# Patient Record
Sex: Male | Born: 1965 | Race: White | Hispanic: No | Marital: Married | Smoking: Former smoker
Health system: Southern US, Community
[De-identification: ages and names within clinical notes are randomized; demographics above are authoritative.]

## PROBLEM LIST (undated history)

## (undated) DIAGNOSIS — F329 Major depressive disorder, single episode, unspecified: Secondary | ICD-10-CM

## (undated) DIAGNOSIS — E119 Type 2 diabetes mellitus without complications: Secondary | ICD-10-CM

## (undated) DIAGNOSIS — I10 Essential (primary) hypertension: Secondary | ICD-10-CM

## (undated) DIAGNOSIS — M4800 Spinal stenosis, site unspecified: Secondary | ICD-10-CM

## (undated) DIAGNOSIS — F32A Depression, unspecified: Secondary | ICD-10-CM

## (undated) HISTORY — PX: CARPAL TUNNEL RELEASE: SHX101

## (undated) HISTORY — PX: TESTICLE SURGERY: SHX794

## (undated) HISTORY — PX: MENISCUS REPAIR: SHX5179

## (undated) HISTORY — PX: VASECTOMY: SHX75

---

## 2000-12-22 ENCOUNTER — Encounter: Payer: Self-pay | Admitting: *Deleted

## 2000-12-22 ENCOUNTER — Emergency Department (HOSPITAL_COMMUNITY): Admission: EM | Admit: 2000-12-22 | Discharge: 2000-12-22 | Payer: Self-pay | Admitting: *Deleted

## 2004-12-03 ENCOUNTER — Inpatient Hospital Stay (HOSPITAL_COMMUNITY): Admission: EM | Admit: 2004-12-03 | Discharge: 2004-12-05 | Payer: Self-pay | Admitting: *Deleted

## 2004-12-11 ENCOUNTER — Ambulatory Visit (HOSPITAL_COMMUNITY): Admission: RE | Admit: 2004-12-11 | Discharge: 2004-12-11 | Payer: Self-pay | Admitting: *Deleted

## 2004-12-24 ENCOUNTER — Ambulatory Visit (HOSPITAL_COMMUNITY): Admission: RE | Admit: 2004-12-24 | Discharge: 2004-12-24 | Payer: Self-pay | Admitting: *Deleted

## 2014-08-14 ENCOUNTER — Encounter (HOSPITAL_COMMUNITY): Payer: Self-pay | Admitting: *Deleted

## 2014-08-14 ENCOUNTER — Emergency Department (HOSPITAL_COMMUNITY)
Admission: EM | Admit: 2014-08-14 | Discharge: 2014-08-14 | Disposition: A | Discharge status: Home / Self Care | Payer: BC Managed Care – PPO | Attending: Emergency Medicine | Admitting: Emergency Medicine

## 2014-08-14 DIAGNOSIS — Z8739 Personal history of other diseases of the musculoskeletal system and connective tissue: Secondary | ICD-10-CM | POA: Diagnosis not present

## 2014-08-14 DIAGNOSIS — Z23 Encounter for immunization: Secondary | ICD-10-CM | POA: Insufficient documentation

## 2014-08-14 DIAGNOSIS — Y998 Other external cause status: Secondary | ICD-10-CM | POA: Insufficient documentation

## 2014-08-14 DIAGNOSIS — Z7982 Long term (current) use of aspirin: Secondary | ICD-10-CM | POA: Insufficient documentation

## 2014-08-14 DIAGNOSIS — Z87891 Personal history of nicotine dependence: Secondary | ICD-10-CM | POA: Diagnosis not present

## 2014-08-14 DIAGNOSIS — Z79899 Other long term (current) drug therapy: Secondary | ICD-10-CM | POA: Insufficient documentation

## 2014-08-14 DIAGNOSIS — E119 Type 2 diabetes mellitus without complications: Secondary | ICD-10-CM | POA: Diagnosis not present

## 2014-08-14 DIAGNOSIS — W260XXA Contact with knife, initial encounter: Secondary | ICD-10-CM | POA: Diagnosis not present

## 2014-08-14 DIAGNOSIS — F10129 Alcohol abuse with intoxication, unspecified: Secondary | ICD-10-CM | POA: Insufficient documentation

## 2014-08-14 DIAGNOSIS — S61412A Laceration without foreign body of left hand, initial encounter: Secondary | ICD-10-CM | POA: Diagnosis present

## 2014-08-14 DIAGNOSIS — Y93G1 Activity, food preparation and clean up: Secondary | ICD-10-CM | POA: Diagnosis not present

## 2014-08-14 DIAGNOSIS — Y9289 Other specified places as the place of occurrence of the external cause: Secondary | ICD-10-CM | POA: Insufficient documentation

## 2014-08-14 HISTORY — DX: Type 2 diabetes mellitus without complications: E11.9

## 2014-08-14 HISTORY — DX: Spinal stenosis, site unspecified: M48.00

## 2014-08-14 MED ORDER — LIDOCAINE HCL (PF) 2 % IJ SOLN
INTRAMUSCULAR | Status: AC
  Filled 2014-08-14: qty 10

## 2014-08-14 MED ORDER — TETANUS-DIPHTH-ACELL PERTUSSIS 5-2.5-18.5 LF-MCG/0.5 IM SUSP
0.5000 mL | Freq: Once | INTRAMUSCULAR | Status: AC
  Administered 2014-08-14: 0.5 mL via INTRAMUSCULAR
  Filled 2014-08-14: qty 0.5

## 2014-08-14 MED ORDER — CEPHALEXIN 500 MG PO CAPS: 500.0000 mg | ORAL_CAPSULE | Freq: Three times a day (TID) | ORAL | Status: DC

## 2014-08-14 NOTE — ED Provider Notes (Signed)
CSN: [id number]     Arrival date & time 08/14/14  1937 History   First MD Initiated Contact with Patient 08/14/14 2015     Chief Complaint  Patient presents with  . Laceration     (Consider location/radiation/quality/duration/timing/severity/associated sxs/prior Treatment) Patient is a 48 y.o. male presenting with skin laceration. The history is provided by the patient.  Laceration Location:  Hand Hand laceration location:  L hand Depth:  Through underlying tissue Quality: jagged   Time since incident:  1 hour Laceration mechanism:  Knife Pain details:    Quality:  Aching   Severity:  Moderate   Timing:  Constant   Progression:  Unchanged Foreign body present:  Unable to specify Relieved by:  None tried Worsened by:  Nothing tried Tetanus status:  Out of date  John Ware is a 48 y.o. male who present to the ED with with his wife for a 4 cm laceration to the left hand. He was cleaning a deer and the knife slipped and went into the space between the left thumb and index finger. Patient has been drinking alcohol tonight.   Past Medical History  Diagnosis Date  . Diabetes mellitus without complication   . Spinal stenosis    History reviewed. No pertinent past surgical history. History reviewed. No pertinent family history. History  Substance Use Topics  . Smoking status: Former Games developermoker  . Smokeless tobacco: Not on file  . Alcohol Use: Yes    Review of Systems Negative except as stated in HPI   Allergies  Review of patient's allergies indicates no known allergies.  Home Medications   Prior to Admission medications   Medication Sig Start Date End Date Taking? Authorizing Provider  amLODipine (NORVASC) 5 MG tablet Take 5 mg by mouth daily. 07/20/14  Yes Historical Provider, MD  aspirin 325 MG tablet Take 325 mg by mouth as needed for mild pain.   Yes Historical Provider, MD  buPROPion (WELLBUTRIN XL) 150 MG 24 hr tablet Take 150 mg by mouth 2 (two) times daily.  06/17/14  Yes Historical Provider, MD  citalopram (CELEXA) 20 MG tablet Take 20 mg by mouth daily. 08/01/14  Yes Historical Provider, MD  HYDROcodone-acetaminophen (NORCO) 10-325 MG per tablet Take 1 tablet by mouth 3 (three) times daily as needed for moderate pain.  07/16/14  Yes Historical Provider, MD  metFORMIN (GLUCOPHAGE) 500 MG tablet Take 500 mg by mouth 3 (three) times daily. 07/01/14  Yes Historical Provider, MD  naproxen sodium (ALEVE) 220 MG tablet Take 440 mg by mouth 2 (two) times daily as needed (Pain).   Yes Historical Provider, MD  ramipril (ALTACE) 10 MG capsule Take 10 mg by mouth daily. 07/20/14  Yes Historical Provider, MD  cephALEXin (KEFLEX) 500 MG capsule Take 1 capsule (500 mg total) by mouth 3 (three) times daily. 08/14/14   Yasmin Bronaugh Orlene OchM Kain Milosevic, NP   BP 114/81 mmHg  Pulse 87  Temp(Src) 98.2 F (36.8 C) (Oral)  Resp 20  Ht 5' 10.5" (1.791 m)  Wt 215 lb (97.523 kg)  BMI 30.40 kg/m2  SpO2 98% Physical Exam  Constitutional: He is oriented to person, place, and time. He appears well-developed and well-nourished. No distress.  HENT:  Head: Normocephalic and atraumatic.  Eyes: EOM are normal.  Neck: Neck supple.  Cardiovascular: Normal rate.   Pulmonary/Chest: Effort normal.  Abdominal: Soft. There is no tenderness.  Musculoskeletal: Normal range of motion.       Left hand: He exhibits tenderness and laceration.  He exhibits normal range of motion and normal capillary refill. Normal sensation noted. Decreased strength noted.       Hands: Deep laceration between thumb and index finger. Good strength and touch sensation, no neuro deficits.   Neurological: He is alert and oriented to person, place, and time. No cranial nerve deficit.  Skin: Skin is warm and dry.  Psychiatric:  intoxicated  Nursing note and vitals reviewed.   ED Course  Procedures  LACERATION REPAIR Performed by: Sylvain Hasten Authorized by: Heron Pitcock Consent: Verbal consent obtained. Risks and benefits:  risks, benefits and alternatives were discussed Consent given by: patient Patient identity confirmed: provided demographic data Prepped and Draped in normal sterile fashion  Soaked in NSS and Betadine  Wound explored  Laceration Location: left hand  Laceration Length: 4 cm  No Foreign Bodies seen or palpated  Anesthesia: local infiltration  Local anesthetic: lidocaine 2% without epinephrine  Anesthetic total: 3 ml  Irrigation method: syringe Amount of cleaning: standard  Skin closure: 4-0 prolene  Number of sutures: 5  Technique: interrupted  Patient tolerance: Patient tolerated the procedure well with no immediate complications.  MDM  48 y.o. male with laceration to the left hand s/p injury. Since the wound is deep will start antibiotics and he will take ibuprofen as needed for pain. Stable for discharge without neurovascular deficits.  Discussed with the patient and all questioned fully answered. He will follow up in 10 days for suture removal or sooner if any problems arise.    Medication List    TAKE these medications        cephALEXin 500 MG capsule  Commonly known as:  KEFLEX  Take 1 capsule (500 mg total) by mouth 3 (three) times daily.      ASK your doctor about these medications        ALEVE 220 MG tablet  Generic drug:  naproxen sodium  Take 440 mg by mouth 2 (two) times daily as needed (Pain).     amLODipine 5 MG tablet  Commonly known as:  NORVASC  Take 5 mg by mouth daily.     aspirin 325 MG tablet  Take 325 mg by mouth as needed for mild pain.     buPROPion 150 MG 24 hr tablet  Commonly known as:  WELLBUTRIN XL  Take 150 mg by mouth 2 (two) times daily.     citalopram 20 MG tablet  Commonly known as:  CELEXA  Take 20 mg by mouth daily.     HYDROcodone-acetaminophen 10-325 MG per tablet  Commonly known as:  NORCO  Take 1 tablet by mouth 3 (three) times daily as needed for moderate pain.     metFORMIN 500 MG tablet  Commonly known as:   GLUCOPHAGE  Take 500 mg by mouth 3 (three) times daily.     ramipril 10 MG capsule  Commonly known as:  ALTACE  Take 10 mg by mouth daily.         Final diagnoses:  Laceration of hand, left, initial encounter      [facility] Vernice Bowker, NP 08/14/14 2148  Layla MawKristen N Ward, DO 08/15/14 0022

## 2014-08-14 NOTE — ED Notes (Addendum)
Pt has a laceration to the left hand between his thumb and index finger that actually looks like he poked a whole in hand. Pt was cutting a deer. Pt staggered into triage admits to drinking tonight.

## 2014-11-24 ENCOUNTER — Other Ambulatory Visit: Payer: Self-pay | Admitting: Neurological Surgery

## 2014-12-06 NOTE — Progress Notes (Signed)
LEFT MESSAGE ON VOICEMAIL FOR JESSICA RE: ORDERS NEEDING SIGNED.

## 2014-12-06 NOTE — Pre-Procedure Instructions (Signed)
John Ware  12/06/2014   Your procedure is scheduled on:   Tuesday  12/13/14  [facility].  Call this number if you have problems the morning of surgery: [phone]   Remember:   Do not eat food or drink liquids after midnight.   Take these medicines the morning of surgery with A SIP OF WATER:   AMLODIPINE(NORVASC), BUPROPION (WELLBUTRIN), CITALOPRAM(CELEXA), OXYCONTIN IF NEEDED      (STOP NAPROXEN/ALEVE )   Do not wear jewelry, make-up or nail polish.  Do not wear lotions, powders, or perfumes. You may wear deodorant.  Do not shave 48 hours prior to surgery. Men may shave face and neck.  Do not bring valuables to the hospital.  [facility] is not responsible                  for any belongings or valuables.               Contacts, dentures or bridgework may not be worn into surgery.  Leave suitcase in the car. After surgery it may be brought to your room.  For patients admitted to the hospital, discharge time is determined by your                treatment team.               Patients discharged the day of surgery will not be allowed to drive  home.  Name and phone number of your driver:   Special Instructions: [place] - Preparing for Surgery  Before surgery, you can play an important role.  Because skin is not sterile, your skin needs to be as free of germs as possible.  You can reduce the number of germs on you skin by washing with CHG (chlorahexidine gluconate) soap before surgery.  CHG is an antiseptic cleaner which kills germs and bonds with the skin to continue killing germs even after washing.  Please DO NOT use if you have an allergy to CHG or antibacterial soaps.  If your skin becomes reddened/irritated stop using the CHG and inform your nurse when you arrive at Short Stay.  Do not shave (including legs and underarms) for at least 48 hours prior to the first CHG shower.  You may shave your face.  Please follow these  instructions carefully:   1.  Shower with CHG Soap the night before surgery and the                                morning of Surgery.  2.  If you choose to wash your hair, wash your hair first as usual with your       normal shampoo.  3.  After you shampoo, rinse your hair and body thoroughly to remove the                      Shampoo.  4.  Use CHG as you would any other liquid soap.  You can apply chg directly       to the skin and wash gently with scrungie or a clean washcloth.  5.  Apply the CHG Soap to your body ONLY FROM THE NECK DOWN.        Do not use on open wounds or open sores.  Avoid contact with your eyes,       ears, mouth  and genitals (private parts).  Wash genitals (private parts)       with your normal soap.  6.  Wash thoroughly, paying special attention to the area where your surgery        will be performed.  7.  Thoroughly rinse your body with warm water from the neck down.  8.  DO NOT shower/wash with your normal soap after using and rinsing off       the CHG Soap.  9.  Pat yourself dry with a clean towel.            10.  Wear clean pajamas.            11.  Place clean sheets on your bed the night of your first shower and do not        sleep with pets.  Day of Surgery  Do not apply any lotions/deoderants the morning of surgery.  Please wear clean clothes to the hospital/surgery center.     Please read over the following fact sheets that you were given: Pain Booklet, Coughing and Deep Breathing, MRSA Information and Surgical Site Infection Prevention

## 2014-12-07 ENCOUNTER — Encounter (HOSPITAL_COMMUNITY): Payer: Self-pay

## 2014-12-07 ENCOUNTER — Encounter (HOSPITAL_COMMUNITY)
Admission: RE | Admit: 2014-12-07 | Discharge: 2014-12-07 | Disposition: A | Discharge status: Home / Self Care | Payer: BLUE CROSS/BLUE SHIELD | Source: Ambulatory Visit | Attending: Neurological Surgery | Admitting: Neurological Surgery

## 2014-12-07 ENCOUNTER — Other Ambulatory Visit: Payer: Self-pay

## 2014-12-07 DIAGNOSIS — Z01812 Encounter for preprocedural laboratory examination: Secondary | ICD-10-CM | POA: Diagnosis not present

## 2014-12-07 DIAGNOSIS — Z01810 Encounter for preprocedural cardiovascular examination: Secondary | ICD-10-CM | POA: Diagnosis not present

## 2014-12-07 HISTORY — DX: Depression, unspecified: F32.A

## 2014-12-07 HISTORY — DX: Major depressive disorder, single episode, unspecified: F32.9

## 2014-12-07 HISTORY — DX: Essential (primary) hypertension: I10

## 2014-12-07 LAB — CBC
HCT: 42.9 % (ref 39.0–52.0)
Hemoglobin: 14.7 g/dL (ref 13.0–17.0)
MCH: 30.9 pg (ref 26.0–34.0)
MCHC: 34.3 g/dL (ref 30.0–36.0)
MCV: 90.3 fL (ref 78.0–100.0)
Platelets: 235 10*3/uL (ref 150–400)
RBC: 4.75 MIL/uL (ref 4.22–5.81)
RDW: 12.3 % (ref 11.5–15.5)
WBC: 6.9 10*3/uL (ref 4.0–10.5)

## 2014-12-07 LAB — SURGICAL PCR SCREEN
MRSA, PCR: NEGATIVE
Staphylococcus aureus: POSITIVE — AB

## 2014-12-07 LAB — BASIC METABOLIC PANEL
Anion gap: 10 (ref 5–15)
BUN: 15 mg/dL (ref 6–23)
CO2: 29 mmol/L (ref 19–32)
Calcium: 9.4 mg/dL (ref 8.4–10.5)
Chloride: 98 mmol/L (ref 96–112)
Creatinine, Ser: 0.93 mg/dL (ref 0.50–1.35)
GFR calc Af Amer: >90 mL/min (ref >90)
GFR calc non Af Amer: >90 mL/min (ref >90)
Glucose, Bld: 139 mg/dL — ABNORMAL HIGH (ref 70–99)
Potassium: 4.4 mmol/L (ref 3.5–5.1)
Sodium: 137 mmol/L (ref 135–145)

## 2014-12-07 NOTE — Progress Notes (Signed)
LEFT MESSAGE ON ANSWERING MACHINE FOR PATIENT TO CALL BACK.  CALLED [facility]. INTO [facility].  [phone]

## 2014-12-08 NOTE — Progress Notes (Signed)
Anesthesia Chart Review:  Patient is a 49 year old male scheduled for L3-4, L4-5, L5-S1 laminectomy on 12/13/14 by Dr. Danielle DessElsner.  History includes former smoker, DM2, HTN, depression, spinal stenosis. BMI is consistent with obesity.  PCP is listed as Prudy FeelerAngel Jones, PA-C.  12/07/14 EKG: NSR, poor r wave progression, possible anterior infarct (age undetermined). There are no comparison EKGs. (Patient denied prior EKG.) No CV symptoms documented from his PAT visit.  Preoperative labs noted.   If no acute changes then I would anticipate that he could proceed as planned.  Velna Ochsllison Angela Vazguez, PA-C [facility]iology Phone [phone] 12/08/2014 10:08 AM

## 2014-12-12 MED ORDER — CEFAZOLIN SODIUM-DEXTROSE 2-3 GM-% IV SOLR
2.0000 g | INTRAVENOUS | Status: AC
  Administered 2014-12-13: 2 g via INTRAVENOUS
  Filled 2014-12-12: qty 50

## 2014-12-13 ENCOUNTER — Encounter (HOSPITAL_COMMUNITY): Payer: Self-pay | Admitting: *Deleted

## 2014-12-13 ENCOUNTER — Encounter (HOSPITAL_COMMUNITY)
Admission: RE | Disposition: A | Discharge status: Home / Self Care | Payer: Self-pay | Source: Ambulatory Visit | Attending: Neurological Surgery

## 2014-12-13 ENCOUNTER — Inpatient Hospital Stay (HOSPITAL_COMMUNITY): Payer: BLUE CROSS/BLUE SHIELD | Admitting: Vascular Surgery

## 2014-12-13 ENCOUNTER — Observation Stay (HOSPITAL_COMMUNITY)
Admission: RE | Admit: 2014-12-13 | Discharge: 2014-12-14 | Disposition: A | Discharge status: Home / Self Care | Payer: BLUE CROSS/BLUE SHIELD | Source: Ambulatory Visit | Attending: Neurological Surgery | Admitting: Neurological Surgery

## 2014-12-13 ENCOUNTER — Inpatient Hospital Stay (HOSPITAL_COMMUNITY): Payer: BLUE CROSS/BLUE SHIELD | Admitting: Anesthesiology

## 2014-12-13 ENCOUNTER — Inpatient Hospital Stay (HOSPITAL_COMMUNITY): Payer: BLUE CROSS/BLUE SHIELD

## 2014-12-13 DIAGNOSIS — F329 Major depressive disorder, single episode, unspecified: Secondary | ICD-10-CM | POA: Insufficient documentation

## 2014-12-13 DIAGNOSIS — M48061 Spinal stenosis, lumbar region without neurogenic claudication: Secondary | ICD-10-CM | POA: Diagnosis present

## 2014-12-13 DIAGNOSIS — Z87891 Personal history of nicotine dependence: Secondary | ICD-10-CM | POA: Diagnosis not present

## 2014-12-13 DIAGNOSIS — Z6833 Body mass index (BMI) 33.0-33.9, adult: Secondary | ICD-10-CM | POA: Diagnosis not present

## 2014-12-13 DIAGNOSIS — E119 Type 2 diabetes mellitus without complications: Secondary | ICD-10-CM | POA: Diagnosis not present

## 2014-12-13 DIAGNOSIS — I10 Essential (primary) hypertension: Secondary | ICD-10-CM | POA: Insufficient documentation

## 2014-12-13 DIAGNOSIS — M5126 Other intervertebral disc displacement, lumbar region: Secondary | ICD-10-CM

## 2014-12-13 DIAGNOSIS — M4726 Other spondylosis with radiculopathy, lumbar region: Principal | ICD-10-CM | POA: Insufficient documentation

## 2014-12-13 HISTORY — PX: LUMBAR LAMINECTOMY/DECOMPRESSION MICRODISCECTOMY: SHX5026

## 2014-12-13 LAB — GLUCOSE, CAPILLARY
Glucose-Capillary: 133 mg/dL — ABNORMAL HIGH (ref 70–99)
Glucose-Capillary: 122 mg/dL — ABNORMAL HIGH (ref 70–99)
Glucose-Capillary: 142 mg/dL — ABNORMAL HIGH (ref 70–99)
Glucose-Capillary: 151 mg/dL — ABNORMAL HIGH (ref 70–99)

## 2014-12-13 SURGERY — LUMBAR LAMINECTOMY/DECOMPRESSION MICRODISCECTOMY 3 LEVELS
Anesthesia: General | Site: Spine Lumbar

## 2014-12-13 MED ORDER — KETOROLAC TROMETHAMINE 30 MG/ML IJ SOLN
INTRAMUSCULAR | Status: AC
  Filled 2014-12-13: qty 1

## 2014-12-13 MED ORDER — GLYCOPYRROLATE 0.2 MG/ML IJ SOLN
INTRAMUSCULAR | Status: DC | PRN
  Administered 2014-12-13: 0.4 mg via INTRAVENOUS
  Administered 2014-12-13: 0.2 mg via INTRAVENOUS

## 2014-12-13 MED ORDER — CEFAZOLIN SODIUM 1-5 GM-% IV SOLN
1.0000 g | Freq: Three times a day (TID) | INTRAVENOUS | Status: AC
  Administered 2014-12-13 – 2014-12-14 (×2): 1 g via INTRAVENOUS
  Filled 2014-12-13 (×2): qty 50

## 2014-12-13 MED ORDER — OXYCODONE HCL 5 MG PO TABS
ORAL_TABLET | ORAL | Status: AC
  Filled 2014-12-13: qty 1

## 2014-12-13 MED ORDER — LACTATED RINGERS IV SOLN
INTRAVENOUS | Status: DC
  Administered 2014-12-13: 10:00:00 via INTRAVENOUS

## 2014-12-13 MED ORDER — FLEET ENEMA 7-19 GM/118ML RE ENEM
1.0000 | ENEMA | Freq: Once | RECTAL | Status: AC | PRN
  Filled 2014-12-13: qty 1

## 2014-12-13 MED ORDER — MENTHOL 3 MG MT LOZG: 1.0000 | LOZENGE | OROMUCOSAL | Status: DC | PRN

## 2014-12-13 MED ORDER — KETOROLAC TROMETHAMINE 15 MG/ML IJ SOLN
15.0000 mg | Freq: Four times a day (QID) | INTRAMUSCULAR | Status: DC
  Administered 2014-12-13 – 2014-12-14 (×3): 15 mg via INTRAVENOUS
  Filled 2014-12-13 (×4): qty 1

## 2014-12-13 MED ORDER — SODIUM CHLORIDE 0.9 % IR SOLN
Status: DC | PRN
  Administered 2014-12-13: 13:00:00

## 2014-12-13 MED ORDER — CITALOPRAM HYDROBROMIDE 20 MG PO TABS
20.0000 mg | ORAL_TABLET | Freq: Every day | ORAL | Status: DC
  Filled 2014-12-13: qty 1

## 2014-12-13 MED ORDER — GLYCOPYRROLATE 0.2 MG/ML IJ SOLN
INTRAMUSCULAR | Status: AC
  Filled 2014-12-13: qty 1

## 2014-12-13 MED ORDER — POLYETHYLENE GLYCOL 3350 17 G PO PACK
17.0000 g | PACK | Freq: Every day | ORAL | Status: DC | PRN
  Filled 2014-12-13: qty 1

## 2014-12-13 MED ORDER — LIDOCAINE HCL (CARDIAC) 20 MG/ML IV SOLN
INTRAVENOUS | Status: DC | PRN
  Administered 2014-12-13: 120 mg via INTRAVENOUS

## 2014-12-13 MED ORDER — PHENOL 1.4 % MT LIQD: 1.0000 | OROMUCOSAL | Status: DC | PRN

## 2014-12-13 MED ORDER — OXYCODONE-ACETAMINOPHEN 5-325 MG PO TABS
1.0000 | ORAL_TABLET | ORAL | Status: DC | PRN
  Administered 2014-12-13 – 2014-12-14 (×4): 2 via ORAL
  Filled 2014-12-13 (×4): qty 2

## 2014-12-13 MED ORDER — ONDANSETRON HCL 4 MG/2ML IJ SOLN
INTRAMUSCULAR | Status: DC | PRN
  Administered 2014-12-13: 4 mg via INTRAVENOUS

## 2014-12-13 MED ORDER — THROMBIN 5000 UNITS EX SOLR
CUTANEOUS | Status: DC | PRN
  Administered 2014-12-13 (×2): 5000 [IU] via TOPICAL

## 2014-12-13 MED ORDER — ROCURONIUM BROMIDE 100 MG/10ML IV SOLN
INTRAVENOUS | Status: DC | PRN
  Administered 2014-12-13: 50 mg via INTRAVENOUS

## 2014-12-13 MED ORDER — METFORMIN HCL 500 MG PO TABS
500.0000 mg | ORAL_TABLET | Freq: Three times a day (TID) | ORAL | Status: DC
  Administered 2014-12-13 – 2014-12-14 (×2): 500 mg via ORAL
  Filled 2014-12-13 (×5): qty 1

## 2014-12-13 MED ORDER — OXYCODONE HCL 5 MG/5ML PO SOLN: 5.0000 mg | Freq: Once | ORAL | Status: AC | PRN

## 2014-12-13 MED ORDER — ROCURONIUM BROMIDE 50 MG/5ML IV SOLN
INTRAVENOUS | Status: AC
  Filled 2014-12-13: qty 1

## 2014-12-13 MED ORDER — SODIUM CHLORIDE 0.9 % IJ SOLN
3.0000 mL | Freq: Two times a day (BID) | INTRAMUSCULAR | Status: DC
  Administered 2014-12-13: 3 mL via INTRAVENOUS

## 2014-12-13 MED ORDER — FENTANYL CITRATE (PF) 100 MCG/2ML IJ SOLN
INTRAMUSCULAR | Status: DC | PRN
  Administered 2014-12-13: 50 ug via INTRAVENOUS
  Administered 2014-12-13: 75 ug via INTRAVENOUS
  Administered 2014-12-13 (×2): 50 ug via INTRAVENOUS
  Administered 2014-12-13: 25 ug via INTRAVENOUS

## 2014-12-13 MED ORDER — HYDROMORPHONE HCL 1 MG/ML IJ SOLN
INTRAMUSCULAR | Status: AC
  Filled 2014-12-13: qty 1

## 2014-12-13 MED ORDER — OXYCODONE HCL 5 MG PO TABS: 10.0000 mg | ORAL_TABLET | Freq: Four times a day (QID) | ORAL | Status: DC | PRN

## 2014-12-13 MED ORDER — MIDAZOLAM HCL 5 MG/5ML IJ SOLN
INTRAMUSCULAR | Status: DC | PRN
  Administered 2014-12-13: 2 mg via INTRAVENOUS

## 2014-12-13 MED ORDER — ALUM & MAG HYDROXIDE-SIMETH 200-200-20 MG/5ML PO SUSP: 30.0000 mL | Freq: Four times a day (QID) | ORAL | Status: DC | PRN

## 2014-12-13 MED ORDER — SODIUM CHLORIDE 0.9 % IJ SOLN
INTRAMUSCULAR | Status: AC
  Filled 2014-12-13: qty 10

## 2014-12-13 MED ORDER — PROPOFOL 10 MG/ML IV BOLUS
INTRAVENOUS | Status: AC
  Filled 2014-12-13: qty 20

## 2014-12-13 MED ORDER — PROMETHAZINE HCL 25 MG/ML IJ SOLN: 6.2500 mg | INTRAMUSCULAR | Status: DC | PRN

## 2014-12-13 MED ORDER — PROPOFOL 10 MG/ML IV BOLUS
INTRAVENOUS | Status: DC | PRN
  Administered 2014-12-13: 200 mg via INTRAVENOUS

## 2014-12-13 MED ORDER — 0.9 % SODIUM CHLORIDE (POUR BTL) OPTIME
TOPICAL | Status: DC | PRN
  Administered 2014-12-13: 1000 mL

## 2014-12-13 MED ORDER — AMLODIPINE BESYLATE 5 MG PO TABS
5.0000 mg | ORAL_TABLET | Freq: Every day | ORAL | Status: DC
  Administered 2014-12-13: 5 mg via ORAL
  Filled 2014-12-13 (×2): qty 1

## 2014-12-13 MED ORDER — SENNA 8.6 MG PO TABS
1.0000 | ORAL_TABLET | Freq: Two times a day (BID) | ORAL | Status: DC
  Administered 2014-12-13: 8.6 mg via ORAL
  Filled 2014-12-13 (×3): qty 1

## 2014-12-13 MED ORDER — RAMIPRIL 10 MG PO CAPS
10.0000 mg | ORAL_CAPSULE | Freq: Every day | ORAL | Status: DC
  Administered 2014-12-13: 10 mg via ORAL
  Filled 2014-12-13 (×2): qty 1

## 2014-12-13 MED ORDER — LACTATED RINGERS IV SOLN
INTRAVENOUS | Status: DC | PRN
  Administered 2014-12-13 (×2): via INTRAVENOUS

## 2014-12-13 MED ORDER — ONDANSETRON HCL 4 MG/2ML IJ SOLN: 4.0000 mg | INTRAMUSCULAR | Status: DC | PRN

## 2014-12-13 MED ORDER — ACETAMINOPHEN 325 MG PO TABS: 650.0000 mg | ORAL_TABLET | ORAL | Status: DC | PRN

## 2014-12-13 MED ORDER — THROMBIN 5000 UNITS EX SOLR
OROMUCOSAL | Status: DC | PRN
  Administered 2014-12-13: 14:00:00 via TOPICAL

## 2014-12-13 MED ORDER — BUPROPION HCL ER (XL) 150 MG PO TB24
150.0000 mg | ORAL_TABLET | Freq: Two times a day (BID) | ORAL | Status: DC
  Administered 2014-12-13: 150 mg via ORAL
  Filled 2014-12-13 (×3): qty 1

## 2014-12-13 MED ORDER — CYCLOBENZAPRINE HCL 10 MG PO TABS
10.0000 mg | ORAL_TABLET | Freq: Three times a day (TID) | ORAL | Status: DC | PRN
  Administered 2014-12-13: 10 mg via ORAL
  Filled 2014-12-13: qty 1

## 2014-12-13 MED ORDER — LIDOCAINE-EPINEPHRINE 1 %-1:100000 IJ SOLN
INTRAMUSCULAR | Status: DC | PRN
  Administered 2014-12-13: 5 mL

## 2014-12-13 MED ORDER — FENTANYL CITRATE (PF) 250 MCG/5ML IJ SOLN
INTRAMUSCULAR | Status: AC
  Filled 2014-12-13: qty 5

## 2014-12-13 MED ORDER — KETOROLAC TROMETHAMINE 30 MG/ML IJ SOLN
INTRAMUSCULAR | Status: DC | PRN
  Administered 2014-12-13: 30 mg via INTRAVENOUS

## 2014-12-13 MED ORDER — EPHEDRINE SULFATE 50 MG/ML IJ SOLN
INTRAMUSCULAR | Status: DC | PRN
  Administered 2014-12-13: 10 mg via INTRAVENOUS

## 2014-12-13 MED ORDER — ONDANSETRON HCL 4 MG/2ML IJ SOLN
INTRAMUSCULAR | Status: AC
  Filled 2014-12-13: qty 2

## 2014-12-13 MED ORDER — SODIUM CHLORIDE 0.9 % IJ SOLN: 3.0000 mL | INTRAMUSCULAR | Status: DC | PRN

## 2014-12-13 MED ORDER — OXYCODONE HCL ER 10 MG PO T12A: 10.0000 mg | EXTENDED_RELEASE_TABLET | Freq: Four times a day (QID) | ORAL | Status: DC | PRN

## 2014-12-13 MED ORDER — SODIUM CHLORIDE 0.9 % IV SOLN: 250.0000 mL | INTRAVENOUS | Status: DC

## 2014-12-13 MED ORDER — MIDAZOLAM HCL 2 MG/2ML IJ SOLN
INTRAMUSCULAR | Status: AC
Start: 2014-12-13 — End: 2014-12-13
  Filled 2014-12-13: qty 2

## 2014-12-13 MED ORDER — PIOGLITAZONE HCL 30 MG PO TABS
30.0000 mg | ORAL_TABLET | Freq: Every day | ORAL | Status: DC
  Administered 2014-12-14: 30 mg via ORAL
  Filled 2014-12-13 (×2): qty 1

## 2014-12-13 MED ORDER — ACETAMINOPHEN 650 MG RE SUPP: 650.0000 mg | RECTAL | Status: DC | PRN

## 2014-12-13 MED ORDER — NEOSTIGMINE METHYLSULFATE 10 MG/10ML IV SOLN
INTRAVENOUS | Status: DC | PRN
  Administered 2014-12-13: 3 mg via INTRAVENOUS

## 2014-12-13 MED ORDER — HYDROMORPHONE HCL 1 MG/ML IJ SOLN
0.5000 mg | INTRAMUSCULAR | Status: DC | PRN
  Administered 2014-12-13: 1 mg via INTRAVENOUS
  Filled 2014-12-13: qty 1

## 2014-12-13 MED ORDER — HEMOSTATIC AGENTS (NO CHARGE) OPTIME
TOPICAL | Status: DC | PRN
  Administered 2014-12-13: 1 via TOPICAL

## 2014-12-13 MED ORDER — BUPIVACAINE HCL (PF) 0.5 % IJ SOLN
INTRAMUSCULAR | Status: DC | PRN
  Administered 2014-12-13: 5 mL

## 2014-12-13 MED ORDER — LIDOCAINE HCL (CARDIAC) 20 MG/ML IV SOLN
INTRAVENOUS | Status: AC
  Filled 2014-12-13: qty 5

## 2014-12-13 MED ORDER — EPHEDRINE SULFATE 50 MG/ML IJ SOLN
INTRAMUSCULAR | Status: AC
  Filled 2014-12-13: qty 1

## 2014-12-13 MED ORDER — DOCUSATE SODIUM 100 MG PO CAPS
100.0000 mg | ORAL_CAPSULE | Freq: Two times a day (BID) | ORAL | Status: DC
  Administered 2014-12-13: 100 mg via ORAL
  Filled 2014-12-13: qty 1

## 2014-12-13 MED ORDER — DIAZEPAM 5 MG PO TABS
5.0000 mg | ORAL_TABLET | Freq: Four times a day (QID) | ORAL | Status: DC | PRN
  Administered 2014-12-13 – 2014-12-14 (×2): 5 mg via ORAL
  Filled 2014-12-13: qty 1

## 2014-12-13 MED ORDER — DIAZEPAM 5 MG PO TABS
ORAL_TABLET | ORAL | Status: AC
  Filled 2014-12-13: qty 1

## 2014-12-13 MED ORDER — BISACODYL 10 MG RE SUPP: 10.0000 mg | Freq: Every day | RECTAL | Status: DC | PRN

## 2014-12-13 MED ORDER — PHENYLEPHRINE HCL 10 MG/ML IJ SOLN
INTRAMUSCULAR | Status: DC | PRN
  Administered 2014-12-13: 40 ug via INTRAVENOUS

## 2014-12-13 MED ORDER — HYDROMORPHONE HCL 1 MG/ML IJ SOLN
0.2500 mg | INTRAMUSCULAR | Status: DC | PRN
  Administered 2014-12-13 (×4): 0.5 mg via INTRAVENOUS

## 2014-12-13 MED ORDER — OXYCODONE HCL 5 MG PO TABS
5.0000 mg | ORAL_TABLET | Freq: Once | ORAL | Status: AC | PRN
  Administered 2014-12-13: 5 mg via ORAL

## 2014-12-13 SURGICAL SUPPLY — 54 items
ADH SKN CLS APL DERMABOND .7 (GAUZE/BANDAGES/DRESSINGS) ×1
BAG DECANTER FOR FLEXI CONT (MISCELLANEOUS) ×2 IMPLANT
BLADE CLIPPER SURG (BLADE) ×2 IMPLANT
BUR ACORN 6.0 (BURR) IMPLANT
BUR MATCHSTICK NEURO 3.0 LAGG (BURR) ×2 IMPLANT
CANISTER SUCT 3000ML PPV (MISCELLANEOUS) ×2 IMPLANT
CONT SPEC 4OZ CLIKSEAL STRL BL (MISCELLANEOUS) ×2 IMPLANT
DECANTER SPIKE VIAL GLASS SM (MISCELLANEOUS) ×1 IMPLANT
DERMABOND ADVANCED (GAUZE/BANDAGES/DRESSINGS) ×1
DERMABOND ADVANCED .7 DNX12 (GAUZE/BANDAGES/DRESSINGS) IMPLANT
DRAPE LAPAROTOMY 100X72X124 (DRAPES) ×2 IMPLANT
DRAPE MICROSCOPE LEICA (MISCELLANEOUS) IMPLANT
DRAPE POUCH INSTRU U-SHP 10X18 (DRAPES) ×2 IMPLANT
DRAPE PROXIMA HALF (DRAPES) ×1 IMPLANT
DRSG OPSITE POSTOP 4X6 (GAUZE/BANDAGES/DRESSINGS) ×1 IMPLANT
DURAPREP 26ML APPLICATOR (WOUND CARE) ×2 IMPLANT
ELECT BLADE 4.0 EZ CLEAN MEGAD (MISCELLANEOUS) ×2
ELECT REM PT RETURN 9FT ADLT (ELECTROSURGICAL) ×2
ELECTRODE BLDE 4.0 EZ CLN MEGD (MISCELLANEOUS) ×1 IMPLANT
ELECTRODE REM PT RTRN 9FT ADLT (ELECTROSURGICAL) ×1 IMPLANT
GAUZE SPONGE 4X4 12PLY STRL (GAUZE/BANDAGES/DRESSINGS) ×1 IMPLANT
GAUZE SPONGE 4X4 16PLY XRAY LF (GAUZE/BANDAGES/DRESSINGS) IMPLANT
GLOVE BIOGEL PI IND STRL 8.5 (GLOVE) ×1 IMPLANT
GLOVE BIOGEL PI INDICATOR 8.5 (GLOVE) ×2
GLOVE ECLIPSE 7.0 STRL STRAW (GLOVE) ×2 IMPLANT
GLOVE ECLIPSE 7.5 STRL STRAW (GLOVE) ×2 IMPLANT
GLOVE ECLIPSE 8.5 STRL (GLOVE) ×3 IMPLANT
GLOVE ECLIPSE 9.0 STRL (GLOVE) ×4 IMPLANT
GOWN STRL REUS W/ TWL LRG LVL3 (GOWN DISPOSABLE) IMPLANT
GOWN STRL REUS W/ TWL XL LVL3 (GOWN DISPOSABLE) IMPLANT
GOWN STRL REUS W/TWL 2XL LVL3 (GOWN DISPOSABLE) ×2 IMPLANT
GOWN STRL REUS W/TWL LRG LVL3 (GOWN DISPOSABLE) ×2
GOWN STRL REUS W/TWL XL LVL3 (GOWN DISPOSABLE) ×6
HEMOSTAT POWDER KIT SURGIFOAM (HEMOSTASIS) ×1 IMPLANT
KIT BASIN OR (CUSTOM PROCEDURE TRAY) ×2 IMPLANT
KIT ROOM TURNOVER OR (KITS) ×2 IMPLANT
LIQUID BAND (GAUZE/BANDAGES/DRESSINGS) ×1 IMPLANT
NDL SPNL 20GX3.5 QUINCKE YW (NEEDLE) IMPLANT
NEEDLE HYPO 22GX1.5 SAFETY (NEEDLE) ×2 IMPLANT
NEEDLE SPNL 20GX3.5 QUINCKE YW (NEEDLE) ×2 IMPLANT
NS IRRIG 1000ML POUR BTL (IV SOLUTION) ×2 IMPLANT
PACK LAMINECTOMY NEURO (CUSTOM PROCEDURE TRAY) ×2 IMPLANT
PAD ARMBOARD 7.5X6 YLW CONV (MISCELLANEOUS) ×6 IMPLANT
PATTIES SURGICAL .5 X1 (DISPOSABLE) ×2 IMPLANT
RUBBERBAND STERILE (MISCELLANEOUS) IMPLANT
SPONGE SURGIFOAM ABS GEL SZ50 (HEMOSTASIS) ×2 IMPLANT
SUT VIC AB 1 CT1 18XBRD ANBCTR (SUTURE) ×1 IMPLANT
SUT VIC AB 1 CT1 8-18 (SUTURE) ×2
SUT VIC AB 2-0 CP2 18 (SUTURE) ×2 IMPLANT
SUT VIC AB 3-0 SH 8-18 (SUTURE) ×2 IMPLANT
SYR 20ML ECCENTRIC (SYRINGE) ×2 IMPLANT
TOWEL OR 17X24 6PK STRL BLUE (TOWEL DISPOSABLE) ×2 IMPLANT
TOWEL OR 17X26 10 PK STRL BLUE (TOWEL DISPOSABLE) ×2 IMPLANT
WATER STERILE IRR 1000ML POUR (IV SOLUTION) ×2 IMPLANT

## 2014-12-13 NOTE — Anesthesia Postprocedure Evaluation (Signed)
  Anesthesia Post-op Note  Patient: John Ware  Procedure(s) Performed: Procedure(s) with comments: LUMBAR THREE-FOUR, LUMBAR FOUR-FIVE, LUMBAR FIVE-SACRAL ONE LAMINECTOMY (N/A) - L3-4 L4-5 L5-S1 Laminectomy  Patient Location: PACU  Anesthesia Type:General  Level of Consciousness: awake and alert   Airway and Oxygen Therapy: Patient Spontanous Breathing  Post-op Pain: mild  Post-op Assessment: Post-op Vital signs reviewed, Patient's Cardiovascular Status Stable and Respiratory Function Stable  Post-op Vital Signs: Reviewed  Filed Vitals:   12/13/14 1630  BP:   Pulse: 70  Temp:   Resp: 20    Complications: No apparent anesthesia complications

## 2014-12-13 NOTE — Progress Notes (Signed)
Patient ID: [name], male   DOB: 12/01/1965, 49 y.o.   MRN: [id number] Vital signs are stable Motor function is intact Dressing dry

## 2014-12-13 NOTE — H&P (Signed)
CHIEF COMPLAINT:                                          Spinal stenosis.    HISTORY OF PRESENT ILLNESS:                     John Ware is a 49 year old left-handed individual whom I had seen a number of years ago.  At that time, he had some modest spondylosis and I suggested that he be treated conservatively.  He notes that for the last 15 years or so he has been having some degree of back pain.  He more recently, in the last seven or eight months, has developed some weakness into both his legs from the region below the knees and to the feet.  He finds that his stamina on his feet has decreased substantially and it has increased problems with increased pain, increased discomfort in both his back and his lower extremities.  He has been treated conservatively for this process by Dr. Samuel Jesterynthia Butler and he notes that his PA is Prudy FeelerAngel Jones.  He has been managed with narcotic analgesic in the form of hydrocodone 10/325 taking about three per day for a number of years.  More recently, it has become somewhat refractory and he notes that his stamina on his feet has just gotten to the point where he cannot stand or walk for more than 10 or 15 minutes at a time.    DATA:                                                              An MRI was ordered and he brings the study with him.  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  He also has areas of severe stenosis  at L3-4 and L4-5, in addition to a moderately severe stenosis  at L5-S1.  L2-3 has mild stenosis centrally and some slight lateral recess stenosis.  He has moderately severe right and left lateral recess stenosis  at L3-4 and L4-5.    REVIEW OF SYSTEMS:                                    His bowel and bladder control has remained intact throughout all this process.  Systems review is notable for wearing of glasses, nasal  congestion and drainage, high blood pressure, leg pain while walking, leg weakness, back pain, arthritis, some depression, diabetes and difficulty with starting a urinary stream is noted on a 14-point review sheet.      PAST MEDICAL HISTORY:                                His past medical history reveals that his general health has been good.  He does have some diabetes and high blood pressure.    . Medications and Allergies:  His current medications include the hydrocodone as noted previously, ramipril, amlodipine, besylate, metformin,  citalopram, and bupropion.  He notes no allergies to any medications.    PERSONAL HISTORY:                                      He does not smoke.  He drinks some alcohol on a social basis.  Height and weight have been stable at 5 feet 11 inches, 220 pounds.    PHYSICAL EXAMINATION:                                On examination, I note that he stands straight and erect, although with his normal gait he tends to favor a 10-degree forward stoop.  His motor function appears intact in the iliopsoas, quads, tibialis anterior and the gastrocs, albeit his reflexes are absent in the Achilles bilaterally.  His patellar reflexes are tract.  Straight leg raising noted is positive at 15 degrees bilaterally.  [name]'s maneuver is noted to be negative bilaterally.  Sensation appears grossly intact into the distal lower extremities.     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  He has been experiencing increasingly worsening symptoms of neurogenic claudication.  This was predicted some 10 to 15 years ago, as he notes my previous intervention with him I noted that he would be back and ultimately require some surgical intervention for his back.  It seems that this process has now come to pass.  To further his workup today in the  office, I obtained an AP and lateral, flexion/extension radiograph of the lumbar spine.  This reveals the presence of diffuse idiopathic skeletal hyperostosis in addition to a 10-degree scoliosis measured between L1 and L3.    I noted the issues for [facility] with his back.  I do believe that he will require surgery in the near future for the degree and severity of the stenosis  noted at L3-4, L4-5, but I also believe that L5-S1 should be included in surgery to decompress the spine using bilateral laminotomies and foraminotomies to open up the interspinous space at the three levels involved, 3-4, 4-5 and 5-1. Marland Kitchen  The surgery itself was discussed with John Ware and his wife.  I noted that we will proceed with planning decompression of the spinal stenosis, as this process is only likely to continue to worsen  with some time.  I also noted that we would have to keep an eye on his scoliosis .  If this should worsen, he may ultimately need fusion surgery for that process, but given the nature of the process in his back at this time, I believe that a simple decompression  would be his best option.  He is admitted for surgery today.Marland Kitchen

## 2014-12-13 NOTE — Transfer of Care (Signed)
Immediate Anesthesia Transfer of Care Note  Patient: John Ware  Procedure(s) Performed: Procedure(s) with comments: LUMBAR THREE-FOUR, LUMBAR FOUR-FIVE, LUMBAR FIVE-SACRAL ONE LAMINECTOMY (N/A) - L3-4 L4-5 L5-S1 Laminectomy  Patient Location: PACU  Anesthesia Type:General  Level of Consciousness: awake, alert  and oriented  Airway & Oxygen Therapy: Patient Spontanous Breathing and Patient connected to nasal cannula oxygen  Post-op Assessment: Report given to RN and Post -op Vital signs reviewed and stable  Post vital signs: Reviewed and stable  Last Vitals:  Filed Vitals:   12/13/14 0850  BP: 125/95  Pulse: 66  Temp: 36.7 C  Resp: 20    Complications: No apparent anesthesia complications

## 2014-12-13 NOTE — Anesthesia Procedure Notes (Addendum)
Procedure Name: Intubation Date/Time: 12/13/2014 12:17 PM Performed by: Minerva EndsMIRARCHI, ANGELA M Pre-anesthesia Checklist: Patient identified, Timeout performed, Emergency Drugs available, Suction available and Patient being monitored Patient Re-evaluated:Patient Re-evaluated prior to inductionOxygen Delivery Method: Circle system utilized Preoxygenation: Pre-oxygenation with 100% oxygen Intubation Type: IV induction Ventilation: Mask ventilation without difficulty Laryngoscope Size: Miller and 2 Grade View: Grade I Tube type: Oral Tube size: 7.5 mm Number of attempts: 1 Airway Equipment and Method: Stylet Placement Confirmation: breath sounds checked- equal and bilateral,  ETT inserted through vocal cords under direct vision and positive ETCO2 Secured at: 22 cm Tube secured with: Tape Dental Injury: Teeth and Oropharynx as per pre-operative assessment  Comments: Iv induction Fitgerald- intubation AM CRNA- atraumatic- teeth and mouth as preop- Bilat BS Sampson GoonFitzgerald

## 2014-12-13 NOTE — Op Note (Signed)
Date of surgery: 12/13/2014 Preoperative diagnosis: Lumbar spondylosis and lumbar stenosis L3-4 L4-5 L5-S1, neurogenic claudication, lumbar radiculopathy Postoperative diagnosis: Lumbar spondylosis and lumbar stenosis L3-4 L4-5 L5-S1, neurogenic claudication, lumbar radiculopathy Procedure: Bilateral laminotomies and foraminotomies L3-4 L4-5 and L5-S1 decompression of L3 L4 L5 and S1 nerve roots. Surgeon: Barnett AbuHenry Johnpaul Gillentine Asst.: Julio SicksHenry Pool M.D. Anesthesia: Gen. endotracheal Indications: John Ware is a 49 year old individual who's had significant back and bilateral lower extremity pain is evidence of significant spondylitic stenosis at L3-4 L4-5 and L5-S1. L3-4 has severe central and lateral recess stenosis as does L4-5 but L5-S1 has primarily lateral recess stenosis. Is now taken to the operating room to undergo surgical decompression.  Procedure: The patient was brought to the operating room supine on a stretcher. After the smooth induction of general endotracheal anesthesia, he was turned prone. The back was prepped with alcohol DuraPrep and draped in a sterile fashion. A midline incision was created and carried down to the lumbar dorsal fascia. Fascia was opened on either side of the midline to expose the spinous processes of L4-L5 and L3 superiorly. The dissection was then carried out to expose the interlaminar spaces at L3-4 L4-5 and L5-S1. Radiographic confirmation of the appropriate spaces was obtained. Then laminotomies were carried out removing the inferior marginal lamina of L4 first and doing a partial facetectomy at L4-5. Superiorly the path of the L4 nerve root was found and decompressed with 2 and 3 mm straight and curved Kerrison punch. Inferiorly the path the L5 nerve root was decompressed with a foraminotomies at that level. This procedure was done bilaterally at L4-5 next the procedure was completed at L3-4 with similar series of tools used to decompress the L3 nerve root superiorly and the  L4 nerve roots inferiorly area finally L5-S1 was performed in a similar fashion with wide lateral recesses that were decompressed. Once this was accomplished hemostasis was verified at all levels in each of the individual nerve roots were sounded and found to be free and clear. No dural incursions were noted. With the decompression being completed the wound was inspected carefully and the lumbar dorsal fascia was closed with #1 Vicryl in interrupted fashion, 2-0 Vicryl subcutaneous tissue, 3-0 Vicryl subcuticularly. Dermabond was used on the skin. Blood loss is estimated at 150 mL for this procedure

## 2014-12-13 NOTE — Anesthesia Preprocedure Evaluation (Addendum)
Anesthesia Evaluation  Patient identified by MRN, date of birth, ID band Patient awake    Reviewed: Allergy & Precautions, NPO status , Patient's Chart, lab work & pertinent test results  Airway Mallampati: II  TM Distance: >3 FB Neck ROM: Full    Dental  (+) Teeth Intact, Dental Advisory Given   Pulmonary former smoker,  breath sounds clear to auscultation        Cardiovascular Exercise Tolerance: Good METS: > 9 Mets hypertension, Pt. on medications Rhythm:Regular Rate:Normal     Neuro/Psych Depression negative neurological ROS     GI/Hepatic negative GI ROS, Neg liver ROS,   Endo/Other  diabetes, Type 2, Oral Hypoglycemic AgentsMorbid obesity  Renal/GU negative Renal ROS     Musculoskeletal   Abdominal   Peds  Hematology negative hematology ROS (+)   Anesthesia Other Findings   Reproductive/Obstetrics                            Anesthesia Physical Anesthesia Plan  ASA: II  Anesthesia Plan: General   Post-op Pain Management:    Induction: Intravenous  Airway Management Planned: Oral ETT  Additional Equipment:   Intra-op Plan:   Post-operative Plan: Extubation in OR  Informed Consent: I have reviewed the patients History and Physical, chart, labs and discussed the procedure including the risks, benefits and alternatives for the proposed anesthesia with the patient or authorized representative who has indicated his/her understanding and acceptance.   Dental advisory given  Plan Discussed with: CRNA  Anesthesia Plan Comments:         Anesthesia Quick Evaluation

## 2014-12-14 ENCOUNTER — Encounter (HOSPITAL_COMMUNITY): Payer: Self-pay | Admitting: Neurological Surgery

## 2014-12-14 DIAGNOSIS — M4726 Other spondylosis with radiculopathy, lumbar region: Secondary | ICD-10-CM | POA: Diagnosis not present

## 2014-12-14 LAB — GLUCOSE, CAPILLARY: Glucose-Capillary: 130 mg/dL — ABNORMAL HIGH (ref 70–99)

## 2014-12-14 MED ORDER — OXYCODONE-ACETAMINOPHEN 5-325 MG PO TABS: 1.0000 | ORAL_TABLET | ORAL | Status: DC | PRN

## 2014-12-14 MED ORDER — DIAZEPAM 5 MG PO TABS: 5.0000 mg | ORAL_TABLET | Freq: Four times a day (QID) | ORAL | Status: DC | PRN

## 2014-12-14 NOTE — Evaluation (Signed)
Occupational Therapy Evaluation Patient Details Name: John Ware MRN: [id number] DOB: 08/18/1966 Today's Date: 12/14/2014    History of Present Illness Bilateral laminotomies and foraminotomies L3-4 L4-5 and L5-S1 decompression of L3 L4 L5 and S1 nerve roots   Clinical Impression   Pt admitted with the above diagnoses. PTA pt was independent with ADLs. Pt currently at mod I to supervision level for LB ADLs and transfers. ADL education completed and ADLs performed as detailed below. NO further OT needs indicated at this time.   Follow Up Recommendations  Supervision - Intermittent;No OT follow up    Equipment Recommendations  None recommended by OT    Recommendations for Other Services       Precautions / Restrictions Precautions Precautions: Back Precaution Booklet Issued: Yes (comment) Precaution Comments: reviewed Restrictions Weight Bearing Restrictions: No      Mobility Bed Mobility Overal bed mobility: Modified Independent             General bed mobility comments: very good log roll technique  Transfers Overall transfer level: Modified independent Equipment used: None             General transfer comment: practiced from EOB at lowest height setting to simulate toilet at hime    Balance Overall balance assessment: No apparent balance deficits (not formally assessed)                                          ADL Overall ADL's : Needs assistance/impaired Eating/Feeding: Set up;Sitting   Grooming: Set up;Sitting   Upper Body Bathing: Set up;Sitting   Lower Body Bathing: Supervison/ safety;Sit to/from stand;With adaptive equipment   Upper Body Dressing : Set up;Sitting   Lower Body Dressing: Supervision/safety;Sit to/from stand;With adaptive equipment   Toilet Transfer: Supervision/safety;Ambulation;Comfort height toilet;Grab bars   Toileting- Clothing Manipulation and Hygiene: Supervision/safety;With adaptive  equipment;Sit to/from stand   Tub/ Shower Transfer: Supervision/safety;Ambulation;Shower seat   Functional mobility during ADLs: Supervision/safety General ADL Comments: Education on technique and AE for completion of ADLs with back precautions. Pt completed household distance ambulation, toilet transfer, and log rolling technique at supervision level. Discussed home setup.     Vision     Perception     Praxis      Pertinent Vitals/Pain Pain Assessment: 0-10 Pain Score: 5  Pain Location: back Pain Descriptors / Indicators: Aching;Operative site guarding Pain Intervention(s): Monitored during session;Repositioned     Hand Dominance     Extremity/Trunk Assessment Upper Extremity Assessment Upper Extremity Assessment: Overall WFL for tasks assessed   Lower Extremity Assessment Lower Extremity Assessment: Defer to PT evaluation   Cervical / Trunk Assessment Cervical / Trunk Assessment:  (Good observation of precautions)   Communication Communication Communication: No difficulties   Cognition Arousal/Alertness: Awake/alert Behavior During Therapy: WFL for tasks assessed/performed Overall Cognitive Status: Within Functional Limits for tasks assessed                     General Comments       Exercises       Shoulder Instructions      Home Living Family/patient expects to be discharged to:: Private residence Living Arrangements: Spouse/significant other Available Help at Discharge: Family;Available PRN/intermittently Type of Home: House Home Access: Stairs to enter Entergy CorporationEntrance Stairs-Number of Steps: 2 Entrance Stairs-Rails: Left Home Layout: One level     Bathroom Shower/Tub: Walk-in shower  Bathroom Toilet: Standard     Home Equipment: None   Additional Comments: pt plans to borrow shower seat from family      Prior Functioning/Environment Level of Independence: Independent             OT Diagnosis: Acute pain   OT Problem List:      OT Treatment/Interventions:      OT Goals(Current goals can be found in the care plan section) Acute Rehab OT Goals Patient Stated Goal: less back pain  OT Frequency:     Barriers to D/C:            Co-evaluation              End of Session    Activity Tolerance: Patient tolerated treatment well Patient left: in bed;with call bell/phone within reach   Time: 0903-0915 OT Time Calculation (min): 12 min Charges:  OT General Charges $OT Visit: 1 Procedure OT Evaluation $Initial OT Evaluation Tier I: 1 Procedure G-Codes: OT G-codes **NOT FOR INPATIENT CLASS** Functional Assessment Tool Used: clinical judgement Functional Limitation: Self care Self Care Current Status (N8295): At least 1 percent but less than 20 percent impaired, limited or restricted  John Ware 12/14/2014, 10:07 AM

## 2014-12-14 NOTE — Discharge Instructions (Signed)
Laminectomy - Laminotomy - Discectomy  Your surgeon has decided that a laminectomy (entire lamina removal) or laminotomy (partial lamina removal) is the best treatment for your back problem. These procedures involve removal of bone to relieve pressure on nerve roots. It allows the surgeon access to parts of the spine where other problems are located. This could be an injured disc (the cartilage-like structures located between the bones of the back). In this surgery your surgeon removes a part of the boney arch that surrounds your spinal canal. This may be compressing nerve roots. In some cases, the surgeon will remove the disc and fuse (stick together) vertebral bodies (the bones of your back) to make the spine more stable. The type of procedure you will need is usually decided prior to surgery, however modifications may be necessary. The time in surgery depends on the findings in surgery and the procedure necessary to correct the problems.  DISCECTOMY  For people with disc problems, the surgeon removes the portion of the disc that is causing the pressure on the nerve root. Some surgeons perform a micro (small) discectomy, which may require removal of only a small portion of the lamina. A disc nucleus (center) may also be removed either through a needle (percutaneous discectomy) or by injecting an enzyme called chymopapain into the disc. Chymopapain is an enzyme that dissolves the disc. For people with back instability, the surgeon fuses vertebrae that are next to each other with tiny pieces of bone. These are used as bone grafts on the facets, or between the vertebrae. When this heals, the bones will no longer be able to move. These bone chips are often taken from the pelvic bones. Bones and bone grafts grow into one unit, stabilizing the segments of the spinal column.  LET YOUR CAREGIVER KNOW ABOUT:  · Allergies.  · Medicines taken including herbs, eyedrops, over-the-counter medicines, and creams.  · Use of  steroids (by mouth or creams).  · Previous problems with anesthetics or numbing medicine.  · Possibility of pregnancy, if this applies.  · History of blood clots (thrombophlebitis).  · History of bleeding or blood problems.  · Previous surgery.  · Other health problems.  RISKS AND COMPLICATIONS  Your caregiver will discuss possible risks and complications with you before surgery. In addition to the usual risks of anesthesia, other common risks and complications include:  · Blood loss and replacement.  · Temporary increase in pain due to surgery.  · Uncorrected back pain.  · Infection.  · New nerve damage (tingling, numbness, and pain).  BEFORE THE PROCEDURE  · Stop smoking at least 1 week prior to surgery. This lowers risk during surgery.  · Your caregiver may advise that you stop taking certain medicines that may affect the outcome of the surgery and your ability to heal. For example, you may need to stop taking anti-inflammatories, such as aspirin, because of possible bleeding problems. Other medicines may have interactions with anesthesia.  · Tell your caregiver if you have been on steroids for long periods of time. Often, additional steroids are administered intravenously before and during the procedure to prevent complications.  · You should be present 60 minutes prior to your procedure or as directed.  AFTER THE PROCEDURE  After surgery, you will be taken to the recovery area where a nurse will watch and check your progress. Generally, you will be allowed to go home within 1 week barring other problems.  HOME CARE INSTRUCTIONS   · Check the surgical cut (  incision) twice a day for signs of infection. Some signs may include a bad smelling, greenish or yellowish discharge from the wound; increased pain or increased redness over the incision site; an opening of the incision; flu-like symptoms; or a temperature above 101.5° F (38.6° C).  · Change your bandages in about 24 to 36 hours following surgery or as  directed.  · You may shower once the bandage is removed or as directed. Avoid bathtubs, swimming pools, and hot tubs for 3 weeks or until your incision has healed completely. If you have stitches (sutures) or staples they may be removed 2 to 3 weeks after surgery, or as directed by your caregiver.  · Follow your caregiver's instructions for activities, exercises, and physical therapy.  · Weight reduction may be beneficial if you are overweight.  · Walking is permitted. You may use a treadmill without an incline. Cut down on activities if you have discomfort. You may also go up and down stairs as tolerated.  · Do not lift anything heavier than 10 to 15 pounds. Avoid bending or twisting at the waist. Always bend your knees.  · Maintain strength and range of motion as instructed.  · No driving is permitted for 2 to 3 weeks, or as directed by your caregiver. You may be a passenger for 20 to 30 minute trips. Lying back in the passenger seat may be more comfortable for you.  · Limit your sitting to 20 to 30 minute intervals. You should lie down or walk in between sitting periods. There are no limitations for sitting in a recliner chair.  · Only take over-the-counter or prescription medicines for pain, discomfort, or fever as directed by your caregiver.  SEEK MEDICAL CARE IF:   · There is increased bleeding (more than a small spot) from the wound.  · You notice redness, swelling, or increasing pain in the wound.  · Pus is coming from the wound.  · An unexplained oral temperature above 102° F (38.9° C) develops.  · You notice a bad smell coming from the wound or dressing.  SEEK IMMEDIATE MEDICAL CARE IF:   · You develop a rash.  · You have difficulty breathing.  · You have any allergic problems.  Document Released: 08/09/2000 Document Revised: 12/27/2013 Document Reviewed: 06/07/2008  Exit[employer]® Patient Information ©2015 [employer]. This information is not intended to replace advice given to you by your health care  provider. Make sure you discuss any questions you have with your health care provider.

## 2014-12-14 NOTE — Progress Notes (Addendum)
Pt doing well. Pt and wife given D/C instructions with Rx's, verbal understanding was provided. Pt's incision is clean and dry with no sign of infection. Pt's IV was removed prior to D/C. Pt D/C'd home via walking @ 1020 per MD order. Pt is stable @ D/C and has no other needs at this time. Destin Vinsant, RN  

## 2014-12-14 NOTE — Evaluation (Signed)
Physical Therapy Evaluation Patient Details Name: John Ware MRN: [id number] DOB: 06-11-1966 Today's Date: 12/14/2014   History of Present Illness  Bilateral laminotomies and foraminotomies L3-4 L4-5 and L5-S1 decompression of L3 L4 L5 and S1 nerve roots  Clinical Impression  Patient evaluated by Physical Therapy with no further acute PT needs identified. All education has been completed and the patient has no further questions.  See below for any follow-up Physical Therapy or equipment needs. PT is signing off. Thank you for this referral.     Follow Up Recommendations Outpatient PT  Possible benefit of Outpatient PT can be addressed at follow-up visits with Dr. Ellene Route    Equipment Recommendations  None recommended by PT    Recommendations for Other Services       Precautions / Restrictions Precautions Precautions: Back (for comfort) Precaution Booklet Issued: Yes (comment) Restrictions Weight Bearing Restrictions: No      Mobility  Bed Mobility Overal bed mobility: Modified Independent             General bed mobility comments: very good log roll technique  Transfers Overall transfer level: Modified independent Equipment used: None             General transfer comment: NIce, straight back  Ambulation/Gait Ambulation/Gait assistance: Modified independent (Device/Increase time) Ambulation Distance (Feet): 200 Feet Assistive device: None Gait Pattern/deviations: Step-through pattern     General Gait Details: Holding trunk stiff, to be expected postop; slow, but steady gait  Stairs Stairs: Yes Stairs assistance: Modified independent (Device/Increase time) Stair Management: One rail Left;Step to pattern;Forwards Number of Stairs: 2 General stair comments: Steady; showed good balance turning to come down on the one step  Wheelchair Mobility    Modified Rankin (Stroke Patients Only)       Balance Overall balance assessment: No apparent  balance deficits (not formally assessed)                                           Pertinent Vitals/Pain Pain Assessment: 0-10 Pain Score: 4  Pain Location: Back; much imporved from preop Pain Descriptors / Indicators: Aching;Operative site guarding Pain Intervention(s): Monitored during session    Home Living Family/patient expects to be discharged to:: Private residence Living Arrangements: Spouse/significant other Available Help at Discharge: Family;Available PRN/intermittently Type of Home: House Home Access: Stairs to enter Entrance Stairs-Rails: Left Entrance Stairs-Number of Steps: 2 Home Layout: One level Home Equipment: None      Prior Function Level of Independence: Independent               Hand Dominance        Extremity/Trunk Assessment   Upper Extremity Assessment: Overall WFL for tasks assessed           Lower Extremity Assessment: Overall WFL for tasks assessed      Cervical / Trunk Assessment:  (Good observation of precautions)  Communication   Communication: No difficulties  Cognition Arousal/Alertness: Awake/alert Behavior During Therapy: WFL for tasks assessed/performed Overall Cognitive Status: Within Functional Limits for tasks assessed                      General Comments      Exercises        Assessment/Plan    PT Assessment All further PT needs can be met in the next venue of care  PT Diagnosis Acute  pain   PT Problem List Decreased strength;Decreased range of motion;Decreased activity tolerance;Decreased knowledge of precautions  PT Treatment Interventions     PT Goals (Current goals can be found in the Care Plan section) Acute Rehab PT Goals Patient Stated Goal: less back pain PT Goal Formulation: All assessment and education complete, DC therapy    Frequency     Barriers to discharge        Co-evaluation               End of Session   Activity Tolerance: Patient tolerated  treatment well Patient left: in bed;with call bell/phone within reach (sitting EOB for breakfast) Nurse Communication: Mobility status    Functional Assessment Tool Used: Clinical Judgement Functional Limitation: Mobility: Walking and moving around Mobility: Walking and Moving Around Current Status (E2683): 0 percent impaired, limited or restricted Mobility: Walking and Moving Around Goal Status [phone]): 0 percent impaired, limited or restricted Mobility: Walking and Moving Around Discharge Status [phone]): 0 percent impaired, limited or restricted    Time: 8921-1941 PT Time Calculation (min) (ACUTE ONLY): 8 min   Charges:   PT Evaluation $Initial PT Evaluation Tier I: 1 Procedure     PT G Codes:   PT G-Codes **NOT FOR INPATIENT CLASS** Functional Assessment Tool Used: Clinical Judgement Functional Limitation: Mobility: Walking and moving around Mobility: Walking and Moving Around Current Status (D4081): 0 percent impaired, limited or restricted Mobility: Walking and Moving Around Goal Status (K4818): 0 percent impaired, limited or restricted Mobility: Walking and Moving Around Discharge Status (H6314): 0 percent impaired, limited or restricted    Roney Marion [facility] 12/14/2014, 8:49 AM  Roney Marion, [place] Pager [phone] Office [phone]

## 2014-12-14 NOTE — Discharge Summary (Signed)
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 Diabetes mellitus Discharge and final diagnosis: Lumbar spondylosis and stenosis L3-4 L4-5 L5-S1. Lumbar radiculopathy. Neurogenic claudication. Diabetes mellitus. Condition on discharge: Improved Medications on discharge: Percocet 5 mg #60 no refills, Valium 5 mg #60 no refills, Hospital course: Patient was admitted for surgery which he tolerated well. His neurologic exam remains intact. His motor function is good in lower extremities. Station and gait is intact. His incision is clean and dry. Follow-up with outpatient office visit in 3 weeks' time

## 2015-01-16 ENCOUNTER — Ambulatory Visit: Payer: BLUE CROSS/BLUE SHIELD | Attending: Neurological Surgery | Admitting: Physical Therapy

## 2015-01-16 DIAGNOSIS — M545 Low back pain, unspecified: Secondary | ICD-10-CM

## 2015-01-16 NOTE — Therapy (Signed)
[facility] [address] [place], [place], [address] Phone: [phone]   Fax:  [phone]  Physical Therapy Evaluation  Patient Details  Name: John Ware MRN: [id number] Date of Birth: 31-Dec-1965 Referring Provider:  [name], MD  Encounter Date: 01/16/2015      PT End of Session - 01/16/15 1020    Visit Number 1   Number of Visits 12   Date for PT Re-Evaluation 02/27/15   PT Start Time 0958   PT Stop Time 1038   PT Time Calculation (min) 40 min   Activity Tolerance Patient tolerated treatment well   Behavior During Therapy [facility] for tasks assessed/performed      Past Medical History  Diagnosis Date  . Diabetes mellitus without complication   . Spinal stenosis   . Hypertension   . Depression     Past Surgical History  Procedure Laterality Date  . 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 Vasectomy    . Lumbar laminectomy/decompression microdiscectomy N/A 12/13/2014    Procedure: LUMBAR THREE-FOUR, LUMBAR FOUR-FIVE, LUMBAR FIVE-SACRAL ONE LAMINECTOMY;  Surgeon: [name], MD;  Location: [facility];  Service: Neurosurgery;  Laterality: N/A;  L3-4 L4-5 L5-S1 Laminectomy    There were no vitals filed for this visit.  Visit Diagnosis:  Bilateral low back pain without sciatica - Plan: PT plan of care cert/re-cert      Subjective Assessment - 01/16/15 1017    Subjective The patient is very pleased with his surgical outcome.  Surgeon told me not to bend.   Limitations House hold activities   Patient Stated Goals "Recover from surgery."   Currently in Pain? Yes   Pain Score 4    Pain Location Back   Pain Orientation Mid   Pain Descriptors / Indicators Aching   Pain Type Surgical pain   Pain Onset More than a month ago   Aggravating Factors  Certain movement.   Pain Relieving Factors Medication.   Effect of Pain on Daily Activities Limited ability to perform ADL's.            [facility] PT  Assessment - 01/16/15 0001    Assessment   Medical Diagnosis Lumbar spondylosis   Onset Date/Surgical Date --  12/13/14.   Precautions   Precaution Comments Spinal movement contraindications.   Restrictions   Weight Bearing Restrictions No   Balance Screen   Has the patient fallen in the past 6 months Yes   How many times? 3   Has the patient had a decrease in activity level because of a fear of falling?  No   Is the patient reluctant to leave their home because of a fear of falling?  No   Home Environment   Living Environment Private residence   Prior Function   Level of Independence Independent   Posture/Postural Control   Posture Comments Decreased lumbar lordosis.   ROM / Strength   AROM / PROM / Strength AROM;Strength   AROM   Overall AROM Comments Left SKTC= 105 degrees and right= 112 degrees.   Strength   Overall Strength Comments Normal bilateral knee and ankle strength.   Palpation   Palpation comment Tender to palpation on either side of the patient's lumbar incisonal site and especially at the distal aspect of the incisional site.   Ambulation/Gait   Ambulation/Gait --  Normal gait cycle.  [facility]/Exercise - 01/16/15 0001    Modalities   Modalities Electrical Stimulation   Electrical Stimulation   Electrical Stimulation Location Low back.   Electrical Stimulation Action 80-150HZ  constant Pre-Mod E'Stim x 15 minutes.                     PT Long Term Goals - 01/16/15 1027    PT LONG TERM GOAL #1   Title Ind with HEP.   Time 6   Period Weeks   Status New   PT LONG TERM GOAL #2   Title Perform ADL's with pain not > 2-3/10.   PT LONG TERM GOAL #3   Title Walk a community distance with pain not > 2-3/10.               Plan - 01/16/15 1021    Clinical Impression Statement The patient underwent a lumbar surgery on 12/13/14.  He is very pleased with his outcome thus far as he states it has  essentially eliminated his left LE pain.  He has been walking at home and this helps. He staes his surgeon has told him to avoid bending, twisting and lifting anything over 5 pounds at this time.  His current pain-level is rate at a 3-4/10.   Pt will benefit from skilled therapeutic intervention in order to improve on the following deficits Pain;Decreased activity tolerance   Rehab Potential Excellent   PT Frequency 3x / week   PT Duration 4 weeks   PT Treatment/Interventions ADLs/Self Care Home Management;Electrical Stimulation;Moist Heat;Ultrasound;Therapeutic activities;Therapeutic exercise   PT Next Visit Plan Draw-ins; SKTC; Nustep and modalities to low back region.   Consulted and Agree with Plan of Care Patient         Problem List Patient Active Problem List   Diagnosis Date Noted  . Lumbar stenosis 12/13/2014    Tijuan Dantes, [place] MPT 01/16/2015, 10:38 AM  [facility] [address] [place], [place], [address] Phone: [phone]   Fax:  [phone]

## 2015-01-18 ENCOUNTER — Encounter: Payer: Self-pay | Admitting: Physical Therapy

## 2015-01-18 ENCOUNTER — Ambulatory Visit: Payer: BLUE CROSS/BLUE SHIELD | Admitting: Physical Therapy

## 2015-01-18 DIAGNOSIS — M545 Low back pain, unspecified: Secondary | ICD-10-CM

## 2015-01-18 NOTE — Therapy (Signed)
[facility] [address] [place], [place], [address] Phone: [phone]   Fax:  [phone]  Physical Therapy Treatment  Patient Details  Name: John Ware MRN: [id number] Date of Birth: 12/13/1965 Referring Provider:  Remus LofflerJones, Angel S, PA-C  Encounter Date: 01/18/2015      PT End of Session - 01/18/15 1207    Visit Number 2   Number of Visits 12   Date for PT Re-Evaluation 02/27/15   PT Start Time 1128   PT Stop Time 1223   PT Time Calculation (min) 55 min   Activity Tolerance Patient tolerated treatment well   Behavior During Therapy [facility] for tasks assessed/performed      Past Medical History  Diagnosis Date  . Diabetes mellitus without complication   . Spinal stenosis   . Hypertension   . Depression     Past Surgical History  Procedure Laterality Date  . 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 Vasectomy    . Lumbar laminectomy/decompression microdiscectomy N/A 12/13/2014    Procedure: LUMBAR THREE-FOUR, LUMBAR FOUR-FIVE, LUMBAR FIVE-SACRAL ONE LAMINECTOMY;  Surgeon: Barnett AbuHenry Elsner, MD;  Location: [facility];  Service: Neurosurgery;  Laterality: N/A;  L3-4 L4-5 L5-S1 Laminectomy    There were no vitals filed for this visit.  Visit Diagnosis:  Bilateral low back pain without sciatica      Subjective Assessment - 01/18/15 1132    Subjective some minor pain in back today   Limitations House hold activities   Patient Stated Goals "Recover from surgery."   Currently in Pain? Yes   Pain Score 2    Pain Location Back   Pain Orientation Lower   Pain Descriptors / Indicators Aching   Pain Type Surgical pain   Pain Onset More than a month ago   Pain Frequency Intermittent   Aggravating Factors  walk to much   Pain Relieving Factors rest                         [facility] Adult PT Treatment/Exercise - 01/18/15 0001    Exercises   Exercises Lumbar   Lumbar Exercises: Aerobic   Stationary Bike Nustep L4 x 10min with posture/core activation   Lumbar Exercises: Supine   Ab Set 20 reps;3 seconds   Bent Knee Raise 20 reps;3 seconds   Bridge 3 seconds;20 reps   Straight Leg Raise 20 reps;3 seconds   Electrical Stimulation   Electrical Stimulation Location Low back.   Electrical Stimulation Action premod   Electrical Stimulation Parameters 1-10hz    Electrical Stimulation Goals Pain                PT Education - 01/18/15 1145    Education provided Yes   Education Details HEP draw ins/core activation, posture techniques   Person(s) Educated Patient   Methods Explanation;Demonstration   Comprehension Verbalized understanding;Returned demonstration             PT Long Term Goals - 01/18/15 1216    PT LONG TERM GOAL #1   Title Ind with HEP.   Time 6   Period Weeks   Status On-going   PT LONG TERM GOAL #2   Title Perform ADL's with pain not > 2-3/10.   Time 6   Period Weeks   Status On-going   PT LONG TERM GOAL #3   Title Walk a community distance with pain not > 2-3/10.   Time 6  Period Weeks   Status On-going               Plan - 01/18/15 1209    Clinical Impression Statement patient tolerated treatment well today. patient has good understanding of posture awareness techniques and core strengthening activities. HEP given today. goals ongoing.   Pt will benefit from skilled therapeutic intervention in order to improve on the following deficits Pain;Decreased activity tolerance   Rehab Potential Excellent   PT Frequency 3x / week   PT Duration 4 weeks   PT Treatment/Interventions ADLs/Self Care Home Management;Electrical Stimulation;Moist Heat;Ultrasound;Therapeutic activities;Therapeutic exercise   PT Next Visit Plan Draw-ins; SKTC; Nustep and modalities to low back region.        Problem List Patient Active Problem List   Diagnosis Date Noted  . Lumbar stenosis 12/13/2014    Hermelinda Dellen, PTA 01/18/2015, 12:28  PM  [facility] [address] [place], [place], [address] Phone: [phone]   Fax:  [phone]

## 2015-01-18 NOTE — Patient Instructions (Signed)
Pelvic Tilt: Posterior - Legs Bent (Supine)   Tighten stomach and flatten back by rolling pelvis down. Hold _10___ seconds. Relax. Repeat _10-30___ times per set. Do __2__ sets per session. Do _2___ sessions per day.  http://orth.exer.us/202   Copyright  VHI. All rights reserved.  Bridging   Slowly raise buttocks from floor, keeping stomach tight. Repeat _10___ times per set. Do __2__ sets per session. Do __2__ sessions per day.  http://orth.exer.us/1096   Copyright  VHI. All rights reserved.  Straight Leg Raise   Tighten stomach and slowly raise locked right leg __4__ inches from floor. Repeat __10-30__ times per set. Do __2__ sets per session. Do __2__ sessions per day.    

## 2015-01-25 ENCOUNTER — Ambulatory Visit: Payer: BLUE CROSS/BLUE SHIELD | Attending: Neurological Surgery | Admitting: Physical Therapy

## 2015-01-25 ENCOUNTER — Encounter: Payer: Self-pay | Admitting: Physical Therapy

## 2015-01-25 DIAGNOSIS — M545 Low back pain, unspecified: Secondary | ICD-10-CM

## 2015-01-25 NOTE — Therapy (Signed)
[facility] [address] [place], [place], [address] Phone: [phone]   Fax:  [phone]  Physical Therapy Treatment  Patient Details  Name: John OharaMartie L Littman MRN: [id number] Date of Birth: 03/14/1966 Referring Provider:  Remus LofflerJones, Angel S, PA-C  Encounter Date: 01/25/2015      PT End of Session - 01/25/15 1059    Visit Number 3   Number of Visits 12   Date for PT Re-Evaluation 02/27/15   PT Start Time 1029   PT Stop Time 1115   PT Time Calculation (min) 46 min   Activity Tolerance Patient tolerated treatment well   Behavior During Therapy [facility] for tasks assessed/performed      Past Medical History  Diagnosis Date  . Diabetes mellitus without complication   . Spinal stenosis   . Hypertension   . Depression     Past Surgical History  Procedure Laterality Date  . 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 Vasectomy    . Lumbar laminectomy/decompression microdiscectomy N/A 12/13/2014    Procedure: LUMBAR THREE-FOUR, LUMBAR FOUR-FIVE, LUMBAR FIVE-SACRAL ONE LAMINECTOMY;  Surgeon: Barnett AbuHenry Elsner, MD;  Location: [facility];  Service: Neurosurgery;  Laterality: N/A;  L3-4 L4-5 L5-S1 Laminectomy    There were no vitals filed for this visit.  Visit Diagnosis:  Bilateral low back pain without sciatica      Subjective Assessment - 01/25/15 1034    Subjective sore after falling over a piece of wood outside, fell directly on back and tailbone   Limitations House hold activities   Patient Stated Goals "Recover from surgery."   Currently in Pain? Yes   Pain Score 4    Pain Location Back   Pain Orientation Lower   Pain Descriptors / Indicators Aching   Pain Type Surgical pain   Pain Onset More than a month ago   Pain Frequency Intermittent   Aggravating Factors  increased activity   Pain Relieving Factors rest                         [facility] Adult PT Treatment/Exercise - 01/25/15 0001    Lumbar  Exercises: Aerobic   Stationary Bike Nustep L4 x 15min with posture/core activation   Lumbar Exercises: Supine   Ab Set 20 reps;3 seconds   Bent Knee Raise 20 reps;3 seconds   Bridge 3 seconds;20 reps   Straight Leg Raise 20 reps;3 seconds   Electrical Stimulation   Electrical Stimulation Location Low back.   Electrical Stimulation Action premod   Electrical Stimulation Parameters 1-10HZ    Electrical Stimulation Goals Pain                     PT Long Term Goals - 01/18/15 1216    PT LONG TERM GOAL #1   Title Ind with HEP.   Time 6   Period Weeks   Status On-going   PT LONG TERM GOAL #2   Title Perform ADL's with pain not > 2-3/10.   Time 6   Period Weeks   Status On-going   PT LONG TERM GOAL #3   Title Walk a community distance with pain not > 2-3/10.   Time 6   Period Weeks   Status On-going               Plan - 01/25/15 1100    Clinical Impression Statement patient tolerated treatment well today and had no pain  increase with exercises. patient had a fall at home that effected lower back and not surgical site. patient understands importance of core activation and is doing HEP.goals ongoing due to limitations with pain level /activity   Pt will benefit from skilled therapeutic intervention in order to improve on the following deficits Pain;Decreased activity tolerance   Rehab Potential Excellent   PT Frequency 3x / week   PT Duration 4 weeks   PT Treatment/Interventions ADLs/Self Care Home Management;Electrical Stimulation;Moist Heat;Ultrasound;Therapeutic activities;Therapeutic exercise   PT Next Visit Plan Draw-ins; SKTC; Nustep and add standing core activation according to pain level/ may consider Ultrasound (MD 02/01/15 [name])   Consulted and Agree with Plan of Care Patient        Problem List Patient Active Problem List   Diagnosis Date Noted  . Lumbar stenosis 12/13/2014    Fenna Semel P, PTA 01/25/2015, 11:22 AM  [facility] [address] [place], [place], [address] Phone: [phone]   Fax:  [phone]

## 2015-01-27 ENCOUNTER — Encounter: Payer: Self-pay | Admitting: Physical Therapy

## 2015-01-27 ENCOUNTER — Ambulatory Visit: Payer: BLUE CROSS/BLUE SHIELD | Admitting: Physical Therapy

## 2015-01-27 DIAGNOSIS — M545 Low back pain, unspecified: Secondary | ICD-10-CM

## 2015-01-27 NOTE — Therapy (Signed)
[facility] [place], [place], [address] Phone: [phone]   Fax:  [phone]  Physical Therapy Treatment  Patient Details  Name: CAIDON FOTI MRN: [id number] Date of Birth: 01/28/1966 Referring Provider:  Terald Sleeper, PA-C  Encounter Date: 01/27/2015      PT End of Session - 01/27/15 1109    Visit Number 4   Number of Visits 12   Date for PT Re-Evaluation 02/27/15   PT Start Time 8250   PT Stop Time 1127   PT Time Calculation (min) 52 min   Activity Tolerance Patient tolerated treatment well   Behavior During Therapy [facility] for tasks assessed/performed      Past Medical History  Diagnosis Date  . Diabetes mellitus without complication   . Spinal stenosis   . Hypertension   . Depression     Past Surgical History  Procedure Laterality Date  . 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 Vasectomy    . Lumbar laminectomy/decompression microdiscectomy N/A 12/13/2014    Procedure: LUMBAR THREE-FOUR, LUMBAR FOUR-FIVE, LUMBAR FIVE-SACRAL ONE LAMINECTOMY;  Surgeon: Kristeen Miss, MD;  Location: [place] [facility];  Service: Neurosurgery;  Laterality: N/A;  L3-4 L4-5 L5-S1 Laminectomy    There were no vitals filed for this visit.  Visit Diagnosis:  Bilateral low back pain without sciatica      Subjective Assessment - 01/27/15 1040    Subjective feeling much better today with minimal pain   Limitations House hold activities   Patient Stated Goals "Recover from surgery."   Currently in Pain? Yes   Pain Score 1    Pain Location Back   Pain Orientation Right;Left;Mid   Pain Descriptors / Indicators Aching   Pain Type Surgical pain   Pain Onset More than a month ago   Pain Frequency Intermittent   Aggravating Factors  increased activity   Pain Relieving Factors rest                         [facility] Adult PT Treatment/Exercise - 01/27/15 0001    Lumbar Exercises: Aerobic   Stationary  Bike Nustep L4 x 66mn with posture/core activation   Lumbar Exercises: Standing   Other Standing Lumbar Exercises Pink XTS for scap retractins and ext 2x10 each   Other Standing Lumbar Exercises 2# step out and D1/D2 with core activated 2x10 each   Lumbar Exercises: Supine   Bridge 3 seconds;20 reps   Straight Leg Raise 20 reps;3 seconds   Other Supine Lumbar Exercises hip bridge with straight leg on blue swiss ball for core stabilization   Electrical Stimulation   Electrical Stimulation Location Low back.   Electrical Stimulation Action premod   Electrical Stimulation Parameters 1-_0    Electrical Stimulation Goals Pain                     PT Long Term Goals - 01/27/15 1110    PT LONG TERM GOAL #1   Title Ind with HEP.   Time 6   Period Weeks   Status On-going   PT LONG TERM GOAL #2   Title Perform ADL's with pain not > 2-3/10.   Time 6   Period Weeks   Status On-going   PT LONG TERM GOAL #3   Title Walk a community distance with pain not > 2-3/10.   Time 6   Period Weeks   Status Achieved  Plan - 01/27/15 1111    Clinical Impression Statement patient continues to progress with all activities and is reported less pain overall. patient able to walk a community distance with 1/10 pain. patient has no antalgic gait pattern. Met LTG # 3 today.   Pt will benefit from skilled therapeutic intervention in order to improve on the following deficits Pain;Decreased activity tolerance   Rehab Potential Excellent   PT Frequency 3x / week   PT Duration 4 weeks   PT Treatment/Interventions ADLs/Self Care Home Management;Electrical Stimulation;Moist Heat;Ultrasound;Therapeutic activities;Therapeutic exercise   PT Next Visit Plan Cont with POC ( MD note next visit Elsner 02/01/15)   PT Home Exercise Plan issue HEP for retractions with t-band   Consulted and Agree with Plan of Care Patient        Problem List Patient Active Problem List   Diagnosis  Date Noted  . Lumbar stenosis 12/13/2014    Shaneece Stockburger P, PTA 01/27/2015, 11:30 AM  [facility] [address] [place], [place], [address] Phone: [phone]   Fax:  [phone]

## 2015-01-30 ENCOUNTER — Ambulatory Visit: Payer: BLUE CROSS/BLUE SHIELD | Admitting: Physical Therapy

## 2015-01-30 ENCOUNTER — Encounter: Payer: Self-pay | Admitting: Physical Therapy

## 2015-01-30 DIAGNOSIS — M545 Low back pain, unspecified: Secondary | ICD-10-CM

## 2015-01-30 NOTE — Therapy (Signed)
[facility] [place], [place], [address] Phone: [phone]   Fax:  [phone]  Physical Therapy Treatment  Patient Details  Name: John Ware MRN: [id number] Date of Birth: Jan 11, 1966 Referring Provider:  Terald Sleeper, PA-C  Encounter Date: 01/30/2015      PT End of Session - 01/30/15 0852    Visit Number 5   Number of Visits 12   Date for PT Re-Evaluation 02/27/15   PT Start Time 0815   PT Stop Time 2094   PT Time Calculation (min) 40 min      Past Medical History  Diagnosis Date  . Diabetes mellitus without complication   . Spinal stenosis   . Hypertension   . Depression     Past Surgical History  Procedure Laterality Date  . 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 Vasectomy    . Lumbar laminectomy/decompression microdiscectomy N/A 12/13/2014    Procedure: LUMBAR THREE-FOUR, LUMBAR FOUR-FIVE, LUMBAR FIVE-SACRAL ONE LAMINECTOMY;  Surgeon: Kristeen Miss, MD;  Location: [place] [facility];  Service: Neurosurgery;  Laterality: N/A;  L3-4 L4-5 L5-S1 Laminectomy    There were no vitals filed for this visit.  Visit Diagnosis:  Bilateral low back pain without sciatica      Subjective Assessment - 01/30/15 0833    Subjective no pain symptoms upon arrival , only soreness is lower back with result of fall when he is going up down stairs   Limitations House hold activities   Patient Stated Goals "Recover from surgery."   Currently in Pain? No/denies                         [facility] Adult PT Treatment/Exercise - 01/30/15 0001    Lumbar Exercises: Aerobic   Stationary Bike Nustep L4 x 30mn with posture/core activation   Lumbar Exercises: Standing   Other Standing Lumbar Exercises Pink XTS for scap retractins and ext 2x10 each   Other Standing Lumbar Exercises 4# step out and D1/D2 with core activated 2x10 each   Lumbar Exercises: Supine   Bridge 3 seconds;20 reps   Straight Leg  Raise 20 reps;3 seconds   Other Supine Lumbar Exercises hip bridge with straight leg alt 2x10   Other Supine Lumbar Exercises bil LE lowering 2x10                PT Education - 01/30/15 0839    Education provided Yes   Education Details advanced HEP   Person(s) Educated Patient   Methods Explanation;Demonstration;Handout   Comprehension Verbalized understanding;Returned demonstration             PT Long Term Goals - 01/30/15 0853    PT LONG TERM GOAL #1   Title Ind with HEP.   Time 6   Period Weeks   Status Achieved   PT LONG TERM GOAL #2   Title Perform ADL's with pain not > 2-3/10.   Time 6   Period Weeks   Status Achieved   PT LONG TERM GOAL #3   Title Walk a community distance with pain not > 2-3/10.   Time 6   Period Weeks   Status Achieved               Plan - 01/30/15 07096   Clinical Impression Statement Patient continues to improve with all activities. Had no pain reported today. Patient has met all current goals. Patient independent with  HEP, understands importance of posture and is pain free with all ADL's and activity.   Pt will benefit from skilled therapeutic intervention in order to improve on the following deficits Pain;Decreased activity tolerance   Rehab Potential Excellent   PT Frequency 3x / week   PT Duration 4 weeks   PT Treatment/Interventions ADLs/Self Care Home Management;Electrical Stimulation;Moist Heat;Ultrasound;Therapeutic activities;Therapeutic exercise   PT Next Visit Plan patient is going to MD Elsner and will DC or continue per MD. If continue then MPT will need to update goals.   Consulted and Agree with Plan of Care Patient        Problem List Patient Active Problem List   Diagnosis Date Noted  . Lumbar stenosis 12/13/2014   Ladean Raya, PTA 01/30/2015 8:57 AM  Rachelann Enloe, Venetia Maxon 01/30/2015, 8:57 AM  [facility] [address] [place], [place],  [address] Phone: [phone]   Fax:  [phone]

## 2015-01-30 NOTE — Patient Instructions (Signed)
Bridging: with Straight Leg Raise   With legs bent, lift buttocks ____ inches from floor. Then slowly extend right knee, keeping stomach tight. Repeat _10-30___ times per set. Do _1-2___ sets per session. Do __1-2__ sessions per day.  http://orth.exer.us/1104   Copyright  VHI. All rights reserved.  Double Knee Lift   With knees bent, slowly bring both knees toward chest, keeping stomach tight. Then extend legs without touching feet to floor. Keep trunk rigid. Repeat _10-30___ times per set. Do __1-2__ sets per session. Do _1-2___ sessions per day.  http://orth.exer.us/1106   Copyright  VHI. All rights reserved.  Scapular Retraction: Bilateral   Facing anchor, pull arms back, bringing shoulder blades together. Repeat _30___ times per set. Do __1-2__ sets per session. Do _1-2___ sessions per day.  http://orth.exer.us/176   Copyright  VHI. All rights reserved.   

## 2015-02-02 ENCOUNTER — Encounter: Payer: BLUE CROSS/BLUE SHIELD | Admitting: Physical Therapy

## 2015-02-02 NOTE — Therapy (Signed)
[facility] [place], [place], [address] Phone: [phone]   Fax:  [phone]  Physical Therapy Treatment  Patient Details  Name: John Ware MRN: [id number] Date of Birth: 1966/05/13 Referring Provider:  Terald Sleeper, PA-C  Encounter Date: 01/30/2015    Past Medical History  Diagnosis Date  . Diabetes mellitus without complication   . Spinal stenosis   . Hypertension   . Depression     Past Surgical History  Procedure Laterality Date  . 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 Vasectomy    . Lumbar laminectomy/decompression microdiscectomy N/A 12/13/2014    Procedure: LUMBAR THREE-FOUR, LUMBAR FOUR-FIVE, LUMBAR FIVE-SACRAL ONE LAMINECTOMY;  Surgeon: Kristeen Miss, MD;  Location: [place] [facility];  Service: Neurosurgery;  Laterality: N/A;  L3-4 L4-5 L5-S1 Laminectomy    There were no vitals filed for this visit.  Visit Diagnosis:  Bilateral low back pain without sciatica                                    PT Long Term Goals - 01/30/15 0853    PT LONG TERM GOAL #1   Title Ind with HEP.   Time 6   Period Weeks   Status Achieved   PT LONG TERM GOAL #2   Title Perform ADL's with pain not > 2-3/10.   Time 6   Period Weeks   Status Achieved   PT LONG TERM GOAL #3   Title Walk a community distance with pain not > 2-3/10.   Time 6   Period Weeks   Status Achieved      PHYSICAL THERAPY DISCHARGE SUMMARY  Visits from Start of Care:   Current functional level related to goals / functional outcomes: Please see above.   Remaining deficits: None reported.   Education / Equipment: HEP. Plan: Patient agrees to discharge.  Patient goals were met. Patient is being discharged due to meeting the stated rehab goals.  ?????               Problem List Patient Active Problem List   Diagnosis Date Noted  . Lumbar stenosis 12/13/2014    Dozier Berkovich, [place]  MPT 02/02/2015, 2:32 PM  [facility] [address] [place], [place], [address] Phone: [phone]   Fax:  [phone]

## 2015-10-05 NOTE — Therapy (Signed)
[facility] [place], [place], [address] Phone: [phone]   Fax:  [phone]  Physical Therapy Treatment  Patient Details  Name: John Ware MRN: [id number] Date of Birth: Sep 09, 1965 No Data Recorded  Encounter Date: 01/30/2015    Past Medical History  Diagnosis Date  . Diabetes mellitus without complication   . Spinal stenosis   . Hypertension   . Depression     Past Surgical History  Procedure Laterality Date  . 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 Vasectomy    . Lumbar laminectomy/decompression microdiscectomy N/A 12/13/2014    Procedure: LUMBAR THREE-FOUR, LUMBAR FOUR-FIVE, LUMBAR FIVE-SACRAL ONE LAMINECTOMY;  Surgeon: Kristeen Miss, MD;  Location: [place] [facility];  Service: Neurosurgery;  Laterality: N/A;  L3-4 L4-5 L5-S1 Laminectomy    There were no vitals filed for this visit.  Visit Diagnosis:  Bilateral low back pain without sciatica                                    PT Long Term Goals - 01/30/15 0853    PT LONG TERM GOAL #1   Title Ind with HEP.   Time 6   Period Weeks   Status Achieved   PT LONG TERM GOAL #2   Title Perform ADL's with pain not > 2-3/10.   Time 6   Period Weeks   Status Achieved   PT LONG TERM GOAL #3   Title Walk a community distance with pain not > 2-3/10.   Time 6   Period Weeks   Status Achieved               Problem List Patient Active Problem List   Diagnosis Date Noted  . Lumbar stenosis 12/13/2014  PHYSICAL THERAPY DISCHARGE SUMMARY  Visits from Start of Care: 5  Current functional level related to goals / functional outcomes: Please see above.   Remaining deficits: All goals met.   Education / Equipment: HEP.  Plan: Patient agrees to discharge.  Patient goals were met. Patient is being discharged due to meeting the stated rehab goals.  ?????       Briante Loveall, [place] MPT 10/05/2015, 2:10  PM  [facility] [address] [place], [place], [address] Phone: [phone]   Fax:  [phone]  Name: John Ware MRN: [id number] Date of Birth: 1966-08-13

## 2016-01-17 DIAGNOSIS — M4712 Other spondylosis with myelopathy, cervical region: Secondary | ICD-10-CM | POA: Insufficient documentation

## 2016-01-17 DIAGNOSIS — R200 Anesthesia of skin: Secondary | ICD-10-CM | POA: Insufficient documentation

## 2016-01-24 ENCOUNTER — Other Ambulatory Visit: Payer: Self-pay | Admitting: Neurological Surgery

## 2016-01-24 DIAGNOSIS — M4712 Other spondylosis with myelopathy, cervical region: Secondary | ICD-10-CM

## 2016-01-24 DIAGNOSIS — M4722 Other spondylosis with radiculopathy, cervical region: Principal | ICD-10-CM

## 2016-01-31 ENCOUNTER — Ambulatory Visit
Admission: RE | Admit: 2016-01-31 | Discharge: 2016-01-31 | Disposition: A | Discharge status: Home / Self Care | Payer: BLUE CROSS/BLUE SHIELD | Source: Ambulatory Visit | Attending: Neurological Surgery | Admitting: Neurological Surgery

## 2016-01-31 ENCOUNTER — Other Ambulatory Visit: Payer: Self-pay | Admitting: Neurological Surgery

## 2016-01-31 DIAGNOSIS — H0553 Retained (old) foreign body following penetrating wound of bilateral orbits: Secondary | ICD-10-CM

## 2016-01-31 DIAGNOSIS — Z189 Retained foreign body fragments, unspecified material: Secondary | ICD-10-CM

## 2016-01-31 DIAGNOSIS — M4722 Other spondylosis with radiculopathy, cervical region: Principal | ICD-10-CM

## 2016-01-31 DIAGNOSIS — M4712 Other spondylosis with myelopathy, cervical region: Secondary | ICD-10-CM

## 2016-02-08 DIAGNOSIS — G5600 Carpal tunnel syndrome, unspecified upper limb: Secondary | ICD-10-CM | POA: Insufficient documentation

## 2016-02-29 DIAGNOSIS — M5020 Other cervical disc displacement, unspecified cervical region: Secondary | ICD-10-CM | POA: Insufficient documentation

## 2016-04-16 ENCOUNTER — Other Ambulatory Visit: Payer: Self-pay | Admitting: *Deleted

## 2016-04-16 MED ORDER — BUPROPION HCL ER (XL) 150 MG PO TB24
150.0000 mg | ORAL_TABLET | Freq: Two times a day (BID) | ORAL | 0 refills | Status: DC
Start: 2016-04-16 — End: 2017-02-22

## 2016-05-21 ENCOUNTER — Ambulatory Visit (INDEPENDENT_AMBULATORY_CARE_PROVIDER_SITE_OTHER): Payer: BLUE CROSS/BLUE SHIELD | Admitting: Physician Assistant

## 2016-05-21 ENCOUNTER — Ambulatory Visit (INDEPENDENT_AMBULATORY_CARE_PROVIDER_SITE_OTHER): Payer: BLUE CROSS/BLUE SHIELD

## 2016-05-21 ENCOUNTER — Encounter: Payer: Self-pay | Admitting: Physician Assistant

## 2016-05-21 VITALS — BP 115/80 | HR 72 | Temp 97.4°F | Ht 70.0 in | Wt 227.4 lb

## 2016-05-21 DIAGNOSIS — M4806 Spinal stenosis, lumbar region: Secondary | ICD-10-CM | POA: Diagnosis not present

## 2016-05-21 DIAGNOSIS — M51379 Other intervertebral disc degeneration, lumbosacral region without mention of lumbar back pain or lower extremity pain: Secondary | ICD-10-CM | POA: Insufficient documentation

## 2016-05-21 DIAGNOSIS — W01198A Fall on same level from slipping, tripping and stumbling with subsequent striking against other object, initial encounter: Secondary | ICD-10-CM

## 2016-05-21 DIAGNOSIS — M25561 Pain in right knee: Secondary | ICD-10-CM

## 2016-05-21 DIAGNOSIS — E663 Overweight: Secondary | ICD-10-CM | POA: Insufficient documentation

## 2016-05-21 DIAGNOSIS — M48061 Spinal stenosis, lumbar region without neurogenic claudication: Secondary | ICD-10-CM

## 2016-05-21 DIAGNOSIS — M5136 Other intervertebral disc degeneration, lumbar region: Secondary | ICD-10-CM

## 2016-05-21 DIAGNOSIS — M5137 Other intervertebral disc degeneration, lumbosacral region: Secondary | ICD-10-CM | POA: Insufficient documentation

## 2016-05-21 DIAGNOSIS — Z6832 Body mass index (BMI) 32.0-32.9, adult: Secondary | ICD-10-CM | POA: Diagnosis not present

## 2016-05-21 DIAGNOSIS — W19XXXA Unspecified fall, initial encounter: Secondary | ICD-10-CM | POA: Insufficient documentation

## 2016-05-21 MED ORDER — NAPROXEN 500 MG PO TABS: 500.0000 mg | ORAL_TABLET | Freq: Two times a day (BID) | ORAL | 1 refills | Status: DC

## 2016-05-21 MED ORDER — OXYCODONE-ACETAMINOPHEN 10-325 MG PO TABS: 1.0000 | ORAL_TABLET | Freq: Four times a day (QID) | ORAL | 0 refills | Status: AC | PRN

## 2016-05-21 MED ORDER — OXYCODONE-ACETAMINOPHEN 10-325 MG PO TABS: 1.0000 | ORAL_TABLET | Freq: Four times a day (QID) | ORAL | 0 refills | Status: DC | PRN

## 2016-05-21 NOTE — Patient Instructions (Signed)
  Meniscus Injury, Arthroscopy Arthroscopy is a surgical procedure that involves the use of a small scope that has a camera and surgical instruments on the end (arthroscope). An arthroscope can be used to repair your meniscus injury.  LET YOUR HEALTH CARE PROVIDER KNOW ABOUT:  Any allergies you have.  All medicines you are taking, including vitamins, herbs, eyedrops, creams, and over-the-counter medicines.  Any recent colds or infections you have had or currently have.  Previous problems you or members of your family have had with the use of anesthetics.  Any blood disorders or blood clotting problems you have.  Previous surgeries you have had.  Medical conditions you have. RISKS AND COMPLICATIONS Generally, this is a safe procedure. However, as with any procedure, problems can occur. Possible problems include:  Damage to nerves or blood vessels.  Excess bleeding.  Blood clots.  Infection. BEFORE THE PROCEDURE  Do not eat or drink for 6-8 hours before the procedure.  Take medicines as directed by your surgeon. Ask your surgeon about changing or stopping your regular medicines.  You may have lab tests the morning of surgery. PROCEDURE  You will be given one of the following:   A medicine that numbs the area (local anesthesia).  A medicine that makes you go to sleep (general anesthesia).  A medicine injected into your spine that numbs your body below the waist (spinal anesthesia). Most often, several small cuts (incisions) are made in the knee. The arthroscope and instruments go into the incisions to repair the damage. The torn portion of the meniscus is removed.  During this time, your surgeon may find a partial or complete tear in a cruciate ligament, such as the anterior cruciate ligament (ACL). A completely torn cruciate ligament is reconstructed by taking tissue from another part of the body (grafting) and placing it into the injured area. This requires several larger  incisions to complete the repair. Sometimes, open surgery is needed for collateral ligament injuries. If a collateral ligament is found to be injured, your surgeon may staple or suture the tear through a slightly larger incision on the side of the knee. AFTER THE PROCEDURE You will be taken to the recovery area where your progress will be monitored. When you are awake, stable, and taking fluids without complications, you will be allowed to go home. This is usually the same day. However, more extensive repairs of a ligament may require an overnight stay.  The recovery time after repairing your meniscus or ligament depends on the amount of damage to these structures. It also depends on whether or not reconstructive knee surgery was needed.   A torn or stretched ligament (ligament sprain) may take 6-8 weeks to heal. It takes about the same amount of time if your surgeon removed a torn meniscus.  A repaired meniscus may require 6-12 weeks of recovery time.  A torn ligament needing reconstructive surgery may take 6-12 months to heal fully.   This information is not intended to replace advice given to you by your health care provider. Make sure you discuss any questions you have with your health care provider.   Document Released: 08/09/2000 Document Revised: 08/17/2013 Document Reviewed: 01/08/2013 Elsevier Interactive Patient Education 2016 Elsevier Inc.  

## 2016-05-21 NOTE — Progress Notes (Addendum)
BP 115/80   Pulse 72   Temp 97.4 F (36.3 C) (Oral)   Ht 5\' 10"  (1.778 m)   Wt 227 lb 6.4 oz (103.1 kg)   BMI 32.63 kg/m    Subjective:    Patient ID: John Ware, male    DOB: 07/07/1966, 50 y.o.   MRN: [id number]  John Ware is a 50 y.o. male presenting on 05/21/2016 for Knee Pain (Right knee pain. Patient tripped and right knee went sideways )  HPI Patient here to be [facility]  This patient [facility]. Early in the month he fell from a ladder, twisting the right knee and landing on the side of the of the leg.  Pain is concentrated around the medial joint space and medical collateral ligament. He has daily pain, very uncomfortable at night. Despite resting and ice the knee is still quite painful. Next  Patient will also be seen in a couple months for continuation in his pain treatment. He is following up from the pain center. He is taking oxycodone 10/325 mg, one 4 times a day if needed for severe pain. All of his other conditions and medications are reviewed today.  Relevant past medical, surgical, family and social history reviewed and updated as indicated. Interim medical history since our last visit reviewed. Allergies and medications reviewed and updated.   Data reviewed from any sources in EPIC.  Review of Systems  Constitutional: Negative.  Negative for appetite change and fatigue.  HENT: Negative.   Eyes: Negative.  Negative for pain and visual disturbance.  Respiratory: Negative.  Negative for cough, chest tightness, shortness of breath and wheezing.   Cardiovascular: Negative.  Negative for chest pain, palpitations and leg swelling.  Gastrointestinal: Negative.  Negative for abdominal pain, diarrhea, nausea and vomiting.  Endocrine: Negative.   Genitourinary: Negative.   Musculoskeletal: Positive for arthralgias, back pain, gait problem and myalgias.  Skin: Negative.   Negative for color change and rash.  Neurological: Negative for weakness, numbness and headaches.  Psychiatric/Behavioral: Negative.     Per HPI unless specifically indicated above  Social History   Social History  . Marital status: Married    Spouse name: N/A  . Number of children: N/A  . Years of education: N/A   Occupational History  . Not on file.   Social History Main Topics  . Smoking status: Former Games developermoker  . Smokeless tobacco: Never Used  . Alcohol use Yes  . Drug use: No  . Sexual activity: Not on file   Other Topics Concern  . Not on file   Social History Narrative  . No narrative on file    Past Surgical History:  Procedure Laterality Date  . LUMBAR LAMINECTOMY/DECOMPRESSION MICRODISCECTOMY N/A 12/13/2014   Procedure: LUMBAR THREE-FOUR, LUMBAR FOUR-FIVE, LUMBAR FIVE-SACRAL ONE LAMINECTOMY;  Surgeon: Barnett AbuHenry Elsner, MD;  Location: [facility];  Service: Neurosurgery;  Laterality: N/A;  L3-4 L4-5 L5-S1 Laminectomy  . 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 VASECTOMY      History reviewed. No pertinent family history.    Medication List       Accurate as of 05/21/16  1:40 PM. Always use your most recent med list.          amLODipine 5 MG tablet Commonly known as:  NORVASC Take 5 mg by mouth daily.   baclofen 10 MG tablet Commonly known  as:  LIORESAL Take 10 mg by mouth 2 (two) times daily as needed.   buPROPion 150 MG 24 hr tablet Commonly known as:  WELLBUTRIN XL Take 1 tablet (150 mg total) by mouth 2 (two) times daily.   citalopram 20 MG tablet Commonly known as:  CELEXA Take 20 mg by mouth daily.   gabapentin 300 MG capsule Commonly known as:  NEURONTIN Take 300 mg by mouth 4 (four) times daily.   metFORMIN 500 MG tablet Commonly known as:  GLUCOPHAGE Take 500 mg by mouth 3 (three) times daily.   naproxen 500 MG tablet Commonly known as:  NAPROSYN Take 1 tablet (500 mg total) by mouth 2 (two) times daily with a  meal.   oxyCODONE-acetaminophen 10-325 MG tablet Commonly known as:  PERCOCET Take 1 tablet by mouth every 6 (six) hours as needed.   oxyCODONE-acetaminophen 10-325 MG tablet Commonly known as:  PERCOCET Take 1 tablet by mouth every 6 (six) hours as needed for pain.   pioglitazone 30 MG tablet Commonly known as:  ACTOS Take 30 mg by mouth daily.   ramipril 10 MG capsule Commonly known as:  ALTACE Take 10 mg by mouth daily.          Objective:    BP 115/80   Pulse 72   Temp 97.4 F (36.3 C) (Oral)   Ht 5\' 10"  (1.778 m)   Wt 227 lb 6.4 oz (103.1 kg)   BMI 32.63 kg/m   No Known Allergies Wt Readings from Last 3 Encounters:  05/21/16 227 lb 6.4 oz (103.1 kg)  01/31/16 235 lb (106.6 kg)  12/13/14 233 lb (105.7 kg)    Physical Exam  Constitutional: He appears well-developed and well-nourished. No distress.  HENT:  Head: Normocephalic and atraumatic.  Eyes: Conjunctivae and EOM are normal. Pupils are equal, round, and reactive to light.  Cardiovascular: Normal rate, regular rhythm and normal heart sounds.   Pulmonary/Chest: Effort normal and breath sounds normal. No respiratory distress.  Musculoskeletal: Normal range of motion. He exhibits tenderness. He exhibits no edema.       Right knee: He exhibits no LCL laxity, normal patellar mobility and no MCL laxity. Tenderness found. Medial joint line and MCL tenderness noted.  Skin: Skin is warm and dry.  Psychiatric: He has a normal mood and affect. His behavior is normal.  Nursing note and vitals reviewed.       Assessment & Plan:   1. Fall, initial encounter - Ambulatory referral to Orthopedic Surgery  2. Right knee pain - DG Knee 1-2 Views Right; Future Preliminarly read by Remus Loffler, PA-C - naproxen (NAPROSYN) 500 MG tablet; Take 1 tablet (500 mg total) by mouth 2 (two) times daily with a meal.  Dispense: 60 tablet; Refill: 1 - Ambulatory referral to Orthopedic Surgery  3. DDD (degenerative disc  disease), lumbar - oxyCODONE-acetaminophen (PERCOCET) 10-325 MG tablet; Take 1 tablet by mouth every 6 (six) hours as needed.  Dispense: 120 tablet; Refill: 0 - oxyCODONE-acetaminophen (PERCOCET) 10-325 MG tablet; Take 1 tablet by mouth every 6 (six) hours as needed for pain.  Dispense: 120 tablet; Refill: 0  4. Lumbar stenosis - oxyCODONE-acetaminophen (PERCOCET) 10-325 MG tablet; Take 1 tablet by mouth every 6 (six) hours as needed.  Dispense: 120 tablet; Refill: 0 - oxyCODONE-acetaminophen (PERCOCET) 10-325 MG tablet; Take 1 tablet by mouth every 6 (six) hours as needed for pain.  Dispense: 120 tablet; Refill: 0  5. Body mass index 32.0-32.9, adult   Continue  all other maintenance medications as listed above. Educational handout given for meniscal tear  Follow up plan: Return in about 2 months (around 07/21/2016) for pain appointment.  Remus Loffler PA-C [facility] [address]  [place], [place] [address] [phone]   05/21/2016, 1:40 PM

## 2016-05-27 ENCOUNTER — Other Ambulatory Visit (HOSPITAL_COMMUNITY): Payer: Self-pay | Admitting: Orthopedic Surgery

## 2016-05-27 DIAGNOSIS — M25561 Pain in right knee: Secondary | ICD-10-CM

## 2016-05-30 ENCOUNTER — Ambulatory Visit (HOSPITAL_COMMUNITY)
Admission: RE | Admit: 2016-05-30 | Discharge: 2016-05-30 | Disposition: A | Discharge status: Home / Self Care | Payer: BLUE CROSS/BLUE SHIELD | Source: Ambulatory Visit | Attending: Orthopedic Surgery | Admitting: Orthopedic Surgery

## 2016-05-30 DIAGNOSIS — X58XXXA Exposure to other specified factors, initial encounter: Secondary | ICD-10-CM | POA: Insufficient documentation

## 2016-05-30 DIAGNOSIS — S83241A Other tear of medial meniscus, current injury, right knee, initial encounter: Secondary | ICD-10-CM | POA: Diagnosis not present

## 2016-05-30 DIAGNOSIS — M25461 Effusion, right knee: Secondary | ICD-10-CM | POA: Insufficient documentation

## 2016-05-30 DIAGNOSIS — M25561 Pain in right knee: Secondary | ICD-10-CM | POA: Diagnosis not present

## 2016-05-30 DIAGNOSIS — M659 Synovitis and tenosynovitis, unspecified: Secondary | ICD-10-CM | POA: Insufficient documentation

## 2016-05-30 DIAGNOSIS — M7121 Synovial cyst of popliteal space [Baker], right knee: Secondary | ICD-10-CM | POA: Diagnosis not present

## 2016-05-30 DIAGNOSIS — M2241 Chondromalacia patellae, right knee: Secondary | ICD-10-CM | POA: Insufficient documentation

## 2016-05-30 DIAGNOSIS — M23300 Other meniscus derangements, unspecified lateral meniscus, right knee: Secondary | ICD-10-CM | POA: Diagnosis not present

## 2016-05-30 DIAGNOSIS — M84361A Stress fracture, right tibia, initial encounter for fracture: Secondary | ICD-10-CM | POA: Diagnosis not present

## 2016-05-30 DIAGNOSIS — M67461 Ganglion, right knee: Secondary | ICD-10-CM | POA: Diagnosis not present

## 2016-06-06 ENCOUNTER — Other Ambulatory Visit: Payer: Self-pay | Admitting: Physician Assistant

## 2016-06-06 NOTE — Telephone Encounter (Signed)
May refill 

## 2016-06-06 NOTE — Telephone Encounter (Signed)
Please advise on gabapentin script.

## 2016-06-07 MED ORDER — GABAPENTIN 300 MG PO CAPS: 300.0000 mg | ORAL_CAPSULE | Freq: Four times a day (QID) | ORAL | 6 refills | Status: DC

## 2016-06-11 ENCOUNTER — Ambulatory Visit: Payer: BLUE CROSS/BLUE SHIELD | Attending: Orthopedic Surgery | Admitting: Physical Therapy

## 2016-06-11 DIAGNOSIS — M6281 Muscle weakness (generalized): Secondary | ICD-10-CM

## 2016-06-11 DIAGNOSIS — M25561 Pain in right knee: Secondary | ICD-10-CM

## 2016-06-11 NOTE — Telephone Encounter (Signed)
Aware, script sent in. 

## 2016-06-11 NOTE — Patient Instructions (Signed)
  Quad Set   With other leg bent, foot flat, slowly tighten muscles on thigh of straight leg while counting out loud to _10___. Repeat with other leg. Repeat _10-20__ times. Do _4-5___ sessions per day.  Bracing With Heel Slides (Supine)   With neutral spine, tighten pelvic floor and abdominals and hold. Alternating legs, slide heel to bottom. Repeat 10___ times. Do __4-5_ times a day.   Hip Flexion / Knee Extension: Straight-Leg Raise (Eccentric)   Lie on back. Lift leg with knee straight. Slowly lower leg for 3-5 seconds. _10__ reps per set, _1-3__  4-5 x.dat Lower like elevator, stopping at each floor.   THEN:   turn foot outward and repeat SLR 3x10 reps.   Knee Extension: Quadriceps Double Leg Minisquat (Eccentric)    Standing, slowly bend knees 15-45 degrees and hold for 3-5 seconds. Be sure you are weightbearing equally on both legs. _10__ reps per set, _1-3__ sets per day, __5_ days per week.  http://ecce.exer.us/128   Copyright  VHI. All rights reserved.    Hamstring Stretch, Reclined (Strap, Doorframe)   Lengthen bottom leg on floor. Extend top leg along edge of doorframe or press foot up into yoga strap. Hold for 30 seconds. Repeat 3_ times each leg. 2 x per day.   John Ware, PT 06/11/16 8:50 AM [facility] [address] [place], [place], [address] Phone: [phone]   Fax:  [phone]

## 2016-06-11 NOTE — Therapy (Signed)
[facility] [place], [place], [address] Phone: [phone]   Fax:  [phone]  Physical Therapy Evaluation  Patient Details  Name: John Ware MRN: [id number] Date of Birth: [birth year] Referring Provider: Flossie Dibble  Encounter Date: 06/11/2016      PT End of Session - 06/11/16 0823    Visit Number 1   Number of Visits 1   Date for PT Re-Evaluation 06/11/16   PT Start Time 0824   PT Stop Time 0857   PT Time Calculation (min) 33 min   Activity Tolerance Patient tolerated treatment well   Behavior During Therapy [facility] for tasks assessed/performed      Past Medical History:  Diagnosis Date  . Depression   . Diabetes mellitus without complication ([place])   . Hypertension   . Spinal stenosis     Past Surgical History:  Procedure Laterality Date  . LUMBAR LAMINECTOMY/DECOMPRESSION MICRODISCECTOMY N/A 12/13/2014   Procedure: LUMBAR THREE-FOUR, LUMBAR FOUR-FIVE, LUMBAR FIVE-SACRAL ONE LAMINECTOMY;  Surgeon: Kristeen Miss, MD;  Location: [facility];  Service: Neurosurgery;  Laterality: N/A;  L3-4 L4-5 L5-S1 Laminectomy  . 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 VASECTOMY      There were no vitals filed for this visit.       Subjective Assessment - 06/11/16 0825    Subjective Patient had R med menisectomy 06/05/16. He reports intermittent stabbing pain, but "its not that  bad" and only lasts a minute. 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   Pertinent History DM, HTN, lumbar laminectomy, B CTS, spinal stenosis   Patient Stated Goals to get back to work   Currently in Pain? No/denies  5/10 with stabbing pain intermittent            [facility] PT Assessment - 06/11/16 0001      Assessment   Medical Diagnosis s/p R knee scope med meniscectomy   Referring Provider Flossie Dibble   Onset Date/Surgical Date 06/05/16   Next MD Visit 06/17/16     Precautions   Precautions Knee   Precaution Comments med menisectomy protocol     Restrictions   Weight Bearing Restrictions No     Balance Screen   Has the patient fallen in the past 6 months Yes  fell over ladder at work   How many times? 1   Has the patient had a decrease in activity level because of a fear of falling?  No   Is the patient reluctant to leave their home because of a fear of falling?  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   Time 1   Period Days   Status Achieved               Plan - 06/11/16 0855    Clinical Impression Statement Patient presents s/p R med menisectomy. He has full ROM and only mild strength deficits and minimal pain and swelling. Patient should do fine with a HEP which was issued.    Rehab Potential Excellent   PT Frequency One time visit   PT Treatment/Interventions Therapeutic exercise   PT Next Visit Plan Patient to see MD 06/13/16. D/C if MD agrees.   PT Home Exercise Plan quad sets, SLR, SLR with ER, heel slides, minisquats and HS stretch.   Consulted and Agree with Plan of Care Patient      Patient will benefit from skilled therapeutic intervention in order to improve the following deficits and impairments:  Decreased  strength, Pain  Visit Diagnosis: Muscle weakness (generalized) - Plan: PT plan of care cert/re-cert  Acute pain of right knee - Plan: PT plan of care cert/re-cert     Problem List Patient Active Problem List   Diagnosis Date Noted  . Fall 05/21/2016  . Right knee pain 05/21/2016  . DDD (degenerative disc disease), lumbar 05/21/2016  . Body mass index 32.0-32.9, adult 05/21/2016  . Lumbar stenosis 12/13/2014    Madelyn Flavors PT 06/11/2016, 9:33 PM  [facility] [place], [place], [address] Phone: [phone]   Fax:  [phone]  Name: John Ware MRN: [id number] Date of Birth: 1965-11-07   PHYSICAL THERAPY DISCHARGE SUMMARY  Visits from Start of Care: 1  Current functional level related to goals / functional outcomes: See Above   Remaining deficits: See above   Education / Equipment: HEP Plan: Patient agrees to discharge.  Patient goals were met. Patient is being discharged due to meeting the stated rehab goals.  ?????         Madelyn Flavors, PT 06/11/16 9:34 PM [facility] [place], [place], [address] Phone: [phone]   Fax:  [phone]

## 2016-07-09 ENCOUNTER — Ambulatory Visit (INDEPENDENT_AMBULATORY_CARE_PROVIDER_SITE_OTHER): Payer: BLUE CROSS/BLUE SHIELD

## 2016-07-09 DIAGNOSIS — Z23 Encounter for immunization: Secondary | ICD-10-CM | POA: Diagnosis not present

## 2016-07-23 ENCOUNTER — Ambulatory Visit (INDEPENDENT_AMBULATORY_CARE_PROVIDER_SITE_OTHER): Payer: BLUE CROSS/BLUE SHIELD | Admitting: Physician Assistant

## 2016-07-23 ENCOUNTER — Encounter: Payer: Self-pay | Admitting: Physician Assistant

## 2016-07-23 VITALS — BP 141/95 | HR 74 | Temp 98.6°F | Ht 70.0 in | Wt 230.0 lb

## 2016-07-23 DIAGNOSIS — Z0283 Encounter for blood-alcohol and blood-drug test: Secondary | ICD-10-CM

## 2016-07-23 DIAGNOSIS — M48061 Spinal stenosis, lumbar region without neurogenic claudication: Secondary | ICD-10-CM | POA: Diagnosis not present

## 2016-07-23 DIAGNOSIS — M5136 Other intervertebral disc degeneration, lumbar region: Secondary | ICD-10-CM

## 2016-07-23 MED ORDER — OXYCODONE-ACETAMINOPHEN 10-325 MG PO TABS: 1.0000 | ORAL_TABLET | Freq: Four times a day (QID) | ORAL | 0 refills | Status: DC | PRN

## 2016-07-23 MED ORDER — BACLOFEN 10 MG PO TABS: 10.0000 mg | ORAL_TABLET | Freq: Two times a day (BID) | ORAL | 5 refills | Status: DC

## 2016-07-23 NOTE — Addendum Note (Signed)
Addended by: Tamera PuntWRAY, Elwyn Lowden S on: 07/23/2016 09:48 AM   Modules accepted: Orders

## 2016-07-23 NOTE — Progress Notes (Signed)
BP (!) 141/95   Pulse 74   Temp 98.6 F (37 C) (Oral)   Ht 5\' 10"  (1.778 m)   Wt 230 lb (104.3 kg)   BMI 33.00 kg/m    Subjective:    Patient ID: John Ware, male    DOB: 11-09-1965, 50 y.o.   MRN: [id number]  HPI: John Ware is a 50 y.o. male presenting on 07/23/2016 for Follow-up (Pain management ) Initial pain information included in note.  [place] Controlled Substance Abuse Database reviewed- Yes If yes- were their any concerning findings : no  Depression screen [facility] 2/9 07/23/2016 05/21/2016  Decreased Interest - 0  Down, Depressed, Hopeless 0 0  PHQ - 2 Score 0 0    No flowsheet data found.   [place] drug screen performed- Yes  SOAPP 0= never  1= seldom  2=sometimes  3= often  4= very often  1. How often do you have mood swings? 0 2. How often do you smoke a cigarette within an hour after waling up? 0 3. How often have you taken medication other than the way that it was prescribed?0 4. How often have you used illegal drugs in the past 5 years? 0 5. How often, in your lifetime, have you had legal problems or been arrested? 0  Score 0   Alcohol Audit - How often during the last year have found that you: 0-Never   1- Less than monthly   2- Monthly     3-Weekly     4-daily or almost daily  1. found that you were not able to stop drinking once you started- 0 2. failed to do what was normally expected of you because of drinking- 0 3. needed a first drink in the morning- 0 4. had a feeling of guilt or remorse after drinking- 0 5. are/were unable to remember what happened the night before because of your drinking- 0  0- NO   2- yes but not in last year  4- yes during last year 6. Have you or someone else been injured because of your drinking- 0 7. Has anyone been concerned about your drinking or suggested you cut down- 0        TOTAL- 0   ( 0-7- alcohol education, 8-15- simple advice, 16-19 simple advice plus counseling, 20-40 referral for  evaluation and treatment 0   [facility]  Pain assessment: Cause of pain- DDD lumbar spine, spinal stenosis, cervical spine, herniated cervical disc Pain location- cervical, lumbar, neuropathy legs Pain on scale of 1-10- 8 Frequency- daily What increases pain-standing, lifting What makes pain Better-sitting, medications Effects on ADL - mild-mod  Prior treatments tried and failed- surgeries, [name] Current treatments- medications, exercise Morphine mg equivalent-60 mg  Pain management agreement reviewed and signed- Yes    Past Medical History:  Diagnosis Date  . Depression   . Diabetes mellitus without complication (HCC)   . Hypertension   . Spinal stenosis    Relevant past medical, surgical, family and social history reviewed and updated as indicated. Interim medical history since our last visit reviewed. Allergies and medications reviewed and updated. DATA REVIEWED: CHART IN EPIC  Social History   Social History  . Marital status: Married    Spouse name: N/A  . Number of children: N/A  . Years of education: N/A   Occupational History  . Not on file.   Social History Main Topics  . Smoking status: Former Games developer  . Smokeless  tobacco: Never Used  . Alcohol use Yes  . Drug use: No  . Sexual activity: Not on file   Other Topics Concern  . Not on file   Social History Narrative  . No narrative on file    Past Surgical History:  Procedure Laterality Date  . LUMBAR LAMINECTOMY/DECOMPRESSION MICRODISCECTOMY N/A 12/13/2014   Procedure: LUMBAR THREE-FOUR, LUMBAR FOUR-FIVE, LUMBAR FIVE-SACRAL ONE LAMINECTOMY;  Surgeon: Barnett AbuHenry Elsner, MD;  Location: [facility];  Service: Neurosurgery;  Laterality: N/A;  L3-4 L4-5 L5-S1 Laminectomy  . 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 VASECTOMY      History reviewed. No pertinent family history.  Review of Systems  Constitutional: Negative.  Negative for appetite change and  fatigue.  HENT: Negative.   Eyes: Negative.  Negative for pain and visual disturbance.  Respiratory: Negative.  Negative for cough, chest tightness, shortness of breath and wheezing.   Cardiovascular: Negative.  Negative for chest pain, palpitations and leg swelling.  Gastrointestinal: Negative.  Negative for abdominal pain, diarrhea, nausea and vomiting.  Endocrine: Negative.   Genitourinary: Negative.   Musculoskeletal: Positive for arthralgias, back pain, myalgias, neck pain and neck stiffness.  Skin: Negative.  Negative for color change and rash.  Neurological: Negative.  Negative for weakness, numbness and headaches.  Psychiatric/Behavioral: Negative.       Medication List       Accurate as of 07/23/16  8:55 AM. Always use your most recent med list.          amLODipine 5 MG tablet Commonly known as:  NORVASC Take 5 mg by mouth daily.   baclofen 10 MG tablet Commonly known as:  LIORESAL Take 1 tablet (10 mg total) by mouth 2 (two) times daily.   buPROPion 150 MG 24 hr tablet Commonly known as:  WELLBUTRIN XL Take 1 tablet (150 mg total) by mouth 2 (two) times daily.   citalopram 20 MG tablet Commonly known as:  CELEXA Take 20 mg by mouth daily.   gabapentin 300 MG capsule Commonly known as:  NEURONTIN Take 1 capsule (300 mg total) by mouth 4 (four) times daily.   metFORMIN 500 MG tablet Commonly known as:  GLUCOPHAGE Take 500 mg by mouth 3 (three) times daily.   oxyCODONE-acetaminophen 10-325 MG tablet Commonly known as:  PERCOCET Take 1 tablet by mouth every 6 (six) hours as needed for pain.   oxyCODONE-acetaminophen 10-325 MG tablet Commonly known as:  PERCOCET Take 1 tablet by mouth every 6 (six) hours as needed for pain.   oxyCODONE-acetaminophen 10-325 MG tablet Commonly known as:  PERCOCET Take 1 tablet by mouth every 6 (six) hours as needed for pain.   pioglitazone 30 MG tablet Commonly known as:  ACTOS Take 30 mg by mouth daily.   ramipril 10  MG capsule Commonly known as:  ALTACE Take 10 mg by mouth daily.          Objective:    BP (!) 141/95   Pulse 74   Temp 98.6 F (37 C) (Oral)   Ht 5\' 10"  (1.778 m)   Wt 230 lb (104.3 kg)   BMI 33.00 kg/m   No Known Allergies  Wt Readings from Last 3 Encounters:  07/23/16 230 lb (104.3 kg)  05/21/16 227 lb 6.4 oz (103.1 kg)  01/31/16 235 lb (106.6 kg)    Physical Exam  Constitutional: He appears well-developed and well-nourished. No distress.  HENT:  Head: Normocephalic and atraumatic.  Eyes: Conjunctivae  and EOM are normal. Pupils are equal, round, and reactive to light.  Cardiovascular: Normal rate, regular rhythm and normal heart sounds.   Pulmonary/Chest: Effort normal and breath sounds normal. No respiratory distress.  Musculoskeletal:       Lumbar back: He exhibits decreased range of motion, tenderness, pain and spasm.  Neurological:  Reflex Scores:      Patellar reflexes are 1+ on the right side and 3+ on the left side. Skin: Skin is warm and dry.  Psychiatric: He has a normal mood and affect. His behavior is normal.  Nursing note and vitals reviewed.       Assessment & Plan:   1. DDD (degenerative disc disease), lumbar - oxyCODONE-acetaminophen (PERCOCET) 10-325 MG tablet; Take 1 tablet by mouth every 6 (six) hours as needed for pain.  Dispense: 120 tablet; Refill: 0 - baclofen (LIORESAL) 10 MG tablet; Take 1 tablet (10 mg total) by mouth 2 (two) times daily.  Dispense: 60 each; Refill: 5 - oxyCODONE-acetaminophen (PERCOCET) 10-325 MG tablet; Take 1 tablet by mouth every 6 (six) hours as needed for pain.  Dispense: 120 tablet; Refill: 0 - oxyCODONE-acetaminophen (PERCOCET) 10-325 MG tablet; Take 1 tablet by mouth every 6 (six) hours as needed for pain.  Dispense: 120 tablet; Refill: 0  2. Spinal stenosis of lumbar region, unspecified whether neurogenic claudication present - oxyCODONE-acetaminophen (PERCOCET) 10-325 MG tablet; Take 1 tablet by mouth every 6  (six) hours as needed for pain.  Dispense: 120 tablet; Refill: 0 - baclofen (LIORESAL) 10 MG tablet; Take 1 tablet (10 mg total) by mouth 2 (two) times daily.  Dispense: 60 each; Refill: 5 - oxyCODONE-acetaminophen (PERCOCET) 10-325 MG tablet; Take 1 tablet by mouth every 6 (six) hours as needed for pain.  Dispense: 120 tablet; Refill: 0 - oxyCODONE-acetaminophen (PERCOCET) 10-325 MG tablet; Take 1 tablet by mouth every 6 (six) hours as needed for pain.  Dispense: 120 tablet; Refill: 0   Continue all other maintenance medications as listed above.  Follow up plan: Return in about 3 months (around 10/23/2016) for recheck pain meds.  Educational handout given for spinal stenosis  Remus LofflerAngel S. Joline Encalada PA-C [facility] [address]  [place], [place] [id number] [phone]   07/23/2016, 8:55 AM

## 2016-07-23 NOTE — Patient Instructions (Signed)
Spinal Stenosis Spinal stenosis is an abnormal narrowing of the canals of your spine (vertebrae). CAUSES  Spinal stenosis is caused by areas of bone pushing into the central canals of your vertebrae. This condition can be present at birth (congenital). It also may be caused by arthritic deterioration of your vertebrae (spinal degeneration).  SYMPTOMS   Pain that is generally worse with activities, particularly standing and walking.  Numbness, tingling, hot or cold sensations, weakness, or weariness in your legs.  Frequent episodes of falling.  A foot-slapping gait that leads to muscle weakness. DIAGNOSIS  Spinal stenosis is diagnosed with the use of magnetic resonance imaging (MRI) or computed tomography (CT). TREATMENT  Initial therapy for spinal stenosis focuses on the management of the pain and other symptoms associated with the condition. These therapies include:  Practicing postural changes to lessen pressure on your nerves.  Exercises to strengthen the core of your body.  Loss of excess body weight.  The use of nonsteroidal anti-inflammatory medicines to reduce swelling and inflammation in your nerves. When therapies to manage pain are not successful, surgery to treat spinal stenosis may be recommended. This surgery involves removing excess bone, which puts pressure on your nerve roots. During this surgery (laminectomy), the posterior boney arch (lamina) and excess bone around the facet joints are removed. This information is not intended to replace advice given to you by your health care provider. Make sure you discuss any questions you have with your health care provider. Document Released: 11/02/2003 Document Revised: 09/02/2014 Document Reviewed: 11/20/2012 Elsevier Interactive Patient Education  2017 Elsevier Inc.  

## 2016-07-26 LAB — TOXASSURE SELECT 13 (MW), URINE

## 2016-10-24 ENCOUNTER — Ambulatory Visit (INDEPENDENT_AMBULATORY_CARE_PROVIDER_SITE_OTHER): Payer: BLUE CROSS/BLUE SHIELD | Admitting: Physician Assistant

## 2016-10-24 ENCOUNTER — Encounter: Payer: Self-pay | Admitting: Physician Assistant

## 2016-10-24 VITALS — BP 133/83 | HR 82 | Temp 97.5°F | Ht 70.0 in | Wt 229.0 lb

## 2016-10-24 DIAGNOSIS — M5136 Other intervertebral disc degeneration, lumbar region: Secondary | ICD-10-CM

## 2016-10-24 DIAGNOSIS — E785 Hyperlipidemia, unspecified: Secondary | ICD-10-CM

## 2016-10-24 DIAGNOSIS — M48061 Spinal stenosis, lumbar region without neurogenic claudication: Secondary | ICD-10-CM

## 2016-10-24 DIAGNOSIS — E1029 Type 1 diabetes mellitus with other diabetic kidney complication: Secondary | ICD-10-CM | POA: Diagnosis not present

## 2016-10-24 DIAGNOSIS — E1169 Type 2 diabetes mellitus with other specified complication: Secondary | ICD-10-CM | POA: Diagnosis not present

## 2016-10-24 DIAGNOSIS — R809 Proteinuria, unspecified: Secondary | ICD-10-CM

## 2016-10-24 DIAGNOSIS — F339 Major depressive disorder, recurrent, unspecified: Secondary | ICD-10-CM | POA: Diagnosis not present

## 2016-10-24 LAB — BAYER DCA HB A1C WAIVED: HB A1C (BAYER DCA - WAIVED): 7.1 % — ABNORMAL HIGH (ref <7.0)

## 2016-10-24 MED ORDER — GABAPENTIN 600 MG PO TABS: 600.0000 mg | ORAL_TABLET | Freq: Three times a day (TID) | ORAL | 6 refills | Status: DC

## 2016-10-24 MED ORDER — OXYCODONE-ACETAMINOPHEN 10-325 MG PO TABS: 1.0000 | ORAL_TABLET | Freq: Four times a day (QID) | ORAL | 0 refills | Status: DC | PRN

## 2016-10-24 MED ORDER — CITALOPRAM HYDROBROMIDE 40 MG PO TABS: 40.0000 mg | ORAL_TABLET | Freq: Every day | ORAL | 2 refills | Status: DC

## 2016-10-24 MED ORDER — BACLOFEN 10 MG PO TABS: 10.0000 mg | ORAL_TABLET | Freq: Two times a day (BID) | ORAL | 5 refills | Status: DC

## 2016-10-24 NOTE — Patient Instructions (Addendum)

## 2016-10-24 NOTE — Progress Notes (Signed)
BP 133/83   Pulse 82   Temp 97.5 F (36.4 C) (Oral)   Ht '5\' 10"'  (1.778 m)   Wt 229 lb (103.9 kg)   BMI 32.86 kg/m    Subjective:    Patient ID: John Ware, male    DOB: Sep 27, 1965, 51 y.o.   MRN: [id number]  HPI: DARIUSH MCNELLIS is a 51 y.o. male presenting on 10/24/2016 for Follow-up; Medication Refill; and Diabetes  This patient comes in for periodic recheck on medications and conditions including Degenerative disc disease with chronic back pain, spinal stenosis, diabetes with hyperlipidemia and microalbuminuria, recurrent depression.  His PHQ 9 is at an 34 today so he knows that he is feeling more depressed. We'll look at his medicines and make adjustments from there and plan to recheck him in a few weeks. He is feeling well in all other areas and has no new complaints..   Depression screen [facility] 2/9 10/24/2016 07/23/2016 05/21/2016  Decreased Interest 2 - 0  Down, Depressed, Hopeless 1 0 0  PHQ - 2 Score 3 0 0  Altered sleeping 1 - -  Tired, decreased energy 3 - -  Change in appetite 1 - -  Feeling bad or failure about yourself  0 - -  Trouble concentrating 3 - -  Moving slowly or fidgety/restless 0 - -  Suicidal thoughts 0 - -  PHQ-9 Score 11 - -     All medications are reviewed today. There are no reports of any problems with the medications. All of the medical conditions are reviewed and updated.  Lab work is reviewed and will be ordered as medically necessary. There are no new problems reported with today's visit.  Relevant past medical, surgical, family and social history reviewed and updated as indicated. Allergies and medications reviewed and updated.  Past Medical History:  Diagnosis Date  . Depression   . Diabetes mellitus without complication ([place])   . Hypertension   . Spinal stenosis     Past Surgical History:  Procedure Laterality Date  . LUMBAR LAMINECTOMY/DECOMPRESSION MICRODISCECTOMY N/A 12/13/2014   Procedure: LUMBAR THREE-FOUR, LUMBAR FOUR-FIVE,  LUMBAR FIVE-SACRAL ONE LAMINECTOMY;  Surgeon: Kristeen Miss, MD;  Location: [place] [facility];  Service: Neurosurgery;  Laterality: N/A;  L3-4 L4-5 L5-S1 Laminectomy  . 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 VASECTOMY      Review of Systems  Constitutional: Negative.  Negative for appetite change and fatigue.  HENT: Negative.   Eyes: Negative.  Negative for pain and visual disturbance.  Respiratory: Negative.  Negative for cough, chest tightness, shortness of breath and wheezing.   Cardiovascular: Negative.  Negative for chest pain, palpitations and leg swelling.  Gastrointestinal: Negative.  Negative for abdominal pain, diarrhea, nausea and vomiting.  Endocrine: Negative.   Genitourinary: Negative.   Musculoskeletal: Negative.   Skin: Negative.  Negative for color change and rash.  Neurological: Negative.  Negative for weakness, numbness and headaches.  Psychiatric/Behavioral: Positive for decreased concentration and dysphoric mood. Negative for sleep disturbance and suicidal ideas.    Allergies as of 10/24/2016   No Known Allergies     Medication List       Accurate as of 10/24/16 11:25 AM. Always use your most recent med list.          amLODipine 5 MG tablet Commonly known as:  NORVASC Take 5 mg by mouth daily.   baclofen 10 MG tablet Commonly known as:  LIORESAL Take 1 tablet (  10 mg total) by mouth 2 (two) times daily.   buPROPion 150 MG 24 hr tablet Commonly known as:  WELLBUTRIN XL Take 1 tablet (150 mg total) by mouth 2 (two) times daily.   citalopram 40 MG tablet Commonly known as:  CELEXA Take 1 tablet (40 mg total) by mouth daily.   gabapentin 600 MG tablet Commonly known as:  NEURONTIN Take 1 tablet (600 mg total) by mouth 3 (three) times daily.   metFORMIN 500 MG tablet Commonly known as:  GLUCOPHAGE Take 500 mg by mouth 3 (three) times daily.   oxyCODONE-acetaminophen 10-325 MG tablet Commonly known as:  PERCOCET Take 1 tablet by mouth  every 6 (six) hours as needed for pain.   oxyCODONE-acetaminophen 10-325 MG tablet Commonly known as:  PERCOCET Take 1 tablet by mouth every 6 (six) hours as needed for pain.   oxyCODONE-acetaminophen 10-325 MG tablet Commonly known as:  PERCOCET Take 1 tablet by mouth every 6 (six) hours as needed for pain.   pioglitazone 30 MG tablet Commonly known as:  ACTOS Take 30 mg by mouth daily.   ramipril 10 MG capsule Commonly known as:  ALTACE Take 10 mg by mouth daily.          Objective:    BP 133/83   Pulse 82   Temp 97.5 F (36.4 C) (Oral)   Ht '5\' 10"'  (1.778 m)   Wt 229 lb (103.9 kg)   BMI 32.86 kg/m   No Known Allergies  Physical Exam  Constitutional: He appears well-developed and well-nourished.  HENT:  Head: Normocephalic and atraumatic.  Eyes: Conjunctivae and EOM are normal. Pupils are equal, round, and reactive to light.  Neck: Normal range of motion. Neck supple.  Cardiovascular: Normal rate, regular rhythm and normal heart sounds.   Pulmonary/Chest: Effort normal and breath sounds normal.  Abdominal: Soft. Bowel sounds are normal.  Musculoskeletal: Normal range of motion.  Skin: Skin is warm and dry.  Nursing note and vitals reviewed.   Results for orders placed or performed in visit on 10/24/16  Bayer DCA Hb A1c Waived  Result Value Ref Range   Bayer DCA Hb A1c Waived 7.1 (H) <7.0 %      Assessment & Plan:   1. DDD (degenerative disc disease), lumbar - oxyCODONE-acetaminophen (PERCOCET) 10-325 MG tablet; Take 1 tablet by mouth every 6 (six) hours as needed for pain.  Dispense: 120 tablet; Refill: 0 - oxyCODONE-acetaminophen (PERCOCET) 10-325 MG tablet; Take 1 tablet by mouth every 6 (six) hours as needed for pain.  Dispense: 120 tablet; Refill: 0 - oxyCODONE-acetaminophen (PERCOCET) 10-325 MG tablet; Take 1 tablet by mouth every 6 (six) hours as needed for pain.  Dispense: 120 tablet; Refill: 0 - baclofen (LIORESAL) 10 MG tablet; Take 1 tablet (10  mg total) by mouth 2 (two) times daily.  Dispense: 60 each; Refill: 5  2. Spinal stenosis of lumbar region, unspecified whether neurogenic claudication present - oxyCODONE-acetaminophen (PERCOCET) 10-325 MG tablet; Take 1 tablet by mouth every 6 (six) hours as needed for pain.  Dispense: 120 tablet; Refill: 0 - oxyCODONE-acetaminophen (PERCOCET) 10-325 MG tablet; Take 1 tablet by mouth every 6 (six) hours as needed for pain.  Dispense: 120 tablet; Refill: 0 - oxyCODONE-acetaminophen (PERCOCET) 10-325 MG tablet; Take 1 tablet by mouth every 6 (six) hours as needed for pain.  Dispense: 120 tablet; Refill: 0 - baclofen (LIORESAL) 10 MG tablet; Take 1 tablet (10 mg total) by mouth 2 (two) times daily.  Dispense: 60 each;  Refill: 5  3. 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 Hyperlipidemia associated with type 2 diabetes mellitus ([place]) - Lipid panel  5. Depression, recurrent ([place]) - citalopram (CELEXA) 40 MG tablet; Take 1 tablet (40 mg total) by mouth daily.  Dispense: 30 tablet; Refill: 2   Continue all other maintenance medications as listed above.  Follow up plan: Return in about 3 months (around 01/24/2017) for recheck meds.  Educational handout given for depression  Terald Sleeper PA-C [place] [address]  [place], [place] [address] [phone]   10/24/2016, 11:25 AM

## 2016-10-25 ENCOUNTER — Ambulatory Visit: Payer: BLUE CROSS/BLUE SHIELD | Admitting: Physician Assistant

## 2016-10-25 LAB — CMP14+EGFR
ALT: 29 IU/L (ref 0–44)
AST: 16 IU/L (ref 0–40)
Albumin/Globulin Ratio: 1.9 (ref 1.2–2.2)
Albumin: 4.8 g/dL (ref 3.5–5.5)
Alkaline Phosphatase: 73 IU/L (ref 39–117)
BUN/Creatinine Ratio: 16 (ref 9–20)
BUN: 16 mg/dL (ref 6–24)
Bilirubin Total: 0.6 mg/dL (ref 0.0–1.2)
CO2: 25 mmol/L (ref 18–29)
Calcium: 9.8 mg/dL (ref 8.7–10.2)
Chloride: 100 mmol/L (ref 96–106)
Creatinine, Ser: 1.02 mg/dL (ref 0.76–1.27)
GFR calc Af Amer: 99 mL/min/{1.73_m2} (ref >59)
GFR calc non Af Amer: 86 mL/min/{1.73_m2} (ref >59)
Globulin, Total: 2.5 g/dL (ref 1.5–4.5)
Glucose: 163 mg/dL — ABNORMAL HIGH (ref 65–99)
Potassium: 5.3 mmol/L — ABNORMAL HIGH (ref 3.5–5.2)
Sodium: 143 mmol/L (ref 134–144)
Total Protein: 7.3 g/dL (ref 6.0–8.5)

## 2016-10-25 LAB — LIPID PANEL
Chol/HDL Ratio: 3.5 ratio units (ref 0.0–5.0)
Cholesterol, Total: 179 mg/dL (ref 100–199)
HDL: 51 mg/dL (ref >39)
LDL Calculated: 102 mg/dL — ABNORMAL HIGH (ref 0–99)
Triglycerides: 132 mg/dL (ref 0–149)
VLDL Cholesterol Cal: 26 mg/dL (ref 5–40)

## 2016-10-25 LAB — MICROALBUMIN / CREATININE URINE RATIO
Creatinine, Urine: 230 mg/dL
Microalb/Creat Ratio: 51 mg/g creat — ABNORMAL HIGH (ref 0.0–30.0)
Microalbumin, Urine: 117.3 ug/mL

## 2016-12-23 ENCOUNTER — Encounter: Payer: Self-pay | Admitting: Physician Assistant

## 2016-12-23 MED ORDER — DULOXETINE HCL 30 MG PO CPEP: 30.0000 mg | ORAL_CAPSULE | Freq: Every day | ORAL | 1 refills | Status: DC

## 2016-12-27 ENCOUNTER — Other Ambulatory Visit: Payer: Self-pay | Admitting: Physician Assistant

## 2017-01-24 ENCOUNTER — Encounter: Payer: Self-pay | Admitting: Physician Assistant

## 2017-01-24 ENCOUNTER — Ambulatory Visit (INDEPENDENT_AMBULATORY_CARE_PROVIDER_SITE_OTHER): Payer: BLUE CROSS/BLUE SHIELD | Admitting: Physician Assistant

## 2017-01-24 ENCOUNTER — Telehealth: Payer: Self-pay | Admitting: Physician Assistant

## 2017-01-24 VITALS — BP 159/86 | HR 109 | Temp 98.3°F | Ht 70.0 in | Wt 227.0 lb

## 2017-01-24 DIAGNOSIS — M48061 Spinal stenosis, lumbar region without neurogenic claudication: Secondary | ICD-10-CM | POA: Diagnosis not present

## 2017-01-24 DIAGNOSIS — E1029 Type 1 diabetes mellitus with other diabetic kidney complication: Secondary | ICD-10-CM

## 2017-01-24 DIAGNOSIS — R809 Proteinuria, unspecified: Secondary | ICD-10-CM

## 2017-01-24 DIAGNOSIS — M5136 Other intervertebral disc degeneration, lumbar region: Secondary | ICD-10-CM

## 2017-01-24 LAB — BAYER DCA HB A1C WAIVED: HB A1C (BAYER DCA - WAIVED): 7.3 % — ABNORMAL HIGH (ref <7.0)

## 2017-01-24 MED ORDER — RAMIPRIL 10 MG PO CAPS: 10.0000 mg | ORAL_CAPSULE | Freq: Every day | ORAL | 11 refills | Status: DC

## 2017-01-24 MED ORDER — OXYCODONE-ACETAMINOPHEN 10-325 MG PO TABS: 1.0000 | ORAL_TABLET | Freq: Four times a day (QID) | ORAL | 0 refills | Status: DC | PRN

## 2017-01-24 MED ORDER — GABAPENTIN 800 MG PO TABS
800.0000 mg | ORAL_TABLET | Freq: Three times a day (TID) | ORAL | 5 refills | Status: DC
Start: 2017-01-24 — End: 2017-07-28

## 2017-01-24 NOTE — Telephone Encounter (Signed)
Is the dentist on call? Until Monday can double up on the pain med we give.  Then follow up with them.

## 2017-01-24 NOTE — Telephone Encounter (Signed)
Pt was seen this AM Had root canal on Tuesday, now in extreme pain Called dentist but office is closed Please advise

## 2017-01-24 NOTE — Patient Instructions (Signed)
In a few days you may receive a survey in the mail or online from [employer] regarding your visit with us today. Please take a moment to fill this out. Your feedback is very important to our whole office. It can help us better understand your needs as well as improve your experience and satisfaction. Thank you for taking your time to complete it. We care about you.  Simra Fiebig, PA-C  

## 2017-01-25 ENCOUNTER — Other Ambulatory Visit: Payer: Self-pay | Admitting: Physician Assistant

## 2017-01-26 NOTE — Progress Notes (Signed)
BP (!) 159/86   Pulse (!) 109   Temp 98.3 F (36.8 C) (Oral)   Ht 5\' 10"  (1.778 m)   Wt 227 lb (103 kg)   BMI 32.57 kg/m    Subjective:    Patient ID: John Ware, male    DOB: July 06, 1966, 51 y.o.   MRN: [id number]  HPI: John Ware is a 51 y.o. male presenting on 01/24/2017 for Follow-up (3 month ) This patient comes in for periodic recheck on medications and conditions including DDD, spinal stenosis, DM, depression.  He reports doing well overall. Stable with activity and pain level.  Sugars have ran well, due labs today.   All medications are reviewed today. There are no reports of any problems with the medications. All of the medical conditions are reviewed and updated.  Lab work is reviewed and will be ordered as medically necessary. There are no new problems reported with today's visit.    Relevant past medical, surgical, family and social history reviewed and updated as indicated. Allergies and medications reviewed and updated.  Past Medical History:  Diagnosis Date  . Depression   . Diabetes mellitus without complication (HCC)   . Hypertension   . Spinal stenosis     Past Surgical History:  Procedure Laterality Date  . LUMBAR LAMINECTOMY/DECOMPRESSION MICRODISCECTOMY N/A 12/13/2014   Procedure: LUMBAR THREE-FOUR, LUMBAR FOUR-FIVE, LUMBAR FIVE-SACRAL ONE LAMINECTOMY;  Surgeon: [name], MD;  Location: [facility];  Service: Neurosurgery;  Laterality: N/A;  L3-4 L4-5 L5-S1 Laminectomy  . 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 VASECTOMY      Review of Systems  Constitutional: Negative.  Negative for appetite change and fatigue.  HENT: Negative.   Eyes: Negative.  Negative for pain and visual disturbance.  Respiratory: Negative.  Negative for cough, chest tightness, shortness of breath and wheezing.   Cardiovascular: Negative.  Negative for chest pain, palpitations and leg swelling.  Gastrointestinal: Negative.  Negative for abdominal  pain, diarrhea, nausea and vomiting.  Endocrine: Negative.   Genitourinary: Negative.   Musculoskeletal: Negative.   Skin: Negative.  Negative for color change and rash.  Neurological: Negative.  Negative for weakness, numbness and headaches.  Psychiatric/Behavioral: Negative.     Allergies as of 01/24/2017   No Known Allergies     Medication List       Accurate as of 01/24/17 11:59 PM. Always use your most recent med list.          amLODipine 5 MG tablet Commonly known as:  NORVASC TAKE 1 TABLET DAILY   baclofen 10 MG tablet Commonly known as:  LIORESAL Take 1 tablet (10 mg total) by mouth 2 (two) times daily.   buPROPion 150 MG 24 hr tablet Commonly known as:  WELLBUTRIN XL Take 1 tablet (150 mg total) by mouth 2 (two) times daily.   gabapentin 800 MG tablet Commonly known as:  NEURONTIN Take 1 tablet (800 mg total) by mouth 3 (three) times daily.   metFORMIN 500 MG tablet Commonly known as:  GLUCOPHAGE TAKE  (1)  TABLET  THREE TIMES DAILY WITH MEALS.   oxyCODONE-acetaminophen 10-325 MG tablet Commonly known as:  PERCOCET Take 1 tablet by mouth every 6 (six) hours as needed for pain.   oxyCODONE-acetaminophen 10-325 MG tablet Commonly known as:  PERCOCET Take 1 tablet by mouth every 6 (six) hours as needed for pain.   oxyCODONE-acetaminophen 10-325 MG tablet Commonly known as:  PERCOCET Take 1 tablet  by mouth every 6 (six) hours as needed for pain.   ramipril 10 MG capsule Commonly known as:  ALTACE Take 1 capsule (10 mg total) by mouth daily.          Objective:    BP (!) 159/86   Pulse (!) 109   Temp 98.3 F (36.8 C) (Oral)   Ht 5\' 10"  (1.778 m)   Wt 227 lb (103 kg)   BMI 32.57 kg/m   No Known Allergies  Physical Exam  Constitutional: He appears well-developed and well-nourished.  HENT:  Head: Normocephalic and atraumatic.  Eyes: Conjunctivae and EOM are normal. Pupils are equal, round, and reactive to light.  Neck: Normal range of motion.  Neck supple.  Cardiovascular: Normal rate, regular rhythm and normal heart sounds.   Pulmonary/Chest: Effort normal and breath sounds normal.  Abdominal: Soft. Bowel sounds are normal.  Musculoskeletal: Normal range of motion.  Skin: Skin is warm and dry.  Nursing note and vitals reviewed.   Results for orders placed or performed in visit on 01/24/17  Bayer DCA Hb A1c Waived  Result Value Ref Range   Bayer DCA Hb A1c Waived 7.3 (H) <7.0 %      Assessment & Plan:   1. DDD (degenerative disc disease), lumbar - oxyCODONE-acetaminophen (PERCOCET) 10-325 MG tablet; Take 1 tablet by mouth every 6 (six) hours as needed for pain.  Dispense: 120 tablet; Refill: 0 - oxyCODONE-acetaminophen (PERCOCET) 10-325 MG tablet; Take 1 tablet by mouth every 6 (six) hours as needed for pain.  Dispense: 120 tablet; Refill: 0 - oxyCODONE-acetaminophen (PERCOCET) 10-325 MG tablet; Take 1 tablet by mouth every 6 (six) hours as needed for pain.  Dispense: 120 tablet; Refill: 0  2. Spinal stenosis of lumbar region, unspecified whether neurogenic claudication present - oxyCODONE-acetaminophen (PERCOCET) 10-325 MG tablet; Take 1 tablet by mouth every 6 (six) hours as needed for pain.  Dispense: 120 tablet; Refill: 0 - oxyCODONE-acetaminophen (PERCOCET) 10-325 MG tablet; Take 1 tablet by mouth every 6 (six) hours as needed for pain.  Dispense: 120 tablet; Refill: 0 - oxyCODONE-acetaminophen (PERCOCET) 10-325 MG tablet; Take 1 tablet by mouth every 6 (six) hours as needed for pain.  Dispense: 120 tablet; Refill: 0  3. 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 Current Outpatient Prescriptions:  .  amLODipine (NORVASC) 5 MG tablet, TAKE 1 TABLET DAILY, Disp: 30 tablet, Rfl: 0 .  baclofen (LIORESAL) 10 MG tablet, Take 1 tablet (10 mg total) by mouth 2 (two) times daily., Disp: 60 each, Rfl: 5 .  buPROPion (WELLBUTRIN XL) 150 MG 24 hr tablet, Take 1 tablet (150 mg total) by  mouth 2 (two) times daily., Disp: 60 tablet, Rfl: 0 .  gabapentin (NEURONTIN) 800 MG tablet, Take 1 tablet (800 mg total) by mouth 3 (three) times daily., Disp: 90 tablet, Rfl: 5 .  metFORMIN (GLUCOPHAGE) 500 MG tablet, TAKE  (1)  TABLET  THREE TIMES DAILY WITH MEALS., Disp: 90 tablet, Rfl: 0 .  oxyCODONE-acetaminophen (PERCOCET) 10-325 MG tablet, Take 1 tablet by mouth every 6 (six) hours as needed for pain., Disp: 120 tablet, Rfl: 0 .  oxyCODONE-acetaminophen (PERCOCET) 10-325 MG tablet, Take 1 tablet by mouth every 6 (six) hours as needed for pain., Disp: 120 tablet, Rfl: 0 .  oxyCODONE-acetaminophen (PERCOCET) 10-325 MG tablet, Take 1 tablet by mouth every 6 (six) hours as needed for pain., Disp: 120 tablet, Rfl: 0 .  ramipril (ALTACE) 10 MG capsule, Take  1 capsule (10 mg total) by mouth daily., Disp: 30 capsule, Rfl: 11  Continue all other maintenance medications as listed above.  Follow up plan: Return in about 3 months (around 04/26/2017).  Educational handout given for survey  Remus Loffler PA-C [facility] [address]  [place], [place] [address] [phone]   01/26/2017, 8:52 PM

## 2017-01-30 ENCOUNTER — Other Ambulatory Visit: Payer: Self-pay | Admitting: Physician Assistant

## 2017-01-31 NOTE — Telephone Encounter (Signed)
Encounter one week old, no need to try to contact again.

## 2017-02-05 ENCOUNTER — Encounter: Payer: Self-pay | Admitting: Physician Assistant

## 2017-02-05 ENCOUNTER — Ambulatory Visit (INDEPENDENT_AMBULATORY_CARE_PROVIDER_SITE_OTHER): Payer: BLUE CROSS/BLUE SHIELD | Admitting: Physician Assistant

## 2017-02-05 VITALS — BP 132/83 | HR 88 | Temp 97.8°F | Ht 70.0 in | Wt 231.0 lb

## 2017-02-05 DIAGNOSIS — M48062 Spinal stenosis, lumbar region with neurogenic claudication: Secondary | ICD-10-CM | POA: Diagnosis not present

## 2017-02-05 DIAGNOSIS — M5137 Other intervertebral disc degeneration, lumbosacral region: Secondary | ICD-10-CM

## 2017-02-05 DIAGNOSIS — R29898 Other symptoms and signs involving the musculoskeletal system: Secondary | ICD-10-CM | POA: Diagnosis not present

## 2017-02-05 MED ORDER — KETOROLAC TROMETHAMINE 60 MG/2ML IM SOLN: 60.0000 mg | Freq: Once | INTRAMUSCULAR | Status: DC

## 2017-02-05 MED ORDER — METHYLPREDNISOLONE ACETATE 80 MG/ML IJ SUSP
80.0000 mg | Freq: Once | INTRAMUSCULAR | Status: AC
  Administered 2017-02-05: 80 mg via INTRAMUSCULAR

## 2017-02-05 MED ORDER — PREDNISONE 10 MG (48) PO TBPK: ORAL_TABLET | ORAL | 0 refills | Status: DC

## 2017-02-05 MED ORDER — TIZANIDINE HCL 4 MG PO CAPS: 4.0000 mg | ORAL_CAPSULE | Freq: Three times a day (TID) | ORAL | 2 refills | Status: DC

## 2017-02-05 MED ORDER — CLINDAMYCIN HCL 300 MG PO CAPS: 300.0000 mg | ORAL_CAPSULE | Freq: Three times a day (TID) | ORAL | 0 refills | Status: DC

## 2017-02-05 NOTE — Patient Instructions (Addendum)
Sciatica Sciatica is pain, numbness, weakness, or tingling along your sciatic nerve. The sciatic nerve starts in the lower back and goes down the back of each leg. Sciatica happens when this nerve is pinched or has pressure put on it. Sciatica usually goes away on its own or with treatment. Sometimes, sciatica may keep coming back (recur). Follow these instructions at home: Medicines  Take over-the-counter and prescription medicines only as told by your doctor.  Do not drive or use heavy machinery while taking prescription pain medicine. Managing pain  If directed, put ice on the affected area. ? Put ice in a plastic bag. ? Place a towel between your skin and the bag. ? Leave the ice on for 20 minutes, 2-3 times a day.  After icing, apply heat to the affected area before you exercise or as often as told by your doctor. Use the heat source that your doctor tells you to use, such as a moist heat pack or a heating pad. ? Place a towel between your skin and the heat source. ? Leave the heat on for 20-30 minutes. ? Remove the heat if your skin turns bright red. This is especially important if you are unable to feel pain, heat, or cold. You may have a greater risk of getting burned. Activity  Return to your normal activities as told by your doctor. Ask your doctor what activities are safe for you. ? Avoid activities that make your sciatica worse.  Take short rests during the day. Rest in a lying or standing position. This is usually better than sitting to rest. ? When you rest for a long time, do some physical activity or stretching between periods of rest. ? Avoid sitting for a long time without moving. Get up and move around at least one time each hour.  Exercise and stretch regularly, as told by your doctor.  Do not lift anything that is heavier than 10 lb (4.5 kg) while you have symptoms of sciatica. ? Avoid lifting heavy things even when you do not have symptoms. ? Avoid lifting heavy  things over and over.  When you lift objects, always lift in a way that is safe for your body. To do this, you should: ? Bend your knees. ? Keep the object close to your body. ? Avoid twisting. General instructions  Use good posture. ? Avoid leaning forward when you are sitting. ? Avoid hunching over when you are standing.  Stay at a healthy weight.  Wear comfortable shoes that support your feet. Avoid wearing high heels.  Avoid sleeping on a mattress that is too soft or too hard. You might have less pain if you sleep on a mattress that is firm enough to support your back.  Keep all follow-up visits as told by your doctor. This is important. Contact a doctor if:  You have pain that: ? Wakes you up when you are sleeping. ? Gets worse when you lie down. ? Is worse than the pain you have had in the past. ? Lasts longer than 4 weeks.  You lose weight for without trying. Get help right away if:  You cannot control when you pee (urinate) or poop (have a bowel movement).  You have weakness in any of these areas and it gets worse. ? Lower back. ? Lower belly (pelvis). ? Butt (buttocks). ? Legs.  You have redness or swelling of your back.  You have a burning feeling when you pee. This information is not intended to replace   advice given to you by your health care provider. Make sure you discuss any questions you have with your health care provider. Document Released: 05/21/2008 Document Revised: 01/18/2016 Document Reviewed: 04/21/2015 Elsevier Interactive Patient Education  2018 [employer] sciatica

## 2017-02-05 NOTE — Progress Notes (Signed)
BP 132/83   Pulse 88   Temp 97.8 F (36.6 C) (Oral)   Ht 5\' 10"  (1.778 m)   Wt 231 lb (104.8 kg)   BMI 33.15 kg/m    Subjective:    Patient ID: John Ware, male    DOB: Aug 03, 1966, 51 y.o.   MRN: [id number]  HPI: John Ware is a 51 y.o. male presenting on 02/05/2017 for left hip pain  This patient comes in for periodic recheck on medications and conditions including Degenerative associated spinal stenosis. Patient had surgery performed by [name] in the past on his low back. He has had a sudden change in the amount of pain that he said. He has not had any injury. He tested extremely careful about exercising well in keeping his weight under control. He also does not have a lot of lifting at work. He has work mates that lift for him. He is in a lot of pain today from 8 to a 10 out of a 10 scale. He has not had a recent MRI.Marland Kitchen   All medications are reviewed today. There are no reports of any problems with the medications. All of the medical conditions are reviewed and updated.  Lab work is reviewed and will be ordered as medically necessary. There are no new problems reported with today's visit.  Relevant past medical, surgical, family and social history reviewed and updated as indicated. Allergies and medications reviewed and updated.  Past Medical History:  Diagnosis Date  . Depression   . Diabetes mellitus without complication (HCC)   . Hypertension   . Spinal stenosis     Past Surgical History:  Procedure Laterality Date  . LUMBAR LAMINECTOMY/DECOMPRESSION MICRODISCECTOMY N/A 12/13/2014   Procedure: LUMBAR THREE-FOUR, LUMBAR FOUR-FIVE, LUMBAR FIVE-SACRAL ONE LAMINECTOMY;  Surgeon: [name], MD;  Location: [facility];  Service: Neurosurgery;  Laterality: N/A;  L3-4 L4-5 L5-S1 Laminectomy  . 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 VASECTOMY      Review of Systems  Constitutional: Negative for appetite change and fatigue.  HENT: Negative.     Eyes: Negative.  Negative for pain and visual disturbance.  Respiratory: Negative.  Negative for cough, chest tightness, shortness of breath and wheezing.   Cardiovascular: Negative.  Negative for chest pain, palpitations and leg swelling.  Gastrointestinal: Negative.  Negative for abdominal pain, diarrhea, nausea and vomiting.  Endocrine: Negative.   Genitourinary: Negative.   Musculoskeletal: Positive for arthralgias, back pain, gait problem, joint swelling and myalgias.  Skin: Negative.  Negative for color change and rash.  Neurological: Positive for weakness. Negative for numbness and headaches.  Psychiatric/Behavioral: Negative.     Allergies as of 02/05/2017   No Known Allergies     Medication List       Accurate as of 02/05/17  5:40 PM. Always use your most recent med list.          amLODipine 5 MG tablet Commonly known as:  NORVASC TAKE 1 TABLET DAILY   buPROPion 150 MG 24 hr tablet Commonly known as:  WELLBUTRIN XL Take 1 tablet (150 mg total) by mouth 2 (two) times daily.   clindamycin 300 MG capsule Commonly known as:  CLEOCIN Take 1 capsule (300 mg total) by mouth 3 (three) times daily.   gabapentin 800 MG tablet Commonly known as:  NEURONTIN Take 1 tablet (800 mg total) by mouth 3 (three) times daily.   metFORMIN 500 MG tablet Commonly known as:  GLUCOPHAGE TAKE  (1)  TABLET  THREE TIMES DAILY WITH MEALS.   oxyCODONE-acetaminophen 10-325 MG tablet Commonly known as:  PERCOCET Take 1 tablet by mouth every 6 (six) hours as needed for pain.   oxyCODONE-acetaminophen 10-325 MG tablet Commonly known as:  PERCOCET Take 1 tablet by mouth every 6 (six) hours as needed for pain.   oxyCODONE-acetaminophen 10-325 MG tablet Commonly known as:  PERCOCET Take 1 tablet by mouth every 6 (six) hours as needed for pain.   predniSONE 10 MG (48) Tbpk tablet Commonly known as:  STERAPRED UNI-PAK 48 TAB Take as directed for 12 days   ramipril 10 MG capsule Commonly  known as:  ALTACE Take 1 capsule (10 mg total) by mouth daily.   tiZANidine 4 MG capsule Commonly known as:  ZANAFLEX Take 1 capsule (4 mg total) by mouth 3 (three) times daily.          Objective:    BP 132/83   Pulse 88   Temp 97.8 F (36.6 C) (Oral)   Ht 5\' 10"  (1.778 m)   Wt 231 lb (104.8 kg)   BMI 33.15 kg/m   No Known Allergies  Physical Exam  Constitutional: He appears well-developed and well-nourished.  HENT:  Head: Normocephalic and atraumatic.  Eyes: Conjunctivae and EOM are normal. Pupils are equal, round, and reactive to light.  Neck: Normal range of motion. Neck supple.  Cardiovascular: Normal rate, regular rhythm and normal heart sounds.   Pulmonary/Chest: Effort normal and breath sounds normal.  Abdominal: Soft. Bowel sounds are normal.  Musculoskeletal: Normal range of motion.  Neurological: No sensory deficit. He exhibits abnormal muscle tone. He displays a negative Romberg sign.  Reflex Scores:      Patellar reflexes are 0 on the right side and 0 on the left side. Left leg quadriceps with 4/5 strength, right 5/5  Skin: Skin is warm and dry.  Nursing note and vitals reviewed.   Results for orders placed or performed in visit on 01/24/17  Bayer DCA Hb A1c Waived  Result Value Ref Range   Bayer DCA Hb A1c Waived 7.3 (H) <7.0 %      Assessment & Plan:   1. Spinal stenosis of lumbar region with neurogenic claudication - MR Lumbar Spine Wo Contrast; Future  2. DDD (degenerative disc disease), lumbosacral - MR Lumbar Spine Wo Contrast; Future - methylPREDNISolone acetate (DEPO-MEDROL) injection 80 mg; Inject 1 mL (80 mg total) into the muscle once.  3. Left leg weakness - MR Lumbar Spine Wo Contrast; Future PLAN APPOINTMENT with [facility] after MRI  Continue all other maintenance medications as listed above.  Follow up plan: Follow-up as needed or worsening of symptoms. Call office for any issues.  Educational handout given for  sciatica  Remus LofflerAngel S. Nahuel Wilbert PA-C [facility] [address]  [place], [place] [id number] [phone]   02/05/2017, 5:40 PM

## 2017-02-10 ENCOUNTER — Other Ambulatory Visit: Payer: Self-pay | Admitting: Physician Assistant

## 2017-02-12 ENCOUNTER — Ambulatory Visit
Admission: RE | Admit: 2017-02-12 | Discharge: 2017-02-12 | Disposition: A | Discharge status: Home / Self Care | Payer: BLUE CROSS/BLUE SHIELD | Source: Ambulatory Visit | Attending: Physician Assistant | Admitting: Physician Assistant

## 2017-02-12 DIAGNOSIS — M48062 Spinal stenosis, lumbar region with neurogenic claudication: Secondary | ICD-10-CM

## 2017-02-12 DIAGNOSIS — M5137 Other intervertebral disc degeneration, lumbosacral region: Secondary | ICD-10-CM

## 2017-02-12 DIAGNOSIS — R29898 Other symptoms and signs involving the musculoskeletal system: Secondary | ICD-10-CM

## 2017-02-13 ENCOUNTER — Encounter: Payer: Self-pay | Admitting: Physician Assistant

## 2017-02-14 ENCOUNTER — Other Ambulatory Visit: Payer: Self-pay | Admitting: *Deleted

## 2017-02-14 DIAGNOSIS — M5136 Other intervertebral disc degeneration, lumbar region: Secondary | ICD-10-CM

## 2017-02-14 DIAGNOSIS — M5126 Other intervertebral disc displacement, lumbar region: Secondary | ICD-10-CM

## 2017-02-14 MED ORDER — MAGIC MOUTHWASH: 5.0000 mL | Freq: Four times a day (QID) | ORAL | 0 refills | Status: DC

## 2017-02-17 ENCOUNTER — Ambulatory Visit (INDEPENDENT_AMBULATORY_CARE_PROVIDER_SITE_OTHER): Payer: BLUE CROSS/BLUE SHIELD | Admitting: Physician Assistant

## 2017-02-17 ENCOUNTER — Encounter: Payer: Self-pay | Admitting: Physician Assistant

## 2017-02-17 VITALS — BP 152/74 | HR 124 | Temp 97.1°F | Ht 70.0 in | Wt 211.0 lb

## 2017-02-17 DIAGNOSIS — L03113 Cellulitis of right upper limb: Secondary | ICD-10-CM | POA: Diagnosis not present

## 2017-02-17 DIAGNOSIS — M48061 Spinal stenosis, lumbar region without neurogenic claudication: Secondary | ICD-10-CM | POA: Diagnosis not present

## 2017-02-17 DIAGNOSIS — M51369 Other intervertebral disc degeneration, lumbar region without mention of lumbar back pain or lower extremity pain: Secondary | ICD-10-CM | POA: Insufficient documentation

## 2017-02-17 DIAGNOSIS — M5136 Other intervertebral disc degeneration, lumbar region: Secondary | ICD-10-CM

## 2017-02-17 MED ORDER — METHYLPREDNISOLONE ACETATE 80 MG/ML IJ SUSP
80.0000 mg | Freq: Once | INTRAMUSCULAR | Status: AC
  Administered 2017-02-17: 80 mg via INTRAMUSCULAR

## 2017-02-17 MED ORDER — SULFAMETHOXAZOLE-TRIMETHOPRIM 800-160 MG PO TABS: 1.0000 | ORAL_TABLET | Freq: Two times a day (BID) | ORAL | 0 refills | Status: DC

## 2017-02-17 MED ORDER — OXYCODONE-ACETAMINOPHEN 10-325 MG PO TABS: 1.0000 | ORAL_TABLET | Freq: Four times a day (QID) | ORAL | 0 refills | Status: DC | PRN

## 2017-02-17 NOTE — Progress Notes (Signed)
BP (!) 152/74   Pulse (!) 124   Temp 97.1 F (36.2 C) (Oral)   Ht 5\' 10"  (1.778 m)   Wt 211 lb (95.7 kg)   BMI 30.28 kg/m    Subjective:    Patient ID: John Ware, male    DOB: [birth year], 51 y.o.   MRN: [id number]  HPI: John Ware is a 51 y.o. male presenting on 02/17/2017 for discuss pain medication  Patient comes in for recheck on his back. He was found to have multiple levels of abnormality on this MRI. This is being sent to his neurosurgeon, [name]. Patient has had significant worsening and weakening in his lower leg. He has severe pain that is not able to have him rest at night. He has been taking Percocet 10/325 one 4 times a day. We are limited increase this to 1-2 tablets 4 times a day. The prescriptions have been written today. We will try to get him in as soon as possible to the neurosurgeon.  He has had a lesion on his right arm that drained and dried up fairly well with the clindamycin. Over the medicine did bother him and gave him some thrush. We will use a different medication for treatment this time.  Relevant past medical, surgical, family and social history reviewed and updated as indicated. Allergies and medications reviewed and updated.  Past Medical History:  Diagnosis Date  . Depression   . Diabetes mellitus without complication (HCC)   . Hypertension   . Spinal stenosis     Past Surgical History:  Procedure Laterality Date  . LUMBAR LAMINECTOMY/DECOMPRESSION MICRODISCECTOMY N/A 12/13/2014   Procedure: LUMBAR THREE-FOUR, LUMBAR FOUR-FIVE, LUMBAR FIVE-SACRAL ONE LAMINECTOMY;  Surgeon: [name], MD;  Location: [facility];  Service: Neurosurgery;  Laterality: N/A;  L3-4 L4-5 L5-S1 Laminectomy  . 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 VASECTOMY      Review of Systems  Constitutional: Negative.  Negative for appetite change and fatigue.  Eyes: Negative for pain and visual disturbance.  Respiratory: Negative.  Negative for  cough, chest tightness, shortness of breath and wheezing.   Cardiovascular: Negative.  Negative for chest pain, palpitations and leg swelling.  Gastrointestinal: Negative.  Negative for abdominal pain, diarrhea, nausea and vomiting.  Genitourinary: Negative.   Musculoskeletal: Positive for arthralgias, back pain, gait problem and myalgias.  Skin: Negative.  Negative for color change and rash.  Neurological: Negative for weakness, numbness and headaches.  Psychiatric/Behavioral: Negative.     Allergies as of 02/17/2017   No Known Allergies     Medication List       Accurate as of 02/17/17  1:11 PM. Always use your most recent med list.          amLODipine 5 MG tablet Commonly known as:  NORVASC TAKE 1 TABLET DAILY   buPROPion 150 MG 24 hr tablet Commonly known as:  WELLBUTRIN XL Take 1 tablet (150 mg total) by mouth 2 (two) times daily.   gabapentin 800 MG tablet Commonly known as:  NEURONTIN Take 1 tablet (800 mg total) by mouth 3 (three) times daily.   magic mouthwash Soln Take 5 mLs by mouth 4 (four) times daily.   metFORMIN 500 MG tablet Commonly known as:  GLUCOPHAGE TAKE  (1)  TABLET  THREE TIMES DAILY WITH MEALS.   oxyCODONE-acetaminophen 10-325 MG tablet Commonly known as:  PERCOCET Take 1-2 tablets by mouth every 6 (six) hours as needed for  pain.   oxyCODONE-acetaminophen 10-325 MG tablet Commonly known as:  PERCOCET Take 1-2 tablets by mouth every 6 (six) hours as needed for pain.   oxyCODONE-acetaminophen 10-325 MG tablet Commonly known as:  PERCOCET Take 1-2 tablets by mouth every 6 (six) hours as needed for pain.   ramipril 10 MG capsule Commonly known as:  ALTACE Take 1 capsule (10 mg total) by mouth daily.   sulfamethoxazole-trimethoprim 800-160 MG tablet Commonly known as:  BACTRIM DS Take 1 tablet by mouth 2 (two) times daily.   tiZANidine 4 MG capsule Commonly known as:  ZANAFLEX Take 1 capsule (4 mg total) by mouth 3 (three) times  daily.          Objective:    BP (!) 152/74   Pulse (!) 124   Temp 97.1 F (36.2 C) (Oral)   Ht 5\' 10"  (1.778 m)   Wt 211 lb (95.7 kg)   BMI 30.28 kg/m   No Known Allergies  Physical Exam  Constitutional: He is oriented to person, place, and time. He appears well-developed and well-nourished. No distress.  HENT:  Head: Normocephalic and atraumatic.  Eyes: Conjunctivae and EOM are normal. Pupils are equal, round, and reactive to light.  Cardiovascular: Normal rate, regular rhythm and normal heart sounds.   Pulmonary/Chest: Effort normal and breath sounds normal. No respiratory distress.  Musculoskeletal:       Lumbar back: He exhibits decreased range of motion, tenderness, pain and spasm.  Neurological: He is oriented to person, place, and time.  Reflex Scores:      Patellar reflexes are 1+ on the right side and 0 on the left side. Skin: Skin is warm and dry.  Psychiatric: He has a normal mood and affect. His behavior is normal.  Nursing note and vitals reviewed.   Results for orders placed or performed in visit on 01/24/17  Bayer DCA Hb A1c Waived  Result Value Ref Range   Bayer DCA Hb A1c Waived 7.3 (H) <7.0 %      Assessment & Plan:   1. DDD (degenerative disc disease), lumbar - oxyCODONE-acetaminophen (PERCOCET) 10-325 MG tablet; Take 1-2 tablets by mouth every 6 (six) hours as needed for pain.  Dispense: 240 tablet; Refill: 0 - oxyCODONE-acetaminophen (PERCOCET) 10-325 MG tablet; Take 1-2 tablets by mouth every 6 (six) hours as needed for pain.  Dispense: 240 tablet; Refill: 0 - oxyCODONE-acetaminophen (PERCOCET) 10-325 MG tablet; Take 1-2 tablets by mouth every 6 (six) hours as needed for pain.  Dispense: 240 tablet; Refill: 0 - methylPREDNISolone acetate (DEPO-MEDROL) injection 80 mg; Inject 1 mL (80 mg total) into the muscle once. Referral to Dr. Danielle DessElsner changed urgent. Our office will contact them as soon as possible  2. Spinal stenosis of lumbar region,  unspecified whether neurogenic claudication present - oxyCODONE-acetaminophen (PERCOCET) 10-325 MG tablet; Take 1-2 tablets by mouth every 6 (six) hours as needed for pain.  Dispense: 240 tablet; Refill: 0 - oxyCODONE-acetaminophen (PERCOCET) 10-325 MG tablet; Take 1-2 tablets by mouth every 6 (six) hours as needed for pain.  Dispense: 240 tablet; Refill: 0 - oxyCODONE-acetaminophen (PERCOCET) 10-325 MG tablet; Take 1-2 tablets by mouth every 6 (six) hours as needed for pain.  Dispense: 240 tablet; Refill: 0 - methylPREDNISolone acetate (DEPO-MEDROL) injection 80 mg; Inject 1 mL (80 mg total) into the muscle once.  3. Cellulitis of right upper extremity - sulfamethoxazole-trimethoprim (BACTRIM DS) 800-160 MG tablet; Take 1 tablet by mouth 2 (two) times daily.  Dispense: 20 tablet; Refill: 0  Current Outpatient Prescriptions:  .  amLODipine (NORVASC) 5 MG tablet, TAKE 1 TABLET DAILY, Disp: 30 tablet, Rfl: 5 .  buPROPion (WELLBUTRIN XL) 150 MG 24 hr tablet, Take 1 tablet (150 mg total) by mouth 2 (two) times daily., Disp: 60 tablet, Rfl: 0 .  gabapentin (NEURONTIN) 800 MG tablet, Take 1 tablet (800 mg total) by mouth 3 (three) times daily., Disp: 90 tablet, Rfl: 5 .  magic mouthwash SOLN, Take 5 mLs by mouth 4 (four) times daily., Disp: 240 mL, Rfl: 0 .  metFORMIN (GLUCOPHAGE) 500 MG tablet, TAKE  (1)  TABLET  THREE TIMES DAILY WITH MEALS., Disp: 90 tablet, Rfl: 0 .  oxyCODONE-acetaminophen (PERCOCET) 10-325 MG tablet, Take 1-2 tablets by mouth every 6 (six) hours as needed for pain., Disp: 240 tablet, Rfl: 0 .  oxyCODONE-acetaminophen (PERCOCET) 10-325 MG tablet, Take 1-2 tablets by mouth every 6 (six) hours as needed for pain., Disp: 240 tablet, Rfl: 0 .  oxyCODONE-acetaminophen (PERCOCET) 10-325 MG tablet, Take 1-2 tablets by mouth every 6 (six) hours as needed for pain., Disp: 240 tablet, Rfl: 0 .  ramipril (ALTACE) 10 MG capsule, Take 1 capsule (10 mg total) by mouth daily., Disp: 30 capsule,  Rfl: 11 .  tiZANidine (ZANAFLEX) 4 MG capsule, Take 1 capsule (4 mg total) by mouth 3 (three) times daily., Disp: 60 capsule, Rfl: 2 .  sulfamethoxazole-trimethoprim (BACTRIM DS) 800-160 MG tablet, Take 1 tablet by mouth 2 (two) times daily., Disp: 20 tablet, Rfl: 0  Continue all other maintenance medications as listed above.  Follow up plan: 3 months  Educational handout given for DDD  Remus Loffler PA-C [facility] [address]  [place], [place] [address] [phone]   02/17/2017, 1:11 PM

## 2017-02-17 NOTE — Patient Instructions (Signed)
In a few days you may receive a survey in the mail or online from [employer] regarding your visit with us today. Please take a moment to fill this out. Your feedback is very important to our whole office. It can help us better understand your needs as well as improve your experience and satisfaction. Thank you for taking your time to complete it. We care about you.  Jahmad Petrich, PA-C  

## 2017-02-18 DIAGNOSIS — Z029 Encounter for administrative examinations, unspecified: Secondary | ICD-10-CM

## 2017-02-22 ENCOUNTER — Other Ambulatory Visit: Payer: Self-pay | Admitting: Physician Assistant

## 2017-04-10 ENCOUNTER — Other Ambulatory Visit: Payer: Self-pay | Admitting: Neurological Surgery

## 2017-04-16 ENCOUNTER — Other Ambulatory Visit: Payer: Self-pay | Admitting: Physician Assistant

## 2017-04-30 ENCOUNTER — Ambulatory Visit (INDEPENDENT_AMBULATORY_CARE_PROVIDER_SITE_OTHER): Payer: Commercial Managed Care - PPO | Admitting: Physician Assistant

## 2017-04-30 ENCOUNTER — Encounter: Payer: Self-pay | Admitting: Physician Assistant

## 2017-04-30 VITALS — BP 126/78 | HR 93 | Temp 98.1°F | Ht 70.0 in | Wt 219.0 lb

## 2017-04-30 DIAGNOSIS — E785 Hyperlipidemia, unspecified: Secondary | ICD-10-CM | POA: Diagnosis not present

## 2017-04-30 DIAGNOSIS — M5136 Other intervertebral disc degeneration, lumbar region: Secondary | ICD-10-CM | POA: Diagnosis not present

## 2017-04-30 DIAGNOSIS — M48061 Spinal stenosis, lumbar region without neurogenic claudication: Secondary | ICD-10-CM | POA: Diagnosis not present

## 2017-04-30 DIAGNOSIS — E1169 Type 2 diabetes mellitus with other specified complication: Secondary | ICD-10-CM

## 2017-04-30 DIAGNOSIS — M5137 Other intervertebral disc degeneration, lumbosacral region: Secondary | ICD-10-CM

## 2017-04-30 LAB — BAYER DCA HB A1C WAIVED: HB A1C (BAYER DCA - WAIVED): 7.7 % — ABNORMAL HIGH (ref <7.0)

## 2017-04-30 MED ORDER — OXYCODONE-ACETAMINOPHEN 10-325 MG PO TABS: 1.0000 | ORAL_TABLET | Freq: Four times a day (QID) | ORAL | 0 refills | Status: DC | PRN

## 2017-04-30 MED ORDER — DULOXETINE HCL 30 MG PO CPEP: 30.0000 mg | ORAL_CAPSULE | Freq: Every day | ORAL | 3 refills | Status: DC

## 2017-04-30 MED ORDER — METFORMIN HCL 500 MG PO TABS: ORAL_TABLET | ORAL | 3 refills | Status: DC

## 2017-04-30 MED ORDER — BACLOFEN 10 MG PO TABS: 10.0000 mg | ORAL_TABLET | Freq: Two times a day (BID) | ORAL | 2 refills | Status: DC

## 2017-04-30 MED ORDER — BUPROPION HCL ER (XL) 150 MG PO TB24: ORAL_TABLET | ORAL | 11 refills | Status: DC

## 2017-04-30 NOTE — Patient Instructions (Signed)
In a few days you may receive a survey in the mail or online from [employer] regarding your visit with us today. Please take a moment to fill this out. Your feedback is very important to our whole office. It can help us better understand your needs as well as improve your experience and satisfaction. Thank you for taking your time to complete it. We care about you.  Yareli Carthen, PA-C  

## 2017-04-30 NOTE — Progress Notes (Signed)
BP 126/78   Pulse 93   Temp 98.1 F (36.7 C) (Oral)   Ht _0  (1.778 m)   Wt 219 lb (99.3 kg)   BMI 31.42 kg/m    Subjective:    Patient ID: John Ware, male    DOB: 1966-02-06, 51 y.o.   MRN: [id number]  HPI: John Ware is a 51 y.o. male presenting on 04/30/2017 for Follow-up (3 month )  This patient comes in for periodic recheck on medications and conditions including Spinal stenosis and degenerative disc disease. He'll be having surgery 08/11/2017. Dr. Ellene Route will be performing. He would like to start the Cymbalta that we have discussed in the past. We are hoping this will help depressions symptoms but also reduce pain. He is also due for labs today. Overall sugar control is been quite good. He is still working  Chronic pain is under control, patient has no new complaints. Medications are keeping things stable. Needs refills for the next three months.  [place] Controlled Substance website checked and normal. Drug screen normal this year.   All medications are reviewed today. There are no reports of any problems with the medications. All of the medical conditions are reviewed and updated.  Lab work is reviewed and will be ordered as medically necessary. There are no new problems reported with today's visit.  Relevant past medical, surgical, family and social history reviewed and updated as indicated. Allergies and medications reviewed and updated.  Past Medical History:  Diagnosis Date  . Depression   . Diabetes mellitus without complication ([place])   . Hypertension   . Spinal stenosis     Past Surgical History:  Procedure Laterality Date  . LUMBAR LAMINECTOMY/DECOMPRESSION MICRODISCECTOMY N/A 12/13/2014   Procedure: LUMBAR THREE-FOUR, LUMBAR FOUR-FIVE, LUMBAR FIVE-SACRAL ONE LAMINECTOMY;  Surgeon: Kristeen Miss, MD;  Location: [facility];  Service: Neurosurgery;  Laterality: N/A;  L3-4 L4-5 L5-S1 Laminectomy  . 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  VASECTOMY      Review of Systems  Constitutional: Negative.  Negative for appetite change and fatigue.  HENT: Negative.   Eyes: Negative.  Negative for pain and visual disturbance.  Respiratory: Negative.  Negative for cough, chest tightness, shortness of breath and wheezing.   Cardiovascular: Negative.  Negative for chest pain, palpitations and leg swelling.  Gastrointestinal: Negative.  Negative for abdominal pain, diarrhea, nausea and vomiting.  Endocrine: Negative.   Genitourinary: Negative.   Musculoskeletal: Positive for arthralgias, back pain and myalgias.  Skin: Negative.  Negative for color change and rash.  Neurological: Negative.  Negative for weakness, numbness and headaches.  Psychiatric/Behavioral: Negative.     Allergies as of 04/30/2017   No Known Allergies     Medication List       Accurate as of 04/30/17  9:49 AM. Always use your most recent med list.          amLODipine 5 MG tablet Commonly known as:  NORVASC TAKE 1 TABLET DAILY   baclofen 10 MG tablet Commonly known as:  LIORESAL Take 1 tablet (10 mg total) by mouth 2 (two) times daily.   buPROPion 150 MG 24 hr tablet Commonly known as:  WELLBUTRIN XL TAKE  (1)  TABLET TWICE A DAY.   DULoxetine 30 MG capsule Commonly known as:  CYMBALTA Take 1-2 capsules (30-60 mg total) by mouth daily.   gabapentin 800 MG tablet Commonly known as:  NEURONTIN Take 1 tablet (800 mg total)  by mouth 3 (three) times daily.   metFORMIN 500 MG tablet Commonly known as:  GLUCOPHAGE TAKE  (1)  TABLET  THREE TIMES DAILY WITH MEALS.   oxyCODONE-acetaminophen 10-325 MG tablet Commonly known as:  PERCOCET Take 1-2 tablets by mouth every 6 (six) hours as needed for pain.   oxyCODONE-acetaminophen 10-325 MG tablet Commonly known as:  PERCOCET Take 1-2 tablets by mouth every 6 (six) hours as needed for pain.   oxyCODONE-acetaminophen 10-325 MG tablet Commonly known as:  PERCOCET Take 1-2 tablets by mouth every 6 (six)  hours as needed for pain.   ramipril 10 MG capsule Commonly known as:  ALTACE Take 1 capsule (10 mg total) by mouth daily.   tiZANidine 4 MG capsule Commonly known as:  ZANAFLEX Take 1 capsule (4 mg total) by mouth 3 (three) times daily.            Discharge Care Instructions        Start     Ordered   04/30/17 0000  oxyCODONE-acetaminophen (PERCOCET) 10-325 MG tablet  Every 6 hours PRN    Comments:  Fill 30 days from original script date  Question:  Supervising Provider  Answer:  Timmothy Euler   04/30/17 0853   04/30/17 0000  oxyCODONE-acetaminophen (PERCOCET) 10-325 MG tablet  Every 6 hours PRN    Question:  Supervising Provider  Answer:  Timmothy Euler   04/30/17 0853   04/30/17 0000  oxyCODONE-acetaminophen (PERCOCET) 10-325 MG tablet  Every 6 hours PRN    Comments:  Fill 60 days from original script date  Question:  Supervising Provider  Answer:  Timmothy Euler   04/30/17 0853   04/30/17 0000  DULoxetine (CYMBALTA) 30 MG capsule  Daily    Question:  Supervising Provider  Answer:  Timmothy Euler   04/30/17 0853   04/30/17 0000  baclofen (LIORESAL) 10 MG tablet  2 times daily    Question:  Supervising Provider  Answer:  Timmothy Euler   04/30/17 0853   04/30/17 0000  buPROPion (WELLBUTRIN XL) 150 MG 24 hr tablet    Question:  Supervising Provider  Answer:  Timmothy Euler   04/30/17 0853   04/30/17 0000  metFORMIN (GLUCOPHAGE) 500 MG tablet    Question:  Supervising Provider  Answer:  Timmothy Euler   04/30/17 0853   04/30/17 0000  CBC with Differential/Platelet     04/30/17 0855   04/30/17 0000  CMP14+EGFR     04/30/17 0855   04/30/17 0000  Bayer DCA Hb A1c Waived     04/30/17 0855   04/30/17 0000  Microalbumin / creatinine urine ratio     04/30/17 0855         Objective:    BP 126/78   Pulse 93   Temp 98.1 F (36.7 C) (Oral)   Ht _0  (1.778 m)   Wt 219 lb (99.3 kg)   BMI 31.42 kg/m   No Known Allergies  Physical  Exam  Constitutional: He appears well-developed and well-nourished. No distress.  HENT:  Head: Normocephalic and atraumatic.  Eyes: Pupils are equal, round, and reactive to light. Conjunctivae and EOM are normal.  Cardiovascular: Normal rate, regular rhythm and normal heart sounds.   Pulmonary/Chest: Effort normal and breath sounds normal. No respiratory distress.  Skin: Skin is warm and dry.  Psychiatric: He has a normal mood and affect. His behavior is normal.  Nursing note and vitals reviewed.   Results for orders  placed or performed in visit on 01/24/17  Bayer DCA Hb A1c Waived  Result Value Ref Range   Bayer DCA Hb A1c Waived 7.3 (H) <7.0 %      Assessment & Plan:   1. DDD (degenerative disc disease), lumbar - oxyCODONE-acetaminophen (PERCOCET) 10-325 MG tablet; Take 1-2 tablets by mouth every 6 (six) hours as needed for pain.  Dispense: 240 tablet; Refill: 0 - oxyCODONE-acetaminophen (PERCOCET) 10-325 MG tablet; Take 1-2 tablets by mouth every 6 (six) hours as needed for pain.  Dispense: 240 tablet; Refill: 0 - oxyCODONE-acetaminophen (PERCOCET) 10-325 MG tablet; Take 1-2 tablets by mouth every 6 (six) hours as needed for pain.  Dispense: 240 tablet; Refill: 0  2. Spinal stenosis of lumbar region, unspecified whether neurogenic claudication present - oxyCODONE-acetaminophen (PERCOCET) 10-325 MG tablet; Take 1-2 tablets by mouth every 6 (six) hours as needed for pain.  Dispense: 240 tablet; Refill: 0 - oxyCODONE-acetaminophen (PERCOCET) 10-325 MG tablet; Take 1-2 tablets by mouth every 6 (six) hours as needed for pain.  Dispense: 240 tablet; Refill: 0 - oxyCODONE-acetaminophen (PERCOCET) 10-325 MG tablet; Take 1-2 tablets by mouth every 6 (six) hours as needed for pain.  Dispense: 240 tablet; Refill: 0  3. Hyperlipidemia associated with type 2 diabetes mellitus ([place]) - CBC with Differential/Platelet - CMP14+EGFR - Bayer DCA Hb A1c Waived - Microalbumin / creatinine urine  ratio  4. DDD (degenerative disc disease), lumbosacral    Current Outpatient Prescriptions:  .  amLODipine (NORVASC) 5 MG tablet, TAKE 1 TABLET DAILY, Disp: 30 tablet, Rfl: 5 .  baclofen (LIORESAL) 10 MG tablet, Take 1 tablet (10 mg total) by mouth 2 (two) times daily., Disp: 60 each, Rfl: 2 .  buPROPion (WELLBUTRIN XL) 150 MG 24 hr tablet, TAKE  (1)  TABLET TWICE A DAY., Disp: 60 tablet, Rfl: 11 .  gabapentin (NEURONTIN) 800 MG tablet, Take 1 tablet (800 mg total) by mouth 3 (three) times daily., Disp: 90 tablet, Rfl: 5 .  metFORMIN (GLUCOPHAGE) 500 MG tablet, TAKE  (1)  TABLET  THREE TIMES DAILY WITH MEALS., Disp: 90 tablet, Rfl: 3 .  oxyCODONE-acetaminophen (PERCOCET) 10-325 MG tablet, Take 1-2 tablets by mouth every 6 (six) hours as needed for pain., Disp: 240 tablet, Rfl: 0 .  oxyCODONE-acetaminophen (PERCOCET) 10-325 MG tablet, Take 1-2 tablets by mouth every 6 (six) hours as needed for pain., Disp: 240 tablet, Rfl: 0 .  oxyCODONE-acetaminophen (PERCOCET) 10-325 MG tablet, Take 1-2 tablets by mouth every 6 (six) hours as needed for pain., Disp: 240 tablet, Rfl: 0 .  ramipril (ALTACE) 10 MG capsule, Take 1 capsule (10 mg total) by mouth daily., Disp: 30 capsule, Rfl: 11 .  tiZANidine (ZANAFLEX) 4 MG capsule, Take 1 capsule (4 mg total) by mouth 3 (three) times daily., Disp: 60 capsule, Rfl: 2 .  DULoxetine (CYMBALTA) 30 MG capsule, Take 1-2 capsules (30-60 mg total) by mouth daily., Disp: 60 capsule, Rfl: 3 Continue all other maintenance medications as listed above.  Follow up plan: Return in about 3 months (around 07/30/2017) for recheck.  Educational handout given for [place] PA-C [facility] [address]  [place], [place] [address] [phone]   04/30/2017, 9:49 AM

## 2017-05-01 LAB — CMP14+EGFR
ALT: 25 IU/L (ref 0–44)
AST: 18 IU/L (ref 0–40)
Albumin/Globulin Ratio: 2.1 (ref 1.2–2.2)
Albumin: 4.6 g/dL (ref 3.5–5.5)
Alkaline Phosphatase: 80 IU/L (ref 39–117)
BUN/Creatinine Ratio: 16 (ref 9–20)
BUN: 13 mg/dL (ref 6–24)
Bilirubin Total: 0.4 mg/dL (ref 0.0–1.2)
CO2: 23 mmol/L (ref 20–29)
Calcium: 9.4 mg/dL (ref 8.7–10.2)
Chloride: 102 mmol/L (ref 96–106)
Creatinine, Ser: 0.79 mg/dL (ref 0.76–1.27)
GFR calc Af Amer: 122 mL/min/{1.73_m2} (ref >59)
GFR calc non Af Amer: 105 mL/min/{1.73_m2} (ref >59)
Globulin, Total: 2.2 g/dL (ref 1.5–4.5)
Glucose: 165 mg/dL — ABNORMAL HIGH (ref 65–99)
Potassium: 4.8 mmol/L (ref 3.5–5.2)
Sodium: 142 mmol/L (ref 134–144)
Total Protein: 6.8 g/dL (ref 6.0–8.5)

## 2017-05-01 LAB — CBC WITH DIFFERENTIAL/PLATELET
Basophils Absolute: 0 10*3/uL (ref 0.0–0.2)
Basos: 0 %
EOS (ABSOLUTE): 0.1 10*3/uL (ref 0.0–0.4)
Eos: 1 %
Hematocrit: 40.6 % (ref 37.5–51.0)
Hemoglobin: 14.3 g/dL (ref 13.0–17.7)
Immature Grans (Abs): 0 10*3/uL (ref 0.0–0.1)
Immature Granulocytes: 0 %
Lymphocytes Absolute: 2.8 10*3/uL (ref 0.7–3.1)
Lymphs: 26 %
MCH: 31.3 pg (ref 26.6–33.0)
MCHC: 35.2 g/dL (ref 31.5–35.7)
MCV: 89 fL (ref 79–97)
Monocytes Absolute: 0.8 10*3/uL (ref 0.1–0.9)
Monocytes: 7 %
Neutrophils Absolute: 7 10*3/uL (ref 1.4–7.0)
Neutrophils: 66 %
Platelets: 308 10*3/uL (ref 150–379)
RBC: 4.57 x10E6/uL (ref 4.14–5.80)
RDW: 13.5 % (ref 12.3–15.4)
WBC: 10.8 10*3/uL (ref 3.4–10.8)

## 2017-05-01 LAB — MICROALBUMIN / CREATININE URINE RATIO
Creatinine, Urine: 37.8 mg/dL
Microalb/Creat Ratio: 87.6 mg/g creat — ABNORMAL HIGH (ref 0.0–30.0)
Microalbumin, Urine: 33.1 ug/mL

## 2017-05-02 ENCOUNTER — Other Ambulatory Visit: Payer: Self-pay | Admitting: Physician Assistant

## 2017-05-15 ENCOUNTER — Encounter: Payer: Self-pay | Admitting: Physician Assistant

## 2017-05-16 MED ORDER — CLINDAMYCIN HCL 300 MG PO CAPS: 300.0000 mg | ORAL_CAPSULE | Freq: Three times a day (TID) | ORAL | 1 refills | Status: DC

## 2017-05-29 ENCOUNTER — Encounter: Payer: Self-pay | Admitting: Physician Assistant

## 2017-06-02 MED ORDER — DULOXETINE HCL 30 MG PO CPEP
90.0000 mg | ORAL_CAPSULE | Freq: Every day | ORAL | 5 refills | Status: DC
Start: 2017-06-02 — End: 2018-02-09

## 2017-07-28 ENCOUNTER — Other Ambulatory Visit: Payer: Self-pay | Admitting: Physician Assistant

## 2017-07-29 ENCOUNTER — Other Ambulatory Visit: Payer: Self-pay | Admitting: Physician Assistant

## 2017-08-04 ENCOUNTER — Ambulatory Visit: Payer: Commercial Managed Care - PPO | Admitting: Physician Assistant

## 2017-08-05 ENCOUNTER — Ambulatory Visit: Payer: Commercial Managed Care - PPO | Admitting: Physician Assistant

## 2017-08-05 NOTE — Pre-Procedure Instructions (Addendum)
John Ware  08/05/2017      [facility] [place] - [address] [place] [address] Phone: [phone] Fax: [phone]    Your procedure is scheduled on Monday, August 11, 2017  Report [facility] Ad[facility] "A" at 5:30AM   Call this number if you have problems the morning of surgery:  [phone]   Remember:  Do not eat food or drink liquids after midnight.  Take these medicines the morning of surgery with A SIP OF WATER: AmLODipine (NORVASC),  BuPROPion (WELLBUTRIN XL), Gabapentin (NEURONTIN), and  TiZANidine (ZANAFLEX). If needed OxyCODONE-acetaminophen (PERCOCET) for pain.  As of today, stop taking all Aspirins, Vitamins, Fish oils, and Herbal medications. Also stop all NSAIDS i.e. Advil, Ibuprofen, Motrin, Aleve, Anaprox, Naproxen, BC and Goody Powders.  How to Manage Your Diabetes Before and After Surgery  Why is it important to control my blood sugar before and after surgery? . Improving blood sugar levels before and after surgery helps healing and can limit problems. . A way of improving blood sugar control is eating a healthy diet by: o  Eating less sugar and carbohydrates o  Increasing activity/exercise o  Talking with your doctor about reaching your blood sugar goals . High blood sugars (greater than 180 mg/dL) can raise your risk of infections and slow your recovery, so you will need to focus on controlling your diabetes during the weeks before surgery. . Make sure that the doctor who takes care of your diabetes knows about your planned surgery including the date and location.  How do I manage my blood sugar before surgery? . Check your blood sugar at least 4 times a day, starting 2 days before surgery, to make sure that the level is not too high or low. o Check your blood sugar the morning of your surgery when you wake up and every 2 hours until you get to the Short Stay unit. . If your  blood sugar is less than 70 mg/dL, you will need to treat for low blood sugar: o Do not take insulin. o Treat a low blood sugar (less than 70 mg/dL) with  cup of clear juice (cranberry or apple), 4 glucose tablets, OR glucose gel. Recheck blood sugar in 15 minutes after treatment (to make sure it is greater than 70 mg/dL). If your blood sugar is not greater than 70 mg/dL on recheck, call [phone] o  for further instructions. . Report your blood sugar to the short stay nurse when you get to Short Stay.  . If you are admitted to the hospital after surgery: o Your blood sugar will be checked by the staff and you will probably be given insulin after surgery (instead of oral diabetes medicines) to make sure you have good blood sugar levels. o The goal for blood sugar control after surgery is 80-180 mg/dL.  WHAT DO I DO ABOUT MY DIABETES MEDICATION?  Marland Kitchen. Do not take MetFORMIN (GLUCOPHAGE) the morning of surgery.  . If your CBG is greater than 220 mg/dL, call us at [phone]   Do not wear jewelry.  Do not wear lotions, powders, colognes, or deodorant.  Do not shave 48 hours prior to surgery.  Men may shave face and neck.  Do not bring valuables to the hospital.  [facility] is not responsible for any belongings or valuables.  Contacts, dentures or bridgework may not be worn into surgery.  Leave your suitcase in the car.  After surgery it may be brought to your room.  For patients admitted to the hospital, discharge time will be determined by your treatment team.  Patients discharged the day of surgery will not be allowed to drive home.   Special instructions:   [place]- Preparing For Surgery  Before surgery, you can play an important role. Because skin is not sterile, your skin needs to be as free of germs as possible. You can reduce the number of germs on your skin by washing with CHG (chlorahexidine gluconate) Soap before surgery.  CHG is an antiseptic cleaner which kills germs  and bonds with the skin to continue killing germs even after washing.  Please do not use if you have an allergy to CHG or antibacterial soaps. If your skin becomes reddened/irritated stop using the CHG.  Do not shave (including legs and underarms) for at least 48 hours prior to first CHG shower. It is OK to shave your face.  Please follow these instructions carefully.   1. Shower the NIGHT BEFORE SURGERY and the MORNING OF SURGERY with CHG.   2. If you chose to wash your hair, wash your hair first as usual with your normal shampoo.  3. After you shampoo, rinse your hair and body thoroughly to remove the shampoo.  4. Use CHG as you would any other liquid soap. You can apply CHG directly to the skin and wash gently with a scrungie or a clean washcloth.   5. Apply the CHG Soap to your body ONLY FROM THE NECK DOWN.  Do not use on open wounds or open sores. Avoid contact with your eyes, ears, mouth and genitals (private parts). Wash Face and genitals (private parts)  with your normal soap.  6. Wash thoroughly, paying special attention to the area where your surgery will be performed.  7. Thoroughly rinse your body with warm water from the neck down.  8. DO NOT shower/wash with your normal soap after using and rinsing off the CHG Soap.  9. Pat yourself dry with a CLEAN TOWEL.  10. Wear CLEAN PAJAMAS to bed the night before surgery, wear comfortable clothes the morning of surgery  11. Place CLEAN SHEETS on your bed the night of your first shower and DO NOT SLEEP WITH PETS.  Day of Surgery: Do not apply any deodorants/lotions. Please wear clean clothes to the hospital/surgery center.    Please read over the following fact sheets that you were given. Pain Booklet, Coughing and Deep Breathing, Blood Transfusion Information, MRSA Information and Surgical Site Infection Prevention

## 2017-08-06 ENCOUNTER — Other Ambulatory Visit: Payer: Self-pay

## 2017-08-06 ENCOUNTER — Encounter (HOSPITAL_COMMUNITY): Payer: Self-pay

## 2017-08-06 ENCOUNTER — Encounter (HOSPITAL_COMMUNITY)
Admission: RE | Admit: 2017-08-06 | Discharge: 2017-08-06 | Disposition: A | Discharge status: Home / Self Care | Payer: Commercial Managed Care - PPO | Source: Ambulatory Visit | Attending: Neurological Surgery | Admitting: Neurological Surgery

## 2017-08-06 DIAGNOSIS — Z79899 Other long term (current) drug therapy: Secondary | ICD-10-CM | POA: Insufficient documentation

## 2017-08-06 DIAGNOSIS — Z7984 Long term (current) use of oral hypoglycemic drugs: Secondary | ICD-10-CM | POA: Diagnosis not present

## 2017-08-06 DIAGNOSIS — Z01810 Encounter for preprocedural cardiovascular examination: Secondary | ICD-10-CM | POA: Diagnosis present

## 2017-08-06 DIAGNOSIS — M48061 Spinal stenosis, lumbar region without neurogenic claudication: Secondary | ICD-10-CM | POA: Insufficient documentation

## 2017-08-06 DIAGNOSIS — M5136 Other intervertebral disc degeneration, lumbar region: Secondary | ICD-10-CM | POA: Insufficient documentation

## 2017-08-06 DIAGNOSIS — E785 Hyperlipidemia, unspecified: Secondary | ICD-10-CM | POA: Diagnosis not present

## 2017-08-06 DIAGNOSIS — F329 Major depressive disorder, single episode, unspecified: Secondary | ICD-10-CM | POA: Insufficient documentation

## 2017-08-06 DIAGNOSIS — R9431 Abnormal electrocardiogram [ECG] [EKG]: Secondary | ICD-10-CM | POA: Diagnosis not present

## 2017-08-06 LAB — BASIC METABOLIC PANEL
Anion gap: 10 (ref 5–15)
BUN: 7 mg/dL (ref 6–20)
CO2: 27 mmol/L (ref 22–32)
Calcium: 9.6 mg/dL (ref 8.9–10.3)
Chloride: 99 mmol/L — ABNORMAL LOW (ref 101–111)
Creatinine, Ser: 0.87 mg/dL (ref 0.61–1.24)
GFR calc Af Amer: >60 mL/min (ref >60)
GFR calc non Af Amer: >60 mL/min (ref >60)
Glucose, Bld: 138 mg/dL — ABNORMAL HIGH (ref 65–99)
Potassium: 4.8 mmol/L (ref 3.5–5.1)
Sodium: 136 mmol/L (ref 135–145)

## 2017-08-06 LAB — CBC
HCT: 43.1 % (ref 39.0–52.0)
Hemoglobin: 15 g/dL (ref 13.0–17.0)
MCH: 31.8 pg (ref 26.0–34.0)
MCHC: 34.8 g/dL (ref 30.0–36.0)
MCV: 91.5 fL (ref 78.0–100.0)
Platelets: 306 10*3/uL (ref 150–400)
RBC: 4.71 MIL/uL (ref 4.22–5.81)
RDW: 12.3 % (ref 11.5–15.5)
WBC: 9.7 10*3/uL (ref 4.0–10.5)

## 2017-08-06 LAB — SURGICAL PCR SCREEN
MRSA, PCR: NEGATIVE
Staphylococcus aureus: NEGATIVE

## 2017-08-06 LAB — HEMOGLOBIN A1C
Hgb A1c MFr Bld: 7.4 % — ABNORMAL HIGH (ref 4.8–5.6)
Mean Plasma Glucose: 165.68 mg/dL

## 2017-08-06 LAB — TYPE AND SCREEN
ABO/RH(D): B POS
Antibody Screen: NEGATIVE

## 2017-08-06 LAB — ABO/RH: ABO/RH(D): B POS

## 2017-08-06 LAB — GLUCOSE, CAPILLARY: Glucose-Capillary: 147 mg/dL — ABNORMAL HIGH (ref 65–99)

## 2017-08-06 NOTE — Progress Notes (Signed)
PCP - Dr. Prudy FeelerAngel Ware  Cardiologist - Denies  Chest x-ray - Denies  EKG - 08/06/17  Stress Test - Denies  ECHO - Denies  Cardiac Cath - Denies  Sleep Study - No CPAP - None  LABS- 08/06/17: CBC, BMP, HA1C, T/S  HA1C- 08/06/17 Fasting Blood Sugar - 130, Today 147 Checks Blood Sugar __1___ times a day  Anesthesia-No  Pt denies having chest pain, sob, or fever at this time. All instructions explained to the pt, with a verbal understanding of the material. Pt agrees to go over the instructions while at home for a better understanding. The opportunity to ask questions was provided.

## 2017-08-08 ENCOUNTER — Encounter: Payer: Self-pay | Admitting: Physician Assistant

## 2017-08-08 ENCOUNTER — Ambulatory Visit (INDEPENDENT_AMBULATORY_CARE_PROVIDER_SITE_OTHER): Payer: Commercial Managed Care - PPO | Admitting: Physician Assistant

## 2017-08-08 DIAGNOSIS — M5136 Other intervertebral disc degeneration, lumbar region: Secondary | ICD-10-CM

## 2017-08-08 DIAGNOSIS — M48061 Spinal stenosis, lumbar region without neurogenic claudication: Secondary | ICD-10-CM | POA: Diagnosis not present

## 2017-08-08 MED ORDER — AMLODIPINE BESYLATE 5 MG PO TABS: 5.0000 mg | ORAL_TABLET | Freq: Every day | ORAL | 3 refills | Status: DC

## 2017-08-08 MED ORDER — OXYCODONE-ACETAMINOPHEN 10-325 MG PO TABS: 1.0000 | ORAL_TABLET | Freq: Four times a day (QID) | ORAL | 0 refills | Status: DC | PRN

## 2017-08-08 MED ORDER — GABAPENTIN 800 MG PO TABS: ORAL_TABLET | ORAL | 11 refills | Status: DC

## 2017-08-08 NOTE — Patient Instructions (Signed)
In a few days you may receive a survey in the mail or online from [employer] regarding your visit with us today. Please take a moment to fill this out. Your feedback is very important to our whole office. It can help us better understand your needs as well as improve your experience and satisfaction. Thank you for taking your time to complete it. We care about you.  Johnston Maddocks, PA-C  

## 2017-08-08 NOTE — Progress Notes (Signed)
BP 129/86   Pulse (!) 101   Temp 98.6 F (37 C) (Oral)   Ht 5' 10.5" (1.791 m)   Wt 219 lb 3.2 oz (99.4 kg)   BMI 31.01 kg/m    Subjective:    Patient ID: John Ware, male    DOB: December 16, 1965, 51 y.o.   MRN: [id number]  HPI: John Ware is a 51 y.o. male presenting on 08/08/2017 for Follow-up ( 3 month- having surgery on Monday )  This patient comes in for periodic recheck on medications and conditions including DDD that is worsening and will have surgery next week.  Danielle Dess is doing it.  Medications will be filled and we will be here if needed in coming weeks.   All medications are reviewed today. There are no reports of any problems with the medications. All of the medical conditions are reviewed and updated.  Lab work is reviewed and will be ordered as medically necessary. There are no new problems reported with today's visit.   Relevant past medical, surgical, family and social history reviewed and updated as indicated. Allergies and medications reviewed and updated.  Past Medical History:  Diagnosis Date  . Depression   . Diabetes mellitus without complication (HCC)   . Hypertension   . Spinal stenosis    With Neurogenic Claudication    Past Surgical History:  Procedure Laterality Date  . LUMBAR LAMINECTOMY/DECOMPRESSION MICRODISCECTOMY N/A 12/13/2014   Procedure: LUMBAR THREE-FOUR, LUMBAR FOUR-FIVE, LUMBAR FIVE-SACRAL ONE LAMINECTOMY;  Surgeon: [name], MD;  Location: [facility];  Service: Neurosurgery;  Laterality: N/A;  L3-4 L4-5 L5-S1 Laminectomy  . MENISCUS REPAIR     Right knee  . 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 VASECTOMY      Review of Systems  Constitutional: Negative.  Negative for appetite change and fatigue.  HENT: Negative.   Eyes: Negative.  Negative for pain and visual disturbance.  Respiratory: Negative.  Negative for cough, chest tightness, shortness of breath and wheezing.   Cardiovascular: Negative.   Negative for chest pain, palpitations and leg swelling.  Gastrointestinal: Negative.  Negative for abdominal pain, diarrhea, nausea and vomiting.  Endocrine: Negative.   Genitourinary: Negative.   Musculoskeletal: Positive for arthralgias, back pain and gait problem.  Skin: Negative.  Negative for color change and rash.  Neurological: Negative for weakness, numbness and headaches.  Psychiatric/Behavioral: Negative.     Allergies as of 08/08/2017   No Known Allergies     Medication List        Accurate as of 08/08/17  3:47 PM. Always use your most recent med list.          amLODipine 5 MG tablet Commonly known as:  NORVASC Take 1 tablet (5 mg total) by mouth daily.   baclofen 10 MG tablet Commonly known as:  LIORESAL Take 1 tablet (10 mg total) by mouth 2 (two) times daily.   buPROPion 150 MG 24 hr tablet Commonly known as:  WELLBUTRIN XL TAKE  (1)  TABLET TWICE A DAY.   DULoxetine 30 MG capsule Commonly known as:  CYMBALTA Take 3 capsules (90 mg total) by mouth daily.   gabapentin 800 MG tablet Commonly known as:  NEURONTIN TAKE  (1)  TABLET  THREE TIMES DAILY.   metFORMIN 500 MG tablet Commonly known as:  GLUCOPHAGE TAKE  (1)  TABLET  THREE TIMES DAILY WITH MEALS.   oxyCODONE-acetaminophen 10-325 MG tablet Commonly known as:  PERCOCET Take  1-2 tablets by mouth every 6 (six) hours as needed for pain.   oxyCODONE-acetaminophen 10-325 MG tablet Commonly known as:  PERCOCET Take 1-2 tablets by mouth every 6 (six) hours as needed for pain.   oxyCODONE-acetaminophen 10-325 MG tablet Commonly known as:  PERCOCET Take 1-2 tablets by mouth every 6 (six) hours as needed for pain.   ramipril 10 MG capsule Commonly known as:  ALTACE Take 1 capsule (10 mg total) by mouth daily.   tiZANidine 4 MG tablet Commonly known as:  ZANAFLEX Take 1 capsule (4 mg total) by mouth 3 (three) times daily.          Objective:    BP 129/86   Pulse (!) 101   Temp 98.6 F  (37 C) (Oral)   Ht 5' 10.5" (1.791 m)   Wt 219 lb 3.2 oz (99.4 kg)   BMI 31.01 kg/m   No Known Allergies  Physical Exam  Constitutional: He appears well-developed and well-nourished. No distress.  HENT:  Head: Normocephalic and atraumatic.  Eyes: Conjunctivae and EOM are normal. Pupils are equal, round, and reactive to light.  Cardiovascular: Normal rate, regular rhythm and normal heart sounds.  Pulmonary/Chest: Effort normal and breath sounds normal. No respiratory distress.  Skin: Skin is warm and dry.  Psychiatric: He has a normal mood and affect. His behavior is normal.  Nursing note and vitals reviewed.   Results for orders placed or performed during the hospital encounter of 08/06/17  Surgical pcr screen  Result Value Ref Range   MRSA, PCR NEGATIVE NEGATIVE   Staphylococcus aureus NEGATIVE NEGATIVE  Glucose, capillary  Result Value Ref Range   Glucose-Capillary 147 (H) 65 - 99 mg/dL  Hemoglobin [id number]  Result Value Ref Range   Hgb A1c MFr Bld 7.4 (H) 4.8 - 5.6 %   Mean Plasma Glucose 165.68 mg/dL  Basic metabolic panel  Result Value Ref Range   Sodium 136 135 - 145 mmol/L   Potassium 4.8 3.5 - 5.1 mmol/L   Chloride 99 (L) 101 - 111 mmol/L   CO2 27 22 - 32 mmol/L   Glucose, Bld 138 (H) 65 - 99 mg/dL   BUN 7 6 - 20 mg/dL   Creatinine, Ser 0.960.87 0.61 - 1.24 mg/dL   Calcium 9.6 8.9 - 04.510.3 mg/dL   GFR calc non Af Amer >60 >60 mL/min   GFR calc Af Amer >60 >60 mL/min   Anion gap 10 5 - 15  CBC  Result Value Ref Range   WBC 9.7 4.0 - 10.5 K/uL   RBC 4.71 4.22 - 5.81 MIL/uL   Hemoglobin 15.0 13.0 - 17.0 g/dL   HCT 40.943.1 81.139.0 - 91.452.0 %   MCV 91.5 78.0 - 100.0 fL   MCH 31.8 26.0 - 34.0 pg   MCHC 34.8 30.0 - 36.0 g/dL   RDW 78.212.3 95.611.5 - 21.315.5 %   Platelets 306 150 - 400 K/uL  Type and screen  Result Value Ref Range   ABO/RH(D) B POS    Antibody Screen NEG    Sample Expiration 08/20/2017    Extend sample reason NO TRANSFUSIONS OR PREGNANCY IN THE PAST 3 MONTHS   ABO/Rh   Result Value Ref Range   ABO/RH(D) B POS       Assessment & Plan:   1. DDD (degenerative disc disease), lumbar - oxyCODONE-acetaminophen (PERCOCET) 10-325 MG tablet; Take 1-2 tablets by mouth every 6 (six) hours as needed for pain.  Dispense: 240 tablet; Refill: 0 - oxyCODONE-acetaminophen (  PERCOCET) 10-325 MG tablet; Take 1-2 tablets by mouth every 6 (six) hours as needed for pain.  Dispense: 240 tablet; Refill: 0 - oxyCODONE-acetaminophen (PERCOCET) 10-325 MG tablet; Take 1-2 tablets by mouth every 6 (six) hours as needed for pain.  Dispense: 240 tablet; Refill: 0  2. Spinal stenosis of lumbar region, unspecified whether neurogenic claudication present - oxyCODONE-acetaminophen (PERCOCET) 10-325 MG tablet; Take 1-2 tablets by mouth every 6 (six) hours as needed for pain.  Dispense: 240 tablet; Refill: 0 - oxyCODONE-acetaminophen (PERCOCET) 10-325 MG tablet; Take 1-2 tablets by mouth every 6 (six) hours as needed for pain.  Dispense: 240 tablet; Refill: 0 - oxyCODONE-acetaminophen (PERCOCET) 10-325 MG tablet; Take 1-2 tablets by mouth every 6 (six) hours as needed for pain.  Dispense: 240 tablet; Refill: 0 - gabapentin (NEURONTIN) 800 MG tablet; TAKE  (1)  TABLET  THREE TIMES DAILY.  Dispense: 90 tablet; Refill: 11    Current Outpatient Medications:  .  amLODipine (NORVASC) 5 MG tablet, Take 1 tablet (5 mg total) by mouth daily., Disp: 90 tablet, Rfl: 3 .  baclofen (LIORESAL) 10 MG tablet, Take 1 tablet (10 mg total) by mouth 2 (two) times daily. (Patient taking differently: Take 10 mg by mouth 2 (two) times daily as needed for muscle spasms. ), Disp: 60 each, Rfl: 2 .  buPROPion (WELLBUTRIN XL) 150 MG 24 hr tablet, TAKE  (1)  TABLET TWICE A DAY., Disp: 60 tablet, Rfl: 11 .  DULoxetine (CYMBALTA) 30 MG capsule, Take 3 capsules (90 mg total) by mouth daily., Disp: 90 capsule, Rfl: 5 .  gabapentin (NEURONTIN) 800 MG tablet, TAKE  (1)  TABLET  THREE TIMES DAILY., Disp: 90 tablet, Rfl: 11 .   metFORMIN (GLUCOPHAGE) 500 MG tablet, TAKE  (1)  TABLET  THREE TIMES DAILY WITH MEALS., Disp: 90 tablet, Rfl: 3 .  oxyCODONE-acetaminophen (PERCOCET) 10-325 MG tablet, Take 1-2 tablets by mouth every 6 (six) hours as needed for pain., Disp: 240 tablet, Rfl: 0 .  oxyCODONE-acetaminophen (PERCOCET) 10-325 MG tablet, Take 1-2 tablets by mouth every 6 (six) hours as needed for pain., Disp: 240 tablet, Rfl: 0 .  oxyCODONE-acetaminophen (PERCOCET) 10-325 MG tablet, Take 1-2 tablets by mouth every 6 (six) hours as needed for pain., Disp: 240 tablet, Rfl: 0 .  ramipril (ALTACE) 10 MG capsule, Take 1 capsule (10 mg total) by mouth daily., Disp: 30 capsule, Rfl: 11 .  tiZANidine (ZANAFLEX) 4 MG tablet, Take 1 capsule (4 mg total) by mouth 3 (three) times daily., Disp: 60 tablet, Rfl: 5 Continue all other maintenance medications as listed above.  Follow up plan: Recheck 3 months  Educational handout given for survey  Remus LofflerAngel S. Robynne Roat PA-C [facility] [address]  [place], [place] [address] [phone]   08/08/2017, 3:47 PM

## 2017-08-10 ENCOUNTER — Encounter (HOSPITAL_COMMUNITY): Payer: Self-pay | Admitting: Certified Registered Nurse Anesthetist

## 2017-08-10 NOTE — Anesthesia Preprocedure Evaluation (Deleted)
Anesthesia Evaluation    Airway        Dental   Pulmonary former smoker,           Cardiovascular hypertension, Pt. on medications      Neuro/Psych negative neurological ROS     GI/Hepatic negative GI ROS, Neg liver ROS,   Endo/Other  diabetes, Type 2, Oral Hypoglycemic Agents  Renal/GU negative Renal ROS     Musculoskeletal  (+) Arthritis ,   Abdominal   Peds  Hematology negative hematology ROS (+)   Anesthesia Other Findings   Reproductive/Obstetrics                             Lab Results  Component Value Date   WBC 9.7 08/06/2017   HGB 15.0 08/06/2017   HCT 43.1 08/06/2017   MCV 91.5 08/06/2017   PLT 306 08/06/2017   Lab Results  Component Value Date   CREATININE 0.87 08/06/2017   BUN 7 08/06/2017   NA 136 08/06/2017   K 4.8 08/06/2017   CL 99 (L) 08/06/2017   CO2 27 08/06/2017    Anesthesia Physical Anesthesia Plan  ASA: II  Anesthesia Plan: General   Post-op Pain Management:    Induction: Intravenous  PONV Risk Score and Plan:   Airway Management Planned: Oral ETT  Additional Equipment:   Intra-op Plan:   Post-operative Plan: Extubation in OR  Informed Consent:   Plan Discussed with:   Anesthesia Plan Comments:         Anesthesia Quick Evaluation

## 2017-08-11 ENCOUNTER — Ambulatory Visit (HOSPITAL_COMMUNITY)
Admission: RE | Admit: 2017-08-11 | Discharge: 2017-08-11 | Disposition: A | Discharge status: Home / Self Care | Payer: Commercial Managed Care - PPO | Source: Ambulatory Visit | Attending: Neurological Surgery | Admitting: Neurological Surgery

## 2017-08-11 ENCOUNTER — Encounter (HOSPITAL_COMMUNITY)
Admission: RE | Disposition: A | Discharge status: Home / Self Care | Payer: Self-pay | Source: Ambulatory Visit | Attending: Neurological Surgery

## 2017-08-11 ENCOUNTER — Encounter (HOSPITAL_COMMUNITY): Payer: Self-pay | Admitting: Certified Registered Nurse Anesthetist

## 2017-08-11 DIAGNOSIS — R509 Fever, unspecified: Secondary | ICD-10-CM | POA: Insufficient documentation

## 2017-08-11 DIAGNOSIS — K0889 Other specified disorders of teeth and supporting structures: Secondary | ICD-10-CM | POA: Diagnosis not present

## 2017-08-11 DIAGNOSIS — Z01812 Encounter for preprocedural laboratory examination: Secondary | ICD-10-CM | POA: Diagnosis not present

## 2017-08-11 DIAGNOSIS — M48062 Spinal stenosis, lumbar region with neurogenic claudication: Secondary | ICD-10-CM | POA: Diagnosis not present

## 2017-08-11 LAB — CBC
HCT: 42.8 % (ref 39.0–52.0)
Hemoglobin: 15 g/dL (ref 13.0–17.0)
MCH: 31.9 pg (ref 26.0–34.0)
MCHC: 35 g/dL (ref 30.0–36.0)
MCV: 91.1 fL (ref 78.0–100.0)
Platelets: 283 10*3/uL (ref 150–400)
RBC: 4.7 MIL/uL (ref 4.22–5.81)
RDW: 12.3 % (ref 11.5–15.5)
WBC: 11.7 10*3/uL — ABNORMAL HIGH (ref 4.0–10.5)

## 2017-08-11 SURGERY — POSTERIOR LUMBAR FUSION 3 LEVEL
Anesthesia: General | Site: Back

## 2017-08-11 MED ORDER — CHLORHEXIDINE GLUCONATE CLOTH 2 % EX PADS: 6.0000 | MEDICATED_PAD | Freq: Once | CUTANEOUS | Status: DC

## 2017-08-11 MED ORDER — SUCCINYLCHOLINE CHLORIDE 200 MG/10ML IV SOSY
PREFILLED_SYRINGE | INTRAVENOUS | Status: AC
  Filled 2017-08-11: qty 10

## 2017-08-11 MED ORDER — FENTANYL CITRATE (PF) 250 MCG/5ML IJ SOLN
INTRAMUSCULAR | Status: AC
  Filled 2017-08-11: qty 5

## 2017-08-11 MED ORDER — PHENYLEPHRINE 40 MCG/ML (10ML) SYRINGE FOR IV PUSH (FOR BLOOD PRESSURE SUPPORT)
PREFILLED_SYRINGE | INTRAVENOUS | Status: AC
  Filled 2017-08-11: qty 10

## 2017-08-11 MED ORDER — LIDOCAINE 2% (20 MG/ML) 5 ML SYRINGE
INTRAMUSCULAR | Status: AC
  Filled 2017-08-11: qty 5

## 2017-08-11 MED ORDER — DEXAMETHASONE SODIUM PHOSPHATE 10 MG/ML IJ SOLN
INTRAMUSCULAR | Status: AC
  Filled 2017-08-11: qty 1

## 2017-08-11 MED ORDER — PROPOFOL 10 MG/ML IV BOLUS
INTRAVENOUS | Status: AC
  Filled 2017-08-11: qty 20

## 2017-08-11 MED ORDER — MIDAZOLAM HCL 2 MG/2ML IJ SOLN
INTRAMUSCULAR | Status: AC
  Filled 2017-08-11: qty 2

## 2017-08-11 MED ORDER — THROMBIN (RECOMBINANT) 5000 UNITS EX SOLR
CUTANEOUS | Status: AC
  Filled 2017-08-11: qty 5000

## 2017-08-11 MED ORDER — BUPIVACAINE HCL (PF) 0.5 % IJ SOLN
INTRAMUSCULAR | Status: AC
  Filled 2017-08-11: qty 30

## 2017-08-11 MED ORDER — EPHEDRINE 5 MG/ML INJ
INTRAVENOUS | Status: AC
  Filled 2017-08-11: qty 10

## 2017-08-11 MED ORDER — ROCURONIUM BROMIDE 10 MG/ML (PF) SYRINGE
PREFILLED_SYRINGE | INTRAVENOUS | Status: AC
  Filled 2017-08-11: qty 5

## 2017-08-11 MED ORDER — CEFAZOLIN SODIUM-DEXTROSE 2-4 GM/100ML-% IV SOLN: 2.0000 g | INTRAVENOUS | Status: DC

## 2017-08-11 MED ORDER — SUGAMMADEX SODIUM 200 MG/2ML IV SOLN
INTRAVENOUS | Status: AC
  Filled 2017-08-11: qty 2

## 2017-08-11 MED ORDER — ONDANSETRON HCL 4 MG/2ML IJ SOLN
INTRAMUSCULAR | Status: AC
  Filled 2017-08-11: qty 2

## 2017-08-11 MED ORDER — CEFAZOLIN SODIUM-DEXTROSE 2-4 GM/100ML-% IV SOLN
INTRAVENOUS | Status: AC
  Filled 2017-08-11: qty 100

## 2017-08-11 MED ORDER — LIDOCAINE-EPINEPHRINE 1 %-1:100000 IJ SOLN
INTRAMUSCULAR | Status: AC
  Filled 2017-08-11: qty 1

## 2017-08-11 MED ORDER — THROMBIN (RECOMBINANT) 20000 UNITS EX SOLR
CUTANEOUS | Status: AC
  Filled 2017-08-11: qty 20000

## 2017-08-11 NOTE — Progress Notes (Signed)
Patient states he may have abscessed tooth. Reports that a filling fell out of the lower left tooth yesterday and now has pain in that area. No drainage reported. No fever overnight. Will report this to anesthesia and surgeon.

## 2017-08-11 NOTE — Progress Notes (Signed)
Surgery cancelled per Dr. Danielle DessElsner due to patient complaint of tooth pain, possible abscess, elevated temp and elevated WBC. IV d/c'd catheter intact. Pt dressed and escorted to main entrance.

## 2017-08-11 NOTE — H&P (Signed)
[name] is to be admittedto undergo surgical decompression from L3 to the sacrum.On presentation this morning he notes studies had severe tooth pain after he lost a filling several days ago. He has a temperature of 99.4.He rates the pain level of 7 out of 10. His white c Prior to surgery today a repeat CBC was performed. His white count is now 11.7. In light of the fact that he may have infected dental carry I suggested that we should have him undergo his dental procedures and once resolved we can proceed with a three-level decompression and fusion. His surgery is therefore canceled.

## 2017-09-02 ENCOUNTER — Other Ambulatory Visit: Payer: Self-pay | Admitting: Neurological Surgery

## 2017-09-23 NOTE — Pre-Procedure Instructions (Signed)
John Ware  09/23/2017      [facility] [facility] - [place] - [address] [place] [address] Phone: [phone] Fax: [phone]    Your procedure is scheduled on 09/29/2017.  Report to [facility] Admitting at 0530 A.M.  Call this number if you have problems the morning of surgery:  [phone]   Remember:   Continue all medications as directed by your physician except follow these medication instructions before surgery below   Do not eat food or drink liquids after midnight.   Take these medicines the morning of surgery with A SIP OF WATER: Amlodipine (Norvasc) Baclofen (Lioresal) - if needed Bupropion (Wellbutrin XL) Duloxetine (Cymbalta) Gabapentin (Neurontin) Guaifenesin (Mucinex) Oxycodone-acetaminophen (Percocet) - if needed  7 days prior to surgery STOP taking any Aspirin(unless otherwise instructed by your surgeon), Aleve, Naproxen, Ibuprofen, Motrin, Advil, Goody's, BC's, all herbal medications, fish oil, and all vitamins   WHAT DO I DO ABOUT MY DIABETES MEDICATION?  Marland Kitchen Do not take oral diabetes medicines (pills) the morning of surgery. - DO NOT TAKE YOUR METFORMIN THE MORNING OF SURGERY.   How to Manage Your Diabetes Before and After Surgery  Why is it important to control my blood sugar before and after surgery? . Improving blood sugar levels before and after surgery helps healing and can limit problems. . A way of improving blood sugar control is eating a healthy diet by: o  Eating less sugar and carbohydrates o  Increasing activity/exercise o  Talking with your doctor about reaching your blood sugar goals . High blood sugars (greater than 180 mg/dL) can raise your risk of infections and slow your recovery, so you will need to focus on controlling your diabetes during the weeks before surgery. . Make sure that the doctor who takes care of your diabetes knows about your planned surgery including the date  and location.  How do I manage my blood sugar before surgery? . Check your blood sugar at least 4 times a day, starting 2 days before surgery, to make sure that the level is not too high or low. o Check your blood sugar the morning of your surgery when you wake up and every 2 hours until you get to the Short Stay unit. . If your blood sugar is less than 70 mg/dL, you will need to treat for low blood sugar: o Do not take insulin. o Treat a low blood sugar (less than 70 mg/dL) with  cup of clear juice (cranberry or apple), 4 glucose tablets, OR glucose gel. Recheck blood sugar in 15 minutes after treatment (to make sure it is greater than 70 mg/dL). If your blood sugar is not greater than 70 mg/dL on recheck, call [phone] o  for further instructions. . Report your blood sugar to the short stay nurse when you get to Short Stay.  . If you are admitted to the hospital after surgery: o Your blood sugar will be checked by the staff and you will probably be given insulin after surgery (instead of oral diabetes medicines) to make sure you have good blood sugar levels. o The goal for blood sugar control after surgery is 80-180 mg/dL.     Do not wear jewelry, make-up or nail polish.  Do not wear lotions, powders, or colognes, or deodorant.  Men may shave face and neck.  Do not bring valuables to the hospital.  [facility] is not responsible for any belongings or valuables.  Hearing aids, eyeglasses, contacts, dentures or bridgework may not be worn into surgery.  Leave your suitcase in the car.  After surgery it may be brought to your room.  For patients admitted to the hospital, discharge time will be determined by your treatment team.  Patients discharged the day of surgery will not be allowed to drive home.   Name and phone number of your driver:    Special instructions:   [place]- Preparing For Surgery  Before surgery, you can play an important role. Because skin is not sterile,  your skin needs to be as free of germs as possible. You can reduce the number of germs on your skin by washing with CHG (chlorahexidine gluconate) Soap before surgery.  CHG is an antiseptic cleaner which kills germs and bonds with the skin to continue killing germs even after washing.  Please do not use if you have an allergy to CHG or antibacterial soaps. If your skin becomes reddened/irritated stop using the CHG.  Do not shave (including legs and underarms) for at least 48 hours prior to first CHG shower. It is OK to shave your face.  Please follow these instructions carefully.   1. Shower the NIGHT BEFORE SURGERY and the MORNING OF SURGERY with CHG.   2. If you chose to wash your hair, wash your hair first as usual with your normal shampoo.  3. After you shampoo, rinse your hair and body thoroughly to remove the shampoo.  4. Use CHG as you would any other liquid soap. You can apply CHG directly to the skin and wash gently with a scrungie or a clean washcloth.   5. Apply the CHG Soap to your body ONLY FROM THE NECK DOWN.  Do not use on open wounds or open sores. Avoid contact with your eyes, ears, mouth and genitals (private parts). Wash Face and genitals (private parts)  with your normal soap.  6. Wash thoroughly, paying special attention to the area where your surgery will be performed.  7. Thoroughly rinse your body with warm water from the neck down.  8. DO NOT shower/wash with your normal soap after using and rinsing off the CHG Soap.  9. Pat yourself dry with a CLEAN TOWEL.  10. Wear CLEAN PAJAMAS to bed the night before surgery, wear comfortable clothes the morning of surgery  11. Place CLEAN SHEETS on your bed the night of your first shower and DO NOT SLEEP WITH PETS.    Day of Surgery: Shower as stated above. Do not apply any deodorants/lotions. Please wear clean clothes to the hospital/surgery center.      Please read over the following fact sheets that you were  given.

## 2017-09-24 ENCOUNTER — Encounter (HOSPITAL_COMMUNITY)
Admission: RE | Admit: 2017-09-24 | Discharge: 2017-09-24 | Disposition: A | Discharge status: Home / Self Care | Payer: Commercial Managed Care - PPO | Source: Ambulatory Visit | Attending: Neurological Surgery | Admitting: Neurological Surgery

## 2017-09-24 ENCOUNTER — Encounter (HOSPITAL_COMMUNITY): Payer: Self-pay

## 2017-09-24 ENCOUNTER — Other Ambulatory Visit: Payer: Self-pay

## 2017-09-24 DIAGNOSIS — Z01812 Encounter for preprocedural laboratory examination: Secondary | ICD-10-CM | POA: Insufficient documentation

## 2017-09-24 DIAGNOSIS — M5137 Other intervertebral disc degeneration, lumbosacral region: Secondary | ICD-10-CM | POA: Diagnosis not present

## 2017-09-24 DIAGNOSIS — M5136 Other intervertebral disc degeneration, lumbar region: Secondary | ICD-10-CM | POA: Diagnosis not present

## 2017-09-24 DIAGNOSIS — E1029 Type 1 diabetes mellitus with other diabetic kidney complication: Secondary | ICD-10-CM | POA: Diagnosis not present

## 2017-09-24 DIAGNOSIS — M25561 Pain in right knee: Secondary | ICD-10-CM | POA: Diagnosis not present

## 2017-09-24 DIAGNOSIS — R296 Repeated falls: Secondary | ICD-10-CM | POA: Insufficient documentation

## 2017-09-24 DIAGNOSIS — E785 Hyperlipidemia, unspecified: Secondary | ICD-10-CM | POA: Insufficient documentation

## 2017-09-24 DIAGNOSIS — F339 Major depressive disorder, recurrent, unspecified: Secondary | ICD-10-CM | POA: Insufficient documentation

## 2017-09-24 DIAGNOSIS — E1069 Type 1 diabetes mellitus with other specified complication: Secondary | ICD-10-CM | POA: Diagnosis not present

## 2017-09-24 LAB — BASIC METABOLIC PANEL
Anion gap: 11 (ref 5–15)
BUN: 14 mg/dL (ref 6–20)
CO2: 22 mmol/L (ref 22–32)
Calcium: 9.1 mg/dL (ref 8.9–10.3)
Chloride: 102 mmol/L (ref 101–111)
Creatinine, Ser: 0.85 mg/dL (ref 0.61–1.24)
GFR calc Af Amer: >60 mL/min (ref >60)
GFR calc non Af Amer: >60 mL/min (ref >60)
Glucose, Bld: 143 mg/dL — ABNORMAL HIGH (ref 65–99)
Potassium: 4.2 mmol/L (ref 3.5–5.1)
Sodium: 135 mmol/L (ref 135–145)

## 2017-09-24 LAB — TYPE AND SCREEN
ABO/RH(D): B POS
Antibody Screen: NEGATIVE

## 2017-09-24 LAB — CBC
HCT: 43.3 % (ref 39.0–52.0)
Hemoglobin: 15.3 g/dL (ref 13.0–17.0)
MCH: 32.8 pg (ref 26.0–34.0)
MCHC: 35.3 g/dL (ref 30.0–36.0)
MCV: 92.7 fL (ref 78.0–100.0)
Platelets: 335 10*3/uL (ref 150–400)
RBC: 4.67 MIL/uL (ref 4.22–5.81)
RDW: 12.7 % (ref 11.5–15.5)
WBC: 8.2 10*3/uL (ref 4.0–10.5)

## 2017-09-24 LAB — GLUCOSE, CAPILLARY: Glucose-Capillary: 147 mg/dL — ABNORMAL HIGH (ref 65–99)

## 2017-09-24 LAB — SURGICAL PCR SCREEN
MRSA, PCR: NEGATIVE
Staphylococcus aureus: NEGATIVE

## 2017-09-24 NOTE — Progress Notes (Signed)
PCP - Dr. Prudy FeelerAngel Jones Cardiologist - patient denies  Chest x-ray - n/a EKG - 08/06/17 Stress Test - patient denies ECHO - patient denies Cardiac Cath - patient denies  Sleep Study - patient denies  Fasting Blood Sugar - 140-170s Checks Blood Sugar 1 time a day (lately more often because he is working on getting his blood sugars lower)  Anesthesia review: n/a  Patient denies shortness of breath, fever, cough and chest pain at PAT appointment   Patient verbalized understanding of instructions that were given to them at the PAT appointment. Patient was also instructed that they will need to review over the PAT instructions again at home before surgery.

## 2017-10-13 ENCOUNTER — Telehealth: Payer: Self-pay | Admitting: Physician Assistant

## 2017-10-13 ENCOUNTER — Other Ambulatory Visit: Payer: Self-pay

## 2017-10-13 ENCOUNTER — Encounter (HOSPITAL_COMMUNITY): Payer: Self-pay | Admitting: *Deleted

## 2017-10-13 NOTE — Progress Notes (Signed)
Spoke with pt for pre-op call. Pt had a PAT appt on 09/24/17 and he states medications, medical and surgical history has not changed since then. Pt states his fasting blood sugar has been in the 140's. Denies cardiac history, chest pain or sob. Instructed pt not to take his Metformin in the AM and to check his blood sugar when he gets up in the morning. If blood sugar is 70 or below, treat with 1/2 cup of clear juice (apple or cranberry) and recheck blood sugar 15 minutes after drinking juice.

## 2017-10-13 NOTE — Anesthesia Preprocedure Evaluation (Addendum)
Anesthesia Evaluation  Patient identified by MRN, date of birth, ID band Patient awake    Reviewed: Allergy & Precautions, H&P , NPO status , Patient's Chart, lab work & pertinent test results  Airway Mallampati: I  TM Distance: >3 FB Neck ROM: Full    Dental no notable dental hx. (+) Teeth Intact, Dental Advisory Given   Pulmonary neg pulmonary ROS, former smoker,    Pulmonary exam normal breath sounds clear to auscultation       Cardiovascular Exercise Tolerance: Good hypertension, Pt. on medications  Rhythm:Regular Rate:Normal     Neuro/Psych Depression negative neurological ROS  negative psych ROS   GI/Hepatic negative GI ROS, Neg liver ROS,   Endo/Other  negative endocrine ROSdiabetes, Type 2, Oral Hypoglycemic Agents  Renal/GU   negative genitourinary   Musculoskeletal  (+) Arthritis , Osteoarthritis,    Abdominal   Peds  Hematology negative hematology ROS (+)   Anesthesia Other Findings   Reproductive/Obstetrics negative OB ROS                            Anesthesia Physical Anesthesia Plan  ASA: II  Anesthesia Plan: General   Post-op Pain Management:    Induction: Intravenous  PONV Risk Score and Plan: 3 and Ondansetron, Dexamethasone and Midazolam  Airway Management Planned: Oral ETT  Additional Equipment: Arterial line  Intra-op Plan:   Post-operative Plan: Extubation in OR  Informed Consent: I have reviewed the patients History and Physical, chart, labs and discussed the procedure including the risks, benefits and alternatives for the proposed anesthesia with the patient or authorized representative who has indicated his/her understanding and acceptance.   Dental advisory given  Plan Discussed with: CRNA  Anesthesia Plan Comments:         Anesthesia Quick Evaluation

## 2017-10-13 NOTE — Telephone Encounter (Signed)
If it needs prior Berkley Harveyauth has not gotten from [facility] yet    Prior [place] Duraauths will be worked on Education officer, museumtomorrow  Will check on it

## 2017-10-14 ENCOUNTER — Inpatient Hospital Stay (HOSPITAL_COMMUNITY): Payer: Commercial Managed Care - PPO

## 2017-10-14 ENCOUNTER — Inpatient Hospital Stay (HOSPITAL_COMMUNITY)
Admission: RE | Admit: 2017-10-14 | Discharge: 2017-10-15 | DRG: 454 | Disposition: A | Discharge status: Home / Self Care | Payer: Commercial Managed Care - PPO | Source: Ambulatory Visit | Attending: Neurological Surgery | Admitting: Neurological Surgery

## 2017-10-14 ENCOUNTER — Inpatient Hospital Stay (HOSPITAL_COMMUNITY): Payer: Commercial Managed Care - PPO | Admitting: Anesthesiology

## 2017-10-14 ENCOUNTER — Encounter (HOSPITAL_COMMUNITY)
Admission: RE | Disposition: A | Discharge status: Home / Self Care | Payer: Self-pay | Source: Ambulatory Visit | Attending: Neurological Surgery

## 2017-10-14 ENCOUNTER — Other Ambulatory Visit: Payer: Self-pay | Admitting: Physician Assistant

## 2017-10-14 ENCOUNTER — Other Ambulatory Visit: Payer: Self-pay

## 2017-10-14 DIAGNOSIS — Z7984 Long term (current) use of oral hypoglycemic drugs: Secondary | ICD-10-CM

## 2017-10-14 DIAGNOSIS — M48061 Spinal stenosis, lumbar region without neurogenic claudication: Secondary | ICD-10-CM

## 2017-10-14 DIAGNOSIS — Z419 Encounter for procedure for purposes other than remedying health state, unspecified: Secondary | ICD-10-CM

## 2017-10-14 DIAGNOSIS — Y758 Miscellaneous neurological devices associated with adverse incidents, not elsewhere classified: Secondary | ICD-10-CM | POA: Diagnosis not present

## 2017-10-14 DIAGNOSIS — Y658 Other specified misadventures during surgical and medical care: Secondary | ICD-10-CM | POA: Diagnosis not present

## 2017-10-14 DIAGNOSIS — Z87891 Personal history of nicotine dependence: Secondary | ICD-10-CM | POA: Diagnosis not present

## 2017-10-14 DIAGNOSIS — I10 Essential (primary) hypertension: Secondary | ICD-10-CM | POA: Diagnosis present

## 2017-10-14 DIAGNOSIS — E119 Type 2 diabetes mellitus without complications: Secondary | ICD-10-CM | POA: Diagnosis present

## 2017-10-14 DIAGNOSIS — M48062 Spinal stenosis, lumbar region with neurogenic claudication: Secondary | ICD-10-CM | POA: Diagnosis present

## 2017-10-14 DIAGNOSIS — G9741 Accidental puncture or laceration of dura during a procedure: Secondary | ICD-10-CM | POA: Diagnosis not present

## 2017-10-14 DIAGNOSIS — M4726 Other spondylosis with radiculopathy, lumbar region: Secondary | ICD-10-CM | POA: Diagnosis present

## 2017-10-14 DIAGNOSIS — M5136 Other intervertebral disc degeneration, lumbar region: Secondary | ICD-10-CM

## 2017-10-14 DIAGNOSIS — M545 Low back pain: Secondary | ICD-10-CM | POA: Diagnosis present

## 2017-10-14 LAB — BASIC METABOLIC PANEL
Anion gap: 14 (ref 5–15)
BUN: 18 mg/dL (ref 6–20)
CO2: 24 mmol/L (ref 22–32)
Calcium: 9.5 mg/dL (ref 8.9–10.3)
Chloride: 101 mmol/L (ref 101–111)
Creatinine, Ser: 0.98 mg/dL (ref 0.61–1.24)
GFR calc Af Amer: >60 mL/min (ref >60)
GFR calc non Af Amer: >60 mL/min (ref >60)
Glucose, Bld: 159 mg/dL — ABNORMAL HIGH (ref 65–99)
Potassium: 4.7 mmol/L (ref 3.5–5.1)
Sodium: 139 mmol/L (ref 135–145)

## 2017-10-14 LAB — POCT I-STAT 4, (NA,K, GLUC, HGB,HCT)
Glucose, Bld: 231 mg/dL — ABNORMAL HIGH (ref 65–99)
Glucose, Bld: 227 mg/dL — ABNORMAL HIGH (ref 65–99)
HCT: 40 % (ref 39.0–52.0)
HCT: 38 % — ABNORMAL LOW (ref 39.0–52.0)
Hemoglobin: 13.6 g/dL (ref 13.0–17.0)
Hemoglobin: 12.9 g/dL — ABNORMAL LOW (ref 13.0–17.0)
Potassium: 5.4 mmol/L — ABNORMAL HIGH (ref 3.5–5.1)
Potassium: 5.3 mmol/L — ABNORMAL HIGH (ref 3.5–5.1)
Sodium: 136 mmol/L (ref 135–145)
Sodium: 137 mmol/L (ref 135–145)

## 2017-10-14 LAB — CBC
HCT: 45 % (ref 39.0–52.0)
Hemoglobin: 15.7 g/dL (ref 13.0–17.0)
MCH: 32.6 pg (ref 26.0–34.0)
MCHC: 34.9 g/dL (ref 30.0–36.0)
MCV: 93.4 fL (ref 78.0–100.0)
Platelets: 274 10*3/uL (ref 150–400)
RBC: 4.82 MIL/uL (ref 4.22–5.81)
RDW: 12.4 % (ref 11.5–15.5)
WBC: 8.8 10*3/uL (ref 4.0–10.5)

## 2017-10-14 LAB — TYPE AND SCREEN
ABO/RH(D): B POS
Antibody Screen: NEGATIVE

## 2017-10-14 LAB — GLUCOSE, CAPILLARY
Glucose-Capillary: 174 mg/dL — ABNORMAL HIGH (ref 65–99)
Glucose-Capillary: 259 mg/dL — ABNORMAL HIGH (ref 65–99)

## 2017-10-14 SURGERY — POSTERIOR LUMBAR FUSION 3 LEVEL
Anesthesia: General | Site: Back

## 2017-10-14 MED ORDER — SENNA 8.6 MG PO TABS
1.0000 | ORAL_TABLET | Freq: Two times a day (BID) | ORAL | Status: DC
  Administered 2017-10-14: 8.6 mg via ORAL
  Filled 2017-10-14 (×2): qty 1

## 2017-10-14 MED ORDER — SODIUM CHLORIDE 0.9 % IV SOLN: 250.0000 mL | INTRAVENOUS | Status: DC

## 2017-10-14 MED ORDER — MENTHOL 3 MG MT LOZG: 1.0000 | LOZENGE | OROMUCOSAL | Status: DC | PRN

## 2017-10-14 MED ORDER — ARTIFICIAL TEARS OPHTHALMIC OINT
TOPICAL_OINTMENT | OPHTHALMIC | Status: AC
  Filled 2017-10-14: qty 3.5

## 2017-10-14 MED ORDER — LACTATED RINGERS IV SOLN
INTRAVENOUS | Status: DC | PRN
  Administered 2017-10-14 (×2): via INTRAVENOUS

## 2017-10-14 MED ORDER — CHLORHEXIDINE GLUCONATE CLOTH 2 % EX PADS: 6.0000 | MEDICATED_PAD | Freq: Once | CUTANEOUS | Status: DC

## 2017-10-14 MED ORDER — ONDANSETRON HCL 4 MG/2ML IJ SOLN
INTRAMUSCULAR | Status: DC | PRN
  Administered 2017-10-14: 4 mg via INTRAVENOUS

## 2017-10-14 MED ORDER — THROMBIN 5000 UNITS EX SOLR
CUTANEOUS | Status: AC
  Filled 2017-10-14: qty 5000

## 2017-10-14 MED ORDER — RAMIPRIL 5 MG PO CAPS
10.0000 mg | ORAL_CAPSULE | Freq: Every day | ORAL | Status: DC
  Administered 2017-10-15: 10 mg via ORAL
  Filled 2017-10-14: qty 2

## 2017-10-14 MED ORDER — OXYCODONE HCL 5 MG PO TABS
5.0000 mg | ORAL_TABLET | Freq: Four times a day (QID) | ORAL | Status: DC | PRN
  Administered 2017-10-14 – 2017-10-15 (×3): 5 mg via ORAL
  Filled 2017-10-14: qty 1
  Filled 2017-10-14: qty 2
  Filled 2017-10-14: qty 1

## 2017-10-14 MED ORDER — ROCURONIUM BROMIDE 10 MG/ML (PF) SYRINGE
PREFILLED_SYRINGE | INTRAVENOUS | Status: DC | PRN
  Administered 2017-10-14 (×3): 20 mg via INTRAVENOUS
  Administered 2017-10-14: 10 mg via INTRAVENOUS
  Administered 2017-10-14: 20 mg via INTRAVENOUS
  Administered 2017-10-14 (×2): 30 mg via INTRAVENOUS
  Administered 2017-10-14: 60 mg via INTRAVENOUS
  Administered 2017-10-14 (×2): 20 mg via INTRAVENOUS
  Administered 2017-10-14: 30 mg via INTRAVENOUS

## 2017-10-14 MED ORDER — DEXAMETHASONE SODIUM PHOSPHATE 10 MG/ML IJ SOLN
INTRAMUSCULAR | Status: AC
  Filled 2017-10-14: qty 1

## 2017-10-14 MED ORDER — ACETAMINOPHEN 10 MG/ML IV SOLN
INTRAVENOUS | Status: AC
  Filled 2017-10-14: qty 100

## 2017-10-14 MED ORDER — SODIUM CHLORIDE 0.9% FLUSH
3.0000 mL | Freq: Two times a day (BID) | INTRAVENOUS | Status: DC
  Administered 2017-10-15: 3 mL via INTRAVENOUS

## 2017-10-14 MED ORDER — ROCURONIUM BROMIDE 10 MG/ML (PF) SYRINGE
PREFILLED_SYRINGE | INTRAVENOUS | Status: AC
  Filled 2017-10-14: qty 5

## 2017-10-14 MED ORDER — SUGAMMADEX SODIUM 200 MG/2ML IV SOLN
INTRAVENOUS | Status: AC
  Filled 2017-10-14: qty 2

## 2017-10-14 MED ORDER — METHOCARBAMOL 500 MG PO TABS
500.0000 mg | ORAL_TABLET | Freq: Four times a day (QID) | ORAL | Status: DC | PRN
  Administered 2017-10-14 – 2017-10-15 (×3): 500 mg via ORAL
  Filled 2017-10-14 (×2): qty 1

## 2017-10-14 MED ORDER — PHENYLEPHRINE HCL 10 MG/ML IJ SOLN
INTRAVENOUS | Status: DC | PRN
  Administered 2017-10-14: 50 ug/min via INTRAVENOUS

## 2017-10-14 MED ORDER — BUPIVACAINE HCL (PF) 0.5 % IJ SOLN
INTRAMUSCULAR | Status: DC | PRN
  Administered 2017-10-14: 20 mL
  Administered 2017-10-14: 10 mL

## 2017-10-14 MED ORDER — METHOCARBAMOL 500 MG PO TABS
ORAL_TABLET | ORAL | Status: AC
Start: 2017-10-14 — End: 2017-10-15
  Filled 2017-10-14: qty 1

## 2017-10-14 MED ORDER — CEFAZOLIN SODIUM 1 G IJ SOLR
INTRAMUSCULAR | Status: AC
  Filled 2017-10-14: qty 40

## 2017-10-14 MED ORDER — HYDROCODONE-ACETAMINOPHEN 5-325 MG PO TABS
2.0000 | ORAL_TABLET | ORAL | Status: DC | PRN
  Administered 2017-10-14 – 2017-10-15 (×2): 2 via ORAL
  Filled 2017-10-14: qty 2

## 2017-10-14 MED ORDER — DOCUSATE SODIUM 100 MG PO CAPS
100.0000 mg | ORAL_CAPSULE | Freq: Two times a day (BID) | ORAL | Status: DC
Start: 2017-10-14 — End: 2017-10-15
  Administered 2017-10-14: 100 mg via ORAL
  Filled 2017-10-14 (×2): qty 1

## 2017-10-14 MED ORDER — HYDROCODONE-ACETAMINOPHEN 5-325 MG PO TABS: 1.0000 | ORAL_TABLET | ORAL | Status: DC | PRN

## 2017-10-14 MED ORDER — BUPIVACAINE HCL (PF) 0.5 % IJ SOLN
INTRAMUSCULAR | Status: AC
  Filled 2017-10-14: qty 30

## 2017-10-14 MED ORDER — HYDROCODONE-ACETAMINOPHEN 5-325 MG PO TABS
ORAL_TABLET | ORAL | Status: AC
  Filled 2017-10-14: qty 2

## 2017-10-14 MED ORDER — GELATIN ABSORBABLE MT POWD
OROMUCOSAL | Status: DC | PRN
  Administered 2017-10-14 (×2): via TOPICAL

## 2017-10-14 MED ORDER — ONDANSETRON HCL 4 MG/2ML IJ SOLN
INTRAMUSCULAR | Status: AC
  Filled 2017-10-14: qty 2

## 2017-10-14 MED ORDER — LIDOCAINE-EPINEPHRINE 1 %-1:100000 IJ SOLN
INTRAMUSCULAR | Status: AC
  Filled 2017-10-14: qty 1

## 2017-10-14 MED ORDER — KETOROLAC TROMETHAMINE 30 MG/ML IJ SOLN
INTRAMUSCULAR | Status: AC
  Filled 2017-10-14: qty 1

## 2017-10-14 MED ORDER — MIDAZOLAM HCL 2 MG/2ML IJ SOLN
INTRAMUSCULAR | Status: DC | PRN
  Administered 2017-10-14: 2 mg via INTRAVENOUS

## 2017-10-14 MED ORDER — SODIUM CHLORIDE 0.9% FLUSH: 3.0000 mL | INTRAVENOUS | Status: DC | PRN

## 2017-10-14 MED ORDER — ACETAMINOPHEN 10 MG/ML IV SOLN
INTRAVENOUS | Status: DC | PRN
  Administered 2017-10-14: 1000 mg via INTRAVENOUS

## 2017-10-14 MED ORDER — HYDROMORPHONE HCL 1 MG/ML IJ SOLN
INTRAMUSCULAR | Status: AC
  Filled 2017-10-14: qty 1

## 2017-10-14 MED ORDER — BACLOFEN 10 MG PO TABS: 10.0000 mg | ORAL_TABLET | Freq: Two times a day (BID) | ORAL | Status: DC | PRN

## 2017-10-14 MED ORDER — PHENOL 1.4 % MT LIQD: 1.0000 | OROMUCOSAL | Status: DC | PRN

## 2017-10-14 MED ORDER — DEXAMETHASONE SODIUM PHOSPHATE 10 MG/ML IJ SOLN
INTRAMUSCULAR | Status: DC | PRN
  Administered 2017-10-14: 5 mg via INTRAVENOUS

## 2017-10-14 MED ORDER — DULOXETINE HCL 60 MG PO CPEP
90.0000 mg | ORAL_CAPSULE | Freq: Every day | ORAL | Status: DC
  Administered 2017-10-15: 90 mg via ORAL
  Filled 2017-10-14: qty 1

## 2017-10-14 MED ORDER — ALUM & MAG HYDROXIDE-SIMETH 200-200-20 MG/5ML PO SUSP: 30.0000 mL | Freq: Four times a day (QID) | ORAL | Status: DC | PRN

## 2017-10-14 MED ORDER — LIDOCAINE 2% (20 MG/ML) 5 ML SYRINGE
INTRAMUSCULAR | Status: AC
  Filled 2017-10-14: qty 5

## 2017-10-14 MED ORDER — LIDOCAINE-EPINEPHRINE 1 %-1:100000 IJ SOLN
INTRAMUSCULAR | Status: DC | PRN
  Administered 2017-10-14: 10 mL

## 2017-10-14 MED ORDER — BISACODYL 10 MG RE SUPP: 10.0000 mg | Freq: Every day | RECTAL | Status: DC | PRN

## 2017-10-14 MED ORDER — GABAPENTIN 400 MG PO CAPS
800.0000 mg | ORAL_CAPSULE | Freq: Three times a day (TID) | ORAL | Status: DC
  Administered 2017-10-14 – 2017-10-15 (×2): 800 mg via ORAL
  Filled 2017-10-14 (×2): qty 2

## 2017-10-14 MED ORDER — FENTANYL CITRATE (PF) 250 MCG/5ML IJ SOLN
INTRAMUSCULAR | Status: AC
  Filled 2017-10-14: qty 5

## 2017-10-14 MED ORDER — POLYETHYLENE GLYCOL 3350 17 G PO PACK: 17.0000 g | PACK | Freq: Every day | ORAL | Status: DC | PRN

## 2017-10-14 MED ORDER — FLEET ENEMA 7-19 GM/118ML RE ENEM: 1.0000 | ENEMA | Freq: Once | RECTAL | Status: DC | PRN

## 2017-10-14 MED ORDER — METFORMIN HCL 500 MG PO TABS
500.0000 mg | ORAL_TABLET | Freq: Two times a day (BID) | ORAL | Status: DC
  Administered 2017-10-15: 500 mg via ORAL
  Filled 2017-10-14: qty 1

## 2017-10-14 MED ORDER — PHENYLEPHRINE 40 MCG/ML (10ML) SYRINGE FOR IV PUSH (FOR BLOOD PRESSURE SUPPORT)
PREFILLED_SYRINGE | INTRAVENOUS | Status: AC
  Filled 2017-10-14: qty 20

## 2017-10-14 MED ORDER — 0.9 % SODIUM CHLORIDE (POUR BTL) OPTIME
TOPICAL | Status: DC | PRN
  Administered 2017-10-14: 1000 mL

## 2017-10-14 MED ORDER — AMLODIPINE BESYLATE 5 MG PO TABS
5.0000 mg | ORAL_TABLET | Freq: Every day | ORAL | Status: DC
  Administered 2017-10-15: 5 mg via ORAL
  Filled 2017-10-14: qty 1

## 2017-10-14 MED ORDER — THROMBIN 20000 UNITS EX SOLR
CUTANEOUS | Status: AC
  Filled 2017-10-14: qty 20000

## 2017-10-14 MED ORDER — METHOCARBAMOL 1000 MG/10ML IJ SOLN
500.0000 mg | Freq: Four times a day (QID) | INTRAVENOUS | Status: DC | PRN
  Filled 2017-10-14: qty 5

## 2017-10-14 MED ORDER — SODIUM CHLORIDE 0.9 % IR SOLN
Status: DC | PRN
  Administered 2017-10-14: 07:00:00

## 2017-10-14 MED ORDER — ARTIFICIAL TEARS OPHTHALMIC OINT
TOPICAL_OINTMENT | OPHTHALMIC | Status: DC | PRN
  Administered 2017-10-14: 1 via OPHTHALMIC

## 2017-10-14 MED ORDER — SODIUM CHLORIDE 0.9 % IV SOLN
INTRAVENOUS | Status: DC | PRN
  Administered 2017-10-14: 13:00:00 via INTRAVENOUS

## 2017-10-14 MED ORDER — PHENYLEPHRINE HCL 10 MG/ML IJ SOLN
INTRAMUSCULAR | Status: DC | PRN
  Administered 2017-10-14 (×3): 80 ug via INTRAVENOUS

## 2017-10-14 MED ORDER — ONDANSETRON HCL 4 MG PO TABS
4.0000 mg | ORAL_TABLET | Freq: Four times a day (QID) | ORAL | Status: DC | PRN
Start: 2017-10-14 — End: 2017-10-15

## 2017-10-14 MED ORDER — PROPOFOL 10 MG/ML IV BOLUS
INTRAVENOUS | Status: AC
  Filled 2017-10-14: qty 40

## 2017-10-14 MED ORDER — OXYCODONE-ACETAMINOPHEN 10-325 MG PO TABS: 1.0000 | ORAL_TABLET | Freq: Four times a day (QID) | ORAL | Status: DC | PRN

## 2017-10-14 MED ORDER — ALBUMIN HUMAN 5 % IV SOLN
INTRAVENOUS | Status: DC | PRN
  Administered 2017-10-14: 14:00:00 via INTRAVENOUS

## 2017-10-14 MED ORDER — ONDANSETRON HCL 4 MG/2ML IJ SOLN: 4.0000 mg | Freq: Four times a day (QID) | INTRAMUSCULAR | Status: DC | PRN

## 2017-10-14 MED ORDER — OXYCODONE-ACETAMINOPHEN 5-325 MG PO TABS
1.0000 | ORAL_TABLET | Freq: Four times a day (QID) | ORAL | Status: DC | PRN
  Administered 2017-10-14 – 2017-10-15 (×3): 1 via ORAL
  Filled 2017-10-14: qty 2
  Filled 2017-10-14 (×2): qty 1

## 2017-10-14 MED ORDER — THROMBIN (RECOMBINANT) 20000 UNITS EX SOLR
CUTANEOUS | Status: DC | PRN
  Administered 2017-10-14: 07:00:00 via TOPICAL

## 2017-10-14 MED ORDER — ACETAMINOPHEN 325 MG PO TABS: 650.0000 mg | ORAL_TABLET | ORAL | Status: DC | PRN

## 2017-10-14 MED ORDER — EPHEDRINE SULFATE 50 MG/ML IJ SOLN
INTRAMUSCULAR | Status: DC | PRN
  Administered 2017-10-14: 5 mg via INTRAVENOUS

## 2017-10-14 MED ORDER — GABAPENTIN 800 MG PO TABS
800.0000 mg | ORAL_TABLET | Freq: Three times a day (TID) | ORAL | Status: DC
  Filled 2017-10-14: qty 1

## 2017-10-14 MED ORDER — MIDAZOLAM HCL 2 MG/2ML IJ SOLN
INTRAMUSCULAR | Status: AC
  Filled 2017-10-14: qty 2

## 2017-10-14 MED ORDER — CEFAZOLIN SODIUM-DEXTROSE 2-4 GM/100ML-% IV SOLN
2.0000 g | INTRAVENOUS | Status: AC
  Administered 2017-10-14 (×3): 2 g via INTRAVENOUS
  Filled 2017-10-14: qty 100

## 2017-10-14 MED ORDER — BUPROPION HCL ER (XL) 150 MG PO TB24
150.0000 mg | ORAL_TABLET | Freq: Two times a day (BID) | ORAL | Status: DC
  Administered 2017-10-14 – 2017-10-15 (×2): 150 mg via ORAL
  Filled 2017-10-14 (×2): qty 1

## 2017-10-14 MED ORDER — MORPHINE SULFATE (PF) 4 MG/ML IV SOLN
4.0000 mg | INTRAVENOUS | Status: DC | PRN
  Administered 2017-10-14 – 2017-10-15 (×5): 4 mg via INTRAVENOUS
  Filled 2017-10-14 (×5): qty 1

## 2017-10-14 MED ORDER — CEFAZOLIN SODIUM-DEXTROSE 2-4 GM/100ML-% IV SOLN
2.0000 g | Freq: Three times a day (TID) | INTRAVENOUS | Status: AC
  Administered 2017-10-15 (×2): 2 g via INTRAVENOUS
  Filled 2017-10-14 (×2): qty 100

## 2017-10-14 MED ORDER — FENTANYL CITRATE (PF) 250 MCG/5ML IJ SOLN
INTRAMUSCULAR | Status: DC | PRN
  Administered 2017-10-14 (×3): 50 ug via INTRAVENOUS
  Administered 2017-10-14: 150 ug via INTRAVENOUS
  Administered 2017-10-14 (×4): 50 ug via INTRAVENOUS

## 2017-10-14 MED ORDER — SUGAMMADEX SODIUM 200 MG/2ML IV SOLN
INTRAVENOUS | Status: DC | PRN
  Administered 2017-10-14: 200 mg via INTRAVENOUS

## 2017-10-14 MED ORDER — LIDOCAINE 2% (20 MG/ML) 5 ML SYRINGE
INTRAMUSCULAR | Status: DC | PRN
  Administered 2017-10-14: 80 mg via INTRAVENOUS

## 2017-10-14 MED ORDER — HYDROMORPHONE HCL 1 MG/ML IJ SOLN
0.2500 mg | INTRAMUSCULAR | Status: DC | PRN
  Administered 2017-10-14 (×4): 0.5 mg via INTRAVENOUS

## 2017-10-14 MED ORDER — ACETAMINOPHEN 650 MG RE SUPP: 650.0000 mg | RECTAL | Status: DC | PRN

## 2017-10-14 MED ORDER — PROPOFOL 10 MG/ML IV BOLUS
INTRAVENOUS | Status: DC | PRN
  Administered 2017-10-14: 200 mg via INTRAVENOUS

## 2017-10-14 MED ORDER — OXYCODONE-ACETAMINOPHEN 10-325 MG PO TABS: 1.0000 | ORAL_TABLET | Freq: Four times a day (QID) | ORAL | 0 refills | Status: DC | PRN

## 2017-10-14 MED ORDER — EPHEDRINE 5 MG/ML INJ
INTRAVENOUS | Status: AC
  Filled 2017-10-14: qty 10

## 2017-10-14 MED ORDER — LACTATED RINGERS IV SOLN: INTRAVENOUS | Status: DC

## 2017-10-14 MED ORDER — SODIUM CHLORIDE 0.9 % IJ SOLN
INTRAMUSCULAR | Status: AC
  Filled 2017-10-14: qty 10

## 2017-10-14 MED ORDER — GUAIFENESIN ER 600 MG PO TB12: 600.0000 mg | ORAL_TABLET | Freq: Two times a day (BID) | ORAL | Status: DC | PRN

## 2017-10-14 SURGICAL SUPPLY — 67 items
ADH SKN CLS APL DERMABOND .7 (GAUZE/BANDAGES/DRESSINGS) ×2
APL SRG 60D 8 XTD TIP BNDBL (TIP)
BAG DECANTER FOR FLEXI CONT (MISCELLANEOUS) ×2 IMPLANT
BASKET BONE COLLECTION (BASKET) ×2 IMPLANT
BLADE CLIPPER SURG (BLADE) IMPLANT
BUR MATCHSTICK NEURO 3.0 LAGG (BURR) ×2 IMPLANT
CANISTER SUCT 3000ML PPV (MISCELLANEOUS) ×2 IMPLANT
CONT SPEC 4OZ CLIKSEAL STRL BL (MISCELLANEOUS) ×2 IMPLANT
COVER BACK TABLE 60X90IN (DRAPES) ×2 IMPLANT
DERMABOND ADVANCED (GAUZE/BANDAGES/DRESSINGS) ×2
DERMABOND ADVANCED .7 DNX12 (GAUZE/BANDAGES/DRESSINGS) ×1 IMPLANT
DEVICE DISSECT PLASMABLAD 3.0S (MISCELLANEOUS) ×1 IMPLANT
DRAPE C-ARM 42X72 X-RAY (DRAPES) ×4 IMPLANT
DRAPE HALF SHEET 40X57 (DRAPES) IMPLANT
DRAPE LAPAROTOMY 100X72X124 (DRAPES) ×2 IMPLANT
DRAPE POUCH INSTRU U-SHP 10X18 (DRAPES) ×2 IMPLANT
DURAPREP 26ML APPLICATOR (WOUND CARE) ×2 IMPLANT
DURASEAL APPLICATOR TIP (TIP) IMPLANT
DURASEAL SPINE SEALANT 3ML (MISCELLANEOUS) IMPLANT
ELECT REM PT RETURN 9FT ADLT (ELECTROSURGICAL) ×2
ELECTRODE REM PT RTRN 9FT ADLT (ELECTROSURGICAL) ×1 IMPLANT
GAUZE SPONGE 4X4 12PLY STRL (GAUZE/BANDAGES/DRESSINGS) ×2 IMPLANT
GAUZE SPONGE 4X4 16PLY XRAY LF (GAUZE/BANDAGES/DRESSINGS) ×2 IMPLANT
GLOVE BIOGEL PI IND STRL 6.5 (GLOVE) IMPLANT
GLOVE BIOGEL PI IND STRL 7.5 (GLOVE) IMPLANT
GLOVE BIOGEL PI IND STRL 8.5 (GLOVE) ×2 IMPLANT
GLOVE BIOGEL PI INDICATOR 6.5 (GLOVE) ×1
GLOVE BIOGEL PI INDICATOR 7.5 (GLOVE) ×5
GLOVE BIOGEL PI INDICATOR 8.5 (GLOVE) ×2
GLOVE ECLIPSE 7.0 STRL STRAW (GLOVE) ×1 IMPLANT
GLOVE ECLIPSE 8.5 STRL (GLOVE) ×4 IMPLANT
GLOVE SURG SS PI 6.0 STRL IVOR (GLOVE) ×3 IMPLANT
GOWN STRL REUS W/ TWL LRG LVL3 (GOWN DISPOSABLE) ×3 IMPLANT
GOWN STRL REUS W/ TWL XL LVL3 (GOWN DISPOSABLE) IMPLANT
GOWN STRL REUS W/TWL 2XL LVL3 (GOWN DISPOSABLE) ×4 IMPLANT
GOWN STRL REUS W/TWL LRG LVL3 (GOWN DISPOSABLE) ×6
GOWN STRL REUS W/TWL XL LVL3 (GOWN DISPOSABLE) ×4
HEMOSTAT POWDER KIT SURGIFOAM (HEMOSTASIS) ×2 IMPLANT
KIT BASIN OR (CUSTOM PROCEDURE TRAY) ×2 IMPLANT
KIT ROOM TURNOVER OR (KITS) ×2 IMPLANT
MILL MEDIUM DISP (BLADE) ×2 IMPLANT
NEEDLE HYPO 22GX1.5 SAFETY (NEEDLE) ×2 IMPLANT
NS IRRIG 1000ML POUR BTL (IV SOLUTION) ×2 IMPLANT
PACK LAMINECTOMY NEURO (CUSTOM PROCEDURE TRAY) ×2 IMPLANT
PAD ARMBOARD 7.5X6 YLW CONV (MISCELLANEOUS) ×6 IMPLANT
PATTIES SURGICAL .5 X1 (DISPOSABLE) ×2 IMPLANT
PATTIES SURGICAL 1X1 (DISPOSABLE) ×1 IMPLANT
PLASMABLADE 3.0S (MISCELLANEOUS) ×2
ROD TI CVD VIT 5.5X85 (Rod) ×1 IMPLANT
ROD VITALITY CRVD 5.5X75MM (Rod) ×2 IMPLANT
SCREW 6.5X55 PA VITALITY (Spacer) ×4 IMPLANT
SCREW VITALITY PA 6.5X45MM (Screw) ×4 IMPLANT
SCREW VITALITY PA 6.5X50MM (Screw) ×2 IMPLANT
SPACER ZYSTON STR 10X25X10X8 (Spacer) ×12 IMPLANT
SPONGE LAP 4X18 X RAY DECT (DISPOSABLE) ×1 IMPLANT
SPONGE SURGIFOAM ABS GEL 100 (HEMOSTASIS) ×2 IMPLANT
SUT PROLENE 6 0 BV (SUTURE) ×4 IMPLANT
SUT VIC AB 1 CT1 18XBRD ANBCTR (SUTURE) ×1 IMPLANT
SUT VIC AB 1 CT1 8-18 (SUTURE) ×2
SUT VIC AB 2-0 CP2 18 (SUTURE) ×2 IMPLANT
SUT VIC AB 3-0 SH 8-18 (SUTURE) ×2 IMPLANT
SYR 3ML LL SCALE MARK (SYRINGE) ×8 IMPLANT
TOP CLOSURE TORQ LIMIT (Neuro Prosthesis/Implant) ×16 IMPLANT
TOWEL GREEN STERILE (TOWEL DISPOSABLE) ×2 IMPLANT
TOWEL GREEN STERILE FF (TOWEL DISPOSABLE) ×2 IMPLANT
TRAY FOLEY W/METER SILVER 16FR (SET/KITS/TRAYS/PACK) ×2 IMPLANT
WATER STERILE IRR 1000ML POUR (IV SOLUTION) ×2 IMPLANT

## 2017-10-14 NOTE — Transfer of Care (Signed)
Immediate Anesthesia Transfer of Care Note  Patient: Elizar L Vitelli  Procedure(s) Performed: LUMBAR THREE-FOUR LUMBAR FOUR-FIVE LUMBAR FIVE-SACRAL ONE POSTERIOR LUMBAR INTERBODY FUSION (N/A Back)  Patient Location: PACU  Anesthesia Type:General  Level of Consciousness: awake, alert , oriented and patient cooperative  Airway & Oxygen Therapy: Patient Spontanous Breathing and Patient connected to face mask oxygen  Post-op Assessment: Report given to RN, Post -op Vital signs reviewed and stable and Patient moving all extremities X 4  Post vital signs: Reviewed and stable  Last Vitals:  Vitals:   10/14/17 0624 10/14/17 1621  BP: (!) 131/95 (!) 142/120  Pulse: 67 94  Resp: 18 13  Temp: 36.7 C 36.7 C  SpO2: 97% 97%    Last Pain:  Vitals:   10/14/17 1621  TempSrc:   PainSc: (P) 8          Complications: No apparent anesthesia complications

## 2017-10-14 NOTE — Anesthesia Procedure Notes (Signed)
Procedure Name: Intubation Date/Time: 10/14/2017 7:42 AM Performed by: Clearnce Sorrel, CRNA Pre-anesthesia Checklist: Patient identified, Suction available, Emergency Drugs available, Patient being monitored and Timeout performed Patient Re-evaluated:Patient Re-evaluated prior to induction Oxygen Delivery Method: Circle system utilized Preoxygenation: Pre-oxygenation with 100% oxygen Induction Type: IV induction Ventilation: Mask ventilation without difficulty and Oral airway inserted - appropriate to patient size Laryngoscope Size: Mac and 4 Grade View: Grade I Number of attempts: 1 Placement Confirmation: ETT inserted through vocal cords under direct vision,  positive ETCO2 and breath sounds checked- equal and bilateral Secured at: 23 cm Tube secured with: Tape Dental Injury: Teeth and Oropharynx as per pre-operative assessment

## 2017-10-14 NOTE — Telephone Encounter (Signed)
Oxycodone not on med list

## 2017-10-14 NOTE — Telephone Encounter (Signed)
He was on percocet. I have refilled one time to [facility]. Had back surgery today.

## 2017-10-14 NOTE — H&P (Signed)
John Ware is an 52 y.o. male.   Chief Complaint: Back and bilateral leg pain and weakness, HPI: John Ware is a 53 yo individual that I have treated for a number of years with conservative effort. He has known cervical spondylosis which has been decompressed. 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 An Mri demonstrates that he is developing worsening stenosis at L3-4 ,4-5 and L5S1. After extensive efforts at conservative treatment I advised consideration of surgery to decompress and stabilize L3 to the sacrum. 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  Past Medical History:  Diagnosis Date  . Depression   . Diabetes mellitus without complication (HCC)   . Hypertension   . Spinal stenosis    With Neurogenic Claudication    Past Surgical History:  Procedure Laterality Date  . CARPAL TUNNEL RELEASE Bilateral   . LUMBAR LAMINECTOMY/DECOMPRESSION MICRODISCECTOMY N/A 12/13/2014   Procedure: LUMBAR THREE-FOUR, LUMBAR FOUR-FIVE, LUMBAR FIVE-SACRAL ONE LAMINECTOMY;  Surgeon: [name], MD;  Location: [facility];  Service: Neurosurgery;  Laterality: N/A;  L3-4 L4-5 L5-S1 Laminectomy  . MENISCUS REPAIR     Right knee  . 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 VASECTOMY      History reviewed. No pertinent family history. Social History:  reports that he has quit smoking. he has never used smokeless tobacco. He reports that he drinks alcohol. He reports that he does not use drugs.  Allergies:  Allergies  Allergen Reactions  . Tizanidine     Leg cramps    Medications Prior to Admission  Medication Sig Dispense Refill  . amLODipine (NORVASC) 5 MG tablet Take 1 tablet (5 mg total) by mouth daily. 90 tablet 3  . baclofen (LIORESAL) 10 MG tablet Take 1 tablet (10 mg total) by mouth 2 (two) times daily.  (Patient taking differently: Take 10 mg by mouth 2 (two) times daily as needed for muscle spasms. ) 60 each 2  . buPROPion (WELLBUTRIN XL) 150 MG 24 hr tablet TAKE  (1)  TABLET TWICE A DAY. 60 tablet 11  . DULoxetine (CYMBALTA) 30 MG capsule Take 3 capsules (90 mg total) by mouth daily. 90 capsule 5  . gabapentin (NEURONTIN) 800 MG tablet TAKE  (1)  TABLET  THREE TIMES DAILY. 90 tablet 11  . guaiFENesin (MUCINEX) 600 MG 12 hr tablet Take 600 mg by mouth 2 (two) times daily as needed (congestion).    . metFORMIN (GLUCOPHAGE) 500 MG tablet TAKE  (1)  TABLET  THREE TIMES DAILY WITH MEALS. 90 tablet 3  . naproxen sodium (ALEVE) 220 MG tablet Take 220 mg by mouth daily as needed (pain / headaches).    . oxyCODONE-acetaminophen (PERCOCET) 10-325 MG tablet Take 1-2 tablets by mouth every 6 (six) hours as needed for pain. 240 tablet 0  . ramipril (ALTACE) 10 MG capsule Take 1 capsule (10 mg total) by mouth daily. 30 capsule 11  . tiZANidine (ZANAFLEX) 4 MG tablet Take 1 capsule (4 mg total) by mouth 3 (three) times daily. (Patient not taking: Reported on 09/19/2017) 60 tablet 5    No results found for this or any previous visit (from the past 48 hour(s)). No results found.  Review of Systems  HENT: Negative.   Eyes: Negative.   Respiratory: Negative.   Cardiovascular: Negative.   Gastrointestinal: Negative.  Genitourinary: Negative.   Musculoskeletal: Positive for back pain and neck pain.  Skin: Negative.   Neurological: Positive for tingling, focal weakness and weakness.       Lack of stamina on feet  Endo/Heme/Allergies: Negative.   Psychiatric/Behavioral: Negative.     There were no vitals taken for this visit. Physical Exam  Constitutional: He is oriented to person, place, and time. He appears well-developed and well-nourished.  HENT:  Head: Normocephalic and atraumatic.  Eyes: Conjunctivae and EOM are normal. Pupils are equal, round, and reactive to light.  Neck: Normal range of  motion. Neck supple.  Cardiovascular: Normal rate and regular rhythm.  Respiratory: Effort normal and breath sounds normal.  GI: Soft. Bowel sounds are normal.  Musculoskeletal: He exhibits tenderness.  Positive SLR bilaterally  Neurological: He is alert and oriented to person, place, and time. He displays abnormal reflex. No cranial nerve deficit. He exhibits normal muscle tone. Coordination normal.  Absent DTRs upper and lower. Mild decrease in stregnth in tibialis anterior bilaterally  Skin: Skin is warm and dry.  Psychiatric: He has a normal mood and affect. His behavior is normal. Judgment and thought content normal.     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 Plan: Decompression and fusion L3 to Sacrum  Stefani DamaELSNER,Gaddiel Cullens J, MD 10/14/2017, 6:04 AM

## 2017-10-14 NOTE — Op Note (Signed)
Date of surgery: 10/14/2017 Preoperative diagnosis: Lumbar spondylosis with radiculopathy and stenosis L3-4 L4-5 L5-S1 Postoperative diagnosis: Lumbar spondylosis with radiculopathy and stenosis L3-4 L4-5 L5-S1 Procedure: Lumbar decompression via laminectomy L3-S1 with decompression of  L3 L4 and L5 and S1 nerve roots, posterior lumbar interbody arthrodesis using peek spacers autograft  at L3-4 L4-5 L5-S1, pedicle screw fixation L3 to sacrum, posterior lateral arthrodesis with autograft  L3 to sacrum  Surgeon: Barnett AbuHenry Kallan Merrick M.D. Assistant: Dr. Freda JacksonNeelesh Nunudkumar Anesthesia: Gen. endotracheal Indications: Patient is a 52 year old individual who's had significant back pain bilateral lower extremity pain was severe spondylitic stenosis. He's been advised regarding the need for surgery  Procedure: He was brought to the operating room. After the smooth induction of general endotracheal anesthesia the patient was carefully turned to the prone position padding the bony prominences appropriately. The back was prepped with alcohol and DuraPrep and draped in a sterile fashion. A midline incision was created and carried down to the lumbar dorsal fascia. Fascia was opened on either side of the midline to expose the spinous processes of the lower lumbar spine. These were identified as L3 and L4 on the radiograph. The dissection was then completed to expose L3 to the sacrum.  Laminectomy was then created removing the entirety of the lamina of L3-L4 and L5. There was significant scar tissue from previous surgery. There was a dural tear on the right side at the L5 nerve root extending for 2 cm. This was closed primarily with 6-0 Prolene suture. No CSF leakage was noted subsequently. The individual nerve roots were carefully decompressed at L3-L4 and L5 and the S1 nerve roots. The disc spaces were isolated. Then discectomy was performed at L5-S1 L4-L5 and L3-L4. Endplates are rongeured smooth. Ultimately 10 mm tall  spacers with a degrees of lordosis 25 mm in length were packed with autograft and placed into the interspace along with 12 mL of bone graft at the L5-S1 space 12 mL of bone graft at L4-L5 and 90 mL of bone graft at L3-L4. Lateral gutters were decorticated and packed in the intertransverse spaces from L3 L4 L5 and the sacrum. Pedicle entry sites were chosen at the L3 L4-L5 and S1 areas and 6.5 x 55 mm screws were placed in L3 6.5 x 45 mm screws were placed in L4 and L5 and 6.5 x 50 mm screws were placed in the sacrum.   75 mm precontoured rod was placed on the right and an 85 mm precontoured rod appropriately  placed into the left lateral gutter connecting the screws together. The lateral gutters were packed with a mixture of autograft and allograft. The system was torqued into final locking position. Vital radiographs confirm good position of the hardware and stable alignment of the spine. Wound was irrigated copiously with antibiotic irrigating solution and then closed with #1 Vicryl in interrupted fashion 20 cc of half percent Marcaine was injected into the paraspinous fascia at the time of closure. Subcutaneous tissues was clipped were closed with 2-0 Vicryl and 3-0 Vicryl is used to close subcuticular skin. Blood loss was estimated at 900 mL, 300 mL of Cell Saver blood was returned to the patient.

## 2017-10-15 LAB — CBC
HCT: 32.6 % — ABNORMAL LOW (ref 39.0–52.0)
Hemoglobin: 11.1 g/dL — ABNORMAL LOW (ref 13.0–17.0)
MCH: 32.6 pg (ref 26.0–34.0)
MCHC: 34 g/dL (ref 30.0–36.0)
MCV: 95.9 fL (ref 78.0–100.0)
Platelets: 235 10*3/uL (ref 150–400)
RBC: 3.4 MIL/uL — ABNORMAL LOW (ref 4.22–5.81)
RDW: 12.8 % (ref 11.5–15.5)
WBC: 15.2 10*3/uL — ABNORMAL HIGH (ref 4.0–10.5)

## 2017-10-15 MED ORDER — METHOCARBAMOL 500 MG PO TABS: 500.0000 mg | ORAL_TABLET | Freq: Four times a day (QID) | ORAL | 3 refills | Status: DC | PRN

## 2017-10-15 MED ORDER — OXYCODONE-ACETAMINOPHEN 10-325 MG PO TABS: 1.0000 | ORAL_TABLET | ORAL | 0 refills | Status: DC | PRN

## 2017-10-15 MED ORDER — DIAZEPAM 5 MG PO TABS: 5.0000 mg | ORAL_TABLET | Freq: Four times a day (QID) | ORAL | 0 refills | Status: DC | PRN

## 2017-10-15 MED FILL — Thrombin For Soln 5000 Unit: CUTANEOUS | Qty: 5000 | Status: AC

## 2017-10-15 MED FILL — Gelatin Absorbable MT Powder: OROMUCOSAL | Qty: 1 | Status: AC

## 2017-10-15 MED FILL — Thrombin For Soln 20000 Unit: CUTANEOUS | Qty: 1 | Status: AC

## 2017-10-15 NOTE — Progress Notes (Signed)
Day RN and Night RN walked into patients room to introduce shift change and ask patient if he needed anything. At this time, patient started to curse at both nurses stating that he had not seen anyone for the past 30 mins. Both RN's explained to patient that he was placed in the bed by multiple staff members just one hour prior and all needs was addressed at that time. Patient stated that this was not true and continued to yell at both nurses. He then stated that his brace was suppose to arrive before he came on the floor but he had not received it. The day RN tried to explain to patient that she called for the brace but it was stated that it would not arrive until morning because an outside vendor was used. He then again stated that the day RN was lying and the he would be calling the police. During his call to the police, staff called security. Security tried to calm the patient down but he continued to yell at staff stating that we all did not know what we were doing and that he wanted a "real" nurse. Patient even begun yelling at security and stated that he did not care anymore. The ED [place] police arrived and tried to calm the situation but patient stated that he would just like another nurse. The charge nurse was standing outside the door gathering information about the patient when she was called in the room to take over his care. Patient continued to curse at the day RN and this RN stating we did not know how to take proper care of him. Charge RN has taken over at this time.

## 2017-10-15 NOTE — Progress Notes (Signed)
Orthopedic Tech Progress Note Patient Details:  John Ware 08/15/1966 [id number]  Patient ID: John OharaMartie L Ware, male   DOB: 02/12/1966, 52 y.o.   MRN: [id number]   John Ware, John Ware 10/15/2017, 9:04 AM Called in bio-tech brace order; spoke with John Ware

## 2017-10-15 NOTE — Progress Notes (Signed)
During report this am, patient began screaming and cursing at staff. Attempted to calm patient with no success. Security and [employer] called and at bedside. Neurosurgery PA also rounding at this time and attempted to assist patient. Again, no success. PRNs given to patient and educated patient about being violent with staff. Little relief from PRNs per patient. Awaiting Aspen brace and plan to discharge patient when attending rounds. Plan discussed with patient and patient agrees to be more cooperative until discharge. Attending notified. Will continue to monitor pt closely. Jillyn HiddenStone,Carman Auxier R, RN

## 2017-10-15 NOTE — Discharge Summary (Signed)
Physician Discharge Summary  Patient ID: SLAYTER MOORHOUSE MRN: [id number] DOB/AGE: 30-Mar-1966 52 y.o.  Admit date: 10/14/2017 Discharge date: 10/15/2017  Admission Diagnoses: Lumbar spondylosis and stenosis L3-4 L4-5 L5-S1, neurogenic claudication, lumbar radiculopathy. Diabetes mellitus.  Discharge Diagnoses: Lumbar spondylosis and stenosis L3-4 L4-5 L5-S1, neurogenic claudication, lumbar radiculopathy. Diabetes mellitus. Skin breakdown over chin measuring 2 x 4 cm  Active Problems:   Lumbar stenosis with neurogenic claudication   Discharged Condition: good  Hospital Course: Patient was admitted to undergo surgical decompression arthrodesis L3 to sacrum. He tolerated surgery well. Postoperatively the patient was noted to become rather agitated regarding complaints of pain poorly fitting Foley catheter. He summoned 911 and required some significant efforts at restraint and calming. He recalls the events fully from yesterday and apparently this morning had another similar episode of being very irritable and combative and demanding regarding pain medication and poor pain control. My conversation with the patient he noted that he did not feel he is being cared for adequately and felt he could do as well at home. On examination I note that he is ambulatory demonstrated being able to place himself in and out of the brace got from a lying down to the standing position without difficulty and demonstrated good overall mobility. His incision is clean and dry. He'll be given a prescription for pain medication and I discussed his postoperative care with him advising good back mechanics effort to induce bowel movements and and careful use of the pain medication. He comprehended all these things and was grateful to be being discharged.  Consults: None  Significant Diagnostic Studies: None  Treatments: surgery: Lumbar laminectomy L3 L4 L5 decompression of the L3 L4 L5 and S1 nerve roots. Posterior lateral  arthrodesis with autograft posterior interbody arthrodesis with peek spacers and autograft L3-4 L4-5 L5-S1, segmental fixation L3 to sacrum with pedicle screws.  Discharge Exam: Blood pressure 139/84, pulse (!) 110, temperature 99.2 F (37.3 C), temperature source Oral, resp. rate 18, height 5\' 10"  (1.778 m), weight 98.9 kg (218 lb), SpO2 99 %. Incision is clean and dry. Motor function is intact station and gait are intact. Patient's mobility is good. Area of breakdown over the tip of the chin with some swelling and redness measuring 2 x 4 cm.  Disposition: 01-Home or Self Care  Discharge Instructions    Call MD for:  redness, tenderness, or signs of infection (pain, swelling, redness, odor or green/yellow discharge around incision site)   Complete by:  As directed    Call MD for:  severe uncontrolled pain   Complete by:  As directed    Call MD for:  temperature >100.4   Complete by:  As directed    Diet - low sodium heart healthy   Complete by:  As directed    Discharge instructions   Complete by:  As directed    Okay to shower. Do not apply salves or appointments to incision. No heavy lifting with the upper extremities greater than 15 pounds. May resume driving when not requiring pain medication and patient feels comfortable with doing so.   Incentive spirometry RT   Complete by:  As directed    Increase activity slowly   Complete by:  As directed      Allergies as of 10/15/2017      Reactions   Tizanidine    Leg cramps      Medication List    TAKE these medications   amLODipine 5 MG tablet Commonly known as:  NORVASC Take 1 tablet (5 mg total) by mouth daily.   baclofen 10 MG tablet Commonly known as:  LIORESAL Take 1 tablet (10 mg total) by mouth 2 (two) times daily. What changed:    when to take this  reasons to take this   buPROPion 150 MG 24 hr tablet Commonly known as:  WELLBUTRIN XL TAKE  (1)  TABLET TWICE A DAY.   DULoxetine 30 MG capsule Commonly known  as:  CYMBALTA Take 3 capsules (90 mg total) by mouth daily.   gabapentin 800 MG tablet Commonly known as:  NEURONTIN TAKE  (1)  TABLET  THREE TIMES DAILY.   guaiFENesin 600 MG 12 hr tablet Commonly known as:  MUCINEX Take 600 mg by mouth 2 (two) times daily as needed (congestion).   metFORMIN 500 MG tablet Commonly known as:  GLUCOPHAGE TAKE  (1)  TABLET  THREE TIMES DAILY WITH MEALS.   methocarbamol 500 MG tablet Commonly known as:  ROBAXIN Take 1 tablet (500 mg total) by mouth every 6 (six) hours as needed for muscle spasms.   naproxen sodium 220 MG tablet Commonly known as:  ALEVE Take 220 mg by mouth daily as needed (pain / headaches).   oxyCODONE-acetaminophen 10-325 MG tablet Commonly known as:  PERCOCET Take 1-2 tablets by mouth every 6 (six) hours as needed for pain. What changed:  Another medication with the same name was added. Make sure you understand how and when to take each.   oxyCODONE-acetaminophen 10-325 MG tablet Commonly known as:  PERCOCET Take 1 tablet by mouth every 3 (three) hours as needed for pain. What changed:  You were already taking a medication with the same name, and this prescription was added. Make sure you understand how and when to take each.   ramipril 10 MG capsule Commonly known as:  ALTACE Take 1 capsule (10 mg total) by mouth daily.   tiZANidine 4 MG tablet Commonly known as:  ZANAFLEX Take 1 capsule (4 mg total) by mouth 3 (three) times daily.        SignedStefani Dama: Lincoln Ginley J 10/15/2017, 12:00 PM

## 2017-10-15 NOTE — Progress Notes (Signed)
Pt being discharged home via wheelchair with family. Pt alert and oriented x4. VSS. Pt c/o no pain at this time. No signs of respiratory distress. Education complete and care plans resolved. IV removed with catheter intact and pt tolerated well. Incision CDI with no complications. No further issues at this time. Pt to follow up with PCP. Jillyn HiddenStone,Tyliah Schlereth R, RN

## 2017-10-15 NOTE — Evaluation (Signed)
Occupational Therapy Evaluation/Discharge Patient Details Name: John Ware MRN: [id number] DOB: 03/16/1966 Today's Date: 10/15/2017    History of Present Illness Pt is a 52 y.o. male L3-4, L4-5, L5-S1 PLIF. PMH significant for depression, diabetes mellitus without complication, hypertension, spinal stenosis.   Clinical Impression   PTA, pt was independent with ADL and functional mobility. He currently requires min assist for LB ADL and supervision for toilet and shower transfers. Back handout provided and reviewed adls in detail. Pt educated on: clothing between brace, never sleep in brace, avoid sitting for long periods of time, correct bed positioning for sleeping, correct sequence for bed mobility, avoiding lifting more than 5 pounds and never wash directly over incision. Additionally educated pt concerning compensatory ADL strategies. All education is complete and patient indicates understanding. No further OT needs identified. Will sign off.        Follow Up Recommendations  No OT follow up;Supervision/Assistance - 24 hour    Equipment Recommendations  None recommended by OT    Recommendations for Other Services       Precautions / Restrictions Precautions Precautions: Back Precaution Booklet Issued: Yes (comment) Precaution Comments: provided handout Required Braces or Orthoses: Spinal Brace Spinal Brace: Lumbar corset Restrictions Weight Bearing Restrictions: No      Mobility Bed Mobility Overal bed mobility: Modified Independent             General bed mobility comments: no physical assist required, able to perform without cues  Transfers Overall transfer level: Modified independent Equipment used: None             General transfer comment: increased time to perform no physical assist required    Balance Overall balance assessment: Mild deficits observed, not formally tested                                         ADL either  performed or assessed with clinical judgement   ADL Overall ADL's : Needs assistance/impaired Eating/Feeding: Modified independent   Grooming: Modified independent;Standing   Upper Body Bathing: Modified independent;Sitting   Lower Body Bathing: Minimal assistance;Sit to/from stand   Upper Body Dressing : Modified independent;Standing   Lower Body Dressing: Minimal assistance;Sit to/from stand   Toilet Transfer: Supervision/safety;Ambulation   Toileting- Clothing Manipulation and Hygiene: Supervision/safety;Sit to/from stand   Tub/ Shower Transfer: Supervision/safety;Ambulation;Tub transfer   Functional mobility during ADLs: Supervision/safety General ADL Comments: Educated pt concerning compensatory ADL strategies and he reports preference for his wife to assist with LB ADL. Additionally educated concerning safe tub transfers.      Vision Baseline Vision/History: Wears glasses Wears Glasses: Reading only Patient Visual Report: No change from baseline Vision Assessment?: No apparent visual deficits     Perception     Praxis      Pertinent Vitals/Pain Pain Assessment: Faces Pain Score: 10-Worst pain ever Faces Pain Scale: Hurts a little bit Pain Location: back Pain Descriptors / Indicators: Operative site guarding Pain Intervention(s): Monitored during session     Hand Dominance Left   Extremity/Trunk Assessment Upper Extremity Assessment Upper Extremity Assessment: Overall WFL for tasks assessed   Lower Extremity Assessment Lower Extremity Assessment: Defer to PT evaluation   Cervical / Trunk Assessment Cervical / Trunk Assessment: (s/p spinal surgery)   Communication Communication Communication: No difficulties   Cognition Arousal/Alertness: Awake/alert Behavior During Therapy: WFL for tasks assessed/performed Overall Cognitive Status: Within Functional Limits for  tasks assessed                                 General Comments: Note  agitated behavior prior to session but pt calm and pleasant throughout.    General Comments       Exercises     Shoulder Instructions      Home Living Family/patient expects to be discharged to:: Private residence Living Arrangements: Spouse/significant other Available Help at Discharge: Family Type of Home: House Home Access: Stairs to enter [employer] of Steps: 3 Entrance Stairs-Rails: Right Home Layout: One level     Bathroom Shower/Tub: Tub/shower unit;Walk-in shower   Bathroom Toilet: Handicapped height     Home Equipment: Shower seat   Additional Comments: using his mother's shower seat      Prior Functioning/Environment Level of Independence: Independent                 OT Problem List: Impaired balance (sitting and/or standing);Pain      OT Treatment/Interventions:      OT Goals(Current goals can be found in the care plan section) Acute Rehab OT Goals Patient Stated Goal: go home OT Goal Formulation: With patient Time For Goal Achievement: 10/29/17 Potential to Achieve Goals: Good  OT Frequency:     Barriers to D/C:            Co-evaluation              AM-PAC PT "6 Clicks" Daily Activity     Outcome Measure Help from another person eating meals?: None Help from another person taking care of personal grooming?: None Help from another person toileting, which includes using toliet, bedpan, or urinal?: A Little Help from another person bathing (including washing, rinsing, drying)?: A Little Help from another person to put on and taking off regular upper body clothing?: None Help from another person to put on and taking off regular lower body clothing?: A Little 6 Click Score: 21   End of Session Equipment Utilized During Treatment: Back brace Nurse Communication: Mobility status  Activity Tolerance: Patient tolerated treatment well Patient left: in bed;with call bell/phone within reach(seated at EOB)  OT Visit Diagnosis:  Other abnormalities of gait and mobility (R26.89);Pain Pain - part of body: (back)                Time: 1044-1101 OT Time Calculation (min): 17 min Charges:  OT General Charges $OT Visit: 1 Visit OT Evaluation $OT Eval Moderate Complexity: 1 Mod G-Codes:     Doristine Section, MS OTR/L  Pager: [phone]   John Ware 10/15/2017, 12:27 PM

## 2017-10-15 NOTE — Evaluation (Signed)
Physical Therapy Evaluation Patient Details Name: John Ware MRN: [id number] DOB: May 29, 1966 Today's Date: 10/15/2017   History of Present Illness  patient is a 52 yo male LUMBAR THREE-FOUR LUMBAR FOUR-FIVE LUMBAR FIVE-SACRAL ONE POSTERIOR LUMBAR INTERBODY FUSION   Clinical Impression  Patient seen for mobility assessment s/p spinal surgery. Mobilizing well. Educated patient on precautions, mobility expectations, safety and car transfers. No further acute PT needs. Will sign off.     Follow Up Recommendations No PT follow up    Equipment Recommendations  None recommended by PT    Recommendations for Other Services       Precautions / Restrictions Precautions Precautions: Back Precaution Booklet Issued: Yes (comment) Precaution Comments: provided handout Required Braces or Orthoses: Spinal Brace Spinal Brace: Lumbar corset Restrictions Weight Bearing Restrictions: No      Mobility  Bed Mobility Overal bed mobility: Modified Independent             General bed mobility comments: no physical assist required, able to perform without cues  Transfers Overall transfer level: Modified independent Equipment used: None             General transfer comment: increased time to perform no physical assist required  Ambulation/Gait Ambulation/Gait assistance: Supervision;Modified independent (Device/Increase time) Ambulation Distance (Feet): 310 Feet Assistive device: None Gait Pattern/deviations: Step-through pattern;Decreased stride length     General Gait Details: steady with ambulation, educated on increased cadence for stability  Stairs Stairs: Yes Stairs assistance: Modified independent (Device/Increase time) Stair Management: Step to pattern Number of Stairs: 3 General stair comments: no physical assist required  Wheelchair Mobility    Modified Rankin (Stroke Patients Only)       Balance Overall balance assessment: Mild deficits observed, not  formally tested                                           Pertinent Vitals/Pain Pain Assessment: Faces Pain Score: 10-Worst pain ever Pain Location: back Pain Descriptors / Indicators: Operative site guarding Pain Intervention(s): Monitored during session    Home Living Family/patient expects to be discharged to:: Private residence Living Arrangements: Spouse/significant other Available Help at Discharge: Family Type of Home: House Home Access: Stairs to enter Entrance Stairs-Rails: Right Entrance Stairs-Number of Steps: 3 Home Layout: One level Home Equipment: None      Prior Function Level of Independence: Independent               Hand Dominance   Dominant Hand: Left    Extremity/Trunk Assessment   Upper Extremity Assessment Upper Extremity Assessment: Overall WFL for tasks assessed    Lower Extremity Assessment Lower Extremity Assessment: Overall WFL for tasks assessed    Cervical / Trunk Assessment Cervical / Trunk Assessment: (s/p spinal surgery)  Communication   Communication: No difficulties  Cognition Arousal/Alertness: Awake/alert Behavior During Therapy: WFL for tasks assessed/performed                                          General Comments      Exercises     Assessment/Plan    PT Assessment Patent does not need any further PT services  PT Problem List         PT Treatment Interventions      PT Goals (Current  goals can be found in the Care Plan section)  Acute Rehab PT Goals PT Goal Formulation: All assessment and education complete, DC therapy    Frequency     Barriers to discharge        Co-evaluation               AM-PAC PT "6 Clicks" Daily Activity  Outcome Measure Difficulty turning over in bed (including adjusting bedclothes, sheets and blankets)?: A Little Difficulty moving from lying on back to sitting on the side of the bed? : A Little Difficulty sitting down on and  standing up from a chair with arms (e.g., wheelchair, bedside commode, etc,.)?: A Little Help needed moving to and from a bed to chair (including a wheelchair)?: A Little Help needed walking in hospital room?: A Little Help needed climbing 3-5 steps with a railing? : A Little 6 Click Score: 18    End of Session Equipment Utilized During Treatment: Back brace Activity Tolerance: No increased pain;Patient tolerated treatment well Patient left: in bed;with call bell/phone within reach Nurse Communication: Mobility status PT Visit Diagnosis: Difficulty in walking, not elsewhere classified (R26.2)    Time: [id number] PT Time Calculation (min) (ACUTE ONLY): 19 min   Charges:   PT Evaluation $PT Eval Moderate Complexity: 1 Mod     PT G Codes:        Charlotte Crumbevon Galaxy Borden, PT DPT  Board Certified Neurologic Specialist [phone]   Fabio AsaDevon J Natanya Holecek 10/15/2017, 11:36 AM

## 2017-10-15 NOTE — Progress Notes (Signed)
Patient ID: John Ware, male   DOB: 09/21/1965, 52 y.o.   MRN: [id number] Vital signs are stable Patient complained of severe pain yesterday so much that he called 911 Nurse's note very demanding behavior regarding medications, Foley catheter, muscle relaxers, This morning that behavior was repeated around 7 AM again demanding pain medication and summoning police. As I visit now several hours past that it seems that he is very pleasant with me and he read count did both last night's episode and today's episode noting that nursing care was poor and he wanted to be transferred to a different unit furthermore he notes that nothing is being done form the hospital and he feels he can be taken care of well enough at home.  He demonstrates that he can ambulate get up put on the brace with about. On examination his motor function appears completely intact in both lower extremities and I note that his incision is dry clean dressing intact I discussed with him his postoperative plan suggesting that he sit straight walk straight stand straight maintain his posture and used a brace at all time I noted that he can take a shower I will write for pain medications in the form of oxycodone No. 60 with Tylenol in addition to Robaxin. We'll see how he does at home with this early discharge.

## 2017-10-15 NOTE — Anesthesia Postprocedure Evaluation (Signed)
Anesthesia Post Note  Patient: John Ware  Procedure(s) Performed: LUMBAR THREE-FOUR LUMBAR FOUR-FIVE LUMBAR FIVE-SACRAL ONE POSTERIOR LUMBAR INTERBODY FUSION (N/A Back)     Patient location during evaluation: PACU Anesthesia Type: General Level of consciousness: awake and alert Pain management: pain level controlled Vital Signs Assessment: post-procedure vital signs reviewed and stable Respiratory status: spontaneous breathing, nonlabored ventilation and respiratory function stable Cardiovascular status: blood pressure returned to baseline and stable Postop Assessment: no apparent nausea or vomiting Anesthetic complications: no    Last Vitals:  Vitals:   10/15/17 0003 10/15/17 0318  BP: (!) 131/92 (!) 102/58  Pulse: 94 95  Resp: 18 18  Temp: 36.9 C 37.2 C  SpO2: 97% 96%    Last Pain:  Vitals:   10/15/17 0442  TempSrc:   PainSc: 10-Worst pain ever                 Amyiah Gaba,W. EDMOND

## 2017-10-17 MED FILL — Heparin Sodium (Porcine) Inj 1000 Unit/ML: INTRAMUSCULAR | Qty: 60 | Status: AC

## 2017-10-17 MED FILL — Sodium Chloride IV Soln 0.9%: INTRAVENOUS | Qty: 2000 | Status: AC

## 2017-10-24 ENCOUNTER — Ambulatory Visit (INDEPENDENT_AMBULATORY_CARE_PROVIDER_SITE_OTHER): Payer: Commercial Managed Care - PPO | Admitting: Physician Assistant

## 2017-10-24 ENCOUNTER — Encounter: Payer: Self-pay | Admitting: Physician Assistant

## 2017-10-24 VITALS — BP 159/98 | HR 107 | Temp 99.2°F | Ht 70.0 in | Wt 218.0 lb

## 2017-10-24 DIAGNOSIS — L03211 Cellulitis of face: Secondary | ICD-10-CM | POA: Diagnosis not present

## 2017-10-24 DIAGNOSIS — M5136 Other intervertebral disc degeneration, lumbar region: Secondary | ICD-10-CM | POA: Diagnosis not present

## 2017-10-24 DIAGNOSIS — M5137 Other intervertebral disc degeneration, lumbosacral region: Secondary | ICD-10-CM

## 2017-10-24 MED ORDER — HYDROMORPHONE HCL 4 MG PO TABS: 4.0000 mg | ORAL_TABLET | ORAL | 0 refills | Status: DC | PRN

## 2017-10-24 MED ORDER — CLINDAMYCIN HCL 150 MG PO CAPS: 150.0000 mg | ORAL_CAPSULE | Freq: Three times a day (TID) | ORAL | 2 refills | Status: DC

## 2017-10-24 NOTE — Patient Instructions (Signed)
In a few days you may receive a survey in the mail or online from [employer] regarding your visit with us today. Please take a moment to fill this out. Your feedback is very important to our whole office. It can help us better understand your needs as well as improve your experience and satisfaction. Thank you for taking your time to complete it. We care about you.  Sheryn Aldaz, PA-C  

## 2017-10-27 ENCOUNTER — Other Ambulatory Visit: Payer: Self-pay | Admitting: Physician Assistant

## 2017-10-27 ENCOUNTER — Encounter: Payer: Self-pay | Admitting: Physician Assistant

## 2017-10-27 MED ORDER — MORPHINE SULFATE 30 MG PO TABS: 30.0000 mg | ORAL_TABLET | ORAL | 0 refills | Status: DC | PRN

## 2017-10-27 NOTE — Progress Notes (Signed)
BP (!) 159/98   Pulse (!) 107   Temp 99.2 F (37.3 C) (Oral)   Ht 5\' 10"  (1.778 m)   Wt 218 lb (98.9 kg)   BMI 31.28 kg/m    Subjective:    Patient ID: John Ware, male    DOB: [birth year], 52 y.o.   MRN: [id number]  HPI: John Ware is a 52 y.o. male presenting on 10/24/2017 for Medication Refill  Patient reports there have been some complications with his last back surgery.  His neurosurgeon send him the same medication that we had previously had him on.  Therefore the insurance was not paying for it.  He has been very limited on pain medication and in a great deal of pain because he was not given anything stronger in the postop time.  There were some difficulties while he was at the hospital.  There was some poor aftercare when he was on the floor.  A lot of his medications were not given.  He states that the pain that he had originally that required the surgery is improved.  The pain is now involved in the surgical area.  The intubation tube laid against his face and on his left shin.  There is been a red firmness in this area for about a week.  He has had a history of cellulitis involving MRSA.  Past Medical History:  Diagnosis Date  . Depression   . Diabetes mellitus without complication (HCC)   . Hypertension   . Spinal stenosis    With Neurogenic Claudication   Relevant past medical, surgical, family and social history reviewed and updated as indicated. Interim medical history since our last visit reviewed. Allergies and medications reviewed and updated. DATA REVIEWED: CHART IN EPIC  Family History reviewed for pertinent findings.  Review of Systems  Constitutional: Negative.  Negative for appetite change and fatigue.  HENT: Negative.   Eyes: Negative.  Negative for pain and visual disturbance.  Respiratory: Negative.  Negative for cough, chest tightness, shortness of breath and wheezing.   Cardiovascular: Negative.  Negative for chest pain, palpitations and  leg swelling.  Gastrointestinal: Negative.  Negative for abdominal pain, diarrhea, nausea and vomiting.  Endocrine: Negative.   Genitourinary: Negative.   Musculoskeletal: Positive for arthralgias, back pain, gait problem and myalgias.  Skin: Positive for color change and wound. Negative for rash.  Neurological: Negative for weakness, numbness and headaches.  Psychiatric/Behavioral: Negative.     Allergies as of 10/24/2017      Reactions   Tizanidine    Leg cramps      Medication List        Accurate as of 10/24/17 11:59 PM. Always use your most recent med list.          amLODipine 5 MG tablet Commonly known as:  NORVASC Take 1 tablet (5 mg total) by mouth daily.   baclofen 10 MG tablet Commonly known as:  LIORESAL Take 1 tablet (10 mg total) by mouth 2 (two) times daily.   buPROPion 150 MG 24 hr tablet Commonly known as:  WELLBUTRIN XL TAKE  (1)  TABLET TWICE A DAY.   clindamycin 150 MG capsule Commonly known as:  CLEOCIN Take 1 capsule (150 mg total) by mouth 3 (three) times daily.   diazepam 5 MG tablet Commonly known as:  VALIUM Take 1 tablet (5 mg total) by mouth every 6 (six) hours as needed for anxiety.   DULoxetine 30 MG capsule Commonly known as:  CYMBALTA  Take 3 capsules (90 mg total) by mouth daily.   gabapentin 800 MG tablet Commonly known as:  NEURONTIN TAKE  (1)  TABLET  THREE TIMES DAILY.   guaiFENesin 600 MG 12 hr tablet Commonly known as:  MUCINEX Take 600 mg by mouth 2 (two) times daily as needed (congestion).   HYDROmorphone 4 MG tablet Commonly known as:  DILAUDID Take 1 tablet (4 mg total) by mouth every 4 (four) hours as needed for severe pain.   metFORMIN 500 MG tablet Commonly known as:  GLUCOPHAGE TAKE  (1)  TABLET  THREE TIMES DAILY WITH MEALS.   methocarbamol 500 MG tablet Commonly known as:  ROBAXIN Take 1 tablet (500 mg total) by mouth every 6 (six) hours as needed for muscle spasms.   naproxen sodium 220 MG tablet Commonly  known as:  ALEVE Take 220 mg by mouth daily as needed (pain / headaches).   ramipril 10 MG capsule Commonly known as:  ALTACE Take 1 capsule (10 mg total) by mouth daily.          Objective:    BP (!) 159/98   Pulse (!) 107   Temp 99.2 F (37.3 C) (Oral)   Ht 5\' 10"  (1.778 m)   Wt 218 lb (98.9 kg)   BMI 31.28 kg/m   Allergies  Allergen Reactions  . Tizanidine     Leg cramps    Wt Readings from Last 3 Encounters:  10/24/17 218 lb (98.9 kg)  10/14/17 218 lb (98.9 kg)  09/24/17 218 lb 1.6 oz (98.9 kg)    Physical Exam  Constitutional: He appears well-developed and well-nourished. No distress.  HENT:  Head: Normocephalic and atraumatic.  Eyes: Conjunctivae and EOM are normal. Pupils are equal, round, and reactive to light.  Cardiovascular: Normal rate, regular rhythm and normal heart sounds.  Pulmonary/Chest: Effort normal and breath sounds normal. No respiratory distress.  Musculoskeletal:       Lumbar back: He exhibits decreased range of motion, deformity, pain and spasm.       Back:  Skin: Skin is warm and dry.  Psychiatric: He has a normal mood and affect. His behavior is normal.  Nursing note and vitals reviewed.       Assessment & Plan:   1. DDD (degenerative disc disease), lumbar - HYDROmorphone (DILAUDID) 4 MG tablet; Take 1 tablet (4 mg total) by mouth every 4 (four) hours as needed for severe pain.  Dispense: 84 tablet; Refill: 0  2. DDD (degenerative disc disease), lumbosacral  3. Cellulitis of face - clindamycin (CLEOCIN) 150 MG capsule; Take 1 capsule (150 mg total) by mouth 3 (three) times daily.  Dispense: 30 capsule; Refill: 2   Continue all other maintenance medications as listed above.  Follow up plan: Return in about 2 weeks (around 11/07/2017) for recheck.  Educational handout given for survey   Remus LofflerAngel S. Oluwasemilore Pascuzzi PA-C [facility] [address]  [place], [place] [address] [phone]   10/27/2017, 9:22 AM

## 2017-10-30 ENCOUNTER — Encounter: Payer: Self-pay | Admitting: Physician Assistant

## 2017-10-31 ENCOUNTER — Other Ambulatory Visit: Payer: Self-pay | Admitting: Physician Assistant

## 2017-10-31 MED ORDER — MORPHINE SULFATE 30 MG PO TABS: 30.0000 mg | ORAL_TABLET | ORAL | 0 refills | Status: DC | PRN

## 2017-11-03 ENCOUNTER — Other Ambulatory Visit: Payer: Self-pay | Admitting: Physician Assistant

## 2017-11-03 MED ORDER — OXYCODONE HCL ER 40 MG PO T12A: 40.0000 mg | EXTENDED_RELEASE_TABLET | Freq: Two times a day (BID) | ORAL | 0 refills | Status: DC

## 2017-11-06 ENCOUNTER — Encounter: Payer: Self-pay | Admitting: Physician Assistant

## 2017-11-07 ENCOUNTER — Ambulatory Visit (INDEPENDENT_AMBULATORY_CARE_PROVIDER_SITE_OTHER): Payer: Commercial Managed Care - PPO | Admitting: Physician Assistant

## 2017-11-07 ENCOUNTER — Encounter: Payer: Self-pay | Admitting: Physician Assistant

## 2017-11-07 DIAGNOSIS — M5136 Other intervertebral disc degeneration, lumbar region: Secondary | ICD-10-CM | POA: Diagnosis not present

## 2017-11-07 DIAGNOSIS — M48061 Spinal stenosis, lumbar region without neurogenic claudication: Secondary | ICD-10-CM | POA: Diagnosis not present

## 2017-11-07 MED ORDER — METHOCARBAMOL 500 MG PO TABS: 500.0000 mg | ORAL_TABLET | Freq: Four times a day (QID) | ORAL | 3 refills | Status: DC | PRN

## 2017-11-07 MED ORDER — OXYCODONE-ACETAMINOPHEN 10-325 MG PO TABS: 1.0000 | ORAL_TABLET | Freq: Four times a day (QID) | ORAL | 0 refills | Status: DC | PRN

## 2017-11-07 MED ORDER — OXYCODONE HCL ER 80 MG PO T12A: 80.0000 mg | EXTENDED_RELEASE_TABLET | Freq: Two times a day (BID) | ORAL | 0 refills | Status: DC

## 2017-11-07 NOTE — Progress Notes (Signed)
BP 126/74   Pulse 84   Temp 97.9 F (36.6 C) (Oral)   Ht 5\' 10"  (1.778 m)   Wt 216 lb 6.4 oz (98.2 kg)   BMI 31.05 kg/m    Subjective:    Patient ID: John Ware, male    DOB: 03-16-1966, 52 y.o.   MRN: [id number]  HPI: John Ware is a 52 y.o. male presenting on 11/07/2017 for Follow-up (3 motnth )  Chronic pain is under control, patient has no new complaints. Medications are keeping things stable. Needs refills for the next three months.  [place] Controlled Substance website checked and normal. Drug screen normal this year. He had a major spinal surgery approximately 3 weeks ago.  He has seen his surgeon once.  He will go back in a few more weeks. Things seem to be slightly improving at this point. Past Medical History:  Diagnosis Date  . Depression   . Diabetes mellitus without complication (HCC)   . Hypertension   . Spinal stenosis    With Neurogenic Claudication   Relevant past medical, surgical, family and social history reviewed and updated as indicated. Interim medical history since our last visit reviewed. Allergies and medications reviewed and updated. DATA REVIEWED: CHART IN EPIC  Family History reviewed for pertinent findings.  Review of Systems  Constitutional: Negative.  Negative for appetite change and fatigue.  Eyes: Negative for pain and visual disturbance.  Respiratory: Negative.  Negative for cough, chest tightness, shortness of breath and wheezing.   Cardiovascular: Negative.  Negative for chest pain, palpitations and leg swelling.  Gastrointestinal: Negative.  Negative for abdominal pain, diarrhea, nausea and vomiting.  Genitourinary: Negative.   Skin: Negative.  Negative for color change and rash.  Neurological: Negative.  Negative for weakness, numbness and headaches.  Psychiatric/Behavioral: Negative.     Allergies as of 11/07/2017      Reactions   Tizanidine    Leg cramps      Medication List        Accurate as of 11/07/17 11:59  PM. Always use your most recent med list.          amLODipine 5 MG tablet Commonly known as:  NORVASC Take 1 tablet (5 mg total) by mouth daily.   buPROPion 150 MG 24 hr tablet Commonly known as:  WELLBUTRIN XL TAKE  (1)  TABLET TWICE A DAY.   clindamycin 150 MG capsule Commonly known as:  CLEOCIN Take 1 capsule (150 mg total) by mouth 3 (three) times daily.   diazepam 5 MG tablet Commonly known as:  VALIUM Take 1 tablet (5 mg total) by mouth every 6 (six) hours as needed for anxiety.   DULoxetine 30 MG capsule Commonly known as:  CYMBALTA Take 3 capsules (90 mg total) by mouth daily.   gabapentin 800 MG tablet Commonly known as:  NEURONTIN TAKE  (1)  TABLET  THREE TIMES DAILY.   guaiFENesin 600 MG 12 hr tablet Commonly known as:  MUCINEX Take 600 mg by mouth 2 (two) times daily as needed (congestion).   metFORMIN 500 MG tablet Commonly known as:  GLUCOPHAGE TAKE  (1)  TABLET  THREE TIMES DAILY WITH MEALS.   methocarbamol 500 MG tablet Commonly known as:  ROBAXIN Take 1 tablet (500 mg total) by mouth every 6 (six) hours as needed for muscle spasms.   naproxen sodium 220 MG tablet Commonly known as:  ALEVE Take 220 mg by mouth daily as needed (pain / headaches).  oxyCODONE 80 mg 12 hr tablet Commonly known as:  OXYCONTIN Take 1 tablet (80 mg total) by mouth every 12 (twelve) hours.   oxyCODONE-acetaminophen 10-325 MG tablet Commonly known as:  PERCOCET Take 1-2 tablets by mouth every 6 (six) hours as needed for pain.   oxyCODONE-acetaminophen 10-325 MG tablet Commonly known as:  PERCOCET Take 1-2 tablets by mouth every 6 (six) hours as needed for pain.   oxyCODONE-acetaminophen 10-325 MG tablet Commonly known as:  PERCOCET Take 1-2 tablets by mouth every 6 (six) hours as needed for pain.   ramipril 10 MG capsule Commonly known as:  ALTACE Take 1 capsule (10 mg total) by mouth daily.          Objective:    BP 126/74   Pulse 84   Temp 97.9 F  (36.6 C) (Oral)   Ht 5\' 10"  (1.778 m)   Wt 216 lb 6.4 oz (98.2 kg)   BMI 31.05 kg/m   Allergies  Allergen Reactions  . Tizanidine     Leg cramps    Wt Readings from Last 3 Encounters:  11/07/17 216 lb 6.4 oz (98.2 kg)  10/24/17 218 lb (98.9 kg)  10/14/17 218 lb (98.9 kg)    Physical Exam  Constitutional: He appears well-developed and well-nourished. No distress.  HENT:  Head: Normocephalic and atraumatic.  Eyes: Conjunctivae and EOM are normal. Pupils are equal, round, and reactive to light.  Cardiovascular: Normal rate, regular rhythm and normal heart sounds.  Pulmonary/Chest: Effort normal and breath sounds normal. No respiratory distress.  Skin: Skin is warm and dry.  Psychiatric: He has a normal mood and affect. His behavior is normal.  Nursing note and vitals reviewed.       Assessment & Plan:   1. DDD (degenerative disc disease), lumbar - oxyCODONE-acetaminophen (PERCOCET) 10-325 MG tablet; Take 1-2 tablets by mouth every 6 (six) hours as needed for pain.  Dispense: 240 tablet; Refill: 0 - oxyCODONE-acetaminophen (PERCOCET) 10-325 MG tablet; Take 1-2 tablets by mouth every 6 (six) hours as needed for pain.  Dispense: 240 tablet; Refill: 0 - oxyCODONE-acetaminophen (PERCOCET) 10-325 MG tablet; Take 1-2 tablets by mouth every 6 (six) hours as needed for pain.  Dispense: 240 tablet; Refill: 0 - oxyCODONE (OXYCONTIN) 80 mg 12 hr tablet; Take 1 tablet (80 mg total) by mouth every 12 (twelve) hours.  Dispense: 14 tablet; Refill: 0  2. Spinal stenosis of lumbar region, unspecified whether neurogenic claudication present - oxyCODONE-acetaminophen (PERCOCET) 10-325 MG tablet; Take 1-2 tablets by mouth every 6 (six) hours as needed for pain.  Dispense: 240 tablet; Refill: 0 - oxyCODONE-acetaminophen (PERCOCET) 10-325 MG tablet; Take 1-2 tablets by mouth every 6 (six) hours as needed for pain.  Dispense: 240 tablet; Refill: 0 - oxyCODONE-acetaminophen (PERCOCET) 10-325 MG tablet;  Take 1-2 tablets by mouth every 6 (six) hours as needed for pain.  Dispense: 240 tablet; Refill: 0 - methocarbamol (ROBAXIN) 500 MG tablet; Take 1 tablet (500 mg total) by mouth every 6 (six) hours as needed for muscle spasms.  Dispense: 90 tablet; Refill: 3   Continue all other maintenance medications as listed above.  Follow up plan: Return in about 3 months (around 02/07/2018) for recheck.  Educational handout given for survey  Remus LofflerAngel S. Hamad Whyte PA-C [facility] [address]  [place], [place] [id number] [phone]   11/09/2017, 9:08 PM

## 2017-11-07 NOTE — Patient Instructions (Signed)
In a few days you may receive a survey in the mail or online from [employer] regarding your visit with us today. Please take a moment to fill this out. Your feedback is very important to our whole office. It can help us better understand your needs as well as improve your experience and satisfaction. Thank you for taking your time to complete it. We care about you.  Raymundo Rout, PA-C  

## 2017-11-11 ENCOUNTER — Telehealth: Payer: Self-pay

## 2017-11-11 ENCOUNTER — Other Ambulatory Visit: Payer: Self-pay | Admitting: Physician Assistant

## 2017-11-11 NOTE — Telephone Encounter (Signed)
Insurance denied Oxycodone 80 mg

## 2017-11-24 ENCOUNTER — Encounter: Payer: Self-pay | Admitting: Physician Assistant

## 2017-11-24 ENCOUNTER — Ambulatory Visit (INDEPENDENT_AMBULATORY_CARE_PROVIDER_SITE_OTHER): Payer: Commercial Managed Care - PPO | Admitting: Physician Assistant

## 2017-11-24 VITALS — BP 135/90 | HR 96 | Temp 97.5°F | Ht 70.0 in | Wt 205.0 lb

## 2017-11-24 DIAGNOSIS — M48061 Spinal stenosis, lumbar region without neurogenic claudication: Secondary | ICD-10-CM | POA: Diagnosis not present

## 2017-11-24 DIAGNOSIS — M5137 Other intervertebral disc degeneration, lumbosacral region: Secondary | ICD-10-CM

## 2017-11-24 MED ORDER — METHYLPREDNISOLONE ACETATE 80 MG/ML IJ SUSP
80.0000 mg | Freq: Once | INTRAMUSCULAR | Status: AC
  Administered 2017-11-24: 80 mg via INTRAMUSCULAR

## 2017-11-24 MED ORDER — METHOCARBAMOL 500 MG PO TABS: 500.0000 mg | ORAL_TABLET | Freq: Four times a day (QID) | ORAL | 3 refills | Status: DC | PRN

## 2017-11-24 NOTE — Progress Notes (Signed)
BP 135/90   Pulse 96   Temp (!) 97.5 F (36.4 C) (Oral)   Ht 5\' 10"  (1.778 m)   Wt 205 lb (93 kg)   BMI 29.41 kg/m    Subjective:    Patient ID: John Ware, male    DOB: 04-25-1966, 52 y.o.   MRN: [id number]  HPI: John Ware is a 52 y.o. male presenting on 11/24/2017 for Back Pain (back surg 6 weeks ago - now having right side buttock pain x 4-5 days- right side worse)  Patient comes in for recheck on his back.  He had surgery performed 6 weeks ago with [name].  Several days ago he was having to take something out of his home though he did not plan to have to do it he did very slowly and carefully but has been very tight in his right low back ever since.  He had been doing better and only having to take the Robaxin once or twice a day.  We have discussed that he can increase this back to 4 times a day and will try some anti-inflammatory at this time.  He already has pain medication.  He goes back April 24 to see [name].  It is time for a new authorization for his oxycodone 10 325 mg tablets.  His optimum [facility] had approved it through October 30, 2017.  We will check with pharmacy to see if authorization is needed this year.  Past Medical History:  Diagnosis Date  . Depression   . Diabetes mellitus without complication (HCC)   . Hypertension   . Spinal stenosis    With Neurogenic Claudication   Relevant past medical, surgical, family and social history reviewed and updated as indicated. Interim medical history since our last visit reviewed. Allergies and medications reviewed and updated. DATA REVIEWED: CHART IN EPIC  Family History reviewed for pertinent findings.  Review of Systems  Constitutional: Negative.  Negative for appetite change and fatigue.  Eyes: Negative for pain and visual disturbance.  Respiratory: Negative.  Negative for cough, chest tightness, shortness of breath and wheezing.   Cardiovascular: Negative.  Negative for chest pain, palpitations  and leg swelling.  Gastrointestinal: Negative.  Negative for abdominal pain, diarrhea, nausea and vomiting.  Genitourinary: Negative.   Musculoskeletal: Positive for arthralgias, back pain, gait problem and myalgias.  Skin: Negative.  Negative for color change and rash.  Neurological: Negative for weakness, numbness and headaches.  Psychiatric/Behavioral: Negative.     Allergies as of 11/24/2017      Reactions   Tizanidine    Leg cramps      Medication List        Accurate as of 11/24/17  2:07 PM. Always use your most recent med list.          amLODipine 5 MG tablet Commonly known as:  NORVASC Take 1 tablet (5 mg total) by mouth daily.   buPROPion 150 MG 24 hr tablet Commonly known as:  WELLBUTRIN XL TAKE  (1)  TABLET TWICE A DAY.   diazepam 5 MG tablet Commonly known as:  VALIUM Take 1 tablet (5 mg total) by mouth every 6 (six) hours as needed for anxiety.   DULoxetine 30 MG capsule Commonly known as:  CYMBALTA Take 3 capsules (90 mg total) by mouth daily.   gabapentin 800 MG tablet Commonly known as:  NEURONTIN TAKE  (1)  TABLET  THREE TIMES DAILY.   guaiFENesin 600 MG 12 hr tablet  Commonly known as:  MUCINEX Take 600 mg by mouth 2 (two) times daily as needed (congestion).   metFORMIN 500 MG tablet Commonly known as:  GLUCOPHAGE TAKE (1) TABLET THREE TIMES DAILY WITH MEALS.   methocarbamol 500 MG tablet Commonly known as:  ROBAXIN Take 1 tablet (500 mg total) by mouth every 6 (six) hours as needed for muscle spasms.   naproxen sodium 220 MG tablet Commonly known as:  ALEVE Take 220 mg by mouth daily as needed (pain / headaches).   oxyCODONE-acetaminophen 10-325 MG tablet Commonly known as:  PERCOCET Take 1-2 tablets by mouth every 6 (six) hours as needed for pain.   oxyCODONE-acetaminophen 10-325 MG tablet Commonly known as:  PERCOCET Take 1-2 tablets by mouth every 6 (six) hours as needed for pain.   ramipril 10 MG capsule Commonly known as:   ALTACE Take 1 capsule (10 mg total) by mouth daily.          Objective:    BP 135/90   Pulse 96   Temp (!) 97.5 F (36.4 C) (Oral)   Ht 5\' 10"  (1.778 m)   Wt 205 lb (93 kg)   BMI 29.41 kg/m   Allergies  Allergen Reactions  . Tizanidine     Leg cramps    Wt Readings from Last 3 Encounters:  11/24/17 205 lb (93 kg)  11/07/17 216 lb 6.4 oz (98.2 kg)  10/24/17 218 lb (98.9 kg)    Physical Exam  Constitutional: He appears well-developed and well-nourished. No distress.  HENT:  Head: Normocephalic and atraumatic.  Eyes: Pupils are equal, round, and reactive to light. Conjunctivae and EOM are normal.  Cardiovascular: Normal rate, regular rhythm and normal heart sounds.  Pulmonary/Chest: Effort normal and breath sounds normal. No respiratory distress.  Musculoskeletal:       Lumbar back: He exhibits decreased range of motion, swelling, pain and spasm.       Back:  Negative straight leg raise, equal strength and reflexes in lower legs  Skin: Skin is warm and dry.  Psychiatric: He has a normal mood and affect. His behavior is normal.  Nursing note and vitals reviewed.       Assessment & Plan:   1. DDD (degenerative disc disease), lumbosacral - methylPREDNISolone acetate (DEPO-MEDROL) injection 80 mg  2. Spinal stenosis of lumbar region, unspecified whether neurogenic claudication present - methocarbamol (ROBAXIN) 500 MG tablet; Take 1 tablet (500 mg total) by mouth every 6 (six) hours as needed for muscle spasms.  Dispense: 120 tablet; Refill: 3   Continue all other maintenance medications as listed above.  Follow up plan: No follow-ups on file.  Educational handout given for note   Remus LofflerAngel S. Desman Polak PA-C [facility] [address]  [place], [place] [id number] [phone]   11/24/2017, 2:07 PM

## 2017-12-04 ENCOUNTER — Other Ambulatory Visit: Payer: Self-pay | Admitting: Family Medicine

## 2017-12-04 NOTE — Telephone Encounter (Signed)
Last seen 11/24/17  John Ware

## 2018-02-04 ENCOUNTER — Other Ambulatory Visit: Payer: Self-pay | Admitting: Physician Assistant

## 2018-02-09 ENCOUNTER — Other Ambulatory Visit: Payer: Self-pay | Admitting: Physician Assistant

## 2018-02-10 ENCOUNTER — Encounter: Payer: Self-pay | Admitting: Physician Assistant

## 2018-02-10 ENCOUNTER — Ambulatory Visit (INDEPENDENT_AMBULATORY_CARE_PROVIDER_SITE_OTHER): Payer: Commercial Managed Care - PPO | Admitting: Physician Assistant

## 2018-02-10 VITALS — BP 129/78 | HR 83 | Temp 98.3°F | Ht 70.0 in | Wt 217.8 lb

## 2018-02-10 DIAGNOSIS — E1169 Type 2 diabetes mellitus with other specified complication: Secondary | ICD-10-CM | POA: Diagnosis not present

## 2018-02-10 DIAGNOSIS — M48061 Spinal stenosis, lumbar region without neurogenic claudication: Secondary | ICD-10-CM | POA: Diagnosis not present

## 2018-02-10 DIAGNOSIS — R109 Unspecified abdominal pain: Secondary | ICD-10-CM

## 2018-02-10 DIAGNOSIS — R809 Proteinuria, unspecified: Secondary | ICD-10-CM | POA: Diagnosis not present

## 2018-02-10 DIAGNOSIS — M5136 Other intervertebral disc degeneration, lumbar region: Secondary | ICD-10-CM

## 2018-02-10 DIAGNOSIS — Z125 Encounter for screening for malignant neoplasm of prostate: Secondary | ICD-10-CM

## 2018-02-10 DIAGNOSIS — E785 Hyperlipidemia, unspecified: Secondary | ICD-10-CM

## 2018-02-10 DIAGNOSIS — E1029 Type 1 diabetes mellitus with other diabetic kidney complication: Secondary | ICD-10-CM | POA: Diagnosis not present

## 2018-02-10 DIAGNOSIS — R197 Diarrhea, unspecified: Secondary | ICD-10-CM

## 2018-02-10 LAB — BAYER DCA HB A1C WAIVED: HB A1C (BAYER DCA - WAIVED): 7.6 % — ABNORMAL HIGH (ref <7.0)

## 2018-02-10 MED ORDER — OXYCODONE-ACETAMINOPHEN 10-325 MG PO TABS: 1.0000 | ORAL_TABLET | Freq: Four times a day (QID) | ORAL | 0 refills | Status: DC | PRN

## 2018-02-10 NOTE — Progress Notes (Signed)
BP 129/78   Pulse 83   Temp 98.3 F (36.8 C) (Oral)   Ht '5\' 10"'  (1.778 m)   Wt 217 lb 12.8 oz (98.8 kg)   BMI 31.25 kg/m    Subjective:    Patient ID: John Ware, male    DOB: October 07, 1965, 52 y.o.   MRN: [id number]  HPI: John Ware is a 52 y.o. male presenting on 02/10/2018 for Diabetes and Hyperlipidemia  This patient comes in for periodic recheck on medications and conditions including back pain and DDD. He has returned to work, one month sooner than the neurosurgeon desired. 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  He does report doing much better after having had his back surgery.  And the neurosurgeon is impressed with his improvement.  Recommend decrease his pain medication at this time and plan to recheck him back in a couple of months and further reduce  Having occasional abdominal pain after eating beef. And pork. He has know history of tick bite. Denies swelling or allergic symptoms..   All medications are reviewed today. There are no reports of any problems with the medications. All of the medical conditions are reviewed and updated.  Lab work is reviewed and will be ordered as medically necessary. There are no new problems reported with today's visit.   Past Medical History:  Diagnosis Date  . Depression   . Diabetes mellitus without complication ([place])   . Hypertension   . Spinal stenosis    With Neurogenic Claudication   Relevant past medical, surgical, family and social history reviewed and updated as indicated. Interim medical history since our last visit reviewed. Allergies and medications reviewed and updated. DATA REVIEWED: CHART IN EPIC  Family History reviewed for pertinent findings.  Review of Systems  Constitutional: Negative.  Negative for appetite change and fatigue.  Eyes: Negative for pain and visual disturbance.  Respiratory: Negative.  Negative for cough, chest tightness, shortness of breath and wheezing.   Cardiovascular: Negative.   Negative for chest pain, palpitations and leg swelling.  Gastrointestinal: Positive for diarrhea. Negative for abdominal pain, nausea and vomiting.  Genitourinary: Negative.   Musculoskeletal: Positive for arthralgias, back pain, joint swelling and myalgias.  Skin: Negative.  Negative for color change and rash.  Neurological: Negative.  Negative for weakness, numbness and headaches.  Psychiatric/Behavioral: Negative.     Allergies as of 02/10/2018      Reactions   Tizanidine    Leg cramps      Medication List        Accurate as of 02/10/18  1:29 PM. Always use your most recent med list.          amLODipine 5 MG tablet Commonly known as:  NORVASC Take 1 tablet (5 mg total) by mouth daily.   baclofen 10 MG tablet Commonly known as:  LIORESAL TAKE (1) TABLET TWICE A DAY.   buPROPion 150 MG 24 hr tablet Commonly known as:  WELLBUTRIN XL TAKE  (1)  TABLET TWICE A DAY.   diazepam 5 MG tablet Commonly known as:  VALIUM Take 1 tablet (5 mg total) by mouth every 6 (six) hours as needed for anxiety.   gabapentin 800 MG tablet Commonly known as:  NEURONTIN TAKE  (1)  TABLET  THREE TIMES DAILY.   guaiFENesin 600 MG 12 hr tablet Commonly known as:  MUCINEX Take 600 mg by mouth 2 (two) times daily as needed (congestion).   metFORMIN 500 MG tablet Commonly known as:  GLUCOPHAGE TAKE (1) TABLET THREE TIMES DAILY WITH MEALS.   methocarbamol 500 MG tablet Commonly known as:  ROBAXIN Take 1 tablet (500 mg total) by mouth every 6 (six) hours as needed for muscle spasms.   naproxen sodium 220 MG tablet Commonly known as:  ALEVE Take 220 mg by mouth daily as needed (pain / headaches).   oxyCODONE-acetaminophen 10-325 MG tablet Commonly known as:  PERCOCET Take 1-2 tablets by mouth every 6 (six) hours as needed for pain.   oxyCODONE-acetaminophen 10-325 MG tablet Commonly known as:  PERCOCET Take 1-2 tablets by mouth every 6 (six) hours as needed for pain.     oxyCODONE-acetaminophen 10-325 MG tablet Commonly known as:  PERCOCET Take 1-2 tablets by mouth every 6 (six) hours as needed for pain.   ramipril 10 MG capsule Commonly known as:  ALTACE TAKE (1) CAPSULE DAILY          Objective:    BP 129/78   Pulse 83   Temp 98.3 F (36.8 C) (Oral)   Ht '5\' 10"'  (1.778 m)   Wt 217 lb 12.8 oz (98.8 kg)   BMI 31.25 kg/m   Allergies  Allergen Reactions  . Tizanidine     Leg cramps    Wt Readings from Last 3 Encounters:  02/10/18 217 lb 12.8 oz (98.8 kg)  11/24/17 205 lb (93 kg)  11/07/17 216 lb 6.4 oz (98.2 kg)    Physical Exam  Constitutional: He appears well-developed and well-nourished.  HENT:  Head: Normocephalic and atraumatic.  Eyes: Pupils are equal, round, and reactive to light. Conjunctivae and EOM are normal.  Neck: Normal range of motion. Neck supple.  Cardiovascular: Normal rate, regular rhythm and normal heart sounds.  Pulmonary/Chest: Effort normal and breath sounds normal.  Abdominal: Soft. Bowel sounds are normal.  Musculoskeletal: Normal range of motion.  Skin: Skin is warm and dry.        Assessment & Plan:   1. Hyperlipidemia associated with type 2 diabetes mellitus (HCC) - CMP14+EGFR - Lipid panel  2. 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 Screening for prostate cancer - PSA  4. Abdominal pain, unspecified abdominal location - Alpha-Gal Panel  5. Diarrhea, unspecified type - Alpha-Gal Panel  6. DDD (degenerative disc disease), lumbar - oxyCODONE-acetaminophen (PERCOCET) 10-325 MG tablet; Take 1-2 tablets by mouth every 6 (six) hours as needed for pain.  Dispense: 180 tablet; Refill: 0 - oxyCODONE-acetaminophen (PERCOCET) 10-325 MG tablet; Take 1-2 tablets by mouth every 6 (six) hours as needed for pain.  Dispense: 180 tablet; Refill: 0 - oxyCODONE-acetaminophen (PERCOCET) 10-325 MG tablet; Take 1-2 tablets by mouth every 6 (six) hours as needed for  pain.  Dispense: 180 tablet; Refill: 0  7. Spinal stenosis of lumbar region, unspecified whether neurogenic claudication present - oxyCODONE-acetaminophen (PERCOCET) 10-325 MG tablet; Take 1-2 tablets by mouth every 6 (six) hours as needed for pain.  Dispense: 180 tablet; Refill: 0 - oxyCODONE-acetaminophen (PERCOCET) 10-325 MG tablet; Take 1-2 tablets by mouth every 6 (six) hours as needed for pain.  Dispense: 180 tablet; Refill: 0 - oxyCODONE-acetaminophen (PERCOCET) 10-325 MG tablet; Take 1-2 tablets by mouth every 6 (six) hours as needed for pain.  Dispense: 180 tablet; Refill: 0   Continue all other maintenance medications as listed above.  Follow up plan: Return in about 3 months (around 05/13/2018) for recheck.  Educational handout given for [place] PA-C [place]  [place], [place] [address] [phone]   02/10/2018, 1:29 PM

## 2018-02-13 LAB — CMP14+EGFR
ALT: 28 IU/L (ref 0–44)
AST: 25 IU/L (ref 0–40)
Albumin/Globulin Ratio: 1.8 (ref 1.2–2.2)
Albumin: 4.8 g/dL (ref 3.5–5.5)
Alkaline Phosphatase: 130 IU/L — ABNORMAL HIGH (ref 39–117)
BUN/Creatinine Ratio: 14 (ref 9–20)
BUN: 11 mg/dL (ref 6–24)
Bilirubin Total: 0.5 mg/dL (ref 0.0–1.2)
CO2: 25 mmol/L (ref 20–29)
Calcium: 10.5 mg/dL — ABNORMAL HIGH (ref 8.7–10.2)
Chloride: 96 mmol/L (ref 96–106)
Creatinine, Ser: 0.78 mg/dL (ref 0.76–1.27)
GFR calc Af Amer: 122 mL/min/{1.73_m2} (ref >59)
GFR calc non Af Amer: 105 mL/min/{1.73_m2} (ref >59)
Globulin, Total: 2.6 g/dL (ref 1.5–4.5)
Glucose: 131 mg/dL — ABNORMAL HIGH (ref 65–99)
Potassium: 5 mmol/L (ref 3.5–5.2)
Sodium: 139 mmol/L (ref 134–144)
Total Protein: 7.4 g/dL (ref 6.0–8.5)

## 2018-02-13 LAB — ALPHA-GAL PANEL
Alpha Gal IgE*: 0.93 kU/L — ABNORMAL HIGH (ref <0.10)
Beef (Bos spp) IgE: 0.3 kU/L (ref <0.35)
Class Interpretation: 0
Lamb/Mutton (Ovis spp) IgE: <0.1 kU/L (ref <0.35)
Pork (Sus spp) IgE: 0.14 kU/L (ref <0.35)

## 2018-02-13 LAB — LIPID PANEL
Chol/HDL Ratio: 2.3 ratio (ref 0.0–5.0)
Cholesterol, Total: 182 mg/dL (ref 100–199)
HDL: 78 mg/dL (ref >39)
LDL Calculated: 64 mg/dL (ref 0–99)
Triglycerides: 202 mg/dL — ABNORMAL HIGH (ref 0–149)
VLDL Cholesterol Cal: 40 mg/dL (ref 5–40)

## 2018-02-13 LAB — PSA: Prostate Specific Ag, Serum: 0.2 ng/mL (ref 0.0–4.0)

## 2018-03-10 ENCOUNTER — Other Ambulatory Visit: Payer: Self-pay | Admitting: Physician Assistant

## 2018-04-09 ENCOUNTER — Other Ambulatory Visit: Payer: Self-pay | Admitting: Physician Assistant

## 2018-05-11 ENCOUNTER — Other Ambulatory Visit: Payer: Self-pay | Admitting: Physician Assistant

## 2018-05-12 NOTE — Telephone Encounter (Signed)
Last seen 02/10/18  Lawanna KobusAngel

## 2018-05-15 ENCOUNTER — Ambulatory Visit (INDEPENDENT_AMBULATORY_CARE_PROVIDER_SITE_OTHER): Payer: Commercial Managed Care - PPO | Admitting: Physician Assistant

## 2018-05-15 ENCOUNTER — Encounter: Payer: Self-pay | Admitting: Physician Assistant

## 2018-05-15 DIAGNOSIS — M48061 Spinal stenosis, lumbar region without neurogenic claudication: Secondary | ICD-10-CM | POA: Diagnosis not present

## 2018-05-15 DIAGNOSIS — M5136 Other intervertebral disc degeneration, lumbar region: Secondary | ICD-10-CM

## 2018-05-15 MED ORDER — OXYCODONE-ACETAMINOPHEN 10-325 MG PO TABS: 1.0000 | ORAL_TABLET | Freq: Four times a day (QID) | ORAL | 0 refills | Status: DC | PRN

## 2018-05-15 MED ORDER — RISPERIDONE 0.5 MG PO TABS: 0.5000 mg | ORAL_TABLET | Freq: Every day | ORAL | 5 refills | Status: DC

## 2018-05-15 MED ORDER — METFORMIN HCL 500 MG PO TABS: 500.0000 mg | ORAL_TABLET | Freq: Three times a day (TID) | ORAL | 11 refills | Status: DC

## 2018-05-15 MED ORDER — RAMIPRIL 10 MG PO CAPS: ORAL_CAPSULE | ORAL | 3 refills | Status: DC

## 2018-05-15 MED ORDER — DULOXETINE HCL 30 MG PO CPEP: 90.0000 mg | ORAL_CAPSULE | Freq: Every day | ORAL | 3 refills | Status: DC

## 2018-05-18 NOTE — Progress Notes (Signed)
BP 139/89   Pulse 87   Temp 98 F (36.7 C) (Oral)   Ht 5\' 10"  (1.778 m)   Wt 217 lb 6.4 oz (98.6 kg)   BMI 31.19 kg/m    Subjective:    Patient ID: John Ware, male    DOB: September 14, 1965, 52 y.o.   MRN: [id number]  HPI: John Ware is a 52 y.o. male presenting on 05/15/2018 for Hyperlipidemia (3 month ); Diabetes; and Pain  This patient comes in for 110-month recheck on his chronic medical conditions.  We currently have him on pain medication related to his severe degenerative disc disease.  He has had multiple surgeries on his back.  Most recently was this year.  He has continued to use medication and hopes to be able to reduce some of it.  He was able to retire from his job about a week ago.  He does plan on going back to work but with a lesser physically demanding job.  He reports that his sugars seem to be doing well and he has no other complaints at this time.  Pain assessment: Cause of pain- DDD Pain on scale of 1-10- 7 Frequency-daily What increases pain-standing, lifting What makes pain Better-rest Effects on ADL - mild Any change in general medical condition-mild  Current medications- percocet 1-2 QID for pain Effectiveness of current meds-good Adverse reactions form pain meds-none  Pill count performed-No Urine drug screen- No Was the [facility] reviewed- yes  If yes were their any concerning findings? - no   Past Medical History:  Diagnosis Date  . Depression   . Diabetes mellitus without complication (HCC)   . Hypertension   . Spinal stenosis    With Neurogenic Claudication   Relevant past medical, surgical, family and social history reviewed and updated as indicated. Interim medical history since our last visit reviewed. Allergies and medications reviewed and updated. DATA REVIEWED: CHART IN EPIC  Family History reviewed for pertinent findings.  Review of Systems  Constitutional: Negative.  Negative for appetite change and fatigue.  Eyes: Negative  for pain and visual disturbance.  Respiratory: Negative.  Negative for cough, chest tightness, shortness of breath and wheezing.   Cardiovascular: Negative.  Negative for chest pain, palpitations and leg swelling.  Gastrointestinal: Negative.  Negative for abdominal pain, diarrhea, nausea and vomiting.  Genitourinary: Negative.   Musculoskeletal: Positive for arthralgias, back pain and myalgias.  Skin: Negative.  Negative for color change and rash.  Neurological: Negative.  Negative for weakness, numbness and headaches.  Psychiatric/Behavioral: Negative.     Allergies as of 05/15/2018      Reactions   Tizanidine    Leg cramps      Medication List        Accurate as of 05/15/18 11:59 PM. Always use your most recent med list.          amLODipine 5 MG tablet Commonly known as:  NORVASC Take 1 tablet (5 mg total) by mouth daily.   baclofen 10 MG tablet Commonly known as:  LIORESAL TAKE (1) TABLET TWICE A DAY.   buPROPion 150 MG 24 hr tablet Commonly known as:  WELLBUTRIN XL TAKE (1) TABLET TWICE A DAY.   diazepam 5 MG tablet Commonly known as:  VALIUM Take 1 tablet (5 mg total) by mouth every 6 (six) hours as needed for anxiety.   DULoxetine 30 MG capsule Commonly known as:  CYMBALTA Take 3 capsules (90 mg total) by mouth daily.   gabapentin  800 MG tablet Commonly known as:  NEURONTIN TAKE  (1)  TABLET  THREE TIMES DAILY.   guaiFENesin 600 MG 12 hr tablet Commonly known as:  MUCINEX Take 600 mg by mouth 2 (two) times daily as needed (congestion).   metFORMIN 500 MG tablet Commonly known as:  GLUCOPHAGE Take 1 tablet (500 mg total) by mouth 3 (three) times daily.   methocarbamol 500 MG tablet Commonly known as:  ROBAXIN Take 1 tablet (500 mg total) by mouth every 6 (six) hours as needed for muscle spasms.   naproxen sodium 220 MG tablet Commonly known as:  ALEVE Take 220 mg by mouth daily as needed (pain / headaches).   oxyCODONE-acetaminophen 10-325 MG  tablet Commonly known as:  PERCOCET Take 1-2 tablets by mouth every 6 (six) hours as needed for pain.   oxyCODONE-acetaminophen 10-325 MG tablet Commonly known as:  PERCOCET Take 1-2 tablets by mouth every 6 (six) hours as needed for pain.   oxyCODONE-acetaminophen 10-325 MG tablet Commonly known as:  PERCOCET Take 1-2 tablets by mouth every 6 (six) hours as needed for pain.   ramipril 10 MG capsule Commonly known as:  ALTACE TAKE (1) CAPSULE DAILY   risperiDONE 0.5 MG tablet Commonly known as:  RISPERDAL Take 1 tablet (0.5 mg total) by mouth at bedtime.          Objective:    BP 139/89   Pulse 87   Temp 98 F (36.7 C) (Oral)   Ht 5\' 10"  (1.778 m)   Wt 217 lb 6.4 oz (98.6 kg)   BMI 31.19 kg/m   Allergies  Allergen Reactions  . Tizanidine     Leg cramps    Wt Readings from Last 3 Encounters:  05/15/18 217 lb 6.4 oz (98.6 kg)  02/10/18 217 lb 12.8 oz (98.8 kg)  11/24/17 205 lb (93 kg)    Physical Exam  Constitutional: He appears well-developed and well-nourished. No distress.  HENT:  Head: Normocephalic and atraumatic.  Eyes: Pupils are equal, round, and reactive to light. Conjunctivae and EOM are normal.  Cardiovascular: Normal rate, regular rhythm and normal heart sounds.  Pulmonary/Chest: Effort normal and breath sounds normal. No respiratory distress.  Musculoskeletal:       Lumbar back: He exhibits decreased range of motion, tenderness, pain and spasm.  Neurological:  Reflex Scores:      Patellar reflexes are 1+ on the right side and 1+ on the left side. Skin: Skin is warm and dry.  Psychiatric: He has a normal mood and affect. His behavior is normal.  Nursing note and vitals reviewed.       Assessment & Plan:   1. DDD (degenerative disc disease), lumbar - oxyCODONE-acetaminophen (PERCOCET) 10-325 MG tablet; Take 1-2 tablets by mouth every 6 (six) hours as needed for pain.  Dispense: 180 tablet; Refill: 0 - oxyCODONE-acetaminophen (PERCOCET)  10-325 MG tablet; Take 1-2 tablets by mouth every 6 (six) hours as needed for pain.  Dispense: 180 tablet; Refill: 0 - oxyCODONE-acetaminophen (PERCOCET) 10-325 MG tablet; Take 1-2 tablets by mouth every 6 (six) hours as needed for pain.  Dispense: 180 tablet; Refill: 0  2. Spinal stenosis of lumbar region, unspecified whether neurogenic claudication present - oxyCODONE-acetaminophen (PERCOCET) 10-325 MG tablet; Take 1-2 tablets by mouth every 6 (six) hours as needed for pain.  Dispense: 180 tablet; Refill: 0 - oxyCODONE-acetaminophen (PERCOCET) 10-325 MG tablet; Take 1-2 tablets by mouth every 6 (six) hours as needed for pain.  Dispense: 180 tablet; Refill: 0 -  oxyCODONE-acetaminophen (PERCOCET) 10-325 MG tablet; Take 1-2 tablets by mouth every 6 (six) hours as needed for pain.  Dispense: 180 tablet; Refill: 0   Continue all other maintenance medications as listed above.  Follow up plan: Return in about 4 weeks (around 06/12/2018) for recheck.  Educational handout given for survey  Remus LofflerAngel S. Jamiya Nims PA-C [facility] [address]  [place], [place] [id number] [phone]   05/18/2018, 3:04 PM

## 2018-06-15 ENCOUNTER — Ambulatory Visit (INDEPENDENT_AMBULATORY_CARE_PROVIDER_SITE_OTHER): Payer: Commercial Managed Care - PPO | Admitting: Physician Assistant

## 2018-06-15 ENCOUNTER — Encounter: Payer: Self-pay | Admitting: Physician Assistant

## 2018-06-15 VITALS — BP 130/82 | HR 80 | Temp 97.6°F | Ht 70.0 in | Wt 224.0 lb

## 2018-06-15 DIAGNOSIS — Z23 Encounter for immunization: Secondary | ICD-10-CM

## 2018-06-15 DIAGNOSIS — R454 Irritability and anger: Secondary | ICD-10-CM

## 2018-06-15 MED ORDER — RISPERIDONE 2 MG PO TABS: 2.0000 mg | ORAL_TABLET | Freq: Every day | ORAL | 5 refills | Status: DC

## 2018-06-15 NOTE — Progress Notes (Signed)
BP 130/82   Pulse 80   Temp 97.6 F (36.4 C) (Oral)   Ht 5\' 10"  (1.778 m)   Wt 224 lb (101.6 kg)   BMI 32.14 kg/m    Subjective:    Patient ID: John Ware, male    DOB: [birth year], 52 y.o.   MRN: [id number]  HPI: John Ware is a 52 y.o. male presenting on 06/15/2018 for Medical Management of Chronic Issues (4 week follow up on risperdone )  This patient comes back in for one-month follow-up on the start of risperidone.  He started with 0.5 mg 1 daily for week then increase to 1 mg and the third week increase to 1.5 mg.  Says he cannot tell a difference with overall mood and agitation.  He has been fortunate enough to recently retire from a stressful job.  He is doing part-time work that he enjoys.  He denies any other significant issues at this time.   Past Medical History:  Diagnosis Date  . Depression   . Diabetes mellitus without complication (HCC)   . Hypertension   . Spinal stenosis    With Neurogenic Claudication   Relevant past medical, surgical, family and social history reviewed and updated as indicated. Interim medical history since our last visit reviewed. Allergies and medications reviewed and updated. DATA REVIEWED: CHART IN EPIC  Family History reviewed for pertinent findings.  Review of Systems  Constitutional: Negative.  Negative for appetite change and fatigue.  Eyes: Negative for pain and visual disturbance.  Respiratory: Negative.  Negative for cough, chest tightness, shortness of breath and wheezing.   Cardiovascular: Negative.  Negative for chest pain, palpitations and leg swelling.  Gastrointestinal: Negative.  Negative for abdominal pain, diarrhea, nausea and vomiting.  Genitourinary: Negative.   Skin: Negative.  Negative for color change and rash.  Neurological: Negative.  Negative for weakness, numbness and headaches.  Psychiatric/Behavioral: Negative.     Allergies as of 06/15/2018      Reactions   Tizanidine    Leg cramps        Medication List        Accurate as of 06/15/18  9:47 AM. Always use your most recent med list.          amLODipine 5 MG tablet Commonly known as:  NORVASC Take 1 tablet (5 mg total) by mouth daily.   baclofen 10 MG tablet Commonly known as:  LIORESAL TAKE (1) TABLET TWICE A DAY.   buPROPion 150 MG 24 hr tablet Commonly known as:  WELLBUTRIN XL TAKE (1) TABLET TWICE A DAY.   diazepam 5 MG tablet Commonly known as:  VALIUM Take 1 tablet (5 mg total) by mouth every 6 (six) hours as needed for anxiety.   DULoxetine 30 MG capsule Commonly known as:  CYMBALTA Take 3 capsules (90 mg total) by mouth daily.   gabapentin 800 MG tablet Commonly known as:  NEURONTIN TAKE  (1)  TABLET  THREE TIMES DAILY.   guaiFENesin 600 MG 12 hr tablet Commonly known as:  MUCINEX Take 600 mg by mouth 2 (two) times daily as needed (congestion).   metFORMIN 500 MG tablet Commonly known as:  GLUCOPHAGE Take 1 tablet (500 mg total) by mouth 3 (three) times daily.   methocarbamol 500 MG tablet Commonly known as:  ROBAXIN Take 1 tablet (500 mg total) by mouth every 6 (six) hours as needed for muscle spasms.   oxyCODONE-acetaminophen 10-325 MG tablet Commonly known as:  PERCOCET Take  1-2 tablets by mouth every 6 (six) hours as needed for pain.   oxyCODONE-acetaminophen 10-325 MG tablet Commonly known as:  PERCOCET Take 1-2 tablets by mouth every 6 (six) hours as needed for pain.   oxyCODONE-acetaminophen 10-325 MG tablet Commonly known as:  PERCOCET Take 1-2 tablets by mouth every 6 (six) hours as needed for pain.   ramipril 10 MG capsule Commonly known as:  ALTACE TAKE (1) CAPSULE DAILY   risperiDONE 2 MG tablet Commonly known as:  RISPERDAL Take 1 tablet (2 mg total) by mouth at bedtime.          Objective:    BP 130/82   Pulse 80   Temp 97.6 F (36.4 C) (Oral)   Ht 5\' 10"  (1.778 m)   Wt 224 lb (101.6 kg)   BMI 32.14 kg/m   Allergies  Allergen Reactions  .  Tizanidine     Leg cramps    Wt Readings from Last 3 Encounters:  06/15/18 224 lb (101.6 kg)  05/15/18 217 lb 6.4 oz (98.6 kg)  02/10/18 217 lb 12.8 oz (98.8 kg)    Physical Exam  Constitutional: He appears well-developed and well-nourished. No distress.  HENT:  Head: Normocephalic and atraumatic.  Eyes: Pupils are equal, round, and reactive to light. Conjunctivae and EOM are normal.  Cardiovascular: Normal rate, regular rhythm and normal heart sounds.  Pulmonary/Chest: Effort normal and breath sounds normal. No respiratory distress.  Skin: Skin is warm and dry.  Psychiatric: He has a normal mood and affect. His behavior is normal.  Nursing note and vitals reviewed.       Assessment & Plan:   1. Irritability and anger - risperiDONE (RISPERDAL) 2 MG tablet; Take 1 tablet (2 mg total) by mouth at bedtime.  Dispense: 30 tablet; Refill: 5  2. Need for immunization against influenza - Flu Vaccine QUAD 36+ mos IM   Continue all other maintenance medications as listed above.  Follow up plan: Return in about 8 weeks (around 08/10/2018) for recheck.  Educational handout given for survey  Remus Loffler PA-C [facility] [address]  [place], [place] [address] [phone]   06/15/2018, 9:47 AM

## 2018-06-15 NOTE — Patient Instructions (Signed)
In a few days you may receive a survey in the mail or online from [employer] regarding your visit with us today. Please take a moment to fill this out. Your feedback is very important to our whole office. It can help us better understand your needs as well as improve your experience and satisfaction. Thank you for taking your time to complete it. We care about you.  Shaakira Borrero, PA-C  

## 2018-06-22 ENCOUNTER — Ambulatory Visit (INDEPENDENT_AMBULATORY_CARE_PROVIDER_SITE_OTHER): Payer: Commercial Managed Care - PPO | Admitting: Physician Assistant

## 2018-06-22 ENCOUNTER — Encounter: Payer: Self-pay | Admitting: Physician Assistant

## 2018-06-22 VITALS — BP 140/89 | HR 83 | Temp 98.4°F | Ht 70.0 in | Wt 214.0 lb

## 2018-06-22 DIAGNOSIS — G8929 Other chronic pain: Secondary | ICD-10-CM | POA: Diagnosis not present

## 2018-06-22 DIAGNOSIS — M25562 Pain in left knee: Secondary | ICD-10-CM | POA: Diagnosis not present

## 2018-06-22 MED ORDER — PREDNISONE 10 MG (21) PO TBPK: ORAL_TABLET | ORAL | 0 refills | Status: DC

## 2018-06-23 NOTE — Progress Notes (Signed)
BP 140/89   Pulse 83   Temp 98.4 F (36.9 C) (Oral)   Ht 5\' 10"  (1.778 m)   Wt 214 lb (97.1 kg)   BMI 30.71 kg/m    Subjective:    Patient ID: John Ware, male    DOB: December 06, 1965, 52 y.o.   MRN: [id number]  HPI: John Ware is a 52 y.o. male presenting on 06/22/2018 for fluid on left knee  He has swelling and pain in the left knee. He has some OA and feels there is clicking at times. He has no relief with medications. Some warmth on the medial area.  Past Medical History:  Diagnosis Date  . Depression   . Diabetes mellitus without complication (HCC)   . Hypertension   . Spinal stenosis    With Neurogenic Claudication   Relevant past medical, surgical, family and social history reviewed and updated as indicated. Interim medical history since our last visit reviewed. Allergies and medications reviewed and updated. DATA REVIEWED: CHART IN EPIC  Family History reviewed for pertinent findings.  Review of Systems  Constitutional: Negative.  Negative for appetite change and fatigue.  Eyes: Negative for pain and visual disturbance.  Respiratory: Negative.  Negative for cough, chest tightness, shortness of breath and wheezing.   Cardiovascular: Negative.  Negative for chest pain, palpitations and leg swelling.  Gastrointestinal: Negative.  Negative for abdominal pain, diarrhea, nausea and vomiting.  Genitourinary: Negative.   Musculoskeletal: Positive for arthralgias and joint swelling.  Skin: Negative.  Negative for color change and rash.  Neurological: Negative.  Negative for weakness, numbness and headaches.  Psychiatric/Behavioral: Negative.     Allergies as of 06/22/2018      Reactions   Tizanidine    Leg cramps      Medication List        Accurate as of 06/22/18 11:59 PM. Always use your most recent med list.          amLODipine 5 MG tablet Commonly known as:  NORVASC Take 1 tablet (5 mg total) by mouth daily.   baclofen 10 MG tablet Commonly  known as:  LIORESAL TAKE (1) TABLET TWICE A DAY.   buPROPion 150 MG 24 hr tablet Commonly known as:  WELLBUTRIN XL TAKE (1) TABLET TWICE A DAY.   diazepam 5 MG tablet Commonly known as:  VALIUM Take 1 tablet (5 mg total) by mouth every 6 (six) hours as needed for anxiety.   DULoxetine 30 MG capsule Commonly known as:  CYMBALTA Take 3 capsules (90 mg total) by mouth daily.   gabapentin 800 MG tablet Commonly known as:  NEURONTIN TAKE  (1)  TABLET  THREE TIMES DAILY.   guaiFENesin 600 MG 12 hr tablet Commonly known as:  MUCINEX Take 600 mg by mouth 2 (two) times daily as needed (congestion).   metFORMIN 500 MG tablet Commonly known as:  GLUCOPHAGE Take 1 tablet (500 mg total) by mouth 3 (three) times daily.   methocarbamol 500 MG tablet Commonly known as:  ROBAXIN Take 1 tablet (500 mg total) by mouth every 6 (six) hours as needed for muscle spasms.   oxyCODONE-acetaminophen 10-325 MG tablet Commonly known as:  PERCOCET Take 1-2 tablets by mouth every 6 (six) hours as needed for pain.   oxyCODONE-acetaminophen 10-325 MG tablet Commonly known as:  PERCOCET Take 1-2 tablets by mouth every 6 (six) hours as needed for pain.   oxyCODONE-acetaminophen 10-325 MG tablet Commonly known as:  PERCOCET Take 1-2 tablets by mouth  every 6 (six) hours as needed for pain.   predniSONE 10 MG (21) Tbpk tablet Commonly known as:  STERAPRED UNI-PAK 21 TAB As directed x 6 days   ramipril 10 MG capsule Commonly known as:  ALTACE TAKE (1) CAPSULE DAILY   risperiDONE 2 MG tablet Commonly known as:  RISPERDAL Take 1 tablet (2 mg total) by mouth at bedtime.          Objective:    BP 140/89   Pulse 83   Temp 98.4 F (36.9 C) (Oral)   Ht 5\' 10"  (1.778 m)   Wt 214 lb (97.1 kg)   BMI 30.71 kg/m   Allergies  Allergen Reactions  . Tizanidine     Leg cramps    Wt Readings from Last 3 Encounters:  06/22/18 214 lb (97.1 kg)  06/15/18 224 lb (101.6 kg)  05/15/18 217 lb 6.4 oz  (98.6 kg)    Physical Exam  Constitutional: He appears well-developed and well-nourished. No distress.  HENT:  Head: Normocephalic and atraumatic.  Eyes: Pupils are equal, round, and reactive to light. Conjunctivae and EOM are normal.  Cardiovascular: Normal rate, regular rhythm and normal heart sounds.  Pulmonary/Chest: Effort normal and breath sounds normal. No respiratory distress.  Musculoskeletal:       Left knee: Tenderness found. Medial joint line tenderness noted.       Legs: Skin: Skin is warm and dry.  Psychiatric: He has a normal mood and affect. His behavior is normal.  Nursing note and vitals reviewed.       Assessment & Plan:   1. Chronic pain of left knee - predniSONE (STERAPRED UNI-PAK 21 TAB) 10 MG (21) TBPK tablet; As directed x 6 days  Dispense: 21 tablet; Refill: 0   Continue all other maintenance medications as listed above.  Follow up plan: No follow-ups on file.  Educational handout given for survey  Remus Loffler PA-C [facility] [address]  [place], [place] [address] [phone]   06/23/2018, 10:14 PM

## 2018-07-06 ENCOUNTER — Other Ambulatory Visit: Payer: Self-pay | Admitting: Physician Assistant

## 2018-07-06 DIAGNOSIS — M48061 Spinal stenosis, lumbar region without neurogenic claudication: Secondary | ICD-10-CM

## 2018-07-28 ENCOUNTER — Other Ambulatory Visit: Payer: Self-pay | Admitting: Physician Assistant

## 2018-08-05 ENCOUNTER — Other Ambulatory Visit: Payer: Self-pay | Admitting: Physician Assistant

## 2018-08-10 ENCOUNTER — Encounter: Payer: Self-pay | Admitting: Physician Assistant

## 2018-08-10 ENCOUNTER — Ambulatory Visit (INDEPENDENT_AMBULATORY_CARE_PROVIDER_SITE_OTHER): Payer: Commercial Managed Care - PPO | Admitting: Physician Assistant

## 2018-08-10 VITALS — BP 137/79 | HR 75 | Temp 98.3°F | Ht 70.0 in | Wt 218.0 lb

## 2018-08-10 DIAGNOSIS — E785 Hyperlipidemia, unspecified: Secondary | ICD-10-CM

## 2018-08-10 DIAGNOSIS — E1029 Type 1 diabetes mellitus with other diabetic kidney complication: Secondary | ICD-10-CM | POA: Diagnosis not present

## 2018-08-10 DIAGNOSIS — M5136 Other intervertebral disc degeneration, lumbar region: Secondary | ICD-10-CM

## 2018-08-10 DIAGNOSIS — M48061 Spinal stenosis, lumbar region without neurogenic claudication: Secondary | ICD-10-CM

## 2018-08-10 DIAGNOSIS — E1169 Type 2 diabetes mellitus with other specified complication: Secondary | ICD-10-CM

## 2018-08-10 DIAGNOSIS — R809 Proteinuria, unspecified: Secondary | ICD-10-CM

## 2018-08-10 DIAGNOSIS — R454 Irritability and anger: Secondary | ICD-10-CM

## 2018-08-10 LAB — BAYER DCA HB A1C WAIVED: HB A1C (BAYER DCA - WAIVED): 7.4 % — ABNORMAL HIGH (ref <7.0)

## 2018-08-10 MED ORDER — OXYCODONE-ACETAMINOPHEN 10-325 MG PO TABS: 1.0000 | ORAL_TABLET | Freq: Four times a day (QID) | ORAL | 0 refills | Status: DC | PRN

## 2018-08-10 MED ORDER — DIVALPROEX SODIUM 250 MG PO DR TAB: 250.0000 mg | DELAYED_RELEASE_TABLET | Freq: Two times a day (BID) | ORAL | 2 refills | Status: DC

## 2018-08-11 LAB — LIPID PANEL
Chol/HDL Ratio: 3.4 ratio (ref 0.0–5.0)
Cholesterol, Total: 191 mg/dL (ref 100–199)
HDL: 57 mg/dL (ref >39)
LDL Calculated: 86 mg/dL (ref 0–99)
Triglycerides: 240 mg/dL — ABNORMAL HIGH (ref 0–149)
VLDL Cholesterol Cal: 48 mg/dL — ABNORMAL HIGH (ref 5–40)

## 2018-08-11 LAB — CMP14+EGFR
ALT: 23 IU/L (ref 0–44)
AST: 22 IU/L (ref 0–40)
Albumin/Globulin Ratio: 2.1 (ref 1.2–2.2)
Albumin: 4.8 g/dL (ref 3.5–5.5)
Alkaline Phosphatase: 120 IU/L — ABNORMAL HIGH (ref 39–117)
BUN/Creatinine Ratio: 14 (ref 9–20)
BUN: 12 mg/dL (ref 6–24)
Bilirubin Total: 0.4 mg/dL (ref 0.0–1.2)
CO2: 24 mmol/L (ref 20–29)
Calcium: 10.2 mg/dL (ref 8.7–10.2)
Chloride: 96 mmol/L (ref 96–106)
Creatinine, Ser: 0.87 mg/dL (ref 0.76–1.27)
GFR calc Af Amer: 116 mL/min/{1.73_m2} (ref >59)
GFR calc non Af Amer: 100 mL/min/{1.73_m2} (ref >59)
Globulin, Total: 2.3 g/dL (ref 1.5–4.5)
Glucose: 142 mg/dL — ABNORMAL HIGH (ref 65–99)
Potassium: 5.2 mmol/L (ref 3.5–5.2)
Sodium: 137 mmol/L (ref 134–144)
Total Protein: 7.1 g/dL (ref 6.0–8.5)

## 2018-08-11 NOTE — Progress Notes (Signed)
BP 137/79   Pulse 75   Temp 98.3 F (36.8 C) (Oral)   Ht _0  (1.778 m)   Wt 218 lb (98.9 kg)   BMI 31.28 kg/m    Subjective:    Patient ID: John Ware, male    DOB: Jun 19, 1966, 52 y.o.   MRN: [id number]  HPI: John Ware is a 52 y.o. male presenting on 08/10/2018 for Diabetes (2 month follow up ) and Hyperlipidemia   This patient comes in for 21-monthfollow-up on several of his medical conditions.  He does have diabetes.  He states he has had fairly decent readings as far as he can tell.  He states that also the risperidone made him feel very irritable and he was not tolerating it very well however he does need something to continue with his mood and anxiety.  We reviewed all of his current medications. This patient returns for a 6 month recheck on narcotic use for DDD and medication refills  Patient currently taking oxycodone. Behavior- normal Medication side effects- none Any concerns- no  [place] CSRS reviewed: Yes Any suspicious activity on [place] Csrs: No   Past Medical History:  Diagnosis Date  . Depression   . [place]   . Hypertension   . Spinal stenosis    With Neurogenic Claudication   Relevant past medical, surgical, family and social history reviewed and updated as indicated. Interim medical history since our last visit reviewed. Allergies and medications reviewed and updated. DATA REVIEWED: CHART IN EPIC  Family History reviewed for pertinent findings.  Review of Systems  Constitutional: Negative.  Negative for appetite change and fatigue.  HENT: Negative.   Eyes: Negative.  Negative for pain and visual disturbance.  Respiratory: Negative.  Negative for cough, chest tightness, shortness of breath and wheezing.   Cardiovascular: Negative.  Negative for chest pain, palpitations and leg swelling.  Gastrointestinal: Negative.  Negative for abdominal pain, diarrhea, nausea and vomiting.  Endocrine: Negative.     Genitourinary: Negative.   Musculoskeletal: Positive for arthralgias, back pain, myalgias, neck pain and neck stiffness.  Skin: Negative.  Negative for color change and rash.  Neurological: Negative.  Negative for weakness, numbness and headaches.  Psychiatric/Behavioral: Positive for dysphoric mood. The patient is nervous/anxious.     Allergies as of 08/10/2018      Reactions   Tizanidine    Leg cramps      Medication List       Accurate as of August 10, 2018 11:59 PM. Always use your most recent med list.        amLODipine 5 MG tablet Commonly known as:  NORVASC TAKE 1 TABLET EVERY DAY   baclofen 10 MG tablet Commonly known as:  LIORESAL TAKE (1) TABLET TWICE A DAY.   buPROPion 150 MG 24 hr tablet Commonly known as:  WELLBUTRIN XL TAKE (1) TABLET TWICE A DAY.   diazepam 5 MG tablet Commonly known as:  VALIUM Take 1 tablet (5 mg total) by mouth every 6 (six) hours as needed for anxiety.   divalproex 250 MG DR tablet Commonly known as:  DEPAKOTE Take 1 tablet (250 mg total) by mouth 2 (two) times daily.   DULoxetine 30 MG capsule Commonly known as:  CYMBALTA Take 3 capsules (90 mg total) by mouth daily.   gabapentin 800 MG tablet Commonly known as:  NEURONTIN TAKE  (1)  TABLET  THREE TIMES DAILY.   guaiFENesin 600 MG 12 hr tablet  Commonly known as:  MUCINEX Take 600 mg by mouth 2 (two) times daily as needed (congestion).   metFORMIN 500 MG tablet Commonly known as:  GLUCOPHAGE Take 1 tablet (500 mg total) by mouth 3 (three) times daily.   methocarbamol 500 MG tablet Commonly known as:  ROBAXIN Take 1 tablet (500 mg total) by mouth every 6 (six) hours as needed for muscle spasms.   oxyCODONE-acetaminophen 10-325 MG tablet Commonly known as:  PERCOCET Take 1-2 tablets by mouth every 6 (six) hours as needed for pain.   oxyCODONE-acetaminophen 10-325 MG tablet Commonly known as:  PERCOCET Take 1-2 tablets by mouth every 6 (six) hours as needed for  pain.   oxyCODONE-acetaminophen 10-325 MG tablet Commonly known as:  PERCOCET Take 1-2 tablets by mouth every 6 (six) hours as needed for pain.   ramipril 10 MG capsule Commonly known as:  ALTACE TAKE (1) CAPSULE DAILY   risperiDONE 2 MG tablet Commonly known as:  RISPERDAL Take 1 tablet (2 mg total) by mouth at bedtime.          Objective:    BP 137/79   Pulse 75   Temp 98.3 F (36.8 C) (Oral)   Ht _0  (1.778 m)   Wt 218 lb (98.9 kg)   BMI 31.28 kg/m   Allergies  Allergen Reactions  . Tizanidine     Leg cramps    Wt Readings from Last 3 Encounters:  08/10/18 218 lb (98.9 kg)  06/22/18 214 lb (97.1 kg)  06/15/18 224 lb (101.6 kg)    Physical Exam Vitals signs and nursing note reviewed.  Constitutional:      General: He is not in acute distress.    Appearance: He is well-developed.  HENT:     Head: Normocephalic and atraumatic.  Eyes:     Conjunctiva/sclera: Conjunctivae normal.     Pupils: Pupils are equal, round, and reactive to light.  Cardiovascular:     Rate and Rhythm: Normal rate and regular rhythm.     Heart sounds: Normal heart sounds.  Pulmonary:     Effort: Pulmonary effort is normal. No respiratory distress.     Breath sounds: Normal breath sounds.  Skin:    General: Skin is warm and dry.  Psychiatric:        Behavior: Behavior normal.     Results for orders placed or performed in visit on 08/10/18  Bayer DCA Hb A1c Waived  Result Value Ref Range   HB A1C (BAYER DCA - WAIVED) 7.4 (H) <7.0 %  Lipid panel  Result Value Ref Range   Cholesterol, Total 191 100 - 199 mg/dL   Triglycerides 240 (H) 0 - 149 mg/dL   HDL 57 >39 mg/dL   VLDL Cholesterol Cal 48 (H) 5 - 40 mg/dL   LDL Calculated 86 0 - 99 mg/dL   Chol/HDL Ratio 3.4 0.0 - 5.0 ratio  CMP14+EGFR  Result Value Ref Range   Glucose 142 (H) 65 - 99 mg/dL   BUN 12 6 - 24 mg/dL   Creatinine, Ser 0.87 0.76 - 1.27 mg/dL   GFR calc non Af Amer 100 >59 mL/min/1.73   GFR calc Af Amer  116 >59 mL/min/1.73   BUN/Creatinine Ratio 14 9 - 20   Sodium 137 134 - 144 mmol/L   Potassium 5.2 3.5 - 5.2 mmol/L   Chloride 96 96 - 106 mmol/L   CO2 24 20 - 29 mmol/L   Calcium 10.2 8.7 - 10.2 mg/dL   Total  Protein 7.1 6.0 - 8.5 g/dL   Albumin 4.8 3.5 - 5.5 g/dL   Globulin, Total 2.3 1.5 - 4.5 g/dL   Albumin/Globulin Ratio 2.1 1.2 - 2.2   Bilirubin Total 0.4 0.0 - 1.2 mg/dL   Alkaline Phosphatase 120 (H) 39 - 117 IU/L   AST 22 0 - 40 IU/L   ALT 23 0 - 44 IU/L      Assessment & Plan:   1. DDD (degenerative disc disease), lumbar - oxyCODONE-acetaminophen (PERCOCET) 10-325 MG tablet; Take 1-2 tablets by mouth every 6 (six) hours as needed for pain.  Dispense: 180 tablet; Refill: 0 - oxyCODONE-acetaminophen (PERCOCET) 10-325 MG tablet; Take 1-2 tablets by mouth every 6 (six) hours as needed for pain.  Dispense: 180 tablet; Refill: 0 - oxyCODONE-acetaminophen (PERCOCET) 10-325 MG tablet; Take 1-2 tablets by mouth every 6 (six) hours as needed for pain.  Dispense: 180 tablet; Refill: 0  2. Spinal stenosis of lumbar region, unspecified whether neurogenic claudication present - oxyCODONE-acetaminophen (PERCOCET) 10-325 MG tablet; Take 1-2 tablets by mouth every 6 (six) hours as needed for pain.  Dispense: 180 tablet; Refill: 0 - oxyCODONE-acetaminophen (PERCOCET) 10-325 MG tablet; Take 1-2 tablets by mouth every 6 (six) hours as needed for pain.  Dispense: 180 tablet; Refill: 0 - oxyCODONE-acetaminophen (PERCOCET) 10-325 MG tablet; Take 1-2 tablets by mouth every 6 (six) hours as needed for pain.  Dispense: 180 tablet; Refill: 0  3. 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 Hyperlipidemia associated with type 2 diabetes mellitus ([place]) - Bayer DCA Hb A1c Waived - Lipid panel - CMP14+EGFR  5. Irritability and anger - divalproex (DEPAKOTE) 250 MG DR tablet; Take 1 tablet (250 mg total) by mouth 2 (two) times daily.   Dispense: 60 tablet; Refill: 2   Continue all other maintenance medications as listed above.  Follow up plan: Return in about 4 weeks (around 09/07/2018).  Educational handout given for [place] PA-C [place] [address]  [place], [place] [address] [phone]   08/11/2018, 10:33 AM

## 2018-09-01 ENCOUNTER — Other Ambulatory Visit: Payer: Self-pay | Admitting: Physician Assistant

## 2018-09-01 DIAGNOSIS — M48061 Spinal stenosis, lumbar region without neurogenic claudication: Secondary | ICD-10-CM

## 2018-09-08 ENCOUNTER — Ambulatory Visit: Payer: Commercial Managed Care - PPO | Admitting: Physician Assistant

## 2018-10-05 ENCOUNTER — Other Ambulatory Visit: Payer: Self-pay | Admitting: Physician Assistant

## 2018-10-05 DIAGNOSIS — M48061 Spinal stenosis, lumbar region without neurogenic claudication: Secondary | ICD-10-CM

## 2018-10-07 ENCOUNTER — Other Ambulatory Visit: Payer: Self-pay | Admitting: Physician Assistant

## 2018-10-07 MED ORDER — VARENICLINE TARTRATE 0.5 MG X 11 & 1 MG X 42 PO MISC: ORAL | 5 refills | Status: DC

## 2018-10-30 ENCOUNTER — Ambulatory Visit (INDEPENDENT_AMBULATORY_CARE_PROVIDER_SITE_OTHER): Payer: Commercial Managed Care - PPO | Admitting: Physician Assistant

## 2018-10-30 ENCOUNTER — Encounter: Payer: Self-pay | Admitting: Physician Assistant

## 2018-10-30 VITALS — BP 132/85 | HR 86 | Temp 97.2°F | Ht 70.0 in | Wt 211.0 lb

## 2018-10-30 DIAGNOSIS — M48061 Spinal stenosis, lumbar region without neurogenic claudication: Secondary | ICD-10-CM

## 2018-10-30 DIAGNOSIS — E119 Type 2 diabetes mellitus without complications: Secondary | ICD-10-CM | POA: Insufficient documentation

## 2018-10-30 DIAGNOSIS — F339 Major depressive disorder, recurrent, unspecified: Secondary | ICD-10-CM

## 2018-10-30 DIAGNOSIS — R809 Proteinuria, unspecified: Secondary | ICD-10-CM

## 2018-10-30 DIAGNOSIS — E1129 Type 2 diabetes mellitus with other diabetic kidney complication: Secondary | ICD-10-CM

## 2018-10-30 DIAGNOSIS — M5136 Other intervertebral disc degeneration, lumbar region: Secondary | ICD-10-CM | POA: Diagnosis not present

## 2018-10-30 DIAGNOSIS — M48062 Spinal stenosis, lumbar region with neurogenic claudication: Secondary | ICD-10-CM

## 2018-10-30 DIAGNOSIS — M5137 Other intervertebral disc degeneration, lumbosacral region: Secondary | ICD-10-CM

## 2018-10-30 MED ORDER — OXYCODONE HCL ER 40 MG PO T12A: 40.0000 mg | EXTENDED_RELEASE_TABLET | Freq: Two times a day (BID) | ORAL | 0 refills | Status: DC

## 2018-10-30 MED ORDER — NALOXONE HCL 4 MG/0.1ML NA LIQD: 1.0000 | Freq: Once | NASAL | 1 refills | Status: AC

## 2018-11-01 NOTE — Progress Notes (Signed)
BP 132/85   Pulse 86   Temp (!) 97.2 F (36.2 C) (Oral)   Ht _0  (1.778 m)   Wt 211 lb (95.7 kg)   BMI 30.28 kg/m    Subjective:    Patient ID: John Ware, male    DOB: 20-Apr-1966, 53 y.o.   MRN: [id number]  HPI: John Ware is a 53 y.o. male presenting on 10/30/2018 for Pain (3 month)   PAIN ASSESSMENT: Cause of pain- chronic cervical and lumbar pain from DDD and stenosis  MRI 2018 1. Multifactorial severe spinal canal and bilateral neural foraminal stenosis at L4-L5. 2. Multifactorial severe bilateral neural foraminal stenosis at L5-S1. 3. Multi tectorial moderate spinal canal stenosis and moderate right, severe left neural foraminal stenosis at L3-L4. This patient returns for a 3 month recheck on narcotic use for the above named conditions  Patient currently taking Percocet 10/325 6 daily Reducing by one tablet daily. Taking robaxin and gabapentin 800 mg TID Cymbalta 90 mg daily  Cannot take NSAID due to diabetes with persistent microalbuminuria  He is still having severe pain at times and know that he may have to see the neurosurgeon again. Behavior- normal Medication side effects- no Any concerns- no Pain on scale of 1-10- 8-10/10 Frequency- daily What increases pain- standing, lifting, squating What makes pain Better- rest and medications Effects on ADL - mild Any change in general medical condition- no    Effectiveness of current meds- moderate Adverse reactions form pain meds-no PMP AWARE website reviewed: Yes Any suspicious activity on PMP Aware: No MME daily dose: *120, reducing to 80 MME Contract on file Last UDS  10/30/2018  [facility] script sent or current  History of overdose or risk of abuse no  Past Medical History:  Diagnosis Date  . Depression   . Diabetes mellitus without complication ([place])   . Hypertension   . Spinal stenosis    With Neurogenic Claudication   Relevant past medical, surgical, family and social history  reviewed and updated as indicated. Interim medical history since our last visit reviewed. Allergies and medications reviewed and updated. DATA REVIEWED: CHART IN EPIC  Family History reviewed for pertinent findings.  Review of Systems  Constitutional: Negative.  Negative for appetite change and fatigue.  HENT: Negative.   Eyes: Negative.  Negative for pain and visual disturbance.  Respiratory: Negative.  Negative for cough, chest tightness, shortness of breath and wheezing.   Cardiovascular: Negative.  Negative for chest pain, palpitations and leg swelling.  Gastrointestinal: Negative.  Negative for abdominal pain, diarrhea, nausea and vomiting.  Endocrine: Negative.   Genitourinary: Negative.   Musculoskeletal: Positive for arthralgias, back pain, myalgias, neck pain and neck stiffness.  Skin: Negative.  Negative for color change and rash.  Neurological: Negative.  Negative for weakness, numbness and headaches.  Psychiatric/Behavioral: Negative.     Allergies as of 10/30/2018      Reactions   Tizanidine    Leg cramps      Medication List       Accurate as of October 30, 2018 11:59 PM. Always use your most recent med list.        amLODipine 5 MG tablet Commonly known as:  NORVASC TAKE 1 TABLET EVERY DAY   buPROPion 150 MG 24 hr tablet Commonly known as:  WELLBUTRIN XL TAKE (1) TABLET TWICE A DAY.   diazepam 5 MG tablet Commonly known as:  Valium Take 1 tablet (5 mg total) by mouth every 6 (six) hours  as needed for anxiety.   divalproex 250 MG DR tablet Commonly known as:  Depakote Take 1 tablet (250 mg total) by mouth 2 (two) times daily.   DULoxetine 30 MG capsule Commonly known as:  CYMBALTA Take 3 capsules (90 mg total) by mouth daily.   gabapentin 800 MG tablet Commonly known as:  NEURONTIN TAKE (1) TABLET THREE TIMES DAILY.   guaiFENesin 600 MG 12 hr tablet Commonly known as:  MUCINEX Take 600 mg by mouth 2 (two) times daily as needed (congestion).     metFORMIN 500 MG tablet Commonly known as:  GLUCOPHAGE Take 1 tablet (500 mg total) by mouth 3 (three) times daily.   methocarbamol 500 MG tablet Commonly known as:  ROBAXIN Take 1 tablet (500 mg total) by mouth every 6 (six) hours as needed for muscle spasms.   naloxone 4 MG/0.1ML Liqd nasal spray kit Commonly known as:  NARCAN Place 1 spray into the nose once for 1 dose.   oxyCODONE 40 mg 12 hr tablet Commonly known as:  OxyCONTIN Take 1 tablet (40 mg total) by mouth every 12 (twelve) hours.   oxyCODONE 40 mg 12 hr tablet Commonly known as:  OxyCONTIN Take 1 tablet (40 mg total) by mouth every 12 (twelve) hours.   ramipril 10 MG capsule Commonly known as:  ALTACE TAKE (1) CAPSULE DAILY          Objective:    BP 132/85   Pulse 86   Temp (!) 97.2 F (36.2 C) (Oral)   Ht _0  (1.778 m)   Wt 211 lb (95.7 kg)   BMI 30.28 kg/m   Allergies  Allergen Reactions  . Tizanidine     Leg cramps    Wt Readings from Last 3 Encounters:  10/30/18 211 lb (95.7 kg)  08/10/18 218 lb (98.9 kg)  06/22/18 214 lb (97.1 kg)    Physical Exam Vitals signs and nursing note reviewed.  Constitutional:      General: He is not in acute distress.    Appearance: He is well-developed.  HENT:     Head: Normocephalic and atraumatic.  Eyes:     Conjunctiva/sclera: Conjunctivae normal.     Pupils: Pupils are equal, round, and reactive to light.  Cardiovascular:     Rate and Rhythm: Normal rate and regular rhythm.     Heart sounds: Normal heart sounds.  Pulmonary:     Effort: Pulmonary effort is normal. No respiratory distress.     Breath sounds: Normal breath sounds.  Skin:    General: Skin is warm and dry.  Psychiatric:        Behavior: Behavior normal.     Results for orders placed or performed in visit on 08/10/18  Bayer DCA Hb A1c Waived  Result Value Ref Range   HB A1C (BAYER DCA - WAIVED) 7.4 (H) <7.0 %  Lipid panel  Result Value Ref Range   Cholesterol, Total 191  100 - 199 mg/dL   Triglycerides 240 (H) 0 - 149 mg/dL   HDL 57 >39 mg/dL   VLDL Cholesterol Cal 48 (H) 5 - 40 mg/dL   LDL Calculated 86 0 - 99 mg/dL   Chol/HDL Ratio 3.4 0.0 - 5.0 ratio  CMP14+EGFR  Result Value Ref Range   Glucose 142 (H) 65 - 99 mg/dL   BUN 12 6 - 24 mg/dL   Creatinine, Ser 0.87 0.76 - 1.27 mg/dL   GFR calc non Af Amer 100 >59 mL/min/1.73   GFR calc  Af Amer 116 >59 mL/min/1.73   BUN/Creatinine Ratio 14 9 - 20   Sodium 137 134 - 144 mmol/L   Potassium 5.2 3.5 - 5.2 mmol/L   Chloride 96 96 - 106 mmol/L   CO2 24 20 - 29 mmol/L   Calcium 10.2 8.7 - 10.2 mg/dL   Total Protein 7.1 6.0 - 8.5 g/dL   Albumin 4.8 3.5 - 5.5 g/dL   Globulin, Total 2.3 1.5 - 4.5 g/dL   Albumin/Globulin Ratio 2.1 1.2 - 2.2   Bilirubin Total 0.4 0.0 - 1.2 mg/dL   Alkaline Phosphatase 120 (H) 39 - 117 IU/L   AST 22 0 - 40 IU/L   ALT 23 0 - 44 IU/L      Assessment & Plan:   1. DDD (degenerative disc disease), lumbar - naloxone (NARCAN) nasal spray 4 mg/0.1 mL; Place 1 spray into the nose once for 1 dose.  Dispense: 1 kit; Refill: 1 - ToxASSURE Select 13 (MW), Urine - oxyCODONE (OXYCONTIN) 40 mg 12 hr tablet; Take 1 tablet (40 mg total) by mouth every 12 (twelve) hours.  Dispense: 14 tablet; Refill: 0 - oxyCODONE (OXYCONTIN) 40 mg 12 hr tablet; Take 1 tablet (40 mg total) by mouth every 12 (twelve) hours.  Dispense: 60 tablet; Refill: 0  2. Spinal stenosis of lumbar region, unspecified whether neurogenic claudication present - naloxone (NARCAN) nasal spray 4 mg/0.1 mL; Place 1 spray into the nose once for 1 dose.  Dispense: 1 kit; Refill: 1 - ToxASSURE Select 13 (MW), Urine - oxyCODONE (OXYCONTIN) 40 mg 12 hr tablet; Take 1 tablet (40 mg total) by mouth every 12 (twelve) hours.  Dispense: 14 tablet; Refill: 0 - oxyCODONE (OXYCONTIN) 40 mg 12 hr tablet; Take 1 tablet (40 mg total) by mouth every 12 (twelve) hours.  Dispense: 60 tablet; Refill: 0  3. Type 2 diabetes mellitus with  microalbuminuria, without long-term current use of insulin (HCC) Continue monitoring  4. DDD (degenerative disc disease), lumbosacral Continue treatment  5. Lumbar stenosis with neurogenic claudication Continue treatment  6. Depression, recurrent ([place]) Continue treatment  Continue all other maintenance medications as listed above.  Follow up plan: Return in about 4 weeks (around 11/27/2018) for recheck.  Educational handout given for [place] PA-C [place] [address]  [place], [place] [address] [phone]   11/01/2018, 11:01 PM

## 2018-11-02 NOTE — Progress Notes (Signed)
Agree patient should be weaned.  I am not aware that use of NSAID with proteinuria is a contraindication to NSAID use.  Patient on ACE-I, which should be renal protective.  Would reconsider using NSAID PRN to control chronic pain. Creatinine is normal.  John Ware M. Nadine Counts, DO [facility]

## 2018-11-03 ENCOUNTER — Telehealth: Payer: Self-pay

## 2018-11-03 ENCOUNTER — Other Ambulatory Visit: Payer: Self-pay | Admitting: Physician Assistant

## 2018-11-03 ENCOUNTER — Encounter: Payer: Self-pay | Admitting: Physician Assistant

## 2018-11-03 DIAGNOSIS — M48061 Spinal stenosis, lumbar region without neurogenic claudication: Secondary | ICD-10-CM

## 2018-11-03 MED ORDER — OXYCODONE-ACETAMINOPHEN 10-325 MG PO TABS: 1.0000 | ORAL_TABLET | Freq: Three times a day (TID) | ORAL | 0 refills | Status: DC | PRN

## 2018-11-03 NOTE — Telephone Encounter (Signed)
Patient needs prior auth for Oxycontin  I need to know total daily morphine equivalent dose for one of the questions?

## 2018-11-03 NOTE — Telephone Encounter (Signed)
Oxydontin approved for prior authorization

## 2018-11-03 NOTE — Telephone Encounter (Signed)
Thanks

## 2018-11-05 LAB — TOXASSURE SELECT 13 (MW), URINE

## 2018-11-06 ENCOUNTER — Other Ambulatory Visit: Payer: Self-pay | Admitting: Physician Assistant

## 2018-11-06 DIAGNOSIS — R454 Irritability and anger: Secondary | ICD-10-CM

## 2018-11-16 ENCOUNTER — Encounter: Payer: Self-pay | Admitting: Physician Assistant

## 2018-11-17 ENCOUNTER — Other Ambulatory Visit: Payer: Self-pay | Admitting: Physician Assistant

## 2018-11-17 DIAGNOSIS — M5136 Other intervertebral disc degeneration, lumbar region: Secondary | ICD-10-CM

## 2018-11-17 DIAGNOSIS — M48061 Spinal stenosis, lumbar region without neurogenic claudication: Secondary | ICD-10-CM

## 2018-11-30 ENCOUNTER — Ambulatory Visit: Payer: Commercial Managed Care - PPO | Admitting: Physician Assistant

## 2018-12-01 ENCOUNTER — Other Ambulatory Visit: Payer: Self-pay

## 2018-12-01 ENCOUNTER — Ambulatory Visit (INDEPENDENT_AMBULATORY_CARE_PROVIDER_SITE_OTHER): Payer: Commercial Managed Care - PPO | Admitting: Physician Assistant

## 2018-12-01 ENCOUNTER — Encounter: Payer: Self-pay | Admitting: Physician Assistant

## 2018-12-01 VITALS — BP 136/96 | HR 103 | Temp 97.6°F | Ht 70.0 in | Wt 206.2 lb

## 2018-12-01 DIAGNOSIS — M48062 Spinal stenosis, lumbar region with neurogenic claudication: Secondary | ICD-10-CM

## 2018-12-01 DIAGNOSIS — M5137 Other intervertebral disc degeneration, lumbosacral region: Secondary | ICD-10-CM | POA: Diagnosis not present

## 2018-12-01 DIAGNOSIS — M51379 Other intervertebral disc degeneration, lumbosacral region without mention of lumbar back pain or lower extremity pain: Secondary | ICD-10-CM

## 2018-12-01 MED ORDER — NALOXONE HCL 4 MG/0.1ML NA LIQD: 1.0000 | Freq: Once | NASAL | 1 refills | Status: AC

## 2018-12-01 MED ORDER — OXYCODONE-ACETAMINOPHEN 10-325 MG PO TABS: 1.0000 | ORAL_TABLET | Freq: Three times a day (TID) | ORAL | 0 refills | Status: DC | PRN

## 2018-12-01 NOTE — Progress Notes (Signed)
BP (!) 136/96   Pulse (!) 103   Temp 97.6 F (36.4 C) (Oral)   Ht  (1.778 m)   Wt 206 lb 3.2 oz (93.5 kg)   BMI 29.59 kg/m    Subjective:    Patient ID: John Ware, male    DOB: [birth year], 53 y.o.   MRN: [id number]  HPI: John Ware is a 53 y.o. male presenting on 12/01/2018 for recheck pain management  PAIN ASSESSMENT: Cause of pain-degenerative disc disease throughout spine and spinal stenosis  MRI 2018 1. Multifactorial severe spinal canal and bilateral neural foraminal stenosis at L4-L5. Multifactorial severe bilateral neural foraminal stenosis at L5-S1. Multi tectorial moderate spinal canal stenosis and moderate right, severe left neural foraminal stenosis at L3-L4.   Current medications-Percocet 10/325 1 to 2 tablets 3 times a day Pain on scale of 1-10- 7 Frequency- daily What increases pain- standing an dlifting What makes pain Better- rest Effects on ADL - mild Any change in general medical condition- no  Effectiveness of current meds- good Adverse reactions form pain meds-no PMP AWARE website reviewed: Yes Any suspicious activity on PMP Aware: No MME daily dose: 80 MME A plan to go to 75 MME the next couple of months.  Our goal is to reach 50 MME  Contract on file Last UDS  12/01/2018  [facility] script sent or current  History of overdose or risk of abuse no  This patient comes in for a narcotic recheck.  It was our plan to switch him over to OxyContin.  However his insurance does not like to pay for this.  There is supposed to be a generic coming out soon.  However at this time we are needing to continue his Percocet dosing.  He is currently taking 6 tablets a day.  We had reduced that at the last visit.  Work in a continued to have a lower dose.  He is going to go to 5 tablets a day over the next 2 months.  And I will see him back at that time.  The plan is for [place] to get either on OxyContin 15 to 20 mg twice daily or an equal amount in the  Percocet.  I have explained to him that are narcotic policy allows for approximately 50 MME dosing here in our office.  If it is much more than that he has to go to a pain clinic.  He understands.  Past Medical History:  Diagnosis Date  . Depression   . Diabetes mellitus without complication (HCC)   . Hypertension   . Spinal stenosis    With Neurogenic Claudication   Relevant past medical, surgical, family and social history reviewed and updated as indicated. Interim medical history since our last visit reviewed. Allergies and medications reviewed and updated. DATA REVIEWED: CHART IN EPIC  Family History reviewed for pertinent findings.  Review of Systems  Constitutional: Negative.  Negative for appetite change and fatigue.  Eyes: Negative for pain and visual disturbance.  Respiratory: Negative.  Negative for cough, chest tightness, shortness of breath and wheezing.   Cardiovascular: Negative.  Negative for chest pain, palpitations and leg swelling.  Gastrointestinal: Negative.  Negative for abdominal pain, diarrhea, nausea and vomiting.  Genitourinary: Negative.   Skin: Negative.  Negative for color change and rash.  Neurological: Negative.  Negative for weakness, numbness and headaches.  Psychiatric/Behavioral: Negative.     Allergies as of 12/01/2018      Reactions   Tizanidine  Leg cramps      Medication List       Accurate as of December 01, 2018 10:40 AM. Always use your most recent med list.        amLODipine 5 MG tablet Commonly known as:  NORVASC TAKE 1 TABLET EVERY DAY   buPROPion 150 MG 24 hr tablet Commonly known as:  WELLBUTRIN XL TAKE (1) TABLET TWICE A DAY.   divalproex 250 MG DR tablet Commonly known as:  DEPAKOTE Take 1 tablet (250 mg total) by mouth 2 (two) times daily.   DULoxetine 30 MG capsule Commonly known as:  CYMBALTA Take 3 capsules (90 mg total) by mouth daily.   gabapentin 800 MG tablet Commonly known as:  NEURONTIN TAKE (1) TABLET  THREE TIMES DAILY.   guaiFENesin 600 MG 12 hr tablet Commonly known as:  MUCINEX Take 600 mg by mouth 2 (two) times daily as needed (congestion).   metFORMIN 500 MG tablet Commonly known as:  GLUCOPHAGE Take 1 tablet (500 mg total) by mouth 3 (three) times daily.   methocarbamol 500 MG tablet Commonly known as:  ROBAXIN Take 1 tablet (500 mg total) by mouth every 6 (six) hours as needed for muscle spasms.   oxyCODONE-acetaminophen 10-325 MG tablet Commonly known as:  PERCOCET Take 1-2 tablets by mouth every 8 (eight) hours as needed for pain.   oxyCODONE-acetaminophen 10-325 MG tablet Commonly known as:  PERCOCET Take 1-2 tablets by mouth every 8 (eight) hours as needed for pain.   ramipril 10 MG capsule Commonly known as:  ALTACE TAKE (1) CAPSULE DAILY          Objective:    BP (!) 136/96   Pulse (!) 103   Temp 97.6 F (36.4 C) (Oral)   Ht 5\' 10"  (1.778 m)   Wt 206 lb 3.2 oz (93.5 kg)   BMI 29.59 kg/m   Allergies  Allergen Reactions  . Tizanidine     Leg cramps    Wt Readings from Last 3 Encounters:  12/01/18 206 lb 3.2 oz (93.5 kg)  10/30/18 211 lb (95.7 kg)  08/10/18 218 lb (98.9 kg)    Physical Exam Vitals signs and nursing note reviewed.  Constitutional:      General: He is not in acute distress.    Appearance: He is well-developed.  HENT:     Head: Normocephalic and atraumatic.  Cardiovascular:     Rate and Rhythm: Normal rate.  Pulmonary:     Effort: Pulmonary effort is normal. No respiratory distress.  Skin:    General: Skin is warm and dry.  Psychiatric:        Behavior: Behavior normal.     Results for orders placed or performed in visit on 10/30/18  ToxASSURE Select 13 (MW), Urine  Result Value Ref Range   Summary FINAL       Assessment & Plan:   1. DDD (degenerative disc disease), lumbosacral - oxyCODONE-acetaminophen (PERCOCET) 10-325 MG tablet; Take 1-2 tablets by mouth every 8 (eight) hours as needed for pain.  Dispense: 180  tablet; Refill: 0 - oxyCODONE-acetaminophen (PERCOCET) 10-325 MG tablet; Take 1-2 tablets by mouth every 8 (eight) hours as needed for pain.  Dispense: 180 tablet; Refill: 0 - ToxASSURE Select 13 (MW), Urine  2. Spinal stenosis of lumbar region with neurogenic claudication - oxyCODONE-acetaminophen (PERCOCET) 10-325 MG tablet; Take 1-2 tablets by mouth every 8 (eight) hours as needed for pain.  Dispense: 180 tablet; Refill: 0 - oxyCODONE-acetaminophen (PERCOCET) 10-325 MG tablet;  Take 1-2 tablets by mouth every 8 (eight) hours as needed for pain.  Dispense: 180 tablet; Refill: 0 - ToxASSURE Select 13 (MW), Urine   Continue all other maintenance medications as listed above.  Follow up plan: Return in about 2 months (around 01/31/2019).  Educational handout given for survey  Remus LofflerAngel S. Kirklin Mcduffee PA-C [facility] [address]  [place], [place] [id number] 279 [phone]   12/01/2018, 10:40 AM

## 2018-12-02 ENCOUNTER — Other Ambulatory Visit: Payer: Self-pay | Admitting: Physician Assistant

## 2018-12-03 LAB — TOXASSURE SELECT 13 (MW), URINE

## 2018-12-17 ENCOUNTER — Other Ambulatory Visit: Payer: Self-pay | Admitting: Physician Assistant

## 2018-12-17 ENCOUNTER — Encounter: Payer: Self-pay | Admitting: Physician Assistant

## 2018-12-18 ENCOUNTER — Other Ambulatory Visit: Payer: Self-pay | Admitting: Physician Assistant

## 2018-12-18 MED ORDER — AMOXICILLIN-POT CLAVULANATE 875-125 MG PO TABS: 1.0000 | ORAL_TABLET | Freq: Two times a day (BID) | ORAL | 0 refills | Status: DC

## 2018-12-18 MED ORDER — FLUTICASONE PROPIONATE 50 MCG/ACT NA SUSP: 2.0000 | Freq: Every day | NASAL | 6 refills | Status: DC

## 2018-12-31 ENCOUNTER — Encounter: Payer: Self-pay | Admitting: Physician Assistant

## 2019-01-01 ENCOUNTER — Other Ambulatory Visit: Payer: Self-pay | Admitting: Physician Assistant

## 2019-01-01 MED ORDER — AMOXICILLIN-POT CLAVULANATE 875-125 MG PO TABS: 1.0000 | ORAL_TABLET | Freq: Two times a day (BID) | ORAL | 0 refills | Status: DC

## 2019-01-19 ENCOUNTER — Other Ambulatory Visit: Payer: Self-pay | Admitting: Physician Assistant

## 2019-02-01 ENCOUNTER — Other Ambulatory Visit: Payer: Self-pay

## 2019-02-01 ENCOUNTER — Ambulatory Visit (INDEPENDENT_AMBULATORY_CARE_PROVIDER_SITE_OTHER): Payer: Commercial Managed Care - PPO | Admitting: Physician Assistant

## 2019-02-01 ENCOUNTER — Other Ambulatory Visit: Payer: Self-pay | Admitting: Physician Assistant

## 2019-02-01 DIAGNOSIS — M5137 Other intervertebral disc degeneration, lumbosacral region: Secondary | ICD-10-CM | POA: Diagnosis not present

## 2019-02-01 DIAGNOSIS — R454 Irritability and anger: Secondary | ICD-10-CM

## 2019-02-01 DIAGNOSIS — M48061 Spinal stenosis, lumbar region without neurogenic claudication: Secondary | ICD-10-CM | POA: Diagnosis not present

## 2019-02-01 DIAGNOSIS — M48062 Spinal stenosis, lumbar region with neurogenic claudication: Secondary | ICD-10-CM | POA: Diagnosis not present

## 2019-02-01 MED ORDER — METHOCARBAMOL 500 MG PO TABS: 500.0000 mg | ORAL_TABLET | Freq: Four times a day (QID) | ORAL | 5 refills | Status: DC | PRN

## 2019-02-01 MED ORDER — OXYCODONE-ACETAMINOPHEN 7.5-325 MG PO TABS: 1.0000 | ORAL_TABLET | Freq: Four times a day (QID) | ORAL | 0 refills | Status: DC | PRN

## 2019-02-01 MED ORDER — DIVALPROEX SODIUM 250 MG PO DR TAB: 250.0000 mg | DELAYED_RELEASE_TABLET | Freq: Two times a day (BID) | ORAL | 5 refills | Status: DC

## 2019-02-01 MED ORDER — GABAPENTIN 800 MG PO TABS: 800.0000 mg | ORAL_TABLET | Freq: Three times a day (TID) | ORAL | 5 refills | Status: DC

## 2019-02-01 MED ORDER — DULOXETINE HCL 30 MG PO CPEP: 90.0000 mg | ORAL_CAPSULE | Freq: Every day | ORAL | 5 refills | Status: DC

## 2019-02-01 NOTE — Progress Notes (Signed)
Telephone visit  Subjective: ZO:XWRUCC:pain check PCP: Remus LofflerJones, Dayanis Bergquist S, PA-C EAV:[id number]:John Ware is a 53 y.o. male calls for telephone consult today. Patient provides verbal consent for consult held via phone.  Patient is identified with 2 separate identifiers.  At this time the entire area is on COVID-19 social distancing and stay home orders are in place.  Patient is of higher risk and therefore we are performing this by a virtual method.  Location of patient: home Location of provider: HOME Others present for call: no  PAIN ASSESSMENT: Cause of pain-degenerative disc disease throughout spine and spinal stenosis  MRI 2018 1. Multifactorial severe spinal canal and bilateral neural foraminal stenosis at L4-L5. Multifactorial severe bilateral neural foraminal stenosis at L5-S1. Multi tectorial moderate spinal canal stenosis and moderate right, severe left neural foraminal stenosis at L3-L4.   Current medications-Percocet 10/325 1 to 2 tablets 3 times a day Pain on scale of 1-10- 7 Frequency- daily What increases pain- standing an dlifting What makes pain Better- rest Effects on ADL - mild Any change in general medical condition- no  Effectiveness of current meds- good Adverse reactions form pain meds-no PMP AWARE website reviewed: Yes Any suspicious activity on PMP Aware: No MME daily dose: 80 MME A plan to go to 75 MME the next couple of months.  Our goal is to reach 50 MME  Contract on file Last UDS  [facility]01/2018  [facility]  History of overdose or risk of abuse no    ROS: Per HPI  Allergies  Allergen Reactions  . Tizanidine     Leg cramps   Past Medical History:  Diagnosis Date  . Depression   . Diabetes mellitus without complication (HCC)   . Hypertension   . Spinal stenosis    With Neurogenic Claudication    Current Outpatient Medications:  .  amLODipine (NORVASC) 5 MG tablet, TAKE 1 TABLET EVERY DAY, Disp: 90 tablet, Rfl: 0  .  buPROPion (WELLBUTRIN XL) 150 MG 24 hr tablet, TAKE (1) TABLET TWICE A DAY., Disp: 60 tablet, Rfl: 2 .  divalproex (DEPAKOTE) 250 MG DR tablet, Take 1 tablet (250 mg total) by mouth 2 (two) times daily., Disp: 60 tablet, Rfl: 5 .  DULoxetine (CYMBALTA) 30 MG capsule, Take 3 capsules (90 mg total) by mouth daily., Disp: 90 capsule, Rfl: 5 .  fluticasone (FLONASE) 50 MCG/ACT nasal spray, Place 2 sprays into both nostrils daily., Disp: 16 g, Rfl: 6 .  gabapentin (NEURONTIN) 800 MG tablet, Take 1 tablet (800 mg total) by mouth 3 (three) times daily., Disp: 90 tablet, Rfl: 5 .  guaiFENesin (MUCINEX) 600 MG 12 hr tablet, Take 600 mg by mouth 2 (two) times daily as needed (congestion)., Disp: , Rfl:  .  metFORMIN (GLUCOPHAGE) 500 MG tablet, Take 1 tablet (500 mg total) by mouth 3 (three) times daily., Disp: 90 tablet, Rfl: 11 .  methocarbamol (ROBAXIN) 500 MG tablet, Take 1 tablet (500 mg total) by mouth every 6 (six) hours as needed for muscle spasms., Disp: 90 tablet, Rfl: 5 .  oxyCODONE-acetaminophen (PERCOCET) 7.5-325 MG tablet, Take 1-2 tablets by mouth every 6 (six) hours as needed for severe pain., Disp: 180 tablet, Rfl: 0 .  oxyCODONE-acetaminophen (PERCOCET) 7.5-325 MG tablet, Take 1-2 tablets by mouth every 6 (six) hours as needed for severe pain., Disp: 180 tablet, Rfl: 0 .  oxyCODONE-acetaminophen (PERCOCET) 7.5-325 MG tablet, Take 1-2 tablets by mouth every 6 (six) hours as needed for  severe pain., Disp: 180 tablet, Rfl: 0 .  ramipril (ALTACE) 10 MG capsule, TAKE (1) CAPSULE DAILY, Disp: 90 capsule, Rfl: 3  Assessment/ Plan: 53 y.o. male   1. DDD (degenerative disc disease), lumbosacral - oxyCODONE-acetaminophen (PERCOCET) 7.5-325 MG tablet; Take 1-2 tablets by mouth every 6 (six) hours as needed for severe pain.  Dispense: 180 tablet; Refill: 0 - oxyCODONE-acetaminophen (PERCOCET) 7.5-325 MG tablet; Take 1-2 tablets by mouth every 6 (six) hours as needed for severe pain.  Dispense: 180  tablet; Refill: 0 - oxyCODONE-acetaminophen (PERCOCET) 7.5-325 MG tablet; Take 1-2 tablets by mouth every 6 (six) hours as needed for severe pain.  Dispense: 180 tablet; Refill: 0 - methocarbamol (ROBAXIN) 500 MG tablet; Take 1 tablet (500 mg total) by mouth every 6 (six) hours as needed for muscle spasms.  Dispense: 90 tablet; Refill: 5 - gabapentin (NEURONTIN) 800 MG tablet; Take 1 tablet (800 mg total) by mouth 3 (three) times daily.  Dispense: 90 tablet; Refill: 5 - DULoxetine (CYMBALTA) 30 MG capsule; Take 3 capsules (90 mg total) by mouth daily.  Dispense: 90 capsule; Refill: 5  MME daily dose: 80 MME A plan to go to 75 MME the next couple of months.  Our goal is to reach 50 MME    2. Spinal stenosis of lumbar region with neurogenic claudication - oxyCODONE-acetaminophen (PERCOCET) 7.5-325 MG tablet; Take 1-2 tablets by mouth every 6 (six) hours as needed for severe pain.  Dispense: 180 tablet; Refill: 0 - oxyCODONE-acetaminophen (PERCOCET) 7.5-325 MG tablet; Take 1-2 tablets by mouth every 6 (six) hours as needed for severe pain.  Dispense: 180 tablet; Refill: 0 - oxyCODONE-acetaminophen (PERCOCET) 7.5-325 MG tablet; Take 1-2 tablets by mouth every 6 (six) hours as needed for severe pain.  Dispense: 180 tablet; Refill: 0 - methocarbamol (ROBAXIN) 500 MG tablet; Take 1 tablet (500 mg total) by mouth every 6 (six) hours as needed for muscle spasms.  Dispense: 90 tablet; Refill: 5 - gabapentin (NEURONTIN) 800 MG tablet; Take 1 tablet (800 mg total) by mouth 3 (three) times daily.  Dispense: 90 tablet; Refill: 5 - DULoxetine (CYMBALTA) 30 MG capsule; Take 3 capsules (90 mg total) by mouth daily.  Dispense: 90 capsule; Refill: 5  3. Spinal stenosis of lumbar region, unspecified whether neurogenic claudication present - oxyCODONE-acetaminophen (PERCOCET) 7.5-325 MG tablet; Take 1-2 tablets by mouth every 6 (six) hours as needed for severe pain.  Dispense: 180 tablet; Refill: 0 -  oxyCODONE-acetaminophen (PERCOCET) 7.5-325 MG tablet; Take 1-2 tablets by mouth every 6 (six) hours as needed for severe pain.  Dispense: 180 tablet; Refill: 0 - oxyCODONE-acetaminophen (PERCOCET) 7.5-325 MG tablet; Take 1-2 tablets by mouth every 6 (six) hours as needed for severe pain.  Dispense: 180 tablet; Refill: 0 - methocarbamol (ROBAXIN) 500 MG tablet; Take 1 tablet (500 mg total) by mouth every 6 (six) hours as needed for muscle spasms.  Dispense: 90 tablet; Refill: 5 - gabapentin (NEURONTIN) 800 MG tablet; Take 1 tablet (800 mg total) by mouth 3 (three) times daily.  Dispense: 90 tablet; Refill: 5 - DULoxetine (CYMBALTA) 30 MG capsule; Take 3 capsules (90 mg total) by mouth daily.  Dispense: 90 capsule; Refill: 5  4. Irritability and anger - divalproex (DEPAKOTE) 250 MG DR tablet; Take 1 tablet (250 mg total) by mouth 2 (two) times daily.  Dispense: 60 tablet; Refill: 5   Start time: 9:14 AM End time: 9:28 AM  Meds ordered this encounter  Medications  . oxyCODONE-acetaminophen (PERCOCET) 7.5-325 MG tablet  Sig: Take 1-2 tablets by mouth every 6 (six) hours as needed for severe pain.    Dispense:  180 tablet    Refill:  0    Fill 60 days from original script date    Order Specific Question:   Supervising Provider    Answer:   Janora Norlander [[id number]]  . oxyCODONE-acetaminophen (PERCOCET) 7.5-325 MG tablet    Sig: Take 1-2 tablets by mouth every 6 (six) hours as needed for severe pain.    Dispense:  180 tablet    Refill:  0    Fill 30 days from original script date    Order Specific Question:   Supervising Provider    Answer:   Janora Norlander [[id number]]  . oxyCODONE-acetaminophen (PERCOCET) 7.5-325 MG tablet    Sig: Take 1-2 tablets by mouth every 6 (six) hours as needed for severe pain.    Dispense:  180 tablet    Refill:  0    Order Specific Question:   Supervising Provider    Answer:   Janora Norlander [[id number]]  . methocarbamol (ROBAXIN) 500 MG tablet     Sig: Take 1 tablet (500 mg total) by mouth every 6 (six) hours as needed for muscle spasms.    Dispense:  90 tablet    Refill:  5    $    Order Specific Question:   Supervising Provider    Answer:   Janora Norlander [[id number]]  . gabapentin (NEURONTIN) 800 MG tablet    Sig: Take 1 tablet (800 mg total) by mouth 3 (three) times daily.    Dispense:  90 tablet    Refill:  5    Order Specific Question:   Supervising Provider    Answer:   Janora Norlander [[id number]]  . DULoxetine (CYMBALTA) 30 MG capsule    Sig: Take 3 capsules (90 mg total) by mouth daily.    Dispense:  90 capsule    Refill:  5    Order Specific Question:   Supervising Provider    Answer:   Janora Norlander [[id number]]  . divalproex (DEPAKOTE) 250 MG DR tablet    Sig: Take 1 tablet (250 mg total) by mouth 2 (two) times daily.    Dispense:  60 tablet    Refill:  5    Order Specific Question:   Supervising Provider    Answer:   Janora Norlander [[id number]]    Particia Nearing PA-C [place] [phone]

## 2019-02-04 ENCOUNTER — Encounter: Payer: Self-pay | Admitting: Physician Assistant

## 2019-02-05 NOTE — Progress Notes (Signed)
Continue to wean from Percocet.  His total MME is 90 per day.  The wean should be by 10-15% per month.  Would recommend adjusting directions to reflect recommendations in reduction. UDS is appropriate and contract up to date.  Samarion Ehle M. Lajuana Ripple, [facility]

## 2019-04-01 ENCOUNTER — Other Ambulatory Visit: Payer: Self-pay | Admitting: Physician Assistant

## 2019-04-05 ENCOUNTER — Telehealth: Payer: Self-pay | Admitting: Physician Assistant

## 2019-04-05 NOTE — Telephone Encounter (Signed)
Diagnosis code given.

## 2019-04-16 ENCOUNTER — Other Ambulatory Visit: Payer: Self-pay

## 2019-04-19 ENCOUNTER — Encounter: Payer: Self-pay | Admitting: Physician Assistant

## 2019-04-19 ENCOUNTER — Ambulatory Visit (INDEPENDENT_AMBULATORY_CARE_PROVIDER_SITE_OTHER): Payer: Commercial Managed Care - PPO | Admitting: Physician Assistant

## 2019-04-19 ENCOUNTER — Other Ambulatory Visit: Payer: Self-pay

## 2019-04-19 VITALS — BP 127/87 | HR 94 | Temp 96.8°F | Ht 70.0 in | Wt 212.2 lb

## 2019-04-19 DIAGNOSIS — Z0000 Encounter for general adult medical examination without abnormal findings: Secondary | ICD-10-CM

## 2019-04-19 DIAGNOSIS — E1029 Type 1 diabetes mellitus with other diabetic kidney complication: Secondary | ICD-10-CM

## 2019-04-19 DIAGNOSIS — R809 Proteinuria, unspecified: Secondary | ICD-10-CM

## 2019-04-19 DIAGNOSIS — M48061 Spinal stenosis, lumbar region without neurogenic claudication: Secondary | ICD-10-CM

## 2019-04-19 DIAGNOSIS — M48062 Spinal stenosis, lumbar region with neurogenic claudication: Secondary | ICD-10-CM

## 2019-04-19 DIAGNOSIS — M5137 Other intervertebral disc degeneration, lumbosacral region: Secondary | ICD-10-CM

## 2019-04-19 LAB — BAYER DCA HB A1C WAIVED: HB A1C (BAYER DCA - WAIVED): 7.6 % — ABNORMAL HIGH (ref <7.0)

## 2019-04-19 MED ORDER — OXYCODONE-ACETAMINOPHEN 10-325 MG PO TABS: 1.0000 | ORAL_TABLET | Freq: Four times a day (QID) | ORAL | 0 refills | Status: DC | PRN

## 2019-04-19 MED ORDER — OXYCODONE-ACETAMINOPHEN 10-325 MG PO TABS: 1.0000 | ORAL_TABLET | Freq: Four times a day (QID) | ORAL | 0 refills | Status: AC | PRN

## 2019-04-19 MED ORDER — RAMIPRIL 10 MG PO CAPS: ORAL_CAPSULE | ORAL | 3 refills | Status: DC

## 2019-04-19 MED ORDER — AMLODIPINE BESYLATE 5 MG PO TABS: 5.0000 mg | ORAL_TABLET | Freq: Every day | ORAL | 3 refills | Status: DC

## 2019-04-19 NOTE — Addendum Note (Signed)
Addended by: Terald Sleeper on: 04/19/2019 03:32 PM   Modules accepted: Orders

## 2019-04-19 NOTE — Progress Notes (Signed)
BP 127/87   Pulse 94   Temp (!) 96.8 F (36 C) (Temporal)   Ht '5\' 10"'  (1.778 m)   Wt 212 lb 3.2 oz (96.3 kg)   BMI 30.45 kg/m    Subjective:    Patient ID: John Ware, male    DOB: [birth year], 53 y.o.   MRN: [id number]  HPI: John Ware is a 53 y.o. male presenting on 04/19/2019 for Pain (2 month follow up )  PAIN ASSESSMENT: Cause of pain-degenerative disc disease throughout spine and spinal stenosis  MRI2018 1. Multifactorial severe spinal canal and bilateral neural foraminal stenosis at L4-L5. Multifactorial severe bilateral neural foraminal stenosis at L5-S1. Multi tectorial moderate spinal canal stenosis and moderate right, severe left neural foraminal stenosis at L3-L4.   Current medications-Percocet 7.5/325 mg 1 to 2 tablets 3 times a day.    At this point the patient had consistently been taking 6 tablets a day.  For a total of 45 mg.  He really feels he cannot go much lower with the amount of pain that he still has some things.  We have discussed switching him over to Percocet 10 mg and taking one tab 4 times a day and this will scrape off just a little bit more of the medication.  He does feel that he can try this.  Medication will be sent  Pain on scale of 1-10-7 Frequency-daily What increases pain-standing an dlifting What makes pain Better-rest Effects on ADL -mild Any change in general medical condition-no  Effectiveness of current meds-good Adverse reactions form pain meds-no PMP AWARE website reviewed:Yes Any suspicious activity on PMP Aware:No MME daily dose:67.5 MME A plan to go to 37 MME this visit  Contract on file 12/08/2018 Last UDS4/02/2019  [facility] script sent or current  History of overdose or risk of abuseno  Past Medical History:  Diagnosis Date  . Depression   . Diabetes mellitus without complication ([place])   . Hypertension   . Spinal stenosis    With Neurogenic Claudication   Relevant past medical,  surgical, family and social history reviewed and updated as indicated. Interim medical history since our last visit reviewed. Allergies and medications reviewed and updated. DATA REVIEWED: CHART IN EPIC  Family History reviewed for pertinent findings.  Review of Systems  Constitutional: Negative.  Negative for appetite change and fatigue.  Eyes: Negative for pain and visual disturbance.  Respiratory: Negative.  Negative for cough, chest tightness, shortness of breath and wheezing.   Cardiovascular: Negative.  Negative for chest pain, palpitations and leg swelling.  Gastrointestinal: Negative.  Negative for abdominal pain, diarrhea, nausea and vomiting.  Genitourinary: Negative.   Musculoskeletal: Positive for arthralgias, back pain, joint swelling and myalgias.  Skin: Negative.  Negative for color change and rash.  Neurological: Negative.  Negative for weakness, numbness and headaches.  Psychiatric/Behavioral: Negative.     Allergies as of 04/19/2019      Reactions   Tizanidine    Leg cramps      Medication List       Accurate as of April 19, 2019 10:15 AM. If you have any questions, ask your nurse or doctor.        STOP taking these medications   oxyCODONE-acetaminophen 7.5-325 MG tablet Commonly known as: Percocet Replaced by: oxyCODONE-acetaminophen 10-325 MG tablet Stopped by: Terald Sleeper, PA-C   oxyCODONE-acetaminophen 7.5-325 MG tablet Commonly known as: Percocet Replaced by: oxyCODONE-acetaminophen 10-325 MG tablet Stopped by: Terald Sleeper, PA-C   oxyCODONE-acetaminophen  7.5-325 MG tablet Commonly known as: Percocet Replaced by: oxyCODONE-acetaminophen 10-325 MG tablet Stopped by: Terald Sleeper, PA-C     TAKE these medications   amLODipine 5 MG tablet Commonly known as: NORVASC Take 1 tablet (5 mg total) by mouth daily.   buPROPion 150 MG 24 hr tablet Commonly known as: WELLBUTRIN XL TAKE (1) TABLET TWICE A DAY.   divalproex 250 MG DR tablet  Commonly known as: DEPAKOTE Take 1 tablet (250 mg total) by mouth 2 (two) times daily.   DULoxetine 30 MG capsule Commonly known as: CYMBALTA Take 3 capsules (90 mg total) by mouth daily.   fluticasone 50 MCG/ACT nasal spray Commonly known as: FLONASE Place 2 sprays into both nostrils daily.   gabapentin 800 MG tablet Commonly known as: NEURONTIN Take 1 tablet (800 mg total) by mouth 3 (three) times daily.   guaiFENesin 600 MG 12 hr tablet Commonly known as: MUCINEX Take 600 mg by mouth 2 (two) times daily as needed (congestion).   metFORMIN 500 MG tablet Commonly known as: GLUCOPHAGE Take 1 tablet (500 mg total) by mouth 3 (three) times daily.   methocarbamol 500 MG tablet Commonly known as: ROBAXIN Take 1 tablet (500 mg total) by mouth every 6 (six) hours as needed for muscle spasms.   oxyCODONE-acetaminophen 10-325 MG tablet Commonly known as: PERCOCET Take 1 tablet by mouth every 6 (six) hours as needed for pain. Replaces: oxyCODONE-acetaminophen 7.5-325 MG tablet Started by: Terald Sleeper, PA-C   oxyCODONE-acetaminophen 10-325 MG tablet Commonly known as: PERCOCET Take 1 tablet by mouth every 6 (six) hours as needed for pain. Replaces: oxyCODONE-acetaminophen 7.5-325 MG tablet Started by: Terald Sleeper, PA-C   oxyCODONE-acetaminophen 10-325 MG tablet Commonly known as: PERCOCET Take 1 tablet by mouth every 6 (six) hours as needed for pain. Replaces: oxyCODONE-acetaminophen 7.5-325 MG tablet Started by: Terald Sleeper, PA-C   ramipril 10 MG capsule Commonly known as: ALTACE TAKE (1) CAPSULE DAILY          Objective:    BP 127/87   Pulse 94   Temp (!) 96.8 F (36 C) (Temporal)   Ht '5\' 10"'  (1.778 m)   Wt 212 lb 3.2 oz (96.3 kg)   BMI 30.45 kg/m   Allergies  Allergen Reactions  . Tizanidine     Leg cramps    Wt Readings from Last 3 Encounters:  04/19/19 212 lb 3.2 oz (96.3 kg)  12/01/18 206 lb 3.2 oz (93.5 kg)  10/30/18 211 lb (95.7 kg)     Physical Exam Vitals signs and nursing note reviewed.  Constitutional:      General: He is not in acute distress.    Appearance: He is well-developed.  HENT:     Head: Normocephalic and atraumatic.  Eyes:     Conjunctiva/sclera: Conjunctivae normal.     Pupils: Pupils are equal, round, and reactive to light.  Cardiovascular:     Rate and Rhythm: Normal rate and regular rhythm.     Heart sounds: Normal heart sounds.  Pulmonary:     Effort: Pulmonary effort is normal. No respiratory distress.     Breath sounds: Normal breath sounds.  Skin:    General: Skin is warm and dry.  Psychiatric:        Behavior: Behavior normal.         Assessment & Plan:   1. DDD (degenerative disc disease), lumbosacral - oxyCODONE-acetaminophen (PERCOCET) 10-325 MG tablet; Take 1 tablet by mouth every 6 (six) hours as  needed for pain.  Dispense: 120 tablet; Refill: 0 - oxyCODONE-acetaminophen (PERCOCET) 10-325 MG tablet; Take 1 tablet by mouth every 6 (six) hours as needed for pain.  Dispense: 120 tablet; Refill: 0 - oxyCODONE-acetaminophen (PERCOCET) 10-325 MG tablet; Take 1 tablet by mouth every 6 (six) hours as needed for pain.  Dispense: 120 tablet; Refill: 0  2. Spinal stenosis of lumbar region with neurogenic claudication - oxyCODONE-acetaminophen (PERCOCET) 10-325 MG tablet; Take 1 tablet by mouth every 6 (six) hours as needed for pain.  Dispense: 120 tablet; Refill: 0 - oxyCODONE-acetaminophen (PERCOCET) 10-325 MG tablet; Take 1 tablet by mouth every 6 (six) hours as needed for pain.  Dispense: 120 tablet; Refill: 0 - oxyCODONE-acetaminophen (PERCOCET) 10-325 MG tablet; Take 1 tablet by mouth every 6 (six) hours as needed for pain.  Dispense: 120 tablet; Refill: 0  3. Spinal stenosis of lumbar region, unspecified whether neurogenic claudication present - oxyCODONE-acetaminophen (PERCOCET) 10-325 MG tablet; Take 1 tablet by mouth every 6 (six) hours as needed for pain.  Dispense: 120 tablet;  Refill: 0 - oxyCODONE-acetaminophen (PERCOCET) 10-325 MG tablet; Take 1 tablet by mouth every 6 (six) hours as needed for pain.  Dispense: 120 tablet; Refill: 0 - oxyCODONE-acetaminophen (PERCOCET) 10-325 MG tablet; Take 1 tablet by mouth every 6 (six) hours as needed for pain.  Dispense: 120 tablet; Refill: 0  4. Well adult exam - CBC with Differential/Platelet - CMP14+EGFR - Lipid panel - TSH - Bayer DCA Hb A1c Waived - PSA  5. 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  Follow up plan: Return in about 3 months (around 07/20/2019).  Educational handout given for [place] PA-C [place] [address]  [place], [place] [address] [phone]   04/19/2019, 10:15 AM

## 2019-04-20 LAB — CBC WITH DIFFERENTIAL/PLATELET
Basophils Absolute: 0 10*3/uL (ref 0.0–0.2)
Basos: 1 %
EOS (ABSOLUTE): 0.2 10*3/uL (ref 0.0–0.4)
Eos: 3 %
Hematocrit: 41 % (ref 37.5–51.0)
Hemoglobin: 14.2 g/dL (ref 13.0–17.7)
Immature Grans (Abs): 0 10*3/uL (ref 0.0–0.1)
Immature Granulocytes: 0 %
Lymphocytes Absolute: 1.8 10*3/uL (ref 0.7–3.1)
Lymphs: 22 %
MCH: 31.6 pg (ref 26.6–33.0)
MCHC: 34.6 g/dL (ref 31.5–35.7)
MCV: 91 fL (ref 79–97)
Monocytes Absolute: 0.6 10*3/uL (ref 0.1–0.9)
Monocytes: 8 %
Neutrophils Absolute: 5.3 10*3/uL (ref 1.4–7.0)
Neutrophils: 66 %
Platelets: 328 10*3/uL (ref 150–450)
RBC: 4.49 x10E6/uL (ref 4.14–5.80)
RDW: 11.8 % (ref 11.6–15.4)
WBC: 8 10*3/uL (ref 3.4–10.8)

## 2019-04-20 LAB — CMP14+EGFR
ALT: 20 IU/L (ref 0–44)
AST: 16 IU/L (ref 0–40)
Albumin/Globulin Ratio: 1.9 (ref 1.2–2.2)
Albumin: 4.3 g/dL (ref 3.8–4.9)
Alkaline Phosphatase: 96 IU/L (ref 39–117)
BUN/Creatinine Ratio: 15 (ref 9–20)
BUN: 13 mg/dL (ref 6–24)
Bilirubin Total: 0.2 mg/dL (ref 0.0–1.2)
CO2: 25 mmol/L (ref 20–29)
Calcium: 9.5 mg/dL (ref 8.7–10.2)
Chloride: 98 mmol/L (ref 96–106)
Creatinine, Ser: 0.84 mg/dL (ref 0.76–1.27)
GFR calc Af Amer: 117 mL/min/{1.73_m2} (ref >59)
GFR calc non Af Amer: 101 mL/min/{1.73_m2} (ref >59)
Globulin, Total: 2.3 g/dL (ref 1.5–4.5)
Glucose: 198 mg/dL — ABNORMAL HIGH (ref 65–99)
Potassium: 4.6 mmol/L (ref 3.5–5.2)
Sodium: 139 mmol/L (ref 134–144)
Total Protein: 6.6 g/dL (ref 6.0–8.5)

## 2019-04-20 LAB — LIPID PANEL
Chol/HDL Ratio: 3.2 ratio (ref 0.0–5.0)
Cholesterol, Total: 171 mg/dL (ref 100–199)
HDL: 54 mg/dL (ref >39)
LDL Calculated: 86 mg/dL (ref 0–99)
Triglycerides: 157 mg/dL — ABNORMAL HIGH (ref 0–149)
VLDL Cholesterol Cal: 31 mg/dL (ref 5–40)

## 2019-04-20 LAB — MICROALBUMIN / CREATININE URINE RATIO
Creatinine, Urine: 165.2 mg/dL
Microalb/Creat Ratio: 51 mg/g creat — ABNORMAL HIGH (ref 0–29)
Microalbumin, Urine: 84.7 ug/mL

## 2019-04-20 LAB — PSA: Prostate Specific Ag, Serum: 0.6 ng/mL (ref 0.0–4.0)

## 2019-04-20 LAB — TSH: TSH: 1.18 u[IU]/mL (ref 0.450–4.500)

## 2019-04-30 ENCOUNTER — Other Ambulatory Visit: Payer: Self-pay | Admitting: Physician Assistant

## 2019-05-29 ENCOUNTER — Other Ambulatory Visit: Payer: Self-pay | Admitting: Physician Assistant

## 2019-07-16 ENCOUNTER — Other Ambulatory Visit: Payer: Self-pay | Admitting: Physician Assistant

## 2019-07-16 DIAGNOSIS — M48061 Spinal stenosis, lumbar region without neurogenic claudication: Secondary | ICD-10-CM

## 2019-07-16 DIAGNOSIS — M48062 Spinal stenosis, lumbar region with neurogenic claudication: Secondary | ICD-10-CM

## 2019-07-16 DIAGNOSIS — M5137 Other intervertebral disc degeneration, lumbosacral region: Secondary | ICD-10-CM

## 2019-07-19 ENCOUNTER — Ambulatory Visit (INDEPENDENT_AMBULATORY_CARE_PROVIDER_SITE_OTHER): Payer: Commercial Managed Care - PPO | Admitting: Physician Assistant

## 2019-07-19 ENCOUNTER — Encounter: Payer: Self-pay | Admitting: Physician Assistant

## 2019-07-19 DIAGNOSIS — M5137 Other intervertebral disc degeneration, lumbosacral region: Secondary | ICD-10-CM

## 2019-07-19 DIAGNOSIS — M48062 Spinal stenosis, lumbar region with neurogenic claudication: Secondary | ICD-10-CM | POA: Diagnosis not present

## 2019-07-19 DIAGNOSIS — M48061 Spinal stenosis, lumbar region without neurogenic claudication: Secondary | ICD-10-CM | POA: Diagnosis not present

## 2019-07-19 MED ORDER — OXYCODONE-ACETAMINOPHEN 10-325 MG PO TABS: 1.0000 | ORAL_TABLET | Freq: Three times a day (TID) | ORAL | 0 refills | Status: DC | PRN

## 2019-07-19 NOTE — Progress Notes (Signed)
Telephone visit  Subjective: OV:FIEPPIR pain PCP: Remus Loffler, PA-C JJO:ACZYSA John Ware is a 53 y.o. male calls for telephone consult today. Patient provides verbal consent for consult held via phone.  Patient is identified with 2 separate identifiers.  At this time the entire area is on COVID-19 social distancing and stay home orders are in place.  Patient is of higher risk and therefore we are performing this by a virtual method.  Location of patient: Home Location of provider: HOME Others present for call: No   Patient is willing to reduce his pain medication to under the 50 MME mark.  We will just reduce his tablet by 1/day.  If things do not improve or he has a 2 difficult of a time doing that we will plan for referral to [facility] pain clinic on [facility].   PAIN ASSESSMENT: Cause of pain-degenerative disc disease throughout spine and spinal stenosis  MRI2018 1. Multifactorial severe spinal canal and bilateral neural foraminal stenosis at L4-L5. Multifactorial severe bilateral neural foraminal stenosis at L5-S1. Multi tectorial moderate spinal canal stenosis and moderate right, severe left neural foraminal stenosis at L3-L4.   Current medications-Percocet 10/325 mg 1 QID Gabapentin 800 mg TID for pain  Pain on scale of 1-10-7 Frequency-daily What increases pain-standing an dlifting What makes pain Better-rest Effects on ADL -mild Any change in general medical condition-no  Effectiveness of current meds-good Adverse reactions form pain meds-no PMP AWARE website reviewed:Yes Any suspicious activity on PMP Aware:No MME daily dose:60 MME, new 45 MME.  Contract on file 12/08/2018 Last UDS4/02/2019  [facility] script sent or current    ROS: Per HPI  Allergies  Allergen Reactions  . Tizanidine     Leg cramps   Past Medical History:  Diagnosis Date  . Depression   . Diabetes mellitus without complication (HCC)   .  Hypertension   . Spinal stenosis    With Neurogenic Claudication    Current Outpatient Medications:  .  amLODipine (NORVASC) 5 MG tablet, Take 1 tablet (5 mg total) by mouth daily., Disp: 90 tablet, Rfl: 3 .  buPROPion (WELLBUTRIN XL) 150 MG 24 hr tablet, TAKE (1) TABLET TWICE A DAY., Disp: 60 tablet, Rfl: 1 .  divalproex (DEPAKOTE) 250 MG DR tablet, Take 1 tablet (250 mg total) by mouth 2 (two) times daily., Disp: 60 tablet, Rfl: 5 .  DULoxetine (CYMBALTA) 30 MG capsule, Take 3 capsules (90 mg total) by mouth daily., Disp: 90 capsule, Rfl: 5 .  fluticasone (FLONASE) 50 MCG/ACT nasal spray, Place 2 sprays into both nostrils daily., Disp: 16 g, Rfl: 6 .  gabapentin (NEURONTIN) 800 MG tablet, Take 1 tablet (800 mg total) by mouth 3 (three) times daily., Disp: 90 tablet, Rfl: 5 .  guaiFENesin (MUCINEX) 600 MG 12 hr tablet, Take 600 mg by mouth 2 (two) times daily as needed (congestion)., Disp: , Rfl:  .  metFORMIN (GLUCOPHAGE) 500 MG tablet, TAKE 1 TABLET 3 TIMES A DAY, Disp: 90 tablet, Rfl: 1 .  methocarbamol (ROBAXIN) 500 MG tablet, Take 1 tablet (500 mg total) by mouth every 6 (six) hours as needed for muscle spasms., Disp: 90 tablet, Rfl: 5 .  oxyCODONE-acetaminophen (PERCOCET) 10-325 MG tablet, Take 1 tablet by mouth every 8 (eight) hours as needed for pain., Disp: 90 tablet, Rfl: 0 .  oxyCODONE-acetaminophen (PERCOCET) 10-325 MG tablet, Take 1 tablet by mouth every 8 (eight) hours as needed for pain., Disp: 90 tablet, Rfl: 0 .  oxyCODONE-acetaminophen (  PERCOCET) 10-325 MG tablet, Take 1 tablet by mouth every 8 (eight) hours as needed for pain., Disp: 90 tablet, Rfl: 0 .  ramipril (ALTACE) 10 MG capsule, TAKE (1) CAPSULE DAILY, Disp: 90 capsule, Rfl: 3  Assessment/ Plan: 53 y.o. male   1. DDD (degenerative disc disease), lumbosacral - oxyCODONE-acetaminophen (PERCOCET) 10-325 MG tablet; Take 1 tablet by mouth every 8 (eight) hours as needed for pain.  Dispense: 90 tablet; Refill: 0  2.  Spinal stenosis of lumbar region with neurogenic claudication - oxyCODONE-acetaminophen (PERCOCET) 10-325 MG tablet; Take 1 tablet by mouth every 8 (eight) hours as needed for pain.  Dispense: 90 tablet; Refill: 0  3. Spinal stenosis of lumbar region, unspecified whether neurogenic claudication present - oxyCODONE-acetaminophen (PERCOCET) 10-325 MG tablet; Take 1 tablet by mouth every 8 (eight) hours as needed for pain.  Dispense: 90 tablet; Refill: 0   recheck 12 weeks  Continue all other maintenance medications as listed above.  Start time: 2:30 PM End time: 2:39 PM  Meds ordered this encounter  Medications  . oxyCODONE-acetaminophen (PERCOCET) 10-325 MG tablet    Sig: Take 1 tablet by mouth every 8 (eight) hours as needed for pain.    Dispense:  90 tablet    Refill:  0    Order Specific Question:   Supervising Provider    Answer:   Janora Norlander [[id number]]  . oxyCODONE-acetaminophen (PERCOCET) 10-325 MG tablet    Sig: Take 1 tablet by mouth every 8 (eight) hours as needed for pain.    Dispense:  90 tablet    Refill:  0    Order Specific Question:   Supervising Provider    Answer:   Janora Norlander [[id number]]  . oxyCODONE-acetaminophen (PERCOCET) 10-325 MG tablet    Sig: Take 1 tablet by mouth every 8 (eight) hours as needed for pain.    Dispense:  90 tablet    Refill:  0    Order Specific Question:   Supervising Provider    Answer:   Janora Norlander [[id number]]    Particia Nearing PA-C [place] [phone]

## 2019-07-20 ENCOUNTER — Ambulatory Visit: Payer: Commercial Managed Care - PPO | Admitting: Physician Assistant

## 2019-07-27 ENCOUNTER — Other Ambulatory Visit: Payer: Self-pay | Admitting: Physician Assistant

## 2019-07-27 DIAGNOSIS — M48061 Spinal stenosis, lumbar region without neurogenic claudication: Secondary | ICD-10-CM

## 2019-07-27 DIAGNOSIS — M48062 Spinal stenosis, lumbar region with neurogenic claudication: Secondary | ICD-10-CM

## 2019-07-27 DIAGNOSIS — M5137 Other intervertebral disc degeneration, lumbosacral region: Secondary | ICD-10-CM

## 2019-07-30 ENCOUNTER — Other Ambulatory Visit: Payer: Self-pay | Admitting: Physician Assistant

## 2019-07-30 DIAGNOSIS — M5137 Other intervertebral disc degeneration, lumbosacral region: Secondary | ICD-10-CM

## 2019-07-30 DIAGNOSIS — M48062 Spinal stenosis, lumbar region with neurogenic claudication: Secondary | ICD-10-CM

## 2019-07-30 DIAGNOSIS — M5136 Other intervertebral disc degeneration, lumbar region: Secondary | ICD-10-CM

## 2019-08-25 ENCOUNTER — Other Ambulatory Visit: Payer: Self-pay | Admitting: Physician Assistant

## 2019-08-25 DIAGNOSIS — M48062 Spinal stenosis, lumbar region with neurogenic claudication: Secondary | ICD-10-CM

## 2019-08-25 DIAGNOSIS — M5137 Other intervertebral disc degeneration, lumbosacral region: Secondary | ICD-10-CM

## 2019-08-25 DIAGNOSIS — M48061 Spinal stenosis, lumbar region without neurogenic claudication: Secondary | ICD-10-CM

## 2019-08-25 DIAGNOSIS — R454 Irritability and anger: Secondary | ICD-10-CM

## 2019-09-24 ENCOUNTER — Other Ambulatory Visit: Payer: Self-pay | Admitting: Physician Assistant

## 2019-09-24 DIAGNOSIS — M48061 Spinal stenosis, lumbar region without neurogenic claudication: Secondary | ICD-10-CM

## 2019-09-24 DIAGNOSIS — M5137 Other intervertebral disc degeneration, lumbosacral region: Secondary | ICD-10-CM

## 2019-09-24 DIAGNOSIS — R454 Irritability and anger: Secondary | ICD-10-CM

## 2019-09-24 DIAGNOSIS — M48062 Spinal stenosis, lumbar region with neurogenic claudication: Secondary | ICD-10-CM

## 2019-10-25 ENCOUNTER — Other Ambulatory Visit: Payer: Self-pay | Admitting: Physician Assistant

## 2019-10-25 DIAGNOSIS — M5137 Other intervertebral disc degeneration, lumbosacral region: Secondary | ICD-10-CM

## 2019-10-25 DIAGNOSIS — M51379 Other intervertebral disc degeneration, lumbosacral region without mention of lumbar back pain or lower extremity pain: Secondary | ICD-10-CM

## 2019-10-25 DIAGNOSIS — M48061 Spinal stenosis, lumbar region without neurogenic claudication: Secondary | ICD-10-CM

## 2019-10-25 DIAGNOSIS — M48062 Spinal stenosis, lumbar region with neurogenic claudication: Secondary | ICD-10-CM

## 2019-10-25 DIAGNOSIS — R454 Irritability and anger: Secondary | ICD-10-CM

## 2019-11-25 ENCOUNTER — Other Ambulatory Visit: Payer: Self-pay | Admitting: Family Medicine

## 2019-11-25 DIAGNOSIS — M48062 Spinal stenosis, lumbar region with neurogenic claudication: Secondary | ICD-10-CM

## 2019-11-25 DIAGNOSIS — M48061 Spinal stenosis, lumbar region without neurogenic claudication: Secondary | ICD-10-CM

## 2019-11-25 DIAGNOSIS — M5137 Other intervertebral disc degeneration, lumbosacral region: Secondary | ICD-10-CM

## 2019-11-25 NOTE — Telephone Encounter (Signed)
Okay to approve x1 month.  Please make sure he has an appointment to establish care with new provider as he is overdue for diabetic check

## 2019-11-29 MED ORDER — BUPROPION HCL ER (XL) 150 MG PO TB24: ORAL_TABLET | ORAL | 0 refills | Status: DC

## 2019-11-29 MED ORDER — METHOCARBAMOL 500 MG PO TABS: ORAL_TABLET | ORAL | 0 refills | Status: DC

## 2019-11-29 MED ORDER — GABAPENTIN 800 MG PO TABS: ORAL_TABLET | ORAL | 0 refills | Status: DC

## 2020-02-10 ENCOUNTER — Encounter: Payer: Self-pay | Admitting: Family

## 2020-02-10 ENCOUNTER — Ambulatory Visit (INDEPENDENT_AMBULATORY_CARE_PROVIDER_SITE_OTHER): Payer: Commercial Managed Care - PPO | Admitting: Family

## 2020-02-10 DIAGNOSIS — I10 Essential (primary) hypertension: Secondary | ICD-10-CM

## 2020-02-10 DIAGNOSIS — M5136 Other intervertebral disc degeneration, lumbar region: Secondary | ICD-10-CM | POA: Diagnosis not present

## 2020-02-10 DIAGNOSIS — F339 Major depressive disorder, recurrent, unspecified: Secondary | ICD-10-CM

## 2020-02-10 DIAGNOSIS — E1169 Type 2 diabetes mellitus with other specified complication: Secondary | ICD-10-CM

## 2020-02-10 DIAGNOSIS — I152 Hypertension secondary to endocrine disorders: Secondary | ICD-10-CM

## 2020-02-10 DIAGNOSIS — E1159 Type 2 diabetes mellitus with other circulatory complications: Secondary | ICD-10-CM | POA: Diagnosis not present

## 2020-02-10 DIAGNOSIS — E785 Hyperlipidemia, unspecified: Secondary | ICD-10-CM

## 2020-02-10 MED ORDER — BUPROPION HCL ER (XL) 150 MG PO TB24: ORAL_TABLET | ORAL | 0 refills | Status: DC

## 2020-02-10 MED ORDER — SERTRALINE HCL 100 MG PO TABS: 100.0000 mg | ORAL_TABLET | Freq: Every day | ORAL | 5 refills | Status: DC

## 2020-02-10 MED ORDER — METFORMIN HCL 500 MG PO TABS: 500.0000 mg | ORAL_TABLET | Freq: Three times a day (TID) | ORAL | 1 refills | Status: DC

## 2020-02-10 MED ORDER — RAMIPRIL 10 MG PO CAPS: ORAL_CAPSULE | ORAL | 3 refills | Status: DC

## 2020-02-10 MED ORDER — SERTRALINE HCL 50 MG PO TABS: ORAL_TABLET | ORAL | 5 refills | Status: DC

## 2020-02-10 MED ORDER — AMLODIPINE BESYLATE 5 MG PO TABS: 5.0000 mg | ORAL_TABLET | Freq: Every day | ORAL | 3 refills | Status: DC

## 2020-02-10 NOTE — Progress Notes (Signed)
Virtual Visit via telephone Note Due to COVID-19 pandemic this visit was conducted virtually. This visit type was conducted due to national recommendations for restrictions regarding the COVID-19 Pandemic (e.g. social distancing, sheltering in place) in an effort to limit this patient's exposure and mitigate transmission in our community. All issues noted in this document were discussed and addressed.  A physical exam was not performed with this format.  I connected with John Ware on 02/10/20 at 2:00 pm  by telephone and verified that I am speaking with the correct person using two identifiers. John Ware is currently located at work and no one is currently with him  during visit. The provider, Evelina Dun, FNP is located in their office at time of visit.  I discussed the limitations, risks, security and privacy concerns of performing an evaluation and management service by telephone and the availability of in person appointments. I also discussed with the patient that there may be a patient responsible charge related to this service. The patient expressed understanding and agreed to proceed.   History and Present Illness:  PT calls the office today for chronic follow up. He is followed by [facility] month.  Diabetes He presents for his follow-up diabetic visit. He has type 2 diabetes mellitus. His disease course has been stable. There are no hypoglycemic associated symptoms. Associated symptoms include foot paresthesias. Pertinent negatives for diabetes include no blurred vision. Symptoms are stable. Diabetic complications include peripheral neuropathy. Pertinent negatives for diabetic complications include no CVA or heart disease. Risk factors for coronary artery disease include dyslipidemia, diabetes mellitus, male sex, hypertension and sedentary lifestyle. He is following a generally healthy diet. His overall blood glucose range is 110-130 mg/dl. An ACE inhibitor/angiotensin  II receptor blocker is being taken. Eye exam is not current.  Hyperlipidemia This is a chronic problem. The current episode started more than 1 year ago. The problem is controlled. Recent lipid tests were reviewed and are normal. Exacerbating diseases include obesity. Pertinent negatives include no shortness of breath. He is currently on no antihyperlipidemic treatment. The current treatment provides no improvement of lipids. Risk factors for coronary artery disease include dyslipidemia, diabetes mellitus, male sex, a sedentary lifestyle and post-menopausal.  Hypertension This is a chronic problem. The current episode started more than 1 year ago. The problem has been resolved since onset. The problem is controlled. Associated symptoms include malaise/fatigue. Pertinent negatives include no blurred vision, peripheral edema or shortness of breath. Risk factors for coronary artery disease include diabetes mellitus, male gender, sedentary lifestyle and obesity. The current treatment provides moderate improvement. There is no history of CVA.  Depression        This is a chronic problem.  The current episode started more than 1 year ago.   Associated symptoms include helplessness, hopelessness, irritable and restlessness.  Past treatments include SNRIs - Serotonin and norepinephrine reuptake inhibitors.  Compliance with treatment is good.     Review of Systems  Constitutional: Positive for malaise/fatigue.  Eyes: Negative for blurred vision.  Respiratory: Negative for shortness of breath.   Psychiatric/Behavioral: Positive for depression.  All other systems reviewed and are negative.    Observations/Objective: No SOB or distress noted   Assessment and Plan: John Ware comes in today with chief complaint of No chief complaint on file.   Diagnosis and orders addressed:  1. Type 2 diabetes mellitus with other specified complication, without long-term current use of insulin (HCC) -  metFORMIN (GLUCOPHAGE) 500 MG  tablet; Take 1 tablet (500 mg total) by mouth 3 (three) times daily.  Dispense: 90 tablet; Refill: 1 - ramipril (ALTACE) 10 MG capsule; TAKE (1) CAPSULE DAILY  Dispense: 90 capsule; Refill: 3 - Bayer DCA Hb A1c Waived - CMP14+EGFR - CBC with Differential/Platelet - Microalbumin / creatinine urine ratio  2. Hyperlipidemia associated with type 2 diabetes mellitus ([place]) - CMP14+EGFR - CBC with Differential/Platelet - Lipid panel  3. DDD (degenerative disc disease), lumbar - CMP14+EGFR - CBC with Differential/Platelet  4. Depression, recurrent ([place]) Will stop Cymbalta and start Zoloft. He has not had his Cymbalta for 2 months - buPROPion (WELLBUTRIN XL) 150 MG 24 hr tablet; TAKE (1) TABLET TWICE A DAY.  Dispense: 30 tablet; Refill: 0 - sertraline (ZOLOFT) 50 MG tablet; Take 1 tablet (50 mg total) by mouth daily for 14 days, THEN 2 tablets (100 mg total) daily for 14 days.  Dispense: 30 tablet; Refill: 5 - sertraline (ZOLOFT) 100 MG tablet; Take 1 tablet (100 mg total) by mouth daily.  Dispense: 30 tablet; Refill: 5 - CMP14+EGFR - CBC with Differential/Platelet  5. Hypertension associated with diabetes ([place]) - amLODipine (NORVASC) 5 MG tablet; Take 1 tablet (5 mg total) by mouth daily.  Dispense: 90 tablet; Refill: 3 - ramipril (ALTACE) 10 MG capsule; TAKE (1) CAPSULE DAILY  Dispense: 90 capsule; Refill: 3 - CMP14+EGFR - CBC with Differential/Platelet   Labs pending Health Maintenance reviewed Diet and exercise encouraged  Follow up plan: 6 weeks to recheck depression       I discussed the assessment and treatment plan with the patient. The patient was provided an opportunity to ask questions and all were answered. The patient agreed with the plan and demonstrated an understanding of the instructions.   The patient was advised to call back or seek an in-person evaluation if the symptoms worsen or if the condition fails to improve as  anticipated.  The above assessment and management plan was discussed with the patient. The patient verbalized understanding of and has agreed to the management plan. Patient is aware to call the clinic if symptoms persist or worsen. Patient is aware when to return to the clinic for a follow-up visit. Patient educated on when it is appropriate to go to the emergency department.   Time call ended:  2:15 pm   I provided  15 minutes of non-face-to-face time during this encounter.    Evelina Dun, FNP

## 2020-02-14 ENCOUNTER — Other Ambulatory Visit: Payer: Self-pay | Admitting: Family Medicine

## 2020-02-14 DIAGNOSIS — M48062 Spinal stenosis, lumbar region with neurogenic claudication: Secondary | ICD-10-CM

## 2020-02-14 DIAGNOSIS — M5137 Other intervertebral disc degeneration, lumbosacral region: Secondary | ICD-10-CM

## 2020-02-14 DIAGNOSIS — M51379 Other intervertebral disc degeneration, lumbosacral region without mention of lumbar back pain or lower extremity pain: Secondary | ICD-10-CM

## 2020-02-14 DIAGNOSIS — M48061 Spinal stenosis, lumbar region without neurogenic claudication: Secondary | ICD-10-CM

## 2020-02-15 MED ORDER — GABAPENTIN 800 MG PO TABS: ORAL_TABLET | ORAL | 0 refills | Status: DC

## 2020-02-25 ENCOUNTER — Other Ambulatory Visit: Payer: Commercial Managed Care - PPO

## 2020-02-25 LAB — BAYER DCA HB A1C WAIVED: HB A1C (BAYER DCA - WAIVED): 9.3 % — ABNORMAL HIGH (ref <7.0)

## 2020-02-26 LAB — LIPID PANEL
Chol/HDL Ratio: 3.9 ratio (ref 0.0–5.0)
Cholesterol, Total: 194 mg/dL (ref 100–199)
HDL: 50 mg/dL (ref >39)
LDL Chol Calc (NIH): 112 mg/dL — ABNORMAL HIGH (ref 0–99)
Triglycerides: 186 mg/dL — ABNORMAL HIGH (ref 0–149)
VLDL Cholesterol Cal: 32 mg/dL (ref 5–40)

## 2020-02-26 LAB — CBC WITH DIFFERENTIAL/PLATELET
Basophils Absolute: 0.1 10*3/uL (ref 0.0–0.2)
Basos: 1 %
EOS (ABSOLUTE): 0.1 10*3/uL (ref 0.0–0.4)
Eos: 2 %
Hematocrit: 43.3 % (ref 37.5–51.0)
Hemoglobin: 15.3 g/dL (ref 13.0–17.7)
Immature Grans (Abs): 0 10*3/uL (ref 0.0–0.1)
Immature Granulocytes: 0 %
Lymphocytes Absolute: 1.6 10*3/uL (ref 0.7–3.1)
Lymphs: 21 %
MCH: 32.1 pg (ref 26.6–33.0)
MCHC: 35.3 g/dL (ref 31.5–35.7)
MCV: 91 fL (ref 79–97)
Monocytes Absolute: 0.6 10*3/uL (ref 0.1–0.9)
Monocytes: 8 %
Neutrophils Absolute: 5.3 10*3/uL (ref 1.4–7.0)
Neutrophils: 68 %
Platelets: 322 10*3/uL (ref 150–450)
RBC: 4.77 x10E6/uL (ref 4.14–5.80)
RDW: 11.1 % — ABNORMAL LOW (ref 11.6–15.4)
WBC: 7.7 10*3/uL (ref 3.4–10.8)

## 2020-02-26 LAB — MICROALBUMIN / CREATININE URINE RATIO
Creatinine, Urine: 188.3 mg/dL
Microalb/Creat Ratio: 161 mg/g creat — ABNORMAL HIGH (ref 0–29)
Microalbumin, Urine: 302.8 ug/mL

## 2020-02-26 LAB — CMP14+EGFR
ALT: 34 IU/L (ref 0–44)
AST: 22 IU/L (ref 0–40)
Albumin/Globulin Ratio: 1.9 (ref 1.2–2.2)
Albumin: 4.9 g/dL (ref 3.8–4.9)
Alkaline Phosphatase: 100 IU/L (ref 48–121)
BUN/Creatinine Ratio: 16 (ref 9–20)
BUN: 14 mg/dL (ref 6–24)
Bilirubin Total: 0.4 mg/dL (ref 0.0–1.2)
CO2: 23 mmol/L (ref 20–29)
Calcium: 9.7 mg/dL (ref 8.7–10.2)
Chloride: 99 mmol/L (ref 96–106)
Creatinine, Ser: 0.88 mg/dL (ref 0.76–1.27)
GFR calc Af Amer: 114 mL/min/{1.73_m2} (ref >59)
GFR calc non Af Amer: 99 mL/min/{1.73_m2} (ref >59)
Globulin, Total: 2.6 g/dL (ref 1.5–4.5)
Glucose: 216 mg/dL — ABNORMAL HIGH (ref 65–99)
Potassium: 5 mmol/L (ref 3.5–5.2)
Sodium: 139 mmol/L (ref 134–144)
Total Protein: 7.5 g/dL (ref 6.0–8.5)

## 2020-02-29 ENCOUNTER — Other Ambulatory Visit: Payer: Self-pay | Admitting: Family

## 2020-02-29 MED ORDER — ATORVASTATIN CALCIUM 20 MG PO TABS
20.0000 mg | ORAL_TABLET | Freq: Every day | ORAL | 3 refills | Status: DC
Start: 2020-02-29 — End: 2021-05-09

## 2020-02-29 MED ORDER — METFORMIN HCL ER 750 MG PO TB24: 1500.0000 mg | ORAL_TABLET | Freq: Every day | ORAL | 2 refills | Status: DC

## 2020-02-29 MED ORDER — EMPAGLIFLOZIN 10 MG PO TABS
10.0000 mg | ORAL_TABLET | Freq: Every day | ORAL | 1 refills | Status: DC
Start: 2020-02-29 — End: 2020-08-23

## 2020-03-10 ENCOUNTER — Telehealth: Payer: Self-pay | Admitting: Family

## 2020-03-10 ENCOUNTER — Other Ambulatory Visit: Payer: Self-pay | Admitting: Family Medicine

## 2020-03-10 ENCOUNTER — Ambulatory Visit: Payer: Commercial Managed Care - PPO | Admitting: Family

## 2020-03-10 ENCOUNTER — Other Ambulatory Visit: Payer: Self-pay | Admitting: Family

## 2020-03-10 DIAGNOSIS — F339 Major depressive disorder, recurrent, unspecified: Secondary | ICD-10-CM

## 2020-03-10 MED ORDER — BUPROPION HCL ER (XL) 150 MG PO TB24: ORAL_TABLET | ORAL | 0 refills | Status: DC

## 2020-03-10 NOTE — Telephone Encounter (Signed)
  Prescription Request  03/10/2020  What is the name of the medication or equipment? buPROPion (WELLBUTRIN XL) 150 MG 24 hr tablet Pt is out of this medication   Have you contacted your pharmacy to request a refill? (if applicable) YES  Which pharmacy would you like this sent to? [facility]    Patient notified that their request is being sent to the clinical staff for review and that they should receive a response within 2 business days.

## 2020-03-10 NOTE — Telephone Encounter (Signed)
Sent!

## 2020-03-10 NOTE — Telephone Encounter (Signed)
Velna Hatchet called from [facility] regarding denial on pt's Bupropion Rx due to pt being discharged from practice for bad debt balance. Pt had his last visit on 02/10/20 and was discharged on 02/25/20. Says we sent in refills for his metformin and atorvastatin Rx on 02/29/20. Wants to know if we can approve to have his Bupropion refilled since we refilled his other Rx's.

## 2020-03-21 ENCOUNTER — Other Ambulatory Visit: Payer: Self-pay | Admitting: Family

## 2020-03-21 DIAGNOSIS — F339 Major depressive disorder, recurrent, unspecified: Secondary | ICD-10-CM

## 2020-03-21 DIAGNOSIS — M48062 Spinal stenosis, lumbar region with neurogenic claudication: Secondary | ICD-10-CM

## 2020-03-21 DIAGNOSIS — M48061 Spinal stenosis, lumbar region without neurogenic claudication: Secondary | ICD-10-CM

## 2020-03-21 DIAGNOSIS — M5137 Other intervertebral disc degeneration, lumbosacral region: Secondary | ICD-10-CM

## 2020-04-10 ENCOUNTER — Other Ambulatory Visit: Payer: Self-pay | Admitting: Family

## 2020-04-10 DIAGNOSIS — M5137 Other intervertebral disc degeneration, lumbosacral region: Secondary | ICD-10-CM

## 2020-04-10 DIAGNOSIS — M48062 Spinal stenosis, lumbar region with neurogenic claudication: Secondary | ICD-10-CM

## 2020-04-10 DIAGNOSIS — M48061 Spinal stenosis, lumbar region without neurogenic claudication: Secondary | ICD-10-CM

## 2020-04-10 DIAGNOSIS — F339 Major depressive disorder, recurrent, unspecified: Secondary | ICD-10-CM

## 2020-06-23 ENCOUNTER — Other Ambulatory Visit: Payer: Self-pay

## 2020-06-23 ENCOUNTER — Encounter: Payer: Self-pay | Admitting: Family

## 2020-06-23 ENCOUNTER — Ambulatory Visit (INDEPENDENT_AMBULATORY_CARE_PROVIDER_SITE_OTHER): Payer: Commercial Managed Care - PPO | Admitting: Family

## 2020-06-23 VITALS — BP 152/96 | HR 112 | Temp 97.0°F | Ht 70.0 in | Wt 219.6 lb

## 2020-06-23 DIAGNOSIS — M48061 Spinal stenosis, lumbar region without neurogenic claudication: Secondary | ICD-10-CM | POA: Diagnosis not present

## 2020-06-23 DIAGNOSIS — Z114 Encounter for screening for human immunodeficiency virus [HIV]: Secondary | ICD-10-CM

## 2020-06-23 DIAGNOSIS — F339 Major depressive disorder, recurrent, unspecified: Secondary | ICD-10-CM

## 2020-06-23 DIAGNOSIS — M48062 Spinal stenosis, lumbar region with neurogenic claudication: Secondary | ICD-10-CM | POA: Diagnosis not present

## 2020-06-23 DIAGNOSIS — I152 Hypertension secondary to endocrine disorders: Secondary | ICD-10-CM

## 2020-06-23 DIAGNOSIS — E1169 Type 2 diabetes mellitus with other specified complication: Secondary | ICD-10-CM | POA: Diagnosis not present

## 2020-06-23 DIAGNOSIS — M5137 Other intervertebral disc degeneration, lumbosacral region: Secondary | ICD-10-CM

## 2020-06-23 DIAGNOSIS — E1159 Type 2 diabetes mellitus with other circulatory complications: Secondary | ICD-10-CM

## 2020-06-23 DIAGNOSIS — Z1211 Encounter for screening for malignant neoplasm of colon: Secondary | ICD-10-CM

## 2020-06-23 DIAGNOSIS — E785 Hyperlipidemia, unspecified: Secondary | ICD-10-CM

## 2020-06-23 DIAGNOSIS — Z23 Encounter for immunization: Secondary | ICD-10-CM

## 2020-06-23 LAB — BAYER DCA HB A1C WAIVED: HB A1C (BAYER DCA - WAIVED): 9.6 % — ABNORMAL HIGH (ref <7.0)

## 2020-06-23 MED ORDER — SERTRALINE HCL 100 MG PO TABS: 100.0000 mg | ORAL_TABLET | Freq: Every day | ORAL | 5 refills | Status: DC

## 2020-06-23 MED ORDER — AMLODIPINE BESYLATE 5 MG PO TABS: 5.0000 mg | ORAL_TABLET | Freq: Every day | ORAL | 3 refills | Status: DC

## 2020-06-23 MED ORDER — BUPROPION HCL ER (XL) 150 MG PO TB24: ORAL_TABLET | ORAL | 0 refills | Status: DC

## 2020-06-23 MED ORDER — RAMIPRIL 10 MG PO CAPS: ORAL_CAPSULE | ORAL | 3 refills | Status: DC

## 2020-06-23 MED ORDER — BUSPIRONE HCL 5 MG PO TABS: 5.0000 mg | ORAL_TABLET | Freq: Three times a day (TID) | ORAL | 2 refills | Status: DC

## 2020-06-23 MED ORDER — GABAPENTIN 800 MG PO TABS: ORAL_TABLET | ORAL | 0 refills | Status: DC

## 2020-06-23 MED ORDER — METFORMIN HCL ER 750 MG PO TB24: 1500.0000 mg | ORAL_TABLET | Freq: Every day | ORAL | 2 refills | Status: DC

## 2020-06-23 NOTE — Patient Instructions (Addendum)
Living With Depression Everyone experiences occasional disappointment, sadness, and loss in their lives. When you are feeling down, blue, or sad for at least 2 weeks in a row, it may mean that you have depression. Depression can affect your thoughts and feelings, relationships, daily activities, and physical health. It is caused by changes in the way your brain functions. If you receive a diagnosis of depression, your health care provider will tell you which type of depression you have and what treatment options are available to you. If you are living with depression, there are ways to help you recover from it and also ways to prevent it from coming back. How to cope with lifestyle changes Coping with stress     Stress is your body's reaction to life changes and events, both good and bad. Stressful situations may include:  Getting married.  The death of a spouse.  Losing a job.  Retiring.  Having a baby. Stress can last just a few hours or it can be ongoing. Stress can play a major role in depression, so it is important to learn both how to cope with stress and how to think about it differently. Talk with your health care provider or a counselor if you would like to learn more about stress reduction. He or she may suggest some stress reduction techniques, such as:  Music therapy. This can include creating music or listening to music. Choose music that you enjoy and that inspires you.  Mindfulness-based meditation. This kind of meditation can be done while sitting or walking. It involves being aware of your normal breaths, rather than trying to control your breathing.  Centering prayer. This is a kind of meditation that involves focusing on a spiritual word or phrase. Choose a word, phrase, or sacred image that is meaningful to you and that brings you peace.  Deep breathing. To do this, expand your stomach and inhale slowly through your nose. Hold your breath for 3-5 seconds, then exhale  slowly, allowing your stomach muscles to relax.  Muscle relaxation. This involves intentionally tensing muscles then relaxing them. Choose a stress reduction technique that fits your lifestyle and personality. Stress reduction techniques take time and practice to develop. Set aside 5-15 minutes a day to do them. Therapists can offer training in these techniques. The training may be covered by some insurance plans. Other things you can do to manage stress include:  Keeping a stress diary. This can help you learn what triggers your stress and ways to control your response.  Understanding what your limits are and saying no to requests or events that lead to a schedule that is too full.  Thinking about how you respond to certain situations. You may not be able to control everything, but you can control how you react.  Adding humor to your life by watching funny films or TV shows.  Making time for activities that help you relax and not feeling guilty about spending your time this way.  Medicines Your health care provider may suggest certain medicines if he or she feels that they will help improve your condition. Avoid using alcohol and other substances that may prevent your medicines from working properly (may interact). It is also important to:  Talk with your pharmacist or health care provider about all the medicines that you take, their possible side effects, and what medicines are safe to take together.  Make it your goal to take part in all treatment decisions (shared decision-making). This includes giving input on   the side effects of medicines. It is best if shared decision-making with your health care provider is part of your total treatment plan. If your health care provider prescribes a medicine, you may not notice the full benefits of it for 4-8 weeks. Most people who are treated for depression need to be on medicine for at least 6-12 months after they feel better. If you are taking  medicines as part of your treatment, do not stop taking medicines without first talking to your health care provider. You may need to have the medicine slowly decreased (tapered) over time to decrease the risk of harmful side effects. Relationships Your health care provider may suggest family therapy along with individual therapy and drug therapy. While there may not be family problems that are causing you to feel depressed, it is still important to make sure your family learns as much as they can about your mental health. Having your family's support can help make your treatment successful. How to recognize changes in your condition Everyone has a different response to treatment for depression. Recovery from major depression happens when you have not had signs of major depression for two months. This may mean that you will start to:  Have more interest in doing activities.  Feel less hopeless than you did 2 months ago.  Have more energy.  Overeat less often, or have better or improving appetite.  Have better concentration. Your health care provider will work with you to decide the next steps in your recovery. It is also important to recognize when your condition is getting worse. Watch for these signs:  Having fatigue or low energy.  Eating too much or too little.  Sleeping too much or too little.  Feeling restless, agitated, or hopeless.  Having trouble concentrating or making decisions.  Having unexplained physical complaints.  Feeling irritable, angry, or aggressive. Get help as soon as you or your family members notice these symptoms coming back. How to get support and help from others How to talk with friends and family members about your condition  Talking to friends and family members about your condition can provide you with one way to get support and guidance. Reach out to trusted friends or family members, explain your symptoms to them, and let them know that you are  working with a health care provider to treat your depression. Financial resources Not all insurance plans cover mental health care, so it is important to check with your insurance carrier. If paying for co-pays or counseling services is a problem, search for a local or county mental health care center. They may be able to offer public mental health care services at low or no cost when you are not able to see a private health care provider. If you are taking medicine for depression, you may be able to get the generic form, which may be less expensive. Some makers of prescription medicines also offer help to patients who cannot afford the medicines they need. Follow these instructions at home:   Get the right amount and quality of sleep.  Cut down on using caffeine, tobacco, alcohol, and other potentially harmful substances.  Try to exercise, such as walking or lifting small weights.  Take over-the-counter and prescription medicines only as told by your health care provider.  Eat a healthy diet that includes plenty of vegetables, fruits, whole grains, low-fat dairy products, and lean protein. Do not eat a lot of foods that are high in solid fats, added sugars, or salt.    Keep all follow-up visits as told by your health care provider. This is important. Contact a health care provider if:  You stop taking your antidepressant medicines, and you have any of these symptoms: ? Nausea. ? Headache. ? Feeling lightheaded. ? Chills and body aches. ? Not being able to sleep (insomnia).  You or your friends and family think your depression is getting worse. Get help right away if:  You have thoughts of hurting yourself or others. If you ever feel like you may hurt yourself or others, or have thoughts about taking your own life, get help right away. You can go to your nearest emergency department or call:  Your local emergency services (911 in the [place]  A suicide crisis helpline, such as the  National Suicide Prevention Lifeline at 830-406-0399. This is open 24-hours a day. Summary  If you are living with depression, there are ways to help you recover from it and also ways to prevent it from coming back.  Work with your health care team to create a management plan that includes counseling, stress management techniques, and healthy lifestyle habits. This information is not intended to replace advice given to you by your health care provider. Make sure you discuss any questions you have with your health care provider. Document Revised: 12/04/2018 Document Reviewed: 07/15/2016 Elsevier Patient Education  2020 [employer].   Diabetes Mellitus and Nutrition, Adult When you have diabetes (diabetes mellitus), it is very important to have healthy eating habits because your blood sugar (glucose) levels are greatly affected by what you eat and drink. Eating healthy foods in the appropriate amounts, at about the same times every day, can help you:  Control your blood glucose.  Lower your risk of heart disease.  Improve your blood pressure.  Reach or maintain a healthy weight. Every person with diabetes is different, and each person has different needs for a meal plan. Your health care provider may recommend that you work with a diet and nutrition specialist (dietitian) to make a meal plan that is best for you. Your meal plan may vary depending on factors such as:  The calories you need.  The medicines you take.  Your weight.  Your blood glucose, blood pressure, and cholesterol levels.  Your activity level.  Other health conditions you have, such as heart or kidney disease. How do carbohydrates affect me? Carbohydrates, also called carbs, affect your blood glucose level more than any other type of food. Eating carbs naturally raises the amount of glucose in your blood. Carb counting is a method for keeping track of how many carbs you eat. Counting carbs is important to keep  your blood glucose at a healthy level, especially if you use insulin or take certain oral diabetes medicines. It is important to know how many carbs you can safely have in each meal. This is different for every person. Your dietitian can help you calculate how many carbs you should have at each meal and for each snack. Foods that contain carbs include:  Bread, cereal, rice, pasta, and crackers.  Potatoes and corn.  Peas, beans, and lentils.  Milk and yogurt.  Fruit and juice.  Desserts, such as cakes, cookies, ice cream, and candy. How does alcohol affect me? Alcohol can cause a sudden decrease in blood glucose (hypoglycemia), especially if you use insulin or take certain oral diabetes medicines. Hypoglycemia can be a life-threatening condition. Symptoms of hypoglycemia (sleepiness, dizziness, and confusion) are similar to symptoms of having too much alcohol. If  your health care provider says that alcohol is safe for you, follow these guidelines:  Limit alcohol intake to no more than 1 drink per day for nonpregnant women and 2 drinks per day for men. One drink equals 12 oz of beer, 5 oz of wine, or 1 oz of hard liquor.  Do not drink on an empty stomach.  Keep yourself hydrated with water, diet soda, or unsweetened iced tea.  Keep in mind that regular soda, juice, and other mixers may contain a lot of sugar and must be counted as carbs. What are tips for following this plan?  Reading food labels  Start by checking the serving size on the "Nutrition Facts" label of packaged foods and drinks. The amount of calories, carbs, fats, and other nutrients listed on the label is based on one serving of the item. Many items contain more than one serving per package.  Check the total grams (g) of carbs in one serving. You can calculate the number of servings of carbs in one serving by dividing the total carbs by 15. For example, if a food has 30 g of total carbs, it would be equal to 2 servings  of carbs.  Check the number of grams (g) of saturated and trans fats in one serving. Choose foods that have low or no amount of these fats.  Check the number of milligrams (mg) of salt (sodium) in one serving. Most people should limit total sodium intake to less than 2,300 mg per day.  Always check the nutrition information of foods labeled as "low-fat" or "nonfat". These foods may be higher in added sugar or refined carbs and should be avoided.  Talk to your dietitian to identify your daily goals for nutrients listed on the label. Shopping  Avoid buying canned, premade, or processed foods. These foods tend to be high in fat, sodium, and added sugar.  Shop around the outside edge of the grocery store. This includes fresh fruits and vegetables, bulk grains, fresh meats, and fresh dairy. Cooking  Use low-heat cooking methods, such as baking, instead of high-heat cooking methods like deep frying.  Cook using healthy oils, such as olive, canola, or sunflower oil.  Avoid cooking with butter, cream, or high-fat meats. Meal planning  Eat meals and snacks regularly, preferably at the same times every day. Avoid going long periods of time without eating.  Eat foods high in fiber, such as fresh fruits, vegetables, beans, and whole grains. Talk to your dietitian about how many servings of carbs you can eat at each meal.  Eat 4-6 ounces (oz) of lean protein each day, such as lean meat, chicken, fish, eggs, or tofu. One oz of lean protein is equal to: ? 1 oz of meat, chicken, or fish. ? 1 egg. ?  cup of tofu.  Eat some foods each day that contain healthy fats, such as avocado, nuts, seeds, and fish. Lifestyle  Check your blood glucose regularly.  Exercise regularly as told by your health care provider. This may include: ? 150 minutes of moderate-intensity or vigorous-intensity exercise each week. This could be brisk walking, biking, or water aerobics. ? Stretching and doing strength  exercises, such as yoga or weightlifting, at least 2 times a week.  Take medicines as told by your health care provider.  Do not use any products that contain nicotine or tobacco, such as cigarettes and e-cigarettes. If you need help quitting, ask your health care provider.  Work with a Veterinary surgeon or diabetes educator to  identify strategies to manage stress and any emotional and social challenges. Questions to ask a health care provider  Do I need to meet with a diabetes educator?  Do I need to meet with a dietitian?  What number can I call if I have questions?  When are the best times to check my blood glucose? Where to find more information:  American Diabetes Association: diabetes.org  Academy of Nutrition and Dietetics: www.eatright.[employer] of Diabetes and Digestive and Kidney Diseases (NIH): CarFli[employer].tn Summary  A healthy meal plan will help you control your blood glucose and maintain a healthy lifestyle.  Working with a diet and nutrition specialist (dietitian) can help you make a meal plan that is best for you.  Keep in mind that carbohydrates (carbs) and alcohol have immediate effects on your blood glucose levels. It is important to count carbs and to use alcohol carefully. This information is not intended to replace advice given to you by your health care provider. Make sure you discuss any questions you have with your health care provider. Document Revised: 07/25/2017 Document Reviewed: 09/16/2016 Elsevier Patient Education  2020 [employer].

## 2020-06-23 NOTE — Progress Notes (Signed)
Subjective:    Patient ID: John Ware, male    DOB: 04-04-1966, 54 y.o.   MRN: [id number]  Chief Complaint  Patient presents with  . Coronary Artery Disease   PT calls the office today for chronic follow up. He is followed by [facility] every month for chronic back pain. Has hx of lumbar stenosis.  Coronary Artery Disease Presents for follow-up visit. Pertinent negatives include no chest pressure or chest tightness. Risk factors include hyperlipidemia and obesity.  Hyperlipidemia This is a chronic problem. The current episode started more than 1 year ago. The problem is uncontrolled. Recent lipid tests were reviewed and are high. Exacerbating diseases include obesity. Associated symptoms include myalgias. Current antihyperlipidemic treatment includes statins. The current treatment provides moderate improvement of lipids. Risk factors for coronary artery disease include diabetes mellitus, dyslipidemia, male sex, hypertension, a sedentary lifestyle and obesity.  Diabetes He presents for his follow-up diabetic visit. He has type 2 diabetes mellitus. His disease course has been stable. There are no hypoglycemic associated symptoms. Associated symptoms include foot paresthesias. Pertinent negatives for diabetes include no blurred vision. Symptoms are stable. Diabetic complications include peripheral neuropathy. Pertinent negatives for diabetic complications include no nephropathy. Risk factors for coronary artery disease include dyslipidemia, male sex, hypertension, diabetes mellitus and sedentary lifestyle. He is following a generally unhealthy diet. His overall blood glucose range is 130-140 mg/dl. An ACE inhibitor/angiotensin II receptor blocker is being taken. Eye exam is not current.  Back Pain This is a chronic problem. The current episode started more than 1 year ago. The problem occurs intermittently. The problem has been waxing and waning since onset. The pain is present in the lumbar  spine. The quality of the pain is described as aching. The pain is at a severity of 6/10. The pain is moderate. He has tried analgesics for the symptoms. The treatment provided moderate relief.  Depression        This is a chronic problem.  The current episode started more than 1 year ago.   The onset quality is gradual.   The problem occurs intermittently.  The problem has been waxing and waning since onset.  Associated symptoms include helplessness, hopelessness, irritable, myalgias and sad.     Review of Systems  Eyes: Negative for blurred vision.  Respiratory: Negative for chest tightness.   Musculoskeletal: Positive for back pain and myalgias.  Psychiatric/Behavioral: Positive for depression.  All other systems reviewed and are negative.      Objective:   Physical Exam Vitals reviewed.  Constitutional:      General: He is irritable. He is not in acute distress.    Appearance: He is well-developed. He is obese.  HENT:     Head: Normocephalic.     Right Ear: Tympanic membrane normal.     Left Ear: Tympanic membrane normal.  Eyes:     General:        Right eye: No discharge.        Left eye: No discharge.     Pupils: Pupils are equal, round, and reactive to light.  Neck:     Thyroid: No thyromegaly.  Cardiovascular:     Rate and Rhythm: Regular rhythm. Tachycardia present.     Heart sounds: Normal heart sounds. No murmur heard.   Pulmonary:     Effort: Pulmonary effort is normal. No respiratory distress.     Breath sounds: Normal breath sounds. No wheezing.  Abdominal:     General: Bowel sounds are  normal. There is no distension.     Palpations: Abdomen is soft.     Tenderness: There is no abdominal tenderness.  Musculoskeletal:        General: No tenderness. Normal range of motion.     Cervical back: Normal range of motion and neck supple.  Skin:    General: Skin is warm and dry.     Findings: No erythema or rash.  Neurological:     Mental Status: He is alert and  oriented to person, place, and time.     Cranial Nerves: No cranial nerve deficit.     Deep Tendon Reflexes: Reflexes are normal and symmetric.  Psychiatric:        Behavior: Behavior normal.        Thought Content: Thought content normal.        Judgment: Judgment normal.       BP (!) 152/96   Pulse (!) 112   Temp (!) 97 F (36.1 C) (Temporal)   Ht '5\' 10"'  (1.778 m)   Wt 219 lb 9.6 oz (99.6 kg)   SpO2 91%   BMI 31.51 kg/m      Assessment & Plan:  Callahan L Roussell comes in today with chief complaint of Coronary Artery Disease   Diagnosis and orders addressed:  1. Spinal stenosis of lumbar region, unspecified whether neurogenic claudication present - gabapentin (NEURONTIN) 800 MG tablet; TAKE  (1)  TABLET  THREE TIMES DAILY.  Dispense: 90 tablet; Refill: 0 - CMP14+EGFR  2. DDD (degenerative disc disease), lumbosacral - gabapentin (NEURONTIN) 800 MG tablet; TAKE  (1)  TABLET  THREE TIMES DAILY.  Dispense: 90 tablet; Refill: 0 - CMP14+EGFR  3. Spinal stenosis of lumbar region with neurogenic claudication - gabapentin (NEURONTIN) 800 MG tablet; TAKE  (1)  TABLET  THREE TIMES DAILY.  Dispense: 90 tablet; Refill: 0 - CMP14+EGFR  4. Depression, recurrent ([place]) Will add Buspar 5 mg TID prn - sertraline (ZOLOFT) 100 MG tablet; Take 1 tablet (100 mg total) by mouth daily.  Dispense: 30 tablet; Refill: 5 - buPROPion (WELLBUTRIN XL) 150 MG 24 hr tablet; TAKE (1) TABLET TWICE A DAY.  Dispense: 30 tablet; Refill: 0 - CMP14+EGFR - busPIRone (BUSPAR) 5 MG tablet; Take 1 tablet (5 mg total) by mouth 3 (three) times daily.  Dispense: 90 tablet; Refill: 2  5. Hypertension associated with diabetes ([place]) - amLODipine (NORVASC) 5 MG tablet; Take 1 tablet (5 mg total) by mouth daily.  Dispense: 90 tablet; Refill: 3 - ramipril (ALTACE) 10 MG capsule; TAKE (1) CAPSULE DAILY  Dispense: 90 capsule; Refill: 3 - CMP14+EGFR  6. Type 2 diabetes mellitus with other specified complication, without  long-term current use of insulin (HCC) - ramipril (ALTACE) 10 MG capsule; TAKE (1) CAPSULE DAILY  Dispense: 90 capsule; Refill: 3 - Bayer DCA Hb A1c Waived - CMP14+EGFR  7. Hyperlipidemia associated with type 2 diabetes mellitus (HCC) - CMP14+EGFR  8. Colon cancer screening - Cologuard - CMP14+EGFR  9. Encounter for screening for HIV - CMP14+EGFR - HIV Antibody (routine testing w rflx)    Labs pending Health Maintenance reviewed Diet and exercise encouraged  Follow up plan: 3 months    Evelina Dun, FNP

## 2020-06-24 LAB — CMP14+EGFR
ALT: 36 IU/L (ref 0–44)
AST: 30 IU/L (ref 0–40)
Albumin/Globulin Ratio: 2 (ref 1.2–2.2)
Albumin: 4.8 g/dL (ref 3.8–4.9)
Alkaline Phosphatase: 123 IU/L — ABNORMAL HIGH (ref 44–121)
BUN/Creatinine Ratio: 11 (ref 9–20)
BUN: 12 mg/dL (ref 6–24)
Bilirubin Total: 0.3 mg/dL (ref 0.0–1.2)
CO2: 20 mmol/L (ref 20–29)
Calcium: 9.6 mg/dL (ref 8.7–10.2)
Chloride: 97 mmol/L (ref 96–106)
Creatinine, Ser: 1.08 mg/dL (ref 0.76–1.27)
GFR calc Af Amer: 91 mL/min/{1.73_m2} (ref >59)
GFR calc non Af Amer: 78 mL/min/{1.73_m2} (ref >59)
Globulin, Total: 2.4 g/dL (ref 1.5–4.5)
Glucose: 413 mg/dL (ref 65–99)
Potassium: 4.6 mmol/L (ref 3.5–5.2)
Sodium: 138 mmol/L (ref 134–144)
Total Protein: 7.2 g/dL (ref 6.0–8.5)

## 2020-06-24 LAB — HIV ANTIBODY (ROUTINE TESTING W REFLEX): HIV Screen 4th Generation wRfx: NONREACTIVE

## 2020-06-26 ENCOUNTER — Telehealth: Payer: Self-pay | Admitting: *Deleted

## 2020-06-26 ENCOUNTER — Other Ambulatory Visit: Payer: Self-pay | Admitting: Family

## 2020-06-26 MED ORDER — INSULIN DEGLUDEC 100 UNIT/ML ~~LOC~~ SOPN: 8.0000 [IU] | PEN_INJECTOR | Freq: Every day | SUBCUTANEOUS | 11 refills | Status: DC

## 2020-06-26 NOTE — Telephone Encounter (Signed)
PA in process for John Ware  Key: [id number]  Your information has been sent to [facility].

## 2020-06-27 ENCOUNTER — Telehealth: Payer: Self-pay | Admitting: Family Medicine

## 2020-06-27 NOTE — Telephone Encounter (Signed)
Tresiba denied

## 2020-06-27 NOTE — Telephone Encounter (Signed)
Pt needs to make appt with Micah Noel

## 2020-06-27 NOTE — Telephone Encounter (Signed)
Please make an appt with John Ware asap. Thanks

## 2020-07-07 NOTE — Telephone Encounter (Signed)
Line busy

## 2020-07-11 ENCOUNTER — Other Ambulatory Visit: Payer: Self-pay | Admitting: Family

## 2020-07-11 DIAGNOSIS — F339 Major depressive disorder, recurrent, unspecified: Secondary | ICD-10-CM

## 2020-07-28 ENCOUNTER — Other Ambulatory Visit: Payer: Self-pay | Admitting: Family

## 2020-07-28 DIAGNOSIS — M48061 Spinal stenosis, lumbar region without neurogenic claudication: Secondary | ICD-10-CM

## 2020-07-28 DIAGNOSIS — M48062 Spinal stenosis, lumbar region with neurogenic claudication: Secondary | ICD-10-CM

## 2020-07-28 DIAGNOSIS — M5137 Other intervertebral disc degeneration, lumbosacral region: Secondary | ICD-10-CM

## 2020-08-23 ENCOUNTER — Other Ambulatory Visit: Payer: Self-pay

## 2020-08-23 ENCOUNTER — Encounter (HOSPITAL_COMMUNITY): Payer: Self-pay

## 2020-08-23 ENCOUNTER — Emergency Department (HOSPITAL_COMMUNITY)
Admission: EM | Admit: 2020-08-23 | Discharge: 2020-08-23 | Disposition: A | Discharge status: Home / Self Care | Payer: Commercial Managed Care - PPO | Attending: Emergency Medicine | Admitting: Emergency Medicine

## 2020-08-23 ENCOUNTER — Emergency Department (HOSPITAL_COMMUNITY): Payer: Commercial Managed Care - PPO

## 2020-08-23 DIAGNOSIS — J189 Pneumonia, unspecified organism: Secondary | ICD-10-CM

## 2020-08-23 DIAGNOSIS — R001 Bradycardia, unspecified: Secondary | ICD-10-CM | POA: Insufficient documentation

## 2020-08-23 DIAGNOSIS — I10 Essential (primary) hypertension: Secondary | ICD-10-CM | POA: Diagnosis not present

## 2020-08-23 DIAGNOSIS — Z7984 Long term (current) use of oral hypoglycemic drugs: Secondary | ICD-10-CM | POA: Insufficient documentation

## 2020-08-23 DIAGNOSIS — E119 Type 2 diabetes mellitus without complications: Secondary | ICD-10-CM | POA: Insufficient documentation

## 2020-08-23 DIAGNOSIS — Z792 Long term (current) use of antibiotics: Secondary | ICD-10-CM | POA: Diagnosis not present

## 2020-08-23 DIAGNOSIS — Z79899 Other long term (current) drug therapy: Secondary | ICD-10-CM | POA: Diagnosis not present

## 2020-08-23 DIAGNOSIS — E1169 Type 2 diabetes mellitus with other specified complication: Secondary | ICD-10-CM | POA: Insufficient documentation

## 2020-08-23 DIAGNOSIS — Z20822 Contact with and (suspected) exposure to covid-19: Secondary | ICD-10-CM | POA: Insufficient documentation

## 2020-08-23 DIAGNOSIS — Z87891 Personal history of nicotine dependence: Secondary | ICD-10-CM | POA: Diagnosis not present

## 2020-08-23 DIAGNOSIS — R059 Cough, unspecified: Secondary | ICD-10-CM | POA: Diagnosis present

## 2020-08-23 LAB — RESP PANEL BY RT-PCR (FLU A&B, COVID) ARPGX2
Influenza A by PCR: NEGATIVE
Influenza B by PCR: NEGATIVE
SARS Coronavirus 2 by RT PCR: NEGATIVE

## 2020-08-23 MED ORDER — AMOXICILLIN-POT CLAVULANATE 875-125 MG PO TABS: 1.0000 | ORAL_TABLET | Freq: Two times a day (BID) | ORAL | 0 refills | Status: DC

## 2020-08-23 MED ORDER — DOXYCYCLINE HYCLATE 100 MG PO CAPS: 100.0000 mg | ORAL_CAPSULE | Freq: Two times a day (BID) | ORAL | 0 refills | Status: DC

## 2020-08-23 MED ORDER — ACETAMINOPHEN 325 MG PO TABS: 650.0000 mg | ORAL_TABLET | Freq: Once | ORAL | Status: DC

## 2020-08-23 NOTE — Discharge Instructions (Addendum)
You were seen in the emergency department today for a cough.  Your Covid and flu test were negative.  Your chest x-ray showed findings of pneumonia.  We are starting you on doxycycline and Augmentin to treat this infection.  Please take all of your antibiotics until finished. You may develop abdominal discomfort or diarrhea from the antibiotic.  You may help offset this with probiotics which you can buy at the store (ask your pharmacist if unable to find) or get probiotics in the form of eating yogurt. Do not eat or take the probiotics until 2 hours after your antibiotic. If you are unable to tolerate these side effects follow-up with your primary care provider or return to the emergency department.   If you begin to experience any blistering, rashes, swelling, or difficulty breathing seek medical care for evaluation of potentially more serious side effects.   Please be aware that this medication may interact with other medications you are taking, please be sure to discuss your medication list with your pharmacist.    Please follow-up with your primary care provider within 3 days.  Return to the ER for new or worsening symptoms including but not limited to increased trouble breathing, chest pain, passing out, coughing up blood, fever after 48 hours of antibiotics, or any other concerns.

## 2020-08-23 NOTE — ED Notes (Signed)
Patient left before Tylenol could be given. Patient advised to wait.

## 2020-08-23 NOTE — ED Provider Notes (Signed)
[facility]-EMERGENCY DEPT Provider Note   CSN: [id number] Arrival date & time: 08/23/20  1041     History Chief Complaint  Patient presents with  . Cough  . Fever    John Ware is a 54 y.o. male with history of diabetes mellitus, hypertension, and depression who presents to the emergency department with complaints of cough for the past 2 days.  Patient states that he has had some nasal congestion, cough productive of phlegm sputum, sore throat, subjective fever, and chills for the past 2 days.  He notes that he also feels short of breath at times.  He also notes some pain in his upper back when he coughs.  He was concerned he might have COVID-19 therefore he came to the emergency department to get checked out.  He states he has had his flu vaccine.  Denies chest pain, nausea, vomiting, diaphoresis, syncope, leg pain/swelling, hemoptysis, recent surgery/trauma, recent long travel, hormone use, personal hx of cancer, or hx of DVT/PE.   HPI     Past Medical History:  Diagnosis Date  . Depression   . Diabetes mellitus without complication (HCC)   . Hypertension   . Spinal stenosis    With Neurogenic Claudication    Patient Active Problem List   Diagnosis Date Noted  . Diabetes mellitus (HCC) 10/30/2018  . Lumbar stenosis with neurogenic claudication 10/14/2017  . DDD (degenerative disc disease), lumbar 02/17/2017  . Left leg weakness 02/05/2017  . Hyperlipidemia associated with type 2 diabetes mellitus (HCC) 10/24/2016  . Depression, recurrent (HCC) 10/24/2016  . Fall 05/21/2016  . Right knee pain 05/21/2016  . DDD (degenerative disc disease), lumbosacral 05/21/2016  . Body mass index 32.0-32.9, adult 05/21/2016  . Lumbar stenosis 12/13/2014    Past Surgical History:  Procedure Laterality Date  . CARPAL TUNNEL RELEASE Bilateral   . LUMBAR LAMINECTOMY/DECOMPRESSION MICRODISCECTOMY N/A 12/13/2014   Procedure: LUMBAR THREE-FOUR, LUMBAR FOUR-FIVE,  LUMBAR FIVE-SACRAL ONE LAMINECTOMY;  Surgeon: Barnett AbuHenry Elsner, MD;  Location: [facility];  Service: Neurosurgery;  Laterality: N/A;  L3-4 L4-5 L5-S1 Laminectomy  . MENISCUS REPAIR     Right knee  . 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 VASECTOMY         History reviewed. No pertinent family history.  Social History   Tobacco Use  . Smoking status: Former Games developermoker  . Smokeless tobacco: Never Used  Vaping Use  . Vaping Use: Never used  Substance Use Topics  . Alcohol use: Yes    Comment: occasional  . Drug use: No    Home Medications Prior to Admission medications   Medication Sig Start Date End Date Taking? Authorizing Provider  amLODipine (NORVASC) 5 MG tablet Take 1 tablet (5 mg total) by mouth daily. 06/23/20   Junie SpencerHawks, Christy A, FNP  atorvastatin (LIPITOR) 20 MG tablet Take 1 tablet (20 mg total) by mouth daily. Patient not taking: Reported on 06/23/2020 02/29/20   Jannifer RodneyHawks, Christy A, FNP  buPROPion (WELLBUTRIN XL) 150 MG 24 hr tablet TAKE (1) TABLET TWICE A DAY. 07/12/20   Junie SpencerHawks, Christy A, FNP  busPIRone (BUSPAR) 5 MG tablet Take 1 tablet (5 mg total) by mouth 3 (three) times daily. 06/23/20   Junie SpencerHawks, Christy A, FNP  empagliflozin (JARDIANCE) 10 MG TABS tablet Take 1 tablet (10 mg total) by mouth daily before breakfast. Patient not taking: Reported on 06/23/2020 02/29/20   Junie SpencerHawks, Christy A, FNP  gabapentin (NEURONTIN) 800 MG tablet TAKE (  1) TABLET THREE TIMES DAILY. 07/28/20   Jannifer Rodney A, FNP  insulin degludec (TRESIBA) 100 UNIT/ML FlexTouch Pen Inject 8 Units into the skin daily. 06/26/20   Junie Spencer, FNP  metFORMIN (GLUCOPHAGE XR) 750 MG 24 hr tablet Take 2 tablets (1,500 mg total) by mouth daily with breakfast. 06/23/20 12/20/20  Junie Spencer, FNP  oxyCODONE ER (XTAMPZA ER) 18 MG C12A  11/26/19   [provider]  oxyCODONE-acetaminophen (PERCOCET) 7.5-325 MG tablet Take 1 tablet by mouth 3 (three) times daily as needed. 12/25/19   [provider]  ramipril (ALTACE) 10 MG capsule TAKE (1) CAPSULE DAILY 06/23/20   Jannifer Rodney A, FNP  sertraline (ZOLOFT) 100 MG tablet Take 1 tablet (100 mg total) by mouth daily. 06/23/20   Junie Spencer, FNP    Allergies    Tizanidine  Review of Systems   Review of Systems  Constitutional: Positive for chills and fever.  HENT: Positive for congestion and sore throat.   Eyes: Negative for visual disturbance.  Respiratory: Positive for cough and shortness of breath.   Cardiovascular: Negative for chest pain and leg swelling.  Gastrointestinal: Negative for abdominal pain, diarrhea, nausea and vomiting.  All other systems reviewed and are negative.   Physical Exam Updated Vital Signs BP (!) 96/58 (BP Location: Left Arm)   Pulse (!) 48   Temp 98.8 F (37.1 C) (Oral)   Resp 18   SpO2 93%   Physical Exam Vitals and nursing note reviewed.  Constitutional:      General: He is not in acute distress.    Appearance: He is well-developed. He is not toxic-appearing.  HENT:     Head: Normocephalic and atraumatic.     Right Ear: Ear canal normal. Tympanic membrane is not perforated, erythematous, retracted or bulging.     Left Ear: Ear canal normal. Tympanic membrane is not perforated, erythematous, retracted or bulging.     Ears:     Comments: No mastoid erythema/swellng/tenderness.     Nose: Congestion present.     Right Sinus: No maxillary sinus tenderness or frontal sinus tenderness.     Left Sinus: No maxillary sinus tenderness or frontal sinus tenderness.     Mouth/Throat:     Pharynx: Oropharynx is clear. Uvula midline. No oropharyngeal exudate or posterior oropharyngeal erythema.     Comments: Posterior oropharynx is symmetric appearing. Patient tolerating own secretions without difficulty. No trismus. No drooling. No hot potato voice. No swelling beneath the tongue, submandibular compartment is soft.  Eyes:     General:        Right eye: No discharge.        Left  eye: No discharge.     Conjunctiva/sclera: Conjunctivae normal.  Cardiovascular:     Rate and Rhythm: Regular rhythm. Bradycardia present.  Pulmonary:     Effort: Pulmonary effort is normal. No respiratory distress.     Breath sounds: Normal breath sounds. No wheezing, rhonchi or rales.  Chest:     Chest wall: Tenderness (Posterior chest wall in the mid back region bilaterally.) present.  Abdominal:     General: There is no distension.     Palpations: Abdomen is soft.     Tenderness: There is no abdominal tenderness.  Musculoskeletal:     Cervical back: Neck supple. No rigidity.  Lymphadenopathy:     Cervical: No cervical adenopathy.  Skin:    General: Skin is warm and dry.     Findings: No rash.  Neurological:     Mental Status: He is alert.  Psychiatric:        Behavior: Behavior normal.     ED Results / Procedures / Treatments   Labs (all labs ordered are listed, but only abnormal results are displayed) Labs Reviewed  RESP PANEL BY RT-PCR (FLU A&B, COVID) ARPGX2    EKG None  Radiology DG Chest 2 View  Result Date: 08/23/2020 CLINICAL DATA:  Headache, cough, fatigue, and fever for 2 days. EXAM: CHEST - 2 VIEW COMPARISON:  12/24/2004 FINDINGS: Normal heart size and pulmonary vascularity. Consolidation or atelectasis in the left lung base, likely focal pneumonia. Right lung is clear. No pleural effusions. No pneumothorax. Mediastinal contours appear intact. Degenerative changes in the spine. IMPRESSION: Consolidation or atelectasis in the left lung base, likely focal pneumonia. Electronically Signed   By: Burman Nieves M.D.   On: 08/23/2020 18:09    Procedures Procedures (including critical care time)  Medications Ordered in ED Medications - No data to display  ED Course  I have reviewed the triage vital signs and the nursing notes.  Pertinent labs & imaging results that were available during my care of the patient were reviewed by me and considered in my  medical decision making (see chart for details).    John Ware was evaluated in Emergency Department on 08/23/2020 for the symptoms described in the history of present illness. He/she was evaluated in the context of the global COVID-19 pandemic, which necessitated consideration that the patient might be at risk for infection with the SARS-CoV-2 virus that causes COVID-19. Institutional protocols and algorithms that pertain to the evaluation of patients at risk for COVID-19 are in a state of rapid change based on information released by regulatory bodies including the CDC and federal and state organizations. These policies and algorithms were followed during the patient's care in the ED.  MDM Rules/Calculators/A&P                         Patient presents to the ED with complaints of cough for the past few days.  Patient is nontoxic, resting comfortably, his initial blood pressure was soft however repeat blood pressures have been somewhat elevated, doubt hypertensive emergency.    Additional history obtained:  Additional history obtained from chart review & nursing note review.   Lab Tests:  I Ordered, reviewed, and interpreted labs, which included:  COVID/influenza: Negative  Imaging Studies ordered:  I ordered imaging studies which included CXR, I independently visualized and interpreted imaging which showed Consolidation or atelectasis in the left lung base, likely focal pneumonia.  ED Course:  Patient with findings of pneumonia at the left lung base on chest x-ray, likely the cause of his symptoms, he is Covid and flu negative. Ambulatory SPO2 93 to 96% on room air without evidence of respiratory distress.  Nursing staff has documented respiratory rate greater than 20, however on my multiple reassessments the patient is respiratory rate is 16-20.  He feels ready to go home.  Will start on antibiotics with PCP follow-up and strict return precautions. I discussed results, treatment plan,  need for follow-up, and return precautions with the patient. Provided opportunity for questions, patient confirmed understanding and is in agreement with plan.   Portions of this note were generated with Scientist, clinical (histocompatibility and immunogenetics). Dictation errors may occur despite best attempts at proofreading.  Final Clinical Impression(s) / ED Diagnoses Final diagnoses:  Community acquired pneumonia of left lower lobe of  lung    Rx / DC Orders ED Discharge Orders         Ordered    doxycycline (VIBRAMYCIN) 100 MG capsule  2 times daily        08/23/20 1908    amoxicillin-clavulanate (AUGMENTIN) 875-125 MG tablet  Every 12 hours        08/23/20 1908           Desmond Lope 08/23/20 1910    Milagros Loll, MD 08/24/20 [phone]

## 2020-08-23 NOTE — ED Triage Notes (Signed)
Pt reports headache, cough, fatigue, and fever x2 days. Pt reports being vaccinated for COVID.

## 2020-08-30 ENCOUNTER — Telehealth: Payer: Self-pay

## 2020-08-30 NOTE — Telephone Encounter (Signed)
Patient states that he feels somewhat better but has never had pneumonia like this. SOB at times when he gets up and moves around. Per hospital Covid negative. Televisit scheduled with PCP for tomorrow

## 2020-08-31 ENCOUNTER — Ambulatory Visit (HOSPITAL_COMMUNITY)
Admission: RE | Admit: 2020-08-31 | Discharge: 2020-08-31 | Disposition: A | Discharge status: Home / Self Care | Payer: Commercial Managed Care - PPO | Source: Ambulatory Visit | Attending: Family | Admitting: Family

## 2020-08-31 ENCOUNTER — Ambulatory Visit (INDEPENDENT_AMBULATORY_CARE_PROVIDER_SITE_OTHER): Payer: Commercial Managed Care - PPO | Admitting: Family

## 2020-08-31 ENCOUNTER — Encounter: Payer: Self-pay | Admitting: Family

## 2020-08-31 ENCOUNTER — Other Ambulatory Visit: Payer: Self-pay

## 2020-08-31 DIAGNOSIS — J189 Pneumonia, unspecified organism: Secondary | ICD-10-CM

## 2020-08-31 DIAGNOSIS — Z09 Encounter for follow-up examination after completed treatment for conditions other than malignant neoplasm: Secondary | ICD-10-CM | POA: Insufficient documentation

## 2020-08-31 DIAGNOSIS — B370 Candidal stomatitis: Secondary | ICD-10-CM | POA: Diagnosis not present

## 2020-08-31 MED ORDER — NYSTATIN 100000 UNIT/ML MT SUSP
5.0000 mL | Freq: Four times a day (QID) | OROMUCOSAL | 0 refills | Status: DC
Start: 2020-08-31 — End: 2020-10-20

## 2020-08-31 NOTE — Progress Notes (Signed)
Virtual Visit via telephone Note Due to COVID-19 pandemic this visit was conducted virtually. This visit type was conducted due to national recommendations for restrictions regarding the COVID-19 Pandemic (e.g. social distancing, sheltering in place) in an effort to limit this patient's exposure and mitigate transmission in our community. All issues noted in this document were discussed and addressed.  A physical exam was not performed with this format.  I connected with John Ware on 08/31/20 at 3:40 pm by telephone and verified that I am speaking with the correct person using two identifiers. John Ware is currently located at car and no one is currently with him  during visit. The provider, Evelina Dun, FNP is located in their office at time of visit.  I discussed the limitations, risks, security and privacy concerns of performing an evaluation and management service by telephone and the availability of in person appointments. I also discussed with the patient that there may be a patient responsible charge related to this service. The patient expressed understanding and agreed to proceed.   History and Present Illness:  Pt calls the office today with recurrent cough and SOB. He went to the ED on 08/23/20 and diagnosed with CAP. He was given Augmentin and doxycycline. He reports he completed these this morning.  Cough This is a recurrent problem. The current episode started 1 to 4 weeks ago. The problem has been waxing and waning. The cough is productive of sputum (green). Associated symptoms include myalgias and shortness of breath (with exertion ). Pertinent negatives include no chills, ear congestion, ear pain, fever, headaches or wheezing. The symptoms are aggravated by lying down. He has tried rest and OTC inhaler (Augmentin and doxcycline ) for the symptoms. The treatment provided mild relief.      Review of Systems  Constitutional: Negative for chills and fever.  HENT:  Negative for ear pain.   Respiratory: Positive for cough and shortness of breath (with exertion ). Negative for wheezing.   Musculoskeletal: Positive for myalgias.  Neurological: Negative for headaches.  All other systems reviewed and are negative.    Observations/Objective: No SOB or distress noted  Assessment and Plan: 1. Community acquired pneumonia of left lung, unspecified part of lung - CBC with Differential/Platelet - DG Chest 2 View; Future - CMP14+EGFR  2. Hospital discharge follow-up Hospital notes reviewed  - CBC with Differential/Platelet - DG Chest 2 View; Future - CMP14+EGFR  3. Oral thrush Rinse after meals - nystatin (MYCOSTATIN) 100000 UNIT/ML suspension; Take 5 mLs (500,000 Units total) by mouth 4 (four) times daily.  Dispense: 60 mL; Refill: 0  Pt will go get chest x-ray today. Will hold off on antibiotics until chest-xray Labs pending      I discussed the assessment and treatment plan with the patient. The patient was provided an opportunity to ask questions and all were answered. The patient agreed with the plan and demonstrated an understanding of the instructions.   The patient was advised to call back or seek an in-person evaluation if the symptoms worsen or if the condition fails to improve as anticipated.  The above assessment and management plan was discussed with the patient. The patient verbalized understanding of and has agreed to the management plan. Patient is aware to call the clinic if symptoms persist or worsen. Patient is aware when to return to the clinic for a follow-up visit. Patient educated on when it is appropriate to go to the emergency department.   Time call ended:  4:01 pm  I provided 21 minutes of non-face-to-face time during this encounter.    Evelina Dun, FNP

## 2020-09-01 ENCOUNTER — Other Ambulatory Visit: Payer: Self-pay | Admitting: Family

## 2020-09-01 MED ORDER — LEVOFLOXACIN 750 MG PO TABS: 750.0000 mg | ORAL_TABLET | Freq: Every day | ORAL | 0 refills | Status: AC

## 2020-09-15 ENCOUNTER — Ambulatory Visit: Payer: Commercial Managed Care - PPO | Admitting: Family

## 2020-09-27 ENCOUNTER — Other Ambulatory Visit: Payer: Self-pay | Admitting: Family

## 2020-09-27 DIAGNOSIS — F339 Major depressive disorder, recurrent, unspecified: Secondary | ICD-10-CM

## 2020-09-29 ENCOUNTER — Telehealth: Payer: Self-pay | Admitting: *Deleted

## 2020-09-29 NOTE — Telephone Encounter (Signed)
Transition Care Management Unsuccessful Follow-up Telephone Call  Date of discharge and from where:  09/28/2020 [facility] ED  Attempts:  1st Attempt  Reason for unsuccessful TCM follow-up call:  Left voice message

## 2020-10-02 NOTE — Telephone Encounter (Signed)
Transition Care Management Unsuccessful Follow-up Telephone Call  Date of discharge and from where:  09/28/2020 [facility] ED  Attempts:  2nd Attempt  Reason for unsuccessful TCM follow-up call:  Left voice message

## 2020-10-03 NOTE — Telephone Encounter (Signed)
Transition Care Management Unsuccessful Follow-up Telephone Call  Date of discharge and from where:  09/28/2020 [facility] ED  Attempts:  3rd Attempt  Reason for unsuccessful TCM follow-up call:  No answer/busy

## 2020-10-09 ENCOUNTER — Ambulatory Visit (INDEPENDENT_AMBULATORY_CARE_PROVIDER_SITE_OTHER): Payer: Commercial Managed Care - PPO

## 2020-10-09 ENCOUNTER — Ambulatory Visit (INDEPENDENT_AMBULATORY_CARE_PROVIDER_SITE_OTHER): Payer: Commercial Managed Care - PPO | Admitting: Family

## 2020-10-09 ENCOUNTER — Other Ambulatory Visit: Payer: Self-pay

## 2020-10-09 ENCOUNTER — Encounter: Payer: Self-pay | Admitting: Family

## 2020-10-09 VITALS — BP 129/86 | HR 104 | Temp 97.9°F | Ht 70.0 in | Wt 198.0 lb

## 2020-10-09 DIAGNOSIS — E1142 Type 2 diabetes mellitus with diabetic polyneuropathy: Secondary | ICD-10-CM | POA: Insufficient documentation

## 2020-10-09 DIAGNOSIS — M48061 Spinal stenosis, lumbar region without neurogenic claudication: Secondary | ICD-10-CM

## 2020-10-09 DIAGNOSIS — E785 Hyperlipidemia, unspecified: Secondary | ICD-10-CM

## 2020-10-09 DIAGNOSIS — E1169 Type 2 diabetes mellitus with other specified complication: Secondary | ICD-10-CM | POA: Diagnosis not present

## 2020-10-09 DIAGNOSIS — M48062 Spinal stenosis, lumbar region with neurogenic claudication: Secondary | ICD-10-CM | POA: Diagnosis not present

## 2020-10-09 DIAGNOSIS — J189 Pneumonia, unspecified organism: Secondary | ICD-10-CM

## 2020-10-09 DIAGNOSIS — M5137 Other intervertebral disc degeneration, lumbosacral region: Secondary | ICD-10-CM | POA: Diagnosis not present

## 2020-10-09 DIAGNOSIS — F339 Major depressive disorder, recurrent, unspecified: Secondary | ICD-10-CM

## 2020-10-09 DIAGNOSIS — E663 Overweight: Secondary | ICD-10-CM

## 2020-10-09 LAB — BAYER DCA HB A1C WAIVED: HB A1C (BAYER DCA - WAIVED): 9.9 % — ABNORMAL HIGH (ref <7.0)

## 2020-10-09 MED ORDER — GABAPENTIN 800 MG PO TABS: ORAL_TABLET | ORAL | 1 refills | Status: DC

## 2020-10-09 NOTE — Patient Instructions (Signed)
Community-Acquired Pneumonia, Adult Pneumonia is an infection of the lungs. It causes irritation and swelling in the airways of the lungs. Mucus and fluid may also build up inside the airways. This may cause coughing and trouble breathing. One type of pneumonia can happen while you are in a hospital. A different type can happen when you are not in a hospital (community-acquired pneumonia). What are the causes? This condition is caused by germs (viruses, bacteria, or fungi). Some types of germs can spread from person to person. Pneumonia is not thought to spread from person to person.   What increases the risk? You are more likely to develop this condition if:  You have a long-term (chronic) disease, such as: ? Disease of the lungs. This may be chronic obstructive pulmonary disease (COPD) or asthma. ? Heart failure. ? Cystic fibrosis. ? Diabetes. ? Kidney disease. ? Sickle cell disease. ? HIV.  You have other health problems, such as: ? Your body's defense system (immune system) is weak. ? A condition that may cause you to breathe in fluids from your mouth and nose.  You had your spleen taken out.  You do not take good care of your teeth and mouth (poor dental hygiene).  You use or have used tobacco products.  You travel where the germs that cause this illness are common.  You are near certain animals or the places they live.  You are older than 54 years of age. What are the signs or symptoms? Symptoms of this condition include:  A cough.  A fever.  Sweating or chills.  Chest pain, often when you breathe deeply or cough.  Breathing problems, such as: ? Fast breathing. ? Trouble breathing. ? Shortness of breath.  Feeling tired (fatigued).  Muscle aches. How is this treated? Treatment for this condition depends on many things, such as:  The cause of your illness.  Your medicines.  Your other health problems. Most adults can be treated at home. Sometimes,  treatment must happen in a hospital.  Treatment may include medicines to kill germs.  Medicines may depend on which germ caused your illness. Very bad pneumonia is rare. If you get it, you may:  Have a machine to help you breathe.  Have fluid taken away from around your lungs. Follow these instructions at home: Medicines  Take over-the-counter and prescription medicines only as told by your doctor.  Take cough medicine only if you are losing sleep. Cough medicine can keep your body from taking mucus away from your lungs.  If you were prescribed an antibiotic medicine, take it as told by your doctor. Do not stop taking the antibiotic even if you start to feel better. Lifestyle  Do not drink alcohol.  Do not use any products that contain nicotine or tobacco, such as cigarettes, e-cigarettes, and chewing tobacco. If you need help quitting, ask your doctor.  Eat a healthy diet. This includes a lot of vegetables, fruits, whole grains, low-fat dairy products, and low-fat (lean) protein.      General instructions  Rest a lot. Sleep for at least 8 hours each night.  Sleep with your head and neck raised. Put a few pillows under your head or sleep in a reclining chair.  Return to your normal activities as told by your doctor. Ask your doctor what activities are safe for you.  Drink enough fluid to keep your pee (urine) pale yellow.  If your throat is sore, rinse your mouth often with salt water. To make salt   water, dissolve -1 tsp (3-6 g) of salt in 1 cup (237 mL) of warm water.  Keep all follow-up visits as told by your doctor. This is important.   How is this prevented? You can lower your risk of pneumonia by:  Getting the pneumonia shot (vaccine). These shots have different types and schedules. Ask your doctor what works best for you. Think about getting this shot if: ? You are older than 54 years of age. ? You are 19-65 years of age and:  You are being treated for  cancer.  You have long-term lung disease.  You have other problems that affect your body's defense system. Ask your doctor if you have one of these.  Getting your flu shot every year. Ask your doctor which type of shot is best for you.  Going to the dentist as often as told.  Washing your hands often with soap and water for at least 20 seconds. If you cannot use soap and water, use hand sanitizer. Contact a doctor if:  You have a fever.  You lose sleep because your cough medicine does not help. Get help right away if:  You are short of breath and this gets worse.  You have more chest pain.  Your sickness gets worse. This is very serious if: ? You are an older adult. ? Your body's defense system is weak.  You cough up blood. These symptoms may be an emergency. Do not wait to see if the symptoms will go away. Get medical help right away. Call your local emergency services (911 in the [place] Do not drive yourself to the hospital. Summary  Pneumonia is an infection of the lungs.  Community-acquired pneumonia affects people who have not been in the hospital. Certain germs can cause this infection.  This condition may be treated with medicines that kill germs.  For very bad pneumonia, you may need a hospital stay and treatment to help with breathing. This information is not intended to replace advice given to you by your health care provider. Make sure you discuss any questions you have with your health care provider. Document Revised: 05/25/2019 Document Reviewed: 05/25/2019 Elsevier Patient Education  2021 [employer]vier Inc.  

## 2020-10-09 NOTE — Progress Notes (Signed)
Subjective:    Patient ID: John Ware, male    DOB: 10/23/1965, 55 y.o.   MRN: [id number]  Chief Complaint  Patient presents with  . Depression   PT calls the office today for chronic follow up. He is followed by Pain Clinic every month for chronic back pain. Has hx of lumbar stenosis.   His A1C was elevated last visit and we started on Tresiba, but states insurance would not cover. He did not started, but has lost 20 lbs.   PT continues to complaining of SOB. He diagnosed with CAP in 08/23/20 and continues to have intermittent SOB and early morning cough. He has completed 4 rounds of antibiotics and steroids with mild improvement.   He had an xray on 09/28/20 that showed improvements, but still there.  Depression        This is a chronic problem.  The onset quality is gradual.   Associated symptoms include irritable, restlessness, decreased interest and sad.  Associated symptoms include no helplessness and no hopelessness. Hyperlipidemia This is a chronic problem. The current episode started more than 1 year ago. The problem is controlled. Recent lipid tests were reviewed and are normal. He is currently on no antihyperlipidemic treatment. The current treatment provides no improvement of lipids. Risk factors for coronary artery disease include dyslipidemia, diabetes mellitus, male sex, hypertension and a sedentary lifestyle.  Diabetes He presents for his follow-up diabetic visit. He has type 2 diabetes mellitus. His disease course has been stable. There are no hypoglycemic associated symptoms. Associated symptoms include foot paresthesias. Pertinent negatives for diabetes include no blurred vision. There are no hypoglycemic complications. Symptoms are stable. Diabetic complications include peripheral neuropathy. Pertinent negatives for diabetic complications include no CVA. Risk factors for coronary artery disease include diabetes mellitus, dyslipidemia, hypertension, male sex and  sedentary lifestyle. (Does not check BS at home ) An ACE inhibitor/angiotensin II receptor blocker is being taken. Eye exam is not current.  Arthritis Presents for follow-up visit. He complains of pain and stiffness. The symptoms have been stable.  Back Pain This is a chronic problem. The current episode started more than 1 year ago. The problem occurs intermittently. The problem has been waxing and waning since onset. The pain is present in the lumbar spine. The quality of the pain is described as aching. The pain is at a severity of 3/10. The pain is moderate.      Review of Systems  Eyes: Negative for blurred vision.  Musculoskeletal: Positive for arthritis, back pain and stiffness.  Psychiatric/Behavioral: Positive for depression.  All other systems reviewed and are negative.      Objective:   Physical Exam Vitals reviewed.  Constitutional:      General: He is irritable. He is not in acute distress.    Appearance: He is well-developed and well-nourished.  HENT:     Head: Normocephalic.     Right Ear: Tympanic membrane normal.     Left Ear: Tympanic membrane normal.     Mouth/Throat:     Mouth: Oropharynx is clear and moist.  Eyes:     General:        Right eye: No discharge.        Left eye: No discharge.     Pupils: Pupils are equal, round, and reactive to light.  Neck:     Thyroid: No thyromegaly.  Cardiovascular:     Rate and Rhythm: Normal rate and regular rhythm.     Pulses: Intact distal pulses.  Heart sounds: Normal heart sounds. No murmur heard.   Pulmonary:     Effort: Pulmonary effort is normal. No respiratory distress.     Breath sounds: Normal breath sounds. No wheezing.  Abdominal:     General: Bowel sounds are normal. There is no distension.     Palpations: Abdomen is soft.     Tenderness: There is no abdominal tenderness.  Musculoskeletal:        General: No tenderness or edema. Normal range of motion.     Cervical back: Normal range of motion  and neck supple.  Skin:    General: Skin is warm and dry.     Findings: No erythema or rash.  Neurological:     Mental Status: He is alert and oriented to person, place, and time.     Cranial Nerves: No cranial nerve deficit.     Deep Tendon Reflexes: Reflexes are normal and symmetric.  Psychiatric:        Mood and Affect: Mood and affect normal.        Behavior: Behavior normal.        Thought Content: Thought content normal.        Judgment: Judgment normal.       BP 129/86   Pulse (!) 104   Temp 97.9 F (36.6 C) (Temporal)   Ht _0  (1.778 m)   Wt 198 lb (89.8 kg)   SpO2 97%   BMI 28.41 kg/m      Assessment & Plan:  John Ware comes in today with chief complaint of Depression   Diagnosis and orders addressed:  1. Spinal stenosis of lumbar region, unspecified whether neurogenic claudication present - gabapentin (NEURONTIN) 800 MG tablet; TAKE (1) TABLET THREE TIMES DAILY.  Dispense: 90 tablet; Refill: 1 - CMP14+EGFR - CBC with Differential/Platelet  2. DDD (degenerative disc disease), lumbosacral - gabapentin (NEURONTIN) 800 MG tablet; TAKE (1) TABLET THREE TIMES DAILY.  Dispense: 90 tablet; Refill: 1 - CMP14+EGFR - CBC with Differential/Platelet  3. Spinal stenosis of lumbar region with neurogenic claudication - gabapentin (NEURONTIN) 800 MG tablet; TAKE (1) TABLET THREE TIMES DAILY.  Dispense: 90 tablet; Refill: 1 - CMP14+EGFR - CBC with Differential/Platelet  4. Hyperlipidemia associated with type 2 diabetes mellitus (HCC) - CMP14+EGFR - CBC with Differential/Platelet  5. Type 2 diabetes mellitus with other specified complication, without long-term current use of insulin (HCC) - Bayer DCA Hb A1c Waived - CMP14+EGFR - CBC with Differential/Platelet  6. Depression, recurrent ([place]) - CMP14+EGFR - CBC with Differential/Platelet  7. Overweight (BMI 25.0-29.9) - CMP14+EGFR - CBC with Differential/Platelet  8. Diabetic polyneuropathy associated  with type 2 diabetes mellitus (HCC) - CMP14+EGFR - CBC with Differential/Platelet  9. Community acquired pneumonia of left lung, unspecified part of lung -X-ray pending, if still positive will need CT scan - CMP14+EGFR - CBC with Differential/Platelet - DG Chest 2 View; Future   Labs pending Health Maintenance reviewed Diet and exercise encouraged  Follow up plan: 1 month    Evelina Dun, FNP

## 2020-10-10 ENCOUNTER — Other Ambulatory Visit: Payer: Self-pay | Admitting: Family

## 2020-10-10 DIAGNOSIS — R0602 Shortness of breath: Secondary | ICD-10-CM

## 2020-10-10 LAB — CBC WITH DIFFERENTIAL/PLATELET
Basophils Absolute: 0 10*3/uL (ref 0.0–0.2)
Basos: 0 %
EOS (ABSOLUTE): 0.1 10*3/uL (ref 0.0–0.4)
Eos: 1 %
Hematocrit: 46.9 % (ref 37.5–51.0)
Hemoglobin: 16.3 g/dL (ref 13.0–17.7)
Immature Grans (Abs): 0 10*3/uL (ref 0.0–0.1)
Immature Granulocytes: 0 %
Lymphocytes Absolute: 1.9 10*3/uL (ref 0.7–3.1)
Lymphs: 22 %
MCH: 30.7 pg (ref 26.6–33.0)
MCHC: 34.8 g/dL (ref 31.5–35.7)
MCV: 88 fL (ref 79–97)
Monocytes Absolute: 0.6 10*3/uL (ref 0.1–0.9)
Monocytes: 7 %
Neutrophils Absolute: 5.9 10*3/uL (ref 1.4–7.0)
Neutrophils: 70 %
Platelets: 365 10*3/uL (ref 150–450)
RBC: 5.31 x10E6/uL (ref 4.14–5.80)
RDW: 12.3 % (ref 11.6–15.4)
WBC: 8.6 10*3/uL (ref 3.4–10.8)

## 2020-10-10 LAB — CMP14+EGFR
ALT: 21 IU/L (ref 0–44)
AST: 17 IU/L (ref 0–40)
Albumin/Globulin Ratio: 1.6 (ref 1.2–2.2)
Albumin: 4.7 g/dL (ref 3.8–4.9)
Alkaline Phosphatase: 113 IU/L (ref 44–121)
BUN/Creatinine Ratio: 12 (ref 9–20)
BUN: 11 mg/dL (ref 6–24)
Bilirubin Total: 0.8 mg/dL (ref 0.0–1.2)
CO2: 22 mmol/L (ref 20–29)
Calcium: 10.2 mg/dL (ref 8.7–10.2)
Chloride: 96 mmol/L (ref 96–106)
Creatinine, Ser: 0.89 mg/dL (ref 0.76–1.27)
GFR calc Af Amer: 113 mL/min/{1.73_m2} (ref >59)
GFR calc non Af Amer: 98 mL/min/{1.73_m2} (ref >59)
Globulin, Total: 2.9 g/dL (ref 1.5–4.5)
Glucose: 308 mg/dL — ABNORMAL HIGH (ref 65–99)
Potassium: 4.9 mmol/L (ref 3.5–5.2)
Sodium: 137 mmol/L (ref 134–144)
Total Protein: 7.6 g/dL (ref 6.0–8.5)

## 2020-10-10 LAB — LIPID PANEL
Chol/HDL Ratio: 3.2 ratio (ref 0.0–5.0)
Cholesterol, Total: 220 mg/dL — ABNORMAL HIGH (ref 100–199)
HDL: 68 mg/dL (ref >39)
LDL Chol Calc (NIH): 126 mg/dL — ABNORMAL HIGH (ref 0–99)
Triglycerides: 146 mg/dL (ref 0–149)
VLDL Cholesterol Cal: 26 mg/dL (ref 5–40)

## 2020-10-10 LAB — SPECIMEN STATUS REPORT

## 2020-10-10 MED ORDER — EMPAGLIFLOZIN 10 MG PO TABS: 10.0000 mg | ORAL_TABLET | Freq: Every day | ORAL | 1 refills | Status: DC

## 2020-10-10 MED ORDER — ROSUVASTATIN CALCIUM 10 MG PO TABS: 10.0000 mg | ORAL_TABLET | Freq: Every day | ORAL | 3 refills | Status: DC

## 2020-10-13 ENCOUNTER — Telehealth: Payer: Self-pay

## 2020-10-16 NOTE — Telephone Encounter (Signed)
Patient found a $10 coupon and got medication. FYI

## 2020-10-16 NOTE — Telephone Encounter (Signed)
Samples up front. Left detailed message on patients voicemail 

## 2020-10-16 NOTE — Telephone Encounter (Signed)
Do we have samples of Jardiance? Can we do patient assistance for him?

## 2020-10-20 ENCOUNTER — Other Ambulatory Visit (HOSPITAL_COMMUNITY)
Admission: RE | Admit: 2020-10-20 | Discharge: 2020-10-20 | Disposition: A | Discharge status: Home / Self Care | Payer: Commercial Managed Care - PPO | Source: Ambulatory Visit | Attending: Internal Medicine | Admitting: Internal Medicine

## 2020-10-20 ENCOUNTER — Encounter: Payer: Self-pay | Admitting: Internal Medicine

## 2020-10-20 ENCOUNTER — Ambulatory Visit (INDEPENDENT_AMBULATORY_CARE_PROVIDER_SITE_OTHER): Payer: Commercial Managed Care - PPO | Admitting: Internal Medicine

## 2020-10-20 ENCOUNTER — Other Ambulatory Visit: Payer: Self-pay

## 2020-10-20 VITALS — BP 138/80 | HR 101 | Temp 97.4°F | Ht 70.0 in | Wt 200.8 lb

## 2020-10-20 DIAGNOSIS — R0600 Dyspnea, unspecified: Secondary | ICD-10-CM | POA: Diagnosis present

## 2020-10-20 DIAGNOSIS — R0609 Other forms of dyspnea: Secondary | ICD-10-CM

## 2020-10-20 LAB — D-DIMER, QUANTITATIVE: D-Dimer, Quant: 0.53 ug/mL-FEU — ABNORMAL HIGH (ref 0.00–0.50)

## 2020-10-20 LAB — TSH: TSH: 2.063 u[IU]/mL (ref 0.350–4.500)

## 2020-10-20 LAB — SEDIMENTATION RATE: Sed Rate: 22 mm/hr — ABNORMAL HIGH (ref 0–16)

## 2020-10-20 NOTE — Progress Notes (Signed)
John Ware, male    DOB: Mar 28, 1966    MRN: [id number]   Brief patient profile:  88 yowm never vaccinated for covid19  with h/o dm assoc with peak wt 296 and h/o recurrent "pna" until quit smoking in 2012 and did fine around Aug 19 2020 fever, cough, fatigue, L side pain / neg covid/ all acute symptoms resolved x cp an doe   so referred to pulmonary clinic in [place]  10/20/2020 by Jannifer Rodney      History of Present Illness  10/20/2020  Pulmonary/ 1st office eval/ Taneshia Lorence / [facility] Office  Chief Complaint  Patient presents with  . Consult    Pain when breathes deep on left side of back  Dyspnea: walks dogs  0.7 miles sob with hills - does twice daily  Cough: gone  Sleep: able to lie flat SABA use: none  Still some discomfort with deep breath but improving since onset   No obvious day to day or daytime variability or assoc excess/ purulent sputum or mucus plugs or hemoptysis or cp or chest tightness, subjective wheeze or overt sinus or hb symptoms.   sleepoing as above  without nocturnal  or early am exacerbation  of respiratory  c/o's or need for noct saba. Also denies any obvious fluctuation of symptoms with weather or environmental changes or other aggravating or alleviating factors except as outlined above   No unusual exposure hx or h/o childhood pna/ asthma or knowledge of premature birth.  Current Allergies, Complete Past Medical History, Past Surgical History, Family History, and Social History were reviewed in [employer] record.  ROS  The following are not active complaints unless bolded Hoarseness, sore throat, dysphagia, dental problems, itching, sneezing,  nasal congestion or discharge of excess mucus or purulent secretions, ear ache,   fever, chills, sweats, unintended wt loss or wt gain, classically pleuritic or exertional cp,  orthopnea pnd or arm/hand swelling  or leg swelling, presyncope, palpitations, abdominal pain, anorexia, nausea,  vomiting, diarrhea  or change in bowel habits or change in bladder habits, change in stools or change in urine, dysuria, hematuria,  rash, arthralgias, visual complaints, headache, numbness, weakness or ataxia or problems with walking or coordination,  change in mood or  memory.           Past Medical History:  Diagnosis Date  . Depression   . Diabetes mellitus without complication (HCC)   . Hypertension   . Spinal stenosis    With Neurogenic Claudication    Outpatient Medications Prior to Visit  Medication Sig Dispense Refill  . amLODipine (NORVASC) 5 MG tablet Take 1 tablet (5 mg total) by mouth daily. 90 tablet 3  . buPROPion (WELLBUTRIN XL) 150 MG 24 hr tablet TAKE (1) TABLET TWICE A DAY. 30 tablet 1  . busPIRone (BUSPAR) 5 MG tablet Take 1 tablet (5 mg total) by mouth 3 (three) times daily. (Needs to be seen before next refill) 90 tablet 0  . empagliflozin (JARDIANCE) 10 MG TABS tablet Take 1 tablet (10 mg total) by mouth daily before breakfast. 90 tablet 1  . gabapentin (NEURONTIN) 800 MG tablet TAKE (1) TABLET THREE TIMES DAILY. 90 tablet 1  . metFORMIN (GLUCOPHAGE XR) 750 MG 24 hr tablet Take 2 tablets (1,500 mg total) by mouth daily with breakfast. 180 tablet 2  . oxyCODONE ER (XTAMPZA ER) 18 MG C12A     . oxyCODONE-acetaminophen (PERCOCET) 7.5-325 MG tablet Take 1 tablet by mouth 3 (three) times  daily as needed.    . ramipril (ALTACE) 10 MG capsule TAKE (1) CAPSULE DAILY 90 capsule 3  . rosuvastatin (CRESTOR) 10 MG tablet Take 1 tablet (10 mg total) by mouth daily. 90 tablet 3  . sertraline (ZOLOFT) 100 MG tablet Take 1 tablet (100 mg total) by mouth daily. 30 tablet 5  . nystatin (MYCOSTATIN) 100000 UNIT/ML suspension Take 5 mLs (500,000 Units total) by mouth 4 (four) times daily. 60 mL 0   No facility-administered medications prior to visit.     Objective:     BP 138/80 (BP Location: Left Arm, Cuff Size: Normal)   Pulse (!) 101   Temp (!) 97.4 F (36.3 C) (Temporal)    Ht 5\' 10"  (1.778 m)   Wt 200 lb 12.8 oz (91.1 kg)   SpO2 98% Comment: Room air  BMI 28.81 kg/m   SpO2: 98 % (Room air)    Pleasant amb wm nad   HEENT : pt wearing mask not removed for exam due to covid -19 concerns.    NECK :  without JVD/Nodes/TM/ nl carotid upstrokes bilaterally   LUNGS: no acc muscle use,  Nl contour chest which is clear to A and P bilaterally without cough on insp or exp maneuvers   CV:  RRR  no s3 or murmur or increase in P2, and no edema   ABD:  soft and nontender with nl inspiratory excursion in the supine position. No bruits or organomegaly appreciated, bowel sounds nl  MS:  Nl gait/ ext warm without deformities, calf tenderness, cyanosis or clubbing No obvious joint restrictions   SKIN: warm and dry without lesions    NEURO:  alert, approp, nl sensorium with  no motor or cerebellar deficits apparent.     I personally reviewed images and agree with radiology impression as follows:  CXR:   10/09/20  Resolution of previously identified LEFT lower lobe pneumonia.  Labs ordered/ reviewed:      Chemistry      Component Value Date/Time   NA 137 10/09/2020 1155   K 4.9 10/09/2020 1155   CL 96 10/09/2020 1155   CO2 22 10/09/2020 1155   BUN 11 10/09/2020 1155   CREATININE 0.89 10/09/2020 1155      Component Value Date/Time   CALCIUM 10.2 10/09/2020 1155   ALKPHOS 113 10/09/2020 1155   AST 17 10/09/2020 1155   ALT 21 10/09/2020 1155   BILITOT 0.8 10/09/2020 1155        Lab Results  Component Value Date   WBC 8.6 10/09/2020   HGB 16.3 10/09/2020   HCT 46.9 10/09/2020   MCV 88 10/09/2020   PLT 365 10/09/2020     Lab Results  Component Value Date   DDIMER 0.53 (H) 10/20/2020      Lab Results  Component Value Date   TSH 2.063 10/20/2020          Lab Results  Component Value Date   ESRSEDRATE 22 (H) 10/20/2020       Labs ordered 10/20/2020  :   alpha one AT phenotype  Sars Cov 2 IgG        Assessment   No  problem-specific Assessment & Plan notes found for this encounter.     10/22/2020, MD 10/20/2020

## 2020-10-20 NOTE — Patient Instructions (Signed)
Make sure you check your oxygen saturation at your highest level of activity to be sure it stays over 90% and keep track of it at least once a week, more often if breathing getting worse, and let me know if losing ground.   If breathing problems or cough get worse, you may need a 6 week trial off the altace before additional testing  Please remember to go to the lab department @ [facility] for your tests - we will call you with the results when they are available.       Please schedule a follow up office visit in 6 weeks, call sooner if needed PFTs on return

## 2020-10-20 NOTE — Assessment & Plan Note (Addendum)
Onset Aug 19 2020 with viral syndrome but neg covid testing and as dz LLL > resolved by 10/09/20 f/u -  10/20/2020   Walked RA  approx   600 ft  @ fast pace  stopped due to  Sob @ 500 band sats still 99% at end    No significant residual findings to explain doe or vague discomfort L chest which has gradually eased since onset and is c/w pleuritis/ residual pleural scarring despite nl cxr   Re  D dimer  high normal value (seen commonly in the elderly or chronically ill)  may miss small peripheral pe, the clot burden with sob is moderately high and  His d dimer of 0.53   has a very high neg pred value if used in this setting.    rec Sub max ex 30 min or more daily  D/c acei if atypical symptoms continue   pfts in 6 weeks to complete the w/u as he is former smoker    Each maintenance medication was reviewed in detail including emphasizing most importantly the difference between maintenance and prns and under what circumstances the prns are to be triggered using an action plan format where appropriate.  Total time for H and P, chart review, counseling,  directly observing portions of ambulatory 02 saturation study/ and generating customized AVS unique to this office visit / same day charting = 61 min

## 2020-10-23 ENCOUNTER — Encounter: Payer: Self-pay | Admitting: *Deleted

## 2020-10-23 LAB — ALPHA-1 ANTITRYPSIN PHENOTYPE: A-1 Antitrypsin, Ser: 139 mg/dL (ref 101–187)

## 2020-10-26 ENCOUNTER — Telehealth: Payer: Self-pay | Admitting: Internal Medicine

## 2020-10-26 ENCOUNTER — Other Ambulatory Visit: Payer: Self-pay | Admitting: Family

## 2020-10-26 DIAGNOSIS — F339 Major depressive disorder, recurrent, unspecified: Secondary | ICD-10-CM

## 2020-10-26 DIAGNOSIS — R0609 Other forms of dyspnea: Secondary | ICD-10-CM

## 2020-10-26 DIAGNOSIS — R0600 Dyspnea, unspecified: Secondary | ICD-10-CM

## 2020-10-26 NOTE — Telephone Encounter (Signed)
PT has declined to complete a PFT at this time.

## 2020-10-31 ENCOUNTER — Ambulatory Visit: Payer: Commercial Managed Care - PPO | Admitting: Family

## 2020-11-06 ENCOUNTER — Ambulatory Visit: Payer: Commercial Managed Care - PPO | Admitting: Family

## 2020-11-13 LAB — COLOGUARD: Cologuard: POSITIVE — AB

## 2020-11-14 ENCOUNTER — Other Ambulatory Visit: Payer: Self-pay | Admitting: Family

## 2020-11-14 DIAGNOSIS — R195 Other fecal abnormalities: Secondary | ICD-10-CM

## 2020-11-16 ENCOUNTER — Other Ambulatory Visit: Payer: Self-pay | Admitting: Family

## 2020-11-17 ENCOUNTER — Other Ambulatory Visit: Payer: Self-pay | Admitting: Family

## 2020-11-17 DIAGNOSIS — F339 Major depressive disorder, recurrent, unspecified: Secondary | ICD-10-CM

## 2020-11-22 ENCOUNTER — Encounter: Payer: Self-pay | Admitting: Internal Medicine

## 2020-12-01 ENCOUNTER — Ambulatory Visit: Payer: Commercial Managed Care - PPO | Admitting: Internal Medicine

## 2020-12-21 ENCOUNTER — Telehealth: Payer: Self-pay | Admitting: Family Medicine

## 2020-12-28 ENCOUNTER — Ambulatory Visit (INDEPENDENT_AMBULATORY_CARE_PROVIDER_SITE_OTHER): Payer: Commercial Managed Care - PPO | Admitting: Nurse Practitioner

## 2020-12-28 ENCOUNTER — Encounter: Payer: Self-pay | Admitting: Nurse Practitioner

## 2020-12-28 ENCOUNTER — Other Ambulatory Visit: Payer: Self-pay

## 2020-12-28 DIAGNOSIS — R195 Other fecal abnormalities: Secondary | ICD-10-CM | POA: Diagnosis not present

## 2020-12-28 NOTE — Progress Notes (Signed)
Primary Care Physician:  Sharion Balloon, FNP Primary Gastroenterologist:  Dr. Gala Romney  Chief Complaint  Patient presents with  . positive cologuard    Never had tcs. No fhcrc    HPI:   John Ware is a 55 y.o. male who presents on referral from primary care for positive Cologuard.  It appears Cologuard was completed 11/02/2020 and found to be positive.  No history of colonoscopy found in our system.  Today he states he is doing okay overall.  He states he has never had a colonoscopy before, no family history of colon cancer.  His wife is getting her first colonoscopy tomorrow. Denies abdominal pain, N/V, hematochezia, melena, fever, chills, unintentional weight loss. Denies URI or flu-like symptoms. Denies loss of sense of taste or smell.  The patient has received COVID-19 vaccination(s). Denies chest pain, dyspnea, dizziness, lightheadedness, syncope, near syncope. Denies any other upper or lower GI symptoms.  Has DM poorly controlled (last a1c was 9.9 on 10/09/20).  Past Medical History:  Diagnosis Date  . Depression   . Diabetes mellitus without complication ([place])   . Hypertension   . Spinal stenosis    With Neurogenic Claudication    Past Surgical History:  Procedure Laterality Date  . CARPAL TUNNEL RELEASE Bilateral   . LUMBAR LAMINECTOMY/DECOMPRESSION MICRODISCECTOMY N/A 12/13/2014   Procedure: LUMBAR THREE-FOUR, LUMBAR FOUR-FIVE, LUMBAR FIVE-SACRAL ONE LAMINECTOMY;  Surgeon: Kristeen Miss, MD;  Location: [facility];  Service: Neurosurgery;  Laterality: N/A;  L3-4 L4-5 L5-S1 Laminectomy  . MENISCUS REPAIR     Right knee  . 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 VASECTOMY      Current Outpatient Medications  Medication Sig Dispense Refill  . amLODipine (NORVASC) 5 MG tablet Take 1 tablet (5 mg total) by mouth daily. 90 tablet 3  . buPROPion (WELLBUTRIN XL) 150 MG 24 hr tablet TAKE (1) TABLET TWICE A DAY. 30 tablet 1  . busPIRone (BUSPAR) 5 MG  tablet TAKE 1 TABLET 3 TIMES A DAY 90 tablet 1  . empagliflozin (JARDIANCE) 10 MG TABS tablet Take 1 tablet (10 mg total) by mouth daily before breakfast. 90 tablet 1  . gabapentin (NEURONTIN) 800 MG tablet TAKE (1) TABLET THREE TIMES DAILY. (Patient taking differently: Take 800 mg by mouth as needed. TAKE (1) TABLET THREE TIMES DAILY.) 90 tablet 1  . metFORMIN (GLUCOPHAGE XR) 750 MG 24 hr tablet Take 2 tablets (1,500 mg total) by mouth daily with breakfast. 180 tablet 2  . oxyCODONE ER (XTAMPZA ER) 18 MG C12A Take 1 tablet by mouth every 12 (twelve) hours.    Marland Kitchen oxyCODONE-acetaminophen (PERCOCET) 7.5-325 MG tablet Take 1 tablet by mouth 3 (three) times daily as needed.    . ramipril (ALTACE) 10 MG capsule TAKE (1) CAPSULE DAILY 90 capsule 3  . sertraline (ZOLOFT) 100 MG tablet Take 1 tablet (100 mg total) by mouth daily. 30 tablet 5  . rosuvastatin (CRESTOR) 10 MG tablet Take 1 tablet (10 mg total) by mouth daily. (Patient not taking: Reported on 12/28/2020) 90 tablet 3   No current facility-administered medications for this visit.    Allergies as of 12/28/2020 - Review Complete 12/28/2020  Allergen Reaction Noted  . Tizanidine  09/19/2017    Family History  Problem Relation Age of Onset  . Colon cancer Neg Hx     Social History   Socioeconomic History  . Marital status: Married    Spouse name: Not on  file  . Number of children: Not on file  . Years of education: Not on file  . Highest education level: Not on file  Occupational History  . Not on file  Tobacco Use  . Smoking status: Former Smoker    Packs/day: 2.00    Years: 15.00    Pack years: 30.00    Types: Cigarettes    Quit date: 2012    Years since quitting: 10.3  . Smokeless tobacco: Never Used  Vaping Use  . Vaping Use: Never used  Substance and Sexual Activity  . Alcohol use: Yes    Comment: occasional  . Drug use: No  . Sexual activity: Not on file  Other Topics Concern  . Not on file  Social History  Narrative  . Not on file   Social Determinants of Health   Financial Resource Strain: Not on file  Food Insecurity: Not on file  Transportation Needs: Not on file  Physical Activity: Not on file  Stress: Not on file  Social Connections: Not on file  Intimate Partner Violence: Not on file    Subjective: Review of Systems  Constitutional: Negative for chills, fever, malaise/fatigue and weight loss.  HENT: Negative for congestion and sore throat.   Respiratory: Negative for cough and shortness of breath.   Cardiovascular: Negative for chest pain and palpitations.  Gastrointestinal: Negative for abdominal pain, blood in stool, constipation, diarrhea, heartburn, melena, nausea and vomiting.  Musculoskeletal: Negative for joint pain and myalgias.  Skin: Negative for rash.  Neurological: Negative for dizziness and weakness.  Endo/Heme/Allergies: Does not bruise/bleed easily.  Psychiatric/Behavioral: Negative for depression. The patient is not nervous/anxious.   All other systems reviewed and are negative.      Objective: BP 134/76   Pulse 100   Temp (!) 97.3 F (36.3 C) (Temporal)   Ht _0  (1.778 m)   Wt 212 lb 3.2 oz (96.3 kg)   BMI 30.45 kg/m  Physical Exam Vitals and nursing note reviewed.  Constitutional:      General: He is not in acute distress.    Appearance: Normal appearance. He is obese. He is not ill-appearing, toxic-appearing or diaphoretic.  HENT:     Head: Normocephalic and atraumatic.     Nose: No congestion or rhinorrhea.  Eyes:     General: No scleral icterus. Cardiovascular:     Rate and Rhythm: Normal rate and regular rhythm.     Heart sounds: Normal heart sounds.  Pulmonary:     Effort: Pulmonary effort is normal.     Breath sounds: Normal breath sounds.  Abdominal:     General: Bowel sounds are normal. There is no distension.     Palpations: Abdomen is soft. There is no hepatomegaly, splenomegaly or mass.     Tenderness: There is no  abdominal tenderness. There is no guarding or rebound.     Hernia: No hernia is present.  Musculoskeletal:     Cervical back: Neck supple.  Skin:    General: Skin is warm and dry.     Coloration: Skin is not jaundiced.     Findings: No bruising or rash.  Neurological:     General: No focal deficit present.     Mental Status: He is alert and oriented to person, place, and time. Mental status is at baseline.  Psychiatric:        Mood and Affect: Mood normal.        Behavior: Behavior normal.  Thought Content: Thought content normal.      Assessment:  Very pleasant 55 year old male who presents for positive Cologuard to schedule first ever colonoscopy.  No family history of colon cancer.  He is asymptomatic from a GI standpoint.  Denies any obvious rectal bleeding.  At this point we will proceed with colonoscopy.   Proceed with colonoscopy on propofol/MAC by Dr. Gala Romney in near future: the risks, benefits, and alternatives have been discussed with the patient in detail. The patient states understanding and desires to proceed.  The patient is currently on Wellbutrin, BuSpar, Jardiance, Neurontin, Glucophage, oxycodone, Zoloft. The patient is not on any other anticoagulants, anxiolytics, chronic pain medications, antidepressants, antidiabetics, or iron supplements.  We will plan for the procedure on propofol/MAC to promote adequate sedation.  ASA III (poorly controlled DM)   Plan: 1. Colonoscopy as described above 2. Return for follow-up as needed for GI symptoms or based on post procedure recommendations    Thank you for allowing [place] to participate in the care of Dywane L Rawe  Walden Field, DNP, AGNP-C Adult & Gerontological Nurse Practitioner [facility]   12/28/2020 3:04 PM   Disclaimer: This note was dictated with voice recognition software. Similar sounding words can inadvertently be transcribed and may not be corrected upon review.

## 2020-12-28 NOTE — Progress Notes (Signed)
CC'ED TO PCP 

## 2020-12-28 NOTE — Patient Instructions (Signed)
Your health issues we discussed today were:   Need for colonoscopy due to positive Cologuard test: 1. I am glad you are feeling well! 2. We will schedule your colonoscopy for you 3. Further recommendations will follow your colonoscopy 4. Call us if you have any questions or problems with the bowel prep  Overall I recommend:  1. Continue other current medications 2. Return for follow-up based on recommendations made after your procedure, or as needed for GI symptoms 3. Call us if he has any questions or concerns   ---------------------------------------------------------------  I am glad you have gotten your COVID-19 vaccination!  Even though you are fully vaccinated you should continue to follow CDC and state/local guidelines.  ---------------------------------------------------------------   At [facility] we value your feedback. You may receive a survey about your visit today. Please share your experience as we strive to create trusting relationships with our patients to provide genuine, compassionate, quality care.  We appreciate your understanding and patience as we review any laboratory studies, imaging, and other diagnostic tests that are ordered as we care for you. Our office policy is 5 business days for review of these results, and any emergent or urgent results are addressed in a timely manner for your best interest. If you do not hear from our office in 1 week, please contact us.   We also encourage the use of MyChart, which contains your medical information for your review as well. If you are not enrolled in this feature, an access code is on this after visit summary for your convenience. Thank you for allowing [place] to be involved in your care.  It was great to see you today!  I hope you have a great summer!!

## 2021-01-03 ENCOUNTER — Encounter: Payer: Self-pay | Admitting: Family Medicine

## 2021-01-16 ENCOUNTER — Ambulatory Visit (INDEPENDENT_AMBULATORY_CARE_PROVIDER_SITE_OTHER): Payer: Commercial Managed Care - PPO | Admitting: Family

## 2021-01-16 ENCOUNTER — Encounter: Payer: Self-pay | Admitting: Family

## 2021-01-16 VITALS — BP 132/80

## 2021-01-16 DIAGNOSIS — E663 Overweight: Secondary | ICD-10-CM

## 2021-01-16 DIAGNOSIS — E1142 Type 2 diabetes mellitus with diabetic polyneuropathy: Secondary | ICD-10-CM

## 2021-01-16 DIAGNOSIS — E1169 Type 2 diabetes mellitus with other specified complication: Secondary | ICD-10-CM | POA: Diagnosis not present

## 2021-01-16 DIAGNOSIS — M5136 Other intervertebral disc degeneration, lumbar region: Secondary | ICD-10-CM | POA: Diagnosis not present

## 2021-01-16 DIAGNOSIS — E785 Hyperlipidemia, unspecified: Secondary | ICD-10-CM

## 2021-01-16 DIAGNOSIS — F339 Major depressive disorder, recurrent, unspecified: Secondary | ICD-10-CM

## 2021-01-16 DIAGNOSIS — M48062 Spinal stenosis, lumbar region with neurogenic claudication: Secondary | ICD-10-CM

## 2021-01-16 NOTE — Progress Notes (Signed)
Virtual Visit  Note Due to COVID-19 pandemic this visit was conducted virtually. This visit type was conducted due to national recommendations for restrictions regarding the COVID-19 Pandemic (e.g. social distancing, sheltering in place) in an effort to limit this patient's exposure and mitigate transmission in our community. All issues noted in this document were discussed and addressed.  A physical exam was not performed with this format.  I connected with John Ware on 01/16/21 at 3:00 pm  by telephone and verified that I am speaking with the correct person using two identifiers. John Ware is currently located at home  and no one is currently with him  during visit. The provider, Evelina Dun, FNP is located in their office at time of visit.  I discussed the limitations, risks, security and privacy concerns of performing an evaluation and management service by telephone and the availability of in person appointments. I also discussed with the patient that there may be a patient responsible charge related to this service. The patient expressed understanding and agreed to proceed.   History and Present Illness: PT calls the office today for chronic follow up. He is followed by Pain Clinic every monthfor chronic back pain. Has hx of lumbar stenosis.  He reports he was diagnosed with COVID on Friday and unable to come to his appointment in person today.  Diabetes He presents for his follow-up diabetic visit. He has type 2 diabetes mellitus. There are no hypoglycemic associated symptoms. Associated symptoms include fatigue and foot paresthesias. Pertinent negatives for diabetes include no blurred vision. There are no hypoglycemic complications. Symptoms are stable. Diabetic complications include heart disease. Risk factors for coronary artery disease include dyslipidemia, diabetes mellitus, male sex and sedentary lifestyle. He is following a generally healthy diet. (Does not check at  home) Eye exam is not current.  Hyperlipidemia This is a chronic problem. The current episode started more than 1 year ago. The problem is controlled. Recent lipid tests were reviewed and are normal. Current antihyperlipidemic treatment includes diet change. The current treatment provides no improvement of lipids. Risk factors for coronary artery disease include dyslipidemia, male sex, hypertension and a sedentary lifestyle.  Back Pain This is a chronic problem. The current episode started more than 1 year ago. The problem occurs intermittently. The problem has been waxing and waning since onset. The pain is present in the lumbar spine. The quality of the pain is described as aching. The pain is at a severity of 2/10. The pain is moderate.  Depression        This is a chronic problem.  The current episode started more than 1 year ago.   The onset quality is gradual.   The problem occurs intermittently.  Associated symptoms include fatigue, helplessness, hopelessness, irritable, restlessness and sad.  Past treatments include SSRIs - Selective serotonin reuptake inhibitors. Hypertension This is a chronic problem. The current episode started more than 1 year ago. The problem has been resolved since onset. The problem is controlled. Pertinent negatives include no blurred vision, malaise/fatigue or peripheral edema. Risk factors for coronary artery disease include dyslipidemia. The current treatment provides moderate improvement.      Review of Systems  Constitutional: Positive for fatigue. Negative for malaise/fatigue.  Eyes: Negative for blurred vision.  Musculoskeletal: Positive for back pain.  Psychiatric/Behavioral: Positive for depression.  All other systems reviewed and are negative.    Observations/Objective: No SOB or distress noted   Assessment and Plan: 1. Hyperlipidemia associated with type 2  diabetes mellitus ([place]) - CMP14+EGFR; Future - CBC with Differential/Platelet;  Future  2. Diabetic polyneuropathy associated with type 2 diabetes mellitus ([facility]) - CMP14+EGFR; Future - CBC with Differential/Platelet; Future  3. Type 2 diabetes mellitus with other specified complication, without long-term current use of insulin ([facility]) - CMP14+EGFR; Future - CBC with Differential/Platelet; Future - Bayer DCA Hb A1c Waived; Future  4. DDD (degenerative disc disease), lumbar - CMP14+EGFR; Future - CBC with Differential/Platelet; Future  5. Overweight (BMI 25.0-29.9 - CMP14+EGFR; Future - CBC with Differential/Platelet; Future  6. Spinal stenosis of lumbar region with neurogenic claudication - CMP14+EGFR; Future - CBC with Differential/Platelet; Future  7. Depression, recurrent ([place])  - CMP14+EGFR; Future - CBC with Differential/Platelet; Future  Labs pending, come next week Continue medications  Encouraged low carb diet   I discussed the assessment and treatment plan with the patient. The patient was provided an opportunity to ask questions and all were answered. The patient agreed with the plan and demonstrated an understanding of the instructions.   The patient was advised to call back or seek an in-person evaluation if the symptoms worsen or if the condition fails to improve as anticipated.  The above assessment and management plan was discussed with the patient. The patient verbalized understanding of and has agreed to the management plan. Patient is aware to call the clinic if symptoms persist or worsen. Patient is aware when to return to the clinic for a follow-up visit. Patient educated on when it is appropriate to go to the emergency department.   Time call ended:  3:22 pm   I provided 22 minutes of  non face-to-face time during this encounter.    Evelina Dun, FNP

## 2021-01-19 ENCOUNTER — Other Ambulatory Visit: Payer: Self-pay | Admitting: Family

## 2021-01-19 DIAGNOSIS — F339 Major depressive disorder, recurrent, unspecified: Secondary | ICD-10-CM

## 2021-01-19 DIAGNOSIS — M48062 Spinal stenosis, lumbar region with neurogenic claudication: Secondary | ICD-10-CM

## 2021-01-19 DIAGNOSIS — M5137 Other intervertebral disc degeneration, lumbosacral region: Secondary | ICD-10-CM

## 2021-01-19 DIAGNOSIS — M48061 Spinal stenosis, lumbar region without neurogenic claudication: Secondary | ICD-10-CM

## 2021-01-20 ENCOUNTER — Emergency Department (HOSPITAL_COMMUNITY)
Admission: EM | Admit: 2021-01-20 | Discharge: 2021-01-20 | Disposition: A | Discharge status: Home / Self Care | Payer: Commercial Managed Care - PPO | Attending: Emergency Medicine | Admitting: Emergency Medicine

## 2021-01-20 ENCOUNTER — Other Ambulatory Visit: Payer: Self-pay

## 2021-01-20 ENCOUNTER — Emergency Department (HOSPITAL_COMMUNITY): Payer: Commercial Managed Care - PPO

## 2021-01-20 DIAGNOSIS — W1830XA Fall on same level, unspecified, initial encounter: Secondary | ICD-10-CM | POA: Diagnosis not present

## 2021-01-20 DIAGNOSIS — E1142 Type 2 diabetes mellitus with diabetic polyneuropathy: Secondary | ICD-10-CM | POA: Insufficient documentation

## 2021-01-20 DIAGNOSIS — Z79899 Other long term (current) drug therapy: Secondary | ICD-10-CM | POA: Insufficient documentation

## 2021-01-20 DIAGNOSIS — M25512 Pain in left shoulder: Secondary | ICD-10-CM

## 2021-01-20 DIAGNOSIS — I10 Essential (primary) hypertension: Secondary | ICD-10-CM | POA: Insufficient documentation

## 2021-01-20 DIAGNOSIS — Z87891 Personal history of nicotine dependence: Secondary | ICD-10-CM | POA: Diagnosis not present

## 2021-01-20 DIAGNOSIS — Z7984 Long term (current) use of oral hypoglycemic drugs: Secondary | ICD-10-CM | POA: Insufficient documentation

## 2021-01-20 MED ORDER — DICLOFENAC SODIUM 1 % EX GEL: 2.0000 g | Freq: Four times a day (QID) | CUTANEOUS | 0 refills | Status: DC

## 2021-01-20 MED ORDER — METHOCARBAMOL 500 MG PO TABS: 500.0000 mg | ORAL_TABLET | Freq: Two times a day (BID) | ORAL | 0 refills | Status: DC | PRN

## 2021-01-20 MED ORDER — METHOCARBAMOL 500 MG PO TABS
500.0000 mg | ORAL_TABLET | Freq: Once | ORAL | Status: AC
  Administered 2021-01-20: 500 mg via ORAL
  Filled 2021-01-20: qty 1

## 2021-01-20 NOTE — ED Provider Notes (Signed)
[facility]-EMERGENCY DEPT Provider Note   CSN: [id number] Arrival date & time: 01/20/21  9485     History Chief Complaint  Patient presents with  . Shoulder Pain    John Ware is a 55 y.o. male presenting for evaluation of left shoulder pain.  Patient states 2 weeks ago he fell onto his left shoulder, he had a chair.  Pain was gradually improving until yesterday when he had sudden worsening pain of his left shoulder.  He was not doing anything particular at the time.  No new fall or injury.  He does have a history of pinched nerve coming from his neck on that side.  He reports no new numbness or tingling in his hand.  Pain is mostly in the posterior aspect of his shoulder, but also on the lateral side.  He has never had any injuries to the shoulder before.  He is taking his chronic pain medication, has not taken anything else.  Movement makes the pain worse, however he continues to have mild pain at rest.  Nothing makes it better.  Pain does not radiate. Additionally, patient tested positive for COVID last week.  He had fevers the first couple days, they have since resolved.  He has no COVID symptoms currently, and is not in the ER for evaluation of his COVID diagnosis.  HPI     Past Medical History:  Diagnosis Date  . Depression   . Diabetes mellitus without complication ([facility])   . Hypertension   . Spinal stenosis    With Neurogenic Claudication    Patient Active Problem List   Diagnosis Date Noted  . Positive colorectal cancer screening using Cologuard test 12/28/2020  . DOE (dyspnea on exertion) 10/20/2020  . Diabetic polyneuropathy associated with type 2 diabetes mellitus ([facility]) 10/09/2020  . Diabetes mellitus ([facility]) 10/30/2018  . Lumbar stenosis with neurogenic claudication 10/14/2017  . DDD (degenerative disc disease), lumbar 02/17/2017  . Left leg weakness 02/05/2017  . Hyperlipidemia associated with type 2 diabetes mellitus ([facility]) 10/24/2016  .  Depression, recurrent ([facility]) 10/24/2016  . Right knee pain 05/21/2016  . DDD (degenerative disc disease), lumbosacral 05/21/2016  . Overweight (BMI 25.0-29.9) 05/21/2016  . Lumbar stenosis 12/13/2014    Past Surgical History:  Procedure Laterality Date  . CARPAL TUNNEL RELEASE Bilateral   . LUMBAR LAMINECTOMY/DECOMPRESSION MICRODISCECTOMY N/A 12/13/2014   Procedure: LUMBAR THREE-FOUR, LUMBAR FOUR-FIVE, LUMBAR FIVE-SACRAL ONE LAMINECTOMY;  Surgeon: [name], MD;  Location: [facility];  Service: Neurosurgery;  Laterality: N/A;  L3-4 L4-5 L5-S1 Laminectomy  . MENISCUS REPAIR     Right knee  . 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 VASECTOMY         Family History  Problem Relation Age of Onset  . Colon cancer Neg Hx     Social History   Tobacco Use  . Smoking status: Former Smoker    Packs/day: 2.00    Years: 15.00    Pack years: 30.00    Types: Cigarettes    Quit date: 2012    Years since quitting: 10.4  . Smokeless tobacco: Never Used  Vaping Use  . Vaping Use: Never used  Substance Use Topics  . Alcohol use: Yes    Comment: occasional  . Drug use: No    Home Medications Prior to Admission medications   Medication Sig Start Date End Date Taking? Authorizing Provider  diclofenac Sodium (VOLTAREN) 1 % GEL Apply 2 g  topically 4 (four) times daily. 01/20/21  Yes Morrigan Wickens, PA-C  methocarbamol (ROBAXIN) 500 MG tablet Take 1 tablet (500 mg total) by mouth 2 (two) times daily as needed for muscle spasms. 01/20/21  Yes Genisis Sonnier, PA-C  amLODipine (NORVASC) 5 MG tablet Take 1 tablet (5 mg total) by mouth daily. 06/23/20   Jannifer Rodney A, FNP  buPROPion (WELLBUTRIN XL) 150 MG 24 hr tablet TAKE (1) TABLET TWICE A DAY. 01/19/21   Jannifer Rodney A, FNP  busPIRone (BUSPAR) 5 MG tablet TAKE 1 TABLET 3 TIMES A DAY 11/17/20   Hawks, Christy A, FNP  empagliflozin (JARDIANCE) 10 MG TABS tablet Take 1 tablet (10 mg total) by mouth daily before  breakfast. 10/10/20   Jannifer Rodney A, FNP  gabapentin (NEURONTIN) 800 MG tablet TAKE (1) TABLET THREE TIMES DAILY. 01/19/21   Junie Spencer, FNP  metFORMIN (GLUCOPHAGE XR) 750 MG 24 hr tablet Take 2 tablets (1,500 mg total) by mouth daily with breakfast. 06/23/20 12/20/20  Jannifer Rodney A, FNP  oxyCODONE ER (XTAMPZA ER) 18 MG C12A Take 1 tablet by mouth every 12 (twelve) hours. 11/26/19   [provider]  oxyCODONE-acetaminophen (PERCOCET) 7.5-325 MG tablet Take 1 tablet by mouth 3 (three) times daily as needed. 12/25/19   [provider]  ramipril (ALTACE) 10 MG capsule TAKE (1) CAPSULE DAILY 06/23/20   Jannifer Rodney A, FNP  sertraline (ZOLOFT) 100 MG tablet Take 1 tablet (100 mg total) by mouth daily. 06/23/20   Junie Spencer, FNP  atorvastatin (LIPITOR) 20 MG tablet Take 1 tablet (20 mg total) by mouth daily. Patient not taking: Reported on 06/23/2020 02/29/20 08/23/20  Junie Spencer, FNP    Allergies    Tizanidine  Review of Systems   Review of Systems  Musculoskeletal: Positive for arthralgias.  All other systems reviewed and are negative.   Physical Exam Updated Vital Signs BP 122/84 (BP Location: Left Arm)   Pulse (!) 101   Temp 98.4 F (36.9 C) (Oral)   Resp 18   Ht 5\' 10"  (1.778 m)   Wt 93 kg   SpO2 95%   BMI 29.41 kg/m   Physical Exam Vitals and nursing note reviewed.  Constitutional:      General: He is not in acute distress.    Appearance: He is well-developed.     Comments: Resting in the bed in no acute distress  HENT:     Head: Normocephalic and atraumatic.  Eyes:     Conjunctiva/sclera: Conjunctivae normal.     Pupils: Pupils are equal, round, and reactive to light.  Cardiovascular:     Rate and Rhythm: Normal rate and regular rhythm.     Pulses: Normal pulses.  Pulmonary:     Effort: Pulmonary effort is normal. No respiratory distress.     Breath sounds: Normal breath sounds. No wheezing.  Abdominal:     General: There is no  distension.     Palpations: Abdomen is soft. There is no mass.     Tenderness: There is no abdominal tenderness. There is no guarding or rebound.  Musculoskeletal:        General: Tenderness present.     Cervical back: Normal range of motion and neck supple.     Comments: Diffuse tenderness palpation of the left shoulder, mostly along the left trapezius.  He does have tenderness palpation of the glenohumeral joint.  No tenderness palpation along the clavicle or anterior shoulder.  Pain with abduction with both passive  and active range of motion.  Radial pulses 2+ bilaterally.  Grip strength equal bilaterally.  Skin:    General: Skin is warm and dry.     Capillary Refill: Capillary refill takes less than 2 seconds.  Neurological:     Mental Status: He is alert and oriented to person, place, and time.     ED Results / Procedures / Treatments   Labs (all labs ordered are listed, but only abnormal results are displayed) Labs Reviewed - No data to display  EKG None  Radiology DG Shoulder Left  Result Date: 01/20/2021 CLINICAL DATA:  55 year old male with left shoulder pain after fall 2 weeks ago. EXAM: LEFT SHOULDER - 2+ VIEW COMPARISON:  None. FINDINGS: There is no evidence of fracture or dislocation. Mild acromioclavicular joint degenerative changes. Soft tissues are unremarkable. IMPRESSION: No acute fracture or malalignment. Electronically Signed   By: Marliss Coots MD   On: 01/20/2021 09:11    Procedures Procedures   Medications Ordered in ED Medications  methocarbamol (ROBAXIN) tablet 500 mg (has no administration in time range)    ED Course  I have reviewed the triage vital signs and the nursing notes.  Pertinent labs & imaging results that were available during my care of the patient were reviewed by me and considered in my medical decision making (see chart for details).    MDM Rules/Calculators/A&P                          Patient presenting for evaluation of left  shoulder pain.  On exam, patient appears nontoxic.  Pain is reproducible with palpation of the musculature.  While I have low suspicion for bony injury, as patient did have a fall, will obtain x-rays.    X-rays viewed and independently interpreted by me, no fracture or dislocation.  Discussed findings with patient.  He is neurovascularly intact.  Discussed treatment with muscle relaxers, sling, follow-up with orthopedics, resources given.  At this time, patient appears safe for discharge.  Return precautions given.  Patient states he understands and agrees to plan   Final Clinical Impression(s) / ED Diagnoses Final diagnoses:  Acute pain of left shoulder    Rx / DC Orders ED Discharge Orders         Ordered    methocarbamol (ROBAXIN) 500 MG tablet  2 times daily PRN        01/20/21 0916    diclofenac Sodium (VOLTAREN) 1 % GEL  4 times daily        01/20/21 0916           Alveria Apley, PA-C 01/20/21 0932    Pricilla Loveless, MD 01/20/21 1030

## 2021-01-20 NOTE — Progress Notes (Signed)
Orthopedic Tech Progress Note Patient Details:  John Ware 1966-08-09 [id number]  Ortho Devices Type of Ortho Device: Shoulder immobilizer Ortho Device/Splint Location: left Ortho Device/Splint Interventions: Application   Post Interventions Patient Tolerated: Well Instructions Provided: Care of device   Saul Fordyce 01/20/2021, 9:35 AM

## 2021-01-20 NOTE — ED Triage Notes (Signed)
Per patient, he fell 2 weeks ago and hurt is left shoulder. Patient states he thinks he tore his rotator cuff. Patient says pain is 10/10. Patient tested positive for covid Friday at home.

## 2021-01-20 NOTE — Discharge Instructions (Signed)
Continue taking home medications as prescribed. Use Robaxin as needed for stiffness, soreness, or pain.  This may make you tired or groggy. Use Voltaren gel to help with pain control. Use the sling as needed for pain control. Follow-up with orthopedics for further evaluation management. Return to the emergency room with any new, worsening, or concerning symptoms

## 2021-01-24 ENCOUNTER — Ambulatory Visit: Payer: Commercial Managed Care - PPO | Admitting: Family

## 2021-01-25 ENCOUNTER — Telehealth: Payer: Self-pay | Admitting: *Deleted

## 2021-01-25 NOTE — Telephone Encounter (Signed)
LMOVM to call back to schedule TCS with propofol, Dr. Rourk, ASA 3 

## 2021-01-31 ENCOUNTER — Encounter (HOSPITAL_COMMUNITY): Payer: Self-pay | Admitting: *Deleted

## 2021-01-31 ENCOUNTER — Inpatient Hospital Stay (HOSPITAL_COMMUNITY)
Admission: EM | Admit: 2021-01-31 | Discharge: 2021-03-15 | DRG: 853 | Disposition: A | Discharge status: Home Health | Payer: Commercial Managed Care - PPO | Attending: Internal Medicine | Admitting: Internal Medicine

## 2021-01-31 ENCOUNTER — Other Ambulatory Visit: Payer: Self-pay

## 2021-01-31 ENCOUNTER — Emergency Department (HOSPITAL_COMMUNITY): Payer: Commercial Managed Care - PPO

## 2021-01-31 DIAGNOSIS — E875 Hyperkalemia: Secondary | ICD-10-CM

## 2021-01-31 DIAGNOSIS — M6009 Infective myositis, multiple sites: Secondary | ICD-10-CM | POA: Diagnosis present

## 2021-01-31 DIAGNOSIS — R7881 Bacteremia: Secondary | ICD-10-CM

## 2021-01-31 DIAGNOSIS — L02412 Cutaneous abscess of left axilla: Secondary | ICD-10-CM | POA: Diagnosis present

## 2021-01-31 DIAGNOSIS — S299XXA Unspecified injury of thorax, initial encounter: Secondary | ICD-10-CM

## 2021-01-31 DIAGNOSIS — L02213 Cutaneous abscess of chest wall: Secondary | ICD-10-CM

## 2021-01-31 DIAGNOSIS — E1169 Type 2 diabetes mellitus with other specified complication: Secondary | ICD-10-CM | POA: Diagnosis present

## 2021-01-31 DIAGNOSIS — H539 Unspecified visual disturbance: Secondary | ICD-10-CM | POA: Diagnosis not present

## 2021-01-31 DIAGNOSIS — M4626 Osteomyelitis of vertebra, lumbar region: Secondary | ICD-10-CM | POA: Diagnosis present

## 2021-01-31 DIAGNOSIS — Z79899 Other long term (current) drug therapy: Secondary | ICD-10-CM

## 2021-01-31 DIAGNOSIS — A4101 Sepsis due to Methicillin susceptible Staphylococcus aureus: Secondary | ICD-10-CM | POA: Diagnosis present

## 2021-01-31 DIAGNOSIS — M00011 Staphylococcal arthritis, right shoulder: Secondary | ICD-10-CM | POA: Diagnosis present

## 2021-01-31 DIAGNOSIS — Z87891 Personal history of nicotine dependence: Secondary | ICD-10-CM

## 2021-01-31 DIAGNOSIS — D638 Anemia in other chronic diseases classified elsewhere: Secondary | ICD-10-CM | POA: Diagnosis present

## 2021-01-31 DIAGNOSIS — D75839 Thrombocytosis, unspecified: Secondary | ICD-10-CM

## 2021-01-31 DIAGNOSIS — Z7984 Long term (current) use of oral hypoglycemic drugs: Secondary | ICD-10-CM

## 2021-01-31 DIAGNOSIS — M5137 Other intervertebral disc degeneration, lumbosacral region: Secondary | ICD-10-CM

## 2021-01-31 DIAGNOSIS — L0291 Cutaneous abscess, unspecified: Secondary | ICD-10-CM

## 2021-01-31 DIAGNOSIS — M48062 Spinal stenosis, lumbar region with neurogenic claudication: Secondary | ICD-10-CM

## 2021-01-31 DIAGNOSIS — E871 Hypo-osmolality and hyponatremia: Secondary | ICD-10-CM | POA: Diagnosis not present

## 2021-01-31 DIAGNOSIS — M25512 Pain in left shoulder: Secondary | ICD-10-CM | POA: Diagnosis not present

## 2021-01-31 DIAGNOSIS — E1151 Type 2 diabetes mellitus with diabetic peripheral angiopathy without gangrene: Secondary | ICD-10-CM | POA: Diagnosis present

## 2021-01-31 DIAGNOSIS — R52 Pain, unspecified: Secondary | ICD-10-CM

## 2021-01-31 DIAGNOSIS — E669 Obesity, unspecified: Secondary | ICD-10-CM | POA: Diagnosis present

## 2021-01-31 DIAGNOSIS — I2690 Septic pulmonary embolism without acute cor pulmonale: Secondary | ICD-10-CM | POA: Diagnosis present

## 2021-01-31 DIAGNOSIS — M25561 Pain in right knee: Secondary | ICD-10-CM | POA: Diagnosis present

## 2021-01-31 DIAGNOSIS — M51379 Other intervertebral disc degeneration, lumbosacral region without mention of lumbar back pain or lower extremity pain: Secondary | ICD-10-CM

## 2021-01-31 DIAGNOSIS — D62 Acute posthemorrhagic anemia: Secondary | ICD-10-CM | POA: Diagnosis not present

## 2021-01-31 DIAGNOSIS — L89322 Pressure ulcer of left buttock, stage 2: Secondary | ICD-10-CM | POA: Diagnosis present

## 2021-01-31 DIAGNOSIS — E1142 Type 2 diabetes mellitus with diabetic polyneuropathy: Secondary | ICD-10-CM | POA: Diagnosis present

## 2021-01-31 DIAGNOSIS — F419 Anxiety disorder, unspecified: Secondary | ICD-10-CM

## 2021-01-31 DIAGNOSIS — D649 Anemia, unspecified: Secondary | ICD-10-CM | POA: Diagnosis not present

## 2021-01-31 DIAGNOSIS — W19XXXA Unspecified fall, initial encounter: Secondary | ICD-10-CM

## 2021-01-31 DIAGNOSIS — I330 Acute and subacute infective endocarditis: Secondary | ICD-10-CM | POA: Diagnosis present

## 2021-01-31 DIAGNOSIS — K6812 Psoas muscle abscess: Secondary | ICD-10-CM

## 2021-01-31 DIAGNOSIS — M25511 Pain in right shoulder: Secondary | ICD-10-CM | POA: Diagnosis not present

## 2021-01-31 DIAGNOSIS — A419 Sepsis, unspecified organism: Secondary | ICD-10-CM | POA: Diagnosis not present

## 2021-01-31 DIAGNOSIS — L02414 Cutaneous abscess of left upper limb: Secondary | ICD-10-CM | POA: Diagnosis present

## 2021-01-31 DIAGNOSIS — I77810 Thoracic aortic ectasia: Secondary | ICD-10-CM | POA: Diagnosis present

## 2021-01-31 DIAGNOSIS — Z09 Encounter for follow-up examination after completed treatment for conditions other than malignant neoplasm: Secondary | ICD-10-CM

## 2021-01-31 DIAGNOSIS — Z1623 Resistance to quinolones and fluoroquinolones: Secondary | ICD-10-CM | POA: Diagnosis present

## 2021-01-31 DIAGNOSIS — Z8616 Personal history of COVID-19: Secondary | ICD-10-CM | POA: Diagnosis not present

## 2021-01-31 DIAGNOSIS — M48061 Spinal stenosis, lumbar region without neurogenic claudication: Secondary | ICD-10-CM

## 2021-01-31 DIAGNOSIS — G8929 Other chronic pain: Secondary | ICD-10-CM | POA: Diagnosis present

## 2021-01-31 DIAGNOSIS — E11649 Type 2 diabetes mellitus with hypoglycemia without coma: Secondary | ICD-10-CM | POA: Diagnosis present

## 2021-01-31 DIAGNOSIS — M4624 Osteomyelitis of vertebra, thoracic region: Secondary | ICD-10-CM | POA: Diagnosis present

## 2021-01-31 DIAGNOSIS — Z981 Arthrodesis status: Secondary | ICD-10-CM

## 2021-01-31 DIAGNOSIS — M272 Inflammatory conditions of jaws: Secondary | ICD-10-CM | POA: Diagnosis present

## 2021-01-31 DIAGNOSIS — Z6830 Body mass index (BMI) 30.0-30.9, adult: Secondary | ICD-10-CM

## 2021-01-31 DIAGNOSIS — I10 Essential (primary) hypertension: Secondary | ICD-10-CM

## 2021-01-31 DIAGNOSIS — B9561 Methicillin susceptible Staphylococcus aureus infection as the cause of diseases classified elsewhere: Secondary | ICD-10-CM | POA: Diagnosis not present

## 2021-01-31 DIAGNOSIS — E785 Hyperlipidemia, unspecified: Secondary | ICD-10-CM | POA: Diagnosis present

## 2021-01-31 DIAGNOSIS — E119 Type 2 diabetes mellitus without complications: Secondary | ICD-10-CM

## 2021-01-31 DIAGNOSIS — E1110 Type 2 diabetes mellitus with ketoacidosis without coma: Secondary | ICD-10-CM | POA: Diagnosis present

## 2021-01-31 DIAGNOSIS — N390 Urinary tract infection, site not specified: Secondary | ICD-10-CM

## 2021-01-31 DIAGNOSIS — K6819 Other retroperitoneal abscess: Secondary | ICD-10-CM | POA: Diagnosis present

## 2021-01-31 DIAGNOSIS — F339 Major depressive disorder, recurrent, unspecified: Secondary | ICD-10-CM

## 2021-01-31 DIAGNOSIS — R0989 Other specified symptoms and signs involving the circulatory and respiratory systems: Secondary | ICD-10-CM

## 2021-01-31 DIAGNOSIS — E1310 Other specified diabetes mellitus with ketoacidosis without coma: Secondary | ICD-10-CM

## 2021-01-31 DIAGNOSIS — R6520 Severe sepsis without septic shock: Secondary | ICD-10-CM | POA: Insufficient documentation

## 2021-01-31 DIAGNOSIS — M00012 Staphylococcal arthritis, left shoulder: Secondary | ICD-10-CM | POA: Diagnosis present

## 2021-01-31 DIAGNOSIS — I38 Endocarditis, valve unspecified: Secondary | ICD-10-CM

## 2021-01-31 DIAGNOSIS — L8990 Pressure ulcer of unspecified site, unspecified stage: Secondary | ICD-10-CM | POA: Insufficient documentation

## 2021-01-31 DIAGNOSIS — H43393 Other vitreous opacities, bilateral: Secondary | ICD-10-CM | POA: Diagnosis present

## 2021-01-31 LAB — URINALYSIS, ROUTINE W REFLEX MICROSCOPIC
Bilirubin Urine: NEGATIVE
Glucose, UA: >=500 mg/dL — AB
Ketones, ur: 80 mg/dL — AB
Leukocytes,Ua: NEGATIVE
Nitrite: POSITIVE — AB
Protein, ur: NEGATIVE mg/dL
Specific Gravity, Urine: 1.014 (ref 1.005–1.030)
pH: 5 (ref 5.0–8.0)

## 2021-01-31 LAB — COMPREHENSIVE METABOLIC PANEL
ALT: 53 U/L — ABNORMAL HIGH (ref 0–44)
AST: 70 U/L — ABNORMAL HIGH (ref 15–41)
Albumin: 2.4 g/dL — ABNORMAL LOW (ref 3.5–5.0)
Alkaline Phosphatase: 88 U/L (ref 38–126)
Anion gap: 18 — ABNORMAL HIGH (ref 5–15)
BUN: 25 mg/dL — ABNORMAL HIGH (ref 6–20)
CO2: 20 mmol/L — ABNORMAL LOW (ref 22–32)
Calcium: 7.7 mg/dL — ABNORMAL LOW (ref 8.9–10.3)
Chloride: 75 mmol/L — ABNORMAL LOW (ref 98–111)
Creatinine, Ser: 0.9 mg/dL (ref 0.61–1.24)
GFR, Estimated: >60 mL/min (ref >60)
Glucose, Bld: 339 mg/dL — ABNORMAL HIGH (ref 70–99)
Potassium: 3.9 mmol/L (ref 3.5–5.1)
Sodium: 113 mmol/L — CL (ref 135–145)
Total Bilirubin: 1.5 mg/dL — ABNORMAL HIGH (ref 0.3–1.2)
Total Protein: 6.2 g/dL — ABNORMAL LOW (ref 6.5–8.1)

## 2021-01-31 LAB — RENAL FUNCTION PANEL
Albumin: 2 g/dL — ABNORMAL LOW (ref 3.5–5.0)
Albumin: 2.1 g/dL — ABNORMAL LOW (ref 3.5–5.0)
Anion gap: 17 — ABNORMAL HIGH (ref 5–15)
Anion gap: 18 — ABNORMAL HIGH (ref 5–15)
BUN: 20 mg/dL (ref 6–20)
BUN: 20 mg/dL (ref 6–20)
CO2: 18 mmol/L — ABNORMAL LOW (ref 22–32)
CO2: 18 mmol/L — ABNORMAL LOW (ref 22–32)
Calcium: 7.5 mg/dL — ABNORMAL LOW (ref 8.9–10.3)
Calcium: 7.4 mg/dL — ABNORMAL LOW (ref 8.9–10.3)
Chloride: 84 mmol/L — ABNORMAL LOW (ref 98–111)
Chloride: 81 mmol/L — ABNORMAL LOW (ref 98–111)
Creatinine, Ser: 0.75 mg/dL (ref 0.61–1.24)
Creatinine, Ser: 0.69 mg/dL (ref 0.61–1.24)
GFR, Estimated: >60 mL/min (ref >60)
GFR, Estimated: >60 mL/min (ref >60)
Glucose, Bld: 300 mg/dL — ABNORMAL HIGH (ref 70–99)
Glucose, Bld: 294 mg/dL — ABNORMAL HIGH (ref 70–99)
Phosphorus: 2.8 mg/dL (ref 2.5–4.6)
Phosphorus: 2.8 mg/dL (ref 2.5–4.6)
Potassium: 3.9 mmol/L (ref 3.5–5.1)
Potassium: 3.7 mmol/L (ref 3.5–5.1)
Sodium: 119 mmol/L — CL (ref 135–145)
Sodium: 117 mmol/L — CL (ref 135–145)

## 2021-01-31 LAB — CK: Total CK: 263 U/L (ref 49–397)

## 2021-01-31 LAB — CBC
HCT: 37.8 % — ABNORMAL LOW (ref 39.0–52.0)
Hemoglobin: 13.7 g/dL (ref 13.0–17.0)
MCH: 30.8 pg (ref 26.0–34.0)
MCHC: 36.2 g/dL — ABNORMAL HIGH (ref 30.0–36.0)
MCV: 84.9 fL (ref 80.0–100.0)
Platelets: 472 10*3/uL — ABNORMAL HIGH (ref 150–400)
RBC: 4.45 MIL/uL (ref 4.22–5.81)
RDW: 12.9 % (ref 11.5–15.5)
WBC: 23.1 10*3/uL — ABNORMAL HIGH (ref 4.0–10.5)
nRBC: 0 % (ref 0.0–0.2)

## 2021-01-31 LAB — RAPID URINE DRUG SCREEN, HOSP PERFORMED
Amphetamines: NOT DETECTED
Barbiturates: NOT DETECTED
Benzodiazepines: NOT DETECTED
Cocaine: NOT DETECTED
Opiates: NOT DETECTED
Tetrahydrocannabinol: POSITIVE — AB

## 2021-01-31 LAB — GLUCOSE, CAPILLARY
Glucose-Capillary: 299 mg/dL — ABNORMAL HIGH (ref 70–99)
Glucose-Capillary: 325 mg/dL — ABNORMAL HIGH (ref 70–99)

## 2021-01-31 LAB — CBC WITH DIFFERENTIAL/PLATELET
Abs Immature Granulocytes: 0.4 10*3/uL — ABNORMAL HIGH (ref 0.00–0.07)
Basophils Absolute: 0 10*3/uL (ref 0.0–0.1)
Basophils Relative: 0 %
Eosinophils Absolute: 0 10*3/uL (ref 0.0–0.5)
Eosinophils Relative: 0 %
HCT: 40 % (ref 39.0–52.0)
Hemoglobin: 14.5 g/dL (ref 13.0–17.0)
Immature Granulocytes: 2 %
Lymphocytes Relative: 3 %
Lymphs Abs: 0.5 10*3/uL — ABNORMAL LOW (ref 0.7–4.0)
MCH: 30.6 pg (ref 26.0–34.0)
MCHC: 36.3 g/dL — ABNORMAL HIGH (ref 30.0–36.0)
MCV: 84.4 fL (ref 80.0–100.0)
Monocytes Absolute: 0.6 10*3/uL (ref 0.1–1.0)
Monocytes Relative: 3 %
Neutro Abs: 18.9 10*3/uL — ABNORMAL HIGH (ref 1.7–7.7)
Neutrophils Relative %: 92 %
Platelets: 438 10*3/uL — ABNORMAL HIGH (ref 150–400)
RBC: 4.74 MIL/uL (ref 4.22–5.81)
RDW: 12.8 % (ref 11.5–15.5)
WBC: 20.5 10*3/uL — ABNORMAL HIGH (ref 4.0–10.5)
nRBC: 0 % (ref 0.0–0.2)

## 2021-01-31 LAB — TROPONIN I (HIGH SENSITIVITY)
Troponin I (High Sensitivity): 9 ng/L (ref <18)
Troponin I (High Sensitivity): 8 ng/L (ref <18)

## 2021-01-31 LAB — CREATININE, SERUM
Creatinine, Ser: 0.74 mg/dL (ref 0.61–1.24)
GFR, Estimated: >60 mL/min (ref >60)

## 2021-01-31 LAB — MRSA PCR SCREENING: MRSA by PCR: NEGATIVE

## 2021-01-31 LAB — OSMOLALITY, URINE: Osmolality, Ur: 444 mOsm/kg (ref 300–900)

## 2021-01-31 LAB — APTT: aPTT: 24 seconds (ref 24–36)

## 2021-01-31 LAB — PROTIME-INR
INR: 1.4 — ABNORMAL HIGH (ref 0.8–1.2)
Prothrombin Time: 16.8 seconds — ABNORMAL HIGH (ref 11.4–15.2)

## 2021-01-31 LAB — CBG MONITORING, ED: Glucose-Capillary: 378 mg/dL — ABNORMAL HIGH (ref 70–99)

## 2021-01-31 LAB — SODIUM, URINE, RANDOM: Sodium, Ur: <10 mmol/L

## 2021-01-31 LAB — LACTIC ACID, PLASMA
Lactic Acid, Venous: 3.8 mmol/L (ref 0.5–1.9)
Lactic Acid, Venous: 3.2 mmol/L (ref 0.5–1.9)
Lactic Acid, Venous: 2.8 mmol/L (ref 0.5–1.9)
Lactic Acid, Venous: 2.9 mmol/L (ref 0.5–1.9)

## 2021-01-31 LAB — OSMOLALITY: Osmolality: 261 mOsm/kg — ABNORMAL LOW (ref 275–295)

## 2021-01-31 LAB — ETHANOL: Alcohol, Ethyl (B): <10 mg/dL (ref <10)

## 2021-01-31 LAB — RESP PANEL BY RT-PCR (FLU A&B, COVID) ARPGX2
Influenza A by PCR: NEGATIVE
Influenza B by PCR: NEGATIVE
SARS Coronavirus 2 by RT PCR: POSITIVE — AB

## 2021-01-31 LAB — BETA-HYDROXYBUTYRIC ACID: Beta-Hydroxybutyric Acid: 3.24 mmol/L — ABNORMAL HIGH (ref 0.05–0.27)

## 2021-01-31 LAB — PROCALCITONIN: Procalcitonin: 6.28 ng/mL

## 2021-01-31 LAB — MAGNESIUM: Magnesium: 2.2 mg/dL (ref 1.7–2.4)

## 2021-01-31 MED ORDER — VANCOMYCIN HCL IN DEXTROSE 1-5 GM/200ML-% IV SOLN
1000.0000 mg | Freq: Once | INTRAVENOUS | Status: DC
  Filled 2021-01-31: qty 200

## 2021-01-31 MED ORDER — CHLORHEXIDINE GLUCONATE CLOTH 2 % EX PADS
6.0000 | MEDICATED_PAD | Freq: Every day | CUTANEOUS | Status: DC
  Administered 2021-01-31 – 2021-02-18 (×18): 6 via TOPICAL

## 2021-01-31 MED ORDER — METRONIDAZOLE 500 MG/100ML IV SOLN
500.0000 mg | Freq: Once | INTRAVENOUS | Status: AC
  Administered 2021-01-31: 500 mg via INTRAVENOUS
  Filled 2021-01-31: qty 100

## 2021-01-31 MED ORDER — INSULIN ASPART 100 UNIT/ML IJ SOLN
0.0000 [IU] | Freq: Every day | INTRAMUSCULAR | Status: DC
Start: 2021-01-31 — End: 2021-03-15
  Administered 2021-01-31: 4 [IU] via SUBCUTANEOUS
  Administered 2021-02-04 – 2021-02-07 (×2): 2 [IU] via SUBCUTANEOUS
  Administered 2021-02-10: 3 [IU] via SUBCUTANEOUS
  Administered 2021-02-12 – 2021-02-17 (×2): 2 [IU] via SUBCUTANEOUS
  Administered 2021-03-06: 3 [IU] via SUBCUTANEOUS
  Administered 2021-03-07: 2 [IU] via SUBCUTANEOUS
  Administered 2021-03-08 – 2021-03-12 (×2): 5 [IU] via SUBCUTANEOUS
  Filled 2021-01-31: qty 0.05

## 2021-01-31 MED ORDER — ONDANSETRON HCL 4 MG/2ML IJ SOLN
4.0000 mg | INTRAMUSCULAR | Status: DC | PRN
  Administered 2021-02-26: 4 mg via INTRAVENOUS
  Filled 2021-01-31: qty 2

## 2021-01-31 MED ORDER — SODIUM CHLORIDE 0.9 % IV SOLN
2.0000 g | Freq: Once | INTRAVENOUS | Status: AC
  Administered 2021-01-31: 2 g via INTRAVENOUS
  Filled 2021-01-31: qty 2

## 2021-01-31 MED ORDER — INSULIN GLARGINE 100 UNIT/ML ~~LOC~~ SOLN
10.0000 [IU] | Freq: Every day | SUBCUTANEOUS | Status: DC
  Administered 2021-01-31 – 2021-02-06 (×7): 10 [IU] via SUBCUTANEOUS
  Filled 2021-01-31 (×8): qty 0.1

## 2021-01-31 MED ORDER — ENOXAPARIN SODIUM 40 MG/0.4ML IJ SOSY
40.0000 mg | PREFILLED_SYRINGE | INTRAMUSCULAR | Status: DC
  Administered 2021-01-31 – 2021-02-07 (×8): 40 mg via SUBCUTANEOUS
  Filled 2021-01-31 (×8): qty 0.4

## 2021-01-31 MED ORDER — SODIUM CHLORIDE 0.9 % IV SOLN
1.0000 g | INTRAVENOUS | Status: DC
  Administered 2021-01-31: 1 g via INTRAVENOUS
  Filled 2021-01-31: qty 1
  Filled 2021-01-31: qty 10

## 2021-01-31 MED ORDER — OXYCODONE ER 18 MG PO C12A: 1.0000 | EXTENDED_RELEASE_CAPSULE | Freq: Two times a day (BID) | ORAL | Status: DC

## 2021-01-31 MED ORDER — VANCOMYCIN HCL 2000 MG/400ML IV SOLN
2000.0000 mg | Freq: Once | INTRAVENOUS | Status: AC
  Administered 2021-01-31: 2000 mg via INTRAVENOUS
  Filled 2021-01-31: qty 400

## 2021-01-31 MED ORDER — OXYCODONE HCL 5 MG PO TABS
5.0000 mg | ORAL_TABLET | ORAL | Status: DC | PRN
  Administered 2021-02-01 – 2021-02-23 (×43): 5 mg via ORAL
  Filled 2021-01-31 (×43): qty 1

## 2021-01-31 MED ORDER — SODIUM CHLORIDE 0.9 % IV BOLUS
1000.0000 mL | Freq: Once | INTRAVENOUS | Status: AC
  Administered 2021-01-31: 1000 mL via INTRAVENOUS

## 2021-01-31 MED ORDER — ACETAMINOPHEN 325 MG PO TABS
650.0000 mg | ORAL_TABLET | ORAL | Status: DC | PRN
  Administered 2021-01-31 – 2021-02-01 (×3): 650 mg via ORAL
  Filled 2021-01-31 (×3): qty 2

## 2021-01-31 MED ORDER — INSULIN ASPART 100 UNIT/ML IJ SOLN
0.0000 [IU] | Freq: Three times a day (TID) | INTRAMUSCULAR | Status: DC
  Administered 2021-01-31: 7 [IU] via SUBCUTANEOUS
  Administered 2021-02-01: 5 [IU] via SUBCUTANEOUS
  Administered 2021-02-01: 3 [IU] via SUBCUTANEOUS
  Administered 2021-02-01: 8 [IU] via SUBCUTANEOUS
  Administered 2021-02-02 (×2): 3 [IU] via SUBCUTANEOUS
  Administered 2021-02-02: 5 [IU] via SUBCUTANEOUS
  Administered 2021-02-03 (×3): 8 [IU] via SUBCUTANEOUS
  Administered 2021-02-04: 3 [IU] via SUBCUTANEOUS
  Administered 2021-02-04: 5 [IU] via SUBCUTANEOUS
  Administered 2021-02-04 – 2021-02-05 (×3): 3 [IU] via SUBCUTANEOUS
  Administered 2021-02-05: 5 [IU] via SUBCUTANEOUS
  Administered 2021-02-06: 8 [IU] via SUBCUTANEOUS
  Administered 2021-02-06: 3 [IU] via SUBCUTANEOUS
  Administered 2021-02-06: 8 [IU] via SUBCUTANEOUS
  Administered 2021-02-07: 2 [IU] via SUBCUTANEOUS
  Administered 2021-02-07 (×2): 5 [IU] via SUBCUTANEOUS
  Administered 2021-02-08 (×2): 2 [IU] via SUBCUTANEOUS
  Administered 2021-02-09: 8 [IU] via SUBCUTANEOUS
  Administered 2021-02-09: 11 [IU] via SUBCUTANEOUS
  Administered 2021-02-09: 15 [IU] via SUBCUTANEOUS
  Administered 2021-02-10: 8 [IU] via SUBCUTANEOUS
  Administered 2021-02-11: 2 [IU] via SUBCUTANEOUS
  Administered 2021-02-11: 5 [IU] via SUBCUTANEOUS
  Administered 2021-02-12: 3 [IU] via SUBCUTANEOUS
  Administered 2021-02-12: 5 [IU] via SUBCUTANEOUS
  Administered 2021-02-12: 2 [IU] via SUBCUTANEOUS
  Administered 2021-02-13: 3 [IU] via SUBCUTANEOUS
  Administered 2021-02-13 – 2021-02-14 (×2): 5 [IU] via SUBCUTANEOUS
  Administered 2021-02-14: 2 [IU] via SUBCUTANEOUS
  Administered 2021-02-14: 3 [IU] via SUBCUTANEOUS
  Administered 2021-02-15 – 2021-02-16 (×2): 2 [IU] via SUBCUTANEOUS
  Administered 2021-02-16: 3 [IU] via SUBCUTANEOUS
  Administered 2021-02-16: 2 [IU] via SUBCUTANEOUS
  Administered 2021-02-17: 3 [IU] via SUBCUTANEOUS
  Administered 2021-02-18: 2 [IU] via SUBCUTANEOUS
  Administered 2021-02-19 – 2021-02-21 (×2): 3 [IU] via SUBCUTANEOUS
  Administered 2021-02-21 (×2): 11 [IU] via SUBCUTANEOUS
  Administered 2021-02-22 – 2021-02-23 (×2): 2 [IU] via SUBCUTANEOUS
  Administered 2021-02-23: 3 [IU] via SUBCUTANEOUS
  Administered 2021-02-24: 2 [IU] via SUBCUTANEOUS
  Administered 2021-02-24 – 2021-02-25 (×2): 3 [IU] via SUBCUTANEOUS
  Administered 2021-02-26: 5 [IU] via SUBCUTANEOUS
  Administered 2021-02-27: 8 [IU] via SUBCUTANEOUS
  Administered 2021-02-27: 2 [IU] via SUBCUTANEOUS
  Administered 2021-03-01 (×2): 3 [IU] via SUBCUTANEOUS
  Administered 2021-03-02 – 2021-03-04 (×3): 2 [IU] via SUBCUTANEOUS
  Administered 2021-03-05: 3 [IU] via SUBCUTANEOUS
  Administered 2021-03-06: 5 [IU] via SUBCUTANEOUS
  Administered 2021-03-07: 2 [IU] via SUBCUTANEOUS
  Administered 2021-03-07: 3 [IU] via SUBCUTANEOUS
  Administered 2021-03-07: 5 [IU] via SUBCUTANEOUS
  Administered 2021-03-09: 8 [IU] via SUBCUTANEOUS
  Administered 2021-03-09: 5 [IU] via SUBCUTANEOUS
  Administered 2021-03-10 – 2021-03-12 (×6): 2 [IU] via SUBCUTANEOUS
  Administered 2021-03-13: 8 [IU] via SUBCUTANEOUS
  Administered 2021-03-13: 2 [IU] via SUBCUTANEOUS
  Administered 2021-03-13: 5 [IU] via SUBCUTANEOUS
  Administered 2021-03-14 (×2): 2 [IU] via SUBCUTANEOUS
  Administered 2021-03-15: 5 [IU] via SUBCUTANEOUS
  Filled 2021-01-31: qty 0.15

## 2021-01-31 MED ORDER — DOCUSATE SODIUM 100 MG PO CAPS
100.0000 mg | ORAL_CAPSULE | Freq: Two times a day (BID) | ORAL | Status: DC | PRN
  Administered 2021-02-04: 100 mg via ORAL
  Filled 2021-01-31: qty 1

## 2021-01-31 MED ORDER — GABAPENTIN 400 MG PO CAPS
800.0000 mg | ORAL_CAPSULE | Freq: Three times a day (TID) | ORAL | Status: DC
  Administered 2021-01-31 – 2021-02-18 (×53): 800 mg via ORAL
  Filled 2021-01-31 (×55): qty 2

## 2021-01-31 MED ORDER — SODIUM CHLORIDE 0.9 % IV SOLN: INTRAVENOUS | Status: DC

## 2021-01-31 MED ORDER — ALUM & MAG HYDROXIDE-SIMETH 200-200-20 MG/5ML PO SUSP: 30.0000 mL | ORAL | Status: DC | PRN

## 2021-01-31 MED ORDER — OXYCODONE HCL ER 20 MG PO T12A
20.0000 mg | EXTENDED_RELEASE_TABLET | Freq: Two times a day (BID) | ORAL | Status: DC
Start: 2021-01-31 — End: 2021-02-16
  Administered 2021-01-31 – 2021-02-15 (×29): 20 mg via ORAL
  Filled 2021-01-31 (×30): qty 1

## 2021-01-31 MED ORDER — SODIUM CHLORIDE 0.9 % IV BOLUS (SEPSIS)
1000.0000 mL | Freq: Once | INTRAVENOUS | Status: AC
  Administered 2021-01-31: 1000 mL via INTRAVENOUS

## 2021-01-31 MED ORDER — ORAL CARE MOUTH RINSE
15.0000 mL | Freq: Two times a day (BID) | OROMUCOSAL | Status: DC
  Administered 2021-01-31 – 2021-03-15 (×80): 15 mL via OROMUCOSAL

## 2021-01-31 MED ORDER — METHOCARBAMOL 500 MG PO TABS
500.0000 mg | ORAL_TABLET | Freq: Three times a day (TID) | ORAL | Status: DC | PRN
  Administered 2021-01-31 – 2021-03-15 (×20): 500 mg via ORAL
  Filled 2021-01-31 (×23): qty 1

## 2021-01-31 MED ORDER — TRAZODONE HCL 50 MG PO TABS
25.0000 mg | ORAL_TABLET | Freq: Every evening | ORAL | Status: DC | PRN
  Administered 2021-02-02 – 2021-03-14 (×15): 25 mg via ORAL
  Filled 2021-01-31 (×16): qty 1

## 2021-01-31 MED ORDER — METOPROLOL TARTRATE 5 MG/5ML IV SOLN
5.0000 mg | Freq: Once | INTRAVENOUS | Status: AC
  Administered 2021-01-31: 5 mg via INTRAVENOUS
  Filled 2021-01-31: qty 5

## 2021-01-31 MED ORDER — LACTATED RINGERS IV BOLUS
500.0000 mL | Freq: Once | INTRAVENOUS | Status: AC
  Administered 2021-01-31: 500 mL via INTRAVENOUS

## 2021-01-31 NOTE — Progress Notes (Addendum)
EKG obtained for previous note of pts HR in 140s-150s. In pts chart. MD at bedside. Orders in place and being administered. HR rechecked and is back in 108-117s. Pt asymptomatic. Pt resumed eating dinner.  Orders for NS at 100 per MD Ronaldo Miyamoto for critical sodium of 117. Will monitor.

## 2021-01-31 NOTE — Progress Notes (Signed)
A consult was received from an ED provider for Vancomycin and Cefepime per pharmacy dosing.  The patient's profile has been reviewed for ht/wt/allergies/indication/available labs.    A one time order has been placed for Vancomycin 2g IV and Cefepime 2g IV.  Further antibiotics/pharmacy consults should be ordered by admitting physician if indicated.                       Thank you, Jamse Mead 01/31/2021  11:06 AM

## 2021-01-31 NOTE — ED Provider Notes (Signed)
[facility]-EMERGENCY DEPT Provider Note   CSN: [id number] Arrival date & time: 01/31/21  8546     History Chief Complaint  Patient presents with  . Generalized Body Aches    John Ware is a 55 y.o. male.  55 year old male with history of diabetes, spinal stenosis, hyperlipidemia, presents with complaint of feeling unwell with pain from the waist up for the past 2-3 weeks. States for the past few days he has been having difficulty walking due to weakness, did have a fall about 2 weeks ago injuring his left shoulder. Reports loss of appetite, not eating, has not been monitoring his blood sugar.  States his nephew had to help him get into the car today and it took several nurses to help get him out of the car as he is generally weak and unable to walk on his own.  States COVID positive on home test last month.        Past Medical History:  Diagnosis Date  . Depression   . Diabetes mellitus without complication (HCC)   . Hypertension   . Spinal stenosis    With Neurogenic Claudication    Patient Active Problem List   Diagnosis Date Noted  . Hyponatremia 01/31/2021  . Positive colorectal cancer screening using Cologuard test 12/28/2020  . DOE (dyspnea on exertion) 10/20/2020  . Diabetic polyneuropathy associated with type 2 diabetes mellitus (HCC) 10/09/2020  . Diabetes mellitus (HCC) 10/30/2018  . Lumbar stenosis with neurogenic claudication 10/14/2017  . DDD (degenerative disc disease), lumbar 02/17/2017  . Left leg weakness 02/05/2017  . Hyperlipidemia associated with type 2 diabetes mellitus (HCC) 10/24/2016  . Depression, recurrent (HCC) 10/24/2016  . Right knee pain 05/21/2016  . DDD (degenerative disc disease), lumbosacral 05/21/2016  . Overweight (BMI 25.0-29.9) 05/21/2016  . Lumbar stenosis 12/13/2014    Past Surgical History:  Procedure Laterality Date  . CARPAL TUNNEL RELEASE Bilateral   . LUMBAR LAMINECTOMY/DECOMPRESSION  MICRODISCECTOMY N/A 12/13/2014   Procedure: LUMBAR THREE-FOUR, LUMBAR FOUR-FIVE, LUMBAR FIVE-SACRAL ONE LAMINECTOMY;  Surgeon: [name], MD;  Location: [facility];  Service: Neurosurgery;  Laterality: N/A;  L3-4 L4-5 L5-S1 Laminectomy  . MENISCUS REPAIR     Right knee  . 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 VASECTOMY         Family History  Problem Relation Age of Onset  . Colon cancer Neg Hx     Social History   Tobacco Use  . Smoking status: Former Smoker    Packs/day: 2.00    Years: 15.00    Pack years: 30.00    Types: Cigarettes    Quit date: 2012    Years since quitting: 10.4  . Smokeless tobacco: Never Used  Vaping Use  . Vaping Use: Never used  Substance Use Topics  . Alcohol use: Yes    Comment: occasional  . Drug use: No    Home Medications Prior to Admission medications   Medication Sig Start Date End Date Taking? Authorizing Provider  acetaminophen (TYLENOL) 325 MG tablet Take 650-975 mg by mouth every 6 (six) hours as needed for mild pain, fever or headache.   Yes [provider]  amLODipine (NORVASC) 5 MG tablet Take 1 tablet (5 mg total) by mouth daily. 06/23/20  Yes Hawks, Christy A, FNP  buPROPion (WELLBUTRIN XL) 150 MG 24 hr tablet TAKE (1) TABLET TWICE A DAY. Patient taking differently: Take 150 mg by mouth 2 (two)  times daily. 01/19/21  Yes Hawks, Christy A, FNP  busPIRone (BUSPAR) 5 MG tablet TAKE 1 TABLET 3 TIMES A DAY Patient taking differently: Take 5 mg by mouth 3 (three) times daily. 11/17/20  Yes Hawks, Christy A, FNP  gabapentin (NEURONTIN) 800 MG tablet TAKE (1) TABLET THREE TIMES DAILY. Patient taking differently: Take 800 mg by mouth 3 (three) times daily. 01/19/21  Yes Jannifer RodneyHawks, Christy A, FNP  metFORMIN (GLUCOPHAGE XR) 750 MG 24 hr tablet Take 2 tablets (1,500 mg total) by mouth daily with breakfast. 06/23/20 12/20/20 Yes Hawks, Christy A, FNP  methocarbamol (ROBAXIN) 500 MG tablet Take 1 tablet (500 mg total) by  mouth 2 (two) times daily as needed for muscle spasms. 01/20/21  Yes Caccavale, Sophia, PA-C  oxyCODONE ER (XTAMPZA ER) 18 MG C12A Take 1 tablet by mouth every 12 (twelve) hours. 11/26/19  Yes [provider]  oxyCODONE-acetaminophen (PERCOCET) 10-325 MG tablet Take 1 tablet by mouth 3 (three) times daily as needed for pain. 01/25/21  Yes [provider]  ramipril (ALTACE) 10 MG capsule TAKE (1) CAPSULE DAILY Patient taking differently: Take 10 mg by mouth daily. 06/23/20  Yes Hawks, Christy A, FNP  sertraline (ZOLOFT) 100 MG tablet Take 1 tablet (100 mg total) by mouth daily. 06/23/20  Yes Hawks, Christy A, FNP  Vitamin D, Ergocalciferol, (DRISDOL) 1.25 MG (50000 UNIT) CAPS capsule Take 50,000 Units by mouth once a week. 01/25/21  Yes [provider]  diclofenac Sodium (VOLTAREN) 1 % GEL Apply 2 g topically 4 (four) times daily. Patient not taking: Reported on 01/31/2021 01/20/21   Caccavale, Sophia, PA-C  empagliflozin (JARDIANCE) 10 MG TABS tablet Take 1 tablet (10 mg total) by mouth daily before breakfast. Patient not taking: Reported on 01/31/2021 10/10/20   Junie SpencerHawks, Christy A, FNP  atorvastatin (LIPITOR) 20 MG tablet Take 1 tablet (20 mg total) by mouth daily. Patient not taking: Reported on 06/23/2020 02/29/20 08/23/20  Junie SpencerHawks, Christy A, FNP    Allergies    Tizanidine  Review of Systems   Review of Systems  Constitutional: Positive for appetite change and fatigue. Negative for fever.  HENT: Negative for congestion.   Respiratory: Negative for cough and shortness of breath.   Cardiovascular: Negative for chest pain.  Gastrointestinal: Negative for abdominal pain, nausea and vomiting.  Genitourinary: Negative for decreased urine volume, difficulty urinating and dysuria.  Musculoskeletal: Positive for arthralgias and myalgias.  Skin: Negative for rash and wound.  Allergic/Immunologic: Positive for immunocompromised state.  Neurological: Positive for weakness. Negative for  dizziness, speech difficulty and headaches.  Hematological: Negative for adenopathy.  Psychiatric/Behavioral: Negative for confusion.  All other systems reviewed and are negative.   Physical Exam Updated Vital Signs BP 113/75   Pulse 93   Temp 100 F (37.8 C) (Rectal)   Resp (!) 25   SpO2 97%   Physical Exam Vitals and nursing note reviewed.  Constitutional:      General: He is not in acute distress.    Appearance: He is diaphoretic. He is not toxic-appearing.  HENT:     Head: Normocephalic and atraumatic.     Nose: Nose normal.     Mouth/Throat:     Mouth: Mucous membranes are dry.  Eyes:     Conjunctiva/sclera: Conjunctivae normal.  Cardiovascular:     Rate and Rhythm: Normal rate and regular rhythm.     Pulses: Normal pulses.     Heart sounds: Normal heart sounds.  Pulmonary:     Effort: Tachypnea present.  Breath sounds: Normal breath sounds.  Abdominal:     Palpations: Abdomen is soft.     Tenderness: There is no abdominal tenderness.  Musculoskeletal:        General: No swelling or tenderness.     Cervical back: Normal range of motion and neck supple.     Right lower leg: No edema.     Left lower leg: No edema.  Skin:    Findings: No erythema or rash.  Neurological:     Mental Status: He is alert and oriented to person, place, and time.     Sensory: No sensory deficit.     Comments: Normal arm and leg strength however unable to sit up in bed with assistance  Psychiatric:        Behavior: Behavior normal.     ED Results / Procedures / Treatments   Labs (all labs ordered are listed, but only abnormal results are displayed) Labs Reviewed  RESP PANEL BY RT-PCR (FLU A&B, COVID) ARPGX2 - Abnormal; Notable for the following components:      Result Value   SARS Coronavirus 2 by RT PCR POSITIVE (*)    All other components within normal limits  COMPREHENSIVE METABOLIC PANEL - Abnormal; Notable for the following components:   Sodium 113 (*)    Chloride 75  (*)    CO2 20 (*)    Glucose, Bld 339 (*)    BUN 25 (*)    Calcium 7.7 (*)    Total Protein 6.2 (*)    Albumin 2.4 (*)    AST 70 (*)    ALT 53 (*)    Total Bilirubin 1.5 (*)    Anion gap 18 (*)    All other components within normal limits  CBC WITH DIFFERENTIAL/PLATELET - Abnormal; Notable for the following components:   WBC 20.5 (*)    MCHC 36.3 (*)    Platelets 438 (*)    Neutro Abs 18.9 (*)    Lymphs Abs 0.5 (*)    Abs Immature Granulocytes 0.40 (*)    All other components within normal limits  RAPID URINE DRUG SCREEN, HOSP PERFORMED - Abnormal; Notable for the following components:   Tetrahydrocannabinol POSITIVE (*)    All other components within normal limits  URINALYSIS, ROUTINE W REFLEX MICROSCOPIC - Abnormal; Notable for the following components:   APPearance HAZY (*)    Glucose, UA >=500 (*)    Hgb urine dipstick SMALL (*)    Ketones, ur 80 (*)    Nitrite POSITIVE (*)    Bacteria, UA MANY (*)    All other components within normal limits  LACTIC ACID, PLASMA - Abnormal; Notable for the following components:   Lactic Acid, Venous 3.8 (*)    All other components within normal limits  LACTIC ACID, PLASMA - Abnormal; Notable for the following components:   Lactic Acid, Venous 3.2 (*)    All other components within normal limits  PROTIME-INR - Abnormal; Notable for the following components:   Prothrombin Time 16.8 (*)    INR 1.4 (*)    All other components within normal limits  BETA-HYDROXYBUTYRIC ACID - Abnormal; Notable for the following components:   Beta-Hydroxybutyric Acid 3.24 (*)    All other components within normal limits  CBG MONITORING, ED - Abnormal; Notable for the following components:   Glucose-Capillary 378 (*)    All other components within normal limits  URINE CULTURE  CULTURE, BLOOD (ROUTINE X 2)  CULTURE, BLOOD (ROUTINE X 2)  ETHANOL  CK  APTT  MAGNESIUM  SODIUM, URINE, RANDOM  OSMOLALITY  OSMOLALITY, URINE  RENAL FUNCTION PANEL  RENAL  FUNCTION PANEL  RENAL FUNCTION PANEL  LACTIC ACID, PLASMA  LACTIC ACID, PLASMA  HEMOGLOBIN A1C  BLOOD GAS, ARTERIAL  TROPONIN I (HIGH SENSITIVITY)  TROPONIN I (HIGH SENSITIVITY)    EKG EKG Interpretation  Date/Time:  Wednesday January 31 2021 08:47:24 EDT Ventricular Rate:  105 PR Interval:  150 QRS Duration: 106 QT Interval:  365 QTC Calculation: 483 R Axis:   -45 Text Interpretation: Sinus tachycardia Atrial premature complex Inferior infarct, old Consider anterior infarct Baseline wander in lead(s) V3 V4 Confirmed by Kristine Royal [phone]) on 01/31/2021 9:02:03 AM   Radiology DG Chest Port 1 View  Result Date: 01/31/2021 CLINICAL DATA:  Weakness. EXAM: PORTABLE CHEST 1 VIEW COMPARISON:  None. FINDINGS: Cardiomediastinal silhouette is normal. Mediastinal contours appear intact. There is no evidence of focal airspace consolidation, pleural effusion or pneumothorax. Osseous structures are without acute abnormality. Soft tissues are grossly normal. IMPRESSION: No active disease. Electronically Signed   By: Ted Mcalpine M.D.   On: 01/31/2021 09:34    Procedures .Critical Care Performed by: Jeannie Fend, PA-C Authorized by: Jeannie Fend, PA-C   Critical care provider statement:    Critical care time (minutes):  45   Critical care was time spent personally by me on the following activities:  Discussions with consultants, evaluation of patient's response to treatment, examination of patient, ordering and performing treatments and interventions, ordering and review of laboratory studies, ordering and review of radiographic studies, pulse oximetry, re-evaluation of patient's condition, obtaining history from patient or surrogate and review of old charts     Medications Ordered in ED Medications  0.9 %  sodium chloride infusion ( Intravenous New Bag/Given 01/31/21 1220)  insulin aspart (novoLOG) injection 0-15 Units (has no administration in time range)  insulin aspart  (novoLOG) injection 0-5 Units (has no administration in time range)  sodium chloride 0.9 % bolus 1,000 mL (0 mLs Intravenous Stopped 01/31/21 1030)  sodium chloride 0.9 % bolus 1,000 mL (0 mLs Intravenous Stopped 01/31/21 1208)  ceFEPIme (MAXIPIME) 2 g in sodium chloride 0.9 % 100 mL IVPB (0 g Intravenous Stopped 01/31/21 1129)  metroNIDAZOLE (FLAGYL) IVPB 500 mg (0 mg Intravenous Stopped 01/31/21 1208)  vancomycin (VANCOREADY) IVPB 2000 mg/400 mL (0 mg Intravenous Stopped 01/31/21 1320)    ED Course  I have reviewed the triage vital signs and the nursing notes.  Pertinent labs & imaging results that were available during my care of the patient were reviewed by me and considered in my medical decision making (see chart for details).  Clinical Course as of 01/31/21 1328  Wed Jan 31, 2021  4253 55 year old male presents with vague complaint of feeling "unwell from the waist up."  He states this has been ongoing for the past 2 to 3 weeks, progressively worsening to the point for the past few days with he has been unable to ambulate without assistance.  He is unable to pinpoint exactly what is feeling unwell although denies having chest pain, difficulty breathing or abdominal pain.  States that he has not had much to eat lately as he has loss of appetite and feels like the left upper side of his body is swollen. On exam, he is mildly tachypneic and diaphoretic.  CBG checked at time of exam and is 378.  IV fluids ordered as well as broad work-up. Discussed with Dr. Rodena Medin, ER attending,  agrees with plan of care.  [LM]  1201 CBC returns with leukocytosis with white count of 20,000 with increase in neutrophils.  Rectal temp was checked and is 100.0. CMP with hyponatremia with sodium of 113, treated with IV fluids (normal saline).  Bicarb is 20 with glucose of 339 and anion gap of 18, add on beta hydroxybutyrate uric acid.   AST and ALT mildly elevated at 70 and 53, possibly related to recent COVID  infection. Lactic acid elevated at 3.2, pending repeat, given 2 L normal saline. [LM]  1203 Magnesium within normal limits, initial troponin is 8, will trend.  CK normal at 263.  UDS positive for marijuana, alcohol negative. Patient was treated with broad-spectrum antibiotics for undetermined source.  With plan to consult for admission. Urinalysis returns concerning for infection with positive nitrites with many bacteria. [LM]  1203 Case discussed with Dr. Ronaldo Miyamoto with [facility] hospitalist service will consult for admission. [LM]    Clinical Course User Index [LM] Alden Hipp   MDM Rules/Calculators/A&P                         Final Clinical Impression(s) / ED Diagnoses Final diagnoses:  Sepsis without acute organ dysfunction, due to unspecified organism [facility])  Urinary tract infection without hematuria, site unspecified  Diabetic ketoacidosis without coma associated with other specified diabetes mellitus (HCC)  Hyponatremia    Rx / DC Orders ED Discharge Orders    None       Jeannie Fend, PA-C 01/31/21 1328    Wynetta Fines, MD 02/01/21 [phone]

## 2021-01-31 NOTE — Sepsis Progress Note (Signed)
Notified bedside nurse of need to draw blood cultures.  

## 2021-01-31 NOTE — Progress Notes (Addendum)
Temp 101.1. Patient has no tylenol orders. Paged Mansy. New order for PRN tylenol ordered and given. Will continue to monitor.

## 2021-01-31 NOTE — Progress Notes (Signed)
Notified of elevated HR. Given last Na+ and lactic acid, will bolus 500cc LR. EKG ordered. Will also have OT metoprolol.

## 2021-01-31 NOTE — Progress Notes (Signed)
Pt refused his ABG. RN and MD notified. No distress noted.

## 2021-01-31 NOTE — Sepsis Progress Note (Signed)
Notified bedside nurse of need to draw repeat lactic acid. 

## 2021-01-31 NOTE — Progress Notes (Signed)
Noted pts heart rate in 140-150s. Pt denies any chest pain/discomfort. Pt asymptomatic and "feels fine". Pt eating dinner this time. Will continue to monitor. MD Ronaldo Miyamoto notified .

## 2021-01-31 NOTE — Sepsis Progress Note (Signed)
eLink tracking the Code Sepsis. 

## 2021-01-31 NOTE — ED Triage Notes (Signed)
Pt complains of "nerve pain" all over his body for the past 2 weeks. Hx of back surgeries.

## 2021-01-31 NOTE — Telephone Encounter (Signed)
Letter mailed

## 2021-01-31 NOTE — H&P (Addendum)
History and Physical    John Ware MWU:[id number] DOB: 09-22-1965 DOA: 01/31/2021  PCP: Junie Spencer, FNP  Patient coming from: home  Chief Complaint: generalized weakness  HPI: John Ware is a 55 y.o. male with medical history significant of chronic pain, HTN, DM2. Presenting with weakness. History is from wife as the patient is a poor historian. She reports that the patient has been generally unwell over the past 3 weeks or so. He had a positive home COVID test on 01/12/21. He had no respiratory symptoms at the time. He just had fevers that were responsive to APAP. He self isolated at home until 01/20/21 when he was at a party. He apparently fell out of a chair and hurt his shoulder. He was evaluated in the ED and they found no fracture. He was given a sling and told to follow up with orthopedics. Since that time, the wife reports that he's had general body aches/pain that have increased and are not responsive to his regular chronic pain meds. He has had a generally poor appetite during this time. She reports that he's been unable to walk for the last 2 days. These constellation of symptoms were concerning, so she brought him to the ED.   ED Course: He was found to have a sodium of 113, a glucose of 339, a WBC of 20.5, and a lactic acid of 3.8. He was started on broad spec abx and given fluids. TRH was called for admission.   Review of Systems:  Review of systems is otherwise negative for all not mentioned in HPI.   PMHx Past Medical History:  Diagnosis Date  . Depression   . Diabetes mellitus without complication (HCC)   . Hypertension   . Spinal stenosis    With Neurogenic Claudication    PSHx Past Surgical History:  Procedure Laterality Date  . CARPAL TUNNEL RELEASE Bilateral   . LUMBAR LAMINECTOMY/DECOMPRESSION MICRODISCECTOMY N/A 12/13/2014   Procedure: LUMBAR THREE-FOUR, LUMBAR FOUR-FIVE, LUMBAR FIVE-SACRAL ONE LAMINECTOMY;  Surgeon: [name], MD;  Location: [facility];  Service: Neurosurgery;  Laterality: N/A;  L3-4 L4-5 L5-S1 Laminectomy  . MENISCUS REPAIR     Right knee  . 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 VASECTOMY      SocHx  reports that he quit smoking about 10 years ago. His smoking use included cigarettes. He has a 30.00 pack-year smoking history. He has never used smokeless tobacco. He reports current alcohol use. He reports that he does not use drugs.  Allergies  Allergen Reactions  . Tizanidine     Leg cramps    FamHx Family History  Problem Relation Age of Onset  . Colon cancer Neg Hx     Prior to Admission medications   Medication Sig Start Date End Date Taking? Authorizing Provider  acetaminophen (TYLENOL) 325 MG tablet Take 650-975 mg by mouth every 6 (six) hours as needed for mild pain, fever or headache.   Yes [provider]  amLODipine (NORVASC) 5 MG tablet Take 1 tablet (5 mg total) by mouth daily. 06/23/20  Yes Hawks, Christy A, FNP  buPROPion (WELLBUTRIN XL) 150 MG 24 hr tablet TAKE (1) TABLET TWICE A DAY. Patient taking differently: Take 150 mg by mouth 2 (two) times daily. 01/19/21  Yes Hawks, Christy A, FNP  busPIRone (BUSPAR) 5 MG tablet TAKE 1 TABLET 3 TIMES A DAY Patient taking differently: Take 5 mg by mouth 3 (three) times daily.  11/17/20  Yes Hawks, Christy A, FNP  gabapentin (NEURONTIN) 800 MG tablet TAKE (1) TABLET THREE TIMES DAILY. Patient taking differently: Take 800 mg by mouth 3 (three) times daily. 01/19/21  Yes Jannifer Rodney A, FNP  metFORMIN (GLUCOPHAGE XR) 750 MG 24 hr tablet Take 2 tablets (1,500 mg total) by mouth daily with breakfast. 06/23/20 12/20/20 Yes Hawks, Christy A, FNP  methocarbamol (ROBAXIN) 500 MG tablet Take 1 tablet (500 mg total) by mouth 2 (two) times daily as needed for muscle spasms. 01/20/21  Yes Caccavale, Sophia, PA-C  oxyCODONE ER (XTAMPZA ER) 18 MG C12A Take 1 tablet by mouth every 12 (twelve) hours. 11/26/19  Yes [provider]   oxyCODONE-acetaminophen (PERCOCET) 10-325 MG tablet Take 1 tablet by mouth 3 (three) times daily as needed for pain. 01/25/21  Yes [provider]  ramipril (ALTACE) 10 MG capsule TAKE (1) CAPSULE DAILY Patient taking differently: Take 10 mg by mouth daily. 06/23/20  Yes Hawks, Christy A, FNP  sertraline (ZOLOFT) 100 MG tablet Take 1 tablet (100 mg total) by mouth daily. 06/23/20  Yes Hawks, Christy A, FNP  Vitamin D, Ergocalciferol, (DRISDOL) 1.25 MG (50000 UNIT) CAPS capsule Take 50,000 Units by mouth once a week. 01/25/21  Yes [provider]  diclofenac Sodium (VOLTAREN) 1 % GEL Apply 2 g topically 4 (four) times daily. Patient not taking: Reported on 01/31/2021 01/20/21   Caccavale, Sophia, PA-C  empagliflozin (JARDIANCE) 10 MG TABS tablet Take 1 tablet (10 mg total) by mouth daily before breakfast. Patient not taking: Reported on 01/31/2021 10/10/20   Junie Spencer, FNP  atorvastatin (LIPITOR) 20 MG tablet Take 1 tablet (20 mg total) by mouth daily. Patient not taking: Reported on 06/23/2020 02/29/20 08/23/20  Junie Spencer, FNP    Physical Exam: Vitals:   01/31/21 0930 01/31/21 0950 01/31/21 1000 01/31/21 1030  BP: 121/90  116/79 109/79  Pulse: (!) 114  98 100  Resp: (!) 27  (!) 28 20  Temp:  100 F (37.8 C)    TempSrc:  Rectal    SpO2: 96%  (!) 88% 96%    General: 55 y.o. male resting in bed in NAD Eyes: PERRL, normal sclera ENMT: Nares patent w/o discharge, orophaynx clear, dentition normal, ears w/o discharge/lesions/ulcers Neck: Supple, trachea midline Cardiovascular: RRR, +S1, S2, no m/g/r, equal pulses throughout Respiratory: CTABL, no w/r/r, normal WOB GI: BS+, NDNT, no masses noted, no organomegaly noted MSK: No e/c/c Skin: No rashes, bruises, ulcerations noted Neuro: A&O x 3, no focal deficits Psyc: Appropriate interaction and affect, calm/cooperative  Labs on Admission: I have personally reviewed following labs and imaging studies  CBC: Recent  Labs  Lab 01/31/21 0854  WBC 20.5*  NEUTROABS 18.9*  HGB 14.5  HCT 40.0  MCV 84.4  PLT 438*   Basic Metabolic Panel: Recent Labs  Lab 01/31/21 0854  NA 113*  K 3.9  CL 75*  CO2 20*  GLUCOSE 339*  BUN 25*  CREATININE 0.90  CALCIUM 7.7*   GFR: Estimated Creatinine Clearance: 108.8 mL/min (by C-G formula based on SCr of 0.9 mg/dL). Liver Function Tests: Recent Labs  Lab 01/31/21 0854  AST 70*  ALT 53*  ALKPHOS 88  BILITOT 1.5*  PROT 6.2*  ALBUMIN 2.4*   No results for input(s): LIPASE, AMYLASE in the last 168 hours. No results for input(s): AMMONIA in the last 168 hours. Coagulation Profile: No results for input(s): INR, PROTIME in the last 168 hours. Cardiac Enzymes: Recent Labs  Lab  01/31/21 0854  CKTOTAL 263   BNP (last 3 results) No results for input(s): PROBNP in the last 8760 hours. HbA1C: No results for input(s): HGBA1C in the last 72 hours. CBG: Recent Labs  Lab 01/31/21 0841  GLUCAP 378*   Lipid Profile: No results for input(s): CHOL, HDL, LDLCALC, TRIG, CHOLHDL, LDLDIRECT in the last 72 hours. Thyroid Function Tests: No results for input(s): TSH, T4TOTAL, FREET4, T3FREE, THYROIDAB in the last 72 hours. Anemia Panel: No results for input(s): VITAMINB12, FOLATE, FERRITIN, TIBC, IRON, RETICCTPCT in the last 72 hours. Urine analysis:    Component Value Date/Time   COLORURINE YELLOW 01/31/2021 0851   APPEARANCEUR HAZY (A) 01/31/2021 0851   LABSPEC 1.014 01/31/2021 0851   PHURINE 5.0 01/31/2021 0851   GLUCOSEU >=500 (A) 01/31/2021 0851   HGBUR SMALL (A) 01/31/2021 0851   BILIRUBINUR NEGATIVE 01/31/2021 0851   KETONESUR 80 (A) 01/31/2021 0851   PROTEINUR NEGATIVE 01/31/2021 0851   NITRITE POSITIVE (A) 01/31/2021 0851   LEUKOCYTESUR NEGATIVE 01/31/2021 0851    Radiological Exams on Admission: DG Chest Port 1 View  Result Date: 01/31/2021 CLINICAL DATA:  Weakness. EXAM: PORTABLE CHEST 1 VIEW COMPARISON:  None. FINDINGS: Cardiomediastinal  silhouette is normal. Mediastinal contours appear intact. There is no evidence of focal airspace consolidation, pleural effusion or pneumothorax. Osseous structures are without acute abnormality. Soft tissues are grossly normal. IMPRESSION: No active disease. Electronically Signed   By: Ted Mcalpine M.D.   On: 01/31/2021 09:34    EKG: Independently reviewed. Sinus tach  Assessment/Plan Sepsis from UTI?     - admit to inpt, SDU     - continue abx, fluids (see below)     - check procal     - follow Bld Cx, UCx     - CXR is negative   Hyponatremia     - Na+ corrects to 119     - check Uosm, UNa+     - CXR negative     - continue fluids (NS @ 100cc/hr); follow q6h renal fxn panel (aim for 8 - 10 pt correction over 24hrs)     - hold his anxiety medicines for now     - UPDATE: Na+ has come up to 119 (corrected is 124); slow his rate, continue q6h renal fxn checks; if he hits or passes the 8 - 10 pt correction before 24 hours, stop fluids, consider reversal w/ LR  DM2 uncontrolled     - check A1c     - SSI, DM diet, glucose checks     - beta-hydroxybutyric acid is elevated, but right now will run fluids and SQ insulin; follow  HTN     - BP is normotensive right now, hold him regimen for now and reassess in AM  Anxiety     - holding anxiety meds d/t hyponatremia  Elevated LFTs Hyperbilirubiniemia Mildly elevated INR     - likely as a fxn of dehydration/sepsis     - check hepatitis panel, RUQ ab [place], follow     - check fractionated bili     - no evidence of bleed, repeat INR in AM  Generalized weakness     - likely as a fxn of above     - continue treatment of sepsis, hyponatremia     - PT/OT consult  COVID+ test     - he was COVID positive at home 2.5 weeks ago     - CXR is clear and he has no respiratory symptoms     -  he is outside the 10 day isolation period; ok to go to floor  Left shoulder pain     - recent imaging showed no fracture; he was recommended to have  ortho follow up     - let's get his acute issues in line right now; continue outpt ortho follow up  DVT prophylaxis: lovenox  Code Status: FULL  Family Communication: w/ wife by phone  Consults called: None   Status is: Inpatient  Remains inpatient appropriate because:Inpatient level of care appropriate due to severity of illness   Dispo: The patient is from: Home              Anticipated d/c is to: Home              Patient currently is not medically stable to d/c.   Difficult to place patient No  Time spent coordinating admission: 70 minutes  Vearl Allbaugh A Haniyah Maciolek DO [facility]  If 7PM-7AM, please contact night-coverage [email or web address]  01/31/2021, 10:50 AM

## 2021-02-01 ENCOUNTER — Inpatient Hospital Stay (HOSPITAL_COMMUNITY): Payer: Commercial Managed Care - PPO

## 2021-02-01 DIAGNOSIS — F419 Anxiety disorder, unspecified: Secondary | ICD-10-CM

## 2021-02-01 DIAGNOSIS — A419 Sepsis, unspecified organism: Secondary | ICD-10-CM | POA: Insufficient documentation

## 2021-02-01 DIAGNOSIS — B9561 Methicillin susceptible Staphylococcus aureus infection as the cause of diseases classified elsewhere: Secondary | ICD-10-CM

## 2021-02-01 DIAGNOSIS — L8990 Pressure ulcer of unspecified site, unspecified stage: Secondary | ICD-10-CM | POA: Insufficient documentation

## 2021-02-01 DIAGNOSIS — I10 Essential (primary) hypertension: Secondary | ICD-10-CM

## 2021-02-01 DIAGNOSIS — M25512 Pain in left shoulder: Secondary | ICD-10-CM

## 2021-02-01 DIAGNOSIS — R7881 Bacteremia: Secondary | ICD-10-CM

## 2021-02-01 LAB — RENAL FUNCTION PANEL
Albumin: 1.8 g/dL — ABNORMAL LOW (ref 3.5–5.0)
Albumin: 1.9 g/dL — ABNORMAL LOW (ref 3.5–5.0)
Albumin: 2 g/dL — ABNORMAL LOW (ref 3.5–5.0)
Albumin: 2 g/dL — ABNORMAL LOW (ref 3.5–5.0)
Anion gap: 11 (ref 5–15)
Anion gap: 11 (ref 5–15)
Anion gap: 12 (ref 5–15)
Anion gap: 12 (ref 5–15)
BUN: 17 mg/dL (ref 6–20)
BUN: 17 mg/dL (ref 6–20)
BUN: 18 mg/dL (ref 6–20)
BUN: 18 mg/dL (ref 6–20)
CO2: 23 mmol/L (ref 22–32)
CO2: 23 mmol/L (ref 22–32)
CO2: 23 mmol/L (ref 22–32)
CO2: 24 mmol/L (ref 22–32)
Calcium: 7.3 mg/dL — ABNORMAL LOW (ref 8.9–10.3)
Calcium: 7.3 mg/dL — ABNORMAL LOW (ref 8.9–10.3)
Calcium: 7.3 mg/dL — ABNORMAL LOW (ref 8.9–10.3)
Calcium: 7.4 mg/dL — ABNORMAL LOW (ref 8.9–10.3)
Chloride: 83 mmol/L — ABNORMAL LOW (ref 98–111)
Chloride: 83 mmol/L — ABNORMAL LOW (ref 98–111)
Chloride: 84 mmol/L — ABNORMAL LOW (ref 98–111)
Chloride: 85 mmol/L — ABNORMAL LOW (ref 98–111)
Creatinine, Ser: 0.56 mg/dL — ABNORMAL LOW (ref 0.61–1.24)
Creatinine, Ser: 0.56 mg/dL — ABNORMAL LOW (ref 0.61–1.24)
Creatinine, Ser: 0.61 mg/dL (ref 0.61–1.24)
Creatinine, Ser: 0.7 mg/dL (ref 0.61–1.24)
GFR, Estimated: >60 mL/min (ref >60)
GFR, Estimated: >60 mL/min (ref >60)
GFR, Estimated: >60 mL/min (ref >60)
GFR, Estimated: >60 mL/min (ref >60)
Glucose, Bld: 153 mg/dL — ABNORMAL HIGH (ref 70–99)
Glucose, Bld: 212 mg/dL — ABNORMAL HIGH (ref 70–99)
Glucose, Bld: 263 mg/dL — ABNORMAL HIGH (ref 70–99)
Glucose, Bld: 266 mg/dL — ABNORMAL HIGH (ref 70–99)
Phosphorus: 1.4 mg/dL — ABNORMAL LOW (ref 2.5–4.6)
Phosphorus: 1.7 mg/dL — ABNORMAL LOW (ref 2.5–4.6)
Phosphorus: 2.5 mg/dL (ref 2.5–4.6)
Phosphorus: 2.6 mg/dL (ref 2.5–4.6)
Potassium: 3.5 mmol/L (ref 3.5–5.1)
Potassium: 3.5 mmol/L (ref 3.5–5.1)
Potassium: 3.5 mmol/L (ref 3.5–5.1)
Potassium: 3.6 mmol/L (ref 3.5–5.1)
Sodium: 117 mmol/L — CL (ref 135–145)
Sodium: 118 mmol/L — CL (ref 135–145)
Sodium: 119 mmol/L — CL (ref 135–145)
Sodium: 120 mmol/L — ABNORMAL LOW (ref 135–145)

## 2021-02-01 LAB — BLOOD CULTURE ID PANEL (REFLEXED) - BCID2

## 2021-02-01 LAB — CBC
HCT: 35.5 % — ABNORMAL LOW (ref 39.0–52.0)
Hemoglobin: 13.2 g/dL (ref 13.0–17.0)
MCH: 31.1 pg (ref 26.0–34.0)
MCHC: 37.2 g/dL — ABNORMAL HIGH (ref 30.0–36.0)
MCV: 83.5 fL (ref 80.0–100.0)
Platelets: 393 10*3/uL (ref 150–400)
RBC: 4.25 MIL/uL (ref 4.22–5.81)
RDW: 12.9 % (ref 11.5–15.5)
WBC: 19.5 10*3/uL — ABNORMAL HIGH (ref 4.0–10.5)
nRBC: 0 % (ref 0.0–0.2)

## 2021-02-01 LAB — GLUCOSE, CAPILLARY
Glucose-Capillary: 310 mg/dL — ABNORMAL HIGH (ref 70–99)
Glucose-Capillary: 280 mg/dL — ABNORMAL HIGH (ref 70–99)
Glucose-Capillary: 204 mg/dL — ABNORMAL HIGH (ref 70–99)
Glucose-Capillary: 173 mg/dL — ABNORMAL HIGH (ref 70–99)
Glucose-Capillary: 197 mg/dL — ABNORMAL HIGH (ref 70–99)

## 2021-02-01 LAB — HEMOGLOBIN A1C
Hgb A1c MFr Bld: 10.2 % — ABNORMAL HIGH (ref 4.8–5.6)
Mean Plasma Glucose: 246 mg/dL

## 2021-02-01 LAB — PROTIME-INR
INR: 1.3 — ABNORMAL HIGH (ref 0.8–1.2)
Prothrombin Time: 16.3 seconds — ABNORMAL HIGH (ref 11.4–15.2)

## 2021-02-01 LAB — CORTISOL-AM, BLOOD: Cortisol - AM: 34.9 ug/dL — ABNORMAL HIGH (ref 6.7–22.6)

## 2021-02-01 MED ORDER — IOHEXOL 9 MG/ML PO SOLN
500.0000 mL | ORAL | Status: AC
  Administered 2021-02-01 (×2): 500 mL via ORAL

## 2021-02-01 MED ORDER — IOHEXOL 300 MG/ML  SOLN
100.0000 mL | Freq: Once | INTRAMUSCULAR | Status: AC | PRN
  Administered 2021-02-01: 100 mL via INTRAVENOUS

## 2021-02-01 MED ORDER — ACETAMINOPHEN 500 MG PO TABS
1000.0000 mg | ORAL_TABLET | Freq: Four times a day (QID) | ORAL | Status: DC
  Administered 2021-02-01 – 2021-02-02 (×4): 1000 mg via ORAL
  Filled 2021-02-01 (×4): qty 2

## 2021-02-01 MED ORDER — MORPHINE SULFATE (PF) 2 MG/ML IV SOLN
2.0000 mg | INTRAVENOUS | Status: DC | PRN
  Administered 2021-02-01 – 2021-02-02 (×3): 2 mg via INTRAVENOUS
  Filled 2021-02-01 (×3): qty 1

## 2021-02-01 MED ORDER — SODIUM CHLORIDE (PF) 0.9 % IJ SOLN
INTRAMUSCULAR | Status: AC
  Filled 2021-02-01: qty 50

## 2021-02-01 MED ORDER — POTASSIUM PHOSPHATES 15 MMOLE/5ML IV SOLN
30.0000 mmol | Freq: Once | INTRAVENOUS | Status: AC
  Administered 2021-02-01: 30 mmol via INTRAVENOUS
  Filled 2021-02-01: qty 10

## 2021-02-01 MED ORDER — CEFAZOLIN SODIUM-DEXTROSE 2-4 GM/100ML-% IV SOLN
2.0000 g | Freq: Three times a day (TID) | INTRAVENOUS | Status: DC
  Administered 2021-02-01 – 2021-02-28 (×83): 2 g via INTRAVENOUS
  Filled 2021-02-01 (×87): qty 100

## 2021-02-01 MED ORDER — IOHEXOL 9 MG/ML PO SOLN
ORAL | Status: AC
  Filled 2021-02-01: qty 1000

## 2021-02-01 MED ORDER — SODIUM CHLORIDE 3 % IV BOLUS
50.0000 mL | Freq: Once | INTRAVENOUS | Status: AC
  Administered 2021-02-01: 50 mL via INTRAVENOUS
  Filled 2021-02-01: qty 500
  Filled 2021-02-01: qty 50

## 2021-02-01 MED ORDER — GADOBUTROL 1 MMOL/ML IV SOLN
9.0000 mL | Freq: Once | INTRAVENOUS | Status: AC | PRN
  Administered 2021-02-01: 9 mL via INTRAVENOUS

## 2021-02-01 MED ORDER — LIVING WELL WITH DIABETES BOOK
Freq: Once | Status: AC
  Filled 2021-02-01: qty 1

## 2021-02-01 NOTE — Assessment & Plan Note (Addendum)
-   4/4 blood cultures on admission growing MSSA -Repeated blood cultures on 02/01/2021; NGTD - TTE negative for vegetations; cardiology consulted for TEE, 02/05/21: POSITIVE for TV vegetation - now s/p IR drainage of left posterior chest wall fluid collection which was noted to be purulent in nature; culture also growing MSSA - MRI T spine 02/01/21 negative for OM/discitis  - MRI brain 6/12 negative for pathology in visual cortex to explain "floaters" in visual fields; seen by ophtho, negative for endophthalmitis - continue ancef - ID following

## 2021-02-01 NOTE — Assessment & Plan Note (Signed)
-   Wellbutrin and Zoloft on hold for now in setting of hyponatremia

## 2021-02-01 NOTE — Assessment & Plan Note (Addendum)
-   See work-up above - Was previously seen in the ER after his fall and recommended for a sling with outpatient Ortho follow-up -Bilateral shoulder x-rays unremarkable but MRI left shoulder shows large abscess in left axilla; partial width tear of anterior supraspinatus, high-grade partial-thickness articular surface tear of the posterior supraspinatus, high-grade partial-thickness articular surface tear of superior subscapularis, complete tear of biceps long head tendon within the bicipital groove - now s/p left shoulder excisional debridement, deep abscess, with arthrotomy and irrigation and debridement of the joint with biceps tenotomy and excision of necrotic rotator cuff with ortho surgery -Repeat left shoulder CT on 02/13/2021 shows persistent abscess along left chest wall/axilla - plan for repeat imaging next week with ortho and possibly more washout in OR

## 2021-02-01 NOTE — Evaluation (Signed)
Occupational Therapy Evaluation Patient Details Name: John Ware MRN: [id number] DOB: May 15, 1966 Today's Date: 02/01/2021    History of Present Illness Patient is a 55 y.o. male presented to ED with weakness and per wife generally unwell over the past 3 weeks. He had a positive home COVID test on 01/12/21. He had no respiratory symptoms at the time. He just had fevers that were responsive to APAP. He self isolated at home until 01/20/21 when he was at a party and experienced a fall. Pt had pain in Lt shoulder, no acute injury seen on x-ray. PMH significant of chronic pain, HTN, DM2, depression, spinal stenosis.   Clinical Impression   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 On evaluation patient presents with some impaired cognition, generalized weakness, decreased AROM of bilateral shoulders, complaints of pain at multiple sites, decreased activity tolerance, and impaired balance resulting in a sudden decline in functional abilities. Per his report he fell and hurt his shoulder. On assessment left shoulder exhibits some mild edema on anteriorly and laterally and some mild complaints of pain with palpation and AROM. When inspecting scapulas patient found to have edema on his left chest wall - MD in room to assess and some complaints of pain along thoracic spine as well. Patient required max assist x 2 for bed transfers and mod x 2 to stand and take steps to head of bed. Overall patient mod - total assist for ADLs at bed level at this time. Patient will benefit from skilled OT services while in hospital to improve deficits and learn compensatory strategies as needed in order to return to PLOF.  Recommend aggressive short term rehab at discharge to return patient independence.    Follow Up Recommendations  CIR    Equipment Recommendations  Other (comment) (TBD)    Recommendations for Other Services Rehab consult     Precautions /  Restrictions Precautions Precautions: Fall      Mobility Bed Mobility Overal bed mobility: Needs Assistance Bed Mobility: Supine to Sit;Sit to Supine     Supine to sit: Max assist;+2 for physical assistance;+2 for safety/equipment;HOB elevated Sit to supine: Max assist;+2 for physical assistance;+2 for safety/equipment;Total assist   General bed mobility comments: Pt slow to initiate LE mobility to walk LE's to EOB, Min assist required to slide legs toward EOB. Max+2 assist required with use of bed pad to pivot hips and raise trunk upright to sit. 2+ Max assist to control trunk lowering and bring LE's up onto bed to return to supine.    Transfers Overall transfer level: Needs assistance Equipment used: 2 person hand held assist Transfers: Sit to/from Stand Sit to Stand: Mod assist;+2 safety/equipment;+2 physical assistance              Balance Overall balance assessment: Needs assistance Sitting-balance support: Feet supported;Bilateral upper extremity supported Sitting balance-[name]: Fair     Standing balance support: During functional activity;Bilateral upper extremity supported Standing balance-[name]: Poor                             ADL either performed or assessed with clinical judgement   ADL Overall ADL's : Needs assistance/impaired Eating/Feeding: Set up;Minimal assistance   Grooming: Set up;Moderate assistance;Bed level   Upper Body Bathing: Maximal assistance;Bed level   Lower Body Bathing: Maximal assistance;Bed level   Upper Body Dressing : Maximal assistance;Bed level  Lower Body Dressing: Maximal assistance;Bed level   Toilet Transfer: Total assistance   Toileting- Clothing Manipulation and Hygiene: Total assistance               Vision   Vision Assessment?: No apparent visual deficits     Perception     Praxis      Pertinent Vitals/Pain Pain Assessment: Faces Faces Pain Scale: Hurts little more Pain  Location: general discomfort throughout body Pain Descriptors / Indicators: Aching;Discomfort;Grimacing;Guarding Pain Intervention(s): Monitored during session;Limited activity within patient's tolerance;Repositioned     Hand Dominance Left   Extremity/Trunk Assessment Upper Extremity Assessment Upper Extremity Assessment: RUE deficits/detail;LUE deficits/detail RUE Deficits / Details: PROM to approx 90 degrees  - limited by pain; 2+/5 shoulder, otherwise 3+/5 RUE Coordination:  (grossly functional but limited by weakness) LUE Deficits / Details: 2-/5 shoulder, able to grossly passively range to 90 degrees and then patient reports pain, mild edema noted on anterior of shoulder joint and laterally, otherwise 3+/5 LUE Coordination:  (grossly functional but limtied by weakness)   Lower Extremity Assessment Lower Extremity Assessment: Defer to PT evaluation   Cervical / Trunk Assessment Cervical / Trunk Assessment: Normal   Communication Communication Communication: No difficulties   Cognition Arousal/Alertness: Awake/alert Behavior During Therapy: WFL for tasks assessed/performed Overall Cognitive Status: No family/caregiver present to determine baseline cognitive functioning Area of Impairment: Attention;Memory;Following commands;Awareness;Problem solving                   Current Attention Level: Sustained Memory: Decreased recall of precautions;Decreased short-term memory Following Commands: Follows one step commands with increased time;Follows multi-step commands with increased time;Follows multi-step commands inconsistently   Awareness: Emergent Problem Solving: Slow processing;Decreased initiation;Requires verbal cues General Comments: pt limited by pain and lethargy.   General Comments       Exercises     Shoulder Instructions      Home Living Family/patient expects to be discharged to:: Private residence Living Arrangements: Spouse/significant  other Available Help at Discharge: Family Type of Home: House Home Access: Level entry     Home Layout: One level           Bathroom Accessibility: Yes       Additional Comments: pt unreliable historian and no family present. Pt reports prior to fall on 5/28 he was independent.      Prior Functioning/Environment Level of Independence: Independent        Comments: pt unreliable historian and no family present. Pt reports prior to fall on 5/28 he was independent.        OT Problem List: Decreased strength;Decreased range of motion;Decreased activity tolerance;Impaired balance (sitting and/or standing);Decreased coordination;Decreased cognition;Decreased safety awareness;Decreased knowledge of use of DME or AE;Impaired UE functional use;Increased edema;Pain      OT Treatment/Interventions: Self-care/ADL training;Therapeutic exercise;DME and/or AE instruction;Therapeutic activities;Balance training;Patient/family education    OT Goals(Current goals can be found in the care plan section) Acute Rehab OT Goals Patient Stated Goal: to regain strength OT Goal Formulation: With patient Time For Goal Achievement: 02/15/21 Potential to Achieve Goals: Good  OT Frequency: Min 2X/week   Barriers to D/C:            Co-evaluation PT/OT/SLP Co-Evaluation/Treatment: Yes Reason for Co-Treatment: Complexity of the patient's impairments (multi-system involvement);For patient/therapist safety;To address functional/ADL transfers PT goals addressed during session: Mobility/safety with mobility OT goals addressed during session: Strengthening/ROM      AM-PAC OT "6 Clicks" Daily Activity     Outcome Measure Help from another person  eating meals?: A Little Help from another person taking care of personal grooming?: A Lot Help from another person toileting, which includes using toliet, bedpan, or urinal?: Total Help from another person bathing (including washing, rinsing, drying)?: A  Lot Help from another person to put on and taking off regular upper body clothing?: A Lot Help from another person to put on and taking off regular lower body clothing?: A Lot 6 Click Score: 12   End of Session Nurse Communication: Mobility status  Activity Tolerance: No increased pain;Patient limited by fatigue Patient left: in bed;with call bell/phone within reach;with bed alarm set  OT Visit Diagnosis: Unsteadiness on feet (R26.81);Other abnormalities of gait and mobility (R26.89);History of falling (Z91.81);Muscle weakness (generalized) (M62.81);Pain                Time: 7517-0017 OT Time Calculation (min): 28 min Charges:  OT General Charges $OT Visit: 1 Visit OT Evaluation $OT Eval Moderate Complexity: 1 Mod  Greg Eckrich, OTR/L Acute Care Rehab Services  Office [phone] Pager: [phone]   Kelli Churn 02/01/2021, 3:06 PM

## 2021-02-01 NOTE — Progress Notes (Signed)
PHARMACY - PHYSICIAN COMMUNICATION CRITICAL VALUE ALERT - BLOOD CULTURE IDENTIFICATION (BCID)  John Ware is an 55 y.o. male who presented to [facility] on 01/31/2021 with a chief complaint of generalized weakness  Assessment:    Patient diagnosed with COVID on 01/12/21 then had fall with shoulder injury on 01/20/21.  Since this time he has had increased pain, loss of appetite and now unable to walk for 2 days prior to admission.  On admission he was hyponatremia, hyperglycemic.  Also with leukocytosis and elevated pro-calcitonin, persistent fever.  PMH + multiple back surgeries.  Currently on Rocephin for presumed UTI based on urinalysis, however patient has not verbalized any symptoms of UTI.   Blood cx now +GPCC (BCID-MSSA)  Name of physician (or Provider) Contacted: J Mansy  Current antibiotics: Rocephin 1gm IV q24h  Changes to prescribed antibiotics recommended:  Change Ancef 2gm IV q8h F/U ID recommendations (will be auto-consulted for +SA in blood)   Results for orders placed or performed during the hospital encounter of 01/31/21  Blood Culture ID Panel (Reflexed) (Collected: 01/31/2021 10:59 AM)  Result Value Ref Range   Enterococcus faecalis NOT DETECTED NOT DETECTED   Enterococcus Faecium NOT DETECTED NOT DETECTED   Listeria monocytogenes NOT DETECTED NOT DETECTED   Staphylococcus species DETECTED (A) NOT DETECTED   Staphylococcus aureus (BCID) DETECTED (A) NOT DETECTED   Staphylococcus epidermidis NOT DETECTED NOT DETECTED   Staphylococcus lugdunensis NOT DETECTED NOT DETECTED   Streptococcus species NOT DETECTED NOT DETECTED   Streptococcus agalactiae NOT DETECTED NOT DETECTED   Streptococcus pneumoniae NOT DETECTED NOT DETECTED   Streptococcus pyogenes NOT DETECTED NOT DETECTED   A.calcoaceticus-baumannii NOT DETECTED NOT DETECTED   Bacteroides fragilis NOT DETECTED NOT DETECTED   Enterobacterales NOT DETECTED NOT DETECTED   Enterobacter cloacae complex NOT DETECTED  NOT DETECTED   Escherichia coli NOT DETECTED NOT DETECTED   Klebsiella aerogenes NOT DETECTED NOT DETECTED   Klebsiella oxytoca NOT DETECTED NOT DETECTED   Klebsiella pneumoniae NOT DETECTED NOT DETECTED   Proteus species NOT DETECTED NOT DETECTED   Salmonella species NOT DETECTED NOT DETECTED   Serratia marcescens NOT DETECTED NOT DETECTED   Haemophilus influenzae NOT DETECTED NOT DETECTED   Neisseria meningitidis NOT DETECTED NOT DETECTED   Pseudomonas aeruginosa NOT DETECTED NOT DETECTED   Stenotrophomonas maltophilia NOT DETECTED NOT DETECTED   Candida albicans NOT DETECTED NOT DETECTED   Candida auris NOT DETECTED NOT DETECTED   Candida glabrata NOT DETECTED NOT DETECTED   Candida krusei NOT DETECTED NOT DETECTED   Candida parapsilosis NOT DETECTED NOT DETECTED   Candida tropicalis NOT DETECTED NOT DETECTED   Cryptococcus neoformans/gattii NOT DETECTED NOT DETECTED   Meth resistant mecA/C and MREJ NOT DETECTED NOT DETECTED    Junita Push PharmD 02/01/2021  3:05 AM

## 2021-02-01 NOTE — Evaluation (Signed)
Physical Therapy Evaluation Patient Details Name: John Ware MRN: [id number] DOB: May 11, 1966 Today's Date: 02/01/2021   History of Present Illness  Patient is a 55 y.o. male presented to ED with weakness and per wife generally unwell over the past 3 weeks. He had a positive home COVID test on 01/12/21. He had no respiratory symptoms at the time. He just had fevers that were responsive to APAP. He self isolated at home until 01/20/21 when he was at a party and experienced a fall. Pt had pain in Lt shoulder, no acute injury seen on x-ray. PMH significant of chronic pain, HTN, DM2, depression, spinal stenosis.    Clinical Impression  John Ware is 56 y.o. male admitted with above HPI and diagnosis. Patient is currently limited by functional impairments below (see PT problem list). Patient lives with his wife and is independent at baseline however pt unreliable historian and unclear how much assist he has required over the last 3 weeks as pt has had gradual decline per chart. Patient required Max Assist +2 for bed mobility and Mod +2 for st<>stand transfer today. He was limited by tachycardia with HR max of 144 bpm with standing and chair transfer deferred today due to Lt upper/posterior thoracic swelling/mass noted when pt sitting EOB. Pt returned to bed for further imaging per MD who assessed pt while sitting EOB. Patient will benefit from continued skilled PT interventions to address impairments and progress independence with mobility, recommending CIR follow up for intense therapy to regain independence and progress towards PLOF. Acute PT will follow and progress as able.       Follow Up Recommendations  CIR    Equipment Recommendations       Recommendations for Other Services       Precautions / Restrictions Precautions Precautions: Fall      Mobility  Bed Mobility Overal bed mobility: Needs Assistance Bed Mobility: Supine to Sit;Sit to Supine     Supine to sit: Max  assist;+2 for physical assistance;+2 for safety/equipment;HOB elevated Sit to supine: Max assist;+2 for physical assistance;+2 for safety/equipment;Total assist   General bed mobility comments: Pt slow to initiate LE mobility to walk LE's to EOB, Min assist required to slide legs toward EOB. Max+2 assist required with use of bed pad to pivot hips and raise trunk upright to sit. 2+ Max assist to control trunk lowering and bring LE's up onto bed to return to supine.    Transfers Overall transfer level: Needs assistance Equipment used: 2 person hand held assist Transfers: Sit to/from Stand Sit to Stand: Mod assist;+2 safety/equipment;+2 physical assistance  General transfers comments: Mod +2 assist to initiate power up and steady with standing. Pt with posterior lean against EOB to stabilize and manual facilitation of anterior weight shift required. Mod+2 assist for small side steps at EOB to attempt to move closer to [employer]. HR elevated to 144 BPM max with stepping and returned to 120's once sitting EOb and returned to supine.          Ambulation/Gait                Stairs            Wheelchair Mobility    Modified Rankin (Stroke Patients Only)       Balance Overall balance assessment: Needs assistance Sitting-balance support: Feet supported;Bilateral upper extremity supported Sitting balance-[name]: Fair     Standing balance support: During functional activity;Bilateral upper extremity supported Standing balance-[name]: Poor  Pertinent Vitals/Pain Pain Assessment: Faces Faces Pain Scale: Hurts little more Pain Location: general discomfort throughout body Pain Descriptors / Indicators: Aching;Discomfort;Grimacing;Guarding Pain Intervention(s): Monitored during session;Limited activity within patient's tolerance;Repositioned    Home Living Family/patient expects to be discharged to:: Private residence Living  Arrangements: Spouse/significant other Available Help at Discharge: Family Type of Home: House Home Access: Level entry     Home Layout: One level   Additional Comments: pt unreliable historian and no family present. Pt reports prior to fall on 5/28 he was independent.    Prior Function Level of Independence: Independent         Comments: pt unreliable historian and no family present. Pt reports prior to fall on 5/28 he was independent.     Hand Dominance   Dominant Hand: Left    Extremity/Trunk Assessment    Upper Extremity Assessment Defer to OT evaluation  Lower Extremity Assessment  Lower Extremity Assessment Generalized weakness;RLE deficits/detail;LLE deficits/detail  RLE Deficits / Details c/o pain with hip/knee flexion, 3/5 or less for hip extension and 3-/5 for plantar flexion, 3+ for dorsiflexion.  RLE Sensation history of peripheral neuropathy  RLE Coordination decreased gross motor  LLE Deficits / Details c/o pain with hip/knee flexion, 3/5 or less for hip extension and 3-/5 for plantar flexion, 3+ for dorsiflexion.  LLE Sensation history of peripheral neuropathy  LLE Coordination decreased gross motor      Cervical / Trunk Assessment Cervical / Trunk Assessment: Normal  Communication   Communication: No difficulties  Cognition Arousal/Alertness: Awake/alert Behavior During Therapy: WFL for tasks assessed/performed Overall Cognitive Status: No family/caregiver present to determine baseline cognitive functioning Area of Impairment: Attention;Memory;Following commands;Awareness;Problem solving                   Current Attention Level: Sustained Memory: Decreased recall of precautions;Decreased short-term memory Following Commands: Follows one step commands with increased time;Follows multi-step commands with increased time;Follows multi-step commands inconsistently   Awareness: Emergent Problem Solving: Slow processing;Decreased  initiation;Requires verbal cues General Comments: pt limited by pain and lethargy.      General Comments  Pt with edema observed at Lt anterior shoulder. Noted to have large firm bulk along Lt posteriolateral thorax. MD in room and assessed while pt sitting EOB.    Exercises     PT - End of Session  Activity Tolerance Patient tolerated treatment well;Patient limited by fatigue  Patient left in bed;with call bell/phone within reach;with bed alarm set (MD in room)  Nurse Communication Mobility status  PT Assessment  PT Recommendation/Assessment Patient needs continued PT services  PT Visit Diagnosis Muscle weakness (generalized) (M62.81);Unsteadiness on feet (R26.81);History of falling (Z91.81);Difficulty in walking, not elsewhere classified (R26.2)  PT Problem List Decreased strength;Decreased activity tolerance;Decreased balance;Decreased mobility;Decreased range of motion;Decreased cognition;Decreased knowledge of use of DME;Pain  PT Plan  PT Frequency (ACUTE ONLY) Min 4X/week  PT Treatment/Interventions (ACUTE ONLY) DME instruction;Gait training;Stair training;Functional mobility training;Therapeutic activities;Therapeutic exercise;Balance training;Neuromuscular re-education;Patient/family education    Co-evaluation   Reason for Co-Treatment: Complexity of the patient's impairments (multi-system involvement);For patient/therapist safety;To address functional/ADL transfers PT goals addressed during session: Mobility/safety with mobility OT goals addressed during session: Strengthening/ROM          AM-PAC PT "6 Clicks" Mobility Outcome Measure (Version 2)  Help needed turning from your back to your side while in a flat bed without using bedrails? 3  Help needed moving from lying on your back to sitting on the side of a flat bed without using bedrails?  2  Help needed moving to and from a bed to a chair (including a wheelchair)? 2  Help needed standing up from a chair using your  arms (e.g., wheelchair or bedside chair)? 2  Help needed to walk in hospital room? 2  Help needed climbing 3-5 steps with a railing?  1  6 Click Score 12  Consider Recommendation of Discharge To: CIR/SNF/LTACH  PT Recommendation  Recommendations for Other Services Rehab consult  Follow Up Recommendations CIR  PT equipment None recommended by PT (TBD as pt progresses)  Individuals Consulted  Consulted and Agree with Results and Recommendations Patient  Acute Rehab PT Goals  Patient Stated Goal to regain strength  PT Goal Formulation With patient  Time For Goal Achievement 02/15/21  Potential to Achieve Goals Good  PT Time Calculation  PT Start Time (ACUTE ONLY) 1109  PT Stop Time (ACUTE ONLY) 1138  PT Time Calculation (min) (ACUTE ONLY) 29 min  PT General Charges  $$ ACUTE PT VISIT 1 Visit  PT Evaluation  $PT Eval Moderate Complexity 1 Mod  Written Expression  Dominant Hand Left     Wynn Maudlin, DPT Acute Rehabilitation Services Office [phone] Pager [phone]   Anitra Lauth 02/01/2021, 3:13 PM

## 2021-02-01 NOTE — Progress Notes (Signed)
Inpatient Diabetes Program Recommendations  AACE/ADA: New Consensus Statement on Inpatient Glycemic Control (2015)  Target Ranges:  Prepandial:   less than 140 mg/dL      Peak postprandial:   less than 180 mg/dL (1-2 hours)      Critically ill patients:  140 - 180 mg/dL   Lab Results  Component Value Date   GLUCAP 280 (H) 02/01/2021   HGBA1C 10.2 (H) 01/31/2021    Review of Glycemic Control Results for John Ware, John Ware (MRN [id number]) as of 02/01/2021 09:54  Ref. Range 01/31/2021 08:41 01/31/2021 15:41 01/31/2021 19:29 01/31/2021 21:48 02/01/2021 07:30  Glucose-Capillary Latest Ref Range: 70 - 99 mg/dL 505 (H) 397 (H) 673 (H) 310 (H) 280 (H)   Diabetes history: DM2 Outpatient Diabetes medications: Lantus 10 units Current orders for Inpatient glycemic control: Lantus 10 units + Novolog 0-15 units tid + 0-5 units hs  Inpatient Diabetes Program Recommendations:   -Increase Lantus to 18 units daily -Add Novolog 3 units tid meal coverage if eats 50% Ordered Living Well With Diabetes book for patient to review. A1c 10.2 and will plan to speak with patient.  Thank you, John Ware. Annete Ayuso, RN, MSN, CDE  Diabetes Coordinator Inpatient Glycemic Control Team Team Pager [phone] (8am-5pm) 02/01/2021 9:59 AM

## 2021-02-01 NOTE — Hospital Course (Addendum)
John Ware is a 55 y.o. male with medical history significant of chronic pain, HTN, DM2.  He had at home positive COVID test on 01/12/2021.  He had no respiratory symptoms at this time but he did have fevers that was responsive to Tylenol.  Reportedly he apparently fell out of his chair and hurt his left shoulder and he was evaluated by the ED and they found no fracture and he was given a sling and told to follow-up with orthopedics.  Since that time the patient's wife reports that he has had general body aches and pain and has increased symptoms that are not responsive to his chronic pain meds.  He is also had poor appetite during this time and has been unable to walk for last few days.  Because of these constantly some symptoms he is brought to the ED for further evaluation.  In the ED is found to have a sodium of 113 and a glucose of 339 and a WBC of 20.5 as well as a lactic acid of 3.8.  He started on broad-spectrum antibiotics and given fluids.  Soon after his blood cultures became positive for 4-4 MSSA.  He was transitioned to IV Ancef.  With further work-up and confirmation he was also found to have a large fluid collection of left lateral posterior chest and back.  He did have tenderness in his back.  He underwent MRI T-spine and CT of the chest abdomen pelvis.  There is no evidence of any osteomyelitis or discitis or other underlying infection on MRI T-spine.  CT of the chest did show a large multiloculated fluid collection.  IR was consulted and he underwent an ultrasound-guided aspiration was sent for culturing.  CT was concerning for evidence of septic emboli but 2D echo did not reveal any evidence of an endocarditis.  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  Subsequently he still complained of some shoulder pain so he underwent bilateral MRIs of his shoulders and he is still  having fevers.  At the left shoulder was concerned for septic arthritis and left axillary abscess.  Orthopedics was reconsulted as well as IR and the orthopedic surgeon is planning to do a washout tomorrow in edition IR is going to place a drain on the residual fluid collection that may exist after washout.  Because of the surgery ID changed the patient to nafcillin however recommending continuing cefazolin at this time. ESR and CRP and both are elevated.   Patient underwent on 02/08/21 left shoulder excisional debridement, deep abscess with arthrotomy and irrigation debridement of the joint with biceps tenotomy and excision of the necrotic rotator cuff.  Patient is not medically ready to be D/C'd to CIR.   Postoperatively he had a CT scan and repeated and showed a marked increase in size of the abscess collection at the left lateral chest wall with persistent abscess again seen in the subscapular extending cranial to the apex of the left hemithorax.  Since he is post I&D it was unclear about how much of the increase in size of the abscess was due to the postsurgical changes progression of the abscess collection.  Patient did have some small bilateral pleural effusions and bibasilar atelectasis as well as reactive left axillary adenopathy.  Post surgery patient was given Decadron 10 mg last night.  Because of the changes on his repeat CT scan with a left chest  wall abscess with gas on CT General surgery was consulted and they are planning on taking the patient back to the OR to address his right shoulder along with a large left lateral chest wall abscess and this was done 6/18.  ID recommends continuing IV cefazolin currently

## 2021-02-01 NOTE — Assessment & Plan Note (Addendum)
-   Presumed due to dehydration and hypokalemia - severely low on admission, 113 - stabilizing out -BMP daily

## 2021-02-01 NOTE — Final Progress Note (Signed)
Progress Note    BUNYAN BRIER   ZOX:[id number]  DOB: [birth year]  DOA: 01/31/2021     1  PCP: Junie Spencer, FNP  CC: Weakness, ongoing pain  Hospital Course: John Ware is a 55 y.o. male with medical history significant of chronic pain, HTN, DM2. Presenting with weakness. History is from wife as the patient is a poor historian.  She reports that the patient has been generally unwell over the past 3 weeks or so. He had a positive home COVID test on 01/12/21. He had no respiratory symptoms at the time. He just had fevers that were responsive to APAP. He self isolated at home until 01/20/21 when he was at a party. He apparently fell out of a chair and hurt his left shoulder. He was evaluated in the ED and they found no fracture. He was given a sling and told to follow up with orthopedics.  Since that time, the wife reports that he's had general body aches/pain that have increased and are not responsive to his regular chronic pain meds. He has had a generally poor appetite during this time. She reports that he's been unable to walk for the last 2 days. These constellation of symptoms were concerning, so she brought him to the ED.    He was found to have a sodium of 113, a glucose of 339, a WBC of 20.5, and a lactic acid of 3.8. He was started on broad spec abx and given fluids.  Soon after admission, blood cultures became positive for 4/4 bottles GPC.  Antibiotics were transitioned to Ancef.  Interval History:  Seen in his room this morning.  A little more awake and alert compared to description on admission.  Remains slightly a poor historian but able to tell me that he did fall recently and has been hurting in his shoulders, left side, and back.  ROS: Constitutional: positive for fatigue and malaise, negative for night sweats, Respiratory: negative for cough and sputum, Cardiovascular: negative for chest pain, and Gastrointestinal: negative for abdominal pain  Assessment &  Plan: * Severe sepsis (HCC) - Fever, tachycardia, tachypnea, leukocytosis, lactic acidosis - possible skin source vs ?urine (but urine might be seeded also); UA noted with positive nitrite. His traumatic fall is concerning given large left chest wall swelling and L shoulder pain and thoracic back pain b/c now growing GPC bacteremia - 4/4 blood cultures on admission positive for GPC -Continue Ancef for now  Bacteremia due to Staphylococcus aureus - 4/4 blood cultures on admission growing MSSA -Repeat blood cultures on 02/01/2021 -Will likely still need echo. -Unclear source at this time.  Concerning areas including his large left/posterior chest wall swelling concerning for a possible hematoma that may have become infected versus focal thoracic spinal tenderness versus left shoulder from fall -Follow-up repeat blood cultures - continue ancef - follow up CT C/A/P and MRI T spine  Left shoulder pain - See work-up above - Was previously seen in the ER after his fall and recommended for a sling with outpatient Ortho follow-up  Anxiety - Wellbutrin and Zoloft on hold for now in setting of hyponatremia  HTN (hypertension) - Pressure normal on admission.  Will treat if needed  Hyponatremia - Presumed due to dehydration and hypokalemia - Continue fluids - continue trending BMP - Na 113 on admission; avoiding overcorrection  Diabetes mellitus (HCC) - Continue SSI and CBG monitoring -Continue Lantus - A1c 10.2% on 01/31/2021    Old records reviewed in assessment of  this patient  Antimicrobials: Cefepime 01/31/21 x 1 Rocephin 01/31/21 x 1 Vanc 01/31/21 x 1 Ancef 02/01/21 >> current  DVT prophylaxis: enoxaparin (LOVENOX) injection 40 mg Start: 01/31/21 2200   Code Status:   Code Status: Full Code Family Communication:   Disposition Plan: Status is: Inpatient  Remains inpatient appropriate because:Ongoing diagnostic testing needed not appropriate for outpatient work up, IV treatments  appropriate due to intensity of illness or inability to take PO, and Inpatient level of care appropriate due to severity of illness  Dispo: The patient is from: Home              Anticipated d/c is to:  pending PT eval              Patient currently is not medically stable to d/c.   Difficult to place patient No      Risk of unplanned readmission score: Unplanned Admission- Pilot do not use: 17.85   Objective: Blood pressure 98/67, pulse (!) 116, temperature (!) 101.3 F (38.5 C), temperature source Axillary, resp. rate (!) 31, height 5\' 10"  (1.778 m), weight 89.9 kg, SpO2 93 %.  Examination: General appearance: alert, cooperative, no distress, and slowed mentation Head: Normocephalic, without obvious abnormality, atraumatic Eyes:  EOMI Back:  left back noted with large ~10 area of swelling; no erythema, calor Lungs: clear to auscultation bilaterally Chest wall:  mild left chest TTP; swelling from back wraps to left lateral chest wall Heart: regular rate and rhythm, S1, S2 normal, and unable to appreciate any murmurs Abdomen: normal findings: bowel sounds normal and soft, non-tender Extremities:  no edema Skin: mobility and turgor normal Neurologic: Grossly normal  Consultants:  N/a  Procedures:    Data Reviewed: I have personally reviewed following labs and imaging studies Results for orders placed or performed during the hospital encounter of 01/31/21 (from the past 24 hour(s))  Renal function panel     Status: Abnormal   Collection Time: 01/31/21  3:10 PM  Result Value Ref Range   Sodium 119 (LL) 135 - 145 mmol/L   Potassium 3.9 3.5 - 5.1 mmol/L   Chloride 84 (L) 98 - 111 mmol/L   CO2 18 (L) 22 - 32 mmol/L   Glucose, Bld 300 (H) 70 - 99 mg/dL   BUN 20 6 - 20 mg/dL   Creatinine, Ser 4.090.75 0.61 - 1.24 mg/dL   Calcium 7.5 (L) 8.9 - 10.3 mg/dL   Phosphorus 2.8 2.5 - 4.6 mg/dL   Albumin 2.0 (L) 3.5 - 5.0 g/dL   GFR, Estimated >81>60 >19>60 mL/min   Anion gap 17 (H) 5 - 15   Renal function panel     Status: Abnormal   Collection Time: 01/31/21  3:10 PM  Result Value Ref Range   Sodium 117 (LL) 135 - 145 mmol/L   Potassium 3.7 3.5 - 5.1 mmol/L   Chloride 81 (L) 98 - 111 mmol/L   CO2 18 (L) 22 - 32 mmol/L   Glucose, Bld 294 (H) 70 - 99 mg/dL   BUN 20 6 - 20 mg/dL   Creatinine, Ser 1.470.69 0.61 - 1.24 mg/dL   Calcium 7.4 (L) 8.9 - 10.3 mg/dL   Phosphorus 2.8 2.5 - 4.6 mg/dL   Albumin 2.1 (L) 3.5 - 5.0 g/dL   GFR, Estimated >82>60 >95>60 mL/min   Anion gap 18 (H) 5 - 15  Lactic acid, plasma     Status: Abnormal   Collection Time: 01/31/21  3:10 PM  Result Value Ref Range  Lactic Acid, Venous 2.9 (HH) 0.5 - 1.9 mmol/L  CBC     Status: Abnormal   Collection Time: 01/31/21  3:10 PM  Result Value Ref Range   WBC 23.1 (H) 4.0 - 10.5 K/uL   RBC 4.45 4.22 - 5.81 MIL/uL   Hemoglobin 13.7 13.0 - 17.0 g/dL   HCT 67.3 (L) 41.9 - 37.9 %   MCV 84.9 80.0 - 100.0 fL   MCH 30.8 26.0 - 34.0 pg   MCHC 36.2 (H) 30.0 - 36.0 g/dL   RDW 02.4 09.7 - 35.3 %   Platelets 472 (H) 150 - 400 K/uL   nRBC 0.0 0.0 - 0.2 %  Creatinine, serum     Status: None   Collection Time: 01/31/21  3:10 PM  Result Value Ref Range   Creatinine, Ser 0.74 0.61 - 1.24 mg/dL   GFR, Estimated >29 >92 mL/min  Procalcitonin     Status: None   Collection Time: 01/31/21  3:10 PM  Result Value Ref Range   Procalcitonin 6.28 ng/mL  Glucose, capillary     Status: Abnormal   Collection Time: 01/31/21  3:41 PM  Result Value Ref Range   Glucose-Capillary 299 (H) 70 - 99 mg/dL   Comment 1 Notify RN    Comment 2 Document in Chart   Glucose, capillary     Status: Abnormal   Collection Time: 01/31/21  7:29 PM  Result Value Ref Range   Glucose-Capillary 325 (H) 70 - 99 mg/dL  Glucose, capillary     Status: Abnormal   Collection Time: 01/31/21  9:48 PM  Result Value Ref Range   Glucose-Capillary 310 (H) 70 - 99 mg/dL  Renal function panel     Status: Abnormal   Collection Time: 02/01/21 12:22 AM  Result  Value Ref Range   Sodium 119 (LL) 135 - 145 mmol/L   Potassium 3.6 3.5 - 5.1 mmol/L   Chloride 83 (L) 98 - 111 mmol/L   CO2 24 22 - 32 mmol/L   Glucose, Bld 263 (H) 70 - 99 mg/dL   BUN 17 6 - 20 mg/dL   Creatinine, Ser 4.26 0.61 - 1.24 mg/dL   Calcium 7.4 (L) 8.9 - 10.3 mg/dL   Phosphorus 2.6 2.5 - 4.6 mg/dL   Albumin 1.9 (L) 3.5 - 5.0 g/dL   GFR, Estimated >83 >41 mL/min   Anion gap 12 5 - 15  Protime-INR     Status: Abnormal   Collection Time: 02/01/21 12:22 AM  Result Value Ref Range   Prothrombin Time 16.3 (H) 11.4 - 15.2 seconds   INR 1.3 (H) 0.8 - 1.2  Cortisol-am, blood     Status: Abnormal   Collection Time: 02/01/21 12:22 AM  Result Value Ref Range   Cortisol - AM 34.9 (H) 6.7 - 22.6 ug/dL  CBC     Status: Abnormal   Collection Time: 02/01/21 12:22 AM  Result Value Ref Range   WBC 19.5 (H) 4.0 - 10.5 K/uL   RBC 4.25 4.22 - 5.81 MIL/uL   Hemoglobin 13.2 13.0 - 17.0 g/dL   HCT 96.2 (L) 22.9 - 79.8 %   MCV 83.5 80.0 - 100.0 fL   MCH 31.1 26.0 - 34.0 pg   MCHC 37.2 (H) 30.0 - 36.0 g/dL   RDW 92.1 19.4 - 17.4 %   Platelets 393 150 - 400 K/uL   nRBC 0.0 0.0 - 0.2 %  Renal function panel     Status: Abnormal   Collection Time: 02/01/21  6:37 AM  Result Value Ref Range   Sodium 120 (L) 135 - 145 mmol/L   Potassium 3.5 3.5 - 5.1 mmol/L   Chloride 85 (L) 98 - 111 mmol/L   CO2 23 22 - 32 mmol/L   Glucose, Bld 266 (H) 70 - 99 mg/dL   BUN 18 6 - 20 mg/dL   Creatinine, Ser 2.95 0.61 - 1.24 mg/dL   Calcium 7.3 (L) 8.9 - 10.3 mg/dL   Phosphorus 2.5 2.5 - 4.6 mg/dL   Albumin 1.8 (L) 3.5 - 5.0 g/dL   GFR, Estimated >28 >41 mL/min   Anion gap 12 5 - 15  Glucose, capillary     Status: Abnormal   Collection Time: 02/01/21  7:30 AM  Result Value Ref Range   Glucose-Capillary 280 (H) 70 - 99 mg/dL   Comment 1 Notify RN    Comment 2 Document in Chart   Glucose, capillary     Status: Abnormal   Collection Time: 02/01/21 11:53 AM  Result Value Ref Range   Glucose-Capillary  204 (H) 70 - 99 mg/dL   Comment 1 Notify RN    Comment 2 Document in Chart     Recent Results (from the past 240 hour(s))  Urine culture     Status: Abnormal (Preliminary result)   Collection Time: 01/31/21  8:51 AM   Specimen: In/Out Cath Urine  Result Value Ref Range Status   Specimen Description   Final    IN/OUT CATH URINE Performed at [facility], [address] [place], [place] [address]    Special Requests   Final    NONE Performed at [facility], [address], [place], [place] [address]    Culture (A)  Final    >=100,000 COLONIES/mL STAPHYLOCOCCUS AUREUS SUSCEPTIBILITIES TO FOLLOW Performed at [facility], [address] [place], [place] [address]    Report Status PENDING  Incomplete  Resp Panel by RT-PCR (Flu A&B, Covid) Nasopharyngeal Swab     Status: Abnormal   Collection Time: 01/31/21 10:59 AM   Specimen: Nasopharyngeal Swab; Nasopharyngeal(NP) swabs in vial transport medium  Result Value Ref Range Status   SARS Coronavirus 2 by RT PCR POSITIVE (A) NEGATIVE Final    Comment: RESULT CALLED TO, READ BACK BY AND VERIFIED WITH: GARRISON,G. RN AT 1206 01/31/21 MULLINS,T (NOTE) SARS-CoV-2 target nucleic acids are DETECTED.  The SARS-CoV-2 RNA is generally detectable in upper respiratory specimens during the acute phase of infection. Positive results are indicative of the presence of the identified virus, but do not rule out bacterial infection or co-infection with other pathogens not detected by the test. Clinical correlation with patient history and other diagnostic information is necessary to determine patient infection status. The expected result is Negative.  Fact Sheet for Patients: BloggerCourse.com  Fact Sheet for Healthcare Providers: SeriousBroker.it  This test is not yet approved or cleared by the [place] FDA and  has been authorized for detection  and/or diagnosis of SARS-CoV-2 by FDA under an Emergency Use Authorization (EUA).  This EUA will remain in effect (meaning this tes t can be used) for the duration of  the COVID-19 declaration under Section 564(b)(1) of the Act, 21 U.S.C. section 360bbb-3(b)(1), unless the authorization is terminated or revoked sooner.     Influenza A by PCR NEGATIVE NEGATIVE Final   Influenza B by PCR NEGATIVE NEGATIVE Final    Comment: (NOTE) The Xpert Xpress SARS-CoV-2/FLU/RSV plus assay is intended as an aid in the  diagnosis of influenza from Nasopharyngeal swab specimens and should not be used as a sole basis for treatment. Nasal washings and aspirates are unacceptable for Xpert Xpress SARS-CoV-2/FLU/RSV testing.  Fact Sheet for Patients: BloggerCourse.com  Fact Sheet for Healthcare Providers: SeriousBroker.it  This test is not yet approved or cleared by the [place] FDA and has been authorized for detection and/or diagnosis of SARS-CoV-2 by FDA under an Emergency Use Authorization (EUA). This EUA will remain in effect (meaning this test can be used) for the duration of the COVID-19 declaration under Section 564(b)(1) of the Act, 21 U.S.C. section 360bbb-3(b)(1), unless the authorization is terminated or revoked.  Performed at [facility], [address], [place], [place] [address]   Blood Culture (routine x 2)     Status: None (Preliminary result)   Collection Time: 01/31/21 10:59 AM   Specimen: BLOOD  Result Value Ref Range Status   Specimen Description   Final    BLOOD LEFT ANTECUBITAL Performed at [facility], [address] [place], [place] [address]    Special Requests   Final    BOTTLES DRAWN AEROBIC AND ANAEROBIC Blood Culture adequate volume Performed at [facility], [address] [place], [place] [address]    Culture  Setup Time   Final    GRAM POSITIVE  COCCI IN CLUSTERS IN BOTH AEROBIC AND ANAEROBIC BOTTLES CRITICAL RESULT CALLED TO, READ BACK BY AND VERIFIED WITH: PHARMD MICHELLE L. [id number] FCP Performed at [facility], [address], [place], [place] [address]    Culture GRAM POSITIVE COCCI  Final   Report Status PENDING  Incomplete  Blood Culture ID Panel (Reflexed)     Status: Abnormal   Collection Time: 01/31/21 10:59 AM  Result Value Ref Range Status   Enterococcus faecalis NOT DETECTED NOT DETECTED Final   Enterococcus Faecium NOT DETECTED NOT DETECTED Final   Listeria monocytogenes NOT DETECTED NOT DETECTED Final   Staphylococcus species DETECTED (A) NOT DETECTED Final    Comment: CRITICAL RESULT CALLED TO, READ BACK BY AND VERIFIED WITH: PHARMD MICHELLE L. [id number] FCP    Staphylococcus aureus (BCID) DETECTED (A) NOT DETECTED Final    Comment: CRITICAL RESULT CALLED TO, READ BACK BY AND VERIFIED WITH: PHARMD MICHELLE L. [id number] FCP    Staphylococcus epidermidis NOT DETECTED NOT DETECTED Final   Staphylococcus lugdunensis NOT DETECTED NOT DETECTED Final   Streptococcus species NOT DETECTED NOT DETECTED Final   Streptococcus agalactiae NOT DETECTED NOT DETECTED Final   Streptococcus pneumoniae NOT DETECTED NOT DETECTED Final   Streptococcus pyogenes NOT DETECTED NOT DETECTED Final   A.calcoaceticus-baumannii NOT DETECTED NOT DETECTED Final   Bacteroides fragilis NOT DETECTED NOT DETECTED Final   Enterobacterales NOT DETECTED NOT DETECTED Final   Enterobacter cloacae complex NOT DETECTED NOT DETECTED Final   Escherichia coli NOT DETECTED NOT DETECTED Final   Klebsiella aerogenes NOT DETECTED NOT DETECTED Final   Klebsiella oxytoca NOT DETECTED NOT DETECTED Final   Klebsiella pneumoniae NOT DETECTED NOT DETECTED Final   Proteus species NOT DETECTED NOT DETECTED Final   Salmonella species NOT DETECTED NOT DETECTED Final   Serratia marcescens NOT DETECTED NOT DETECTED Final   Haemophilus influenzae NOT  DETECTED NOT DETECTED Final   Neisseria meningitidis NOT DETECTED NOT DETECTED Final   Pseudomonas aeruginosa NOT DETECTED NOT DETECTED Final   Stenotrophomonas maltophilia NOT DETECTED NOT DETECTED Final   Candida albicans NOT DETECTED NOT DETECTED Final   Candida auris  NOT DETECTED NOT DETECTED Final   Candida glabrata NOT DETECTED NOT DETECTED Final   Candida krusei NOT DETECTED NOT DETECTED Final   Candida parapsilosis NOT DETECTED NOT DETECTED Final   Candida tropicalis NOT DETECTED NOT DETECTED Final   Cryptococcus neoformans/gattii NOT DETECTED NOT DETECTED Final   Meth resistant mecA/C and MREJ NOT DETECTED NOT DETECTED Final    Comment: Performed at [facility], [address], [place], [place] [address]  Blood Culture (routine x 2)     Status: None (Preliminary result)   Collection Time: 01/31/21 11:15 AM   Specimen: BLOOD  Result Value Ref Range Status   Specimen Description   Final    BLOOD RIGHT ANTECUBITAL Performed at [facility], [address], [place], [place] [address]    Special Requests   Final    BOTTLES DRAWN AEROBIC AND ANAEROBIC Blood Culture adequate volume Performed at [facility], [address], [place], [place] [address]    Culture  Setup Time   Final    GRAM POSITIVE COCCI IN CLUSTERS IN BOTH AEROBIC AND ANAEROBIC BOTTLES CRITICAL VALUE NOTED.  VALUE IS CONSISTENT WITH PREVIOUSLY REPORTED AND CALLED VALUE. Performed at [facility], [address] [place], [place] [address]    Culture GRAM POSITIVE COCCI  Final   Report Status PENDING  Incomplete  MRSA PCR Screening     Status: None   Collection Time: 01/31/21  2:28 PM   Specimen: Nasal Mucosa; Nasopharyngeal  Result Value Ref Range Status   MRSA by PCR NEGATIVE NEGATIVE Final    Comment:        The GeneXpert MRSA Assay (FDA approved for NASAL specimens only), is one component of a comprehensive MRSA colonization surveillance program. It is  not intended to diagnose MRSA infection nor to guide or monitor treatment for MRSA infections. Performed at [facility], [address], [place], [place] [address]      Radiology Studies: MR THORACIC SPINE W WO CONTRAST  Result Date: 02/01/2021 CLINICAL DATA:  Thoracic region back pain. Recent fall. Question epidural abscess. Bacteremia. EXAM: MRI THORACIC WITHOUT AND WITH CONTRAST TECHNIQUE: Multiplanar and multiecho pulse sequences of the thoracic spine were obtained without and with intravenous contrast. CONTRAST:  35mL GADAVIST GADOBUTROL 1 MMOL/ML IV SOLN COMPARISON:  None. FINDINGS: Alignment:  No abnormal alignment. Vertebrae: In general, the vertebral bodies show loss of height from T5 through T12 is could possibly be seen with chronic osteopenia. No evidence of a focal or recent compression fracture. No sign of bone infection. Cord:  See below regarding stenosis. Paraspinal and other soft tissues: Extensive T2 bright material in the left upper chest wall in the supraclavicular region and between the ribcage and the scapula, with peripheral enhancement. This could be sterile material related to hematoma or could be an infection, either a primary infection or a superinfected hematoma. Consider chest CT for complete evaluation. Disc levels: T1-2 and T2-3: Normal T3-4: Small left posterolateral disc protrusion but without apparent neural compression. T4-5: Congenital spinal stenosis. Chronic appearing central disc herniation. Facet and ligamentous hypertrophy. Spinal stenosis with some cord deformity. T5-6: Congenital spinal stenosis. Chronic appearing broad-based disc herniation more prominent towards the right. Facet and ligamentous hypertrophy. Severe spinal stenosis with cord deformity. T6-7: Congenital spinal stenosis. Chronic appearing broad-based disc herniation. Facet and ligamentous hypertrophy. No visible compressive effect upon the cord. T7-8: Congenital spinal stenosis.  Chronic appearing broad-based disc herniation more prominent towards the left.  Facet and ligamentous hypertrophy. Some deformity of the ventral cord. T8-9: Congenital spinal stenosis. Broad-based disc herniation which could be relatively more recent. Facet and ligamentous prominence. Slight: Indentation of cord. T9-10 congenital spinal stenosis. Chronic appearing left posterolateral disc herniation with mild mass-effect upon the left side of the cord. T10-11: Congenital spinal stenosis. Chronic appearing shallow protrusion of the disc. Facet and ligamentous hypertrophy. Mild deformity of the cord. T11-12: Congenital spinal stenosis. Chronic appearing disc protrusion more prominent towards the right. Facet and ligamentous prominence. Some deformity of the cord. T12-L1: Chronic appearing bilateral posterolateral disc herniations that indent the thecal sac. No distinct neural compression. IMPRESSION: Fluid collection in the left upper posterior chest wall as outlined above that could be sterile hematoma, infected hematoma or abscess on related to hematoma. Consider chest CT with contrast for better evaluation of extent. 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 Stenosis with some cord deformity which could be significant in the region from T4-5 through T11-12 as outlined above. No evidence infection involving the spine or spinal canal. Electronically Signed   By: Paulina Fusi M.D.   On: 02/01/2021 14:20   DG Chest Port 1 View  Result Date: 01/31/2021 CLINICAL DATA:  Weakness. EXAM: PORTABLE CHEST 1 VIEW COMPARISON:  None. FINDINGS: Cardiomediastinal silhouette is normal. Mediastinal contours appear intact. There is no evidence of focal airspace consolidation, pleural effusion or pneumothorax. Osseous structures are without acute abnormality. Soft tissues are grossly normal. IMPRESSION: No active disease.  Electronically Signed   By: Ted Mcalpine M.D.   On: 01/31/2021 09:34   MR THORACIC SPINE W WO CONTRAST  Final Result    DG Chest Port 1 View  Final Result    CT CHEST ABDOMEN PELVIS W CONTRAST    (Results Pending)  DG Shoulder Left    (Results Pending)  DG Shoulder Right    (Results Pending)    Scheduled Meds:  Chlorhexidine Gluconate Cloth  6 each Topical Daily   enoxaparin (LOVENOX) injection  40 mg Subcutaneous Q24H   gabapentin  800 mg Oral TID   insulin aspart  0-15 Units Subcutaneous TID WC   insulin aspart  0-5 Units Subcutaneous QHS   insulin glargine  10 Units Subcutaneous Daily   iohexol  500 mL Oral Q1H   iohexol       living well with diabetes book   Does not apply Once   mouth rinse  15 mL Mouth Rinse BID   oxyCODONE  20 mg Oral Q12H   PRN Meds: acetaminophen, alum & mag hydroxide-simeth, docusate sodium, methocarbamol, ondansetron (ZOFRAN) IV, oxyCODONE, traZODone Continuous Infusions:  sodium chloride 50 mL/hr at 02/01/21 1610    ceFAZolin (ANCEF) IV 2 g (02/01/21 1420)     LOS: 1 day  Time spent: Greater than 50% of the 35 minute visit was spent in counseling/coordination of care for the patient as laid out in the A&P.   Lewie Chamber, MD [facility] 02/01/2021, 2:54 PM

## 2021-02-01 NOTE — Assessment & Plan Note (Addendum)
-   Fever, tachycardia, tachypnea, leukocytosis, lactic acidosis - possible infected hematoma/fluid collection from fall (follow up aspiration culture); urine growing SA but likely is seeded from blood too - 4/4 blood cultures on admission positive for MSSA -Continue abx as noted

## 2021-02-01 NOTE — Progress Notes (Signed)
Rehab Admissions Coordinator Note:  Patient was screened by Clois Dupes for appropriateness for an Inpatient Acute Rehab Consult. Per therapy recs.  At this time, we are recommending Inpatient Rehab consult. I will place order per protocol.  Clois Dupes RN MSN 02/01/2021, 6:34 PM  I can be reached at [phone].

## 2021-02-01 NOTE — Progress Notes (Addendum)
Patient stated to me that before he fell on 5/28, he used to drink 18 - 12 ounce Coors Light about every other day. He states he has had nothing to drink since the fall.

## 2021-02-01 NOTE — Progress Notes (Signed)
Critical value  Na+ 117  MD notified. 0.9% sodium chloride increased.

## 2021-02-01 NOTE — Assessment & Plan Note (Signed)
-   Pressure normal on admission.  Will treat if needed

## 2021-02-01 NOTE — TOC Initial Note (Signed)
Transition of Care [facility]) - Initial/Assessment Note    Patient Details  Name: John Ware MRN: [id number] Date of Birth: May 10, 1966  [facility]) CM/SW Contact:    Golda Acre, RN Phone Number: 02/01/2021, 8:43 AM  Clinical Narrative:                 55 y.o. male with medical history significant of chronic pain, HTN, DM2. Presenting with weakness. History is from wife as the patient is a poor historian. She reports that the patient has been generally unwell over the past 3 weeks or so. He had a positive home COVID test on 01/12/21. He had no respiratory symptoms at the time. He just had fevers that were responsive to APAP. He self isolated at home until 01/20/21 when he was at a party. He apparently fell out of a chair and hurt his shoulder. He was evaluated in the ED and they found no fracture. He was given a sling and told to follow up with orthopedics. Since that time, the wife reports that he's had general body aches/pain that have increased and are not responsive to his regular chronic pain meds. He has had a generally poor appetite during this time. She reports that he's been unable to walk for the last 2 days. These constellation of symptoms were concerning, so she brought him to the ED.  PLAN: following for progress patient admits drinking at home everyday has no alcohol since admission.From hom,e with wife. Expected Discharge Plan: Home/Self Care Barriers to Discharge: Continued Medical Work up   Patient Goals and CMS Choice Patient states their goals for this hospitalization and ongoing recovery are:: not stated      Expected Discharge Plan and Services Expected Discharge Plan: Home/Self Care   Discharge Planning Services: CM Consult   Living arrangements for the past 2 months: Single Family Home                                      Prior Living Arrangements/Services Living arrangements for the past 2 months: Single Family Home Lives with::  Spouse Patient language and need for interpreter reviewed:: Yes Do you feel safe going back to the place where you live?: Yes      Need for Family Participation in Patient Care: Yes (Comment) Care giver support system in place?: Yes (comment)   Criminal Activity/Legal Involvement Pertinent to Current Situation/Hospitalization: No - Comment as needed  Activities of Daily Living Home Assistive Devices/Equipment: Eyeglasses, CBG Meter ADL Screening (condition at time of admission) Patient's cognitive ability adequate to safely complete daily activities?: Yes Is the patient deaf or have difficulty hearing?: No Does the patient have difficulty seeing, even when wearing glasses/contacts?: No Does the patient have difficulty concentrating, remembering, or making decisions?: No Patient able to express need for assistance with ADLs?: Yes Does the patient have difficulty dressing or bathing?: No Independently performs ADLs?: Yes (appropriate for developmental age) Does the patient have difficulty walking or climbing stairs?: Yes (secondary to body pain) Weakness of Legs: None Weakness of Arms/Hands: None  Permission Sought/Granted                  Emotional Assessment Appearance:: Appears stated age Attitude/Demeanor/Rapport: Engaged Affect (typically observed): Calm Orientation: : Oriented to Place, Oriented to Self, Oriented to  Time, Oriented to Situation Alcohol / Substance Use: Alcohol Use Psych Involvement: No (comment)  Admission  diagnosis:  Hyponatremia [E87.1] Urinary tract infection without hematuria, site unspecified [N39.0] Diabetic ketoacidosis without coma associated with other specified diabetes mellitus (HCC) [E13.10] Sepsis without acute organ dysfunction, due to unspecified organism [facility]) [A41.9] Patient Active Problem List   Diagnosis Date Noted   Pressure injury of skin 02/01/2021   Hyponatremia 01/31/2021   Positive colorectal cancer screening using Cologuard  test 12/28/2020   DOE (dyspnea on exertion) 10/20/2020   Diabetic polyneuropathy associated with type 2 diabetes mellitus (HCC) 10/09/2020   Diabetes mellitus (HCC) 10/30/2018   Lumbar stenosis with neurogenic claudication 10/14/2017   DDD (degenerative disc disease), lumbar 02/17/2017   Left leg weakness 02/05/2017   Hyperlipidemia associated with type 2 diabetes mellitus (HCC) 10/24/2016   Depression, recurrent (HCC) 10/24/2016   Right knee pain 05/21/2016   DDD (degenerative disc disease), lumbosacral 05/21/2016   Overweight (BMI 25.0-29.9) 05/21/2016   Lumbar stenosis 12/13/2014   PCP:  Junie Spencer, FNP Pharmacy:   [facility] [place], [place] - [address] [place] [address] Phone: [phone] Fax: [phone]     Social Determinants of Health (SDOH) Interventions    Readmission Risk Interventions No flowsheet data found.

## 2021-02-01 NOTE — Assessment & Plan Note (Addendum)
-   Continue SSI and CBG monitoring -Continue Lantus - A1c 10.2% on 01/31/2021

## 2021-02-01 NOTE — Consult Note (Signed)
[facility]  Total days of antibiotics 2/vanco/cefepime/metronidazole               Reason for Consult:MSSA bacteremia   Referring Physician: girguis  Principal Problem:   Severe sepsis (HCC) Active Problems:   Diabetes mellitus (HCC)   Hyponatremia   Pressure injury of skin   Bacteremia due to Staphylococcus aureus   HTN (hypertension)   Anxiety   Left shoulder pain    HPI: John Ware is a 55 y.o. male T2DM with peripheral neuropathy, spinal stenosis with claudication, HTN, admitted with worsening ability to walk, altered mental status and poor appetite. He reports that he recently recovered from covid. Presented with high fevers of 103F, and cough. Improved with acetominophen, didn't take any antivirals. He recovered and stated that he recently fell injured his left shoulder but did not break arm. He has felt poorly over the last 3 weeks. On admit, he was found to have sodium of 113, elevated glucose 300s. Leukocytosis of 20K ,with lactic acidosis of 3.8. he was started on broad spectrum and IVF. Infectious work up revealed that he had MSSA bacteremia. He is still confused when this morning when describing the events that led him to the hospital. Still has ongoing fevers  [employer]: retired from [employer] account executive  Past Medical History:  Diagnosis Date   Depression    Diabetes mellitus without complication (HCC)    Hypertension    Spinal stenosis    With Neurogenic Claudication    Allergies:  Allergies  Allergen Reactions   Tizanidine     Leg cramps    MEDICATIONS:  acetaminophen  1,000 mg Oral Q6H   Chlorhexidine Gluconate Cloth  6 each Topical Daily   enoxaparin (LOVENOX) injection  40 mg Subcutaneous Q24H   gabapentin  800 mg Oral TID   insulin aspart  0-15 Units Subcutaneous TID WC   insulin aspart  0-5 Units Subcutaneous QHS   insulin glargine  10 Units Subcutaneous Daily   iohexol  500 mL Oral Q1H   iohexol       mouth rinse  15 mL  Mouth Rinse BID   oxyCODONE  20 mg Oral Q12H    Social History   Tobacco Use   Smoking status: Former    Packs/day: 2.00    Years: 15.00    Pack years: 30.00    Types: Cigarettes    Quit date: 2012    Years since quitting: 10.4   Smokeless tobacco: Never  Vaping Use   Vaping Use: Never used  Substance Use Topics   Alcohol use: Yes    Comment: occasional   Drug use: No    Family History  Problem Relation Age of Onset   Colon cancer Neg Hx     Review of Systems -   Constitutional: positivefor fever, chills, diaphoresis, activity change, appetite change, fatigue and unexpected weight change.  HENT: Negative for congestion, sore throat, rhinorrhea, sneezing, trouble swallowing and sinus pressure.  Eyes: Negative for photophobia and visual disturbance.  Respiratory: Negative for cough, chest tightness, shortness of breath, wheezing and stridor.  Cardiovascular: Negative for chest pain, palpitations and leg swelling.  Gastrointestinal: Negative for nausea, vomiting, abdominal pain, diarrhea, constipation, blood in stool, abdominal distention and anal bleeding.  Genitourinary: Negative for dysuria, hematuria, flank pain and difficulty urinating.  Musculoskeletal: left  shoulder pain. Negative for myalgias, back pain, joint swelling, arthralgias and gait problem.  Skin: Negative for color change, pallor, rash and wound.  Neurological: Negative for dizziness, tremors, weakness and light-headedness.  Hematological: Negative for adenopathy. Does not bruise/bleed easily.  Psychiatric/Behavioral: Negative for behavioral problems, confusion, sleep disturbance, dysphoric mood, decreased concentration and agitation.    OBJECTIVE: Temp:  [98.1 F (36.7 C)-101.3 F (38.5 C)] 101.3 F (38.5 C) (06/09 1202) Pulse Rate:  [40-148] 105 (06/09 1500) Resp:  [16-34] 21 (06/09 1500) BP: (95-163)/(50-128) 118/83 (06/09 1500) SpO2:  [89 %-99 %] 98 % (06/09 1500) Physical Exam   Constitutional: He is oriented to person, place, and time. He appears older than stated ageand well-nourished. No distress.  HENT:  Mouth/Throat: Oropharynx is clear and moist. No oropharyngeal exudate.  Cardiovascular: Normal rate, regular rhythm and normal heart sounds. Exam reveals no gallop and no friction rub.  No murmur heard.  Pulmonary/Chest: Effort normal and breath sounds normal. No respiratory distress. He has no wheezes.  Abdominal: Soft. Bowel sounds are normal. He exhibits no distension. There is no tenderness.  Lymphadenopathy:  He has no cervical adenopathy.  Neurological: He is alert and oriented to person, place, and time.  Skin: Skin is warm and dry. Papules to right dorsum on hand. Folliculitis to left leg. No other stigmata of endocarditis. Hair loss to legs NFA:OZHYQMVExt:charcot foot appearance Psychiatric: non-sequitor speech   LABS: Results for orders placed or performed during the hospital encounter of 01/31/21 (from the past 48 hour(s))  CBG monitoring, ED     Status: Abnormal   Collection Time: 01/31/21  8:41 AM  Result Value Ref Range   Glucose-Capillary 378 (H) 70 - 99 mg/dL    Comment: Glucose reference range applies only to samples taken after fasting for at least 8 hours.  Urine rapid drug screen (hosp performed)     Status: Abnormal   Collection Time: 01/31/21  8:51 AM  Result Value Ref Range   Opiates NONE DETECTED NONE DETECTED   Cocaine NONE DETECTED NONE DETECTED   Benzodiazepines NONE DETECTED NONE DETECTED   Amphetamines NONE DETECTED NONE DETECTED   Tetrahydrocannabinol POSITIVE (A) NONE DETECTED   Barbiturates NONE DETECTED NONE DETECTED    Comment: (NOTE) DRUG SCREEN FOR MEDICAL PURPOSES ONLY.  IF CONFIRMATION IS NEEDED FOR ANY PURPOSE, NOTIFY LAB WITHIN 5 DAYS.  LOWEST DETECTABLE LIMITS FOR URINE DRUG SCREEN Drug Class                     Cutoff (ng/mL) Amphetamine and metabolites    1000 Barbiturate and metabolites    200 Benzodiazepine                  200 Tricyclics and metabolites     300 Opiates and metabolites        300 Cocaine and metabolites        300 THC                            50 Performed at [facility], [address] [place], [place] [id number]   Urinalysis, Routine w reflex microscopic Urine, Clean Catch     Status: Abnormal   Collection Time: 01/31/21  8:51 AM  Result Value Ref Range   Color, Urine YELLOW YELLOW   APPearance HAZY (A) CLEAR   Specific Gravity, Urine 1.014 1.005 - 1.030   pH 5.0 5.0 - 8.0   Glucose, UA >=500 (A) NEGATIVE mg/dL   Hgb urine dipstick SMALL (A) NEGATIVE   Bilirubin Urine NEGATIVE NEGATIVE   Ketones, ur 80 (  A) NEGATIVE mg/dL   Protein, ur NEGATIVE NEGATIVE mg/dL   Nitrite POSITIVE (A) NEGATIVE   Leukocytes,Ua NEGATIVE NEGATIVE   RBC / HPF 0-5 0 - 5 RBC/hpf   WBC, UA 6-10 0 - 5 WBC/hpf   Bacteria, UA MANY (A) NONE SEEN   Squamous Epithelial / LPF 0-5 0 - 5   Mucus PRESENT     Comment: Performed at [facility], [address], [place], [place] [address]  Urine culture     Status: Abnormal (Preliminary result)   Collection Time: 01/31/21  8:51 AM   Specimen: In/Out Cath Urine  Result Value Ref Range   Specimen Description      IN/OUT CATH URINE Performed at [facility], [address], [place], [place] [address]    Special Requests      NONE Performed at [facility], [address], [place], [place] [address]    Culture (A)     >=100,000 COLONIES/mL STAPHYLOCOCCUS AUREUS SUSCEPTIBILITIES TO FOLLOW Performed at [facility], [address], [place], [place] [address]    Report Status PENDING   Sodium, urine, random     Status: None   Collection Time: 01/31/21  8:51 AM  Result Value Ref Range   Sodium, Ur <10 mmol/L    Comment: Performed at [facility], [address], [place], [place] [address]  Osmolality, urine     Status: None   Collection Time:  01/31/21  8:51 AM  Result Value Ref Range   Osmolality, Ur 444 300 - 900 mOsm/kg    Comment: Performed at [facility], [address] [place], [place] [address]  Comprehensive metabolic panel     Status: Abnormal   Collection Time: 01/31/21  8:54 AM  Result Value Ref Range   Sodium 113 (LL) 135 - 145 mmol/L    Comment: CRITICAL RESULT CALLED TO, READ BACK BY AND VERIFIED WITH: GARRISON,G. RN AT 1027 01/31/21 MULLINS,T    Potassium 3.9 3.5 - 5.1 mmol/L   Chloride 75 (L) 98 - 111 mmol/L   CO2 20 (L) 22 - 32 mmol/L   Glucose, Bld 339 (H) 70 - 99 mg/dL    Comment: Glucose reference range applies only to samples taken after fasting for at least 8 hours.   BUN 25 (H) 6 - 20 mg/dL   Creatinine, Ser 5.78 0.61 - 1.24 mg/dL   Calcium 7.7 (L) 8.9 - 10.3 mg/dL   Total Protein 6.2 (L) 6.5 - 8.1 g/dL   Albumin 2.4 (L) 3.5 - 5.0 g/dL   AST 70 (H) 15 - 41 U/L   ALT 53 (H) 0 - 44 U/L   Alkaline Phosphatase 88 38 - 126 U/L   Total Bilirubin 1.5 (H) 0.3 - 1.2 mg/dL   GFR, Estimated >46 >96 mL/min    Comment: (NOTE) Calculated using the CKD-EPI Creatinine Equation (2021)    Anion gap 18 (H) 5 - 15    Comment: Performed at [facility], [address] [place], [place] [address]  CBC with Differential     Status: Abnormal   Collection Time: 01/31/21  8:54 AM  Result Value Ref Range   WBC 20.5 (H) 4.0 - 10.5 K/uL   RBC 4.74 4.22 - 5.81 MIL/uL   Hemoglobin 14.5 13.0 - 17.0 g/dL   HCT 41.3 24.4 - 01.0 %   MCV 84.4 80.0 - 100.0 fL   MCH 30.6 26.0 - 34.0 pg  MCHC 36.3 (H) 30.0 - 36.0 g/dL   RDW 40.9 81.1 - 91.4 %   Platelets 438 (H) 150 - 400 K/uL   nRBC 0.0 0.0 - 0.2 %   Neutrophils Relative % 92 %   Neutro Abs 18.9 (H) 1.7 - 7.7 K/uL   Lymphocytes Relative 3 %   Lymphs Abs 0.5 (L) 0.7 - 4.0 K/uL   Monocytes Relative 3 %   Monocytes Absolute 0.6 0.1 - 1.0 K/uL   Eosinophils Relative 0 %   Eosinophils Absolute 0.0 0.0 - 0.5 K/uL   Basophils Relative 0 %    Basophils Absolute 0.0 0.0 - 0.1 K/uL   WBC Morphology DOHLE BODIES     Comment: TOXIC GRANULATION   Immature Granulocytes 2 %   Abs Immature Granulocytes 0.40 (H) 0.00 - 0.07 K/uL    Comment: Performed at [facility], [address], [name], [place] [address]  Ethanol     Status: None   Collection Time: 01/31/21  8:54 AM  Result Value Ref Range   Alcohol, Ethyl (B) <10 <10 mg/dL    Comment: (NOTE) Lowest detectable limit for serum alcohol is 10 mg/dL.  For medical purposes only. Performed at [facility], [address], [place], [place] [address]   Lactic acid, plasma     Status: Abnormal   Collection Time: 01/31/21  8:54 AM  Result Value Ref Range   Lactic Acid, Venous 3.8 (HH) 0.5 - 1.9 mmol/L    Comment: CRITICAL RESULT CALLED TO, READ BACK BY AND VERIFIED WITH: GARRISON,G. RN AT 1027 01/31/21 MULLINS,T Performed at [facility], [address] [place], [place] [address]   Troponin I (High Sensitivity)     Status: None   Collection Time: 01/31/21  8:54 AM  Result Value Ref Range   Troponin I (High Sensitivity) 9 <18 ng/L    Comment: (NOTE) Elevated high sensitivity troponin I (hsTnI) values and significant  changes across serial measurements may suggest ACS but many other  chronic and acute conditions are known to elevate hsTnI results.  Refer to the Links section for chest pain algorithms and additional  guidance. Performed at [facility], [address] [place], [place] [address]   CK     Status: None   Collection Time: 01/31/21  8:54 AM  Result Value Ref Range   Total CK 263 49 - 397 U/L    Comment: Performed at [facility], [address] [place], [place] [address]  Hemoglobin A1c     Status: Abnormal   Collection Time: 01/31/21  8:54 AM  Result Value Ref Range   Hgb A1c MFr Bld 10.2 (H) 4.8 - 5.6 %    Comment: (NOTE)         Prediabetes: 5.7 - 6.4          Diabetes: >6.4         Glycemic control for adults with diabetes: <7.0    Mean Plasma Glucose 246 mg/dL    Comment: (NOTE) Performed At: [facility] [address] [place], [place] [id number] Jolene Schimke MD VO:[phone]   Lactic acid, plasma     Status: Abnormal   Collection Time: 01/31/21 10:59 AM  Result Value Ref Range   Lactic Acid, Venous 3.2 (HH) 0.5 - 1.9 mmol/L    Comment: CRITICAL VALUE NOTED.  VALUE IS CONSISTENT WITH PREVIOUSLY REPORTED AND CALLED VALUE. Performed at [facility], [address] [place], [place]  [address]   Resp Panel by RT-PCR (Flu A&B, Covid) Nasopharyngeal Swab     Status: Abnormal   Collection Time: 01/31/21 10:59 AM   Specimen: Nasopharyngeal Swab; Nasopharyngeal(NP) swabs in vial transport medium  Result Value Ref Range   SARS Coronavirus 2 by RT PCR POSITIVE (A) NEGATIVE    Comment: RESULT CALLED TO, READ BACK BY AND VERIFIED WITH: GARRISON,G. RN AT 1206 01/31/21 MULLINS,T (NOTE) SARS-CoV-2 target nucleic acids are DETECTED.  The SARS-CoV-2 RNA is generally detectable in upper respiratory specimens during the acute phase of infection. Positive results are indicative of the presence of the identified virus, but do not rule out bacterial infection or co-infection with other pathogens not detected by the test. Clinical correlation with patient history and other diagnostic information is necessary to determine patient infection status. The expected result is Negative.  Fact Sheet for Patients: BloggerCourse.com  Fact Sheet for Healthcare Providers: SeriousBroker.it  This test is not yet approved or cleared by the [place] FDA and  has been authorized for detection and/or diagnosis of SARS-CoV-2 by FDA under an Emergency Use Authorization (EUA).  This EUA will remain in effect (meaning this tes t can be used) for the duration of  the COVID-19 declaration  under Section 564(b)(1) of the Act, 21 U.S.C. section 360bbb-3(b)(1), unless the authorization is terminated or revoked sooner.     Influenza A by PCR NEGATIVE NEGATIVE   Influenza B by PCR NEGATIVE NEGATIVE    Comment: (NOTE) The Xpert Xpress SARS-CoV-2/FLU/RSV plus assay is intended as an aid in the diagnosis of influenza from Nasopharyngeal swab specimens and should not be used as a sole basis for treatment. Nasal washings and aspirates are unacceptable for Xpert Xpress SARS-CoV-2/FLU/RSV testing.  Fact Sheet for Patients: BloggerCourse.com  Fact Sheet for Healthcare Providers: SeriousBroker.it  This test is not yet approved or cleared by the [place] FDA and has been authorized for detection and/or diagnosis of SARS-CoV-2 by FDA under an Emergency Use Authorization (EUA). This EUA will remain in effect (meaning this test can be used) for the duration of the COVID-19 declaration under Section 564(b)(1) of the Act, 21 U.S.C. section 360bbb-3(b)(1), unless the authorization is terminated or revoked.  Performed at [facility], [address] [place], [place] [address]   Troponin I (High Sensitivity)     Status: None   Collection Time: 01/31/21 10:59 AM  Result Value Ref Range   Troponin I (High Sensitivity) 8 <18 ng/L    Comment: (NOTE) Elevated high sensitivity troponin I (hsTnI) values and significant  changes across serial measurements may suggest ACS but many other  chronic and acute conditions are known to elevate hsTnI results.  Refer to the "Links" section for chest pain algorithms and additional  guidance. Performed at [facility], [address], [place], [place] [address]   Protime-INR     Status: Abnormal   Collection Time: 01/31/21 10:59 AM  Result Value Ref Range   Prothrombin Time 16.8 (H) 11.4 - 15.2 seconds   INR 1.4 (H) 0.8 - 1.2    Comment: (NOTE) INR goal  varies based on device and disease states. Performed at [facility], [address] [place], [place] [address]   APTT     Status: None   Collection Time: 01/31/21 10:59 AM  Result Value Ref Range   aPTT 24 24 - 36 seconds    Comment: Performed at [facility], [address], [place], [place] [address]  Blood Culture (routine x 2)     Status: None (Preliminary result)   Collection Time: 01/31/21 10:59 AM   Specimen: BLOOD  Result Value Ref Range   Specimen Description      BLOOD LEFT ANTECUBITAL Performed at [facility], [address] [place], [place] [address]    Special Requests      BOTTLES DRAWN AEROBIC AND ANAEROBIC Blood Culture adequate volume Performed at [facility], [address], [place], [place] [address]    Culture  Setup Time      GRAM POSITIVE COCCI IN CLUSTERS IN BOTH AEROBIC AND ANAEROBIC BOTTLES CRITICAL RESULT CALLED TO, READ BACK BY AND VERIFIED WITH: PHARMD MICHELLE L. [id number] FCP Performed at [facility], [address], [place], [place] [address]    Culture GRAM POSITIVE COCCI    Report Status PENDING   Magnesium     Status: None   Collection Time: 01/31/21 10:59 AM  Result Value Ref Range   Magnesium 2.2 1.7 - 2.4 mg/dL    Comment: Performed at [facility], [address], [place], [place] [address]  Beta-hydroxybutyric acid     Status: Abnormal   Collection Time: 01/31/21 10:59 AM  Result Value Ref Range   Beta-Hydroxybutyric Acid 3.24 (H) 0.05 - 0.27 mmol/L    Comment: Performed at [facility], [address] [place], [place] [address]  Osmolality     Status: Abnormal   Collection Time: 01/31/21 10:59 AM  Result Value Ref Range   Osmolality 261 (L) 275 - 295 mOsm/kg    Comment: Performed at [facility], [address] [place], [place] [address]  Blood Culture ID Panel (Reflexed)     Status: Abnormal    Collection Time: 01/31/21 10:59 AM  Result Value Ref Range   Enterococcus faecalis NOT DETECTED NOT DETECTED   Enterococcus Faecium NOT DETECTED NOT DETECTED   Listeria monocytogenes NOT DETECTED NOT DETECTED   Staphylococcus species DETECTED (A) NOT DETECTED    Comment: CRITICAL RESULT CALLED TO, READ BACK BY AND VERIFIED WITH: PHARMD MICHELLE L. [phone] [id number] FCP    Staphylococcus aureus (BCID) DETECTED (A) NOT DETECTED    Comment: CRITICAL RESULT CALLED TO, READ BACK BY AND VERIFIED WITH: PHARMD MICHELLE L. [id number] FCP    Staphylococcus epidermidis NOT DETECTED NOT DETECTED   Staphylococcus lugdunensis NOT DETECTED NOT DETECTED   Streptococcus species NOT DETECTED NOT DETECTED   Streptococcus agalactiae NOT DETECTED NOT DETECTED   Streptococcus pneumoniae NOT DETECTED NOT DETECTED   Streptococcus pyogenes NOT DETECTED NOT DETECTED   A.calcoaceticus-baumannii NOT DETECTED NOT DETECTED   Bacteroides fragilis NOT DETECTED NOT DETECTED   Enterobacterales NOT DETECTED NOT DETECTED   Enterobacter cloacae complex NOT DETECTED NOT DETECTED   Escherichia coli NOT DETECTED NOT DETECTED   Klebsiella aerogenes NOT DETECTED NOT DETECTED   Klebsiella oxytoca NOT DETECTED NOT DETECTED   Klebsiella pneumoniae NOT DETECTED NOT DETECTED   Proteus species NOT DETECTED NOT DETECTED   Salmonella species NOT DETECTED NOT DETECTED   Serratia marcescens NOT DETECTED NOT DETECTED   Haemophilus influenzae NOT DETECTED NOT DETECTED   Neisseria meningitidis NOT DETECTED NOT DETECTED   Pseudomonas aeruginosa NOT DETECTED NOT DETECTED   Stenotrophomonas maltophilia NOT DETECTED NOT DETECTED   Candida albicans NOT DETECTED NOT DETECTED   Candida auris NOT DETECTED NOT DETECTED   Candida glabrata NOT DETECTED NOT DETECTED   Candida krusei NOT DETECTED NOT DETECTED  Candida parapsilosis NOT DETECTED NOT DETECTED   Candida tropicalis NOT DETECTED NOT DETECTED   Cryptococcus neoformans/gattii NOT DETECTED  NOT DETECTED   Meth resistant mecA/C and MREJ NOT DETECTED NOT DETECTED    Comment: Performed at [facility], [address], [place], [place] [address]  Blood Culture (routine x 2)     Status: None (Preliminary result)   Collection Time: 01/31/21 11:15 AM   Specimen: BLOOD  Result Value Ref Range   Specimen Description      BLOOD RIGHT ANTECUBITAL Performed at [facility], [address], [place], [place] [address]    Special Requests      BOTTLES DRAWN AEROBIC AND ANAEROBIC Blood Culture adequate volume Performed at [facility], [address], [place], [place] [address]    Culture  Setup Time      GRAM POSITIVE COCCI IN CLUSTERS IN BOTH AEROBIC AND ANAEROBIC BOTTLES CRITICAL VALUE NOTED.  VALUE IS CONSISTENT WITH PREVIOUSLY REPORTED AND CALLED VALUE. Performed at [facility], [address] [place], [place] [address]    Culture GRAM POSITIVE COCCI    Report Status PENDING   Lactic acid, plasma     Status: Abnormal   Collection Time: 01/31/21  1:22 PM  Result Value Ref Range   Lactic Acid, Venous 2.8 (HH) 0.5 - 1.9 mmol/L    Comment: CRITICAL VALUE NOTED.  VALUE IS CONSISTENT WITH PREVIOUSLY REPORTED AND CALLED VALUE. Performed at [facility], [address], [place], [place] [address]   MRSA PCR Screening     Status: None   Collection Time: 01/31/21  2:28 PM   Specimen: Nasal Mucosa; Nasopharyngeal  Result Value Ref Range   MRSA by PCR NEGATIVE NEGATIVE    Comment:        The GeneXpert MRSA Assay (FDA approved for NASAL specimens only), is one component of a comprehensive MRSA colonization surveillance program. It is not intended to diagnose MRSA infection nor to guide or monitor treatment for MRSA infections. Performed at [facility], [address], [place], [place] [address]   Renal function panel     Status: Abnormal   Collection Time: 01/31/21  3:10 PM  Result Value  Ref Range   Sodium 119 (LL) 135 - 145 mmol/L    Comment: CRITICAL RESULT CALLED TO, READ BACK BY AND VERIFIED WITH: R.MLO, RN AT 1646 ON 06.08.22 BY N.THOMPSON DELTA CHECK NOTED    Potassium 3.9 3.5 - 5.1 mmol/L   Chloride 84 (L) 98 - 111 mmol/L   CO2 18 (L) 22 - 32 mmol/L   Glucose, Bld 300 (H) 70 - 99 mg/dL    Comment: Glucose reference range applies only to samples taken after fasting for at least 8 hours.   BUN 20 6 - 20 mg/dL   Creatinine, Ser 1.95 0.61 - 1.24 mg/dL   Calcium 7.5 (L) 8.9 - 10.3 mg/dL   Phosphorus 2.8 2.5 - 4.6 mg/dL   Albumin 2.0 (L) 3.5 - 5.0 g/dL   GFR, Estimated >09 >32 mL/min    Comment: (NOTE) Calculated using the CKD-EPI Creatinine Equation (2021)    Anion gap 17 (H) 5 - 15    Comment: Performed at [facility], [address], [place], [place] [address]  Renal function panel     Status: Abnormal   Collection Time: 01/31/21  3:10 PM  Result Value Ref Range   Sodium 117 (LL) 135 - 145 mmol/L  Comment: CRITICAL RESULT CALLED TO, READ BACK BY AND VERIFIED WITH: A.HEAVNER, RN AT 1829 ON 06.08.22 BY N.THOMPSON    Potassium 3.7 3.5 - 5.1 mmol/L   Chloride 81 (L) 98 - 111 mmol/L   CO2 18 (L) 22 - 32 mmol/L   Glucose, Bld 294 (H) 70 - 99 mg/dL    Comment: Glucose reference range applies only to samples taken after fasting for at least 8 hours.   BUN 20 6 - 20 mg/dL   Creatinine, Ser 1.30 0.61 - 1.24 mg/dL   Calcium 7.4 (L) 8.9 - 10.3 mg/dL   Phosphorus 2.8 2.5 - 4.6 mg/dL   Albumin 2.1 (L) 3.5 - 5.0 g/dL   GFR, Estimated >86 >57 mL/min    Comment: (NOTE) Calculated using the CKD-EPI Creatinine Equation (2021)    Anion gap 18 (H) 5 - 15    Comment: Performed at [facility], [address], [place], [place] [address]  Lactic acid, plasma     Status: Abnormal   Collection Time: 01/31/21  3:10 PM  Result Value Ref Range   Lactic Acid, Venous 2.9 (HH) 0.5 - 1.9 mmol/L    Comment: CRITICAL VALUE NOTED.  VALUE IS  CONSISTENT WITH PREVIOUSLY REPORTED AND CALLED VALUE. Performed at [facility], [address], [place], [place] [address]   CBC     Status: Abnormal   Collection Time: 01/31/21  3:10 PM  Result Value Ref Range   WBC 23.1 (H) 4.0 - 10.5 K/uL   RBC 4.45 4.22 - 5.81 MIL/uL   Hemoglobin 13.7 13.0 - 17.0 g/dL   HCT 41.3 (L) 24.4 - 01.0 %   MCV 84.9 80.0 - 100.0 fL   MCH 30.8 26.0 - 34.0 pg   MCHC 36.2 (H) 30.0 - 36.0 g/dL   RDW 27.2 53.6 - 64.4 %   Platelets 472 (H) 150 - 400 K/uL   nRBC 0.0 0.0 - 0.2 %    Comment: Performed at [facility], [address], [place], [place] [address]  Creatinine, serum     Status: None   Collection Time: 01/31/21  3:10 PM  Result Value Ref Range   Creatinine, Ser 0.74 0.61 - 1.24 mg/dL   GFR, Estimated >25 >95 mL/min    Comment: (NOTE) Calculated using the CKD-EPI Creatinine Equation (2021) Performed at [facility], [address] [place], [place] [address]   Procalcitonin     Status: None   Collection Time: 01/31/21  3:10 PM  Result Value Ref Range   Procalcitonin 6.28 ng/mL    Comment:        Interpretation: PCT > 2 ng/mL: Systemic infection (sepsis) is likely, unless other causes are known. (NOTE)       Sepsis PCT Algorithm           Lower Respiratory Tract                                      Infection PCT Algorithm    ----------------------------     ----------------------------         PCT < 0.25 ng/mL                PCT < 0.10 ng/mL          Strongly encourage             Strongly discourage   discontinuation of  antibiotics    initiation of antibiotics    ----------------------------     -----------------------------       PCT 0.25 - 0.50 ng/mL            PCT 0.10 - 0.25 ng/mL               OR       >80% decrease in PCT            Discourage initiation of                                            antibiotics      Encourage discontinuation           of antibiotics     ----------------------------     -----------------------------         PCT >= 0.50 ng/mL              PCT 0.26 - 0.50 ng/mL               AND       <80% decrease in PCT              Encourage initiation of                                             antibiotics       Encourage continuation           of antibiotics    ----------------------------     -----------------------------        PCT >= 0.50 ng/mL                  PCT > 0.50 ng/mL               AND         increase in PCT                  Strongly encourage                                      initiation of antibiotics    Strongly encourage escalation           of antibiotics                                     -----------------------------                                           PCT <= 0.25 ng/mL                                                 OR                                        >  80% decrease in PCT                                      Discontinue / Do not initiate                                             antibiotics  Performed at [facility], [address] [place], [place] [address]   Glucose, capillary     Status: Abnormal   Collection Time: 01/31/21  3:41 PM  Result Value Ref Range   Glucose-Capillary 299 (H) 70 - 99 mg/dL    Comment: Glucose reference range applies only to samples taken after fasting for at least 8 hours.   Comment 1 Notify RN    Comment 2 Document in Chart   Glucose, capillary     Status: Abnormal   Collection Time: 01/31/21  7:29 PM  Result Value Ref Range   Glucose-Capillary 325 (H) 70 - 99 mg/dL    Comment: Glucose reference range applies only to samples taken after fasting for at least 8 hours.  Glucose, capillary     Status: Abnormal   Collection Time: 01/31/21  9:48 PM  Result Value Ref Range   Glucose-Capillary 310 (H) 70 - 99 mg/dL    Comment: Glucose reference range applies only to samples taken after fasting for at least 8 hours.  Renal function panel      Status: Abnormal   Collection Time: 02/01/21 12:22 AM  Result Value Ref Range   Sodium 119 (LL) 135 - 145 mmol/L    Comment: CRITICAL RESULT CALLED TO, READ BACK BY AND VERIFIED WITH: Risa Grill @ 0051 ON 02/01/21 C VARNER    Potassium 3.6 3.5 - 5.1 mmol/L   Chloride 83 (L) 98 - 111 mmol/L   CO2 24 22 - 32 mmol/L   Glucose, Bld 263 (H) 70 - 99 mg/dL    Comment: Glucose reference range applies only to samples taken after fasting for at least 8 hours.   BUN 17 6 - 20 mg/dL   Creatinine, Ser 7.12 0.61 - 1.24 mg/dL   Calcium 7.4 (L) 8.9 - 10.3 mg/dL   Phosphorus 2.6 2.5 - 4.6 mg/dL   Albumin 1.9 (L) 3.5 - 5.0 g/dL   GFR, Estimated >45 >80 mL/min    Comment: (NOTE) Calculated using the CKD-EPI Creatinine Equation (2021)    Anion gap 12 5 - 15    Comment: Performed at [facility], [address], [place], [place] [address]  Protime-INR     Status: Abnormal   Collection Time: 02/01/21 12:22 AM  Result Value Ref Range   Prothrombin Time 16.3 (H) 11.4 - 15.2 seconds   INR 1.3 (H) 0.8 - 1.2    Comment: (NOTE) INR goal varies based on device and disease states. Performed at [facility], [address], [place], [place] [address]   Cortisol-am, blood     Status: Abnormal   Collection Time: 02/01/21 12:22 AM  Result Value Ref Range   Cortisol - AM 34.9 (H) 6.7 - 22.6 ug/dL    Comment: Performed at [facility], [address] [place], [place] [address]  CBC     Status: Abnormal   Collection Time: 02/01/21 12:22 AM  Result  Value Ref Range   WBC 19.5 (H) 4.0 - 10.5 K/uL   RBC 4.25 4.22 - 5.81 MIL/uL   Hemoglobin 13.2 13.0 - 17.0 g/dL   HCT 16.1 (L) 09.6 - 04.5 %   MCV 83.5 80.0 - 100.0 fL   MCH 31.1 26.0 - 34.0 pg   MCHC 37.2 (H) 30.0 - 36.0 g/dL   RDW 40.9 81.1 - 91.4 %   Platelets 393 150 - 400 K/uL   nRBC 0.0 0.0 - 0.2 %    Comment: Performed at [facility], [address] [place], [place] [address]  Renal  function panel     Status: Abnormal   Collection Time: 02/01/21  6:37 AM  Result Value Ref Range   Sodium 120 (L) 135 - 145 mmol/L   Potassium 3.5 3.5 - 5.1 mmol/L   Chloride 85 (L) 98 - 111 mmol/L   CO2 23 22 - 32 mmol/L   Glucose, Bld 266 (H) 70 - 99 mg/dL    Comment: Glucose reference range applies only to samples taken after fasting for at least 8 hours.   BUN 18 6 - 20 mg/dL   Creatinine, Ser 6.21 0.61 - 1.24 mg/dL   Calcium 7.3 (L) 8.9 - 10.3 mg/dL   Phosphorus 2.5 2.5 - 4.6 mg/dL   Albumin 1.8 (L) 3.5 - 5.0 g/dL   GFR, Estimated >30 >86 mL/min    Comment: (NOTE) Calculated using the CKD-EPI Creatinine Equation (2021)    Anion gap 12 5 - 15    Comment: Performed at [facility], [address], [place], [place] [address]  Glucose, capillary     Status: Abnormal   Collection Time: 02/01/21  7:30 AM  Result Value Ref Range   Glucose-Capillary 280 (H) 70 - 99 mg/dL    Comment: Glucose reference range applies only to samples taken after fasting for at least 8 hours.   Comment 1 Notify RN    Comment 2 Document in Chart   Glucose, capillary     Status: Abnormal   Collection Time: 02/01/21 11:53 AM  Result Value Ref Range   Glucose-Capillary 204 (H) 70 - 99 mg/dL    Comment: Glucose reference range applies only to samples taken after fasting for at least 8 hours.   Comment 1 Notify RN    Comment 2 Document in Chart   Renal function panel     Status: Abnormal   Collection Time: 02/01/21  2:20 PM  Result Value Ref Range   Sodium 117 (LL) 135 - 145 mmol/L    Comment: CRITICAL RESULT CALLED TO, READ BACK BY AND VERIFIED WITH: K.GARCIA, RN AT 1624 ON 06.09.22 BY N.THOMPSON    Potassium 3.5 3.5 - 5.1 mmol/L   Chloride 83 (L) 98 - 111 mmol/L   CO2 23 22 - 32 mmol/L   Glucose, Bld 212 (H) 70 - 99 mg/dL    Comment: Glucose reference range applies only to samples taken after fasting for at least 8 hours.   BUN 18 6 - 20 mg/dL   Creatinine, Ser 9.62 (L) 0.61 -  1.24 mg/dL   Calcium 7.3 (L) 8.9 - 10.3 mg/dL   Phosphorus 1.4 (L) 2.5 - 4.6 mg/dL   Albumin 2.0 (L) 3.5 - 5.0 g/dL   GFR, Estimated >95 >28 mL/min    Comment: (NOTE) Calculated using the CKD-EPI Creatinine Equation (2021)    Anion gap 11 5 - 15    Comment: Performed at [facility],  [address] [place], [place] [address]  Glucose, capillary     Status: Abnormal   Collection Time: 02/01/21  4:06 PM  Result Value Ref Range   Glucose-Capillary 173 (H) 70 - 99 mg/dL    Comment: Glucose reference range applies only to samples taken after fasting for at least 8 hours.   Comment 1 Notify RN    Comment 2 Document in Chart     MICRO:  IMAGING: MR THORACIC SPINE W WO CONTRAST  Result Date: 02/01/2021 CLINICAL DATA:  Thoracic region back pain. Recent fall. Question epidural abscess. Bacteremia. EXAM: MRI THORACIC WITHOUT AND WITH CONTRAST TECHNIQUE: Multiplanar and multiecho pulse sequences of the thoracic spine were obtained without and with intravenous contrast. CONTRAST:  9mL GADAVIST GADOBUTROL 1 MMOL/ML IV SOLN COMPARISON:  None. FINDINGS: Alignment:  No abnormal alignment. Vertebrae: In general, the vertebral bodies show loss of height from T5 through T12 is could possibly be seen with chronic osteopenia. No evidence of a focal or recent compression fracture. No sign of bone infection. Cord:  See below regarding stenosis. Paraspinal and other soft tissues: Extensive T2 bright material in the left upper chest wall in the supraclavicular region and between the ribcage and the scapula, with peripheral enhancement. This could be sterile material related to hematoma or could be an infection, either a primary infection or a superinfected hematoma. Consider chest CT for complete evaluation. Disc levels: T1-2 and T2-3: Normal T3-4: Small left posterolateral disc protrusion but without apparent neural compression. T4-5: Congenital spinal stenosis. Chronic appearing central disc  herniation. Facet and ligamentous hypertrophy. Spinal stenosis with some cord deformity. T5-6: Congenital spinal stenosis. Chronic appearing broad-based disc herniation more prominent towards the right. Facet and ligamentous hypertrophy. Severe spinal stenosis with cord deformity. T6-7: Congenital spinal stenosis. Chronic appearing broad-based disc herniation. Facet and ligamentous hypertrophy. No visible compressive effect upon the cord. T7-8: Congenital spinal stenosis. Chronic appearing broad-based disc herniation more prominent towards the left. Facet and ligamentous hypertrophy. Some deformity of the ventral cord. T8-9: Congenital spinal stenosis. Broad-based disc herniation which could be relatively more recent. Facet and ligamentous prominence. Slight: Indentation of cord. T9-10 congenital spinal stenosis. Chronic appearing left posterolateral disc herniation with mild mass-effect upon the left side of the cord. T10-11: Congenital spinal stenosis. Chronic appearing shallow protrusion of the disc. Facet and ligamentous hypertrophy. Mild deformity of the cord. T11-12: Congenital spinal stenosis. Chronic appearing disc protrusion more prominent towards the right. Facet and ligamentous prominence. Some deformity of the cord. T12-L1: Chronic appearing bilateral posterolateral disc herniations that indent the thecal sac. No distinct neural compression. IMPRESSION: Fluid collection in the left upper posterior chest wall as outlined above that could be sterile hematoma, infected hematoma or abscess on related to hematoma. Consider chest CT with contrast for better evaluation of extent. 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 Stenosis with some cord deformity which could be significant in the region from T4-5 through T11-12 as outlined above. No evidence infection involving the spine or spinal canal. Electronically  Signed   By: Paulina Fusi M.D.   On: 02/01/2021 14:20   DG Chest Port 1 View  Result Date: 01/31/2021 CLINICAL DATA:  Weakness. EXAM: PORTABLE CHEST 1 VIEW COMPARISON:  None. FINDINGS: Cardiomediastinal silhouette is normal. Mediastinal contours appear intact. There is no evidence of focal airspace consolidation, pleural effusion or pneumothorax. Osseous structures are without acute abnormality. Soft tissues are grossly normal. IMPRESSION: No active disease. Electronically Signed  By: Ted Mcalpine M.D.   On: 01/31/2021 09:34     Assessment/Plan:  54yo M with severe sepsis due to MSSA bacteremia, hyponatremia and confusion  - narrow abtx to cefazolin 2gm IV Q8hr - will need TEE to exclude endocarditis - recommend Chest CT to evaluate if he has large hematoma possibly infected from recent injury - repeat blood cx tomorrow - recent covid illness may have placed him at risk for bacterial infection.  Hyponatremia = suspect poor po intake, some correction for hyperglycemia. Correct slowly; likely contributing to confusion  Leukocytosis = anticipate to improve as he has more abtx in his system

## 2021-02-02 ENCOUNTER — Inpatient Hospital Stay (HOSPITAL_COMMUNITY): Payer: Commercial Managed Care - PPO

## 2021-02-02 DIAGNOSIS — E1110 Type 2 diabetes mellitus with ketoacidosis without coma: Secondary | ICD-10-CM

## 2021-02-02 DIAGNOSIS — A419 Sepsis, unspecified organism: Secondary | ICD-10-CM | POA: Diagnosis not present

## 2021-02-02 DIAGNOSIS — R6520 Severe sepsis without septic shock: Secondary | ICD-10-CM | POA: Diagnosis not present

## 2021-02-02 DIAGNOSIS — B9561 Methicillin susceptible Staphylococcus aureus infection as the cause of diseases classified elsewhere: Secondary | ICD-10-CM | POA: Diagnosis not present

## 2021-02-02 DIAGNOSIS — R7881 Bacteremia: Secondary | ICD-10-CM | POA: Diagnosis not present

## 2021-02-02 HISTORY — PX: IR US GUIDE BX ASP/DRAIN: IMG2392

## 2021-02-02 LAB — GLUCOSE, CAPILLARY
Glucose-Capillary: 226 mg/dL — ABNORMAL HIGH (ref 70–99)
Glucose-Capillary: 190 mg/dL — ABNORMAL HIGH (ref 70–99)
Glucose-Capillary: 195 mg/dL — ABNORMAL HIGH (ref 70–99)
Glucose-Capillary: 194 mg/dL — ABNORMAL HIGH (ref 70–99)

## 2021-02-02 LAB — CBC WITH DIFFERENTIAL/PLATELET
Abs Immature Granulocytes: 0.43 10*3/uL — ABNORMAL HIGH (ref 0.00–0.07)
Basophils Absolute: 0 10*3/uL (ref 0.0–0.1)
Basophils Relative: 0 %
Eosinophils Absolute: 0.1 10*3/uL (ref 0.0–0.5)
Eosinophils Relative: 1 %
HCT: 33.7 % — ABNORMAL LOW (ref 39.0–52.0)
Hemoglobin: 12.5 g/dL — ABNORMAL LOW (ref 13.0–17.0)
Immature Granulocytes: 3 %
Lymphocytes Relative: 5 %
Lymphs Abs: 0.7 10*3/uL (ref 0.7–4.0)
MCH: 31.3 pg (ref 26.0–34.0)
MCHC: 37.1 g/dL — ABNORMAL HIGH (ref 30.0–36.0)
MCV: 84.3 fL (ref 80.0–100.0)
Monocytes Absolute: 0.4 10*3/uL (ref 0.1–1.0)
Monocytes Relative: 3 %
Neutro Abs: 11.9 10*3/uL — ABNORMAL HIGH (ref 1.7–7.7)
Neutrophils Relative %: 88 %
Platelets: 390 10*3/uL (ref 150–400)
RBC: 4 MIL/uL — ABNORMAL LOW (ref 4.22–5.81)
RDW: 13.2 % (ref 11.5–15.5)
WBC: 13.6 10*3/uL — ABNORMAL HIGH (ref 4.0–10.5)
nRBC: 0 % (ref 0.0–0.2)

## 2021-02-02 LAB — BASIC METABOLIC PANEL
Anion gap: 10 (ref 5–15)
Anion gap: 12 (ref 5–15)
Anion gap: 13 (ref 5–15)
Anion gap: 12 (ref 5–15)
BUN: 17 mg/dL (ref 6–20)
BUN: 16 mg/dL (ref 6–20)
BUN: 16 mg/dL (ref 6–20)
BUN: 17 mg/dL (ref 6–20)
CO2: 23 mmol/L (ref 22–32)
CO2: 23 mmol/L (ref 22–32)
CO2: 23 mmol/L (ref 22–32)
CO2: 24 mmol/L (ref 22–32)
Calcium: 7.3 mg/dL — ABNORMAL LOW (ref 8.9–10.3)
Calcium: 7.4 mg/dL — ABNORMAL LOW (ref 8.9–10.3)
Calcium: 7.8 mg/dL — ABNORMAL LOW (ref 8.9–10.3)
Calcium: 8 mg/dL — ABNORMAL LOW (ref 8.9–10.3)
Chloride: 85 mmol/L — ABNORMAL LOW (ref 98–111)
Chloride: 87 mmol/L — ABNORMAL LOW (ref 98–111)
Chloride: 89 mmol/L — ABNORMAL LOW (ref 98–111)
Chloride: 90 mmol/L — ABNORMAL LOW (ref 98–111)
Creatinine, Ser: 0.53 mg/dL — ABNORMAL LOW (ref 0.61–1.24)
Creatinine, Ser: 0.45 mg/dL — ABNORMAL LOW (ref 0.61–1.24)
Creatinine, Ser: 0.64 mg/dL (ref 0.61–1.24)
Creatinine, Ser: 0.54 mg/dL — ABNORMAL LOW (ref 0.61–1.24)
GFR, Estimated: >60 mL/min (ref >60)
GFR, Estimated: >60 mL/min (ref >60)
GFR, Estimated: >60 mL/min (ref >60)
GFR, Estimated: >60 mL/min (ref >60)
Glucose, Bld: 188 mg/dL — ABNORMAL HIGH (ref 70–99)
Glucose, Bld: 193 mg/dL — ABNORMAL HIGH (ref 70–99)
Glucose, Bld: 201 mg/dL — ABNORMAL HIGH (ref 70–99)
Glucose, Bld: 186 mg/dL — ABNORMAL HIGH (ref 70–99)
Potassium: 3.9 mmol/L (ref 3.5–5.1)
Potassium: 3.4 mmol/L — ABNORMAL LOW (ref 3.5–5.1)
Potassium: 3.6 mmol/L (ref 3.5–5.1)
Potassium: 3.6 mmol/L (ref 3.5–5.1)
Sodium: 118 mmol/L — CL (ref 135–145)
Sodium: 122 mmol/L — ABNORMAL LOW (ref 135–145)
Sodium: 125 mmol/L — ABNORMAL LOW (ref 135–145)
Sodium: 126 mmol/L — ABNORMAL LOW (ref 135–145)

## 2021-02-02 LAB — HEPATIC FUNCTION PANEL
ALT: 39 U/L (ref 0–44)
AST: 71 U/L — ABNORMAL HIGH (ref 15–41)
Albumin: 2.1 g/dL — ABNORMAL LOW (ref 3.5–5.0)
Alkaline Phosphatase: 106 U/L (ref 38–126)
Bilirubin, Direct: 0.5 mg/dL — ABNORMAL HIGH (ref 0.0–0.2)
Indirect Bilirubin: 0.6 mg/dL (ref 0.3–0.9)
Total Bilirubin: 1.1 mg/dL (ref 0.3–1.2)
Total Protein: 6 g/dL — ABNORMAL LOW (ref 6.5–8.1)

## 2021-02-02 LAB — PHOSPHORUS: Phosphorus: 2.9 mg/dL (ref 2.5–4.6)

## 2021-02-02 LAB — SODIUM, URINE, RANDOM
Sodium, Ur: <10 mmol/L
Sodium, Ur: <10 mmol/L

## 2021-02-02 LAB — URINE CULTURE: Culture: >=100000 — AB

## 2021-02-02 LAB — MAGNESIUM: Magnesium: 2.2 mg/dL (ref 1.7–2.4)

## 2021-02-02 MED ORDER — ACETAMINOPHEN 500 MG PO TABS
500.0000 mg | ORAL_TABLET | ORAL | Status: DC | PRN
  Administered 2021-02-06 – 2021-02-15 (×14): 500 mg via ORAL
  Filled 2021-02-02 (×14): qty 1

## 2021-02-02 MED ORDER — MIDAZOLAM HCL 2 MG/2ML IJ SOLN
INTRAMUSCULAR | Status: AC | PRN
  Administered 2021-02-02 (×2): 1 mg via INTRAVENOUS
  Administered 2021-02-02: 0.5 mg via INTRAVENOUS
  Administered 2021-02-02: 1 mg via INTRAVENOUS

## 2021-02-02 MED ORDER — MORPHINE SULFATE (PF) 2 MG/ML IV SOLN
2.0000 mg | INTRAVENOUS | Status: DC | PRN
  Administered 2021-02-03 – 2021-02-11 (×20): 2 mg via INTRAVENOUS
  Filled 2021-02-02 (×22): qty 1

## 2021-02-02 MED ORDER — FENTANYL CITRATE (PF) 100 MCG/2ML IJ SOLN
INTRAMUSCULAR | Status: AC | PRN
  Administered 2021-02-02 (×2): 50 ug via INTRAVENOUS

## 2021-02-02 MED ORDER — LIDOCAINE HCL 1 % IJ SOLN
INTRAMUSCULAR | Status: AC
  Filled 2021-02-02: qty 20

## 2021-02-02 MED ORDER — MIDAZOLAM HCL 2 MG/2ML IJ SOLN
INTRAMUSCULAR | Status: AC
  Filled 2021-02-02: qty 4

## 2021-02-02 MED ORDER — FENTANYL CITRATE (PF) 100 MCG/2ML IJ SOLN
INTRAMUSCULAR | Status: AC
  Filled 2021-02-02: qty 2

## 2021-02-02 MED ORDER — LIDOCAINE HCL 1 % IJ SOLN
INTRAMUSCULAR | Status: AC | PRN
  Administered 2021-02-02: 10 mL via INTRADERMAL

## 2021-02-02 NOTE — Progress Notes (Signed)
Physical Therapy Treatment Patient Details Name: John Ware MRN: [id number] DOB: 07-Aug-1966 Today's Date: 02/02/2021    History of Present Illness Patient is a 55 y.o. male presented to ED with weakness and per wife generally unwell over the past 3 weeks. He had a positive home COVID test on 01/12/21. He had no respiratory symptoms at the time. He just had fevers that were responsive to APAP. He self isolated at home until 01/20/21 when he was at a party and experienced a fall. Pt had pain in Lt shoulder, no acute injury seen on x-ray. PMH significant of chronic pain, HTN, DM2, depression, spinal stenosis.    PT Comments    Patient is making good progress with mobility. He remains limited by Lt shoulder pain but was able to initiate LE mobility to move to EOB. Mod assist required to raise trunk up to EOB and pt able to steady self in sitting. Patient was able to initiated power up with [facility] and min assist to initiate then mod assist to stabilize due to posterior lean. HR stable today in 100-110's and BP stable. Pt able to take several steps to move to recliner and then short forward/backward steps with Mod Assist +2 for gait training. He will benefit from trail of RW next visit to advance gait distance. Acute PT will progress as able. Pt remains excellent candidate for CIR rehab and is highly motivated.    Follow Up Recommendations  CIR     Equipment Recommendations  None recommended by PT (TBD)    Recommendations for Other Services Rehab consult     Precautions / Restrictions Precautions Precautions: Fall Restrictions Weight Bearing Restrictions: No    Mobility  Bed Mobility Overal bed mobility: Needs Assistance Bed Mobility: Supine to Sit     Supine to sit: Min assist;Mod assist;HOB elevated     General bed mobility comments: pt requries increased time to initiate LE mobility to EOB. Min assist for LE's and Mod assist to raise trunk fully due to Lt shoulder pain  limitations.    Transfers Overall transfer level: Needs assistance Equipment used: 2 person hand held assist Transfers: Sit to/from [employer] Sit to Stand: Min assist;Mod assist;+2 physical assistance;+2 safety/equipment Stand pivot transfers: Min assist;Mod assist;+2 physical assistance;+2 safety/equipment       General transfer comment: 2HHA with min assist to initiate power up and Mod assist to steady in standing and facilitate anterior weight shift for improved posture.  Ambulation/Gait Ambulation/Gait assistance: Min assist;Mod assist;+2 physical assistance;+2 safety/equipment Gait Distance (Feet): 6 Feet Assistive device: 2 person hand held assist Gait Pattern/deviations: Step-through pattern;Decreased step length - right;Decreased step length - left;Decreased stride length;Shuffle;Narrow base of support Gait velocity: decr   General Gait Details: Min-Mod assist for balance and to steady. cues for step sequence and to initiate forward/backward gait from recliner. HR stable in 100's-110's this session.   Stairs             Wheelchair Mobility    Modified Rankin (Stroke Patients Only)       Balance Overall balance assessment: Needs assistance Sitting-balance support: Feet supported;Bilateral upper extremity supported Sitting balance-[name]: Fair     Standing balance support: During functional activity;Bilateral upper extremity supported Standing balance-[name]: Poor                              Cognition Arousal/Alertness: Awake/alert Behavior During Therapy: WFL for tasks assessed/performed Overall Cognitive Status:  No family/caregiver present to determine baseline cognitive functioning Area of Impairment: Attention;Memory;Following commands;Awareness;Problem solving                   Current Attention Level: Sustained Memory: Decreased recall of precautions;Decreased short-term memory Following Commands:  Follows one step commands with increased time;Follows multi-step commands with increased time;Follows multi-step commands inconsistently   Awareness: Emergent Problem Solving: Slow processing;Decreased initiation;Requires verbal cues General Comments: pt limited by pain and lethargy.      Exercises      General Comments        Pertinent Vitals/Pain Pain Assessment: Faces Faces Pain Scale: Hurts little more Pain Location: general discomfort throughout body Pain Descriptors / Indicators: Aching;Discomfort;Grimacing;Guarding Pain Intervention(s): Limited activity within patient's tolerance;Monitored during session;Repositioned;Premedicated before session    Home Living                      Prior Function            PT Goals (current goals can now be found in the care plan section) Acute Rehab PT Goals Patient Stated Goal: to regain strength PT Goal Formulation: With patient Time For Goal Achievement: 02/15/21 Potential to Achieve Goals: Good Progress towards PT goals: Progressing toward goals    Frequency    Min 4X/week      PT Plan Current plan remains appropriate    Co-evaluation              AM-PAC PT "6 Clicks" Mobility   Outcome Measure  Help needed turning from your back to your side while in a flat bed without using bedrails?: A Little Help needed moving from lying on your back to sitting on the side of a flat bed without using bedrails?: A Lot Help needed moving to and from a bed to a chair (including a wheelchair)?: A Lot Help needed standing up from a chair using your arms (e.g., wheelchair or bedside chair)?: A Lot Help needed to walk in hospital room?: A Lot Help needed climbing 3-5 steps with a railing? : A Lot 6 Click Score: 13    End of Session Equipment Utilized During Treatment: Gait belt Activity Tolerance: Patient tolerated treatment well;Patient limited by fatigue Patient left: with call bell/phone within reach;in chair;with  chair alarm set Nurse Communication: Mobility status PT Visit Diagnosis: Muscle weakness (generalized) (M62.81);Unsteadiness on feet (R26.81);History of falling (Z91.81);Difficulty in walking, not elsewhere classified (R26.2)     Time: 8309-4076 PT Time Calculation (min) (ACUTE ONLY): 24 min  Charges:  $Gait Training: 8-22 mins $Therapeutic Activity: 8-22 mins                     Wynn Maudlin, DPT Acute Rehabilitation Services Office [phone] Pager [phone]    Anitra Lauth 02/02/2021, 12:54 PM

## 2021-02-02 NOTE — Progress Notes (Signed)
[facility]    Date of Admission:  01/31/2021   Total days of antibiotics 3 cefazolin           ID: John Ware is a 55 y.o. male with   Principal Problem:   Severe sepsis (HCC) Active Problems:   Diabetes mellitus (HCC)   Hyponatremia   Pressure injury of skin   Bacteremia due to Staphylococcus aureus   HTN (hypertension)   Anxiety   Left shoulder pain    Subjective: Afebrile this morning. Underwent chest CT concerning for thoracic abscess and pulmonary septic emboli  Medications:   Chlorhexidine Gluconate Cloth  6 each Topical Daily   enoxaparin (LOVENOX) injection  40 mg Subcutaneous Q24H   gabapentin  800 mg Oral TID   insulin aspart  0-15 Units Subcutaneous TID WC   insulin aspart  0-5 Units Subcutaneous QHS   insulin glargine  10 Units Subcutaneous Daily   mouth rinse  15 mL Mouth Rinse BID   oxyCODONE  20 mg Oral Q12H    Objective: Vital signs in last 24 hours: Temp:  [97.6 F (36.4 C)-98.3 F (36.8 C)] 98.3 F (36.8 C) (06/10 1147) Pulse Rate:  [73-107] 90 (06/10 1610) Resp:  [12-27] 24 (06/10 1610) BP: (90-134)/(50-82) 109/79 (06/10 1610) SpO2:  [93 %-100 %] 95 % (06/10 1610) Physical Exam  Constitutional: He is oriented to person, place, and time. He appears well-developed and well-nourished. No distress.  HENT:  Mouth/Throat: Oropharynx is clear and moist. No oropharyngeal exudate.  Cardiovascular: Normal rate, regular rhythm and normal heart sounds. Exam reveals no gallop and no friction rub.  No murmur heard.  Pulmonary/Chest: Effort normal and breath sounds normal. No respiratory distress. He has no wheezes.  Abdominal: Soft. Bowel sounds are normal. He exhibits no distension. There is no tenderness.  Lymphadenopathy:  He has no cervical adenopathy.  Neurological: He is alert and oriented to person, place, and time.  Skin: Skin is warm and dry. No rash noted. No erythema.  Psychiatric: He has a normal mood and  affect. His behavior is normal.    Lab Results Recent Labs    02/01/21 0022 02/01/21 0637 02/02/21 0717 02/02/21 1211  WBC 19.5*  --  13.6*  --   HGB 13.2  --  12.5*  --   HCT 35.5*  --  33.7*  --   NA 119*   < > 122* 125*  K 3.6   < > 3.4* 3.6  CL 83*   < > 87* 89*  CO2 24   < > 23 23  BUN 17   < > 16 16  CREATININE 0.70   < > 0.45* 0.64   < > = values in this interval not displayed.   Liver Panel Recent Labs    01/31/21 0854 01/31/21 1510 02/01/21 1743 02/02/21 1211  PROT 6.2*  --   --  6.0*  ALBUMIN 2.4*   < > 2.0* 2.1*  AST 70*  --   --  71*  ALT 53*  --   --  39  ALKPHOS 88  --   --  106  BILITOT 1.5*  --   --  1.1  BILIDIR  --   --   --  0.5*  IBILI  --   --   --  0.6   < > = values in this interval not displayed.   Sedimentation Rate No results for input(s): ESRSEDRATE in the last 72 hours.  C-Reactive Protein No results for input(s): CRP in the last 72 hours.  Microbiology: reviewed Studies/Results: DG Shoulder Right  Result Date: 02/01/2021 CLINICAL DATA:  Bilateral shoulder pain without known injury. EXAM: RIGHT SHOULDER - 2+ VIEW COMPARISON:  None. FINDINGS: There is no evidence of fracture or dislocation. There is no evidence of arthropathy or other focal bone abnormality. Soft tissues are unremarkable. IMPRESSION: Negative. Electronically Signed   By: Lupita Raider M.D.   On: 02/01/2021 16:44   MR THORACIC SPINE W WO CONTRAST  Result Date: 02/01/2021 CLINICAL DATA:  Thoracic region back pain. Recent fall. Question epidural abscess. Bacteremia. EXAM: MRI THORACIC WITHOUT AND WITH CONTRAST TECHNIQUE: Multiplanar and multiecho pulse sequences of the thoracic spine were obtained without and with intravenous contrast. CONTRAST:  82mL GADAVIST GADOBUTROL 1 MMOL/ML IV SOLN COMPARISON:  None. FINDINGS: Alignment:  No abnormal alignment. Vertebrae: In general, the vertebral bodies show loss of height from T5 through T12 is could possibly be seen with chronic  osteopenia. No evidence of a focal or recent compression fracture. No sign of bone infection. Cord:  See below regarding stenosis. Paraspinal and other soft tissues: Extensive T2 bright material in the left upper chest wall in the supraclavicular region and between the ribcage and the scapula, with peripheral enhancement. This could be sterile material related to hematoma or could be an infection, either a primary infection or a superinfected hematoma. Consider chest CT for complete evaluation. Disc levels: T1-2 and T2-3: Normal T3-4: Small left posterolateral disc protrusion but without apparent neural compression. T4-5: Congenital spinal stenosis. Chronic appearing central disc herniation. Facet and ligamentous hypertrophy. Spinal stenosis with some cord deformity. T5-6: Congenital spinal stenosis. Chronic appearing broad-based disc herniation more prominent towards the right. Facet and ligamentous hypertrophy. Severe spinal stenosis with cord deformity. T6-7: Congenital spinal stenosis. Chronic appearing broad-based disc herniation. Facet and ligamentous hypertrophy. No visible compressive effect upon the cord. T7-8: Congenital spinal stenosis. Chronic appearing broad-based disc herniation more prominent towards the left. Facet and ligamentous hypertrophy. Some deformity of the ventral cord. T8-9: Congenital spinal stenosis. Broad-based disc herniation which could be relatively more recent. Facet and ligamentous prominence. Slight: Indentation of cord. T9-10 congenital spinal stenosis. Chronic appearing left posterolateral disc herniation with mild mass-effect upon the left side of the cord. T10-11: Congenital spinal stenosis. Chronic appearing shallow protrusion of the disc. Facet and ligamentous hypertrophy. Mild deformity of the cord. T11-12: Congenital spinal stenosis. Chronic appearing disc protrusion more prominent towards the right. Facet and ligamentous prominence. Some deformity of the cord. T12-L1:  Chronic appearing bilateral posterolateral disc herniations that indent the thecal sac. No distinct neural compression. IMPRESSION: Fluid collection in the left upper posterior chest wall as outlined above that could be sterile hematoma, infected hematoma or abscess on related to hematoma. Consider chest CT with contrast for better evaluation of extent. 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 Stenosis with some cord deformity which could be significant in the region from T4-5 through T11-12 as outlined above. No evidence infection involving the spine or spinal canal. Electronically Signed   By: Paulina Fusi M.D.   On: 02/01/2021 14:20   CT CHEST ABDOMEN PELVIS W CONTRAST  Result Date: 02/01/2021 CLINICAL DATA:  Fall, left chest and abdominal pain, midthoracic spine tenderness to palpation. Gram-positive bacteremia. EXAM: CT CHEST, ABDOMEN, AND PELVIS WITH CONTRAST TECHNIQUE: Multidetector CT imaging of the chest, abdomen and pelvis was performed following the standard protocol during bolus administration  of intravenous contrast. CONTRAST:  OMNIPAQUE IOHEXOL 300 MG/ML  SOLN COMPARISON:  None. FINDINGS: CT CHEST FINDINGS Cardiovascular: No significant coronary artery calcification. Global cardiac size within normal limits. No pericardial effusion. Central pulmonary arteries are of normal caliber. Thoracic aorta is unremarkable. Mediastinum/Nodes: The thyroid gland is unremarkable. No pathologic thoracic adenopathy. The esophagus is unremarkable. Lungs/Pleura: A 7 mm peribronchial cavitary nodule is seen within the inferior lingula at axial image # 93/7 and additional 10 mm similar cavitary nodule is seen within the basilar lingula at axial image # 101/7. A similar tiny cavitary nodule is seen within the right mid lung zone at axial image # 77/7 in a centrilobular location. A 7 mm noncalcified pulmonary  nodule may demonstrate central umbilication suggesting early cavitation, best seen on coronal image # 36/5 at axial image # 72/7. Among the differential considerations, these most likely represent septic emboli given their random distribution and additional findings listed below. No pneumothorax or pleural effusion. The central airways are widely patent. Musculoskeletal: No acute bone abnormality. No lytic or blastic bone lesions. No focal osseous erosions. The osseous structures are age-appropriate. There is a multiloculated intramuscular fluid collection identified within the a subscapular posterior left chest wall measuring at least 5.5 x 7.3 x 10.4 cm in size on axial image # 11/2 and sagittal reformat # 164/6 which extends into the suprascapular region superiorly. The inflammatory changes adjacent to this collection extend inferiorly to involve the left latissimus dorsi musculature where multiple punctate foci of gas are identified. Together, the findings are suspicious for a a intramuscular abscess and/or an infected hematoma. There is soft tissue infiltration within the left chest wall laterally which appears separate from this collection which may represent edema related to trauma in this acutely traumatized patient or a separate focus of inflammatory change. CT ABDOMEN PELVIS FINDINGS Hepatobiliary: No focal liver abnormality is seen. No gallstones, gallbladder wall thickening, or biliary dilatation. Pancreas: Unremarkable Spleen: Unremarkable Adrenals/Urinary Tract: Adrenal glands are unremarkable. Kidneys are normal, without renal calculi, focal lesion, or hydronephrosis. Bladder is unremarkable. Stomach/Bowel: Stomach is within normal limits. Appendix appears normal. No evidence of bowel wall thickening, distention, or inflammatory changes. No free intraperitoneal gas or fluid. Vascular/Lymphatic: Minimal atherosclerotic calcification within the abdominal aorta. No aortic aneurysm. No pathologic  adenopathy within the abdomen and pelvis. Reproductive: Prostate is unremarkable. Other: Tiny fat containing umbilical hernia. There is diffuse subcutaneous edema involving the body wall and flanks bilaterally in keeping with mild anasarca. Musculoskeletal: No acute bone abnormality within the abdomen and pelvis. No lytic or blastic bone lesion. L3-S1 lumbar fusion with instrumentation and resection of the spinous processes of L4 and L5 has been performed. No loculated fluid collection is clearly identified within this region. IMPRESSION: Multiloculated fluid collection within the subscapular left posterior chest wall extending superiorly into the suprascapular region and medially just lateral to the spinous processes of T1-3. This appears contiguous with inflammatory changes involving the latissimus dorsi muscle where extensive foci of gas are identified and is suspicious for a a intramuscular abscess and/or infected hematoma. Fluid components superiorly within the suprascapular region or paraspinal region should be amenable to ultrasound-guided sampling for microbiology evaluation. Multiple small cavitary nodules within the mid and lower lung zones randomly distributed. Among the differential considerations, which include cystic metastases and Langerhans cell histiocytosis, the associated findings listed above favor septic embolization as an etiology. These can be reassessed following completion of antibiotic therapy. Separate focus of inflammatory change within the left axilla possibly representing  a focus of edema or hemorrhage related to trauma in this acutely traumatized patient. Alternatively, this may simply represent extension of the inflammatory process listed above. Electronically Signed   By: Helyn Numbers MD   On: 02/01/2021 20:00   DG Shoulder Left  Result Date: 02/01/2021 CLINICAL DATA:  Bilateral shoulder pain without known injury. EXAM: LEFT SHOULDER - 2+ VIEW COMPARISON:  None. FINDINGS: There  is no evidence of fracture or dislocation. There is no evidence of arthropathy or other focal bone abnormality. Soft tissues are unremarkable. IMPRESSION: Negative. Electronically Signed   By: Lupita Raider M.D.   On: 02/01/2021 16:43     Assessment/Plan: Pulmonary septic emboli = continue on cefazolin. 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  Leukocytosis = improving while on abtx  Hyponatremia = also improving. Steadily correcting. Confusion improving as well  Will follow up on monday  [facility] Cell: [phone] Pager: [phone]  02/02/2021, 4:46 PM

## 2021-02-02 NOTE — Progress Notes (Signed)
Progress Note    John Ware   WUJ:[id number]  DOB: 19-Aug-1966  DOA: 01/31/2021     2  PCP: Junie Spencer, FNP  CC: Weakness, ongoing pain  Hospital Course: John Ware is a 55 y.o. male with medical history significant of chronic pain, HTN, DM2. Presenting with weakness. History is from wife as the patient is a poor historian.  She reports that the patient has been generally unwell over the past 3 weeks or so. He had a positive home COVID test on 01/12/21. He had no respiratory symptoms at the time. He just had fevers that were responsive to APAP. He self isolated at home until 01/20/21 when he was at a party. He apparently fell out of a chair and hurt his left shoulder. He was evaluated in the ED and they found no fracture. He was given a sling and told to follow up with orthopedics.  Since that time, the wife reports that he's had general body aches/pain that have increased and are not responsive to his regular chronic pain meds. He has had a generally poor appetite during this time. She reports that he's been unable to walk for the last 2 days. These constellation of symptoms were concerning, so she brought him to the ED.    He was found to have a sodium of 113, a glucose of 339, a WBC of 20.5, and a lactic acid of 3.8. He was started on broad spec abx and given fluids.  Soon after admission, blood cultures became positive for 4/4 bottles MSSA.  Antibiotics were transitioned to Ancef.  Interval History:    ROS: Constitutional: positive for fatigue and malaise, negative for night sweats, Respiratory: negative for cough and sputum, Cardiovascular: negative for chest pain, and Gastrointestinal: negative for abdominal pain  Assessment & Plan: * Severe sepsis (HCC) - Fever, tachycardia, tachypnea, leukocytosis, lactic acidosis - possible infected hematoma/fluid collection from fall (follow up aspiration culture); urine growing SA but likely is seeded from blood too - 4/4  blood cultures on admission positive for MSSA -Continue Ancef for now  Bacteremia due to Staphylococcus aureus - 4/4 blood cultures on admission growing MSSA -Repeated blood cultures on 02/01/2021; continue to follow for clerance -follow up echo; CT chest worrisome for septic emboli - Source more likely felt to be possible infected left chest wall hematoma vs one of his other traumatic sites from his fall - IR consulted for drainage and culture of fluid collection - MRI T spine 02/01/21 negative for OM/discitis  - continue ancef - auto consult to ID for MSSA bacteremia; TEE is recommended; consult placed to cardiology  Hyponatremia - Presumed due to dehydration and hypokalemia - severely low on admission, 113 - Continue fluids; adjusting as needed - s/p 50 cc 3% NS bolus on 02/01/21 - continue trending BMP - Na starting to improve this am  Left shoulder pain - See work-up above - Was previously seen in the ER after his fall and recommended for a sling with outpatient Ortho follow-up -Bilateral shoulder x-rays unremarkable for now.  If aspiration from chest wall fluid collection unremarkable, may need to further work-up his shoulders with MRI  Anxiety - Wellbutrin and Zoloft on hold for now in setting of hyponatremia  HTN (hypertension) - Pressure normal on admission.  Will treat if needed  Diabetes mellitus (HCC) - Continue SSI and CBG monitoring -Continue Lantus - A1c 10.2% on 01/31/2021   Old records reviewed in assessment of this patient  Antimicrobials: Cefepime  01/31/21 x 1 Rocephin 01/31/21 x 1 Vanc 01/31/21 x 1 Ancef 02/01/21 >> current  DVT prophylaxis: enoxaparin (LOVENOX) injection 40 mg Start: 01/31/21 2200   Code Status:   Code Status: Full Code Family Communication:   Disposition Plan: Status is: Inpatient  Remains inpatient appropriate because:Ongoing diagnostic testing needed not appropriate for outpatient work up, IV treatments appropriate due to intensity of  illness or inability to take PO, and Inpatient level of care appropriate due to severity of illness  Dispo: The patient is from: Home              Anticipated d/c is to:  pending PT eval              Patient currently is not medically stable to d/c.   Difficult to place patient No  Risk of unplanned readmission score: Unplanned Admission- Pilot do not use: 20.34   Objective: Blood pressure 96/62, pulse 96, temperature 98.3 F (36.8 C), temperature source Oral, resp. rate 20, height  (1.778 m), weight 89.9 kg, SpO2 97 %.  Examination: General appearance: alert, cooperative, no distress, and slowed mentation Head: Normocephalic, without obvious abnormality, atraumatic Eyes:  EOMI Back:  left back noted with large ~10 area of swelling; no erythema, calor Lungs: clear to auscultation bilaterally Chest wall:  mild left chest TTP; swelling from back wraps to left lateral chest wall Heart: regular rate and rhythm, S1, S2 normal, and unable to appreciate any murmurs Abdomen: normal findings: bowel sounds normal and soft, non-tender Extremities:  no edema Skin: mobility and turgor normal Neurologic: Grossly normal  Consultants:  ID  Procedures:    Data Reviewed: I have personally reviewed following labs and imaging studies Results for orders placed or performed during the hospital encounter of 01/31/21 (from the past 24 hour(s))  Culture, blood (routine x 2)     Status: None (Preliminary result)   Collection Time: 02/01/21  2:20 PM   Specimen: BLOOD  Result Value Ref Range   Specimen Description      BLOOD RIGHT ANTECUBITAL Performed at [facility], [address], [place], [place] [address]    Special Requests      BOTTLES DRAWN AEROBIC AND ANAEROBIC Blood Culture adequate volume Performed at [facility], [address] [place], [place] [address]    Culture      NO GROWTH < 12 HOURS Performed at [facility], [address] [place], [place] [address]    Report Status PENDING   Culture, blood (routine x 2)     Status: None (Preliminary result)   Collection Time: 02/01/21  2:20 PM   Specimen: BLOOD RIGHT HAND  Result Value Ref Range   Specimen Description      BLOOD RIGHT HAND Performed at [facility], [address], [place], [place] [address]    Special Requests      BOTTLES DRAWN AEROBIC AND ANAEROBIC Blood Culture adequate volume Performed at [facility], [address], [place], [place] [address]    Culture      NO GROWTH < 12 HOURS Performed at [facility], [address] [place], [place] [address]    Report Status PENDING   Renal function panel     Status: Abnormal   Collection Time: 02/01/21  2:20 PM  Result Value Ref Range   Sodium 117 (LL) 135 - 145 mmol/L   Potassium 3.5 3.5 - 5.1 mmol/L   Chloride  83 (L) 98 - 111 mmol/L   CO2 23 22 - 32 mmol/L   Glucose, Bld 212 (H) 70 - 99 mg/dL   BUN 18 6 - 20 mg/dL   Creatinine, Ser 1.19 (L) 0.61 - 1.24 mg/dL   Calcium 7.3 (L) 8.9 - 10.3 mg/dL   Phosphorus 1.4 (L) 2.5 - 4.6 mg/dL   Albumin 2.0 (L) 3.5 - 5.0 g/dL   GFR, Estimated >14 >78 mL/min   Anion gap 11 5 - 15  Glucose, capillary     Status: Abnormal   Collection Time: 02/01/21  4:06 PM  Result Value Ref Range   Glucose-Capillary 173 (H) 70 - 99 mg/dL   Comment 1 Notify RN    Comment 2 Document in Chart   Renal function panel     Status: Abnormal   Collection Time: 02/01/21  5:43 PM  Result Value Ref Range   Sodium 118 (LL) 135 - 145 mmol/L   Potassium 3.5 3.5 - 5.1 mmol/L   Chloride 84 (L) 98 - 111 mmol/L   CO2 23 22 - 32 mmol/L   Glucose, Bld 153 (H) 70 - 99 mg/dL   BUN 17 6 - 20 mg/dL   Creatinine, Ser 2.95 (L) 0.61 - 1.24 mg/dL   Calcium 7.3 (L) 8.9 - 10.3 mg/dL   Phosphorus 1.7 (L) 2.5 - 4.6 mg/dL   Albumin 2.0 (L) 3.5 - 5.0 g/dL   GFR, Estimated >62 >13 mL/min   Anion gap 11 5 - 15  Glucose, capillary     Status: Abnormal    Collection Time: 02/01/21 10:39 PM  Result Value Ref Range   Glucose-Capillary 197 (H) 70 - 99 mg/dL  Basic metabolic panel     Status: Abnormal   Collection Time: 02/02/21 12:25 AM  Result Value Ref Range   Sodium 118 (LL) 135 - 145 mmol/L   Potassium 3.9 3.5 - 5.1 mmol/L   Chloride 85 (L) 98 - 111 mmol/L   CO2 23 22 - 32 mmol/L   Glucose, Bld 188 (H) 70 - 99 mg/dL   BUN 17 6 - 20 mg/dL   Creatinine, Ser 0.86 (L) 0.61 - 1.24 mg/dL   Calcium 7.3 (L) 8.9 - 10.3 mg/dL   GFR, Estimated >57 >84 mL/min   Anion gap 10 5 - 15  Sodium, urine, random     Status: None   Collection Time: 02/02/21  3:00 AM  Result Value Ref Range   Sodium, Ur <10 mmol/L  CBC with Differential/Platelet     Status: Abnormal   Collection Time: 02/02/21  7:17 AM  Result Value Ref Range   WBC 13.6 (H) 4.0 - 10.5 K/uL   RBC 4.00 (L) 4.22 - 5.81 MIL/uL   Hemoglobin 12.5 (L) 13.0 - 17.0 g/dL   HCT 69.6 (L) 29.5 - 28.4 %   MCV 84.3 80.0 - 100.0 fL   MCH 31.3 26.0 - 34.0 pg   MCHC 37.1 (H) 30.0 - 36.0 g/dL   RDW 13.2 44.0 - 10.2 %   Platelets 390 150 - 400 K/uL   nRBC 0.0 0.0 - 0.2 %   Neutrophils Relative % 88 %   Neutro Abs 11.9 (H) 1.7 - 7.7 K/uL   Lymphocytes Relative 5 %   Lymphs Abs 0.7 0.7 - 4.0 K/uL   Monocytes Relative 3 %   Monocytes Absolute 0.4 0.1 - 1.0 K/uL   Eosinophils Relative 1 %   Eosinophils Absolute 0.1 0.0 - 0.5 K/uL   Basophils Relative 0 %  Basophils Absolute 0.0 0.0 - 0.1 K/uL   Immature Granulocytes 3 %   Abs Immature Granulocytes 0.43 (H) 0.00 - 0.07 K/uL  Magnesium     Status: None   Collection Time: 02/02/21  7:17 AM  Result Value Ref Range   Magnesium 2.2 1.7 - 2.4 mg/dL  Basic metabolic panel     Status: Abnormal   Collection Time: 02/02/21  7:17 AM  Result Value Ref Range   Sodium 122 (L) 135 - 145 mmol/L   Potassium 3.4 (L) 3.5 - 5.1 mmol/L   Chloride 87 (L) 98 - 111 mmol/L   CO2 23 22 - 32 mmol/L   Glucose, Bld 193 (H) 70 - 99 mg/dL   BUN 16 6 - 20 mg/dL    Creatinine, Ser 3.81 (L) 0.61 - 1.24 mg/dL   Calcium 7.4 (L) 8.9 - 10.3 mg/dL   GFR, Estimated >82 >99 mL/min   Anion gap 12 5 - 15  Phosphorus     Status: None   Collection Time: 02/02/21  7:17 AM  Result Value Ref Range   Phosphorus 2.9 2.5 - 4.6 mg/dL  Glucose, capillary     Status: Abnormal   Collection Time: 02/02/21  8:15 AM  Result Value Ref Range   Glucose-Capillary 226 (H) 70 - 99 mg/dL   Comment 1 Notify RN    Comment 2 Document in Chart   Basic metabolic panel     Status: Abnormal   Collection Time: 02/02/21 12:11 PM  Result Value Ref Range   Sodium 125 (L) 135 - 145 mmol/L   Potassium 3.6 3.5 - 5.1 mmol/L   Chloride 89 (L) 98 - 111 mmol/L   CO2 23 22 - 32 mmol/L   Glucose, Bld 201 (H) 70 - 99 mg/dL   BUN 16 6 - 20 mg/dL   Creatinine, Ser 3.71 0.61 - 1.24 mg/dL   Calcium 7.8 (L) 8.9 - 10.3 mg/dL   GFR, Estimated >69 >67 mL/min   Anion gap 13 5 - 15  Glucose, capillary     Status: Abnormal   Collection Time: 02/02/21 12:14 PM  Result Value Ref Range   Glucose-Capillary 190 (H) 70 - 99 mg/dL   Comment 1 Notify RN    Comment 2 Document in Chart     Recent Results (from the past 240 hour(s))  Urine culture     Status: Abnormal   Collection Time: 01/31/21  8:51 AM   Specimen: In/Out Cath Urine  Result Value Ref Range Status   Specimen Description   Final    IN/OUT CATH URINE Performed at [facility], [address] [place], [place] [address]    Special Requests   Final    NONE Performed at [facility], [address], [place], [place] [address]    Culture >=100,000 COLONIES/mL STAPHYLOCOCCUS AUREUS (A)  Final   Report Status 02/02/2021 FINAL  Final   Organism ID, Bacteria STAPHYLOCOCCUS AUREUS (A)  Final      Susceptibility   Staphylococcus aureus - MIC*    CIPROFLOXACIN >=8 RESISTANT Resistant     GENTAMICIN <=0.5 SENSITIVE Sensitive     NITROFURANTOIN <=16 SENSITIVE Sensitive     OXACILLIN 0.5 SENSITIVE Sensitive      TETRACYCLINE <=1 SENSITIVE Sensitive     VANCOMYCIN 1 SENSITIVE Sensitive     TRIMETH/SULFA <=10 SENSITIVE Sensitive     CLINDAMYCIN <=0.25 SENSITIVE Sensitive     RIFAMPIN <=0.5 SENSITIVE Sensitive     Inducible Clindamycin  NEGATIVE Sensitive     * >=100,000 COLONIES/mL STAPHYLOCOCCUS AUREUS  Resp Panel by RT-PCR (Flu A&B, Covid) Nasopharyngeal Swab     Status: Abnormal   Collection Time: 01/31/21 10:59 AM   Specimen: Nasopharyngeal Swab; Nasopharyngeal(NP) swabs in vial transport medium  Result Value Ref Range Status   SARS Coronavirus 2 by RT PCR POSITIVE (A) NEGATIVE Final    Comment: RESULT CALLED TO, READ BACK BY AND VERIFIED WITH: GARRISON,G. RN AT 1206 01/31/21 MULLINS,T (NOTE) SARS-CoV-2 target nucleic acids are DETECTED.  The SARS-CoV-2 RNA is generally detectable in upper respiratory specimens during the acute phase of infection. Positive results are indicative of the presence of the identified virus, but do not rule out bacterial infection or co-infection with other pathogens not detected by the test. Clinical correlation with patient history and other diagnostic information is necessary to determine patient infection status. The expected result is Negative.  Fact Sheet for Patients: BloggerCourse.com  Fact Sheet for Healthcare Providers: SeriousBroker.it  This test is not yet approved or cleared by the [place] FDA and  has been authorized for detection and/or diagnosis of SARS-CoV-2 by FDA under an Emergency Use Authorization (EUA).  This EUA will remain in effect (meaning this tes t can be used) for the duration of  the COVID-19 declaration under Section 564(b)(1) of the Act, 21 U.S.C. section 360bbb-3(b)(1), unless the authorization is terminated or revoked sooner.     Influenza A by PCR NEGATIVE NEGATIVE Final   Influenza B by PCR NEGATIVE NEGATIVE Final    Comment: (NOTE) The Xpert Xpress  SARS-CoV-2/FLU/RSV plus assay is intended as an aid in the diagnosis of influenza from Nasopharyngeal swab specimens and should not be used as a sole basis for treatment. Nasal washings and aspirates are unacceptable for Xpert Xpress SARS-CoV-2/FLU/RSV testing.  Fact Sheet for Patients: BloggerCourse.com  Fact Sheet for Healthcare Providers: SeriousBroker.it  This test is not yet approved or cleared by the [place] FDA and has been authorized for detection and/or diagnosis of SARS-CoV-2 by FDA under an Emergency Use Authorization (EUA). This EUA will remain in effect (meaning this test can be used) for the duration of the COVID-19 declaration under Section 564(b)(1) of the Act, 21 U.S.C. section 360bbb-3(b)(1), unless the authorization is terminated or revoked.  Performed at [facility], [address] [place], [place] [address]   Blood Culture (routine x 2)     Status: Abnormal (Preliminary result)   Collection Time: 01/31/21 10:59 AM   Specimen: BLOOD  Result Value Ref Range Status   Specimen Description   Final    BLOOD LEFT ANTECUBITAL Performed at [facility], [address], [place], [place] [address]    Special Requests   Final    BOTTLES DRAWN AEROBIC AND ANAEROBIC Blood Culture adequate volume Performed at [facility], [address], [place], [place] [address]    Culture  Setup Time   Final    GRAM POSITIVE COCCI IN CLUSTERS IN BOTH AEROBIC AND ANAEROBIC BOTTLES CRITICAL RESULT CALLED TO, READ BACK BY AND VERIFIED WITH: PHARMD MICHELLE L. [phone] [id number] FCP    Culture (A)  Final    STAPHYLOCOCCUS AUREUS SUSCEPTIBILITIES TO FOLLOW Performed at [facility], [address], [place], [place] [address]    Report Status PENDING  Incomplete  Blood Culture ID Panel (Reflexed)     Status: Abnormal   Collection Time: 01/31/21 10:59 AM  Result Value Ref  Range Status   Enterococcus  faecalis NOT DETECTED NOT DETECTED Final   Enterococcus Faecium NOT DETECTED NOT DETECTED Final   Listeria monocytogenes NOT DETECTED NOT DETECTED Final   Staphylococcus species DETECTED (A) NOT DETECTED Final    Comment: CRITICAL RESULT CALLED TO, READ BACK BY AND VERIFIED WITH: PHARMD MICHELLE L. [id number] [id number] FCP    Staphylococcus aureus (BCID) DETECTED (A) NOT DETECTED Final    Comment: CRITICAL RESULT CALLED TO, READ BACK BY AND VERIFIED WITH: PHARMD MICHELLE L. [id number] [id number] FCP    Staphylococcus epidermidis NOT DETECTED NOT DETECTED Final   Staphylococcus lugdunensis NOT DETECTED NOT DETECTED Final   Streptococcus species NOT DETECTED NOT DETECTED Final   Streptococcus agalactiae NOT DETECTED NOT DETECTED Final   Streptococcus pneumoniae NOT DETECTED NOT DETECTED Final   Streptococcus pyogenes NOT DETECTED NOT DETECTED Final   A.calcoaceticus-baumannii NOT DETECTED NOT DETECTED Final   Bacteroides fragilis NOT DETECTED NOT DETECTED Final   Enterobacterales NOT DETECTED NOT DETECTED Final   Enterobacter cloacae complex NOT DETECTED NOT DETECTED Final   Escherichia coli NOT DETECTED NOT DETECTED Final   Klebsiella aerogenes NOT DETECTED NOT DETECTED Final   Klebsiella oxytoca NOT DETECTED NOT DETECTED Final   Klebsiella pneumoniae NOT DETECTED NOT DETECTED Final   Proteus species NOT DETECTED NOT DETECTED Final   Salmonella species NOT DETECTED NOT DETECTED Final   Serratia marcescens NOT DETECTED NOT DETECTED Final   Haemophilus influenzae NOT DETECTED NOT DETECTED Final   Neisseria meningitidis NOT DETECTED NOT DETECTED Final   Pseudomonas aeruginosa NOT DETECTED NOT DETECTED Final   Stenotrophomonas maltophilia NOT DETECTED NOT DETECTED Final   Candida albicans NOT DETECTED NOT DETECTED Final   Candida auris NOT DETECTED NOT DETECTED Final   Candida glabrata NOT DETECTED NOT DETECTED Final   Candida krusei NOT DETECTED NOT DETECTED Final   Candida  parapsilosis NOT DETECTED NOT DETECTED Final   Candida tropicalis NOT DETECTED NOT DETECTED Final   Cryptococcus neoformans/gattii NOT DETECTED NOT DETECTED Final   Meth resistant mecA/C and MREJ NOT DETECTED NOT DETECTED Final    Comment: Performed at [facility], [address] [place], [place] [address]  Blood Culture (routine x 2)     Status: Abnormal (Preliminary result)   Collection Time: 01/31/21 11:15 AM   Specimen: BLOOD  Result Value Ref Range Status   Specimen Description   Final    BLOOD RIGHT ANTECUBITAL Performed at [facility], [address] [place], [place] [address]    Special Requests   Final    BOTTLES DRAWN AEROBIC AND ANAEROBIC Blood Culture adequate volume Performed at [facility], [address] [place], [place] [address]    Culture  Setup Time   Final    GRAM POSITIVE COCCI IN CLUSTERS IN BOTH AEROBIC AND ANAEROBIC BOTTLES CRITICAL VALUE NOTED.  VALUE IS CONSISTENT WITH PREVIOUSLY REPORTED AND CALLED VALUE. Performed at [facility], [address] [place], [place] [address]    Culture STAPHYLOCOCCUS AUREUS (A)  Final   Report Status PENDING  Incomplete  MRSA PCR Screening     Status: None   Collection Time: 01/31/21  2:28 PM   Specimen: Nasal Mucosa; Nasopharyngeal  Result Value Ref Range Status   MRSA by PCR NEGATIVE NEGATIVE Final    Comment:        The GeneXpert MRSA Assay (FDA approved for NASAL specimens only), is one component of a comprehensive MRSA colonization surveillance program. It is not intended to diagnose MRSA infection nor  to guide or monitor treatment for MRSA infections. Performed at [facility], [address] [place], [place] [address]   Culture, blood (routine x 2)     Status: None (Preliminary result)   Collection Time: 02/01/21  2:20 PM   Specimen: BLOOD  Result Value Ref Range Status   Specimen Description   Final    BLOOD RIGHT  ANTECUBITAL Performed at [facility], [address] [place], [place] [address]    Special Requests   Final    BOTTLES DRAWN AEROBIC AND ANAEROBIC Blood Culture adequate volume Performed at [facility], [address], [place], [place] [address]    Culture   Final    NO GROWTH < 12 HOURS Performed at [facility], [address] [place], [place] [address]    Report Status PENDING  Incomplete  Culture, blood (routine x 2)     Status: None (Preliminary result)   Collection Time: 02/01/21  2:20 PM   Specimen: BLOOD RIGHT HAND  Result Value Ref Range Status   Specimen Description   Final    BLOOD RIGHT HAND Performed at [facility], [address] [place], [place] [address]    Special Requests   Final    BOTTLES DRAWN AEROBIC AND ANAEROBIC Blood Culture adequate volume Performed at [facility], [address] [place], [place] [address]    Culture   Final    NO GROWTH < 12 HOURS Performed at [facility], [address], [place], [place] [address]    Report Status PENDING  Incomplete     Radiology Studies: DG Shoulder Right  Result Date: 02/01/2021 CLINICAL DATA:  Bilateral shoulder pain without known injury. EXAM: RIGHT SHOULDER - 2+ VIEW COMPARISON:  None. FINDINGS: There is no evidence of fracture or dislocation. There is no evidence of arthropathy or other focal bone abnormality. Soft tissues are unremarkable. IMPRESSION: Negative. Electronically Signed   By: Lupita Raider M.D.   On: 02/01/2021 16:44   MR THORACIC SPINE W WO CONTRAST  Result Date: 02/01/2021 CLINICAL DATA:  Thoracic region back pain. Recent fall. Question epidural abscess. Bacteremia. EXAM: MRI THORACIC WITHOUT AND WITH CONTRAST TECHNIQUE: Multiplanar and multiecho pulse sequences of the thoracic spine were obtained without and with intravenous contrast. CONTRAST:  9mL GADAVIST GADOBUTROL 1 MMOL/ML IV SOLN COMPARISON:   None. FINDINGS: Alignment:  No abnormal alignment. Vertebrae: In general, the vertebral bodies show loss of height from T5 through T12 is could possibly be seen with chronic osteopenia. No evidence of a focal or recent compression fracture. No sign of bone infection. Cord:  See below regarding stenosis. Paraspinal and other soft tissues: Extensive T2 bright material in the left upper chest wall in the supraclavicular region and between the ribcage and the scapula, with peripheral enhancement. This could be sterile material related to hematoma or could be an infection, either a primary infection or a superinfected hematoma. Consider chest CT for complete evaluation. Disc levels: T1-2 and T2-3: Normal T3-4: Small left posterolateral disc protrusion but without apparent neural compression. T4-5: Congenital spinal stenosis. Chronic appearing central disc herniation. Facet and ligamentous hypertrophy. Spinal stenosis with some cord deformity. T5-6: Congenital spinal stenosis. Chronic appearing broad-based disc herniation more prominent towards the right. Facet and ligamentous hypertrophy. Severe spinal stenosis with cord deformity. T6-7: Congenital spinal stenosis. Chronic appearing broad-based disc herniation. Facet and ligamentous hypertrophy. No visible compressive effect upon the cord. T7-8: Congenital spinal  stenosis. Chronic appearing broad-based disc herniation more prominent towards the left. Facet and ligamentous hypertrophy. Some deformity of the ventral cord. T8-9: Congenital spinal stenosis. Broad-based disc herniation which could be relatively more recent. Facet and ligamentous prominence. Slight: Indentation of cord. T9-10 congenital spinal stenosis. Chronic appearing left posterolateral disc herniation with mild mass-effect upon the left side of the cord. T10-11: Congenital spinal stenosis. Chronic appearing shallow protrusion of the disc. Facet and ligamentous hypertrophy. Mild deformity of the cord.  T11-12: Congenital spinal stenosis. Chronic appearing disc protrusion more prominent towards the right. Facet and ligamentous prominence. Some deformity of the cord. T12-L1: Chronic appearing bilateral posterolateral disc herniations that indent the thecal sac. No distinct neural compression. IMPRESSION: Fluid collection in the left upper posterior chest wall as outlined above that could be sterile hematoma, infected hematoma or abscess on related to hematoma. Consider chest CT with contrast for better evaluation of extent. 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 Stenosis with some cord deformity which could be significant in the region from T4-5 through T11-12 as outlined above. No evidence infection involving the spine or spinal canal. Electronically Signed   By: Paulina Fusi M.D.   On: 02/01/2021 14:20   CT CHEST ABDOMEN PELVIS W CONTRAST  Result Date: 02/01/2021 CLINICAL DATA:  Fall, left chest and abdominal pain, midthoracic spine tenderness to palpation. Gram-positive bacteremia. EXAM: CT CHEST, ABDOMEN, AND PELVIS WITH CONTRAST TECHNIQUE: Multidetector CT imaging of the chest, abdomen and pelvis was performed following the standard protocol during bolus administration of intravenous contrast. CONTRAST:  OMNIPAQUE IOHEXOL 300 MG/ML  SOLN COMPARISON:  None. FINDINGS: CT CHEST FINDINGS Cardiovascular: No significant coronary artery calcification. Global cardiac size within normal limits. No pericardial effusion. Central pulmonary arteries are of normal caliber. Thoracic aorta is unremarkable. Mediastinum/Nodes: The thyroid gland is unremarkable. No pathologic thoracic adenopathy. The esophagus is unremarkable. Lungs/Pleura: A 7 mm peribronchial cavitary nodule is seen within the inferior lingula at axial image # 93/7 and additional 10 mm similar cavitary nodule is seen within the basilar lingula at  axial image # 101/7. A similar tiny cavitary nodule is seen within the right mid lung zone at axial image # 77/7 in a centrilobular location. A 7 mm noncalcified pulmonary nodule may demonstrate central umbilication suggesting early cavitation, best seen on coronal image # 36/5 at axial image # 72/7. Among the differential considerations, these most likely represent septic emboli given their random distribution and additional findings listed below. No pneumothorax or pleural effusion. The central airways are widely patent. Musculoskeletal: No acute bone abnormality. No lytic or blastic bone lesions. No focal osseous erosions. The osseous structures are age-appropriate. There is a multiloculated intramuscular fluid collection identified within the a subscapular posterior left chest wall measuring at least 5.5 x 7.3 x 10.4 cm in size on axial image # 11/2 and sagittal reformat # 164/6 which extends into the suprascapular region superiorly. The inflammatory changes adjacent to this collection extend inferiorly to involve the left latissimus dorsi musculature where multiple punctate foci of gas are identified. Together, the findings are suspicious for a a intramuscular abscess and/or an infected hematoma. There is soft tissue infiltration within the left chest wall laterally which appears separate from this collection which may represent edema related to trauma in this acutely traumatized patient or a separate focus of inflammatory change. CT ABDOMEN PELVIS FINDINGS Hepatobiliary: No focal liver abnormality is seen. No gallstones, gallbladder wall thickening, or biliary dilatation. Pancreas:  Unremarkable Spleen: Unremarkable Adrenals/Urinary Tract: Adrenal glands are unremarkable. Kidneys are normal, without renal calculi, focal lesion, or hydronephrosis. Bladder is unremarkable. Stomach/Bowel: Stomach is within normal limits. Appendix appears normal. No evidence of bowel wall thickening, distention, or inflammatory  changes. No free intraperitoneal gas or fluid. Vascular/Lymphatic: Minimal atherosclerotic calcification within the abdominal aorta. No aortic aneurysm. No pathologic adenopathy within the abdomen and pelvis. Reproductive: Prostate is unremarkable. Other: Tiny fat containing umbilical hernia. There is diffuse subcutaneous edema involving the body wall and flanks bilaterally in keeping with mild anasarca. Musculoskeletal: No acute bone abnormality within the abdomen and pelvis. No lytic or blastic bone lesion. L3-S1 lumbar fusion with instrumentation and resection of the spinous processes of L4 and L5 has been performed. No loculated fluid collection is clearly identified within this region. IMPRESSION: Multiloculated fluid collection within the subscapular left posterior chest wall extending superiorly into the suprascapular region and medially just lateral to the spinous processes of T1-3. This appears contiguous with inflammatory changes involving the latissimus dorsi muscle where extensive foci of gas are identified and is suspicious for a a intramuscular abscess and/or infected hematoma. Fluid components superiorly within the suprascapular region or paraspinal region should be amenable to ultrasound-guided sampling for microbiology evaluation. Multiple small cavitary nodules within the mid and lower lung zones randomly distributed. Among the differential considerations, which include cystic metastases and Langerhans cell histiocytosis, the associated findings listed above favor septic embolization as an etiology. These can be reassessed following completion of antibiotic therapy. Separate focus of inflammatory change within the left axilla possibly representing a focus of edema or hemorrhage related to trauma in this acutely traumatized patient. Alternatively, this may simply represent extension of the inflammatory process listed above. Electronically Signed   By: Helyn Numbers MD   On: 02/01/2021 20:00   DG  Shoulder Left  Result Date: 02/01/2021 CLINICAL DATA:  Bilateral shoulder pain without known injury. EXAM: LEFT SHOULDER - 2+ VIEW COMPARISON:  None. FINDINGS: There is no evidence of fracture or dislocation. There is no evidence of arthropathy or other focal bone abnormality. Soft tissues are unremarkable. IMPRESSION: Negative. Electronically Signed   By: Lupita Raider M.D.   On: 02/01/2021 16:43   CT CHEST ABDOMEN PELVIS W CONTRAST  Final Result    DG Shoulder Left  Final Result    DG Shoulder Right  Final Result    MR THORACIC SPINE W WO CONTRAST  Final Result    DG Chest Port 1 View  Final Result    IR US Guide Bx Asp/Drain    (Results Pending)    Scheduled Meds:  acetaminophen  1,000 mg Oral Q6H   Chlorhexidine Gluconate Cloth  6 each Topical Daily   enoxaparin (LOVENOX) injection  40 mg Subcutaneous Q24H   gabapentin  800 mg Oral TID   insulin aspart  0-15 Units Subcutaneous TID WC   insulin aspart  0-5 Units Subcutaneous QHS   insulin glargine  10 Units Subcutaneous Daily   mouth rinse  15 mL Mouth Rinse BID   oxyCODONE  20 mg Oral Q12H   PRN Meds: alum & mag hydroxide-simeth, docusate sodium, methocarbamol, morphine injection, ondansetron (ZOFRAN) IV, oxyCODONE, traZODone Continuous Infusions:  sodium chloride 100 mL/hr at 02/02/21 0844    ceFAZolin (ANCEF) IV Stopped (02/02/21 0618)     LOS: 2 days  Time spent: Greater than 50% of the 35 minute visit was spent in counseling/coordination of care for the patient as laid out in the A&P.  Lewie Chamber, MD [facility] 02/02/2021, 1:33 PM

## 2021-02-02 NOTE — Procedures (Signed)
Interventional Radiology Procedure Note  Procedure: US guided aspiration of left posterior chest wall fluid collection  Complications: None  Estimated Blood Loss: None  Findings: 5 Fr Yueh centesis catheter advanced into left posterior periscapular fluid collection yielding purulent, chalky fluid. Sample sent for culture. 60 mL able to be aspirated, resulting in significant decompression of collection by [place].  Jodi Marble. Fredia Sorrow, M.D Pager:  [phone]

## 2021-02-02 NOTE — Consult Note (Signed)
Chief Complaint: Patient was seen in consultation today for  Chief Complaint  Patient presents with   Generalized Body Aches    Referring Physician(s): Dr. Latanya MaudlinGiguis  Supervising Physician: Irish LackYamagata, Glenn  [facility]  History of Present Illness: John Ware is a 55 y.o. male with a medical history significant for DM, HTN, depression, spinal stenosis with neurogenic claudication, chronic pain and COVID-11 Jan 2021. [facility] with weakness, poor appetite, body aches and pain. He states he had a recent fall and injured his shoulder. He had a temperature of 103. Lab work in the ED was significant for a sodium of 113, WBC 20.5 and lactic acid 3.8. He was admitted for sepsis and subsequent work up revealed blood cultures positive for MSSA and imaging positive for a subscapular posterior chest wall fluid collection.  CT chest/abdomen/pelvis with contrast 02/01/21 Musculoskeletal: No acute bone abnormality. No lytic or blastic bone lesions. No focal osseous erosions. The osseous structures are age-appropriate. There is a multiloculated intramuscular fluid collection identified within the a subscapular posterior left chest wall measuring at least 5.5 x 7.3 x 10.4 cm in size on axial image # 11/2 and sagittal reformat # 164/6 which extends into the suprascapular region superiorly. The inflammatory changes adjacent to this collection extend inferiorly to involve the left latissimus dorsi musculature where multiple punctate foci of gas are identified. Together, the findings are suspicious for a a intramuscular abscess and/or an infected hematoma. There is soft tissue infiltration within the left chest wall laterally which appears separate from this collection which may represent edema related to trauma in this acutely traumatized patient or a separate focus of inflammatory change. IMPRESSION: 1. Multiloculated fluid collection within the subscapular  left posterior chest wall extending superiorly into the suprascapular region and medially just lateral to the spinous processes of T1-3. This appears contiguous with inflammatory changes involving the latissimus dorsi muscle where extensive foci of gas are identified and is suspicious for a a intramuscular abscess and/or infected hematoma. Fluid components superiorly within the suprascapular region or paraspinal region should be amenable to ultrasound-guided sampling for microbiology evaluation. 2. Multiple small cavitary nodules within the mid and lower lung zones randomly distributed. Among the differential considerations, which include cystic metastases and Langerhans cell histiocytosis, the associated findings listed above favor septic embolization as an etiology. These can be reassessed following completion of antibiotic therapy. 3. Separate focus of inflammatory change within the left axilla possibly representing a focus of edema or hemorrhage related to trauma in this acutely traumatized patient. Alternatively, this may simply represent extension of the inflammatory process listed above.  Interventional Radiology has been asked to evaluate this patient for an image-guided left posterior chest wall fluid collection aspiration. This case was reviewed and procedure approved by Dr. Fredia SorrowYamagata.   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 Authorizing Provider  acetaminophen (TYLENOL) 325 MG tablet Take 650-975 mg by mouth every 6 (six) hours as needed for mild pain, fever or headache.   Yes [provider]  amLODipine (NORVASC) 5 MG tablet Take 1 tablet (5 mg total) by mouth daily. 06/23/20  Yes Hawks, Christy A, FNP  buPROPion (WELLBUTRIN XL) 150 MG 24 hr tablet TAKE (1) TABLET TWICE A DAY. Patient taking differently: Take 150 mg by mouth 2 (two) times daily. 01/19/21  Yes Hawks, Christy A, FNP  busPIRone (BUSPAR) 5 MG tablet TAKE 1 TABLET 3 TIMES A DAY Patient taking differently: Take 5 mg by mouth 3 (three) times daily. 11/17/20  Yes Hawks, Christy A, FNP  gabapentin (NEURONTIN) 800 MG tablet TAKE (1) TABLET THREE TIMES DAILY. Patient taking differently: Take 800 mg by mouth 3 (three) times daily. 01/19/21  Yes Jannifer Rodney A, FNP  metFORMIN (GLUCOPHAGE XR) 750 MG 24 hr tablet Take 2 tablets (1,500 mg total) by mouth daily with breakfast. 06/23/20 12/20/20 Yes Hawks, Christy A, FNP  methocarbamol (ROBAXIN) 500 MG tablet Take 1 tablet (500 mg total) by mouth 2 (two) times daily as needed for muscle spasms. 01/20/21  Yes Caccavale, Sophia, PA-C  oxyCODONE ER (XTAMPZA ER) 18 MG C12A Take 1 tablet by mouth every 12 (twelve) hours. 11/26/19  Yes [provider]  oxyCODONE-acetaminophen (PERCOCET) 10-325 MG tablet Take 1 tablet by mouth 3 (three) times daily as needed for pain. 01/25/21  Yes [provider]  ramipril (ALTACE) 10 MG capsule TAKE (1) CAPSULE DAILY Patient taking differently: Take 10 mg by mouth daily. 06/23/20  Yes Hawks, Christy A, FNP  sertraline (ZOLOFT) 100 MG tablet Take 1 tablet (100 mg total) by mouth daily. 06/23/20  Yes Hawks, Christy A, FNP  Vitamin D, Ergocalciferol, (DRISDOL) 1.25 MG (50000 UNIT) CAPS capsule Take 50,000 Units by  mouth once a week. 01/25/21  Yes [provider]  diclofenac Sodium (VOLTAREN) 1 % GEL Apply 2 g topically 4 (four) times daily. Patient not taking: Reported on 01/31/2021 01/20/21   Caccavale, Sophia, PA-C  empagliflozin (JARDIANCE) 10 MG TABS tablet Take 1 tablet (10 mg total) by mouth daily before breakfast. Patient not taking: Reported on 01/31/2021 10/10/20   Junie Spencer, FNP  atorvastatin (LIPITOR) 20 MG tablet Take 1 tablet (20 mg total) by mouth daily. Patient not taking: Reported on 06/23/2020 02/29/20 08/23/20  Junie Spencer, FNP     Family History  Problem Relation Age of Onset   Colon cancer Neg Hx     Social History   Socioeconomic History   Marital status: Married    Spouse name: Not on file   Number of children: Not on file   Years of education: Not on file   Highest education level: Not on file  Occupational History   Not on file  Tobacco Use   Smoking status: Former    Packs/day: 2.00    Years: 15.00    Pack years: 30.00    Types: Cigarettes    Quit date: 2012    Years since quitting: 10.4   Smokeless tobacco: Never  Vaping Use   Vaping Use: Never used  Substance and Sexual Activity   Alcohol use: Yes    Comment: occasional   Drug use: No   Sexual activity: Not on file  Other Topics Concern   Not on file  Social History Narrative  Not on file   Social Determinants of Health   Financial Resource Strain: Not on file  Food Insecurity: Not on file  Transportation Needs: Not on file  Physical Activity: Not on file  Stress: Not on file  Social Connections: Not on file    Review of Systems: A 12 point ROS discussed and pertinent positives are indicated in the HPI above.  All other systems are negative.  Review of Systems  Constitutional:  Positive for diaphoresis, fatigue and fever. Negative for appetite change.  Respiratory:  Negative for cough and shortness of breath.   Cardiovascular:  Negative for chest pain and leg swelling.   Gastrointestinal:  Negative for abdominal pain, diarrhea, nausea and vomiting.  Musculoskeletal:        Generalized pain   Neurological:  Negative for dizziness and headaches.   Vital Signs: BP 114/70   Pulse 78   Temp 97.6 F (36.4 C) (Oral)   Resp 18   Ht 5\' 10"  (1.778 m)   Wt 198 lb 3.1 oz (89.9 kg)   SpO2 99%   BMI 28.44 kg/m   Physical Exam Constitutional:      General: He is not in acute distress. HENT:     Mouth/Throat:     Mouth: Mucous membranes are moist.     Pharynx: Oropharynx is clear.  Cardiovascular:     Rate and Rhythm: Normal rate and regular rhythm.     Pulses: Normal pulses.     Heart sounds: Normal heart sounds.  Pulmonary:     Effort: Pulmonary effort is normal.     Breath sounds: Normal breath sounds.  Abdominal:     General: Bowel sounds are normal.     Palpations: Abdomen is soft.  Skin:    General: Skin is moist.     Comments: Visibly diaphoretic   Neurological:     Mental Status: He is alert and oriented to person, place, and time.    Imaging: DG Shoulder Right  Result Date: 02/01/2021 CLINICAL DATA:  Bilateral shoulder pain without known injury. EXAM: RIGHT SHOULDER - 2+ VIEW COMPARISON:  None. FINDINGS: There is no evidence of fracture or dislocation. There is no evidence of arthropathy or other focal bone abnormality. Soft tissues are unremarkable. IMPRESSION: Negative. Electronically Signed   By: 04/03/2021 M.D.   On: 02/01/2021 16:44   MR THORACIC SPINE W WO CONTRAST  Result Date: 02/01/2021 CLINICAL DATA:  Thoracic region back pain. Recent fall. Question epidural abscess. Bacteremia. EXAM: MRI THORACIC WITHOUT AND WITH CONTRAST TECHNIQUE: Multiplanar and multiecho pulse sequences of the thoracic spine were obtained without and with intravenous contrast. CONTRAST:  41mL GADAVIST GADOBUTROL 1 MMOL/ML IV SOLN COMPARISON:  None. FINDINGS: Alignment:  No abnormal alignment. Vertebrae: In general, the vertebral bodies show loss of height  from T5 through T12 is could possibly be seen with chronic osteopenia. No evidence of a focal or recent compression fracture. No sign of bone infection. Cord:  See below regarding stenosis. Paraspinal and other soft tissues: Extensive T2 bright material in the left upper chest wall in the supraclavicular region and between the ribcage and the scapula, with peripheral enhancement. This could be sterile material related to hematoma or could be an infection, either a primary infection or a superinfected hematoma. Consider chest CT for complete evaluation. Disc levels: T1-2 and T2-3: Normal T3-4: Small left posterolateral disc protrusion but without apparent neural compression. T4-5: Congenital spinal stenosis. Chronic appearing central disc herniation. Facet and ligamentous hypertrophy. Spinal  stenosis with some cord deformity. T5-6: Congenital spinal stenosis. Chronic appearing broad-based disc herniation more prominent towards the right. Facet and ligamentous hypertrophy. Severe spinal stenosis with cord deformity. T6-7: Congenital spinal stenosis. Chronic appearing broad-based disc herniation. Facet and ligamentous hypertrophy. No visible compressive effect upon the cord. T7-8: Congenital spinal stenosis. Chronic appearing broad-based disc herniation more prominent towards the left. Facet and ligamentous hypertrophy. Some deformity of the ventral cord. T8-9: Congenital spinal stenosis. Broad-based disc herniation which could be relatively more recent. Facet and ligamentous prominence. Slight: Indentation of cord. T9-10 congenital spinal stenosis. Chronic appearing left posterolateral disc herniation with mild mass-effect upon the left side of the cord. T10-11: Congenital spinal stenosis. Chronic appearing shallow protrusion of the disc. Facet and ligamentous hypertrophy. Mild deformity of the cord. T11-12: Congenital spinal stenosis. Chronic appearing disc protrusion more prominent towards the right. Facet and  ligamentous prominence. Some deformity of the cord. T12-L1: Chronic appearing bilateral posterolateral disc herniations that indent the thecal sac. No distinct neural compression. IMPRESSION: Fluid collection in the left upper posterior chest wall as outlined above that could be sterile hematoma, infected hematoma or abscess on related to hematoma. Consider chest CT with contrast for better evaluation of extent. 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 Stenosis with some cord deformity which could be significant in the region from T4-5 through T11-12 as outlined above. No evidence infection involving the spine or spinal canal. Electronically Signed   By: Paulina Fusi M.D.   On: 02/01/2021 14:20   CT CHEST ABDOMEN PELVIS W CONTRAST  Result Date: 02/01/2021 CLINICAL DATA:  Fall, left chest and abdominal pain, midthoracic spine tenderness to palpation. Gram-positive bacteremia. EXAM: CT CHEST, ABDOMEN, AND PELVIS WITH CONTRAST TECHNIQUE: Multidetector CT imaging of the chest, abdomen and pelvis was performed following the standard protocol during bolus administration of intravenous contrast. CONTRAST:  OMNIPAQUE IOHEXOL 300 MG/ML  SOLN COMPARISON:  None. FINDINGS: CT CHEST FINDINGS Cardiovascular: No significant coronary artery calcification. Global cardiac size within normal limits. No pericardial effusion. Central pulmonary arteries are of normal caliber. Thoracic aorta is unremarkable. Mediastinum/Nodes: The thyroid gland is unremarkable. No pathologic thoracic adenopathy. The esophagus is unremarkable. Lungs/Pleura: A 7 mm peribronchial cavitary nodule is seen within the inferior lingula at axial image # 93/7 and additional 10 mm similar cavitary nodule is seen within the basilar lingula at axial image # 101/7. A similar tiny cavitary nodule is seen within the right mid lung zone at axial image # 77/7 in a  centrilobular location. A 7 mm noncalcified pulmonary nodule may demonstrate central umbilication suggesting early cavitation, best seen on coronal image # 36/5 at axial image # 72/7. Among the differential considerations, these most likely represent septic emboli given their random distribution and additional findings listed below. No pneumothorax or pleural effusion. The central airways are widely patent. Musculoskeletal: No acute bone abnormality. No lytic or blastic bone lesions. No focal osseous erosions. The osseous structures are age-appropriate. There is a multiloculated intramuscular fluid collection identified within the a subscapular posterior left chest wall measuring at least 5.5 x 7.3 x 10.4 cm in size on axial image # 11/2 and sagittal reformat # 164/6 which extends into the suprascapular region superiorly. The inflammatory changes adjacent to this collection extend inferiorly to involve the left latissimus dorsi musculature where multiple punctate foci of gas are identified. Together, the findings are suspicious for a a intramuscular abscess and/or an infected hematoma. There is soft tissue  infiltration within the left chest wall laterally which appears separate from this collection which may represent edema related to trauma in this acutely traumatized patient or a separate focus of inflammatory change. CT ABDOMEN PELVIS FINDINGS Hepatobiliary: No focal liver abnormality is seen. No gallstones, gallbladder wall thickening, or biliary dilatation. Pancreas: Unremarkable Spleen: Unremarkable Adrenals/Urinary Tract: Adrenal glands are unremarkable. Kidneys are normal, without renal calculi, focal lesion, or hydronephrosis. Bladder is unremarkable. Stomach/Bowel: Stomach is within normal limits. Appendix appears normal. No evidence of bowel wall thickening, distention, or inflammatory changes. No free intraperitoneal gas or fluid. Vascular/Lymphatic: Minimal atherosclerotic calcification within the  abdominal aorta. No aortic aneurysm. No pathologic adenopathy within the abdomen and pelvis. Reproductive: Prostate is unremarkable. Other: Tiny fat containing umbilical hernia. There is diffuse subcutaneous edema involving the body wall and flanks bilaterally in keeping with mild anasarca. Musculoskeletal: No acute bone abnormality within the abdomen and pelvis. No lytic or blastic bone lesion. L3-S1 lumbar fusion with instrumentation and resection of the spinous processes of L4 and L5 has been performed. No loculated fluid collection is clearly identified within this region. IMPRESSION: Multiloculated fluid collection within the subscapular left posterior chest wall extending superiorly into the suprascapular region and medially just lateral to the spinous processes of T1-3. This appears contiguous with inflammatory changes involving the latissimus dorsi muscle where extensive foci of gas are identified and is suspicious for a a intramuscular abscess and/or infected hematoma. Fluid components superiorly within the suprascapular region or paraspinal region should be amenable to ultrasound-guided sampling for microbiology evaluation. Multiple small cavitary nodules within the mid and lower lung zones randomly distributed. Among the differential considerations, which include cystic metastases and Langerhans cell histiocytosis, the associated findings listed above favor septic embolization as an etiology. These can be reassessed following completion of antibiotic therapy. Separate focus of inflammatory change within the left axilla possibly representing a focus of edema or hemorrhage related to trauma in this acutely traumatized patient. Alternatively, this may simply represent extension of the inflammatory process listed above. Electronically Signed   By: Helyn Numbers MD   On: 02/01/2021 20:00   DG Chest Port 1 View  Result Date: 01/31/2021 CLINICAL DATA:  Weakness. EXAM: PORTABLE CHEST 1 VIEW COMPARISON:  None.  FINDINGS: Cardiomediastinal silhouette is normal. Mediastinal contours appear intact. There is no evidence of focal airspace consolidation, pleural effusion or pneumothorax. Osseous structures are without acute abnormality. Soft tissues are grossly normal. IMPRESSION: No active disease. Electronically Signed   By: Ted Mcalpine M.D.   On: 01/31/2021 09:34   DG Shoulder Left  Result Date: 02/01/2021 CLINICAL DATA:  Bilateral shoulder pain without known injury. EXAM: LEFT SHOULDER - 2+ VIEW COMPARISON:  None. FINDINGS: There is no evidence of fracture or dislocation. There is no evidence of arthropathy or other focal bone abnormality. Soft tissues are unremarkable. IMPRESSION: Negative. Electronically Signed   By: Lupita Raider M.D.   On: 02/01/2021 16:43   DG Shoulder Left  Result Date: 01/20/2021 CLINICAL DATA:  55 year old male with left shoulder pain after fall 2 weeks ago. EXAM: LEFT SHOULDER - 2+ VIEW COMPARISON:  None. FINDINGS: There is no evidence of fracture or dislocation. Mild acromioclavicular joint degenerative changes. Soft tissues are unremarkable. IMPRESSION: No acute fracture or malalignment. Electronically Signed   By: Marliss Coots MD   On: 01/20/2021 09:11    Labs:  CBC: Recent Labs    01/31/21 0854 01/31/21 1510 02/01/21 0022 02/02/21 0717  WBC 20.5* 23.1* 19.5* 13.6*  HGB  14.5 13.7 13.2 12.5*  HCT 40.0 37.8* 35.5* 33.7*  PLT 438* 472* 393 390    COAGS: Recent Labs    01/31/21 1059 02/01/21 0022  INR 1.4* 1.3*  APTT 24  --     BMP: Recent Labs    02/25/20 0956 06/23/20 1630 10/09/20 1155 01/31/21 0854 02/01/21 1420 02/01/21 1743 02/02/21 0025 02/02/21 0717  NA 139 138 137   < > 117* 118* 118* 122*  K 5.0 4.6 4.9   < > 3.5 3.5 3.9 3.4*  CL 99 97 96   < > 83* 84* 85* 87*  CO2 < > GLUCOSE 216* 413* 308*   < > 212* 153* 188* 193*  BUN < > CALCIUM 9.7 9.6 10.2   < > 7.3* 7.3* 7.3* 7.4*   CREATININE 0.88 1.08 0.89   < > 0.56* 0.56* 0.53* 0.45*  GFRNONAA 99 78 98   < > >60 >60 >60 >60  GFRAA 114 91 113  --   --   --   --   --    < > = values in this interval not displayed.    LIVER FUNCTION TESTS: Recent Labs    02/25/20 0956 06/23/20 1630 10/09/20 1155 01/31/21 0854 01/31/21 1510 02/01/21 0022 02/01/21 0637 02/01/21 1420 02/01/21 1743  BILITOT 0.4 0.3 0.8 1.5*  --   --   --   --   --   AST 70*  --   --   --   --   --   ALT 34 36 21 53*  --   --   --   --   --   ALKPHOS 100 123* 113 88  --   --   --   --   --   PROT 7.5 7.2 7.6 6.2*  --   --   --   --   --   ALBUMIN 4.9 4.8 4.7 2.4*   < > 1.9* 1.8* 2.0* 2.0*   < > = values in this interval not displayed.    TUMOR MARKERS: No results for input(s): AFPTM, CEA, CA199, CHROMGRNA in the last 8760 hours.  Assessment and Plan:  Sepsis; left posterior chest wall fluid collection: John Ware, 55 year old male, is scheduled for an image-guided left posterior chest wall fluid collection aspiration today.   Risks and benefits discussed with the patient including bleeding, infection, damage to adjacent structures and sepsis.  All of the patient's questions were answered, patient is agreeable to proceed. Labs and vitals have been reviewed. He has been NPO since 0800 today. Last dose of Lovenox was 02/01/21 at 2247.   Consent signed and in chart.  Thank you for this interesting consult.  I greatly enjoyed meeting John Ware and look forward to participating in their care.  A copy of this report was sent to the requesting provider on this date.  Electronically Signed: Alwyn Ren, AGACNP-BC [phone] 02/02/2021, 9:46 AM   I spent a total of 20 Minutes    in face to face in clinical consultation, greater than 50% of which was counseling/coordinating care for left posterior chest wall fluid collection aspiration.

## 2021-02-02 NOTE — Assessment & Plan Note (Signed)
-   present on admission - responded to fluids and insulin - now resolved

## 2021-02-02 NOTE — Progress Notes (Signed)
Inpatient Rehab Admissions Coordinator:   I spoke to Pt.'s wife regarding insurance denial for CIR. She would like to appeal the decision. We have 3 days to appeal, so I will 1-2 more days to file appeal until Pt. Is more medically stable to give him him the best chance at winning his appeal.  Megan Salon, MS, CCC-SLP Rehab Admissions Coordinator  [phone] (celll) [phone] (office)

## 2021-02-02 NOTE — Progress Notes (Signed)
    [facility] has been requested to perform a transesophageal echocardiogram on Mr. Dollar for Bacteremia.  After careful review of history and examination, the risks and benefits of transesophageal echocardiogram have been explained including risks of esophageal damage, perforation (1:10,000 risk), bleeding, pharyngeal hematoma as well as other potential complications associated with conscious sedation including aspiration, arrhythmia, respiratory failure and death. Alternatives to treatment were discussed, questions were answered. Patient is willing to proceed.  TEE - Dr. Mayford Knife 02/05/21 @ 1400. NPO after midnight. Meds with sips.   Manson Passey, PA-C 02/02/2021 4:29 PM

## 2021-02-03 ENCOUNTER — Inpatient Hospital Stay (HOSPITAL_COMMUNITY): Payer: Commercial Managed Care - PPO

## 2021-02-03 DIAGNOSIS — A419 Sepsis, unspecified organism: Secondary | ICD-10-CM | POA: Diagnosis not present

## 2021-02-03 DIAGNOSIS — R7881 Bacteremia: Secondary | ICD-10-CM

## 2021-02-03 DIAGNOSIS — R6520 Severe sepsis without septic shock: Secondary | ICD-10-CM | POA: Diagnosis not present

## 2021-02-03 DIAGNOSIS — B9561 Methicillin susceptible Staphylococcus aureus infection as the cause of diseases classified elsewhere: Secondary | ICD-10-CM | POA: Diagnosis not present

## 2021-02-03 DIAGNOSIS — I77810 Thoracic aortic ectasia: Secondary | ICD-10-CM | POA: Insufficient documentation

## 2021-02-03 LAB — BASIC METABOLIC PANEL
Anion gap: 10 (ref 5–15)
Anion gap: 11 (ref 5–15)
Anion gap: 12 (ref 5–15)
Anion gap: 10 (ref 5–15)
BUN: 16 mg/dL (ref 6–20)
BUN: 13 mg/dL (ref 6–20)
BUN: 10 mg/dL (ref 6–20)
BUN: 10 mg/dL (ref 6–20)
CO2: 25 mmol/L (ref 22–32)
CO2: 24 mmol/L (ref 22–32)
CO2: 23 mmol/L (ref 22–32)
CO2: 23 mmol/L (ref 22–32)
Calcium: 7.7 mg/dL — ABNORMAL LOW (ref 8.9–10.3)
Calcium: 7.7 mg/dL — ABNORMAL LOW (ref 8.9–10.3)
Calcium: 7.5 mg/dL — ABNORMAL LOW (ref 8.9–10.3)
Calcium: 7.5 mg/dL — ABNORMAL LOW (ref 8.9–10.3)
Chloride: 90 mmol/L — ABNORMAL LOW (ref 98–111)
Chloride: 89 mmol/L — ABNORMAL LOW (ref 98–111)
Chloride: 87 mmol/L — ABNORMAL LOW (ref 98–111)
Chloride: 88 mmol/L — ABNORMAL LOW (ref 98–111)
Creatinine, Ser: 0.58 mg/dL — ABNORMAL LOW (ref 0.61–1.24)
Creatinine, Ser: 0.44 mg/dL — ABNORMAL LOW (ref 0.61–1.24)
Creatinine, Ser: 0.48 mg/dL — ABNORMAL LOW (ref 0.61–1.24)
Creatinine, Ser: 0.66 mg/dL (ref 0.61–1.24)
GFR, Estimated: >60 mL/min (ref >60)
GFR, Estimated: >60 mL/min (ref >60)
GFR, Estimated: >60 mL/min (ref >60)
GFR, Estimated: >60 mL/min (ref >60)
Glucose, Bld: 242 mg/dL — ABNORMAL HIGH (ref 70–99)
Glucose, Bld: 226 mg/dL — ABNORMAL HIGH (ref 70–99)
Glucose, Bld: 266 mg/dL — ABNORMAL HIGH (ref 70–99)
Glucose, Bld: 181 mg/dL — ABNORMAL HIGH (ref 70–99)
Potassium: 3.6 mmol/L (ref 3.5–5.1)
Potassium: 3.8 mmol/L (ref 3.5–5.1)
Potassium: 3.8 mmol/L (ref 3.5–5.1)
Potassium: 3.7 mmol/L (ref 3.5–5.1)
Sodium: 125 mmol/L — ABNORMAL LOW (ref 135–145)
Sodium: 124 mmol/L — ABNORMAL LOW (ref 135–145)
Sodium: 122 mmol/L — ABNORMAL LOW (ref 135–145)
Sodium: 121 mmol/L — ABNORMAL LOW (ref 135–145)

## 2021-02-03 LAB — CBC WITH DIFFERENTIAL/PLATELET
Abs Immature Granulocytes: 0.38 10*3/uL — ABNORMAL HIGH (ref 0.00–0.07)
Basophils Absolute: 0 10*3/uL (ref 0.0–0.1)
Basophils Relative: 0 %
Eosinophils Absolute: 0 10*3/uL (ref 0.0–0.5)
Eosinophils Relative: 0 %
HCT: 35.9 % — ABNORMAL LOW (ref 39.0–52.0)
Hemoglobin: 12.8 g/dL — ABNORMAL LOW (ref 13.0–17.0)
Immature Granulocytes: 3 %
Lymphocytes Relative: 6 %
Lymphs Abs: 0.8 10*3/uL (ref 0.7–4.0)
MCH: 30.3 pg (ref 26.0–34.0)
MCHC: 35.7 g/dL (ref 30.0–36.0)
MCV: 84.9 fL (ref 80.0–100.0)
Monocytes Absolute: 0.4 10*3/uL (ref 0.1–1.0)
Monocytes Relative: 3 %
Neutro Abs: 12.6 10*3/uL — ABNORMAL HIGH (ref 1.7–7.7)
Neutrophils Relative %: 88 %
Platelets: 443 10*3/uL — ABNORMAL HIGH (ref 150–400)
RBC: 4.23 MIL/uL (ref 4.22–5.81)
RDW: 13.6 % (ref 11.5–15.5)
WBC: 14.3 10*3/uL — ABNORMAL HIGH (ref 4.0–10.5)
nRBC: 0 % (ref 0.0–0.2)

## 2021-02-03 LAB — GLUCOSE, CAPILLARY
Glucose-Capillary: 267 mg/dL — ABNORMAL HIGH (ref 70–99)
Glucose-Capillary: 280 mg/dL — ABNORMAL HIGH (ref 70–99)
Glucose-Capillary: 287 mg/dL — ABNORMAL HIGH (ref 70–99)
Glucose-Capillary: 187 mg/dL — ABNORMAL HIGH (ref 70–99)

## 2021-02-03 LAB — ECHOCARDIOGRAM COMPLETE
Area-P 1/2: 4.6 cm2
Height: 70 in
S' Lateral: 2.6 cm
Weight: 3171.1 oz

## 2021-02-03 LAB — CULTURE, BLOOD (ROUTINE X 2): Special Requests: ADEQUATE

## 2021-02-03 LAB — MAGNESIUM: Magnesium: 2.3 mg/dL (ref 1.7–2.4)

## 2021-02-03 LAB — PHOSPHORUS: Phosphorus: 2.7 mg/dL (ref 2.5–4.6)

## 2021-02-03 MED ORDER — PERFLUTREN LIPID MICROSPHERE
1.0000 mL | INTRAVENOUS | Status: AC | PRN
  Administered 2021-02-03: 2 mL via INTRAVENOUS
  Filled 2021-02-03: qty 10

## 2021-02-03 NOTE — Progress Notes (Signed)
Progress Note    John Ware   ZOX:[id number]  DOB: 27-Mar-1966  DOA: 01/31/2021     3  PCP: Junie Spencer, FNP  CC: Weakness, ongoing pain  Hospital Course: John Ware is a 55 y.o. male with medical history significant of chronic pain, HTN, DM2. Presenting with weakness. History is from wife as the patient is a poor historian.  She reports that the patient has been generally unwell over the past 3 weeks or so. He had a positive home COVID test on 01/12/21. He had no respiratory symptoms at the time. He just had fevers that were responsive to APAP. He self isolated at home until 01/20/21 when he was at a party. He apparently fell out of a chair and hurt his left shoulder. He was evaluated in the ED and they found no fracture. He was given a sling and told to follow up with orthopedics.  Since that time, the wife reports that he's had general body aches/pain that have increased and are not responsive to his regular chronic pain meds. He has had a generally poor appetite during this time. She reports that he's been unable to walk for the last 2 days. These constellation of symptoms were concerning, so she brought him to the ED.    He was found to have a sodium of 113, a glucose of 339, a WBC of 20.5, and a lactic acid of 3.8. He was started on broad spec abx and given fluids.  Soon after admission, blood cultures became positive for 4/4 bottles MSSA.  Antibiotics were transitioned to Ancef.  With further work-up and collateral information he was found to have a large fluid collection involving his left lateral and posterior chest/back.  He also had tenderness in his mid back.  He underwent MRI T-spine and CT chest/abdomen/pelvis.  There was no evidence of osteomyelitis or discitis or other underlying infection on MRI T-spine.  CT chest showed large multiloculated fluid collection.  IR was consulted and he underwent ultrasound guided aspiration which was sent for culturing.  CT chest was  also concerning for evidence of septic emboli.  2D echo did not reveal any evidence of endocarditis.  Cardiology was consulted for a TEE per ID recommendations as well.  Interval History:  No events overnight.  Still complaining of sore shoulders.  Mentation is improved some.  Reviewed recent results and tentative plan for TEE on Monday with cardiology.  ROS: Constitutional: positive for fatigue and malaise, negative for night sweats, Respiratory: negative for cough and sputum, Cardiovascular: negative for chest pain, and Gastrointestinal: negative for abdominal pain  Assessment & Plan: * Severe sepsis (HCC) - Fever, tachycardia, tachypnea, leukocytosis, lactic acidosis - possible infected hematoma/fluid collection from fall (follow up aspiration culture); urine growing SA but likely is seeded from blood too - 4/4 blood cultures on admission positive for MSSA -Continue Ancef for now  Bacteremia due to Staphylococcus aureus - 4/4 blood cultures on admission growing MSSA -Repeated blood cultures on 02/01/2021; NGTD - echo negative for vegetations; cardiology consulted for TEE, tentatively planned for 02/05/21 - now s/p IR drainage of left posterior chest wall fluid collection which was noted to be purulent in nature; follow up culture results - MRI T spine 02/01/21 negative for OM/discitis  - continue ancef - ID following  Hyponatremia - Presumed due to dehydration and hypokalemia - severely low on admission, 113 - Continue fluids; adjusting as needed - s/p 50 cc 3% NS bolus on 02/01/21 - continue  trending BMP - Na starting to improve this am  Left shoulder pain - See work-up above - Was previously seen in the ER after his fall and recommended for a sling with outpatient Ortho follow-up -Bilateral shoulder x-rays unremarkable for now.  If aspiration from chest wall fluid collection unremarkable, may need to further work-up his shoulders with MRI  DKA (diabetic ketoacidosis) (HCC)-resolved  as of 02/02/2021 - present on admission - responded to fluids and insulin - now resolved  Ascending aorta dilatation (HCC) - TTE on 6/10 notes mild dilation of ascending aorta, 38 mm - follow TEE results as well   Anxiety - Wellbutrin and Zoloft on hold for now in setting of hyponatremia  HTN (hypertension) - Pressure normal on admission.  Will treat if needed  Diabetes mellitus (HCC) - Continue SSI and CBG monitoring -Continue Lantus - A1c 10.2% on 01/31/2021   Old records reviewed in assessment of this patient  Antimicrobials: Cefepime 01/31/21 x 1 Rocephin 01/31/21 x 1 Vanc 01/31/21 x 1 Ancef 02/01/21 >> current  DVT prophylaxis: enoxaparin (LOVENOX) injection 40 mg Start: 01/31/21 2200   Code Status:   Code Status: Full Code Family Communication:   Disposition Plan: Status is: Inpatient  Remains inpatient appropriate because:Ongoing diagnostic testing needed not appropriate for outpatient work up, IV treatments appropriate due to intensity of illness or inability to take PO, and Inpatient level of care appropriate due to severity of illness  Dispo: The patient is from: Home              Anticipated d/c is to:  Possibly CIR              Patient currently is not medically stable to d/c.   Difficult to place patient No  Risk of unplanned readmission score: Unplanned Admission- Pilot do not use: 20.83   Objective: Blood pressure 129/83, pulse (!) 127, temperature 99.4 F (37.4 C), temperature source Oral, resp. rate (!) 29, height 5\' 10"  (1.778 m), weight 89.9 kg, SpO2 96 %.  Examination: General appearance: alert, cooperative, no distress, and slowed mentation Head: Normocephalic, without obvious abnormality, atraumatic Eyes:  EOMI Back:  left back noted with large ~10 area of swelling; no erythema, calor Lungs: clear to auscultation bilaterally Chest wall:  mild left chest TTP; swelling from back wraps to left lateral chest wall Heart: regular rate and rhythm, S1, S2  normal, and unable to appreciate any murmurs Abdomen: normal findings: bowel sounds normal and soft, non-tender Extremities:  no edema Skin: mobility and turgor normal Neurologic: Grossly normal  Consultants:  ID  Procedures:  IR drainage of back fluid collection, 6/10  Data Reviewed: I have personally reviewed following labs and imaging studies Results for orders placed or performed during the hospital encounter of 01/31/21 (from the past 24 hour(s))  Aerobic/Anaerobic Culture w Gram Stain (surgical/deep wound)     Status: None (Preliminary result)   Collection Time: 02/02/21  4:12 PM   Specimen: Abscess  Result Value Ref Range   Specimen Description      ABSCESS SUBSCAPULAR Performed at [facility], [address], [place], [place]    Special Requests      NONE Performed at [facility], [address], [place], [place]    Gram Stain PENDING    Culture      TOO YOUNG TO READ Performed at [facility], [address] [place], [place]    Report Status PENDING  Glucose, capillary     Status: Abnormal   Collection Time: 02/02/21  5:51 PM  Result Value Ref Range   Glucose-Capillary 195 (H) 70 - 99 mg/dL   Comment 1 Document in Chart   Basic metabolic panel     Status: Abnormal   Collection Time: 02/02/21  7:15 PM  Result Value Ref Range   Sodium 126 (L) 135 - 145 mmol/L   Potassium 3.6 3.5 - 5.1 mmol/L   Chloride 90 (L) 98 - 111 mmol/L   CO2 24 22 - 32 mmol/L   Glucose, Bld 186 (H) 70 - 99 mg/dL   BUN 17 6 - 20 mg/dL   Creatinine, Ser 1.61 (L) 0.61 - 1.24 mg/dL   Calcium 8.0 (L) 8.9 - 10.3 mg/dL   GFR, Estimated >09 >60 mL/min   Anion gap 12 5 - 15  Sodium, urine, random     Status: None   Collection Time: 02/02/21  9:25 PM  Result Value Ref Range   Sodium, Ur <10 mmol/L  Glucose, capillary     Status: Abnormal   Collection Time: 02/02/21  9:30 PM  Result Value Ref Range    Glucose-Capillary 194 (H) 70 - 99 mg/dL  Basic metabolic panel     Status: Abnormal   Collection Time: 02/03/21 12:29 AM  Result Value Ref Range   Sodium 125 (L) 135 - 145 mmol/L   Potassium 3.6 3.5 - 5.1 mmol/L   Chloride 90 (L) 98 - 111 mmol/L   CO2 25 22 - 32 mmol/L   Glucose, Bld 242 (H) 70 - 99 mg/dL   BUN 16 6 - 20 mg/dL   Creatinine, Ser 4.54 (L) 0.61 - 1.24 mg/dL   Calcium 7.7 (L) 8.9 - 10.3 mg/dL   GFR, Estimated >09 >81 mL/min   Anion gap 10 5 - 15  CBC with Differential/Platelet     Status: Abnormal   Collection Time: 02/03/21 12:29 AM  Result Value Ref Range   WBC 14.3 (H) 4.0 - 10.5 K/uL   RBC 4.23 4.22 - 5.81 MIL/uL   Hemoglobin 12.8 (L) 13.0 - 17.0 g/dL   HCT 19.1 (L) 47.8 - 29.5 %   MCV 84.9 80.0 - 100.0 fL   MCH 30.3 26.0 - 34.0 pg   MCHC 35.7 30.0 - 36.0 g/dL   RDW 62.1 30.8 - 65.7 %   Platelets 443 (H) 150 - 400 K/uL   nRBC 0.0 0.0 - 0.2 %   Neutrophils Relative % 88 %   Neutro Abs 12.6 (H) 1.7 - 7.7 K/uL   Lymphocytes Relative 6 %   Lymphs Abs 0.8 0.7 - 4.0 K/uL   Monocytes Relative 3 %   Monocytes Absolute 0.4 0.1 - 1.0 K/uL   Eosinophils Relative 0 %   Eosinophils Absolute 0.0 0.0 - 0.5 K/uL   Basophils Relative 0 %   Basophils Absolute 0.0 0.0 - 0.1 K/uL   WBC Morphology TOXIC GRANULATION    Immature Granulocytes 3 %   Abs Immature Granulocytes 0.38 (H) 0.00 - 0.07 K/uL   Target Cells PRESENT   Magnesium     Status: None   Collection Time: 02/03/21 12:29 AM  Result Value Ref Range   Magnesium 2.3 1.7 - 2.4 mg/dL  Phosphorus     Status: None   Collection Time: 02/03/21 12:29 AM  Result Value Ref Range   Phosphorus 2.7 2.5 - 4.6 mg/dL  Basic metabolic panel     Status: Abnormal   Collection Time: 02/03/21  5:50 AM  Result Value Ref Range   Sodium 124 (L) 135 - 145 mmol/L   Potassium 3.8 3.5 - 5.1 mmol/L   Chloride 89 (L) 98 - 111 mmol/L   CO2 24 22 - 32 mmol/L   Glucose, Bld 226 (H) 70 - 99 mg/dL   BUN 13 6 - 20 mg/dL   Creatinine, Ser  1.91 (L) 0.61 - 1.24 mg/dL   Calcium 7.7 (L) 8.9 - 10.3 mg/dL   GFR, Estimated >47 >82 mL/min   Anion gap 11 5 - 15  Glucose, capillary     Status: Abnormal   Collection Time: 02/03/21  8:35 AM  Result Value Ref Range   Glucose-Capillary 267 (H) 70 - 99 mg/dL  Glucose, capillary     Status: Abnormal   Collection Time: 02/03/21 11:14 AM  Result Value Ref Range   Glucose-Capillary 280 (H) 70 - 99 mg/dL  Basic metabolic panel     Status: Abnormal   Collection Time: 02/03/21 11:37 AM  Result Value Ref Range   Sodium 122 (L) 135 - 145 mmol/L   Potassium 3.8 3.5 - 5.1 mmol/L   Chloride 87 (L) 98 - 111 mmol/L   CO2 23 22 - 32 mmol/L   Glucose, Bld 266 (H) 70 - 99 mg/dL   BUN 10 6 - 20 mg/dL   Creatinine, Ser 9.56 (L) 0.61 - 1.24 mg/dL   Calcium 7.5 (L) 8.9 - 10.3 mg/dL   GFR, Estimated >21 >30 mL/min   Anion gap 12 5 - 15    Recent Results (from the past 240 hour(s))  Urine culture     Status: Abnormal   Collection Time: 01/31/21  8:51 AM   Specimen: In/Out Cath Urine  Result Value Ref Range Status   Specimen Description   Final    IN/OUT CATH URINE Performed at [facility], [address] [place], [place] [address]    Special Requests   Final    NONE Performed at [facility], [address] [place], [place] [address]    Culture >=100,000 COLONIES/mL STAPHYLOCOCCUS AUREUS (A)  Final   Report Status 02/02/2021 FINAL  Final   Organism ID, Bacteria STAPHYLOCOCCUS AUREUS (A)  Final      Susceptibility   Staphylococcus aureus - MIC*    CIPROFLOXACIN >=8 RESISTANT Resistant     GENTAMICIN <=0.5 SENSITIVE Sensitive     NITROFURANTOIN <=16 SENSITIVE Sensitive     OXACILLIN 0.5 SENSITIVE Sensitive     TETRACYCLINE <=1 SENSITIVE Sensitive     VANCOMYCIN 1 SENSITIVE Sensitive     TRIMETH/SULFA <=10 SENSITIVE Sensitive     CLINDAMYCIN <=0.25 SENSITIVE Sensitive     RIFAMPIN <=0.5 SENSITIVE Sensitive     Inducible Clindamycin NEGATIVE  Sensitive     * >=100,000 COLONIES/mL STAPHYLOCOCCUS AUREUS  Resp Panel by RT-PCR (Flu A&B, Covid) Nasopharyngeal Swab     Status: Abnormal   Collection Time: 01/31/21 10:59 AM   Specimen: Nasopharyngeal Swab; Nasopharyngeal(NP) swabs in vial transport medium  Result Value Ref Range Status   SARS Coronavirus 2 by RT PCR POSITIVE (A) NEGATIVE Final    Comment: RESULT CALLED TO, READ BACK BY AND VERIFIED WITH: GARRISON,G. RN AT 1206 01/31/21 MULLINS,T (NOTE) SARS-CoV-2 target nucleic acids are DETECTED.  The SARS-CoV-2 RNA is generally detectable in upper respiratory specimens during the acute phase of infection. Positive results are indicative of the presence of the identified virus, but do not rule out bacterial infection or co-infection with other  pathogens not detected by the test. Clinical correlation with patient history and other diagnostic information is necessary to determine patient infection status. The expected result is Negative.  Fact Sheet for Patients: BloggerCourse.com  Fact Sheet for Healthcare Providers: SeriousBroker.it  This test is not yet approved or cleared by the [place] FDA and  has been authorized for detection and/or diagnosis of SARS-CoV-2 by FDA under an Emergency Use Authorization (EUA).  This EUA will remain in effect (meaning this tes t can be used) for the duration of  the COVID-19 declaration under Section 564(b)(1) of the Act, 21 U.S.C. section 360bbb-3(b)(1), unless the authorization is terminated or revoked sooner.     Influenza A by PCR NEGATIVE NEGATIVE Final   Influenza B by PCR NEGATIVE NEGATIVE Final    Comment: (NOTE) The Xpert Xpress SARS-CoV-2/FLU/RSV plus assay is intended as an aid in the diagnosis of influenza from Nasopharyngeal swab specimens and should not be used as a sole basis for treatment. Nasal washings and aspirates are unacceptable for Xpert Xpress  SARS-CoV-2/FLU/RSV testing.  Fact Sheet for Patients: BloggerCourse.com  Fact Sheet for Healthcare Providers: SeriousBroker.it  This test is not yet approved or cleared by the [place] FDA and has been authorized for detection and/or diagnosis of SARS-CoV-2 by FDA under an Emergency Use Authorization (EUA). This EUA will remain in effect (meaning this test can be used) for the duration of the COVID-19 declaration under Section 564(b)(1) of the Act, 21 U.S.C. section 360bbb-3(b)(1), unless the authorization is terminated or revoked.  Performed at [facility], [address] [place], [place] [address]   Blood Culture (routine x 2)     Status: Abnormal (Preliminary result)   Collection Time: 01/31/21 10:59 AM   Specimen: BLOOD  Result Value Ref Range Status   Specimen Description   Final    BLOOD LEFT ANTECUBITAL Performed at [facility], [address] [place], [place] [address]    Special Requests   Final    BOTTLES DRAWN AEROBIC AND ANAEROBIC Blood Culture adequate volume Performed at [facility], [address] [place], [place] [address]    Culture  Setup Time   Final    GRAM POSITIVE COCCI IN CLUSTERS IN BOTH AEROBIC AND ANAEROBIC BOTTLES CRITICAL RESULT CALLED TO, READ BACK BY AND VERIFIED WITH: PHARMD MICHELLE L. [phone] FCP    Culture (A)  Final    STAPHYLOCOCCUS AUREUS SUSCEPTIBILITIES TO FOLLOW Performed at [facility], [address] [place], [place] [address]    Report Status PENDING  Incomplete  Blood Culture ID Panel (Reflexed)     Status: Abnormal   Collection Time: 01/31/21 10:59 AM  Result Value Ref Range Status   Enterococcus faecalis NOT DETECTED NOT DETECTED Final   Enterococcus Faecium NOT DETECTED NOT DETECTED Final   Listeria monocytogenes NOT DETECTED NOT DETECTED Final   Staphylococcus species DETECTED (A) NOT DETECTED  Final    Comment: CRITICAL RESULT CALLED TO, READ BACK BY AND VERIFIED WITH: PHARMD MICHELLE L. [phone] FCP    Staphylococcus aureus (BCID) DETECTED (A) NOT DETECTED Final    Comment: CRITICAL RESULT CALLED TO, READ BACK BY AND VERIFIED WITH: PHARMD MICHELLE L. [phone] FCP    Staphylococcus epidermidis NOT DETECTED NOT DETECTED Final   Staphylococcus lugdunensis NOT DETECTED NOT DETECTED Final   Streptococcus species NOT DETECTED NOT DETECTED Final   Streptococcus agalactiae NOT DETECTED NOT DETECTED Final   Streptococcus pneumoniae NOT DETECTED NOT  DETECTED Final   Streptococcus pyogenes NOT DETECTED NOT DETECTED Final   A.calcoaceticus-baumannii NOT DETECTED NOT DETECTED Final   Bacteroides fragilis NOT DETECTED NOT DETECTED Final   Enterobacterales NOT DETECTED NOT DETECTED Final   Enterobacter cloacae complex NOT DETECTED NOT DETECTED Final   Escherichia coli NOT DETECTED NOT DETECTED Final   Klebsiella aerogenes NOT DETECTED NOT DETECTED Final   Klebsiella oxytoca NOT DETECTED NOT DETECTED Final   Klebsiella pneumoniae NOT DETECTED NOT DETECTED Final   Proteus species NOT DETECTED NOT DETECTED Final   Salmonella species NOT DETECTED NOT DETECTED Final   Serratia marcescens NOT DETECTED NOT DETECTED Final   Haemophilus influenzae NOT DETECTED NOT DETECTED Final   Neisseria meningitidis NOT DETECTED NOT DETECTED Final   Pseudomonas aeruginosa NOT DETECTED NOT DETECTED Final   Stenotrophomonas maltophilia NOT DETECTED NOT DETECTED Final   Candida albicans NOT DETECTED NOT DETECTED Final   Candida auris NOT DETECTED NOT DETECTED Final   Candida glabrata NOT DETECTED NOT DETECTED Final   Candida krusei NOT DETECTED NOT DETECTED Final   Candida parapsilosis NOT DETECTED NOT DETECTED Final   Candida tropicalis NOT DETECTED NOT DETECTED Final   Cryptococcus neoformans/gattii NOT DETECTED NOT DETECTED Final   Meth resistant mecA/C and MREJ NOT DETECTED NOT DETECTED Final     Comment: Performed at [facility], [address], [place], [place] [address]  Blood Culture (routine x 2)     Status: Abnormal   Collection Time: 01/31/21 11:15 AM   Specimen: BLOOD  Result Value Ref Range Status   Specimen Description   Final    BLOOD RIGHT ANTECUBITAL Performed at [facility], [address] [place], [place] [address]    Special Requests   Final    BOTTLES DRAWN AEROBIC AND ANAEROBIC Blood Culture adequate volume Performed at [facility], [address], [place], [place] [address]    Culture  Setup Time   Final    GRAM POSITIVE COCCI IN CLUSTERS IN BOTH AEROBIC AND ANAEROBIC BOTTLES CRITICAL VALUE NOTED.  VALUE IS CONSISTENT WITH PREVIOUSLY REPORTED AND CALLED VALUE.    Culture (A)  Final    STAPHYLOCOCCUS AUREUS SUSCEPTIBILITIES PERFORMED ON PREVIOUS CULTURE WITHIN THE LAST 5 DAYS. Performed at [facility], [address] [place], [place] [address]    Report Status 02/03/2021 FINAL  Final  MRSA PCR Screening     Status: None   Collection Time: 01/31/21  2:28 PM   Specimen: Nasal Mucosa; Nasopharyngeal  Result Value Ref Range Status   MRSA by PCR NEGATIVE NEGATIVE Final    Comment:        The GeneXpert MRSA Assay (FDA approved for NASAL specimens only), is one component of a comprehensive MRSA colonization surveillance program. It is not intended to diagnose MRSA infection nor to guide or monitor treatment for MRSA infections. Performed at [facility], [address], [place], [place] [address]   Culture, blood (routine x 2)     Status: None (Preliminary result)   Collection Time: 02/01/21  2:20 PM   Specimen: BLOOD  Result Value Ref Range Status   Specimen Description   Final    BLOOD RIGHT ANTECUBITAL Performed at [facility], [address] [place], [place] [address]    Special Requests   Final    BOTTLES DRAWN AEROBIC AND ANAEROBIC Blood Culture  adequate volume Performed at [facility], [address] [place], [place] [address]  Culture   Final    NO GROWTH 2 DAYS Performed at [facility], [address], [place], [place] [address]    Report Status PENDING  Incomplete  Culture, blood (routine x 2)     Status: None (Preliminary result)   Collection Time: 02/01/21  2:20 PM   Specimen: BLOOD RIGHT HAND  Result Value Ref Range Status   Specimen Description   Final    BLOOD RIGHT HAND Performed at [facility], [address], [place], [place] [address]    Special Requests   Final    BOTTLES DRAWN AEROBIC AND ANAEROBIC Blood Culture adequate volume Performed at [facility], [address], [place], [place] [address]    Culture   Final    NO GROWTH 2 DAYS Performed at [facility], [address] [place], [place] [address]    Report Status PENDING  Incomplete  Aerobic/Anaerobic Culture w Gram Stain (surgical/deep wound)     Status: None (Preliminary result)   Collection Time: 02/02/21  4:12 PM   Specimen: Abscess  Result Value Ref Range Status   Specimen Description   Final    ABSCESS SUBSCAPULAR Performed at [facility], [address] [place], [place] [address]    Special Requests   Final    NONE Performed at [facility], [address] [place], [place] [address]    Gram Stain PENDING  Incomplete   Culture   Final    TOO YOUNG TO READ Performed at [facility], [address], [place], [place] [address]    Report Status PENDING  Incomplete     Radiology Studies: DG Shoulder Right  Result Date: 02/01/2021 CLINICAL DATA:  Bilateral shoulder pain without known injury. EXAM: RIGHT SHOULDER - 2+ VIEW COMPARISON:  None. FINDINGS: There is no evidence of fracture or dislocation. There is no evidence of arthropathy or other focal bone abnormality. Soft tissues are unremarkable. IMPRESSION: Negative.  Electronically Signed   By: Lupita Raider M.D.   On: 02/01/2021 16:44   MR THORACIC SPINE W WO CONTRAST  Result Date: 02/01/2021 CLINICAL DATA:  Thoracic region back pain. Recent fall. Question epidural abscess. Bacteremia. EXAM: MRI THORACIC WITHOUT AND WITH CONTRAST TECHNIQUE: Multiplanar and multiecho pulse sequences of the thoracic spine were obtained without and with intravenous contrast. CONTRAST:  9mL GADAVIST GADOBUTROL 1 MMOL/ML IV SOLN COMPARISON:  None. FINDINGS: Alignment:  No abnormal alignment. Vertebrae: In general, the vertebral bodies show loss of height from T5 through T12 is could possibly be seen with chronic osteopenia. No evidence of a focal or recent compression fracture. No sign of bone infection. Cord:  See below regarding stenosis. Paraspinal and other soft tissues: Extensive T2 bright material in the left upper chest wall in the supraclavicular region and between the ribcage and the scapula, with peripheral enhancement. This could be sterile material related to hematoma or could be an infection, either a primary infection or a superinfected hematoma. Consider chest CT for complete evaluation. Disc levels: T1-2 and T2-3: Normal T3-4: Small left posterolateral disc protrusion but without apparent neural compression. T4-5: Congenital spinal stenosis. Chronic appearing central disc herniation. Facet and ligamentous hypertrophy. Spinal stenosis with some cord deformity. T5-6: Congenital spinal stenosis. Chronic appearing broad-based disc herniation more prominent towards the right. Facet and ligamentous hypertrophy. Severe spinal stenosis with cord deformity. T6-7: Congenital spinal stenosis. Chronic appearing broad-based disc herniation. Facet and ligamentous hypertrophy. No visible compressive  effect upon the cord. T7-8: Congenital spinal stenosis. Chronic appearing broad-based disc herniation more prominent towards the left. Facet and ligamentous hypertrophy. Some deformity of the  ventral cord. T8-9: Congenital spinal stenosis. Broad-based disc herniation which could be relatively more recent. Facet and ligamentous prominence. Slight: Indentation of cord. T9-10 congenital spinal stenosis. Chronic appearing left posterolateral disc herniation with mild mass-effect upon the left side of the cord. T10-11: Congenital spinal stenosis. Chronic appearing shallow protrusion of the disc. Facet and ligamentous hypertrophy. Mild deformity of the cord. T11-12: Congenital spinal stenosis. Chronic appearing disc protrusion more prominent towards the right. Facet and ligamentous prominence. Some deformity of the cord. T12-L1: Chronic appearing bilateral posterolateral disc herniations that indent the thecal sac. No distinct neural compression. IMPRESSION: Fluid collection in the left upper posterior chest wall as outlined above that could be sterile hematoma, infected hematoma or abscess on related to hematoma. Consider chest CT with contrast for better evaluation of extent. 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 Stenosis with some cord deformity which could be significant in the region from T4-5 through T11-12 as outlined above. No evidence infection involving the spine or spinal canal. Electronically Signed   By: Paulina Fusi M.D.   On: 02/01/2021 14:20   CT CHEST ABDOMEN PELVIS W CONTRAST  Result Date: 02/01/2021 CLINICAL DATA:  Fall, left chest and abdominal pain, midthoracic spine tenderness to palpation. Gram-positive bacteremia. EXAM: CT CHEST, ABDOMEN, AND PELVIS WITH CONTRAST TECHNIQUE: Multidetector CT imaging of the chest, abdomen and pelvis was performed following the standard protocol during bolus administration of intravenous contrast. CONTRAST:  OMNIPAQUE IOHEXOL 300 MG/ML  SOLN COMPARISON:  None. FINDINGS: CT CHEST FINDINGS Cardiovascular: No significant coronary artery  calcification. Global cardiac size within normal limits. No pericardial effusion. Central pulmonary arteries are of normal caliber. Thoracic aorta is unremarkable. Mediastinum/Nodes: The thyroid gland is unremarkable. No pathologic thoracic adenopathy. The esophagus is unremarkable. Lungs/Pleura: A 7 mm peribronchial cavitary nodule is seen within the inferior lingula at axial image # 93/7 and additional 10 mm similar cavitary nodule is seen within the basilar lingula at axial image # 101/7. A similar tiny cavitary nodule is seen within the right mid lung zone at axial image # 77/7 in a centrilobular location. A 7 mm noncalcified pulmonary nodule may demonstrate central umbilication suggesting early cavitation, best seen on coronal image # 36/5 at axial image # 72/7. Among the differential considerations, these most likely represent septic emboli given their random distribution and additional findings listed below. No pneumothorax or pleural effusion. The central airways are widely patent. Musculoskeletal: No acute bone abnormality. No lytic or blastic bone lesions. No focal osseous erosions. The osseous structures are age-appropriate. There is a multiloculated intramuscular fluid collection identified within the a subscapular posterior left chest wall measuring at least 5.5 x 7.3 x 10.4 cm in size on axial image # 11/2 and sagittal reformat # 164/6 which extends into the suprascapular region superiorly. The inflammatory changes adjacent to this collection extend inferiorly to involve the left latissimus dorsi musculature where multiple punctate foci of gas are identified. Together, the findings are suspicious for a a intramuscular abscess and/or an infected hematoma. There is soft tissue infiltration within the left chest wall laterally which appears separate from this collection which may represent edema related to trauma in this acutely traumatized patient or a separate focus of inflammatory change. CT ABDOMEN  PELVIS FINDINGS Hepatobiliary: No focal liver abnormality is seen. No  gallstones, gallbladder wall thickening, or biliary dilatation. Pancreas: Unremarkable Spleen: Unremarkable Adrenals/Urinary Tract: Adrenal glands are unremarkable. Kidneys are normal, without renal calculi, focal lesion, or hydronephrosis. Bladder is unremarkable. Stomach/Bowel: Stomach is within normal limits. Appendix appears normal. No evidence of bowel wall thickening, distention, or inflammatory changes. No free intraperitoneal gas or fluid. Vascular/Lymphatic: Minimal atherosclerotic calcification within the abdominal aorta. No aortic aneurysm. No pathologic adenopathy within the abdomen and pelvis. Reproductive: Prostate is unremarkable. Other: Tiny fat containing umbilical hernia. There is diffuse subcutaneous edema involving the body wall and flanks bilaterally in keeping with mild anasarca. Musculoskeletal: No acute bone abnormality within the abdomen and pelvis. No lytic or blastic bone lesion. L3-S1 lumbar fusion with instrumentation and resection of the spinous processes of L4 and L5 has been performed. No loculated fluid collection is clearly identified within this region. IMPRESSION: Multiloculated fluid collection within the subscapular left posterior chest wall extending superiorly into the suprascapular region and medially just lateral to the spinous processes of T1-3. This appears contiguous with inflammatory changes involving the latissimus dorsi muscle where extensive foci of gas are identified and is suspicious for a a intramuscular abscess and/or infected hematoma. Fluid components superiorly within the suprascapular region or paraspinal region should be amenable to ultrasound-guided sampling for microbiology evaluation. Multiple small cavitary nodules within the mid and lower lung zones randomly distributed. Among the differential considerations, which include cystic metastases and Langerhans cell histiocytosis, the  associated findings listed above favor septic embolization as an etiology. These can be reassessed following completion of antibiotic therapy. Separate focus of inflammatory change within the left axilla possibly representing a focus of edema or hemorrhage related to trauma in this acutely traumatized patient. Alternatively, this may simply represent extension of the inflammatory process listed above. Electronically Signed   By: Helyn Numbers MD   On: 02/01/2021 20:00   IR US Guide Bx Asp/Drain  Result Date: 02/02/2021 INDICATION: Bacteremia and multiloculated fluid collection involving the left upper posterior chest wall. EXAM: ULTRASOUND-GUIDED ASPIRATION DRAINAGE OF LEFT POSTERIOR CHEST WALL ABSCESS MEDICATIONS: The patient is currently admitted to the hospital and receiving intravenous antibiotics. The antibiotics were administered within an appropriate time frame prior to the initiation of the procedure. ANESTHESIA/SEDATION: Moderate (conscious) sedation was employed during this procedure. A total of Versed 3.5 mg and Fentanyl 1 mcg was administered intravenously. Moderate Sedation Time: 14 minutes. The patient's level of consciousness and vital signs were monitored continuously by radiology nursing throughout the procedure under my direct supervision. COMPLICATIONS: None immediate. PROCEDURE: Informed written consent was obtained from the patient after a thorough discussion of the procedural risks, benefits and alternatives. All questions were addressed. Maximal Sterile Barrier Technique was utilized including caps, mask, sterile gowns, sterile gloves, sterile drape, hand hygiene and skin antiseptic. A timeout was performed prior to the initiation of the procedure. Ultrasound was utilized to localize a fluid collection in the soft tissues of the left upper posterior chest wall. Under ultrasound guidance, a 5 [place] Yueh centesis needle catheter was advanced into the fluid collection. Aspiration was  performed. A fluid sample was sent for culture analysis. Additional ultrasound was performed. FINDINGS: Fluid collection was able to be localized superficial to the posterior ribs and centered predominantly medial to the scapula on the left. Aspiration yielded purulent fluid. A total volume of approximately 60 mL of fluid was able to be aspirated from the collection resulting in significant decompression by ultrasound. IMPRESSION: Ultrasound-guided aspiration of left upper posterior chest wall abscess medial to the  scapula. 60 mL of fluid was able to be aspirated from a centesis catheter. A fluid sample was sent for culture analysis. Electronically Signed   By: Irish Lack M.D.   On: 02/02/2021 17:06   DG Shoulder Left  Result Date: 02/01/2021 CLINICAL DATA:  Bilateral shoulder pain without known injury. EXAM: LEFT SHOULDER - 2+ VIEW COMPARISON:  None. FINDINGS: There is no evidence of fracture or dislocation. There is no evidence of arthropathy or other focal bone abnormality. Soft tissues are unremarkable. IMPRESSION: Negative. Electronically Signed   By: Lupita Raider M.D.   On: 02/01/2021 16:43   ECHOCARDIOGRAM COMPLETE  Result Date: 02/03/2021    ECHOCARDIOGRAM REPORT   Patient Name:   DEX BLAKELY Date of Exam: 02/03/2021 Medical Rec #:  [id number]        Height:       70.0 in Accession #:    [id number]       Weight:       198.2 lb Date of Birth:  1965-11-21        BSA:          2.079 m Patient Age:    54 years         BP:           129/83 mmHg Patient Gender: M                HR:           116 bpm. Exam Location:  Inpatient Procedure: 2D Echo, Cardiac Doppler, Color Doppler and Intracardiac            Opacification Agent Indications:    Bacteremia R78.81  History:        Patient has no prior history of Echocardiogram examinations.                 Risk Factors:Hypertension and Diabetes. COVID-19.  Sonographer:    Elmarie Shiley Dance Referring Phys: [id number] Laycie Schriner IMPRESSIONS  1. Left ventricular  ejection fraction, by estimation, is 55 to 60%. The left ventricle has normal function. The left ventricle has no regional wall motion abnormalities. There is mild left ventricular hypertrophy. Left ventricular diastolic parameters were normal.  2. Right ventricular systolic function is normal. The right ventricular size is normal.  3. The mitral valve is normal in structure. No evidence of mitral valve regurgitation. No evidence of mitral stenosis.  4. The aortic valve is tricuspid. There is mild calcification of the aortic valve. Aortic valve regurgitation is not visualized. Mild aortic valve sclerosis is present, with no evidence of aortic valve stenosis.  5. Aortic dilatation noted. There is mild dilatation of the ascending aorta, measuring 38 mm.  6. The inferior vena cava is normal in size with greater than 50% respiratory variability, suggesting right atrial pressure of 3 mmHg. FINDINGS  Left Ventricle: Left ventricular ejection fraction, by estimation, is 55 to 60%. The left ventricle has normal function. The left ventricle has no regional wall motion abnormalities. Definity contrast agent was given IV to delineate the left ventricular  endocardial borders. The left ventricular internal cavity size was normal in size. There is mild left ventricular hypertrophy. Left ventricular diastolic parameters were normal. Right Ventricle: The right ventricular size is normal. No increase in right ventricular wall thickness. Right ventricular systolic function is normal. Left Atrium: Left atrial size was normal in size. Right Atrium: Right atrial size was normal in size. Pericardium: There is no evidence of pericardial effusion. Mitral Valve: The  mitral valve is normal in structure. No evidence of mitral valve regurgitation. No evidence of mitral valve stenosis. Tricuspid Valve: The tricuspid valve is normal in structure. Tricuspid valve regurgitation is mild . No evidence of tricuspid stenosis. Aortic Valve: The aortic  valve is tricuspid. There is mild calcification of the aortic valve. Aortic valve regurgitation is not visualized. Mild aortic valve sclerosis is present, with no evidence of aortic valve stenosis. Pulmonic Valve: The pulmonic valve was normal in structure. Pulmonic valve regurgitation is not visualized. No evidence of pulmonic stenosis. Aorta: The aortic root is normal in size and structure and aortic dilatation noted. There is mild dilatation of the ascending aorta, measuring 38 mm. Venous: The inferior vena cava is normal in size with greater than 50% respiratory variability, suggesting right atrial pressure of 3 mmHg. IAS/Shunts: No atrial level shunt detected by color flow Doppler.  LEFT VENTRICLE PLAX 2D LVIDd:         4.60 cm  Diastology LVIDs:         2.60 cm  LV e' medial:    7.94 cm/s LV PW:         1.20 cm  LV E/e' medial:  5.9 LV IVS:        1.20 cm  LV e' lateral:   10.20 cm/s LVOT diam:     2.40 cm  LV E/e' lateral: 4.6 LV SV:         51 LV SV Index:   24 LVOT Area:     4.52 cm  RIGHT VENTRICLE             IVC RV S prime:     16.20 cm/s  IVC diam: 2.00 cm TAPSE (M-mode): 1.9 cm LEFT ATRIUM             Index LA diam:        3.50 cm 1.68 cm/m LA Vol (A2C):   30.3 ml 14.57 ml/m LA Vol (A4C):   45.6 ml 21.93 ml/m LA Biplane Vol: 39.1 ml 18.80 ml/m  AORTIC VALVE LVOT Vmax:   86.30 cm/s LVOT Vmean:  56.900 cm/s LVOT VTI:    0.112 m  AORTA Ao Root diam: 4.00 cm Ao Asc diam:  3.80 cm MITRAL VALVE MV Area (PHT): 4.60 cm    SHUNTS MV Decel Time: 165 msec    Systemic VTI:  0.11 m MV E velocity: 47.10 cm/s  Systemic Diam: 2.40 cm MV A velocity: 48.40 cm/s MV E/A ratio:  0.97 Charlton HawsPeter Nishan MD Electronically signed by Charlton HawsPeter Nishan MD Signature Date/Time: 02/03/2021/11:37:09 AM    Final    IR US Guide Bx Asp/Drain  Final Result    CT CHEST ABDOMEN PELVIS W CONTRAST  Final Result    DG Shoulder Left  Final Result    DG Shoulder Right  Final Result    MR THORACIC SPINE W WO CONTRAST  Final Result     DG Chest Port 1 View  Final Result      Scheduled Meds:  Chlorhexidine Gluconate Cloth  6 each Topical Daily   enoxaparin (LOVENOX) injection  40 mg Subcutaneous Q24H   gabapentin  800 mg Oral TID   insulin aspart  0-15 Units Subcutaneous TID WC   insulin aspart  0-5 Units Subcutaneous QHS   insulin glargine  10 Units Subcutaneous Daily   mouth rinse  15 mL Mouth Rinse BID   oxyCODONE  20 mg Oral Q12H   PRN Meds: acetaminophen, alum & mag hydroxide-simeth, docusate  sodium, methocarbamol, morphine injection, ondansetron (ZOFRAN) IV, oxyCODONE, traZODone Continuous Infusions:  sodium chloride 75 mL/hr at 02/03/21 1102    ceFAZolin (ANCEF) IV Stopped (02/03/21 0603)     LOS: 3 days  Time spent: Greater than 50% of the 35 minute visit was spent in counseling/coordination of care for the patient as laid out in the A&P.   Lewie Chamber, MD [facility] 02/03/2021, 1:40 PM

## 2021-02-03 NOTE — Progress Notes (Signed)
  Echocardiogram 2D Echocardiogram has been performed.  John Ware G John Ware 02/03/2021, 11:11 AM

## 2021-02-03 NOTE — Assessment & Plan Note (Addendum)
-   TTE on 6/10 notes mild dilation of ascending aorta, 38 mm - follow TEE results as well: normal thoracic and ascending aorta noted on 02/05/21

## 2021-02-04 ENCOUNTER — Inpatient Hospital Stay (HOSPITAL_COMMUNITY): Payer: Commercial Managed Care - PPO

## 2021-02-04 DIAGNOSIS — R7881 Bacteremia: Secondary | ICD-10-CM | POA: Diagnosis not present

## 2021-02-04 DIAGNOSIS — A419 Sepsis, unspecified organism: Secondary | ICD-10-CM | POA: Diagnosis not present

## 2021-02-04 DIAGNOSIS — H539 Unspecified visual disturbance: Secondary | ICD-10-CM | POA: Diagnosis not present

## 2021-02-04 DIAGNOSIS — R6520 Severe sepsis without septic shock: Secondary | ICD-10-CM | POA: Diagnosis not present

## 2021-02-04 LAB — GLUCOSE, CAPILLARY
Glucose-Capillary: 192 mg/dL — ABNORMAL HIGH (ref 70–99)
Glucose-Capillary: 216 mg/dL — ABNORMAL HIGH (ref 70–99)
Glucose-Capillary: 183 mg/dL — ABNORMAL HIGH (ref 70–99)
Glucose-Capillary: 233 mg/dL — ABNORMAL HIGH (ref 70–99)

## 2021-02-04 LAB — LACTIC ACID, PLASMA: Lactic Acid, Venous: 1.5 mmol/L (ref 0.5–1.9)

## 2021-02-04 LAB — CBC WITH DIFFERENTIAL/PLATELET
Abs Immature Granulocytes: 1.08 10*3/uL — ABNORMAL HIGH (ref 0.00–0.07)
Basophils Absolute: 0 10*3/uL (ref 0.0–0.1)
Basophils Relative: 0 %
Eosinophils Absolute: 0.1 10*3/uL (ref 0.0–0.5)
Eosinophils Relative: 0 %
HCT: 32.4 % — ABNORMAL LOW (ref 39.0–52.0)
Hemoglobin: 11.6 g/dL — ABNORMAL LOW (ref 13.0–17.0)
Immature Granulocytes: 7 %
Lymphocytes Relative: 8 %
Lymphs Abs: 1.3 10*3/uL (ref 0.7–4.0)
MCH: 30.5 pg (ref 26.0–34.0)
MCHC: 35.8 g/dL (ref 30.0–36.0)
MCV: 85.3 fL (ref 80.0–100.0)
Monocytes Absolute: 0.6 10*3/uL (ref 0.1–1.0)
Monocytes Relative: 4 %
Neutro Abs: 12.3 10*3/uL — ABNORMAL HIGH (ref 1.7–7.7)
Neutrophils Relative %: 81 %
Platelets: 429 10*3/uL — ABNORMAL HIGH (ref 150–400)
RBC: 3.8 MIL/uL — ABNORMAL LOW (ref 4.22–5.81)
RDW: 13.9 % (ref 11.5–15.5)
WBC: 15.3 10*3/uL — ABNORMAL HIGH (ref 4.0–10.5)
nRBC: 0 % (ref 0.0–0.2)

## 2021-02-04 LAB — MAGNESIUM: Magnesium: 2.2 mg/dL (ref 1.7–2.4)

## 2021-02-04 LAB — CULTURE, BLOOD (ROUTINE X 2): Special Requests: ADEQUATE

## 2021-02-04 LAB — BASIC METABOLIC PANEL
Anion gap: 8 (ref 5–15)
BUN: 9 mg/dL (ref 6–20)
CO2: 25 mmol/L (ref 22–32)
Calcium: 7.5 mg/dL — ABNORMAL LOW (ref 8.9–10.3)
Chloride: 92 mmol/L — ABNORMAL LOW (ref 98–111)
Creatinine, Ser: 0.49 mg/dL — ABNORMAL LOW (ref 0.61–1.24)
GFR, Estimated: >60 mL/min (ref >60)
Glucose, Bld: 183 mg/dL — ABNORMAL HIGH (ref 70–99)
Potassium: 3.6 mmol/L (ref 3.5–5.1)
Sodium: 125 mmol/L — ABNORMAL LOW (ref 135–145)

## 2021-02-04 LAB — COMPREHENSIVE METABOLIC PANEL
ALT: 21 U/L (ref 0–44)
AST: 43 U/L — ABNORMAL HIGH (ref 15–41)
Albumin: 1.6 g/dL — ABNORMAL LOW (ref 3.5–5.0)
Alkaline Phosphatase: 95 U/L (ref 38–126)
Anion gap: 10 (ref 5–15)
BUN: 8 mg/dL (ref 6–20)
CO2: 24 mmol/L (ref 22–32)
Calcium: 7.5 mg/dL — ABNORMAL LOW (ref 8.9–10.3)
Chloride: 88 mmol/L — ABNORMAL LOW (ref 98–111)
Creatinine, Ser: 0.35 mg/dL — ABNORMAL LOW (ref 0.61–1.24)
GFR, Estimated: >60 mL/min (ref >60)
Glucose, Bld: 165 mg/dL — ABNORMAL HIGH (ref 70–99)
Potassium: 4.1 mmol/L (ref 3.5–5.1)
Sodium: 122 mmol/L — ABNORMAL LOW (ref 135–145)
Total Bilirubin: 1.1 mg/dL (ref 0.3–1.2)
Total Protein: 5.1 g/dL — ABNORMAL LOW (ref 6.5–8.1)

## 2021-02-04 LAB — PHOSPHORUS: Phosphorus: 2.9 mg/dL (ref 2.5–4.6)

## 2021-02-04 MED ORDER — POLYETHYLENE GLYCOL 3350 17 G PO PACK
17.0000 g | PACK | Freq: Every day | ORAL | Status: DC
  Administered 2021-02-04 – 2021-03-15 (×24): 17 g via ORAL
  Filled 2021-02-04 (×32): qty 1

## 2021-02-04 MED ORDER — SENNOSIDES-DOCUSATE SODIUM 8.6-50 MG PO TABS
1.0000 | ORAL_TABLET | Freq: Two times a day (BID) | ORAL | Status: DC
  Administered 2021-02-04 – 2021-03-15 (×67): 1 via ORAL
  Filled 2021-02-04 (×71): qty 1

## 2021-02-04 NOTE — Progress Notes (Signed)
Inpatient Rehab Admissions Coordinator:   Following for CIR. I spoke with Pt.s wife who states interest and confirms that the can provide 24/7 support. I will follow for potential admit pending insurance auth, bed availability, and medical readiness. Case opened with [facility] on Friday.  Megan Salon, MS, CCC-SLP Rehab Admissions Coordinator  [phone] (celll) [phone] (office)

## 2021-02-04 NOTE — Progress Notes (Signed)
02/04/2021 Patient states he has noticed black specks floating in his vision. He thinks it is new since the IR procedure yesterday. Not constant, just intermittent. Notified on-call MD; will be addressed by day team, per MD response. Cindy S. Clelia Croft BSN, RN, CCRP 02/04/2021 2:23 AM

## 2021-02-04 NOTE — Assessment & Plan Note (Addendum)
-   Patient now stating on 02/04/2021 that he has been seeing "black floaters" in his field of vision for several days.  When questioned why he did not bring this up sooner, he did not have a good answer and did not want to "trouble [place]" - Etiology unclear but concern would be for septic emboli given CT chest also concerning for septic emboli to the lungs - MRI brain ordered: punctate acute or subacute infarct vs artifact in high left frontal lobe. This would not affect his vision as he is noting.  -Evaluated by ophthalmology, 02/05/2021.  No endophthalmitis

## 2021-02-04 NOTE — Progress Notes (Signed)
Progress Note    John Ware   ZOX:[id number]  DOB: 10/26/1965  DOA: 01/31/2021     4  PCP: Junie SpencerHawks, Christy A, FNP  CC: Weakness, ongoing pain  Hospital Course: John Ware is a 55 y.o. male with medical history significant of chronic pain, HTN, DM2. Presenting with weakness. History is from wife as the patient is a poor historian.  She reports that the patient has been generally unwell over the past 3 weeks or so. He had a positive home COVID test on 01/12/21. He had no respiratory symptoms at the time. He just had fevers that were responsive to APAP. He self isolated at home until 01/20/21 when he was at a party. He apparently fell out of a chair and hurt his left shoulder. He was evaluated in the ED and they found no fracture. He was given a sling and told to follow up with orthopedics.  Since that time, the wife reports that he's had general body aches/pain that have increased and are not responsive to his regular chronic pain meds. He has had a generally poor appetite during this time. She reports that he's been unable to walk for the last 2 days. These constellation of symptoms were concerning, so she brought him to the ED.    He was found to have a sodium of 113, a glucose of 339, a WBC of 20.5, and a lactic acid of 3.8. He was started on broad spec abx and given fluids.  Soon after admission, blood cultures became positive for 4/4 bottles MSSA.  Antibiotics were transitioned to Ancef.  With further work-up and collateral information he was found to have a large fluid collection involving his left lateral and posterior chest/back.  He also had tenderness in his mid back.  He underwent MRI T-spine and CT chest/abdomen/pelvis.  There was no evidence of osteomyelitis or discitis or other underlying infection on MRI T-spine.  CT chest showed large multiloculated fluid collection.  IR was consulted and he underwent ultrasound guided aspiration which was sent for culturing.  CT chest was  also concerning for evidence of septic emboli.  2D echo did not reveal any evidence of endocarditis.  Cardiology was consulted for a TEE per ID recommendations as well.  Interval History:  Overnight he noted to staff about black floaters in his field of vision.  Questioning him why he did not bring it up sooner if the symptoms had been going on at home prior to presentation but he felt like he did not want to travel staff with his complaints. Otherwise no other changes since yesterday.  Still has pain and limited range of motion in his shoulders.  ROS: Constitutional: positive for fatigue and malaise, negative for night sweats, Respiratory: negative for cough and sputum, Cardiovascular: negative for chest pain, and Gastrointestinal: negative for abdominal pain  Assessment & Plan: * Severe sepsis (HCC)-resolved as of 02/04/2021 - Fever, tachycardia, tachypnea, leukocytosis, lactic acidosis - possible infected hematoma/fluid collection from fall (follow up aspiration culture); urine growing SA but likely is seeded from blood too - 4/4 blood cultures on admission positive for MSSA -Continue Ancef for now  Bacteremia due to Staphylococcus aureus - 4/4 blood cultures on admission growing MSSA -Repeated blood cultures on 02/01/2021; NGTD - echo negative for vegetations; cardiology consulted for TEE, tentatively planned for 02/05/21 - now s/p IR drainage of left posterior chest wall fluid collection which was noted to be purulent in nature; follow up culture results - MRI T  spine 02/01/21 negative for OM/discitis  - continue ancef - ID following  Hyponatremia - Presumed due to dehydration and hypokalemia - severely low on admission, 113 - Continue fluids; adjusting as needed - s/p 50 cc 3% NS bolus on 02/01/21 - continue trending BMP - Na improved but still not stable without fluids  Left shoulder pain - See work-up above - Was previously seen in the ER after his fall and recommended for a sling  with outpatient Ortho follow-up -Bilateral shoulder x-rays unremarkable for now.  If aspiration from chest wall fluid collection unremarkable, may need to further work-up his shoulders with MRI  Vision changes - Patient now stating on 02/04/2021 that he has been seeing "black floaters" in his field of vision for several days.  When questioned why he did not bring this up sooner, he did not have a good answer and did not want to "trouble [place]" - Etiology unclear but concern would be for septic emboli given CT chest also concerning for septic emboli to the lungs - MRI brain ordered: punctate acute or subacute infarct vs artifact in high left frontal lobe. This would not affect his vision as he is noting. He may need ophtho referral at discharge   DKA (diabetic ketoacidosis) (HCC)-resolved as of 02/02/2021 - present on admission - responded to fluids and insulin - now resolved  Ascending aorta dilatation (HCC) - TTE on 6/10 notes mild dilation of ascending aorta, 38 mm - follow TEE results as well   Anxiety - Wellbutrin and Zoloft on hold for now in setting of hyponatremia  HTN (hypertension) - Pressure normal on admission.  Will treat if needed  Diabetes mellitus (HCC) - Continue SSI and CBG monitoring -Continue Lantus - A1c 10.2% on 01/31/2021   Old records reviewed in assessment of this patient  Antimicrobials: Cefepime 01/31/21 x 1 Rocephin 01/31/21 x 1 Vanc 01/31/21 x 1 Ancef 02/01/21 >> current  DVT prophylaxis: enoxaparin (LOVENOX) injection 40 mg Start: 01/31/21 2200   Code Status:   Code Status: Full Code Family Communication:   Disposition Plan: Status is: Inpatient  Remains inpatient appropriate because:Ongoing diagnostic testing needed not appropriate for outpatient work up, IV treatments appropriate due to intensity of illness or inability to take PO, and Inpatient level of care appropriate due to severity of illness  Dispo: The patient is from: Home               Anticipated d/c is to:  Possibly CIR              Patient currently is not medically stable to d/c.   Difficult to place patient No  Risk of unplanned readmission score: Unplanned Admission- Pilot do not use: 18.81   Objective: Blood pressure 133/69, pulse 96, temperature 98.7 F (37.1 C), temperature source Oral, resp. rate (!) 28, height  (1.778 m), weight 89.9 kg, SpO2 94 %.  Examination: General appearance: alert, cooperative, no distress, and slowed mentation Head: Normocephalic, without obvious abnormality, atraumatic Eyes:  EOMI; gross field of vision is intact Back:  left back noted with large ~10 area of swelling; no erythema, calor Lungs: clear to auscultation bilaterally Chest wall:  mild left chest TTP; swelling from back wraps to left lateral chest wall Heart: regular rate and rhythm, S1, S2 normal, and unable to appreciate any murmurs Abdomen: normal findings: bowel sounds normal and soft, non-tender Extremities:  no edema Skin: mobility and turgor normal Neurologic: Grossly normal  Consultants:  ID  Procedures:  IR  drainage of back fluid collection, 6/10  Data Reviewed: I have personally reviewed following labs and imaging studies Results for orders placed or performed during the hospital encounter of 01/31/21 (from the past 24 hour(s))  Basic metabolic panel     Status: Abnormal   Collection Time: 02/03/21  6:16 PM  Result Value Ref Range   Sodium 121 (L) 135 - 145 mmol/L   Potassium 3.7 3.5 - 5.1 mmol/L   Chloride 88 (L) 98 - 111 mmol/L   CO2 23 22 - 32 mmol/L   Glucose, Bld 181 (H) 70 - 99 mg/dL   BUN 10 6 - 20 mg/dL   Creatinine, Ser 4.09 0.61 - 1.24 mg/dL   Calcium 7.5 (L) 8.9 - 10.3 mg/dL   GFR, Estimated >81 >19 mL/min   Anion gap 10 5 - 15  Glucose, capillary     Status: Abnormal   Collection Time: 02/03/21  9:01 PM  Result Value Ref Range   Glucose-Capillary 187 (H) 70 - 99 mg/dL  Comprehensive metabolic panel     Status: Abnormal    Collection Time: 02/04/21  3:00 AM  Result Value Ref Range   Sodium 122 (L) 135 - 145 mmol/L   Potassium 4.1 3.5 - 5.1 mmol/L   Chloride 88 (L) 98 - 111 mmol/L   CO2 24 22 - 32 mmol/L   Glucose, Bld 165 (H) 70 - 99 mg/dL   BUN 8 6 - 20 mg/dL   Creatinine, Ser 1.47 (L) 0.61 - 1.24 mg/dL   Calcium 7.5 (L) 8.9 - 10.3 mg/dL   Total Protein 5.1 (L) 6.5 - 8.1 g/dL   Albumin 1.6 (L) 3.5 - 5.0 g/dL   AST 43 (H) 15 - 41 U/L   ALT 21 0 - 44 U/L   Alkaline Phosphatase 95 38 - 126 U/L   Total Bilirubin 1.1 0.3 - 1.2 mg/dL   GFR, Estimated >82 >95 mL/min   Anion gap 10 5 - 15  CBC with Differential/Platelet     Status: Abnormal   Collection Time: 02/04/21  3:05 AM  Result Value Ref Range   WBC 15.3 (H) 4.0 - 10.5 K/uL   RBC 3.80 (L) 4.22 - 5.81 MIL/uL   Hemoglobin 11.6 (L) 13.0 - 17.0 g/dL   HCT 62.1 (L) 30.8 - 65.7 %   MCV 85.3 80.0 - 100.0 fL   MCH 30.5 26.0 - 34.0 pg   MCHC 35.8 30.0 - 36.0 g/dL   RDW 84.6 96.2 - 95.2 %   Platelets 429 (H) 150 - 400 K/uL   nRBC 0.0 0.0 - 0.2 %   Neutrophils Relative % 81 %   Neutro Abs 12.3 (H) 1.7 - 7.7 K/uL   Lymphocytes Relative 8 %   Lymphs Abs 1.3 0.7 - 4.0 K/uL   Monocytes Relative 4 %   Monocytes Absolute 0.6 0.1 - 1.0 K/uL   Eosinophils Relative 0 %   Eosinophils Absolute 0.1 0.0 - 0.5 K/uL   Basophils Relative 0 %   Basophils Absolute 0.0 0.0 - 0.1 K/uL   WBC Morphology DOHLE BODIES    Immature Granulocytes 7 %   Abs Immature Granulocytes 1.08 (H) 0.00 - 0.07 K/uL   Target Cells PRESENT   Magnesium     Status: None   Collection Time: 02/04/21  3:05 AM  Result Value Ref Range   Magnesium 2.2 1.7 - 2.4 mg/dL  Phosphorus     Status: None   Collection Time: 02/04/21  3:05 AM  Result  Value Ref Range   Phosphorus 2.9 2.5 - 4.6 mg/dL  Lactic acid, plasma     Status: None   Collection Time: 02/04/21  7:39 AM  Result Value Ref Range   Lactic Acid, Venous 1.5 0.5 - 1.9 mmol/L  Glucose, capillary     Status: Abnormal   Collection Time:  02/04/21  8:00 AM  Result Value Ref Range   Glucose-Capillary 192 (H) 70 - 99 mg/dL  Glucose, capillary     Status: Abnormal   Collection Time: 02/04/21 12:23 PM  Result Value Ref Range   Glucose-Capillary 216 (H) 70 - 99 mg/dL    Recent Results (from the past 240 hour(s))  Urine culture     Status: Abnormal   Collection Time: 01/31/21  8:51 AM   Specimen: In/Out Cath Urine  Result Value Ref Range Status   Specimen Description   Final    IN/OUT CATH URINE Performed at [facility], [address] [place], [place] [address]    Special Requests   Final    NONE Performed at [facility], [address], [place], [place] [address]    Culture >=100,000 COLONIES/mL STAPHYLOCOCCUS AUREUS (A)  Final   Report Status 02/02/2021 FINAL  Final   Organism ID, Bacteria STAPHYLOCOCCUS AUREUS (A)  Final      Susceptibility   Staphylococcus aureus - MIC*    CIPROFLOXACIN >=8 RESISTANT Resistant     GENTAMICIN <=0.5 SENSITIVE Sensitive     NITROFURANTOIN <=16 SENSITIVE Sensitive     OXACILLIN 0.5 SENSITIVE Sensitive     TETRACYCLINE <=1 SENSITIVE Sensitive     VANCOMYCIN 1 SENSITIVE Sensitive     TRIMETH/SULFA <=10 SENSITIVE Sensitive     CLINDAMYCIN <=0.25 SENSITIVE Sensitive     RIFAMPIN <=0.5 SENSITIVE Sensitive     Inducible Clindamycin NEGATIVE Sensitive     * >=100,000 COLONIES/mL STAPHYLOCOCCUS AUREUS  Resp Panel by RT-PCR (Flu A&B, Covid) Nasopharyngeal Swab     Status: Abnormal   Collection Time: 01/31/21 10:59 AM   Specimen: Nasopharyngeal Swab; Nasopharyngeal(NP) swabs in vial transport medium  Result Value Ref Range Status   SARS Coronavirus 2 by RT PCR POSITIVE (A) NEGATIVE Final    Comment: RESULT CALLED TO, READ BACK BY AND VERIFIED WITH: GARRISON,G. RN AT 1206 01/31/21 MULLINS,T (NOTE) SARS-CoV-2 target nucleic acids are DETECTED.  The SARS-CoV-2 RNA is generally detectable in upper respiratory specimens during the acute phase of  infection. Positive results are indicative of the presence of the identified virus, but do not rule out bacterial infection or co-infection with other pathogens not detected by the test. Clinical correlation with patient history and other diagnostic information is necessary to determine patient infection status. The expected result is Negative.  Fact Sheet for Patients: BloggerCourse.com  Fact Sheet for Healthcare Providers: SeriousBroker.it  This test is not yet approved or cleared by the [place] FDA and  has been authorized for detection and/or diagnosis of SARS-CoV-2 by FDA under an Emergency Use Authorization (EUA).  This EUA will remain in effect (meaning this tes t can be used) for the duration of  the COVID-19 declaration under Section 564(b)(1) of the Act, 21 U.S.C. section 360bbb-3(b)(1), unless the authorization is terminated or revoked sooner.     Influenza A by PCR NEGATIVE NEGATIVE Final   Influenza B by PCR NEGATIVE NEGATIVE Final    Comment: (NOTE) The Xpert Xpress SARS-CoV-2/FLU/RSV plus assay is intended as an aid in the diagnosis of influenza from Nasopharyngeal swab  specimens and should not be used as a sole basis for treatment. Nasal washings and aspirates are unacceptable for Xpert Xpress SARS-CoV-2/FLU/RSV testing.  Fact Sheet for Patients: BloggerCourse.com  Fact Sheet for Healthcare Providers: SeriousBroker.it  This test is not yet approved or cleared by the [place] FDA and has been authorized for detection and/or diagnosis of SARS-CoV-2 by FDA under an Emergency Use Authorization (EUA). This EUA will remain in effect (meaning this test can be used) for the duration of the COVID-19 declaration under Section 564(b)(1) of the Act, 21 U.S.C. section 360bbb-3(b)(1), unless the authorization is terminated or revoked.  Performed at [facility], [address], [place], [place] [address]   Blood Culture (routine x 2)     Status: Abnormal   Collection Time: 01/31/21 10:59 AM   Specimen: BLOOD  Result Value Ref Range Status   Specimen Description   Final    BLOOD LEFT ANTECUBITAL Performed at [facility], [address], [place], [place] [address]    Special Requests   Final    BOTTLES DRAWN AEROBIC AND ANAEROBIC Blood Culture adequate volume Performed at [facility], [address], [place], [place] [address]    Culture  Setup Time   Final    GRAM POSITIVE COCCI IN CLUSTERS IN BOTH AEROBIC AND ANAEROBIC BOTTLES CRITICAL RESULT CALLED TO, READ BACK BY AND VERIFIED WITH: PHARMD MICHELLE L. [id number] FCP Performed at [facility], [address], [place], [place] [address]    Culture STAPHYLOCOCCUS AUREUS (A)  Final   Report Status 02/04/2021 FINAL  Final   Organism ID, Bacteria STAPHYLOCOCCUS AUREUS  Final      Susceptibility   Staphylococcus aureus - MIC*    CIPROFLOXACIN >=8 RESISTANT Resistant     ERYTHROMYCIN >=8 RESISTANT Resistant     GENTAMICIN <=0.5 SENSITIVE Sensitive     OXACILLIN 0.5 SENSITIVE Sensitive     TETRACYCLINE <=1 SENSITIVE Sensitive     VANCOMYCIN <=0.5 SENSITIVE Sensitive     TRIMETH/SULFA <=10 SENSITIVE Sensitive     CLINDAMYCIN <=0.25 SENSITIVE Sensitive     RIFAMPIN <=0.5 SENSITIVE Sensitive     Inducible Clindamycin NEGATIVE Sensitive     * STAPHYLOCOCCUS AUREUS  Blood Culture ID Panel (Reflexed)     Status: Abnormal   Collection Time: 01/31/21 10:59 AM  Result Value Ref Range Status   Enterococcus faecalis NOT DETECTED NOT DETECTED Final   Enterococcus Faecium NOT DETECTED NOT DETECTED Final   Listeria monocytogenes NOT DETECTED NOT DETECTED Final   Staphylococcus species DETECTED (A) NOT DETECTED Final    Comment: CRITICAL RESULT CALLED TO, READ BACK BY AND VERIFIED WITH: PHARMD MICHELLE L. [id number] FCP     Staphylococcus aureus (BCID) DETECTED (A) NOT DETECTED Final    Comment: CRITICAL RESULT CALLED TO, READ BACK BY AND VERIFIED WITH: PHARMD MICHELLE L. [id number] FCP    Staphylococcus epidermidis NOT DETECTED NOT DETECTED Final   Staphylococcus lugdunensis NOT DETECTED NOT DETECTED Final   Streptococcus species NOT DETECTED NOT DETECTED Final   Streptococcus agalactiae NOT DETECTED NOT DETECTED Final   Streptococcus pneumoniae NOT DETECTED NOT DETECTED Final   Streptococcus pyogenes NOT DETECTED NOT DETECTED Final   A.calcoaceticus-baumannii NOT DETECTED NOT DETECTED Final   Bacteroides fragilis NOT DETECTED NOT DETECTED Final   Enterobacterales NOT DETECTED NOT DETECTED Final   Enterobacter cloacae complex NOT DETECTED NOT DETECTED Final   Escherichia coli NOT DETECTED NOT DETECTED Final   Klebsiella  aerogenes NOT DETECTED NOT DETECTED Final   Klebsiella oxytoca NOT DETECTED NOT DETECTED Final   Klebsiella pneumoniae NOT DETECTED NOT DETECTED Final   Proteus species NOT DETECTED NOT DETECTED Final   Salmonella species NOT DETECTED NOT DETECTED Final   Serratia marcescens NOT DETECTED NOT DETECTED Final   Haemophilus influenzae NOT DETECTED NOT DETECTED Final   Neisseria meningitidis NOT DETECTED NOT DETECTED Final   Pseudomonas aeruginosa NOT DETECTED NOT DETECTED Final   Stenotrophomonas maltophilia NOT DETECTED NOT DETECTED Final   Candida albicans NOT DETECTED NOT DETECTED Final   Candida auris NOT DETECTED NOT DETECTED Final   Candida glabrata NOT DETECTED NOT DETECTED Final   Candida krusei NOT DETECTED NOT DETECTED Final   Candida parapsilosis NOT DETECTED NOT DETECTED Final   Candida tropicalis NOT DETECTED NOT DETECTED Final   Cryptococcus neoformans/gattii NOT DETECTED NOT DETECTED Final   Meth resistant mecA/C and MREJ NOT DETECTED NOT DETECTED Final    Comment: Performed at [facility], [address] [place], [place] [address]  Blood Culture (routine x 2)      Status: Abnormal   Collection Time: 01/31/21 11:15 AM   Specimen: BLOOD  Result Value Ref Range Status   Specimen Description   Final    BLOOD RIGHT ANTECUBITAL Performed at [facility], [address] [place], [place] [address]    Special Requests   Final    BOTTLES DRAWN AEROBIC AND ANAEROBIC Blood Culture adequate volume Performed at [facility], [address], [place], [place] [address]    Culture  Setup Time   Final    GRAM POSITIVE COCCI IN CLUSTERS IN BOTH AEROBIC AND ANAEROBIC BOTTLES CRITICAL VALUE NOTED.  VALUE IS CONSISTENT WITH PREVIOUSLY REPORTED AND CALLED VALUE.    Culture (A)  Final    STAPHYLOCOCCUS AUREUS SUSCEPTIBILITIES PERFORMED ON PREVIOUS CULTURE WITHIN THE LAST 5 DAYS. Performed at [facility], [address], [place], [place] [address]    Report Status 02/03/2021 FINAL  Final  MRSA PCR Screening     Status: None   Collection Time: 01/31/21  2:28 PM   Specimen: Nasal Mucosa; Nasopharyngeal  Result Value Ref Range Status   MRSA by PCR NEGATIVE NEGATIVE Final    Comment:        The GeneXpert MRSA Assay (FDA approved for NASAL specimens only), is one component of a comprehensive MRSA colonization surveillance program. It is not intended to diagnose MRSA infection nor to guide or monitor treatment for MRSA infections. Performed at [facility], [address] [place], [place] [address]   Culture, blood (routine x 2)     Status: None (Preliminary result)   Collection Time: 02/01/21  2:20 PM   Specimen: BLOOD  Result Value Ref Range Status   Specimen Description   Final    BLOOD RIGHT ANTECUBITAL Performed at [facility], [address] [place], [place] [address]    Special Requests   Final    BOTTLES DRAWN AEROBIC AND ANAEROBIC Blood Culture adequate volume Performed at [facility], [address] [place], [place] [address]    Culture    Final    NO GROWTH 3 DAYS Performed at [facility], [address] [place], [place] [address]    Report Status PENDING  Incomplete  Culture, blood (routine x 2)     Status: None (Preliminary result)   Collection Time: 02/01/21  2:20 PM  Specimen: BLOOD RIGHT HAND  Result Value Ref Range Status   Specimen Description   Final    BLOOD RIGHT HAND Performed at [facility], [address] [place], [place] [address]    Special Requests   Final    BOTTLES DRAWN AEROBIC AND ANAEROBIC Blood Culture adequate volume Performed at [facility], [address], [place], [place] [address]    Culture   Final    NO GROWTH 3 DAYS Performed at [facility], [address] [place], [place] [address]    Report Status PENDING  Incomplete  Aerobic/Anaerobic Culture w Gram Stain (surgical/deep wound)     Status: None (Preliminary result)   Collection Time: 02/02/21  4:12 PM   Specimen: Abscess  Result Value Ref Range Status   Specimen Description   Final    ABSCESS SUBSCAPULAR Performed at [facility], [address] [place], [place] [address]    Special Requests   Final    NONE Performed at [facility], [address] [place], [place] [address]    Gram Stain   Final    ABUNDANT WBC PRESENT, PREDOMINANTLY PMN ABUNDANT GRAM POSITIVE COCCI IN CLUSTERS IN TETRADS Performed at [facility], [address] [place], [place] [address]    Culture   Final    ABUNDANT STAPHYLOCOCCUS AUREUS SUSCEPTIBILITIES TO FOLLOW NO ANAEROBES ISOLATED; CULTURE IN PROGRESS FOR 5 DAYS    Report Status PENDING  Incomplete     Radiology Studies: MR BRAIN WO CONTRAST  Result Date: 02/04/2021 CLINICAL DATA:  Floaters in both eyes, MSSA bacteremia EXAM: MRI HEAD WITHOUT CONTRAST TECHNIQUE: Multiplanar, multiecho pulse sequences of the brain and surrounding structures were obtained without intravenous contrast.  COMPARISON:  None. FINDINGS: Brain: There is a single punctate focus of diffusion hyperintensity in the high left frontal lobe along the precentral gyrus adjacent to the skull (series 5, image 89). This is not definitely identified on the coronal DWI. No evidence of intracranial hemorrhage. There is no intracranial mass, mass effect, or edema. There is no hydrocephalus or extra-axial fluid collection. Ventricles and sulci are within normal limits in size and configuration. Patchy foci of T2 hyperintensity in the supratentorial white matter nonspecific but may reflect minor chronic microvascular ischemic changes. Vascular: Major vessel flow voids at the skull base are preserved. Skull and upper cervical spine: Normal marrow signal is preserved. Sinuses/Orbits: Trace paranasal sinus mucosal thickening. Orbits are unremarkable. Other: Sella is unremarkable. Minor right mastoid fluid opacification. IMPRESSION: Punctate acute or subacute infarct versus artifact in the high left frontal lobe. Otherwise, no evidence of recent infarction, hemorrhage, or mass. Minor chronic microvascular ischemic changes. Electronically Signed   By: Guadlupe Spanish M.D.   On: 02/04/2021 12:28   IR US Guide Bx Asp/Drain  Result Date: 02/02/2021 INDICATION: Bacteremia and multiloculated fluid collection involving the left upper posterior chest wall. EXAM: ULTRASOUND-GUIDED ASPIRATION DRAINAGE OF LEFT POSTERIOR CHEST WALL ABSCESS MEDICATIONS: The patient is currently admitted to the hospital and receiving intravenous antibiotics. The antibiotics were administered within an appropriate time frame prior to the initiation of the procedure. ANESTHESIA/SEDATION: Moderate (conscious) sedation was employed during this procedure. A total of Versed 3.5 mg and Fentanyl 1 mcg was administered intravenously. Moderate Sedation Time: 14 minutes. The patient's level of consciousness and vital signs were monitored continuously by radiology nursing  throughout the procedure under my direct supervision. COMPLICATIONS: None immediate. PROCEDURE: Informed written consent was obtained from the  patient after a thorough discussion of the procedural risks, benefits and alternatives. All questions were addressed. Maximal Sterile Barrier Technique was utilized including caps, mask, sterile gowns, sterile gloves, sterile drape, hand hygiene and skin antiseptic. A timeout was performed prior to the initiation of the procedure. Ultrasound was utilized to localize a fluid collection in the soft tissues of the left upper posterior chest wall. Under ultrasound guidance, a 5 [place] Yueh centesis needle catheter was advanced into the fluid collection. Aspiration was performed. A fluid sample was sent for culture analysis. Additional ultrasound was performed. FINDINGS: Fluid collection was able to be localized superficial to the posterior ribs and centered predominantly medial to the scapula on the left. Aspiration yielded purulent fluid. A total volume of approximately 60 mL of fluid was able to be aspirated from the collection resulting in significant decompression by ultrasound. IMPRESSION: Ultrasound-guided aspiration of left upper posterior chest wall abscess medial to the scapula. 60 mL of fluid was able to be aspirated from a centesis catheter. A fluid sample was sent for culture analysis. Electronically Signed   By: Irish Lack M.D.   On: 02/02/2021 17:06   ECHOCARDIOGRAM COMPLETE  Result Date: 02/03/2021    ECHOCARDIOGRAM REPORT   Patient Name:   John Ware Date of Exam: 02/03/2021 Medical Rec #:  [id number]        Height:       70.0 in Accession #:    [id number]       Weight:       198.2 lb Date of Birth:  October 23, 1965        BSA:          2.079 m Patient Age:    54 years         BP:           129/83 mmHg Patient Gender: M                HR:           116 bpm. Exam Location:  Inpatient Procedure: 2D Echo, Cardiac Doppler, Color Doppler and Intracardiac             Opacification Agent Indications:    Bacteremia R78.81  History:        Patient has no prior history of Echocardiogram examinations.                 Risk Factors:Hypertension and Diabetes. COVID-19.  Sonographer:    Elmarie Shiley Dance Referring Phys: [id number] IMPRESSIONS  1. Left ventricular ejection fraction, by estimation, is 55 to 60%. The left ventricle has normal function. The left ventricle has no regional wall motion abnormalities. There is mild left ventricular hypertrophy. Left ventricular diastolic parameters were normal.  2. Right ventricular systolic function is normal. The right ventricular size is normal.  3. The mitral valve is normal in structure. No evidence of mitral valve regurgitation. No evidence of mitral stenosis.  4. The aortic valve is tricuspid. There is mild calcification of the aortic valve. Aortic valve regurgitation is not visualized. Mild aortic valve sclerosis is present, with no evidence of aortic valve stenosis.  5. Aortic dilatation noted. There is mild dilatation of the ascending aorta, measuring 38 mm.  6. The inferior vena cava is normal in size with greater than 50% respiratory variability, suggesting right atrial pressure of 3 mmHg. FINDINGS  Left Ventricle: Left ventricular ejection fraction, by estimation, is 55 to 60%. The left ventricle has normal function. The left ventricle  has no regional wall motion abnormalities. Definity contrast agent was given IV to delineate the left ventricular  endocardial borders. The left ventricular internal cavity size was normal in size. There is mild left ventricular hypertrophy. Left ventricular diastolic parameters were normal. Right Ventricle: The right ventricular size is normal. No increase in right ventricular wall thickness. Right ventricular systolic function is normal. Left Atrium: Left atrial size was normal in size. Right Atrium: Right atrial size was normal in size. Pericardium: There is no evidence of pericardial  effusion. Mitral Valve: The mitral valve is normal in structure. No evidence of mitral valve regurgitation. No evidence of mitral valve stenosis. Tricuspid Valve: The tricuspid valve is normal in structure. Tricuspid valve regurgitation is mild . No evidence of tricuspid stenosis. Aortic Valve: The aortic valve is tricuspid. There is mild calcification of the aortic valve. Aortic valve regurgitation is not visualized. Mild aortic valve sclerosis is present, with no evidence of aortic valve stenosis. Pulmonic Valve: The pulmonic valve was normal in structure. Pulmonic valve regurgitation is not visualized. No evidence of pulmonic stenosis. Aorta: The aortic root is normal in size and structure and aortic dilatation noted. There is mild dilatation of the ascending aorta, measuring 38 mm. Venous: The inferior vena cava is normal in size with greater than 50% respiratory variability, suggesting right atrial pressure of 3 mmHg. IAS/Shunts: No atrial level shunt detected by color flow Doppler.  LEFT VENTRICLE PLAX 2D LVIDd:         4.60 cm  Diastology LVIDs:         2.60 cm  LV e' medial:    7.94 cm/s LV PW:         1.20 cm  LV E/e' medial:  5.9 LV IVS:        1.20 cm  LV e' lateral:   10.20 cm/s LVOT diam:     2.40 cm  LV E/e' lateral: 4.6 LV SV:         51 LV SV Index:   24 LVOT Area:     4.52 cm  RIGHT VENTRICLE             IVC RV S prime:     16.20 cm/s  IVC diam: 2.00 cm TAPSE (M-mode): 1.9 cm LEFT ATRIUM             Index LA diam:        3.50 cm 1.68 cm/m LA Vol (A2C):   30.3 ml 14.57 ml/m LA Vol (A4C):   45.6 ml 21.93 ml/m LA Biplane Vol: 39.1 ml 18.80 ml/m  AORTIC VALVE LVOT Vmax:   86.30 cm/s LVOT Vmean:  56.900 cm/s LVOT VTI:    0.112 m  AORTA Ao Root diam: 4.00 cm Ao Asc diam:  3.80 cm MITRAL VALVE MV Area (PHT): 4.60 cm    SHUNTS MV Decel Time: 165 msec    Systemic VTI:  0.11 m MV E velocity: 47.10 cm/s  Systemic Diam: 2.40 cm MV A velocity: 48.40 cm/s MV E/A ratio:  0.97 Charlton Haws MD Electronically  signed by Charlton Haws MD Signature Date/Time: 02/03/2021/11:37:09 AM    Final    MR BRAIN WO CONTRAST  Final Result    IR US Guide Bx Asp/Drain  Final Result    CT CHEST ABDOMEN PELVIS W CONTRAST  Final Result    DG Shoulder Left  Final Result    DG Shoulder Right  Final Result    MR THORACIC SPINE W WO CONTRAST  Final  Result    DG Chest Port 1 View  Final Result      Scheduled Meds:  Chlorhexidine Gluconate Cloth  6 each Topical Daily   enoxaparin (LOVENOX) injection  40 mg Subcutaneous Q24H   gabapentin  800 mg Oral TID   insulin aspart  0-15 Units Subcutaneous TID WC   insulin aspart  0-5 Units Subcutaneous QHS   insulin glargine  10 Units Subcutaneous Daily   mouth rinse  15 mL Mouth Rinse BID   oxyCODONE  20 mg Oral Q12H   polyethylene glycol  17 g Oral Daily   senna-docusate  1 tablet Oral BID   PRN Meds: acetaminophen, alum & mag hydroxide-simeth, docusate sodium, methocarbamol, morphine injection, ondansetron (ZOFRAN) IV, oxyCODONE, traZODone Continuous Infusions:  sodium chloride 75 mL/hr at 02/04/21 1300    ceFAZolin (ANCEF) IV 2 g (02/04/21 1411)     LOS: 4 days  Time spent: Greater than 50% of the 35 minute visit was spent in counseling/coordination of care for the patient as laid out in the A&P.   Lewie Chamber, MD [facility] 02/04/2021, 3:25 PM

## 2021-02-05 ENCOUNTER — Inpatient Hospital Stay (HOSPITAL_COMMUNITY): Payer: Commercial Managed Care - PPO | Admitting: Anesthesiology

## 2021-02-05 ENCOUNTER — Encounter (HOSPITAL_COMMUNITY)
Admission: EM | Disposition: A | Discharge status: Home Health | Payer: Self-pay | Source: Home / Self Care | Attending: Internal Medicine

## 2021-02-05 ENCOUNTER — Inpatient Hospital Stay (HOSPITAL_COMMUNITY): Payer: Commercial Managed Care - PPO

## 2021-02-05 ENCOUNTER — Encounter (HOSPITAL_COMMUNITY): Payer: Self-pay | Admitting: Internal Medicine

## 2021-02-05 DIAGNOSIS — A419 Sepsis, unspecified organism: Secondary | ICD-10-CM | POA: Diagnosis not present

## 2021-02-05 DIAGNOSIS — R7881 Bacteremia: Secondary | ICD-10-CM

## 2021-02-05 DIAGNOSIS — I330 Acute and subacute infective endocarditis: Secondary | ICD-10-CM | POA: Diagnosis not present

## 2021-02-05 DIAGNOSIS — R6520 Severe sepsis without septic shock: Secondary | ICD-10-CM | POA: Diagnosis not present

## 2021-02-05 DIAGNOSIS — I38 Endocarditis, valve unspecified: Secondary | ICD-10-CM

## 2021-02-05 HISTORY — PX: TEE WITHOUT CARDIOVERSION: SHX5443

## 2021-02-05 LAB — PHOSPHORUS: Phosphorus: 3.3 mg/dL (ref 2.5–4.6)

## 2021-02-05 LAB — BASIC METABOLIC PANEL
Anion gap: 9 (ref 5–15)
Anion gap: 9 (ref 5–15)
BUN: 8 mg/dL (ref 6–20)
BUN: 8 mg/dL (ref 6–20)
CO2: 27 mmol/L (ref 22–32)
CO2: 26 mmol/L (ref 22–32)
Calcium: 7.6 mg/dL — ABNORMAL LOW (ref 8.9–10.3)
Calcium: 7.6 mg/dL — ABNORMAL LOW (ref 8.9–10.3)
Chloride: 91 mmol/L — ABNORMAL LOW (ref 98–111)
Chloride: 90 mmol/L — ABNORMAL LOW (ref 98–111)
Creatinine, Ser: 0.54 mg/dL — ABNORMAL LOW (ref 0.61–1.24)
Creatinine, Ser: 0.54 mg/dL — ABNORMAL LOW (ref 0.61–1.24)
GFR, Estimated: >60 mL/min (ref >60)
GFR, Estimated: >60 mL/min (ref >60)
Glucose, Bld: 174 mg/dL — ABNORMAL HIGH (ref 70–99)
Glucose, Bld: 246 mg/dL — ABNORMAL HIGH (ref 70–99)
Potassium: 3.9 mmol/L (ref 3.5–5.1)
Potassium: 4.1 mmol/L (ref 3.5–5.1)
Sodium: 127 mmol/L — ABNORMAL LOW (ref 135–145)
Sodium: 125 mmol/L — ABNORMAL LOW (ref 135–145)

## 2021-02-05 LAB — CBC WITH DIFFERENTIAL/PLATELET
Abs Immature Granulocytes: 2.02 10*3/uL — ABNORMAL HIGH (ref 0.00–0.07)
Basophils Absolute: 0.1 10*3/uL (ref 0.0–0.1)
Basophils Relative: 1 %
Eosinophils Absolute: 0.1 10*3/uL (ref 0.0–0.5)
Eosinophils Relative: 0 %
HCT: 30 % — ABNORMAL LOW (ref 39.0–52.0)
Hemoglobin: 10.6 g/dL — ABNORMAL LOW (ref 13.0–17.0)
Immature Granulocytes: 10 %
Lymphocytes Relative: 7 %
Lymphs Abs: 1.4 10*3/uL (ref 0.7–4.0)
MCH: 30.2 pg (ref 26.0–34.0)
MCHC: 35.3 g/dL (ref 30.0–36.0)
MCV: 85.5 fL (ref 80.0–100.0)
Monocytes Absolute: 0.8 10*3/uL (ref 0.1–1.0)
Monocytes Relative: 4 %
Neutro Abs: 16 10*3/uL — ABNORMAL HIGH (ref 1.7–7.7)
Neutrophils Relative %: 78 %
Platelets: 425 10*3/uL — ABNORMAL HIGH (ref 150–400)
RBC: 3.51 MIL/uL — ABNORMAL LOW (ref 4.22–5.81)
RDW: 14.5 % (ref 11.5–15.5)
WBC: 20.4 10*3/uL — ABNORMAL HIGH (ref 4.0–10.5)
nRBC: 0 % (ref 0.0–0.2)

## 2021-02-05 LAB — GLUCOSE, CAPILLARY
Glucose-Capillary: 171 mg/dL — ABNORMAL HIGH (ref 70–99)
Glucose-Capillary: 167 mg/dL — ABNORMAL HIGH (ref 70–99)
Glucose-Capillary: 239 mg/dL — ABNORMAL HIGH (ref 70–99)
Glucose-Capillary: 190 mg/dL — ABNORMAL HIGH (ref 70–99)

## 2021-02-05 LAB — MAGNESIUM: Magnesium: 2.1 mg/dL (ref 1.7–2.4)

## 2021-02-05 SURGERY — ECHOCARDIOGRAM, TRANSESOPHAGEAL
Anesthesia: Monitor Anesthesia Care

## 2021-02-05 MED ORDER — LIDOCAINE HCL (PF) 2 % IJ SOLN
INTRAMUSCULAR | Status: DC | PRN
  Administered 2021-02-05: 40 mg via INTRADERMAL

## 2021-02-05 MED ORDER — PHENYLEPHRINE 40 MCG/ML (10ML) SYRINGE FOR IV PUSH (FOR BLOOD PRESSURE SUPPORT)
PREFILLED_SYRINGE | INTRAVENOUS | Status: DC | PRN
  Administered 2021-02-05 (×2): 120 ug via INTRAVENOUS

## 2021-02-05 MED ORDER — FENTANYL CITRATE (PF) 100 MCG/2ML IJ SOLN
INTRAMUSCULAR | Status: DC | PRN
  Administered 2021-02-05: 50 ug via INTRAVENOUS

## 2021-02-05 MED ORDER — PROPOFOL 10 MG/ML IV BOLUS
INTRAVENOUS | Status: DC | PRN
  Administered 2021-02-05 (×3): 20 mg via INTRAVENOUS

## 2021-02-05 MED ORDER — PROPOFOL 500 MG/50ML IV EMUL
INTRAVENOUS | Status: DC | PRN
  Administered 2021-02-05: 100 ug/kg/min via INTRAVENOUS

## 2021-02-05 MED ORDER — SODIUM CHLORIDE 0.9 % IV SOLN
INTRAVENOUS | Status: DC | PRN
  Administered 2021-02-05 – 2021-03-06 (×4): 250 mL via INTRAVENOUS

## 2021-02-05 MED ORDER — LACTATED RINGERS IV SOLN
INTRAVENOUS | Status: DC
  Administered 2021-02-05: 1000 mL via INTRAVENOUS

## 2021-02-05 MED ORDER — SODIUM CHLORIDE 0.9 % IV SOLN: INTRAVENOUS | Status: DC

## 2021-02-05 NOTE — Progress Notes (Signed)
Physical Therapy Treatment Patient Details Name: John Ware MRN: [id number] DOB: 02/03/1966 Today's Date: 02/05/2021    History of Present Illness Patient is a 55 y.o. male presented to ED with weakness and per wife generally unwell over the past 3 weeks. He had a positive home COVID test on 01/12/21. He had no respiratory symptoms at the time. He just had fevers that were responsive to APAP. He self isolated at home until 01/20/21 when he was at a party and experienced a fall. Pt had pain in Lt shoulder, no acute injury seen on x-ray. PMH significant of chronic pain, HTN, DM2, depression, spinal stenosis.    PT Comments    Pt tolerated increased activity level today, he ambulated 15' with RW, distance limited by pain and fatigue. Pt continues to have pain and limited ROM B shoulders. Instructed pt in BLE strengthening exercises. He puts forth good effort.    Follow Up Recommendations  CIR     Equipment Recommendations  None recommended by PT (TBD)    Recommendations for Other Services Rehab consult     Precautions / Restrictions Precautions Precautions: Fall Restrictions Weight Bearing Restrictions: No    Mobility  Bed Mobility Overal bed mobility: Needs Assistance Bed Mobility: Supine to Sit     Supine to sit: +2 for physical assistance;Max assist     General bed mobility comments: pt requries increased time to initiate LE mobility to EOB. max A of 2 to raise trunk, pivot hips to EOB and advance BLEs. Pt with limited ability to assist with BUEs 2* weakness/pain.    Transfers Overall transfer level: Needs assistance Equipment used: Rolling walker (2 wheeled) Transfers: Sit to/from Stand Sit to Stand: From elevated surface;Mod assist;+2 physical assistance         General transfer comment: VCs hand placement, assist to power up  Ambulation/Gait Ambulation/Gait assistance: Min guard Gait Distance (Feet): 15 Feet Assistive device: Rolling walker (2  wheeled) Gait Pattern/deviations: Step-through pattern;Decreased step length - right;Decreased step length - left Gait velocity: decr   General Gait Details: distance limited by pain and fatigue, no loss of balance   Stairs             Wheelchair Mobility    Modified Rankin (Stroke Patients Only)       Balance Overall balance assessment: Needs assistance Sitting-balance support: Feet supported;Bilateral upper extremity supported Sitting balance-[name]: Fair     Standing balance support: During functional activity;Bilateral upper extremity supported Standing balance-[name]: Poor                              Cognition Arousal/Alertness: Awake/alert Behavior During Therapy: WFL for tasks assessed/performed Overall Cognitive Status: Within Functional Limits for tasks assessed                                        Exercises General Exercises - Lower Extremity Ankle Circles/Pumps: AROM;Both;10 reps Quad Sets: AROM;Both;10 reps;Supine Heel Slides: AAROM;Both;10 reps;Supine Hip ABduction/ADduction: AAROM;Both;10 reps;Supine Other Exercises Other Exercises: B gastroc stretch using gait belt looped around foot, 30 sec hold x 1 each    General Comments        Pertinent Vitals/Pain Pain Score: 8  Pain Location: B sides and shoulders Pain Descriptors / Indicators: Guarding;Grimacing Pain Intervention(s): Limited activity within patient's tolerance;Monitored during session;Premedicated before session;RN gave pain meds  during session;Repositioned    Home Living                      Prior Function            PT Goals (current goals can now be found in the care plan section) Acute Rehab PT Goals Patient Stated Goal: to regain strength PT Goal Formulation: With patient Time For Goal Achievement: 02/15/21 Potential to Achieve Goals: Good Progress towards PT goals: Progressing toward goals    Frequency    Min  4X/week      PT Plan Current plan remains appropriate    Co-evaluation              AM-PAC PT "6 Clicks" Mobility   Outcome Measure  Help needed turning from your back to your side while in a flat bed without using bedrails?: A Little Help needed moving from lying on your back to sitting on the side of a flat bed without using bedrails?: A Lot Help needed moving to and from a bed to a chair (including a wheelchair)?: A Lot Help needed standing up from a chair using your arms (e.g., wheelchair or bedside chair)?: A Lot Help needed to walk in hospital room?: A Little Help needed climbing 3-5 steps with a railing? : A Lot 6 Click Score: 14    End of Session Equipment Utilized During Treatment: Gait belt Activity Tolerance: Patient tolerated treatment well;Patient limited by fatigue Patient left: with call bell/phone within reach;in chair;with chair alarm set Nurse Communication: Mobility status PT Visit Diagnosis: Muscle weakness (generalized) (M62.81);Unsteadiness on feet (R26.81);History of falling (Z91.81);Difficulty in walking, not elsewhere classified (R26.2)     Time: 5284-1324 PT Time Calculation (min) (ACUTE ONLY): 40 min  Charges:  $Gait Training: 8-22 mins $Therapeutic Exercise: 8-22 mins $Therapeutic Activity: 8-22 mins                    Ralene Bathe Kistler PT 02/05/2021  Acute Rehabilitation Services Pager [phone] Office [phone]

## 2021-02-05 NOTE — Consult Note (Signed)
WOC Nurse Consult Note: Patient receiving care in [facility]. Patient is COVID +. Reason for Consult: stage 2 PIs to bilateral buttocks Wound type: per flowsheet, stage 2 Pressure Injury POA: Yes/No/NA Measurement: To be provided by the bedside RN in the flowsheet section  Wound bed: To be provided by the bedside RN in the flowsheet section  Drainage (amount, consistency, odor)  Periwound: Dressing procedure/placement/frequency: I have enacted the Skin Care Standing Order Set for stage 2 PIs that any licensed nurse in the [facility] system can access and initiate order from. Monitor the wound area(s) for worsening of condition such as: Signs/symptoms of infection,  Increase in size,  Development of or worsening of odor, Development of pain, or increased pain at the affected locations.  Notify the medical team if any of these develop. WOC nurse will not follow at this time.  Please re-consult the WOC team if needed.  Helmut Muster, RN, MSN, CWOCN, CNS-BC, pager [phone]

## 2021-02-05 NOTE — Anesthesia Postprocedure Evaluation (Signed)
Anesthesia Post Note  Patient: Petr L Crespi  Procedure(s) Performed: TRANSESOPHAGEAL ECHOCARDIOGRAM (TEE)     Patient location during evaluation: Endoscopy Anesthesia Type: MAC Level of consciousness: awake and alert, patient cooperative and oriented Pain management: pain level controlled Vital Signs Assessment: post-procedure vital signs reviewed and stable Respiratory status: spontaneous breathing, nonlabored ventilation and respiratory function stable Cardiovascular status: blood pressure returned to baseline and stable Postop Assessment: no apparent nausea or vomiting Anesthetic complications: no   No notable events documented.  Last Vitals:  Vitals:   02/05/21 1420 02/05/21 1430  BP: 116/69   Pulse: 97 (!) 109  Resp: 19 (!) 21  Temp:    SpO2: 91% 92%    Last Pain:  Vitals:   02/05/21 1350  TempSrc: Oral  PainSc: 5                  Earlyne Feeser,E. Naithen Rivenburg

## 2021-02-05 NOTE — Progress Notes (Signed)
  Echocardiogram Echocardiogram Transesophageal has been performed.  Roosvelt Maser F 02/05/2021, 2:24 PM

## 2021-02-05 NOTE — Progress Notes (Signed)
Inpatient Diabetes Program Recommendations  AACE/ADA: New Consensus Statement on Inpatient Glycemic Control (2015)  Target Ranges:  Prepandial:   less than 140 mg/dL      Peak postprandial:   less than 180 mg/dL (1-2 hours)      Critically ill patients:  140 - 180 mg/dL   Lab Results  Component Value Date   GLUCAP 171 (H) 02/05/2021   HGBA1C 10.2 (H) 01/31/2021    Review of Glycemic Control Results for RAYON, MCCHRISTIAN (MRN [id number]) as of 02/05/2021 11:02  Ref. Range 02/04/2021 08:00 02/04/2021 12:23 02/04/2021 16:30 02/04/2021 22:11 02/05/2021 07:35  Glucose-Capillary Latest Ref Range: 70 - 99 mg/dL 734 (H) 287 (H) 681 (H) 233 (H) 171 (H)    Current orders for Inpatient glycemic control: Lantus 10 units daily Novolog 0-15 units TID and 0-5 units QHS  Inpatient Diabetes Program Recommendations:    Please consider:  Novolog 3 units TID with meals if consumes at least 50%.  Will continue to follow while inpatient.  Thank you, Dulce Sellar, RN, BSN Diabetes Coordinator Inpatient Diabetes Program [phone] (team pager from 8a-5p)

## 2021-02-05 NOTE — CV Procedure (Signed)
    PROCEDURE NOTE:  Procedure:  Transesophageal echocardiogram Operator:  Armanda Magic, MD Indications:  Bacteremia Complications: None  During this procedure the patient is administered a total of Propofol 300 mg , Neo and Lidocaine 40mg  to achieve and maintain moderate conscious sedation.  The patient's heart rate, blood pressure, and oxygen saturation are monitored continuously during the procedure.by anesthesia   Results: Normal LV size and function Normal RV size and function Normal RA Normal LA and LA appendage Abnormal TV with large filamentous highly mobile density predominantly on the ventricular side of the TV leaflets consistent with vegetation.  There is mild associated TR. Normal PV  Normal MV with trivial MR Normal trileaflet AV with trivial AR Normal interatrial septum with no evidence of shunt by colorflow dopper  Normal thoracic and ascending aorta.    The patient tolerated the procedure well and was transferred back to their room in stable condition.  Signed: , MD [facility]

## 2021-02-05 NOTE — Progress Notes (Signed)
Patient picked by [facility] ems for procedure transfer to [facility]. VS wnl as charted at the time of transfer. Report given to transport personnel, 2 PIVs blood return present, flushed and clamped. Transport ticket and transfer packet given to transport team.

## 2021-02-05 NOTE — Anesthesia Procedure Notes (Signed)
Procedure Name: MAC Date/Time: 02/05/2021 1:19 PM Performed by: Janace Litten, CRNA Pre-anesthesia Checklist: Patient identified, Emergency Drugs available, Suction available and Patient being monitored Patient Re-evaluated:Patient Re-evaluated prior to induction Oxygen Delivery Method: Nasal cannula

## 2021-02-05 NOTE — Transfer of Care (Signed)
Immediate Anesthesia Transfer of Care Note  Patient: John Ware  Procedure(s) Performed: TRANSESOPHAGEAL ECHOCARDIOGRAM (TEE)  Patient Location: PACU  Anesthesia Type:MAC  Level of Consciousness: drowsy, patient cooperative and responds to stimulation  Airway & Oxygen Therapy: Patient Spontanous Breathing and Patient connected to nasal cannula oxygen  Post-op Assessment: Report given to RN and Post -op Vital signs reviewed and stable  Post vital signs: Reviewed and stable  Last Vitals:  Vitals Value Taken Time  BP 91/63 02/05/21 1349  Temp    Pulse 87 02/05/21 1350  Resp 17 02/05/21 1350  SpO2 97 % 02/05/21 1350  Vitals shown include unvalidated device data.  Last Pain:  Vitals:   02/05/21 1239  TempSrc: Oral  PainSc: 10-Worst pain ever      Patients Stated Pain Goal: 0 (82/80/03 4917)  Complications: No notable events documented.

## 2021-02-05 NOTE — Interval H&P Note (Signed)
History and Physical Interval Note:  02/05/2021 1:04 PM  John Ware  has presented today for surgery, with the diagnosis of BACTEREMIA.  The various methods of treatment have been discussed with the patient and family. After consideration of risks, benefits and other options for treatment, the patient has consented to  Procedure(s): TRANSESOPHAGEAL ECHOCARDIOGRAM (TEE) (N/A) as a surgical intervention.  The patient's history has been reviewed, patient examined, no change in status, stable for surgery.  I have reviewed the patient's chart and labs.  Questions were answered to the patient's satisfaction.     Armanda Magic

## 2021-02-05 NOTE — TOC Progression Note (Signed)
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   Leukocytosis = improving while on abtx   Hyponatremia = also improving. Steadily correcting. Confusion improving as well  na-127,wcb20.4, bld culture x2 x4d=neg. PLAN: transfer to cir when stable  Expected Discharge Plan: Home/Self Care Barriers to Discharge: Continued Medical Work up  Expected Discharge Plan and Services Expected Discharge Plan: Home/Self Care   Discharge Planning Services: CM Consult   Living arrangements for the past 2 months: Single Family Home                                       Social Determinants of Health (SDOH) Interventions    Readmission Risk Interventions No flowsheet data found.

## 2021-02-05 NOTE — Consult Note (Signed)
Chief Complaint  Patient presents with   Generalized Body Aches  :       Ophthalmology HPI: This is a 55 y.o.  male with a past ocular history listed below that presents with new onset floaters in both eyes. 1-2 floaters.  Appear in both eyes.  No flashes of light or curtains coming over vision.  No eye pain or red eye or decrease in vision     Past Ocular History: refractive error, diabetes without diabetic retinopathy      Past Medical History:  Diagnosis Date   Depression    Diabetes mellitus without complication (HCC)    Hypertension    Spinal stenosis    With Neurogenic Claudication     Past Surgical History:  Procedure Laterality Date   CARPAL TUNNEL RELEASE Bilateral    IR US GUIDE BX ASP/DRAIN  02/02/2021   LUMBAR LAMINECTOMY/DECOMPRESSION MICRODISCECTOMY N/A 12/13/2014   Procedure: LUMBAR THREE-FOUR, LUMBAR FOUR-FIVE, LUMBAR FIVE-SACRAL ONE LAMINECTOMY;  Surgeon: [name], MD;  Location: [facility];  Service: Neurosurgery;  Laterality: N/A;  L3-4 L4-5 L5-S1 Laminectomy   MENISCUS REPAIR     Right knee   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   VASECTOMY       Social History   Socioeconomic History   Marital status: Married    Spouse name: Not on file   Number of children: Not on file   Years of education: Not on file   Highest education level: Not on file  Occupational History   Not on file  Tobacco Use   Smoking status: Former    Packs/day: 2.00    Years: 15.00    Pack years: 30.00    Types: Cigarettes    Quit date: 2012    Years since quitting: 10.4   Smokeless tobacco: Never  Vaping Use   Vaping Use: Never used  Substance and Sexual Activity   Alcohol use: Yes    Comment: occasional   Drug use: No   Sexual activity: Not on file  Other Topics Concern   Not on file  Social History Narrative   Not on file   Social Determinants of Health   Financial Resource Strain: Not on file  Food Insecurity: Not on file   Transportation Needs: Not on file  Physical Activity: Not on file  Stress: Not on file  Social Connections: Not on file  Intimate Partner Violence: Not on file     Allergies  Allergen Reactions   Tizanidine     Leg cramps     No current facility-administered medications on file prior to encounter.   Current Outpatient Medications on File Prior to Encounter  Medication Sig Dispense Refill   acetaminophen (TYLENOL) 325 MG tablet Take 650-975 mg by mouth every 6 (six) hours as needed for mild pain, fever or headache.     amLODipine (NORVASC) 5 MG tablet Take 1 tablet (5 mg total) by mouth daily. 90 tablet 3   buPROPion (WELLBUTRIN XL) 150 MG 24 hr tablet TAKE (1) TABLET TWICE A DAY. (Patient taking differently: Take 150 mg by mouth 2 (two) times daily.) 60 tablet 5   busPIRone (BUSPAR) 5 MG tablet TAKE 1 TABLET 3 TIMES A DAY (Patient taking differently: Take 5 mg by mouth 3 (three) times daily.) 90 tablet 1   gabapentin (NEURONTIN) 800 MG tablet TAKE (1) TABLET THREE TIMES DAILY. (Patient taking differently: Take 800 mg by mouth 3 (three) times daily.) 90  tablet 2   metFORMIN (GLUCOPHAGE XR) 750 MG 24 hr tablet Take 2 tablets (1,500 mg total) by mouth daily with breakfast. 180 tablet 2   methocarbamol (ROBAXIN) 500 MG tablet Take 1 tablet (500 mg total) by mouth 2 (two) times daily as needed for muscle spasms. 20 tablet 0   oxyCODONE ER (XTAMPZA ER) 18 MG C12A Take 1 tablet by mouth every 12 (twelve) hours.     oxyCODONE-acetaminophen (PERCOCET) 10-325 MG tablet Take 1 tablet by mouth 3 (three) times daily as needed for pain.     ramipril (ALTACE) 10 MG capsule TAKE (1) CAPSULE DAILY (Patient taking differently: Take 10 mg by mouth daily.) 90 capsule 3   sertraline (ZOLOFT) 100 MG tablet Take 1 tablet (100 mg total) by mouth daily. 30 tablet 5   Vitamin D, Ergocalciferol, (DRISDOL) 1.25 MG (50000 UNIT) CAPS capsule Take 50,000 Units by mouth once a week.     diclofenac Sodium (VOLTAREN)  1 % GEL Apply 2 g topically 4 (four) times daily. (Patient not taking: Reported on 01/31/2021) 100 g 0   empagliflozin (JARDIANCE) 10 MG TABS tablet Take 1 tablet (10 mg total) by mouth daily before breakfast. (Patient not taking: Reported on 01/31/2021) 90 tablet 1   [DISCONTINUED] atorvastatin (LIPITOR) 20 MG tablet Take 1 tablet (20 mg total) by mouth daily. (Patient not taking: Reported on 06/23/2020) 90 tablet 3     ROS    Exam:  General: Awake, Alert, Oriented *3  Vision (near): without correction   OD: J6  OS: J6  Confrontational Field:   Full to count fingers, both eyes  Extraocular Motility:  Full ductions and versions, both eyes  [place]:   Trace commitant exodeviation without vertical.   External:   Normal Symmetry, V1-3 intact, infraorbital nerve appears intact.   Pupils  OD: 67mm to 22mm reactive without afferent pupillary defect (APD)  OS: 67mm to 32mm reactive without afferent pupillary defect (APD)   IOP(tonopen)  OD: 14  OS: 14  Slit Lamp Exam:  Lids/Lashes  OD: Normal Lids and lashes, No lesion or injury  OS: Normal lids and lashes, nor lesion or injury  Conjucntiva/Sclera  OD: White and quiet  OS: White and quiet  Cornea  OD: Clear without abrasion or defect  OS: Clear without abrasion or defect  Anterior Chamber  OD: shallow and quiet  OS: shallow and quiet  Iris  OD: Normal iris architecture  OS: Normal Iris Architecture   Lens  OD: Trace NSC   OS: Trace NSC  Anterior Vitreous  OD: Clear, without cell  OS: Clear without cell   POSTERIOR POLE EXAM (Dialated with phenylephrine and tropicamide.Dilation may last up to 24 hours)  View:   OD: 20/20 view without opacities  OS: 20/20 view without opacities  Vitreous:   OD: Clear, no cell  OS: Clear, no cell  Disc:   OD: flat, sharp margin, with appropriate color  OS: flat, sharp margin, with appropriate color  C:D Ratio:   OD: 0.4  OS: 0.4  Macula  OD: Flat, with appropriate  light reflex  OS: Flat with appropriate light reflex  Vessels  OD: Normal vasculature  OS: Normal vasculature  Periphery  OD: Flat 360 degrees without tear, hole or detachment  OS: Flat 360 degrees without tear, hole or detachment     Assessment and Plan:   This is 55 y.o.  male with sepsis, endocarditis, bacteremia that presents with posterior vitreous detachment.  No signs of endophthalmitis.  No ophthalmic intervention at this time. Endophthalmitis signs and symptoms reviewed with patient. Call for new or worsening floaters, red eye, eye pain, loss of vision.    Diabetes without diabetic retinopathy. Recommend bp control, smoking cessation, and glucose control.    Follow  up as outpatient as needed.     Mack Hook, M.D.  [facility] [address] [place], [place] [address] [phone]

## 2021-02-05 NOTE — Interval H&P Note (Signed)
History and Physical Interval Note:  02/05/2021 1:06 PM  John Ware  has presented today for surgery, with the diagnosis of BACTEREMIA.  The various methods of treatment have been discussed with the patient and family. After consideration of risks, benefits and other options for treatment, the patient has consented to  Procedure(s): TRANSESOPHAGEAL ECHOCARDIOGRAM (TEE) (N/A) as a surgical intervention.  The patient's history has been reviewed, patient examined, no change in status, stable for surgery.  I have reviewed the patient's chart and labs.  Questions were answered to the patient's satisfaction.     Armanda Magic

## 2021-02-05 NOTE — Anesthesia Preprocedure Evaluation (Addendum)
Anesthesia Evaluation  Patient identified by MRN, date of birth, ID band Patient awake    Reviewed: Allergy & Precautions, H&P , NPO status , Patient's Chart, lab work & pertinent test results  Airway Mallampati: II  TM Distance: >3 FB Neck ROM: Full    Dental no notable dental hx. (+) Teeth Intact, Dental Advisory Given   Pulmonary neg pulmonary ROS, former smoker,    Pulmonary exam normal breath sounds clear to auscultation       Cardiovascular Exercise Tolerance: Good hypertension, Pt. on medications + DOE  Normal cardiovascular exam Rhythm:Regular Rate:Normal  ECHO 6/22 IMPRESSIONS    1. Left ventricular ejection fraction, by estimation, is 55 to 60%. The  left ventricle has normal function. The left ventricle has no regional  wall motion abnormalities. There is mild left ventricular hypertrophy.  Left ventricular diastolic parameters  were normal.  2. Right ventricular systolic function is normal. The right ventricular  size is normal.  3. The mitral valve is normal in structure. No evidence of mitral valve  regurgitation. No evidence of mitral stenosis.  4. The aortic valve is tricuspid. There is mild calcification of the  aortic valve. Aortic valve regurgitation is not visualized. Mild aortic  valve sclerosis is present, with no evidence of aortic valve stenosis.  5. Aortic dilatation noted. There is mild dilatation of the ascending  aorta, measuring 38 mm.  6. The inferior vena cava is normal in size with greater than 50%  respiratory variability, suggesting right atrial pressure of 3 mmHg.    Neuro/Psych PSYCHIATRIC DISORDERS Anxiety Depression  Neuromuscular disease    GI/Hepatic negative GI ROS, Neg liver ROS,   Endo/Other  diabetes, Type 2, Oral Hypoglycemic Agents  Renal/GU   negative genitourinary   Musculoskeletal  (+) Arthritis , Osteoarthritis,    Abdominal   Peds  Hematology  (+)  Blood dyscrasia, anemia ,   Anesthesia Other Findings   Reproductive/Obstetrics negative OB ROS                            Anesthesia Physical  Anesthesia Plan  ASA: 3  Anesthesia Plan: MAC   Post-op Pain Management:    Induction: Intravenous  PONV Risk Score and Plan: 3  Airway Management Planned: Natural Airway and Mask  Additional Equipment: None  Intra-op Plan:   Post-operative Plan: Extubation in OR  Informed Consent: I have reviewed the patients History and Physical, chart, labs and discussed the procedure including the risks, benefits and alternatives for the proposed anesthesia with the patient or authorized representative who has indicated his/her understanding and acceptance.     Dental advisory given  Plan Discussed with: CRNA and Anesthesiologist  Anesthesia Plan Comments:        Anesthesia Quick Evaluation

## 2021-02-05 NOTE — Assessment & Plan Note (Addendum)
-   now diagnosed on TEE on 6/13, large TV vegetation - continue Ancef - blood cx from 6/9 remains negative.

## 2021-02-05 NOTE — Progress Notes (Signed)
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 Concern that he complains of blurry vision, in addition has rising WBC despite aspiration of subscapular abscess  - recommend to have ophtho see patient to evaluate for endophthalmitis - also recommend mri of shoulders to see if signs of septic arthritis which would require I x D. - source of MSSA bacteremia/infection is puzzling, recently had COVID-19 which may have placed him susceptible invasive disease - spoke with Dr Frederick Peers regarding plan.  John Salvia Drue Second MD MPH [facility] [phone]

## 2021-02-05 NOTE — Progress Notes (Signed)
Progress Note    John Ware   ZOX:[id number]  DOB: [birth year]  DOA: 01/31/2021     5  PCP: Junie Spencer, FNP  CC: Weakness, ongoing pain  Hospital Course: John Ware is a 55 y.o. male with medical history significant of chronic pain, HTN, DM2. Presenting with weakness. History is from wife as the patient is a poor historian.  She reports that the patient has been generally unwell over the past 3 weeks or so. He had a positive home COVID test on 01/12/21. He had no respiratory symptoms at the time. He just had fevers that were responsive to APAP. He self isolated at home until 01/20/21 when he was at a party. He apparently fell out of a chair and hurt his left shoulder. He was evaluated in the ED and they found no fracture. He was given a sling and told to follow up with orthopedics.  Since that time, the wife reports that he's had general body aches/pain that have increased and are not responsive to his regular chronic pain meds. He has had a generally poor appetite during this time. She reports that he's been unable to walk for the last 2 days. These constellation of symptoms were concerning, so she brought him to the ED.    He was found to have a sodium of 113, a glucose of 339, a WBC of 20.5, and a lactic acid of 3.8. He was started on broad spec abx and given fluids.  Soon after admission, blood cultures became positive for 4/4 bottles MSSA.  Antibiotics were transitioned to Ancef.  With further work-up and collateral information he was found to have a large fluid collection involving his left lateral and posterior chest/back.  He also had tenderness in his mid back.  He underwent MRI T-spine and CT chest/abdomen/pelvis.  There was no evidence of osteomyelitis or discitis or other underlying infection on MRI T-spine.  CT chest showed large multiloculated fluid collection.  IR was consulted and he underwent ultrasound guided aspiration which was sent for culturing.  CT chest was  also concerning for evidence of septic emboli.  2D echo did not reveal any evidence of endocarditis.  Cardiology was consulted for a TEE per ID recommendations as well.  He underwent TEE on 02/05/2021 which showed a large tricuspid valve vegetation with mild TR.  Interval History:  No events overnight.  Seen this morning with PT using his walker.  His shoulders are still very stiff and he was then pain while doing so.  He underwent TEE this afternoon and was found to have vegetation on the tricuspid valve.  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 This would be start date of abx likely. Final rec's per ID for length of duration of Ancef, likely 6 weeks; he would be a candidate for a PICC and if accepted to CIR they could continue abx there if approved.   Bacteremia due to Staphylococcus  aureus - 4/4 blood cultures on admission growing MSSA -Repeated blood cultures on 02/01/2021; NGTD - TTE negative for vegetations; cardiology consulted for TEE, 02/05/21: POSITIVE for TV vegetation - now s/p IR drainage of left posterior chest wall fluid collection which was noted to be purulent in nature; culture also growing MSSA - MRI T spine 02/01/21 negative for OM/discitis  - MRI brain 6/12 negative for pathology in visual cortex to explain "floaters" in visual fields -  continue ancef - ID following  Hyponatremia - Presumed due to dehydration and hypokalemia - severely low on admission, 113 - stabilizing out in mid-upper 120s. Given several days of IVF now, will pause IVF tonight and monitor response - continue BMP at 5a/5p for now - resume IVF if Na falls again  Left shoulder pain - See work-up above - Was previously seen in the ER after his fall and recommended for a sling with outpatient Ortho follow-up -Bilateral shoulder x-rays unremarkable for now.  If aspiration from chest wall fluid collection unremarkable, may need to further work-up his shoulders with MRI - pain/stiffness not improving; low threshold to obtain MRI shoulders if WBC and bandemia continues to trend up  Vision changes - Patient now stating on 02/04/2021 that he has been seeing "black floaters" in his field of vision for several days.  When questioned why he did not bring this up sooner, he did not have a good answer and did not want to "trouble [place]" - Etiology unclear but concern would be for septic emboli given CT chest also concerning for septic emboli to the lungs - MRI brain ordered: punctate acute or subacute infarct vs artifact in high left frontal lobe. This would not affect his vision as he is noting. He may need ophtho referral at discharge   DKA (diabetic ketoacidosis) (HCC)-resolved as of 02/02/2021 - present on admission - responded to fluids and insulin - now resolved  Ascending aorta dilatation (HCC) - TTE on 6/10 notes mild dilation of ascending aorta, 38 mm - follow TEE results as well: normal thoracic and ascending aorta noted on 02/05/21  Anxiety - Wellbutrin and Zoloft on hold for now in setting of hyponatremia  HTN (hypertension) - Pressure normal on admission.  Will treat if needed  Diabetes mellitus (HCC) - Continue SSI and CBG monitoring -Continue Lantus - A1c 10.2% on 01/31/2021   Old records reviewed in assessment of this  patient  Antimicrobials: Cefepime 01/31/21 x 1 Rocephin 01/31/21 x 1 Vanc 01/31/21 x 1 Ancef 02/01/21 >> current  DVT prophylaxis: enoxaparin (LOVENOX) injection 40 mg Start: 01/31/21 2200   Code Status:   Code Status: Full Code Family Communication:   Disposition Plan: Status is: Inpatient  Remains inpatient appropriate because:Ongoing diagnostic testing needed not appropriate for outpatient work up, IV treatments appropriate due to intensity of illness or inability to take PO, and Inpatient level of care appropriate due to severity of illness  Dispo: The patient is from: Home              Anticipated d/c is to:  Possibly CIR              Patient currently is not medically stable to d/c.   Difficult to place patient No  Risk of unplanned readmission score: Unplanned Admission- Pilot do not use: 18.93   Objective: Blood pressure 129/64, pulse 74, temperature 99.5 F (37.5 C), temperature source Oral, resp. rate (!) 30, height 5\' 10"  (1.778 m), weight 97.3 kg, SpO2 98 %.  Examination:  General appearance: alert, cooperative, no distress, and slowed mentation Head: Normocephalic, without obvious abnormality, atraumatic Eyes:  EOMI; gross field of vision is intact Back:  left back noted with large ~10 area of swelling; no erythema, calor Lungs: clear to auscultation bilaterally Chest wall:  mild left chest TTP; swelling from back wraps to left lateral chest wall Heart: regular rate and rhythm, S1, S2 normal, and unable to appreciate any murmurs Abdomen: normal findings: bowel sounds normal and soft, non-tender Extremities:  Developing edema from fluids Skin: mobility and turgor normal Neurologic: Grossly normal  Consultants:  ID  Procedures:  IR drainage of back fluid collection, 6/10  Data Reviewed: I have personally reviewed following labs and imaging studies Results for orders placed or performed during the hospital encounter of 01/31/21 (from the past 24 hour(s))  Glucose,  capillary     Status: Abnormal   Collection Time: 02/04/21  4:30 PM  Result Value Ref Range   Glucose-Capillary 183 (H) 70 - 99 mg/dL  Basic metabolic panel     Status: Abnormal   Collection Time: 02/04/21  4:45 PM  Result Value Ref Range   Sodium 125 (L) 135 - 145 mmol/L   Potassium 3.6 3.5 - 5.1 mmol/L   Chloride 92 (L) 98 - 111 mmol/L   CO2 25 22 - 32 mmol/L   Glucose, Bld 183 (H) 70 - 99 mg/dL   BUN 9 6 - 20 mg/dL   Creatinine, Ser 2.13 (L) 0.61 - 1.24 mg/dL   Calcium 7.5 (L) 8.9 - 10.3 mg/dL   GFR, Estimated >08 >65 mL/min   Anion gap 8 5 - 15  Glucose, capillary     Status: Abnormal   Collection Time: 02/04/21 10:11 PM  Result Value Ref Range   Glucose-Capillary 233 (H) 70 - 99 mg/dL  CBC with Differential/Platelet     Status: Abnormal   Collection Time: 02/05/21  5:58 AM  Result Value Ref Range   WBC 20.4 (H) 4.0 - 10.5 K/uL   RBC 3.51 (L) 4.22 - 5.81 MIL/uL   Hemoglobin 10.6 (L) 13.0 - 17.0 g/dL   HCT 78.4 (L) 69.6 - 29.5 %   MCV 85.5 80.0 - 100.0 fL   MCH 30.2 26.0 - 34.0 pg   MCHC 35.3 30.0 - 36.0 g/dL   RDW 28.4 13.2 - 44.0 %   Platelets 425 (H) 150 - 400 K/uL   nRBC 0.0 0.0 - 0.2 %   Neutrophils Relative % 78 %   Neutro Abs 16.0 (H) 1.7 - 7.7 K/uL   Lymphocytes Relative 7 %   Lymphs Abs 1.4 0.7 - 4.0 K/uL   Monocytes Relative 4 %   Monocytes Absolute 0.8 0.1 - 1.0 K/uL   Eosinophils Relative 0 %   Eosinophils Absolute 0.1 0.0 - 0.5 K/uL   Basophils Relative 1 %   Basophils Absolute 0.1 0.0 - 0.1 K/uL   WBC Morphology DOHLE BODIES    Immature Granulocytes 10 %   Abs Immature Granulocytes 2.02 (H) 0.00 - 0.07 K/uL  Magnesium     Status: None   Collection Time: 02/05/21  5:58 AM  Result Value Ref Range   Magnesium 2.1 1.7 - 2.4 mg/dL  Phosphorus     Status: None   Collection Time: 02/05/21  5:58 AM  Result Value Ref Range   Phosphorus 3.3 2.5 - 4.6 mg/dL  Basic metabolic panel     Status: Abnormal   Collection Time: 02/05/21  5:58 AM  Result Value Ref  Range   Sodium 127 (L) 135 - 145 mmol/L   Potassium 3.9 3.5 - 5.1 mmol/L   Chloride 91 (L) 98 - 111 mmol/L   CO2 27 22 - 32 mmol/L   Glucose, Bld 174 (H) 70 - 99 mg/dL   BUN 8 6 - 20 mg/dL   Creatinine, Ser 1.61 (L) 0.61 - 1.24 mg/dL   Calcium 7.6 (L) 8.9 - 10.3 mg/dL   GFR, Estimated >09 >60 mL/min   Anion gap 9 5 - 15  Glucose, capillary     Status: Abnormal   Collection Time: 02/05/21  7:35 AM  Result Value Ref Range   Glucose-Capillary 171 (H) 70 - 99 mg/dL   Comment 1 Notify RN    Comment 2 Document in Chart   Glucose, capillary     Status: Abnormal   Collection Time: 02/05/21 11:10 AM  Result Value Ref Range   Glucose-Capillary 167 (H) 70 - 99 mg/dL   Comment 1 Notify RN    Comment 2 Document in Chart     Recent Results (from the past 240 hour(s))  Urine culture     Status: Abnormal   Collection Time: 01/31/21  8:51 AM   Specimen: In/Out Cath Urine  Result Value Ref Range Status   Specimen Description   Final    IN/OUT CATH URINE Performed at [facility], [address], [place], [place] [address]    Special Requests   Final    NONE Performed at [facility], [address] [place], [place] [address]    Culture >=100,000 COLONIES/mL STAPHYLOCOCCUS AUREUS (A)  Final   Report Status 02/02/2021 FINAL  Final   Organism ID, Bacteria STAPHYLOCOCCUS AUREUS (A)  Final      Susceptibility   Staphylococcus aureus - MIC*    CIPROFLOXACIN >=8 RESISTANT Resistant     GENTAMICIN <=0.5 SENSITIVE Sensitive     NITROFURANTOIN <=16 SENSITIVE Sensitive     OXACILLIN 0.5 SENSITIVE Sensitive     TETRACYCLINE <=1 SENSITIVE Sensitive     VANCOMYCIN 1 SENSITIVE Sensitive     TRIMETH/SULFA <=10 SENSITIVE Sensitive     CLINDAMYCIN <=0.25 SENSITIVE Sensitive     RIFAMPIN <=0.5 SENSITIVE Sensitive     Inducible Clindamycin NEGATIVE Sensitive     * >=100,000 COLONIES/mL STAPHYLOCOCCUS AUREUS  Resp Panel by RT-PCR (Flu A&B, Covid) Nasopharyngeal  Swab     Status: Abnormal   Collection Time: 01/31/21 10:59 AM   Specimen: Nasopharyngeal Swab; Nasopharyngeal(NP) swabs in vial transport medium  Result Value Ref Range Status   SARS Coronavirus 2 by RT PCR POSITIVE (A) NEGATIVE Final    Comment: RESULT CALLED TO, READ BACK BY AND VERIFIED WITH: GARRISON,G. RN AT 1206 01/31/21 MULLINS,T (NOTE) SARS-CoV-2 target nucleic acids are DETECTED.  The SARS-CoV-2 RNA is generally detectable in upper respiratory specimens during the acute phase of infection. Positive results are indicative of the presence of the identified virus, but do not rule out bacterial infection or co-infection with other pathogens not detected by the test. Clinical correlation with patient history and other diagnostic information is necessary to determine patient infection status. The expected result is Negative.  Fact Sheet for Patients: BloggerCourse.com  Fact Sheet for Healthcare Providers: SeriousBroker.it  This test is not yet approved or cleared by the [place] FDA and  has been authorized for detection and/or diagnosis of SARS-CoV-2 by FDA under an Emergency Use Authorization (EUA).  This EUA will remain in effect (meaning this tes t can  be used) for the duration of  the COVID-19 declaration under Section 564(b)(1) of the Act, 21 U.S.C. section 360bbb-3(b)(1), unless the authorization is terminated or revoked sooner.     Influenza A by PCR NEGATIVE NEGATIVE Final   Influenza B by PCR NEGATIVE NEGATIVE Final    Comment: (NOTE) The Xpert Xpress SARS-CoV-2/FLU/RSV plus assay is intended as an aid in the diagnosis of influenza from Nasopharyngeal swab specimens and should not be used as a sole basis for treatment. Nasal washings and aspirates are unacceptable for Xpert Xpress SARS-CoV-2/FLU/RSV testing.  Fact Sheet for Patients: BloggerCourse.comhttps://www.fda.gov/media/152166/download  Fact Sheet for Healthcare  Providers: SeriousBroker.ithttps://www.fda.gov/media/152162/download  This test is not yet approved or cleared by the [place] FDA and has been authorized for detection and/or diagnosis of SARS-CoV-2 by FDA under an Emergency Use Authorization (EUA). This EUA will remain in effect (meaning this test can be used) for the duration of the COVID-19 declaration under Section 564(b)(1) of the Act, 21 U.S.C. section 360bbb-3(b)(1), unless the authorization is terminated or revoked.  Performed at [facility], [address] [place], [place] [address]   Blood Culture (routine x 2)     Status: Abnormal   Collection Time: 01/31/21 10:59 AM   Specimen: BLOOD  Result Value Ref Range Status   Specimen Description   Final    BLOOD LEFT ANTECUBITAL Performed at [facility], [address] [place], [place] [address]    Special Requests   Final    BOTTLES DRAWN AEROBIC AND ANAEROBIC Blood Culture adequate volume Performed at [facility], [address] [place], [place] [address]    Culture  Setup Time   Final    GRAM POSITIVE COCCI IN CLUSTERS IN BOTH AEROBIC AND ANAEROBIC BOTTLES CRITICAL RESULT CALLED TO, READ BACK BY AND VERIFIED WITH: PHARMD MICHELLE L. [id number] FCP Performed at [facility], [address] [place], [place] [address]    Culture STAPHYLOCOCCUS AUREUS (A)  Final   Report Status 02/04/2021 FINAL  Final   Organism ID, Bacteria STAPHYLOCOCCUS AUREUS  Final      Susceptibility   Staphylococcus aureus - MIC*    CIPROFLOXACIN >=8 RESISTANT Resistant     ERYTHROMYCIN >=8 RESISTANT Resistant     GENTAMICIN <=0.5 SENSITIVE Sensitive     OXACILLIN 0.5 SENSITIVE Sensitive     TETRACYCLINE <=1 SENSITIVE Sensitive     VANCOMYCIN <=0.5 SENSITIVE Sensitive     TRIMETH/SULFA <=10 SENSITIVE Sensitive     CLINDAMYCIN <=0.25 SENSITIVE Sensitive     RIFAMPIN <=0.5 SENSITIVE Sensitive     Inducible Clindamycin NEGATIVE Sensitive      * STAPHYLOCOCCUS AUREUS  Blood Culture ID Panel (Reflexed)     Status: Abnormal   Collection Time: 01/31/21 10:59 AM  Result Value Ref Range Status   Enterococcus faecalis NOT DETECTED NOT DETECTED Final   Enterococcus Faecium NOT DETECTED NOT DETECTED Final   Listeria monocytogenes NOT DETECTED NOT DETECTED Final   Staphylococcus species DETECTED (A) NOT DETECTED Final    Comment: CRITICAL RESULT CALLED TO, READ BACK BY AND VERIFIED WITH: PHARMD MICHELLE L. [id number] FCP    Staphylococcus aureus (BCID) DETECTED (A) NOT DETECTED Final    Comment: CRITICAL RESULT CALLED TO, READ BACK BY AND VERIFIED WITH: PHARMD MICHELLE L. [id number] FCP    Staphylococcus epidermidis NOT DETECTED NOT DETECTED Final   Staphylococcus lugdunensis NOT DETECTED NOT DETECTED Final   Streptococcus species NOT DETECTED NOT DETECTED Final  Streptococcus agalactiae NOT DETECTED NOT DETECTED Final   Streptococcus pneumoniae NOT DETECTED NOT DETECTED Final   Streptococcus pyogenes NOT DETECTED NOT DETECTED Final   A.calcoaceticus-baumannii NOT DETECTED NOT DETECTED Final   Bacteroides fragilis NOT DETECTED NOT DETECTED Final   Enterobacterales NOT DETECTED NOT DETECTED Final   Enterobacter cloacae complex NOT DETECTED NOT DETECTED Final   Escherichia coli NOT DETECTED NOT DETECTED Final   Klebsiella aerogenes NOT DETECTED NOT DETECTED Final   Klebsiella oxytoca NOT DETECTED NOT DETECTED Final   Klebsiella pneumoniae NOT DETECTED NOT DETECTED Final   Proteus species NOT DETECTED NOT DETECTED Final   Salmonella species NOT DETECTED NOT DETECTED Final   Serratia marcescens NOT DETECTED NOT DETECTED Final   Haemophilus influenzae NOT DETECTED NOT DETECTED Final   Neisseria meningitidis NOT DETECTED NOT DETECTED Final   Pseudomonas aeruginosa NOT DETECTED NOT DETECTED Final   Stenotrophomonas maltophilia NOT DETECTED NOT DETECTED Final   Candida albicans NOT DETECTED NOT DETECTED Final   Candida auris NOT  DETECTED NOT DETECTED Final   Candida glabrata NOT DETECTED NOT DETECTED Final   Candida krusei NOT DETECTED NOT DETECTED Final   Candida parapsilosis NOT DETECTED NOT DETECTED Final   Candida tropicalis NOT DETECTED NOT DETECTED Final   Cryptococcus neoformans/gattii NOT DETECTED NOT DETECTED Final   Meth resistant mecA/C and MREJ NOT DETECTED NOT DETECTED Final    Comment: Performed at [facility], [address] [place], [place] [address]  Blood Culture (routine x 2)     Status: Abnormal   Collection Time: 01/31/21 11:15 AM   Specimen: BLOOD  Result Value Ref Range Status   Specimen Description   Final    BLOOD RIGHT ANTECUBITAL Performed at [facility], [address] [place], [place] [address]    Special Requests   Final    BOTTLES DRAWN AEROBIC AND ANAEROBIC Blood Culture adequate volume Performed at [facility], [address], [place], [place] [address]    Culture  Setup Time   Final    GRAM POSITIVE COCCI IN CLUSTERS IN BOTH AEROBIC AND ANAEROBIC BOTTLES CRITICAL VALUE NOTED.  VALUE IS CONSISTENT WITH PREVIOUSLY REPORTED AND CALLED VALUE.    Culture (A)  Final    STAPHYLOCOCCUS AUREUS SUSCEPTIBILITIES PERFORMED ON PREVIOUS CULTURE WITHIN THE LAST 5 DAYS. Performed at [facility], [address], [place], [place] [address]    Report Status 02/03/2021 FINAL  Final  MRSA PCR Screening     Status: None   Collection Time: 01/31/21  2:28 PM   Specimen: Nasal Mucosa; Nasopharyngeal  Result Value Ref Range Status   MRSA by PCR NEGATIVE NEGATIVE Final    Comment:        The GeneXpert MRSA Assay (FDA approved for NASAL specimens only), is one component of a comprehensive MRSA colonization surveillance program. It is not intended to diagnose MRSA infection nor to guide or monitor treatment for MRSA infections. Performed at [facility], [address] [place], [place] [address]   Culture, blood  (routine x 2)     Status: None (Preliminary result)   Collection Time: 02/01/21  2:20 PM   Specimen: BLOOD  Result Value Ref Range Status   Specimen Description   Final    BLOOD RIGHT ANTECUBITAL Performed at [facility], [address], [place], [place] [address]    Special Requests   Final    BOTTLES DRAWN AEROBIC AND ANAEROBIC Blood Culture adequate volume  Performed at [facility], [address] [place], [place] [address]    Culture   Final    NO GROWTH 4 DAYS Performed at [facility], [address] [place], [place] [address]    Report Status PENDING  Incomplete  Culture, blood (routine x 2)     Status: None (Preliminary result)   Collection Time: 02/01/21  2:20 PM   Specimen: BLOOD RIGHT HAND  Result Value Ref Range Status   Specimen Description   Final    BLOOD RIGHT HAND Performed at [facility], [address] [place], [place] [address]    Special Requests   Final    BOTTLES DRAWN AEROBIC AND ANAEROBIC Blood Culture adequate volume Performed at [facility], [address], [place], [place] [address]    Culture   Final    NO GROWTH 4 DAYS Performed at [facility], [address], [place], [place] [address]    Report Status PENDING  Incomplete  Aerobic/Anaerobic Culture w Gram Stain (surgical/deep wound)     Status: None (Preliminary result)   Collection Time: 02/02/21  4:12 PM   Specimen: Abscess  Result Value Ref Range Status   Specimen Description   Final    ABSCESS SUBSCAPULAR Performed at [facility], [address], [place], [place] [address]    Special Requests   Final    NONE Performed at [facility], [address] [place], [place] [address]    Gram Stain   Final    ABUNDANT WBC PRESENT, PREDOMINANTLY PMN ABUNDANT GRAM POSITIVE COCCI IN CLUSTERS IN TETRADS Performed at [facility], [address]  [place], [place] [address]    Culture   Final    ABUNDANT STAPHYLOCOCCUS AUREUS NO ANAEROBES ISOLATED; CULTURE IN PROGRESS FOR 5 DAYS    Report Status PENDING  Incomplete   Organism ID, Bacteria STAPHYLOCOCCUS AUREUS  Final      Susceptibility   Staphylococcus aureus - MIC*    CIPROFLOXACIN >=8 RESISTANT Resistant     ERYTHROMYCIN >=8 RESISTANT Resistant     GENTAMICIN <=0.5 SENSITIVE Sensitive     OXACILLIN 0.5 SENSITIVE Sensitive     TETRACYCLINE <=1 SENSITIVE Sensitive     VANCOMYCIN <=0.5 SENSITIVE Sensitive     TRIMETH/SULFA <=10 SENSITIVE Sensitive     CLINDAMYCIN <=0.25 SENSITIVE Sensitive     RIFAMPIN <=0.5 SENSITIVE Sensitive     Inducible Clindamycin NEGATIVE Sensitive     * ABUNDANT STAPHYLOCOCCUS AUREUS     Radiology Studies: MR BRAIN WO CONTRAST  Result Date: 02/04/2021 CLINICAL DATA:  Floaters in both eyes, MSSA bacteremia EXAM: MRI HEAD WITHOUT CONTRAST TECHNIQUE: Multiplanar, multiecho pulse sequences of the brain and surrounding structures were obtained without intravenous contrast. COMPARISON:  None. FINDINGS: Brain: There is a single punctate focus of diffusion hyperintensity in the high left frontal lobe along the precentral gyrus adjacent to the skull (series 5, image 89). This is not definitely identified on the coronal DWI. No evidence of intracranial hemorrhage. There is no intracranial mass, mass effect, or edema. There is no hydrocephalus or extra-axial fluid collection. Ventricles and sulci are within normal limits in size and configuration. Patchy foci of T2 hyperintensity in the supratentorial white matter nonspecific but may reflect minor chronic microvascular ischemic changes. Vascular: Major vessel flow voids at the skull base are preserved. Skull and upper cervical spine: Normal marrow signal is preserved. Sinuses/Orbits: Trace paranasal sinus mucosal thickening.  Orbits are unremarkable. Other: Sella is unremarkable. Minor right mastoid fluid opacification.  IMPRESSION: Punctate acute or subacute infarct versus artifact in the high left frontal lobe. Otherwise, no evidence of recent infarction, hemorrhage, or mass. Minor chronic microvascular ischemic changes. Electronically Signed   By: Guadlupe Spanish M.D.   On: 02/04/2021 12:28   MR BRAIN WO CONTRAST  Final Result    IR US Guide Bx Asp/Drain  Final Result    CT CHEST ABDOMEN PELVIS W CONTRAST  Final Result    DG Shoulder Left  Final Result    DG Shoulder Right  Final Result    MR THORACIC SPINE W WO CONTRAST  Final Result    DG Chest Port 1 View  Final Result      Scheduled Meds:  [MAR Hold] Chlorhexidine Gluconate Cloth  6 each Topical Daily   [MAR Hold] enoxaparin (LOVENOX) injection  40 mg Subcutaneous Q24H   [MAR Hold] gabapentin  800 mg Oral TID   [MAR Hold] insulin aspart  0-15 Units Subcutaneous TID WC   [MAR Hold] insulin aspart  0-5 Units Subcutaneous QHS   [MAR Hold] insulin glargine  10 Units Subcutaneous Daily   [MAR Hold] mouth rinse  15 mL Mouth Rinse BID   [MAR Hold] oxyCODONE  20 mg Oral Q12H   [MAR Hold] polyethylene glycol  17 g Oral Daily   [MAR Hold] senna-docusate  1 tablet Oral BID   PRN Meds: [MAR Hold] acetaminophen, [MAR Hold] alum & mag hydroxide-simeth, [MAR Hold] docusate sodium, [MAR Hold] methocarbamol, [MAR Hold]  morphine injection, [MAR Hold] ondansetron (ZOFRAN) IV, [MAR Hold] oxyCODONE, [MAR Hold] traZODone Continuous Infusions:  sodium chloride Stopped (02/05/21 0534)   sodium chloride     [MAR Hold]  ceFAZolin (ANCEF) IV Stopped (02/05/21 0604)   lactated ringers 50 mL/hr at 02/05/21 1316     LOS: 5 days  Time spent: Greater than 50% of the 35 minute visit was spent in counseling/coordination of care for the patient as laid out in the A&P.   Lewie Chamber, MD [facility] 02/05/2021, 2:59 PM

## 2021-02-06 ENCOUNTER — Inpatient Hospital Stay (HOSPITAL_COMMUNITY): Payer: Commercial Managed Care - PPO

## 2021-02-06 DIAGNOSIS — A419 Sepsis, unspecified organism: Secondary | ICD-10-CM

## 2021-02-06 DIAGNOSIS — R6520 Severe sepsis without septic shock: Secondary | ICD-10-CM

## 2021-02-06 LAB — BASIC METABOLIC PANEL
Anion gap: 8 (ref 5–15)
BUN: 8 mg/dL (ref 6–20)
CO2: 26 mmol/L (ref 22–32)
Calcium: 7.6 mg/dL — ABNORMAL LOW (ref 8.9–10.3)
Chloride: 91 mmol/L — ABNORMAL LOW (ref 98–111)
Creatinine, Ser: 0.47 mg/dL — ABNORMAL LOW (ref 0.61–1.24)
GFR, Estimated: >60 mL/min (ref >60)
Glucose, Bld: 176 mg/dL — ABNORMAL HIGH (ref 70–99)
Potassium: 3.8 mmol/L (ref 3.5–5.1)
Sodium: 125 mmol/L — ABNORMAL LOW (ref 135–145)

## 2021-02-06 LAB — CBC WITH DIFFERENTIAL/PLATELET
Abs Immature Granulocytes: 1.52 10*3/uL — ABNORMAL HIGH (ref 0.00–0.07)
Basophils Absolute: 0.1 10*3/uL (ref 0.0–0.1)
Basophils Relative: 1 %
Eosinophils Absolute: 0.1 10*3/uL (ref 0.0–0.5)
Eosinophils Relative: 0 %
HCT: 29.9 % — ABNORMAL LOW (ref 39.0–52.0)
Hemoglobin: 10.5 g/dL — ABNORMAL LOW (ref 13.0–17.0)
Immature Granulocytes: 7 %
Lymphocytes Relative: 8 %
Lymphs Abs: 1.8 10*3/uL (ref 0.7–4.0)
MCH: 30.3 pg (ref 26.0–34.0)
MCHC: 35.1 g/dL (ref 30.0–36.0)
MCV: 86.4 fL (ref 80.0–100.0)
Monocytes Absolute: 0.7 10*3/uL (ref 0.1–1.0)
Monocytes Relative: 3 %
Neutro Abs: 18.1 10*3/uL — ABNORMAL HIGH (ref 1.7–7.7)
Neutrophils Relative %: 81 %
Platelets: 397 10*3/uL (ref 150–400)
RBC: 3.46 MIL/uL — ABNORMAL LOW (ref 4.22–5.81)
RDW: 14.6 % (ref 11.5–15.5)
WBC: 22.3 10*3/uL — ABNORMAL HIGH (ref 4.0–10.5)
nRBC: 0 % (ref 0.0–0.2)

## 2021-02-06 LAB — CBC
HCT: 31 % — ABNORMAL LOW (ref 39.0–52.0)
Hemoglobin: 10.8 g/dL — ABNORMAL LOW (ref 13.0–17.0)
MCH: 30.8 pg (ref 26.0–34.0)
MCHC: 34.8 g/dL (ref 30.0–36.0)
MCV: 88.3 fL (ref 80.0–100.0)
Platelets: 414 10*3/uL — ABNORMAL HIGH (ref 150–400)
RBC: 3.51 MIL/uL — ABNORMAL LOW (ref 4.22–5.81)
RDW: 14.7 % (ref 11.5–15.5)
WBC: 20.2 10*3/uL — ABNORMAL HIGH (ref 4.0–10.5)
nRBC: 0 % (ref 0.0–0.2)

## 2021-02-06 LAB — GLUCOSE, CAPILLARY
Glucose-Capillary: 197 mg/dL — ABNORMAL HIGH (ref 70–99)
Glucose-Capillary: 270 mg/dL — ABNORMAL HIGH (ref 70–99)
Glucose-Capillary: 266 mg/dL — ABNORMAL HIGH (ref 70–99)
Glucose-Capillary: 178 mg/dL — ABNORMAL HIGH (ref 70–99)

## 2021-02-06 LAB — CULTURE, BLOOD (ROUTINE X 2)
Culture: NO GROWTH
Culture: NO GROWTH
Special Requests: ADEQUATE
Special Requests: ADEQUATE

## 2021-02-06 LAB — DIFFERENTIAL
Abs Immature Granulocytes: 1.05 10*3/uL — ABNORMAL HIGH (ref 0.00–0.07)
Basophils Absolute: 0.1 10*3/uL (ref 0.0–0.1)
Basophils Relative: 0 %
Eosinophils Absolute: 0 10*3/uL (ref 0.0–0.5)
Eosinophils Relative: 0 %
Immature Granulocytes: 5 %
Lymphocytes Relative: 5 %
Lymphs Abs: 1.1 10*3/uL (ref 0.7–4.0)
Monocytes Absolute: 0.6 10*3/uL (ref 0.1–1.0)
Monocytes Relative: 3 %
Neutro Abs: 17.4 10*3/uL — ABNORMAL HIGH (ref 1.7–7.7)
Neutrophils Relative %: 87 %

## 2021-02-06 LAB — PHOSPHORUS: Phosphorus: 4.2 mg/dL (ref 2.5–4.6)

## 2021-02-06 LAB — OSMOLALITY: Osmolality: 274 mOsm/kg — ABNORMAL LOW (ref 275–295)

## 2021-02-06 LAB — MAGNESIUM: Magnesium: 2.1 mg/dL (ref 1.7–2.4)

## 2021-02-06 LAB — TSH: TSH: 5.745 u[IU]/mL — ABNORMAL HIGH (ref 0.350–4.500)

## 2021-02-06 LAB — OSMOLALITY, URINE: Osmolality, Ur: 309 mOsm/kg (ref 300–900)

## 2021-02-06 LAB — SODIUM, URINE, RANDOM: Sodium, Ur: <10 mmol/L

## 2021-02-06 MED ORDER — INSULIN GLARGINE 100 UNIT/ML ~~LOC~~ SOLN
15.0000 [IU] | Freq: Every day | SUBCUTANEOUS | Status: DC
  Administered 2021-02-07 – 2021-02-21 (×13): 15 [IU] via SUBCUTANEOUS
  Filled 2021-02-06 (×15): qty 0.15

## 2021-02-06 MED ORDER — MAGIC MOUTHWASH W/LIDOCAINE
5.0000 mL | Freq: Four times a day (QID) | ORAL | Status: DC | PRN
  Administered 2021-02-06 – 2021-03-13 (×42): 5 mL via ORAL
  Filled 2021-02-06 (×54): qty 5

## 2021-02-06 MED ORDER — COSYNTROPIN 0.25 MG IJ SOLR
0.2500 mg | Freq: Once | INTRAMUSCULAR | Status: AC
  Administered 2021-02-07: 0.25 mg via INTRAVENOUS
  Filled 2021-02-06: qty 0.25

## 2021-02-06 MED ORDER — GADOBUTROL 1 MMOL/ML IV SOLN
10.0000 mL | Freq: Once | INTRAVENOUS | Status: AC | PRN
  Administered 2021-02-07: 10 mL via INTRAVENOUS

## 2021-02-06 NOTE — Progress Notes (Signed)
Progress Note    ANTONIA CULBERTSON   ZOX:[id number]  DOB: [birth year]  DOA: 01/31/2021     6  PCP: Junie Spencer, FNP  CC: Weakness, ongoing pain  Hospital Course: John Ware is a 55 y.o. male with medical history significant of chronic pain, HTN, DM2. Presenting with weakness. History is from wife as the patient is a poor historian.  She reports that the patient has been generally unwell over the past 3 weeks or so. He had a positive home COVID test on 01/12/21. He had no respiratory symptoms at the time. He just had fevers that were responsive to APAP. He self isolated at home until 01/20/21 when he was at a party. He apparently fell out of a chair and hurt his left shoulder. He was evaluated in the ED and they found no fracture. He was given a sling and told to follow up with orthopedics.  Since that time, the wife reports that he's had general body aches/pain that have increased and are not responsive to his regular chronic pain meds. He has had a generally poor appetite during this time. She reports that he's been unable to walk for the last 2 days. These constellation of symptoms were concerning, so she brought him to the ED.    He was found to have a sodium of 113, a glucose of 339, a WBC of 20.5, and a lactic acid of 3.8. He was started on broad spec abx and given fluids.  Soon after admission, blood cultures became positive for 4/4 bottles MSSA.  Antibiotics were transitioned to Ancef.  With further work-up and collateral information he was found to have a large fluid collection involving his left lateral and posterior chest/back.  He also had tenderness in his mid back.  He underwent MRI T-spine and CT chest/abdomen/pelvis.  There was no evidence of osteomyelitis or discitis or other underlying infection on MRI T-spine.  CT chest showed large multiloculated fluid collection.  IR was consulted and he underwent ultrasound guided aspiration which was sent for culturing.  CT chest was  also concerning for evidence of septic emboli.  2D echo did not reveal any evidence of endocarditis.  Cardiology was consulted for a TEE per ID recommendations as well.  He underwent TEE on 02/05/2021 which showed a large tricuspid valve vegetation with mild TR.  Interval History:  No events overnight. Reports ongoing body aches. Sad, sister passed last week and he was unable to attend funeral.   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 This would be start date of abx likely. Final rec's per ID for length of duration of Ancef, likely 6 weeks; he would be a candidate for a PICC and if accepted to CIR they could continue abx there if approved.    Bacteremia due to Staphylococcus aureus - 4/4 blood cultures on admission growing MSSA -Repeated blood cultures on 02/01/2021; NGTD - TTE negative for vegetations; cardiology consulted for TEE, 02/05/21: POSITIVE for TV vegetation - now s/p IR drainage of left posterior chest wall fluid collection which was noted to be purulent in nature; culture also growing MSSA - MRI T spine 02/01/21  negative for OM/discitis  - MRI brain 6/12 negative for pathology in visual cortex to explain "floaters" in visual fields - continue ancef - ID following   Hyponatremia - Initially presumed due to dehydration and hypokalemia - severely low on admission, 113 - stabilizing out in mid-upper 120s with rehydration. Today appears a bit fluid overloaded and is tolerating good po. Na Remains 125 today - will check tsh, repeat osm and urine osm, urine sodium. Cortisol wnl when checked earlier  this admission, will repeat tomorrow with acth stim test. Reset osmostat possible - may need fluid restriction   Left shoulder pain - See work-up above - Was previously seen in the ER after his fall and recommended for a sling with outpatient Ortho follow-up -Bilateral shoulder x-rays unremarkable for now.   - MRI of shoulders to be performed today to eval for abscess/osteo   Vision changes - Patient now stating on 02/04/2021 that he has been seeing "black floaters" in his field of vision for several days.  When questioned why he did not bring this up sooner, he did not have a good answer and did not want to "trouble [place]" - ophtho evaluated 6/13, no endophthalmitis  DKA (diabetic ketoacidosis) (HCC)-resolved as of 02/02/2021 - present on admission - responded to fluids and insulin - now resolved   Ascending aorta dilatation (HCC) - TTE on 6/10 notes mild dilation of ascending aorta, 38 mm - follow TEE results as well: normal thoracic and ascending aorta noted on 02/05/21   Anxiety - Wellbutrin and Zoloft on hold for now in setting of hyponatremia   HTN (hypertension) - Pressure normal on admission.  Will treat if needed   Diabetes mellitus (HCC) - Continue SSI and CBG monitoring -Continue Lantus, will increase given elevated fasting glucose - A1c 10.2% on 01/31/2021  Antimicrobials: Cefepime 01/31/21 x 1 Rocephin 01/31/21 x 1 Vanc 01/31/21 x 1 Ancef 02/01/21 >> current  DVT prophylaxis: enoxaparin (LOVENOX) injection 40 mg Start: 01/31/21 2200   Code Status:   Code Status: Full Code Family Communication:   Disposition Plan: Status is: Inpatient  Remains inpatient appropriate because:Ongoing diagnostic testing needed not appropriate for outpatient work up, IV treatments appropriate due to intensity of illness or inability to take PO, and Inpatient level of care appropriate due to severity of illness  Dispo: The patient is from: Home              Anticipated d/c is to:  Possibly  CIR              Patient currently is not medically stable to d/c.   Difficult to place patient No  Risk of unplanned readmission score: Unplanned Admission- Pilot do not use: 19.75   Objective: Blood pressure 110/77, pulse (!) 101, temperature 98.5 F (36.9 C), temperature source Oral, resp. rate 16, height 5\' 10"  (1.778 m), weight 97.3 kg, SpO2 94 %.  Examination: General appearance: alert, cooperative, no distress, and slowed mentation Head: Normocephalic, without obvious abnormality, atraumatic Eyes:  EOMI; gross field of vision is intact Back:  left back noted with large ~10 area of swelling; no erythema, calor Lungs: clear to auscultation bilaterally Chest wall:  mild left chest TTP; swelling from back wraps to left lateral chest wall Heart: regular rate and rhythm, S1, S2 normal, and unable to appreciate any murmurs Abdomen: normal findings: bowel sounds normal and soft, non-tender Extremities:  Developing edema from fluids Skin: mobility and turgor normal Neurologic: Grossly normal  Consultants:  ID  Procedures:  IR drainage of back fluid collection, 6/10  Data Reviewed: I have personally reviewed following labs and imaging studies Results for orders placed or performed during the hospital encounter of 01/31/21 (from the past 24 hour(s))  Glucose, capillary     Status: Abnormal   Collection Time: 02/05/21  3:35 PM  Result Value Ref Range   Glucose-Capillary 239 (H) 70 - 99 mg/dL   Comment 1 Notify RN    Comment 2 Document in Chart   Basic metabolic panel     Status: Abnormal   Collection Time: 02/05/21  4:48 PM  Result Value Ref Range   Sodium 125 (L) 135 - 145 mmol/L   Potassium 4.1 3.5 - 5.1 mmol/L   Chloride 90 (L) 98 - 111 mmol/L   CO2 26 22 - 32 mmol/L   Glucose, Bld 246 (H) 70 - 99 mg/dL   BUN 8 6 - 20 mg/dL   Creatinine, Ser 1.61 (L) 0.61 - 1.24 mg/dL   Calcium 7.6 (L) 8.9 - 10.3 mg/dL   GFR, Estimated >09 >60 mL/min   Anion gap 9 5 - 15  Glucose,  capillary     Status: Abnormal   Collection Time: 02/05/21  9:21 PM  Result Value Ref Range   Glucose-Capillary 190 (H) 70 - 99 mg/dL  CBC with Differential/Platelet     Status: Abnormal   Collection Time: 02/06/21  2:39 AM  Result Value Ref Range   WBC 22.3 (H) 4.0 - 10.5 K/uL   RBC 3.46 (L) 4.22 - 5.81 MIL/uL   Hemoglobin 10.5 (L) 13.0 - 17.0 g/dL   HCT 45.4 (L) 09.8 - 11.9 %   MCV 86.4 80.0 - 100.0 fL   MCH 30.3 26.0 - 34.0 pg   MCHC 35.1 30.0 - 36.0 g/dL   RDW 14.7 82.9 - 56.2 %   Platelets 397 150 - 400 K/uL   nRBC 0.0 0.0 - 0.2 %   Neutrophils Relative % 81 %   Neutro Abs 18.1 (H) 1.7 - 7.7 K/uL   Lymphocytes Relative 8 %   Lymphs Abs 1.8 0.7 - 4.0 K/uL   Monocytes Relative 3 %   Monocytes Absolute 0.7 0.1 - 1.0 K/uL   Eosinophils Relative 0 %   Eosinophils Absolute 0.1 0.0 - 0.5 K/uL   Basophils Relative 1 %   Basophils Absolute 0.1 0.0 - 0.1 K/uL   WBC Morphology TOXIC GRANULATION    Immature Granulocytes 7 %   Abs Immature Granulocytes 1.52 (H) 0.00 - 0.07 K/uL   Target Cells PRESENT   Magnesium     Status: None   Collection Time: 02/06/21  2:39 AM  Result Value Ref Range   Magnesium 2.1 1.7 - 2.4 mg/dL  Phosphorus     Status: None   Collection Time: 02/06/21  2:39 AM  Result Value Ref Range   Phosphorus 4.2 2.5 - 4.6 mg/dL  Basic metabolic panel     Status: Abnormal   Collection Time: 02/06/21  2:39 AM  Result Value Ref Range   Sodium 125 (L) 135 - 145 mmol/L   Potassium 3.8 3.5 - 5.1 mmol/L   Chloride 91 (L) 98 - 111 mmol/L   CO2 26 22 - 32 mmol/L   Glucose, Bld 176 (H) 70 - 99 mg/dL   BUN 8 6 - 20 mg/dL   Creatinine, Ser 1.30 (L) 0.61 - 1.24 mg/dL   Calcium 7.6 (L) 8.9 - 10.3 mg/dL   GFR, Estimated >86 >57 mL/min   Anion gap  8 5 - 15  Glucose, capillary     Status: Abnormal   Collection Time: 02/06/21  7:34 AM  Result Value Ref Range   Glucose-Capillary 197 (H) 70 - 99 mg/dL   Comment 1 Notify RN    Comment 2 Document in Chart   Glucose, capillary      Status: Abnormal   Collection Time: 02/06/21 12:18 PM  Result Value Ref Range   Glucose-Capillary 270 (H) 70 - 99 mg/dL   Comment 1 Notify RN    Comment 2 Document in Chart     Recent Results (from the past 240 hour(s))  Urine culture     Status: Abnormal   Collection Time: 01/31/21  8:51 AM   Specimen: In/Out Cath Urine  Result Value Ref Range Status   Specimen Description   Final    IN/OUT CATH URINE Performed at [facility], [address], [place], [place] [address]    Special Requests   Final    NONE Performed at [facility], [address], [place], [place] [address]    Culture >=100,000 COLONIES/mL STAPHYLOCOCCUS AUREUS (A)  Final   Report Status 02/02/2021 FINAL  Final   Organism ID, Bacteria STAPHYLOCOCCUS AUREUS (A)  Final      Susceptibility   Staphylococcus aureus - MIC*    CIPROFLOXACIN >=8 RESISTANT Resistant     GENTAMICIN <=0.5 SENSITIVE Sensitive     NITROFURANTOIN <=16 SENSITIVE Sensitive     OXACILLIN 0.5 SENSITIVE Sensitive     TETRACYCLINE <=1 SENSITIVE Sensitive     VANCOMYCIN 1 SENSITIVE Sensitive     TRIMETH/SULFA <=10 SENSITIVE Sensitive     CLINDAMYCIN <=0.25 SENSITIVE Sensitive     RIFAMPIN <=0.5 SENSITIVE Sensitive     Inducible Clindamycin NEGATIVE Sensitive     * >=100,000 COLONIES/mL STAPHYLOCOCCUS AUREUS  Resp Panel by RT-PCR (Flu A&B, Covid) Nasopharyngeal Swab     Status: Abnormal   Collection Time: 01/31/21 10:59 AM   Specimen: Nasopharyngeal Swab; Nasopharyngeal(NP) swabs in vial transport medium  Result Value Ref Range Status   SARS Coronavirus 2 by RT PCR POSITIVE (A) NEGATIVE Final    Comment: RESULT CALLED TO, READ BACK BY AND VERIFIED WITH: GARRISON,G. RN AT 1206 01/31/21 MULLINS,T (NOTE) SARS-CoV-2 target nucleic acids are DETECTED.  The SARS-CoV-2 RNA is generally detectable in upper respiratory specimens during the acute phase of infection. Positive results are indicative of the  presence of the identified virus, but do not rule out bacterial infection or co-infection with other pathogens not detected by the test. Clinical correlation with patient history and other diagnostic information is necessary to determine patient infection status. The expected result is Negative.  Fact Sheet for Patients: BloggerCourse.com  Fact Sheet for Healthcare Providers: SeriousBroker.it  This test is not yet approved or cleared by the [place] FDA and  has been authorized for detection and/or diagnosis of SARS-CoV-2 by FDA under an Emergency Use Authorization (EUA).  This EUA will remain in effect (meaning this tes t can be used) for the duration of  the COVID-19 declaration under Section 564(b)(1) of the Act, 21 U.S.C. section 360bbb-3(b)(1), unless the authorization is terminated or revoked sooner.     Influenza A by PCR NEGATIVE NEGATIVE Final   Influenza B by PCR NEGATIVE NEGATIVE Final    Comment: (NOTE) The Xpert Xpress SARS-CoV-2/FLU/RSV plus assay is intended as an aid in the diagnosis of influenza from Nasopharyngeal swab specimens and should not be used as a sole basis  for treatment. Nasal washings and aspirates are unacceptable for Xpert Xpress SARS-CoV-2/FLU/RSV testing.  Fact Sheet for Patients: BloggerCourse.com  Fact Sheet for Healthcare Providers: SeriousBroker.it  This test is not yet approved or cleared by the [place] FDA and has been authorized for detection and/or diagnosis of SARS-CoV-2 by FDA under an Emergency Use Authorization (EUA). This EUA will remain in effect (meaning this test can be used) for the duration of the COVID-19 declaration under Section 564(b)(1) of the Act, 21 U.S.C. section 360bbb-3(b)(1), unless the authorization is terminated or revoked.  Performed at [facility], [address] [place], [place] [address]   Blood Culture (routine x 2)     Status: Abnormal   Collection Time: 01/31/21 10:59 AM   Specimen: BLOOD  Result Value Ref Range Status   Specimen Description   Final    BLOOD LEFT ANTECUBITAL Performed at [facility], [address] [place], [place] [address]    Special Requests   Final    BOTTLES DRAWN AEROBIC AND ANAEROBIC Blood Culture adequate volume Performed at [facility], [address], [place], [place] [address]    Culture  Setup Time   Final    GRAM POSITIVE COCCI IN CLUSTERS IN BOTH AEROBIC AND ANAEROBIC BOTTLES CRITICAL RESULT CALLED TO, READ BACK BY AND VERIFIED WITH: PHARMD MICHELLE L. [id number] FCP Performed at [facility], [address] [place], [place] [address]    Culture STAPHYLOCOCCUS AUREUS (A)  Final   Report Status 02/04/2021 FINAL  Final   Organism ID, Bacteria STAPHYLOCOCCUS AUREUS  Final      Susceptibility   Staphylococcus aureus - MIC*    CIPROFLOXACIN >=8 RESISTANT Resistant     ERYTHROMYCIN >=8 RESISTANT Resistant     GENTAMICIN <=0.5 SENSITIVE Sensitive     OXACILLIN 0.5 SENSITIVE Sensitive     TETRACYCLINE <=1 SENSITIVE Sensitive     VANCOMYCIN <=0.5 SENSITIVE Sensitive     TRIMETH/SULFA <=10 SENSITIVE Sensitive     CLINDAMYCIN <=0.25 SENSITIVE Sensitive     RIFAMPIN <=0.5 SENSITIVE Sensitive     Inducible Clindamycin NEGATIVE Sensitive     * STAPHYLOCOCCUS AUREUS  Blood Culture ID Panel (Reflexed)     Status: Abnormal   Collection Time: 01/31/21 10:59 AM  Result Value Ref Range Status   Enterococcus faecalis NOT DETECTED NOT DETECTED Final   Enterococcus Faecium NOT DETECTED NOT DETECTED Final   Listeria monocytogenes NOT DETECTED NOT DETECTED Final   Staphylococcus species DETECTED (A) NOT DETECTED Final    Comment: CRITICAL RESULT CALLED TO, READ BACK BY AND VERIFIED WITH: PHARMD MICHELLE L. [id number] FCP    Staphylococcus aureus (BCID) DETECTED (A) NOT  DETECTED Final    Comment: CRITICAL RESULT CALLED TO, READ BACK BY AND VERIFIED WITH: PHARMD MICHELLE L. [id number] FCP    Staphylococcus epidermidis NOT DETECTED NOT DETECTED Final   Staphylococcus lugdunensis NOT DETECTED NOT DETECTED Final   Streptococcus species NOT DETECTED NOT DETECTED Final   Streptococcus agalactiae NOT DETECTED NOT DETECTED Final   Streptococcus pneumoniae NOT DETECTED NOT DETECTED Final   Streptococcus pyogenes NOT DETECTED NOT DETECTED Final   A.calcoaceticus-baumannii NOT DETECTED NOT DETECTED Final   Bacteroides fragilis NOT DETECTED NOT DETECTED Final   Enterobacterales NOT DETECTED NOT DETECTED Final   Enterobacter cloacae complex NOT DETECTED NOT DETECTED Final   Escherichia coli NOT DETECTED NOT DETECTED Final   Klebsiella aerogenes NOT DETECTED NOT DETECTED Final   Klebsiella oxytoca  NOT DETECTED NOT DETECTED Final   Klebsiella pneumoniae NOT DETECTED NOT DETECTED Final   Proteus species NOT DETECTED NOT DETECTED Final   Salmonella species NOT DETECTED NOT DETECTED Final   Serratia marcescens NOT DETECTED NOT DETECTED Final   Haemophilus influenzae NOT DETECTED NOT DETECTED Final   Neisseria meningitidis NOT DETECTED NOT DETECTED Final   Pseudomonas aeruginosa NOT DETECTED NOT DETECTED Final   Stenotrophomonas maltophilia NOT DETECTED NOT DETECTED Final   Candida albicans NOT DETECTED NOT DETECTED Final   Candida auris NOT DETECTED NOT DETECTED Final   Candida glabrata NOT DETECTED NOT DETECTED Final   Candida krusei NOT DETECTED NOT DETECTED Final   Candida parapsilosis NOT DETECTED NOT DETECTED Final   Candida tropicalis NOT DETECTED NOT DETECTED Final   Cryptococcus neoformans/gattii NOT DETECTED NOT DETECTED Final   Meth resistant mecA/C and MREJ NOT DETECTED NOT DETECTED Final    Comment: Performed at [facility], [address] [place], [place] [address]  Blood Culture (routine x 2)     Status: Abnormal   Collection Time: 01/31/21  11:15 AM   Specimen: BLOOD  Result Value Ref Range Status   Specimen Description   Final    BLOOD RIGHT ANTECUBITAL Performed at [facility], [address] [place], [place] [address]    Special Requests   Final    BOTTLES DRAWN AEROBIC AND ANAEROBIC Blood Culture adequate volume Performed at [facility], [address] [place], [place] [address]    Culture  Setup Time   Final    GRAM POSITIVE COCCI IN CLUSTERS IN BOTH AEROBIC AND ANAEROBIC BOTTLES CRITICAL VALUE NOTED.  VALUE IS CONSISTENT WITH PREVIOUSLY REPORTED AND CALLED VALUE.    Culture (A)  Final    STAPHYLOCOCCUS AUREUS SUSCEPTIBILITIES PERFORMED ON PREVIOUS CULTURE WITHIN THE LAST 5 DAYS. Performed at [facility], [address], [place], [place] [address]    Report Status 02/03/2021 FINAL  Final  MRSA PCR Screening     Status: None   Collection Time: 01/31/21  2:28 PM   Specimen: Nasal Mucosa; Nasopharyngeal  Result Value Ref Range Status   MRSA by PCR NEGATIVE NEGATIVE Final    Comment:        The GeneXpert MRSA Assay (FDA approved for NASAL specimens only), is one component of a comprehensive MRSA colonization surveillance program. It is not intended to diagnose MRSA infection nor to guide or monitor treatment for MRSA infections. Performed at [facility], [address], [place], [place] [address]   Culture, blood (routine x 2)     Status: None   Collection Time: 02/01/21  2:20 PM   Specimen: BLOOD  Result Value Ref Range Status   Specimen Description   Final    BLOOD RIGHT ANTECUBITAL Performed at [facility], [address], [place], [place] [address]    Special Requests   Final    BOTTLES DRAWN AEROBIC AND ANAEROBIC Blood Culture adequate volume Performed at [facility], [address], [place], [place] [address]    Culture   Final    NO GROWTH 5 DAYS Performed at [facility],  [address], [place], [place] [address]    Report Status 02/06/2021 FINAL  Final  Culture, blood (routine x 2)     Status: None   Collection Time: 02/01/21  2:20 PM   Specimen: BLOOD RIGHT HAND  Result Value Ref Range Status   Specimen  Description   Final    BLOOD RIGHT HAND Performed at [facility], [address], [place], [place] [address]    Special Requests   Final    BOTTLES DRAWN AEROBIC AND ANAEROBIC Blood Culture adequate volume Performed at [facility], [address] [place], [place] [address]    Culture   Final    NO GROWTH 5 DAYS Performed at [facility], [address], [place], [place] [address]    Report Status 02/06/2021 FINAL  Final  Aerobic/Anaerobic Culture w Gram Stain (surgical/deep wound)     Status: None (Preliminary result)   Collection Time: 02/02/21  4:12 PM   Specimen: Abscess  Result Value Ref Range Status   Specimen Description   Final    ABSCESS SUBSCAPULAR Performed at [facility], [address] [place], [place] [address]    Special Requests   Final    NONE Performed at [facility], [address] [place], [place] [address]    Gram Stain   Final    ABUNDANT WBC PRESENT, PREDOMINANTLY PMN ABUNDANT GRAM POSITIVE COCCI IN CLUSTERS IN TETRADS Performed at [facility], [address], [place], [place] [address]    Culture   Final    ABUNDANT STAPHYLOCOCCUS AUREUS NO ANAEROBES ISOLATED; CULTURE IN PROGRESS FOR 5 DAYS    Report Status PENDING  Incomplete   Organism ID, Bacteria STAPHYLOCOCCUS AUREUS  Final      Susceptibility   Staphylococcus aureus - MIC*    CIPROFLOXACIN >=8 RESISTANT Resistant     ERYTHROMYCIN >=8 RESISTANT Resistant     GENTAMICIN <=0.5 SENSITIVE Sensitive     OXACILLIN 0.5 SENSITIVE Sensitive     TETRACYCLINE <=1 SENSITIVE Sensitive     VANCOMYCIN <=0.5 SENSITIVE Sensitive     TRIMETH/SULFA <=10 SENSITIVE Sensitive      CLINDAMYCIN <=0.25 SENSITIVE Sensitive     RIFAMPIN <=0.5 SENSITIVE Sensitive     Inducible Clindamycin NEGATIVE Sensitive     * ABUNDANT STAPHYLOCOCCUS AUREUS     Radiology Studies: ECHO TEE  Result Date: 02/06/2021    TRANSESOPHOGEAL ECHO REPORT   Patient Name:   DRAEDEN KELLMAN Date of Exam: 02/05/2021 Medical Rec #:  [id number]        Height:       70.0 in Accession #:    [id number]       Weight:       214.5 lb Date of Birth:  04-23-1966        BSA:          2.150 m Patient Age:    54 years         BP:           84/64 mmHg Patient Gender: M                HR:           96 bpm. Exam Location:  Inpatient Procedure: Transesophageal Echo, Cardiac Doppler and Color Doppler Indications:     Bacteremia  History:         Patient has prior history of Echocardiogram examinations, most                  recent 02/03/2021.  Sonographer:     Roosvelt Maser RDCS Referring Phys:  [id number] Manson Passey Diagnosing Phys: Armanda Magic MD PROCEDURE: After discussion of the risks and benefits of a TEE, an informed consent was obtained from  the patient. The transesophogeal probe was passed without difficulty through the esophogus of the patient. Local oropharyngeal anesthetic was provided with Cetacaine. Sedation performed by different physician. The patient was monitored while under deep sedation. Anesthestetic sedation was provided intravenously by Anesthesiology: 300mg  of Propofol, 40mg  of Lidocaine. Image quality was good. The patient's vital signs; including heart rate, blood pressure, and oxygen saturation; remained stable throughout the procedure. The patient developed no complications during the procedure. IMPRESSIONS  1. Left ventricular ejection fraction, by estimation, is 60 to 65%. The left ventricle has normal function. The left ventricle has no regional wall motion abnormalities.  2. Right ventricular systolic function is normal. The right ventricular size is normal.  3. No left atrial/left atrial appendage  thrombus was detected.  4. The mitral valve is normal in structure. Trivial mitral valve regurgitation. No evidence of mitral stenosis.  5. The tricuspid valve is abnormal. There is a large filamentous highly mobile density predominantly on the ventricular side of the TV leaflets consistent with vegetation with associated mild TR.  6. The aortic valve is normal in structure. Aortic valve regurgitation is trivial. No aortic stenosis is present.  7. The inferior vena cava is normal in size with greater than 50% respiratory variability, suggesting right atrial pressure of 3 mmHg. Conclusion(s)/Recommendation(s): Normal biventricular function with evidence of TV vegetation. FINDINGS  Left Ventricle: Left ventricular ejection fraction, by estimation, is 60 to 65%. The left ventricle has normal function. The left ventricle has no regional wall motion abnormalities. The left ventricular internal cavity size was normal in size. There is  no left ventricular hypertrophy. Right Ventricle: The right ventricular size is normal. No increase in right ventricular wall thickness. Right ventricular systolic function is normal. Left Atrium: Left atrial size was normal in size. No left atrial/left atrial appendage thrombus was detected. Right Atrium: Right atrial size was normal in size. Pericardium: There is no evidence of pericardial effusion. Mitral Valve: The mitral valve is normal in structure. Trivial mitral valve regurgitation. No evidence of mitral valve stenosis. Tricuspid Valve: There is a large filamentous highly mobile density predominantly on the ventricular side of the TV leaflets consistent with vegetation. There is possible TV prolapse present as well. The tricuspid valve is abnormal. Tricuspid valve regurgitation is mild . No evidence of tricuspid stenosis. Aortic Valve: The aortic valve is normal in structure. Aortic valve regurgitation is trivial. No aortic stenosis is present. Pulmonic Valve: The pulmonic valve was  normal in structure. Pulmonic valve regurgitation is not visualized. No evidence of pulmonic stenosis. Aorta: The aortic root is normal in size and structure. Venous: The inferior vena cava is normal in size with greater than 50% respiratory variability, suggesting right atrial pressure of 3 mmHg. IAS/Shunts: No atrial level shunt detected by color flow Doppler. Armanda Magicraci Turner MD Electronically signed by Armanda Magicraci Turner MD Signature Date/Time: 02/06/2021/8:52:07 AM    Final    MR BRAIN WO CONTRAST  Final Result    IR US Guide Bx Asp/Drain  Final Result    CT CHEST ABDOMEN PELVIS W CONTRAST  Final Result    DG Shoulder Left  Final Result    DG Shoulder Right  Final Result    MR THORACIC SPINE W WO CONTRAST  Final Result    DG Chest Port 1 View  Final Result    MR Shoulder Left W Wo Contrast    (Results Pending)  MR Shoulder Right W Wo Contrast    (Results Pending)    Scheduled Meds:  Chlorhexidine Gluconate Cloth  6 each Topical Daily   enoxaparin (LOVENOX) injection  40 mg Subcutaneous Q24H   gabapentin  800 mg Oral TID   insulin aspart  0-15 Units Subcutaneous TID WC   insulin aspart  0-5 Units Subcutaneous QHS   insulin glargine  10 Units Subcutaneous Daily   mouth rinse  15 mL Mouth Rinse BID   oxyCODONE  20 mg Oral Q12H   polyethylene glycol  17 g Oral Daily   senna-docusate  1 tablet Oral BID   PRN Meds: sodium chloride, acetaminophen, alum & mag hydroxide-simeth, docusate sodium, methocarbamol, morphine injection, ondansetron (ZOFRAN) IV, oxyCODONE, traZODone Continuous Infusions:  sodium chloride 10 mL/hr at 02/06/21 0800    ceFAZolin (ANCEF) IV Stopped (02/06/21 0539)     LOS: 6 days  Time spent: Greater than 50% of the 35 minute visit was spent in counseling/coordination of care for the patient as laid out in the A&P.   Silvano Bilis, MD [facility] 02/06/2021, 2:22 PM

## 2021-02-06 NOTE — Progress Notes (Signed)
[facility]    Date of Admission:  01/31/2021   Total days of antibiotics 7           ID: John Ware is a 55 y.o. male with MSSA disseminated disease, bacteremia, TV, septic pulm emboli, right subscapular- abscess and myositis Active Problems:   Diabetes mellitus (HCC)   Hyponatremia   Pressure injury of skin   Bacteremia due to Staphylococcus aureus   HTN (hypertension)   Anxiety   Left shoulder pain   Ascending aorta dilatation (HCC)   Vision changes   Endocarditis    Subjective: Still having fevers, bilateral shoulder pain,   Medications:   Chlorhexidine Gluconate Cloth  6 each Topical Daily   [START ON 02/07/2021] cosyntropin  0.25 mg Intravenous Once   enoxaparin (LOVENOX) injection  40 mg Subcutaneous Q24H   gabapentin  800 mg Oral TID   insulin aspart  0-15 Units Subcutaneous TID WC   insulin aspart  0-5 Units Subcutaneous QHS   [START ON 02/07/2021] insulin glargine  15 Units Subcutaneous Daily   mouth rinse  15 mL Mouth Rinse BID   oxyCODONE  20 mg Oral Q12H   polyethylene glycol  17 g Oral Daily   senna-docusate  1 tablet Oral BID    Objective: Vital signs in last 24 hours: Temp:  [97.3 F (36.3 C)-100.5 F (38.1 C)] 98.5 F (36.9 C) (06/14 1220) Pulse Rate:  [63-113] 101 (06/14 1228) Resp:  [13-24] 16 (06/14 1228) BP: (92-132)/(59-77) 110/77 (06/14 1228) SpO2:  [93 %-100 %] 94 % (06/14 1228) Physical Exam  Constitutional: He is oriented to person, place, and time. He appears well-developed and well-nourished. No distress.  HENT:  Mouth/Throat: Oropharynx is clear and moist. No oropharyngeal exudate.  Cardiovascular: Normal rate, regular rhythm and normal heart sounds. Exam reveals no gallop and no friction rub.  No murmur heard.  Pulmonary/Chest: Effort normal and breath sounds normal. No respiratory distress. He has no wheezes.  Abdominal: Soft. Bowel sounds are normal. He exhibits no distension. There is no  tenderness.  Back = right subscapular swelling/not indurated.  Neurological: He is alert and oriented to person, place, and time.  Skin: Skin is warm and dry. No rash noted. No erythema.  Psychiatric: He has a normal mood and affect. His behavior is normal.    Lab Results Recent Labs    02/05/21 0558 02/05/21 1648 02/06/21 0239  WBC 20.4*  --  22.3*  HGB 10.6*  --  10.5*  HCT 30.0*  --  29.9*  NA 127* 125* 125*  K 3.9 4.1 3.8  CL 91* 90* 91*  CO2 27 26 26   BUN 8 8 8   CREATININE 0.54* 0.54* 0.47*   Liver Panel Recent Labs    02/04/21 0300  PROT 5.1*  ALBUMIN 1.6*  AST 43*  ALT 21  ALKPHOS 95  BILITOT 1.1    Microbiology: 6/10 subscapular cx = MSSA 6/8 blood cx = MSSA  Studies/Results: ECHO TEE  Result Date: 02/06/2021    TRANSESOPHOGEAL ECHO REPORT   Patient Name:   John Ware Date of Exam: 02/05/2021 Medical Rec #:  John Ware        Height:       70.0 in Accession #:    02/07/2021       Weight:       214.5 lb Date of Birth:  1966/02/02        BSA:  2.150 m Patient Age:    54 years         BP:           84/64 mmHg Patient Gender: M                HR:           96 bpm. Exam Location:  Inpatient Procedure: Transesophageal Echo, Cardiac Doppler and Color Doppler Indications:     Bacteremia  History:         Patient has prior history of Echocardiogram examinations, most                  recent 02/03/2021.  Sonographer:     John Ware RDCS Referring Phys:  [id number] John Ware Diagnosing Phys: John Magic MD PROCEDURE: After discussion of the risks and benefits of a TEE, an informed consent was obtained from the patient. The transesophogeal probe was passed without difficulty through the esophogus of the patient. Local oropharyngeal anesthetic was provided with Cetacaine. Sedation performed by different physician. The patient was monitored while under deep sedation. Anesthestetic sedation was provided intravenously by Anesthesiology: 300mg  of Propofol, 40mg  of  Lidocaine. Image quality was good. The patient's vital signs; including heart rate, blood pressure, and oxygen saturation; remained stable throughout the procedure. The patient developed no complications during the procedure. IMPRESSIONS  1. Left ventricular ejection fraction, by estimation, is 60 to 65%. The left ventricle has normal function. The left ventricle has no regional wall motion abnormalities.  2. Right ventricular systolic function is normal. The right ventricular size is normal.  3. No left atrial/left atrial appendage thrombus was detected.  4. The mitral valve is normal in structure. Trivial mitral valve regurgitation. No evidence of mitral stenosis.  5. The tricuspid valve is abnormal. There is a large filamentous highly mobile density predominantly on the ventricular side of the TV leaflets consistent with vegetation with associated mild TR.  6. The aortic valve is normal in structure. Aortic valve regurgitation is trivial. No aortic stenosis is present.  7. The inferior vena cava is normal in size with greater than 50% respiratory variability, suggesting right atrial pressure of 3 mmHg. Conclusion(s)/Recommendation(s): Normal biventricular function with evidence of TV vegetation. FINDINGS  Left Ventricle: Left ventricular ejection fraction, by estimation, is 60 to 65%. The left ventricle has normal function. The left ventricle has no regional wall motion abnormalities. The left ventricular internal cavity size was normal in size. There is  no left ventricular hypertrophy. Right Ventricle: The right ventricular size is normal. No increase in right ventricular wall thickness. Right ventricular systolic function is normal. Left Atrium: Left atrial size was normal in size. No left atrial/left atrial appendage thrombus was detected. Right Atrium: Right atrial size was normal in size. Pericardium: There is no evidence of pericardial effusion. Mitral Valve: The mitral valve is normal in structure.  Trivial mitral valve regurgitation. No evidence of mitral valve stenosis. Tricuspid Valve: There is a large filamentous highly mobile density predominantly on the ventricular side of the TV leaflets consistent with vegetation. There is possible TV prolapse present as well. The tricuspid valve is abnormal. Tricuspid valve regurgitation is mild . No evidence of tricuspid stenosis. Aortic Valve: The aortic valve is normal in structure. Aortic valve regurgitation is trivial. No aortic stenosis is present. Pulmonic Valve: The pulmonic valve was normal in structure. Pulmonic valve regurgitation is not visualized. No evidence of pulmonic stenosis. Aorta: The aortic root is normal in size and structure. Venous:  The inferior vena cava is normal in size with greater than 50% respiratory variability, suggesting right atrial pressure of 3 mmHg. IAS/Shunts: No atrial level shunt detected by color flow Doppler. John Magic MD Electronically signed by John Magic MD Signature Date/Time: 02/06/2021/8:52:07 AM    Final      Assessment/Plan:  Disseminated MSSA infection with TV= concern that he still has undergoing nidus of infection. Awaiting shoulder MRI to see if signs of septic arthritis. Continue on cefazolin  Odondyphagia = will add Ware mouthwash to help discomfort  Leukocytosis = will add differential  [facility] Cell: [phone] Pager: [phone]  02/06/2021, 3:51 PM

## 2021-02-06 NOTE — Progress Notes (Signed)
Occupational Therapy Progress Note  Patient progressing with therapy, able to ambulate to/from bathroom to utilize toilet "I don't want the bedside commode." Patient needing min A x2 for safety as patient is highly distractible and tangential letting go of walker needing cues to keep hands on walker and focus on ambulation vs conversing. Patient has tendency to stop ambulating and start talking throughout session, impaired safety awareness. Also provide verbal cues for hand placement during sit <> stand from edge of bed and toilet. Patient still with limited L shoulder flexion/ROM, reports getting an MRI today. Continue to recommend acute OT to facilitate D/C to venue listed below.     02/06/21 1200  OT Visit Information  Last OT Received On 02/06/21  Assistance Needed +2 (safety and for bed mobility)  PT/OT/SLP Co-Evaluation/Treatment Yes  Reason for Co-Treatment Complexity of the patient's impairments (multi-system involvement);For patient/therapist safety;To address functional/ADL transfers;Necessary to address cognition/behavior during functional activity  PT goals addressed during session Mobility/safety with mobility  OT goals addressed during session ADL's and self-care  History of Present Illness Patient is a 55 y.o. male presented to ED with weakness and per wife generally unwell over the past 3 weeks. He had a positive home COVID test on 01/12/21. He had no respiratory symptoms at the time. He just had fevers that were responsive to APAP. He self isolated at home until 01/20/21 when he was at a party and experienced a fall. bil shoulder and spien xrays negative.  MRI brain=Punctate acute or subacute infarct versus artifact in the high left  frontal lobe. Otherwise, no evidence of recent infarction. shoulder MRI pending as of 02/06/21  PMH: significant of chronic pain, HTN, DM2, depression, spinal stenosis.  Precautions  Precautions Fall  Pain Assessment  Pain Assessment Faces  Faces Pain  Scale 6  Pain Location L shoulder, complaints of diffuse pain with mobility  Pain Descriptors / Indicators Guarding;Grimacing;Discomfort  Pain Intervention(s) Limited activity within patient's tolerance  Cognition  Arousal/Alertness Awake/alert  Behavior During Therapy WFL for tasks assessed/performed  Overall Cognitive Status Difficult to assess  Area of Impairment Attention;Memory;Following commands;Awareness;Problem solving  Current Attention Level Sustained  Memory Decreased recall of precautions;Decreased short-term memory  Following Commands Follows one step commands with increased time;Follows multi-step commands inconsistently  Awareness Emergent  Problem Solving Slow processing;Decreased initiation;Requires verbal cues  General Comments pt requires repetitious step by step cues, is easily distracted and requires frequent redirection to task  Difficult to assess due to  (high distractible)  ADL  Overall ADL's  Needs assistance/impaired  Toilet Transfer Minimal assistance;+2 for physical assistance;+2 for safety/equipment;Cueing for safety;Cueing for sequencing;Ambulation;RW;Regular Toilet;Grab bars  Toilet Transfer Details (indicate cue type and reason) patient is very distractible, tangential needing cues to redirect as well as for hand placement  Toileting - Clothing Manipulation Details (indicate cue type and reason) unable to have bowel movement at this time however total A in standing for washing peri area during bed bath with CNA present due to reliance of UE support  Functional mobility during ADLs Minimal assistance;+2 for safety/equipment;+2 for physical assistance;Cueing for safety;Cueing for sequencing;Rolling walker  Bed Mobility  Overal bed mobility Needs Assistance  Bed Mobility Supine to Sit  Supine to sit Mod assist;+2 for physical assistance;+2 for safety/equipment;HOB elevated  Balance  Overall balance assessment Needs assistance  Sitting-balance support Feet  supported;Bilateral upper extremity supported  Sitting balance-[name] Poor  Standing balance support During functional activity;Bilateral upper extremity supported  Standing balance-[name] Poor  Standing balance comment  reliant on UEs  Transfers  Overall transfer level Needs assistance  Equipment used Rolling walker (2 wheeled)  Transfers Sit to/from Stand  Sit to Stand Min assist;+2 physical assistance;+2 safety/equipment  General transfer comment patient requiring verbal cues for body mechanics and assist to power up to standing. frequently letting go of walker  and tangential needing cues to redirect  OT - End of Session  Equipment Utilized During Treatment Gait belt;Rolling walker  Activity Tolerance Patient tolerated treatment well  Patient left in chair;with call bell/phone within reach;with nursing/sitter in room  Nurse Communication Other (comment) (nursing present for session)  OT Assessment/Plan  OT Plan Discharge plan needs to be updated  OT Visit Diagnosis Unsteadiness on feet (R26.81);Other abnormalities of gait and mobility (R26.89);History of falling (Z91.81);Muscle weakness (generalized) (M62.81);Pain  Pain - Right/Left Left  Pain - part of body Shoulder  OT Frequency (ACUTE ONLY) Min 2X/week  Follow Up Recommendations SNF;Other (comment) ((CIR denied, rec if patient appeals))  OT Equipment Other (comment) (rolling walker)  AM-PAC OT "6 Clicks" Daily Activity Outcome Measure (Version 2)  Help from another person eating meals? 3  Help from another person taking care of personal grooming? 3  Help from another person toileting, which includes using toliet, bedpan, or urinal? 2  Help from another person bathing (including washing, rinsing, drying)? 2  Help from another person to put on and taking off regular upper body clothing? 2  Help from another person to put on and taking off regular lower body clothing? 2  6 Click Score 14  OT Goal Progression  Progress  towards OT goals Progressing toward goals  Acute Rehab OT Goals  Patient Stated Goal to regain strength  OT Goal Formulation With patient  Time For Goal Achievement 02/15/21  Potential to Achieve Goals Good  ADL Goals  Pt Will Perform Grooming with set-up;with supervision;standing  Pt Will Transfer to Toilet with min guard assist;ambulating;regular height toilet;grab bars  Pt Will Perform Toileting - Clothing Manipulation and hygiene with min guard assist;sit to/from stand  Additional ADL Goal #1 Patient will tolerate gentle PROM to bilateral shoulder and demonstrate understanding of HEP within two weeks.  Additional ADL Goal #2 Patient will demonstrate functional use of upper extremities with complaints of pain 3/10 or less within two weeks.  OT Time Calculation  OT Start Time (ACUTE ONLY) 1017  OT Stop Time (ACUTE ONLY) 1040  OT Time Calculation (min) 23 min  OT General Charges  $OT Visit 1 Visit  OT Treatments  $Self Care/Home Management  8-22 mins   Marlyce Huge OT OT pager: [phone]

## 2021-02-06 NOTE — Progress Notes (Signed)
Inpatient Rehab Admissions Coordinator:   Insurance has denied request for inpatient rehab. I will have reached out to Pt. And family to discuss whether or not they wish to appeal this decision. I await their response.  Megan Salon, MS, CCC-SLP Rehab Admissions Coordinator  [phone] (celll) [phone] (office)

## 2021-02-06 NOTE — Progress Notes (Signed)
Off unit to MRI accompanied by bedside RN.

## 2021-02-06 NOTE — Progress Notes (Signed)
Inpatient Diabetes Program Recommendations  AACE/ADA: New Consensus Statement on Inpatient Glycemic Control (2015)  Target Ranges:  Prepandial:   less than 140 mg/dL      Peak postprandial:   less than 180 mg/dL (1-2 hours)      Critically ill patients:  140 - 180 mg/dL   Lab Results  Component Value Date   GLUCAP 197 (H) 02/06/2021   HGBA1C 10.2 (H) 01/31/2021    Review of Glycemic Control Results for BOHDAN, MACHO (MRN [id number]) as of 02/06/2021 10:44  Ref. Range 02/05/2021 07:35 02/05/2021 11:10 02/05/2021 15:35 02/05/2021 21:21 02/06/2021 07:34  Glucose-Capillary Latest Ref Range: 70 - 99 mg/dL 623 (H) 762 (H) 831 (H) 190 (H) 197 (H)   Current orders for Inpatient glycemic control: Lantus 10 units daily Novolog 0-15 units TID and 0-5 units QHS  Inpatient Diabetes Program Recommendations:    Please consider: -  Increase Lantus to 14 units -  add Novolog 3 units TID with meals if consumes at least 50%.  Will continue to follow while inpatient.  Thanks,  Christena Deem RN, MSN, BC-ADM Inpatient Diabetes Coordinator Team Pager [phone] (8a-5p)

## 2021-02-06 NOTE — Progress Notes (Signed)
Physical Therapy Treatment Patient Details Name: John Ware MRN: [id number] DOB: 06-Aug-1966 Today's Date: 02/06/2021    History of Present Illness Patient is a 55 y.o. male presented to ED with weakness and per wife generally unwell over the past 3 weeks. He had a positive home COVID test on 01/12/21. He had no respiratory symptoms at the time. He just had fevers that were responsive to APAP. He self isolated at home until 01/20/21 when he was at a party and experienced a fall. bil shoulder and spien xrays negative.  MRI brain=Punctate acute or subacute infarct versus artifact in the high left  frontal lobe. Otherwise, no evidence of recent infarction. shoulder MRI pending as of 02/06/21  PMH: significant of chronic pain, HTN, DM2, depression, spinal stenosis.    PT Comments    Pt progressing toward PT goals. Able to amb incr distance however requiring multi-modal cues for safety, to follow basic commands as well as needing frequent redirection to task d/t decr attention (sustained). Recommend SNF as it is noted that ins denied. If pt and family choose to appeal, would recommend continue to recommend CIR    Follow Up Recommendations  SNF (ins denied CIR of course; rec CIR if pt appealing)     Equipment Recommendations  Other (comment) (TBD)    Recommendations for Other Services       Precautions / Restrictions Precautions Precautions: Fall Restrictions Weight Bearing Restrictions: No    Mobility  Bed Mobility Overal bed mobility: Needs Assistance Bed Mobility: Supine to Sit     Supine to sit: Mod assist;+2 for physical assistance;+2 for safety/equipment;HOB elevated     General bed mobility comments: pt requries increased time to initiate LE mobility to EOB. mod A of 2 to raise trunk, pivot hips to EOB and advance BLEs. Pt with limited ability to assist with BUEs 2* weakness/pain.    Transfers Overall transfer level: Needs assistance Equipment used: Rolling walker (2  wheeled) Transfers: Sit to/from Stand Sit to Stand: Min assist;+2 physical assistance;+2 safety/equipment         General transfer comment: VCs hand placement, assist to power up  Ambulation/Gait Ambulation/Gait assistance: Min assist;+2 safety/equipment Gait Distance (Feet): 15 Feet (x2) Assistive device: Rolling walker (2 wheeled) Gait Pattern/deviations: Step-through pattern;Decreased stride length;Wide base of support Gait velocity: decr   General Gait Details: pt requires cues for sequence, frequent redirection to task. intermittent assist to balance and maneuver RW +2 for lines, safety   Stairs             Wheelchair Mobility    Modified Rankin (Stroke Patients Only)       Balance   Sitting-balance support: Feet supported;Bilateral upper extremity supported Sitting balance-[name]: Fair     Standing balance support: During functional activity;Bilateral upper extremity supported Standing balance-[name]: Poor Standing balance comment: reliant on UEs                            Cognition Arousal/Alertness: Awake/alert Behavior During Therapy: WFL for tasks assessed/performed Overall Cognitive Status: Difficult to assess Area of Impairment: Attention;Memory;Following commands;Awareness;Problem solving                   Current Attention Level: Sustained Memory: Decreased recall of precautions;Decreased short-term memory Following Commands: Follows one step commands with increased time;Follows multi-step commands inconsistently   Awareness: Emergent Problem Solving: Slow processing;Decreased initiation;Requires verbal cues General Comments: pt requires repetitious step by step cues,  is easily distracted and requires frequent redirection to task      Exercises      General Comments        Pertinent Vitals/Pain Pain Assessment: Faces Faces Pain Scale: Hurts even more Pain Location: L shoulder, complaints of diffuse pain  with mobility Pain Descriptors / Indicators: Guarding;Grimacing;Discomfort Pain Intervention(s): Limited activity within patient's tolerance;Monitored during session;Repositioned    Home Living                      Prior Function            PT Goals (current goals can now be found in the care plan section) Acute Rehab PT Goals Patient Stated Goal: to regain strength PT Goal Formulation: With patient Time For Goal Achievement: 02/15/21 Potential to Achieve Goals: Good Progress towards PT goals: Progressing toward goals    Frequency    Min 3X/week      PT Plan Current plan remains appropriate    Co-evaluation PT/OT/SLP Co-Evaluation/Treatment: Yes Reason for Co-Treatment: Complexity of the patient's impairments (multi-system involvement);Necessary to address cognition/behavior during functional activity;For patient/therapist safety;To address functional/ADL transfers PT goals addressed during session: Mobility/safety with mobility;Proper use of DME OT goals addressed during session: ADL's and self-care      AM-PAC PT "6 Clicks" Mobility   Outcome Measure  Help needed turning from your back to your side while in a flat bed without using bedrails?: A Lot Help needed moving from lying on your back to sitting on the side of a flat bed without using bedrails?: Total Help needed moving to and from a bed to a chair (including a wheelchair)?: A Lot Help needed standing up from a chair using your arms (e.g., wheelchair or bedside chair)?: A Lot Help needed to walk in hospital room?: A Lot Help needed climbing 3-5 steps with a railing? : A Lot 6 Click Score: 11    End of Session Equipment Utilized During Treatment: Gait belt Activity Tolerance: Patient tolerated treatment well;Patient limited by fatigue Patient left: Other (comment);with call bell/phone within reach;with nursing/sitter in room (RN and NT in room, pt in restroom) Nurse Communication: Mobility status PT  Visit Diagnosis: Muscle weakness (generalized) (M62.81);Unsteadiness on feet (R26.81);History of falling (Z91.81);Difficulty in walking, not elsewhere classified (R26.2)     Time: 1017-1040 PT Time Calculation (min) (ACUTE ONLY): 23 min  Charges:  $Gait Training: 8-22 mins                     Delice Bison, PT  Acute Rehab Dept ([facility]) [phone] Pager [phone]  02/06/2021    [facility] 02/06/2021, 11:36 AM

## 2021-02-07 ENCOUNTER — Encounter (HOSPITAL_COMMUNITY): Payer: Self-pay | Admitting: Cardiology

## 2021-02-07 ENCOUNTER — Telehealth: Payer: Self-pay | Admitting: Internal Medicine

## 2021-02-07 DIAGNOSIS — I77810 Thoracic aortic ectasia: Secondary | ICD-10-CM | POA: Diagnosis not present

## 2021-02-07 DIAGNOSIS — F419 Anxiety disorder, unspecified: Secondary | ICD-10-CM | POA: Diagnosis not present

## 2021-02-07 DIAGNOSIS — R7881 Bacteremia: Secondary | ICD-10-CM | POA: Diagnosis not present

## 2021-02-07 DIAGNOSIS — A419 Sepsis, unspecified organism: Secondary | ICD-10-CM | POA: Diagnosis not present

## 2021-02-07 LAB — BASIC METABOLIC PANEL
Anion gap: 5 (ref 5–15)
BUN: 8 mg/dL (ref 6–20)
CO2: 29 mmol/L (ref 22–32)
Calcium: 7.7 mg/dL — ABNORMAL LOW (ref 8.9–10.3)
Chloride: 91 mmol/L — ABNORMAL LOW (ref 98–111)
Creatinine, Ser: 0.43 mg/dL — ABNORMAL LOW (ref 0.61–1.24)
GFR, Estimated: >60 mL/min (ref >60)
Glucose, Bld: 113 mg/dL — ABNORMAL HIGH (ref 70–99)
Potassium: 3.9 mmol/L (ref 3.5–5.1)
Sodium: 125 mmol/L — ABNORMAL LOW (ref 135–145)

## 2021-02-07 LAB — GLUCOSE, CAPILLARY
Glucose-Capillary: 136 mg/dL — ABNORMAL HIGH (ref 70–99)
Glucose-Capillary: 230 mg/dL — ABNORMAL HIGH (ref 70–99)
Glucose-Capillary: 219 mg/dL — ABNORMAL HIGH (ref 70–99)
Glucose-Capillary: 236 mg/dL — ABNORMAL HIGH (ref 70–99)

## 2021-02-07 LAB — AEROBIC/ANAEROBIC CULTURE W GRAM STAIN (SURGICAL/DEEP WOUND)

## 2021-02-07 LAB — CBC WITH DIFFERENTIAL/PLATELET
Abs Immature Granulocytes: 0.89 10*3/uL — ABNORMAL HIGH (ref 0.00–0.07)
Basophils Absolute: 0.1 10*3/uL (ref 0.0–0.1)
Basophils Relative: 0 %
Eosinophils Absolute: 0 10*3/uL (ref 0.0–0.5)
Eosinophils Relative: 0 %
HCT: 28.7 % — ABNORMAL LOW (ref 39.0–52.0)
Hemoglobin: 10.1 g/dL — ABNORMAL LOW (ref 13.0–17.0)
Immature Granulocytes: 4 %
Lymphocytes Relative: 7 %
Lymphs Abs: 1.4 10*3/uL (ref 0.7–4.0)
MCH: 30.8 pg (ref 26.0–34.0)
MCHC: 35.2 g/dL (ref 30.0–36.0)
MCV: 87.5 fL (ref 80.0–100.0)
Monocytes Absolute: 0.6 10*3/uL (ref 0.1–1.0)
Monocytes Relative: 3 %
Neutro Abs: 18.4 10*3/uL — ABNORMAL HIGH (ref 1.7–7.7)
Neutrophils Relative %: 86 %
Platelets: 427 10*3/uL — ABNORMAL HIGH (ref 150–400)
RBC: 3.28 MIL/uL — ABNORMAL LOW (ref 4.22–5.81)
RDW: 14.6 % (ref 11.5–15.5)
WBC: 21.4 10*3/uL — ABNORMAL HIGH (ref 4.0–10.5)
nRBC: 0 % (ref 0.0–0.2)

## 2021-02-07 LAB — TYPE AND SCREEN
ABO/RH(D): B POS
Antibody Screen: NEGATIVE

## 2021-02-07 LAB — ACTH STIMULATION, 3 TIME POINTS
Cortisol, 30 Min: 28.9 ug/dL
Cortisol, 60 Min: 31.9 ug/dL
Cortisol, Base: 19.4 ug/dL

## 2021-02-07 LAB — PHOSPHORUS: Phosphorus: 4.4 mg/dL (ref 2.5–4.6)

## 2021-02-07 LAB — SEDIMENTATION RATE: Sed Rate: 41 mm/hr — ABNORMAL HIGH (ref 0–16)

## 2021-02-07 LAB — C-REACTIVE PROTEIN: CRP: 15.5 mg/dL — ABNORMAL HIGH (ref <1.0)

## 2021-02-07 LAB — MAGNESIUM: Magnesium: 2.1 mg/dL (ref 1.7–2.4)

## 2021-02-07 MED ORDER — LIP MEDEX EX OINT
TOPICAL_OINTMENT | CUTANEOUS | Status: DC | PRN
  Filled 2021-02-07: qty 7

## 2021-02-07 NOTE — NC FL2 (Signed)
[place] MEDICAID FL2 LEVEL OF CARE SCREENING TOOL     IDENTIFICATION  Patient Name: John Ware Birthdate: 1966-03-02 Sex: male Admission Date (Current Location): 01/31/2021  [facility] and [place] Number:  Producer, television/film/video and Address:  [facility],  [address] 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   Vision changes 02/04/2021   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   Pressure injury of skin 02/01/2021   Bacteremia due to Staphylococcus aureus 02/01/2021   HTN (hypertension) 02/01/2021   Anxiety 02/01/2021   Left shoulder pain 02/01/2021   Hyponatremia 01/31/2021   Positive colorectal cancer screening using Cologuard test 12/28/2020   DOE (dyspnea on exertion) 10/20/2020   Diabetic polyneuropathy associated with type 2 diabetes mellitus (HCC) 10/09/2020   Diabetes mellitus (HCC) 10/30/2018   Lumbar stenosis with neurogenic claudication 10/14/2017   DDD (degenerative disc disease), lumbar 02/17/2017   Left leg weakness 02/05/2017   Hyperlipidemia associated with type 2 diabetes mellitus (HCC) 10/24/2016   Depression, recurrent (HCC) 10/24/2016   Right knee pain 05/21/2016   DDD (degenerative disc disease), lumbosacral 05/21/2016   Overweight (BMI 25.0-29.9) 05/21/2016   Lumbar stenosis 12/13/2014    Orientation RESPIRATION BLADDER Height & Weight     Self, Time, Situation, Place  Normal Continent Weight: 97.3 kg Height:  5\' 10"  (177.8 cm)  BEHAVIORAL SYMPTOMS/MOOD NEUROLOGICAL BOWEL NUTRITION STATUS      Continent Diet (regular)   AMBULATORY STATUS COMMUNICATION OF NEEDS Skin   Extensive Assist Verbally Normal                       Personal Care Assistance Level of Assistance  Bathing, Feeding, Dressing Bathing Assistance: Limited assistance Feeding assistance: Limited assistance Dressing Assistance: Limited assistance     Functional Limitations Info  Sight, Hearing, Speech Sight Info: Adequate Hearing Info: Adequate Speech Info: Adequate    SPECIAL CARE FACTORS FREQUENCY  PT (By licensed PT), OT (By licensed OT)     PT Frequency: 5 x weekly OT Frequency: 5 x weekly            Contractures Contractures Info: Not present    Additional Factors Info  Code Status, Allergies Code Status Info: full Allergies Info: tizanidine           Current Medications (02/07/2021):  This is the current hospital active medication list Current Facility-Administered Medications  Medication Dose Route Frequency Provider Last Rate Last Admin   0.9 %  sodium chloride infusion   Intravenous PRN 02/09/2021, MD   Paused at 02/07/21 02/09/21   acetaminophen (TYLENOL) tablet 500 mg  500 mg Oral Q4H PRN [id number], MD   500 mg at 02/07/21 0328   alum & mag hydroxide-simeth (MAALOX/MYLANTA) 200-200-20 MG/5ML suspension 30 mL  30 mL Oral Q4H PRN Mansy, Jan A, MD       ceFAZolin (ANCEF) IVPB 2g/100 mL premix  2 g Intravenous Q8H Mansy, Feb, MD   Stopped at 02/07/21 0538   Chlorhexidine Gluconate Cloth 2 % PADS 6 each  6  each Topical Daily Ronaldo Miyamoto, Tyrone A, DO   6 each at 02/06/21 1030   cosyntropin (CORTROSYN) injection 0.25 mg  0.25 mg Intravenous Once Wouk, Wilfred Curtis, MD       docusate sodium (COLACE) capsule 100 mg  100 mg Oral BID PRN Mansy, Jan A, MD   100 mg at 02/04/21 1409   enoxaparin (LOVENOX) injection 40 mg  40 mg Subcutaneous Q24H Kyle, Tyrone A, DO   40 mg at 02/06/21 2130   gabapentin (NEURONTIN) capsule 800 mg  800 mg Oral TID Margie Ege A, DO   800 mg at 02/06/21 2130   insulin aspart (novoLOG)  injection 0-15 Units  0-15 Units Subcutaneous TID WC Kyle, Tyrone A, DO   2 Units at 02/07/21 0749   insulin aspart (novoLOG) injection 0-5 Units  0-5 Units Subcutaneous QHS Kyle, Tyrone A, DO   2 Units at 02/04/21 2222   insulin glargine (LANTUS) injection 15 Units  15 Units Subcutaneous Daily Wouk, Wilfred Curtis, MD       magic mouthwash w/lidocaine  5 mL Oral QID PRN Judyann Munson, MD   5 mL at 02/06/21 1911   MEDLINE mouth rinse  15 mL Mouth Rinse BID Ronaldo Miyamoto, Tyrone A, DO   15 mL at 02/06/21 2131   methocarbamol (ROBAXIN) tablet 500 mg  500 mg Oral Q8H PRN Ronaldo Miyamoto, Tyrone A, DO   500 mg at 01/31/21 1531   morphine 2 MG/ML injection 2 mg  2 mg Intravenous Q4H PRN Lewie Chamber, MD   2 mg at 02/07/21 0749   ondansetron (ZOFRAN) injection 4 mg  4 mg Intravenous Q4H PRN Mansy, Jan A, MD       oxyCODONE (Oxy IR/ROXICODONE) immediate release tablet 5 mg  5 mg Oral Q4H PRN Ronaldo Miyamoto, Tyrone A, DO   5 mg at 02/07/21 0059   oxyCODONE (OXYCONTIN) 12 hr tablet 20 mg  20 mg Oral Q12H Kyle, Tyrone A, DO   20 mg at 02/06/21 2130   polyethylene glycol (MIRALAX / GLYCOLAX) packet 17 g  17 g Oral Daily Lewie Chamber, MD   17 g at 02/06/21 1047   senna-docusate (Senokot-S) tablet 1 tablet  1 tablet Oral BID Lewie Chamber, MD   1 tablet at 02/06/21 2130   traZODone (DESYREL) tablet 25 mg  25 mg Oral QHS PRN Mansy, Jan A, MD   25 mg at 02/06/21 2130     Discharge Medications: Please see discharge summary for a list of discharge medications.  Relevant Imaging Results:  Relevant Lab Results:   Additional Information ssn:[id number]  Golda Acre, RN

## 2021-02-07 NOTE — Progress Notes (Signed)
[facility]    Date of Admission:  01/31/2021   Total days of antibiotics 8/cefazolin           ID: John Ware is a 55 y.o. male with MSSA bacteremia/TV endocarditis c/b pulm sept emboli and L-subscapular  abscess Active Problems:   Diabetes mellitus (HCC)   Hyponatremia   Pressure injury of skin   Bacteremia due to Staphylococcus aureus   HTN (hypertension)   Anxiety   Left shoulder pain   Ascending aorta dilatation (HCC)   Vision changes   Endocarditis    Subjective: Right eye irritation ; still having fevers  - underwent bilateral MRI of shoulders - left shoulder concern for septic arthritis and L-axillary abscess Medications:   Chlorhexidine Gluconate Cloth  6 each Topical Daily   enoxaparin (LOVENOX) injection  40 mg Subcutaneous Q24H   gabapentin  800 mg Oral TID   insulin aspart  0-15 Units Subcutaneous TID WC   insulin aspart  0-5 Units Subcutaneous QHS   insulin glargine  15 Units Subcutaneous Daily   mouth rinse  15 mL Mouth Rinse BID   oxyCODONE  20 mg Oral Q12H   polyethylene glycol  17 g Oral Daily   senna-docusate  1 tablet Oral BID    Objective: Vital signs in last 24 hours: Temp:  [98 F (36.7 C)-101.4 F (38.6 C)] 98.3 F (36.8 C) (06/15 1200) Pulse Rate:  [82-109] 100 (06/15 1200) Resp:  [12-20] 16 (06/15 1200) BP: (102-133)/(33-87) 113/63 (06/15 1200) SpO2:  [89 %-96 %] 95 % (06/15 1200) Physical Exam  Constitutional: He is oriented to person, place, and time. He appears well-developed and well-nourished. No distress.  HENT:  Mouth/Throat: Oropharynx is clear and moist. No oropharyngeal exudate.  Cardiovascular: Normal rate, regular rhythm and normal heart sounds. Exam reveals no gallop and no friction rub.  No murmur heard.  Pulmonary/Chest: Effort normal and breath sounds normal. No respiratory distress. He has no wheezes.  Abdominal: Soft. Bowel sounds are normal. He exhibits no distension. There is no  tenderness.  Left posterior chest wall = still has firm induration. Neurological: He is alert and oriented to person, place, and time.  Skin: Skin is warm and dry. No rash noted. No erythema.  Psychiatric: He has a normal mood and affect. His behavior is normal.    Lab Results Recent Labs    02/06/21 0239 02/06/21 1653 02/07/21 0236 02/07/21 0805  WBC 22.3* 20.2*  --  21.4*  HGB 10.5* 10.8*  --  10.1*  HCT 29.9* 31.0*  --  28.7*  NA 125*  --  125*  --   K 3.8  --  3.9  --   CL 91*  --  91*  --   CO2 26  --  29  --   BUN 8  --  8  --   CREATININE 0.47*  --  0.43*  --      Microbiology: MSSA Studies/Results: MR Shoulder Right W Wo Contrast  Result Date: 02/07/2021 CLINICAL DATA:  Right shoulder pain and stiffness. History of MSSA bacteremia, septic pulmonary emboli, and endocarditis. EXAM: MRI OF THE RIGHT SHOULDER WITHOUT AND WITH CONTRAST TECHNIQUE: Multiplanar, multisequence MR imaging of the right shoulder was performed before and after the administration of intravenous contrast. CONTRAST:  52mL GADAVIST GADOBUTROL 1 MMOL/ML IV SOLN COMPARISON:  Right shoulder x-rays dated February 01, 2021. FINDINGS: Rotator cuff: Full-thickness, essentially full width tear of the supraspinatus tendon with 2.1  cm retraction to the top of the humeral head. High-grade partial-thickness articular surface tear of the mid to distal infraspinatus tendon. The teres minor and subscapularis tendons are intact. Muscles:  Mild infraspinatus muscle edema.  No muscle atrophy. Biceps long head: Partially torn within the bicipital groove. Normally positioned. Moderate tendinosis. Acromioclavicular Joint: Moderate arthropathy of the acromioclavicular joint. Type II acromion. Moderate amount of fluid in the subacromial/subdeltoid bursa extending into the subcoracoid bursa. Small amount of fluid superior to the acromioclavicular joint communicating with the subacromial/subdeltoid bursa through the joint space.  Glenohumeral Joint: Trace joint effusion. Diffuse cartilage thinning without focal defect. Labrum: Grossly intact, but evaluation is limited by lack of intraarticular fluid. Bones: No acute fracture or dislocation. No suspicious bone lesion. Other: None. IMPRESSION: 1. No convincing evidence of septic arthritis. Moderate amount of fluid in the subacromial/subdeltoid bursa and subcoracoid bursa is favored related to underlying rotator cuff pathology described below rather than septic bursitis, although fluid sampling could be obtained for definitive diagnosis. 2. Full-thickness, essentially full width tear of the supraspinatus tendon with 2.1 cm retraction to the top of the humeral head. No muscle atrophy. 3. High-grade partial-thickness articular surface tear of the mid to distal infraspinatus tendon. 4. Partially torn long head biceps tendon within the bicipital groove. 5. Moderate acromioclavicular and mild glenohumeral osteoarthritis. Electronically Signed   By: Obie Dredge M.D.   On: 02/07/2021 08:55   MR Shoulder Left W Wo Contrast  Result Date: 02/07/2021 CLINICAL DATA:  Left shoulder pain and stiffness. History of MSSA bacteremia, septic pulmonary emboli, and endocarditis. EXAM: MRI OF THE LEFT SHOULDER WITHOUT AND WITH CONTRAST TECHNIQUE: Multiplanar, multisequence MR imaging of the left shoulder was performed before and after the administration of intravenous contrast. CONTRAST:  48mL GADAVIST GADOBUTROL 1 MMOL/ML IV SOLN, 50mL GADAVIST GADOBUTROL 1 MMOL/ML IV SOLN COMPARISON:  Left shoulder x-rays dated February 01, 2021. FINDINGS: Rotator cuff: Full-thickness, partial width tear involving the anterior supraspinatus tendon with 2.2 cm retraction to the top of the humeral head. Additional high-grade partial-thickness articular surface tear of the posterior supraspinatus tendon. High-grade partial-thickness articular surface tear of the superior subscapularis tendon. The infraspinatus and teres minor  tendons are intact. Muscles:  Mild reactive rotator cuff muscle edema.  No atrophy. Biceps long head:  Completely torn within the bicipital groove. Acromioclavicular Joint: Moderate arthropathy of the acromioclavicular joint. Small joint effusion with thick synovial enhancement (series 12, image 5). Type II acromion. Large amount of fluid within the subacromial/subdeltoid bursa and subcoracoid bursa with synovial enhancement. Fluid extends proximally along the supraspinatus, infraspinatus, and subscapularis muscles. Glenohumeral Joint: Small joint effusion with thick synovial enhancement. Diffuse cartilage thinning without focal defect. Labrum:  Intact. Bones: Mild marrow edema and enhancement in the greater tuberosity with relatively preserved T1 marrow signal, likely reactive to rotator cuff pathology. No acute fracture or dislocation. No suspicious bone lesion. Other: Large rim enhancing fluid collection in the left axilla, incompletely visualized (series 12, image 12). IMPRESSION: 1. Findings concerning for glenohumeral and acromioclavicular septic arthritis with septic subacromial/subdeltoid and subcoracoid bursitis. No osteomyelitis. 2. Large abscess in the left axilla, incompletely visualized. 3. Full-thickness, partial width tear of the anterior supraspinatus tendon with 2.2 cm retraction to the top of the humeral head. Additional high-grade partial-thickness articular surface tear of the posterior supraspinatus tendon. 4. High-grade partial-thickness articular surface tear of the superior subscapularis tendon. 5. Complete tear of the biceps long head tendon within the bicipital groove. Electronically Signed   By: Obie Dredge  M.D.   On: 02/07/2021 08:42     Assessment/Plan: 55yo M with disseminated MSSA infection, still concern for source control give MRI findings of left shoulder  - coordinated with dr Dion Saucier to do washout tomorrow,  In addition, needs to have IR place drain on residual fluid  collection that may exist after wash out  - will change to nafcillin after the surgeyr tomorrow to see if that will help with treatment - will check sed rate and crp   Right eye irritation = did saline rinse at bedside. Appears improved    [facility] Cell: [phone] Pager: [phone]  02/07/2021, 4:24 PM

## 2021-02-07 NOTE — Telephone Encounter (Signed)
Noted. Dr. Jena Gauss, please review chart and advise if pt will need OV before we schedule for procedures?

## 2021-02-07 NOTE — Progress Notes (Signed)
PROGRESS NOTE    John Ware  JXB:[id number] DOB: 10-17-1965 DOA: 01/31/2021 PCP: Sharion Balloon, FNP   Brief Narrative:  Patient is a 55 year old obese Caucasian male with past medical history significant for but not limited to chronic pain, hypertension, diabetes mellitus type 2 as well as other comorbidities who presented with weakness.  History is obtained from the wife as he is a poor historian but she reported that he had been generally unwell for the past 3 weeks or so.  He had at home positive COVID test on 01/12/2021.  He had no respiratory symptoms at this time but he did have fevers that was responsive to Tylenol.  Reportedly he apparently fell out of his chair and hurt his left shoulder and he was evaluated by the ED and they found no fracture and he was given a sling and told to follow-up with orthopedics.  Since that time the patient's wife reports that he has had general body aches and pain and has increased symptoms that are not responsive to his chronic pain meds.  He is also had poor appetite during this time and has been unable to walk for last few days.  Because of these constantly some symptoms he is brought to the ED for further evaluation.  In the ED is found to have a sodium of 113 and a glucose of 339 and a WBC of 20.5 as well as a lactic acid of 3.8.  He started on broad-spectrum antibiotics and given fluids.  Soon after his blood cultures became positive for 4-4 MSSA.  He was transitioned to IV Ancef.  With further work-up and confirmation he was also found to have a large fluid collection of left lateral posterior chest and back.  He did have tenderness in his back.  He underwent MRI T-spine and CT of the chest abdomen pelvis.  There is no evidence of any osteomyelitis or discitis or other underlying infection on MRI T-spine.  CT of the chest did show a large multiloculated fluid collection.  IR was consulted and he underwent an ultrasound-guided aspiration was sent for  culturing.  CT was concerning for evidence of septic emboli but 2D echo did not reveal any evidence of an endocarditis.  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  Subsequently he still complained of some shoulder pain so he underwent bilateral MRIs of his shoulders and he is still having fevers.  At the left shoulder was concerned for septic arthritis and left axillary abscess.  Orthopedics was reconsulted as well as IR and the orthopedic surgeon is planning to do a washout tomorrow in edition IR is going to place a drain on the residual fluid collection that may exist after washout.  Because of the surgery IDs were changed to nafcillin and check a ESR and CRP.   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 This would be start date of abx likely. Final rec's per ID for length of duration of Ancef, likely 6 weeks; he would be a candidate for a PICC and if accepted to CIR they could continue abx there if approved but he is not medically stable yet    Bacteremia due to Staphylococcus aureus - 4/4 blood cultures on admission growing MSSA -Repeated blood cultures on 02/01/2021; NGTD - TTE negative for vegetations; cardiology consulted for TEE, 02/05/21: POSITIVE for TV vegetation - now s/p IR drainage of left posterior chest wall fluid collection which was noted to be purulent in nature;  culture also growing MSSA - MRI T spine 02/01/21 negative for OM/discitis  - MRI brain 6/12 negative for pathology in visual cortex to explain "floaters" in visual fields - continue ancef and will be transitioned to Nafcillin for shoulder washout  - ID following and appreciate recc's    Hyponatremia - Initially presumed due to dehydration and hypokalemia - severely low on admission, 113 - stabilizing out in mid-upper 120s with rehydration. Today appears a bit fluid overloaded and is tolerating good po. Na Remains 125 again today - will check tsh, repeat osm and urine osm, urine sodium. Cortisol wnl when checked earlier this admission, will repeat tomorrow with acth stim test. Reset osmostat possible - may need fluid restriction   Left shoulder pain - See work-up above - Was previously seen in the ER after his fall and recommended for a sling with outpatient Ortho follow-up -Bilateral shoulder x-rays unremarkable for now.   - MRI of shoulders to be performed today to eval for abscess/osteo; Left Shoulder MRI showed "Findings concerning for glenohumeral and acromioclavicular septic arthritis with septic subacromial/subdeltoid and  subcoracoid bursitis. No osteomyelitis. Large abscess in the left axilla, incompletely visualized. Full-thickness, partial width tear of the anterior supraspinatus tendon with 2.2 cm retraction to the top of  the humeral head. Additional high-grade partial-thickness articular surface tear of the posterior  supraspinatus tendon. High-grade partial-thickness articular surface tear of the superior subscapularis  tendon. complete tear of the biceps long head tendon within the bicipital groove." -Right should MRI done showed "No convincing evidence of septic arthritis. Moderate amount of fluid in  the subacromial/subdeltoid bursa and subcoracoid bursa is favored related to underlying rotator cuff  pathology described below rather than septic bursitis, although fluid sampling could  be obtained for  definitive diagnosis. Full-thickness, essentially full width tear of the supraspinatus tendon with 2.1 cm retraction to the top of the humeral head. No muscle atrophy. High-grade partial-thickness articular  surface tear of the mid to distal infraspinatus tendon. Partially torn long head biceps tendon within the  bicipital groove. Moderate acromioclavicular and mild glenohumeral osteoarthritis." -Orthopedic surgery evaluated and his left shoulder due to his complex constellation of infection and they feel he may need multiple operations.  The plan is for I&D and there may be more that needs to be drained even postop from his chest wall and will refer to IR based on his postop imaging.  Per Dr. Mardelle Matte he may be able to decompress the entirety of his shoulder with an open approach release give more thorough washout open so we will plan with that for wound VAC and iron has also been consulted to regarding axilla abscess to place drain.   Vision changes - Patient now stating on 02/04/2021 that he has been seeing "black floaters" in his field of vision for several days.  When questioned why he did  not bring this up sooner, he did not have a good answer and did not want to "trouble [place]" - ophtho evaluated 6/13, no endophthalmitis   DKA (diabetic ketoacidosis) (HCC)-resolved as of 02/02/2021 - present on admission - responded to fluids and insulin - now resolved   Ascending aorta dilatation (HCC) - TTE on 6/10 notes mild dilation of ascending aorta, 38 mm - follow TEE results as well: normal thoracic and ascending aorta noted on 02/05/21   Anxiety - Wellbutrin and Zoloft on hold for now in setting of hyponatremia   HTN (hypertension) - Pressure normal on admission.  Will treat if needed   Diabetes mellitus ([place]) - Continue SSI and CBG monitoring -Continue Lantus, will increase given elevated fasting glucose - A1c 10.2% on 01/31/2021 -CBGs ranging from 937-169   Obesity -Complicates  overall prognosis and care -Estimated body mass index is 30.78 kg/m as calculated from the following:   Height as of this encounter: _0  (1.778 m).   Weight as of this encounter: 97.3 kg. -Weight Loss and Dietary Counseling given    DVT prophylaxis: Enoxaparin 40 mg subcu every 24 Code Status: FULL CODE  Family Communication: Attempted to call the wife but she did not pick up her phone as it went straight to voicemail Disposition Plan: Pending further work-up and clearance  Status is: Inpatient  Remains inpatient appropriate because:Unsafe d/c plan, IV treatments appropriate due to intensity of illness or inability to take PO, and Inpatient level of care appropriate due to severity of illness  Dispo: The patient is from: Home              Anticipated d/c is to: CIR              Patient currently is not medically stable to d/c.   Difficult to place patient No  Consultants:  Infectious Diseases Orthopedic Surgery Interventional Radiology  CIR Cardiology  Procedures:  TEE Results: Normal LV size and function Normal RV size and function Normal RA Normal LA and LA appendage Abnormal TV with large filamentous highly mobile density predominantly on the ventricular side of the TV leaflets consistent with vegetation.  There is mild associated TR. Normal PV Normal MV with trivial MR Normal trileaflet AV with trivial AR Normal interatrial septum with no evidence of shunt by colorflow dopper Normal thoracic and ascending aorta.    Antimicrobials:  Anti-infectives (From admission, onward)    Start     Dose/Rate Route Frequency Ordered Stop   02/01/21 0600  ceFAZolin (ANCEF) IVPB 2g/100 mL premix        2 g 200 mL/hr over 30 Minutes Intravenous Every 8 hours 02/01/21 0414     01/31/21 1800  cefTRIAXone (ROCEPHIN) 1 g in sodium chloride 0.9 % 100 mL IVPB  Status:  Discontinued        1 g 200 mL/hr over 30 Minutes Intravenous Every 24 hours 01/31/21 1453 02/01/21 0425    01/31/21 1115  vancomycin (VANCOREADY) IVPB 2000 mg/400 mL        2,000 mg 200 mL/hr over 120 Minutes Intravenous  Once 01/31/21 1105 01/31/21 1320   01/31/21 1045  ceFEPIme (MAXIPIME) 2 g in sodium chloride 0.9 % 100 mL IVPB        2 g 200 mL/hr over 30 Minutes Intravenous  Once 01/31/21 1035 01/31/21 1129   01/31/21 1045  metroNIDAZOLE (FLAGYL) IVPB 500 mg        500 mg 100 mL/hr over 60 Minutes Intravenous  Once  01/31/21 1035 01/31/21 1208   01/31/21 1045  vancomycin (VANCOCIN) IVPB 1000 mg/200 mL premix  Status:  Discontinued        1,000 mg 200 mL/hr over 60 Minutes Intravenous  Once 01/31/21 1035 01/31/21 1105        Subjective: Seen and examined at bedside and he is still complaining about some pain specifically in his shoulders.  States he did not sleep very well.  No nausea or vomiting.  Feels okay.  Denies any lightheadedness or dizziness.  No other concerns or complaints at this time.  Objective: Vitals:   02/07/21 0802 02/07/21 1000 02/07/21 1200 02/07/21 1600  BP:  116/64 113/63 (!) 96/57  Pulse:  (!) 109 100 90  Resp:  _0 Temp:   98.3 F (36.8 C) 99.2 F (37.3 C)  TempSrc:   Oral Oral  SpO2: 95% (!) 89% 95% 91%  Weight:      Height:        Intake/Output Summary (Last 24 hours) at 02/07/2021 1936 Last data filed at 02/07/2021 1800 Gross per 24 hour  Intake 1217.49 ml  Output 3300 ml  Net -2082.51 ml   Filed Weights   01/31/21 1400 02/05/21 0500  Weight: 89.9 kg 97.3 kg   Examination: Physical Exam:  Constitutional: WN/WD obese Caucasian male currently in NAD appears mildly uncomfortable Eyes: Lids and conjunctivae normal, sclerae anicteric  ENMT: External Ears, Nose appear normal. Grossly normal hearing.  Neck: Appears normal, supple, no cervical masses, normal ROM, no appreciable thyromegaly; no JVD Respiratory: Diminished to auscultation bilaterally, no wheezing, rales, rhonchi or crackles. Normal respiratory effort and patient is not  tachypenic. No accessory muscle use.  Cardiovascular: RRR, no murmurs / rubs / gallops. S1 and S2 auscultated.  1+ lower extremity edema Abdomen: Soft, non-tender, distended secondary body habitus. Bowel sounds positive.  GU: Deferred. Musculoskeletal: No clubbing / cyanosis of digits/nails. No joint deformity upper and lower extremities.  Skin: No rashes, lesions, ulcers on a limited skin evaluation. No induration; Warm and dry.  Neurologic: CN 2-12 grossly intact with no focal deficits.  Romberg sign cerebellar reflexes not assessed.  Psychiatric: Normal judgment and insight. Alert and oriented x 3. Normal mood and appropriate affect.   Data Reviewed: I have personally reviewed following labs and imaging studies  CBC: Recent Labs  Lab 02/04/21 0305 02/05/21 0558 02/06/21 0239 02/06/21 1653 02/07/21 0805  WBC 15.3* 20.4* 22.3* 20.2* 21.4*  NEUTROABS 12.3* 16.0* 18.1* 17.4* 18.4*  HGB 11.6* 10.6* 10.5* 10.8* 10.1*  HCT 32.4* 30.0* 29.9* 31.0* 28.7*  MCV 85.3 85.5 86.4 88.3 87.5  PLT 429* 425* 397 414* 503*   Basic Metabolic Panel: Recent Labs  Lab 02/03/21 0029 02/03/21 0550 02/04/21 0305 02/04/21 1645 02/05/21 0558 02/05/21 1648 02/06/21 0239 02/07/21 0236 02/07/21 0805  NA 125*   < >  --  125* 127* 125* 125* 125*  --   K 3.6   < >  --  3.6 3.9 4.1 3.8 3.9  --   CL 90*   < >  --  92* 91* 90* 91* 91*  --   CO2 25   < >  --  _1 --   GLUCOSE 242*   < >  --  183* 174* 246* 176* 113*  --   BUN 16   < >  --  _2 --   CREATININE 0.58*   < >  --  0.49*  0.54* 0.54* 0.47* 0.43*  --   CALCIUM 7.7*   < >  --  7.5* 7.6* 7.6* 7.6* 7.7*  --   MG 2.3  --  2.2  --  2.1  --  2.1  --  2.1  PHOS 2.7  --  2.9  --  3.3  --  4.2  --  4.4   < > = values in this interval not displayed.   GFR: Estimated Creatinine Clearance: 124.9 mL/min (A) (by C-G formula based on SCr of 0.43 mg/dL (L)). Liver Function Tests: Recent Labs  Lab 02/01/21 0637 02/01/21 1420  02/01/21 1743 02/02/21 1211 02/04/21 0300  AST  --   --   --  71* 43*  ALT  --   --   --  39 21  ALKPHOS  --   --   --  106 95  BILITOT  --   --   --  1.1 1.1  PROT  --   --   --  6.0* 5.1*  ALBUMIN 1.8* 2.0* 2.0* 2.1* 1.6*   No results for input(s): LIPASE, AMYLASE in the last 168 hours. No results for input(s): AMMONIA in the last 168 hours. Coagulation Profile: Recent Labs  Lab 02/01/21 0022  INR 1.3*   Cardiac Enzymes: No results for input(s): CKTOTAL, CKMB, CKMBINDEX, TROPONINI in the last 168 hours. BNP (last 3 results) No results for input(s): PROBNP in the last 8760 hours. HbA1C: No results for input(s): HGBA1C in the last 72 hours. CBG: Recent Labs  Lab 02/06/21 1707 02/06/21 2128 02/07/21 0720 02/07/21 1153 02/07/21 1617  GLUCAP 266* 178* 136* 230* 219*   Lipid Profile: No results for input(s): CHOL, HDL, LDLCALC, TRIG, CHOLHDL, LDLDIRECT in the last 72 hours. Thyroid Function Tests: Recent Labs    02/06/21 1526  TSH 5.745*   Anemia Panel: No results for input(s): VITAMINB12, FOLATE, FERRITIN, TIBC, IRON, RETICCTPCT in the last 72 hours. Sepsis Labs: Recent Labs  Lab 02/04/21 0739  LATICACIDVEN 1.5    Recent Results (from the past 240 hour(s))  Urine culture     Status: Abnormal   Collection Time: 01/31/21  8:51 AM   Specimen: In/Out Cath Urine  Result Value Ref Range Status   Specimen Description   Final    IN/OUT CATH URINE Performed at [facility], [place] [address], [place], [place] [address]    Special Requests   Final    NONE Performed at [facility], [place] [address], [place], [place] [address]    Culture >=100,000 COLONIES/mL STAPHYLOCOCCUS AUREUS (A)  Final   Report Status 02/02/2021 FINAL  Final   Organism ID, Bacteria STAPHYLOCOCCUS AUREUS (A)  Final      Susceptibility   Staphylococcus aureus - MIC*    CIPROFLOXACIN >=8 RESISTANT Resistant     GENTAMICIN <=0.5 SENSITIVE Sensitive      NITROFURANTOIN <=16 SENSITIVE Sensitive     OXACILLIN 0.5 SENSITIVE Sensitive     TETRACYCLINE <=1 SENSITIVE Sensitive     VANCOMYCIN 1 SENSITIVE Sensitive     TRIMETH/SULFA <=10 SENSITIVE Sensitive     CLINDAMYCIN <=0.25 SENSITIVE Sensitive     RIFAMPIN <=0.5 SENSITIVE Sensitive     Inducible Clindamycin NEGATIVE Sensitive     * >=100,000 COLONIES/mL STAPHYLOCOCCUS AUREUS  Resp Panel by RT-PCR (Flu A&B, Covid) Nasopharyngeal Swab     Status: Abnormal   Collection Time: 01/31/21 10:59 AM   Specimen: Nasopharyngeal Swab; Nasopharyngeal(NP) swabs in vial transport medium  Result Value  Ref Range Status   SARS Coronavirus 2 by RT PCR POSITIVE (A) NEGATIVE Final    Comment: RESULT CALLED TO, READ BACK BY AND VERIFIED WITH: GARRISON,G. RN AT 1206 01/31/21 MULLINS,T (NOTE) SARS-CoV-2 target nucleic acids are DETECTED.  The SARS-CoV-2 RNA is generally detectable in upper respiratory specimens during the acute phase of infection. Positive results are indicative of the presence of the identified virus, but do not rule out bacterial infection or co-infection with other pathogens not detected by the test. Clinical correlation with patient history and other diagnostic information is necessary to determine patient infection status. The expected result is Negative.  Fact Sheet for Patients: EntrepreneurPulse.com.au  Fact Sheet for Healthcare Providers: IncredibleEmployment.be  This test is not yet approved or cleared by the [place] FDA and  has been authorized for detection and/or diagnosis of SARS-CoV-2 by FDA under an Emergency Use Authorization (EUA).  This EUA will remain in effect (meaning this tes t can be used) for the duration of  the COVID-19 declaration under Section 564(b)(1) of the Act, 21 U.S.C. section 360bbb-3(b)(1), unless the authorization is terminated or revoked sooner.     Influenza A by PCR NEGATIVE NEGATIVE Final   Influenza B  by PCR NEGATIVE NEGATIVE Final    Comment: (NOTE) The Xpert Xpress SARS-CoV-2/FLU/RSV plus assay is intended as an aid in the diagnosis of influenza from Nasopharyngeal swab specimens and should not be used as a sole basis for treatment. Nasal washings and aspirates are unacceptable for Xpert Xpress SARS-CoV-2/FLU/RSV testing.  Fact Sheet for Patients: EntrepreneurPulse.com.au  Fact Sheet for Healthcare Providers: IncredibleEmployment.be  This test is not yet approved or cleared by the [place] FDA and has been authorized for detection and/or diagnosis of SARS-CoV-2 by FDA under an Emergency Use Authorization (EUA). This EUA will remain in effect (meaning this test can be used) for the duration of the COVID-19 declaration under Section 564(b)(1) of the Act, 21 U.S.C. section 360bbb-3(b)(1), unless the authorization is terminated or revoked.  Performed at [facility], [place] [address], [place], [place] [address]   Blood Culture (routine x 2)     Status: Abnormal   Collection Time: 01/31/21 10:59 AM   Specimen: BLOOD  Result Value Ref Range Status   Specimen Description   Final    BLOOD LEFT ANTECUBITAL Performed at [place] [address], [place], [place] [address]    Special Requests   Final    BOTTLES DRAWN AEROBIC AND ANAEROBIC Blood Culture adequate volume Performed at [place] [address], [place], [place] [address]    Culture  Setup Time   Final    GRAM POSITIVE COCCI IN CLUSTERS IN BOTH AEROBIC AND ANAEROBIC BOTTLES CRITICAL RESULT CALLED TO, READ BACK BY AND VERIFIED WITH: Shepherd L. [id number] FCP Performed at [facility], [place] [address] [place], [place] [address]    Culture STAPHYLOCOCCUS AUREUS (A)  Final   Report Status 02/04/2021 FINAL  Final   Organism ID, Bacteria STAPHYLOCOCCUS AUREUS  Final      Susceptibility   Staphylococcus  aureus - MIC*    CIPROFLOXACIN >=8 RESISTANT Resistant     ERYTHROMYCIN >=8 RESISTANT Resistant     GENTAMICIN <=0.5 SENSITIVE Sensitive     OXACILLIN 0.5 SENSITIVE Sensitive     TETRACYCLINE <=1 SENSITIVE Sensitive     VANCOMYCIN <=0.5 SENSITIVE Sensitive     TRIMETH/SULFA <=10 SENSITIVE Sensitive     CLINDAMYCIN <=0.25 SENSITIVE Sensitive  RIFAMPIN <=0.5 SENSITIVE Sensitive     Inducible Clindamycin NEGATIVE Sensitive     * STAPHYLOCOCCUS AUREUS  Blood Culture ID Panel (Reflexed)     Status: Abnormal   Collection Time: 01/31/21 10:59 AM  Result Value Ref Range Status   Enterococcus faecalis NOT DETECTED NOT DETECTED Final   Enterococcus Faecium NOT DETECTED NOT DETECTED Final   Listeria monocytogenes NOT DETECTED NOT DETECTED Final   Staphylococcus species DETECTED (A) NOT DETECTED Final    Comment: CRITICAL RESULT CALLED TO, READ BACK BY AND VERIFIED WITH: PHARMD MICHELLE L. [id number] FCP    Staphylococcus aureus (BCID) DETECTED (A) NOT DETECTED Final    Comment: CRITICAL RESULT CALLED TO, READ BACK BY AND VERIFIED WITH: PHARMD MICHELLE L. [id number] FCP    Staphylococcus epidermidis NOT DETECTED NOT DETECTED Final   Staphylococcus lugdunensis NOT DETECTED NOT DETECTED Final   Streptococcus species NOT DETECTED NOT DETECTED Final   Streptococcus agalactiae NOT DETECTED NOT DETECTED Final   Streptococcus pneumoniae NOT DETECTED NOT DETECTED Final   Streptococcus pyogenes NOT DETECTED NOT DETECTED Final   A.calcoaceticus-baumannii NOT DETECTED NOT DETECTED Final   Bacteroides fragilis NOT DETECTED NOT DETECTED Final   Enterobacterales NOT DETECTED NOT DETECTED Final   Enterobacter cloacae complex NOT DETECTED NOT DETECTED Final   Escherichia coli NOT DETECTED NOT DETECTED Final   Klebsiella aerogenes NOT DETECTED NOT DETECTED Final   Klebsiella oxytoca NOT DETECTED NOT DETECTED Final   Klebsiella pneumoniae NOT DETECTED NOT DETECTED Final   Proteus species NOT DETECTED  NOT DETECTED Final   Salmonella species NOT DETECTED NOT DETECTED Final   Serratia marcescens NOT DETECTED NOT DETECTED Final   Haemophilus influenzae NOT DETECTED NOT DETECTED Final   Neisseria meningitidis NOT DETECTED NOT DETECTED Final   Pseudomonas aeruginosa NOT DETECTED NOT DETECTED Final   Stenotrophomonas maltophilia NOT DETECTED NOT DETECTED Final   Candida albicans NOT DETECTED NOT DETECTED Final   Candida auris NOT DETECTED NOT DETECTED Final   Candida glabrata NOT DETECTED NOT DETECTED Final   Candida krusei NOT DETECTED NOT DETECTED Final   Candida parapsilosis NOT DETECTED NOT DETECTED Final   Candida tropicalis NOT DETECTED NOT DETECTED Final   Cryptococcus neoformans/gattii NOT DETECTED NOT DETECTED Final   Meth resistant mecA/C and MREJ NOT DETECTED NOT DETECTED Final    Comment: Performed at [facility], [address], [place], [place] [address]  Blood Culture (routine x 2)     Status: Abnormal   Collection Time: 01/31/21 11:15 AM   Specimen: BLOOD  Result Value Ref Range Status   Specimen Description   Final    BLOOD RIGHT ANTECUBITAL Performed at [place] [address], [place], [place] [address]    Special Requests   Final    BOTTLES DRAWN AEROBIC AND ANAEROBIC Blood Culture adequate volume Performed at [place] [address] [place], [place] [address]    Culture  Setup Time   Final    GRAM POSITIVE COCCI IN CLUSTERS IN BOTH AEROBIC AND ANAEROBIC BOTTLES CRITICAL VALUE NOTED.  VALUE IS CONSISTENT WITH PREVIOUSLY REPORTED AND CALLED VALUE.    Culture (A)  Final    STAPHYLOCOCCUS AUREUS SUSCEPTIBILITIES PERFORMED ON PREVIOUS CULTURE WITHIN THE LAST 5 DAYS. Performed at [facility], [place] [address] [place], [place] [address]    Report Status 02/03/2021 FINAL  Final  MRSA PCR Screening     Status: None   Collection Time: 01/31/21  2:28 PM  Specimen: Nasal Mucosa; Nasopharyngeal  Result Value  Ref Range Status   MRSA by PCR NEGATIVE NEGATIVE Final    Comment:        The GeneXpert MRSA Assay (FDA approved for NASAL specimens only), is one component of a comprehensive MRSA colonization surveillance program. It is not intended to diagnose MRSA infection nor to guide or monitor treatment for MRSA infections. Performed at [facility], [place] [address] [place], [place] [address]   Culture, blood (routine x 2)     Status: None   Collection Time: 02/01/21  2:20 PM   Specimen: BLOOD  Result Value Ref Range Status   Specimen Description   Final    BLOOD RIGHT ANTECUBITAL Performed at [place] [address], [place], [place] [address]    Special Requests   Final    BOTTLES DRAWN AEROBIC AND ANAEROBIC Blood Culture adequate volume Performed at [place] [address], [place], [place] [address]    Culture   Final    NO GROWTH 5 DAYS Performed at [facility], [place] [address], [place], [place] [address]    Report Status 02/06/2021 FINAL  Final  Culture, blood (routine x 2)     Status: None   Collection Time: 02/01/21  2:20 PM   Specimen: BLOOD RIGHT HAND  Result Value Ref Range Status   Specimen Description   Final    BLOOD RIGHT HAND Performed at [facility] [address], [place], [place] [address]    Special Requests   Final    BOTTLES DRAWN AEROBIC AND ANAEROBIC Blood Culture adequate volume Performed at [facility] [address], [place], [place] [address]    Culture   Final    NO GROWTH 5 DAYS Performed at [facility], [place] [address], [place], [place] [address]    Report Status 02/06/2021 FINAL  Final  Aerobic/Anaerobic Culture w Gram Stain (surgical/deep wound)     Status: None   Collection Time: 02/02/21  4:12 PM   Specimen: Abscess  Result Value Ref Range Status   Specimen Description   Final    ABSCESS SUBSCAPULAR Performed at [facility] [address], [place], [place] [address]    Special Requests   Final    NONE Performed at [facility], [place] [address] [place], [place] [address]    Gram Stain   Final    ABUNDANT WBC PRESENT, PREDOMINANTLY PMN ABUNDANT GRAM POSITIVE COCCI IN CLUSTERS IN TETRADS    Culture   Final    ABUNDANT STAPHYLOCOCCUS AUREUS NO ANAEROBES ISOLATED Performed at [facility], [place] [address], [place], [place] [address]    Report Status 02/07/2021 FINAL  Final   Organism ID, Bacteria STAPHYLOCOCCUS AUREUS  Final      Susceptibility   Staphylococcus aureus - MIC*    CIPROFLOXACIN >=8 RESISTANT Resistant     ERYTHROMYCIN >=8 RESISTANT Resistant     GENTAMICIN <=0.5 SENSITIVE Sensitive     OXACILLIN 0.5 SENSITIVE Sensitive     TETRACYCLINE <=1 SENSITIVE Sensitive     VANCOMYCIN <=0.5 SENSITIVE Sensitive     TRIMETH/SULFA <=10 SENSITIVE Sensitive     CLINDAMYCIN <=0.25 SENSITIVE Sensitive     RIFAMPIN <=0.5 SENSITIVE Sensitive     Inducible Clindamycin NEGATIVE Sensitive     * ABUNDANT STAPHYLOCOCCUS AUREUS     RN Pressure Injury Documentation: Pressure Injury 01/31/21 Buttocks Right;Left;Posterior Stage  2 -  Partial thickness loss of dermis presenting as a shallow open injury with a red, pink wound bed without slough. (Active)  01/31/21 1546  Location: Buttocks  Location Orientation: Right;Left;Posterior  Staging: Stage 2 -  Partial thickness loss of dermis presenting as a shallow open injury with a red, pink wound bed without slough.  Wound Description (Comments):   Present on Admission: Yes     Estimated body mass index is 30.78 kg/m as calculated from the following:   Height as of this encounter: _0  (1.778 m).   Weight as of this encounter: 97.3 kg.  Malnutrition Type:   Malnutrition Characteristics:   Nutrition Interventions:    Radiology Studies: MR Shoulder Right W Wo Contrast  Result Date: 02/07/2021 CLINICAL DATA:   Right shoulder pain and stiffness. History of MSSA bacteremia, septic pulmonary emboli, and endocarditis. EXAM: MRI OF THE RIGHT SHOULDER WITHOUT AND WITH CONTRAST TECHNIQUE: Multiplanar, multisequence MR imaging of the right shoulder was performed before and after the administration of intravenous contrast. CONTRAST:  25m GADAVIST GADOBUTROL 1 MMOL/ML IV SOLN COMPARISON:  Right shoulder x-rays dated February 01, 2021. FINDINGS: Rotator cuff: Full-thickness, essentially full width tear of the supraspinatus tendon with 2.1 cm retraction to the top of the humeral head. High-grade partial-thickness articular surface tear of the mid to distal infraspinatus tendon. The teres minor and subscapularis tendons are intact. Muscles:  Mild infraspinatus muscle edema.  No muscle atrophy. Biceps long head: Partially torn within the bicipital groove. Normally positioned. Moderate tendinosis. Acromioclavicular Joint: Moderate arthropathy of the acromioclavicular joint. Type II acromion. Moderate amount of fluid in the subacromial/subdeltoid bursa extending into the subcoracoid bursa. Small amount of fluid superior to the acromioclavicular joint communicating with the subacromial/subdeltoid bursa through the joint space. Glenohumeral Joint: Trace joint effusion. Diffuse cartilage thinning without focal defect. Labrum: Grossly intact, but evaluation is limited by lack of intraarticular fluid. Bones: No acute fracture or dislocation. No suspicious bone lesion. Other: None. IMPRESSION: 1. No convincing evidence of septic arthritis. Moderate amount of fluid in the subacromial/subdeltoid bursa and subcoracoid bursa is favored related to underlying rotator cuff pathology described below rather than septic bursitis, although fluid sampling could be obtained for definitive diagnosis. 2. Full-thickness, essentially full width tear of the supraspinatus tendon with 2.1 cm retraction to the top of the humeral head. No muscle atrophy. 3. High-grade  partial-thickness articular surface tear of the mid to distal infraspinatus tendon. 4. Partially torn long head biceps tendon within the bicipital groove. 5. Moderate acromioclavicular and mild glenohumeral osteoarthritis. Electronically Signed   By: WTitus DubinM.D.   On: 02/07/2021 08:55   MR Shoulder Left W Wo Contrast  Result Date: 02/07/2021 CLINICAL DATA:  Left shoulder pain and stiffness. History of MSSA bacteremia, septic pulmonary emboli, and endocarditis. EXAM: MRI OF THE LEFT SHOULDER WITHOUT AND WITH CONTRAST TECHNIQUE: Multiplanar, multisequence MR imaging of the left shoulder was performed before and after the administration of intravenous contrast. CONTRAST:  154mGADAVIST GADOBUTROL 1 MMOL/ML IV SOLN, 1062mADAVIST GADOBUTROL 1 MMOL/ML IV SOLN COMPARISON:  Left shoulder x-rays dated February 01, 2021. FINDINGS: Rotator cuff: Full-thickness, partial width tear involving the anterior supraspinatus tendon with 2.2 cm retraction to the top of the humeral head. Additional high-grade partial-thickness articular surface tear of the posterior supraspinatus tendon. High-grade partial-thickness articular surface tear of the superior subscapularis tendon. The infraspinatus and teres minor tendons are intact. Muscles:  Mild reactive rotator cuff muscle edema.  No atrophy. Biceps long head:  Completely torn within the bicipital groove. Acromioclavicular Joint: Moderate arthropathy of the acromioclavicular joint. Small joint effusion with thick synovial enhancement (series 12, image 5). Type II acromion. Large amount of fluid within the subacromial/subdeltoid bursa and subcoracoid bursa with synovial enhancement. Fluid extends proximally along the supraspinatus, infraspinatus, and subscapularis muscles. Glenohumeral Joint: Small joint effusion with thick synovial enhancement. Diffuse cartilage thinning without focal defect. Labrum:  Intact. Bones: Mild marrow edema and enhancement in the greater tuberosity with  relatively preserved T1 marrow signal, likely reactive to rotator cuff pathology. No acute fracture or dislocation. No suspicious bone lesion. Other: Large rim enhancing fluid collection in the left axilla, incompletely visualized (series 12, image 12). IMPRESSION: 1. Findings concerning for glenohumeral and acromioclavicular septic arthritis with septic subacromial/subdeltoid and subcoracoid bursitis. No osteomyelitis. 2. Large abscess in the left axilla, incompletely visualized. 3. Full-thickness, partial width tear of the anterior supraspinatus tendon with 2.2 cm retraction to the top of the humeral head. Additional high-grade partial-thickness articular surface tear of the posterior supraspinatus tendon. 4. High-grade partial-thickness articular surface tear of the superior subscapularis tendon. 5. Complete tear of the biceps long head tendon within the bicipital groove. Electronically Signed   By: Titus Dubin M.D.   On: 02/07/2021 08:42    Scheduled Meds:  Chlorhexidine Gluconate Cloth  6 each Topical Daily   enoxaparin (LOVENOX) injection  40 mg Subcutaneous Q24H   gabapentin  800 mg Oral TID   insulin aspart  0-15 Units Subcutaneous TID WC   insulin aspart  0-5 Units Subcutaneous QHS   insulin glargine  15 Units Subcutaneous Daily   mouth rinse  15 mL Mouth Rinse BID   oxyCODONE  20 mg Oral Q12H   polyethylene glycol  17 g Oral Daily   senna-docusate  1 tablet Oral BID   Continuous Infusions:  sodium chloride Stopped (02/07/21 0508)    ceFAZolin (ANCEF) IV Stopped (02/07/21 1526)    LOS: 7 days   Kerney Elbe, DO [facility] PAGER is on AMION  If 7PM-7AM, please contact night-coverage [email or web address]

## 2021-02-07 NOTE — Telephone Encounter (Signed)
Pt said we had been trying to reach him, but he is in ICU.

## 2021-02-07 NOTE — TOC Progression Note (Signed)
Transition of Care [facility]) - Progression Note    Patient Details  Name: John Ware MRN: [id number] Date of Birth: [birth year]  [facility] CM/SW Contact  Golda Acre, RN Phone Number: 02/07/2021, 7:59 AM  Clinical Narrative:    Passar number is [phone] [phone] Fl2 note completed but not sent out due to continued fevers and condition. Na-125, WBC-20.2, hgb-10.8,  Insurance has denied CIR  cir has contacted familyu to see if appeal needs to be done.    Expected Discharge Plan: Home/Self Care Barriers to Discharge: Continued Medical Work up  Expected Discharge Plan and Services Expected Discharge Plan: Home/Self Care   Discharge Planning Services: CM Consult   Living arrangements for the past 2 months: Single Family Home                                       Social Determinants of Health (SDOH) Interventions    Readmission Risk Interventions No flowsheet data found.

## 2021-02-07 NOTE — Discharge Instructions (Addendum)
Diabetes Your A1c diabetes level was high at 10.2 % during this hospital visit. Which may require insulin injections at home. Start checking your glucose 2 times a day (first thing in the morning and alternating a second check either before lunch or before supper or at bedtime) to spot check how high your glucose is later in the day. You will need to stay in close contact with your PCP. In the future if you do telephone visits with your PCP I suggest you still need to go in for lab checks with your A1c level.  Continue daily bulky dressing changes to bilateral shoulder with gauze, abdominal pads, and tape.   Nutrition Post Hospital Stay Proper nutrition can help your body recover from illness and injury.   Foods and beverages high in protein, vitamins, and minerals help rebuild muscle loss, promote healing, & reduce fall risk.   In addition to eating healthy foods, a nutrition shake is an easy, delicious way to get the nutrition you need during and after your hospital stay  It is recommended that you continue to drink 2 bottles per day of:       Ensure Plus or Glucerna High Protein for at least 1 month (30 days) after your hospital stay   Tips for adding a nutrition shake into your routine: As allowed, drink one with vitamins or medications instead of water or juice Enjoy one as a tasty mid-morning or afternoon snack Drink cold or make a milkshake out of it Drink one instead of milk with cereal or snacks Use as a coffee creamer   Available at the following grocery stores and pharmacies:           * Karin Golden * [employer] * [employer]co  * Rite Aid          * Walmart * [employer]  * Walgreens      * Target  * BJ's   * CVS  * [employer]   * [facility] [phone]            For COUPONS visit: www.[email or web address]/join or [email or web address].br   Suggested Substitutions Ensure Plus = Boost Plus = Carnation Breakfast Essentials = Boost Compact Ensure Active Clear =  Boost Breeze Glucerna Shake = Boost Glucose Control = Carnation Breakfast Essentials SUGAR FREE

## 2021-02-07 NOTE — Consult Note (Addendum)
ORTHOPAEDIC CONSULTATION  REQUESTING PHYSICIAN: Merlene Laughter, DO  Chief Complaint: bilateral shoulder pain  HPI: John Ware is a 55 y.o. male with history of DM, HTN, spinal stenosis, chronic neck and back pain who complains of bilateral shoulder pain. He originally presented to the hospital with weakness, was found to have severe sepsis, bacteremia, endocarditis. His pain has been ongoing for over 10 years, he has struggled with range of motion, states "I have [employer] arms" because he struggles with any overhead shoulder motion. He also fell out of a chair on 12/2820 and hurt his left shoulder.  He presented to the ED, x-rays were negative, and told to follow up with orthopedics.  Today he states pain is worse with movement and better with rest. Locates pain diffusely at bilateral shoulders. Left worse than right. States pain, especially left shoulder pain is worse since being admitted than prior to admission.   Past Medical History:  Diagnosis Date   Depression    Diabetes mellitus without complication (HCC)    Hypertension    Spinal stenosis    With Neurogenic Claudication   Past Surgical History:  Procedure Laterality Date   CARPAL TUNNEL RELEASE Bilateral    IR US GUIDE BX ASP/DRAIN  02/02/2021   LUMBAR LAMINECTOMY/DECOMPRESSION MICRODISCECTOMY N/A 12/13/2014   Procedure: LUMBAR THREE-FOUR, LUMBAR FOUR-FIVE, LUMBAR FIVE-SACRAL ONE LAMINECTOMY;  Surgeon: [name], MD;  Location: [facility];  Service: Neurosurgery;  Laterality: N/A;  L3-4 L4-5 L5-S1 Laminectomy   MENISCUS REPAIR     Right knee   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   VASECTOMY     Social History   Socioeconomic History   Marital status: Married    Spouse name: Not on file   Number of children: Not on file   Years of education: Not on file   Highest education level: Not on file  Occupational History   Not on file  Tobacco Use   Smoking status: Former    Packs/day: 2.00     Years: 15.00    Pack years: 30.00    Types: Cigarettes    Quit date: 2012    Years since quitting: 10.4   Smokeless tobacco: Never  Vaping Use   Vaping Use: Never used  Substance and Sexual Activity   Alcohol use: Yes    Comment: occasional   Drug use: No   Sexual activity: Not on file  Other Topics Concern   Not on file  Social History Narrative   Not on file   Social Determinants of Health   Financial Resource Strain: Not on file  Food Insecurity: Not on file  Transportation Needs: Not on file  Physical Activity: Not on file  Stress: Not on file  Social Connections: Not on file   Family History  Problem Relation Age of Onset   Colon cancer Neg Hx    Allergies  Allergen Reactions   Tizanidine     Leg cramps     Positive ROS: All other systems have been reviewed and were otherwise negative with the exception of those mentioned in the HPI and as above.  Physical Exam: General: Alert, no acute distress Cardiovascular: No pedal edema Respiratory: no use of accessory musculature GI: abdomen is soft and non-tender Skin: No lesions noted at bilateral shoulder or axilla. Neurologic: Sensation intact distally Psychiatric: Patient is competent for consent with normal mood and affect Lymphatic: No axillary or cervical lymphadenopathy  MUSCULOSKELETAL: 0-45 degrees of  active forward flexion at bilateral shoulders with pain. No internal rotation. 5 degrees of external rotation with pain. + AC joint tenderness. No erythema at bilateral shoulders. No significant warmth to bilateral shoulders of axilla. Moderate edema at left axilla, none at right axilla. No areas of drainage.   Imaging: R shoulder MRI -  1. No convincing evidence of septic arthritis. Moderate amount of fluid in the subacromial/subdeltoid bursa and subcoracoid bursa is favored related to underlying rotator cuff pathology described below rather than septic bursitis, although fluid sampling could be obtained  for definitive diagnosis. 2. Full-thickness, essentially full width tear of the supraspinatus tendon with 2.1 cm retraction to the top of the humeral head. No muscle atrophy. 3. High-grade partial-thickness articular surface tear of the mid to distal infraspinatus tendon. 4. Partially torn long head biceps tendon within the bicipital groove.  L shoulder MRI- Findings concerning for glenohumeral and acromioclavicular septic arthritis with septic subacromial/subdeltoid and subcoracoid bursitis. No osteomyelitis. 2. Large abscess in the left axilla, incompletely visualized. 3. Full-thickness, partial width tear of the anterior supraspinatus tendon with 2.2 cm retraction to the top of the humeral head. Additional high-grade partial-thickness articular surface tear of the posterior supraspinatus tendon. 4. High-grade partial-thickness articular surface tear of the superior subscapularis tendon. 5. Complete tear of the biceps long head tendon within the bicipital groove.    Assessment: Bilateral shoulder rotator cuff tears, left shoulder glenohumeral and AC septic arthritis with axilla abscess   Plan: - plan for arthroscopic irrigation and debridement of left shoulder on Friday afternoon with Dr. Dion Saucier - IR has been consulted regarding axilla abscess to place drain  Armida Sans, PA-C  02/07/2021 3:37 PM

## 2021-02-07 NOTE — Progress Notes (Addendum)
Inpatient Diabetes Program Recommendations  AACE/ADA: New Consensus Statement on Inpatient Glycemic Control (2015)  Target Ranges:  Prepandial:   less than 140 mg/dL      Peak postprandial:   less than 180 mg/dL (1-2 hours)      Critically ill patients:  140 - 180 mg/dL   Lab Results  Component Value Date   GLUCAP 136 (H) 02/07/2021   HGBA1C 10.2 (H) 01/31/2021    Review of Glycemic Control  Diabetes history: DM 2 Outpatient Diabetes medications: unverified Current orders for Inpatient glycemic control:  Lantus 15 units Daily Novolog 0-15 units tid + hs  Inpatient Diabetes Program Recommendations:    Tried calling pt in room, no answer, and wife's cell phone no answer mail box full. Pt has had PCP appointments this year, however they have been virtual and an A1c has not been drawn in awhile. Per last visits notes on 5/24, pt does not check glucose at home but generally follows a diabetic diet.   Would recommend and will write in AVS for pt to start checking glucose 2 times a day (fasting and alternating second check) and report back to his PCP. I do see where the PCP tried to start pt on Jardiance in February of this year.  Pt to possibly benefit from insulin at time of d/c. Pt to d/c to possibly SNF.  Levemir preferred by insurance  Adendum 0940 am: Wife called back. Pt was newly placed on Jardiance back in February. He also takes metformin, wife is unsure how compliant he is on the medications. Discussed A1c level with her and plan for him to go home and monitor glucose trends. Also brought up the possible need for insulin at time of d/c. I will email ([email or web address]) wife on operational videos of insulin pen in addition to hypoglycemia to reference when pt eventually gets home.   Addendum 1054 am: Spoke with pt at bedside regarding A1c level and possible need for insulin at home. Showed pt the insulin pen, how to operate.  Pt very interested in CGM freestyle libre,  however SNF would probably not let pt use. Pt reports asking his PCP about it before and states he was "ignored". Spoke to pt about the importance of glucose control and monitoring at home. Pt reports that he sometimes did not take his medications as prescribed.   Thanks,  Christena Deem RN, MSN, BC-ADM Inpatient Diabetes Coordinator Team Pager [phone] (8a-5p)

## 2021-02-08 ENCOUNTER — Encounter (HOSPITAL_COMMUNITY): Payer: Self-pay | Admitting: Internal Medicine

## 2021-02-08 ENCOUNTER — Inpatient Hospital Stay (HOSPITAL_COMMUNITY): Payer: Commercial Managed Care - PPO

## 2021-02-08 ENCOUNTER — Inpatient Hospital Stay (HOSPITAL_COMMUNITY): Payer: Commercial Managed Care - PPO | Admitting: Certified Registered Nurse Anesthetist

## 2021-02-08 ENCOUNTER — Encounter (HOSPITAL_COMMUNITY)
Admission: EM | Disposition: A | Discharge status: Home Health | Payer: Self-pay | Source: Home / Self Care | Attending: Internal Medicine

## 2021-02-08 DIAGNOSIS — R7881 Bacteremia: Secondary | ICD-10-CM

## 2021-02-08 DIAGNOSIS — F419 Anxiety disorder, unspecified: Secondary | ICD-10-CM

## 2021-02-08 DIAGNOSIS — B9561 Methicillin susceptible Staphylococcus aureus infection as the cause of diseases classified elsewhere: Secondary | ICD-10-CM

## 2021-02-08 DIAGNOSIS — H539 Unspecified visual disturbance: Secondary | ICD-10-CM

## 2021-02-08 DIAGNOSIS — I330 Acute and subacute infective endocarditis: Secondary | ICD-10-CM

## 2021-02-08 DIAGNOSIS — E1169 Type 2 diabetes mellitus with other specified complication: Secondary | ICD-10-CM

## 2021-02-08 DIAGNOSIS — M25512 Pain in left shoulder: Secondary | ICD-10-CM

## 2021-02-08 DIAGNOSIS — I77810 Thoracic aortic ectasia: Secondary | ICD-10-CM

## 2021-02-08 DIAGNOSIS — E871 Hypo-osmolality and hyponatremia: Secondary | ICD-10-CM

## 2021-02-08 HISTORY — PX: IRRIGATION AND DEBRIDEMENT ABSCESS: SHX5252

## 2021-02-08 LAB — COMPREHENSIVE METABOLIC PANEL
ALT: 29 U/L (ref 0–44)
AST: 61 U/L — ABNORMAL HIGH (ref 15–41)
Albumin: 1.8 g/dL — ABNORMAL LOW (ref 3.5–5.0)
Alkaline Phosphatase: 98 U/L (ref 38–126)
Anion gap: 7 (ref 5–15)
BUN: 7 mg/dL (ref 6–20)
CO2: 29 mmol/L (ref 22–32)
Calcium: 7.9 mg/dL — ABNORMAL LOW (ref 8.9–10.3)
Chloride: 92 mmol/L — ABNORMAL LOW (ref 98–111)
Creatinine, Ser: 0.44 mg/dL — ABNORMAL LOW (ref 0.61–1.24)
GFR, Estimated: >60 mL/min (ref >60)
Glucose, Bld: 142 mg/dL — ABNORMAL HIGH (ref 70–99)
Potassium: 4.2 mmol/L (ref 3.5–5.1)
Sodium: 128 mmol/L — ABNORMAL LOW (ref 135–145)
Total Bilirubin: 0.7 mg/dL (ref 0.3–1.2)
Total Protein: 5.3 g/dL — ABNORMAL LOW (ref 6.5–8.1)

## 2021-02-08 LAB — GLUCOSE, CAPILLARY
Glucose-Capillary: 146 mg/dL — ABNORMAL HIGH (ref 70–99)
Glucose-Capillary: 135 mg/dL — ABNORMAL HIGH (ref 70–99)
Glucose-Capillary: 87 mg/dL (ref 70–99)
Glucose-Capillary: 116 mg/dL — ABNORMAL HIGH (ref 70–99)
Glucose-Capillary: 164 mg/dL — ABNORMAL HIGH (ref 70–99)

## 2021-02-08 LAB — MAGNESIUM: Magnesium: 2 mg/dL (ref 1.7–2.4)

## 2021-02-08 LAB — CBC WITH DIFFERENTIAL/PLATELET
Abs Immature Granulocytes: 0.74 10*3/uL — ABNORMAL HIGH (ref 0.00–0.07)
Basophils Absolute: 0.1 10*3/uL (ref 0.0–0.1)
Basophils Relative: 0 %
Eosinophils Absolute: 0 10*3/uL (ref 0.0–0.5)
Eosinophils Relative: 0 %
HCT: 28.1 % — ABNORMAL LOW (ref 39.0–52.0)
Hemoglobin: 9.6 g/dL — ABNORMAL LOW (ref 13.0–17.0)
Immature Granulocytes: 4 %
Lymphocytes Relative: 8 %
Lymphs Abs: 1.6 10*3/uL (ref 0.7–4.0)
MCH: 30.6 pg (ref 26.0–34.0)
MCHC: 34.2 g/dL (ref 30.0–36.0)
MCV: 89.5 fL (ref 80.0–100.0)
Monocytes Absolute: 0.6 10*3/uL (ref 0.1–1.0)
Monocytes Relative: 3 %
Neutro Abs: 15.8 10*3/uL — ABNORMAL HIGH (ref 1.7–7.7)
Neutrophils Relative %: 85 %
Platelets: 444 10*3/uL — ABNORMAL HIGH (ref 150–400)
RBC: 3.14 MIL/uL — ABNORMAL LOW (ref 4.22–5.81)
RDW: 15 % (ref 11.5–15.5)
WBC: 18.8 10*3/uL — ABNORMAL HIGH (ref 4.0–10.5)
nRBC: 0 % (ref 0.0–0.2)

## 2021-02-08 LAB — PHOSPHORUS: Phosphorus: 4.5 mg/dL (ref 2.5–4.6)

## 2021-02-08 SURGERY — MINOR INCISION AND DRAINAGE OF ABSCESS
Anesthesia: General | Site: Shoulder | Laterality: Left

## 2021-02-08 MED ORDER — CHLORHEXIDINE GLUCONATE 4 % EX LIQD: 60.0000 mL | Freq: Once | CUTANEOUS | Status: DC

## 2021-02-08 MED ORDER — FENTANYL CITRATE (PF) 250 MCG/5ML IJ SOLN
INTRAMUSCULAR | Status: AC
  Filled 2021-02-08: qty 5

## 2021-02-08 MED ORDER — INSULIN ASPART 100 UNIT/ML IJ SOLN
3.0000 [IU] | Freq: Three times a day (TID) | INTRAMUSCULAR | Status: DC
  Administered 2021-02-09 – 2021-02-13 (×10): 3 [IU] via SUBCUTANEOUS

## 2021-02-08 MED ORDER — SUGAMMADEX SODIUM 200 MG/2ML IV SOLN
INTRAVENOUS | Status: DC | PRN
  Administered 2021-02-08: 200 mg via INTRAVENOUS

## 2021-02-08 MED ORDER — ACETAMINOPHEN 500 MG PO TABS
1000.0000 mg | ORAL_TABLET | Freq: Once | ORAL | Status: AC
  Administered 2021-02-08: 1000 mg via ORAL
  Filled 2021-02-08: qty 2

## 2021-02-08 MED ORDER — EPHEDRINE SULFATE 50 MG/ML IJ SOLN
INTRAMUSCULAR | Status: DC | PRN
  Administered 2021-02-08: 5 mg via INTRAVENOUS
  Administered 2021-02-08: 10 mg via INTRAVENOUS
  Administered 2021-02-08: 5 mg via INTRAVENOUS

## 2021-02-08 MED ORDER — PROPOFOL 10 MG/ML IV BOLUS
INTRAVENOUS | Status: AC
  Filled 2021-02-08: qty 20

## 2021-02-08 MED ORDER — LACTATED RINGERS IV SOLN: Freq: Once | INTRAVENOUS | Status: AC

## 2021-02-08 MED ORDER — POVIDONE-IODINE 10 % EX SWAB
2.0000 "application " | Freq: Once | CUTANEOUS | Status: AC
  Administered 2021-02-08: 2 via TOPICAL

## 2021-02-08 MED ORDER — BUPIVACAINE HCL (PF) 0.25 % IJ SOLN
INTRAMUSCULAR | Status: DC | PRN
  Administered 2021-02-08: 20 mL

## 2021-02-08 MED ORDER — FENTANYL CITRATE (PF) 100 MCG/2ML IJ SOLN
INTRAMUSCULAR | Status: DC | PRN
  Administered 2021-02-08: 50 ug via INTRAVENOUS

## 2021-02-08 MED ORDER — MIDAZOLAM HCL 5 MG/5ML IJ SOLN
INTRAMUSCULAR | Status: DC | PRN
  Administered 2021-02-08: 2 mg via INTRAVENOUS

## 2021-02-08 MED ORDER — HYDROMORPHONE HCL 1 MG/ML IJ SOLN
0.2500 mg | INTRAMUSCULAR | Status: DC | PRN
  Administered 2021-02-08 (×2): 0.5 mg via INTRAVENOUS

## 2021-02-08 MED ORDER — LACTATED RINGERS IV SOLN: INTRAVENOUS | Status: DC | PRN

## 2021-02-08 MED ORDER — ROCURONIUM BROMIDE 10 MG/ML (PF) SYRINGE
PREFILLED_SYRINGE | INTRAVENOUS | Status: DC | PRN
  Administered 2021-02-08: 10 mg via INTRAVENOUS
  Administered 2021-02-08: 40 mg via INTRAVENOUS
  Administered 2021-02-08: 10 mg via INTRAVENOUS

## 2021-02-08 MED ORDER — PHENYLEPHRINE 40 MCG/ML (10ML) SYRINGE FOR IV PUSH (FOR BLOOD PRESSURE SUPPORT)
PREFILLED_SYRINGE | INTRAVENOUS | Status: DC | PRN
  Administered 2021-02-08: 80 ug via INTRAVENOUS
  Administered 2021-02-08 (×2): 120 ug via INTRAVENOUS
  Administered 2021-02-08: 80 ug via INTRAVENOUS

## 2021-02-08 MED ORDER — DEXAMETHASONE SODIUM PHOSPHATE 10 MG/ML IJ SOLN
INTRAMUSCULAR | Status: DC | PRN
  Administered 2021-02-08: 10 mg via INTRAVENOUS

## 2021-02-08 MED ORDER — LIDOCAINE 2% (20 MG/ML) 5 ML SYRINGE
INTRAMUSCULAR | Status: DC | PRN
  Administered 2021-02-08: 60 mg via INTRAVENOUS

## 2021-02-08 MED ORDER — BUPIVACAINE HCL (PF) 0.25 % IJ SOLN
INTRAMUSCULAR | Status: AC
  Filled 2021-02-08: qty 30

## 2021-02-08 MED ORDER — CEFAZOLIN SODIUM-DEXTROSE 2-4 GM/100ML-% IV SOLN
INTRAVENOUS | Status: AC
  Filled 2021-02-08: qty 100

## 2021-02-08 MED ORDER — METOCLOPRAMIDE HCL 5 MG/ML IJ SOLN
5.0000 mg | Freq: Three times a day (TID) | INTRAMUSCULAR | Status: DC | PRN
Start: 2021-02-08 — End: 2021-03-15

## 2021-02-08 MED ORDER — HYDROMORPHONE HCL 1 MG/ML IJ SOLN
INTRAMUSCULAR | Status: AC
  Administered 2021-02-10: 0.5 mg via INTRAVENOUS
  Filled 2021-02-08: qty 1

## 2021-02-08 MED ORDER — SODIUM CHLORIDE 0.9 % IR SOLN
Status: DC | PRN
  Administered 2021-02-08 (×2): 3000 mL

## 2021-02-08 MED ORDER — MIDAZOLAM HCL 2 MG/2ML IJ SOLN
INTRAMUSCULAR | Status: AC
  Filled 2021-02-08: qty 2

## 2021-02-08 MED ORDER — CHLORHEXIDINE GLUCONATE 0.12 % MT SOLN
15.0000 mL | OROMUCOSAL | Status: AC
  Administered 2021-02-08: 15 mL via OROMUCOSAL

## 2021-02-08 MED ORDER — METOCLOPRAMIDE HCL 5 MG PO TABS: 5.0000 mg | ORAL_TABLET | Freq: Three times a day (TID) | ORAL | Status: DC | PRN

## 2021-02-08 MED ORDER — PHENYLEPHRINE HCL-NACL 10-0.9 MG/250ML-% IV SOLN
INTRAVENOUS | Status: DC | PRN
  Administered 2021-02-08: 40 ug/min via INTRAVENOUS

## 2021-02-08 MED ORDER — PHENYLEPHRINE HCL (PRESSORS) 10 MG/ML IV SOLN
INTRAVENOUS | Status: AC
  Filled 2021-02-08: qty 1

## 2021-02-08 MED ORDER — ONDANSETRON HCL 4 MG/2ML IJ SOLN
INTRAMUSCULAR | Status: DC | PRN
  Administered 2021-02-08: 4 mg via INTRAVENOUS

## 2021-02-08 MED ORDER — ENOXAPARIN SODIUM 40 MG/0.4ML IJ SOSY
40.0000 mg | PREFILLED_SYRINGE | INTRAMUSCULAR | Status: DC
  Administered 2021-02-09: 40 mg via SUBCUTANEOUS
  Filled 2021-02-08: qty 0.4

## 2021-02-08 SURGICAL SUPPLY — 49 items
BNDG CMPR 9X4 STRL LF SNTH (GAUZE/BANDAGES/DRESSINGS)
BNDG COHESIVE 4X5 TAN STRL (GAUZE/BANDAGES/DRESSINGS) ×2 IMPLANT
BNDG ELASTIC 4X5.8 VLCR STR LF (GAUZE/BANDAGES/DRESSINGS) ×1 IMPLANT
BNDG ESMARK 4X9 LF (GAUZE/BANDAGES/DRESSINGS) IMPLANT
BOOTIES KNEE HIGH SLOAN (MISCELLANEOUS) ×4 IMPLANT
CNTNR URN SCR LID CUP LEK RST (MISCELLANEOUS) IMPLANT
CONT SPEC 4OZ STRL OR WHT (MISCELLANEOUS) ×2
COVER SURGICAL LIGHT HANDLE (MISCELLANEOUS) ×2 IMPLANT
COVER WAND RF STERILE (DRAPES) IMPLANT
DRAPE POUCH INSTRU U-SHP 10X18 (DRAPES) ×1 IMPLANT
DRAPE SURG 17X11 SM STRL (DRAPES) ×1 IMPLANT
DRAPE U-SHAPE 47X51 STRL (DRAPES) ×1 IMPLANT
DRSG PAD ABDOMINAL 8X10 ST (GAUZE/BANDAGES/DRESSINGS) ×4 IMPLANT
DURAPREP 26ML APPLICATOR (WOUND CARE) ×4 IMPLANT
ELECT REM PT RETURN 15FT ADLT (MISCELLANEOUS) ×2 IMPLANT
FACESHIELD WRAPAROUND (MASK) ×4 IMPLANT
FACESHIELD WRAPAROUND OR TEAM (MASK) IMPLANT
GAUZE SPONGE 4X4 12PLY STRL (GAUZE/BANDAGES/DRESSINGS) ×2 IMPLANT
GAUZE XEROFORM 1X8 LF (GAUZE/BANDAGES/DRESSINGS) ×1 IMPLANT
GAUZE XEROFORM 5X9 LF (GAUZE/BANDAGES/DRESSINGS) ×1 IMPLANT
GLOVE SRG 8 PF TXTR STRL LF DI (GLOVE) ×1 IMPLANT
GLOVE SURG ENC MOIS LTX SZ7 (GLOVE) ×2 IMPLANT
GLOVE SURG POLYISO LF SZ7.5 (GLOVE) ×2 IMPLANT
GLOVE SURG UNDER POLY LF SZ7 (GLOVE) ×2 IMPLANT
GLOVE SURG UNDER POLY LF SZ8 (GLOVE) ×2
GOWN STRL REUS W/TWL LRG LVL3 (GOWN DISPOSABLE) ×4 IMPLANT
HANDPIECE INTERPULSE COAX TIP (DISPOSABLE) ×2
IV NS IRRIG 3000ML ARTHROMATIC (IV SOLUTION) ×5 IMPLANT
KIT BASIN OR (CUSTOM PROCEDURE TRAY) ×2 IMPLANT
KIT TURNOVER KIT A (KITS) ×2 IMPLANT
MANIFOLD NEPTUNE II (INSTRUMENTS) ×2 IMPLANT
NS IRRIG 1000ML POUR BTL (IV SOLUTION) ×2 IMPLANT
PACK ORTHO EXTREMITY (CUSTOM PROCEDURE TRAY) ×2 IMPLANT
PAD CAST 4YDX4 CTTN HI CHSV (CAST SUPPLIES) ×2 IMPLANT
PADDING CAST COTTON 4X4 STRL (CAST SUPPLIES)
PENCIL SMOKE EVACUATOR (MISCELLANEOUS) IMPLANT
PROTECTOR NERVE ULNAR (MISCELLANEOUS) ×2 IMPLANT
SET HNDPC FAN SPRY TIP SCT (DISPOSABLE) IMPLANT
SET IRRIG Y TYPE TUR BLADDER L (SET/KITS/TRAYS/PACK) IMPLANT
SLEEVE SCD COMPRESS KNEE MED (STOCKING) ×1 IMPLANT
SUPPORT WRAP ARM LG (MISCELLANEOUS) ×1 IMPLANT
SUT ETHILON 2 0 PS N (SUTURE) ×2 IMPLANT
SUT NYLON 3 0 (SUTURE) ×4 IMPLANT
SUT VIC AB 2-0 CT1 27 (SUTURE) ×2
SUT VIC AB 2-0 CT1 TAPERPNT 27 (SUTURE) ×1 IMPLANT
SWAB COLLECTION DEVICE MRSA (MISCELLANEOUS) IMPLANT
SWAB CULTURE ESWAB REG 1ML (MISCELLANEOUS) IMPLANT
TAPE CLOTH SURG 6X10 WHT LF (GAUZE/BANDAGES/DRESSINGS) ×1 IMPLANT
TOWEL OR 17X26 10 PK STRL BLUE (TOWEL DISPOSABLE) ×2 IMPLANT

## 2021-02-08 NOTE — Anesthesia Preprocedure Evaluation (Addendum)
Anesthesia Evaluation  Patient identified by MRN, date of birth, ID band Patient awake    Reviewed: Allergy & Precautions, H&P , NPO status , Patient's Chart, lab work & pertinent test results  Airway Mallampati: III  TM Distance: >3 FB Neck ROM: Full    Dental no notable dental hx. (+) Teeth Intact, Dental Advisory Given   Pulmonary neg pulmonary ROS, former smoker,    Pulmonary exam normal breath sounds clear to auscultation       Cardiovascular hypertension, Pt. on medications  Rhythm:Regular Rate:Normal     Neuro/Psych Anxiety Depression negative neurological ROS     GI/Hepatic negative GI ROS, Neg liver ROS,   Endo/Other  diabetes, Type 2, Oral Hypoglycemic Agents  Renal/GU negative Renal ROS  negative genitourinary   Musculoskeletal  (+) Arthritis , Osteoarthritis,    Abdominal   Peds  Hematology negative hematology ROS (+)   Anesthesia Other Findings   Reproductive/Obstetrics negative OB ROS                            Anesthesia Physical Anesthesia Plan  ASA: 3  Anesthesia Plan: General   Post-op Pain Management:    Induction: Intravenous  PONV Risk Score and Plan: 3 and Ondansetron, Dexamethasone and Midazolam  Airway Management Planned: Oral ETT  Additional Equipment:   Intra-op Plan:   Post-operative Plan: Extubation in OR  Informed Consent: I have reviewed the patients History and Physical, chart, labs and discussed the procedure including the risks, benefits and alternatives for the proposed anesthesia with the patient or authorized representative who has indicated his/her understanding and acceptance.     Dental advisory given  Plan Discussed with: CRNA  Anesthesia Plan Comments:        Anesthesia Quick Evaluation

## 2021-02-08 NOTE — Progress Notes (Signed)
Inpatient Diabetes Program Recommendations  AACE/ADA: New Consensus Statement on Inpatient Glycemic Control (2015)  Target Ranges:  Prepandial:   less than 140 mg/dL      Peak postprandial:   less than 180 mg/dL (1-2 hours)      Critically ill patients:  140 - 180 mg/dL   Lab Results  Component Value Date   GLUCAP 146 (H) 02/08/2021   HGBA1C 10.2 (H) 01/31/2021    Review of Glycemic Control Results for TYLLER, BOWLBY (MRN [id number]) as of 02/08/2021 09:12  Ref. Range 02/07/2021 07:20 02/07/2021 11:53 02/07/2021 16:17 02/07/2021 21:53 02/08/2021 07:47  Glucose-Capillary Latest Ref Range: 70 - 99 mg/dL 245 (H) 809 (H) 983 (H) 236 (H) 146 (H)   Diabetes history: DM 2 Outpatient Diabetes medications: unverified Current orders for Inpatient glycemic control:  Lantus 15 units Daily Novolog 0-15 units tid + hs  Inpatient Diabetes Program Recommendations:    - Add Novolog 3 units tid meal coverage if eating >50% of meals    Pt to possibly benefit from insulin at time of d/c. Pt to d/c to possibly SNF.  Levemir preferred by insurance  Spoke with pt and wife yesterday.  Thanks,  Christena Deem RN, MSN, BC-ADM Inpatient Diabetes Coordinator Team Pager [phone] (8a-5p)

## 2021-02-08 NOTE — Anesthesia Postprocedure Evaluation (Signed)
Anesthesia Post Note  Patient: John Ware  Procedure(s) Performed: MINOR INCISION AND DRAINAGE OF ABSCESS (Left: Shoulder)     Patient location during evaluation: PACU Anesthesia Type: General Level of consciousness: awake and alert Pain management: pain level controlled Vital Signs Assessment: post-procedure vital signs reviewed and stable Respiratory status: spontaneous breathing, nonlabored ventilation, respiratory function stable and patient connected to nasal cannula oxygen Cardiovascular status: blood pressure returned to baseline and stable Postop Assessment: no apparent nausea or vomiting Anesthetic complications: no   No notable events documented.  Last Vitals:  Vitals:   02/08/21 2043 02/08/21 2100  BP: 113/81   Pulse: 83   Resp: 17   Temp:  36.4 C  SpO2: 95%     Last Pain:  Vitals:   02/08/21 2100  TempSrc: Oral  PainSc:                  Darrold Bezek,W. EDMOND

## 2021-02-08 NOTE — Anesthesia Procedure Notes (Addendum)
Procedure Name: Intubation Date/Time: 02/08/2021 6:04 PM Performed by: Gerald Leitz, CRNA Pre-anesthesia Checklist: Patient identified, Patient being monitored, Timeout performed, Emergency Drugs available and Suction available Patient Re-evaluated:Patient Re-evaluated prior to induction Oxygen Delivery Method: Circle system utilized Preoxygenation: Pre-oxygenation with 100% oxygen Induction Type: IV induction Ventilation: Mask ventilation without difficulty Laryngoscope Size: Mac and 3 Grade View: Grade I Tube type: Oral Tube size: 8.0 mm Number of attempts: 1 Placement Confirmation: ETT inserted through vocal cords under direct vision, positive ETCO2 and breath sounds checked- equal and bilateral Secured at: 21 cm Tube secured with: Tape Dental Injury: Teeth and Oropharynx as per pre-operative assessment

## 2021-02-08 NOTE — Op Note (Signed)
01/31/2021 - 02/08/2021  7:19 PM  PATIENT:  John Ware    PRE-OPERATIVE DIAGNOSIS: Left shoulder abscess, deep, involving the joint, subacromial space, as well as the subscapular region, and upper thorax  POST-OPERATIVE DIAGNOSIS:  Same  PROCEDURE: Left shoulder excisional debridement, deep abscess, with arthrotomy and irrigation and debridement of the joint with biceps tenotomy and excision of necrotic rotator cuff  SURGEON:  Eulas Post, MD  PHYSICIAN ASSISTANT: Janine Ores, PA-C, present and scrubbed throughout the case, critical for completion in a timely fashion, and for retraction, instrumentation, and closure.  ANESTHESIA:   General  PREOPERATIVE INDICATIONS:  John Ware is a  55 y.o. male who presented with methicillin sensitive staph aureus bacteremia and sepsis with multiloculated lesions in his lungs and thorax and left shoulder.  He elected for surgical debridement.  The risks benefits and alternatives were discussed with the patient preoperatively including but not limited to the risks of infection, bleeding, nerve injury, cardiopulmonary complications, the need for revision surgery, among others, and the patient was willing to proceed.  We also discussed the risks for progression of infection, inability to clear the infection, progression into the bone, the need for multiple future operations, among others.  ESTIMATED BLOOD LOSS: 100 mL  OPERATIVE IMPLANTS: None  OPERATIVE FINDINGS: He had gross purulence tracking both anterior and posterior to the subscapularis, deep to the scapula anterior to the scapular body, into the joint, with a necrotic rotator cuff superiorly, and a necrotic biceps tendon.  OPERATIVE PROCEDURE: The patient was brought to the operating room and placed in supine position.  General anesthesia was administered.  He is on a regimen of Ancef for methicillin sensitive staph aureus.  He was placed in a beachchair position.  The left upper  extremity was prepped and draped in usual sterile fashion.  Timeout was performed.  Deltopectoral approach was carried out with the above named findings.  I placed a deep Covel retractor, and expressed substantial purulence from behind the conjoined tendon, and I also excised the superior rotator cuff.  I left the subscapularis intact and it was still attached.  I accessed the joint through the rotator interval and the supraspinatus defect, internally and externally rotated the arm, explored anterior to the scapular body over the front side of the glenoid, and decompress this area with the suction as well as my blunt dissection.  I explored superiorly and posteriorly as well, I did not get as much purulence from this location.  He had a fair amount of fullness in the chest wall region below the axilla, and may need further surgical intervention for that, I would likely defer to a general surgery consult with this region, as this is not a region in which I operate.  I irrigated the wounds copiously with 6 L of pulse lavage, and I contemplated placing a wound VAC, but his deltopectoral tissue was not infected, and that closed automatically so wound VAC at least in that location I did not think was going to be that beneficial.  The skin was closed with nylon, and the patient was dressed with sterile gauze and was awakened and returned to the PACU in stable and satisfactory condition.  We will plan for a repeat CAT scan of his shoulder as well as his chest in order to determine whether further surgical intervention is indicated on his axilla, and possibly on other portions of his upper extremity with interventional radiology.  Debridement type: Excisional Debridement  Side: left  Body Location: Subacromial space, shoulder joint, subdeltoid region  Tools used for debridement: scalpel, scissors, curette, and rongeur  Pre-debridement Wound size (cm):   Length: 0        width: 0     depth:  0  Post-debridement Wound size (cm):   Length: 0        width: 0     depth: 0  Debridement depth beyond dead/damaged tissue down to healthy viable tissue: yes  Tissue layer involved: skin, subcutaneous tissue, muscle / fascia  Nature of tissue removed: Necrotic, Devitalized Tissue, and Purulence  Irrigation volume: 6 L     Irrigation fluid type: Normal Saline

## 2021-02-08 NOTE — Progress Notes (Signed)
IP rehab admissions - Attending MD was given information about peer to peer with insurance carrier.  MD says patient is not medically ready for CIR at this time.  Call for questions.  [phone]

## 2021-02-08 NOTE — Progress Notes (Signed)
PROGRESS NOTE    John Ware  LFY:[id number] DOB: 08/26/1966 DOA: 01/31/2021 PCP: Sharion Balloon, FNP   Brief Narrative:  Patient is a 55 year old obese Caucasian male with past medical history significant for but not limited to chronic pain, hypertension, diabetes mellitus type 2 as well as other comorbidities who presented with weakness.  History is obtained from the wife as he is a poor historian but she reported that he had been generally unwell for the past 3 weeks or so.  He had at home positive COVID test on 01/12/2021.  He had no respiratory symptoms at this time but he did have fevers that was responsive to Tylenol.  Reportedly he apparently fell out of his chair and hurt his left shoulder and he was evaluated by the ED and they found no fracture and he was given a sling and told to follow-up with orthopedics.  Since that time the patient's wife reports that he has had general body aches and pain and has increased symptoms that are not responsive to his chronic pain meds.  He is also had poor appetite during this time and has been unable to walk for last few days.  Because of these constantly some symptoms he is brought to the ED for further evaluation.  In the ED is found to have a sodium of 113 and a glucose of 339 and a WBC of 20.5 as well as a lactic acid of 3.8.  He started on broad-spectrum antibiotics and given fluids.  Soon after his blood cultures became positive for 4-4 MSSA.  He was transitioned to IV Ancef.  With further work-up and confirmation he was also found to have a large fluid collection of left lateral posterior chest and back.  He did have tenderness in his back.  He underwent MRI T-spine and CT of the chest abdomen pelvis.  There is no evidence of any osteomyelitis or discitis or other underlying infection on MRI T-spine.  CT of the chest did show a large multiloculated fluid collection.  IR was consulted and he underwent an ultrasound-guided aspiration was sent for  culturing.  CT was concerning for evidence of septic emboli but 2D echo did not reveal any evidence of an endocarditis.  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  Subsequently he still complained of some shoulder pain so he underwent bilateral MRIs of his shoulders and he is still having fevers.  At the left shoulder was concerned for septic arthritis and left axillary abscess.  Orthopedics was reconsulted as well as IR and the orthopedic surgeon is planning to do a washout tomorrow in edition IR is going to place a drain on the residual fluid collection that may exist after washout.  Because of the surgery ID changed the patient to nafcillin however recommending continuing cefazolin at this time. ESR and CRP and both are elevated.  Patient to go for Orthopedic Intervention later this evening. Patient is not medically ready to be D/C'd to CIR.    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 This would be start date of abx likely. Final rec's per ID for length of duration of Ancef, likely 6 weeks; he would be a candidate for a PICC and if accepted to CIR they could continue abx there if approved but he is not medically stable yet    Bacteremia due to  Staphylococcus aureus - 4/4 blood cultures on admission growing MSSA -Repeated blood cultures on 02/01/2021; NGTD - TTE negative for vegetations; cardiology consulted for TEE, 02/05/21: POSITIVE for TV vegetation - now s/p IR drainage of left posterior chest wall fluid collection which was noted to be purulent in nature; culture also growing MSSA - MRI T spine 02/01/21 negative for OM/discitis  - MRI brain 6/12 negative for pathology in visual cortex to explain "floaters" in visual fields - continue ancef for now  - ID following and appreciate recc's    Hyponatremia - Initially presumed due to dehydration and hypokalemia - severely low on admission, 113 - stabilizing out in mid-upper 120s with rehydration. Today appears a bit fluid overloaded and is tolerating good po. Na now trending up and is 128 - will check tsh, repeat osm and urine osm, urine sodium. Cortisol wnl when checked earlier this admission, will repeat tomorrow with acth stim test. Reset osmostat possible - may need fluid restriction   Left shoulder pain - See work-up above - Was previously seen in the ER after his fall and recommended for a sling with outpatient Ortho follow-up -Bilateral shoulder x-rays unremarkable for now.   - MRI of shoulders to be performed today to eval for abscess/osteo; Left Shoulder MRI showed "Findings concerning for glenohumeral and acromioclavicular septic arthritis with septic subacromial/subdeltoid and  subcoracoid bursitis. No osteomyelitis. Large abscess in the left axilla, incompletely visualized. Full-thickness, partial width tear of the anterior supraspinatus tendon with 2.2 cm retraction to the top of  the humeral head. Additional high-grade partial-thickness articular surface tear of the posterior  supraspinatus tendon. High-grade partial-thickness articular surface tear of the superior subscapularis  tendon. complete tear of the biceps long head tendon within the bicipital groove." -Right should  MRI done showed "No convincing evidence of septic arthritis. Moderate amount of fluid in  the subacromial/subdeltoid bursa and subcoracoid bursa is favored related to underlying rotator cuff  pathology described below rather than septic bursitis, although fluid sampling could be obtained for  definitive diagnosis. Full-thickness, essentially full width tear of the supraspinatus tendon with 2.1 cm retraction to the top of the humeral head. No muscle atrophy. High-grade partial-thickness articular  surface tear of the mid to distal infraspinatus tendon. Partially torn long head biceps tendon within the  bicipital groove. Moderate acromioclavicular and mild glenohumeral osteoarthritis." -Orthopedic surgery evaluated and his left shoulder due to his complex constellation of infection and they feel he may need multiple operations.  The plan is for I&D and there may be more that needs to be drained even postop from his chest wall and will refer to IR based on his postop imaging.  Per Dr. Mardelle Matte he may be able to decompress the entirety of his shoulder with an open approach release give more thorough washout open this is planned for today with wound VAC  and IR has also been consulted to regarding axilla abscess to place drain.   Vision changes - Patient now stating on 02/04/2021 that he has been seeing "black floaters" in his field of vision for several days.  When questioned why he did not bring this up sooner, he did not have a good answer and did not want to "trouble [place]" - ophtho evaluated 6/13, no endophthalmitis   DKA (diabetic ketoacidosis) (HCC)-resolved as of 02/02/2021 - present on admission - responded to fluids and insulin - now resolved -Lantus 15 units subcu daily as well as moderate NovoLog sliding scale insulin before meals and at bedtime and will add 3 units of NovoLog 3 times daily if the patient eats greater than 50% of his meals   Ascending aorta dilatation (HCC) - TTE on 6/10 notes mild  dilation of ascending aorta, 38 mm - follow TEE results as well: normal thoracic and ascending aorta noted on 02/05/21   Anxiety - Wellbutrin and Zoloft on hold for now in setting of hyponatremia   HTN (hypertension) - Pressure normal on admission.  Will treat if needed - Continue to Monitor BP per Protocol -Last BP was 110/67    Diabetes mellitus (HCC) - Continue SSI and CBG monitoring -C/w Lantus 15 units subcu daily as well as moderate NovoLog sliding scale insulin before meals and at bedtime and will add 3 units of NovoLog 3 times daily if the patient eats greater than 50% of his meals - A1c 10.2% on 01/31/2021 -CBGs ranging from 741-287   Obesity -Complicates overall prognosis and care -Estimated body mass index is 30.78 kg/m as calculated from the following:   Height as of this encounter: '5\' 10"'  (1.778 m).   Weight as of this encounter: 97.3 kg. -Weight Loss and Dietary Counseling given   Right Knee Pain -Obtained X-Ray and showed "Medial joint space narrowing is noted with mild osteophytic change. Mild patellofemoral changes are noted as well. No joint effusion is seen. No acute fracture is noted."  Normocytic Anemia -Relatively Stable -Patient's Hgb/Hct went from 10.5/29.9 -> 10.8/31.0 -> 10.1/28.7 -> 9.6/28.1 -Check Anemia Panel in the AM  -Continue to Monitor for S/Sx of Bleeding  -Repeat CBC in the AM   Thrombocytosis -Likely Reactive -Patient's Platelet Count went from 397 -> 414 -> 427 -> 444 -Continue to Monitor and Trend -Repeat CBC in the AM  DVT prophylaxis: Enoxaparin 40 mg subcu every 24 Code Status: FULL CODE  Family Communication: Attempted to call the wife but she did not pick up her phone as it went straight to voicemail Disposition Plan: Pending further work-up and clearance by specialists; Will transfer to Medical Floor  Status is: Inpatient  Remains inpatient appropriate because:Unsafe d/c plan, IV treatments appropriate due to intensity of  illness or inability to take PO, and Inpatient level of care appropriate due to severity of illness  Dispo: The patient is from: Home              Anticipated d/c is to: CIR vs SNF              Patient currently is not medically stable to d/c.   Difficult to place patient No  Consultants:  Infectious Diseases Orthopedic Surgery Interventional Radiology  CIR Cardiology  Procedures:  TEE Results: Normal LV size and function Normal RV size and function Normal RA Normal LA and LA appendage Abnormal TV with large filamentous highly mobile density predominantly on the ventricular side of the TV leaflets consistent with  vegetation.  There is mild associated TR. Normal PV Normal MV with trivial MR Normal trileaflet AV with trivial AR Normal interatrial septum with no evidence of shunt by colorflow dopper Normal thoracic and ascending aorta.    Antimicrobials:  Anti-infectives (From admission, onward)    Start     Dose/Rate Route Frequency Ordered Stop   02/01/21 0600  ceFAZolin (ANCEF) IVPB 2g/100 mL premix        2 g 200 mL/hr over 30 Minutes Intravenous Every 8 hours 02/01/21 0414     01/31/21 1800  cefTRIAXone (ROCEPHIN) 1 g in sodium chloride 0.9 % 100 mL IVPB  Status:  Discontinued        1 g 200 mL/hr over 30 Minutes Intravenous Every 24 hours 01/31/21 1453 02/01/21 0425   01/31/21 1115  vancomycin (VANCOREADY) IVPB 2000 mg/400 mL        2,000 mg 200 mL/hr over 120 Minutes Intravenous  Once 01/31/21 1105 01/31/21 1320   01/31/21 1045  ceFEPIme (MAXIPIME) 2 g in sodium chloride 0.9 % 100 mL IVPB        2 g 200 mL/hr over 30 Minutes Intravenous  Once 01/31/21 1035 01/31/21 1129   01/31/21 1045  metroNIDAZOLE (FLAGYL) IVPB 500 mg        500 mg 100 mL/hr over 60 Minutes Intravenous  Once 01/31/21 1035 01/31/21 1208   01/31/21 1045  vancomycin (VANCOCIN) IVPB 1000 mg/200 mL premix  Status:  Discontinued        1,000 mg 200 mL/hr over 60 Minutes Intravenous  Once 01/31/21  1035 01/31/21 1105        Subjective: Seen and examined at bedside and he was complaining about shoulder pain and chest discomfort as well as right knee pain.  No lightheadedness or dizziness.  States that he had an okay night.  No other concerns or complaints at this time but is hungry.  Objective: Vitals:   02/08/21 0800 02/08/21 1000 02/08/21 1200 02/08/21 1300  BP: 111/78 122/79 110/67   Pulse: 87 86 78 80  Resp: '12  15 18  ' Temp: 97.9 F (36.6 C)  97.9 F (36.6 C)   TempSrc: Oral  Oral   SpO2: 94% 91% 91% 94%  Weight:      Height:        Intake/Output Summary (Last 24 hours) at 02/08/2021 1426 Last data filed at 02/08/2021 0900 Gross per 24 hour  Intake 500 ml  Output 3075 ml  Net -2575 ml    Filed Weights   01/31/21 1400 02/05/21 0500  Weight: 89.9 kg 97.3 kg   Examination: Physical Exam:  Constitutional: WN/WD obese Caucasian male currently complaining about some pain in and does appear uncomfortable  Eyes: Lids and conjunctivae normal, sclerae anicteric  ENMT: External Ears, Nose appear normal. Grossly normal hearing.  Neck: Appears normal, supple, no cervical masses, normal ROM, no appreciable thyromegaly; no  JVD Respiratory: Diminished to auscultation bilaterally, no wheezing, rales, rhonchi or crackles. Normal respiratory effort and patient is not tachypenic. No accessory muscle use. Unlabored breathing  Cardiovascular: RRR, no murmurs / rubs / gallops. S1 and S2 auscultated. 1+ LE edema Abdomen: Soft, non-tender, non-distended. Bowel sounds positive.  GU: Deferred. Musculoskeletal: No clubbing / cyanosis of digits/nails. No joint deformity upper and lower extremities. Has right knee pain Skin: No rashes, lesions, ulcers on a limited skin. No induration; Warm and dry.  Neurologic: CN 2-12 grossly intact with no focal deficits. Romberg sign and cerebellar reflexes not assessed.  Psychiatric: Normal judgment and insight. Alert and oriented x 3. Normal mood and  appropriate affect.   Data Reviewed: I have personally reviewed following labs and imaging studies  CBC: Recent Labs  Lab 02/05/21 0558 02/06/21 0239 02/06/21 1653 02/07/21 0805 02/08/21 0234  WBC 20.4* 22.3* 20.2* 21.4* 18.8*  NEUTROABS 16.0* 18.1* 17.4* 18.4* 15.8*  HGB 10.6* 10.5* 10.8* 10.1* 9.6*  HCT 30.0* 29.9* 31.0* 28.7* 28.1*  MCV 85.5 86.4 88.3 87.5 89.5  PLT 425* 397 414* 427* 444*    Basic Metabolic Panel: Recent Labs  Lab 02/04/21 0305 02/04/21 1645 02/05/21 0558 02/05/21 1648 02/06/21 0239 02/07/21 0236 02/07/21 0805 02/08/21 0234  NA  --    < > 127* 125* 125* 125*  --  128*  K  --    < > 3.9 4.1 3.8 3.9  --  4.2  CL  --    < > 91* 90* 91* 91*  --  92*  CO2  --    < > '27 26 26 29  ' --  29  GLUCOSE  --    < > 174* 246* 176* 113*  --  142*  BUN  --    < > '8 8 8 8  ' --  7  CREATININE  --    < > 0.54* 0.54* 0.47* 0.43*  --  0.44*  CALCIUM  --    < > 7.6* 7.6* 7.6* 7.7*  --  7.9*  MG 2.2  --  2.1  --  2.1  --  2.1 2.0  PHOS 2.9  --  3.3  --  4.2  --  4.4 4.5   < > = values in this interval not displayed.    GFR: Estimated Creatinine Clearance: 124.9 mL/min (A) (by C-G formula based on SCr of 0.44 mg/dL (L)). Liver Function Tests: Recent Labs  Lab 02/01/21 1743 02/02/21 1211 02/04/21 0300 02/08/21 0234  AST  --  71* 43* 61*  ALT  --  39 21 29  ALKPHOS  --  106 95 98  BILITOT  --  1.1 1.1 0.7  PROT  --  6.0* 5.1* 5.3*  ALBUMIN 2.0* 2.1* 1.6* 1.8*    No results for input(s): LIPASE, AMYLASE in the last 168 hours. No results for input(s): AMMONIA in the last 168 hours. Coagulation Profile: No results for input(s): INR, PROTIME in the last 168 hours.  Cardiac Enzymes: No results for input(s): CKTOTAL, CKMB, CKMBINDEX, TROPONINI in the last 168 hours. BNP (last 3 results) No results for input(s): PROBNP in the last 8760 hours. HbA1C: No results for input(s): HGBA1C in the last 72 hours. CBG: Recent Labs  Lab 02/07/21 1153 02/07/21 1617  02/07/21 2153 02/08/21 0747 02/08/21 1219  GLUCAP 230* 219* 236* 146* 135*    Lipid Profile: No results for input(s): CHOL, HDL, LDLCALC, TRIG, CHOLHDL, LDLDIRECT in the last 72 hours. Thyroid Function Tests: Recent Labs    02/06/21 1526  TSH 5.745*    Anemia Panel: No results for input(s): VITAMINB12, FOLATE, FERRITIN, TIBC, IRON, RETICCTPCT in the last 72 hours. Sepsis Labs: Recent Labs  Lab 02/04/21 0739  LATICACIDVEN 1.5     Recent Results (from the past 240 hour(s))  Urine culture     Status: Abnormal   Collection Time: 01/31/21  8:51 AM   Specimen: In/Out Cath Urine  Result Value Ref Range Status   Specimen Description   Final    IN/OUT CATH URINE Performed at [facility], [place] [address] [place], [place] [address]    Special Requests   Final    NONE Performed at [facility], [place] [address], [place], [place] [address]    Culture >=100,000 COLONIES/mL STAPHYLOCOCCUS AUREUS (A)  Final   Report Status 02/02/2021 FINAL  Final   Organism ID, Bacteria STAPHYLOCOCCUS AUREUS (A)  Final      Susceptibility   Staphylococcus aureus - MIC*    CIPROFLOXACIN >=8 RESISTANT Resistant     GENTAMICIN <=0.5 SENSITIVE Sensitive     NITROFURANTOIN <=16 SENSITIVE Sensitive     OXACILLIN 0.5 SENSITIVE Sensitive     TETRACYCLINE <=1 SENSITIVE Sensitive     VANCOMYCIN 1 SENSITIVE Sensitive     TRIMETH/SULFA <=10 SENSITIVE Sensitive     CLINDAMYCIN <=0.25 SENSITIVE Sensitive     RIFAMPIN <=0.5 SENSITIVE Sensitive     Inducible Clindamycin NEGATIVE Sensitive     * >=100,000 COLONIES/mL STAPHYLOCOCCUS AUREUS  Resp Panel by RT-PCR (Flu A&B, Covid) Nasopharyngeal Swab     Status: Abnormal   Collection Time: 01/31/21 10:59 AM   Specimen: Nasopharyngeal Swab; Nasopharyngeal(NP) swabs in vial transport medium  Result Value Ref Range Status   SARS Coronavirus 2 by RT PCR POSITIVE (A) NEGATIVE Final    Comment: RESULT CALLED TO, READ BACK BY AND  VERIFIED WITH: GARRISON,G. RN AT 1206 01/31/21 MULLINS,T (NOTE) SARS-CoV-2 target nucleic acids are DETECTED.  The SARS-CoV-2 RNA is generally detectable in upper respiratory specimens during the acute phase of infection. Positive results are indicative of the presence of the identified virus, but do not rule out bacterial infection or co-infection with other pathogens not detected by the test. Clinical correlation with patient history and other diagnostic information is necessary to determine patient infection status. The expected result is Negative.  Fact Sheet for Patients: EntrepreneurPulse.com.au  Fact Sheet for Healthcare Providers: IncredibleEmployment.be  This test is not yet approved or cleared by the [place] FDA and  has been authorized for detection and/or diagnosis of SARS-CoV-2 by FDA under an Emergency Use Authorization (EUA).  This EUA will remain in effect (meaning this tes t can be used) for the duration of  the COVID-19 declaration under Section 564(b)(1) of the Act, 21 U.S.C. section 360bbb-3(b)(1), unless the authorization is terminated or revoked sooner.     Influenza A by PCR NEGATIVE NEGATIVE Final   Influenza B by PCR NEGATIVE NEGATIVE Final    Comment: (NOTE) The Xpert Xpress SARS-CoV-2/FLU/RSV plus assay is intended as an aid in the diagnosis of influenza from Nasopharyngeal swab specimens and should not be used as a sole basis for treatment. Nasal washings and aspirates are unacceptable for Xpert Xpress SARS-CoV-2/FLU/RSV testing.  Fact Sheet for Patients: EntrepreneurPulse.com.au  Fact Sheet for Healthcare Providers: IncredibleEmployment.be  This test is not yet approved or cleared by the [place] FDA and has been authorized for detection and/or diagnosis of SARS-CoV-2 by FDA under an Emergency Use Authorization (EUA). This EUA will remain in effect (meaning this  test can be used) for the duration of the COVID-19 declaration under Section 564(b)(1) of the Act, 21 U.S.C. section 360bbb-3(b)(1), unless the authorization is terminated or revoked.  Performed at [facility], [place] [address], [place], [place] [address]   Blood Culture (routine x 2)     Status: Abnormal   Collection Time: 01/31/21 10:59 AM   Specimen: BLOOD  Result Value Ref Range Status   Specimen Description   Final    BLOOD LEFT ANTECUBITAL Performed at [facility], [place] [address], [place], [place] [address]    Special Requests   Final    BOTTLES DRAWN AEROBIC AND ANAEROBIC Blood Culture adequate volume Performed at [place] [address], [place], [place] [address]    Culture  Setup Time   Final    GRAM POSITIVE COCCI IN CLUSTERS IN BOTH AEROBIC AND ANAEROBIC BOTTLES CRITICAL RESULT CALLED TO, READ BACK BY AND VERIFIED WITH: [place] L. [id number] FCP Performed at [facility], [place] [address] [place], [place] [address]    Culture STAPHYLOCOCCUS AUREUS (A)  Final   Report Status 02/04/2021 FINAL  Final   Organism ID, Bacteria STAPHYLOCOCCUS AUREUS  Final      Susceptibility   Staphylococcus aureus - MIC*    CIPROFLOXACIN >=8 RESISTANT Resistant     ERYTHROMYCIN >=8 RESISTANT Resistant     GENTAMICIN <=0.5 SENSITIVE Sensitive     OXACILLIN 0.5 SENSITIVE Sensitive     TETRACYCLINE <=1 SENSITIVE Sensitive     VANCOMYCIN <=0.5 SENSITIVE Sensitive     TRIMETH/SULFA <=10 SENSITIVE Sensitive     CLINDAMYCIN <=0.25 SENSITIVE Sensitive     RIFAMPIN <=0.5 SENSITIVE Sensitive     Inducible Clindamycin NEGATIVE Sensitive     * STAPHYLOCOCCUS AUREUS  Blood Culture ID Panel (Reflexed)     Status: Abnormal   Collection Time: 01/31/21 10:59 AM  Result Value Ref Range Status   Enterococcus faecalis NOT DETECTED NOT DETECTED Final   Enterococcus Faecium NOT DETECTED NOT DETECTED Final   Listeria  monocytogenes NOT DETECTED NOT DETECTED Final   Staphylococcus species DETECTED (A) NOT DETECTED Final    Comment: CRITICAL RESULT CALLED TO, READ BACK BY AND VERIFIED WITH: PHARMD MICHELLE L. [id number] FCP    Staphylococcus aureus (BCID) DETECTED (A) NOT DETECTED Final    Comment: CRITICAL RESULT CALLED TO, READ BACK BY AND VERIFIED WITH: PHARMD MICHELLE L. [id number] FCP    Staphylococcus epidermidis NOT DETECTED NOT DETECTED Final   Staphylococcus lugdunensis NOT DETECTED NOT DETECTED Final   Streptococcus species NOT DETECTED NOT DETECTED Final   Streptococcus agalactiae NOT DETECTED NOT DETECTED Final   Streptococcus pneumoniae NOT DETECTED NOT DETECTED Final   Streptococcus pyogenes NOT DETECTED NOT DETECTED Final   A.calcoaceticus-baumannii NOT DETECTED NOT DETECTED Final   Bacteroides fragilis NOT DETECTED NOT DETECTED Final   Enterobacterales NOT DETECTED NOT DETECTED Final   Enterobacter cloacae complex NOT DETECTED NOT DETECTED Final   Escherichia coli NOT DETECTED NOT DETECTED Final   Klebsiella aerogenes NOT DETECTED NOT DETECTED Final   Klebsiella oxytoca NOT DETECTED NOT DETECTED Final   Klebsiella pneumoniae NOT DETECTED NOT DETECTED Final   Proteus species NOT DETECTED NOT DETECTED Final   Salmonella species NOT DETECTED NOT DETECTED Final   Serratia marcescens NOT DETECTED NOT DETECTED Final   Haemophilus influenzae NOT DETECTED NOT DETECTED Final   Neisseria meningitidis NOT DETECTED NOT DETECTED Final   Pseudomonas aeruginosa NOT DETECTED NOT DETECTED Final   Stenotrophomonas maltophilia NOT DETECTED NOT DETECTED Final   Candida albicans NOT DETECTED NOT DETECTED Final   Candida auris NOT DETECTED NOT DETECTED Final   Candida glabrata NOT DETECTED NOT DETECTED Final   Candida krusei NOT DETECTED NOT DETECTED Final   Candida parapsilosis NOT DETECTED NOT DETECTED Final   Candida tropicalis NOT DETECTED NOT DETECTED Final   Cryptococcus neoformans/gattii NOT  DETECTED NOT DETECTED Final   Meth resistant mecA/C and MREJ NOT DETECTED NOT DETECTED Final    Comment:  Performed at [facility], [place] [address], [place], [place] [address]  Blood Culture (routine x 2)     Status: Abnormal   Collection Time: 01/31/21 11:15 AM   Specimen: BLOOD  Result Value Ref Range Status   Specimen Description   Final    BLOOD RIGHT ANTECUBITAL Performed at [place] [address] [place], [place] [address]    Special Requests   Final    BOTTLES DRAWN AEROBIC AND ANAEROBIC Blood Culture adequate volume Performed at [place] [address], [place], [place] [address]    Culture  Setup Time   Final    GRAM POSITIVE COCCI IN CLUSTERS IN BOTH AEROBIC AND ANAEROBIC BOTTLES CRITICAL VALUE NOTED.  VALUE IS CONSISTENT WITH PREVIOUSLY REPORTED AND CALLED VALUE.    Culture (A)  Final    STAPHYLOCOCCUS AUREUS SUSCEPTIBILITIES PERFORMED ON PREVIOUS CULTURE WITHIN THE LAST 5 DAYS. Performed at [facility], [place] [address], [place], [place] [address]    Report Status 02/03/2021 FINAL  Final  MRSA PCR Screening     Status: None   Collection Time: 01/31/21  2:28 PM   Specimen: Nasal Mucosa; Nasopharyngeal  Result Value Ref Range Status   MRSA by PCR NEGATIVE NEGATIVE Final    Comment:        The GeneXpert MRSA Assay (FDA approved for NASAL specimens only), is one component of a comprehensive MRSA colonization surveillance program. It is not intended to diagnose MRSA infection nor to guide or monitor treatment for MRSA infections. Performed at [facility], [place] [address] [place], [place] [address]   Culture, blood (routine x 2)     Status: None   Collection Time: 02/01/21  2:20 PM   Specimen: BLOOD  Result Value Ref Range Status   Specimen Description   Final    BLOOD RIGHT ANTECUBITAL Performed at [facility] [address] [place], [place] [address]     Special Requests   Final    BOTTLES DRAWN AEROBIC AND ANAEROBIC Blood Culture adequate volume Performed at [facility] [address] [place], [place] [address]    Culture   Final    NO GROWTH 5 DAYS Performed at [facility], [place] [address], [place], [place] [address]    Report Status 02/06/2021 FINAL  Final  Culture, blood (routine x 2)     Status: None   Collection Time: 02/01/21  2:20 PM   Specimen: BLOOD RIGHT HAND  Result Value Ref Range Status   Specimen Description   Final    BLOOD RIGHT HAND Performed at [facility] [address], [place], [place] [address]    Special Requests   Final    BOTTLES DRAWN AEROBIC AND ANAEROBIC Blood Culture adequate volume Performed at [facility] [address], [place], [place] [address]    Culture   Final    NO GROWTH 5 DAYS Performed at [facility], [place] [address] [place], [place] [address]    Report Status 02/06/2021 FINAL  Final  Aerobic/Anaerobic Culture w Gram Stain (surgical/deep wound)     Status: None   Collection Time: 02/02/21  4:12 PM   Specimen: Abscess  Result Value Ref Range Status   Specimen Description   Final    ABSCESS SUBSCAPULAR Performed at [facility] [address], [place],  [address]    Special Requests   Final  NONE Performed at [facility], [place] [address], [place], [place] [address]    Gram Stain   Final    ABUNDANT WBC PRESENT, PREDOMINANTLY PMN ABUNDANT GRAM POSITIVE COCCI IN CLUSTERS IN TETRADS    Culture   Final    ABUNDANT STAPHYLOCOCCUS AUREUS NO ANAEROBES ISOLATED Performed at [facility], [place] [address] [place], [place] [address]    Report Status 02/07/2021 FINAL  Final   Organism ID, Bacteria STAPHYLOCOCCUS AUREUS  Final      Susceptibility   Staphylococcus aureus - MIC*    CIPROFLOXACIN >=8 RESISTANT Resistant     ERYTHROMYCIN >=8 RESISTANT Resistant      GENTAMICIN <=0.5 SENSITIVE Sensitive     OXACILLIN 0.5 SENSITIVE Sensitive     TETRACYCLINE <=1 SENSITIVE Sensitive     VANCOMYCIN <=0.5 SENSITIVE Sensitive     TRIMETH/SULFA <=10 SENSITIVE Sensitive     CLINDAMYCIN <=0.25 SENSITIVE Sensitive     RIFAMPIN <=0.5 SENSITIVE Sensitive     Inducible Clindamycin NEGATIVE Sensitive     * ABUNDANT STAPHYLOCOCCUS AUREUS      RN Pressure Injury Documentation: Pressure Injury 01/31/21 Buttocks Right;Left;Posterior Stage 2 -  Partial thickness loss of dermis presenting as a shallow open injury with a red, pink wound bed without slough. (Active)  01/31/21 1546  Location: Buttocks  Location Orientation: Right;Left;Posterior  Staging: Stage 2 -  Partial thickness loss of dermis presenting as a shallow open injury with a red, pink wound bed without slough.  Wound Description (Comments):   Present on Admission: Yes     Pressure Injury (Active)     Location:   Location Orientation:   Staging:   Wound Description (Comments):   Present on Admission:      Estimated body mass index is 30.78 kg/m as calculated from the following:   Height as of this encounter: '5\' 10"'  (1.778 m).   Weight as of this encounter: 97.3 kg.  Malnutrition Type:   Malnutrition Characteristics:   Nutrition Interventions:    Radiology Studies: DG Knee 1-2 Views Right  Result Date: 02/08/2021 CLINICAL DATA:  Right knee pain EXAM: RIGHT KNEE - 2 VIEW COMPARISON:  None. FINDINGS: Medial joint space narrowing is noted with mild osteophytic change. Mild patellofemoral changes are noted as well. No joint effusion is seen. No acute fracture is noted. IMPRESSION: Mild degenerative change without acute abnormality. Electronically Signed   By: Inez Catalina M.D.   On: 02/08/2021 09:30   MR Shoulder Right W Wo Contrast  Result Date: 02/07/2021 CLINICAL DATA:  Right shoulder pain and stiffness. History of MSSA bacteremia, septic pulmonary emboli, and endocarditis. EXAM: MRI OF  THE RIGHT SHOULDER WITHOUT AND WITH CONTRAST TECHNIQUE: Multiplanar, multisequence MR imaging of the right shoulder was performed before and after the administration of intravenous contrast. CONTRAST:  74m GADAVIST GADOBUTROL 1 MMOL/ML IV SOLN COMPARISON:  Right shoulder x-rays dated February 01, 2021. FINDINGS: Rotator cuff: Full-thickness, essentially full width tear of the supraspinatus tendon with 2.1 cm retraction to the top of the humeral head. High-grade partial-thickness articular surface tear of the mid to distal infraspinatus tendon. The teres minor and subscapularis tendons are intact. Muscles:  Mild infraspinatus muscle edema.  No muscle atrophy. Biceps long head: Partially torn within the bicipital groove. Normally positioned. Moderate tendinosis. Acromioclavicular Joint: Moderate arthropathy of the acromioclavicular joint. Type II acromion. Moderate amount of fluid in the subacromial/subdeltoid bursa extending into the subcoracoid bursa. Small amount of fluid superior to the acromioclavicular joint communicating  with the subacromial/subdeltoid bursa through the joint space. Glenohumeral Joint: Trace joint effusion. Diffuse cartilage thinning without focal defect. Labrum: Grossly intact, but evaluation is limited by lack of intraarticular fluid. Bones: No acute fracture or dislocation. No suspicious bone lesion. Other: None. IMPRESSION: 1. No convincing evidence of septic arthritis. Moderate amount of fluid in the subacromial/subdeltoid bursa and subcoracoid bursa is favored related to underlying rotator cuff pathology described below rather than septic bursitis, although fluid sampling could be obtained for definitive diagnosis. 2. Full-thickness, essentially full width tear of the supraspinatus tendon with 2.1 cm retraction to the top of the humeral head. No muscle atrophy. 3. High-grade partial-thickness articular surface tear of the mid to distal infraspinatus tendon. 4. Partially torn long head biceps  tendon within the bicipital groove. 5. Moderate acromioclavicular and mild glenohumeral osteoarthritis. Electronically Signed   By: Titus Dubin M.D.   On: 02/07/2021 08:55   MR Shoulder Left W Wo Contrast  Result Date: 02/07/2021 CLINICAL DATA:  Left shoulder pain and stiffness. History of MSSA bacteremia, septic pulmonary emboli, and endocarditis. EXAM: MRI OF THE LEFT SHOULDER WITHOUT AND WITH CONTRAST TECHNIQUE: Multiplanar, multisequence MR imaging of the left shoulder was performed before and after the administration of intravenous contrast. CONTRAST:  27m GADAVIST GADOBUTROL 1 MMOL/ML IV SOLN, 149mGADAVIST GADOBUTROL 1 MMOL/ML IV SOLN COMPARISON:  Left shoulder x-rays dated February 01, 2021. FINDINGS: Rotator cuff: Full-thickness, partial width tear involving the anterior supraspinatus tendon with 2.2 cm retraction to the top of the humeral head. Additional high-grade partial-thickness articular surface tear of the posterior supraspinatus tendon. High-grade partial-thickness articular surface tear of the superior subscapularis tendon. The infraspinatus and teres minor tendons are intact. Muscles:  Mild reactive rotator cuff muscle edema.  No atrophy. Biceps long head:  Completely torn within the bicipital groove. Acromioclavicular Joint: Moderate arthropathy of the acromioclavicular joint. Small joint effusion with thick synovial enhancement (series 12, image 5). Type II acromion. Large amount of fluid within the subacromial/subdeltoid bursa and subcoracoid bursa with synovial enhancement. Fluid extends proximally along the supraspinatus, infraspinatus, and subscapularis muscles. Glenohumeral Joint: Small joint effusion with thick synovial enhancement. Diffuse cartilage thinning without focal defect. Labrum:  Intact. Bones: Mild marrow edema and enhancement in the greater tuberosity with relatively preserved T1 marrow signal, likely reactive to rotator cuff pathology. No acute fracture or dislocation. No  suspicious bone lesion. Other: Large rim enhancing fluid collection in the left axilla, incompletely visualized (series 12, image 12). IMPRESSION: 1. Findings concerning for glenohumeral and acromioclavicular septic arthritis with septic subacromial/subdeltoid and subcoracoid bursitis. No osteomyelitis. 2. Large abscess in the left axilla, incompletely visualized. 3. Full-thickness, partial width tear of the anterior supraspinatus tendon with 2.2 cm retraction to the top of the humeral head. Additional high-grade partial-thickness articular surface tear of the posterior supraspinatus tendon. 4. High-grade partial-thickness articular surface tear of the superior subscapularis tendon. 5. Complete tear of the biceps long head tendon within the bicipital groove. Electronically Signed   By: WiTitus Dubin.D.   On: 02/07/2021 08:42    Scheduled Meds:  acetaminophen  1,000 mg Oral Once   chlorhexidine  60 mL Topical Once   Chlorhexidine Gluconate Cloth  6 each Topical Daily   [START ON 02/09/2021] enoxaparin (LOVENOX) injection  40 mg Subcutaneous Q24H   gabapentin  800 mg Oral TID   insulin aspart  0-15 Units Subcutaneous TID WC   insulin aspart  0-5 Units Subcutaneous QHS   insulin glargine  15 Units Subcutaneous Daily  mouth rinse  15 mL Mouth Rinse BID   oxyCODONE  20 mg Oral Q12H   polyethylene glycol  17 g Oral Daily   povidone-iodine  2 application Topical Once   senna-docusate  1 tablet Oral BID   Continuous Infusions:  sodium chloride Stopped (02/07/21 0508)    ceFAZolin (ANCEF) IV Stopped (02/08/21 0532)    LOS: 8 days   Kerney Elbe, DO [facility] PAGER is on AMION  If 7PM-7AM, please contact night-coverage [email or web address]

## 2021-02-08 NOTE — Transfer of Care (Signed)
Immediate Anesthesia Transfer of Care Note  Patient: John Ware  Procedure(s) Performed: MINOR INCISION AND DRAINAGE OF ABSCESS (Left: Shoulder)  Patient Location: PACU  Anesthesia Type:General  Level of Consciousness: sedated, patient cooperative and responds to stimulation  Airway & Oxygen Therapy: Patient Spontanous Breathing and Patient connected to face mask oxygen  Post-op Assessment: Report given to RN and Post -op Vital signs reviewed and stable  Post vital signs: Reviewed and stable  Last Vitals:  Vitals Value Taken Time  BP 90/72 02/08/21 1945  Temp    Pulse 86 02/08/21 1946  Resp 17 02/08/21 1946  SpO2 98 % 02/08/21 1946  Vitals shown include unvalidated device data.  Last Pain:  Vitals:   02/08/21 1540  TempSrc:   PainSc: 10-Worst pain ever      Patients Stated Pain Goal: 0 (02/08/21 1300)  Complications: No notable events documented.

## 2021-02-08 NOTE — Progress Notes (Signed)
The patient has been re-examined, and the chart reviewed, and there have been no interval changes to the documented history and physical.    The risks, benefits, and alternatives have been discussed at length, and the patient is willing to proceed.  Large abscess involving entirety of left shoulder girdle extending into the chest wall.  Plan open I&D.  May need multiple operations.    Eulas Post, MD

## 2021-02-08 NOTE — Progress Notes (Signed)
[facility]    Date of Admission:  01/31/2021   Total days of antibiotics 8           ID: John Ware is a 55 y.o. male with  disseminated mssa infection Active Problems:   Diabetes mellitus (HCC)   Hyponatremia   Pressure injury of skin   Bacteremia due to Staphylococcus aureus   HTN (hypertension)   Anxiety   Left shoulder pain   Ascending aorta dilatation (HCC)   Vision changes   Endocarditis    Subjective: Unable to get meds this morning (including long acting opiate) he reports feeling poorly due tp pain. Not had a fever x 24hr  Medications:   acetaminophen  1,000 mg Oral Once   chlorhexidine  60 mL Topical Once   Chlorhexidine Gluconate Cloth  6 each Topical Daily   [START ON 02/09/2021] enoxaparin (LOVENOX) injection  40 mg Subcutaneous Q24H   gabapentin  800 mg Oral TID   insulin aspart  0-15 Units Subcutaneous TID WC   insulin aspart  0-5 Units Subcutaneous QHS   insulin glargine  15 Units Subcutaneous Daily   mouth rinse  15 mL Mouth Rinse BID   oxyCODONE  20 mg Oral Q12H   polyethylene glycol  17 g Oral Daily   povidone-iodine  2 application Topical Once   senna-docusate  1 tablet Oral BID    Objective: Vital signs in last 24 hours: Temp:  [97.9 F (36.6 C)-99.4 F (37.4 C)] 97.9 F (36.6 C) (06/16 1200) Pulse Rate:  [78-101] 80 (06/16 1300) Resp:  [12-26] 18 (06/16 1300) BP: (96-135)/(57-79) 110/67 (06/16 1200) SpO2:  [88 %-100 %] 94 % (06/16 1300)  Physical Exam  Constitutional: He is oriented to person, place, and time. He appears well-developed and well-nourished. No distress.  HENT:  Mouth/Throat: Oropharynx is clear and moist. No oropharyngeal exudate.  Cardiovascular: Normal rate, regular rhythm and normal heart sounds. Exam reveals no gallop and no friction rub.  No murmur heard.  Pulmonary/Chest: Effort normal and breath sounds normal. No respiratory distress. He has no wheezes.  Abdominal: Soft. Bowel  sounds are normal. He exhibits no distension. There is no tenderness.  Lymphadenopathy:  He has no cervical adenopathy.  Neurological: He is alert and oriented to person, place, and time.  Skin: Skin is warm and dry. No rash noted. No erythema.  Psychiatric: He has a normal mood and affect. His behavior is normal.    Lab Results Recent Labs    02/07/21 0236 02/07/21 0805 02/08/21 0234  WBC  --  21.4* 18.8*  HGB  --  10.1* 9.6*  HCT  --  28.7* 28.1*  NA 125*  --  128*  K 3.9  --  4.2  CL 91*  --  92*  CO2 29  --  29  BUN 8  --  7  CREATININE 0.43*  --  0.44*   Liver Panel Recent Labs    02/08/21 0234  PROT 5.3*  ALBUMIN 1.8*  AST 61*  ALT 29  ALKPHOS 98  BILITOT 0.7   Sedimentation Rate Recent Labs    02/07/21 1900  ESRSEDRATE 41*   C-Reactive Protein Recent Labs    02/07/21 1900  CRP 15.5*    Microbiology: reviewed Studies/Results: DG Knee 1-2 Views Right  Result Date: 02/08/2021 CLINICAL DATA:  Right knee pain EXAM: RIGHT KNEE - 2 VIEW COMPARISON:  None. FINDINGS: Medial joint space narrowing is noted with mild osteophytic change.  Mild patellofemoral changes are noted as well. No joint effusion is seen. No acute fracture is noted. IMPRESSION: Mild degenerative change without acute abnormality. Electronically Signed   By: Alcide Clever M.D.   On: 02/08/2021 09:30   MR Shoulder Right W Wo Contrast  Result Date: 02/07/2021 CLINICAL DATA:  Right shoulder pain and stiffness. History of MSSA bacteremia, septic pulmonary emboli, and endocarditis. EXAM: MRI OF THE RIGHT SHOULDER WITHOUT AND WITH CONTRAST TECHNIQUE: Multiplanar, multisequence MR imaging of the right shoulder was performed before and after the administration of intravenous contrast. CONTRAST:  79mL GADAVIST GADOBUTROL 1 MMOL/ML IV SOLN COMPARISON:  Right shoulder x-rays dated February 01, 2021. FINDINGS: Rotator cuff: Full-thickness, essentially full width tear of the supraspinatus tendon with 2.1 cm  retraction to the top of the humeral head. High-grade partial-thickness articular surface tear of the mid to distal infraspinatus tendon. The teres minor and subscapularis tendons are intact. Muscles:  Mild infraspinatus muscle edema.  No muscle atrophy. Biceps long head: Partially torn within the bicipital groove. Normally positioned. Moderate tendinosis. Acromioclavicular Joint: Moderate arthropathy of the acromioclavicular joint. Type II acromion. Moderate amount of fluid in the subacromial/subdeltoid bursa extending into the subcoracoid bursa. Small amount of fluid superior to the acromioclavicular joint communicating with the subacromial/subdeltoid bursa through the joint space. Glenohumeral Joint: Trace joint effusion. Diffuse cartilage thinning without focal defect. Labrum: Grossly intact, but evaluation is limited by lack of intraarticular fluid. Bones: No acute fracture or dislocation. No suspicious bone lesion. Other: None. IMPRESSION: 1. No convincing evidence of septic arthritis. Moderate amount of fluid in the subacromial/subdeltoid bursa and subcoracoid bursa is favored related to underlying rotator cuff pathology described below rather than septic bursitis, although fluid sampling could be obtained for definitive diagnosis. 2. Full-thickness, essentially full width tear of the supraspinatus tendon with 2.1 cm retraction to the top of the humeral head. No muscle atrophy. 3. High-grade partial-thickness articular surface tear of the mid to distal infraspinatus tendon. 4. Partially torn long head biceps tendon within the bicipital groove. 5. Moderate acromioclavicular and mild glenohumeral osteoarthritis. Electronically Signed   By: Obie Dredge M.D.   On: 02/07/2021 08:55   MR Shoulder Left W Wo Contrast  Result Date: 02/07/2021 CLINICAL DATA:  Left shoulder pain and stiffness. History of MSSA bacteremia, septic pulmonary emboli, and endocarditis. EXAM: MRI OF THE LEFT SHOULDER WITHOUT AND WITH  CONTRAST TECHNIQUE: Multiplanar, multisequence MR imaging of the left shoulder was performed before and after the administration of intravenous contrast. CONTRAST:  47mL GADAVIST GADOBUTROL 1 MMOL/ML IV SOLN, 23mL GADAVIST GADOBUTROL 1 MMOL/ML IV SOLN COMPARISON:  Left shoulder x-rays dated February 01, 2021. FINDINGS: Rotator cuff: Full-thickness, partial width tear involving the anterior supraspinatus tendon with 2.2 cm retraction to the top of the humeral head. Additional high-grade partial-thickness articular surface tear of the posterior supraspinatus tendon. High-grade partial-thickness articular surface tear of the superior subscapularis tendon. The infraspinatus and teres minor tendons are intact. Muscles:  Mild reactive rotator cuff muscle edema.  No atrophy. Biceps long head:  Completely torn within the bicipital groove. Acromioclavicular Joint: Moderate arthropathy of the acromioclavicular joint. Small joint effusion with thick synovial enhancement (series 12, image 5). Type II acromion. Large amount of fluid within the subacromial/subdeltoid bursa and subcoracoid bursa with synovial enhancement. Fluid extends proximally along the supraspinatus, infraspinatus, and subscapularis muscles. Glenohumeral Joint: Small joint effusion with thick synovial enhancement. Diffuse cartilage thinning without focal defect. Labrum:  Intact. Bones: Mild marrow edema and enhancement in the greater tuberosity  with relatively preserved T1 marrow signal, likely reactive to rotator cuff pathology. No acute fracture or dislocation. No suspicious bone lesion. Other: Large rim enhancing fluid collection in the left axilla, incompletely visualized (series 12, image 12). IMPRESSION: 1. Findings concerning for glenohumeral and acromioclavicular septic arthritis with septic subacromial/subdeltoid and subcoracoid bursitis. No osteomyelitis. 2. Large abscess in the left axilla, incompletely visualized. 3. Full-thickness, partial width tear  of the anterior supraspinatus tendon with 2.2 cm retraction to the top of the humeral head. Additional high-grade partial-thickness articular surface tear of the posterior supraspinatus tendon. 4. High-grade partial-thickness articular surface tear of the superior subscapularis tendon. 5. Complete tear of the biceps long head tendon within the bicipital groove. Electronically Signed   By: Obie Dredge M.D.   On: 02/07/2021 08:42     Assessment/Plan: Disseminated mssa infection(bacteremia, TV endocarditis, pulm septic emboli, subscapular/axillary abscess/infected hematoma, and left AC, GH septic arthritis) = continue on cefazolin. Appreciate ortho quick assessment and going for wash out today. Will also need to coordinate with IR to see what fluid collections remain.  Continue on cefazolin  Leukocytosis = improving  Chronic opiate use = will likely need his morning dose vs. Iv equivalent for his pain management  [facility] Cell: [phone] Pager: [phone]  02/08/2021, 2:19 PM

## 2021-02-09 ENCOUNTER — Encounter (HOSPITAL_COMMUNITY): Payer: Self-pay | Admitting: Orthopedic Surgery

## 2021-02-09 ENCOUNTER — Inpatient Hospital Stay (HOSPITAL_COMMUNITY): Payer: Commercial Managed Care - PPO

## 2021-02-09 DIAGNOSIS — I77810 Thoracic aortic ectasia: Secondary | ICD-10-CM | POA: Diagnosis not present

## 2021-02-09 DIAGNOSIS — R7881 Bacteremia: Secondary | ICD-10-CM | POA: Diagnosis not present

## 2021-02-09 DIAGNOSIS — A419 Sepsis, unspecified organism: Secondary | ICD-10-CM | POA: Diagnosis not present

## 2021-02-09 DIAGNOSIS — F419 Anxiety disorder, unspecified: Secondary | ICD-10-CM | POA: Diagnosis not present

## 2021-02-09 LAB — CBC WITH DIFFERENTIAL/PLATELET
Abs Immature Granulocytes: 0.31 10*3/uL — ABNORMAL HIGH (ref 0.00–0.07)
Basophils Absolute: 0 10*3/uL (ref 0.0–0.1)
Basophils Relative: 0 %
Eosinophils Absolute: 0 10*3/uL (ref 0.0–0.5)
Eosinophils Relative: 0 %
HCT: 30.4 % — ABNORMAL LOW (ref 39.0–52.0)
Hemoglobin: 10.2 g/dL — ABNORMAL LOW (ref 13.0–17.0)
Immature Granulocytes: 2 %
Lymphocytes Relative: 4 %
Lymphs Abs: 0.8 10*3/uL (ref 0.7–4.0)
MCH: 30.8 pg (ref 26.0–34.0)
MCHC: 33.6 g/dL (ref 30.0–36.0)
MCV: 91.8 fL (ref 80.0–100.0)
Monocytes Absolute: 0.2 10*3/uL (ref 0.1–1.0)
Monocytes Relative: 1 %
Neutro Abs: 17.2 10*3/uL — ABNORMAL HIGH (ref 1.7–7.7)
Neutrophils Relative %: 93 %
Platelets: 460 10*3/uL — ABNORMAL HIGH (ref 150–400)
RBC: 3.31 MIL/uL — ABNORMAL LOW (ref 4.22–5.81)
RDW: 15.3 % (ref 11.5–15.5)
WBC: 18.5 10*3/uL — ABNORMAL HIGH (ref 4.0–10.5)
nRBC: 0 % (ref 0.0–0.2)

## 2021-02-09 LAB — COMPREHENSIVE METABOLIC PANEL
ALT: 25 U/L (ref 0–44)
AST: 35 U/L (ref 15–41)
Albumin: 1.7 g/dL — ABNORMAL LOW (ref 3.5–5.0)
Alkaline Phosphatase: 95 U/L (ref 38–126)
Anion gap: 6 (ref 5–15)
BUN: 13 mg/dL (ref 6–20)
CO2: 30 mmol/L (ref 22–32)
Calcium: 8 mg/dL — ABNORMAL LOW (ref 8.9–10.3)
Chloride: 96 mmol/L — ABNORMAL LOW (ref 98–111)
Creatinine, Ser: 0.69 mg/dL (ref 0.61–1.24)
GFR, Estimated: >60 mL/min (ref >60)
Glucose, Bld: 405 mg/dL — ABNORMAL HIGH (ref 70–99)
Potassium: 5.2 mmol/L — ABNORMAL HIGH (ref 3.5–5.1)
Sodium: 132 mmol/L — ABNORMAL LOW (ref 135–145)
Total Bilirubin: 0.7 mg/dL (ref 0.3–1.2)
Total Protein: 5.4 g/dL — ABNORMAL LOW (ref 6.5–8.1)

## 2021-02-09 LAB — GLUCOSE, CAPILLARY
Glucose-Capillary: 381 mg/dL — ABNORMAL HIGH (ref 70–99)
Glucose-Capillary: 447 mg/dL — ABNORMAL HIGH (ref 70–99)
Glucose-Capillary: 294 mg/dL — ABNORMAL HIGH (ref 70–99)
Glucose-Capillary: 127 mg/dL — ABNORMAL HIGH (ref 70–99)

## 2021-02-09 LAB — MAGNESIUM: Magnesium: 1.9 mg/dL (ref 1.7–2.4)

## 2021-02-09 LAB — PHOSPHORUS: Phosphorus: 5 mg/dL — ABNORMAL HIGH (ref 2.5–4.6)

## 2021-02-09 MED ORDER — SODIUM ZIRCONIUM CYCLOSILICATE 10 G PO PACK
10.0000 g | PACK | Freq: Once | ORAL | Status: AC
  Administered 2021-02-09: 10 g via ORAL
  Filled 2021-02-09: qty 1

## 2021-02-09 NOTE — Progress Notes (Signed)
ID PROGRESS NOTE  54yo M with disseminated MSSA infection, still requiring source control for left shoulder/axilla/posterior susbscapular abscess  Appreciate the help from Drs. Magnus Ivan and Landau for debridement and drainage of abscess Continue with cefazolin in the meantime  ID follow back on Monday  Ward Boissonneault B. Drue Second MD MPH [facility] [phone]

## 2021-02-09 NOTE — H&P (View-Only) (Signed)
John Ware Jan 12, 1966  [id number].    Requesting MD: Dr. [name] Chief Complaint/Reason for Consult: left chest wall abscess  HPI:  This is a 55 yo white male with a history of DM, HTN, chronic back pain on chronic narcotics secondary to spinal stenosis, who was admitted last week secondary to general malaise for 3 weeks prior at home.  Most history is taken from the chart as the patient isn't the best historian.  He apparently had a positive at home COVID test on May 20.  He presented to the ED on 5/28 after falling out of a chair and complained of shoulder pain.  His films were negative and he was given a sling for comfort.  He continued to feel poorly and returned on 6/8 as he was unable to walk at this time.  He has since been found to have MSSA bacteremia with endocarditis and a septic left shoulder/scapula that was debrided in the OR yesterday by Dr. Dion Saucier.  He underwent a CT scan today of his chest which revealed a large left lateral chest wall measuring 17x17x6cm in size.  We have been asked to see him for further evaluation and recommendations.   ROS: ROS: Please see HPI, otherwise all other systems are negative except some new BLE edema since admission as well as some right shoulder pain.  Family History  Problem Relation Age of Onset   Colon cancer Neg Hx     Past Medical History:  Diagnosis Date   Depression    Diabetes mellitus without complication (HCC)    Hypertension    Spinal stenosis    With Neurogenic Claudication    Past Surgical History:  Procedure Laterality Date   CARPAL TUNNEL RELEASE Bilateral    IR US GUIDE BX ASP/DRAIN  02/02/2021   IRRIGATION AND DEBRIDEMENT ABSCESS Left 02/08/2021   Procedure: MINOR INCISION AND DRAINAGE OF ABSCESS;  Surgeon: [name], MD;  Location: [facility];  Service: Orthopedics;  Laterality: Left;   LUMBAR LAMINECTOMY/DECOMPRESSION MICRODISCECTOMY N/A 12/13/2014   Procedure: LUMBAR THREE-FOUR, LUMBAR FOUR-FIVE,  LUMBAR FIVE-SACRAL ONE LAMINECTOMY;  Surgeon: [name], MD;  Location: [facility];  Service: Neurosurgery;  Laterality: N/A;  L3-4 L4-5 L5-S1 Laminectomy   MENISCUS REPAIR     Right knee   TEE WITHOUT CARDIOVERSION N/A 02/05/2021   Procedure: TRANSESOPHAGEAL ECHOCARDIOGRAM (TEE);  Surgeon: Quintella Reichert, MD;  Location: 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 His smoking use included cigarettes. He has a 30.00 pack-year smoking history. He has never used smokeless tobacco. He reports current alcohol use. He reports that he does not use drugs.  Allergies:  Allergies  Allergen Reactions   Tizanidine     Leg cramps    Medications Prior to Admission  Medication Sig Dispense Refill   acetaminophen (TYLENOL) 325 MG tablet Take 650-975 mg by mouth every 6 (six) hours as needed for mild pain, fever or headache.     amLODipine (NORVASC) 5 MG tablet Take 1 tablet (5 mg total) by mouth daily. 90 tablet 3   buPROPion (WELLBUTRIN XL) 150 MG 24 hr tablet TAKE (1) TABLET TWICE A DAY. (Patient taking differently: Take 150 mg by mouth 2 (two) times daily.) 60 tablet 5   busPIRone (BUSPAR) 5 MG tablet TAKE 1 TABLET  3 TIMES A DAY (Patient taking differently: Take 5 mg by mouth 3 (three) times daily.) 90 tablet 1   gabapentin (NEURONTIN) 800 MG tablet TAKE (1) TABLET THREE TIMES DAILY. (Patient taking differently: Take 800 mg by mouth 3 (three) times daily.) 90 tablet 2   metFORMIN (GLUCOPHAGE XR) 750 MG 24 hr tablet Take 2 tablets (1,500 mg total) by mouth daily with breakfast. 180 tablet 2   methocarbamol (ROBAXIN) 500 MG tablet Take 1 tablet (500 mg total) by mouth 2 (two) times daily as needed for muscle spasms. 20 tablet 0   oxyCODONE ER (XTAMPZA ER) 18 MG C12A Take 1 tablet by mouth every 12 (twelve) hours.     oxyCODONE-acetaminophen  (PERCOCET) 10-325 MG tablet Take 1 tablet by mouth 3 (three) times daily as needed for pain.     ramipril (ALTACE) 10 MG capsule TAKE (1) CAPSULE DAILY (Patient taking differently: Take 10 mg by mouth daily.) 90 capsule 3   sertraline (ZOLOFT) 100 MG tablet Take 1 tablet (100 mg total) by mouth daily. 30 tablet 5   Vitamin D, Ergocalciferol, (DRISDOL) 1.25 MG (50000 UNIT) CAPS capsule Take 50,000 Units by mouth once a week.     diclofenac Sodium (VOLTAREN) 1 % GEL Apply 2 g topically 4 (four) times daily. (Patient not taking: Reported on 01/31/2021) 100 g 0   empagliflozin (JARDIANCE) 10 MG TABS tablet Take 1 tablet (10 mg total) by mouth daily before breakfast. (Patient not taking: Reported on 01/31/2021) 90 tablet 1     Physical Exam: Blood pressure 110/69, pulse 99, temperature (!) 97.4 F (36.3 C), temperature source Oral, resp. rate 18, height  (1.778 m), weight 97.3 kg, SpO2 90 %. General: pleasant, WD, WN white male who is sitting up in his chair in NAD HEENT: head is normocephalic, atraumatic.  Sclera are noninjected.  PERRL.  Ears and nose without any masses or lesions.  Mouth is pink and moist Heart: regular, rate, and rhythm.  Normal s1,s2. No obvious murmurs, gallops, or rubs noted.  Palpable radial and pedal pulses bilaterally Lungs: CTAB, no wheezes, rhonchi, or rales noted.  Respiratory effort nonlabored Abd: soft, NT, ND, +BS, no masses, hernias, or organomegaly MS: all 4 extremities are symmetrical with no cyanosis, clubbing, except LUE with sling and bulky dressing in place after should surgery.  Some edema of LUE as expected.  BLE edema noted as well Skin: warm and dry with no masses, lesions, or rashes.  He does have swelling and some erythema on the left chest wall more so than the right, laterally.  No crepitus or frank fluctuance.  Neuro: Cranial nerves 2-12 grossly intact, sensation is normal throughout Psych: A&Ox3 with an appropriate affect.   Results for orders  placed or performed during the hospital encounter of 01/31/21 (from the past 48 hour(s))  Glucose, capillary     Status: Abnormal   Collection Time: 02/07/21 11:53 AM  Result Value Ref Range   Glucose-Capillary 230 (H) 70 - 99 mg/dL    Comment: Glucose reference range applies only to samples taken after fasting for at least 8 hours.  Glucose, capillary     Status: Abnormal   Collection Time: 02/07/21  4:17 PM  Result Value Ref Range   Glucose-Capillary 219 (H) 70 - 99 mg/dL    Comment: Glucose reference range applies only to samples taken after fasting for at least 8 hours.  Sedimentation rate     Status: Abnormal   Collection Time: 02/07/21  7:00 PM  Result Value Ref Range   Sed Rate 41 (H) 0 - 16 mm/hr    Comment: Performed at [facility], [address], [place], [place] [address]  C-reactive protein     Status: Abnormal   Collection Time: 02/07/21  7:00 PM  Result Value Ref Range   CRP 15.5 (H) <1.0 mg/dL    Comment: Performed at [facility], [address] [place], [place] [address]  Type and screen [facility]     Status: None   Collection Time: 02/07/21  7:03 PM  Result Value Ref Range   ABO/RH(D) B POS    Antibody Screen NEG    Sample Expiration      02/10/2021,2359 Performed at [facility], [address], [place], [place] [address]   Glucose, capillary     Status: Abnormal   Collection Time: 02/07/21  9:53 PM  Result Value Ref Range   Glucose-Capillary 236 (H) 70 - 99 mg/dL    Comment: Glucose reference range applies only to samples taken after fasting for at least 8 hours.  CBC with Differential/Platelet     Status: Abnormal   Collection Time: 02/08/21  2:34 AM  Result Value Ref Range   WBC 18.8 (H) 4.0 - 10.5 K/uL   RBC 3.14 (L) 4.22 - 5.81 MIL/uL   Hemoglobin 9.6 (L) 13.0 - 17.0 g/dL   HCT 21.3 (L) 08.6 - 57.8 %   MCV 89.5 80.0 - 100.0 fL   MCH 30.6 26.0 - 34.0 pg   MCHC 34.2 30.0  - 36.0 g/dL   RDW 46.9 62.9 - 52.8 %   Platelets 444 (H) 150 - 400 K/uL   nRBC 0.0 0.0 - 0.2 %   Neutrophils Relative % 85 %   Neutro Abs 15.8 (H) 1.7 - 7.7 K/uL   Lymphocytes Relative 8 %   Lymphs Abs 1.6 0.7 - 4.0 K/uL   Monocytes Relative 3 %   Monocytes Absolute 0.6 0.1 - 1.0 K/uL   Eosinophils Relative 0 %   Eosinophils Absolute 0.0 0.0 - 0.5 K/uL   Basophils Relative 0 %   Basophils Absolute 0.1 0.0 - 0.1 K/uL   Immature Granulocytes 4 %   Abs Immature Granulocytes 0.74 (H) 0.00 - 0.07 K/uL    Comment: Performed at [facility], [address], [place], [place] [address]  Comprehensive metabolic panel     Status: Abnormal   Collection Time: 02/08/21  2:34 AM  Result Value Ref Range   Sodium 128 (L) 135 - 145 mmol/L   Potassium 4.2 3.5 - 5.1 mmol/L   Chloride 92 (L) 98 - 111 mmol/L   CO2 29 22 - 32 mmol/L   Glucose, Bld 142 (H) 70 - 99 mg/dL    Comment: Glucose reference range applies only to samples taken after fasting for at least 8 hours.   BUN 7 6 - 20 mg/dL   Creatinine, Ser 4.01 (L) 0.61 - 1.24 mg/dL   Calcium 7.9 (L) 8.9 - 10.3 mg/dL   Total Protein 5.3 (L) 6.5 - 8.1 g/dL   Albumin 1.8 (L) 3.5 - 5.0 g/dL   AST 61 (H) 15 - 41 U/L   ALT 29 0 - 44 U/L   Alkaline Phosphatase 98 38 - 126 U/L   Total Bilirubin 0.7 0.3 - 1.2 mg/dL   GFR, Estimated >02 >72 mL/min    Comment: (NOTE) Calculated using the CKD-EPI Creatinine Equation (2021)    Anion  gap 7 5 - 15    Comment: Performed at [facility], [address] [place], [place] [address]  Magnesium     Status: None   Collection Time: 02/08/21  2:34 AM  Result Value Ref Range   Magnesium 2.0 1.7 - 2.4 mg/dL    Comment: Performed at [facility], [address], [place], [place] [address]  Phosphorus     Status: None   Collection Time: 02/08/21  2:34 AM  Result Value Ref Range   Phosphorus 4.5 2.5 - 4.6 mg/dL    Comment: Performed at [facility], [address] [place], [place] [address]  Glucose, capillary     Status: Abnormal   Collection Time: 02/08/21  7:47 AM  Result Value Ref Range   Glucose-Capillary 146 (H) 70 - 99 mg/dL    Comment: Glucose reference range applies only to samples taken after fasting for at least 8 hours.   Comment 1 Notify RN    Comment 2 Document in Chart   Glucose, capillary     Status: Abnormal   Collection Time: 02/08/21 12:19 PM  Result Value Ref Range   Glucose-Capillary 135 (H) 70 - 99 mg/dL    Comment: Glucose reference range applies only to samples taken after fasting for at least 8 hours.   Comment 1 Notify RN    Comment 2 Document in Chart   Glucose, capillary     Status: None   Collection Time: 02/08/21  3:37 PM  Result Value Ref Range   Glucose-Capillary 87 70 - 99 mg/dL    Comment: Glucose reference range applies only to samples taken after fasting for at least 8 hours.  Glucose, capillary     Status: Abnormal   Collection Time: 02/08/21  7:54 PM  Result Value Ref Range   Glucose-Capillary 116 (H) 70 - 99 mg/dL    Comment: Glucose reference range applies only to samples taken after fasting for at least 8 hours.  Glucose, capillary     Status: Abnormal   Collection Time: 02/08/21  9:48 PM  Result Value Ref Range   Glucose-Capillary 164 (H) 70 - 99 mg/dL    Comment: Glucose reference range applies only to samples taken after fasting for at least 8 hours.  CBC with Differential/Platelet     Status: Abnormal   Collection Time: 02/09/21  5:02 AM  Result Value Ref Range   WBC 18.5 (H) 4.0 - 10.5 K/uL   RBC 3.31 (L) 4.22 - 5.81 MIL/uL   Hemoglobin 10.2 (L) 13.0 - 17.0 g/dL   HCT 91.4 (L) 78.2 - 95.6 %   MCV 91.8 80.0 - 100.0 fL   MCH 30.8 26.0 - 34.0 pg   MCHC 33.6 30.0 - 36.0 g/dL   RDW 21.3 08.6 - 57.8 %   Platelets 460 (H) 150 - 400 K/uL   nRBC 0.0 0.0 - 0.2 %   Neutrophils Relative % 93 %   Neutro Abs 17.2 (H) 1.7 - 7.7 K/uL   Lymphocytes Relative 4 %   Lymphs Abs  0.8 0.7 - 4.0 K/uL   Monocytes Relative 1 %   Monocytes Absolute 0.2 0.1 - 1.0 K/uL   Eosinophils Relative 0 %   Eosinophils Absolute 0.0 0.0 - 0.5 K/uL   Basophils Relative 0 %   Basophils Absolute 0.0 0.0 - 0.1 K/uL   Immature Granulocytes 2 %   Abs Immature Granulocytes 0.31 (H) 0.00 - 0.07 K/uL    Comment:  Performed at [facility], [address], [place], [place] [address]  Comprehensive metabolic panel     Status: Abnormal   Collection Time: 02/09/21  5:02 AM  Result Value Ref Range   Sodium 132 (L) 135 - 145 mmol/L   Potassium 5.2 (H) 3.5 - 5.1 mmol/L    Comment: DELTA CHECK NOTED NO VISIBLE HEMOLYSIS    Chloride 96 (L) 98 - 111 mmol/L   CO2 30 22 - 32 mmol/L   Glucose, Bld 405 (H) 70 - 99 mg/dL    Comment: Glucose reference range applies only to samples taken after fasting for at least 8 hours.   BUN 13 6 - 20 mg/dL   Creatinine, Ser 8.92 0.61 - 1.24 mg/dL   Calcium 8.0 (L) 8.9 - 10.3 mg/dL   Total Protein 5.4 (L) 6.5 - 8.1 g/dL   Albumin 1.7 (L) 3.5 - 5.0 g/dL   AST 35 15 - 41 U/L   ALT 25 0 - 44 U/L   Alkaline Phosphatase 95 38 - 126 U/L   Total Bilirubin 0.7 0.3 - 1.2 mg/dL   GFR, Estimated >11 >94 mL/min    Comment: (NOTE) Calculated using the CKD-EPI Creatinine Equation (2021)    Anion gap 6 5 - 15    Comment: Performed at [facility], [address] [place], [place] [address]  Phosphorus     Status: Abnormal   Collection Time: 02/09/21  5:02 AM  Result Value Ref Range   Phosphorus 5.0 (H) 2.5 - 4.6 mg/dL    Comment: Performed at [facility], [address] [place], [place] [address]  Magnesium     Status: None   Collection Time: 02/09/21  5:02 AM  Result Value Ref Range   Magnesium 1.9 1.7 - 2.4 mg/dL    Comment: Performed at [facility], [address] [place], [place] [address]  Glucose, capillary     Status: Abnormal   Collection Time: 02/09/21  8:20 AM  Result Value  Ref Range   Glucose-Capillary 381 (H) 70 - 99 mg/dL    Comment: Glucose reference range applies only to samples taken after fasting for at least 8 hours.   DG Knee 1-2 Views Right  Result Date: 02/08/2021 CLINICAL DATA:  Right knee pain EXAM: RIGHT KNEE - 2 VIEW COMPARISON:  None. FINDINGS: Medial joint space narrowing is noted with mild osteophytic change. Mild patellofemoral changes are noted as well. No joint effusion is seen. No acute fracture is noted. IMPRESSION: Mild degenerative change without acute abnormality. Electronically Signed   By: Alcide Clever M.D.   On: 02/08/2021 09:30   CT CHEST WO CONTRAST  Result Date: 02/09/2021 CLINICAL DATA:  LEFT posterior shoulder/axillary abscess post I & D, leukocytosis EXAM: CT CHEST WITHOUT CONTRAST TECHNIQUE: Multidetector CT imaging of the chest was performed following the standard protocol without IV contrast. Sagittal and coronal MPR images reconstructed from axial data set. COMPARISON:  CT chest 02/01/2021 FINDINGS: Cardiovascular: Aorta normal caliber. Heart size normal. No pericardial effusion. Mediastinum/Nodes: Esophagus unremarkable. Base of cervical region normal appearance. Enlarged LEFT axillary lymph nodes identified. No additional intrathoracic adenopathy. Lungs/Pleura: Small BILATERAL pleural effusions with compressive atelectasis of the lower lobes. LEFT lower lobe nodule 10 mm diameter unchanged. Few calcified granulomata bilaterally. Mild central peribronchial thickening. No infiltrate or pneumothorax. Additional lingular nodule 5 mm diameter image 85 unchanged. Subpleural nodule anterior LEFT upper lobe 6 mm diameter image 66 unchanged. Upper Abdomen: Within limitations of  beam hardening artifacts from arms, upper abdomen unremarkable Musculoskeletal: Degenerative disc disease changes thoracic spine. Osseous structures intact. Complex collection LEFT axilla and chest wall, extends from anterior to posterior from above the LEFT apex through  the inferior extent of the exam at the lateral upper abdomen. Eight collection contains gas and fluid consistent with abscess. Foci of fluid extend subscapular, superior to the LEFT first and second ribs, and anteriorly anterior to the glenohumeral joint and coracoid process. The portion of the collection subscapular is similar to the previous exam, 10.4 x 5.8 cm image 11. The lateral chest wall collection has markedly increased and now measures 17 cm AP, 6.3 cm transverse, and at least 16.2 cm craniocaudal. Foci of gas are identified retropectoral and RIGHT deep to the deltoid muscle. Gas is seen extending adjacent to the coracoid process. No definite osseous destruction. IMPRESSION: Marked increase in size of abscess collection at the LEFT lateral chest wall with persistent abscess again seen subscapular extending cranial to the apex of the LEFT hemithorax as described above. Since patient is post I & D of the chest wall abscess, it is uncertain how much of the increase in size of the abscess is due to postsurgical changes versus progression of abscess collection Small BILATERAL pleural effusions and bibasilar atelectasis. Reactive LEFT axillary adenopathy. Electronically Signed   By: Ulyses SouthwardMark  Boles M.D.   On: 02/09/2021 08:55   CT SHOULDER LEFT WO CONTRAST  Result Date: 02/09/2021 CLINICAL DATA:  Patient status post left shoulder, arthrotomy, irrigation and debridement of the joint. Biceps tenotomy and excision of necrotic rotator cuff was also performed 02/08/2021. The patient has infection may extend into the right chest. EXAM: CT OF THE UPPER LEFT EXTREMITY WITHOUT CONTRAST TECHNIQUE: Multidetector CT imaging of the upper left extremity was performed according to the standard protocol. COMPARISON:  MRI right shoulder 02/07/2021. FINDINGS: Bones/Joint/Cartilage CT is not sensitive for the detection of osteomyelitis. No bony destructive change or periosteal reaction is identified. The patient has moderate  acromioclavicular osteoarthritis. There appears to be a small glenohumeral joint effusion. No fracture or dislocation. Ligaments Suboptimally assessed by CT. Muscles and Tendons Gas and fluid are seen in multiple muscles about the shoulder. Discrete measurement collections is difficult but a gas and fluid within and superior to the supraspinatus muscle measures approximately 6.8 cm transverse by 3.5 cm AP by 1.7 cm craniocaudal. Gas and fluid are also seen surrounding the scapula and extending into the intercostal spaces between the second and third ribs as seen on image 24 series 7 and the fourth and fifth ribs as seen on image 59 of series 7. Soft tissues As above. IMPRESSION: Extensive abscess in the left chest wall and intercostal spaces most consistent with multifocal pyomyositis as described above. Flocculent gas in musculature about the left shoulder is worrisome for residual abscess although recent surgery complicates evaluation. CT is not sensitive for the detection of osteomyelitis but no evidence of osteomyelitis is seen. These results were called by telephone at the time of interpretation on 02/09/2021 at 9:17 am to provider [facility] , who verbally acknowledged these results. Electronically Signed   By: Drusilla Kannerhomas  Dalessio M.D.   On: 02/09/2021 09:20      Assessment/Plan Left lateral chest wall abscess The patient's imaging has been reviewed and he appears to have a large 17x16x6cm fluid collection in the left lateral chest wall.  It does not appear to have any communication to the actual thorax cavity.  This will need to be drained.  He is returning to the OR tomorrow to address his right shoulder with Dr. Dion Saucier.  Will discuss if we can do a combination case with him to avoid anesthesia 3 days in a row for this gentleman.  The surgery itself as well as the possible need for multiple trips back to the operating room have been discussed with the patient and he understands and agrees.   FEN -  regular diet, likely NPO p MN for OR tomorrow  VTE - Lovenox ID - Ancef  MSSA bacteremia Endocarditis DM HTN  Chronic back pain Left shoulder abscess, s/p debridement  Letha Cape, [facility] 02/09/2021, 10:58 AM Please see Amion for pager number during day hours 7:00am-4:30pm or 7:00am -11:30am on weekends   Addendum: I have seen and examined the patient and agree with the assessment and plans.  Since Ortho is returning to the OR tomorrow, will plan on incision and drainage of the large left lateral chest wall abscess at the same time by one of my partners.  The patient agrees with the plan.  Abigail Miyamoto, MD  Christell Constant. Magnus Ivan  MD, FACS

## 2021-02-09 NOTE — Progress Notes (Signed)
CT scans reviewed.   Consulted general surgery for left chest wall abscess, with gas on CT, official read not back yet, but it looks to me like it may be communicated through his rib cage from  his lung.  I'm not sure if he needs general surgery or thoracic surgery, but may need further debridement in this region. Appreciate their help.    Eulas Post, MD

## 2021-02-09 NOTE — Progress Notes (Signed)
Physical Therapy Treatment Patient Details Name: John Ware MRN: [id number] DOB: [birth year] Today's Date: 02/09/2021    History of Present Illness Patient is a 55 y.o. male presented to ED with weakness and per wife generally unwell over the past 3 weeks. He had a positive home COVID test on 01/12/21. He had no respiratory symptoms at the time. He just had fevers that were responsive to APAP. He self isolated at home until 01/20/21 when he was at a party and experienced a fall. bil shoulder and spien xrays negative.  MRI brain=Punctate acute or subacute infarct versus artifact in the high left  frontal lobe. Otherwise, no evidence of recent infarction.  Underwent bilateral MRIs of his shoulders and he is still having fevers.  At the left shoulder was concerned for septic arthritis and left axillary abscess.  Orthopedics was reconsulted as well as IR.  Pt s/p  Left shoulder excisional debridement, deep abscess, with arthrotomy and irrigation and debridement of the joint with biceps tenotomy and excision of necrotic rotator cuff on 02/08/21.   PMH: significant of chronic pain, HTN, DM2, depression, spinal stenosis.    PT Comments    Pt assisted with ambulating in hallway.  Pt currently in sling for comfort and may require multiple left shoulder I&Ds.  RN aware of request for [facility] clarification as pt kept L UE NWB today.  Pt very unsteady with gait, fatigues quickly and reports generalized pain.  Continue to recommend post acute rehab upon d/c.    Follow Up Recommendations  CIR (CIR if appealing ins denial, SNF if not CIR)     Equipment Recommendations  Other (comment) (may need RW if able to WB on Lt UE, awaiting clarification)    Recommendations for Other Services       Precautions / Restrictions Precautions Precautions: Fall Precaution Comments: L UE sling for comfort per order, appropriately adjusted sling prior to mobilizing Required Braces or Orthoses: Sling Restrictions Other  Position/Activity Restrictions: RN to check on L UE weight bearing as pt likely to have multiple surgeries per notes    Mobility  Bed Mobility               General bed mobility comments: pt in recliner    Transfers Overall transfer level: Needs assistance Equipment used: Rolling walker (2 wheeled) Transfers: Sit to/from Stand Sit to Stand: Min assist         General transfer comment: verbal cues for hand placement and weight shifting, Lt UE remained in sling however pt does attempt at times to use, assist to control descent  Ambulation/Gait Ambulation/Gait assistance: Min assist Gait Distance (Feet): 120 Feet Assistive device: Rolling walker (2 wheeled) Gait Pattern/deviations: Step-through pattern;Decreased stride length;Wide base of support;Decreased stance time - right Gait velocity: decr   General Gait Details: antalgic gait observed, pt reporst right knee pain and edematous foot likely contributing to antalgic pattern, therapist negotiated RW on left side as pt in sling, assist for stability; pt required 4 standing rest breaks due to fatigue and pain   Stairs             Wheelchair Mobility    Modified Rankin (Stroke Patients Only)       Balance Overall balance assessment: Needs assistance         Standing balance support: No upper extremity supported Standing balance-[name]: Poor Standing balance comment: requires assist to steady if no UE support, reliant on UE  Cognition Arousal/Alertness: Awake/alert Behavior During Therapy: WFL for tasks assessed/performed Overall Cognitive Status: Impaired/Different from baseline Area of Impairment: Memory;Following commands;Problem solving                     Memory: Decreased recall of precautions;Decreased short-term memory Following Commands: Follows one step commands with increased time;Follows multi-step commands inconsistently     Problem  Solving: Slow processing;Requires verbal cues;Difficulty sequencing General Comments: easily distracted and requires frequent redirection to task      Exercises      General Comments General comments (skin integrity, edema, etc.): Pt with more edematous Rt LE then Lt.  RN into room end of session and agreeable to place pillows for LE elevation and Lt shoulder support (in sling).      Pertinent Vitals/Pain Pain Assessment: Faces Faces Pain Scale: Hurts even more Pain Location: L shoulder, R knee, back specifically however pt reports pain "all over" Pain Descriptors / Indicators: Guarding;Discomfort;Sore Pain Intervention(s): Repositioned;Monitored during session;RN gave pain meds during session    Home Living                      Prior Function            PT Goals (current goals can now be found in the care plan section) Acute Rehab PT Goals PT Goal Formulation: With patient Time For Goal Achievement: 02/23/21 Potential to Achieve Goals: Good Progress towards PT goals: Progressing toward goals    Frequency    Min 3X/week      PT Plan Current plan remains appropriate    Co-evaluation              AM-PAC PT "6 Clicks" Mobility   Outcome Measure  Help needed turning from your back to your side while in a flat bed without using bedrails?: A Lot Help needed moving from lying on your back to sitting on the side of a flat bed without using bedrails?: A Lot Help needed moving to and from a bed to a chair (including a wheelchair)?: A Lot Help needed standing up from a chair using your arms (e.g., wheelchair or bedside chair)?: A Lot Help needed to walk in hospital room?: A Lot Help needed climbing 3-5 steps with a railing? : Total 6 Click Score: 11    End of Session Equipment Utilized During Treatment: Gait belt Activity Tolerance: Patient limited by pain;Patient limited by fatigue Patient left: in chair;with call bell/phone within reach;with chair alarm  set;with nursing/sitter in room Nurse Communication: Mobility status PT Visit Diagnosis: Muscle weakness (generalized) (M62.81);Unsteadiness on feet (R26.81);History of falling (Z91.81);Difficulty in walking, not elsewhere classified (R26.2)     Time: 8841-6606 PT Time Calculation (min) (ACUTE ONLY): 20 min  Charges:  $Gait Training: 8-22 mins                    Paulino Door, DPT Acute Rehabilitation Services Pager: [phone] Office: [phone]  Maida Sale E 02/09/2021, 12:59 PM

## 2021-02-09 NOTE — Progress Notes (Signed)
Inpatient Diabetes Program Recommendations  AACE/ADA: New Consensus Statement on Inpatient Glycemic Control (2015)  Target Ranges:  Prepandial:   less than 140 mg/dL      Peak postprandial:   less than 180 mg/dL (1-2 hours)      Critically ill patients:  140 - 180 mg/dL   Lab Results  Component Value Date   GLUCAP 381 (H) 02/09/2021   HGBA1C 10.2 (H) 01/31/2021    Review of Glycemic Control Results for John Ware, John Ware (MRN [id number]) as of 02/09/2021 10:49  Ref. Range 02/08/2021 12:19 02/08/2021 15:37 02/08/2021 19:54 02/08/2021 21:48 02/09/2021 08:20  Glucose-Capillary Latest Ref Range: 70 - 99 mg/dL 751 (H) 87 025 (H) 852 (H) 381 (H)    Diabetes history: DM 2 Outpatient Diabetes medications: unverified Current orders for Inpatient glycemic control:  Lantus 15 units Daily Novolog 0-15 units tid + hs  Inpatient Diabetes Program Recommendations:    Note: Decadron 10 mg given pt pt last night. Trends should decrease in the next 24-48 hours.  -  consider once time dose of Lantus 10 units in addition to current dose.  D/c:  Pt to possibly benefit from insulin at time of d/c. Pt to d/c to possibly SNF.  Levemir preferred by insurance    Thanks,  Christena Deem RN, MSN, BC-ADM Inpatient Diabetes Coordinator Team Pager [phone] (8a-5p)

## 2021-02-09 NOTE — Progress Notes (Signed)
Pt care assumed at 1500 this shift. I have reviewed the previous RN's assessment and agree with their findings. Will continue to monitor.

## 2021-02-09 NOTE — Progress Notes (Signed)
PROGRESS NOTE    John Ware  EAV:[id number] DOB: [birth year] DOA: 01/31/2021 PCP: Sharion Balloon, FNP   Brief Narrative:  Patient is a 55 year old obese Caucasian male with past medical history significant for but not limited to chronic pain, hypertension, diabetes mellitus type 2 as well as other comorbidities who presented with weakness.  History is obtained from the wife as he is a poor historian but she reported that he had been generally unwell for the past 3 weeks or so.  He had at home positive COVID test on 01/12/2021.  He had no respiratory symptoms at this time but he did have fevers that was responsive to Tylenol.  Reportedly he apparently fell out of his chair and hurt his left shoulder and he was evaluated by the ED and they found no fracture and he was given a sling and told to follow-up with orthopedics.  Since that time the patient's wife reports that he has had general body aches and pain and has increased symptoms that are not responsive to his chronic pain meds.  He is also had poor appetite during this time and has been unable to walk for last few days.  Because of these constantly some symptoms he is brought to the ED for further evaluation.  In the ED is found to have a sodium of 113 and a glucose of 339 and a WBC of 20.5 as well as a lactic acid of 3.8.  He started on broad-spectrum antibiotics and given fluids.  Soon after his blood cultures became positive for 4-4 MSSA.  He was transitioned to IV Ancef.  With further work-up and confirmation he was also found to have a large fluid collection of left lateral posterior chest and back.  He did have tenderness in his back.  He underwent MRI T-spine and CT of the chest abdomen pelvis.  There is no evidence of any osteomyelitis or discitis or other underlying infection on MRI T-spine.  CT of the chest did show a large multiloculated fluid collection.  IR was consulted and he underwent an ultrasound-guided aspiration was sent for  culturing.  CT was concerning for evidence of septic emboli but 2D echo did not reveal any evidence of an endocarditis.  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  Subsequently he still complained of some shoulder pain so he underwent bilateral MRIs of his shoulders and he is still having fevers.  At the left shoulder was concerned for septic arthritis and left axillary abscess.  Orthopedics was reconsulted as well as IR and the orthopedic surgeon is planning to do a washout tomorrow in edition IR is going to place a drain on the residual fluid collection that may exist after washout.  Because of the surgery ID changed the patient to nafcillin however recommending continuing cefazolin at this time. ESR and CRP and both are elevated.  Patient to go for Orthopedic Intervention was done yesterday evening as he had a left shoulder excisional debridement, deep abscess with arthrotomy and irrigation debridement of the joint with biceps tenotomy and excision of the necrotic rotator cuff.  Patient is not medically ready to be D/C'd to CIR.   Postoperatively he had a CT scan and repeated and showed a marked increase in size of the abscess collection at the left lateral chest wall with persistent abscess again seen in the subscapular extending cranial to the apex of  the left hemithorax.  Since he is post I&D it was unclear about how much of the increase in size of the abscess was due to the postsurgical changes progression of the abscess collection.  Patient did have some small bilateral pleural effusions and bibasilar atelectasis as well as reactive left axillary adenopathy.  Post surgery patient was given Decadron 10 mg last night.  Because of the changes on his repeat CT scan with a left chest wall abscess with gas on CT General surgery was consulted and they are planning on taking the patient back  to the OR to address his right shoulder along with a large left lateral chest wall abscess.  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 This would be start date of abx likely. Final rec's per ID for length of duration of Ancef, likely 6 weeks; he would be a candidate for a PICC and if accepted to CIR they could continue abx there if approved but he is not medically stable yet    Bacteremia due to Staphylococcus aureus - 4/4 blood cultures on admission growing MSSA -Repeated blood cultures on 02/01/2021; NGTD - TTE negative for vegetations; cardiology consulted for TEE, 02/05/21: POSITIVE for TV vegetation - now s/p IR drainage of left posterior chest wall fluid collection which was noted to be purulent in nature; culture also growing MSSA - MRI T spine 02/01/21 negative for OM/discitis  - MRI brain 6/12 negative for pathology in visual cortex to explain "floaters" in visual fields - continue ancef for now  - ID following and appreciate recc's recommending continuing cefazolin for now   Hyponatremia - Initially presumed due to dehydration and hypokalemia - severely low on admission, 113 -Improved now and is trending up to 132 - will check tsh, repeat osm and urine osm,  urine sodium. Cortisol wnl when checked earlier this admission, will repeat tomorrow with acth stim test. Reset osmostat possible - may need fluid restriction   Bilateral  shoulder pain - See work-up above - Was previously seen in the ER after his fall and recommended for a sling with outpatient Ortho follow-up -Bilateral shoulder x-rays unremarkable for now.   - MRI of shoulders to be performed today to eval for abscess/osteo; Left Shoulder MRI showed "Findings concerning for glenohumeral and acromioclavicular septic arthritis with septic subacromial/subdeltoid and  subcoracoid bursitis. No osteomyelitis. Large abscess in the left axilla, incompletely visualized. Full-thickness, partial width tear of the anterior supraspinatus tendon with 2.2 cm retraction to the top of  the humeral head. Additional high-grade partial-thickness articular surface tear of the posterior  supraspinatus tendon. High-grade partial-thickness articular surface tear of the superior subscapularis  tendon. complete tear of the biceps long head tendon within the bicipital groove." -  Right should MRI done showed "No convincing evidence of septic arthritis. Moderate amount of fluid in  the subacromial/subdeltoid bursa and subcoracoid bursa is favored related to underlying rotator cuff  pathology described below rather than septic bursitis, although fluid sampling could be obtained for  definitive diagnosis. Full-thickness, essentially full width tear of the supraspinatus tendon with 2.1 cm retraction to the top of the humeral head. No muscle atrophy. High-grade partial-thickness articular  surface tear of the mid to distal infraspinatus tendon. Partially torn long head biceps tendon within the  bicipital groove. Moderate acromioclavicular and mild glenohumeral osteoarthritis." -Orthopedic surgery evaluated and took the patient to the OR last night and addressed the left shoulder as he had "Left shoulder excisional debridement, deep  abscess, with arthrotomy and irrigation and debridement of the joint with biceps tenotomy and excision of necrotic rotator cuff" -Right shoulder will be addressed tomorrow  Left Lateral Chest Wall Abscess -CT Scan showed "Marked increase in size of abscess collection at the LEFT lateral chest wall with persistent abscess again seen subscapular extending cranial to the apex of the LEFT hemithorax as described above. Since patient is post I & D of the chest wall abscess, it is uncertain how much of the increase in size of the abscess is due to postsurgical changes versus progression of abscess collection   Small BILATERAL pleural effusions and bibasilar atelectasis. Reactive LEFT axillary adenopathy." -General surgery evaluated and patient does have a large 17 x 16 x 6 cm fluid collection in the left lateral chest wall that does not have any communication to the actual thorax cavity.  General surgery feel this needs to be drained and since he is going back to the OR tomorrow for his right shoulder they are going to be doing a combination case with orthopedic surgery.  The surgery itself as well as other possible need for multiple chest operating been discussed the patient and he understands agrees the plan is for n.p.o. after midnight and drainage of the large left lateral chest wall abscess    Vision changes - Patient now stating on 02/04/2021 that he has been seeing "black floaters" in his field of vision for several days.  When questioned why he did not bring this up sooner, he did not have a good answer and did not want to "trouble [place]" - ophtho evaluated 6/13, no endophthalmitis   DKA (diabetic ketoacidosis) (HCC)-resolved as of 02/02/2021 - present on admission - responded to fluids and insulin - now resolved -Lantus 15 units subcu daily as well as moderate NovoLog sliding scale insulin before meals and at bedtime and will add 3 units of NovoLog 3 times daily if the patient eats greater than 50% of  his meals -He is given IV Decadron once last night his blood sugars went up so we will need to continue monitor carefully as CBGs are now ranging from 116-447   Ascending aorta dilatation (HCC) - TTE on 6/10 notes mild dilation of ascending aorta, 38 mm - follow TEE results as well: normal thoracic and ascending aorta noted on 02/05/21  Hyperkalemia -Patient was given a dose of Lokelma -Patient's potassium this morning was 5.2  -Continue to monitor and trend and repeat CMP in the a.m.   Anxiety - Wellbutrin and Zoloft on hold for now in setting of hyponatremia   HTN (hypertension) - Pressure normal on admission.  Will treat if needed - Continue to Monitor BP per Protocol -Last BP was 110/67    Diabetes mellitus ([place]) -  Continue SSI and CBG monitoring -C/w Lantus 15 units subcu daily as well as moderate NovoLog sliding scale insulin before meals and at bedtime and will add 3 units of NovoLog 3 times daily if the patient eats greater than 50% of his meals - A1c 10.2% on 01/31/2021 -CBGs ranging from 785-885   Obesity -Complicates overall prognosis and care -Estimated body mass index is 30.78 kg/m as calculated from the following:   Height as of this encounter: _0  (1.778 m).   Weight as of this encounter: 97.3 kg. -Weight Loss and Dietary Counseling given   Right Knee Pain -Obtained X-Ray and showed "Medial joint space narrowing is noted with mild osteophytic change. Mild patellofemoral changes are noted as well. No joint effusion is seen. No acute fracture is noted."  Normocytic Anemia -Relatively Stable -Patient's Hgb/Hct went from 10.5/29.9 -> 10.8/31.0 -> 10.1/28.7 -> 9.6/28.1 -> 10.2/30.4 -Check Anemia Panel in the AM  -Continue to Monitor for S/Sx of Bleeding  -Repeat CBC in the AM   Thrombocytosis -Likely Reactive -Patient's Platelet Count went from 397 -> 414 -> 427 -> 444 -> 460 -Continue to Monitor and Trend -Repeat CBC in the AM  DVT prophylaxis: Enoxaparin  40 mg subcu every 24 Code Status: FULL CODE  Family Communication: No family present at bedside  Disposition Plan: Pending further work-up and clearance by specialists; Will transfer to Medical Floor  Status is: Inpatient  Remains inpatient appropriate because:Unsafe d/c plan, IV treatments appropriate due to intensity of illness or inability to take PO, and Inpatient level of care appropriate due to severity of illness  Dispo: The patient is from: Home              Anticipated d/c is to: CIR vs SNF              Patient currently is not medically stable to d/c.   Difficult to place patient No  Consultants:  Infectious Diseases Orthopedic Surgery Interventional Radiology  CIR Cardiology  Procedures:  TEE Results: Normal LV size and function Normal RV size and function Normal RA Normal LA and LA appendage Abnormal TV with large filamentous highly mobile density predominantly on the ventricular side of the TV leaflets consistent with vegetation.  There is mild associated TR. Normal PV Normal MV with trivial MR Normal trileaflet AV with trivial AR Normal interatrial septum with no evidence of shunt by colorflow dopper Normal thoracic and ascending aorta.    Antimicrobials:  Anti-infectives (From admission, onward)    Start     Dose/Rate Route Frequency Ordered Stop   02/01/21 0600  ceFAZolin (ANCEF) IVPB 2g/100 mL premix        2 g 200 mL/hr over 30 Minutes Intravenous Every 8 hours 02/01/21 0414     01/31/21 1800  cefTRIAXone (ROCEPHIN) 1 g in sodium chloride 0.9 % 100 mL IVPB  Status:  Discontinued        1 g 200 mL/hr over 30 Minutes Intravenous Every 24 hours 01/31/21 1453 02/01/21 0425   01/31/21 1115  vancomycin (VANCOREADY) IVPB 2000 mg/400 mL        2,000 mg 200 mL/hr over 120 Minutes Intravenous  Once 01/31/21 1105 01/31/21 1320   01/31/21 1045  ceFEPIme (MAXIPIME) 2 g in sodium chloride 0.9 % 100 mL IVPB        2 g 200 mL/hr over 30 Minutes Intravenous  Once  01/31/21 1035 01/31/21 1129   01/31/21 1045  metroNIDAZOLE (FLAGYL) IVPB 500 mg  500 mg 100 mL/hr over 60 Minutes Intravenous  Once 01/31/21 1035 01/31/21 1208   01/31/21 1045  vancomycin (VANCOCIN) IVPB 1000 mg/200 mL premix  Status:  Discontinued        1,000 mg 200 mL/hr over 60 Minutes Intravenous  Once 01/31/21 1035 01/31/21 1105        Subjective: Seen and examined and he was doing okay.  States his left shoulder feels much better now.  No nausea or vomiting.  Denies any lightheadedness or dizziness.  Understands that he needs his left lateral wall chest abscess drained and he will be going back to the OR.  No other concerns or complaints at this time.  Objective: Vitals:   02/09/21 0524 02/09/21 0629 02/09/21 0824 02/09/21 1346  BP: 114/81 98/73 110/69 111/76  Pulse: 97 98 99 81  Resp: (!) _0 Temp: (!) 97.4 F (36.3 C)  (!) 97.4 F (36.3 C) (!) 97.5 F (36.4 C)  TempSrc: Oral  Oral Oral  SpO2: 100% 94% 90% 94%  Weight:      Height:        Intake/Output Summary (Last 24 hours) at 02/09/2021 1936 Last data filed at 02/09/2021 1600 Gross per 24 hour  Intake 100 ml  Output 750 ml  Net -650 ml    Filed Weights   01/31/21 1400 02/05/21 0500 02/08/21 1540  Weight: 89.9 kg 97.3 kg 97.3 kg   Examination: Physical Exam:  Constitutional: WN/WD obese Caucasian male in NAD Eyes: Lids and conjunctivae normal, sclerae anicteric  ENMT: External Ears, Nose appear normal. Grossly normal hearing.  Neck: Appears normal, supple, no cervical masses, normal ROM, no appreciable thyromegaly; no jVD Respiratory: Diminished to auscultation bilaterally, no wheezing, rales, rhonchi or crackles. Normal respiratory effort and patient is not tachypenic. No accessory muscle use. Unlabored breathing  Cardiovascular: RRR, no murmurs / rubs / gallops. S1 and S2 auscultated. No extremity edema. 1+ LE Edema Abdomen: Soft, non-tender, non-distended. Bowel sounds positive.  GU:  Deferred. Musculoskeletal: No clubbing / cyanosis of digits/nails. No joint deformity upper and lower extremities.  Skin: No rashes, lesions, ulcers on a limited skin evaluation. No induration; Warm and dry.  Neurologic: CN 2-12 grossly intact with no focal deficits. Romberg sign and cerebellar reflexes not assessed.  Psychiatric: Normal judgment and insight. Alert and oriented x 3. Normal mood and appropriate affect.   Data Reviewed: I have personally reviewed following labs and imaging studies  CBC: Recent Labs  Lab 02/06/21 0239 02/06/21 1653 02/07/21 0805 02/08/21 0234 02/09/21 0502  WBC 22.3* 20.2* 21.4* 18.8* 18.5*  NEUTROABS 18.1* 17.4* 18.4* 15.8* 17.2*  HGB 10.5* 10.8* 10.1* 9.6* 10.2*  HCT 29.9* 31.0* 28.7* 28.1* 30.4*  MCV 86.4 88.3 87.5 89.5 91.8  PLT 397 414* 427* 444* 460*    Basic Metabolic Panel: Recent Labs  Lab 02/05/21 0558 02/05/21 1648 02/06/21 0239 02/07/21 0236 02/07/21 0805 02/08/21 0234 02/09/21 0502  NA 127* 125* 125* 125*  --  128* 132*  K 3.9 4.1 3.8 3.9  --  4.2 5.2*  CL 91* 90* 91* 91*  --  92* 96*  CO2 _1 --  29 30  GLUCOSE 174* 246* 176* 113*  --  142* 405*  BUN _2 --  7 13  CREATININE 0.54* 0.54* 0.47* 0.43*  --  0.44* 0.69  CALCIUM 7.6* 7.6* 7.6* 7.7*  --  7.9* 8.0*  MG 2.1  --  2.1  --  2.1 2.0 1.9  PHOS 3.3  --  4.2  --  4.4 4.5 5.0*    GFR: Estimated Creatinine Clearance: 124.9 mL/min (by C-G formula based on SCr of 0.69 mg/dL). Liver Function Tests: Recent Labs  Lab 02/04/21 0300 02/08/21 0234 02/09/21 0502  AST 43* 61* 35  ALT _0 ALKPHOS 95 98 95  BILITOT 1.1 0.7 0.7  PROT 5.1* 5.3* 5.4*  ALBUMIN 1.6* 1.8* 1.7*    No results for input(s): LIPASE, AMYLASE in the last 168 hours. No results for input(s): AMMONIA in the last 168 hours. Coagulation Profile: No results for input(s): INR, PROTIME in the last 168 hours.  Cardiac Enzymes: No results for input(s): CKTOTAL, CKMB, CKMBINDEX,  TROPONINI in the last 168 hours. BNP (last 3 results) No results for input(s): PROBNP in the last 8760 hours. HbA1C: No results for input(s): HGBA1C in the last 72 hours. CBG: Recent Labs  Lab 02/08/21 1954 02/08/21 2148 02/09/21 0820 02/09/21 1155 02/09/21 1641  GLUCAP 116* 164* 381* 447* 294*    Lipid Profile: No results for input(s): CHOL, HDL, LDLCALC, TRIG, CHOLHDL, LDLDIRECT in the last 72 hours. Thyroid Function Tests: No results for input(s): TSH, T4TOTAL, FREET4, T3FREE, THYROIDAB in the last 72 hours.  Anemia Panel: No results for input(s): VITAMINB12, FOLATE, FERRITIN, TIBC, IRON, RETICCTPCT in the last 72 hours. Sepsis Labs: Recent Labs  Lab 02/04/21 0739  LATICACIDVEN 1.5     Recent Results (from the past 240 hour(s))  Urine culture     Status: Abnormal   Collection Time: 01/31/21  8:51 AM   Specimen: In/Out Cath Urine  Result Value Ref Range Status   Specimen Description   Final    IN/OUT CATH URINE Performed at [facility], [place] [address], [name], [place] [address]    Special Requests   Final    NONE Performed at [facility], [place] [address], [place], [place] [address]    Culture >=100,000 COLONIES/mL STAPHYLOCOCCUS AUREUS (A)  Final   Report Status 02/02/2021 FINAL  Final   Organism ID, Bacteria STAPHYLOCOCCUS AUREUS (A)  Final      Susceptibility   Staphylococcus aureus - MIC*    CIPROFLOXACIN >=8 RESISTANT Resistant     GENTAMICIN <=0.5 SENSITIVE Sensitive     NITROFURANTOIN <=16 SENSITIVE Sensitive     OXACILLIN 0.5 SENSITIVE Sensitive     TETRACYCLINE <=1 SENSITIVE Sensitive     VANCOMYCIN 1 SENSITIVE Sensitive     TRIMETH/SULFA <=10 SENSITIVE Sensitive     CLINDAMYCIN <=0.25 SENSITIVE Sensitive     RIFAMPIN <=0.5 SENSITIVE Sensitive     Inducible Clindamycin NEGATIVE Sensitive     * >=100,000 COLONIES/mL STAPHYLOCOCCUS AUREUS  Resp Panel by RT-PCR (Flu A&B, Covid) Nasopharyngeal Swab     Status:  Abnormal   Collection Time: 01/31/21 10:59 AM   Specimen: Nasopharyngeal Swab; Nasopharyngeal(NP) swabs in vial transport medium  Result Value Ref Range Status   SARS Coronavirus 2 by RT PCR POSITIVE (A) NEGATIVE Final    Comment: RESULT CALLED TO, READ BACK BY AND VERIFIED WITH: GARRISON,G. RN AT 1206 01/31/21 MULLINS,T (NOTE) SARS-CoV-2 target nucleic acids are DETECTED.  The SARS-CoV-2 RNA is generally detectable in upper respiratory specimens during the acute phase of infection. Positive results are indicative of the presence of the identified virus, but do not rule out bacterial infection or co-infection with other pathogens not detected by the test. Clinical correlation with patient history and other diagnostic information is necessary to  determine patient infection status. The expected result is Negative.  Fact Sheet for Patients: EntrepreneurPulse.com.au  Fact Sheet for Healthcare Providers: IncredibleEmployment.be  This test is not yet approved or cleared by the [place] FDA and  has been authorized for detection and/or diagnosis of SARS-CoV-2 by FDA under an Emergency Use Authorization (EUA).  This EUA will remain in effect (meaning this tes t can be used) for the duration of  the COVID-19 declaration under Section 564(b)(1) of the Act, 21 U.S.C. section 360bbb-3(b)(1), unless the authorization is terminated or revoked sooner.     Influenza A by PCR NEGATIVE NEGATIVE Final   Influenza B by PCR NEGATIVE NEGATIVE Final    Comment: (NOTE) The Xpert Xpress SARS-CoV-2/FLU/RSV plus assay is intended as an aid in the diagnosis of influenza from Nasopharyngeal swab specimens and should not be used as a sole basis for treatment. Nasal washings and aspirates are unacceptable for Xpert Xpress SARS-CoV-2/FLU/RSV testing.  Fact Sheet for Patients: EntrepreneurPulse.com.au  Fact Sheet for Healthcare  Providers: IncredibleEmployment.be  This test is not yet approved or cleared by the [place] FDA and has been authorized for detection and/or diagnosis of SARS-CoV-2 by FDA under an Emergency Use Authorization (EUA). This EUA will remain in effect (meaning this test can be used) for the duration of the COVID-19 declaration under Section 564(b)(1) of the Act, 21 U.S.C. section 360bbb-3(b)(1), unless the authorization is terminated or revoked.  Performed at [facility], [place] [address] [place], [place] [address]   Blood Culture (routine x 2)     Status: Abnormal   Collection Time: 01/31/21 10:59 AM   Specimen: BLOOD  Result Value Ref Range Status   Specimen Description   Final    BLOOD LEFT ANTECUBITAL Performed at [place] [address] [place], [place] [address]    Special Requests   Final    BOTTLES DRAWN AEROBIC AND ANAEROBIC Blood Culture adequate volume Performed at [place] [address] [place], [place] [address]    Culture  Setup Time   Final    GRAM POSITIVE COCCI IN CLUSTERS IN BOTH AEROBIC AND ANAEROBIC BOTTLES CRITICAL RESULT CALLED TO, READ BACK BY AND VERIFIED WITH: [place] L. [id number] FCP Performed at [facility], [place] [address], [place], [place] [address]    Culture STAPHYLOCOCCUS AUREUS (A)  Final   Report Status 02/04/2021 FINAL  Final   Organism ID, Bacteria STAPHYLOCOCCUS AUREUS  Final      Susceptibility   Staphylococcus aureus - MIC*    CIPROFLOXACIN >=8 RESISTANT Resistant     ERYTHROMYCIN >=8 RESISTANT Resistant     GENTAMICIN <=0.5 SENSITIVE Sensitive     OXACILLIN 0.5 SENSITIVE Sensitive     TETRACYCLINE <=1 SENSITIVE Sensitive     VANCOMYCIN <=0.5 SENSITIVE Sensitive     TRIMETH/SULFA <=10 SENSITIVE Sensitive     CLINDAMYCIN <=0.25 SENSITIVE Sensitive     RIFAMPIN <=0.5 SENSITIVE Sensitive     Inducible Clindamycin NEGATIVE Sensitive      * STAPHYLOCOCCUS AUREUS  Blood Culture ID Panel (Reflexed)     Status: Abnormal   Collection Time: 01/31/21 10:59 AM  Result Value Ref Range Status   Enterococcus faecalis NOT DETECTED NOT DETECTED Final   Enterococcus Faecium NOT DETECTED NOT DETECTED Final   Listeria monocytogenes NOT DETECTED NOT DETECTED Final   Staphylococcus species DETECTED (A) NOT DETECTED Final    Comment: CRITICAL RESULT CALLED TO, READ BACK BY AND VERIFIED WITH:  PHARMD MICHELLE L. [id number] FCP    Staphylococcus aureus (BCID) DETECTED (A) NOT DETECTED Final    Comment: CRITICAL RESULT CALLED TO, READ BACK BY AND VERIFIED WITH: PHARMD MICHELLE L. [id number] FCP    Staphylococcus epidermidis NOT DETECTED NOT DETECTED Final   Staphylococcus lugdunensis NOT DETECTED NOT DETECTED Final   Streptococcus species NOT DETECTED NOT DETECTED Final   Streptococcus agalactiae NOT DETECTED NOT DETECTED Final   Streptococcus pneumoniae NOT DETECTED NOT DETECTED Final   Streptococcus pyogenes NOT DETECTED NOT DETECTED Final   A.calcoaceticus-baumannii NOT DETECTED NOT DETECTED Final   Bacteroides fragilis NOT DETECTED NOT DETECTED Final   Enterobacterales NOT DETECTED NOT DETECTED Final   Enterobacter cloacae complex NOT DETECTED NOT DETECTED Final   Escherichia coli NOT DETECTED NOT DETECTED Final   Klebsiella aerogenes NOT DETECTED NOT DETECTED Final   Klebsiella oxytoca NOT DETECTED NOT DETECTED Final   Klebsiella pneumoniae NOT DETECTED NOT DETECTED Final   Proteus species NOT DETECTED NOT DETECTED Final   Salmonella species NOT DETECTED NOT DETECTED Final   Serratia marcescens NOT DETECTED NOT DETECTED Final   Haemophilus influenzae NOT DETECTED NOT DETECTED Final   Neisseria meningitidis NOT DETECTED NOT DETECTED Final   Pseudomonas aeruginosa NOT DETECTED NOT DETECTED Final   Stenotrophomonas maltophilia NOT DETECTED NOT DETECTED Final   Candida albicans NOT DETECTED NOT DETECTED Final   Candida auris NOT  DETECTED NOT DETECTED Final   Candida glabrata NOT DETECTED NOT DETECTED Final   Candida krusei NOT DETECTED NOT DETECTED Final   Candida parapsilosis NOT DETECTED NOT DETECTED Final   Candida tropicalis NOT DETECTED NOT DETECTED Final   Cryptococcus neoformans/gattii NOT DETECTED NOT DETECTED Final   Meth resistant mecA/C and MREJ NOT DETECTED NOT DETECTED Final    Comment: Performed at [facility], [address] [place], [place] [address]  Blood Culture (routine x 2)     Status: Abnormal   Collection Time: 01/31/21 11:15 AM   Specimen: BLOOD  Result Value Ref Range Status   Specimen Description   Final    BLOOD RIGHT ANTECUBITAL Performed at [place] [address], [place], [place] [address]    Special Requests   Final    BOTTLES DRAWN AEROBIC AND ANAEROBIC Blood Culture adequate volume Performed at [place] [address] [place], [place] [address]    Culture  Setup Time   Final    GRAM POSITIVE COCCI IN CLUSTERS IN BOTH AEROBIC AND ANAEROBIC BOTTLES CRITICAL VALUE NOTED.  VALUE IS CONSISTENT WITH PREVIOUSLY REPORTED AND CALLED VALUE.    Culture (A)  Final    STAPHYLOCOCCUS AUREUS SUSCEPTIBILITIES PERFORMED ON PREVIOUS CULTURE WITHIN THE LAST 5 DAYS. Performed at [facility], [place] [address], [place], [place] [address]    Report Status 02/03/2021 FINAL  Final  MRSA PCR Screening     Status: None   Collection Time: 01/31/21  2:28 PM   Specimen: Nasal Mucosa; Nasopharyngeal  Result Value Ref Range Status   MRSA by PCR NEGATIVE NEGATIVE Final    Comment:        The GeneXpert MRSA Assay (FDA approved for NASAL specimens only), is one component of a comprehensive MRSA colonization surveillance program. It is not intended to diagnose MRSA infection nor to guide or monitor treatment for MRSA infections. Performed at [facility], [place] [address] [place], [place] [address]   Culture, blood  (routine x 2)     Status:  None   Collection Time: 02/01/21  2:20 PM   Specimen: BLOOD  Result Value Ref Range Status   Specimen Description   Final    BLOOD RIGHT ANTECUBITAL Performed at [place] [address], [place], [place] [address]    Special Requests   Final    BOTTLES DRAWN AEROBIC AND ANAEROBIC Blood Culture adequate volume Performed at [place] [address] [place], [place] [address]    Culture   Final    NO GROWTH 5 DAYS Performed at [facility], [place] [address] [place], [place] [address]    Report Status 02/06/2021 FINAL  Final  Culture, blood (routine x 2)     Status: None   Collection Time: 02/01/21  2:20 PM   Specimen: BLOOD RIGHT HAND  Result Value Ref Range Status   Specimen Description   Final    BLOOD RIGHT HAND Performed at [place] [address], [place], [place] [address]    Special Requests   Final    BOTTLES DRAWN AEROBIC AND ANAEROBIC Blood Culture adequate volume Performed at [facility] [address] [place], [place] [address]    Culture   Final    NO GROWTH 5 DAYS Performed at [facility], [place] [address] [place], [place] [address]    Report Status 02/06/2021 FINAL  Final  Aerobic/Anaerobic Culture w Gram Stain (surgical/deep wound)     Status: None   Collection Time: 02/02/21  4:12 PM   Specimen: Abscess  Result Value Ref Range Status   Specimen Description   Final    ABSCESS SUBSCAPULAR Performed at [place] [address] [place], [place] [address]    Special Requests   Final    NONE Performed at [facility], [place] [address] [place], [place] [address]    Gram Stain   Final    ABUNDANT WBC PRESENT, PREDOMINANTLY PMN ABUNDANT GRAM POSITIVE COCCI IN CLUSTERS IN TETRADS    Culture   Final    ABUNDANT STAPHYLOCOCCUS AUREUS NO ANAEROBES ISOLATED Performed at [facility],  [place] [address], [place], [place] [address]    Report Status 02/07/2021 FINAL  Final   Organism ID, Bacteria STAPHYLOCOCCUS AUREUS  Final      Susceptibility   Staphylococcus aureus - MIC*    CIPROFLOXACIN >=8 RESISTANT Resistant     ERYTHROMYCIN >=8 RESISTANT Resistant     GENTAMICIN <=0.5 SENSITIVE Sensitive     OXACILLIN 0.5 SENSITIVE Sensitive     TETRACYCLINE <=1 SENSITIVE Sensitive     VANCOMYCIN <=0.5 SENSITIVE Sensitive     TRIMETH/SULFA <=10 SENSITIVE Sensitive     CLINDAMYCIN <=0.25 SENSITIVE Sensitive     RIFAMPIN <=0.5 SENSITIVE Sensitive     Inducible Clindamycin NEGATIVE Sensitive     * ABUNDANT STAPHYLOCOCCUS AUREUS  Aerobic/Anaerobic Culture w Gram Stain (surgical/deep wound)     Status: None (Preliminary result)   Collection Time: 02/08/21  6:48 PM   Specimen: Synovial, Left Shoulder; Body Fluid  Result Value Ref Range Status   Specimen Description   Final    SHOULDER LEFT Performed at [facility] [place] [address] [place], [place] [address]    Special Requests   Final    NONE Performed at [facility], [place] [address], [place], [place] [address]    Gram Stain   Final    ABUNDANT WBC PRESENT,BOTH PMN AND MONONUCLEAR RARE  GRAM POSITIVE COCCI IN PAIRS Performed at [facility], [place] [address] [place], [place] [address]    Culture PENDING  Incomplete   Report Status PENDING  Incomplete  Aerobic/Anaerobic Culture w Gram Stain (surgical/deep wound)     Status: None (Preliminary result)   Collection Time: 02/08/21  6:52 PM   Specimen: Joint, Other; Body Fluid  Result Value Ref Range Status   Specimen Description   Final    SHOULDER LEFT BONE CHIPS Performed at [place] [address], [place], [place] [address]    Special Requests   Final    NONE Performed at [facility], [place] [address] [place], [place] [address]    Gram Stain   Final    NO WBC SEEN NO ORGANISMS  SEEN Performed at [facility], [place] [address] [place],  [address]    Culture PENDING  Incomplete   Report Status PENDING  Incomplete      RN Pressure Injury Documentation: Pressure Injury 01/31/21 Buttocks Right;Left;Posterior Stage 2 -  Partial thickness loss of dermis presenting as a shallow open injury with a red, pink wound bed without slough. (Active)  01/31/21 1546  Location: Buttocks  Location Orientation: Right;Left;Posterior  Staging: Stage 2 -  Partial thickness loss of dermis presenting as a shallow open injury with a red, pink wound bed without slough.  Wound Description (Comments):   Present on Admission: Yes     Pressure Injury (Active)     Location:   Location Orientation:   Staging:   Wound Description (Comments):   Present on Admission:      Estimated body mass index is 30.78 kg/m as calculated from the following:   Height as of this encounter: _0  (1.778 m).   Weight as of this encounter: 97.3 kg.  Malnutrition Type:   Malnutrition Characteristics:   Nutrition Interventions:    Radiology Studies: DG Knee 1-2 Views Right  Result Date: 02/08/2021 CLINICAL DATA:  Right knee pain EXAM: RIGHT KNEE - 2 VIEW COMPARISON:  None. FINDINGS: Medial joint space narrowing is noted with mild osteophytic change. Mild patellofemoral changes are noted as well. No joint effusion is seen. No acute fracture is noted. IMPRESSION: Mild degenerative change without acute abnormality. Electronically Signed   By: Inez Catalina M.D.   On: 02/08/2021 09:30   CT CHEST WO CONTRAST  Result Date: 02/09/2021 CLINICAL DATA:  LEFT posterior shoulder/axillary abscess post I & D, leukocytosis EXAM: CT CHEST WITHOUT CONTRAST TECHNIQUE: Multidetector CT imaging of the chest was performed following the standard protocol without IV contrast. Sagittal and coronal MPR images reconstructed from axial data set. COMPARISON:  CT chest 02/01/2021 FINDINGS: Cardiovascular: Aorta normal  caliber. Heart size normal. No pericardial effusion. Mediastinum/Nodes: Esophagus unremarkable. Base of cervical region normal appearance. Enlarged LEFT axillary lymph nodes identified. No additional intrathoracic adenopathy. Lungs/Pleura: Small BILATERAL pleural effusions with compressive atelectasis of the lower lobes. LEFT lower lobe nodule 10 mm diameter unchanged. Few calcified granulomata bilaterally. Mild central peribronchial thickening. No infiltrate or pneumothorax. Additional lingular nodule 5 mm diameter image 85 unchanged. Subpleural nodule anterior LEFT upper lobe 6 mm diameter image 66 unchanged. Upper Abdomen: Within limitations of beam hardening artifacts from arms, upper abdomen unremarkable Musculoskeletal: Degenerative disc disease changes thoracic spine. Osseous structures intact. Complex collection LEFT axilla and chest wall, extends from anterior to posterior from above the LEFT apex through the inferior extent of the exam at the lateral upper abdomen. Eight collection  contains gas and fluid consistent with abscess. Foci of fluid extend subscapular, superior to the LEFT first and second ribs, and anteriorly anterior to the glenohumeral joint and coracoid process. The portion of the collection subscapular is similar to the previous exam, 10.4 x 5.8 cm image 11. The lateral chest wall collection has markedly increased and now measures 17 cm AP, 6.3 cm transverse, and at least 16.2 cm craniocaudal. Foci of gas are identified retropectoral and RIGHT deep to the deltoid muscle. Gas is seen extending adjacent to the coracoid process. No definite osseous destruction. IMPRESSION: Marked increase in size of abscess collection at the LEFT lateral chest wall with persistent abscess again seen subscapular extending cranial to the apex of the LEFT hemithorax as described above. Since patient is post I & D of the chest wall abscess, it is uncertain how much of the increase in size of the abscess is due to  postsurgical changes versus progression of abscess collection Small BILATERAL pleural effusions and bibasilar atelectasis. Reactive LEFT axillary adenopathy. Electronically Signed   By: Lavonia Dana M.D.   On: 02/09/2021 08:55   CT SHOULDER LEFT WO CONTRAST  Result Date: 02/09/2021 CLINICAL DATA:  Patient status post left shoulder, arthrotomy, irrigation and debridement of the joint. Biceps tenotomy and excision of necrotic rotator cuff was also performed 02/08/2021. The patient has infection may extend into the right chest. EXAM: CT OF THE UPPER LEFT EXTREMITY WITHOUT CONTRAST TECHNIQUE: Multidetector CT imaging of the upper left extremity was performed according to the standard protocol. COMPARISON:  MRI right shoulder 02/07/2021. FINDINGS: Bones/Joint/Cartilage CT is not sensitive for the detection of osteomyelitis. No bony destructive change or periosteal reaction is identified. The patient has moderate acromioclavicular osteoarthritis. There appears to be a small glenohumeral joint effusion. No fracture or dislocation. Ligaments Suboptimally assessed by CT. Muscles and Tendons Gas and fluid are seen in multiple muscles about the shoulder. Discrete measurement collections is difficult but a gas and fluid within and superior to the supraspinatus muscle measures approximately 6.8 cm transverse by 3.5 cm AP by 1.7 cm craniocaudal. Gas and fluid are also seen surrounding the scapula and extending into the intercostal spaces between the second and third ribs as seen on image 24 series 7 and the fourth and fifth ribs as seen on image 59 of series 7. Soft tissues As above. IMPRESSION: Extensive abscess in the left chest wall and intercostal spaces most consistent with multifocal pyomyositis as described above. Flocculent gas in musculature about the left shoulder is worrisome for residual abscess although recent surgery complicates evaluation. CT is not sensitive for the detection of osteomyelitis but no evidence of  osteomyelitis is seen. These results were called by telephone at the time of interpretation on 02/09/2021 at 9:17 am to provider [facility] , who verbally acknowledged these results. Electronically Signed   By: Inge Rise M.D.   On: 02/09/2021 09:20    Scheduled Meds:  Chlorhexidine Gluconate Cloth  6 each Topical Daily   enoxaparin (LOVENOX) injection  40 mg Subcutaneous Q24H   gabapentin  800 mg Oral TID   insulin aspart  0-15 Units Subcutaneous TID WC   insulin aspart  0-5 Units Subcutaneous QHS   insulin aspart  3 Units Subcutaneous TID WC   insulin glargine  15 Units Subcutaneous Daily   mouth rinse  15 mL Mouth Rinse BID   oxyCODONE  20 mg Oral Q12H   polyethylene glycol  17 g Oral Daily   senna-docusate  1 tablet  Oral BID   Continuous Infusions:  sodium chloride Stopped (02/07/21 0508)    ceFAZolin (ANCEF) IV 2 g (02/09/21 1739)    LOS: 9 days   Kerney Elbe, DO [facility] PAGER is on AMION  If 7PM-7AM, please contact night-coverage [email or web address]

## 2021-02-09 NOTE — Anesthesia Preprocedure Evaluation (Addendum)
Anesthesia Evaluation  Patient identified by MRN, date of birth, ID band Patient awake    Reviewed: Allergy & Precautions, H&P , NPO status , Patient's Chart, lab work & pertinent test results  Airway Mallampati: II  TM Distance: >3 FB Neck ROM: Full    Dental no notable dental hx. (+) Poor Dentition, Chipped, Missing, Loose, Dental Advisory Given   Pulmonary neg pulmonary ROS, former smoker,    Pulmonary exam normal breath sounds clear to auscultation       Cardiovascular hypertension, Pt. on medications Normal cardiovascular exam Rhythm:Regular Rate:Normal  ECHO 6/22 1. Left ventricular ejection fraction, by estimation, is 55 to 60%. The left ventricle has normal function. The left ventricle has no regional wall motion abnormalities. There is mild left ventricular hypertrophy. Left ventricular diastolic parameters were normal. 2. Right ventricular systolic function is normal. The right ventricular size is normal. 3. The mitral valve is normal in structure. No evidence of mitral valve regurgitation. No evidence of mitral stenosis. 4. The aortic valve is tricuspid. There is mild calcification of the aortic valve. Aortic valve regurgitation is not visualized. Mild aortic valve sclerosis is present, with no evidence of aortic valve stenosis. 5. Aortic dilatation noted. There is mild dilatation of the ascending aorta, measuring 38 mm. 6. The inferior vena cava is normal in size with greater than 50% respiratory variability, suggesting right atrial pressure of 3 mmHg.   Neuro/Psych PSYCHIATRIC DISORDERS Anxiety Depression    GI/Hepatic negative GI ROS, Neg liver ROS,   Endo/Other  negative endocrine ROSdiabetes, Type 2, Oral Hypoglycemic Agents  Renal/GU   negative genitourinary   Musculoskeletal  (+) Arthritis , Osteoarthritis,    Abdominal   Peds negative pediatric ROS (+)  Hematology  (+) Blood dyscrasia, anemia ,    Anesthesia Other Findings   Reproductive/Obstetrics negative OB ROS                            Anesthesia Physical  Anesthesia Plan  ASA: 3  Anesthesia Plan: General   Post-op Pain Management:    Induction: Intravenous  PONV Risk Score and Plan: 3 and Ondansetron, Treatment may vary due to age or medical condition and Midazolam  Airway Management Planned: Oral ETT and LMA  Additional Equipment: None  Intra-op Plan:   Post-operative Plan: Extubation in OR  Informed Consent: I have reviewed the patients History and Physical, chart, labs and discussed the procedure including the risks, benefits and alternatives for the proposed anesthesia with the patient or authorized representative who has indicated his/her understanding and acceptance.     Dental advisory given  Plan Discussed with: CRNA and Anesthesiologist  Anesthesia Plan Comments:        Anesthesia Quick Evaluation

## 2021-02-09 NOTE — Progress Notes (Signed)
Orthopedic Tech Progress Note Patient Details:  John Ware [birth year] [id number]  Patient ID: [name], male   DOB: 1966/01/22, 55 y.o.   MRN: [id number]  [name] 02/09/2021, 11:30 AM Pt has sling on left arm

## 2021-02-09 NOTE — Consult Note (Addendum)
   John Ware 05/04/1966  [id number].    Requesting MD: Dr. Joshua Landau Chief Complaint/Reason for Consult: left chest wall abscess  HPI:  This is a 54 yo white male with a history of DM, HTN, chronic back pain on chronic narcotics secondary to spinal stenosis, who was admitted last week secondary to general malaise for 3 weeks prior at home.  Most history is taken from the chart as the patient isn't the best historian.  He apparently had a positive at home COVID test on May 20.  He presented to the ED on 5/28 after falling out of a chair and complained of shoulder pain.  His films were negative and he was given a sling for comfort.  He continued to feel poorly and returned on 6/8 as he was unable to walk at this time.  He has since been found to have MSSA bacteremia with endocarditis and a septic left shoulder/scapula that was debrided in the OR yesterday by Dr. Landau.  He underwent a CT scan today of his chest which revealed a large left lateral chest wall measuring 17x17x6cm in size.  We have been asked to see him for further evaluation and recommendations.   ROS: ROS: Please see HPI, otherwise all other systems are negative except some new BLE edema since admission as well as some right shoulder pain.  Family History  Problem Relation Age of Onset   Colon cancer Neg Hx     Past Medical History:  Diagnosis Date   Depression    Diabetes mellitus without complication (HCC)    Hypertension    Spinal stenosis    With Neurogenic Claudication    Past Surgical History:  Procedure Laterality Date   CARPAL TUNNEL RELEASE Bilateral    IR US GUIDE BX ASP/DRAIN  02/02/2021   IRRIGATION AND DEBRIDEMENT ABSCESS Left 02/08/2021   Procedure: MINOR INCISION AND DRAINAGE OF ABSCESS;  Surgeon: Landau, Joshua, MD;  Location: [facility];  Service: Orthopedics;  Laterality: Left;   LUMBAR LAMINECTOMY/DECOMPRESSION MICRODISCECTOMY N/A 12/13/2014   Procedure: LUMBAR THREE-FOUR, LUMBAR FOUR-FIVE,  LUMBAR FIVE-SACRAL ONE LAMINECTOMY;  Surgeon: Henry Elsner, MD;  Location: [facility];  Service: Neurosurgery;  Laterality: N/A;  L3-4 L4-5 L5-S1 Laminectomy   MENISCUS REPAIR     Right knee   TEE WITHOUT CARDIOVERSION N/A 02/05/2021   Procedure: TRANSESOPHAGEAL ECHOCARDIOGRAM (TEE);  Surgeon: Turner, Traci R, MD;  Location: [facility];  Service: Cardiovascular;  Laterality: N/A;   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 His smoking use included cigarettes. He has a 30.00 pack-year smoking history. He has never used smokeless tobacco. He reports current alcohol use. He reports that he does not use drugs.  Allergies:  Allergies  Allergen Reactions   Tizanidine     Leg cramps    Medications Prior to Admission  Medication Sig Dispense Refill   acetaminophen (TYLENOL) 325 MG tablet Take 650-975 mg by mouth every 6 (six) hours as needed for mild pain, fever or headache.     amLODipine (NORVASC) 5 MG tablet Take 1 tablet (5 mg total) by mouth daily. 90 tablet 3   buPROPion (WELLBUTRIN XL) 150 MG 24 hr tablet TAKE (1) TABLET TWICE A DAY. (Patient taking differently: Take 150 mg by mouth 2 (two) times daily.) 60 tablet 5   busPIRone (BUSPAR) 5 MG tablet TAKE 1 TABLET   3 TIMES A DAY (Patient taking differently: Take 5 mg by mouth 3 (three) times daily.) 90 tablet 1   gabapentin (NEURONTIN) 800 MG tablet TAKE (1) TABLET THREE TIMES DAILY. (Patient taking differently: Take 800 mg by mouth 3 (three) times daily.) 90 tablet 2   metFORMIN (GLUCOPHAGE XR) 750 MG 24 hr tablet Take 2 tablets (1,500 mg total) by mouth daily with breakfast. 180 tablet 2   methocarbamol (ROBAXIN) 500 MG tablet Take 1 tablet (500 mg total) by mouth 2 (two) times daily as needed for muscle spasms. 20 tablet 0   oxyCODONE ER (XTAMPZA ER) 18 MG C12A Take 1 tablet by mouth every 12 (twelve) hours.     oxyCODONE-acetaminophen  (PERCOCET) 10-325 MG tablet Take 1 tablet by mouth 3 (three) times daily as needed for pain.     ramipril (ALTACE) 10 MG capsule TAKE (1) CAPSULE DAILY (Patient taking differently: Take 10 mg by mouth daily.) 90 capsule 3   sertraline (ZOLOFT) 100 MG tablet Take 1 tablet (100 mg total) by mouth daily. 30 tablet 5   Vitamin D, Ergocalciferol, (DRISDOL) 1.25 MG (50000 UNIT) CAPS capsule Take 50,000 Units by mouth once a week.     diclofenac Sodium (VOLTAREN) 1 % GEL Apply 2 g topically 4 (four) times daily. (Patient not taking: Reported on 01/31/2021) 100 g 0   empagliflozin (JARDIANCE) 10 MG TABS tablet Take 1 tablet (10 mg total) by mouth daily before breakfast. (Patient not taking: Reported on 01/31/2021) 90 tablet 1     Physical Exam: Blood pressure 110/69, pulse 99, temperature (!) 97.4 F (36.3 C), temperature source Oral, resp. rate 18, height 5' 10" (1.778 m), weight 97.3 kg, SpO2 90 %. General: pleasant, WD, WN white male who is sitting up in his chair in NAD HEENT: head is normocephalic, atraumatic.  Sclera are noninjected.  PERRL.  Ears and nose without any masses or lesions.  Mouth is pink and moist Heart: regular, rate, and rhythm.  Normal s1,s2. No obvious murmurs, gallops, or rubs noted.  Palpable radial and pedal pulses bilaterally Lungs: CTAB, no wheezes, rhonchi, or rales noted.  Respiratory effort nonlabored Abd: soft, NT, ND, +BS, no masses, hernias, or organomegaly MS: all 4 extremities are symmetrical with no cyanosis, clubbing, except LUE with sling and bulky dressing in place after should surgery.  Some edema of LUE as expected.  BLE edema noted as well Skin: warm and dry with no masses, lesions, or rashes.  He does have swelling and some erythema on the left chest wall more so than the right, laterally.  No crepitus or frank fluctuance.  Neuro: Cranial nerves 2-12 grossly intact, sensation is normal throughout Psych: A&Ox3 with an appropriate affect.   Results for orders  placed or performed during the hospital encounter of 01/31/21 (from the past 48 hour(s))  Glucose, capillary     Status: Abnormal   Collection Time: 02/07/21 11:53 AM  Result Value Ref Range   Glucose-Capillary 230 (H) 70 - 99 mg/dL    Comment: Glucose reference range applies only to samples taken after fasting for at least 8 hours.  Glucose, capillary     Status: Abnormal   Collection Time: 02/07/21  4:17 PM  Result Value Ref Range   Glucose-Capillary 219 (H) 70 - 99 mg/dL    Comment: Glucose reference range applies only to samples taken after fasting for at least 8 hours.  Sedimentation rate     Status: Abnormal   Collection Time: 02/07/21  7:00 PM    Result Value Ref Range   Sed Rate 41 (H) 0 - 16 mm/hr    Comment: Performed at [facility], [address] [place], [place] [address]  C-reactive protein     Status: Abnormal   Collection Time: 02/07/21  7:00 PM  Result Value Ref Range   CRP 15.5 (H) <1.0 mg/dL    Comment: Performed at [facility], [address] [place], [place] [address]  Type and screen [facility]     Status: None   Collection Time: 02/07/21  7:03 PM  Result Value Ref Range   ABO/RH(D) B POS    Antibody Screen NEG    Sample Expiration      02/10/2021,2359 Performed at [facility], [address] [place], [place] [address]   Glucose, capillary     Status: Abnormal   Collection Time: 02/07/21  9:53 PM  Result Value Ref Range   Glucose-Capillary 236 (H) 70 - 99 mg/dL    Comment: Glucose reference range applies only to samples taken after fasting for at least 8 hours.  CBC with Differential/Platelet     Status: Abnormal   Collection Time: 02/08/21  2:34 AM  Result Value Ref Range   WBC 18.8 (H) 4.0 - 10.5 K/uL   RBC 3.14 (L) 4.22 - 5.81 MIL/uL   Hemoglobin 9.6 (L) 13.0 - 17.0 g/dL   HCT 28.1 (L) 39.0 - 52.0 %   MCV 89.5 80.0 - 100.0 fL   MCH 30.6 26.0 - 34.0 pg   MCHC 34.2 30.0  - 36.0 g/dL   RDW 15.0 11.5 - 15.5 %   Platelets 444 (H) 150 - 400 K/uL   nRBC 0.0 0.0 - 0.2 %   Neutrophils Relative % 85 %   Neutro Abs 15.8 (H) 1.7 - 7.7 K/uL   Lymphocytes Relative 8 %   Lymphs Abs 1.6 0.7 - 4.0 K/uL   Monocytes Relative 3 %   Monocytes Absolute 0.6 0.1 - 1.0 K/uL   Eosinophils Relative 0 %   Eosinophils Absolute 0.0 0.0 - 0.5 K/uL   Basophils Relative 0 %   Basophils Absolute 0.1 0.0 - 0.1 K/uL   Immature Granulocytes 4 %   Abs Immature Granulocytes 0.74 (H) 0.00 - 0.07 K/uL    Comment: Performed at [facility], [address] , [place] [address]  Comprehensive metabolic panel     Status: Abnormal   Collection Time: 02/08/21  2:34 AM  Result Value Ref Range   Sodium 128 (L) 135 - 145 mmol/L   Potassium 4.2 3.5 - 5.1 mmol/L   Chloride 92 (L) 98 - 111 mmol/L   CO2 29 22 - 32 mmol/L   Glucose, Bld 142 (H) 70 - 99 mg/dL    Comment: Glucose reference range applies only to samples taken after fasting for at least 8 hours.   BUN 7 6 - 20 mg/dL   Creatinine, Ser 0.44 (L) 0.61 - 1.24 mg/dL   Calcium 7.9 (L) 8.9 - 10.3 mg/dL   Total Protein 5.3 (L) 6.5 - 8.1 g/dL   Albumin 1.8 (L) 3.5 - 5.0 g/dL   AST 61 (H) 15 - 41 U/L   ALT 29 0 - 44 U/L   Alkaline Phosphatase 98 38 - 126 U/L   Total Bilirubin 0.7 0.3 - 1.2 mg/dL   GFR, Estimated >60 >60 mL/min    Comment: (NOTE) Calculated using the CKD-EPI Creatinine Equation (2021)    Anion   gap 7 5 - 15    Comment: Performed at [facility], [address] [place], [place] [address]  Magnesium     Status: None   Collection Time: 02/08/21  2:34 AM  Result Value Ref Range   Magnesium 2.0 1.7 - 2.4 mg/dL    Comment: Performed at [facility], [address] [place], [place] [address]  Phosphorus     Status: None   Collection Time: 02/08/21  2:34 AM  Result Value Ref Range   Phosphorus 4.5 2.5 - 4.6 mg/dL    Comment: Performed at [facility], [address] [place], [place] [address]  Glucose, capillary     Status: Abnormal   Collection Time: 02/08/21  7:47 AM  Result Value Ref Range   Glucose-Capillary 146 (H) 70 - 99 mg/dL    Comment: Glucose reference range applies only to samples taken after fasting for at least 8 hours.   Comment 1 Notify RN    Comment 2 Document in Chart   Glucose, capillary     Status: Abnormal   Collection Time: 02/08/21 12:19 PM  Result Value Ref Range   Glucose-Capillary 135 (H) 70 - 99 mg/dL    Comment: Glucose reference range applies only to samples taken after fasting for at least 8 hours.   Comment 1 Notify RN    Comment 2 Document in Chart   Glucose, capillary     Status: None   Collection Time: 02/08/21  3:37 PM  Result Value Ref Range   Glucose-Capillary 87 70 - 99 mg/dL    Comment: Glucose reference range applies only to samples taken after fasting for at least 8 hours.  Glucose, capillary     Status: Abnormal   Collection Time: 02/08/21  7:54 PM  Result Value Ref Range   Glucose-Capillary 116 (H) 70 - 99 mg/dL    Comment: Glucose reference range applies only to samples taken after fasting for at least 8 hours.  Glucose, capillary     Status: Abnormal   Collection Time: 02/08/21  9:48 PM  Result Value Ref Range   Glucose-Capillary 164 (H) 70 - 99 mg/dL    Comment: Glucose reference range applies only to samples taken after fasting for at least 8 hours.  CBC with Differential/Platelet     Status: Abnormal   Collection Time: 02/09/21  5:02 AM  Result Value Ref Range   WBC 18.5 (H) 4.0 - 10.5 K/uL   RBC 3.31 (L) 4.22 - 5.81 MIL/uL   Hemoglobin 10.2 (L) 13.0 - 17.0 g/dL   HCT 30.4 (L) 39.0 - 52.0 %   MCV 91.8 80.0 - 100.0 fL   MCH 30.8 26.0 - 34.0 pg   MCHC 33.6 30.0 - 36.0 g/dL   RDW 15.3 11.5 - 15.5 %   Platelets 460 (H) 150 - 400 K/uL   nRBC 0.0 0.0 - 0.2 %   Neutrophils Relative % 93 %   Neutro Abs 17.2 (H) 1.7 - 7.7 K/uL   Lymphocytes Relative 4 %   Lymphs Abs  0.8 0.7 - 4.0 K/uL   Monocytes Relative 1 %   Monocytes Absolute 0.2 0.1 - 1.0 K/uL   Eosinophils Relative 0 %   Eosinophils Absolute 0.0 0.0 - 0.5 K/uL   Basophils Relative 0 %   Basophils Absolute 0.0 0.0 - 0.1 K/uL   Immature Granulocytes 2 %   Abs Immature Granulocytes 0.31 (H) 0.00 - 0.07 K/uL    Comment:   Performed at [facility], [address] [place], [place] [address]  Comprehensive metabolic panel     Status: Abnormal   Collection Time: 02/09/21  5:02 AM  Result Value Ref Range   Sodium 132 (L) 135 - 145 mmol/L   Potassium 5.2 (H) 3.5 - 5.1 mmol/L    Comment: DELTA CHECK NOTED NO VISIBLE HEMOLYSIS    Chloride 96 (L) 98 - 111 mmol/L   CO2 30 22 - 32 mmol/L   Glucose, Bld 405 (H) 70 - 99 mg/dL    Comment: Glucose reference range applies only to samples taken after fasting for at least 8 hours.   BUN 13 6 - 20 mg/dL   Creatinine, Ser 0.69 0.61 - 1.24 mg/dL   Calcium 8.0 (L) 8.9 - 10.3 mg/dL   Total Protein 5.4 (L) 6.5 - 8.1 g/dL   Albumin 1.7 (L) 3.5 - 5.0 g/dL   AST 35 15 - 41 U/L   ALT 25 0 - 44 U/L   Alkaline Phosphatase 95 38 - 126 U/L   Total Bilirubin 0.7 0.3 - 1.2 mg/dL   GFR, Estimated >60 >60 mL/min    Comment: (NOTE) Calculated using the CKD-EPI Creatinine Equation (2021)    Anion gap 6 5 - 15    Comment: Performed at [facility], [address] [place], [place] [address]  Phosphorus     Status: Abnormal   Collection Time: 02/09/21  5:02 AM  Result Value Ref Range   Phosphorus 5.0 (H) 2.5 - 4.6 mg/dL    Comment: Performed at [facility], [address] [place], [place] [address]  Magnesium     Status: None   Collection Time: 02/09/21  5:02 AM  Result Value Ref Range   Magnesium 1.9 1.7 - 2.4 mg/dL    Comment: Performed at [facility], [address] [place], [place] [address]  Glucose, capillary     Status: Abnormal   Collection Time: 02/09/21  8:20 AM  Result Value  Ref Range   Glucose-Capillary 381 (H) 70 - 99 mg/dL    Comment: Glucose reference range applies only to samples taken after fasting for at least 8 hours.   DG Knee 1-2 Views Right  Result Date: 02/08/2021 CLINICAL DATA:  Right knee pain EXAM: RIGHT KNEE - 2 VIEW COMPARISON:  None. FINDINGS: Medial joint space narrowing is noted with mild osteophytic change. Mild patellofemoral changes are noted as well. No joint effusion is seen. No acute fracture is noted. IMPRESSION: Mild degenerative change without acute abnormality. Electronically Signed   By: Mark  Lukens M.D.   On: 02/08/2021 09:30   CT CHEST WO CONTRAST  Result Date: 02/09/2021 CLINICAL DATA:  LEFT posterior shoulder/axillary abscess post I & D, leukocytosis EXAM: CT CHEST WITHOUT CONTRAST TECHNIQUE: Multidetector CT imaging of the chest was performed following the standard protocol without IV contrast. Sagittal and coronal MPR images reconstructed from axial data set. COMPARISON:  CT chest 02/01/2021 FINDINGS: Cardiovascular: Aorta normal caliber. Heart size normal. No pericardial effusion. Mediastinum/Nodes: Esophagus unremarkable. Base of cervical region normal appearance. Enlarged LEFT axillary lymph nodes identified. No additional intrathoracic adenopathy. Lungs/Pleura: Small BILATERAL pleural effusions with compressive atelectasis of the lower lobes. LEFT lower lobe nodule 10 mm diameter unchanged. Few calcified granulomata bilaterally. Mild central peribronchial thickening. No infiltrate or pneumothorax. Additional lingular nodule 5 mm diameter image 85 unchanged. Subpleural nodule anterior LEFT upper lobe 6 mm diameter image 66 unchanged. Upper Abdomen: Within limitations of   beam hardening artifacts from arms, upper abdomen unremarkable Musculoskeletal: Degenerative disc disease changes thoracic spine. Osseous structures intact. Complex collection LEFT axilla and chest wall, extends from anterior to posterior from above the LEFT apex through  the inferior extent of the exam at the lateral upper abdomen. Eight collection contains gas and fluid consistent with abscess. Foci of fluid extend subscapular, superior to the LEFT first and second ribs, and anteriorly anterior to the glenohumeral joint and coracoid process. The portion of the collection subscapular is similar to the previous exam, 10.4 x 5.8 cm image 11. The lateral chest wall collection has markedly increased and now measures 17 cm AP, 6.3 cm transverse, and at least 16.2 cm craniocaudal. Foci of gas are identified retropectoral and RIGHT deep to the deltoid muscle. Gas is seen extending adjacent to the coracoid process. No definite osseous destruction. IMPRESSION: Marked increase in size of abscess collection at the LEFT lateral chest wall with persistent abscess again seen subscapular extending cranial to the apex of the LEFT hemithorax as described above. Since patient is post I & D of the chest wall abscess, it is uncertain how much of the increase in size of the abscess is due to postsurgical changes versus progression of abscess collection Small BILATERAL pleural effusions and bibasilar atelectasis. Reactive LEFT axillary adenopathy. Electronically Signed   By: Mark  Boles M.D.   On: 02/09/2021 08:55   CT SHOULDER LEFT WO CONTRAST  Result Date: 02/09/2021 CLINICAL DATA:  Patient status post left shoulder, arthrotomy, irrigation and debridement of the joint. Biceps tenotomy and excision of necrotic rotator cuff was also performed 02/08/2021. The patient has infection may extend into the right chest. EXAM: CT OF THE UPPER LEFT EXTREMITY WITHOUT CONTRAST TECHNIQUE: Multidetector CT imaging of the upper left extremity was performed according to the standard protocol. COMPARISON:  MRI right shoulder 02/07/2021. FINDINGS: Bones/Joint/Cartilage CT is not sensitive for the detection of osteomyelitis. No bony destructive change or periosteal reaction is identified. The patient has moderate  acromioclavicular osteoarthritis. There appears to be a small glenohumeral joint effusion. No fracture or dislocation. Ligaments Suboptimally assessed by CT. Muscles and Tendons Gas and fluid are seen in multiple muscles about the shoulder. Discrete measurement collections is difficult but a gas and fluid within and superior to the supraspinatus muscle measures approximately 6.8 cm transverse by 3.5 cm AP by 1.7 cm craniocaudal. Gas and fluid are also seen surrounding the scapula and extending into the intercostal spaces between the second and third ribs as seen on image 24 series 7 and the fourth and fifth ribs as seen on image 59 of series 7. Soft tissues As above. IMPRESSION: Extensive abscess in the left chest wall and intercostal spaces most consistent with multifocal pyomyositis as described above. Flocculent gas in musculature about the left shoulder is worrisome for residual abscess although recent surgery complicates evaluation. CT is not sensitive for the detection of osteomyelitis but no evidence of osteomyelitis is seen. These results were called by telephone at the time of interpretation on 02/09/2021 at 9:17 am to provider JOSHUA LANDAU , who verbally acknowledged these results. Electronically Signed   By: Thomas  Dalessio M.D.   On: 02/09/2021 09:20      Assessment/Plan Left lateral chest wall abscess The patient's imaging has been reviewed and he appears to have a large 17x16x6cm fluid collection in the left lateral chest wall.  It does not appear to have any communication to the actual thorax cavity.  This will need to be drained.    He is returning to the OR tomorrow to address his right shoulder with Dr. Landau.  Will discuss if we can do a combination case with him to avoid anesthesia 3 days in a row for this gentleman.  The surgery itself as well as the possible need for multiple trips back to the operating room have been discussed with the patient and he understands and agrees.   FEN -  regular diet, likely NPO p MN for OR tomorrow  VTE - Lovenox ID - Ancef  MSSA bacteremia Endocarditis DM HTN  Chronic back pain Left shoulder abscess, s/p debridement  Kelly E Osborne, PA-C [facility] 02/09/2021, 10:58 AM Please see Amion for pager number during day hours 7:00am-4:30pm or 7:00am -11:30am on weekends   Addendum: I have seen and examined the patient and agree with the assessment and plans.  Since Ortho is returning to the OR tomorrow, will plan on incision and drainage of the large left lateral chest wall abscess at the same time by one of my partners.  The patient agrees with the plan.  John Roker, MD  Angeleen Horney A. Mackinley Kiehn  MD, FACS    

## 2021-02-09 NOTE — TOC Progression Note (Signed)
Transition of Care [facility]) - Progression Note    Patient Details  Name: John Ware MRN: [id number] Date of Birth: [birth year]  [facility] CM/SW Contact  Karuna Balducci, Olegario Messier, RN Phone Number: 02/09/2021, 10:46 AM  Clinical Narrative:  Noted PT-TBD;not medically stable to determine if CIR vs SNF is appropriate.Recommend for PT to conitnue to follow-await medical stability to reassess for appropriate level of care.    Expected Discharge Plan:  (other-not medically stable to determine if CIR vs SNF is appropriate.) Barriers to Discharge: Continued Medical Work up  Expected Discharge Plan and Services Expected Discharge Plan:  (other-not medically stable to determine if CIR vs SNF is appropriate.)   Discharge Planning Services: CM Consult   Living arrangements for the past 2 months: Single Family Home                                       Social Determinants of Health (SDOH) Interventions    Readmission Risk Interventions No flowsheet data found.

## 2021-02-10 ENCOUNTER — Inpatient Hospital Stay (HOSPITAL_COMMUNITY): Payer: Commercial Managed Care - PPO

## 2021-02-10 ENCOUNTER — Inpatient Hospital Stay (HOSPITAL_COMMUNITY): Payer: Commercial Managed Care - PPO | Admitting: Certified Registered Nurse Anesthetist

## 2021-02-10 ENCOUNTER — Encounter (HOSPITAL_COMMUNITY)
Admission: EM | Disposition: A | Discharge status: Home Health | Payer: Self-pay | Source: Home / Self Care | Attending: Internal Medicine

## 2021-02-10 DIAGNOSIS — F419 Anxiety disorder, unspecified: Secondary | ICD-10-CM | POA: Diagnosis not present

## 2021-02-10 DIAGNOSIS — I77810 Thoracic aortic ectasia: Secondary | ICD-10-CM | POA: Diagnosis not present

## 2021-02-10 DIAGNOSIS — I10 Essential (primary) hypertension: Secondary | ICD-10-CM

## 2021-02-10 DIAGNOSIS — R7881 Bacteremia: Secondary | ICD-10-CM | POA: Diagnosis not present

## 2021-02-10 DIAGNOSIS — E1110 Type 2 diabetes mellitus with ketoacidosis without coma: Secondary | ICD-10-CM

## 2021-02-10 DIAGNOSIS — A419 Sepsis, unspecified organism: Secondary | ICD-10-CM | POA: Diagnosis not present

## 2021-02-10 HISTORY — PX: TOTAL SHOULDER ARTHROPLASTY: SHX126

## 2021-02-10 HISTORY — PX: IRRIGATION AND DEBRIDEMENT ABSCESS: SHX5252

## 2021-02-10 LAB — COMPREHENSIVE METABOLIC PANEL
ALT: 19 U/L (ref 0–44)
AST: 35 U/L (ref 15–41)
Albumin: 1.8 g/dL — ABNORMAL LOW (ref 3.5–5.0)
Alkaline Phosphatase: 73 U/L (ref 38–126)
Anion gap: 8 (ref 5–15)
BUN: 10 mg/dL (ref 6–20)
CO2: 27 mmol/L (ref 22–32)
Calcium: 7.8 mg/dL — ABNORMAL LOW (ref 8.9–10.3)
Chloride: 95 mmol/L — ABNORMAL LOW (ref 98–111)
Creatinine, Ser: 0.68 mg/dL (ref 0.61–1.24)
GFR, Estimated: >60 mL/min (ref >60)
Glucose, Bld: 168 mg/dL — ABNORMAL HIGH (ref 70–99)
Potassium: 4.9 mmol/L (ref 3.5–5.1)
Sodium: 130 mmol/L — ABNORMAL LOW (ref 135–145)
Total Bilirubin: 0.8 mg/dL (ref 0.3–1.2)
Total Protein: 5 g/dL — ABNORMAL LOW (ref 6.5–8.1)

## 2021-02-10 LAB — MAGNESIUM: Magnesium: 1.8 mg/dL (ref 1.7–2.4)

## 2021-02-10 LAB — CBC WITH DIFFERENTIAL/PLATELET
Abs Immature Granulocytes: 0.21 10*3/uL — ABNORMAL HIGH (ref 0.00–0.07)
Basophils Absolute: 0 10*3/uL (ref 0.0–0.1)
Basophils Relative: 0 %
Eosinophils Absolute: 0 10*3/uL (ref 0.0–0.5)
Eosinophils Relative: 0 %
HCT: 27.9 % — ABNORMAL LOW (ref 39.0–52.0)
Hemoglobin: 9.4 g/dL — ABNORMAL LOW (ref 13.0–17.0)
Immature Granulocytes: 1 %
Lymphocytes Relative: 10 %
Lymphs Abs: 1.7 10*3/uL (ref 0.7–4.0)
MCH: 31 pg (ref 26.0–34.0)
MCHC: 33.7 g/dL (ref 30.0–36.0)
MCV: 92.1 fL (ref 80.0–100.0)
Monocytes Absolute: 0.6 10*3/uL (ref 0.1–1.0)
Monocytes Relative: 3 %
Neutro Abs: 15.7 10*3/uL — ABNORMAL HIGH (ref 1.7–7.7)
Neutrophils Relative %: 86 %
Platelets: 536 10*3/uL — ABNORMAL HIGH (ref 150–400)
RBC: 3.03 MIL/uL — ABNORMAL LOW (ref 4.22–5.81)
RDW: 15.6 % — ABNORMAL HIGH (ref 11.5–15.5)
WBC: 18.3 10*3/uL — ABNORMAL HIGH (ref 4.0–10.5)
nRBC: 0 % (ref 0.0–0.2)

## 2021-02-10 LAB — GLUCOSE, CAPILLARY
Glucose-Capillary: 166 mg/dL — ABNORMAL HIGH (ref 70–99)
Glucose-Capillary: 274 mg/dL — ABNORMAL HIGH (ref 70–99)
Glucose-Capillary: 278 mg/dL — ABNORMAL HIGH (ref 70–99)

## 2021-02-10 LAB — PHOSPHORUS: Phosphorus: 3.6 mg/dL (ref 2.5–4.6)

## 2021-02-10 SURGERY — ARTHROPLASTY, SHOULDER, TOTAL
Anesthesia: General | Site: Shoulder | Laterality: Right

## 2021-02-10 MED ORDER — PHENYLEPHRINE HCL (PRESSORS) 10 MG/ML IV SOLN
INTRAVENOUS | Status: AC
  Filled 2021-02-10: qty 1

## 2021-02-10 MED ORDER — PHENYLEPHRINE HCL-NACL 10-0.9 MG/250ML-% IV SOLN
INTRAVENOUS | Status: DC | PRN
  Administered 2021-02-10: 80 ug/min via INTRAVENOUS

## 2021-02-10 MED ORDER — HYDROMORPHONE HCL 1 MG/ML IJ SOLN
INTRAMUSCULAR | Status: AC
  Administered 2021-02-10: 0.5 mg via INTRAVENOUS
  Filled 2021-02-10: qty 1

## 2021-02-10 MED ORDER — MIDAZOLAM HCL 2 MG/2ML IJ SOLN
INTRAMUSCULAR | Status: DC | PRN
  Administered 2021-02-10 (×2): 1 mg via INTRAVENOUS

## 2021-02-10 MED ORDER — ONDANSETRON HCL 4 MG/2ML IJ SOLN: 4.0000 mg | Freq: Once | INTRAMUSCULAR | Status: DC | PRN

## 2021-02-10 MED ORDER — IRRISEPT - 450ML BOTTLE WITH 0.05% CHG IN STERILE WATER, USP 99.95% OPTIME
TOPICAL | Status: DC | PRN
  Administered 2021-02-10: 450 mL

## 2021-02-10 MED ORDER — ACETAMINOPHEN 500 MG PO TABS
1000.0000 mg | ORAL_TABLET | Freq: Once | ORAL | Status: AC
  Administered 2021-02-10: 1000 mg via ORAL
  Filled 2021-02-10: qty 2

## 2021-02-10 MED ORDER — SODIUM CHLORIDE 0.9 % IR SOLN
Status: DC | PRN
  Administered 2021-02-10: 7000 mL
  Administered 2021-02-10: 3000 mL

## 2021-02-10 MED ORDER — CHLORHEXIDINE GLUCONATE 4 % EX LIQD
60.0000 mL | Freq: Once | CUTANEOUS | Status: AC
  Administered 2021-02-12: 4 via TOPICAL
  Filled 2021-02-10: qty 60

## 2021-02-10 MED ORDER — FENTANYL CITRATE (PF) 250 MCG/5ML IJ SOLN
INTRAMUSCULAR | Status: AC
  Filled 2021-02-10: qty 5

## 2021-02-10 MED ORDER — ENOXAPARIN SODIUM 40 MG/0.4ML IJ SOSY
40.0000 mg | PREFILLED_SYRINGE | INTRAMUSCULAR | Status: DC
  Administered 2021-02-11 – 2021-02-27 (×15): 40 mg via SUBCUTANEOUS
  Filled 2021-02-10 (×16): qty 0.4

## 2021-02-10 MED ORDER — ROCURONIUM BROMIDE 10 MG/ML (PF) SYRINGE
PREFILLED_SYRINGE | INTRAVENOUS | Status: AC
  Filled 2021-02-10: qty 10

## 2021-02-10 MED ORDER — FENTANYL CITRATE (PF) 250 MCG/5ML IJ SOLN
INTRAMUSCULAR | Status: DC | PRN
  Administered 2021-02-10 (×4): 50 ug via INTRAVENOUS

## 2021-02-10 MED ORDER — ACETAMINOPHEN 160 MG/5ML PO SOLN: 325.0000 mg | ORAL | Status: DC | PRN

## 2021-02-10 MED ORDER — ALBUMIN HUMAN 5 % IV SOLN: INTRAVENOUS | Status: DC | PRN

## 2021-02-10 MED ORDER — FENTANYL CITRATE (PF) 100 MCG/2ML IJ SOLN
INTRAMUSCULAR | Status: AC
  Administered 2021-02-10: 50 ug via INTRAVENOUS
  Filled 2021-02-10: qty 2

## 2021-02-10 MED ORDER — HYDROMORPHONE HCL 1 MG/ML IJ SOLN: 0.5000 mg | INTRAMUSCULAR | Status: DC | PRN

## 2021-02-10 MED ORDER — LIDOCAINE 2% (20 MG/ML) 5 ML SYRINGE
INTRAMUSCULAR | Status: DC | PRN
  Administered 2021-02-10: 100 mg via INTRAVENOUS

## 2021-02-10 MED ORDER — 0.9 % SODIUM CHLORIDE (POUR BTL) OPTIME
TOPICAL | Status: DC | PRN
  Administered 2021-02-10 (×2): 1000 mL

## 2021-02-10 MED ORDER — ACETAMINOPHEN 10 MG/ML IV SOLN
INTRAVENOUS | Status: AC
  Administered 2021-02-10: 1000 mg via INTRAVENOUS
  Filled 2021-02-10: qty 100

## 2021-02-10 MED ORDER — OXYCODONE HCL 5 MG/5ML PO SOLN: 5.0000 mg | Freq: Once | ORAL | Status: DC | PRN

## 2021-02-10 MED ORDER — PHENYLEPHRINE 40 MCG/ML (10ML) SYRINGE FOR IV PUSH (FOR BLOOD PRESSURE SUPPORT)
PREFILLED_SYRINGE | INTRAVENOUS | Status: AC
  Filled 2021-02-10: qty 20

## 2021-02-10 MED ORDER — ONDANSETRON HCL 4 MG/2ML IJ SOLN
INTRAMUSCULAR | Status: AC
  Filled 2021-02-10: qty 2

## 2021-02-10 MED ORDER — PROPOFOL 10 MG/ML IV BOLUS
INTRAVENOUS | Status: DC | PRN
  Administered 2021-02-10: 170 mg via INTRAVENOUS

## 2021-02-10 MED ORDER — ACETAMINOPHEN 325 MG PO TABS: 325.0000 mg | ORAL_TABLET | ORAL | Status: DC | PRN

## 2021-02-10 MED ORDER — ROCURONIUM BROMIDE 10 MG/ML (PF) SYRINGE
PREFILLED_SYRINGE | INTRAVENOUS | Status: DC | PRN
  Administered 2021-02-10: 70 mg via INTRAVENOUS
  Administered 2021-02-10 (×2): 20 mg via INTRAVENOUS

## 2021-02-10 MED ORDER — MEPERIDINE HCL 50 MG/ML IJ SOLN: 6.2500 mg | INTRAMUSCULAR | Status: DC | PRN

## 2021-02-10 MED ORDER — LACTATED RINGERS IV SOLN: INTRAVENOUS | Status: DC | PRN

## 2021-02-10 MED ORDER — CEFAZOLIN SODIUM-DEXTROSE 2-4 GM/100ML-% IV SOLN
INTRAVENOUS | Status: AC
  Administered 2021-02-10: 2 g via INTRAVENOUS
  Filled 2021-02-10: qty 100

## 2021-02-10 MED ORDER — FENTANYL CITRATE (PF) 100 MCG/2ML IJ SOLN: 50.0000 ug | Freq: Once | INTRAMUSCULAR | Status: DC

## 2021-02-10 MED ORDER — GLYCOPYRROLATE PF 0.2 MG/ML IJ SOSY
PREFILLED_SYRINGE | INTRAMUSCULAR | Status: DC | PRN
  Administered 2021-02-10: .2 mg via INTRAVENOUS

## 2021-02-10 MED ORDER — PROPOFOL 10 MG/ML IV BOLUS
INTRAVENOUS | Status: AC
  Filled 2021-02-10: qty 20

## 2021-02-10 MED ORDER — POVIDONE-IODINE 10 % EX SWAB: 2.0000 "application " | Freq: Once | CUTANEOUS | Status: DC

## 2021-02-10 MED ORDER — OXYCODONE HCL 5 MG PO TABS: 5.0000 mg | ORAL_TABLET | Freq: Once | ORAL | Status: DC | PRN

## 2021-02-10 MED ORDER — SUGAMMADEX SODIUM 200 MG/2ML IV SOLN
INTRAVENOUS | Status: DC | PRN
  Administered 2021-02-10: 200 mg via INTRAVENOUS

## 2021-02-10 MED ORDER — ACETAMINOPHEN 10 MG/ML IV SOLN: 1000.0000 mg | Freq: Once | INTRAVENOUS | Status: AC

## 2021-02-10 MED ORDER — LIDOCAINE 2% (20 MG/ML) 5 ML SYRINGE
INTRAMUSCULAR | Status: AC
  Filled 2021-02-10: qty 5

## 2021-02-10 MED ORDER — FENTANYL CITRATE (PF) 100 MCG/2ML IJ SOLN
25.0000 ug | INTRAMUSCULAR | Status: DC | PRN
  Administered 2021-02-10: 50 ug via INTRAVENOUS

## 2021-02-10 MED ORDER — MIDAZOLAM HCL 2 MG/2ML IJ SOLN
INTRAMUSCULAR | Status: AC
  Filled 2021-02-10: qty 2

## 2021-02-10 MED ORDER — PHENYLEPHRINE 40 MCG/ML (10ML) SYRINGE FOR IV PUSH (FOR BLOOD PRESSURE SUPPORT)
PREFILLED_SYRINGE | INTRAVENOUS | Status: DC | PRN
  Administered 2021-02-10: 80 ug via INTRAVENOUS
  Administered 2021-02-10 (×2): 120 ug via INTRAVENOUS

## 2021-02-10 SURGICAL SUPPLY — 87 items
ADH SKN CLS APL DERMABOND .7 (GAUZE/BANDAGES/DRESSINGS)
AID PSTN UNV HD RSTRNT DISP (MISCELLANEOUS) ×2
BAG COUNTER SPONGE SURGICOUNT (BAG) IMPLANT
BAG SPEC THK2 15X12 ZIP CLS (MISCELLANEOUS) ×2
BAG SPNG CNTER NS LX DISP (BAG)
BAG ZIPLOCK 12X15 (MISCELLANEOUS) ×3 IMPLANT
BLADE SAW SGTL 18X1.27X75 (BLADE) ×2 IMPLANT
BLADE SAW SGTL 81X20 HD (BLADE) ×1 IMPLANT
BNDG GAUZE ELAST 4 BULKY (GAUZE/BANDAGES/DRESSINGS) ×2 IMPLANT
CLSR STERI-STRIP ANTIMIC 1/2X4 (GAUZE/BANDAGES/DRESSINGS) ×3 IMPLANT
COOLER ICEMAN CLASSIC (MISCELLANEOUS) ×2 IMPLANT
COVER BACK TABLE 60X90IN (DRAPES) ×3 IMPLANT
COVER MAYO STAND STRL (DRAPES) ×3 IMPLANT
COVER SURGICAL LIGHT HANDLE (MISCELLANEOUS) ×6 IMPLANT
DECANTER SPIKE VIAL GLASS SM (MISCELLANEOUS) IMPLANT
DERMABOND ADVANCED (GAUZE/BANDAGES/DRESSINGS)
DERMABOND ADVANCED .7 DNX12 (GAUZE/BANDAGES/DRESSINGS) IMPLANT
DRAPE LAPAROSCOPIC ABDOMINAL (DRAPES) IMPLANT
DRAPE LAPAROTOMY T 102X78X121 (DRAPES) IMPLANT
DRAPE LAPAROTOMY T 98X78 PEDS (DRAPES) IMPLANT
DRAPE LAPAROTOMY TRNSV 102X78 (DRAPES) IMPLANT
DRAPE POUCH INSTRU U-SHP 10X18 (DRAPES) ×3 IMPLANT
DRAPE SHEET LG 3/4 BI-LAMINATE (DRAPES) ×3 IMPLANT
DRAPE SURG 17X11 SM STRL (DRAPES) ×3 IMPLANT
DRAPE U-SHAPE 47X51 STRL (DRAPES) ×3 IMPLANT
DRAPE UTILITY XL STRL (DRAPES) ×3 IMPLANT
DRESSING MEPILEX FLEX 4X4 (GAUZE/BANDAGES/DRESSINGS) IMPLANT
DRSG MEPILEX BORDER 4X8 (GAUZE/BANDAGES/DRESSINGS) ×3 IMPLANT
DRSG MEPILEX FLEX 4X4 (GAUZE/BANDAGES/DRESSINGS) ×3
DRSG PAD ABDOMINAL 8X10 ST (GAUZE/BANDAGES/DRESSINGS) ×3 IMPLANT
DURAPREP 26ML APPLICATOR (WOUND CARE) ×3 IMPLANT
ELECT REM PT RETURN 15FT ADLT (MISCELLANEOUS) ×6 IMPLANT
GAUZE PACKING IODOFORM 1/4X15 (PACKING) IMPLANT
GAUZE SPONGE 4X4 12PLY STRL (GAUZE/BANDAGES/DRESSINGS) ×4 IMPLANT
GAUZE XEROFORM 1X8 LF (GAUZE/BANDAGES/DRESSINGS) ×1 IMPLANT
GLOVE SRG 8 PF TXTR STRL LF DI (GLOVE) ×2 IMPLANT
GLOVE SURG ENC MOIS LTX SZ6.5 (GLOVE) ×3 IMPLANT
GLOVE SURG ENC MOIS LTX SZ7.5 (GLOVE) ×3 IMPLANT
GLOVE SURG POLY ORTHO LF SZ7.5 (GLOVE) ×3 IMPLANT
GLOVE SURG UNDER POLY LF SZ7 (GLOVE) ×3 IMPLANT
GLOVE SURG UNDER POLY LF SZ8 (GLOVE) ×3
GOWN STRL REUS W/TWL LRG LVL3 (GOWN DISPOSABLE) ×6 IMPLANT
GOWN STRL REUS W/TWL XL LVL3 (GOWN DISPOSABLE) ×6 IMPLANT
HANDPIECE INTERPULSE COAX TIP (DISPOSABLE)
HOOD PEEL AWAY FLYTE STAYCOOL (MISCELLANEOUS) ×6 IMPLANT
JET LAVAGE IRRISEPT WOUND (IRRIGATION / IRRIGATOR) ×3
KIT BASIN OR (CUSTOM PROCEDURE TRAY) ×6 IMPLANT
KIT TURNOVER KIT A (KITS) ×6 IMPLANT
LAVAGE JET IRRISEPT WOUND (IRRIGATION / IRRIGATOR) IMPLANT
MARKER SKIN DUAL TIP RULER LAB (MISCELLANEOUS) IMPLANT
NDL HYPO 25X1 1.5 SAFETY (NEEDLE) ×2 IMPLANT
NEEDLE HYPO 25X1 1.5 SAFETY (NEEDLE) ×3 IMPLANT
NS IRRIG 1000ML POUR BTL (IV SOLUTION) ×3 IMPLANT
PACK GENERAL/GYN (CUSTOM PROCEDURE TRAY) ×3 IMPLANT
PACK SHOULDER (CUSTOM PROCEDURE TRAY) ×3 IMPLANT
PAD COLD SHLDR WRAP-ON (PAD) ×2 IMPLANT
PENCIL SMOKE EVACUATOR (MISCELLANEOUS) IMPLANT
PROTECTOR NERVE ULNAR (MISCELLANEOUS) ×3 IMPLANT
RESTRAINT HEAD UNIVERSAL NS (MISCELLANEOUS) ×3 IMPLANT
SET HNDPC FAN SPRY TIP SCT (DISPOSABLE) ×2 IMPLANT
SET IRRIG Y TYPE TUR BLADDER L (SET/KITS/TRAYS/PACK) ×1 IMPLANT
SLING ARM FOAM STRAP LRG (SOFTGOODS) ×1 IMPLANT
SLING ARM IMMOBILIZER LRG (SOFTGOODS) ×3 IMPLANT
SMARTMIX MINI TOWER (MISCELLANEOUS)
SPONGE LAP 4X18 RFD (DISPOSABLE) IMPLANT
SPONGE T-LAP 18X18 ~~LOC~~+RFID (SPONGE) IMPLANT
STAPLER VISISTAT 35W (STAPLE) IMPLANT
SUCTION FRAZIER HANDLE 12FR (TUBING) ×3
SUCTION TUBE FRAZIER 12FR DISP (TUBING) ×2 IMPLANT
SUPPORT WRAP ARM LG (MISCELLANEOUS) ×3 IMPLANT
SUT FIBERWIRE #2 38 REV NDL BL (SUTURE)
SUT MNCRL AB 4-0 PS2 18 (SUTURE) IMPLANT
SUT NYLON 3 0 (SUTURE) ×2 IMPLANT
SUT VIC AB 1 CT1 36 (SUTURE) ×3 IMPLANT
SUT VIC AB 2-0 CT1 27 (SUTURE) ×3
SUT VIC AB 2-0 CT1 TAPERPNT 27 (SUTURE) ×2 IMPLANT
SUT VIC AB 3-0 SH 18 (SUTURE) IMPLANT
SUT VIC AB 3-0 SH 8-18 (SUTURE) ×3 IMPLANT
SUTURE FIBERWR#2 38 REV NDL BL (SUTURE) ×4 IMPLANT
SWAB COLLECTION DEVICE MRSA (MISCELLANEOUS) IMPLANT
SWAB CULTURE ESWAB REG 1ML (MISCELLANEOUS) IMPLANT
SYR CONTROL 10ML LL (SYRINGE) ×3 IMPLANT
TAPE CLOTH SURG 6X10 WHT LF (GAUZE/BANDAGES/DRESSINGS) ×1 IMPLANT
TOWEL OR 17X26 10 PK STRL BLUE (TOWEL DISPOSABLE) ×6 IMPLANT
TOWEL OR NON WOVEN STRL DISP B (DISPOSABLE) ×6 IMPLANT
TOWER SMARTMIX MINI (MISCELLANEOUS) ×2 IMPLANT
WATER STERILE IRR 1000ML POUR (IV SOLUTION) ×6 IMPLANT

## 2021-02-10 NOTE — Transfer of Care (Signed)
Immediate Anesthesia Transfer of Care Note  Patient: John Ware  Procedure(s) Performed: RIGHT OPEN SHOULDER I & D, OPEN DISTAL CLAVICLE RESECTION (Right: Shoulder) IRRIGATION AND DEBRIDEMENT ABSCESS LEFT LATERAL CHEST WALL (Left: Chest)  Patient Location: PACU  Anesthesia Type:General  Level of Consciousness: drowsy and patient cooperative  Airway & Oxygen Therapy: Patient Spontanous Breathing and Patient connected to face mask oxygen  Post-op Assessment: Report given to RN and Post -op Vital signs reviewed and stable  Post vital signs: Reviewed and stable  Last Vitals:  Vitals Value Taken Time  BP 101/60 02/10/21 1145  Temp 37.2 C 02/10/21 1141  Pulse 95 02/10/21 1145  Resp 17 02/10/21 1145  SpO2 96 % 02/10/21 1145    Last Pain:  Vitals:   02/10/21 0006  TempSrc:   PainSc: 5       Patients Stated Pain Goal: 3 (02/09/21 2336)  Complications: No notable events documented.

## 2021-02-10 NOTE — Progress Notes (Signed)
PROGRESS NOTE    John Ware  EAV:[id number] DOB: [birth year] DOA: 01/31/2021 PCP: Sharion Balloon, FNP   Brief Narrative:  Patient is a 55 year old obese Caucasian male with past medical history significant for but not limited to chronic pain, hypertension, diabetes mellitus type 2 as well as other comorbidities who presented with weakness.  History is obtained from the wife as he is a poor historian but she reported that he had been generally unwell for the past 3 weeks or so.  He had at home positive COVID test on 01/12/2021.  He had no respiratory symptoms at this time but he did have fevers that was responsive to Tylenol.  Reportedly he apparently fell out of his chair and hurt his left shoulder and he was evaluated by the ED and they found no fracture and he was given a sling and told to follow-up with orthopedics.  Since that time the patient's wife reports that he has had general body aches and pain and has increased symptoms that are not responsive to his chronic pain meds.  He is also had poor appetite during this time and has been unable to walk for last few days.  Because of these constantly some symptoms he is brought to the ED for further evaluation.  In the ED is found to have a sodium of 113 and a glucose of 339 and a WBC of 20.5 as well as a lactic acid of 3.8.  He started on broad-spectrum antibiotics and given fluids.  Soon after his blood cultures became positive for 4-4 MSSA.  He was transitioned to IV Ancef.  With further work-up and confirmation he was also found to have a large fluid collection of left lateral posterior chest and back.  He did have tenderness in his back.  He underwent MRI T-spine and CT of the chest abdomen pelvis.  There is no evidence of any osteomyelitis or discitis or other underlying infection on MRI T-spine.  CT of the chest did show a large multiloculated fluid collection.  IR was consulted and he underwent an ultrasound-guided aspiration was sent for  culturing.  CT was concerning for evidence of septic emboli but 2D echo did not reveal any evidence of an endocarditis.  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  Subsequently he still complained of some shoulder pain so he underwent bilateral MRIs of his shoulders and he is still having fevers.  At the left shoulder was concerned for septic arthritis and left axillary abscess.  Orthopedics was reconsulted as well as IR and the orthopedic surgeon is planning to do a washout tomorrow in edition IR is going to place a drain on the residual fluid collection that may exist after washout.  Because of the surgery ID changed the patient to nafcillin however recommending continuing cefazolin at this time. ESR and CRP and both are elevated.  Patient to go for Orthopedic Intervention was done yesterday evening as he had a left shoulder excisional debridement, deep abscess with arthrotomy and irrigation debridement of the joint with biceps tenotomy and excision of the necrotic rotator cuff.  Patient is not medically ready to be D/C'd to CIR.   Postoperatively he had a CT scan and repeated and showed a marked increase in size of the abscess collection at the left lateral chest wall with persistent abscess again seen in the subscapular extending cranial to the apex of  the left hemithorax.  Since he is post I&D it was unclear about how much of the increase in size of the abscess was due to the postsurgical changes progression of the abscess collection.  Patient did have some small bilateral pleural effusions and bibasilar atelectasis as well as reactive left axillary adenopathy.  Post surgery patient was given Decadron 10 mg last night.  Because of the changes on his repeat CT scan with a left chest wall abscess with gas on CT General surgery was consulted and they are planning on taking the patient back  to the OR to address his right shoulder along with a large left lateral chest wall abscess.  ID recommends continuing IV cefazolin   Assessment & Plan:   Active Problems:   Diabetes mellitus (HCC)   Hyponatremia   Pressure injury of skin   Bacteremia due to Staphylococcus aureus   HTN (hypertension)   Anxiety   Left shoulder pain   Ascending aorta dilatation (HCC)   Vision changes   Endocarditis  Severe sepsis ([place]),  - 2/2 bacteremia, improving slowly with multiple seeding areas - patient is still spiking temperatures but has been afebrile the last 24 hours -WBC is now trending down from 21.4 -> 18.8 -> 18.5 and is trending down further to 18.3 -ID recommends continuing Cefazolin  -See below for Surgeries    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 This would be start date of abx likely. Final rec's per ID for length of duration of Ancef, likely 6 weeks; he would be a candidate for a PICC and if accepted to CIR they could continue abx there if approved but he is not medically stable yet    Bacteremia due to Staphylococcus aureus - 4/4 blood cultures on admission growing MSSA -Repeated blood cultures on 02/01/2021; NGTD - TTE negative for vegetations; cardiology consulted for TEE, 02/05/21: POSITIVE for TV vegetation - now s/p IR drainage of left posterior chest wall fluid collection which was noted to be purulent in nature; culture also growing MSSA - MRI T spine 02/01/21 negative for OM/discitis  - MRI brain 6/12 negative for pathology in visual cortex to explain "floaters" in visual fields - continue ancef for now  - ID following and appreciate recc's recommending continuing cefazolin for now   Hyponatremia - Initially presumed due to dehydration and hypokalemia - severely low on admission, 113 -Improved now and is trending up to 132 yesterday and  today is 130 - will check tsh, repeat osm and urine osm, urine sodium. Cortisol wnl when checked earlier this admission, will repeat tomorrow with acth stim test. Reset osmostat possible - may need fluid restriction   Bilateral  shoulder pain - See work-up above - Was previously seen in the ER after his fall and recommended for a sling with outpatient Ortho follow-up -Bilateral shoulder x-rays unremarkable for now.   - MRI of shoulders to be performed today to eval for abscess/osteo; Left Shoulder MRI showed "Findings concerning for glenohumeral and acromioclavicular septic arthritis with septic subacromial/subdeltoid and  subcoracoid bursitis. No osteomyelitis. Large abscess in the left axilla, incompletely visualized. Full-thickness, partial width tear of the anterior supraspinatus tendon with 2.2 cm retraction to the top of  the humeral head. Additional high-grade partial-thickness articular surface tear of the posterior  supraspinatus tendon. High-grade partial-thickness articular surface tear of the superior subscapularis  tendon.  complete tear of the biceps long head tendon within the bicipital groove." -Right should MRI done showed "No convincing evidence of septic arthritis. Moderate amount of fluid in  the subacromial/subdeltoid bursa and subcoracoid bursa is favored related to underlying rotator cuff  pathology described below rather than septic bursitis, although fluid sampling could be obtained for  definitive diagnosis. Full-thickness, essentially full width tear of the supraspinatus tendon with 2.1 cm retraction to the top of the humeral head. No muscle atrophy. High-grade partial-thickness articular  surface tear of the mid to distal infraspinatus tendon. Partially torn long head biceps tendon within the  bicipital groove. Moderate acromioclavicular and mild glenohumeral osteoarthritis." -Orthopedic surgery evaluated and took the patient to the OR last night and addressed the left shoulder as  he had "Left shoulder excisional debridement, deep abscess, with arthrotomy and irrigation and debridement of the joint with biceps tenotomy and excision of necrotic rotator cuff" -Right shoulder was addressed today and patient underwent an "Right shoulder excisional debridement, including biceps tenolysis. Evacuation of purulent abscess, right subacromial space, as well as subcoracoid bursa. Right shoulder arthrotomy with irrigation and debridement of joint.  Right shoulder open distal clavicle resection"   Left Lateral Chest Wall Abscess -CT Scan showed "Marked increase in size of abscess collection at the LEFT lateral chest wall with persistent abscess again seen subscapular extending cranial to the apex of the LEFT hemithorax as described above. Since patient is post I & D of the chest wall abscess, it is uncertain how much of the increase in size of the abscess is due to postsurgical changes versus progression of abscess collection   Small BILATERAL pleural effusions and bibasilar atelectasis. Reactive LEFT axillary adenopathy." -General surgery evaluated and patient does have a large 17 x 16 x 6 cm fluid collection in the left lateral chest wall that does not have any communication to the actual thorax cavity.  General surgery feel this needs to be drained and he was taken to the OR by Dr. Redmond Pulling. -He is s/p "Incision and drainage of multiple left lateral chest wall abscesses along with excisional debridement of left lateral chest wall skin, soft tissue, muscle, and fascia and axilla."    Vision changes - Patient now stating on 02/04/2021 that he has been seeing "black floaters" in his field of vision for several days.  When questioned why he did not bring this up sooner, he did not have a good answer and did not want to "trouble [place]" - ophtho evaluated 6/13, no endophthalmitis   DKA (diabetic ketoacidosis) (HCC)-resolved as of 02/02/2021 - present on admission - responded to fluids and  insulin - now resolved -Lantus 15 units subcu daily as well as moderate NovoLog sliding scale insulin before meals and at bedtime and will add 3 units of NovoLog 3 times daily if the patient eats greater than 50% of his meals -He is given IV Decadron once last night his blood sugars went up so we will need to continue monitor carefully as CBGs are now ranging from 127-294   Ascending aorta dilatation (HCC) - TTE on 6/10 notes mild dilation of ascending aorta, 38 mm - follow TEE results as well: normal thoracic and ascending aorta noted on 02/05/21  Hyperkalemia -Patient was given a dose of Lokelma -Patient's potassium this morning was 5.2 and improved to 4.9 -Continue to monitor and trend and repeat CMP in the a.m.   Anxiety - Wellbutrin and Zoloft on hold for now in setting of hyponatremia   HTN (  hypertension) - Pressure normal on admission.  Will treat if needed - Continue to Monitor BP per Protocol -Last BP was 98/60   Diabetes mellitus (HCC) - Continue SSI and CBG monitoring -C/w Lantus 15 units subcu daily as well as moderate NovoLog sliding scale insulin before meals and at bedtime and will add 3 units of NovoLog 3 times daily if the patient eats greater than 50% of his meals - A1c 10.2% on 01/31/2021 -CBGs ranging from 488-891   Obesity -Complicates overall prognosis and care -Estimated body mass index is 30.78 kg/m as calculated from the following:   Height as of this encounter: $RemoveBeforeD'5\' 10"'[id number]$  (1.778 m).   Weight as of this encounter: 97.3 kg. -Weight Loss and Dietary Counseling given   Right Knee Pain -Obtained X-Ray and showed "Medial joint space narrowing is noted with mild osteophytic change. Mild patellofemoral changes are noted as well. No joint effusion is seen. No acute fracture is noted."  Normocytic Anemia -Relatively Stable -Patient's Hgb/Hct went from 10.5/29.9 -> 10.8/31.0 -> 10.1/28.7 -> 9.6/28.1 -> 10.2/30.4 -> 9.4/27.9 -Check Anemia Panel in the AM  -Continue  to Monitor for S/Sx of Bleeding  -Repeat CBC in the AM   Thrombocytosis -Likely Reactive -Patient's Platelet Count went from 397 -> 414 -> 427 -> 444 -> 460 -> 536 -Continue to Monitor and Trend -Repeat CBC in the AM  DVT prophylaxis: Enoxaparin 40 mg subcu every 24 Code Status: FULL CODE  Family Communication: Discussed with Wife [name] when patient was in the OR  Disposition Plan: Pending further work-up and clearance by specialists; Will transfer to Medical Floor  Status is: Inpatient  Remains inpatient appropriate because:Unsafe d/c plan, IV treatments appropriate due to intensity of illness or inability to take PO, and Inpatient level of care appropriate due to severity of illness  Dispo: The patient is from: Home              Anticipated d/c is to: CIR vs SNF              Patient currently is not medically stable to d/c.   Difficult to place patient No  Consultants:  Infectious Diseases Orthopedic Surgery Interventional Radiology  CIR Cardiology  Procedures:  TEE Results: Normal LV size and function Normal RV size and function Normal RA Normal LA and LA appendage Abnormal TV with large filamentous highly mobile density predominantly on the ventricular side of the TV leaflets consistent with vegetation.  There is mild associated TR. Normal PV Normal MV with trivial MR Normal trileaflet AV with trivial AR Normal interatrial septum with no evidence of shunt by colorflow dopper Normal thoracic and ascending aorta.    PROCEDURE 02/08/21 by Dr. Mardelle Matte: Left shoulder excisional debridement, deep abscess, with arthrotomy and irrigation and debridement of the joint with biceps tenotomy and excision of necrotic rotator cuff  PROCEDURE 02/10/21 by Dr. Mardelle Matte:     1.  Right shoulder excisional debridement, including biceps tenolysis 2.  Evacuation of purulent abscess, right subacromial space, as well as subcoracoid bursa 3.  Right shoulder arthrotomy with irrigation and  debridement of joint 4.  Right shoulder open distal clavicle resection  PROCEDURE 02/10/21 by Dr. Redmond Pulling:  Incision and drainage of multiple left lateral chest wall abscesses along with excisional debridement of left lateral chest wall skin, soft tissue, muscle, and fascia and axilla.  Antimicrobials:  Anti-infectives (From admission, onward)    Start     Dose/Rate Route Frequency Ordered Stop   02/10/21 0727  ceFAZolin (  ANCEF) 2-4 GM/100ML-% IVPB       Note to Pharmacy: Darlys Gales   : cabinet override      02/10/21 0727 02/10/21 0813   02/01/21 0600  ceFAZolin (ANCEF) IVPB 2g/100 mL premix        2 g 200 mL/hr over 30 Minutes Intravenous Every 8 hours 02/01/21 0414     01/31/21 1800  cefTRIAXone (ROCEPHIN) 1 g in sodium chloride 0.9 % 100 mL IVPB  Status:  Discontinued        1 g 200 mL/hr over 30 Minutes Intravenous Every 24 hours 01/31/21 1453 02/01/21 0425   01/31/21 1115  vancomycin (VANCOREADY) IVPB 2000 mg/400 mL        2,000 mg 200 mL/hr over 120 Minutes Intravenous  Once 01/31/21 1105 01/31/21 1320   01/31/21 1045  ceFEPIme (MAXIPIME) 2 g in sodium chloride 0.9 % 100 mL IVPB        2 g 200 mL/hr over 30 Minutes Intravenous  Once 01/31/21 1035 01/31/21 1129   01/31/21 1045  metroNIDAZOLE (FLAGYL) IVPB 500 mg        500 mg 100 mL/hr over 60 Minutes Intravenous  Once 01/31/21 1035 01/31/21 1208   01/31/21 1045  vancomycin (VANCOCIN) IVPB 1000 mg/200 mL premix  Status:  Discontinued        1,000 mg 200 mL/hr over 60 Minutes Intravenous  Once 01/31/21 1035 01/31/21 1105        Subjective: Seen and examined at bedside in the PACU after surgery and he is a little somnolent and still complaining some pain.  Had some mild nausea.  No other concerns or complaints at this time.  Objective: Vitals:   02/10/21 1230 02/10/21 1245 02/10/21 1251 02/10/21 1302  BP: 120/75 120/72 123/70 98/60  Pulse: 95 92  91  Resp: $Remo'14 20  16  'Hpwsp$ Temp:   97.9 F (36.6 C) 98.8 F (37.1 C)   TempSrc:    Oral  SpO2: 100% 100%  96%  Weight:      Height:        Intake/Output Summary (Last 24 hours) at 02/10/2021 1654 Last data filed at 02/10/2021 1232 Gross per 24 hour  Intake 1970 ml  Output 1200 ml  Net 770 ml    Filed Weights   01/31/21 1400 02/05/21 0500 02/08/21 1540  Weight: 89.9 kg 97.3 kg 97.3 kg   Examination: Physical Exam:  Constitutional: WN/WD obese Caucasian male in NAD and appears calm but uncomfortable Eyes: Lids and conjunctivae normal, sclerae anicteric  ENMT: External Ears, Nose appear normal. Grossly normal hearing.  Neck: Appears normal, supple, no cervical masses, normal ROM, no appreciable thyromegaly; no JVD Respiratory: Diminished  to auscultation bilaterally, no wheezing, rales, rhonchi or crackles. Normal respiratory effort and patient is not tachypenic. No accessory muscle use. Wearing supplemental O2 via [place] Cardiovascular: Tachycardic Rate, no murmurs / rubs / gallops. S1 and S2 auscultated. 1+ LE edema Abdomen: Soft, non-tender, Distended. Bowel sounds positive.  GU: Deferred. Musculoskeletal: No clubbing / cyanosis of digits/nails. Bilateral Shoulders are immobilized  Skin: No rashes, lesions, ulcers on a limited skin evaluation. No induration; Warm and dry.  Neurologic: CN 2-12 grossly intact with no focal deficits. Romberg sign and cerebellar reflexes not assessed.  Psychiatric: Normal judgment and insight. Alert and oriented x 3. Normal mood and appropriate affect.   Data Reviewed: I have personally reviewed following labs and imaging studies  CBC: Recent Labs  Lab 02/06/21 1653 02/07/21 0805 02/08/21 0234 02/09/21 0502  02/10/21 0546  WBC 20.2* 21.4* 18.8* 18.5* 18.3*  NEUTROABS 17.4* 18.4* 15.8* 17.2* 15.7*  HGB 10.8* 10.1* 9.6* 10.2* 9.4*  HCT 31.0* 28.7* 28.1* 30.4* 27.9*  MCV 88.3 87.5 89.5 91.8 92.1  PLT 414* 427* 444* 460* 536*    Basic Metabolic Panel: Recent Labs  Lab 02/06/21 0239 02/07/21 0236 02/07/21 0805  02/08/21 0234 02/09/21 0502 02/10/21 0546  NA 125* 125*  --  128* 132* 130*  K 3.8 3.9  --  4.2 5.2* 4.9  CL 91* 91*  --  92* 96* 95*  CO2 26 29  --  $R'29 30 27  'Gl$ GLUCOSE 176* 113*  --  142* 405* 168*  BUN 8 8  --  $R'7 13 10  'Ln$ CREATININE 0.47* 0.43*  --  0.44* 0.69 0.68  CALCIUM 7.6* 7.7*  --  7.9* 8.0* 7.8*  MG 2.1  --  2.1 2.0 1.9 1.8  PHOS 4.2  --  4.4 4.5 5.0* 3.6    GFR: Estimated Creatinine Clearance: 124.9 mL/min (by C-G formula based on SCr of 0.68 mg/dL). Liver Function Tests: Recent Labs  Lab 02/04/21 0300 02/08/21 0234 02/09/21 0502 02/10/21 0546  AST 43* 61* 35 35  ALT $Re'21 29 25 19  'EGV$ ALKPHOS 95 98 95 73  BILITOT 1.1 0.7 0.7 0.8  PROT 5.1* 5.3* 5.4* 5.0*  ALBUMIN 1.6* 1.8* 1.7* 1.8*    No results for input(s): LIPASE, AMYLASE in the last 168 hours. No results for input(s): AMMONIA in the last 168 hours. Coagulation Profile: No results for input(s): INR, PROTIME in the last 168 hours.  Cardiac Enzymes: No results for input(s): CKTOTAL, CKMB, CKMBINDEX, TROPONINI in the last 168 hours. BNP (last 3 results) No results for input(s): PROBNP in the last 8760 hours. HbA1C: No results for input(s): HGBA1C in the last 72 hours. CBG: Recent Labs  Lab 02/09/21 0820 02/09/21 1155 02/09/21 1641 02/09/21 2311 02/10/21 1225  GLUCAP 381* 447* 294* 127* 166*    Lipid Profile: No results for input(s): CHOL, HDL, LDLCALC, TRIG, CHOLHDL, LDLDIRECT in the last 72 hours. Thyroid Function Tests: No results for input(s): TSH, T4TOTAL, FREET4, T3FREE, THYROIDAB in the last 72 hours.  Anemia Panel: No results for input(s): VITAMINB12, FOLATE, FERRITIN, TIBC, IRON, RETICCTPCT in the last 72 hours. Sepsis Labs: Recent Labs  Lab 02/04/21 0739  LATICACIDVEN 1.5     Recent Results (from the past 240 hour(s))  Culture, blood (routine x 2)     Status: None   Collection Time: 02/01/21  2:20 PM   Specimen: BLOOD  Result Value Ref Range Status   Specimen Description   Final     BLOOD RIGHT ANTECUBITAL Performed at [facility] [address] [place], [place] [address]    Special Requests   Final    BOTTLES DRAWN AEROBIC AND ANAEROBIC Blood Culture adequate volume Performed at [facility] [address], [place], [place] [address]    Culture   Final    NO GROWTH 5 DAYS Performed at [facility], [place] [address], [place], [place] [address]    Report Status 02/06/2021 FINAL  Final  Culture, blood (routine x 2)     Status: None   Collection Time: 02/01/21  2:20 PM   Specimen: BLOOD RIGHT HAND  Result Value Ref Range Status   Specimen Description   Final    BLOOD RIGHT HAND Performed at [facility] [address], [place],  [address]  Special Requests   Final    BOTTLES DRAWN AEROBIC AND ANAEROBIC Blood Culture adequate volume Performed at [facility] [address] [place], [place] [address]    Culture   Final    NO GROWTH 5 DAYS Performed at [facility], [place] [address] [place], [place] [address]    Report Status 02/06/2021 FINAL  Final  Aerobic/Anaerobic Culture w Gram Stain (surgical/deep wound)     Status: None   Collection Time: 02/02/21  4:12 PM   Specimen: Abscess  Result Value Ref Range Status   Specimen Description   Final    ABSCESS SUBSCAPULAR Performed at [facility] [address], [place], [place] [address]    Special Requests   Final    NONE Performed at [facility], [place] [address] [place], [place] [address]    Gram Stain   Final    ABUNDANT WBC PRESENT, PREDOMINANTLY PMN ABUNDANT GRAM POSITIVE COCCI IN CLUSTERS IN TETRADS    Culture   Final    ABUNDANT STAPHYLOCOCCUS AUREUS NO ANAEROBES ISOLATED Performed at [facility], [place] [address], [place], [place] [address]    Report Status 02/07/2021 FINAL  Final   Organism ID, Bacteria STAPHYLOCOCCUS AUREUS  Final       Susceptibility   Staphylococcus aureus - MIC*    CIPROFLOXACIN >=8 RESISTANT Resistant     ERYTHROMYCIN >=8 RESISTANT Resistant     GENTAMICIN <=0.5 SENSITIVE Sensitive     OXACILLIN 0.5 SENSITIVE Sensitive     TETRACYCLINE <=1 SENSITIVE Sensitive     VANCOMYCIN <=0.5 SENSITIVE Sensitive     TRIMETH/SULFA <=10 SENSITIVE Sensitive     CLINDAMYCIN <=0.25 SENSITIVE Sensitive     RIFAMPIN <=0.5 SENSITIVE Sensitive     Inducible Clindamycin NEGATIVE Sensitive     * ABUNDANT STAPHYLOCOCCUS AUREUS  Aerobic/Anaerobic Culture w Gram Stain (surgical/deep wound)     Status: None (Preliminary result)   Collection Time: 02/08/21  6:48 PM   Specimen: Synovial, Left Shoulder; Body Fluid  Result Value Ref Range Status   Specimen Description   Final    SHOULDER LEFT Performed at [facility] [address] [place], [place] [address]    Special Requests   Final    NONE Performed at [facility], [place] [address], [place], [place] [address]    Gram Stain   Final    ABUNDANT WBC PRESENT,BOTH PMN AND MONONUCLEAR RARE GRAM POSITIVE COCCI IN PAIRS    Culture   Final    RARE STAPHYLOCOCCUS AUREUS SUSCEPTIBILITIES TO FOLLOW Performed at [facility], [place] [address], [place], [place] [address]    Report Status PENDING  Incomplete  Aerobic/Anaerobic Culture w Gram Stain (surgical/deep wound)     Status: None (Preliminary result)   Collection Time: 02/08/21  6:52 PM   Specimen: Joint, Other; Body Fluid  Result Value Ref Range Status   Specimen Description   Final    SHOULDER LEFT BONE CHIPS Performed at [facility] [address], [place], [place] [address]    Special Requests   Final    NONE Performed at [facility], [place] [address], [place], [place] [address]    Gram Stain NO WBC SEEN NO ORGANISMS SEEN   Final   Culture   Final    RARE STAPHYLOCOCCUS AUREUS CULTURE REINCUBATED FOR BETTER  GROWTH SUSCEPTIBILITIES TO FOLLOW Performed at [facility], [place]  [address], [place], [place] [address]    Report Status PENDING  Incomplete      RN Pressure Injury Documentation: Pressure Injury 01/31/21 Buttocks Right;Left;Posterior Stage 2 -  Partial thickness loss of dermis presenting as a shallow open injury with a red, pink wound bed without slough. (Active)  01/31/21 1546  Location: Buttocks  Location Orientation: Right;Left;Posterior  Staging: Stage 2 -  Partial thickness loss of dermis presenting as a shallow open injury with a red, pink wound bed without slough.  Wound Description (Comments):   Present on Admission: Yes     Pressure Injury (Active)     Location:   Location Orientation:   Staging:   Wound Description (Comments):   Present on Admission:      Estimated body mass index is 30.78 kg/m as calculated from the following:   Height as of this encounter: $RemoveBeforeD'5\' 10"'[id number]$  (1.778 m).   Weight as of this encounter: 97.3 kg.  Malnutrition Type:   Malnutrition Characteristics:   Nutrition Interventions:    Radiology Studies: CT CHEST WO CONTRAST  Result Date: 02/09/2021 CLINICAL DATA:  LEFT posterior shoulder/axillary abscess post I & D, leukocytosis EXAM: CT CHEST WITHOUT CONTRAST TECHNIQUE: Multidetector CT imaging of the chest was performed following the standard protocol without IV contrast. Sagittal and coronal MPR images reconstructed from axial data set. COMPARISON:  CT chest 02/01/2021 FINDINGS: Cardiovascular: Aorta normal caliber. Heart size normal. No pericardial effusion. Mediastinum/Nodes: Esophagus unremarkable. Base of cervical region normal appearance. Enlarged LEFT axillary lymph nodes identified. No additional intrathoracic adenopathy. Lungs/Pleura: Small BILATERAL pleural effusions with compressive atelectasis of the lower lobes. LEFT lower lobe nodule 10 mm diameter unchanged. Few calcified granulomata bilaterally. Mild central peribronchial  thickening. No infiltrate or pneumothorax. Additional lingular nodule 5 mm diameter image 85 unchanged. Subpleural nodule anterior LEFT upper lobe 6 mm diameter image 66 unchanged. Upper Abdomen: Within limitations of beam hardening artifacts from arms, upper abdomen unremarkable Musculoskeletal: Degenerative disc disease changes thoracic spine. Osseous structures intact. Complex collection LEFT axilla and chest wall, extends from anterior to posterior from above the LEFT apex through the inferior extent of the exam at the lateral upper abdomen. Eight collection contains gas and fluid consistent with abscess. Foci of fluid extend subscapular, superior to the LEFT first and second ribs, and anteriorly anterior to the glenohumeral joint and coracoid process. The portion of the collection subscapular is similar to the previous exam, 10.4 x 5.8 cm image 11. The lateral chest wall collection has markedly increased and now measures 17 cm AP, 6.3 cm transverse, and at least 16.2 cm craniocaudal. Foci of gas are identified retropectoral and RIGHT deep to the deltoid muscle. Gas is seen extending adjacent to the coracoid process. No definite osseous destruction. IMPRESSION: Marked increase in size of abscess collection at the LEFT lateral chest wall with persistent abscess again seen subscapular extending cranial to the apex of the LEFT hemithorax as described above. Since patient is post I & D of the chest wall abscess, it is uncertain how much of the increase in size of the abscess is due to postsurgical changes versus progression of abscess collection Small BILATERAL pleural effusions and bibasilar atelectasis. Reactive LEFT axillary adenopathy. Electronically Signed   By: Lavonia Dana M.D.   On: 02/09/2021 08:55   CT SHOULDER LEFT WO CONTRAST  Result Date: 02/09/2021 CLINICAL DATA:  Patient status post left shoulder, arthrotomy, irrigation and debridement of the joint. Biceps tenotomy and excision of necrotic rotator  cuff was also performed 02/08/2021. The  patient has infection may extend into the right chest. EXAM: CT OF THE UPPER LEFT EXTREMITY WITHOUT CONTRAST TECHNIQUE: Multidetector CT imaging of the upper left extremity was performed according to the standard protocol. COMPARISON:  MRI right shoulder 02/07/2021. FINDINGS: Bones/Joint/Cartilage CT is not sensitive for the detection of osteomyelitis. No bony destructive change or periosteal reaction is identified. The patient has moderate acromioclavicular osteoarthritis. There appears to be a small glenohumeral joint effusion. No fracture or dislocation. Ligaments Suboptimally assessed by CT. Muscles and Tendons Gas and fluid are seen in multiple muscles about the shoulder. Discrete measurement collections is difficult but a gas and fluid within and superior to the supraspinatus muscle measures approximately 6.8 cm transverse by 3.5 cm AP by 1.7 cm craniocaudal. Gas and fluid are also seen surrounding the scapula and extending into the intercostal spaces between the second and third ribs as seen on image 24 series 7 and the fourth and fifth ribs as seen on image 59 of series 7. Soft tissues As above. IMPRESSION: Extensive abscess in the left chest wall and intercostal spaces most consistent with multifocal pyomyositis as described above. Flocculent gas in musculature about the left shoulder is worrisome for residual abscess although recent surgery complicates evaluation. CT is not sensitive for the detection of osteomyelitis but no evidence of osteomyelitis is seen. These results were called by telephone at the time of interpretation on 02/09/2021 at 9:17 am to provider [facility] , who verbally acknowledged these results. Electronically Signed   By: Inge Rise M.D.   On: 02/09/2021 09:20   DG Chest Port 1 View  Result Date: 02/10/2021 CLINICAL DATA:  Status post right shoulder I and D. EXAM: PORTABLE CHEST 1 VIEW COMPARISON:  January 31, 2021 FINDINGS:  Cardiomediastinal silhouette is normal. Mediastinal contours appear intact. There is no evidence of focal airspace consolidation, pleural effusion or pneumothorax. Interval resection of the distal right clavicle. Overlying soft tissue swelling and emphysema. IMPRESSION: Interval resection of the distal right clavicle. Overlying soft tissue swelling and emphysema. Electronically Signed   By: Fidela Salisbury M.D.   On: 02/10/2021 13:47    Scheduled Meds:  chlorhexidine  60 mL Topical Once   Chlorhexidine Gluconate Cloth  6 each Topical Daily   enoxaparin (LOVENOX) injection  40 mg Subcutaneous Q24H   gabapentin  800 mg Oral TID   insulin aspart  0-15 Units Subcutaneous TID WC   insulin aspart  0-5 Units Subcutaneous QHS   insulin aspart  3 Units Subcutaneous TID WC   insulin glargine  15 Units Subcutaneous Daily   mouth rinse  15 mL Mouth Rinse BID   oxyCODONE  20 mg Oral Q12H   polyethylene glycol  17 g Oral Daily   povidone-iodine  2 application Topical Once   senna-docusate  1 tablet Oral BID   Continuous Infusions:  sodium chloride Stopped (02/07/21 0508)    ceFAZolin (ANCEF) IV 2 g (02/09/21 2335)    LOS: 10 days   Kerney Elbe, DO [facility] PAGER is on AMION  If 7PM-7AM, please contact night-coverage [email or web address]

## 2021-02-10 NOTE — Interval H&P Note (Signed)
History and Physical Interval Note:  02/10/2021 7:36 AM  John Ware  has presented today for surgery, with the diagnosis of Sectic Arthritis.  The various methods of treatment have been discussed with the patient and family. After consideration of risks, benefits and other options for treatment, the patient has consented to  Procedure(s): RIGHT OPEN SHOULDER I & D (Right) IRRIGATION AND DEBRIDEMENT ABSCESS LEFT LATERAL CHEST WALL (Left) as a surgical intervention.  The patient's history has been reviewed, patient examined, no change in status, stable for surgery.  I have reviewed the patient's chart and labs.  Questions were answered to the patient's satisfaction.    Mary Sella. Andrey Campanile, MD, FACS General, Bariatric, & Minimally Invasive Surgery [facility] [place]   [name]

## 2021-02-10 NOTE — Op Note (Signed)
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  Right shoulder excisional debridement, including biceps tenolysis 2.  Evacuation of purulent abscess, right subacromial space, as well as subcoracoid bursa 3.  Right shoulder arthrotomy with irrigation and debridement of joint 4.  Right shoulder open distal clavicle resection  SURGEON:  Eulas Post, MD  PHYSICIAN ASSISTANT: Janine Ores, PA-C, present and scrubbed throughout the case, critical for completion in a timely fashion, and for retraction, instrumentation, and closure.  ANESTHESIA:   General  PREOPERATIVE INDICATIONS:  John Ware is a  55 y.o. male who has disseminated MSSA, and initially the read on the right shoulder MRI was that it did not appear as if there was significant sepsis, however he had significant pain, and over read confirmed that there was significant purulence as well as involvement of the acromioclavicular joint.  He elected for surgical management.  The risks benefits and alternatives were discussed with the patient preoperatively including but not limited to the risks of infection, bleeding, nerve injury, cardiopulmonary complications, the need for revision surgery, among others, and the patient was willing to proceed.  ESTIMATED BLOOD LOSS: 100 mL  OPERATIVE IMPLANTS: None  OPERATIVE FINDINGS: He had substantial purulence deep to the conjoined tendon tracking medially, as well as deep to the subscapularis tracking medially, and extending from the joint through a 2 x 3 cm supraspinatus tear.  He also had purulence overlying the acromioclavicular joint, with tracking down into that distal clavicle region as well.  The biceps tendon was not quite as necrotic as the other side, but I did  resect the intra-articular portion of it, as it was significantly frayed and involved with the infection.  OPERATIVE PROCEDURE: The patient was brought to the operating room and placed in supine position.  General anesthesia was administered.  He already was on IV antibiotics.  He was positioned in the beachchair position and the right upper extremity prepped and draped in usual sterile fashion.  He had 2 IVs in his upper extremity, we remove the most proximal 1, and then draped the more distal one out using sterile technique.  Timeout was performed and a deltopectoral approach was carried out with extension up overlying the acromioclavicular joint.  I dissected down, and once I was able to mobilize between the subscapularis and conjoined tendon I expressed substantial purulence.  Getting this fascial plane was not particularly easy, as it was already somewhat adherent.  I internally and externally rotated the humerus, and used a Cobb elevator to abrade the surfaces of the bone as well as the tendon and bicipital sheath.  The bicipital sheath was somewhat prominent, and I opened the sheath and the tendon was degenerative, I also opened the rotator interval to gain access to the joint.  I excised the biceps proximally, and there was a large crescentic tear of the supraspinatus but the tendon still had the cable attached and so I did not detach the cable.  I irrigated the shoulder copiously with 6 L of fluid both in the joint, as well as anterior and posterior to the subscapularis.  I felt that I had adequately drained all of the major pockets of purulence with the exception of the acromioclavicular joint at this point.  I extended the dissection proximally leaving the deltoid  attached, and then when I came down onto the acromioclavicular joint from above there was significant purulence expressed.  I exposed the joint, and resected the distal clavicle removing 1 cm with a saw.  The remaining bone appeared to  be in good condition.  I then irrigated this copiously as well, and then went back and irrigated with Irrisept throughout the glenohumeral joint, the subscapularis recess, as well as the subacromial space, and also the acromioclavicular joint.  At the completion of all of this I irrigated 1 more round of saline, and then repaired the fascia overlying the acromioclavicular joint, followed by subcutaneous closure and nylon for the skin.  Sterile gauze was applied and the patient was then transferred to a different table, and the plan is for general surgery to perform excisional debridement of his left upper chest wall and axilla.  Please see Dr. Tawana Scale dictation for additional details.  He certainly may need additional surgical intervention, and we will likely repeat CAT scan imaging of both of his shoulders sometime next week to reassess for the possibility for abscess reaccumulation.  This is a tremendously aggressive infection, and may be extremely difficult to achieve long-term shoulder function.  Debridement type: Excisional Debridement  Side: right  Body Location: shoulder and AC joint   Tools used for debridement: scalpel, scissors, curette, and rongeur  Pre-debridement Wound size (cm):   Length: 0        Width: 0     Depth: 0   Post-debridement Wound size (cm):   Length: 0        Width: 0     Depth: 0   Debridement depth beyond dead/damaged tissue down to healthy viable tissue: yes  Tissue layer involved: skin, subcutaneous tissue, muscle / fascia, bone  Nature of tissue removed: Slough, Necrotic, Devitalized Tissue, Non-viable tissue, and Purulence  Irrigation volume: 9 liters     Irrigation fluid type: Irricept and normal saline   [name], MD

## 2021-02-10 NOTE — Op Note (Signed)
NAME: John Ware, John Ware MEDICAL RECORD NO: [id number] ACCOUNT NO: [id number] DATE OF BIRTH: 23-Aug-1966 FACILITY: [facility] LOCATION: [facility] PHYSICIAN: Mary Sella. Andrey Campanile, MD  Operative Report   DATE OF PROCEDURE: 02/10/2021  PREOPERATIVE DIAGNOSIS:  Left lateral chest wall and axilla abscess.  POSTOPERATIVE DIAGNOSIS:  Complex left lateral chest wall and axillary abscess.  PROCEDURE:  Incision and drainage of multiple left lateral chest wall abscesses along with excisional debridement of left lateral chest wall skin, soft tissue, muscle, and fascia and axilla.  SURGEON:  Mary Sella. Andrey Campanile, MD  There was a secondary procedure performed, which is separately documented by Dr. Dorthula Nettles.  ANESTHESIA:  General.  ESTIMATED BLOOD LOSS:  For my portion, approximately 50 mL.  SPECIMEN:  Cultures of the left lateral chest wall abscesses.  COMPLICATIONS:  None immediately apparent.  INDICATIONS FOR PROCEDURE:  The patient is a 55 year old gentleman who was admitted a few days ago with disseminated MSSA and he had multiple soft tissue abscesses involving the chest wall bilaterally and shoulder areas.  He was taken previously to the  OR by orthopedics for incision and drainage of his left shoulder, which was subsequently closed, and he was being brought back today for work on his right shoulder.  A followup CT demonstrated undrained abscesses along the left lateral chest wall and  axilla, and general surgery was consulted to provide management.  I saw the patient preoperatively and discussed the risks and benefits of the procedure along with the fact that he may need additional procedures, scarring, injury to surrounding  structures such as nerves or major blood vessels, perioperative cardiac and pulmonary events, blood clot formation, loss of skin, and potential chest tube placement in case it did extend into the thoracic cavity.  DESCRIPTION OF PROCEDURE:  He was taken to the OR 9 at [facility] and initially underwent general endotracheal anesthesia and orthopedics did their portion of the procedure, which is separately documented.  He underwent right shoulder  excisional debridement and evacuation of abscesses and right open distal clavicle resection.  Once orthopedics was closed and his wound was dressed, the patient was placed supine on the operating table and placed in the right lateral position with his  left arm over his head with the appropriate padding and arm board.  His left lateral chest wall and lateral back were prepped and draped in the usual standard surgical fashion.  A surgical timeout was performed.  He was on broad-spectrum IV antibiotics.  There was no significant cellulitis, but there was a bulge in the left lateral mid axillary line, probably at around T8.  I initially aspirated this area, got some purulent fluid.  I made an elliptical incision and went down through subcutaneous tissue.   It ended up got into an abscess cavity.  It extended more anterior up toward the front of the chest wall towards the pec area.  I ended up making a counterincision a little bit more medial in the axilla.  This was an elliptical incision, which was made  with a 10 blade sharply initially 4 cm x 4 cm.  The immediate subcutaneous tissue, fat was healthy, but deep down along the latissimus dorsi this was clearly some necrotic muscle.  There was large pus and nonviable tissue along the entire left lateral  ribcage.  The abscess cavity actually extended all the way up to the scapular area to the teres major and minor muscles and the deltoid muscles and also tracked posteriorly toward the spinal  region underneath the skin.  I ended up enlarging that incision  and taking out additional tissue in order to get down to the ribcage and do excisional debridement.  Using a rongeur, essentially debrided necrotic fascia, soft tissue, and muscle.  Again, it extended 15 cm superiorly toward the  scapula and 15 cm  posteriorly toward the back.  Ended up using pulsatile lavage and doing that for 3 liters.  Hemostasis was maintained throughout the procedure.  I made a counterincision about 8 cm toward the posterior midline, so I could thread a Penrose drain through  that and the main cavity that I created to allow ongoing proper drainage.  The Penrose was secured to itself with two 2-0 nylon sutures.  At this point, I evacuated what I felt to be the majority of frank purulence and nonviable tissue.  Again, the  patient may need additional procedures regarding the shoulder area because it clearly tracked up that area, but I was able to get my hand essentially up in that area and palpate and break up any tissue and evacuate pus from that area.  Two entire Kerlix  gauzes were packed into the large open wound and a separate piece of moist Kerlix was placed in the initial incision that I made in the lateral axilla.  ABDs and gauze pads were obtained.  The measurements of the larger wound were approximately 9 x 8 cm  with a depth of 8 cm.  It tracked 15 cm essentially in a circumferential manner in all directions.  All needle, instrument, and sponge counts were correct x2.      Tool used for debridement (curette, scapel, etc.)  scapel and rongeur   Frequency of surgical debridement.   initial   Measurement of total devitalized tissue (wound surface) before and after surgical debridement.   9x8x8cm, tracked subcutaneously 15 cm in several directions   Area and depth of devitalized tissue removed from wound.  9x8x8cm   Blood loss and description of tissue removed.  50cc, skin, fat, soft tissue, muscle, fascia    Evidence of the progress of the wound's response to treatment.  A.  Current wound volume (current dimensions and depth).  See above  B.  Presence (and extent of) of infection.  See above  C.  Presence (and extent of) of non viable tissue.  See above  D.  Other material in the wound that is  expected to inhibit healing.  N/a   Was there any viable tissue removed (measurements): no   The patient was extubated and taken to recovery room in stable condition.   [facility] D: 02/10/2021 11:38:18 am T: 02/10/2021 12:41:00 pm  JOB: [id number]/ [id number]

## 2021-02-10 NOTE — Anesthesia Procedure Notes (Signed)
Procedure Name: Intubation Date/Time: 02/10/2021 7:54 AM Performed by: Montel Clock, CRNA Pre-anesthesia Checklist: Patient identified, Emergency Drugs available, Suction available, Patient being monitored and Timeout performed Patient Re-evaluated:Patient Re-evaluated prior to induction Oxygen Delivery Method: Circle system utilized Preoxygenation: Pre-oxygenation with 100% oxygen Induction Type: IV induction Ventilation: Mask ventilation without difficulty and Oral airway inserted - appropriate to patient size Laryngoscope Size: Mac and 3 Grade View: Grade I Tube type: Oral Tube size: 7.5 mm Number of attempts: 1 Airway Equipment and Method: Stylet Placement Confirmation: ETT inserted through vocal cords under direct vision, positive ETCO2 and breath sounds checked- equal and bilateral Secured at: 23 cm Tube secured with: Tape Dental Injury: Teeth and Oropharynx as per pre-operative assessment

## 2021-02-10 NOTE — Plan of Care (Signed)
  Problem: Health Behavior/Discharge Planning: Goal: Ability to manage health-related needs will improve Outcome: Progressing   Problem: Activity: Goal: Risk for activity intolerance will decrease Outcome: Progressing   Problem: Pain Managment: Goal: General experience of comfort will improve Outcome: Progressing   Problem: Skin Integrity: Goal: Risk for impaired skin integrity will decrease Outcome: Progressing   Problem: Respiratory: Goal: Ability to maintain adequate ventilation will improve Outcome: Progressing

## 2021-02-10 NOTE — Progress Notes (Signed)
Patient seen and examined.  Reviewed right shoulder MR again with [facility] subspeciality radiologist who felt there was high suspicion for infection there too.  His left shoulder feels better now.  Plan for I&D right shoulder.   This procedure has been fully reviewed with the patient and written informed consent has been obtained.  Will likely need multiple other operations.  This will be combined with general surgery.  Appreciate their help on his left chest wall.

## 2021-02-10 NOTE — Anesthesia Postprocedure Evaluation (Signed)
Anesthesia Post Note  Patient: John Ware  Procedure(s) Performed: RIGHT OPEN SHOULDER I & D, OPEN DISTAL CLAVICLE RESECTION (Right: Shoulder) IRRIGATION AND DEBRIDEMENT ABSCESS LEFT LATERAL CHEST WALL (Left: Chest)     Patient location during evaluation: PACU Anesthesia Type: General Level of consciousness: awake and alert Pain management: pain level controlled Vital Signs Assessment: post-procedure vital signs reviewed and stable Respiratory status: spontaneous breathing, nonlabored ventilation, respiratory function stable and patient connected to nasal cannula oxygen Cardiovascular status: blood pressure returned to baseline and stable Postop Assessment: no apparent nausea or vomiting Anesthetic complications: no   No notable events documented.  Last Vitals:  Vitals:   02/10/21 1200 02/10/21 1215  BP: (!) 126/57 115/82  Pulse: 93 94  Resp: 13 20  Temp:    SpO2: 100% 94%    Last Pain:  Vitals:   02/10/21 1226  TempSrc:   PainSc: 7                  Peta Peachey

## 2021-02-10 NOTE — Brief Op Note (Signed)
02/10/2021  11:26 AM  PATIENT:  John Ware  55 y.o. male  PRE-OPERATIVE DIAGNOSIS:  Septic Arthritis; Left lateral chest wall/axilla abscess  POST-OPERATIVE DIAGNOSIS:  Septic Arthritis; complex left lateral chest wall/axilla abscess  PROCEDURE:  Procedure(s): RIGHT OPEN SHOULDER I & D, OPEN DISTAL CLAVICLE RESECTION (Right) - Dr Dion Saucier INCISION AND DRAINAGE OF MULTIPLE ABSCESSES & EXCISIONAL DEBRIDEMENT LEFT LATERAL CHEST WALL (skin, soft tissue, muscle and fascia )& AXILLA  (Left) - 9 x 8 x6 cm with scalpel and rongeur  SURGEON:  Surgeon(s) and Role: Panel 1:    [name], MD - Primary Panel 2:    [name], MD - Primary  PHYSICIAN ASSISTANT:   ASSISTANTS: none   ANESTHESIA:   general  EBL:  100 mL   BLOOD ADMINISTERED:none  DRAINS: Penrose drain in the left lateral abscess space    LOCAL MEDICATIONS USED:  NONE  SPECIMEN:  Source of Specimen:  cultures of left lateral chest wall  DISPOSITION OF SPECIMEN:   micro  COUNTS:  YES  TOURNIQUET:  * No tourniquets in log *  DICTATION: .Other Dictation: Dictation Number [id number]  PLAN OF CARE:  pt already admitted  PATIENT DISPOSITION:  PACU - hemodynamically stable.   Delay start of Pharmacological VTE agent (>24hrs) due to surgical blood loss or risk of bleeding: no  Mary Sella. Andrey Campanile, MD, FACS General, Bariatric, & Minimally Invasive Surgery [facility], [place]

## 2021-02-11 DIAGNOSIS — F419 Anxiety disorder, unspecified: Secondary | ICD-10-CM | POA: Diagnosis not present

## 2021-02-11 DIAGNOSIS — I77810 Thoracic aortic ectasia: Secondary | ICD-10-CM | POA: Diagnosis not present

## 2021-02-11 DIAGNOSIS — R7881 Bacteremia: Secondary | ICD-10-CM | POA: Diagnosis not present

## 2021-02-11 DIAGNOSIS — A419 Sepsis, unspecified organism: Secondary | ICD-10-CM | POA: Diagnosis not present

## 2021-02-11 LAB — COMPREHENSIVE METABOLIC PANEL
ALT: 15 U/L (ref 0–44)
AST: 25 U/L (ref 15–41)
Albumin: 1.9 g/dL — ABNORMAL LOW (ref 3.5–5.0)
Alkaline Phosphatase: 55 U/L (ref 38–126)
Anion gap: 7 (ref 5–15)
BUN: 8 mg/dL (ref 6–20)
CO2: 28 mmol/L (ref 22–32)
Calcium: 7.8 mg/dL — ABNORMAL LOW (ref 8.9–10.3)
Chloride: 97 mmol/L — ABNORMAL LOW (ref 98–111)
Creatinine, Ser: 0.42 mg/dL — ABNORMAL LOW (ref 0.61–1.24)
GFR, Estimated: >60 mL/min (ref >60)
Glucose, Bld: 137 mg/dL — ABNORMAL HIGH (ref 70–99)
Potassium: 4.6 mmol/L (ref 3.5–5.1)
Sodium: 132 mmol/L — ABNORMAL LOW (ref 135–145)
Total Bilirubin: 0.7 mg/dL (ref 0.3–1.2)
Total Protein: 4.9 g/dL — ABNORMAL LOW (ref 6.5–8.1)

## 2021-02-11 LAB — CBC WITH DIFFERENTIAL/PLATELET
Abs Immature Granulocytes: 0.12 10*3/uL — ABNORMAL HIGH (ref 0.00–0.07)
Basophils Absolute: 0 10*3/uL (ref 0.0–0.1)
Basophils Relative: 0 %
Eosinophils Absolute: 0 10*3/uL (ref 0.0–0.5)
Eosinophils Relative: 0 %
HCT: 24.4 % — ABNORMAL LOW (ref 39.0–52.0)
Hemoglobin: 7.9 g/dL — ABNORMAL LOW (ref 13.0–17.0)
Immature Granulocytes: 1 %
Lymphocytes Relative: 13 %
Lymphs Abs: 1.7 10*3/uL (ref 0.7–4.0)
MCH: 31.3 pg (ref 26.0–34.0)
MCHC: 32.4 g/dL (ref 30.0–36.0)
MCV: 96.8 fL (ref 80.0–100.0)
Monocytes Absolute: 0.5 10*3/uL (ref 0.1–1.0)
Monocytes Relative: 4 %
Neutro Abs: 10.6 10*3/uL — ABNORMAL HIGH (ref 1.7–7.7)
Neutrophils Relative %: 82 %
Platelets: 479 10*3/uL — ABNORMAL HIGH (ref 150–400)
RBC: 2.52 MIL/uL — ABNORMAL LOW (ref 4.22–5.81)
RDW: 15.8 % — ABNORMAL HIGH (ref 11.5–15.5)
WBC: 13 10*3/uL — ABNORMAL HIGH (ref 4.0–10.5)
nRBC: 0 % (ref 0.0–0.2)

## 2021-02-11 LAB — GLUCOSE, CAPILLARY
Glucose-Capillary: 130 mg/dL — ABNORMAL HIGH (ref 70–99)
Glucose-Capillary: 237 mg/dL — ABNORMAL HIGH (ref 70–99)
Glucose-Capillary: 96 mg/dL (ref 70–99)
Glucose-Capillary: 106 mg/dL — ABNORMAL HIGH (ref 70–99)

## 2021-02-11 LAB — PHOSPHORUS: Phosphorus: 4.3 mg/dL (ref 2.5–4.6)

## 2021-02-11 LAB — MAGNESIUM: Magnesium: 1.7 mg/dL (ref 1.7–2.4)

## 2021-02-11 MED ORDER — MORPHINE SULFATE (PF) 2 MG/ML IV SOLN
2.0000 mg | INTRAVENOUS | Status: DC | PRN
  Administered 2021-02-11 – 2021-02-16 (×29): 2 mg via INTRAVENOUS
  Filled 2021-02-11 (×30): qty 1

## 2021-02-11 NOTE — Progress Notes (Signed)
PROGRESS NOTE    John Ware  RSW:[id number] DOB: 26-Jul-1966 DOA: 01/31/2021 PCP: Sharion Balloon, FNP   Brief Narrative:  Patient is a 55 year old obese Caucasian male with past medical history significant for but not limited to chronic pain, hypertension, diabetes mellitus type 2 as well as other comorbidities who presented with weakness.  History is obtained from the wife as he is a poor historian but she reported that he had been generally unwell for the past 3 weeks or so.  He had at home positive COVID test on 01/12/2021.  He had no respiratory symptoms at this time but he did have fevers that was responsive to Tylenol.  Reportedly he apparently fell out of his chair and hurt his left shoulder and he was evaluated by the ED and they found no fracture and he was given a sling and told to follow-up with orthopedics.  Since that time the patient's wife reports that he has had general body aches and pain and has increased symptoms that are not responsive to his chronic pain meds.  He is also had poor appetite during this time and has been unable to walk for last few days.  Because of these constantly some symptoms he is brought to the ED for further evaluation.  In the ED is found to have a sodium of 113 and a glucose of 339 and a WBC of 20.5 as well as a lactic acid of 3.8.  He started on broad-spectrum antibiotics and given fluids.  Soon after his blood cultures became positive for 4-4 MSSA.  He was transitioned to IV Ancef.  With further work-up and confirmation he was also found to have a large fluid collection of left lateral posterior chest and back.  He did have tenderness in his back.  He underwent MRI T-spine and CT of the chest abdomen pelvis.  There is no evidence of any osteomyelitis or discitis or other underlying infection on MRI T-spine.  CT of the chest did show a large multiloculated fluid collection.  IR was consulted and he underwent an ultrasound-guided aspiration was sent for  culturing.  CT was concerning for evidence of septic emboli but 2D echo did not reveal any evidence of an endocarditis.  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  Subsequently he still complained of some shoulder pain so he underwent bilateral MRIs of his shoulders and he is still having fevers.  At the left shoulder was concerned for septic arthritis and left axillary abscess.  Orthopedics was reconsulted as well as IR and the orthopedic surgeon is planning to do a washout tomorrow in edition IR is going to place a drain on the residual fluid collection that may exist after washout.  Because of the surgery ID changed the patient to nafcillin however recommending continuing cefazolin at this time. ESR and CRP and both are elevated.  Patient underwent on 02/08/21 left shoulder excisional debridement, deep abscess with arthrotomy and irrigation debridement of the joint with biceps tenotomy and excision of the necrotic rotator cuff.  Patient is not medically ready to be D/C'd to CIR.   Postoperatively he had a CT scan and repeated and showed a marked increase in size of the abscess collection at the left lateral chest wall with persistent abscess again seen in the subscapular extending cranial to the apex of the left hemithorax.  Since he is post I&D it  was unclear about how much of the increase in size of the abscess was due to the postsurgical changes progression of the abscess collection.  Patient did have some small bilateral pleural effusions and bibasilar atelectasis as well as reactive left axillary adenopathy.  Post surgery patient was given Decadron 10 mg last night.  Because of the changes on his repeat CT scan with a left chest wall abscess with gas on CT General surgery was consulted and they are planning on taking the patient back to the OR to address his right shoulder along with  a large left lateral chest wall abscess and this was done 6/18.  ID recommends continuing IV cefazolin   Assessment & Plan:   Active Problems:   Diabetes mellitus (HCC)   Hyponatremia   Pressure injury of skin   Bacteremia due to Staphylococcus aureus   HTN (hypertension)   Anxiety   Left shoulder pain   Ascending aorta dilatation (HCC)   Vision changes   Endocarditis  Severe sepsis ([place]),  - 2/2 bacteremia, improving slowly with multiple seeding areas - patient is still spiking temperatures but has been afebrile the last 24 hours -WBC is now trending down from 21.4 -> 18.8 -> 18.5 -> 18.3 and is trending down further to 13.0 -ID recommends continuing Cefazolin  -See below for Surgeries    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 This would be start date of abx likely. Final rec's per ID for length of duration of Ancef, likely 6 weeks; he would be a candidate for a PICC and if accepted to CIR they could continue abx there if approved but he is not medically stable yet    Bacteremia due to Staphylococcus aureus - 4/4 blood cultures on admission growing MSSA -Repeated blood cultures on 02/01/2021; NGTD - TTE negative for vegetations; cardiology consulted for TEE, 02/05/21: POSITIVE for TV vegetation - now s/p IR drainage of left posterior chest wall fluid collection which was noted to be purulent in nature; culture also growing MSSA - MRI T spine 02/01/21 negative for OM/discitis  - MRI brain 6/12 negative for pathology in visual cortex to explain "floaters" in visual fields - continue ancef for now  - ID following and appreciate recc's recommending continuing cefazolin for now   Hyponatremia - Initially presumed due to dehydration and hypokalemia - severely low on admission, 113 -Improved now and is trending up to 132  - will check tsh, repeat osm  and urine osm, urine sodium. Cortisol wnl when checked earlier this admission, will repeat tomorrow with acth stim test. Reset osmostat possible - may need fluid restriction   Bilateral  shoulder pain - See work-up above - Was previously seen in the ER after his fall and recommended for a sling with outpatient Ortho follow-up -Bilateral shoulder x-rays unremarkable for now.   - MRI of shoulders to be performed today to eval for abscess/osteo; Left Shoulder MRI showed "Findings concerning for glenohumeral and acromioclavicular septic arthritis with septic subacromial/subdeltoid and  subcoracoid bursitis. No osteomyelitis. Large abscess in the left axilla, incompletely visualized. Full-thickness, partial width tear of the anterior supraspinatus tendon with 2.2 cm retraction to the top of  the humeral head. Additional high-grade partial-thickness articular surface tear of the posterior  supraspinatus tendon. High-grade partial-thickness articular surface tear of the superior subscapularis  tendon. complete tear of the biceps long head  tendon within the bicipital groove." -Right should MRI done showed "No convincing evidence of septic arthritis. Moderate amount of fluid in  the subacromial/subdeltoid bursa and subcoracoid bursa is favored related to underlying rotator cuff  pathology described below rather than septic bursitis, although fluid sampling could be obtained for  definitive diagnosis. Full-thickness, essentially full width tear of the supraspinatus tendon with 2.1 cm retraction to the top of the humeral head. No muscle atrophy. High-grade partial-thickness articular  surface tear of the mid to distal infraspinatus tendon. Partially torn long head biceps tendon within the  bicipital groove. Moderate acromioclavicular and mild glenohumeral osteoarthritis." -Orthopedic surgery evaluated and took the patient to the OR last night and addressed the left shoulder as he had "Left shoulder excisional  debridement, deep abscess, with arthrotomy and irrigation and debridement of the joint with biceps tenotomy and excision of necrotic rotator cuff" -Right shoulder was addressed today and patient underwent an "Right shoulder excisional debridement, including biceps tenolysis. Evacuation of purulent abscess, right subacromial space, as well as subcoracoid bursa. Right shoulder arthrotomy with irrigation and debridement of joint.  Right shoulder open distal clavicle resection" -Pain Control and will increase Morphine Frequency 2 mg from q4hprn to q2hprn Severe Pain  Left Lateral Chest Wall Abscess -CT Scan showed "Marked increase in size of abscess collection at the LEFT lateral chest wall with persistent abscess again seen subscapular extending cranial to the apex of the LEFT hemithorax as described above. Since patient is post I & D of the chest wall abscess, it is uncertain how much of the increase in size of the abscess is due to postsurgical changes versus progression of abscess collection   Small BILATERAL pleural effusions and bibasilar atelectasis. Reactive LEFT axillary adenopathy." -General surgery evaluated and patient does have a large 17 x 16 x 6 cm fluid collection in the left lateral chest wall that does not have any communication to the actual thorax cavity.  General surgery feel this needs to be drained and he was taken to the OR by Dr. Redmond Pulling 02/10/21. -He is s/p "Incision and drainage of multiple left lateral chest wall abscesses along with excisional debridement of left lateral chest wall skin, soft tissue, muscle, and fascia and axilla." -Surgery Planning on removing Dressings in the AM    Vision changes - Patient now stating on 02/04/2021 that he has been seeing "black floaters" in his field of vision for several days.  When questioned why he did not bring this up sooner, he did not have a good answer and did not want to "trouble [place]" - ophtho evaluated 6/13, no endophthalmitis   DKA  (diabetic ketoacidosis) (HCC)-resolved as of 02/02/2021 - present on admission - responded to fluids and insulin - now resolved -Lantus 15 units subcu daily as well as moderate NovoLog sliding scale insulin before meals and at bedtime and will add 3 units of NovoLog 3 times daily if the patient eats greater than 50% of his meals -He is given IV Decadron once last night his blood sugars went up so we will need to continue monitor carefully as CBGs are now ranging from 96-237   Ascending aorta dilatation (HCC) - TTE on 6/10 notes mild dilation of ascending aorta, 38 mm - follow TEE results as well: normal thoracic and ascending aorta noted on 02/05/21  Hyperkalemia -Patient was given a dose of Lokelma -Patient's potassium now 4.6 -Continue to monitor and trend and repeat CMP in the a.m.   Anxiety - Wellbutrin and Zoloft on  hold for now in setting of hyponatremia   HTN (hypertension) - Pressure normal on admission.  Will treat if needed - Continue to Monitor BP per Protocol -Last BP was 121/81   Diabetes mellitus (HCC) - Continue SSI and CBG monitoring -C/w Lantus 15 units subcu daily as well as moderate NovoLog sliding scale insulin before meals and at bedtime and will add 3 units of NovoLog 3 times daily if the patient eats greater than 50% of his meals - A1c 10.2% on 01/31/2021 -CBGs ranging from 09-326   Obesity -Complicates overall prognosis and care -Estimated body mass index is 30.78 kg/m as calculated from the following:   Height as of this encounter: 5' 10" (1.778 m).   Weight as of this encounter: 97.3 kg. -Weight Loss and Dietary Counseling given   Right Knee Pain -Obtained X-Ray and showed "Medial joint space narrowing is noted with mild osteophytic change. Mild patellofemoral changes are noted as well. No joint effusion is seen. No acute fracture is noted."  Normocytic Anemia -Relatively Stable -Patient's Hgb/Hct went from 10.5/29.9 -> 10.8/31.0 -> 10.1/28.7 ->  9.6/28.1 -> 10.2/30.4 -> 9.4/27.9 -> 7.9/24.4 -Check Anemia Panel in the AM  -Continue to Monitor for S/Sx of Bleeding  -Repeat CBC in the AM   Thrombocytosis -Likely Reactive -Patient's Platelet Count went from 397 -> 414 -> 427 -> 444 -> 460 -> 536 -> 479 -Continue to Monitor and Trend -Repeat CBC in the AM  DVT prophylaxis: Enoxaparin 40 mg subcu every 24 Code Status: FULL CODE  Family Communication: Discussed with Wife [name] and other family  Disposition Plan: Pending further work-up and clearance by specialists; Will transfer to Medical Floor  Status is: Inpatient  Remains inpatient appropriate because:Unsafe d/c plan, IV treatments appropriate due to intensity of illness or inability to take PO, and Inpatient level of care appropriate due to severity of illness  Dispo: The patient is from: Home              Anticipated d/c is to: CIR vs SNF              Patient currently is not medically stable to d/c.   Difficult to place patient No  Consultants:  Infectious Diseases Orthopedic Surgery Interventional Radiology  CIR Cardiology  Procedures:  TEE Results: Normal LV size and function Normal RV size and function Normal RA Normal LA and LA appendage Abnormal TV with large filamentous highly mobile density predominantly on the ventricular side of the TV leaflets consistent with vegetation.  There is mild associated TR. Normal PV Normal MV with trivial MR Normal trileaflet AV with trivial AR Normal interatrial septum with no evidence of shunt by colorflow dopper Normal thoracic and ascending aorta.    PROCEDURE 02/08/21 by Dr. Mardelle Matte: Left shoulder excisional debridement, deep abscess, with arthrotomy and irrigation and debridement of the joint with biceps tenotomy and excision of necrotic rotator cuff  PROCEDURE 02/10/21 by Dr. Mardelle Matte:     1.  Right shoulder excisional debridement, including biceps tenolysis 2.  Evacuation of purulent abscess, right subacromial  space, as well as subcoracoid bursa 3.  Right shoulder arthrotomy with irrigation and debridement of joint 4.  Right shoulder open distal clavicle resection  PROCEDURE 02/10/21 by Dr. Redmond Pulling:  Incision and drainage of multiple left lateral chest wall abscesses along with excisional debridement of left lateral chest wall skin, soft tissue, muscle, and fascia and axilla.  Antimicrobials:  Anti-infectives (From admission, onward)    Start  Dose/Rate Route Frequency Ordered Stop   02/10/21 0727  ceFAZolin (ANCEF) 2-4 GM/100ML-% IVPB       Note to Pharmacy: Darlys Gales   : cabinet override      02/10/21 0727 02/10/21 1856   02/01/21 0600  ceFAZolin (ANCEF) IVPB 2g/100 mL premix        2 g 200 mL/hr over 30 Minutes Intravenous Every 8 hours 02/01/21 0414     01/31/21 1800  cefTRIAXone (ROCEPHIN) 1 g in sodium chloride 0.9 % 100 mL IVPB  Status:  Discontinued        1 g 200 mL/hr over 30 Minutes Intravenous Every 24 hours 01/31/21 1453 02/01/21 0425   01/31/21 1115  vancomycin (VANCOREADY) IVPB 2000 mg/400 mL        2,000 mg 200 mL/hr over 120 Minutes Intravenous  Once 01/31/21 1105 01/31/21 1320   01/31/21 1045  ceFEPIme (MAXIPIME) 2 g in sodium chloride 0.9 % 100 mL IVPB        2 g 200 mL/hr over 30 Minutes Intravenous  Once 01/31/21 1035 01/31/21 1129   01/31/21 1045  metroNIDAZOLE (FLAGYL) IVPB 500 mg        500 mg 100 mL/hr over 60 Minutes Intravenous  Once 01/31/21 1035 01/31/21 1208   01/31/21 1045  vancomycin (VANCOCIN) IVPB 1000 mg/200 mL premix  Status:  Discontinued        1,000 mg 200 mL/hr over 60 Minutes Intravenous  Once 01/31/21 1035 01/31/21 1105        Subjective: Seen and examined at bedside and the patient was sitting in the chair and he is complaining of some pain significantly.  No nausea or vomiting.  States that he hurts to move all over.  No other concerns or complaints at this time.  Objective: Vitals:   02/10/21 1707 02/10/21 2118 02/11/21 0537  02/11/21 1251  BP: 98/68 113/77 112/72 121/81  Pulse: 84 87 89 97  Resp: _0 Temp: 98.5 F (36.9 C) 98.4 F (36.9 C) 98.7 F (37.1 C) 99.2 F (37.3 C)  TempSrc: Oral Oral Oral Oral  SpO2: 93% 94% 94% 97%  Weight:      Height:        Intake/Output Summary (Last 24 hours) at 02/11/2021 1726 Last data filed at 02/11/2021 1300 Gross per 24 hour  Intake 1500 ml  Output 1250 ml  Net 250 ml    Filed Weights   01/31/21 1400 02/05/21 0500 02/08/21 1540  Weight: 89.9 kg 97.3 kg 97.3 kg   Examination: Physical Exam:  Constitutional: WN/WD obese Caucasian male currently no acute distress appears calm but does appear uncomfortable and in some pain Eyes: Lids and conjunctivae normal, sclerae anicteric  ENMT: External Ears, Nose appear normal. Grossly normal hearing.  Neck: Appears normal, supple, no cervical masses, normal ROM, no appreciable thyromegaly; no JVD Respiratory: Diminished to auscultation bilaterally with coarse breath sounds, no wheezing, rales, rhonchi or crackles. Normal respiratory effort and patient is not tachypenic. No accessory muscle use Cardiovascular: RRR, no murmurs / rubs / gallops. S1 and S2 auscultated. 1+ LE Edema Abdomen: Soft, non-tender, Distended 2/2 body habitus. Bowel sounds positive.  GU: Deferred. Musculoskeletal: No clubbing / cyanosis of digits/nails. Bilateral Shoulders are dressed Skin: No rashes, lesions, ulcers on a limited skin evaluation. No induration; Warm and dry.  Neurologic: CN 2-12 grossly intact with no focal deficits. Romberg sign and cerebellar reflexes not assessed.  Psychiatric: Normal judgment and insight. Alert and oriented x 3.  Anxious mood and appropriate affect.   Data Reviewed: I have personally reviewed following labs and imaging studies  CBC: Recent Labs  Lab 02/07/21 0805 02/08/21 0234 02/09/21 0502 02/10/21 0546 02/11/21 0539  WBC 21.4* 18.8* 18.5* 18.3* 13.0*  NEUTROABS 18.4* 15.8* 17.2* 15.7* 10.6*   HGB 10.1* 9.6* 10.2* 9.4* 7.9*  HCT 28.7* 28.1* 30.4* 27.9* 24.4*  MCV 87.5 89.5 91.8 92.1 96.8  PLT 427* 444* 460* 536* 479*    Basic Metabolic Panel: Recent Labs  Lab 02/07/21 0236 02/07/21 0805 02/08/21 0234 02/09/21 0502 02/10/21 0546 02/11/21 0539  NA 125*  --  128* 132* 130* 132*  K 3.9  --  4.2 5.2* 4.9 4.6  CL 91*  --  92* 96* 95* 97*  CO2 29  --  _0 GLUCOSE 113*  --  142* 405* 168* 137*  BUN 8  --  _1 CREATININE 0.43*  --  0.44* 0.69 0.68 0.42*  CALCIUM 7.7*  --  7.9* 8.0* 7.8* 7.8*  MG  --  2.1 2.0 1.9 1.8 1.7  PHOS  --  4.4 4.5 5.0* 3.6 4.3    GFR: Estimated Creatinine Clearance: 124.9 mL/min (A) (by C-G formula based on SCr of 0.42 mg/dL (L)). Liver Function Tests: Recent Labs  Lab 02/08/21 0234 02/09/21 0502 02/10/21 0546 02/11/21 0539  AST 61* 35 35 25  ALT _2 ALKPHOS 98 95 73 55  BILITOT 0.7 0.7 0.8 0.7  PROT 5.3* 5.4* 5.0* 4.9*  ALBUMIN 1.8* 1.7* 1.8* 1.9*    No results for input(s): LIPASE, AMYLASE in the last 168 hours. No results for input(s): AMMONIA in the last 168 hours. Coagulation Profile: No results for input(s): INR, PROTIME in the last 168 hours.  Cardiac Enzymes: No results for input(s): CKTOTAL, CKMB, CKMBINDEX, TROPONINI in the last 168 hours. BNP (last 3 results) No results for input(s): PROBNP in the last 8760 hours. HbA1C: No results for input(s): HGBA1C in the last 72 hours. CBG: Recent Labs  Lab 02/10/21 1703 02/10/21 2113 02/11/21 0744 02/11/21 1200 02/11/21 1641  GLUCAP 274* 278* 130* 237* 96    Lipid Profile: No results for input(s): CHOL, HDL, LDLCALC, TRIG, CHOLHDL, LDLDIRECT in the last 72 hours. Thyroid Function Tests: No results for input(s): TSH, T4TOTAL, FREET4, T3FREE, THYROIDAB in the last 72 hours.  Anemia Panel: No results for input(s): VITAMINB12, FOLATE, FERRITIN, TIBC, IRON, RETICCTPCT in the last 72 hours. Sepsis Labs: No results for input(s): PROCALCITON,  LATICACIDVEN in the last 168 hours.   Recent Results (from the past 240 hour(s))  Aerobic/Anaerobic Culture w Gram Stain (surgical/deep wound)     Status: None   Collection Time: 02/02/21  4:12 PM   Specimen: Abscess  Result Value Ref Range Status   Specimen Description   Final    ABSCESS SUBSCAPULAR Performed at [facility] [address] [place], [place] [address]    Special Requests   Final    NONE Performed at [facility], [place] [address], [place], [place] [address]    Gram Stain   Final    ABUNDANT WBC PRESENT, PREDOMINANTLY PMN ABUNDANT GRAM POSITIVE COCCI IN CLUSTERS IN TETRADS    Culture   Final    ABUNDANT STAPHYLOCOCCUS AUREUS NO ANAEROBES ISOLATED Performed at [facility], [place] [address], [place], [place] [address]    Report Status 02/07/2021 FINAL  Final   Organism ID, Bacteria STAPHYLOCOCCUS AUREUS  Final      Susceptibility   Staphylococcus aureus - MIC*    CIPROFLOXACIN >=8 RESISTANT Resistant     ERYTHROMYCIN >=8 RESISTANT Resistant     GENTAMICIN <=0.5 SENSITIVE Sensitive     OXACILLIN 0.5 SENSITIVE Sensitive     TETRACYCLINE <=1 SENSITIVE Sensitive     VANCOMYCIN <=0.5 SENSITIVE Sensitive     TRIMETH/SULFA <=10 SENSITIVE Sensitive     CLINDAMYCIN <=0.25 SENSITIVE Sensitive     RIFAMPIN <=0.5 SENSITIVE Sensitive     Inducible Clindamycin NEGATIVE Sensitive     * ABUNDANT STAPHYLOCOCCUS AUREUS  Aerobic/Anaerobic Culture w Gram Stain (surgical/deep wound)     Status: None (Preliminary result)   Collection Time: 02/08/21  6:48 PM   Specimen: Synovial, Left Shoulder; Body Fluid  Result Value Ref Range Status   Specimen Description   Final    SHOULDER LEFT Performed at [facility] [address] [place] [address], [place], [place] [address]    Special Requests   Final    NONE Performed at [facility], [place] [address] [place], [place] [address]    Gram Stain   Final    ABUNDANT WBC  PRESENT,BOTH PMN AND MONONUCLEAR RARE GRAM POSITIVE COCCI IN PAIRS Performed at [facility], [place] [address] [place], [place] [address]    Culture RARE STAPHYLOCOCCUS AUREUS  Final   Report Status PENDING  Incomplete   Organism ID, Bacteria STAPHYLOCOCCUS AUREUS  Final      Susceptibility   Staphylococcus aureus - MIC*    CIPROFLOXACIN >=8 RESISTANT Resistant     ERYTHROMYCIN >=8 RESISTANT Resistant     GENTAMICIN <=0.5 SENSITIVE Sensitive     OXACILLIN 0.5 SENSITIVE Sensitive     TETRACYCLINE <=1 SENSITIVE Sensitive     VANCOMYCIN 1 SENSITIVE Sensitive     TRIMETH/SULFA <=10 SENSITIVE Sensitive     CLINDAMYCIN <=0.25 SENSITIVE Sensitive     RIFAMPIN <=0.5 SENSITIVE Sensitive     Inducible Clindamycin NEGATIVE Sensitive     * RARE STAPHYLOCOCCUS AUREUS  Aerobic/Anaerobic Culture w Gram Stain (surgical/deep wound)     Status: None (Preliminary result)   Collection Time: 02/08/21  6:52 PM   Specimen: Joint, Other; Body Fluid  Result Value Ref Range Status   Specimen Description   Final    SHOULDER LEFT BONE CHIPS Performed at [facility] [address], [place], [place] [address]    Special Requests   Final    NONE Performed at [facility], [place] [address] [place], [place] [address]    Gram Stain   Final    NO WBC SEEN NO ORGANISMS SEEN Performed at [facility], [place] [address] [place], [place] [address]    Culture RARE STAPHYLOCOCCUS AUREUS  Final   Report Status PENDING  Incomplete   Organism ID, Bacteria STAPHYLOCOCCUS AUREUS  Final      Susceptibility   Staphylococcus aureus - MIC*    CIPROFLOXACIN >=8 RESISTANT Resistant     ERYTHROMYCIN >=8 RESISTANT Resistant     GENTAMICIN <=0.5 SENSITIVE Sensitive     OXACILLIN 0.5 SENSITIVE Sensitive     TETRACYCLINE <=1 SENSITIVE Sensitive     VANCOMYCIN 1 SENSITIVE Sensitive     TRIMETH/SULFA <=10 SENSITIVE Sensitive     CLINDAMYCIN <=0.25 SENSITIVE Sensitive      RIFAMPIN <=0.5 SENSITIVE Sensitive     Inducible Clindamycin NEGATIVE Sensitive     * RARE STAPHYLOCOCCUS AUREUS  Aerobic/Anaerobic Culture w Gram Stain (surgical/deep  wound)     Status: None (Preliminary result)   Collection Time: 02/10/21 10:22 AM   Specimen: Wound  Result Value Ref Range Status   Specimen Description   Final    WOUND CHEST Performed at [facility] [address], [place], [place] [address]    Special Requests   Final    NONE Performed at [facility], [place] [address] [place], [place] [address]    Gram Stain   Final    ABUNDANT WBC PRESENT,BOTH PMN AND MONONUCLEAR MODERATE GRAM POSITIVE COCCI    Culture   Final    ABUNDANT STAPHYLOCOCCUS AUREUS SUSCEPTIBILITIES TO FOLLOW Performed at [facility], [place] [address] [place], [place] [address]    Report Status PENDING  Incomplete      RN Pressure Injury Documentation: Pressure Injury 01/31/21 Buttocks Right;Left;Posterior Stage 2 -  Partial thickness loss of dermis presenting as a shallow open injury with a red, pink wound bed without slough. (Active)  01/31/21 1546  Location: Buttocks  Location Orientation: Right;Left;Posterior  Staging: Stage 2 -  Partial thickness loss of dermis presenting as a shallow open injury with a red, pink wound bed without slough.  Wound Description (Comments):   Present on Admission: Yes     Pressure Injury (Active)     Location:   Location Orientation:   Staging:   Wound Description (Comments):   Present on Admission:      Estimated body mass index is 30.78 kg/m as calculated from the following:   Height as of this encounter: 5' 10" (1.778 m).   Weight as of this encounter: 97.3 kg.  Malnutrition Type:   Malnutrition Characteristics:   Nutrition Interventions:    Radiology Studies: DG Chest Port 1 View  Result Date: 02/10/2021 CLINICAL DATA:  Status post right shoulder I and D. EXAM: PORTABLE CHEST 1 VIEW COMPARISON:   January 31, 2021 FINDINGS: Cardiomediastinal silhouette is normal. Mediastinal contours appear intact. There is no evidence of focal airspace consolidation, pleural effusion or pneumothorax. Interval resection of the distal right clavicle. Overlying soft tissue swelling and emphysema. IMPRESSION: Interval resection of the distal right clavicle. Overlying soft tissue swelling and emphysema. Electronically Signed   By: Fidela Salisbury M.D.   On: 02/10/2021 13:47    Scheduled Meds:  chlorhexidine  60 mL Topical Once   Chlorhexidine Gluconate Cloth  6 each Topical Daily   enoxaparin (LOVENOX) injection  40 mg Subcutaneous Q24H   gabapentin  800 mg Oral TID   insulin aspart  0-15 Units Subcutaneous TID WC   insulin aspart  0-5 Units Subcutaneous QHS   insulin aspart  3 Units Subcutaneous TID WC   insulin glargine  15 Units Subcutaneous Daily   mouth rinse  15 mL Mouth Rinse BID   oxyCODONE  20 mg Oral Q12H   polyethylene glycol  17 g Oral Daily   povidone-iodine  2 application Topical Once   senna-docusate  1 tablet Oral BID   Continuous Infusions:  sodium chloride Stopped (02/07/21 0508)    ceFAZolin (ANCEF) IV 2 g (02/11/21 1527)    LOS: 11 days   Kerney Elbe, DO [facility] PAGER is on AMION  If 7PM-7AM, please contact night-coverage [email or web address]

## 2021-02-11 NOTE — Progress Notes (Signed)
1 Day Post-Op   Subjective/Chief Complaint: Doing ok appreciative of his care    Objective: Vital signs in last 24 hours: Temp:  [97.9 F (36.6 C)-98.9 F (37.2 C)] 98.7 F (37.1 C) (06/19 0537) Pulse Rate:  [84-95] 89 (06/19 0537) Resp:  [13-22] 18 (06/19 0537) BP: (96-126)/(57-82) 112/72 (06/19 0537) SpO2:  [79 %-100 %] 94 % (06/19 0537) Last BM Date: 02/09/21  Intake/Output from previous day: 06/18 0701 - 06/19 0700 In: 2270 [P.O.:720; I.V.:1000; IV Piggyback:550] Out: 1850 [Urine:1350; Blood:500] Intake/Output this shift: No intake/output data recorded.  Incision/Wound:dressings intact  serosanguinous  drainage noted and expected   Lab Results:  Recent Labs    02/10/21 0546 02/11/21 0539  WBC 18.3* 13.0*  HGB 9.4* 7.9*  HCT 27.9* 24.4*  PLT 536* 479*   BMET Recent Labs    02/10/21 0546 02/11/21 0539  NA 130* 132*  K 4.9 4.6  CL 95* 97*  CO2 27 28  GLUCOSE 168* 137*  BUN 10 8  CREATININE 0.68 0.42*  CALCIUM 7.8* 7.8*   PT/INR No results for input(s): LABPROT, INR in the last 72 hours. ABG No results for input(s): PHART, HCO3 in the last 72 hours.  Invalid input(s): PCO2, PO2  Studies/Results: DG Chest Port 1 View  Result Date: 02/10/2021 CLINICAL DATA:  Status post right shoulder I and D. EXAM: PORTABLE CHEST 1 VIEW COMPARISON:  January 31, 2021 FINDINGS: Cardiomediastinal silhouette is normal. Mediastinal contours appear intact. There is no evidence of focal airspace consolidation, pleural effusion or pneumothorax. Interval resection of the distal right clavicle. Overlying soft tissue swelling and emphysema. IMPRESSION: Interval resection of the distal right clavicle. Overlying soft tissue swelling and emphysema. Electronically Signed   By: Ted Mcalpine M.D.   On: 02/10/2021 13:47    Anti-infectives: Anti-infectives (From admission, onward)    Start     Dose/Rate Route Frequency Ordered Stop   02/10/21 0727  ceFAZolin (ANCEF) 2-4 GM/100ML-%  IVPB       Note to Pharmacy: Lyda Kalata   : cabinet override      02/10/21 0727 02/10/21 1856   02/01/21 0600  ceFAZolin (ANCEF) IVPB 2g/100 mL premix        2 g 200 mL/hr over 30 Minutes Intravenous Every 8 hours 02/01/21 0414     01/31/21 1800  cefTRIAXone (ROCEPHIN) 1 g in sodium chloride 0.9 % 100 mL IVPB  Status:  Discontinued        1 g 200 mL/hr over 30 Minutes Intravenous Every 24 hours 01/31/21 1453 02/01/21 0425   01/31/21 1115  vancomycin (VANCOREADY) IVPB 2000 mg/400 mL        2,000 mg 200 mL/hr over 120 Minutes Intravenous  Once 01/31/21 1105 01/31/21 1320   01/31/21 1045  ceFEPIme (MAXIPIME) 2 g in sodium chloride 0.9 % 100 mL IVPB        2 g 200 mL/hr over 30 Minutes Intravenous  Once 01/31/21 1035 01/31/21 1129   01/31/21 1045  metroNIDAZOLE (FLAGYL) IVPB 500 mg        500 mg 100 mL/hr over 60 Minutes Intravenous  Once 01/31/21 1035 01/31/21 1208   01/31/21 1045  vancomycin (VANCOCIN) IVPB 1000 mg/200 mL premix  Status:  Discontinued        1,000 mg 200 mL/hr over 60 Minutes Intravenous  Once 01/31/21 1035 01/31/21 1105       Assessment/Plan: s/p Procedure(s): RIGHT OPEN SHOULDER I & D, OPEN DISTAL CLAVICLE RESECTION (Right) IRRIGATION AND DEBRIDEMENT ABSCESS LEFT  LATERAL CHEST WALL (Left) Take down dressings in the AM  Dr Andrey Campanile to see in am   LOS: 11 days    John Ware John Ware John Ware 02/11/2021

## 2021-02-11 NOTE — Progress Notes (Signed)
Occupational Therapy Treatment Patient Details Name: John Ware MRN: [id number] DOB: 03-16-1966 Today's Date: 02/11/2021    History of present illness Patient is a 55 y.o. male presented to ED with weakness and per wife generally unwell over the past 3 weeks. He had a positive home COVID test on 01/12/21. He had no respiratory symptoms at the time. He just had fevers that were responsive to APAP. He self isolated at home until 01/20/21 when he was at a party and experienced a fall. bil shoulder and spine xrays negative.  MRI brain=Punctate acute or subacute infarct versus artifact in the high left  frontal lobe. Otherwise, no evidence of recent infarction.  Underwent bilateral MRIs of his shoulders and he is still having fevers.  At the left shoulder was concerned for septic arthritis and left axillary abscess.  Orthopedics was reconsulted as well as IR.  Pt s/p  Left shoulder excisional debridement, deep abscess, with arthrotomy and irrigation and debridement of the joint with biceps tenotomy and excision of necrotic rotator cuff on 02/08/21. Patient s/p Right shoulder excisional debridement, including biceps tenolysis, Evacuation of purulent abscess, right subacromial space, as well as subcoracoid bursa, Right shoulder arthrotomy with irrigation and debridement of joint, Right shoulder open distal clavicle resection on 6/18  PMH: significant of chronic pain, HTN, DM2, depression, spinal stenosis.   OT comments  Patient now s/p bilateral shoulder I+D as of 6/18 with orders stating WBAT in bilateral upper extremities. Patient medicated for pain prior to session however yells out and perseverates on pain throughout session. Encouraged to bend elbow to drink from cup in bed with straw which patient was able to do. Patient needing max A for trunk support to sit upright at edge of bed. Patient needing max encouragement to try and bend elbows/distal joints "it hurts!" With bed height elevated, education on  use of momentum and pushing through bilateral lower extremities patient able to power up to standing with hand held assist x2 then take few steps to recliner. Patient positioned for comfort with bilateral arms propped with pillows. Will continue to follow.   Follow Up Recommendations  CIR;SNF ((CIR if patient appeals denial))    Equipment Recommendations  3 in 1 bedside commode       Precautions / Restrictions Precautions Precautions: Fall Precaution Comments: B UE slings for comfort Required Braces or Orthoses: Sling Restrictions Weight Bearing Restrictions: No Other Position/Activity Restrictions: per orders 6/18 patient is WBAT B UEs       Mobility Bed Mobility Overal bed mobility: Needs Assistance Bed Mobility: Supine to Sit     Supine to sit: HOB elevated;Max assist     General bed mobility comments: patient able to bring legs off edge of bed, initial mod A at trunk however patient having significant difficulty weight shifting onto R buttock needing max A to gain balance    Transfers Overall transfer level: Needs assistance Equipment used: 2 person hand held assist Transfers: Sit to/from [employer] Sit to Stand: Min assist;+2 safety/equipment;+2 physical assistance;From elevated surface Stand pivot transfers: Min assist;+2 safety/equipment;+2 physical assistance       General transfer comment: patient needing max encouragement due to pain, distactible. cues to use momentum and bed height elevated patient able to power up to standing using legs. min x2 for safety/hand held assist to take few steps to recliner    Balance Overall balance assessment: Needs assistance Sitting-balance support: Feet supported Sitting balance-[name]: Fair Sitting balance - Comments: once hips positioned at  edge of bed patient able to rest with arms in lap without loss of balance   Standing balance support: Bilateral upper extremity supported Standing balance-Leahy  Scale: Poor Standing balance comment: bilateral hand held support in standing                           ADL either performed or assessed with clinical judgement   ADL Overall ADL's : Needs assistance/impaired Eating/Feeding: Set up;Bed level Eating/Feeding Details (indicate cue type and reason): encouraged patient to flex elbow in order to drink from cup with straw                 Lower Body Dressing: Total assistance;Bed level Lower Body Dressing Details (indicate cue type and reason): to don B socks Toilet Transfer: Minimal assistance;+2 for physical assistance;+2 for safety/equipment;Cueing for sequencing;Cueing for safety;Stand-pivot Toilet Transfer Details (indicate cue type and reason): hand held assistance at bilateral upper extremities. provided patient with cues to use momentum and power through legs to stand from elevated bed height. patient then able to take few sides steps to recliner chair with hand held assistance         Functional mobility during ADLs: Minimal assistance;+2 for physical assistance;+2 for safety/equipment;Cueing for safety;Cueing for sequencing General ADL Comments: patient needing max encouragement and redirection due to perseverating on pain      Cognition Arousal/Alertness: Awake/alert Behavior During Therapy: Anxious Overall Cognitive Status: Impaired/Different from baseline Area of Impairment: Problem solving                             Problem Solving: Slow processing;Requires verbal cues;Difficulty sequencing General Comments: mentation appears improved from last OT session however still becomes distracted and heavily perseverates on pain        Exercises Exercises: Other exercises Other Exercises Other Exercises: max encouragement provided to engage in AROM distally in upper extremities           Pertinent Vitals/ Pain       Pain Assessment: Faces Faces Pain Scale: Hurts worst Pain Location: B shoulders  and flank Pain Descriptors / Indicators: Guarding;Grimacing;Crying;Moaning;Sore;Operative site guarding Pain Intervention(s): Premedicated before session;Patient requesting pain meds-RN notified   Frequency  Min 2X/week        Progress Toward Goals  OT Goals(current goals can now be found in the care plan section)  Progress towards OT goals: Not progressing toward goals - comment (recent I+D with significant pain)  Acute Rehab OT Goals Patient Stated Goal: to regain strength OT Goal Formulation: With patient Time For Goal Achievement: 02/15/21 Potential to Achieve Goals: Good ADL Goals Pt Will Perform Grooming: with set-up;with supervision;standing Pt Will Transfer to Toilet: with min guard assist;ambulating;regular height toilet;grab bars Pt Will Perform Toileting - Clothing Manipulation and hygiene: with min guard assist;sit to/from stand Additional ADL Goal #1: Patient will tolerate gentle PROM to bilateral shoulder and demonstrate understanding of HEP within two weeks. Additional ADL Goal #2: Patient will demonstrate functional use of upper extremities with complaints of pain 3/10 or less within two weeks.  Plan Discharge plan remains appropriate    Co-evaluation                 AM-PAC OT "6 Clicks" Daily Activity     Outcome Measure   Help from another person eating meals?: A Little Help from another person taking care of personal grooming?: A Lot Help from another person  toileting, which includes using toliet, bedpan, or urinal?: A Lot Help from another person bathing (including washing, rinsing, drying)?: A Lot Help from another person to put on and taking off regular upper body clothing?: A Lot Help from another person to put on and taking off regular lower body clothing?: Total 6 Click Score: 12    End of Session    OT Visit Diagnosis: Unsteadiness on feet (R26.81);Other abnormalities of gait and mobility (R26.89);History of falling (Z91.81);Muscle  weakness (generalized) (M62.81);Pain Pain - Right/Left:  (bilateral) Pain - part of body: Shoulder   Activity Tolerance Patient limited by pain   Patient Left in chair;with call bell/phone within reach;with chair alarm set;with family/visitor present   Nurse Communication Mobility status;Patient requests pain meds        Time: 3646-8032 OT Time Calculation (min): 32 min  Charges: OT General Charges $OT Visit: 1 Visit OT Treatments $Self Care/Home Management : 23-37 mins  Marlyce Huge OT OT pager: [phone]   Carmelia Roller 02/11/2021, 12:09 PM

## 2021-02-12 DIAGNOSIS — I77810 Thoracic aortic ectasia: Secondary | ICD-10-CM | POA: Diagnosis not present

## 2021-02-12 DIAGNOSIS — F419 Anxiety disorder, unspecified: Secondary | ICD-10-CM | POA: Diagnosis not present

## 2021-02-12 DIAGNOSIS — A419 Sepsis, unspecified organism: Secondary | ICD-10-CM | POA: Diagnosis not present

## 2021-02-12 DIAGNOSIS — R7881 Bacteremia: Secondary | ICD-10-CM | POA: Diagnosis not present

## 2021-02-12 LAB — CBC WITH DIFFERENTIAL/PLATELET
Abs Immature Granulocytes: 0.15 10*3/uL — ABNORMAL HIGH (ref 0.00–0.07)
Basophils Absolute: 0.1 10*3/uL (ref 0.0–0.1)
Basophils Relative: 0 %
Eosinophils Absolute: 0 10*3/uL (ref 0.0–0.5)
Eosinophils Relative: 0 %
HCT: 26.9 % — ABNORMAL LOW (ref 39.0–52.0)
Hemoglobin: 8.8 g/dL — ABNORMAL LOW (ref 13.0–17.0)
Immature Granulocytes: 1 %
Lymphocytes Relative: 14 %
Lymphs Abs: 2.1 10*3/uL (ref 0.7–4.0)
MCH: 31 pg (ref 26.0–34.0)
MCHC: 32.7 g/dL (ref 30.0–36.0)
MCV: 94.7 fL (ref 80.0–100.0)
Monocytes Absolute: 0.6 10*3/uL (ref 0.1–1.0)
Monocytes Relative: 4 %
Neutro Abs: 12.6 10*3/uL — ABNORMAL HIGH (ref 1.7–7.7)
Neutrophils Relative %: 81 %
Platelets: 544 10*3/uL — ABNORMAL HIGH (ref 150–400)
RBC: 2.84 MIL/uL — ABNORMAL LOW (ref 4.22–5.81)
RDW: 15.6 % — ABNORMAL HIGH (ref 11.5–15.5)
WBC: 15.5 10*3/uL — ABNORMAL HIGH (ref 4.0–10.5)
nRBC: 0 % (ref 0.0–0.2)

## 2021-02-12 LAB — COMPREHENSIVE METABOLIC PANEL
ALT: 17 U/L (ref 0–44)
AST: 29 U/L (ref 15–41)
Albumin: 2 g/dL — ABNORMAL LOW (ref 3.5–5.0)
Alkaline Phosphatase: 61 U/L (ref 38–126)
Anion gap: 7 (ref 5–15)
BUN: 7 mg/dL (ref 6–20)
CO2: 30 mmol/L (ref 22–32)
Calcium: 8.2 mg/dL — ABNORMAL LOW (ref 8.9–10.3)
Chloride: 95 mmol/L — ABNORMAL LOW (ref 98–111)
Creatinine, Ser: 0.52 mg/dL — ABNORMAL LOW (ref 0.61–1.24)
GFR, Estimated: >60 mL/min (ref >60)
Glucose, Bld: 136 mg/dL — ABNORMAL HIGH (ref 70–99)
Potassium: 4.4 mmol/L (ref 3.5–5.1)
Sodium: 132 mmol/L — ABNORMAL LOW (ref 135–145)
Total Bilirubin: 0.9 mg/dL (ref 0.3–1.2)
Total Protein: 5.5 g/dL — ABNORMAL LOW (ref 6.5–8.1)

## 2021-02-12 LAB — MAGNESIUM: Magnesium: 1.8 mg/dL (ref 1.7–2.4)

## 2021-02-12 LAB — GLUCOSE, CAPILLARY
Glucose-Capillary: 135 mg/dL — ABNORMAL HIGH (ref 70–99)
Glucose-Capillary: 187 mg/dL — ABNORMAL HIGH (ref 70–99)
Glucose-Capillary: 231 mg/dL — ABNORMAL HIGH (ref 70–99)
Glucose-Capillary: 248 mg/dL — ABNORMAL HIGH (ref 70–99)

## 2021-02-12 LAB — PHOSPHORUS: Phosphorus: 4.1 mg/dL (ref 2.5–4.6)

## 2021-02-12 MED ORDER — MORPHINE SULFATE (PF) 2 MG/ML IV SOLN
2.0000 mg | Freq: Once | INTRAVENOUS | Status: AC
  Administered 2021-02-12: 2 mg via INTRAVENOUS

## 2021-02-12 MED ORDER — MAGNESIUM SULFATE 2 GM/50ML IV SOLN
2.0000 g | Freq: Once | INTRAVENOUS | Status: AC
  Administered 2021-02-12: 2 g via INTRAVENOUS
  Filled 2021-02-12: qty 50

## 2021-02-12 NOTE — Progress Notes (Signed)
[facility] Progress Note  2 Days Post-Op  Subjective: CC-  Having some pain in the left chest wall. Dressing have been getting saturated and overlying ABD pads changed.  Objective: Vital signs in last 24 hours: Temp:  [98.3 F (36.8 C)-100.9 F (38.3 C)] 98.3 F (36.8 C) (06/20 0541) Pulse Rate:  [84-99] 84 (06/20 0541) Resp:  [16-19] 16 (06/20 0541) BP: (110-121)/(73-81) 110/73 (06/20 0541) SpO2:  [91 %-100 %] 91 % (06/20 0541) Last BM Date: 02/09/21  Intake/Output from previous day: 06/19 0701 - 06/20 0700 In: 1980 [P.O.:1680; IV Piggyback:300] Out: 2750 [Urine:2750] Intake/Output this shift: Total I/O In: 240 [P.O.:240] Out: -   PE: Gen:  Alert, NAD, pleasant Pulm:  rate and effort normal Skin: no rashes noted, warm and dry Chest: left lateral chest wall wounds pictured below. Small wound clean and tunnels about 4cm/ no purulent drainage or cellulitis. Larger wound with penrose drain in place, measures about 9x8cm and tracks about 15cm in all directions, there is some thin purulent drainage, no significant cellulitis      Lab Results:  Recent Labs    02/11/21 0539 02/12/21 0535  WBC 13.0* 15.5*  HGB 7.9* 8.8*  HCT 24.4* 26.9*  PLT 479* 544*   BMET Recent Labs    02/11/21 0539 02/12/21 0535  NA 132* 132*  K 4.6 4.4  CL 97* 95*  CO2 28 30  GLUCOSE 137* 136*  BUN 8 7  CREATININE 0.42* 0.52*  CALCIUM 7.8* 8.2*   PT/INR No results for input(s): LABPROT, INR in the last 72 hours. CMP     Component Value Date/Time   NA 132 (L) 02/12/2021 0535   NA 137 10/09/2020 1155   K 4.4 02/12/2021 0535   CL 95 (L) 02/12/2021 0535   CO2 30 02/12/2021 0535   GLUCOSE 136 (H) 02/12/2021 0535   BUN 7 02/12/2021 0535   BUN 11 10/09/2020 1155   CREATININE 0.52 (L) 02/12/2021 0535   CALCIUM 8.2 (L) 02/12/2021 0535   PROT 5.5 (L) 02/12/2021 0535   PROT 7.6 10/09/2020 1155   ALBUMIN 2.0 (L) 02/12/2021 0535   ALBUMIN 4.7 10/09/2020 1155   AST 29  02/12/2021 0535   ALT 17 02/12/2021 0535   ALKPHOS 61 02/12/2021 0535   BILITOT 0.9 02/12/2021 0535   BILITOT 0.8 10/09/2020 1155   GFRNONAA >60 02/12/2021 0535   GFRAA 113 10/09/2020 1155   Lipase  No results found for: LIPASE     Studies/Results: DG Chest Port 1 View  Result Date: 02/10/2021 CLINICAL DATA:  Status post right shoulder I and D. EXAM: PORTABLE CHEST 1 VIEW COMPARISON:  January 31, 2021 FINDINGS: Cardiomediastinal silhouette is normal. Mediastinal contours appear intact. There is no evidence of focal airspace consolidation, pleural effusion or pneumothorax. Interval resection of the distal right clavicle. Overlying soft tissue swelling and emphysema. IMPRESSION: Interval resection of the distal right clavicle. Overlying soft tissue swelling and emphysema. Electronically Signed   By: Ted Mcalpine M.D.   On: 02/10/2021 13:47    Anti-infectives: Anti-infectives (From admission, onward)    Start     Dose/Rate Route Frequency Ordered Stop   02/10/21 0727  ceFAZolin (ANCEF) 2-4 GM/100ML-% IVPB       Note to Pharmacy: Lyda Kalata   : cabinet override      02/10/21 0727 02/10/21 1856   02/01/21 0600  ceFAZolin (ANCEF) IVPB 2g/100 mL premix        2 g 200 mL/hr over 30 Minutes Intravenous  Every 8 hours 02/01/21 0414     01/31/21 1800  cefTRIAXone (ROCEPHIN) 1 g in sodium chloride 0.9 % 100 mL IVPB  Status:  Discontinued        1 g 200 mL/hr over 30 Minutes Intravenous Every 24 hours 01/31/21 1453 02/01/21 0425   01/31/21 1115  vancomycin (VANCOREADY) IVPB 2000 mg/400 mL        2,000 mg 200 mL/hr over 120 Minutes Intravenous  Once 01/31/21 1105 01/31/21 1320   01/31/21 1045  ceFEPIme (MAXIPIME) 2 g in sodium chloride 0.9 % 100 mL IVPB        2 g 200 mL/hr over 30 Minutes Intravenous  Once 01/31/21 1035 01/31/21 1129   01/31/21 1045  metroNIDAZOLE (FLAGYL) IVPB 500 mg        500 mg 100 mL/hr over 60 Minutes Intravenous  Once 01/31/21 1035 01/31/21 1208   01/31/21  1045  vancomycin (VANCOCIN) IVPB 1000 mg/200 mL premix  Status:  Discontinued        1,000 mg 200 mL/hr over 60 Minutes Intravenous  Once 01/31/21 1035 01/31/21 1105        Assessment/Plan Complex left lateral chest wall and axillary abscess POD#2 S/p Incision and drainage of multiple left lateral chest wall abscesses along with excisional debridement of left lateral chest wall skin, soft tissue, muscle, and fascia and axilla 6/18 Dr. Andrey Campanile - cx with abundant staphylococcus aureus, report pending - ID following and managing antibiotics - Wound not recommend any further debridement at this time, will start dressing changes and monitor closely. - BID dressing changes: pack large and small wounds with saline dampened kerlex, apply dry ABD pad on top and secure with tape. Keep penrose drain in place.  ID - currently ancef 6/9>> FEN - regular diet VTE - lovenox Foley - none  Sepsis on admission, MSSA bacteremia Bilatearl shoulder abscess, s/p debridement 6/16 and 6/18 Endocarditis HTN DM Chronic back pain    LOS: 12 days    Franne Forts, [facility] 02/12/2021, 9:57 AM Please see Amion for pager number during day hours 7:00am-4:30pm

## 2021-02-12 NOTE — Progress Notes (Signed)
Physical Therapy Treatment Patient Details Name: John Ware MRN: [id number] DOB: 03/21/1966 Today's Date: 02/12/2021    History of Present Illness Patient is a 55 y.o. male presented to ED with weakness and per wife generally unwell over the past 3 weeks. He had a positive home COVID test on 01/12/21. He had no respiratory symptoms at the time. He just had fevers that were responsive to APAP. He self isolated at home until 01/20/21 when he was at a party and experienced a fall. bil shoulder and spine xrays negative.  MRI brain=Punctate acute or subacute infarct versus artifact in the high left  frontal lobe. Otherwise, no evidence of recent infarction.  Underwent bilateral MRIs of his shoulders and he is still having fevers.  At the left shoulder was concerned for septic arthritis and left axillary abscess.  Orthopedics was reconsulted as well as IR.  Pt s/p  Left shoulder excisional debridement, deep abscess, with arthrotomy and irrigation and debridement of the joint with biceps tenotomy and excision of necrotic rotator cuff on 02/08/21. Patient s/p Right shoulder excisional debridement, including biceps tenolysis, Evacuation of purulent abscess, right subacromial space, as well as subcoracoid bursa, Right shoulder arthrotomy with irrigation and debridement of joint, Right shoulder open distal clavicle resection on 6/18.  Pt also s/p Incision and drainage of multiple left lateral chest wall abscesses along with excisional debridement on 6/18. PMH: significant of chronic pain, HTN, DM2, depression, spinal stenosis.    PT Comments    Pt requiring more assist with bed mobility and standing due to incisional pain and limited active shoulder movement however enjoys being able to ambulate.  Pt reports limited shoulder movement s/p bil shoulder I&Ds.  Pt pleasant and very motivated.  Continue to recommend CIR upon d/c.    Follow Up Recommendations  CIR (CIR if appealing ins denial, SNF if not CIR)      Equipment Recommendations  Rolling walker with 5" wheels    Recommendations for Other Services       Precautions / Restrictions Precautions Precautions: Fall Precaution Comments: B UE slings for comfort Restrictions Other Position/Activity Restrictions: per orders 6/18 patient is WBAT B UEs    Mobility  Bed Mobility Overal bed mobility: Needs Assistance Bed Mobility: Supine to Sit     Supine to sit: HOB elevated;Max assist     General bed mobility comments: assist to bring trunk upright due to pain with use of shoulders, assist also to scoot to EOB    Transfers Overall transfer level: Needs assistance Equipment used: Rolling walker (2 wheeled) Transfers: Sit to/from Stand Sit to Stand: Min assist;From elevated surface         General transfer comment: assist for boost into standing, pt encouraged to use LEs to self assist as poor ability to assist with UEs due to pain and incisions, assist to control descent as well  Ambulation/Gait Ambulation/Gait assistance: Min guard Gait Distance (Feet): 400 Feet Assistive device: Rolling walker (2 wheeled) Gait Pattern/deviations: Step-through pattern;Decreased stride length;Trunk flexed     General Gait Details: cues for posture as pt tends to keep RW too far forward however this relieves back pain per pt, distance per pt preference, HR up to 140 bpm, standing rest break after 200 feet for breathing and HR decreased into 120s   Stairs             Wheelchair Mobility    Modified Rankin (Stroke Patients Only)       Balance  Cognition Arousal/Alertness: Awake/alert Behavior During Therapy: Anxious Overall Cognitive Status: Within Functional Limits for tasks assessed                                        Exercises      General Comments        Pertinent Vitals/Pain Pain Assessment: Faces Pain Score: 6  Pain Location: B  shoulders and left flank Pain Descriptors / Indicators: Guarding;Sore;Operative site guarding;Grimacing Pain Intervention(s): Monitored during session;Premedicated before session;Repositioned    Home Living                      Prior Function            PT Goals (current goals can now be found in the care plan section) Progress towards PT goals: Progressing toward goals    Frequency    Min 3X/week      PT Plan Current plan remains appropriate    Co-evaluation              AM-PAC PT "6 Clicks" Mobility   Outcome Measure  Help needed turning from your back to your side while in a flat bed without using bedrails?: A Lot Help needed moving from lying on your back to sitting on the side of a flat bed without using bedrails?: A Lot Help needed moving to and from a bed to a chair (including a wheelchair)?: A Lot Help needed standing up from a chair using your arms (e.g., wheelchair or bedside chair)?: A Lot Help needed to walk in hospital room?: A Little Help needed climbing 3-5 steps with a railing? : A Lot 6 Click Score: 13    End of Session Equipment Utilized During Treatment: Gait belt Activity Tolerance: Patient limited by pain Patient left: in chair;with call bell/phone within reach;with chair alarm set   PT Visit Diagnosis: Unsteadiness on feet (R26.81);History of falling (Z91.81);Difficulty in walking, not elsewhere classified (R26.2)     Time: 1308-6578 PT Time Calculation (min) (ACUTE ONLY): 30 min  Charges:  $Gait Training: 23-37 mins                    Thomasene Mohair PT, DPT Acute Rehabilitation Services Pager: [phone] Office: [phone]   Maida Sale E 02/12/2021, 4:03 PM

## 2021-02-12 NOTE — Progress Notes (Signed)
Subjective: 2 Days Post-Op s/p Procedure(s): RIGHT OPEN SHOULDER I & D, OPEN DISTAL CLAVICLE RESECTION IRRIGATION AND DEBRIDEMENT ABSCESS LEFT LATERAL CHEST WALL   Patient is alert, oriented. States pain in shoulders is moderate-severe, worse with movement and worse with left than right. + chills  Objective:  PE: VITALS:   Vitals:   02/11/21 2200 02/12/21 0541 02/12/21 1422 02/12/21 1425  BP: 118/73 110/73 126/78   Pulse: 99 84 (!) 114 (!) 103  Resp: 19 16 18    Temp: (!) 100.9 F (38.3 C) 98.3 F (36.8 C) 98.4 F (36.9 C)   TempSrc: Oral Oral Oral   SpO2: 100% 91% (!) 78% (!) 75%  Weight:      Height:       0-20 degrees of active forward flexion of right shoulder, 0-10 deg FF of left shoulder, limited by pain. No erythema. No drainage seen on bulky dressings. Distal sensation and capillary refill intact.   LABS  Results for orders placed or performed during the hospital encounter of 01/31/21 (from the past 24 hour(s))  Glucose, capillary     Status: None   Collection Time: 02/11/21  4:41 PM  Result Value Ref Range   Glucose-Capillary 96 70 - 99 mg/dL  Glucose, capillary     Status: Abnormal   Collection Time: 02/11/21 10:42 PM  Result Value Ref Range   Glucose-Capillary 106 (H) 70 - 99 mg/dL  CBC with Differential/Platelet     Status: Abnormal   Collection Time: 02/12/21  5:35 AM  Result Value Ref Range   WBC 15.5 (H) 4.0 - 10.5 K/uL   RBC 2.84 (L) 4.22 - 5.81 MIL/uL   Hemoglobin 8.8 (L) 13.0 - 17.0 g/dL   HCT 02/14/21 (L) 15.4 - 00.8 %   MCV 94.7 80.0 - 100.0 fL   MCH 31.0 26.0 - 34.0 pg   MCHC 32.7 30.0 - 36.0 g/dL   RDW 67.6 (H) 19.5 - 09.3 %   Platelets 544 (H) 150 - 400 K/uL   nRBC 0.0 0.0 - 0.2 %   Neutrophils Relative % 81 %   Neutro Abs 12.6 (H) 1.7 - 7.7 K/uL   Lymphocytes Relative 14 %   Lymphs Abs 2.1 0.7 - 4.0 K/uL   Monocytes Relative 4 %   Monocytes Absolute 0.6 0.1 - 1.0 K/uL   Eosinophils Relative 0 %   Eosinophils Absolute 0.0 0.0 - 0.5  K/uL   Basophils Relative 0 %   Basophils Absolute 0.1 0.0 - 0.1 K/uL   Immature Granulocytes 1 %   Abs Immature Granulocytes 0.15 (H) 0.00 - 0.07 K/uL  Comprehensive metabolic panel     Status: Abnormal   Collection Time: 02/12/21  5:35 AM  Result Value Ref Range   Sodium 132 (L) 135 - 145 mmol/L   Potassium 4.4 3.5 - 5.1 mmol/L   Chloride 95 (L) 98 - 111 mmol/L   CO2 30 22 - 32 mmol/L   Glucose, Bld 136 (H) 70 - 99 mg/dL   BUN 7 6 - 20 mg/dL   Creatinine, Ser 02/14/21 (L) 0.61 - 1.24 mg/dL   Calcium 8.2 (L) 8.9 - 10.3 mg/dL   Total Protein 5.5 (L) 6.5 - 8.1 g/dL   Albumin 2.0 (L) 3.5 - 5.0 g/dL   AST 29 15 - 41 U/L   ALT 17 0 - 44 U/L   Alkaline Phosphatase 61 38 - 126 U/L   Total Bilirubin 0.9 0.3 - 1.2 mg/dL   GFR, Estimated >  60 >60 mL/min   Anion gap 7 5 - 15  Magnesium     Status: None   Collection Time: 02/12/21  5:35 AM  Result Value Ref Range   Magnesium 1.8 1.7 - 2.4 mg/dL  Phosphorus     Status: None   Collection Time: 02/12/21  5:35 AM  Result Value Ref Range   Phosphorus 4.1 2.5 - 4.6 mg/dL  Glucose, capillary     Status: Abnormal   Collection Time: 02/12/21  7:48 AM  Result Value Ref Range   Glucose-Capillary 135 (H) 70 - 99 mg/dL  Glucose, capillary     Status: Abnormal   Collection Time: 02/12/21 11:44 AM  Result Value Ref Range   Glucose-Capillary 187 (H) 70 - 99 mg/dL  Glucose, capillary     Status: Abnormal   Collection Time: 02/12/21  4:31 PM  Result Value Ref Range   Glucose-Capillary 231 (H) 70 - 99 mg/dL    No results found.  Assessment/Plan: Bilateral shoulder septic arthritis - L shoulder irrigation and debridement 6/16 - R shoulder irrigation and debridement with distal clavicle resection 6/17  - WBAT bilateral shoulders, can use slings for comfort if he likes - daily dressing changes bilaterally - will continue to follow, may require repeat washouts in the future   Contact information:   Weekdays 8-5 Janine Ores, PA-C [phone]  A fter hours and holidays please check [email or web address] for group call information for [facility]  Armida Sans 02/12/2021, 4:38 PM

## 2021-02-12 NOTE — Progress Notes (Signed)
PROGRESS NOTE    John Ware  RSW:[id number] DOB: 1965-10-12 DOA: 01/31/2021 PCP: Sharion Balloon, FNP   Brief Narrative:  Patient is a 55 year old obese Caucasian male with past medical history significant for but not limited to chronic pain, hypertension, diabetes mellitus type 2 as well as other comorbidities who presented with weakness.  History is obtained from the wife as he is a poor historian but she reported that he had been generally unwell for the past 3 weeks or so.  He had at home positive COVID test on 01/12/2021.  He had no respiratory symptoms at this time but he did have fevers that was responsive to Tylenol.  Reportedly he apparently fell out of his chair and hurt his left shoulder and he was evaluated by the ED and they found no fracture and he was given a sling and told to follow-up with orthopedics.  Since that time the patient's wife reports that he has had general body aches and pain and has increased symptoms that are not responsive to his chronic pain meds.  He is also had poor appetite during this time and has been unable to walk for last few days.  Because of these constantly some symptoms he is brought to the ED for further evaluation.  In the ED is found to have a sodium of 113 and a glucose of 339 and a WBC of 20.5 as well as a lactic acid of 3.8.  He started on broad-spectrum antibiotics and given fluids.  Soon after his blood cultures became positive for 4-4 MSSA.  He was transitioned to IV Ancef.  With further work-up and confirmation he was also found to have a large fluid collection of left lateral posterior chest and back.  He did have tenderness in his back.  He underwent MRI T-spine and CT of the chest abdomen pelvis.  There is no evidence of any osteomyelitis or discitis or other underlying infection on MRI T-spine.  CT of the chest did show a large multiloculated fluid collection.  IR was consulted and he underwent an ultrasound-guided aspiration was sent for  culturing.  CT was concerning for evidence of septic emboli but 2D echo did not reveal any evidence of an endocarditis.  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  Subsequently he still complained of some shoulder pain so he underwent bilateral MRIs of his shoulders and he is still having fevers.  At the left shoulder was concerned for septic arthritis and left axillary abscess.  Orthopedics was reconsulted as well as IR and the orthopedic surgeon is planning to do a washout tomorrow in edition IR is going to place a drain on the residual fluid collection that may exist after washout.  Because of the surgery ID changed the patient to nafcillin however recommending continuing cefazolin at this time. ESR and CRP and both are elevated.  Patient underwent on 02/08/21 left shoulder excisional debridement, deep abscess with arthrotomy and irrigation debridement of the joint with biceps tenotomy and excision of the necrotic rotator cuff.  Patient is not medically ready to be D/C'd to CIR.   Postoperatively he had a CT scan and repeated and showed a marked increase in size of the abscess collection at the left lateral chest wall with persistent abscess again seen in the subscapular extending cranial to the apex of the left hemithorax.  Since he is post I&D it  was unclear about how much of the increase in size of the abscess was due to the postsurgical changes progression of the abscess collection.  Patient did have some small bilateral pleural effusions and bibasilar atelectasis as well as reactive left axillary adenopathy.  Post surgery patient was given Decadron 10 mg last night.  Because of the changes on his repeat CT scan with a left chest wall abscess with gas on CT General surgery was consulted and they are planning on taking the patient back to the OR to address his right shoulder along with  a large left lateral chest wall abscess and this was done 6/18.  ID recommends continuing IV cefazolin currently  On 02/12/21 he had his Dressing Changes done. Cx showing Abundant Staph Aureus only resistant to Ciprofloxacin and Erythomycin. Surgery recommending no further debridement at this time and start BID Dressing changes.   Assessment & Plan:   Active Problems:   Diabetes mellitus (HCC)   Hyponatremia   Pressure injury of skin   Bacteremia due to Staphylococcus aureus   HTN (hypertension)   Anxiety   Left shoulder pain   Ascending aorta dilatation (HCC)   Vision changes   Endocarditis  Severe sepsis ([place]),  - 2/2 bacteremia, improving slowly with multiple seeding areas - patient is still spiking temperatures intermittently and had a Fever overnight of 100.9; Will need to continue monitor Temperature Curve and CBC  -WBC is now trending down from 21.4 -> 18.8 -> 18.5 -> 18.3 -> 13.0 -> 15.5 -ID recommends continuing Cefazolin  -See below for Surgeries    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 This would be start date of abx likely. Final rec's per ID for length of duration of Ancef, likely 6 weeks; he would be a candidate for a PICC and if accepted to CIR they could continue abx there if approved but he is not medically stable yet    Bacteremia due to Staphylococcus aureus - 4/4 blood cultures on admission growing MSSA -Repeated blood cultures on 02/01/2021; NGTD - TTE negative for vegetations; cardiology consulted for TEE, 02/05/21: POSITIVE for TV vegetation - now s/p IR drainage of left posterior chest wall fluid collection which was noted to be purulent in nature; culture also growing MSSA - MRI T spine 02/01/21 negative for OM/discitis  - MRI brain 6/12 negative for pathology in visual cortex to explain "floaters" in visual fields - continue  ancef for now  - ID following and appreciate recc's recommending continuing cefazolin for now   Hyponatremia - Initially presumed due to dehydration and hypokalemia - severely low on admission, 113 -Improved now and is trending up; remains 132 again today  - will check tsh, repeat osm and urine osm, urine sodium. Cortisol wnl when checked earlier this admission, will repeat tomorrow with acth stim test. Reset osmostat possible - may need fluid restriction   Bilateral  shoulder pain - See work-up above - Was previously seen in the ER after his fall and recommended for a sling with outpatient Ortho follow-up -Bilateral shoulder x-rays unremarkable for now.   - MRI of shoulders to be performed today to eval for abscess/osteo; Left Shoulder MRI showed "Findings concerning for glenohumeral and acromioclavicular septic arthritis with septic subacromial/subdeltoid and  subcoracoid bursitis. No osteomyelitis. Large abscess in the left axilla, incompletely visualized. Full-thickness, partial width tear of the anterior supraspinatus  tendon with 2.2 cm retraction to the top of  the humeral head. Additional high-grade partial-thickness articular surface tear of the posterior  supraspinatus tendon. High-grade partial-thickness articular surface tear of the superior subscapularis  tendon. complete tear of the biceps long head tendon within the bicipital groove." -Right should MRI done showed "No convincing evidence of septic arthritis. Moderate amount of fluid in  the subacromial/subdeltoid bursa and subcoracoid bursa is favored related to underlying rotator cuff  pathology described below rather than septic bursitis, although fluid sampling could be obtained for  definitive diagnosis. Full-thickness, essentially full width tear of the supraspinatus tendon with 2.1 cm retraction to the top of the humeral head. No muscle atrophy. High-grade partial-thickness articular  surface tear of the mid to distal infraspinatus  tendon. Partially torn long head biceps tendon within the  bicipital groove. Moderate acromioclavicular and mild glenohumeral osteoarthritis." -Orthopedic surgery evaluated and took the patient to the OR last night and addressed the left shoulder as he had "Left shoulder excisional debridement, deep abscess, with arthrotomy and irrigation and debridement of the joint with biceps tenotomy and excision of necrotic rotator cuff" -Right shoulder was addressed today and patient underwent an "Right shoulder excisional debridement, including biceps tenolysis. Evacuation of purulent abscess, right subacromial space, as well as subcoracoid bursa. Right shoulder arthrotomy with irrigation and debridement of joint.  Right shoulder open distal clavicle resection" -Pain Control and will increase Morphine Frequency 2 mg from q4hprn to q2hprn Severe Pain  Left Lateral Chest Wall Abscess -CT Scan showed "Marked increase in size of abscess collection at the LEFT lateral chest wall with persistent abscess again seen subscapular extending cranial to the apex of the LEFT hemithorax as described above. Since patient is post I & D of the chest wall abscess, it is uncertain how much of the increase in size of the abscess is due to postsurgical changes versus progression of abscess collection   Small BILATERAL pleural effusions and bibasilar atelectasis. Reactive LEFT axillary adenopathy." -General surgery evaluated and patient does have a large 17 x 16 x 6 cm fluid collection in the left lateral chest wall that does not have any communication to the actual thorax cavity.  General surgery feel this needs to be drained and he was taken to the OR by Dr. Redmond Pulling 02/10/21. -He is s/p "Incision and drainage of multiple left lateral chest wall abscesses along with excisional debridement of left lateral chest wall skin, soft tissue, muscle, and fascia and axilla." -Surgery did dressing change this AM on 02/12/21 he had his Dressing Changes  done. Cx showing Abundant Staph Aureus but Sensitivities pending and likely MSSA. Surgery recommending no further debridement at this time and start BID Dressing changes (Wet to Dry) -Cx Showing Abundant Staphylococcus Aures that is Resistant to Ciprofloxacin and Erythromycin   Vision changes - Patient now stating on 02/04/2021 that he has been seeing "black floaters" in his field of vision for several days.  When questioned why he did not bring this up sooner, he did not have a good answer and did not want to "trouble [place]" - ophtho evaluated 6/13, no endophthalmitis   DKA (diabetic ketoacidosis) (HCC)-resolved as of 02/02/2021 - present on admission - responded to fluids and insulin - now resolved -Lantus 15 units subcu daily as well as moderate NovoLog sliding scale insulin before meals and at bedtime and will add 3 units of NovoLog 3 times daily if the patient eats greater than 50% of his meals -He is given IV Decadron  once last night his blood sugars went up so we will need to continue monitor carefully as CBGs are now ranging from 106-187   Ascending aorta dilatation (HCC) - TTE on 6/10 notes mild dilation of ascending aorta, 38 mm - follow TEE results as well: normal thoracic and ascending aorta noted on 02/05/21  Hyperkalemia, improved  -Patient was given a dose of Lokelma earlier on this Admission  -Patient's potassium now 4.4 -Continue to monitor and trend and repeat CMP in the a.m.   Anxiety - Wellbutrin and Zoloft on hold for now in setting of hyponatremia   HTN (hypertension) - Pressure normal on admission.  Will treat if needed - Continue to Monitor BP per Protocol -Last BP was 110/73   Diabetes mellitus (HCC) - Continue SSI and CBG monitoring -C/w Lantus 15 units subcu daily as well as moderate NovoLog sliding scale insulin before meals and at bedtime and will add 3 units of NovoLog 3 times daily if the patient eats greater than 50% of his meals - A1c 10.2% on  01/31/2021 -CBGs ranging from 31-540   Obesity -Complicates overall prognosis and care -Estimated body mass index is 30.78 kg/m as calculated from the following:   Height as of this encounter: _0  (1.778 m).   Weight as of this encounter: 97.3 kg. -Weight Loss and Dietary Counseling given   Right Knee Pain, improved  -Obtained X-Ray and showed "Medial joint space narrowing is noted with mild osteophytic change. Mild patellofemoral changes are noted as well. No joint effusion is seen. No acute fracture is noted."  Normocytic Anemia -Relatively Stable -Patient's Hgb/Hct went from 10.5/29.9 -> 10.8/31.0 -> 10.1/28.7 -> 9.6/28.1 -> 10.2/30.4 -> 9.4/27.9 -> 7.9/24.4 -> 8.8/26.9 -Check Anemia Panel in the AM  -Continue to Monitor for S/Sx of Bleeding  -Repeat CBC in the AM   Thrombocytosis -Likely Reactive -Patient's Platelet Count went from 397 -> 414 -> 427 -> 444 -> 460 -> 536 -> 479 -> 544 -Continue to Monitor and Trend -Repeat CBC in the AM  DVT prophylaxis: Enoxaparin 40 mg subcu every 24 Code Status: FULL CODE  Family Communication: Discussed with family at bedside Disposition Plan: Pending further work-up and clearance by specialists; transferred to Medical Floor  Status is: Inpatient  Remains inpatient appropriate because:Unsafe d/c plan, IV treatments appropriate due to intensity of illness or inability to take PO, and Inpatient level of care appropriate due to severity of illness  Dispo: The patient is from: Home              Anticipated d/c is to: CIR vs SNF              Patient currently is not medically stable to d/c.   Difficult to place patient No  Consultants:  Infectious Diseases Orthopedic Surgery Interventional Radiology  CIR Cardiology  Procedures:  TEE Results: Normal LV size and function Normal RV size and function Normal RA Normal LA and LA appendage Abnormal TV with large filamentous highly mobile density predominantly on the ventricular side  of the TV leaflets consistent with vegetation.  There is mild associated TR. Normal PV Normal MV with trivial MR Normal trileaflet AV with trivial AR Normal interatrial septum with no evidence of shunt by colorflow dopper Normal thoracic and ascending aorta.    PROCEDURE 02/08/21 by Dr. Mardelle Matte: Left shoulder excisional debridement, deep abscess, with arthrotomy and irrigation and debridement of the joint with biceps tenotomy and excision of necrotic rotator cuff  PROCEDURE 02/10/21 by Dr.  Landau:     1.  Right shoulder excisional debridement, including biceps tenolysis 2.  Evacuation of purulent abscess, right subacromial space, as well as subcoracoid bursa 3.  Right shoulder arthrotomy with irrigation and debridement of joint 4.  Right shoulder open distal clavicle resection  PROCEDURE 02/10/21 by Dr. Redmond Pulling:  Incision and drainage of multiple left lateral chest wall abscesses along with excisional debridement of left lateral chest wall skin, soft tissue, muscle, and fascia and axilla.  Antimicrobials:  Anti-infectives (From admission, onward)    Start     Dose/Rate Route Frequency Ordered Stop   02/10/21 0727  ceFAZolin (ANCEF) 2-4 GM/100ML-% IVPB       Note to Pharmacy: Darlys Gales   : cabinet override      02/10/21 0727 02/10/21 1856   02/01/21 0600  ceFAZolin (ANCEF) IVPB 2g/100 mL premix        2 g 200 mL/hr over 30 Minutes Intravenous Every 8 hours 02/01/21 0414     01/31/21 1800  cefTRIAXone (ROCEPHIN) 1 g in sodium chloride 0.9 % 100 mL IVPB  Status:  Discontinued        1 g 200 mL/hr over 30 Minutes Intravenous Every 24 hours 01/31/21 1453 02/01/21 0425   01/31/21 1115  vancomycin (VANCOREADY) IVPB 2000 mg/400 mL        2,000 mg 200 mL/hr over 120 Minutes Intravenous  Once 01/31/21 1105 01/31/21 1320   01/31/21 1045  ceFEPIme (MAXIPIME) 2 g in sodium chloride 0.9 % 100 mL IVPB        2 g 200 mL/hr over 30 Minutes Intravenous  Once 01/31/21 1035 01/31/21 1129    01/31/21 1045  metroNIDAZOLE (FLAGYL) IVPB 500 mg        500 mg 100 mL/hr over 60 Minutes Intravenous  Once 01/31/21 1035 01/31/21 1208   01/31/21 1045  vancomycin (VANCOCIN) IVPB 1000 mg/200 mL premix  Status:  Discontinued        1,000 mg 200 mL/hr over 60 Minutes Intravenous  Once 01/31/21 1035 01/31/21 1105        Subjective: Seen and examined at bedside and he was feeling better today. Had some pain medications prior to his dressing changes. No nausea or vomiting.  Denies any lightheadedness or dizziness.  Feels overall little bit more improved today than yesterday.  No other concerns or complaints at this time.  Objective: Vitals:   02/11/21 0537 02/11/21 1251 02/11/21 2200 02/12/21 0541  BP: 112/72 121/81 118/73 110/73  Pulse: 89 97 99 84  Resp: _0 Temp: 98.7 F (37.1 C) 99.2 F (37.3 C) (!) 100.9 F (38.3 C) 98.3 F (36.8 C)  TempSrc: Oral Oral Oral Oral  SpO2: 94% 97% 100% 91%  Weight:      Height:        Intake/Output Summary (Last 24 hours) at 02/12/2021 1321 Last data filed at 02/12/2021 0815 Gross per 24 hour  Intake 1380 ml  Output 2350 ml  Net -970 ml    Filed Weights   01/31/21 1400 02/05/21 0500 02/08/21 1540  Weight: 89.9 kg 97.3 kg 97.3 kg   Examination: Physical Exam:  Constitutional: WN/WD obese Caucasian male currently in NAD and appears calm and more comfortable Eyes:  Lids and conjunctivae normal, sclerae anicteric  ENMT: External Ears, Nose appear normal. Grossly normal hearing.  Neck: Appears normal, supple, no cervical masses, normal ROM, no appreciable thyromegaly; no JVD Respiratory: Diminished to auscultation bilaterally, no wheezing, rales, rhonchi or crackles.  Normal respiratory effort and patient is not tachypenic. No accessory muscle use. Unlabored breathing  Cardiovascular: RRR, no murmurs / rubs / gallops. S1 and S2 auscultated. 1+ LE edema Abdomen: Soft, non-tender, Distended 2/2 body habitus. Bowel sounds positive.  GU:  Deferred. Musculoskeletal: No clubbing / cyanosis of digits/nails. No joint deformity upper and lower extremities. Dressings were just changed so not viewed Skin: No rashes, lesions, ulcers on a limited skin evaluation. No induration; Warm and dry.  Neurologic: CN 2-12 grossly intact with no focal deficits. Romberg sign and cerebellar reflexes not assessed.  Psychiatric: Normal judgment and insight. Alert and oriented x 3. Normal mood and appropriate affect.   Data Reviewed: I have personally reviewed following labs and imaging studies  CBC: Recent Labs  Lab 02/08/21 0234 02/09/21 0502 02/10/21 0546 02/11/21 0539 02/12/21 0535  WBC 18.8* 18.5* 18.3* 13.0* 15.5*  NEUTROABS 15.8* 17.2* 15.7* 10.6* 12.6*  HGB 9.6* 10.2* 9.4* 7.9* 8.8*  HCT 28.1* 30.4* 27.9* 24.4* 26.9*  MCV 89.5 91.8 92.1 96.8 94.7  PLT 444* 460* 536* 479* 544*    Basic Metabolic Panel: Recent Labs  Lab 02/08/21 0234 02/09/21 0502 02/10/21 0546 02/11/21 0539 02/12/21 0535  NA 128* 132* 130* 132* 132*  K 4.2 5.2* 4.9 4.6 4.4  CL 92* 96* 95* 97* 95*  CO2 _0 GLUCOSE 142* 405* 168* 137* 136*  BUN _1 CREATININE 0.44* 0.69 0.68 0.42* 0.52*  CALCIUM 7.9* 8.0* 7.8* 7.8* 8.2*  MG 2.0 1.9 1.8 1.7 1.8  PHOS 4.5 5.0* 3.6 4.3 4.1    GFR: Estimated Creatinine Clearance: 124.9 mL/min (A) (by C-G formula based on SCr of 0.52 mg/dL (L)). Liver Function Tests: Recent Labs  Lab 02/08/21 0234 02/09/21 0502 02/10/21 0546 02/11/21 0539 02/12/21 0535  AST 61* 35 35 25 29  ALT _2 ALKPHOS 98 95 73 55 61  BILITOT 0.7 0.7 0.8 0.7 0.9  PROT 5.3* 5.4* 5.0* 4.9* 5.5*  ALBUMIN 1.8* 1.7* 1.8* 1.9* 2.0*    No results for input(s): LIPASE, AMYLASE in the last 168 hours. No results for input(s): AMMONIA in the last 168 hours. Coagulation Profile: No results for input(s): INR, PROTIME in the last 168 hours.  Cardiac Enzymes: No results for input(s): CKTOTAL, CKMB, CKMBINDEX, TROPONINI  in the last 168 hours. BNP (last 3 results) No results for input(s): PROBNP in the last 8760 hours. HbA1C: No results for input(s): HGBA1C in the last 72 hours. CBG: Recent Labs  Lab 02/11/21 1200 02/11/21 1641 02/11/21 2242 02/12/21 0748 02/12/21 1144  GLUCAP 237* 96 106* 135* 187*    Lipid Profile: No results for input(s): CHOL, HDL, LDLCALC, TRIG, CHOLHDL, LDLDIRECT in the last 72 hours. Thyroid Function Tests: No results for input(s): TSH, T4TOTAL, FREET4, T3FREE, THYROIDAB in the last 72 hours.  Anemia Panel: No results for input(s): VITAMINB12, FOLATE, FERRITIN, TIBC, IRON, RETICCTPCT in the last 72 hours. Sepsis Labs: No results for input(s): PROCALCITON, LATICACIDVEN in the last 168 hours.   Recent Results (from the past 240 hour(s))  Aerobic/Anaerobic Culture w Gram Stain (surgical/deep wound)     Status: None   Collection Time: 02/02/21  4:12 PM   Specimen: Abscess  Result Value Ref Range Status   Specimen Description   Final    ABSCESS SUBSCAPULAR Performed at [facility] [address], [place], [place] [address]    Special Requests   Final    NONE  Performed at [facility], [place] [address], [place], [place] [address]    Gram Stain   Final    ABUNDANT WBC PRESENT, PREDOMINANTLY PMN ABUNDANT GRAM POSITIVE COCCI IN CLUSTERS IN TETRADS    Culture   Final    ABUNDANT STAPHYLOCOCCUS AUREUS NO ANAEROBES ISOLATED Performed at [facility], [place] [address] [place], [place] [address]    Report Status 02/07/2021 FINAL  Final   Organism ID, Bacteria STAPHYLOCOCCUS AUREUS  Final      Susceptibility   Staphylococcus aureus - MIC*    CIPROFLOXACIN >=8 RESISTANT Resistant     ERYTHROMYCIN >=8 RESISTANT Resistant     GENTAMICIN <=0.5 SENSITIVE Sensitive     OXACILLIN 0.5 SENSITIVE Sensitive     TETRACYCLINE <=1 SENSITIVE Sensitive     VANCOMYCIN <=0.5 SENSITIVE Sensitive     TRIMETH/SULFA <=10 SENSITIVE Sensitive      CLINDAMYCIN <=0.25 SENSITIVE Sensitive     RIFAMPIN <=0.5 SENSITIVE Sensitive     Inducible Clindamycin NEGATIVE Sensitive     * ABUNDANT STAPHYLOCOCCUS AUREUS  Aerobic/Anaerobic Culture w Gram Stain (surgical/deep wound)     Status: None (Preliminary result)   Collection Time: 02/08/21  6:48 PM   Specimen: Synovial, Left Shoulder; Body Fluid  Result Value Ref Range Status   Specimen Description   Final    SHOULDER LEFT Performed at [facility] [address] [place], [place] [address]    Special Requests   Final    NONE Performed at [facility], [place] [address] [place], [place] [address]    Gram Stain   Final    ABUNDANT WBC PRESENT,BOTH PMN AND MONONUCLEAR RARE GRAM POSITIVE COCCI IN PAIRS Performed at [facility], [place] [address] [place], [place] [address]    Culture   Final    RARE STAPHYLOCOCCUS AUREUS NO ANAEROBES ISOLATED; CULTURE IN PROGRESS FOR 5 DAYS    Report Status PENDING  Incomplete   Organism ID, Bacteria STAPHYLOCOCCUS AUREUS  Final      Susceptibility   Staphylococcus aureus - MIC*    CIPROFLOXACIN >=8 RESISTANT Resistant     ERYTHROMYCIN >=8 RESISTANT Resistant     GENTAMICIN <=0.5 SENSITIVE Sensitive     OXACILLIN 0.5 SENSITIVE Sensitive     TETRACYCLINE <=1 SENSITIVE Sensitive     VANCOMYCIN 1 SENSITIVE Sensitive     TRIMETH/SULFA <=10 SENSITIVE Sensitive     CLINDAMYCIN <=0.25 SENSITIVE Sensitive     RIFAMPIN <=0.5 SENSITIVE Sensitive     Inducible Clindamycin NEGATIVE Sensitive     * RARE STAPHYLOCOCCUS AUREUS  Aerobic/Anaerobic Culture w Gram Stain (surgical/deep wound)     Status: None (Preliminary result)   Collection Time: 02/08/21  6:52 PM   Specimen: Joint, Other; Body Fluid  Result Value Ref Range Status   Specimen Description   Final    SHOULDER LEFT BONE CHIPS Performed at [facility] [address], [place], [place] [address]    Special Requests   Final     NONE Performed at [facility], [place] [address], [place], [place] [address]    Gram Stain   Final    NO WBC SEEN NO ORGANISMS SEEN Performed at [facility], [place] [address], [place], [place] [address]    Culture   Final    RARE STAPHYLOCOCCUS AUREUS NO ANAEROBES ISOLATED; CULTURE IN PROGRESS FOR 5 DAYS    Report Status PENDING  Incomplete   Organism ID,  Bacteria STAPHYLOCOCCUS AUREUS  Final      Susceptibility   Staphylococcus aureus - MIC*    CIPROFLOXACIN >=8 RESISTANT Resistant     ERYTHROMYCIN >=8 RESISTANT Resistant     GENTAMICIN <=0.5 SENSITIVE Sensitive     OXACILLIN 0.5 SENSITIVE Sensitive     TETRACYCLINE <=1 SENSITIVE Sensitive     VANCOMYCIN 1 SENSITIVE Sensitive     TRIMETH/SULFA <=10 SENSITIVE Sensitive     CLINDAMYCIN <=0.25 SENSITIVE Sensitive     RIFAMPIN <=0.5 SENSITIVE Sensitive     Inducible Clindamycin NEGATIVE Sensitive     * RARE STAPHYLOCOCCUS AUREUS  Aerobic/Anaerobic Culture w Gram Stain (surgical/deep wound)     Status: None (Preliminary result)   Collection Time: 02/10/21 10:22 AM   Specimen: Wound  Result Value Ref Range Status   Specimen Description   Final    WOUND CHEST Performed at [place] [address], [place], [place] [address]    Special Requests   Final    NONE Performed at [facility], [place] [address] [place], [place] [address]    Gram Stain   Final    ABUNDANT WBC PRESENT,BOTH PMN AND MONONUCLEAR MODERATE GRAM POSITIVE COCCI Performed at [facility], [place] [address] [place], [place] [address]    Culture ABUNDANT STAPHYLOCOCCUS AUREUS  Final   Report Status PENDING  Incomplete   Organism ID, Bacteria STAPHYLOCOCCUS AUREUS  Final      Susceptibility   Staphylococcus aureus - MIC*    CIPROFLOXACIN >=8 RESISTANT Resistant     ERYTHROMYCIN >=8 RESISTANT Resistant     GENTAMICIN <=0.5 SENSITIVE Sensitive     OXACILLIN 0.5 SENSITIVE Sensitive      TETRACYCLINE <=1 SENSITIVE Sensitive     VANCOMYCIN 1 SENSITIVE Sensitive     TRIMETH/SULFA <=10 SENSITIVE Sensitive     CLINDAMYCIN <=0.25 SENSITIVE Sensitive     RIFAMPIN <=0.5 SENSITIVE Sensitive     Inducible Clindamycin NEGATIVE Sensitive     * ABUNDANT STAPHYLOCOCCUS AUREUS      RN Pressure Injury Documentation: Pressure Injury 01/31/21 Buttocks Right;Left;Posterior Stage 2 -  Partial thickness loss of dermis presenting as a shallow open injury with a red, pink wound bed without slough. (Active)  01/31/21 1546  Location: Buttocks  Location Orientation: Right;Left;Posterior  Staging: Stage 2 -  Partial thickness loss of dermis presenting as a shallow open injury with a red, pink wound bed without slough.  Wound Description (Comments):   Present on Admission: Yes     Pressure Injury (Active)     Location:   Location Orientation:   Staging:   Wound Description (Comments):   Present on Admission:      Estimated body mass index is 30.78 kg/m as calculated from the following:   Height as of this encounter: _0  (1.778 m).   Weight as of this encounter: 97.3 kg.  Malnutrition Type:   Malnutrition Characteristics:   Nutrition Interventions:    Radiology Studies: No results found.  Scheduled Meds:  Chlorhexidine Gluconate Cloth  6 each Topical Daily   enoxaparin (LOVENOX) injection  40 mg Subcutaneous Q24H   gabapentin  800 mg Oral TID   insulin aspart  0-15 Units Subcutaneous TID WC   insulin aspart  0-5 Units Subcutaneous QHS   insulin aspart  3 Units Subcutaneous TID WC   insulin glargine  15 Units Subcutaneous Daily   mouth rinse  15 mL Mouth Rinse BID   oxyCODONE  20 mg Oral Q12H  polyethylene glycol  17 g Oral Daily   povidone-iodine  2 application Topical Once   senna-docusate  1 tablet Oral BID   Continuous Infusions:  sodium chloride Stopped (02/07/21 0508)    ceFAZolin (ANCEF) IV 2 g (02/12/21 0808)    LOS: 12 days   Kerney Elbe,  DO [facility] PAGER is on AMION  If 7PM-7AM, please contact night-coverage [email or web address]

## 2021-02-12 NOTE — Progress Notes (Signed)
Inpatient Rehabilitation Admissions Coordinator   Insurance denied CIR on 6/14. There is no appeal in process at this time. I will follow his progress at a distance to assess his progress with therapies to assist in determining if a new Auth case for CIR vs SNF is appropriate. I spoke with his wife by phone and let her know of my current plans. We will have to assess his tolerance for intensive therapies over the next few days. Please call me with any questions.  Ottie Glazier, RN, MSN Rehab Admissions Coordinator [phone] 02/12/2021 11:37 AM

## 2021-02-12 NOTE — Progress Notes (Addendum)
[facility] Infectious Diseases Follow Up Note  Patient Identification: Patient Name: John Ware MRN: [id number] Admit Date: 01/31/2021  7:44 AM Age: 55 y.o.Today's Date: 02/12/2021   Reason for Visit: Disseminated MSSA infection  Active Problems:   Diabetes mellitus (HCC)   Hyponatremia   Pressure injury of skin   Bacteremia due to Staphylococcus aureus   HTN (hypertension)   Anxiety   Left shoulder pain   Ascending aorta dilatation (HCC)   Vision changes   Endocarditis   Antibiotics: Cefazolin 6/8-current                    total days of antibiotics 13  Lines/Tubes: PIV's  Interval Events:   Assessment Disseminated MSSA infection (bacteremia, TV endocarditis with pulmonary septic emboli,  left shoulder septic arthritis with subcapsular/subscapular abscess + right shoulder GH, AC JOINT septic arthritis, peri-scapular abscess with osteomyelitis)  Status post OR on 6/16 and 6/18.  Or cultures 6/16 and 6/18 with MSSA.  No plans for further surgical intervention from surgery  Recommendations Continue cefazolin as is Will need at least 6-8  weeks of IV antibiotics from date of last source control over visit on 6/18 OK to place a [facility] for long term IV antibiotics  Monitor CBC and BMP on IV antibiotics  Rest of the management as per the primary team. Thank you for the consult. Please page with pertinent questions or concerns.  ______________________________________________________________________ Subjective patient seen and examined at the bedside.  He is comfortably resting in bed.  Denies any complaints except some soreness of the right shoulder.  Denies any nausea vomiting or diarrhea.   BP 110/73 (BP Location: Left Arm)   Pulse 84   Temp 98.3 F (36.8 C) (Oral)   Resp 16   Ht  (1.778 m)   Wt 97.3 kg   SpO2 91%   BMI 30.78 kg/m     Physical Exam Constitutional: Not in acute distress, comfortable     Comments:   Cardiovascular:     Rate and Rhythm: Normal rate and regular rhythm.     Heart sounds:   Pulmonary:     Effort: Pulmonary effort is normal.     Comments: Clear lung fields bilaterally  Abdominal:     Palpations: Abdomen is soft.     Tenderness: Nontender and nondistended  Musculoskeletal:        General: Bilateral shoulders wrapped in a bandage  Skin:    Comments: No lesions or rashes  Neurological:     General: No focal deficit present.   Psychiatric:        Mood and Affect: Mood normal.   Pertinent Microbiology Results for orders placed or performed during the hospital encounter of 01/31/21  Urine culture     Status: Abnormal   Collection Time: 01/31/21  8:51 AM   Specimen: In/Out Cath Urine  Result Value Ref Range Status   Specimen Description   Final    IN/OUT CATH URINE Performed at [facility], [address], [place], [place] [address]    Special Requests   Final    NONE Performed at [facility], [address] [place], [place] [address]    Culture >=100,000 COLONIES/mL STAPHYLOCOCCUS AUREUS (A)  Final   Report Status 02/02/2021 FINAL  Final   Organism ID, Bacteria STAPHYLOCOCCUS AUREUS (A)  Final      Susceptibility   Staphylococcus aureus - MIC*    CIPROFLOXACIN >=8 RESISTANT Resistant  GENTAMICIN <=0.5 SENSITIVE Sensitive     NITROFURANTOIN <=16 SENSITIVE Sensitive     OXACILLIN 0.5 SENSITIVE Sensitive     TETRACYCLINE <=1 SENSITIVE Sensitive     VANCOMYCIN 1 SENSITIVE Sensitive     TRIMETH/SULFA <=10 SENSITIVE Sensitive     CLINDAMYCIN <=0.25 SENSITIVE Sensitive     RIFAMPIN <=0.5 SENSITIVE Sensitive     Inducible Clindamycin NEGATIVE Sensitive     * >=100,000 COLONIES/mL STAPHYLOCOCCUS AUREUS  Resp Panel by RT-PCR (Flu A&B, Covid) Nasopharyngeal Swab     Status: Abnormal   Collection Time: 01/31/21 10:59 AM   Specimen: Nasopharyngeal Swab; Nasopharyngeal(NP) swabs in vial transport medium   Result Value Ref Range Status   SARS Coronavirus 2 by RT PCR POSITIVE (A) NEGATIVE Final    Comment: RESULT CALLED TO, READ BACK BY AND VERIFIED WITH: GARRISON,G. RN AT 1206 01/31/21 MULLINS,T (NOTE) SARS-CoV-2 target nucleic acids are DETECTED.  The SARS-CoV-2 RNA is generally detectable in upper respiratory specimens during the acute phase of infection. Positive results are indicative of the presence of the identified virus, but do not rule out bacterial infection or co-infection with other pathogens not detected by the test. Clinical correlation with patient history and other diagnostic information is necessary to determine patient infection status. The expected result is Negative.  Fact Sheet for Patients: BloggerCourse.com  Fact Sheet for Healthcare Providers: SeriousBroker.it  This test is not yet approved or cleared by the [place] FDA and  has been authorized for detection and/or diagnosis of SARS-CoV-2 by FDA under an Emergency Use Authorization (EUA).  This EUA will remain in effect (meaning this tes t can be used) for the duration of  the COVID-19 declaration under Section 564(b)(1) of the Act, 21 U.S.C. section 360bbb-3(b)(1), unless the authorization is terminated or revoked sooner.     Influenza A by PCR NEGATIVE NEGATIVE Final   Influenza B by PCR NEGATIVE NEGATIVE Final    Comment: (NOTE) The Xpert Xpress SARS-CoV-2/FLU/RSV plus assay is intended as an aid in the diagnosis of influenza from Nasopharyngeal swab specimens and should not be used as a sole basis for treatment. Nasal washings and aspirates are unacceptable for Xpert Xpress SARS-CoV-2/FLU/RSV testing.  Fact Sheet for Patients: BloggerCourse.com  Fact Sheet for Healthcare Providers: SeriousBroker.it  This test is not yet approved or cleared by the [place] FDA and has been authorized for  detection and/or diagnosis of SARS-CoV-2 by FDA under an Emergency Use Authorization (EUA). This EUA will remain in effect (meaning this test can be used) for the duration of the COVID-19 declaration under Section 564(b)(1) of the Act, 21 U.S.C. section 360bbb-3(b)(1), unless the authorization is terminated or revoked.  Performed at [facility], [address] [place], [place] [address]   Blood Culture (routine x 2)     Status: Abnormal   Collection Time: 01/31/21 10:59 AM   Specimen: BLOOD  Result Value Ref Range Status   Specimen Description   Final    BLOOD LEFT ANTECUBITAL Performed at [facility], [address] [place], [place] [address]    Special Requests   Final    BOTTLES DRAWN AEROBIC AND ANAEROBIC Blood Culture adequate volume Performed at [facility], [address], [place], [place] [address]    Culture  Setup Time   Final    GRAM POSITIVE COCCI IN CLUSTERS IN BOTH AEROBIC AND ANAEROBIC BOTTLES CRITICAL RESULT CALLED TO, READ BACK BY AND VERIFIED WITH: PHARMD MICHELLE L. [id number] FCP  Performed at [facility], [address], [place], [place] [address]    Culture STAPHYLOCOCCUS AUREUS (A)  Final   Report Status 02/04/2021 FINAL  Final   Organism ID, Bacteria STAPHYLOCOCCUS AUREUS  Final      Susceptibility   Staphylococcus aureus - MIC*    CIPROFLOXACIN >=8 RESISTANT Resistant     ERYTHROMYCIN >=8 RESISTANT Resistant     GENTAMICIN <=0.5 SENSITIVE Sensitive     OXACILLIN 0.5 SENSITIVE Sensitive     TETRACYCLINE <=1 SENSITIVE Sensitive     VANCOMYCIN <=0.5 SENSITIVE Sensitive     TRIMETH/SULFA <=10 SENSITIVE Sensitive     CLINDAMYCIN <=0.25 SENSITIVE Sensitive     RIFAMPIN <=0.5 SENSITIVE Sensitive     Inducible Clindamycin NEGATIVE Sensitive     * STAPHYLOCOCCUS AUREUS  Blood Culture ID Panel (Reflexed)     Status: Abnormal   Collection Time: 01/31/21 10:59 AM  Result Value Ref Range  Status   Enterococcus faecalis NOT DETECTED NOT DETECTED Final   Enterococcus Faecium NOT DETECTED NOT DETECTED Final   Listeria monocytogenes NOT DETECTED NOT DETECTED Final   Staphylococcus species DETECTED (A) NOT DETECTED Final    Comment: CRITICAL RESULT CALLED TO, READ BACK BY AND VERIFIED WITH: PHARMD MICHELLE L. [id number] FCP    Staphylococcus aureus (BCID) DETECTED (A) NOT DETECTED Final    Comment: CRITICAL RESULT CALLED TO, READ BACK BY AND VERIFIED WITH: PHARMD MICHELLE L. [id number] FCP    Staphylococcus epidermidis NOT DETECTED NOT DETECTED Final   Staphylococcus lugdunensis NOT DETECTED NOT DETECTED Final   Streptococcus species NOT DETECTED NOT DETECTED Final   Streptococcus agalactiae NOT DETECTED NOT DETECTED Final   Streptococcus pneumoniae NOT DETECTED NOT DETECTED Final   Streptococcus pyogenes NOT DETECTED NOT DETECTED Final   A.calcoaceticus-baumannii NOT DETECTED NOT DETECTED Final   Bacteroides fragilis NOT DETECTED NOT DETECTED Final   Enterobacterales NOT DETECTED NOT DETECTED Final   Enterobacter cloacae complex NOT DETECTED NOT DETECTED Final   Escherichia coli NOT DETECTED NOT DETECTED Final   Klebsiella aerogenes NOT DETECTED NOT DETECTED Final   Klebsiella oxytoca NOT DETECTED NOT DETECTED Final   Klebsiella pneumoniae NOT DETECTED NOT DETECTED Final   Proteus species NOT DETECTED NOT DETECTED Final   Salmonella species NOT DETECTED NOT DETECTED Final   Serratia marcescens NOT DETECTED NOT DETECTED Final   Haemophilus influenzae NOT DETECTED NOT DETECTED Final   Neisseria meningitidis NOT DETECTED NOT DETECTED Final   Pseudomonas aeruginosa NOT DETECTED NOT DETECTED Final   Stenotrophomonas maltophilia NOT DETECTED NOT DETECTED Final   Candida albicans NOT DETECTED NOT DETECTED Final   Candida auris NOT DETECTED NOT DETECTED Final   Candida glabrata NOT DETECTED NOT DETECTED Final   Candida krusei NOT DETECTED NOT DETECTED Final   Candida  parapsilosis NOT DETECTED NOT DETECTED Final   Candida tropicalis NOT DETECTED NOT DETECTED Final   Cryptococcus neoformans/gattii NOT DETECTED NOT DETECTED Final   Meth resistant mecA/C and MREJ NOT DETECTED NOT DETECTED Final    Comment: Performed at [facility], [address] [place], [place] [address]  Blood Culture (routine x 2)     Status: Abnormal   Collection Time: 01/31/21 11:15 AM   Specimen: BLOOD  Result Value Ref Range Status   Specimen Description   Final    BLOOD RIGHT ANTECUBITAL Performed at [facility], [address], [place], [place] [address]    Special Requests   Final    BOTTLES DRAWN  AEROBIC AND ANAEROBIC Blood Culture adequate volume Performed at [facility], [address] [place], [place] [address]    Culture  Setup Time   Final    GRAM POSITIVE COCCI IN CLUSTERS IN BOTH AEROBIC AND ANAEROBIC BOTTLES CRITICAL VALUE NOTED.  VALUE IS CONSISTENT WITH PREVIOUSLY REPORTED AND CALLED VALUE.    Culture (A)  Final    STAPHYLOCOCCUS AUREUS SUSCEPTIBILITIES PERFORMED ON PREVIOUS CULTURE WITHIN THE LAST 5 DAYS. Performed at [facility], [address] [place], [place] [address]    Report Status 02/03/2021 FINAL  Final  MRSA PCR Screening     Status: None   Collection Time: 01/31/21  2:28 PM   Specimen: Nasal Mucosa; Nasopharyngeal  Result Value Ref Range Status   MRSA by PCR NEGATIVE NEGATIVE Final    Comment:        The GeneXpert MRSA Assay (FDA approved for NASAL specimens only), is one component of a comprehensive MRSA colonization surveillance program. It is not intended to diagnose MRSA infection nor to guide or monitor treatment for MRSA infections. Performed at [facility], [address] [place], [place] [address]   Culture, blood (routine x 2)     Status: None   Collection Time: 02/01/21  2:20 PM   Specimen: BLOOD  Result Value Ref Range Status   Specimen  Description   Final    BLOOD RIGHT ANTECUBITAL Performed at [facility], [address] [place], [place] [address]    Special Requests   Final    BOTTLES DRAWN AEROBIC AND ANAEROBIC Blood Culture adequate volume Performed at [facility], [address] [place], [place] [address]    Culture   Final    NO GROWTH 5 DAYS Performed at [facility], [address] [place], [place] [address]    Report Status 02/06/2021 FINAL  Final  Culture, blood (routine x 2)     Status: None   Collection Time: 02/01/21  2:20 PM   Specimen: BLOOD RIGHT HAND  Result Value Ref Range Status   Specimen Description   Final    BLOOD RIGHT HAND Performed at [facility], [address] [place], [place] [address]    Special Requests   Final    BOTTLES DRAWN AEROBIC AND ANAEROBIC Blood Culture adequate volume Performed at [facility], [address] [place], [place] [address]    Culture   Final    NO GROWTH 5 DAYS Performed at [facility], [address] [place], [place] [address]    Report Status 02/06/2021 FINAL  Final  Aerobic/Anaerobic Culture w Gram Stain (surgical/deep wound)     Status: None   Collection Time: 02/02/21  4:12 PM   Specimen: Abscess  Result Value Ref Range Status   Specimen Description   Final    ABSCESS SUBSCAPULAR Performed at [facility], [address] [place], [place] [address]    Special Requests   Final    NONE Performed at [facility], [address] [place], [place] [address]    Gram Stain   Final    ABUNDANT WBC PRESENT, PREDOMINANTLY PMN ABUNDANT GRAM POSITIVE COCCI IN CLUSTERS IN TETRADS    Culture   Final    ABUNDANT STAPHYLOCOCCUS AUREUS NO ANAEROBES ISOLATED Performed at [facility], [address] [place], [place] [address]    Report Status 02/07/2021 FINAL  Final  Organism ID, Bacteria STAPHYLOCOCCUS AUREUS   Final      Susceptibility   Staphylococcus aureus - MIC*    CIPROFLOXACIN >=8 RESISTANT Resistant     ERYTHROMYCIN >=8 RESISTANT Resistant     GENTAMICIN <=0.5 SENSITIVE Sensitive     OXACILLIN 0.5 SENSITIVE Sensitive     TETRACYCLINE <=1 SENSITIVE Sensitive     VANCOMYCIN <=0.5 SENSITIVE Sensitive     TRIMETH/SULFA <=10 SENSITIVE Sensitive     CLINDAMYCIN <=0.25 SENSITIVE Sensitive     RIFAMPIN <=0.5 SENSITIVE Sensitive     Inducible Clindamycin NEGATIVE Sensitive     * ABUNDANT STAPHYLOCOCCUS AUREUS  Aerobic/Anaerobic Culture w Gram Stain (surgical/deep wound)     Status: None (Preliminary result)   Collection Time: 02/08/21  6:48 PM   Specimen: Synovial, Left Shoulder; Body Fluid  Result Value Ref Range Status   Specimen Description   Final    SHOULDER LEFT Performed at [facility], [address] [place], [place] [address]    Special Requests   Final    NONE Performed at [facility], [address] [place], [place] [address]    Gram Stain   Final    ABUNDANT WBC PRESENT,BOTH PMN AND MONONUCLEAR RARE GRAM POSITIVE COCCI IN PAIRS Performed at [facility], [address] [place], [place] [address]    Culture   Final    RARE STAPHYLOCOCCUS AUREUS NO ANAEROBES ISOLATED; CULTURE IN PROGRESS FOR 5 DAYS    Report Status PENDING  Incomplete   Organism ID, Bacteria STAPHYLOCOCCUS AUREUS  Final      Susceptibility   Staphylococcus aureus - MIC*    CIPROFLOXACIN >=8 RESISTANT Resistant     ERYTHROMYCIN >=8 RESISTANT Resistant     GENTAMICIN <=0.5 SENSITIVE Sensitive     OXACILLIN 0.5 SENSITIVE Sensitive     TETRACYCLINE <=1 SENSITIVE Sensitive     VANCOMYCIN 1 SENSITIVE Sensitive     TRIMETH/SULFA <=10 SENSITIVE Sensitive     CLINDAMYCIN <=0.25 SENSITIVE Sensitive     RIFAMPIN <=0.5 SENSITIVE Sensitive     Inducible Clindamycin NEGATIVE Sensitive     * RARE STAPHYLOCOCCUS AUREUS  Aerobic/Anaerobic Culture w Gram Stain  (surgical/deep wound)     Status: None (Preliminary result)   Collection Time: 02/08/21  6:52 PM   Specimen: Joint, Other; Body Fluid  Result Value Ref Range Status   Specimen Description   Final    SHOULDER LEFT BONE CHIPS Performed at [facility], [address] [place], [place] [address]    Special Requests   Final    NONE Performed at [facility], [address] [place], [place] [address]    Gram Stain   Final    NO WBC SEEN NO ORGANISMS SEEN Performed at [facility], [address], [place], [place] [address]    Culture   Final    RARE STAPHYLOCOCCUS AUREUS NO ANAEROBES ISOLATED; CULTURE IN PROGRESS FOR 5 DAYS    Report Status PENDING  Incomplete   Organism ID, Bacteria STAPHYLOCOCCUS AUREUS  Final      Susceptibility   Staphylococcus aureus - MIC*    CIPROFLOXACIN >=8 RESISTANT Resistant     ERYTHROMYCIN >=8 RESISTANT Resistant     GENTAMICIN <=0.5 SENSITIVE Sensitive     OXACILLIN 0.5 SENSITIVE Sensitive     TETRACYCLINE <=1 SENSITIVE Sensitive     VANCOMYCIN 1 SENSITIVE Sensitive     TRIMETH/SULFA <=10 SENSITIVE Sensitive     CLINDAMYCIN <=  0.25 SENSITIVE Sensitive     RIFAMPIN <=0.5 SENSITIVE Sensitive     Inducible Clindamycin NEGATIVE Sensitive     * RARE STAPHYLOCOCCUS AUREUS  Aerobic/Anaerobic Culture w Gram Stain (surgical/deep wound)     Status: None (Preliminary result)   Collection Time: 02/10/21 10:22 AM   Specimen: Wound  Result Value Ref Range Status   Specimen Description   Final    WOUND CHEST Performed at [facility], [address], [place], [place] [address]    Special Requests   Final    NONE Performed at [facility], [address] [place], [place] [address]    Gram Stain   Final    ABUNDANT WBC PRESENT,BOTH PMN AND MONONUCLEAR MODERATE GRAM POSITIVE COCCI Performed at [facility], [address], [place], [place] [address]    Culture ABUNDANT  STAPHYLOCOCCUS AUREUS  Final   Report Status PENDING  Incomplete   Organism ID, Bacteria STAPHYLOCOCCUS AUREUS  Final      Susceptibility   Staphylococcus aureus - MIC*    CIPROFLOXACIN >=8 RESISTANT Resistant     ERYTHROMYCIN >=8 RESISTANT Resistant     GENTAMICIN <=0.5 SENSITIVE Sensitive     OXACILLIN 0.5 SENSITIVE Sensitive     TETRACYCLINE <=1 SENSITIVE Sensitive     VANCOMYCIN 1 SENSITIVE Sensitive     TRIMETH/SULFA <=10 SENSITIVE Sensitive     CLINDAMYCIN <=0.25 SENSITIVE Sensitive     RIFAMPIN <=0.5 SENSITIVE Sensitive     Inducible Clindamycin NEGATIVE Sensitive     * ABUNDANT STAPHYLOCOCCUS AUREUS    Pertinent Lab. CBC Latest Ref Rng & Units 02/12/2021 02/11/2021 02/10/2021  WBC 4.0 - 10.5 K/uL 15.5(H) 13.0(H) 18.3(H)  Hemoglobin 13.0 - 17.0 g/dL 6.3(Z) 7.9(L) 9.4(L)  Hematocrit 39.0 - 52.0 % 26.9(L) 24.4(L) 27.9(L)  Platelets 150 - 400 K/uL 544(H) 479(H) 536(H)   CMP Latest Ref Rng & Units 02/12/2021 02/11/2021 02/10/2021  Glucose 70 - 99 mg/dL 858(I) 502(D) 741(O)  BUN 6 - 20 mg/dL 7 8 10   Creatinine 0.61 - 1.24 mg/dL ) 8.78(M) 7.67(M  Sodium 135 - 145 mmol/L 132(L) 132(L) 130(L)  Potassium 3.5 - 5.1 mmol/L 4.4 4.6 4.9  Chloride 98 - 111 mmol/L 95(L) 97(L) 95(L)  CO2 22 - 32 mmol/L 30 28 27   Calcium 8.9 - 10.3 mg/dL 8.2(L) 7.8(L) 7.8(L)  Total Protein 6.5 - 8.1 g/dL 0.94) 4.9(L) 5.0(L)  Total Bilirubin 0.3 - 1.2 mg/dL 0.9 0.7 0.8  Alkaline Phos 38 - 126 U/L 61 55 73  AST 15 - 41 U/L 29 25 35  ALT 0 - 44 U/L 17 15 19     Pertinent Imaging today Plain films and CT images have been personally visualized and interpreted; radiology reports have been reviewed. Decision making incorporated into the Impression / Recommendations.  I have spent more than 35 minutes for this patient encounter including review of prior medical records, coordination of care  with greater than 50% of time being face to face/counseling and discussing diagnostics/treatment plan with the  patient/family.  Electronically signed by:   , MD Infectious Disease Physician [facility] for Infectious Disease Pager: [phone]

## 2021-02-13 ENCOUNTER — Inpatient Hospital Stay (HOSPITAL_COMMUNITY): Payer: Commercial Managed Care - PPO

## 2021-02-13 ENCOUNTER — Encounter: Payer: Self-pay | Admitting: Internal Medicine

## 2021-02-13 ENCOUNTER — Encounter (HOSPITAL_COMMUNITY): Payer: Self-pay | Admitting: Internal Medicine

## 2021-02-13 DIAGNOSIS — F419 Anxiety disorder, unspecified: Secondary | ICD-10-CM | POA: Diagnosis not present

## 2021-02-13 DIAGNOSIS — I77810 Thoracic aortic ectasia: Secondary | ICD-10-CM | POA: Diagnosis not present

## 2021-02-13 DIAGNOSIS — R7881 Bacteremia: Secondary | ICD-10-CM | POA: Diagnosis not present

## 2021-02-13 DIAGNOSIS — A419 Sepsis, unspecified organism: Secondary | ICD-10-CM | POA: Diagnosis not present

## 2021-02-13 LAB — CBC WITH DIFFERENTIAL/PLATELET
Abs Immature Granulocytes: 0.08 10*3/uL — ABNORMAL HIGH (ref 0.00–0.07)
Basophils Absolute: 0.1 10*3/uL (ref 0.0–0.1)
Basophils Relative: 1 %
Eosinophils Absolute: 0 10*3/uL (ref 0.0–0.5)
Eosinophils Relative: 0 %
HCT: 23.2 % — ABNORMAL LOW (ref 39.0–52.0)
Hemoglobin: 7.4 g/dL — ABNORMAL LOW (ref 13.0–17.0)
Immature Granulocytes: 1 %
Lymphocytes Relative: 15 %
Lymphs Abs: 1.8 10*3/uL (ref 0.7–4.0)
MCH: 31 pg (ref 26.0–34.0)
MCHC: 31.9 g/dL (ref 30.0–36.0)
MCV: 97.1 fL (ref 80.0–100.0)
Monocytes Absolute: 0.7 10*3/uL (ref 0.1–1.0)
Monocytes Relative: 6 %
Neutro Abs: 9.4 10*3/uL — ABNORMAL HIGH (ref 1.7–7.7)
Neutrophils Relative %: 77 %
Platelets: 486 10*3/uL — ABNORMAL HIGH (ref 150–400)
RBC: 2.39 MIL/uL — ABNORMAL LOW (ref 4.22–5.81)
RDW: 15.6 % — ABNORMAL HIGH (ref 11.5–15.5)
WBC: 12.1 10*3/uL — ABNORMAL HIGH (ref 4.0–10.5)
nRBC: 0 % (ref 0.0–0.2)

## 2021-02-13 LAB — COMPREHENSIVE METABOLIC PANEL
ALT: 26 U/L (ref 0–44)
AST: 49 U/L — ABNORMAL HIGH (ref 15–41)
Albumin: 1.8 g/dL — ABNORMAL LOW (ref 3.5–5.0)
Alkaline Phosphatase: 60 U/L (ref 38–126)
Anion gap: 7 (ref 5–15)
BUN: 7 mg/dL (ref 6–20)
CO2: 28 mmol/L (ref 22–32)
Calcium: 8.3 mg/dL — ABNORMAL LOW (ref 8.9–10.3)
Chloride: 94 mmol/L — ABNORMAL LOW (ref 98–111)
Creatinine, Ser: 0.38 mg/dL — ABNORMAL LOW (ref 0.61–1.24)
GFR, Estimated: >60 mL/min (ref >60)
Glucose, Bld: 181 mg/dL — ABNORMAL HIGH (ref 70–99)
Potassium: 4.6 mmol/L (ref 3.5–5.1)
Sodium: 129 mmol/L — ABNORMAL LOW (ref 135–145)
Total Bilirubin: 0.7 mg/dL (ref 0.3–1.2)
Total Protein: 5.2 g/dL — ABNORMAL LOW (ref 6.5–8.1)

## 2021-02-13 LAB — PHOSPHORUS: Phosphorus: 4.3 mg/dL (ref 2.5–4.6)

## 2021-02-13 LAB — GLUCOSE, CAPILLARY
Glucose-Capillary: 154 mg/dL — ABNORMAL HIGH (ref 70–99)
Glucose-Capillary: 216 mg/dL — ABNORMAL HIGH (ref 70–99)
Glucose-Capillary: 87 mg/dL (ref 70–99)
Glucose-Capillary: 106 mg/dL — ABNORMAL HIGH (ref 70–99)

## 2021-02-13 LAB — MAGNESIUM: Magnesium: 1.7 mg/dL (ref 1.7–2.4)

## 2021-02-13 LAB — LACTIC ACID, PLASMA: Lactic Acid, Venous: 1.7 mmol/L (ref 0.5–1.9)

## 2021-02-13 MED ORDER — INSULIN ASPART 100 UNIT/ML IJ SOLN
4.0000 [IU] | Freq: Three times a day (TID) | INTRAMUSCULAR | Status: DC
  Administered 2021-02-13 – 2021-02-23 (×23): 4 [IU] via SUBCUTANEOUS

## 2021-02-13 MED ORDER — LACTATED RINGERS IV BOLUS
1000.0000 mL | Freq: Once | INTRAVENOUS | Status: AC
  Administered 2021-02-13: 1000 mL via INTRAVENOUS

## 2021-02-13 NOTE — Progress Notes (Signed)
3 Days Post-Op  Subjective: CC: Febrile overnight. Seen for dressing change with WOCN.   Objective: Vital signs in last 24 hours: Temp:  [98.4 F (36.9 C)-101.7 F (38.7 C)] 99.7 F (37.6 C) (06/21 0441) Pulse Rate:  [89-114] 89 (06/21 0441) Resp:  [18-22] 20 (06/21 0441) BP: (115-126)/(67-78) 117/78 (06/21 0441) SpO2:  [75 %-100 %] 100 % (06/21 0441) Last BM Date: 02/12/21  Intake/Output from previous day: 06/20 0701 - 06/21 0700 In: 362.2 [P.O.:240; IV Piggyback:122.2] Out: 200 [Urine:200] Intake/Output this shift: No intake/output data recorded.  PE: Gen:  Alert, NAD, pleasant Pulm:  rate and effort normal Skin: Orthopedic wounds dressed. No rashes noted, warm and dry Chest: left lateral chest wall wounds pictured below. Small wound clean and tunnels about 4cm/ no purulent drainage or cellulitis. Larger wound with penrose drain in place, measures about 9x8cm and tracks about 15cm in all directions, there is some thin purulent drainage, mild periwound erythema without heat or induration. No significant cellulitis         Lab Results:  Recent Labs    02/12/21 0535 02/13/21 0551  WBC 15.5* 12.1*  HGB 8.8* 7.4*  HCT 26.9* 23.2*  PLT 544* 486*   BMET Recent Labs    02/12/21 0535 02/13/21 0551  NA 132* 129*  K 4.4 4.6  CL 95* 94*  CO2 30 28  GLUCOSE 136* 181*  BUN 7 7  CREATININE 0.52* 0.38*  CALCIUM 8.2* 8.3*   PT/INR No results for input(s): LABPROT, INR in the last 72 hours. CMP     Component Value Date/Time   NA 129 (L) 02/13/2021 0551   NA 137 10/09/2020 1155   K 4.6 02/13/2021 0551   CL 94 (L) 02/13/2021 0551   CO2 28 02/13/2021 0551   GLUCOSE 181 (H) 02/13/2021 0551   BUN 7 02/13/2021 0551   BUN 11 10/09/2020 1155   CREATININE 0.38 (L) 02/13/2021 0551   CALCIUM 8.3 (L) 02/13/2021 0551   PROT 5.2 (L) 02/13/2021 0551   PROT 7.6 10/09/2020 1155   ALBUMIN 1.8 (L) 02/13/2021 0551   ALBUMIN 4.7 10/09/2020 1155   AST 49 (H) 02/13/2021  0551   ALT 26 02/13/2021 0551   ALKPHOS 60 02/13/2021 0551   BILITOT 0.7 02/13/2021 0551   BILITOT 0.8 10/09/2020 1155   GFRNONAA >60 02/13/2021 0551   GFRAA 113 10/09/2020 1155   Lipase  No results found for: LIPASE     Studies/Results: No results found.  Anti-infectives: Anti-infectives (From admission, onward)    Start     Dose/Rate Route Frequency Ordered Stop   02/10/21 0727  ceFAZolin (ANCEF) 2-4 GM/100ML-% IVPB       Note to Pharmacy: Lyda Kalata   : cabinet override      02/10/21 0727 02/10/21 1856   02/01/21 0600  ceFAZolin (ANCEF) IVPB 2g/100 mL premix        2 g 200 mL/hr over 30 Minutes Intravenous Every 8 hours 02/01/21 0414     01/31/21 1800  cefTRIAXone (ROCEPHIN) 1 g in sodium chloride 0.9 % 100 mL IVPB  Status:  Discontinued        1 g 200 mL/hr over 30 Minutes Intravenous Every 24 hours 01/31/21 1453 02/01/21 0425   01/31/21 1115  vancomycin (VANCOREADY) IVPB 2000 mg/400 mL        2,000 mg 200 mL/hr over 120 Minutes Intravenous  Once 01/31/21 1105 01/31/21 1320   01/31/21 1045  ceFEPIme (MAXIPIME) 2 g in sodium chloride  0.9 % 100 mL IVPB        2 g 200 mL/hr over 30 Minutes Intravenous  Once 01/31/21 1035 01/31/21 1129   01/31/21 1045  metroNIDAZOLE (FLAGYL) IVPB 500 mg        500 mg 100 mL/hr over 60 Minutes Intravenous  Once 01/31/21 1035 01/31/21 1208   01/31/21 1045  vancomycin (VANCOCIN) IVPB 1000 mg/200 mL premix  Status:  Discontinued        1,000 mg 200 mL/hr over 60 Minutes Intravenous  Once 01/31/21 1035 01/31/21 1105        Assessment/Plan Complex left lateral chest wall and axillary abscess POD#3 S/p Incision and drainage of multiple left lateral chest wall abscesses along with excisional debridement of left lateral chest wall skin, soft tissue, muscle, and fascia and axilla 6/18 Dr. Andrey Campanile - Cx with abundant staphylococcus aureus, report pending - ID following and managing antibiotics - Wound not recommend any further debridement  at this time - Discussed and seen with WOCN. They feel it would be appropriate to try wound vac w/ penrose drain remiaining in place. They plan to place today.  - We will follow with you    ID - currently ancef 6/9>> Tmax 101.7. WBC down from 15.5 > 12.1. Lactic 1.7 FEN - regular diet VTE - lovenox Foley - none   Sepsis on admission, MSSA bacteremia Bilatearl shoulder abscess, s/p debridement 6/16 and 6/18 Endocarditis HTN DM Chronic back pain     LOS: 13 days    [name] , [facility] Surgery 02/13/2021, 9:45 AM Please see Amion for pager number during day hours 7:00am-4:30pm

## 2021-02-13 NOTE — Consult Note (Addendum)
WOC Nurse Consult Note: 08:30: Ortho team following for bilat shoulder wounds.  Reason for Consult: Assessed left chest wall wound with surgical PA. Vac dressing supplies ordered and patient medicated for pain. Discussed plan of care with surgical team. Penrose drain remains in place.  0930:  Wound type: 3 full thickness wounds: Full thickness postop wound to upper chest: Penrose drain remains in place. This location tunnels underneath the skin level with lower larger chest wound. Drape applied over site 2. Lower chest with full thickness post-op wound; 3X8X8cm, large amt tan-green drainage, bed moist inner wound, communicates underneath skin level with a tunnel which is approx 10cm. At 12:00 o'clock .Applied one piece white foam to inner tunneling area, then one piece of black foam to outer wound. 3. Outer chest with full thickness post-op wound; 1X4X5cm, does not communicate with other wounds. Applied one piece black foam, then bridged to other black foam after drape was applied.  Pt was in large amt pain despite pain meds given.  Cont suction on at with good seal.  Dressing procedure/placement/frequency: WOC nurse will change Vac dressing FRI, then begin M/W/F dressing changes next week.  Use white foam to upward tunneling area and 2 pieces of black foam bridged together for outer wounds.  Apply drape over penrose drain. Cammie Mcgee MSN, RN, CWOCN, [place], CNS [phone]

## 2021-02-13 NOTE — Progress Notes (Signed)
Pt seen by RR RN d/t a score of 9 in SIRS criteria. HR 97-100s, RR 22, temp 101.7, WBC 15.5. Sepsis early recognition sheet has been filled out about will be placed in director John Ware's box  Consulted w/ [facility] NP. LA and blood cultures ordered. Pt already on Ancef IV and UO is appropriate (creatinine 0.52).   No further interventions until LA results. Bps have been stable 115/67 (81).

## 2021-02-13 NOTE — Progress Notes (Signed)
Inpatient Diabetes Program Recommendations  AACE/ADA: New Consensus Statement on Inpatient Glycemic Control (2015)  Target Ranges:  Prepandial:   less than 140 mg/dL      Peak postprandial:   less than 180 mg/dL (1-2 hours)      Critically ill patients:  140 - 180 mg/dL   Lab Results  Component Value Date   GLUCAP 216 (H) 02/13/2021   HGBA1C 10.2 (H) 01/31/2021    Review of Glycemic Control Results for John Ware, John Ware (MRN [id number]) as of 02/13/2021 16:26  Ref. Range 02/12/2021 11:44 02/12/2021 16:31 02/12/2021 21:17 02/13/2021 07:38 02/13/2021 12:26  Glucose-Capillary Latest Ref Range: 70 - 99 mg/dL 637 (H) 858 (H) 850 (H) 154 (H) 216 (H)   Diabetes history: DM 2 Outpatient Diabetes medications:  Metformin 1500 mg daily Current orders for Inpatient glycemic control:  Novolog moderate tid with meals and HS Lantus 15 units daily Novolog 3 units tid with meals  Inpatient Diabetes Program Recommendations:    Consider increasing Novolog meal coverage to 4 units tid with meals.   Thanks  Beryl Meager, RN, BC-ADM Inpatient Diabetes Coordinator Pager [phone]  (8a-5p)

## 2021-02-13 NOTE — Progress Notes (Signed)
Subjective:   Patient is alert, oriented. States pain in shoulders is moderate-severe, worse with movement and worse with left than right. No improvement from yesterday.  Objective:  PE: VITALS:   Vitals:   02/13/21 0222 02/13/21 0441 02/13/21 0900 02/13/21 1316  BP:  117/78 116/80 (!) 147/91  Pulse:  89 87 (!) 107  Resp:  20 20 20   Temp: 100.1 F (37.8 C) 99.7 F (37.6 C) 98.7 F (37.1 C) (!) 100.6 F (38.1 C)  TempSrc: Axillary Oral Oral Oral  SpO2:  100% 100% 100%  Weight:      Height:       0-10 degrees of active forward flexion of bilateral shoulders, limited by pain. 0-90 deg PROM left, 0-40 PROM right, limited by pain. No erythema. Shoulders warm to touch. No drainage seen on bulky dressings. Distal sensation and capillary refill intact.   LABS  Results for orders placed or performed during the hospital encounter of 01/31/21 (from the past 24 hour(s))  Glucose, capillary     Status: Abnormal   Collection Time: 02/12/21  4:31 PM  Result Value Ref Range   Glucose-Capillary 231 (H) 70 - 99 mg/dL  Glucose, capillary     Status: Abnormal   Collection Time: 02/12/21  9:17 PM  Result Value Ref Range   Glucose-Capillary 248 (H) 70 - 99 mg/dL  CBC with Differential/Platelet     Status: Abnormal   Collection Time: 02/13/21  5:51 AM  Result Value Ref Range   WBC 12.1 (H) 4.0 - 10.5 K/uL   RBC 2.39 (L) 4.22 - 5.81 MIL/uL   Hemoglobin 7.4 (L) 13.0 - 17.0 g/dL   HCT 02/15/21 (L) 23.5 - 57.3 %   MCV 97.1 80.0 - 100.0 fL   MCH 31.0 26.0 - 34.0 pg   MCHC 31.9 30.0 - 36.0 g/dL   RDW 22.0 (H) 25.4 - 27.0 %   Platelets 486 (H) 150 - 400 K/uL   nRBC 0.0 0.0 - 0.2 %   Neutrophils Relative % 77 %   Neutro Abs 9.4 (H) 1.7 - 7.7 K/uL   Lymphocytes Relative 15 %   Lymphs Abs 1.8 0.7 - 4.0 K/uL   Monocytes Relative 6 %   Monocytes Absolute 0.7 0.1 - 1.0 K/uL   Eosinophils Relative 0 %   Eosinophils Absolute 0.0 0.0 - 0.5 K/uL   Basophils Relative 1 %   Basophils Absolute 0.1  0.0 - 0.1 K/uL   Immature Granulocytes 1 %   Abs Immature Granulocytes 0.08 (H) 0.00 - 0.07 K/uL  Comprehensive metabolic panel     Status: Abnormal   Collection Time: 02/13/21  5:51 AM  Result Value Ref Range   Sodium 129 (L) 135 - 145 mmol/L   Potassium 4.6 3.5 - 5.1 mmol/L   Chloride 94 (L) 98 - 111 mmol/L   CO2 28 22 - 32 mmol/L   Glucose, Bld 181 (H) 70 - 99 mg/dL   BUN 7 6 - 20 mg/dL   Creatinine, Ser 02/15/21 (L) 0.61 - 1.24 mg/dL   Calcium 8.3 (L) 8.9 - 10.3 mg/dL   Total Protein 5.2 (L) 6.5 - 8.1 g/dL   Albumin 1.8 (L) 3.5 - 5.0 g/dL   AST 49 (H) 15 - 41 U/L   ALT 26 0 - 44 U/L   Alkaline Phosphatase 60 38 - 126 U/L   Total Bilirubin 0.7 0.3 - 1.2 mg/dL   GFR, Estimated 7.62 >83 mL/min   Anion gap 7 5 -  15  Magnesium     Status: None   Collection Time: 02/13/21  5:51 AM  Result Value Ref Range   Magnesium 1.7 1.7 - 2.4 mg/dL  Phosphorus     Status: None   Collection Time: 02/13/21  5:51 AM  Result Value Ref Range   Phosphorus 4.3 2.5 - 4.6 mg/dL  Lactic acid, plasma     Status: None   Collection Time: 02/13/21  5:51 AM  Result Value Ref Range   Lactic Acid, Venous 1.7 0.5 - 1.9 mmol/L  Glucose, capillary     Status: Abnormal   Collection Time: 02/13/21  7:38 AM  Result Value Ref Range   Glucose-Capillary 154 (H) 70 - 99 mg/dL  Glucose, capillary     Status: Abnormal   Collection Time: 02/13/21 12:26 PM  Result Value Ref Range   Glucose-Capillary 216 (H) 70 - 99 mg/dL    No results found.  Assessment/Plan: Bilateral shoulder septic arthritis - L shoulder irrigation and debridement 6/16 - R shoulder irrigation and debridement with distal clavicle resection 6/17  -dressings changed, continue daily dressing changes - ordered repeat CT scans of bilateral shoulder and chest to watch progression of septic arthritis and axilla abscess, repeat operative intervention will be based on these results - WBAT bilateral shoulders, can use slings for comfort if he  likes  Contact information:   Weekdays 8-5 Janine Ores, PA-C [phone] A fter hours and holidays please check [email or web address] for group call information for [facility]  Armida Sans 02/13/2021, 3:42 PM

## 2021-02-13 NOTE — Telephone Encounter (Signed)
Per Dr. Jena Gauss patient needs OV before he can be scheduled  Please schedule and mail letter thanks

## 2021-02-13 NOTE — Progress Notes (Signed)
PROGRESS NOTE    John Ware  RSW:[id number] DOB: 1965-10-12 DOA: 01/31/2021 PCP: Sharion Balloon, FNP   Brief Narrative:  Patient is a 55 year old obese Caucasian male with past medical history significant for but not limited to chronic pain, hypertension, diabetes mellitus type 2 as well as other comorbidities who presented with weakness.  History is obtained from the wife as he is a poor historian but she reported that he had been generally unwell for the past 3 weeks or so.  He had at home positive COVID test on 01/12/2021.  He had no respiratory symptoms at this time but he did have fevers that was responsive to Tylenol.  Reportedly he apparently fell out of his chair and hurt his left shoulder and he was evaluated by the ED and they found no fracture and he was given a sling and told to follow-up with orthopedics.  Since that time the patient's wife reports that he has had general body aches and pain and has increased symptoms that are not responsive to his chronic pain meds.  He is also had poor appetite during this time and has been unable to walk for last few days.  Because of these constantly some symptoms he is brought to the ED for further evaluation.  In the ED is found to have a sodium of 113 and a glucose of 339 and a WBC of 20.5 as well as a lactic acid of 3.8.  He started on broad-spectrum antibiotics and given fluids.  Soon after his blood cultures became positive for 4-4 MSSA.  He was transitioned to IV Ancef.  With further work-up and confirmation he was also found to have a large fluid collection of left lateral posterior chest and back.  He did have tenderness in his back.  He underwent MRI T-spine and CT of the chest abdomen pelvis.  There is no evidence of any osteomyelitis or discitis or other underlying infection on MRI T-spine.  CT of the chest did show a large multiloculated fluid collection.  IR was consulted and he underwent an ultrasound-guided aspiration was sent for  culturing.  CT was concerning for evidence of septic emboli but 2D echo did not reveal any evidence of an endocarditis.  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  Subsequently he still complained of some shoulder pain so he underwent bilateral MRIs of his shoulders and he is still having fevers.  At the left shoulder was concerned for septic arthritis and left axillary abscess.  Orthopedics was reconsulted as well as IR and the orthopedic surgeon is planning to do a washout tomorrow in edition IR is going to place a drain on the residual fluid collection that may exist after washout.  Because of the surgery ID changed the patient to nafcillin however recommending continuing cefazolin at this time. ESR and CRP and both are elevated.  Patient underwent on 02/08/21 left shoulder excisional debridement, deep abscess with arthrotomy and irrigation debridement of the joint with biceps tenotomy and excision of the necrotic rotator cuff.  Patient is not medically ready to be D/C'd to CIR.   Postoperatively he had a CT scan and repeated and showed a marked increase in size of the abscess collection at the left lateral chest wall with persistent abscess again seen in the subscapular extending cranial to the apex of the left hemithorax.  Since he is post I&D it  was unclear about how much of the increase in size of the abscess was due to the postsurgical changes progression of the abscess collection.  Patient did have some small bilateral pleural effusions and bibasilar atelectasis as well as reactive left axillary adenopathy.  Post surgery patient was given Decadron 10 mg last night.  Because of the changes on his repeat CT scan with a left chest wall abscess with gas on CT General surgery was consulted and they are planning on taking the patient back to the OR to address his right shoulder along with  a large left lateral chest wall abscess and this was done 6/18.  ID recommends continuing IV cefazolin currently  On 02/12/21 he had his Dressing Changes done. Cx showing Abundant Staph Aureus only resistant to Ciprofloxacin and Erythomycin. Surgery recommending no further debridement at this time and start BID Dressing changes.  Overnight he had a Rapid Response given his elevated Fever and Vitals. Surgery now placed Wound VAC and Orthopedic Surgery now ordering repeat CT Scan of the Chest and Shoulders to evaluate for any undrained abscesses. [place] nurse evaluated    Assessment & Plan:   Active Problems:   Diabetes mellitus (HCC)   Hyponatremia   Pressure injury of skin   Bacteremia due to Staphylococcus aureus   HTN (hypertension)   Anxiety   Left shoulder pain   Ascending aorta dilatation (HCC)   Vision changes   Endocarditis  Severe sepsis ([place]),  - 2/2 bacteremia, improving slowly with multiple seeding areas - patient is still spiking temperatures intermittently and had a Fever overnight of 100.9; Will need to continue monitor Temperature Curve and CBC  -WBC is now trending down from 21.4 -> 18.8 -> 18.5 -> 18.3 -> 13.0 -> 15.5 -> 12.1 -ID recommends continuing Cefazolin  -See below for Surgeries; Ortho repeating Scans of shoulders and Chest    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 This would be start date of abx likely. Final rec's per ID for length of duration of Ancef, likely 6 weeks; he would be a candidate for a PICC and if accepted to CIR they could continue abx there if approved but he is not medically stable yet    Bacteremia due to Staphylococcus aureus - 4/4 blood cultures on admission growing MSSA -Repeated blood cultures on 02/01/2021; NGTD - TTE negative for vegetations; cardiology consulted for TEE, 02/05/21: POSITIVE for TV  vegetation - now s/p IR drainage of left posterior chest wall fluid collection which was noted to be purulent in nature; culture also growing MSSA - MRI T spine 02/01/21 negative for OM/discitis  - MRI brain 6/12 negative for pathology in visual cortex to explain "floaters" in visual fields - continue ancef for now  - ID following and appreciate recc's recommending continuing cefazolin for now   Hyponatremia - Initially presumed due to dehydration and hypokalemia - severely low on admission, 113 -Improved now and is trending up; remains 132 again yesterday but dropped to 129 today   - will check tsh, repeat osm and urine osm, urine sodium. Cortisol wnl when checked earlier this admission, will repeat tomorrow with acth stim test. Reset osmostat possible - may need fluid restriction   Bilateral  shoulder pain - See work-up above - Was previously seen in the ER after his fall and recommended for a sling with outpatient Ortho follow-up -Bilateral  shoulder x-rays unremarkable for now.   - MRI of shoulders to be performed today to eval for abscess/osteo; Left Shoulder MRI showed "Findings concerning for glenohumeral and acromioclavicular septic arthritis with septic subacromial/subdeltoid and  subcoracoid bursitis. No osteomyelitis. Large abscess in the left axilla, incompletely visualized. Full-thickness, partial width tear of the anterior supraspinatus tendon with 2.2 cm retraction to the top of  the humeral head. Additional high-grade partial-thickness articular surface tear of the posterior  supraspinatus tendon. High-grade partial-thickness articular surface tear of the superior subscapularis  tendon. complete tear of the biceps long head tendon within the bicipital groove." -Right should MRI done showed "No convincing evidence of septic arthritis. Moderate amount of fluid in  the subacromial/subdeltoid bursa and subcoracoid bursa is favored related to underlying rotator cuff  pathology described  below rather than septic bursitis, although fluid sampling could be obtained for  definitive diagnosis. Full-thickness, essentially full width tear of the supraspinatus tendon with 2.1 cm retraction to the top of the humeral head. No muscle atrophy. High-grade partial-thickness articular  surface tear of the mid to distal infraspinatus tendon. Partially torn long head biceps tendon within the  bicipital groove. Moderate acromioclavicular and mild glenohumeral osteoarthritis." -Orthopedic surgery evaluated and took the patient to the OR last night and addressed the left shoulder as he had "Left shoulder excisional debridement, deep abscess, with arthrotomy and irrigation and debridement of the joint with biceps tenotomy and excision of necrotic rotator cuff" -Right shoulder was addressed today and patient underwent an "Right shoulder excisional debridement, including biceps tenolysis. Evacuation of purulent abscess, right subacromial space, as well as subcoracoid bursa. Right shoulder arthrotomy with irrigation and debridement of joint.  Right shoulder open distal clavicle resection" -Pain Control and will increase Morphine Frequency 2 mg from q4hprn to q2hprn Severe Pain -Orthopedic Surgery repeating CT shoulders and Chest and these are pending   Left Lateral Chest Wall Abscess -CT Scan showed "Marked increase in size of abscess collection at the LEFT lateral chest wall with persistent abscess again seen subscapular extending cranial to the apex of the LEFT hemithorax as described above. Since patient is post I & D of the chest wall abscess, it is uncertain how much of the increase in size of the abscess is due to postsurgical changes versus progression of abscess collection   Small BILATERAL pleural effusions and bibasilar atelectasis. Reactive LEFT axillary adenopathy." -General surgery evaluated and patient does have a large 17 x 16 x 6 cm fluid collection in the left lateral chest wall that does not  have any communication to the actual thorax cavity.  General surgery feel this needs to be drained and he was taken to the OR by Dr. Andrey Campanile 02/10/21. -He is s/p "Incision and drainage of multiple left lateral chest wall abscesses along with excisional debridement of left lateral chest wall skin, soft tissue, muscle, and fascia and axilla." -Surgery did dressing change this AM on 02/12/21 he had his Dressing Changes done. Cx showing Abundant Staph Aureus but Sensitivities pending and likely MSSA. Surgery recommending no further debridement at this time and start BID Dressing changes (Wet to Dry) -Cx Showing Abundant Staphylococcus Aures that is Resistant to Ciprofloxacin and Erythromycin -As Above Wound Vac now in place and W[facility] nurse consulted. BID Wet to Dry Dressing Changes and Orthopedic Surgery repeating Scans of shoulders and chest    Vision changes - Patient now stating on 02/04/2021 that he has been seeing "black floaters" in his field of vision for several days.  When questioned why he did not bring this up sooner, he did not have a good answer and did not want to "trouble [place]" - ophtho evaluated 6/13, no endophthalmitis   DKA (diabetic ketoacidosis) (HCC)-resolved as of 02/02/2021 - present on admission - responded to fluids and insulin - now resolved -Lantus 15 units subcu daily as well as moderate NovoLog sliding scale insulin before meals and at bedtime and will add 3 units of NovoLog 3 times daily if the patient eats greater than 50% of his meals -He is given IV Decadron once last night his blood sugars went up so we will need to continue monitor carefully as CBGs are now ranging from 154-248   Ascending aorta dilatation (HCC) - TTE on 6/10 notes mild dilation of ascending aorta, 38 mm - follow TEE results as well: normal thoracic and ascending aorta noted on 02/05/21  Hyperkalemia, improved  -Patient was given a dose of Lokelma earlier on this Admission  -Patient's potassium now  4.4 -Continue to monitor and trend and repeat CMP in the a.m.   Anxiety - Wellbutrin and Zoloft on hold for now in setting of hyponatremia   HTN (hypertension) - Pressure normal on admission.  Will treat if needed - Continue to Monitor BP per Protocol -Last BP was 147/91   Diabetes mellitus (HCC) - Continue SSI and CBG monitoring -C/w Lantus 15 units subcu daily as well as moderate NovoLog sliding scale insulin before meals and at bedtime and will add 3 units of NovoLog 3 times daily if the patient eats greater than 50% of his meals and increase it to 4 units TIDwm - A1c 10.2% on 01/31/2021 -CBGs ranging from 154-248 -Diabetes Education Coordinator Following    Obesity -Complicates overall prognosis and care -Estimated body mass index is 30.78 kg/m as calculated from the following:   Height as of this encounter: $RemoveBeforeD'5\' 10"'mepMogTWYvEtib$  (1.778 m).   Weight as of this encounter: 97.3 kg. -Weight Loss and Dietary Counseling given   Right Knee Pain, improved  -Obtained X-Ray and showed "Medial joint space narrowing is noted with mild osteophytic change. Mild patellofemoral changes are noted as well. No joint effusion is seen. No acute fracture is noted."  Normocytic Anemia -Relatively Stable -Patient's Hgb/Hct went from 10.5/29.9 -> 10.8/31.0 -> 10.1/28.7 -> 9.6/28.1 -> 10.2/30.4 -> 9.4/27.9 -> 7.9/24.4 -> 8.8/26.9 -> 7.4/23.2 -Check Anemia Panel in the AM  -Continue to Monitor for S/Sx of Bleeding  -Repeat CBC in the AM   Thrombocytosis -Likely Reactive -Patient's Platelet Count went from 397 -> 414 -> 427 -> 444 -> 460 -> 536 -> 479 -> 544 -> 486 -Continue to Monitor and Trend -Repeat CBC in the AM  DVT prophylaxis: Enoxaparin 40 mg subcu every 24 Code Status: FULL CODE  Family Communication: Discussed with wife at bedside  Disposition Plan: Pending further work-up and clearance by specialists; transferred to Medical Floor  Status is: Inpatient  Remains inpatient appropriate  because:Unsafe d/c plan, IV treatments appropriate due to intensity of illness or inability to take PO, and Inpatient level of care appropriate due to severity of illness  Dispo: The patient is from: Home              Anticipated d/c is to: CIR vs SNF              Patient currently is not medically stable to d/c.   Difficult to place patient No  Consultants:  Infectious Diseases Orthopedic Surgery Interventional Radiology  CIR Cardiology  Procedures:  TEE Results: Normal LV size and function Normal RV size and function Normal RA Normal LA and LA appendage Abnormal TV with large filamentous highly mobile density predominantly on the ventricular side of the TV leaflets consistent with vegetation.  There is mild associated TR. Normal PV Normal MV with trivial MR Normal trileaflet AV with trivial AR Normal interatrial septum with no evidence of shunt by colorflow dopper Normal thoracic and ascending aorta.    PROCEDURE 02/08/21 by Dr. Mardelle Matte: Left shoulder excisional debridement, deep abscess, with arthrotomy and irrigation and debridement of the joint with biceps tenotomy and excision of necrotic rotator cuff  PROCEDURE 02/10/21 by Dr. Mardelle Matte:     1.  Right shoulder excisional debridement, including biceps tenolysis 2.  Evacuation of purulent abscess, right subacromial space, as well as subcoracoid bursa 3.  Right shoulder arthrotomy with irrigation and debridement of joint 4.  Right shoulder open distal clavicle resection  PROCEDURE 02/10/21 by Dr. Redmond Pulling:  Incision and drainage of multiple left lateral chest wall abscesses along with excisional debridement of left lateral chest wall skin, soft tissue, muscle, and fascia and axilla.  Antimicrobials:  Anti-infectives (From admission, onward)    Start     Dose/Rate Route Frequency Ordered Stop   02/10/21 0727  ceFAZolin (ANCEF) 2-4 GM/100ML-% IVPB       Note to Pharmacy: Darlys Gales   : cabinet override      02/10/21 0727  02/10/21 1856   02/01/21 0600  ceFAZolin (ANCEF) IVPB 2g/100 mL premix        2 g 200 mL/hr over 30 Minutes Intravenous Every 8 hours 02/01/21 0414     01/31/21 1800  cefTRIAXone (ROCEPHIN) 1 g in sodium chloride 0.9 % 100 mL IVPB  Status:  Discontinued        1 g 200 mL/hr over 30 Minutes Intravenous Every 24 hours 01/31/21 1453 02/01/21 0425   01/31/21 1115  vancomycin (VANCOREADY) IVPB 2000 mg/400 mL        2,000 mg 200 mL/hr over 120 Minutes Intravenous  Once 01/31/21 1105 01/31/21 1320   01/31/21 1045  ceFEPIme (MAXIPIME) 2 g in sodium chloride 0.9 % 100 mL IVPB        2 g 200 mL/hr over 30 Minutes Intravenous  Once 01/31/21 1035 01/31/21 1129   01/31/21 1045  metroNIDAZOLE (FLAGYL) IVPB 500 mg        500 mg 100 mL/hr over 60 Minutes Intravenous  Once 01/31/21 1035 01/31/21 1208   01/31/21 1045  vancomycin (VANCOCIN) IVPB 1000 mg/200 mL premix  Status:  Discontinued        1,000 mg 200 mL/hr over 60 Minutes Intravenous  Once 01/31/21 1035 01/31/21 1105        Subjective: Seen and examined at bedside and he states that he is feeling extremely bad today and was complaining of significant pain.  No nausea or vomiting but had fever last night.  Denies any lightheadedness or dizziness.  No other concerns or complaints at this time but states he had significant pain with his dressing changes.   Objective: Vitals:   02/13/21 0222 02/13/21 0441 02/13/21 0900 02/13/21 1316  BP:  117/78 116/80 (!) 147/91  Pulse:  89 87 (!) 107  Resp:  $Remo'20 20 20  'iKZCP$ Temp: 100.1 F (37.8 C) 99.7 F (37.6 C) 98.7 F (37.1 C) (!) 100.6 F (38.1 C)  TempSrc: Axillary Oral Oral Oral  SpO2:  100% 100% 100%  Weight:      Height:  Intake/Output Summary (Last 24 hours) at 02/13/2021 1616 Last data filed at 02/13/2021 0343 Gross per 24 hour  Intake --  Output 200 ml  Net -200 ml    Filed Weights   01/31/21 1400 02/05/21 0500 02/08/21 1540  Weight: 89.9 kg 97.3 kg 97.3 kg    Examination: Physical Exam:  Constitutional: WN/WD obese Caucasian male in moderate distress and appears uncomfortable and in pain Eyes: Lids and conjunctivae normal, sclerae anicteric  ENMT: External Ears, Nose appear normal. Grossly normal hearing. No appreciable JVD Neck: Appears normal, supple, no cervical masses, normal ROM, no appreciable thyromegaly Respiratory: Diminished to auscultation bilaterally with coarse breath sounds, no wheezing, rales, rhonchi or crackles. Normal respiratory effort and patient is not tachypenic. No accessory muscle use. Unlabored breathing  Cardiovascular: RRR, no murmurs / rubs / gallops. S1 and S2 auscultated. 1+ LE edema Abdomen: Soft, non-tender, non-distended. Bowel sounds positive.  GU: Deferred. Musculoskeletal: No clubbing / cyanosis of digits/nails. No joint deformity upper and lower extremities.  Skin: No rashes on a limited skin evaluation and Has a Wound Vac and Bilateral Shoulder Dressings. No induration; Warm and dry.  Neurologic: CN 2-12 grossly intact with no focal deficits. Romberg sign and cerebellar reflexes not assessed.  Psychiatric: Normal judgment and insight. Alert and oriented x 3. Normal mood and appropriate affect.   Data Reviewed: I have personally reviewed following labs and imaging studies  CBC: Recent Labs  Lab 02/09/21 0502 02/10/21 0546 02/11/21 0539 02/12/21 0535 02/13/21 0551  WBC 18.5* 18.3* 13.0* 15.5* 12.1*  NEUTROABS 17.2* 15.7* 10.6* 12.6* 9.4*  HGB 10.2* 9.4* 7.9* 8.8* 7.4*  HCT 30.4* 27.9* 24.4* 26.9* 23.2*  MCV 91.8 92.1 96.8 94.7 97.1  PLT 460* 536* 479* 544* 486*    Basic Metabolic Panel: Recent Labs  Lab 02/09/21 0502 02/10/21 0546 02/11/21 0539 02/12/21 0535 02/13/21 0551  NA 132* 130* 132* 132* 129*  K 5.2* 4.9 4.6 4.4 4.6  CL 96* 95* 97* 95* 94*  CO2 $Re'30 27 28 30 28  'plr$ GLUCOSE 405* 168* 137* 136* 181*  BUN $Re'13 10 8 7 7  'aya$ CREATININE 0.69 0.68 0.42* 0.52* 0.38*  CALCIUM 8.0* 7.8* 7.8*  8.2* 8.3*  MG 1.9 1.8 1.7 1.8 1.7  PHOS 5.0* 3.6 4.3 4.1 4.3    GFR: Estimated Creatinine Clearance: 124.9 mL/min (A) (by C-G formula based on SCr of 0.38 mg/dL (L)). Liver Function Tests: Recent Labs  Lab 02/09/21 0502 02/10/21 0546 02/11/21 0539 02/12/21 0535 02/13/21 0551  AST 35 35 25 29 49*  ALT $Re'25 19 15 17 26  'JOW$ ALKPHOS 95 73 55 61 60  BILITOT 0.7 0.8 0.7 0.9 0.7  PROT 5.4* 5.0* 4.9* 5.5* 5.2*  ALBUMIN 1.7* 1.8* 1.9* 2.0* 1.8*    No results for input(s): LIPASE, AMYLASE in the last 168 hours. No results for input(s): AMMONIA in the last 168 hours. Coagulation Profile: No results for input(s): INR, PROTIME in the last 168 hours.  Cardiac Enzymes: No results for input(s): CKTOTAL, CKMB, CKMBINDEX, TROPONINI in the last 168 hours. BNP (last 3 results) No results for input(s): PROBNP in the last 8760 hours. HbA1C: No results for input(s): HGBA1C in the last 72 hours. CBG: Recent Labs  Lab 02/12/21 1144 02/12/21 1631 02/12/21 2117 02/13/21 0738 02/13/21 1226  GLUCAP 187* 231* 248* 154* 216*    Lipid Profile: No results for input(s): CHOL, HDL, LDLCALC, TRIG, CHOLHDL, LDLDIRECT in the last 72 hours. Thyroid Function Tests: No results for input(s): TSH, T4TOTAL, FREET4, T3FREE,  THYROIDAB in the last 72 hours.  Anemia Panel: No results for input(s): VITAMINB12, FOLATE, FERRITIN, TIBC, IRON, RETICCTPCT in the last 72 hours. Sepsis Labs: Recent Labs  Lab 02/13/21 0551  LATICACIDVEN 1.7     Recent Results (from the past 240 hour(s))  Fungus Culture With Stain     Status: None (Preliminary result)   Collection Time: 02/08/21  6:48 PM   Specimen: Synovial, Left Shoulder; Body Fluid  Result Value Ref Range Status   Fungus Stain Final report  Final    Comment: (NOTE) Performed At: [facility] [address], [place] [id number] Rush Farmer MD [id number]    Fungus (Mycology) Culture PENDING  Incomplete   Fungal Source SHOULDER  Final     Comment: LEFT Performed at [facility], [place] [address], [place], [place] [address]   Aerobic/Anaerobic Culture w Gram Stain (surgical/deep wound)     Status: None (Preliminary result)   Collection Time: 02/08/21  6:48 PM   Specimen: Synovial, Left Shoulder; Body Fluid  Result Value Ref Range Status   Specimen Description   Final    SHOULDER LEFT Performed at [place] [address], [place], [place] [address]    Special Requests   Final    NONE Performed at [facility], [place] [address], [place], [place] [address]    Gram Stain   Final    ABUNDANT WBC PRESENT,BOTH PMN AND MONONUCLEAR RARE GRAM POSITIVE COCCI IN PAIRS Performed at [facility], [place] [address], [place], [place] [address]    Culture   Final    RARE STAPHYLOCOCCUS AUREUS NO ANAEROBES ISOLATED; CULTURE IN PROGRESS FOR 5 DAYS    Report Status PENDING  Incomplete   Organism ID, Bacteria STAPHYLOCOCCUS AUREUS  Final      Susceptibility   Staphylococcus aureus - MIC*    CIPROFLOXACIN >=8 RESISTANT Resistant     ERYTHROMYCIN >=8 RESISTANT Resistant     GENTAMICIN <=0.5 SENSITIVE Sensitive     OXACILLIN 0.5 SENSITIVE Sensitive     TETRACYCLINE <=1 SENSITIVE Sensitive     VANCOMYCIN 1 SENSITIVE Sensitive     TRIMETH/SULFA <=10 SENSITIVE Sensitive     CLINDAMYCIN <=0.25 SENSITIVE Sensitive     RIFAMPIN <=0.5 SENSITIVE Sensitive     Inducible Clindamycin NEGATIVE Sensitive     * RARE STAPHYLOCOCCUS AUREUS  Fungus Culture Result     Status: None   Collection Time: 02/08/21  6:48 PM  Result Value Ref Range Status   Result 1 Comment  Final    Comment: (NOTE) KOH/Calcofluor preparation:  no fungus observed. Performed At: [facility] [place], [place] [id number] Rush Farmer MD [id number]   Fungus Culture With Stain     Status: None (Preliminary result)   Collection Time: 02/08/21  6:52 PM   Specimen: Joint, Other; Body  Fluid  Result Value Ref Range Status   Fungus Stain Final report  Final    Comment: (NOTE) Performed At: [facility] [place], [place] [id number] Rush Farmer MD [id number]    Fungus (Mycology) Culture PENDING  Incomplete   Fungal Source SHOULDER  Final    Comment: LEFT BONE CHIPS Performed at [facility], [place] [address], [place],  [address]   Aerobic/Anaerobic Culture w Gram Stain (surgical/deep wound)     Status: None (Preliminary result)   Collection Time: 02/08/21  6:52 PM   Specimen: Joint, Other; Body Fluid  Result Value Ref  Range Status   Specimen Description   Final    SHOULDER LEFT BONE CHIPS Performed at [place] [address] [place], [place] [address]    Special Requests   Final    NONE Performed at [facility], [place] [address], [place], [place] [address]    Gram Stain   Final    NO WBC SEEN NO ORGANISMS SEEN Performed at [facility], [place] [address] [place], [place] [address]    Culture   Final    RARE STAPHYLOCOCCUS AUREUS NO ANAEROBES ISOLATED; CULTURE IN PROGRESS FOR 5 DAYS    Report Status PENDING  Incomplete   Organism ID, Bacteria STAPHYLOCOCCUS AUREUS  Final      Susceptibility   Staphylococcus aureus - MIC*    CIPROFLOXACIN >=8 RESISTANT Resistant     ERYTHROMYCIN >=8 RESISTANT Resistant     GENTAMICIN <=0.5 SENSITIVE Sensitive     OXACILLIN 0.5 SENSITIVE Sensitive     TETRACYCLINE <=1 SENSITIVE Sensitive     VANCOMYCIN 1 SENSITIVE Sensitive     TRIMETH/SULFA <=10 SENSITIVE Sensitive     CLINDAMYCIN <=0.25 SENSITIVE Sensitive     RIFAMPIN <=0.5 SENSITIVE Sensitive     Inducible Clindamycin NEGATIVE Sensitive     * RARE STAPHYLOCOCCUS AUREUS  Fungus Culture Result     Status: None   Collection Time: 02/08/21  6:52 PM  Result Value Ref Range Status   Result 1 Comment  Final    Comment: (NOTE) KOH/Calcofluor preparation:  no fungus  observed. Performed At: [facility] [place], [place] [id number] Rush Farmer MD OZ:[id number]   Aerobic/Anaerobic Culture w Gram Stain (surgical/deep wound)     Status: None (Preliminary result)   Collection Time: 02/10/21 10:22 AM   Specimen: Wound  Result Value Ref Range Status   Specimen Description   Final    WOUND CHEST Performed at [place] [address], [place], [place] [address]    Special Requests   Final    NONE Performed at [facility], [place] [address], [place], [place] [address]    Gram Stain   Final    ABUNDANT WBC PRESENT,BOTH PMN AND MONONUCLEAR MODERATE GRAM POSITIVE COCCI Performed at [facility], [place] [address] [place], [place] [address]    Culture   Final    ABUNDANT STAPHYLOCOCCUS AUREUS NO ANAEROBES ISOLATED; CULTURE IN PROGRESS FOR 5 DAYS    Report Status PENDING  Incomplete   Organism ID, Bacteria STAPHYLOCOCCUS AUREUS  Final      Susceptibility   Staphylococcus aureus - MIC*    CIPROFLOXACIN >=8 RESISTANT Resistant     ERYTHROMYCIN >=8 RESISTANT Resistant     GENTAMICIN <=0.5 SENSITIVE Sensitive     OXACILLIN 0.5 SENSITIVE Sensitive     TETRACYCLINE <=1 SENSITIVE Sensitive     VANCOMYCIN 1 SENSITIVE Sensitive     TRIMETH/SULFA <=10 SENSITIVE Sensitive     CLINDAMYCIN <=0.25 SENSITIVE Sensitive     RIFAMPIN <=0.5 SENSITIVE Sensitive     Inducible Clindamycin NEGATIVE Sensitive     * ABUNDANT STAPHYLOCOCCUS AUREUS      RN Pressure Injury Documentation: Pressure Injury 01/31/21 Buttocks Right;Left;Posterior Stage 2 -  Partial thickness loss of dermis presenting as a shallow open injury with a red, pink wound bed without slough. (Active)  01/31/21 1546  Location: Buttocks  Location Orientation: Right;Left;Posterior  Staging: Stage 2 -  Partial thickness loss of dermis presenting as a shallow open  injury with a red, pink wound bed without slough.  Wound Description  (Comments):   Present on Admission: Yes     Pressure Injury (Active)     Location:   Location Orientation:   Staging:   Wound Description (Comments):   Present on Admission:      Estimated body mass index is 30.78 kg/m as calculated from the following:   Height as of this encounter: $RemoveBeforeD'5\' 10"'[id number]$  (1.778 m).   Weight as of this encounter: 97.3 kg.  Malnutrition Type:   Malnutrition Characteristics:   Nutrition Interventions:    Radiology Studies: No results found.  Scheduled Meds:  Chlorhexidine Gluconate Cloth  6 each Topical Daily   enoxaparin (LOVENOX) injection  40 mg Subcutaneous Q24H   gabapentin  800 mg Oral TID   insulin aspart  0-15 Units Subcutaneous TID WC   insulin aspart  0-5 Units Subcutaneous QHS   insulin aspart  3 Units Subcutaneous TID WC   insulin glargine  15 Units Subcutaneous Daily   mouth rinse  15 mL Mouth Rinse BID   oxyCODONE  20 mg Oral Q12H   polyethylene glycol  17 g Oral Daily   povidone-iodine  2 application Topical Once   senna-docusate  1 tablet Oral BID   Continuous Infusions:  sodium chloride Stopped (02/07/21 0508)    ceFAZolin (ANCEF) IV 2 g (02/13/21 0916)    LOS: 13 days   Kerney Elbe, DO [facility] PAGER is on AMION  If 7PM-7AM, please contact night-coverage [email or web address]

## 2021-02-14 ENCOUNTER — Inpatient Hospital Stay (HOSPITAL_COMMUNITY): Payer: Commercial Managed Care - PPO

## 2021-02-14 ENCOUNTER — Encounter (HOSPITAL_COMMUNITY): Payer: Self-pay | Admitting: Orthopedic Surgery

## 2021-02-14 DIAGNOSIS — M25511 Pain in right shoulder: Secondary | ICD-10-CM

## 2021-02-14 DIAGNOSIS — D649 Anemia, unspecified: Secondary | ICD-10-CM

## 2021-02-14 DIAGNOSIS — D75839 Thrombocytosis, unspecified: Secondary | ICD-10-CM

## 2021-02-14 DIAGNOSIS — E875 Hyperkalemia: Secondary | ICD-10-CM

## 2021-02-14 DIAGNOSIS — E669 Obesity, unspecified: Secondary | ICD-10-CM

## 2021-02-14 DIAGNOSIS — L02213 Cutaneous abscess of chest wall: Secondary | ICD-10-CM

## 2021-02-14 LAB — CBC WITH DIFFERENTIAL/PLATELET
Abs Immature Granulocytes: 0.11 10*3/uL — ABNORMAL HIGH (ref 0.00–0.07)
Basophils Absolute: 0 10*3/uL (ref 0.0–0.1)
Basophils Relative: 0 %
Eosinophils Absolute: 0.1 10*3/uL (ref 0.0–0.5)
Eosinophils Relative: 1 %
HCT: 23.2 % — ABNORMAL LOW (ref 39.0–52.0)
Hemoglobin: 7.6 g/dL — ABNORMAL LOW (ref 13.0–17.0)
Immature Granulocytes: 1 %
Lymphocytes Relative: 15 %
Lymphs Abs: 1.6 10*3/uL (ref 0.7–4.0)
MCH: 31 pg (ref 26.0–34.0)
MCHC: 32.8 g/dL (ref 30.0–36.0)
MCV: 94.7 fL (ref 80.0–100.0)
Monocytes Absolute: 0.7 10*3/uL (ref 0.1–1.0)
Monocytes Relative: 6 %
Neutro Abs: 8.2 10*3/uL — ABNORMAL HIGH (ref 1.7–7.7)
Neutrophils Relative %: 77 %
Platelets: 598 10*3/uL — ABNORMAL HIGH (ref 150–400)
RBC: 2.45 MIL/uL — ABNORMAL LOW (ref 4.22–5.81)
RDW: 15.2 % (ref 11.5–15.5)
WBC: 10.7 10*3/uL — ABNORMAL HIGH (ref 4.0–10.5)
nRBC: 0 % (ref 0.0–0.2)

## 2021-02-14 LAB — COMPREHENSIVE METABOLIC PANEL
ALT: 30 U/L (ref 0–44)
AST: 53 U/L — ABNORMAL HIGH (ref 15–41)
Albumin: 1.8 g/dL — ABNORMAL LOW (ref 3.5–5.0)
Alkaline Phosphatase: 53 U/L (ref 38–126)
Anion gap: 7 (ref 5–15)
BUN: 7 mg/dL (ref 6–20)
CO2: 28 mmol/L (ref 22–32)
Calcium: 8.2 mg/dL — ABNORMAL LOW (ref 8.9–10.3)
Chloride: 94 mmol/L — ABNORMAL LOW (ref 98–111)
Creatinine, Ser: 0.51 mg/dL — ABNORMAL LOW (ref 0.61–1.24)
GFR, Estimated: >60 mL/min (ref >60)
Glucose, Bld: 157 mg/dL — ABNORMAL HIGH (ref 70–99)
Potassium: 4.5 mmol/L (ref 3.5–5.1)
Sodium: 129 mmol/L — ABNORMAL LOW (ref 135–145)
Total Bilirubin: 0.7 mg/dL (ref 0.3–1.2)
Total Protein: 5.2 g/dL — ABNORMAL LOW (ref 6.5–8.1)

## 2021-02-14 LAB — GLUCOSE, CAPILLARY
Glucose-Capillary: 126 mg/dL — ABNORMAL HIGH (ref 70–99)
Glucose-Capillary: 152 mg/dL — ABNORMAL HIGH (ref 70–99)
Glucose-Capillary: 211 mg/dL — ABNORMAL HIGH (ref 70–99)
Glucose-Capillary: 104 mg/dL — ABNORMAL HIGH (ref 70–99)

## 2021-02-14 LAB — AEROBIC/ANAEROBIC CULTURE W GRAM STAIN (SURGICAL/DEEP WOUND): Gram Stain: NONE SEEN

## 2021-02-14 LAB — PHOSPHORUS: Phosphorus: 4.9 mg/dL — ABNORMAL HIGH (ref 2.5–4.6)

## 2021-02-14 LAB — MAGNESIUM: Magnesium: 1.5 mg/dL — ABNORMAL LOW (ref 1.7–2.4)

## 2021-02-14 MED ORDER — MAGNESIUM SULFATE 2 GM/50ML IV SOLN
2.0000 g | Freq: Once | INTRAVENOUS | Status: AC
  Administered 2021-02-14: 2 g via INTRAVENOUS
  Filled 2021-02-14: qty 50

## 2021-02-14 NOTE — Assessment & Plan Note (Signed)
-   Considered in setting of underlying infection - Follow CBC

## 2021-02-14 NOTE — Progress Notes (Signed)
[facility] Infectious Diseases Follow Up Note  Patient Identification: Patient Name: John Ware MRN: [id number] Admit Date: 01/31/2021  7:44 AM Age: 55 y.o.Today's Date: 02/14/2021  Reason for Visit: Disseminated MSSA infection  Active Problems:   Diabetes mellitus (HCC)   Hyponatremia   Pressure injury of skin   Bacteremia due to Staphylococcus aureus   HTN (hypertension)   Anxiety   Left shoulder pain   Ascending aorta dilatation (HCC)   Vision changes   Endocarditis  Antibiotics: Cefazolin 6/8-current                    total days of antibiotics 15   Lines/Tubes: PIV's  Interval Events: Fevers, WBC is downtrending.  Hemodynamically stable.    Assessment Disseminated MSSA infection(bacteremia, TV endocarditis with pulmonary septic emboli, left shoulder septic arthritis with subcapsular/subacromial abscess+ right shoulder GH, AC joint septic arthritis, periscapular abscess with osteomyelitis  Status post OR on 6/16 and 6/18.  OR cultures 6/16 and 6/18 with MSSA CT rt and left shoulder reviewed with worsening findings in the left shoulder   Back pain - worsening back pain with vertebral tenderness on exam  Recommendations Continue cefazolin as is Following plans from surgery for need of further surgical intervention for source control given fevers and worsening CT findings Follow-up blood cultures MRI CTL spine  Following   Rest of the management continue cefazolin as per the primary team. Thank you for the consult. Please page with pertinent questions or concerns.  ______________________________________________________________________ Subjective  patient seen and examined at the bedside.  Complains of back pain, denies any nausea, vomiting or diarrhea.  Denies any new numbness weakness or tingling in the lower extremities.  Denies any bowel or bladder incontinence   Vitals BP 108/76 (BP Location: Right Arm)    Pulse (!) 107   Temp 100.1 F (37.8 C) (Oral)   Resp 16   Ht 5\' 10"  (1.778 m)   Wt 97.3 kg   SpO2 91%   BMI 30.78 kg/m     Physical Exam Constitutional: Moderate distress due to pain    Comments:   Cardiovascular:     Rate and Rhythm: Normal rate and regular rhythm.     Heart sounds:  Pulmonary:     Effort: Pulmonary effort is normal.     Comments:   Abdominal:     Palpations: Abdomen is soft.     Tenderness: Nondistended  Musculoskeletal:        General: Bilateral shoulder tenderness and restricted range of motion due to pain  Skin:    Comments: No lesions or rashes  Neurological:     General: No focal deficit present.  Thoracic and lumbar spine tenderness.  Power of b/l  lower extremities 5/5  Psychiatric:        Mood and Affect: Mood normal.     Pertinent Microbiology Results for orders placed or performed during the hospital encounter of 01/31/21  Urine culture     Status: Abnormal   Collection Time: 01/31/21  8:51 AM   Specimen: In/Out Cath Urine  Result Value Ref Range Status   Specimen Description   Final    IN/OUT CATH URINE Performed at [facility], [address] [place], [place] [address]    Special Requests   Final    NONE Performed at [facility], [address] [place], [place] [id number]    Culture >=100,000 COLONIES/mL STAPHYLOCOCCUS AUREUS (A)  Final   Report Status 02/02/2021  FINAL  Final   Organism ID, Bacteria STAPHYLOCOCCUS AUREUS (A)  Final      Susceptibility   Staphylococcus aureus - MIC*    CIPROFLOXACIN >=8 RESISTANT Resistant     GENTAMICIN <=0.5 SENSITIVE Sensitive     NITROFURANTOIN <=16 SENSITIVE Sensitive     OXACILLIN 0.5 SENSITIVE Sensitive     TETRACYCLINE <=1 SENSITIVE Sensitive     VANCOMYCIN 1 SENSITIVE Sensitive     TRIMETH/SULFA <=10 SENSITIVE Sensitive     CLINDAMYCIN <=0.25 SENSITIVE Sensitive     RIFAMPIN <=0.5 SENSITIVE Sensitive     Inducible Clindamycin  NEGATIVE Sensitive     * >=100,000 COLONIES/mL STAPHYLOCOCCUS AUREUS  Resp Panel by RT-PCR (Flu A&B, Covid) Nasopharyngeal Swab     Status: Abnormal   Collection Time: 01/31/21 10:59 AM   Specimen: Nasopharyngeal Swab; Nasopharyngeal(NP) swabs in vial transport medium  Result Value Ref Range Status   SARS Coronavirus 2 by RT PCR POSITIVE (A) NEGATIVE Final    Comment: RESULT CALLED TO, READ BACK BY AND VERIFIED WITH: GARRISON,G. RN AT 1206 01/31/21 MULLINS,T (NOTE) SARS-CoV-2 target nucleic acids are DETECTED.  The SARS-CoV-2 RNA is generally detectable in upper respiratory specimens during the acute phase of infection. Positive results are indicative of the presence of the identified virus, but do not rule out bacterial infection or co-infection with other pathogens not detected by the test. Clinical correlation with patient history and other diagnostic information is necessary to determine patient infection status. The expected result is Negative.  Fact Sheet for Patients: BloggerCourse.com  Fact Sheet for Healthcare Providers: SeriousBroker.it  This test is not yet approved or cleared by the [place] FDA and  has been authorized for detection and/or diagnosis of SARS-CoV-2 by FDA under an Emergency Use Authorization (EUA).  This EUA will remain in effect (meaning this tes t can be used) for the duration of  the COVID-19 declaration under Section 564(b)(1) of the Act, 21 U.S.C. section 360bbb-3(b)(1), unless the authorization is terminated or revoked sooner.     Influenza A by PCR NEGATIVE NEGATIVE Final   Influenza B by PCR NEGATIVE NEGATIVE Final    Comment: (NOTE) The Xpert Xpress SARS-CoV-2/FLU/RSV plus assay is intended as an aid in the diagnosis of influenza from Nasopharyngeal swab specimens and should not be used as a sole basis for treatment. Nasal washings and aspirates are unacceptable for Xpert Xpress  SARS-CoV-2/FLU/RSV testing.  Fact Sheet for Patients: BloggerCourse.com  Fact Sheet for Healthcare Providers: SeriousBroker.it  This test is not yet approved or cleared by the [place] FDA and has been authorized for detection and/or diagnosis of SARS-CoV-2 by FDA under an Emergency Use Authorization (EUA). This EUA will remain in effect (meaning this test can be used) for the duration of the COVID-19 declaration under Section 564(b)(1) of the Act, 21 U.S.C. section 360bbb-3(b)(1), unless the authorization is terminated or revoked.  Performed at [facility], [address], [place], [place] [address]   Blood Culture (routine x 2)     Status: Abnormal   Collection Time: 01/31/21 10:59 AM   Specimen: BLOOD  Result Value Ref Range Status   Specimen Description   Final    BLOOD LEFT ANTECUBITAL Performed at [facility], [address] [place], [place] [address]    Special Requests   Final    BOTTLES DRAWN AEROBIC AND ANAEROBIC Blood Culture adequate volume Performed at [facility], [address], [place], [place] [address]    Culture  Setup Time   Final    GRAM POSITIVE COCCI IN CLUSTERS IN BOTH AEROBIC AND ANAEROBIC BOTTLES CRITICAL RESULT CALLED TO, READ BACK BY AND VERIFIED WITH: PHARMD MICHELLE L. [phone] FCP Performed at [facility], [address] [place], [place] [address]    Culture STAPHYLOCOCCUS AUREUS (A)  Final   Report Status 02/04/2021 FINAL  Final   Organism ID, Bacteria STAPHYLOCOCCUS AUREUS  Final      Susceptibility   Staphylococcus aureus - MIC*    CIPROFLOXACIN >=8 RESISTANT Resistant     ERYTHROMYCIN >=8 RESISTANT Resistant     GENTAMICIN <=0.5 SENSITIVE Sensitive     OXACILLIN 0.5 SENSITIVE Sensitive     TETRACYCLINE <=1 SENSITIVE Sensitive     VANCOMYCIN <=0.5 SENSITIVE Sensitive     TRIMETH/SULFA <=10 SENSITIVE Sensitive      CLINDAMYCIN <=0.25 SENSITIVE Sensitive     RIFAMPIN <=0.5 SENSITIVE Sensitive     Inducible Clindamycin NEGATIVE Sensitive     * STAPHYLOCOCCUS AUREUS  Blood Culture ID Panel (Reflexed)     Status: Abnormal   Collection Time: 01/31/21 10:59 AM  Result Value Ref Range Status   Enterococcus faecalis NOT DETECTED NOT DETECTED Final   Enterococcus Faecium NOT DETECTED NOT DETECTED Final   Listeria monocytogenes NOT DETECTED NOT DETECTED Final   Staphylococcus species DETECTED (A) NOT DETECTED Final    Comment: CRITICAL RESULT CALLED TO, READ BACK BY AND VERIFIED WITH: PHARMD MICHELLE L. [phone] FCP    Staphylococcus aureus (BCID) DETECTED (A) NOT DETECTED Final    Comment: CRITICAL RESULT CALLED TO, READ BACK BY AND VERIFIED WITH: PHARMD MICHELLE L. [phone] FCP    Staphylococcus epidermidis NOT DETECTED NOT DETECTED Final   Staphylococcus lugdunensis NOT DETECTED NOT DETECTED Final   Streptococcus species NOT DETECTED NOT DETECTED Final   Streptococcus agalactiae NOT DETECTED NOT DETECTED Final   Streptococcus pneumoniae NOT DETECTED NOT DETECTED Final   Streptococcus pyogenes NOT DETECTED NOT DETECTED Final   A.calcoaceticus-baumannii NOT DETECTED NOT DETECTED Final   Bacteroides fragilis NOT DETECTED NOT DETECTED Final   Enterobacterales NOT DETECTED NOT DETECTED Final   Enterobacter cloacae complex NOT DETECTED NOT DETECTED Final   Escherichia coli NOT DETECTED NOT DETECTED Final   Klebsiella aerogenes NOT DETECTED NOT DETECTED Final   Klebsiella oxytoca NOT DETECTED NOT DETECTED Final   Klebsiella pneumoniae NOT DETECTED NOT DETECTED Final   Proteus species NOT DETECTED NOT DETECTED Final   Salmonella species NOT DETECTED NOT DETECTED Final   Serratia marcescens NOT DETECTED NOT DETECTED Final   Haemophilus influenzae NOT DETECTED NOT DETECTED Final   Neisseria meningitidis NOT DETECTED NOT DETECTED Final   Pseudomonas aeruginosa NOT DETECTED NOT DETECTED Final    Stenotrophomonas maltophilia NOT DETECTED NOT DETECTED Final   Candida albicans NOT DETECTED NOT DETECTED Final   Candida auris NOT DETECTED NOT DETECTED Final   Candida glabrata NOT DETECTED NOT DETECTED Final   Candida krusei NOT DETECTED NOT DETECTED Final   Candida parapsilosis NOT DETECTED NOT DETECTED Final   Candida tropicalis NOT DETECTED NOT DETECTED Final   Cryptococcus neoformans/gattii NOT DETECTED NOT DETECTED Final   Meth resistant mecA/C and MREJ NOT DETECTED NOT DETECTED Final    Comment: Performed at [facility], [address], [place], [place] [address]  Blood Culture (routine x 2)     Status: Abnormal   Collection Time: 01/31/21 11:15 AM   Specimen: BLOOD  Result Value Ref Range Status   Specimen Description  Final    BLOOD RIGHT ANTECUBITAL Performed at [facility], [address], [place], [place] [address]    Special Requests   Final    BOTTLES DRAWN AEROBIC AND ANAEROBIC Blood Culture adequate volume Performed at [facility], [address] [place], [place] [address]    Culture  Setup Time   Final    GRAM POSITIVE COCCI IN CLUSTERS IN BOTH AEROBIC AND ANAEROBIC BOTTLES CRITICAL VALUE NOTED.  VALUE IS CONSISTENT WITH PREVIOUSLY REPORTED AND CALLED VALUE.    Culture (A)  Final    STAPHYLOCOCCUS AUREUS SUSCEPTIBILITIES PERFORMED ON PREVIOUS CULTURE WITHIN THE LAST 5 DAYS. Performed at [facility], [address], [place], [place] [address]    Report Status 02/03/2021 FINAL  Final  MRSA PCR Screening     Status: None   Collection Time: 01/31/21  2:28 PM   Specimen: Nasal Mucosa; Nasopharyngeal  Result Value Ref Range Status   MRSA by PCR NEGATIVE NEGATIVE Final    Comment:        The GeneXpert MRSA Assay (FDA approved for NASAL specimens only), is one component of a comprehensive MRSA colonization surveillance program. It is not intended to diagnose MRSA infection nor to guide or monitor  treatment for MRSA infections. Performed at [facility], [address] [place], [place] [address]   Culture, blood (routine x 2)     Status: None   Collection Time: 02/01/21  2:20 PM   Specimen: BLOOD  Result Value Ref Range Status   Specimen Description   Final    BLOOD RIGHT ANTECUBITAL Performed at [facility], [address] [place], [place] [address]    Special Requests   Final    BOTTLES DRAWN AEROBIC AND ANAEROBIC Blood Culture adequate volume Performed at [facility], [address], [place], [place] [address]    Culture   Final    NO GROWTH 5 DAYS Performed at [facility], [address] [place], [place] [address]    Report Status 02/06/2021 FINAL  Final  Culture, blood (routine x 2)     Status: None   Collection Time: 02/01/21  2:20 PM   Specimen: BLOOD RIGHT HAND  Result Value Ref Range Status   Specimen Description   Final    BLOOD RIGHT HAND Performed at [facility], [address], [place], [place] [address]    Special Requests   Final    BOTTLES DRAWN AEROBIC AND ANAEROBIC Blood Culture adequate volume Performed at [facility], [address] [place], [place] [address]    Culture   Final    NO GROWTH 5 DAYS Performed at [facility], [address], [place], [place] [address]    Report Status 02/06/2021 FINAL  Final  Aerobic/Anaerobic Culture w Gram Stain (surgical/deep wound)     Status: None   Collection Time: 02/02/21  4:12 PM   Specimen: Abscess  Result Value Ref Range Status   Specimen Description   Final    ABSCESS SUBSCAPULAR Performed at [facility], [address] [place], [place] [address]    Special Requests   Final    NONE Performed at [facility], [address] [place], [place] [address]    Gram Stain   Final    ABUNDANT WBC PRESENT, PREDOMINANTLY PMN ABUNDANT GRAM POSITIVE COCCI  IN CLUSTERS IN TETRADS    Culture  Final    ABUNDANT STAPHYLOCOCCUS AUREUS NO ANAEROBES ISOLATED Performed at [facility], [address] [place], [place] [address]    Report Status 02/07/2021 FINAL  Final   Organism ID, Bacteria STAPHYLOCOCCUS AUREUS  Final      Susceptibility   Staphylococcus aureus - MIC*    CIPROFLOXACIN >=8 RESISTANT Resistant     ERYTHROMYCIN >=8 RESISTANT Resistant     GENTAMICIN <=0.5 SENSITIVE Sensitive     OXACILLIN 0.5 SENSITIVE Sensitive     TETRACYCLINE <=1 SENSITIVE Sensitive     VANCOMYCIN <=0.5 SENSITIVE Sensitive     TRIMETH/SULFA <=10 SENSITIVE Sensitive     CLINDAMYCIN <=0.25 SENSITIVE Sensitive     RIFAMPIN <=0.5 SENSITIVE Sensitive     Inducible Clindamycin NEGATIVE Sensitive     * ABUNDANT STAPHYLOCOCCUS AUREUS  Fungus Culture With Stain     Status: None (Preliminary result)   Collection Time: 02/08/21  6:48 PM   Specimen: Synovial, Left Shoulder; Body Fluid  Result Value Ref Range Status   Fungus Stain Final report  Final    Comment: (NOTE) Performed At: [facility] [address] [place], [place] [id number] Jolene Schimke MD [id number]:[id number]    Fungus (Mycology) Culture PENDING  Incomplete   Fungal Source SHOULDER  Final    Comment: LEFT Performed at [facility], [address] [place], [place] [address]   Aerobic/Anaerobic Culture w Gram Stain (surgical/deep wound)     Status: None (Preliminary result)   Collection Time: 02/08/21  6:48 PM   Specimen: Synovial, Left Shoulder; Body Fluid  Result Value Ref Range Status   Specimen Description   Final    SHOULDER LEFT Performed at [facility], [address] [place], [place] [address]    Special Requests   Final    NONE Performed at [facility], [address] [place], [place] [address]    Gram Stain   Final    ABUNDANT WBC PRESENT,BOTH PMN AND MONONUCLEAR RARE GRAM POSITIVE COCCI IN PAIRS Performed at  [facility], [address], [place], [place] [address]    Culture   Final    RARE STAPHYLOCOCCUS AUREUS NO ANAEROBES ISOLATED; CULTURE IN PROGRESS FOR 5 DAYS    Report Status PENDING  Incomplete   Organism ID, Bacteria STAPHYLOCOCCUS AUREUS  Final      Susceptibility   Staphylococcus aureus - MIC*    CIPROFLOXACIN >=8 RESISTANT Resistant     ERYTHROMYCIN >=8 RESISTANT Resistant     GENTAMICIN <=0.5 SENSITIVE Sensitive     OXACILLIN 0.5 SENSITIVE Sensitive     TETRACYCLINE <=1 SENSITIVE Sensitive     VANCOMYCIN 1 SENSITIVE Sensitive     TRIMETH/SULFA <=10 SENSITIVE Sensitive     CLINDAMYCIN <=0.25 SENSITIVE Sensitive     RIFAMPIN <=0.5 SENSITIVE Sensitive     Inducible Clindamycin NEGATIVE Sensitive     * RARE STAPHYLOCOCCUS AUREUS  Fungus Culture Result     Status: None   Collection Time: 02/08/21  6:48 PM  Result Value Ref Range Status   Result 1 Comment  Final    Comment: (NOTE) KOH/Calcofluor preparation:  no fungus observed. Performed At: [facility] [address] [place], [place] [id number] Jolene Schimke MD [id number]:[id number]   Fungus Culture With Stain     Status: None (Preliminary result)   Collection Time: 02/08/21  6:52 PM   Specimen: Joint, Other; Body Fluid  Result Value Ref Range Status   Fungus Stain Final report  Final    Comment: (NOTE) Performed At: [facility] [address] [place], [place] [id number] Jolene Schimke MD [id number]:[id number]    Fungus (Mycology) Culture PENDING  Incomplete   Fungal Source SHOULDER  Final    Comment: LEFT BONE CHIPS Performed at [facility], [address] [place], [place] [address]   Aerobic/Anaerobic Culture w Gram Stain (surgical/deep wound)     Status: None (Preliminary result)   Collection Time: 02/08/21  6:52 PM   Specimen: Joint, Other; Body Fluid  Result Value Ref Range Status   Specimen Description   Final    SHOULDER LEFT BONE CHIPS Performed at [facility], [address], [place], [place] [address]    Special Requests   Final    NONE Performed at [facility], [address] [place], [place] [address]    Gram Stain   Final    NO WBC SEEN NO ORGANISMS SEEN Performed at [facility], [address] [place], [place] [address]    Culture   Final    RARE STAPHYLOCOCCUS AUREUS NO ANAEROBES ISOLATED; CULTURE IN PROGRESS FOR 5 DAYS    Report Status PENDING  Incomplete   Organism ID, Bacteria STAPHYLOCOCCUS AUREUS  Final      Susceptibility   Staphylococcus aureus - MIC*    CIPROFLOXACIN >=8 RESISTANT Resistant     ERYTHROMYCIN >=8 RESISTANT Resistant     GENTAMICIN <=0.5 SENSITIVE Sensitive     OXACILLIN 0.5 SENSITIVE Sensitive     TETRACYCLINE <=1 SENSITIVE Sensitive     VANCOMYCIN 1 SENSITIVE Sensitive     TRIMETH/SULFA <=10 SENSITIVE Sensitive     CLINDAMYCIN <=0.25 SENSITIVE Sensitive     RIFAMPIN <=0.5 SENSITIVE Sensitive     Inducible Clindamycin NEGATIVE Sensitive     * RARE STAPHYLOCOCCUS AUREUS  Fungus Culture Result     Status: None   Collection Time: 02/08/21  6:52 PM  Result Value Ref Range Status   Result 1 Comment  Final    Comment: (NOTE) KOH/Calcofluor preparation:  no fungus observed. Performed At: [facility] [address] [place], [place] [id number] Jolene Schimke MD IH:[id number]   Aerobic/Anaerobic Culture w Gram Stain (surgical/deep wound)     Status: None (Preliminary result)   Collection Time: 02/10/21 10:22 AM   Specimen: Wound  Result Value Ref Range Status   Specimen Description   Final    WOUND CHEST Performed at [facility], [address], [place], [place] [address]    Special Requests   Final    NONE Performed at [facility], [address] [place] [address] [place], [place] [address]    Gram Stain   Final    ABUNDANT WBC PRESENT,BOTH PMN AND MONONUCLEAR MODERATE GRAM POSITIVE COCCI Performed at [facility],  [address], [place], [place] [address]    Culture   Final    ABUNDANT STAPHYLOCOCCUS AUREUS NO ANAEROBES ISOLATED; CULTURE IN PROGRESS FOR 5 DAYS    Report Status PENDING  Incomplete   Organism ID, Bacteria STAPHYLOCOCCUS AUREUS  Final      Susceptibility   Staphylococcus aureus - MIC*    CIPROFLOXACIN >=8 RESISTANT Resistant     ERYTHROMYCIN >=8 RESISTANT Resistant     GENTAMICIN <=0.5 SENSITIVE Sensitive     OXACILLIN 0.5 SENSITIVE Sensitive     TETRACYCLINE <=1 SENSITIVE Sensitive     VANCOMYCIN 1 SENSITIVE Sensitive     TRIMETH/SULFA <=10 SENSITIVE Sensitive  CLINDAMYCIN <=0.25 SENSITIVE Sensitive     RIFAMPIN <=0.5 SENSITIVE Sensitive     Inducible Clindamycin NEGATIVE Sensitive     * ABUNDANT STAPHYLOCOCCUS AUREUS  Culture, blood (single)     Status: None (Preliminary result)   Collection Time: 02/13/21  5:51 AM   Specimen: BLOOD  Result Value Ref Range Status   Specimen Description   Final    BLOOD RIGHT ANTECUBITAL Performed at [facility], [address], [place], [place] [address]    Special Requests   Final    BOTTLES DRAWN AEROBIC ONLY Blood Culture adequate volume Performed at [facility], [address] [place], [place] [address]    Culture   Final    NO GROWTH < 24 HOURS Performed at [facility], [address] [place], [place] [address]    Report Status PENDING  Incomplete      Pertinent Lab. CBC Latest Ref Rng & Units 02/14/2021 02/13/2021 02/12/2021  WBC 4.0 - 10.5 K/uL 10.7(H) 12.1(H) 15.5(H)  Hemoglobin 13.0 - 17.0 g/dL 7.6(L) 7.4(L) 8.8(L)  Hematocrit 39.0 - 52.0 % 23.2(L) 23.2(L) 26.9(L)  Platelets 150 - 400 K/uL 598(H) 486(H) 544(H)   CMP Latest Ref Rng & Units 02/14/2021 02/13/2021 02/12/2021  Glucose 70 - 99 mg/dL 220(U) 542(H) 062(B)  BUN 6 - 20 mg/dL 7 7 7   Creatinine 0.61 - 1.24 mg/dL ) 7.62(G) 3.15(V)  Sodium 135 - 145 mmol/L 129(L) 129(L) 132(L)  Potassium 3.5 - 5.1 mmol/L 4.5 4.6  4.4  Chloride 98 - 111 mmol/L 94(L) 94(L) 95(L)  CO2 22 - 32 mmol/L 28 28 30   Calcium 8.9 - 10.3 mg/dL 8.2(L) 8.3(L) 8.2(L)  Total Protein 6.5 - 8.1 g/dL 5.2(L) 5.2(L) 5.5(L)  Total Bilirubin 0.3 - 1.2 mg/dL 0.7 0.7 0.9  Alkaline Phos 38 - 126 U/L 53 60 61  AST 15 - 41 U/L 53(H) 49(H) 29  ALT 0 - 44 U/L 30 26 17     Pertinent Imaging today Plain films and CT images have been personally visualized and interpreted; radiology reports have been reviewed. Decision making incorporated into the Impression / Recommendations.  CT chest/RT shoulder and Left shoulder   IMPRESSION: Postsurgical changes at the right shoulder including resection at the [facility] joint. Soft tissue gas and fluid anteriorly, likely related to recent surgery. Small glenohumeral joint effusion with mild distention of the subacromial-subdeltoid bursa. There is soft tissue edema without no well-defined soft tissue abscess along the right shoulder.   No new osseous changes to suggest osteomyelitis, though note CT is insensitive in detecting osteomyelitis.  CT left shoulder 02/14/21 IMPRESSION: Persistent, extensive abscess along the left chest wall/axilla, with visible gas/fluid extending superiorly in the neck to the level of C6/C7, and posteriorly to the paraspinal musculature. Most well-defined collection is within the axilla measuring 4.0 x 1.9 cm in the transverse plane and 7.5 cm in craniocaudal extent.   Gas containing left shoulder joint effusion and distension of the subacromial-subdeltoid bursa, concerning for septic arthritis/bursitis, though these findings could in part be related to recent surgery.   No new osseous abnormality to suggest osteomyelitis, though note CT is insensitive in detecting osteomyelitis.  I have spent more than 35 minutes for this patient encounter including review of prior medical records, coordination of care  with greater than 50% of time being face to face/counseling and discussing  diagnostics/treatment plan with the patient/family.  Electronically signed by:   , MD Infectious Disease Physician [facility]  for Infectious Disease Pager: [phone]

## 2021-02-14 NOTE — Progress Notes (Signed)
Appreciate input from neurosurgery, I called them to give some degree of guidance given that the infection appears to be tracking along the paraspinal musculature up towards the neck.  He still has a fairly significant fluid collection that looks like it is around the chest wall in the axilla, just adjacent to the neurovascular structures and brachial plexus.  This is outside of my zone of surgical expertise, and after discussion with general surgery, it would be reasonable to make the next step in evaluation by interventional radiology to determine if they feel that they can drain the fluid collection in that location.  The remaining areas of both shoulders look like they are currently adequately decompressed, the to the best that I can tell from the CT evaluations.  We may need to repeat imaging down the line, the small pockets of air in those locations I do not believe represent necrotizing fasciitis, but rather simply postsurgical changes, however there is still fluid that needs to be decompressed around the left axilla.  We will try and coordinate with interventional radiology accordingly.  Right now I am not planning further surgical intervention from an orthopedic standpoint, unless the imaging and subsequent days demonstrate recurrent fluid collection accumulations around either of the glenohumeral joints.  [name], MD

## 2021-02-14 NOTE — Assessment & Plan Note (Signed)
-  Obtained X-Ray and showed "Medial joint space narrowing is noted with mild osteophytic change. Mild patellofemoral changes are noted as well. No joint effusion is seen. No acute fracture is noted."

## 2021-02-14 NOTE — Progress Notes (Signed)
RN medicated patient per request immediatly after receiving report. RN informed patient when the next pain medication is due and made arrangements with patient. Patient seemed very agitated post conversation. Patient continues to be    impulsive, yelling and insulting RN. RN requested to change patient.

## 2021-02-14 NOTE — Progress Notes (Signed)
4 Days Post-Op  Subjective: CC: He complains of back and b/l shoulder pain. No chest pain presently. Vac in place with purulent drainage in cannister. Tmax 103.1 overnight.   Objective: Vital signs in last 24 hours: Temp:  [98.9 F (37.2 C)-103.1 F (39.5 C)] 99.6 F (37.6 C) (06/22 0759) Pulse Rate:  [84-107] 100 (06/22 0759) Resp:  [15-22] 18 (06/22 0759) BP: (108-147)/(69-91) 126/76 (06/22 0759) SpO2:  [94 %-100 %] 97 % (06/22 0759) Last BM Date: 02/13/21  Intake/Output from previous day: 06/21 0701 - 06/22 0700 In: 100 [IV Piggyback:100] Out: 2200 [Urine:2200] Intake/Output this shift: Total I/O In: -  Out: 400 [Urine:400]  PE: Gen:  Alert, NAD, pleasant Pulm:  rate and effort normal Skin: Orthopedic wounds dressed. No rashes noted, warm and dry L Chest Wall: Vac in place. Periwound appears well without cellulitis. Purulent drainage in cannister  Lab Results:  Recent Labs    02/13/21 0551 02/14/21 0212  WBC 12.1* 10.7*  HGB 7.4* 7.6*  HCT 23.2* 23.2*  PLT 486* 598*   BMET Recent Labs    02/13/21 0551 02/14/21 0212  NA 129* 129*  K 4.6 4.5  CL 94* 94*  CO2 28 28  GLUCOSE 181* 157*  BUN 7 7  CREATININE 0.38* 0.51*  CALCIUM 8.3* 8.2*   PT/INR No results for input(s): LABPROT, INR in the last 72 hours. CMP     Component Value Date/Time   NA 129 (L) 02/14/2021 0212   NA 137 10/09/2020 1155   K 4.5 02/14/2021 0212   CL 94 (L) 02/14/2021 0212   CO2 28 02/14/2021 0212   GLUCOSE 157 (H) 02/14/2021 0212   BUN 7 02/14/2021 0212   BUN 11 10/09/2020 1155   CREATININE 0.51 (L) 02/14/2021 0212   CALCIUM 8.2 (L) 02/14/2021 0212   PROT 5.2 (L) 02/14/2021 0212   PROT 7.6 10/09/2020 1155   ALBUMIN 1.8 (L) 02/14/2021 0212   ALBUMIN 4.7 10/09/2020 1155   AST 53 (H) 02/14/2021 0212   ALT 30 02/14/2021 0212   ALKPHOS 53 02/14/2021 0212   BILITOT 0.7 02/14/2021 0212   BILITOT 0.8 10/09/2020 1155   GFRNONAA >60 02/14/2021 0212   GFRAA 113 10/09/2020  1155   Lipase  No results found for: LIPASE     Studies/Results: CT CHEST WO CONTRAST  Result Date: 02/13/2021 CLINICAL DATA:  History of shoulder infection. EXAM: CT CHEST WITHOUT CONTRAST TECHNIQUE: Multidetector CT imaging of the chest was performed following the standard protocol without IV contrast. COMPARISON:  CT chest 02/09/2021 FINDINGS: Cardiovascular: The heart size is normal. No substantial pericardial effusion. No thoracic aortic aneurysm. Mediastinum/Nodes: No mediastinal lymphadenopathy. No evidence for gross hilar lymphadenopathy although assessment is limited by the lack of intravenous contrast on today's study. The esophagus has normal imaging features. Bulky lymphadenopathy is again identified in the left axilla. Lungs/Pleura: 7 mm subpleural left upper lobe pulmonary nodule on 71/7 is similar to prior. 6 mm nodule in the medial left upper lobe (91/7) and 12 mm nodule in the posterior left upper lobe are similar to prior. Bibasilar collapse/consolidation is not substantially changed in the interval. Probable tiny bilateral effusions. Upper Abdomen: Unremarkable. Musculoskeletal: Soft tissue gas and fluid is identified in the left supraclavicular region, around the left shoulder, and in the lateral and posterior left chest wall. Fluid and gas in the supraclavicular region tracking posteriorly into the subscapular space is similar to prior. Diffuse edema noted in the muscles of the rotator cuff. Surgical drain  noted posterolateral lower left thoracic wall with interval decrease in soft tissue gas and swelling in this region. There is an open wound in the region of the cervical drain. Gas and fluid are seen in the region of the right shoulder joint, with more gas visible on today's study than on the previous exam. Diffuse edema noted in the muscles of the rotator cuff. IMPRESSION: 1. Interval drain placement in the lateral and posterolateral left chest wall with decrease in the volume of  gas and debris in the chest wall. 2. Similar gas in fluid in the left supraclavicular space and region of the left shoulder joint with diffuse edema of the rotator cuff muscles. 3. Gas is now visible in the soft tissues anterior to the right shoulder with similar appearance of fluid and edema in the soft tissues and muscles around the right shoulder. Electronically Signed   By: Kennith Center M.D.   On: 02/13/2021 17:37   CT SHOULDER LEFT WO CONTRAST  Result Date: 02/14/2021 CLINICAL DATA:  Septic arthritis suspected, shoulder, xray done septic arthritis left shoulder EXAM: CT OF THE UPPER LEFT EXTREMITY WITHOUT CONTRAST TECHNIQUE: Multidetector CT imaging of the upper left extremity was performed according to the standard protocol. COMPARISON:  CT 02/09/2021 FINDINGS: Bones/Joint/Cartilage There is no evidence of acute fracture. There is no new osseous erosion, destruction, or periosteal reaction. Persisting gas containing glenohumeral joint effusion and distention of the subacromial-subdeltoid bursa. Some of this gas/fluid is collecting in the subcoracoid recess. Ligaments Suboptimally assessed by CT. Muscles and Tendons There is persisting gas and fluid seen along the left upper chest wall/axilla, with most superior extent extending up the neck to the level of C6-C7 (axial image 3). There is persistent foci of intercostal gas (axial image 102/105). There is paraspinal muscle gas is well (axial image 82 and 51). Discrete abscesses are difficult to measure, with most well-defined area measuring 4.0 x 1.9 cm in the transverse plane by 7.5 cm in craniocaudal extent. Soft tissues As above. Left axillary lymphadenopathy. Small left pleural effusion. There is surgical material, likely packing along the left posterolateral soft tissues on axial image 112). IMPRESSION: Persistent, extensive abscess along the left chest wall/axilla, with visible gas/fluid extending superiorly in the neck to the level of C6/C7, and  posteriorly to the paraspinal musculature. Most well-defined collection is within the axilla measuring 4.0 x 1.9 cm in the transverse plane and 7.5 cm in craniocaudal extent. Gas containing left shoulder joint effusion and distension of the subacromial-subdeltoid bursa, concerning for septic arthritis/bursitis, though these findings could in part be related to recent surgery. No new osseous abnormality to suggest osteomyelitis, though note CT is insensitive in detecting osteomyelitis. Electronically Signed   By: Caprice Renshaw   On: 02/14/2021 07:34   CT SHOULDER RIGHT WO CONTRAST  Result Date: 02/14/2021 CLINICAL DATA:  Septic arthritis suspected, shoulder, xray done septic arthritis right shoulder EXAM: CT OF THE UPPER RIGHT EXTREMITY WITHOUT CONTRAST TECHNIQUE: Multidetector CT imaging of the upper right extremity was performed according to the standard protocol. COMPARISON:  Right shoulder MRI 02/07/2021, CT 02/01/2021 FINDINGS: Bones/Joint/Cartilage Postsurgical changes of the right shoulder, including AC joint resection. There is soft tissue gas in fluid anteriorly, likely surgical related. There a small glenohumeral joint effusion and small amount of fluid in the subacromial-subdeltoid bursa. Ligaments Suboptimally assessed by CT. Muscles and Tendons Findings of full-thickness cuff tear. Soft tissues There is soft tissue swelling along the shoulder. In subcutaneous edema extending down the arm. IMPRESSION: Postsurgical  changes at the right shoulder including resection at the [facility] joint. Soft tissue gas and fluid anteriorly, likely related to recent surgery. Small glenohumeral joint effusion with mild distention of the subacromial-subdeltoid bursa. There is soft tissue edema without no well-defined soft tissue abscess along the right shoulder. No new osseous changes to suggest osteomyelitis, though note CT is insensitive in detecting osteomyelitis. Electronically Signed   By: Caprice Renshaw   On: 02/14/2021 07:44    DG CHEST PORT 1 VIEW  Result Date: 02/13/2021 CLINICAL DATA:  Respiratory compromise EXAM: PORTABLE CHEST 1 VIEW COMPARISON:  02/10/2021 FINDINGS: Low lung volumes. Hazy atelectasis at the bases. Small left pleural effusion. Stable cardiomediastinal silhouette. No pneumothorax. IMPRESSION: Low lung volumes with small left effusion and hazy atelectasis at the bases Electronically Signed   By: Jasmine Pang M.D.   On: 02/13/2021 23:28    Anti-infectives: Anti-infectives (From admission, onward)    Start     Dose/Rate Route Frequency Ordered Stop   02/10/21 0727  ceFAZolin (ANCEF) 2-4 GM/100ML-% IVPB       Note to Pharmacy: Lyda Kalata   : cabinet override      02/10/21 0727 02/10/21 1856   02/01/21 0600  ceFAZolin (ANCEF) IVPB 2g/100 mL premix        2 g 200 mL/hr over 30 Minutes Intravenous Every 8 hours 02/01/21 0414     01/31/21 1800  cefTRIAXone (ROCEPHIN) 1 g in sodium chloride 0.9 % 100 mL IVPB  Status:  Discontinued        1 g 200 mL/hr over 30 Minutes Intravenous Every 24 hours 01/31/21 1453 02/01/21 0425   01/31/21 1115  vancomycin (VANCOREADY) IVPB 2000 mg/400 mL        2,000 mg 200 mL/hr over 120 Minutes Intravenous  Once 01/31/21 1105 01/31/21 1320   01/31/21 1045  ceFEPIme (MAXIPIME) 2 g in sodium chloride 0.9 % 100 mL IVPB        2 g 200 mL/hr over 30 Minutes Intravenous  Once 01/31/21 1035 01/31/21 1129   01/31/21 1045  metroNIDAZOLE (FLAGYL) IVPB 500 mg        500 mg 100 mL/hr over 60 Minutes Intravenous  Once 01/31/21 1035 01/31/21 1208   01/31/21 1045  vancomycin (VANCOCIN) IVPB 1000 mg/200 mL premix  Status:  Discontinued        1,000 mg 200 mL/hr over 60 Minutes Intravenous  Once 01/31/21 1035 01/31/21 1105        Assessment/Plan Complex left lateral chest wall and axillary abscess POD#4 S/p Incision and drainage of multiple left lateral chest wall abscesses along with excisional debridement of left lateral chest wall skin, soft tissue, muscle, and fascia  and axilla 6/18 Dr. Andrey Campanile - Cx with abundant staphylococcus aureus - ID following and managing antibiotics - CT chest yesterday with decrease in the volume of gas and debris in the chest wall. CT shoulder on L show a persistent, extensive abscess along the left chest wall/axilla, with visible gas/fluid extending superiorly in the neck to the level of C6/C7, and posteriorly to the paraspinal musculature. Axilla fluid collection measures 4.0 x 1.9 x 7.5cm. Ortho reviewed with NSGY who recommended IR aspiration. Agree with this plan - We do not recommend any further debridement at this time - Cont Vac. Plan to change on Friday with WOCN - We will follow with you    Bilateral shoulder abscess, s/p debridement 6/16 and 6/18 - Per Dr. Dion Saucier  - As above  ID - currently ancef  6/9>> Tmax 103.1. WBC down from 15.5 > 12.1 >10.7. FEN - regular diet VTE - lovenox Foley - none   Sepsis on admission, MSSA bacteremia Endocarditis HTN DM Chronic back pain   LOS: 14 days    [name] , [facility] Surgery 02/14/2021, 9:13 AM Please see Amion for pager number during day hours 7:00am-4:30pm

## 2021-02-14 NOTE — Progress Notes (Signed)
Patient ID: John Ware, male   DOB: 1966-04-24, 55 y.o.   MRN: [id number] CT chest and MRI thoracic reviewed... Abscess involvement noted throughout left posterior chest wall axilla scapula rhomboids and extension up to the lateral paraspinals.  See no involvement of the cervical/thoracic spine therefore no neurosurgical intervention needed.  Recommend continued maximal medical therapy and interventional aspiration of any focal abscess or fluid cavities per orthopedics.

## 2021-02-14 NOTE — Progress Notes (Signed)
Physical Therapy Treatment Patient Details Name: John Ware MRN: [id number] DOB: [birth year] Today's Date: 02/14/2021    History of Present Illness Patient is a 55 y.o. male presented to ED with weakness and per wife generally unwell over the past 3 weeks. He had a positive home COVID test on 01/12/21. He had no respiratory symptoms at the time. He just had fevers that were responsive to APAP. He self isolated at home until 01/20/21 when he was at a party and experienced a fall. bil shoulder and spine xrays negative.  MRI brain=Punctate acute or subacute infarct versus artifact in the high left  frontal lobe. Otherwise, no evidence of recent infarction.  Underwent bilateral MRIs of his shoulders and he is still having fevers.  At the left shoulder was concerned for septic arthritis and left axillary abscess.  Orthopedics was reconsulted as well as IR.  Pt s/p  Left shoulder excisional debridement, deep abscess, with arthrotomy and irrigation and debridement of the joint with biceps tenotomy and excision of necrotic rotator cuff on 02/08/21. Patient s/p Right shoulder excisional debridement, including biceps tenolysis, Evacuation of purulent abscess, right subacromial space, as well as subcoracoid bursa, Right shoulder arthrotomy with irrigation and debridement of joint, Right shoulder open distal clavicle resection on 6/18.  Pt also s/p Incision and drainage of multiple left lateral chest wall abscesses along with excisional debridement on 6/18. PMH: significant of chronic pain, HTN, DM2, depression, spinal stenosis.    PT Comments    Pt continues to require more assistance with bed mobility and transfers, especially sit to/from stand, requiring +2 assistance, due to continued B shoulder pain and limited B shoulder ROM s/p B shoulder I&D's. He continues to enjoy ambulation with rolling walker, although verbal cues needed throughout to keep body close to walker and extend trunk. Continue to recommend CIR  upon d/c.    Follow Up Recommendations  CIR     Equipment Recommendations  Rolling walker with 5" wheels    Recommendations for Other Services Rehab consult     Precautions / Restrictions Precautions Precautions: Fall Precaution Comments: B UE slings for comfort Required Braces or Orthoses: Sling Restrictions Weight Bearing Restrictions: No Other Position/Activity Restrictions: per orders 6/18 patient is WBAT B UEs    Mobility  Bed Mobility Overal bed mobility: Needs Assistance Bed Mobility: Sit to Supine     Supine to sit: Mod assist;HOB elevated Sit to supine: Mod assist;+2 for physical assistance;HOB elevated   General bed mobility comments: mod A for trunk support due to limited shoulder ROM and pain    Transfers Overall transfer level: Needs assistance Equipment used: Rolling walker (2 wheeled) Transfers: Sit to/from Stand Sit to Stand: Min assist;+2 safety/equipment;From elevated surface Stand pivot transfers: Min assist;+2 safety/equipment;+2 physical assistance       General transfer comment: cues to use momentum to stand, min A x2 for line management/safety  Ambulation/Gait Ambulation/Gait assistance: Min guard Gait Distance (Feet): 400 Feet Assistive device: Rolling walker (2 wheeled) Gait Pattern/deviations: Step-through pattern;Decreased stride length;Trunk flexed Gait velocity: decr   General Gait Details: cues for posture as pt continues to keep RW too far forward however, distance per pt preference, HR up to 144 bpm, standing rest break after 200 feet for breathing   Stairs             Wheelchair Mobility    Modified Rankin (Stroke Patients Only)       Balance Overall balance assessment: Needs assistance Sitting-balance support: Feet supported Sitting balance-Leahy  Scale: Fair     Standing balance support: Single extremity supported;No upper extremity supported Standing balance-[name]: Fair Standing balance comment:  intermittent use of unilateral upper extremity support at sink side                            Cognition Arousal/Alertness: Awake/alert Behavior During Therapy: WFL for tasks assessed/performed Overall Cognitive Status: Within Functional Limits for tasks assessed Area of Impairment: Problem solving                   Current Attention Level: Sustained Memory: Decreased recall of precautions;Decreased short-term memory Following Commands: Follows one step commands with increased time;Follows multi-step commands inconsistently   Awareness: Emergent Problem Solving: Slow processing;Requires verbal cues;Difficulty sequencing General Comments: still becomes distracted and heavily perseverates on pain      Exercises Other Exercises Other Exercises: Attempted to have patient engage in shoulder ROM however states he needs more pain medication "or I'm not doing it, they need to heal more."    General Comments        Pertinent Vitals/Pain Pain Assessment: Faces Pain Score: 7  Faces Pain Scale: Hurts even more Pain Location: lower back Pain Descriptors / Indicators: Grimacing Pain Intervention(s): Monitored during session;Patient requesting pain meds-RN notified    Home Living                      Prior Function            PT Goals (current goals can now be found in the care plan section) Acute Rehab PT Goals Patient Stated Goal: to regain strength PT Goal Formulation: With patient Time For Goal Achievement: 02/23/21 Progress towards PT goals: Progressing toward goals    Frequency    Min 3X/week      PT Plan Current plan remains appropriate    Co-evaluation              AM-PAC PT "6 Clicks" Mobility   Outcome Measure  Help needed turning from your back to your side while in a flat bed without using bedrails?: A Lot Help needed moving from lying on your back to sitting on the side of a flat bed without using bedrails?: A Lot Help  needed moving to and from a bed to a chair (including a wheelchair)?: A Lot Help needed standing up from a chair using your arms (e.g., wheelchair or bedside chair)?: A Lot Help needed to walk in hospital room?: A Little Help needed climbing 3-5 steps with a railing? : A Lot 6 Click Score: 13    End of Session Equipment Utilized During Treatment: Gait belt Activity Tolerance: Patient limited by pain Patient left: in bed;with call bell/phone within reach Nurse Communication: Mobility status PT Visit Diagnosis: Unsteadiness on feet (R26.81);History of falling (Z91.81);Difficulty in walking, not elsewhere classified (R26.2)     Time: 7124-5809 PT Time Calculation (min) (ACUTE ONLY): 24 min  Charges:  $Gait Training: 8-22 mins $Therapeutic Activity: 8-22 mins                     Alma Friendly, PTA Student  Acute Rehabilitation Services Pager : [phone] Office : [phone]

## 2021-02-14 NOTE — Assessment & Plan Note (Addendum)
-   s/p Right shoulder excisional debridement, including biceps tenolysis.Evacuation of purulent abscess, right subacromial space, as well as subcoracoid bursa.Right shoulder arthrotomy with irrigation and debridement of joint. Right shoulder open distal clavicle resection" - ortho following for repeat washout or imaging

## 2021-02-14 NOTE — Assessment & Plan Note (Signed)
-   Noted earlier in the admission and treated with [facility] - Follow BMP

## 2021-02-14 NOTE — Progress Notes (Signed)
Occupational Therapy Treatment Patient Details Name: John Ware MRN: [id number] DOB: November 12, 1965 Today's Date: 02/14/2021    History of present illness Patient is a 55 y.o. male presented to ED with weakness and per wife generally unwell over the past 3 weeks. He had a positive home COVID test on 01/12/21. He had no respiratory symptoms at the time. He just had fevers that were responsive to APAP. He self isolated at home until 01/20/21 when he was at a party and experienced a fall. bil shoulder and spine xrays negative.  MRI brain=Punctate acute or subacute infarct versus artifact in the high left  frontal lobe. Otherwise, no evidence of recent infarction.  Underwent bilateral MRIs of his shoulders and he is still having fevers.  At the left shoulder was concerned for septic arthritis and left axillary abscess.  Orthopedics was reconsulted as well as IR.  Pt s/p  Left shoulder excisional debridement, deep abscess, with arthrotomy and irrigation and debridement of the joint with biceps tenotomy and excision of necrotic rotator cuff on 02/08/21. Patient s/p Right shoulder excisional debridement, including biceps tenolysis, Evacuation of purulent abscess, right subacromial space, as well as subcoracoid bursa, Right shoulder arthrotomy with irrigation and debridement of joint, Right shoulder open distal clavicle resection on 6/18.  Pt also s/p Incision and drainage of multiple left lateral chest wall abscesses along with excisional debridement on 6/18. PMH: significant of chronic pain, HTN, DM2, depression, spinal stenosis.   OT comments  Attempt to have patient mobilize at shoulders more to assist with donning hospital gown however patient declines. States he either needs more pain medication (medicated prior to OT) or he is not moving at shoulders "they need more time to heal." Tried to educate patient on importance of mobility of joints however patient declines. Patient needing mod A for bed mobility at  trunk level due to pain/limited UE use and decreased core strength. With cues to use momentum and bed height elevated patient able to power up to standing with min A x2 for safety/line management. Patient ambulate to bathroom with rolling walker and min G assist, able to perform oral care and face hygiene, compensating by flexing head forward due to limited shoulder ROM. Patient then min G assist to ambulate to recliner chair, notified RN that wound vac alarming.    Follow Up Recommendations  CIR;SNF (CIR if patient appeals denial)    Equipment Recommendations  3 in 1 bedside commode       Precautions / Restrictions Precautions Precautions: Fall Precaution Comments: B UE slings for comfort Required Braces or Orthoses: Sling       Mobility Bed Mobility Overal bed mobility: Needs Assistance Bed Mobility: Supine to Sit     Supine to sit: Mod assist;HOB elevated     General bed mobility comments: patient is able to bring legs off edge of bed, mod A for trunk support due to limited shoulder ROM and pain    Transfers Overall transfer level: Needs assistance Equipment used: Rolling walker (2 wheeled) Transfers: Sit to/from Stand Sit to Stand: Min assist;+2 safety/equipment;From elevated surface         General transfer comment: cues to use momentum to stand, min A x2 for line management/safety    Balance Overall balance assessment: Needs assistance Sitting-balance support: Feet supported Sitting balance-[name]: Fair     Standing balance support: Single extremity supported;No upper extremity supported Standing balance-[name]: Fair Standing balance comment: intermittent use of unilateral upper extremity support at sink side  ADL either performed or assessed with clinical judgement   ADL Overall ADL's : Needs assistance/impaired     Grooming: Oral care;Wash/dry face;Supervision/safety;Standing Grooming Details (indicate cue  type and reason): patient able to apply tooth paste to brush and brush teeth at sink, does have to compensate and flex head forward due to limited shoulder ROM                 Toilet Transfer: Minimal assistance;+2 for safety/equipment;Ambulation;RW Toilet Transfer Details (indicate cue type and reason): min A x2 for safety/line management to power up to standing from elevated bed height. cue to use momentum         Functional mobility during ADLs: Minimal assistance;+2 for safety/equipment;Rolling walker        Cognition Arousal/Alertness: Awake/alert Behavior During Therapy: WFL for tasks assessed/performed Overall Cognitive Status: Within Functional Limits for tasks assessed                                          Exercises Exercises: Other exercises Other Exercises Other Exercises: Attempted to have patient engage in shoulder ROM however states he needs more pain medication "or I'm not doing it, they need to heal more."           Pertinent Vitals/ Pain       Pain Assessment: Faces Faces Pain Scale: Hurts even more Pain Location: B shoulders with attempted shoulder ROM Pain Descriptors / Indicators: Grimacing Pain Intervention(s): Premedicated before session         Frequency  Min 2X/week        Progress Toward Goals  OT Goals(current goals can now be found in the care plan section)  Progress towards OT goals: Progressing toward goals  Acute Rehab OT Goals Patient Stated Goal: to regain strength OT Goal Formulation: With patient Time For Goal Achievement: 02/28/21 Potential to Achieve Goals: Good ADL Goals Pt Will Perform Grooming: with set-up;with supervision;standing Pt Will Transfer to Toilet: with min guard assist;ambulating;regular height toilet;grab bars Pt Will Perform Toileting - Clothing Manipulation and hygiene: with min guard assist;sit to/from stand Additional ADL Goal #1: Patient will tolerate gentle PROM to bilateral  shoulder and demonstrate understanding of HEP within two weeks. Additional ADL Goal #2: Patient will demonstrate functional use of upper extremities with complaints of pain 3/10 or less within two weeks.  Plan Discharge plan remains appropriate       AM-PAC OT "6 Clicks" Daily Activity     Outcome Measure   Help from another person eating meals?: A Little Help from another person taking care of personal grooming?: A Little Help from another person toileting, which includes using toliet, bedpan, or urinal?: A Lot Help from another person bathing (including washing, rinsing, drying)?: A Lot Help from another person to put on and taking off regular upper body clothing?: A Lot Help from another person to put on and taking off regular lower body clothing?: Total 6 Click Score: 13    End of Session Equipment Utilized During Treatment: Gait belt;Rolling walker  OT Visit Diagnosis: Unsteadiness on feet (R26.81);Other abnormalities of gait and mobility (R26.89);History of falling (Z91.81);Muscle weakness (generalized) (M62.81);Pain Pain - Right/Left:  (bilateral) Pain - part of body: Shoulder   Activity Tolerance Patient tolerated treatment well   Patient Left in chair;with call bell/phone within reach   Nurse Communication Mobility status        Time: 0998-3382  OT Time Calculation (min): 32 min  Charges: OT General Charges $OT Visit: 1 Visit OT Treatments $Self Care/Home Management : 23-37 mins  Marlyce Huge OT OT pager: [phone]  Carmelia Roller 02/14/2021, 1:47 PM

## 2021-02-14 NOTE — Assessment & Plan Note (Signed)
-   Continue monitoring CBC

## 2021-02-14 NOTE — Progress Notes (Signed)
Inpatient Rehabilitation Admissions Coordinator   Noted continued acute medical issues. I am following progress from a distance.  Ottie Glazier, RN, MSN Rehab Admissions Coordinator [phone] 02/14/2021 11:48 AM

## 2021-02-14 NOTE — Assessment & Plan Note (Signed)
-  Complicates overall prognosis and care -Body mass index is 30.78 kg/m. -Weight Loss and Dietary Counseling given

## 2021-02-14 NOTE — Progress Notes (Signed)
Progress Note    John Ware   GOV:[id number]  DOB: 11-29-1965  DOA: 01/31/2021     14  PCP: Sharion Balloon, FNP  CC: Weakness, ongoing pain  Hospital Course: John Ware is a 55 y.o. male with medical history significant of chronic pain, HTN, DM2.  He had at home positive COVID test on 01/12/2021.  He had no respiratory symptoms at this time but he did have fevers that was responsive to Tylenol.  Reportedly he apparently fell out of his chair and hurt his left shoulder and he was evaluated by the ED and they found no fracture and he was given a sling and told to follow-up with orthopedics.  Since that time the patient's wife reports that he has had general body aches and pain and has increased symptoms that are not responsive to his chronic pain meds.  He is also had poor appetite during this time and has been unable to walk for last few days.  Because of these constantly some symptoms he is brought to the ED for further evaluation.  In the ED is found to have a sodium of 113 and a glucose of 339 and a WBC of 20.5 as well as a lactic acid of 3.8.  He started on broad-spectrum antibiotics and given fluids.  Soon after his blood cultures became positive for 4-4 MSSA.  He was transitioned to IV Ancef.  With further work-up and confirmation he was also found to have a large fluid collection of left lateral posterior chest and back.  He did have tenderness in his back.  He underwent MRI T-spine and CT of the chest abdomen pelvis.  There is no evidence of any osteomyelitis or discitis or other underlying infection on MRI T-spine.  CT of the chest did show a large multiloculated fluid collection.  IR was consulted and he underwent an ultrasound-guided aspiration was sent for culturing.  CT was concerning for evidence of septic emboli but 2D echo did not reveal any evidence of an endocarditis.  Cardiology was consulted for TEE per ID recommendations and he underwent TEE on 02/05/2021 which found a  large tricuspid valve vegetation with mild tricuspid regurg.  Subsequently he still complained of some shoulder pain so he underwent bilateral MRIs of his shoulders and he is still having fevers.  At the left shoulder was concerned for septic arthritis and left axillary abscess.  Orthopedics was reconsulted as well as IR and the orthopedic surgeon is planning to do a washout tomorrow in edition IR is going to place a drain on the residual fluid collection that may exist after washout.  Because of the surgery ID changed the patient to nafcillin however recommending continuing cefazolin at this time. ESR and CRP and both are elevated.   Patient underwent on 02/08/21 left shoulder excisional debridement, deep abscess with arthrotomy and irrigation debridement of the joint with biceps tenotomy and excision of the necrotic rotator cuff.  Patient is not medically ready to be D/C'd to CIR.   Postoperatively he had a CT scan and repeated and showed a marked increase in size of the abscess collection at the left lateral chest wall with persistent abscess again seen in the subscapular extending cranial to the apex of the left hemithorax.  Since he is post I&D it was unclear about how much of the increase in size of the abscess was due to the postsurgical changes progression of the abscess collection.  Patient did have some small bilateral pleural  effusions and bibasilar atelectasis as well as reactive left axillary adenopathy.  Post surgery patient was given Decadron 10 mg last night.  Because of the changes on his repeat CT scan with a left chest wall abscess with gas on CT General surgery was consulted and they are planning on taking the patient back to the OR to address his right shoulder along with a large left lateral chest wall abscess and this was done 6/18.  ID recommends continuing IV cefazolin currently   On 02/12/21 he had his Dressing Changes done. Cx showing Abundant Staph Aureus only resistant to  Ciprofloxacin and Erythomycin. Surgery recommending no further debridement at this time and start BID Dressing changes.   Overnight he had a Rapid Response given his elevated Fever and Vitals. Surgery now placed Wound VAC and Orthopedic Surgery now ordering repeat CT Scan of the Chest and Shoulders to evaluate for any undrained abscesses. [place] nurse evaluated   Interval History:  No events overnight.  Resting in bed when seen this morning.  Reviewed findings of CT with patient this morning.  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 This would be start date of abx likely. Final rec's per ID for length of duration of Ancef, likely 6 weeks; he would be a candidate for a PICC and if accepted to CIR they could continue abx there if approved, but not yet medically stable  Hyponatremia - Presumed due to dehydration and hypokalemia - severely low on admission, 113 - stabilizing out -BMP daily  Bacteremia due to Staphylococcus aureus-resolved as of 02/14/2021 - 4/4 blood cultures on admission growing MSSA -Repeated blood cultures on 02/01/2021; NGTD - TTE negative for vegetations; cardiology consulted for TEE, 02/05/21: POSITIVE for TV vegetation - now s/p IR drainage of left posterior chest wall fluid collection which was noted to be purulent in nature; culture also  growing MSSA - MRI T spine 02/01/21 negative for OM/discitis  - MRI brain 6/12 negative for pathology in visual cortex to explain "floaters" in visual fields; seen by ophtho, negative for endophthalmitis - continue ancef - ID following  Chest wall abscess -CT Scan showed "Marked increase in size of abscess collection at the LEFT lateral chest wall with persistent abscess again seen subscapular extending cranial to the apex of the LEFT hemithorax as described above. Since patient is post I & D of the chest wall abscess, it is uncertain how much of the increase in size of the abscess is due to postsurgical changes versus progression of abscess collection   Small BILATERAL pleural effusions and bibasilar atelectasis. Reactive LEFT axillary adenopathy." -General surgery evaluated and patient does have a large 17 x 16 x 6 cm fluid collection in the left lateral chest wall that does not have any communication to the actual thorax cavity.  General surgery feel this needs to be drained and he was taken to the OR by Dr. Redmond Pulling 02/10/21. -He is s/p "Incision and drainage  of multiple left lateral chest wall abscesses along with excisional debridement of left lateral chest wall skin, soft tissue, muscle, and fascia and axilla." -Surgery did dressing change this AM on 02/12/21 he had his Dressing Changes done. Cx showing Abundant Staph Aureus but Sensitivities pending and likely MSSA. Surgery recommending no further debridement at this time and start BID Dressing changes (Wet to Dry) -Cx Showing Abundant Staphylococcus Aures that is Resistant to Ciprofloxacin and Erythromycin -As Above Wound Vac now in place and [place] nurse consulted. BID Wet to Dry Dressing Changes and Orthopedic Surgery repeating Scans of shoulders and chest   Right shoulder pain - s/p Right shoulder excisional debridement, including biceps tenolysis. Evacuation of purulent abscess, right subacromial space, as well as subcoracoid bursa. Right  shoulder arthrotomy with irrigation and debridement of joint.  Right shoulder open distal clavicle resection" -Pain Control and will increase Morphine Frequency 2 mg from q4hprn to q2hprn Severe Pain  Left shoulder pain - See work-up above - Was previously seen in the ER after his fall and recommended for a sling with outpatient Ortho follow-up -Bilateral shoulder x-rays unremarkable but MRI left shoulder shows large abscess in left axilla; partial width tear of anterior supraspinatus, high-grade partial-thickness articular surface tear of the posterior supraspinatus, high-grade partial-thickness articular surface tear of superior subscapularis, complete tear of biceps long head tendon within the bicipital groove - now s/p left shoulder excisional debridement, deep abscess, with arthrotomy and irrigation and debridement of the joint with biceps tenotomy and excision of necrotic rotator cuff with ortho surgery -Repeat left shoulder CT on 02/13/2021 shows persistent abscess along left chest wall/axilla  Vision changes - Patient now stating on 02/04/2021 that he has been seeing "black floaters" in his field of vision for several days.  When questioned why he did not bring this up sooner, he did not have a good answer and did not want to "trouble [place]" - Etiology unclear but concern would be for septic emboli given CT chest also concerning for septic emboli to the lungs - MRI brain ordered: punctate acute or subacute infarct vs artifact in high left frontal lobe. This would not affect his vision as he is noting.  -Evaluated by ophthalmology, 02/05/2021.  No endophthalmitis  DKA (diabetic ketoacidosis) (HCC)-resolved as of 02/02/2021 - present on admission - responded to fluids and insulin - now resolved  Thrombocytosis - Considered in setting of underlying infection - Follow CBC  Normocytic anemia - Continue monitoring CBC  Obesity -Complicates overall prognosis and care -Body mass index is 30.78  kg/m. -Weight Loss and Dietary Counseling given  Ascending aorta dilatation (HCC) - TTE on 6/10 notes mild dilation of ascending aorta, 38 mm - follow TEE results as well: normal thoracic and ascending aorta noted on 02/05/21  Anxiety - Wellbutrin and Zoloft on hold for now in setting of hyponatremia  HTN (hypertension) - Pressure normal on admission.  Will treat if needed  Diabetes mellitus ([place]) - Continue SSI and CBG monitoring -Continue Lantus - A1c 10.2% on 01/31/2021  Right knee pain -Obtained X-Ray and showed "Medial joint space narrowing is noted with mild osteophytic change. Mild patellofemoral changes are noted as well. No joint effusion is seen. No acute fracture is noted."  Hyperkalemia-resolved as of 02/14/2021 - Noted earlier in the admission and treated with [facility] - Follow BMP   Old records reviewed in assessment of this patient  Antimicrobials: Cefepime 01/31/21 x 1 Rocephin 01/31/21 x 1 Vanc 01/31/21 x 1 Ancef 02/01/21 >> current  DVT prophylaxis: enoxaparin (LOVENOX) injection 40 mg Start: 02/11/21 1000 Sequential compression device to OR Start: 02/10/21 1413 SCDs Start: 02/08/21 2116   Code Status:   Code Status: Full Code Family Communication:   Disposition Plan: Status is: Inpatient  Remains inpatient appropriate because:Ongoing diagnostic testing needed not appropriate for outpatient work up, IV treatments appropriate due to intensity of illness or inability to take PO, and Inpatient level of care appropriate due to severity of illness  Dispo: The patient is from: Home              Anticipated d/c is to:  Possibly CIR              Patient currently is not medically stable to d/c.   Difficult to place patient No  Risk of unplanned readmission score: Unplanned Admission- Pilot do not use: 22.76   Objective: Blood pressure 108/76, pulse (!) 107, temperature 100.1 F (37.8 C), temperature source Oral, resp. rate 16, height _0  (1.778 m), weight 97.3  kg, SpO2 91 %.  Examination: General appearance: alert, cooperative, fatigued, and no distress Head: Normocephalic, without obvious abnormality, atraumatic Eyes:  EOMI; gross field of vision is intact Back:  Improved swelling Lungs: clear to auscultation bilaterally Chest wall:  Bilateral bandages noted over previously drained abscesses Heart: regular rate and rhythm, S1, S2 normal, and unable to appreciate any murmurs Abdomen: normal findings: bowel sounds normal and soft, non-tender Extremities: 1-2+ edema Skin: mobility and turgor normal Neurologic: Grossly normal  Consultants:  ID Orthopedic surgery General surgery  Procedures:  IR drainage of back fluid collection, 6/10  Data Reviewed: I have personally reviewed following labs and imaging studies Results for orders placed or performed during the hospital encounter of 01/31/21 (from the past 24 hour(s))  Glucose, capillary     Status: None   Collection Time: 02/13/21  5:27 PM  Result Value Ref Range   Glucose-Capillary 87 70 - 99 mg/dL  Glucose, capillary     Status: Abnormal   Collection Time: 02/13/21 10:20 PM  Result Value Ref Range   Glucose-Capillary 106 (H) 70 - 99 mg/dL  CBC with Differential/Platelet     Status: Abnormal   Collection Time: 02/14/21  2:12 AM  Result Value Ref Range   WBC 10.7 (H) 4.0 - 10.5 K/uL   RBC 2.45 (L) 4.22 - 5.81 MIL/uL   Hemoglobin 7.6 (L) 13.0 - 17.0 g/dL   HCT 23.2 (L) 39.0 - 52.0 %   MCV 94.7 80.0 - 100.0 fL   MCH 31.0 26.0 - 34.0 pg   MCHC 32.8 30.0 - 36.0 g/dL   RDW 15.2 11.5 - 15.5 %   Platelets 598 (H) 150 - 400 K/uL   nRBC 0.0 0.0 - 0.2 %   Neutrophils Relative % 77 %   Neutro Abs 8.2 (H) 1.7 - 7.7 K/uL   Lymphocytes Relative 15 %   Lymphs Abs 1.6 0.7 - 4.0 K/uL   Monocytes Relative 6 %   Monocytes Absolute 0.7 0.1 - 1.0 K/uL   Eosinophils Relative 1 %   Eosinophils Absolute 0.1 0.0 - 0.5 K/uL   Basophils Relative 0 %   Basophils Absolute 0.0 0.0 - 0.1 K/uL    Immature Granulocytes 1 %   Abs Immature Granulocytes 0.11 (H) 0.00 - 0.07 K/uL  Comprehensive metabolic panel     Status: Abnormal   Collection Time: 02/14/21  2:12 AM  Result Value Ref Range   Sodium 129 (L) 135 - 145 mmol/L  Potassium 4.5 3.5 - 5.1 mmol/L   Chloride 94 (L) 98 - 111 mmol/L   CO2 28 22 - 32 mmol/L   Glucose, Bld 157 (H) 70 - 99 mg/dL   BUN 7 6 - 20 mg/dL   Creatinine, Ser 0.51 (L) 0.61 - 1.24 mg/dL   Calcium 8.2 (L) 8.9 - 10.3 mg/dL   Total Protein 5.2 (L) 6.5 - 8.1 g/dL   Albumin 1.8 (L) 3.5 - 5.0 g/dL   AST 53 (H) 15 - 41 U/L   ALT 30 0 - 44 U/L   Alkaline Phosphatase 53 38 - 126 U/L   Total Bilirubin 0.7 0.3 - 1.2 mg/dL   GFR, Estimated >60 >60 mL/min   Anion gap 7 5 - 15  Magnesium     Status: Abnormal   Collection Time: 02/14/21  2:12 AM  Result Value Ref Range   Magnesium 1.5 (L) 1.7 - 2.4 mg/dL  Phosphorus     Status: Abnormal   Collection Time: 02/14/21  2:12 AM  Result Value Ref Range   Phosphorus 4.9 (H) 2.5 - 4.6 mg/dL  Glucose, capillary     Status: Abnormal   Collection Time: 02/14/21  7:29 AM  Result Value Ref Range   Glucose-Capillary 126 (H) 70 - 99 mg/dL  Glucose, capillary     Status: Abnormal   Collection Time: 02/14/21 11:43 AM  Result Value Ref Range   Glucose-Capillary 152 (H) 70 - 99 mg/dL    Recent Results (from the past 240 hour(s))  Fungus Culture With Stain     Status: None (Preliminary result)   Collection Time: 02/08/21  6:48 PM   Specimen: Synovial, Left Shoulder; Body Fluid  Result Value Ref Range Status   Fungus Stain Final report  Final    Comment: (NOTE) Performed At: [facility] [address] [place], [place] [id number] Rush Farmer MD PQ:[id number]    Fungus (Mycology) Culture PENDING  Incomplete   Fungal Source SHOULDER  Final    Comment: LEFT Performed at [facility], [place] [address] [place], [place] [address]   Aerobic/Anaerobic Culture w Gram Stain (surgical/deep wound)      Status: None   Collection Time: 02/08/21  6:48 PM   Specimen: Synovial, Left Shoulder; Body Fluid  Result Value Ref Range Status   Specimen Description   Final    SHOULDER LEFT Performed at [place] [address] [place], [place] [address]    Special Requests   Final    NONE Performed at [facility], [place] [address], [place], [place] [address]    Gram Stain   Final    ABUNDANT WBC PRESENT,BOTH PMN AND MONONUCLEAR RARE GRAM POSITIVE COCCI IN PAIRS    Culture   Final    RARE STAPHYLOCOCCUS AUREUS NO ANAEROBES ISOLATED Performed at [facility], [place] [address] [place], [place] [address]    Report Status 02/14/2021 FINAL  Final   Organism ID, Bacteria STAPHYLOCOCCUS AUREUS  Final      Susceptibility   Staphylococcus aureus - MIC*    CIPROFLOXACIN >=8 RESISTANT Resistant     ERYTHROMYCIN >=8 RESISTANT Resistant     GENTAMICIN <=0.5 SENSITIVE Sensitive     OXACILLIN 0.5 SENSITIVE Sensitive     TETRACYCLINE <=1 SENSITIVE Sensitive     VANCOMYCIN 1 SENSITIVE Sensitive     TRIMETH/SULFA <=10 SENSITIVE Sensitive     CLINDAMYCIN <=0.25 SENSITIVE Sensitive     RIFAMPIN <=0.5 SENSITIVE Sensitive  Inducible Clindamycin NEGATIVE Sensitive     * RARE STAPHYLOCOCCUS AUREUS  Fungus Culture Result     Status: None   Collection Time: 02/08/21  6:48 PM  Result Value Ref Range Status   Result 1 Comment  Final    Comment: (NOTE) KOH/Calcofluor preparation:  no fungus observed. Performed At: [facility] [place], [place] [id number] Rush Farmer MD XJ:[id number]   Fungus Culture With Stain     Status: None (Preliminary result)   Collection Time: 02/08/21  6:52 PM   Specimen: Joint, Other; Body Fluid  Result Value Ref Range Status   Fungus Stain Final report  Final    Comment: (NOTE) Performed At: [facility] [place], [place] [id number] Rush Farmer MD [id number]    Fungus  (Mycology) Culture PENDING  Incomplete   Fungal Source SHOULDER  Final    Comment: LEFT BONE CHIPS Performed at [facility], [place] [address], [place], [place] [address]   Aerobic/Anaerobic Culture w Gram Stain (surgical/deep wound)     Status: None   Collection Time: 02/08/21  6:52 PM   Specimen: Joint, Other; Body Fluid  Result Value Ref Range Status   Specimen Description   Final    SHOULDER LEFT BONE CHIPS Performed at [place] [address], [place], [place] [address]    Special Requests   Final    NONE Performed at [facility], [place] [address], [place], [place] [address]    Gram Stain NO WBC SEEN NO ORGANISMS SEEN   Final   Culture   Final    RARE STAPHYLOCOCCUS AUREUS NO ANAEROBES ISOLATED Performed at [facility], [place] [address] [place], [place] [address]    Report Status 02/14/2021 FINAL  Final   Organism ID, Bacteria STAPHYLOCOCCUS AUREUS  Final      Susceptibility   Staphylococcus aureus - MIC*    CIPROFLOXACIN >=8 RESISTANT Resistant     ERYTHROMYCIN >=8 RESISTANT Resistant     GENTAMICIN <=0.5 SENSITIVE Sensitive     OXACILLIN 0.5 SENSITIVE Sensitive     TETRACYCLINE <=1 SENSITIVE Sensitive     VANCOMYCIN 1 SENSITIVE Sensitive     TRIMETH/SULFA <=10 SENSITIVE Sensitive     CLINDAMYCIN <=0.25 SENSITIVE Sensitive     RIFAMPIN <=0.5 SENSITIVE Sensitive     Inducible Clindamycin NEGATIVE Sensitive     * RARE STAPHYLOCOCCUS AUREUS  Fungus Culture Result     Status: None   Collection Time: 02/08/21  6:52 PM  Result Value Ref Range Status   Result 1 Comment  Final    Comment: (NOTE) KOH/Calcofluor preparation:  no fungus observed. Performed At: [facility] [place], [place] [id number] Rush Farmer MD UO:[id number]   Aerobic/Anaerobic Culture w Gram Stain (surgical/deep wound)     Status: None (Preliminary result)   Collection Time: 02/10/21 10:22 AM   Specimen:  Wound  Result Value Ref Range Status   Specimen Description   Final    WOUND CHEST Performed at [place] [address], [place], [place] [address]    Special Requests   Final    NONE Performed at [facility], [place] [address] [place], [place] [address]    Gram Stain   Final    ABUNDANT WBC PRESENT,BOTH PMN AND MONONUCLEAR MODERATE GRAM POSITIVE COCCI Performed at [facility], [place] [address] [place], [place] [address]    Culture   Final  ABUNDANT STAPHYLOCOCCUS AUREUS NO ANAEROBES ISOLATED; CULTURE IN PROGRESS FOR 5 DAYS    Report Status PENDING  Incomplete   Organism ID, Bacteria STAPHYLOCOCCUS AUREUS  Final      Susceptibility   Staphylococcus aureus - MIC*    CIPROFLOXACIN >=8 RESISTANT Resistant     ERYTHROMYCIN >=8 RESISTANT Resistant     GENTAMICIN <=0.5 SENSITIVE Sensitive     OXACILLIN 0.5 SENSITIVE Sensitive     TETRACYCLINE <=1 SENSITIVE Sensitive     VANCOMYCIN 1 SENSITIVE Sensitive     TRIMETH/SULFA <=10 SENSITIVE Sensitive     CLINDAMYCIN <=0.25 SENSITIVE Sensitive     RIFAMPIN <=0.5 SENSITIVE Sensitive     Inducible Clindamycin NEGATIVE Sensitive     * ABUNDANT STAPHYLOCOCCUS AUREUS  Culture, blood (single)     Status: None (Preliminary result)   Collection Time: 02/13/21  5:51 AM   Specimen: BLOOD  Result Value Ref Range Status   Specimen Description   Final    BLOOD RIGHT ANTECUBITAL Performed at [place] [address], [place], [place] [address]    Special Requests   Final    BOTTLES DRAWN AEROBIC ONLY Blood Culture adequate volume Performed at [place] [address] [place], [place] [address]    Culture   Final    NO GROWTH < 24 HOURS Performed at [facility] [address] [place], [place] [address]    Report Status PENDING  Incomplete     Radiology Studies: CT CHEST WO CONTRAST  Result Date: 02/13/2021 CLINICAL DATA:  History of  shoulder infection. EXAM: CT CHEST WITHOUT CONTRAST TECHNIQUE: Multidetector CT imaging of the chest was performed following the standard protocol without IV contrast. COMPARISON:  CT chest 02/09/2021 FINDINGS: Cardiovascular: The heart size is normal. No substantial pericardial effusion. No thoracic aortic aneurysm. Mediastinum/Nodes: No mediastinal lymphadenopathy. No evidence for gross hilar lymphadenopathy although assessment is limited by the lack of intravenous contrast on today's study. The esophagus has normal imaging features. Bulky lymphadenopathy is again identified in the left axilla. Lungs/Pleura: 7 mm subpleural left upper lobe pulmonary nodule on 71/7 is similar to prior. 6 mm nodule in the medial left upper lobe (91/7) and 12 mm nodule in the posterior left upper lobe are similar to prior. Bibasilar collapse/consolidation is not substantially changed in the interval. Probable tiny bilateral effusions. Upper Abdomen: Unremarkable. Musculoskeletal: Soft tissue gas and fluid is identified in the left supraclavicular region, around the left shoulder, and in the lateral and posterior left chest wall. Fluid and gas in the supraclavicular region tracking posteriorly into the subscapular space is similar to prior. Diffuse edema noted in the muscles of the rotator cuff. Surgical drain noted posterolateral lower left thoracic wall with interval decrease in soft tissue gas and swelling in this region. There is an open wound in the region of the cervical drain. Gas and fluid are seen in the region of the right shoulder joint, with more gas visible on today's study than on the previous exam. Diffuse edema noted in the muscles of the rotator cuff. IMPRESSION: 1. Interval drain placement in the lateral and posterolateral left chest wall with decrease in the volume of gas and debris in the chest wall. 2. Similar gas in fluid in the left supraclavicular space and region of the left shoulder joint with diffuse edema  of the rotator cuff muscles. 3. Gas is now visible in the soft tissues anterior to the right shoulder with similar appearance of fluid and  edema in the soft tissues and muscles around the right shoulder. Electronically Signed   By: Misty Stanley M.D.   On: 02/13/2021 17:37   CT SHOULDER LEFT WO CONTRAST  Result Date: 02/14/2021 CLINICAL DATA:  Septic arthritis suspected, shoulder, xray done septic arthritis left shoulder EXAM: CT OF THE UPPER LEFT EXTREMITY WITHOUT CONTRAST TECHNIQUE: Multidetector CT imaging of the upper left extremity was performed according to the standard protocol. COMPARISON:  CT 02/09/2021 FINDINGS: Bones/Joint/Cartilage There is no evidence of acute fracture. There is no new osseous erosion, destruction, or periosteal reaction. Persisting gas containing glenohumeral joint effusion and distention of the subacromial-subdeltoid bursa. Some of this gas/fluid is collecting in the subcoracoid recess. Ligaments Suboptimally assessed by CT. Muscles and Tendons There is persisting gas and fluid seen along the left upper chest wall/axilla, with most superior extent extending up the neck to the level of C6-C7 (axial image 3). There is persistent foci of intercostal gas (axial image 102/105). There is paraspinal muscle gas is well (axial image 82 and 51). Discrete abscesses are difficult to measure, with most well-defined area measuring 4.0 x 1.9 cm in the transverse plane by 7.5 cm in craniocaudal extent. Soft tissues As above. Left axillary lymphadenopathy. Small left pleural effusion. There is surgical material, likely packing along the left posterolateral soft tissues on axial image 112). IMPRESSION: Persistent, extensive abscess along the left chest wall/axilla, with visible gas/fluid extending superiorly in the neck to the level of C6/C7, and posteriorly to the paraspinal musculature. Most well-defined collection is within the axilla measuring 4.0 x 1.9 cm in the transverse plane and 7.5 cm in  craniocaudal extent. Gas containing left shoulder joint effusion and distension of the subacromial-subdeltoid bursa, concerning for septic arthritis/bursitis, though these findings could in part be related to recent surgery. No new osseous abnormality to suggest osteomyelitis, though note CT is insensitive in detecting osteomyelitis. Electronically Signed   By: Maurine Simmering   On: 02/14/2021 07:34   CT SHOULDER RIGHT WO CONTRAST  Result Date: 02/14/2021 CLINICAL DATA:  Septic arthritis suspected, shoulder, xray done septic arthritis right shoulder EXAM: CT OF THE UPPER RIGHT EXTREMITY WITHOUT CONTRAST TECHNIQUE: Multidetector CT imaging of the upper right extremity was performed according to the standard protocol. COMPARISON:  Right shoulder MRI 02/07/2021, CT 02/01/2021 FINDINGS: Bones/Joint/Cartilage Postsurgical changes of the right shoulder, including AC joint resection. There is soft tissue gas in fluid anteriorly, likely surgical related. There a small glenohumeral joint effusion and small amount of fluid in the subacromial-subdeltoid bursa. Ligaments Suboptimally assessed by CT. Muscles and Tendons Findings of full-thickness cuff tear. Soft tissues There is soft tissue swelling along the shoulder. In subcutaneous edema extending down the arm. IMPRESSION: Postsurgical changes at the right shoulder including resection at the [facility] joint. Soft tissue gas and fluid anteriorly, likely related to recent surgery. Small glenohumeral joint effusion with mild distention of the subacromial-subdeltoid bursa. There is soft tissue edema without no well-defined soft tissue abscess along the right shoulder. No new osseous changes to suggest osteomyelitis, though note CT is insensitive in detecting osteomyelitis. Electronically Signed   By: Maurine Simmering   On: 02/14/2021 07:44   DG CHEST PORT 1 VIEW  Result Date: 02/13/2021 CLINICAL DATA:  Respiratory compromise EXAM: PORTABLE CHEST 1 VIEW COMPARISON:  02/10/2021 FINDINGS:  Low lung volumes. Hazy atelectasis at the bases. Small left pleural effusion. Stable cardiomediastinal silhouette. No pneumothorax. IMPRESSION: Low lung volumes with small left effusion and hazy atelectasis at the bases Electronically Signed  By: Donavan Foil M.D.   On: 02/13/2021 23:28   DG CHEST PORT 1 VIEW  Final Result    CT SHOULDER LEFT WO CONTRAST  Final Result    CT SHOULDER RIGHT WO CONTRAST  Final Result    CT CHEST WO CONTRAST  Final Result    DG Chest Port 1 View  Final Result    CT SHOULDER LEFT WO CONTRAST  Final Result    CT CHEST WO CONTRAST  Final Result    DG Knee 1-2 Views Right  Final Result    MR Shoulder Right W Wo Contrast  Final Result    MR Shoulder Left W Wo Contrast  Final Result    MR BRAIN WO CONTRAST  Final Result    IR US Guide Bx Asp/Drain  Final Result    CT CHEST ABDOMEN PELVIS W CONTRAST  Final Result    DG Shoulder Left  Final Result    DG Shoulder Right  Final Result    MR THORACIC SPINE W WO CONTRAST  Final Result    DG Chest Port 1 View  Final Result    MR THORACIC SPINE W CONTRAST    (Results Pending)  MR LUMBAR SPINE W CONTRAST    (Results Pending)  MR CERVICAL SPINE W CONTRAST    (Results Pending)    Scheduled Meds:  Chlorhexidine Gluconate Cloth  6 each Topical Daily   enoxaparin (LOVENOX) injection  40 mg Subcutaneous Q24H   gabapentin  800 mg Oral TID   insulin aspart  0-15 Units Subcutaneous TID WC   insulin aspart  0-5 Units Subcutaneous QHS   insulin aspart  4 Units Subcutaneous TID WC   insulin glargine  15 Units Subcutaneous Daily   mouth rinse  15 mL Mouth Rinse BID   oxyCODONE  20 mg Oral Q12H   polyethylene glycol  17 g Oral Daily   povidone-iodine  2 application Topical Once   senna-docusate  1 tablet Oral BID   PRN Meds: sodium chloride, acetaminophen, alum & mag hydroxide-simeth, docusate sodium, lip balm, magic mouthwash w/lidocaine, methocarbamol, metoCLOPramide **OR** metoCLOPramide  (REGLAN) injection, morphine injection, ondansetron (ZOFRAN) IV, oxyCODONE, traZODone Continuous Infusions:  sodium chloride Stopped (02/07/21 0508)    ceFAZolin (ANCEF) IV 2 g (02/14/21 0742)     LOS: 14 days  Time spent: Greater than 50% of the 35 minute visit was spent in counseling/coordination of care for the patient as laid out in the A&P.   Dwyane Dee, MD [facility] 02/14/2021, 2:33 PM

## 2021-02-14 NOTE — Assessment & Plan Note (Addendum)
-  CT Scan showed "Marked increase in size of abscess collection at the LEFT lateral chest wall with persistent abscess again seen subscapular extending cranial to the apex of the LEFT hemithorax as described above. Since patient is post I & D of the chest wall abscess, it is uncertain how much of the increase in size of the abscess is due to postsurgical changes versus progression of abscess collection  Small BILATERAL pleural effusions and bibasilar atelectasis. Reactive LEFT axillary adenopathy." -General surgery evaluated and patient does have a large 17 x 16 x 6 cm fluid collection in the left lateral chest wall that does not have any communication to the actual thorax cavity.  General surgery feel this needs to be drained and he was taken to the OR by Dr. Andrey Campanile 02/10/21. -He is s/p "Incision and drainage of multiple left lateral chest wall abscesses along with excisional debridement of left lateral chest wall skin, soft tissue, muscle, and fascia and axilla." -Surgery did dressing change this AM on 02/12/21 he had his Dressing Changes done. Cx showing Abundant Staph Aureus but Sensitivities pending and likely MSSA. Surgery recommending no further debridement at this time and start BID Dressing changes (Wet to Dry) -Cx Showing Abundant Staphylococcus Aures that is Resistant to Ciprofloxacin and Erythromycin -As Above Wound Vac now in place and WOC nurse consulted. BID Wet to Dry Dressing Changes and Orthopedic Surgery repeating Scans of shoulders and chest  - s/p left supraclavicular IR abscess drainage on 6/23

## 2021-02-15 ENCOUNTER — Inpatient Hospital Stay (HOSPITAL_COMMUNITY): Payer: Commercial Managed Care - PPO

## 2021-02-15 DIAGNOSIS — K6812 Psoas muscle abscess: Secondary | ICD-10-CM

## 2021-02-15 LAB — AEROBIC/ANAEROBIC CULTURE W GRAM STAIN (SURGICAL/DEEP WOUND)

## 2021-02-15 LAB — CBC WITH DIFFERENTIAL/PLATELET
Abs Immature Granulocytes: 0.07 10*3/uL (ref 0.00–0.07)
Basophils Absolute: 0.1 10*3/uL (ref 0.0–0.1)
Basophils Relative: 1 %
Eosinophils Absolute: 0.1 10*3/uL (ref 0.0–0.5)
Eosinophils Relative: 1 %
HCT: 24.1 % — ABNORMAL LOW (ref 39.0–52.0)
Hemoglobin: 7.7 g/dL — ABNORMAL LOW (ref 13.0–17.0)
Immature Granulocytes: 1 %
Lymphocytes Relative: 16 %
Lymphs Abs: 1.7 10*3/uL (ref 0.7–4.0)
MCH: 30.6 pg (ref 26.0–34.0)
MCHC: 32 g/dL (ref 30.0–36.0)
MCV: 95.6 fL (ref 80.0–100.0)
Monocytes Absolute: 0.8 10*3/uL (ref 0.1–1.0)
Monocytes Relative: 7 %
Neutro Abs: 7.9 10*3/uL — ABNORMAL HIGH (ref 1.7–7.7)
Neutrophils Relative %: 74 %
Platelets: 537 10*3/uL — ABNORMAL HIGH (ref 150–400)
RBC: 2.52 MIL/uL — ABNORMAL LOW (ref 4.22–5.81)
RDW: 14.9 % (ref 11.5–15.5)
WBC: 10.5 10*3/uL (ref 4.0–10.5)
nRBC: 0 % (ref 0.0–0.2)

## 2021-02-15 LAB — GLUCOSE, CAPILLARY
Glucose-Capillary: 122 mg/dL — ABNORMAL HIGH (ref 70–99)
Glucose-Capillary: 114 mg/dL — ABNORMAL HIGH (ref 70–99)
Glucose-Capillary: 76 mg/dL (ref 70–99)
Glucose-Capillary: 127 mg/dL — ABNORMAL HIGH (ref 70–99)

## 2021-02-15 LAB — MAGNESIUM: Magnesium: 1.9 mg/dL (ref 1.7–2.4)

## 2021-02-15 LAB — BASIC METABOLIC PANEL
Anion gap: 8 (ref 5–15)
BUN: 9 mg/dL (ref 6–20)
CO2: 30 mmol/L (ref 22–32)
Calcium: 8.3 mg/dL — ABNORMAL LOW (ref 8.9–10.3)
Chloride: 93 mmol/L — ABNORMAL LOW (ref 98–111)
Creatinine, Ser: 0.59 mg/dL — ABNORMAL LOW (ref 0.61–1.24)
GFR, Estimated: >60 mL/min (ref >60)
Glucose, Bld: 126 mg/dL — ABNORMAL HIGH (ref 70–99)
Potassium: 4.1 mmol/L (ref 3.5–5.1)
Sodium: 131 mmol/L — ABNORMAL LOW (ref 135–145)

## 2021-02-15 MED ORDER — GADOBUTROL 1 MMOL/ML IV SOLN
10.0000 mL | Freq: Once | INTRAVENOUS | Status: AC | PRN
  Administered 2021-02-15: 10 mL via INTRAVENOUS

## 2021-02-15 MED ORDER — FENTANYL CITRATE (PF) 100 MCG/2ML IJ SOLN
INTRAMUSCULAR | Status: AC
  Filled 2021-02-15: qty 4

## 2021-02-15 MED ORDER — SODIUM CHLORIDE 0.9% FLUSH
5.0000 mL | Freq: Three times a day (TID) | INTRAVENOUS | Status: DC
  Administered 2021-02-15 – 2021-03-15 (×42): 5 mL

## 2021-02-15 MED ORDER — LIDOCAINE-EPINEPHRINE (PF) 2 %-1:200000 IJ SOLN
INTRAMUSCULAR | Status: AC
  Filled 2021-02-15: qty 20

## 2021-02-15 MED ORDER — MIDAZOLAM HCL 2 MG/2ML IJ SOLN
INTRAMUSCULAR | Status: AC | PRN
  Administered 2021-02-15 (×2): 1 mg via INTRAVENOUS

## 2021-02-15 MED ORDER — MIDAZOLAM HCL 2 MG/2ML IJ SOLN
INTRAMUSCULAR | Status: AC
  Filled 2021-02-15: qty 4

## 2021-02-15 MED ORDER — LIDOCAINE HCL 1 % IJ SOLN
INTRAMUSCULAR | Status: AC
  Filled 2021-02-15: qty 20

## 2021-02-15 MED ORDER — FENTANYL CITRATE (PF) 100 MCG/2ML IJ SOLN
INTRAMUSCULAR | Status: AC | PRN
  Administered 2021-02-15 (×2): 50 ug via INTRAVENOUS

## 2021-02-15 MED ORDER — LIDOCAINE-EPINEPHRINE 1 %-1:100000 IJ SOLN
INTRAMUSCULAR | Status: AC | PRN
  Administered 2021-02-15: 10 mL via INTRADERMAL

## 2021-02-15 MED ORDER — FENTANYL CITRATE (PF) 100 MCG/2ML IJ SOLN
INTRAMUSCULAR | Status: AC | PRN
  Administered 2021-02-15: 50 ug via INTRAVENOUS

## 2021-02-15 NOTE — Progress Notes (Signed)
MEDICATION-RELATED CONSULT NOTE   IR Procedure Consult - Anticoagulant/Antiplatelet PTA/Inpatient Med List Review by Pharmacist    Procedure: CT guided placement of a 10 Fr drainage into the right paraspinal abscess     Completed: 02/15/21 16:49  Post-Procedural bleeding risk per IR MD assessment:  standard  Antithrombotic medications on inpatient or PTA profile prior to procedure:   LWMH 40 qday    Recommended restart time per IR Post-Procedure Guidelines:  Day + 1 (next AM)   Other considerations:      Plan:    LMWH 40 q24 to resume 6/24 10 am  Herby Abraham, Pharm.D 02/15/2021 5:34 PM

## 2021-02-15 NOTE — Assessment & Plan Note (Addendum)
-   noted on MRIs done on 6/22 - s/p B/L paraspinal abscess drain placement with IR on 6/23 - likely needs repeat imaging after output decreases; plan per IR

## 2021-02-15 NOTE — Sedation Documentation (Signed)
Patient transported to CT via bed by this RN.

## 2021-02-15 NOTE — Procedures (Signed)
Pre Procedure Dx: Left supraclavicular fluid collection Post Procedural Dx: Same  Successful US guided left supraclavicular drain placement yielding 40 cc of bloody fluid. Sample sent to lab for analysis.  Drain connected to JP bulb.  EBL: None No immediate complications.   Katherina Right, MD Pager #: [phone]

## 2021-02-15 NOTE — Progress Notes (Signed)
Progress Note    CHARAN PRIETO   RDE:[id number]  DOB: 08-Jul-1966  DOA: 01/31/2021     15  PCP: Sharion Balloon, FNP  CC: Weakness, ongoing pain  Hospital Course: NHIA HEAPHY is a 55 y.o. male with medical history significant of chronic pain, HTN, DM2.  He had at home positive COVID test on 01/12/2021.  He had no respiratory symptoms at this time but he did have fevers that was responsive to Tylenol.  Reportedly he apparently fell out of his chair and hurt his left shoulder and he was evaluated by the ED and they found no fracture and he was given a sling and told to follow-up with orthopedics.  Since that time the patient's wife reports that he has had general body aches and pain and has increased symptoms that are not responsive to his chronic pain meds.  He is also had poor appetite during this time and has been unable to walk for last few days.  Because of these constantly some symptoms he is brought to the ED for further evaluation.  In the ED is found to have a sodium of 113 and a glucose of 339 and a WBC of 20.5 as well as a lactic acid of 3.8.  He started on broad-spectrum antibiotics and given fluids.  Soon after his blood cultures became positive for 4-4 MSSA.  He was transitioned to IV Ancef.  With further work-up and confirmation he was also found to have a large fluid collection of left lateral posterior chest and back.  He did have tenderness in his back.  He underwent MRI T-spine and CT of the chest abdomen pelvis.  There is no evidence of any osteomyelitis or discitis or other underlying infection on MRI T-spine.  CT of the chest did show a large multiloculated fluid collection.  IR was consulted and he underwent an ultrasound-guided aspiration was sent for culturing.  CT was concerning for evidence of septic emboli but 2D echo did not reveal any evidence of an endocarditis.  Cardiology was consulted for TEE per ID recommendations and he underwent TEE on 02/05/2021 which found a  large tricuspid valve vegetation with mild tricuspid regurg.  Subsequently he still complained of some shoulder pain so he underwent bilateral MRIs of his shoulders and he is still having fevers.  At the left shoulder was concerned for septic arthritis and left axillary abscess.  Orthopedics was reconsulted as well as IR and the orthopedic surgeon is planning to do a washout tomorrow in edition IR is going to place a drain on the residual fluid collection that may exist after washout.  Because of the surgery ID changed the patient to nafcillin however recommending continuing cefazolin at this time. ESR and CRP and both are elevated.   Patient underwent on 02/08/21 left shoulder excisional debridement, deep abscess with arthrotomy and irrigation debridement of the joint with biceps tenotomy and excision of the necrotic rotator cuff.  Patient is not medically ready to be D/C'd to CIR.   Postoperatively he had a CT scan and repeated and showed a marked increase in size of the abscess collection at the left lateral chest wall with persistent abscess again seen in the subscapular extending cranial to the apex of the left hemithorax.  Since he is post I&D it was unclear about how much of the increase in size of the abscess was due to the postsurgical changes progression of the abscess collection.  Patient did have some small bilateral pleural  effusions and bibasilar atelectasis as well as reactive left axillary adenopathy.  Post surgery patient was given Decadron 10 mg last night.  Because of the changes on his repeat CT scan with a left chest wall abscess with gas on CT General surgery was consulted and they are planning on taking the patient back to the OR to address his right shoulder along with a large left lateral chest wall abscess and this was done 6/18.  ID recommends continuing IV cefazolin currently   On 02/12/21 he had his Dressing Changes done. Cx showing Abundant Staph Aureus only resistant to  Ciprofloxacin and Erythomycin. Surgery recommending no further debridement at this time and start BID Dressing changes.   Overnight he had a Rapid Response given his elevated Fever and Vitals. Surgery now placed Wound VAC and Orthopedic Surgery now ordering repeat CT Scan of the Chest and Shoulders to evaluate for any undrained abscesses. [place] nurse evaluated   Interval History:  No events overnight.  Reviewed findings of MRI with him this morning.  Tentative plan is for IR drainage of the ongoing chest wall abscess as well as drainage of his bilateral psoas muscle abscesses.  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 This would be start date of abx likely. Final rec's per ID for length of duration of Ancef, likely 6 weeks; he would be a candidate for a PICC and if accepted to CIR they could continue abx there if approved, but not yet medically stable  Hyponatremia - Presumed due to dehydration and hypokalemia - severely low on admission, 113 - stabilizing out -BMP daily  Bacteremia due to Staphylococcus aureus-resolved as of 02/14/2021 - 4/4 blood cultures on admission growing MSSA -Repeated blood cultures on 02/01/2021; NGTD - TTE negative for vegetations; cardiology consulted for TEE, 02/05/21: POSITIVE for TV vegetation - now s/p IR drainage of left  posterior chest wall fluid collection which was noted to be purulent in nature; culture also growing MSSA - MRI T spine 02/01/21 negative for OM/discitis  - MRI brain 6/12 negative for pathology in visual cortex to explain "floaters" in visual fields; seen by ophtho, negative for endophthalmitis - continue ancef - ID following  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 Since patient is post I & D of the chest wall abscess, it is uncertain how much of the increase in size of the abscess is due to postsurgical changes versus progression of abscess collection   Small BILATERAL pleural effusions and bibasilar atelectasis. Reactive LEFT axillary adenopathy." -General surgery evaluated and patient does have a large 17 x 16 x 6 cm fluid collection in the left lateral chest wall  that does not have any communication to the actual thorax cavity.  General surgery feel this needs to be drained and he was taken to the OR by Dr. Redmond Pulling 02/10/21. -He is s/p "Incision and drainage of multiple left lateral chest wall abscesses along with excisional debridement of left lateral chest wall skin, soft tissue, muscle, and fascia and axilla." -Surgery did dressing change this AM on 02/12/21 he had his Dressing Changes done. Cx showing Abundant Staph Aureus but Sensitivities pending and likely MSSA. Surgery recommending no further debridement at this time and start BID Dressing changes (Wet to Dry) -Cx Showing Abundant Staphylococcus Aures that is Resistant to Ciprofloxacin and Erythromycin -As Above Wound Vac now in place and [place] nurse consulted. BID Wet to Dry Dressing Changes and Orthopedic Surgery repeating Scans of shoulders and  chest  - IR drainage of left neck/supraclavicular abscess drainage planned for 6/23  Right shoulder pain - s/p Right shoulder excisional debridement, including biceps tenolysis. Evacuation of purulent abscess, right subacromial space, as well as subcoracoid bursa. Right shoulder arthrotomy with irrigation and debridement of joint.  Right shoulder open distal clavicle resection"  Left shoulder pain - See work-up above - Was previously seen in the ER after his fall and recommended for a sling with outpatient Ortho follow-up -Bilateral shoulder x-rays unremarkable but MRI left shoulder shows large abscess in left axilla; partial width tear of anterior supraspinatus, high-grade partial-thickness articular surface tear of the posterior supraspinatus, high-grade partial-thickness articular surface tear of superior subscapularis, complete tear of biceps long head tendon within the bicipital groove - now s/p left shoulder excisional debridement, deep abscess, with arthrotomy and irrigation and debridement of the joint with biceps tenotomy and excision of necrotic rotator cuff with ortho surgery -Repeat left shoulder CT on 02/13/2021 shows persistent abscess along left chest wall/axilla; IR to drain on 6/23  Vision changes - Patient now stating on 02/04/2021 that he has been seeing "black floaters" in his field of vision for several days.  When questioned why he did not bring this up sooner, he did not have a good answer and did not want to "trouble [place]" - Etiology unclear but concern would be for septic emboli given CT chest also concerning for septic emboli to the lungs - MRI brain ordered: punctate acute or subacute infarct vs artifact in high left frontal lobe. This would not affect his vision as he is noting.  -Evaluated by ophthalmology, 02/05/2021.  No endophthalmitis  DKA (diabetic ketoacidosis) (HCC)-resolved as of 02/02/2021 - present on admission - responded to fluids and insulin - now  resolved  Thrombocytosis - Considered in setting of underlying infection - Follow CBC  Normocytic anemia - Continue monitoring CBC  Obesity -Complicates overall prognosis and care -Body mass index is 30.78 kg/m. -Weight Loss and Dietary Counseling given  Ascending aorta dilatation (HCC) - TTE on 6/10 notes mild dilation of ascending aorta, 38 mm - follow TEE results as well: normal thoracic and ascending aorta noted on 02/05/21  Anxiety - Wellbutrin and Zoloft on hold for now in setting of hyponatremia  HTN (hypertension) - Pressure normal on admission.  Will treat if needed  Diabetes mellitus ([place]) - Continue SSI and CBG monitoring -Continue Lantus - A1c 10.2% on 01/31/2021  Right knee pain -Obtained X-Ray and showed "Medial joint space narrowing is noted with mild osteophytic change. Mild patellofemoral changes are noted as well. No joint effusion is seen. No acute fracture is noted."  Hyperkalemia-resolved as of 02/14/2021 - Noted earlier in  the admission and treated with [facility] - Follow BMP   Old records reviewed in assessment of this patient  Antimicrobials: Cefepime 01/31/21 x 1 Rocephin 01/31/21 x 1 Vanc 01/31/21 x 1 Ancef 02/01/21 >> current  DVT prophylaxis: enoxaparin (LOVENOX) injection 40 mg Start: 02/11/21 1000 Sequential compression device to OR Start: 02/10/21 1413 SCDs Start: 02/08/21 2116   Code Status:   Code Status: Full Code Family Communication:   Disposition Plan: Status is: Inpatient  Remains inpatient appropriate because:Ongoing diagnostic testing needed not appropriate for outpatient work up, IV treatments appropriate due to intensity of illness or inability to take PO, and Inpatient level of care appropriate due to severity of illness  Dispo: The patient is from: Home              Anticipated d/c is to:  Possibly CIR              Patient currently is not medically stable to d/c.   Difficult to place patient No  Risk of unplanned  readmission score: Unplanned Admission- Pilot do not use: 24.55   Objective: Blood pressure 115/80, pulse 97, temperature 97.8 F (36.6 C), temperature source Oral, resp. rate (!) 22, height _0  (1.778 m), weight 97.3 kg, SpO2 98 %.  Examination: General appearance: alert, cooperative, fatigued, and no distress Head: Normocephalic, without obvious abnormality, atraumatic Eyes:  EOMI; gross field of vision is intact Back:  Improved swelling Lungs: clear to auscultation bilaterally Chest wall:  Bilateral bandages noted over previously drained abscesses Heart: regular rate and rhythm, S1, S2 normal, and unable to appreciate any murmurs Abdomen: normal findings: bowel sounds normal and soft, non-tender Extremities: 1-2+ edema Skin: mobility and turgor normal Neurologic: Grossly normal  Consultants:  ID Orthopedic surgery General surgery  Procedures:  IR drainage of back fluid collection, 6/10  Data Reviewed: I have personally reviewed following labs and imaging studies Results for orders placed or performed during the hospital encounter of 01/31/21 (from the past 24 hour(s))  Glucose, capillary     Status: Abnormal   Collection Time: 02/14/21  4:42 PM  Result Value Ref Range   Glucose-Capillary 211 (H) 70 - 99 mg/dL  Glucose, capillary     Status: Abnormal   Collection Time: 02/14/21 10:06 PM  Result Value Ref Range   Glucose-Capillary 104 (H) 70 - 99 mg/dL  CBC with Differential/Platelet     Status: Abnormal   Collection Time: 02/15/21  5:58 AM  Result Value Ref Range   WBC 10.5 4.0 - 10.5 K/uL   RBC 2.52 (L) 4.22 - 5.81 MIL/uL   Hemoglobin 7.7 (L) 13.0 - 17.0 g/dL   HCT 24.1 (L) 39.0 - 52.0 %   MCV 95.6 80.0 - 100.0 fL   MCH 30.6 26.0 - 34.0 pg   MCHC 32.0 30.0 - 36.0 g/dL   RDW 14.9 11.5 - 15.5 %   Platelets 537 (H) 150 - 400 K/uL   nRBC 0.0 0.0 - 0.2 %   Neutrophils Relative % 74 %   Neutro Abs 7.9 (H) 1.7 - 7.7 K/uL   Lymphocytes Relative 16 %   Lymphs Abs 1.7  0.7 - 4.0 K/uL   Monocytes Relative 7 %   Monocytes Absolute 0.8 0.1 - 1.0 K/uL   Eosinophils Relative 1 %   Eosinophils Absolute 0.1 0.0 - 0.5 K/uL   Basophils Relative 1 %   Basophils Absolute 0.1 0.0 - 0.1 K/uL   Immature Granulocytes 1 %   Abs Immature Granulocytes 0.07  0.00 - 0.07 K/uL  Basic metabolic panel     Status: Abnormal   Collection Time: 02/15/21  5:58 AM  Result Value Ref Range   Sodium 131 (L) 135 - 145 mmol/L   Potassium 4.1 3.5 - 5.1 mmol/L   Chloride 93 (L) 98 - 111 mmol/L   CO2 30 22 - 32 mmol/L   Glucose, Bld 126 (H) 70 - 99 mg/dL   BUN 9 6 - 20 mg/dL   Creatinine, Ser 0.59 (L) 0.61 - 1.24 mg/dL   Calcium 8.3 (L) 8.9 - 10.3 mg/dL   GFR, Estimated >60 >60 mL/min   Anion gap 8 5 - 15  Magnesium     Status: None   Collection Time: 02/15/21  5:58 AM  Result Value Ref Range   Magnesium 1.9 1.7 - 2.4 mg/dL  Glucose, capillary     Status: Abnormal   Collection Time: 02/15/21  7:27 AM  Result Value Ref Range   Glucose-Capillary 122 (H) 70 - 99 mg/dL  Glucose, capillary     Status: Abnormal   Collection Time: 02/15/21 11:51 AM  Result Value Ref Range   Glucose-Capillary 114 (H) 70 - 99 mg/dL    Recent Results (from the past 240 hour(s))  Fungus Culture With Stain     Status: None (Preliminary result)   Collection Time: 02/08/21  6:48 PM   Specimen: Synovial, Left Shoulder; Body Fluid  Result Value Ref Range Status   Fungus Stain Final report  Final    Comment: (NOTE) Performed At: [facility] [address], [place] [id number] Rush Farmer MD [id number]    Fungus (Mycology) Culture PENDING  Incomplete   Fungal Source SHOULDER  Final    Comment: LEFT Performed at [facility], [place] [address], [place], [place] [address]   Aerobic/Anaerobic Culture w Gram Stain (surgical/deep wound)     Status: None   Collection Time: 02/08/21  6:48 PM   Specimen: Synovial, Left Shoulder; Body Fluid  Result Value Ref Range Status    Specimen Description   Final    SHOULDER LEFT Performed at [place] [address] [place], [place] [address]    Special Requests   Final    NONE Performed at [facility], [place] [address], [place], [place] [address]    Gram Stain   Final    ABUNDANT WBC PRESENT,BOTH PMN AND MONONUCLEAR RARE GRAM POSITIVE COCCI IN PAIRS    Culture   Final    RARE STAPHYLOCOCCUS AUREUS NO ANAEROBES ISOLATED Performed at [facility], [place] [address], [place], [place] [address]    Report Status 02/14/2021 FINAL  Final   Organism ID, Bacteria STAPHYLOCOCCUS AUREUS  Final      Susceptibility   Staphylococcus aureus - MIC*    CIPROFLOXACIN >=8 RESISTANT Resistant     ERYTHROMYCIN >=8 RESISTANT Resistant     GENTAMICIN <=0.5 SENSITIVE Sensitive     OXACILLIN 0.5 SENSITIVE Sensitive     TETRACYCLINE <=1 SENSITIVE Sensitive     VANCOMYCIN 1 SENSITIVE Sensitive     TRIMETH/SULFA <=10 SENSITIVE Sensitive     CLINDAMYCIN <=0.25 SENSITIVE Sensitive     RIFAMPIN <=0.5 SENSITIVE Sensitive     Inducible Clindamycin NEGATIVE Sensitive     * RARE STAPHYLOCOCCUS AUREUS  Fungus Culture Result     Status: None   Collection Time: 02/08/21  6:48 PM  Result Value Ref Range Status   Result 1 Comment  Final  Comment: (NOTE) KOH/Calcofluor preparation:  no fungus observed. Performed At: [facility] [place], [place] [id number] Rush Farmer MD [id number]   Fungus Culture With Stain     Status: None (Preliminary result)   Collection Time: 02/08/21  6:52 PM   Specimen: Joint, Other; Body Fluid  Result Value Ref Range Status   Fungus Stain Final report  Final    Comment: (NOTE) Performed At: [facility] [place], [place] [id number] Rush Farmer MD [id number]    Fungus (Mycology) Culture PENDING  Incomplete   Fungal Source SHOULDER  Final    Comment: LEFT BONE CHIPS Performed at [facility], [place] [address], [place], [place] [address]   Aerobic/Anaerobic Culture w Gram Stain (surgical/deep wound)     Status: None   Collection Time: 02/08/21  6:52 PM   Specimen: Joint, Other; Body Fluid  Result Value Ref Range Status   Specimen Description   Final    SHOULDER LEFT BONE CHIPS Performed at [place] [address], [place], [place] [address]    Special Requests   Final    NONE Performed at [facility], [place] [address], [place], [place] [address]    Gram Stain NO WBC SEEN NO ORGANISMS SEEN   Final   Culture   Final    RARE STAPHYLOCOCCUS AUREUS NO ANAEROBES ISOLATED Performed at [facility], [place] [address] [place], [place] [address]    Report Status 02/14/2021 FINAL  Final   Organism ID, Bacteria STAPHYLOCOCCUS AUREUS  Final      Susceptibility   Staphylococcus aureus - MIC*    CIPROFLOXACIN >=8 RESISTANT Resistant     ERYTHROMYCIN >=8 RESISTANT Resistant     GENTAMICIN <=0.5 SENSITIVE Sensitive     OXACILLIN 0.5 SENSITIVE Sensitive     TETRACYCLINE <=1 SENSITIVE Sensitive     VANCOMYCIN 1 SENSITIVE Sensitive     TRIMETH/SULFA <=10 SENSITIVE Sensitive     CLINDAMYCIN <=0.25 SENSITIVE Sensitive     RIFAMPIN <=0.5 SENSITIVE Sensitive     Inducible Clindamycin NEGATIVE Sensitive     * RARE STAPHYLOCOCCUS AUREUS  Fungus Culture Result     Status: None   Collection Time: 02/08/21  6:52 PM  Result Value Ref Range Status   Result 1 Comment  Final    Comment: (NOTE) KOH/Calcofluor preparation:  no fungus observed. Performed At: [facility] [place], [place] [id number] Rush Farmer MD [id number]   Aerobic/Anaerobic Culture w Gram Stain (surgical/deep wound)     Status: None (Preliminary result)   Collection Time: 02/10/21 10:22 AM   Specimen: Wound  Result Value Ref Range Status   Specimen Description   Final    WOUND CHEST Performed at [facility]  [address] [place], [place] [address]    Special Requests   Final    NONE Performed at [facility], [place] [address], [place], [place] [address]    Gram Stain   Final    ABUNDANT WBC PRESENT,BOTH PMN AND MONONUCLEAR MODERATE GRAM POSITIVE COCCI Performed at [facility], [place] [address], [place], [place] [address]    Culture   Final    ABUNDANT STAPHYLOCOCCUS AUREUS NO ANAEROBES ISOLATED; CULTURE IN PROGRESS FOR 5 DAYS    Report Status PENDING  Incomplete   Organism ID, Bacteria STAPHYLOCOCCUS AUREUS  Final      Susceptibility   Staphylococcus aureus - MIC*  CIPROFLOXACIN >=8 RESISTANT Resistant     ERYTHROMYCIN >=8 RESISTANT Resistant     GENTAMICIN <=0.5 SENSITIVE Sensitive     OXACILLIN 0.5 SENSITIVE Sensitive     TETRACYCLINE <=1 SENSITIVE Sensitive     VANCOMYCIN 1 SENSITIVE Sensitive     TRIMETH/SULFA <=10 SENSITIVE Sensitive     CLINDAMYCIN <=0.25 SENSITIVE Sensitive     RIFAMPIN <=0.5 SENSITIVE Sensitive     Inducible Clindamycin NEGATIVE Sensitive     * ABUNDANT STAPHYLOCOCCUS AUREUS  Culture, blood (single)     Status: None (Preliminary result)   Collection Time: 02/13/21  5:51 AM   Specimen: BLOOD  Result Value Ref Range Status   Specimen Description   Final    BLOOD RIGHT ANTECUBITAL Performed at [facility] [address] [place], [place] [address]    Special Requests   Final    BOTTLES DRAWN AEROBIC ONLY Blood Culture adequate volume Performed at [facility] [address] [place], [place] [address]    Culture   Final    NO GROWTH 2 DAYS Performed at [place] [address] [place], [place] [address]    Report Status PENDING  Incomplete  Culture, blood (single)     Status: None (Preliminary result)   Collection Time: 02/14/21  2:12 AM   Specimen: BLOOD  Result Value Ref Range Status   Specimen Description   Final    BLOOD RIGHT ANTECUBITAL Performed at [facility] [address], [place], [place] [address]    Special Requests   Final    BOTTLES DRAWN AEROBIC AND ANAEROBIC Blood Culture adequate volume Performed at [place] [address], [place], [place] [address]    Culture   Final    NO GROWTH 1 DAY Performed at [facility], [place] [address] [place],  [address]    Report Status PENDING  Incomplete     Radiology Studies: CT CHEST WO CONTRAST  Result Date: 02/13/2021 CLINICAL DATA:  History of shoulder infection. EXAM: CT CHEST WITHOUT CONTRAST TECHNIQUE: Multidetector CT imaging of the chest was performed following the standard protocol without IV contrast. COMPARISON:  CT chest 02/09/2021 FINDINGS: Cardiovascular: The heart size is normal. No substantial pericardial effusion. No thoracic aortic aneurysm. Mediastinum/Nodes: No mediastinal lymphadenopathy. No evidence for gross hilar lymphadenopathy although assessment is limited by the lack of intravenous contrast on today's study. The esophagus has normal imaging features. Bulky lymphadenopathy is again identified in the left axilla. Lungs/Pleura: 7 mm subpleural left upper lobe pulmonary nodule on 71/7 is similar to prior. 6 mm nodule in the medial left upper lobe (91/7) and 12 mm nodule in the posterior left upper lobe are similar to prior. Bibasilar collapse/consolidation is not substantially changed in the interval. Probable tiny bilateral effusions. Upper Abdomen: Unremarkable. Musculoskeletal: Soft tissue gas and fluid is identified in the left supraclavicular region, around the left shoulder, and in the lateral and posterior left chest wall. Fluid and gas in the supraclavicular region tracking posteriorly into the subscapular space is similar to prior. Diffuse edema noted in the muscles of the rotator cuff. Surgical drain noted posterolateral lower left thoracic wall with interval decrease in soft tissue gas and swelling in this region. There  is an open wound in the region of the cervical drain. Gas and fluid are seen in the region of the right shoulder joint, with more gas visible on today's study than on the previous exam. Diffuse edema  noted in the muscles of the rotator cuff. IMPRESSION: 1. Interval drain placement in the lateral and posterolateral left chest wall with decrease in the volume of gas and debris in the chest wall. 2. Similar gas in fluid in the left supraclavicular space and region of the left shoulder joint with diffuse edema of the rotator cuff muscles. 3. Gas is now visible in the soft tissues anterior to the right shoulder with similar appearance of fluid and edema in the soft tissues and muscles around the right shoulder. Electronically Signed   By: Misty Stanley M.D.   On: 02/13/2021 17:37   MR CERVICAL SPINE W WO CONTRAST  Result Date: 02/15/2021 CLINICAL DATA:  55 year old male with history of disseminated MSSA infection, evaluate for osteomyelitis discitis. EXAM: MRI CERVICAL, THORACIC AND LUMBAR SPINE WITHOUT AND WITH CONTRAST TECHNIQUE: Multiplanar and multiecho pulse sequences of the cervical spine, to include the craniocervical junction and cervicothoracic junction, and thoracic and lumbar spine, were obtained without and with intravenous contrast. CONTRAST:  32m GADAVIST GADOBUTROL 1 MMOL/ML IV SOLN COMPARISON:  Prior CT from 02/13/2021. FINDINGS: MRI CERVICAL SPINE FINDINGS Alignment: Examination somewhat technically limited as the patient was unable to complete the entirety of the exam. No postcontrast images of the cervical spine are provided. Mild straightening of the normal cervical lordosis.  No listhesis. Vertebrae: Vertebral body height maintained without acute or chronic fracture. Diffusely decreased T1 signal intensity seen throughout the visualized bone marrow, nonspecific, but most commonly related to anemia, smoking, or obesity. No discrete or worrisome osseous lesions. No findings to suggest  osteomyelitis discitis or septic arthritis within the cervical spine. Cord: Normal signal and morphology. No epidural abscess or other collection. Posterior Fossa, vertebral arteries, paraspinal tissues: Partially empty sella noted. Visualized brain and posterior fossa otherwise unremarkable. Craniocervical junction normal. Known fluid collections/abscess involving the left chest wall are partially visualized. Largest loculated component seen at the left upper posterior chest and measures 5.3 x 1.6 cm (series 34, image 34). Additional separate and partially visualized loculated component position slightly laterally measures 4.3 x 1.6 cm (series 34, image 3). Scattered foci of internal susceptibility artifact consistent with gas. No extension to involve the underlying spinal canal or vertebral column. Associated scattered ill-defined edema noted within the musculature of the visualized upper posterior chest. Prevertebral soft tissues are within normal limits. Normal flow voids seen within the vertebral arteries bilaterally. Disc levels: C2-C3: Mild uncovertebral hypertrophy without significant disc bulge. Right worse than left facet hypertrophy. No spinal stenosis. Mild to moderate right C3 foraminal narrowing. Left neural foramina remains patent. C3-C4: Left paracentral disc protrusion with slight inferior migration indents the left ventral thecal sac (series 33, image 10). Mild flattening of the left ventral cord without cord signal changes. Mild spinal stenosis. Superimposed uncovertebral and facet hypertrophy with resultant mild to moderate right worse than left C4 foraminal stenosis. C4-C5: Disc desiccation. Broad-based central disc protrusion indents the ventral thecal sac, eccentric to the left (series 33, image 15). Superimposed uncovertebral and facet hypertrophy. Resultant mild to moderate spinal stenosis. Severe right worse than left C5 foraminal narrowing. C5-C6: Small right paracentral disc protrusion  indents the ventral thecal sac (series 34, image 25). Mild spinal stenosis without cord deformity. Superimposed bilateral facet hypertrophy. Mild to moderate left worse than right C6 foraminal narrowing. C6-C7: Disc desiccation with diffuse disc bulge. Superimposed central disc extrusion with inferior migration. Extruded disc material contacts and mildly flattens the ventral cord. No cord signal changes. Moderate spinal stenosis. Superimposed facet and  uncovertebral hypertrophy with resultant severe right worse than left C7 foraminal stenosis. C7-T1: Small amount of extruded disc material extends from the C6-7 level above. No other disc pathology at this level. Right worse than left facet hypertrophy. No significant spinal stenosis. Mild right C8 foraminal narrowing. Left neural foramina remains patent. MRI THORACIC SPINE FINDINGS Alignment:  Examination technically limited by motion artifact. Normal alignment with preservation of the normal thoracic kyphosis. No listhesis. Vertebrae: Vertebral body height maintained without acute or chronic fracture. Bone marrow signal intensity diffusely decreased on T1 weighted imaging. 1.5 cm benign hemangioma noted within the T7 vertebral body. No worrisome osseous lesions. Abnormal marrow edema and enhancement seen involving the T3, T4, T5, and T6 vertebral bodies, suspicious for acute osteomyelitis. Probable involvement of the anterior aspects of the T10 and T11 vertebral bodies as well (series 37, image 4) no convincing evidence for intervening discitis at this time. Mild marrow edema seen about the right T3-4 and T4-5 facets, suspected to reflect concomitant septic arthritis (series 37, image 2). Cord: No convincing cord signal abnormality seen on this motion degraded exam. Thin enhancement seen involving the ventral epidural space at T3 through T6, likely related to mild infection/phlegmon (series 37, image 6). Possible scattered dorsal involvement noted as well. No frank  epidural abscess. Paraspinal and other soft tissues: Known left chest wall collections/abscesses are partially visualized, with largest component seen at the upper left posterior chest measuring 11.8 x 1.9 cm (series 38, image 4). Mild paraspinous phlegmon/enhancement seen adjacent to the infected vertebral bodies, most pronounced at T3-4 and T4-5 (series 39, image 10, 15) no frank paraspinous abscess or collection. Mildly irregular layering bilateral pleural effusions, left greater than right partially visualized. Atelectasis and/or infiltrates noted within the partially visualized lungs. Disc levels: T1-2: Negative interspace. Bilateral facet hypertrophy. No stenosis. T2-3: Facet hypertrophy. Probable right foraminal disc protrusion. No significant spinal stenosis. Mild bilateral foraminal narrowing. T3-4: Negative interspace. Bilateral facet hypertrophy with possible septic arthritis on the right. No significant stenosis. T4-5: Central/right paracentral disc protrusion indents the ventral thecal sac. Flattening of the right ventral cord. Posterior element hypertrophy. Mild spinal stenosis. Foramina remain patent. T5-6: Right paracentral disc protrusion indents the right ventral thecal sac, contacting and flattening the right cord. Moderate spinal stenosis. Foramina remain patent. T6-7: Central/left paracentral disc extrusion with superior migration contacts and flattens the ventral cord. Posterior element hypertrophy with prominence of the epidural fat. Mild to moderate spinal stenosis. Foramina remain patent. T7-8: Shallow left paracentral disc protrusion. Prominence of the dorsal epidural fat with mild posterior element hypertrophy. Mild spinal stenosis. Foramina remain patent. T8-9: Small central irregular disc protrusion. Mild posterior element hypertrophy. Mild spinal stenosis. Foramina remain patent. T9-10: Left paracentral to foraminal disc protrusion. Posterior element hypertrophy. Mild spinal stenosis.  Mild left neural foraminal narrowing. T10-11: Right paracentral disc protrusion with superior migration. Moderate facet hypertrophy. Mild flattening of the ventral cord with mild-to-moderate spinal stenosis. Moderate bilateral foraminal narrowing. T11-12: Right paracentral disc extrusion with superior migration. Secondary flattening of the right ventral cord. Moderate facet hypertrophy. Resultant moderate canal with bilateral foraminal stenosis. T12-L1: Right paracentral disc protrusion. Moderate facet hypertrophy. No significant stenosis. MRI LUMBAR SPINE FINDINGS Segmentation:  Standard. Alignment: 7 mm anterolisthesis of L5 on S1. Trace retrolisthesis of L1 on L2 and L2 on L3. Vertebrae: Sequelae of prior PLIF at L3 through S1. Vertebral body height maintained without acute or chronic fracture. Diffusely decreased T1 signal intensity within the underlying bone marrow. There is abnormal edema involving  the L2-3 disc, with marrow edema involving the adjacent L2 and L3 vertebral bodies, consistent with osteomyelitis discitis. Suspected additional involvement of the adjacent L1-2 disc as well. Conus medullaris and cauda equina: Conus extends to the L1 level. Conus and cauda equina appear normal. No visible significant epidural involvement or epidural abscess. Paraspinal and other soft tissues: Abnormal edema with multifocal abscesses seen involving the psoas muscles bilaterally. Largest of these seen on the right and measures 3.7 x 2.8 cm (series 28, image 30). Additional collection at the laminectomy site within the posterior soft tissues measures 2.9 x 2.1 x 5.9 cm, felt to be most consistent with a benign postoperative seroma. Additional chronic postoperative changes noted elsewhere within the posterior paraspinous soft tissues. No visible SI joint involvement. Disc levels: L1-2: Retrolisthesis. Intervertebral disc space narrowing with mild disc bulge. Moderate facet hypertrophy. No significant spinal stenosis.  Mild bilateral L1 foraminal narrowing. L2-3: Trace retrolisthesis. Mild disc bulge. Advanced facet and ligament flavum hypertrophy. Resultant moderate spinal stenosis. Moderate bilateral L2 foraminal narrowing. L3-4:  Prior PLIF.  No residual stenosis. L4-5:  Prior PLIF.  No residual stenosis. L5-S1: Chronic 7 mm anterolisthesis with prior PLIF. No significant residual spinal stenosis. Probable residual mild to moderate foraminal narrowing largely due to slippage. IMPRESSION: MRI CERVICAL SPINE IMPRESSION: 1. No evidence for osteomyelitis discitis within the cervical spine. No epidural abscess or other abnormality. 2. Partially visualized collections/abscesses involving the left upper chest wall, better seen on recent CT examinations. No extension to involve the underlying spinal canal or vertebral column. 3. Multilevel degenerative spondylosis with resultant mild to moderate spinal stenosis at C3-4 through C6-7 as above. Severe right worse than left C5 and C7 foraminal stenosis. MRI THORACIC SPINE IMPRESSION: 1. Findings consistent with acute osteomyelitis involving the T3, T4, T5, and T6 vertebral bodies, with probable involvement of the anterior aspect of the T10 and T11 vertebral bodies. No convincing evidence for intervening discitis at this time. 2. Mild marrow edema and enhancement involving the right T3-4 and T4-5 facets, consistent with concomitant septic arthritis. 3. Enhancement involving the ventral epidural space at T3 through T6, consistent with mild/early epidural involvement. No frank epidural abscess. 4. Partially visualized left chest wall abscesses/collections with bilateral pleural effusions. 5. Multilevel degenerative spondylosis with resultant mild to moderate diffuse spinal stenosis throughout the thoracic spine as detailed above. MRI LUMBAR SPINE IMPRESSION: 1. 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 No appreciable epidural involvement at this time. 2.  Multifocal abscesses involving the bilateral psoas musculature, largest of which on the right measures up to 3.7 cm. 3. Postoperative changes from prior PLIF at L3-4 through L5-S1 without residual spinal stenosis. Collection at the posterior laminectomy site felt to be most consistent with a benign postoperative seroma. 4. 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 Electronically Signed   By: Jeannine Boga M.D.   On: 02/15/2021 03:11   MR THORACIC SPINE W WO CONTRAST  Result Date: 02/15/2021 CLINICAL DATA:  55 year old male with history of disseminated MSSA infection, evaluate for osteomyelitis discitis. EXAM: MRI CERVICAL, THORACIC AND LUMBAR SPINE WITHOUT AND WITH CONTRAST TECHNIQUE: Multiplanar and multiecho pulse sequences of the cervical spine, to include the craniocervical junction and cervicothoracic junction, and thoracic and lumbar spine, were obtained without and with intravenous contrast. CONTRAST:  49m GADAVIST GADOBUTROL 1 MMOL/ML IV SOLN COMPARISON:  Prior CT from 02/13/2021. FINDINGS: MRI CERVICAL SPINE FINDINGS Alignment: Examination somewhat technically limited as the patient  was unable to complete the entirety of the exam. No postcontrast images of the cervical spine are provided. Mild straightening of the normal cervical lordosis.  No listhesis. Vertebrae: Vertebral body height maintained without acute or chronic fracture. Diffusely decreased T1 signal intensity seen throughout the visualized bone marrow, nonspecific, but most commonly related to anemia, smoking, or obesity. No discrete or worrisome osseous lesions. No findings to suggest osteomyelitis discitis or septic arthritis within the cervical spine. Cord: Normal signal and morphology. No epidural abscess or other collection. Posterior Fossa, vertebral arteries, paraspinal tissues: Partially empty sella noted. Visualized brain and posterior fossa otherwise unremarkable.  Craniocervical junction normal. Known fluid collections/abscess involving the left chest wall are partially visualized. Largest loculated component seen at the left upper posterior chest and measures 5.3 x 1.6 cm (series 34, image 34). Additional separate and partially visualized loculated component position slightly laterally measures 4.3 x 1.6 cm (series 34, image 3). Scattered foci of internal susceptibility artifact consistent with gas. No extension to involve the underlying spinal canal or vertebral column. Associated scattered ill-defined edema noted within the musculature of the visualized upper posterior chest. Prevertebral soft tissues are within normal limits. Normal flow voids seen within the vertebral arteries bilaterally. Disc levels: C2-C3: Mild uncovertebral hypertrophy without significant disc bulge. Right worse than left facet hypertrophy. No spinal stenosis. Mild to moderate right C3 foraminal narrowing. Left neural foramina remains patent. C3-C4: Left paracentral disc protrusion with slight inferior migration indents the left ventral thecal sac (series 33, image 10). Mild flattening of the left ventral cord without cord signal changes. Mild spinal stenosis. Superimposed uncovertebral and facet hypertrophy with resultant mild to moderate right worse than left C4 foraminal stenosis. C4-C5: Disc desiccation. Broad-based central disc protrusion indents the ventral thecal sac, eccentric to the left (series 33, image 15). Superimposed uncovertebral and facet hypertrophy. Resultant mild to moderate spinal stenosis. Severe right worse than left C5 foraminal narrowing. C5-C6: Small right paracentral disc protrusion indents the ventral thecal sac (series 34, image 25). Mild spinal stenosis without cord deformity. Superimposed bilateral facet hypertrophy. Mild to moderate left worse than right C6 foraminal narrowing. C6-C7: Disc desiccation with diffuse disc bulge. Superimposed central disc extrusion with  inferior migration. Extruded disc material contacts and mildly flattens the ventral cord. No cord signal changes. Moderate spinal stenosis. Superimposed facet and uncovertebral hypertrophy with resultant severe right worse than left C7 foraminal stenosis. C7-T1: Small amount of extruded disc material extends from the C6-7 level above. No other disc pathology at this level. Right worse than left facet hypertrophy. No significant spinal stenosis. Mild right C8 foraminal narrowing. Left neural foramina remains patent. MRI THORACIC SPINE FINDINGS Alignment:  Examination technically limited by motion artifact. Normal alignment with preservation of the normal thoracic kyphosis. No listhesis. Vertebrae: Vertebral body height maintained without acute or chronic fracture. Bone marrow signal intensity diffusely decreased on T1 weighted imaging. 1.5 cm benign hemangioma noted within the T7 vertebral body. No worrisome osseous lesions. Abnormal marrow edema and enhancement seen involving the T3, T4, T5, and T6 vertebral bodies, suspicious for acute osteomyelitis. Probable involvement of the anterior aspects of the T10 and T11 vertebral bodies as well (series 37, image 4) no convincing evidence for intervening discitis at this time. Mild marrow edema seen about the right T3-4 and T4-5 facets, suspected to reflect concomitant septic arthritis (series 37, image 2). Cord: No convincing cord signal abnormality seen on this motion degraded exam. Thin enhancement seen involving the ventral epidural space at T3 through  T6, likely related to mild infection/phlegmon (series 37, image 6). Possible scattered dorsal involvement noted as well. No frank epidural abscess. Paraspinal and other soft tissues: Known left chest wall collections/abscesses are partially visualized, with largest component seen at the upper left posterior chest measuring 11.8 x 1.9 cm (series 38, image 4). Mild paraspinous phlegmon/enhancement seen adjacent to the  infected vertebral bodies, most pronounced at T3-4 and T4-5 (series 39, image 10, 15) no frank paraspinous abscess or collection. Mildly irregular layering bilateral pleural effusions, left greater than right partially visualized. Atelectasis and/or infiltrates noted within the partially visualized lungs. Disc levels: T1-2: Negative interspace. Bilateral facet hypertrophy. No stenosis. T2-3: Facet hypertrophy. Probable right foraminal disc protrusion. No significant spinal stenosis. Mild bilateral foraminal narrowing. T3-4: Negative interspace. Bilateral facet hypertrophy with possible septic arthritis on the right. No significant stenosis. T4-5: Central/right paracentral disc protrusion indents the ventral thecal sac. Flattening of the right ventral cord. Posterior element hypertrophy. Mild spinal stenosis. Foramina remain patent. T5-6: Right paracentral disc protrusion indents the right ventral thecal sac, contacting and flattening the right cord. Moderate spinal stenosis. Foramina remain patent. T6-7: Central/left paracentral disc extrusion with superior migration contacts and flattens the ventral cord. Posterior element hypertrophy with prominence of the epidural fat. Mild to moderate spinal stenosis. Foramina remain patent. T7-8: Shallow left paracentral disc protrusion. Prominence of the dorsal epidural fat with mild posterior element hypertrophy. Mild spinal stenosis. Foramina remain patent. T8-9: Small central irregular disc protrusion. Mild posterior element hypertrophy. Mild spinal stenosis. Foramina remain patent. T9-10: Left paracentral to foraminal disc protrusion. Posterior element hypertrophy. Mild spinal stenosis. Mild left neural foraminal narrowing. T10-11: Right paracentral disc protrusion with superior migration. Moderate facet hypertrophy. Mild flattening of the ventral cord with mild-to-moderate spinal stenosis. Moderate bilateral foraminal narrowing. T11-12: Right paracentral disc extrusion  with superior migration. Secondary flattening of the right ventral cord. Moderate facet hypertrophy. Resultant moderate canal with bilateral foraminal stenosis. T12-L1: Right paracentral disc protrusion. Moderate facet hypertrophy. No significant stenosis. MRI LUMBAR SPINE FINDINGS Segmentation:  Standard. Alignment: 7 mm anterolisthesis of L5 on S1. Trace retrolisthesis of L1 on L2 and L2 on L3. Vertebrae: Sequelae of prior PLIF at L3 through S1. Vertebral body height maintained without acute or chronic fracture. Diffusely decreased T1 signal intensity within the underlying bone marrow. There is abnormal edema involving the L2-3 disc, with marrow edema involving the adjacent L2 and L3 vertebral bodies, consistent with osteomyelitis discitis. Suspected additional involvement of the adjacent L1-2 disc as well. Conus medullaris and cauda equina: Conus extends to the L1 level. Conus and cauda equina appear normal. No visible significant epidural involvement or epidural abscess. Paraspinal and other soft tissues: Abnormal edema with multifocal abscesses seen involving the psoas muscles bilaterally. Largest of these seen on the right and measures 3.7 x 2.8 cm (series 28, image 30). Additional collection at the laminectomy site within the posterior soft tissues measures 2.9 x 2.1 x 5.9 cm, felt to be most consistent with a benign postoperative seroma. Additional chronic postoperative changes noted elsewhere within the posterior paraspinous soft tissues. No visible SI joint involvement. Disc levels: L1-2: Retrolisthesis. Intervertebral disc space narrowing with mild disc bulge. Moderate facet hypertrophy. No significant spinal stenosis. Mild bilateral L1 foraminal narrowing. L2-3: Trace retrolisthesis. Mild disc bulge. Advanced facet and ligament flavum hypertrophy. Resultant moderate spinal stenosis. Moderate bilateral L2 foraminal narrowing. L3-4:  Prior PLIF.  No residual stenosis. L4-5:  Prior PLIF.  No residual  stenosis. L5-S1: Chronic 7 mm anterolisthesis with prior  PLIF. No significant residual spinal stenosis. Probable residual mild to moderate foraminal narrowing largely due to slippage. IMPRESSION: MRI CERVICAL SPINE IMPRESSION: 1. No evidence for osteomyelitis discitis within the cervical spine. No epidural abscess or other abnormality. 2. Partially visualized collections/abscesses involving the left upper chest wall, better seen on recent CT examinations. No extension to involve the underlying spinal canal or vertebral column. 3. Multilevel degenerative spondylosis with resultant mild to moderate spinal stenosis at C3-4 through C6-7 as above. Severe right worse than left C5 and C7 foraminal stenosis. MRI THORACIC SPINE IMPRESSION: 1. Findings consistent with acute osteomyelitis involving the T3, T4, T5, and T6 vertebral bodies, with probable involvement of the anterior aspect of the T10 and T11 vertebral bodies. No convincing evidence for intervening discitis at this time. 2. Mild marrow edema and enhancement involving the right T3-4 and T4-5 facets, consistent with concomitant septic arthritis. 3. Enhancement involving the ventral epidural space at T3 through T6, consistent with mild/early epidural involvement. No frank epidural abscess. 4. Partially visualized left chest wall abscesses/collections with bilateral pleural effusions. 5. Multilevel degenerative spondylosis with resultant mild to moderate diffuse spinal stenosis throughout the thoracic spine as detailed above. MRI LUMBAR SPINE IMPRESSION: 1. 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 No appreciable epidural involvement at this time. 2. Multifocal abscesses involving the bilateral psoas musculature, largest of which on the right measures up to 3.7 cm. 3. Postoperative changes from prior PLIF at L3-4 through L5-S1 without residual spinal stenosis. Collection at the posterior laminectomy site felt to be most consistent with  a benign postoperative seroma. 4. 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 Electronically Signed   By: Jeannine Boga M.D.   On: 02/15/2021 03:11   MR Lumbar Spine W Wo Contrast  Result Date: 02/15/2021 CLINICAL DATA:  55 year old male with history of disseminated MSSA infection, evaluate for osteomyelitis discitis. EXAM: MRI CERVICAL, THORACIC AND LUMBAR SPINE WITHOUT AND WITH CONTRAST TECHNIQUE: Multiplanar and multiecho pulse sequences of the cervical spine, to include the craniocervical junction and cervicothoracic junction, and thoracic and lumbar spine, were obtained without and with intravenous contrast. CONTRAST:  44m GADAVIST GADOBUTROL 1 MMOL/ML IV SOLN COMPARISON:  Prior CT from 02/13/2021. FINDINGS: MRI CERVICAL SPINE FINDINGS Alignment: Examination somewhat technically limited as the patient was unable to complete the entirety of the exam. No postcontrast images of the cervical spine are provided. Mild straightening of the normal cervical lordosis.  No listhesis. Vertebrae: Vertebral body height maintained without acute or chronic fracture. Diffusely decreased T1 signal intensity seen throughout the visualized bone marrow, nonspecific, but most commonly related to anemia, smoking, or obesity. No discrete or worrisome osseous lesions. No findings to suggest osteomyelitis discitis or septic arthritis within the cervical spine. Cord: Normal signal and morphology. No epidural abscess or other collection. Posterior Fossa, vertebral arteries, paraspinal tissues: Partially empty sella noted. Visualized brain and posterior fossa otherwise unremarkable. Craniocervical junction normal. Known fluid collections/abscess involving the left chest wall are partially visualized. Largest loculated component seen at the left upper posterior chest and measures 5.3 x 1.6 cm (series 34, image 34). Additional separate and partially visualized loculated  component position slightly laterally measures 4.3 x 1.6 cm (series 34, image 3). Scattered foci of internal susceptibility artifact consistent with gas. No extension to involve the underlying spinal canal or vertebral column. Associated scattered ill-defined edema noted within the musculature of the visualized upper posterior chest. Prevertebral soft tissues are within normal  limits. Normal flow voids seen within the vertebral arteries bilaterally. Disc levels: C2-C3: Mild uncovertebral hypertrophy without significant disc bulge. Right worse than left facet hypertrophy. No spinal stenosis. Mild to moderate right C3 foraminal narrowing. Left neural foramina remains patent. C3-C4: Left paracentral disc protrusion with slight inferior migration indents the left ventral thecal sac (series 33, image 10). Mild flattening of the left ventral cord without cord signal changes. Mild spinal stenosis. Superimposed uncovertebral and facet hypertrophy with resultant mild to moderate right worse than left C4 foraminal stenosis. C4-C5: Disc desiccation. Broad-based central disc protrusion indents the ventral thecal sac, eccentric to the left (series 33, image 15). Superimposed uncovertebral and facet hypertrophy. Resultant mild to moderate spinal stenosis. Severe right worse than left C5 foraminal narrowing. C5-C6: Small right paracentral disc protrusion indents the ventral thecal sac (series 34, image 25). Mild spinal stenosis without cord deformity. Superimposed bilateral facet hypertrophy. Mild to moderate left worse than right C6 foraminal narrowing. C6-C7: Disc desiccation with diffuse disc bulge. Superimposed central disc extrusion with inferior migration. Extruded disc material contacts and mildly flattens the ventral cord. No cord signal changes. Moderate spinal stenosis. Superimposed facet and uncovertebral hypertrophy with resultant severe right worse than left C7 foraminal stenosis. C7-T1: Small amount of extruded disc  material extends from the C6-7 level above. No other disc pathology at this level. Right worse than left facet hypertrophy. No significant spinal stenosis. Mild right C8 foraminal narrowing. Left neural foramina remains patent. MRI THORACIC SPINE FINDINGS Alignment:  Examination technically limited by motion artifact. Normal alignment with preservation of the normal thoracic kyphosis. No listhesis. Vertebrae: Vertebral body height maintained without acute or chronic fracture. Bone marrow signal intensity diffusely decreased on T1 weighted imaging. 1.5 cm benign hemangioma noted within the T7 vertebral body. No worrisome osseous lesions. Abnormal marrow edema and enhancement seen involving the T3, T4, T5, and T6 vertebral bodies, suspicious for acute osteomyelitis. Probable involvement of the anterior aspects of the T10 and T11 vertebral bodies as well (series 37, image 4) no convincing evidence for intervening discitis at this time. Mild marrow edema seen about the right T3-4 and T4-5 facets, suspected to reflect concomitant septic arthritis (series 37, image 2). Cord: No convincing cord signal abnormality seen on this motion degraded exam. Thin enhancement seen involving the ventral epidural space at T3 through T6, likely related to mild infection/phlegmon (series 37, image 6). Possible scattered dorsal involvement noted as well. No frank epidural abscess. Paraspinal and other soft tissues: Known left chest wall collections/abscesses are partially visualized, with largest component seen at the upper left posterior chest measuring 11.8 x 1.9 cm (series 38, image 4). Mild paraspinous phlegmon/enhancement seen adjacent to the infected vertebral bodies, most pronounced at T3-4 and T4-5 (series 39, image 10, 15) no frank paraspinous abscess or collection. Mildly irregular layering bilateral pleural effusions, left greater than right partially visualized. Atelectasis and/or infiltrates noted within the partially  visualized lungs. Disc levels: T1-2: Negative interspace. Bilateral facet hypertrophy. No stenosis. T2-3: Facet hypertrophy. Probable right foraminal disc protrusion. No significant spinal stenosis. Mild bilateral foraminal narrowing. T3-4: Negative interspace. Bilateral facet hypertrophy with possible septic arthritis on the right. No significant stenosis. T4-5: Central/right paracentral disc protrusion indents the ventral thecal sac. Flattening of the right ventral cord. Posterior element hypertrophy. Mild spinal stenosis. Foramina remain patent. T5-6: Right paracentral disc protrusion indents the right ventral thecal sac, contacting and flattening the right cord. Moderate spinal stenosis. Foramina remain patent. T6-7: Central/left paracentral disc extrusion with superior migration  contacts and flattens the ventral cord. Posterior element hypertrophy with prominence of the epidural fat. Mild to moderate spinal stenosis. Foramina remain patent. T7-8: Shallow left paracentral disc protrusion. Prominence of the dorsal epidural fat with mild posterior element hypertrophy. Mild spinal stenosis. Foramina remain patent. T8-9: Small central irregular disc protrusion. Mild posterior element hypertrophy. Mild spinal stenosis. Foramina remain patent. T9-10: Left paracentral to foraminal disc protrusion. Posterior element hypertrophy. Mild spinal stenosis. Mild left neural foraminal narrowing. T10-11: Right paracentral disc protrusion with superior migration. Moderate facet hypertrophy. Mild flattening of the ventral cord with mild-to-moderate spinal stenosis. Moderate bilateral foraminal narrowing. T11-12: Right paracentral disc extrusion with superior migration. Secondary flattening of the right ventral cord. Moderate facet hypertrophy. Resultant moderate canal with bilateral foraminal stenosis. T12-L1: Right paracentral disc protrusion. Moderate facet hypertrophy. No significant stenosis. MRI LUMBAR SPINE FINDINGS  Segmentation:  Standard. Alignment: 7 mm anterolisthesis of L5 on S1. Trace retrolisthesis of L1 on L2 and L2 on L3. Vertebrae: Sequelae of prior PLIF at L3 through S1. Vertebral body height maintained without acute or chronic fracture. Diffusely decreased T1 signal intensity within the underlying bone marrow. There is abnormal edema involving the L2-3 disc, with marrow edema involving the adjacent L2 and L3 vertebral bodies, consistent with osteomyelitis discitis. Suspected additional involvement of the adjacent L1-2 disc as well. Conus medullaris and cauda equina: Conus extends to the L1 level. Conus and cauda equina appear normal. No visible significant epidural involvement or epidural abscess. Paraspinal and other soft tissues: Abnormal edema with multifocal abscesses seen involving the psoas muscles bilaterally. Largest of these seen on the right and measures 3.7 x 2.8 cm (series 28, image 30). Additional collection at the laminectomy site within the posterior soft tissues measures 2.9 x 2.1 x 5.9 cm, felt to be most consistent with a benign postoperative seroma. Additional chronic postoperative changes noted elsewhere within the posterior paraspinous soft tissues. No visible SI joint involvement. Disc levels: L1-2: Retrolisthesis. Intervertebral disc space narrowing with mild disc bulge. Moderate facet hypertrophy. No significant spinal stenosis. Mild bilateral L1 foraminal narrowing. L2-3: Trace retrolisthesis. Mild disc bulge. Advanced facet and ligament flavum hypertrophy. Resultant moderate spinal stenosis. Moderate bilateral L2 foraminal narrowing. L3-4:  Prior PLIF.  No residual stenosis. L4-5:  Prior PLIF.  No residual stenosis. L5-S1: Chronic 7 mm anterolisthesis with prior PLIF. No significant residual spinal stenosis. Probable residual mild to moderate foraminal narrowing largely due to slippage. IMPRESSION: MRI CERVICAL SPINE IMPRESSION: 1. No evidence for osteomyelitis discitis within the cervical  spine. No epidural abscess or other abnormality. 2. Partially visualized collections/abscesses involving the left upper chest wall, better seen on recent CT examinations. No extension to involve the underlying spinal canal or vertebral column. 3. Multilevel degenerative spondylosis with resultant mild to moderate spinal stenosis at C3-4 through C6-7 as above. Severe right worse than left C5 and C7 foraminal stenosis. MRI THORACIC SPINE IMPRESSION: 1. Findings consistent with acute osteomyelitis involving the T3, T4, T5, and T6 vertebral bodies, with probable involvement of the anterior aspect of the T10 and T11 vertebral bodies. No convincing evidence for intervening discitis at this time. 2. Mild marrow edema and enhancement involving the right T3-4 and T4-5 facets, consistent with concomitant septic arthritis. 3. Enhancement involving the ventral epidural space at T3 through T6, consistent with mild/early epidural involvement. No frank epidural abscess. 4. Partially visualized left chest wall abscesses/collections with bilateral pleural effusions. 5. Multilevel degenerative spondylosis with resultant mild to moderate diffuse spinal stenosis throughout the thoracic spine as  detailed above. MRI LUMBAR SPINE IMPRESSION: 1. 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 No appreciable epidural involvement at this time. 2. Multifocal abscesses involving the bilateral psoas musculature, largest of which on the right measures up to 3.7 cm. 3. Postoperative changes from prior PLIF at L3-4 through L5-S1 without residual spinal stenosis. Collection at the posterior laminectomy site felt to be most consistent with a benign postoperative seroma. 4. 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 Electronically Signed   By: Jeannine Boga M.D.   On: 02/15/2021 03:11   CT SHOULDER LEFT WO CONTRAST  Result Date: 02/14/2021 CLINICAL DATA:   Septic arthritis suspected, shoulder, xray done septic arthritis left shoulder EXAM: CT OF THE UPPER LEFT EXTREMITY WITHOUT CONTRAST TECHNIQUE: Multidetector CT imaging of the upper left extremity was performed according to the standard protocol. COMPARISON:  CT 02/09/2021 FINDINGS: Bones/Joint/Cartilage There is no evidence of acute fracture. There is no new osseous erosion, destruction, or periosteal reaction. Persisting gas containing glenohumeral joint effusion and distention of the subacromial-subdeltoid bursa. Some of this gas/fluid is collecting in the subcoracoid recess. Ligaments Suboptimally assessed by CT. Muscles and Tendons There is persisting gas and fluid seen along the left upper chest wall/axilla, with most superior extent extending up the neck to the level of C6-C7 (axial image 3). There is persistent foci of intercostal gas (axial image 102/105). There is paraspinal muscle gas is well (axial image 82 and 51). Discrete abscesses are difficult to measure, with most well-defined area measuring 4.0 x 1.9 cm in the transverse plane by 7.5 cm in craniocaudal extent. Soft tissues As above. Left axillary lymphadenopathy. Small left pleural effusion. There is surgical material, likely packing along the left posterolateral soft tissues on axial image 112). IMPRESSION: Persistent, extensive abscess along the left chest wall/axilla, with visible gas/fluid extending superiorly in the neck to the level of C6/C7, and posteriorly to the paraspinal musculature. Most well-defined collection is within the axilla measuring 4.0 x 1.9 cm in the transverse plane and 7.5 cm in craniocaudal extent. Gas containing left shoulder joint effusion and distension of the subacromial-subdeltoid bursa, concerning for septic arthritis/bursitis, though these findings could in part be related to recent surgery. No new osseous abnormality to suggest osteomyelitis, though note CT is insensitive in detecting osteomyelitis. Electronically  Signed   By: Maurine Simmering   On: 02/14/2021 07:34   CT SHOULDER RIGHT WO CONTRAST  Result Date: 02/14/2021 CLINICAL DATA:  Septic arthritis suspected, shoulder, xray done septic arthritis right shoulder EXAM: CT OF THE UPPER RIGHT EXTREMITY WITHOUT CONTRAST TECHNIQUE: Multidetector CT imaging of the upper right extremity was performed according to the standard protocol. COMPARISON:  Right shoulder MRI 02/07/2021, CT 02/01/2021 FINDINGS: Bones/Joint/Cartilage Postsurgical changes of the right shoulder, including AC joint resection. There is soft tissue gas in fluid anteriorly, likely surgical related. There a small glenohumeral joint effusion and small amount of fluid in the subacromial-subdeltoid bursa. Ligaments Suboptimally assessed by CT. Muscles and Tendons Findings of full-thickness cuff tear. Soft tissues There is soft tissue swelling along the shoulder. In subcutaneous edema extending down the arm. IMPRESSION: Postsurgical changes at the right shoulder including resection at the [facility] joint. Soft tissue gas and fluid anteriorly, likely related to recent surgery. Small glenohumeral joint effusion with mild distention of the subacromial-subdeltoid bursa. There is soft tissue edema without no well-defined soft tissue abscess along the right shoulder. No new osseous changes to suggest osteomyelitis, though note CT  is insensitive in detecting osteomyelitis. Electronically Signed   By: Maurine Simmering   On: 02/14/2021 07:44   DG CHEST PORT 1 VIEW  Result Date: 02/13/2021 CLINICAL DATA:  Respiratory compromise EXAM: PORTABLE CHEST 1 VIEW COMPARISON:  02/10/2021 FINDINGS: Low lung volumes. Hazy atelectasis at the bases. Small left pleural effusion. Stable cardiomediastinal silhouette. No pneumothorax. IMPRESSION: Low lung volumes with small left effusion and hazy atelectasis at the bases Electronically Signed   By: Donavan Foil M.D.   On: 02/13/2021 23:28   MR CERVICAL SPINE W WO CONTRAST  Final Result    MR  Lumbar Spine W Wo Contrast  Final Result    MR THORACIC SPINE W WO CONTRAST  Final Result    DG CHEST PORT 1 VIEW  Final Result    CT SHOULDER LEFT WO CONTRAST  Final Result    CT SHOULDER RIGHT WO CONTRAST  Final Result    CT CHEST WO CONTRAST  Final Result    DG Chest Port 1 View  Final Result    CT SHOULDER LEFT WO CONTRAST  Final Result    CT CHEST WO CONTRAST  Final Result    DG Knee 1-2 Views Right  Final Result    MR Shoulder Right W Wo Contrast  Final Result    MR Shoulder Left W Wo Contrast  Final Result    MR BRAIN WO CONTRAST  Final Result    IR US Guide Bx Asp/Drain  Final Result    CT CHEST ABDOMEN PELVIS W CONTRAST  Final Result    DG Shoulder Left  Final Result    DG Shoulder Right  Final Result    MR THORACIC SPINE W WO CONTRAST  Final Result    DG Chest Port 1 View  Final Result    [place] IMAGE GUIDED FLUID DRAIN BY CATHETER    (Results Pending)  CT IMAGE GUIDED DRAINAGE BY PERCUTANEOUS CATHETER    (Results Pending)    Scheduled Meds:  Chlorhexidine Gluconate Cloth  6 each Topical Daily   enoxaparin (LOVENOX) injection  40 mg Subcutaneous Q24H   gabapentin  800 mg Oral TID   insulin aspart  0-15 Units Subcutaneous TID WC   insulin aspart  0-5 Units Subcutaneous QHS   insulin aspart  4 Units Subcutaneous TID WC   insulin glargine  15 Units Subcutaneous Daily   lidocaine-EPINEPHrine       mouth rinse  15 mL Mouth Rinse BID   midazolam       oxyCODONE  20 mg Oral Q12H   polyethylene glycol  17 g Oral Daily   povidone-iodine  2 application Topical Once   senna-docusate  1 tablet Oral BID   PRN Meds: sodium chloride, acetaminophen, alum & mag hydroxide-simeth, docusate sodium, fentaNYL, lidocaine-EPINEPHrine, lip balm, magic mouthwash w/lidocaine, methocarbamol, metoCLOPramide **OR** metoCLOPramide (REGLAN) injection, midazolam, morphine injection, ondansetron (ZOFRAN) IV, oxyCODONE, traZODone Continuous Infusions:  sodium  chloride Stopped (02/07/21 0508)    ceFAZolin (ANCEF) IV 2 g (02/15/21 0830)     LOS: 15 days  Time spent: Greater than 50% of the 35 minute visit was spent in counseling/coordination of care for the patient as laid out in the A&P.   Dwyane Dee, MD [facility] 02/15/2021, 3:14 PM

## 2021-02-15 NOTE — Procedures (Signed)
Pre procedural Dx: Paraspinal abscesses Post procedural Dx: Same  Technically successful CT guided placement of a 10 Fr drainage into the right paraspinal abscess yielding 35 cc of purulent fluid.  Sample sent to lab for analysis. Technically successful CT guided placement of a 10 fr drainage catheter into left paraspinal abscess yielding 5 cc of bloody fluid.      Both drains connected to JP bulbs.  EBL: Trace Complications: None immediate  Katherina Right, MD Pager #: [phone]

## 2021-02-15 NOTE — Progress Notes (Addendum)
5 Days Post-Op  Subjective: CC: RN reports they had lose of vac suction overnight but was able to regain seal. Febrile overnight to 102.9. WBC 10.5. Having back and shoulder pain. Had an MRI of C/T/L spine yesterday.   Objective: Vital signs in last 24 hours: Temp:  [97.9 F (36.6 C)-102.9 F (39.4 C)] 99.2 F (37.3 C) (06/23 0601) Pulse Rate:  [84-117] 91 (06/23 0601) Resp:  [16-20] 19 (06/23 0601) BP: (106-126)/(62-76) 113/73 (06/23 0601) SpO2:  [91 %-99 %] 94 % (06/23 0601) Last BM Date: 02/13/21  Intake/Output from previous day: 06/22 0701 - 06/23 0700 In: 1380 [P.O.:1080; IV Piggyback:300] Out: 2075 [Urine:2075] Intake/Output this shift: No intake/output data recorded.  PE: Gen:  Alert, NAD Heart: Tachycardic  Pulm:  Rate and effort normal Abd: Soft, ND, NT Skin: No appreciable erythema, induration, fluctuance of the mid/lower back or buttocks. I cannot appreciate psoas abscess seen on MRI with palpation on exam.  L Chest Wall: Vac in place with good seal. Periwound appears well without cellulitis. There is no induration or fluctuance of the chest wall. Purulent drainage in cannister  Lab Results:  Recent Labs    02/14/21 0212 02/15/21 0558  WBC 10.7* 10.5  HGB 7.6* 7.7*  HCT 23.2* 24.1*  PLT 598* 537*   BMET Recent Labs    02/14/21 0212 02/15/21 0558  NA 129* 131*  K 4.5 4.1  CL 94* 93*  CO2 28 30  GLUCOSE 157* 126*  BUN 7 9  CREATININE 0.51* 0.59*  CALCIUM 8.2* 8.3*   PT/INR No results for input(s): LABPROT, INR in the last 72 hours. CMP     Component Value Date/Time   NA 131 (L) 02/15/2021 0558   NA 137 10/09/2020 1155   K 4.1 02/15/2021 0558   CL 93 (L) 02/15/2021 0558   CO2 30 02/15/2021 0558   GLUCOSE 126 (H) 02/15/2021 0558   BUN 9 02/15/2021 0558   BUN 11 10/09/2020 1155   CREATININE 0.59 (L) 02/15/2021 0558   CALCIUM 8.3 (L) 02/15/2021 0558   PROT 5.2 (L) 02/14/2021 0212   PROT 7.6 10/09/2020 1155   ALBUMIN 1.8 (L)  02/14/2021 0212   ALBUMIN 4.7 10/09/2020 1155   AST 53 (H) 02/14/2021 0212   ALT 30 02/14/2021 0212   ALKPHOS 53 02/14/2021 0212   BILITOT 0.7 02/14/2021 0212   BILITOT 0.8 10/09/2020 1155   GFRNONAA >60 02/15/2021 0558   GFRAA 113 10/09/2020 1155   Lipase  No results found for: LIPASE     Studies/Results: CT CHEST WO CONTRAST  Result Date: 02/13/2021 CLINICAL DATA:  History of shoulder infection. EXAM: CT CHEST WITHOUT CONTRAST TECHNIQUE: Multidetector CT imaging of the chest was performed following the standard protocol without IV contrast. COMPARISON:  CT chest 02/09/2021 FINDINGS: Cardiovascular: The heart size is normal. No substantial pericardial effusion. No thoracic aortic aneurysm. Mediastinum/Nodes: No mediastinal lymphadenopathy. No evidence for gross hilar lymphadenopathy although assessment is limited by the lack of intravenous contrast on today's study. The esophagus has normal imaging features. Bulky lymphadenopathy is again identified in the left axilla. Lungs/Pleura: 7 mm subpleural left upper lobe pulmonary nodule on 71/7 is similar to prior. 6 mm nodule in the medial left upper lobe (91/7) and 12 mm nodule in the posterior left upper lobe are similar to prior. Bibasilar collapse/consolidation is not substantially changed in the interval. Probable tiny bilateral effusions. Upper Abdomen: Unremarkable. Musculoskeletal: Soft tissue gas and fluid is identified in the left supraclavicular region, around  the left shoulder, and in the lateral and posterior left chest wall. Fluid and gas in the supraclavicular region tracking posteriorly into the subscapular space is similar to prior. Diffuse edema noted in the muscles of the rotator cuff. Surgical drain noted posterolateral lower left thoracic wall with interval decrease in soft tissue gas and swelling in this region. There is an open wound in the region of the cervical drain. Gas and fluid are seen in the region of the right shoulder  joint, with more gas visible on today's study than on the previous exam. Diffuse edema noted in the muscles of the rotator cuff. IMPRESSION: 1. Interval drain placement in the lateral and posterolateral left chest wall with decrease in the volume of gas and debris in the chest wall. 2. Similar gas in fluid in the left supraclavicular space and region of the left shoulder joint with diffuse edema of the rotator cuff muscles. 3. Gas is now visible in the soft tissues anterior to the right shoulder with similar appearance of fluid and edema in the soft tissues and muscles around the right shoulder. Electronically Signed   By: Kennith Center M.D.   On: 02/13/2021 17:37   MR CERVICAL SPINE W WO CONTRAST  Result Date: 02/15/2021 CLINICAL DATA:  55 year old male with history of disseminated MSSA infection, evaluate for osteomyelitis discitis. EXAM: MRI CERVICAL, THORACIC AND LUMBAR SPINE WITHOUT AND WITH CONTRAST TECHNIQUE: Multiplanar and multiecho pulse sequences of the cervical spine, to include the craniocervical junction and cervicothoracic junction, and thoracic and lumbar spine, were obtained without and with intravenous contrast. CONTRAST:  10mL GADAVIST GADOBUTROL 1 MMOL/ML IV SOLN COMPARISON:  Prior CT from 02/13/2021. FINDINGS: MRI CERVICAL SPINE FINDINGS Alignment: Examination somewhat technically limited as the patient was unable to complete the entirety of the exam. No postcontrast images of the cervical spine are provided. Mild straightening of the normal cervical lordosis.  No listhesis. Vertebrae: Vertebral body height maintained without acute or chronic fracture. Diffusely decreased T1 signal intensity seen throughout the visualized bone marrow, nonspecific, but most commonly related to anemia, smoking, or obesity. No discrete or worrisome osseous lesions. No findings to suggest osteomyelitis discitis or septic arthritis within the cervical spine. Cord: Normal signal and morphology. No epidural abscess  or other collection. Posterior Fossa, vertebral arteries, paraspinal tissues: Partially empty sella noted. Visualized brain and posterior fossa otherwise unremarkable. Craniocervical junction normal. Known fluid collections/abscess involving the left chest wall are partially visualized. Largest loculated component seen at the left upper posterior chest and measures 5.3 x 1.6 cm (series 34, image 34). Additional separate and partially visualized loculated component position slightly laterally measures 4.3 x 1.6 cm (series 34, image 3). Scattered foci of internal susceptibility artifact consistent with gas. No extension to involve the underlying spinal canal or vertebral column. Associated scattered ill-defined edema noted within the musculature of the visualized upper posterior chest. Prevertebral soft tissues are within normal limits. Normal flow voids seen within the vertebral arteries bilaterally. Disc levels: C2-C3: Mild uncovertebral hypertrophy without significant disc bulge. Right worse than left facet hypertrophy. No spinal stenosis. Mild to moderate right C3 foraminal narrowing. Left neural foramina remains patent. C3-C4: Left paracentral disc protrusion with slight inferior migration indents the left ventral thecal sac (series 33, image 10). Mild flattening of the left ventral cord without cord signal changes. Mild spinal stenosis. Superimposed uncovertebral and facet hypertrophy with resultant mild to moderate right worse than left C4 foraminal stenosis. C4-C5: Disc desiccation. Broad-based central disc protrusion indents the ventral  thecal sac, eccentric to the left (series 33, image 15). Superimposed uncovertebral and facet hypertrophy. Resultant mild to moderate spinal stenosis. Severe right worse than left C5 foraminal narrowing. C5-C6: Small right paracentral disc protrusion indents the ventral thecal sac (series 34, image 25). Mild spinal stenosis without cord deformity. Superimposed bilateral facet  hypertrophy. Mild to moderate left worse than right C6 foraminal narrowing. C6-C7: Disc desiccation with diffuse disc bulge. Superimposed central disc extrusion with inferior migration. Extruded disc material contacts and mildly flattens the ventral cord. No cord signal changes. Moderate spinal stenosis. Superimposed facet and uncovertebral hypertrophy with resultant severe right worse than left C7 foraminal stenosis. C7-T1: Small amount of extruded disc material extends from the C6-7 level above. No other disc pathology at this level. Right worse than left facet hypertrophy. No significant spinal stenosis. Mild right C8 foraminal narrowing. Left neural foramina remains patent. MRI THORACIC SPINE FINDINGS Alignment:  Examination technically limited by motion artifact. Normal alignment with preservation of the normal thoracic kyphosis. No listhesis. Vertebrae: Vertebral body height maintained without acute or chronic fracture. Bone marrow signal intensity diffusely decreased on T1 weighted imaging. 1.5 cm benign hemangioma noted within the T7 vertebral body. No worrisome osseous lesions. Abnormal marrow edema and enhancement seen involving the T3, T4, T5, and T6 vertebral bodies, suspicious for acute osteomyelitis. Probable involvement of the anterior aspects of the T10 and T11 vertebral bodies as well (series 37, image 4) no convincing evidence for intervening discitis at this time. Mild marrow edema seen about the right T3-4 and T4-5 facets, suspected to reflect concomitant septic arthritis (series 37, image 2). Cord: No convincing cord signal abnormality seen on this motion degraded exam. Thin enhancement seen involving the ventral epidural space at T3 through T6, likely related to mild infection/phlegmon (series 37, image 6). Possible scattered dorsal involvement noted as well. No frank epidural abscess. Paraspinal and other soft tissues: Known left chest wall collections/abscesses are partially visualized, with  largest component seen at the upper left posterior chest measuring 11.8 x 1.9 cm (series 38, image 4). Mild paraspinous phlegmon/enhancement seen adjacent to the infected vertebral bodies, most pronounced at T3-4 and T4-5 (series 39, image 10, 15) no frank paraspinous abscess or collection. Mildly irregular layering bilateral pleural effusions, left greater than right partially visualized. Atelectasis and/or infiltrates noted within the partially visualized lungs. Disc levels: T1-2: Negative interspace. Bilateral facet hypertrophy. No stenosis. T2-3: Facet hypertrophy. Probable right foraminal disc protrusion. No significant spinal stenosis. Mild bilateral foraminal narrowing. T3-4: Negative interspace. Bilateral facet hypertrophy with possible septic arthritis on the right. No significant stenosis. T4-5: Central/right paracentral disc protrusion indents the ventral thecal sac. Flattening of the right ventral cord. Posterior element hypertrophy. Mild spinal stenosis. Foramina remain patent. T5-6: Right paracentral disc protrusion indents the right ventral thecal sac, contacting and flattening the right cord. Moderate spinal stenosis. Foramina remain patent. T6-7: Central/left paracentral disc extrusion with superior migration contacts and flattens the ventral cord. Posterior element hypertrophy with prominence of the epidural fat. Mild to moderate spinal stenosis. Foramina remain patent. T7-8: Shallow left paracentral disc protrusion. Prominence of the dorsal epidural fat with mild posterior element hypertrophy. Mild spinal stenosis. Foramina remain patent. T8-9: Small central irregular disc protrusion. Mild posterior element hypertrophy. Mild spinal stenosis. Foramina remain patent. T9-10: Left paracentral to foraminal disc protrusion. Posterior element hypertrophy. Mild spinal stenosis. Mild left neural foraminal narrowing. T10-11: Right paracentral disc protrusion with superior migration. Moderate facet  hypertrophy. Mild flattening of the ventral cord with mild-to-moderate spinal  stenosis. Moderate bilateral foraminal narrowing. T11-12: Right paracentral disc extrusion with superior migration. Secondary flattening of the right ventral cord. Moderate facet hypertrophy. Resultant moderate canal with bilateral foraminal stenosis. T12-L1: Right paracentral disc protrusion. Moderate facet hypertrophy. No significant stenosis. MRI LUMBAR SPINE FINDINGS Segmentation:  Standard. Alignment: 7 mm anterolisthesis of L5 on S1. Trace retrolisthesis of L1 on L2 and L2 on L3. Vertebrae: Sequelae of prior PLIF at L3 through S1. Vertebral body height maintained without acute or chronic fracture. Diffusely decreased T1 signal intensity within the underlying bone marrow. There is abnormal edema involving the L2-3 disc, with marrow edema involving the adjacent L2 and L3 vertebral bodies, consistent with osteomyelitis discitis. Suspected additional involvement of the adjacent L1-2 disc as well. Conus medullaris and cauda equina: Conus extends to the L1 level. Conus and cauda equina appear normal. No visible significant epidural involvement or epidural abscess. Paraspinal and other soft tissues: Abnormal edema with multifocal abscesses seen involving the psoas muscles bilaterally. Largest of these seen on the right and measures 3.7 x 2.8 cm (series 28, image 30). Additional collection at the laminectomy site within the posterior soft tissues measures 2.9 x 2.1 x 5.9 cm, felt to be most consistent with a benign postoperative seroma. Additional chronic postoperative changes noted elsewhere within the posterior paraspinous soft tissues. No visible SI joint involvement. Disc levels: L1-2: Retrolisthesis. Intervertebral disc space narrowing with mild disc bulge. Moderate facet hypertrophy. No significant spinal stenosis. Mild bilateral L1 foraminal narrowing. L2-3: Trace retrolisthesis. Mild disc bulge. Advanced facet and ligament flavum  hypertrophy. Resultant moderate spinal stenosis. Moderate bilateral L2 foraminal narrowing. L3-4:  Prior PLIF.  No residual stenosis. L4-5:  Prior PLIF.  No residual stenosis. L5-S1: Chronic 7 mm anterolisthesis with prior PLIF. No significant residual spinal stenosis. Probable residual mild to moderate foraminal narrowing largely due to slippage. IMPRESSION: MRI CERVICAL SPINE IMPRESSION: 1. No evidence for osteomyelitis discitis within the cervical spine. No epidural abscess or other abnormality. 2. Partially visualized collections/abscesses involving the left upper chest wall, better seen on recent CT examinations. No extension to involve the underlying spinal canal or vertebral column. 3. Multilevel degenerative spondylosis with resultant mild to moderate spinal stenosis at C3-4 through C6-7 as above. Severe right worse than left C5 and C7 foraminal stenosis. MRI THORACIC SPINE IMPRESSION: 1. Findings consistent with acute osteomyelitis involving the T3, T4, T5, and T6 vertebral bodies, with probable involvement of the anterior aspect of the T10 and T11 vertebral bodies. No convincing evidence for intervening discitis at this time. 2. Mild marrow edema and enhancement involving the right T3-4 and T4-5 facets, consistent with concomitant septic arthritis. 3. Enhancement involving the ventral epidural space at T3 through T6, consistent with mild/early epidural involvement. No frank epidural abscess. 4. Partially visualized left chest wall abscesses/collections with bilateral pleural effusions. 5. Multilevel degenerative spondylosis with resultant mild to moderate diffuse spinal stenosis throughout the thoracic spine as detailed above. MRI LUMBAR SPINE IMPRESSION: 1. 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 No appreciable epidural involvement at this time. 2. Multifocal abscesses involving the bilateral psoas musculature, largest of which on the right measures up to 3.7 cm. 3.  Postoperative changes from prior PLIF at L3-4 through L5-S1 without residual spinal stenosis. Collection at the posterior laminectomy site felt to be most consistent with a benign postoperative seroma. 4. 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 Electronically Signed   By: Janell Quiet.D.  On: 02/15/2021 03:11   MR THORACIC SPINE W WO CONTRAST  Result Date: 02/15/2021 CLINICAL DATA:  55 year old male with history of disseminated MSSA infection, evaluate for osteomyelitis discitis. EXAM: MRI CERVICAL, THORACIC AND LUMBAR SPINE WITHOUT AND WITH CONTRAST TECHNIQUE: Multiplanar and multiecho pulse sequences of the cervical spine, to include the craniocervical junction and cervicothoracic junction, and thoracic and lumbar spine, were obtained without and with intravenous contrast. CONTRAST:  10mL GADAVIST GADOBUTROL 1 MMOL/ML IV SOLN COMPARISON:  Prior CT from 02/13/2021. FINDINGS: MRI CERVICAL SPINE FINDINGS Alignment: Examination somewhat technically limited as the patient was unable to complete the entirety of the exam. No postcontrast images of the cervical spine are provided. Mild straightening of the normal cervical lordosis.  No listhesis. Vertebrae: Vertebral body height maintained without acute or chronic fracture. Diffusely decreased T1 signal intensity seen throughout the visualized bone marrow, nonspecific, but most commonly related to anemia, smoking, or obesity. No discrete or worrisome osseous lesions. No findings to suggest osteomyelitis discitis or septic arthritis within the cervical spine. Cord: Normal signal and morphology. No epidural abscess or other collection. Posterior Fossa, vertebral arteries, paraspinal tissues: Partially empty sella noted. Visualized brain and posterior fossa otherwise unremarkable. Craniocervical junction normal. Known fluid collections/abscess involving the left chest wall are partially visualized.  Largest loculated component seen at the left upper posterior chest and measures 5.3 x 1.6 cm (series 34, image 34). Additional separate and partially visualized loculated component position slightly laterally measures 4.3 x 1.6 cm (series 34, image 3). Scattered foci of internal susceptibility artifact consistent with gas. No extension to involve the underlying spinal canal or vertebral column. Associated scattered ill-defined edema noted within the musculature of the visualized upper posterior chest. Prevertebral soft tissues are within normal limits. Normal flow voids seen within the vertebral arteries bilaterally. Disc levels: C2-C3: Mild uncovertebral hypertrophy without significant disc bulge. Right worse than left facet hypertrophy. No spinal stenosis. Mild to moderate right C3 foraminal narrowing. Left neural foramina remains patent. C3-C4: Left paracentral disc protrusion with slight inferior migration indents the left ventral thecal sac (series 33, image 10). Mild flattening of the left ventral cord without cord signal changes. Mild spinal stenosis. Superimposed uncovertebral and facet hypertrophy with resultant mild to moderate right worse than left C4 foraminal stenosis. C4-C5: Disc desiccation. Broad-based central disc protrusion indents the ventral thecal sac, eccentric to the left (series 33, image 15). Superimposed uncovertebral and facet hypertrophy. Resultant mild to moderate spinal stenosis. Severe right worse than left C5 foraminal narrowing. C5-C6: Small right paracentral disc protrusion indents the ventral thecal sac (series 34, image 25). Mild spinal stenosis without cord deformity. Superimposed bilateral facet hypertrophy. Mild to moderate left worse than right C6 foraminal narrowing. C6-C7: Disc desiccation with diffuse disc bulge. Superimposed central disc extrusion with inferior migration. Extruded disc material contacts and mildly flattens the ventral cord. No cord signal changes. Moderate  spinal stenosis. Superimposed facet and uncovertebral hypertrophy with resultant severe right worse than left C7 foraminal stenosis. C7-T1: Small amount of extruded disc material extends from the C6-7 level above. No other disc pathology at this level. Right worse than left facet hypertrophy. No significant spinal stenosis. Mild right C8 foraminal narrowing. Left neural foramina remains patent. MRI THORACIC SPINE FINDINGS Alignment:  Examination technically limited by motion artifact. Normal alignment with preservation of the normal thoracic kyphosis. No listhesis. Vertebrae: Vertebral body height maintained without acute or chronic fracture. Bone marrow signal intensity diffusely decreased on T1 weighted imaging. 1.5 cm benign hemangioma noted within  the T7 vertebral body. No worrisome osseous lesions. Abnormal marrow edema and enhancement seen involving the T3, T4, T5, and T6 vertebral bodies, suspicious for acute osteomyelitis. Probable involvement of the anterior aspects of the T10 and T11 vertebral bodies as well (series 37, image 4) no convincing evidence for intervening discitis at this time. Mild marrow edema seen about the right T3-4 and T4-5 facets, suspected to reflect concomitant septic arthritis (series 37, image 2). Cord: No convincing cord signal abnormality seen on this motion degraded exam. Thin enhancement seen involving the ventral epidural space at T3 through T6, likely related to mild infection/phlegmon (series 37, image 6). Possible scattered dorsal involvement noted as well. No frank epidural abscess. Paraspinal and other soft tissues: Known left chest wall collections/abscesses are partially visualized, with largest component seen at the upper left posterior chest measuring 11.8 x 1.9 cm (series 38, image 4). Mild paraspinous phlegmon/enhancement seen adjacent to the infected vertebral bodies, most pronounced at T3-4 and T4-5 (series 39, image 10, 15) no frank paraspinous abscess or  collection. Mildly irregular layering bilateral pleural effusions, left greater than right partially visualized. Atelectasis and/or infiltrates noted within the partially visualized lungs. Disc levels: T1-2: Negative interspace. Bilateral facet hypertrophy. No stenosis. T2-3: Facet hypertrophy. Probable right foraminal disc protrusion. No significant spinal stenosis. Mild bilateral foraminal narrowing. T3-4: Negative interspace. Bilateral facet hypertrophy with possible septic arthritis on the right. No significant stenosis. T4-5: Central/right paracentral disc protrusion indents the ventral thecal sac. Flattening of the right ventral cord. Posterior element hypertrophy. Mild spinal stenosis. Foramina remain patent. T5-6: Right paracentral disc protrusion indents the right ventral thecal sac, contacting and flattening the right cord. Moderate spinal stenosis. Foramina remain patent. T6-7: Central/left paracentral disc extrusion with superior migration contacts and flattens the ventral cord. Posterior element hypertrophy with prominence of the epidural fat. Mild to moderate spinal stenosis. Foramina remain patent. T7-8: Shallow left paracentral disc protrusion. Prominence of the dorsal epidural fat with mild posterior element hypertrophy. Mild spinal stenosis. Foramina remain patent. T8-9: Small central irregular disc protrusion. Mild posterior element hypertrophy. Mild spinal stenosis. Foramina remain patent. T9-10: Left paracentral to foraminal disc protrusion. Posterior element hypertrophy. Mild spinal stenosis. Mild left neural foraminal narrowing. T10-11: Right paracentral disc protrusion with superior migration. Moderate facet hypertrophy. Mild flattening of the ventral cord with mild-to-moderate spinal stenosis. Moderate bilateral foraminal narrowing. T11-12: Right paracentral disc extrusion with superior migration. Secondary flattening of the right ventral cord. Moderate facet hypertrophy. Resultant moderate  canal with bilateral foraminal stenosis. T12-L1: Right paracentral disc protrusion. Moderate facet hypertrophy. No significant stenosis. MRI LUMBAR SPINE FINDINGS Segmentation:  Standard. Alignment: 7 mm anterolisthesis of L5 on S1. Trace retrolisthesis of L1 on L2 and L2 on L3. Vertebrae: Sequelae of prior PLIF at L3 through S1. Vertebral body height maintained without acute or chronic fracture. Diffusely decreased T1 signal intensity within the underlying bone marrow. There is abnormal edema involving the L2-3 disc, with marrow edema involving the adjacent L2 and L3 vertebral bodies, consistent with osteomyelitis discitis. Suspected additional involvement of the adjacent L1-2 disc as well. Conus medullaris and cauda equina: Conus extends to the L1 level. Conus and cauda equina appear normal. No visible significant epidural involvement or epidural abscess. Paraspinal and other soft tissues: Abnormal edema with multifocal abscesses seen involving the psoas muscles bilaterally. Largest of these seen on the right and measures 3.7 x 2.8 cm (series 28, image 30). Additional collection at the laminectomy site within the posterior soft tissues measures 2.9 x 2.1  x 5.9 cm, felt to be most consistent with a benign postoperative seroma. Additional chronic postoperative changes noted elsewhere within the posterior paraspinous soft tissues. No visible SI joint involvement. Disc levels: L1-2: Retrolisthesis. Intervertebral disc space narrowing with mild disc bulge. Moderate facet hypertrophy. No significant spinal stenosis. Mild bilateral L1 foraminal narrowing. L2-3: Trace retrolisthesis. Mild disc bulge. Advanced facet and ligament flavum hypertrophy. Resultant moderate spinal stenosis. Moderate bilateral L2 foraminal narrowing. L3-4:  Prior PLIF.  No residual stenosis. L4-5:  Prior PLIF.  No residual stenosis. L5-S1: Chronic 7 mm anterolisthesis with prior PLIF. No significant residual spinal stenosis. Probable residual mild  to moderate foraminal narrowing largely due to slippage. IMPRESSION: MRI CERVICAL SPINE IMPRESSION: 1. No evidence for osteomyelitis discitis within the cervical spine. No epidural abscess or other abnormality. 2. Partially visualized collections/abscesses involving the left upper chest wall, better seen on recent CT examinations. No extension to involve the underlying spinal canal or vertebral column. 3. Multilevel degenerative spondylosis with resultant mild to moderate spinal stenosis at C3-4 through C6-7 as above. Severe right worse than left C5 and C7 foraminal stenosis. MRI THORACIC SPINE IMPRESSION: 1. Findings consistent with acute osteomyelitis involving the T3, T4, T5, and T6 vertebral bodies, with probable involvement of the anterior aspect of the T10 and T11 vertebral bodies. No convincing evidence for intervening discitis at this time. 2. Mild marrow edema and enhancement involving the right T3-4 and T4-5 facets, consistent with concomitant septic arthritis. 3. Enhancement involving the ventral epidural space at T3 through T6, consistent with mild/early epidural involvement. No frank epidural abscess. 4. Partially visualized left chest wall abscesses/collections with bilateral pleural effusions. 5. Multilevel degenerative spondylosis with resultant mild to moderate diffuse spinal stenosis throughout the thoracic spine as detailed above. MRI LUMBAR SPINE IMPRESSION: 1. 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 No appreciable epidural involvement at this time. 2. Multifocal abscesses involving the bilateral psoas musculature, largest of which on the right measures up to 3.7 cm. 3. Postoperative changes from prior PLIF at L3-4 through L5-S1 without residual spinal stenosis. Collection at the posterior laminectomy site felt to be most consistent with a benign postoperative seroma. 4. Adjacent segment disease at L2-3 with resultant moderate canal and bilateral L2 foraminal  stenosis. Electronically Signed   By: Rise Mu M.D.   On: 02/15/2021 03:11   MR Lumbar Spine W Wo Contrast  Result Date: 02/15/2021 CLINICAL DATA:  55 year old male with history of disseminated MSSA infection, evaluate for osteomyelitis discitis. EXAM: MRI CERVICAL, THORACIC AND LUMBAR SPINE WITHOUT AND WITH CONTRAST TECHNIQUE: Multiplanar and multiecho pulse sequences of the cervical spine, to include the craniocervical junction and cervicothoracic junction, and thoracic and lumbar spine, were obtained without and with intravenous contrast. CONTRAST:  36mL GADAVIST GADOBUTROL 1 MMOL/ML IV SOLN COMPARISON:  Prior CT from 02/13/2021. FINDINGS: MRI CERVICAL SPINE FINDINGS Alignment: Examination somewhat technically limited as the patient was unable to complete the entirety of the exam. No postcontrast images of the cervical spine are provided. Mild straightening of the normal cervical lordosis.  No listhesis. Vertebrae: Vertebral body height maintained without acute or chronic fracture. Diffusely decreased T1 signal intensity seen throughout the visualized bone marrow, nonspecific, but most commonly related to anemia, smoking, or obesity. No discrete or worrisome osseous lesions. No findings to suggest osteomyelitis discitis or septic arthritis within the cervical spine. Cord: Normal signal and morphology. No epidural abscess or other collection. Posterior Fossa, vertebral arteries, paraspinal tissues: Partially empty sella noted. Visualized brain  and posterior fossa otherwise unremarkable. Craniocervical junction normal. Known fluid collections/abscess involving the left chest wall are partially visualized. Largest loculated component seen at the left upper posterior chest and measures 5.3 x 1.6 cm (series 34, image 34). Additional separate and partially visualized loculated component position slightly laterally measures 4.3 x 1.6 cm (series 34, image 3). Scattered foci of internal susceptibility  artifact consistent with gas. No extension to involve the underlying spinal canal or vertebral column. Associated scattered ill-defined edema noted within the musculature of the visualized upper posterior chest. Prevertebral soft tissues are within normal limits. Normal flow voids seen within the vertebral arteries bilaterally. Disc levels: C2-C3: Mild uncovertebral hypertrophy without significant disc bulge. Right worse than left facet hypertrophy. No spinal stenosis. Mild to moderate right C3 foraminal narrowing. Left neural foramina remains patent. C3-C4: Left paracentral disc protrusion with slight inferior migration indents the left ventral thecal sac (series 33, image 10). Mild flattening of the left ventral cord without cord signal changes. Mild spinal stenosis. Superimposed uncovertebral and facet hypertrophy with resultant mild to moderate right worse than left C4 foraminal stenosis. C4-C5: Disc desiccation. Broad-based central disc protrusion indents the ventral thecal sac, eccentric to the left (series 33, image 15). Superimposed uncovertebral and facet hypertrophy. Resultant mild to moderate spinal stenosis. Severe right worse than left C5 foraminal narrowing. C5-C6: Small right paracentral disc protrusion indents the ventral thecal sac (series 34, image 25). Mild spinal stenosis without cord deformity. Superimposed bilateral facet hypertrophy. Mild to moderate left worse than right C6 foraminal narrowing. C6-C7: Disc desiccation with diffuse disc bulge. Superimposed central disc extrusion with inferior migration. Extruded disc material contacts and mildly flattens the ventral cord. No cord signal changes. Moderate spinal stenosis. Superimposed facet and uncovertebral hypertrophy with resultant severe right worse than left C7 foraminal stenosis. C7-T1: Small amount of extruded disc material extends from the C6-7 level above. No other disc pathology at this level. Right worse than left facet hypertrophy.  No significant spinal stenosis. Mild right C8 foraminal narrowing. Left neural foramina remains patent. MRI THORACIC SPINE FINDINGS Alignment:  Examination technically limited by motion artifact. Normal alignment with preservation of the normal thoracic kyphosis. No listhesis. Vertebrae: Vertebral body height maintained without acute or chronic fracture. Bone marrow signal intensity diffusely decreased on T1 weighted imaging. 1.5 cm benign hemangioma noted within the T7 vertebral body. No worrisome osseous lesions. Abnormal marrow edema and enhancement seen involving the T3, T4, T5, and T6 vertebral bodies, suspicious for acute osteomyelitis. Probable involvement of the anterior aspects of the T10 and T11 vertebral bodies as well (series 37, image 4) no convincing evidence for intervening discitis at this time. Mild marrow edema seen about the right T3-4 and T4-5 facets, suspected to reflect concomitant septic arthritis (series 37, image 2). Cord: No convincing cord signal abnormality seen on this motion degraded exam. Thin enhancement seen involving the ventral epidural space at T3 through T6, likely related to mild infection/phlegmon (series 37, image 6). Possible scattered dorsal involvement noted as well. No frank epidural abscess. Paraspinal and other soft tissues: Known left chest wall collections/abscesses are partially visualized, with largest component seen at the upper left posterior chest measuring 11.8 x 1.9 cm (series 38, image 4). Mild paraspinous phlegmon/enhancement seen adjacent to the infected vertebral bodies, most pronounced at T3-4 and T4-5 (series 39, image 10, 15) no frank paraspinous abscess or collection. Mildly irregular layering bilateral pleural effusions, left greater than right partially visualized. Atelectasis and/or infiltrates noted within the partially visualized lungs.  Disc levels: T1-2: Negative interspace. Bilateral facet hypertrophy. No stenosis. T2-3: Facet hypertrophy.  Probable right foraminal disc protrusion. No significant spinal stenosis. Mild bilateral foraminal narrowing. T3-4: Negative interspace. Bilateral facet hypertrophy with possible septic arthritis on the right. No significant stenosis. T4-5: Central/right paracentral disc protrusion indents the ventral thecal sac. Flattening of the right ventral cord. Posterior element hypertrophy. Mild spinal stenosis. Foramina remain patent. T5-6: Right paracentral disc protrusion indents the right ventral thecal sac, contacting and flattening the right cord. Moderate spinal stenosis. Foramina remain patent. T6-7: Central/left paracentral disc extrusion with superior migration contacts and flattens the ventral cord. Posterior element hypertrophy with prominence of the epidural fat. Mild to moderate spinal stenosis. Foramina remain patent. T7-8: Shallow left paracentral disc protrusion. Prominence of the dorsal epidural fat with mild posterior element hypertrophy. Mild spinal stenosis. Foramina remain patent. T8-9: Small central irregular disc protrusion. Mild posterior element hypertrophy. Mild spinal stenosis. Foramina remain patent. T9-10: Left paracentral to foraminal disc protrusion. Posterior element hypertrophy. Mild spinal stenosis. Mild left neural foraminal narrowing. T10-11: Right paracentral disc protrusion with superior migration. Moderate facet hypertrophy. Mild flattening of the ventral cord with mild-to-moderate spinal stenosis. Moderate bilateral foraminal narrowing. T11-12: Right paracentral disc extrusion with superior migration. Secondary flattening of the right ventral cord. Moderate facet hypertrophy. Resultant moderate canal with bilateral foraminal stenosis. T12-L1: Right paracentral disc protrusion. Moderate facet hypertrophy. No significant stenosis. MRI LUMBAR SPINE FINDINGS Segmentation:  Standard. Alignment: 7 mm anterolisthesis of L5 on S1. Trace retrolisthesis of L1 on L2 and L2 on L3. Vertebrae:  Sequelae of prior PLIF at L3 through S1. Vertebral body height maintained without acute or chronic fracture. Diffusely decreased T1 signal intensity within the underlying bone marrow. There is abnormal edema involving the L2-3 disc, with marrow edema involving the adjacent L2 and L3 vertebral bodies, consistent with osteomyelitis discitis. Suspected additional involvement of the adjacent L1-2 disc as well. Conus medullaris and cauda equina: Conus extends to the L1 level. Conus and cauda equina appear normal. No visible significant epidural involvement or epidural abscess. Paraspinal and other soft tissues: Abnormal edema with multifocal abscesses seen involving the psoas muscles bilaterally. Largest of these seen on the right and measures 3.7 x 2.8 cm (series 28, image 30). Additional collection at the laminectomy site within the posterior soft tissues measures 2.9 x 2.1 x 5.9 cm, felt to be most consistent with a benign postoperative seroma. Additional chronic postoperative changes noted elsewhere within the posterior paraspinous soft tissues. No visible SI joint involvement. Disc levels: L1-2: Retrolisthesis. Intervertebral disc space narrowing with mild disc bulge. Moderate facet hypertrophy. No significant spinal stenosis. Mild bilateral L1 foraminal narrowing. L2-3: Trace retrolisthesis. Mild disc bulge. Advanced facet and ligament flavum hypertrophy. Resultant moderate spinal stenosis. Moderate bilateral L2 foraminal narrowing. L3-4:  Prior PLIF.  No residual stenosis. L4-5:  Prior PLIF.  No residual stenosis. L5-S1: Chronic 7 mm anterolisthesis with prior PLIF. No significant residual spinal stenosis. Probable residual mild to moderate foraminal narrowing largely due to slippage. IMPRESSION: MRI CERVICAL SPINE IMPRESSION: 1. No evidence for osteomyelitis discitis within the cervical spine. No epidural abscess or other abnormality. 2. Partially visualized collections/abscesses involving the left upper chest  wall, better seen on recent CT examinations. No extension to involve the underlying spinal canal or vertebral column. 3. Multilevel degenerative spondylosis with resultant mild to moderate spinal stenosis at C3-4 through C6-7 as above. Severe right worse than left C5 and C7 foraminal stenosis. MRI THORACIC SPINE IMPRESSION: 1. Findings consistent with acute  osteomyelitis involving the T3, T4, T5, and T6 vertebral bodies, with probable involvement of the anterior aspect of the T10 and T11 vertebral bodies. No convincing evidence for intervening discitis at this time. 2. Mild marrow edema and enhancement involving the right T3-4 and T4-5 facets, consistent with concomitant septic arthritis. 3. Enhancement involving the ventral epidural space at T3 through T6, consistent with mild/early epidural involvement. No frank epidural abscess. 4. Partially visualized left chest wall abscesses/collections with bilateral pleural effusions. 5. Multilevel degenerative spondylosis with resultant mild to moderate diffuse spinal stenosis throughout the thoracic spine as detailed above. MRI LUMBAR SPINE IMPRESSION: 1. 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 No appreciable epidural involvement at this time. 2. Multifocal abscesses involving the bilateral psoas musculature, largest of which on the right measures up to 3.7 cm. 3. Postoperative changes from prior PLIF at L3-4 through L5-S1 without residual spinal stenosis. Collection at the posterior laminectomy site felt to be most consistent with a benign postoperative seroma. 4. 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 Electronically Signed   By: Rise Mu M.D.   On: 02/15/2021 03:11   CT SHOULDER LEFT WO CONTRAST  Result Date: 02/14/2021 CLINICAL DATA:  Septic arthritis suspected, shoulder, xray done septic arthritis left shoulder EXAM: CT OF THE UPPER LEFT EXTREMITY WITHOUT  CONTRAST TECHNIQUE: Multidetector CT imaging of the upper left extremity was performed according to the standard protocol. COMPARISON:  CT 02/09/2021 FINDINGS: Bones/Joint/Cartilage There is no evidence of acute fracture. There is no new osseous erosion, destruction, or periosteal reaction. Persisting gas containing glenohumeral joint effusion and distention of the subacromial-subdeltoid bursa. Some of this gas/fluid is collecting in the subcoracoid recess. Ligaments Suboptimally assessed by CT. Muscles and Tendons There is persisting gas and fluid seen along the left upper chest wall/axilla, with most superior extent extending up the neck to the level of C6-C7 (axial image 3). There is persistent foci of intercostal gas (axial image 102/105). There is paraspinal muscle gas is well (axial image 82 and 51). Discrete abscesses are difficult to measure, with most well-defined area measuring 4.0 x 1.9 cm in the transverse plane by 7.5 cm in craniocaudal extent. Soft tissues As above. Left axillary lymphadenopathy. Small left pleural effusion. There is surgical material, likely packing along the left posterolateral soft tissues on axial image 112). IMPRESSION: Persistent, extensive abscess along the left chest wall/axilla, with visible gas/fluid extending superiorly in the neck to the level of C6/C7, and posteriorly to the paraspinal musculature. Most well-defined collection is within the axilla measuring 4.0 x 1.9 cm in the transverse plane and 7.5 cm in craniocaudal extent. Gas containing left shoulder joint effusion and distension of the subacromial-subdeltoid bursa, concerning for septic arthritis/bursitis, though these findings could in part be related to recent surgery. No new osseous abnormality to suggest osteomyelitis, though note CT is insensitive in detecting osteomyelitis. Electronically Signed   By: Caprice Renshaw   On: 02/14/2021 07:34   CT SHOULDER RIGHT WO CONTRAST  Result Date: 02/14/2021 CLINICAL DATA:   Septic arthritis suspected, shoulder, xray done septic arthritis right shoulder EXAM: CT OF THE UPPER RIGHT EXTREMITY WITHOUT CONTRAST TECHNIQUE: Multidetector CT imaging of the upper right extremity was performed according to the standard protocol. COMPARISON:  Right shoulder MRI 02/07/2021, CT 02/01/2021 FINDINGS: Bones/Joint/Cartilage Postsurgical changes of the right shoulder, including AC joint resection. There is soft tissue gas in fluid anteriorly, likely surgical related. There a small glenohumeral joint effusion  and small amount of fluid in the subacromial-subdeltoid bursa. Ligaments Suboptimally assessed by CT. Muscles and Tendons Findings of full-thickness cuff tear. Soft tissues There is soft tissue swelling along the shoulder. In subcutaneous edema extending down the arm. IMPRESSION: Postsurgical changes at the right shoulder including resection at the [facility] joint. Soft tissue gas and fluid anteriorly, likely related to recent surgery. Small glenohumeral joint effusion with mild distention of the subacromial-subdeltoid bursa. There is soft tissue edema without no well-defined soft tissue abscess along the right shoulder. No new osseous changes to suggest osteomyelitis, though note CT is insensitive in detecting osteomyelitis. Electronically Signed   By: Caprice RenshawJacob  Kahn   On: 02/14/2021 07:44   DG CHEST PORT 1 VIEW  Result Date: 02/13/2021 CLINICAL DATA:  Respiratory compromise EXAM: PORTABLE CHEST 1 VIEW COMPARISON:  02/10/2021 FINDINGS: Low lung volumes. Hazy atelectasis at the bases. Small left pleural effusion. Stable cardiomediastinal silhouette. No pneumothorax. IMPRESSION: Low lung volumes with small left effusion and hazy atelectasis at the bases Electronically Signed   By: Jasmine PangKim  Fujinaga M.D.   On: 02/13/2021 23:28    Anti-infectives: Anti-infectives (From admission, onward)    Start     Dose/Rate Route Frequency Ordered Stop   02/10/21 0727  ceFAZolin (ANCEF) 2-4 GM/100ML-% IVPB       Note  to Pharmacy: Lyda KalataJarvela, Joshua   : cabinet override      02/10/21 0727 02/10/21 1856   02/01/21 0600  ceFAZolin (ANCEF) IVPB 2g/100 mL premix        2 g 200 mL/hr over 30 Minutes Intravenous Every 8 hours 02/01/21 0414     01/31/21 1800  cefTRIAXone (ROCEPHIN) 1 g in sodium chloride 0.9 % 100 mL IVPB  Status:  Discontinued        1 g 200 mL/hr over 30 Minutes Intravenous Every 24 hours 01/31/21 1453 02/01/21 0425   01/31/21 1115  vancomycin (VANCOREADY) IVPB 2000 mg/400 mL        2,000 mg 200 mL/hr over 120 Minutes Intravenous  Once 01/31/21 1105 01/31/21 1320   01/31/21 1045  ceFEPIme (MAXIPIME) 2 g in sodium chloride 0.9 % 100 mL IVPB        2 g 200 mL/hr over 30 Minutes Intravenous  Once 01/31/21 1035 01/31/21 1129   01/31/21 1045  metroNIDAZOLE (FLAGYL) IVPB 500 mg        500 mg 100 mL/hr over 60 Minutes Intravenous  Once 01/31/21 1035 01/31/21 1208   01/31/21 1045  vancomycin (VANCOCIN) IVPB 1000 mg/200 mL premix  Status:  Discontinued        1,000 mg 200 mL/hr over 60 Minutes Intravenous  Once 01/31/21 1035 01/31/21 1105        Assessment/Plan Complex left lateral chest wall and axillary abscess POD5 S/p Incision and drainage of multiple left lateral chest wall abscesses along with excisional debridement of left lateral chest wall skin, soft tissue, muscle, and fascia and axilla 6/18 Dr. Andrey CampanileWilson - Cx with abundant staphylococcus aureus - ID following and managing antibiotics - CT chest 6/21 with decrease in the volume of gas and debris in the chest wall. CT shoulder on L show a persistent, extensive abscess along the left chest wall/axilla, with visible gas/fluid extending superiorly in the neck to the level of C6/C7, and posteriorly to the paraspinal musculature. Axilla fluid collection measures 4.0 x 1.9 x 7.5cm. Ortho recommended IR aspiration. Agree with this plan - We do not recommend any further debridement of chest wall at this time -  Cont Vac. Had some leakage overnight  and was able to regain seal by bedside RN. Plan to change with [facility] today if re-evals, otherwise on M/W/F schedule with [facility].  - Noted MRI results with osteo w/ T3-6, T10-11 acute osteomyelitis, L1-L3 osteomyelitis discitis, and ? Septic arthitis right T3-4 and T4-5 facets. Will defer management to NSGY. There was also bilateral psoas abscess R>L (3.7cm). Will discuss with MD about this but would defer to ortho/IR recommendations for now.  - This is a very complex case. I discussed in person with [facility] to ensure we are on the same page for the patients care plan. - We will follow with you    Bilateral shoulder abscess, s/p debridement 6/16 and 6/18 - Per Dr. Dion Saucier - As above   ID - currently ancef 6/9>> Tmax 102.9. WBC down from 15.5 > 12.1 >10.7 > 10.5. 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 Septic arthitis right T3-4 and T4-5 facets on MRI Bilateral psoas abscess, R>L (3.7cm) HTN DM Chronic back pain   LOS: 15 days    [name] , [facility] 02/15/2021, 8:45 AM Please see Amion for pager number during day hours 7:00am-4:30pm

## 2021-02-15 NOTE — Progress Notes (Signed)
Subjective:   Patient is alert, oriented. States pain in shoulders is moderate, worse with movement and worse with left than right. Pain and range of motion moderately improved from yesterday. Fever overnight. No other complaints.   Objective:  PE: VITALS:   Vitals:   02/15/21 0138 02/15/21 0422 02/15/21 0601 02/15/21 1004  BP: 117/62 108/65 113/73 105/69  Pulse: 96 84 91 92  Resp: 19 19 19  (!) 22  Temp: 99 F (37.2 C) 97.9 F (36.6 C) 99.2 F (37.3 C) 98.8 F (37.1 C)  TempSrc: Oral Oral Oral   SpO2: 99% 92% 94% 95%  Weight:      Height:       0-20 degrees of active forward flexion of left shoulder, 0-5 deg right shoulder, limited by pain. No erythema. No drainage seen on dressings. Distal sensation and capillary refill intact.   LABS  Results for orders placed or performed during the hospital encounter of 01/31/21 (from the past 24 hour(s))  Glucose, capillary     Status: Abnormal   Collection Time: 02/14/21  4:42 PM  Result Value Ref Range   Glucose-Capillary 211 (H) 70 - 99 mg/dL  Glucose, capillary     Status: Abnormal   Collection Time: 02/14/21 10:06 PM  Result Value Ref Range   Glucose-Capillary 104 (H) 70 - 99 mg/dL  CBC with Differential/Platelet     Status: Abnormal   Collection Time: 02/15/21  5:58 AM  Result Value Ref Range   WBC 10.5 4.0 - 10.5 K/uL   RBC 2.52 (L) 4.22 - 5.81 MIL/uL   Hemoglobin 7.7 (L) 13.0 - 17.0 g/dL   HCT 02/17/21 (L) 08.6 - 57.8 %   MCV 95.6 80.0 - 100.0 fL   MCH 30.6 26.0 - 34.0 pg   MCHC 32.0 30.0 - 36.0 g/dL   RDW 46.9 62.9 - 52.8 %   Platelets 537 (H) 150 - 400 K/uL   nRBC 0.0 0.0 - 0.2 %   Neutrophils Relative % 74 %   Neutro Abs 7.9 (H) 1.7 - 7.7 K/uL   Lymphocytes Relative 16 %   Lymphs Abs 1.7 0.7 - 4.0 K/uL   Monocytes Relative 7 %   Monocytes Absolute 0.8 0.1 - 1.0 K/uL   Eosinophils Relative 1 %   Eosinophils Absolute 0.1 0.0 - 0.5 K/uL   Basophils Relative 1 %   Basophils Absolute 0.1 0.0 - 0.1 K/uL    Immature Granulocytes 1 %   Abs Immature Granulocytes 0.07 0.00 - 0.07 K/uL  Basic metabolic panel     Status: Abnormal   Collection Time: 02/15/21  5:58 AM  Result Value Ref Range   Sodium 131 (L) 135 - 145 mmol/L   Potassium 4.1 3.5 - 5.1 mmol/L   Chloride 93 (L) 98 - 111 mmol/L   CO2 30 22 - 32 mmol/L   Glucose, Bld 126 (H) 70 - 99 mg/dL   BUN 9 6 - 20 mg/dL   Creatinine, Ser 02/17/21 (L) 0.61 - 1.24 mg/dL   Calcium 8.3 (L) 8.9 - 10.3 mg/dL   GFR, Estimated 2.44 >01 mL/min   Anion gap 8 5 - 15  Magnesium     Status: None   Collection Time: 02/15/21  5:58 AM  Result Value Ref Range   Magnesium 1.9 1.7 - 2.4 mg/dL  Glucose, capillary     Status: Abnormal   Collection Time: 02/15/21  7:27 AM  Result Value Ref Range   Glucose-Capillary 122 (H) 70 -  99 mg/dL  Glucose, capillary     Status: Abnormal   Collection Time: 02/15/21 11:51 AM  Result Value Ref Range   Glucose-Capillary 114 (H) 70 - 99 mg/dL    CT CHEST WO CONTRAST  Result Date: 02/13/2021 CLINICAL DATA:  History of shoulder infection. EXAM: CT CHEST WITHOUT CONTRAST TECHNIQUE: Multidetector CT imaging of the chest was performed following the standard protocol without IV contrast. COMPARISON:  CT chest 02/09/2021 FINDINGS: Cardiovascular: The heart size is normal. No substantial pericardial effusion. No thoracic aortic aneurysm. Mediastinum/Nodes: No mediastinal lymphadenopathy. No evidence for gross hilar lymphadenopathy although assessment is limited by the lack of intravenous contrast on today's study. The esophagus has normal imaging features. Bulky lymphadenopathy is again identified in the left axilla. Lungs/Pleura: 7 mm subpleural left upper lobe pulmonary nodule on 71/7 is similar to prior. 6 mm nodule in the medial left upper lobe (91/7) and 12 mm nodule in the posterior left upper lobe are similar to prior. Bibasilar collapse/consolidation is not substantially changed in the interval. Probable tiny bilateral effusions. Upper  Abdomen: Unremarkable. Musculoskeletal: Soft tissue gas and fluid is identified in the left supraclavicular region, around the left shoulder, and in the lateral and posterior left chest wall. Fluid and gas in the supraclavicular region tracking posteriorly into the subscapular space is similar to prior. Diffuse edema noted in the muscles of the rotator cuff. Surgical drain noted posterolateral lower left thoracic wall with interval decrease in soft tissue gas and swelling in this region. There is an open wound in the region of the cervical drain. Gas and fluid are seen in the region of the right shoulder joint, with more gas visible on today's study than on the previous exam. Diffuse edema noted in the muscles of the rotator cuff. IMPRESSION: 1. Interval drain placement in the lateral and posterolateral left chest wall with decrease in the volume of gas and debris in the chest wall. 2. Similar gas in fluid in the left supraclavicular space and region of the left shoulder joint with diffuse edema of the rotator cuff muscles. 3. Gas is now visible in the soft tissues anterior to the right shoulder with similar appearance of fluid and edema in the soft tissues and muscles around the right shoulder. Electronically Signed   By: Kennith Center M.D.   On: 02/13/2021 17:37   MR CERVICAL SPINE W WO CONTRAST  Result Date: 02/15/2021 CLINICAL DATA:  55 year old male with history of disseminated MSSA infection, evaluate for osteomyelitis discitis. EXAM: MRI CERVICAL, THORACIC AND LUMBAR SPINE WITHOUT AND WITH CONTRAST TECHNIQUE: Multiplanar and multiecho pulse sequences of the cervical spine, to include the craniocervical junction and cervicothoracic junction, and thoracic and lumbar spine, were obtained without and with intravenous contrast. CONTRAST:  48mL GADAVIST GADOBUTROL 1 MMOL/ML IV SOLN COMPARISON:  Prior CT from 02/13/2021. FINDINGS: MRI CERVICAL SPINE FINDINGS Alignment: Examination somewhat technically limited as  the patient was unable to complete the entirety of the exam. No postcontrast images of the cervical spine are provided. Mild straightening of the normal cervical lordosis.  No listhesis. Vertebrae: Vertebral body height maintained without acute or chronic fracture. Diffusely decreased T1 signal intensity seen throughout the visualized bone marrow, nonspecific, but most commonly related to anemia, smoking, or obesity. No discrete or worrisome osseous lesions. No findings to suggest osteomyelitis discitis or septic arthritis within the cervical spine. Cord: Normal signal and morphology. No epidural abscess or other collection. Posterior Fossa, vertebral arteries, paraspinal tissues: Partially empty sella noted. Visualized brain and  posterior fossa otherwise unremarkable. Craniocervical junction normal. Known fluid collections/abscess involving the left chest wall are partially visualized. Largest loculated component seen at the left upper posterior chest and measures 5.3 x 1.6 cm (series 34, image 34). Additional separate and partially visualized loculated component position slightly laterally measures 4.3 x 1.6 cm (series 34, image 3). Scattered foci of internal susceptibility artifact consistent with gas. No extension to involve the underlying spinal canal or vertebral column. Associated scattered ill-defined edema noted within the musculature of the visualized upper posterior chest. Prevertebral soft tissues are within normal limits. Normal flow voids seen within the vertebral arteries bilaterally. Disc levels: C2-C3: Mild uncovertebral hypertrophy without significant disc bulge. Right worse than left facet hypertrophy. No spinal stenosis. Mild to moderate right C3 foraminal narrowing. Left neural foramina remains patent. C3-C4: Left paracentral disc protrusion with slight inferior migration indents the left ventral thecal sac (series 33, image 10). Mild flattening of the left ventral cord without cord signal  changes. Mild spinal stenosis. Superimposed uncovertebral and facet hypertrophy with resultant mild to moderate right worse than left C4 foraminal stenosis. C4-C5: Disc desiccation. Broad-based central disc protrusion indents the ventral thecal sac, eccentric to the left (series 33, image 15). Superimposed uncovertebral and facet hypertrophy. Resultant mild to moderate spinal stenosis. Severe right worse than left C5 foraminal narrowing. C5-C6: Small right paracentral disc protrusion indents the ventral thecal sac (series 34, image 25). Mild spinal stenosis without cord deformity. Superimposed bilateral facet hypertrophy. Mild to moderate left worse than right C6 foraminal narrowing. C6-C7: Disc desiccation with diffuse disc bulge. Superimposed central disc extrusion with inferior migration. Extruded disc material contacts and mildly flattens the ventral cord. No cord signal changes. Moderate spinal stenosis. Superimposed facet and uncovertebral hypertrophy with resultant severe right worse than left C7 foraminal stenosis. C7-T1: Small amount of extruded disc material extends from the C6-7 level above. No other disc pathology at this level. Right worse than left facet hypertrophy. No significant spinal stenosis. Mild right C8 foraminal narrowing. Left neural foramina remains patent. MRI THORACIC SPINE FINDINGS Alignment:  Examination technically limited by motion artifact. Normal alignment with preservation of the normal thoracic kyphosis. No listhesis. Vertebrae: Vertebral body height maintained without acute or chronic fracture. Bone marrow signal intensity diffusely decreased on T1 weighted imaging. 1.5 cm benign hemangioma noted within the T7 vertebral body. No worrisome osseous lesions. Abnormal marrow edema and enhancement seen involving the T3, T4, T5, and T6 vertebral bodies, suspicious for acute osteomyelitis. Probable involvement of the anterior aspects of the T10 and T11 vertebral bodies as well (series  37, image 4) no convincing evidence for intervening discitis at this time. Mild marrow edema seen about the right T3-4 and T4-5 facets, suspected to reflect concomitant septic arthritis (series 37, image 2). Cord: No convincing cord signal abnormality seen on this motion degraded exam. Thin enhancement seen involving the ventral epidural space at T3 through T6, likely related to mild infection/phlegmon (series 37, image 6). Possible scattered dorsal involvement noted as well. No frank epidural abscess. Paraspinal and other soft tissues: Known left chest wall collections/abscesses are partially visualized, with largest component seen at the upper left posterior chest measuring 11.8 x 1.9 cm (series 38, image 4). Mild paraspinous phlegmon/enhancement seen adjacent to the infected vertebral bodies, most pronounced at T3-4 and T4-5 (series 39, image 10, 15) no frank paraspinous abscess or collection. Mildly irregular layering bilateral pleural effusions, left greater than right partially visualized. Atelectasis and/or infiltrates noted within the partially visualized lungs. Disc  levels: T1-2: Negative interspace. Bilateral facet hypertrophy. No stenosis. T2-3: Facet hypertrophy. Probable right foraminal disc protrusion. No significant spinal stenosis. Mild bilateral foraminal narrowing. T3-4: Negative interspace. Bilateral facet hypertrophy with possible septic arthritis on the right. No significant stenosis. T4-5: Central/right paracentral disc protrusion indents the ventral thecal sac. Flattening of the right ventral cord. Posterior element hypertrophy. Mild spinal stenosis. Foramina remain patent. T5-6: Right paracentral disc protrusion indents the right ventral thecal sac, contacting and flattening the right cord. Moderate spinal stenosis. Foramina remain patent. T6-7: Central/left paracentral disc extrusion with superior migration contacts and flattens the ventral cord. Posterior element hypertrophy with prominence  of the epidural fat. Mild to moderate spinal stenosis. Foramina remain patent. T7-8: Shallow left paracentral disc protrusion. Prominence of the dorsal epidural fat with mild posterior element hypertrophy. Mild spinal stenosis. Foramina remain patent. T8-9: Small central irregular disc protrusion. Mild posterior element hypertrophy. Mild spinal stenosis. Foramina remain patent. T9-10: Left paracentral to foraminal disc protrusion. Posterior element hypertrophy. Mild spinal stenosis. Mild left neural foraminal narrowing. T10-11: Right paracentral disc protrusion with superior migration. Moderate facet hypertrophy. Mild flattening of the ventral cord with mild-to-moderate spinal stenosis. Moderate bilateral foraminal narrowing. T11-12: Right paracentral disc extrusion with superior migration. Secondary flattening of the right ventral cord. Moderate facet hypertrophy. Resultant moderate canal with bilateral foraminal stenosis. T12-L1: Right paracentral disc protrusion. Moderate facet hypertrophy. No significant stenosis. MRI LUMBAR SPINE FINDINGS Segmentation:  Standard. Alignment: 7 mm anterolisthesis of L5 on S1. Trace retrolisthesis of L1 on L2 and L2 on L3. Vertebrae: Sequelae of prior PLIF at L3 through S1. Vertebral body height maintained without acute or chronic fracture. Diffusely decreased T1 signal intensity within the underlying bone marrow. There is abnormal edema involving the L2-3 disc, with marrow edema involving the adjacent L2 and L3 vertebral bodies, consistent with osteomyelitis discitis. Suspected additional involvement of the adjacent L1-2 disc as well. Conus medullaris and cauda equina: Conus extends to the L1 level. Conus and cauda equina appear normal. No visible significant epidural involvement or epidural abscess. Paraspinal and other soft tissues: Abnormal edema with multifocal abscesses seen involving the psoas muscles bilaterally. Largest of these seen on the right and measures 3.7 x 2.8 cm  (series 28, image 30). Additional collection at the laminectomy site within the posterior soft tissues measures 2.9 x 2.1 x 5.9 cm, felt to be most consistent with a benign postoperative seroma. Additional chronic postoperative changes noted elsewhere within the posterior paraspinous soft tissues. No visible SI joint involvement. Disc levels: L1-2: Retrolisthesis. Intervertebral disc space narrowing with mild disc bulge. Moderate facet hypertrophy. No significant spinal stenosis. Mild bilateral L1 foraminal narrowing. L2-3: Trace retrolisthesis. Mild disc bulge. Advanced facet and ligament flavum hypertrophy. Resultant moderate spinal stenosis. Moderate bilateral L2 foraminal narrowing. L3-4:  Prior PLIF.  No residual stenosis. L4-5:  Prior PLIF.  No residual stenosis. L5-S1: Chronic 7 mm anterolisthesis with prior PLIF. No significant residual spinal stenosis. Probable residual mild to moderate foraminal narrowing largely due to slippage. IMPRESSION: MRI CERVICAL SPINE IMPRESSION: 1. No evidence for osteomyelitis discitis within the cervical spine. No epidural abscess or other abnormality. 2. Partially visualized collections/abscesses involving the left upper chest wall, better seen on recent CT examinations. No extension to involve the underlying spinal canal or vertebral column. 3. Multilevel degenerative spondylosis with resultant mild to moderate spinal stenosis at C3-4 through C6-7 as above. Severe right worse than left C5 and C7 foraminal stenosis. MRI THORACIC SPINE IMPRESSION: 1. Findings consistent with acute osteomyelitis involving  the T3, T4, T5, and T6 vertebral bodies, with probable involvement of the anterior aspect of the T10 and T11 vertebral bodies. No convincing evidence for intervening discitis at this time. 2. Mild marrow edema and enhancement involving the right T3-4 and T4-5 facets, consistent with concomitant septic arthritis. 3. Enhancement involving the ventral epidural space at T3 through  T6, consistent with mild/early epidural involvement. No frank epidural abscess. 4. Partially visualized left chest wall abscesses/collections with bilateral pleural effusions. 5. Multilevel degenerative spondylosis with resultant mild to moderate diffuse spinal stenosis throughout the thoracic spine as detailed above. MRI LUMBAR SPINE IMPRESSION: 1. 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 No appreciable epidural involvement at this time. 2. Multifocal abscesses involving the bilateral psoas musculature, largest of which on the right measures up to 3.7 cm. 3. Postoperative changes from prior PLIF at L3-4 through L5-S1 without residual spinal stenosis. Collection at the posterior laminectomy site felt to be most consistent with a benign postoperative seroma. 4. 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 Electronically Signed   By: Rise MuBenjamin  McClintock M.D.   On: 02/15/2021 03:11   MR THORACIC SPINE W WO CONTRAST  Result Date: 02/15/2021 CLINICAL DATA:  55 year old male with history of disseminated MSSA infection, evaluate for osteomyelitis discitis. EXAM: MRI CERVICAL, THORACIC AND LUMBAR SPINE WITHOUT AND WITH CONTRAST TECHNIQUE: Multiplanar and multiecho pulse sequences of the cervical spine, to include the craniocervical junction and cervicothoracic junction, and thoracic and lumbar spine, were obtained without and with intravenous contrast. CONTRAST:  10mL GADAVIST GADOBUTROL 1 MMOL/ML IV SOLN COMPARISON:  Prior CT from 02/13/2021. FINDINGS: MRI CERVICAL SPINE FINDINGS Alignment: Examination somewhat technically limited as the patient was unable to complete the entirety of the exam. No postcontrast images of the cervical spine are provided. Mild straightening of the normal cervical lordosis.  No listhesis. Vertebrae: Vertebral body height maintained without acute or chronic fracture. Diffusely decreased T1 signal  intensity seen throughout the visualized bone marrow, nonspecific, but most commonly related to anemia, smoking, or obesity. No discrete or worrisome osseous lesions. No findings to suggest osteomyelitis discitis or septic arthritis within the cervical spine. Cord: Normal signal and morphology. No epidural abscess or other collection. Posterior Fossa, vertebral arteries, paraspinal tissues: Partially empty sella noted. Visualized brain and posterior fossa otherwise unremarkable. Craniocervical junction normal. Known fluid collections/abscess involving the left chest wall are partially visualized. Largest loculated component seen at the left upper posterior chest and measures 5.3 x 1.6 cm (series 34, image 34). Additional separate and partially visualized loculated component position slightly laterally measures 4.3 x 1.6 cm (series 34, image 3). Scattered foci of internal susceptibility artifact consistent with gas. No extension to involve the underlying spinal canal or vertebral column. Associated scattered ill-defined edema noted within the musculature of the visualized upper posterior chest. Prevertebral soft tissues are within normal limits. Normal flow voids seen within the vertebral arteries bilaterally. Disc levels: C2-C3: Mild uncovertebral hypertrophy without significant disc bulge. Right worse than left facet hypertrophy. No spinal stenosis. Mild to moderate right C3 foraminal narrowing. Left neural foramina remains patent. C3-C4: Left paracentral disc protrusion with slight inferior migration indents the left ventral thecal sac (series 33, image 10). Mild flattening of the left ventral cord without cord signal changes. Mild spinal stenosis. Superimposed uncovertebral and facet hypertrophy with resultant mild to moderate right worse than left C4 foraminal stenosis. C4-C5: Disc desiccation. Broad-based central disc protrusion indents the ventral thecal sac,  eccentric to the left (series 33, image 15).  Superimposed uncovertebral and facet hypertrophy. Resultant mild to moderate spinal stenosis. Severe right worse than left C5 foraminal narrowing. C5-C6: Small right paracentral disc protrusion indents the ventral thecal sac (series 34, image 25). Mild spinal stenosis without cord deformity. Superimposed bilateral facet hypertrophy. Mild to moderate left worse than right C6 foraminal narrowing. C6-C7: Disc desiccation with diffuse disc bulge. Superimposed central disc extrusion with inferior migration. Extruded disc material contacts and mildly flattens the ventral cord. No cord signal changes. Moderate spinal stenosis. Superimposed facet and uncovertebral hypertrophy with resultant severe right worse than left C7 foraminal stenosis. C7-T1: Small amount of extruded disc material extends from the C6-7 level above. No other disc pathology at this level. Right worse than left facet hypertrophy. No significant spinal stenosis. Mild right C8 foraminal narrowing. Left neural foramina remains patent. MRI THORACIC SPINE FINDINGS Alignment:  Examination technically limited by motion artifact. Normal alignment with preservation of the normal thoracic kyphosis. No listhesis. Vertebrae: Vertebral body height maintained without acute or chronic fracture. Bone marrow signal intensity diffusely decreased on T1 weighted imaging. 1.5 cm benign hemangioma noted within the T7 vertebral body. No worrisome osseous lesions. Abnormal marrow edema and enhancement seen involving the T3, T4, T5, and T6 vertebral bodies, suspicious for acute osteomyelitis. Probable involvement of the anterior aspects of the T10 and T11 vertebral bodies as well (series 37, image 4) no convincing evidence for intervening discitis at this time. Mild marrow edema seen about the right T3-4 and T4-5 facets, suspected to reflect concomitant septic arthritis (series 37, image 2). Cord: No convincing cord signal abnormality seen on this motion degraded exam. Thin  enhancement seen involving the ventral epidural space at T3 through T6, likely related to mild infection/phlegmon (series 37, image 6). Possible scattered dorsal involvement noted as well. No frank epidural abscess. Paraspinal and other soft tissues: Known left chest wall collections/abscesses are partially visualized, with largest component seen at the upper left posterior chest measuring 11.8 x 1.9 cm (series 38, image 4). Mild paraspinous phlegmon/enhancement seen adjacent to the infected vertebral bodies, most pronounced at T3-4 and T4-5 (series 39, image 10, 15) no frank paraspinous abscess or collection. Mildly irregular layering bilateral pleural effusions, left greater than right partially visualized. Atelectasis and/or infiltrates noted within the partially visualized lungs. Disc levels: T1-2: Negative interspace. Bilateral facet hypertrophy. No stenosis. T2-3: Facet hypertrophy. Probable right foraminal disc protrusion. No significant spinal stenosis. Mild bilateral foraminal narrowing. T3-4: Negative interspace. Bilateral facet hypertrophy with possible septic arthritis on the right. No significant stenosis. T4-5: Central/right paracentral disc protrusion indents the ventral thecal sac. Flattening of the right ventral cord. Posterior element hypertrophy. Mild spinal stenosis. Foramina remain patent. T5-6: Right paracentral disc protrusion indents the right ventral thecal sac, contacting and flattening the right cord. Moderate spinal stenosis. Foramina remain patent. T6-7: Central/left paracentral disc extrusion with superior migration contacts and flattens the ventral cord. Posterior element hypertrophy with prominence of the epidural fat. Mild to moderate spinal stenosis. Foramina remain patent. T7-8: Shallow left paracentral disc protrusion. Prominence of the dorsal epidural fat with mild posterior element hypertrophy. Mild spinal stenosis. Foramina remain patent. T8-9: Small central irregular disc  protrusion. Mild posterior element hypertrophy. Mild spinal stenosis. Foramina remain patent. T9-10: Left paracentral to foraminal disc protrusion. Posterior element hypertrophy. Mild spinal stenosis. Mild left neural foraminal narrowing. T10-11: Right paracentral disc protrusion with superior migration. Moderate facet hypertrophy. Mild flattening of the ventral cord with mild-to-moderate spinal stenosis. Moderate  bilateral foraminal narrowing. T11-12: Right paracentral disc extrusion with superior migration. Secondary flattening of the right ventral cord. Moderate facet hypertrophy. Resultant moderate canal with bilateral foraminal stenosis. T12-L1: Right paracentral disc protrusion. Moderate facet hypertrophy. No significant stenosis. MRI LUMBAR SPINE FINDINGS Segmentation:  Standard. Alignment: 7 mm anterolisthesis of L5 on S1. Trace retrolisthesis of L1 on L2 and L2 on L3. Vertebrae: Sequelae of prior PLIF at L3 through S1. Vertebral body height maintained without acute or chronic fracture. Diffusely decreased T1 signal intensity within the underlying bone marrow. There is abnormal edema involving the L2-3 disc, with marrow edema involving the adjacent L2 and L3 vertebral bodies, consistent with osteomyelitis discitis. Suspected additional involvement of the adjacent L1-2 disc as well. Conus medullaris and cauda equina: Conus extends to the L1 level. Conus and cauda equina appear normal. No visible significant epidural involvement or epidural abscess. Paraspinal and other soft tissues: Abnormal edema with multifocal abscesses seen involving the psoas muscles bilaterally. Largest of these seen on the right and measures 3.7 x 2.8 cm (series 28, image 30). Additional collection at the laminectomy site within the posterior soft tissues measures 2.9 x 2.1 x 5.9 cm, felt to be most consistent with a benign postoperative seroma. Additional chronic postoperative changes noted elsewhere within the posterior paraspinous  soft tissues. No visible SI joint involvement. Disc levels: L1-2: Retrolisthesis. Intervertebral disc space narrowing with mild disc bulge. Moderate facet hypertrophy. No significant spinal stenosis. Mild bilateral L1 foraminal narrowing. L2-3: Trace retrolisthesis. Mild disc bulge. Advanced facet and ligament flavum hypertrophy. Resultant moderate spinal stenosis. Moderate bilateral L2 foraminal narrowing. L3-4:  Prior PLIF.  No residual stenosis. L4-5:  Prior PLIF.  No residual stenosis. L5-S1: Chronic 7 mm anterolisthesis with prior PLIF. No significant residual spinal stenosis. Probable residual mild to moderate foraminal narrowing largely due to slippage. IMPRESSION: MRI CERVICAL SPINE IMPRESSION: 1. No evidence for osteomyelitis discitis within the cervical spine. No epidural abscess or other abnormality. 2. Partially visualized collections/abscesses involving the left upper chest wall, better seen on recent CT examinations. No extension to involve the underlying spinal canal or vertebral column. 3. Multilevel degenerative spondylosis with resultant mild to moderate spinal stenosis at C3-4 through C6-7 as above. Severe right worse than left C5 and C7 foraminal stenosis. MRI THORACIC SPINE IMPRESSION: 1. Findings consistent with acute osteomyelitis involving the T3, T4, T5, and T6 vertebral bodies, with probable involvement of the anterior aspect of the T10 and T11 vertebral bodies. No convincing evidence for intervening discitis at this time. 2. Mild marrow edema and enhancement involving the right T3-4 and T4-5 facets, consistent with concomitant septic arthritis. 3. Enhancement involving the ventral epidural space at T3 through T6, consistent with mild/early epidural involvement. No frank epidural abscess. 4. Partially visualized left chest wall abscesses/collections with bilateral pleural effusions. 5. Multilevel degenerative spondylosis with resultant mild to moderate diffuse spinal stenosis throughout the  thoracic spine as detailed above. MRI LUMBAR SPINE IMPRESSION: 1. 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 No appreciable epidural involvement at this time. 2. Multifocal abscesses involving the bilateral psoas musculature, largest of which on the right measures up to 3.7 cm. 3. Postoperative changes from prior PLIF at L3-4 through L5-S1 without residual spinal stenosis. Collection at the posterior laminectomy site felt to be most consistent with a benign postoperative seroma. 4. 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 Electronically Signed   By: Rise Mu M.D.   On: 02/15/2021  03:11   MR Lumbar Spine W Wo Contrast  Result Date: 02/15/2021 CLINICAL DATA:  55 year old male with history of disseminated MSSA infection, evaluate for osteomyelitis discitis. EXAM: MRI CERVICAL, THORACIC AND LUMBAR SPINE WITHOUT AND WITH CONTRAST TECHNIQUE: Multiplanar and multiecho pulse sequences of the cervical spine, to include the craniocervical junction and cervicothoracic junction, and thoracic and lumbar spine, were obtained without and with intravenous contrast. CONTRAST:  10mL GADAVIST GADOBUTROL 1 MMOL/ML IV SOLN COMPARISON:  Prior CT from 02/13/2021. FINDINGS: MRI CERVICAL SPINE FINDINGS Alignment: Examination somewhat technically limited as the patient was unable to complete the entirety of the exam. No postcontrast images of the cervical spine are provided. Mild straightening of the normal cervical lordosis.  No listhesis. Vertebrae: Vertebral body height maintained without acute or chronic fracture. Diffusely decreased T1 signal intensity seen throughout the visualized bone marrow, nonspecific, but most commonly related to anemia, smoking, or obesity. No discrete or worrisome osseous lesions. No findings to suggest osteomyelitis discitis or septic arthritis within the cervical spine. Cord: Normal signal and  morphology. No epidural abscess or other collection. Posterior Fossa, vertebral arteries, paraspinal tissues: Partially empty sella noted. Visualized brain and posterior fossa otherwise unremarkable. Craniocervical junction normal. Known fluid collections/abscess involving the left chest wall are partially visualized. Largest loculated component seen at the left upper posterior chest and measures 5.3 x 1.6 cm (series 34, image 34). Additional separate and partially visualized loculated component position slightly laterally measures 4.3 x 1.6 cm (series 34, image 3). Scattered foci of internal susceptibility artifact consistent with gas. No extension to involve the underlying spinal canal or vertebral column. Associated scattered ill-defined edema noted within the musculature of the visualized upper posterior chest. Prevertebral soft tissues are within normal limits. Normal flow voids seen within the vertebral arteries bilaterally. Disc levels: C2-C3: Mild uncovertebral hypertrophy without significant disc bulge. Right worse than left facet hypertrophy. No spinal stenosis. Mild to moderate right C3 foraminal narrowing. Left neural foramina remains patent. C3-C4: Left paracentral disc protrusion with slight inferior migration indents the left ventral thecal sac (series 33, image 10). Mild flattening of the left ventral cord without cord signal changes. Mild spinal stenosis. Superimposed uncovertebral and facet hypertrophy with resultant mild to moderate right worse than left C4 foraminal stenosis. C4-C5: Disc desiccation. Broad-based central disc protrusion indents the ventral thecal sac, eccentric to the left (series 33, image 15). Superimposed uncovertebral and facet hypertrophy. Resultant mild to moderate spinal stenosis. Severe right worse than left C5 foraminal narrowing. C5-C6: Small right paracentral disc protrusion indents the ventral thecal sac (series 34, image 25). Mild spinal stenosis without cord  deformity. Superimposed bilateral facet hypertrophy. Mild to moderate left worse than right C6 foraminal narrowing. C6-C7: Disc desiccation with diffuse disc bulge. Superimposed central disc extrusion with inferior migration. Extruded disc material contacts and mildly flattens the ventral cord. No cord signal changes. Moderate spinal stenosis. Superimposed facet and uncovertebral hypertrophy with resultant severe right worse than left C7 foraminal stenosis. C7-T1: Small amount of extruded disc material extends from the C6-7 level above. No other disc pathology at this level. Right worse than left facet hypertrophy. No significant spinal stenosis. Mild right C8 foraminal narrowing. Left neural foramina remains patent. MRI THORACIC SPINE FINDINGS Alignment:  Examination technically limited by motion artifact. Normal alignment with preservation of the normal thoracic kyphosis. No listhesis. Vertebrae: Vertebral body height maintained without acute or chronic fracture. Bone marrow signal intensity diffusely decreased on T1 weighted imaging. 1.5 cm benign hemangioma noted within the T7  vertebral body. No worrisome osseous lesions. Abnormal marrow edema and enhancement seen involving the T3, T4, T5, and T6 vertebral bodies, suspicious for acute osteomyelitis. Probable involvement of the anterior aspects of the T10 and T11 vertebral bodies as well (series 37, image 4) no convincing evidence for intervening discitis at this time. Mild marrow edema seen about the right T3-4 and T4-5 facets, suspected to reflect concomitant septic arthritis (series 37, image 2). Cord: No convincing cord signal abnormality seen on this motion degraded exam. Thin enhancement seen involving the ventral epidural space at T3 through T6, likely related to mild infection/phlegmon (series 37, image 6). Possible scattered dorsal involvement noted as well. No frank epidural abscess. Paraspinal and other soft tissues: Known left chest wall  collections/abscesses are partially visualized, with largest component seen at the upper left posterior chest measuring 11.8 x 1.9 cm (series 38, image 4). Mild paraspinous phlegmon/enhancement seen adjacent to the infected vertebral bodies, most pronounced at T3-4 and T4-5 (series 39, image 10, 15) no frank paraspinous abscess or collection. Mildly irregular layering bilateral pleural effusions, left greater than right partially visualized. Atelectasis and/or infiltrates noted within the partially visualized lungs. Disc levels: T1-2: Negative interspace. Bilateral facet hypertrophy. No stenosis. T2-3: Facet hypertrophy. Probable right foraminal disc protrusion. No significant spinal stenosis. Mild bilateral foraminal narrowing. T3-4: Negative interspace. Bilateral facet hypertrophy with possible septic arthritis on the right. No significant stenosis. T4-5: Central/right paracentral disc protrusion indents the ventral thecal sac. Flattening of the right ventral cord. Posterior element hypertrophy. Mild spinal stenosis. Foramina remain patent. T5-6: Right paracentral disc protrusion indents the right ventral thecal sac, contacting and flattening the right cord. Moderate spinal stenosis. Foramina remain patent. T6-7: Central/left paracentral disc extrusion with superior migration contacts and flattens the ventral cord. Posterior element hypertrophy with prominence of the epidural fat. Mild to moderate spinal stenosis. Foramina remain patent. T7-8: Shallow left paracentral disc protrusion. Prominence of the dorsal epidural fat with mild posterior element hypertrophy. Mild spinal stenosis. Foramina remain patent. T8-9: Small central irregular disc protrusion. Mild posterior element hypertrophy. Mild spinal stenosis. Foramina remain patent. T9-10: Left paracentral to foraminal disc protrusion. Posterior element hypertrophy. Mild spinal stenosis. Mild left neural foraminal narrowing. T10-11: Right paracentral disc  protrusion with superior migration. Moderate facet hypertrophy. Mild flattening of the ventral cord with mild-to-moderate spinal stenosis. Moderate bilateral foraminal narrowing. T11-12: Right paracentral disc extrusion with superior migration. Secondary flattening of the right ventral cord. Moderate facet hypertrophy. Resultant moderate canal with bilateral foraminal stenosis. T12-L1: Right paracentral disc protrusion. Moderate facet hypertrophy. No significant stenosis. MRI LUMBAR SPINE FINDINGS Segmentation:  Standard. Alignment: 7 mm anterolisthesis of L5 on S1. Trace retrolisthesis of L1 on L2 and L2 on L3. Vertebrae: Sequelae of prior PLIF at L3 through S1. Vertebral body height maintained without acute or chronic fracture. Diffusely decreased T1 signal intensity within the underlying bone marrow. There is abnormal edema involving the L2-3 disc, with marrow edema involving the adjacent L2 and L3 vertebral bodies, consistent with osteomyelitis discitis. Suspected additional involvement of the adjacent L1-2 disc as well. Conus medullaris and cauda equina: Conus extends to the L1 level. Conus and cauda equina appear normal. No visible significant epidural involvement or epidural abscess. Paraspinal and other soft tissues: Abnormal edema with multifocal abscesses seen involving the psoas muscles bilaterally. Largest of these seen on the right and measures 3.7 x 2.8 cm (series 28, image 30). Additional collection at the laminectomy site within the posterior soft tissues measures 2.9 x 2.1 x 5.9  cm, felt to be most consistent with a benign postoperative seroma. Additional chronic postoperative changes noted elsewhere within the posterior paraspinous soft tissues. No visible SI joint involvement. Disc levels: L1-2: Retrolisthesis. Intervertebral disc space narrowing with mild disc bulge. Moderate facet hypertrophy. No significant spinal stenosis. Mild bilateral L1 foraminal narrowing. L2-3: Trace retrolisthesis. Mild  disc bulge. Advanced facet and ligament flavum hypertrophy. Resultant moderate spinal stenosis. Moderate bilateral L2 foraminal narrowing. L3-4:  Prior PLIF.  No residual stenosis. L4-5:  Prior PLIF.  No residual stenosis. L5-S1: Chronic 7 mm anterolisthesis with prior PLIF. No significant residual spinal stenosis. Probable residual mild to moderate foraminal narrowing largely due to slippage. IMPRESSION: MRI CERVICAL SPINE IMPRESSION: 1. No evidence for osteomyelitis discitis within the cervical spine. No epidural abscess or other abnormality. 2. Partially visualized collections/abscesses involving the left upper chest wall, better seen on recent CT examinations. No extension to involve the underlying spinal canal or vertebral column. 3. Multilevel degenerative spondylosis with resultant mild to moderate spinal stenosis at C3-4 through C6-7 as above. Severe right worse than left C5 and C7 foraminal stenosis. MRI THORACIC SPINE IMPRESSION: 1. Findings consistent with acute osteomyelitis involving the T3, T4, T5, and T6 vertebral bodies, with probable involvement of the anterior aspect of the T10 and T11 vertebral bodies. No convincing evidence for intervening discitis at this time. 2. Mild marrow edema and enhancement involving the right T3-4 and T4-5 facets, consistent with concomitant septic arthritis. 3. Enhancement involving the ventral epidural space at T3 through T6, consistent with mild/early epidural involvement. No frank epidural abscess. 4. Partially visualized left chest wall abscesses/collections with bilateral pleural effusions. 5. Multilevel degenerative spondylosis with resultant mild to moderate diffuse spinal stenosis throughout the thoracic spine as detailed above. MRI LUMBAR SPINE IMPRESSION: 1. 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 No appreciable epidural involvement at this time. 2. Multifocal abscesses involving the bilateral psoas musculature, largest of  which on the right measures up to 3.7 cm. 3. Postoperative changes from prior PLIF at L3-4 through L5-S1 without residual spinal stenosis. Collection at the posterior laminectomy site felt to be most consistent with a benign postoperative seroma. 4. 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 Electronically Signed   By: Rise Mu M.D.   On: 02/15/2021 03:11   CT SHOULDER LEFT WO CONTRAST  Result Date: 02/14/2021 CLINICAL DATA:  Septic arthritis suspected, shoulder, xray done septic arthritis left shoulder EXAM: CT OF THE UPPER LEFT EXTREMITY WITHOUT CONTRAST TECHNIQUE: Multidetector CT imaging of the upper left extremity was performed according to the standard protocol. COMPARISON:  CT 02/09/2021 FINDINGS: Bones/Joint/Cartilage There is no evidence of acute fracture. There is no new osseous erosion, destruction, or periosteal reaction. Persisting gas containing glenohumeral joint effusion and distention of the subacromial-subdeltoid bursa. Some of this gas/fluid is collecting in the subcoracoid recess. Ligaments Suboptimally assessed by CT. Muscles and Tendons There is persisting gas and fluid seen along the left upper chest wall/axilla, with most superior extent extending up the neck to the level of C6-C7 (axial image 3). There is persistent foci of intercostal gas (axial image 102/105). There is paraspinal muscle gas is well (axial image 82 and 51). Discrete abscesses are difficult to measure, with most well-defined area measuring 4.0 x 1.9 cm in the transverse plane by 7.5 cm in craniocaudal extent. Soft tissues As above. Left axillary lymphadenopathy. Small left pleural effusion. There is surgical material, likely packing along the left posterolateral  soft tissues on axial image 112). IMPRESSION: Persistent, extensive abscess along the left chest wall/axilla, with visible gas/fluid extending superiorly in the neck to the level of C6/C7, and  posteriorly to the paraspinal musculature. Most well-defined collection is within the axilla measuring 4.0 x 1.9 cm in the transverse plane and 7.5 cm in craniocaudal extent. Gas containing left shoulder joint effusion and distension of the subacromial-subdeltoid bursa, concerning for septic arthritis/bursitis, though these findings could in part be related to recent surgery. No new osseous abnormality to suggest osteomyelitis, though note CT is insensitive in detecting osteomyelitis. Electronically Signed   By: Caprice Renshaw   On: 02/14/2021 07:34   CT SHOULDER RIGHT WO CONTRAST  Result Date: 02/14/2021 CLINICAL DATA:  Septic arthritis suspected, shoulder, xray done septic arthritis right shoulder EXAM: CT OF THE UPPER RIGHT EXTREMITY WITHOUT CONTRAST TECHNIQUE: Multidetector CT imaging of the upper right extremity was performed according to the standard protocol. COMPARISON:  Right shoulder MRI 02/07/2021, CT 02/01/2021 FINDINGS: Bones/Joint/Cartilage Postsurgical changes of the right shoulder, including AC joint resection. There is soft tissue gas in fluid anteriorly, likely surgical related. There a small glenohumeral joint effusion and small amount of fluid in the subacromial-subdeltoid bursa. Ligaments Suboptimally assessed by CT. Muscles and Tendons Findings of full-thickness cuff tear. Soft tissues There is soft tissue swelling along the shoulder. In subcutaneous edema extending down the arm. IMPRESSION: Postsurgical changes at the right shoulder including resection at the [facility] joint. Soft tissue gas and fluid anteriorly, likely related to recent surgery. Small glenohumeral joint effusion with mild distention of the subacromial-subdeltoid bursa. There is soft tissue edema without no well-defined soft tissue abscess along the right shoulder. No new osseous changes to suggest osteomyelitis, though note CT is insensitive in detecting osteomyelitis. Electronically Signed   By: Caprice Renshaw   On: 02/14/2021 07:44    DG CHEST PORT 1 VIEW  Result Date: 02/13/2021 CLINICAL DATA:  Respiratory compromise EXAM: PORTABLE CHEST 1 VIEW COMPARISON:  02/10/2021 FINDINGS: Low lung volumes. Hazy atelectasis at the bases. Small left pleural effusion. Stable cardiomediastinal silhouette. No pneumothorax. IMPRESSION: Low lung volumes with small left effusion and hazy atelectasis at the bases Electronically Signed   By: Jasmine Pang M.D.   On: 02/13/2021 23:28    Assessment/Plan: Bilateral shoulder septic arthritis with bilateral psoas abscesses - L shoulder irrigation and debridement 6/16 - R shoulder irrigation and debridement with distal clavicle resection 6/17 -dressings changed, continue daily dressing changes - WBAT bilateral shoulders, can use slings for comfort if he likes - repeat CT scans taken yesterday and MRI of back taken today show persistent abscess of left chest wall/axilla as well as psoas abscesses and osteomyelitis discitis at L1-2, and L2-3 levels - have consulted IR this morning regarding possibility of drain placement for persistent left chest wall abscess and psoas abscesses  Contact information:   Weekdays 8-5 Janine Ores, PA-C [phone] A fter hours and holidays please check [email or web address] for group call information for [facility]  Armida Sans 02/15/2021, 12:47 PM

## 2021-02-15 NOTE — Progress Notes (Signed)
ID Brief Note   Patient continues to be febrile  MRI CTL spine reviewed Would recommend Neurosurgery eval vs IR eval for surgical intervention/drainage for better source control Continue cefazolin as is Monitor CBC and BMP    Odette Fraction, MD Infectious Disease Physician [facility] [address] [place], [place] [address] Phone: [phone]  Fax: [phone]

## 2021-02-15 NOTE — Progress Notes (Signed)
Patient have another fever-102.9, which he has been having since admission, PRN tylenol given. Heart rate 117 as well, prompting Red MEWs. J. Daniels-NP notified, no new order given. Will continue to assess patient.   02/14/21 2207  Assess: MEWS Score  Temp (!) 102.9 F (39.4 C)  BP 126/62  Pulse Rate (!) 117  Resp 18  Level of Consciousness Alert  SpO2 93 %  O2 Device Room Air  Assess: MEWS Score  MEWS Temp 2  MEWS Systolic 0  MEWS Pulse 2  MEWS RR 0  MEWS LOC 0  MEWS Score 4  MEWS Score Color Red  Assess: if the MEWS score is Yellow or Red  Were vital signs taken at a resting state? Yes  Focused Assessment No change from prior assessment  Does the patient meet 2 or more of the SIRS criteria? Yes  Does the patient have a confirmed or suspected source of infection? Yes  Provider and Rapid Response Notified? Yes  MEWS guidelines implemented *See Row Information* Yes (Patient have been runing fever since admission)  Treat  MEWS Interventions Administered scheduled meds/treatments  Pain Scale 0-10  Pain Score 7  Pain Type Acute pain;Chronic pain  Pain Location Back  Pain Orientation Lower  Pain Descriptors / Indicators Discomfort  Pain Frequency Constant  Pain Onset On-going  Patients Stated Pain Goal 3  Pain Intervention(s) Medication (See eMAR);Emotional support  Multiple Pain Sites No  Interventions Medication (see MAR)  Notify: Charge Nurse/RN  Name of Charge Nurse/RN Notified Rosary Lively, RN  Date Charge Nurse/RN Notified 02/14/21  Time Charge Nurse/RN Notified 2207  Notify: Provider  Provider Name/Title J. Daniels-NP  Date Provider Notified 02/14/21  Time Provider Notified 2207  Notification Type Page  Notification Reason Other (Comment) (Elevated temo)  Provider response No new orders  Assess: SIRS CRITERIA  SIRS Temperature  1  SIRS Pulse 1  SIRS Respirations  0  SIRS WBC 0  SIRS Score Sum  2

## 2021-02-15 NOTE — Consult Note (Signed)
Chief Complaint: Patient was seen in consultation today for left neck/supraclavicular fossa fluid collection/aspiration and drainage.  Referring Physician(s): Armida Sans (orthopedics)  Supervising Physician: Simonne Come  Patient Status: [facility] - In-pt  History of Present Illness: John Ware is a 55 y.o. male with a past medical history of hypertension, diabetes mellitus, spinal stenosis, and depression. He has been admitted to [facility] since 01/31/2021 for management of sepsis secondary to left chest wall fluid collection, along with bilateral shoulder pain. He had this fluid collection aspirated in IR 02/02/2021. Orthopedics was consulted and patient underwent left shoulder/joint debridement along with biceps tenotomy and excision of necrotic rotator cuff in OR 02/08/2021 by Dr. Dion Saucier due to septic left shoulder/scapula. Hospital course complicated by MSSA bacteremia, endocarditis, and large left lateral chest wall fluid collection despite IR aspiration. General surgery was consulted and patient underwent left lateral chest wall/axilla abscess I&D/debridement in OR by Dr. Andrey Campanile 02/10/2021, along with right shoulder debridement in OR 02/10/2021 by Dr. Dion Saucier due to septic right shoulder. Despite all of the above, further imaging revealed left neck/supraclavicular fossa fluid collection along with bilateral psoas muscle fluid collections, all concerning for additional abscesses.  MR cervical/thoracic/lumbar spine 02/14/2021: 1. No evidence for osteomyelitis discitis within the cervical spine. No epidural abscess or other abnormality. 2. Partially visualized collections/abscesses involving the left upper chest wall, better seen on recent CT examinations. No extension to involve the underlying spinal canal or vertebral column. 3. Multilevel degenerative spondylosis with resultant mild to moderate spinal stenosis at C3-4 through C6-7 as above. Severe right worse than left C5 and C7 foraminal  stenosis. 4. Findings consistent with acute osteomyelitis involving the T3, T4, T5, and T6 vertebral bodies, with probable involvement of the anterior aspect of the T10 and T11 vertebral bodies. No convincing evidence for intervening discitis at this time. 5. Mild marrow edema and enhancement involving the right T3-4 and T4-5 facets, consistent with concomitant septic arthritis. 6. Enhancement involving the ventral epidural space at T3 through T6, consistent with mild/early epidural involvement. No frank epidural abscess. 7. Partially visualized left chest wall abscesses/collections with bilateral pleural effusions. 8. Multilevel degenerative spondylosis with resultant mild to moderate diffuse spinal stenosis throughout the thoracic spine as detailed above. 9. 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 No appreciable epidural involvement at this time. 10. Multifocal abscesses involving the bilateral psoas musculature, largest of which on the right measures up to 3.7 cm. 11. Postoperative changes from prior PLIF at L3-4 through L5-S1 without residual spinal stenosis. Collection at the posterior laminectomy site felt to be most consistent with a benign postoperative seroma. 12. 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  CT left shoulder 02/13/2021: 1. Persistent, extensive abscess along the left chest wall/axilla, with visible gas/fluid extending superiorly in the neck to the level of C6/C7, and posteriorly to the paraspinal musculature. Most well-defined collection is within the axilla measuring 4.0 x 1.9 cm in the transverse plane and 7.5 cm in craniocaudal extent. 2. Gas containing left shoulder joint effusion and distension of the subacromial-subdeltoid bursa, concerning for septic arthritis/bursitis, though these findings could in part be related to recent surgery. 3. No new osseous abnormality to suggest  osteomyelitis, though note CT is insensitive in detecting osteomyelitis.  IR consulted by Janine Ores, PA-C for possible image-guided aspiration/drainage of left neck/supraclavicular fluid collection and bilateral psoas muscle fluid collections. Patient awake and alert laying in bed watching TV. Complains of  left chest pain at site of wound vac rated 3/10 at this time. States only has bilateral shoulder pain with movement (none at rest). Denies fever, chills, chest pain, dyspnea, abdominal pain, or headache.  LD SQ Lovenox 02/14/2021 at 0947.   Past Medical History:  Diagnosis Date   Depression    Diabetes mellitus without complication (HCC)    Hypertension    Spinal stenosis    With Neurogenic Claudication    Past Surgical History:  Procedure Laterality Date   CARPAL TUNNEL RELEASE Bilateral    IR US GUIDE BX ASP/DRAIN  02/02/2021   IRRIGATION AND DEBRIDEMENT ABSCESS Left 02/08/2021   Procedure: MINOR INCISION AND DRAINAGE OF ABSCESS;  Surgeon: [name], MD;  Location: [facility] ORS;  Service: Orthopedics;  Laterality: Left;   IRRIGATION AND DEBRIDEMENT ABSCESS Left 02/10/2021   Procedure: IRRIGATION AND DEBRIDEMENT ABSCESS LEFT LATERAL CHEST WALL;  Surgeon: [name], MD;  Location: [facility] ORS;  Service: General;  Laterality: Left;   LUMBAR LAMINECTOMY/DECOMPRESSION MICRODISCECTOMY N/A 12/13/2014   Procedure: LUMBAR THREE-FOUR, LUMBAR FOUR-FIVE, LUMBAR FIVE-SACRAL ONE LAMINECTOMY;  Surgeon: [name], MD;  Location: [facility]UR[facility] OR[facility];  Service: Neurosurgery;  Laterality: N/A;  L3-4 L4-5 L5-S1 Laminectomy   MENISCUS REPAIR     Right knee   TEE WITHOUT CARDIOVERSION N/A 02/05/2021   Procedure: TRANSESOPHAGEAL ECHOCARDIOGRAM (TEE);  Surgeon: Quintella Reichert, MD;  Location: [facility] ENDOSCOPY;  Service: Cardiovascular;  Laterality: N/A;   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 Authorizing Provider  acetaminophen (TYLENOL) 325 MG tablet Take 650-975 mg by mouth every 6 (six) hours as needed for mild pain, fever or headache.   Yes [provider]  amLODipine (NORVASC) 5 MG tablet Take 1 tablet (5 mg total) by mouth daily. 06/23/20  Yes Hawks, Christy A, FNP  buPROPion (WELLBUTRIN XL) 150 MG 24 hr tablet TAKE (1) TABLET TWICE A DAY. Patient taking differently: Take 150 mg by mouth 2 (two) times daily. 01/19/21  Yes Hawks, Christy A, FNP  busPIRone (BUSPAR) 5 MG tablet TAKE 1 TABLET 3 TIMES A DAY Patient taking differently: Take 5 mg by mouth 3 (three) times daily. 11/17/20  Yes Hawks, Christy A, FNP  gabapentin (NEURONTIN) 800 MG tablet TAKE (1) TABLET THREE TIMES DAILY. Patient taking differently: Take 800 mg by mouth 3 (three) times daily. 01/19/21  Yes Jannifer Rodney A, FNP  metFORMIN (GLUCOPHAGE XR) 750 MG 24 hr tablet Take 2 tablets (1,500 mg total) by mouth daily with breakfast. 06/23/20 12/20/20 Yes Hawks, Christy A, FNP  methocarbamol (ROBAXIN) 500 MG tablet Take 1 tablet (500 mg total) by mouth 2 (two) times daily as needed for muscle spasms. 01/20/21  Yes Caccavale, Sophia, PA-C  oxyCODONE ER (XTAMPZA ER) 18 MG C12A Take 1 tablet by mouth every 12 (twelve) hours. 11/26/19  Yes [provider]  oxyCODONE-acetaminophen (PERCOCET) 10-325 MG tablet Take 1 tablet by mouth 3 (three) times daily as needed for pain. 01/25/21  Yes [provider]  ramipril (ALTACE) 10 MG capsule TAKE (1) CAPSULE DAILY Patient taking differently: Take 10 mg by mouth daily. 06/23/20  Yes Junie Spencer, FNP  sertraline (ZOLOFT) 100 MG tablet Take 1 tablet (100 mg total) by mouth daily. 06/23/20  Yes Hawks, Christy A, FNP  Vitamin D,  Ergocalciferol, (DRISDOL) 1.25 MG (50000 UNIT) CAPS capsule Take 50,000 Units by mouth once a week. 01/25/21  Yes [provider]  diclofenac Sodium (VOLTAREN) 1 % GEL Apply 2 g topically 4 (four) times daily. Patient not taking: Reported on 01/31/2021 01/20/21   Caccavale, Sophia, PA-C  empagliflozin (JARDIANCE) 10 MG TABS tablet Take 1 tablet (10 mg total) by mouth daily before breakfast. Patient not taking: Reported on 01/31/2021 10/10/20   Junie Spencer, FNP  atorvastatin (LIPITOR) 20 MG tablet Take 1 tablet (20 mg total) by mouth daily. Patient not taking: Reported on 06/23/2020 02/29/20 08/23/20  Junie Spencer, FNP     Family History  Problem Relation Age of Onset   Colon cancer Neg Hx     Social History   Socioeconomic History   Marital status: Married    Spouse name: Not on file   Number of children: Not on file   Years of education: Not on file   Highest education level: Not on file  Occupational History   Not on file  Tobacco Use   Smoking status: Former    Packs/day: 2.00    Years: 15.00    Pack years: 30.00    Types: Cigarettes    Quit date: 2012    Years since quitting: 10.4   Smokeless tobacco: Never  Vaping Use   Vaping Use: Never used  Substance and Sexual Activity   Alcohol use: Yes    Comment: occasional   Drug use: No   Sexual activity: Not on file  Other Topics Concern   Not on file  Social History Narrative   Not on file   Social Determinants of Health   Financial Resource Strain: Not on file  Food Insecurity: Not on file  Transportation Needs: Not on file  Physical Activity: Not on file  Stress: Not on file  Social Connections: Not on file     Review of Systems: A 12 point ROS discussed and pertinent positives are indicated in the HPI above.  All other systems are negative.  Review of Systems  Constitutional:  Negative for chills and fever.  Respiratory:  Negative for shortness of breath and wheezing.   Cardiovascular:  Negative  for chest pain and palpitations.  Gastrointestinal:  Negative for abdominal pain.  Neurological:  Negative for headaches.  Psychiatric/Behavioral:  Negative for confusion.    Vital Signs: BP 105/69   Pulse 92   Temp 98.8 F (37.1 C)   Resp (!) 22   Ht 5\' 10"  (1.778 m)   Wt 214 lb 8.1 oz (97.3 kg)   SpO2 95%   BMI 30.78 kg/m   Physical Exam Vitals and nursing note reviewed.  Constitutional:      General: He is not in acute distress. Cardiovascular:     Rate and Rhythm: Normal rate and regular rhythm.     Heart sounds: Normal heart sounds. No murmur heard. Pulmonary:     Effort: Pulmonary effort is normal. No respiratory distress.     Breath sounds: Normal breath sounds. No wheezing.     Comments: (+) left chest wound vac in place. Musculoskeletal:     Comments: (+) bilateral anterior shoulder incisions with dressings intact. No tenderness to palpation of left neck/supraclavicular fossa.  Skin:    General: Skin is warm and dry.  Neurological:  Mental Status: He is alert and oriented to person, place, and time.     MD Evaluation Airway: WNL Heart: WNL Abdomen: WNL Chest/ Lungs: WNL ASA  Classification: 3 Mallampati/Airway Score: Two   Imaging: DG Shoulder Right  Result Date: 02/01/2021 CLINICAL DATA:  Bilateral shoulder pain without known injury. EXAM: RIGHT SHOULDER - 2+ VIEW COMPARISON:  None. FINDINGS: There is no evidence of fracture or dislocation. There is no evidence of arthropathy or other focal bone abnormality. Soft tissues are unremarkable. IMPRESSION: Negative. Electronically Signed   By: Lupita Raider M.D.   On: 02/01/2021 16:44   DG Knee 1-2 Views Right  Result Date: 02/08/2021 CLINICAL DATA:  Right knee pain EXAM: RIGHT KNEE - 2 VIEW COMPARISON:  None. FINDINGS: Medial joint space narrowing is noted with mild osteophytic change. Mild patellofemoral changes are noted as well. No joint effusion is seen. No acute fracture is noted. IMPRESSION: Mild  degenerative change without acute abnormality. Electronically Signed   By: Alcide Clever M.D.   On: 02/08/2021 09:30   CT CHEST WO CONTRAST  Result Date: 02/13/2021 CLINICAL DATA:  History of shoulder infection. EXAM: CT CHEST WITHOUT CONTRAST TECHNIQUE: Multidetector CT imaging of the chest was performed following the standard protocol without IV contrast. COMPARISON:  CT chest 02/09/2021 FINDINGS: Cardiovascular: The heart size is normal. No substantial pericardial effusion. No thoracic aortic aneurysm. Mediastinum/Nodes: No mediastinal lymphadenopathy. No evidence for gross hilar lymphadenopathy although assessment is limited by the lack of intravenous contrast on today's study. The esophagus has normal imaging features. Bulky lymphadenopathy is again identified in the left axilla. Lungs/Pleura: 7 mm subpleural left upper lobe pulmonary nodule on 71/7 is similar to prior. 6 mm nodule in the medial left upper lobe (91/7) and 12 mm nodule in the posterior left upper lobe are similar to prior. Bibasilar collapse/consolidation is not substantially changed in the interval. Probable tiny bilateral effusions. Upper Abdomen: Unremarkable. Musculoskeletal: Soft tissue gas and fluid is identified in the left supraclavicular region, around the left shoulder, and in the lateral and posterior left chest wall. Fluid and gas in the supraclavicular region tracking posteriorly into the subscapular space is similar to prior. Diffuse edema noted in the muscles of the rotator cuff. Surgical drain noted posterolateral lower left thoracic wall with interval decrease in soft tissue gas and swelling in this region. There is an open wound in the region of the cervical drain. Gas and fluid are seen in the region of the right shoulder joint, with more gas visible on today's study than on the previous exam. Diffuse edema noted in the muscles of the rotator cuff. IMPRESSION: 1. Interval drain placement in the lateral and posterolateral  left chest wall with decrease in the volume of gas and debris in the chest wall. 2. Similar gas in fluid in the left supraclavicular space and region of the left shoulder joint with diffuse edema of the rotator cuff muscles. 3. Gas is now visible in the soft tissues anterior to the right shoulder with similar appearance of fluid and edema in the soft tissues and muscles around the right shoulder. Electronically Signed   By: Kennith Center M.D.   On: 02/13/2021 17:37   CT CHEST WO CONTRAST  Result Date: 02/09/2021 CLINICAL DATA:  LEFT posterior shoulder/axillary abscess post I & D, leukocytosis EXAM: CT CHEST WITHOUT CONTRAST TECHNIQUE: Multidetector CT imaging of the chest was performed following the standard protocol without IV contrast. Sagittal and coronal MPR images reconstructed from axial data set.  COMPARISON:  CT chest 02/01/2021 FINDINGS: Cardiovascular: Aorta normal caliber. Heart size normal. No pericardial effusion. Mediastinum/Nodes: Esophagus unremarkable. Base of cervical region normal appearance. Enlarged LEFT axillary lymph nodes identified. No additional intrathoracic adenopathy. Lungs/Pleura: Small BILATERAL pleural effusions with compressive atelectasis of the lower lobes. LEFT lower lobe nodule 10 mm diameter unchanged. Few calcified granulomata bilaterally. Mild central peribronchial thickening. No infiltrate or pneumothorax. Additional lingular nodule 5 mm diameter image 85 unchanged. Subpleural nodule anterior LEFT upper lobe 6 mm diameter image 66 unchanged. Upper Abdomen: Within limitations of beam hardening artifacts from arms, upper abdomen unremarkable Musculoskeletal: Degenerative disc disease changes thoracic spine. Osseous structures intact. Complex collection LEFT axilla and chest wall, extends from anterior to posterior from above the LEFT apex through the inferior extent of the exam at the lateral upper abdomen. Eight collection contains gas and fluid consistent with abscess.  Foci of fluid extend subscapular, superior to the LEFT first and second ribs, and anteriorly anterior to the glenohumeral joint and coracoid process. The portion of the collection subscapular is similar to the previous exam, 10.4 x 5.8 cm image 11. The lateral chest wall collection has markedly increased and now measures 17 cm AP, 6.3 cm transverse, and at least 16.2 cm craniocaudal. Foci of gas are identified retropectoral and RIGHT deep to the deltoid muscle. Gas is seen extending adjacent to the coracoid process. No definite osseous destruction. IMPRESSION: Marked increase in size of abscess collection at the LEFT lateral chest wall with persistent abscess again seen subscapular extending cranial to the apex of the LEFT hemithorax as described above. Since patient is post I & D of the chest wall abscess, it is uncertain how much of the increase in size of the abscess is due to postsurgical changes versus progression of abscess collection Small BILATERAL pleural effusions and bibasilar atelectasis. Reactive LEFT axillary adenopathy. Electronically Signed   By: Ulyses Southward M.D.   On: 02/09/2021 08:55   MR BRAIN WO CONTRAST  Result Date: 02/04/2021 CLINICAL DATA:  Floaters in both eyes, MSSA bacteremia EXAM: MRI HEAD WITHOUT CONTRAST TECHNIQUE: Multiplanar, multiecho pulse sequences of the brain and surrounding structures were obtained without intravenous contrast. COMPARISON:  None. FINDINGS: Brain: There is a single punctate focus of diffusion hyperintensity in the high left frontal lobe along the precentral gyrus adjacent to the skull (series 5, image 89). This is not definitely identified on the coronal DWI. No evidence of intracranial hemorrhage. There is no intracranial mass, mass effect, or edema. There is no hydrocephalus or extra-axial fluid collection. Ventricles and sulci are within normal limits in size and configuration. Patchy foci of T2 hyperintensity in the supratentorial white matter nonspecific  but may reflect minor chronic microvascular ischemic changes. Vascular: Major vessel flow voids at the skull base are preserved. Skull and upper cervical spine: Normal marrow signal is preserved. Sinuses/Orbits: Trace paranasal sinus mucosal thickening. Orbits are unremarkable. Other: Sella is unremarkable. Minor right mastoid fluid opacification. IMPRESSION: Punctate acute or subacute infarct versus artifact in the high left frontal lobe. Otherwise, no evidence of recent infarction, hemorrhage, or mass. Minor chronic microvascular ischemic changes. Electronically Signed   By: Guadlupe Spanish M.D.   On: 02/04/2021 12:28   MR CERVICAL SPINE W WO CONTRAST  Result Date: 02/15/2021 CLINICAL DATA:  55 year old male with history of disseminated MSSA infection, evaluate for osteomyelitis discitis. EXAM: MRI CERVICAL, THORACIC AND LUMBAR SPINE WITHOUT AND WITH CONTRAST TECHNIQUE: Multiplanar and multiecho pulse sequences of the cervical spine, to include the craniocervical junction  and cervicothoracic junction, and thoracic and lumbar spine, were obtained without and with intravenous contrast. CONTRAST:  10mL GADAVIST GADOBUTROL 1 MMOL/ML IV SOLN COMPARISON:  Prior CT from 02/13/2021. FINDINGS: MRI CERVICAL SPINE FINDINGS Alignment: Examination somewhat technically limited as the patient was unable to complete the entirety of the exam. No postcontrast images of the cervical spine are provided. Mild straightening of the normal cervical lordosis.  No listhesis. Vertebrae: Vertebral body height maintained without acute or chronic fracture. Diffusely decreased T1 signal intensity seen throughout the visualized bone marrow, nonspecific, but most commonly related to anemia, smoking, or obesity. No discrete or worrisome osseous lesions. No findings to suggest osteomyelitis discitis or septic arthritis within the cervical spine. Cord: Normal signal and morphology. No epidural abscess or other collection. Posterior Fossa,  vertebral arteries, paraspinal tissues: Partially empty sella noted. Visualized brain and posterior fossa otherwise unremarkable. Craniocervical junction normal. Known fluid collections/abscess involving the left chest wall are partially visualized. Largest loculated component seen at the left upper posterior chest and measures 5.3 x 1.6 cm (series 34, image 34). Additional separate and partially visualized loculated component position slightly laterally measures 4.3 x 1.6 cm (series 34, image 3). Scattered foci of internal susceptibility artifact consistent with gas. No extension to involve the underlying spinal canal or vertebral column. Associated scattered ill-defined edema noted within the musculature of the visualized upper posterior chest. Prevertebral soft tissues are within normal limits. Normal flow voids seen within the vertebral arteries bilaterally. Disc levels: C2-C3: Mild uncovertebral hypertrophy without significant disc bulge. Right worse than left facet hypertrophy. No spinal stenosis. Mild to moderate right C3 foraminal narrowing. Left neural foramina remains patent. C3-C4: Left paracentral disc protrusion with slight inferior migration indents the left ventral thecal sac (series 33, image 10). Mild flattening of the left ventral cord without cord signal changes. Mild spinal stenosis. Superimposed uncovertebral and facet hypertrophy with resultant mild to moderate right worse than left C4 foraminal stenosis. C4-C5: Disc desiccation. Broad-based central disc protrusion indents the ventral thecal sac, eccentric to the left (series 33, image 15). Superimposed uncovertebral and facet hypertrophy. Resultant mild to moderate spinal stenosis. Severe right worse than left C5 foraminal narrowing. C5-C6: Small right paracentral disc protrusion indents the ventral thecal sac (series 34, image 25). Mild spinal stenosis without cord deformity. Superimposed bilateral facet hypertrophy. Mild to moderate left  worse than right C6 foraminal narrowing. C6-C7: Disc desiccation with diffuse disc bulge. Superimposed central disc extrusion with inferior migration. Extruded disc material contacts and mildly flattens the ventral cord. No cord signal changes. Moderate spinal stenosis. Superimposed facet and uncovertebral hypertrophy with resultant severe right worse than left C7 foraminal stenosis. C7-T1: Small amount of extruded disc material extends from the C6-7 level above. No other disc pathology at this level. Right worse than left facet hypertrophy. No significant spinal stenosis. Mild right C8 foraminal narrowing. Left neural foramina remains patent. MRI THORACIC SPINE FINDINGS Alignment:  Examination technically limited by motion artifact. Normal alignment with preservation of the normal thoracic kyphosis. No listhesis. Vertebrae: Vertebral body height maintained without acute or chronic fracture. Bone marrow signal intensity diffusely decreased on T1 weighted imaging. 1.5 cm benign hemangioma noted within the T7 vertebral body. No worrisome osseous lesions. Abnormal marrow edema and enhancement seen involving the T3, T4, T5, and T6 vertebral bodies, suspicious for acute osteomyelitis. Probable involvement of the anterior aspects of the T10 and T11 vertebral bodies as well (series 37, image 4) no convincing evidence for intervening discitis at this time. Mild  marrow edema seen about the right T3-4 and T4-5 facets, suspected to reflect concomitant septic arthritis (series 37, image 2). Cord: No convincing cord signal abnormality seen on this motion degraded exam. Thin enhancement seen involving the ventral epidural space at T3 through T6, likely related to mild infection/phlegmon (series 37, image 6). Possible scattered dorsal involvement noted as well. No frank epidural abscess. Paraspinal and other soft tissues: Known left chest wall collections/abscesses are partially visualized, with largest component seen at the upper  left posterior chest measuring 11.8 x 1.9 cm (series 38, image 4). Mild paraspinous phlegmon/enhancement seen adjacent to the infected vertebral bodies, most pronounced at T3-4 and T4-5 (series 39, image 10, 15) no frank paraspinous abscess or collection. Mildly irregular layering bilateral pleural effusions, left greater than right partially visualized. Atelectasis and/or infiltrates noted within the partially visualized lungs. Disc levels: T1-2: Negative interspace. Bilateral facet hypertrophy. No stenosis. T2-3: Facet hypertrophy. Probable right foraminal disc protrusion. No significant spinal stenosis. Mild bilateral foraminal narrowing. T3-4: Negative interspace. Bilateral facet hypertrophy with possible septic arthritis on the right. No significant stenosis. T4-5: Central/right paracentral disc protrusion indents the ventral thecal sac. Flattening of the right ventral cord. Posterior element hypertrophy. Mild spinal stenosis. Foramina remain patent. T5-6: Right paracentral disc protrusion indents the right ventral thecal sac, contacting and flattening the right cord. Moderate spinal stenosis. Foramina remain patent. T6-7: Central/left paracentral disc extrusion with superior migration contacts and flattens the ventral cord. Posterior element hypertrophy with prominence of the epidural fat. Mild to moderate spinal stenosis. Foramina remain patent. T7-8: Shallow left paracentral disc protrusion. Prominence of the dorsal epidural fat with mild posterior element hypertrophy. Mild spinal stenosis. Foramina remain patent. T8-9: Small central irregular disc protrusion. Mild posterior element hypertrophy. Mild spinal stenosis. Foramina remain patent. T9-10: Left paracentral to foraminal disc protrusion. Posterior element hypertrophy. Mild spinal stenosis. Mild left neural foraminal narrowing. T10-11: Right paracentral disc protrusion with superior migration. Moderate facet hypertrophy. Mild flattening of the ventral  cord with mild-to-moderate spinal stenosis. Moderate bilateral foraminal narrowing. T11-12: Right paracentral disc extrusion with superior migration. Secondary flattening of the right ventral cord. Moderate facet hypertrophy. Resultant moderate canal with bilateral foraminal stenosis. T12-L1: Right paracentral disc protrusion. Moderate facet hypertrophy. No significant stenosis. MRI LUMBAR SPINE FINDINGS Segmentation:  Standard. Alignment: 7 mm anterolisthesis of L5 on S1. Trace retrolisthesis of L1 on L2 and L2 on L3. Vertebrae: Sequelae of prior PLIF at L3 through S1. Vertebral body height maintained without acute or chronic fracture. Diffusely decreased T1 signal intensity within the underlying bone marrow. There is abnormal edema involving the L2-3 disc, with marrow edema involving the adjacent L2 and L3 vertebral bodies, consistent with osteomyelitis discitis. Suspected additional involvement of the adjacent L1-2 disc as well. Conus medullaris and cauda equina: Conus extends to the L1 level. Conus and cauda equina appear normal. No visible significant epidural involvement or epidural abscess. Paraspinal and other soft tissues: Abnormal edema with multifocal abscesses seen involving the psoas muscles bilaterally. Largest of these seen on the right and measures 3.7 x 2.8 cm (series 28, image 30). Additional collection at the laminectomy site within the posterior soft tissues measures 2.9 x 2.1 x 5.9 cm, felt to be most consistent with a benign postoperative seroma. Additional chronic postoperative changes noted elsewhere within the posterior paraspinous soft tissues. No visible SI joint involvement. Disc levels: L1-2: Retrolisthesis. Intervertebral disc space narrowing with mild disc bulge. Moderate facet hypertrophy. No significant spinal stenosis. Mild bilateral L1 foraminal narrowing. L2-3: Trace  retrolisthesis. Mild disc bulge. Advanced facet and ligament flavum hypertrophy. Resultant moderate spinal  stenosis. Moderate bilateral L2 foraminal narrowing. L3-4:  Prior PLIF.  No residual stenosis. L4-5:  Prior PLIF.  No residual stenosis. L5-S1: Chronic 7 mm anterolisthesis with prior PLIF. No significant residual spinal stenosis. Probable residual mild to moderate foraminal narrowing largely due to slippage. IMPRESSION: MRI CERVICAL SPINE IMPRESSION: 1. No evidence for osteomyelitis discitis within the cervical spine. No epidural abscess or other abnormality. 2. Partially visualized collections/abscesses involving the left upper chest wall, better seen on recent CT examinations. No extension to involve the underlying spinal canal or vertebral column. 3. Multilevel degenerative spondylosis with resultant mild to moderate spinal stenosis at C3-4 through C6-7 as above. Severe right worse than left C5 and C7 foraminal stenosis. MRI THORACIC SPINE IMPRESSION: 1. Findings consistent with acute osteomyelitis involving the T3, T4, T5, and T6 vertebral bodies, with probable involvement of the anterior aspect of the T10 and T11 vertebral bodies. No convincing evidence for intervening discitis at this time. 2. Mild marrow edema and enhancement involving the right T3-4 and T4-5 facets, consistent with concomitant septic arthritis. 3. Enhancement involving the ventral epidural space at T3 through T6, consistent with mild/early epidural involvement. No frank epidural abscess. 4. Partially visualized left chest wall abscesses/collections with bilateral pleural effusions. 5. Multilevel degenerative spondylosis with resultant mild to moderate diffuse spinal stenosis throughout the thoracic spine as detailed above. MRI LUMBAR SPINE IMPRESSION: 1. 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 No appreciable epidural involvement at this time. 2. Multifocal abscesses involving the bilateral psoas musculature, largest of which on the right measures up to 3.7 cm. 3. Postoperative changes from prior PLIF at  L3-4 through L5-S1 without residual spinal stenosis. Collection at the posterior laminectomy site felt to be most consistent with a benign postoperative seroma. 4. 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 Electronically Signed   By: Rise Mu M.D.   On: 02/15/2021 03:11   MR THORACIC SPINE W WO CONTRAST  Result Date: 02/15/2021 CLINICAL DATA:  55 year old male with history of disseminated MSSA infection, evaluate for osteomyelitis discitis. EXAM: MRI CERVICAL, THORACIC AND LUMBAR SPINE WITHOUT AND WITH CONTRAST TECHNIQUE: Multiplanar and multiecho pulse sequences of the cervical spine, to include the craniocervical junction and cervicothoracic junction, and thoracic and lumbar spine, were obtained without and with intravenous contrast. CONTRAST:  10mL GADAVIST GADOBUTROL 1 MMOL/ML IV SOLN COMPARISON:  Prior CT from 02/13/2021. FINDINGS: MRI CERVICAL SPINE FINDINGS Alignment: Examination somewhat technically limited as the patient was unable to complete the entirety of the exam. No postcontrast images of the cervical spine are provided. Mild straightening of the normal cervical lordosis.  No listhesis. Vertebrae: Vertebral body height maintained without acute or chronic fracture. Diffusely decreased T1 signal intensity seen throughout the visualized bone marrow, nonspecific, but most commonly related to anemia, smoking, or obesity. No discrete or worrisome osseous lesions. No findings to suggest osteomyelitis discitis or septic arthritis within the cervical spine. Cord: Normal signal and morphology. No epidural abscess or other collection. Posterior Fossa, vertebral arteries, paraspinal tissues: Partially empty sella noted. Visualized brain and posterior fossa otherwise unremarkable. Craniocervical junction normal. Known fluid collections/abscess involving the left chest wall are partially visualized. Largest loculated component seen at the  left upper posterior chest and measures 5.3 x 1.6 cm (series 34, image 34). Additional separate and partially visualized loculated component position slightly laterally measures 4.3 x 1.6 cm (series  34, image 3). Scattered foci of internal susceptibility artifact consistent with gas. No extension to involve the underlying spinal canal or vertebral column. Associated scattered ill-defined edema noted within the musculature of the visualized upper posterior chest. Prevertebral soft tissues are within normal limits. Normal flow voids seen within the vertebral arteries bilaterally. Disc levels: C2-C3: Mild uncovertebral hypertrophy without significant disc bulge. Right worse than left facet hypertrophy. No spinal stenosis. Mild to moderate right C3 foraminal narrowing. Left neural foramina remains patent. C3-C4: Left paracentral disc protrusion with slight inferior migration indents the left ventral thecal sac (series 33, image 10). Mild flattening of the left ventral cord without cord signal changes. Mild spinal stenosis. Superimposed uncovertebral and facet hypertrophy with resultant mild to moderate right worse than left C4 foraminal stenosis. C4-C5: Disc desiccation. Broad-based central disc protrusion indents the ventral thecal sac, eccentric to the left (series 33, image 15). Superimposed uncovertebral and facet hypertrophy. Resultant mild to moderate spinal stenosis. Severe right worse than left C5 foraminal narrowing. C5-C6: Small right paracentral disc protrusion indents the ventral thecal sac (series 34, image 25). Mild spinal stenosis without cord deformity. Superimposed bilateral facet hypertrophy. Mild to moderate left worse than right C6 foraminal narrowing. C6-C7: Disc desiccation with diffuse disc bulge. Superimposed central disc extrusion with inferior migration. Extruded disc material contacts and mildly flattens the ventral cord. No cord signal changes. Moderate spinal stenosis. Superimposed facet and  uncovertebral hypertrophy with resultant severe right worse than left C7 foraminal stenosis. C7-T1: Small amount of extruded disc material extends from the C6-7 level above. No other disc pathology at this level. Right worse than left facet hypertrophy. No significant spinal stenosis. Mild right C8 foraminal narrowing. Left neural foramina remains patent. MRI THORACIC SPINE FINDINGS Alignment:  Examination technically limited by motion artifact. Normal alignment with preservation of the normal thoracic kyphosis. No listhesis. Vertebrae: Vertebral body height maintained without acute or chronic fracture. Bone marrow signal intensity diffusely decreased on T1 weighted imaging. 1.5 cm benign hemangioma noted within the T7 vertebral body. No worrisome osseous lesions. Abnormal marrow edema and enhancement seen involving the T3, T4, T5, and T6 vertebral bodies, suspicious for acute osteomyelitis. Probable involvement of the anterior aspects of the T10 and T11 vertebral bodies as well (series 37, image 4) no convincing evidence for intervening discitis at this time. Mild marrow edema seen about the right T3-4 and T4-5 facets, suspected to reflect concomitant septic arthritis (series 37, image 2). Cord: No convincing cord signal abnormality seen on this motion degraded exam. Thin enhancement seen involving the ventral epidural space at T3 through T6, likely related to mild infection/phlegmon (series 37, image 6). Possible scattered dorsal involvement noted as well. No frank epidural abscess. Paraspinal and other soft tissues: Known left chest wall collections/abscesses are partially visualized, with largest component seen at the upper left posterior chest measuring 11.8 x 1.9 cm (series 38, image 4). Mild paraspinous phlegmon/enhancement seen adjacent to the infected vertebral bodies, most pronounced at T3-4 and T4-5 (series 39, image 10, 15) no frank paraspinous abscess or collection. Mildly irregular layering bilateral  pleural effusions, left greater than right partially visualized. Atelectasis and/or infiltrates noted within the partially visualized lungs. Disc levels: T1-2: Negative interspace. Bilateral facet hypertrophy. No stenosis. T2-3: Facet hypertrophy. Probable right foraminal disc protrusion. No significant spinal stenosis. Mild bilateral foraminal narrowing. T3-4: Negative interspace. Bilateral facet hypertrophy with possible septic arthritis on the right. No significant stenosis. T4-5: Central/right paracentral disc protrusion indents the ventral thecal sac. Flattening of the  right ventral cord. Posterior element hypertrophy. Mild spinal stenosis. Foramina remain patent. T5-6: Right paracentral disc protrusion indents the right ventral thecal sac, contacting and flattening the right cord. Moderate spinal stenosis. Foramina remain patent. T6-7: Central/left paracentral disc extrusion with superior migration contacts and flattens the ventral cord. Posterior element hypertrophy with prominence of the epidural fat. Mild to moderate spinal stenosis. Foramina remain patent. T7-8: Shallow left paracentral disc protrusion. Prominence of the dorsal epidural fat with mild posterior element hypertrophy. Mild spinal stenosis. Foramina remain patent. T8-9: Small central irregular disc protrusion. Mild posterior element hypertrophy. Mild spinal stenosis. Foramina remain patent. T9-10: Left paracentral to foraminal disc protrusion. Posterior element hypertrophy. Mild spinal stenosis. Mild left neural foraminal narrowing. T10-11: Right paracentral disc protrusion with superior migration. Moderate facet hypertrophy. Mild flattening of the ventral cord with mild-to-moderate spinal stenosis. Moderate bilateral foraminal narrowing. T11-12: Right paracentral disc extrusion with superior migration. Secondary flattening of the right ventral cord. Moderate facet hypertrophy. Resultant moderate canal with bilateral foraminal stenosis. T12-L1:  Right paracentral disc protrusion. Moderate facet hypertrophy. No significant stenosis. MRI LUMBAR SPINE FINDINGS Segmentation:  Standard. Alignment: 7 mm anterolisthesis of L5 on S1. Trace retrolisthesis of L1 on L2 and L2 on L3. Vertebrae: Sequelae of prior PLIF at L3 through S1. Vertebral body height maintained without acute or chronic fracture. Diffusely decreased T1 signal intensity within the underlying bone marrow. There is abnormal edema involving the L2-3 disc, with marrow edema involving the adjacent L2 and L3 vertebral bodies, consistent with osteomyelitis discitis. Suspected additional involvement of the adjacent L1-2 disc as well. Conus medullaris and cauda equina: Conus extends to the L1 level. Conus and cauda equina appear normal. No visible significant epidural involvement or epidural abscess. Paraspinal and other soft tissues: Abnormal edema with multifocal abscesses seen involving the psoas muscles bilaterally. Largest of these seen on the right and measures 3.7 x 2.8 cm (series 28, image 30). Additional collection at the laminectomy site within the posterior soft tissues measures 2.9 x 2.1 x 5.9 cm, felt to be most consistent with a benign postoperative seroma. Additional chronic postoperative changes noted elsewhere within the posterior paraspinous soft tissues. No visible SI joint involvement. Disc levels: L1-2: Retrolisthesis. Intervertebral disc space narrowing with mild disc bulge. Moderate facet hypertrophy. No significant spinal stenosis. Mild bilateral L1 foraminal narrowing. L2-3: Trace retrolisthesis. Mild disc bulge. Advanced facet and ligament flavum hypertrophy. Resultant moderate spinal stenosis. Moderate bilateral L2 foraminal narrowing. L3-4:  Prior PLIF.  No residual stenosis. L4-5:  Prior PLIF.  No residual stenosis. L5-S1: Chronic 7 mm anterolisthesis with prior PLIF. No significant residual spinal stenosis. Probable residual mild to moderate foraminal narrowing largely due to  slippage. IMPRESSION: MRI CERVICAL SPINE IMPRESSION: 1. No evidence for osteomyelitis discitis within the cervical spine. No epidural abscess or other abnormality. 2. Partially visualized collections/abscesses involving the left upper chest wall, better seen on recent CT examinations. No extension to involve the underlying spinal canal or vertebral column. 3. Multilevel degenerative spondylosis with resultant mild to moderate spinal stenosis at C3-4 through C6-7 as above. Severe right worse than left C5 and C7 foraminal stenosis. MRI THORACIC SPINE IMPRESSION: 1. Findings consistent with acute osteomyelitis involving the T3, T4, T5, and T6 vertebral bodies, with probable involvement of the anterior aspect of the T10 and T11 vertebral bodies. No convincing evidence for intervening discitis at this time. 2. Mild marrow edema and enhancement involving the right T3-4 and T4-5 facets, consistent with concomitant septic arthritis. 3. Enhancement involving the ventral  epidural space at T3 through T6, consistent with mild/early epidural involvement. No frank epidural abscess. 4. Partially visualized left chest wall abscesses/collections with bilateral pleural effusions. 5. Multilevel degenerative spondylosis with resultant mild to moderate diffuse spinal stenosis throughout the thoracic spine as detailed above. MRI LUMBAR SPINE IMPRESSION: 1. 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 No appreciable epidural involvement at this time. 2. Multifocal abscesses involving the bilateral psoas musculature, largest of which on the right measures up to 3.7 cm. 3. Postoperative changes from prior PLIF at L3-4 through L5-S1 without residual spinal stenosis. Collection at the posterior laminectomy site felt to be most consistent with a benign postoperative seroma. 4. 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 Electronically Signed   By: Rise Mu M.D.   On: 02/15/2021 03:11   MR THORACIC SPINE W WO CONTRAST  Result Date: 02/01/2021 CLINICAL DATA:  Thoracic region back pain. Recent fall. Question epidural abscess. Bacteremia. EXAM: MRI THORACIC WITHOUT AND WITH CONTRAST TECHNIQUE: Multiplanar and multiecho pulse sequences of the thoracic spine were obtained without and with intravenous contrast. CONTRAST:  9mL GADAVIST GADOBUTROL 1 MMOL/ML IV SOLN COMPARISON:  None. FINDINGS: Alignment:  No abnormal alignment. Vertebrae: In general, the vertebral bodies show loss of height from T5 through T12 is could possibly be seen with chronic osteopenia. No evidence of a focal or recent compression fracture. No sign of bone infection. Cord:  See below regarding stenosis. Paraspinal and other soft tissues: Extensive T2 bright material in the left upper chest wall in the supraclavicular region and between the ribcage and the scapula, with peripheral enhancement. This could be sterile material related to hematoma or could be an infection, either a primary infection or a superinfected hematoma. Consider chest CT for complete evaluation. Disc levels: T1-2 and T2-3: Normal T3-4: Small left posterolateral disc protrusion but without apparent neural compression. T4-5: Congenital spinal stenosis. Chronic appearing central disc herniation. Facet and ligamentous hypertrophy. Spinal stenosis with some cord deformity. T5-6: Congenital spinal stenosis. Chronic appearing broad-based disc herniation more prominent towards the right. Facet and ligamentous hypertrophy. Severe spinal stenosis with cord deformity. T6-7: Congenital spinal stenosis. Chronic appearing broad-based disc herniation. Facet and ligamentous hypertrophy. No visible compressive effect upon the cord. T7-8: Congenital spinal stenosis. Chronic appearing broad-based disc herniation more prominent towards the left. Facet and ligamentous hypertrophy. Some deformity of the ventral cord. T8-9: Congenital spinal  stenosis. Broad-based disc herniation which could be relatively more recent. Facet and ligamentous prominence. Slight: Indentation of cord. T9-10 congenital spinal stenosis. Chronic appearing left posterolateral disc herniation with mild mass-effect upon the left side of the cord. T10-11: Congenital spinal stenosis. Chronic appearing shallow protrusion of the disc. Facet and ligamentous hypertrophy. Mild deformity of the cord. T11-12: Congenital spinal stenosis. Chronic appearing disc protrusion more prominent towards the right. Facet and ligamentous prominence. Some deformity of the cord. T12-L1: Chronic appearing bilateral posterolateral disc herniations that indent the thecal sac. No distinct neural compression. IMPRESSION: Fluid collection in the left upper posterior chest wall as outlined above that could be sterile hematoma, infected hematoma or abscess on related to hematoma. Consider chest CT with contrast for better evaluation of extent. 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 Stenosis with some cord deformity which could be significant in the region from T4-5 through T11-12 as outlined above. No evidence infection involving the spine or spinal canal. Electronically Signed   By:  Paulina Fusi M.D.   On: 02/01/2021 14:20   MR Lumbar Spine W Wo Contrast  Result Date: 02/15/2021 CLINICAL DATA:  55 year old male with history of disseminated MSSA infection, evaluate for osteomyelitis discitis. EXAM: MRI CERVICAL, THORACIC AND LUMBAR SPINE WITHOUT AND WITH CONTRAST TECHNIQUE: Multiplanar and multiecho pulse sequences of the cervical spine, to include the craniocervical junction and cervicothoracic junction, and thoracic and lumbar spine, were obtained without and with intravenous contrast. CONTRAST:  66mL GADAVIST GADOBUTROL 1 MMOL/ML IV SOLN COMPARISON:  Prior CT from 02/13/2021. FINDINGS: MRI CERVICAL SPINE  FINDINGS Alignment: Examination somewhat technically limited as the patient was unable to complete the entirety of the exam. No postcontrast images of the cervical spine are provided. Mild straightening of the normal cervical lordosis.  No listhesis. Vertebrae: Vertebral body height maintained without acute or chronic fracture. Diffusely decreased T1 signal intensity seen throughout the visualized bone marrow, nonspecific, but most commonly related to anemia, smoking, or obesity. No discrete or worrisome osseous lesions. No findings to suggest osteomyelitis discitis or septic arthritis within the cervical spine. Cord: Normal signal and morphology. No epidural abscess or other collection. Posterior Fossa, vertebral arteries, paraspinal tissues: Partially empty sella noted. Visualized brain and posterior fossa otherwise unremarkable. Craniocervical junction normal. Known fluid collections/abscess involving the left chest wall are partially visualized. Largest loculated component seen at the left upper posterior chest and measures 5.3 x 1.6 cm (series 34, image 34). Additional separate and partially visualized loculated component position slightly laterally measures 4.3 x 1.6 cm (series 34, image 3). Scattered foci of internal susceptibility artifact consistent with gas. No extension to involve the underlying spinal canal or vertebral column. Associated scattered ill-defined edema noted within the musculature of the visualized upper posterior chest. Prevertebral soft tissues are within normal limits. Normal flow voids seen within the vertebral arteries bilaterally. Disc levels: C2-C3: Mild uncovertebral hypertrophy without significant disc bulge. Right worse than left facet hypertrophy. No spinal stenosis. Mild to moderate right C3 foraminal narrowing. Left neural foramina remains patent. C3-C4: Left paracentral disc protrusion with slight inferior migration indents the left ventral thecal sac (series 33, image 10).  Mild flattening of the left ventral cord without cord signal changes. Mild spinal stenosis. Superimposed uncovertebral and facet hypertrophy with resultant mild to moderate right worse than left C4 foraminal stenosis. C4-C5: Disc desiccation. Broad-based central disc protrusion indents the ventral thecal sac, eccentric to the left (series 33, image 15). Superimposed uncovertebral and facet hypertrophy. Resultant mild to moderate spinal stenosis. Severe right worse than left C5 foraminal narrowing. C5-C6: Small right paracentral disc protrusion indents the ventral thecal sac (series 34, image 25). Mild spinal stenosis without cord deformity. Superimposed bilateral facet hypertrophy. Mild to moderate left worse than right C6 foraminal narrowing. C6-C7: Disc desiccation with diffuse disc bulge. Superimposed central disc extrusion with inferior migration. Extruded disc material contacts and mildly flattens the ventral cord. No cord signal changes. Moderate spinal stenosis. Superimposed facet and uncovertebral hypertrophy with resultant severe right worse than left C7 foraminal stenosis. C7-T1: Small amount of extruded disc material extends from the C6-7 level above. No other disc pathology at this level. Right worse than left facet hypertrophy. No significant spinal stenosis. Mild right C8 foraminal narrowing. Left neural foramina remains patent. MRI THORACIC SPINE FINDINGS Alignment:  Examination technically limited by motion artifact. Normal alignment with preservation of the normal thoracic kyphosis. No listhesis. Vertebrae: Vertebral body height maintained without acute or chronic fracture. Bone marrow signal intensity diffusely decreased on T1 weighted imaging.  1.5 cm benign hemangioma noted within the T7 vertebral body. No worrisome osseous lesions. Abnormal marrow edema and enhancement seen involving the T3, T4, T5, and T6 vertebral bodies, suspicious for acute osteomyelitis. Probable involvement of the anterior  aspects of the T10 and T11 vertebral bodies as well (series 37, image 4) no convincing evidence for intervening discitis at this time. Mild marrow edema seen about the right T3-4 and T4-5 facets, suspected to reflect concomitant septic arthritis (series 37, image 2). Cord: No convincing cord signal abnormality seen on this motion degraded exam. Thin enhancement seen involving the ventral epidural space at T3 through T6, likely related to mild infection/phlegmon (series 37, image 6). Possible scattered dorsal involvement noted as well. No frank epidural abscess. Paraspinal and other soft tissues: Known left chest wall collections/abscesses are partially visualized, with largest component seen at the upper left posterior chest measuring 11.8 x 1.9 cm (series 38, image 4). Mild paraspinous phlegmon/enhancement seen adjacent to the infected vertebral bodies, most pronounced at T3-4 and T4-5 (series 39, image 10, 15) no frank paraspinous abscess or collection. Mildly irregular layering bilateral pleural effusions, left greater than right partially visualized. Atelectasis and/or infiltrates noted within the partially visualized lungs. Disc levels: T1-2: Negative interspace. Bilateral facet hypertrophy. No stenosis. T2-3: Facet hypertrophy. Probable right foraminal disc protrusion. No significant spinal stenosis. Mild bilateral foraminal narrowing. T3-4: Negative interspace. Bilateral facet hypertrophy with possible septic arthritis on the right. No significant stenosis. T4-5: Central/right paracentral disc protrusion indents the ventral thecal sac. Flattening of the right ventral cord. Posterior element hypertrophy. Mild spinal stenosis. Foramina remain patent. T5-6: Right paracentral disc protrusion indents the right ventral thecal sac, contacting and flattening the right cord. Moderate spinal stenosis. Foramina remain patent. T6-7: Central/left paracentral disc extrusion with superior migration contacts and flattens the  ventral cord. Posterior element hypertrophy with prominence of the epidural fat. Mild to moderate spinal stenosis. Foramina remain patent. T7-8: Shallow left paracentral disc protrusion. Prominence of the dorsal epidural fat with mild posterior element hypertrophy. Mild spinal stenosis. Foramina remain patent. T8-9: Small central irregular disc protrusion. Mild posterior element hypertrophy. Mild spinal stenosis. Foramina remain patent. T9-10: Left paracentral to foraminal disc protrusion. Posterior element hypertrophy. Mild spinal stenosis. Mild left neural foraminal narrowing. T10-11: Right paracentral disc protrusion with superior migration. Moderate facet hypertrophy. Mild flattening of the ventral cord with mild-to-moderate spinal stenosis. Moderate bilateral foraminal narrowing. T11-12: Right paracentral disc extrusion with superior migration. Secondary flattening of the right ventral cord. Moderate facet hypertrophy. Resultant moderate canal with bilateral foraminal stenosis. T12-L1: Right paracentral disc protrusion. Moderate facet hypertrophy. No significant stenosis. MRI LUMBAR SPINE FINDINGS Segmentation:  Standard. Alignment: 7 mm anterolisthesis of L5 on S1. Trace retrolisthesis of L1 on L2 and L2 on L3. Vertebrae: Sequelae of prior PLIF at L3 through S1. Vertebral body height maintained without acute or chronic fracture. Diffusely decreased T1 signal intensity within the underlying bone marrow. There is abnormal edema involving the L2-3 disc, with marrow edema involving the adjacent L2 and L3 vertebral bodies, consistent with osteomyelitis discitis. Suspected additional involvement of the adjacent L1-2 disc as well. Conus medullaris and cauda equina: Conus extends to the L1 level. Conus and cauda equina appear normal. No visible significant epidural involvement or epidural abscess. Paraspinal and other soft tissues: Abnormal edema with multifocal abscesses seen involving the psoas muscles bilaterally.  Largest of these seen on the right and measures 3.7 x 2.8 cm (series 28, image 30). Additional collection at the laminectomy site within the  posterior soft tissues measures 2.9 x 2.1 x 5.9 cm, felt to be most consistent with a benign postoperative seroma. Additional chronic postoperative changes noted elsewhere within the posterior paraspinous soft tissues. No visible SI joint involvement. Disc levels: L1-2: Retrolisthesis. Intervertebral disc space narrowing with mild disc bulge. Moderate facet hypertrophy. No significant spinal stenosis. Mild bilateral L1 foraminal narrowing. L2-3: Trace retrolisthesis. Mild disc bulge. Advanced facet and ligament flavum hypertrophy. Resultant moderate spinal stenosis. Moderate bilateral L2 foraminal narrowing. L3-4:  Prior PLIF.  No residual stenosis. L4-5:  Prior PLIF.  No residual stenosis. L5-S1: Chronic 7 mm anterolisthesis with prior PLIF. No significant residual spinal stenosis. Probable residual mild to moderate foraminal narrowing largely due to slippage. IMPRESSION: MRI CERVICAL SPINE IMPRESSION: 1. No evidence for osteomyelitis discitis within the cervical spine. No epidural abscess or other abnormality. 2. Partially visualized collections/abscesses involving the left upper chest wall, better seen on recent CT examinations. No extension to involve the underlying spinal canal or vertebral column. 3. Multilevel degenerative spondylosis with resultant mild to moderate spinal stenosis at C3-4 through C6-7 as above. Severe right worse than left C5 and C7 foraminal stenosis. MRI THORACIC SPINE IMPRESSION: 1. Findings consistent with acute osteomyelitis involving the T3, T4, T5, and T6 vertebral bodies, with probable involvement of the anterior aspect of the T10 and T11 vertebral bodies. No convincing evidence for intervening discitis at this time. 2. Mild marrow edema and enhancement involving the right T3-4 and T4-5 facets, consistent with concomitant septic arthritis. 3.  Enhancement involving the ventral epidural space at T3 through T6, consistent with mild/early epidural involvement. No frank epidural abscess. 4. Partially visualized left chest wall abscesses/collections with bilateral pleural effusions. 5. Multilevel degenerative spondylosis with resultant mild to moderate diffuse spinal stenosis throughout the thoracic spine as detailed above. MRI LUMBAR SPINE IMPRESSION: 1. 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 No appreciable epidural involvement at this time. 2. Multifocal abscesses involving the bilateral psoas musculature, largest of which on the right measures up to 3.7 cm. 3. Postoperative changes from prior PLIF at L3-4 through L5-S1 without residual spinal stenosis. Collection at the posterior laminectomy site felt to be most consistent with a benign postoperative seroma. 4. 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 Electronically Signed   By: Rise Mu M.D.   On: 02/15/2021 03:11   CT SHOULDER LEFT WO CONTRAST  Result Date: 02/14/2021 CLINICAL DATA:  Septic arthritis suspected, shoulder, xray done septic arthritis left shoulder EXAM: CT OF THE UPPER LEFT EXTREMITY WITHOUT CONTRAST TECHNIQUE: Multidetector CT imaging of the upper left extremity was performed according to the standard protocol. COMPARISON:  CT 02/09/2021 FINDINGS: Bones/Joint/Cartilage There is no evidence of acute fracture. There is no new osseous erosion, destruction, or periosteal reaction. Persisting gas containing glenohumeral joint effusion and distention of the subacromial-subdeltoid bursa. Some of this gas/fluid is collecting in the subcoracoid recess. Ligaments Suboptimally assessed by CT. Muscles and Tendons There is persisting gas and fluid seen along the left upper chest wall/axilla, with most superior extent extending up the neck to the level of C6-C7 (axial image 3). There is  persistent foci of intercostal gas (axial image 102/105). There is paraspinal muscle gas is well (axial image 82 and 51). Discrete abscesses are difficult to measure, with most well-defined area measuring 4.0 x 1.9 cm in the transverse plane by 7.5 cm in craniocaudal extent. Soft tissues As above. Left axillary lymphadenopathy. Small left pleural effusion. There  is surgical material, likely packing along the left posterolateral soft tissues on axial image 112). IMPRESSION: Persistent, extensive abscess along the left chest wall/axilla, with visible gas/fluid extending superiorly in the neck to the level of C6/C7, and posteriorly to the paraspinal musculature. Most well-defined collection is within the axilla measuring 4.0 x 1.9 cm in the transverse plane and 7.5 cm in craniocaudal extent. Gas containing left shoulder joint effusion and distension of the subacromial-subdeltoid bursa, concerning for septic arthritis/bursitis, though these findings could in part be related to recent surgery. No new osseous abnormality to suggest osteomyelitis, though note CT is insensitive in detecting osteomyelitis. Electronically Signed   By: Caprice Renshaw   On: 02/14/2021 07:34   CT SHOULDER LEFT WO CONTRAST  Result Date: 02/09/2021 CLINICAL DATA:  Patient status post left shoulder, arthrotomy, irrigation and debridement of the joint. Biceps tenotomy and excision of necrotic rotator cuff was also performed 02/08/2021. The patient has infection may extend into the right chest. EXAM: CT OF THE UPPER LEFT EXTREMITY WITHOUT CONTRAST TECHNIQUE: Multidetector CT imaging of the upper left extremity was performed according to the standard protocol. COMPARISON:  MRI right shoulder 02/07/2021. FINDINGS: Bones/Joint/Cartilage CT is not sensitive for the detection of osteomyelitis. No bony destructive change or periosteal reaction is identified. The patient has moderate acromioclavicular osteoarthritis. There appears to be a small  glenohumeral joint effusion. No fracture or dislocation. Ligaments Suboptimally assessed by CT. Muscles and Tendons Gas and fluid are seen in multiple muscles about the shoulder. Discrete measurement collections is difficult but a gas and fluid within and superior to the supraspinatus muscle measures approximately 6.8 cm transverse by 3.5 cm AP by 1.7 cm craniocaudal. Gas and fluid are also seen surrounding the scapula and extending into the intercostal spaces between the second and third ribs as seen on image 24 series 7 and the fourth and fifth ribs as seen on image 59 of series 7. Soft tissues As above. IMPRESSION: Extensive abscess in the left chest wall and intercostal spaces most consistent with multifocal pyomyositis as described above. Flocculent gas in musculature about the left shoulder is worrisome for residual abscess although recent surgery complicates evaluation. CT is not sensitive for the detection of osteomyelitis but no evidence of osteomyelitis is seen. These results were called by telephone at the time of interpretation on 02/09/2021 at 9:17 am to provider [facility] , who verbally acknowledged these results. Electronically Signed   By: Drusilla Kanner M.D.   On: 02/09/2021 09:20   CT SHOULDER RIGHT WO CONTRAST  Result Date: 02/14/2021 CLINICAL DATA:  Septic arthritis suspected, shoulder, xray done septic arthritis right shoulder EXAM: CT OF THE UPPER RIGHT EXTREMITY WITHOUT CONTRAST TECHNIQUE: Multidetector CT imaging of the upper right extremity was performed according to the standard protocol. COMPARISON:  Right shoulder MRI 02/07/2021, CT 02/01/2021 FINDINGS: Bones/Joint/Cartilage Postsurgical changes of the right shoulder, including AC joint resection. There is soft tissue gas in fluid anteriorly, likely surgical related. There a small glenohumeral joint effusion and small amount of fluid in the subacromial-subdeltoid bursa. Ligaments Suboptimally assessed by CT. Muscles and Tendons  Findings of full-thickness cuff tear. Soft tissues There is soft tissue swelling along the shoulder. In subcutaneous edema extending down the arm. IMPRESSION: Postsurgical changes at the right shoulder including resection at the [facility] joint. Soft tissue gas and fluid anteriorly, likely related to recent surgery. Small glenohumeral joint effusion with mild distention of the subacromial-subdeltoid bursa. There is soft tissue edema without no well-defined soft tissue abscess along  the right shoulder. No new osseous changes to suggest osteomyelitis, though note CT is insensitive in detecting osteomyelitis. Electronically Signed   By: Caprice Renshaw   On: 02/14/2021 07:44   MR Shoulder Right W Wo Contrast  Result Date: 02/07/2021 CLINICAL DATA:  Right shoulder pain and stiffness. History of MSSA bacteremia, septic pulmonary emboli, and endocarditis. EXAM: MRI OF THE RIGHT SHOULDER WITHOUT AND WITH CONTRAST TECHNIQUE: Multiplanar, multisequence MR imaging of the right shoulder was performed before and after the administration of intravenous contrast. CONTRAST:  10mL GADAVIST GADOBUTROL 1 MMOL/ML IV SOLN COMPARISON:  Right shoulder x-rays dated February 01, 2021. FINDINGS: Rotator cuff: Full-thickness, essentially full width tear of the supraspinatus tendon with 2.1 cm retraction to the top of the humeral head. High-grade partial-thickness articular surface tear of the mid to distal infraspinatus tendon. The teres minor and subscapularis tendons are intact. Muscles:  Mild infraspinatus muscle edema.  No muscle atrophy. Biceps long head: Partially torn within the bicipital groove. Normally positioned. Moderate tendinosis. Acromioclavicular Joint: Moderate arthropathy of the acromioclavicular joint. Type II acromion. Moderate amount of fluid in the subacromial/subdeltoid bursa extending into the subcoracoid bursa. Small amount of fluid superior to the acromioclavicular joint communicating with the subacromial/subdeltoid bursa  through the joint space. Glenohumeral Joint: Trace joint effusion. Diffuse cartilage thinning without focal defect. Labrum: Grossly intact, but evaluation is limited by lack of intraarticular fluid. Bones: No acute fracture or dislocation. No suspicious bone lesion. Other: None. IMPRESSION: 1. No convincing evidence of septic arthritis. Moderate amount of fluid in the subacromial/subdeltoid bursa and subcoracoid bursa is favored related to underlying rotator cuff pathology described below rather than septic bursitis, although fluid sampling could be obtained for definitive diagnosis. 2. Full-thickness, essentially full width tear of the supraspinatus tendon with 2.1 cm retraction to the top of the humeral head. No muscle atrophy. 3. High-grade partial-thickness articular surface tear of the mid to distal infraspinatus tendon. 4. Partially torn long head biceps tendon within the bicipital groove. 5. Moderate acromioclavicular and mild glenohumeral osteoarthritis. Electronically Signed   By: Obie Dredge M.D.   On: 02/07/2021 08:55   MR Shoulder Left W Wo Contrast  Result Date: 02/07/2021 CLINICAL DATA:  Left shoulder pain and stiffness. History of MSSA bacteremia, septic pulmonary emboli, and endocarditis. EXAM: MRI OF THE LEFT SHOULDER WITHOUT AND WITH CONTRAST TECHNIQUE: Multiplanar, multisequence MR imaging of the left shoulder was performed before and after the administration of intravenous contrast. CONTRAST:  10mL GADAVIST GADOBUTROL 1 MMOL/ML IV SOLN, 10mL GADAVIST GADOBUTROL 1 MMOL/ML IV SOLN COMPARISON:  Left shoulder x-rays dated February 01, 2021. FINDINGS: Rotator cuff: Full-thickness, partial width tear involving the anterior supraspinatus tendon with 2.2 cm retraction to the top of the humeral head. Additional high-grade partial-thickness articular surface tear of the posterior supraspinatus tendon. High-grade partial-thickness articular surface tear of the superior subscapularis tendon. The  infraspinatus and teres minor tendons are intact. Muscles:  Mild reactive rotator cuff muscle edema.  No atrophy. Biceps long head:  Completely torn within the bicipital groove. Acromioclavicular Joint: Moderate arthropathy of the acromioclavicular joint. Small joint effusion with thick synovial enhancement (series 12, image 5). Type II acromion. Large amount of fluid within the subacromial/subdeltoid bursa and subcoracoid bursa with synovial enhancement. Fluid extends proximally along the supraspinatus, infraspinatus, and subscapularis muscles. Glenohumeral Joint: Small joint effusion with thick synovial enhancement. Diffuse cartilage thinning without focal defect. Labrum:  Intact. Bones: Mild marrow edema and enhancement in the greater tuberosity with relatively preserved T1 marrow signal, likely  reactive to rotator cuff pathology. No acute fracture or dislocation. No suspicious bone lesion. Other: Large rim enhancing fluid collection in the left axilla, incompletely visualized (series 12, image 12). IMPRESSION: 1. Findings concerning for glenohumeral and acromioclavicular septic arthritis with septic subacromial/subdeltoid and subcoracoid bursitis. No osteomyelitis. 2. Large abscess in the left axilla, incompletely visualized. 3. Full-thickness, partial width tear of the anterior supraspinatus tendon with 2.2 cm retraction to the top of the humeral head. Additional high-grade partial-thickness articular surface tear of the posterior supraspinatus tendon. 4. High-grade partial-thickness articular surface tear of the superior subscapularis tendon. 5. Complete tear of the biceps long head tendon within the bicipital groove. Electronically Signed   By: Obie Dredge M.D.   On: 02/07/2021 08:42   CT CHEST ABDOMEN PELVIS W CONTRAST  Result Date: 02/01/2021 CLINICAL DATA:  Fall, left chest and abdominal pain, midthoracic spine tenderness to palpation. Gram-positive bacteremia. EXAM: CT CHEST, ABDOMEN, AND PELVIS  WITH CONTRAST TECHNIQUE: Multidetector CT imaging of the chest, abdomen and pelvis was performed following the standard protocol during bolus administration of intravenous contrast. CONTRAST:  OMNIPAQUE IOHEXOL 300 MG/ML  SOLN COMPARISON:  None. FINDINGS: CT CHEST FINDINGS Cardiovascular: No significant coronary artery calcification. Global cardiac size within normal limits. No pericardial effusion. Central pulmonary arteries are of normal caliber. Thoracic aorta is unremarkable. Mediastinum/Nodes: The thyroid gland is unremarkable. No pathologic thoracic adenopathy. The esophagus is unremarkable. Lungs/Pleura: A 7 mm peribronchial cavitary nodule is seen within the inferior lingula at axial image # 93/7 and additional 10 mm similar cavitary nodule is seen within the basilar lingula at axial image # 101/7. A similar tiny cavitary nodule is seen within the right mid lung zone at axial image # 77/7 in a centrilobular location. A 7 mm noncalcified pulmonary nodule may demonstrate central umbilication suggesting early cavitation, best seen on coronal image # 36/5 at axial image # 72/7. Among the differential considerations, these most likely represent septic emboli given their random distribution and additional findings listed below. No pneumothorax or pleural effusion. The central airways are widely patent. Musculoskeletal: No acute bone abnormality. No lytic or blastic bone lesions. No focal osseous erosions. The osseous structures are age-appropriate. There is a multiloculated intramuscular fluid collection identified within the a subscapular posterior left chest wall measuring at least 5.5 x 7.3 x 10.4 cm in size on axial image # 11/2 and sagittal reformat # 164/6 which extends into the suprascapular region superiorly. The inflammatory changes adjacent to this collection extend inferiorly to involve the left latissimus dorsi musculature where multiple punctate foci of gas are identified. Together, the findings  are suspicious for a a intramuscular abscess and/or an infected hematoma. There is soft tissue infiltration within the left chest wall laterally which appears separate from this collection which may represent edema related to trauma in this acutely traumatized patient or a separate focus of inflammatory change. CT ABDOMEN PELVIS FINDINGS Hepatobiliary: No focal liver abnormality is seen. No gallstones, gallbladder wall thickening, or biliary dilatation. Pancreas: Unremarkable Spleen: Unremarkable Adrenals/Urinary Tract: Adrenal glands are unremarkable. Kidneys are normal, without renal calculi, focal lesion, or hydronephrosis. Bladder is unremarkable. Stomach/Bowel: Stomach is within normal limits. Appendix appears normal. No evidence of bowel wall thickening, distention, or inflammatory changes. No free intraperitoneal gas or fluid. Vascular/Lymphatic: Minimal atherosclerotic calcification within the abdominal aorta. No aortic aneurysm. No pathologic adenopathy within the abdomen and pelvis. Reproductive: Prostate is unremarkable. Other: Tiny fat containing umbilical hernia. There is diffuse subcutaneous edema involving the body wall and  flanks bilaterally in keeping with mild anasarca. Musculoskeletal: No acute bone abnormality within the abdomen and pelvis. No lytic or blastic bone lesion. L3-S1 lumbar fusion with instrumentation and resection of the spinous processes of L4 and L5 has been performed. No loculated fluid collection is clearly identified within this region. IMPRESSION: Multiloculated fluid collection within the subscapular left posterior chest wall extending superiorly into the suprascapular region and medially just lateral to the spinous processes of T1-3. This appears contiguous with inflammatory changes involving the latissimus dorsi muscle where extensive foci of gas are identified and is suspicious for a a intramuscular abscess and/or infected hematoma. Fluid components superiorly within the  suprascapular region or paraspinal region should be amenable to ultrasound-guided sampling for microbiology evaluation. Multiple small cavitary nodules within the mid and lower lung zones randomly distributed. Among the differential considerations, which include cystic metastases and Langerhans cell histiocytosis, the associated findings listed above favor septic embolization as an etiology. These can be reassessed following completion of antibiotic therapy. Separate focus of inflammatory change within the left axilla possibly representing a focus of edema or hemorrhage related to trauma in this acutely traumatized patient. Alternatively, this may simply represent extension of the inflammatory process listed above. Electronically Signed   By: Helyn Numbers MD   On: 02/01/2021 20:00   IR US Guide Bx Asp/Drain  Result Date: 02/02/2021 INDICATION: Bacteremia and multiloculated fluid collection involving the left upper posterior chest wall. EXAM: ULTRASOUND-GUIDED ASPIRATION DRAINAGE OF LEFT POSTERIOR CHEST WALL ABSCESS MEDICATIONS: The patient is currently admitted to the hospital and receiving intravenous antibiotics. The antibiotics were administered within an appropriate time frame prior to the initiation of the procedure. ANESTHESIA/SEDATION: Moderate (conscious) sedation was employed during this procedure. A total of Versed 3.5 mg and Fentanyl 1 mcg was administered intravenously. Moderate Sedation Time: 14 minutes. The patient's level of consciousness and vital signs were monitored continuously by radiology nursing throughout the procedure under my direct supervision. COMPLICATIONS: None immediate. PROCEDURE: Informed written consent was obtained from the patient after a thorough discussion of the procedural risks, benefits and alternatives. All questions were addressed. Maximal Sterile Barrier Technique was utilized including caps, mask, sterile gowns, sterile gloves, sterile drape, hand hygiene and skin  antiseptic. A timeout was performed prior to the initiation of the procedure. Ultrasound was utilized to localize a fluid collection in the soft tissues of the left upper posterior chest wall. Under ultrasound guidance, a 5 [place] Yueh centesis needle catheter was advanced into the fluid collection. Aspiration was performed. A fluid sample was sent for culture analysis. Additional ultrasound was performed. FINDINGS: Fluid collection was able to be localized superficial to the posterior ribs and centered predominantly medial to the scapula on the left. Aspiration yielded purulent fluid. A total volume of approximately 60 mL of fluid was able to be aspirated from the collection resulting in significant decompression by ultrasound. IMPRESSION: Ultrasound-guided aspiration of left upper posterior chest wall abscess medial to the scapula. 60 mL of fluid was able to be aspirated from a centesis catheter. A fluid sample was sent for culture analysis. Electronically Signed   By: Irish Lack M.D.   On: 02/02/2021 17:06   DG CHEST PORT 1 VIEW  Result Date: 02/13/2021 CLINICAL DATA:  Respiratory compromise EXAM: PORTABLE CHEST 1 VIEW COMPARISON:  02/10/2021 FINDINGS: Low lung volumes. Hazy atelectasis at the bases. Small left pleural effusion. Stable cardiomediastinal silhouette. No pneumothorax. IMPRESSION: Low lung volumes with small left effusion and hazy atelectasis at the bases Electronically Signed  By: Jasmine Pang M.D.   On: 02/13/2021 23:28   DG Chest Port 1 View  Result Date: 02/10/2021 CLINICAL DATA:  Status post right shoulder I and D. EXAM: PORTABLE CHEST 1 VIEW COMPARISON:  January 31, 2021 FINDINGS: Cardiomediastinal silhouette is normal. Mediastinal contours appear intact. There is no evidence of focal airspace consolidation, pleural effusion or pneumothorax. Interval resection of the distal right clavicle. Overlying soft tissue swelling and emphysema. IMPRESSION: Interval resection of the distal  right clavicle. Overlying soft tissue swelling and emphysema. Electronically Signed   By: Ted Mcalpine M.D.   On: 02/10/2021 13:47   DG Chest Port 1 View  Result Date: 01/31/2021 CLINICAL DATA:  Weakness. EXAM: PORTABLE CHEST 1 VIEW COMPARISON:  None. FINDINGS: Cardiomediastinal silhouette is normal. Mediastinal contours appear intact. There is no evidence of focal airspace consolidation, pleural effusion or pneumothorax. Osseous structures are without acute abnormality. Soft tissues are grossly normal. IMPRESSION: No active disease. Electronically Signed   By: Ted Mcalpine M.D.   On: 01/31/2021 09:34   DG Shoulder Left  Result Date: 02/01/2021 CLINICAL DATA:  Bilateral shoulder pain without known injury. EXAM: LEFT SHOULDER - 2+ VIEW COMPARISON:  None. FINDINGS: There is no evidence of fracture or dislocation. There is no evidence of arthropathy or other focal bone abnormality. Soft tissues are unremarkable. IMPRESSION: Negative. Electronically Signed   By: Lupita Raider M.D.   On: 02/01/2021 16:43   DG Shoulder Left  Result Date: 01/20/2021 CLINICAL DATA:  55 year old male with left shoulder pain after fall 2 weeks ago. EXAM: LEFT SHOULDER - 2+ VIEW COMPARISON:  None. FINDINGS: There is no evidence of fracture or dislocation. Mild acromioclavicular joint degenerative changes. Soft tissues are unremarkable. IMPRESSION: No acute fracture or malalignment. Electronically Signed   By: Marliss Coots MD   On: 01/20/2021 09:11   ECHOCARDIOGRAM COMPLETE  Result Date: 02/03/2021    ECHOCARDIOGRAM REPORT   Patient Name:   THELONIOUS KAUFFMANN Date of Exam: 02/03/2021 Medical Rec #:  [id number]        Height:       70.0 in Accession #:    [id number]       Weight:       198.2 lb Date of Birth:  [birth year]        BSA:          2.079 m Patient Age:    54 years         BP:           129/83 mmHg Patient Gender: M                HR:           116 bpm. Exam Location:  Inpatient Procedure: 2D Echo, Cardiac  Doppler, Color Doppler and Intracardiac            Opacification Agent Indications:    Bacteremia R78.81  History:        Patient has no prior history of Echocardiogram examinations.                 Risk Factors:Hypertension and Diabetes. COVID-19.  Sonographer:    Elmarie Shiley Dance Referring Phys: [id number] DAVID GIRGUIS IMPRESSIONS  1. Left ventricular ejection fraction, by estimation, is 55 to 60%. The left ventricle has normal function. The left ventricle has no regional wall motion abnormalities. There is mild left ventricular hypertrophy. Left ventricular diastolic parameters were normal.  2. Right ventricular systolic function is normal. The right ventricular  size is normal.  3. The mitral valve is normal in structure. No evidence of mitral valve regurgitation. No evidence of mitral stenosis.  4. The aortic valve is tricuspid. There is mild calcification of the aortic valve. Aortic valve regurgitation is not visualized. Mild aortic valve sclerosis is present, with no evidence of aortic valve stenosis.  5. Aortic dilatation noted. There is mild dilatation of the ascending aorta, measuring 38 mm.  6. The inferior vena cava is normal in size with greater than 50% respiratory variability, suggesting right atrial pressure of 3 mmHg. FINDINGS  Left Ventricle: Left ventricular ejection fraction, by estimation, is 55 to 60%. The left ventricle has normal function. The left ventricle has no regional wall motion abnormalities. Definity contrast agent was given IV to delineate the left ventricular  endocardial borders. The left ventricular internal cavity size was normal in size. There is mild left ventricular hypertrophy. Left ventricular diastolic parameters were normal. Right Ventricle: The right ventricular size is normal. No increase in right ventricular wall thickness. Right ventricular systolic function is normal. Left Atrium: Left atrial size was normal in size. Right Atrium: Right atrial size was normal in size.  Pericardium: There is no evidence of pericardial effusion. Mitral Valve: The mitral valve is normal in structure. No evidence of mitral valve regurgitation. No evidence of mitral valve stenosis. Tricuspid Valve: The tricuspid valve is normal in structure. Tricuspid valve regurgitation is mild . No evidence of tricuspid stenosis. Aortic Valve: The aortic valve is tricuspid. There is mild calcification of the aortic valve. Aortic valve regurgitation is not visualized. Mild aortic valve sclerosis is present, with no evidence of aortic valve stenosis. Pulmonic Valve: The pulmonic valve was normal in structure. Pulmonic valve regurgitation is not visualized. No evidence of pulmonic stenosis. Aorta: The aortic root is normal in size and structure and aortic dilatation noted. There is mild dilatation of the ascending aorta, measuring 38 mm. Venous: The inferior vena cava is normal in size with greater than 50% respiratory variability, suggesting right atrial pressure of 3 mmHg. IAS/Shunts: No atrial level shunt detected by color flow Doppler.  LEFT VENTRICLE PLAX 2D LVIDd:         4.60 cm  Diastology LVIDs:         2.60 cm  LV e' medial:    7.94 cm/s LV PW:         1.20 cm  LV E/e' medial:  5.9 LV IVS:        1.20 cm  LV e' lateral:   10.20 cm/s LVOT diam:     2.40 cm  LV E/e' lateral: 4.6 LV SV:         51 LV SV Index:   24 LVOT Area:     4.52 cm  RIGHT VENTRICLE             IVC RV S prime:     16.20 cm/s  IVC diam: 2.00 cm TAPSE (M-mode): 1.9 cm LEFT ATRIUM             Index LA diam:        3.50 cm 1.68 cm/m LA Vol (A2C):   30.3 ml 14.57 ml/m LA Vol (A4C):   45.6 ml 21.93 ml/m LA Biplane Vol: 39.1 ml 18.80 ml/m  AORTIC VALVE LVOT Vmax:   86.30 cm/s LVOT Vmean:  56.900 cm/s LVOT VTI:    0.112 m  AORTA Ao Root diam: 4.00 cm Ao Asc diam:  3.80 cm MITRAL VALVE MV Area (PHT):  4.60 cm    SHUNTS MV Decel Time: 165 msec    Systemic VTI:  0.11 m MV E velocity: 47.10 cm/s  Systemic Diam: 2.40 cm MV A velocity: 48.40 cm/s MV  E/A ratio:  0.97 Charlton HawsPeter Nishan MD Electronically signed by Charlton HawsPeter Nishan MD Signature Date/Time: 02/03/2021/11:37:09 AM    Final    ECHO TEE  Result Date: 02/06/2021    TRANSESOPHOGEAL ECHO REPORT   Patient Name:   [name] Date of Exam: 02/05/2021 Medical Rec #:  [id number]        Height:       70.0 in Accession #:    [id number]       Weight:       214.5 lb Date of Birth:  03/16/1966        BSA:          2.150 m Patient Age:    54 years         BP:           84/64 mmHg Patient Gender: M                HR:           96 bpm. Exam Location:  Inpatient Procedure: Transesophageal Echo, Cardiac Doppler and Color Doppler Indications:     Bacteremia  History:         Patient has prior history of Echocardiogram examinations, most                  recent 02/03/2021.  Sonographer:     Roosvelt Maserachel Lane RDCS Referring Phys:  [id number] Manson PasseyBHAVINKUMAR BHAGAT Diagnosing Phys: Armanda Magicraci Turner MD PROCEDURE: After discussion of the risks and benefits of a TEE, an informed consent was obtained from the patient. The transesophogeal probe was passed without difficulty through the esophogus of the patient. Local oropharyngeal anesthetic was provided with Cetacaine. Sedation performed by different physician. The patient was monitored while under deep sedation. Anesthestetic sedation was provided intravenously by Anesthesiology: 300mg  of Propofol, 40mg  of Lidocaine. Image quality was good. The patient's vital signs; including heart rate, blood pressure, and oxygen saturation; remained stable throughout the procedure. The patient developed no complications during the procedure. IMPRESSIONS  1. Left ventricular ejection fraction, by estimation, is 60 to 65%. The left ventricle has normal function. The left ventricle has no regional wall motion abnormalities.  2. Right ventricular systolic function is normal. The right ventricular size is normal.  3. No left atrial/left atrial appendage thrombus was detected.  4. The mitral valve is normal in  structure. Trivial mitral valve regurgitation. No evidence of mitral stenosis.  5. The tricuspid valve is abnormal. There is a large filamentous highly mobile density predominantly on the ventricular side of the TV leaflets consistent with vegetation with associated mild TR.  6. The aortic valve is normal in structure. Aortic valve regurgitation is trivial. No aortic stenosis is present.  7. The inferior vena cava is normal in size with greater than 50% respiratory variability, suggesting right atrial pressure of 3 mmHg. Conclusion(s)/Recommendation(s): Normal biventricular function with evidence of TV vegetation. FINDINGS  Left Ventricle: Left ventricular ejection fraction, by estimation, is 60 to 65%. The left ventricle has normal function. The left ventricle has no regional wall motion abnormalities. The left ventricular internal cavity size was normal in size. There is  no left ventricular hypertrophy. Right Ventricle: The right ventricular size is normal. No increase in right ventricular wall thickness. Right ventricular systolic function is normal. Left  Atrium: Left atrial size was normal in size. No left atrial/left atrial appendage thrombus was detected. Right Atrium: Right atrial size was normal in size. Pericardium: There is no evidence of pericardial effusion. Mitral Valve: The mitral valve is normal in structure. Trivial mitral valve regurgitation. No evidence of mitral valve stenosis. Tricuspid Valve: There is a large filamentous highly mobile density predominantly on the ventricular side of the TV leaflets consistent with vegetation. There is possible TV prolapse present as well. The tricuspid valve is abnormal. Tricuspid valve regurgitation is mild . No evidence of tricuspid stenosis. Aortic Valve: The aortic valve is normal in structure. Aortic valve regurgitation is trivial. No aortic stenosis is present. Pulmonic Valve: The pulmonic valve was normal in structure. Pulmonic valve regurgitation is not  visualized. No evidence of pulmonic stenosis. Aorta: The aortic root is normal in size and structure. Venous: The inferior vena cava is normal in size with greater than 50% respiratory variability, suggesting right atrial pressure of 3 mmHg. IAS/Shunts: No atrial level shunt detected by color flow Doppler. Armanda Magic MD Electronically signed by Armanda Magic MD Signature Date/Time: 02/06/2021/8:52:07 AM    Final     Labs:  CBC: Recent Labs    02/12/21 0535 02/13/21 0551 02/14/21 0212 02/15/21 0558  WBC 15.5* 12.1* 10.7* 10.5  HGB 8.8* 7.4* 7.6* 7.7*  HCT 26.9* 23.2* 23.2* 24.1*  PLT 544* 486* 598* 537*    COAGS: Recent Labs    01/31/21 1059 02/01/21 0022  INR 1.4* 1.3*  APTT 24  --     BMP: Recent Labs    02/25/20 0956 06/23/20 1630 10/09/20 1155 01/31/21 0854 02/12/21 0535 02/13/21 0551 02/14/21 0212 02/15/21 0558  NA 139 138 137   < > 132* 129* 129* 131*  K 5.0 4.6 4.9   < > 4.4 4.6 4.5 4.1  CL 99 97 96   < > 95* 94* 94* 93*  CO2 23 20 22    < > 30 28 28 30   GLUCOSE 216* 413* 308*   < > 136* 181* 157* 126*  BUN 14 12 11    < > 7 7 7 9   CALCIUM 9.7 9.6 10.2   < > 8.2* 8.3* 8.2* 8.3*  CREATININE 0.88 1.08 0.89   < > 0.52* 0.38* 0.51* 0.59*  GFRNONAA 99 78 98   < > >60 >60 >60 >60  GFRAA 114 91 113  --   --   --   --   --    < > = values in this interval not displayed.    LIVER FUNCTION TESTS: Recent Labs    02/11/21 0539 02/12/21 0535 02/13/21 0551 02/14/21 0212  BILITOT 0.7 0.9 0.7 0.7  AST 25 29 49* 53*  ALT 15 17 26 30   ALKPHOS 55 61 60 53  PROT 4.9* 5.5* 5.2* 5.2*  ALBUMIN 1.9* 2.0* 1.8* 1.8*     Assessment and Plan:  Sepsis/MSSA bacteremia secondary to left chest fluid collection/bilateral shoulder septic arthritis despite multiple OR debridements (left shoulder/joint debridement along with biceps tenotomy and excision of necrotic rotator cuff in OR 02/08/2021 by Dr. 02/14/21; left lateral chest wall/axilla abscess I&D/debridement in OR by Dr.  02/15/21 02/10/2021; right shoulder debridement in OR 02/10/2021 by Dr. 02/10/2021 due to septic right shoulder); further complicated by development of left neck/supraclavicular fluid collection and bilateral psoas muscle fluid collections concerning for abscesses.  Plan for image-guided left neck/supraclavicular fossa fluid collection aspiration with possible drain placement along with image-guided bilateral psoas muscle abscess  aspiration with possible drain placements in IR tentatively for today pending IR scheduling. Patient has been NPO since 0800 today. Afebrile. Lovenox held this AM. INR 1.3 02/01/2021.  Risks and benefits discussed with the patient including bleeding, infection, damage to adjacent structures, bowel perforation/fistula connection, and sepsis. All of the patient's questions were answered, patient is agreeable to proceed. Consent signed and in chart.   Thank you for this interesting consult.  I greatly enjoyed meeting John Ware and look forward to participating in their care.  A copy of this report was sent to the requesting provider on this date.  Electronically Signed: Elwin Mocha, PA-C 02/15/2021, 10:07 AM   I spent a total of 40 Minutes in face to face in clinical consultation, greater than 50% of which was counseling/coordinating care for left neck/supraclavicular fossa fluid collection/aspiration and drainage.

## 2021-02-16 LAB — CBC WITH DIFFERENTIAL/PLATELET
Abs Immature Granulocytes: 0.06 10*3/uL (ref 0.00–0.07)
Basophils Absolute: 0 10*3/uL (ref 0.0–0.1)
Basophils Relative: 0 %
Eosinophils Absolute: 0.1 10*3/uL (ref 0.0–0.5)
Eosinophils Relative: 1 %
HCT: 22.1 % — ABNORMAL LOW (ref 39.0–52.0)
Hemoglobin: 7 g/dL — ABNORMAL LOW (ref 13.0–17.0)
Immature Granulocytes: 1 %
Lymphocytes Relative: 15 %
Lymphs Abs: 1.4 10*3/uL (ref 0.7–4.0)
MCH: 30.6 pg (ref 26.0–34.0)
MCHC: 31.7 g/dL (ref 30.0–36.0)
MCV: 96.5 fL (ref 80.0–100.0)
Monocytes Absolute: 0.7 10*3/uL (ref 0.1–1.0)
Monocytes Relative: 7 %
Neutro Abs: 7.5 10*3/uL (ref 1.7–7.7)
Neutrophils Relative %: 76 %
Platelets: 456 10*3/uL — ABNORMAL HIGH (ref 150–400)
RBC: 2.29 MIL/uL — ABNORMAL LOW (ref 4.22–5.81)
RDW: 15.1 % (ref 11.5–15.5)
WBC: 9.8 10*3/uL (ref 4.0–10.5)
nRBC: 0 % (ref 0.0–0.2)

## 2021-02-16 LAB — MAGNESIUM: Magnesium: 1.7 mg/dL (ref 1.7–2.4)

## 2021-02-16 LAB — BASIC METABOLIC PANEL
Anion gap: 8 (ref 5–15)
BUN: 7 mg/dL (ref 6–20)
CO2: 28 mmol/L (ref 22–32)
Calcium: 8.1 mg/dL — ABNORMAL LOW (ref 8.9–10.3)
Chloride: 95 mmol/L — ABNORMAL LOW (ref 98–111)
Creatinine, Ser: 0.63 mg/dL (ref 0.61–1.24)
GFR, Estimated: >60 mL/min (ref >60)
Glucose, Bld: 138 mg/dL — ABNORMAL HIGH (ref 70–99)
Potassium: 4.4 mmol/L (ref 3.5–5.1)
Sodium: 131 mmol/L — ABNORMAL LOW (ref 135–145)

## 2021-02-16 LAB — PREPARE RBC (CROSSMATCH)

## 2021-02-16 LAB — GLUCOSE, CAPILLARY
Glucose-Capillary: 141 mg/dL — ABNORMAL HIGH (ref 70–99)
Glucose-Capillary: 174 mg/dL — ABNORMAL HIGH (ref 70–99)
Glucose-Capillary: 131 mg/dL — ABNORMAL HIGH (ref 70–99)
Glucose-Capillary: 123 mg/dL — ABNORMAL HIGH (ref 70–99)

## 2021-02-16 MED ORDER — DIPHENHYDRAMINE HCL 50 MG/ML IJ SOLN: 12.5000 mg | Freq: Four times a day (QID) | INTRAMUSCULAR | Status: DC | PRN

## 2021-02-16 MED ORDER — ACETAMINOPHEN 500 MG PO TABS
500.0000 mg | ORAL_TABLET | Freq: Four times a day (QID) | ORAL | Status: DC
  Administered 2021-02-16 – 2021-03-15 (×95): 500 mg via ORAL
  Filled 2021-02-16 (×94): qty 1

## 2021-02-16 MED ORDER — LORAZEPAM 2 MG/ML IJ SOLN
1.0000 mg | Freq: Once | INTRAMUSCULAR | Status: AC | PRN
  Administered 2021-02-16: 1 mg via INTRAVENOUS
  Filled 2021-02-16: qty 1

## 2021-02-16 MED ORDER — HYDROMORPHONE 1 MG/ML IV SOLN
INTRAVENOUS | Status: DC
  Administered 2021-02-16: 30 mg via INTRAVENOUS
  Administered 2021-02-16: 1.4 mg via INTRAVENOUS
  Administered 2021-02-16: 2.5 mg via INTRAVENOUS
  Administered 2021-02-17: 3.8 mg via INTRAVENOUS
  Administered 2021-02-17: 1.8 mg via INTRAVENOUS
  Administered 2021-02-17 (×2): 1.6 mg via INTRAVENOUS
  Administered 2021-02-17: 3.8 mg via INTRAVENOUS
  Administered 2021-02-17: 1.2 mg via INTRAVENOUS
  Administered 2021-02-18: 30 mg via INTRAVENOUS
  Administered 2021-02-18: 2.4 mg via INTRAVENOUS
  Administered 2021-02-18: 3.2 mg via INTRAVENOUS
  Administered 2021-02-18: 2.8 mg via INTRAVENOUS
  Administered 2021-02-18: 3.8 mg via INTRAVENOUS
  Administered 2021-02-18: 1.2 mg via INTRAVENOUS
  Administered 2021-02-18: 2.6 mg via INTRAVENOUS
  Administered 2021-02-19: 3.4 mg via INTRAVENOUS
  Administered 2021-02-19: 4 mg via INTRAVENOUS
  Administered 2021-02-19: 3 mg via INTRAVENOUS
  Administered 2021-02-19: 2.4 mg via INTRAVENOUS
  Administered 2021-02-19: 30 mg via INTRAVENOUS
  Administered 2021-02-19: 3 mg via INTRAVENOUS
  Administered 2021-02-20: 1.2 mg via INTRAVENOUS
  Administered 2021-02-20: 4.6 mg via INTRAVENOUS
  Administered 2021-02-20: 1.4 mg via INTRAVENOUS
  Administered 2021-02-20 – 2021-02-21 (×2): 1.6 mg via INTRAVENOUS
  Administered 2021-02-21: 3 mg via INTRAVENOUS
  Administered 2021-02-21: 0.8 mg via INTRAVENOUS
  Administered 2021-02-21: 5.4 mg via INTRAVENOUS
  Administered 2021-02-21: 30 mg via INTRAVENOUS
  Administered 2021-02-22: 1.5 mg via INTRAVENOUS
  Administered 2021-02-22: 2.2 mg via INTRAVENOUS
  Administered 2021-02-22: 2 mg via INTRAVENOUS
  Administered 2021-02-22: 0.8 mg via INTRAVENOUS
  Administered 2021-02-22: 1.6 mg via INTRAVENOUS
  Administered 2021-02-23: 30 mg via INTRAVENOUS
  Administered 2021-02-23: 3.8 mg via INTRAVENOUS
  Administered 2021-02-23: 4.6 mg via INTRAVENOUS
  Administered 2021-02-23: 0.25 mg via INTRAVENOUS
  Administered 2021-02-23: 2.8 mg via INTRAVENOUS
  Administered 2021-02-23: 3.4 mg via INTRAVENOUS
  Administered 2021-02-24: 2.6 mg via INTRAVENOUS
  Administered 2021-02-24: 2.8 mg via INTRAVENOUS
  Administered 2021-02-24: 1.8 mg via INTRAVENOUS
  Administered 2021-02-24: 2 mg via INTRAVENOUS
  Administered 2021-02-24: 3.6 mg via INTRAVENOUS
  Administered 2021-02-25: 1.6 mg via INTRAVENOUS
  Administered 2021-02-25: 2 mg via INTRAVENOUS
  Administered 2021-02-25: 4 mg via INTRAVENOUS
  Administered 2021-02-25: 3.6 mg via INTRAVENOUS
  Administered 2021-02-26: 1.8 mg via INTRAVENOUS
  Administered 2021-02-26: 3.6 mg via INTRAVENOUS
  Administered 2021-02-27: 1.2 mg via INTRAVENOUS
  Administered 2021-02-27: 30 mg via INTRAVENOUS
  Administered 2021-02-27: 2.4 mg via INTRAVENOUS
  Administered 2021-02-27: 4 mg via INTRAVENOUS
  Administered 2021-02-28: 4.4 mg via INTRAVENOUS
  Administered 2021-02-28: 1.2 mg via INTRAVENOUS
  Administered 2021-02-28: 1.8 mg via INTRAVENOUS
  Administered 2021-03-01: 4.2 mg via INTRAVENOUS
  Administered 2021-03-01: 2.8 mL via INTRAVENOUS
  Administered 2021-03-01: 114 mL via INTRAVENOUS
  Administered 2021-03-01: 0 mg via INTRAVENOUS
  Administered 2021-03-01: 30 mg via INTRAVENOUS
  Administered 2021-03-01: 5.8 mL via INTRAVENOUS
  Administered 2021-03-02: 2.4 mL via INTRAVENOUS
  Administered 2021-03-02: 0.4 mg via INTRAVENOUS
  Administered 2021-03-02: 9.8 mL via INTRAVENOUS
  Administered 2021-03-02: 1.3 mL via INTRAVENOUS
  Administered 2021-03-02: 4.4 mg via INTRAVENOUS
  Administered 2021-03-03: 2.6 mg via INTRAVENOUS
  Administered 2021-03-03: 2.8 mg via INTRAVENOUS
  Administered 2021-03-03: 1.4 mg via INTRAVENOUS
  Administered 2021-03-03: 3 mg via INTRAVENOUS
  Administered 2021-03-03: 2 mg via INTRAVENOUS
  Administered 2021-03-03 (×2): 1 mg via INTRAVENOUS
  Administered 2021-03-04: 1.8 mg via INTRAVENOUS
  Administered 2021-03-04: 3.4 mg via INTRAVENOUS
  Administered 2021-03-04: 1.4 mg via INTRAVENOUS
  Administered 2021-03-04: 2.8 mg via INTRAVENOUS
  Administered 2021-03-04: 3.2 mg via INTRAVENOUS
  Administered 2021-03-04: 3.4 mg via INTRAVENOUS
  Administered 2021-03-05: 4 mg via INTRAVENOUS
  Administered 2021-03-05: 0.4 mg via INTRAVENOUS
  Administered 2021-03-05: 2.6 mg via INTRAVENOUS
  Administered 2021-03-05: 4 mg via INTRAVENOUS
  Administered 2021-03-05: 2.2 mg via INTRAVENOUS
  Administered 2021-03-05: 1.4 mg via INTRAVENOUS
  Administered 2021-03-06: 0.8 mg via INTRAVENOUS
  Administered 2021-03-06: 4.6 mg via INTRAVENOUS
  Administered 2021-03-07: 1.2 mg via INTRAVENOUS
  Administered 2021-03-07: 0.8 mg via INTRAVENOUS
  Administered 2021-03-07: 4 mg via INTRAVENOUS
  Administered 2021-03-08: 3.8 mg via INTRAVENOUS
  Administered 2021-03-08: 3.4 mg via INTRAVENOUS
  Administered 2021-03-08: 30 mg via INTRAVENOUS
  Administered 2021-03-08: 1 mg via INTRAVENOUS
  Administered 2021-03-08: 3.2 mg via INTRAVENOUS
  Administered 2021-03-09: 1 mg via INTRAVENOUS
  Administered 2021-03-09: 2.2 mg via INTRAVENOUS
  Administered 2021-03-10: 0.8 mg via INTRAVENOUS
  Administered 2021-03-10: 2 mg via INTRAVENOUS
  Administered 2021-03-10: 3.4 mg via INTRAVENOUS
  Administered 2021-03-10: 0.4 mg via INTRAVENOUS
  Administered 2021-03-10: 2.6 mg via INTRAVENOUS
  Administered 2021-03-10: 2.6 mL via INTRAVENOUS
  Administered 2021-03-10: 1.4 mg via INTRAVENOUS
  Administered 2021-03-11: 2 mg via INTRAVENOUS
  Administered 2021-03-11: 1.4 mg via INTRAVENOUS
  Administered 2021-03-11: 0.6 mg via INTRAVENOUS
  Administered 2021-03-11: 1.6 mg via INTRAVENOUS
  Administered 2021-03-11: 3.2 mg via INTRAVENOUS
  Administered 2021-03-11: 0.6 mg via INTRAVENOUS
  Administered 2021-03-12: 2.6 mg via INTRAVENOUS
  Administered 2021-03-12: 2.8 mg via INTRAVENOUS
  Administered 2021-03-12: 1.8 mg via INTRAVENOUS
  Administered 2021-03-12: 0.2 mg via INTRAVENOUS
  Administered 2021-03-13: 1 mg via INTRAVENOUS
  Administered 2021-03-13: 3 mg via INTRAVENOUS
  Administered 2021-03-13: 2.2 mg via INTRAVENOUS
  Administered 2021-03-13: 3 mg via INTRAVENOUS
  Administered 2021-03-13: 0.8 mg via INTRAVENOUS
  Administered 2021-03-13: 3 mg via INTRAVENOUS
  Administered 2021-03-14: 1.4 mg via INTRAVENOUS
  Administered 2021-03-14: 3 mg via INTRAVENOUS
  Administered 2021-03-14: 2.8 mg via INTRAVENOUS
  Filled 2021-02-16 (×14): qty 30

## 2021-02-16 MED ORDER — SODIUM CHLORIDE 0.9% IV SOLUTION: Freq: Once | INTRAVENOUS | Status: AC

## 2021-02-16 MED ORDER — SODIUM CHLORIDE 0.9% FLUSH: 9.0000 mL | INTRAVENOUS | Status: DC | PRN

## 2021-02-16 MED ORDER — DIPHENHYDRAMINE HCL 12.5 MG/5ML PO ELIX
12.5000 mg | ORAL_SOLUTION | Freq: Four times a day (QID) | ORAL | Status: DC | PRN
  Administered 2021-03-09 – 2021-03-14 (×10): 12.5 mg via ORAL
  Filled 2021-02-16 (×10): qty 5

## 2021-02-16 MED ORDER — MAGNESIUM SULFATE 2 GM/50ML IV SOLN
2.0000 g | Freq: Once | INTRAVENOUS | Status: AC
  Administered 2021-02-16: 2 g via INTRAVENOUS
  Filled 2021-02-16: qty 50

## 2021-02-16 MED ORDER — NALOXONE HCL 0.4 MG/ML IJ SOLN: 0.4000 mg | INTRAMUSCULAR | Status: DC | PRN

## 2021-02-16 NOTE — TOC Progression Note (Signed)
Transition of Care [facility] 2) - Progression Note    Patient Details  Name: John Ware MRN: [id number] Date of Birth: 1966/05/02  Transition of Care [facility]) CM/SW Contact  Pantera Winterrowd, Olegario Messier, RN Phone Number: 02/16/2021, 12:09 PM  Clinical Narrative:  Noted not appropriate for CIR. PT to continue to provide therapy to asst next level of care.     Expected Discharge Plan: Skilled Nursing Facility Barriers to Discharge: Continued Medical Work up  Expected Discharge Plan and Services Expected Discharge Plan: Skilled Nursing Facility   Discharge Planning Services: CM Consult   Living arrangements for the past 2 months: Single Family Home                                       Social Determinants of Health (SDOH) Interventions    Readmission Risk Interventions No flowsheet data found.

## 2021-02-16 NOTE — Consult Note (Signed)
WOC Nurse wound follow up Patient receiving care in [facility]. Introduced myself upon entry into room and explained the PA and I would be changing the chest VAC dressing. The patient very adamantly stated, "no one is doing anything with this VAC unless they take me to the OR".  I explained I would relay this information to the PA, which I did.  For now, there is nothing the WOC is needed for.  Kirstie Mirza requested I keep the patient on the WOC f/u list just in case we are needed Monday.  I have updated the f/u list to reflect this. Helmut Muster, RN, MSN, CWOCN, CNS-BC, pager [phone]

## 2021-02-16 NOTE — Progress Notes (Signed)
ID Brief Note  Patient had a drain placement by IR on 6/23 at the rt and left paraspinal abscess and left spuraclavicular area. Cultures with GPC  Plan for repeat imaging and Wash out by Ortho early next week  Continue current antibiotics  Monitor CBC and BMP  Dr Renold Don is available as needed this weekend. I will follow him up on Monday.  Odette Fraction, MD Infectious Disease Physician [facility] [address] [place], [place] [address] Phone: [phone]  Fax: [phone]

## 2021-02-16 NOTE — Progress Notes (Signed)
9:30a- Patient very upset wanting to see the president of the hospital and call police. CN entered room and patient stated that he will not have his wound vac changed unless he is put to sleep. Patient states that he cannot handle the pain of dressing changes. As patient presents his concerns, surgery Maczis PA entered room and patient very upset using profanity at PA and CN. Education attempted by both PA and CN regarding wound and what could happen if patient does not allow [place] to change wound vac dressing. Patient refuses to have PA in room. CN continued deescalation.  10:15a Different pain management determined by PA and MD was presented to patient by CN. Patient agrees to try alternative pain management to support pain during wound vac dressing change. Maczis PA informed of patient decision. Orders received.  2p: Patient with PCA pump and Ativan 1mg  administered. WOC nurse to bedside for dressing change. Patient still screams out in pain during dressing change. Nephew at bedside to support patient. Dressing change completed. Patient repositioned and resting after dressing change completion.

## 2021-02-16 NOTE — Progress Notes (Signed)
Patient scheduled for wound vac change of the left chest wall today. I was called by WOCN that patient declined wound vac change at bedside this morning. I went to the room to assess the patient. Upon entering the room the patient was speaking with charge RN. He expressed that he would not allow [place] to assess or change his wound vac at bedside as it is to painful. I offered changing his pain medication to something different if he felt his current regimen was not adequate to control his pain for bedside. He declined this, stating that he would only allow his wound vac to be changed in the OR when he is sedated. I expressed that unfortunately we do not have any OR availability today. I expressed that after speaking with my attending who assessed the patient this am, that the vac does not have adequate seal over all portions of his wound and likely will need to be removed. It has also been in place since Tuesday and due for changing today. I expressed my concern that without adequate seal, I am concerned that his wound may worsen which could lead to the patient becoming more ill, his wounds worsening and the infection spreading. After expressing this, I again asked if he would allow me assess the wound and change his vac the patient stated "I would rather die". He began raising his voice and using foul language. I was unable to assess the wound. I discussed with my attending. Will plan to see again tomorrow to determine if vac can be changed. Please call us back sooner if patient is agreeable to vac change at bedside. Could consider other pain control, as I previously offered, if patient is willing to allow [place] to change this at bedside. Otherwise we may have to change this in the OR when there becomes availability. Above was discussed with my attending.   Leary Roca, [facility] Surgery 10:04 AM, 02/16/2021

## 2021-02-16 NOTE — Progress Notes (Signed)
6 Days Post-Op   Chief Complaint/Subjective: IR drains yesterday, some increased movement in arms  Objective: Vital signs in last 24 hours: Temp:  [97.8 F (36.6 C)-101 F (38.3 C)] 99.4 F (37.4 C) (06/24 0515) Pulse Rate:  [73-103] 91 (06/24 0515) Resp:  [16-22] 20 (06/24 0515) BP: (105-159)/(65-95) 127/80 (06/24 0515) SpO2:  [94 %-100 %] 100 % (06/24 0515) Last BM Date: 02/14/21 Intake/Output from previous day: 06/23 0701 - 06/24 0700 In: 630 [P.O.:520; IV Piggyback:100] Out: 4025 [Urine:3000; Drains:1025] Intake/Output this shift: No intake/output data recorded.  PE: Gen: NAD Resp: nonlabored Card: RRR Abd: soft Skin: vac on in back, nonfunctioning  Lab Results:  Recent Labs    02/15/21 0558 02/16/21 0438  WBC 10.5 9.8  HGB 7.7* 7.0*  HCT 24.1* 22.1*  PLT 537* 456*   BMET Recent Labs    02/15/21 0558 02/16/21 0438  NA 131* 131*  K 4.1 4.4  CL 93* 95*  CO2 30 28  GLUCOSE 126* 138*  BUN 9 7  CREATININE 0.59* 0.63  CALCIUM 8.3* 8.1*   PT/INR No results for input(s): LABPROT, INR in the last 72 hours. CMP     Component Value Date/Time   NA 131 (L) 02/16/2021 0438   NA 137 10/09/2020 1155   K 4.4 02/16/2021 0438   CL 95 (L) 02/16/2021 0438   CO2 28 02/16/2021 0438   GLUCOSE 138 (H) 02/16/2021 0438   BUN 7 02/16/2021 0438   BUN 11 10/09/2020 1155   CREATININE 0.63 02/16/2021 0438   CALCIUM 8.1 (L) 02/16/2021 0438   PROT 5.2 (L) 02/14/2021 0212   PROT 7.6 10/09/2020 1155   ALBUMIN 1.8 (L) 02/14/2021 0212   ALBUMIN 4.7 10/09/2020 1155   AST 53 (H) 02/14/2021 0212   ALT 30 02/14/2021 0212   ALKPHOS 53 02/14/2021 0212   BILITOT 0.7 02/14/2021 0212   BILITOT 0.8 10/09/2020 1155   GFRNONAA >60 02/16/2021 0438   GFRAA 113 10/09/2020 1155   Lipase  No results found for: LIPASE  Studies/Results: MR CERVICAL SPINE W WO CONTRAST  Result Date: 02/15/2021 CLINICAL DATA:  55 year old male with history of disseminated MSSA infection, evaluate  for osteomyelitis discitis. EXAM: MRI CERVICAL, THORACIC AND LUMBAR SPINE WITHOUT AND WITH CONTRAST TECHNIQUE: Multiplanar and multiecho pulse sequences of the cervical spine, to include the craniocervical junction and cervicothoracic junction, and thoracic and lumbar spine, were obtained without and with intravenous contrast. CONTRAST:  12mL GADAVIST GADOBUTROL 1 MMOL/ML IV SOLN COMPARISON:  Prior CT from 02/13/2021. FINDINGS: MRI CERVICAL SPINE FINDINGS Alignment: Examination somewhat technically limited as the patient was unable to complete the entirety of the exam. No postcontrast images of the cervical spine are provided. Mild straightening of the normal cervical lordosis.  No listhesis. Vertebrae: Vertebral body height maintained without acute or chronic fracture. Diffusely decreased T1 signal intensity seen throughout the visualized bone marrow, nonspecific, but most commonly related to anemia, smoking, or obesity. No discrete or worrisome osseous lesions. No findings to suggest osteomyelitis discitis or septic arthritis within the cervical spine. Cord: Normal signal and morphology. No epidural abscess or other collection. Posterior Fossa, vertebral arteries, paraspinal tissues: Partially empty sella noted. Visualized brain and posterior fossa otherwise unremarkable. Craniocervical junction normal. Known fluid collections/abscess involving the left chest wall are partially visualized. Largest loculated component seen at the left upper posterior chest and measures 5.3 x 1.6 cm (series 34, image 34). Additional separate and partially visualized loculated component position slightly laterally measures 4.3 x 1.6 cm (series  34, image 3). Scattered foci of internal susceptibility artifact consistent with gas. No extension to involve the underlying spinal canal or vertebral column. Associated scattered ill-defined edema noted within the musculature of the visualized upper posterior chest. Prevertebral soft tissues  are within normal limits. Normal flow voids seen within the vertebral arteries bilaterally. Disc levels: C2-C3: Mild uncovertebral hypertrophy without significant disc bulge. Right worse than left facet hypertrophy. No spinal stenosis. Mild to moderate right C3 foraminal narrowing. Left neural foramina remains patent. C3-C4: Left paracentral disc protrusion with slight inferior migration indents the left ventral thecal sac (series 33, image 10). Mild flattening of the left ventral cord without cord signal changes. Mild spinal stenosis. Superimposed uncovertebral and facet hypertrophy with resultant mild to moderate right worse than left C4 foraminal stenosis. C4-C5: Disc desiccation. Broad-based central disc protrusion indents the ventral thecal sac, eccentric to the left (series 33, image 15). Superimposed uncovertebral and facet hypertrophy. Resultant mild to moderate spinal stenosis. Severe right worse than left C5 foraminal narrowing. C5-C6: Small right paracentral disc protrusion indents the ventral thecal sac (series 34, image 25). Mild spinal stenosis without cord deformity. Superimposed bilateral facet hypertrophy. Mild to moderate left worse than right C6 foraminal narrowing. C6-C7: Disc desiccation with diffuse disc bulge. Superimposed central disc extrusion with inferior migration. Extruded disc material contacts and mildly flattens the ventral cord. No cord signal changes. Moderate spinal stenosis. Superimposed facet and uncovertebral hypertrophy with resultant severe right worse than left C7 foraminal stenosis. C7-T1: Small amount of extruded disc material extends from the C6-7 level above. No other disc pathology at this level. Right worse than left facet hypertrophy. No significant spinal stenosis. Mild right C8 foraminal narrowing. Left neural foramina remains patent. MRI THORACIC SPINE FINDINGS Alignment:  Examination technically limited by motion artifact. Normal alignment with preservation of the  normal thoracic kyphosis. No listhesis. Vertebrae: Vertebral body height maintained without acute or chronic fracture. Bone marrow signal intensity diffusely decreased on T1 weighted imaging. 1.5 cm benign hemangioma noted within the T7 vertebral body. No worrisome osseous lesions. Abnormal marrow edema and enhancement seen involving the T3, T4, T5, and T6 vertebral bodies, suspicious for acute osteomyelitis. Probable involvement of the anterior aspects of the T10 and T11 vertebral bodies as well (series 37, image 4) no convincing evidence for intervening discitis at this time. Mild marrow edema seen about the right T3-4 and T4-5 facets, suspected to reflect concomitant septic arthritis (series 37, image 2). Cord: No convincing cord signal abnormality seen on this motion degraded exam. Thin enhancement seen involving the ventral epidural space at T3 through T6, likely related to mild infection/phlegmon (series 37, image 6). Possible scattered dorsal involvement noted as well. No frank epidural abscess. Paraspinal and other soft tissues: Known left chest wall collections/abscesses are partially visualized, with largest component seen at the upper left posterior chest measuring 11.8 x 1.9 cm (series 38, image 4). Mild paraspinous phlegmon/enhancement seen adjacent to the infected vertebral bodies, most pronounced at T3-4 and T4-5 (series 39, image 10, 15) no frank paraspinous abscess or collection. Mildly irregular layering bilateral pleural effusions, left greater than right partially visualized. Atelectasis and/or infiltrates noted within the partially visualized lungs. Disc levels: T1-2: Negative interspace. Bilateral facet hypertrophy. No stenosis. T2-3: Facet hypertrophy. Probable right foraminal disc protrusion. No significant spinal stenosis. Mild bilateral foraminal narrowing. T3-4: Negative interspace. Bilateral facet hypertrophy with possible septic arthritis on the right. No significant stenosis. T4-5:  Central/right paracentral disc protrusion indents the ventral thecal sac. Flattening of  the right ventral cord. Posterior element hypertrophy. Mild spinal stenosis. Foramina remain patent. T5-6: Right paracentral disc protrusion indents the right ventral thecal sac, contacting and flattening the right cord. Moderate spinal stenosis. Foramina remain patent. T6-7: Central/left paracentral disc extrusion with superior migration contacts and flattens the ventral cord. Posterior element hypertrophy with prominence of the epidural fat. Mild to moderate spinal stenosis. Foramina remain patent. T7-8: Shallow left paracentral disc protrusion. Prominence of the dorsal epidural fat with mild posterior element hypertrophy. Mild spinal stenosis. Foramina remain patent. T8-9: Small central irregular disc protrusion. Mild posterior element hypertrophy. Mild spinal stenosis. Foramina remain patent. T9-10: Left paracentral to foraminal disc protrusion. Posterior element hypertrophy. Mild spinal stenosis. Mild left neural foraminal narrowing. T10-11: Right paracentral disc protrusion with superior migration. Moderate facet hypertrophy. Mild flattening of the ventral cord with mild-to-moderate spinal stenosis. Moderate bilateral foraminal narrowing. T11-12: Right paracentral disc extrusion with superior migration. Secondary flattening of the right ventral cord. Moderate facet hypertrophy. Resultant moderate canal with bilateral foraminal stenosis. T12-L1: Right paracentral disc protrusion. Moderate facet hypertrophy. No significant stenosis. MRI LUMBAR SPINE FINDINGS Segmentation:  Standard. Alignment: 7 mm anterolisthesis of L5 on S1. Trace retrolisthesis of L1 on L2 and L2 on L3. Vertebrae: Sequelae of prior PLIF at L3 through S1. Vertebral body height maintained without acute or chronic fracture. Diffusely decreased T1 signal intensity within the underlying bone marrow. There is abnormal edema involving the L2-3 disc, with marrow  edema involving the adjacent L2 and L3 vertebral bodies, consistent with osteomyelitis discitis. Suspected additional involvement of the adjacent L1-2 disc as well. Conus medullaris and cauda equina: Conus extends to the L1 level. Conus and cauda equina appear normal. No visible significant epidural involvement or epidural abscess. Paraspinal and other soft tissues: Abnormal edema with multifocal abscesses seen involving the psoas muscles bilaterally. Largest of these seen on the right and measures 3.7 x 2.8 cm (series 28, image 30). Additional collection at the laminectomy site within the posterior soft tissues measures 2.9 x 2.1 x 5.9 cm, felt to be most consistent with a benign postoperative seroma. Additional chronic postoperative changes noted elsewhere within the posterior paraspinous soft tissues. No visible SI joint involvement. Disc levels: L1-2: Retrolisthesis. Intervertebral disc space narrowing with mild disc bulge. Moderate facet hypertrophy. No significant spinal stenosis. Mild bilateral L1 foraminal narrowing. L2-3: Trace retrolisthesis. Mild disc bulge. Advanced facet and ligament flavum hypertrophy. Resultant moderate spinal stenosis. Moderate bilateral L2 foraminal narrowing. L3-4:  Prior PLIF.  No residual stenosis. L4-5:  Prior PLIF.  No residual stenosis. L5-S1: Chronic 7 mm anterolisthesis with prior PLIF. No significant residual spinal stenosis. Probable residual mild to moderate foraminal narrowing largely due to slippage. IMPRESSION: MRI CERVICAL SPINE IMPRESSION: 1. No evidence for osteomyelitis discitis within the cervical spine. No epidural abscess or other abnormality. 2. Partially visualized collections/abscesses involving the left upper chest wall, better seen on recent CT examinations. No extension to involve the underlying spinal canal or vertebral column. 3. Multilevel degenerative spondylosis with resultant mild to moderate spinal stenosis at C3-4 through C6-7 as above. Severe  right worse than left C5 and C7 foraminal stenosis. MRI THORACIC SPINE IMPRESSION: 1. Findings consistent with acute osteomyelitis involving the T3, T4, T5, and T6 vertebral bodies, with probable involvement of the anterior aspect of the T10 and T11 vertebral bodies. No convincing evidence for intervening discitis at this time. 2. Mild marrow edema and enhancement involving the right T3-4 and T4-5 facets, consistent with concomitant septic arthritis. 3. Enhancement involving the  ventral epidural space at T3 through T6, consistent with mild/early epidural involvement. No frank epidural abscess. 4. Partially visualized left chest wall abscesses/collections with bilateral pleural effusions. 5. Multilevel degenerative spondylosis with resultant mild to moderate diffuse spinal stenosis throughout the thoracic spine as detailed above. MRI LUMBAR SPINE IMPRESSION: 1. 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 No appreciable epidural involvement at this time. 2. Multifocal abscesses involving the bilateral psoas musculature, largest of which on the right measures up to 3.7 cm. 3. Postoperative changes from prior PLIF at L3-4 through L5-S1 without residual spinal stenosis. Collection at the posterior laminectomy site felt to be most consistent with a benign postoperative seroma. 4. 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 Electronically Signed   By: Rise Mu M.D.   On: 02/15/2021 03:11   MR THORACIC SPINE W WO CONTRAST  Result Date: 02/15/2021 CLINICAL DATA:  55 year old male with history of disseminated MSSA infection, evaluate for osteomyelitis discitis. EXAM: MRI CERVICAL, THORACIC AND LUMBAR SPINE WITHOUT AND WITH CONTRAST TECHNIQUE: Multiplanar and multiecho pulse sequences of the cervical spine, to include the craniocervical junction and cervicothoracic junction, and thoracic and lumbar spine, were obtained  without and with intravenous contrast. CONTRAST:  10mL GADAVIST GADOBUTROL 1 MMOL/ML IV SOLN COMPARISON:  Prior CT from 02/13/2021. FINDINGS: MRI CERVICAL SPINE FINDINGS Alignment: Examination somewhat technically limited as the patient was unable to complete the entirety of the exam. No postcontrast images of the cervical spine are provided. Mild straightening of the normal cervical lordosis.  No listhesis. Vertebrae: Vertebral body height maintained without acute or chronic fracture. Diffusely decreased T1 signal intensity seen throughout the visualized bone marrow, nonspecific, but most commonly related to anemia, smoking, or obesity. No discrete or worrisome osseous lesions. No findings to suggest osteomyelitis discitis or septic arthritis within the cervical spine. Cord: Normal signal and morphology. No epidural abscess or other collection. Posterior Fossa, vertebral arteries, paraspinal tissues: Partially empty sella noted. Visualized brain and posterior fossa otherwise unremarkable. Craniocervical junction normal. Known fluid collections/abscess involving the left chest wall are partially visualized. Largest loculated component seen at the left upper posterior chest and measures 5.3 x 1.6 cm (series 34, image 34). Additional separate and partially visualized loculated component position slightly laterally measures 4.3 x 1.6 cm (series 34, image 3). Scattered foci of internal susceptibility artifact consistent with gas. No extension to involve the underlying spinal canal or vertebral column. Associated scattered ill-defined edema noted within the musculature of the visualized upper posterior chest. Prevertebral soft tissues are within normal limits. Normal flow voids seen within the vertebral arteries bilaterally. Disc levels: C2-C3: Mild uncovertebral hypertrophy without significant disc bulge. Right worse than left facet hypertrophy. No spinal stenosis. Mild to moderate right C3 foraminal narrowing. Left  neural foramina remains patent. C3-C4: Left paracentral disc protrusion with slight inferior migration indents the left ventral thecal sac (series 33, image 10). Mild flattening of the left ventral cord without cord signal changes. Mild spinal stenosis. Superimposed uncovertebral and facet hypertrophy with resultant mild to moderate right worse than left C4 foraminal stenosis. C4-C5: Disc desiccation. Broad-based central disc protrusion indents the ventral thecal sac, eccentric to the left (series 33, image 15). Superimposed uncovertebral and facet hypertrophy. Resultant mild to moderate spinal stenosis. Severe right worse than left C5 foraminal narrowing. C5-C6: Small right paracentral disc protrusion indents the ventral thecal sac (series 34, image 25). Mild spinal stenosis without cord deformity. Superimposed bilateral facet hypertrophy. Mild  to moderate left worse than right C6 foraminal narrowing. C6-C7: Disc desiccation with diffuse disc bulge. Superimposed central disc extrusion with inferior migration. Extruded disc material contacts and mildly flattens the ventral cord. No cord signal changes. Moderate spinal stenosis. Superimposed facet and uncovertebral hypertrophy with resultant severe right worse than left C7 foraminal stenosis. C7-T1: Small amount of extruded disc material extends from the C6-7 level above. No other disc pathology at this level. Right worse than left facet hypertrophy. No significant spinal stenosis. Mild right C8 foraminal narrowing. Left neural foramina remains patent. MRI THORACIC SPINE FINDINGS Alignment:  Examination technically limited by motion artifact. Normal alignment with preservation of the normal thoracic kyphosis. No listhesis. Vertebrae: Vertebral body height maintained without acute or chronic fracture. Bone marrow signal intensity diffusely decreased on T1 weighted imaging. 1.5 cm benign hemangioma noted within the T7 vertebral body. No worrisome osseous lesions.  Abnormal marrow edema and enhancement seen involving the T3, T4, T5, and T6 vertebral bodies, suspicious for acute osteomyelitis. Probable involvement of the anterior aspects of the T10 and T11 vertebral bodies as well (series 37, image 4) no convincing evidence for intervening discitis at this time. Mild marrow edema seen about the right T3-4 and T4-5 facets, suspected to reflect concomitant septic arthritis (series 37, image 2). Cord: No convincing cord signal abnormality seen on this motion degraded exam. Thin enhancement seen involving the ventral epidural space at T3 through T6, likely related to mild infection/phlegmon (series 37, image 6). Possible scattered dorsal involvement noted as well. No frank epidural abscess. Paraspinal and other soft tissues: Known left chest wall collections/abscesses are partially visualized, with largest component seen at the upper left posterior chest measuring 11.8 x 1.9 cm (series 38, image 4). Mild paraspinous phlegmon/enhancement seen adjacent to the infected vertebral bodies, most pronounced at T3-4 and T4-5 (series 39, image 10, 15) no frank paraspinous abscess or collection. Mildly irregular layering bilateral pleural effusions, left greater than right partially visualized. Atelectasis and/or infiltrates noted within the partially visualized lungs. Disc levels: T1-2: Negative interspace. Bilateral facet hypertrophy. No stenosis. T2-3: Facet hypertrophy. Probable right foraminal disc protrusion. No significant spinal stenosis. Mild bilateral foraminal narrowing. T3-4: Negative interspace. Bilateral facet hypertrophy with possible septic arthritis on the right. No significant stenosis. T4-5: Central/right paracentral disc protrusion indents the ventral thecal sac. Flattening of the right ventral cord. Posterior element hypertrophy. Mild spinal stenosis. Foramina remain patent. T5-6: Right paracentral disc protrusion indents the right ventral thecal sac, contacting and  flattening the right cord. Moderate spinal stenosis. Foramina remain patent. T6-7: Central/left paracentral disc extrusion with superior migration contacts and flattens the ventral cord. Posterior element hypertrophy with prominence of the epidural fat. Mild to moderate spinal stenosis. Foramina remain patent. T7-8: Shallow left paracentral disc protrusion. Prominence of the dorsal epidural fat with mild posterior element hypertrophy. Mild spinal stenosis. Foramina remain patent. T8-9: Small central irregular disc protrusion. Mild posterior element hypertrophy. Mild spinal stenosis. Foramina remain patent. T9-10: Left paracentral to foraminal disc protrusion. Posterior element hypertrophy. Mild spinal stenosis. Mild left neural foraminal narrowing. T10-11: Right paracentral disc protrusion with superior migration. Moderate facet hypertrophy. Mild flattening of the ventral cord with mild-to-moderate spinal stenosis. Moderate bilateral foraminal narrowing. T11-12: Right paracentral disc extrusion with superior migration. Secondary flattening of the right ventral cord. Moderate facet hypertrophy. Resultant moderate canal with bilateral foraminal stenosis. T12-L1: Right paracentral disc protrusion. Moderate facet hypertrophy. No significant stenosis. MRI LUMBAR SPINE FINDINGS Segmentation:  Standard. Alignment: 7 mm anterolisthesis of L5 on S1.  Trace retrolisthesis of L1 on L2 and L2 on L3. Vertebrae: Sequelae of prior PLIF at L3 through S1. Vertebral body height maintained without acute or chronic fracture. Diffusely decreased T1 signal intensity within the underlying bone marrow. There is abnormal edema involving the L2-3 disc, with marrow edema involving the adjacent L2 and L3 vertebral bodies, consistent with osteomyelitis discitis. Suspected additional involvement of the adjacent L1-2 disc as well. Conus medullaris and cauda equina: Conus extends to the L1 level. Conus and cauda equina appear normal. No visible  significant epidural involvement or epidural abscess. Paraspinal and other soft tissues: Abnormal edema with multifocal abscesses seen involving the psoas muscles bilaterally. Largest of these seen on the right and measures 3.7 x 2.8 cm (series 28, image 30). Additional collection at the laminectomy site within the posterior soft tissues measures 2.9 x 2.1 x 5.9 cm, felt to be most consistent with a benign postoperative seroma. Additional chronic postoperative changes noted elsewhere within the posterior paraspinous soft tissues. No visible SI joint involvement. Disc levels: L1-2: Retrolisthesis. Intervertebral disc space narrowing with mild disc bulge. Moderate facet hypertrophy. No significant spinal stenosis. Mild bilateral L1 foraminal narrowing. L2-3: Trace retrolisthesis. Mild disc bulge. Advanced facet and ligament flavum hypertrophy. Resultant moderate spinal stenosis. Moderate bilateral L2 foraminal narrowing. L3-4:  Prior PLIF.  No residual stenosis. L4-5:  Prior PLIF.  No residual stenosis. L5-S1: Chronic 7 mm anterolisthesis with prior PLIF. No significant residual spinal stenosis. Probable residual mild to moderate foraminal narrowing largely due to slippage. IMPRESSION: MRI CERVICAL SPINE IMPRESSION: 1. No evidence for osteomyelitis discitis within the cervical spine. No epidural abscess or other abnormality. 2. Partially visualized collections/abscesses involving the left upper chest wall, better seen on recent CT examinations. No extension to involve the underlying spinal canal or vertebral column. 3. Multilevel degenerative spondylosis with resultant mild to moderate spinal stenosis at C3-4 through C6-7 as above. Severe right worse than left C5 and C7 foraminal stenosis. MRI THORACIC SPINE IMPRESSION: 1. Findings consistent with acute osteomyelitis involving the T3, T4, T5, and T6 vertebral bodies, with probable involvement of the anterior aspect of the T10 and T11 vertebral bodies. No convincing  evidence for intervening discitis at this time. 2. Mild marrow edema and enhancement involving the right T3-4 and T4-5 facets, consistent with concomitant septic arthritis. 3. Enhancement involving the ventral epidural space at T3 through T6, consistent with mild/early epidural involvement. No frank epidural abscess. 4. Partially visualized left chest wall abscesses/collections with bilateral pleural effusions. 5. Multilevel degenerative spondylosis with resultant mild to moderate diffuse spinal stenosis throughout the thoracic spine as detailed above. MRI LUMBAR SPINE IMPRESSION: 1. 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 No appreciable epidural involvement at this time. 2. Multifocal abscesses involving the bilateral psoas musculature, largest of which on the right measures up to 3.7 cm. 3. Postoperative changes from prior PLIF at L3-4 through L5-S1 without residual spinal stenosis. Collection at the posterior laminectomy site felt to be most consistent with a benign postoperative seroma. 4. 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 Electronically Signed   By: Rise Mu M.D.   On: 02/15/2021 03:11   MR Lumbar Spine W Wo Contrast  Result Date: 02/15/2021 CLINICAL DATA:  55 year old male with history of disseminated MSSA infection, evaluate for osteomyelitis discitis. EXAM: MRI CERVICAL, THORACIC AND LUMBAR SPINE WITHOUT AND WITH CONTRAST TECHNIQUE: Multiplanar and multiecho pulse sequences of the cervical spine, to include the craniocervical junction  and cervicothoracic junction, and thoracic and lumbar spine, were obtained without and with intravenous contrast. CONTRAST:  12mL GADAVIST GADOBUTROL 1 MMOL/ML IV SOLN COMPARISON:  Prior CT from 02/13/2021. FINDINGS: MRI CERVICAL SPINE FINDINGS Alignment: Examination somewhat technically limited as the patient was unable to complete the entirety of the exam. No  postcontrast images of the cervical spine are provided. Mild straightening of the normal cervical lordosis.  No listhesis. Vertebrae: Vertebral body height maintained without acute or chronic fracture. Diffusely decreased T1 signal intensity seen throughout the visualized bone marrow, nonspecific, but most commonly related to anemia, smoking, or obesity. No discrete or worrisome osseous lesions. No findings to suggest osteomyelitis discitis or septic arthritis within the cervical spine. Cord: Normal signal and morphology. No epidural abscess or other collection. Posterior Fossa, vertebral arteries, paraspinal tissues: Partially empty sella noted. Visualized brain and posterior fossa otherwise unremarkable. Craniocervical junction normal. Known fluid collections/abscess involving the left chest wall are partially visualized. Largest loculated component seen at the left upper posterior chest and measures 5.3 x 1.6 cm (series 34, image 34). Additional separate and partially visualized loculated component position slightly laterally measures 4.3 x 1.6 cm (series 34, image 3). Scattered foci of internal susceptibility artifact consistent with gas. No extension to involve the underlying spinal canal or vertebral column. Associated scattered ill-defined edema noted within the musculature of the visualized upper posterior chest. Prevertebral soft tissues are within normal limits. Normal flow voids seen within the vertebral arteries bilaterally. Disc levels: C2-C3: Mild uncovertebral hypertrophy without significant disc bulge. Right worse than left facet hypertrophy. No spinal stenosis. Mild to moderate right C3 foraminal narrowing. Left neural foramina remains patent. C3-C4: Left paracentral disc protrusion with slight inferior migration indents the left ventral thecal sac (series 33, image 10). Mild flattening of the left ventral cord without cord signal changes. Mild spinal stenosis. Superimposed uncovertebral and facet  hypertrophy with resultant mild to moderate right worse than left C4 foraminal stenosis. C4-C5: Disc desiccation. Broad-based central disc protrusion indents the ventral thecal sac, eccentric to the left (series 33, image 15). Superimposed uncovertebral and facet hypertrophy. Resultant mild to moderate spinal stenosis. Severe right worse than left C5 foraminal narrowing. C5-C6: Small right paracentral disc protrusion indents the ventral thecal sac (series 34, image 25). Mild spinal stenosis without cord deformity. Superimposed bilateral facet hypertrophy. Mild to moderate left worse than right C6 foraminal narrowing. C6-C7: Disc desiccation with diffuse disc bulge. Superimposed central disc extrusion with inferior migration. Extruded disc material contacts and mildly flattens the ventral cord. No cord signal changes. Moderate spinal stenosis. Superimposed facet and uncovertebral hypertrophy with resultant severe right worse than left C7 foraminal stenosis. C7-T1: Small amount of extruded disc material extends from the C6-7 level above. No other disc pathology at this level. Right worse than left facet hypertrophy. No significant spinal stenosis. Mild right C8 foraminal narrowing. Left neural foramina remains patent. MRI THORACIC SPINE FINDINGS Alignment:  Examination technically limited by motion artifact. Normal alignment with preservation of the normal thoracic kyphosis. No listhesis. Vertebrae: Vertebral body height maintained without acute or chronic fracture. Bone marrow signal intensity diffusely decreased on T1 weighted imaging. 1.5 cm benign hemangioma noted within the T7 vertebral body. No worrisome osseous lesions. Abnormal marrow edema and enhancement seen involving the T3, T4, T5, and T6 vertebral bodies, suspicious for acute osteomyelitis. Probable involvement of the anterior aspects of the T10 and T11 vertebral bodies as well (series 37, image 4) no convincing evidence for intervening discitis at this  time.  Mild marrow edema seen about the right T3-4 and T4-5 facets, suspected to reflect concomitant septic arthritis (series 37, image 2). Cord: No convincing cord signal abnormality seen on this motion degraded exam. Thin enhancement seen involving the ventral epidural space at T3 through T6, likely related to mild infection/phlegmon (series 37, image 6). Possible scattered dorsal involvement noted as well. No frank epidural abscess. Paraspinal and other soft tissues: Known left chest wall collections/abscesses are partially visualized, with largest component seen at the upper left posterior chest measuring 11.8 x 1.9 cm (series 38, image 4). Mild paraspinous phlegmon/enhancement seen adjacent to the infected vertebral bodies, most pronounced at T3-4 and T4-5 (series 39, image 10, 15) no frank paraspinous abscess or collection. Mildly irregular layering bilateral pleural effusions, left greater than right partially visualized. Atelectasis and/or infiltrates noted within the partially visualized lungs. Disc levels: T1-2: Negative interspace. Bilateral facet hypertrophy. No stenosis. T2-3: Facet hypertrophy. Probable right foraminal disc protrusion. No significant spinal stenosis. Mild bilateral foraminal narrowing. T3-4: Negative interspace. Bilateral facet hypertrophy with possible septic arthritis on the right. No significant stenosis. T4-5: Central/right paracentral disc protrusion indents the ventral thecal sac. Flattening of the right ventral cord. Posterior element hypertrophy. Mild spinal stenosis. Foramina remain patent. T5-6: Right paracentral disc protrusion indents the right ventral thecal sac, contacting and flattening the right cord. Moderate spinal stenosis. Foramina remain patent. T6-7: Central/left paracentral disc extrusion with superior migration contacts and flattens the ventral cord. Posterior element hypertrophy with prominence of the epidural fat. Mild to moderate spinal stenosis. Foramina  remain patent. T7-8: Shallow left paracentral disc protrusion. Prominence of the dorsal epidural fat with mild posterior element hypertrophy. Mild spinal stenosis. Foramina remain patent. T8-9: Small central irregular disc protrusion. Mild posterior element hypertrophy. Mild spinal stenosis. Foramina remain patent. T9-10: Left paracentral to foraminal disc protrusion. Posterior element hypertrophy. Mild spinal stenosis. Mild left neural foraminal narrowing. T10-11: Right paracentral disc protrusion with superior migration. Moderate facet hypertrophy. Mild flattening of the ventral cord with mild-to-moderate spinal stenosis. Moderate bilateral foraminal narrowing. T11-12: Right paracentral disc extrusion with superior migration. Secondary flattening of the right ventral cord. Moderate facet hypertrophy. Resultant moderate canal with bilateral foraminal stenosis. T12-L1: Right paracentral disc protrusion. Moderate facet hypertrophy. No significant stenosis. MRI LUMBAR SPINE FINDINGS Segmentation:  Standard. Alignment: 7 mm anterolisthesis of L5 on S1. Trace retrolisthesis of L1 on L2 and L2 on L3. Vertebrae: Sequelae of prior PLIF at L3 through S1. Vertebral body height maintained without acute or chronic fracture. Diffusely decreased T1 signal intensity within the underlying bone marrow. There is abnormal edema involving the L2-3 disc, with marrow edema involving the adjacent L2 and L3 vertebral bodies, consistent with osteomyelitis discitis. Suspected additional involvement of the adjacent L1-2 disc as well. Conus medullaris and cauda equina: Conus extends to the L1 level. Conus and cauda equina appear normal. No visible significant epidural involvement or epidural abscess. Paraspinal and other soft tissues: Abnormal edema with multifocal abscesses seen involving the psoas muscles bilaterally. Largest of these seen on the right and measures 3.7 x 2.8 cm (series 28, image 30). Additional collection at the laminectomy  site within the posterior soft tissues measures 2.9 x 2.1 x 5.9 cm, felt to be most consistent with a benign postoperative seroma. Additional chronic postoperative changes noted elsewhere within the posterior paraspinous soft tissues. No visible SI joint involvement. Disc levels: L1-2: Retrolisthesis. Intervertebral disc space narrowing with mild disc bulge. Moderate facet hypertrophy. No significant spinal stenosis. Mild bilateral L1 foraminal narrowing. L2-3:  Trace retrolisthesis. Mild disc bulge. Advanced facet and ligament flavum hypertrophy. Resultant moderate spinal stenosis. Moderate bilateral L2 foraminal narrowing. L3-4:  Prior PLIF.  No residual stenosis. L4-5:  Prior PLIF.  No residual stenosis. L5-S1: Chronic 7 mm anterolisthesis with prior PLIF. No significant residual spinal stenosis. Probable residual mild to moderate foraminal narrowing largely due to slippage. IMPRESSION: MRI CERVICAL SPINE IMPRESSION: 1. No evidence for osteomyelitis discitis within the cervical spine. No epidural abscess or other abnormality. 2. Partially visualized collections/abscesses involving the left upper chest wall, better seen on recent CT examinations. No extension to involve the underlying spinal canal or vertebral column. 3. Multilevel degenerative spondylosis with resultant mild to moderate spinal stenosis at C3-4 through C6-7 as above. Severe right worse than left C5 and C7 foraminal stenosis. MRI THORACIC SPINE IMPRESSION: 1. Findings consistent with acute osteomyelitis involving the T3, T4, T5, and T6 vertebral bodies, with probable involvement of the anterior aspect of the T10 and T11 vertebral bodies. No convincing evidence for intervening discitis at this time. 2. Mild marrow edema and enhancement involving the right T3-4 and T4-5 facets, consistent with concomitant septic arthritis. 3. Enhancement involving the ventral epidural space at T3 through T6, consistent with mild/early epidural involvement. No frank  epidural abscess. 4. Partially visualized left chest wall abscesses/collections with bilateral pleural effusions. 5. Multilevel degenerative spondylosis with resultant mild to moderate diffuse spinal stenosis throughout the thoracic spine as detailed above. MRI LUMBAR SPINE IMPRESSION: 1. 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 No appreciable epidural involvement at this time. 2. Multifocal abscesses involving the bilateral psoas musculature, largest of which on the right measures up to 3.7 cm. 3. Postoperative changes from prior PLIF at L3-4 through L5-S1 without residual spinal stenosis. Collection at the posterior laminectomy site felt to be most consistent with a benign postoperative seroma. 4. 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 Electronically Signed   By: Rise Mu M.D.   On: 02/15/2021 03:11   CT IMAGE GUIDED DRAINAGE BY PERCUTANEOUS CATHETER  Result Date: 02/16/2021 INDICATION: History of disseminated MSSA infection, now with bilateral paraspinal abscesses. Please perform CT-guided aspiration and/or drainage catheter placement for infection source control purposes. EXAM: CT GUIDED RETROPERITONEAL ABSCESS DRAINAGE CATHETER PLACEMENT X2 COMPARISON:  Normal lumbar spine MRI-02/15/2021 MEDICATIONS: The patient is currently admitted to the hospital and receiving intravenous antibiotics. The antibiotics were administered within an appropriate time frame prior to the initiation of the procedure. ANESTHESIA/SEDATION: Moderate (conscious) sedation was employed during this procedure. A total of Versed 2 mg and Fentanyl 50 mcg was administered intravenously. Moderate Sedation Time: 14 minutes. The patient's level of consciousness and vital signs were monitored continuously by radiology nursing throughout the procedure under my direct supervision. CONTRAST:  None COMPLICATIONS: None immediate. PROCEDURE:  Informed written consent was obtained from the patient after a discussion of the risks, benefits and alternatives to treatment. The patient was placed prone on the CT gantry and a pre procedural CT was performed re-demonstrating the known abscess/fluid collections within the bilateral iliopsoas musculature with dominant right-sided collection measuring at least 3.5 x 3.0 cm and dominant left-sided component measuring at least 2.5 x 1.7 cm (image 37, series 2). The procedure was planned. A timeout was performed prior to the initiation of the procedure. The skin overlying the operative site was prepped and draped in the usual sterile fashion. The overlying soft tissues were anesthetized with 1% lidocaine with epinephrine. Appropriate trajectory was planned  with the use of 22 gauge spinal needles 18 gauge trocar needles were advanced into both retroperitoneal abscesses/fluid collections. Small amount of purulent material was aspirated from both collections in short Amplatz wires were coiled within each collection. Appropriate positioning was confirmed with a limited CT scan. The tract was serially dilated allowing placement of a 10 French all-purpose drainage catheter bilaterally. Appropriate positioning was confirmed with a limited postprocedural CT scan. Approximately 35 cc of purulent fluid was aspirated from the right-sided iliopsoas abscess while approximately 5 cc of bloody fluid was aspirated from the left-sided iliopsoas abscess. Aspirated fluid from the right-sided collection was capped and sent to the laboratory for analysis. Both drainage catheters were connected to JP bulbs and sutured in place. Dressings were applied. The patient tolerated the procedure well without immediate post procedural complication. IMPRESSION: 1. Successful CT guided placement of a 10 [place] all purpose drain catheter into the dominant right-sided retroperitoneal abscess with aspiration of 35 mL of purulent fluid. Samples were sent  to the laboratory as requested by the ordering clinical team. 2. Successful CT-guided placement of a 10 French percutaneous drainage catheter into dominant left-sided retroperitoneal abscess with aspiration of approximately 5 cc of bloody fluid. Electronically Signed   By: Simonne Come M.D.   On: 02/16/2021 07:52   CT IMAGE GUIDED DRAINAGE BY PERCUTANEOUS CATHETER  Result Date: 02/16/2021 INDICATION: History of disseminated MSSA infection, now with bilateral paraspinal abscesses. Please perform CT-guided aspiration and/or drainage catheter placement for infection source control purposes. EXAM: CT GUIDED RETROPERITONEAL ABSCESS DRAINAGE CATHETER PLACEMENT X2 COMPARISON:  Normal lumbar spine MRI-02/15/2021 MEDICATIONS: The patient is currently admitted to the hospital and receiving intravenous antibiotics. The antibiotics were administered within an appropriate time frame prior to the initiation of the procedure. ANESTHESIA/SEDATION: Moderate (conscious) sedation was employed during this procedure. A total of Versed 2 mg and Fentanyl 50 mcg was administered intravenously. Moderate Sedation Time: 14 minutes. The patient's level of consciousness and vital signs were monitored continuously by radiology nursing throughout the procedure under my direct supervision. CONTRAST:  None COMPLICATIONS: None immediate. PROCEDURE: Informed written consent was obtained from the patient after a discussion of the risks, benefits and alternatives to treatment. The patient was placed prone on the CT gantry and a pre procedural CT was performed re-demonstrating the known abscess/fluid collections within the bilateral iliopsoas musculature with dominant right-sided collection measuring at least 3.5 x 3.0 cm and dominant left-sided component measuring at least 2.5 x 1.7 cm (image 37, series 2). The procedure was planned. A timeout was performed prior to the initiation of the procedure. The skin overlying the operative site was prepped  and draped in the usual sterile fashion. The overlying soft tissues were anesthetized with 1% lidocaine with epinephrine. Appropriate trajectory was planned with the use of 22 gauge spinal needles 18 gauge trocar needles were advanced into both retroperitoneal abscesses/fluid collections. Small amount of purulent material was aspirated from both collections in short Amplatz wires were coiled within each collection. Appropriate positioning was confirmed with a limited CT scan. The tract was serially dilated allowing placement of a 10 French all-purpose drainage catheter bilaterally. Appropriate positioning was confirmed with a limited postprocedural CT scan. Approximately 35 cc of purulent fluid was aspirated from the right-sided iliopsoas abscess while approximately 5 cc of bloody fluid was aspirated from the left-sided iliopsoas abscess. Aspirated fluid from the right-sided collection was capped and sent to the laboratory for analysis. Both drainage catheters were connected to JP bulbs and sutured in place.  Dressings were applied. The patient tolerated the procedure well without immediate post procedural complication. IMPRESSION: 1. Successful CT guided placement of a 10 [place] all purpose drain catheter into the dominant right-sided retroperitoneal abscess with aspiration of 35 mL of purulent fluid. Samples were sent to the laboratory as requested by the ordering clinical team. 2. Successful CT-guided placement of a 10 French percutaneous drainage catheter into dominant left-sided retroperitoneal abscess with aspiration of approximately 5 cc of bloody fluid. Electronically Signed   By: Simonne Come M.D.   On: 02/16/2021 07:52   [place] IMAGE GUIDED FLUID DRAIN BY CATHETER  Result Date: 02/15/2021 INDICATION: History of multifocal MSSA infection with indeterminate fluid collection with the left supraclavicular fossa. Please perform ultrasound-guided aspiration and/or drainage catheter placement as indicated. EXAM:  ULTRASOUND-GUIDED DRAINAGE CATHETER PLACEMENT INTO ABSCESS WITHIN THE LEFT SUPRACLAVICULAR FOSSA COMPARISON:  Chest CT-02/13/2021; 02/09/2021; 02/01/2021 MEDICATIONS: The patient is currently admitted to the hospital and receiving intravenous antibiotics. The antibiotics were administered within an appropriate time frame prior to the initiation of the procedure. ANESTHESIA/SEDATION: Moderate (conscious) sedation was employed during this procedure. A total of Versed 1.5 mg and Fentanyl 50 mcg was administered intravenously. Moderate Sedation Time: 14 minutes. The patient's level of consciousness and vital signs were monitored continuously by radiology nursing throughout the procedure under my direct supervision. CONTRAST:  None COMPLICATIONS: None immediate. PROCEDURE: Informed written consent was obtained from the patient after a discussion of the risks, benefits and alternatives to treatment. Patient was positioned slightly upper right on his hospital bed and sonographic evaluation was performed of the left supraclavicular fossa demonstrating a an indeterminate fluid collection compatible with the findings seen on preceding chest CT. The procedure was planned. A timeout was performed prior to the initiation of the procedure. The left supraclavicular fossa was prepped and draped in the usual sterile fashion. The overlying soft tissues were anesthetized with 1% lidocaine with epinephrine. Appropriate trajectory was planned with the use of a 22 gauge spinal needle. An 18 gauge trocar needle was advanced into the abscess/fluid collection and a short Amplatz super stiff wire was coiled within the collection. Appropriate positioning was confirmed with a limited CT scan. The tract was serially dilated allowing placement of a 10 [place] all-purpose drainage catheter. Appropriate positioning was confirmed with a limited postprocedural CT scan. Next, approximately 40 cc of bloody fluid was aspirated. The tube was connected to  a JP bulb and sutured in place. A dressing was applied. The patient tolerated the procedure well without immediate post procedural complication. IMPRESSION: Successful ultrasound guided placement of a 10 French all purpose drain catheter into the left supraclavicular abscess with aspiration of 40 mL of bloody fluid. Samples were sent to the laboratory as requested by the ordering clinical team. Electronically Signed   By: Simonne Come M.D.   On: 02/15/2021 17:01    Anti-infectives: Anti-infectives (From admission, onward)    Start     Dose/Rate Route Frequency Ordered Stop   02/10/21 0727  ceFAZolin (ANCEF) 2-4 GM/100ML-% IVPB       Note to Pharmacy: Lyda Kalata   : cabinet override      02/10/21 0727 02/10/21 1856   02/01/21 0600  ceFAZolin (ANCEF) IVPB 2g/100 mL premix        2 g 200 mL/hr over 30 Minutes Intravenous Every 8 hours 02/01/21 0414     01/31/21 1800  cefTRIAXone (ROCEPHIN) 1 g in sodium chloride 0.9 % 100 mL IVPB  Status:  Discontinued  1 g 200 mL/hr over 30 Minutes Intravenous Every 24 hours 01/31/21 1453 02/01/21 0425   01/31/21 1115  vancomycin (VANCOREADY) IVPB 2000 mg/400 mL        2,000 mg 200 mL/hr over 120 Minutes Intravenous  Once 01/31/21 1105 01/31/21 1320   01/31/21 1045  ceFEPIme (MAXIPIME) 2 g in sodium chloride 0.9 % 100 mL IVPB        2 g 200 mL/hr over 30 Minutes Intravenous  Once 01/31/21 1035 01/31/21 1129   01/31/21 1045  metroNIDAZOLE (FLAGYL) IVPB 500 mg        500 mg 100 mL/hr over 60 Minutes Intravenous  Once 01/31/21 1035 01/31/21 1208   01/31/21 1045  vancomycin (VANCOCIN) IVPB 1000 mg/200 mL premix  Status:  Discontinued        1,000 mg 200 mL/hr over 60 Minutes Intravenous  Once 01/31/21 1035 01/31/21 1105       Assessment/Plan  s/p Procedure(s):  Complex left lateral chest wall and axillary abscess POD6 S/p Incision and drainage of multiple left lateral chest wall abscesses along with excisional debridement of left lateral chest  wall skin, soft tissue, muscle, and fascia and axilla 6/18 Dr. Andrey CampanileWilson - Cx with abundant staphylococcus aureus - ID following and managing antibiotics - CT chest 6/21 with decrease in the volume of gas and debris in the chest wall. CT shoulder on L show a persistent, extensive abscess along the left chest wall/axilla, with visible gas/fluid extending superiorly in the neck to the level of C6/C7, and posteriorly to the paraspinal musculature. Axilla fluid collection measures 4.0 x 1.9 x 7.5cm. Ortho recommended IR aspiration. Agree with this plan - We do not recommend any further debridement of chest wall at this time - assess vac today, does not appear to be functioning today - Noted MRI results with osteo w/ T3-6, T10-11 acute osteomyelitis, L1-L3 osteomyelitis discitis, and ? Septic arthitis right T3-4 and T4-5 facets. Will defer management to NSGY. There was also bilateral psoas abscess R>L (3.7cm). Will discuss with MD about this but would defer to ortho/IR recommendations for now. - This is a very complex case. I discussed in person with TRH to ensure we are on the same page for the patients care plan. - We will follow with you    Bilateral shoulder abscess, s/p debridement 6/16 and 6/18 - Per Dr. Dion SaucierLandau - As above  Acute blood loss anemia - Hgb 7 from 7.7 yesterday, likely from wound and multiple debridements   ID - currently ancef 6/9>> Tmax 102.9. WBC down from 15.5 > 12.1 >10.7 > 10.5. 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 Septic arthitis right T3-4 and T4-5 facets on MRI Bilateral psoas abscess, R>L (3.7cm) HTN DM Chronic back pain   LOS: 16 days   Rodman PickleLuke Aaron Daphnee Preiss , [facility] 02/16/2021, 8:52 AM Please see Amion for pager number during day hours 7:00am-4:30pm or 7:00am -11:30am on weekends

## 2021-02-16 NOTE — Progress Notes (Signed)
PT Cancellation Note  Patient Details Name: John Ware MRN: [id number] DOB: 1966/04/12   Cancelled Treatment:    Reason Eval/Treat Not Completed: Pain limiting ability to participate Pt absolutely declined to get OOB today.  He reports his pain is not being managed., however RN reports he is not due for any pain meds at this time.  Pt likely to have PCA later today.  Will check back as schedule permits.   Joanne Salah,KATHrine E 02/16/2021, 11:38 AM Thomasene Mohair PT, DPT Acute Rehabilitation Services Pager: [phone] Office: [phone]

## 2021-02-16 NOTE — Progress Notes (Signed)
ID Brief Note   Patient had successful US guided left supraclavicular and rt/ left paraspinal drain . Plan for repeat imaging Monday and possible repeat shoulder wash out on Tuesday noted   6/23 cultures abscess cultures [facility] in pairs.  Dr Renold Don will follow cultures over the weekend. Otherwise, I will follow up on Monday.  Odette Fraction, MD Infectious Disease Physician [facility] [address] [place], [place] [address] Phone: [phone]  Fax: [phone]

## 2021-02-16 NOTE — Progress Notes (Signed)
Referring Physician(s): Janine OresBrown, Blaine PA-C  Supervising Physician: Irish LackYamagata, Glenn  [facility]  [facility]  Chief Complaint:  Left neck/supraclavicular fluid collection and bilateral psoas muscle fluid collections concerning for abscesses; s/p drain placement x 3 with Dr. Grace IsaacWatts on 6/23   Subjective:  Patient laying in bed sleeping, family member at the bedside.  States that he is hurting from wound vac change.  No complaints regarding the drains.   Allergies: Tizanidine  Medications: Prior to Admission medications   Medication Sig Start Date End Date Taking? Authorizing Provider  acetaminophen (TYLENOL) 325 MG tablet Take 650-975 mg by mouth every 6 (six) hours as needed for mild pain, fever or headache.   Yes [provider]  amLODipine (NORVASC) 5 MG tablet Take 1 tablet (5 mg total) by mouth daily. 06/23/20  Yes Hawks, Christy A, FNP  buPROPion (WELLBUTRIN XL) 150 MG 24 hr tablet TAKE (1) TABLET TWICE A DAY. Patient taking differently: Take 150 mg by mouth 2 (two) times daily. 01/19/21  Yes Hawks, Christy A, FNP  busPIRone (BUSPAR) 5 MG tablet TAKE 1 TABLET 3 TIMES A DAY Patient taking differently: Take 5 mg by mouth 3 (three) times daily. 11/17/20  Yes Hawks, Christy A, FNP  gabapentin (NEURONTIN) 800 MG tablet TAKE (1) TABLET THREE TIMES DAILY. Patient taking differently: Take 800 mg by mouth 3 (three) times daily. 01/19/21  Yes Jannifer RodneyHawks, Christy A, FNP  metFORMIN (GLUCOPHAGE XR) 750 MG 24 hr tablet Take 2 tablets (1,500 mg total) by mouth daily with breakfast. 06/23/20 12/20/20 Yes Hawks, Christy A, FNP  methocarbamol (ROBAXIN) 500 MG tablet Take 1 tablet (500 mg total) by mouth 2 (two) times daily as needed for muscle spasms. 01/20/21  Yes Caccavale, Sophia, PA-C  oxyCODONE ER (XTAMPZA ER) 18 MG C12A Take 1 tablet by mouth every 12 (twelve) hours. 11/26/19  Yes [provider]  oxyCODONE-acetaminophen (PERCOCET) 10-325 MG tablet Take 1 tablet by mouth 3  (three) times daily as needed for pain. 01/25/21  Yes [provider]  ramipril (ALTACE) 10 MG capsule TAKE (1) CAPSULE DAILY Patient taking differently: Take 10 mg by mouth daily. 06/23/20  Yes Hawks, Christy A, FNP  sertraline (ZOLOFT) 100 MG tablet Take 1 tablet (100 mg total) by mouth daily. 06/23/20  Yes Hawks, Christy A, FNP  Vitamin D, Ergocalciferol, (DRISDOL) 1.25 MG (50000 UNIT) CAPS capsule Take 50,000 Units by mouth once a week. 01/25/21  Yes [provider]  diclofenac Sodium (VOLTAREN) 1 % GEL Apply 2 g topically 4 (four) times daily. Patient not taking: Reported on 01/31/2021 01/20/21   Caccavale, Sophia, PA-C  empagliflozin (JARDIANCE) 10 MG TABS tablet Take 1 tablet (10 mg total) by mouth daily before breakfast. Patient not taking: Reported on 01/31/2021 10/10/20   Junie SpencerHawks, Christy A, FNP  atorvastatin (LIPITOR) 20 MG tablet Take 1 tablet (20 mg total) by mouth daily. Patient not taking: Reported on 06/23/2020 02/29/20 08/23/20  Junie SpencerHawks, Christy A, FNP     Vital Signs: BP 127/80 (BP Location: Right Arm)   Pulse 91   Temp 99.4 F (37.4 C) (Oral)   Resp 20   Ht 5\' 10"  (1.778 m)   Wt 214 lb 8.1 oz (97.3 kg)   SpO2 100%   BMI 30.78 kg/m   Physical Exam Vitals reviewed.  Constitutional:      General: He is not in acute distress. HENT:     Head: Normocephalic and atraumatic.  Cardiovascular:     Rate and Rhythm:  Normal rate.  Pulmonary:     Effort: Pulmonary effort is normal.  Abdominal:     General: Abdomen is flat.     Palpations: Abdomen is soft.  Skin:    General: Skin is warm and dry.     Coloration: Skin is not jaundiced or pale.     Comments: Positive left neck drain to a suction bulb. Site is unremarkable with no erythema, edema, tenderness, bleeding or drainage. Suture and stat lock in place. Dressing is clean, dry, and intact. 5 ml of  serosanguinous  fluid noted in the suction bulb. Drain aspirates and flushes well.   Positive left back drain to a  suction bulb. Site is unremarkable with no erythema, edema, tenderness, bleeding or drainage. Suture and stat lock in place. Dressing is clean, dry, and intact. 10 ml of  bloody fluid noted in the suction bulb. Drain aspirates and flushes well.   Positive right back drain to a suction bulb. Site is unremarkable with no erythema, edema, tenderness, bleeding or drainage. Suture and stat lock in place. Dressing is clean, dry, and intact. 5 ml of  purulent fluid noted in the suction bulb. Drain aspirates and flushes well.      Neurological:     Mental Status: He is alert and oriented to person, place, and time.  Psychiatric:        Mood and Affect: Mood normal.        Behavior: Behavior normal.    Imaging: CT CHEST WO CONTRAST  Result Date: 02/13/2021 CLINICAL DATA:  History of shoulder infection. EXAM: CT CHEST WITHOUT CONTRAST TECHNIQUE: Multidetector CT imaging of the chest was performed following the standard protocol without IV contrast. COMPARISON:  CT chest 02/09/2021 FINDINGS: Cardiovascular: The heart size is normal. No substantial pericardial effusion. No thoracic aortic aneurysm. Mediastinum/Nodes: No mediastinal lymphadenopathy. No evidence for gross hilar lymphadenopathy although assessment is limited by the lack of intravenous contrast on today's study. The esophagus has normal imaging features. Bulky lymphadenopathy is again identified in the left axilla. Lungs/Pleura: 7 mm subpleural left upper lobe pulmonary nodule on 71/7 is similar to prior. 6 mm nodule in the medial left upper lobe (91/7) and 12 mm nodule in the posterior left upper lobe are similar to prior. Bibasilar collapse/consolidation is not substantially changed in the interval. Probable tiny bilateral effusions. Upper Abdomen: Unremarkable. Musculoskeletal: Soft tissue gas and fluid is identified in the left supraclavicular region, around the left shoulder, and in the lateral and posterior left chest wall. Fluid and gas in  the supraclavicular region tracking posteriorly into the subscapular space is similar to prior. Diffuse edema noted in the muscles of the rotator cuff. Surgical drain noted posterolateral lower left thoracic wall with interval decrease in soft tissue gas and swelling in this region. There is an open wound in the region of the cervical drain. Gas and fluid are seen in the region of the right shoulder joint, with more gas visible on today's study than on the previous exam. Diffuse edema noted in the muscles of the rotator cuff. IMPRESSION: 1. Interval drain placement in the lateral and posterolateral left chest wall with decrease in the volume of gas and debris in the chest wall. 2. Similar gas in fluid in the left supraclavicular space and region of the left shoulder joint with diffuse edema of the rotator cuff muscles. 3. Gas is now visible in the soft tissues anterior to the right shoulder with similar appearance of fluid and edema in the soft  tissues and muscles around the right shoulder. Electronically Signed   By: Kennith Center M.D.   On: 02/13/2021 17:37   MR CERVICAL SPINE W WO CONTRAST  Result Date: 02/15/2021 CLINICAL DATA:  55 year old male with history of disseminated MSSA infection, evaluate for osteomyelitis discitis. EXAM: MRI CERVICAL, THORACIC AND LUMBAR SPINE WITHOUT AND WITH CONTRAST TECHNIQUE: Multiplanar and multiecho pulse sequences of the cervical spine, to include the craniocervical junction and cervicothoracic junction, and thoracic and lumbar spine, were obtained without and with intravenous contrast. CONTRAST:  10mL GADAVIST GADOBUTROL 1 MMOL/ML IV SOLN COMPARISON:  Prior CT from 02/13/2021. FINDINGS: MRI CERVICAL SPINE FINDINGS Alignment: Examination somewhat technically limited as the patient was unable to complete the entirety of the exam. No postcontrast images of the cervical spine are provided. Mild straightening of the normal cervical lordosis.  No listhesis. Vertebrae: Vertebral  body height maintained without acute or chronic fracture. Diffusely decreased T1 signal intensity seen throughout the visualized bone marrow, nonspecific, but most commonly related to anemia, smoking, or obesity. No discrete or worrisome osseous lesions. No findings to suggest osteomyelitis discitis or septic arthritis within the cervical spine. Cord: Normal signal and morphology. No epidural abscess or other collection. Posterior Fossa, vertebral arteries, paraspinal tissues: Partially empty sella noted. Visualized brain and posterior fossa otherwise unremarkable. Craniocervical junction normal. Known fluid collections/abscess involving the left chest wall are partially visualized. Largest loculated component seen at the left upper posterior chest and measures 5.3 x 1.6 cm (series 34, image 34). Additional separate and partially visualized loculated component position slightly laterally measures 4.3 x 1.6 cm (series 34, image 3). Scattered foci of internal susceptibility artifact consistent with gas. No extension to involve the underlying spinal canal or vertebral column. Associated scattered ill-defined edema noted within the musculature of the visualized upper posterior chest. Prevertebral soft tissues are within normal limits. Normal flow voids seen within the vertebral arteries bilaterally. Disc levels: C2-C3: Mild uncovertebral hypertrophy without significant disc bulge. Right worse than left facet hypertrophy. No spinal stenosis. Mild to moderate right C3 foraminal narrowing. Left neural foramina remains patent. C3-C4: Left paracentral disc protrusion with slight inferior migration indents the left ventral thecal sac (series 33, image 10). Mild flattening of the left ventral cord without cord signal changes. Mild spinal stenosis. Superimposed uncovertebral and facet hypertrophy with resultant mild to moderate right worse than left C4 foraminal stenosis. C4-C5: Disc desiccation. Broad-based central disc  protrusion indents the ventral thecal sac, eccentric to the left (series 33, image 15). Superimposed uncovertebral and facet hypertrophy. Resultant mild to moderate spinal stenosis. Severe right worse than left C5 foraminal narrowing. C5-C6: Small right paracentral disc protrusion indents the ventral thecal sac (series 34, image 25). Mild spinal stenosis without cord deformity. Superimposed bilateral facet hypertrophy. Mild to moderate left worse than right C6 foraminal narrowing. C6-C7: Disc desiccation with diffuse disc bulge. Superimposed central disc extrusion with inferior migration. Extruded disc material contacts and mildly flattens the ventral cord. No cord signal changes. Moderate spinal stenosis. Superimposed facet and uncovertebral hypertrophy with resultant severe right worse than left C7 foraminal stenosis. C7-T1: Small amount of extruded disc material extends from the C6-7 level above. No other disc pathology at this level. Right worse than left facet hypertrophy. No significant spinal stenosis. Mild right C8 foraminal narrowing. Left neural foramina remains patent. MRI THORACIC SPINE FINDINGS Alignment:  Examination technically limited by motion artifact. Normal alignment with preservation of the normal thoracic kyphosis. No listhesis. Vertebrae: Vertebral body height maintained without acute or  chronic fracture. Bone marrow signal intensity diffusely decreased on T1 weighted imaging. 1.5 cm benign hemangioma noted within the T7 vertebral body. No worrisome osseous lesions. Abnormal marrow edema and enhancement seen involving the T3, T4, T5, and T6 vertebral bodies, suspicious for acute osteomyelitis. Probable involvement of the anterior aspects of the T10 and T11 vertebral bodies as well (series 37, image 4) no convincing evidence for intervening discitis at this time. Mild marrow edema seen about the right T3-4 and T4-5 facets, suspected to reflect concomitant septic arthritis (series 37, image 2).  Cord: No convincing cord signal abnormality seen on this motion degraded exam. Thin enhancement seen involving the ventral epidural space at T3 through T6, likely related to mild infection/phlegmon (series 37, image 6). Possible scattered dorsal involvement noted as well. No frank epidural abscess. Paraspinal and other soft tissues: Known left chest wall collections/abscesses are partially visualized, with largest component seen at the upper left posterior chest measuring 11.8 x 1.9 cm (series 38, image 4). Mild paraspinous phlegmon/enhancement seen adjacent to the infected vertebral bodies, most pronounced at T3-4 and T4-5 (series 39, image 10, 15) no frank paraspinous abscess or collection. Mildly irregular layering bilateral pleural effusions, left greater than right partially visualized. Atelectasis and/or infiltrates noted within the partially visualized lungs. Disc levels: T1-2: Negative interspace. Bilateral facet hypertrophy. No stenosis. T2-3: Facet hypertrophy. Probable right foraminal disc protrusion. No significant spinal stenosis. Mild bilateral foraminal narrowing. T3-4: Negative interspace. Bilateral facet hypertrophy with possible septic arthritis on the right. No significant stenosis. T4-5: Central/right paracentral disc protrusion indents the ventral thecal sac. Flattening of the right ventral cord. Posterior element hypertrophy. Mild spinal stenosis. Foramina remain patent. T5-6: Right paracentral disc protrusion indents the right ventral thecal sac, contacting and flattening the right cord. Moderate spinal stenosis. Foramina remain patent. T6-7: Central/left paracentral disc extrusion with superior migration contacts and flattens the ventral cord. Posterior element hypertrophy with prominence of the epidural fat. Mild to moderate spinal stenosis. Foramina remain patent. T7-8: Shallow left paracentral disc protrusion. Prominence of the dorsal epidural fat with mild posterior element hypertrophy.  Mild spinal stenosis. Foramina remain patent. T8-9: Small central irregular disc protrusion. Mild posterior element hypertrophy. Mild spinal stenosis. Foramina remain patent. T9-10: Left paracentral to foraminal disc protrusion. Posterior element hypertrophy. Mild spinal stenosis. Mild left neural foraminal narrowing. T10-11: Right paracentral disc protrusion with superior migration. Moderate facet hypertrophy. Mild flattening of the ventral cord with mild-to-moderate spinal stenosis. Moderate bilateral foraminal narrowing. T11-12: Right paracentral disc extrusion with superior migration. Secondary flattening of the right ventral cord. Moderate facet hypertrophy. Resultant moderate canal with bilateral foraminal stenosis. T12-L1: Right paracentral disc protrusion. Moderate facet hypertrophy. No significant stenosis. MRI LUMBAR SPINE FINDINGS Segmentation:  Standard. Alignment: 7 mm anterolisthesis of L5 on S1. Trace retrolisthesis of L1 on L2 and L2 on L3. Vertebrae: Sequelae of prior PLIF at L3 through S1. Vertebral body height maintained without acute or chronic fracture. Diffusely decreased T1 signal intensity within the underlying bone marrow. There is abnormal edema involving the L2-3 disc, with marrow edema involving the adjacent L2 and L3 vertebral bodies, consistent with osteomyelitis discitis. Suspected additional involvement of the adjacent L1-2 disc as well. Conus medullaris and cauda equina: Conus extends to the L1 level. Conus and cauda equina appear normal. No visible significant epidural involvement or epidural abscess. Paraspinal and other soft tissues: Abnormal edema with multifocal abscesses seen involving the psoas muscles bilaterally. Largest of these seen on the right and measures 3.7 x 2.8 cm (series  28, image 30). Additional collection at the laminectomy site within the posterior soft tissues measures 2.9 x 2.1 x 5.9 cm, felt to be most consistent with a benign postoperative seroma. Additional  chronic postoperative changes noted elsewhere within the posterior paraspinous soft tissues. No visible SI joint involvement. Disc levels: L1-2: Retrolisthesis. Intervertebral disc space narrowing with mild disc bulge. Moderate facet hypertrophy. No significant spinal stenosis. Mild bilateral L1 foraminal narrowing. L2-3: Trace retrolisthesis. Mild disc bulge. Advanced facet and ligament flavum hypertrophy. Resultant moderate spinal stenosis. Moderate bilateral L2 foraminal narrowing. L3-4:  Prior PLIF.  No residual stenosis. L4-5:  Prior PLIF.  No residual stenosis. L5-S1: Chronic 7 mm anterolisthesis with prior PLIF. No significant residual spinal stenosis. Probable residual mild to moderate foraminal narrowing largely due to slippage. IMPRESSION: MRI CERVICAL SPINE IMPRESSION: 1. No evidence for osteomyelitis discitis within the cervical spine. No epidural abscess or other abnormality. 2. Partially visualized collections/abscesses involving the left upper chest wall, better seen on recent CT examinations. No extension to involve the underlying spinal canal or vertebral column. 3. Multilevel degenerative spondylosis with resultant mild to moderate spinal stenosis at C3-4 through C6-7 as above. Severe right worse than left C5 and C7 foraminal stenosis. MRI THORACIC SPINE IMPRESSION: 1. Findings consistent with acute osteomyelitis involving the T3, T4, T5, and T6 vertebral bodies, with probable involvement of the anterior aspect of the T10 and T11 vertebral bodies. No convincing evidence for intervening discitis at this time. 2. Mild marrow edema and enhancement involving the right T3-4 and T4-5 facets, consistent with concomitant septic arthritis. 3. Enhancement involving the ventral epidural space at T3 through T6, consistent with mild/early epidural involvement. No frank epidural abscess. 4. Partially visualized left chest wall abscesses/collections with bilateral pleural effusions. 5. Multilevel degenerative  spondylosis with resultant mild to moderate diffuse spinal stenosis throughout the thoracic spine as detailed above. MRI LUMBAR SPINE IMPRESSION: 1. 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 No appreciable epidural involvement at this time. 2. Multifocal abscesses involving the bilateral psoas musculature, largest of which on the right measures up to 3.7 cm. 3. Postoperative changes from prior PLIF at L3-4 through L5-S1 without residual spinal stenosis. Collection at the posterior laminectomy site felt to be most consistent with a benign postoperative seroma. 4. 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 Electronically Signed   By: Rise Mu M.D.   On: 02/15/2021 03:11   MR THORACIC SPINE W WO CONTRAST  Result Date: 02/15/2021 CLINICAL DATA:  55 year old male with history of disseminated MSSA infection, evaluate for osteomyelitis discitis. EXAM: MRI CERVICAL, THORACIC AND LUMBAR SPINE WITHOUT AND WITH CONTRAST TECHNIQUE: Multiplanar and multiecho pulse sequences of the cervical spine, to include the craniocervical junction and cervicothoracic junction, and thoracic and lumbar spine, were obtained without and with intravenous contrast. CONTRAST:  10mL GADAVIST GADOBUTROL 1 MMOL/ML IV SOLN COMPARISON:  Prior CT from 02/13/2021. FINDINGS: MRI CERVICAL SPINE FINDINGS Alignment: Examination somewhat technically limited as the patient was unable to complete the entirety of the exam. No postcontrast images of the cervical spine are provided. Mild straightening of the normal cervical lordosis.  No listhesis. Vertebrae: Vertebral body height maintained without acute or chronic fracture. Diffusely decreased T1 signal intensity seen throughout the visualized bone marrow, nonspecific, but most commonly related to anemia, smoking, or obesity. No discrete or worrisome osseous lesions. No findings to suggest osteomyelitis  discitis or septic arthritis within the cervical spine. Cord: Normal signal and  morphology. No epidural abscess or other collection. Posterior Fossa, vertebral arteries, paraspinal tissues: Partially empty sella noted. Visualized brain and posterior fossa otherwise unremarkable. Craniocervical junction normal. Known fluid collections/abscess involving the left chest wall are partially visualized. Largest loculated component seen at the left upper posterior chest and measures 5.3 x 1.6 cm (series 34, image 34). Additional separate and partially visualized loculated component position slightly laterally measures 4.3 x 1.6 cm (series 34, image 3). Scattered foci of internal susceptibility artifact consistent with gas. No extension to involve the underlying spinal canal or vertebral column. Associated scattered ill-defined edema noted within the musculature of the visualized upper posterior chest. Prevertebral soft tissues are within normal limits. Normal flow voids seen within the vertebral arteries bilaterally. Disc levels: C2-C3: Mild uncovertebral hypertrophy without significant disc bulge. Right worse than left facet hypertrophy. No spinal stenosis. Mild to moderate right C3 foraminal narrowing. Left neural foramina remains patent. C3-C4: Left paracentral disc protrusion with slight inferior migration indents the left ventral thecal sac (series 33, image 10). Mild flattening of the left ventral cord without cord signal changes. Mild spinal stenosis. Superimposed uncovertebral and facet hypertrophy with resultant mild to moderate right worse than left C4 foraminal stenosis. C4-C5: Disc desiccation. Broad-based central disc protrusion indents the ventral thecal sac, eccentric to the left (series 33, image 15). Superimposed uncovertebral and facet hypertrophy. Resultant mild to moderate spinal stenosis. Severe right worse than left C5 foraminal narrowing. C5-C6: Small right paracentral disc protrusion indents the  ventral thecal sac (series 34, image 25). Mild spinal stenosis without cord deformity. Superimposed bilateral facet hypertrophy. Mild to moderate left worse than right C6 foraminal narrowing. C6-C7: Disc desiccation with diffuse disc bulge. Superimposed central disc extrusion with inferior migration. Extruded disc material contacts and mildly flattens the ventral cord. No cord signal changes. Moderate spinal stenosis. Superimposed facet and uncovertebral hypertrophy with resultant severe right worse than left C7 foraminal stenosis. C7-T1: Small amount of extruded disc material extends from the C6-7 level above. No other disc pathology at this level. Right worse than left facet hypertrophy. No significant spinal stenosis. Mild right C8 foraminal narrowing. Left neural foramina remains patent. MRI THORACIC SPINE FINDINGS Alignment:  Examination technically limited by motion artifact. Normal alignment with preservation of the normal thoracic kyphosis. No listhesis. Vertebrae: Vertebral body height maintained without acute or chronic fracture. Bone marrow signal intensity diffusely decreased on T1 weighted imaging. 1.5 cm benign hemangioma noted within the T7 vertebral body. No worrisome osseous lesions. Abnormal marrow edema and enhancement seen involving the T3, T4, T5, and T6 vertebral bodies, suspicious for acute osteomyelitis. Probable involvement of the anterior aspects of the T10 and T11 vertebral bodies as well (series 37, image 4) no convincing evidence for intervening discitis at this time. Mild marrow edema seen about the right T3-4 and T4-5 facets, suspected to reflect concomitant septic arthritis (series 37, image 2). Cord: No convincing cord signal abnormality seen on this motion degraded exam. Thin enhancement seen involving the ventral epidural space at T3 through T6, likely related to mild infection/phlegmon (series 37, image 6). Possible scattered dorsal involvement noted as well. No frank epidural  abscess. Paraspinal and other soft tissues: Known left chest wall collections/abscesses are partially visualized, with largest component seen at the upper left posterior chest measuring 11.8 x 1.9 cm (series 38, image 4). Mild paraspinous phlegmon/enhancement seen adjacent to the infected vertebral bodies, most pronounced at T3-4 and T4-5 (series 39, image 10, 15) no frank paraspinous abscess or collection. Mildly irregular  layering bilateral pleural effusions, left greater than right partially visualized. Atelectasis and/or infiltrates noted within the partially visualized lungs. Disc levels: T1-2: Negative interspace. Bilateral facet hypertrophy. No stenosis. T2-3: Facet hypertrophy. Probable right foraminal disc protrusion. No significant spinal stenosis. Mild bilateral foraminal narrowing. T3-4: Negative interspace. Bilateral facet hypertrophy with possible septic arthritis on the right. No significant stenosis. T4-5: Central/right paracentral disc protrusion indents the ventral thecal sac. Flattening of the right ventral cord. Posterior element hypertrophy. Mild spinal stenosis. Foramina remain patent. T5-6: Right paracentral disc protrusion indents the right ventral thecal sac, contacting and flattening the right cord. Moderate spinal stenosis. Foramina remain patent. T6-7: Central/left paracentral disc extrusion with superior migration contacts and flattens the ventral cord. Posterior element hypertrophy with prominence of the epidural fat. Mild to moderate spinal stenosis. Foramina remain patent. T7-8: Shallow left paracentral disc protrusion. Prominence of the dorsal epidural fat with mild posterior element hypertrophy. Mild spinal stenosis. Foramina remain patent. T8-9: Small central irregular disc protrusion. Mild posterior element hypertrophy. Mild spinal stenosis. Foramina remain patent. T9-10: Left paracentral to foraminal disc protrusion. Posterior element hypertrophy. Mild spinal stenosis. Mild left  neural foraminal narrowing. T10-11: Right paracentral disc protrusion with superior migration. Moderate facet hypertrophy. Mild flattening of the ventral cord with mild-to-moderate spinal stenosis. Moderate bilateral foraminal narrowing. T11-12: Right paracentral disc extrusion with superior migration. Secondary flattening of the right ventral cord. Moderate facet hypertrophy. Resultant moderate canal with bilateral foraminal stenosis. T12-L1: Right paracentral disc protrusion. Moderate facet hypertrophy. No significant stenosis. MRI LUMBAR SPINE FINDINGS Segmentation:  Standard. Alignment: 7 mm anterolisthesis of L5 on S1. Trace retrolisthesis of L1 on L2 and L2 on L3. Vertebrae: Sequelae of prior PLIF at L3 through S1. Vertebral body height maintained without acute or chronic fracture. Diffusely decreased T1 signal intensity within the underlying bone marrow. There is abnormal edema involving the L2-3 disc, with marrow edema involving the adjacent L2 and L3 vertebral bodies, consistent with osteomyelitis discitis. Suspected additional involvement of the adjacent L1-2 disc as well. Conus medullaris and cauda equina: Conus extends to the L1 level. Conus and cauda equina appear normal. No visible significant epidural involvement or epidural abscess. Paraspinal and other soft tissues: Abnormal edema with multifocal abscesses seen involving the psoas muscles bilaterally. Largest of these seen on the right and measures 3.7 x 2.8 cm (series 28, image 30). Additional collection at the laminectomy site within the posterior soft tissues measures 2.9 x 2.1 x 5.9 cm, felt to be most consistent with a benign postoperative seroma. Additional chronic postoperative changes noted elsewhere within the posterior paraspinous soft tissues. No visible SI joint involvement. Disc levels: L1-2: Retrolisthesis. Intervertebral disc space narrowing with mild disc bulge. Moderate facet hypertrophy. No significant spinal stenosis. Mild  bilateral L1 foraminal narrowing. L2-3: Trace retrolisthesis. Mild disc bulge. Advanced facet and ligament flavum hypertrophy. Resultant moderate spinal stenosis. Moderate bilateral L2 foraminal narrowing. L3-4:  Prior PLIF.  No residual stenosis. L4-5:  Prior PLIF.  No residual stenosis. L5-S1: Chronic 7 mm anterolisthesis with prior PLIF. No significant residual spinal stenosis. Probable residual mild to moderate foraminal narrowing largely due to slippage. IMPRESSION: MRI CERVICAL SPINE IMPRESSION: 1. No evidence for osteomyelitis discitis within the cervical spine. No epidural abscess or other abnormality. 2. Partially visualized collections/abscesses involving the left upper chest wall, better seen on recent CT examinations. No extension to involve the underlying spinal canal or vertebral column. 3. Multilevel degenerative spondylosis with resultant mild to moderate spinal stenosis at C3-4 through C6-7 as above. Severe  right worse than left C5 and C7 foraminal stenosis. MRI THORACIC SPINE IMPRESSION: 1. Findings consistent with acute osteomyelitis involving the T3, T4, T5, and T6 vertebral bodies, with probable involvement of the anterior aspect of the T10 and T11 vertebral bodies. No convincing evidence for intervening discitis at this time. 2. Mild marrow edema and enhancement involving the right T3-4 and T4-5 facets, consistent with concomitant septic arthritis. 3. Enhancement involving the ventral epidural space at T3 through T6, consistent with mild/early epidural involvement. No frank epidural abscess. 4. Partially visualized left chest wall abscesses/collections with bilateral pleural effusions. 5. Multilevel degenerative spondylosis with resultant mild to moderate diffuse spinal stenosis throughout the thoracic spine as detailed above. MRI LUMBAR SPINE IMPRESSION: 1. 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 No appreciable epidural involvement at this time. 2. Multifocal  abscesses involving the bilateral psoas musculature, largest of which on the right measures up to 3.7 cm. 3. Postoperative changes from prior PLIF at L3-4 through L5-S1 without residual spinal stenosis. Collection at the posterior laminectomy site felt to be most consistent with a benign postoperative seroma. 4. 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 Electronically Signed   By: Rise Mu M.D.   On: 02/15/2021 03:11   MR Lumbar Spine W Wo Contrast  Result Date: 02/15/2021 CLINICAL DATA:  55 year old male with history of disseminated MSSA infection, evaluate for osteomyelitis discitis. EXAM: MRI CERVICAL, THORACIC AND LUMBAR SPINE WITHOUT AND WITH CONTRAST TECHNIQUE: Multiplanar and multiecho pulse sequences of the cervical spine, to include the craniocervical junction and cervicothoracic junction, and thoracic and lumbar spine, were obtained without and with intravenous contrast. CONTRAST:  10mL GADAVIST GADOBUTROL 1 MMOL/ML IV SOLN COMPARISON:  Prior CT from 02/13/2021. FINDINGS: MRI CERVICAL SPINE FINDINGS Alignment: Examination somewhat technically limited as the patient was unable to complete the entirety of the exam. No postcontrast images of the cervical spine are provided. Mild straightening of the normal cervical lordosis.  No listhesis. Vertebrae: Vertebral body height maintained without acute or chronic fracture. Diffusely decreased T1 signal intensity seen throughout the visualized bone marrow, nonspecific, but most commonly related to anemia, smoking, or obesity. No discrete or worrisome osseous lesions. No findings to suggest osteomyelitis discitis or septic arthritis within the cervical spine. Cord: Normal signal and morphology. No epidural abscess or other collection. Posterior Fossa, vertebral arteries, paraspinal tissues: Partially empty sella noted. Visualized brain and posterior fossa otherwise unremarkable. Craniocervical  junction normal. Known fluid collections/abscess involving the left chest wall are partially visualized. Largest loculated component seen at the left upper posterior chest and measures 5.3 x 1.6 cm (series 34, image 34). Additional separate and partially visualized loculated component position slightly laterally measures 4.3 x 1.6 cm (series 34, image 3). Scattered foci of internal susceptibility artifact consistent with gas. No extension to involve the underlying spinal canal or vertebral column. Associated scattered ill-defined edema noted within the musculature of the visualized upper posterior chest. Prevertebral soft tissues are within normal limits. Normal flow voids seen within the vertebral arteries bilaterally. Disc levels: C2-C3: Mild uncovertebral hypertrophy without significant disc bulge. Right worse than left facet hypertrophy. No spinal stenosis. Mild to moderate right C3 foraminal narrowing. Left neural foramina remains patent. C3-C4: Left paracentral disc protrusion with slight inferior migration indents the left ventral thecal sac (series 33, image 10). Mild flattening of the left ventral cord without cord signal changes. Mild spinal stenosis. Superimposed uncovertebral and facet hypertrophy with resultant mild to  moderate right worse than left C4 foraminal stenosis. C4-C5: Disc desiccation. Broad-based central disc protrusion indents the ventral thecal sac, eccentric to the left (series 33, image 15). Superimposed uncovertebral and facet hypertrophy. Resultant mild to moderate spinal stenosis. Severe right worse than left C5 foraminal narrowing. C5-C6: Small right paracentral disc protrusion indents the ventral thecal sac (series 34, image 25). Mild spinal stenosis without cord deformity. Superimposed bilateral facet hypertrophy. Mild to moderate left worse than right C6 foraminal narrowing. C6-C7: Disc desiccation with diffuse disc bulge. Superimposed central disc extrusion with inferior  migration. Extruded disc material contacts and mildly flattens the ventral cord. No cord signal changes. Moderate spinal stenosis. Superimposed facet and uncovertebral hypertrophy with resultant severe right worse than left C7 foraminal stenosis. C7-T1: Small amount of extruded disc material extends from the C6-7 level above. No other disc pathology at this level. Right worse than left facet hypertrophy. No significant spinal stenosis. Mild right C8 foraminal narrowing. Left neural foramina remains patent. MRI THORACIC SPINE FINDINGS Alignment:  Examination technically limited by motion artifact. Normal alignment with preservation of the normal thoracic kyphosis. No listhesis. Vertebrae: Vertebral body height maintained without acute or chronic fracture. Bone marrow signal intensity diffusely decreased on T1 weighted imaging. 1.5 cm benign hemangioma noted within the T7 vertebral body. No worrisome osseous lesions. Abnormal marrow edema and enhancement seen involving the T3, T4, T5, and T6 vertebral bodies, suspicious for acute osteomyelitis. Probable involvement of the anterior aspects of the T10 and T11 vertebral bodies as well (series 37, image 4) no convincing evidence for intervening discitis at this time. Mild marrow edema seen about the right T3-4 and T4-5 facets, suspected to reflect concomitant septic arthritis (series 37, image 2). Cord: No convincing cord signal abnormality seen on this motion degraded exam. Thin enhancement seen involving the ventral epidural space at T3 through T6, likely related to mild infection/phlegmon (series 37, image 6). Possible scattered dorsal involvement noted as well. No frank epidural abscess. Paraspinal and other soft tissues: Known left chest wall collections/abscesses are partially visualized, with largest component seen at the upper left posterior chest measuring 11.8 x 1.9 cm (series 38, image 4). Mild paraspinous phlegmon/enhancement seen adjacent to the infected  vertebral bodies, most pronounced at T3-4 and T4-5 (series 39, image 10, 15) no frank paraspinous abscess or collection. Mildly irregular layering bilateral pleural effusions, left greater than right partially visualized. Atelectasis and/or infiltrates noted within the partially visualized lungs. Disc levels: T1-2: Negative interspace. Bilateral facet hypertrophy. No stenosis. T2-3: Facet hypertrophy. Probable right foraminal disc protrusion. No significant spinal stenosis. Mild bilateral foraminal narrowing. T3-4: Negative interspace. Bilateral facet hypertrophy with possible septic arthritis on the right. No significant stenosis. T4-5: Central/right paracentral disc protrusion indents the ventral thecal sac. Flattening of the right ventral cord. Posterior element hypertrophy. Mild spinal stenosis. Foramina remain patent. T5-6: Right paracentral disc protrusion indents the right ventral thecal sac, contacting and flattening the right cord. Moderate spinal stenosis. Foramina remain patent. T6-7: Central/left paracentral disc extrusion with superior migration contacts and flattens the ventral cord. Posterior element hypertrophy with prominence of the epidural fat. Mild to moderate spinal stenosis. Foramina remain patent. T7-8: Shallow left paracentral disc protrusion. Prominence of the dorsal epidural fat with mild posterior element hypertrophy. Mild spinal stenosis. Foramina remain patent. T8-9: Small central irregular disc protrusion. Mild posterior element hypertrophy. Mild spinal stenosis. Foramina remain patent. T9-10: Left paracentral to foraminal disc protrusion. Posterior element hypertrophy. Mild spinal stenosis. Mild left neural foraminal narrowing. T10-11: Right paracentral  disc protrusion with superior migration. Moderate facet hypertrophy. Mild flattening of the ventral cord with mild-to-moderate spinal stenosis. Moderate bilateral foraminal narrowing. T11-12: Right paracentral disc extrusion with  superior migration. Secondary flattening of the right ventral cord. Moderate facet hypertrophy. Resultant moderate canal with bilateral foraminal stenosis. T12-L1: Right paracentral disc protrusion. Moderate facet hypertrophy. No significant stenosis. MRI LUMBAR SPINE FINDINGS Segmentation:  Standard. Alignment: 7 mm anterolisthesis of L5 on S1. Trace retrolisthesis of L1 on L2 and L2 on L3. Vertebrae: Sequelae of prior PLIF at L3 through S1. Vertebral body height maintained without acute or chronic fracture. Diffusely decreased T1 signal intensity within the underlying bone marrow. There is abnormal edema involving the L2-3 disc, with marrow edema involving the adjacent L2 and L3 vertebral bodies, consistent with osteomyelitis discitis. Suspected additional involvement of the adjacent L1-2 disc as well. Conus medullaris and cauda equina: Conus extends to the L1 level. Conus and cauda equina appear normal. No visible significant epidural involvement or epidural abscess. Paraspinal and other soft tissues: Abnormal edema with multifocal abscesses seen involving the psoas muscles bilaterally. Largest of these seen on the right and measures 3.7 x 2.8 cm (series 28, image 30). Additional collection at the laminectomy site within the posterior soft tissues measures 2.9 x 2.1 x 5.9 cm, felt to be most consistent with a benign postoperative seroma. Additional chronic postoperative changes noted elsewhere within the posterior paraspinous soft tissues. No visible SI joint involvement. Disc levels: L1-2: Retrolisthesis. Intervertebral disc space narrowing with mild disc bulge. Moderate facet hypertrophy. No significant spinal stenosis. Mild bilateral L1 foraminal narrowing. L2-3: Trace retrolisthesis. Mild disc bulge. Advanced facet and ligament flavum hypertrophy. Resultant moderate spinal stenosis. Moderate bilateral L2 foraminal narrowing. L3-4:  Prior PLIF.  No residual stenosis. L4-5:  Prior PLIF.  No residual stenosis.  L5-S1: Chronic 7 mm anterolisthesis with prior PLIF. No significant residual spinal stenosis. Probable residual mild to moderate foraminal narrowing largely due to slippage. IMPRESSION: MRI CERVICAL SPINE IMPRESSION: 1. No evidence for osteomyelitis discitis within the cervical spine. No epidural abscess or other abnormality. 2. Partially visualized collections/abscesses involving the left upper chest wall, better seen on recent CT examinations. No extension to involve the underlying spinal canal or vertebral column. 3. Multilevel degenerative spondylosis with resultant mild to moderate spinal stenosis at C3-4 through C6-7 as above. Severe right worse than left C5 and C7 foraminal stenosis. MRI THORACIC SPINE IMPRESSION: 1. Findings consistent with acute osteomyelitis involving the T3, T4, T5, and T6 vertebral bodies, with probable involvement of the anterior aspect of the T10 and T11 vertebral bodies. No convincing evidence for intervening discitis at this time. 2. Mild marrow edema and enhancement involving the right T3-4 and T4-5 facets, consistent with concomitant septic arthritis. 3. Enhancement involving the ventral epidural space at T3 through T6, consistent with mild/early epidural involvement. No frank epidural abscess. 4. Partially visualized left chest wall abscesses/collections with bilateral pleural effusions. 5. Multilevel degenerative spondylosis with resultant mild to moderate diffuse spinal stenosis throughout the thoracic spine as detailed above. MRI LUMBAR SPINE IMPRESSION: 1. 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 No appreciable epidural involvement at this time. 2. Multifocal abscesses involving the bilateral psoas musculature, largest of which on the right measures up to 3.7 cm. 3. Postoperative changes from prior PLIF at L3-4 through L5-S1 without residual spinal stenosis. Collection at the posterior laminectomy site felt to be most consistent with a benign  postoperative seroma. 4. 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 Electronically Signed   By: Rise Mu M.D.   On: 02/15/2021 03:11   CT SHOULDER LEFT WO CONTRAST  Result Date: 02/14/2021 CLINICAL DATA:  Septic arthritis suspected, shoulder, xray done septic arthritis left shoulder EXAM: CT OF THE UPPER LEFT EXTREMITY WITHOUT CONTRAST TECHNIQUE: Multidetector CT imaging of the upper left extremity was performed according to the standard protocol. COMPARISON:  CT 02/09/2021 FINDINGS: Bones/Joint/Cartilage There is no evidence of acute fracture. There is no new osseous erosion, destruction, or periosteal reaction. Persisting gas containing glenohumeral joint effusion and distention of the subacromial-subdeltoid bursa. Some of this gas/fluid is collecting in the subcoracoid recess. Ligaments Suboptimally assessed by CT. Muscles and Tendons There is persisting gas and fluid seen along the left upper chest wall/axilla, with most superior extent extending up the neck to the level of C6-C7 (axial image 3). There is persistent foci of intercostal gas (axial image 102/105). There is paraspinal muscle gas is well (axial image 82 and 51). Discrete abscesses are difficult to measure, with most well-defined area measuring 4.0 x 1.9 cm in the transverse plane by 7.5 cm in craniocaudal extent. Soft tissues As above. Left axillary lymphadenopathy. Small left pleural effusion. There is surgical material, likely packing along the left posterolateral soft tissues on axial image 112). IMPRESSION: Persistent, extensive abscess along the left chest wall/axilla, with visible gas/fluid extending superiorly in the neck to the level of C6/C7, and posteriorly to the paraspinal musculature. Most well-defined collection is within the axilla measuring 4.0 x 1.9 cm in the transverse plane and 7.5 cm in craniocaudal extent. Gas containing left shoulder joint effusion and  distension of the subacromial-subdeltoid bursa, concerning for septic arthritis/bursitis, though these findings could in part be related to recent surgery. No new osseous abnormality to suggest osteomyelitis, though note CT is insensitive in detecting osteomyelitis. Electronically Signed   By: Caprice Renshaw   On: 02/14/2021 07:34   CT SHOULDER RIGHT WO CONTRAST  Result Date: 02/14/2021 CLINICAL DATA:  Septic arthritis suspected, shoulder, xray done septic arthritis right shoulder EXAM: CT OF THE UPPER RIGHT EXTREMITY WITHOUT CONTRAST TECHNIQUE: Multidetector CT imaging of the upper right extremity was performed according to the standard protocol. COMPARISON:  Right shoulder MRI 02/07/2021, CT 02/01/2021 FINDINGS: Bones/Joint/Cartilage Postsurgical changes of the right shoulder, including AC joint resection. There is soft tissue gas in fluid anteriorly, likely surgical related. There a small glenohumeral joint effusion and small amount of fluid in the subacromial-subdeltoid bursa. Ligaments Suboptimally assessed by CT. Muscles and Tendons Findings of full-thickness cuff tear. Soft tissues There is soft tissue swelling along the shoulder. In subcutaneous edema extending down the arm. IMPRESSION: Postsurgical changes at the right shoulder including resection at the [facility] joint. Soft tissue gas and fluid anteriorly, likely related to recent surgery. Small glenohumeral joint effusion with mild distention of the subacromial-subdeltoid bursa. There is soft tissue edema without no well-defined soft tissue abscess along the right shoulder. No new osseous changes to suggest osteomyelitis, though note CT is insensitive in detecting osteomyelitis. Electronically Signed   By: Caprice Renshaw   On: 02/14/2021 07:44   DG CHEST PORT 1 VIEW  Result Date: 02/13/2021 CLINICAL DATA:  Respiratory compromise EXAM: PORTABLE CHEST 1 VIEW COMPARISON:  02/10/2021 FINDINGS: Low lung volumes. Hazy atelectasis at the bases. Small left pleural  effusion. Stable cardiomediastinal silhouette. No pneumothorax. IMPRESSION: Low lung volumes with small left effusion and hazy atelectasis at the bases Electronically Signed   By: Jasmine Pang  M.D.   On: 02/13/2021 23:28   CT IMAGE GUIDED DRAINAGE BY PERCUTANEOUS CATHETER  Result Date: 02/16/2021 INDICATION: History of disseminated MSSA infection, now with bilateral paraspinal abscesses. Please perform CT-guided aspiration and/or drainage catheter placement for infection source control purposes. EXAM: CT GUIDED RETROPERITONEAL ABSCESS DRAINAGE CATHETER PLACEMENT X2 COMPARISON:  Normal lumbar spine MRI-02/15/2021 MEDICATIONS: The patient is currently admitted to the hospital and receiving intravenous antibiotics. The antibiotics were administered within an appropriate time frame prior to the initiation of the procedure. ANESTHESIA/SEDATION: Moderate (conscious) sedation was employed during this procedure. A total of Versed 2 mg and Fentanyl 50 mcg was administered intravenously. Moderate Sedation Time: 14 minutes. The patient's level of consciousness and vital signs were monitored continuously by radiology nursing throughout the procedure under my direct supervision. CONTRAST:  None COMPLICATIONS: None immediate. PROCEDURE: Informed written consent was obtained from the patient after a discussion of the risks, benefits and alternatives to treatment. The patient was placed prone on the CT gantry and a pre procedural CT was performed re-demonstrating the known abscess/fluid collections within the bilateral iliopsoas musculature with dominant right-sided collection measuring at least 3.5 x 3.0 cm and dominant left-sided component measuring at least 2.5 x 1.7 cm (image 37, series 2). The procedure was planned. A timeout was performed prior to the initiation of the procedure. The skin overlying the operative site was prepped and draped in the usual sterile fashion. The overlying soft tissues were anesthetized with 1%  lidocaine with epinephrine. Appropriate trajectory was planned with the use of 22 gauge spinal needles 18 gauge trocar needles were advanced into both retroperitoneal abscesses/fluid collections. Small amount of purulent material was aspirated from both collections in short Amplatz wires were coiled within each collection. Appropriate positioning was confirmed with a limited CT scan. The tract was serially dilated allowing placement of a 10 French all-purpose drainage catheter bilaterally. Appropriate positioning was confirmed with a limited postprocedural CT scan. Approximately 35 cc of purulent fluid was aspirated from the right-sided iliopsoas abscess while approximately 5 cc of bloody fluid was aspirated from the left-sided iliopsoas abscess. Aspirated fluid from the right-sided collection was capped and sent to the laboratory for analysis. Both drainage catheters were connected to JP bulbs and sutured in place. Dressings were applied. The patient tolerated the procedure well without immediate post procedural complication. IMPRESSION: 1. Successful CT guided placement of a 10 [place] all purpose drain catheter into the dominant right-sided retroperitoneal abscess with aspiration of 35 mL of purulent fluid. Samples were sent to the laboratory as requested by the ordering clinical team. 2. Successful CT-guided placement of a 10 French percutaneous drainage catheter into dominant left-sided retroperitoneal abscess with aspiration of approximately 5 cc of bloody fluid. Electronically Signed   By: Simonne Come M.D.   On: 02/16/2021 07:52   CT IMAGE GUIDED DRAINAGE BY PERCUTANEOUS CATHETER  Result Date: 02/16/2021 INDICATION: History of disseminated MSSA infection, now with bilateral paraspinal abscesses. Please perform CT-guided aspiration and/or drainage catheter placement for infection source control purposes. EXAM: CT GUIDED RETROPERITONEAL ABSCESS DRAINAGE CATHETER PLACEMENT X2 COMPARISON:  Normal lumbar spine  MRI-02/15/2021 MEDICATIONS: The patient is currently admitted to the hospital and receiving intravenous antibiotics. The antibiotics were administered within an appropriate time frame prior to the initiation of the procedure. ANESTHESIA/SEDATION: Moderate (conscious) sedation was employed during this procedure. A total of Versed 2 mg and Fentanyl 50 mcg was administered intravenously. Moderate Sedation Time: 14 minutes. The patient's level of consciousness and vital signs were monitored continuously by  radiology nursing throughout the procedure under my direct supervision. CONTRAST:  None COMPLICATIONS: None immediate. PROCEDURE: Informed written consent was obtained from the patient after a discussion of the risks, benefits and alternatives to treatment. The patient was placed prone on the CT gantry and a pre procedural CT was performed re-demonstrating the known abscess/fluid collections within the bilateral iliopsoas musculature with dominant right-sided collection measuring at least 3.5 x 3.0 cm and dominant left-sided component measuring at least 2.5 x 1.7 cm (image 37, series 2). The procedure was planned. A timeout was performed prior to the initiation of the procedure. The skin overlying the operative site was prepped and draped in the usual sterile fashion. The overlying soft tissues were anesthetized with 1% lidocaine with epinephrine. Appropriate trajectory was planned with the use of 22 gauge spinal needles 18 gauge trocar needles were advanced into both retroperitoneal abscesses/fluid collections. Small amount of purulent material was aspirated from both collections in short Amplatz wires were coiled within each collection. Appropriate positioning was confirmed with a limited CT scan. The tract was serially dilated allowing placement of a 10 French all-purpose drainage catheter bilaterally. Appropriate positioning was confirmed with a limited postprocedural CT scan. Approximately 35 cc of purulent fluid  was aspirated from the right-sided iliopsoas abscess while approximately 5 cc of bloody fluid was aspirated from the left-sided iliopsoas abscess. Aspirated fluid from the right-sided collection was capped and sent to the laboratory for analysis. Both drainage catheters were connected to JP bulbs and sutured in place. Dressings were applied. The patient tolerated the procedure well without immediate post procedural complication. IMPRESSION: 1. Successful CT guided placement of a 10 [place] all purpose drain catheter into the dominant right-sided retroperitoneal abscess with aspiration of 35 mL of purulent fluid. Samples were sent to the laboratory as requested by the ordering clinical team. 2. Successful CT-guided placement of a 10 French percutaneous drainage catheter into dominant left-sided retroperitoneal abscess with aspiration of approximately 5 cc of bloody fluid. Electronically Signed   By: Simonne Come M.D.   On: 02/16/2021 07:52   [place] IMAGE GUIDED FLUID DRAIN BY CATHETER  Result Date: 02/15/2021 INDICATION: History of multifocal MSSA infection with indeterminate fluid collection with the left supraclavicular fossa. Please perform ultrasound-guided aspiration and/or drainage catheter placement as indicated. EXAM: ULTRASOUND-GUIDED DRAINAGE CATHETER PLACEMENT INTO ABSCESS WITHIN THE LEFT SUPRACLAVICULAR FOSSA COMPARISON:  Chest CT-02/13/2021; 02/09/2021; 02/01/2021 MEDICATIONS: The patient is currently admitted to the hospital and receiving intravenous antibiotics. The antibiotics were administered within an appropriate time frame prior to the initiation of the procedure. ANESTHESIA/SEDATION: Moderate (conscious) sedation was employed during this procedure. A total of Versed 1.5 mg and Fentanyl 50 mcg was administered intravenously. Moderate Sedation Time: 14 minutes. The patient's level of consciousness and vital signs were monitored continuously by radiology nursing throughout the procedure under my  direct supervision. CONTRAST:  None COMPLICATIONS: None immediate. PROCEDURE: Informed written consent was obtained from the patient after a discussion of the risks, benefits and alternatives to treatment. Patient was positioned slightly upper right on his hospital bed and sonographic evaluation was performed of the left supraclavicular fossa demonstrating a an indeterminate fluid collection compatible with the findings seen on preceding chest CT. The procedure was planned. A timeout was performed prior to the initiation of the procedure. The left supraclavicular fossa was prepped and draped in the usual sterile fashion. The overlying soft tissues were anesthetized with 1% lidocaine with epinephrine. Appropriate trajectory was planned with the use of a 22 gauge spinal needle.  An 18 gauge trocar needle was advanced into the abscess/fluid collection and a short Amplatz super stiff wire was coiled within the collection. Appropriate positioning was confirmed with a limited CT scan. The tract was serially dilated allowing placement of a 10 [place] all-purpose drainage catheter. Appropriate positioning was confirmed with a limited postprocedural CT scan. Next, approximately 40 cc of bloody fluid was aspirated. The tube was connected to a JP bulb and sutured in place. A dressing was applied. The patient tolerated the procedure well without immediate post procedural complication. IMPRESSION: Successful ultrasound guided placement of a 10 French all purpose drain catheter into the left supraclavicular abscess with aspiration of 40 mL of bloody fluid. Samples were sent to the laboratory as requested by the ordering clinical team. Electronically Signed   By: Simonne Come M.D.   On: 02/15/2021 17:01    Labs:  CBC: Recent Labs    02/13/21 0551 02/14/21 0212 02/15/21 0558 02/16/21 0438  WBC 12.1* 10.7* 10.5 9.8  HGB 7.4* 7.6* 7.7* 7.0*  HCT 23.2* 23.2* 24.1* 22.1*  PLT 486* 598* 537* 456*    COAGS: Recent Labs     01/31/21 1059 02/01/21 0022  INR 1.4* 1.3*  APTT 24  --     BMP: Recent Labs    02/25/20 0956 06/23/20 1630 10/09/20 1155 01/31/21 0854 02/13/21 0551 02/14/21 0212 02/15/21 0558 02/16/21 0438  NA 139 138 137   < > 129* 129* 131* 131*  K 5.0 4.6 4.9   < > 4.6 4.5 4.1 4.4  CL 99 97 96   < > 94* 94* 93* 95*  CO2 23 20 22    < > 28 28 30 28   GLUCOSE 216* 413* 308*   < > 181* 157* 126* 138*  BUN 14 12 11    < > 7 7 9 7   CALCIUM 9.7 9.6 10.2   < > 8.3* 8.2* 8.3* 8.1*  CREATININE 0.88 1.08 0.89   < > 0.38* 0.51* 0.59* 0.63  GFRNONAA 99 78 98   < > >60 >60 >60 >60  GFRAA 114 91 113  --   --   --   --   --    < > = values in this interval not displayed.    LIVER FUNCTION TESTS: Recent Labs    02/11/21 0539 02/12/21 0535 02/13/21 0551 02/14/21 0212  BILITOT 0.7 0.9 0.7 0.7  AST 25 29 49* 53*  ALT 15 17 26 30   ALKPHOS 55 61 60 53  PROT 4.9* 5.5* 5.2* 5.2*  ALBUMIN 1.9* 2.0* 1.8* 1.8*    Assessment and Plan:  56 yo male with sepsis/MSSA bacteremia secondary to left chest fluid collection/bilateral shoulder septic arthritis despite multiple OR debridements ; further complicated by development of left neck/supraclavicular fluid collection and bilateral psoas muscle fluid collections concerning for abscesses; s/p drain placement x 3 with Dr. Grace Isaac on 6/23    Pt stable, drains intact, flushe and aspirate well. Puncture sites unremarkable, no s/s of bleeding or infection.  OP L neck 150 cc; L back 15 cc; R back 60 cc VSS WBC 9.8 today (10.5 yesterday); hgb 7.0 (7.9 yesterday) Cx pending  Continue with flushing TID, output recording q shift and dressing changes as needed. Would consider additional imaging when output is less than 10 ml for 24 hours not including flush material.    Further treatment plan per [facility]/Ortho  Appreciate and agree with the plan.  IR to follow.    Electronically Signed: Willette Brace,  PA-C 02/16/2021, 9:47 AM   I spent a total of 25 Minutes  at the the patient's bedside AND on the patient's hospital floor or unit, greater than 50% of which was counseling/coordinating care for abscess drains

## 2021-02-16 NOTE — Progress Notes (Addendum)
Subjective:   Patient is alert, oriented. Frustrated at being asked to reach for things like phone, remote, urinal due to pain in shoulders. Otherwise no complaints, states pain has overall improved from yesterday after drain placement.   Objective:  PE: VITALS:   Vitals:   02/16/21 1251 02/16/21 1300 02/16/21 1404 02/16/21 1504  BP: 119/83 122/77 (!) 142/97 114/83  Pulse: (!) 106 (!) 103 (!) 104   Resp: 16 18 (!) 24 (!) 25  Temp:      TempSrc:      SpO2: 98% 96% 99% 96%  Weight:      Height:        0-20 degrees of active forward flexion of right shoulder, 0-10 deg left shoulder, limited by pain. No erythema. No drainage seen on dressings. Clean dry dressings in place. Distal sensation and capillary refill intact.   LABS  Results for orders placed or performed during the hospital encounter of 01/31/21 (from the past 24 hour(s))  Aerobic/Anaerobic Culture w Gram Stain (surgical/deep wound)     Status: None (Preliminary result)   Collection Time: 02/15/21  5:02 PM   Specimen: Abscess  Result Value Ref Range   Specimen Description      ABSCESS LEFT SHOULDER Performed at [facility], [address] [place], [place] [address]    Special Requests      NONE Performed at [facility], [address], [place], [place] [address]    Gram Stain      NO WBC SEEN NO ORGANISMS SEEN Performed at [facility], [address] [place], [place] [address]    Culture PENDING    Report Status PENDING   Glucose, capillary     Status: None   Collection Time: 02/15/21  5:03 PM  Result Value Ref Range   Glucose-Capillary 76 70 - 99 mg/dL  Aerobic/Anaerobic Culture w Gram Stain (surgical/deep wound)     Status: None (Preliminary result)   Collection Time: 02/15/21  5:06 PM   Specimen: Abscess  Result Value Ref Range   Specimen Description      ABSCESS POST US GUIDED LEFT SUPRAVICULAR ABSCESS DRAIN PLCEMENT Performed at [facility], [address] [place], [place] [address]    Special Requests      NONE Performed at [facility], [address], [place], [place] [address]    Gram Stain      FEW WBC PRESENT,BOTH PMN AND MONONUCLEAR FEW GRAM POSITIVE COCCI IN PAIRS Performed at [facility], [address], [place], [place] [address]    Culture PENDING    Report Status PENDING   Glucose, capillary     Status: Abnormal   Collection Time: 02/15/21  9:47 PM  Result Value Ref Range   Glucose-Capillary 127 (H) 70 - 99 mg/dL  CBC with Differential/Platelet     Status: Abnormal   Collection Time: 02/16/21  4:38 AM  Result Value Ref Range   WBC 9.8 4.0 - 10.5 K/uL   RBC 2.29 (L) 4.22 - 5.81 MIL/uL   Hemoglobin 7.0 (L) 13.0 - 17.0 g/dL   HCT 11.9 (L) 41.7 - 40.8 %   MCV 96.5 80.0 - 100.0 fL   MCH 30.6 26.0 - 34.0 pg   MCHC 31.7 30.0 - 36.0 g/dL   RDW 14.4 81.8 - 56.3 %   Platelets 456 (H) 150 - 400 K/uL   nRBC 0.0 0.0 - 0.2 %  Neutrophils Relative % 76 %   Neutro Abs 7.5 1.7 - 7.7 K/uL   Lymphocytes Relative 15 %   Lymphs Abs 1.4 0.7 - 4.0 K/uL   Monocytes Relative 7 %   Monocytes Absolute 0.7 0.1 - 1.0 K/uL   Eosinophils Relative 1 %   Eosinophils Absolute 0.1 0.0 - 0.5 K/uL   Basophils Relative 0 %   Basophils Absolute 0.0 0.0 - 0.1 K/uL   Immature Granulocytes 1 %   Abs Immature Granulocytes 0.06 0.00 - 0.07 K/uL  Basic metabolic panel     Status: Abnormal   Collection Time: 02/16/21  4:38 AM  Result Value Ref Range   Sodium 131 (L) 135 - 145 mmol/L   Potassium 4.4 3.5 - 5.1 mmol/L   Chloride 95 (L) 98 - 111 mmol/L   CO2 28 22 - 32 mmol/L   Glucose, Bld 138 (H) 70 - 99 mg/dL   BUN 7 6 - 20 mg/dL   Creatinine, Ser 1.61 0.61 - 1.24 mg/dL   Calcium 8.1 (L) 8.9 - 10.3 mg/dL   GFR, Estimated >09 >60 mL/min   Anion gap 8 5 - 15  Magnesium     Status: None   Collection Time: 02/16/21  4:38 AM  Result Value Ref Range   Magnesium 1.7 1.7 - 2.4 mg/dL   Glucose, capillary     Status: Abnormal   Collection Time: 02/16/21  7:48 AM  Result Value Ref Range   Glucose-Capillary 141 (H) 70 - 99 mg/dL  Type and screen [facility]     Status: None (Preliminary result)   Collection Time: 02/16/21  8:50 AM  Result Value Ref Range   ABO/RH(D) B POS    Antibody Screen NEG    Sample Expiration 02/19/2021,2359    Unit Number [id number]    Blood Component Type RED CELLS,LR    Unit division 00    Status of Unit ALLOCATED    Transfusion Status OK TO TRANSFUSE    Crossmatch Result      Compatible Performed at [facility], [address] [place], [place] [address]   Prepare RBC (crossmatch)     Status: None   Collection Time: 02/16/21  8:50 AM  Result Value Ref Range   Order Confirmation      ORDER PROCESSED BY BLOOD BANK Performed at [facility], [address], [place], [place] [address]   Glucose, capillary     Status: Abnormal   Collection Time: 02/16/21 11:43 AM  Result Value Ref Range   Glucose-Capillary 174 (H) 70 - 99 mg/dL    MR CERVICAL SPINE W WO CONTRAST  Result Date: 02/15/2021 CLINICAL DATA:  55 year old male with history of disseminated MSSA infection, evaluate for osteomyelitis discitis. EXAM: MRI CERVICAL, THORACIC AND LUMBAR SPINE WITHOUT AND WITH CONTRAST TECHNIQUE: Multiplanar and multiecho pulse sequences of the cervical spine, to include the craniocervical junction and cervicothoracic junction, and thoracic and lumbar spine, were obtained without and with intravenous contrast. CONTRAST:  10mL GADAVIST GADOBUTROL 1 MMOL/ML IV SOLN COMPARISON:  Prior CT from 02/13/2021. FINDINGS: MRI CERVICAL SPINE FINDINGS Alignment: Examination somewhat technically limited as the patient was unable to complete the entirety of the exam. No postcontrast images of the cervical spine are provided. Mild straightening of the normal cervical lordosis.  No listhesis. Vertebrae: Vertebral  body height maintained without acute or chronic fracture. Diffusely decreased T1 signal intensity seen throughout the visualized bone marrow, nonspecific, but most commonly related to anemia,  smoking, or obesity. No discrete or worrisome osseous lesions. No findings to suggest osteomyelitis discitis or septic arthritis within the cervical spine. Cord: Normal signal and morphology. No epidural abscess or other collection. Posterior Fossa, vertebral arteries, paraspinal tissues: Partially empty sella noted. Visualized brain and posterior fossa otherwise unremarkable. Craniocervical junction normal. Known fluid collections/abscess involving the left chest wall are partially visualized. Largest loculated component seen at the left upper posterior chest and measures 5.3 x 1.6 cm (series 34, image 34). Additional separate and partially visualized loculated component position slightly laterally measures 4.3 x 1.6 cm (series 34, image 3). Scattered foci of internal susceptibility artifact consistent with gas. No extension to involve the underlying spinal canal or vertebral column. Associated scattered ill-defined edema noted within the musculature of the visualized upper posterior chest. Prevertebral soft tissues are within normal limits. Normal flow voids seen within the vertebral arteries bilaterally. Disc levels: C2-C3: Mild uncovertebral hypertrophy without significant disc bulge. Right worse than left facet hypertrophy. No spinal stenosis. Mild to moderate right C3 foraminal narrowing. Left neural foramina remains patent. C3-C4: Left paracentral disc protrusion with slight inferior migration indents the left ventral thecal sac (series 33, image 10). Mild flattening of the left ventral cord without cord signal changes. Mild spinal stenosis. Superimposed uncovertebral and facet hypertrophy with resultant mild to moderate right worse than left C4 foraminal stenosis. C4-C5: Disc desiccation. Broad-based central disc  protrusion indents the ventral thecal sac, eccentric to the left (series 33, image 15). Superimposed uncovertebral and facet hypertrophy. Resultant mild to moderate spinal stenosis. Severe right worse than left C5 foraminal narrowing. C5-C6: Small right paracentral disc protrusion indents the ventral thecal sac (series 34, image 25). Mild spinal stenosis without cord deformity. Superimposed bilateral facet hypertrophy. Mild to moderate left worse than right C6 foraminal narrowing. C6-C7: Disc desiccation with diffuse disc bulge. Superimposed central disc extrusion with inferior migration. Extruded disc material contacts and mildly flattens the ventral cord. No cord signal changes. Moderate spinal stenosis. Superimposed facet and uncovertebral hypertrophy with resultant severe right worse than left C7 foraminal stenosis. C7-T1: Small amount of extruded disc material extends from the C6-7 level above. No other disc pathology at this level. Right worse than left facet hypertrophy. No significant spinal stenosis. Mild right C8 foraminal narrowing. Left neural foramina remains patent. MRI THORACIC SPINE FINDINGS Alignment:  Examination technically limited by motion artifact. Normal alignment with preservation of the normal thoracic kyphosis. No listhesis. Vertebrae: Vertebral body height maintained without acute or chronic fracture. Bone marrow signal intensity diffusely decreased on T1 weighted imaging. 1.5 cm benign hemangioma noted within the T7 vertebral body. No worrisome osseous lesions. Abnormal marrow edema and enhancement seen involving the T3, T4, T5, and T6 vertebral bodies, suspicious for acute osteomyelitis. Probable involvement of the anterior aspects of the T10 and T11 vertebral bodies as well (series 37, image 4) no convincing evidence for intervening discitis at this time. Mild marrow edema seen about the right T3-4 and T4-5 facets, suspected to reflect concomitant septic arthritis (series 37, image 2).  Cord: No convincing cord signal abnormality seen on this motion degraded exam. Thin enhancement seen involving the ventral epidural space at T3 through T6, likely related to mild infection/phlegmon (series 37, image 6). Possible scattered dorsal involvement noted as well. No frank epidural abscess. Paraspinal and other soft tissues: Known left chest wall collections/abscesses are partially visualized, with largest component seen at the upper left posterior chest measuring 11.8 x 1.9 cm (series 38, image 4). Mild paraspinous phlegmon/enhancement  seen adjacent to the infected vertebral bodies, most pronounced at T3-4 and T4-5 (series 39, image 10, 15) no frank paraspinous abscess or collection. Mildly irregular layering bilateral pleural effusions, left greater than right partially visualized. Atelectasis and/or infiltrates noted within the partially visualized lungs. Disc levels: T1-2: Negative interspace. Bilateral facet hypertrophy. No stenosis. T2-3: Facet hypertrophy. Probable right foraminal disc protrusion. No significant spinal stenosis. Mild bilateral foraminal narrowing. T3-4: Negative interspace. Bilateral facet hypertrophy with possible septic arthritis on the right. No significant stenosis. T4-5: Central/right paracentral disc protrusion indents the ventral thecal sac. Flattening of the right ventral cord. Posterior element hypertrophy. Mild spinal stenosis. Foramina remain patent. T5-6: Right paracentral disc protrusion indents the right ventral thecal sac, contacting and flattening the right cord. Moderate spinal stenosis. Foramina remain patent. T6-7: Central/left paracentral disc extrusion with superior migration contacts and flattens the ventral cord. Posterior element hypertrophy with prominence of the epidural fat. Mild to moderate spinal stenosis. Foramina remain patent. T7-8: Shallow left paracentral disc protrusion. Prominence of the dorsal epidural fat with mild posterior element hypertrophy.  Mild spinal stenosis. Foramina remain patent. T8-9: Small central irregular disc protrusion. Mild posterior element hypertrophy. Mild spinal stenosis. Foramina remain patent. T9-10: Left paracentral to foraminal disc protrusion. Posterior element hypertrophy. Mild spinal stenosis. Mild left neural foraminal narrowing. T10-11: Right paracentral disc protrusion with superior migration. Moderate facet hypertrophy. Mild flattening of the ventral cord with mild-to-moderate spinal stenosis. Moderate bilateral foraminal narrowing. T11-12: Right paracentral disc extrusion with superior migration. Secondary flattening of the right ventral cord. Moderate facet hypertrophy. Resultant moderate canal with bilateral foraminal stenosis. T12-L1: Right paracentral disc protrusion. Moderate facet hypertrophy. No significant stenosis. MRI LUMBAR SPINE FINDINGS Segmentation:  Standard. Alignment: 7 mm anterolisthesis of L5 on S1. Trace retrolisthesis of L1 on L2 and L2 on L3. Vertebrae: Sequelae of prior PLIF at L3 through S1. Vertebral body height maintained without acute or chronic fracture. Diffusely decreased T1 signal intensity within the underlying bone marrow. There is abnormal edema involving the L2-3 disc, with marrow edema involving the adjacent L2 and L3 vertebral bodies, consistent with osteomyelitis discitis. Suspected additional involvement of the adjacent L1-2 disc as well. Conus medullaris and cauda equina: Conus extends to the L1 level. Conus and cauda equina appear normal. No visible significant epidural involvement or epidural abscess. Paraspinal and other soft tissues: Abnormal edema with multifocal abscesses seen involving the psoas muscles bilaterally. Largest of these seen on the right and measures 3.7 x 2.8 cm (series 28, image 30). Additional collection at the laminectomy site within the posterior soft tissues measures 2.9 x 2.1 x 5.9 cm, felt to be most consistent with a benign postoperative seroma. Additional  chronic postoperative changes noted elsewhere within the posterior paraspinous soft tissues. No visible SI joint involvement. Disc levels: L1-2: Retrolisthesis. Intervertebral disc space narrowing with mild disc bulge. Moderate facet hypertrophy. No significant spinal stenosis. Mild bilateral L1 foraminal narrowing. L2-3: Trace retrolisthesis. Mild disc bulge. Advanced facet and ligament flavum hypertrophy. Resultant moderate spinal stenosis. Moderate bilateral L2 foraminal narrowing. L3-4:  Prior PLIF.  No residual stenosis. L4-5:  Prior PLIF.  No residual stenosis. L5-S1: Chronic 7 mm anterolisthesis with prior PLIF. No significant residual spinal stenosis. Probable residual mild to moderate foraminal narrowing largely due to slippage. IMPRESSION: MRI CERVICAL SPINE IMPRESSION: 1. No evidence for osteomyelitis discitis within the cervical spine. No epidural abscess or other abnormality. 2. Partially visualized collections/abscesses involving the left upper chest wall, better seen on recent CT examinations. No extension to  involve the underlying spinal canal or vertebral column. 3. Multilevel degenerative spondylosis with resultant mild to moderate spinal stenosis at C3-4 through C6-7 as above. Severe right worse than left C5 and C7 foraminal stenosis. MRI THORACIC SPINE IMPRESSION: 1. Findings consistent with acute osteomyelitis involving the T3, T4, T5, and T6 vertebral bodies, with probable involvement of the anterior aspect of the T10 and T11 vertebral bodies. No convincing evidence for intervening discitis at this time. 2. Mild marrow edema and enhancement involving the right T3-4 and T4-5 facets, consistent with concomitant septic arthritis. 3. Enhancement involving the ventral epidural space at T3 through T6, consistent with mild/early epidural involvement. No frank epidural abscess. 4. Partially visualized left chest wall abscesses/collections with bilateral pleural effusions. 5. Multilevel degenerative  spondylosis with resultant mild to moderate diffuse spinal stenosis throughout the thoracic spine as detailed above. MRI LUMBAR SPINE IMPRESSION: 1. 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 No appreciable epidural involvement at this time. 2. Multifocal abscesses involving the bilateral psoas musculature, largest of which on the right measures up to 3.7 cm. 3. Postoperative changes from prior PLIF at L3-4 through L5-S1 without residual spinal stenosis. Collection at the posterior laminectomy site felt to be most consistent with a benign postoperative seroma. 4. 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 Electronically Signed   By: Rise Mu M.D.   On: 02/15/2021 03:11   MR THORACIC SPINE W WO CONTRAST  Result Date: 02/15/2021 CLINICAL DATA:  55 year old male with history of disseminated MSSA infection, evaluate for osteomyelitis discitis. EXAM: MRI CERVICAL, THORACIC AND LUMBAR SPINE WITHOUT AND WITH CONTRAST TECHNIQUE: Multiplanar and multiecho pulse sequences of the cervical spine, to include the craniocervical junction and cervicothoracic junction, and thoracic and lumbar spine, were obtained without and with intravenous contrast. CONTRAST:  10mL GADAVIST GADOBUTROL 1 MMOL/ML IV SOLN COMPARISON:  Prior CT from 02/13/2021. FINDINGS: MRI CERVICAL SPINE FINDINGS Alignment: Examination somewhat technically limited as the patient was unable to complete the entirety of the exam. No postcontrast images of the cervical spine are provided. Mild straightening of the normal cervical lordosis.  No listhesis. Vertebrae: Vertebral body height maintained without acute or chronic fracture. Diffusely decreased T1 signal intensity seen throughout the visualized bone marrow, nonspecific, but most commonly related to anemia, smoking, or obesity. No discrete or worrisome osseous lesions. No findings to suggest osteomyelitis  discitis or septic arthritis within the cervical spine. Cord: Normal signal and morphology. No epidural abscess or other collection. Posterior Fossa, vertebral arteries, paraspinal tissues: Partially empty sella noted. Visualized brain and posterior fossa otherwise unremarkable. Craniocervical junction normal. Known fluid collections/abscess involving the left chest wall are partially visualized. Largest loculated component seen at the left upper posterior chest and measures 5.3 x 1.6 cm (series 34, image 34). Additional separate and partially visualized loculated component position slightly laterally measures 4.3 x 1.6 cm (series 34, image 3). Scattered foci of internal susceptibility artifact consistent with gas. No extension to involve the underlying spinal canal or vertebral column. Associated scattered ill-defined edema noted within the musculature of the visualized upper posterior chest. Prevertebral soft tissues are within normal limits. Normal flow voids seen within the vertebral arteries bilaterally. Disc levels: C2-C3: Mild uncovertebral hypertrophy without significant disc bulge. Right worse than left facet hypertrophy. No spinal stenosis. Mild to moderate right C3 foraminal narrowing. Left neural foramina remains patent. C3-C4: Left paracentral disc protrusion with slight inferior migration indents the left ventral thecal sac (series  33, image 10). Mild flattening of the left ventral cord without cord signal changes. Mild spinal stenosis. Superimposed uncovertebral and facet hypertrophy with resultant mild to moderate right worse than left C4 foraminal stenosis. C4-C5: Disc desiccation. Broad-based central disc protrusion indents the ventral thecal sac, eccentric to the left (series 33, image 15). Superimposed uncovertebral and facet hypertrophy. Resultant mild to moderate spinal stenosis. Severe right worse than left C5 foraminal narrowing. C5-C6: Small right paracentral disc protrusion indents the  ventral thecal sac (series 34, image 25). Mild spinal stenosis without cord deformity. Superimposed bilateral facet hypertrophy. Mild to moderate left worse than right C6 foraminal narrowing. C6-C7: Disc desiccation with diffuse disc bulge. Superimposed central disc extrusion with inferior migration. Extruded disc material contacts and mildly flattens the ventral cord. No cord signal changes. Moderate spinal stenosis. Superimposed facet and uncovertebral hypertrophy with resultant severe right worse than left C7 foraminal stenosis. C7-T1: Small amount of extruded disc material extends from the C6-7 level above. No other disc pathology at this level. Right worse than left facet hypertrophy. No significant spinal stenosis. Mild right C8 foraminal narrowing. Left neural foramina remains patent. MRI THORACIC SPINE FINDINGS Alignment:  Examination technically limited by motion artifact. Normal alignment with preservation of the normal thoracic kyphosis. No listhesis. Vertebrae: Vertebral body height maintained without acute or chronic fracture. Bone marrow signal intensity diffusely decreased on T1 weighted imaging. 1.5 cm benign hemangioma noted within the T7 vertebral body. No worrisome osseous lesions. Abnormal marrow edema and enhancement seen involving the T3, T4, T5, and T6 vertebral bodies, suspicious for acute osteomyelitis. Probable involvement of the anterior aspects of the T10 and T11 vertebral bodies as well (series 37, image 4) no convincing evidence for intervening discitis at this time. Mild marrow edema seen about the right T3-4 and T4-5 facets, suspected to reflect concomitant septic arthritis (series 37, image 2). Cord: No convincing cord signal abnormality seen on this motion degraded exam. Thin enhancement seen involving the ventral epidural space at T3 through T6, likely related to mild infection/phlegmon (series 37, image 6). Possible scattered dorsal involvement noted as well. No frank epidural  abscess. Paraspinal and other soft tissues: Known left chest wall collections/abscesses are partially visualized, with largest component seen at the upper left posterior chest measuring 11.8 x 1.9 cm (series 38, image 4). Mild paraspinous phlegmon/enhancement seen adjacent to the infected vertebral bodies, most pronounced at T3-4 and T4-5 (series 39, image 10, 15) no frank paraspinous abscess or collection. Mildly irregular layering bilateral pleural effusions, left greater than right partially visualized. Atelectasis and/or infiltrates noted within the partially visualized lungs. Disc levels: T1-2: Negative interspace. Bilateral facet hypertrophy. No stenosis. T2-3: Facet hypertrophy. Probable right foraminal disc protrusion. No significant spinal stenosis. Mild bilateral foraminal narrowing. T3-4: Negative interspace. Bilateral facet hypertrophy with possible septic arthritis on the right. No significant stenosis. T4-5: Central/right paracentral disc protrusion indents the ventral thecal sac. Flattening of the right ventral cord. Posterior element hypertrophy. Mild spinal stenosis. Foramina remain patent. T5-6: Right paracentral disc protrusion indents the right ventral thecal sac, contacting and flattening the right cord. Moderate spinal stenosis. Foramina remain patent. T6-7: Central/left paracentral disc extrusion with superior migration contacts and flattens the ventral cord. Posterior element hypertrophy with prominence of the epidural fat. Mild to moderate spinal stenosis. Foramina remain patent. T7-8: Shallow left paracentral disc protrusion. Prominence of the dorsal epidural fat with mild posterior element hypertrophy. Mild spinal stenosis. Foramina remain patent. T8-9: Small central irregular disc protrusion. Mild posterior element hypertrophy. Mild  spinal stenosis. Foramina remain patent. T9-10: Left paracentral to foraminal disc protrusion. Posterior element hypertrophy. Mild spinal stenosis. Mild left  neural foraminal narrowing. T10-11: Right paracentral disc protrusion with superior migration. Moderate facet hypertrophy. Mild flattening of the ventral cord with mild-to-moderate spinal stenosis. Moderate bilateral foraminal narrowing. T11-12: Right paracentral disc extrusion with superior migration. Secondary flattening of the right ventral cord. Moderate facet hypertrophy. Resultant moderate canal with bilateral foraminal stenosis. T12-L1: Right paracentral disc protrusion. Moderate facet hypertrophy. No significant stenosis. MRI LUMBAR SPINE FINDINGS Segmentation:  Standard. Alignment: 7 mm anterolisthesis of L5 on S1. Trace retrolisthesis of L1 on L2 and L2 on L3. Vertebrae: Sequelae of prior PLIF at L3 through S1. Vertebral body height maintained without acute or chronic fracture. Diffusely decreased T1 signal intensity within the underlying bone marrow. There is abnormal edema involving the L2-3 disc, with marrow edema involving the adjacent L2 and L3 vertebral bodies, consistent with osteomyelitis discitis. Suspected additional involvement of the adjacent L1-2 disc as well. Conus medullaris and cauda equina: Conus extends to the L1 level. Conus and cauda equina appear normal. No visible significant epidural involvement or epidural abscess. Paraspinal and other soft tissues: Abnormal edema with multifocal abscesses seen involving the psoas muscles bilaterally. Largest of these seen on the right and measures 3.7 x 2.8 cm (series 28, image 30). Additional collection at the laminectomy site within the posterior soft tissues measures 2.9 x 2.1 x 5.9 cm, felt to be most consistent with a benign postoperative seroma. Additional chronic postoperative changes noted elsewhere within the posterior paraspinous soft tissues. No visible SI joint involvement. Disc levels: L1-2: Retrolisthesis. Intervertebral disc space narrowing with mild disc bulge. Moderate facet hypertrophy. No significant spinal stenosis. Mild  bilateral L1 foraminal narrowing. L2-3: Trace retrolisthesis. Mild disc bulge. Advanced facet and ligament flavum hypertrophy. Resultant moderate spinal stenosis. Moderate bilateral L2 foraminal narrowing. L3-4:  Prior PLIF.  No residual stenosis. L4-5:  Prior PLIF.  No residual stenosis. L5-S1: Chronic 7 mm anterolisthesis with prior PLIF. No significant residual spinal stenosis. Probable residual mild to moderate foraminal narrowing largely due to slippage. IMPRESSION: MRI CERVICAL SPINE IMPRESSION: 1. No evidence for osteomyelitis discitis within the cervical spine. No epidural abscess or other abnormality. 2. Partially visualized collections/abscesses involving the left upper chest wall, better seen on recent CT examinations. No extension to involve the underlying spinal canal or vertebral column. 3. Multilevel degenerative spondylosis with resultant mild to moderate spinal stenosis at C3-4 through C6-7 as above. Severe right worse than left C5 and C7 foraminal stenosis. MRI THORACIC SPINE IMPRESSION: 1. Findings consistent with acute osteomyelitis involving the T3, T4, T5, and T6 vertebral bodies, with probable involvement of the anterior aspect of the T10 and T11 vertebral bodies. No convincing evidence for intervening discitis at this time. 2. Mild marrow edema and enhancement involving the right T3-4 and T4-5 facets, consistent with concomitant septic arthritis. 3. Enhancement involving the ventral epidural space at T3 through T6, consistent with mild/early epidural involvement. No frank epidural abscess. 4. Partially visualized left chest wall abscesses/collections with bilateral pleural effusions. 5. Multilevel degenerative spondylosis with resultant mild to moderate diffuse spinal stenosis throughout the thoracic spine as detailed above. MRI LUMBAR SPINE IMPRESSION: 1. 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 No appreciable epidural involvement at this time. 2. Multifocal  abscesses involving the bilateral psoas musculature, largest of which on the right measures up to 3.7 cm. 3. Postoperative changes from prior PLIF at L3-4 through L5-S1 without residual  spinal stenosis. Collection at the posterior laminectomy site felt to be most consistent with a benign postoperative seroma. 4. 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 Electronically Signed   By: Rise Mu M.D.   On: 02/15/2021 03:11   MR Lumbar Spine W Wo Contrast  Result Date: 02/15/2021 CLINICAL DATA:  55 year old male with history of disseminated MSSA infection, evaluate for osteomyelitis discitis. EXAM: MRI CERVICAL, THORACIC AND LUMBAR SPINE WITHOUT AND WITH CONTRAST TECHNIQUE: Multiplanar and multiecho pulse sequences of the cervical spine, to include the craniocervical junction and cervicothoracic junction, and thoracic and lumbar spine, were obtained without and with intravenous contrast. CONTRAST:  10mL GADAVIST GADOBUTROL 1 MMOL/ML IV SOLN COMPARISON:  Prior CT from 02/13/2021. FINDINGS: MRI CERVICAL SPINE FINDINGS Alignment: Examination somewhat technically limited as the patient was unable to complete the entirety of the exam. No postcontrast images of the cervical spine are provided. Mild straightening of the normal cervical lordosis.  No listhesis. Vertebrae: Vertebral body height maintained without acute or chronic fracture. Diffusely decreased T1 signal intensity seen throughout the visualized bone marrow, nonspecific, but most commonly related to anemia, smoking, or obesity. No discrete or worrisome osseous lesions. No findings to suggest osteomyelitis discitis or septic arthritis within the cervical spine. Cord: Normal signal and morphology. No epidural abscess or other collection. Posterior Fossa, vertebral arteries, paraspinal tissues: Partially empty sella noted. Visualized brain and posterior fossa otherwise unremarkable. Craniocervical  junction normal. Known fluid collections/abscess involving the left chest wall are partially visualized. Largest loculated component seen at the left upper posterior chest and measures 5.3 x 1.6 cm (series 34, image 34). Additional separate and partially visualized loculated component position slightly laterally measures 4.3 x 1.6 cm (series 34, image 3). Scattered foci of internal susceptibility artifact consistent with gas. No extension to involve the underlying spinal canal or vertebral column. Associated scattered ill-defined edema noted within the musculature of the visualized upper posterior chest. Prevertebral soft tissues are within normal limits. Normal flow voids seen within the vertebral arteries bilaterally. Disc levels: C2-C3: Mild uncovertebral hypertrophy without significant disc bulge. Right worse than left facet hypertrophy. No spinal stenosis. Mild to moderate right C3 foraminal narrowing. Left neural foramina remains patent. C3-C4: Left paracentral disc protrusion with slight inferior migration indents the left ventral thecal sac (series 33, image 10). Mild flattening of the left ventral cord without cord signal changes. Mild spinal stenosis. Superimposed uncovertebral and facet hypertrophy with resultant mild to moderate right worse than left C4 foraminal stenosis. C4-C5: Disc desiccation. Broad-based central disc protrusion indents the ventral thecal sac, eccentric to the left (series 33, image 15). Superimposed uncovertebral and facet hypertrophy. Resultant mild to moderate spinal stenosis. Severe right worse than left C5 foraminal narrowing. C5-C6: Small right paracentral disc protrusion indents the ventral thecal sac (series 34, image 25). Mild spinal stenosis without cord deformity. Superimposed bilateral facet hypertrophy. Mild to moderate left worse than right C6 foraminal narrowing. C6-C7: Disc desiccation with diffuse disc bulge. Superimposed central disc extrusion with inferior  migration. Extruded disc material contacts and mildly flattens the ventral cord. No cord signal changes. Moderate spinal stenosis. Superimposed facet and uncovertebral hypertrophy with resultant severe right worse than left C7 foraminal stenosis. C7-T1: Small amount of extruded disc material extends from the C6-7 level above. No other disc pathology at this level. Right worse than left facet hypertrophy. No significant spinal stenosis. Mild right C8 foraminal narrowing. Left neural foramina remains patent. MRI THORACIC SPINE FINDINGS Alignment:  Examination technically limited by motion artifact. Normal alignment with preservation of the normal thoracic kyphosis. No listhesis. Vertebrae: Vertebral body height maintained without acute or chronic fracture. Bone marrow signal intensity diffusely decreased on T1 weighted imaging. 1.5 cm benign hemangioma noted within the T7 vertebral body. No worrisome osseous lesions. Abnormal marrow edema and enhancement seen involving the T3, T4, T5, and T6 vertebral bodies, suspicious for acute osteomyelitis. Probable involvement of the anterior aspects of the T10 and T11 vertebral bodies as well (series 37, image 4) no convincing evidence for intervening discitis at this time. Mild marrow edema seen about the right T3-4 and T4-5 facets, suspected to reflect concomitant septic arthritis (series 37, image 2). Cord: No convincing cord signal abnormality seen on this motion degraded exam. Thin enhancement seen involving the ventral epidural space at T3 through T6, likely related to mild infection/phlegmon (series 37, image 6). Possible scattered dorsal involvement noted as well. No frank epidural abscess. Paraspinal and other soft tissues: Known left chest wall collections/abscesses are partially visualized, with largest component seen at the upper left posterior chest measuring 11.8 x 1.9 cm (series 38, image 4). Mild paraspinous phlegmon/enhancement seen adjacent to the infected  vertebral bodies, most pronounced at T3-4 and T4-5 (series 39, image 10, 15) no frank paraspinous abscess or collection. Mildly irregular layering bilateral pleural effusions, left greater than right partially visualized. Atelectasis and/or infiltrates noted within the partially visualized lungs. Disc levels: T1-2: Negative interspace. Bilateral facet hypertrophy. No stenosis. T2-3: Facet hypertrophy. Probable right foraminal disc protrusion. No significant spinal stenosis. Mild bilateral foraminal narrowing. T3-4: Negative interspace. Bilateral facet hypertrophy with possible septic arthritis on the right. No significant stenosis. T4-5: Central/right paracentral disc protrusion indents the ventral thecal sac. Flattening of the right ventral cord. Posterior element hypertrophy. Mild spinal stenosis. Foramina remain patent. T5-6: Right paracentral disc protrusion indents the right ventral thecal sac, contacting and flattening the right cord. Moderate spinal stenosis. Foramina remain patent. T6-7: Central/left paracentral disc extrusion with superior migration contacts and flattens the ventral cord. Posterior element hypertrophy with prominence of the epidural fat. Mild to moderate spinal stenosis. Foramina remain patent. T7-8: Shallow left paracentral disc protrusion. Prominence of the dorsal epidural fat with mild posterior element hypertrophy. Mild spinal stenosis. Foramina remain patent. T8-9: Small central irregular disc protrusion. Mild posterior element hypertrophy. Mild spinal stenosis. Foramina remain patent. T9-10: Left paracentral to foraminal disc protrusion. Posterior element hypertrophy. Mild spinal stenosis. Mild left neural foraminal narrowing. T10-11: Right paracentral disc protrusion with superior migration. Moderate facet hypertrophy. Mild flattening of the ventral cord with mild-to-moderate spinal stenosis. Moderate bilateral foraminal narrowing. T11-12: Right paracentral disc extrusion with  superior migration. Secondary flattening of the right ventral cord. Moderate facet hypertrophy. Resultant moderate canal with bilateral foraminal stenosis. T12-L1: Right paracentral disc protrusion. Moderate facet hypertrophy. No significant stenosis. MRI LUMBAR SPINE FINDINGS Segmentation:  Standard. Alignment: 7 mm anterolisthesis of L5 on S1. Trace retrolisthesis of L1 on L2 and L2 on L3. Vertebrae: Sequelae of prior PLIF at L3 through S1. Vertebral body height maintained without acute or chronic fracture. Diffusely decreased T1 signal intensity within the underlying bone marrow. There is abnormal edema involving the L2-3 disc, with marrow edema involving the adjacent L2 and L3 vertebral bodies, consistent with osteomyelitis discitis. Suspected additional involvement of the adjacent L1-2 disc as well. Conus medullaris and cauda equina: Conus extends to the L1 level. Conus and cauda equina appear normal. No visible significant epidural involvement or epidural abscess. Paraspinal and other soft tissues:  Abnormal edema with multifocal abscesses seen involving the psoas muscles bilaterally. Largest of these seen on the right and measures 3.7 x 2.8 cm (series 28, image 30). Additional collection at the laminectomy site within the posterior soft tissues measures 2.9 x 2.1 x 5.9 cm, felt to be most consistent with a benign postoperative seroma. Additional chronic postoperative changes noted elsewhere within the posterior paraspinous soft tissues. No visible SI joint involvement. Disc levels: L1-2: Retrolisthesis. Intervertebral disc space narrowing with mild disc bulge. Moderate facet hypertrophy. No significant spinal stenosis. Mild bilateral L1 foraminal narrowing. L2-3: Trace retrolisthesis. Mild disc bulge. Advanced facet and ligament flavum hypertrophy. Resultant moderate spinal stenosis. Moderate bilateral L2 foraminal narrowing. L3-4:  Prior PLIF.  No residual stenosis. L4-5:  Prior PLIF.  No residual stenosis.  L5-S1: Chronic 7 mm anterolisthesis with prior PLIF. No significant residual spinal stenosis. Probable residual mild to moderate foraminal narrowing largely due to slippage. IMPRESSION: MRI CERVICAL SPINE IMPRESSION: 1. No evidence for osteomyelitis discitis within the cervical spine. No epidural abscess or other abnormality. 2. Partially visualized collections/abscesses involving the left upper chest wall, better seen on recent CT examinations. No extension to involve the underlying spinal canal or vertebral column. 3. Multilevel degenerative spondylosis with resultant mild to moderate spinal stenosis at C3-4 through C6-7 as above. Severe right worse than left C5 and C7 foraminal stenosis. MRI THORACIC SPINE IMPRESSION: 1. Findings consistent with acute osteomyelitis involving the T3, T4, T5, and T6 vertebral bodies, with probable involvement of the anterior aspect of the T10 and T11 vertebral bodies. No convincing evidence for intervening discitis at this time. 2. Mild marrow edema and enhancement involving the right T3-4 and T4-5 facets, consistent with concomitant septic arthritis. 3. Enhancement involving the ventral epidural space at T3 through T6, consistent with mild/early epidural involvement. No frank epidural abscess. 4. Partially visualized left chest wall abscesses/collections with bilateral pleural effusions. 5. Multilevel degenerative spondylosis with resultant mild to moderate diffuse spinal stenosis throughout the thoracic spine as detailed above. MRI LUMBAR SPINE IMPRESSION: 1. 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 No appreciable epidural involvement at this time. 2. Multifocal abscesses involving the bilateral psoas musculature, largest of which on the right measures up to 3.7 cm. 3. Postoperative changes from prior PLIF at L3-4 through L5-S1 without residual spinal stenosis. Collection at the posterior laminectomy site felt to be most consistent with a benign  postoperative seroma. 4. 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 Electronically Signed   By: Rise Mu M.D.   On: 02/15/2021 03:11   CT IMAGE GUIDED DRAINAGE BY PERCUTANEOUS CATHETER  Result Date: 02/16/2021 INDICATION: History of disseminated MSSA infection, now with bilateral paraspinal abscesses. Please perform CT-guided aspiration and/or drainage catheter placement for infection source control purposes. EXAM: CT GUIDED RETROPERITONEAL ABSCESS DRAINAGE CATHETER PLACEMENT X2 COMPARISON:  Normal lumbar spine MRI-02/15/2021 MEDICATIONS: The patient is currently admitted to the hospital and receiving intravenous antibiotics. The antibiotics were administered within an appropriate time frame prior to the initiation of the procedure. ANESTHESIA/SEDATION: Moderate (conscious) sedation was employed during this procedure. A total of Versed 2 mg and Fentanyl 50 mcg was administered intravenously. Moderate Sedation Time: 14 minutes. The patient's level of consciousness and vital signs were monitored continuously by radiology nursing throughout the procedure under my direct supervision. CONTRAST:  None COMPLICATIONS: None immediate. PROCEDURE: Informed written consent was obtained from the patient after a discussion of the risks, benefits and alternatives to  treatment. The patient was placed prone on the CT gantry and a pre procedural CT was performed re-demonstrating the known abscess/fluid collections within the bilateral iliopsoas musculature with dominant right-sided collection measuring at least 3.5 x 3.0 cm and dominant left-sided component measuring at least 2.5 x 1.7 cm (image 37, series 2). The procedure was planned. A timeout was performed prior to the initiation of the procedure. The skin overlying the operative site was prepped and draped in the usual sterile fashion. The overlying soft tissues were anesthetized with 1% lidocaine with  epinephrine. Appropriate trajectory was planned with the use of 22 gauge spinal needles 18 gauge trocar needles were advanced into both retroperitoneal abscesses/fluid collections. Small amount of purulent material was aspirated from both collections in short Amplatz wires were coiled within each collection. Appropriate positioning was confirmed with a limited CT scan. The tract was serially dilated allowing placement of a 10 French all-purpose drainage catheter bilaterally. Appropriate positioning was confirmed with a limited postprocedural CT scan. Approximately 35 cc of purulent fluid was aspirated from the right-sided iliopsoas abscess while approximately 5 cc of bloody fluid was aspirated from the left-sided iliopsoas abscess. Aspirated fluid from the right-sided collection was capped and sent to the laboratory for analysis. Both drainage catheters were connected to JP bulbs and sutured in place. Dressings were applied. The patient tolerated the procedure well without immediate post procedural complication. IMPRESSION: 1. Successful CT guided placement of a 10 [place] all purpose drain catheter into the dominant right-sided retroperitoneal abscess with aspiration of 35 mL of purulent fluid. Samples were sent to the laboratory as requested by the ordering clinical team. 2. Successful CT-guided placement of a 10 French percutaneous drainage catheter into dominant left-sided retroperitoneal abscess with aspiration of approximately 5 cc of bloody fluid. Electronically Signed   By: Simonne Come M.D.   On: 02/16/2021 07:52   CT IMAGE GUIDED DRAINAGE BY PERCUTANEOUS CATHETER  Result Date: 02/16/2021 INDICATION: History of disseminated MSSA infection, now with bilateral paraspinal abscesses. Please perform CT-guided aspiration and/or drainage catheter placement for infection source control purposes. EXAM: CT GUIDED RETROPERITONEAL ABSCESS DRAINAGE CATHETER PLACEMENT X2 COMPARISON:  Normal lumbar spine MRI-02/15/2021  MEDICATIONS: The patient is currently admitted to the hospital and receiving intravenous antibiotics. The antibiotics were administered within an appropriate time frame prior to the initiation of the procedure. ANESTHESIA/SEDATION: Moderate (conscious) sedation was employed during this procedure. A total of Versed 2 mg and Fentanyl 50 mcg was administered intravenously. Moderate Sedation Time: 14 minutes. The patient's level of consciousness and vital signs were monitored continuously by radiology nursing throughout the procedure under my direct supervision. CONTRAST:  None COMPLICATIONS: None immediate. PROCEDURE: Informed written consent was obtained from the patient after a discussion of the risks, benefits and alternatives to treatment. The patient was placed prone on the CT gantry and a pre procedural CT was performed re-demonstrating the known abscess/fluid collections within the bilateral iliopsoas musculature with dominant right-sided collection measuring at least 3.5 x 3.0 cm and dominant left-sided component measuring at least 2.5 x 1.7 cm (image 37, series 2). The procedure was planned. A timeout was performed prior to the initiation of the procedure. The skin overlying the operative site was prepped and draped in the usual sterile fashion. The overlying soft tissues were anesthetized with 1% lidocaine with epinephrine. Appropriate trajectory was planned with the use of 22 gauge spinal needles 18 gauge trocar needles were advanced into both retroperitoneal abscesses/fluid collections. Small amount of purulent material was aspirated from both  collections in short Amplatz wires were coiled within each collection. Appropriate positioning was confirmed with a limited CT scan. The tract was serially dilated allowing placement of a 10 French all-purpose drainage catheter bilaterally. Appropriate positioning was confirmed with a limited postprocedural CT scan. Approximately 35 cc of purulent fluid was aspirated  from the right-sided iliopsoas abscess while approximately 5 cc of bloody fluid was aspirated from the left-sided iliopsoas abscess. Aspirated fluid from the right-sided collection was capped and sent to the laboratory for analysis. Both drainage catheters were connected to JP bulbs and sutured in place. Dressings were applied. The patient tolerated the procedure well without immediate post procedural complication. IMPRESSION: 1. Successful CT guided placement of a 10 [place] all purpose drain catheter into the dominant right-sided retroperitoneal abscess with aspiration of 35 mL of purulent fluid. Samples were sent to the laboratory as requested by the ordering clinical team. 2. Successful CT-guided placement of a 10 French percutaneous drainage catheter into dominant left-sided retroperitoneal abscess with aspiration of approximately 5 cc of bloody fluid. Electronically Signed   By: Simonne Come M.D.   On: 02/16/2021 07:52   [place] IMAGE GUIDED FLUID DRAIN BY CATHETER  Result Date: 02/15/2021 INDICATION: History of multifocal MSSA infection with indeterminate fluid collection with the left supraclavicular fossa. Please perform ultrasound-guided aspiration and/or drainage catheter placement as indicated. EXAM: ULTRASOUND-GUIDED DRAINAGE CATHETER PLACEMENT INTO ABSCESS WITHIN THE LEFT SUPRACLAVICULAR FOSSA COMPARISON:  Chest CT-02/13/2021; 02/09/2021; 02/01/2021 MEDICATIONS: The patient is currently admitted to the hospital and receiving intravenous antibiotics. The antibiotics were administered within an appropriate time frame prior to the initiation of the procedure. ANESTHESIA/SEDATION: Moderate (conscious) sedation was employed during this procedure. A total of Versed 1.5 mg and Fentanyl 50 mcg was administered intravenously. Moderate Sedation Time: 14 minutes. The patient's level of consciousness and vital signs were monitored continuously by radiology nursing throughout the procedure under my direct supervision.  CONTRAST:  None COMPLICATIONS: None immediate. PROCEDURE: Informed written consent was obtained from the patient after a discussion of the risks, benefits and alternatives to treatment. Patient was positioned slightly upper right on his hospital bed and sonographic evaluation was performed of the left supraclavicular fossa demonstrating a an indeterminate fluid collection compatible with the findings seen on preceding chest CT. The procedure was planned. A timeout was performed prior to the initiation of the procedure. The left supraclavicular fossa was prepped and draped in the usual sterile fashion. The overlying soft tissues were anesthetized with 1% lidocaine with epinephrine. Appropriate trajectory was planned with the use of a 22 gauge spinal needle. An 18 gauge trocar needle was advanced into the abscess/fluid collection and a short Amplatz super stiff wire was coiled within the collection. Appropriate positioning was confirmed with a limited CT scan. The tract was serially dilated allowing placement of a 10 [place] all-purpose drainage catheter. Appropriate positioning was confirmed with a limited postprocedural CT scan. Next, approximately 40 cc of bloody fluid was aspirated. The tube was connected to a JP bulb and sutured in place. A dressing was applied. The patient tolerated the procedure well without immediate post procedural complication. IMPRESSION: Successful ultrasound guided placement of a 10 French all purpose drain catheter into the left supraclavicular abscess with aspiration of 40 mL of bloody fluid. Samples were sent to the laboratory as requested by the ordering clinical team. Electronically Signed   By: Simonne Come M.D.   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 - L shoulder  irrigation and debridement 6/16 - R shoulder irrigation and debridement with distal clavicle resection 6/17 - s/p drain placement 6/24 with IR for  perispinal and psoas abscesses -continue daily dressing changes to shoulders - s/p drain placement 6/24 with IR - WBAT bilateral shoulders, can use slings for comfort if he likes - plan for repeat imagine Monday, with possible repeat shoulder washout tuesday  Contact information:   Weekdays 8-5 Janine Ores, PA-C [phone] A fter hours and holidays please check [email or web address] for group call information for [facility]  Armida Sans 02/16/2021,

## 2021-02-16 NOTE — Progress Notes (Signed)
Inpatient Rehabilitation Admissions Coordinator   Patient not at a level for Cir admit and insurance denied first request for CIR during this hospitalization. We will sign off at this time. Please re consult if appropriate. I have notified Olegario Messier with TOC.  Ottie Glazier, RN, MSN Rehab Admissions Coordinator [phone] 02/16/2021 12:03 PM

## 2021-02-16 NOTE — Consult Note (Signed)
WOC Nurse wound follow up Wound type: left chest surgical wounds Measurement: na Wound bed: both wound beds are beefy red with granulation tissue Drainage (amount, consistency, odor) purulent Periwound: some erythema to largest wound Dressing procedure/placement/frequency: All pieces of white and black foam removed AFTER patient was medicated.  He used his PCA throughout.  The charge nurse assisted hands on and also coached the patient through breathing exercises.  The patient's nephew was in the room providing support as well. One piece of white foam inserted as far as I could get it into the the larger wound tunnel, then covered with black foam. The smaller wound was filled with black foam. These two were bridged together and immediate seal obtained. A new canister was in the VAC. Helmut Muster, RN, MSN, CWOCN, CNS-BC, pager [phone]

## 2021-02-17 LAB — BPAM RBC
Blood Product Expiration Date: 202207162359
ISSUE DATE / TIME: 202206241929
Unit Type and Rh: 7300

## 2021-02-17 LAB — BASIC METABOLIC PANEL
Anion gap: 6 (ref 5–15)
BUN: 6 mg/dL (ref 6–20)
CO2: 29 mmol/L (ref 22–32)
Calcium: 8.1 mg/dL — ABNORMAL LOW (ref 8.9–10.3)
Chloride: 94 mmol/L — ABNORMAL LOW (ref 98–111)
Creatinine, Ser: 0.52 mg/dL — ABNORMAL LOW (ref 0.61–1.24)
GFR, Estimated: >60 mL/min (ref >60)
Glucose, Bld: 115 mg/dL — ABNORMAL HIGH (ref 70–99)
Potassium: 4.3 mmol/L (ref 3.5–5.1)
Sodium: 129 mmol/L — ABNORMAL LOW (ref 135–145)

## 2021-02-17 LAB — CBC WITH DIFFERENTIAL/PLATELET
Abs Immature Granulocytes: 0.06 10*3/uL (ref 0.00–0.07)
Basophils Absolute: 0 10*3/uL (ref 0.0–0.1)
Basophils Relative: 0 %
Eosinophils Absolute: 0.1 10*3/uL (ref 0.0–0.5)
Eosinophils Relative: 1 %
HCT: 23.1 % — ABNORMAL LOW (ref 39.0–52.0)
Hemoglobin: 7.5 g/dL — ABNORMAL LOW (ref 13.0–17.0)
Immature Granulocytes: 1 %
Lymphocytes Relative: 17 %
Lymphs Abs: 1.6 10*3/uL (ref 0.7–4.0)
MCH: 30 pg (ref 26.0–34.0)
MCHC: 32.5 g/dL (ref 30.0–36.0)
MCV: 92.4 fL (ref 80.0–100.0)
Monocytes Absolute: 0.8 10*3/uL (ref 0.1–1.0)
Monocytes Relative: 8 %
Neutro Abs: 7 10*3/uL (ref 1.7–7.7)
Neutrophils Relative %: 73 %
Platelets: 573 10*3/uL — ABNORMAL HIGH (ref 150–400)
RBC: 2.5 MIL/uL — ABNORMAL LOW (ref 4.22–5.81)
RDW: 15.2 % (ref 11.5–15.5)
WBC: 9.6 10*3/uL (ref 4.0–10.5)
nRBC: 0 % (ref 0.0–0.2)

## 2021-02-17 LAB — GLUCOSE, CAPILLARY
Glucose-Capillary: 95 mg/dL (ref 70–99)
Glucose-Capillary: 194 mg/dL — ABNORMAL HIGH (ref 70–99)
Glucose-Capillary: 73 mg/dL (ref 70–99)
Glucose-Capillary: 231 mg/dL — ABNORMAL HIGH (ref 70–99)

## 2021-02-17 LAB — TYPE AND SCREEN
ABO/RH(D): B POS
Antibody Screen: NEGATIVE
Unit division: 0

## 2021-02-17 LAB — MAGNESIUM: Magnesium: 1.7 mg/dL (ref 1.7–2.4)

## 2021-02-17 NOTE — Progress Notes (Signed)
Progress Note    John Ware   DEY:[id number]  DOB: [birth year]  DOA: 01/31/2021     17  PCP: Sharion Balloon, FNP  CC: Weakness, ongoing pain  Hospital Course: John Ware is a 55 y.o. male with medical history significant of chronic pain, HTN, DM2.  He had at home positive COVID test on 01/12/2021.  He had no respiratory symptoms at this time but he did have fevers that was responsive to Tylenol.  Reportedly he apparently fell out of his chair and hurt his left shoulder and he was evaluated by the ED and they found no fracture and he was given a sling and told to follow-up with orthopedics.  Since that time the patient's wife reports that he has had general body aches and pain and has increased symptoms that are not responsive to his chronic pain meds.  He is also had poor appetite during this time and has been unable to walk for last few days.  Because of these constantly some symptoms he is brought to the ED for further evaluation.  In the ED is found to have a sodium of 113 and a glucose of 339 and a WBC of 20.5 as well as a lactic acid of 3.8.  He started on broad-spectrum antibiotics and given fluids.  Soon after his blood cultures became positive for 4-4 MSSA.  He was transitioned to IV Ancef.  With further work-up and confirmation he was also found to have a large fluid collection of left lateral posterior chest and back.  He did have tenderness in his back.  He underwent MRI T-spine and CT of the chest abdomen pelvis.  There is no evidence of any osteomyelitis or discitis or other underlying infection on MRI T-spine.  CT of the chest did show a large multiloculated fluid collection.  IR was consulted and he underwent an ultrasound-guided aspiration was sent for culturing.  CT was concerning for evidence of septic emboli but 2D echo did not reveal any evidence of an endocarditis.  Cardiology was consulted for TEE per ID recommendations and he underwent TEE on 02/05/2021 which found a  large tricuspid valve vegetation with mild tricuspid regurg.  Subsequently he still complained of some shoulder pain so he underwent bilateral MRIs of his shoulders and he is still having fevers.  At the left shoulder was concerned for septic arthritis and left axillary abscess.  Orthopedics was reconsulted as well as IR and the orthopedic surgeon is planning to do a washout tomorrow in edition IR is going to place a drain on the residual fluid collection that may exist after washout.  Because of the surgery ID changed the patient to nafcillin however recommending continuing cefazolin at this time. ESR and CRP and both are elevated.   Patient underwent on 02/08/21 left shoulder excisional debridement, deep abscess with arthrotomy and irrigation debridement of the joint with biceps tenotomy and excision of the necrotic rotator cuff.  Patient is not medically ready to be D/C'd to CIR.   Postoperatively he had a CT scan and repeated and showed a marked increase in size of the abscess collection at the left lateral chest wall with persistent abscess again seen in the subscapular extending cranial to the apex of the left hemithorax.  Since he is post I&D it was unclear about how much of the increase in size of the abscess was due to the postsurgical changes progression of the abscess collection.  Patient did have some small bilateral pleural  effusions and bibasilar atelectasis as well as reactive left axillary adenopathy.  Post surgery patient was given Decadron 10 mg last night.  Because of the changes on his repeat CT scan with a left chest wall abscess with gas on CT General surgery was consulted and they are planning on taking the patient back to the OR to address his right shoulder along with a large left lateral chest wall abscess and this was done 6/18.  ID recommends continuing IV cefazolin currently  Interval History:  No events overnight.  Pain still controlled better this morning while he is on the  Dilaudid PCA.  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   Hyponatremia - Presumed due to dehydration and hypokalemia - severely low on admission, 113 - stabilizing out -BMP daily  Bacteremia due to Staphylococcus aureus-resolved as of 02/14/2021 - 4/4 blood cultures on admission growing MSSA -Repeated blood cultures on 02/01/2021; NGTD - TTE negative for vegetations; cardiology consulted for TEE, 02/05/21: POSITIVE for TV vegetation - now s/p IR drainage of left posterior chest wall fluid collection which was noted to be purulent in nature; culture also growing MSSA - MRI T spine 02/01/21 negative for OM/discitis  - MRI brain 6/12 negative for pathology in visual cortex to explain "floaters" in visual fields; seen by ophtho, negative for endophthalmitis - continue ancef - ID following  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 Since patient is post I & D of the chest wall abscess, it  is uncertain how much of the increase in size of the abscess is due to postsurgical changes versus progression of abscess collection   Small BILATERAL pleural effusions and bibasilar atelectasis. Reactive LEFT axillary adenopathy." -General surgery evaluated and patient does have a large 17 x 16 x 6 cm fluid collection in the left lateral chest wall that does not have any communication to the actual thorax cavity.  General surgery feel this needs to be drained and he was taken to the OR by Dr. Redmond Pulling 02/10/21. -He is s/p "Incision and drainage of multiple left lateral chest wall abscesses along with excisional debridement of left lateral chest wall skin, soft tissue, muscle, and fascia and axilla." -Surgery did dressing change this AM on 02/12/21 he had his Dressing Changes done. Cx showing Abundant Staph Aureus but Sensitivities pending and likely MSSA. Surgery recommending no further debridement at this time and start BID Dressing changes (Wet to Dry) -Cx Showing Abundant Staphylococcus Aures that is Resistant to Ciprofloxacin and Erythromycin -As Above Wound Vac now in place and [facility] nurse consulted. BID Wet  to Dry Dressing Changes and Orthopedic Surgery repeating Scans of shoulders and chest  - s/p left supraclavicular IR abscess drainage on 6/23  Right shoulder pain - s/p Right shoulder excisional debridement, including biceps tenolysis. Evacuation of purulent abscess, right subacromial space, as well as subcoracoid bursa. Right shoulder arthrotomy with irrigation and debridement of joint.  Right shoulder open distal clavicle resection" - ortho following for repeat washout or imaging  Left shoulder pain - See work-up above - Was previously seen in the ER after his fall and recommended for a sling with outpatient Ortho follow-up -Bilateral shoulder x-rays unremarkable but MRI left shoulder shows large abscess in left axilla; partial width tear of anterior supraspinatus, high-grade partial-thickness  articular surface tear of the posterior supraspinatus, high-grade partial-thickness articular surface tear of superior subscapularis, complete tear of biceps long head tendon within the bicipital groove - now s/p left shoulder excisional debridement, deep abscess, with arthrotomy and irrigation and debridement of the joint with biceps tenotomy and excision of necrotic rotator cuff with ortho surgery -Repeat left shoulder CT on 02/13/2021 shows persistent abscess along left chest wall/axilla - plan for repeat imaging next week with ortho and possibly more washout in OR  Vision changes - Patient now stating on 02/04/2021 that he has been seeing "black floaters" in his field of vision for several days.  When questioned why he did not bring this up sooner, he did not have a good answer and did not want to "trouble [place]" - Etiology unclear but concern would be for septic emboli given CT chest also concerning for septic emboli to the lungs - MRI brain ordered: punctate acute or subacute infarct vs artifact in high left frontal lobe. This would not affect his vision as he is noting.  -Evaluated by ophthalmology, 02/05/2021.  No endophthalmitis  DKA (diabetic ketoacidosis) (HCC)-resolved as of 02/02/2021 - present on admission - responded to fluids and insulin - now resolved  Thrombocytosis - Considered in setting of underlying infection - Follow CBC  Normocytic anemia - Continue monitoring CBC  Obesity -Complicates overall prognosis and care -Body mass index is 30.78 kg/m. -Weight Loss and Dietary Counseling given  Ascending aorta dilatation (HCC) - TTE on 6/10 notes mild dilation of ascending aorta, 38 mm - follow TEE results as well: normal thoracic and ascending aorta noted on 02/05/21  Anxiety - Wellbutrin and Zoloft on hold for now in setting of hyponatremia  HTN (hypertension) - Pressure normal on admission.  Will treat if needed  Diabetes mellitus ([place]) - Continue SSI and CBG  monitoring -Continue Lantus - A1c 10.2% on 01/31/2021  Right knee pain -Obtained X-Ray and showed "Medial joint space narrowing is noted with mild osteophytic change. Mild patellofemoral changes are noted as well. No joint effusion is seen. No acute fracture is noted."  Hyperkalemia-resolved as of 02/14/2021 - Noted earlier in the admission and treated with [facility] - Follow BMP   Old records reviewed in assessment of this patient  Antimicrobials: Cefepime 01/31/21 x 1 Rocephin 01/31/21 x 1 Vanc 01/31/21 x 1 Ancef 02/01/21 >> current  DVT prophylaxis: enoxaparin (LOVENOX) injection 40 mg Start: 02/11/21 1000 Sequential compression device to OR Start: 02/10/21 1413 SCDs Start: 02/08/21 2116   Code Status:   Code Status: Full Code Family Communication:   Disposition Plan: Status is: Inpatient  Remains inpatient appropriate because:Ongoing diagnostic testing needed not appropriate for outpatient work up, IV treatments appropriate due to intensity of illness or inability to take PO, and Inpatient level of care  appropriate due to severity of illness  Dispo: The patient is from: Home              Anticipated d/c is to:  Possibly CIR              Patient currently is not medically stable to d/c.   Difficult to place patient No  Risk of unplanned readmission score: Unplanned Admission- Pilot do not use: 24.12   Objective: Blood pressure 135/87, pulse 91, temperature 99.5 F (37.5 C), temperature source Oral, resp. rate 18, height _0  (1.778 m), weight 97.3 kg, SpO2 98 %.  Examination: General appearance: alert, cooperative, fatigued, and no distress Head: Normocephalic, without obvious abnormality, atraumatic Eyes:  EOMI; gross field of vision is intact Back:  Improved swelling Lungs: clear to auscultation bilaterally Chest wall:  Bilateral bandages noted over previously drained abscesses Back: B/L paraspinal drains in place, bulbs noted with clear/purulent drainage Heart: regular  rate and rhythm, S1, S2 normal, and unable to appreciate any murmurs Abdomen: normal findings: bowel sounds normal and soft, non-tender Extremities: 1-2+ edema Skin: mobility and turgor normal Neurologic: Grossly normal  Consultants:  ID Orthopedic surgery General surgery  Procedures:  IR drainage of back fluid collection, 6/10  Data Reviewed: I have personally reviewed following labs and imaging studies Results for orders placed or performed during the hospital encounter of 01/31/21 (from the past 24 hour(s))  Glucose, capillary     Status: Abnormal   Collection Time: 02/16/21  4:49 PM  Result Value Ref Range   Glucose-Capillary 131 (H) 70 - 99 mg/dL  Glucose, capillary     Status: Abnormal   Collection Time: 02/16/21 10:52 PM  Result Value Ref Range   Glucose-Capillary 123 (H) 70 - 99 mg/dL  CBC with Differential/Platelet     Status: Abnormal   Collection Time: 02/17/21  5:46 AM  Result Value Ref Range   WBC 9.6 4.0 - 10.5 K/uL   RBC 2.50 (L) 4.22 - 5.81 MIL/uL   Hemoglobin 7.5 (L) 13.0 - 17.0 g/dL   HCT 23.1 (L) 39.0 - 52.0 %   MCV 92.4 80.0 - 100.0 fL   MCH 30.0 26.0 - 34.0 pg   MCHC 32.5 30.0 - 36.0 g/dL   RDW 15.2 11.5 - 15.5 %   Platelets 573 (H) 150 - 400 K/uL   nRBC 0.0 0.0 - 0.2 %   Neutrophils Relative % 73 %   Neutro Abs 7.0 1.7 - 7.7 K/uL   Lymphocytes Relative 17 %   Lymphs Abs 1.6 0.7 - 4.0 K/uL   Monocytes Relative 8 %   Monocytes Absolute 0.8 0.1 - 1.0 K/uL   Eosinophils Relative 1 %   Eosinophils Absolute 0.1 0.0 - 0.5 K/uL   Basophils Relative 0 %   Basophils Absolute 0.0 0.0 - 0.1 K/uL   Immature Granulocytes 1 %   Abs Immature Granulocytes 0.06 0.00 - 0.07 K/uL  Basic metabolic panel     Status: Abnormal   Collection Time: 02/17/21  5:46 AM  Result Value Ref Range   Sodium 129 (L) 135 - 145 mmol/L   Potassium 4.3 3.5 - 5.1 mmol/L   Chloride 94 (L) 98 - 111 mmol/L   CO2 29 22 - 32 mmol/L   Glucose, Bld 115 (H) 70 - 99 mg/dL   BUN 6 6 - 20  mg/dL   Creatinine, Ser 0.52 (L) 0.61 - 1.24 mg/dL   Calcium 8.1 (L) 8.9 - 10.3 mg/dL   GFR, Estimated >  60 >60 mL/min   Anion gap 6 5 - 15  Magnesium     Status: None   Collection Time: 02/17/21  5:46 AM  Result Value Ref Range   Magnesium 1.7 1.7 - 2.4 mg/dL  Glucose, capillary     Status: None   Collection Time: 02/17/21  7:41 AM  Result Value Ref Range   Glucose-Capillary 95 70 - 99 mg/dL  Glucose, capillary     Status: Abnormal   Collection Time: 02/17/21 11:47 AM  Result Value Ref Range   Glucose-Capillary 194 (H) 70 - 99 mg/dL    Recent Results (from the past 240 hour(s))  Fungus Culture With Stain     Status: None (Preliminary result)   Collection Time: 02/08/21  6:48 PM   Specimen: Synovial, Left Shoulder; Body Fluid  Result Value Ref Range Status   Fungus Stain Final report  Final    Comment: (NOTE) Performed At: [facility] [address], [place] [id number] Rush Farmer MD [id number]    Fungus (Mycology) Culture PENDING  Incomplete   Fungal Source SHOULDER  Final    Comment: LEFT Performed at [facility], [place] [address], [facility] [employer], [place] [address]   Aerobic/Anaerobic Culture w Gram Stain (surgical/deep wound)     Status: None   Collection Time: 02/08/21  6:48 PM   Specimen: Synovial, Left Shoulder; Body Fluid  Result Value Ref Range Status   Specimen Description   Final    SHOULDER LEFT Performed at [place] [address] [place], [place] [address]    Special Requests   Final    NONE Performed at [facility], [place] [address], [place], [place] [address]    Gram Stain   Final    ABUNDANT WBC PRESENT,BOTH PMN AND MONONUCLEAR RARE GRAM POSITIVE COCCI IN PAIRS    Culture   Final    RARE STAPHYLOCOCCUS AUREUS NO ANAEROBES ISOLATED Performed at [facility], [place] [address] [place],  [address]    Report Status 02/14/2021 FINAL  Final   Organism ID, Bacteria  STAPHYLOCOCCUS AUREUS  Final      Susceptibility   Staphylococcus aureus - MIC*    CIPROFLOXACIN >=8 RESISTANT Resistant     ERYTHROMYCIN >=8 RESISTANT Resistant     GENTAMICIN <=0.5 SENSITIVE Sensitive     OXACILLIN 0.5 SENSITIVE Sensitive     TETRACYCLINE <=1 SENSITIVE Sensitive     VANCOMYCIN 1 SENSITIVE Sensitive     TRIMETH/SULFA <=10 SENSITIVE Sensitive     CLINDAMYCIN <=0.25 SENSITIVE Sensitive     RIFAMPIN <=0.5 SENSITIVE Sensitive     Inducible Clindamycin NEGATIVE Sensitive     * RARE STAPHYLOCOCCUS AUREUS  Fungus Culture Result     Status: None   Collection Time: 02/08/21  6:48 PM  Result Value Ref Range Status   Result 1 Comment  Final    Comment: (NOTE) KOH/Calcofluor preparation:  no fungus observed. Performed At: [facility] [place], [place] [id number] Rush Farmer MD [id number]:[id number]   Fungus Culture With Stain     Status: None (Preliminary result)   Collection Time: 02/08/21  6:52 PM   Specimen: Joint, Other; Body Fluid  Result Value Ref Range Status   Fungus Stain Final report  Final    Comment: (NOTE) Performed At: [facility] [place], [place] [id number] Rush Farmer MD [id number]:[id number]    Fungus (Mycology) Culture PENDING  Incomplete   Fungal Source SHOULDER  Final  Comment: LEFT BONE CHIPS Performed at [place] [address] [place], [place] [address]   Aerobic/Anaerobic Culture w Gram Stain (surgical/deep wound)     Status: None   Collection Time: 02/08/21  6:52 PM   Specimen: Joint, Other; Body Fluid  Result Value Ref Range Status   Specimen Description   Final    SHOULDER LEFT BONE CHIPS Performed at [place] [address] [place], [place] [address]    Special Requests   Final    NONE Performed at [facility], [place] [address] [place] [address] [place], [place] [address]    Gram Stain NO WBC SEEN NO ORGANISMS SEEN   Final   Culture    Final    RARE STAPHYLOCOCCUS AUREUS NO ANAEROBES ISOLATED Performed at [facility], [place] [address] [place], [place] [address]    Report Status 02/14/2021 FINAL  Final   Organism ID, Bacteria STAPHYLOCOCCUS AUREUS  Final      Susceptibility   Staphylococcus aureus - MIC*    CIPROFLOXACIN >=8 RESISTANT Resistant     ERYTHROMYCIN >=8 RESISTANT Resistant     GENTAMICIN <=0.5 SENSITIVE Sensitive     OXACILLIN 0.5 SENSITIVE Sensitive     TETRACYCLINE <=1 SENSITIVE Sensitive     VANCOMYCIN 1 SENSITIVE Sensitive     TRIMETH/SULFA <=10 SENSITIVE Sensitive     CLINDAMYCIN <=0.25 SENSITIVE Sensitive     RIFAMPIN <=0.5 SENSITIVE Sensitive     Inducible Clindamycin NEGATIVE Sensitive     * RARE STAPHYLOCOCCUS AUREUS  Fungus Culture Result     Status: None   Collection Time: 02/08/21  6:52 PM  Result Value Ref Range Status   Result 1 Comment  Final    Comment: (NOTE) KOH/Calcofluor preparation:  no fungus observed. Performed At: [facility] [place], [place] [id number] Rush Farmer MD [id number]:[id number]   Aerobic/Anaerobic Culture w Gram Stain (surgical/deep wound)     Status: None   Collection Time: 02/10/21 10:22 AM   Specimen: Wound  Result Value Ref Range Status   Specimen Description   Final    WOUND CHEST Performed at [place] [address] [place], [place] [address]    Special Requests   Final    NONE Performed at [facility], [place] [address], [place], [place] [address]    Gram Stain   Final    ABUNDANT WBC PRESENT,BOTH PMN AND MONONUCLEAR MODERATE GRAM POSITIVE COCCI    Culture   Final    ABUNDANT STAPHYLOCOCCUS AUREUS NO ANAEROBES ISOLATED Performed at [facility], [place] [address] [place], [place] [address]    Report Status 02/15/2021 FINAL  Final   Organism ID, Bacteria STAPHYLOCOCCUS AUREUS  Final      Susceptibility   Staphylococcus aureus - MIC*    CIPROFLOXACIN >=8 RESISTANT  Resistant     ERYTHROMYCIN >=8 RESISTANT Resistant     GENTAMICIN <=0.5 SENSITIVE Sensitive     OXACILLIN 0.5 SENSITIVE Sensitive     TETRACYCLINE <=1 SENSITIVE Sensitive     VANCOMYCIN 1 SENSITIVE Sensitive     TRIMETH/SULFA <=10 SENSITIVE Sensitive     CLINDAMYCIN <=0.25 SENSITIVE Sensitive     RIFAMPIN <=0.5 SENSITIVE Sensitive     Inducible Clindamycin NEGATIVE Sensitive     * ABUNDANT STAPHYLOCOCCUS AUREUS  Culture, blood (single)     Status: None (Preliminary result)   Collection Time: 02/13/21  5:51 AM   Specimen: BLOOD  Result Value Ref  Range Status   Specimen Description   Final    BLOOD RIGHT ANTECUBITAL Performed at [place] [address], [place], [place] [address]    Special Requests   Final    BOTTLES DRAWN AEROBIC ONLY Blood Culture adequate volume Performed at [place] [address], [place], [place] [address]    Culture   Final    NO GROWTH 4 DAYS Performed at [facility], [place] [address], [place], [place] [address]    Report Status PENDING  Incomplete  Culture, blood (single)     Status: None (Preliminary result)   Collection Time: 02/14/21  2:12 AM   Specimen: BLOOD  Result Value Ref Range Status   Specimen Description   Final    BLOOD RIGHT ANTECUBITAL Performed at [place] [address] [place], [place] [address]    Special Requests   Final    BOTTLES DRAWN AEROBIC AND ANAEROBIC Blood Culture adequate volume Performed at [place] [address] [place], [place] [address]    Culture   Final    NO GROWTH 3 DAYS Performed at [facility], [place] [address] [place], [place] [address]    Report Status PENDING  Incomplete  Aerobic/Anaerobic Culture w Gram Stain (surgical/deep wound)     Status: None (Preliminary result)   Collection Time: 02/15/21  5:02 PM   Specimen: Abscess  Result Value Ref Range Status   Specimen Description   Final     ABSCESS LEFT SHOULDER Performed at [place] [address] [place], [place] [address]    Special Requests   Final    NONE Performed at [facility], [place] [address], [place], [place] [address]    Gram Stain   Final    NO WBC SEEN NO ORGANISMS SEEN Performed at [facility], [place] [address], [place], [place] [address]    Culture PENDING  Incomplete   Report Status PENDING  Incomplete  Aerobic/Anaerobic Culture w Gram Stain (surgical/deep wound)     Status: None (Preliminary result)   Collection Time: 02/15/21  5:06 PM   Specimen: Abscess  Result Value Ref Range Status   Specimen Description   Final    ABSCESS POST US GUIDED LEFT SUPRAVICULAR ABSCESS DRAIN [place] Performed at [facility], [place] [address], [place], [place] [address]    Special Requests   Final    NONE Performed at [facility], [place] [address] [place], [place] [address]    Gram Stain   Final    FEW WBC PRESENT,BOTH PMN AND MONONUCLEAR FEW GRAM POSITIVE COCCI IN PAIRS Performed at [facility], [place] [address], [place], [place] [address]    Culture PENDING  Incomplete   Report Status PENDING  Incomplete     Radiology Studies: CT IMAGE GUIDED DRAINAGE BY PERCUTANEOUS CATHETER  Result Date: 02/16/2021 INDICATION: History of disseminated MSSA infection, now with bilateral paraspinal abscesses. Please perform CT-guided aspiration and/or drainage catheter placement for infection source control purposes. EXAM: CT GUIDED RETROPERITONEAL ABSCESS DRAINAGE CATHETER PLACEMENT X2 COMPARISON:  Normal lumbar spine MRI-02/15/2021 MEDICATIONS: The patient is currently admitted to the hospital and receiving intravenous antibiotics. The antibiotics were administered within an appropriate time frame prior to the initiation of the procedure. ANESTHESIA/SEDATION: Moderate (conscious) sedation was employed during this procedure. A total of Versed  2 mg and Fentanyl 50 mcg was administered intravenously. Moderate Sedation  Time: 14 minutes. The patient's level of consciousness and vital signs were monitored continuously by radiology nursing throughout the procedure under my direct supervision. CONTRAST:  None COMPLICATIONS: None immediate. PROCEDURE: Informed written consent was obtained from the patient after a discussion of the risks, benefits and alternatives to treatment. The patient was placed prone on the CT gantry and a pre procedural CT was performed re-demonstrating the known abscess/fluid collections within the bilateral iliopsoas musculature with dominant right-sided collection measuring at least 3.5 x 3.0 cm and dominant left-sided component measuring at least 2.5 x 1.7 cm (image 37, series 2). The procedure was planned. A timeout was performed prior to the initiation of the procedure. The skin overlying the operative site was prepped and draped in the usual sterile fashion. The overlying soft tissues were anesthetized with 1% lidocaine with epinephrine. Appropriate trajectory was planned with the use of 22 gauge spinal needles 18 gauge trocar needles were advanced into both retroperitoneal abscesses/fluid collections. Small amount of purulent material was aspirated from both collections in short Amplatz wires were coiled within each collection. Appropriate positioning was confirmed with a limited CT scan. The tract was serially dilated allowing placement of a 10 French all-purpose drainage catheter bilaterally. Appropriate positioning was confirmed with a limited postprocedural CT scan. Approximately 35 cc of purulent fluid was aspirated from the right-sided iliopsoas abscess while approximately 5 cc of bloody fluid was aspirated from the left-sided iliopsoas abscess. Aspirated fluid from the right-sided collection was capped and sent to the laboratory for analysis. Both drainage catheters were connected to JP bulbs and sutured in place. Dressings  were applied. The patient tolerated the procedure well without immediate post procedural complication. IMPRESSION: 1. Successful CT guided placement of a 10 [place] all purpose drain catheter into the dominant right-sided retroperitoneal abscess with aspiration of 35 mL of purulent fluid. Samples were sent to the laboratory as requested by the ordering clinical team. 2. Successful CT-guided placement of a 10 French percutaneous drainage catheter into dominant left-sided retroperitoneal abscess with aspiration of approximately 5 cc of bloody fluid. Electronically Signed   By: Sandi Mariscal M.D.   On: 02/16/2021 07:52   CT IMAGE GUIDED DRAINAGE BY PERCUTANEOUS CATHETER  Result Date: 02/16/2021 INDICATION: History of disseminated MSSA infection, now with bilateral paraspinal abscesses. Please perform CT-guided aspiration and/or drainage catheter placement for infection source control purposes. EXAM: CT GUIDED RETROPERITONEAL ABSCESS DRAINAGE CATHETER PLACEMENT X2 COMPARISON:  Normal lumbar spine MRI-02/15/2021 MEDICATIONS: The patient is currently admitted to the hospital and receiving intravenous antibiotics. The antibiotics were administered within an appropriate time frame prior to the initiation of the procedure. ANESTHESIA/SEDATION: Moderate (conscious) sedation was employed during this procedure. A total of Versed 2 mg and Fentanyl 50 mcg was administered intravenously. Moderate Sedation Time: 14 minutes. The patient's level of consciousness and vital signs were monitored continuously by radiology nursing throughout the procedure under my direct supervision. CONTRAST:  None COMPLICATIONS: None immediate. PROCEDURE: Informed written consent was obtained from the patient after a discussion of the risks, benefits and alternatives to treatment. The patient was placed prone on the CT gantry and a pre procedural CT was performed re-demonstrating the known abscess/fluid collections within the bilateral iliopsoas  musculature with dominant right-sided collection measuring at least 3.5 x 3.0 cm and dominant left-sided component measuring at least 2.5 x 1.7 cm (image 37, series 2). The procedure was planned. A timeout was performed prior to the initiation of the procedure. The skin overlying the operative site was prepped and  draped in the usual sterile fashion. The overlying soft tissues were anesthetized with 1% lidocaine with epinephrine. Appropriate trajectory was planned with the use of 22 gauge spinal needles 18 gauge trocar needles were advanced into both retroperitoneal abscesses/fluid collections. Small amount of purulent material was aspirated from both collections in short Amplatz wires were coiled within each collection. Appropriate positioning was confirmed with a limited CT scan. The tract was serially dilated allowing placement of a 10 French all-purpose drainage catheter bilaterally. Appropriate positioning was confirmed with a limited postprocedural CT scan. Approximately 35 cc of purulent fluid was aspirated from the right-sided iliopsoas abscess while approximately 5 cc of bloody fluid was aspirated from the left-sided iliopsoas abscess. Aspirated fluid from the right-sided collection was capped and sent to the laboratory for analysis. Both drainage catheters were connected to JP bulbs and sutured in place. Dressings were applied. The patient tolerated the procedure well without immediate post procedural complication. IMPRESSION: 1. Successful CT guided placement of a 10 [place] all purpose drain catheter into the dominant right-sided retroperitoneal abscess with aspiration of 35 mL of purulent fluid. Samples were sent to the laboratory as requested by the ordering clinical team. 2. Successful CT-guided placement of a 10 French percutaneous drainage catheter into dominant left-sided retroperitoneal abscess with aspiration of approximately 5 cc of bloody fluid. Electronically Signed   By: Sandi Mariscal M.D.   On:  02/16/2021 07:52   [place] IMAGE GUIDED FLUID DRAIN BY CATHETER  Result Date: 02/15/2021 INDICATION: History of multifocal MSSA infection with indeterminate fluid collection with the left supraclavicular fossa. Please perform ultrasound-guided aspiration and/or drainage catheter placement as indicated. EXAM: ULTRASOUND-GUIDED DRAINAGE CATHETER PLACEMENT INTO ABSCESS WITHIN THE LEFT SUPRACLAVICULAR FOSSA COMPARISON:  Chest CT-02/13/2021; 02/09/2021; 02/01/2021 MEDICATIONS: The patient is currently admitted to the hospital and receiving intravenous antibiotics. The antibiotics were administered within an appropriate time frame prior to the initiation of the procedure. ANESTHESIA/SEDATION: Moderate (conscious) sedation was employed during this procedure. A total of Versed 1.5 mg and Fentanyl 50 mcg was administered intravenously. Moderate Sedation Time: 14 minutes. The patient's level of consciousness and vital signs were monitored continuously by radiology nursing throughout the procedure under my direct supervision. CONTRAST:  None COMPLICATIONS: None immediate. PROCEDURE: Informed written consent was obtained from the patient after a discussion of the risks, benefits and alternatives to treatment. Patient was positioned slightly upper right on his hospital bed and sonographic evaluation was performed of the left supraclavicular fossa demonstrating a an indeterminate fluid collection compatible with the findings seen on preceding chest CT. The procedure was planned. A timeout was performed prior to the initiation of the procedure. The left supraclavicular fossa was prepped and draped in the usual sterile fashion. The overlying soft tissues were anesthetized with 1% lidocaine with epinephrine. Appropriate trajectory was planned with the use of a 22 gauge spinal needle. An 18 gauge trocar needle was advanced into the abscess/fluid collection and a short Amplatz super stiff wire was coiled within the collection.  Appropriate positioning was confirmed with a limited CT scan. The tract was serially dilated allowing placement of a 10 [place] all-purpose drainage catheter. Appropriate positioning was confirmed with a limited postprocedural CT scan. Next, approximately 40 cc of bloody fluid was aspirated. The tube was connected to a JP bulb and sutured in place. A dressing was applied. The patient tolerated the procedure well without immediate post procedural complication. IMPRESSION: Successful ultrasound guided placement of a 10 French all purpose drain catheter into the left supraclavicular abscess with  aspiration of 40 mL of bloody fluid. Samples were sent to the laboratory as requested by the ordering clinical team. Electronically Signed   By: Sandi Mariscal M.D.   On: 02/15/2021 17:01   CT IMAGE GUIDED DRAINAGE BY PERCUTANEOUS CATHETER  Final Result    CT IMAGE GUIDED DRAINAGE BY PERCUTANEOUS CATHETER  Final Result    [place] IMAGE GUIDED FLUID DRAIN BY CATHETER  Final Result    MR CERVICAL SPINE W WO CONTRAST  Final Result    MR Lumbar Spine W Wo Contrast  Final Result    MR THORACIC SPINE W WO CONTRAST  Final Result    DG CHEST PORT 1 VIEW  Final Result    CT SHOULDER LEFT WO CONTRAST  Final Result    CT SHOULDER RIGHT WO CONTRAST  Final Result    CT CHEST WO CONTRAST  Final Result    DG Chest Port 1 View  Final Result    CT SHOULDER LEFT WO CONTRAST  Final Result    CT CHEST WO CONTRAST  Final Result    DG Knee 1-2 Views Right  Final Result    MR Shoulder Right W Wo Contrast  Final Result    MR Shoulder Left W Wo Contrast  Final Result    MR BRAIN WO CONTRAST  Final Result    IR US Guide Bx Asp/Drain  Final Result    CT CHEST ABDOMEN PELVIS W CONTRAST  Final Result    DG Shoulder Left  Final Result    DG Shoulder Right  Final Result    MR THORACIC SPINE W WO CONTRAST  Final Result    DG Chest Port 1 View  Final Result      Scheduled Meds:  acetaminophen  500  mg Oral QID   Chlorhexidine Gluconate Cloth  6 each Topical Daily   enoxaparin (LOVENOX) injection  40 mg Subcutaneous Q24H   gabapentin  800 mg Oral TID   HYDROmorphone   Intravenous Q4H   insulin aspart  0-15 Units Subcutaneous TID WC   insulin aspart  0-5 Units Subcutaneous QHS   insulin aspart  4 Units Subcutaneous TID WC   insulin glargine  15 Units Subcutaneous Daily   mouth rinse  15 mL Mouth Rinse BID   polyethylene glycol  17 g Oral Daily   povidone-iodine  2 application Topical Once   senna-docusate  1 tablet Oral BID   sodium chloride flush  5 mL Intracatheter Q8H   PRN Meds: sodium chloride, alum & mag hydroxide-simeth, diphenhydrAMINE **OR** diphenhydrAMINE, docusate sodium, lip balm, magic mouthwash w/lidocaine, methocarbamol, metoCLOPramide **OR** metoCLOPramide (REGLAN) injection, naloxone **AND** sodium chloride flush, ondansetron (ZOFRAN) IV, oxyCODONE, traZODone Continuous Infusions:  sodium chloride Stopped (02/07/21 0508)    ceFAZolin (ANCEF) IV 2 g (02/17/21 0936)     LOS: 17 days  Time spent: Greater than 50% of the 35 minute visit was spent in counseling/coordination of care for the patient as laid out in the A&P.   Late entry: patient seen and rounded on 02/16/21  Dwyane Dee, MD [facility] 02/17/2021, 1:01 PM

## 2021-02-17 NOTE — Progress Notes (Signed)
Patient ID: John Ware, male   DOB: 09-25-1965, 55 y.o.   MRN: [id number]  [facility] changed yesterday afternoon Will recheck on Monday.  Wilmon Arms. Corliss Skains, MD, [facility] Surgery  General/ Trauma Surgery   02/17/2021 9:08 AM

## 2021-02-17 NOTE — Progress Notes (Signed)
Progress Note    John Ware   DEY:[id number]  DOB: [birth year]  DOA: 01/31/2021     17  PCP: Sharion Balloon, FNP  CC: Weakness, ongoing pain  Hospital Course: John Ware is a 55 y.o. male with medical history significant of chronic pain, HTN, DM2.  He had at home positive COVID test on 01/12/2021.  He had no respiratory symptoms at this time but he did have fevers that was responsive to Tylenol.  Reportedly he apparently fell out of his chair and hurt his left shoulder and he was evaluated by the ED and they found no fracture and he was given a sling and told to follow-up with orthopedics.  Since that time the patient's wife reports that he has had general body aches and pain and has increased symptoms that are not responsive to his chronic pain meds.  He is also had poor appetite during this time and has been unable to walk for last few days.  Because of these constantly some symptoms he is brought to the ED for further evaluation.  In the ED is found to have a sodium of 113 and a glucose of 339 and a WBC of 20.5 as well as a lactic acid of 3.8.  He started on broad-spectrum antibiotics and given fluids.  Soon after his blood cultures became positive for 4-4 MSSA.  He was transitioned to IV Ancef.  With further work-up and confirmation he was also found to have a large fluid collection of left lateral posterior chest and back.  He did have tenderness in his back.  He underwent MRI T-spine and CT of the chest abdomen pelvis.  There is no evidence of any osteomyelitis or discitis or other underlying infection on MRI T-spine.  CT of the chest did show a large multiloculated fluid collection.  IR was consulted and he underwent an ultrasound-guided aspiration was sent for culturing.  CT was concerning for evidence of septic emboli but 2D echo did not reveal any evidence of an endocarditis.  Cardiology was consulted for TEE per ID recommendations and he underwent TEE on 02/05/2021 which found a  large tricuspid valve vegetation with mild tricuspid regurg.  Subsequently he still complained of some shoulder pain so he underwent bilateral MRIs of his shoulders and he is still having fevers.  At the left shoulder was concerned for septic arthritis and left axillary abscess.  Orthopedics was reconsulted as well as IR and the orthopedic surgeon is planning to do a washout tomorrow in edition IR is going to place a drain on the residual fluid collection that may exist after washout.  Because of the surgery ID changed the patient to nafcillin however recommending continuing cefazolin at this time. ESR and CRP and both are elevated.   Patient underwent on 02/08/21 left shoulder excisional debridement, deep abscess with arthrotomy and irrigation debridement of the joint with biceps tenotomy and excision of the necrotic rotator cuff.  Patient is not medically ready to be D/C'd to CIR.   Postoperatively he had a CT scan and repeated and showed a marked increase in size of the abscess collection at the left lateral chest wall with persistent abscess again seen in the subscapular extending cranial to the apex of the left hemithorax.  Since he is post I&D it was unclear about how much of the increase in size of the abscess was due to the postsurgical changes progression of the abscess collection.  Patient did have some small bilateral pleural  effusions and bibasilar atelectasis as well as reactive left axillary adenopathy.  Post surgery patient was given Decadron 10 mg last night.  Because of the changes on his repeat CT scan with a left chest wall abscess with gas on CT General surgery was consulted and they are planning on taking the patient back to the OR to address his right shoulder along with a large left lateral chest wall abscess and this was done 6/18.  ID recommends continuing IV cefazolin currently  Interval History:  Significant amount of pain this morning, see nursing notes.  He was placed on a Dilaudid  PCA by surgery which seems to have improved his pain when I saw him in the afternoon.  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   Hyponatremia - Presumed due to dehydration and hypokalemia - severely low on admission, 113 - stabilizing out -BMP daily  Bacteremia due to Staphylococcus aureus-resolved as of 02/14/2021 - 4/4 blood cultures on admission growing MSSA -Repeated blood cultures on 02/01/2021; NGTD - TTE negative for vegetations; cardiology consulted for TEE, 02/05/21: POSITIVE for TV vegetation - now s/p IR drainage of left posterior chest wall fluid collection which was noted to be purulent in nature; culture also growing MSSA - MRI T spine 02/01/21 negative for OM/discitis  - MRI brain 6/12 negative for pathology in visual cortex to explain "floaters" in visual fields; seen by ophtho, negative for endophthalmitis - continue ancef - ID following  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 Since patient is post I & D of the chest wall abscess, it is uncertain how much of the increase in size of the abscess is due to postsurgical changes versus progression of abscess collection   Small BILATERAL pleural effusions and bibasilar atelectasis. Reactive LEFT axillary adenopathy." -General surgery evaluated and patient does have a large 17 x 16 x 6 cm fluid collection in the left lateral chest wall that does not have any communication to the actual thorax cavity.  General surgery feel this needs to be drained and he was taken to the OR by Dr. Redmond Pulling 02/10/21. -He is s/p "Incision and drainage of multiple left lateral chest wall abscesses along with excisional debridement of left lateral chest wall skin, soft tissue, muscle, and fascia and axilla." -Surgery did dressing change this AM on 02/12/21 he had his Dressing Changes done. Cx showing Abundant Staph Aureus but Sensitivities pending and likely MSSA. Surgery recommending no further debridement at this time and start BID Dressing changes (Wet to Dry) -Cx Showing Abundant Staphylococcus Aures that is Resistant to  Ciprofloxacin and Erythromycin -As Above Wound Vac now in place and [place] nurse consulted. BID Wet to Dry Dressing Changes and Orthopedic Surgery repeating Scans of shoulders and chest  - s/p left supraclavicular IR abscess drainage on 6/23  Right shoulder pain - s/p Right shoulder excisional debridement, including biceps tenolysis. Evacuation of purulent abscess, right subacromial space, as well as subcoracoid bursa. Right shoulder arthrotomy with irrigation and debridement of joint.  Right shoulder open distal clavicle resection" - ortho following for repeat washout or imaging  Left shoulder pain - See work-up above - Was previously seen in the ER after his fall and recommended for a sling with outpatient Ortho follow-up -Bilateral shoulder x-rays unremarkable but MRI left shoulder shows large abscess in left axilla;  partial width tear of anterior supraspinatus, high-grade partial-thickness articular surface tear of the posterior supraspinatus, high-grade partial-thickness articular surface tear of superior subscapularis, complete tear of biceps long head tendon within the bicipital groove - now s/p left shoulder excisional debridement, deep abscess, with arthrotomy and irrigation and debridement of the joint with biceps tenotomy and excision of necrotic rotator cuff with ortho surgery -Repeat left shoulder CT on 02/13/2021 shows persistent abscess along left chest wall/axilla - plan for repeat imaging next week with ortho and possibly more washout in OR  Vision changes - Patient now stating on 02/04/2021 that he has been seeing "black floaters" in his field of vision for several days.  When questioned why he did not bring this up sooner, he did not have a good answer and did not want to "trouble [place]" - Etiology unclear but concern would be for septic emboli given CT chest also concerning for septic emboli to the lungs - MRI brain ordered: punctate acute or subacute infarct vs artifact in high left frontal lobe. This would not affect his vision as he is noting.  -Evaluated by ophthalmology, 02/05/2021.  No endophthalmitis  DKA (diabetic ketoacidosis) (HCC)-resolved as of 02/02/2021 - present on admission - responded to fluids and insulin - now resolved  Thrombocytosis - Considered in setting of underlying infection - Follow CBC  Normocytic anemia - Continue monitoring CBC  Obesity -Complicates overall prognosis and care -Body mass index is 30.78 kg/m. -Weight Loss and Dietary Counseling given  Ascending aorta dilatation (HCC) - TTE on 6/10 notes mild dilation of ascending aorta, 38 mm - follow TEE results as well: normal thoracic and ascending aorta noted on 02/05/21  Anxiety - Wellbutrin and Zoloft on hold for now in setting of hyponatremia  HTN (hypertension) - Pressure normal on admission.  Will  treat if needed  Diabetes mellitus ([place]) - Continue SSI and CBG monitoring -Continue Lantus - A1c 10.2% on 01/31/2021  Right knee pain -Obtained X-Ray and showed "Medial joint space narrowing is noted with mild osteophytic change. Mild patellofemoral changes are noted as well. No joint effusion is seen. No acute fracture is noted."  Hyperkalemia-resolved as of 02/14/2021 - Noted earlier in the admission and treated with [facility] - Follow BMP   Old records reviewed in assessment of this patient  Antimicrobials: Cefepime 01/31/21 x 1 Rocephin 01/31/21 x 1 Vanc 01/31/21 x 1 Ancef 02/01/21 >> current  DVT prophylaxis: enoxaparin (LOVENOX) injection 40 mg Start: 02/11/21 1000 Sequential compression device to OR Start: 02/10/21 1413 SCDs Start: 02/08/21 2116   Code Status:   Code Status: Full Code Family Communication:   Disposition Plan: Status is: Inpatient  Remains inpatient appropriate because:Ongoing diagnostic testing needed not appropriate for outpatient work up, IV treatments  appropriate due to intensity of illness or inability to take PO, and Inpatient level of care appropriate due to severity of illness  Dispo: The patient is from: Home              Anticipated d/c is to:  Possibly CIR              Patient currently is not medically stable to d/c.   Difficult to place patient No  Risk of unplanned readmission score: Unplanned Admission- Pilot do not use: 24.02   Objective: Blood pressure 135/87, pulse 91, temperature 99.5 F (37.5 C), temperature source Oral, resp. rate 20, height $RemoveBe'5\' 10"'TFTPtOPyd$  (1.778 m), weight 97.3 kg, SpO2 96 %.  Examination: General appearance: alert, cooperative, fatigued, and no distress Head: Normocephalic, without obvious abnormality, atraumatic Eyes:  EOMI; gross field of vision is intact Back:  Improved swelling Lungs: clear to auscultation bilaterally Chest wall:  Bilateral bandages noted over previously drained abscesses Back: B/L paraspinal drains  in place, bulbs noted with clear/purulent drainage Heart: regular rate and rhythm, S1, S2 normal, and unable to appreciate any murmurs Abdomen: normal findings: bowel sounds normal and soft, non-tender Extremities: 1-2+ edema Skin: mobility and turgor normal Neurologic: Grossly normal  Consultants:  ID Orthopedic surgery General surgery  Procedures:  IR drainage of back fluid collection, 6/10  Data Reviewed: I have personally reviewed following labs and imaging studies Results for orders placed or performed during the hospital encounter of 01/31/21 (from the past 24 hour(s))  Glucose, capillary     Status: Abnormal   Collection Time: 02/16/21  7:48 AM  Result Value Ref Range   Glucose-Capillary 141 (H) 70 - 99 mg/dL  Type and screen [place]     Status: None (Preliminary result)   Collection Time: 02/16/21  8:50 AM  Result Value Ref Range   ABO/RH(D) B POS    Antibody Screen NEG    Sample Expiration 02/19/2021,2359    Unit Number [id number]    Blood Component Type RED CELLS,LR    Unit division 00    Status of Unit ISSUED    Transfusion Status OK TO TRANSFUSE    Crossmatch Result      Compatible Performed at [facility], [place] [address], [place], [place] [address]   Prepare RBC (crossmatch)     Status: None   Collection Time: 02/16/21  8:50 AM  Result Value Ref Range   Order Confirmation      ORDER PROCESSED BY BLOOD BANK Performed at [facility], [place] [address] [place], [place] [address]   Glucose, capillary     Status: Abnormal   Collection Time: 02/16/21 11:43 AM  Result Value Ref Range   Glucose-Capillary 174 (H) 70 - 99 mg/dL  Glucose, capillary     Status: Abnormal   Collection Time: 02/16/21  4:49 PM  Result Value Ref Range   Glucose-Capillary 131 (H) 70 - 99 mg/dL  Glucose, capillary     Status: Abnormal   Collection Time: 02/16/21 10:52 PM  Result Value Ref Range   Glucose-Capillary 123 (H)  70 - 99 mg/dL    Recent Results (from the past 240 hour(s))  Fungus Culture With Stain     Status: None (Preliminary result)   Collection Time: 02/08/21  6:48 PM   Specimen: Synovial, Left Shoulder; Body Fluid  Result Value Ref Range Status   Fungus Stain Final report  Final    Comment: (NOTE) Performed At: [facility] [place]  [place], [place] [id number] Rush Farmer MD [id number]    Fungus (Mycology) Culture PENDING  Incomplete   Fungal Source SHOULDER  Final    Comment: LEFT Performed at [place] [address] [place], [place] [address]   Aerobic/Anaerobic Culture w Gram Stain (surgical/deep wound)     Status: None   Collection Time: 02/08/21  6:48 PM   Specimen: Synovial, Left Shoulder; Body Fluid  Result Value Ref Range Status   Specimen Description   Final    SHOULDER LEFT Performed at [place] [address] [place], [place] [address]    Special Requests   Final    NONE Performed at [facility], [place] [address], [place], [place] [address]    Gram Stain   Final    ABUNDANT WBC PRESENT,BOTH PMN AND MONONUCLEAR RARE GRAM POSITIVE COCCI IN PAIRS    Culture   Final    RARE STAPHYLOCOCCUS AUREUS NO ANAEROBES ISOLATED Performed at [facility], [place] [address] [place], [place] [address]    Report Status 02/14/2021 FINAL  Final   Organism ID, Bacteria STAPHYLOCOCCUS AUREUS  Final      Susceptibility   Staphylococcus aureus - MIC*    CIPROFLOXACIN >=8 RESISTANT Resistant     ERYTHROMYCIN >=8 RESISTANT Resistant     GENTAMICIN <=0.5 SENSITIVE Sensitive     OXACILLIN 0.5 SENSITIVE Sensitive     TETRACYCLINE <=1 SENSITIVE Sensitive     VANCOMYCIN 1 SENSITIVE Sensitive     TRIMETH/SULFA <=10 SENSITIVE Sensitive     CLINDAMYCIN <=0.25 SENSITIVE Sensitive     RIFAMPIN <=0.5 SENSITIVE Sensitive     Inducible Clindamycin NEGATIVE Sensitive     * RARE STAPHYLOCOCCUS AUREUS   Fungus Culture Result     Status: None   Collection Time: 02/08/21  6:48 PM  Result Value Ref Range Status   Result 1 Comment  Final    Comment: (NOTE) KOH/Calcofluor preparation:  no fungus observed. Performed At: [facility] [place], [place] [id number] Rush Farmer MD [id number]   Fungus Culture With Stain     Status: None (Preliminary result)   Collection Time: 02/08/21  6:52 PM   Specimen: Joint, Other; Body Fluid  Result Value Ref Range Status   Fungus Stain Final report  Final    Comment: (NOTE) Performed At: [facility] [place], [place] [id number] Rush Farmer MD [id number]    Fungus (Mycology) Culture PENDING  Incomplete   Fungal Source SHOULDER  Final    Comment: LEFT BONE CHIPS Performed at [facility], [place] [address], [place], [place] [address]   Aerobic/Anaerobic Culture w Gram Stain (surgical/deep wound)     Status: None   Collection Time: 02/08/21  6:52 PM   Specimen: Joint, Other; Body Fluid  Result Value Ref Range Status   Specimen Description   Final    SHOULDER LEFT BONE CHIPS Performed at [place] [address], [place], [place] [address]    Special Requests   Final    NONE Performed at [facility], [place], [place] [address] [place], [place] [address]    Gram Stain NO WBC SEEN NO ORGANISMS SEEN   Final   Culture   Final    RARE STAPHYLOCOCCUS AUREUS NO ANAEROBES ISOLATED Performed at [facility], [place] [address] [place],  [address]    Report Status 02/14/2021 FINAL  Final   Organism ID,  Bacteria STAPHYLOCOCCUS AUREUS  Final      Susceptibility   Staphylococcus aureus - MIC*    CIPROFLOXACIN >=8 RESISTANT Resistant     ERYTHROMYCIN >=8 RESISTANT Resistant     GENTAMICIN <=0.5 SENSITIVE Sensitive     OXACILLIN 0.5 SENSITIVE Sensitive     TETRACYCLINE <=1 SENSITIVE Sensitive     VANCOMYCIN 1 SENSITIVE Sensitive      TRIMETH/SULFA <=10 SENSITIVE Sensitive     CLINDAMYCIN <=0.25 SENSITIVE Sensitive     RIFAMPIN <=0.5 SENSITIVE Sensitive     Inducible Clindamycin NEGATIVE Sensitive     * RARE STAPHYLOCOCCUS AUREUS  Fungus Culture Result     Status: None   Collection Time: 02/08/21  6:52 PM  Result Value Ref Range Status   Result 1 Comment  Final    Comment: (NOTE) KOH/Calcofluor preparation:  no fungus observed. Performed At: [facility] [place], [place] [id number] Rush Farmer MD QQ:[id number]   Aerobic/Anaerobic Culture w Gram Stain (surgical/deep wound)     Status: None   Collection Time: 02/10/21 10:22 AM   Specimen: Wound  Result Value Ref Range Status   Specimen Description   Final    WOUND CHEST Performed at [place] [address] [place], [place] [address]    Special Requests   Final    NONE Performed at [facility], [place] [address], [place], [place] [address]    Gram Stain   Final    ABUNDANT WBC PRESENT,BOTH PMN AND MONONUCLEAR MODERATE GRAM POSITIVE COCCI    Culture   Final    ABUNDANT STAPHYLOCOCCUS AUREUS NO ANAEROBES ISOLATED Performed at [facility], [place] [address], [place], [place] [address]    Report Status 02/15/2021 FINAL  Final   Organism ID, Bacteria STAPHYLOCOCCUS AUREUS  Final      Susceptibility   Staphylococcus aureus - MIC*    CIPROFLOXACIN >=8 RESISTANT Resistant     ERYTHROMYCIN >=8 RESISTANT Resistant     GENTAMICIN <=0.5 SENSITIVE Sensitive     OXACILLIN 0.5 SENSITIVE Sensitive     TETRACYCLINE <=1 SENSITIVE Sensitive     VANCOMYCIN 1 SENSITIVE Sensitive     TRIMETH/SULFA <=10 SENSITIVE Sensitive     CLINDAMYCIN <=0.25 SENSITIVE Sensitive     RIFAMPIN <=0.5 SENSITIVE Sensitive     Inducible Clindamycin NEGATIVE Sensitive     * ABUNDANT STAPHYLOCOCCUS AUREUS  Culture, blood (single)     Status: None (Preliminary result)   Collection Time: 02/13/21  5:51 AM   Specimen: BLOOD   Result Value Ref Range Status   Specimen Description   Final    BLOOD RIGHT ANTECUBITAL Performed at [place] [address] [place], [place] [address]    Special Requests   Final    BOTTLES DRAWN AEROBIC ONLY Blood Culture adequate volume Performed at [place] [address] [place], [place] [address]    Culture   Final    NO GROWTH 3 DAYS Performed at [facility], [place] [address], [place], [place] [address]    Report Status PENDING  Incomplete  Culture, blood (single)     Status: None (Preliminary result)   Collection Time: 02/14/21  2:12 AM   Specimen: BLOOD  Result Value Ref Range Status   Specimen Description   Final    BLOOD RIGHT ANTECUBITAL Performed at [place] [address], [place], [place] [address]    Special Requests   Final  BOTTLES DRAWN AEROBIC AND ANAEROBIC Blood Culture adequate volume Performed at [place] [address] [place], [place] [address]    Culture   Final    NO GROWTH 2 DAYS Performed at [place] [address] [place], [place] [address]    Report Status PENDING  Incomplete  Aerobic/Anaerobic Culture w Gram Stain (surgical/deep wound)     Status: None (Preliminary result)   Collection Time: 02/15/21  5:02 PM   Specimen: Abscess  Result Value Ref Range Status   Specimen Description   Final    ABSCESS LEFT SHOULDER Performed at [place] [address] [place], [place] [address]    Special Requests   Final    NONE Performed at [facility], [place] [address], [place], [place] [address]    Gram Stain   Final    NO WBC SEEN NO ORGANISMS SEEN Performed at [facility], [place] [address] [place], [place] [address]    Culture PENDING  Incomplete   Report Status PENDING  Incomplete  Aerobic/Anaerobic Culture w Gram Stain (surgical/deep wound)     Status: None (Preliminary result)    Collection Time: 02/15/21  5:06 PM   Specimen: Abscess  Result Value Ref Range Status   Specimen Description   Final    ABSCESS POST US GUIDED LEFT SUPRAVICULAR ABSCESS DRAIN [place] Performed at [facility], [place] [address], [place], [place] [address]    Special Requests   Final    NONE Performed at [facility], [place] [address], [place], [place] [address]    Gram Stain   Final    FEW WBC PRESENT,BOTH PMN AND MONONUCLEAR FEW GRAM POSITIVE COCCI IN PAIRS Performed at [facility], [place] [address], [place], [place] [address]    Culture PENDING  Incomplete   Report Status PENDING  Incomplete     Radiology Studies: CT IMAGE GUIDED DRAINAGE BY PERCUTANEOUS CATHETER  Result Date: 02/16/2021 INDICATION: History of disseminated MSSA infection, now with bilateral paraspinal abscesses. Please perform CT-guided aspiration and/or drainage catheter placement for infection source control purposes. EXAM: CT GUIDED RETROPERITONEAL ABSCESS DRAINAGE CATHETER PLACEMENT X2 COMPARISON:  Normal lumbar spine MRI-02/15/2021 MEDICATIONS: The patient is currently admitted to the hospital and receiving intravenous antibiotics. The antibiotics were administered within an appropriate time frame prior to the initiation of the procedure. ANESTHESIA/SEDATION: Moderate (conscious) sedation was employed during this procedure. A total of Versed 2 mg and Fentanyl 50 mcg was administered intravenously. Moderate Sedation Time: 14 minutes. The patient's level of consciousness and vital signs were monitored continuously by radiology nursing throughout the procedure under my direct supervision. CONTRAST:  None COMPLICATIONS: None immediate. PROCEDURE: Informed written consent was obtained from the patient after a discussion of the risks, benefits and alternatives to treatment. The patient was placed prone on the CT gantry and a pre procedural CT was performed re-demonstrating the known  abscess/fluid collections within the bilateral iliopsoas musculature with dominant right-sided collection measuring at least 3.5 x 3.0 cm and dominant left-sided component measuring at least 2.5 x 1.7 cm (image 37, series 2). The procedure was planned. A timeout was performed prior to the initiation of the procedure. The skin overlying the operative site was prepped and draped in the usual sterile fashion. The overlying soft tissues were anesthetized with 1% lidocaine with epinephrine. Appropriate trajectory was planned with the use of 22 gauge spinal needles 18 gauge trocar needles were advanced into  both retroperitoneal abscesses/fluid collections. Small amount of purulent material was aspirated from both collections in short Amplatz wires were coiled within each collection. Appropriate positioning was confirmed with a limited CT scan. The tract was serially dilated allowing placement of a 10 French all-purpose drainage catheter bilaterally. Appropriate positioning was confirmed with a limited postprocedural CT scan. Approximately 35 cc of purulent fluid was aspirated from the right-sided iliopsoas abscess while approximately 5 cc of bloody fluid was aspirated from the left-sided iliopsoas abscess. Aspirated fluid from the right-sided collection was capped and sent to the laboratory for analysis. Both drainage catheters were connected to JP bulbs and sutured in place. Dressings were applied. The patient tolerated the procedure well without immediate post procedural complication. IMPRESSION: 1. Successful CT guided placement of a 10 [place] all purpose drain catheter into the dominant right-sided retroperitoneal abscess with aspiration of 35 mL of purulent fluid. Samples were sent to the laboratory as requested by the ordering clinical team. 2. Successful CT-guided placement of a 10 French percutaneous drainage catheter into dominant left-sided retroperitoneal abscess with aspiration of approximately 5 cc of bloody  fluid. Electronically Signed   By: Sandi Mariscal M.D.   On: 02/16/2021 07:52   CT IMAGE GUIDED DRAINAGE BY PERCUTANEOUS CATHETER  Result Date: 02/16/2021 INDICATION: History of disseminated MSSA infection, now with bilateral paraspinal abscesses. Please perform CT-guided aspiration and/or drainage catheter placement for infection source control purposes. EXAM: CT GUIDED RETROPERITONEAL ABSCESS DRAINAGE CATHETER PLACEMENT X2 COMPARISON:  Normal lumbar spine MRI-02/15/2021 MEDICATIONS: The patient is currently admitted to the hospital and receiving intravenous antibiotics. The antibiotics were administered within an appropriate time frame prior to the initiation of the procedure. ANESTHESIA/SEDATION: Moderate (conscious) sedation was employed during this procedure. A total of Versed 2 mg and Fentanyl 50 mcg was administered intravenously. Moderate Sedation Time: 14 minutes. The patient's level of consciousness and vital signs were monitored continuously by radiology nursing throughout the procedure under my direct supervision. CONTRAST:  None COMPLICATIONS: None immediate. PROCEDURE: Informed written consent was obtained from the patient after a discussion of the risks, benefits and alternatives to treatment. The patient was placed prone on the CT gantry and a pre procedural CT was performed re-demonstrating the known abscess/fluid collections within the bilateral iliopsoas musculature with dominant right-sided collection measuring at least 3.5 x 3.0 cm and dominant left-sided component measuring at least 2.5 x 1.7 cm (image 37, series 2). The procedure was planned. A timeout was performed prior to the initiation of the procedure. The skin overlying the operative site was prepped and draped in the usual sterile fashion. The overlying soft tissues were anesthetized with 1% lidocaine with epinephrine. Appropriate trajectory was planned with the use of 22 gauge spinal needles 18 gauge trocar needles were advanced into  both retroperitoneal abscesses/fluid collections. Small amount of purulent material was aspirated from both collections in short Amplatz wires were coiled within each collection. Appropriate positioning was confirmed with a limited CT scan. The tract was serially dilated allowing placement of a 10 French all-purpose drainage catheter bilaterally. Appropriate positioning was confirmed with a limited postprocedural CT scan. Approximately 35 cc of purulent fluid was aspirated from the right-sided iliopsoas abscess while approximately 5 cc of bloody fluid was aspirated from the left-sided iliopsoas abscess. Aspirated fluid from the right-sided collection was capped and sent to the laboratory for analysis. Both drainage catheters were connected to JP bulbs and sutured in place. Dressings were applied. The patient tolerated the procedure well without immediate post procedural complication. IMPRESSION:  1. Successful CT guided placement of a 10 French all purpose drain catheter into the dominant right-sided retroperitoneal abscess with aspiration of 35 mL of purulent fluid. Samples were sent to the laboratory as requested by the ordering clinical team. 2. Successful CT-guided placement of a 10 French percutaneous drainage catheter into dominant left-sided retroperitoneal abscess with aspiration of approximately 5 cc of bloody fluid. Electronically Signed   By: Sandi Mariscal M.D.   On: 02/16/2021 07:52   [place] IMAGE GUIDED FLUID DRAIN BY CATHETER  Result Date: 02/15/2021 INDICATION: History of multifocal MSSA infection with indeterminate fluid collection with the left supraclavicular fossa. Please perform ultrasound-guided aspiration and/or drainage catheter placement as indicated. EXAM: ULTRASOUND-GUIDED DRAINAGE CATHETER PLACEMENT INTO ABSCESS WITHIN THE LEFT SUPRACLAVICULAR FOSSA COMPARISON:  Chest CT-02/13/2021; 02/09/2021; 02/01/2021 MEDICATIONS: The patient is currently admitted to the hospital and receiving intravenous  antibiotics. The antibiotics were administered within an appropriate time frame prior to the initiation of the procedure. ANESTHESIA/SEDATION: Moderate (conscious) sedation was employed during this procedure. A total of Versed 1.5 mg and Fentanyl 50 mcg was administered intravenously. Moderate Sedation Time: 14 minutes. The patient's level of consciousness and vital signs were monitored continuously by radiology nursing throughout the procedure under my direct supervision. CONTRAST:  None COMPLICATIONS: None immediate. PROCEDURE: Informed written consent was obtained from the patient after a discussion of the risks, benefits and alternatives to treatment. Patient was positioned slightly upper right on his hospital bed and sonographic evaluation was performed of the left supraclavicular fossa demonstrating a an indeterminate fluid collection compatible with the findings seen on preceding chest CT. The procedure was planned. A timeout was performed prior to the initiation of the procedure. The left supraclavicular fossa was prepped and draped in the usual sterile fashion. The overlying soft tissues were anesthetized with 1% lidocaine with epinephrine. Appropriate trajectory was planned with the use of a 22 gauge spinal needle. An 18 gauge trocar needle was advanced into the abscess/fluid collection and a short Amplatz super stiff wire was coiled within the collection. Appropriate positioning was confirmed with a limited CT scan. The tract was serially dilated allowing placement of a 10 [place] all-purpose drainage catheter. Appropriate positioning was confirmed with a limited postprocedural CT scan. Next, approximately 40 cc of bloody fluid was aspirated. The tube was connected to a JP bulb and sutured in place. A dressing was applied. The patient tolerated the procedure well without immediate post procedural complication. IMPRESSION: Successful ultrasound guided placement of a 10 French all purpose drain catheter into  the left supraclavicular abscess with aspiration of 40 mL of bloody fluid. Samples were sent to the laboratory as requested by the ordering clinical team. Electronically Signed   By: Sandi Mariscal M.D.   On: 02/15/2021 17:01   CT IMAGE GUIDED DRAINAGE BY PERCUTANEOUS CATHETER  Final Result    CT IMAGE GUIDED DRAINAGE BY PERCUTANEOUS CATHETER  Final Result    [place] IMAGE GUIDED FLUID DRAIN BY CATHETER  Final Result    MR CERVICAL SPINE W WO CONTRAST  Final Result    MR Lumbar Spine W Wo Contrast  Final Result    MR THORACIC SPINE W WO CONTRAST  Final Result    DG CHEST PORT 1 VIEW  Final Result    CT SHOULDER LEFT WO CONTRAST  Final Result    CT SHOULDER RIGHT WO CONTRAST  Final Result    CT CHEST WO CONTRAST  Final Result    DG Chest Port 1 View  Final Result  CT SHOULDER LEFT WO CONTRAST  Final Result    CT CHEST WO CONTRAST  Final Result    DG Knee 1-2 Views Right  Final Result    MR Shoulder Right W Wo Contrast  Final Result    MR Shoulder Left W Wo Contrast  Final Result    MR BRAIN WO CONTRAST  Final Result    IR US Guide Bx Asp/Drain  Final Result    CT CHEST ABDOMEN PELVIS W CONTRAST  Final Result    DG Shoulder Left  Final Result    DG Shoulder Right  Final Result    MR THORACIC SPINE W WO CONTRAST  Final Result    DG Chest Port 1 View  Final Result      Scheduled Meds:  acetaminophen  500 mg Oral QID   Chlorhexidine Gluconate Cloth  6 each Topical Daily   enoxaparin (LOVENOX) injection  40 mg Subcutaneous Q24H   gabapentin  800 mg Oral TID   HYDROmorphone   Intravenous Q4H   insulin aspart  0-15 Units Subcutaneous TID WC   insulin aspart  0-5 Units Subcutaneous QHS   insulin aspart  4 Units Subcutaneous TID WC   insulin glargine  15 Units Subcutaneous Daily   mouth rinse  15 mL Mouth Rinse BID   polyethylene glycol  17 g Oral Daily   povidone-iodine  2 application Topical Once   senna-docusate  1 tablet Oral BID   sodium  chloride flush  5 mL Intracatheter Q8H   PRN Meds: sodium chloride, alum & mag hydroxide-simeth, diphenhydrAMINE **OR** diphenhydrAMINE, docusate sodium, lip balm, magic mouthwash w/lidocaine, methocarbamol, metoCLOPramide **OR** metoCLOPramide (REGLAN) injection, naloxone **AND** sodium chloride flush, ondansetron (ZOFRAN) IV, oxyCODONE, traZODone Continuous Infusions:  sodium chloride Stopped (02/07/21 0508)    ceFAZolin (ANCEF) IV 2 g (02/17/21 0014)     LOS: 17 days  Time spent: Greater than 50% of the 35 minute visit was spent in counseling/coordination of care for the patient as laid out in the A&P.   Late entry: patient seen and rounded on 02/16/21  Dwyane Dee, MD [facility] 02/17/2021, 7:27 AM

## 2021-02-17 NOTE — Progress Notes (Signed)
Occupational Therapy Treatment Patient Details Name: John Ware MRN: [id number] DOB: 1965-09-20 Today's Date: 02/17/2021    History of present illness Patient is a 55 y.o. male presented to ED with weakness and per wife generally unwell over the past 3 weeks. He had a positive home COVID test on 01/12/21. He had no respiratory symptoms at the time. He just had fevers that were responsive to APAP. He self isolated at home until 01/20/21 when he was at a party and experienced a fall. bil shoulder and spine xrays negative.  MRI brain=Punctate acute or subacute infarct versus artifact in the high left  frontal lobe. Otherwise, no evidence of recent infarction.  Underwent bilateral MRIs of his shoulders and he is still having fevers.  At the left shoulder was concerned for septic arthritis and left axillary abscess.  Orthopedics was reconsulted as well as IR.  Pt s/p  Left shoulder excisional debridement, deep abscess, with arthrotomy and irrigation and debridement of the joint with biceps tenotomy and excision of necrotic rotator cuff on 02/08/21. Patient s/p Right shoulder excisional debridement, including biceps tenolysis, Evacuation of purulent abscess, right subacromial space, as well as subcoracoid bursa, Right shoulder arthrotomy with irrigation and debridement of joint, Right shoulder open distal clavicle resection on 6/18.  Pt also s/p Incision and drainage of multiple left lateral chest wall abscesses along with excisional debridement on 6/18. PMH: significant of chronic pain, HTN, DM2, depression, spinal stenosis.   OT comments  Patient progressing and showed improved willingness for out of bed ADLs, toilet transfer with Min As, peri hygiene with Max As, UE dressing of gown with Moderate assist, compared to previous session. Pt ambulated 300' with Min guard and 2 moments of unsteadiness with RW. Patient remains self-limiting by difficulty vs willingness to follow cues, to work on Bil shoulder ROM  or to attempt more functional mobility without pulling up on therapist, along with deficits noted below. Pt continues to demonstrate good rehab potential and would benefit from continued skilled OT to increase safety and independence with ADLs and functional transfers to allow pt to return home safely and reduce caregiver burden and fall risk.   Follow Up Recommendations  CIR;SNF    Equipment Recommendations  3 in 1 bedside commode    Recommendations for Other Services Rehab consult    Precautions / Restrictions Precautions Precautions: Fall Precaution Comments: B UE slings for comfort Required Braces or Orthoses: Sling Restrictions Weight Bearing Restrictions: No Other Position/Activity Restrictions: per orders 6/18 patient is WBAT B UEs       Mobility Bed Mobility Overal bed mobility: Needs Assistance Bed Mobility: Supine to Sit     Supine to sit: Mod assist;HOB elevated (Pt insisted on 2 HHA to raise trunk)          Transfers Overall transfer level: Needs assistance Equipment used: Rolling walker (2 wheeled) Transfers: Sit to/from [employer] Sit to Stand: Min assist Stand pivot transfers: Min guard       General transfer comment: cues to use momentum to stand, min A x2 for line management/safety while ambulating in hallway. Pt ambulated 300' with RW and RN assisting with lines as OT provided Min guard.    Balance Overall balance assessment: Needs assistance Sitting-balance support: Feet supported Sitting balance-[name]: Good     Standing balance support: Bilateral upper extremity supported Standing balance-[name]: Poor Standing balance comment: Required BUE support on RW today with 2 moments of unsteadiness and lifting front wheels of RW off  floor. Cues for anterioer weight shift to keep RW on floor.                           ADL either performed or assessed with clinical judgement   ADL       Grooming:  Supervision/safety;Standing;Wash/dry hands Grooming Details (indicate cue type and reason): Pt stood for hand hygiene post toileting with supervision       Lower Body Bathing Details (indicate cue type and reason): See toileting. Upper Body Dressing : Moderate assistance;Sitting Upper Body Dressing Details (indicate cue type and reason): to don fresh gown.     Toilet Transfer: Minimal assistance;+2 for safety/equipment;Ambulation;RW;Regular Toilet;Grab bars Toilet Transfer Details (indicate cue type and reason): min A x2 for safety/line management to power up to standing from elevated bed height. cue to use momentum. Cues for hand placement for safety.  Pt prefers to pull up on therapist and needs additional cues to comply. Toileting- Clothing Manipulation and Hygiene: Maximal assistance;Sit to/from stand;Cueing for compensatory techniques Toileting - Clothing Manipulation Details (indicate cue type and reason): Pt encouraged to attempt hygiene. Pt reported LT UE is less painful and pt's dominant hand. Wash cloth placed in LT hand. Pt stood and attempted hygiene reaching partially for cleaning, but required max assist for thoroughness.       General ADL Comments: patient needing max encouragement and redirection due to easily distracted, demanding and decreased attention to tasks. Pt refusing cues for hand placement for safety when standing from bed and refused to try methd of getting OOB and insisted on pulling up on therapist.     Vision   Vision Assessment?: No apparent visual deficits   Perception     Praxis      Cognition Arousal/Alertness: Awake/alert Behavior During Therapy: Agitated;Restless;Impulsive Overall Cognitive Status: Within Functional Limits for tasks assessed Area of Impairment: Problem solving                     Memory: Decreased recall of precautions;Decreased short-term memory Following Commands: Follows one step commands with increased time;Follows  multi-step commands inconsistently   Awareness: Emergent Problem Solving: Slow processing;Requires verbal cues;Difficulty sequencing General Comments: still becomes distracted and heavily perseverates on other needs. Does not respond well to attempts at redirection and becomes agitated.        Exercises Other Exercises Other Exercises: Attempted to have patient engage in shoulder ROM however states he needs more pain medication " they need to heal more." Pt educated that if he continues to wait for more healing he will likley stiffen up. Pt verbalized understanding but continued to refuse exercises and asked for ambulation instead.   Shoulder Instructions       General Comments      Pertinent Vitals/ Pain       Pain Assessment: 0-10 Pain Score: 7  Pain Location: lower back Pain Descriptors / Indicators: Guarding Pain Intervention(s): Limited activity within patient's tolerance;Monitored during session (RN present during pain report.)  Home Living                                          Prior Functioning/Environment              Frequency  Min 2X/week        Progress Toward Goals  OT Goals(current goals can now be found in  the care plan section)  Progress towards OT goals: Progressing toward goals  Acute Rehab OT Goals Patient Stated Goal: to regain strength OT Goal Formulation: With patient Time For Goal Achievement: 02/28/21 Potential to Achieve Goals: Good  Plan Discharge plan remains appropriate    Co-evaluation                 AM-PAC OT "6 Clicks" Daily Activity     Outcome Measure   Help from another person eating meals?: A Little Help from another person taking care of personal grooming?: A Little Help from another person toileting, which includes using toliet, bedpan, or urinal?: A Lot Help from another person bathing (including washing, rinsing, drying)?: A Lot Help from another person to put on and taking off regular  upper body clothing?: A Lot Help from another person to put on and taking off regular lower body clothing?: A Lot 6 Click Score: 14    End of Session Equipment Utilized During Treatment: Gait belt;Rolling walker  OT Visit Diagnosis: Unsteadiness on feet (R26.81);Other abnormalities of gait and mobility (R26.89);History of falling (Z91.81);Muscle weakness (generalized) (M62.81);Pain Pain - Right/Left:  (back) Pain - part of body:  (back)   Activity Tolerance Patient tolerated treatment well   Patient Left in chair;with call bell/phone within reach;with nursing/sitter in room;with family/visitor present   Nurse Communication Mobility status        Time: 1420-1505 OT Time Calculation (min): 45 min  Charges: OT General Charges $OT Visit: 1 Visit OT Treatments $Self Care/Home Management : 8-22 mins $Therapeutic Activity: 23-37 mins  Victorino Dike, OT Acute Rehab Services Office: [phone] 02/17/2021   Theodoro Clock 02/17/2021, 3:19 PM

## 2021-02-17 NOTE — Progress Notes (Signed)
PT Cancellation Note  Patient Details Name: LIVINGSTON DENNER MRN: [id number] DOB: June 15, 1966   Cancelled Treatment:    Reason Eval/Treat Not Completed: Other (comment), patient ambulated with RN today.   Rada Hay 02/17/2021, 4:14 PM  Blanchard Kelch PT Acute Rehabilitation Services Pager [phone] Office [phone]

## 2021-02-18 ENCOUNTER — Inpatient Hospital Stay (HOSPITAL_COMMUNITY): Payer: Commercial Managed Care - PPO

## 2021-02-18 LAB — CULTURE, BLOOD (SINGLE)
Culture: NO GROWTH
Special Requests: ADEQUATE

## 2021-02-18 LAB — BASIC METABOLIC PANEL
Anion gap: 8 (ref 5–15)
BUN: 6 mg/dL (ref 6–20)
CO2: 30 mmol/L (ref 22–32)
Calcium: 8.3 mg/dL — ABNORMAL LOW (ref 8.9–10.3)
Chloride: 95 mmol/L — ABNORMAL LOW (ref 98–111)
Creatinine, Ser: 0.46 mg/dL — ABNORMAL LOW (ref 0.61–1.24)
GFR, Estimated: >60 mL/min (ref >60)
Glucose, Bld: 128 mg/dL — ABNORMAL HIGH (ref 70–99)
Potassium: 4.2 mmol/L (ref 3.5–5.1)
Sodium: 133 mmol/L — ABNORMAL LOW (ref 135–145)

## 2021-02-18 LAB — GLUCOSE, CAPILLARY
Glucose-Capillary: 105 mg/dL — ABNORMAL HIGH (ref 70–99)
Glucose-Capillary: 146 mg/dL — ABNORMAL HIGH (ref 70–99)
Glucose-Capillary: 71 mg/dL (ref 70–99)
Glucose-Capillary: 109 mg/dL — ABNORMAL HIGH (ref 70–99)

## 2021-02-18 LAB — CBC WITH DIFFERENTIAL/PLATELET
Abs Immature Granulocytes: 0.09 10*3/uL — ABNORMAL HIGH (ref 0.00–0.07)
Basophils Absolute: 0 10*3/uL (ref 0.0–0.1)
Basophils Relative: 0 %
Eosinophils Absolute: 0.2 10*3/uL (ref 0.0–0.5)
Eosinophils Relative: 2 %
HCT: 22.9 % — ABNORMAL LOW (ref 39.0–52.0)
Hemoglobin: 7.4 g/dL — ABNORMAL LOW (ref 13.0–17.0)
Immature Granulocytes: 1 %
Lymphocytes Relative: 13 %
Lymphs Abs: 1.2 10*3/uL (ref 0.7–4.0)
MCH: 30.2 pg (ref 26.0–34.0)
MCHC: 32.3 g/dL (ref 30.0–36.0)
MCV: 93.5 fL (ref 80.0–100.0)
Monocytes Absolute: 0.9 10*3/uL (ref 0.1–1.0)
Monocytes Relative: 9 %
Neutro Abs: 7.3 10*3/uL (ref 1.7–7.7)
Neutrophils Relative %: 75 %
Platelets: 606 10*3/uL — ABNORMAL HIGH (ref 150–400)
RBC: 2.45 MIL/uL — ABNORMAL LOW (ref 4.22–5.81)
RDW: 15 % (ref 11.5–15.5)
WBC: 9.7 10*3/uL (ref 4.0–10.5)
nRBC: 0 % (ref 0.0–0.2)

## 2021-02-18 LAB — MAGNESIUM: Magnesium: 1.6 mg/dL — ABNORMAL LOW (ref 1.7–2.4)

## 2021-02-18 MED ORDER — MAGNESIUM OXIDE -MG SUPPLEMENT 400 (240 MG) MG PO TABS
400.0000 mg | ORAL_TABLET | Freq: Two times a day (BID) | ORAL | Status: DC
  Administered 2021-02-18 – 2021-02-19 (×4): 400 mg via ORAL
  Filled 2021-02-18 (×4): qty 1

## 2021-02-18 NOTE — Progress Notes (Signed)
Progress Note    John Ware   ZOX:[id number]  DOB: May 10, 1966  DOA: 01/31/2021     18  PCP: Sharion Balloon, FNP  CC: Weakness, ongoing pain  Hospital Course: John Ware is a 55 y.o. male with medical history significant of chronic pain, HTN, DM2.  He had at home positive COVID test on 01/12/2021.  He had no respiratory symptoms at this time but he did have fevers that was responsive to Tylenol.  Reportedly he apparently fell out of his chair and hurt his left shoulder and he was evaluated by the ED and they found no fracture and he was given a sling and told to follow-up with orthopedics.  Since that time the patient's wife reports that he has had general body aches and pain and has increased symptoms that are not responsive to his chronic pain meds.  He is also had poor appetite during this time and has been unable to walk for last few days.  Because of these constantly some symptoms he is brought to the ED for further evaluation.  In the ED is found to have a sodium of 113 and a glucose of 339 and a WBC of 20.5 as well as a lactic acid of 3.8.  He started on broad-spectrum antibiotics and given fluids.  Soon after his blood cultures became positive for 4-4 MSSA.  He was transitioned to IV Ancef.  With further work-up and confirmation he was also found to have a large fluid collection of left lateral posterior chest and back.  He did have tenderness in his back.  He underwent MRI T-spine and CT of the chest abdomen pelvis.  There is no evidence of any osteomyelitis or discitis or other underlying infection on MRI T-spine.  CT of the chest did show a large multiloculated fluid collection.  IR was consulted and he underwent an ultrasound-guided aspiration was sent for culturing.  CT was concerning for evidence of septic emboli but 2D echo did not reveal any evidence of an endocarditis.  Cardiology was consulted for TEE per ID recommendations and he underwent TEE on 02/05/2021 which found a  large tricuspid valve vegetation with mild tricuspid regurg.  Subsequently he still complained of some shoulder pain so he underwent bilateral MRIs of his shoulders and he is still having fevers.  At the left shoulder was concerned for septic arthritis and left axillary abscess.  Orthopedics was reconsulted as well as IR and the orthopedic surgeon is planning to do a washout tomorrow in edition IR is going to place a drain on the residual fluid collection that may exist after washout.  Because of the surgery ID changed the patient to nafcillin however recommending continuing cefazolin at this time. ESR and CRP and both are elevated.   Patient underwent on 02/08/21 left shoulder excisional debridement, deep abscess with arthrotomy and irrigation debridement of the joint with biceps tenotomy and excision of the necrotic rotator cuff.  Patient is not medically ready to be D/C'd to CIR.   Postoperatively he had a CT scan and repeated and showed a marked increase in size of the abscess collection at the left lateral chest wall with persistent abscess again seen in the subscapular extending cranial to the apex of the left hemithorax.  Since he is post I&D it was unclear about how much of the increase in size of the abscess was due to the postsurgical changes progression of the abscess collection.  Patient did have some small bilateral pleural  effusions and bibasilar atelectasis as well as reactive left axillary adenopathy.  Post surgery patient was given Decadron 10 mg last night.  Because of the changes on his repeat CT scan with a left chest wall abscess with gas on CT General surgery was consulted and they are planning on taking the patient back to the OR to address his right shoulder along with a large left lateral chest wall abscess and this was done 6/18.  ID recommends continuing IV cefazolin currently  Interval History:  No events overnight.  Remains on the PCA but his pain is controlled and he is content  with this.  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   Hyponatremia - Presumed due to dehydration and hypokalemia - severely low on admission, 113 - stabilizing out -BMP daily  Bacteremia due to Staphylococcus aureus-resolved as of 02/14/2021 - 4/4 blood cultures on admission growing MSSA -Repeated blood cultures on 02/01/2021; NGTD - TTE negative for vegetations; cardiology consulted for TEE, 02/05/21: POSITIVE for TV vegetation - now s/p IR drainage of left posterior chest wall fluid collection which was noted to be purulent in nature; culture also growing MSSA - MRI T spine 02/01/21 negative for OM/discitis  - MRI brain 6/12 negative for pathology in visual cortex to explain "floaters" in visual fields; seen by ophtho, negative for endophthalmitis - continue ancef - ID following  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 Since patient is post I & D of the chest wall abscess, it is  uncertain how much of the increase in size of the abscess is due to postsurgical changes versus progression of abscess collection   Small BILATERAL pleural effusions and bibasilar atelectasis. Reactive LEFT axillary adenopathy." -General surgery evaluated and patient does have a large 17 x 16 x 6 cm fluid collection in the left lateral chest wall that does not have any communication to the actual thorax cavity.  General surgery feel this needs to be drained and he was taken to the OR by Dr. Redmond Pulling 02/10/21. -He is s/p "Incision and drainage of multiple left lateral chest wall abscesses along with excisional debridement of left lateral chest wall skin, soft tissue, muscle, and fascia and axilla." -Surgery did dressing change this AM on 02/12/21 he had his Dressing Changes done. Cx showing Abundant Staph Aureus but Sensitivities pending and likely MSSA. Surgery recommending no further debridement at this time and start BID Dressing changes (Wet to Dry) -Cx Showing Abundant Staphylococcus Aures that is Resistant to Ciprofloxacin and Erythromycin -As Above Wound Vac now in place and [place] nurse consulted.  BID Wet to Dry Dressing Changes and Orthopedic Surgery repeating Scans of shoulders and chest  - s/p left supraclavicular IR abscess drainage on 6/23  Right shoulder pain - s/p Right shoulder excisional debridement, including biceps tenolysis. Evacuation of purulent abscess, right subacromial space, as well as subcoracoid bursa. Right shoulder arthrotomy with irrigation and debridement of joint.  Right shoulder open distal clavicle resection" - ortho following for repeat washout or imaging  Left shoulder pain - See work-up above - Was previously seen in the ER after his fall and recommended for a sling with outpatient Ortho follow-up -Bilateral shoulder x-rays unremarkable but MRI left shoulder shows large abscess in left axilla; partial width tear of anterior supraspinatus, high-grade partial-thickness  articular surface tear of the posterior supraspinatus, high-grade partial-thickness articular surface tear of superior subscapularis, complete tear of biceps long head tendon within the bicipital groove - now s/p left shoulder excisional debridement, deep abscess, with arthrotomy and irrigation and debridement of the joint with biceps tenotomy and excision of necrotic rotator cuff with ortho surgery -Repeat left shoulder CT on 02/13/2021 shows persistent abscess along left chest wall/axilla - plan for repeat imaging next week with ortho and possibly more washout in OR  Vision changes - Patient now stating on 02/04/2021 that he has been seeing "black floaters" in his field of vision for several days.  When questioned why he did not bring this up sooner, he did not have a good answer and did not want to "trouble [place]" - Etiology unclear but concern would be for septic emboli given CT chest also concerning for septic emboli to the lungs - MRI brain ordered: punctate acute or subacute infarct vs artifact in high left frontal lobe. This would not affect his vision as he is noting.  -Evaluated by ophthalmology, 02/05/2021.  No endophthalmitis  DKA (diabetic ketoacidosis) (HCC)-resolved as of 02/02/2021 - present on admission - responded to fluids and insulin - now resolved  Thrombocytosis - Considered in setting of underlying infection - Follow CBC  Normocytic anemia - Continue monitoring CBC  Obesity -Complicates overall prognosis and care -Body mass index is 30.78 kg/m. -Weight Loss and Dietary Counseling given  Ascending aorta dilatation (HCC) - TTE on 6/10 notes mild dilation of ascending aorta, 38 mm - follow TEE results as well: normal thoracic and ascending aorta noted on 02/05/21  Anxiety - Wellbutrin and Zoloft on hold for now in setting of hyponatremia  HTN (hypertension) - Pressure normal on admission.  Will treat if needed  Diabetes mellitus ([place]) - Continue SSI and CBG  monitoring -Continue Lantus - A1c 10.2% on 01/31/2021  Right knee pain -Obtained X-Ray and showed "Medial joint space narrowing is noted with mild osteophytic change. Mild patellofemoral changes are noted as well. No joint effusion is seen. No acute fracture is noted."  Hyperkalemia-resolved as of 02/14/2021 - Noted earlier in the admission and treated with [facility] - Follow BMP   Old records reviewed in assessment of this patient  Antimicrobials: Cefepime 01/31/21 x 1 Rocephin 01/31/21 x 1 Vanc 01/31/21 x 1 Ancef 02/01/21 >> current  DVT prophylaxis: enoxaparin (LOVENOX) injection 40 mg Start: 02/11/21 1000 Sequential compression device to OR Start: 02/10/21 1413 SCDs Start: 02/08/21 2116   Code Status:   Code Status: Full Code Family Communication:   Disposition Plan: Status is: Inpatient  Remains inpatient appropriate because:Ongoing diagnostic testing needed not appropriate for outpatient work up, IV treatments appropriate due to intensity of illness or inability to take PO, and Inpatient level  of care appropriate due to severity of illness  Dispo: The patient is from: Home              Anticipated d/c is to:  Possibly CIR              Patient currently is not medically stable to d/c.   Difficult to place patient No  Risk of unplanned readmission score: Unplanned Admission- Pilot do not use: 24.68   Objective: Blood pressure 113/75, pulse 93, temperature 98.7 F (37.1 C), temperature source Oral, resp. rate 15, height _0  (1.778 m), weight 97.3 kg, SpO2 97 %.  Examination: General appearance: alert, cooperative, fatigued, and no distress Head: Normocephalic, without obvious abnormality, atraumatic Eyes:  EOMI; gross field of vision is intact Back:  Improved swelling Lungs: clear to auscultation bilaterally Chest wall:  Bilateral bandages noted over previously drained abscesses Back: B/L paraspinal drains in place, bulbs noted with clear/purulent drainage Heart: regular  rate and rhythm, S1, S2 normal, and unable to appreciate any murmurs Abdomen: normal findings: bowel sounds normal and soft, non-tender Extremities: 1+ LE edema; much improved  Skin: mobility and turgor normal Neurologic: Grossly normal  Consultants:  ID Orthopedic surgery General surgery  Procedures:  IR drainage of back fluid collection, 6/10  Data Reviewed: I have personally reviewed following labs and imaging studies Results for orders placed or performed during the hospital encounter of 01/31/21 (from the past 24 hour(s))  Glucose, capillary     Status: None   Collection Time: 02/17/21  5:07 PM  Result Value Ref Range   Glucose-Capillary 73 70 - 99 mg/dL  Glucose, capillary     Status: Abnormal   Collection Time: 02/17/21 10:06 PM  Result Value Ref Range   Glucose-Capillary 231 (H) 70 - 99 mg/dL  CBC with Differential/Platelet     Status: Abnormal   Collection Time: 02/18/21  5:08 AM  Result Value Ref Range   WBC 9.7 4.0 - 10.5 K/uL   RBC 2.45 (L) 4.22 - 5.81 MIL/uL   Hemoglobin 7.4 (L) 13.0 - 17.0 g/dL   HCT 22.9 (L) 39.0 - 52.0 %   MCV 93.5 80.0 - 100.0 fL   MCH 30.2 26.0 - 34.0 pg   MCHC 32.3 30.0 - 36.0 g/dL   RDW 15.0 11.5 - 15.5 %   Platelets 606 (H) 150 - 400 K/uL   nRBC 0.0 0.0 - 0.2 %   Neutrophils Relative % 75 %   Neutro Abs 7.3 1.7 - 7.7 K/uL   Lymphocytes Relative 13 %   Lymphs Abs 1.2 0.7 - 4.0 K/uL   Monocytes Relative 9 %   Monocytes Absolute 0.9 0.1 - 1.0 K/uL   Eosinophils Relative 2 %   Eosinophils Absolute 0.2 0.0 - 0.5 K/uL   Basophils Relative 0 %   Basophils Absolute 0.0 0.0 - 0.1 K/uL   Immature Granulocytes 1 %   Abs Immature Granulocytes 0.09 (H) 0.00 - 0.07 K/uL  Basic metabolic panel     Status: Abnormal   Collection Time: 02/18/21  5:08 AM  Result Value Ref Range   Sodium 133 (L) 135 - 145 mmol/L   Potassium 4.2 3.5 - 5.1 mmol/L   Chloride 95 (L) 98 - 111 mmol/L   CO2 30 22 - 32 mmol/L   Glucose, Bld 128 (H) 70 - 99 mg/dL    BUN 6 6 - 20 mg/dL   Creatinine, Ser 0.46 (L) 0.61 - 1.24 mg/dL   Calcium 8.3 (L) 8.9 -  10.3 mg/dL   GFR, Estimated >60 >60 mL/min   Anion gap 8 5 - 15  Magnesium     Status: Abnormal   Collection Time: 02/18/21  5:08 AM  Result Value Ref Range   Magnesium 1.6 (L) 1.7 - 2.4 mg/dL  Glucose, capillary     Status: Abnormal   Collection Time: 02/18/21  7:28 AM  Result Value Ref Range   Glucose-Capillary 105 (H) 70 - 99 mg/dL  Glucose, capillary     Status: Abnormal   Collection Time: 02/18/21 11:46 AM  Result Value Ref Range   Glucose-Capillary 146 (H) 70 - 99 mg/dL    Recent Results (from the past 240 hour(s))  Fungus Culture With Stain     Status: None (Preliminary result)   Collection Time: 02/08/21  6:48 PM   Specimen: Synovial, Left Shoulder; Body Fluid  Result Value Ref Range Status   Fungus Stain Final report  Final    Comment: (NOTE) Performed At: [facility] [address], [place] [id number] Rush Farmer MD [id number]    Fungus (Mycology) Culture PENDING  Incomplete   Fungal Source SHOULDER  Final    Comment: LEFT Performed at [facility], [place] [address], [place], [place] [address]   Aerobic/Anaerobic Culture w Gram Stain (surgical/deep wound)     Status: None   Collection Time: 02/08/21  6:48 PM   Specimen: Synovial, Left Shoulder; Body Fluid  Result Value Ref Range Status   Specimen Description   Final    SHOULDER LEFT Performed at [place] [address], [place], [place] [address]    Special Requests   Final    NONE Performed at [facility], [place] [address], [place], [place] [address]    Gram Stain   Final    ABUNDANT WBC PRESENT,BOTH PMN AND MONONUCLEAR RARE GRAM POSITIVE COCCI IN PAIRS    Culture   Final    RARE STAPHYLOCOCCUS AUREUS NO ANAEROBES ISOLATED Performed at [facility], [place] [address], [place], [place] [address]    Report Status 02/14/2021 FINAL   Final   Organism ID, Bacteria STAPHYLOCOCCUS AUREUS  Final      Susceptibility   Staphylococcus aureus - MIC*    CIPROFLOXACIN >=8 RESISTANT Resistant     ERYTHROMYCIN >=8 RESISTANT Resistant     GENTAMICIN <=0.5 SENSITIVE Sensitive     OXACILLIN 0.5 SENSITIVE Sensitive     TETRACYCLINE <=1 SENSITIVE Sensitive     VANCOMYCIN 1 SENSITIVE Sensitive     TRIMETH/SULFA <=10 SENSITIVE Sensitive     CLINDAMYCIN <=0.25 SENSITIVE Sensitive     RIFAMPIN <=0.5 SENSITIVE Sensitive     Inducible Clindamycin NEGATIVE Sensitive     * RARE STAPHYLOCOCCUS AUREUS  Fungus Culture Result     Status: None   Collection Time: 02/08/21  6:48 PM  Result Value Ref Range Status   Result 1 Comment  Final    Comment: (NOTE) KOH/Calcofluor preparation:  no fungus observed. Performed At: [facility] [place], [place] [id number] Rush Farmer MD [id number]   Fungus Culture With Stain     Status: None (Preliminary result)   Collection Time: 02/08/21  6:52 PM   Specimen: Joint, Other; Body Fluid  Result Value Ref Range Status   Fungus Stain Final report  Final    Comment: (NOTE) Performed At: [facility] [place], [place] [id number] Rush Farmer MD [id number]    Fungus (Mycology) Culture PENDING  Incomplete   Fungal Source SHOULDER  Final    Comment: LEFT BONE CHIPS Performed at [place] [address], [place], [place] [address]   Aerobic/Anaerobic Culture w Gram Stain (surgical/deep wound)     Status: None   Collection Time: 02/08/21  6:52 PM   Specimen: Joint, Other; Body Fluid  Result Value Ref Range Status   Specimen Description   Final    SHOULDER LEFT BONE CHIPS Performed at [place] [address] [place], [place] [address]    Special Requests   Final    NONE Performed at [facility], [place] [address] [place], [place] [address]    Gram Stain NO WBC SEEN NO ORGANISMS  SEEN   Final   Culture   Final    RARE STAPHYLOCOCCUS AUREUS NO ANAEROBES ISOLATED Performed at [facility], [place] [address] [place], [place] [address]    Report Status 02/14/2021 FINAL  Final   Organism ID, Bacteria STAPHYLOCOCCUS AUREUS  Final      Susceptibility   Staphylococcus aureus - MIC*    CIPROFLOXACIN >=8 RESISTANT Resistant     ERYTHROMYCIN >=8 RESISTANT Resistant     GENTAMICIN <=0.5 SENSITIVE Sensitive     OXACILLIN 0.5 SENSITIVE Sensitive     TETRACYCLINE <=1 SENSITIVE Sensitive     VANCOMYCIN 1 SENSITIVE Sensitive     TRIMETH/SULFA <=10 SENSITIVE Sensitive     CLINDAMYCIN <=0.25 SENSITIVE Sensitive     RIFAMPIN <=0.5 SENSITIVE Sensitive     Inducible Clindamycin NEGATIVE Sensitive     * RARE STAPHYLOCOCCUS AUREUS  Fungus Culture Result     Status: None   Collection Time: 02/08/21  6:52 PM  Result Value Ref Range Status   Result 1 Comment  Final    Comment: (NOTE) KOH/Calcofluor preparation:  no fungus observed. Performed At: [facility] [place], [place] [id number] Rush Farmer MD ZH:[id number]   Aerobic/Anaerobic Culture w Gram Stain (surgical/deep wound)     Status: None   Collection Time: 02/10/21 10:22 AM   Specimen: Wound  Result Value Ref Range Status   Specimen Description   Final    WOUND CHEST Performed at [place] [address] [place], [place] [address]    Special Requests   Final    NONE Performed at [facility], [place] [address], [place], [place] [address]    Gram Stain   Final    ABUNDANT WBC PRESENT,BOTH PMN AND MONONUCLEAR MODERATE GRAM POSITIVE COCCI    Culture   Final    ABUNDANT STAPHYLOCOCCUS AUREUS NO ANAEROBES ISOLATED Performed at [facility], [place] [address], [place],  [address]    Report Status 02/15/2021 FINAL  Final   Organism ID, Bacteria STAPHYLOCOCCUS AUREUS  Final      Susceptibility   Staphylococcus aureus - MIC*     CIPROFLOXACIN >=8 RESISTANT Resistant     ERYTHROMYCIN >=8 RESISTANT Resistant     GENTAMICIN <=0.5 SENSITIVE Sensitive     OXACILLIN 0.5 SENSITIVE Sensitive     TETRACYCLINE <=1 SENSITIVE Sensitive     VANCOMYCIN 1 SENSITIVE Sensitive     TRIMETH/SULFA <=10 SENSITIVE Sensitive     CLINDAMYCIN <=0.25 SENSITIVE Sensitive     RIFAMPIN <=0.5 SENSITIVE Sensitive     Inducible Clindamycin NEGATIVE Sensitive     * ABUNDANT STAPHYLOCOCCUS AUREUS  Culture, blood (single)     Status: None   Collection Time: 02/13/21  5:51  AM   Specimen: BLOOD  Result Value Ref Range Status   Specimen Description   Final    BLOOD RIGHT ANTECUBITAL Performed at [place] [address] [place], [place] [address]    Special Requests   Final    BOTTLES DRAWN AEROBIC ONLY Blood Culture adequate volume Performed at [place] [address], [place], [place] [address]    Culture   Final    NO GROWTH 5 DAYS Performed at [facility], [place] [address] [place], [place] [address]    Report Status 02/18/2021 FINAL  Final  Culture, blood (single)     Status: None (Preliminary result)   Collection Time: 02/14/21  2:12 AM   Specimen: BLOOD  Result Value Ref Range Status   Specimen Description   Final    BLOOD RIGHT ANTECUBITAL Performed at [place] [address], [place], [place] [address]    Special Requests   Final    BOTTLES DRAWN AEROBIC AND ANAEROBIC Blood Culture adequate volume Performed at [place] [address] [place], [place] [address]    Culture   Final    NO GROWTH 4 DAYS Performed at [facility], [place] [address], [place], [place] [address]    Report Status PENDING  Incomplete  Aerobic/Anaerobic Culture w Gram Stain (surgical/deep wound)     Status: None (Preliminary result)   Collection Time: 02/15/21  5:02 PM   Specimen: Abscess  Result Value Ref Range Status   Specimen Description    Final    ABSCESS LEFT SHOULDER Performed at [place] [address], [place], [place] [address]    Special Requests   Final    NONE Performed at [facility], [place] [address], [place], [place] [address]    Gram Stain NO WBC SEEN NO ORGANISMS SEEN   Final   Culture   Final    CULTURE REINCUBATED FOR BETTER GROWTH Performed at [facility], [place] [address], [place], [place] [address]    Report Status PENDING  Incomplete  Aerobic/Anaerobic Culture w Gram Stain (surgical/deep wound)     Status: None (Preliminary result)   Collection Time: 02/15/21  5:06 PM   Specimen: Abscess  Result Value Ref Range Status   Specimen Description   Final    ABSCESS POST US GUIDED LEFT SUPRAVICULAR ABSCESS DRAIN [place] Performed at [facility], [place] [address], [place],  [address]    Special Requests   Final    NONE Performed at [facility], [place] [address], [place], [place] [address]    Gram Stain   Final    FEW WBC PRESENT,BOTH PMN AND MONONUCLEAR FEW GRAM POSITIVE COCCI IN PAIRS    Culture   Final    CULTURE REINCUBATED FOR BETTER GROWTH Performed at [facility], [place] [address], [place],  [address]    Report Status PENDING  Incomplete     Radiology Studies: No results found. CT IMAGE GUIDED DRAINAGE BY PERCUTANEOUS CATHETER  Final Result    CT IMAGE GUIDED DRAINAGE BY PERCUTANEOUS CATHETER  Final Result    [place] IMAGE GUIDED FLUID DRAIN BY CATHETER  Final Result    MR CERVICAL SPINE W WO CONTRAST  Final Result    MR Lumbar Spine W Wo Contrast  Final Result    MR THORACIC SPINE W WO CONTRAST  Final Result    DG CHEST  PORT 1 VIEW  Final Result    CT SHOULDER LEFT WO CONTRAST  Final Result    CT SHOULDER RIGHT WO CONTRAST  Final Result    CT CHEST WO CONTRAST  Final Result    DG Chest Port 1 View  Final Result    CT SHOULDER LEFT WO CONTRAST  Final Result     CT CHEST WO CONTRAST  Final Result    DG Knee 1-2 Views Right  Final Result    MR Shoulder Right W Wo Contrast  Final Result    MR Shoulder Left W Wo Contrast  Final Result    MR BRAIN WO CONTRAST  Final Result    IR US Guide Bx Asp/Drain  Final Result    CT CHEST ABDOMEN PELVIS W CONTRAST  Final Result    DG Shoulder Left  Final Result    DG Shoulder Right  Final Result    MR THORACIC SPINE W WO CONTRAST  Final Result    DG Chest Port 1 View  Final Result      Scheduled Meds:  acetaminophen  500 mg Oral QID   Chlorhexidine Gluconate Cloth  6 each Topical Daily   enoxaparin (LOVENOX) injection  40 mg Subcutaneous Q24H   gabapentin  800 mg Oral TID   HYDROmorphone   Intravenous Q4H   insulin aspart  0-15 Units Subcutaneous TID WC   insulin aspart  0-5 Units Subcutaneous QHS   insulin aspart  4 Units Subcutaneous TID WC   insulin glargine  15 Units Subcutaneous Daily   magnesium oxide  400 mg Oral BID   mouth rinse  15 mL Mouth Rinse BID   polyethylene glycol  17 g Oral Daily   povidone-iodine  2 application Topical Once   senna-docusate  1 tablet Oral BID   sodium chloride flush  5 mL Intracatheter Q8H   PRN Meds: sodium chloride, alum & mag hydroxide-simeth, diphenhydrAMINE **OR** diphenhydrAMINE, docusate sodium, lip balm, magic mouthwash w/lidocaine, methocarbamol, metoCLOPramide **OR** metoCLOPramide (REGLAN) injection, naloxone **AND** sodium chloride flush, ondansetron (ZOFRAN) IV, oxyCODONE, traZODone Continuous Infusions:  sodium chloride Stopped (02/07/21 0508)    ceFAZolin (ANCEF) IV 2 g (02/18/21 0800)     LOS: 18 days  Time spent: Greater than 50% of the 35 minute visit was spent in counseling/coordination of care for the patient as laid out in the A&P.   Late entry: patient seen and rounded on 02/16/21  Dwyane Dee, MD [facility] 02/18/2021, 1:19 PM

## 2021-02-18 NOTE — Progress Notes (Signed)
NTs assisting patient OOB to chair.  Patient with several pieces of equipment and insisting NT to position chair in difficult position.  NTs asking patient to sit where they had it positioned due to patient tethers as they could safely place patient here and then afterwards reposition chair to patients request.  Patient became upset, cursing at NTs and attempted to pull locked chair over to where he would like it placed.  In doing so, patient lost balance and fell to R knee.  Patient assisted up and helped back to chair with no evidence of injury.  No contact with head occurred.  Wife present at bedside to witness.  Patient has no new complaints of pain related to fall.    02/18/21 1530  What Happened  Was fall witnessed? Yes  Who witnessed fall? [place], [place]  Patients activity before fall to/from bed, chair, or stretcher  Point of contact other (comment) (Knee)  Was patient injured? Yes  Follow Up  MD notified Dr. Lewie Chamber  Time MD notified [phone]  Family notified Yes - comment (Wife present at the time of fall)  Time family notified 1530  Additional tests No  Simple treatment Other (comment)  Adult Fall Risk Assessment  Risk Factor Category (scoring not indicated) Fall has occurred during this admission (document High fall risk)  Age 34  Fall History: Fall within 6 months prior to admission 0  Elimination; Bowel and/or Urine Incontinence 0  Elimination; Bowel and/or Urine Urgency/Frequency 2  Medications: includes PCA/Opiates, Anti-convulsants, Anti-hypertensives, Diuretics, Hypnotics, Laxatives, Sedatives, and Psychotropics 5  Patient Care Equipment 3  Mobility-Assistance 2  Mobility-Gait 0  Mobility-Sensory Deficit 0  Altered awareness of immediate physical environment 0  Impulsiveness 2  Lack of understanding of one's physical/cognitive limitations 0  Total Score 14  Patient Fall Risk Level High fall risk  Adult Fall Risk Interventions  Required Bundle  Interventions *See Row Information* High fall risk - low, moderate, and high requirements implemented  Additional Interventions Use of appropriate toileting equipment (bedpan, BSC, etc.)  Screening for Fall Injury Risk (To be completed on HIGH fall risk patients) - Assessing Need for Floor Mats  Risk For Fall Injury- Criteria for Floor Mats None identified - No additional interventions needed  Vitals  Temp 98.9 F (37.2 C)  Temp Source Oral  BP (!) 131/52  BP Location Left Arm  BP Method Automatic  Patient Position (if appropriate) Sitting  Pulse Rate 88  ECG Heart Rate 86  Resp 14  Oxygen Therapy  SpO2 100 %  O2 Flow Rate (L/min) 2 L/min  Pain Assessment  Pain Scale 0-10  Pain Score 0  PCA/Epidural/Spinal Assessment  Respiratory Pattern Regular  Patient/Family Education Done  Neurological  Neuro (WDL) WDL  Musculoskeletal  Musculoskeletal (WDL) X  Assistive Device Front wheel walker  Generalized Weakness Yes  Integumentary  Integumentary (WDL) X  Skin Color Appropriate for ethnicity  Skin Condition Dry  Skin Integrity Surgical Incision (see LDA)

## 2021-02-19 LAB — CBC WITH DIFFERENTIAL/PLATELET
Abs Immature Granulocytes: 0.05 10*3/uL (ref 0.00–0.07)
Basophils Absolute: 0 10*3/uL (ref 0.0–0.1)
Basophils Relative: 1 %
Eosinophils Absolute: 0.2 10*3/uL (ref 0.0–0.5)
Eosinophils Relative: 2 %
HCT: 23.5 % — ABNORMAL LOW (ref 39.0–52.0)
Hemoglobin: 7.7 g/dL — ABNORMAL LOW (ref 13.0–17.0)
Immature Granulocytes: 1 %
Lymphocytes Relative: 16 %
Lymphs Abs: 1.4 10*3/uL (ref 0.7–4.0)
MCH: 30.3 pg (ref 26.0–34.0)
MCHC: 32.8 g/dL (ref 30.0–36.0)
MCV: 92.5 fL (ref 80.0–100.0)
Monocytes Absolute: 0.9 10*3/uL (ref 0.1–1.0)
Monocytes Relative: 10 %
Neutro Abs: 6.2 10*3/uL (ref 1.7–7.7)
Neutrophils Relative %: 70 %
Platelets: 630 10*3/uL — ABNORMAL HIGH (ref 150–400)
RBC: 2.54 MIL/uL — ABNORMAL LOW (ref 4.22–5.81)
RDW: 15 % (ref 11.5–15.5)
WBC: 8.7 10*3/uL (ref 4.0–10.5)
nRBC: 0 % (ref 0.0–0.2)

## 2021-02-19 LAB — GLUCOSE, CAPILLARY
Glucose-Capillary: 111 mg/dL — ABNORMAL HIGH (ref 70–99)
Glucose-Capillary: 153 mg/dL — ABNORMAL HIGH (ref 70–99)
Glucose-Capillary: 116 mg/dL — ABNORMAL HIGH (ref 70–99)
Glucose-Capillary: 123 mg/dL — ABNORMAL HIGH (ref 70–99)

## 2021-02-19 LAB — BASIC METABOLIC PANEL
Anion gap: 8 (ref 5–15)
BUN: 6 mg/dL (ref 6–20)
CO2: 28 mmol/L (ref 22–32)
Calcium: 8 mg/dL — ABNORMAL LOW (ref 8.9–10.3)
Chloride: 95 mmol/L — ABNORMAL LOW (ref 98–111)
Creatinine, Ser: 0.48 mg/dL — ABNORMAL LOW (ref 0.61–1.24)
GFR, Estimated: >60 mL/min (ref >60)
Glucose, Bld: 100 mg/dL — ABNORMAL HIGH (ref 70–99)
Potassium: 4.3 mmol/L (ref 3.5–5.1)
Sodium: 131 mmol/L — ABNORMAL LOW (ref 135–145)

## 2021-02-19 LAB — CULTURE, BLOOD (SINGLE)
Culture: NO GROWTH
Special Requests: ADEQUATE

## 2021-02-19 LAB — MAGNESIUM: Magnesium: 1.7 mg/dL (ref 1.7–2.4)

## 2021-02-19 MED ORDER — GABAPENTIN 400 MG PO CAPS
400.0000 mg | ORAL_CAPSULE | Freq: Three times a day (TID) | ORAL | Status: DC
  Administered 2021-02-19 – 2021-02-26 (×11): 400 mg via ORAL
  Filled 2021-02-19 (×19): qty 1

## 2021-02-19 MED ORDER — HYDROMORPHONE HCL 1 MG/ML IJ SOLN
1.0000 mg | Freq: Once | INTRAMUSCULAR | Status: AC | PRN
  Administered 2021-02-22: 1 mg via INTRAVENOUS
  Filled 2021-02-19: qty 1

## 2021-02-19 NOTE — Progress Notes (Signed)
[facility] Progress Note:   9 Days Post-Op  Subjective: Mental status is alert.  Complaints pain with changing VAC--requests it being done in the OR tomorrow. Objective: Vital signs in last 24 hours: Temp:  [98.9 F (37.2 C)-100.7 F (38.2 C)] 98.9 F (37.2 C) (06/27 1241) Pulse Rate:  [82-97] 82 (06/27 1241) Resp:  [10-18] 10 (06/27 1644) BP: (103-138)/(61-86) 103/74 (06/27 1241) SpO2:  [97 %-100 %] 98 % (06/27 1644)  Intake/Output from previous day: 06/26 0701 - 06/27 0700 In: 560 [P.O.:360; IV Piggyback:200] Out: 2290 [Urine:1350; Drains:940] Intake/Output this shift: Total I/O In: 135 [P.O.:120; Other:15] Out: 975 [Urine:925; Drains:50]  Physical Exam: Work of breathing is OK.  Multiple drained sites on the back  Lab Results:  Results for orders placed or performed during the hospital encounter of 01/31/21 (from the past 48 hour(s))  Glucose, capillary     Status: Abnormal   Collection Time: 02/17/21 10:06 PM  Result Value Ref Range   Glucose-Capillary 231 (H) 70 - 99 mg/dL    Comment: Glucose reference range applies only to samples taken after fasting for at least 8 hours.  CBC with Differential/Platelet     Status: Abnormal   Collection Time: 02/18/21  5:08 AM  Result Value Ref Range   WBC 9.7 4.0 - 10.5 K/uL   RBC 2.45 (L) 4.22 - 5.81 MIL/uL   Hemoglobin 7.4 (L) 13.0 - 17.0 g/dL   HCT 91.5 (L) 05.6 - 97.9 %   MCV 93.5 80.0 - 100.0 fL   MCH 30.2 26.0 - 34.0 pg   MCHC 32.3 30.0 - 36.0 g/dL   RDW 48.0 16.5 - 53.7 %   Platelets 606 (H) 150 - 400 K/uL   nRBC 0.0 0.0 - 0.2 %   Neutrophils Relative % 75 %   Neutro Abs 7.3 1.7 - 7.7 K/uL   Lymphocytes Relative 13 %   Lymphs Abs 1.2 0.7 - 4.0 K/uL   Monocytes Relative 9 %   Monocytes Absolute 0.9 0.1 - 1.0 K/uL   Eosinophils Relative 2 %   Eosinophils Absolute 0.2 0.0 - 0.5 K/uL   Basophils Relative 0 %   Basophils Absolute 0.0 0.0 - 0.1 K/uL   Immature Granulocytes 1 %   Abs Immature Granulocytes  0.09 (H) 0.00 - 0.07 K/uL    Comment: Performed at [facility], [address], [place], [place] [address]  Basic metabolic panel     Status: Abnormal   Collection Time: 02/18/21  5:08 AM  Result Value Ref Range   Sodium 133 (L) 135 - 145 mmol/L   Potassium 4.2 3.5 - 5.1 mmol/L   Chloride 95 (L) 98 - 111 mmol/L   CO2 30 22 - 32 mmol/L   Glucose, Bld 128 (H) 70 - 99 mg/dL    Comment: Glucose reference range applies only to samples taken after fasting for at least 8 hours.   BUN 6 6 - 20 mg/dL   Creatinine, Ser 7.86 (L) 0.61 - 1.24 mg/dL   Calcium 8.3 (L) 8.9 - 10.3 mg/dL   GFR, Estimated >75 >44 mL/min    Comment: (NOTE) Calculated using the CKD-EPI Creatinine Equation (2021)    Anion gap 8 5 - 15    Comment: Performed at [facility], [address], [place], [place] [address]  Magnesium     Status: Abnormal   Collection Time: 02/18/21  5:08 AM  Result Value Ref Range   Magnesium 1.6 (L) 1.7 - 2.4  mg/dL    Comment: Performed at [facility], [address] [place], [place] [address]  Glucose, capillary     Status: Abnormal   Collection Time: 02/18/21  7:28 AM  Result Value Ref Range   Glucose-Capillary 105 (H) 70 - 99 mg/dL    Comment: Glucose reference range applies only to samples taken after fasting for at least 8 hours.  Glucose, capillary     Status: Abnormal   Collection Time: 02/18/21 11:46 AM  Result Value Ref Range   Glucose-Capillary 146 (H) 70 - 99 mg/dL    Comment: Glucose reference range applies only to samples taken after fasting for at least 8 hours.  Glucose, capillary     Status: None   Collection Time: 02/18/21  4:19 PM  Result Value Ref Range   Glucose-Capillary 71 70 - 99 mg/dL    Comment: Glucose reference range applies only to samples taken after fasting for at least 8 hours.  Glucose, capillary     Status: Abnormal   Collection Time: 02/18/21 10:14 PM  Result Value Ref Range    Glucose-Capillary 109 (H) 70 - 99 mg/dL    Comment: Glucose reference range applies only to samples taken after fasting for at least 8 hours.  CBC with Differential/Platelet     Status: Abnormal   Collection Time: 02/19/21  4:31 AM  Result Value Ref Range   WBC 8.7 4.0 - 10.5 K/uL   RBC 2.54 (L) 4.22 - 5.81 MIL/uL   Hemoglobin 7.7 (L) 13.0 - 17.0 g/dL   HCT 95.223.5 (L) 84.139.0 - 32.452.0 %   MCV 92.5 80.0 - 100.0 fL   MCH 30.3 26.0 - 34.0 pg   MCHC 32.8 30.0 - 36.0 g/dL   RDW 40.115.0 02.711.5 - 25.315.5 %   Platelets 630 (H) 150 - 400 K/uL   nRBC 0.0 0.0 - 0.2 %   Neutrophils Relative % 70 %   Neutro Abs 6.2 1.7 - 7.7 K/uL   Lymphocytes Relative 16 %   Lymphs Abs 1.4 0.7 - 4.0 K/uL   Monocytes Relative 10 %   Monocytes Absolute 0.9 0.1 - 1.0 K/uL   Eosinophils Relative 2 %   Eosinophils Absolute 0.2 0.0 - 0.5 K/uL   Basophils Relative 1 %   Basophils Absolute 0.0 0.0 - 0.1 K/uL   Immature Granulocytes 1 %   Abs Immature Granulocytes 0.05 0.00 - 0.07 K/uL    Comment: Performed at [facility], [address] [place], [place] [address]  Basic metabolic panel     Status: Abnormal   Collection Time: 02/19/21  4:31 AM  Result Value Ref Range   Sodium 131 (L) 135 - 145 mmol/L   Potassium 4.3 3.5 - 5.1 mmol/L   Chloride 95 (L) 98 - 111 mmol/L   CO2 28 22 - 32 mmol/L   Glucose, Bld 100 (H) 70 - 99 mg/dL    Comment: Glucose reference range applies only to samples taken after fasting for at least 8 hours.   BUN 6 6 - 20 mg/dL   Creatinine, Ser 3.470.48 (L) 0.61 - 1.24 mg/dL   Calcium 8.0 (L) 8.9 - 10.3 mg/dL   GFR, Estimated >42>60 >59>60 mL/min    Comment: (NOTE) Calculated using the CKD-EPI Creatinine Equation (2021)    Anion gap 8 5 - 15    Comment: Performed at [facility], [address] [place], [place] [address]  Magnesium     Status: None  Collection Time: 02/19/21  4:31 AM  Result Value Ref Range   Magnesium 1.7 1.7 - 2.4 mg/dL    Comment: Performed at  [facility], [address] [place], [place] [address]  Glucose, capillary     Status: Abnormal   Collection Time: 02/19/21  8:12 AM  Result Value Ref Range   Glucose-Capillary 111 (H) 70 - 99 mg/dL    Comment: Glucose reference range applies only to samples taken after fasting for at least 8 hours.  Glucose, capillary     Status: Abnormal   Collection Time: 02/19/21 11:53 AM  Result Value Ref Range   Glucose-Capillary 153 (H) 70 - 99 mg/dL    Comment: Glucose reference range applies only to samples taken after fasting for at least 8 hours.  Glucose, capillary     Status: Abnormal   Collection Time: 02/19/21  4:43 PM  Result Value Ref Range   Glucose-Capillary 116 (H) 70 - 99 mg/dL    Comment: Glucose reference range applies only to samples taken after fasting for at least 8 hours.    Radiology/Results: CT CHEST WO CONTRAST  Result Date: 02/18/2021 CLINICAL DATA:  Left axillary abscess status post incision and drainage. Follow-up examination. EXAM: CT CHEST WITHOUT CONTRAST TECHNIQUE: Multidetector CT imaging of the chest was performed following the standard protocol without IV contrast. COMPARISON:  02/13/2021, 02/09/2021 FINDINGS: Cardiovascular: No significant coronary artery calcification. Global cardiac size within normal limits. Hypoattenuation of the cardiac blood pool is again identified and is unchanged, in keeping with at least mild anemia. No pericardial effusion. Central pulmonary arteries are of normal caliber. Thoracic aorta is unremarkable. Mediastinum/Nodes: Bulky adenopathy within the left axilla is unchanged. No mediastinal or hilar adenopathy identified. No supraclavicular adenopathy identified though multiple small shotty left supraclavicular lymph nodes are identified, possibly reactive in nature. Visualized thyroid unremarkable. The esophagus is unremarkable. Lungs/Pleura: Mild bibasilar atelectasis.  Stable pulmonary nodules: 7 mm, subpleural left  upper lobe, axial image # 110/7 5 mm, left upper lobe, axial image # 130/7 9 mm, lingula, axial image # 139/7 Small left pleural effusion is unchanged. No pneumothorax. Central airways are widely patent. Upper Abdomen: No acute abnormality. Musculoskeletal: Penrose type drain again noted within the inferior left axilla and posterolateral left chest wall, similar prior examination. Near complete resolution of subcutaneous debris and fluid within the left posterolateral chest wall adjacent to the surgical incision noted at axial image # 70. Mottled gas within the high left axilla adjacent to the left axillary vein, axial image # 37, likely represents a a necrotic lymph node and appears stable since prior examination. There is mottled gas and fluid within the joint capsule of the left shoulder, as seen on prior examination, now with extension of the gas in fluid into the subcutaneous if tissues of the left shoulder anteriorly, best appreciated on axial image # 34/2. Fluid is again seen tracking from the a left glenohumeral joint capsule into the subscapular space and extending superiorly to surround the apical left thoracic cage in extend into the supraclavicular region. A pigtail drainage catheter has been placed in this collection and there has been slight interval decrease in the amount of fluid noted superiorly, best appreciated on axial image # 20/2. Right shoulder effusion is again identified. Punctate foci of gas are again seen within the soft tissues anterior to the right shoulder within the pectoralis and deltoid musculature, similar to that noted on prior examination. IMPRESSION: Near complete resolution of fluid in debris within  the inferior, posterolateral left chest wall adjacent to the surgical incision and Penrose type surgical drainage catheter. Stable bulky lymphadenopathy within the left axilla with at least 1 necrotic lymph node noted superiorly, similar to prior examination. Gas and fluid within the  left shoulder joint capsule, similar to prior examination, now tracking anteriorly into the subcutaneous soft tissues anterior to the left shoulder as well as, similar prior examination, tracking posterior superiorly into the subscapular space and subsequently into the supraclavicular region. Interval placement of a pigtail type drainage catheter within the supraclavicular region with partial evacuation of fluid within this region. Stable right shoulder effusion and adjacent punctate gas and thickening of the pectoralis and deltoid musculature anterior to the coracoid process. Electronically Signed   By: Helyn Numbers MD   On: 02/18/2021 23:49    Anti-infectives: Anti-infectives (From admission, onward)    Start     Dose/Rate Route Frequency Ordered Stop   02/10/21 0727  ceFAZolin (ANCEF) 2-4 GM/100ML-% IVPB       Note to Pharmacy: Lyda Kalata   : cabinet override      02/10/21 0727 02/10/21 1856   02/01/21 0600  ceFAZolin (ANCEF) IVPB 2g/100 mL premix        2 g 200 mL/hr over 30 Minutes Intravenous Every 8 hours 02/01/21 0414     01/31/21 1800  cefTRIAXone (ROCEPHIN) 1 g in sodium chloride 0.9 % 100 mL IVPB  Status:  Discontinued        1 g 200 mL/hr over 30 Minutes Intravenous Every 24 hours 01/31/21 1453 02/01/21 0425   01/31/21 1115  vancomycin (VANCOREADY) IVPB 2000 mg/400 mL        2,000 mg 200 mL/hr over 120 Minutes Intravenous  Once 01/31/21 1105 01/31/21 1320   01/31/21 1045  ceFEPIme (MAXIPIME) 2 g in sodium chloride 0.9 % 100 mL IVPB        2 g 200 mL/hr over 30 Minutes Intravenous  Once 01/31/21 1035 01/31/21 1129   01/31/21 1045  metroNIDAZOLE (FLAGYL) IVPB 500 mg        500 mg 100 mL/hr over 60 Minutes Intravenous  Once 01/31/21 1035 01/31/21 1208   01/31/21 1045  vancomycin (VANCOCIN) IVPB 1000 mg/200 mL premix  Status:  Discontinued        1,000 mg 200 mL/hr over 60 Minutes Intravenous  Once 01/31/21 1035 01/31/21 1105       Assessment/Plan: Problem  List: Patient Active Problem List   Diagnosis Date Noted   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   Right shoulder pain 02/14/2021   Chest wall abscess 02/14/2021   Obesity 02/14/2021   Normocytic anemia 02/14/2021   Thrombocytosis 02/14/2021   Endocarditis 02/05/2021   Vision changes 02/04/2021   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   Pressure injury of skin 02/01/2021   HTN (hypertension) 02/01/2021   Anxiety 02/01/2021   Left shoulder pain 02/01/2021   Hyponatremia 01/31/2021   Positive colorectal cancer screening using Cologuard test 12/28/2020   DOE (dyspnea on exertion) 10/20/2020   Diabetic polyneuropathy associated with type 2 diabetes mellitus (HCC) 10/09/2020   Diabetes mellitus (HCC) 10/30/2018   Lumbar stenosis with neurogenic claudication 10/14/2017   DDD (degenerative disc disease), lumbar 02/17/2017   Left leg weakness 02/05/2017   Hyperlipidemia associated with type 2 diabetes mellitus (HCC) 10/24/2016   Depression, recurrent (HCC) 10/24/2016   Right knee pain 05/21/2016   DDD (degenerative disc disease), lumbosacral 05/21/2016   Overweight (BMI 25.0-29.9) 05/21/2016   Lumbar stenosis 12/13/2014  Will coordinate with orthopedics to change VAC in the OR.   9 Days Post-Op    LOS: 19 days   Matt B. Daphine Deutscher, MD, [facility] [phone] to reach the surgeon on call.    02/19/2021 5:27 PM

## 2021-02-19 NOTE — Progress Notes (Signed)
Pt Physical Therapy Treatment Patient Details Name: John Ware MRN: [id number] DOB: 07-06-1966 Today's Date: 02/19/2021    History of Present Illness Patient is a 55 y.o. male presented to ED with weakness and per wife generally unwell over the past 3 weeks. He had a positive home COVID test on 01/12/21. He had no respiratory symptoms at the time. He just had fevers that were responsive to APAP. He self isolated at home until 01/20/21 when he was at a party and experienced a fall. bil shoulder and spine xrays negative.  MRI brain=Punctate acute or subacute infarct versus artifact in the high left  frontal lobe. Otherwise, no evidence of recent infarction.  Underwent bilateral MRIs of his shoulders and he is still having fevers.  At the left shoulder was concerned for septic arthritis and left axillary abscess.  Orthopedics was reconsulted as well as IR.  Pt s/p  Left shoulder excisional debridement, deep abscess, with arthrotomy and irrigation and debridement of the joint with biceps tenotomy and excision of necrotic rotator cuff on 02/08/21. Patient s/p Right shoulder excisional debridement, including biceps tenolysis, Evacuation of purulent abscess, right subacromial space, as well as subcoracoid bursa, Right shoulder arthrotomy with irrigation and debridement of joint, Right shoulder open distal clavicle resection on 6/18.  Pt also s/p Incision and drainage of multiple left lateral chest wall abscesses along with excisional debridement on 6/18. PMH: significant of chronic pain, HTN, DM2, depression, spinal stenosis.    PT Comments    Pt OOB in recliner on 2 lts nasal due to PCA sats 98%.  Assisted with amb in hallway required + 2 assist due to safety (multiplie lines) 3 drains. Greatest difficulty was performing sit to stand due to recent shoulder surgery.  Pt will need ST Rehab at SNF prior to safe D/C to home.    Follow Up Recommendations  SNF (CIR was reviewed and declined)     Equipment  Recommendations  Rolling walker with 5" wheels    Recommendations for Other Services       Precautions / Restrictions Precautions Precautions: Fall Precaution Comments: B UE slings for comfort Required Braces or Orthoses: Sling Restrictions Other Position/Activity Restrictions: per orders 6/18 patient is WBAT B UEs    Mobility  Bed Mobility               General bed mobility comments: OOB in recliner    Transfers   Equipment used: Rolling walker (2 wheeled) Transfers: Sit to/from [employer] Sit to Stand: Min assist         General transfer comment: still having some difficulty performing self sit to stand due to recent shoulder surgery.  Ambulation/Gait Ambulation/Gait assistance: Min guard Gait Distance (Feet): 400 Feet Assistive device: Rolling walker (2 wheeled) Gait Pattern/deviations: Step-through pattern;Decreased stride length;Trunk flexed Gait velocity: decr   General Gait Details: cues for posture as pt continues to keep RW too far forward however, distance per pt preference, standing rest break after 200 feet for breathing   Stairs             Wheelchair Mobility    Modified Rankin (Stroke Patients Only)       Balance                                            Cognition Arousal/Alertness: Awake/alert Behavior During Therapy: [facility] for tasks assessed/performed  Overall Cognitive Status: Within Functional Limits for tasks assessed                                 General Comments: AxO x 3 following all commands but at time "honary" "impulsive" decrease regard to safety      Exercises      General Comments        Pertinent Vitals/Pain Pain Assessment: Faces Faces Pain Scale: Hurts even more Pain Location: lower backand L shoulder Pain Descriptors / Indicators: Discomfort;Grimacing Pain Intervention(s): Monitored during session;Premedicated before session;Repositioned    Home  Living                      Prior Function            PT Goals (current goals can now be found in the care plan section) Progress towards PT goals: Progressing toward goals    Frequency    Min 3X/week      PT Plan Current plan remains appropriate    Co-evaluation              AM-PAC PT "6 Clicks" Mobility   Outcome Measure  Help needed turning from your back to your side while in a flat bed without using bedrails?: A Lot Help needed moving from lying on your back to sitting on the side of a flat bed without using bedrails?: A Lot Help needed moving to and from a bed to a chair (including a wheelchair)?: A Lot Help needed standing up from a chair using your arms (e.g., wheelchair or bedside chair)?: A Lot Help needed to walk in hospital room?: A Little Help needed climbing 3-5 steps with a railing? : A Lot 6 Click Score: 13    End of Session   Activity Tolerance: Patient tolerated treatment well Patient left: in chair;with call bell/phone within reach;with chair alarm set Nurse Communication: Mobility status PT Visit Diagnosis: Unsteadiness on feet (R26.81);History of falling (Z91.81);Difficulty in walking, not elsewhere classified (R26.2)     Time: 1540-0867 PT Time Calculation (min) (ACUTE ONLY): 25 min  Charges:  $Gait Training: 8-22 mins $Therapeutic Activity: 8-22 mins                     {Marlia Schewe  PTA Acute  Rehabilitation Services Pager      [phone] Office      [phone]

## 2021-02-19 NOTE — Progress Notes (Signed)
Referring Physician(s): Brown,Blaine PA-C  Supervising Physician: Richarda Overlie  Patient Status:  [facility] - In-pt  Chief Complaint: Shoulder/back pain  Subjective: Pt doing ok; sitting up in chair; cont to have some shoulder/back pain but states decreasing ; scheduled for bilat shoulder washout tomorrow   Allergies: Tizanidine  Medications: Prior to Admission medications   Medication Sig Start Date End Date Taking? Authorizing Provider  acetaminophen (TYLENOL) 325 MG tablet Take 650-975 mg by mouth every 6 (six) hours as needed for mild pain, fever or headache.   Yes [provider]  amLODipine (NORVASC) 5 MG tablet Take 1 tablet (5 mg total) by mouth daily. 06/23/20  Yes Hawks, Christy A, FNP  buPROPion (WELLBUTRIN XL) 150 MG 24 hr tablet TAKE (1) TABLET TWICE A DAY. Patient taking differently: Take 150 mg by mouth 2 (two) times daily. 01/19/21  Yes Hawks, Christy A, FNP  busPIRone (BUSPAR) 5 MG tablet TAKE 1 TABLET 3 TIMES A DAY Patient taking differently: Take 5 mg by mouth 3 (three) times daily. 11/17/20  Yes Hawks, Christy A, FNP  gabapentin (NEURONTIN) 800 MG tablet TAKE (1) TABLET THREE TIMES DAILY. Patient taking differently: Take 800 mg by mouth 3 (three) times daily. 01/19/21  Yes Jannifer Rodney A, FNP  metFORMIN (GLUCOPHAGE XR) 750 MG 24 hr tablet Take 2 tablets (1,500 mg total) by mouth daily with breakfast. 06/23/20 12/20/20 Yes Hawks, Christy A, FNP  methocarbamol (ROBAXIN) 500 MG tablet Take 1 tablet (500 mg total) by mouth 2 (two) times daily as needed for muscle spasms. 01/20/21  Yes Caccavale, Sophia, PA-C  oxyCODONE ER (XTAMPZA ER) 18 MG C12A Take 1 tablet by mouth every 12 (twelve) hours. 11/26/19  Yes [provider]  oxyCODONE-acetaminophen (PERCOCET) 10-325 MG tablet Take 1 tablet by mouth 3 (three) times daily as needed for pain. 01/25/21  Yes [provider]  ramipril (ALTACE) 10 MG capsule TAKE (1) CAPSULE DAILY Patient taking differently:  Take 10 mg by mouth daily. 06/23/20  Yes Hawks, Christy A, FNP  sertraline (ZOLOFT) 100 MG tablet Take 1 tablet (100 mg total) by mouth daily. 06/23/20  Yes Hawks, Christy A, FNP  Vitamin D, Ergocalciferol, (DRISDOL) 1.25 MG (50000 UNIT) CAPS capsule Take 50,000 Units by mouth once a week. 01/25/21  Yes [provider]  diclofenac Sodium (VOLTAREN) 1 % GEL Apply 2 g topically 4 (four) times daily. Patient not taking: Reported on 01/31/2021 01/20/21   Caccavale, Sophia, PA-C  empagliflozin (JARDIANCE) 10 MG TABS tablet Take 1 tablet (10 mg total) by mouth daily before breakfast. Patient not taking: Reported on 01/31/2021 10/10/20   Junie Spencer, FNP  atorvastatin (LIPITOR) 20 MG tablet Take 1 tablet (20 mg total) by mouth daily. Patient not taking: Reported on 06/23/2020 02/29/20 08/23/20  Junie Spencer, FNP     Vital Signs: BP 103/74 (BP Location: Right Arm)   Pulse 82   Temp 98.9 F (37.2 C) (Oral)   Resp 15   Ht 5\' 10"  (1.778 m)   Wt 214 lb 8.1 oz (97.3 kg)   SpO2 99%   BMI 30.78 kg/m   Physical Exam awake/alert; left [place] fossa drain, bilat flank/psoas drains intact; 40  c output from left [place] drain, OP amount not recorded for psoas drains; turbid beige to reddish beige fluid in bags/bulbs  Imaging: CT CHEST WO CONTRAST  Result Date: 02/18/2021 CLINICAL DATA:  Left axillary abscess status post incision and drainage. Follow-up examination. EXAM: CT CHEST WITHOUT CONTRAST TECHNIQUE: Multidetector  CT imaging of the chest was performed following the standard protocol without IV contrast. COMPARISON:  02/13/2021, 02/09/2021 FINDINGS: Cardiovascular: No significant coronary artery calcification. Global cardiac size within normal limits. Hypoattenuation of the cardiac blood pool is again identified and is unchanged, in keeping with at least mild anemia. No pericardial effusion. Central pulmonary arteries are of normal caliber. Thoracic aorta is unremarkable. Mediastinum/Nodes: Bulky  adenopathy within the left axilla is unchanged. No mediastinal or hilar adenopathy identified. No supraclavicular adenopathy identified though multiple small shotty left supraclavicular lymph nodes are identified, possibly reactive in nature. Visualized thyroid unremarkable. The esophagus is unremarkable. Lungs/Pleura: Mild bibasilar atelectasis.  Stable pulmonary nodules: 7 mm, subpleural left upper lobe, axial image # 110/7 5 mm, left upper lobe, axial image # 130/7 9 mm, lingula, axial image # 139/7 Small left pleural effusion is unchanged. No pneumothorax. Central airways are widely patent. Upper Abdomen: No acute abnormality. Musculoskeletal: Penrose type drain again noted within the inferior left axilla and posterolateral left chest wall, similar prior examination. Near complete resolution of subcutaneous debris and fluid within the left posterolateral chest wall adjacent to the surgical incision noted at axial image # 70. Mottled gas within the high left axilla adjacent to the left axillary vein, axial image # 37, likely represents a a necrotic lymph node and appears stable since prior examination. There is mottled gas and fluid within the joint capsule of the left shoulder, as seen on prior examination, now with extension of the gas in fluid into the subcutaneous if tissues of the left shoulder anteriorly, best appreciated on axial image # 34/2. Fluid is again seen tracking from the a left glenohumeral joint capsule into the subscapular space and extending superiorly to surround the apical left thoracic cage in extend into the supraclavicular region. A pigtail drainage catheter has been placed in this collection and there has been slight interval decrease in the amount of fluid noted superiorly, best appreciated on axial image # 20/2. Right shoulder effusion is again identified. Punctate foci of gas are again seen within the soft tissues anterior to the right shoulder within the pectoralis and deltoid  musculature, similar to that noted on prior examination. IMPRESSION: Near complete resolution of fluid in debris within the inferior, posterolateral left chest wall adjacent to the surgical incision and Penrose type surgical drainage catheter. Stable bulky lymphadenopathy within the left axilla with at least 1 necrotic lymph node noted superiorly, similar to prior examination. Gas and fluid within the left shoulder joint capsule, similar to prior examination, now tracking anteriorly into the subcutaneous soft tissues anterior to the left shoulder as well as, similar prior examination, tracking posterior superiorly into the subscapular space and subsequently into the supraclavicular region. Interval placement of a pigtail type drainage catheter within the supraclavicular region with partial evacuation of fluid within this region. Stable right shoulder effusion and adjacent punctate gas and thickening of the pectoralis and deltoid musculature anterior to the coracoid process. Electronically Signed   By: Helyn NumbersAshesh  Parikh MD   On: 02/18/2021 23:49   CT IMAGE GUIDED DRAINAGE BY PERCUTANEOUS CATHETER  Result Date: 02/16/2021 INDICATION: History of disseminated MSSA infection, now with bilateral paraspinal abscesses. Please perform CT-guided aspiration and/or drainage catheter placement for infection source control purposes. EXAM: CT GUIDED RETROPERITONEAL ABSCESS DRAINAGE CATHETER PLACEMENT X2 COMPARISON:  Normal lumbar spine MRI-02/15/2021 MEDICATIONS: The patient is currently admitted to the hospital and receiving intravenous antibiotics. The antibiotics were administered within an appropriate time frame prior to the initiation of the  procedure. ANESTHESIA/SEDATION: Moderate (conscious) sedation was employed during this procedure. A total of Versed 2 mg and Fentanyl 50 mcg was administered intravenously. Moderate Sedation Time: 14 minutes. The patient's level of consciousness and vital signs were monitored  continuously by radiology nursing throughout the procedure under my direct supervision. CONTRAST:  None COMPLICATIONS: None immediate. PROCEDURE: Informed written consent was obtained from the patient after a discussion of the risks, benefits and alternatives to treatment. The patient was placed prone on the CT gantry and a pre procedural CT was performed re-demonstrating the known abscess/fluid collections within the bilateral iliopsoas musculature with dominant right-sided collection measuring at least 3.5 x 3.0 cm and dominant left-sided component measuring at least 2.5 x 1.7 cm (image 37, series 2). The procedure was planned. A timeout was performed prior to the initiation of the procedure. The skin overlying the operative site was prepped and draped in the usual sterile fashion. The overlying soft tissues were anesthetized with 1% lidocaine with epinephrine. Appropriate trajectory was planned with the use of 22 gauge spinal needles 18 gauge trocar needles were advanced into both retroperitoneal abscesses/fluid collections. Small amount of purulent material was aspirated from both collections in short Amplatz wires were coiled within each collection. Appropriate positioning was confirmed with a limited CT scan. The tract was serially dilated allowing placement of a 10 French all-purpose drainage catheter bilaterally. Appropriate positioning was confirmed with a limited postprocedural CT scan. Approximately 35 cc of purulent fluid was aspirated from the right-sided iliopsoas abscess while approximately 5 cc of bloody fluid was aspirated from the left-sided iliopsoas abscess. Aspirated fluid from the right-sided collection was capped and sent to the laboratory for analysis. Both drainage catheters were connected to JP bulbs and sutured in place. Dressings were applied. The patient tolerated the procedure well without immediate post procedural complication. IMPRESSION: 1. Successful CT guided placement of a 10  [place] all purpose drain catheter into the dominant right-sided retroperitoneal abscess with aspiration of 35 mL of purulent fluid. Samples were sent to the laboratory as requested by the ordering clinical team. 2. Successful CT-guided placement of a 10 French percutaneous drainage catheter into dominant left-sided retroperitoneal abscess with aspiration of approximately 5 cc of bloody fluid. Electronically Signed   By: Simonne Come M.D.   On: 02/16/2021 07:52   CT IMAGE GUIDED DRAINAGE BY PERCUTANEOUS CATHETER  Result Date: 02/16/2021 INDICATION: History of disseminated MSSA infection, now with bilateral paraspinal abscesses. Please perform CT-guided aspiration and/or drainage catheter placement for infection source control purposes. EXAM: CT GUIDED RETROPERITONEAL ABSCESS DRAINAGE CATHETER PLACEMENT X2 COMPARISON:  Normal lumbar spine MRI-02/15/2021 MEDICATIONS: The patient is currently admitted to the hospital and receiving intravenous antibiotics. The antibiotics were administered within an appropriate time frame prior to the initiation of the procedure. ANESTHESIA/SEDATION: Moderate (conscious) sedation was employed during this procedure. A total of Versed 2 mg and Fentanyl 50 mcg was administered intravenously. Moderate Sedation Time: 14 minutes. The patient's level of consciousness and vital signs were monitored continuously by radiology nursing throughout the procedure under my direct supervision. CONTRAST:  None COMPLICATIONS: None immediate. PROCEDURE: Informed written consent was obtained from the patient after a discussion of the risks, benefits and alternatives to treatment. The patient was placed prone on the CT gantry and a pre procedural CT was performed re-demonstrating the known abscess/fluid collections within the bilateral iliopsoas musculature with dominant right-sided collection measuring at least 3.5 x 3.0 cm and dominant left-sided component measuring at least 2.5 x 1.7 cm (image 37,  series 2). The procedure was planned. A timeout was performed prior to the initiation of the procedure. The skin overlying the operative site was prepped and draped in the usual sterile fashion. The overlying soft tissues were anesthetized with 1% lidocaine with epinephrine. Appropriate trajectory was planned with the use of 22 gauge spinal needles 18 gauge trocar needles were advanced into both retroperitoneal abscesses/fluid collections. Small amount of purulent material was aspirated from both collections in short Amplatz wires were coiled within each collection. Appropriate positioning was confirmed with a limited CT scan. The tract was serially dilated allowing placement of a 10 French all-purpose drainage catheter bilaterally. Appropriate positioning was confirmed with a limited postprocedural CT scan. Approximately 35 cc of purulent fluid was aspirated from the right-sided iliopsoas abscess while approximately 5 cc of bloody fluid was aspirated from the left-sided iliopsoas abscess. Aspirated fluid from the right-sided collection was capped and sent to the laboratory for analysis. Both drainage catheters were connected to JP bulbs and sutured in place. Dressings were applied. The patient tolerated the procedure well without immediate post procedural complication. IMPRESSION: 1. Successful CT guided placement of a 10 [place] all purpose drain catheter into the dominant right-sided retroperitoneal abscess with aspiration of 35 mL of purulent fluid. Samples were sent to the laboratory as requested by the ordering clinical team. 2. Successful CT-guided placement of a 10 French percutaneous drainage catheter into dominant left-sided retroperitoneal abscess with aspiration of approximately 5 cc of bloody fluid. Electronically Signed   By: Simonne Come M.D.   On: 02/16/2021 07:52   [place] IMAGE GUIDED FLUID DRAIN BY CATHETER  Result Date: 02/15/2021 INDICATION: History of multifocal MSSA infection with indeterminate  fluid collection with the left supraclavicular fossa. Please perform ultrasound-guided aspiration and/or drainage catheter placement as indicated. EXAM: ULTRASOUND-GUIDED DRAINAGE CATHETER PLACEMENT INTO ABSCESS WITHIN THE LEFT SUPRACLAVICULAR FOSSA COMPARISON:  Chest CT-02/13/2021; 02/09/2021; 02/01/2021 MEDICATIONS: The patient is currently admitted to the hospital and receiving intravenous antibiotics. The antibiotics were administered within an appropriate time frame prior to the initiation of the procedure. ANESTHESIA/SEDATION: Moderate (conscious) sedation was employed during this procedure. A total of Versed 1.5 mg and Fentanyl 50 mcg was administered intravenously. Moderate Sedation Time: 14 minutes. The patient's level of consciousness and vital signs were monitored continuously by radiology nursing throughout the procedure under my direct supervision. CONTRAST:  None COMPLICATIONS: None immediate. PROCEDURE: Informed written consent was obtained from the patient after a discussion of the risks, benefits and alternatives to treatment. Patient was positioned slightly upper right on his hospital bed and sonographic evaluation was performed of the left supraclavicular fossa demonstrating a an indeterminate fluid collection compatible with the findings seen on preceding chest CT. The procedure was planned. A timeout was performed prior to the initiation of the procedure. The left supraclavicular fossa was prepped and draped in the usual sterile fashion. The overlying soft tissues were anesthetized with 1% lidocaine with epinephrine. Appropriate trajectory was planned with the use of a 22 gauge spinal needle. An 18 gauge trocar needle was advanced into the abscess/fluid collection and a short Amplatz super stiff wire was coiled within the collection. Appropriate positioning was confirmed with a limited CT scan. The tract was serially dilated allowing placement of a 10 [place] all-purpose drainage catheter.  Appropriate positioning was confirmed with a limited postprocedural CT scan. Next, approximately 40 cc of bloody fluid was aspirated. The tube was connected to a JP bulb and sutured in place. A dressing was applied. The patient tolerated the  procedure well without immediate post procedural complication. IMPRESSION: Successful ultrasound guided placement of a 10 French all purpose drain catheter into the left supraclavicular abscess with aspiration of 40 mL of bloody fluid. Samples were sent to the laboratory as requested by the ordering clinical team. Electronically Signed   By: Simonne Come M.D.   On: 02/15/2021 17:01    Labs:  CBC: Recent Labs    02/16/21 0438 02/17/21 0546 02/18/21 0508 02/19/21 0431  WBC 9.8 9.6 9.7 8.7  HGB 7.0* 7.5* 7.4* 7.7*  HCT 22.1* 23.1* 22.9* 23.5*  PLT 456* 573* 606* 630*    COAGS: Recent Labs    01/31/21 1059 02/01/21 0022  INR 1.4* 1.3*  APTT 24  --     BMP: Recent Labs    02/25/20 0956 06/23/20 1630 10/09/20 1155 01/31/21 0854 02/16/21 0438 02/17/21 0546 02/18/21 0508 02/19/21 0431  NA 139 138 137   < > 131* 129* 133* 131*  K 5.0 4.6 4.9   < > 4.4 4.3 4.2 4.3  CL 99 97 96   < > 95* 94* 95* 95*  CO2 < > GLUCOSE 216* 413* 308*   < > 138* 115* 128* 100*  BUN < > CALCIUM 9.7 9.6 10.2   < > 8.1* 8.1* 8.3* 8.0*  CREATININE 0.88 1.08 0.89   < > 0.63 0.52* 0.46* 0.48*  GFRNONAA 99 78 98   < > >60 >60 >60 >60  GFRAA 114 91 113  --   --   --   --   --    < > = values in this interval not displayed.    LIVER FUNCTION TESTS: Recent Labs    02/11/21 0539 02/12/21 0535 02/13/21 0551 02/14/21 0212  BILITOT 0.7 0.9 0.7 0.7  AST 25 29 49* 53*  ALT ALKPHOS 55 61 60 53  PROT 4.9* 5.5* 5.2* 5.2*  ALBUMIN 1.9* 2.0* 1.8* 1.8*    Assessment and Plan: 55 yo male with sepsis/MSSA bacteremia secondary to left chest fluid collection/bilateral shoulder septic arthritis despite multiple OR  debridements ; further complicated by development of left neck/supraclavicular fluid collection and bilateral psoas muscle fluid collections concerning for abscesses; s/p drain placement x 3 with Dr. Grace Isaac on 6/23 ; afebrile; WBC nl; hgb stable; creat 0.48, cx- staph; for bilat shoulder washouts tomorrow per ortho; cont current tx for now; once drain OP < 10 cc/24 hr or if WBC increases consider f/u imaging of psoas regions   Electronically Signed: D. Jeananne Rama, PA-C 02/19/2021, 2:47 PM   I spent a total of 15 Minutes at the the patient's bedside AND on the patient's hospital floor or unit, greater than 50% of which was counseling/coordinating care for bilateral psoas and left supraclavicular fossa abscess drains    Patient ID: John Ware, male   DOB: 1965/08/28, 55 y.o.   MRN: [id number]

## 2021-02-19 NOTE — Progress Notes (Signed)
[facility] Infectious Diseases Follow Up Note  Patient Identification: Patient Name: John Ware MRN: [id number] Admit Date: 01/31/2021  7:44 AM Age: 55 y.o.Today's Date: 02/19/2021   Reason for Visit: MSSA infection  Active Problems:   Right knee pain   Diabetes mellitus (HCC)   Hyponatremia   Pressure injury of skin   HTN (hypertension)   Anxiety   Left shoulder pain   Ascending aorta dilatation (HCC)   Vision changes   Endocarditis   Right shoulder pain   Chest wall abscess   Obesity   Normocytic anemia   Thrombocytosis   Psoas muscle abscess (HCC)  Antibiotics: Cefazolin 6/8-current                      Lines/Tubes: PIV's  Interval Events: 1 episode of low-grade fever yesterday.  No leukocytosis.  Hemodynamically stable.  CT chest, right and left shoulder was done, results pending.  Plan for OR on Tuesday.  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  OR cultures 6/16 and 6/18 with MSSA Status post left supraclavicular and rt/ left paraspinal drain by VIR on 6/23.  Cultures growing MSSA  Recommendations Continue cefazolin dosing per pharmacy Follow-up cultures and surgical plans in terms of repeat I&D Monitor CBC and BMP We will follow-up intermittently this week  Rest of the management as per the primary team. Thank you for the consult. Please page with pertinent questions or concerns.  ______________________________________________________________________ Subjective patient seen and examined at the bedside.  He is sitting  up in chair and having breakfast.  He has a hydromorphone drip going on.  He says pain is well controlled.  Denies any nausea vomiting or diarrhea  Vitals BP 106/61 (BP Location: Left Arm)   Pulse 97   Temp 99.8 F (37.7 C) (Oral)   Resp 16   Ht 5\' 10"  (1.778 m)   Wt 97.3 kg   SpO2 98%   BMI 30.78 kg/m     Physical Exam Constitutional: Sitting up in chair, having breakfast    Comments:   Cardiovascular:     Rate and Rhythm: Normal rate and regular rhythm.     Heart sounds:  Pulmonary:     Effort: Pulmonary effort is normal.     Comments:   Abdominal:     Palpations: Abdomen is soft.     Tenderness: Nontender and nondistended  Musculoskeletal:        General: Bilateral shoulders with minimal tenderness/swelling, limited range of motion, wound VAC in place Skin:    Comments: No lesions or rashes  Neurological:     General: No focal deficit present.   Psychiatric:        Mood and Affect: Mood normal.     Pertinent Microbiology Results for orders placed or performed during the hospital encounter of 01/31/21  Urine culture     Status: Abnormal   Collection Time: 01/31/21  8:51 AM   Specimen: In/Out Cath Urine  Result Value Ref Range Status   Specimen Description   Final    IN/OUT CATH URINE Performed at [facility], [address], [place], [place]    Special Requests  Final    NONE Performed at [facility], [address], [place], [place] [address]    Culture >=100,000 COLONIES/mL STAPHYLOCOCCUS AUREUS (A)  Final   Report Status 02/02/2021 FINAL  Final   Organism ID, Bacteria STAPHYLOCOCCUS AUREUS (A)  Final      Susceptibility   Staphylococcus aureus - MIC*    CIPROFLOXACIN >=8 RESISTANT Resistant     GENTAMICIN <=0.5 SENSITIVE Sensitive     NITROFURANTOIN <=16 SENSITIVE Sensitive     OXACILLIN 0.5 SENSITIVE Sensitive     TETRACYCLINE <=1 SENSITIVE Sensitive     VANCOMYCIN 1 SENSITIVE Sensitive      TRIMETH/SULFA <=10 SENSITIVE Sensitive     CLINDAMYCIN <=0.25 SENSITIVE Sensitive     RIFAMPIN <=0.5 SENSITIVE Sensitive     Inducible Clindamycin NEGATIVE Sensitive     * >=100,000 COLONIES/mL STAPHYLOCOCCUS AUREUS  Resp Panel by RT-PCR (Flu A&B, Covid) Nasopharyngeal Swab     Status: Abnormal   Collection Time: 01/31/21 10:59 AM   Specimen: Nasopharyngeal Swab; Nasopharyngeal(NP) swabs in vial transport medium  Result Value Ref Range Status   SARS Coronavirus 2 by RT PCR POSITIVE (A) NEGATIVE Final    Comment: RESULT CALLED TO, READ BACK BY AND VERIFIED WITH: GARRISON,G. RN AT 1206 01/31/21 MULLINS,T (NOTE) SARS-CoV-2 target nucleic acids are DETECTED.  The SARS-CoV-2 RNA is generally detectable in upper respiratory specimens during the acute phase of infection. Positive results are indicative of the presence of the identified virus, but do not rule out bacterial infection or co-infection with other pathogens not detected by the test. Clinical correlation with patient history and other diagnostic information is necessary to determine patient infection status. The expected result is Negative.  Fact Sheet for Patients: BloggerCourse.com  Fact Sheet for Healthcare Providers: SeriousBroker.it  This test is not yet approved or cleared by the [place] FDA and  has been authorized for detection and/or diagnosis of SARS-CoV-2 by FDA under an Emergency Use Authorization (EUA).  This EUA will remain in effect (meaning this tes t can be used) for the duration of  the COVID-19 declaration under Section 564(b)(1) of the Act, 21 U.S.C. section 360bbb-3(b)(1), unless the authorization is terminated or revoked sooner.     Influenza A by PCR NEGATIVE NEGATIVE Final   Influenza B by PCR NEGATIVE NEGATIVE Final    Comment: (NOTE) The Xpert Xpress SARS-CoV-2/FLU/RSV plus assay is intended as an aid in the diagnosis of influenza from  Nasopharyngeal swab specimens and should not be used as a sole basis for treatment. Nasal washings and aspirates are unacceptable for Xpert Xpress SARS-CoV-2/FLU/RSV testing.  Fact Sheet for Patients: BloggerCourse.com  Fact Sheet for Healthcare Providers: SeriousBroker.it  This test is not yet approved or cleared by the [place] FDA and has been authorized for detection and/or diagnosis of SARS-CoV-2 by FDA under an Emergency Use Authorization (EUA). This EUA will remain in effect (meaning this test can be used) for the duration of the COVID-19 declaration under Section 564(b)(1) of the Act, 21 U.S.C. section 360bbb-3(b)(1), unless the authorization is terminated or revoked.  Performed at [facility], [address], [place], [place] [address]   Blood Culture (routine x 2)     Status: Abnormal   Collection Time: 01/31/21 10:59 AM   Specimen: BLOOD  Result Value Ref Range Status   Specimen Description   Final    BLOOD LEFT ANTECUBITAL Performed at [facility], [address] [place], [place] [address]  Special Requests   Final    BOTTLES DRAWN AEROBIC AND ANAEROBIC Blood Culture adequate volume Performed at [facility], [address] [place], [place] [id number]    Culture  Setup Time   Final    GRAM POSITIVE COCCI IN CLUSTERS IN BOTH AEROBIC AND ANAEROBIC BOTTLES CRITICAL RESULT CALLED TO, READ BACK BY AND VERIFIED WITH: PHARMD MICHELLE L. [id number] FCP Performed at [facility], [address] [place], [place] [address]    Culture STAPHYLOCOCCUS AUREUS (A)  Final   Report Status 02/04/2021 FINAL  Final   Organism ID, Bacteria STAPHYLOCOCCUS AUREUS  Final      Susceptibility   Staphylococcus aureus - MIC*    CIPROFLOXACIN >=8 RESISTANT Resistant     ERYTHROMYCIN >=8 RESISTANT Resistant     GENTAMICIN <=0.5 SENSITIVE Sensitive     OXACILLIN 0.5  SENSITIVE Sensitive     TETRACYCLINE <=1 SENSITIVE Sensitive     VANCOMYCIN <=0.5 SENSITIVE Sensitive     TRIMETH/SULFA <=10 SENSITIVE Sensitive     CLINDAMYCIN <=0.25 SENSITIVE Sensitive     RIFAMPIN <=0.5 SENSITIVE Sensitive     Inducible Clindamycin NEGATIVE Sensitive     * STAPHYLOCOCCUS AUREUS  Blood Culture ID Panel (Reflexed)     Status: Abnormal   Collection Time: 01/31/21 10:59 AM  Result Value Ref Range Status   Enterococcus faecalis NOT DETECTED NOT DETECTED Final   Enterococcus Faecium NOT DETECTED NOT DETECTED Final   Listeria monocytogenes NOT DETECTED NOT DETECTED Final   Staphylococcus species DETECTED (A) NOT DETECTED Final    Comment: CRITICAL RESULT CALLED TO, READ BACK BY AND VERIFIED WITH: PHARMD MICHELLE L. [id number] FCP    Staphylococcus aureus (BCID) DETECTED (A) NOT DETECTED Final    Comment: CRITICAL RESULT CALLED TO, READ BACK BY AND VERIFIED WITH: PHARMD MICHELLE L. [id number] FCP    Staphylococcus epidermidis NOT DETECTED NOT DETECTED Final   Staphylococcus lugdunensis NOT DETECTED NOT DETECTED Final   Streptococcus species NOT DETECTED NOT DETECTED Final   Streptococcus agalactiae NOT DETECTED NOT DETECTED Final   Streptococcus pneumoniae NOT DETECTED NOT DETECTED Final   Streptococcus pyogenes NOT DETECTED NOT DETECTED Final   A.calcoaceticus-baumannii NOT DETECTED NOT DETECTED Final   Bacteroides fragilis NOT DETECTED NOT DETECTED Final   Enterobacterales NOT DETECTED NOT DETECTED Final   Enterobacter cloacae complex NOT DETECTED NOT DETECTED Final   Escherichia coli NOT DETECTED NOT DETECTED Final   Klebsiella aerogenes NOT DETECTED NOT DETECTED Final   Klebsiella oxytoca NOT DETECTED NOT DETECTED Final   Klebsiella pneumoniae NOT DETECTED NOT DETECTED Final   Proteus species NOT DETECTED NOT DETECTED Final   Salmonella species NOT DETECTED NOT DETECTED Final   Serratia marcescens NOT DETECTED NOT DETECTED Final   Haemophilus influenzae NOT  DETECTED NOT DETECTED Final   Neisseria meningitidis NOT DETECTED NOT DETECTED Final   Pseudomonas aeruginosa NOT DETECTED NOT DETECTED Final   Stenotrophomonas maltophilia NOT DETECTED NOT DETECTED Final   Candida albicans NOT DETECTED NOT DETECTED Final   Candida auris NOT DETECTED NOT DETECTED Final   Candida glabrata NOT DETECTED NOT DETECTED Final   Candida krusei NOT DETECTED NOT DETECTED Final   Candida parapsilosis NOT DETECTED NOT DETECTED Final   Candida tropicalis NOT DETECTED NOT DETECTED Final   Cryptococcus neoformans/gattii NOT DETECTED NOT DETECTED Final   Meth resistant mecA/C and MREJ NOT DETECTED NOT DETECTED Final    Comment: Performed at [facility], [address] [place],  [place] [address]  Blood Culture (routine x 2)     Status: Abnormal   Collection Time: 01/31/21 11:15 AM   Specimen: BLOOD  Result Value Ref Range Status   Specimen Description   Final    BLOOD RIGHT ANTECUBITAL Performed at [facility], [address] [place], [place] [address]    Special Requests   Final    BOTTLES DRAWN AEROBIC AND ANAEROBIC Blood Culture adequate volume Performed at [facility], [address] [place], [place] [address]    Culture  Setup Time   Final    GRAM POSITIVE COCCI IN CLUSTERS IN BOTH AEROBIC AND ANAEROBIC BOTTLES CRITICAL VALUE NOTED.  VALUE IS CONSISTENT WITH PREVIOUSLY REPORTED AND CALLED VALUE.    Culture (A)  Final    STAPHYLOCOCCUS AUREUS SUSCEPTIBILITIES PERFORMED ON PREVIOUS CULTURE WITHIN THE LAST 5 DAYS. Performed at [facility], [address] [place], [place] [address]    Report Status 02/03/2021 FINAL  Final  MRSA PCR Screening     Status: None   Collection Time: 01/31/21  2:28 PM   Specimen: Nasal Mucosa; Nasopharyngeal  Result Value Ref Range Status   MRSA by PCR NEGATIVE NEGATIVE Final    Comment:        The GeneXpert MRSA Assay (FDA approved for NASAL specimens only), is one  component of a comprehensive MRSA colonization surveillance program. It is not intended to diagnose MRSA infection nor to guide or monitor treatment for MRSA infections. Performed at [facility], [address], [place], [place] [address]   Culture, blood (routine x 2)     Status: None   Collection Time: 02/01/21  2:20 PM   Specimen: BLOOD  Result Value Ref Range Status   Specimen Description   Final    BLOOD RIGHT ANTECUBITAL Performed at [facility], [address], [place], [place] [address]    Special Requests   Final    BOTTLES DRAWN AEROBIC AND ANAEROBIC Blood Culture adequate volume Performed at [facility], [address], [place], [place] [address]    Culture   Final    NO GROWTH 5 DAYS Performed at  [facility], [address] [place], [place] [address]    Report Status 02/06/2021 FINAL  Final  Culture, blood (routine x 2)     Status: None   Collection Time: 02/01/21  2:20 PM   Specimen: BLOOD RIGHT HAND  Result Value Ref Range Status   Specimen Description   Final    BLOOD RIGHT HAND Performed at [facility], [address] [place], [place] [address]    Special Requests   Final    BOTTLES DRAWN AEROBIC AND ANAEROBIC Blood Culture adequate volume Performed at [facility], [address] [place], [place] [address]    Culture   Final    NO GROWTH 5 DAYS Performed at [facility], [address], [place], [place] [address]    Report Status 02/06/2021 FINAL  Final  Aerobic/Anaerobic Culture w Gram Stain (surgical/deep wound)     Status: None   Collection Time: 02/02/21  4:12 PM   Specimen: Abscess  Result Value Ref Range Status   Specimen Description   Final    ABSCESS SUBSCAPULAR Performed at [facility], [address] [place], [place] [address]    Special Requests   Final    NONE Performed at [facility],  [address], [place], [place] [address]    Gram Stain   Final    ABUNDANT WBC PRESENT, PREDOMINANTLY PMN ABUNDANT GRAM POSITIVE COCCI IN CLUSTERS IN TETRADS    Culture   Final    ABUNDANT STAPHYLOCOCCUS AUREUS NO ANAEROBES ISOLATED Performed at [facility], [address], [place], [place] [address]    Report Status 02/07/2021 FINAL  Final   Organism ID, Bacteria STAPHYLOCOCCUS AUREUS  Final      Susceptibility   Staphylococcus aureus - MIC*    CIPROFLOXACIN >=8 RESISTANT Resistant     ERYTHROMYCIN >=8 RESISTANT Resistant     GENTAMICIN <=0.5 SENSITIVE Sensitive     OXACILLIN 0.5 SENSITIVE Sensitive     TETRACYCLINE <=1 SENSITIVE Sensitive     VANCOMYCIN <=0.5 SENSITIVE Sensitive     TRIMETH/SULFA <=10 SENSITIVE Sensitive     CLINDAMYCIN <=0.25 SENSITIVE Sensitive     RIFAMPIN <=0.5 SENSITIVE Sensitive     Inducible Clindamycin NEGATIVE Sensitive     * ABUNDANT STAPHYLOCOCCUS AUREUS  Fungus Culture With Stain     Status: None (Preliminary result)   Collection Time: 02/08/21  6:48 PM   Specimen: Synovial, Left Shoulder; Body Fluid  Result Value Ref Range Status   Fungus Stain Final report  Final    Comment: (NOTE) Performed At: [facility] [address] [place], [place] [id number] Jolene Schimke MD BT:[id number]    Fungus (Mycology) Culture PENDING  Incomplete   Fungal Source SHOULDER  Final    Comment: LEFT Performed at [facility], [address] [place], [place] [address]   Aerobic/Anaerobic Culture w Gram Stain (surgical/deep wound)     Status: None   Collection Time: 02/08/21  6:48 PM   Specimen: Synovial, Left Shoulder; Body Fluid  Result Value Ref Range Status   Specimen Description   Final    SHOULDER LEFT Performed at [facility], [address], [place], [place] [address]    Special Requests   Final    NONE Performed at [facility], [address], [place],  [place] [address]    Gram Stain   Final    ABUNDANT WBC PRESENT,BOTH PMN AND MONONUCLEAR RARE GRAM POSITIVE COCCI IN PAIRS    Culture   Final    RARE STAPHYLOCOCCUS AUREUS NO ANAEROBES ISOLATED Performed at [facility], [address] [place], [place] [address]    Report Status 02/14/2021 FINAL  Final   Organism ID, Bacteria STAPHYLOCOCCUS AUREUS  Final      Susceptibility   Staphylococcus aureus - MIC*    CIPROFLOXACIN >=8 RESISTANT Resistant     ERYTHROMYCIN >=8 RESISTANT Resistant     GENTAMICIN <=0.5 SENSITIVE Sensitive     OXACILLIN 0.5 SENSITIVE Sensitive     TETRACYCLINE <=1 SENSITIVE Sensitive     VANCOMYCIN 1 SENSITIVE Sensitive     TRIMETH/SULFA <=10 SENSITIVE Sensitive     CLINDAMYCIN <=0.25 SENSITIVE Sensitive     RIFAMPIN <=0.5 SENSITIVE Sensitive     Inducible Clindamycin NEGATIVE Sensitive     * RARE STAPHYLOCOCCUS AUREUS  Fungus Culture Result     Status: None   Collection Time: 02/08/21  6:48 PM  Result Value Ref Range Status   Result 1 Comment  Final    Comment: (NOTE) KOH/Calcofluor preparation:  no fungus observed. Performed At: [facility] [address] [place], [place] [id number] Jolene Schimke MD [id number]   Fungus Culture With Stain     Status: None (  Preliminary result)   Collection Time: 02/08/21  6:52 PM   Specimen: Joint, Other; Body Fluid  Result Value Ref Range Status   Fungus Stain Final report  Final    Comment: (NOTE) Performed At: [facility] [address] [place], [place] [id number] Jolene Schimke MD [id number]    Fungus (Mycology) Culture PENDING  Incomplete   Fungal Source SHOULDER  Final    Comment: LEFT BONE CHIPS Performed at [facility], [address] [place], [place] [address]   Aerobic/Anaerobic Culture w Gram Stain (surgical/deep wound)     Status: None   Collection Time: 02/08/21  6:52 PM   Specimen: Joint, Other; Body Fluid  Result Value Ref Range Status   Specimen  Description   Final    SHOULDER LEFT BONE CHIPS Performed at [facility], [address], [place], [place] [address]    Special Requests   Final    NONE Performed at [facility], [address], [place], [place] [address]    Gram Stain NO WBC SEEN NO ORGANISMS SEEN   Final   Culture   Final    RARE STAPHYLOCOCCUS AUREUS NO ANAEROBES ISOLATED Performed at [facility], [address], [place], [place] [address]    Report Status 02/14/2021 FINAL  Final   Organism ID, Bacteria STAPHYLOCOCCUS AUREUS  Final      Susceptibility   Staphylococcus aureus - MIC*    CIPROFLOXACIN >=8 RESISTANT Resistant     ERYTHROMYCIN >=8 RESISTANT Resistant     GENTAMICIN <=0.5 SENSITIVE Sensitive     OXACILLIN 0.5 SENSITIVE Sensitive     TETRACYCLINE <=1 SENSITIVE Sensitive     VANCOMYCIN 1 SENSITIVE Sensitive     TRIMETH/SULFA <=10 SENSITIVE Sensitive     CLINDAMYCIN <=0.25 SENSITIVE Sensitive     RIFAMPIN <=0.5 SENSITIVE Sensitive     Inducible Clindamycin NEGATIVE Sensitive     * RARE STAPHYLOCOCCUS AUREUS  Fungus Culture Result     Status: None   Collection Time: 02/08/21  6:52 PM  Result Value Ref Range Status   Result 1 Comment  Final    Comment: (NOTE) KOH/Calcofluor preparation:  no fungus observed. Performed At: [facility] [address] [place], [place] [id number] Jolene Schimke MD QI:[id number]   Aerobic/Anaerobic Culture w Gram Stain (surgical/deep wound)     Status: None   Collection Time: 02/10/21 10:22 AM   Specimen: Wound  Result Value Ref Range Status   Specimen Description   Final    WOUND CHEST Performed at [facility], [address], [place], [place] [address]    Special Requests   Final    NONE Performed at [facility], [address], [place], [place] [address]    Gram Stain   Final    ABUNDANT WBC PRESENT,BOTH PMN AND MONONUCLEAR MODERATE GRAM POSITIVE COCCI     Culture   Final    ABUNDANT STAPHYLOCOCCUS AUREUS NO ANAEROBES ISOLATED Performed at [facility], [address], [place], [place] [address]    Report Status 02/15/2021 FINAL  Final   Organism ID, Bacteria STAPHYLOCOCCUS AUREUS  Final      Susceptibility   Staphylococcus aureus - MIC*    CIPROFLOXACIN >=8 RESISTANT Resistant     ERYTHROMYCIN >=8 RESISTANT Resistant     GENTAMICIN <=0.5 SENSITIVE Sensitive     OXACILLIN 0.5 SENSITIVE Sensitive     TETRACYCLINE <=1 SENSITIVE Sensitive     VANCOMYCIN 1  SENSITIVE Sensitive     TRIMETH/SULFA <=10 SENSITIVE Sensitive     CLINDAMYCIN <=0.25 SENSITIVE Sensitive     RIFAMPIN <=0.5 SENSITIVE Sensitive     Inducible Clindamycin NEGATIVE Sensitive     * ABUNDANT STAPHYLOCOCCUS AUREUS  Culture, blood (single)     Status: None   Collection Time: 02/13/21  5:51 AM   Specimen: BLOOD  Result Value Ref Range Status   Specimen Description   Final    BLOOD RIGHT ANTECUBITAL Performed at [facility], [address] [place], [place] [address]    Special Requests   Final    BOTTLES DRAWN AEROBIC ONLY Blood Culture adequate volume Performed at [facility], [address], [place], [place] [address]    Culture   Final    NO GROWTH 5 DAYS Performed at [facility], [address], [place], [place] [address]    Report Status 02/18/2021 FINAL  Final  Culture, blood (single)     Status: None   Collection Time: 02/14/21  2:12 AM   Specimen: BLOOD  Result Value Ref Range Status   Specimen Description   Final    BLOOD RIGHT ANTECUBITAL Performed at [facility] [address] [place], [place] [address]    Special Requests   Final    BOTTLES DRAWN AEROBIC AND ANAEROBIC Blood Culture adequate volume Performed at [facility], [address] [place], [place] [address]    Culture   Final    NO GROWTH 5 DAYS Performed at [facility], [address], [place], [place] [address]    Report Status 02/19/2021 FINAL  Final  Aerobic/Anaerobic Culture w Gram Stain (surgical/deep wound)     Status: None (Preliminary result)   Collection Time: 02/15/21  5:02 PM   Specimen: Abscess  Result Value Ref Range Status   Specimen Description   Final    ABSCESS LEFT SHOULDER Performed at [facility], [address], [place], [place] [address]    Special Requests   Final    NONE Performed at [facility], [address], [place], [place] [address]    Gram Stain   Final    NO WBC SEEN NO ORGANISMS SEEN Performed at [facility], [address] [place], [place] [address]    Culture   Final    FEW STAPHYLOCOCCUS AUREUS NO ANAEROBES ISOLATED; CULTURE IN PROGRESS FOR 5 DAYS    Report Status PENDING  Incomplete  Aerobic/Anaerobic Culture w Gram Stain (surgical/deep wound)     Status: None (Preliminary result)   Collection Time: 02/15/21  5:06 PM   Specimen: Abscess  Result Value Ref Range Status   Specimen Description   Final    ABSCESS POST US GUIDED LEFT SUPRAVICULAR ABSCESS DRAIN PLCEMENT Performed at [facility], [address] [place], [place] [address]    Special Requests   Final    NONE Performed at [facility], [address], [place], [place] [address]    Gram Stain   Final    FEW WBC PRESENT,BOTH PMN AND MONONUCLEAR FEW GRAM POSITIVE COCCI IN PAIRS Performed at [facility], [address], [place], [place] [address]    Culture   Final    ABUNDANT STAPHYLOCOCCUS AUREUS NO ANAEROBES ISOLATED; CULTURE IN PROGRESS FOR 5 DAYS    Report Status PENDING  Incomplete   Organism ID, Bacteria STAPHYLOCOCCUS AUREUS  Final  Susceptibility   Staphylococcus aureus - MIC*    CIPROFLOXACIN >=8 RESISTANT Resistant     ERYTHROMYCIN >=8 RESISTANT Resistant     GENTAMICIN <=0.5 SENSITIVE Sensitive     OXACILLIN 0.5 SENSITIVE Sensitive     TETRACYCLINE  <=1 SENSITIVE Sensitive     VANCOMYCIN <=0.5 SENSITIVE Sensitive     TRIMETH/SULFA <=10 SENSITIVE Sensitive     CLINDAMYCIN <=0.25 SENSITIVE Sensitive     RIFAMPIN <=0.5 SENSITIVE Sensitive     Inducible Clindamycin NEGATIVE Sensitive     * ABUNDANT STAPHYLOCOCCUS AUREUS     Pertinent Lab. CBC Latest Ref Rng & Units 02/19/2021 02/18/2021 02/17/2021  WBC 4.0 - 10.5 K/uL 8.7 9.7 9.6  Hemoglobin 13.0 - 17.0 g/dL 7.7(L) 7.4(L) 7.5(L)  Hematocrit 39.0 - 52.0 % 23.5(L) 22.9(L) 23.1(L)  Platelets 150 - 400 K/uL 630(H) 606(H) 573(H)   CMP Latest Ref Rng & Units 02/19/2021 02/18/2021 02/17/2021  Glucose 70 - 99 mg/dL 161(W) 960(A) 540(J)  BUN 6 - 20 mg/dL Creatinine 0.61 - 1.24 mg/dL 8.11(B) 1.47(W) 2.95(A)  Sodium 135 - 145 mmol/L 131(L) 133(L) 129(L)  Potassium 3.5 - 5.1 mmol/L 4.3 4.2 4.3  Chloride 98 - 111 mmol/L 95(L) 95(L) 94(L)  CO2 22 - 32 mmol/L Calcium 8.9 - 10.3 mg/dL 8.0(L) 8.3(L) 8.1(L)  Total Protein 6.5 - 8.1 g/dL - - -  Total Bilirubin 0.3 - 1.2 mg/dL - - -  Alkaline Phos 38 - 126 U/L - - -  AST 15 - 41 U/L - - -  ALT 0 - 44 U/L - - -     Pertinent Imaging today Plain films and CT images have been personally visualized and interpreted; radiology reports have been reviewed. Decision making incorporated into the Impression / Recommendations.  CT Chest/rt and left shoulder  IMPRESSION: Near complete resolution of fluid in debris within the inferior, posterolateral left chest wall adjacent to the surgical incision and Penrose type surgical drainage catheter.   Stable bulky lymphadenopathy within the left axilla with at least 1 necrotic lymph node noted superiorly, similar to prior examination.   Gas and fluid within the left shoulder joint capsule, similar to prior examination, now tracking anteriorly into the subcutaneous soft tissues anterior to the left shoulder as well as, similar prior examination, tracking posterior superiorly into the  subscapular space and subsequently into the supraclavicular region. Interval placement of a pigtail type drainage catheter within the supraclavicular region with partial evacuation of fluid within this region.   Stable right shoulder effusion and adjacent punctate gas and thickening of the pectoralis and deltoid musculature anterior to the coracoid process.        I have spent more than 35 minutes for this patient encounter including review of prior medical records, coordination of care  with greater than 50% of time being face to face/counseling and discussing diagnostics/treatment plan with the patient/family.  Electronically signed by:   Odette Fraction, MD Infectious Disease Physician [facility] Pager: [phone]

## 2021-02-19 NOTE — Consult Note (Addendum)
WOC Nurse wound follow up Stopped by pt's room to inform him we will be changing the Vac dressing later today when Dr Daphine Deutscher is available to assess the wound.  Pt became agitated and upset, stating, " I am not going through that again. They need to change my dressing in the OR tomorrow." Informed patient that he plans to go to the OR tomorrow with the orthopedic team for bilat shoulders, and today the general surgical team plans to change the dressing and assess the chest wound. He again stated, " I need to be knocked out if they are going to change the dressing." Informed patient if he plans to discharge with home health, he will not be able to be "knocked out" for the Vac dressing changes, and will not even be able to take IV pain meds after discharge.  Pt states he will "discuss the plans further when the surgeon comes to see him today. " Cammie Mcgee MSN, RN, CWOCN, [place], CNS [phone]

## 2021-02-19 NOTE — Progress Notes (Signed)
Subjective:   Patient is alert, oriented. States shoulder pain is steadily improving, left is still worse than right.   Objective:  PE: VITALS:   Vitals:   02/18/21 2327 02/19/21 0034 02/19/21 0332 02/19/21 0830  BP:  106/61    Pulse:  97    Resp: 18 18 14 16   Temp:  99.8 F (37.7 C)    TempSrc:  Oral    SpO2: 98% 97% 98% 98%  Weight:      Height:        0-20 degrees of active forward flexion of right shoulder, 0-10 deg left shoulder, limited by pain. No erythema. No drainage seen on dressings. Clean dry dressings in place. Distal sensation and capillary refill intact.   LABS  Results for orders placed or performed during the hospital encounter of 01/31/21 (from the past 24 hour(s))  Glucose, capillary     Status: Abnormal   Collection Time: 02/18/21 11:46 AM  Result Value Ref Range   Glucose-Capillary 146 (H) 70 - 99 mg/dL  Glucose, capillary     Status: None   Collection Time: 02/18/21  4:19 PM  Result Value Ref Range   Glucose-Capillary 71 70 - 99 mg/dL  Glucose, capillary     Status: Abnormal   Collection Time: 02/18/21 10:14 PM  Result Value Ref Range   Glucose-Capillary 109 (H) 70 - 99 mg/dL  CBC with Differential/Platelet     Status: Abnormal   Collection Time: 02/19/21  4:31 AM  Result Value Ref Range   WBC 8.7 4.0 - 10.5 K/uL   RBC 2.54 (L) 4.22 - 5.81 MIL/uL   Hemoglobin 7.7 (L) 13.0 - 17.0 g/dL   HCT 02/21/21 (L) 73.2 - 20.2 %   MCV 92.5 80.0 - 100.0 fL   MCH 30.3 26.0 - 34.0 pg   MCHC 32.8 30.0 - 36.0 g/dL   RDW 54.2 70.6 - 23.7 %   Platelets 630 (H) 150 - 400 K/uL   nRBC 0.0 0.0 - 0.2 %   Neutrophils Relative % 70 %   Neutro Abs 6.2 1.7 - 7.7 K/uL   Lymphocytes Relative 16 %   Lymphs Abs 1.4 0.7 - 4.0 K/uL   Monocytes Relative 10 %   Monocytes Absolute 0.9 0.1 - 1.0 K/uL   Eosinophils Relative 2 %   Eosinophils Absolute 0.2 0.0 - 0.5 K/uL   Basophils Relative 1 %   Basophils Absolute 0.0 0.0 - 0.1 K/uL   Immature Granulocytes 1 %   Abs  Immature Granulocytes 0.05 0.00 - 0.07 K/uL  Basic metabolic panel     Status: Abnormal   Collection Time: 02/19/21  4:31 AM  Result Value Ref Range   Sodium 131 (L) 135 - 145 mmol/L   Potassium 4.3 3.5 - 5.1 mmol/L   Chloride 95 (L) 98 - 111 mmol/L   CO2 28 22 - 32 mmol/L   Glucose, Bld 100 (H) 70 - 99 mg/dL   BUN 6 6 - 20 mg/dL   Creatinine, Ser 02/21/21 (L) 0.61 - 1.24 mg/dL   Calcium 8.0 (L) 8.9 - 10.3 mg/dL   GFR, Estimated 3.15 >17 mL/min   Anion gap 8 5 - 15  Magnesium     Status: None   Collection Time: 02/19/21  4:31 AM  Result Value Ref Range   Magnesium 1.7 1.7 - 2.4 mg/dL  Glucose, capillary     Status: Abnormal   Collection Time: 02/19/21  8:12 AM  Result Value Ref Range  Glucose-Capillary 111 (H) 70 - 99 mg/dL    CT CHEST WO CONTRAST  Result Date: 02/18/2021 CLINICAL DATA:  Left axillary abscess status post incision and drainage. Follow-up examination. EXAM: CT CHEST WITHOUT CONTRAST TECHNIQUE: Multidetector CT imaging of the chest was performed following the standard protocol without IV contrast. COMPARISON:  02/13/2021, 02/09/2021 FINDINGS: Cardiovascular: No significant coronary artery calcification. Global cardiac size within normal limits. Hypoattenuation of the cardiac blood pool is again identified and is unchanged, in keeping with at least mild anemia. No pericardial effusion. Central pulmonary arteries are of normal caliber. Thoracic aorta is unremarkable. Mediastinum/Nodes: Bulky adenopathy within the left axilla is unchanged. No mediastinal or hilar adenopathy identified. No supraclavicular adenopathy identified though multiple small shotty left supraclavicular lymph nodes are identified, possibly reactive in nature. Visualized thyroid unremarkable. The esophagus is unremarkable. Lungs/Pleura: Mild bibasilar atelectasis.  Stable pulmonary nodules: 7 mm, subpleural left upper lobe, axial image # 110/7 5 mm, left upper lobe, axial image # 130/7 9 mm, lingula, axial image  # 139/7 Small left pleural effusion is unchanged. No pneumothorax. Central airways are widely patent. Upper Abdomen: No acute abnormality. Musculoskeletal: Penrose type drain again noted within the inferior left axilla and posterolateral left chest wall, similar prior examination. Near complete resolution of subcutaneous debris and fluid within the left posterolateral chest wall adjacent to the surgical incision noted at axial image # 70. Mottled gas within the high left axilla adjacent to the left axillary vein, axial image # 37, likely represents a a necrotic lymph node and appears stable since prior examination. There is mottled gas and fluid within the joint capsule of the left shoulder, as seen on prior examination, now with extension of the gas in fluid into the subcutaneous if tissues of the left shoulder anteriorly, best appreciated on axial image # 34/2. Fluid is again seen tracking from the a left glenohumeral joint capsule into the subscapular space and extending superiorly to surround the apical left thoracic cage in extend into the supraclavicular region. A pigtail drainage catheter has been placed in this collection and there has been slight interval decrease in the amount of fluid noted superiorly, best appreciated on axial image # 20/2. Right shoulder effusion is again identified. Punctate foci of gas are again seen within the soft tissues anterior to the right shoulder within the pectoralis and deltoid musculature, similar to that noted on prior examination. IMPRESSION: Near complete resolution of fluid in debris within the inferior, posterolateral left chest wall adjacent to the surgical incision and Penrose type surgical drainage catheter. Stable bulky lymphadenopathy within the left axilla with at least 1 necrotic lymph node noted superiorly, similar to prior examination. Gas and fluid within the left shoulder joint capsule, similar to prior examination, now tracking anteriorly into the  subcutaneous soft tissues anterior to the left shoulder as well as, similar prior examination, tracking posterior superiorly into the subscapular space and subsequently into the supraclavicular region. Interval placement of a pigtail type drainage catheter within the supraclavicular region with partial evacuation of fluid within this region. Stable right shoulder effusion and adjacent punctate gas and thickening of the pectoralis and deltoid musculature anterior to the coracoid process. 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 NPO after midnight - WBAT bilateral shoulders, can use slings for comfort if he likes  Contact information:   Weekdays 8-5 Janine Ores, PA-C [phone] A fter hours and holidays please check [email or web address] for group call information for [facility]  Armida Sans 02/19/2021,

## 2021-02-19 NOTE — Progress Notes (Signed)
Progress Note    John Ware   EHO:[id number]  DOB: July 15, 1966  DOA: 01/31/2021     19  PCP: Sharion Balloon, FNP  CC: Weakness, ongoing pain  Hospital Course: John Ware is a 55 y.o. male with medical history significant of chronic pain, HTN, DM2.  He had at home positive COVID test on 01/12/2021.  He had no respiratory symptoms at this time but he did have fevers that was responsive to Tylenol.  Reportedly he apparently fell out of his chair and hurt his left shoulder and he was evaluated by the ED and they found no fracture and he was given a sling and told to follow-up with orthopedics.  Since that time the patient's wife reports that he has had general body aches and pain and has increased symptoms that are not responsive to his chronic pain meds.  He is also had poor appetite during this time and has been unable to walk for last few days.  Because of these constantly some symptoms he is brought to the ED for further evaluation.  In the ED is found to have a sodium of 113 and a glucose of 339 and a WBC of 20.5 as well as a lactic acid of 3.8.  He started on broad-spectrum antibiotics and given fluids.  Soon after his blood cultures became positive for 4-4 MSSA.  He was transitioned to IV Ancef.  With further work-up and confirmation he was also found to have a large fluid collection of left lateral posterior chest and back.  He did have tenderness in his back.  He underwent MRI T-spine and CT of the chest abdomen pelvis.  There is no evidence of any osteomyelitis or discitis or other underlying infection on MRI T-spine.  CT of the chest did show a large multiloculated fluid collection.  IR was consulted and he underwent an ultrasound-guided aspiration was sent for culturing.  CT was concerning for evidence of septic emboli but 2D echo did not reveal any evidence of an endocarditis.  Cardiology was consulted for TEE per ID recommendations and he underwent TEE on 02/05/2021 which found a  large tricuspid valve vegetation with mild tricuspid regurg.  Subsequently he still complained of some shoulder pain so he underwent bilateral MRIs of his shoulders and he is still having fevers.  At the left shoulder was concerned for septic arthritis and left axillary abscess.  Orthopedics was reconsulted as well as IR and the orthopedic surgeon is planning to do a washout tomorrow in edition IR is going to place a drain on the residual fluid collection that may exist after washout.  Because of the surgery ID changed the patient to nafcillin however recommending continuing cefazolin at this time. ESR and CRP and both are elevated.   Patient underwent on 02/08/21 left shoulder excisional debridement, deep abscess with arthrotomy and irrigation debridement of the joint with biceps tenotomy and excision of the necrotic rotator cuff.  Patient is not medically ready to be D/C'd to CIR.   Postoperatively he had a CT scan and repeated and showed a marked increase in size of the abscess collection at the left lateral chest wall with persistent abscess again seen in the subscapular extending cranial to the apex of the left hemithorax.  Since he is post I&D it was unclear about how much of the increase in size of the abscess was due to the postsurgical changes progression of the abscess collection.  Patient did have some small bilateral pleural  effusions and bibasilar atelectasis as well as reactive left axillary adenopathy.  Post surgery patient was given Decadron 10 mg last night.  Because of the changes on his repeat CT scan with a left chest wall abscess with gas on CT General surgery was consulted and they are planning on taking the patient back to the OR to address his right shoulder along with a large left lateral chest wall abscess and this was done 6/18.  ID recommends continuing IV cefazolin currently  Interval History:  [place] onto right knee yesterday when attempting to get OOB to the recliner due to loss of  balance.  No issues with his knee today, no swelling. Did have low grade fever last night for the first time in a handful of days.   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   Hyponatremia - Presumed due to dehydration and hypokalemia - severely low on admission, 113 - stabilizing out -BMP daily  Bacteremia due to Staphylococcus aureus-resolved as of 02/14/2021 - 4/4 blood cultures on admission growing MSSA -Repeated blood cultures on 02/01/2021; NGTD - TTE negative for vegetations; cardiology consulted for TEE, 02/05/21: POSITIVE for TV vegetation - now s/p IR drainage of left posterior chest wall fluid collection which was noted to be purulent in nature; culture also growing MSSA - MRI T spine 02/01/21 negative for OM/discitis  - MRI brain 6/12 negative for pathology in visual cortex to explain "floaters" in visual fields; seen by ophtho, negative for endophthalmitis - continue ancef - ID following  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 Since patient is post I & D of the chest wall abscess, it is uncertain how much of the increase in size of the abscess is due to postsurgical changes versus progression of abscess collection   Small BILATERAL pleural effusions and bibasilar atelectasis. Reactive LEFT axillary adenopathy." -General surgery evaluated and patient does have a large 17 x 16 x 6 cm fluid collection in the left lateral chest wall that does not have any communication to the actual thorax cavity.  General surgery feel this needs to be drained and he was taken to the OR by Dr. Redmond Pulling 02/10/21. -He is s/p "Incision and drainage of multiple left lateral chest wall abscesses along with excisional debridement of left lateral chest wall skin, soft tissue, muscle, and fascia and axilla." -Surgery did dressing change this AM on 02/12/21 he had his Dressing Changes done. Cx showing Abundant Staph Aureus but Sensitivities pending and likely MSSA. Surgery recommending no further debridement at this time and start BID Dressing changes (Wet  to Dry) -Cx Showing Abundant Staphylococcus Aures that is Resistant to Ciprofloxacin and Erythromycin -As Above Wound Vac now in place and [place] nurse consulted. BID Wet to Dry Dressing Changes and Orthopedic Surgery repeating Scans of shoulders and chest  - s/p left supraclavicular IR abscess drainage on 6/23  Right shoulder pain - s/p Right shoulder excisional debridement, including biceps tenolysis. Evacuation of purulent abscess, right subacromial space, as well as subcoracoid bursa. Right shoulder arthrotomy with irrigation and debridement of joint.  Right shoulder open distal clavicle resection" - ortho following for repeat washout or imaging  Left shoulder pain - See work-up above - Was previously seen in the ER after his fall and recommended for a sling with outpatient Ortho follow-up -Bilateral shoulder x-rays unremarkable but MRI left  shoulder shows large abscess in left axilla; partial width tear of anterior supraspinatus, high-grade partial-thickness articular surface tear of the posterior supraspinatus, high-grade partial-thickness articular surface tear of superior subscapularis, complete tear of biceps long head tendon within the bicipital groove - now s/p left shoulder excisional debridement, deep abscess, with arthrotomy and irrigation and debridement of the joint with biceps tenotomy and excision of necrotic rotator cuff with ortho surgery -Repeat left shoulder CT on 02/13/2021 shows persistent abscess along left chest wall/axilla - plan for repeat imaging next week with ortho and possibly more washout in OR  Vision changes - Patient now stating on 02/04/2021 that he has been seeing "black floaters" in his field of vision for several days.  When questioned why he did not bring this up sooner, he did not have a good answer and did not want to "trouble [place]" - Etiology unclear but concern would be for septic emboli given CT chest also concerning for septic emboli to the lungs - MRI brain ordered: punctate acute or subacute infarct vs artifact in high left frontal lobe. This would not affect his vision as he is noting.  -Evaluated by ophthalmology, 02/05/2021.  No endophthalmitis  DKA (diabetic ketoacidosis) (HCC)-resolved as of 02/02/2021 - present on admission - responded to fluids and insulin - now resolved  Thrombocytosis - Considered in setting of underlying infection - Follow CBC  Normocytic anemia - Continue monitoring CBC  Obesity -Complicates overall prognosis and care -Body mass index is 30.78 kg/m. -Weight Loss and Dietary Counseling given  Ascending aorta dilatation (HCC) - TTE on 6/10 notes mild dilation of ascending aorta, 38 mm - follow TEE results as well: normal thoracic and ascending aorta noted on 02/05/21  Anxiety - Wellbutrin and Zoloft on hold for now in setting of hyponatremia  HTN  (hypertension) - Pressure normal on admission.  Will treat if needed  Diabetes mellitus ([place]) - Continue SSI and CBG monitoring -Continue Lantus - A1c 10.2% on 01/31/2021  Right knee pain -Obtained X-Ray and showed "Medial joint space narrowing is noted with mild osteophytic change. Mild patellofemoral changes are noted as well. No joint effusion is seen. No acute fracture is noted."  Hyperkalemia-resolved as of 02/14/2021 - Noted earlier in the admission and [facility] - Follow BMP   Old records reviewed in assessment of this patient  Antimicrobials: Cefepime 01/31/21 x 1 Rocephin 01/31/21 x 1 Vanc 01/31/21 x 1 Ancef 02/01/21 >> current  DVT prophylaxis: enoxaparin (LOVENOX) injection 40 mg Start: 02/11/21 1000 Sequential compression device to OR Start: 02/10/21 1413 SCDs Start: 02/08/21 2116   Code Status:   Code Status: Full Code Family Communication:   Disposition Plan: Status is: Inpatient  Remains inpatient appropriate because:Ongoing  diagnostic testing needed not appropriate for outpatient work up, IV treatments appropriate due to intensity of illness or inability to take PO, and Inpatient level of care appropriate due to severity of illness  Dispo: The patient is from: Home              Anticipated d/c is to:  Possibly CIR              Patient currently is not medically stable to d/c.   Difficult to place patient No  Risk of unplanned readmission score: Unplanned Admission- Pilot do not use: 25.01   Objective: Blood pressure 103/74, pulse 82, temperature 98.9 F (37.2 C), temperature source Oral, resp. rate 15, height _0  (1.778 m), weight 97.3 kg, SpO2 99 %.  Examination: General appearance: alert, cooperative, fatigued, and no distress Head: Normocephalic, without obvious abnormality, atraumatic Eyes:  EOMI; gross field of vision is intact Back:  Improved swelling Lungs: clear to auscultation bilaterally Chest wall:  Bilateral bandages noted over  previously drained abscesses Back: B/L paraspinal drains in place, bulbs noted with clear/purulent drainage Heart: regular rate and rhythm, S1, S2 normal, and unable to appreciate any murmurs Abdomen: normal findings: bowel sounds normal and soft, non-tender Extremities: 1+ LE edema; much improved  Skin: mobility and turgor normal Neurologic: Grossly normal  Consultants:  ID Orthopedic surgery General surgery  Procedures:  IR drainage of back fluid collection, 6/10  Data Reviewed: I have personally reviewed following labs and imaging studies Results for orders placed or performed during the hospital encounter of 01/31/21 (from the past 24 hour(s))  Glucose, capillary     Status: None   Collection Time: 02/18/21  4:19 PM  Result Value Ref Range   Glucose-Capillary 71 70 - 99 mg/dL  Glucose, capillary     Status: Abnormal   Collection Time: 02/18/21 10:14 PM  Result Value Ref Range   Glucose-Capillary 109 (H) 70 - 99 mg/dL  CBC with Differential/Platelet     Status: Abnormal   Collection Time: 02/19/21  4:31 AM  Result Value Ref Range   WBC 8.7 4.0 - 10.5 K/uL   RBC 2.54 (L) 4.22 - 5.81 MIL/uL   Hemoglobin 7.7 (L) 13.0 - 17.0 g/dL   HCT 23.5 (L) 39.0 - 52.0 %   MCV 92.5 80.0 - 100.0 fL   MCH 30.3 26.0 - 34.0 pg   MCHC 32.8 30.0 - 36.0 g/dL   RDW 15.0 11.5 - 15.5 %   Platelets 630 (H) 150 - 400 K/uL   nRBC 0.0 0.0 - 0.2 %   Neutrophils Relative % 70 %   Neutro Abs 6.2 1.7 - 7.7 K/uL   Lymphocytes Relative 16 %   Lymphs Abs 1.4 0.7 - 4.0 K/uL   Monocytes Relative 10 %   Monocytes Absolute 0.9 0.1 - 1.0 K/uL   Eosinophils Relative 2 %   Eosinophils Absolute 0.2 0.0 - 0.5 K/uL   Basophils Relative 1 %   Basophils Absolute 0.0 0.0 - 0.1 K/uL   Immature Granulocytes 1 %   Abs Immature Granulocytes 0.05 0.00 - 0.07 K/uL  Basic metabolic panel     Status: Abnormal   Collection Time: 02/19/21  4:31 AM  Result Value Ref Range   Sodium 131 (L) 135 - 145 mmol/L   Potassium  4.3 3.5 - 5.1 mmol/L   Chloride 95 (L) 98 - 111 mmol/L   CO2 28 22 - 32 mmol/L   Glucose, Bld 100 (H) 70 - 99 mg/dL  BUN 6 6 - 20 mg/dL   Creatinine, Ser 0.48 (L) 0.61 - 1.24 mg/dL   Calcium 8.0 (L) 8.9 - 10.3 mg/dL   GFR, Estimated >60 >60 mL/min   Anion gap 8 5 - 15  Magnesium     Status: None   Collection Time: 02/19/21  4:31 AM  Result Value Ref Range   Magnesium 1.7 1.7 - 2.4 mg/dL  Glucose, capillary     Status: Abnormal   Collection Time: 02/19/21  8:12 AM  Result Value Ref Range   Glucose-Capillary 111 (H) 70 - 99 mg/dL  Glucose, capillary     Status: Abnormal   Collection Time: 02/19/21 11:53 AM  Result Value Ref Range   Glucose-Capillary 153 (H) 70 - 99 mg/dL    Recent Results (from the past 240 hour(s))  Aerobic/Anaerobic Culture w Gram Stain (surgical/deep wound)     Status: None   Collection Time: 02/10/21 10:22 AM   Specimen: Wound  Result Value Ref Range Status   Specimen Description   Final    WOUND CHEST Performed at [facility] [address], [place], [place] [address]    Special Requests   Final    NONE Performed at [facility], [place] [address] [place], [place] [address]    Gram Stain   Final    ABUNDANT WBC PRESENT,BOTH PMN AND MONONUCLEAR MODERATE GRAM POSITIVE COCCI    Culture   Final    ABUNDANT STAPHYLOCOCCUS AUREUS NO ANAEROBES ISOLATED Performed at [facility], [place] [address], [place], [place] [address]    Report Status 02/15/2021 FINAL  Final   Organism ID, Bacteria STAPHYLOCOCCUS AUREUS  Final      Susceptibility   Staphylococcus aureus - MIC*    CIPROFLOXACIN >=8 RESISTANT Resistant     ERYTHROMYCIN >=8 RESISTANT Resistant     GENTAMICIN <=0.5 SENSITIVE Sensitive     OXACILLIN 0.5 SENSITIVE Sensitive     TETRACYCLINE <=1 SENSITIVE Sensitive     VANCOMYCIN 1 SENSITIVE Sensitive     TRIMETH/SULFA <=10 SENSITIVE Sensitive     CLINDAMYCIN <=0.25 SENSITIVE Sensitive     RIFAMPIN <=0.5  SENSITIVE Sensitive     Inducible Clindamycin NEGATIVE Sensitive     * ABUNDANT STAPHYLOCOCCUS AUREUS  Culture, blood (single)     Status: None   Collection Time: 02/13/21  5:51 AM   Specimen: BLOOD  Result Value Ref Range Status   Specimen Description   Final    BLOOD RIGHT ANTECUBITAL Performed at [facility] [address], [place], [place] [address]    Special Requests   Final    BOTTLES DRAWN AEROBIC ONLY Blood Culture adequate volume Performed at [facility] [address], [place], [place] [address]    Culture   Final    NO GROWTH 5 DAYS Performed at [facility], [place] [address], [place], [place] [address]    Report Status 02/18/2021 FINAL  Final  Culture, blood (single)     Status: None   Collection Time: 02/14/21  2:12 AM   Specimen: BLOOD  Result Value Ref Range Status   Specimen Description   Final    BLOOD RIGHT ANTECUBITAL Performed at [facility] [address], [place], [place] [address]    Special Requests   Final    BOTTLES DRAWN AEROBIC AND ANAEROBIC Blood Culture adequate volume Performed at [facility] [address] [place],  [address]  Culture   Final    NO GROWTH 5 DAYS Performed at [facility], [place] [address], [place], [place] [address]    Report Status 02/19/2021 FINAL  Final  Aerobic/Anaerobic Culture w Gram Stain (surgical/deep wound)     Status: None (Preliminary result)   Collection Time: 02/15/21  5:02 PM   Specimen: Abscess  Result Value Ref Range Status   Specimen Description   Final    ABSCESS LEFT SHOULDER Performed at [facility] [address], [place], [place] [address]    Special Requests   Final    NONE Performed at [facility], [place] [address], [place], [place] [address]    Gram Stain   Final    NO WBC SEEN NO ORGANISMS SEEN Performed at [facility], [place] [address],  [place], [place] [address]    Culture   Final    FEW STAPHYLOCOCCUS AUREUS SUSCEPTIBILITIES PERFORMED ON PREVIOUS CULTURE WITHIN THE LAST 5 DAYS. NO ANAEROBES ISOLATED; CULTURE IN PROGRESS FOR 5 DAYS    Report Status PENDING  Incomplete  Aerobic/Anaerobic Culture w Gram Stain (surgical/deep wound)     Status: None (Preliminary result)   Collection Time: 02/15/21  5:06 PM   Specimen: Abscess  Result Value Ref Range Status   Specimen Description   Final    ABSCESS POST US GUIDED LEFT SUPRAVICULAR ABSCESS DRAIN [place] Performed at [facility], [place] [address] [place], [place] [address]    Special Requests   Final    NONE Performed at [facility], [place] [address] [place], [place] [address]    Gram Stain   Final    FEW WBC PRESENT,BOTH PMN AND MONONUCLEAR FEW GRAM POSITIVE COCCI IN PAIRS Performed at [facility], [place] [address], [place], [place] [address]    Culture   Final    ABUNDANT STAPHYLOCOCCUS AUREUS NO ANAEROBES ISOLATED; CULTURE IN PROGRESS FOR 5 DAYS    Report Status PENDING  Incomplete   Organism ID, Bacteria STAPHYLOCOCCUS AUREUS  Final      Susceptibility   Staphylococcus aureus - MIC*    CIPROFLOXACIN >=8 RESISTANT Resistant     ERYTHROMYCIN >=8 RESISTANT Resistant     GENTAMICIN <=0.5 SENSITIVE Sensitive     OXACILLIN 0.5 SENSITIVE Sensitive     TETRACYCLINE <=1 SENSITIVE Sensitive     VANCOMYCIN <=0.5 SENSITIVE Sensitive     TRIMETH/SULFA <=10 SENSITIVE Sensitive     CLINDAMYCIN <=0.25 SENSITIVE Sensitive     RIFAMPIN <=0.5 SENSITIVE Sensitive     Inducible Clindamycin NEGATIVE Sensitive     * ABUNDANT STAPHYLOCOCCUS AUREUS     Radiology Studies: CT CHEST WO CONTRAST  Result Date: 02/18/2021 CLINICAL DATA:  Left axillary abscess status post incision and drainage. Follow-up examination. EXAM: CT CHEST WITHOUT CONTRAST TECHNIQUE: Multidetector CT imaging of the chest was performed following the standard protocol  without IV contrast. COMPARISON:  02/13/2021, 02/09/2021 FINDINGS: Cardiovascular: No significant coronary artery calcification. Global cardiac size within normal limits. Hypoattenuation of the cardiac blood pool is again identified and is unchanged, in keeping with at least mild anemia. No pericardial effusion. Central pulmonary arteries are of normal caliber. Thoracic aorta is unremarkable. Mediastinum/Nodes: Bulky adenopathy within the left axilla is unchanged. No mediastinal or hilar adenopathy identified. No supraclavicular adenopathy identified though multiple small shotty left supraclavicular lymph nodes are identified, possibly reactive in nature. Visualized thyroid unremarkable. The esophagus is unremarkable. Lungs/Pleura: Mild bibasilar atelectasis.  Stable pulmonary  nodules: 7 mm, subpleural left upper lobe, axial image # 110/7 5 mm, left upper lobe, axial image # 130/7 9 mm, lingula, axial image # 139/7 Small left pleural effusion is unchanged. No pneumothorax. Central airways are widely patent. Upper Abdomen: No acute abnormality. Musculoskeletal: Penrose type drain again noted within the inferior left axilla and posterolateral left chest wall, similar prior examination. Near complete resolution of subcutaneous debris and fluid within the left posterolateral chest wall adjacent to the surgical incision noted at axial image # 70. Mottled gas within the high left axilla adjacent to the left axillary vein, axial image # 37, likely represents a a necrotic lymph node and appears stable since prior examination. There is mottled gas and fluid within the joint capsule of the left shoulder, as seen on prior examination, now with extension of the gas in fluid into the subcutaneous if tissues of the left shoulder anteriorly, best appreciated on axial image # 34/2. Fluid is again seen tracking from the a left glenohumeral joint capsule into the subscapular space and extending superiorly to surround the apical left  thoracic cage in extend into the supraclavicular region. A pigtail drainage catheter has been placed in this collection and there has been slight interval decrease in the amount of fluid noted superiorly, best appreciated on axial image # 20/2. Right shoulder effusion is again identified. Punctate foci of gas are again seen within the soft tissues anterior to the right shoulder within the pectoralis and deltoid musculature, similar to that noted on prior examination. IMPRESSION: Near complete resolution of fluid in debris within the inferior, posterolateral left chest wall adjacent to the surgical incision and Penrose type surgical drainage catheter. Stable bulky lymphadenopathy within the left axilla with at least 1 necrotic lymph node noted superiorly, similar to prior examination. Gas and fluid within the left shoulder joint capsule, similar to prior examination, now tracking anteriorly into the subcutaneous soft tissues anterior to the left shoulder as well as, similar prior examination, tracking posterior superiorly into the subscapular space and subsequently into the supraclavicular region. Interval placement of a pigtail type drainage catheter within the supraclavicular region with partial evacuation of fluid within this region. Stable right shoulder effusion and adjacent punctate gas and thickening of the pectoralis and deltoid musculature anterior to the coracoid process. Electronically Signed   By: Fidela Salisbury MD   On: 02/18/2021 23:49   CT CHEST WO CONTRAST  Final Result    CT IMAGE GUIDED DRAINAGE BY PERCUTANEOUS CATHETER  Final Result    CT IMAGE GUIDED DRAINAGE BY PERCUTANEOUS CATHETER  Final Result    [place] IMAGE GUIDED FLUID DRAIN BY CATHETER  Final Result    MR CERVICAL SPINE W WO CONTRAST  Final Result    MR Lumbar Spine W Wo Contrast  Final Result    MR THORACIC SPINE W WO CONTRAST  Final Result    DG CHEST PORT 1 VIEW  Final Result    CT SHOULDER LEFT WO CONTRAST  Final  Result    CT SHOULDER RIGHT WO CONTRAST  Final Result    CT CHEST WO CONTRAST  Final Result    DG Chest Port 1 View  Final Result    CT SHOULDER LEFT WO CONTRAST  Final Result    CT CHEST WO CONTRAST  Final Result    DG Knee 1-2 Views Right  Final Result    MR Shoulder Right W Wo Contrast  Final Result    MR Shoulder Left W Wo Contrast  Final Result    MR BRAIN WO CONTRAST  Final Result    IR US Guide Bx Asp/Drain  Final Result    CT CHEST ABDOMEN PELVIS W CONTRAST  Final Result    DG Shoulder Left  Final Result    DG Shoulder Right  Final Result    MR THORACIC SPINE W WO CONTRAST  Final Result    DG Chest Port 1 View  Final Result    CT SHOULDER RIGHT WO CONTRAST    (Results Pending)  CT SHOULDER LEFT WO CONTRAST    (Results Pending)    Scheduled Meds:  acetaminophen  500 mg Oral QID   enoxaparin (LOVENOX) injection  40 mg Subcutaneous Q24H   gabapentin  400 mg Oral TID   HYDROmorphone   Intravenous Q4H   insulin aspart  0-15 Units Subcutaneous TID WC   insulin aspart  0-5 Units Subcutaneous QHS   insulin aspart  4 Units Subcutaneous TID WC   insulin glargine  15 Units Subcutaneous Daily   magnesium oxide  400 mg Oral BID   mouth rinse  15 mL Mouth Rinse BID   polyethylene glycol  17 g Oral Daily   povidone-iodine  2 application Topical Once   senna-docusate  1 tablet Oral BID   sodium chloride flush  5 mL Intracatheter Q8H   PRN Meds: sodium chloride, alum & mag hydroxide-simeth, diphenhydrAMINE **OR** diphenhydrAMINE, docusate sodium, HYDROmorphone (DILAUDID) injection, lip balm, magic mouthwash w/lidocaine, methocarbamol, metoCLOPramide **OR** metoCLOPramide (REGLAN) injection, naloxone **AND** sodium chloride flush, ondansetron (ZOFRAN) IV, oxyCODONE, traZODone Continuous Infusions:  sodium chloride Stopped (02/07/21 0508)    ceFAZolin (ANCEF) IV 2 g (02/19/21 0838)     LOS: 19 days  Time spent: Greater than 50% of the 35 minute visit was  spent in counseling/coordination of care for the patient as laid out in the A&P.   Late entry: patient seen and rounded on 02/16/21  Dwyane Dee, MD [facility] 02/19/2021, 1:26 PM

## 2021-02-20 ENCOUNTER — Encounter (HOSPITAL_COMMUNITY): Payer: Self-pay | Admitting: Internal Medicine

## 2021-02-20 ENCOUNTER — Inpatient Hospital Stay (HOSPITAL_COMMUNITY): Payer: Commercial Managed Care - PPO | Admitting: Anesthesiology

## 2021-02-20 ENCOUNTER — Encounter (HOSPITAL_COMMUNITY)
Admission: EM | Disposition: A | Discharge status: Home Health | Payer: Self-pay | Source: Home / Self Care | Attending: Internal Medicine

## 2021-02-20 HISTORY — PX: MINOR APPLICATION OF WOUND VAC: SHX6243

## 2021-02-20 HISTORY — PX: IRRIGATION AND DEBRIDEMENT ABSCESS: SHX5252

## 2021-02-20 LAB — BASIC METABOLIC PANEL
Anion gap: 9 (ref 5–15)
BUN: 7 mg/dL (ref 6–20)
CO2: 30 mmol/L (ref 22–32)
Calcium: 8.3 mg/dL — ABNORMAL LOW (ref 8.9–10.3)
Chloride: 95 mmol/L — ABNORMAL LOW (ref 98–111)
Creatinine, Ser: 0.5 mg/dL — ABNORMAL LOW (ref 0.61–1.24)
GFR, Estimated: >60 mL/min (ref >60)
Glucose, Bld: 119 mg/dL — ABNORMAL HIGH (ref 70–99)
Potassium: 4.3 mmol/L (ref 3.5–5.1)
Sodium: 134 mmol/L — ABNORMAL LOW (ref 135–145)

## 2021-02-20 LAB — GLUCOSE, CAPILLARY
Glucose-Capillary: 119 mg/dL — ABNORMAL HIGH (ref 70–99)
Glucose-Capillary: 99 mg/dL (ref 70–99)
Glucose-Capillary: 112 mg/dL — ABNORMAL HIGH (ref 70–99)
Glucose-Capillary: 170 mg/dL — ABNORMAL HIGH (ref 70–99)

## 2021-02-20 SURGERY — MINOR INCISION AND DRAINAGE OF ABSCESS
Anesthesia: General | Site: Chest | Laterality: Left

## 2021-02-20 MED ORDER — LACTATED RINGERS IV SOLN: INTRAVENOUS | Status: DC

## 2021-02-20 MED ORDER — OXYCODONE HCL 5 MG PO TABS
5.0000 mg | ORAL_TABLET | Freq: Once | ORAL | Status: AC | PRN
  Administered 2021-02-20: 5 mg via ORAL

## 2021-02-20 MED ORDER — MIDAZOLAM HCL 5 MG/5ML IJ SOLN
INTRAMUSCULAR | Status: DC | PRN
  Administered 2021-02-20: 2 mg via INTRAVENOUS

## 2021-02-20 MED ORDER — LIDOCAINE 2% (20 MG/ML) 5 ML SYRINGE
INTRAMUSCULAR | Status: DC | PRN
  Administered 2021-02-20: 80 mg via INTRAVENOUS

## 2021-02-20 MED ORDER — VANCOMYCIN HCL 1000 MG IV SOLR
INTRAVENOUS | Status: DC | PRN
  Administered 2021-02-20: 1000 mg

## 2021-02-20 MED ORDER — PROPOFOL 10 MG/ML IV BOLUS
INTRAVENOUS | Status: AC
  Filled 2021-02-20: qty 20

## 2021-02-20 MED ORDER — ONDANSETRON HCL 4 MG/2ML IJ SOLN
INTRAMUSCULAR | Status: DC | PRN
  Administered 2021-02-20: 4 mg via INTRAVENOUS

## 2021-02-20 MED ORDER — VANCOMYCIN HCL 1000 MG IV SOLR
INTRAVENOUS | Status: AC
  Filled 2021-02-20: qty 2000

## 2021-02-20 MED ORDER — FENTANYL CITRATE (PF) 100 MCG/2ML IJ SOLN
INTRAMUSCULAR | Status: AC
  Administered 2021-02-20: 50 ug via INTRAVENOUS
  Filled 2021-02-20: qty 2

## 2021-02-20 MED ORDER — SODIUM CHLORIDE 0.9 % IR SOLN
Status: DC | PRN
  Administered 2021-02-20 (×2): 6000 mL

## 2021-02-20 MED ORDER — CHLORHEXIDINE GLUCONATE 4 % EX LIQD
60.0000 mL | Freq: Once | CUTANEOUS | Status: DC
  Filled 2021-02-20 (×2): qty 60

## 2021-02-20 MED ORDER — SUGAMMADEX SODIUM 200 MG/2ML IV SOLN
INTRAVENOUS | Status: DC | PRN
  Administered 2021-02-20: 200 mg via INTRAVENOUS

## 2021-02-20 MED ORDER — OXYCODONE HCL 5 MG PO TABS
ORAL_TABLET | ORAL | Status: AC
  Filled 2021-02-20: qty 1

## 2021-02-20 MED ORDER — POVIDONE-IODINE 10 % EX SWAB
2.0000 "application " | Freq: Once | CUTANEOUS | Status: AC
  Administered 2021-02-20: 2 via TOPICAL

## 2021-02-20 MED ORDER — OXYCODONE HCL 5 MG/5ML PO SOLN: 5.0000 mg | Freq: Once | ORAL | Status: AC | PRN

## 2021-02-20 MED ORDER — CHLORHEXIDINE GLUCONATE 0.12 % MT SOLN
15.0000 mL | OROMUCOSAL | Status: AC
  Administered 2021-02-20: 15 mL via OROMUCOSAL

## 2021-02-20 MED ORDER — PHENYLEPHRINE HCL-NACL 10-0.9 MG/250ML-% IV SOLN
INTRAVENOUS | Status: DC | PRN
  Administered 2021-02-20: 50 ug/min via INTRAVENOUS

## 2021-02-20 MED ORDER — ACETAMINOPHEN 500 MG PO TABS: 1000.0000 mg | ORAL_TABLET | Freq: Once | ORAL | Status: DC

## 2021-02-20 MED ORDER — ACETAMINOPHEN 160 MG/5ML PO SOLN: 1000.0000 mg | Freq: Once | ORAL | Status: DC | PRN

## 2021-02-20 MED ORDER — LIDOCAINE 2% (20 MG/ML) 5 ML SYRINGE
INTRAMUSCULAR | Status: AC
  Filled 2021-02-20: qty 5

## 2021-02-20 MED ORDER — ACETAMINOPHEN 10 MG/ML IV SOLN: 1000.0000 mg | Freq: Once | INTRAVENOUS | Status: DC | PRN

## 2021-02-20 MED ORDER — DEXAMETHASONE SODIUM PHOSPHATE 10 MG/ML IJ SOLN
INTRAMUSCULAR | Status: DC | PRN
  Administered 2021-02-20: 5 mg via INTRAVENOUS

## 2021-02-20 MED ORDER — ALBUTEROL SULFATE HFA 108 (90 BASE) MCG/ACT IN AERS
INHALATION_SPRAY | RESPIRATORY_TRACT | Status: AC
  Filled 2021-02-20: qty 6.7

## 2021-02-20 MED ORDER — ACETAMINOPHEN 500 MG PO TABS: 1000.0000 mg | ORAL_TABLET | Freq: Once | ORAL | Status: DC | PRN

## 2021-02-20 MED ORDER — FENTANYL CITRATE (PF) 100 MCG/2ML IJ SOLN
INTRAMUSCULAR | Status: AC
  Filled 2021-02-20: qty 2

## 2021-02-20 MED ORDER — VANCOMYCIN HCL 1000 MG IV SOLR
INTRAVENOUS | Status: AC
  Filled 2021-02-20: qty 1000

## 2021-02-20 MED ORDER — MIDAZOLAM HCL 2 MG/2ML IJ SOLN
INTRAMUSCULAR | Status: AC
  Filled 2021-02-20: qty 2

## 2021-02-20 MED ORDER — ROCURONIUM BROMIDE 10 MG/ML (PF) SYRINGE
PREFILLED_SYRINGE | INTRAVENOUS | Status: AC
  Filled 2021-02-20: qty 10

## 2021-02-20 MED ORDER — FENTANYL CITRATE (PF) 100 MCG/2ML IJ SOLN
INTRAMUSCULAR | Status: DC | PRN
  Administered 2021-02-20 (×4): 50 ug via INTRAVENOUS

## 2021-02-20 MED ORDER — PROPOFOL 10 MG/ML IV BOLUS
INTRAVENOUS | Status: DC | PRN
  Administered 2021-02-20: 120 mg via INTRAVENOUS

## 2021-02-20 MED ORDER — FENTANYL CITRATE (PF) 100 MCG/2ML IJ SOLN
25.0000 ug | INTRAMUSCULAR | Status: DC | PRN
  Administered 2021-02-20: 50 ug via INTRAVENOUS

## 2021-02-20 MED ORDER — ALBUMIN HUMAN 5 % IV SOLN: INTRAVENOUS | Status: DC | PRN

## 2021-02-20 MED ORDER — ONDANSETRON HCL 4 MG/2ML IJ SOLN
INTRAMUSCULAR | Status: AC
  Filled 2021-02-20: qty 2

## 2021-02-20 MED ORDER — ROCURONIUM BROMIDE 10 MG/ML (PF) SYRINGE
PREFILLED_SYRINGE | INTRAVENOUS | Status: DC | PRN
  Administered 2021-02-20: 60 mg via INTRAVENOUS
  Administered 2021-02-20: 20 mg via INTRAVENOUS

## 2021-02-20 SURGICAL SUPPLY — 59 items
BNDG COHESIVE 4X5 TAN STRL (GAUZE/BANDAGES/DRESSINGS) ×2 IMPLANT
BNDG GAUZE ELAST 4 BULKY (GAUZE/BANDAGES/DRESSINGS) ×2 IMPLANT
BOOTCOVER CLEANROOM LRG (PROTECTIVE WEAR) ×6 IMPLANT
CANISTER WOUNDNEG PRESSURE 500 (CANNISTER) ×6 IMPLANT
CNTNR URN SCR LID CUP LEK RST (MISCELLANEOUS) IMPLANT
CONT SPEC 4OZ STRL OR WHT (MISCELLANEOUS) ×3
COVER SURGICAL LIGHT HANDLE (MISCELLANEOUS) ×3 IMPLANT
COVER WAND RF STERILE (DRAPES) ×3 IMPLANT
DRAPE U-SHAPE 47X51 STRL (DRAPES) ×6 IMPLANT
DRESSING MEPILEX FLEX 4X4 (GAUZE/BANDAGES/DRESSINGS) IMPLANT
DRSG MEPILEX BORDER 4X8 (GAUZE/BANDAGES/DRESSINGS) IMPLANT
DRSG MEPILEX FLEX 4X4 (GAUZE/BANDAGES/DRESSINGS) ×3
DRSG MEPILEX SACRM 8.7X9.8 (GAUZE/BANDAGES/DRESSINGS) ×1 IMPLANT
DRSG PAD ABDOMINAL 8X10 ST (GAUZE/BANDAGES/DRESSINGS) ×6 IMPLANT
DRSG VAC ATS MED SENSATRAC (GAUZE/BANDAGES/DRESSINGS) ×6 IMPLANT
DURAPREP 26ML APPLICATOR (WOUND CARE) ×6 IMPLANT
ELECT PENCIL ROCKER SW 15FT (MISCELLANEOUS) ×1 IMPLANT
ELECT REM PT RETURN 15FT ADLT (MISCELLANEOUS) ×2 IMPLANT
EVACUATOR 1/8 PVC DRAIN (DRAIN) IMPLANT
GAUZE SPONGE 4X4 12PLY STRL (GAUZE/BANDAGES/DRESSINGS) ×6 IMPLANT
GAUZE XEROFORM 1X8 LF (GAUZE/BANDAGES/DRESSINGS) ×3 IMPLANT
GLOVE SRG 8 PF TXTR STRL LF DI (GLOVE) ×2 IMPLANT
GLOVE SURG ENC MOIS LTX SZ7 (GLOVE) ×3 IMPLANT
GLOVE SURG ORTHO LTX SZ7.5 (GLOVE) ×3 IMPLANT
GLOVE SURG UNDER POLY LF SZ7 (GLOVE) ×4 IMPLANT
GLOVE SURG UNDER POLY LF SZ8 (GLOVE) ×6
GOWN STRL REUS W/ TWL XL LVL3 (GOWN DISPOSABLE) ×2 IMPLANT
GOWN STRL REUS W/TWL 2XL LVL3 (GOWN DISPOSABLE) ×4 IMPLANT
GOWN STRL REUS W/TWL XL LVL3 (GOWN DISPOSABLE) ×6
HANDPIECE INTERPULSE COAX TIP (DISPOSABLE) ×6
KIT BASIN OR (CUSTOM PROCEDURE TRAY) ×3 IMPLANT
KIT TURNOVER KIT B (KITS) ×3 IMPLANT
MANIFOLD NEPTUNE II (INSTRUMENTS) ×5 IMPLANT
NS IRRIG 1000ML POUR BTL (IV SOLUTION) ×3 IMPLANT
PACK SHOULDER (CUSTOM PROCEDURE TRAY) ×3 IMPLANT
PAD ARMBOARD 7.5X6 YLW CONV (MISCELLANEOUS) ×6 IMPLANT
SET HNDPC FAN SPRY TIP SCT (DISPOSABLE) IMPLANT
SPONGE LAP 18X18 RF (DISPOSABLE) ×3 IMPLANT
SPONGE T-LAP 18X18 ~~LOC~~+RFID (SPONGE) ×2 IMPLANT
STERISHIELD HEADGEAR (ORTHOPEDIC SUPPLIES) ×8 IMPLANT
STOCKINETTE 4X48 STRL (DRAPES) ×4 IMPLANT
SUPPORT WRAP ARM LG (MISCELLANEOUS) IMPLANT
SUT ETHILON 2 0 PSLX (SUTURE) ×2 IMPLANT
SUT ETHILON 3 0 PS 1 (SUTURE) IMPLANT
SUT VIC AB 0 CT1 27 (SUTURE)
SUT VIC AB 0 CT1 27XBRD ANBCTR (SUTURE) IMPLANT
SUT VIC AB 2-0 CT1 27 (SUTURE)
SUT VIC AB 2-0 CT1 TAPERPNT 27 (SUTURE) IMPLANT
SWAB COLLECTION DEVICE MRSA (MISCELLANEOUS) ×2 IMPLANT
SWAB CULTURE ESWAB REG 1ML (MISCELLANEOUS) IMPLANT
TAPE CLOTH SURG 6X10 WHT LF (GAUZE/BANDAGES/DRESSINGS) ×2 IMPLANT
TOWEL OR 17X26 10 PK STRL BLUE (TOWEL DISPOSABLE) ×7 IMPLANT
TOWEL OR NON WOVEN STRL DISP B (DISPOSABLE) ×3 IMPLANT
TRAY PREP A LATEX SAFE STRL (SET/KITS/TRAYS/PACK) ×1 IMPLANT
TUBE CONNECTING 12X1/4 (SUCTIONS) ×3 IMPLANT
TUBING CONNECTING 10 (TUBING) ×2 IMPLANT
UNDERPAD 30X36 HEAVY ABSORB (UNDERPADS AND DIAPERS) ×3 IMPLANT
WATER STERILE IRR 1000ML POUR (IV SOLUTION) ×2 IMPLANT
YANKAUER SUCT BULB TIP NO VENT (SUCTIONS) ×3 IMPLANT

## 2021-02-20 NOTE — Progress Notes (Addendum)
Progress Note    John Ware   DGU:[id number]  DOB: 11/29/1965  DOA: 01/31/2021     20  PCP: Sharion Balloon, FNP  CC: Weakness, ongoing pain  Hospital Course: John Ware is a 55 y.o. male with medical history significant of chronic pain, HTN, DM2.  He had at home positive COVID test on 01/12/2021.  He had no respiratory symptoms at this time but he did have fevers that was responsive to Tylenol.  Reportedly he apparently fell out of his chair and hurt his left shoulder and he was evaluated by the ED and they found no fracture and he was given a sling and told to follow-up with orthopedics.  Since that time the patient's wife reports that he has had general body aches and pain and has increased symptoms that are not responsive to his chronic pain meds.  He is also had poor appetite during this time and has been unable to walk for last few days.  Because of these constantly some symptoms he is brought to the ED for further evaluation.  In the ED is found to have a sodium of 113 and a glucose of 339 and a WBC of 20.5 as well as a lactic acid of 3.8.  He started on broad-spectrum antibiotics and given fluids.  Soon after his blood cultures became positive for 4-4 MSSA.  He was transitioned to IV Ancef.  With further work-up and confirmation he was also found to have a large fluid collection of left lateral posterior chest and back.  He did have tenderness in his back.  He underwent MRI T-spine and CT of the chest abdomen pelvis.  There is no evidence of any osteomyelitis or discitis or other underlying infection on MRI T-spine.  CT of the chest did show a large multiloculated fluid collection.  IR was consulted and he underwent an ultrasound-guided aspiration was sent for culturing.  CT was concerning for evidence of septic emboli but 2D echo did not reveal any evidence of an endocarditis.  Cardiology was consulted for TEE per ID recommendations and he underwent TEE on 02/05/2021 which found a  large tricuspid valve vegetation with mild tricuspid regurg.  Subsequently he still complained of some shoulder pain so he underwent bilateral MRIs of his shoulders and he is still having fevers.  At the left shoulder was concerned for septic arthritis and left axillary abscess.  Orthopedics was reconsulted as well as IR and the orthopedic surgeon is planning to do a washout tomorrow in edition IR is going to place a drain on the residual fluid collection that may exist after washout.  Because of the surgery ID changed the patient to nafcillin however recommending continuing cefazolin at this time. ESR and CRP and both are elevated.   Patient underwent on 02/08/21 left shoulder excisional debridement, deep abscess with arthrotomy and irrigation debridement of the joint with biceps tenotomy and excision of the necrotic rotator cuff.  Patient is not medically ready to be D/C'd to CIR.   Postoperatively he had a CT scan and repeated and showed a marked increase in size of the abscess collection at the left lateral chest wall with persistent abscess again seen in the subscapular extending cranial to the apex of the left hemithorax.  Since he is post I&D it was unclear about how much of the increase in size of the abscess was due to the postsurgical changes progression of the abscess collection.  Patient did have some small bilateral pleural  effusions and bibasilar atelectasis as well as reactive left axillary adenopathy.  Post surgery patient was given Decadron 10 mg last night.  Because of the changes on his repeat CT scan with a left chest wall abscess with gas on CT General surgery was consulted and they are planning on taking the patient back to the OR to address his right shoulder along with a large left lateral chest wall abscess and this was done 6/18.  ID recommends continuing IV cefazolin currently  Interval History:  Patient was seen and examined today.  He was complaining of muscle spasms at multiple  drain sites.  Continue to use PCA for pain control.  Waiting for OR later today for septic shoulder joint washout and wound VAC change with orthopedic. Continue to have low-grade fever.  Assessment & Plan: Old records reviewed in assessment of this patient. Complicated course of admission with multiple abscesses requiring multiple procedures. Please see prior notes for more detail.  Severe sepsis/MSSA bacteremia/tricuspid endocarditis. Initially met severe sepsis criteria with fever, tachycardia, tachypnea, leukocytosis and lactic acidosis. Initial blood cultures with MSSA, repeat blood cultures done on 02/01/2021 remain negative. TEE done on 6/13 with large tricuspid vegetation.  Large left posterior wall and bilateral psoas muscle abscesses, bilateral septic shoulder joints, osteomyelitis and discitis.[facility] Kitchen MRI brain done on 6/12 due to his complaint of seeing floaters was negative for endophthalmitis or any other acute abnormality. Going for repeat washout of his septic shoulder joints and wound VAC change with orthopedic today. -Continue with Ancef -Continue pain management.  DKA.  Resolved.  Type 2 diabetes mellitus.  A1c of 10.2 on 01/31/2021 -Continue with Lantus 15 units daily. -Continue with SSI along with mealtime coverage of 4 units.  Hyponatremia.  Continue to improve, sodium at 134. -Continue to monitor  Ascending aorta dilatation (HCC) - TTE on 6/10 notes mild dilation of ascending aorta, 38 mm - follow TEE results as well: normal thoracic and ascending aorta noted on 02/05/21   Anxiety - Wellbutrin and Zoloft on hold for now in setting of hyponatremia   Antimicrobials: Cefepime 01/31/21 x 1 Rocephin 01/31/21 x 1 Vanc 01/31/21 x 1 Ancef 02/01/21 >> current  DVT prophylaxis: Sequential compression device to OR Start: 02/20/21 0210 enoxaparin (LOVENOX) injection 40 mg Start: 02/11/21 1000 Sequential compression device to OR Start: 02/10/21 1413 SCDs Start: 02/08/21  2116   Code Status:   Code Status: Full Code Family Communication:   Disposition Plan: Status is: Inpatient  Remains inpatient appropriate because:Ongoing diagnostic testing needed not appropriate for outpatient work up, IV treatments appropriate due to intensity of illness or inability to take PO, and Inpatient level of care appropriate due to severity of illness  Dispo: The patient is from: Home              Anticipated d/c is to:  Possibly CIR              Patient currently is not medically stable to d/c.   Difficult to place patient No  Risk of unplanned readmission score: Unplanned Admission- Pilot do not use: 25.81   Objective: Blood pressure 113/67, pulse 85, temperature 98.8 F (37.1 C), temperature source Oral, resp. rate 20, height _0  (1.778 m), weight 97.3 kg, SpO2 95 %.  Examination: General.  Well-developed gentleman, in no acute distress.  Multiple drains in place. Pulmonary.  Lungs clear bilaterally, normal respiratory effort. CV.  Regular rate and rhythm,  Abdomen.  Soft, nontender, nondistended, BS positive. CNS.  Alert and  oriented x3.  No focal neurologic deficit. Extremities.  No edema, no cyanosis, pulses intact and symmetrical. Psychiatry.  Judgment and insight appears normal.    Consultants:  ID Orthopedic surgery General surgery  Procedures:  IR drainage of back fluid collection, 6/10  Data Reviewed: I have personally reviewed following labs and imaging studies Results for orders placed or performed during the hospital encounter of 01/31/21 (from the past 24 hour(s))  Glucose, capillary     Status: Abnormal   Collection Time: 02/19/21  4:43 PM  Result Value Ref Range   Glucose-Capillary 116 (H) 70 - 99 mg/dL  Glucose, capillary     Status: Abnormal   Collection Time: 02/19/21  8:50 PM  Result Value Ref Range   Glucose-Capillary 123 (H) 70 - 99 mg/dL  Basic metabolic panel     Status: Abnormal   Collection Time: 02/20/21  8:15 AM  Result  Value Ref Range   Sodium 134 (L) 135 - 145 mmol/L   Potassium 4.3 3.5 - 5.1 mmol/L   Chloride 95 (L) 98 - 111 mmol/L   CO2 30 22 - 32 mmol/L   Glucose, Bld 119 (H) 70 - 99 mg/dL   BUN 7 6 - 20 mg/dL   Creatinine, Ser 0.50 (L) 0.61 - 1.24 mg/dL   Calcium 8.3 (L) 8.9 - 10.3 mg/dL   GFR, Estimated >60 >60 mL/min   Anion gap 9 5 - 15  Glucose, capillary     Status: Abnormal   Collection Time: 02/20/21  8:18 AM  Result Value Ref Range   Glucose-Capillary 119 (H) 70 - 99 mg/dL  Glucose, capillary     Status: None   Collection Time: 02/20/21  2:15 PM  Result Value Ref Range   Glucose-Capillary 99 70 - 99 mg/dL    Recent Results (from the past 240 hour(s))  Culture, blood (single)     Status: None   Collection Time: 02/13/21  5:51 AM   Specimen: BLOOD  Result Value Ref Range Status   Specimen Description   Final    BLOOD RIGHT ANTECUBITAL Performed at [facility], [address] [place], [place] [address]    Special Requests   Final    BOTTLES DRAWN AEROBIC ONLY Blood Culture adequate volume Performed at [facility] [address] [place], [place] [address]    Culture   Final    NO GROWTH 5 DAYS Performed at [facility], [place] [address], [place], [place] [address]    Report Status 02/18/2021 FINAL  Final  Culture, blood (single)     Status: None   Collection Time: 02/14/21  2:12 AM   Specimen: BLOOD  Result Value Ref Range Status   Specimen Description   Final    BLOOD RIGHT ANTECUBITAL Performed at [facility] [address], [place], [place] [address]    Special Requests   Final    BOTTLES DRAWN AEROBIC AND ANAEROBIC Blood Culture adequate volume Performed at [facility] [address] [place], [place] [address]    Culture   Final    NO GROWTH 5 DAYS Performed at [facility], [place] [address], [place], [place] [address]    Report Status 02/19/2021 FINAL  Final   Aerobic/Anaerobic Culture w Gram Stain (surgical/deep wound)     Status: None (Preliminary result)   Collection Time: 02/15/21  5:02 PM   Specimen: Abscess  Result Value Ref Range Status   Specimen Description  Final    ABSCESS LEFT SHOULDER Performed at [facility] [address], [place], [place] [address]    Special Requests   Final    NONE Performed at [facility], [place] [address] [place], [place] [address]    Gram Stain   Final    NO WBC SEEN NO ORGANISMS SEEN Performed at [facility], [place] [address] [place], [place] [address]    Culture   Final    FEW STAPHYLOCOCCUS AUREUS SUSCEPTIBILITIES PERFORMED ON PREVIOUS CULTURE WITHIN THE LAST 5 DAYS. NO ANAEROBES ISOLATED; CULTURE IN PROGRESS FOR 5 DAYS    Report Status PENDING  Incomplete  Aerobic/Anaerobic Culture w Gram Stain (surgical/deep wound)     Status: None (Preliminary result)   Collection Time: 02/15/21  5:06 PM   Specimen: Abscess  Result Value Ref Range Status   Specimen Description   Final    ABSCESS POST US GUIDED LEFT SUPRAVICULAR ABSCESS DRAIN [place] Performed at [facility], [place] [address] [place], [place] [address]    Special Requests   Final    NONE Performed at [facility], [place] [address], [place], [place] [address]    Gram Stain   Final    FEW WBC PRESENT,BOTH PMN AND MONONUCLEAR FEW GRAM POSITIVE COCCI IN PAIRS Performed at [facility], [place] [address], [place], [place] [address]    Culture   Final    ABUNDANT STAPHYLOCOCCUS AUREUS NO ANAEROBES ISOLATED; CULTURE IN PROGRESS FOR 5 DAYS    Report Status PENDING  Incomplete   Organism ID, Bacteria STAPHYLOCOCCUS AUREUS  Final      Susceptibility   Staphylococcus aureus - MIC*    CIPROFLOXACIN >=8 RESISTANT Resistant     ERYTHROMYCIN >=8 RESISTANT Resistant     GENTAMICIN <=0.5 SENSITIVE Sensitive     OXACILLIN 0.5 SENSITIVE Sensitive      TETRACYCLINE <=1 SENSITIVE Sensitive     VANCOMYCIN <=0.5 SENSITIVE Sensitive     TRIMETH/SULFA <=10 SENSITIVE Sensitive     CLINDAMYCIN <=0.25 SENSITIVE Sensitive     RIFAMPIN <=0.5 SENSITIVE Sensitive     Inducible Clindamycin NEGATIVE Sensitive     * ABUNDANT STAPHYLOCOCCUS AUREUS     Radiology Studies: CT CHEST WO CONTRAST  Result Date: 02/18/2021 CLINICAL DATA:  Left axillary abscess status post incision and drainage. Follow-up examination. EXAM: CT CHEST WITHOUT CONTRAST TECHNIQUE: Multidetector CT imaging of the chest was performed following the standard protocol without IV contrast. COMPARISON:  02/13/2021, 02/09/2021 FINDINGS: Cardiovascular: No significant coronary artery calcification. Global cardiac size within normal limits. Hypoattenuation of the cardiac blood pool is again identified and is unchanged, in keeping with at least mild anemia. No pericardial effusion. Central pulmonary arteries are of normal caliber. Thoracic aorta is unremarkable. Mediastinum/Nodes: Bulky adenopathy within the left axilla is unchanged. No mediastinal or hilar adenopathy identified. No supraclavicular adenopathy identified though multiple small shotty left supraclavicular lymph nodes are identified, possibly reactive in nature. Visualized thyroid unremarkable. The esophagus is unremarkable. Lungs/Pleura: Mild bibasilar atelectasis.  Stable pulmonary nodules: 7 mm, subpleural left upper lobe, axial image # 110/7 5 mm, left upper lobe, axial image # 130/7 9 mm, lingula, axial image # 139/7 Small left pleural effusion is unchanged. No pneumothorax. Central airways are widely patent. Upper Abdomen: No acute abnormality. Musculoskeletal: Penrose type drain again noted within the inferior left axilla and posterolateral left chest wall, similar prior examination. Near complete resolution of subcutaneous debris and fluid within the  left posterolateral chest wall adjacent to the surgical incision noted at axial image #  70. Mottled gas within the high left axilla adjacent to the left axillary vein, axial image # 37, likely represents a a necrotic lymph node and appears stable since prior examination. There is mottled gas and fluid within the joint capsule of the left shoulder, as seen on prior examination, now with extension of the gas in fluid into the subcutaneous if tissues of the left shoulder anteriorly, best appreciated on axial image # 34/2. Fluid is again seen tracking from the a left glenohumeral joint capsule into the subscapular space and extending superiorly to surround the apical left thoracic cage in extend into the supraclavicular region. A pigtail drainage catheter has been placed in this collection and there has been slight interval decrease in the amount of fluid noted superiorly, best appreciated on axial image # 20/2. Right shoulder effusion is again identified. Punctate foci of gas are again seen within the soft tissues anterior to the right shoulder within the pectoralis and deltoid musculature, similar to that noted on prior examination. IMPRESSION: Near complete resolution of fluid in debris within the inferior, posterolateral left chest wall adjacent to the surgical incision and Penrose type surgical drainage catheter. Stable bulky lymphadenopathy within the left axilla with at least 1 necrotic lymph node noted superiorly, similar to prior examination. Gas and fluid within the left shoulder joint capsule, similar to prior examination, now tracking anteriorly into the subcutaneous soft tissues anterior to the left shoulder as well as, similar prior examination, tracking posterior superiorly into the subscapular space and subsequently into the supraclavicular region. Interval placement of a pigtail type drainage catheter within the supraclavicular region with partial evacuation of fluid within this region. Stable right shoulder effusion and adjacent punctate gas and thickening of the pectoralis and deltoid  musculature anterior to the coracoid process. Electronically Signed   By: Fidela Salisbury MD   On: 02/18/2021 23:49   CT SHOULDER LEFT WO CONTRAST  Result Date: 02/19/2021 CLINICAL DATA:  Septic arthritis suspected, shoulder, xray done EXAM: CT OF THE UPPER LEFT EXTREMITY WITHOUT CONTRAST TECHNIQUE: Multidetector CT imaging of the upper left extremity was performed according to the standard protocol. COMPARISON:  Left shoulder CT 02/13/2021, left shoulder MRI 02/07/2021 FINDINGS: Bones/Joint/Cartilage No evidence of acute fracture. No new osseous erosion, destruction, or periosteal reaction. Persistent gas containing distension of the subacromial-subdeltoid bursa and small glenohumeral joint effusion. Ligaments Suboptimally assessed by CT. Muscles and Tendons There is a new drain placed into a supraclavicular/lower left neck collection, which is significantly decreased in size. There is visible soft tissue gas and fluid noted posteriorly in the neck (axial images 30, 35, 39). Persistent gas/fluid collecting along the intercostal tissues, left axilla, and upper back deep soft tissues (for examples, see axial images 46, 73, and 57-58). - Left axillary collection measures 3.5 x 2.0 cm in the axial plane and 6.7 cm in craniocaudal extent (axial image 69, coronal image 62). -Additional left axillary collection measures 4.9 x 2.7 cm in the axial plane and 4.5 cm in craniocaudal extent (axial image 86, coronal image 49). There is increased gas and fluid collecting in the subcutaneous tissues anteriorly along the superficial surface of the anterior deltoid/pectoralis muscle (axial images 47-60), which may connect with the subacromial-subdeltoid bursa collection on axial soft tissue image 57. Soft tissues Persistent subcutaneous soft tissue swelling. There is a surgical drain along the left posterolateral soft tissues at the level of the inferior angle of  the scapula, without well-defined collection in this location.  There is a small left pleural effusion and left lower lung atelectasis. IMPRESSION: New drain placed into a supraclavicular collection, which is significantly decreased in size. Persistent extensive soft tissue abscess along the left chest wall/axilla, and visible gas/fluid collecting posteriorly along the lower neck and upper back deep soft tissues, reference images and measurements above. Persistent gas containing distension of the subacromial-subdeltoid bursa and small glenohumeral joint effusion. New fluid and gas collecting superficially in the subcutaneous tissues along the surface of the anterior deltoid/distal pectoralis muscle, which may connect with the subacromial-subdeltoid bursa collection. No new acute osseous abnormality to suggest osteomyelitis. Electronically Signed   By: Maurine Simmering   On: 02/19/2021 09:08   CT SHOULDER RIGHT WO CONTRAST  Result Date: 02/19/2021 CLINICAL DATA:  Septic arthritis suspected, shoulder, xray done EXAM: CT OF THE UPPER RIGHT EXTREMITY WITHOUT CONTRAST TECHNIQUE: Multidetector CT imaging of the upper right extremity was performed according to the standard protocol. COMPARISON:  CT 02/13/2021 FINDINGS: Bones/Joint/Cartilage Postsurgical changes along the right shoulder, including AC joint resection. There is persistent soft tissue fluid and gas anteriorly along the clavicular head of the pectoralis major, slightly increased from prior exam. There is no well-defined rim enhancing collection at this time. There is persistent small glenohumeral joint effusion and mild distension of the subacromial-subdeltoid bursa and subcoracoid bursa. No new osseous changes to suggest osteomyelitis. Ligaments Suboptimally assessed by CT. Muscles and Tendons Findings of full-thickness rotator cuff tear. Soft tissues Mild soft tissue swelling along the shoulder. No lymphadenopathy. Linear atelectasis in the right lower lobe. IMPRESSION: Postsurgical changes of right AC joint  debridement/resection. Persistent ill-defined soft tissue gas and fluid anteriorly along the clavicular head of the pectoralis major, which is slightly increased from prior exam, which could be developing infection versus postsurgical change. Persistent small glenohumeral joint effusion, and mild distension of the subacromial-subdeltoid bursa and subcoracoid bursa. No new osseous changes to suggest osteomyelitis. Electronically Signed   By: Maurine Simmering   On: 02/19/2021 08:39   CT SHOULDER RIGHT WO CONTRAST  Final Result    CT SHOULDER LEFT WO CONTRAST  Final Result    CT CHEST WO CONTRAST  Final Result    CT IMAGE GUIDED DRAINAGE BY PERCUTANEOUS CATHETER  Final Result    CT IMAGE GUIDED DRAINAGE BY PERCUTANEOUS CATHETER  Final Result    [place] IMAGE GUIDED FLUID DRAIN BY CATHETER  Final Result    MR CERVICAL SPINE W WO CONTRAST  Final Result    MR Lumbar Spine W Wo Contrast  Final Result    MR THORACIC SPINE W WO CONTRAST  Final Result    DG CHEST PORT 1 VIEW  Final Result    CT SHOULDER LEFT WO CONTRAST  Final Result    CT SHOULDER RIGHT WO CONTRAST  Final Result    CT CHEST WO CONTRAST  Final Result    DG Chest Port 1 View  Final Result    CT SHOULDER LEFT WO CONTRAST  Final Result    CT CHEST WO CONTRAST  Final Result    DG Knee 1-2 Views Right  Final Result    MR Shoulder Right W Wo Contrast  Final Result    MR Shoulder Left W Wo Contrast  Final Result    MR BRAIN WO CONTRAST  Final Result    IR US Guide Bx Asp/Drain  Final Result    CT CHEST ABDOMEN PELVIS W CONTRAST  Final Result  DG Shoulder Left  Final Result    DG Shoulder Right  Final Result    MR THORACIC SPINE W WO CONTRAST  Final Result    DG Chest Port 1 View  Final Result      Scheduled Meds:  acetaminophen  1,000 mg Oral Once   [MAR Hold] acetaminophen  500 mg Oral QID   chlorhexidine  60 mL Topical Once   [MAR Hold] enoxaparin (LOVENOX) injection  40 mg Subcutaneous  Q24H   [MAR Hold] gabapentin  400 mg Oral TID   [MAR Hold] HYDROmorphone   Intravenous Q4H   [MAR Hold] insulin aspart  0-15 Units Subcutaneous TID WC   [MAR Hold] insulin aspart  0-5 Units Subcutaneous QHS   [MAR Hold] insulin aspart  4 Units Subcutaneous TID WC   [MAR Hold] insulin glargine  15 Units Subcutaneous Daily   [MAR Hold] mouth rinse  15 mL Mouth Rinse BID   [MAR Hold] polyethylene glycol  17 g Oral Daily   povidone-iodine  2 application Topical Once   [MAR Hold] senna-docusate  1 tablet Oral BID   [MAR Hold] sodium chloride flush  5 mL Intracatheter Q8H   PRN Meds: [MAR Hold] sodium chloride, [MAR Hold] alum & mag hydroxide-simeth, [MAR Hold] diphenhydrAMINE **OR** [MAR Hold] diphenhydrAMINE, [MAR Hold] docusate sodium, [MAR Hold]  HYDROmorphone (DILAUDID) injection, [MAR Hold] lip balm, [MAR Hold] magic mouthwash w/lidocaine, [MAR Hold] methocarbamol, [MAR Hold] metoCLOPramide **OR** [MAR Hold] metoCLOPramide (REGLAN) injection, [MAR Hold] naloxone **AND** [MAR Hold] sodium chloride flush, [MAR Hold] ondansetron (ZOFRAN) IV, [MAR Hold] oxyCODONE, [MAR Hold] traZODone Continuous Infusions:  [MAR Hold] sodium chloride Stopped (02/07/21 0508)   [MAR Hold]  ceFAZolin (ANCEF) IV 2 g (02/20/21 0900)   lactated ringers 10 mL/hr at 02/20/21 1431     LOS: 20 days  Time spent: Greater than 50% of the 35 minute visit was spent in counseling/coordination of care for the patient as laid out in the A&P.   Lorella Nimrod, MD [facility] 02/20/2021, 2:45 PM

## 2021-02-20 NOTE — Progress Notes (Addendum)
10 Days Post-Op  Subjective: CC: Happy he will be going to the OR for vac change today  Objective: Vital signs in last 24 hours: Temp:  [98.9 F (37.2 C)-100 F (37.8 C)] 100 F (37.8 C) (06/27 2104) Pulse Rate:  [82-107] 107 (06/27 2104) Resp:  [10-20] 16 (06/28 0800) BP: (103-131)/(74-90) 131/90 (06/27 2104) SpO2:  [87 %-99 %] 96 % (06/28 0800) Last BM Date: 02/19/21  Intake/Output from previous day: 06/27 0701 - 06/28 0700 In: 555 [P.O.:240; IV Piggyback:300] Out: 2125 [Urine:1925; Drains:200] Intake/Output this shift: Total I/O In: -  Out: 50 [Drains:50]  PE: Left chest wall: VAC in place with good seal.  Purulent drainage in canister.  No periwound cellulitis  Lab Results:  Recent Labs    02/18/21 0508 02/19/21 0431  WBC 9.7 8.7  HGB 7.4* 7.7*  HCT 22.9* 23.5*  PLT 606* 630*   BMET Recent Labs    02/19/21 0431 02/20/21 0815  NA 131* 134*  K 4.3 4.3  CL 95* 95*  CO2 28 30  GLUCOSE 100* 119*  BUN 6 7  CREATININE 0.48* 0.50*  CALCIUM 8.0* 8.3*   PT/INR No results for input(s): LABPROT, INR in the last 72 hours. CMP     Component Value Date/Time   NA 134 (L) 02/20/2021 0815   NA 137 10/09/2020 1155   K 4.3 02/20/2021 0815   CL 95 (L) 02/20/2021 0815   CO2 30 02/20/2021 0815   GLUCOSE 119 (H) 02/20/2021 0815   BUN 7 02/20/2021 0815   BUN 11 10/09/2020 1155   CREATININE 0.50 (L) 02/20/2021 0815   CALCIUM 8.3 (L) 02/20/2021 0815   PROT 5.2 (L) 02/14/2021 0212   PROT 7.6 10/09/2020 1155   ALBUMIN 1.8 (L) 02/14/2021 0212   ALBUMIN 4.7 10/09/2020 1155   AST 53 (H) 02/14/2021 0212   ALT 30 02/14/2021 0212   ALKPHOS 53 02/14/2021 0212   BILITOT 0.7 02/14/2021 0212   BILITOT 0.8 10/09/2020 1155   GFRNONAA >60 02/20/2021 0815   GFRAA 113 10/09/2020 1155   Lipase  No results found for: LIPASE     Studies/Results: CT CHEST WO CONTRAST  Result Date: 02/18/2021 CLINICAL DATA:  Left axillary abscess status post incision and drainage.  Follow-up examination. EXAM: CT CHEST WITHOUT CONTRAST TECHNIQUE: Multidetector CT imaging of the chest was performed following the standard protocol without IV contrast. COMPARISON:  02/13/2021, 02/09/2021 FINDINGS: Cardiovascular: No significant coronary artery calcification. Global cardiac size within normal limits. Hypoattenuation of the cardiac blood pool is again identified and is unchanged, in keeping with at least mild anemia. No pericardial effusion. Central pulmonary arteries are of normal caliber. Thoracic aorta is unremarkable. Mediastinum/Nodes: Bulky adenopathy within the left axilla is unchanged. No mediastinal or hilar adenopathy identified. No supraclavicular adenopathy identified though multiple small shotty left supraclavicular lymph nodes are identified, possibly reactive in nature. Visualized thyroid unremarkable. The esophagus is unremarkable. Lungs/Pleura: Mild bibasilar atelectasis.  Stable pulmonary nodules: 7 mm, subpleural left upper lobe, axial image # 110/7 5 mm, left upper lobe, axial image # 130/7 9 mm, lingula, axial image # 139/7 Small left pleural effusion is unchanged. No pneumothorax. Central airways are widely patent. Upper Abdomen: No acute abnormality. Musculoskeletal: Penrose type drain again noted within the inferior left axilla and posterolateral left chest wall, similar prior examination. Near complete resolution of subcutaneous debris and fluid within the left posterolateral chest wall adjacent to the surgical incision noted at axial image # 70. Mottled gas within the  high left axilla adjacent to the left axillary vein, axial image # 37, likely represents a a necrotic lymph node and appears stable since prior examination. There is mottled gas and fluid within the joint capsule of the left shoulder, as seen on prior examination, now with extension of the gas in fluid into the subcutaneous if tissues of the left shoulder anteriorly, best appreciated on axial image # 34/2.  Fluid is again seen tracking from the a left glenohumeral joint capsule into the subscapular space and extending superiorly to surround the apical left thoracic cage in extend into the supraclavicular region. A pigtail drainage catheter has been placed in this collection and there has been slight interval decrease in the amount of fluid noted superiorly, best appreciated on axial image # 20/2. Right shoulder effusion is again identified. Punctate foci of gas are again seen within the soft tissues anterior to the right shoulder within the pectoralis and deltoid musculature, similar to that noted on prior examination. IMPRESSION: Near complete resolution of fluid in debris within the inferior, posterolateral left chest wall adjacent to the surgical incision and Penrose type surgical drainage catheter. Stable bulky lymphadenopathy within the left axilla with at least 1 necrotic lymph node noted superiorly, similar to prior examination. Gas and fluid within the left shoulder joint capsule, similar to prior examination, now tracking anteriorly into the subcutaneous soft tissues anterior to the left shoulder as well as, similar prior examination, tracking posterior superiorly into the subscapular space and subsequently into the supraclavicular region. Interval placement of a pigtail type drainage catheter within the supraclavicular region with partial evacuation of fluid within this region. Stable right shoulder effusion and adjacent punctate gas and thickening of the pectoralis and deltoid musculature anterior to the coracoid process. Electronically Signed   By: Helyn NumbersAshesh  Parikh MD   On: 02/18/2021 23:49   CT SHOULDER LEFT WO CONTRAST  Result Date: 02/19/2021 CLINICAL DATA:  Septic arthritis suspected, shoulder, xray done EXAM: CT OF THE UPPER LEFT EXTREMITY WITHOUT CONTRAST TECHNIQUE: Multidetector CT imaging of the upper left extremity was performed according to the standard protocol. COMPARISON:  Left shoulder CT  02/13/2021, left shoulder MRI 02/07/2021 FINDINGS: Bones/Joint/Cartilage No evidence of acute fracture. No new osseous erosion, destruction, or periosteal reaction. Persistent gas containing distension of the subacromial-subdeltoid bursa and small glenohumeral joint effusion. Ligaments Suboptimally assessed by CT. Muscles and Tendons There is a new drain placed into a supraclavicular/lower left neck collection, which is significantly decreased in size. There is visible soft tissue gas and fluid noted posteriorly in the neck (axial images 30, 35, 39). Persistent gas/fluid collecting along the intercostal tissues, left axilla, and upper back deep soft tissues (for examples, see axial images 46, 73, and 57-58). - Left axillary collection measures 3.5 x 2.0 cm in the axial plane and 6.7 cm in craniocaudal extent (axial image 69, coronal image 62). -Additional left axillary collection measures 4.9 x 2.7 cm in the axial plane and 4.5 cm in craniocaudal extent (axial image 86, coronal image 49). There is increased gas and fluid collecting in the subcutaneous tissues anteriorly along the superficial surface of the anterior deltoid/pectoralis muscle (axial images 47-60), which may connect with the subacromial-subdeltoid bursa collection on axial soft tissue image 57. Soft tissues Persistent subcutaneous soft tissue swelling. There is a surgical drain along the left posterolateral soft tissues at the level of the inferior angle of the scapula, without well-defined collection in this location. There is a small left pleural effusion and left lower lung  atelectasis. IMPRESSION: New drain placed into a supraclavicular collection, which is significantly decreased in size. Persistent extensive soft tissue abscess along the left chest wall/axilla, and visible gas/fluid collecting posteriorly along the lower neck and upper back deep soft tissues, reference images and measurements above. Persistent gas containing distension of the  subacromial-subdeltoid bursa and small glenohumeral joint effusion. New fluid and gas collecting superficially in the subcutaneous tissues along the surface of the anterior deltoid/distal pectoralis muscle, which may connect with the subacromial-subdeltoid bursa collection. No new acute osseous abnormality to suggest osteomyelitis. Electronically Signed   By: Caprice Renshaw   On: 02/19/2021 09:08   CT SHOULDER RIGHT WO CONTRAST  Result Date: 02/19/2021 CLINICAL DATA:  Septic arthritis suspected, shoulder, xray done EXAM: CT OF THE UPPER RIGHT EXTREMITY WITHOUT CONTRAST TECHNIQUE: Multidetector CT imaging of the upper right extremity was performed according to the standard protocol. COMPARISON:  CT 02/13/2021 FINDINGS: Bones/Joint/Cartilage Postsurgical changes along the right shoulder, including AC joint resection. There is persistent soft tissue fluid and gas anteriorly along the clavicular head of the pectoralis major, slightly increased from prior exam. There is no well-defined rim enhancing collection at this time. There is persistent small glenohumeral joint effusion and mild distension of the subacromial-subdeltoid bursa and subcoracoid bursa. No new osseous changes to suggest osteomyelitis. Ligaments Suboptimally assessed by CT. Muscles and Tendons Findings of full-thickness rotator cuff tear. Soft tissues Mild soft tissue swelling along the shoulder. No lymphadenopathy. Linear atelectasis in the right lower lobe. IMPRESSION: Postsurgical changes of right AC joint debridement/resection. Persistent ill-defined soft tissue gas and fluid anteriorly along the clavicular head of the pectoralis major, which is slightly increased from prior exam, which could be developing infection versus postsurgical change. Persistent small glenohumeral joint effusion, and mild distension of the subacromial-subdeltoid bursa and subcoracoid bursa. No new osseous changes to suggest osteomyelitis. Electronically Signed   By: Caprice Renshaw   On: 02/19/2021 08:39    Anti-infectives: Anti-infectives (From admission, onward)    Start     Dose/Rate Route Frequency Ordered Stop   02/10/21 0727  ceFAZolin (ANCEF) 2-4 GM/100ML-% IVPB       Note to Pharmacy: Lyda Kalata   : cabinet override      02/10/21 0727 02/10/21 1856   02/01/21 0600  ceFAZolin (ANCEF) IVPB 2g/100 mL premix        2 g 200 mL/hr over 30 Minutes Intravenous Every 8 hours 02/01/21 0414     01/31/21 1800  cefTRIAXone (ROCEPHIN) 1 g in sodium chloride 0.9 % 100 mL IVPB  Status:  Discontinued        1 g 200 mL/hr over 30 Minutes Intravenous Every 24 hours 01/31/21 1453 02/01/21 0425   01/31/21 1115  vancomycin (VANCOREADY) IVPB 2000 mg/400 mL        2,000 mg 200 mL/hr over 120 Minutes Intravenous  Once 01/31/21 1105 01/31/21 1320   01/31/21 1045  ceFEPIme (MAXIPIME) 2 g in sodium chloride 0.9 % 100 mL IVPB        2 g 200 mL/hr over 30 Minutes Intravenous  Once 01/31/21 1035 01/31/21 1129   01/31/21 1045  metroNIDAZOLE (FLAGYL) IVPB 500 mg        500 mg 100 mL/hr over 60 Minutes Intravenous  Once 01/31/21 1035 01/31/21 1208   01/31/21 1045  vancomycin (VANCOCIN) IVPB 1000 mg/200 mL premix  Status:  Discontinued        1,000 mg 200 mL/hr over 60 Minutes Intravenous  Once 01/31/21 1035 01/31/21  1105        Assessment/Plan Complex left lateral chest wall and axillary abscess POD 10 S/p Incision and drainage of multiple left lateral chest wall abscesses along with excisional debridement of left lateral chest wall skin, soft tissue, muscle, and fascia and axilla 6/18 Dr. Andrey Campanile - Cx with abundant staphylococcus aureus - ID following and managing antibiotics - PCA for pain - CT chest 6/21 with decrease in the volume of gas and debris in the chest wall. CT shoulder on L show a persistent, extensive abscess along the left chest wall/axilla, with visible gas/fluid extending superiorly in the neck to the level of C6/C7, and posteriorly to the paraspinal  musculature. Axilla fluid collection measures 4.0 x 1.9 x 7.5cm. S/p IR drain placement 6/23 - Will plan to change vac in OR today when orthopedics takes back for bilateral shoulder washout Addendum: After discussion with Dr. Dion Saucier and reviewing most recent CT's, he requested that Dr. Daphine Deutscher eval intra-op to determine if further debridement is necessary. Discussed with Dr. Daphine Deutscher who agrees to evaluate intra-op to determine if further debridement is needed.    Bilateral shoulder abscess, s/p debridement 6/16 and 6/18 - Per Dr. 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 Septic arthitis right T3-4 and T4-5 facets on MRI Bilateral psoas abscess, R>L (3.7cm) - s/p drain placement x 2 on 6/23 by IR HTN DM Chronic back pain   LOS: 20 days    [name] , [facility] 02/20/2021, 10:50 AM Please see Amion for pager number during day hours 7:00am-4:30pm

## 2021-02-20 NOTE — Op Note (Signed)
.  John Ware  Oct 27, 1965   02/20/2021    PCP:  Junie Spencer, FNP   Surgeon: Wenda Low, MD, FACS  Asst:  none  Anes:  general  Preop Dx: Multiple abscesses of the chest and back Postop Dx: same  Procedure: Exploration of the left axilla and other open sores Location Surgery: [facility] Complications: none  EBL:   Per Dr. Dion Saucier  cc  Drains: multiple  Description of Procedure:  The patient was taken to OR 7 by  Dr. Dion Saucier.  He had undergone debridement by Dr. Dion Saucier.  I performed an EUA.  I examined the left side particularly the left axilla and the back.  One of the tracks extended beneath the L dorsi on the left.  A node or necrotic fat that was part of a mass in the axilla was sent to path.  Wounds were left to be packed with saline soaked gauze.       Matt B. Daphine Deutscher, MD, [facility] Surgery, [place] [phone]

## 2021-02-20 NOTE — Op Note (Signed)
02/20/2021  9:27 PM  PATIENT:  John Ware    PRE-OPERATIVE DIAGNOSIS:    1.  Right shoulder infected deep abscess, extending down into the joint with pre-existing rotator cuff arthropathy 2.  Left shoulder deep abscess, subdeltoid space and involving glenohumeral joint and subacromial space 3.  Left shoulder deep abscess, supraclavicular fossa, involving the supraclavicular space extending to the base of the spine, cervical 4.  Left shoulder abscess, contained within the subscapular bursa, between the chest wall and the subscapularis 5.  Left shoulder abscess, axilla, between the chest wall and the axilla including the neurovascular structures  POST-OPERATIVE DIAGNOSIS:  Same  PROCEDURE: 1.  Excisional debridement, subdeltoid bursa, extending down into the subacromial space and the glenohumeral joint 2.  Excisional debridement, supraclavicular fossa, extending into the deep portion of the cervical musculature 3.  Excisional debridement, left subscapular space, with a posterior medial approach to the medial border of the scapula to the region between the scapula and thoracic cage 4.  Excisional debridement, axillary abscess 5.  Application of large wound VAC, 30 cm x 5 cm x 5 cm  SURGEON:  Eulas Post, MD  PHYSICIAN ASSISTANT: Janine Ores, PA-C, present and scrubbed throughout the case, critical for completion in a timely fashion, and for retraction, instrumentation, and closure.  ANESTHESIA:   General  PREOPERATIVE INDICATIONS:  John Ware is a  55 y.o. male with a diagnosis of bilateral infected shoulder who elected for surgical management.  He already underwent surgical intervention at a previous sitting, on both shoulders, but CT scans demonstrated persistent gas accumulation, and he elected for repeat surgical intervention.  The risks benefits and alternatives were discussed with the patient preoperatively including but not limited to the risks of infection,  bleeding, nerve injury, cardiopulmonary complications, the need for revision surgery, among others, and the patient was willing to proceed.  We also discussed the risks for persistent long-term chronic rotator cuff dysfunction, progression of infection, inability to clear the infection, need for future surgical intervention, among others.  ESTIMATED BLOOD LOSS: 75 mL on the right shoulder, 150 mL on the left shoulder.  OPERATIVE IMPLANTS: Medium wound VAC on the right shoulder, with a medium wound VAC extended to be a long large wound VAC on the left shoulder, along with wet-to-dry packing for the left chest wall on the lower portion of the torso  OPERATIVE FINDINGS: The right shoulder had abnormal purulent air-filled material in the anterior aspect of the shoulder deep to the conjoined tendon, consistent with the preoperative CAT scan.  The left shoulder had substantial purulence in the subdeltoid space, subacromial space, did not look that bad down in the joint, but the worst of the necrotic material was in the subdeltoid space, and it also did not appear very healthy around the lower incision from the chest wall.  The supraclavicular fossa was not overly impressive, I did open this region with a perforation bluntly dissecting through the trapezius, tracking down where the previous drain was placed, I did not find dramatic purulence.  I explored the body cavity anterior to the scapula posterior to the chest wall, through a incision along the medial border of the scapula, I did not encounter dramatic purulence in that location either.  With the assistance of Dr. [name], I also explored the left axilla, in the region where I felt the CAT scan indicated that there was some retained air and purulent pocket, although again I, I did not find dramatic purulence.  The majority of the purulence was still in the subdeltoid and shoulder girdle space.    OPERATIVE PROCEDURE: The patient was brought  to the operating room and placed in supine position.  General anesthesia was administered.  First I began work on the right shoulder.  He was placed into the lateral decubitus position with all bony prominences padded.  Right upper extremity was prepped and draped in usual sterile fashion.  The previous sutures were removed.  Timeout performed.  The deltopectoral approach was opened, and explored, and I found significant purulence in the region just deep to the conjoined tendon.  All of this purulent material was removed.  The rest of the shoulder looked to be in reasonable condition.  I irrigated a total of 6 L of fluid, and then placed a wound VAC with excellent seal.  The patient was then turned to the supine and then the contralateral lateral decubitus position, all bony prominences were padded, the left upper extremity was then prepped and draped in usual sterile fashion, I prepped a wide field in order to gain access to the medial scapula.  There was substantial purulence from the deltopectoral incision, as well as from the distal incision from the chest wall, timeout was performed, I remove the previous sutures, the deltopectoral space was exposed, and had substantial purulence that was removed.  I extended the deltopectoral incision proximally, and then up over the supraclavicular space, into the area where the previous Penrose drain was placed by the interventional radiologists, and dissected bluntly down through the skin, subcutaneous tissue, as well as the trapezial fascia, and then explored the region, I was able to gain access to the base of the cervical spine and the musculature posteriorly, and evacuated all of the fluid with a Yankauer suction.  I then made an incision over the medial border of the scapula, dissected down to the medial bony prominence, and then elevated some of the soft tissue and approximately a 1-1/2 inch plain, and 1 explored the subscapular space between the dorsal chest  wall and the subscapular musculature.  I evacuated any fluid that I could find in that location.  I then irrigated all of the wounds with a total of 6 L of fluid.  I felt that I had adequately evacuated the supraclavicular fossa as well as the ventral border of the scapula itself, along with exploring the deltopectoral space which looked to be the worst, and I also explored down into the axilla from the superior incision.  Dr. [name] then joined me, and made a small incision in the axillary region, we dissected along the chest wall and found a way to where I believe that the fluid was still retained in the axilla that had gas present.  I did not really find dramatic necrotic material in that location but we did send some to the laboratory, and I evacuated what ever fluid and tissue I could without posing significant risk to the brachial plexus and neurovascular structures.  All of the wounds were irrigated copiously, and a wound VAC was applied to the upper extremity wounds, the wounds along the chest wall border were packed with wet-to-dry gauze according to the instructions from Dr. Daphine Deutscher, and the patient was awakened and returned to the PACU in stable and satisfactory condition.  I did close the dorsal scapular incision with nylon because I did not feel that there was substantial communication to a dramatic amount of fluid that would require drainage on an ongoing basis,  or at least would benefit from drainage of on an ongoing basis.  The plan will be for management of the wound VAC to the upper extremity, wet-to-dry dressings according to general surgery for the lower portion of the torso wound.  He will likely undergo revision I&D and closure of the areas of wound VAC placement on both upper extremities next week with Dr. Everardo Pacific while I am away.  Debridement type: Excisional Debridement  Side: right  Body Location: shoulders   Tools used for debridement: scalpel, curette, and  rongeur  Pre-debridement Wound size (cm):   Length: 15        Width: 0     Depth: 0   Post-debridement Wound size (cm):   Length: 15        Width: 5     Depth: 5   Debridement depth beyond dead/damaged tissue down to healthy viable tissue: yes  Tissue layer involved: skin, subcutaneous tissue, muscle / fascia, bone  Nature of tissue removed: Necrotic, Devitalized Tissue, Non-viable tissue, and Purulence  Irrigation volume: 6L     Irrigation fluid type: Normal Saline  Debridement type: Excisional Debridement  Side: left  Body Location: shoulder, supraclavicular fossa, subscapular bursa, axilla   Tools used for debridement: scalpel, scissors, curette, and rongeur  Pre-debridement Wound size (cm):   Length: 15        Width: 0     Depth: 0   Post-debridement Wound size (cm):   Length: 35        Width: 5     Depth: 5   Debridement depth beyond dead/damaged tissue down to healthy viable tissue: yes  Tissue layer involved: skin, subcutaneous tissue, muscle / fascia, bone  Nature of tissue removed: Slough, Necrotic, Devitalized Tissue, Non-viable tissue, and Purulence  Irrigation volume: 6L     Irrigation fluid type: Normal Saline      Eulas Post, MD

## 2021-02-20 NOTE — Transfer of Care (Signed)
Immediate Anesthesia Transfer of Care Note  Patient: John Ware  Procedure(s) Performed: MINOR INCISION AND DRAINAGE OF ABSCESS/WITH BILATERAL WOUND VAC PLACEMENT (Bilateral) MINOR APPLICATION OF KERLIX PACKING/REMOVAL OF WOUND VAC (Left: Chest)  Patient Location: PACU  Anesthesia Type:General  Level of Consciousness: awake, alert  and oriented  Airway & Oxygen Therapy: Patient Spontanous Breathing and Patient connected to face mask  Post-op Assessment: Report given to RN and Post -op Vital signs reviewed and stable  Post vital signs: Reviewed and stable  Last Vitals:  Vitals Value Taken Time  BP 138/94 02/20/21 1831  Temp 36.8 C 02/20/21 1830  Pulse 86 02/20/21 1839  Resp 12 02/20/21 1839  SpO2 100 % 02/20/21 1839  Vitals shown include unvalidated device data.  Last Pain:  Vitals:   02/20/21 1419  TempSrc: Oral  PainSc:       Patients Stated Pain Goal: 3 (02/20/21 0745)  Complications: No notable events documented.

## 2021-02-20 NOTE — Progress Notes (Signed)
Patient reexamined, CT scans reviewed, discussed with general surgery, plan for repeat I&D of the right shoulder, possible wound VAC, with left shoulder I&D, exploration, will need to decompress the abscess that is up in his supraclavicular fossa, the medial scapula, the ventral scapula, the axillary region, as well as the anterior deltoid.  Risks benefits and alternatives of been discussed with the patient including the risk for progressive infection, significant neurovascular injury, further destruction, the further need for further surgical intervention, morbidity and mortality, among others and he is willing to proceed.  Eulas Post, MD

## 2021-02-20 NOTE — Anesthesia Preprocedure Evaluation (Addendum)
Anesthesia Evaluation  Patient identified by MRN, date of birth, ID band Patient awake    Reviewed: Allergy & Precautions, H&P , NPO status , Patient's Chart, lab work & pertinent test results  Airway Mallampati: II  TM Distance: >3 FB Neck ROM: Full    Dental  (+) Poor Dentition, Chipped, Missing, Loose, Dental Advisory Given,  Front upper teeth permanent bridge:   Pulmonary former smoker,  Quit smoking 2012, 30 pack year history    Pulmonary exam normal breath sounds clear to auscultation       Cardiovascular hypertension, Pt. on medications Normal cardiovascular exam Rhythm:Regular Rate:Normal  ECHO 6/22 1. Left ventricular ejection fraction, by estimation, is 55 to 60%. The left ventricle has normal function. The left ventricle has no regional wall motion abnormalities. There is mild left ventricular hypertrophy. Left ventricular diastolic parameters were normal. 2. Right ventricular systolic function is normal. The right ventricular size is normal. 3. The mitral valve is normal in structure. No evidence of mitral valve regurgitation. No evidence of mitral stenosis. 4. The aortic valve is tricuspid. There is mild calcification of the aortic valve. Aortic valve regurgitation is not visualized. Mild aortic valve sclerosis is present, with no evidence of aortic valve stenosis. 5. Aortic dilatation noted. There is mild dilatation of the ascending aorta, measuring 38 mm. 6. The inferior vena cava is normal in size with greater than 50% respiratory variability, suggesting right atrial pressure of 3 mmHg.   Neuro/Psych PSYCHIATRIC DISORDERS Anxiety Depression negative neurological ROS     GI/Hepatic negative GI ROS, Neg liver ROS,   Endo/Other  diabetes, Well Controlled, Type 2, Oral Hypoglycemic AgentsFS 99  Renal/GU   negative genitourinary   Musculoskeletal  (+) Arthritis , Osteoarthritis,  Spinal stenosis     Abdominal   Peds negative pediatric ROS (+)  Hematology  (+) Blood dyscrasia, anemia , H/H 7.7/23.5   Anesthesia Other Findings Bilateral infected shoulder  Reproductive/Obstetrics negative OB ROS                            Anesthesia Physical Anesthesia Plan  ASA: 3  Anesthesia Plan: General   Post-op Pain Management:    Induction: Intravenous  PONV Risk Score and Plan: 2 and Dexamethasone, Ondansetron, Midazolam and Treatment may vary due to age or medical condition  Airway Management Planned: Oral ETT  Additional Equipment: None  Intra-op Plan:   Post-operative Plan: Extubation in OR  Informed Consent: I have reviewed the patients History and Physical, chart, labs and discussed the procedure including the risks, benefits and alternatives for the proposed anesthesia with the patient or authorized representative who has indicated his/her understanding and acceptance.     Dental advisory given  Plan Discussed with: CRNA  Anesthesia Plan Comments:        Anesthesia Quick Evaluation

## 2021-02-20 NOTE — Anesthesia Postprocedure Evaluation (Signed)
Anesthesia Post Note  Patient: John Ware  Procedure(s) Performed: MINOR INCISION AND DRAINAGE OF ABSCESS/WITH BILATERAL WOUND VAC PLACEMENT (Bilateral) MINOR APPLICATION OF KERLIX PACKING/REMOVAL OF WOUND VAC (Left: Chest)     Patient location during evaluation: PACU Anesthesia Type: General Level of consciousness: awake and alert Pain management: pain level controlled Vital Signs Assessment: post-procedure vital signs reviewed and stable Respiratory status: spontaneous breathing, nonlabored ventilation, respiratory function stable and patient connected to nasal cannula oxygen Cardiovascular status: blood pressure returned to baseline and stable Postop Assessment: no apparent nausea or vomiting Anesthetic complications: no   No notable events documented.  Last Vitals:  Vitals:   02/20/21 1915 02/20/21 2017  BP: 114/69 115/71  Pulse: 91 88  Resp: 13 14  Temp: 36.8 C 37.7 C  SpO2: 96% 95%    Last Pain:  Vitals:   02/20/21 2017  TempSrc: Oral  PainSc:                  Mira Balon S

## 2021-02-20 NOTE — Anesthesia Procedure Notes (Signed)
Procedure Name: Intubation Date/Time: 02/20/2021 3:48 PM Performed by: Brendt Dible D, CRNA Pre-anesthesia Checklist: Patient identified, Emergency Drugs available, Suction available and Patient being monitored Patient Re-evaluated:Patient Re-evaluated prior to induction Oxygen Delivery Method: Circle system utilized Preoxygenation: Pre-oxygenation with 100% oxygen Induction Type: IV induction Ventilation: Mask ventilation without difficulty Laryngoscope Size: Mac and 4 Grade View: Grade I Tube type: Oral Number of attempts: 1 Airway Equipment and Method: Stylet Placement Confirmation: ETT inserted through vocal cords under direct vision, positive ETCO2 and breath sounds checked- equal and bilateral Secured at: 22 cm Tube secured with: Tape Dental Injury: Teeth and Oropharynx as per pre-operative assessment

## 2021-02-20 NOTE — Consult Note (Addendum)
WOC Nurse wound follow up: Pt refused Vac change Mon; refer to previous progress notes. Dr Daphine Deutscher of the surgical team states surgery will preform Vac dressing change in the OR today when pt is having an orthopedic procedure performed at that time. WOC team will plan to change the dressing again on FRI, then resume M/W/F VAC change next week. Discussed via secure chat with surgical PA. Thank-you,  Cammie Mcgee MSN, RN, CWOCN, [place], CNS [phone]

## 2021-02-21 ENCOUNTER — Encounter (HOSPITAL_COMMUNITY): Payer: Self-pay | Admitting: Orthopedic Surgery

## 2021-02-21 LAB — CBC WITH DIFFERENTIAL/PLATELET
Abs Immature Granulocytes: 0.04 10*3/uL (ref 0.00–0.07)
Basophils Absolute: 0 10*3/uL (ref 0.0–0.1)
Basophils Relative: 0 %
Eosinophils Absolute: 0 10*3/uL (ref 0.0–0.5)
Eosinophils Relative: 0 %
HCT: 24.2 % — ABNORMAL LOW (ref 39.0–52.0)
Hemoglobin: 7.6 g/dL — ABNORMAL LOW (ref 13.0–17.0)
Immature Granulocytes: 1 %
Lymphocytes Relative: 8 %
Lymphs Abs: 0.7 10*3/uL (ref 0.7–4.0)
MCH: 29.7 pg (ref 26.0–34.0)
MCHC: 31.4 g/dL (ref 30.0–36.0)
MCV: 94.5 fL (ref 80.0–100.0)
Monocytes Absolute: 0.3 10*3/uL (ref 0.1–1.0)
Monocytes Relative: 3 %
Neutro Abs: 7.8 10*3/uL — ABNORMAL HIGH (ref 1.7–7.7)
Neutrophils Relative %: 88 %
Platelets: 630 10*3/uL — ABNORMAL HIGH (ref 150–400)
RBC: 2.56 MIL/uL — ABNORMAL LOW (ref 4.22–5.81)
RDW: 14.6 % (ref 11.5–15.5)
WBC: 8.8 10*3/uL (ref 4.0–10.5)
nRBC: 0 % (ref 0.0–0.2)

## 2021-02-21 LAB — IRON AND TIBC
Iron: 23 ug/dL — ABNORMAL LOW (ref 45–182)
Saturation Ratios: 14 % — ABNORMAL LOW (ref 17.9–39.5)
TIBC: 168 ug/dL — ABNORMAL LOW (ref 250–450)
UIBC: 145 ug/dL

## 2021-02-21 LAB — AEROBIC/ANAEROBIC CULTURE W GRAM STAIN (SURGICAL/DEEP WOUND): Gram Stain: NONE SEEN

## 2021-02-21 LAB — BASIC METABOLIC PANEL
Anion gap: 9 (ref 5–15)
BUN: 10 mg/dL (ref 6–20)
CO2: 27 mmol/L (ref 22–32)
Calcium: 8 mg/dL — ABNORMAL LOW (ref 8.9–10.3)
Chloride: 95 mmol/L — ABNORMAL LOW (ref 98–111)
Creatinine, Ser: 0.56 mg/dL — ABNORMAL LOW (ref 0.61–1.24)
GFR, Estimated: >60 mL/min (ref >60)
Glucose, Bld: 371 mg/dL — ABNORMAL HIGH (ref 70–99)
Potassium: 4.7 mmol/L (ref 3.5–5.1)
Sodium: 131 mmol/L — ABNORMAL LOW (ref 135–145)

## 2021-02-21 LAB — GLUCOSE, CAPILLARY
Glucose-Capillary: 323 mg/dL — ABNORMAL HIGH (ref 70–99)
Glucose-Capillary: 307 mg/dL — ABNORMAL HIGH (ref 70–99)
Glucose-Capillary: 188 mg/dL — ABNORMAL HIGH (ref 70–99)
Glucose-Capillary: 100 mg/dL — ABNORMAL HIGH (ref 70–99)

## 2021-02-21 LAB — MAGNESIUM: Magnesium: 1.8 mg/dL (ref 1.7–2.4)

## 2021-02-21 LAB — FOLATE: Folate: 8.8 ng/mL (ref >5.9)

## 2021-02-21 LAB — FERRITIN: Ferritin: 569 ng/mL — ABNORMAL HIGH (ref 24–336)

## 2021-02-21 LAB — RETICULOCYTES
Immature Retic Fract: 20.2 % — ABNORMAL HIGH (ref 2.3–15.9)
RBC.: 2.78 MIL/uL — ABNORMAL LOW (ref 4.22–5.81)
Retic Count, Absolute: 58.7 10*3/uL (ref 19.0–186.0)
Retic Ct Pct: 2.1 % (ref 0.4–3.1)

## 2021-02-21 LAB — VITAMIN B12: Vitamin B-12: 1975 pg/mL — ABNORMAL HIGH (ref 180–914)

## 2021-02-21 MED ORDER — INSULIN GLARGINE 100 UNIT/ML ~~LOC~~ SOLN
20.0000 [IU] | Freq: Every day | SUBCUTANEOUS | Status: DC
  Administered 2021-02-22 – 2021-02-23 (×2): 20 [IU] via SUBCUTANEOUS
  Filled 2021-02-21 (×2): qty 0.2

## 2021-02-21 MED ORDER — FERROUS SULFATE 325 (65 FE) MG PO TABS
325.0000 mg | ORAL_TABLET | Freq: Two times a day (BID) | ORAL | Status: DC
  Administered 2021-02-21 – 2021-03-15 (×39): 325 mg via ORAL
  Filled 2021-02-21 (×39): qty 1

## 2021-02-21 NOTE — Plan of Care (Signed)
  Problem: Education: Goal: Knowledge of General Education information will improve Description: Including pain rating scale, medication(s)/side effects and non-pharmacologic comfort measures Outcome: Progressing   Problem: Activity: Goal: Risk for activity intolerance will decrease Outcome: Progressing   Problem: Coping: Goal: Level of anxiety will decrease Outcome: Progressing   Problem: Elimination: Goal: Will not experience complications related to urinary retention Outcome: Progressing   Problem: Pain Managment: Goal: General experience of comfort will improve Outcome: Progressing   Problem: Safety: Goal: Ability to remain free from injury will improve Outcome: Progressing   Problem: Skin Integrity: Goal: Risk for impaired skin integrity will decrease Outcome: Progressing   Problem: Respiratory: Goal: Ability to maintain adequate ventilation will improve Outcome: Progressing   

## 2021-02-21 NOTE — Consult Note (Signed)
WOC Nurse wound follow up: Pt went to the OR yesterday and ortho team applied Vac dressings to left and right shoulder wounds.  Discussed plan of care via secure chat with Dr Dion Saucier.  They plan to change the Vac dressings again next week in the OR.  No further assistance needed from [facility] team at this time for those wounds. Please refer to ortho team for further questions regarding plan of care.  Pt had Vac removed from left chest wounds in the OR yesterday by Dr Daphine Deutscher of the surgical team. Vac has been discontinued from these wounds and moist gauze packing orders have been provided for bedside nurses to perform.  No further assistance needed from [facility] team at this time for those wounds. Please refer to surgical team for further questions regarding plan of care. Please re-consult if further assistance is needed.  Thank-you,  Cammie Mcgee MSN, RN, CWOCN, [place], CNS [phone]

## 2021-02-21 NOTE — Progress Notes (Signed)
Physical Therapy Treatment Patient Details Name: John Ware MRN: [id number] DOB: 02-06-1966 Today's Date: 02/21/2021    History of Present Illness Patient is a 55 y.o. male presented to ED with weakness and per wife generally unwell over the past 3 weeks. He had a positive home COVID test on 01/12/21. He had no respiratory symptoms at the time. He just had fevers that were responsive to APAP. He self isolated at home until 01/20/21 when he was at a party and experienced a fall. bil shoulder and spine xrays negative.  MRI brain=Punctate acute or subacute infarct versus artifact in the high left  frontal lobe. Otherwise, no evidence of recent infarction.  Underwent bilateral MRIs of his shoulders and he is still having fevers.  At the left shoulder was concerned for septic arthritis and left axillary abscess.  Orthopedics was reconsulted as well as IR.  Pt s/p  Left shoulder excisional debridement, deep abscess, with arthrotomy and irrigation and debridement of the joint with biceps tenotomy and excision of necrotic rotator cuff on 02/08/21. Patient s/p Right shoulder excisional debridement, including biceps tenolysis, Evacuation of purulent abscess, right subacromial space, as well as subcoracoid bursa, Right shoulder arthrotomy with irrigation and debridement of joint, Right shoulder open distal clavicle resection on 6/18.  Pt also s/p Incision and drainage of multiple left lateral chest wall abscesses along with excisional debridement on 6/18. PMH: significant of chronic pain, HTN, DM2, depression, spinal stenosis. Second I&D surgeries to bilateral shoulders and left axilla area 6/28. Wound vacs placed on bilateral shoulders.    PT Comments    Pt OOB in recliner on 2L O2 due to PCA. Pt pressed PCA twice during session. Once before ambulation and once after. Assisted with ambulation in hallway +2 for safety (line management) and 2 drains. Sit to stand continues to improve with less assistance needed to  power up from recliner. Min assist +2 for sit to stand.    Follow Up Recommendations  SNF     Equipment Recommendations  Rolling walker with 5" wheels    Recommendations for Other Services Rehab consult     Precautions / Restrictions Precautions Precautions: Fall Precaution Comments: B UE slings for comfort, Bilateral Rotator Cuff Arthopathy Required Braces or Orthoses: Sling Restrictions Weight Bearing Restrictions: No Other Position/Activity Restrictions: per orders 6/18 patient is WBAT B UEs    Mobility  Bed Mobility                    Transfers Overall transfer level: Needs assistance Equipment used: Rolling walker (2 wheeled) Transfers: Sit to/from Stand Sit to Stand: Min assist         General transfer comment: still having some difficulty performing self sit to stand due to recent shoulder surgery.  Ambulation/Gait Ambulation/Gait assistance: Min guard Gait Distance (Feet): 400 Feet Assistive device: Rolling walker (2 wheeled) Gait Pattern/deviations: Step-through pattern;Decreased stride length;Trunk flexed Gait velocity: decr   General Gait Details: cues for posture as pt continues to keep RW too far forward however, distance per pt preference, standing rest break after 200 feet for breathing   Stairs             Wheelchair Mobility    Modified Rankin (Stroke Patients Only)       Balance  Cognition Arousal/Alertness: Awake/alert Behavior During Therapy: WFL for tasks assessed/performed Overall Cognitive Status: Within Functional Limits for tasks assessed Area of Impairment: Problem solving;Attention                               General Comments: AxO x 3 following all commands but at times impulsive with decreased regard to safety      Exercises     General Comments        Pertinent Vitals/Pain Pain Assessment: Faces Faces Pain Scale: Hurts a  little bit Pain Location: lower backand L shoulder Pain Descriptors / Indicators: Discomfort;Grimacing Pain Intervention(s): Limited activity within patient's tolerance;Monitored during session;Other (comment) (pt used PCA before and after session.)    Home Living                      Prior Function            PT Goals (current goals can now be found in the care plan section) Acute Rehab PT Goals Patient Stated Goal: to regain strength PT Goal Formulation: With patient Time For Goal Achievement: 02/23/21 Potential to Achieve Goals: Good Progress towards PT goals: Progressing toward goals    Frequency    Min 3X/week      PT Plan Current plan remains appropriate    Co-evaluation       OT goals addressed during session: Strengthening/ROM      AM-PAC PT "6 Clicks" Mobility   Outcome Measure  Help needed turning from your back to your side while in a flat bed without using bedrails?: A Lot Help needed moving from lying on your back to sitting on the side of a flat bed without using bedrails?: A Lot Help needed moving to and from a bed to a chair (including a wheelchair)?: A Lot Help needed standing up from a chair using your arms (e.g., wheelchair or bedside chair)?: A Lot Help needed to walk in hospital room?: A Little Help needed climbing 3-5 steps with a railing? : A Lot 6 Click Score: 13    End of Session Equipment Utilized During Treatment: Gait belt Activity Tolerance: Patient tolerated treatment well Patient left: in chair;with call bell/phone within reach;with chair alarm set;with family/visitor present Nurse Communication: Mobility status PT Visit Diagnosis: Unsteadiness on feet (R26.81);History of falling (Z91.81);Difficulty in walking, not elsewhere classified (R26.2)     Time: 1400-1435 PT Time Calculation (min) (ACUTE ONLY): 35 min  Charges:  $Gait Training: 23-37 mins                     Alma Friendly, PTA Student  Acute Rehabilitation  Services Pager : [phone] Office : [phone]

## 2021-02-21 NOTE — Progress Notes (Signed)
Came by to see patient this morning, was getting out of chair with MD and multiple staff members. Will come back by this afternoon to check on status of bilateral wound vacs. WBAT bilateral shoulders, patient has very little mobility to start due to bilateral rotator cuff arthropathy. Wound vacs to be kept in place, axilla wound changes per general surgery.   Janine Ores, PA-C

## 2021-02-21 NOTE — Progress Notes (Signed)
Referring Physician(s): Landau,J  Supervising Physician: Irish Lack  Patient Status:  [facility] - In-pt  Chief Complaint: Shoulder/back pain   Subjective: Pt sitting up in chair ; eating lunch; states he is doing ok; rt shoulder feels better than left   Allergies: Tizanidine  Medications: Prior to Admission medications   Medication Sig Start Date End Date Taking? Authorizing Provider  acetaminophen (TYLENOL) 325 MG tablet Take 650-975 mg by mouth every 6 (six) hours as needed for mild pain, fever or headache.   Yes [provider]  amLODipine (NORVASC) 5 MG tablet Take 1 tablet (5 mg total) by mouth daily. 06/23/20  Yes Hawks, Christy A, FNP  buPROPion (WELLBUTRIN XL) 150 MG 24 hr tablet TAKE (1) TABLET TWICE A DAY. Patient taking differently: Take 150 mg by mouth 2 (two) times daily. 01/19/21  Yes Hawks, Christy A, FNP  busPIRone (BUSPAR) 5 MG tablet TAKE 1 TABLET 3 TIMES A DAY Patient taking differently: Take 5 mg by mouth 3 (three) times daily. 11/17/20  Yes Hawks, Christy A, FNP  gabapentin (NEURONTIN) 800 MG tablet TAKE (1) TABLET THREE TIMES DAILY. Patient taking differently: Take 800 mg by mouth 3 (three) times daily. 01/19/21  Yes Jannifer Rodney A, FNP  metFORMIN (GLUCOPHAGE XR) 750 MG 24 hr tablet Take 2 tablets (1,500 mg total) by mouth daily with breakfast. 06/23/20 12/20/20 Yes Hawks, Christy A, FNP  methocarbamol (ROBAXIN) 500 MG tablet Take 1 tablet (500 mg total) by mouth 2 (two) times daily as needed for muscle spasms. 01/20/21  Yes Caccavale, Sophia, PA-C  oxyCODONE ER (XTAMPZA ER) 18 MG C12A Take 1 tablet by mouth every 12 (twelve) hours. 11/26/19  Yes [provider]  oxyCODONE-acetaminophen (PERCOCET) 10-325 MG tablet Take 1 tablet by mouth 3 (three) times daily as needed for pain. 01/25/21  Yes [provider]  ramipril (ALTACE) 10 MG capsule TAKE (1) CAPSULE DAILY Patient taking differently: Take 10 mg by mouth daily. 06/23/20  Yes Hawks,  Christy A, FNP  sertraline (ZOLOFT) 100 MG tablet Take 1 tablet (100 mg total) by mouth daily. 06/23/20  Yes Hawks, Christy A, FNP  Vitamin D, Ergocalciferol, (DRISDOL) 1.25 MG (50000 UNIT) CAPS capsule Take 50,000 Units by mouth once a week. 01/25/21  Yes [provider]  diclofenac Sodium (VOLTAREN) 1 % GEL Apply 2 g topically 4 (four) times daily. Patient not taking: Reported on 01/31/2021 01/20/21   Caccavale, Sophia, PA-C  empagliflozin (JARDIANCE) 10 MG TABS tablet Take 1 tablet (10 mg total) by mouth daily before breakfast. Patient not taking: Reported on 01/31/2021 10/10/20   Junie Spencer, FNP  atorvastatin (LIPITOR) 20 MG tablet Take 1 tablet (20 mg total) by mouth daily. Patient not taking: Reported on 06/23/2020 02/29/20 08/23/20  Junie Spencer, FNP     Vital Signs: BP (!) 97/57 (BP Location: Right Arm)   Pulse 77   Temp (!) 97.5 F (36.4 C)   Resp 16   Ht 5\' 10"  (1.778 m)   Wt 214 lb 8.1 oz (97.3 kg)   SpO2 100%   BMI 30.78 kg/m   Physical Exam awake/alert; left neck/Ward fossa IR drain removed yesterday during surgery; wound vac now in region; R/L flank drains in place; dressings dry, minimal tenderness, OP 30 cc each beige colored fluid  Imaging: CT CHEST WO CONTRAST  Result Date: 02/18/2021 CLINICAL DATA:  Left axillary abscess status post incision and drainage. Follow-up examination. EXAM: CT CHEST WITHOUT CONTRAST TECHNIQUE: Multidetector CT imaging of the  chest was performed following the standard protocol without IV contrast. COMPARISON:  02/13/2021, 02/09/2021 FINDINGS: Cardiovascular: No significant coronary artery calcification. Global cardiac size within normal limits. Hypoattenuation of the cardiac blood pool is again identified and is unchanged, in keeping with at least mild anemia. No pericardial effusion. Central pulmonary arteries are of normal caliber. Thoracic aorta is unremarkable. Mediastinum/Nodes: Bulky adenopathy within the left axilla is unchanged.  No mediastinal or hilar adenopathy identified. No supraclavicular adenopathy identified though multiple small shotty left supraclavicular lymph nodes are identified, possibly reactive in nature. Visualized thyroid unremarkable. The esophagus is unremarkable. Lungs/Pleura: Mild bibasilar atelectasis.  Stable pulmonary nodules: 7 mm, subpleural left upper lobe, axial image # 110/7 5 mm, left upper lobe, axial image # 130/7 9 mm, lingula, axial image # 139/7 Small left pleural effusion is unchanged. No pneumothorax. Central airways are widely patent. Upper Abdomen: No acute abnormality. Musculoskeletal: Penrose type drain again noted within the inferior left axilla and posterolateral left chest wall, similar prior examination. Near complete resolution of subcutaneous debris and fluid within the left posterolateral chest wall adjacent to the surgical incision noted at axial image # 70. Mottled gas within the high left axilla adjacent to the left axillary vein, axial image # 37, likely represents a a necrotic lymph node and appears stable since prior examination. There is mottled gas and fluid within the joint capsule of the left shoulder, as seen on prior examination, now with extension of the gas in fluid into the subcutaneous if tissues of the left shoulder anteriorly, best appreciated on axial image # 34/2. Fluid is again seen tracking from the a left glenohumeral joint capsule into the subscapular space and extending superiorly to surround the apical left thoracic cage in extend into the supraclavicular region. A pigtail drainage catheter has been placed in this collection and there has been slight interval decrease in the amount of fluid noted superiorly, best appreciated on axial image # 20/2. Right shoulder effusion is again identified. Punctate foci of gas are again seen within the soft tissues anterior to the right shoulder within the pectoralis and deltoid musculature, similar to that noted on prior  examination. IMPRESSION: Near complete resolution of fluid in debris within the inferior, posterolateral left chest wall adjacent to the surgical incision and Penrose type surgical drainage catheter. Stable bulky lymphadenopathy within the left axilla with at least 1 necrotic lymph node noted superiorly, similar to prior examination. Gas and fluid within the left shoulder joint capsule, similar to prior examination, now tracking anteriorly into the subcutaneous soft tissues anterior to the left shoulder as well as, similar prior examination, tracking posterior superiorly into the subscapular space and subsequently into the supraclavicular region. Interval placement of a pigtail type drainage catheter within the supraclavicular region with partial evacuation of fluid within this region. Stable right shoulder effusion and adjacent punctate gas and thickening of the pectoralis and deltoid musculature anterior to the coracoid process. Electronically Signed   By: Helyn Numbers MD   On: 02/18/2021 23:49   CT SHOULDER LEFT WO CONTRAST  Result Date: 02/19/2021 CLINICAL DATA:  Septic arthritis suspected, shoulder, xray done EXAM: CT OF THE UPPER LEFT EXTREMITY WITHOUT CONTRAST TECHNIQUE: Multidetector CT imaging of the upper left extremity was performed according to the standard protocol. COMPARISON:  Left shoulder CT 02/13/2021, left shoulder MRI 02/07/2021 FINDINGS: Bones/Joint/Cartilage No evidence of acute fracture. No new osseous erosion, destruction, or periosteal reaction. Persistent gas containing distension of the subacromial-subdeltoid bursa and small glenohumeral joint effusion. Ligaments  Suboptimally assessed by CT. Muscles and Tendons There is a new drain placed into a supraclavicular/lower left neck collection, which is significantly decreased in size. There is visible soft tissue gas and fluid noted posteriorly in the neck (axial images 30, 35, 39). Persistent gas/fluid collecting along the intercostal  tissues, left axilla, and upper back deep soft tissues (for examples, see axial images 46, 73, and 57-58). - Left axillary collection measures 3.5 x 2.0 cm in the axial plane and 6.7 cm in craniocaudal extent (axial image 69, coronal image 62). -Additional left axillary collection measures 4.9 x 2.7 cm in the axial plane and 4.5 cm in craniocaudal extent (axial image 86, coronal image 49). There is increased gas and fluid collecting in the subcutaneous tissues anteriorly along the superficial surface of the anterior deltoid/pectoralis muscle (axial images 47-60), which may connect with the subacromial-subdeltoid bursa collection on axial soft tissue image 57. Soft tissues Persistent subcutaneous soft tissue swelling. There is a surgical drain along the left posterolateral soft tissues at the level of the inferior angle of the scapula, without well-defined collection in this location. There is a small left pleural effusion and left lower lung atelectasis. IMPRESSION: New drain placed into a supraclavicular collection, which is significantly decreased in size. Persistent extensive soft tissue abscess along the left chest wall/axilla, and visible gas/fluid collecting posteriorly along the lower neck and upper back deep soft tissues, reference images and measurements above. Persistent gas containing distension of the subacromial-subdeltoid bursa and small glenohumeral joint effusion. New fluid and gas collecting superficially in the subcutaneous tissues along the surface of the anterior deltoid/distal pectoralis muscle, which may connect with the subacromial-subdeltoid bursa collection. No new acute osseous abnormality to suggest osteomyelitis. Electronically Signed   By: Caprice Renshaw   On: 02/19/2021 09:08   CT SHOULDER RIGHT WO CONTRAST  Result Date: 02/19/2021 CLINICAL DATA:  Septic arthritis suspected, shoulder, xray done EXAM: CT OF THE UPPER RIGHT EXTREMITY WITHOUT CONTRAST TECHNIQUE: Multidetector CT imaging  of the upper right extremity was performed according to the standard protocol. COMPARISON:  CT 02/13/2021 FINDINGS: Bones/Joint/Cartilage Postsurgical changes along the right shoulder, including AC joint resection. There is persistent soft tissue fluid and gas anteriorly along the clavicular head of the pectoralis major, slightly increased from prior exam. There is no well-defined rim enhancing collection at this time. There is persistent small glenohumeral joint effusion and mild distension of the subacromial-subdeltoid bursa and subcoracoid bursa. No new osseous changes to suggest osteomyelitis. Ligaments Suboptimally assessed by CT. Muscles and Tendons Findings of full-thickness rotator cuff tear. Soft tissues Mild soft tissue swelling along the shoulder. No lymphadenopathy. Linear atelectasis in the right lower lobe. IMPRESSION: Postsurgical changes of right AC joint debridement/resection. Persistent ill-defined soft tissue gas and fluid anteriorly along the clavicular head of the pectoralis major, which is slightly increased from prior exam, which could be developing infection versus postsurgical change. Persistent small glenohumeral joint effusion, and mild distension of the subacromial-subdeltoid bursa and subcoracoid bursa. No new osseous changes to suggest osteomyelitis. Electronically Signed   By: Caprice Renshaw   On: 02/19/2021 08:39    Labs:  CBC: Recent Labs    02/17/21 0546 02/18/21 0508 02/19/21 0431 02/21/21 0505  WBC 9.6 9.7 8.7 8.8  HGB 7.5* 7.4* 7.7* 7.6*  HCT 23.1* 22.9* 23.5* 24.2*  PLT 573* 606* 630* 630*    COAGS: Recent Labs    01/31/21 1059 02/01/21 0022  INR 1.4* 1.3*  APTT 24  --  BMP: Recent Labs    02/25/20 0956 06/23/20 1630 10/09/20 1155 01/31/21 0854 02/18/21 0508 02/19/21 0431 02/20/21 0815 02/21/21 0505  NA 139 138 137   < > 133* 131* 134* 131*  K 5.0 4.6 4.9   < > 4.2 4.3 4.3 4.7  CL 99 97 96   < > 95* 95* 95* 95*  CO2 23 20 22    < > 30 28 30  27   GLUCOSE 216* 413* 308*   < > 128* 100* 119* 371*  BUN 14 12 11    < > 6 6 7 10   CALCIUM 9.7 9.6 10.2   < > 8.3* 8.0* 8.3* 8.0*  CREATININE 0.88 1.08 0.89   < > 0.46* 0.48* 0.50* 0.56*  GFRNONAA 99 78 98   < > >60 >60 >60 >60  GFRAA 114 91 113  --   --   --   --   --    < > = values in this interval not displayed.    LIVER FUNCTION TESTS: Recent Labs    02/11/21 0539 02/12/21 0535 02/13/21 0551 02/14/21 0212  BILITOT 0.7 0.9 0.7 0.7  AST 25 29 49* 53*  ALT 15 17 26 30   ALKPHOS 55 61 60 53  PROT 4.9* 5.5* 5.2* 5.2*  ALBUMIN 1.9* 2.0* 1.8* 1.8*    Assessment and Plan: 55 yo male with sepsis/MSSA bacteremia secondary to left chest fluid collection/bilateral shoulder septic arthritis despite multiple OR debridements ; further complicated by development of left neck/supraclavicular fluid collection and bilateral psoas muscle fluid collections concerning for abscesses; s/p drain placement x 3 with Dr. Grace IsaacWatts on 6/23 ; afebrile; wbc NL, HGB 7.6(7.7), creat 0.56, left Traer fossa drain removed yesterday during surgery- wound vac in place; cont current tx for now; once drain OP < 10 cc/24 hr or if WBC increases consider f/u imaging of psoas regions   Electronically Signed: D. Jeananne RamaKevin Valery Amedee, PA-C 02/21/2021, 1:28 PM   I spent a total of 15 Minutes at the the patient's bedside AND on the patient's hospital floor or unit, greater than 50% of which was counseling/coordinating care for bilateral psoas/RP abscess drains    Patient ID: Blane OharaMartie L Komperda, male   DOB: 07/31/1966, 55 y.o.   MRN: [id number]

## 2021-02-21 NOTE — Progress Notes (Addendum)
Progress Note    John Ware   ONG:[id number]  DOB: 24-Aug-1966  DOA: 01/31/2021     21  PCP: Sharion Balloon, FNP  CC: Weakness, ongoing pain  Hospital Course: John Ware is a 55 y.o. male with medical history significant of chronic pain, HTN, DM2.  He had at home positive COVID test on 01/12/2021.  He had no respiratory symptoms at this time but he did have fevers that was responsive to Tylenol.  Reportedly he apparently fell out of his chair and hurt his left shoulder and he was evaluated by the ED and they found no fracture and he was given a sling and told to follow-up with orthopedics.  Since that time the patient's wife reports that he has had general body aches and pain and has increased symptoms that are not responsive to his chronic pain meds.  He is also had poor appetite during this time and has been unable to walk for last few days.  Because of these constantly some symptoms he is brought to the ED for further evaluation.  In the ED is found to have a sodium of 113 and a glucose of 339 and a WBC of 20.5 as well as a lactic acid of 3.8.  He started on broad-spectrum antibiotics and given fluids.  Soon after his blood cultures became positive for 4-4 MSSA.  He was transitioned to IV Ancef.  With further work-up and confirmation he was also found to have a large fluid collection of left lateral posterior chest and back.  He did have tenderness in his back.  He underwent MRI T-spine and CT of the chest abdomen pelvis.  There is no evidence of any osteomyelitis or discitis or other underlying infection on MRI T-spine.  CT of the chest did show a large multiloculated fluid collection.  IR was consulted and he underwent an ultrasound-guided aspiration was sent for culturing.  CT was concerning for evidence of septic emboli but 2D echo did not reveal any evidence of an endocarditis.  Cardiology was consulted for TEE per ID recommendations and he underwent TEE on 02/05/2021 which found a  large tricuspid valve vegetation with mild tricuspid regurg.  Subsequently he still complained of some shoulder pain so he underwent bilateral MRIs of his shoulders and he is still having fevers.  At the left shoulder was concerned for septic arthritis and left axillary abscess.  Orthopedics was reconsulted as well as IR and the orthopedic surgeon is planning to do a washout tomorrow in edition IR is going to place a drain on the residual fluid collection that may exist after washout.  Because of the surgery ID changed the patient to nafcillin however recommending continuing cefazolin at this time. ESR and CRP and both are elevated.   Patient underwent on 02/08/21 left shoulder excisional debridement, deep abscess with arthrotomy and irrigation debridement of the joint with biceps tenotomy and excision of the necrotic rotator cuff.  Patient is not medically ready to be D/C'd to CIR.   Postoperatively he had a CT scan and repeated and showed a marked increase in size of the abscess collection at the left lateral chest wall with persistent abscess again seen in the subscapular extending cranial to the apex of the left hemithorax.  Since he is post I&D it was unclear about how much of the increase in size of the abscess was due to the postsurgical changes progression of the abscess collection.  Patient did have some small bilateral pleural  effusions and bibasilar atelectasis as well as reactive left axillary adenopathy.  Post surgery patient was given Decadron 10 mg last night.  Because of the changes on his repeat CT scan with a left chest wall abscess with gas on CT General surgery was consulted and they are planning on taking the patient back to the OR to address his right shoulder along with a large left lateral chest wall abscess and this was done 6/18.  ID recommends continuing IV cefazolin currently  6/29: Patient underwent extensive debridement of bilateral shoulder joints and a large wound VAC placement  by orthopedic on 02/20/2021. He also underwent axillary exploration and drainage of multiple abscesses involving the chest wall and back-multiple new drains were placed by general surgery. Necrotic lymph node was sent for pathology.  Interval History:  Patient was feeling little improved after that extensive debridement yesterday.  Continue to use PCA pump regularly but stating that pain is well controlled with that.  Does not want to wean off from PCA at this time.  Eating and drinking okay.  Wife at bedside.  Assessment & Plan: Old records reviewed in assessment of this patient. Complicated course of admission with multiple abscesses requiring multiple procedures. Please see prior notes for more detail.  Severe sepsis/MSSA bacteremia/tricuspid endocarditis. Initially met severe sepsis criteria with fever, tachycardia, tachypnea, leukocytosis and lactic acidosis. Initial blood cultures with MSSA, repeat blood cultures done on 02/01/2021 remain negative. TEE done on 6/13 with large tricuspid vegetation.  Large left posterior wall and bilateral psoas muscle abscesses, bilateral septic shoulder joints, osteomyelitis and discitis.Marland Kitchen MRI brain done on 6/12 due to his complaint of seeing floaters was negative for endophthalmitis or any other acute abnormality. 6/28: Underwent extensive debridement of bilateral shoulder joints and axillary exploration with incision and drainage of multiple other abscesses.  Pending pathology from necrotic lymph node. -Continue with Ancef -Continue pain management.  Anemia of chronic disease.  Hemoglobin at 7.6 today.  Anemia panel consistent with anemia of chronic disease with mild iron deficiency. -Add p.o. iron supplement. -Monitor hemoglobin -Transfuse if below 7  DKA.  Resolved.  Type 2 diabetes mellitus.  A1c of 10.2 on 01/31/2021.  CBG elevated -Increase Lantus to 20 units daily. -Continue with SSI along with mealtime coverage of 4 units.  Hyponatremia.   Resolved.  Corrected sodium of 135 -Continue to monitor  Ascending aorta dilatation (HCC) - TTE on 6/10 notes mild dilation of ascending aorta, 38 mm - follow TEE results as well: normal thoracic and ascending aorta noted on 02/05/21   Anxiety - Wellbutrin and Zoloft on hold for now in setting of hyponatremia   Antimicrobials: Cefepime 01/31/21 x 1 Rocephin 01/31/21 x 1 Vanc 01/31/21 x 1 Ancef 02/01/21 >> current  DVT prophylaxis: SCDs Start: 02/20/21 2001 enoxaparin (LOVENOX) injection 40 mg Start: 02/11/21 1000 SCDs Start: 02/08/21 2116   Code Status:   Code Status: Full Code Family Communication:   Disposition Plan: Status is: Inpatient  Remains inpatient appropriate because:Ongoing diagnostic testing needed not appropriate for outpatient work up, IV treatments appropriate due to intensity of illness or inability to take PO, and Inpatient level of care appropriate due to severity of illness  Dispo: The patient is from: Home              Anticipated d/c is to:  SNF              Patient currently is not medically stable to d/c.   Difficult to place patient No  Risk of unplanned readmission score: Unplanned Admission- Pilot do not use: 25.32   Objective: Blood pressure 102/71, pulse 85, temperature 98 F (36.7 C), resp. rate 16, height _0  (1.778 m), weight 97.3 kg, SpO2 100 %.  Examination:  General.  Well-developed gentleman, in no acute distress.  Multiple drain and wound vacs in place. Pulmonary.  Lungs clear bilaterally, normal respiratory effort. CV.  Regular rate and rhythm, no JVD, rub or murmur. Abdomen.  Soft, nontender, nondistended, BS positive. CNS.  Alert and oriented x3.  No focal neurologic deficit. Extremities.  No edema, no cyanosis, pulses intact and symmetrical. Psychiatry.  Judgment and insight appears normal.   Consultants:  ID Orthopedic surgery General surgery  Procedures:  IR drainage of back fluid collection, 6/10 Extensive debridement of  bilateral shoulder joint-6/28 Left axillary exploration and incision and drainage of multiple abscesses-6/28  Data Reviewed: I have personally reviewed following labs and imaging studies Results for orders placed or performed during the hospital encounter of 01/31/21 (from the past 24 hour(s))  Basic metabolic panel     Status: Abnormal   Collection Time: 02/20/21  8:15 AM  Result Value Ref Range   Sodium 134 (L) 135 - 145 mmol/L   Potassium 4.3 3.5 - 5.1 mmol/L   Chloride 95 (L) 98 - 111 mmol/L   CO2 30 22 - 32 mmol/L   Glucose, Bld 119 (H) 70 - 99 mg/dL   BUN 7 6 - 20 mg/dL   Creatinine, Ser 0.50 (L) 0.61 - 1.24 mg/dL   Calcium 8.3 (L) 8.9 - 10.3 mg/dL   GFR, Estimated >60 >60 mL/min   Anion gap 9 5 - 15  Glucose, capillary     Status: Abnormal   Collection Time: 02/20/21  8:18 AM  Result Value Ref Range   Glucose-Capillary 119 (H) 70 - 99 mg/dL  Glucose, capillary     Status: None   Collection Time: 02/20/21  2:15 PM  Result Value Ref Range   Glucose-Capillary 99 70 - 99 mg/dL  Glucose, capillary     Status: Abnormal   Collection Time: 02/20/21  7:09 PM  Result Value Ref Range   Glucose-Capillary 112 (H) 70 - 99 mg/dL  Glucose, capillary     Status: Abnormal   Collection Time: 02/20/21  8:14 PM  Result Value Ref Range   Glucose-Capillary 170 (H) 70 - 99 mg/dL  CBC with Differential/Platelet     Status: Abnormal   Collection Time: 02/21/21  5:05 AM  Result Value Ref Range   WBC 8.8 4.0 - 10.5 K/uL   RBC 2.56 (L) 4.22 - 5.81 MIL/uL   Hemoglobin 7.6 (L) 13.0 - 17.0 g/dL   HCT 24.2 (L) 39.0 - 52.0 %   MCV 94.5 80.0 - 100.0 fL   MCH 29.7 26.0 - 34.0 pg   MCHC 31.4 30.0 - 36.0 g/dL   RDW 14.6 11.5 - 15.5 %   Platelets 630 (H) 150 - 400 K/uL   nRBC 0.0 0.0 - 0.2 %   Neutrophils Relative % 88 %   Neutro Abs 7.8 (H) 1.7 - 7.7 K/uL   Lymphocytes Relative 8 %   Lymphs Abs 0.7 0.7 - 4.0 K/uL   Monocytes Relative 3 %   Monocytes Absolute 0.3 0.1 - 1.0 K/uL   Eosinophils  Relative 0 %   Eosinophils Absolute 0.0 0.0 - 0.5 K/uL   Basophils Relative 0 %   Basophils Absolute 0.0 0.0 - 0.1 K/uL   Immature Granulocytes 1 %  Abs Immature Granulocytes 0.04 0.00 - 0.07 K/uL  Magnesium     Status: None   Collection Time: 02/21/21  5:05 AM  Result Value Ref Range   Magnesium 1.8 1.7 - 2.4 mg/dL  Basic metabolic panel     Status: Abnormal   Collection Time: 02/21/21  5:05 AM  Result Value Ref Range   Sodium 131 (L) 135 - 145 mmol/L   Potassium 4.7 3.5 - 5.1 mmol/L   Chloride 95 (L) 98 - 111 mmol/L   CO2 27 22 - 32 mmol/L   Glucose, Bld 371 (H) 70 - 99 mg/dL   BUN 10 6 - 20 mg/dL   Creatinine, Ser 0.56 (L) 0.61 - 1.24 mg/dL   Calcium 8.0 (L) 8.9 - 10.3 mg/dL   GFR, Estimated >60 >60 mL/min   Anion gap 9 5 - 15  Glucose, capillary     Status: Abnormal   Collection Time: 02/21/21  7:18 AM  Result Value Ref Range   Glucose-Capillary 323 (H) 70 - 99 mg/dL    Recent Results (from the past 240 hour(s))  Culture, blood (single)     Status: None   Collection Time: 02/13/21  5:51 AM   Specimen: BLOOD  Result Value Ref Range Status   Specimen Description   Final    BLOOD RIGHT ANTECUBITAL Performed at [facility], [place] [address], [place], [place] [address]    Special Requests   Final    BOTTLES DRAWN AEROBIC ONLY Blood Culture adequate volume Performed at [place] [address], [place], [place] [address]    Culture   Final    NO GROWTH 5 DAYS Performed at [facility], [place] [address], [place], [place] [address]    Report Status 02/18/2021 FINAL  Final  Culture, blood (single)     Status: None   Collection Time: 02/14/21  2:12 AM   Specimen: BLOOD  Result Value Ref Range Status   Specimen Description   Final    BLOOD RIGHT ANTECUBITAL Performed at [place] [address] [place], [place] [address]    Special Requests   Final    BOTTLES DRAWN AEROBIC AND ANAEROBIC Blood Culture  adequate volume Performed at [place] [address], [place], [place] [address]    Culture   Final    NO GROWTH 5 DAYS Performed at [facility], [place] [address] [place], [place] [address]    Report Status 02/19/2021 FINAL  Final  Aerobic/Anaerobic Culture w Gram Stain (surgical/deep wound)     Status: None (Preliminary result)   Collection Time: 02/15/21  5:02 PM   Specimen: Abscess  Result Value Ref Range Status   Specimen Description   Final    ABSCESS LEFT SHOULDER Performed at [place] [address], [place], [place] [address]    Special Requests   Final    NONE Performed at [name], [place] [address], [place], [place] [address]    Gram Stain   Final    NO WBC SEEN NO ORGANISMS SEEN Performed at [facility], [place] [address], [place], [place] [address]    Culture   Final    FEW STAPHYLOCOCCUS AUREUS SUSCEPTIBILITIES PERFORMED ON PREVIOUS CULTURE WITHIN THE LAST 5 DAYS. NO ANAEROBES ISOLATED; CULTURE IN PROGRESS FOR 5 DAYS    Report Status PENDING  Incomplete  Aerobic/Anaerobic Culture w Gram Stain (surgical/deep wound)  Status: None (Preliminary result)   Collection Time: 02/15/21  5:06 PM   Specimen: Abscess  Result Value Ref Range Status   Specimen Description   Final    ABSCESS POST US GUIDED LEFT SUPRAVICULAR ABSCESS DRAIN [place] Performed at [facility], [place] [address], [place], [place] [address]    Special Requests   Final    NONE Performed at [facility], [place] [address], [place], [place] [address]    Gram Stain   Final    FEW WBC PRESENT,BOTH PMN AND MONONUCLEAR FEW GRAM POSITIVE COCCI IN PAIRS Performed at [facility], [place] [address] [place],  [address]    Culture   Final    ABUNDANT STAPHYLOCOCCUS AUREUS NO ANAEROBES ISOLATED; CULTURE IN PROGRESS FOR 5 DAYS    Report Status PENDING  Incomplete   Organism ID,  Bacteria STAPHYLOCOCCUS AUREUS  Final      Susceptibility   Staphylococcus aureus - MIC*    CIPROFLOXACIN >=8 RESISTANT Resistant     ERYTHROMYCIN >=8 RESISTANT Resistant     GENTAMICIN <=0.5 SENSITIVE Sensitive     OXACILLIN 0.5 SENSITIVE Sensitive     TETRACYCLINE <=1 SENSITIVE Sensitive     VANCOMYCIN <=0.5 SENSITIVE Sensitive     TRIMETH/SULFA <=10 SENSITIVE Sensitive     CLINDAMYCIN <=0.25 SENSITIVE Sensitive     RIFAMPIN <=0.5 SENSITIVE Sensitive     Inducible Clindamycin NEGATIVE Sensitive     * ABUNDANT STAPHYLOCOCCUS AUREUS     Radiology Studies: No results found. CT SHOULDER RIGHT WO CONTRAST  Final Result    CT SHOULDER LEFT WO CONTRAST  Final Result    CT CHEST WO CONTRAST  Final Result    CT IMAGE GUIDED DRAINAGE BY PERCUTANEOUS CATHETER  Final Result    CT IMAGE GUIDED DRAINAGE BY PERCUTANEOUS CATHETER  Final Result    [place] IMAGE GUIDED FLUID DRAIN BY CATHETER  Final Result    MR CERVICAL SPINE W WO CONTRAST  Final Result    MR Lumbar Spine W Wo Contrast  Final Result    MR THORACIC SPINE W WO CONTRAST  Final Result    DG CHEST PORT 1 VIEW  Final Result    CT SHOULDER LEFT WO CONTRAST  Final Result    CT SHOULDER RIGHT WO CONTRAST  Final Result    CT CHEST WO CONTRAST  Final Result    DG Chest Port 1 View  Final Result    CT SHOULDER LEFT WO CONTRAST  Final Result    CT CHEST WO CONTRAST  Final Result    DG Knee 1-2 Views Right  Final Result    MR Shoulder Right W Wo Contrast  Final Result    MR Shoulder Left W Wo Contrast  Final Result    MR BRAIN WO CONTRAST  Final Result    IR US Guide Bx Asp/Drain  Final Result    CT CHEST ABDOMEN PELVIS W CONTRAST  Final Result    DG Shoulder Left  Final Result    DG Shoulder Right  Final Result    MR THORACIC SPINE W WO CONTRAST  Final Result    DG Chest Port 1 View  Final Result      Scheduled Meds:  acetaminophen  500 mg Oral QID   enoxaparin (LOVENOX) injection   40 mg Subcutaneous Q24H   gabapentin  400 mg Oral TID   HYDROmorphone   Intravenous Q4H   insulin aspart  0-15 Units Subcutaneous TID  WC   insulin aspart  0-5 Units Subcutaneous QHS   insulin aspart  4 Units Subcutaneous TID WC   insulin glargine  15 Units Subcutaneous Daily   mouth rinse  15 mL Mouth Rinse BID   polyethylene glycol  17 g Oral Daily   senna-docusate  1 tablet Oral BID   sodium chloride flush  5 mL Intracatheter Q8H   PRN Meds: sodium chloride, alum & mag hydroxide-simeth, diphenhydrAMINE **OR** diphenhydrAMINE, docusate sodium, HYDROmorphone (DILAUDID) injection, lip balm, magic mouthwash w/lidocaine, methocarbamol, metoCLOPramide **OR** metoCLOPramide (REGLAN) injection, naloxone **AND** sodium chloride flush, ondansetron (ZOFRAN) IV, oxyCODONE, traZODone Continuous Infusions:  sodium chloride Stopped (02/07/21 0508)    ceFAZolin (ANCEF) IV 2 g (02/21/21 0130)     LOS: 21 days  Time spent: Greater than 50% of the 35 minute visit was spent in counseling/coordination of care for the patient as laid out in the A&P.   Lorella Nimrod, MD [facility] 02/21/2021, 7:50 AM

## 2021-02-21 NOTE — Progress Notes (Signed)
Occupational Therapy Treatment Patient Details Name: John Ware MRN: [id number] DOB: [birth year] Today's Date: 02/21/2021    History of present illness Patient is a 55 y.o. male presented to ED with weakness and per wife generally unwell over the past 3 weeks. He had a positive home COVID test on 01/12/21. He had no respiratory symptoms at the time. He just had fevers that were responsive to APAP. He self isolated at home until 01/20/21 when he was at a party and experienced a fall. bil shoulder and spine xrays negative.  MRI brain=Punctate acute or subacute infarct versus artifact in the high left  frontal lobe. Otherwise, no evidence of recent infarction.  Underwent bilateral MRIs of his shoulders and he is still having fevers.  At the left shoulder was concerned for septic arthritis and left axillary abscess.  Orthopedics was reconsulted as well as IR.  Pt s/p  Left shoulder excisional debridement, deep abscess, with arthrotomy and irrigation and debridement of the joint with biceps tenotomy and excision of necrotic rotator cuff on 02/08/21. Patient s/p Right shoulder excisional debridement, including biceps tenolysis, Evacuation of purulent abscess, right subacromial space, as well as subcoracoid bursa, Right shoulder arthrotomy with irrigation and debridement of joint, Right shoulder open distal clavicle resection on 6/18.  Pt also s/p Incision and drainage of multiple left lateral chest wall abscesses along with excisional debridement on 6/18. PMH: significant of chronic pain, HTN, DM2, depression, spinal stenosis. Second I&D surgeries to bilateral shoulders and left axilla area 6/28. Wound vacs placed on bilateral shoulders.   OT comments  Treatment focused on therapeutic exercises for bilateral upper extremities in order to maintain ROM and reduce pain. Pain tolerance improved today due to PCA. Patient educated on performing exercises 2-3 x a day to maintain glenohumeral joint mobility. Patient  verbalized understanding.    Follow Up Recommendations  SNF    Equipment Recommendations  3 in 1 bedside commode    Recommendations for Other Services      Precautions / Restrictions Precautions Precautions: Fall Precaution Comments: B UE slings for comfort, Bilateral Rotator Cuff Arthopathy Required Braces or Orthoses: Sling Restrictions Weight Bearing Restrictions: No Other Position/Activity Restrictions: per orders 6/18 patient is WBAT B UEs          Cognition Arousal/Alertness: Awake/alert Behavior During Therapy: WFL for tasks assessed/performed Overall Cognitive Status: Within Functional Limits for tasks assessed                                          Exercises Other Exercises Other Exercises: R shoulder: external rotation reps x 10, active assist forward flexion to approx 70 degrees x 10 Other Exercises: L shoulder: external rotation reps x 10, active assist to approx 30 degrees forward flexion x 10   Shoulder Instructions       General Comments      Pertinent Vitals/ Pain       Pain Assessment: Faces Faces Pain Scale: Hurts little more Pain Location: lower backand L shoulder Pain Descriptors / Indicators: Discomfort;Grimacing Pain Intervention(s): Limited activity within patient's tolerance;Monitored during session;Premedicated before session;PCA encouraged;Repositioned  Home Living                                          Prior Functioning/Environment  Frequency  Min 2X/week        Progress Toward Goals  OT Goals(current goals can now be found in the care plan section)  Progress towards OT goals: Progressing toward goals  Acute Rehab OT Goals Patient Stated Goal: to regain strength OT Goal Formulation: With patient Time For Goal Achievement: 02/28/21 Potential to Achieve Goals: Good  Plan Discharge plan remains appropriate    Co-evaluation          OT goals addressed during  session: Strengthening/ROM      AM-PAC OT "6 Clicks" Daily Activity     Outcome Measure   Help from another person eating meals?: A Little Help from another person taking care of personal grooming?: A Little Help from another person toileting, which includes using toliet, bedpan, or urinal?: A Lot Help from another person bathing (including washing, rinsing, drying)?: A Lot Help from another person to put on and taking off regular upper body clothing?: A Lot Help from another person to put on and taking off regular lower body clothing?: A Lot 6 Click Score: 14    End of Session    OT Visit Diagnosis: Unsteadiness on feet (R26.81);Other abnormalities of gait and mobility (R26.89);History of falling (Z91.81);Muscle weakness (generalized) (M62.81);Pain   Activity Tolerance Patient tolerated treatment well   Patient Left in chair;with call bell/phone within reach;with nursing/sitter in room;with family/visitor present;with chair alarm set   Nurse Communication  (okay to see per Rn)        Time: 5625-6389 OT Time Calculation (min): 20 min  Charges: OT General Charges $OT Visit: 1 Visit OT Treatments $Therapeutic Exercise: 8-22 mins  Waldron Session, OTR/L Acute Care Rehab Services  Office [phone] Pager: [phone]    Kelli Churn 02/21/2021, 1:05 PM

## 2021-02-21 NOTE — Progress Notes (Signed)
[facility] Infectious Diseases Follow Up Note  Patient Identification: Patient Name: John Ware MRN: [id number] Admit Date: 01/31/2021  7:44 AM Age: 55 y.o.Today's Date: 02/21/2021   Reason for Visit: MSSA infection  Active Problems:   Right knee pain   Diabetes mellitus (HCC)   Hyponatremia   Pressure injury of skin   HTN (hypertension)   Anxiety   Left shoulder pain   Ascending aorta dilatation (HCC)   Vision changes   Endocarditis   Right shoulder pain   Chest wall abscess   Obesity   Normocytic anemia   Thrombocytosis   Psoas muscle abscess (HCC)  Antibiotics: Cefazolin 6/8-current                      Lines/Tubes: PIV's  Interval Events: Remains to be afebrile, no leukocytosis.  Went to the OR for repeat I&D yesterday   Assessment Disseminated MSSA infection (bacteremia, TV endocarditis with pulmonary septic emboli, left shoulder septic arthritis with subscapular/subacromial abscess+ right shoulder GH, AC joint septic arthritis, periscapular abscess with osteomyelitis, T3-T6 and L1-L3 discitis and osteomyelitis with bilateral psoas abscesses)  H/o Of prior PLIF  Acute blood loss anemia- tansfusion per primary   Operative Interventions  Status post OR on 6/16 and 6/18.  OR cultures 6/16 and 6/18 growing MSSA Status post left supraclavicular and right and left paraspinal drain by VIR on 6/23.  Cultures growing MSSA Status post exploration of left axilla and other open sores and excisional debridement of subdeltoid bursa, supraclavicular fossa, left subscapular space, axillary abscess and bilateral shoulder wound VAC application on 6/28. Tissue has been sent to surgical pathology ( pending)   Recommendations Continue cefazolin, dosing per pharmacy Will consider adding Rifampin once better source control given hardware in lumbar spine  Follow-up surgical path and further surgical plans Monitor CBC and  BMP Will follow intermittently  Rest of the management as per the primary team. Thank you for the consult. Please page with pertinent questions or concerns.  ______________________________________________________________________ Subjective patient seen and examined at the bedside. He is working with PT. Denies any complaints.  Denies fevers, chills and sweats Denies nausea, vomiting abdominal pain and diarrhea   Vitals BP (!) 97/57 (BP Location: Right Arm)   Pulse 77   Temp (!) 97.5 F (36.4 C)   Resp 16   Ht  (1.778 m)   Wt 97.3 kg   SpO2 100%   BMI 30.78 kg/m     Physical Exam Not in acute distress, working with PT Has bilateral wound vac in both shoulders   Pertinent Microbiology Results for orders placed or performed during the hospital encounter of 01/31/21  Urine culture     Status: Abnormal   Collection Time: 01/31/21  8:51 AM   Specimen: In/Out Cath Urine  Result Value Ref Range Status   Specimen Description   Final    IN/OUT CATH URINE Performed at [facility], [address] [place], [place] [address]    Special Requests   Final    NONE Performed at [facility], [address] [place], [place] [address]    Culture >=100,000 COLONIES/mL STAPHYLOCOCCUS AUREUS (A)  Final   Report Status 02/02/2021 FINAL  Final   Organism ID, Bacteria STAPHYLOCOCCUS AUREUS (A)  Final      Susceptibility   Staphylococcus aureus - MIC*    CIPROFLOXACIN >=8 RESISTANT Resistant     GENTAMICIN <=0.5 SENSITIVE Sensitive     NITROFURANTOIN <=16  SENSITIVE Sensitive     OXACILLIN 0.5 SENSITIVE Sensitive     TETRACYCLINE <=1 SENSITIVE Sensitive     VANCOMYCIN 1 SENSITIVE Sensitive     TRIMETH/SULFA <=10 SENSITIVE Sensitive     CLINDAMYCIN <=0.25 SENSITIVE Sensitive     RIFAMPIN <=0.5 SENSITIVE Sensitive     Inducible Clindamycin NEGATIVE Sensitive     * >=100,000 COLONIES/mL STAPHYLOCOCCUS AUREUS  Resp Panel by RT-PCR (Flu A&B,  Covid) Nasopharyngeal Swab     Status: Abnormal   Collection Time: 01/31/21 10:59 AM   Specimen: Nasopharyngeal Swab; Nasopharyngeal(NP) swabs in vial transport medium  Result Value Ref Range Status   SARS Coronavirus 2 by RT PCR POSITIVE (A) NEGATIVE Final    Comment: RESULT CALLED TO, READ BACK BY AND VERIFIED WITH: GARRISON,G. RN AT 1206 01/31/21 MULLINS,T (NOTE) SARS-CoV-2 target nucleic acids are DETECTED.  The SARS-CoV-2 RNA is generally detectable in upper respiratory specimens during the acute phase of infection. Positive results are indicative of the presence of the identified virus, but do not rule out bacterial infection or co-infection with other pathogens not detected by the test. Clinical correlation with patient history and other diagnostic information is necessary to determine patient infection status. The expected result is Negative.  Fact Sheet for Patients: BloggerCourse.comhttps://www.fda.gov/media/152166/download  Fact Sheet for Healthcare Providers: SeriousBroker.ithttps://www.fda.gov/media/152162/download  This test is not yet approved or cleared by the [place] FDA and  has been authorized for detection and/or diagnosis of SARS-CoV-2 by FDA under an Emergency Use Authorization (EUA).  This EUA will remain in effect (meaning this tes t can be used) for the duration of  the COVID-19 declaration under Section 564(b)(1) of the Act, 21 U.S.C. section 360bbb-3(b)(1), unless the authorization is terminated or revoked sooner.     Influenza A by PCR NEGATIVE NEGATIVE Final   Influenza B by PCR NEGATIVE NEGATIVE Final    Comment: (NOTE) The Xpert Xpress SARS-CoV-2/FLU/RSV plus assay is intended as an aid in the diagnosis of influenza from Nasopharyngeal swab specimens and should not be used as a sole basis for treatment. Nasal washings and aspirates are unacceptable for Xpert Xpress SARS-CoV-2/FLU/RSV testing.  Fact Sheet for Patients: BloggerCourse.comhttps://www.fda.gov/media/152166/download  Fact  Sheet for Healthcare Providers: SeriousBroker.ithttps://www.fda.gov/media/152162/download  This test is not yet approved or cleared by the [place] FDA and has been authorized for detection and/or diagnosis of SARS-CoV-2 by FDA under an Emergency Use Authorization (EUA). This EUA will remain in effect (meaning this test can be used) for the duration of the COVID-19 declaration under Section 564(b)(1) of the Act, 21 U.S.C. section 360bbb-3(b)(1), unless the authorization is terminated or revoked.  Performed at [facility], [address] [place], [place] [address]   Blood Culture (routine x 2)     Status: Abnormal   Collection Time: 01/31/21 10:59 AM   Specimen: BLOOD  Result Value Ref Range Status   Specimen Description   Final    BLOOD LEFT ANTECUBITAL Performed at [facility], [address] [place], [place] [address]    Special Requests   Final    BOTTLES DRAWN AEROBIC AND ANAEROBIC Blood Culture adequate volume Performed at [facility], [address] [place], [place] [address]    Culture  Setup Time   Final    GRAM POSITIVE COCCI IN CLUSTERS IN BOTH AEROBIC AND ANAEROBIC BOTTLES CRITICAL RESULT CALLED TO, READ BACK BY AND VERIFIED WITH: PHARMD MICHELLE L. [phone] FCP Performed at [facility], [address]  [place], [place] [address]    Culture STAPHYLOCOCCUS AUREUS (A)  Final   Report Status 02/04/2021 FINAL  Final   Organism ID, Bacteria STAPHYLOCOCCUS AUREUS  Final      Susceptibility   Staphylococcus aureus - MIC*    CIPROFLOXACIN >=8 RESISTANT Resistant     ERYTHROMYCIN >=8 RESISTANT Resistant     GENTAMICIN <=0.5 SENSITIVE Sensitive     OXACILLIN 0.5 SENSITIVE Sensitive     TETRACYCLINE <=1 SENSITIVE Sensitive     VANCOMYCIN <=0.5 SENSITIVE Sensitive     TRIMETH/SULFA <=10 SENSITIVE Sensitive     CLINDAMYCIN <=0.25 SENSITIVE Sensitive     RIFAMPIN <=0.5 SENSITIVE Sensitive     Inducible  Clindamycin NEGATIVE Sensitive     * STAPHYLOCOCCUS AUREUS  Blood Culture ID Panel (Reflexed)     Status: Abnormal   Collection Time: 01/31/21 10:59 AM  Result Value Ref Range Status   Enterococcus faecalis NOT DETECTED NOT DETECTED Final   Enterococcus Faecium NOT DETECTED NOT DETECTED Final   Listeria monocytogenes NOT DETECTED NOT DETECTED Final   Staphylococcus species DETECTED (A) NOT DETECTED Final    Comment: CRITICAL RESULT CALLED TO, READ BACK BY AND VERIFIED WITH: PHARMD MICHELLE L. [phone] FCP    Staphylococcus aureus (BCID) DETECTED (A) NOT DETECTED Final    Comment: CRITICAL RESULT CALLED TO, READ BACK BY AND VERIFIED WITH: PHARMD MICHELLE L. [phone] FCP    Staphylococcus epidermidis NOT DETECTED NOT DETECTED Final   Staphylococcus lugdunensis NOT DETECTED NOT DETECTED Final   Streptococcus species NOT DETECTED NOT DETECTED Final   Streptococcus agalactiae NOT DETECTED NOT DETECTED Final   Streptococcus pneumoniae NOT DETECTED NOT DETECTED Final   Streptococcus pyogenes NOT DETECTED NOT DETECTED Final   A.calcoaceticus-baumannii NOT DETECTED NOT DETECTED Final   Bacteroides fragilis NOT DETECTED NOT DETECTED Final   Enterobacterales NOT DETECTED NOT DETECTED Final   Enterobacter cloacae complex NOT DETECTED NOT DETECTED Final   Escherichia coli NOT DETECTED NOT DETECTED Final   Klebsiella aerogenes NOT DETECTED NOT DETECTED Final   Klebsiella oxytoca NOT DETECTED NOT DETECTED Final   Klebsiella pneumoniae NOT DETECTED NOT DETECTED Final   Proteus species NOT DETECTED NOT DETECTED Final   Salmonella species NOT DETECTED NOT DETECTED Final   Serratia marcescens NOT DETECTED NOT DETECTED Final   Haemophilus influenzae NOT DETECTED NOT DETECTED Final   Neisseria meningitidis NOT DETECTED NOT DETECTED Final   Pseudomonas aeruginosa NOT DETECTED NOT DETECTED Final   Stenotrophomonas maltophilia NOT DETECTED NOT DETECTED Final   Candida albicans NOT DETECTED NOT  DETECTED Final   Candida auris NOT DETECTED NOT DETECTED Final   Candida glabrata NOT DETECTED NOT DETECTED Final   Candida krusei NOT DETECTED NOT DETECTED Final   Candida parapsilosis NOT DETECTED NOT DETECTED Final   Candida tropicalis NOT DETECTED NOT DETECTED Final   Cryptococcus neoformans/gattii NOT DETECTED NOT DETECTED Final   Meth resistant mecA/C and MREJ NOT DETECTED NOT DETECTED Final    Comment: Performed at [facility], [address], [place], [place] [address]  Blood Culture (routine x 2)     Status: Abnormal   Collection Time: 01/31/21 11:15 AM   Specimen: BLOOD  Result Value Ref Range Status   Specimen Description   Final    BLOOD RIGHT ANTECUBITAL Performed at [facility], [address], [place], [place] [address]    Special Requests   Final    BOTTLES DRAWN AEROBIC AND ANAEROBIC Blood Culture adequate volume Performed at [facility], [address] [place], [place] [address]    Culture  Setup Time   Final    GRAM POSITIVE COCCI IN CLUSTERS IN BOTH AEROBIC AND ANAEROBIC BOTTLES CRITICAL VALUE NOTED.  VALUE IS CONSISTENT WITH PREVIOUSLY REPORTED AND CALLED VALUE.    Culture (A)  Final    STAPHYLOCOCCUS AUREUS SUSCEPTIBILITIES PERFORMED ON PREVIOUS CULTURE WITHIN THE LAST 5 DAYS. Performed at [facility], [address], [place], [place] [address]    Report Status 02/03/2021 FINAL  Final  MRSA PCR Screening     Status: None   Collection Time: 01/31/21  2:28 PM   Specimen: Nasal Mucosa; Nasopharyngeal  Result Value Ref Range Status   MRSA by PCR NEGATIVE NEGATIVE Final    Comment:        The GeneXpert MRSA Assay (FDA approved for NASAL specimens only), is one component of a comprehensive MRSA colonization surveillance program. It is not intended to diagnose MRSA infection nor to guide or monitor treatment for MRSA infections. Performed at [facility], [address],  [place], [place] [address]   Culture, blood (routine x 2)     Status: None   Collection Time: 02/01/21  2:20 PM   Specimen: BLOOD  Result Value Ref Range Status   Specimen Description   Final    BLOOD RIGHT ANTECUBITAL Performed at [facility], [address], [place], [place] [address]    Special Requests   Final    BOTTLES DRAWN AEROBIC AND ANAEROBIC Blood Culture adequate volume Performed at [facility], [address], [place], [place] [address]    Culture   Final    NO GROWTH 5 DAYS Performed at [facility], [address], [place], [place] [address]    Report Status 02/06/2021 FINAL  Final  Culture, blood (routine x 2)     Status: None   Collection Time: 02/01/21  2:20 PM   Specimen: BLOOD RIGHT HAND  Result Value Ref Range Status   Specimen Description   Final    BLOOD RIGHT HAND Performed at [facility], [address] [place], [place] [address]    Special Requests   Final    BOTTLES DRAWN AEROBIC AND ANAEROBIC Blood Culture adequate volume Performed at [facility], [address] [place], [place] [address]    Culture   Final    NO GROWTH 5 DAYS Performed at [facility], [address] [place], [place] [address]    Report Status 02/06/2021 FINAL  Final  Aerobic/Anaerobic Culture w Gram Stain (surgical/deep wound)     Status: None   Collection Time: 02/02/21  4:12 PM   Specimen: Abscess  Result Value Ref Range Status   Specimen Description   Final    ABSCESS SUBSCAPULAR Performed at [facility], [address] [place], [place] [address]    Special Requests   Final    NONE Performed at [facility], [address], [place], [place] [address]    Gram Stain   Final    ABUNDANT WBC PRESENT, PREDOMINANTLY PMN ABUNDANT GRAM POSITIVE COCCI IN CLUSTERS IN TETRADS    Culture   Final    ABUNDANT STAPHYLOCOCCUS AUREUS NO ANAEROBES  ISOLATED Performed at [facility], [address] [place], [place] [address]    Report Status 02/07/2021 FINAL  Final   Organism ID, Bacteria STAPHYLOCOCCUS AUREUS  Final  Susceptibility   Staphylococcus aureus - MIC*    CIPROFLOXACIN >=8 RESISTANT Resistant     ERYTHROMYCIN >=8 RESISTANT Resistant     GENTAMICIN <=0.5 SENSITIVE Sensitive     OXACILLIN 0.5 SENSITIVE Sensitive     TETRACYCLINE <=1 SENSITIVE Sensitive     VANCOMYCIN <=0.5 SENSITIVE Sensitive     TRIMETH/SULFA <=10 SENSITIVE Sensitive     CLINDAMYCIN <=0.25 SENSITIVE Sensitive     RIFAMPIN <=0.5 SENSITIVE Sensitive     Inducible Clindamycin NEGATIVE Sensitive     * ABUNDANT STAPHYLOCOCCUS AUREUS  Fungus Culture With Stain     Status: None (Preliminary result)   Collection Time: 02/08/21  6:48 PM   Specimen: Synovial, Left Shoulder; Body Fluid  Result Value Ref Range Status   Fungus Stain Final report  Final    Comment: (NOTE) Performed At: [facility] [address] [place], [place] [id number] Jolene Schimke MD JI:[id number]    Fungus (Mycology) Culture PENDING  Incomplete   Fungal Source SHOULDER  Final    Comment: LEFT Performed at [facility], [address] [place], [place] [address]   Aerobic/Anaerobic Culture w Gram Stain (surgical/deep wound)     Status: None   Collection Time: 02/08/21  6:48 PM   Specimen: Synovial, Left Shoulder; Body Fluid  Result Value Ref Range Status   Specimen Description   Final    SHOULDER LEFT Performed at [facility], [address] [place], [place] [address]    Special Requests   Final    NONE Performed at [facility], [address], [place], [place] [address]    Gram Stain   Final    ABUNDANT WBC PRESENT,BOTH PMN AND MONONUCLEAR RARE GRAM POSITIVE COCCI IN PAIRS    Culture   Final    RARE STAPHYLOCOCCUS AUREUS NO ANAEROBES ISOLATED Performed at [facility], [address] [place], [place] [address]    Report Status 02/14/2021 FINAL  Final   Organism ID, Bacteria STAPHYLOCOCCUS AUREUS  Final      Susceptibility   Staphylococcus aureus - MIC*    CIPROFLOXACIN >=8 RESISTANT Resistant     ERYTHROMYCIN >=8 RESISTANT Resistant     GENTAMICIN <=0.5 SENSITIVE Sensitive     OXACILLIN 0.5 SENSITIVE Sensitive     TETRACYCLINE <=1 SENSITIVE Sensitive     VANCOMYCIN 1 SENSITIVE Sensitive     TRIMETH/SULFA <=10 SENSITIVE Sensitive     CLINDAMYCIN <=0.25 SENSITIVE Sensitive     RIFAMPIN <=0.5 SENSITIVE Sensitive     Inducible Clindamycin NEGATIVE Sensitive     * RARE STAPHYLOCOCCUS AUREUS  Fungus Culture Result     Status: None   Collection Time: 02/08/21  6:48 PM  Result Value Ref Range Status   Result 1 Comment  Final    Comment: (NOTE) KOH/Calcofluor preparation:  no fungus observed. Performed At: [facility] [address] [place], [place] [id number] Jolene Schimke MD QP:[id number]   Fungus Culture With Stain     Status: None (Preliminary result)   Collection Time: 02/08/21  6:52 PM   Specimen: Joint, Other; Body Fluid  Result Value Ref Range Status   Fungus Stain Final report  Final    Comment: (NOTE) Performed At: [facility] [address] [place], [place] [id number] Jolene Schimke MD PJ:[id number]    Fungus (Mycology) Culture PENDING  Incomplete   Fungal Source SHOULDER  Final    Comment: LEFT BONE CHIPS Performed at [facility], [address] [place], [place] [address]   Aerobic/Anaerobic Culture w Gram Stain (surgical/deep wound)     Status: None   Collection Time: 02/08/21  6:52 PM   Specimen: Joint, Other; Body Fluid  Result Value Ref Range Status   Specimen Description   Final    SHOULDER LEFT BONE CHIPS Performed at [facility], [address], [place], [place] [address]    Special Requests   Final    NONE Performed at [facility], [address]  [place], [place] [address]    Gram Stain NO WBC SEEN NO ORGANISMS SEEN   Final   Culture   Final    RARE STAPHYLOCOCCUS AUREUS NO ANAEROBES ISOLATED Performed at [facility], [address], [place], [place] [address]    Report Status 02/14/2021 FINAL  Final   Organism ID, Bacteria STAPHYLOCOCCUS AUREUS  Final      Susceptibility   Staphylococcus aureus - MIC*    CIPROFLOXACIN >=8 RESISTANT Resistant     ERYTHROMYCIN >=8 RESISTANT Resistant     GENTAMICIN <=0.5 SENSITIVE Sensitive     OXACILLIN 0.5 SENSITIVE Sensitive     TETRACYCLINE <=1 SENSITIVE Sensitive     VANCOMYCIN 1 SENSITIVE Sensitive     TRIMETH/SULFA <=10 SENSITIVE Sensitive     CLINDAMYCIN <=0.25 SENSITIVE Sensitive     RIFAMPIN <=0.5 SENSITIVE Sensitive     Inducible Clindamycin NEGATIVE Sensitive     * RARE STAPHYLOCOCCUS AUREUS  Fungus Culture Result     Status: None   Collection Time: 02/08/21  6:52 PM  Result Value Ref Range Status   Result 1 Comment  Final    Comment: (NOTE) KOH/Calcofluor preparation:  no fungus observed. Performed At: [facility] [address] [place], [place] [id number] Jolene Schimke MD LK:[id number]   Aerobic/Anaerobic Culture w Gram Stain (surgical/deep wound)     Status: None   Collection Time: 02/10/21 10:22 AM   Specimen: Wound  Result Value Ref Range Status   Specimen Description   Final    WOUND CHEST Performed at [facility], [address] [place], [place] [address]    Special Requests   Final    NONE Performed at [facility], [address], [place], [place] [address]    Gram Stain   Final    ABUNDANT WBC PRESENT,BOTH PMN AND MONONUCLEAR MODERATE GRAM POSITIVE COCCI    Culture   Final    ABUNDANT STAPHYLOCOCCUS AUREUS NO ANAEROBES ISOLATED Performed at [facility], [address] [place], [place] [address]    Report Status 02/15/2021 FINAL  Final   Organism ID, Bacteria STAPHYLOCOCCUS AUREUS  Final       Susceptibility   Staphylococcus aureus - MIC*    CIPROFLOXACIN >=8 RESISTANT Resistant     ERYTHROMYCIN >=8 RESISTANT Resistant     GENTAMICIN <=0.5 SENSITIVE Sensitive     OXACILLIN 0.5 SENSITIVE Sensitive     TETRACYCLINE <=1 SENSITIVE Sensitive     VANCOMYCIN 1 SENSITIVE Sensitive     TRIMETH/SULFA <=10 SENSITIVE Sensitive     CLINDAMYCIN <=0.25 SENSITIVE Sensitive     RIFAMPIN <=0.5 SENSITIVE Sensitive     Inducible Clindamycin NEGATIVE Sensitive     * ABUNDANT STAPHYLOCOCCUS AUREUS  Culture, blood (single)     Status: None   Collection Time: 02/13/21  5:51 AM   Specimen: BLOOD  Result Value Ref Range Status   Specimen Description   Final    BLOOD RIGHT ANTECUBITAL  Performed at [facility], [address], [place], [place] [address]    Special Requests   Final    BOTTLES DRAWN AEROBIC ONLY Blood Culture adequate volume Performed at [facility] [address] [place], [place] [address]    Culture   Final    NO GROWTH 5 DAYS Performed at [facility], [address], [place], [place] [address]    Report Status 02/18/2021 FINAL  Final  Culture, blood (single)     Status: None   Collection Time: 02/14/21  2:12 AM   Specimen: BLOOD  Result Value Ref Range Status   Specimen Description   Final    BLOOD RIGHT ANTECUBITAL Performed at [facility] [place] [facility], [address] [place], [place] [address]    Special Requests   Final    BOTTLES DRAWN AEROBIC AND ANAEROBIC Blood Culture adequate volume Performed at [facility], [address], [place], [place] [address]    Culture   Final    NO GROWTH 5 DAYS Performed at [facility], [address] [place], [place] [address]    Report Status 02/19/2021 FINAL  Final  Aerobic/Anaerobic Culture w Gram Stain (surgical/deep wound)     Status: None   Collection Time: 02/15/21  5:02 PM   Specimen: Abscess  Result Value Ref Range Status   Specimen  Description   Final    ABSCESS LEFT SHOULDER Performed at [facility], [address] [place], [place] [address]    Special Requests   Final    NONE Performed at [facility], [address] [place], [place] [address]    Gram Stain NO WBC SEEN NO ORGANISMS SEEN   Final   Culture   Final    FEW STAPHYLOCOCCUS AUREUS SUSCEPTIBILITIES PERFORMED ON PREVIOUS CULTURE WITHIN THE LAST 5 DAYS. NO ANAEROBES ISOLATED Performed at [facility], [address] [place], [place] [address]    Report Status 02/21/2021 FINAL  Final  Aerobic/Anaerobic Culture w Gram Stain (surgical/deep wound)     Status: None   Collection Time: 02/15/21  5:06 PM   Specimen: Abscess  Result Value Ref Range Status   Specimen Description   Final    ABSCESS POST US GUIDED LEFT SUPRAVICULAR ABSCESS DRAIN PLCEMENT Performed at [facility], [address] [place], [place] [address]    Special Requests   Final    NONE Performed at [facility], [address], [place], [place] [address]    Gram Stain   Final    FEW WBC PRESENT,BOTH PMN AND MONONUCLEAR FEW GRAM POSITIVE COCCI IN PAIRS    Culture   Final    ABUNDANT STAPHYLOCOCCUS AUREUS NO ANAEROBES ISOLATED Performed at [facility], [address] [place], [place] [address]    Report Status 02/21/2021 FINAL  Final   Organism ID, Bacteria STAPHYLOCOCCUS AUREUS  Final      Susceptibility   Staphylococcus aureus - MIC*    CIPROFLOXACIN >=8 RESISTANT Resistant     ERYTHROMYCIN >=8 RESISTANT Resistant     GENTAMICIN <=0.5 SENSITIVE Sensitive     OXACILLIN 0.5 SENSITIVE Sensitive     TETRACYCLINE <=1 SENSITIVE Sensitive     VANCOMYCIN <=0.5 SENSITIVE Sensitive     TRIMETH/SULFA <=10 SENSITIVE Sensitive     CLINDAMYCIN <=0.25 SENSITIVE Sensitive     RIFAMPIN <=0.5 SENSITIVE Sensitive     Inducible Clindamycin NEGATIVE Sensitive     *  ABUNDANT STAPHYLOCOCCUS AUREUS    Pertinent Lab. CBC Latest Ref Rng & Units 02/21/2021 02/19/2021 02/18/2021  WBC 4.0 - 10.5 K/uL 8.8 8.7 9.7  Hemoglobin 13.0 - 17.0 g/dL 7.6(L) 7.7(L) 7.4(L)  Hematocrit 39.0 - 52.0 % 24.2(L) 23.5(L) 22.9(L)  Platelets 150 - 400 K/uL 630(H) 630(H) 606(H)   CMP Latest Ref Rng & Units 02/21/2021 02/20/2021 02/19/2021  Glucose 70 - 99 mg/dL 660(Y) 301(S) 010(X)  BUN 6 - 20 mg/dL 10 7 6   Creatinine 0.61 - 1.24 mg/dL ) 3.23(F) 5.73(U)  Sodium 135 - 145 mmol/L 131(L) 134(L) 131(L)  Potassium 3.5 - 5.1 mmol/L 4.7 4.3 4.3  Chloride 98 - 111 mmol/L 95(L) 95(L) 95(L)  CO2 22 - 32 mmol/L 27 30 28   Calcium 8.9 - 10.3 mg/dL 8.0(L) 8.3(L) 8.0(L)  Total Protein 6.5 - 8.1 g/dL - - -  Total Bilirubin 0.3 - 1.2 mg/dL - - -  Alkaline Phos 38 - 126 U/L - - -  AST 15 - 41 U/L - - -  ALT 0 - 44 U/L - - -    Pertinent Imaging today Plain films and CT images have been personally visualized and interpreted; radiology reports have been reviewed. Decision making incorporated into the Impression / Recommendations.  I have spent more than 35 minutes for this patient encounter including review of prior medical records, coordination of care  with greater than 50% of time being face to face/counseling and discussing diagnostics/treatment plan with the patient/family.  Electronically signed by:   2.02(R, MD Infectious Disease Physician [facility] for Infectious Disease Pager: [phone]

## 2021-02-21 NOTE — Progress Notes (Signed)
Discussed new wound management dressing change schedule with patient for today. Pt is not agreeable to changing dressings today. Education provided. Pt stated he had been seen by the [facility].

## 2021-02-21 NOTE — TOC Progression Note (Signed)
Transition of Care [facility]) - Progression Note    Patient Details  Name: John Ware MRN: [id number] Date of Birth: [birth year]  [facility] of [facility]) CM/SW Contact  Signora Zucco, Olegario Messier, RN Phone Number: 02/21/2021, 1:00 PM  Clinical Narrative:  Continue to follow for d/c plans-Osteomyelitis. Bilateral shoulder abscess-debrided 6/16, 6/18.R/L back drains;L CW dsg/L shoulder wound vac.CIR currently not appropriate. PCA pump.patient plans to d/c home. Will check into [facility] agency to follow.Continue to follow for d/c plans.    Barriers to Discharge: Continued Medical Work up  Expected Discharge Plan and Services   Discharge Planning Services: CM Consult   Living arrangements for the past 2 months: Single Family Home                                       Social Determinants of Health (SDOH) Interventions    Readmission Risk Interventions Readmission Risk Prevention Plan 02/16/2021  Transportation Screening Complete  PCP or Specialist Appt within 3-5 Days Complete  HRI or Home Care Consult Complete  Social Work Consult for Recovery Care Planning/Counseling Complete  Palliative Care Screening Complete  Medication Review Oceanographer) Complete  Some recent data might be hidden

## 2021-02-21 NOTE — Progress Notes (Signed)
Subjective:   Patient is alert, oriented, sitting up in chair. Patient states he was able to get up out of the chair without pain in hips, knees, ankles or feet.   Objective:  PE: VITALS:   Vitals:   02/21/21 0400 02/21/21 0750 02/21/21 0924 02/21/21 1209  BP:  102/71  (!) 97/57  Pulse:  85  77  Resp: 19 16 16 16   Temp:  98 F (36.7 C)  (!) 97.5 F (36.4 C)  TempSrc:      SpO2: 99% 100% 100% 100%  Weight:      Height:        UE: 0-20 degrees of active forward flexion of right shoulder, 0-10 deg left shoulder. No erythema. Wound vacs in place with good seal, 200 cc's in R shoulder canister, 50 cc's in L shoulder canister. Distal sensation and capillary refill intact.   LABS  Results for orders placed or performed during the hospital encounter of 01/31/21 (from the past 24 hour(s))  Glucose, capillary     Status: Abnormal   Collection Time: 02/20/21  7:09 PM  Result Value Ref Range   Glucose-Capillary 112 (H) 70 - 99 mg/dL  Glucose, capillary     Status: Abnormal   Collection Time: 02/20/21  8:14 PM  Result Value Ref Range   Glucose-Capillary 170 (H) 70 - 99 mg/dL  CBC with Differential/Platelet     Status: Abnormal   Collection Time: 02/21/21  5:05 AM  Result Value Ref Range   WBC 8.8 4.0 - 10.5 K/uL   RBC 2.56 (L) 4.22 - 5.81 MIL/uL   Hemoglobin 7.6 (L) 13.0 - 17.0 g/dL   HCT 02/23/21 (L) 56.3 - 89.3 %   MCV 94.5 80.0 - 100.0 fL   MCH 29.7 26.0 - 34.0 pg   MCHC 31.4 30.0 - 36.0 g/dL   RDW 73.4 28.7 - 68.1 %   Platelets 630 (H) 150 - 400 K/uL   nRBC 0.0 0.0 - 0.2 %   Neutrophils Relative % 88 %   Neutro Abs 7.8 (H) 1.7 - 7.7 K/uL   Lymphocytes Relative 8 %   Lymphs Abs 0.7 0.7 - 4.0 K/uL   Monocytes Relative 3 %   Monocytes Absolute 0.3 0.1 - 1.0 K/uL   Eosinophils Relative 0 %   Eosinophils Absolute 0.0 0.0 - 0.5 K/uL   Basophils Relative 0 %   Basophils Absolute 0.0 0.0 - 0.1 K/uL   Immature Granulocytes 1 %   Abs Immature Granulocytes 0.04 0.00 - 0.07  K/uL  Magnesium     Status: None   Collection Time: 02/21/21  5:05 AM  Result Value Ref Range   Magnesium 1.8 1.7 - 2.4 mg/dL  Basic metabolic panel     Status: Abnormal   Collection Time: 02/21/21  5:05 AM  Result Value Ref Range   Sodium 131 (L) 135 - 145 mmol/L   Potassium 4.7 3.5 - 5.1 mmol/L   Chloride 95 (L) 98 - 111 mmol/L   CO2 27 22 - 32 mmol/L   Glucose, Bld 371 (H) 70 - 99 mg/dL   BUN 10 6 - 20 mg/dL   Creatinine, Ser 02/23/21 (L) 0.61 - 1.24 mg/dL   Calcium 8.0 (L) 8.9 - 10.3 mg/dL   GFR, Estimated 2.62 >03 mL/min   Anion gap 9 5 - 15  Glucose, capillary     Status: Abnormal   Collection Time: 02/21/21  7:18 AM  Result Value Ref Range   Glucose-Capillary  323 (H) 70 - 99 mg/dL  Vitamin J03     Status: Abnormal   Collection Time: 02/21/21  8:29 AM  Result Value Ref Range   Vitamin B-12 1,975 (H) 180 - 914 pg/mL  Folate     Status: None   Collection Time: 02/21/21  8:29 AM  Result Value Ref Range   Folate 8.8 >5.9 ng/mL  Iron and TIBC     Status: Abnormal   Collection Time: 02/21/21  8:29 AM  Result Value Ref Range   Iron 23 (L) 45 - 182 ug/dL   TIBC 009 (L) 233 - 007 ug/dL   Saturation Ratios 14 (L) 17.9 - 39.5 %   UIBC 145 ug/dL  Ferritin     Status: Abnormal   Collection Time: 02/21/21  8:29 AM  Result Value Ref Range   Ferritin 569 (H) 24 - 336 ng/mL  Reticulocytes     Status: Abnormal   Collection Time: 02/21/21  8:29 AM  Result Value Ref Range   Retic Ct Pct 2.1 0.4 - 3.1 %   RBC. 2.78 (L) 4.22 - 5.81 MIL/uL   Retic Count, Absolute 58.7 19.0 - 186.0 K/uL   Immature Retic Fract 20.2 (H) 2.3 - 15.9 %  Glucose, capillary     Status: Abnormal   Collection Time: 02/21/21 11:16 AM  Result Value Ref Range   Glucose-Capillary 307 (H) 70 - 99 mg/dL    No results found.  Assessment/Plan: Bilateral shoulder septic arthritis with bilateral psoas abscesses - L shoulder irrigation and debridement 6/16 - R shoulder irrigation and debridement with distal clavicle  resection 6/17 - s/p drain placement 6/24 with IR for perispinal and psoas abscesses - s/p bilateral shoulder irrigation and debridement with wound vac placement bilaterally 6/29 - plan is to keep wound vacs in place with repeat imaging this weekend/early next week depending on clinical progress with possible repeat I&D next week   John Ware 02/21/2021,

## 2021-02-21 NOTE — Progress Notes (Signed)
1 Day Post-Op  Subjective: CC: Very pleasant and thankful this am. Wife at bedside. Pain mainly over his left chest wall, b/l shoulder and back where drains are in place. Well controlled on PCA. Left chest wall dressing already changed this am. He was agreeable to let me take down dressing to look at the bases of wound beds.   Objective: Vital signs in last 24 hours: Temp:  [97.5 F (36.4 C)-99.9 F (37.7 C)] 97.5 F (36.4 C) (06/29 1209) Pulse Rate:  [68-91] 77 (06/29 1209) Resp:  [11-20] 16 (06/29 1209) BP: (97-138)/(57-94) 97/57 (06/29 1209) SpO2:  [95 %-100 %] 100 % (06/29 1209) Weight:  [97.3 kg] 97.3 kg (06/28 1419) Last BM Date: 02/19/21  Intake/Output from previous day: 06/28 0701 - 06/29 0700 In: 1608.6 [P.O.:440; I.V.:603.6; IV Piggyback:550] Out: 290 [Drains:90; Blood:200] Intake/Output this shift: Total I/O In: -  Out: 425 [Urine:425]  PE: Left chest wall wound: Base of the wound with healthy granulation tissue. There is no significant drainage. No overlying cellulitis Back: R JP in place w/ scant yellow fluid. L JP in place w/ scant yellow fluid B/l shoulders: wound vacs in place  Lab Results:  Recent Labs    02/19/21 0431 02/21/21 0505  WBC 8.7 8.8  HGB 7.7* 7.6*  HCT 23.5* 24.2*  PLT 630* 630*   BMET Recent Labs    02/20/21 0815 02/21/21 0505  NA 134* 131*  K 4.3 4.7  CL 95* 95*  CO2 30 27  GLUCOSE 119* 371*  BUN 7 10  CREATININE 0.50* 0.56*  CALCIUM 8.3* 8.0*   PT/INR No results for input(s): LABPROT, INR in the last 72 hours. CMP     Component Value Date/Time   NA 131 (L) 02/21/2021 0505   NA 137 10/09/2020 1155   K 4.7 02/21/2021 0505   CL 95 (L) 02/21/2021 0505   CO2 27 02/21/2021 0505   GLUCOSE 371 (H) 02/21/2021 0505   BUN 10 02/21/2021 0505   BUN 11 10/09/2020 1155   CREATININE 0.56 (L) 02/21/2021 0505   CALCIUM 8.0 (L) 02/21/2021 0505   PROT 5.2 (L) 02/14/2021 0212   PROT 7.6 10/09/2020 1155   ALBUMIN 1.8 (L)  02/14/2021 0212   ALBUMIN 4.7 10/09/2020 1155   AST 53 (H) 02/14/2021 0212   ALT 30 02/14/2021 0212   ALKPHOS 53 02/14/2021 0212   BILITOT 0.7 02/14/2021 0212   BILITOT 0.8 10/09/2020 1155   GFRNONAA >60 02/21/2021 0505   GFRAA 113 10/09/2020 1155   Lipase  No results found for: LIPASE     Studies/Results: No results found.  Anti-infectives: Anti-infectives (From admission, onward)    Start     Dose/Rate Route Frequency Ordered Stop   02/20/21 1630  vancomycin (VANCOCIN) powder  Status:  Discontinued          As needed 02/20/21 1649 02/20/21 1943   02/10/21 0727  ceFAZolin (ANCEF) 2-4 GM/100ML-% IVPB       Note to Pharmacy: Lyda Kalata   : cabinet override      02/10/21 0727 02/10/21 1856   02/01/21 0600  ceFAZolin (ANCEF) IVPB 2g/100 mL premix        2 g 200 mL/hr over 30 Minutes Intravenous Every 8 hours 02/01/21 0414     01/31/21 1800  cefTRIAXone (ROCEPHIN) 1 g in sodium chloride 0.9 % 100 mL IVPB  Status:  Discontinued        1 g 200 mL/hr over 30 Minutes Intravenous Every 24  hours 01/31/21 1453 02/01/21 0425   01/31/21 1115  vancomycin (VANCOREADY) IVPB 2000 mg/400 mL        2,000 mg 200 mL/hr over 120 Minutes Intravenous  Once 01/31/21 1105 01/31/21 1320   01/31/21 1045  ceFEPIme (MAXIPIME) 2 g in sodium chloride 0.9 % 100 mL IVPB        2 g 200 mL/hr over 30 Minutes Intravenous  Once 01/31/21 1035 01/31/21 1129   01/31/21 1045  metroNIDAZOLE (FLAGYL) IVPB 500 mg        500 mg 100 mL/hr over 60 Minutes Intravenous  Once 01/31/21 1035 01/31/21 1208   01/31/21 1045  vancomycin (VANCOCIN) IVPB 1000 mg/200 mL premix  Status:  Discontinued        1,000 mg 200 mL/hr over 60 Minutes Intravenous  Once 01/31/21 1035 01/31/21 1105        Assessment/Plan Complex left lateral chest wall and axillary abscess POD 11 S/p Incision and drainage of multiple left lateral chest wall abscesses along with excisional debridement of left lateral chest wall skin, soft tissue,  muscle, and fascia and axilla 6/18 Dr. Andrey Campanile POD 1 s/p exploration of the left axilla - Dr. Daphine Deutscher - 6/28 - Cx with abundant staphylococcus aureus - ID following and managing antibiotics - PCA for pain - Path pending - Continue BID dressing changes   Bilateral shoulder abscess, s/p debridement 6/16, 6/18 and 6/28 - Per Dr. 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 Septic arthitis right T3-4 and T4-5 facets on MRI Bilateral psoas abscess, R>L (3.7cm) - s/p drain placement x 2 on 6/23 by IR HTN DM Chronic back pain   LOS: 21 days    [name] , [facility] Surgery 02/21/2021, 12:50 PM Please see Amion for pager number during day hours 7:00am-4:30pm

## 2021-02-22 ENCOUNTER — Inpatient Hospital Stay (HOSPITAL_COMMUNITY): Payer: Commercial Managed Care - PPO

## 2021-02-22 DIAGNOSIS — R6520 Severe sepsis without septic shock: Secondary | ICD-10-CM | POA: Diagnosis not present

## 2021-02-22 DIAGNOSIS — A419 Sepsis, unspecified organism: Secondary | ICD-10-CM | POA: Diagnosis not present

## 2021-02-22 LAB — BASIC METABOLIC PANEL
Anion gap: 4 — ABNORMAL LOW (ref 5–15)
BUN: 9 mg/dL (ref 6–20)
CO2: 31 mmol/L (ref 22–32)
Calcium: 7.9 mg/dL — ABNORMAL LOW (ref 8.9–10.3)
Chloride: 102 mmol/L (ref 98–111)
Creatinine, Ser: 0.4 mg/dL — ABNORMAL LOW (ref 0.61–1.24)
GFR, Estimated: >60 mL/min (ref >60)
Glucose, Bld: 90 mg/dL (ref 70–99)
Potassium: 3.7 mmol/L (ref 3.5–5.1)
Sodium: 137 mmol/L (ref 135–145)

## 2021-02-22 LAB — GLUCOSE, CAPILLARY
Glucose-Capillary: 77 mg/dL (ref 70–99)
Glucose-Capillary: 130 mg/dL — ABNORMAL HIGH (ref 70–99)
Glucose-Capillary: 52 mg/dL — ABNORMAL LOW (ref 70–99)
Glucose-Capillary: 75 mg/dL (ref 70–99)
Glucose-Capillary: 98 mg/dL (ref 70–99)

## 2021-02-22 LAB — CBC
HCT: 19.8 % — ABNORMAL LOW (ref 39.0–52.0)
Hemoglobin: 6.3 g/dL — CL (ref 13.0–17.0)
MCH: 29.6 pg (ref 26.0–34.0)
MCHC: 31.8 g/dL (ref 30.0–36.0)
MCV: 93 fL (ref 80.0–100.0)
Platelets: 743 10*3/uL — ABNORMAL HIGH (ref 150–400)
RBC: 2.13 MIL/uL — ABNORMAL LOW (ref 4.22–5.81)
RDW: 14.6 % (ref 11.5–15.5)
WBC: 9.3 10*3/uL (ref 4.0–10.5)
nRBC: 0 % (ref 0.0–0.2)

## 2021-02-22 LAB — SURGICAL PATHOLOGY

## 2021-02-22 LAB — PREPARE RBC (CROSSMATCH)

## 2021-02-22 MED ORDER — IOHEXOL 300 MG/ML  SOLN
100.0000 mL | Freq: Once | INTRAMUSCULAR | Status: AC | PRN
  Administered 2021-02-22: 100 mL via INTRAVENOUS

## 2021-02-22 MED ORDER — SODIUM CHLORIDE 0.9% IV SOLUTION: Freq: Once | INTRAVENOUS | Status: DC

## 2021-02-22 MED ORDER — SODIUM CHLORIDE (PF) 0.9 % IJ SOLN
INTRAMUSCULAR | Status: AC
  Filled 2021-02-22: qty 50

## 2021-02-22 NOTE — Progress Notes (Signed)
Pt not agreeable with changing of dressing and stated he wants [facility] to do it. Education given.

## 2021-02-22 NOTE — Progress Notes (Signed)
Progress Note    John Ware   YTK:[id number]  DOB: July 25, 1966  DOA: 01/31/2021     22  PCP: Sharion Balloon, FNP  CC: Weakness, ongoing pain  Hospital Course: John Ware is a 55 y.o. male with medical history significant of chronic pain, HTN, DM2.  He had at home positive COVID test on 01/12/2021.  He had no respiratory symptoms at this time but he did have fevers that was responsive to Tylenol.  Reportedly he apparently fell out of his chair and hurt his left shoulder and he was evaluated by the ED and they found no fracture and he was given a sling and told to follow-up with orthopedics.  Since that time the patient's wife reports that he has had general body aches and pain and has increased symptoms that are not responsive to his chronic pain meds.  He is also had poor appetite during this time and has been unable to walk for last few days.  Because of these constantly some symptoms he is brought to the ED for further evaluation.  In the ED is found to have a sodium of 113 and a glucose of 339 and a WBC of 20.5 as well as a lactic acid of 3.8.  He started on broad-spectrum antibiotics and given fluids.  Soon after his blood cultures became positive for 4-4 MSSA.  He was transitioned to IV Ancef.  With further work-up and confirmation he was also found to have a large fluid collection of left lateral posterior chest and back.  He did have tenderness in his back.  He underwent MRI T-spine and CT of the chest abdomen pelvis.  There is no evidence of any osteomyelitis or discitis or other underlying infection on MRI T-spine.  CT of the chest did show a large multiloculated fluid collection.  IR was consulted and he underwent an ultrasound-guided aspiration was sent for culturing.  CT was concerning for evidence of septic emboli but 2D echo did not reveal any evidence of an endocarditis.  Cardiology was consulted for TEE per ID recommendations and he underwent TEE on 02/05/2021 which found a  large tricuspid valve vegetation with mild tricuspid regurg.  Subsequently he still complained of some shoulder pain so he underwent bilateral MRIs of his shoulders and he is still having fevers.  At the left shoulder was concerned for septic arthritis and left axillary abscess.  Orthopedics was reconsulted as well as IR and the orthopedic surgeon is planning to do a washout tomorrow in edition IR is going to place a drain on the residual fluid collection that may exist after washout.  Because of the surgery ID changed the patient to nafcillin however recommending continuing cefazolin at this time. ESR and CRP and both are elevated.   Patient underwent on 02/08/21 left shoulder excisional debridement, deep abscess with arthrotomy and irrigation debridement of the joint with biceps tenotomy and excision of the necrotic rotator cuff.  Patient is not medically ready to be D/C'd to CIR.   Postoperatively he had a CT scan and repeated and showed a marked increase in size of the abscess collection at the left lateral chest wall with persistent abscess again seen in the subscapular extending cranial to the apex of the left hemithorax.  Since he is post I&D it was unclear about how much of the increase in size of the abscess was due to the postsurgical changes progression of the abscess collection.  Patient did have some small bilateral pleural  effusions and bibasilar atelectasis as well as reactive left axillary adenopathy.  Post surgery patient was given Decadron 10 mg last night.  Because of the changes on his repeat CT scan with a left chest wall abscess with gas on CT General surgery was consulted and they are planning on taking the patient back to the OR to address his right shoulder along with a large left lateral chest wall abscess and this was done 6/18.  ID recommends continuing IV cefazolin currently  6/29: Patient underwent extensive debridement of bilateral shoulder joints and a large wound VAC placement  by orthopedic on 02/20/2021. He also underwent axillary exploration and drainage of multiple abscesses involving the chest wall and back-multiple new drains were placed by general surgery. Necrotic lymph node was sent for pathology.  6/30: Hemoglobin dropped to 6.3, no obvious bleeding.  2 unit of PRBC ordered.  Remains on PCA pump-which is being managed by surgery.  Subjective. Patient is seen and examined today.  No new complaint, stating pain is 2/10 with PCA.  He was asking for something else stronger during dressing change.  We discussed about weaning from PCA pump but he wants to be restarted on home regimen first.  Assessment & Plan: Old records reviewed in assessment of this patient. Complicated course of admission with multiple abscesses requiring multiple procedures. Please see prior notes for more detail.  Severe sepsis/MSSA bacteremia/tricuspid endocarditis. Initially met severe sepsis criteria with fever, tachycardia, tachypnea, leukocytosis and lactic acidosis. Initial blood cultures with MSSA, repeat blood cultures done on 02/01/2021 remain negative. TEE done on 6/13 with large tricuspid vegetation.  Large left posterior wall and bilateral psoas muscle abscesses, bilateral septic shoulder joints, osteomyelitis and discitis.[facility] Kitchen MRI brain done on 6/12 due to his complaint of seeing floaters was negative for endophthalmitis or any other acute abnormality. 6/28: Underwent extensive debridement of bilateral shoulder joints and axillary exploration with incision and drainage of multiple other abscesses.  Pending pathology from necrotic lymph node. -Continue with Ancef -Continue pain management-sent a message to surgery regarding possible weaning off from PCA pump.  Anemia of chronic disease.  Hemoglobin dropped to 6.3 today, no obvious bleeding, may be post procedural as patient underwent extensive debridement.  Anemia panel consistent with anemia of chronic disease with mild iron  deficiency. -2 unit of PRBC -Continue p.o. iron supplement. -Monitor hemoglobin -Transfuse if below 7  DKA.  Resolved.  Type 2 diabetes mellitus.  A1c of 10.2 on 01/31/2021.  CBG within goal. -Continue Lantus to 20 units daily. -Continue with SSI along with mealtime coverage of 4 units.  Hyponatremia.  Resolved.  Corrected sodium of 135 -Continue to monitor  Ascending aorta dilatation (HCC) - TTE on 6/10 notes mild dilation of ascending aorta, 38 mm - follow TEE results as well: normal thoracic and ascending aorta noted on 02/05/21   Anxiety - Wellbutrin and Zoloft on hold for now in setting of hyponatremia   Antimicrobials: Cefepime 01/31/21 x 1 Rocephin 01/31/21 x 1 Vanc 01/31/21 x 1 Ancef 02/01/21 >> current  DVT prophylaxis: SCDs Start: 02/20/21 2001 enoxaparin (LOVENOX) injection 40 mg Start: 02/11/21 1000 SCDs Start: 02/08/21 2116   Code Status:   Code Status: Full Code Family Communication: Wife at bedside.  Disposition Plan: Status is: Inpatient  Remains inpatient appropriate because:Ongoing diagnostic testing needed not appropriate for outpatient work up, IV treatments appropriate due to intensity of illness or inability to take PO, and Inpatient level of care appropriate due to severity of illness  Dispo: The  patient is from: Home              Anticipated d/c is to:  SNF              Patient currently is not medically stable to d/c.   Difficult to place patient No  Risk of unplanned readmission score: Unplanned Admission- Pilot do not use: 25.65   Objective: Blood pressure 114/90, pulse 79, temperature (!) 97.5 F (36.4 C), temperature source Oral, resp. rate 20, height '5\' 10"'  (1.778 m), weight 97.3 kg, SpO2 100 %.  Examination:  General.  Well-developed gentleman, in no acute distress.  Multiple wound VAC and drain in place. Pulmonary.  Lungs clear bilaterally, normal respiratory effort. CV.  Regular rate and rhythm, no JVD, rub or murmur. Abdomen.  Soft,  nontender, nondistended, BS positive. CNS.  Alert and oriented x3.  No focal neurologic deficit. Extremities.  No edema, no cyanosis, pulses intact and symmetrical. Psychiatry.  Judgment and insight appears normal.   Consultants:  ID Orthopedic surgery General surgery  Procedures:  IR drainage of back fluid collection, 6/10 Extensive debridement of bilateral shoulder joint-6/28 Left axillary exploration and incision and drainage of multiple abscesses-6/28  Data Reviewed: I have personally reviewed following labs and imaging studies Results for orders placed or performed during the hospital encounter of 01/31/21 (from the past 24 hour(s))  Vitamin B12     Status: Abnormal   Collection Time: 02/21/21  8:29 AM  Result Value Ref Range   Vitamin B-12 1,975 (H) 180 - 914 pg/mL  Folate     Status: None   Collection Time: 02/21/21  8:29 AM  Result Value Ref Range   Folate 8.8 >5.9 ng/mL  Iron and TIBC     Status: Abnormal   Collection Time: 02/21/21  8:29 AM  Result Value Ref Range   Iron 23 (L) 45 - 182 ug/dL   TIBC 168 (L) 250 - 450 ug/dL   Saturation Ratios 14 (L) 17.9 - 39.5 %   UIBC 145 ug/dL  Ferritin     Status: Abnormal   Collection Time: 02/21/21  8:29 AM  Result Value Ref Range   Ferritin 569 (H) 24 - 336 ng/mL  Reticulocytes     Status: Abnormal   Collection Time: 02/21/21  8:29 AM  Result Value Ref Range   Retic Ct Pct 2.1 0.4 - 3.1 %   RBC. 2.78 (L) 4.22 - 5.81 MIL/uL   Retic Count, Absolute 58.7 19.0 - 186.0 K/uL   Immature Retic Fract 20.2 (H) 2.3 - 15.9 %  Glucose, capillary     Status: Abnormal   Collection Time: 02/21/21 11:16 AM  Result Value Ref Range   Glucose-Capillary 307 (H) 70 - 99 mg/dL  Glucose, capillary     Status: Abnormal   Collection Time: 02/21/21  4:12 PM  Result Value Ref Range   Glucose-Capillary 188 (H) 70 - 99 mg/dL  Glucose, capillary     Status: Abnormal   Collection Time: 02/21/21  8:33 PM  Result Value Ref Range    Glucose-Capillary 100 (H) 70 - 99 mg/dL  Basic metabolic panel     Status: Abnormal   Collection Time: 02/22/21  4:52 AM  Result Value Ref Range   Sodium 137 135 - 145 mmol/L   Potassium 3.7 3.5 - 5.1 mmol/L   Chloride 102 98 - 111 mmol/L   CO2 31 22 - 32 mmol/L   Glucose, Bld 90 70 - 99 mg/dL   BUN 9  6 - 20 mg/dL   Creatinine, Ser 0.40 (L) 0.61 - 1.24 mg/dL   Calcium 7.9 (L) 8.9 - 10.3 mg/dL   GFR, Estimated >60 >60 mL/min   Anion gap 4 (L) 5 - 15  CBC     Status: Abnormal   Collection Time: 02/22/21  4:52 AM  Result Value Ref Range   WBC 9.3 4.0 - 10.5 K/uL   RBC 2.13 (L) 4.22 - 5.81 MIL/uL   Hemoglobin 6.3 (LL) 13.0 - 17.0 g/dL   HCT 19.8 (L) 39.0 - 52.0 %   MCV 93.0 80.0 - 100.0 fL   MCH 29.6 26.0 - 34.0 pg   MCHC 31.8 30.0 - 36.0 g/dL   RDW 14.6 11.5 - 15.5 %   Platelets 743 (H) 150 - 400 K/uL   nRBC 0.0 0.0 - 0.2 %  Glucose, capillary     Status: None   Collection Time: 02/22/21  7:28 AM  Result Value Ref Range   Glucose-Capillary 77 70 - 99 mg/dL    Recent Results (from the past 240 hour(s))  Culture, blood (single)     Status: None   Collection Time: 02/13/21  5:51 AM   Specimen: BLOOD  Result Value Ref Range Status   Specimen Description   Final    BLOOD RIGHT ANTECUBITAL Performed at [facility], [place] [address] [place], [place] [address]    Special Requests   Final    BOTTLES DRAWN AEROBIC ONLY Blood Culture adequate volume Performed at [facility] [address] [place], [place] [address]    Culture   Final    NO GROWTH 5 DAYS Performed at [facility], [place] [address], [place], [place] [address]    Report Status 02/18/2021 FINAL  Final  Culture, blood (single)     Status: None   Collection Time: 02/14/21  2:12 AM   Specimen: BLOOD  Result Value Ref Range Status   Specimen Description   Final    BLOOD RIGHT ANTECUBITAL Performed at [facility] [address], [place], [place]  [address]    Special Requests   Final    BOTTLES DRAWN AEROBIC AND ANAEROBIC Blood Culture adequate volume Performed at [facility] [address] [place], [place] [address]    Culture   Final    NO GROWTH 5 DAYS Performed at [facility], [place] [address], [place], [place] [address]    Report Status 02/19/2021 FINAL  Final  Aerobic/Anaerobic Culture w Gram Stain (surgical/deep wound)     Status: None   Collection Time: 02/15/21  5:02 PM   Specimen: Abscess  Result Value Ref Range Status   Specimen Description   Final    ABSCESS LEFT SHOULDER Performed at [facility] [address], [place], [place] [address]    Special Requests   Final    NONE Performed at [facility], [place] [address] [place], [place] [address]    Gram Stain NO WBC SEEN NO ORGANISMS SEEN   Final   Culture   Final    FEW STAPHYLOCOCCUS AUREUS SUSCEPTIBILITIES PERFORMED ON PREVIOUS CULTURE WITHIN THE LAST 5 DAYS. NO ANAEROBES ISOLATED Performed at [facility], [place] [address] [place],  [address]    Report Status 02/21/2021 FINAL  Final  Aerobic/Anaerobic Culture w Gram Stain (surgical/deep wound)     Status: None   Collection Time: 02/15/21  5:06 PM   Specimen: Abscess  Result Value Ref Range Status   Specimen Description   Final    ABSCESS POST US GUIDED LEFT SUPRAVICULAR ABSCESS DRAIN [place] Performed at [facility], [place] [address], [place], [place] [address]    Special Requests   Final    NONE Performed at [facility], [place] [address], [place], [place] [address]    Gram Stain   Final    FEW WBC PRESENT,BOTH PMN AND MONONUCLEAR FEW GRAM POSITIVE COCCI IN PAIRS    Culture   Final    ABUNDANT STAPHYLOCOCCUS AUREUS NO ANAEROBES ISOLATED Performed at [facility], [place] [address], [place], [place] [address]    Report Status 02/21/2021 FINAL  Final   Organism ID, Bacteria  STAPHYLOCOCCUS AUREUS  Final      Susceptibility   Staphylococcus aureus - MIC*    CIPROFLOXACIN >=8 RESISTANT Resistant     ERYTHROMYCIN >=8 RESISTANT Resistant     GENTAMICIN <=0.5 SENSITIVE Sensitive     OXACILLIN 0.5 SENSITIVE Sensitive     TETRACYCLINE <=1 SENSITIVE Sensitive     VANCOMYCIN <=0.5 SENSITIVE Sensitive     TRIMETH/SULFA <=10 SENSITIVE Sensitive     CLINDAMYCIN <=0.25 SENSITIVE Sensitive     RIFAMPIN <=0.5 SENSITIVE Sensitive     Inducible Clindamycin NEGATIVE Sensitive     * ABUNDANT STAPHYLOCOCCUS AUREUS     Radiology Studies: No results found. CT SHOULDER RIGHT WO CONTRAST  Final Result    CT SHOULDER LEFT WO CONTRAST  Final Result    CT CHEST WO CONTRAST  Final Result    CT IMAGE GUIDED DRAINAGE BY PERCUTANEOUS CATHETER  Final Result    CT IMAGE GUIDED DRAINAGE BY PERCUTANEOUS CATHETER  Final Result    [place] IMAGE GUIDED FLUID DRAIN BY CATHETER  Final Result    MR CERVICAL SPINE W WO CONTRAST  Final Result    MR Lumbar Spine W Wo Contrast  Final Result    MR THORACIC SPINE W WO CONTRAST  Final Result    DG CHEST PORT 1 VIEW  Final Result    CT SHOULDER LEFT WO CONTRAST  Final Result    CT SHOULDER RIGHT WO CONTRAST  Final Result    CT CHEST WO CONTRAST  Final Result    DG Chest Port 1 View  Final Result    CT SHOULDER LEFT WO CONTRAST  Final Result    CT CHEST WO CONTRAST  Final Result    DG Knee 1-2 Views Right  Final Result    MR Shoulder Right W Wo Contrast  Final Result    MR Shoulder Left W Wo Contrast  Final Result    MR BRAIN WO CONTRAST  Final Result    IR US Guide Bx Asp/Drain  Final Result    CT CHEST ABDOMEN PELVIS W CONTRAST  Final Result    DG Shoulder Left  Final Result    DG Shoulder Right  Final Result    MR THORACIC SPINE W WO CONTRAST  Final Result    DG Chest Port 1 View  Final Result      Scheduled Meds:  sodium chloride   Intravenous Once   acetaminophen  500 mg Oral QID    enoxaparin (LOVENOX) injection  40 mg Subcutaneous Q24H   ferrous sulfate  325 mg Oral BID WC   gabapentin  400 mg Oral TID   HYDROmorphone   Intravenous Q4H   insulin aspart  0-15 Units Subcutaneous TID WC   insulin  aspart  0-5 Units Subcutaneous QHS   insulin aspart  4 Units Subcutaneous TID WC   insulin glargine  20 Units Subcutaneous Daily   mouth rinse  15 mL Mouth Rinse BID   polyethylene glycol  17 g Oral Daily   senna-docusate  1 tablet Oral BID   sodium chloride flush  5 mL Intracatheter Q8H   PRN Meds: sodium chloride, alum & mag hydroxide-simeth, diphenhydrAMINE **OR** diphenhydrAMINE, docusate sodium, HYDROmorphone (DILAUDID) injection, lip balm, magic mouthwash w/lidocaine, methocarbamol, metoCLOPramide **OR** metoCLOPramide (REGLAN) injection, naloxone **AND** sodium chloride flush, ondansetron (ZOFRAN) IV, oxyCODONE, traZODone Continuous Infusions:  sodium chloride Stopped (02/07/21 0508)    ceFAZolin (ANCEF) IV 2 g (02/22/21 0020)     LOS: 22 days  Time spent: Greater than 50% of the 30 minute visit was spent in counseling/coordination of care for the patient as laid out in the A&P.   Lorella Nimrod, MD [facility] 02/22/2021, 7:46 AM

## 2021-02-22 NOTE — Progress Notes (Addendum)
Subjective:  Patient alert and oriented sitting up in bed. Continued bilateral shoulder pain left worse than right and back pain but well controlled with current pain regimen. No nausea or vomiting, no chills.   Objective:  PE: VITALS:   Vitals:   02/21/21 2030 02/22/21 0014 02/22/21 0432 02/22/21 0437  BP: 111/84   114/90  Pulse: 74   79  Resp: 18 11 13 20   Temp: 97.8 F (36.6 C)   (!) 97.5 F (36.4 C)  TempSrc: Oral   Oral  SpO2: 100% 100% 99% 100%  Weight:      Height:       MSK: 0-20 degrees of active forward flexion of right shoulder, 0-10 deg left shoulder. No erythema. Wound vacs in place with good seal, 200 cc's in R shoulder canister, 100 cc's in L shoulder canister. Distal sensation and capillary refill intact. 5/5 grip strength bilaterally.   LABS  Results for orders placed or performed during the hospital encounter of 01/31/21 (from the past 24 hour(s))  Glucose, capillary     Status: Abnormal   Collection Time: 02/21/21 11:16 AM  Result Value Ref Range   Glucose-Capillary 307 (H) 70 - 99 mg/dL  Glucose, capillary     Status: Abnormal   Collection Time: 02/21/21  4:12 PM  Result Value Ref Range   Glucose-Capillary 188 (H) 70 - 99 mg/dL  Glucose, capillary     Status: Abnormal   Collection Time: 02/21/21  8:33 PM  Result Value Ref Range   Glucose-Capillary 100 (H) 70 - 99 mg/dL  Basic metabolic panel     Status: Abnormal   Collection Time: 02/22/21  4:52 AM  Result Value Ref Range   Sodium 137 135 - 145 mmol/L   Potassium 3.7 3.5 - 5.1 mmol/L   Chloride 102 98 - 111 mmol/L   CO2 31 22 - 32 mmol/L   Glucose, Bld 90 70 - 99 mg/dL   BUN 9 6 - 20 mg/dL   Creatinine, Ser 02/24/21 (L) 0.61 - 1.24 mg/dL   Calcium 7.9 (L) 8.9 - 10.3 mg/dL   GFR, Estimated 1.61 >09 mL/min   Anion gap 4 (L) 5 - 15  CBC     Status: Abnormal   Collection Time: 02/22/21  4:52 AM  Result Value Ref Range   WBC 9.3 4.0 - 10.5 K/uL   RBC 2.13 (L) 4.22 - 5.81 MIL/uL   Hemoglobin 6.3  (LL) 13.0 - 17.0 g/dL   HCT 02/24/21 (L) 45.4 - 09.8 %   MCV 93.0 80.0 - 100.0 fL   MCH 29.6 26.0 - 34.0 pg   MCHC 31.8 30.0 - 36.0 g/dL   RDW 11.9 14.7 - 82.9 %   Platelets 743 (H) 150 - 400 K/uL   nRBC 0.0 0.0 - 0.2 %  Glucose, capillary     Status: None   Collection Time: 02/22/21  7:28 AM  Result Value Ref Range   Glucose-Capillary 77 70 - 99 mg/dL    No results found.  Assessment/Plan:  Bilateral shoulder septic arthritis with bilateral psoas abscesses - L shoulder irrigation and debridement 6/16 - R shoulder irrigation and debridement with distal clavicle resection 6/17 - s/p drain placement 6/24 with IR for perispinal and psoas abscesses - s/p bilateral shoulder irrigation and debridement with wound vac placement bilaterally 6/29 - no increase in shoulder pain, no new changes in ROM, unlikely to see significant changes due to patient's rotator cuff arthropathy, plan is to keep  wound vacs in place with repeat imaging this weekend/early next week depending on clinical progress with possible repeat I&D next week     Armida Sans 02/22/2021, 8:40 AM

## 2021-02-22 NOTE — Progress Notes (Signed)
Hypoglycemic Event  CBG: 52  Treatment: 8 oz juice/soda  Symptoms: None  Follow-up CBG: Time:1730  CBG Result:75  Possible Reasons for Event: Medication regimen: insulin  Comments/MD notified:Dr. Nelson Chimes made aware - no new orders at this time    Laney Pastor

## 2021-02-22 NOTE — Progress Notes (Signed)
My attending, Dr. Daphine Deutscher saw and evaluated this patient today. He recommended no further surgical debridement of L chest wall at this time. Continue WTD BID. Abx per ID.

## 2021-02-22 NOTE — Progress Notes (Addendum)
Wet to dry dressing placed to left chest. Pt tolerated moderately well. PRN Analgesic administered prior.  Bilateral JP dressings changed

## 2021-02-23 DIAGNOSIS — A419 Sepsis, unspecified organism: Secondary | ICD-10-CM | POA: Diagnosis not present

## 2021-02-23 DIAGNOSIS — R6520 Severe sepsis without septic shock: Secondary | ICD-10-CM | POA: Diagnosis not present

## 2021-02-23 LAB — TYPE AND SCREEN
ABO/RH(D): B POS
Antibody Screen: NEGATIVE
Unit division: 0
Unit division: 0

## 2021-02-23 LAB — BASIC METABOLIC PANEL
Anion gap: 8 (ref 5–15)
BUN: 6 mg/dL (ref 6–20)
CO2: 31 mmol/L (ref 22–32)
Calcium: 8 mg/dL — ABNORMAL LOW (ref 8.9–10.3)
Chloride: 98 mmol/L (ref 98–111)
Creatinine, Ser: 0.47 mg/dL — ABNORMAL LOW (ref 0.61–1.24)
GFR, Estimated: >60 mL/min (ref >60)
Glucose, Bld: 94 mg/dL (ref 70–99)
Potassium: 4.2 mmol/L (ref 3.5–5.1)
Sodium: 137 mmol/L (ref 135–145)

## 2021-02-23 LAB — GLUCOSE, CAPILLARY
Glucose-Capillary: 132 mg/dL — ABNORMAL HIGH (ref 70–99)
Glucose-Capillary: 155 mg/dL — ABNORMAL HIGH (ref 70–99)
Glucose-Capillary: 68 mg/dL — ABNORMAL LOW (ref 70–99)
Glucose-Capillary: 100 mg/dL — ABNORMAL HIGH (ref 70–99)
Glucose-Capillary: 66 mg/dL — ABNORMAL LOW (ref 70–99)
Glucose-Capillary: 167 mg/dL — ABNORMAL HIGH (ref 70–99)

## 2021-02-23 LAB — BPAM RBC
Blood Product Expiration Date: 202207272359
Blood Product Expiration Date: 202207282359
ISSUE DATE / TIME: 202206301331
ISSUE DATE / TIME: 202206301704
Unit Type and Rh: 7300
Unit Type and Rh: 7300

## 2021-02-23 LAB — CBC
HCT: 25.6 % — ABNORMAL LOW (ref 39.0–52.0)
Hemoglobin: 8.3 g/dL — ABNORMAL LOW (ref 13.0–17.0)
MCH: 29.4 pg (ref 26.0–34.0)
MCHC: 32.4 g/dL (ref 30.0–36.0)
MCV: 90.8 fL (ref 80.0–100.0)
Platelets: 743 10*3/uL — ABNORMAL HIGH (ref 150–400)
RBC: 2.82 MIL/uL — ABNORMAL LOW (ref 4.22–5.81)
RDW: 16.3 % — ABNORMAL HIGH (ref 11.5–15.5)
WBC: 9.9 10*3/uL (ref 4.0–10.5)
nRBC: 0 % (ref 0.0–0.2)

## 2021-02-23 MED ORDER — OXYCODONE HCL 5 MG PO TABS
10.0000 mg | ORAL_TABLET | ORAL | Status: DC | PRN
  Administered 2021-02-23 – 2021-03-08 (×59): 10 mg via ORAL
  Filled 2021-02-23 (×61): qty 2

## 2021-02-23 MED ORDER — INSULIN GLARGINE 100 UNIT/ML ~~LOC~~ SOLN: 10.0000 [IU] | Freq: Every day | SUBCUTANEOUS | Status: DC

## 2021-02-23 MED ORDER — IPRATROPIUM-ALBUTEROL 0.5-2.5 (3) MG/3ML IN SOLN: 3.0000 mL | RESPIRATORY_TRACT | Status: DC | PRN

## 2021-02-23 MED ORDER — HYDRALAZINE HCL 20 MG/ML IJ SOLN: 10.0000 mg | INTRAMUSCULAR | Status: DC | PRN

## 2021-02-23 MED ORDER — INSULIN GLARGINE 100 UNIT/ML ~~LOC~~ SOLN
20.0000 [IU] | Freq: Every day | SUBCUTANEOUS | Status: DC
  Filled 2021-02-23: qty 0.2

## 2021-02-23 NOTE — Progress Notes (Signed)
ID Brief Note  ID Brief Note   Patient not seen, chart reviewed.   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  Monitor CBC and BMP on IV antibiotic Duration to be determined depending surgical plans  Dr Ninetta Lights is on call this weekend with questions. Otherwise, new ID team will follow from Monday   Odette Fraction, MD Infectious Disease Physician [facility] [address] [place], [place] [address] Phone: [phone]  Fax: [phone]

## 2021-02-23 NOTE — Plan of Care (Signed)
?  Problem: Clinical Measurements: ?Goal: Will remain free from infection ?Outcome: Progressing ?Goal: Diagnostic test results will improve ?Outcome: Progressing ?  ?Problem: Coping: ?Goal: Level of anxiety will decrease ?Outcome: Progressing ?  ?Problem: Pain Managment: ?Goal: General experience of comfort will improve ?Outcome: Progressing ?  ?

## 2021-02-23 NOTE — Progress Notes (Signed)
Physical Therapy Treatment Patient Details Name: John Ware MRN: [id number] DOB: [birth year] Today's Date: 02/23/2021    History of Present Illness Patient is a 55 y.o. male presented to ED with weakness and per wife generally unwell over the past 3 weeks. He had a positive home COVID test on 01/12/21. He had no respiratory symptoms at the time. He just had fevers that were responsive to APAP. He self isolated at home until 01/20/21 when he was at a party and experienced a fall. bil shoulder and spine xrays negative.  MRI brain=Punctate acute or subacute infarct versus artifact in the high left  frontal lobe. Otherwise, no evidence of recent infarction.  Underwent bilateral MRIs of his shoulders and he is still having fevers.  At the left shoulder was concerned for septic arthritis and left axillary abscess.  Orthopedics was reconsulted as well as IR.  Pt s/p  Left shoulder excisional debridement, deep abscess, with arthrotomy and irrigation and debridement of the joint with biceps tenotomy and excision of necrotic rotator cuff on 02/08/21. Patient s/p Right shoulder excisional debridement, including biceps tenolysis, Evacuation of purulent abscess, right subacromial space, as well as subcoracoid bursa, Right shoulder arthrotomy with irrigation and debridement of joint, Right shoulder open distal clavicle resection on 6/18.  Pt also s/p Incision and drainage of multiple left lateral chest wall abscesses along with excisional debridement on 6/18. PMH: significant of chronic pain, HTN, DM2, depression, spinal stenosis. Second I&D surgeries to bilateral shoulders and left axilla area 6/28. Wound vacs placed on bilateral shoulders.    PT Comments    Pt OOB in recliner. Min assist +2 for safety and line management for sit to stand. Min guard +2 for safety and line management for 450 feet ambulation. Pt declined rest break during ambulation, stated that he wanted to keep moving. Cues needed to extend trunk  during ambulation and to be more delicate with walker to decrease reliance on B UES. Pt continues to be pleasant and motivated for session.    Follow Up Recommendations  SNF     Equipment Recommendations  Rolling walker with 5" wheels    Recommendations for Other Services Rehab consult     Precautions / Restrictions Precautions Precautions: Fall    Mobility  Bed Mobility                    Transfers Overall transfer level: Needs assistance Equipment used: Rolling walker (2 wheeled) Transfers: Sit to/from Stand Sit to Stand: Min assist         General transfer comment: still having some difficulty performing self sit to stand due to recent shoulder surgery.  Ambulation/Gait Ambulation/Gait assistance: Min guard Gait Distance (Feet): 450 Feet Assistive device: Rolling walker (2 wheeled) Gait Pattern/deviations: Step-through pattern;Decreased stride length;Trunk flexed Gait velocity: decr   General Gait Details: cues for posture as pt continues to keep RW too far forward, cues to be delicate with walker, distance per pt preference   Stairs             Wheelchair Mobility    Modified Rankin (Stroke Patients Only)       Balance                                            Cognition Arousal/Alertness: Awake/alert Behavior During Therapy: WFL for tasks assessed/performed Overall Cognitive Status: Within Functional Limits  for tasks assessed Area of Impairment: Problem solving;Attention                                      Exercises      General Comments        Pertinent Vitals/Pain Pain Assessment: Faces Faces Pain Scale: Hurts a little bit Pain Location: lower back Pain Descriptors / Indicators: Sore Pain Intervention(s): Limited activity within patient's tolerance;Monitored during session    Home Living                      Prior Function            PT Goals (current goals can now be  found in the care plan section) Acute Rehab PT Goals Patient Stated Goal: to regain strength PT Goal Formulation: With patient Time For Goal Achievement: 02/23/21 Potential to Achieve Goals: Good    Frequency    Min 3X/week      PT Plan      Co-evaluation              AM-PAC PT "6 Clicks" Mobility   Outcome Measure  Help needed turning from your back to your side while in a flat bed without using bedrails?: A Lot Help needed moving from lying on your back to sitting on the side of a flat bed without using bedrails?: A Lot Help needed moving to and from a bed to a chair (including a wheelchair)?: A Lot Help needed standing up from a chair using your arms (e.g., wheelchair or bedside chair)?: A Lot Help needed to walk in hospital room?: A Little Help needed climbing 3-5 steps with a railing? : A Lot 6 Click Score: 13    End of Session Equipment Utilized During Treatment: Gait belt Activity Tolerance: Patient tolerated treatment well Patient left: in chair;with call bell/phone within reach;with chair alarm set Nurse Communication: Mobility status PT Visit Diagnosis: Unsteadiness on feet (R26.81);History of falling (Z91.81);Difficulty in walking, not elsewhere classified (R26.2)     Time: 2641-5830 PT Time Calculation (min) (ACUTE ONLY): 15 min  Charges:  $Gait Training: 8-22 mins                    Alma Friendly, PTA Student  Acute Rehabilitation Services Pager : [phone] Office : [phone]   Alma Friendly 02/23/2021, 3:32 PM

## 2021-02-23 NOTE — Progress Notes (Signed)
Referring Physician(s): Landau,J  Supervising Physician: Dr. Grace Isaac  Patient Status:  [facility] - In-pt  Chief Complaint: Shoulder/back pain   Subjective: Pt sitting up in chair States he is doing ok. Not much back pain. Had CT A/P yesterday, reviewed with Dr. Grace Isaac   Allergies: Tizanidine  Medications:  Current Facility-Administered Medications:    0.9 %  sodium chloride infusion (Manually program via Guardrails IV Fluids), , Intravenous, Once, Arnetha Courser, MD   0.9 %  sodium chloride infusion, , Intravenous, PRN, [name], MD, Last Rate: 10 mL/hr at 02/22/21 1738, 250 mL at 02/22/21 1738   acetaminophen (TYLENOL) tablet 500 mg, 500 mg, Oral, QID, Lewie Chamber, MD, 500 mg at 02/23/21 1316   alum & mag hydroxide-simeth (MAALOX/MYLANTA) 200-200-20 MG/5ML suspension 30 mL, 30 mL, Oral, Q4H PRN, [name], MD   ceFAZolin (ANCEF) IVPB 2g/100 mL premix, 2 g, Intravenous, Q8H, [name], MD, Last Rate: 200 mL/hr at 02/23/21 0803, 2 g at 02/23/21 0803   diphenhydrAMINE (BENADRYL) injection 12.5 mg, 12.5 mg, Intravenous, Q6H PRN **OR** diphenhydrAMINE (BENADRYL) 12.5 MG/5ML elixir 12.5 mg, 12.5 mg, Oral, Q6H PRN, Maczis, Michael M, PA-C   docusate sodium (COLACE) capsule 100 mg, 100 mg, Oral, BID PRN, [name], MD, 100 mg at 02/04/21 1409   enoxaparin (LOVENOX) injection 40 mg, 40 mg, Subcutaneous, Q24H, [name], MD, 40 mg at 02/23/21 0932   ferrous sulfate tablet 325 mg, 325 mg, Oral, BID WC, Arnetha Courser, MD, 325 mg at 02/23/21 0802   gabapentin (NEURONTIN) capsule 400 mg, 400 mg, Oral, TID, Lewie Chamber, MD, 400 mg at 02/22/21 2119   hydrALAZINE (APRESOLINE) injection 10 mg, 10 mg, Intravenous, Q4H PRN, Amin, Ankit Chirag, MD   HYDROmorphone (DILAUDID) 1 mg/mL PCA injection, , Intravenous, Q4H, Maczis, Elmer Sow, PA-C, 30 mg at 02/21/21 2018   insulin aspart (novoLOG) injection 0-15 Units, 0-15 Units, Subcutaneous, TID WC, [name], MD, 3 Units at 02/23/21  1317   insulin aspart (novoLOG) injection 0-5 Units, 0-5 Units, Subcutaneous, QHS, [name], MD, 2 Units at 02/17/21 2212   insulin aspart (novoLOG) injection 4 Units, 4 Units, Subcutaneous, TID WC, Sheikh, Omair [place], DO, 4 Units at 02/23/21 1317   insulin glargine (LANTUS) injection 20 Units, 20 Units, Subcutaneous, Daily, Arnetha Courser, MD, 20 Units at 02/23/21 1017   ipratropium-albuterol (DUONEB) 0.5-2.5 (3) MG/3ML nebulizer solution 3 mL, 3 mL, Nebulization, Q4H PRN, Amin, Ankit Chirag, MD   lip balm (CARMEX) ointment, , Topical, PRN, [name], MD   magic mouthwash w/lidocaine, 5 mL, Oral, QID PRN, [name], MD, 5 mL at 02/23/21 3557   MEDLINE mouth rinse, 15 mL, Mouth Rinse, BID, [name], MD, 15 mL at 02/23/21 0845   methocarbamol (ROBAXIN) tablet 500 mg, 500 mg, Oral, Q8H PRN, [name], MD, 500 mg at 02/20/21 0106   metoCLOPramide (REGLAN) tablet 5-10 mg, 5-10 mg, Oral, Q8H PRN **OR** metoCLOPramide (REGLAN) injection 5-10 mg, 5-10 mg, Intravenous, Q8H PRN, [name], MD   naloxone [facility]) injection 0.4 mg, 0.4 mg, Intravenous, PRN **AND** sodium chloride flush (NS) 0.9 % injection 9 mL, 9 mL, Intravenous, PRN, Maczis, Elmer Sow, PA-C   ondansetron [facility]) injection 4 mg, 4 mg, Intravenous, Q4H PRN, [name], MD   oxyCODONE (Oxy IR/ROXICODONE) immediate release tablet 10 mg, 10 mg, Oral, Q4H PRN, Amin, Ankit Chirag, MD, 10 mg at 02/23/21 1316   polyethylene glycol (MIRALAX / GLYCOLAX) packet 17 g, 17 g, Oral, Daily, [name], MD, 17 g at  02/23/21 0842   senna-docusate (Senokot-S) tablet 1 tablet, 1 tablet, Oral, BID, [name], MD, 1 tablet at 02/23/21 0842   sodium chloride flush (NS) 0.9 % injection 5 mL, 5 mL, Intracatheter, Q8H, Simonne Come, MD, 5 mL at 02/22/21 0634   traZODone (DESYREL) tablet 25 mg, 25 mg, Oral, QHS PRN, [name], MD, 25 mg at 02/21/21 2208    Vital Signs: BP 112/79 (BP Location: Right Arm)   Pulse 95   Temp 99.5 F  (37.5 C) (Oral)   Resp 17   Ht 5\' 10"  (1.778 m)   Wt 97.3 kg   SpO2 99%   BMI 30.78 kg/m   Physical Exam awake/alert;  R/L flank drains in place.  Scant serosanguinous output in each bulb, only 5 mL per drain recorded each day for the last couple days.  Imaging: CT ABDOMEN PELVIS W CONTRAST  Result Date: 02/23/2021 CLINICAL DATA:  Retroperitoneal abscesses EXAM: CT ABDOMEN AND PELVIS WITH CONTRAST TECHNIQUE: Multidetector CT imaging of the abdomen and pelvis was performed using the standard protocol following bolus administration of intravenous contrast. CONTRAST:  04/26/2021 OMNIPAQUE IOHEXOL 300 MG/ML  SOLN COMPARISON:  02/18/2021, 02/01/2021 FINDINGS: Lower chest: Bibasilar bandlike opacities compatible with atelectasis. Persistent 10 mm lingula nodule, image 30/6. Trace pleural effusions bilaterally. Normal heart size. No pericardial effusion. Left inferolateral chest open surgical wound from incision and debridement. Left inferior axillary prominent lymph nodes, which are partially imaged, suspect reactive adenopathy. Left posterior chest wall intramuscular thin curvilinear fluid collection measures 12 cm in width but only 1.7 cm in thickness compatible with a residual posterior chest wall fluid collection/abscess. This does appear to extend to the open surgical wound superiorly. Degenerative changes noted of the spine. Hepatobiliary: Focal fatty infiltration of the liver along the falciform ligament anteriorly. No biliary dilatation. Gallbladder unremarkable. Common bile duct nondilated. Pancreas: Unremarkable. No pancreatic ductal dilatation or surrounding inflammatory changes. Spleen: Normal in size without focal abnormality. Adrenals/Urinary Tract: Adrenal glands are unremarkable. Kidneys are normal, without renal calculi, focal lesion, or hydronephrosis. Bladder is unremarkable. Stomach/Bowel: Negative for bowel obstruction, significant dilatation, ileus, or free air. Normal appearing appendix  containing air. No abdominopelvic ascites, new fluid collection, hemorrhage, or hematoma. Vascular/Lymphatic: Aortic atherosclerosis. Negative for aneurysm. No occlusive process. Mesenteric and renal vasculature appear patent. No veno-occlusive process. Reproductive: No significant finding by CT. Other: Bilateral posterior retroperitoneal drains within the psoas musculature. No significant residual retroperitoneal abscess or fluid collection at the drain catheter sites. Stable drain catheter positions. Musculoskeletal: Diffuse body anasarca. Stable thoracolumbar degenerative changes and lower lumbar fusion hardware. No new osseous finding. IMPRESSION: Resolved bilateral psoas retroperitoneal abscesses status post percutaneous drains. Stable drain catheter positions. No new intra-abdominal or pelvic fluid collection or abscess. Left inferior axillary adenopathy presumed reactive/inflammatory. Inferolateral left chest open surgical wound from I and D. Residual left posterolateral chest wall fluid collection measuring 12 x 1.7 cm which appears to extend and communicate with the open surgical wound. Stable 10 mm lingula nodule. Electronically Signed   By: 04/03/2021.  Shick M.D.   On: 02/23/2021 08:13    Labs:  CBC: Recent Labs    02/19/21 0431 02/21/21 0505 02/22/21 0452 02/23/21 0436  WBC 8.7 8.8 9.3 9.9  HGB 7.7* 7.6* 6.3* 8.3*  HCT 23.5* 24.2* 19.8* 25.6*  PLT 630* 630* 743* 743*     COAGS: Recent Labs    01/31/21 1059 02/01/21 0022  INR 1.4* 1.3*  APTT 24  --      BMP: Recent  Labs    02/25/20 0956 06/23/20 1630 10/09/20 1155 01/31/21 0854 02/20/21 0815 02/21/21 0505 02/22/21 0452 02/23/21 0436  NA 139 138 137   < > 134* 131* 137 137  K 5.0 4.6 4.9   < > 4.3 4.7 3.7 4.2  CL 99 97 96   < > 95* 95* 102 98  CO2 23 20 22    < > 30 27 31 31   GLUCOSE 216* 413* 308*   < > 119* 371* 90 94  BUN 14 12 11    < > 7 10 9 6   CALCIUM 9.7 9.6 10.2   < > 8.3* 8.0* 7.9* 8.0*  CREATININE 0.88 1.08  0.89   < > 0.50* 0.56* 0.40* 0.47*  GFRNONAA 99 78 98   < > >60 >60 >60 >60  GFRAA 114 91 113  --   --   --   --   --    < > = values in this interval not displayed.     LIVER FUNCTION TESTS: Recent Labs    02/11/21 0539 02/12/21 0535 02/13/21 0551 02/14/21 0212  BILITOT 0.7 0.9 0.7 0.7  AST 25 29 49* 53*  ALT 15 17 26 30   ALKPHOS 55 61 60 53  PROT 4.9* 5.5* 5.2* 5.2*  ALBUMIN 1.9* 2.0* 1.8* 1.8*     Assessment and Plan: 55 yo male with sepsis/MSSA bacteremia secondary to left chest fluid collection/bilateral shoulder septic arthritis despite multiple OR debridements ; further complicated by development of left neck/supraclavicular fluid collection and bilateral psoas muscle fluid collections concerning for abscesses. S/p bilateral psoas abscess drains placed on 6/24. Output has decreased to minimal. CT reviewed, psoas collections essentially resolved. Both drains pulled at bedside without complications. No further IR interventions planned at this time, call if needed.   Electronically Signed: 02/15/21, PA-C 02/23/2021, 1:20 PM   I spent a total of 15 Minutes at the the patient's bedside AND on the patient's hospital floor or unit, greater than 50% of which was counseling/coordinating care for bilateral psoas/RP abscess drains

## 2021-02-23 NOTE — Progress Notes (Signed)
Subjective:  Patient alert and oriented sitting up in chair. Continued bilateral shoulder pain left worse than right but generally well controlled with current pain regimen. Complaining of pain at exit sites of psoas drains. No loss of seal of wound vacs.  Objective:  PE: VITALS:   Vitals:   02/23/21 0050 02/23/21 0405 02/23/21 0740 02/23/21 1143  BP: 122/88     Pulse: 79     Resp: 18 17 16 11   Temp: 98.6 F (37 C)     TempSrc: Oral     SpO2: 98% 96% 95% 96%  Weight:      Height:       MSK: 0-20 degrees of active forward flexion of right shoulder, 30 deg abduction, 0-5 deg left shoulder. Full ROM of bilateral elbows, wrist, and all fingers of hands. Wound vacs in place with good seal, 250 cc's in L shoulder canister, 150 cc's in R shoulder canister. Distal sensation and capillary refill intact. 5/5 grip strength bilaterally.     LABS  Results for orders placed or performed during the hospital encounter of 01/31/21 (from the past 24 hour(s))  Glucose, capillary     Status: Abnormal   Collection Time: 02/22/21  4:37 PM  Result Value Ref Range   Glucose-Capillary 52 (L) 70 - 99 mg/dL   Comment 1 Notify RN   Glucose, capillary     Status: None   Collection Time: 02/22/21  5:26 PM  Result Value Ref Range   Glucose-Capillary 75 70 - 99 mg/dL  Glucose, capillary     Status: None   Collection Time: 02/22/21  9:48 PM  Result Value Ref Range   Glucose-Capillary 98 70 - 99 mg/dL  Basic metabolic panel     Status: Abnormal   Collection Time: 02/23/21  4:36 AM  Result Value Ref Range   Sodium 137 135 - 145 mmol/L   Potassium 4.2 3.5 - 5.1 mmol/L   Chloride 98 98 - 111 mmol/L   CO2 31 22 - 32 mmol/L   Glucose, Bld 94 70 - 99 mg/dL   BUN 6 6 - 20 mg/dL   Creatinine, Ser 04/26/21 (L) 0.61 - 1.24 mg/dL   Calcium 8.0 (L) 8.9 - 10.3 mg/dL   GFR, Estimated 8.54 >62 mL/min   Anion gap 8 5 - 15  CBC     Status: Abnormal   Collection Time: 02/23/21  4:36 AM  Result Value Ref Range    WBC 9.9 4.0 - 10.5 K/uL   RBC 2.82 (L) 4.22 - 5.81 MIL/uL   Hemoglobin 8.3 (L) 13.0 - 17.0 g/dL   HCT 04/26/21 (L) 35.0 - 09.3 %   MCV 90.8 80.0 - 100.0 fL   MCH 29.4 26.0 - 34.0 pg   MCHC 32.4 30.0 - 36.0 g/dL   RDW 81.8 (H) 29.9 - 37.1 %   Platelets 743 (H) 150 - 400 K/uL   nRBC 0.0 0.0 - 0.2 %  Glucose, capillary     Status: Abnormal   Collection Time: 02/23/21  8:51 AM  Result Value Ref Range   Glucose-Capillary 132 (H) 70 - 99 mg/dL  Glucose, capillary     Status: Abnormal   Collection Time: 02/23/21 11:26 AM  Result Value Ref Range   Glucose-Capillary 155 (H) 70 - 99 mg/dL    CT ABDOMEN PELVIS W CONTRAST  Result Date: 02/23/2021 CLINICAL DATA:  Retroperitoneal abscesses EXAM: CT ABDOMEN AND PELVIS WITH CONTRAST TECHNIQUE: Multidetector CT imaging of the abdomen and  pelvis was performed using the standard protocol following bolus administration of intravenous contrast. CONTRAST:  OMNIPAQUE IOHEXOL 300 MG/ML  SOLN COMPARISON:  02/18/2021, 02/01/2021 FINDINGS: Lower chest: Bibasilar bandlike opacities compatible with atelectasis. Persistent 10 mm lingula nodule, image 30/6. Trace pleural effusions bilaterally. Normal heart size. No pericardial effusion. Left inferolateral chest open surgical wound from incision and debridement. Left inferior axillary prominent lymph nodes, which are partially imaged, suspect reactive adenopathy. Left posterior chest wall intramuscular thin curvilinear fluid collection measures 12 cm in width but only 1.7 cm in thickness compatible with a residual posterior chest wall fluid collection/abscess. This does appear to extend to the open surgical wound superiorly. Degenerative changes noted of the spine. Hepatobiliary: Focal fatty infiltration of the liver along the falciform ligament anteriorly. No biliary dilatation. Gallbladder unremarkable. Common bile duct nondilated. Pancreas: Unremarkable. No pancreatic ductal dilatation or surrounding inflammatory  changes. Spleen: Normal in size without focal abnormality. Adrenals/Urinary Tract: Adrenal glands are unremarkable. Kidneys are normal, without renal calculi, focal lesion, or hydronephrosis. Bladder is unremarkable. Stomach/Bowel: Negative for bowel obstruction, significant dilatation, ileus, or free air. Normal appearing appendix containing air. No abdominopelvic ascites, new fluid collection, hemorrhage, or hematoma. Vascular/Lymphatic: Aortic atherosclerosis. Negative for aneurysm. No occlusive process. Mesenteric and renal vasculature appear patent. No veno-occlusive process. Reproductive: No significant finding by CT. Other: Bilateral posterior retroperitoneal drains within the psoas musculature. No significant residual retroperitoneal abscess or fluid collection at the drain catheter sites. Stable drain catheter positions. Musculoskeletal: Diffuse body anasarca. Stable thoracolumbar degenerative changes and lower lumbar fusion hardware. No new osseous finding. IMPRESSION: Resolved bilateral psoas retroperitoneal abscesses status post percutaneous drains. Stable drain catheter positions. No new intra-abdominal or pelvic fluid collection or abscess. Left inferior axillary adenopathy presumed reactive/inflammatory. Inferolateral left chest open surgical wound from I and D. Residual left posterolateral chest wall fluid collection measuring 12 x 1.7 cm which appears to extend and communicate with the open surgical wound. Stable 10 mm lingula nodule. Electronically Signed   By: Judie Petit.  Shick M.D.   On: 02/23/2021 08:13    Assessment/Plan: Bilateral shoulder septic arthritis with bilateral psoas abscesses - L shoulder irrigation and debridement 6/16 - R shoulder irrigation and debridement with distal clavicle resection 6/17 - s/p drain placement 6/24 with IR for perispinal and psoas abscesses - s/p bilateral shoulder irrigation and debridement with wound vac placement bilaterally 6/29 - CT ordered by IR today  shows Resolved bilateral psoas retroperitoneal abscesses status post percutaneous drains. No new intra-abdominal or pelvic fluid collection or abscess. - will defer to IR regarding plans to pull drains - keep wound vacs in place, will order repeat CT scans Monday. Currently tentatively posted for repeat I&D with Dr. Everardo Pacific on Wednesday.  - IV antibiotics per ID   John Ware 02/23/2021, 12:30 PM

## 2021-02-23 NOTE — Progress Notes (Signed)
PROGRESS NOTE    John Ware  VQQ:[id number] DOB: Sep 11, 1965 DOA: 01/31/2021 PCP: Junie Spencer, FNP   Brief Narrative:  55 year old with history of chronic pain, HTN, DM2, COVID-positive at home 5/20 reporting of body pain, shoulder pain eventually came to the ER.  Found to have MSSA bacteremia started on IV Ancef.  Large fluid collection on the left lateral posterior chest and back was noted.  Underwent MRI T-spine and CT CAP showing large multiloculated effusion  and osteomyelitis of thoracic spine.  There was concerns for septic emboli but echo was negative for TEE performed on 6/13 showed large tricuspid vegetation.  General surgery and orthopedic consulted.  Underwent left shoulder debridement 02/08/2021.  Postop had increasing size of his abscess collection therefore general surgery performed repeat debridement with wound VAC placement.   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  Large left posterior wall and bilateral psoas muscle abscess, bilateral septic shoulder joint, osteomyelitis and discitis.  Also possible floaters but endopthalmitis was ruled out. - Left shoulder debridement 6/16, right shoulder debridement 6/17, drain placement by IR 6/24.  Status post bilateral shoulder debridement with wound VAC placement 6/29.  Pathology for necrotic lymph node pending - Continue IV Ancef -ID following - PCA for pain control. Oxycodone increased to 10mg  q4hrs prn -Seen by Ortho.   Recommends outpatient follow-up  Anemia of chronic disease.   -Oral iron supplements with bowel regimen.  Status post 2 units PRBC 6/30.  Closely monitor.   DKA.  Resolved. Type 2 diabetes mellitus.   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   CBG within goal.  Gabapentin 400 mg 3 times daily -Continue Lantus to 20 units daily. -Continue with SSI along with mealtime coverage of 4 units.   Hyponatremia.  Resolved  Ascending aorta dilatation (HCC) - TTE on 6/10 notes mild dilation of ascending aorta, 38 mm - follow TEE results as well: normal thoracic and ascending aorta noted on 02/05/21   Anxiety - Wellbutrin and Zoloft on hold for now in setting of hyponatremia      DVT prophylaxis: Lovenox Code Status:  Family Communication:    Status is: Inpatient  Remains inpatient appropriate because:Inpatient level of care appropriate due to severity of illness  Dispo: The patient is from: Home              Anticipated d/c is to: SNF              Patient currently is not medically stable to d/c.   Difficult to place patient No       Nutritional status           Body mass index is 30.78 kg/m.  Pressure Injury 01/31/21 Buttocks Right;Left;Posterior Stage 2 -  Partial thickness loss of dermis presenting as a shallow open injury with a red, pink wound bed without slough. (Active)  01/31/21 1546  Location: Buttocks  Location Orientation: Right;Left;Posterior  Staging: Stage  2 -  Partial thickness loss of dermis presenting as a shallow open injury with a red, pink wound bed without slough.  Wound Description (Comments):   Present on Admission: Yes      Subjective: Still having aches diffusely and thinks TCAR alone is not controlling it.  Overall feels okay  Review of Systems Otherwise negative except as per HPI, including: General: Denies fever, chills, night sweats or unintended weight loss. Resp: Denies cough, wheezing, shortness of  breath. Cardiac: Denies chest pain, palpitations, orthopnea, paroxysmal nocturnal dyspnea. GI: Denies abdominal pain, nausea, vomiting, diarrhea or constipation GU: Denies dysuria, frequency, hesitancy or incontinence MS: Denies muscle aches, joint pain or swelling Neuro: Denies headache, neurologic deficits (focal weakness, numbness, tingling), abnormal gait Psych: Denies anxiety, depression, SI/HI/AVH Skin: Denies new rashes or lesions ID: Denies sick contacts, exotic exposures, travel  Examination:  General exam: Appears calm and comfortable  Respiratory system: Clear to auscultation. Respiratory effort normal. Cardiovascular system: S1 & S2 heard, RRR. No JVD, murmurs, rubs, gallops or clicks. No pedal edema. Gastrointestinal system: Abdomen is nondistended, soft and nontender. No organomegaly or masses felt. Normal bowel sounds heard. Central nervous system: Alert and oriented. No focal neurological deficits. Extremities: Symmetric 5 x 5 power. Skin: No rashes, lesions or ulcers Psychiatry: Judgement and insight appear normal. Mood & affect appropriate.  Multiple drains and skin dressings noted throughout his body   Objective: Vitals:   02/22/21 2039 02/23/21 0010 02/23/21 0050 02/23/21 0405  BP: (!) 131/92  122/88   Pulse: 96  79   Resp: 18 17 18 17   Temp: 99.2 F (37.3 C)  98.6 F (37 C)   TempSrc: Oral  Oral   SpO2: 95% 96% 98% 96%  Weight:      Height:        Intake/Output Summary (Last 24 hours) at 02/23/2021 0734 Last data filed at 02/23/2021 0500 Gross per 24 hour  Intake 1429.33 ml  Output 2830 ml  Net -1400.67 ml   Filed Weights   02/05/21 0500 02/08/21 1540 02/20/21 1419  Weight: 97.3 kg 97.3 kg 97.3 kg     Data Reviewed:   CBC: Recent Labs  Lab 02/17/21 0546 02/18/21 0508 02/19/21 0431 02/21/21 0505 02/22/21 0452 02/23/21 0436  WBC 9.6 9.7 8.7 8.8 9.3 9.9  NEUTROABS 7.0 7.3 6.2 7.8*  --   --   HGB 7.5* 7.4* 7.7* 7.6* 6.3* 8.3*  HCT 23.1*  22.9* 23.5* 24.2* 19.8* 25.6*  MCV 92.4 93.5 92.5 94.5 93.0 90.8  PLT 573* 606* 630* 630* 743* 743*   Basic Metabolic Panel: Recent Labs  Lab 02/17/21 0546 02/18/21 0508 02/19/21 0431 02/20/21 0815 02/21/21 0505 02/22/21 0452 02/23/21 0436  NA 129* 133* 131* 134* 131* 137 137  K 4.3 4.2 4.3 4.3 4.7 3.7 4.2  CL 94* 95* 95* 95* 95* 102 98  CO2 29 30 28 30 27 31 31   GLUCOSE 115* 128* 100* 119* 371* 90 94  BUN 6 6 6 7 10 9 6   CREATININE 0.52* 0.46* 0.48* 0.50* 0.56* 0.40* 0.47*  CALCIUM 8.1* 8.3* 8.0* 8.3* 8.0* 7.9* 8.0*  MG 1.7 1.6* 1.7  --  1.8  --   --    GFR: Estimated Creatinine Clearance: 124.9 mL/min (A) (by C-G formula based on SCr of 0.47 mg/dL (L)). Liver Function Tests: No results for input(s): AST, ALT, ALKPHOS, BILITOT, PROT, ALBUMIN in the last 168 hours. No results for input(s): LIPASE, AMYLASE in the last 168 hours. No results for  input(s): AMMONIA in the last 168 hours. Coagulation Profile: No results for input(s): INR, PROTIME in the last 168 hours. Cardiac Enzymes: No results for input(s): CKTOTAL, CKMB, CKMBINDEX, TROPONINI in the last 168 hours. BNP (last 3 results) No results for input(s): PROBNP in the last 8760 hours. HbA1C: No results for input(s): HGBA1C in the last 72 hours. CBG: Recent Labs  Lab 02/22/21 0728 02/22/21 1205 02/22/21 1637 02/22/21 1726 02/22/21 2148  GLUCAP 77 130* 52* 75 98   Lipid Profile: No results for input(s): CHOL, HDL, LDLCALC, TRIG, CHOLHDL, LDLDIRECT in the last 72 hours. Thyroid Function Tests: No results for input(s): TSH, T4TOTAL, FREET4, T3FREE, THYROIDAB in the last 72 hours. Anemia Panel: Recent Labs    02/21/21 0829  VITAMINB12 1,975*  FOLATE 8.8  FERRITIN 569*  TIBC 168*  IRON 23*  RETICCTPCT 2.1   Sepsis Labs: No results for input(s): PROCALCITON, LATICACIDVEN in the last 168 hours.  Recent Results (from the past 240 hour(s))  Culture, blood (single)     Status: None   Collection Time:  02/14/21  2:12 AM   Specimen: BLOOD  Result Value Ref Range Status   Specimen Description   Final    BLOOD RIGHT ANTECUBITAL Performed at [facility], [address] [place], [place] [address]    Special Requests   Final    BOTTLES DRAWN AEROBIC AND ANAEROBIC Blood Culture adequate volume Performed at [facility], [address] [place], [place] [address]    Culture   Final    NO GROWTH 5 DAYS Performed at [facility], [address] [place], [place] [address]    Report Status 02/19/2021 FINAL  Final  Aerobic/Anaerobic Culture w Gram Stain (surgical/deep wound)     Status: None   Collection Time: 02/15/21  5:02 PM   Specimen: Abscess  Result Value Ref Range Status   Specimen Description   Final    ABSCESS LEFT SHOULDER Performed at [facility], [address] [place], [place] [address]    Special Requests   Final    NONE Performed at [facility], [address] [place], [place] [address]    Gram Stain NO WBC SEEN NO ORGANISMS SEEN   Final   Culture   Final    FEW STAPHYLOCOCCUS AUREUS SUSCEPTIBILITIES PERFORMED ON PREVIOUS CULTURE WITHIN THE LAST 5 DAYS. NO ANAEROBES ISOLATED Performed at [facility], [address] [place], [place] [address]    Report Status 02/21/2021 FINAL  Final  Aerobic/Anaerobic Culture w Gram Stain (surgical/deep wound)     Status: None   Collection Time: 02/15/21  5:06 PM   Specimen: Abscess  Result Value Ref Range Status   Specimen Description   Final    ABSCESS POST US GUIDED LEFT SUPRAVICULAR ABSCESS DRAIN PLCEMENT Performed at [facility], [address] [place], [place] [address]    Special Requests   Final    NONE Performed at [facility], [address] [place], [place] [address]    Gram Stain   Final    FEW WBC PRESENT,BOTH PMN AND MONONUCLEAR FEW GRAM POSITIVE COCCI IN PAIRS    Culture    Final    ABUNDANT STAPHYLOCOCCUS AUREUS NO ANAEROBES ISOLATED Performed at [facility], [address] [place], [place] [address]    Report Status 02/21/2021 FINAL  Final   Organism ID, Bacteria STAPHYLOCOCCUS AUREUS  Final  Susceptibility   Staphylococcus aureus - MIC*    CIPROFLOXACIN >=8 RESISTANT Resistant     ERYTHROMYCIN >=8 RESISTANT Resistant     GENTAMICIN <=0.5 SENSITIVE Sensitive     OXACILLIN 0.5 SENSITIVE Sensitive     TETRACYCLINE <=1 SENSITIVE Sensitive     VANCOMYCIN <=0.5 SENSITIVE Sensitive     TRIMETH/SULFA <=10 SENSITIVE Sensitive     CLINDAMYCIN <=0.25 SENSITIVE Sensitive     RIFAMPIN <=0.5 SENSITIVE Sensitive     Inducible Clindamycin NEGATIVE Sensitive     * ABUNDANT STAPHYLOCOCCUS AUREUS         Radiology Studies: No results found.      Scheduled Meds:  sodium chloride   Intravenous Once   acetaminophen  500 mg Oral QID   enoxaparin (LOVENOX) injection  40 mg Subcutaneous Q24H   ferrous sulfate  325 mg Oral BID WC   gabapentin  400 mg Oral TID   HYDROmorphone   Intravenous Q4H   insulin aspart  0-15 Units Subcutaneous TID WC   insulin aspart  0-5 Units Subcutaneous QHS   insulin aspart  4 Units Subcutaneous TID WC   insulin glargine  20 Units Subcutaneous Daily   mouth rinse  15 mL Mouth Rinse BID   polyethylene glycol  17 g Oral Daily   senna-docusate  1 tablet Oral BID   sodium chloride flush  5 mL Intracatheter Q8H   Continuous Infusions:  sodium chloride 250 mL (02/22/21 1738)    ceFAZolin (ANCEF) IV 2 g (02/22/21 2340)     LOS: 23 days   Time spent= 35 mins    Moria Brophy Joline Maxcy, MD [facility]  If 7PM-7AM, please contact night-coverage  02/23/2021, 7:34 AM

## 2021-02-23 NOTE — Progress Notes (Signed)
Hypoglycemic Event  CBG: 66  Treatment: 8 oz juice/soda  Symptoms: None  Follow-up CBG: Time: 1642 CBG Result: 66  Possible Reasons for Event: Inadequate meal intake  Comments/MD notified: Nelson Chimes, MD    Vivi Martens

## 2021-02-23 NOTE — Progress Notes (Signed)
RN notified by CCMD that pt has had 5 beats of Vtach. Pt's  vital signs are stable. NP Blount made aware. Will continue to monitor.

## 2021-02-23 NOTE — Progress Notes (Signed)
3 Days Post-Op  Subjective: CC: Seen for PM dressing change. Very pleasant and thankful. Reports he is tolerating dressing changes.   Objective: Vital signs in last 24 hours: Temp:  [98 F (36.7 C)-99.5 F (37.5 C)] 99.5 F (37.5 C) (07/01 1235) Pulse Rate:  [79-96] 95 (07/01 1235) Resp:  [11-18] 17 (07/01 1235) BP: (110-131)/(74-92) 112/79 (07/01 1235) SpO2:  [95 %-99 %] 99 % (07/01 1235) Last BM Date: 02/22/21  Intake/Output from previous day: 06/30 0701 - 07/01 0700 In: 1429.3 [P.O.:600; Blood:819.3] Out: 2830 [Urine:2800; Drains:30] Intake/Output this shift: Total I/O In: 1313.5 [P.O.:720; I.V.:193.5; IV Piggyback:400] Out: 501 [Urine:500; Stool:1]  PE: Left chest wall: Larger wound measures 10cm x 3cm. The wound tracks 14cm superior/posteriorly. The base of the wound is with healthy granulation tissue. There is no drainage. Periwound without cellulitis. Smaller wound as seen in picture below. There is granulation tissue at the base. No drainage. No surrounding cellulitis.         Lab Results:  Recent Labs    02/22/21 0452 02/23/21 0436  WBC 9.3 9.9  HGB 6.3* 8.3*  HCT 19.8* 25.6*  PLT 743* 743*   BMET Recent Labs    02/22/21 0452 02/23/21 0436  NA 137 137  K 3.7 4.2  CL 102 98  CO2 31 31  GLUCOSE 90 94  BUN 9 6  CREATININE 0.40* 0.47*  CALCIUM 7.9* 8.0*   PT/INR No results for input(s): LABPROT, INR in the last 72 hours. CMP     Component Value Date/Time   NA 137 02/23/2021 0436   NA 137 10/09/2020 1155   K 4.2 02/23/2021 0436   CL 98 02/23/2021 0436   CO2 31 02/23/2021 0436   GLUCOSE 94 02/23/2021 0436   BUN 6 02/23/2021 0436   BUN 11 10/09/2020 1155   CREATININE 0.47 (L) 02/23/2021 0436   CALCIUM 8.0 (L) 02/23/2021 0436   PROT 5.2 (L) 02/14/2021 0212   PROT 7.6 10/09/2020 1155   ALBUMIN 1.8 (L) 02/14/2021 0212   ALBUMIN 4.7 10/09/2020 1155   AST 53 (H) 02/14/2021 0212   ALT 30 02/14/2021 0212   ALKPHOS 53 02/14/2021 0212    BILITOT 0.7 02/14/2021 0212   BILITOT 0.8 10/09/2020 1155   GFRNONAA >60 02/23/2021 0436   GFRAA 113 10/09/2020 1155   Lipase  No results found for: LIPASE     Studies/Results: CT ABDOMEN PELVIS W CONTRAST  Result Date: 02/23/2021 CLINICAL DATA:  Retroperitoneal abscesses EXAM: CT ABDOMEN AND PELVIS WITH CONTRAST TECHNIQUE: Multidetector CT imaging of the abdomen and pelvis was performed using the standard protocol following bolus administration of intravenous contrast. CONTRAST:  OMNIPAQUE IOHEXOL 300 MG/ML  SOLN COMPARISON:  02/18/2021, 02/01/2021 FINDINGS: Lower chest: Bibasilar bandlike opacities compatible with atelectasis. Persistent 10 mm lingula nodule, image 30/6. Trace pleural effusions bilaterally. Normal heart size. No pericardial effusion. Left inferolateral chest open surgical wound from incision and debridement. Left inferior axillary prominent lymph nodes, which are partially imaged, suspect reactive adenopathy. Left posterior chest wall intramuscular thin curvilinear fluid collection measures 12 cm in width but only 1.7 cm in thickness compatible with a residual posterior chest wall fluid collection/abscess. This does appear to extend to the open surgical wound superiorly. Degenerative changes noted of the spine. Hepatobiliary: Focal fatty infiltration of the liver along the falciform ligament anteriorly. No biliary dilatation. Gallbladder unremarkable. Common bile duct nondilated. Pancreas: Unremarkable. No pancreatic ductal dilatation or surrounding inflammatory changes. Spleen: Normal in size without focal abnormality. Adrenals/Urinary  Tract: Adrenal glands are unremarkable. Kidneys are normal, without renal calculi, focal lesion, or hydronephrosis. Bladder is unremarkable. Stomach/Bowel: Negative for bowel obstruction, significant dilatation, ileus, or free air. Normal appearing appendix containing air. No abdominopelvic ascites, new fluid collection, hemorrhage, or hematoma.  Vascular/Lymphatic: Aortic atherosclerosis. Negative for aneurysm. No occlusive process. Mesenteric and renal vasculature appear patent. No veno-occlusive process. Reproductive: No significant finding by CT. Other: Bilateral posterior retroperitoneal drains within the psoas musculature. No significant residual retroperitoneal abscess or fluid collection at the drain catheter sites. Stable drain catheter positions. Musculoskeletal: Diffuse body anasarca. Stable thoracolumbar degenerative changes and lower lumbar fusion hardware. No new osseous finding. IMPRESSION: Resolved bilateral psoas retroperitoneal abscesses status post percutaneous drains. Stable drain catheter positions. No new intra-abdominal or pelvic fluid collection or abscess. Left inferior axillary adenopathy presumed reactive/inflammatory. Inferolateral left chest open surgical wound from I and D. Residual left posterolateral chest wall fluid collection measuring 12 x 1.7 cm which appears to extend and communicate with the open surgical wound. Stable 10 mm lingula nodule. Electronically Signed   By: Judie Petit.  Shick M.D.   On: 02/23/2021 08:13    Anti-infectives: Anti-infectives (From admission, onward)    Start     Dose/Rate Route Frequency Ordered Stop   02/20/21 1630  vancomycin (VANCOCIN) powder  Status:  Discontinued          As needed 02/20/21 1649 02/20/21 1943   02/10/21 0727  ceFAZolin (ANCEF) 2-4 GM/100ML-% IVPB       Note to Pharmacy: Lyda Kalata   : cabinet override      02/10/21 0727 02/10/21 1856   02/01/21 0600  ceFAZolin (ANCEF) IVPB 2g/100 mL premix        2 g 200 mL/hr over 30 Minutes Intravenous Every 8 hours 02/01/21 0414     01/31/21 1800  cefTRIAXone (ROCEPHIN) 1 g in sodium chloride 0.9 % 100 mL IVPB  Status:  Discontinued        1 g 200 mL/hr over 30 Minutes Intravenous Every 24 hours 01/31/21 1453 02/01/21 0425   01/31/21 1115  vancomycin (VANCOREADY) IVPB 2000 mg/400 mL        2,000 mg 200 mL/hr over 120  Minutes Intravenous  Once 01/31/21 1105 01/31/21 1320   01/31/21 1045  ceFEPIme (MAXIPIME) 2 g in sodium chloride 0.9 % 100 mL IVPB        2 g 200 mL/hr over 30 Minutes Intravenous  Once 01/31/21 1035 01/31/21 1129   01/31/21 1045  metroNIDAZOLE (FLAGYL) IVPB 500 mg        500 mg 100 mL/hr over 60 Minutes Intravenous  Once 01/31/21 1035 01/31/21 1208   01/31/21 1045  vancomycin (VANCOCIN) IVPB 1000 mg/200 mL premix  Status:  Discontinued        1,000 mg 200 mL/hr over 60 Minutes Intravenous  Once 01/31/21 1035 01/31/21 1105        Assessment/Plan Complex left lateral chest wall and axillary abscess POD 13 S/p Incision and drainage of multiple left lateral chest wall abscesses along with excisional debridement of left lateral chest wall skin, soft tissue, muscle, and fascia and axilla 6/18 Dr. Andrey Campanile POD 3 s/p exploration of the left axilla - Dr. Daphine Deutscher - 6/28 - Cx with abundant staphylococcus aureus - ID following and managing antibiotics - Axillary nodule (left) path without malignancy  - Continue BID dressing changes   Bilateral shoulder abscess, s/p debridement 6/16, 6/18 and 6/28 - Per Dr. Dion Saucier - They are planning for repeat CT scans  on Monday and tentatively posted for repeat I&D with Dr. Everardo Pacific on Wednesday    ID - currently ancef 6/9>>  FEN - Reg VTE - lovenox Foley - none   Sepsis on admission, MSSA bacteremia Endocarditis T3-6, T10-11 acute osteomyelitis L1-L3 osteomyelitis discitis ? Septic arthitis right T3-4 and T4-5 facets on MRI Bilateral psoas abscess, R>L (3.7cm) - s/p drain placement x 2 on 6/23 by IR (now out) HTN DM Chronic back pain   LOS: 23 days    [name] , [facility] 02/23/2021, 4:07 PM Please see Amion for pager number during day hours 7:00am-4:30pm

## 2021-02-24 DIAGNOSIS — R6520 Severe sepsis without septic shock: Secondary | ICD-10-CM | POA: Diagnosis not present

## 2021-02-24 DIAGNOSIS — A419 Sepsis, unspecified organism: Secondary | ICD-10-CM | POA: Diagnosis not present

## 2021-02-24 LAB — BASIC METABOLIC PANEL
Anion gap: 9 (ref 5–15)
BUN: 6 mg/dL (ref 6–20)
CO2: 29 mmol/L (ref 22–32)
Calcium: 8.3 mg/dL — ABNORMAL LOW (ref 8.9–10.3)
Chloride: 94 mmol/L — ABNORMAL LOW (ref 98–111)
Creatinine, Ser: 0.47 mg/dL — ABNORMAL LOW (ref 0.61–1.24)
GFR, Estimated: >60 mL/min (ref >60)
Glucose, Bld: 126 mg/dL — ABNORMAL HIGH (ref 70–99)
Potassium: 4.2 mmol/L (ref 3.5–5.1)
Sodium: 132 mmol/L — ABNORMAL LOW (ref 135–145)

## 2021-02-24 LAB — CBC
HCT: 28.9 % — ABNORMAL LOW (ref 39.0–52.0)
Hemoglobin: 9.2 g/dL — ABNORMAL LOW (ref 13.0–17.0)
MCH: 29.3 pg (ref 26.0–34.0)
MCHC: 31.8 g/dL (ref 30.0–36.0)
MCV: 92 fL (ref 80.0–100.0)
Platelets: 823 10*3/uL — ABNORMAL HIGH (ref 150–400)
RBC: 3.14 MIL/uL — ABNORMAL LOW (ref 4.22–5.81)
RDW: 15.9 % — ABNORMAL HIGH (ref 11.5–15.5)
WBC: 11.9 10*3/uL — ABNORMAL HIGH (ref 4.0–10.5)
nRBC: 0 % (ref 0.0–0.2)

## 2021-02-24 LAB — GLUCOSE, CAPILLARY
Glucose-Capillary: 198 mg/dL — ABNORMAL HIGH (ref 70–99)
Glucose-Capillary: 146 mg/dL — ABNORMAL HIGH (ref 70–99)
Glucose-Capillary: 143 mg/dL — ABNORMAL HIGH (ref 70–99)

## 2021-02-24 LAB — MAGNESIUM: Magnesium: 1.8 mg/dL (ref 1.7–2.4)

## 2021-02-24 MED ORDER — INSULIN GLARGINE 100 UNIT/ML ~~LOC~~ SOLN
15.0000 [IU] | Freq: Every day | SUBCUTANEOUS | Status: DC
  Administered 2021-02-24 – 2021-02-27 (×2): 15 [IU] via SUBCUTANEOUS
  Filled 2021-02-24 (×4): qty 0.15

## 2021-02-24 NOTE — Progress Notes (Signed)
Occupational Therapy Treatment Patient Details Name: John Ware MRN: [id number] DOB: [birth year] Today's Date: 02/24/2021    History of present illness Patient is a 55 y.o. male presented to ED with weakness and per wife generally unwell over the past 3 weeks. He had a positive home COVID test on 01/12/21. He had no respiratory symptoms at the time. He just had fevers that were responsive to APAP. He self isolated at home until 01/20/21 when he was at a party and experienced a fall. bil shoulder and spine xrays negative.  MRI brain=Punctate acute or subacute infarct versus artifact in the high left  frontal lobe. Otherwise, no evidence of recent infarction.  Underwent bilateral MRIs of his shoulders and he is still having fevers.  At the left shoulder was concerned for septic arthritis and left axillary abscess.  Orthopedics was reconsulted as well as IR.  Pt s/p  Left shoulder excisional debridement, deep abscess, with arthrotomy and irrigation and debridement of the joint with biceps tenotomy and excision of necrotic rotator cuff on 02/08/21. Patient s/p Right shoulder excisional debridement, including biceps tenolysis, Evacuation of purulent abscess, right subacromial space, as well as subcoracoid bursa, Right shoulder arthrotomy with irrigation and debridement of joint, Right shoulder open distal clavicle resection on 6/18.  Pt also s/p Incision and drainage of multiple left lateral chest wall abscesses along with excisional debridement on 6/18. PMH: significant of chronic pain, HTN, DM2, depression, spinal stenosis. Second I&D surgeries to bilateral shoulders and left axilla area 6/28. Wound vacs placed on bilateral shoulders.   OT comments  Treatment focused on self care tasks. Increased amount of time in room while patient had BM and waiting on patient to be able to use PCA again. Patient exhibits improved ability to participate in ADLs and perform functional mobility with RW. Limited by wires/leads  and lines resulting in needing +2 assistance for safety. Patient min guard for ambulation and toilet transfer. Needed max asssistance for toileting - his spouse helped him - as impaired shoulder ROM limits his ability to perform task functionally. Patient required mod assist don underwear and pants - clingy material and gripper socks made task more difficult. Patient's wound vac beeping and Rn informed of poor seal. Packing of left chest wound also saturated and falling out. Rn in room at end of treatment to assess wounds. Education with patient's spouse in regards to potential DME needs at discharge. Expect patient will now be able to go home with spouse at discharge.    Follow Up Recommendations  Home health OT    Equipment Recommendations  3 in 1 bedside commode    Recommendations for Other Services      Precautions / Restrictions Precautions Precautions: Fall Precaution Comments: B UE slings for comfort, Bilateral Rotator Cuff Arthopathy Required Braces or Orthoses: Sling Restrictions Other Position/Activity Restrictions: per orders 6/18 patient is WBAT B UEs       Mobility Bed Mobility   Bed Mobility: Sit to Supine       Sit to supine: Min assist   General bed mobility comments: Min assist for trunk negotiation to return to supine.    Transfers Overall transfer level: Needs assistance Equipment used: Rolling walker (2 wheeled) Transfers: Sit to/from Stand Sit to Stand: Min guard;+2 safety/equipment Stand pivot transfers: Min guard;+2 safety/equipment       General transfer comment: +2 for lines/leads management but overall min guard with RW to ambulate    Balance Overall balance assessment: Needs assistance Sitting-balance support: No  upper extremity supported Sitting balance-[name]: Good     Standing balance support: During functional activity Standing balance-[name]: Fair Standing balance comment: able to take hands off of walker for dressing  task.                           ADL either performed or assessed with clinical judgement   ADL Overall ADL's : Needs assistance/impaired Eating/Feeding: Set up;Sitting Eating/Feeding Details (indicate cue type and reason): able to drink from cup. Reports he has been feeding himself.                 Lower Body Dressing: Moderate assistance;Sit to/from stand Lower Body Dressing Details (indicate cue type and reason): assistance to get feet through openings of underwear and pajama bottoms due to clinginess of material and pulling up over left hip. Toilet Transfer: Min guard;+2 for safety/equipment;Ambulation;RW;Grab bars Toilet Transfer Details (indicate cue type and reason): min guard for transfer on to toilet and off. +2 needed for IV pole and managing lines and min guard for safety Toileting- Clothing Manipulation and Hygiene: Maximal assistance;Sit to/from stand Toileting - Clothing Manipulation Details (indicate cue type and reason): continues to need max assist for toileting due to impaired shoulder ROM but assists with clothing management.             Vision Patient Visual Report: No change from baseline     Perception     Praxis      Cognition Arousal/Alertness: Awake/alert Behavior During Therapy: WFL for tasks assessed/performed Overall Cognitive Status: Within Functional Limits for tasks assessed                                          Exercises     Shoulder Instructions       General Comments      Pertinent Vitals/ Pain       Pain Assessment: Faces Faces Pain Scale: Hurts little more Pain Location: lower back Pain Descriptors / Indicators: Sore;Grimacing Pain Intervention(s): PCA encouraged;Monitored during session;Limited activity within patient's tolerance  Home Living                                          Prior Functioning/Environment              Frequency  Min 2X/week         Progress Toward Goals  OT Goals(current goals can now be found in the care plan section)  Progress towards OT goals: Progressing toward goals  Acute Rehab OT Goals Patient Stated Goal: to go home at discharge OT Goal Formulation: With patient Time For Goal Achievement: 02/28/21 Potential to Achieve Goals: Good  Plan Discharge plan needs to be updated    Co-evaluation          OT goals addressed during session: ADL's and self-care      AM-PAC OT "6 Clicks" Daily Activity     Outcome Measure   Help from another person eating meals?: A Little Help from another person taking care of personal grooming?: A Little Help from another person toileting, which includes using toliet, bedpan, or urinal?: A Lot Help from another person bathing (including washing, rinsing, drying)?: A Lot Help from another person to put on and  taking off regular upper body clothing?: A Lot Help from another person to put on and taking off regular lower body clothing?: A Lot 6 Click Score: 14    End of Session Equipment Utilized During Treatment: Rolling walker  OT Visit Diagnosis: Unsteadiness on feet (R26.81);Other abnormalities of gait and mobility (R26.89);History of falling (Z91.81);Muscle weakness (generalized) (M62.81);Pain   Activity Tolerance Patient tolerated treatment well   Patient Left in bed;with call bell/phone within reach;with nursing/sitter in room   Nurse Communication Mobility status        Time: 1425-1510 OT Time Calculation (min): 45 min  Charges: OT General Charges $OT Visit: 1 Visit OT Treatments $Self Care/Home Management : 38-52 mins  Shakiah Wester, OTR/L Acute Care Rehab Services  Office [phone] Pager: [phone]    Kelli Churn 02/24/2021, 3:57 PM

## 2021-02-24 NOTE — Progress Notes (Signed)
PROGRESS NOTE    John Ware  NWG:[id number] DOB: Dec 26, 1965 DOA: 01/31/2021 PCP: Junie Spencer, FNP   Brief Narrative:  55 year old with history of chronic pain, HTN, DM2, COVID-positive at home 5/20 reporting of body pain, shoulder pain eventually came to the ER.  Found to have MSSA bacteremia started on IV Ancef.  Large fluid collection on the left lateral posterior chest and back was noted.  Underwent MRI T-spine and CT CAP showing large multiloculated effusion  and osteomyelitis of thoracic spine.  There was concerns for septic emboli but echo was negative for TEE performed on 6/13 showed large tricuspid vegetation.  General surgery and orthopedic consulted.  Underwent left shoulder debridement 02/08/2021.  Postop had increasing size of his abscess collection therefore general surgery performed repeat debridement with wound VAC placement.   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  Large left posterior wall and bilateral psoas muscle abscess, bilateral septic shoulder joint, osteomyelitis and discitis.  Also possible floaters but endopthalmitis was ruled out. - Left shoulder debridement 6/16, right shoulder debridement 6/17, drain placement by IR 6/24.  Status post bilateral shoulder debridement with wound VAC placement 6/29.  Pathology for necrotic lymph node -benign adipose tissue - Continue IV Ancef -ID following - PCA for pain control. Oxycodone increased to 10mg  q4hrs prn - Plans to  repeat scan on Monday and possible OR by Ortho on Wednesday.  Anemia of chronic disease.   -Oral iron supplements with bowel regimen.  Status post 2 units PRBC 6/30.  Closely monitor.   DKA.  Resolved.  Type 2 diabetes mellitus.   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   CBG within goal.  Gabapentin 400 mg 3 times daily -Due to intermittent hypoglycemia, discontinue Premeal coverage.  Reduce Lantus to 50 units daily - ISS   Hyponatremia.  Resolved  Ascending aorta dilatation (HCC) - TTE on 6/10 notes mild dilation of ascending aorta, 38 mm - follow TEE results as well: normal thoracic and ascending aorta noted on 02/05/21   Anxiety - Wellbutrin and Zoloft on hold for now in setting of hyponatremia      DVT prophylaxis: Lovenox Code Status: Full Family Communication:    Status is: Inpatient  Remains inpatient appropriate because:Inpatient level of care appropriate due to severity of illness.  Maintain hospital stay until cleared by orthopedic and general surgery.  Complex surgical case with multiple wounds requiring debridement  Dispo: The patient is from: Home              Anticipated d/c is to: SNF              Patient currently is not medically stable to d/c.   Difficult to place patient No     Body mass index is 30.78 kg/m.  Pressure Injury 01/31/21 Buttocks Right;Left;Posterior Stage 2 -  Partial thickness loss of dermis presenting as a shallow open injury with a red, pink wound bed  without slough. (Active)  01/31/21 1546  Location: Buttocks  Location Orientation: Right;Left;Posterior  Staging: Stage 2 -  Partial thickness loss of dermis presenting as a shallow open injury with a red, pink wound bed without slough.  Wound Description (Comments):   Present on Admission: Yes      Subjective: Pain is better controlled, no new complaints.  Tells me he is having minimum 1-2 bowel movements daily  Review of Systems Otherwise negative  except as per HPI, including: General: Denies fever, chills, night sweats or unintended weight loss. Resp: Denies cough, wheezing, shortness of breath. Cardiac: Denies chest pain, palpitations, orthopnea, paroxysmal nocturnal dyspnea. GI: Denies abdominal pain, nausea, vomiting, diarrhea or constipation GU: Denies dysuria, frequency, hesitancy or incontinence MS: Denies muscle aches, joint pain or swelling Neuro: Denies headache, neurologic deficits (focal weakness, numbness, tingling), abnormal gait Psych: Denies anxiety, depression, SI/HI/AVH Skin: Denies new rashes or lesions ID: Denies sick contacts, exotic exposures, travel  Examination:  Constitutional: Not in acute distress Respiratory: Clear to auscultation bilaterally Cardiovascular: Normal sinus rhythm, no rubs Abdomen: Nontender nondistended good bowel sounds Musculoskeletal: No edema noted Skin: No rashes seen Neurologic: CN 2-12 grossly intact.  And nonfocal Psychiatric: Normal judgment and insight. Alert and oriented x 3. Normal mood. Multiple drains, wound VAC and skin dressings noted throughout his body   Objective: Vitals:   02/24/21 0026 02/24/21 0400 02/24/21 0423 02/24/21 0851  BP:   (!) 144/80   Pulse:   (!) 103   Resp: 18 16 16 15   Temp:   100 F (37.8 C)   TempSrc:   Oral   SpO2: 98% 98% 100% 98%  Weight:      Height:        Intake/Output Summary (Last 24 hours) at 02/24/2021 1037 Last data filed at 02/24/2021 0900 Gross per 24 hour  Intake 1139.99 ml  Output 2151 ml  Net -1011.01 ml   Filed Weights   02/05/21 0500 02/08/21 1540 02/20/21 1419  Weight: 97.3 kg 97.3 kg 97.3 kg     Data Reviewed:   CBC: Recent Labs  Lab 02/18/21 0508 02/19/21 0431 02/21/21 0505 02/22/21 0452 02/23/21 0436 02/24/21 0517  WBC 9.7 8.7 8.8 9.3 9.9 11.9*  NEUTROABS 7.3 6.2 7.8*  --   --   --   HGB 7.4* 7.7* 7.6* 6.3* 8.3* 9.2*  HCT 22.9* 23.5* 24.2* 19.8* 25.6* 28.9*  MCV 93.5 92.5 94.5 93.0 90.8 92.0   PLT 606* 630* 630* 743* 743* 823*   Basic Metabolic Panel: Recent Labs  Lab 02/18/21 0508 02/19/21 0431 02/20/21 0815 02/21/21 0505 02/22/21 0452 02/23/21 0436 02/24/21 0517  NA 133* 131* 134* 131* 137 137 132*  K 4.2 4.3 4.3 4.7 3.7 4.2 4.2  CL 95* 95* 95* 95* 102 98 94*  CO2 30 28 30 27 31 31 29   GLUCOSE 128* 100* 119* 371* 90 94 126*  BUN 6 6 7 10 9 6 6   CREATININE 0.46* 0.48* 0.50* 0.56* 0.40* 0.47* 0.47*  CALCIUM 8.3* 8.0* 8.3* 8.0* 7.9* 8.0* 8.3*  MG 1.6* 1.7  --  1.8  --   --  1.8   GFR: Estimated Creatinine Clearance: 124.9 mL/min (A) (by C-G formula based on SCr of 0.47 mg/dL (L)). Liver Function Tests: No results for input(s): AST, ALT, ALKPHOS, BILITOT, PROT, ALBUMIN in the last 168 hours. No results for input(s): LIPASE, AMYLASE in the last 168 hours. No results for input(s): AMMONIA in the last 168 hours. Coagulation Profile: No results for  input(s): INR, PROTIME in the last 168 hours. Cardiac Enzymes: No results for input(s): CKTOTAL, CKMB, CKMBINDEX, TROPONINI in the last 168 hours. BNP (last 3 results) No results for input(s): PROBNP in the last 8760 hours. HbA1C: No results for input(s): HGBA1C in the last 72 hours. CBG: Recent Labs  Lab 02/23/21 1623 02/23/21 1642 02/23/21 1702 02/23/21 1958 02/24/21 0809  GLUCAP 68* 66* 100* 167* 198*   Lipid Profile: No results for input(s): CHOL, HDL, LDLCALC, TRIG, CHOLHDL, LDLDIRECT in the last 72 hours. Thyroid Function Tests: No results for input(s): TSH, T4TOTAL, FREET4, T3FREE, THYROIDAB in the last 72 hours. Anemia Panel: No results for input(s): VITAMINB12, FOLATE, FERRITIN, TIBC, IRON, RETICCTPCT in the last 72 hours.  Sepsis Labs: No results for input(s): PROCALCITON, LATICACIDVEN in the last 168 hours.  Recent Results (from the past 240 hour(s))  Aerobic/Anaerobic Culture w Gram Stain (surgical/deep wound)     Status: None   Collection Time: 02/15/21  5:02 PM   Specimen: Abscess  Result  Value Ref Range Status   Specimen Description   Final    ABSCESS LEFT SHOULDER Performed at [facility], [address] [place], [place] [address]    Special Requests   Final    NONE Performed at [facility], [address] [place], [place] [address]    Gram Stain NO WBC SEEN NO ORGANISMS SEEN   Final   Culture   Final    FEW STAPHYLOCOCCUS AUREUS SUSCEPTIBILITIES PERFORMED ON PREVIOUS CULTURE WITHIN THE LAST 5 DAYS. NO ANAEROBES ISOLATED Performed at [facility], [address] [place], [place] [address]    Report Status 02/21/2021 FINAL  Final  Aerobic/Anaerobic Culture w Gram Stain (surgical/deep wound)     Status: None   Collection Time: 02/15/21  5:06 PM   Specimen: Abscess  Result Value Ref Range Status   Specimen Description   Final    ABSCESS POST US GUIDED LEFT SUPRAVICULAR ABSCESS DRAIN PLCEMENT Performed at [facility], [address] [place], [place] [address]    Special Requests   Final    NONE Performed at [facility], [address] [place], [place] [address]    Gram Stain   Final    FEW WBC PRESENT,BOTH PMN AND MONONUCLEAR FEW GRAM POSITIVE COCCI IN PAIRS    Culture   Final    ABUNDANT STAPHYLOCOCCUS AUREUS NO ANAEROBES ISOLATED Performed at [facility], [address] [place], [place] [address]    Report Status 02/21/2021 FINAL  Final   Organism ID, Bacteria STAPHYLOCOCCUS AUREUS  Final      Susceptibility   Staphylococcus aureus - MIC*    CIPROFLOXACIN >=8 RESISTANT Resistant     ERYTHROMYCIN >=8 RESISTANT Resistant     GENTAMICIN <=0.5 SENSITIVE Sensitive     OXACILLIN 0.5 SENSITIVE Sensitive     TETRACYCLINE <=1 SENSITIVE Sensitive     VANCOMYCIN <=0.5 SENSITIVE Sensitive     TRIMETH/SULFA <=10 SENSITIVE Sensitive     CLINDAMYCIN <=0.25 SENSITIVE Sensitive     RIFAMPIN <=0.5 SENSITIVE Sensitive     Inducible Clindamycin NEGATIVE Sensitive     *  ABUNDANT STAPHYLOCOCCUS AUREUS         Radiology Studies: CT ABDOMEN PELVIS W CONTRAST  Result Date: 02/23/2021 CLINICAL DATA:  Retroperitoneal abscesses EXAM: CT ABDOMEN AND PELVIS WITH CONTRAST TECHNIQUE: Multidetector CT imaging of the abdomen and pelvis was performed using the standard protocol following bolus administration of intravenous  contrast. CONTRAST:  OMNIPAQUE IOHEXOL 300 MG/ML  SOLN COMPARISON:  02/18/2021, 02/01/2021 FINDINGS: Lower chest: Bibasilar bandlike opacities compatible with atelectasis. Persistent 10 mm lingula nodule, image 30/6. Trace pleural effusions bilaterally. Normal heart size. No pericardial effusion. Left inferolateral chest open surgical wound from incision and debridement. Left inferior axillary prominent lymph nodes, which are partially imaged, suspect reactive adenopathy. Left posterior chest wall intramuscular thin curvilinear fluid collection measures 12 cm in width but only 1.7 cm in thickness compatible with a residual posterior chest wall fluid collection/abscess. This does appear to extend to the open surgical wound superiorly. Degenerative changes noted of the spine. Hepatobiliary: Focal fatty infiltration of the liver along the falciform ligament anteriorly. No biliary dilatation. Gallbladder unremarkable. Common bile duct nondilated. Pancreas: Unremarkable. No pancreatic ductal dilatation or surrounding inflammatory changes. Spleen: Normal in size without focal abnormality. Adrenals/Urinary Tract: Adrenal glands are unremarkable. Kidneys are normal, without renal calculi, focal lesion, or hydronephrosis. Bladder is unremarkable. Stomach/Bowel: Negative for bowel obstruction, significant dilatation, ileus, or free air. Normal appearing appendix containing air. No abdominopelvic ascites, new fluid collection, hemorrhage, or hematoma. Vascular/Lymphatic: Aortic atherosclerosis. Negative for aneurysm. No occlusive process. Mesenteric and renal vasculature  appear patent. No veno-occlusive process. Reproductive: No significant finding by CT. Other: Bilateral posterior retroperitoneal drains within the psoas musculature. No significant residual retroperitoneal abscess or fluid collection at the drain catheter sites. Stable drain catheter positions. Musculoskeletal: Diffuse body anasarca. Stable thoracolumbar degenerative changes and lower lumbar fusion hardware. No new osseous finding. IMPRESSION: Resolved bilateral psoas retroperitoneal abscesses status post percutaneous drains. Stable drain catheter positions. No new intra-abdominal or pelvic fluid collection or abscess. Left inferior axillary adenopathy presumed reactive/inflammatory. Inferolateral left chest open surgical wound from I and D. Residual left posterolateral chest wall fluid collection measuring 12 x 1.7 cm which appears to extend and communicate with the open surgical wound. Stable 10 mm lingula nodule. Electronically Signed   By: Judie Petit.  Shick M.D.   On: 02/23/2021 08:13        Scheduled Meds:  sodium chloride   Intravenous Once   acetaminophen  500 mg Oral QID   enoxaparin (LOVENOX) injection  40 mg Subcutaneous Q24H   ferrous sulfate  325 mg Oral BID WC   gabapentin  400 mg Oral TID   HYDROmorphone   Intravenous Q4H   insulin aspart  0-15 Units Subcutaneous TID WC   insulin aspart  0-5 Units Subcutaneous QHS   insulin glargine  15 Units Subcutaneous Daily   mouth rinse  15 mL Mouth Rinse BID   polyethylene glycol  17 g Oral Daily   senna-docusate  1 tablet Oral BID   sodium chloride flush  5 mL Intracatheter Q8H   Continuous Infusions:  sodium chloride 250 mL (02/22/21 1738)    ceFAZolin (ANCEF) IV 2 g (02/24/21 0846)     LOS: 24 days   Time spent= 35 mins    Tanisia Yokley Joline Maxcy, MD [facility]  If 7PM-7AM, please contact night-coverage  02/24/2021, 10:37 AM

## 2021-02-25 DIAGNOSIS — R6520 Severe sepsis without septic shock: Secondary | ICD-10-CM | POA: Diagnosis not present

## 2021-02-25 DIAGNOSIS — A419 Sepsis, unspecified organism: Secondary | ICD-10-CM | POA: Diagnosis not present

## 2021-02-25 LAB — BASIC METABOLIC PANEL
Anion gap: 9 (ref 5–15)
BUN: 6 mg/dL (ref 6–20)
CO2: 30 mmol/L (ref 22–32)
Calcium: 8.5 mg/dL — ABNORMAL LOW (ref 8.9–10.3)
Chloride: 96 mmol/L — ABNORMAL LOW (ref 98–111)
Creatinine, Ser: 0.49 mg/dL — ABNORMAL LOW (ref 0.61–1.24)
GFR, Estimated: >60 mL/min (ref >60)
Glucose, Bld: 96 mg/dL (ref 70–99)
Potassium: 4.3 mmol/L (ref 3.5–5.1)
Sodium: 135 mmol/L (ref 135–145)

## 2021-02-25 LAB — GLUCOSE, CAPILLARY
Glucose-Capillary: 164 mg/dL — ABNORMAL HIGH (ref 70–99)
Glucose-Capillary: 96 mg/dL (ref 70–99)
Glucose-Capillary: 153 mg/dL — ABNORMAL HIGH (ref 70–99)
Glucose-Capillary: 175 mg/dL — ABNORMAL HIGH (ref 70–99)
Glucose-Capillary: 176 mg/dL — ABNORMAL HIGH (ref 70–99)

## 2021-02-25 LAB — CBC
HCT: 29.3 % — ABNORMAL LOW (ref 39.0–52.0)
Hemoglobin: 9.2 g/dL — ABNORMAL LOW (ref 13.0–17.0)
MCH: 29.2 pg (ref 26.0–34.0)
MCHC: 31.4 g/dL (ref 30.0–36.0)
MCV: 93 fL (ref 80.0–100.0)
Platelets: 724 10*3/uL — ABNORMAL HIGH (ref 150–400)
RBC: 3.15 MIL/uL — ABNORMAL LOW (ref 4.22–5.81)
RDW: 15.7 % — ABNORMAL HIGH (ref 11.5–15.5)
WBC: 10.9 10*3/uL — ABNORMAL HIGH (ref 4.0–10.5)
nRBC: 0 % (ref 0.0–0.2)

## 2021-02-25 LAB — MAGNESIUM: Magnesium: 1.8 mg/dL (ref 1.7–2.4)

## 2021-02-25 NOTE — Progress Notes (Signed)
PROGRESS NOTE    John Ware  WEX:[id number] DOB: 12-08-1965 DOA: 01/31/2021 PCP: Junie Spencer, FNP   Brief Narrative:  55 year old with history of chronic pain, HTN, DM2, COVID-positive at home 5/20 reporting of body pain, shoulder pain eventually came to the ER.  Found to have MSSA bacteremia started on IV Ancef.  Large fluid collection on the left lateral posterior chest and back was noted.  Underwent MRI T-spine and CT CAP showing large multiloculated effusion  and osteomyelitis of thoracic spine.  There was concerns for septic emboli but echo was negative for TEE performed on 6/13 showed large tricuspid vegetation.  General surgery and orthopedic consulted.  Underwent left shoulder debridement 02/08/2021.  Postop had increasing size of his abscess collection therefore general surgery performed repeat debridement with wound VAC placement.   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  Large left posterior wall and bilateral psoas muscle abscess, bilateral septic shoulder joint, osteomyelitis and discitis.  Also possible floaters but endopthalmitis was ruled out. - Left shoulder debridement 6/16, right shoulder debridement 6/17, drain placement by IR 6/24.  Status post bilateral shoulder debridement with wound VAC placement 6/29.  Pathology for necrotic lymph node -benign adipose tissue - Continue IV Ancef -ID following - PCA for pain control. Oxycodone increased to 10mg  q4hrs prn - Plans to  repeat scan on Monday and possible OR by Ortho on Wednesday.  Anemia of chronic disease.   -Oral iron supplements with bowel regimen.  Status post 2 units PRBC 6/30.  Closely monitor.   DKA.  Resolved.  Type 2 diabetes mellitus.   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   CBG within goal.  Gabapentin 400 mg 3 times daily -Due to intermittent hypoglycemia, discontinue Premeal coverage.  Lantus 50 units daily - ISS   Hyponatremia.  Resolved  Ascending aorta dilatation (HCC) - TTE on 6/10 notes mild dilation of ascending aorta, 38 mm - follow TEE results as well: normal thoracic and ascending aorta noted on 02/05/21   Anxiety - Wellbutrin and Zoloft on hold for now in setting of hyponatremia   COVID-19 infection-resolved   DVT prophylaxis: Lovenox Code Status: Full Family Communication:    Status is: Inpatient  Remains inpatient appropriate because:Inpatient level of care appropriate due to severity of illness.  Maintain hospital stay until cleared by orthopedic and general surgery.  Complex surgical case with multiple wounds requiring debridement  Dispo: The patient is from: Home              Anticipated d/c is to: SNF              Patient currently is not medically stable to d/c.   Difficult to place patient No     Body mass index is 30.78 kg/m.  Pressure Injury 01/31/21 Buttocks Right;Left;Posterior Stage 2 -  Partial thickness loss of dermis presenting as a shallow open injury with a red, pink wound bed without  slough. (Active)  01/31/21 1546  Location: Buttocks  Location Orientation: Right;Left;Posterior  Staging: Stage 2 -  Partial thickness loss of dermis presenting as a shallow open injury with a red, pink wound bed without slough.  Wound Description (Comments):   Present on Admission: Yes      Subjective: Pain well controlled no complaints  Review of Systems Otherwise negative except as per HPI, including: General: Denies  fever, chills, night sweats or unintended weight loss. Resp: Denies cough, wheezing, shortness of breath. Cardiac: Denies chest pain, palpitations, orthopnea, paroxysmal nocturnal dyspnea. GI: Denies abdominal pain, nausea, vomiting, diarrhea or constipation GU: Denies dysuria, frequency, hesitancy or incontinence MS: Denies muscle aches, joint pain or swelling Neuro: Denies headache, neurologic deficits (focal weakness, numbness, tingling), abnormal gait Psych: Denies anxiety, depression, SI/HI/AVH Skin: Denies new rashes or lesions ID: Denies sick contacts, exotic exposures, travel  Examination:  Constitutional: Not in acute distress Respiratory: Clear to auscultation bilaterally Cardiovascular: Normal sinus rhythm, no rubs Abdomen: Nontender nondistended good bowel sounds Musculoskeletal: No edema noted Skin: No rashes seen Neurologic: CN 2-12 grossly intact.  And nonfocal Psychiatric: Normal judgment and insight. Alert and oriented x 3. Normal mood. Multiple drains, wound VAC and skin dressings noted throughout his body   Objective: Vitals:   02/25/21 0204 02/25/21 0400 02/25/21 0539 02/25/21 0859  BP:   123/70   Pulse:   81   Resp: 18 18 18 14   Temp:   99.6 F (37.6 C)   TempSrc:   Oral   SpO2: 98% 99% 99%   Weight:      Height:        Intake/Output Summary (Last 24 hours) at 02/25/2021 1031 Last data filed at 02/25/2021 0901 Gross per 24 hour  Intake 240 ml  Output 2500 ml  Net -2260 ml   Filed Weights   02/05/21 0500 02/08/21 1540 02/20/21 1419  Weight: 97.3 kg 97.3 kg 97.3 kg     Data Reviewed:   CBC: Recent Labs  Lab 02/19/21 0431 02/21/21 0505 02/22/21 0452 02/23/21 0436 02/24/21 0517 02/25/21 0431  WBC 8.7 8.8 9.3 9.9 11.9* 10.9*  NEUTROABS 6.2 7.8*  --   --   --   --   HGB 7.7* 7.6* 6.3* 8.3* 9.2* 9.2*  HCT 23.5* 24.2* 19.8* 25.6* 28.9* 29.3*  MCV 92.5 94.5 93.0 90.8 92.0 93.0  PLT 630* 630* 743* 743* 823* 724*   Basic Metabolic  Panel: Recent Labs  Lab 02/19/21 0431 02/20/21 0815 02/21/21 0505 02/22/21 0452 02/23/21 0436 02/24/21 0517 02/25/21 0431  NA 131*   < > 131* 137 137 132* 135  K 4.3   < > 4.7 3.7 4.2 4.2 4.3  CL 95*   < > 95* 102 98 94* 96*  CO2 28   < > 27 31 31 29 30   GLUCOSE 100*   < > 371* 90 94 126* 96  BUN 6   < > 10 9 6 6 6   CREATININE 0.48*   < > 0.56* 0.40* 0.47* 0.47* 0.49*  CALCIUM 8.0*   < > 8.0* 7.9* 8.0* 8.3* 8.5*  MG 1.7  --  1.8  --   --  1.8 1.8   < > = values in this interval not displayed.   GFR: Estimated Creatinine Clearance: 124.9 mL/min (A) (by C-G formula based on SCr of 0.49 mg/dL (L)). Liver Function Tests: No results for input(s): AST, ALT, ALKPHOS, BILITOT, PROT, ALBUMIN in the last 168 hours. No results for input(s): LIPASE,  AMYLASE in the last 168 hours. No results for input(s): AMMONIA in the last 168 hours. Coagulation Profile: No results for input(s): INR, PROTIME in the last 168 hours. Cardiac Enzymes: No results for input(s): CKTOTAL, CKMB, CKMBINDEX, TROPONINI in the last 168 hours. BNP (last 3 results) No results for input(s): PROBNP in the last 8760 hours. HbA1C: No results for input(s): HGBA1C in the last 72 hours. CBG: Recent Labs  Lab 02/24/21 0809 02/24/21 1155 02/24/21 1656 02/24/21 2201 02/25/21 0744  GLUCAP 198* 146* 164* 143* 96   Lipid Profile: No results for input(s): CHOL, HDL, LDLCALC, TRIG, CHOLHDL, LDLDIRECT in the last 72 hours. Thyroid Function Tests: No results for input(s): TSH, T4TOTAL, FREET4, T3FREE, THYROIDAB in the last 72 hours. Anemia Panel: No results for input(s): VITAMINB12, FOLATE, FERRITIN, TIBC, IRON, RETICCTPCT in the last 72 hours.  Sepsis Labs: No results for input(s): PROCALCITON, LATICACIDVEN in the last 168 hours.  Recent Results (from the past 240 hour(s))  Aerobic/Anaerobic Culture w Gram Stain (surgical/deep wound)     Status: None   Collection Time: 02/15/21  5:02 PM   Specimen: Abscess  Result  Value Ref Range Status   Specimen Description   Final    ABSCESS LEFT SHOULDER Performed at [facility], [address] [place], [place] [address]    Special Requests   Final    NONE Performed at [facility], [address], [place], [place] [address]    Gram Stain NO WBC SEEN NO ORGANISMS SEEN   Final   Culture   Final    FEW STAPHYLOCOCCUS AUREUS SUSCEPTIBILITIES PERFORMED ON PREVIOUS CULTURE WITHIN THE LAST 5 DAYS. NO ANAEROBES ISOLATED Performed at [facility], [address], [place], [place] [address]    Report Status 02/21/2021 FINAL  Final  Aerobic/Anaerobic Culture w Gram Stain (surgical/deep wound)     Status: None   Collection Time: 02/15/21  5:06 PM   Specimen: Abscess  Result Value Ref Range Status   Specimen Description   Final    ABSCESS POST US GUIDED LEFT SUPRAVICULAR ABSCESS DRAIN PLCEMENT Performed at [facility], [address], [place], [place] [address]    Special Requests   Final    NONE Performed at [facility], [address] [place], [place] [address]    Gram Stain   Final    FEW WBC PRESENT,BOTH PMN AND MONONUCLEAR FEW GRAM POSITIVE COCCI IN PAIRS    Culture   Final    ABUNDANT STAPHYLOCOCCUS AUREUS NO ANAEROBES ISOLATED Performed at [facility], [address] [place], [place] [address]    Report Status 02/21/2021 FINAL  Final   Organism ID, Bacteria STAPHYLOCOCCUS AUREUS  Final      Susceptibility   Staphylococcus aureus - MIC*    CIPROFLOXACIN >=8 RESISTANT Resistant     ERYTHROMYCIN >=8 RESISTANT Resistant     GENTAMICIN <=0.5 SENSITIVE Sensitive     OXACILLIN 0.5 SENSITIVE Sensitive     TETRACYCLINE <=1 SENSITIVE Sensitive     VANCOMYCIN <=0.5 SENSITIVE Sensitive     TRIMETH/SULFA <=10 SENSITIVE Sensitive     CLINDAMYCIN <=0.25 SENSITIVE Sensitive     RIFAMPIN <=0.5 SENSITIVE Sensitive     Inducible Clindamycin NEGATIVE Sensitive     *  ABUNDANT STAPHYLOCOCCUS AUREUS         Radiology Studies: No results found.      Scheduled Meds:  sodium chloride   Intravenous Once   acetaminophen  500 mg Oral QID   enoxaparin (LOVENOX) injection  40 mg Subcutaneous Q24H   ferrous sulfate  325 mg Oral BID WC   gabapentin  400 mg Oral TID   HYDROmorphone   Intravenous Q4H   insulin aspart  0-15 Units Subcutaneous TID WC   insulin aspart  0-5 Units Subcutaneous QHS   insulin glargine  15 Units Subcutaneous Daily   mouth rinse  15 mL Mouth Rinse BID   polyethylene glycol  17 g Oral Daily   senna-docusate  1 tablet Oral BID   sodium chloride flush  5 mL Intracatheter Q8H   Continuous Infusions:  sodium chloride 250 mL (02/22/21 1738)    ceFAZolin (ANCEF) IV 2 g (02/25/21 0950)     LOS: 25 days   Time spent= 35 mins    Facundo Allemand Joline Maxcy, MD [facility]  If 7PM-7AM, please contact night-coverage  02/25/2021, 10:31 AM

## 2021-02-25 NOTE — Progress Notes (Signed)
The patient complained of sliding scale. Pt expressed concerns over cbg's being too low. Pt is not agreeable to taking sliding scale coverage at this time.

## 2021-02-25 NOTE — Progress Notes (Signed)
[facility] Progress Note (Weekend Coverage)   Contacted about loss of suction/seal to R shoulder wound vac  Evaluated pt at bedside   Removed all of the drape from the wound vac. Maintained sponge Picture framed the borders of the wound then applied vac drape over the sponge New hose applied Vac turned back on with excellent seal   Continue care as per Dr. Everardo Pacific  Anticipate repeat I&D on Wednesday   Mearl Latin, PA-C [phone] Salena Saner) 02/25/2021, 5:22 PM  [facility] [address] [place] [address] [phone] Collier Bullock (F)

## 2021-02-25 NOTE — Progress Notes (Signed)
Left lateral chest dressing changed x2 on day shift.

## 2021-02-26 ENCOUNTER — Inpatient Hospital Stay (HOSPITAL_COMMUNITY): Payer: Commercial Managed Care - PPO

## 2021-02-26 DIAGNOSIS — A419 Sepsis, unspecified organism: Secondary | ICD-10-CM | POA: Diagnosis not present

## 2021-02-26 DIAGNOSIS — R6520 Severe sepsis without septic shock: Secondary | ICD-10-CM | POA: Diagnosis not present

## 2021-02-26 LAB — BASIC METABOLIC PANEL
Anion gap: 9 (ref 5–15)
BUN: 6 mg/dL (ref 6–20)
CO2: 30 mmol/L (ref 22–32)
Calcium: 8.7 mg/dL — ABNORMAL LOW (ref 8.9–10.3)
Chloride: 93 mmol/L — ABNORMAL LOW (ref 98–111)
Creatinine, Ser: 0.46 mg/dL — ABNORMAL LOW (ref 0.61–1.24)
GFR, Estimated: >60 mL/min (ref >60)
Glucose, Bld: 159 mg/dL — ABNORMAL HIGH (ref 70–99)
Potassium: 4.2 mmol/L (ref 3.5–5.1)
Sodium: 132 mmol/L — ABNORMAL LOW (ref 135–145)

## 2021-02-26 LAB — CBC
HCT: 31.1 % — ABNORMAL LOW (ref 39.0–52.0)
Hemoglobin: 9.8 g/dL — ABNORMAL LOW (ref 13.0–17.0)
MCH: 29 pg (ref 26.0–34.0)
MCHC: 31.5 g/dL (ref 30.0–36.0)
MCV: 92 fL (ref 80.0–100.0)
Platelets: 844 10*3/uL — ABNORMAL HIGH (ref 150–400)
RBC: 3.38 MIL/uL — ABNORMAL LOW (ref 4.22–5.81)
RDW: 15.2 % (ref 11.5–15.5)
WBC: 10.5 10*3/uL (ref 4.0–10.5)
nRBC: 0 % (ref 0.0–0.2)

## 2021-02-26 LAB — MAGNESIUM: Magnesium: 1.8 mg/dL (ref 1.7–2.4)

## 2021-02-26 LAB — GLUCOSE, CAPILLARY
Glucose-Capillary: 202 mg/dL — ABNORMAL HIGH (ref 70–99)
Glucose-Capillary: 231 mg/dL — ABNORMAL HIGH (ref 70–99)
Glucose-Capillary: 158 mg/dL — ABNORMAL HIGH (ref 70–99)
Glucose-Capillary: 160 mg/dL — ABNORMAL HIGH (ref 70–99)

## 2021-02-26 MED ORDER — IOHEXOL 300 MG/ML  SOLN
75.0000 mL | Freq: Once | INTRAMUSCULAR | Status: AC | PRN
  Administered 2021-02-26: 75 mL via INTRAVENOUS

## 2021-02-26 MED ORDER — SODIUM CHLORIDE (PF) 0.9 % IJ SOLN
INTRAMUSCULAR | Status: AC
  Filled 2021-02-26: qty 50

## 2021-02-26 NOTE — Progress Notes (Signed)
Physical Therapy Treatment Patient Details Name: John Ware MRN: [id number] DOB: 11/13/1965 Today's Date: 02/26/2021    History of Present Illness 55 year old with history of chronic pain, HTN, DM2, COVID-positive at home 5/20 reporting of body pain, shoulder pain eventually came to the ER.  Found to have MSSA bacteremia started on IV Ancef.  Large fluid collection on the left lateral posterior chest and back was noted.  Underwent MRI T-spine and CT CAP showing large multiloculated effusion  and osteomyelitis of thoracic spine.  There was concerns for septic emboli but echo was negative for TEE performed on 6/13 showed large tricuspid vegetation.  General surgery and orthopedic consulted.  Underwent left shoulder debridement 02/08/2021.  Postop had increasing size of his abscess collection therefore general surgery performed repeat debridement with wound VAC placement.  Dressing management per surgical team.    PT Comments    Pt's spouse and mother present during session. Assisted with amb a great distance in hallway.  General transfer comment: Pt pushing IV pole with 1 hand without loss of balance noted. assist with safety managing multiple lines. General Gait Details: cues for posture as pt continues to keep RW too far forward.  Assist with safety managing multiple lines. Pt continues to progress with his mobility.  General Comments: AxO x 3 following all commands but still with need of intermittent redirection to focus on task at hand.  Easily distracted and can get frustrated  Follow Up Recommendations  SNF     Equipment Recommendations  Rolling walker with 5" wheels    Recommendations for Other Services       Precautions / Restrictions Precautions Precautions: Fall Precaution Comments: B UE slings for comfort but has NOT wore them > week, Bilateral wound VACs Required Braces or Orthoses: Sling Restrictions Weight Bearing Restrictions: No Other Position/Activity Restrictions:  per orders 6/18 patient is WBAT B UEs    Mobility  Bed Mobility               General bed mobility comments: OOB in recliner    Transfers Overall transfer level: Needs assistance Equipment used: None Transfers: Sit to/from Stand Sit to Stand: Supervision Stand pivot transfers: Supervision       General transfer comment: Pt pushing IV pole with 1 hand without loss of balance noted. assist with safety managing multiple lines.  Ambulation/Gait Ambulation/Gait assistance: Supervision Gait Distance (Feet): 500 Feet Assistive device: Rolling walker (2 wheeled) Gait Pattern/deviations: Step-through pattern;Decreased stride length;Trunk flexed Gait velocity: decr   General Gait Details: cues for posture as pt continues to keep RW too far forward.  Assist with safety managing multiple lines.   Stairs             Wheelchair Mobility    Modified Rankin (Stroke Patients Only)       Balance     Sitting balance-[name]: Good Sitting balance - Comments: At edge of chair   Standing balance support: During functional activity Standing balance-[name]: Fair Standing balance comment: now mobilizing by puhing IV pole with 1 hand                            Cognition Arousal/Alertness: Awake/alert Behavior During Therapy: WFL for tasks assessed/performed;Restless Overall Cognitive Status: Within Functional Limits for tasks assessed  General Comments: AxO x 3 following all commands but still with need of intermittent redirection to focus on task at hand.  Easily distracted and can get frustrated      Exercises Other Exercises Other Exercises: Pt was encouraged to work with OT on supine P/AAROM of shoulders to promote improved gh gliding as pt lifting each arm at shoulder primarily usng compensatory scaption. Pt declined however due to recent increased back pain at CT scan today. Asked to try on a  different date.    General Comments        Pertinent Vitals/Pain Pain Assessment: Faces Pain Score: 5  Faces Pain Scale: Hurts a little bit Pain Location: back Pain Descriptors / Indicators: Sore;Grimacing Pain Intervention(s): Monitored during session;Repositioned    Home Living                      Prior Function            PT Goals (current goals can now be found in the care plan section) Acute Rehab PT Goals Patient Stated Goal: to go home at discharge Progress towards PT goals: Progressing toward goals    Frequency    Min 3X/week      PT Plan      Co-evaluation              AM-PAC PT "6 Clicks" Mobility   Outcome Measure  Help needed turning from your back to your side while in a flat bed without using bedrails?: A Little Help needed moving from lying on your back to sitting on the side of a flat bed without using bedrails?: A Little Help needed moving to and from a bed to a chair (including a wheelchair)?: A Little Help needed standing up from a chair using your arms (e.g., wheelchair or bedside chair)?: A Little Help needed to walk in hospital room?: A Little Help needed climbing 3-5 steps with a railing? : A Lot 6 Click Score: 17    End of Session Equipment Utilized During Treatment: Gait belt Activity Tolerance: Patient tolerated treatment well Patient left: in chair;with call bell/phone within reach;with chair alarm set Nurse Communication: Mobility status PT Visit Diagnosis: Unsteadiness on feet (R26.81);History of falling (Z91.81);Difficulty in walking, not elsewhere classified (R26.2)     Time: 1435-1500 PT Time Calculation (min) (ACUTE ONLY): 25 min  Charges:  $Gait Training: 23-37 mins                     Felecia Shelling  PTA Acute  Rehabilitation Services Pager      [phone] Office      [phone]

## 2021-02-26 NOTE — Progress Notes (Signed)
Patient has not followed mobility recommendations.Pt pulled on left shoulder wound vac. Will attempt to seal.

## 2021-02-26 NOTE — Progress Notes (Signed)
PROGRESS NOTE    John Ware  BDZ:[id number] DOB: 06/10/1966 DOA: 01/31/2021 PCP: Junie Spencer, FNP   Brief Narrative:  55 year old with history of chronic pain, HTN, DM2, COVID-positive at home 5/20 reporting of body pain, shoulder pain eventually came to the ER.  Found to have MSSA bacteremia started on IV Ancef.  Large fluid collection on the left lateral posterior chest and back was noted.  Underwent MRI T-spine and CT CAP showing large multiloculated effusion  and osteomyelitis of thoracic spine.  There was concerns for septic emboli but echo was negative for TEE performed on 6/13 showed large tricuspid vegetation.  General surgery and orthopedic consulted.  Underwent left shoulder debridement 02/08/2021.  Postop had increasing size of his abscess collection therefore general surgery performed repeat debridement with wound VAC placement.  Dressing management per surgical team.   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  Large left posterior wall and bilateral psoas muscle abscess, bilateral septic shoulder joint, osteomyelitis and discitis.  Also possible floaters but endopthalmitis was ruled out. - Left shoulder debridement 6/16, right shoulder debridement 6/17, drain placement by IR 6/24.  Status post bilateral shoulder debridement with wound VAC placement 6/29.  Pathology for necrotic lymph node -benign adipose tissue - Continue IV Ancef -ID following - PCA for pain control. Oxycodone  increased to 10mg  q4hrs prn - Status post CT chest and shoulder today-results pending anticipate I&D by Ortho on Wednesday  Anemia of chronic disease.   -Oral iron supplements with bowel regimen.  Status post 2 units PRBC 6/30.  Closely monitor.   DKA.  Resolved.  Type 2 diabetes mellitus.   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   CBG within goal.  Gabapentin 400 mg 3 times daily -Hold off on Premeal coverage.  Lantus 50 units daily. - ISS   Ascending aorta dilatation (HCC) - TTE on 6/10 notes mild dilation of ascending aorta, 38 mm - follow TEE results as well: normal thoracic and ascending aorta noted on 02/05/21   Anxiety - Wellbutrin and Zoloft on hold for now in setting of hyponatremia   COVID-19 infection-resolved   DVT prophylaxis: Lovenox Code Status: Full Family Communication:    Status is: Inpatient  Remains inpatient appropriate because:Inpatient level of care appropriate due to severity of illness.  Maintain hospital stay until cleared by orthopedic and general surgery.  Complex surgical case with multiple wounds requiring debridement  Dispo: The patient is from: Home              Anticipated d/c is to: SNF              Patient currently is not medically stable to d/c.   Difficult to place patient No     Body mass index is 30.78 kg/m.  Pressure Injury 01/31/21 Buttocks Right;Left;Posterior Stage 2 -  Partial thickness loss of dermis presenting as a shallow open injury with a red,  pink wound bed without slough. (Active)  01/31/21 1546  Location: Buttocks  Location Orientation: Right;Left;Posterior  Staging: Stage 2 -  Partial thickness loss of dermis presenting as a shallow open injury with a red, pink wound bed without slough.  Wound Description (Comments):   Present on Admission: Yes      Subjective: No new complaints for now  Review of Systems Otherwise negative except as per HPI,  including: General: Denies fever, chills, night sweats or unintended weight loss. Resp: Denies cough, wheezing, shortness of breath. Cardiac: Denies chest pain, palpitations, orthopnea, paroxysmal nocturnal dyspnea. GI: Denies abdominal pain, nausea, vomiting, diarrhea or constipation GU: Denies dysuria, frequency, hesitancy or incontinence MS: Denies muscle aches, joint pain or swelling Neuro: Denies headache, neurologic deficits (focal weakness, numbness, tingling), abnormal gait Psych: Denies anxiety, depression, SI/HI/AVH Skin: Denies new rashes or lesions ID: Denies sick contacts, exotic exposures, travel  Examination: Constitutional: Not in acute distress Respiratory: Clear to auscultation bilaterally Cardiovascular: Normal sinus rhythm, no rubs Abdomen: Nontender nondistended good bowel sounds Musculoskeletal: No edema noted Skin: No rashes seen Neurologic: CN 2-12 grossly intact.  And nonfocal Psychiatric: Normal judgment and insight. Alert and oriented x 3. Normal mood.  Multiple drains, wound VAC and skin dressings noted throughout his body   Objective: Vitals:   02/25/21 2048 02/25/21 2307 02/26/21 0434 02/26/21 0509  BP:   119/79   Pulse:   86   Resp: 13 15 20 14   Temp:   98.9 F (37.2 C)   TempSrc:      SpO2: 97% 98% 100% 100%  Weight:      Height:        Intake/Output Summary (Last 24 hours) at 02/26/2021 0810 Last data filed at 02/26/2021 0400 Gross per 24 hour  Intake 240 ml  Output 2510 ml  Net -2270 ml   Filed Weights   02/05/21 0500 02/08/21 1540 02/20/21 1419  Weight: 97.3 kg 97.3 kg 97.3 kg     Data Reviewed:   CBC: Recent Labs  Lab 02/21/21 0505 02/22/21 0452 02/23/21 0436 02/24/21 0517 02/25/21 0431 02/26/21 0501  WBC 8.8 9.3 9.9 11.9* 10.9* 10.5  NEUTROABS 7.8*  --   --   --   --   --   HGB 7.6* 6.3* 8.3* 9.2* 9.2* 9.8*  HCT 24.2* 19.8* 25.6* 28.9* 29.3* 31.1*  MCV 94.5 93.0 90.8 92.0 93.0 92.0  PLT 630* 743* 743* 823* 724* 844*    Basic Metabolic Panel: Recent Labs  Lab 02/21/21 0505 02/22/21 0452 02/23/21 0436 02/24/21 0517 02/25/21 0431 02/26/21 0501  NA 131* 137 137 132* 135 132*  K 4.7 3.7 4.2 4.2 4.3 4.2  CL 95* 102 98 94* 96* 93*  CO2 27 31 31 29 30 30   GLUCOSE 371* 90 94 126* 96 159*  BUN 10 9 6 6 6 6   CREATININE 0.56* 0.40* 0.47* 0.47* 0.49* 0.46*  CALCIUM 8.0* 7.9* 8.0* 8.3* 8.5* 8.7*  MG 1.8  --   --  1.8 1.8 1.8   GFR: Estimated Creatinine Clearance: 124.9 mL/min (A) (by C-G formula based on SCr of 0.46 mg/dL (L)). Liver Function Tests: No results for input(s): AST, ALT, ALKPHOS, BILITOT, PROT, ALBUMIN in the last 168 hours. No results for input(s): LIPASE, AMYLASE in the last 168 hours. No results for input(s): AMMONIA in the last 168 hours. Coagulation Profile: No results for input(s): INR, PROTIME in the last 168 hours. Cardiac Enzymes: No results for input(s): CKTOTAL, CKMB, CKMBINDEX, TROPONINI in the last  168 hours. BNP (last 3 results) No results for input(s): PROBNP in the last 8760 hours. HbA1C: No results for input(s): HGBA1C in the last 72 hours. CBG: Recent Labs  Lab 02/25/21 0744 02/25/21 1033 02/25/21 1620 02/25/21 2304 02/26/21 0746  GLUCAP 96 153* 175* 176* 202*   Lipid Profile: No results for input(s): CHOL, HDL, LDLCALC, TRIG, CHOLHDL, LDLDIRECT in the last 72 hours. Thyroid Function Tests: No results for input(s): TSH, T4TOTAL, FREET4, T3FREE, THYROIDAB in the last 72 hours. Anemia Panel: No results for input(s): VITAMINB12, FOLATE, FERRITIN, TIBC, IRON, RETICCTPCT in the last 72 hours.  Sepsis Labs: No results for input(s): PROCALCITON, LATICACIDVEN in the last 168 hours.  No results found for this or any previous visit (from the past 240 hour(s)).        Radiology Studies: No results found.      Scheduled Meds:  sodium chloride   Intravenous Once   acetaminophen  500 mg Oral QID   enoxaparin (LOVENOX) injection  40 mg Subcutaneous Q24H    ferrous sulfate  325 mg Oral BID WC   gabapentin  400 mg Oral TID   HYDROmorphone   Intravenous Q4H   insulin aspart  0-15 Units Subcutaneous TID WC   insulin aspart  0-5 Units Subcutaneous QHS   insulin glargine  15 Units Subcutaneous Daily   mouth rinse  15 mL Mouth Rinse BID   polyethylene glycol  17 g Oral Daily   senna-docusate  1 tablet Oral BID   sodium chloride flush  5 mL Intracatheter Q8H   Continuous Infusions:  sodium chloride 250 mL (02/22/21 1738)    ceFAZolin (ANCEF) IV 2 g (02/25/21 2314)     LOS: 26 days   Time spent= 35 mins    Margaretta Chittum Joline Maxcy, MD [facility]  If 7PM-7AM, please contact night-coverage  02/26/2021, 8:10 AM

## 2021-02-26 NOTE — Progress Notes (Signed)
On am assessment wound vac was alarming. Canister has been changed. Pt was encouraged to call for assistance when getting out of bed/chair to assist in line care. Discussed the importance of wound vac and patent lines. Pt disconnected tubing connected to right wound vac. Suction reapplied. Pt has continued to get up without assistance. Additional education provided, will continue to monitor.

## 2021-02-26 NOTE — Progress Notes (Addendum)
Orthopaedic Trauma Service Note (weekend Coverage)  RN contacted me this afternoon stating that the seal on the L shoulder was now gone.  Pt got up out of bed and pulled hose which disrupted seal.  R shoulder vac which I addressed yesterday is still with good seal.    All vac drape from L shoulder was removed.  Obvious purulence present and + odor. + erythema to wound edges  Incision picture framed with vac drape and then applied new vac drape over sponge.  New suction hose applied and VAC turned back on w/o difficulty.  Good seal achieved   Reviewed scans from early this am  + fluid collections persist to both shoulder.  L shoulder with more pathology than R  Interestingly the chest CT shows possible periapical lucencies around the mandibular teeth.   Pt with bacteremia, multifocal abscesses, B septic glenohumeral arthritis/deep abscesses and exact etiology seems to be uncertain. Will order maxillofacial CT to evaluate for possible Dental abscesses as source.  Discussed with primary team   Looks like pt is on schedule for repeat I&Ds of B shoulders on 7/6  Mearl Latin, PA-C [phone] (C) 02/26/2021, 4:20 PM  [facility] [address] [place] [address] [phone] (F)

## 2021-02-26 NOTE — Progress Notes (Signed)
Occupational Therapy Treatment Patient Details Name: John Ware MRN: [id number] DOB: 1966-01-11 Today's Date: 02/26/2021    History of present illness Patient is a 55 y.o. male presented to ED with weakness and per wife generally unwell over the past 3 weeks. He had a positive home COVID test on 01/12/21. He had no respiratory symptoms at the time. He just had fevers that were responsive to APAP. He self isolated at home until 01/20/21 when he was at a party and experienced a fall. bil shoulder and spine xrays negative.  MRI brain=Punctate acute or subacute infarct versus artifact in the high left  frontal lobe. Otherwise, no evidence of recent infarction.  Underwent bilateral MRIs of his shoulders and he is still having fevers.  At the left shoulder was concerned for septic arthritis and left axillary abscess.  Orthopedics was reconsulted as well as IR.  Pt s/p  Left shoulder excisional debridement, deep abscess, with arthrotomy and irrigation and debridement of the joint with biceps tenotomy and excision of necrotic rotator cuff on 02/08/21. Patient s/p Right shoulder excisional debridement, including biceps tenolysis, Evacuation of purulent abscess, right subacromial space, as well as subcoracoid bursa, Right shoulder arthrotomy with irrigation and debridement of joint, Right shoulder open distal clavicle resection on 6/18.  Pt also s/p Incision and drainage of multiple left lateral chest wall abscesses along with excisional debridement on 6/18. PMH: significant of chronic pain, HTN, DM2, depression, spinal stenosis. Second I&D surgeries to bilateral shoulders and left axilla area 6/28. Wound vacs placed on bilateral shoulders.   OT comments  Patient progressing and showed improved ability to stand for greater period of time for grooming and sink with unilateral UE support as needed, compared to previous session. Patient remains limited by back pain, decreased attention to task, and reluctance to work  on shoulder flexibility and glide, along with deficits noted below. Pt continues to demonstrate good rehab potential and would benefit from continued skilled OT to increase safety and independence with ADLs and functional transfers to allow pt to return home safely and reduce caregiver burden and fall risk.   Follow Up Recommendations  Home health OT    Equipment Recommendations  3 in 1 bedside commode    Recommendations for Other Services      Precautions / Restrictions Precautions Precautions: Fall Precaution Comments: B UE slings for comfort, Bilateral Rotator Cuff Arthopathy Restrictions Weight Bearing Restrictions: No Other Position/Activity Restrictions: per orders 6/18 patient is WBAT B UEs       Mobility Bed Mobility               General bed mobility comments: Pt in recliner    Transfers Overall transfer level: Needs assistance Equipment used:  (IV Pole. Pt reports that he has not been using the RW lately) Transfers: Sit to/from Stand Sit to Stand: Supervision Stand pivot transfers: Supervision       General transfer comment: Supervision for lines. Pt pushing IV pole with 1 hand without loss of balance noted.    Balance     Sitting balance-[name]: Good Sitting balance - Comments: At edge of chair   Standing balance support: During functional activity Standing balance-[name]: Fair Standing balance comment: now mobilizing by puhing IV pole with 1 hand                           ADL either performed or assessed with clinical judgement   ADL  Grooming: Standing;Oral care;Wash/dry face;Wash/dry hands;Supervision/safety Grooming Details (indicate cue type and reason): Pt stood for grooming tasks with one hand on sink for steadying and assist only to pick up dropped items from floor.                             Functional mobility during ADLs: Supervision/safety       Vision Patient Visual Report: No change  from baseline Vision Assessment?: No apparent visual deficits   Perception     Praxis      Cognition Arousal/Alertness: Awake/alert Behavior During Therapy: WFL for tasks assessed/performed;Restless Overall Cognitive Status: Within Functional Limits for tasks assessed                                 General Comments: AxO x 3 following all commands but still with need of intermittent redirection to focus on task at hand.        Exercises Other Exercises Other Exercises: Pt was encouraged to work with OT on supine P/AAROM of shoulders to promote improved gh gliding as pt lifting each arm at shoulder primarily usng compensatory scaption. Pt declined however due to recent increased back pain at CT scan today. Asked to try on a different date.   Shoulder Instructions       General Comments      Pertinent Vitals/ Pain       Pain Assessment: 0-10 Pain Score: 5  Pain Location: lower back. Pt reports CT scan this morning and back pain worse from "that hard bed". Pain Intervention(s): Limited activity within patient's tolerance;Monitored during session;PCA encouraged;Other (comment) (Pt using PCA throughout session.)  Home Living                                          Prior Functioning/Environment              Frequency  Min 2X/week        Progress Toward Goals  OT Goals(current goals can now be found in the care plan section)  Progress towards OT goals: Goals met and updated - see care plan  Acute Rehab OT Goals Patient Stated Goal: to go home at discharge OT Goal Formulation: With patient Time For Goal Achievement: 03/12/21 Potential to Achieve Goals: Good ADL Goals Pt Will Perform Grooming: with modified independence Pt Will Transfer to Toilet: with modified independence Pt Will Perform Toileting - Clothing Manipulation and hygiene: with modified independence  Plan Discharge plan remains appropriate    Co-evaluation                  AM-PAC OT "6 Clicks" Daily Activity     Outcome Measure   Help from another person eating meals?: A Little Help from another person taking care of personal grooming?: A Little Help from another person toileting, which includes using toliet, bedpan, or urinal?: A Lot Help from another person bathing (including washing, rinsing, drying)?: A Lot Help from another person to put on and taking off regular upper body clothing?: A Lot (due to lines) Help from another person to put on and taking off regular lower body clothing?: A Lot 6 Click Score: 14    End of Session Equipment Utilized During Treatment:  (IV pole)  OT Visit Diagnosis: Unsteadiness on feet (  R26.81);Other abnormalities of gait and mobility (R26.89);History of falling (Z91.81);Muscle weakness (generalized) (M62.81);Pain Pain - part of body:  (back)   Activity Tolerance Patient tolerated treatment well   Patient Left in chair;with call bell/phone within reach (Seated on window bench)   Nurse Communication  (IV and wound VAC beeping)        Time: 0102-7253 OT Time Calculation (min): 32 min  Charges: OT General Charges $OT Visit: 1 Visit OT Treatments $Self Care/Home Management : 8-22 mins $Therapeutic Activity: 8-22 mins  Anderson [place], [place] Office: [phone] 02/26/2021   Julien Girt 02/26/2021, 12:39 PM

## 2021-02-26 NOTE — H&P (View-Only) (Signed)
Orthopaedic Trauma Service Note (weekend Coverage)  RN contacted me this afternoon stating that the seal on the L shoulder was now gone.  Pt got up out of bed and pulled hose which disrupted seal.  R shoulder vac which I addressed yesterday is still with good seal.    All vac drape from L shoulder was removed.  Obvious purulence present and + odor. + erythema to wound edges  Incision picture framed with vac drape and then applied new vac drape over sponge.  New suction hose applied and VAC turned back on w/o difficulty.  Good seal achieved   Reviewed scans from early this am  + fluid collections persist to both shoulder.  L shoulder with more pathology than R  Interestingly the chest CT shows possible periapical lucencies around the mandibular teeth.   Pt with bacteremia, multifocal abscesses, B septic glenohumeral arthritis/deep abscesses and exact etiology seems to be uncertain. Will order maxillofacial CT to evaluate for possible Dental abscesses as source.  Discussed with primary team   Looks like pt is on schedule for repeat I&Ds of B shoulders on 7/6  Ausha Sieh W. Kaysi Ourada, PA-C [phone] (C) 02/26/2021, 4:20 PM  [facility] [address] [place] [address] [phone] (O) [phone] (F)     

## 2021-02-27 DIAGNOSIS — R6520 Severe sepsis without septic shock: Secondary | ICD-10-CM | POA: Diagnosis not present

## 2021-02-27 DIAGNOSIS — A419 Sepsis, unspecified organism: Secondary | ICD-10-CM | POA: Diagnosis not present

## 2021-02-27 LAB — GLUCOSE, CAPILLARY
Glucose-Capillary: 151 mg/dL — ABNORMAL HIGH (ref 70–99)
Glucose-Capillary: 287 mg/dL — ABNORMAL HIGH (ref 70–99)
Glucose-Capillary: 79 mg/dL (ref 70–99)
Glucose-Capillary: 159 mg/dL — ABNORMAL HIGH (ref 70–99)

## 2021-02-27 MED ORDER — JUVEN PO PACK
1.0000 | PACK | Freq: Two times a day (BID) | ORAL | Status: DC
  Administered 2021-02-27 – 2021-03-15 (×24): 1 via ORAL
  Filled 2021-02-27 (×34): qty 1

## 2021-02-27 MED ORDER — INSULIN GLARGINE 100 UNIT/ML ~~LOC~~ SOLN
20.0000 [IU] | Freq: Every day | SUBCUTANEOUS | Status: DC
  Administered 2021-02-28 – 2021-03-05 (×6): 20 [IU] via SUBCUTANEOUS
  Filled 2021-02-27 (×8): qty 0.2

## 2021-02-27 MED ORDER — POVIDONE-IODINE 10 % EX SWAB
2.0000 "application " | Freq: Once | CUTANEOUS | Status: AC
  Administered 2021-02-28: 2 via TOPICAL

## 2021-02-27 MED ORDER — ZINC SULFATE 220 (50 ZN) MG PO CAPS
220.0000 mg | ORAL_CAPSULE | Freq: Every day | ORAL | Status: DC
  Administered 2021-02-27 – 2021-03-15 (×13): 220 mg via ORAL
  Filled 2021-02-27 (×14): qty 1

## 2021-02-27 MED ORDER — CHLORHEXIDINE GLUCONATE 4 % EX LIQD
60.0000 mL | Freq: Once | CUTANEOUS | Status: AC
  Administered 2021-02-28: 4 via TOPICAL
  Filled 2021-02-27: qty 60

## 2021-02-27 MED ORDER — PROSOURCE PLUS PO LIQD
30.0000 mL | Freq: Three times a day (TID) | ORAL | Status: DC
  Administered 2021-02-27 – 2021-03-09 (×22): 30 mL via ORAL
  Filled 2021-02-27 (×22): qty 30

## 2021-02-27 MED ORDER — ACETAMINOPHEN 500 MG PO TABS
1000.0000 mg | ORAL_TABLET | Freq: Once | ORAL | Status: AC
  Administered 2021-02-28: 1000 mg via ORAL
  Filled 2021-02-27: qty 2

## 2021-02-27 MED ORDER — ASCORBIC ACID 500 MG PO TABS
500.0000 mg | ORAL_TABLET | Freq: Two times a day (BID) | ORAL | Status: DC
  Administered 2021-02-27 – 2021-03-15 (×29): 500 mg via ORAL
  Filled 2021-02-27 (×29): qty 1

## 2021-02-27 NOTE — Progress Notes (Signed)
Initial Nutrition Assessment  DOCUMENTATION CODES:   Not applicable  INTERVENTION:  - will order 30 ml Prosource Plus TID, each supplement provides 100 kcal and 15 grams protein.  - will order Juven BID, each packet provides 95 calories, 2.5 grams of protein (collagen), and 9.8 grams of carbohydrate (3 grams sugar); also contains 7 grams of L-arginine and L-glutamine, 300 mg vitamin C, 15 mg vitamin E, 1.2 mcg vitamin B-12, 9.5 mg zinc, 200 mg calcium, and 1.5 g  Calcium Beta-hydroxy-Beta-methylbutyrate to support wound healing - will order 500 mg ascorbic acid BID and 220 mg zinc sulfate/day to aid in wound healing.  - weigh patient today.    NUTRITION DIAGNOSIS:   Increased nutrient needs related to acute illness, wound healing as evidenced by estimated needs.  GOAL:   Patient will meet greater than or equal to 90% of their needs  MONITOR:   PO intake, Supplement acceptance, Labs, Weight trends, Skin, I & O's  REASON FOR ASSESSMENT:   LOS (day #27)  ASSESSMENT:   55 year old male with medical history of DM, HTN, depression, and chronic back pain on chronic narcotics 2/2 spinal stenosis. He presented to the ED d/t generalized malaise x3 weeks. He tested positive for COVID at home on 01/12/21 and presented to the ED on 5/28 after falling out of a chair and having subsequent shoulder pain. Imaging was negative so he was given a sling for comfort. He returned to the ED on 6/8 d/t inability to walk. 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 CT chest showed large L lateral chest injury  Recently documented meal intakes: 7/1- 100% of breakfast and lunch (total of 1896 kcal and 83 grams protein) 7/2- 100% of breakfast and lunch (total of 1611 kcal and 48 grams protein) 7/3- 100% of breakfast (284 kcal and 4 grams protein) 7/4- 100% of breakfast (989 kcal and 28 grams protein)  No oral nutrition supplement have been ordered throughout admission.    Patient sitting in the chair eating a late lunch and wife at bedside. They both report that patient has been eating very well throughout hospitalization. He denies much issues with chewing or pain with chewing despite dental issues. He denies any swallowing issues or discomfort.   Discussed the importance of nutrition and protein for wound healing. Patient also greatly enjoys fruit. He was eating a piece of cake brought in for him and reported that yesterday was his brother's birthday.   Patient is able to self-feed without issue despite bilateral shoulder surgeries and wound vacs.   Weight on 6/28, the last time patient was weighed, was 214 lb. Weight on admission date of 6/8 was 198 lb. Weight on 6/28 was his highest weight since 2019. Non-pitting edema to BLE.   He is POD #17 I&D of L chest wall abscesses, excisional debridement of L lateral chest wall skin, soft tissue, muscle, and fascia, and of axilla. He is POD #7 exploration of L axilla. Surgery note from today states no plan for further OR visits. Surgery signing off as of today.   Bilateral shoulder abscesses s/p debridements on 6/16, 6/18, and 6/28. Plan for repeat I&D with Ortho on 7/6.  Mandibular abscess, T3-6 and T10-11 osteomyelitis, L1-3 osteomyelitis and discitis, and bilateral psoas abscess (R>L).    Labs reviewed; CBGs: 151 and 287 mg/dl, Na: 378 mmol/l, Cl: 93 mmol/l, creatinine: 0.46 mg/dl, Ca: 8.7 mg/dl.  Medications reviewed; 325 mg ferrous sulfate/day BID, sliding scale novolog, 20  units lantus/day, 17 g miralax/day.    NUTRITION - FOCUSED PHYSICAL EXAM:  Completed; no muscle or fat depletions, mild edema to BLE.   Diet Order:   Diet Order             Diet regular Room service appropriate? Yes; Fluid consistency: Thin  Diet effective now                   EDUCATION NEEDS:   Education needs have been addressed  Skin:  Skin Assessment: Skin Integrity Issues: Skin Integrity Issues:: Incisions, Wound  VAC, Stage II Stage II: bilateral buttocks Wound Vac: bilateral shoulders Incisions: L chest (6/18, 6/28); R shoulder (6/16, 6/18, 6/28); L shoulder (6/28); back (6/28)  Last BM:  7/1  Height:   Ht Readings from Last 1 Encounters:  02/20/21 5\' 10"  (1.778 m)    Weight:   Wt Readings from Last 1 Encounters:  02/20/21 97.3 kg     Estimated Nutritional Needs:  Kcal:  2500-2700 kcal Protein:  125-140 grams Fluid:  >/= 2.5 L/day       02/22/21, MS, RD, LDN, CNSC Inpatient Clinical Dietitian RD pager # available in [facility]  After hours/weekend pager # available in [facility]

## 2021-02-27 NOTE — Progress Notes (Signed)
Subjective:  Patient alert and oriented sitting up on side of bed. He moved overnight and lost suction on his left wound vac. Still continues to have pain of bilateral shoulder left worse than right, worse with movement, has not improved. Denies chest pain or shortness of breath. Denies jaw pain. No pain in other joints. Ongoing low back pain, denies radicular symptoms.   Objective:  PE: VITALS:   Vitals:   02/26/21 2349 02/27/21 0305 02/27/21 0400 02/27/21 0822  BP:  120/78    Pulse:  88    Resp: 16 20 12 12   Temp:  99.1 F (37.3 C)    TempSrc:  Oral    SpO2: 98% 98% 100%   Weight:      Height:       MSK: 0-20 degrees of active forward flexion of right shoulder, 30 deg abduction, 0-5 deg left shoulder. Full ROM of bilateral elbows, wrist, and all fingers of hands. R wound vac in place with good seal, 250 cc's in R shoulder canister, no seal on L wound vac 10 cc's in L canister. Distal sensation and capillary refill intact. 5/5 grip strength bilaterally. Increased swelling seen at posterior scapular wound site, no area of fluctuance.   LABS  Results for orders placed or performed during the hospital encounter of 01/31/21 (from the past 24 hour(s))  Glucose, capillary     Status: Abnormal   Collection Time: 02/26/21 11:31 AM  Result Value Ref Range   Glucose-Capillary 231 (H) 70 - 99 mg/dL  Glucose, capillary     Status: Abnormal   Collection Time: 02/26/21  4:23 PM  Result Value Ref Range   Glucose-Capillary 158 (H) 70 - 99 mg/dL  Glucose, capillary     Status: Abnormal   Collection Time: 02/26/21 10:18 PM  Result Value Ref Range   Glucose-Capillary 160 (H) 70 - 99 mg/dL  Glucose, capillary     Status: Abnormal   Collection Time: 02/27/21  8:16 AM  Result Value Ref Range   Glucose-Capillary 151 (H) 70 - 99 mg/dL    CT CHEST WO CONTRAST  Result Date: 02/26/2021 CLINICAL DATA:  Bilateral shoulder abscesses/septic arthritis. EXAM: CT CHEST WITHOUT CONTRAST TECHNIQUE:  Multidetector CT imaging of the chest was performed following the standard protocol without IV contrast. COMPARISON:  02/18/2021 FINDINGS: Cardiovascular: Mild aortic arch atherosclerotic calcification. Mediastinum/Nodes: Reactive left axillary lymph nodes. Lungs/Pleura: Old granulomatous disease. Mild bandlike atelectasis in both lower lobes. 0.4 by 0.3 cm subpleural nodule anteriorly in the left upper lobe on image 64 series 5, stable. 0.8 by 0.6 cm lingular nodule, previously 1.0 by 0.9 cm. Continued wedge-shaped band of density in the inferior lingula, unchanged. 0.3 cm lingular nodule on image 83 series 5, previously 0.5 by 0.4 cm. The trace bilateral pleural effusions have resolved. Upper Abdomen: Unremarkable Musculoskeletal: Abnormalities in the shoulder regions are detailed on dedicated shoulder CT scans. Interval removal of the left flank catheter with some residual gas potentially along the original track or wound extending within and adjacent to the left latissimus dorsi anterior margin. Periapical lucencies along mandibular teeth, cannot exclude periapical abscesses. Prominent bridging spurring throughout much of the thoracic spine. IMPRESSION: 1. Reduced size of 2 lingular nodules, hence more likely inflammatory. 2. Interval removal of the left flank drain along the latissimus dorsi. Small amount of gas along the drain track noted. 3. Resolution of prior trace bilateral pleural effusions. 4. Periapical lucencies along the mandibular teeth, cannot exclude periapical abscesses. 5. Findings  in the shoulder regions are detailed on separate reports. 6.  Aortic Atherosclerosis (ICD10-I70.0). 7. Reactive left axillary lymph nodes. Electronically Signed   By: Gaylyn Rong M.D.   On: 02/26/2021 13:05   CT SHOULDER LEFT WO CONTRAST  Result Date: 02/26/2021 CLINICAL DATA:  Bilateral shoulder septic arthritis - L shoulder irrigation and debridement 6/16- R shoulder irrigation and debridement with distal  clavicle resection 6/17- s/p bilateral shoulder irrigation and debridement with wound vac placement bilaterally 6/29 EXAM: CT OF THE UPPER LEFT EXTREMITY WITHOUT CONTRAST TECHNIQUE: Multidetector CT imaging of the upper left extremity was performed according to the standard protocol. COMPARISON:  Multiple exams, including 02/18/2021 FINDINGS: Bones/Joint/Cartilage Continued complex effusion of the left glenohumeral joint with gas tracking in the subscapular recess and along the indistinct anterior joint margin. No bony destructive findings. Ligaments Suboptimally assessed by CT. Muscles and Tendons New gas and fluid tracks along the short head of the biceps in the shoulder region, concerning for abscess extension in this vicinity. Soft tissues New wound along the anterior shoulder compatible with interval incision and drainage, with wound VAC in place. Given the complex effusion and synovitis, continued infection in the glenohumeral joint is a possibility. Abnormal indistinctness of tissue planes between the subscapularis and chest wall compatible with inflammation extending in this vicinity. There is potentially some fluid in this region is well especially along the cephalad partial in such image 27 series 7. IMPRESSION: 1. Interval incision and drainage of the left shoulder with anterior wound with wound VAC noted. 2. Continued abnormal complex fluid and gas in the shoulder joint including the subscapular recess, continued infection is a distinct possibility. 3. Increased gas and fluid within along the short head of the biceps below the level of the acromion, compatible with abscess extension in this vicinity. 4. Effacement of soft tissue interfaces between the subscapularis and the ribs/intercostal muscle, with a small amount of gas in this space, compatible with abscess. Electronically Signed   By: Gaylyn Rong M.D.   On: 02/26/2021 12:41   CT SHOULDER RIGHT WO CONTRAST  Result Date: 02/26/2021 CLINICAL  DATA:  Bilateral shoulder septic arthritis - L shoulder irrigation and debridement 6/16- R shoulder irrigation and debridement with distal clavicle resection 6/17- s/p bilateral shoulder irrigation and debridement with wound vac placement bilaterally 6/29 EXAM: CT OF THE UPPER RIGHT EXTREMITY WITHOUT CONTRAST TECHNIQUE: Multidetector CT imaging of the upper right extremity was performed according to the standard protocol. COMPARISON:  Right shoulder CT 02/18/2021 FINDINGS: Bones/Joint/Cartilage Prior distal clavicular resection. No current bony destructive findings. Continued complex material in the right glenohumeral joint. Ligaments Suboptimally assessed by CT. Muscles and Tendons Fluid could continues to surround the short head of the biceps tendon and proximal muscle. Much of this is just deep to the pectoralis major but no longer demonstrates the speckled gas density that was present in this fluid previously. Soft tissues Continued subcoracoid fluid extension just above the subscapularis. Anterior wound noted with wound VAC in place. IMPRESSION: 1. New anterior incision and drainage with wound VAC in place. There continues to be complex fluid in the glenohumeral joint and infection in the joint cannot be readily excluded on today's noncontrast exam. Continued fluid density in the subscapular recess. 2. Continued fluid tracking around the short head of the biceps and deep to the pectoralis musculature, although the gas in this fluid collection has resolved. Electronically Signed   By: Gaylyn Rong M.D.   On: 02/26/2021 12:47   CT MAXILLOFACIAL W CONTRAST  Result Date: 02/26/2021 CLINICAL DATA:  Periapical lucencies EXAM: CT MAXILLOFACIAL WITH CONTRAST TECHNIQUE: Multidetector CT imaging of the maxillofacial structures was performed with intravenous contrast. Multiplanar CT image reconstructions were also generated. CONTRAST:  45mL OMNIPAQUE IOHEXOL 300 MG/ML  SOLN COMPARISON:  Chest CT 02/26/2021  FINDINGS: Osseous: There are periapical lucencies of teeth 23, 24, 26, 27 and 28. There is erosion of the anterior mandibular cortex without subperiosteal abscess. Orbits: Negative. No traumatic or inflammatory finding. Sinuses: Clear Soft tissues: Normal Limited intracranial: Unremarkable IMPRESSION: Periapical lucencies of teeth 23, 24, 26, 27 and 28. There is erosion of the anterior mandibular cortex without subperiosteal abscess. Electronically Signed   By: Deatra Robinson M.D.   On: 02/26/2021 21:41    Assessment/Plan: Bilateral shoulder septic arthritis - L shoulder irrigation and debridement 6/16 - R shoulder irrigation and debridement with distal clavicle resection 6/17 - s/p drain placement 6/24 with IR for perispinal and psoas abscesses - s/p bilateral shoulder irrigation and debridement with wound vac placement bilaterally 6/29 Repeat CT scans show continued fluid collections in bilateral shoulder possible for continued abscesses - left wound vac lost suction overnight due to pulling on cord, replaced this morning with good seal today. Patient refused sponge change, L shoulder sponge has significant purulence when I was changing the vac - continue plan for repeat I&D with Dr. Everardo Pacific tomorrow, NPO after midinight - IV antibiotics per ID  Thoracic and lumbar osteomyelitis - no surgical indication per neurosurgery - will continue to monitor symptoms  Bilateral psoas abscesses - most recent CT showed resolution of abscesses, drains have been pulled by IR  Possible mandibular abscess - CT shows periapical lucencies of teeth 23, 24, 26, 27 and 28. There is erosion of the anterior mandibular cortex without subperiosteal abscess. - oral surgery was consulted by primary   Armida Sans 02/27/2021, 8:47 AM

## 2021-02-27 NOTE — Progress Notes (Signed)
PROGRESS NOTE    AUTREY HUMAN  MWU:[id number] DOB: Sep 02, 1965 DOA: 01/31/2021 PCP: Junie Spencer, FNP   Brief Narrative:  55 year old with history of chronic pain, HTN, DM2, COVID-positive at home 5/20 reporting of body pain, shoulder pain eventually came to the ER.  Found to have MSSA bacteremia started on IV Ancef.  Large fluid collection on the left lateral posterior chest and back was noted.  Underwent MRI T-spine and CT CAP showing large multiloculated effusion  and osteomyelitis of thoracic spine.  There was concerns for septic emboli but echo was negative for TEE performed on 6/13 showed large tricuspid vegetation.  General surgery and orthopedic consulted.  Underwent left shoulder debridement 02/08/2021.  Postop had increasing size of his abscess collection therefore general surgery performed repeat debridement with wound VAC placement.  Dressing management per surgical team.  CT maxillofacial showed oral lucencies therefore oral surgery consulted.   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  Large left posterior wall and bilateral psoas muscle abscess, bilateral septic shoulder joint, osteomyelitis and discitis.  Also possible floaters but endopthalmitis was ruled out. - Left shoulder debridement 6/16, right shoulder debridement 6/17, drain placement by IR 6/24.  Status post bilateral shoulder debridement with wound VAC placement 6/29.  Pathology for necrotic lymph node -benign adipose  tissue - Continue IV Ancef -ID following - PCA for pain control. Oxycodone increased to 10mg  q4hrs prn -Repeat CT shoulder performed.  Ortho plans on repeat I&D tomorrow - CT maxillofacial shows dental lucencies.  Oral surgery consulted  Poor dentition with dental lucencies - Potential source.  Oral surgery consulted  Anemia of chronic disease.   -Oral iron supplements with bowel regimen.  Status post 2 units PRBC 6/30.  Closely monitor.   DKA.  Resolved.  Type 2 diabetes mellitus.   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   CBG within goal.  Gabapentin 400 mg 3 times daily -Hold off on Premeal coverage.  Lantus 20 units daily. - ISS   Ascending aorta dilatation (HCC) - TTE on 6/10 notes mild dilation of ascending aorta, 38 mm - follow TEE results as well: normal thoracic and ascending aorta noted on 02/05/21   Anxiety - Wellbutrin and Zoloft on hold for now in setting of hyponatremia   COVID-19 infection-resolved   DVT prophylaxis: Lovenox Code Status: Full Family Communication:    Status is: Inpatient  Remains inpatient appropriate because:Inpatient level of care appropriate due to severity of illness.  Maintain hospital stay until cleared by orthopedic and general surgery.  Complex surgical case with multiple wounds requiring debridement  Dispo: The patient is from: Home              Anticipated d/c is to: SNF              Patient currently is not medically stable to d/c.   Difficult to place patient No     Body  mass index is 30.78 kg/m.  Pressure Injury 01/31/21 Buttocks Right;Left;Posterior Stage 2 -  Partial thickness loss of dermis presenting as a shallow open injury with a red, pink wound bed without slough. (Active)  01/31/21 1546  Location: Buttocks  Location Orientation: Right;Left;Posterior  Staging: Stage 2 -  Partial thickness loss of dermis presenting as a shallow open injury with a red, pink  wound bed without slough.  Wound Description (Comments):   Present on Admission: Yes      Subjective: Feels okay this morning no complaints.  Admits of poor dentition for some time now.  Poor follow-up with outpatient dentist due to financial reasons.  Review of Systems Otherwise negative except as per HPI, including: General: Denies fever, chills, night sweats or unintended weight loss. Resp: Denies cough, wheezing, shortness of breath. Cardiac: Denies chest pain, palpitations, orthopnea, paroxysmal nocturnal dyspnea. GI: Denies abdominal pain, nausea, vomiting, diarrhea or constipation GU: Denies dysuria, frequency, hesitancy or incontinence MS: Denies muscle aches, joint pain or swelling Neuro: Denies headache, neurologic deficits (focal weakness, numbness, tingling), abnormal gait Psych: Denies anxiety, depression, SI/HI/AVH Skin: Denies new rashes or lesions ID: Denies sick contacts, exotic exposures, travel  Examination: Constitutional: Not in acute distress Respiratory: Clear to auscultation bilaterally Cardiovascular: Normal sinus rhythm, no rubs Abdomen: Nontender nondistended good bowel sounds Musculoskeletal: No edema noted Skin: No rashes seen Neurologic: CN 2-12 grossly intact.  And nonfocal Psychiatric: Normal judgment and insight. Alert and oriented x 3. Normal mood.  Multiple drains, wound VAC and skin dressings noted throughout his body   Objective: Vitals:   02/26/21 2349 02/27/21 0305 02/27/21 0400 02/27/21 0822  BP:  120/78    Pulse:  88    Resp: 16 20 12 12   Temp:  99.1 F (37.3 C)    TempSrc:  Oral    SpO2: 98% 98% 100%   Weight:      Height:        Intake/Output Summary (Last 24 hours) at 02/27/2021 1047 Last data filed at 02/27/2021 0600 Gross per 24 hour  Intake 240 ml  Output 2550 ml  Net -2310 ml   Filed Weights   02/05/21 0500 02/08/21 1540 02/20/21 1419  Weight: 97.3 kg 97.3 kg 97.3 kg     Data Reviewed:   CBC: Recent Labs   Lab 02/21/21 0505 02/22/21 0452 02/23/21 0436 02/24/21 0517 02/25/21 0431 02/26/21 0501  WBC 8.8 9.3 9.9 11.9* 10.9* 10.5  NEUTROABS 7.8*  --   --   --   --   --   HGB 7.6* 6.3* 8.3* 9.2* 9.2* 9.8*  HCT 24.2* 19.8* 25.6* 28.9* 29.3* 31.1*  MCV 94.5 93.0 90.8 92.0 93.0 92.0  PLT 630* 743* 743* 823* 724* 844*   Basic Metabolic Panel: Recent Labs  Lab 02/21/21 0505 02/22/21 0452 02/23/21 0436 02/24/21 0517 02/25/21 0431 02/26/21 0501  NA 131* 137 137 132* 135 132*  K 4.7 3.7 4.2 4.2 4.3 4.2  CL 95* 102 98 94* 96* 93*  CO2 27 31 31 29 30 30   GLUCOSE 371* 90 94 126* 96 159*  BUN 10 9 6 6 6 6   CREATININE 0.56* 0.40* 0.47* 0.47* 0.49* 0.46*  CALCIUM 8.0* 7.9* 8.0* 8.3* 8.5* 8.7*  MG 1.8  --   --  1.8 1.8 1.8   GFR: Estimated Creatinine Clearance: 124.9 mL/min (A) (by C-G formula based on SCr of 0.46 mg/dL (L)). Liver Function Tests: No results for input(s): AST, ALT, ALKPHOS, BILITOT, PROT, ALBUMIN in the last  168 hours. No results for input(s): LIPASE, AMYLASE in the last 168 hours. No results for input(s): AMMONIA in the last 168 hours. Coagulation Profile: No results for input(s): INR, PROTIME in the last 168 hours. Cardiac Enzymes: No results for input(s): CKTOTAL, CKMB, CKMBINDEX, TROPONINI in the last 168 hours. BNP (last 3 results) No results for input(s): PROBNP in the last 8760 hours. HbA1C: No results for input(s): HGBA1C in the last 72 hours. CBG: Recent Labs  Lab 02/26/21 0746 02/26/21 1131 02/26/21 1623 02/26/21 2218 02/27/21 0816  GLUCAP 202* 231* 158* 160* 151*   Lipid Profile: No results for input(s): CHOL, HDL, LDLCALC, TRIG, CHOLHDL, LDLDIRECT in the last 72 hours. Thyroid Function Tests: No results for input(s): TSH, T4TOTAL, FREET4, T3FREE, THYROIDAB in the last 72 hours. Anemia Panel: No results for input(s): VITAMINB12, FOLATE, FERRITIN, TIBC, IRON, RETICCTPCT in the last 72 hours.  Sepsis Labs: No results for input(s): PROCALCITON,  LATICACIDVEN in the last 168 hours.  No results found for this or any previous visit (from the past 240 hour(s)).        Radiology Studies: CT CHEST WO CONTRAST  Result Date: 02/26/2021 CLINICAL DATA:  Bilateral shoulder abscesses/septic arthritis. EXAM: CT CHEST WITHOUT CONTRAST TECHNIQUE: Multidetector CT imaging of the chest was performed following the standard protocol without IV contrast. COMPARISON:  02/18/2021 FINDINGS: Cardiovascular: Mild aortic arch atherosclerotic calcification. Mediastinum/Nodes: Reactive left axillary lymph nodes. Lungs/Pleura: Old granulomatous disease. Mild bandlike atelectasis in both lower lobes. 0.4 by 0.3 cm subpleural nodule anteriorly in the left upper lobe on image 64 series 5, stable. 0.8 by 0.6 cm lingular nodule, previously 1.0 by 0.9 cm. Continued wedge-shaped band of density in the inferior lingula, unchanged. 0.3 cm lingular nodule on image 83 series 5, previously 0.5 by 0.4 cm. The trace bilateral pleural effusions have resolved. Upper Abdomen: Unremarkable Musculoskeletal: Abnormalities in the shoulder regions are detailed on dedicated shoulder CT scans. Interval removal of the left flank catheter with some residual gas potentially along the original track or wound extending within and adjacent to the left latissimus dorsi anterior margin. Periapical lucencies along mandibular teeth, cannot exclude periapical abscesses. Prominent bridging spurring throughout much of the thoracic spine. IMPRESSION: 1. Reduced size of 2 lingular nodules, hence more likely inflammatory. 2. Interval removal of the left flank drain along the latissimus dorsi. Small amount of gas along the drain track noted. 3. Resolution of prior trace bilateral pleural effusions. 4. Periapical lucencies along the mandibular teeth, cannot exclude periapical abscesses. 5. Findings in the shoulder regions are detailed on separate reports. 6.  Aortic Atherosclerosis (ICD10-I70.0). 7. Reactive left  axillary lymph nodes. Electronically Signed   By: Gaylyn Rong M.D.   On: 02/26/2021 13:05   CT SHOULDER LEFT WO CONTRAST  Result Date: 02/26/2021 CLINICAL DATA:  Bilateral shoulder septic arthritis - L shoulder irrigation and debridement 6/16- R shoulder irrigation and debridement with distal clavicle resection 6/17- s/p bilateral shoulder irrigation and debridement with wound vac placement bilaterally 6/29 EXAM: CT OF THE UPPER LEFT EXTREMITY WITHOUT CONTRAST TECHNIQUE: Multidetector CT imaging of the upper left extremity was performed according to the standard protocol. COMPARISON:  Multiple exams, including 02/18/2021 FINDINGS: Bones/Joint/Cartilage Continued complex effusion of the left glenohumeral joint with gas tracking in the subscapular recess and along the indistinct anterior joint margin. No bony destructive findings. Ligaments Suboptimally assessed by CT. Muscles and Tendons New gas and fluid tracks along the short head of the biceps in the shoulder region, concerning for abscess extension in  this vicinity. Soft tissues New wound along the anterior shoulder compatible with interval incision and drainage, with wound VAC in place. Given the complex effusion and synovitis, continued infection in the glenohumeral joint is a possibility. Abnormal indistinctness of tissue planes between the subscapularis and chest wall compatible with inflammation extending in this vicinity. There is potentially some fluid in this region is well especially along the cephalad partial in such image 27 series 7. IMPRESSION: 1. Interval incision and drainage of the left shoulder with anterior wound with wound VAC noted. 2. Continued abnormal complex fluid and gas in the shoulder joint including the subscapular recess, continued infection is a distinct possibility. 3. Increased gas and fluid within along the short head of the biceps below the level of the acromion, compatible with abscess extension in this vicinity. 4.  Effacement of soft tissue interfaces between the subscapularis and the ribs/intercostal muscle, with a small amount of gas in this space, compatible with abscess. Electronically Signed   By: Gaylyn RongWalter  Liebkemann M.D.   On: 02/26/2021 12:41   CT SHOULDER RIGHT WO CONTRAST  Result Date: 02/26/2021 CLINICAL DATA:  Bilateral shoulder septic arthritis - L shoulder irrigation and debridement 6/16- R shoulder irrigation and debridement with distal clavicle resection 6/17- s/p bilateral shoulder irrigation and debridement with wound vac placement bilaterally 6/29 EXAM: CT OF THE UPPER RIGHT EXTREMITY WITHOUT CONTRAST TECHNIQUE: Multidetector CT imaging of the upper right extremity was performed according to the standard protocol. COMPARISON:  Right shoulder CT 02/18/2021 FINDINGS: Bones/Joint/Cartilage Prior distal clavicular resection. No current bony destructive findings. Continued complex material in the right glenohumeral joint. Ligaments Suboptimally assessed by CT. Muscles and Tendons Fluid could continues to surround the short head of the biceps tendon and proximal muscle. Much of this is just deep to the pectoralis major but no longer demonstrates the speckled gas density that was present in this fluid previously. Soft tissues Continued subcoracoid fluid extension just above the subscapularis. Anterior wound noted with wound VAC in place. IMPRESSION: 1. New anterior incision and drainage with wound VAC in place. There continues to be complex fluid in the glenohumeral joint and infection in the joint cannot be readily excluded on today's noncontrast exam. Continued fluid density in the subscapular recess. 2. Continued fluid tracking around the short head of the biceps and deep to the pectoralis musculature, although the gas in this fluid collection has resolved. Electronically Signed   By: Gaylyn RongWalter  Liebkemann M.D.   On: 02/26/2021 12:47   CT MAXILLOFACIAL W CONTRAST  Result Date: 02/26/2021 CLINICAL DATA:   Periapical lucencies EXAM: CT MAXILLOFACIAL WITH CONTRAST TECHNIQUE: Multidetector CT imaging of the maxillofacial structures was performed with intravenous contrast. Multiplanar CT image reconstructions were also generated. CONTRAST:  75mL OMNIPAQUE IOHEXOL 300 MG/ML  SOLN COMPARISON:  Chest CT 02/26/2021 FINDINGS: Osseous: There are periapical lucencies of teeth 23, 24, 26, 27 and 28. There is erosion of the anterior mandibular cortex without subperiosteal abscess. Orbits: Negative. No traumatic or inflammatory finding. Sinuses: Clear Soft tissues: Normal Limited intracranial: Unremarkable IMPRESSION: Periapical lucencies of teeth 23, 24, 26, 27 and 28. There is erosion of the anterior mandibular cortex without subperiosteal abscess. Electronically Signed   By: Deatra RobinsonKevin  Herman M.D.   On: 02/26/2021 21:41        Scheduled Meds:  sodium chloride   Intravenous Once   acetaminophen  500 mg Oral QID   enoxaparin (LOVENOX) injection  40 mg Subcutaneous Q24H   ferrous sulfate  325 mg Oral BID WC  gabapentin  400 mg Oral TID   HYDROmorphone   Intravenous Q4H   insulin aspart  0-15 Units Subcutaneous TID WC   insulin aspart  0-5 Units Subcutaneous QHS   insulin glargine  15 Units Subcutaneous Daily   mouth rinse  15 mL Mouth Rinse BID   polyethylene glycol  17 g Oral Daily   senna-docusate  1 tablet Oral BID   sodium chloride flush  5 mL Intracatheter Q8H   Continuous Infusions:  sodium chloride 250 mL (02/22/21 1738)    ceFAZolin (ANCEF) IV 2 g (02/27/21 0819)     LOS: 27 days   Time spent= 35 mins    Haider Hornaday Joline Maxcy, MD [facility]  If 7PM-7AM, please contact night-coverage  02/27/2021, 10:47 AM

## 2021-02-27 NOTE — Progress Notes (Signed)
Progress Note  7 Days Post-Op  Subjective: Patient reports some pain in shoulders and chronic back pain. He reports he has been tolerating dressing changes to L chest well.   Objective: Vital signs in last 24 hours: Temp:  [99.1 F (37.3 C)-99.8 F (37.7 C)] 99.1 F (37.3 C) (07/05 0305) Pulse Rate:  [88-91] 88 (07/05 0305) Resp:  [12-20] 16 (07/05 1255) BP: (120-122)/(78-86) 120/78 (07/05 0305) SpO2:  [98 %-100 %] 100 % (07/05 1255) FiO2 (%):  [100 %] 100 % (07/05 0400) Last BM Date: 02/26/21  Intake/Output from previous day: 07/04 0701 - 07/05 0700 In: 720 [P.O.:720] Out: 2550 [Urine:2550] Intake/Output this shift: No intake/output data recorded.  PE: General: pleasant, WD, WN male who is sitting in chair Heart: regular, rate, and rhythm.   Lungs: Respiratory effort nonlabored Chest wall: wounds to L chest wall and axilla as noted below with mostly healthy appearing granulation tissue, small amount fibrinous appearing tissue in wound bases     Abd: soft, NT, ND MS: VAC to BL shoulders   Lab Results:  Recent Labs    02/25/21 0431 02/26/21 0501  WBC 10.9* 10.5  HGB 9.2* 9.8*  HCT 29.3* 31.1*  PLT 724* 844*   BMET Recent Labs    02/25/21 0431 02/26/21 0501  NA 135 132*  K 4.3 4.2  CL 96* 93*  CO2 30 30  GLUCOSE 96 159*  BUN 6 6  CREATININE 0.49* 0.46*  CALCIUM 8.5* 8.7*   PT/INR No results for input(s): LABPROT, INR in the last 72 hours. CMP     Component Value Date/Time   NA 132 (L) 02/26/2021 0501   NA 137 10/09/2020 1155   K 4.2 02/26/2021 0501   CL 93 (L) 02/26/2021 0501   CO2 30 02/26/2021 0501   GLUCOSE 159 (H) 02/26/2021 0501   BUN 6 02/26/2021 0501   BUN 11 10/09/2020 1155   CREATININE 0.46 (L) 02/26/2021 0501   CALCIUM 8.7 (L) 02/26/2021 0501   PROT 5.2 (L) 02/14/2021 0212   PROT 7.6 10/09/2020 1155   ALBUMIN 1.8 (L) 02/14/2021 0212   ALBUMIN 4.7 10/09/2020 1155   AST 53 (H) 02/14/2021 0212   ALT 30 02/14/2021 0212    ALKPHOS 53 02/14/2021 0212   BILITOT 0.7 02/14/2021 0212   BILITOT 0.8 10/09/2020 1155   GFRNONAA >60 02/26/2021 0501   GFRAA 113 10/09/2020 1155   Lipase  No results found for: LIPASE     Studies/Results: CT CHEST WO CONTRAST  Result Date: 02/26/2021 CLINICAL DATA:  Bilateral shoulder abscesses/septic arthritis. EXAM: CT CHEST WITHOUT CONTRAST TECHNIQUE: Multidetector CT imaging of the chest was performed following the standard protocol without IV contrast. COMPARISON:  02/18/2021 FINDINGS: Cardiovascular: Mild aortic arch atherosclerotic calcification. Mediastinum/Nodes: Reactive left axillary lymph nodes. Lungs/Pleura: Old granulomatous disease. Mild bandlike atelectasis in both lower lobes. 0.4 by 0.3 cm subpleural nodule anteriorly in the left upper lobe on image 64 series 5, stable. 0.8 by 0.6 cm lingular nodule, previously 1.0 by 0.9 cm. Continued wedge-shaped band of density in the inferior lingula, unchanged. 0.3 cm lingular nodule on image 83 series 5, previously 0.5 by 0.4 cm. The trace bilateral pleural effusions have resolved. Upper Abdomen: Unremarkable Musculoskeletal: Abnormalities in the shoulder regions are detailed on dedicated shoulder CT scans. Interval removal of the left flank catheter with some residual gas potentially along the original track or wound extending within and adjacent to the left latissimus dorsi anterior margin. Periapical lucencies along mandibular teeth, cannot exclude  periapical abscesses. Prominent bridging spurring throughout much of the thoracic spine. IMPRESSION: 1. Reduced size of 2 lingular nodules, hence more likely inflammatory. 2. Interval removal of the left flank drain along the latissimus dorsi. Small amount of gas along the drain track noted. 3. Resolution of prior trace bilateral pleural effusions. 4. Periapical lucencies along the mandibular teeth, cannot exclude periapical abscesses. 5. Findings in the shoulder regions are detailed on separate  reports. 6.  Aortic Atherosclerosis (ICD10-I70.0). 7. Reactive left axillary lymph nodes. Electronically Signed   By: Gaylyn RongWalter  Liebkemann M.D.   On: 02/26/2021 13:05   CT SHOULDER LEFT WO CONTRAST  Result Date: 02/26/2021 CLINICAL DATA:  Bilateral shoulder septic arthritis - L shoulder irrigation and debridement 6/16- R shoulder irrigation and debridement with distal clavicle resection 6/17- s/p bilateral shoulder irrigation and debridement with wound vac placement bilaterally 6/29 EXAM: CT OF THE UPPER LEFT EXTREMITY WITHOUT CONTRAST TECHNIQUE: Multidetector CT imaging of the upper left extremity was performed according to the standard protocol. COMPARISON:  Multiple exams, including 02/18/2021 FINDINGS: Bones/Joint/Cartilage Continued complex effusion of the left glenohumeral joint with gas tracking in the subscapular recess and along the indistinct anterior joint margin. No bony destructive findings. Ligaments Suboptimally assessed by CT. Muscles and Tendons New gas and fluid tracks along the short head of the biceps in the shoulder region, concerning for abscess extension in this vicinity. Soft tissues New wound along the anterior shoulder compatible with interval incision and drainage, with wound VAC in place. Given the complex effusion and synovitis, continued infection in the glenohumeral joint is a possibility. Abnormal indistinctness of tissue planes between the subscapularis and chest wall compatible with inflammation extending in this vicinity. There is potentially some fluid in this region is well especially along the cephalad partial in such image 27 series 7. IMPRESSION: 1. Interval incision and drainage of the left shoulder with anterior wound with wound VAC noted. 2. Continued abnormal complex fluid and gas in the shoulder joint including the subscapular recess, continued infection is a distinct possibility. 3. Increased gas and fluid within along the short head of the biceps below the level of the  acromion, compatible with abscess extension in this vicinity. 4. Effacement of soft tissue interfaces between the subscapularis and the ribs/intercostal muscle, with a small amount of gas in this space, compatible with abscess. Electronically Signed   By: Gaylyn RongWalter  Liebkemann M.D.   On: 02/26/2021 12:41   CT SHOULDER RIGHT WO CONTRAST  Result Date: 02/26/2021 CLINICAL DATA:  Bilateral shoulder septic arthritis - L shoulder irrigation and debridement 6/16- R shoulder irrigation and debridement with distal clavicle resection 6/17- s/p bilateral shoulder irrigation and debridement with wound vac placement bilaterally 6/29 EXAM: CT OF THE UPPER RIGHT EXTREMITY WITHOUT CONTRAST TECHNIQUE: Multidetector CT imaging of the upper right extremity was performed according to the standard protocol. COMPARISON:  Right shoulder CT 02/18/2021 FINDINGS: Bones/Joint/Cartilage Prior distal clavicular resection. No current bony destructive findings. Continued complex material in the right glenohumeral joint. Ligaments Suboptimally assessed by CT. Muscles and Tendons Fluid could continues to surround the short head of the biceps tendon and proximal muscle. Much of this is just deep to the pectoralis major but no longer demonstrates the speckled gas density that was present in this fluid previously. Soft tissues Continued subcoracoid fluid extension just above the subscapularis. Anterior wound noted with wound VAC in place. IMPRESSION: 1. New anterior incision and drainage with wound VAC in place. There continues to be complex fluid in the glenohumeral joint and  infection in the joint cannot be readily excluded on today's noncontrast exam. Continued fluid density in the subscapular recess. 2. Continued fluid tracking around the short head of the biceps and deep to the pectoralis musculature, although the gas in this fluid collection has resolved. Electronically Signed   By: Gaylyn Rong M.D.   On: 02/26/2021 12:47   CT  MAXILLOFACIAL W CONTRAST  Result Date: 02/26/2021 CLINICAL DATA:  Periapical lucencies EXAM: CT MAXILLOFACIAL WITH CONTRAST TECHNIQUE: Multidetector CT imaging of the maxillofacial structures was performed with intravenous contrast. Multiplanar CT image reconstructions were also generated. CONTRAST:  62mL OMNIPAQUE IOHEXOL 300 MG/ML  SOLN COMPARISON:  Chest CT 02/26/2021 FINDINGS: Osseous: There are periapical lucencies of teeth 23, 24, 26, 27 and 28. There is erosion of the anterior mandibular cortex without subperiosteal abscess. Orbits: Negative. No traumatic or inflammatory finding. Sinuses: Clear Soft tissues: Normal Limited intracranial: Unremarkable IMPRESSION: Periapical lucencies of teeth 23, 24, 26, 27 and 28. There is erosion of the anterior mandibular cortex without subperiosteal abscess. Electronically Signed   By: Deatra Robinson M.D.   On: 02/26/2021 21:41    Anti-infectives: Anti-infectives (From admission, onward)    Start     Dose/Rate Route Frequency Ordered Stop   02/20/21 1630  vancomycin (VANCOCIN) powder  Status:  Discontinued          As needed 02/20/21 1649 02/20/21 1943   02/10/21 0727  ceFAZolin (ANCEF) 2-4 GM/100ML-% IVPB       Note to Pharmacy: Lyda Kalata   : cabinet override      02/10/21 0727 02/10/21 1856   02/01/21 0600  ceFAZolin (ANCEF) IVPB 2g/100 mL premix        2 g 200 mL/hr over 30 Minutes Intravenous Every 8 hours 02/01/21 0414     01/31/21 1800  cefTRIAXone (ROCEPHIN) 1 g in sodium chloride 0.9 % 100 mL IVPB  Status:  Discontinued        1 g 200 mL/hr over 30 Minutes Intravenous Every 24 hours 01/31/21 1453 02/01/21 0425   01/31/21 1115  vancomycin (VANCOREADY) IVPB 2000 mg/400 mL        2,000 mg 200 mL/hr over 120 Minutes Intravenous  Once 01/31/21 1105 01/31/21 1320   01/31/21 1045  ceFEPIme (MAXIPIME) 2 g in sodium chloride 0.9 % 100 mL IVPB        2 g 200 mL/hr over 30 Minutes Intravenous  Once 01/31/21 1035 01/31/21 1129   01/31/21 1045   metroNIDAZOLE (FLAGYL) IVPB 500 mg        500 mg 100 mL/hr over 60 Minutes Intravenous  Once 01/31/21 1035 01/31/21 1208   01/31/21 1045  vancomycin (VANCOCIN) IVPB 1000 mg/200 mL premix  Status:  Discontinued        1,000 mg 200 mL/hr over 60 Minutes Intravenous  Once 01/31/21 1035 01/31/21 1105        Assessment/Plan Complex left lateral chest wall and axillary abscess POD 17S/p Incision and drainage of multiple left lateral chest wall abscesses along with excisional debridement of left lateral chest wall skin, soft tissue, muscle, and fascia and axilla 6/18 Dr. Andrey Campanile POD 7 s/p exploration of the left axilla - Dr. Daphine Deutscher - 6/28 - Cx with abundant staphylococcus aureus - ID following and managing antibiotics - Axillary nodule (left) path without malignancy  - Continue BID dressing changes - wounds are stable at this time, no further recommendations from a general surgery standpoint. We will sign off at this time, please call if any  concerns or changes in left chest wall wounds. Can follow up for wound check whenever he is discharged    Bilateral shoulder abscess, s/p debridement 6/16, 6/18 and 6/28 - Per Dr. 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 Septic arthitis right T3-4 and T4-5 facets on MRI Bilateral psoas abscess, R>L (3.7cm) - s/p drain placement x 2 on 6/23 by IR (now out) HTN DM Chronic back pain  LOS: 27 days    Juliet Rude, [facility] Surgery 02/27/2021, 2:02 PM Please see Amion for pager number during day hours 7:00am-4:30pm

## 2021-02-27 NOTE — Progress Notes (Signed)
Physical Therapy Treatment Patient Details Name: John Ware MRN: [id number] DOB: 1966-04-16 Today's Date: 02/27/2021    History of Present Illness 55 year old with history of chronic pain, HTN, DM2, COVID-positive at home 5/20 reporting of body pain, shoulder pain eventually came to the ER.  Found to have MSSA bacteremia started on IV Ancef.  Large fluid collection on the left lateral posterior chest and back was noted.  Underwent MRI T-spine and CT CAP showing large multiloculated effusion  and osteomyelitis of thoracic spine.  There was concerns for septic emboli but echo was negative for TEE performed on 6/13 showed large tricuspid vegetation.  General surgery and orthopedic consulted.  Underwent left shoulder debridement 02/08/2021.  Postop had increasing size of his abscess collection therefore general surgery performed repeat debridement with wound VAC placement.  Dressing management per surgical team.    PT Comments    Pt OOB in recliner. Pt continues to be motivated to amb. and participate in session. Pt progressing well. Pt was able to amb in hallway with min guard 200 feet with SPC with no LOB. Some lateral postural sway noted along with fatigue. Pt returned to recliner with wife present. Wife works from home and agreeable to patient D/C to home. Consulted with LPT regarding updated D/C plans and patient progress. PT will add stair goal as pt stated he has 2 stairs to enter home with 2 rails.    Follow Up Recommendations  Home health PT     Equipment Recommendations  Rolling walker with 5" wheels    Recommendations for Other Services Rehab consult     Precautions / Restrictions Precautions Precautions: Fall Precaution Comments: B UE slings for comfort but has NOT wore them > week, Bilateral wound VACs Required Braces or Orthoses: Sling Restrictions Weight Bearing Restrictions: No Other Position/Activity Restrictions: per orders 6/18 patient is WBAT B UEs    Mobility   Bed Mobility               General bed mobility comments: OOB in recliner    Transfers Overall transfer level: Needs assistance Equipment used: None Transfers: Sit to/from Stand Sit to Stand: Supervision Stand pivot transfers: Min guard          Ambulation/Gait Ambulation/Gait assistance: Min guard Gait Distance (Feet): 200 Feet Assistive device: Straight cane Gait Pattern/deviations: Step-through pattern;Decreased stride length;Trunk flexed Gait velocity: decr   General Gait Details: amb with cane. No LOB noted, some increased unsteadiness on cane vs. RW.   Assist with safety managing multiple lines.   Stairs             Wheelchair Mobility    Modified Rankin (Stroke Patients Only)       Balance Overall balance assessment: Needs assistance Sitting-balance support: No upper extremity supported Sitting balance-[name]: Good                                      Cognition Arousal/Alertness: Awake/alert Behavior During Therapy: WFL for tasks assessed/performed;Restless Overall Cognitive Status: Within Functional Limits for tasks assessed                                 General Comments: AxO x 3 following all commands but still with need of intermittent redirection to focus on task at hand. Easily distracted.      Exercises  General Comments        Pertinent Vitals/Pain Pain Score: 8  Pain Location: L low back Pain Descriptors / Indicators: Sore Pain Intervention(s): Limited activity within patient's tolerance;Monitored during session    Home Living                      Prior Function            PT Goals (current goals can now be found in the care plan section) Acute Rehab PT Goals Patient Stated Goal: to go home at discharge PT Goal Formulation: With patient Time For Goal Achievement: 02/23/21 Potential to Achieve Goals: Good Progress towards PT goals: Progressing toward goals     Frequency    Min 3X/week      PT Plan Current plan remains appropriate    Co-evaluation              AM-PAC PT "6 Clicks" Mobility   Outcome Measure  Help needed turning from your back to your side while in a flat bed without using bedrails?: A Little Help needed moving from lying on your back to sitting on the side of a flat bed without using bedrails?: A Little Help needed moving to and from a bed to a chair (including a wheelchair)?: A Little Help needed standing up from a chair using your arms (e.g., wheelchair or bedside chair)?: A Little Help needed to walk in hospital room?: A Little Help needed climbing 3-5 steps with a railing? : A Lot 6 Click Score: 17    End of Session Equipment Utilized During Treatment: Gait belt Activity Tolerance: Patient tolerated treatment well Patient left: in chair;with call bell/phone within reach;with chair alarm set Nurse Communication: Mobility status PT Visit Diagnosis: Unsteadiness on feet (R26.81);History of falling (Z91.81);Difficulty in walking, not elsewhere classified (R26.2)     Time: 1740-8144 PT Time Calculation (min) (ACUTE ONLY): 20 min  Charges:  $Gait Training: 8-22 mins                     Alma Friendly, PTA Student  Acute Rehabilitation Services Pager : [phone] Office : [phone]    Alma Friendly 02/27/2021, 4:07 PM

## 2021-02-27 NOTE — Progress Notes (Signed)
[facility]    Date of Admission:  01/31/2021   Total days of antibiotics since 6/08          ID: John Ware is a 55 y.o. male with  disseminated MSSA infection, TV endocarditis, pulm septic emboli, bilateral psoas m abscess s/p drain placement, discitis, left shoulder septic arthritis with surrounding muscle abscess. Hx of lumbar fusion in 2019. Admitted on 6/08 with sepsis from MSSA infection.last fever 6/25 Active Problems:   Right knee pain   Diabetes mellitus (HCC)   Hyponatremia   Pressure injury of skin   HTN (hypertension)   Anxiety   Left shoulder pain   Ascending aorta dilatation (HCC)   Vision changes   Endocarditis   Right shoulder pain   Chest wall abscess   Obesity   Normocytic anemia   Thrombocytosis   Psoas muscle abscess (HCC)    Subjective: Afebrile. Had repeat imaging of shoulder yesterday still showing evidence of residual infection. Plan for repeat I x D tomorrow by dr Everardo Pacific Medications:   (feeding supplement) PROSource Plus  30 mL Oral TID BM   sodium chloride   Intravenous Once   acetaminophen  500 mg Oral QID   vitamin C  500 mg Oral BID   enoxaparin (LOVENOX) injection  40 mg Subcutaneous Q24H   ferrous sulfate  325 mg Oral BID WC   gabapentin  400 mg Oral TID   HYDROmorphone   Intravenous Q4H   insulin aspart  0-15 Units Subcutaneous TID WC   insulin aspart  0-5 Units Subcutaneous QHS   [START ON 02/28/2021] insulin glargine  20 Units Subcutaneous Daily   mouth rinse  15 mL Mouth Rinse BID   nutrition supplement (JUVEN)  1 packet Oral BID BM   polyethylene glycol  17 g Oral Daily   senna-docusate  1 tablet Oral BID   sodium chloride flush  5 mL Intracatheter Q8H   zinc sulfate  220 mg Oral Daily    Objective: Vital signs in last 24 hours: Temp:  [99.1 F (37.3 C)-99.8 F (37.7 C)] 99.1 F (37.3 C) (07/05 0305) Pulse Rate:  [88-91] 88 (07/05 0305) Resp:  [12-20] 16 (07/05 1255) BP: (120-122)/(78-86)  120/78 (07/05 0305) SpO2:  [98 %-100 %] 100 % (07/05 1255) FiO2 (%):  [100 %] 100 % (07/05 0400) Weight:  [93 kg] 93 kg (07/05 1443)  Physical Exam  Constitutional: He is oriented to person, place, and time. He appears well-developed and well-nourished. No distress.  HENT:  Mouth/Throat: Oropharynx is clear and moist. No oropharyngeal exudate. Poor dentition Cardiovascular: Normal rate, regular rhythm and normal heart sounds. Exam reveals no gallop and no friction rub.  No murmur heard.  Pulmonary/Chest: Effort normal and breath sounds normal. No respiratory distress. He has no wheezes.  Abdominal: Soft. Bowel sounds are normal. He exhibits no distension. There is no tenderness.  Chest wall = fluctuance at previous incision by left scapular. Wound vac to anterior left shoulder in place Neurological: He is alert and oriented to person, place, and time.  Skin: Skin is warm and dry. Hands peeling, sore fingertip for BS checks Psychiatric: He has a normal mood and affect. His behavior is normal.   Lab Results Recent Labs    02/25/21 0431 02/26/21 0501  WBC 10.9* 10.5  HGB 9.2* 9.8*  HCT 29.3* 31.1*  NA 135 132*  K 4.3 4.2  CL 96* 93*  CO2 30 30  BUN 6 6  CREATININE 0.49* 0.46*   Lab Results  Component Value Date   ESRSEDRATE 41 (H) 02/07/2021     Microbiology: 6/23 cultures from shoulder sugery + MSSA 6/22 blood cx NGTD Studies/Results: CT CHEST WO CONTRAST  Result Date: 02/26/2021 CLINICAL DATA:  Bilateral shoulder abscesses/septic arthritis. EXAM: CT CHEST WITHOUT CONTRAST TECHNIQUE: Multidetector CT imaging of the chest was performed following the standard protocol without IV contrast. COMPARISON:  02/18/2021 FINDINGS: Cardiovascular: Mild aortic arch atherosclerotic calcification. Mediastinum/Nodes: Reactive left axillary lymph nodes. Lungs/Pleura: Old granulomatous disease. Mild bandlike atelectasis in both lower lobes. 0.4 by 0.3 cm subpleural nodule anteriorly in the  left upper lobe on image 64 series 5, stable. 0.8 by 0.6 cm lingular nodule, previously 1.0 by 0.9 cm. Continued wedge-shaped band of density in the inferior lingula, unchanged. 0.3 cm lingular nodule on image 83 series 5, previously 0.5 by 0.4 cm. The trace bilateral pleural effusions have resolved. Upper Abdomen: Unremarkable Musculoskeletal: Abnormalities in the shoulder regions are detailed on dedicated shoulder CT scans. Interval removal of the left flank catheter with some residual gas potentially along the original track or wound extending within and adjacent to the left latissimus dorsi anterior margin. Periapical lucencies along mandibular teeth, cannot exclude periapical abscesses. Prominent bridging spurring throughout much of the thoracic spine. IMPRESSION: 1. Reduced size of 2 lingular nodules, hence more likely inflammatory. 2. Interval removal of the left flank drain along the latissimus dorsi. Small amount of gas along the drain track noted. 3. Resolution of prior trace bilateral pleural effusions. 4. Periapical lucencies along the mandibular teeth, cannot exclude periapical abscesses. 5. Findings in the shoulder regions are detailed on separate reports. 6.  Aortic Atherosclerosis (ICD10-I70.0). 7. Reactive left axillary lymph nodes. Electronically Signed   By: Gaylyn Rong M.D.   On: 02/26/2021 13:05   CT SHOULDER LEFT WO CONTRAST  Result Date: 02/26/2021 CLINICAL DATA:  Bilateral shoulder septic arthritis - L shoulder irrigation and debridement 6/16- R shoulder irrigation and debridement with distal clavicle resection 6/17- s/p bilateral shoulder irrigation and debridement with wound vac placement bilaterally 6/29 EXAM: CT OF THE UPPER LEFT EXTREMITY WITHOUT CONTRAST TECHNIQUE: Multidetector CT imaging of the upper left extremity was performed according to the standard protocol. COMPARISON:  Multiple exams, including 02/18/2021 FINDINGS: Bones/Joint/Cartilage Continued complex effusion of  the left glenohumeral joint with gas tracking in the subscapular recess and along the indistinct anterior joint margin. No bony destructive findings. Ligaments Suboptimally assessed by CT. Muscles and Tendons New gas and fluid tracks along the short head of the biceps in the shoulder region, concerning for abscess extension in this vicinity. Soft tissues New wound along the anterior shoulder compatible with interval incision and drainage, with wound VAC in place. Given the complex effusion and synovitis, continued infection in the glenohumeral joint is a possibility. Abnormal indistinctness of tissue planes between the subscapularis and chest wall compatible with inflammation extending in this vicinity. There is potentially some fluid in this region is well especially along the cephalad partial in such image 27 series 7. IMPRESSION: 1. Interval incision and drainage of the left shoulder with anterior wound with wound VAC noted. 2. Continued abnormal complex fluid and gas in the shoulder joint including the subscapular recess, continued infection is a distinct possibility. 3. Increased gas and fluid within along the short head of the biceps below the level of the acromion, compatible with abscess extension in this vicinity. 4. Effacement of soft tissue interfaces between the subscapularis and the ribs/intercostal muscle, with a small  amount of gas in this space, compatible with abscess. Electronically Signed   By: Gaylyn Rong M.D.   On: 02/26/2021 12:41   CT SHOULDER RIGHT WO CONTRAST  Result Date: 02/26/2021 CLINICAL DATA:  Bilateral shoulder septic arthritis - L shoulder irrigation and debridement 6/16- R shoulder irrigation and debridement with distal clavicle resection 6/17- s/p bilateral shoulder irrigation and debridement with wound vac placement bilaterally 6/29 EXAM: CT OF THE UPPER RIGHT EXTREMITY WITHOUT CONTRAST TECHNIQUE: Multidetector CT imaging of the upper right extremity was performed  according to the standard protocol. COMPARISON:  Right shoulder CT 02/18/2021 FINDINGS: Bones/Joint/Cartilage Prior distal clavicular resection. No current bony destructive findings. Continued complex material in the right glenohumeral joint. Ligaments Suboptimally assessed by CT. Muscles and Tendons Fluid could continues to surround the short head of the biceps tendon and proximal muscle. Much of this is just deep to the pectoralis major but no longer demonstrates the speckled gas density that was present in this fluid previously. Soft tissues Continued subcoracoid fluid extension just above the subscapularis. Anterior wound noted with wound VAC in place. IMPRESSION: 1. New anterior incision and drainage with wound VAC in place. There continues to be complex fluid in the glenohumeral joint and infection in the joint cannot be readily excluded on today's noncontrast exam. Continued fluid density in the subscapular recess. 2. Continued fluid tracking around the short head of the biceps and deep to the pectoralis musculature, although the gas in this fluid collection has resolved. Electronically Signed   By: Gaylyn Rong M.D.   On: 02/26/2021 12:47   CT MAXILLOFACIAL W CONTRAST  Result Date: 02/26/2021 CLINICAL DATA:  Periapical lucencies EXAM: CT MAXILLOFACIAL WITH CONTRAST TECHNIQUE: Multidetector CT imaging of the maxillofacial structures was performed with intravenous contrast. Multiplanar CT image reconstructions were also generated. CONTRAST:  19mL OMNIPAQUE IOHEXOL 300 MG/ML  SOLN COMPARISON:  Chest CT 02/26/2021 FINDINGS: Osseous: There are periapical lucencies of teeth 23, 24, 26, 27 and 28. There is erosion of the anterior mandibular cortex without subperiosteal abscess. Orbits: Negative. No traumatic or inflammatory finding. Sinuses: Clear Soft tissues: Normal Limited intracranial: Unremarkable IMPRESSION: Periapical lucencies of teeth 23, 24, 26, 27 and 28. There is erosion of the anterior  mandibular cortex without subperiosteal abscess. Electronically Signed   By: Deatra Robinson M.D.   On: 02/26/2021 21:41     Assessment/Plan: Disseminated MSSA infection with endocarditis, thoracic-lumbar discitis and bilateral psoas m abscess s/p drain placement, shoulder septic arthritis with surrounding abscess requiring multiple I x D-  Going to OR tomorrow for further I x D. Continue on cefazolin 2gm IV Q8hr.  Mri of lumbar spine reports 2 x 3 x 6cm fluid collection at previous fusion site - recommend to discuss with IR to see if it can be aspirated to rule out any residual infection.  Peridontal disease/carriers= has poor dentition to lower teeth. Not sure if this is contributing to infection, since more common with strep species. Oral surgery evaluating patient for dental extraction   IDDM with peripheral neuropathy = continue with current regimen for BS management. Patient reports that he will need some sort of needless glucose monitoring system as outpatient.  John Salvia Drue Second MD MPH [facility] [phone]   [facility] Cell: [phone] Pager: [phone]  02/27/2021, 3:37 PM

## 2021-02-28 ENCOUNTER — Encounter (HOSPITAL_COMMUNITY): Payer: Self-pay | Admitting: Internal Medicine

## 2021-02-28 ENCOUNTER — Inpatient Hospital Stay (HOSPITAL_COMMUNITY): Payer: Commercial Managed Care - PPO | Admitting: Anesthesiology

## 2021-02-28 ENCOUNTER — Encounter (HOSPITAL_COMMUNITY)
Admission: EM | Disposition: A | Discharge status: Home Health | Payer: Self-pay | Source: Home / Self Care | Attending: Internal Medicine

## 2021-02-28 DIAGNOSIS — R6520 Severe sepsis without septic shock: Secondary | ICD-10-CM | POA: Diagnosis not present

## 2021-02-28 DIAGNOSIS — A419 Sepsis, unspecified organism: Secondary | ICD-10-CM | POA: Diagnosis not present

## 2021-02-28 HISTORY — PX: IRRIGATION AND DEBRIDEMENT SHOULDER: SHX5880

## 2021-02-28 LAB — CBC
HCT: 28.7 % — ABNORMAL LOW (ref 39.0–52.0)
Hemoglobin: 9.5 g/dL — ABNORMAL LOW (ref 13.0–17.0)
MCH: 29.2 pg (ref 26.0–34.0)
MCHC: 33.1 g/dL (ref 30.0–36.0)
MCV: 88.3 fL (ref 80.0–100.0)
Platelets: 623 10*3/uL — ABNORMAL HIGH (ref 150–400)
RBC: 3.25 MIL/uL — ABNORMAL LOW (ref 4.22–5.81)
RDW: 15.3 % (ref 11.5–15.5)
WBC: 10 10*3/uL (ref 4.0–10.5)
nRBC: 0 % (ref 0.0–0.2)

## 2021-02-28 LAB — BASIC METABOLIC PANEL
Anion gap: 12 (ref 5–15)
BUN: 10 mg/dL (ref 6–20)
CO2: 26 mmol/L (ref 22–32)
Calcium: 8.6 mg/dL — ABNORMAL LOW (ref 8.9–10.3)
Chloride: 93 mmol/L — ABNORMAL LOW (ref 98–111)
Creatinine, Ser: 0.54 mg/dL — ABNORMAL LOW (ref 0.61–1.24)
GFR, Estimated: >60 mL/min (ref >60)
Glucose, Bld: 119 mg/dL — ABNORMAL HIGH (ref 70–99)
Potassium: 4.6 mmol/L (ref 3.5–5.1)
Sodium: 131 mmol/L — ABNORMAL LOW (ref 135–145)

## 2021-02-28 LAB — GLUCOSE, CAPILLARY
Glucose-Capillary: 120 mg/dL — ABNORMAL HIGH (ref 70–99)
Glucose-Capillary: 119 mg/dL — ABNORMAL HIGH (ref 70–99)
Glucose-Capillary: 105 mg/dL — ABNORMAL HIGH (ref 70–99)
Glucose-Capillary: 99 mg/dL (ref 70–99)

## 2021-02-28 LAB — MAGNESIUM: Magnesium: 1.7 mg/dL (ref 1.7–2.4)

## 2021-02-28 SURGERY — IRRIGATION AND DEBRIDEMENT SHOULDER
Anesthesia: General | Site: Shoulder | Laterality: Bilateral

## 2021-02-28 MED ORDER — FENTANYL CITRATE (PF) 250 MCG/5ML IJ SOLN
INTRAMUSCULAR | Status: DC | PRN
  Administered 2021-02-28 (×2): 50 ug via INTRAVENOUS
  Administered 2021-02-28: 150 ug via INTRAVENOUS

## 2021-02-28 MED ORDER — HYDROMORPHONE HCL 1 MG/ML IJ SOLN
0.2500 mg | INTRAMUSCULAR | Status: DC | PRN
  Administered 2021-02-28 (×4): 0.5 mg via INTRAVENOUS
  Filled 2021-02-28: qty 1

## 2021-02-28 MED ORDER — MIDAZOLAM HCL 2 MG/2ML IJ SOLN
INTRAMUSCULAR | Status: AC
  Filled 2021-02-28: qty 2

## 2021-02-28 MED ORDER — MORPHINE SULFATE (PF) 2 MG/ML IV SOLN
2.0000 mg | INTRAVENOUS | Status: AC | PRN
  Administered 2021-02-28 (×2): 2 mg via INTRAVENOUS
  Filled 2021-02-28 (×2): qty 1

## 2021-02-28 MED ORDER — HYDROMORPHONE HCL 1 MG/ML IJ SOLN
INTRAMUSCULAR | Status: AC
  Filled 2021-02-28: qty 1

## 2021-02-28 MED ORDER — MEPERIDINE HCL 50 MG/ML IJ SOLN: 6.2500 mg | INTRAMUSCULAR | Status: DC | PRN

## 2021-02-28 MED ORDER — MIDAZOLAM HCL 2 MG/2ML IJ SOLN: 1.0000 mg | INTRAMUSCULAR | Status: DC

## 2021-02-28 MED ORDER — FENTANYL CITRATE (PF) 100 MCG/2ML IJ SOLN
INTRAMUSCULAR | Status: AC
  Administered 2021-02-28: 50 ug via INTRAVENOUS
  Filled 2021-02-28: qty 2

## 2021-02-28 MED ORDER — MORPHINE SULFATE (PF) 4 MG/ML IV SOLN
2.0000 mg | INTRAVENOUS | Status: AC | PRN
Start: 2021-02-28 — End: 2021-02-28

## 2021-02-28 MED ORDER — ONDANSETRON HCL 4 MG/2ML IJ SOLN
INTRAMUSCULAR | Status: DC | PRN
  Administered 2021-02-28: 4 mg via INTRAVENOUS

## 2021-02-28 MED ORDER — LACTATED RINGERS IV SOLN: INTRAVENOUS | Status: DC | PRN

## 2021-02-28 MED ORDER — FENTANYL CITRATE (PF) 250 MCG/5ML IJ SOLN
INTRAMUSCULAR | Status: AC
  Filled 2021-02-28: qty 5

## 2021-02-28 MED ORDER — MIDAZOLAM HCL 2 MG/2ML IJ SOLN
INTRAMUSCULAR | Status: AC
  Administered 2021-02-28: 2 mg via INTRAVENOUS
  Filled 2021-02-28: qty 2

## 2021-02-28 MED ORDER — ONDANSETRON HCL 4 MG/2ML IJ SOLN
INTRAMUSCULAR | Status: AC
  Filled 2021-02-28: qty 2

## 2021-02-28 MED ORDER — FENTANYL CITRATE (PF) 100 MCG/2ML IJ SOLN
50.0000 ug | Freq: Once | INTRAMUSCULAR | Status: AC
  Administered 2021-02-28: 50 ug via INTRAVENOUS

## 2021-02-28 MED ORDER — VANCOMYCIN HCL 1000 MG IV SOLR
INTRAVENOUS | Status: DC | PRN
  Administered 2021-02-28 (×2): 1000 mg

## 2021-02-28 MED ORDER — CEFAZOLIN SODIUM-DEXTROSE 2-4 GM/100ML-% IV SOLN
2.0000 g | Freq: Three times a day (TID) | INTRAVENOUS | Status: AC
  Administered 2021-03-01 (×2): 2 g via INTRAVENOUS
  Filled 2021-02-28 (×2): qty 100

## 2021-02-28 MED ORDER — ACETAMINOPHEN 10 MG/ML IV SOLN
INTRAVENOUS | Status: AC
  Filled 2021-02-28: qty 100

## 2021-02-28 MED ORDER — ACETAMINOPHEN 10 MG/ML IV SOLN
1000.0000 mg | Freq: Once | INTRAVENOUS | Status: DC | PRN
  Administered 2021-02-28: 1000 mg via INTRAVENOUS

## 2021-02-28 MED ORDER — MIDAZOLAM HCL 5 MG/5ML IJ SOLN
INTRAMUSCULAR | Status: DC | PRN
  Administered 2021-02-28: 1 mg via INTRAVENOUS

## 2021-02-28 MED ORDER — PROPOFOL 10 MG/ML IV BOLUS
INTRAVENOUS | Status: AC
  Filled 2021-02-28: qty 20

## 2021-02-28 MED ORDER — PHENYLEPHRINE HCL (PRESSORS) 10 MG/ML IV SOLN
INTRAVENOUS | Status: AC
  Filled 2021-02-28: qty 1

## 2021-02-28 MED ORDER — MIDAZOLAM HCL 2 MG/2ML IJ SOLN
1.0000 mg | Freq: Once | INTRAMUSCULAR | Status: AC
  Administered 2021-02-28: 1 mg via INTRAVENOUS

## 2021-02-28 MED ORDER — TOBRAMYCIN SULFATE 1.2 G IJ SOLR
INTRAMUSCULAR | Status: DC | PRN
  Administered 2021-02-28 (×2): 1.2 g

## 2021-02-28 MED ORDER — SUCCINYLCHOLINE CHLORIDE 200 MG/10ML IV SOSY
PREFILLED_SYRINGE | INTRAVENOUS | Status: DC | PRN
  Administered 2021-02-28: 120 mg via INTRAVENOUS

## 2021-02-28 MED ORDER — LIDOCAINE 2% (20 MG/ML) 5 ML SYRINGE
INTRAMUSCULAR | Status: DC | PRN
  Administered 2021-02-28: 60 mg via INTRAVENOUS

## 2021-02-28 MED ORDER — ENOXAPARIN SODIUM 40 MG/0.4ML IJ SOSY
40.0000 mg | PREFILLED_SYRINGE | INTRAMUSCULAR | Status: DC
  Administered 2021-03-01 – 2021-03-13 (×10): 40 mg via SUBCUTANEOUS
  Filled 2021-02-28 (×12): qty 0.4

## 2021-02-28 MED ORDER — TOBRAMYCIN SULFATE 1.2 G IJ SOLR
INTRAMUSCULAR | Status: AC
  Filled 2021-02-28: qty 2.4

## 2021-02-28 MED ORDER — CHLORHEXIDINE GLUCONATE CLOTH 2 % EX PADS
6.0000 | MEDICATED_PAD | Freq: Every day | CUTANEOUS | Status: DC
  Administered 2021-03-01 – 2021-03-15 (×16): 6 via TOPICAL

## 2021-02-28 MED ORDER — PROMETHAZINE HCL 25 MG/ML IJ SOLN: 6.2500 mg | INTRAMUSCULAR | Status: DC | PRN

## 2021-02-28 MED ORDER — SODIUM CHLORIDE 0.9% FLUSH
10.0000 mL | INTRAVENOUS | Status: DC | PRN
  Administered 2021-03-07: 10 mL

## 2021-02-28 MED ORDER — FENTANYL CITRATE (PF) 100 MCG/2ML IJ SOLN: 50.0000 ug | INTRAMUSCULAR | Status: DC

## 2021-02-28 MED ORDER — SODIUM CHLORIDE 0.9 % IR SOLN
Status: DC | PRN
  Administered 2021-02-28: 12000 mL

## 2021-02-28 MED ORDER — VANCOMYCIN HCL 1000 MG IV SOLR
INTRAVENOUS | Status: AC
  Filled 2021-02-28: qty 2000

## 2021-02-28 MED ORDER — PROPOFOL 10 MG/ML IV BOLUS
INTRAVENOUS | Status: DC | PRN
  Administered 2021-02-28: 150 mg via INTRAVENOUS

## 2021-02-28 MED ORDER — FENTANYL CITRATE (PF) 100 MCG/2ML IJ SOLN
INTRAMUSCULAR | Status: AC
  Filled 2021-02-28: qty 2

## 2021-02-28 SURGICAL SUPPLY — 44 items
BOOTCOVER CLEANROOM LRG (PROTECTIVE WEAR) ×4 IMPLANT
COVER SURGICAL LIGHT HANDLE (MISCELLANEOUS) ×2 IMPLANT
DRAPE U-SHAPE 47X51 STRL (DRAPES) ×6 IMPLANT
DRESSING PEEL AND PLAC PRVNA20 (GAUZE/BANDAGES/DRESSINGS) IMPLANT
DRESSING PEEL AND PLC PRVNA 13 (GAUZE/BANDAGES/DRESSINGS) IMPLANT
DRSG MEPILEX BORDER 4X8 (GAUZE/BANDAGES/DRESSINGS) IMPLANT
DRSG PAD ABDOMINAL 8X10 ST (GAUZE/BANDAGES/DRESSINGS) IMPLANT
DRSG PEEL AND PLACE PREVENA 13 (GAUZE/BANDAGES/DRESSINGS) ×2
DRSG PEEL AND PLACE PREVENA 20 (GAUZE/BANDAGES/DRESSINGS)
DRSG VAC ATS MED SENSATRAC (GAUZE/BANDAGES/DRESSINGS) ×1 IMPLANT
DURAPREP 26ML APPLICATOR (WOUND CARE) ×4 IMPLANT
ELECT REM PT RETURN 15FT ADLT (MISCELLANEOUS) ×1 IMPLANT
EVACUATOR 1/8 PVC DRAIN (DRAIN) ×2 IMPLANT
GAUZE SPONGE 4X4 12PLY STRL (GAUZE/BANDAGES/DRESSINGS) ×1 IMPLANT
GAUZE XEROFORM 1X8 LF (GAUZE/BANDAGES/DRESSINGS) ×1 IMPLANT
GLOVE SRG 8 PF TXTR STRL LF DI (GLOVE) ×1 IMPLANT
GLOVE SURG ENC MOIS LTX SZ6.5 (GLOVE) ×4 IMPLANT
GLOVE SURG ENC MOIS LTX SZ8 (GLOVE) ×5 IMPLANT
GLOVE SURG UNDER POLY LF SZ6.5 (GLOVE) ×2 IMPLANT
GLOVE SURG UNDER POLY LF SZ8 (GLOVE) ×2
HANDPIECE INTERPULSE COAX TIP (DISPOSABLE) ×4
KIT BASIN OR (CUSTOM PROCEDURE TRAY) ×2 IMPLANT
KIT TURNOVER KIT B (KITS) ×2 IMPLANT
MANIFOLD NEPTUNE II (INSTRUMENTS) ×3 IMPLANT
NS IRRIG 1000ML POUR BTL (IV SOLUTION) ×2 IMPLANT
PACK SHOULDER (CUSTOM PROCEDURE TRAY) ×4 IMPLANT
PAD ARMBOARD 7.5X6 YLW CONV (MISCELLANEOUS) IMPLANT
SET HNDPC FAN SPRY TIP SCT (DISPOSABLE) ×2 IMPLANT
SPONGE T-LAP 18X18 ~~LOC~~+RFID (SPONGE) ×4 IMPLANT
SUPPORT WRAP ARM LG (MISCELLANEOUS) ×2 IMPLANT
SUT ETHILON 2 0 PS N (SUTURE) ×3 IMPLANT
SUT ETHILON 3 0 PS 1 (SUTURE) IMPLANT
SUT VIC AB 0 CT1 27 (SUTURE)
SUT VIC AB 0 CT1 27XBRD ANBCTR (SUTURE) IMPLANT
SUT VIC AB 2-0 CT1 27 (SUTURE)
SUT VIC AB 2-0 CT1 TAPERPNT 27 (SUTURE) IMPLANT
SWAB COLLECTION DEVICE MRSA (MISCELLANEOUS) IMPLANT
SWAB CULTURE ESWAB REG 1ML (MISCELLANEOUS) IMPLANT
TOWEL OR 17X26 10 PK STRL BLUE (TOWEL DISPOSABLE) ×4 IMPLANT
TOWEL OR NON WOVEN STRL DISP B (DISPOSABLE) ×4 IMPLANT
TUBE CONNECTING 12X1/4 (SUCTIONS) ×4 IMPLANT
UNDERPAD 30X36 HEAVY ABSORB (UNDERPADS AND DIAPERS) ×2 IMPLANT
WATER STERILE IRR 1000ML POUR (IV SOLUTION) ×2 IMPLANT
YANKAUER SUCT BULB TIP NO VENT (SUCTIONS) ×4 IMPLANT

## 2021-02-28 NOTE — Interval H&P Note (Signed)
As one of Dr. Shelba Flake partner so I was asked to help in his absence. 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 I've been asked to perform a washout and potential closure. We will perform an incision debridement and either place another wound VAC versus close incisions. He will remain under Dr. Shelba Flake care. All questions answered.

## 2021-02-28 NOTE — Anesthesia Postprocedure Evaluation (Signed)
Anesthesia Post Note  Patient: John Ware  Procedure(s) Performed: IRRIGATION AND DEBRIDEMENT SHOULDER (Bilateral: Shoulder)     Patient location during evaluation: PACU Anesthesia Type: General Level of consciousness: awake and sedated Pain management: pain level not controlled Vital Signs Assessment: post-procedure vital signs reviewed and stable Respiratory status: spontaneous breathing Cardiovascular status: stable Postop Assessment: no apparent nausea or vomiting Anesthetic complications: no   No notable events documented.  Last Vitals:  Vitals:   02/28/21 1730 02/28/21 1745  BP: (!) 169/98 (!) 157/88  Pulse: 66 65  Resp: 10 11  Temp:    SpO2: 100% 100%    Last Pain:  Vitals:   02/28/21 1405  TempSrc:   PainSc: 0-No pain                 Caren Macadam

## 2021-02-28 NOTE — Op Note (Signed)
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  Patient's right side into be fair left side superficially where the wound vacs been placed had good granulation tissue and appear to be healing well.  Unfortunately the deep layers were not able to drain through the wound VAC and there was significant purulent material within the left shoulder within the joint as well as a tract that went down to the axilla, in the subdeltoid space and around the subscapularis.  We are able to manually dissect through these areas and clear gross purulence.  Hemovac drain with 20 holes was left piercing and coursing from within the joint through the subscapularis and into the axillary recess.  There is no involvement it appeared of the posterior scapular previously drained incision.  The right shoulder had fluid buildup within the joint that was concerning for infection however there is no obvious gross purulence.  We felt that the superficial tissue looked healthy.  We washed out the shoulder once again and debrided taking specimens and performing synovectomy.  We left a Hemovac drain within the joint with 10 holes.  We were able to close and place a Prevena strip VAC on the right shoulder.  My plan would be theoretically to remove the Hemovac on the right shoulder when the drainage continues to taper to near 0 and there would be no planned washout on the right again unless symptomatic clinical exam changed.  Regards to the left he will need an additional washout and this may be done with Dr. Dion Saucier early next week.  The level of complexity of  the patient's infections and wounds may require tertiary care center but I would defer this decision making to Dr. Dion Saucier upon his return.  Antibiotic impregnated beads may be necessary to clear this infection which is outside of my scope of practice.  Post-operative plan: The patient will be nonweightbearing bilateral upper extremities.  The patient will be readmitted to the floor.  DVT prophylaxis per primary team, no orthopedic contraindications.   Pain control with PRN pain medication preferring oral medicines.  Patient will likely return early next week to the operating room for wound VAC change and washout of the left shoulder with Dr. Dion Saucier  Post-Op Diagnosis: Same Surgeons:Primary: [name], MD Assistants:John McBane PA-C Location: [facility] ROOM 05 Anesthesia: General Antibiotics:  IV Ancef, bilateral shoulders obtained 1 g of vancomycin and 1.2 g of tobramycin locally each Tourniquet time: * No tourniquets in log * Estimated Blood Loss: 50 Complications: None Specimens: 1 from each shoulder deep tissue Implants: * No implants in log *  Indications for Surgery:   John Ware is a 55 y.o. male with multiple washouts for bilateral shoulder infections and continued purulent material.  Benefits and risks of operative and nonoperative management were discussed prior to surgery with patient/guardian(s) and informed consent form was completed.  Specific risks including infection, need for additional surgery, continued infection, need for further surgery amongst others   Procedure:   The patient was identified properly. Informed consent was obtained and the surgical site was marked. The patient was taken up to suite where general anesthesia was induced.  The patient was positioned beachchair  on Baker table.  The bilateral shoulders were simultaneously prepped and draped in the usual sterile fashion.  Timeout was performed before the beginning of the case.  We removed the bilateral  shoulder wound vacs and bluntly opened each deltopectoral incision.  We focused on the right side first as it was the less unhealthy appearing.  We were careful not to contaminate the right side from the left.  We opened and performed a synovectomy of the deep tissues including muscle, tendon, fascia and skin.  This was excisionally debrided.  We remove the upper border the subscapularis that had been torn and was clearly unhealthy and infected.  The majority of the subscapularis was intact though there was no superior cuff otherwise.  We irrigated with 6 L normal saline in the joint noting that the fluid ran clear.  There is no obvious purulent material within the joint.  There was some unhealthy appearing tissue outside of the joint.  This was debrided.  We placed a medium Hemovac drain into the joint with 10 holes left on the drain.  This was sewn in place.  We closed the incision after placing local vancomycin and tobramycin powder and placed a Prevena wound VAC.  This held good suction we turned attention to the left side.  We again performed excisional debridement of skin, rotator cuff stump, biceps, purulent material using a knife, rondure and curette.  We were able to perform a synovectomy of the shoulder using a rondure preserving most of the subscapularis as it was still intact.  We used finger dissection to dissect a axillary abscess as it tracked down and tried to dissect around the subscapularis that was quite difficult while rotating the function of the subscapularis itself.  We cleared gross purulence in the subcu bursal space.  There was gross purulence throughout the joint.  No obvious signs of osteomyelitis on physical examination during surgery.  Irrigated with 6 L normal saline before placing local vancomycin powder and tobramycin powder within the joint.  We able to place a medium Hemovac drain through the subscapularis muscular portion leaving port part of it in an axillary abscess  cavity and part of it within the joint.  We then placed a deep wound VAC along the muscular tissue within the shoulder and sewed it loosely in place to keep skin edges approximated.  Were able to hold suction with the wound VAC and the Hemovac drain.  Patient was awoken taken to PACU in stable condition.  Alfonse Alpers, PA-C, present and scrubbed throughout the case, critical for completion in a timely fashion, and for retraction, instrumentation, closure.

## 2021-02-28 NOTE — Anesthesia Procedure Notes (Signed)
Central Venous Catheter Insertion Performed by: Dorris Singh, MD, anesthesiologist Start/End7/01/2021 2:35 AM, 02/28/2021 2:50 AM Patient location: Pre-op. Preanesthetic checklist: patient identified, IV checked, site marked, risks and benefits discussed, surgical consent, monitors and equipment checked, pre-op evaluation and timeout performed Position: reverse Trendelenburg Lidocaine 1% used for infiltration and patient sedated Hand hygiene performed , maximum sterile barriers used  and Seldinger technique used Central line was placed.Double lumen Procedure performed using ultrasound guided technique. Ultrasound Notes:anatomy identified and no ultrasound evidence of intravascular and/or intraneural injection Attempts: 1 Following insertion, line sutured, dressing applied and Biopatch. Post procedure assessment: blood return through all ports and no air  Patient tolerated the procedure well with no immediate complications.

## 2021-02-28 NOTE — Progress Notes (Signed)
PROGRESS NOTE    John Ware  WNI:[id number] DOB: Jan 22, 1966 DOA: 01/31/2021 PCP: Junie Spencer, FNP   Brief Narrative:  55 year old with history of chronic pain, HTN, DM2, COVID-positive at home 5/20 reporting of body pain, shoulder pain eventually came to the ER.  Found to have MSSA bacteremia started on IV Ancef.  Large fluid collection on the left lateral posterior chest and back was noted.  Underwent MRI T-spine and CT CAP showing large multiloculated effusion  and osteomyelitis of thoracic spine.  There was concerns for septic emboli but echo was negative for TEE performed on 6/13 showed large tricuspid vegetation.  General surgery and orthopedic consulted.  Underwent left shoulder debridement 02/08/2021.  Postop had increasing size of his abscess collection therefore general surgery performed repeat debridement with wound VAC placement.  Dressing management per surgical team.  CT maxillofacial showed oral lucencies therefore oral surgery consulted.  Oral surgery did not think this was acute and Low suspicion for source per ID.   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  Large left posterior wall and bilateral psoas muscle abscess, bilateral septic shoulder joint, osteomyelitis and discitis.  Also possible floaters but endopthalmitis was ruled out. - Left shoulder debridement 6/16, right shoulder debridement 6/17, drain placement by IR 6/24.  Status post bilateral shoulder debridement with  wound VAC placement 6/29.  Pathology for necrotic lymph node -benign adipose tissue - Continue IV Ancef -ID following - PCA for pain control. Oxycodone 10mg  q4hrs prn, morphine IV while npo today -Repeat CT shoulder performed.  Ortho plans on repeat I&D today - CT maxillofacial shows dental lucencies.  Oral surgery, Dr didn't think there is anything acute. Low suspicious for potential source for ID. Can follow up with Dentist outpatient.   Poor dentition with dental lucencies - Potential source.  Oral surgery, Dr Ross Marcus didn't think there is anything acute. Low suspicious for potential source for ID. Can follow up with Dentist outpatient.   Anemia of chronic disease.   -Oral iron supplements with bowel regimen.  Status post 2 units PRBC 6/30.  Closely monitor.   DKA.  Resolved.  Type 2 diabetes mellitus.   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   CBG within goal.  Gabapentin 400 mg 3 times daily -Hold off on Premeal coverage.  Lantus 20 units daily. - ISS   Ascending aorta dilatation (HCC) - TTE on 6/10 notes mild dilation of ascending aorta, 38 mm - follow TEE results as well: normal thoracic and ascending aorta noted on 02/05/21   Anxiety - Wellbutrin and Zoloft on hold for now in setting of hyponatremia   COVID-19 infection-resolved   DVT prophylaxis: Lovenox Code Status: Full Family Communication:    Status is: Inpatient  Remains inpatient appropriate because:Inpatient level of care appropriate due to severity of illness.  Maintain hospital stay until cleared by orthopedic and general surgery.  Complex surgical case with multiple  wounds requiring debridement  Dispo: The patient is from: Home              Anticipated d/c is to: SNF              Patient currently is not medically stable to d/c.   Difficult to place patient No     Body mass index is 29.43 kg/m.  Pressure Injury 01/31/21 Buttocks  Right;Left;Posterior Stage 2 -  Partial thickness loss of dermis presenting as a shallow open injury with a red, pink wound bed without slough. (Active)  01/31/21 1546  Location: Buttocks  Location Orientation: Right;Left;Posterior  Staging: Stage 2 -  Partial thickness loss of dermis presenting as a shallow open injury with a red, pink wound bed without slough.  Wound Description (Comments):   Present on Admission: Yes      Subjective: Reports of generalized pain since he has been npo. Afebrile overnight.  He understands to see a dentist outpatient.  Review of Systems Otherwise negative except as per HPI, including: General = no fevers, chills, dizziness,  fatigue HEENT/EYES = negative for loss of vision, double vision, blurred vision,  sore throa Cardiovascular= negative for chest pain, palpitation Respiratory/lungs= negative for shortness of breath, cough, wheezing; hemoptysis,  Gastrointestinal= negative for nausea, vomiting, abdominal pain Genitourinary= negative for Dysuria MSK = Negative for arthralgia, myalgias Neurology= Negative for headache, numbness, tingling  Psychiatry= Negative for suicidal and homocidal ideation Skin= Negative for Rash   Examination: Constitutional: Not in acute distress Respiratory: Clear to auscultation bilaterally Cardiovascular: Normal sinus rhythm, no rubs Abdomen: Nontender nondistended good bowel sounds Musculoskeletal: No edema noted Skin: No rashes seen Neurologic: CN 2-12 grossly intact.  And nonfocal Psychiatric: Normal judgment and insight. Alert and oriented x 3. Normal mood.    Multiple drains, wound VAC and skin dressings noted throughout his body   Objective: Vitals:   02/28/21 0030 02/28/21 0347 02/28/21 0442 02/28/21 0754  BP:  131/90    Pulse:  94    Resp: 18 16 18 14   Temp:  98.3 F (36.8 C)    TempSrc:  Oral    SpO2: 98% 95% 100% 98%  Weight:      Height:        Intake/Output Summary (Last 24 hours) at  02/28/2021 1052 Last data filed at 02/28/2021 1000 Gross per 24 hour  Intake 940.68 ml  Output --  Net 940.68 ml   Filed Weights   02/08/21 1540 02/20/21 1419 02/27/21 1443  Weight: 97.3 kg 97.3 kg 93 kg     Data Reviewed:   CBC: Recent Labs  Lab 02/23/21 0436 02/24/21 0517 02/25/21 0431 02/26/21 0501 02/28/21 0753  WBC 9.9 11.9* 10.9* 10.5 10.0  HGB 8.3* 9.2* 9.2* 9.8* 9.5*  HCT 25.6* 28.9* 29.3* 31.1* 28.7*  MCV 90.8 92.0 93.0 92.0 88.3  PLT 743* 823* 724* 844* 623*   Basic Metabolic Panel: Recent Labs  Lab 02/23/21 0436 02/24/21 0517 02/25/21 0431 02/26/21 0501 02/28/21 0753  NA 137 132* 135 132* 131*  K 4.2 4.2 4.3 4.2 4.6  CL 98 94* 96* 93* 93*  CO2 31 29 30 30 26   GLUCOSE 94 126* 96 159* 119*  BUN 6 6 6 6 10   CREATININE 0.47* 0.47* 0.49* 0.46* 0.54*  CALCIUM 8.0* 8.3* 8.5* 8.7* 8.6*  MG  --  1.8 1.8 1.8 1.7   GFR: Estimated Creatinine Clearance: 122.3 mL/min (A) (by C-G formula based on SCr of 0.54 mg/dL (L)). Liver Function Tests:  No results for input(s): AST, ALT, ALKPHOS, BILITOT, PROT, ALBUMIN in the last 168 hours. No results for input(s): LIPASE, AMYLASE in the last 168 hours. No results for input(s): AMMONIA in the last 168 hours. Coagulation Profile: No results for input(s): INR, PROTIME in the last 168 hours. Cardiac Enzymes: No results for input(s): CKTOTAL, CKMB, CKMBINDEX, TROPONINI in the last 168 hours. BNP (last 3 results) No results for input(s): PROBNP in the last 8760 hours. HbA1C: No results for input(s): HGBA1C in the last 72 hours. CBG: Recent Labs  Lab 02/27/21 0816 02/27/21 1141 02/27/21 1650 02/27/21 2233 02/28/21 0734  GLUCAP 151* 287* 79 159* 120*   Lipid Profile: No results for input(s): CHOL, HDL, LDLCALC, TRIG, CHOLHDL, LDLDIRECT in the last 72 hours. Thyroid Function Tests: No results for input(s): TSH, T4TOTAL, FREET4, T3FREE, THYROIDAB in the last 72 hours. Anemia Panel: No results for input(s): VITAMINB12,  FOLATE, FERRITIN, TIBC, IRON, RETICCTPCT in the last 72 hours.  Sepsis Labs: No results for input(s): PROCALCITON, LATICACIDVEN in the last 168 hours.  No results found for this or any previous visit (from the past 240 hour(s)).        Radiology Studies: CT MAXILLOFACIAL W CONTRAST  Result Date: 02/26/2021 CLINICAL DATA:  Periapical lucencies EXAM: CT MAXILLOFACIAL WITH CONTRAST TECHNIQUE: Multidetector CT imaging of the maxillofacial structures was performed with intravenous contrast. Multiplanar CT image reconstructions were also generated. CONTRAST:  49mL OMNIPAQUE IOHEXOL 300 MG/ML  SOLN COMPARISON:  Chest CT 02/26/2021 FINDINGS: Osseous: There are periapical lucencies of teeth 23, 24, 26, 27 and 28. There is erosion of the anterior mandibular cortex without subperiosteal abscess. Orbits: Negative. No traumatic or inflammatory finding. Sinuses: Clear Soft tissues: Normal Limited intracranial: Unremarkable IMPRESSION: Periapical lucencies of teeth 23, 24, 26, 27 and 28. There is erosion of the anterior mandibular cortex without subperiosteal abscess. Electronically Signed   By: Deatra Robinson M.D.   On: 02/26/2021 21:41        Scheduled Meds:  (feeding supplement) PROSource Plus  30 mL Oral TID BM   sodium chloride   Intravenous Once   acetaminophen  1,000 mg Oral Once   acetaminophen  500 mg Oral QID   vitamin C  500 mg Oral BID   enoxaparin (LOVENOX) injection  40 mg Subcutaneous Q24H   ferrous sulfate  325 mg Oral BID WC   gabapentin  400 mg Oral TID   HYDROmorphone   Intravenous Q4H   insulin aspart  0-15 Units Subcutaneous TID WC   insulin aspart  0-5 Units Subcutaneous QHS   insulin glargine  20 Units Subcutaneous Daily   mouth rinse  15 mL Mouth Rinse BID   nutrition supplement (JUVEN)  1 packet Oral BID BM   polyethylene glycol  17 g Oral Daily   povidone-iodine  2 application Topical Once   senna-docusate  1 tablet Oral BID   sodium chloride flush  5 mL Intracatheter  Q8H   zinc sulfate  220 mg Oral Daily   Continuous Infusions:  sodium chloride 250 mL (02/22/21 1738)    ceFAZolin (ANCEF) IV 2 g (02/28/21 0754)     LOS: 28 days   Time spent= 35 mins    Adison Jerger Joline Maxcy, MD [facility]  If 7PM-7AM, please contact night-coverage  02/28/2021, 10:52 AM

## 2021-02-28 NOTE — Anesthesia Procedure Notes (Signed)
Procedure Name: Intubation Date/Time: 02/28/2021 3:30 PM Performed by: Elyn Peers, CRNA Pre-anesthesia Checklist: Patient identified, Emergency Drugs available, Suction available, Patient being monitored and Timeout performed Patient Re-evaluated:Patient Re-evaluated prior to induction Oxygen Delivery Method: Circle system utilized Preoxygenation: Pre-oxygenation with 100% oxygen Induction Type: IV induction Ventilation: Mask ventilation without difficulty Laryngoscope Size: Miller and 3 Grade View: Grade I Tube type: Oral Tube size: 7.5 mm Number of attempts: 1 Airway Equipment and Method: Stylet Placement Confirmation: ETT inserted through vocal cords under direct vision, positive ETCO2 and breath sounds checked- equal and bilateral Secured at: 23 cm Tube secured with: Tape Dental Injury: Teeth and Oropharynx as per pre-operative assessment

## 2021-02-28 NOTE — Progress Notes (Addendum)
   02/28/21 1238  Therapy Vitals  Resp 18  Oxygen Therapy  SpO2 98 %  O2 Device Nasal Cannula  O2 Flow Rate (L/min) 2 L/min  End Tidal CO2 (EtCO2) 51  Mobility  Activity Ambulated in hall  Level of Assistance Modified independent, requires aide device or extra time  Assistive Device Front wheel walker  Distance Ambulated (ft) 400 ft  Mobility Ambulated with assistance in hallway  Mobility Response Tolerated well  Mobility performed by Mobility specialist  $Mobility charge 1 Mobility   Pt agreed to walk in the hallway. Currently on 2L O2. Requires more than one person secondary to wound vacs, O2, and IV pole. Walked 400' with front wheeled walker. Stopped occasionally for rest. Pt states he really enjoys walking in the hallway and would like to continue regular walks while he is here in the hospital. No other complaints.   John Ware Mobility Specialist Acute Rehab Services Office: [phone]

## 2021-02-28 NOTE — Anesthesia Preprocedure Evaluation (Addendum)
Anesthesia Evaluation  Patient identified by MRN, date of birth, ID band Patient awake    Reviewed: Allergy & Precautions, NPO status , Patient's Chart, lab work & pertinent test results  Airway Mallampati: I       Dental  (+) Chipped,    Pulmonary former smoker,    Pulmonary exam normal        Cardiovascular hypertension, Pt. on medications Normal cardiovascular exam     Neuro/Psych PSYCHIATRIC DISORDERS Anxiety Depression    GI/Hepatic negative GI ROS, Neg liver ROS,   Endo/Other  diabetes, Well Controlled, Type 2, Oral Hypoglycemic Agents  Renal/GU negative Renal ROS     Musculoskeletal   Abdominal Normal abdominal exam  (+)   Peds  Hematology  (+) anemia ,   Anesthesia Other Findings   Reproductive/Obstetrics                                                             Anesthesia Evaluation  Patient identified by MRN, date of birth, ID band Patient awake    Reviewed: Allergy & Precautions, H&P , NPO status , Patient's Chart, lab work & pertinent test results  Airway Mallampati: II  TM Distance: >3 FB Neck ROM: Full    Dental  (+) Poor Dentition, Chipped, Missing, Loose, Dental Advisory Given,  Front upper teeth permanent bridge:   Pulmonary former smoker,  Quit smoking 2012, 30 pack year history    Pulmonary exam normal breath sounds clear to auscultation       Cardiovascular hypertension, Pt. on medications Normal cardiovascular exam Rhythm:Regular Rate:Normal  ECHO 6/22 1. Left ventricular ejection fraction, by estimation, is 55 to 60%. The left ventricle has normal function. The left ventricle has no regional wall motion abnormalities. There is mild left ventricular hypertrophy. Left ventricular diastolic parameters were normal. 2. Right ventricular systolic function is normal. The right ventricular size is normal. 3. The mitral valve is normal in structure.  No evidence of mitral valve regurgitation. No evidence of mitral stenosis. 4. The aortic valve is tricuspid. There is mild calcification of the aortic valve. Aortic valve regurgitation is not visualized. Mild aortic valve sclerosis is present, with no evidence of aortic valve stenosis. 5. Aortic dilatation noted. There is mild dilatation of the ascending aorta, measuring 38 mm. 6. The inferior vena cava is normal in size with greater than 50% respiratory variability, suggesting right atrial pressure of 3 mmHg.   Neuro/Psych PSYCHIATRIC DISORDERS Anxiety Depression negative neurological ROS     GI/Hepatic negative GI ROS, Neg liver ROS,   Endo/Other  diabetes, Well Controlled, Type 2, Oral Hypoglycemic AgentsFS 99  Renal/GU   negative genitourinary   Musculoskeletal  (+) Arthritis , Osteoarthritis,  Spinal stenosis    Abdominal   Peds negative pediatric ROS (+)  Hematology  (+) Blood dyscrasia, anemia , H/H 7.7/23.5   Anesthesia Other Findings Bilateral infected shoulder  Reproductive/Obstetrics negative OB ROS                            Anesthesia Physical Anesthesia Plan  ASA: 3  Anesthesia Plan: General   Post-op Pain Management:    Induction: Intravenous  PONV Risk Score and Plan: 2 and Dexamethasone, Ondansetron, Midazolam and Treatment may vary due to age  or medical condition  Airway Management Planned: Oral ETT  Additional Equipment: None  Intra-op Plan:   Post-operative Plan: Extubation in OR  Informed Consent: I have reviewed the patients History and Physical, chart, labs and discussed the procedure including the risks, benefits and alternatives for the proposed anesthesia with the patient or authorized representative who has indicated his/her understanding and acceptance.     Dental advisory given  Plan Discussed with: CRNA  Anesthesia Plan Comments:        Anesthesia Quick Evaluation                                    Anesthesia Evaluation  Patient identified by MRN, date of birth, ID band Patient awake    Reviewed: Allergy & Precautions, H&P , NPO status , Patient's Chart, lab work & pertinent test results  Airway Mallampati: II  TM Distance: >3 FB Neck ROM: Full    Dental no notable dental hx. (+) Poor Dentition, Chipped, Missing, Loose, Dental Advisory Given   Pulmonary neg pulmonary ROS, former smoker,    Pulmonary exam normal breath sounds clear to auscultation       Cardiovascular hypertension, Pt. on medications Normal cardiovascular exam Rhythm:Regular Rate:Normal  ECHO 6/22 1. Left ventricular ejection fraction, by estimation, is 55 to 60%. The left ventricle has normal function. The left ventricle has no regional wall motion abnormalities. There is mild left ventricular hypertrophy. Left ventricular diastolic parameters were normal. 2. Right ventricular systolic function is normal. The right ventricular size is normal. 3. The mitral valve is normal in structure. No evidence of mitral valve regurgitation. No evidence of mitral stenosis. 4. The aortic valve is tricuspid. There is mild calcification of the aortic valve. Aortic valve regurgitation is not visualized. Mild aortic valve sclerosis is present, with no evidence of aortic valve stenosis. 5. Aortic dilatation noted. There is mild dilatation of the ascending aorta, measuring 38 mm. 6. The inferior vena cava is normal in size with greater than 50% respiratory variability, suggesting right atrial pressure of 3 mmHg.   Neuro/Psych PSYCHIATRIC DISORDERS Anxiety Depression    GI/Hepatic negative GI ROS, Neg liver ROS,   Endo/Other  negative endocrine ROSdiabetes, Type 2, Oral Hypoglycemic Agents  Renal/GU   negative genitourinary   Musculoskeletal  (+) Arthritis , Osteoarthritis,    Abdominal   Peds negative pediatric ROS (+)  Hematology  (+) Blood dyscrasia, anemia ,   Anesthesia Other Findings    Reproductive/Obstetrics negative OB ROS                            Anesthesia Physical  Anesthesia Plan  ASA: 3  Anesthesia Plan: General   Post-op Pain Management:    Induction: Intravenous  PONV Risk Score and Plan: 3 and Ondansetron, Treatment may vary due to age or medical condition and Midazolam  Airway Management Planned: Oral ETT and LMA  Additional Equipment: None  Intra-op Plan:   Post-operative Plan: Extubation in OR  Informed Consent: I have reviewed the patients History and Physical, chart, labs and discussed the procedure including the risks, benefits and alternatives for the proposed anesthesia with the patient or authorized representative who has indicated his/her understanding and acceptance.     Dental advisory given  Plan Discussed with: CRNA and Anesthesiologist  Anesthesia Plan Comments:        Anesthesia Quick Evaluation  Anesthesia Evaluation  Patient identified by MRN, date of birth, ID band Patient awake    Reviewed: Allergy & Precautions, H&P , NPO status , Patient's Chart, lab work & pertinent test results  Airway Mallampati: II  TM Distance: >3 FB Neck ROM: Full    Dental  (+) Poor Dentition, Chipped, Missing, Loose, Dental Advisory Given,  Front upper teeth permanent bridge:   Pulmonary former smoker,  Quit smoking 2012, 30 pack year history    Pulmonary exam normal breath sounds clear to auscultation       Cardiovascular hypertension, Pt. on medications Normal cardiovascular exam Rhythm:Regular Rate:Normal  ECHO 6/22 1. Left ventricular ejection fraction, by estimation, is 55 to 60%. The left ventricle has normal function. The left ventricle has no regional wall motion abnormalities. There is mild left ventricular hypertrophy. Left ventricular diastolic parameters were normal. 2. Right ventricular systolic function is normal. The right ventricular size  is normal. 3. The mitral valve is normal in structure. No evidence of mitral valve regurgitation. No evidence of mitral stenosis. 4. The aortic valve is tricuspid. There is mild calcification of the aortic valve. Aortic valve regurgitation is not visualized. Mild aortic valve sclerosis is present, with no evidence of aortic valve stenosis. 5. Aortic dilatation noted. There is mild dilatation of the ascending aorta, measuring 38 mm. 6. The inferior vena cava is normal in size with greater than 50% respiratory variability, suggesting right atrial pressure of 3 mmHg.   Neuro/Psych PSYCHIATRIC DISORDERS Anxiety Depression negative neurological ROS     GI/Hepatic negative GI ROS, Neg liver ROS,   Endo/Other  diabetes, Well Controlled, Type 2, Oral Hypoglycemic AgentsFS 99  Renal/GU   negative genitourinary   Musculoskeletal  (+) Arthritis , Osteoarthritis,  Spinal stenosis    Abdominal   Peds negative pediatric ROS (+)  Hematology  (+) Blood dyscrasia, anemia , H/H 7.7/23.5   Anesthesia Other Findings Bilateral infected shoulder  Reproductive/Obstetrics negative OB ROS                            Anesthesia Physical Anesthesia Plan  ASA: 3  Anesthesia Plan: General   Post-op Pain Management:    Induction: Intravenous  PONV Risk Score and Plan: 2 and Dexamethasone, Ondansetron, Midazolam and Treatment may vary due to age or medical condition  Airway Management Planned: Oral ETT  Additional Equipment: None  Intra-op Plan:   Post-operative Plan: Extubation in OR  Informed Consent: I have reviewed the patients History and Physical, chart, labs and discussed the procedure including the risks, benefits and alternatives for the proposed anesthesia with the patient or authorized representative who has indicated his/her understanding and acceptance.     Dental advisory given  Plan Discussed with: CRNA  Anesthesia Plan Comments:         Anesthesia Quick Evaluation  Anesthesia Physical Anesthesia Plan  ASA: 2  Anesthesia Plan: General   Post-op Pain Management:    Induction: Intravenous  PONV Risk Score and Plan: Ondansetron and Treatment may vary due to age or medical condition  Airway Management Planned: Oral ETT  Additional Equipment: None  Intra-op Plan:   Post-operative Plan: Extubation in OR  Informed Consent: I have reviewed the patients History and Physical, chart, labs and discussed the procedure including the risks, benefits and alternatives for the proposed anesthesia with the patient or authorized representative who has indicated his/her understanding and acceptance.     Dental advisory given  Plan Discussed  with: CRNA  Anesthesia Plan Comments:         Anesthesia Quick Evaluation

## 2021-02-28 NOTE — Transfer of Care (Signed)
Immediate Anesthesia Transfer of Care Note  Patient: John Ware  Procedure(s) Performed: IRRIGATION AND DEBRIDEMENT SHOULDER (Bilateral: Shoulder)  Patient Location: PACU  Anesthesia Type:General  Level of Consciousness: awake, alert , oriented and patient cooperative  Airway & Oxygen Therapy: Patient Spontanous Breathing and Patient connected to face mask oxygen  Post-op Assessment: Report given to RN and Post -op Vital signs reviewed and stable  Post vital signs: Reviewed and stable.  Last Vitals:  Vitals Value Taken Time  BP 173/99 02/28/21 1715  Temp    Pulse 74 02/28/21 1717  Resp 20 02/28/21 1717  SpO2 99 % 02/28/21 1717  Vitals shown include unvalidated device data.  Last Pain:  Vitals:   02/28/21 1405  TempSrc:   PainSc: 0-No pain      Patients Stated Pain Goal: 3 (02/28/21 1321)  Complications: No notable events documented.

## 2021-03-01 ENCOUNTER — Encounter (HOSPITAL_COMMUNITY): Payer: Self-pay | Admitting: Orthopaedic Surgery

## 2021-03-01 DIAGNOSIS — A419 Sepsis, unspecified organism: Secondary | ICD-10-CM | POA: Diagnosis not present

## 2021-03-01 DIAGNOSIS — R6520 Severe sepsis without septic shock: Secondary | ICD-10-CM | POA: Diagnosis not present

## 2021-03-01 LAB — CBC
HCT: 24.7 % — ABNORMAL LOW (ref 39.0–52.0)
Hemoglobin: 7.7 g/dL — ABNORMAL LOW (ref 13.0–17.0)
MCH: 28.6 pg (ref 26.0–34.0)
MCHC: 31.2 g/dL (ref 30.0–36.0)
MCV: 91.8 fL (ref 80.0–100.0)
Platelets: 616 10*3/uL — ABNORMAL HIGH (ref 150–400)
RBC: 2.69 MIL/uL — ABNORMAL LOW (ref 4.22–5.81)
RDW: 15.3 % (ref 11.5–15.5)
WBC: 10.7 10*3/uL — ABNORMAL HIGH (ref 4.0–10.5)
nRBC: 0 % (ref 0.0–0.2)

## 2021-03-01 LAB — BASIC METABOLIC PANEL
Anion gap: 7 (ref 5–15)
BUN: 8 mg/dL (ref 6–20)
CO2: 31 mmol/L (ref 22–32)
Calcium: 8 mg/dL — ABNORMAL LOW (ref 8.9–10.3)
Chloride: 96 mmol/L — ABNORMAL LOW (ref 98–111)
Creatinine, Ser: 0.33 mg/dL — ABNORMAL LOW (ref 0.61–1.24)
GFR, Estimated: >60 mL/min (ref >60)
Glucose, Bld: 103 mg/dL — ABNORMAL HIGH (ref 70–99)
Potassium: 4.1 mmol/L (ref 3.5–5.1)
Sodium: 134 mmol/L — ABNORMAL LOW (ref 135–145)

## 2021-03-01 LAB — GLUCOSE, CAPILLARY
Glucose-Capillary: 90 mg/dL (ref 70–99)
Glucose-Capillary: 176 mg/dL — ABNORMAL HIGH (ref 70–99)
Glucose-Capillary: 154 mg/dL — ABNORMAL HIGH (ref 70–99)
Glucose-Capillary: 124 mg/dL — ABNORMAL HIGH (ref 70–99)

## 2021-03-01 LAB — MAGNESIUM: Magnesium: 1.9 mg/dL (ref 1.7–2.4)

## 2021-03-01 MED ORDER — CEFAZOLIN SODIUM-DEXTROSE 2-4 GM/100ML-% IV SOLN
2.0000 g | Freq: Three times a day (TID) | INTRAVENOUS | Status: DC
  Administered 2021-03-01 – 2021-03-02 (×4): 2 g via INTRAVENOUS
  Filled 2021-03-01 (×6): qty 100

## 2021-03-01 MED ORDER — GABAPENTIN 100 MG PO CAPS
200.0000 mg | ORAL_CAPSULE | Freq: Two times a day (BID) | ORAL | Status: DC
  Administered 2021-03-02 – 2021-03-15 (×25): 200 mg via ORAL
  Filled 2021-03-01 (×25): qty 2

## 2021-03-01 NOTE — Progress Notes (Signed)
Physical Therapy Treatment Patient Details Name: John Ware MRN: [id number] DOB: [birth year] Today's Date: 03/01/2021    History of Present Illness 55 year old with history of chronic pain, HTN, DM2, COVID-positive at home 5/20 reporting of body pain, shoulder pain eventually came to the ER.  Found to have MSSA bacteremia started on IV Ancef.  Large fluid collection on the left lateral posterior chest and back was noted.  Underwent MRI T-spine and CT CAP showing large multiloculated effusion  and osteomyelitis of thoracic spine.  There was concerns for septic emboli but echo was negative for TEE performed on 6/13 showed large tricuspid vegetation.  General surgery and orthopedic consulted.  Underwent left shoulder debridement 02/08/2021.  Postop had increasing size of his abscess collection therefore general surgery performed repeat debridement with wound VAC placement.  Dressing management per surgical team.  CT maxillofacial showed oral lucencies therefore oral surgery consulted.  Oral surgery did not think this was acute and Low suspicion for source per ID.  Underwent repeat I&D by Ortho on 02/28/2021.    PT Comments    Pt sitting Indep EOB eating with spouse [name] and his mother in room.  Assisted with amb in hallway.  General Gait Details: decreased amb distance due to now using a straight cane vs a walker to minimize WBing thru B UE's.  Pt also present with increased gait instability and required x 3 standing rest breaks due to low back pain.  Pt requires 50% VC's on safety reguarding equipment(IV and multiple lines/leads/2 VACS) Assisted to bathroom seated on toilet.   Follow Up Recommendations        Equipment Recommendations  Rolling walker with 5" wheels (RW for community use)  Spouse plans to buy a cane  Recommendations for Other Services       Precautions / Restrictions Precautions Precautions: Fall Precaution Comments: pt has been WBAT B UE's with slings for "comfort" but has  not needed them.  New Ortho PN from 7/6 states B UE's NWB Restrictions Other Position/Activity Restrictions: awaiting clarification reguarding WBing    Mobility  Bed Mobility               General bed mobility comments: OOB eating    Transfers Overall transfer level: Needs assistance Equipment used: None;Straight cane Transfers: Sit to/from [employer] Transfers Sit to Stand: Supervision Stand pivot transfers: Supervision;Min guard       General transfer comment: requires assist for safety of multiple lines/tubes/leads  Ambulation/Gait Ambulation/Gait assistance: Min assist Gait Distance (Feet): 85 Feet Assistive device: Straight cane Gait Pattern/deviations: Step-through pattern;Decreased stride length;Trunk flexed Gait velocity: decreased   General Gait Details: decreased amb distance due to now using a straight cane vs a walker to minimize WBing thru B UE's.  Pt also present with increased gait instability and required x 3 standing rest breaks due to low back pain.  Pt requires 50% VC's on safety reguarding equipment(IV and multiple lines/leads/2 VACS)   Stairs             Wheelchair Mobility    Modified Rankin (Stroke Patients Only)       Balance                                            Cognition Arousal/Alertness: Awake/alert Behavior During Therapy: WFL for tasks assessed/performed;Restless Overall Cognitive Status: Within Functional Limits for tasks assessed  General Comments: AxO x 3 following all commands but still with need of intermittent redirection to focus on task at hand. Easily distracted. At times impulsive.      Exercises      General Comments        Pertinent Vitals/Pain Pain Assessment: Faces Faces Pain Scale: Hurts even more Pain Location: L low back Pain Descriptors / Indicators: Grimacing;Pounding;Aching Pain Intervention(s): Monitored during  session    Home Living                      Prior Function            PT Goals (current goals can now be found in the care plan section) Progress towards PT goals: Progressing toward goals    Frequency    Min 3X/week      PT Plan Current plan remains appropriate    Co-evaluation              AM-PAC PT "6 Clicks" Mobility   Outcome Measure  Help needed turning from your back to your side while in a flat bed without using bedrails?: A Little Help needed moving from lying on your back to sitting on the side of a flat bed without using bedrails?: A Little Help needed moving to and from a bed to a chair (including a wheelchair)?: A Little Help needed standing up from a chair using your arms (e.g., wheelchair or bedside chair)?: A Little Help needed to walk in hospital room?: A Little Help needed climbing 3-5 steps with a railing? : A Little 6 Click Score: 18    End of Session Equipment Utilized During Treatment: Gait belt Activity Tolerance: Patient tolerated treatment well Patient left: Other (comment) (bathroom) Nurse Communication: Mobility status PT Visit Diagnosis: Unsteadiness on feet (R26.81);History of falling (Z91.81);Difficulty in walking, not elsewhere classified (R26.2)     Time: 1950-9326 PT Time Calculation (min) (ACUTE ONLY): 29 min  Charges:  $Gait Training: 8-22 mins $Therapeutic Activity: 8-22 mins                     {Teala Daffron  PTA Acute  Rehabilitation Services Pager      [phone] Office      [phone]

## 2021-03-01 NOTE — Progress Notes (Addendum)
Patient refuses gabapentin d/t he thinks it affects his vision. Notified provider on call.

## 2021-03-01 NOTE — Progress Notes (Signed)
Subjective:  Alert and oriented. States pain is significantly worse at both shoulders, "feels like I'm back a week" when speaking about his shoulder motion. Denies radicular symptoms, but central line is causing severe pain at neck, worse than shoulders.   Objective:  PE: VITALS:   Vitals:   02/28/21 2118 03/01/21 0030 03/01/21 0402 03/01/21 0519  BP: 114/89   115/75  Pulse: 81   (!) 102  Resp: 18 13 20 20   Temp: 98.2 F (36.8 C)   98.8 F (37.1 C)  TempSrc: Oral   Oral  SpO2: 96% 100% 100% 100%  Weight:      Height:        MSK: No active forward flexion of right shoulder or left shoulder due to pain., Full ROM of bilateral elbows, wrist, and all fingers of hands. R incisional vac in place with good seal, less than 10 cc's R shoulder canister. L wound vac in place with good seal, 100 cc's in canister. Distal sensation and capillary refill intact. 5/5 grip strength bilaterally.   LABS  Results for orders placed or performed during the hospital encounter of 01/31/21 (from the past 24 hour(s))  Glucose, capillary     Status: Abnormal   Collection Time: 02/28/21  1:12 PM  Result Value Ref Range   Glucose-Capillary 119 (H) 70 - 99 mg/dL  Glucose, capillary     Status: Abnormal   Collection Time: 02/28/21  3:13 PM  Result Value Ref Range   Glucose-Capillary 105 (H) 70 - 99 mg/dL  Glucose, capillary     Status: None   Collection Time: 02/28/21  9:42 PM  Result Value Ref Range   Glucose-Capillary 99 70 - 99 mg/dL  Basic metabolic panel     Status: Abnormal   Collection Time: 03/01/21  3:34 AM  Result Value Ref Range   Sodium 134 (L) 135 - 145 mmol/L   Potassium 4.1 3.5 - 5.1 mmol/L   Chloride 96 (L) 98 - 111 mmol/L   CO2 31 22 - 32 mmol/L   Glucose, Bld 103 (H) 70 - 99 mg/dL   BUN 8 6 - 20 mg/dL   Creatinine, Ser 05/02/21 (L) 0.61 - 1.24 mg/dL   Calcium 8.0 (L) 8.9 - 10.3 mg/dL   GFR, Estimated 3.70 >48 mL/min   Anion gap 7 5 - 15  CBC     Status: Abnormal   Collection  Time: 03/01/21  3:34 AM  Result Value Ref Range   WBC 10.7 (H) 4.0 - 10.5 K/uL   RBC 2.69 (L) 4.22 - 5.81 MIL/uL   Hemoglobin 7.7 (L) 13.0 - 17.0 g/dL   HCT 05/02/21 (L) 91.6 - 94.5 %   MCV 91.8 80.0 - 100.0 fL   MCH 28.6 26.0 - 34.0 pg   MCHC 31.2 30.0 - 36.0 g/dL   RDW 03.8 88.2 - 80.0 %   Platelets 616 (H) 150 - 400 K/uL   nRBC 0.0 0.0 - 0.2 %  Magnesium     Status: None   Collection Time: 03/01/21  3:34 AM  Result Value Ref Range   Magnesium 1.9 1.7 - 2.4 mg/dL  Glucose, capillary     Status: None   Collection Time: 03/01/21  7:34 AM  Result Value Ref Range   Glucose-Capillary 90 70 - 99 mg/dL    No results found.  Assessment/Plan: Bilateral shoulder septic arthritis - L shoulder irrigation and debridement 6/16 - R shoulder irrigation and debridement with distal clavicle resection 6/17 -  bilateral shoulder irrigation and debridement with wound vac placement bilaterally 6/29 - bilateral shoulder irrigation and debridement with closure and incisional wound vac placement, hemovac placement R shoulder, L wound vac and hemovac placement 7/7 - IV antibiotics per ID - continue wound vacs and hemovacs, likely repeat I&D hopefully with closure of L shoulder early next week    Armida Sans 03/01/2021, 8:38 AM

## 2021-03-01 NOTE — Progress Notes (Signed)
Wasted 2.31 mL Dilaudid PCA in pyxis with Liberty Handy., RN

## 2021-03-01 NOTE — Progress Notes (Signed)
PROGRESS NOTE    John Ware  LHT:[id number] DOB: 21-Aug-1966 DOA: 01/31/2021 PCP: Junie Spencer, FNP   Brief Narrative:  55 year old with history of chronic pain, HTN, DM2, COVID-positive at home 5/20 reporting of body pain, shoulder pain eventually came to the ER.  Found to have MSSA bacteremia started on IV Ancef.  Large fluid collection on the left lateral posterior chest and back was noted.  Underwent MRI T-spine and CT CAP showing large multiloculated effusion  and osteomyelitis of thoracic spine.  There was concerns for septic emboli but echo was negative for TEE performed on 6/13 showed large tricuspid vegetation.  General surgery and orthopedic consulted.  Underwent left shoulder debridement 02/08/2021.  Postop had increasing size of his abscess collection therefore general surgery performed repeat debridement with wound VAC placement.  Dressing management per surgical team.  CT maxillofacial showed oral lucencies therefore oral surgery consulted.  Oral surgery did not think this was acute and Low suspicion for source per ID.  Underwent repeat I&D by Ortho on 02/28/2021.   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  Large left posterior wall and bilateral psoas muscle abscess, bilateral septic shoulder joint, osteomyelitis and discitis.  Also possible floaters but endopthalmitis was ruled out. - Left shoulder debridement 6/16, right shoulder debridement 6/17, drain placement by IR 6/24.   Status post bilateral shoulder debridement with wound VAC placement 6/29.  Pathology for necrotic lymph node -benign adipose tissue - Continue IV Ancef -ID following - PCA for pain control. Oxycodone 10mg  q4hrs prn,. -Repeat CT shoulder performed.  Status post repeat I&D 7/6.  May need to go back in again next week - CT maxillofacial shows dental lucencies.  Oral surgery, Dr 9/6 didn't think there is anything acute. Low suspicious for potential source for ID. Can follow up with Dentist outpatient.   Poor dentition with dental lucencies - Potential source.  Oral surgery, Dr Ross Marcus didn't think there is anything acute. Low suspicious for potential source for ID. Can follow up with Dentist outpatient.   Anemia of chronic disease.   -Oral iron supplements with bowel regimen.  Status post 2 units PRBC 6/30.  Closely monitor.   DKA.  Resolved.  Type 2 diabetes mellitus.   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   CBG within goal.  Gabapentin 400 mg 3 times daily -Hold off on Premeal coverage.  Lantus 20 units daily. - ISS   Ascending aorta dilatation (HCC) - TTE on 6/10 notes mild dilation of ascending aorta, 38 mm - follow TEE results as well: normal thoracic and ascending aorta noted on 02/05/21   Anxiety - Wellbutrin and Zoloft on hold for now in setting of hyponatremia   COVID-19 infection-resolved   DVT prophylaxis: Lovenox Code Status: Full Family Communication:    Status is: Inpatient  Remains inpatient appropriate because:Inpatient level of care appropriate due to severity of illness.  Maintain hospital stay until  cleared by orthopedic and general surgery.  Complex surgical case with multiple wounds requiring debridement  Dispo: The patient is from: Home              Anticipated d/c is to: SNF              Patient currently is not medically stable to d/c.   Difficult to place patient No     Body mass  index is 29.43 kg/m.  Pressure Injury 01/31/21 Buttocks Right;Left;Posterior Stage 2 -  Partial thickness loss of dermis presenting as a shallow open injury with a red, pink wound bed without slough. (Active)  01/31/21 1546  Location: Buttocks  Location Orientation: Right;Left;Posterior  Staging: Stage 2 -  Partial thickness loss of dermis presenting as a shallow open injury with a red, pink wound bed without slough.  Wound Description (Comments):   Present on Admission: Yes      Subjective: Patient is little tearful today as wound VAC had to be placed back on again and may require another OR visit next week.  No other complaints otherwise.  Feeling okay.  Tolerating orals, no acute events overnight.  Review of Systems Otherwise negative except as per HPI, including: General: Denies fever, chills, night sweats or unintended weight loss. Resp: Denies cough, wheezing, shortness of breath. Cardiac: Denies chest pain, palpitations, orthopnea, paroxysmal nocturnal dyspnea. GI: Denies abdominal pain, nausea, vomiting, diarrhea or constipation GU: Denies dysuria, frequency, hesitancy or incontinence MS: Denies muscle aches, joint pain or swelling Neuro: Denies headache, neurologic deficits (focal weakness, numbness, tingling), abnormal gait Psych: Denies anxiety, depression, SI/HI/AVH Skin: Denies new rashes or lesions ID: Denies sick contacts, exotic exposures, travel   Examination: Constitutional: Not in acute distress Respiratory: Clear to auscultation bilaterally Cardiovascular: Normal sinus rhythm, no rubs Abdomen: Nontender nondistended good bowel sounds Musculoskeletal: No edema noted Skin: No rashes seen Neurologic: CN 2-12 grossly intact.  And nonfocal Psychiatric: Normal judgment and insight. Alert and oriented x 3. Normal mood.  Multiple drains, wound VAC and skin dressings noted throughout his body   Objective: Vitals:   03/01/21 0030 03/01/21 0402 03/01/21 0519  03/01/21 0849  BP:   115/75   Pulse:   (!) 102   Resp: 13 20 20 13   Temp:   98.8 F (37.1 C)   TempSrc:   Oral   SpO2: 100% 100% 100% 97%  Weight:      Height:        Intake/Output Summary (Last 24 hours) at 03/01/2021 1120 Last data filed at 03/01/2021 1016 Gross per 24 hour  Intake 774.23 ml  Output 2613 ml  Net -1838.77 ml   Filed Weights   02/08/21 1540 02/20/21 1419 02/27/21 1443  Weight: 97.3 kg 97.3 kg 93 kg     Data Reviewed:   CBC: Recent Labs  Lab 02/24/21 0517 02/25/21 0431 02/26/21 0501 02/28/21 0753 03/01/21 0334  WBC 11.9* 10.9* 10.5 10.0 10.7*  HGB 9.2* 9.2* 9.8* 9.5* 7.7*  HCT 28.9* 29.3* 31.1* 28.7* 24.7*  MCV 92.0 93.0 92.0 88.3 91.8  PLT 823* 724* 844* 623* 616*   Basic Metabolic Panel: Recent Labs  Lab 02/24/21 0517 02/25/21 0431 02/26/21 0501 02/28/21 0753 03/01/21 0334  NA 132* 135 132* 131* 134*  K 4.2 4.3 4.2 4.6 4.1  CL 94* 96* 93* 93* 96*  CO2 29 30 30 26 31   GLUCOSE 126* 96 159* 119* 103*  BUN 6 6 6 10 8   CREATININE 0.47* 0.49* 0.46* 0.54* 0.33*  CALCIUM 8.3* 8.5* 8.7* 8.6* 8.0*  MG 1.8 1.8 1.8 1.7 1.9   GFR: Estimated Creatinine Clearance: 122.3 mL/min (A) (by C-G formula based on SCr of 0.33 mg/dL (L)). Liver Function Tests: No results for input(s): AST, ALT, ALKPHOS, BILITOT, PROT, ALBUMIN in the last 168 hours. No results for input(s): LIPASE, AMYLASE in the last 168 hours. No results for input(s): AMMONIA in the last 168 hours. Coagulation Profile: No results for input(s): INR, PROTIME in the last 168 hours. Cardiac Enzymes: No results for input(s): CKTOTAL, CKMB, CKMBINDEX, TROPONINI in the last 168 hours. BNP (last 3 results) No results for input(s): PROBNP in the last 8760 hours. HbA1C: No results for input(s): HGBA1C in the last 72 hours. CBG: Recent Labs  Lab 02/28/21 0734 02/28/21 1312 02/28/21 1513 02/28/21 2142 03/01/21 0734  GLUCAP 120* 119* 105* 99 90   Lipid Profile: No results for input(s):  CHOL, HDL, LDLCALC, TRIG, CHOLHDL, LDLDIRECT in the last 72 hours. Thyroid Function Tests: No results for input(s): TSH, T4TOTAL, FREET4, T3FREE, THYROIDAB in the last 72 hours. Anemia Panel: No results for input(s): VITAMINB12, FOLATE, FERRITIN, TIBC, IRON, RETICCTPCT in the last 72 hours.  Sepsis Labs: No results for input(s): PROCALCITON, LATICACIDVEN in the last 168 hours.  Recent Results (from the past 240 hour(s))  Aerobic/Anaerobic Culture w Gram Stain (surgical/deep wound)     Status: None (Preliminary result)   Collection Time: 02/28/21  4:11 PM   Specimen: PATH Soft tissue  Result Value Ref Range Status   Specimen Description   Final    TISSUE LEFT SHOULDER A Performed at [facility], [address], [place], [place] [address]    Special Requests   Final    NONE Performed at [facility], [address], [place], [place] [address]    Gram Stain PENDING  Incomplete   Culture   Final    NO GROWTH < 24 HOURS Performed at [facility], [address], [place], [place] [address]    Report Status PENDING  Incomplete  Aerobic/Anaerobic Culture w Gram Stain (surgical/deep wound)     Status: None (Preliminary result)   Collection Time: 02/28/21  4:12 PM   Specimen: PATH Soft tissue  Result Value Ref Range Status   Specimen Description   Final    TISSUE  RIGHT SHOULDER Performed at [facility], [address] [place], [place] [address]    Special Requests   Final    NONE Performed at [facility], [address] [place], [place] [address]    Gram Stain PENDING  Incomplete   Culture   Final    NO GROWTH < 24 HOURS Performed at [facility], [address] [place], [place] [address]    Report Status PENDING  Incomplete          Radiology Studies: No results found.      Scheduled Meds:  (feeding supplement) PROSource Plus  30 mL Oral TID BM   sodium chloride    Intravenous Once   acetaminophen  500 mg Oral QID   vitamin C  500 mg Oral BID   Chlorhexidine Gluconate Cloth  6 each Topical Daily   enoxaparin (LOVENOX) injection  40 mg Subcutaneous Q24H   ferrous sulfate  325 mg Oral BID WC   gabapentin  400 mg Oral TID   HYDROmorphone   Intravenous Q4H   insulin aspart  0-15 Units Subcutaneous TID WC   insulin  aspart  0-5 Units Subcutaneous QHS   insulin glargine  20 Units Subcutaneous Daily   mouth rinse  15 mL Mouth Rinse BID   nutrition supplement (JUVEN)  1 packet Oral BID BM   polyethylene glycol  17 g Oral Daily   senna-docusate  1 tablet Oral BID   sodium chloride flush  5 mL Intracatheter Q8H   zinc sulfate  220 mg Oral Daily   Continuous Infusions:  sodium chloride 250 mL (02/22/21 1738)    ceFAZolin (ANCEF) IV       LOS: 29 days   Time spent= 35 mins    Katlin Ciszewski Joline Maxcyhirag Javad Salva, MD [facility]  If 7PM-7AM, please contact night-coverage  03/01/2021, 11:20 AM

## 2021-03-01 NOTE — Plan of Care (Signed)

## 2021-03-02 DIAGNOSIS — A419 Sepsis, unspecified organism: Secondary | ICD-10-CM | POA: Diagnosis not present

## 2021-03-02 DIAGNOSIS — R6520 Severe sepsis without septic shock: Secondary | ICD-10-CM | POA: Diagnosis not present

## 2021-03-02 LAB — BASIC METABOLIC PANEL
Anion gap: 7 (ref 5–15)
BUN: 11 mg/dL (ref 6–20)
CO2: 31 mmol/L (ref 22–32)
Calcium: 8 mg/dL — ABNORMAL LOW (ref 8.9–10.3)
Chloride: 97 mmol/L — ABNORMAL LOW (ref 98–111)
Creatinine, Ser: 0.49 mg/dL — ABNORMAL LOW (ref 0.61–1.24)
GFR, Estimated: >60 mL/min (ref >60)
Glucose, Bld: 101 mg/dL — ABNORMAL HIGH (ref 70–99)
Potassium: 3.8 mmol/L (ref 3.5–5.1)
Sodium: 135 mmol/L (ref 135–145)

## 2021-03-02 LAB — CBC
HCT: 24 % — ABNORMAL LOW (ref 39.0–52.0)
Hemoglobin: 7.6 g/dL — ABNORMAL LOW (ref 13.0–17.0)
MCH: 29.1 pg (ref 26.0–34.0)
MCHC: 31.7 g/dL (ref 30.0–36.0)
MCV: 92 fL (ref 80.0–100.0)
Platelets: 588 10*3/uL — ABNORMAL HIGH (ref 150–400)
RBC: 2.61 MIL/uL — ABNORMAL LOW (ref 4.22–5.81)
RDW: 15.4 % (ref 11.5–15.5)
WBC: 9.1 10*3/uL (ref 4.0–10.5)
nRBC: 0 % (ref 0.0–0.2)

## 2021-03-02 LAB — GLUCOSE, CAPILLARY
Glucose-Capillary: 98 mg/dL (ref 70–99)
Glucose-Capillary: 118 mg/dL — ABNORMAL HIGH (ref 70–99)
Glucose-Capillary: 132 mg/dL — ABNORMAL HIGH (ref 70–99)
Glucose-Capillary: 153 mg/dL — ABNORMAL HIGH (ref 70–99)

## 2021-03-02 LAB — MAGNESIUM: Magnesium: 1.8 mg/dL (ref 1.7–2.4)

## 2021-03-02 MED ORDER — SODIUM CHLORIDE 0.9 % IV SOLN
2.0000 g | Freq: Three times a day (TID) | INTRAVENOUS | Status: DC
  Administered 2021-03-03 – 2021-03-04 (×6): 2 g via INTRAVENOUS
  Filled 2021-03-02 (×6): qty 2

## 2021-03-02 NOTE — Progress Notes (Signed)
Left Shoulder Wound Vac keeps alarming Blockage alert. Trouble shooting tactics unsuccessful. MD notified .Stated to apply wet to dry dressing. Pt refused stating that he won't allow that to be done while he is awake due to pain. Will notify MD.

## 2021-03-02 NOTE — Consult Note (Signed)
WOC Nurse Consult Note: Patient receiving care in [facility]. Reason for Consult: "Wound VAC won't stop beeping. Trouble shooting has not worked". Consult request entered by Marguerita Beards, rn I both spoke with Marijo Conception by phone and Secure Chat and explained the WOC nurses have not been involved with the [facility] for the shoulder(s) and that she needs to reach out to the surgical team and explain what is happening with the [facility], what message it is giving, and how long this has been occurring.  The surgical team needs to evaluate the situation and decide on actions to take. WOC nurse will not follow at this time.  Please re-consult the WOC team if needed.  Helmut Muster, RN, MSN, CWOCN, CNS-BC, pager [phone]

## 2021-03-02 NOTE — Progress Notes (Signed)
Subjective:  Sleeping on exam. Wakes up to speak with me and then returns to sleep. Ongoing bilateral shoulder pain, worse than prior to most recent surgery, he states shoulder and back pain are 50/50 in terms of what is bothering him most. No other complaints.  Objective:  PE: VITALS:   Vitals:   03/02/21 0004 03/02/21 0407 03/02/21 0542 03/02/21 0820  BP:   106/68   Pulse:   72   Resp: 14 17 20 18   Temp:   98.4 F (36.9 C)   TempSrc:      SpO2: 100% 92% 100% 95%  Weight:      Height:        MSK: No active forward flexion of right shoulder or left shoulder due to pain., Full ROM of bilateral elbows, wrist, and all fingers of hands. R incisional vac in place with good seal, no drainage in R shoulder canister. L wound vac in place with good seal, 10 cc's in canister. Distal sensation and capillary refill intact. 5/5 grip strength bilaterally.   LABS  Results for orders placed or performed during the hospital encounter of 01/31/21 (from the past 24 hour(s))  Glucose, capillary     Status: Abnormal   Collection Time: 03/01/21 12:04 PM  Result Value Ref Range   Glucose-Capillary 176 (H) 70 - 99 mg/dL  Glucose, capillary     Status: Abnormal   Collection Time: 03/01/21  4:32 PM  Result Value Ref Range   Glucose-Capillary 154 (H) 70 - 99 mg/dL  Glucose, capillary     Status: Abnormal   Collection Time: 03/01/21  9:31 PM  Result Value Ref Range   Glucose-Capillary 124 (H) 70 - 99 mg/dL  Basic metabolic panel     Status: Abnormal   Collection Time: 03/02/21  3:59 AM  Result Value Ref Range   Sodium 135 135 - 145 mmol/L   Potassium 3.8 3.5 - 5.1 mmol/L   Chloride 97 (L) 98 - 111 mmol/L   CO2 31 22 - 32 mmol/L   Glucose, Bld 101 (H) 70 - 99 mg/dL   BUN 11 6 - 20 mg/dL   Creatinine, Ser 05/03/21 (L) 0.61 - 1.24 mg/dL   Calcium 8.0 (L) 8.9 - 10.3 mg/dL   GFR, Estimated 7.82 >95 mL/min   Anion gap 7 5 - 15  CBC     Status: Abnormal   Collection Time: 03/02/21  3:59 AM   Result Value Ref Range   WBC 9.1 4.0 - 10.5 K/uL   RBC 2.61 (L) 4.22 - 5.81 MIL/uL   Hemoglobin 7.6 (L) 13.0 - 17.0 g/dL   HCT 05/03/21 (L) 13.0 - 86.5 %   MCV 92.0 80.0 - 100.0 fL   MCH 29.1 26.0 - 34.0 pg   MCHC 31.7 30.0 - 36.0 g/dL   RDW 78.4 69.6 - 29.5 %   Platelets 588 (H) 150 - 400 K/uL   nRBC 0.0 0.0 - 0.2 %  Magnesium     Status: None   Collection Time: 03/02/21  3:59 AM  Result Value Ref Range   Magnesium 1.8 1.7 - 2.4 mg/dL  Glucose, capillary     Status: None   Collection Time: 03/02/21  7:52 AM  Result Value Ref Range   Glucose-Capillary 98 70 - 99 mg/dL    No results found.  Assessment/Plan: Bilateral shoulder septic arthritis - L shoulder irrigation and debridement 6/16 - R shoulder irrigation and debridement with distal clavicle resection 6/17 - bilateral  shoulder irrigation and debridement with wound vac placement bilaterally 6/29 - bilateral shoulder irrigation and debridement with closure and incisional wound vac placement, hemovac placement R shoulder, L wound vac and hemovac placement 7/7 - IV antibiotics per ID - continue wound vacs and hemovacs, will plan to d/c hemovacs when drainage is <15 cc's for 2 days. Plan to repeat I&D of left shoulder early next week, Monday or Tuesday with Dr. Dion Saucier depending on OR availability   John Ware 03/02/2021, 8:56 AM

## 2021-03-02 NOTE — Progress Notes (Signed)
PROGRESS NOTE    John Ware  GUR:[id number] DOB: July 09, 1966 DOA: 01/31/2021 PCP: Junie Spencer, FNP   Brief Narrative:  55 year old with history of chronic pain, HTN, DM2, COVID-positive at home 5/20 reporting of body pain, shoulder pain eventually came to the ER.  Found to have MSSA bacteremia started on IV Ancef.  Large fluid collection on the left lateral posterior chest and back was noted.  Underwent MRI T-spine and CT CAP showing large multiloculated effusion  and osteomyelitis of thoracic spine.  There was concerns for septic emboli but echo was negative for TEE performed on 6/13 showed large tricuspid vegetation.  General surgery and orthopedic consulted.  Underwent left shoulder debridement 02/08/2021.  Postop had increasing size of his abscess collection therefore general surgery performed repeat debridement with wound VAC placement.  Dressing management per surgical team.  CT maxillofacial showed oral lucencies therefore oral surgery consulted.  Oral surgery did not think this was acute and Low suspicion for source per ID.  Underwent repeat I&D by Ortho on 02/28/2021.  Culture data is pending.   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  Large left posterior wall and bilateral psoas muscle abscess, bilateral septic shoulder joint, osteomyelitis and discitis.  Also possible floaters but endopthalmitis was ruled out. - Left shoulder debridement 6/16, right shoulder debridement 6/17, drain  placement by IR 6/24.  Status post bilateral shoulder debridement with wound VAC placement 6/29.  Pathology for necrotic lymph node -benign adipose tissue - IV Ancef.  ID following.  No end date planned at this time. - PCA for pain control. Oxycodone 10mg  q4hrs prn,. -Repeat CT shoulder performed.  Status post repeat I&D 7/6.  Another surgery next week for possible wound VAC removal and closure - CT maxillofacial shows dental lucencies.  Oral surgery, Dr 9/6 didn't think there is anything acute. Low suspicious for potential source for ID. Can follow up with Dentist outpatient.   Poor dentition with dental lucencies - Potential source.  Oral surgery, Dr Ross Marcus didn't think there is anything acute. Low suspicious for potential source for ID.  Follow-up outpatient dentist  Anemia of chronic disease.   -Oral iron supplements with bowel regimen.  Status post 2 units PRBC 6/30.  Closely monitor.   DKA.  Resolved.  Type 2 diabetes mellitus.   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   CBG within goal.  Reduced gabapentin as it is causing patient dizziness and slight blurry vision. -Hold off on Premeal coverage.  Lantus 20 units daily. - ISS   Ascending aorta dilatation (HCC) - TTE on 6/10 notes mild dilation of ascending aorta, 38 mm - follow TEE results as well: normal thoracic and ascending aorta noted on 02/05/21   Anxiety - Wellbutrin and Zoloft on hold for now in setting of hyponatremia   COVID-19 infection-resolved   DVT prophylaxis: Lovenox Code Status: Full Family Communication:    Status is: Inpatient  Remains inpatient appropriate because:Inpatient level of care appropriate due to severity of illness.  Maintain hospital stay until cleared by orthopedic and general surgery.  Complex surgical case with multiple wounds requiring debridement  Dispo: The patient is from: Home              Anticipated d/c is to: SNF               Patient currently is not medically stable to d/c.   Difficult to place patient No     Body mass index is 29.43 kg/m.  Pressure Injury 01/31/21 Buttocks Right;Left;Posterior Stage 2 -  Partial thickness loss of dermis presenting as a shallow open injury with a red, pink wound bed without slough. (Active)  01/31/21 1546  Location: Buttocks  Location Orientation: Right;Left;Posterior  Staging: Stage 2 -  Partial thickness loss of dermis presenting as a shallow open injury with a red, pink wound bed without slough.  Wound Description (Comments):   Present on Admission: Yes      Subjective: No acute events overnight.  Feels okay for now.  Pain is well controlled  Review of Systems Otherwise negative except as per HPI, including: General: Denies fever, chills, night sweats or unintended weight loss. Resp: Denies cough, wheezing, shortness of breath. Cardiac: Denies chest pain, palpitations, orthopnea, paroxysmal nocturnal dyspnea. GI: Denies abdominal pain, nausea, vomiting, diarrhea or constipation GU: Denies dysuria, frequency, hesitancy or incontinence MS: Denies muscle aches, joint pain or swelling Neuro: Denies headache, neurologic deficits (focal weakness, numbness, tingling), abnormal gait Psych: Denies anxiety, depression, SI/HI/AVH Skin: Denies new rashes or lesions ID: Denies sick contacts, exotic exposures, travel   Examination: Constitutional: Not in acute distress Respiratory: Clear to auscultation bilaterally Cardiovascular: Normal sinus rhythm, no rubs Abdomen: Nontender nondistended good bowel sounds Musculoskeletal: No edema noted Skin: No rashes seen Neurologic: CN 2-12 grossly intact.  And nonfocal Psychiatric: Normal judgment and insight. Alert and oriented x 3. Normal mood. Bilateral shoulder wound VAC in place.  Multiple dressings throughout the skin noted. Central line in his right neck  Objective: Vitals:   03/02/21 0004 03/02/21 0407  03/02/21 0542 03/02/21 0820  BP:   106/68   Pulse:   72   Resp: 14 17 20 18   Temp:   98.4 F (36.9 C)   TempSrc:      SpO2: 100% 92% 100% 95%  Weight:      Height:        Intake/Output Summary (Last 24 hours) at 03/02/2021 1131 Last data filed at 03/02/2021 0900 Gross per 24 hour  Intake 790 ml  Output 2490 ml  Net -1700 ml   Filed Weights   02/08/21 1540 02/20/21 1419 02/27/21 1443  Weight: 97.3 kg 97.3 kg 93 kg     Data Reviewed:   CBC: Recent Labs  Lab 02/25/21 0431 02/26/21 0501 02/28/21 0753 03/01/21 0334 03/02/21 0359  WBC 10.9* 10.5 10.0 10.7* 9.1  HGB 9.2* 9.8* 9.5* 7.7* 7.6*  HCT 29.3* 31.1* 28.7* 24.7* 24.0*  MCV 93.0 92.0 88.3 91.8 92.0  PLT 724* 844* 623* 616* 588*   Basic Metabolic Panel: Recent Labs  Lab 02/25/21 0431 02/26/21 0501 02/28/21 0753 03/01/21 0334 03/02/21 0359  NA 135 132* 131* 134* 135  K 4.3 4.2 4.6 4.1 3.8  CL 96* 93* 93* 96* 97*  CO2 30 30 26 31 31   GLUCOSE 96 159* 119* 103* 101*  BUN 6 6 10 8 11   CREATININE 0.49* 0.46* 0.54* 0.33* 0.49*  CALCIUM  8.5* 8.7* 8.6* 8.0* 8.0*  MG 1.8 1.8 1.7 1.9 1.8   GFR: Estimated Creatinine Clearance: 122.3 mL/min (A) (by C-G formula based on SCr of 0.49 mg/dL (L)). Liver Function Tests: No results for input(s): AST, ALT, ALKPHOS, BILITOT, PROT, ALBUMIN in the last 168 hours. No results for input(s): LIPASE, AMYLASE in the last 168 hours. No results for input(s): AMMONIA in the last 168 hours. Coagulation Profile: No results for input(s): INR, PROTIME in the last 168 hours. Cardiac Enzymes: No results for input(s): CKTOTAL, CKMB, CKMBINDEX, TROPONINI in the last 168 hours. BNP (last 3 results) No results for input(s): PROBNP in the last 8760 hours. HbA1C: No results for input(s): HGBA1C in the last 72 hours. CBG: Recent Labs  Lab 03/01/21 1204 03/01/21 1632 03/01/21 2131 03/02/21 0752 03/02/21 1122  GLUCAP 176* 154* 124* 98 118*   Lipid Profile: No results for input(s): CHOL,  HDL, LDLCALC, TRIG, CHOLHDL, LDLDIRECT in the last 72 hours. Thyroid Function Tests: No results for input(s): TSH, T4TOTAL, FREET4, T3FREE, THYROIDAB in the last 72 hours. Anemia Panel: No results for input(s): VITAMINB12, FOLATE, FERRITIN, TIBC, IRON, RETICCTPCT in the last 72 hours.  Sepsis Labs: No results for input(s): PROCALCITON, LATICACIDVEN in the last 168 hours.  Recent Results (from the past 240 hour(s))  Aerobic/Anaerobic Culture w Gram Stain (surgical/deep wound)     Status: None (Preliminary result)   Collection Time: 02/28/21  4:11 PM   Specimen: PATH Soft tissue  Result Value Ref Range Status   Specimen Description   Final    TISSUE LEFT SHOULDER A Performed at [facility], [address], [place], [place] [address]    Special Requests   Final    NONE Performed at [facility], [address] [place], [place] [address]    Gram Stain   Final    RARE WBC PRESENT, PREDOMINANTLY PMN NO ORGANISMS SEEN    Culture   Final    CULTURE REINCUBATED FOR BETTER GROWTH Performed at [facility], [address] [place], [place] [address]    Report Status PENDING  Incomplete  Aerobic/Anaerobic Culture w Gram Stain (surgical/deep wound)     Status: None (Preliminary result)   Collection Time: 02/28/21  4:12 PM   Specimen: PATH Soft tissue  Result Value Ref Range Status   Specimen Description   Final    TISSUE  RIGHT SHOULDER Performed at [facility], [address], [place], [place] [address]    Special Requests   Final    NONE Performed at [facility], [address] [place], [place] [address]    Gram Stain NO WBC SEEN NO ORGANISMS SEEN   Final   Culture   Final    NO GROWTH 2 DAYS Performed at [facility], [address], [place], [place] [address]    Report Status PENDING  Incomplete          Radiology Studies: No results found.      Scheduled Meds:   (feeding supplement) PROSource Plus  30 mL Oral TID BM   sodium chloride   Intravenous Once   acetaminophen  500 mg Oral QID   vitamin C  500 mg Oral BID   Chlorhexidine Gluconate Cloth  6 each Topical Daily   enoxaparin (LOVENOX) injection  40 mg Subcutaneous Q24H   ferrous sulfate  325 mg Oral BID WC   gabapentin  200 mg Oral BID   HYDROmorphone  Intravenous Q4H   insulin aspart  0-15 Units Subcutaneous TID WC   insulin aspart  0-5 Units Subcutaneous QHS   insulin glargine  20 Units Subcutaneous Daily   mouth rinse  15 mL Mouth Rinse BID   nutrition supplement (JUVEN)  1 packet Oral BID BM   polyethylene glycol  17 g Oral Daily   senna-docusate  1 tablet Oral BID   sodium chloride flush  5 mL Intracatheter Q8H   zinc sulfate  220 mg Oral Daily   Continuous Infusions:  sodium chloride 250 mL (03/02/21 0108)    ceFAZolin (ANCEF) IV 2 g (03/02/21 0822)     LOS: 30 days   Time spent= 35 mins    Ilham Roughton Joline Maxcy, MD [facility]  If 7PM-7AM, please contact night-coverage  03/02/2021, 11:31 AM

## 2021-03-02 NOTE — Progress Notes (Signed)
Attempted to exchange out wound VAC ,but error messages still occur. Will notify MD.

## 2021-03-02 NOTE — Plan of Care (Signed)

## 2021-03-02 NOTE — TOC Progression Note (Addendum)
Transition of Care [facility]) - Progression Note    Patient Details  Name: John Ware MRN: [id number] Date of Birth: 21-Aug-1966  Transition of [facility]) CM/SW Contact  Darleene Cleaver, [place] Phone Number: 03/02/2021, 11:54 AM  Clinical Narrative:     CSW reviewed patient's chart, and asked [facility] to review patient's information to see if he would be a good candidate.  Per [facility], he would be a good candidate after he has surgery next week.  CSW updated attending physcian.  CSW continuing to follow patient's progress throughout discharge planning.    Expected Discharge Plan: Skilled Nursing Facility Barriers to Discharge: Continued Medical Work up  Expected Discharge Plan and Services Expected Discharge Plan: Skilled Nursing Facility   Discharge Planning Services: CM Consult   Living arrangements for the past 2 months: Single Family Home                                       Social Determinants of Health (SDOH) Interventions    Readmission Risk Interventions Readmission Risk Prevention Plan 02/16/2021  Transportation Screening Complete  PCP or Specialist Appt within 3-5 Days Complete  HRI or Home Care Consult Complete  Social Work Consult for Recovery Care Planning/Counseling Complete  Palliative Care Screening Complete  Medication Review Oceanographer) Complete  Some recent data might be hidden

## 2021-03-02 NOTE — Progress Notes (Signed)
Occupational Therapy Treatment Patient Details Name: John Ware MRN: [id number] DOB: Jul 05, 1966 Today's Date: 03/02/2021    History of present illness 55 year old with history of chronic pain, HTN, DM2, COVID-positive at home 5/20 reporting of body pain, shoulder pain eventually came to the ER.  Found to have MSSA bacteremia started on IV Ancef.  Large fluid collection on the left lateral posterior chest and back was noted.  Underwent MRI T-spine and CT CAP showing large multiloculated effusion  and osteomyelitis of thoracic spine.  There was concerns for septic emboli but echo was negative for TEE performed on 6/13 showed large tricuspid vegetation.  General surgery and orthopedic consulted.  Underwent left shoulder debridement 02/08/2021.  Postop had increasing size of his abscess collection therefore general surgery performed repeat debridement with wound VAC placement.  Dressing management per surgical team.  CT maxillofacial showed oral lucencies therefore oral surgery consulted.  Oral surgery did not think this was acute and Low suspicion for source per ID.  Underwent repeat I&D by Ortho on 02/28/2021.   OT comments  Treatment focused on exercises/active ROM of bilateral upper extremities to maintain joint mobility and strength and activity tolerance. Patient reports both shoulders are moving less since the I & D and having more pain but tolerated PROM to 20 degrees on left and PROM to 45 degress on the right. Patient ambulated length of hall holding on to IV pole with initially one hand then the other. Needing standing rest break at end of hall.  Patient doing well. Another I & D is planned for next well. Will continue to follow.    Follow Up Recommendations  Home health OT    Equipment Recommendations  3 in 1 bedside commode;Tub/shower seat    Recommendations for Other Services      Precautions / Restrictions Precautions Precautions: Fall Precaution Comments: pt has been WBAT B UE's  with slings for "comfort" but has not needed them.  New Ortho PN from 7/6 states B UE's NWB Restrictions Weight Bearing Restrictions: No       Mobility Bed Mobility               General bed mobility comments: OOB in recliner    Transfers Overall transfer level: Needs assistance   Transfers: Sit to/from Stand Sit to Stand: Min guard         General transfer comment: Min guard to stand from recliner. Ambulated full length of hall steadying himself on IV pole. Initally with just right hand on pole and then placed left hand as well. No overt loss of balance. Needed standing rest break at the end of the hall before returning.    Balance Overall balance assessment: Mild deficits observed, not formally tested                                         ADL either performed or assessed with clinical judgement   ADL                                               Vision Patient Visual Report: No change from baseline     Perception     Praxis      Cognition Arousal/Alertness: Awake/alert Behavior During Therapy: WFL for tasks assessed/performed;Restless Overall  Cognitive Status: Within Functional Limits for tasks assessed                                          Exercises Other Exercises Other Exercises: R shoulder: external rotation reps x 10, active assist forward flexion to approx 45 degrees x 10,active  elbow flexion x10, fist pumps x 10, wrist extension x 10 Other Exercises: L shoulder: external rotation reps x 10, active assist to approx 20 degrees forward flexion x 10, active elbow flexion x 10   Shoulder Instructions       General Comments      Pertinent Vitals/ Pain       Pain Assessment: Faces Faces Pain Scale: Hurts little more Pain Location: L low back Pain Descriptors / Indicators: Grimacing Pain Intervention(s): PCA encouraged;Monitored during session;Limited activity within patient's  tolerance  Home Living                                          Prior Functioning/Environment              Frequency           Progress Toward Goals  OT Goals(current goals can now be found in the care plan section)  Progress towards OT goals: Progressing toward goals  Acute Rehab OT Goals Patient Stated Goal: to go home at discharge OT Goal Formulation: With patient Time For Goal Achievement: 03/12/21 Potential to Achieve Goals: Good  Plan Discharge plan remains appropriate    Co-evaluation          OT goals addressed during session: Strengthening/ROM (activity tolerance)      AM-PAC OT "6 Clicks" Daily Activity     Outcome Measure   Help from another person eating meals?: A Little Help from another person taking care of personal grooming?: A Little Help from another person toileting, which includes using toliet, bedpan, or urinal?: A Lot Help from another person bathing (including washing, rinsing, drying)?: A Lot Help from another person to put on and taking off regular upper body clothing?: A Lot Help from another person to put on and taking off regular lower body clothing?: A Lot 6 Click Score: 14    End of Session    OT Visit Diagnosis: Unsteadiness on feet (R26.81);Other abnormalities of gait and mobility (R26.89);History of falling (Z91.81);Muscle weakness (generalized) (M62.81);Pain   Activity Tolerance Patient tolerated treatment well   Patient Left in chair;with call bell/phone within reach   Nurse Communication Mobility status        Time: 1450-1519 OT Time Calculation (min): 29 min  Charges: OT General Charges $OT Visit: 1 Visit OT Treatments $Therapeutic Activity: 8-22 mins $Therapeutic Exercise: 8-22 mins  Waldron Session, OTR/L Acute Care Rehab Services  Office [phone] Pager: [phone]    Kelli Churn 03/02/2021, 4:23 PM

## 2021-03-03 LAB — BASIC METABOLIC PANEL
Anion gap: 8 (ref 5–15)
BUN: 14 mg/dL (ref 6–20)
CO2: 30 mmol/L (ref 22–32)
Calcium: 8.3 mg/dL — ABNORMAL LOW (ref 8.9–10.3)
Chloride: 98 mmol/L (ref 98–111)
Creatinine, Ser: 0.53 mg/dL — ABNORMAL LOW (ref 0.61–1.24)
GFR, Estimated: >60 mL/min (ref >60)
Glucose, Bld: 97 mg/dL (ref 70–99)
Potassium: 3.9 mmol/L (ref 3.5–5.1)
Sodium: 136 mmol/L (ref 135–145)

## 2021-03-03 LAB — GLUCOSE, CAPILLARY
Glucose-Capillary: 104 mg/dL — ABNORMAL HIGH (ref 70–99)
Glucose-Capillary: 131 mg/dL — ABNORMAL HIGH (ref 70–99)
Glucose-Capillary: 109 mg/dL — ABNORMAL HIGH (ref 70–99)
Glucose-Capillary: 215 mg/dL — ABNORMAL HIGH (ref 70–99)

## 2021-03-03 LAB — CBC
HCT: 24.2 % — ABNORMAL LOW (ref 39.0–52.0)
Hemoglobin: 7.6 g/dL — ABNORMAL LOW (ref 13.0–17.0)
MCH: 28.6 pg (ref 26.0–34.0)
MCHC: 31.4 g/dL (ref 30.0–36.0)
MCV: 91 fL (ref 80.0–100.0)
Platelets: 577 10*3/uL — ABNORMAL HIGH (ref 150–400)
RBC: 2.66 MIL/uL — ABNORMAL LOW (ref 4.22–5.81)
RDW: 15.3 % (ref 11.5–15.5)
WBC: 9.4 10*3/uL (ref 4.0–10.5)
nRBC: 0 % (ref 0.0–0.2)

## 2021-03-03 LAB — MAGNESIUM: Magnesium: 1.9 mg/dL (ref 1.7–2.4)

## 2021-03-03 MED ORDER — BUPROPION HCL ER (XL) 150 MG PO TB24
150.0000 mg | ORAL_TABLET | Freq: Two times a day (BID) | ORAL | Status: DC
  Administered 2021-03-03 – 2021-03-15 (×23): 150 mg via ORAL
  Filled 2021-03-03 (×23): qty 1

## 2021-03-03 NOTE — Progress Notes (Signed)
PROGRESS NOTE    John Ware  MWN:[id number] DOB: December 13, 1965 DOA: 01/31/2021 PCP: Junie Spencer, FNP   Brief Narrative:  55 year old with history of chronic pain, HTN, DM2, COVID-positive at home 5/20 reporting of body pain, shoulder pain eventually came to the ER.  Found to have MSSA bacteremia started on IV Ancef.  Large fluid collection on the left lateral posterior chest and back was noted.  Underwent MRI T-spine and CT CAP showing large multiloculated effusion  and osteomyelitis of thoracic spine.  There was concerns for septic emboli but echo was negative for TEE performed on 6/13 showed large tricuspid vegetation.  General surgery and orthopedic consulted.  Underwent left shoulder debridement 02/08/2021.  Postop had increasing size of his abscess collection therefore general surgery performed repeat debridement with wound VAC placement.  Dressing management per surgical team.  CT maxillofacial showed oral lucencies therefore oral surgery consulted.  Oral surgery did not think this was acute and Low suspicion for source per ID.  Underwent repeat I&D by Ortho on 02/28/2021.  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  Large left posterior wall and bilateral psoas muscle abscess, bilateral septic shoulder joint, osteomyelitis and discitis.  Also possible floaters but endopthalmitis was ruled out. - Left shoulder debridement 6/16, right shoulder debridement 6/17, drain  placement by IR 6/24.  Status post bilateral shoulder debridement with wound VAC placement 6/29.  Pathology for necrotic lymph node -benign adipose tissue - IV Ancef.  ID following.  No end date planned at this time. - PCA for pain control. Oxycodone 10mg  q4hrs prn,. -Repeat CT shoulder performed.  Status post repeat I&D 7/6.  Another surgery next week for possible wound VAC removal and closure - CT maxillofacial shows dental lucencies.  Oral surgery, Dr 9/6 didn't think there is anything acute. Low suspicious for potential source for ID. Can follow up with Dentist outpatient.   Essential hypertension: Takes amlodipine and lisinopril at home.  Both on hold.  Blood pressure controlled.  Poor dentition with dental lucencies Oral surgery, Dr Ross Marcus didn't think there is anything acute. Low suspicious for potential source for ID.  Follow-up outpatient dentist  Anemia of chronic disease.   -Oral iron supplements with bowel regimen.  Status post 2 units PRBC 6/30.  Hemoglobin is stable over 7.  Closely monitor.   DKA.  Resolved.  Type 2 diabetes mellitus.   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   CBG within goal.  Continue reduced dose of gabapentin. Continue Lantus 20 units daily and SSI.   Ascending aorta dilatation (HCC) - TTE on 6/10 notes mild dilation of ascending aorta, 38 mm - follow TEE results as well: normal thoracic and ascending aorta noted on 02/05/21   Anxiety - Wellbutrin and Zoloft on hold in setting of hyponatremia however sodium normal so I will resume Wellbutrin.   COVID-19 infection-resolved  DVT prophylaxis: Lovenox Code Status:  Full Family Communication:    Status is: Inpatient  Remains inpatient appropriate because:Inpatient level of care appropriate due to severity of illness.  Maintain hospital stay until cleared by orthopedic and general surgery.  Complex surgical case with multiple wounds  requiring debridement  Dispo: The patient is from: Home              Anticipated d/c is to: SNF              Patient currently is not medically stable to d/c.   Difficult to place patient No     Body mass index is 29.43 kg/m.  Pressure Injury 01/31/21 Buttocks Right;Left;Posterior Stage 2 -  Partial thickness loss of dermis presenting as a shallow open injury with a red, pink wound bed without slough. (Active)  01/31/21 1546  Location: Buttocks  Location Orientation: Right;Left;Posterior  Staging: Stage 2 -  Partial thickness loss of dermis presenting as a shallow open injury with a red, pink wound bed without slough.  Wound Description (Comments):   Present on Admission: Yes      Subjective: Patient seen and examined.  No new complaint.  Pain controlled with PCA pump.  Examination: General exam: Appears calm and comfortable  Respiratory system: Clear to auscultation. Respiratory effort normal. Cardiovascular system: S1 & S2 heard, RRR. No JVD, murmurs, rubs, gallops or clicks. No pedal edema. Gastrointestinal system: Abdomen is nondistended, soft and nontender. No organomegaly or masses felt. Normal bowel sounds heard. Central nervous system: Alert and oriented. No focal neurological deficits. Extremities: Symmetric 5 x 5 power.  Wound VAC attached to both shoulders. Skin: No rashes, lesions or ulcers.  Psychiatry: Judgement and insight appear normal. Mood & affect appropriate.    Objective: Vitals:   03/03/21 0012 03/03/21 0433 03/03/21 0448 03/03/21 0849  BP:   (!) 137/91   Pulse:   73   Resp: 18 17 18 16   Temp:   98.4 F (36.9 C)   TempSrc:   Oral   SpO2: 96% 99% 100% 100%  Weight:      Height:        Intake/Output Summary (Last 24 hours) at 03/03/2021 1149 Last data filed at 03/03/2021 0814 Gross per 24 hour  Intake 1314.88 ml  Output 2765 ml  Net -1450.12 ml    Filed Weights   02/08/21 1540 02/20/21 1419 02/27/21 1443  Weight: 97.3 kg 97.3 kg 93 kg      Data Reviewed:   CBC: Recent Labs  Lab 02/26/21 0501 02/28/21 0753 03/01/21 0334 03/02/21 0359 03/03/21 0446  WBC 10.5 10.0 10.7* 9.1 9.4  HGB 9.8* 9.5* 7.7* 7.6* 7.6*  HCT 31.1* 28.7* 24.7* 24.0* 24.2*  MCV 92.0 88.3 91.8 92.0 91.0  PLT 844* 623* 616* 588* 577*    Basic Metabolic Panel: Recent Labs  Lab 02/26/21 0501 02/28/21 0753 03/01/21 0334 03/02/21 0359 03/03/21 0446  NA 132* 131* 134* 135 136  K 4.2 4.6 4.1 3.8 3.9  CL 93* 93* 96* 97* 98  CO2 30 26 31 31 30   GLUCOSE 159* 119* 103* 101* 97  BUN 6 10 8 11 14   CREATININE 0.46* 0.54* 0.33* 0.49* 0.53*  CALCIUM 8.7* 8.6* 8.0* 8.0* 8.3*  MG 1.8 1.7 1.9 1.8 1.9    GFR: Estimated Creatinine Clearance: 122.3 mL/min (A) (by C-G formula based on SCr of 0.53 mg/dL (L)). Liver Function Tests: No results for input(s): AST, ALT, ALKPHOS, BILITOT, PROT, ALBUMIN in the last 168 hours. No results for input(s): LIPASE, AMYLASE  in the last 168 hours. No results for input(s): AMMONIA in the last 168 hours. Coagulation Profile: No results for input(s): INR, PROTIME in the last 168 hours. Cardiac Enzymes: No results for input(s): CKTOTAL, CKMB, CKMBINDEX, TROPONINI in the last 168 hours. BNP (last 3 results) No results for input(s): PROBNP in the last 8760 hours. HbA1C: No results for input(s): HGBA1C in the last 72 hours. CBG: Recent Labs  Lab 03/02/21 1122 03/02/21 1640 03/02/21 2058 03/03/21 0811 03/03/21 1138  GLUCAP 118* 132* 153* 104* 131*    Lipid Profile: No results for input(s): CHOL, HDL, LDLCALC, TRIG, CHOLHDL, LDLDIRECT in the last 72 hours. Thyroid Function Tests: No results for input(s): TSH, T4TOTAL, FREET4, T3FREE, THYROIDAB in the last 72 hours. Anemia Panel: No results for input(s): VITAMINB12, FOLATE, FERRITIN, TIBC, IRON, RETICCTPCT in the last 72 hours.  Sepsis Labs: No results for input(s): PROCALCITON, LATICACIDVEN in the last 168 hours.  Recent Results (from the past 240 hour(s))   Aerobic/Anaerobic Culture w Gram Stain (surgical/deep wound)     Status: None (Preliminary result)   Collection Time: 02/28/21  4:11 PM   Specimen: PATH Soft tissue  Result Value Ref Range Status   Specimen Description   Final    TISSUE LEFT SHOULDER A Performed at [facility], [address] [place], [place] [address]    Special Requests   Final    NONE Performed at [facility], [address] [place], [place] [address]    Gram Stain   Final    RARE WBC PRESENT, PREDOMINANTLY PMN NO ORGANISMS SEEN Performed at [facility], [address] [place], [place] [address]    Culture   Final    CULTURE REINCUBATED FOR BETTER GROWTH NO ANAEROBES ISOLATED; CULTURE IN PROGRESS FOR 5 DAYS    Report Status PENDING  Incomplete  Aerobic/Anaerobic Culture w Gram Stain (surgical/deep wound)     Status: None (Preliminary result)   Collection Time: 02/28/21  4:12 PM   Specimen: PATH Soft tissue  Result Value Ref Range Status   Specimen Description   Final    TISSUE  RIGHT SHOULDER Performed at [facility], [address] [place], [place] [address]    Special Requests   Final    NONE Performed at [facility], [address], [place], [place] [address]    Gram Stain NO WBC SEEN NO ORGANISMS SEEN   Final   Culture   Final    NO GROWTH 2 DAYS NO ANAEROBES ISOLATED; CULTURE IN PROGRESS FOR 5 DAYS Performed at [facility], [address], [place], [place] [address]    Report Status PENDING  Incomplete          Radiology Studies: No results found.      Scheduled Meds:  (feeding supplement) PROSource Plus  30 mL Oral TID BM   sodium chloride   Intravenous Once   acetaminophen  500 mg Oral QID   vitamin C  500 mg Oral BID   ceFAZolin (ANCEF) IVPB 2 gram/100 mL NS (Mini-Bag Plus)  2 g Intravenous Q8H   Chlorhexidine Gluconate Cloth  6 each Topical Daily   enoxaparin (LOVENOX)  injection  40 mg Subcutaneous Q24H   ferrous sulfate  325 mg Oral BID WC   gabapentin  200 mg Oral BID   HYDROmorphone   Intravenous Q4H   insulin aspart  0-15 Units Subcutaneous TID WC   insulin aspart  0-5 Units  Subcutaneous QHS   insulin glargine  20 Units Subcutaneous Daily   mouth rinse  15 mL Mouth Rinse BID   nutrition supplement (JUVEN)  1 packet Oral BID BM   polyethylene glycol  17 g Oral Daily   senna-docusate  1 tablet Oral BID   sodium chloride flush  5 mL Intracatheter Q8H   zinc sulfate  220 mg Oral Daily   Continuous Infusions:  sodium chloride 250 mL (03/02/21 0108)     LOS: 31 days   Time spent= 37 mins  Hughie Closs, MD [facility]  If 7PM-7AM, please contact night-coverage  03/03/2021, 11:49 AM

## 2021-03-04 LAB — GLUCOSE, CAPILLARY
Glucose-Capillary: 118 mg/dL — ABNORMAL HIGH (ref 70–99)
Glucose-Capillary: 123 mg/dL — ABNORMAL HIGH (ref 70–99)
Glucose-Capillary: 91 mg/dL (ref 70–99)
Glucose-Capillary: 160 mg/dL — ABNORMAL HIGH (ref 70–99)

## 2021-03-04 LAB — CBC
HCT: 24.5 % — ABNORMAL LOW (ref 39.0–52.0)
Hemoglobin: 7.5 g/dL — ABNORMAL LOW (ref 13.0–17.0)
MCH: 27.8 pg (ref 26.0–34.0)
MCHC: 30.6 g/dL (ref 30.0–36.0)
MCV: 90.7 fL (ref 80.0–100.0)
Platelets: 569 10*3/uL — ABNORMAL HIGH (ref 150–400)
RBC: 2.7 MIL/uL — ABNORMAL LOW (ref 4.22–5.81)
RDW: 15.5 % (ref 11.5–15.5)
WBC: 11.5 10*3/uL — ABNORMAL HIGH (ref 4.0–10.5)
nRBC: 0 % (ref 0.0–0.2)

## 2021-03-04 LAB — MAGNESIUM: Magnesium: 1.9 mg/dL (ref 1.7–2.4)

## 2021-03-04 LAB — BASIC METABOLIC PANEL
Anion gap: 8 (ref 5–15)
BUN: 14 mg/dL (ref 6–20)
CO2: 29 mmol/L (ref 22–32)
Calcium: 8.2 mg/dL — ABNORMAL LOW (ref 8.9–10.3)
Chloride: 97 mmol/L — ABNORMAL LOW (ref 98–111)
Creatinine, Ser: 0.45 mg/dL — ABNORMAL LOW (ref 0.61–1.24)
GFR, Estimated: >60 mL/min (ref >60)
Glucose, Bld: 116 mg/dL — ABNORMAL HIGH (ref 70–99)
Potassium: 3.8 mmol/L (ref 3.5–5.1)
Sodium: 134 mmol/L — ABNORMAL LOW (ref 135–145)

## 2021-03-04 MED ORDER — SODIUM CHLORIDE 0.9 % IV SOLN
2.0000 g | Freq: Three times a day (TID) | INTRAVENOUS | Status: DC
  Administered 2021-03-04 – 2021-03-15 (×31): 2 g via INTRAVENOUS
  Filled 2021-03-04 (×32): qty 2

## 2021-03-04 NOTE — Progress Notes (Signed)
PROGRESS NOTE    John Ware  CBJ:[id number] DOB: [birth year] DOA: 01/31/2021 PCP: Junie Spencer, FNP   Brief Narrative:  55 year old with history of chronic pain, HTN, DM2, COVID-positive at home 5/20 reporting of body pain, shoulder pain eventually came to the ER.  Found to have MSSA bacteremia started on IV Ancef.  Large fluid collection on the left lateral posterior chest and back was noted.  Underwent MRI T-spine and CT CAP showing large multiloculated effusion  and osteomyelitis of thoracic spine.  There was concerns for septic emboli but echo was negative for TEE performed on 6/13 showed large tricuspid vegetation.  General surgery and orthopedic consulted.  Underwent left shoulder debridement 02/08/2021.  Postop had increasing size of his abscess collection therefore general surgery performed repeat debridement with wound VAC placement.  Dressing management per surgical team.  CT maxillofacial showed oral lucencies therefore oral surgery consulted.  Oral surgery did not think this was acute and Low suspicion for source per ID.  Underwent repeat I&D by Ortho on 02/28/2021.  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  Large left posterior wall and bilateral psoas muscle abscess, bilateral septic shoulder joint, osteomyelitis and discitis.  Also possible floaters but endopthalmitis was ruled out. - Left shoulder debridement 6/16, right shoulder debridement 6/17, drain  placement by IR 6/24.  Status post bilateral shoulder debridement with wound VAC placement 6/29.  Pathology for necrotic lymph node -benign adipose tissue - IV Ancef.  ID following.  No end date planned at this time. - PCA for pain control. Oxycodone 10mg  q4hrs prn,. -Repeat CT shoulder performed.  Status post repeat I&D 7/6.  Another surgery next week for possible wound VAC removal and closure - CT maxillofacial shows dental lucencies.  Oral surgery, Dr 9/6 didn't think there is anything acute. Low suspicious for potential source for ID. Can follow up with Dentist outpatient.   Essential hypertension: Takes amlodipine and lisinopril at home.  Both on hold.  Blood pressure controlled.  Poor dentition with dental lucencies Oral surgery, Dr Ross Marcus didn't think there is anything acute. Low suspicious for potential source for ID.  Follow-up outpatient dentist  Anemia of chronic disease.   -Oral iron supplements with bowel regimen.  Status post 2 units PRBC 6/30.  Hemoglobin is stable over 7.  Closely monitor.   DKA.  Resolved.  Type 2 diabetes mellitus.   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   Blood sugar fairly controlled. continue reduced dose of gabapentin. Continue Lantus 20 units daily and SSI.   Ascending aorta dilatation (HCC) - TTE on 6/10 notes mild dilation of ascending aorta, 38 mm - follow TEE results as well: normal thoracic and ascending aorta noted on 02/05/21   Anxiety Continue Wellbutrin and continue to hold Zoloft.  Monitor sodium.   COVID-19 infection-resolved  Chronic hyponatremia: At his baseline.  DVT prophylaxis: Lovenox Code Status: Full Family  Communication: None present at bedside, patient fully alert and oriented  Status is: Inpatient  Remains inpatient appropriate because:Inpatient level of care appropriate due to severity of illness.  Maintain hospital stay until cleared by orthopedic and  general surgery.  Complex surgical case with multiple wounds requiring debridement  Dispo: The patient is from: Home              Anticipated d/c is to: SNF              Patient currently is not medically stable to d/c.   Difficult to place patient No     Body mass index is 29.43 kg/m.  Pressure Injury 01/31/21 Buttocks Right;Left;Posterior Stage 2 -  Partial thickness loss of dermis presenting as a shallow open injury with a red, pink wound bed without slough. (Active)  01/31/21 1546  Location: Buttocks  Location Orientation: Right;Left;Posterior  Staging: Stage 2 -  Partial thickness loss of dermis presenting as a shallow open injury with a red, pink wound bed without slough.  Wound Description (Comments):   Present on Admission: Yes      Subjective: Seen and examined.  No new complaint.  Encouraged him to use incentive spirometry.  Examination: General exam: Appears calm and comfortable  Respiratory system: Clear to auscultation. Respiratory effort normal. Cardiovascular system: S1 & S2 heard, RRR. No JVD, murmurs, rubs, gallops or clicks. No pedal edema. Gastrointestinal system: Abdomen is nondistended, soft and nontender. No organomegaly or masses felt. Normal bowel sounds heard. Central nervous system: Alert and oriented. No focal neurological deficits. Extremities: Symmetric 5 x 5 power.  Wound VAC attached to bilateral shoulders. Skin: No rashes, lesions or ulcers.  Psychiatry: Judgement and insight appear normal. Mood & affect appropriate.   Objective: Vitals:   03/03/21 2354 03/04/21 0402 03/04/21 0425 03/04/21 0811  BP:   110/70   Pulse:   79   Resp: 15 19 20 12   Temp:   98.2 F (36.8 C)   TempSrc:   Oral   SpO2: 95% 97% 98% 95%  Weight:      Height:        Intake/Output Summary (Last 24 hours) at 03/04/2021 1036 Last data filed at 03/04/2021 0954 Gross per 24 hour  Intake 940 ml  Output 2640 ml  Net -1700 ml    Filed Weights   02/08/21 1540  02/20/21 1419 02/27/21 1443  Weight: 97.3 kg 97.3 kg 93 kg     Data Reviewed:   CBC: Recent Labs  Lab 02/28/21 0753 03/01/21 0334 03/02/21 0359 03/03/21 0446 03/04/21 0305  WBC 10.0 10.7* 9.1 9.4 11.5*  HGB 9.5* 7.7* 7.6* 7.6* 7.5*  HCT 28.7* 24.7* 24.0* 24.2* 24.5*  MCV 88.3 91.8 92.0 91.0 90.7  PLT 623* 616* 588* 577* 569*    Basic Metabolic Panel: Recent Labs  Lab 02/28/21 0753 03/01/21 0334 03/02/21 0359 03/03/21 0446 03/04/21 0305  NA 131* 134* 135 136 134*  K 4.6 4.1 3.8 3.9 3.8  CL 93* 96* 97* 98 97*  CO2 26 31 31 30 29   GLUCOSE 119* 103* 101* 97 116*  BUN 10 8 11 14 14   CREATININE 0.54* 0.33* 0.49* 0.53* 0.45*  CALCIUM 8.6* 8.0* 8.0* 8.3* 8.2*  MG 1.7 1.9 1.8 1.9 1.9    GFR: Estimated Creatinine Clearance: 122.3 mL/min (A) (by C-G formula based on SCr of 0.45 mg/dL (L)). Liver Function Tests: No results for input(s): AST, ALT, ALKPHOS, BILITOT, PROT, ALBUMIN in the last 168 hours. No results for  input(s): LIPASE, AMYLASE in the last 168 hours. No results for input(s): AMMONIA in the last 168 hours. Coagulation Profile: No results for input(s): INR, PROTIME in the last 168 hours. Cardiac Enzymes: No results for input(s): CKTOTAL, CKMB, CKMBINDEX, TROPONINI in the last 168 hours. BNP (last 3 results) No results for input(s): PROBNP in the last 8760 hours. HbA1C: No results for input(s): HGBA1C in the last 72 hours. CBG: Recent Labs  Lab 03/03/21 0811 03/03/21 1138 03/03/21 1626 03/03/21 2126 03/04/21 0734  GLUCAP 104* 131* 109* 215* 118*    Lipid Profile: No results for input(s): CHOL, HDL, LDLCALC, TRIG, CHOLHDL, LDLDIRECT in the last 72 hours. Thyroid Function Tests: No results for input(s): TSH, T4TOTAL, FREET4, T3FREE, THYROIDAB in the last 72 hours. Anemia Panel: No results for input(s): VITAMINB12, FOLATE, FERRITIN, TIBC, IRON, RETICCTPCT in the last 72 hours.  Sepsis Labs: No results for input(s): PROCALCITON, LATICACIDVEN in the  last 168 hours.  Recent Results (from the past 240 hour(s))  Aerobic/Anaerobic Culture w Gram Stain (surgical/deep wound)     Status: None (Preliminary result)   Collection Time: 02/28/21  4:11 PM   Specimen: PATH Soft tissue  Result Value Ref Range Status   Specimen Description   Final    TISSUE LEFT SHOULDER A Performed at [facility], [address], [place], [place] [address]    Special Requests   Final    NONE Performed at [facility], [address] [place], [place] [address]    Gram Stain   Final    RARE WBC PRESENT, PREDOMINANTLY PMN NO ORGANISMS SEEN Performed at [facility], [address] [place], [place] [address]    Culture   Final    RARE PSEUDOMONAS AERUGINOSA SUSCEPTIBILITIES TO FOLLOW NO ANAEROBES ISOLATED; CULTURE IN PROGRESS FOR 5 DAYS    Report Status PENDING  Incomplete  Aerobic/Anaerobic Culture w Gram Stain (surgical/deep wound)     Status: None (Preliminary result)   Collection Time: 02/28/21  4:12 PM   Specimen: PATH Soft tissue  Result Value Ref Range Status   Specimen Description   Final    TISSUE  RIGHT SHOULDER Performed at [facility], [address], [place], [place] [address]    Special Requests   Final    NONE Performed at [facility], [address] [place], [place] [address]    Gram Stain NO WBC SEEN NO ORGANISMS SEEN   Final   Culture   Final    NO GROWTH 3 DAYS NO ANAEROBES ISOLATED; CULTURE IN PROGRESS FOR 5 DAYS Performed at [facility], [address], [place], [place] [address]    Report Status PENDING  Incomplete          Radiology Studies: No results found.      Scheduled Meds:  (feeding supplement) PROSource Plus  30 mL Oral TID BM   sodium chloride   Intravenous Once   acetaminophen  500 mg Oral QID   vitamin C  500 mg Oral BID   buPROPion  150 mg Oral BID   ceFAZolin (ANCEF) IVPB 2 gram/100 mL NS (Mini-Bag  Plus)  2 g Intravenous Q8H   Chlorhexidine Gluconate Cloth  6 each Topical Daily   enoxaparin (LOVENOX) injection  40 mg Subcutaneous Q24H   ferrous sulfate  325 mg Oral BID WC   gabapentin  200 mg Oral BID   HYDROmorphone   Intravenous Q4H   insulin aspart  0-15 Units Subcutaneous TID WC   insulin aspart  0-5 Units Subcutaneous QHS   insulin glargine  20 Units Subcutaneous Daily   mouth rinse  15 mL Mouth Rinse BID   nutrition supplement (JUVEN)  1 packet Oral BID BM   polyethylene glycol  17 g Oral Daily   senna-docusate  1 tablet Oral BID   sodium chloride flush  5 mL Intracatheter Q8H   zinc sulfate  220 mg Oral Daily   Continuous Infusions:  sodium chloride 250 mL (03/02/21 0108)     LOS: 32 days   Time spent= 30 mins  Hughie Closs, MD [facility]  If 7PM-7AM, please contact night-coverage  03/04/2021, 10:36 AM

## 2021-03-04 NOTE — Progress Notes (Signed)
Pharmacy Antibiotic Note  MUHANNAD BIGNELL is a 55 y.o. male admitted on 01/31/2021.  He has been on cefazolin for disseminated MSSA infection/bacteremia.  Pharmacy has been consulted for Cefepime dosing for new pseudomonas aeruginosa result from shoulder culture.     Plan: D/C Cefazolin Cefepime 2g IV q8h Follow up renal function, culture results, and clinical course.   Height: 5\' 10"  (177.8 cm) Weight: 93 kg (205 lb 1.6 oz) IBW/kg (Calculated) : 73  Temp (24hrs), Avg:98.7 F (37.1 C), Min:98.2 F (36.8 C), Max:99 F (37.2 C)  Recent Labs  Lab 02/28/21 0753 03/01/21 0334 03/02/21 0359 03/03/21 0446 03/04/21 0305  WBC 10.0 10.7* 9.1 9.4 11.5*  CREATININE 0.54* 0.33* 0.49* 0.53* 0.45*    Estimated Creatinine Clearance: 122.3 mL/min (A) (by C-G formula based on SCr of 0.45 mg/dL (L)).    Allergies  Allergen Reactions   Tizanidine     Leg cramps    Antimicrobials this admission: 6/8 Vanc/ Cefepime/Flagyl x 1 6/8 CTX >>6/9 6/9 Ancef >> 7/10 7/10 Cefepime >>   Thank you for allowing pharmacy to be a part of this patient's care. 8/9 PharmD, BCPS Clinical Pharmacist [facility] main pharmacy [phone] 03/04/2021 6:53 PM

## 2021-03-04 NOTE — Progress Notes (Signed)
Subjective:  Patient brushing teeth and washing face. States left wound vac had a blockage and was unable to work. A bulky dressing was placed over. Having some drainage from the bulky dressing.   Objective:  PE: VITALS:   Vitals:   03/03/21 2131 03/03/21 2354 03/04/21 0402 03/04/21 0425  BP: 113/72   110/70  Pulse: 87   79  Resp: 20 15 19 20   Temp: 98.8 F (37.1 C)   98.2 F (36.8 C)  TempSrc: Oral   Oral  SpO2: 100% 95% 97% 98%  Weight:      Height:        MSK:  RUE: R incisional vac in place with good seal, no drainage in R shoulder canister. Hemovac in place about 10 cc seroanginous drainage noted in the canister LUE: Hemovac in place about 30 cc of purulent fluid noted in the canister. L wound vac was disconnected upon my arrival due to a blockage. There was purulent drainage coming from the distal aspect of the incision. Wound vac was hooked up again and reinforced with tegaderm. It began to seal and flow. There again was a blockage noted in the tubing.   LABS  Results for orders placed or performed during the hospital encounter of 01/31/21 (from the past 24 hour(s))  Glucose, capillary     Status: Abnormal   Collection Time: 03/03/21  8:11 AM  Result Value Ref Range   Glucose-Capillary 104 (H) 70 - 99 mg/dL  Glucose, capillary     Status: Abnormal   Collection Time: 03/03/21 11:38 AM  Result Value Ref Range   Glucose-Capillary 131 (H) 70 - 99 mg/dL  Glucose, capillary     Status: Abnormal   Collection Time: 03/03/21  4:26 PM  Result Value Ref Range   Glucose-Capillary 109 (H) 70 - 99 mg/dL  Glucose, capillary     Status: Abnormal   Collection Time: 03/03/21  9:26 PM  Result Value Ref Range   Glucose-Capillary 215 (H) 70 - 99 mg/dL  Basic metabolic panel     Status: Abnormal   Collection Time: 03/04/21  3:05 AM  Result Value Ref Range   Sodium 134 (L) 135 - 145 mmol/L   Potassium 3.8 3.5 - 5.1 mmol/L   Chloride 97 (L) 98 - 111 mmol/L   CO2 29 22 - 32  mmol/L   Glucose, Bld 116 (H) 70 - 99 mg/dL   BUN 14 6 - 20 mg/dL   Creatinine, Ser 05/05/21 (L) 0.61 - 1.24 mg/dL   Calcium 8.2 (L) 8.9 - 10.3 mg/dL   GFR, Estimated 5.70 >17 mL/min   Anion gap 8 5 - 15  CBC     Status: Abnormal   Collection Time: 03/04/21  3:05 AM  Result Value Ref Range   WBC 11.5 (H) 4.0 - 10.5 K/uL   RBC 2.70 (L) 4.22 - 5.81 MIL/uL   Hemoglobin 7.5 (L) 13.0 - 17.0 g/dL   HCT 05/05/21 (L) 39.0 - 30.0 %   MCV 90.7 80.0 - 100.0 fL   MCH 27.8 26.0 - 34.0 pg   MCHC 30.6 30.0 - 36.0 g/dL   RDW 92.3 30.0 - 76.2 %   Platelets 569 (H) 150 - 400 K/uL   nRBC 0.0 0.0 - 0.2 %  Magnesium     Status: None   Collection Time: 03/04/21  3:05 AM  Result Value Ref Range   Magnesium 1.9 1.7 - 2.4 mg/dL  Glucose, capillary  Status: Abnormal   Collection Time: 03/04/21  7:34 AM  Result Value Ref Range   Glucose-Capillary 118 (H) 70 - 99 mg/dL    No results found.  Assessment/Plan: Bilateral shoulder septic arthritis - L shoulder irrigation and debridement 6/16 - R shoulder irrigation and debridement with distal clavicle resection 6/17 - bilateral shoulder irrigation and debridement with wound vac placement bilaterally 6/29 - bilateral shoulder irrigation and debridement with closure and incisional wound vac placement, hemovac placement R shoulder, L wound vac and hemovac placement 7/7 - IV antibiotics per ID - continue wound vacs and hemovacs, will plan to d/c hemovacs when drainage is <15 cc's for 2 days. Will have nursing staff order new tubing and canister for the wound vac on the left shoulder. If this continues to have issues then we may need to reinforced with a large bulky dressing. Patient states he will not tolerate an attempt to replace the wound foam.   Plan to repeat I&D of left shoulder early next week, Monday or Tuesday with Dr. Dion Saucier depending on OR availability   Vernetta Honey 03/04/2021, 7:45 AM

## 2021-03-05 LAB — CBC WITH DIFFERENTIAL/PLATELET
Abs Immature Granulocytes: 0.05 10*3/uL (ref 0.00–0.07)
Basophils Absolute: 0 10*3/uL (ref 0.0–0.1)
Basophils Relative: 0 %
Eosinophils Absolute: 0.2 10*3/uL (ref 0.0–0.5)
Eosinophils Relative: 2 %
HCT: 23.8 % — ABNORMAL LOW (ref 39.0–52.0)
Hemoglobin: 7.5 g/dL — ABNORMAL LOW (ref 13.0–17.0)
Immature Granulocytes: 1 %
Lymphocytes Relative: 16 %
Lymphs Abs: 1.7 10*3/uL (ref 0.7–4.0)
MCH: 28.4 pg (ref 26.0–34.0)
MCHC: 31.5 g/dL (ref 30.0–36.0)
MCV: 90.2 fL (ref 80.0–100.0)
Monocytes Absolute: 1.1 10*3/uL — ABNORMAL HIGH (ref 0.1–1.0)
Monocytes Relative: 10 %
Neutro Abs: 7.4 10*3/uL (ref 1.7–7.7)
Neutrophils Relative %: 71 %
Platelets: 536 10*3/uL — ABNORMAL HIGH (ref 150–400)
RBC: 2.64 MIL/uL — ABNORMAL LOW (ref 4.22–5.81)
RDW: 15.5 % (ref 11.5–15.5)
WBC: 10.4 10*3/uL (ref 4.0–10.5)
nRBC: 0 % (ref 0.0–0.2)

## 2021-03-05 LAB — BASIC METABOLIC PANEL
Anion gap: 9 (ref 5–15)
BUN: 13 mg/dL (ref 6–20)
CO2: 29 mmol/L (ref 22–32)
Calcium: 8.4 mg/dL — ABNORMAL LOW (ref 8.9–10.3)
Chloride: 99 mmol/L (ref 98–111)
Creatinine, Ser: 0.38 mg/dL — ABNORMAL LOW (ref 0.61–1.24)
GFR, Estimated: >60 mL/min (ref >60)
Glucose, Bld: 136 mg/dL — ABNORMAL HIGH (ref 70–99)
Potassium: 3.8 mmol/L (ref 3.5–5.1)
Sodium: 137 mmol/L (ref 135–145)

## 2021-03-05 LAB — AEROBIC/ANAEROBIC CULTURE W GRAM STAIN (SURGICAL/DEEP WOUND)
Culture: NO GROWTH
Gram Stain: NONE SEEN

## 2021-03-05 LAB — GLUCOSE, CAPILLARY
Glucose-Capillary: 105 mg/dL — ABNORMAL HIGH (ref 70–99)
Glucose-Capillary: 169 mg/dL — ABNORMAL HIGH (ref 70–99)
Glucose-Capillary: 86 mg/dL (ref 70–99)
Glucose-Capillary: 173 mg/dL — ABNORMAL HIGH (ref 70–99)

## 2021-03-05 MED ORDER — CHLORHEXIDINE GLUCONATE 4 % EX LIQD
60.0000 mL | Freq: Once | CUTANEOUS | Status: AC
  Administered 2021-03-06: 4 via TOPICAL
  Filled 2021-03-05: qty 60

## 2021-03-05 MED ORDER — ACETAMINOPHEN 500 MG PO TABS
1000.0000 mg | ORAL_TABLET | Freq: Once | ORAL | Status: AC
  Administered 2021-03-06: 1000 mg via ORAL
  Filled 2021-03-05: qty 2

## 2021-03-05 MED ORDER — POVIDONE-IODINE 10 % EX SWAB: 2.0000 "application " | Freq: Once | CUTANEOUS | Status: DC

## 2021-03-05 MED ORDER — ENSURE ENLIVE PO LIQD
237.0000 mL | ORAL | Status: DC
  Administered 2021-03-05 – 2021-03-08 (×3): 237 mL via ORAL

## 2021-03-05 NOTE — Progress Notes (Signed)
[facility]    Date of Admission:  01/31/2021     ID: John Ware is a 55 y.o. male with  disseminated MSSA infection including TV endocarditis, bilateral septic shoulder joints s/p wash out, pulmonary septic emboli, psoas m asbcess and chest wall abscess. Most recent or cx + PsA Active Problems:   Right knee pain   Diabetes mellitus (HCC)   Hyponatremia   Pressure injury of skin   HTN (hypertension)   Anxiety   Left shoulder pain   Ascending aorta dilatation (HCC)   Vision changes   Endocarditis   Right shoulder pain   Chest wall abscess   Obesity   Normocytic anemia   Thrombocytosis   Psoas muscle abscess (HCC)    Subjective: Afebrile, started on cefepime yesterday due to cx results. To return to OR for washout tomorrow  Medications:   (feeding supplement) PROSource Plus  30 mL Oral TID BM   sodium chloride   Intravenous Once   acetaminophen  500 mg Oral QID   vitamin C  500 mg Oral BID   buPROPion  150 mg Oral BID   Chlorhexidine Gluconate Cloth  6 each Topical Daily   enoxaparin (LOVENOX) injection  40 mg Subcutaneous Q24H   feeding supplement  237 mL Oral Q24H   ferrous sulfate  325 mg Oral BID WC   gabapentin  200 mg Oral BID   HYDROmorphone   Intravenous Q4H   insulin aspart  0-15 Units Subcutaneous TID WC   insulin aspart  0-5 Units Subcutaneous QHS   insulin glargine  20 Units Subcutaneous Daily   mouth rinse  15 mL Mouth Rinse BID   nutrition supplement (JUVEN)  1 packet Oral BID BM   polyethylene glycol  17 g Oral Daily   senna-docusate  1 tablet Oral BID   sodium chloride flush  5 mL Intracatheter Q8H   zinc sulfate  220 mg Oral Daily    Objective: Vital signs in last 24 hours: Temp:  [97.5 F (36.4 C)-99.3 F (37.4 C)] 98.3 F (36.8 C) (07/11 1241) Pulse Rate:  [79-86] 85 (07/11 1241) Resp:  [13-18] 15 (07/11 1727) BP: (115-119)/(75-83) 115/83 (07/11 1241) SpO2:  [97 %-100 %] 98 % (07/11 1727) FiO2 (%):  [0 %]  0 % (07/11 0350) Physical Exam  Constitutional: He is oriented to person, place, and time. He appears well-developed and well-nourished. No distress.  HENT:  Mouth/Throat: Oropharynx is clear and moist. No oropharyngeal exudate.  Cardiovascular: Normal rate, regular rhythm and normal heart sounds. Exam reveals no gallop and no friction rub.  No murmur heard.  Pulmonary/Chest: Effort normal and breath sounds normal. No respiratory distress. He has no wheezes.  Abdominal: Soft. Bowel sounds are normal. He exhibits no distension. There is no tenderness.  XLK:GMWNU vac on left shoulder and temp wound vac on righ shoulder. 2 drains still in place Neurological: He is alert and oriented to person, place, and time.  Skin: Skin is warm and dry. No rash noted. No erythema.  Psychiatric: He has a normal mood and affect. His behavior is normal.    Lab Results Recent Labs    03/04/21 0305 03/05/21 0340  WBC 11.5* 10.4  HGB 7.5* 7.5*  HCT 24.5* 23.8*  NA 134* 137  K 3.8 3.8  CL 97* 99  CO2 29 29  BUN 14 13  CREATININE 0.45* 0.38*   Lab Results  Component Value Date   ESRSEDRATE 41 (H) 02/07/2021  Microbiology: reviewed Studies/Results: No results found.   Assessment/Plan: Pseudomonal and MSSA septic arthritis of shoulder = continue on cefepime. We will determine abtx end date after he finishes his last wash out of shoulder and aspiration of any further abscess.  MSSA disseminated infection =would get some coverage with MSSA  IDDM =continue under good control with BS<200 to help with wound healing.  [facility] Cell: [phone] Pager: [phone]  03/05/2021, 5:54 PM

## 2021-03-05 NOTE — Progress Notes (Addendum)
Nutrition Follow-up  DOCUMENTATION CODES:   Not applicable  INTERVENTION:  - continue 30 ml Prosource Plus TID and 1 packet Juven BID. - will order Ensure Enlive once/day, each supplement provides 350 kcal and 20 grams of protein.   NUTRITION DIAGNOSIS:   Increased nutrient needs related to acute illness, wound healing as evidenced by estimated needs. -ongoing  GOAL:   Patient will meet greater than or equal to 90% of their needs -unmet on average   MONITOR:   PO intake, Supplement acceptance, Labs, Weight trends, Skin, I & O's  ASSESSMENT:   55 year old male with medical history of DM, HTN, depression, and chronic back pain on chronic narcotics 2/2 spinal stenosis. He presented to the ED d/t generalized malaise x3 weeks. He tested positive for COVID at home on 01/12/21 and presented to the ED on 5/28 after falling out of a chair and having subsequent shoulder pain. Imaging was negative so he was given a sling for comfort. He returned to the ED on 6/8 d/t inability to walk. 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 CT chest showed large L lateral chest injury  Recently documented meal intakes: 7/8- 100% of breakfast and dinner (total of 1101 kcal and 48 grams protein 7/9- 100% of breakfast, 0% of lunch, and 100% of dinner (total of 1243 kcal and 64 grams protein) 7/10- 75% of breakfast and 100% of lunch (total of 760 kcal and 58 grams protein) 7/11- 100% of breakfast (544 kcal and 37 grams protein)  He has been accepting Prosource and Juven 100% of the time offered. If he consumes 100% each time he accepts them, he is consuming 490 kcal and 50 grams protein/day from oral nutrition supplements.  He has not been weighed since 7/5. No information documented in the edema section of flow sheet since 7/8.   Per notes: - pending repeat L shoulder I&D on 7/12 - poor dentition with Dr. Ross Marcus indicating there is nothing acute--plan for  outpatient dentistry  - DKA--resolved - anemia of chronic disease - from home with plan for SNF at time of d/c     Labs reviewed; CBGs: 105 and 169 mg/dl, creatinine: 5.80 mg/dl, Ca: 8.4 mg/dl.  Medications reviewed; 500 mg ascorbic acid BID, 325 mg ferrous sulfate BID, sliding scale novolog, 20 units lantus/day, 17 g miralax/day, 1 tablet senokot BID, 220 mg zinc sulfate/day.    Diet Order:   Diet Order             Diet NPO time specified  Diet effective ____           Diet Carb Modified Fluid consistency: Thin; Room service appropriate? Yes  Diet effective now                   EDUCATION NEEDS:   Education needs have been addressed  Skin:  Skin Assessment: Skin Integrity Issues: Skin Integrity Issues:: Incisions, Wound VAC, Stage II Stage II: bilateral buttocks Wound Vac: bilateral shoulders Incisions: L chest (6/18, 6/28); R shoulder (6/16, 6/18, 6/28); L shoulder (6/28); back (6/28)  Last BM:  7/8  Height:   Ht Readings from Last 1 Encounters:  02/20/21 5\' 10"  (1.778 m)    Weight:   Wt Readings from Last 1 Encounters:  02/27/21 93 kg      Estimated Nutritional Needs:  Kcal:  2500-2700 kcal Protein:  125-140 grams Fluid:  >/= 2.5 L/day     04/30/21, MS, RD, LDN, CNSC  Inpatient Clinical Dietitian RD pager # available in [place]  After hours/weekend pager # available in [facility]

## 2021-03-05 NOTE — Progress Notes (Signed)
Patient's central line dressing appears as it is lifted and doesn't have a good occlusive seal. Orders placed for IV team to come assess dressing per protocol.

## 2021-03-05 NOTE — Progress Notes (Signed)
PROGRESS NOTE    John Ware  RJJ:[id number] DOB: [birth year] DOA: 01/31/2021 PCP: Junie Spencer, FNP   Brief Narrative:  55 year old with history of chronic pain, HTN, DM2, COVID-positive at home 5/20 reporting of body pain, shoulder pain eventually came to the ER.  Found to have MSSA bacteremia started on IV Ancef.  Large fluid collection on the left lateral posterior chest and back was noted.  Underwent MRI T-spine and CT CAP showing large multiloculated effusion  and osteomyelitis of thoracic spine.  There was concerns for septic emboli but echo was negative for TEE performed on 6/13 showed large tricuspid vegetation.  General surgery and orthopedic consulted.  Underwent left shoulder debridement 02/08/2021.  Postop had increasing size of his abscess collection therefore general surgery performed repeat debridement with wound VAC placement.  Dressing management per surgical team.  CT maxillofacial showed oral lucencies therefore oral surgery consulted.  Oral surgery did not think this was acute and Low suspicion for source per ID.  Underwent repeat I&D by Ortho on 02/28/2021.  Culture showing Pseudomonas.  Antibiotics switched to cefepime on 03/04/2020.   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  Large left posterior wall and bilateral psoas muscle abscess, bilateral septic shoulder joint, osteomyelitis and discitis.  Also possible floaters but endopthalmitis was ruled out. - Left shoulder  debridement 6/16, right shoulder debridement 6/17, drain placement by IR 6/24.  Status post bilateral shoulder debridement with wound VAC placement 6/29.  Pathology for necrotic lymph node -benign adipose tissue - IV Ancef.  ID following.  No end date planned at this time. - PCA for pain control. Oxycodone 10mg  q4hrs prn,. -Repeat CT shoulder performed.  Status post repeat I&D 7/6, cultures growing Pseudomonas.  Antibiotics transitioned to cefepime on 03/07/2021.  Plan for another drainage of left shoulder tomorrow morning per surgery.  Essential hypertension: Takes amlodipine and lisinopril at home.  Both on hold.  Blood pressure controlled.  Poor dentition with dental lucencies per CT scan. Oral surgery, Dr 03/09/2021 didn't think there is anything acute. Low suspicious for potential source for ID.  Follow-up outpatient dentist  Anemia of chronic disease.   -Oral iron supplements with bowel regimen.  Status post 2 units PRBC 6/30.  Hemoglobin is stable over 7.  Closely monitor.   DKA.  Resolved.  Type 2 diabetes mellitus.   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   Blood sugar fairly controlled. continue reduced dose of gabapentin. Continue Lantus 20 units daily and SSI.   Ascending aorta dilatation (HCC) - TTE on 6/10 notes mild dilation of ascending aorta, 38 mm - TEE shows normal thoracic and ascending aorta noted on 02/05/21   Anxiety Continue Wellbutrin and continue to hold Zoloft.  Monitor sodium.   COVID-19 infection-resolved  Chronic hyponatremia: At his baseline.  DVT prophylaxis: Lovenox Code Status: Full Family Communication: None present at bedside, patient fully alert and oriented  Status is: Inpatient  Remains inpatient appropriate because:Inpatient level of care appropriate due to severity of illness.  Maintain hospital stay until cleared by orthopedic and general surgery.  Complex surgical case with multiple  wounds requiring debridement  Dispo: The patient is from: Home              Anticipated d/c is to: SNF              Patient currently is not medically stable to d/c.   Difficult to place patient No     Body mass index is 29.43 kg/m.  Pressure Injury 01/31/21 Buttocks Right;Left;Posterior Stage 2 -  Partial thickness loss of dermis presenting as a shallow open injury with a red, pink wound bed without slough. (Active)  01/31/21 1546  Location: Buttocks  Location Orientation: Right;Left;Posterior  Staging: Stage 2 -  Partial thickness loss of dermis presenting as a shallow open injury with a red, pink wound bed without slough.  Wound Description (Comments):   Present on Admission: Yes      Subjective: Seen and examined.  He is comfortable.  Pain controlled.  Examination: General exam: Appears calm and comfortable  Respiratory system: Clear to auscultation. Respiratory effort normal. Cardiovascular system: S1 & S2 heard, RRR. No JVD, murmurs, rubs, gallops or clicks. No pedal edema. Gastrointestinal system: Abdomen is nondistended, soft and nontender. No organomegaly or masses felt. Normal bowel sounds heard. Central nervous system: Alert and oriented. No focal neurological deficits. Extremities: Wound vacs attached to bilateral shoulders Skin: No rashes, lesions or ulcers.  Psychiatry: Judgement and insight appear normal. Mood & affect appropriate.    Objective: Vitals:   03/05/21 0422 03/05/21 0740 03/05/21 1241 03/05/21 1255  BP: 119/75  115/83   Pulse: 79  85   Resp: 18 13 18 13   Temp: (!) 97.5 F (36.4 C)  98.3 F (36.8 C)   TempSrc: Oral  Oral   SpO2: 100% 99% 100% 100%  Weight:      Height:        Intake/Output Summary (Last 24 hours) at 03/05/2021 1345 Last data filed at 03/05/2021 1200 Gross per 24 hour  Intake 1390.56 ml  Output 3620 ml  Net -2229.44 ml    Filed Weights   02/08/21 1540 02/20/21 1419 02/27/21 1443  Weight: 97.3 kg 97.3 kg 93 kg      Data Reviewed:   CBC: Recent Labs  Lab 03/01/21 0334 03/02/21 0359 03/03/21 0446 03/04/21 0305 03/05/21 0340  WBC 10.7* 9.1 9.4 11.5* 10.4  NEUTROABS  --   --   --   --  7.4  HGB 7.7* 7.6* 7.6* 7.5* 7.5*  HCT 24.7* 24.0* 24.2* 24.5* 23.8*  MCV 91.8 92.0 91.0 90.7 90.2  PLT 616* 588* 577* 569* 536*    Basic Metabolic Panel: Recent Labs  Lab 02/28/21 0753 03/01/21 0334 03/02/21 0359 03/03/21 0446 03/04/21 0305 03/05/21 0340  NA 131* 134* 135 136 134* 137  K 4.6 4.1 3.8 3.9 3.8 3.8  CL 93* 96* 97* 98 97* 99  CO2 26 31 31 30 29 29   GLUCOSE 119* 103* 101* 97 116* 136*  BUN 10 8 11 14 14 13   CREATININE 0.54* 0.33* 0.49* 0.53* 0.45* 0.38*  CALCIUM 8.6* 8.0* 8.0* 8.3* 8.2* 8.4*  MG 1.7 1.9 1.8 1.9 1.9  --     GFR: Estimated Creatinine Clearance: 122.3 mL/min (A) (by C-G formula based on SCr of 0.38 mg/dL (L)). Liver Function Tests: No results for input(s): AST, ALT, ALKPHOS, BILITOT, PROT, ALBUMIN  in the last 168 hours. No results for input(s): LIPASE, AMYLASE in the last 168 hours. No results for input(s): AMMONIA in the last 168 hours. Coagulation Profile: No results for input(s): INR, PROTIME in the last 168 hours. Cardiac Enzymes: No results for input(s): CKTOTAL, CKMB, CKMBINDEX, TROPONINI in the last 168 hours. BNP (last 3 results) No results for input(s): PROBNP in the last 8760 hours. HbA1C: No results for input(s): HGBA1C in the last 72 hours. CBG: Recent Labs  Lab 03/04/21 1150 03/04/21 1644 03/04/21 2123 03/05/21 0741 03/05/21 1214  GLUCAP 123* 91 160* 105* 169*    Lipid Profile: No results for input(s): CHOL, HDL, LDLCALC, TRIG, CHOLHDL, LDLDIRECT in the last 72 hours. Thyroid Function Tests: No results for input(s): TSH, T4TOTAL, FREET4, T3FREE, THYROIDAB in the last 72 hours. Anemia Panel: No results for input(s): VITAMINB12, FOLATE, FERRITIN, TIBC, IRON, RETICCTPCT in the last 72 hours.  Sepsis Labs: No results for input(s):  PROCALCITON, LATICACIDVEN in the last 168 hours.  Recent Results (from the past 240 hour(s))  Aerobic/Anaerobic Culture w Gram Stain (surgical/deep wound)     Status: None (Preliminary result)   Collection Time: 02/28/21  4:11 PM   Specimen: PATH Soft tissue  Result Value Ref Range Status   Specimen Description   Final    TISSUE LEFT SHOULDER A Performed at [facility], [address], [place], [place] [address]    Special Requests   Final    NONE Performed at [facility], [address] [place], [place] [address]    Gram Stain   Final    RARE WBC PRESENT, PREDOMINANTLY PMN NO ORGANISMS SEEN Performed at [facility], [address] [place], [place] [address]    Culture   Final    RARE PSEUDOMONAS AERUGINOSA NO ANAEROBES ISOLATED; CULTURE IN PROGRESS FOR 5 DAYS    Report Status PENDING  Incomplete   Organism ID, Bacteria PSEUDOMONAS AERUGINOSA  Final      Susceptibility   Pseudomonas aeruginosa - MIC*    CEFTAZIDIME 4 SENSITIVE Sensitive     CIPROFLOXACIN <=0.25 SENSITIVE Sensitive     GENTAMICIN <=1 SENSITIVE Sensitive     IMIPENEM 1 SENSITIVE Sensitive     PIP/TAZO 8 SENSITIVE Sensitive     CEFEPIME 2 SENSITIVE Sensitive     * RARE PSEUDOMONAS AERUGINOSA  Aerobic/Anaerobic Culture w Gram Stain (surgical/deep wound)     Status: None (Preliminary result)   Collection Time: 02/28/21  4:12 PM   Specimen: PATH Soft tissue  Result Value Ref Range Status   Specimen Description   Final    TISSUE  RIGHT SHOULDER Performed at [facility], [address], [place], [place] [address]    Special Requests   Final    NONE Performed at [facility], [address] [place], [place] [address]    Gram Stain NO WBC SEEN NO ORGANISMS SEEN   Final   Culture   Final    NO GROWTH 4 DAYS NO ANAEROBES ISOLATED; CULTURE IN PROGRESS FOR 5 DAYS Performed at [facility], [address],  [place], [place] [address]    Report Status PENDING  Incomplete          Radiology Studies: No results found.      Scheduled Meds:  (feeding supplement) PROSource Plus  30 mL Oral TID BM   sodium chloride   Intravenous Once   acetaminophen  500 mg Oral QID  vitamin C  500 mg Oral BID   buPROPion  150 mg Oral BID   Chlorhexidine Gluconate Cloth  6 each Topical Daily   enoxaparin (LOVENOX) injection  40 mg Subcutaneous Q24H   ferrous sulfate  325 mg Oral BID WC   gabapentin  200 mg Oral BID   HYDROmorphone   Intravenous Q4H   insulin aspart  0-15 Units Subcutaneous TID WC   insulin aspart  0-5 Units Subcutaneous QHS   insulin glargine  20 Units Subcutaneous Daily   mouth rinse  15 mL Mouth Rinse BID   nutrition supplement (JUVEN)  1 packet Oral BID BM   polyethylene glycol  17 g Oral Daily   senna-docusate  1 tablet Oral BID   sodium chloride flush  5 mL Intracatheter Q8H   zinc sulfate  220 mg Oral Daily   Continuous Infusions:  sodium chloride 250 mL (03/02/21 0108)   ceFEPime (MAXIPIME) IV 2 g (03/05/21 1337)     LOS: 33 days   Time spent= 28 mins  Hughie Clossavi Becci Batty, MD [facility]  If 7PM-7AM, please contact night-coverage  03/05/2021, 1:45 PM

## 2021-03-05 NOTE — Progress Notes (Signed)
Subjective:  Ongoing bilateral shoulder pain, worse than prior to most recent surgery, he states shoulder and back pain are 50/50 in terms of what is bothering him most. No other complaints.  Objective:  PE: VITALS:   Vitals:   03/04/21 2356 03/05/21 0350 03/05/21 0422 03/05/21 0740  BP:   119/75   Pulse:   79   Resp: 15 15 18 13   Temp:   (!) 97.5 F (36.4 C)   TempSrc:   Oral   SpO2: 100% 100% 100% 99%  Weight:      Height:        MSK: No active forward flexion of right shoulder or left shoulder due to pain., Full ROM of bilateral elbows, wrist, and all fingers of hands. R incisional vac in place with good seal, no drainage in R shoulder canister. L wound vac in place with good seal, 10 cc's in canister. Distal sensation and capillary refill intact. 5/5 grip strength bilaterally.   LABS  Results for orders placed or performed during the hospital encounter of 01/31/21 (from the past 24 hour(s))  Glucose, capillary     Status: Abnormal   Collection Time: 03/04/21 11:50 AM  Result Value Ref Range   Glucose-Capillary 123 (H) 70 - 99 mg/dL   Comment 1 Notify RN   Glucose, capillary     Status: None   Collection Time: 03/04/21  4:44 PM  Result Value Ref Range   Glucose-Capillary 91 70 - 99 mg/dL   Comment 1 Notify RN   Glucose, capillary     Status: Abnormal   Collection Time: 03/04/21  9:23 PM  Result Value Ref Range   Glucose-Capillary 160 (H) 70 - 99 mg/dL  CBC with Differential/Platelet     Status: Abnormal   Collection Time: 03/05/21  3:40 AM  Result Value Ref Range   WBC 10.4 4.0 - 10.5 K/uL   RBC 2.64 (L) 4.22 - 5.81 MIL/uL   Hemoglobin 7.5 (L) 13.0 - 17.0 g/dL   HCT 05/06/21 (L) 78.2 - 95.6 %   MCV 90.2 80.0 - 100.0 fL   MCH 28.4 26.0 - 34.0 pg   MCHC 31.5 30.0 - 36.0 g/dL   RDW 21.3 08.6 - 57.8 %   Platelets 536 (H) 150 - 400 K/uL   nRBC 0.0 0.0 - 0.2 %   Neutrophils Relative % 71 %   Neutro Abs 7.4 1.7 - 7.7 K/uL   Lymphocytes Relative 16 %   Lymphs Abs  1.7 0.7 - 4.0 K/uL   Monocytes Relative 10 %   Monocytes Absolute 1.1 (H) 0.1 - 1.0 K/uL   Eosinophils Relative 2 %   Eosinophils Absolute 0.2 0.0 - 0.5 K/uL   Basophils Relative 0 %   Basophils Absolute 0.0 0.0 - 0.1 K/uL   Immature Granulocytes 1 %   Abs Immature Granulocytes 0.05 0.00 - 0.07 K/uL  Basic metabolic panel     Status: Abnormal   Collection Time: 03/05/21  3:40 AM  Result Value Ref Range   Sodium 137 135 - 145 mmol/L   Potassium 3.8 3.5 - 5.1 mmol/L   Chloride 99 98 - 111 mmol/L   CO2 29 22 - 32 mmol/L   Glucose, Bld 136 (H) 70 - 99 mg/dL   BUN 13 6 - 20 mg/dL   Creatinine, Ser 05/06/21 (L) 0.61 - 1.24 mg/dL   Calcium 8.4 (L) 8.9 - 10.3 mg/dL   GFR, Estimated 6.29 >52 mL/min   Anion gap 9  5 - 15  Glucose, capillary     Status: Abnormal   Collection Time: 03/05/21  7:41 AM  Result Value Ref Range   Glucose-Capillary 105 (H) 70 - 99 mg/dL    No results found.  Assessment/Plan: Bilateral shoulder septic arthritis - L shoulder irrigation and debridement 6/16 - R shoulder irrigation and debridement with distal clavicle resection 6/17 - bilateral shoulder irrigation and debridement with wound vac placement bilaterally 6/29 - bilateral shoulder irrigation and debridement with closure and incisional wound vac placement, hemovac placement R shoulder, L wound vac and hemovac placement 7/7 - IV antibiotics per ID, new pseudomonas seen on most recent shoulder culture - continue wound vacs and hemovacs, will plan to d/c tomorrow, plan to go to OR tomorrow for repeat ID. With state of left shoulder, patient may need to L shoulder wash outs this week.   Armida Sans 03/05/2021, 8:44 AM

## 2021-03-05 NOTE — H&P (View-Only) (Signed)
Subjective:  Ongoing bilateral shoulder pain, worse than prior to most recent surgery, he states shoulder and back pain are 50/50 in terms of what is bothering him most. No other complaints.  Objective:  PE: VITALS:   Vitals:   03/04/21 2356 03/05/21 0350 03/05/21 0422 03/05/21 0740  BP:   119/75   Pulse:   79   Resp: 15 15 18 13   Temp:   (!) 97.5 F (36.4 C)   TempSrc:   Oral   SpO2: 100% 100% 100% 99%  Weight:      Height:        MSK: No active forward flexion of right shoulder or left shoulder due to pain., Full ROM of bilateral elbows, wrist, and all fingers of hands. R incisional vac in place with good seal, no drainage in R shoulder canister. L wound vac in place with good seal, 10 cc's in canister. Distal sensation and capillary refill intact. 5/5 grip strength bilaterally.   LABS  Results for orders placed or performed during the hospital encounter of 01/31/21 (from the past 24 hour(s))  Glucose, capillary     Status: Abnormal   Collection Time: 03/04/21 11:50 AM  Result Value Ref Range   Glucose-Capillary 123 (H) 70 - 99 mg/dL   Comment 1 Notify RN   Glucose, capillary     Status: None   Collection Time: 03/04/21  4:44 PM  Result Value Ref Range   Glucose-Capillary 91 70 - 99 mg/dL   Comment 1 Notify RN   Glucose, capillary     Status: Abnormal   Collection Time: 03/04/21  9:23 PM  Result Value Ref Range   Glucose-Capillary 160 (H) 70 - 99 mg/dL  CBC with Differential/Platelet     Status: Abnormal   Collection Time: 03/05/21  3:40 AM  Result Value Ref Range   WBC 10.4 4.0 - 10.5 K/uL   RBC 2.64 (L) 4.22 - 5.81 MIL/uL   Hemoglobin 7.5 (L) 13.0 - 17.0 g/dL   HCT 05/06/21 (L) 78.2 - 95.6 %   MCV 90.2 80.0 - 100.0 fL   MCH 28.4 26.0 - 34.0 pg   MCHC 31.5 30.0 - 36.0 g/dL   RDW 21.3 08.6 - 57.8 %   Platelets 536 (H) 150 - 400 K/uL   nRBC 0.0 0.0 - 0.2 %   Neutrophils Relative % 71 %   Neutro Abs 7.4 1.7 - 7.7 K/uL   Lymphocytes Relative 16 %   Lymphs Abs  1.7 0.7 - 4.0 K/uL   Monocytes Relative 10 %   Monocytes Absolute 1.1 (H) 0.1 - 1.0 K/uL   Eosinophils Relative 2 %   Eosinophils Absolute 0.2 0.0 - 0.5 K/uL   Basophils Relative 0 %   Basophils Absolute 0.0 0.0 - 0.1 K/uL   Immature Granulocytes 1 %   Abs Immature Granulocytes 0.05 0.00 - 0.07 K/uL  Basic metabolic panel     Status: Abnormal   Collection Time: 03/05/21  3:40 AM  Result Value Ref Range   Sodium 137 135 - 145 mmol/L   Potassium 3.8 3.5 - 5.1 mmol/L   Chloride 99 98 - 111 mmol/L   CO2 29 22 - 32 mmol/L   Glucose, Bld 136 (H) 70 - 99 mg/dL   BUN 13 6 - 20 mg/dL   Creatinine, Ser 05/06/21 (L) 0.61 - 1.24 mg/dL   Calcium 8.4 (L) 8.9 - 10.3 mg/dL   GFR, Estimated 6.29 >52 mL/min   Anion gap 9  5 - 15  Glucose, capillary     Status: Abnormal   Collection Time: 03/05/21  7:41 AM  Result Value Ref Range   Glucose-Capillary 105 (H) 70 - 99 mg/dL    No results found.  Assessment/Plan: Bilateral shoulder septic arthritis - L shoulder irrigation and debridement 6/16 - R shoulder irrigation and debridement with distal clavicle resection 6/17 - bilateral shoulder irrigation and debridement with wound vac placement bilaterally 6/29 - bilateral shoulder irrigation and debridement with closure and incisional wound vac placement, hemovac placement R shoulder, L wound vac and hemovac placement 7/7 - IV antibiotics per ID, new pseudomonas seen on most recent shoulder culture - continue wound vacs and hemovacs, will plan to d/c tomorrow, plan to go to OR tomorrow for repeat ID. With state of left shoulder, patient may need to L shoulder wash outs this week.   John Ware 03/05/2021, 8:44 AM  

## 2021-03-05 NOTE — Progress Notes (Signed)
Physical Therapy Treatment Patient Details Name: John Ware MRN: [id number] DOB: 10-30-1965 Today's Date: 03/05/2021    History of Present Illness 55 year old with history of chronic pain, HTN, DM2, COVID-positive at home 5/20 reporting of body pain, shoulder pain eventually came to the ER.  Found to have MSSA bacteremia started on IV Ancef.  Large fluid collection on the left lateral posterior chest and back was noted.  Underwent MRI T-spine and CT CAP showing large multiloculated effusion  and osteomyelitis of thoracic spine.  There was concerns for septic emboli but echo was negative for TEE performed on 6/13 showed large tricuspid vegetation.  General surgery and orthopedic consulted.  Underwent left shoulder debridement 02/08/2021.  Postop had increasing size of his abscess collection therefore general surgery performed repeat debridement with wound VAC placement.  Dressing management per surgical team.  CT maxillofacial showed oral lucencies therefore oral surgery consulted.  Oral surgery did not think this was acute and Low suspicion for source per ID.  Underwent repeat I&D by Ortho on 02/28/2021.    PT Comments    Pt sitting in standard armchair with spouse [name] in room. Pt stated that he liked the standard chair better than recliner as it doesn't hurt his low back as much. Assisted with amb in hallway. Pt required min guard to ambulate 400 feet safely with straight cane. Some mild LOB noted when pt looks laterally while ambulating. General gait stability has improved since last session as noted by increased distance. Pt still requires rest breaks to stretch his low back due to complaints of pain. Pt did request to hold IV pole for last 75 feet of ambulation for increased support. Assisted back to standard chair.   Follow Up Recommendations        Equipment Recommendations  Rolling walker with 5" wheels    Recommendations for Other Services       Precautions / Restrictions  Precautions Precautions: Fall Precaution Comments: pt has been WBAT B UE's with slings for "comfort" but has not needed them.  New Ortho PN from 7/6 states B UE's NWB Restrictions Weight Bearing Restrictions: No Other Position/Activity Restrictions: awaiting clarification reguarding WBing of UE's    Mobility  Bed Mobility                    Transfers Overall transfer level: Needs assistance Equipment used: None;Straight cane Transfers: Sit to/from Stand Sit to Stand: Min guard Stand pivot transfers: Supervision;Min guard       General transfer comment: Min guard to stand from chair ambulated to snack machienes on 4th floor lobby with cane. Some LOB with looking side to side while ambulating. Needed standing rest break intermiddently to rest and stretch back.  Ambulation/Gait Ambulation/Gait assistance: Min guard Gait Distance (Feet): 400 Feet Assistive device: Straight cane Gait Pattern/deviations: Step-through pattern;Decreased stride length;Trunk flexed Gait velocity: decreased       Stairs             Wheelchair Mobility    Modified Rankin (Stroke Patients Only)       Balance Overall balance assessment: Mild deficits observed, not formally tested                                          Cognition Arousal/Alertness: Awake/alert Behavior During Therapy: WFL for tasks assessed/performed;Restless Overall Cognitive Status: Within Functional Limits for tasks assessed  Exercises      General Comments        Pertinent Vitals/Pain Pain Assessment: Faces Faces Pain Scale: Hurts little more Pain Location: L low back Pain Descriptors / Indicators: Grimacing;Aching Pain Intervention(s): Monitored during session;Limited activity within patient's tolerance    Home Living                      Prior Function            PT Goals (current goals can now be found in  the care plan section) Acute Rehab PT Goals Patient Stated Goal: to go home at discharge PT Goal Formulation: With patient Time For Goal Achievement: 02/23/21 Potential to Achieve Goals: Good    Frequency    Min 3X/week      PT Plan      Co-evaluation              AM-PAC PT "6 Clicks" Mobility   Outcome Measure  Help needed turning from your back to your side while in a flat bed without using bedrails?: A Little Help needed moving from lying on your back to sitting on the side of a flat bed without using bedrails?: A Little Help needed moving to and from a bed to a chair (including a wheelchair)?: A Little Help needed standing up from a chair using your arms (e.g., wheelchair or bedside chair)?: A Little Help needed to walk in hospital room?: A Little Help needed climbing 3-5 steps with a railing? : A Little 6 Click Score: 18    End of Session Equipment Utilized During Treatment: Gait belt Activity Tolerance: Patient tolerated treatment well Patient left: in chair;with family/visitor present Nurse Communication: Mobility status PT Visit Diagnosis: Unsteadiness on feet (R26.81);History of falling (Z91.81);Difficulty in walking, not elsewhere classified (R26.2)     Time: 1245-8099 PT Time Calculation (min) (ACUTE ONLY): 27 min  Charges:  $Gait Training: 23-37 mins                     Alma Friendly, PTA Student  Acute Rehabilitation Services Pager : [phone] Office : [phone]    Alma Friendly 03/05/2021, 3:48 PM

## 2021-03-05 NOTE — Plan of Care (Signed)

## 2021-03-06 ENCOUNTER — Encounter (HOSPITAL_COMMUNITY)
Admission: EM | Disposition: A | Discharge status: Home Health | Payer: Self-pay | Source: Home / Self Care | Attending: Internal Medicine

## 2021-03-06 ENCOUNTER — Inpatient Hospital Stay (HOSPITAL_COMMUNITY): Payer: Commercial Managed Care - PPO | Admitting: Certified Registered Nurse Anesthetist

## 2021-03-06 HISTORY — PX: IRRIGATION AND DEBRIDEMENT SHOULDER: SHX5880

## 2021-03-06 LAB — BASIC METABOLIC PANEL
Anion gap: 9 (ref 5–15)
BUN: 14 mg/dL (ref 6–20)
CO2: 28 mmol/L (ref 22–32)
Calcium: 8.5 mg/dL — ABNORMAL LOW (ref 8.9–10.3)
Chloride: 98 mmol/L (ref 98–111)
Creatinine, Ser: 0.4 mg/dL — ABNORMAL LOW (ref 0.61–1.24)
GFR, Estimated: >60 mL/min (ref >60)
Glucose, Bld: 144 mg/dL — ABNORMAL HIGH (ref 70–99)
Potassium: 4 mmol/L (ref 3.5–5.1)
Sodium: 135 mmol/L (ref 135–145)

## 2021-03-06 LAB — SURGICAL PCR SCREEN
MRSA, PCR: NEGATIVE
Staphylococcus aureus: NEGATIVE

## 2021-03-06 LAB — CBC WITH DIFFERENTIAL/PLATELET
Abs Immature Granulocytes: 0.05 10*3/uL (ref 0.00–0.07)
Basophils Absolute: 0.1 10*3/uL (ref 0.0–0.1)
Basophils Relative: 1 %
Eosinophils Absolute: 0.3 10*3/uL (ref 0.0–0.5)
Eosinophils Relative: 3 %
HCT: 25.1 % — ABNORMAL LOW (ref 39.0–52.0)
Hemoglobin: 7.9 g/dL — ABNORMAL LOW (ref 13.0–17.0)
Immature Granulocytes: 1 %
Lymphocytes Relative: 15 %
Lymphs Abs: 1.5 10*3/uL (ref 0.7–4.0)
MCH: 28.3 pg (ref 26.0–34.0)
MCHC: 31.5 g/dL (ref 30.0–36.0)
MCV: 90 fL (ref 80.0–100.0)
Monocytes Absolute: 1.1 10*3/uL — ABNORMAL HIGH (ref 0.1–1.0)
Monocytes Relative: 12 %
Neutro Abs: 6.8 10*3/uL (ref 1.7–7.7)
Neutrophils Relative %: 68 %
Platelets: 518 10*3/uL — ABNORMAL HIGH (ref 150–400)
RBC: 2.79 MIL/uL — ABNORMAL LOW (ref 4.22–5.81)
RDW: 15.3 % (ref 11.5–15.5)
WBC: 9.8 10*3/uL (ref 4.0–10.5)
nRBC: 0 % (ref 0.0–0.2)

## 2021-03-06 LAB — GLUCOSE, CAPILLARY
Glucose-Capillary: 137 mg/dL — ABNORMAL HIGH (ref 70–99)
Glucose-Capillary: 108 mg/dL — ABNORMAL HIGH (ref 70–99)
Glucose-Capillary: 219 mg/dL — ABNORMAL HIGH (ref 70–99)
Glucose-Capillary: 268 mg/dL — ABNORMAL HIGH (ref 70–99)

## 2021-03-06 SURGERY — IRRIGATION AND DEBRIDEMENT SHOULDER
Anesthesia: General | Site: Shoulder | Laterality: Left

## 2021-03-06 MED ORDER — PHENYLEPHRINE HCL (PRESSORS) 10 MG/ML IV SOLN
INTRAVENOUS | Status: DC | PRN
  Administered 2021-03-06 (×3): 80 ug via INTRAVENOUS

## 2021-03-06 MED ORDER — SODIUM CHLORIDE 0.9 % IR SOLN
Status: DC | PRN
  Administered 2021-03-06 (×2): 3000 mL

## 2021-03-06 MED ORDER — LIDOCAINE 2% (20 MG/ML) 5 ML SYRINGE
INTRAMUSCULAR | Status: AC
  Filled 2021-03-06: qty 5

## 2021-03-06 MED ORDER — OXYCODONE HCL 5 MG/5ML PO SOLN: 5.0000 mg | Freq: Once | ORAL | Status: DC | PRN

## 2021-03-06 MED ORDER — ACETAMINOPHEN 500 MG PO TABS: 1000.0000 mg | ORAL_TABLET | Freq: Once | ORAL | Status: DC | PRN

## 2021-03-06 MED ORDER — ENSURE PRE-SURGERY PO LIQD
296.0000 mL | Freq: Once | ORAL | Status: AC
  Administered 2021-03-06: 296 mL via ORAL
  Filled 2021-03-06: qty 296

## 2021-03-06 MED ORDER — FENTANYL CITRATE (PF) 100 MCG/2ML IJ SOLN
INTRAMUSCULAR | Status: AC
  Filled 2021-03-06: qty 4

## 2021-03-06 MED ORDER — FENTANYL CITRATE (PF) 100 MCG/2ML IJ SOLN
25.0000 ug | INTRAMUSCULAR | Status: DC | PRN
  Administered 2021-03-06: 25 ug via INTRAVENOUS
  Administered 2021-03-06: 50 ug via INTRAVENOUS
  Administered 2021-03-06: 25 ug via INTRAVENOUS

## 2021-03-06 MED ORDER — DEXAMETHASONE SODIUM PHOSPHATE 10 MG/ML IJ SOLN
INTRAMUSCULAR | Status: AC
  Filled 2021-03-06: qty 1

## 2021-03-06 MED ORDER — ONDANSETRON HCL 4 MG/2ML IJ SOLN
INTRAMUSCULAR | Status: DC | PRN
  Administered 2021-03-06: 4 mg via INTRAVENOUS

## 2021-03-06 MED ORDER — PROPOFOL 10 MG/ML IV BOLUS
INTRAVENOUS | Status: AC
  Filled 2021-03-06: qty 20

## 2021-03-06 MED ORDER — MIDAZOLAM HCL 2 MG/2ML IJ SOLN
INTRAMUSCULAR | Status: AC
  Filled 2021-03-06: qty 2

## 2021-03-06 MED ORDER — ACETAMINOPHEN 160 MG/5ML PO SOLN: 1000.0000 mg | Freq: Once | ORAL | Status: DC | PRN

## 2021-03-06 MED ORDER — ACETAMINOPHEN 10 MG/ML IV SOLN: 1000.0000 mg | Freq: Once | INTRAVENOUS | Status: DC | PRN

## 2021-03-06 MED ORDER — FENTANYL CITRATE (PF) 100 MCG/2ML IJ SOLN
INTRAMUSCULAR | Status: DC | PRN
  Administered 2021-03-06: 25 ug via INTRAVENOUS
  Administered 2021-03-06: 50 ug via INTRAVENOUS
  Administered 2021-03-06: 25 ug via INTRAVENOUS

## 2021-03-06 MED ORDER — ONDANSETRON HCL 4 MG/2ML IJ SOLN
INTRAMUSCULAR | Status: AC
  Filled 2021-03-06: qty 2

## 2021-03-06 MED ORDER — MIDAZOLAM HCL 5 MG/5ML IJ SOLN
INTRAMUSCULAR | Status: DC | PRN
  Administered 2021-03-06: 1 mg via INTRAVENOUS

## 2021-03-06 MED ORDER — LIDOCAINE HCL (CARDIAC) PF 100 MG/5ML IV SOSY
PREFILLED_SYRINGE | INTRAVENOUS | Status: DC | PRN
  Administered 2021-03-06: 100 mg via INTRAVENOUS

## 2021-03-06 MED ORDER — ROCURONIUM BROMIDE 100 MG/10ML IV SOLN
INTRAVENOUS | Status: DC | PRN
  Administered 2021-03-06: 60 mg via INTRAVENOUS

## 2021-03-06 MED ORDER — OXYCODONE HCL 5 MG PO TABS: 5.0000 mg | ORAL_TABLET | Freq: Once | ORAL | Status: DC | PRN

## 2021-03-06 MED ORDER — DEXAMETHASONE SODIUM PHOSPHATE 10 MG/ML IJ SOLN
INTRAMUSCULAR | Status: DC | PRN
  Administered 2021-03-06: 10 mg via INTRAVENOUS

## 2021-03-06 MED ORDER — ROCURONIUM BROMIDE 10 MG/ML (PF) SYRINGE
PREFILLED_SYRINGE | INTRAVENOUS | Status: AC
  Filled 2021-03-06: qty 10

## 2021-03-06 MED ORDER — PHENYLEPHRINE 40 MCG/ML (10ML) SYRINGE FOR IV PUSH (FOR BLOOD PRESSURE SUPPORT)
PREFILLED_SYRINGE | INTRAVENOUS | Status: AC
  Filled 2021-03-06: qty 20

## 2021-03-06 MED ORDER — LACTATED RINGERS IV SOLN: INTRAVENOUS | Status: DC

## 2021-03-06 MED ORDER — FENTANYL CITRATE (PF) 100 MCG/2ML IJ SOLN
INTRAMUSCULAR | Status: AC
  Filled 2021-03-06: qty 2

## 2021-03-06 MED ORDER — PROPOFOL 10 MG/ML IV BOLUS
INTRAVENOUS | Status: DC | PRN
  Administered 2021-03-06: 40 mg via INTRAVENOUS
  Administered 2021-03-06: 100 mg via INTRAVENOUS

## 2021-03-06 MED ORDER — SUGAMMADEX SODIUM 200 MG/2ML IV SOLN
INTRAVENOUS | Status: DC | PRN
  Administered 2021-03-06: 200 mg via INTRAVENOUS

## 2021-03-06 MED ORDER — POTASSIUM CHLORIDE IN NACL 20-0.45 MEQ/L-% IV SOLN
INTRAVENOUS | Status: DC
  Filled 2021-03-06 (×2): qty 1000

## 2021-03-06 SURGICAL SUPPLY — 48 items
BOOTIES KNEE HIGH SLOAN (MISCELLANEOUS) ×4 IMPLANT
CANISTER WOUND CARE 500ML ATS (WOUND CARE) ×4 IMPLANT
COVER SURGICAL LIGHT HANDLE (MISCELLANEOUS) ×2 IMPLANT
DRAPE ORTHO SPLIT 77X108 STRL (DRAPES) ×4
DRAPE SURG ORHT 6 SPLT 77X108 (DRAPES) IMPLANT
DRAPE U-SHAPE 47X51 STRL (DRAPES) ×2 IMPLANT
DRSG MEPILEX BORDER 4X8 (GAUZE/BANDAGES/DRESSINGS) IMPLANT
DRSG PAD ABDOMINAL 8X10 ST (GAUZE/BANDAGES/DRESSINGS) ×4 IMPLANT
DRSG VAC ATS MED SENSATRAC (GAUZE/BANDAGES/DRESSINGS) ×1 IMPLANT
DURAPREP 26ML APPLICATOR (WOUND CARE) ×2 IMPLANT
ELECT REM PT RETURN 15FT ADLT (MISCELLANEOUS) IMPLANT
EVACUATOR 1/8 PVC DRAIN (DRAIN) ×1 IMPLANT
GAUZE SPONGE 4X4 12PLY STRL (GAUZE/BANDAGES/DRESSINGS) ×2 IMPLANT
GAUZE XEROFORM 1X8 LF (GAUZE/BANDAGES/DRESSINGS) ×2 IMPLANT
GLOVE SRG 8 PF TXTR STRL LF DI (GLOVE) ×1 IMPLANT
GLOVE SURG ENC MOIS LTX SZ7 (GLOVE) ×2 IMPLANT
GLOVE SURG ORTHO LTX SZ7.5 (GLOVE) ×2 IMPLANT
GLOVE SURG UNDER POLY LF SZ7 (GLOVE) ×2 IMPLANT
GLOVE SURG UNDER POLY LF SZ8 (GLOVE) ×2
GOWN STRL REUS W/ TWL XL LVL3 (GOWN DISPOSABLE) ×1 IMPLANT
GOWN STRL REUS W/TWL 2XL LVL3 (GOWN DISPOSABLE) ×1 IMPLANT
GOWN STRL REUS W/TWL XL LVL3 (GOWN DISPOSABLE) ×2
HANDPIECE INTERPULSE COAX TIP (DISPOSABLE)
KIT BASIN OR (CUSTOM PROCEDURE TRAY) ×2 IMPLANT
KIT TURNOVER KIT A (KITS) ×2 IMPLANT
MANIFOLD NEPTUNE II (INSTRUMENTS) ×2 IMPLANT
NS IRRIG 1000ML POUR BTL (IV SOLUTION) ×2 IMPLANT
PACK SHOULDER (CUSTOM PROCEDURE TRAY) ×2 IMPLANT
PAD ARMBOARD 7.5X6 YLW CONV (MISCELLANEOUS) ×2 IMPLANT
SET HNDPC FAN SPRY TIP SCT (DISPOSABLE) IMPLANT
SET IRRIG Y TYPE TUR BLADDER L (SET/KITS/TRAYS/PACK) ×2 IMPLANT
SPONGE T-LAP 18X18 ~~LOC~~+RFID (SPONGE) ×4 IMPLANT
SUPPORT WRAP ARM LG (MISCELLANEOUS) IMPLANT
SUT ETHILON 2 0 PS N (SUTURE) ×1 IMPLANT
SUT ETHILON 3 0 PS 1 (SUTURE) IMPLANT
SUT VIC AB 0 CT1 27 (SUTURE)
SUT VIC AB 0 CT1 27XBRD ANBCTR (SUTURE) IMPLANT
SUT VIC AB 2-0 CT1 27 (SUTURE)
SUT VIC AB 2-0 CT1 TAPERPNT 27 (SUTURE) IMPLANT
SWAB COLLECTION DEVICE MRSA (MISCELLANEOUS) ×1 IMPLANT
SWAB CULTURE ESWAB REG 1ML (MISCELLANEOUS) IMPLANT
TAPE CLOTH SURG 6X10 WHT LF (GAUZE/BANDAGES/DRESSINGS) ×1 IMPLANT
TOWEL OR 17X26 10 PK STRL BLUE (TOWEL DISPOSABLE) ×2 IMPLANT
TOWEL OR NON WOVEN STRL DISP B (DISPOSABLE) ×2 IMPLANT
TUBING CONNECTING 10 (TUBING) ×2 IMPLANT
UNDERPAD 30X36 HEAVY ABSORB (UNDERPADS AND DIAPERS) ×2 IMPLANT
WATER STERILE IRR 1000ML POUR (IV SOLUTION) ×2 IMPLANT
YANKAUER SUCT BULB TIP NO VENT (SUCTIONS) ×2 IMPLANT

## 2021-03-06 NOTE — Interval H&P Note (Signed)
History and Physical Interval Note:  03/06/2021 2:27 PM  John Ware  has presented today for surgery, with the diagnosis of LEFT SHOULDER INFECTION.  The various methods of treatment have been discussed with the patient and family. After consideration of risks, benefits and other options for treatment, the patient has consented to  Procedure(s): IRRIGATION AND DEBRIDEMENT SHOULDER (Left) as a surgical intervention.  The patient's history has been reviewed, patient examined, no change in status, stable for surgery.  I have reviewed the patient's chart and labs.  Questions were answered to the patient's satisfaction.     [name]

## 2021-03-06 NOTE — Progress Notes (Signed)
PROGRESS NOTE    John Ware  ZOX:[id number] DOB: 06/14/1966 DOA: 01/31/2021 PCP: Junie SpencerHawks, Christy A, FNP   Brief Narrative:  55 year old with history of chronic pain, HTN, DM2, COVID-positive at home 5/20 reporting of body pain, shoulder pain eventually came to the ER.  Found to have MSSA bacteremia started on IV Ancef.  Large fluid collection on the left lateral posterior chest and back was noted.  Underwent MRI T-spine and CT CAP showing large multiloculated effusion  and osteomyelitis of thoracic spine.  There was concerns for septic emboli but echo was negative for TEE performed on 6/13 showed large tricuspid vegetation.  General surgery and orthopedic consulted.  Underwent left shoulder debridement 02/08/2021.  Postop had increasing size of his abscess collection therefore general surgery performed repeat debridement with wound VAC placement.  Dressing management per surgical team.  CT maxillofacial showed oral lucencies therefore oral surgery consulted.  Oral surgery did not think this was acute and Low suspicion for source per ID.  Underwent repeat I&D by Ortho on 02/28/2021.  Culture showing Pseudomonas.  Antibiotics switched to cefepime on 03/04/2020.   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  Large left posterior wall and bilateral psoas muscle abscess, bilateral septic shoulder joint, osteomyelitis and discitis.  Also possible floaters but endopthalmitis was ruled out. - Left shoulder  debridement 6/16, right shoulder debridement 6/17, drain placement by IR 6/24.  Status post bilateral shoulder debridement with wound VAC placement 6/29.  Pathology for necrotic lymph node -benign adipose tissue - IV Ancef.  ID following.  No end date planned at this time. - PCA for pain control. Oxycodone 10mg  q4hrs prn,. -Repeat CT shoulder performed.  Status post repeat I&D 7/6, cultures growing Pseudomonas.  Antibiotics transitioned to cefepime on 03/07/2021.  Patient underwent another irrigation and debridement of the left shoulder on 03/06/2021.  Has wound VAC attached on the left shoulder along with Hemovacs bilaterally.  Essential hypertension: Takes amlodipine and lisinopril at home.  Both on hold.  Blood pressure controlled.  Poor dentition with dental lucencies per CT scan. Oral surgery, Dr Ross MarcusSherwood didn't think there is anything acute. Low suspicious for potential source for ID.  Follow-up outpatient dentist  Anemia of chronic disease.   -Oral iron supplements with bowel regimen.  Status post 2 units PRBC 6/30.  Hemoglobin is stable over 7.  Closely monitor.   DKA.  Resolved.  Type 2 diabetes mellitus.   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   Blood sugar fairly controlled. continue reduced dose of gabapentin. Continue Lantus 20 units daily and SSI.   Ascending aorta dilatation (HCC) - TTE on 6/10 notes mild dilation of ascending aorta, 38 mm - TEE shows normal thoracic and ascending aorta noted on 02/05/21   Anxiety Continue Wellbutrin and continue to hold Zoloft.  Monitor sodium.   COVID-19 infection-resolved  Chronic hyponatremia: At his baseline.  DVT prophylaxis: Lovenox Code Status: Full Family Communication:  None present at bedside, patient fully alert and oriented  Status is: Inpatient  Remains inpatient appropriate because:Inpatient level of care appropriate due to severity of illness.  Maintain  hospital stay until cleared by orthopedic and general surgery.  Complex surgical case with multiple wounds requiring debridement  Dispo: The patient is from: Home              Anticipated d/c is to: SNF              Patient currently is not medically stable to d/c.   Difficult to place patient No     Body mass index is 29.43 kg/m.  Pressure Injury 01/31/21 Buttocks Right;Left;Posterior Stage 2 -  Partial thickness loss of dermis presenting as a shallow open injury with a red, pink wound bed without slough. (Active)  01/31/21 1546  Location: Buttocks  Location Orientation: Right;Left;Posterior  Staging: Stage 2 -  Partial thickness loss of dermis presenting as a shallow open injury with a red, pink wound bed without slough.  Wound Description (Comments):   Present on Admission: Yes      Subjective: Seen and examined after surgery.  Family members at the bedside.  Patient has minimal left shoulder pain otherwise no other complaint.  Examination: General exam: Appears calm and comfortable  Respiratory system: Clear to auscultation. Respiratory effort normal. Cardiovascular system: S1 & S2 heard, RRR. No JVD, murmurs, rubs, gallops or clicks. No pedal edema. Gastrointestinal system: Abdomen is nondistended, soft and nontender. No organomegaly or masses felt. Normal bowel sounds heard. Central nervous system: Alert and oriented. No focal neurological deficits. Skin: No rashes, lesions or ulcers.  Psychiatry: Judgement and insight appear normal. Mood & affect appropriate.    Objective: Vitals:   03/06/21 1430 03/06/21 1445 03/06/21 1450 03/06/21 1453  BP: 129/80  127/88   Pulse: 88  81   Resp: 12 16 18 16   Temp:   98.1 F (36.7 C)   TempSrc:   Oral   SpO2: 100%  100% 99%  Weight:      Height:        Intake/Output Summary (Last 24 hours) at 03/06/2021 1521 Last data filed at 03/06/2021 1327 Gross per 24 hour  Intake 1538.17 ml  Output 2350 ml  Net -811.83 ml    Filed  Weights   02/08/21 1540 02/20/21 1419 02/27/21 1443  Weight: 97.3 kg 97.3 kg 93 kg     Data Reviewed:   CBC: Recent Labs  Lab 03/02/21 0359 03/03/21 0446 03/04/21 0305 03/05/21 0340 03/06/21 0402  WBC 9.1 9.4 11.5* 10.4 9.8  NEUTROABS  --   --   --  7.4 6.8  HGB 7.6* 7.6* 7.5* 7.5* 7.9*  HCT 24.0* 24.2* 24.5* 23.8* 25.1*  MCV 92.0 91.0 90.7 90.2 90.0  PLT 588* 577* 569* 536* 518*    Basic Metabolic Panel: Recent Labs  Lab 02/28/21 0753 03/01/21 0334 03/02/21 0359 03/03/21 0446 03/04/21 0305 03/05/21 0340 03/06/21 0402  NA 131* 134* 135 136 134* 137 135  K 4.6 4.1 3.8 3.9 3.8 3.8 4.0  CL 93* 96* 97* 98 97* 99 98  CO2 26 31 31 30 29 29 28   GLUCOSE 119* 103* 101* 97 116* 136* 144*  BUN 10 8 11 14 14 13 14   CREATININE 0.54* 0.33* 0.49* 0.53* 0.45* 0.38* 0.40*  CALCIUM 8.6* 8.0* 8.0* 8.3* 8.2* 8.4* 8.5*  MG 1.7 1.9 1.8 1.9 1.9  --   --     GFR: Estimated Creatinine Clearance:  122.3 mL/min (A) (by C-G formula based on SCr of 0.4 mg/dL (L)). Liver Function Tests: No results for input(s): AST, ALT, ALKPHOS, BILITOT, PROT, ALBUMIN in the last 168 hours. No results for input(s): LIPASE, AMYLASE in the last 168 hours. No results for input(s): AMMONIA in the last 168 hours. Coagulation Profile: No results for input(s): INR, PROTIME in the last 168 hours. Cardiac Enzymes: No results for input(s): CKTOTAL, CKMB, CKMBINDEX, TROPONINI in the last 168 hours. BNP (last 3 results) No results for input(s): PROBNP in the last 8760 hours. HbA1C: No results for input(s): HGBA1C in the last 72 hours. CBG: Recent Labs  Lab 03/05/21 1214 03/05/21 1701 03/05/21 2133 03/06/21 0712 03/06/21 1354  GLUCAP 169* 86 173* 137* 108*    Lipid Profile: No results for input(s): CHOL, HDL, LDLCALC, TRIG, CHOLHDL, LDLDIRECT in the last 72 hours. Thyroid Function Tests: No results for input(s): TSH, T4TOTAL, FREET4, T3FREE, THYROIDAB in the last 72 hours. Anemia Panel: No results for  input(s): VITAMINB12, FOLATE, FERRITIN, TIBC, IRON, RETICCTPCT in the last 72 hours.  Sepsis Labs: No results for input(s): PROCALCITON, LATICACIDVEN in the last 168 hours.  Recent Results (from the past 240 hour(s))  Aerobic/Anaerobic Culture w Gram Stain (surgical/deep wound)     Status: None   Collection Time: 02/28/21  4:11 PM   Specimen: PATH Soft tissue  Result Value Ref Range Status   Specimen Description   Final    TISSUE LEFT SHOULDER A Performed at [facility], [address], [place], [place] [address]    Special Requests   Final    NONE Performed at [facility]: [facility] [address], [address] [place], [place] [address]    Gram Stain   Final    RARE WBC PRESENT, PREDOMINANTLY PMN NO ORGANISMS SEEN    Culture   Final    RARE PSEUDOMONAS AERUGINOSA NO ANAEROBES ISOLATED Performed at [facility], [address], [place], [place] [address]    Report Status 03/05/2021 FINAL  Final   Organism ID, Bacteria PSEUDOMONAS AERUGINOSA  Final      Susceptibility   Pseudomonas aeruginosa - MIC*    CEFTAZIDIME 4 SENSITIVE Sensitive     CIPROFLOXACIN <=0.25 SENSITIVE Sensitive     GENTAMICIN <=1 SENSITIVE Sensitive     IMIPENEM 1 SENSITIVE Sensitive     PIP/TAZO 8 SENSITIVE Sensitive     CEFEPIME 2 SENSITIVE Sensitive     * RARE PSEUDOMONAS AERUGINOSA  Aerobic/Anaerobic Culture w Gram Stain (surgical/deep wound)     Status: None   Collection Time: 02/28/21  4:12 PM   Specimen: PATH Soft tissue  Result Value Ref Range Status   Specimen Description   Final    TISSUE  RIGHT SHOULDER Performed at [facility], [address] [place], [place] [address]    Special Requests   Final    NONE Performed at [facility], [address], [place], [place] [address]    Gram Stain NO WBC SEEN NO ORGANISMS SEEN   Final   Culture   Final    No growth aerobically or anaerobically. Performed at [facility], [address] [place], [place] [address]    Report Status 03/05/2021 FINAL  Final  Surgical pcr screen     Status: None   Collection Time: 03/06/21  3:07 AM   Specimen: Nasal Mucosa; Nasal Swab  Result Value Ref Range Status   MRSA, PCR NEGATIVE NEGATIVE Final  Staphylococcus aureus NEGATIVE NEGATIVE Final    Comment: (NOTE) The Xpert SA Assay (FDA approved for NASAL specimens in patients 61 years of age and older), is one component of a comprehensive surveillance program. It is not intended to diagnose infection nor to guide or monitor treatment. Performed at [facility], [address], [place], [place] [address]           Radiology Studies: No results found.      Scheduled Meds:  (feeding supplement) PROSource Plus  30 mL Oral TID BM   sodium chloride   Intravenous Once   acetaminophen  500 mg Oral QID   vitamin C  500 mg Oral BID   buPROPion  150 mg Oral BID   Chlorhexidine Gluconate Cloth  6 each Topical Daily   enoxaparin (LOVENOX) injection  40 mg Subcutaneous Q24H   feeding supplement  237 mL Oral Q24H   fentaNYL       ferrous sulfate  325 mg Oral BID WC   gabapentin  200 mg Oral BID   HYDROmorphone   Intravenous Q4H   insulin aspart  0-15 Units Subcutaneous TID WC   insulin aspart  0-5 Units Subcutaneous QHS   insulin glargine  20 Units Subcutaneous Daily   mouth rinse  15 mL Mouth Rinse BID   nutrition supplement (JUVEN)  1 packet Oral BID BM   polyethylene glycol  17 g Oral Daily   senna-docusate  1 tablet Oral BID   sodium chloride flush  5 mL Intracatheter Q8H   zinc sulfate  220 mg Oral Daily   Continuous Infusions:  0.45 % NaCl with KCl 20 mEq / L     sodium chloride 250 mL (03/06/21 0417)   ceFEPime (MAXIPIME) IV 2 g (03/06/21 0542)     LOS: 34 days   Time spent= 30 mins  Hughie Closs, MD [facility]  If 7PM-7AM, please contact night-coverage  03/06/2021, 3:21 PM

## 2021-03-06 NOTE — Transfer of Care (Signed)
Immediate Anesthesia Transfer of Care Note  Patient: John Ware  Procedure(s) Performed: IRRIGATION AND DEBRIDEMENT SHOULDER (Left: Shoulder)  Patient Location: PACU  Anesthesia Type:General  Level of Consciousness: awake and patient cooperative  Airway & Oxygen Therapy: Patient Spontanous Breathing and Patient connected to face mask oxygen  Post-op Assessment: Report given to RN and Post -op Vital signs reviewed and stable  Post vital signs: Reviewed, stable  Last Vitals:  Vitals Value Taken Time  BP 113/73 03/06/21 1348  Temp    Pulse 69 03/06/21 1351  Resp 18 03/06/21 1351  SpO2 100 % 03/06/21 1351  Vitals shown include unvalidated device data.  Last Pain:  Vitals:   03/06/21 0607  TempSrc: Oral  PainSc:       Patients Stated Pain Goal: 2 (03/05/21 1830)  Complications: No notable events documented.

## 2021-03-06 NOTE — Anesthesia Preprocedure Evaluation (Signed)
Anesthesia Evaluation  Patient identified by MRN, date of birth, ID band Patient awake    Reviewed: Allergy & Precautions, NPO status , Patient's Chart, lab work & pertinent test results  Airway Mallampati: I       Dental  (+) Chipped,    Pulmonary former smoker,    Pulmonary exam normal        Cardiovascular hypertension, Pt. on medications Normal cardiovascular exam     Neuro/Psych PSYCHIATRIC DISORDERS Anxiety Depression    GI/Hepatic negative GI ROS, Neg liver ROS,   Endo/Other  diabetes, Well Controlled, Type 2, Oral Hypoglycemic Agents  Renal/GU negative Renal ROS     Musculoskeletal   Abdominal Normal abdominal exam  (+)   Peds  Hematology  (+) anemia ,   Anesthesia Other Findings   Reproductive/Obstetrics                             Anesthesia Physical Anesthesia Plan  ASA: 2  Anesthesia Plan: General   Post-op Pain Management:    Induction: Intravenous  PONV Risk Score and Plan: 2 and Ondansetron and Dexamethasone  Airway Management Planned: Oral ETT  Additional Equipment: None  Intra-op Plan:   Post-operative Plan: Extubation in OR  Informed Consent: I have reviewed the patients History and Physical, chart, labs and discussed the procedure including the risks, benefits and alternatives for the proposed anesthesia with the patient or authorized representative who has indicated his/her understanding and acceptance.     Dental advisory given  Plan Discussed with: Surgeon and CRNA  Anesthesia Plan Comments:         Anesthesia Quick Evaluation

## 2021-03-06 NOTE — Anesthesia Procedure Notes (Signed)
Procedure Name: Intubation Date/Time: 03/06/2021 12:35 PM Performed by: Jari Pigg, CRNA Pre-anesthesia Checklist: Patient identified, Emergency Drugs available, Suction available and Patient being monitored Patient Re-evaluated:Patient Re-evaluated prior to induction Oxygen Delivery Method: Circle system utilized Preoxygenation: Pre-oxygenation with 100% oxygen Induction Type: IV induction Ventilation: Mask ventilation without difficulty Laryngoscope Size: Mac and 4 Grade View: Grade I Tube type: Oral Number of attempts: 1 Airway Equipment and Method: Stylet and Oral airway Placement Confirmation: ETT inserted through vocal cords under direct vision, positive ETCO2 and breath sounds checked- equal and bilateral Secured at: 22 cm Tube secured with: Tape Dental Injury: Teeth and Oropharynx as per pre-operative assessment

## 2021-03-07 ENCOUNTER — Encounter (HOSPITAL_COMMUNITY): Payer: Self-pay | Admitting: Orthopedic Surgery

## 2021-03-07 ENCOUNTER — Inpatient Hospital Stay (HOSPITAL_COMMUNITY): Payer: Commercial Managed Care - PPO

## 2021-03-07 LAB — GLUCOSE, CAPILLARY
Glucose-Capillary: 222 mg/dL — ABNORMAL HIGH (ref 70–99)
Glucose-Capillary: 151 mg/dL — ABNORMAL HIGH (ref 70–99)
Glucose-Capillary: 143 mg/dL — ABNORMAL HIGH (ref 70–99)
Glucose-Capillary: 222 mg/dL — ABNORMAL HIGH (ref 70–99)

## 2021-03-07 LAB — CBC WITH DIFFERENTIAL/PLATELET
Abs Immature Granulocytes: 0.07 10*3/uL (ref 0.00–0.07)
Basophils Absolute: 0 10*3/uL (ref 0.0–0.1)
Basophils Relative: 0 %
Eosinophils Absolute: 0 10*3/uL (ref 0.0–0.5)
Eosinophils Relative: 0 %
HCT: 24.4 % — ABNORMAL LOW (ref 39.0–52.0)
Hemoglobin: 7.8 g/dL — ABNORMAL LOW (ref 13.0–17.0)
Immature Granulocytes: 1 %
Lymphocytes Relative: 13 %
Lymphs Abs: 0.9 10*3/uL (ref 0.7–4.0)
MCH: 28.8 pg (ref 26.0–34.0)
MCHC: 32 g/dL (ref 30.0–36.0)
MCV: 90 fL (ref 80.0–100.0)
Monocytes Absolute: 0.4 10*3/uL (ref 0.1–1.0)
Monocytes Relative: 6 %
Neutro Abs: 5.7 10*3/uL (ref 1.7–7.7)
Neutrophils Relative %: 80 %
Platelets: 480 10*3/uL — ABNORMAL HIGH (ref 150–400)
RBC: 2.71 MIL/uL — ABNORMAL LOW (ref 4.22–5.81)
RDW: 15.2 % (ref 11.5–15.5)
WBC: 7.1 10*3/uL (ref 4.0–10.5)
nRBC: 0 % (ref 0.0–0.2)

## 2021-03-07 MED ORDER — CHLORHEXIDINE GLUCONATE 4 % EX LIQD
60.0000 mL | Freq: Once | CUTANEOUS | Status: AC
  Administered 2021-03-08: 4 via TOPICAL
  Filled 2021-03-07: qty 60

## 2021-03-07 MED ORDER — INSULIN GLARGINE 100 UNIT/ML ~~LOC~~ SOLN
25.0000 [IU] | Freq: Every day | SUBCUTANEOUS | Status: DC
  Administered 2021-03-07 – 2021-03-11 (×4): 25 [IU] via SUBCUTANEOUS
  Filled 2021-03-07 (×6): qty 0.25

## 2021-03-07 MED ORDER — GADOBUTROL 1 MMOL/ML IV SOLN
10.0000 mL | Freq: Once | INTRAVENOUS | Status: AC | PRN
  Administered 2021-03-07: 10 mL via INTRAVENOUS

## 2021-03-07 MED ORDER — POVIDONE-IODINE 10 % EX SWAB
2.0000 "application " | Freq: Once | CUTANEOUS | Status: AC
  Administered 2021-03-08: 2 via TOPICAL

## 2021-03-07 NOTE — Op Note (Signed)
03/06/2021  8:32 AM  PATIENT:  John Ware L Kolbe    PRE-OPERATIVE DIAGNOSIS:  LEFT SHOULDER INFECTION  POST-OPERATIVE DIAGNOSIS:  Same  PROCEDURE:  IRRIGATION AND DEBRIDEMENT SHOULDER  SURGEON:  [name], MD  ASSISTANT: Levester Fresh, PA-C, he was present and scrubbed throughout the case, critical for completion in a timely fashion, and for retraction, instrumentation, and closure.   ANESTHESIA:   gen  PREOPERATIVE INDICATIONS:  [name] is a  55 y.o. male with a diagnosis of LEFT SHOULDER INFECTION who failed conservative measures and elected for surgical management.    The risks benefits and alternatives were discussed with the patient preoperatively including but not limited to the risks of infection, bleeding, nerve injury, cardiopulmonary complications, the need for revision surgery, among others, and the patient was willing to proceed.  OPERATIVE IMPLANTS: wound vac  OPERATIVE FINDINGS: minimal purulence  BLOOD LOSS: min  COMPLICATIONS: none  TOURNIQUET TIME: none  OPERATIVE PROCEDURE:  Patient was identified in the preoperative holding area and site was marked by me He was transported to the operating theater and placed on the table in supine position taking care to pad all bony prominences. After a preincinduction time out anesthesia was induced. The left upper extremity was prepped and draped in normal sterile fashion and a pre-incision timeout was performed. He received scheduled abx for preoperative antibiotics.   He was placed in the beachchair position padding all bony prominences.  Wound VAC was removed and the left shoulder was prepped and draped in the normal sterile fashion  I explored his deltopectoral incision probed his performed an arthrotomy of his AC joint and irrigated this debrided some distal end of clavicle and it all appeared viable.  I then explored the glenohumeral joint and debrided here as well.  I explored the abscess in his  deltoid region no fluid collection there.  I irrigated with 6 L of saline  Negative pressure dressing was applied he was woken taken the PACU in stable condition  Debridement type: Excisional Debridement  Side: left  Body Location: shoulder   Tools used for debridement: curette and rongeur  Pre-debridement Wound size (cm):   Length: 15        Width: 4     Depth: 4   Post-debridement Wound size (cm):   Length: 15        Width: 4     Depth: 4   Debridement depth beyond dead/damaged tissue down to healthy viable tissue: yes  Tissue layer involved: skin, subcutaneous tissue, muscle / fascia, bone  Nature of tissue removed: Non-viable tissue and Purulence  Irrigation volume: 6L     Irrigation fluid type: Normal Saline    POST OPERATIVE PLAN: sling, repeat debridement scheduled, MRI of shoulder

## 2021-03-07 NOTE — Progress Notes (Signed)
OT Cancellation Note  Patient Details Name: PRIDE GONZALES MRN: [id number] DOB: October 20, 1965   Cancelled Treatment:    Reason Eval/Treat Not Completed: Other (comment) patient is scheduled to have another I & D on 7/14. Will follow up after surgery.   Sharyn Blitz OTR/L, MS Acute Rehabilitation Department Office# [phone] Pager# [phone]   Chalmers Guest Byanka Landrus 03/07/2021, 11:40 AM

## 2021-03-07 NOTE — Plan of Care (Signed)
  Problem: Education: Goal: Knowledge of General Education information will improve Description Including pain rating scale, medication(s)/side effects and non-pharmacologic comfort measures Outcome: Progressing   Problem: Activity: Goal: Risk for activity intolerance will decrease Outcome: Progressing   Problem: Pain Managment: Goal: General experience of comfort will improve Outcome: Progressing   Problem: Skin Integrity: Goal: Risk for impaired skin integrity will decrease Outcome: Progressing   

## 2021-03-07 NOTE — Progress Notes (Signed)
PROGRESS NOTE    GRANTLAND WANT  KGM:[id number] DOB: 03-05-1966 DOA: 01/31/2021 PCP: Junie Spencer, FNP   Brief Narrative:  55 year old with history of chronic pain, HTN, DM2, COVID-positive at home 5/20 reporting of body pain, shoulder pain eventually came to the ER.  Found to have MSSA bacteremia started on IV Ancef.  Large fluid collection on the left lateral posterior chest and back was noted.  Underwent MRI T-spine and CT CAP showing large multiloculated effusion  and osteomyelitis of thoracic spine.  There was concerns for septic emboli but echo was negative for TEE performed on 6/13 showed large tricuspid vegetation.  General surgery and orthopedic consulted.  Underwent left shoulder debridement 02/08/2021.  Postop had increasing size of his abscess collection therefore general surgery performed repeat debridement with wound VAC placement.  Dressing management per surgical team.  CT maxillofacial showed oral lucencies therefore oral surgery consulted.  Oral surgery did not think this was acute and Low suspicion for source per ID.  Underwent repeat I&D by Ortho on 02/28/2021.  Culture showing Pseudomonas.  Antibiotics switched to cefepime on 03/04/2020.Patient underwent another irrigation and debridement of the left shoulder on 03/06/2021.    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  Large left posterior wall and bilateral psoas muscle abscess, bilateral septic shoulder joint, osteomyelitis and  discitis.  Also possible floaters but endopthalmitis was ruled out. - Left shoulder debridement 6/16, right shoulder debridement 6/17, drain placement by IR 6/24.  Status post bilateral shoulder debridement with wound VAC placement 6/29.  Pathology for necrotic lymph node -benign adipose tissue - IV Ancef.  ID following.  No end date planned at this time. - PCA for pain control. Oxycodone 10mg  q4hrs prn,. -Repeat CT shoulder performed.  Status post repeat I&D 7/6, cultures growing Pseudomonas.  Antibiotics transitioned to cefepime on 03/07/2021.  Patient underwent another irrigation and debridement of the left shoulder on 03/06/2021.  Has wound VAC attached on the left shoulder along with Hemovacs bilaterally.  Per orthopedics, and other plan for an I&D tomorrow morning on the left shoulder and MRI today.  Essential hypertension: Takes amlodipine and lisinopril at home.  Both on hold.  Blood pressure controlled.  Poor dentition with dental lucencies per CT scan. Oral surgery, Dr 05/07/2021 didn't think there is anything acute. Low suspicious for potential source for ID.  Follow-up outpatient dentist  Anemia of chronic disease.   -Oral iron supplements with bowel regimen.  Status post 2 units PRBC 6/30.  Hemoglobin is stable over 7.  Closely monitor.   DKA.  Resolved.  Type 2 diabetes mellitus.   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   Blood sugar slightly elevated now in low 200 range.  Will increase Lantus from 20units to 25 units and continue SSI.   Ascending aorta dilatation (HCC) - TTE on 6/10 notes mild dilation of ascending aorta, 38 mm - TEE shows normal thoracic and ascending aorta  noted on 02/05/21   Anxiety Continue Wellbutrin and continue to hold Zoloft.  Monitor sodium.   COVID-19 infection-resolved  Chronic hyponatremia: At his baseline.  DVT prophylaxis: Lovenox Code Status: Full Family Communication: None present  at bedside, patient fully alert and oriented  Status is: Inpatient  Remains inpatient appropriate because:Inpatient level of care appropriate due to severity of illness.  Maintain hospital stay until cleared by orthopedic and general surgery.  Complex surgical case with multiple wounds requiring debridement  Dispo: The patient is from: Home              Anticipated d/c is to: SNF              Patient currently is not medically stable to d/c.   Difficult to place patient No     Body mass index is 29.43 kg/m.  Pressure Injury 01/31/21 Buttocks Right;Left;Posterior Stage 2 -  Partial thickness loss of dermis presenting as a shallow open injury with a red, pink wound bed without slough. (Active)  01/31/21 1546  Location: Buttocks  Location Orientation: Right;Left;Posterior  Staging: Stage 2 -  Partial thickness loss of dermis presenting as a shallow open injury with a red, pink wound bed without slough.  Wound Description (Comments):   Present on Admission: Yes      Subjective: Seen and examined.  Doing better.  In good spirits.  He knows the plan of end of debridement tomorrow morning.  Examination: General exam: Appears calm and comfortable  Respiratory system: Clear to auscultation. Respiratory effort normal. Cardiovascular system: S1 & S2 heard, RRR. No JVD, murmurs, rubs, gallops or clicks. No pedal edema. Gastrointestinal system: Abdomen is nondistended, soft and nontender. No organomegaly or masses felt. Normal bowel sounds heard. Central nervous system: Alert and oriented. No focal neurological deficits. Extremities: Wound VAC attached to the left shoulder with Hemovacs attached to both shoulders. Skin: No rashes, lesions or ulcers.  Psychiatry: Judgement and insight appear normal. Mood & affect appropriate.    Objective: Vitals:   03/07/21 0033 03/07/21 0255 03/07/21 0419 03/07/21 0543  BP:  94/64  107/69  Pulse:  67  71  Resp: 14 18 16    Temp:  97.6 F (36.4 C)     TempSrc:  Oral    SpO2: 99% 96% 97%   Weight:      Height:        Intake/Output Summary (Last 24 hours) at 03/07/2021 0757 Last data filed at 03/07/2021 0542 Gross per 24 hour  Intake 1926.66 ml  Output 1805 ml  Net 121.66 ml    Filed Weights   02/08/21 1540 02/20/21 1419 02/27/21 1443  Weight: 97.3 kg 97.3 kg 93 kg     Data Reviewed:   CBC: Recent Labs  Lab 03/03/21 0446 03/04/21 0305 03/05/21 0340 03/06/21 0402 03/07/21 0500  WBC 9.4 11.5* 10.4 9.8 7.1  NEUTROABS  --   --  7.4 6.8 5.7  HGB 7.6* 7.5* 7.5* 7.9* 7.8*  HCT 24.2* 24.5* 23.8* 25.1* 24.4*  MCV 91.0 90.7 90.2 90.0 90.0  PLT 577* 569* 536* 518* 480*    Basic Metabolic Panel: Recent Labs  Lab 03/01/21 0334 03/02/21 0359 03/03/21 0446 03/04/21 0305 03/05/21 0340 03/06/21 0402  NA 134* 135 136 134* 137 135  K 4.1 3.8 3.9 3.8 3.8 4.0  CL 96* 97* 98 97* 99 98  CO2 31 31 30 29 29 28   GLUCOSE 103* 101* 97 116* 136* 144*  BUN 8 11 14 14 13  14  CREATININE 0.33* 0.49* 0.53* 0.45* 0.38* 0.40*  CALCIUM 8.0* 8.0* 8.3* 8.2* 8.4* 8.5*  MG 1.9 1.8 1.9 1.9  --   --     GFR: Estimated Creatinine Clearance: 122.3 mL/min (A) (by C-G formula based on SCr of 0.4 mg/dL (L)). Liver Function Tests: No results for input(s): AST, ALT, ALKPHOS, BILITOT, PROT, ALBUMIN in the last 168 hours. No results for input(s): LIPASE, AMYLASE in the last 168 hours. No results for input(s): AMMONIA in the last 168 hours. Coagulation Profile: No results for input(s): INR, PROTIME in the last 168 hours. Cardiac Enzymes: No results for input(s): CKTOTAL, CKMB, CKMBINDEX, TROPONINI in the last 168 hours. BNP (last 3 results) No results for input(s): PROBNP in the last 8760 hours. HbA1C: No results for input(s): HGBA1C in the last 72 hours. CBG: Recent Labs  Lab 03/06/21 0712 03/06/21 1354 03/06/21 1620 03/06/21 2207 03/07/21 0743  GLUCAP 137* 108* 219* 268* 222*    Lipid Profile: No results for input(s): CHOL, HDL,  LDLCALC, TRIG, CHOLHDL, LDLDIRECT in the last 72 hours. Thyroid Function Tests: No results for input(s): TSH, T4TOTAL, FREET4, T3FREE, THYROIDAB in the last 72 hours. Anemia Panel: No results for input(s): VITAMINB12, FOLATE, FERRITIN, TIBC, IRON, RETICCTPCT in the last 72 hours.  Sepsis Labs: No results for input(s): PROCALCITON, LATICACIDVEN in the last 168 hours.  Recent Results (from the past 240 hour(s))  Aerobic/Anaerobic Culture w Gram Stain (surgical/deep wound)     Status: None   Collection Time: 02/28/21  4:11 PM   Specimen: PATH Soft tissue  Result Value Ref Range Status   Specimen Description   Final    TISSUE LEFT SHOULDER A Performed at [facility], [address] [place], [place] [address]    Special Requests   Final    NONE Performed at [facility], [address] [place], [place] [address]    Gram Stain   Final    RARE WBC PRESENT, PREDOMINANTLY PMN NO ORGANISMS SEEN    Culture   Final    RARE PSEUDOMONAS AERUGINOSA NO ANAEROBES ISOLATED Performed at [facility], [address], [place], [place] [address]    Report Status 03/05/2021 FINAL  Final   Organism ID, Bacteria PSEUDOMONAS AERUGINOSA  Final      Susceptibility   Pseudomonas aeruginosa - MIC*    CEFTAZIDIME 4 SENSITIVE Sensitive     CIPROFLOXACIN <=0.25 SENSITIVE Sensitive     GENTAMICIN <=1 SENSITIVE Sensitive     IMIPENEM 1 SENSITIVE Sensitive     PIP/TAZO 8 SENSITIVE Sensitive     CEFEPIME 2 SENSITIVE Sensitive     * RARE PSEUDOMONAS AERUGINOSA  Aerobic/Anaerobic Culture w Gram Stain (surgical/deep wound)     Status: None   Collection Time: 02/28/21  4:12 PM   Specimen: PATH Soft tissue  Result Value Ref Range Status   Specimen Description   Final    TISSUE  RIGHT SHOULDER Performed at [facility], [address] [place], [place] [address]    Special Requests   Final    NONE Performed at [facility],  [address], [place], [place] [address]    Gram Stain NO WBC SEEN NO ORGANISMS SEEN   Final   Culture   Final    No growth aerobically or anaerobically. Performed at [facility], [address] [place], [place] [address]    Report Status 03/05/2021 FINAL  Final  Surgical pcr screen  Status: None   Collection Time: 03/06/21  3:07 AM   Specimen: Nasal Mucosa; Nasal Swab  Result Value Ref Range Status   MRSA, PCR NEGATIVE NEGATIVE Final   Staphylococcus aureus NEGATIVE NEGATIVE Final    Comment: (NOTE) The Xpert SA Assay (FDA approved for NASAL specimens in patients 7 years of age and older), is one component of a comprehensive surveillance program. It is not intended to diagnose infection nor to guide or monitor treatment. Performed at [facility], [address] [place], [place] [address]           Radiology Studies: No results found.      Scheduled Meds:  (feeding supplement) PROSource Plus  30 mL Oral TID BM   sodium chloride   Intravenous Once   acetaminophen  500 mg Oral QID   vitamin C  500 mg Oral BID   buPROPion  150 mg Oral BID   Chlorhexidine Gluconate Cloth  6 each Topical Daily   enoxaparin (LOVENOX) injection  40 mg Subcutaneous Q24H   feeding supplement  237 mL Oral Q24H   ferrous sulfate  325 mg Oral BID WC   gabapentin  200 mg Oral BID   HYDROmorphone   Intravenous Q4H   insulin aspart  0-15 Units Subcutaneous TID WC   insulin aspart  0-5 Units Subcutaneous QHS   insulin glargine  25 Units Subcutaneous Daily   mouth rinse  15 mL Mouth Rinse BID   nutrition supplement (JUVEN)  1 packet Oral BID BM   polyethylene glycol  17 g Oral Daily   senna-docusate  1 tablet Oral BID   sodium chloride flush  5 mL Intracatheter Q8H   zinc sulfate  220 mg Oral Daily   Continuous Infusions:  0.45 % NaCl with KCl 20 mEq / L 50 mL/hr at 03/06/21 1624   sodium chloride 250 mL (03/06/21 0417)   ceFEPime (MAXIPIME) IV 2 g  (03/07/21 0542)     LOS: 35 days   Time spent= 28 mins  Hughie Closs, MD [facility]  If 7PM-7AM, please contact night-coverage  03/07/2021, 7:57 AM

## 2021-03-07 NOTE — Progress Notes (Signed)
Subjective:  Pain at bilateral shoulders has improved since this past wash out. L worse than right. No other complaints this morning.   Objective:  PE: VITALS:   Vitals:   03/07/21 0033 03/07/21 0255 03/07/21 0419 03/07/21 0543  BP:  94/64  107/69  Pulse:  67  71  Resp: 14 18 16    Temp:  97.6 F (36.4 C)    TempSrc:  Oral    SpO2: 99% 96% 97%   Weight:      Height:        MSK: 0-30 deg FF at right shoulder. 5 deg FF left shoulder. Axillary nerve sensation and deltoid firing intact bilaterally. Full ROM of bilateral elbows, wrist, and all fingers of hands. R shoulder incision healing well. L wound vac in place with good seal, 50 cc's in canister. Distal sensation and capillary refill intact. 5/5 grip strength bilaterally.   LABS  Results for orders placed or performed during the hospital encounter of 01/31/21 (from the past 24 hour(s))  Glucose, capillary     Status: Abnormal   Collection Time: 03/06/21  1:54 PM  Result Value Ref Range   Glucose-Capillary 108 (H) 70 - 99 mg/dL  Glucose, capillary     Status: Abnormal   Collection Time: 03/06/21  4:20 PM  Result Value Ref Range   Glucose-Capillary 219 (H) 70 - 99 mg/dL  Glucose, capillary     Status: Abnormal   Collection Time: 03/06/21 10:07 PM  Result Value Ref Range   Glucose-Capillary 268 (H) 70 - 99 mg/dL  CBC with Differential/Platelet     Status: Abnormal   Collection Time: 03/07/21  5:00 AM  Result Value Ref Range   WBC 7.1 4.0 - 10.5 K/uL   RBC 2.71 (L) 4.22 - 5.81 MIL/uL   Hemoglobin 7.8 (L) 13.0 - 17.0 g/dL   HCT 03/09/21 (L) 40.3 - 47.4 %   MCV 90.0 80.0 - 100.0 fL   MCH 28.8 26.0 - 34.0 pg   MCHC 32.0 30.0 - 36.0 g/dL   RDW 25.9 56.3 - 87.5 %   Platelets 480 (H) 150 - 400 K/uL   nRBC 0.0 0.0 - 0.2 %   Neutrophils Relative % 80 %   Neutro Abs 5.7 1.7 - 7.7 K/uL   Lymphocytes Relative 13 %   Lymphs Abs 0.9 0.7 - 4.0 K/uL   Monocytes Relative 6 %   Monocytes Absolute 0.4 0.1 - 1.0 K/uL   Eosinophils  Relative 0 %   Eosinophils Absolute 0.0 0.0 - 0.5 K/uL   Basophils Relative 0 %   Basophils Absolute 0.0 0.0 - 0.1 K/uL   Immature Granulocytes 1 %   Abs Immature Granulocytes 0.07 0.00 - 0.07 K/uL  Glucose, capillary     Status: Abnormal   Collection Time: 03/07/21  7:43 AM  Result Value Ref Range   Glucose-Capillary 222 (H) 70 - 99 mg/dL    No results found.  Assessment/Plan: Bilateral shoulder septic arthritis - L shoulder irrigation and debridement 6/16 - R shoulder irrigation and debridement with distal clavicle resection 6/17 - bilateral shoulder irrigation and debridement with wound vac placement bilaterally 6/29 - bilateral shoulder irrigation and debridement with closure and incisional wound vac placement, hemovac placement R shoulder, L wound vac and hemovac placement 7/7 - L shoulder irrigation and debridement with hemovac placement and wound vac placement 7/12 - IV antibiotics per ID, pseudomonas seen on most recent shoulder culture - As of right now plan is for  I&D tomorrow, depending on surgeon and OR availability. Keep wound vac to L shoulder on and in place. Both hemovacs are still producing fluid so will continue to keep these in place.  -waiting on results of MRI of L shoulder  Armida Sans 03/07/2021, 8:40 AM

## 2021-03-07 NOTE — Progress Notes (Signed)
Mobility Specialist - Progress Note    03/07/21 1559  Mobility  Activity Ambulated in hall  Level of Assistance Standby assist, set-up cues, supervision of patient - no hands on  Assistive Device None (Pushed IV pole)  Distance Ambulated (ft) 500 ft  Mobility Ambulated with assistance in hallway  Mobility Response Tolerated well  Mobility performed by Mobility specialist  $Mobility charge 1 Mobility   Pt ambulated ~561ft in hallway (from room to 4W nurse's station) while pushing IV pole for support. Pt tolerated session well and assisted mobility specialist with holding drains/lines. Pt did not c/o of SOB or dizziness during ambulation. Pt does have a wider base of support, possibly from pushing IV pole. Pt returned to room after ambulation and was left on couch with family in room.   Arliss Journey Mobility Specialist Acute Rehabilitation Services Office: [phone] 03/07/21, 4:03 PM

## 2021-03-07 NOTE — Progress Notes (Signed)
Pharmacy Antibiotic Note  John Ware is a 55 y.o. male admitted on 01/31/2021.  He has been on cefazolin for disseminated MSSA infection/bacteremia.  Pharmacy has been consulted for Cefepime dosing for new pseudomonas aeruginosa result from shoulder culture.     Plan: Continue cefepime 2g IV q8h F/U planned duration; per ID, TBD after last washout Will sign off and follow remotely. Thank you for allowing pharmacy to participate in this patient's care.    Height: 5\' 10"  (177.8 cm) Weight: 93 kg (205 lb 1.6 oz) IBW/kg (Calculated) : 73  Temp (24hrs), Avg:97.9 F (36.6 C), Min:97.6 F (36.4 C), Max:98.1 F (36.7 C)  Recent Labs  Lab 03/02/21 0359 03/03/21 0446 03/04/21 0305 03/05/21 0340 03/06/21 0402 03/07/21 0500  WBC 9.1 9.4 11.5* 10.4 9.8 7.1  CREATININE 0.49* 0.53* 0.45* 0.38* 0.40*  --      Estimated Creatinine Clearance: 122.3 mL/min (A) (by C-G formula based on SCr of 0.4 mg/dL (L)).    Allergies  Allergen Reactions   Tizanidine     Leg cramps    Antimicrobials this admission: 6/8 Vanc/ Cefepime/Flagyl x 1 6/8 CTX >>6/9 6/9 Ancef >> 7/10 7/10 Cefepime >>  8/9  03/07/2021 11:27 AM

## 2021-03-08 ENCOUNTER — Inpatient Hospital Stay (HOSPITAL_COMMUNITY): Payer: Commercial Managed Care - PPO | Admitting: Anesthesiology

## 2021-03-08 ENCOUNTER — Encounter (HOSPITAL_COMMUNITY): Payer: Self-pay | Admitting: Internal Medicine

## 2021-03-08 ENCOUNTER — Encounter (HOSPITAL_COMMUNITY)
Admission: EM | Disposition: A | Discharge status: Home Health | Payer: Self-pay | Source: Home / Self Care | Attending: Internal Medicine

## 2021-03-08 HISTORY — PX: IRRIGATION AND DEBRIDEMENT SHOULDER: SHX5880

## 2021-03-08 LAB — CBC WITH DIFFERENTIAL/PLATELET
Abs Immature Granulocytes: 0.05 10*3/uL (ref 0.00–0.07)
Basophils Absolute: 0 10*3/uL (ref 0.0–0.1)
Basophils Relative: 0 %
Eosinophils Absolute: 0.1 10*3/uL (ref 0.0–0.5)
Eosinophils Relative: 1 %
HCT: 24 % — ABNORMAL LOW (ref 39.0–52.0)
Hemoglobin: 7.5 g/dL — ABNORMAL LOW (ref 13.0–17.0)
Immature Granulocytes: 0 %
Lymphocytes Relative: 20 %
Lymphs Abs: 2.3 10*3/uL (ref 0.7–4.0)
MCH: 28.4 pg (ref 26.0–34.0)
MCHC: 31.3 g/dL (ref 30.0–36.0)
MCV: 90.9 fL (ref 80.0–100.0)
Monocytes Absolute: 0.8 10*3/uL (ref 0.1–1.0)
Monocytes Relative: 7 %
Neutro Abs: 8.2 10*3/uL — ABNORMAL HIGH (ref 1.7–7.7)
Neutrophils Relative %: 72 %
Platelets: 509 10*3/uL — ABNORMAL HIGH (ref 150–400)
RBC: 2.64 MIL/uL — ABNORMAL LOW (ref 4.22–5.81)
RDW: 15.5 % (ref 11.5–15.5)
WBC: 11.5 10*3/uL — ABNORMAL HIGH (ref 4.0–10.5)
nRBC: 0 % (ref 0.0–0.2)

## 2021-03-08 LAB — GLUCOSE, CAPILLARY
Glucose-Capillary: 88 mg/dL (ref 70–99)
Glucose-Capillary: 96 mg/dL (ref 70–99)
Glucose-Capillary: 97 mg/dL (ref 70–99)
Glucose-Capillary: 365 mg/dL — ABNORMAL HIGH (ref 70–99)

## 2021-03-08 SURGERY — IRRIGATION AND DEBRIDEMENT SHOULDER
Anesthesia: General | Site: Shoulder | Laterality: Left

## 2021-03-08 MED ORDER — SODIUM CHLORIDE 0.9 % IR SOLN
Status: DC | PRN
  Administered 2021-03-08 (×2): 3000 mL

## 2021-03-08 MED ORDER — ONDANSETRON HCL 4 MG/2ML IJ SOLN
INTRAMUSCULAR | Status: DC | PRN
  Administered 2021-03-08: 4 mg via INTRAVENOUS

## 2021-03-08 MED ORDER — PHENYLEPHRINE 40 MCG/ML (10ML) SYRINGE FOR IV PUSH (FOR BLOOD PRESSURE SUPPORT)
PREFILLED_SYRINGE | INTRAVENOUS | Status: AC
  Filled 2021-03-08: qty 20

## 2021-03-08 MED ORDER — HYDROMORPHONE HCL 1 MG/ML IJ SOLN
0.2500 mg | INTRAMUSCULAR | Status: DC | PRN
Start: 2021-03-08 — End: 2021-03-08
  Administered 2021-03-08 (×3): 0.5 mg via INTRAVENOUS
  Administered 2021-03-08: 0.25 mg via INTRAVENOUS

## 2021-03-08 MED ORDER — OXYCODONE HCL 5 MG/5ML PO SOLN: 5.0000 mg | Freq: Once | ORAL | Status: DC | PRN

## 2021-03-08 MED ORDER — FENTANYL CITRATE (PF) 100 MCG/2ML IJ SOLN
INTRAMUSCULAR | Status: AC
  Filled 2021-03-08: qty 2

## 2021-03-08 MED ORDER — LACTATED RINGERS IV SOLN: INTRAVENOUS | Status: DC

## 2021-03-08 MED ORDER — OXYCODONE HCL 5 MG PO TABS: 5.0000 mg | ORAL_TABLET | Freq: Once | ORAL | Status: DC | PRN

## 2021-03-08 MED ORDER — ROCURONIUM BROMIDE 10 MG/ML (PF) SYRINGE
PREFILLED_SYRINGE | INTRAVENOUS | Status: DC | PRN
  Administered 2021-03-08: 80 mg via INTRAVENOUS

## 2021-03-08 MED ORDER — OXYCODONE HCL 5 MG PO TABS
10.0000 mg | ORAL_TABLET | ORAL | Status: DC | PRN
  Administered 2021-03-08 – 2021-03-15 (×45): 10 mg via ORAL
  Filled 2021-03-08 (×46): qty 2

## 2021-03-08 MED ORDER — PHENYLEPHRINE 40 MCG/ML (10ML) SYRINGE FOR IV PUSH (FOR BLOOD PRESSURE SUPPORT)
PREFILLED_SYRINGE | INTRAVENOUS | Status: AC
  Filled 2021-03-08: qty 10

## 2021-03-08 MED ORDER — HYDROMORPHONE HCL 1 MG/ML IJ SOLN
INTRAMUSCULAR | Status: AC
  Administered 2021-03-08: 0.25 mg via INTRAVENOUS
  Filled 2021-03-08: qty 2

## 2021-03-08 MED ORDER — PHENYLEPHRINE 40 MCG/ML (10ML) SYRINGE FOR IV PUSH (FOR BLOOD PRESSURE SUPPORT)
PREFILLED_SYRINGE | INTRAVENOUS | Status: DC | PRN
  Administered 2021-03-08 (×6): 80 ug via INTRAVENOUS
  Administered 2021-03-08: 40 ug via INTRAVENOUS
  Administered 2021-03-08 (×2): 80 ug via INTRAVENOUS

## 2021-03-08 MED ORDER — FENTANYL CITRATE (PF) 100 MCG/2ML IJ SOLN
INTRAMUSCULAR | Status: DC | PRN
  Administered 2021-03-08 (×2): 50 ug via INTRAVENOUS

## 2021-03-08 MED ORDER — PROMETHAZINE HCL 25 MG/ML IJ SOLN: 6.2500 mg | INTRAMUSCULAR | Status: DC | PRN

## 2021-03-08 MED ORDER — PROPOFOL 10 MG/ML IV BOLUS
INTRAVENOUS | Status: DC | PRN
  Administered 2021-03-08: 100 mg via INTRAVENOUS
  Administered 2021-03-08: 30 mg via INTRAVENOUS

## 2021-03-08 MED ORDER — DEXAMETHASONE SODIUM PHOSPHATE 10 MG/ML IJ SOLN
INTRAMUSCULAR | Status: DC | PRN
  Administered 2021-03-08: 5 mg via INTRAVENOUS

## 2021-03-08 SURGICAL SUPPLY — 52 items
AID PSTN UNV HD RSTRNT DISP (MISCELLANEOUS) ×1
BOOTIES KNEE HIGH SLOAN (MISCELLANEOUS) ×3 IMPLANT
CANISTER WOUNDNEG PRESSURE 500 (CANNISTER) ×1 IMPLANT
COVER SURGICAL LIGHT HANDLE (MISCELLANEOUS) ×2 IMPLANT
DRAPE U-SHAPE 47X51 STRL (DRAPES) ×2 IMPLANT
DRESSING MEPILEX FLEX 4X4 (GAUZE/BANDAGES/DRESSINGS) IMPLANT
DRSG MEPILEX BORDER 4X8 (GAUZE/BANDAGES/DRESSINGS) ×2 IMPLANT
DRSG MEPILEX FLEX 4X4 (GAUZE/BANDAGES/DRESSINGS) ×2
DRSG PAD ABDOMINAL 8X10 ST (GAUZE/BANDAGES/DRESSINGS) ×2 IMPLANT
DRSG VAC ATS MED SENSATRAC (GAUZE/BANDAGES/DRESSINGS) ×1 IMPLANT
DURAPREP 26ML APPLICATOR (WOUND CARE) IMPLANT
ELECT REM PT RETURN 15FT ADLT (MISCELLANEOUS) ×2 IMPLANT
EVACUATOR 1/8 PVC DRAIN (DRAIN) IMPLANT
FACESHIELD WRAPAROUND (MASK) ×4 IMPLANT
FACESHIELD WRAPAROUND OR TEAM (MASK) IMPLANT
GAUZE SPONGE 4X4 12PLY STRL (GAUZE/BANDAGES/DRESSINGS) ×1 IMPLANT
GAUZE XEROFORM 1X8 LF (GAUZE/BANDAGES/DRESSINGS) ×1 IMPLANT
GLOVE SRG 8 PF TXTR STRL LF DI (GLOVE) ×1 IMPLANT
GLOVE SURG ENC MOIS LTX SZ7 (GLOVE) ×2 IMPLANT
GLOVE SURG ORTHO LTX SZ7.5 (GLOVE) ×2 IMPLANT
GLOVE SURG UNDER POLY LF SZ7 (GLOVE) ×2 IMPLANT
GLOVE SURG UNDER POLY LF SZ8 (GLOVE) ×2
GOWN STRL REUS W/ TWL XL LVL3 (GOWN DISPOSABLE) ×1 IMPLANT
GOWN STRL REUS W/TWL 2XL LVL3 (GOWN DISPOSABLE) ×2 IMPLANT
GOWN STRL REUS W/TWL XL LVL3 (GOWN DISPOSABLE) ×2
HANDPIECE INTERPULSE COAX TIP (DISPOSABLE)
KIT BASIN OR (CUSTOM PROCEDURE TRAY) ×2 IMPLANT
KIT TURNOVER KIT A (KITS) ×2 IMPLANT
MANIFOLD NEPTUNE II (INSTRUMENTS) ×2 IMPLANT
NS IRRIG 1000ML POUR BTL (IV SOLUTION) ×2 IMPLANT
PACK SHOULDER (CUSTOM PROCEDURE TRAY) ×2 IMPLANT
PAD ARMBOARD 7.5X6 YLW CONV (MISCELLANEOUS) ×4 IMPLANT
RESTRAINT HEAD UNIVERSAL NS (MISCELLANEOUS) ×2 IMPLANT
SET HNDPC FAN SPRY TIP SCT (DISPOSABLE) IMPLANT
SET IRRIG Y TYPE TUR BLADDER L (SET/KITS/TRAYS/PACK) ×1 IMPLANT
SPONGE T-LAP 18X18 ~~LOC~~+RFID (SPONGE) ×2 IMPLANT
SUPPORT WRAP ARM LG (MISCELLANEOUS) IMPLANT
SUT ETHILON 2 0 PSLX (SUTURE) ×2 IMPLANT
SUT ETHILON 3 0 PS 1 (SUTURE) IMPLANT
SUT VIC AB 0 CT1 27 (SUTURE)
SUT VIC AB 0 CT1 27XBRD ANBCTR (SUTURE) IMPLANT
SUT VIC AB 2-0 CT1 27 (SUTURE)
SUT VIC AB 2-0 CT1 TAPERPNT 27 (SUTURE) IMPLANT
SWAB COLLECTION DEVICE MRSA (MISCELLANEOUS) ×1 IMPLANT
SWAB CULTURE ESWAB REG 1ML (MISCELLANEOUS) IMPLANT
TOWEL OR 17X26 10 PK STRL BLUE (TOWEL DISPOSABLE) ×2 IMPLANT
TOWEL OR NON WOVEN STRL DISP B (DISPOSABLE) ×2 IMPLANT
TRAY PREP A LATEX SAFE STRL (SET/KITS/TRAYS/PACK) ×1 IMPLANT
TUBING CONNECTING 10 (TUBING) ×2 IMPLANT
UNDERPAD 30X36 HEAVY ABSORB (UNDERPADS AND DIAPERS) ×2 IMPLANT
WATER STERILE IRR 1000ML POUR (IV SOLUTION) ×1 IMPLANT
YANKAUER SUCT BULB TIP NO VENT (SUCTIONS) ×2 IMPLANT

## 2021-03-08 NOTE — Anesthesia Preprocedure Evaluation (Addendum)
Anesthesia Evaluation  Patient identified by MRN, date of birth, ID band Patient awake    Reviewed: Allergy & Precautions, NPO status , Patient's Chart, lab work & pertinent test results  Airway Mallampati: I       Dental  (+) Chipped, Poor Dentition, Dental Advisory Given,  Upper bridge, lower teeth extremely chipped:   Pulmonary former smoker,  Quit smoking 2012, 30 pack year history  COVID-positive at home 5/20     Pulmonary exam normal        Cardiovascular hypertension, Pt. on medications Normal cardiovascular exam  Echo 02/05/21:  1. Left ventricular ejection fraction, by estimation, is 60 to 65%. The  left ventricle has normal function. The left ventricle has no regional  wall motion abnormalities.  2. Right ventricular systolic function is normal. The right ventricular  size is normal.  3. No left atrial/left atrial appendage thrombus was detected.  4. The mitral valve is normal in structure. Trivial mitral valve  regurgitation. No evidence of mitral stenosis.  5. The tricuspid valve is abnormal. There is a large filamentous highly  mobile density predominantly on the ventricular side of the TV leaflets  consistent with vegetation with associated mild TR.  6. The aortic valve is normal in structure. Aortic valve regurgitation is  trivial. No aortic stenosis is present.  7. The inferior vena cava is normal in size with greater than 50%  respiratory variability, suggesting right atrial pressure of 3 mmHg.    Neuro/Psych PSYCHIATRIC DISORDERS Anxiety Depression negative neurological ROS     GI/Hepatic negative GI ROS, Neg liver ROS,   Endo/Other  diabetes, Poorly Controlled, Type 2, Oral Hypoglycemic Agents, Insulin Dependenta1c 10.8, initially presented in DKA- now resolved  Renal/GU negative Renal ROS  negative genitourinary   Musculoskeletal  (+) Arthritis , Osteoarthritis,  L shoulder septic arthritis   Spinal stenosis    Abdominal Normal abdominal exam  (+)   Peds  Hematology  (+) Blood dyscrasia, anemia , 7.5/24 this AM, Status post 2 units PRBC 6/30- has been stable >7 since then    Anesthesia Other Findings Severe sepsis/MSSA bacteremia/tricuspid endocarditis. Thoracic and lumbar osteomyelitis Bilateral psoas muscle abscess  Left shoulder debridement 6/16, right shoulder debridement 6/17, drain placement by IR 6/24.  Status post bilateral shoulder debridement with wound VAC placement 6/29  Reproductive/Obstetrics negative OB ROS                            Anesthesia Physical Anesthesia Plan  ASA: 3  Anesthesia Plan: General   Post-op Pain Management:    Induction: Intravenous  PONV Risk Score and Plan: 2 and Ondansetron, Midazolam and Treatment may vary due to age or medical condition  Airway Management Planned: Oral ETT  Additional Equipment: None  Intra-op Plan:   Post-operative Plan: Extubation in OR  Informed Consent: I have reviewed the patients History and Physical, chart, labs and discussed the procedure including the risks, benefits and alternatives for the proposed anesthesia with the patient or authorized representative who has indicated his/her understanding and acceptance.     Dental advisory given  Plan Discussed with: CRNA  Anesthesia Plan Comments:        Anesthesia Quick Evaluation

## 2021-03-08 NOTE — Transfer of Care (Signed)
Immediate Anesthesia Transfer of Care Note  Patient: John Ware  Procedure(s) Performed: Procedure(s): IRRIGATION AND DEBRIDEMENT SHOULDER (Left)  Patient Location: PACU  Anesthesia Type:General  Level of Consciousness: Alert, Awake, Oriented  Airway & Oxygen Therapy: Patient Spontanous Breathing  Post-op Assessment: Report given to RN  Post vital signs: Reviewed and stable  Last Vitals:  Vitals:   03/08/21 1143 03/08/21 1405  BP: 131/76 118/76  Pulse: 100 98  Resp: 16 18  Temp: 37.1 C 37.4 C  SpO2: 97% 95%    Complications: No apparent anesthesia complications

## 2021-03-08 NOTE — Anesthesia Procedure Notes (Signed)
Procedure Name: Intubation Date/Time: 03/08/2021 3:39 PM Performed by: Gerald Leitz, CRNA Pre-anesthesia Checklist: Patient identified, Patient being monitored, Timeout performed, Emergency Drugs available and Suction available Patient Re-evaluated:Patient Re-evaluated prior to induction Oxygen Delivery Method: Circle system utilized Preoxygenation: Pre-oxygenation with 100% oxygen Induction Type: IV induction Ventilation: Mask ventilation without difficulty Laryngoscope Size: Mac and 3 Grade View: Grade I Tube type: Oral Tube size: 7.5 mm Number of attempts: 1 Airway Equipment and Method: Stylet Placement Confirmation: ETT inserted through vocal cords under direct vision, positive ETCO2 and breath sounds checked- equal and bilateral Secured at: 23 cm Tube secured with: Tape Dental Injury: Teeth and Oropharynx as per pre-operative assessment

## 2021-03-08 NOTE — Progress Notes (Signed)
Pt cancel PT Cancellation Note  Patient Details Name: John Ware MRN: [id number] DOB: 1965/10/02   Cancelled Treatment:     pt scheduled for a repeat I&D today.  Will attempt to see another day.     Felecia Shelling  PTA Acute  Rehabilitation Services Pager      [phone] Office      [phone]

## 2021-03-08 NOTE — Progress Notes (Signed)
Patient seen and reexamined.  Still with drainage from the left wound VAC, minimal drainage from the right.  MRI demonstrates some evidence for reactive bone consistent with some early osteomyelitis.  Agree with expanding antibiotic coverage, plan for repeat I&D today.  We will continue to attempt to surgically debride this area, this certainly has been one of the most dramatic infections of my career, will reassess intraoperative findings to determine feasibility of closure versus repeat wound VAC.  I discussed all this with the patient.  [name], MD

## 2021-03-08 NOTE — Progress Notes (Signed)
PROGRESS NOTE    John Ware  ZOX:[id number] DOB: 01/12/1966 DOA: 01/31/2021 PCP: Junie Spencer, FNP   Brief Narrative:  55 year old with history of chronic pain, HTN, DM2, COVID-positive at home 5/20 reporting of body pain, shoulder pain eventually came to the ER.  Found to have MSSA bacteremia started on IV Ancef.  Large fluid collection on the left lateral posterior chest and back was noted.  Underwent MRI T-spine and CT CAP showing large multiloculated effusion  and osteomyelitis of thoracic spine.  There was concerns for septic emboli but echo was negative for TEE performed on 6/13 showed large tricuspid vegetation.  General surgery and orthopedic consulted.  Underwent left shoulder debridement 02/08/2021.  Postop had increasing size of his abscess collection therefore general surgery performed repeat debridement with wound VAC placement.  Dressing management per surgical team.  CT maxillofacial showed oral lucencies therefore oral surgery consulted.  Oral surgery did not think this was acute and Low suspicion for source per ID.  Underwent repeat I&D by Ortho on 02/28/2021.  Culture showing Pseudomonas.  Antibiotics switched to cefepime on 03/04/2020.Patient underwent another irrigation and debridement of the left shoulder on 03/06/2021.    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  Large left posterior wall and bilateral psoas muscle abscess, bilateral septic shoulder joint, osteomyelitis and  discitis.  Also possible floaters but endopthalmitis was ruled out. - Left shoulder debridement 6/16, right shoulder debridement 6/17, drain placement by IR 6/24.  Status post bilateral shoulder debridement with wound VAC placement 6/29.  Pathology for necrotic lymph node -benign adipose tissue - IV Ancef.  ID following.  No end date planned at this time. - PCA for pain control. Oxycodone 10mg  q4hrs prn,. -Repeat CT shoulder performed.  Status post repeat I&D 7/6, cultures growing Pseudomonas.  Antibiotics transitioned to cefepime on 03/07/2021.  Patient underwent another irrigation and debridement of the left shoulder on 03/06/2021.  Has wound VAC attached on the left shoulder along with Hemovacs bilaterally.  Underwent repeat MRI left shoulder 03/07/2021 which shows continued septic arthritis and acute osteomyelitis.  Full-thickness retracted tears of the supraspinatus and infraspinatus tendons.  Intramuscular edema was controlled within the supraspinatus, infraspinatus and anterior deltoid muscles.  Likely myositis.  He is going to OR today for another procedure today.  Essential hypertension: Takes amlodipine and lisinopril at home.  Both on hold.  Blood pressure controlled.  Poor dentition with dental lucencies per CT scan. Oral surgery, Dr 03/09/2021 didn't think there is anything acute. Low suspicious for potential source for ID.  Follow-up outpatient dentist  Anemia of chronic disease.   -Oral iron supplements with bowel regimen.  Status post 2 units PRBC 6/30.  Hemoglobin is stable over 7.  Closely monitor.   DKA.  Resolved.  Type 2 diabetes mellitus.   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   Blood sugar now better controlled.  Continue Lantus 25 units and SSI.  Ascending aorta dilatation (HCC) - TTE on 6/10 notes mild dilation of ascending aorta, 38 mm - TEE shows normal thoracic and ascending aorta noted on 02/05/21    Anxiety Continue Wellbutrin and continue to hold Zoloft.  Monitor sodium.   COVID-19 infection-resolved  Chronic hyponatremia: At his baseline.  DVT prophylaxis: Lovenox Code Status: Full Family Communication: Wife present at bedside, patient fully alert and oriented  Status is: Inpatient  Remains inpatient appropriate because:Inpatient level of care appropriate due to severity of illness.  Maintain hospital stay until cleared by orthopedic and general surgery.  Complex surgical case with multiple wounds requiring debridement  Dispo: The patient is from: Home              Anticipated d/c is to: SNF              Patient currently is not medically stable to d/c.   Difficult to place patient No     Body mass index is 29.43 kg/m.  Pressure Injury 01/31/21 Buttocks Right;Left;Posterior Stage 2 -  Partial thickness loss of dermis presenting as a shallow open injury with a red, pink wound bed without slough. (Active)  01/31/21 1546  Location: Buttocks  Location Orientation: Right;Left;Posterior  Staging: Stage 2 -  Partial thickness loss of dermis presenting as a shallow open injury with a red, pink wound bed without slough.  Wound Description (Comments):   Present on Admission: Yes      Subjective: Seen and examined.  He does not have any complaint.  Examination: General exam: Appears calm and comfortable  Respiratory system: Clear to auscultation. Respiratory effort normal. Cardiovascular system: S1 & S2 heard, RRR. No JVD, murmurs, rubs, gallops or clicks. No pedal edema. Gastrointestinal system: Abdomen is nondistended, soft and nontender. No organomegaly or masses felt. Normal bowel sounds heard. Central nervous system: Alert and oriented. No focal neurological deficits. Extremities: Wound VAC attached to the left shoulder and Hemovacs attached to both shoulders. Skin: No rashes, lesions or ulcers.  Psychiatry: Judgement and insight appear normal. Mood & affect  appropriate.    Objective: Vitals:   03/08/21 0405 03/08/21 0422 03/08/21 0937 03/08/21 1032  BP: 118/81     Pulse: 73     Resp: 18 12 12 16   Temp: 98.2 F (36.8 C)     TempSrc: Oral     SpO2: 100% 100% 100% 100%  Weight:      Height:        Intake/Output Summary (Last 24 hours) at 03/08/2021 1055 Last data filed at 03/08/2021 0850 Gross per 24 hour  Intake 982.99 ml  Output 2505 ml  Net -1522.01 ml    Filed Weights   02/08/21 1540 02/20/21 1419 02/27/21 1443  Weight: 97.3 kg 97.3 kg 93 kg     Data Reviewed:   CBC: Recent Labs  Lab 03/04/21 0305 03/05/21 0340 03/06/21 0402 03/07/21 0500 03/08/21 0305  WBC 11.5* 10.4 9.8 7.1 11.5*  NEUTROABS  --  7.4 6.8 5.7 8.2*  HGB 7.5* 7.5* 7.9* 7.8* 7.5*  HCT 24.5* 23.8* 25.1* 24.4* 24.0*  MCV 90.7 90.2 90.0 90.0 90.9  PLT 569* 536* 518* 480* 509*    Basic Metabolic Panel: Recent Labs  Lab 03/02/21 0359 03/03/21 0446 03/04/21 0305 03/05/21 0340 03/06/21 0402  NA 135 136 134* 137 135  K 3.8 3.9 3.8 3.8 4.0  CL 97* 98 97* 99 98  CO2 31 30 29 29 28   GLUCOSE 101* 97 116* 136* 144*  BUN 11 14  CREATININE 0.49* 0.53* 0.45* 0.38* 0.40*  CALCIUM 8.0* 8.3* 8.2* 8.4* 8.5*  MG 1.8 1.9 1.9  --   --     GFR: Estimated Creatinine Clearance: 122.3 mL/min (A) (by C-G formula based on SCr of 0.4 mg/dL (L)). Liver Function Tests: No results for input(s): AST, ALT, ALKPHOS, BILITOT, PROT, ALBUMIN in the last 168 hours. No results for input(s): LIPASE, AMYLASE in the last 168 hours. No results for input(s): AMMONIA in the last 168 hours. Coagulation Profile: No results for input(s): INR, PROTIME in the last 168 hours. Cardiac Enzymes: No results for input(s): CKTOTAL, CKMB, CKMBINDEX, TROPONINI in the last 168 hours. BNP (last 3 results) No results for input(s): PROBNP in the last 8760 hours. HbA1C: No results for input(s): HGBA1C in the last 72 hours. CBG: Recent Labs  Lab 03/07/21 0743 03/07/21 1220  03/07/21 1733 03/07/21 2109 03/08/21 0746  GLUCAP 222* 151* 143* 222* 88    Lipid Profile: No results for input(s): CHOL, HDL, LDLCALC, TRIG, CHOLHDL, LDLDIRECT in the last 72 hours. Thyroid Function Tests: No results for input(s): TSH, T4TOTAL, FREET4, T3FREE, THYROIDAB in the last 72 hours. Anemia Panel: No results for input(s): VITAMINB12, FOLATE, FERRITIN, TIBC, IRON, RETICCTPCT in the last 72 hours.  Sepsis Labs: No results for input(s): PROCALCITON, LATICACIDVEN in the last 168 hours.  Recent Results (from the past 240 hour(s))  Aerobic/Anaerobic Culture w Gram Stain (surgical/deep wound)     Status: None   Collection Time: 02/28/21  4:11 PM   Specimen: PATH Soft tissue  Result Value Ref Range Status   Specimen Description   Final    TISSUE LEFT SHOULDER A Performed at [facility], [address], [place], [place] [address]    Special Requests   Final    NONE Performed at [facility], [address], [place], [place] [address]    Gram Stain   Final    RARE WBC PRESENT, PREDOMINANTLY PMN NO ORGANISMS SEEN    Culture   Final    RARE PSEUDOMONAS AERUGINOSA NO ANAEROBES ISOLATED Performed at [facility], [address] [place], [place] [address]    Report Status 03/05/2021 FINAL  Final   Organism ID, Bacteria PSEUDOMONAS AERUGINOSA  Final      Susceptibility   Pseudomonas aeruginosa - MIC*    CEFTAZIDIME 4 SENSITIVE Sensitive     CIPROFLOXACIN <=0.25 SENSITIVE Sensitive     GENTAMICIN <=1 SENSITIVE Sensitive     IMIPENEM 1 SENSITIVE Sensitive     PIP/TAZO 8 SENSITIVE Sensitive     CEFEPIME 2 SENSITIVE Sensitive     * RARE PSEUDOMONAS AERUGINOSA  Aerobic/Anaerobic Culture w Gram Stain (surgical/deep wound)     Status: None   Collection Time: 02/28/21  4:12 PM   Specimen: PATH Soft tissue  Result Value Ref Range Status   Specimen Description   Final    TISSUE  RIGHT SHOULDER Performed at [facility], [address], [place], [place] [address]    Special Requests   Final    NONE Performed at [facility], [address] [place], [place] [address]    Gram Stain NO WBC SEEN NO ORGANISMS SEEN   Final   Culture   Final    No growth aerobically or anaerobically. Performed at [facility], [address] [place], [place] [address]    Report Status 03/05/2021 FINAL  Final  Surgical pcr screen  Status: None   Collection Time: 03/06/21  3:07 AM   Specimen: Nasal Mucosa; Nasal Swab  Result Value Ref Range Status   MRSA, PCR NEGATIVE NEGATIVE Final   Staphylococcus aureus NEGATIVE NEGATIVE Final    Comment: (NOTE) The Xpert SA Assay (FDA approved for NASAL specimens in patients 65 years of age and older), is one component of a comprehensive surveillance program. It is not intended to diagnose infection nor to guide or monitor treatment. Performed at [facility], [address], [place], [place] [address]           Radiology Studies: MR SHOULDER LEFT W WO CONTRAST  Result Date: 03/08/2021 CLINICAL DATA:  Left shoulder septic arthritis. Left shoulder incision and debridement 1 day ago EXAM: MRI OF THE LEFT SHOULDER WITHOUT AND WITH CONTRAST TECHNIQUE: Multiplanar, multisequence MR imaging of the left shoulder was performed before and after the administration of intravenous contrast. CONTRAST:  70mL GADAVIST GADOBUTROL 1 MMOL/ML IV SOLN COMPARISON:  CT 02/26/2021, MRI 02/06/2021 FINDINGS: Technical note: Despite efforts by the technologist and patient, motion artifact is present on today's exam and could not be eliminated. This reduces exam sensitivity and specificity. Bones/Joint/Cartilage Moderate sized glenohumeral joint effusion with enhancing synovitis. Surgical soft tissue defect overlying the anterior shoulder with direct communication to the subscapularis joint recess and glenohumeral joint space (series 12, images  12-18). New marrow edema and enhancement with loss of cortical definition and subcortical low T1 signal changes of the humeral head in the region of the lesser tuberosity suggests osteomyelitis (series 10, images 13-14). Patchy marrow edema within the greater tuberosity has slightly progressed compared to the prior MRI and may reflect reactive osteitis or early acute osteomyelitis (series 6, images 15-16). Small enhancing joint effusion of the acromioclavicular joint with subchondral marrow edema within the distal clavicle. Ligaments Grossly intact. Muscles and Tendons Intramuscular edema most pronounced within the supraspinatus, infraspinatus, and anterior deltoid muscles, likely reflecting myositis. Full-thickness retracted tears of the supraspinatus and infraspinatus tendons. Subscapularis tendinosis. Torn long head biceps tendon. Soft tissues Periarticular soft tissue edema. No organized fluid collections within the soft tissues. Large surgical defect at the anterior aspect of the shoulder. IMPRESSION: 1. Surgical soft tissue defect overlying the anterior shoulder with enhancing tract communicating with the subscapularis joint recess and glenohumeral joint space. Moderate sized glenohumeral joint effusion with enhancing synovitis suggesting septic arthritis. 2. New marrow signal changes of the humeral head in the region of the lesser tuberosity compatible with acute osteomyelitis. 3. Patchy marrow edema within the greater tuberosity has slightly progressed compared to the prior MRI and may reflect reactive osteitis or early acute osteomyelitis. 4. Full-thickness retracted tears of the supraspinatus and infraspinatus tendons. 5. Intramuscular edema most pronounced within the supraspinatus, infraspinatus, and anterior deltoid muscles, likely myositis. No organized fluid collections. These results will be called to the ordering clinician or representative by the Radiologist Assistant, and communication documented  in the PACS or [employer]. Electronically Signed   By: Duanne Guess D.O.   On: 03/08/2021 08:14        Scheduled Meds:  (feeding supplement) PROSource Plus  30 mL Oral TID BM   sodium chloride   Intravenous Once   acetaminophen  500 mg Oral QID   vitamin C  500 mg Oral BID   buPROPion  150 mg Oral BID   Chlorhexidine Gluconate Cloth  6 each Topical Daily   enoxaparin (LOVENOX) injection  40 mg Subcutaneous Q24H   feeding supplement  237  mL Oral Q24H   ferrous sulfate  325 mg Oral BID WC   gabapentin  200 mg Oral BID   HYDROmorphone   Intravenous Q4H   insulin aspart  0-15 Units Subcutaneous TID WC   insulin aspart  0-5 Units Subcutaneous QHS   insulin glargine  25 Units Subcutaneous Daily   mouth rinse  15 mL Mouth Rinse BID   nutrition supplement (JUVEN)  1 packet Oral BID BM   polyethylene glycol  17 g Oral Daily   povidone-iodine  2 application Topical Once   senna-docusate  1 tablet Oral BID   sodium chloride flush  5 mL Intracatheter Q8H   zinc sulfate  220 mg Oral Daily   Continuous Infusions:  0.45 % NaCl with KCl 20 mEq / L Stopped (03/07/21 1218)   sodium chloride 250 mL (03/06/21 0417)   ceFEPime (MAXIPIME) IV 2 g (03/08/21 0614)     LOS: 36 days   Time spent= 27 mins  Hughie Closs, MD [facility]  If 7PM-7AM, please contact night-coverage  03/08/2021, 10:55 AM

## 2021-03-08 NOTE — Plan of Care (Signed)
  Problem: Education: Goal: Knowledge of General Education information will improve Description: Including pain rating scale, medication(s)/side effects and non-pharmacologic comfort measures Outcome: Progressing   Problem: Clinical Measurements: Goal: Diagnostic test results will improve Outcome: Progressing   Problem: Nutrition: Goal: Adequate nutrition will be maintained Outcome: Progressing   Problem: Pain Managment: Goal: General experience of comfort will improve Outcome: Progressing   Problem: Safety: Goal: Ability to remain free from injury will improve Outcome: Progressing   Problem: Activity: Goal: Risk for activity intolerance will decrease Outcome: Adequate for Discharge

## 2021-03-08 NOTE — Op Note (Signed)
01/31/2021 - 03/08/2021  4:43 PM  PATIENT:  John Ware    PRE-OPERATIVE DIAGNOSIS: Left shoulder glenohumeral septic arthritis with osteomyelitis of the proximal humerus and deep shoulder abscess  POST-OPERATIVE DIAGNOSIS:  Same  PROCEDURE: Left shoulder irrigation and debridement, excisional debridement, deep shoulder abscess, glenohumeral joint, with venting of the proximal humerus for the treatment of osteomyelitis  SURGEON:  Eulas Post, MD  PHYSICIAN ASSISTANT: Janine Ores, PA-C, present and scrubbed throughout the case, critical for completion in a timely fashion, and for retraction, instrumentation, and closure.  ANESTHESIA:   General  PREOPERATIVE INDICATIONS:  John Ware is a  55 y.o. male who has had a persistent shoulder infection, with systemic infection, and elected for revision excisional debridement.  The risks benefits and alternatives were discussed with the patient preoperatively including but not limited to the risks of infection, bleeding, nerve injury, cardiopulmonary complications, the need for revision surgery, among others, and the patient was willing to proceed.  ESTIMATED BLOOD LOSS: 100 mL  OPERATIVE IMPLANTS: Medium wound VAC  OPERATIVE FINDINGS: Mild amount of purulence in the anterior deltoid and pectoral interval, with some softening of the proximal humeral bone at the superior aspect of the subscapularis attachment, with a little bit of serous fluid still coming from the joint  OPERATIVE PROCEDURE: The patient was brought to the operating room and placed in supine position.  General anesthesia was administered.  The right shoulder Hemovac was removed after removing the suture, it had been in place for almost a week and had minimal output.  He was positioned in the beachchair position and the left upper extremity prepped and draped in usual sterile fashion.  Timeout was performed.  I explored his wound, and there was a mild amount of serous  drainage still coming from the glenohumeral joint and anterior aspect of the deltopectoral interval.  This tracked a little bit around laterally, although did not seem that bad.  I assessed his acromioclavicular joint, I did not expose it but did explore the soft tissue tracts, and it did not seem to be tracking into the [facility] joint.  Additionally, the supraclavicular area seemed pretty dry.  Therefore I used a rongeur, as well as a Cobb as a curette, debrided the soft tissue, expose the joint, and irrigated copiously with 6 L of fluid.  I opened the proximal humerus slightly with the rongeur at the top of the subscapularis where it felt a little soft, and the MRI had demonstrated some possible early osteo.   I closed the superior aspect of the long anterior incision, leaving the distal half of the incision open and placed a wound VAC.  The plan will be to go back to surgery on Mondays and if she continues to improve then we may be able to close his wound or at least transition to wet-to-dry dressings.  I offered that to the patient today, and said that he felt that the wet-to-dry dressings were excruciatingly painful and did not want to have that done.  Debridement type: Excisional Debridement  Side: left  Body Location: shoulder   Tools used for debridement: curette and rongeur  Pre-debridement Wound size (cm):   Length: 30        Width: 5     Depth: 5   Post-debridement Wound size (cm):   Length: 15        Width: 5     Depth: 5   Debridement depth beyond dead/damaged tissue down to healthy viable tissue:  yes  Tissue layer involved: skin, subcutaneous tissue, muscle / fascia, bone  Nature of tissue removed: Slough, Necrotic, Devitalized Tissue, Non-viable tissue, and Purulence  Irrigation volume: 6L     Irrigation fluid type: Normal Saline

## 2021-03-08 NOTE — Anesthesia Postprocedure Evaluation (Signed)
Anesthesia Post Note  Patient: John Ware  Procedure(s) Performed: IRRIGATION AND DEBRIDEMENT SHOULDER (Left: Shoulder)     Patient location during evaluation: PACU Anesthesia Type: General Level of consciousness: awake and alert, oriented and patient cooperative Pain management: pain level controlled Vital Signs Assessment: post-procedure vital signs reviewed and stable Respiratory status: spontaneous breathing, nonlabored ventilation and respiratory function stable Cardiovascular status: blood pressure returned to baseline and stable Postop Assessment: no apparent nausea or vomiting Anesthetic complications: no   No notable events documented.  Last Vitals:  Vitals:   03/08/21 1715 03/08/21 1730  BP: 111/64 113/74  Pulse: 84 77  Resp: 18 16  Temp:    SpO2: 95% 94%    Last Pain:  Vitals:   03/08/21 1720  TempSrc:   PainSc: 5                  Lannie Fields

## 2021-03-09 ENCOUNTER — Encounter (HOSPITAL_COMMUNITY): Payer: Self-pay | Admitting: Orthopedic Surgery

## 2021-03-09 LAB — CBC WITH DIFFERENTIAL/PLATELET
Abs Immature Granulocytes: 0.06 10*3/uL (ref 0.00–0.07)
Basophils Absolute: 0 10*3/uL (ref 0.0–0.1)
Basophils Relative: 0 %
Eosinophils Absolute: 0 10*3/uL (ref 0.0–0.5)
Eosinophils Relative: 0 %
HCT: 23.6 % — ABNORMAL LOW (ref 39.0–52.0)
Hemoglobin: 7.4 g/dL — ABNORMAL LOW (ref 13.0–17.0)
Immature Granulocytes: 1 %
Lymphocytes Relative: 12 %
Lymphs Abs: 0.9 10*3/uL (ref 0.7–4.0)
MCH: 28.6 pg (ref 26.0–34.0)
MCHC: 31.4 g/dL (ref 30.0–36.0)
MCV: 91.1 fL (ref 80.0–100.0)
Monocytes Absolute: 0.4 10*3/uL (ref 0.1–1.0)
Monocytes Relative: 5 %
Neutro Abs: 6 10*3/uL (ref 1.7–7.7)
Neutrophils Relative %: 82 %
Platelets: 482 10*3/uL — ABNORMAL HIGH (ref 150–400)
RBC: 2.59 MIL/uL — ABNORMAL LOW (ref 4.22–5.81)
RDW: 15.4 % (ref 11.5–15.5)
WBC: 7.3 10*3/uL (ref 4.0–10.5)
nRBC: 0 % (ref 0.0–0.2)

## 2021-03-09 LAB — GLUCOSE, CAPILLARY
Glucose-Capillary: 285 mg/dL — ABNORMAL HIGH (ref 70–99)
Glucose-Capillary: 92 mg/dL (ref 70–99)
Glucose-Capillary: 221 mg/dL — ABNORMAL HIGH (ref 70–99)
Glucose-Capillary: 142 mg/dL — ABNORMAL HIGH (ref 70–99)

## 2021-03-09 LAB — FUNGAL ORGANISM REFLEX

## 2021-03-09 LAB — BASIC METABOLIC PANEL
Anion gap: 8 (ref 5–15)
BUN: 18 mg/dL (ref 6–20)
CO2: 29 mmol/L (ref 22–32)
Calcium: 8.3 mg/dL — ABNORMAL LOW (ref 8.9–10.3)
Chloride: 95 mmol/L — ABNORMAL LOW (ref 98–111)
Creatinine, Ser: 0.46 mg/dL — ABNORMAL LOW (ref 0.61–1.24)
GFR, Estimated: >60 mL/min (ref >60)
Glucose, Bld: 327 mg/dL — ABNORMAL HIGH (ref 70–99)
Potassium: 4.3 mmol/L (ref 3.5–5.1)
Sodium: 132 mmol/L — ABNORMAL LOW (ref 135–145)

## 2021-03-09 LAB — FUNGUS CULTURE WITH STAIN

## 2021-03-09 LAB — VITAMIN D 25 HYDROXY (VIT D DEFICIENCY, FRACTURES): Vit D, 25-Hydroxy: 32.94 ng/mL (ref 30–100)

## 2021-03-09 LAB — FUNGUS CULTURE RESULT

## 2021-03-09 MED ORDER — CHLORHEXIDINE GLUCONATE 4 % EX LIQD
60.0000 mL | Freq: Once | CUTANEOUS | Status: DC
  Filled 2021-03-09: qty 60

## 2021-03-09 MED ORDER — POVIDONE-IODINE 10 % EX SWAB
2.0000 "application " | Freq: Once | CUTANEOUS | Status: AC
  Administered 2021-03-12: 2 via TOPICAL

## 2021-03-09 MED ORDER — ENSURE ENLIVE PO LIQD
237.0000 mL | Freq: Two times a day (BID) | ORAL | Status: DC
  Administered 2021-03-09 – 2021-03-15 (×8): 237 mL via ORAL

## 2021-03-09 MED ORDER — PROSOURCE PLUS PO LIQD
30.0000 mL | Freq: Four times a day (QID) | ORAL | Status: DC
  Administered 2021-03-09 – 2021-03-15 (×19): 30 mL via ORAL
  Filled 2021-03-09 (×18): qty 30

## 2021-03-09 NOTE — Plan of Care (Signed)
  Problem: Education: Goal: Knowledge of General Education information will improve Description: Including pain rating scale, medication(s)/side effects and non-pharmacologic comfort measures Outcome: Progressing   Problem: Clinical Measurements: Goal: Will remain free from infection Outcome: Progressing Goal: Diagnostic test results will improve Outcome: Progressing   Problem: Activity: Goal: Risk for activity intolerance will decrease Outcome: Progressing   Problem: Coping: Goal: Level of anxiety will decrease Outcome: Progressing   

## 2021-03-09 NOTE — Progress Notes (Signed)
PHARMACY CONSULT NOTE FOR:  OUTPATIENT  PARENTERAL ANTIBIOTIC THERAPY (OPAT)  Indication: MSSA/pseudomonas shoulder infection Regimen: cefepime 2 g IV Q8H End date: 04/05/21  IV antibiotic discharge orders are pended. To discharging provider:  please sign these orders via discharge navigator,  Select New Orders & click on the button choice - Manage This Unsigned Work.     Thank you for allowing pharmacy to be a part of this patient's care.  Andree Coss, PharmD Candidate 03/09/2021, 2:24 PM

## 2021-03-09 NOTE — Progress Notes (Signed)
Mobility Specialist - Progress Note    03/09/21 1600  Mobility  Activity Ambulated in hall  Level of Assistance Contact guard assist, steadying assist  Assistive Device Other (Comment) (IV Pole)  Distance Ambulated (ft) 500 ft  Mobility Ambulated with assistance in hallway  Mobility Response Tolerated well  Mobility performed by Mobility specialist  $Mobility charge 1 Mobility    Pt ambulated 500 ft in hallway. Pt c/o of lower back pain during ambulation and stated feeling "a little dizzy". Pt returned to recliner after ambulation awaiting pain medication and was left with call bell at side. RN informed of pain.   Arliss Journey Mobility Specialist Acute Rehabilitation Services Office: [phone] 03/09/21, 4:17 PM

## 2021-03-09 NOTE — Progress Notes (Signed)
Occupational Therapy Treatment Patient Details Name: John Ware MRN: [id number] DOB: April 19, 1966 Today's Date: 03/09/2021    History of present illness 55 year old with history of chronic pain, HTN, DM2, COVID-positive at home 5/20 reporting of body pain, shoulder pain eventually came to the ER.  Found to have MSSA bacteremia started on IV Ancef.  Large fluid collection on the left lateral posterior chest and back was noted.  Underwent MRI T-spine and CT CAP showing large multiloculated effusion  and osteomyelitis of thoracic spine.  There was concerns for septic emboli but echo was negative for TEE performed on 6/13 showed large tricuspid vegetation.  General surgery and orthopedic consulted.  Underwent left shoulder debridement 02/08/2021.  Postop had increasing size of his abscess collection therefore general surgery performed repeat debridement with wound VAC placement.  Dressing management per surgical team.  CT maxillofacial showed oral lucencies therefore oral surgery consulted.  Oral surgery did not think this was acute and Low suspicion for source per ID.  Underwent repeat I&D by Ortho on 02/28/2021. patient had repeat I &D on left shoulder 7/12 and 7/14 with another I & D scheduled for 03/12/21.   OT comments  Patient was seated on edge of bed in room at start of session. Patient participated in [place] of BUE to maintain ROM of bilateral shoulders. Patient participated in sitting balance challenges with reaching of RUE on all levels to challenge AROM of RUE. Patient completed AROM of bilateral elbows, wrists and hands during session. Patient was educated on importance of use BUE during all functional tasks as able to maintain ROM. Patient verbalized understanding. Patient reported having surgery scheduled for 7/18. Will see as able and continue to follow patient while in acute care setting. D/c recommendations remain appropriate at this time.   Follow Up Recommendations  Home health OT     Equipment Recommendations  3 in 1 bedside commode;Tub/shower seat    Recommendations for Other Services      Precautions / Restrictions Precautions Precautions: Fall Precaution Comments: pt has been WBAT B UE's with slings for "comfort" but has not needed them.  New Ortho PN from 7/6 states B UE's NWB       Mobility Bed Mobility                    Transfers                      Balance   Sitting-balance support: No upper extremity supported Sitting balance-[name]: Good Sitting balance - Comments: sitting on edge of bed   Standing balance support: During functional activity Standing balance-[name]: Fair                             ADL either performed or assessed with clinical judgement   ADL                                               Vision       Perception     Praxis      Cognition Arousal/Alertness: Awake/alert Behavior During Therapy: WFL for tasks assessed/performed Overall Cognitive Status: Within Functional Limits for tasks assessed Area of Impairment: Problem solving;Attention  Current Attention Level: Sustained                    Exercises Other Exercises Other Exercises: R shoulder AROM external rotation x 10 reps. patient participated in AROM of RUE to maitnain ROM of shoulder, elbow, wrist and hand. Other Exercises: patient participated in AAROM of L shoulder with increased pain noted with patient only able to tolerate up to 20 degrees of flexion. patient was able to complete elbow flexion and forarm supination/pronation.   Shoulder Instructions       General Comments      Pertinent Vitals/ Pain       Pain Score: 4  (at start of session. progressed to 6/10 with movement of RUE) Pain Location: R shoulder Pain Descriptors / Indicators: Grimacing;Aching Pain Intervention(s): Limited activity within patient's tolerance  Home Living                                           Prior Functioning/Environment              Frequency  Min 2X/week        Progress Toward Goals  OT Goals(current goals can now be found in the care plan section)  Progress towards OT goals: Progressing toward goals  Acute Rehab OT Goals Patient Stated Goal: to go home at discharge OT Goal Formulation: With patient Time For Goal Achievement: 03/12/21 Potential to Achieve Goals: Good  Plan Discharge plan remains appropriate    Co-evaluation                 AM-PAC OT "6 Clicks" Daily Activity     Outcome Measure   Help from another person eating meals?: A Little Help from another person taking care of personal grooming?: A Little Help from another person toileting, which includes using toliet, bedpan, or urinal?: A Lot Help from another person bathing (including washing, rinsing, drying)?: A Lot Help from another person to put on and taking off regular upper body clothing?: A Lot Help from another person to put on and taking off regular lower body clothing?: A Lot 6 Click Score: 14    End of Session    OT Visit Diagnosis: Unsteadiness on feet (R26.81);Other abnormalities of gait and mobility (R26.89);History of falling (Z91.81);Muscle weakness (generalized) (M62.81);Pain Pain - Right/Left: Left Pain - part of body: Shoulder   Activity Tolerance Patient tolerated treatment well   Patient Left in bed;with call bell/phone within reach   Nurse Communication Other (comment) (nurse cleared patient to participate)        Time: 9767-3419 OT Time Calculation (min): 25 min  Charges: OT General Charges $OT Visit: 1 Visit OT Treatments $Therapeutic Activity: 23-37 mins  Sharyn Blitz OTR/L, MS Acute Rehabilitation Department Office# [phone] Pager# [phone]    [facility] Guest Sukhdeep Wieting 03/09/2021, 11:29 AM

## 2021-03-09 NOTE — Progress Notes (Addendum)
Subjective: 1 Day Post-Op s/p Procedure(s): IRRIGATION AND DEBRIDEMENT SHOULDER   Patient is alert, oriented. Reports pain at bilateral shoulder, especially right is significantly improved since most recent wash out and removal of hemovacs. Pain is moderate at left shoulder this morning. Denies nausea, vomiting, lightheadedness, dizziness, decreased sensation.  Objective:  PE: VITALS:   Vitals:   03/08/21 2314 03/09/21 0254 03/09/21 0433 03/09/21 0608  BP:  102/69  112/73  Pulse:  66  62  Resp: 10  11 18   Temp:  97.6 F (36.4 C)  (!) 97.5 F (36.4 C)  TempSrc:  Oral  Oral  SpO2: 98% 99% 99% 99%  Weight:      Height:       0-70 deg FF at right shoulder. 5 deg FF left shoulder. Axillary nerve sensation and deltoid firing intact bilaterally. Full ROM of bilateral elbows, wrist, and all fingers of hands. R shoulder incision healing well. L wound vac in place with good seal, aprox 30 cc's of bloody drainage in canister. Distal sensation and capillary refill intact. 5/5 grip strength bilaterally.   LABS  Results for orders placed or performed during the hospital encounter of 01/31/21 (from the past 24 hour(s))  Glucose, capillary     Status: None   Collection Time: 03/08/21  7:46 AM  Result Value Ref Range   Glucose-Capillary 88 70 - 99 mg/dL  Glucose, capillary     Status: None   Collection Time: 03/08/21 11:38 AM  Result Value Ref Range   Glucose-Capillary 96 70 - 99 mg/dL  Glucose, capillary     Status: None   Collection Time: 03/08/21  5:07 PM  Result Value Ref Range   Glucose-Capillary 97 70 - 99 mg/dL  Glucose, capillary     Status: Abnormal   Collection Time: 03/08/21 10:27 PM  Result Value Ref Range   Glucose-Capillary 365 (H) 70 - 99 mg/dL  CBC with Differential/Platelet     Status: Abnormal   Collection Time: 03/09/21  3:59 AM  Result Value Ref Range   WBC 7.3 4.0 - 10.5 K/uL   RBC 2.59 (L) 4.22 - 5.81 MIL/uL   Hemoglobin 7.4 (L) 13.0 - 17.0 g/dL   HCT  03/11/21 (L) 37.6 - 52.0 %   MCV 91.1 80.0 - 100.0 fL   MCH 28.6 26.0 - 34.0 pg   MCHC 31.4 30.0 - 36.0 g/dL   RDW 28.3 15.1 - 76.1 %   Platelets 482 (H) 150 - 400 K/uL   nRBC 0.0 0.0 - 0.2 %   Neutrophils Relative % 82 %   Neutro Abs 6.0 1.7 - 7.7 K/uL   Lymphocytes Relative 12 %   Lymphs Abs 0.9 0.7 - 4.0 K/uL   Monocytes Relative 5 %   Monocytes Absolute 0.4 0.1 - 1.0 K/uL   Eosinophils Relative 0 %   Eosinophils Absolute 0.0 0.0 - 0.5 K/uL   Basophils Relative 0 %   Basophils Absolute 0.0 0.0 - 0.1 K/uL   Immature Granulocytes 1 %   Abs Immature Granulocytes 0.06 0.00 - 0.07 K/uL  Basic metabolic panel     Status: Abnormal   Collection Time: 03/09/21  3:59 AM  Result Value Ref Range   Sodium 132 (L) 135 - 145 mmol/L   Potassium 4.3 3.5 - 5.1 mmol/L   Chloride 95 (L) 98 - 111 mmol/L   CO2 29 22 - 32 mmol/L   Glucose, Bld 327 (H) 70 - 99 mg/dL   BUN 18  6 - 20 mg/dL   Creatinine, Ser 4.70 (L) 0.61 - 1.24 mg/dL   Calcium 8.3 (L) 8.9 - 10.3 mg/dL   GFR, Estimated >96 >28 mL/min   Anion gap 8 5 - 15    MR SHOULDER LEFT W WO CONTRAST  Result Date: 03/08/2021 CLINICAL DATA:  Left shoulder septic arthritis. Left shoulder incision and debridement 1 day ago EXAM: MRI OF THE LEFT SHOULDER WITHOUT AND WITH CONTRAST TECHNIQUE: Multiplanar, multisequence MR imaging of the left shoulder was performed before and after the administration of intravenous contrast. CONTRAST:  36mL GADAVIST GADOBUTROL 1 MMOL/ML IV SOLN COMPARISON:  CT 02/26/2021, MRI 02/06/2021 FINDINGS: Technical note: Despite efforts by the technologist and patient, motion artifact is present on today's exam and could not be eliminated. This reduces exam sensitivity and specificity. Bones/Joint/Cartilage Moderate sized glenohumeral joint effusion with enhancing synovitis. Surgical soft tissue defect overlying the anterior shoulder with direct communication to the subscapularis joint recess and glenohumeral joint space (series 12,  images 12-18). New marrow edema and enhancement with loss of cortical definition and subcortical low T1 signal changes of the humeral head in the region of the lesser tuberosity suggests osteomyelitis (series 10, images 13-14). Patchy marrow edema within the greater tuberosity has slightly progressed compared to the prior MRI and may reflect reactive osteitis or early acute osteomyelitis (series 6, images 15-16). Small enhancing joint effusion of the acromioclavicular joint with subchondral marrow edema within the distal clavicle. Ligaments Grossly intact. Muscles and Tendons Intramuscular edema most pronounced within the supraspinatus, infraspinatus, and anterior deltoid muscles, likely reflecting myositis. Full-thickness retracted tears of the supraspinatus and infraspinatus tendons. Subscapularis tendinosis. Torn long head biceps tendon. Soft tissues Periarticular soft tissue edema. No organized fluid collections within the soft tissues. Large surgical defect at the anterior aspect of the shoulder. IMPRESSION: 1. Surgical soft tissue defect overlying the anterior shoulder with enhancing tract communicating with the subscapularis joint recess and glenohumeral joint space. Moderate sized glenohumeral joint effusion with enhancing synovitis suggesting septic arthritis. 2. New marrow signal changes of the humeral head in the region of the lesser tuberosity compatible with acute osteomyelitis. 3. Patchy marrow edema within the greater tuberosity has slightly progressed compared to the prior MRI and may reflect reactive osteitis or early acute osteomyelitis. 4. Full-thickness retracted tears of the supraspinatus and infraspinatus tendons. 5. Intramuscular edema most pronounced within the supraspinatus, infraspinatus, and anterior deltoid muscles, likely myositis. No organized fluid collections. These results will be called to the ordering clinician or representative by the Radiologist Assistant, and communication  documented in the PACS or [employer]. Electronically Signed   By: Duanne Guess D.O.   On: 03/08/2021 08:14    Assessment/Plan:  Bilateral shoulder septic arthritis - L shoulder irrigation and debridement 6/16 - R shoulder irrigation and debridement with distal clavicle resection 6/17 - bilateral shoulder irrigation and debridement with wound vac placement bilaterally 6/29 - bilateral shoulder irrigation and debridement with closure and incisional wound vac placement, hemovac placement R shoulder, L wound vac and hemovac placement 7/7 - L shoulder irrigation and debridement with hemovac placement and wound vac placement 7/12 - L shoulder irrigation and debridement with wound vac placement, removed hemovac of bilateral shoulders 7/14  - MRI of L shoulder demonstrates some evidence for reactive bone consistent with some early osteomyelitis.  Agree with expanding antibiotic coverage, IV antibiotics per ID, pseudomonas seen on most recent shoulder culture, no new leukocytosis - daily dressing changes of right shoulder dressings, can use mepilex or just  bulky dressing of 4x4's, abds, and tape - continuous wound vac of left shoulder, please let ortho on call provider know if wound vac looses seal at any point over the weekend. This will need to be replaced with new plastic dressing and tubing if so, patient refuses sponge changes.  - plan for repeat I&D Monday with Dr. Dion Saucier, ideally with closure if clinical improvement at that time  Contact information:   Weekdays 8-5 [address] [place], [place] [phone] After hours and holidays please check [email or web address] for group call information for [facility]  Armida Sans 03/09/2021, 7:27 AM

## 2021-03-09 NOTE — Progress Notes (Signed)
Nutrition Follow-up  DOCUMENTATION CODES:   Not applicable  INTERVENTION:  - continue 1 packet Juven BID. - increase Ensure Enlive from once/day to BID.  - increase Prosource from TID to QID.  - continue to encourage PO intakes.  - weigh patient today.  - recommend check a serum vitamin A and serum vitamin D.    NUTRITION DIAGNOSIS:   Increased nutrient needs related to acute illness, wound healing as evidenced by estimated needs. -ongoing  GOAL:   Patient will meet greater than or equal to 90% of their needs -minimally met on average   MONITOR:   PO intake, Supplement acceptance, Labs, Weight trends, Skin, I & O's  ASSESSMENT:   55 year old male with medical history of DM, HTN, depression, and chronic back pain on chronic narcotics 2/2 spinal stenosis. He presented to the ED d/t generalized malaise x3 weeks. He tested positive for COVID at home on 01/12/21 and presented to the ED on 5/28 after falling out of a chair and having subsequent shoulder pain. Imaging was negative so he was given a sling for comfort. He returned to the ED on 6/8 d/t inability to walk. 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 CT chest showed large L lateral chest injury  Recently documented meal intakes: 7/8- 100% of breakfast and dinner (total of 1101 kcal and 48 grams protein 7/9- 100% of breakfast, 0% of lunch, and 100% of dinner (total of 1243 kcal and 64 grams protein) 7/10- 75% of breakfast and 100% of lunch (total of 760 kcal and 58 grams protein) 7/11- 100% of breakfast (544 kcal and 37 grams protein) 7/13- L shoulder I&D 7/14- L shoulder I&D, excisional debridement, deep shoulder abscess, glenohumeral joint with venting of proximal humerus for treatment of osteomyelitis; wound vacs removed from bilateral shoulders   Recently documented meal intakes: 7/10- 100% of breakfast and 100% of lunch (total of 851 kcal and 67 grams protein) 7/11- 100% of  breakfast and dinner (total of 899 kcal and 56 grams protein) 7/12- 100% of dinner (411 kcal and 27 grams protein) 7/13- 100% of breakfast and 100% of lunch (total of 721 kcal and 61 grams protein)    He has been accepting Prosource ~75% of the time offered and Ensure and Juven nearly 100% of the time offered. If he consumes 100% of supplement each time accepted, he is consuming ~765 kcal and 59 grams protein/day from oral nutrition supplements.  He has not been weighed since 7/5.non-pitting edema to BUE and mild pitting edema to BLE documented in the edema section of flow sheet.   Per notes: - plan for repeat I&D on 7/18 and hopeful for closure of wound on that date   Labs reviewed; CBG: 285 mg/dl, Na: 132 mmol/l, Cl: 95 mmol/l, creatinine: 0.46 mg/dl, Ca: 8.3 mg/dl.  Medications reviewed; 500 mg ascorbic acid BID, 325 mg ferrous sulfate BID, sliding scale novolog, 25 units lantus/day, 17 g miralax/day, 1 tablet senokot BID, 220 mg zinc sulfate/day.  IVF; 1/2 NS-20 mEq KCl @ 50 ml/hr.   Diet Order:   Diet Order             Diet Carb Modified Fluid consistency: Thin; Room service appropriate? Yes  Diet effective now                   EDUCATION NEEDS:   Education needs have been addressed  Skin:  Skin Assessment: Skin Integrity Issues: Skin Integrity Issues:: Incisions, Wound VAC,  Stage II Stage II: bilateral buttocks Wound Vac: bilateral shoulders Incisions: L chest (6/18, 6/28); R shoulder (6/16, 6/18, 6/28); L shoulder (6/28); back (6/28)  Last BM:  7/8  Height:   Ht Readings from Last 1 Encounters:  02/20/21 5' 10" (1.778 m)    Weight:   Wt Readings from Last 1 Encounters:  02/27/21 93 kg      Estimated Nutritional Needs:  Kcal:  2500-2700 kcal Protein:  150-170 grams Fluid:  >/= 2.5 L/day     Jarome Matin, MS, RD, LDN, CNSC Inpatient Clinical Dietitian RD pager # available in AMION  After hours/weekend pager # available in [facility]

## 2021-03-09 NOTE — Progress Notes (Signed)
[facility]    Date of Admission:  01/31/2021      ID: John Ware is a 55 y.o. male with disseminated MSSA infection and most recent secondary pseudomonal left shoulder septic arthritis Active Problems:   Right knee pain   Diabetes mellitus (HCC)   Hyponatremia   Pressure injury of skin   HTN (hypertension)   Anxiety   Left shoulder pain   Ascending aorta dilatation (HCC)   Vision changes   Endocarditis   Right shoulder pain   Chest wall abscess   Obesity   Normocytic anemia   Thrombocytosis   Psoas muscle abscess (HCC)    Subjective: Afebrile, left pain associated with shoulder surgery from yesterday.  Medications:   (feeding supplement) PROSource Plus  30 mL Oral QID   acetaminophen  500 mg Oral QID   vitamin C  500 mg Oral BID   buPROPion  150 mg Oral BID   Chlorhexidine Gluconate Cloth  6 each Topical Daily   enoxaparin (LOVENOX) injection  40 mg Subcutaneous Q24H   feeding supplement  237 mL Oral BID BM   ferrous sulfate  325 mg Oral BID WC   gabapentin  200 mg Oral BID   HYDROmorphone   Intravenous Q4H   insulin aspart  0-15 Units Subcutaneous TID WC   insulin aspart  0-5 Units Subcutaneous QHS   insulin glargine  25 Units Subcutaneous Daily   mouth rinse  15 mL Mouth Rinse BID   nutrition supplement (JUVEN)  1 packet Oral BID BM   polyethylene glycol  17 g Oral Daily   senna-docusate  1 tablet Oral BID   sodium chloride flush  5 mL Intracatheter Q8H   zinc sulfate  220 mg Oral Daily    Objective: Vital signs in last 24 hours: Temp:  [97.5 F (36.4 C)-98.4 F (36.9 C)] 98 F (36.7 C) (07/15 1437) Pulse Rate:  [62-86] 82 (07/15 1437) Resp:  [10-18] 16 (07/15 1437) BP: (102-119)/(64-80) 106/71 (07/15 1437) SpO2:  [94 %-100 %] 100 % (07/15 1437) FiO2 (%):  [0 %] 0 % (07/15 1203)  Physical Exam  Constitutional: He is oriented to person, place, and time. He appears well-developed and well-nourished. No distress.   HENT:  Mouth/Throat: Oropharynx is clear and moist. No oropharyngeal exudate.  Cardiovascular: Normal rate, regular rhythm and normal heart sounds. Exam reveals no gallop and no friction rub.  No murmur heard.  Chest wall: left chest wall incision good granulation tissue Ext: wound vac on left shoulder, bandaged incision on right shoulder Pulmonary/Chest: Effort normal and breath sounds normal. No respiratory distress. He has no wheezes.  Abdominal: Soft. Bowel sounds are normal. He exhibits no distension. There is no tenderness.  Lymphadenopathy:  He has no cervical adenopathy.  Neurological: He is alert and oriented to person, place, and time.  Skin: Skin is warm and dry. No rash noted. No erythema.  Psychiatric: He has a normal mood and affect. His behavior is normal.    Lab Results Recent Labs    03/08/21 0305 03/09/21 0359  WBC 11.5* 7.3  HGB 7.5* 7.4*  HCT 24.0* 23.6*  NA  --  132*  K  --  4.3  CL  --  95*  CO2  --  29  BUN  --  18  CREATININE  --  0.46*   Lab Results  Component Value Date   ESRSEDRATE 41 (H) 02/07/2021     Microbiology: 7/6 right shoulder  NGTD 7/6 left shoulder PsA -Pansensitive 6/23 left subscapular abscess= abundant MSSA 6/23 left shoulder =MSSA 6/22 blood cx NGTD 6/18 left chest wall = MSSA 6/8 blood cx = MSSA Studies/Results: MR SHOULDER LEFT W WO CONTRAST  Result Date: 03/08/2021 CLINICAL DATA:  Left shoulder septic arthritis. Left shoulder incision and debridement 1 day ago EXAM: MRI OF THE LEFT SHOULDER WITHOUT AND WITH CONTRAST TECHNIQUE: Multiplanar, multisequence MR imaging of the left shoulder was performed before and after the administration of intravenous contrast. CONTRAST:  27mL GADAVIST GADOBUTROL 1 MMOL/ML IV SOLN COMPARISON:  CT 02/26/2021, MRI 02/06/2021 FINDINGS: Technical note: Despite efforts by the technologist and patient, motion artifact is present on today's exam and could not be eliminated. This reduces exam  sensitivity and specificity. Bones/Joint/Cartilage Moderate sized glenohumeral joint effusion with enhancing synovitis. Surgical soft tissue defect overlying the anterior shoulder with direct communication to the subscapularis joint recess and glenohumeral joint space (series 12, images 12-18). New marrow edema and enhancement with loss of cortical definition and subcortical low T1 signal changes of the humeral head in the region of the lesser tuberosity suggests osteomyelitis (series 10, images 13-14). Patchy marrow edema within the greater tuberosity has slightly progressed compared to the prior MRI and may reflect reactive osteitis or early acute osteomyelitis (series 6, images 15-16). Small enhancing joint effusion of the acromioclavicular joint with subchondral marrow edema within the distal clavicle. Ligaments Grossly intact. Muscles and Tendons Intramuscular edema most pronounced within the supraspinatus, infraspinatus, and anterior deltoid muscles, likely reflecting myositis. Full-thickness retracted tears of the supraspinatus and infraspinatus tendons. Subscapularis tendinosis. Torn long head biceps tendon. Soft tissues Periarticular soft tissue edema. No organized fluid collections within the soft tissues. Large surgical defect at the anterior aspect of the shoulder. IMPRESSION: 1. Surgical soft tissue defect overlying the anterior shoulder with enhancing tract communicating with the subscapularis joint recess and glenohumeral joint space. Moderate sized glenohumeral joint effusion with enhancing synovitis suggesting septic arthritis. 2. New marrow signal changes of the humeral head in the region of the lesser tuberosity compatible with acute osteomyelitis. 3. Patchy marrow edema within the greater tuberosity has slightly progressed compared to the prior MRI and may reflect reactive osteitis or early acute osteomyelitis. 4. Full-thickness retracted tears of the supraspinatus and infraspinatus tendons. 5.  Intramuscular edema most pronounced within the supraspinatus, infraspinatus, and anterior deltoid muscles, likely myositis. No organized fluid collections. These results will be called to the ordering clinician or representative by the Radiologist Assistant, and communication documented in the PACS or [employer]. Electronically Signed   By: Duanne Guess D.O.   On: 03/08/2021 08:14     Assessment/Plan: Left shoulder PsA and MSSA septic arthritis = plan to treat with cefepime for the next 4 wk using 7/14 as day 1. Will need picc line and removal of left IJ  MSSA disseminated infection (endocarditis, septic pulm emboli, septic arthritis, thoracic-lumbar discitis) = continue on cefepime for 4 wk then we will see back in the ID clinic for conversion to oral abtx suppression given burden of disease  Poor dentition = not thought to be cause of MSSA infection, recommend outpatient follow up  Will sign off.  Call if questions.  [facility] for Infectious Diseases Cell: [phone] Pager: [phone]  03/09/2021, 4:27 PM

## 2021-03-09 NOTE — Progress Notes (Signed)
PROGRESS NOTE    John Ware  WUJ:[id number] DOB: 1966-01-12 DOA: 01/31/2021 PCP: Junie Spencer, FNP   Brief Narrative:  55 year old with history of chronic pain, HTN, DM2, COVID-positive at home 5/20 reporting of body pain, shoulder pain eventually came to the ER.  Found to have MSSA bacteremia started on IV Ancef.  Large fluid collection on the left lateral posterior chest and back was noted.  Underwent MRI T-spine and CT CAP showing large multiloculated effusion  and osteomyelitis of thoracic spine.  There was concerns for septic emboli but echo was negative for TEE performed on 6/13 showed large tricuspid vegetation.  General surgery and orthopedic consulted.  Underwent left shoulder debridement 02/08/2021.  Postop had increasing size of his abscess collection therefore general surgery performed repeat debridement with wound VAC placement.  Dressing management per surgical team.  CT maxillofacial showed oral lucencies therefore oral surgery consulted.  Oral surgery did not think this was acute and Low suspicion for source per ID.  Underwent repeat I&D by Ortho on 02/28/2021.  Culture showing Pseudomonas.  Antibiotics switched to cefepime on 03/04/2020.Patient underwent another irrigation and debridement of the left shoulder on 03/06/2021 followed by another left shoulder irrigation and debridement with wound VAC placement on 03/08/2021.   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  Large left posterior  wall and bilateral psoas muscle abscess, bilateral septic shoulder joint, osteomyelitis and discitis.  Also possible floaters but endopthalmitis was ruled out. - Left shoulder debridement 6/16, right shoulder debridement 6/17, drain placement by IR 6/24.  Status post bilateral shoulder debridement with wound VAC placement 6/29.  Pathology for necrotic lymph node -benign adipose tissue - IV Ancef.  ID following.  No end date planned at this time. - PCA for pain control. Oxycodone  q4hrs prn,. -Repeat CT shoulder performed.  Status post repeat I&D 7/6, cultures growing Pseudomonas.  Antibiotics transitioned to cefepime on 03/07/2021.  Patient underwent another irrigation and debridement of the left shoulder on 03/06/2021.  Had wound VAC on the left shoulder and Hemovac on bilateral shoulders.  Underwent repeat MRI left shoulder 03/07/2021 which shows continued septic arthritis and acute osteomyelitis.  Full-thickness retracted tears of the supraspinatus and infraspinatus tendons.  Intramuscular edema was controlled within the supraspinatus, infraspinatus and anterior deltoid muscles.  Likely myositis.  He is going to OR today for another procedure today.  He underwent another left shoulder irrigation and debridement with wound VAC placement on 03/08/2021 however both Hemovacs and bilateral shoulders were removed.  He is scheduled for another procedure on Monday, 03/12/2021.  Essential hypertension: Takes amlodipine and lisinopril at home.  Both on hold.  Blood pressure controlled.  Poor dentition with dental lucencies per CT scan. Oral surgery, Dr Ross Marcus didn't think there is anything acute. Low suspicious for potential source for ID.  Follow-up outpatient dentist  Anemia of chronic disease.   -Oral iron supplements with bowel regimen.  Status post 2 units PRBC 6/30.  Hemoglobin is  stable over 7.  Closely monitor.   DKA.  Resolved.  Type 2 diabetes mellitus.   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   Blood sugar very labile, was within normal range yesterday and now over 300.  We will continue Lantus 25 units and SSI.  Reassess tomorrow.   Ascending aorta dilatation (HCC) - TTE on 6/10 notes mild dilation of ascending aorta, 38 mm - TEE shows normal thoracic and ascending aorta noted on 02/05/21   Anxiety Continue Wellbutrin and continue to hold Zoloft.  Monitor sodium.   COVID-19 infection-resolved  Chronic hyponatremia: At his baseline.  DVT prophylaxis: Lovenox Code Status: Full Family Communication: None at bedside.  Status is: Inpatient  Remains inpatient appropriate because:Inpatient level of care appropriate due to severity of illness.  Maintain hospital stay until cleared by orthopedic and general surgery.  Complex surgical case with multiple wounds requiring debridement  Dispo: The patient is from: Home              Anticipated d/c is to: SNF              Patient currently is not medically stable to d/c.   Difficult to place patient No     Body mass index is 29.43 kg/m.  Pressure Injury 01/31/21 Buttocks Right;Left;Posterior Stage 2 -  Partial thickness loss of dermis presenting as a shallow open injury with a red, pink wound bed without slough. (Active)  01/31/21 1546  Location: Buttocks  Location Orientation: Right;Left;Posterior  Staging: Stage 2 -  Partial thickness loss of dermis presenting as a shallow open injury with a red, pink wound bed without slough.  Wound Description (Comments):   Present on Admission: Yes      Subjective: Seen and examined.  He has no new complaints.  Examination: General exam: Appears calm and comfortable  Respiratory system: Clear to auscultation. Respiratory effort normal. Cardiovascular system: S1 & S2 heard, RRR. No JVD, murmurs, rubs, gallops or clicks.  Trace pitting edema bilateral lower extremity. Gastrointestinal system: Abdomen is nondistended,  soft and nontender. No organomegaly or masses felt. Normal bowel sounds heard. Central nervous system: Alert and oriented. No focal neurological deficits. Extremities: Decreased power in left upper extremity due to shoulder issues.  Has wound VAC attached to the left shoulder. Skin: No rashes, lesions or ulcers.  Psychiatry: Judgement and insight appear normal. Mood & affect appropriate.     Objective: Vitals:   03/09/21 0433 03/09/21 0608 03/09/21 0920 03/09/21 1019  BP:  112/73  117/80  Pulse:  62  76  Resp: 11 18 16 16   Temp:  (!) 97.5 F (36.4 C)  98.1 F (36.7 C)  TempSrc:  Oral  Oral  SpO2: 99% 99% 99% 100%  Weight:      Height:        Intake/Output Summary (Last 24 hours) at 03/09/2021 1134 Last data filed at 03/09/2021 0900 Gross per 24 hour  Intake 1060 ml  Output 3440 ml  Net -2380 ml    Filed Weights   02/08/21 1540 02/20/21 1419 02/27/21 1443  Weight: 97.3 kg 97.3 kg 93 kg     Data Reviewed:   CBC: Recent Labs  Lab 03/05/21 0340 03/06/21 0402 03/07/21 0500 03/08/21 0305 03/09/21 0359  WBC 10.4 9.8 7.1 11.5* 7.3  NEUTROABS 7.4 6.8 5.7 8.2* 6.0  HGB 7.5* 7.9* 7.8* 7.5* 7.4*  HCT 23.8* 25.1* 24.4* 24.0* 23.6*  MCV 90.2 90.0 90.0 90.9 91.1  PLT 536* 518* 480* 509* 482*    Basic Metabolic Panel: Recent Labs  Lab 03/03/21 0446 03/04/21 0305 03/05/21 0340 03/06/21 0402 03/09/21 0359  NA 136 134* 137 135 132*  K 3.9 3.8 3.8 4.0 4.3  CL 98 97* 99 98 95*  CO2 30 29 29 28 29   GLUCOSE 97 116* 136* 144* 327*  BUN 14 14 13 14 18   CREATININE 0.53* 0.45* 0.38* 0.40* 0.46*  CALCIUM 8.3* 8.2* 8.4* 8.5* 8.3*  MG 1.9 1.9  --   --   --     GFR: Estimated Creatinine Clearance: 122.3 mL/min (A) (by C-G formula based on SCr of 0.46 mg/dL (L)). Liver Function Tests: No results for input(s): AST, ALT, ALKPHOS, BILITOT, PROT, ALBUMIN in the last 168 hours. No results for input(s): LIPASE, AMYLASE in the last 168 hours. No results for input(s): AMMONIA in  the last 168 hours. Coagulation Profile: No results for input(s): INR, PROTIME in the last 168 hours. Cardiac Enzymes: No results for input(s): CKTOTAL, CKMB, CKMBINDEX, TROPONINI in the last 168 hours. BNP (last 3 results) No results for input(s): PROBNP in the last 8760 hours. HbA1C: No results for input(s): HGBA1C in the last 72 hours. CBG: Recent Labs  Lab 03/08/21 0746 03/08/21 1138 03/08/21 1707 03/08/21 2227 03/09/21 0729  GLUCAP 88 96 97 365* 285*    Lipid Profile: No results for input(s): CHOL, HDL, LDLCALC, TRIG, CHOLHDL, LDLDIRECT in the last 72 hours. Thyroid Function Tests: No results for input(s): TSH, T4TOTAL, FREET4, T3FREE, THYROIDAB in the last 72 hours. Anemia Panel: No results for input(s): VITAMINB12, FOLATE, FERRITIN, TIBC, IRON, RETICCTPCT in the last 72 hours.  Sepsis Labs: No results for input(s): PROCALCITON, LATICACIDVEN in the last 168 hours.  Recent Results (from the past 240 hour(s))  Aerobic/Anaerobic Culture w Gram Stain (surgical/deep wound)     Status: None   Collection Time: 02/28/21  4:11 PM   Specimen: PATH Soft tissue  Result Value Ref Range Status   Specimen Description   Final    TISSUE LEFT SHOULDER A Performed at [facility], [address] [place], [place] [address]    Special Requests   Final    NONE Performed at [facility], [address] [place], [place] [address]    Gram Stain   Final    RARE WBC PRESENT, PREDOMINANTLY PMN NO ORGANISMS SEEN    Culture   Final    RARE PSEUDOMONAS AERUGINOSA NO ANAEROBES ISOLATED Performed at [facility], [address] [place], [place] [address]    Report Status 03/05/2021 FINAL  Final   Organism ID, Bacteria PSEUDOMONAS AERUGINOSA  Final      Susceptibility   Pseudomonas aeruginosa - MIC*    CEFTAZIDIME 4 SENSITIVE Sensitive     CIPROFLOXACIN <=0.25 SENSITIVE Sensitive     GENTAMICIN <=1 SENSITIVE Sensitive     IMIPENEM 1  SENSITIVE Sensitive     PIP/TAZO 8 SENSITIVE Sensitive     CEFEPIME 2 SENSITIVE Sensitive     * RARE PSEUDOMONAS AERUGINOSA  Aerobic/Anaerobic Culture w Gram Stain (surgical/deep wound)     Status: None   Collection Time: 02/28/21  4:12 PM   Specimen: PATH Soft tissue  Result Value Ref Range Status   Specimen Description   Final    TISSUE  RIGHT SHOULDER Performed at [facility], [address] [place], [place] [address]    Special Requests   Final    NONE Performed  at [facility], [address], [place], [place] [address]    Gram Stain NO WBC SEEN NO ORGANISMS SEEN   Final   Culture   Final    No growth aerobically or anaerobically. Performed at [facility], [address] [place], [place] [address]    Report Status 03/05/2021 FINAL  Final  Surgical pcr screen     Status: None   Collection Time: 03/06/21  3:07 AM   Specimen: Nasal Mucosa; Nasal Swab  Result Value Ref Range Status   MRSA, PCR NEGATIVE NEGATIVE Final   Staphylococcus aureus NEGATIVE NEGATIVE Final    Comment: (NOTE) The Xpert SA Assay (FDA approved for NASAL specimens in patients 59 years of age and older), is one component of a comprehensive surveillance program. It is not intended to diagnose infection nor to guide or monitor treatment. Performed at [facility], [address] [place], [place] [address]           Radiology Studies: MR SHOULDER LEFT W WO CONTRAST  Result Date: 03/08/2021 CLINICAL DATA:  Left shoulder septic arthritis. Left shoulder incision and debridement 1 day ago EXAM: MRI OF THE LEFT SHOULDER WITHOUT AND WITH CONTRAST TECHNIQUE: Multiplanar, multisequence MR imaging of the left shoulder was performed before and after the administration of intravenous contrast. CONTRAST:  22mL GADAVIST GADOBUTROL 1 MMOL/ML IV SOLN COMPARISON:  CT 02/26/2021, MRI 02/06/2021 FINDINGS: Technical note: Despite efforts by the  technologist and patient, motion artifact is present on today's exam and could not be eliminated. This reduces exam sensitivity and specificity. Bones/Joint/Cartilage Moderate sized glenohumeral joint effusion with enhancing synovitis. Surgical soft tissue defect overlying the anterior shoulder with direct communication to the subscapularis joint recess and glenohumeral joint space (series 12, images 12-18). New marrow edema and enhancement with loss of cortical definition and subcortical low T1 signal changes of the humeral head in the region of the lesser tuberosity suggests osteomyelitis (series 10, images 13-14). Patchy marrow edema within the greater tuberosity has slightly progressed compared to the prior MRI and may reflect reactive osteitis or early acute osteomyelitis (series 6, images 15-16). Small enhancing joint effusion of the acromioclavicular joint with subchondral marrow edema within the distal clavicle. Ligaments Grossly intact. Muscles and Tendons Intramuscular edema most pronounced within the supraspinatus, infraspinatus, and anterior deltoid muscles, likely reflecting myositis. Full-thickness retracted tears of the supraspinatus and infraspinatus tendons. Subscapularis tendinosis. Torn long head biceps tendon. Soft tissues Periarticular soft tissue edema. No organized fluid collections within the soft tissues. Large surgical defect at the anterior aspect of the shoulder. IMPRESSION: 1. Surgical soft tissue defect overlying the anterior shoulder with enhancing tract communicating with the subscapularis joint recess and glenohumeral joint space. Moderate sized glenohumeral joint effusion with enhancing synovitis suggesting septic arthritis. 2. New marrow signal changes of the humeral head in the region of the lesser tuberosity compatible with acute osteomyelitis. 3. Patchy marrow edema within the greater tuberosity has slightly progressed compared to the prior MRI and may reflect reactive osteitis  or early acute osteomyelitis. 4. Full-thickness retracted tears of the supraspinatus and infraspinatus tendons. 5. Intramuscular edema most pronounced within the supraspinatus, infraspinatus, and anterior deltoid muscles, likely myositis. No organized fluid collections. These results will be called to the ordering clinician or representative by the Radiologist Assistant, and communication documented in the PACS or [employer]. Electronically Signed   By: Duanne Guess D.O.   On: 03/08/2021 08:14        Scheduled Meds:  (  feeding supplement) PROSource Plus  30 mL Oral TID BM   acetaminophen  500 mg Oral QID   vitamin C  500 mg Oral BID   buPROPion  150 mg Oral BID   Chlorhexidine Gluconate Cloth  6 each Topical Daily   enoxaparin (LOVENOX) injection  40 mg Subcutaneous Q24H   feeding supplement  237 mL Oral BID BM   ferrous sulfate  325 mg Oral BID WC   gabapentin  200 mg Oral BID   HYDROmorphone   Intravenous Q4H   insulin aspart  0-15 Units Subcutaneous TID WC   insulin aspart  0-5 Units Subcutaneous QHS   insulin glargine  25 Units Subcutaneous Daily   mouth rinse  15 mL Mouth Rinse BID   nutrition supplement (JUVEN)  1 packet Oral BID BM   polyethylene glycol  17 g Oral Daily   senna-docusate  1 tablet Oral BID   sodium chloride flush  5 mL Intracatheter Q8H   zinc sulfate  220 mg Oral Daily   Continuous Infusions:  0.45 % NaCl with KCl 20 mEq / L Stopped (03/07/21 1218)   sodium chloride 250 mL (03/06/21 0417)   ceFEPime (MAXIPIME) IV 2 g (03/09/21 0502)     LOS: 37 days   Time spent= 28 mins  Hughie Closs, MD [facility]  If 7PM-7AM, please contact night-coverage  03/09/2021, 11:34 AM

## 2021-03-10 LAB — CBC WITH DIFFERENTIAL/PLATELET
Abs Immature Granulocytes: 0.04 10*3/uL (ref 0.00–0.07)
Basophils Absolute: 0 10*3/uL (ref 0.0–0.1)
Basophils Relative: 0 %
Eosinophils Absolute: 0.1 10*3/uL (ref 0.0–0.5)
Eosinophils Relative: 1 %
HCT: 23.9 % — ABNORMAL LOW (ref 39.0–52.0)
Hemoglobin: 7.4 g/dL — ABNORMAL LOW (ref 13.0–17.0)
Immature Granulocytes: 0 %
Lymphocytes Relative: 25 %
Lymphs Abs: 2.7 10*3/uL (ref 0.7–4.0)
MCH: 28.6 pg (ref 26.0–34.0)
MCHC: 31 g/dL (ref 30.0–36.0)
MCV: 92.3 fL (ref 80.0–100.0)
Monocytes Absolute: 0.8 10*3/uL (ref 0.1–1.0)
Monocytes Relative: 7 %
Neutro Abs: 7.1 10*3/uL (ref 1.7–7.7)
Neutrophils Relative %: 67 %
Platelets: 503 10*3/uL — ABNORMAL HIGH (ref 150–400)
RBC: 2.59 MIL/uL — ABNORMAL LOW (ref 4.22–5.81)
RDW: 15.6 % — ABNORMAL HIGH (ref 11.5–15.5)
WBC: 10.7 10*3/uL — ABNORMAL HIGH (ref 4.0–10.5)
nRBC: 0 % (ref 0.0–0.2)

## 2021-03-10 LAB — GLUCOSE, CAPILLARY
Glucose-Capillary: 109 mg/dL — ABNORMAL HIGH (ref 70–99)
Glucose-Capillary: 146 mg/dL — ABNORMAL HIGH (ref 70–99)
Glucose-Capillary: 87 mg/dL (ref 70–99)
Glucose-Capillary: 156 mg/dL — ABNORMAL HIGH (ref 70–99)

## 2021-03-10 NOTE — Progress Notes (Signed)
PROGRESS NOTE    John Ware  AYT:[id number] DOB: 1965/10/15 DOA: 01/31/2021 PCP: Junie Spencer, FNP   Brief Narrative:  55 year old with history of chronic pain, HTN, DM2, COVID-positive at home 5/20 reporting of body pain, shoulder pain eventually came to the ER.  Found to have MSSA bacteremia started on IV Ancef.  Large fluid collection on the left lateral posterior chest and back was noted.  Underwent MRI T-spine and CT CAP showing large multiloculated effusion  and osteomyelitis of thoracic spine.  There was concerns for septic emboli but echo was negative for TEE performed on 6/13 showed large tricuspid vegetation.  General surgery and orthopedic consulted.  Underwent left shoulder debridement 02/08/2021.  Postop had increasing size of his abscess collection therefore general surgery performed repeat debridement with wound VAC placement.  Dressing management per surgical team.  CT maxillofacial showed oral lucencies therefore oral surgery consulted.  Oral surgery did not think this was acute and Low suspicion for source per ID.  Underwent repeat I&D by Ortho on 02/28/2021.  Culture showing Pseudomonas.  Antibiotics switched to cefepime on 03/04/2020.Patient underwent another irrigation and debridement of the left shoulder on 03/06/2021 followed by another left shoulder irrigation and debridement with wound VAC placement on 03/08/2021.   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  Large left posterior  wall and bilateral psoas muscle abscess, bilateral septic shoulder joint, osteomyelitis and discitis.  Also possible floaters but endopthalmitis was ruled out. - Left shoulder debridement 6/16, right shoulder debridement 6/17, drain placement by IR 6/24.  Status post bilateral shoulder debridement with wound VAC placement 6/29.  Pathology for necrotic lymph node -benign adipose tissue - IV Ancef.  ID following.  No end date planned at this time. - PCA for pain control. Oxycodone 10mg  q4hrs prn,. -Repeat CT shoulder performed.  Status post repeat I&D 7/6, cultures growing Pseudomonas.  Antibiotics transitioned to cefepime on 03/07/2021.  Patient underwent another irrigation and debridement of the left shoulder on 03/06/2021.  Had wound VAC on the left shoulder and Hemovac on bilateral shoulders.  Underwent repeat MRI left shoulder 03/07/2021 which shows continued septic arthritis and acute osteomyelitis.  Full-thickness retracted tears of the supraspinatus and infraspinatus tendons.  Intramuscular edema was controlled within the supraspinatus, infraspinatus and anterior deltoid muscles.  Likely myositis.  He is going to OR today for another procedure today.  He underwent another left shoulder irrigation and debridement with wound VAC placement on 03/08/2021 however both Hemovacs and bilateral shoulders were removed.  He is scheduled for another procedure on Monday, 03/12/2021.  ID recommends continuing cefepime for total of 4 more weeks starting 714.  Will need PICC line and removal of left IJ after last procedure.  Essential hypertension: Takes amlodipine and lisinopril at home.  Both on hold.  Blood pressure controlled.  Poor dentition with dental lucencies per CT scan. Oral surgery, Dr 03/14/2021 didn't think there is anything acute. Low suspicious for potential source for ID.  Follow-up outpatient dentist  Anemia of chronic disease.   -Oral iron supplements with bowel regimen.  Status post 2 units PRBC 6/30.   Hemoglobin is stable over 7.  Closely monitor.   DKA.  Resolved.  Type 2 diabetes mellitus.   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   Blood sugar remains labile but fairly controlled now.  We will continue Lantus 25 units and SSI.    Ascending aorta dilatation (HCC) - TTE on 6/10 notes mild dilation of ascending aorta, 38 mm - TEE shows normal thoracic and ascending aorta noted on 02/05/21   Anxiety Continue Wellbutrin and continue to hold Zoloft.  Monitor sodium.   COVID-19 infection-resolved  Chronic hyponatremia: At his baseline.  DVT prophylaxis: Lovenox Code Status: Full Family Communication: None at bedside.  Status is: Inpatient  Remains inpatient appropriate because:Inpatient level of care appropriate due to severity of illness.  Maintain hospital stay until cleared by orthopedic and general surgery.  Complex surgical case with multiple wounds requiring debridement  Dispo: The patient is from: Home              Anticipated d/c is to: SNF              Patient currently is not medically stable to d/c.   Difficult to place patient No     Body mass index is 29.43 kg/m.  Pressure Injury 01/31/21 Buttocks Right;Left;Posterior Stage 2 -  Partial thickness loss of dermis presenting as a shallow open injury with a red, pink wound bed without slough. (Active)  01/31/21 1546  Location: Buttocks  Location Orientation: Right;Left;Posterior  Staging: Stage 2 -  Partial thickness loss of dermis presenting as a shallow open injury with a red, pink wound bed without slough.  Wound Description (Comments):   Present on Admission: Yes      Subjective: Seen and examined.  No new complaint.  Minimal pain in the left shoulder.  He is on PCA pump.  Examination: General exam: Appears calm and comfortable  Respiratory system: Clear to auscultation. Respiratory effort normal. Cardiovascular system: S1 & S2 heard, RRR. No  JVD, murmurs, rubs, gallops or clicks. No pedal edema. Gastrointestinal system: Abdomen is nondistended, soft and nontender. No organomegaly or masses felt. Normal bowel sounds heard. Central nervous system: Alert and oriented. No focal neurological deficits. Extremities: Restricted movement in right shoulder and much more on the left shoulder.  Has wound VAC on the left shoulder. Skin: No rashes, lesions or ulcers.  Psychiatry: Judgement and insight appear normal. Mood & affect appropriate.     Objective: Vitals:   03/10/21 0000 03/10/21 0427 03/10/21 0450 03/10/21 0823  BP:   123/84   Pulse:   75   Resp: 16 12 16 12   Temp:   98.1 F (36.7 C)   TempSrc:   Oral   SpO2: 98% 100% 98% 98%  Weight:      Height:        Intake/Output Summary (Last 24 hours) at 03/10/2021 0925 Last data filed at 03/10/2021 0917 Gross per 24 hour  Intake --  Output 2375 ml  Net -2375 ml    Filed Weights   02/08/21 1540 02/20/21 1419 02/27/21 1443  Weight: 97.3 kg 97.3 kg 93 kg     Data Reviewed:   CBC: Recent Labs  Lab 03/06/21 0402 03/07/21 0500 03/08/21 0305 03/09/21 0359 03/10/21 0328  WBC 9.8 7.1 11.5* 7.3 10.7*  NEUTROABS 6.8 5.7 8.2* 6.0  7.1  HGB 7.9* 7.8* 7.5* 7.4* 7.4*  HCT 25.1* 24.4* 24.0* 23.6* 23.9*  MCV 90.0 90.0 90.9 91.1 92.3  PLT 518* 480* 509* 482* 503*    Basic Metabolic Panel: Recent Labs  Lab 03/04/21 0305 03/05/21 0340 03/06/21 0402 03/09/21 0359  NA 134* 137 135 132*  K 3.8 3.8 4.0 4.3  CL 97* 99 98 95*  CO2 29 29 28 29   GLUCOSE 116* 136* 144* 327*  BUN 14 13 14 18   CREATININE 0.45* 0.38* 0.40* 0.46*  CALCIUM 8.2* 8.4* 8.5* 8.3*  MG 1.9  --   --   --     GFR: Estimated Creatinine Clearance: 122.3 mL/min (A) (by C-G formula based on SCr of 0.46 mg/dL (L)). Liver Function Tests: No results for input(s): AST, ALT, ALKPHOS, BILITOT, PROT, ALBUMIN in the last 168 hours. No results for input(s): LIPASE, AMYLASE in the last 168 hours. No results for  input(s): AMMONIA in the last 168 hours. Coagulation Profile: No results for input(s): INR, PROTIME in the last 168 hours. Cardiac Enzymes: No results for input(s): CKTOTAL, CKMB, CKMBINDEX, TROPONINI in the last 168 hours. BNP (last 3 results) No results for input(s): PROBNP in the last 8760 hours. HbA1C: No results for input(s): HGBA1C in the last 72 hours. CBG: Recent Labs  Lab 03/09/21 0729 03/09/21 1135 03/09/21 1626 03/09/21 2116 03/10/21 0749  GLUCAP 285* 92 221* 142* 109*    Lipid Profile: No results for input(s): CHOL, HDL, LDLCALC, TRIG, CHOLHDL, LDLDIRECT in the last 72 hours. Thyroid Function Tests: No results for input(s): TSH, T4TOTAL, FREET4, T3FREE, THYROIDAB in the last 72 hours. Anemia Panel: No results for input(s): VITAMINB12, FOLATE, FERRITIN, TIBC, IRON, RETICCTPCT in the last 72 hours.  Sepsis Labs: No results for input(s): PROCALCITON, LATICACIDVEN in the last 168 hours.  Recent Results (from the past 240 hour(s))  Aerobic/Anaerobic Culture w Gram Stain (surgical/deep wound)     Status: None   Collection Time: 02/28/21  4:11 PM   Specimen: PATH Soft tissue  Result Value Ref Range Status   Specimen Description   Final    TISSUE LEFT SHOULDER A Performed at [facility], [address], [place], [place]    Special Requests   Final    NONE Performed at [facility], [address], [place], [place]    Gram Stain   Final    RARE WBC PRESENT, PREDOMINANTLY PMN NO ORGANISMS SEEN    Culture   Final    RARE PSEUDOMONAS AERUGINOSA NO ANAEROBES ISOLATED Performed at [facility], [address], [place], [address] [place]    Report Status 03/05/2021 FINAL  Final   Organism ID, Bacteria PSEUDOMONAS AERUGINOSA  Final      Susceptibility   Pseudomonas aeruginosa - MIC*    CEFTAZIDIME 4 SENSITIVE Sensitive     CIPROFLOXACIN <=0.25 SENSITIVE Sensitive     GENTAMICIN <=1 SENSITIVE  Sensitive     IMIPENEM 1 SENSITIVE Sensitive     PIP/TAZO 8 SENSITIVE Sensitive     CEFEPIME 2 SENSITIVE Sensitive     * RARE PSEUDOMONAS AERUGINOSA  Aerobic/Anaerobic Culture w Gram Stain (surgical/deep wound)     Status: None   Collection Time: 02/28/21  4:12 PM   Specimen: PATH Soft tissue  Result Value Ref Range Status   Specimen Description   Final    TISSUE  RIGHT SHOULDER Performed at [facility], [address] [place], [place]  Special Requests   Final    NONE Performed at [facility], [address] [place], [place] [address]    Gram Stain NO WBC SEEN NO ORGANISMS SEEN   Final   Culture   Final    No growth aerobically or anaerobically. Performed at [facility], [address] [place], [place] [address]    Report Status 03/05/2021 FINAL  Final  Surgical pcr screen     Status: None   Collection Time: 03/06/21  3:07 AM   Specimen: Nasal Mucosa; Nasal Swab  Result Value Ref Range Status   MRSA, PCR NEGATIVE NEGATIVE Final   Staphylococcus aureus NEGATIVE NEGATIVE Final    Comment: (NOTE) The Xpert SA Assay (FDA approved for NASAL specimens in patients 54 years of age and older), is one component of a comprehensive surveillance program. It is not intended to diagnose infection nor to guide or monitor treatment. Performed at [facility], [address] [place], [place] [address]           Radiology Studies: No results found.      Scheduled Meds:  (feeding supplement) PROSource Plus  30 mL Oral QID   acetaminophen  500 mg Oral QID   vitamin C  500 mg Oral BID   buPROPion  150 mg Oral BID   [START ON 03/12/2021] chlorhexidine  60 mL Topical Once   Chlorhexidine Gluconate Cloth  6 each Topical Daily   enoxaparin (LOVENOX) injection  40 mg Subcutaneous Q24H   feeding supplement  237 mL Oral BID BM   ferrous sulfate  325 mg Oral BID WC   gabapentin  200 mg Oral BID    HYDROmorphone   Intravenous Q4H   insulin aspart  0-15 Units Subcutaneous TID WC   insulin aspart  0-5 Units Subcutaneous QHS   insulin glargine  25 Units Subcutaneous Daily   mouth rinse  15 mL Mouth Rinse BID   nutrition supplement (JUVEN)  1 packet Oral BID BM   polyethylene glycol  17 g Oral Daily   [START ON 03/12/2021] povidone-iodine  2 application Topical Once   senna-docusate  1 tablet Oral BID   sodium chloride flush  5 mL Intracatheter Q8H   zinc sulfate  220 mg Oral Daily   Continuous Infusions:  0.45 % NaCl with KCl 20 mEq / L Stopped (03/07/21 1218)   sodium chloride 250 mL (03/06/21 0417)   ceFEPime (MAXIPIME) IV 2 g (03/10/21 0608)     LOS: 38 days   Time spent= 27 mins  Hughie Clossavi Aidon Klemens, MD [facility]  If 7PM-7AM, please contact night-coverage  03/10/2021, 9:25 AM

## 2021-03-11 LAB — CBC WITH DIFFERENTIAL/PLATELET
Abs Immature Granulocytes: 0.1 10*3/uL — ABNORMAL HIGH (ref 0.00–0.07)
Basophils Absolute: 0.1 10*3/uL (ref 0.0–0.1)
Basophils Relative: 1 %
Eosinophils Absolute: 0.2 10*3/uL (ref 0.0–0.5)
Eosinophils Relative: 2 %
HCT: 25.3 % — ABNORMAL LOW (ref 39.0–52.0)
Hemoglobin: 8 g/dL — ABNORMAL LOW (ref 13.0–17.0)
Immature Granulocytes: 1 %
Lymphocytes Relative: 23 %
Lymphs Abs: 2.6 10*3/uL (ref 0.7–4.0)
MCH: 28.6 pg (ref 26.0–34.0)
MCHC: 31.6 g/dL (ref 30.0–36.0)
MCV: 90.4 fL (ref 80.0–100.0)
Monocytes Absolute: 0.9 10*3/uL (ref 0.1–1.0)
Monocytes Relative: 8 %
Neutro Abs: 7.5 10*3/uL (ref 1.7–7.7)
Neutrophils Relative %: 65 %
Platelets: 521 10*3/uL — ABNORMAL HIGH (ref 150–400)
RBC: 2.8 MIL/uL — ABNORMAL LOW (ref 4.22–5.81)
RDW: 16 % — ABNORMAL HIGH (ref 11.5–15.5)
WBC: 11.4 10*3/uL — ABNORMAL HIGH (ref 4.0–10.5)
nRBC: 0 % (ref 0.0–0.2)

## 2021-03-11 LAB — GLUCOSE, CAPILLARY
Glucose-Capillary: 144 mg/dL — ABNORMAL HIGH (ref 70–99)
Glucose-Capillary: 133 mg/dL — ABNORMAL HIGH (ref 70–99)
Glucose-Capillary: 148 mg/dL — ABNORMAL HIGH (ref 70–99)
Glucose-Capillary: 122 mg/dL — ABNORMAL HIGH (ref 70–99)

## 2021-03-11 MED ORDER — ENSURE PRE-SURGERY PO LIQD
296.0000 mL | Freq: Once | ORAL | Status: AC
  Administered 2021-03-11: 296 mL via ORAL
  Filled 2021-03-11: qty 296

## 2021-03-11 NOTE — TOC Progression Note (Signed)
Transition of Care [facility]) - Progression Note    Patient Details  Name: John Ware MRN: [id number] Date of Birth: 10-14-1965  Transition of Care [facility]) CM/SW Contact  Althea Backs, Olegario Messier, RN Phone Number: 03/11/2021, 1:18 PM  Clinical Narrative: R&L shoulder multiple surgeries, & debridements. Ongoing surgeries.L/R shoulder-hemovac;L shoulder-wound vac. Continue to follow for d/c needs.  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    Expected Discharge Plan: Home w Home Health Services Barriers to Discharge: Continued Medical Work up  Expected Discharge Plan and Services Expected Discharge Plan: Home w Home Health Services   Discharge Planning Services: CM Consult   Living arrangements for the past 2 months: Single Family Home                                       Social Determinants of Health (SDOH) Interventions    Readmission Risk Interventions Readmission Risk Prevention Plan 02/16/2021  Transportation Screening Complete  PCP or Specialist Appt within 3-5 Days Complete  HRI or Home Care Consult Complete  Social Work Consult for Recovery Care Planning/Counseling Complete  Palliative Care Screening Complete  Medication Review [employer]) Complete  Some recent data might be hidden

## 2021-03-11 NOTE — Progress Notes (Signed)
PROGRESS NOTE    John Ware  WUJ:[id number] DOB: 12/28/1965 DOA: 01/31/2021 PCP: Junie Spencer, FNP   Brief Narrative:  55 year old with history of chronic pain, HTN, DM2, COVID-positive at home 5/20 reporting of body pain, shoulder pain eventually came to the ER.  Found to have MSSA bacteremia started on IV Ancef.  Large fluid collection on the left lateral posterior chest and back was noted.  Underwent MRI T-spine and CT CAP showing large multiloculated effusion  and osteomyelitis of thoracic spine.  There was concerns for septic emboli but echo was negative for TEE performed on 6/13 showed large tricuspid vegetation.  General surgery and orthopedic consulted.  Underwent left shoulder debridement 02/08/2021.  Postop had increasing size of his abscess collection therefore general surgery performed repeat debridement with wound VAC placement.  Dressing management per surgical team.  CT maxillofacial showed oral lucencies therefore oral surgery consulted.  Oral surgery did not think this was acute and Low suspicion for source per ID.  Underwent repeat I&D by Ortho on 02/28/2021.  Culture showing Pseudomonas.  Antibiotics switched to cefepime on 03/04/2020.  Patient underwent another irrigation and debridement of the left shoulder on 03/06/2021 followed by another left shoulder irrigation and debridement with wound VAC placement on 03/08/2021.   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  -Large left  posterior wall and bilateral psoas muscle abscess, bilateral septic shoulder joint, osteomyelitis and discitis.  Also possible floaters but endopthalmitis was ruled out. - Left shoulder debridement 6/16, right shoulder debridement 6/17, drain placement by IR 6/24.  Status post bilateral shoulder debridement with wound VAC placement 6/29.  Pathology for necrotic lymph node -benign adipose tissue -  ID following.  No end date planned at this time. - PCA for pain control. Oxycodone 10mg  q4hrs prn,. -Repeat CT shoulder performed.  Status post repeat I&D 7/6, cultures growing Pseudomonas.  Antibiotics transitioned to cefepime on 03/07/2021.  Patient underwent another irrigation and debridement of the left shoulder on 03/06/2021.  Had wound VAC on the left shoulder and Hemovac on bilateral shoulders.  Underwent repeat MRI left shoulder 03/07/2021 which shows continued septic arthritis and acute osteomyelitis.  Full-thickness retracted tears of the supraspinatus and infraspinatus tendons.  Intramuscular edema was controlled within the supraspinatus, infraspinatus and anterior deltoid muscles.  Likely myositis.   -He underwent another left shoulder irrigation and debridement with wound VAC placement on 03/08/2021 however both Hemovacs and bilateral shoulders were removed.  He is scheduled for another procedure on Monday, 03/12/2021.  ID recommends continuing cefepime for total of 4 more weeks starting 7/14.  Will need PICC line and removal of left IJ after last procedure.  Essential hypertension: Takes amlodipine and lisinopril at home.  Both on hold.  Blood pressure controlled.  Poor dentition with dental lucencies per CT scan. Oral surgery, Dr 8/14 didn't think there is anything acute. Low suspicious for potential source for ID.  Follow-up outpatient dentist  Anemia of chronic  disease.   -Oral iron supplements with bowel regimen.  Status post 2 units PRBC 6/30.  Hemoglobin is stable over 7.  Closely monitor.    DKA.  Resolved.  Type 2 diabetes mellitus.  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   Blood sugar remains labile but fairly controlled now.  We will continue Lantus 25 units and SSI.    Ascending aorta dilatation (HCC) - TTE on 6/10 notes mild dilation of ascending aorta, 38 mm - TEE shows normal thoracic and ascending aorta noted on 02/05/21   Anxiety Continue Wellbutrin and continue to hold Zoloft.  Monitor sodium.   COVID-19 infection-resolved  Chronic hyponatremia: At his baseline.  DVT prophylaxis: Lovenox Code Status: Full Family Communication:  at bedside.  Status is: Inpatient  Remains inpatient appropriate because:Inpatient level of care appropriate due to severity of illness.  Maintain hospital stay until cleared by orthopedic and general surgery.  Complex surgical case with multiple wounds requiring debridement  Dispo: The patient is from: Home              Anticipated d/c is to: SNF              Patient currently is not medically stable to d/c.   Difficult to place patient No    Pressure Injury 01/31/21 Buttocks Right;Left;Posterior Stage 2 -  Partial thickness loss of dermis presenting as a shallow open injury with a red, pink wound bed without slough. (Active)  01/31/21 1546  Location: Buttocks  Location Orientation: Right;Left;Posterior  Staging: Stage 2 -  Partial thickness loss of dermis presenting as a shallow open injury with a red, pink wound bed without slough.  Wound Description (Comments):   Present on Admission: Yes      Subjective: Up walking around the room-- feeling better  Examination:  General: Appearance:     Overweight male in no acute distress     Lungs:     respirations unlabored  Heart:    Normal heart rate.     MS:   All extremities are intact.    Neurologic:   Awake, alert, oriented x 3       Objective: Vitals:   03/10/21 2035 03/11/21 0501 03/11/21 0513  03/11/21 0853  BP: 107/73  (!) 131/91   Pulse: 88  78   Resp: 14 14 18 16   Temp: 98.3 F (36.8 C)  98 F (36.7 C)   TempSrc: Oral  Oral   SpO2: 99%  100% 100%  Weight:      Height:        Intake/Output Summary (Last 24 hours) at 03/11/2021 1052 Last data filed at 03/11/2021 0911 Gross per 24 hour  Intake 480 ml  Output 3525 ml  Net -3045 ml   Filed Weights   02/08/21 1540 02/20/21 1419 02/27/21 1443  Weight: 97.3 kg 97.3 kg 93 kg     Data Reviewed:   CBC: Recent Labs  Lab 03/07/21 0500 03/08/21 0305 03/09/21 0359 03/10/21 0328 03/11/21 0255  WBC 7.1 11.5* 7.3 10.7* 11.4*  NEUTROABS 5.7 8.2* 6.0 7.1 7.5  HGB 7.8* 7.5* 7.4* 7.4* 8.0*  HCT 24.4* 24.0* 23.6* 23.9* 25.3*  MCV 90.0 90.9 91.1 92.3 90.4  PLT 480* 509* 482* 503* 521*   Basic Metabolic Panel: Recent Labs  Lab 03/05/21 0340 03/06/21 0402 03/09/21 0359  NA 137 135 132*  K 3.8 4.0 4.3  CL 99 98 95*  CO2 29 28 29   GLUCOSE 136*  144* 327*  BUN 13 14 18   CREATININE 0.38* 0.40* 0.46*  CALCIUM 8.4* 8.5* 8.3*   GFR: Estimated Creatinine Clearance: 122.3 mL/min (A) (by C-G formula based on SCr of 0.46 mg/dL (L)). Liver Function Tests: No results for input(s): AST, ALT, ALKPHOS, BILITOT, PROT, ALBUMIN in the last 168 hours. No results for input(s): LIPASE, AMYLASE in the last 168 hours. No results for input(s): AMMONIA in the last 168 hours. Coagulation Profile: No results for input(s): INR, PROTIME in the last 168 hours. Cardiac Enzymes: No results for input(s): CKTOTAL, CKMB, CKMBINDEX, TROPONINI in the last 168 hours. BNP (last 3 results) No results for input(s): PROBNP in the last 8760 hours. HbA1C: No results for input(s): HGBA1C in the last 72 hours. CBG: Recent Labs  Lab 03/10/21 0749 03/10/21 1148 03/10/21 1609 03/10/21 2107 03/11/21 0850  GLUCAP 109* 146* 87 156* 144*   Lipid Profile: No results for input(s): CHOL, HDL, LDLCALC, TRIG, CHOLHDL, LDLDIRECT in the last 72  hours. Thyroid Function Tests: No results for input(s): TSH, T4TOTAL, FREET4, T3FREE, THYROIDAB in the last 72 hours. Anemia Panel: No results for input(s): VITAMINB12, FOLATE, FERRITIN, TIBC, IRON, RETICCTPCT in the last 72 hours.  Sepsis Labs: No results for input(s): PROCALCITON, LATICACIDVEN in the last 168 hours.  Recent Results (from the past 240 hour(s))  Surgical pcr screen     Status: None   Collection Time: 03/06/21  3:07 AM   Specimen: Nasal Mucosa; Nasal Swab  Result Value Ref Range Status   MRSA, PCR NEGATIVE NEGATIVE Final   Staphylococcus aureus NEGATIVE NEGATIVE Final    Comment: (NOTE) The Xpert SA Assay (FDA approved for NASAL specimens in patients 54 years of age and older), is one component of a comprehensive surveillance program. It is not intended to diagnose infection nor to guide or monitor treatment. Performed at [facility], [address] [place], [place] [address]           Radiology Studies: No results found.      Scheduled Meds:  (feeding supplement) PROSource Plus  30 mL Oral QID   acetaminophen  500 mg Oral QID   vitamin C  500 mg Oral BID   buPROPion  150 mg Oral BID   [START ON 03/12/2021] chlorhexidine  60 mL Topical Once   Chlorhexidine Gluconate Cloth  6 each Topical Daily   enoxaparin (LOVENOX) injection  40 mg Subcutaneous Q24H   feeding supplement  237 mL Oral BID BM   ferrous sulfate  325 mg Oral BID WC   gabapentin  200 mg Oral BID   HYDROmorphone   Intravenous Q4H   insulin aspart  0-15 Units Subcutaneous TID WC   insulin aspart  0-5 Units Subcutaneous QHS   insulin glargine  25 Units Subcutaneous Daily   mouth rinse  15 mL Mouth Rinse BID   nutrition supplement (JUVEN)  1 packet Oral BID BM   polyethylene glycol  17 g Oral Daily   [START ON 03/12/2021] povidone-iodine  2 application Topical Once   senna-docusate  1 tablet Oral BID   sodium chloride flush  5 mL Intracatheter Q8H   zinc sulfate   220 mg Oral Daily   Continuous Infusions:  0.45 % NaCl with KCl 20 mEq / L Stopped (03/07/21 1218)   sodium chloride 250 mL (03/06/21 0417)   ceFEPime (MAXIPIME) IV 2 g (03/11/21 0529)     LOS: 39 days   Time spent= 31 mins  Joseph ArtJessica U Vernita Tague, DO [facility]  If 7PM-7AM, please contact night-coverage  03/11/2021, 10:52 AM

## 2021-03-12 ENCOUNTER — Inpatient Hospital Stay (HOSPITAL_COMMUNITY): Payer: Commercial Managed Care - PPO | Admitting: Certified Registered Nurse Anesthetist

## 2021-03-12 ENCOUNTER — Encounter (HOSPITAL_COMMUNITY)
Admission: EM | Disposition: A | Discharge status: Home Health | Payer: Self-pay | Source: Home / Self Care | Attending: Internal Medicine

## 2021-03-12 HISTORY — PX: INCISION AND DRAINAGE ABSCESS: SHX5864

## 2021-03-12 LAB — CBC WITH DIFFERENTIAL/PLATELET
Abs Immature Granulocytes: 0.11 10*3/uL — ABNORMAL HIGH (ref 0.00–0.07)
Basophils Absolute: 0.1 10*3/uL (ref 0.0–0.1)
Basophils Relative: 1 %
Eosinophils Absolute: 0.2 10*3/uL (ref 0.0–0.5)
Eosinophils Relative: 2 %
HCT: 27 % — ABNORMAL LOW (ref 39.0–52.0)
Hemoglobin: 8.4 g/dL — ABNORMAL LOW (ref 13.0–17.0)
Immature Granulocytes: 1 %
Lymphocytes Relative: 19 %
Lymphs Abs: 2.3 10*3/uL (ref 0.7–4.0)
MCH: 28.3 pg (ref 26.0–34.0)
MCHC: 31.1 g/dL (ref 30.0–36.0)
MCV: 90.9 fL (ref 80.0–100.0)
Monocytes Absolute: 1.1 10*3/uL — ABNORMAL HIGH (ref 0.1–1.0)
Monocytes Relative: 9 %
Neutro Abs: 8.4 10*3/uL — ABNORMAL HIGH (ref 1.7–7.7)
Neutrophils Relative %: 68 %
Platelets: 565 10*3/uL — ABNORMAL HIGH (ref 150–400)
RBC: 2.97 MIL/uL — ABNORMAL LOW (ref 4.22–5.81)
RDW: 16.5 % — ABNORMAL HIGH (ref 11.5–15.5)
WBC: 12.1 10*3/uL — ABNORMAL HIGH (ref 4.0–10.5)
nRBC: 0 % (ref 0.0–0.2)

## 2021-03-12 LAB — TYPE AND SCREEN
ABO/RH(D): B POS
Antibody Screen: NEGATIVE

## 2021-03-12 LAB — GLUCOSE, CAPILLARY
Glucose-Capillary: 124 mg/dL — ABNORMAL HIGH (ref 70–99)
Glucose-Capillary: 92 mg/dL (ref 70–99)
Glucose-Capillary: 96 mg/dL (ref 70–99)
Glucose-Capillary: 128 mg/dL — ABNORMAL HIGH (ref 70–99)
Glucose-Capillary: 361 mg/dL — ABNORMAL HIGH (ref 70–99)

## 2021-03-12 LAB — BASIC METABOLIC PANEL
Anion gap: 9 (ref 5–15)
BUN: 21 mg/dL — ABNORMAL HIGH (ref 6–20)
CO2: 27 mmol/L (ref 22–32)
Calcium: 8.6 mg/dL — ABNORMAL LOW (ref 8.9–10.3)
Chloride: 96 mmol/L — ABNORMAL LOW (ref 98–111)
Creatinine, Ser: 0.41 mg/dL — ABNORMAL LOW (ref 0.61–1.24)
GFR, Estimated: >60 mL/min (ref >60)
Glucose, Bld: 135 mg/dL — ABNORMAL HIGH (ref 70–99)
Potassium: 4 mmol/L (ref 3.5–5.1)
Sodium: 132 mmol/L — ABNORMAL LOW (ref 135–145)

## 2021-03-12 SURGERY — INCISION AND DRAINAGE, ABSCESS
Anesthesia: General | Laterality: Left

## 2021-03-12 MED ORDER — ACETAMINOPHEN 10 MG/ML IV SOLN: 1000.0000 mg | Freq: Once | INTRAVENOUS | Status: DC | PRN

## 2021-03-12 MED ORDER — SUGAMMADEX SODIUM 200 MG/2ML IV SOLN
INTRAVENOUS | Status: DC | PRN
  Administered 2021-03-12: 200 mg via INTRAVENOUS

## 2021-03-12 MED ORDER — LIDOCAINE 2% (20 MG/ML) 5 ML SYRINGE
INTRAMUSCULAR | Status: AC
  Filled 2021-03-12: qty 5

## 2021-03-12 MED ORDER — MIDAZOLAM HCL 5 MG/5ML IJ SOLN
INTRAMUSCULAR | Status: DC | PRN
  Administered 2021-03-12: 2 mg via INTRAVENOUS

## 2021-03-12 MED ORDER — HYDROMORPHONE HCL 1 MG/ML IJ SOLN: 0.2500 mg | INTRAMUSCULAR | Status: DC | PRN

## 2021-03-12 MED ORDER — MIDAZOLAM HCL 2 MG/2ML IJ SOLN
INTRAMUSCULAR | Status: AC
  Filled 2021-03-12: qty 2

## 2021-03-12 MED ORDER — FENTANYL CITRATE (PF) 100 MCG/2ML IJ SOLN
INTRAMUSCULAR | Status: DC | PRN
  Administered 2021-03-12: 50 ug via INTRAVENOUS
  Administered 2021-03-12: 100 ug via INTRAVENOUS

## 2021-03-12 MED ORDER — PROPOFOL 10 MG/ML IV BOLUS
INTRAVENOUS | Status: AC
  Filled 2021-03-12: qty 20

## 2021-03-12 MED ORDER — INSULIN GLARGINE 100 UNIT/ML ~~LOC~~ SOLN
12.0000 [IU] | Freq: Once | SUBCUTANEOUS | Status: AC
  Administered 2021-03-12: 12 [IU] via SUBCUTANEOUS
  Filled 2021-03-12: qty 0.12

## 2021-03-12 MED ORDER — OXYCODONE HCL 5 MG PO TABS: 5.0000 mg | ORAL_TABLET | Freq: Once | ORAL | Status: DC | PRN

## 2021-03-12 MED ORDER — INSULIN GLARGINE 100 UNIT/ML ~~LOC~~ SOLN
25.0000 [IU] | Freq: Every day | SUBCUTANEOUS | Status: DC
  Administered 2021-03-13 – 2021-03-15 (×3): 25 [IU] via SUBCUTANEOUS
  Filled 2021-03-12 (×3): qty 0.25

## 2021-03-12 MED ORDER — PHENYLEPHRINE HCL-NACL 10-0.9 MG/250ML-% IV SOLN
INTRAVENOUS | Status: DC | PRN
  Administered 2021-03-12: 40 ug/min via INTRAVENOUS

## 2021-03-12 MED ORDER — LIDOCAINE 2% (20 MG/ML) 5 ML SYRINGE
INTRAMUSCULAR | Status: DC | PRN
  Administered 2021-03-12: 100 mg via INTRAVENOUS

## 2021-03-12 MED ORDER — PROMETHAZINE HCL 25 MG/ML IJ SOLN: 6.2500 mg | INTRAMUSCULAR | Status: DC | PRN

## 2021-03-12 MED ORDER — LACTATED RINGERS IV SOLN: INTRAVENOUS | Status: DC | PRN

## 2021-03-12 MED ORDER — PROPOFOL 10 MG/ML IV BOLUS
INTRAVENOUS | Status: DC | PRN
  Administered 2021-03-12: 100 mg via INTRAVENOUS

## 2021-03-12 MED ORDER — SODIUM CHLORIDE 0.9 % IR SOLN
Status: DC | PRN
  Administered 2021-03-12 (×2): 3000 mL

## 2021-03-12 MED ORDER — ONDANSETRON HCL 4 MG/2ML IJ SOLN
INTRAMUSCULAR | Status: AC
  Filled 2021-03-12: qty 2

## 2021-03-12 MED ORDER — PHENYLEPHRINE HCL-NACL 10-0.9 MG/250ML-% IV SOLN
INTRAVENOUS | Status: AC
  Filled 2021-03-12: qty 250

## 2021-03-12 MED ORDER — OXYCODONE HCL 5 MG/5ML PO SOLN: 5.0000 mg | Freq: Once | ORAL | Status: DC | PRN

## 2021-03-12 MED ORDER — DEXAMETHASONE SODIUM PHOSPHATE 4 MG/ML IJ SOLN
INTRAMUSCULAR | Status: DC | PRN
  Administered 2021-03-12: 10 mg via INTRAVENOUS

## 2021-03-12 MED ORDER — ROCURONIUM BROMIDE 10 MG/ML (PF) SYRINGE
PREFILLED_SYRINGE | INTRAVENOUS | Status: AC
  Filled 2021-03-12: qty 10

## 2021-03-12 MED ORDER — DEXAMETHASONE SODIUM PHOSPHATE 10 MG/ML IJ SOLN
INTRAMUSCULAR | Status: AC
  Filled 2021-03-12: qty 1

## 2021-03-12 MED ORDER — ROCURONIUM BROMIDE 10 MG/ML (PF) SYRINGE
PREFILLED_SYRINGE | INTRAVENOUS | Status: DC | PRN
  Administered 2021-03-12: 60 mg via INTRAVENOUS

## 2021-03-12 MED ORDER — FENTANYL CITRATE (PF) 250 MCG/5ML IJ SOLN
INTRAMUSCULAR | Status: AC
  Filled 2021-03-12: qty 5

## 2021-03-12 MED ORDER — ONDANSETRON HCL 4 MG/2ML IJ SOLN
INTRAMUSCULAR | Status: DC | PRN
  Administered 2021-03-12: 4 mg via INTRAVENOUS

## 2021-03-12 SURGICAL SUPPLY — 46 items
AID PSTN UNV HD RSTRNT DISP (MISCELLANEOUS) ×1
BOOTIES KNEE HIGH SLOAN (MISCELLANEOUS) ×4 IMPLANT
COVER SURGICAL LIGHT HANDLE (MISCELLANEOUS) ×2 IMPLANT
DRAPE U-SHAPE 47X51 STRL (DRAPES) ×2 IMPLANT
DRSG MEPILEX BORDER 4X8 (GAUZE/BANDAGES/DRESSINGS) IMPLANT
DRSG PAD ABDOMINAL 8X10 ST (GAUZE/BANDAGES/DRESSINGS) ×6 IMPLANT
DURAPREP 26ML APPLICATOR (WOUND CARE) ×1 IMPLANT
ELECT REM PT RETURN 15FT ADLT (MISCELLANEOUS) ×2 IMPLANT
EVACUATOR 1/8 PVC DRAIN (DRAIN) ×2 IMPLANT
FACESHIELD WRAPAROUND (MASK) ×2 IMPLANT
FACESHIELD WRAPAROUND OR TEAM (MASK) IMPLANT
GAUZE SPONGE 4X4 12PLY STRL (GAUZE/BANDAGES/DRESSINGS) ×2 IMPLANT
GAUZE XEROFORM 1X8 LF (GAUZE/BANDAGES/DRESSINGS) ×2 IMPLANT
GLOVE SRG 8 PF TXTR STRL LF DI (GLOVE) ×1 IMPLANT
GLOVE SURG ENC MOIS LTX SZ7 (GLOVE) ×2 IMPLANT
GLOVE SURG ENC MOIS LTX SZ7.5 (GLOVE) ×2 IMPLANT
GLOVE SURG UNDER POLY LF SZ7 (GLOVE) ×2 IMPLANT
GLOVE SURG UNDER POLY LF SZ8 (GLOVE) ×2
GOWN STRL REUS W/TWL LRG LVL3 (GOWN DISPOSABLE) ×4 IMPLANT
HANDPIECE INTERPULSE COAX TIP (DISPOSABLE)
KIT BASIN OR (CUSTOM PROCEDURE TRAY) ×2 IMPLANT
KIT TURNOVER KIT A (KITS) ×2 IMPLANT
MANIFOLD NEPTUNE II (INSTRUMENTS) ×2 IMPLANT
NS IRRIG 1000ML POUR BTL (IV SOLUTION) ×2 IMPLANT
PACK SHOULDER (CUSTOM PROCEDURE TRAY) ×2 IMPLANT
PAD ARMBOARD 7.5X6 YLW CONV (MISCELLANEOUS) ×4 IMPLANT
RESTRAINT HEAD UNIVERSAL NS (MISCELLANEOUS) ×2 IMPLANT
SET HNDPC FAN SPRY TIP SCT (DISPOSABLE) IMPLANT
SET IRRIG Y TYPE TUR BLADDER L (SET/KITS/TRAYS/PACK) ×2 IMPLANT
SPONGE T-LAP 18X18 ~~LOC~~+RFID (SPONGE) ×3 IMPLANT
SUPPORT WRAP ARM LG (MISCELLANEOUS) IMPLANT
SUT ETHILON 3 0 PS 1 (SUTURE) ×2 IMPLANT
SUT VIC AB 0 CT1 27 (SUTURE)
SUT VIC AB 0 CT1 27XBRD ANBCTR (SUTURE) IMPLANT
SUT VIC AB 2-0 CT1 27 (SUTURE)
SUT VIC AB 2-0 CT1 TAPERPNT 27 (SUTURE) IMPLANT
SWAB COLLECTION DEVICE MRSA (MISCELLANEOUS) ×1 IMPLANT
SWAB CULTURE ESWAB REG 1ML (MISCELLANEOUS) IMPLANT
TAPE PAPER 3X10 WHT MICROPORE (GAUZE/BANDAGES/DRESSINGS) ×1 IMPLANT
TOWEL OR 17X26 10 PK STRL BLUE (TOWEL DISPOSABLE) ×2 IMPLANT
TOWEL OR NON WOVEN STRL DISP B (DISPOSABLE) ×2 IMPLANT
TRAY PREP A LATEX SAFE STRL (SET/KITS/TRAYS/PACK) ×1 IMPLANT
TUBING CONNECTING 10 (TUBING) ×2 IMPLANT
UNDERPAD 30X36 HEAVY ABSORB (UNDERPADS AND DIAPERS) ×4 IMPLANT
WATER STERILE IRR 1000ML POUR (IV SOLUTION) ×1 IMPLANT
YANKAUER SUCT BULB TIP NO VENT (SUCTIONS) ×2 IMPLANT

## 2021-03-12 NOTE — Anesthesia Procedure Notes (Signed)
Procedure Name: Intubation Date/Time: 03/12/2021 1:13 PM Performed by: Vanessa Rosebud, CRNA Pre-anesthesia Checklist: Patient identified, Emergency Drugs available, Suction available and Patient being monitored Patient Re-evaluated:Patient Re-evaluated prior to induction Oxygen Delivery Method: Circle system utilized Preoxygenation: Pre-oxygenation with 100% oxygen Induction Type: IV induction Ventilation: Mask ventilation without difficulty Laryngoscope Size: 2 and Miller Grade View: Grade I Tube type: Oral Tube size: 7.5 mm Number of attempts: 1 Airway Equipment and Method: Stylet Placement Confirmation: ETT inserted through vocal cords under direct vision, positive ETCO2 and breath sounds checked- equal and bilateral Secured at: 21 cm Tube secured with: Tape Dental Injury: Teeth and Oropharynx as per pre-operative assessment

## 2021-03-12 NOTE — Op Note (Signed)
03/12/2021  2:11 PM  PATIENT:  John Ware    PRE-OPERATIVE DIAGNOSIS: Left shoulder glenohumeral septic arthritis with deep shoulder abscess  POST-OPERATIVE DIAGNOSIS:  Same  PROCEDURE: Left shoulder excisional debridement, glenohumeral joint, deep abscess, subacromial space  SURGEON:  Eulas Post, MD  PHYSICIAN ASSISTANT: None  ANESTHESIA:   General  PREOPERATIVE INDICATIONS:  John Ware is a  55 y.o. male with a diagnosis of SEPTIC LEFT SHOULDER who failed conservative measures and elected for surgical management.  He has had multiple previous surgeries with a wound VAC that appeared clean.  The risks benefits and alternatives were discussed with the patient preoperatively including but not limited to the risks of infection, bleeding, nerve injury, cardiopulmonary complications, the need for revision surgery, among others, and the patient was willing to proceed.  ESTIMATED BLOOD LOSS: 50 mL  OPERATIVE IMPLANTS: Hemovac drain x1 with 2 tubes  OPERATIVE FINDINGS: There was really no significant purulence.  The deltopectoral space had sealed closed, when I opened the deeper space he did have some serosanguineous fluid that was excavated from the joint itself.  The tissue overall looked reasonably healthy.  I did not find any dramatic purulence expression, but it was the thin serous fluid that I had seen on his previous debridements.  OPERATIVE PROCEDURE: The patient was brought to the operating room and placed in supine position.  General anesthesia was administered.  He is already on IV antibiotics.  He was placed in the beachchair position under general anesthesia.  The left upper extremity was prepped and draped in usual sterile fashion.  Timeout performed.  I removed his previous wound VAC prior to prepping and draping.  I opened his previous wound, and explored the deep layer below the deltopectoral interval.  I did get some serous drainage at this point, but no true  purulence.  I used a scalpel to excise portions of the CA ligament, as well as some residual superior cuff.  Because of my concern about still getting some fluid from the joint, I did use a rongeur to remove tissue from the posterior aspect of the joint as well as the residual subacromial space.  I internally and externally rotated the humerus aggressively, and the humeral head was well exposed, and the articular cartilage present.  The area where I had vented the proximal humerus actually looked pretty good.  I irrigated a total of 6 L of fluid through his joint and subdeltoid bursa and deep tissue, and then placed a deep drain through the lateral portal into the joint, and then closed the remainder of his tissue with nylon sutures and placed a subcutaneous drain in the deltopectoral interval exiting out the inferior most portion of the incision.  Sterile gauze was applied, and he was awakened and returned to the PACU in stable and satisfactory condition.  There were no complications and he tolerated the procedure well.  Debridement type: Excisional Debridement  Side: left  Body Location: shoulder   Tools used for debridement: scalpel, curette, and rongeur  Pre-debridement Wound size (cm):   Length: 15        Width: 5     Depth: 5   Post-debridement Wound size (cm):   Length: 0        Width: 0     Depth: 0   Debridement depth beyond dead/damaged tissue down to healthy viable tissue: yes  Tissue layer involved: skin, subcutaneous tissue, muscle / fascia, bone  Nature of tissue removed: Slough and  Devitalized Tissue  Irrigation volume: 6L     Irrigation fluid type: Normal Saline   Eulas Post, MD

## 2021-03-12 NOTE — Transfer of Care (Signed)
Immediate Anesthesia Transfer of Care Note  Patient: John Ware  Procedure(s) Performed: INCISION AND DRAINAGE ABSCESS (Left)  Patient Location: PACU  Anesthesia Type:General  Level of Consciousness: awake, alert , oriented and patient cooperative  Airway & Oxygen Therapy: Patient Spontanous Breathing and Patient connected to face mask  Post-op Assessment: Report given to RN and Post -op Vital signs reviewed and stable  Post vital signs: Reviewed and stable  Last Vitals:  Vitals Value Taken Time  BP 102/66 03/12/21 1422  Temp    Pulse 71 03/12/21 1426  Resp 11 03/12/21 1426  SpO2 100 % 03/12/21 1426  Vitals shown include unvalidated device data.  Last Pain:  Vitals:   03/12/21 1206  TempSrc: Oral  PainSc:       Patients Stated Pain Goal: 2 (03/12/21 0914)  Complications: No notable events documented.

## 2021-03-12 NOTE — Anesthesia Preprocedure Evaluation (Signed)
Anesthesia Evaluation  Patient identified by MRN, date of birth, ID band Patient awake    Reviewed: Allergy & Precautions, NPO status , Patient's Chart, lab work & pertinent test results  Airway Mallampati: II  TM Distance: >3 FB Neck ROM: Full    Dental no notable dental hx.    Pulmonary neg pulmonary ROS, former smoker,    Pulmonary exam normal breath sounds clear to auscultation       Cardiovascular hypertension, Normal cardiovascular exam Rhythm:Regular Rate:Normal     Neuro/Psych negative neurological ROS  negative psych ROS   GI/Hepatic negative GI ROS, Neg liver ROS,   Endo/Other  diabetes  Renal/GU negative Renal ROS  negative genitourinary   Musculoskeletal negative musculoskeletal ROS (+)   Abdominal   Peds negative pediatric ROS (+)  Hematology negative hematology ROS (+)   Anesthesia Other Findings   Reproductive/Obstetrics negative OB ROS                             Anesthesia Physical Anesthesia Plan  ASA: 3  Anesthesia Plan: General   Post-op Pain Management:    Induction: Intravenous  PONV Risk Score and Plan: 2 and Ondansetron, Dexamethasone and Treatment may vary due to age or medical condition  Airway Management Planned: Oral ETT  Additional Equipment:   Intra-op Plan:   Post-operative Plan: Extubation in OR  Informed Consent: I have reviewed the patients History and Physical, chart, labs and discussed the procedure including the risks, benefits and alternatives for the proposed anesthesia with the patient or authorized representative who has indicated his/her understanding and acceptance.     Dental advisory given  Plan Discussed with: CRNA and Surgeon  Anesthesia Plan Comments:         Anesthesia Quick Evaluation

## 2021-03-12 NOTE — Progress Notes (Signed)
The patient has been re-examined, and the chart reviewed, and there have been no interval changes to the documented history and physical.    The risks, benefits, and alternatives have been discussed at length, and the patient is willing to proceed.   Plan repeat I&D and removal of wound vac.  Closure vs. Wet-dry after.  Left shoulder.   Eulas Post, MD

## 2021-03-12 NOTE — Progress Notes (Signed)
PROGRESS NOTE    John Ware  TFT:[id number] DOB: December 14, 1965 DOA: 01/31/2021 PCP: Junie Spencer, FNP   Brief Narrative:  55 year old with history of chronic pain, HTN, DM2, COVID-positive at home 5/20 reporting of body pain, shoulder pain eventually came to the ER.  Found to have MSSA bacteremia started on IV Ancef.  Large fluid collection on the left lateral posterior chest and back was noted.  Underwent MRI T-spine and CT CAP showing large multiloculated effusion  and osteomyelitis of thoracic spine.  There was concerns for septic emboli but echo was negative for TEE performed on 6/13 showed large tricuspid vegetation.  General surgery and orthopedic consulted.  Underwent left shoulder debridement 02/08/2021.  Postop had increasing size of his abscess collection therefore general surgery performed repeat debridement with wound VAC placement.  Dressing management per surgical team.  CT maxillofacial showed oral lucencies therefore oral surgery consulted.  Oral surgery did not think this was acute and Low suspicion for source per ID.  Underwent repeat I&D by Ortho on 02/28/2021.  Culture showing Pseudomonas.  Antibiotics switched to cefepime on 03/04/2020.  Patient underwent another irrigation and debridement of the left shoulder on 03/06/2021 followed by another left shoulder irrigation and debridement with wound VAC placement on 03/08/2021. Back to the OR on 7/18 for repeat I&D.     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  -Large left posterior wall and bilateral psoas muscle abscess, bilateral septic shoulder joint, osteomyelitis and discitis.  Also possible floaters but endopthalmitis was ruled out. - Left shoulder debridement 6/16, right shoulder debridement 6/17, drain placement by IR 6/24.  Status post bilateral shoulder debridement with wound VAC placement 6/29.  Pathology for necrotic lymph node -benign adipose tissue -  ID following.  No end date planned at this time. - PCA for pain control. Oxycodone 10mg  q4hrs prn,. -Repeat CT shoulder performed.  Status post repeat I&D 7/6, cultures growing Pseudomonas.  Antibiotics transitioned to cefepime on 03/07/2021.  Patient underwent another irrigation and debridement of the left shoulder on 03/06/2021.  Had wound VAC on the left shoulder and Hemovac on bilateral shoulders.  Underwent repeat MRI left shoulder 03/07/2021 which shows continued septic arthritis and acute osteomyelitis.  Full-thickness retracted tears of the supraspinatus and infraspinatus tendons.  Intramuscular edema was controlled within the supraspinatus, infraspinatus and anterior deltoid muscles.  Likely myositis.   -He underwent another left shoulder irrigation and debridement with wound VAC placement on 03/08/2021 however both Hemovacs and bilateral shoulders were removed.  He is scheduled for another procedure on Monday, 03/12/2021.  ID recommends continuing cefepime for total of 4 more weeks starting 7/14.  Will need PICC line and removal of left IJ after last procedure.  Essential hypertension: Takes amlodipine and lisinopril at home.  Both on hold.  Blood pressure controlled.  Poor dentition with dental lucencies per CT scan. Oral surgery, Dr 8/14 didn't think there is anything acute. Low suspicious for potential  source for ID.  Follow-up outpatient dentist  Anemia of chronic disease.   -Oral iron supplements with bowel regimen.  Status post 2 units PRBC 6/30.   Hemoglobin is stable over 7.  Closely monitor.   DKA.  Resolved.  Type 2 diabetes mellitus.  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   Blood sugar remains labile but fairly controlled now.  We will continue Lantus 25 units and SSI.    Ascending aorta dilatation (HCC) - TTE on 6/10 notes mild dilation of ascending aorta, 38 mm - TEE shows normal thoracic and ascending aorta noted on 02/05/21   Anxiety Continue Wellbutrin and continue to hold Zoloft.  Monitor sodium.   COVID-19 infection-resolved  Chronic hyponatremia: At his baseline.  DVT prophylaxis: Lovenox Code Status: Full Family Communication:  at bedside.  Status is: Inpatient  Remains inpatient appropriate because:Inpatient level of care appropriate due to severity of illness.  Maintain hospital stay until cleared by orthopedic and general surgery.  Complex surgical case with multiple wounds requiring debridement  Dispo: The patient is from: Home              Anticipated d/c is to: SNF              Patient currently is not medically stable to d/c.   Difficult to place patient No    Pressure Injury 01/31/21 Buttocks Right;Left;Posterior Stage 2 -  Partial thickness loss of dermis presenting as a shallow open injury with a red, pink wound bed without slough. (Active)  01/31/21 1546  Location: Buttocks  Location Orientation: Right;Left;Posterior  Staging: Stage 2 -  Partial thickness loss of dermis presenting as a shallow open injury with a red, pink wound bed without slough.  Wound Description (Comments):   Present on Admission: Yes      Subjective: No complaints except his chronic back pain  Examination:   General: Appearance:     Chronically ill appearing male in no acute distress     Lungs:      respirations unlabored  Heart:    Normal heart rate.    MS:   All extremities are intact.    Neurologic:   Awake, alert, oriented x 3. No apparent focal  neurological           defect.          Objective: Vitals:   03/12/21 0425 03/12/21 0513 03/12/21 0931 03/12/21 1206  BP:  118/77  119/77  Pulse:  72  78  Resp: 16 17 13 16   Temp:  98.1 F (36.7 C)  98.9 F (37.2 C)  TempSrc:    Oral  SpO2: 98% 98% 96% 100%  Weight:      Height:        Intake/Output Summary (Last 24 hours) at 03/12/2021 1316 Last data filed at 03/12/2021 1117 Gross per 24 hour  Intake --  Output 3035 ml  Net -3035 ml   Filed Weights   02/08/21 1540 02/20/21 1419 02/27/21 1443  Weight: 97.3 kg 97.3 kg 93 kg     Data Reviewed:   CBC: Recent Labs  Lab 03/08/21 0305 03/09/21 0359 03/10/21 0328 03/11/21 0255 03/12/21 0320  WBC 11.5* 7.3 10.7* 11.4* 12.1*  NEUTROABS 8.2* 6.0 7.1 7.5 8.4*  HGB 7.5* 7.4* 7.4* 8.0* 8.4*  HCT 24.0* 23.6* 23.9* 25.3* 27.0*  MCV 90.9 91.1 92.3 90.4 90.9  PLT 509* 482* 503* 521* 565*   Basic Metabolic Panel: Recent Labs  Lab 03/06/21 0402 03/09/21 0359 03/12/21 0320  NA 135 132* 132*  K 4.0 4.3 4.0  CL 98 95* 96*  CO2 28 29 27   GLUCOSE 144* 327* 135*  BUN 14 18 21*  CREATININE 0.40* 0.46* 0.41*  CALCIUM 8.5* 8.3* 8.6*   GFR: Estimated Creatinine Clearance: 122.3 mL/min (A) (by C-G formula based on SCr of 0.41 mg/dL (L)). Liver Function Tests: No results for input(s): AST, ALT, ALKPHOS, BILITOT, PROT, ALBUMIN in the last 168 hours. No results for input(s): LIPASE, AMYLASE in the last 168 hours. No results for input(s): AMMONIA in the last 168 hours. Coagulation Profile: No results for input(s): INR, PROTIME in the last 168 hours. Cardiac Enzymes: No results for input(s): CKTOTAL, CKMB, CKMBINDEX, TROPONINI in the last 168 hours. BNP (last 3 results) No results for input(s): PROBNP in the last 8760 hours. HbA1C: No results for input(s): HGBA1C in the last 72 hours. CBG: Recent Labs  Lab 03/11/21 1143 03/11/21 1612 03/11/21 2046 03/12/21 0719 03/12/21 1204  GLUCAP 133* 148* 122* 124* 92   Lipid  Profile: No results for input(s): CHOL, HDL, LDLCALC, TRIG, CHOLHDL, LDLDIRECT in the last 72 hours. Thyroid Function Tests: No results for input(s): TSH, T4TOTAL, FREET4, T3FREE, THYROIDAB in the last 72 hours. Anemia Panel: No results for input(s): VITAMINB12, FOLATE, FERRITIN, TIBC, IRON, RETICCTPCT in the last 72 hours.  Sepsis Labs: No results for input(s): PROCALCITON, LATICACIDVEN in the last 168 hours.  Recent Results (from the past 240 hour(s))  Surgical pcr screen     Status: None   Collection Time: 03/06/21  3:07 AM   Specimen: Nasal Mucosa; Nasal Swab  Result Value Ref Range Status   MRSA, PCR NEGATIVE NEGATIVE Final   Staphylococcus aureus NEGATIVE NEGATIVE Final    Comment: (NOTE) The Xpert SA Assay (FDA approved for NASAL specimens in patients 54 years of age and older), is one component of a comprehensive surveillance program. It is not intended to diagnose infection nor to guide or monitor treatment. Performed at [facility], [address] [place], [place] [address]           Radiology Studies: No results found.      Scheduled Meds:  [MAR Hold] (feeding supplement) PROSource Plus  30 mL Oral QID   [MAR Hold] acetaminophen  500 mg Oral QID   [MAR Hold] vitamin C  500 mg Oral BID   [MAR Hold] buPROPion  150 mg Oral BID   chlorhexidine  60 mL Topical Once   [MAR Hold] Chlorhexidine Gluconate Cloth  6 each Topical Daily   [MAR Hold] enoxaparin (LOVENOX) injection  40 mg Subcutaneous Q24H   [MAR Hold] feeding supplement  237 mL Oral BID BM   [MAR Hold] ferrous sulfate  325 mg Oral BID WC   [MAR Hold] gabapentin  200 mg Oral BID   [MAR Hold] HYDROmorphone   Intravenous Q4H   [MAR Hold] insulin aspart  0-15 Units Subcutaneous TID WC   [MAR Hold] insulin aspart  0-5 Units Subcutaneous QHS   [MAR Hold] insulin glargine  25 Units Subcutaneous Daily   [MAR Hold] mouth rinse  15 mL Mouth Rinse BID   [MAR Hold] nutrition supplement  (JUVEN)  1 packet Oral BID BM   [MAR Hold] polyethylene glycol  17 g Oral Daily   [MAR Hold] senna-docusate  1 tablet Oral BID   [MAR Hold] sodium chloride flush  5 mL Intracatheter Q8H   [MAR Hold] zinc sulfate  220 mg Oral  Daily   Continuous Infusions:  [MAR Hold] sodium chloride 250 mL (03/06/21 0417)   [MAR Hold] ceFEPime (MAXIPIME) IV 2 g (03/12/21 0604)   phenylephrine       LOS: 40 days   Time spent= 31 mins  Joseph Art, DO [facility]  If 7PM-7AM, please contact night-coverage  03/12/2021, 1:16 PM

## 2021-03-13 ENCOUNTER — Encounter (HOSPITAL_COMMUNITY): Payer: Self-pay | Admitting: Orthopedic Surgery

## 2021-03-13 ENCOUNTER — Inpatient Hospital Stay: Payer: Self-pay

## 2021-03-13 LAB — CBC WITH DIFFERENTIAL/PLATELET
Abs Immature Granulocytes: 0.06 10*3/uL (ref 0.00–0.07)
Basophils Absolute: 0 10*3/uL (ref 0.0–0.1)
Basophils Relative: 0 %
Eosinophils Absolute: 0.3 10*3/uL (ref 0.0–0.5)
Eosinophils Relative: 3 %
HCT: 27.4 % — ABNORMAL LOW (ref 39.0–52.0)
Hemoglobin: 8.5 g/dL — ABNORMAL LOW (ref 13.0–17.0)
Immature Granulocytes: 1 %
Lymphocytes Relative: 13 %
Lymphs Abs: 1 10*3/uL (ref 0.7–4.0)
MCH: 28.1 pg (ref 26.0–34.0)
MCHC: 31 g/dL (ref 30.0–36.0)
MCV: 90.7 fL (ref 80.0–100.0)
Monocytes Absolute: 0.4 10*3/uL (ref 0.1–1.0)
Monocytes Relative: 5 %
Neutro Abs: 6.2 10*3/uL (ref 1.7–7.7)
Neutrophils Relative %: 78 %
Platelets: 555 10*3/uL — ABNORMAL HIGH (ref 150–400)
RBC: 3.02 MIL/uL — ABNORMAL LOW (ref 4.22–5.81)
RDW: 16.2 % — ABNORMAL HIGH (ref 11.5–15.5)
WBC: 7.9 10*3/uL (ref 4.0–10.5)
nRBC: 0 % (ref 0.0–0.2)

## 2021-03-13 LAB — GLUCOSE, CAPILLARY
Glucose-Capillary: 285 mg/dL — ABNORMAL HIGH (ref 70–99)
Glucose-Capillary: 132 mg/dL — ABNORMAL HIGH (ref 70–99)
Glucose-Capillary: 224 mg/dL — ABNORMAL HIGH (ref 70–99)
Glucose-Capillary: 141 mg/dL — ABNORMAL HIGH (ref 70–99)

## 2021-03-13 LAB — VITAMIN A: Vitamin A (Retinoic Acid): 31.8 ug/dL (ref 20.1–62.0)

## 2021-03-13 MED ORDER — SODIUM CHLORIDE 0.9% FLUSH: 10.0000 mL | INTRAVENOUS | Status: DC | PRN

## 2021-03-13 MED ORDER — SODIUM CHLORIDE 0.9% FLUSH
10.0000 mL | Freq: Two times a day (BID) | INTRAVENOUS | Status: DC
  Administered 2021-03-14: 10 mL

## 2021-03-13 NOTE — Progress Notes (Signed)
PROGRESS NOTE    John Ware  ZYS:[id number] DOB: 1966-02-23 DOA: 01/31/2021 PCP: Junie Spencer, FNP   Brief Narrative:  55 year old with history of chronic pain, HTN, DM2, COVID-positive at home 5/20 reporting of body pain, shoulder pain eventually came to the ER.  Found to have MSSA bacteremia started on IV Ancef.  Large fluid collection on the left lateral posterior chest and back was noted.  Underwent MRI T-spine and CT CAP showing large multiloculated effusion  and osteomyelitis of thoracic spine.  There was concerns for septic emboli:  TEE performed on 6/13 showed large tricuspid vegetation.  General surgery and orthopedic consulted.  Underwent left shoulder debridement 02/08/2021.  Postop had increasing size of his abscess collection therefore general surgery performed repeat debridement with wound VAC placement.   CT maxillofacial showed oral lucencies therefore oral surgery consulted.  Oral surgery did not think this was acute and Low suspicion for source per ID.  Underwent repeat I&D by Ortho on 02/28/2021.  Culture showing Pseudomonas.  Antibiotics switched to cefepime on 03/04/2020.  Patient underwent another irrigation and debridement of the left shoulder on 03/06/2021 followed by another left shoulder irrigation and debridement with wound VAC placement on 03/08/2021. Back to the OR on 7/18 for repeat I&D.     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  -Large left posterior wall and  bilateral psoas muscle abscess, bilateral septic shoulder joint, osteomyelitis and discitis.  Also possible floaters but endopthalmitis was ruled out. - Left shoulder debridement 6/16, right shoulder debridement 6/17, drain placement by IR 6/24.  Status post bilateral shoulder debridement with wound VAC placement 6/29.   - PCA for pain control. Oxycodone 10mg  q4hrs prn,. -Repeat CT shoulder performed.  Status post repeat I&D 7/6, cultures growing Pseudomonas.  Antibiotics transitioned to cefepime on 03/07/2021.  Patient underwent another irrigation and debridement of the left shoulder on 03/06/2021.  Had wound VAC on the left shoulder and Hemovac on bilateral shoulders.  Underwent repeat MRI left shoulder 03/07/2021 which shows continued septic arthritis and acute osteomyelitis.  Full-thickness retracted tears of the supraspinatus and infraspinatus tendons.  Intramuscular edema was controlled within the supraspinatus, infraspinatus and anterior deltoid muscles.  Likely myositis.   -He underwent another left shoulder irrigation and debridement with wound VAC placement on 03/08/2021 however both Hemovacs and bilateral shoulders were removed.  He is scheduled for another procedure on Monday, 03/12/2021.  ID recommends continuing cefepime for total of 4 more weeks starting 7/14.  Will need PICC line and removal of left IJ - planned for 7/19 ID: continue on cefepime for 4 wk then we will see back in the ID clinic for conversion to oral abtx suppression given burden of disease  Essential hypertension: Takes amlodipine and lisinopril at home.  Both on hold.  Blood pressure controlled.  Poor dentition with dental lucencies per CT scan. Oral surgery, Dr 8/19 didn't think there is anything acute. Low suspicious for potential source  for ID.  Follow-up outpatient dentist  Anemia of chronic disease.   -Oral iron supplements with bowel regimen.  Status post 2 units PRBC 6/30.  Hemoglobin is stable over 7.  Closely  monitor.   DKA.  Resolved.  Type 2 diabetes mellitus.  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   Blood sugar remains labile but fairly controlled now.  We will continue Lantus 25 units and SSI.    Ascending aorta dilatation (HCC) - TTE on 6/10 notes mild dilation of ascending aorta, 38 mm - TEE shows normal thoracic and ascending aorta noted on 02/05/21   Anxiety Continue Wellbutrin and continue to hold Zoloft.  Monitor sodium.   COVID-19 infection-resolved  Chronic hyponatremia: At his baseline.  DVT prophylaxis: Lovenox Code Status: Full Family Communication:  at bedside.  Status is: Inpatient  Remains inpatient appropriate because:Inpatient level of care appropriate due to severity of illness.     Dispo: The patient is from: Home              Anticipated d/c is to: home health?              Patient currently is not medically stable to d/c.   Difficult to place patient No    Pressure Injury 01/31/21 Buttocks Right;Left;Posterior Stage 2 -  Partial thickness loss of dermis presenting as a shallow open injury with a red, pink wound bed without slough. (Active)  01/31/21 1546  Location: Buttocks  Location Orientation: Right;Left;Posterior  Staging: Stage 2 -  Partial thickness loss of dermis presenting as a shallow open injury with a red, pink wound bed without slough.  Wound Description (Comments):   Present on Admission: Yes      Subjective: Doing well, no nausea  Examination:    General: Appearance:     Overweight male in no acute distress     Lungs:     respirations unlabored  Heart:    Normal heart rate. Normal rhythm. No murmurs, rubs, or gallops.    MS:   All extremities are intact.  Dressing on left shoulder   Neurologic:   Awake, alert      Objective: Vitals:   03/12/21 2145 03/13/21 0556 03/13/21 0801 03/13/21 1153  BP: 105/69 109/70  113/77  Pulse: 73 75  71  Resp: 18 17 16 16    Temp: (!) 97.4 F (36.3 C) 98.5 F (36.9 C)  97.7 F (36.5 C)  TempSrc: Oral     SpO2: 100% 100% 100% 98%  Weight:      Height:        Intake/Output Summary (Last 24 hours) at 03/13/2021 1235 Last data filed at 03/13/2021 1100 Gross per 24 hour  Intake 1080 ml  Output 3320 ml  Net -2240 ml   Filed Weights   02/08/21 1540 02/20/21 1419 02/27/21 1443  Weight: 97.3 kg 97.3 kg 93 kg     Data Reviewed:   CBC: Recent Labs  Lab 03/09/21 0359 03/10/21 0328 03/11/21 0255 03/12/21 0320 03/13/21 0335  WBC 7.3 10.7* 11.4* 12.1* 7.9  NEUTROABS 6.0 7.1 7.5 8.4* 6.2  HGB 7.4* 7.4* 8.0* 8.4* 8.5*  HCT 23.6* 23.9* 25.3* 27.0* 27.4*  MCV 91.1 92.3 90.4 90.9 90.7  PLT 482* 503* 521* 565* 555*   Basic Metabolic Panel: Recent Labs  Lab 03/09/21 0359 03/12/21 0320  NA 132* 132*  K 4.3 4.0  CL 95* 96*  CO2 29 27  GLUCOSE 327* 135*  BUN 18  21*  CREATININE 0.46* 0.41*  CALCIUM 8.3* 8.6*   GFR: Estimated Creatinine Clearance: 122.3 mL/min (A) (by C-G formula based on SCr of 0.41 mg/dL (L)). Liver Function Tests: No results for input(s): AST, ALT, ALKPHOS, BILITOT, PROT, ALBUMIN in the last 168 hours. No results for input(s): LIPASE, AMYLASE in the last 168 hours. No results for input(s): AMMONIA in the last 168 hours. Coagulation Profile: No results for input(s): INR, PROTIME in the last 168 hours. Cardiac Enzymes: No results for input(s): CKTOTAL, CKMB, CKMBINDEX, TROPONINI in the last 168 hours. BNP (last 3 results) No results for input(s): PROBNP in the last 8760 hours. HbA1C: No results for input(s): HGBA1C in the last 72 hours. CBG: Recent Labs  Lab 03/12/21 1424 03/12/21 1544 03/12/21 2148 03/13/21 0742 03/13/21 1154  GLUCAP 96 128* 361* 285* 132*   Lipid Profile: No results for input(s): CHOL, HDL, LDLCALC, TRIG, CHOLHDL, LDLDIRECT in the last 72 hours. Thyroid Function Tests: No results for input(s): TSH, T4TOTAL, FREET4, T3FREE, THYROIDAB in the last 72  hours. Anemia Panel: No results for input(s): VITAMINB12, FOLATE, FERRITIN, TIBC, IRON, RETICCTPCT in the last 72 hours.  Sepsis Labs: No results for input(s): PROCALCITON, LATICACIDVEN in the last 168 hours.  Recent Results (from the past 240 hour(s))  Surgical pcr screen     Status: None   Collection Time: 03/06/21  3:07 AM   Specimen: Nasal Mucosa; Nasal Swab  Result Value Ref Range Status   MRSA, PCR NEGATIVE NEGATIVE Final   Staphylococcus aureus NEGATIVE NEGATIVE Final    Comment: (NOTE) The Xpert SA Assay (FDA approved for NASAL specimens in patients 45 years of age and older), is one component of a comprehensive surveillance program. It is not intended to diagnose infection nor to guide or monitor treatment. Performed at [facility], [address] [place], [place] [address]           Radiology Studies: [place] EKG SITE RITE  Result Date: 03/13/2021 If Site Rite image not attached, placement could not be confirmed due to current cardiac rhythm.       Scheduled Meds:  (feeding supplement) PROSource Plus  30 mL Oral QID   acetaminophen  500 mg Oral QID   vitamin C  500 mg Oral BID   buPROPion  150 mg Oral BID   Chlorhexidine Gluconate Cloth  6 each Topical Daily   enoxaparin (LOVENOX) injection  40 mg Subcutaneous Q24H   feeding supplement  237 mL Oral BID BM   ferrous sulfate  325 mg Oral BID WC   gabapentin  200 mg Oral BID   HYDROmorphone   Intravenous Q4H   insulin aspart  0-15 Units Subcutaneous TID WC   insulin aspart  0-5 Units Subcutaneous QHS   insulin glargine  25 Units Subcutaneous Daily   mouth rinse  15 mL Mouth Rinse BID   nutrition supplement (JUVEN)  1 packet Oral BID BM   polyethylene glycol  17 g Oral Daily   senna-docusate  1 tablet Oral BID   sodium chloride flush  5 mL Intracatheter Q8H   zinc sulfate  220 mg Oral Daily   Continuous Infusions:  sodium chloride 250 mL (03/06/21 0417)   ceFEPime (MAXIPIME) IV 2 g  (03/13/21 0511)     LOS: 41 days   Time spent= 31 mins  Joseph Art, DO [facility]  If 7PM-7AM, please contact night-coverage  03/13/2021, 12:35 PM

## 2021-03-13 NOTE — Progress Notes (Signed)
    OVERNIGHT PROGRESS REPORT  Notified by RN for Hemovac not working properly (loss of suction) and one of the suction lines (accidentally) dislodged from the patient surgical site.  One of the drain lines is found to be out of the patient. There are obvious side  (drain) holes visible in the dislodged tubing and the end of the tubing appears to be smooth. There does not appear to be an obvious site or any skin damage to indicate where the drain entry may have been. Patient has no stated pain and is sitting up eating at this time. The second drain appears to be entering the surgical site.  The dislodged end was cleaned with alcohol and occlusive dressing was placed over the drain holes of the dislodged drain line and compression of the hemovac was successful at this time.   Nursing staff is replacing the damaged dressing and will notify surgery of the (Dislodged) drain status    Chinita Greenland MSNA ACNPC-AG Acute Care Nurse Practitioner [facility] [place]

## 2021-03-13 NOTE — Progress Notes (Signed)
Physical Therapy Treatment and Discharge from Acute PT Patient Details Name: John Ware MRN: [id number] DOB: 07-18-1966 Today's Date: 03/13/2021    History of Present Illness 55 year old with history of chronic pain, HTN, DM2, COVID-positive at home 5/20 reporting of body pain, shoulder pain eventually came to the ER.  Found to have MSSA bacteremia started on IV Ancef.  Large fluid collection on the left lateral posterior chest and back was noted.  Underwent MRI T-spine and CT CAP showing large multiloculated effusion  and osteomyelitis of thoracic spine.  There was concerns for septic emboli but echo was negative for TEE performed on 6/13 showed large tricuspid vegetation.  General surgery and orthopedic consulted.  Underwent left shoulder debridement 02/08/2021.  Postop had increasing size of his abscess collection therefore general surgery performed repeat debridement with wound VAC placement.  Dressing management per surgical team.  CT maxillofacial showed oral lucencies therefore oral surgery consulted.  Oral surgery did not think this was acute and Low suspicion for source per ID.  Underwent repeat I&D by Ortho on 02/28/2021. patient had repeat I &D on left shoulder 7/12 and 7/14.  Back to the OR on 7/18 for repeat I&D.    PT Comments    Pt able to mobilize around in room with supervision for lines/drain.  Pt also ambulated in hallway pushing IV pole.  Pt currently supervision to modified independent with mobility and could benefit from [facility] at home if agreeable.  Pt will need more therapy for shoulders upon d/c so continue to recommend HHPT.  Since pt mobilizing well and has met acute PT goals, PT to sign off at this time.  Pt would benefit from continuing to ambulate with nursing staff during remainder of hospital stay.   Follow Up Recommendations  Home health PT     Equipment Recommendations  Cane    Recommendations for Other Services       Precautions / Restrictions  Precautions Precautions: Fall Precaution Comments: pt has been WBAT B UE's with slings for "comfort" but has not needed them.  Per ortho note 7/19, WBAT BUE; Left shoulder hemovac Restrictions Weight Bearing Restrictions: No    Mobility  Bed Mobility Overal bed mobility: Modified Independent                  Transfers Overall transfer level: Needs assistance Equipment used: None Transfers: Sit to/from Stand Sit to Stand: Supervision         General transfer comment: supervision for safety with lines/drains  Ambulation/Gait Ambulation/Gait assistance: Supervision Gait Distance (Feet): 500 Feet Assistive device: IV Pole Gait Pattern/deviations: Step-through pattern;Decreased stride length     General Gait Details: pt steady and pushed IV pole   Stairs             Wheelchair Mobility    Modified Rankin (Stroke Patients Only)       Balance           Standing balance support: During functional activity Standing balance-[name]: Fair                              Cognition Arousal/Alertness: Awake/alert Behavior During Therapy: WFL for tasks assessed/performed Overall Cognitive Status: Within Functional Limits for tasks assessed  Exercises      General Comments        Pertinent Vitals/Pain Pain Assessment: Faces Faces Pain Scale: Hurts little more Pain Location: Lt shoulder Pain Descriptors / Indicators: Sore Pain Intervention(s): Repositioned;Monitored during session;Premedicated before session;PCA encouraged    Home Living                      Prior Function            PT Goals (current goals can now be found in the care plan section) Progress towards PT goals: Goals met/education completed, patient discharged from PT    Frequency    Min 3X/week      PT Plan Current plan remains appropriate    Co-evaluation              AM-PAC PT "6  Clicks" Mobility   Outcome Measure  Help needed turning from your back to your side while in a flat bed without using bedrails?: A Little Help needed moving from lying on your back to sitting on the side of a flat bed without using bedrails?: A Little Help needed moving to and from a bed to a chair (including a wheelchair)?: A Little Help needed standing up from a chair using your arms (e.g., wheelchair or bedside chair)?: A Little Help needed to walk in hospital room?: A Little Help needed climbing 3-5 steps with a railing? : A Little 6 Click Score: 18    End of Session   Activity Tolerance: Patient tolerated treatment well Patient left: in bed;with call bell/phone within reach;with family/visitor present Nurse Communication: Mobility status PT Visit Diagnosis: Difficulty in walking, not elsewhere classified (R26.2)     Time: 1449-1510 PT Time Calculation (min) (ACUTE ONLY): 21 min  Charges:  $Gait Training: 8-22 mins                     Arlyce Dice, DPT Acute Rehabilitation Services Pager: [phone] Office: [phone]    York Ram E 03/13/2021, 3:38 PM

## 2021-03-13 NOTE — Progress Notes (Signed)
Subjective: 1 Day Post-Op s/p Procedure(s): INCISION AND DRAINAGE ABSCESS   Patient is alert, oriented, standing at side of bed due to back pain. Bilateral shoulder pain has improved, Denies chest pain, SOB, Calf pain. No nausea/vomiting. No other complaints.    Objective:  PE: VITALS:   Vitals:   03/12/21 1522 03/12/21 2145 03/13/21 0556 03/13/21 0801  BP: 114/73 105/69 109/70   Pulse: 79 73 75   Resp: 16 18 17 16   Temp: 98.2 F (36.8 C) (!) 97.4 F (36.3 C) 98.5 F (36.9 C)   TempSrc: Axillary Oral    SpO2: 96% 100% 100% 100%  Weight:      Height:         0-70 deg FF at right shoulder. 5 deg FF left shoulder. Axillary nerve sensation and deltoid firing intact bilaterally. Full ROM of bilateral elbows, wrist, and all fingers of hands. R shoulder incision healing well, removed most of the sutures today. L shoulder wound well opposed with small amount of bloody drainage, aproxx 10 cc's bloody drainage in hemovac. Distal sensation and capillary refill intact. 5/5 grip strength bilaterally.    LABS  Results for orders placed or performed during the hospital encounter of 01/31/21 (from the past 24 hour(s))  Glucose, capillary     Status: None   Collection Time: 03/12/21 12:04 PM  Result Value Ref Range   Glucose-Capillary 92 70 - 99 mg/dL  Type and screen [facility] Pre op     Status: None   Collection Time: 03/12/21 12:50 PM  Result Value Ref Range   ABO/RH(D) B POS    Antibody Screen NEG    Sample Expiration      03/15/2021,2359 Performed at [facility], [address], [place], [place]   Glucose, capillary     Status: None   Collection Time: 03/12/21  2:24 PM  Result Value Ref Range   Glucose-Capillary 96 70 - 99 mg/dL  Glucose, capillary     Status: Abnormal   Collection Time: 03/12/21  3:44 PM  Result Value Ref Range   Glucose-Capillary 128 (H) 70 - 99 mg/dL  Glucose, capillary     Status: Abnormal    Collection Time: 03/12/21  9:48 PM  Result Value Ref Range   Glucose-Capillary 361 (H) 70 - 99 mg/dL   Comment 1 Notify RN    Comment 2 Document in Chart   CBC with Differential/Platelet     Status: Abnormal   Collection Time: 03/13/21  3:35 AM  Result Value Ref Range   WBC 7.9 4.0 - 10.5 K/uL   RBC 3.02 (L) 4.22 - 5.81 MIL/uL   Hemoglobin 8.5 (L) 13.0 - 17.0 g/dL   HCT 03/15/21 (L) 76.5 - 46.5 %   MCV 90.7 80.0 - 100.0 fL   MCH 28.1 26.0 - 34.0 pg   MCHC 31.0 30.0 - 36.0 g/dL   RDW 03.5 (H) 46.5 - 68.1 %   Platelets 555 (H) 150 - 400 K/uL   nRBC 0.0 0.0 - 0.2 %   Neutrophils Relative % 78 %   Neutro Abs 6.2 1.7 - 7.7 K/uL   Lymphocytes Relative 13 %   Lymphs Abs 1.0 0.7 - 4.0 K/uL   Monocytes Relative 5 %   Monocytes Absolute 0.4 0.1 - 1.0 K/uL   Eosinophils Relative 3 %   Eosinophils Absolute 0.3 0.0 - 0.5 K/uL   Basophils Relative 0 %   Basophils Absolute 0.0 0.0 -  0.1 K/uL   Immature Granulocytes 1 %   Abs Immature Granulocytes 0.06 0.00 - 0.07 K/uL  Glucose, capillary     Status: Abnormal   Collection Time: 03/13/21  7:42 AM  Result Value Ref Range   Glucose-Capillary 285 (H) 70 - 99 mg/dL    [place] EKG SITE RITE  Result Date: 03/13/2021 If Site Rite image not attached, placement could not be confirmed due to current cardiac rhythm.   Assessment/Plan: Bilateral shoulder septic arthritis - L shoulder irrigation and debridement 6/16 - R shoulder irrigation and debridement with distal clavicle resection 6/17 - bilateral shoulder irrigation and debridement with wound vac placement bilaterally 6/29 - bilateral shoulder irrigation and debridement with closure and incisional wound vac placement, hemovac placement R shoulder, L wound vac and hemovac placement 7/7 - L shoulder irrigation and debridement with hemovac placement and wound vac placement 7/12 - L shoulder irrigation and debridement with wound vac placement, removed hemovac of bilateral shoulders 7/14 - L shoulder  irrigation and debridement with hemovac placement and closure of wound 7/19  - continue daily dressing changes of right shoulder dressings, can use mepilex or just bulky dressing of 4x4's, abds, and tape. Bulky dressing on left shoulder - ok for removal of central line and PICC line placement from ortho perspective - WBAT BUE - will continue to watch hemovac drainage, no plans to remove at this time  Contact information:   Weekdays 8-5 Janine Ores, PA-C [phone] A fter hours and holidays please check [email or web address] for group call information for [facility]  Armida Sans 03/13/2021, 10:46 AM

## 2021-03-13 NOTE — Progress Notes (Signed)
Peripherally Inserted Central Catheter Placement  The IV Nurse has discussed with the patient and/or persons authorized to consent for the patient, the purpose of this procedure and the potential benefits and risks involved with this procedure.  The benefits include less needle sticks, lab draws from the catheter, and the patient may be discharged home with the catheter. Risks include, but not limited to, infection, bleeding, blood clot (thrombus formation), and puncture of an artery; nerve damage and irregular heartbeat and possibility to perform a PICC exchange if needed/ordered by physician.  Alternatives to this procedure were also discussed.  Bard Power PICC patient education guide, fact sheet on infection prevention and patient information card has been provided to patient /or left at bedside.    PICC Placement Documentation  PICC Single Lumen 03/13/21 Right Brachial 41 cm 1 cm (Active)  Indication for Insertion or Continuance of Line Home intravenous therapies (PICC only) 03/13/21 1652  Exposed Catheter (cm) 0 cm 03/13/21 1652  Site Assessment Clean;Dry;Intact 03/13/21 1652  Line Status Flushed;Saline locked;Blood return noted 03/13/21 1652  Dressing Type Transparent;Securing device 03/13/21 1652  Dressing Status Clean;Dry;Intact 03/13/21 1652  Antimicrobial disc in place? Yes 03/13/21 1652  Safety Lock Not Applicable 03/13/21 1652  Line Care Connections checked and tightened 03/13/21 1652  Line Adjustment (NICU/IV Team Only) No 03/13/21 1652  Dressing Intervention New dressing 03/13/21 1652  Dressing Change Due 03/20/21 03/13/21 1652       John Ware 03/13/2021, 4:53 PM

## 2021-03-14 DIAGNOSIS — D649 Anemia, unspecified: Secondary | ICD-10-CM

## 2021-03-14 DIAGNOSIS — Z8616 Personal history of COVID-19: Secondary | ICD-10-CM

## 2021-03-14 DIAGNOSIS — K6812 Psoas muscle abscess: Secondary | ICD-10-CM

## 2021-03-14 DIAGNOSIS — M25511 Pain in right shoulder: Secondary | ICD-10-CM

## 2021-03-14 DIAGNOSIS — L0291 Cutaneous abscess, unspecified: Secondary | ICD-10-CM

## 2021-03-14 LAB — CBC WITH DIFFERENTIAL/PLATELET
Abs Immature Granulocytes: 0.08 10*3/uL — ABNORMAL HIGH (ref 0.00–0.07)
Basophils Absolute: 0 10*3/uL (ref 0.0–0.1)
Basophils Relative: 0 %
Eosinophils Absolute: 0.2 10*3/uL (ref 0.0–0.5)
Eosinophils Relative: 2 %
HCT: 25 % — ABNORMAL LOW (ref 39.0–52.0)
Hemoglobin: 7.9 g/dL — ABNORMAL LOW (ref 13.0–17.0)
Immature Granulocytes: 1 %
Lymphocytes Relative: 20 %
Lymphs Abs: 2.6 10*3/uL (ref 0.7–4.0)
MCH: 28.9 pg (ref 26.0–34.0)
MCHC: 31.6 g/dL (ref 30.0–36.0)
MCV: 91.6 fL (ref 80.0–100.0)
Monocytes Absolute: 1 10*3/uL (ref 0.1–1.0)
Monocytes Relative: 8 %
Neutro Abs: 9.2 10*3/uL — ABNORMAL HIGH (ref 1.7–7.7)
Neutrophils Relative %: 69 %
Platelets: 512 10*3/uL — ABNORMAL HIGH (ref 150–400)
RBC: 2.73 MIL/uL — ABNORMAL LOW (ref 4.22–5.81)
RDW: 16.1 % — ABNORMAL HIGH (ref 11.5–15.5)
WBC: 13.1 10*3/uL — ABNORMAL HIGH (ref 4.0–10.5)
nRBC: 0 % (ref 0.0–0.2)

## 2021-03-14 LAB — GLUCOSE, CAPILLARY
Glucose-Capillary: 103 mg/dL — ABNORMAL HIGH (ref 70–99)
Glucose-Capillary: 130 mg/dL — ABNORMAL HIGH (ref 70–99)
Glucose-Capillary: 129 mg/dL — ABNORMAL HIGH (ref 70–99)
Glucose-Capillary: 173 mg/dL — ABNORMAL HIGH (ref 70–99)

## 2021-03-14 MED ORDER — OXYCODONE ER 18 MG PO C12A: 1.0000 | EXTENDED_RELEASE_CAPSULE | Freq: Two times a day (BID) | ORAL | Status: DC

## 2021-03-14 MED ORDER — OXYCODONE HCL ER 20 MG PO T12A
20.0000 mg | EXTENDED_RELEASE_TABLET | Freq: Two times a day (BID) | ORAL | Status: DC
  Administered 2021-03-14 – 2021-03-15 (×3): 20 mg via ORAL
  Filled 2021-03-14 (×3): qty 1

## 2021-03-14 NOTE — Progress Notes (Signed)
Hemovac removed and new bulky dressing was placed. Ok to discharge from ortho perspective when home IV antibiotics set up and cleared by medicine.   Janine Ores, PA-C

## 2021-03-14 NOTE — Anesthesia Postprocedure Evaluation (Signed)
Anesthesia Post Note  Patient: John Ware  Procedure(s) Performed: INCISION AND DRAINAGE ABSCESS (Left)     Patient location during evaluation: PACU Anesthesia Type: General Level of consciousness: awake and alert Pain management: pain level controlled Vital Signs Assessment: post-procedure vital signs reviewed and stable Respiratory status: spontaneous breathing, nonlabored ventilation, respiratory function stable and patient connected to nasal cannula oxygen Cardiovascular status: blood pressure returned to baseline and stable Postop Assessment: no apparent nausea or vomiting Anesthetic complications: no   No notable events documented.  Last Vitals:  Vitals:   03/14/21 0450 03/14/21 0745  BP: 131/84   Pulse: 77   Resp: 18   Temp: 36.7 C   SpO2: 100% 96%    Last Pain:  Vitals:   03/14/21 0745  TempSrc:   PainSc: 7                  Ezel Vallone S

## 2021-03-14 NOTE — Progress Notes (Signed)
PROGRESS NOTE    John Ware  XNT:[id number] DOB: 27-Feb-1966 DOA: 01/31/2021 PCP: Junie Spencer, FNP    Chief Complaint  Patient presents with   Generalized Body Aches    Brief Narrative: 55 year old with history of chronic pain, HTN, DM2, COVID-positive at home 5/20 reporting of body pain, shoulder pain eventually came to the ER.  Found to have MSSA bacteremia started on IV Ancef.  Large fluid collection on the left lateral posterior chest and back was noted.  Underwent MRI T-spine and CT CAP showing large multiloculated effusion  and osteomyelitis of thoracic spine.  There was concerns for septic emboli:  TEE performed on 6/13 showed large tricuspid vegetation.  General surgery and orthopedic consulted.  Underwent left shoulder debridement 02/08/2021.  Postop had increasing size of his abscess collection therefore general surgery performed repeat debridement with wound VAC placement.   CT maxillofacial showed oral lucencies therefore oral surgery consulted.  Oral surgery did not think this was acute and Low suspicion for source per ID.  Underwent repeat I&D by Ortho on 02/28/2021.  Culture showing Pseudomonas.  Antibiotics switched to cefepime on 03/04/2020.  Patient underwent another irrigation and debridement of the left shoulder on 03/06/2021 followed by another left shoulder irrigation and debridement with wound VAC placement on 03/08/2021. Back to the OR on 7/18 for repeat I&D.     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 -Large left posterior wall and bilateral psoas muscle abscess, bilateral septic shoulder joint, osteomyelitis and discitis. -Patient noted to have floaters was seen by ophthalmology and endophthalmitis ruled out. -Patient underwent left shoulder debridement 6/16, right shoulder debridement 6/17, drain placement by IR 6/24, status post bilateral shoulder debridement with wound VAC placement 6/29. -Currently on Dilaudid PCA for pain control as well as oxycodone 10 mg every 4 hours as needed breakthrough pain. -Repeat CT shoulder performed. -Status post I&D 7/6 with cultures growing Pseudomonas. -Antibiotics changed to cefepime on 03/07/2021. -Patient underwent another irrigation and debridement of the left shoulder on 03/06/2021. -Patient noted to have had wound VAC on left shoulder and Hemovac on bilateral shoulders. -Repeat MRI left shoulder 03/07/2021 with continued septic arthritis and acute osteomyelitis, full-thickness retracted tears of supraspinatus and infraspinatus tendons.  Intramuscular edema was controlled within the supraspinatus, infraspinatus and anterior deltoid muscle, likely myositis. -Patient underwent another left shoulder irrigation debridement with wound VAC placement on 03/08/2021, both Hemovacs and bilateral shoulders were removed. -Patient underwent left shoulder excisional debridement, glenohumeral joint, deep abscess, subacromial space per Dr Dion Saucier 03/12/2021. -Patient seen in consultation by ID who recommended cefepime for total of 4 more weeks with starting date 03/08/2021. -Patient underwent PICC line placement and removal of left IJ on 03/13/2021. -We will need outpatient follow-up with ID after IV antibiotics are done to convert to oral antibiotic suppression due to significant burden of disease. -Discontinue Dilaudid PCA pump and placed back on home regimen of long-acting  OxyContin.  2.  Hypertension -Blood pressure currently stable. -Continue to hold amlodipine and lisinopril.  3.  Poor dentition with dental lucencies per CT maxillofacial -Oral surgery, Dr. Ross Marcus did not think patient had any acute issues ongoing and low suspicion for potential source for ID. -We will need outpatient follow-up with dentistry.  4.  Anemia of chronic disease Status posttransfusion 3 units total packed red blood cells during this hospitalization with hemoglobin currently stable at 7.9. -Follow H&H.  5.  DKA -Resolved.  6.  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 -Hemoglobin A1c 10.2 01/31/2021. -CBG 103 this morning. -Continue current regimen of Lantus 25 units daily, SSI.  7.  Ascending aortic dilatation -2D echo 6/10 with mild dilatation of ascending aorta, 38 mm -TEE with normal thoracic and ascending aorta noted on 02/05/2021.  8.  Anxiety -Zoloft on hold. -Continue Wellbutrin.  9.  COVID-19 infection -Resolved.  10.  Chronic hyponatremia -Stable.  At baseline.  11.  Pressure injury, POA Pressure Injury 01/31/21 Buttocks Right;Left;Posterior Stage 2 -  Partial thickness loss of dermis presenting as a shallow open injury with a red, pink wound bed without slough. (Active)  01/31/21 1546  Location: Buttocks  Location Orientation: Right;Left;Posterior  Staging: Stage 2 -  Partial thickness loss of dermis presenting as a shallow open injury with a red, pink wound bed without slough.  Wound Description (Comments):   Present on Admission: Yes          DVT prophylaxis: Lovenox Code Status: Full Family Communication: Updated patient, wife, daughter at bedside Disposition:   Status is: Inpatient  Remains inpatient appropriate because:IV treatments appropriate due to intensity of illness or inability to take PO  Dispo: The patient is from: Home              Anticipated d/c is to: Home               Patient currently is not medically stable to d/c.   Difficult to place patient No       Consultants:  Wound care Infectious disease: Dr. Drue Second 02/01/2021 Interventional radiology: Dr. Fredia Sorrow 02/02/2021 Ophthalmology: Dr. Genia Del 02/05/2021 Orthopedics: 03/12/2021.  02/07/2021 General surgery: Dr. Magnus Ivan 02/09/2021 Interventional radiology: Dr. Grace Isaac 02/15/2021    Procedures: CT chest/abdomen/pelvis 02/01/2021 CT chest without contrast 02/09/2021, 02/13/2021, 02/18/2022 CT left shoulder 02/09/2021, 02/13/2021, 02/18/2021 CT right shoulder 02/13/2021, 02/18/2021 CT abdomen and pelvis 02/23/2019 through Ultrasound-guided placement of 10 French all-purpose drain catheter into left subclavicular abscess with aspiration of 40 cc of bloody fluid per IR, Dr. Grace Isaac 02/15/2021 CT-guided placement of 10 French all-purpose drain catheter into dominant right-sided retroperitoneal abscess with aspiration of 35 cc of purulent fluid per IR, Dr. Grace Isaac 02/15/2021 CT-guided placement of 10 French percutaneous drain catheter into dominant left sided retroperitoneal abscess with aspiration of approximately 5 cc of bloody fluid per IR, Dr. Grace Isaac 02/16/2021 2D echo 02/03/2021 TEE 02/05/2021 MRI C-spine 02/15/2021 MRI L-spine 02/15/2021 MRI T-spine 02/15/2021 MRI right shoulder 02/07/2021 MRI left shoulder 02/07/2021, 03/07/2021 CT maxillofacial 02/26/2021 Left shoulder irrigation and debridement 02/08/2021 Right shoulder irrigation and debridement with distal clavicle resection 02/09/2021 - bilateral shoulder irrigation and debridement with wound vac placement bilaterally 6/29 - bilateral shoulder irrigation and debridement with closure and incisional wound vac placement, hemovac placement R shoulder, L wound vac and hemovac placement 7/7 - L shoulder irrigation and debridement with hemovac placement and wound vac placement 7/12 - L shoulder irrigation and debridement with wound vac placement, removed hemovac  of  bilateral shoulders 7/14 - L shoulder irrigation and debridement with hemovac placement and closure of wound 7/19 PICC line placement 03/13/2021 Transfusion 2 units packed red blood cells 02/22/2021 Transfusion 1 unit packed red blood cells 02/16/2021   Antimicrobials:  Anti-infectives (From admission, onward)    Start     Dose/Rate Route Frequency Ordered Stop   03/04/21 1900  ceFEPIme (MAXIPIME) 2 g in sodium chloride 0.9 % 100 mL IVPB        2 g 200 mL/hr over 30 Minutes Intravenous Every 8 hours 03/04/21 1855 04/05/21 2359   03/03/21 0000  ceFAZolin (ANCEF) 2 g in sodium chloride 0.9 % 100 mL IVPB  Status:  Discontinued        2 g 200 mL/hr over 30 Minutes Intravenous Every 8 hours 03/02/21 2004 03/04/21 1855   03/01/21 1600  ceFAZolin (ANCEF) IVPB 2g/100 mL premix  Status:  Discontinued        2 g 200 mL/hr over 30 Minutes Intravenous Every 8 hours 03/01/21 0946 03/02/21 2004   03/01/21 0000  ceFAZolin (ANCEF) IVPB 2g/100 mL premix        2 g 200 mL/hr over 30 Minutes Intravenous Every 8 hours 02/28/21 1858 03/01/21 0939   02/28/21 1632  tobramycin (NEBCIN) powder  Status:  Discontinued          As needed 02/28/21 1633 02/28/21 1835   02/28/21 1632  vancomycin (VANCOCIN) powder  Status:  Discontinued          As needed 02/28/21 1634 02/28/21 1835   02/20/21 1630  vancomycin (VANCOCIN) powder  Status:  Discontinued          As needed 02/20/21 1649 02/20/21 1943   02/10/21 0727  ceFAZolin (ANCEF) 2-4 GM/100ML-% IVPB       Note to Pharmacy: Lyda KalataJarvela, Joshua   : cabinet override      02/10/21 0727 02/10/21 1856   02/01/21 0600  ceFAZolin (ANCEF) IVPB 2g/100 mL premix  Status:  Discontinued        2 g 200 mL/hr over 30 Minutes Intravenous Every 8 hours 02/01/21 0414 02/28/21 1858   01/31/21 1800  cefTRIAXone (ROCEPHIN) 1 g in sodium chloride 0.9 % 100 mL IVPB  Status:  Discontinued        1 g 200 mL/hr over 30 Minutes Intravenous Every 24 hours 01/31/21 1453 02/01/21 0425   01/31/21  1115  vancomycin (VANCOREADY) IVPB 2000 mg/400 mL        2,000 mg 200 mL/hr over 120 Minutes Intravenous  Once 01/31/21 1105 01/31/21 1320   01/31/21 1045  ceFEPIme (MAXIPIME) 2 g in sodium chloride 0.9 % 100 mL IVPB        2 g 200 mL/hr over 30 Minutes Intravenous  Once 01/31/21 1035 01/31/21 1129   01/31/21 1045  metroNIDAZOLE (FLAGYL) IVPB 500 mg        500 mg 100 mL/hr over 60 Minutes Intravenous  Once 01/31/21 1035 01/31/21 1208   01/31/21 1045  vancomycin (VANCOCIN) IVPB 1000 mg/200 mL premix  Status:  Discontinued        1,000 mg 200 mL/hr over 60 Minutes Intravenous  Once 01/31/21 1035 01/31/21 1105         Subjective: Patient sitting up at the side of the bed eating a steak.  Denies any chest pain.  No shortness of breath.  Feeling well.  States after being here for 6 weeks is ready to go home. Events overnight noted.  Objective: Vitals:  03/14/21 0450 03/14/21 0745 03/14/21 1120 03/14/21 1128  BP: 131/84   (!) 128/96  Pulse: 77   84  Resp: 18  13 20   Temp: 98.1 F (36.7 C)   97.7 F (36.5 C)  TempSrc: Oral   Oral  SpO2: 100% 96% 96% 97%  Weight:      Height:        Intake/Output Summary (Last 24 hours) at 03/14/2021 1309 Last data filed at 03/13/2021 2147 Gross per 24 hour  Intake 440 ml  Output 1100 ml  Net -660 ml   Filed Weights   02/08/21 1540 02/20/21 1419 02/27/21 1443  Weight: 97.3 kg 97.3 kg 93 kg    Examination:  General exam: Appears calm and comfortable  Respiratory system: Clear to auscultation. Respiratory effort normal. Cardiovascular system: S1 & S2 heard, RRR. No JVD, murmurs, rubs, gallops or clicks. No pedal edema. Gastrointestinal system: Abdomen is nondistended, soft and nontender. No organomegaly or masses felt. Normal bowel sounds heard. Central nervous system: Alert and oriented. No focal neurological deficits. Extremities: Symmetric 5 x 5 power. Skin: Dressing noted on shoulders.  Dressing ordered on left lower chest area.    Psychiatry: Judgement and insight appear normal. Mood & affect appropriate.     Data Reviewed: I have personally reviewed following labs and imaging studies  CBC: Recent Labs  Lab 03/10/21 0328 03/11/21 0255 03/12/21 0320 03/13/21 0335 03/14/21 0307  WBC 10.7* 11.4* 12.1* 7.9 13.1*  NEUTROABS 7.1 7.5 8.4* 6.2 9.2*  HGB 7.4* 8.0* 8.4* 8.5* 7.9*  HCT 23.9* 25.3* 27.0* 27.4* 25.0*  MCV 92.3 90.4 90.9 90.7 91.6  PLT 503* 521* 565* 555* 512*    Basic Metabolic Panel: Recent Labs  Lab 03/09/21 0359 03/12/21 0320  NA 132* 132*  K 4.3 4.0  CL 95* 96*  CO2 29 27  GLUCOSE 327* 135*  BUN 18 21*  CREATININE 0.46* 0.41*  CALCIUM 8.3* 8.6*    GFR: Estimated Creatinine Clearance: 122.3 mL/min (A) (by C-G formula based on SCr of 0.41 mg/dL (L)).  Liver Function Tests: No results for input(s): AST, ALT, ALKPHOS, BILITOT, PROT, ALBUMIN in the last 168 hours.  CBG: Recent Labs  Lab 03/13/21 1154 03/13/21 1713 03/13/21 2121 03/14/21 0728 03/14/21 1124  GLUCAP 132* 224* 141* 103* 130*     Recent Results (from the past 240 hour(s))  Surgical pcr screen     Status: None   Collection Time: 03/06/21  3:07 AM   Specimen: Nasal Mucosa; Nasal Swab  Result Value Ref Range Status   MRSA, PCR NEGATIVE NEGATIVE Final   Staphylococcus aureus NEGATIVE NEGATIVE Final    Comment: (NOTE) The Xpert SA Assay (FDA approved for NASAL specimens in patients 51 years of age and older), is one component of a comprehensive surveillance program. It is not intended to diagnose infection nor to guide or monitor treatment. Performed at [facility], [address], [place], [place]          Radiology Studies: 95638 EKG SITE RITE  Result Date: 03/13/2021 If Site Rite image not attached, placement could not be confirmed due to current cardiac rhythm.       Scheduled Meds:  (feeding supplement) PROSource Plus  30 mL Oral QID   acetaminophen  500 mg Oral  QID   vitamin C  500 mg Oral BID   buPROPion  150 mg Oral BID   Chlorhexidine Gluconate Cloth  6 each Topical Daily   enoxaparin (LOVENOX) injection  40 mg Subcutaneous Q24H   feeding supplement  237 mL Oral BID BM   ferrous sulfate  325 mg Oral BID WC   gabapentin  200 mg Oral BID   insulin aspart  0-15 Units Subcutaneous TID WC   insulin aspart  0-5 Units Subcutaneous QHS   insulin glargine  25 Units Subcutaneous Daily   mouth rinse  15 mL Mouth Rinse BID   nutrition supplement (JUVEN)  1 packet Oral BID BM   oxyCODONE ER  1 tablet Oral Q12H   polyethylene glycol  17 g Oral Daily   senna-docusate  1 tablet Oral BID   sodium chloride flush  10-40 mL Intracatheter Q12H   sodium chloride flush  5 mL Intracatheter Q8H   zinc sulfate  220 mg Oral Daily   Continuous Infusions:  sodium chloride 250 mL (03/06/21 0417)   ceFEPime (MAXIPIME) IV 2 g (03/14/21 0548)     LOS: 42 days    Time spent: 40 minutes    Ramiro Harvest, MD [facility]   To contact the attending provider between 7A-7P or the covering provider during after hours 7P-7A, please log into the web site [email or web address] and access using universal [place] password for that web site. If you do not have the password, please call the hospital operator.  03/14/2021, 1:09 PM

## 2021-03-14 NOTE — Progress Notes (Signed)
Subjective:   Patient is alert, oriented, sitting on side of bed in pain. Waiting on pain medication. Bilateral shoulder pain has improved, continued low back pain. Denies chest pain, SOB, Calf pain. No nausea/vomiting. Lateral left shoulder pain pulled out yesterday. Hemovac has not had good seal with incisional drain.  PE: VITALS:   Vitals:   03/13/21 2357 03/14/21 0429 03/14/21 0450 03/14/21 0745  BP:   131/84   Pulse:   77   Resp: 15 16 18    Temp:   98.1 F (36.7 C)   TempSrc:   Oral   SpO2:  100% 100% 96%  Weight:      Height:       0-70 deg FF at right shoulder. 5 deg FF left shoulder. Axillary nerve sensation and deltoid firing intact bilaterally. Full ROM of bilateral elbows, wrist, and all fingers of hands. R shoulder incision healing well, removed most of the sutures today. L shoulder wound well opposed with moderate drainage from the distal aspect of the wound around the hemovac, aprox 10 cc's bloody drainage in hemovac. Distal sensation and capillary refill intact. 5/5 grip strength bilaterally.   LABS  Results for orders placed or performed during the hospital encounter of 01/31/21 (from the past 24 hour(s))  Glucose, capillary     Status: Abnormal   Collection Time: 03/13/21 11:54 AM  Result Value Ref Range   Glucose-Capillary 132 (H) 70 - 99 mg/dL  Glucose, capillary     Status: Abnormal   Collection Time: 03/13/21  5:13 PM  Result Value Ref Range   Glucose-Capillary 224 (H) 70 - 99 mg/dL  Glucose, capillary     Status: Abnormal   Collection Time: 03/13/21  9:21 PM  Result Value Ref Range   Glucose-Capillary 141 (H) 70 - 99 mg/dL  CBC with Differential/Platelet     Status: Abnormal   Collection Time: 03/14/21  3:07 AM  Result Value Ref Range   WBC 13.1 (H) 4.0 - 10.5 K/uL   RBC 2.73 (L) 4.22 - 5.81 MIL/uL   Hemoglobin 7.9 (L) 13.0 - 17.0 g/dL   HCT 03/16/21 (L) 11.9 - 14.7 %   MCV 91.6 80.0 - 100.0 fL   MCH 28.9 26.0 - 34.0 pg   MCHC 31.6 30.0 - 36.0  g/dL   RDW 82.9 (H) 56.2 - 13.0 %   Platelets 512 (H) 150 - 400 K/uL   nRBC 0.0 0.0 - 0.2 %   Neutrophils Relative % 69 %   Neutro Abs 9.2 (H) 1.7 - 7.7 K/uL   Lymphocytes Relative 20 %   Lymphs Abs 2.6 0.7 - 4.0 K/uL   Monocytes Relative 8 %   Monocytes Absolute 1.0 0.1 - 1.0 K/uL   Eosinophils Relative 2 %   Eosinophils Absolute 0.2 0.0 - 0.5 K/uL   Basophils Relative 0 %   Basophils Absolute 0.0 0.0 - 0.1 K/uL   Immature Granulocytes 1 %   Abs Immature Granulocytes 0.08 (H) 0.00 - 0.07 K/uL  Glucose, capillary     Status: Abnormal   Collection Time: 03/14/21  7:28 AM  Result Value Ref Range   Glucose-Capillary 103 (H) 70 - 99 mg/dL    03/16/21 EKG SITE RITE  Result Date: 03/13/2021 If Site Rite image not attached, placement could not be confirmed due to current cardiac rhythm.   Assessment/Plan: Bilateral shoulder septic arthritis - L shoulder irrigation and debridement 6/16 - R shoulder irrigation and debridement with distal clavicle resection 6/17 -  bilateral shoulder irrigation and debridement with wound vac placement bilaterally 6/29 - bilateral shoulder irrigation and debridement with closure and incisional wound vac placement, hemovac placement R shoulder, L wound vac and hemovac placement 7/7 - L shoulder irrigation and debridement with hemovac placement and wound vac placement 7/12 - L shoulder irrigation and debridement with wound vac placement, removed hemovac of bilateral shoulders 7/14 - L shoulder irrigation and debridement with hemovac placement and closure of wound 7/19  - patient ok to discharge home once home IV antibiotics are set up from ortho perspective - I'm planning to pull his hemovac today, however, patient did not want hemovac pulled this morning due to not having pain medicine. Will call over at lunch to make sure he has had pain meds and go ahead and pull left shoulder drain - continue daily dressing changes to bilateral shoulder - follow up in our  office in 1 week    Armida Sans 03/14/2021, 9:06 AM

## 2021-03-14 NOTE — TOC Progression Note (Signed)
Transition of Care [facility]) - Progression Note    Patient Details  Name: John Ware MRN: [id number] Date of Birth: May 05, 1966  Transition of Care [facility]) CM/SW Contact  Ellenora Talton, Olegario Messier, RN Phone Number: 03/14/2021, 2:19 PM  Clinical Narrative: spoke to patient/niece in rm about d/c plans-home w/good family support for iv abx asst w/care, has shower chair-therefore the one ordered is not needed. Family to transport home. [facility] to start initial instruction prior d/c home tomorrow-she will make arrangements w/spouse [name],also amanda(niece) to asst w/care @ home. Contacted several HHC agencies-[facility], all not able to do HHC. Referral for otpt PT sent. Contacted [facility] rep [employer] x [id number] will explore if any HHC agencies in network can asst w/HH nurisng. Family in agreement to d/c plan.      Expected Discharge Plan: Home w Home Health Services Barriers to Discharge: Continued Medical Work up  Expected Discharge Plan and Services Expected Discharge Plan: Home w Home Health Services   Discharge Planning Services: CM Consult Post Acute Care Choice: Home Health Living arrangements for the past 2 months: Single Family Home                           HH Arranged: IV Antibiotics HH Agency: [facility] Date HH Agency Contacted: 03/14/21 Time HH Agency Contacted: 1419 Representative spoke with at [facility] Agency: Pam   Social Determinants of Health (SDOH) Interventions    Readmission Risk Interventions Readmission Risk Prevention Plan 02/16/2021  Transportation Screening Complete  PCP or Specialist Appt within 3-5 Days Complete  HRI or Home Care Consult Complete  Social Work Consult for Recovery Care Planning/Counseling Complete  Palliative Care Screening Complete  Medication Review Oceanographer) Complete  Some recent data might be hidden

## 2021-03-14 NOTE — Anesthesia Postprocedure Evaluation (Signed)
Anesthesia Post Note  Patient: John Ware  Procedure(s) Performed: IRRIGATION AND DEBRIDEMENT SHOULDER (Left: Shoulder)     Patient location during evaluation: PACU Anesthesia Type: General Level of consciousness: awake and alert Pain management: pain level controlled Vital Signs Assessment: post-procedure vital signs reviewed and stable Respiratory status: spontaneous breathing, nonlabored ventilation, respiratory function stable and patient connected to nasal cannula oxygen Cardiovascular status: blood pressure returned to baseline and stable Postop Assessment: no apparent nausea or vomiting Anesthetic complications: no   No notable events documented.  Last Vitals:  Vitals:   03/14/21 0429 03/14/21 0450  BP:  131/84  Pulse:  77  Resp: 16 18  Temp:  36.7 C  SpO2: 100% 100%    Last Pain:  Vitals:   03/14/21 0450  TempSrc: Oral  PainSc:                  Siddharth Babington

## 2021-03-14 NOTE — Progress Notes (Signed)
On call hospital provider notified of dislodged drain line from left shoulder.  Provider came to bedside to evaluate and placed occlusive dressing over tube end.  Suction to hemovac restored.  Patient was in no distress during this time. Suction to hemovac continued to be effective and 20 mL of fluid was drained.   This nurse also contacted the nighttime on call orthopedic provider for update regarding the drain line.  Provider agreed with actions taken and will address with Dr Manson Passey in the morning.    Will continue to monitor patient.

## 2021-03-14 NOTE — Progress Notes (Signed)
Mobility Specialist - Progress Note     03/14/21 1458  Mobility  Activity Ambulated in hall  Level of Assistance Modified independent, requires aide device or extra time  Assistive Device Other (Comment) (IV Pole)  Distance Ambulated (ft) 500 ft  Mobility Ambulated independently in hallway  Mobility Response Tolerated well  Mobility performed by Mobility specialist  $Mobility charge 1 Mobility   Pt ambulated ~500 ft in hallway while pushing IV pole for stability. Pt did not c/o of SOB, pain, or dizziness, and is eager for d/c tomorrow. Pt tolerated session very well and returned to EOB after ambulation with call bell at side and family in room.   Arliss Journey Mobility Specialist Acute Rehabilitation Services Phone: [phone] 03/14/21, 3:00 PM

## 2021-03-14 NOTE — Progress Notes (Signed)
Occupational Therapy Treatment Patient Details Name: John Ware MRN: [id number] DOB: 01/29/1966 Today's Date: 03/14/2021    History of present illness 55 year old with history of chronic pain, HTN, DM2, COVID-positive at home 5/20 reporting of body pain, shoulder pain eventually came to the ER.  Found to have MSSA bacteremia started on IV Ancef.  Large fluid collection on the left lateral posterior chest and back was noted.  Underwent MRI T-spine and CT CAP showing large multiloculated effusion  and osteomyelitis of thoracic spine.  There was concerns for septic emboli but echo was negative for TEE performed on 6/13 showed large tricuspid vegetation.  General surgery and orthopedic consulted.  Underwent left shoulder debridement 02/08/2021.  Postop had increasing size of his abscess collection therefore general surgery performed repeat debridement with wound VAC placement.  Dressing management per surgical team.  CT maxillofacial showed oral lucencies therefore oral surgery consulted.  Oral surgery did not think this was acute and Low suspicion for source per ID.  Underwent repeat I&D by Ortho on 02/28/2021. patient had repeat I &D on left shoulder 7/12 and 7/14.  Back to the OR on 7/18 for repeat I&D.   OT comments  Patient reported plan to be d/c today or tomorrow. Patient was educated on all d/c recommendations. Patient verbalized understanding. Patient has achieved OT goals at this time with pain goal adequate for d/c at this time with patients chronic pain. Patient reported wife to assist in next level of care. Patient completed clothing management for toileting tasks with MI with IV pole. Patient had no further questions for OT at this time. Patient was noted to have drainage from wound vac site with nursing made aware. Patient was discharged from skilled OT services with plan to start [facility] OT when discharged from hospital.   Follow Up Recommendations  Home health OT    Equipment  Recommendations  3 in 1 bedside commode;Tub/shower seat    Recommendations for Other Services      Precautions / Restrictions Precautions Precautions: Fall Precaution Comments: WBAT BUE       Mobility Bed Mobility                    Transfers                      Balance     Sitting balance-[name]: Good       Standing balance-[name]: Fair Standing balance comment: now mobilizing by puhing IV pole with 1 hand                           ADL either performed or assessed with clinical judgement   ADL                           Toilet Transfer: Supervision/safety Toilet Transfer Details (indicate cue type and reason): with IV pole Toileting- Clothing Manipulation and Hygiene: Supervision/safety Toileting - Clothing Manipulation Details (indicate cue type and reason): with IV pole       General ADL Comments: patient completed functional mobiltiy in room with IV pole with MI. patient don/doffed socks with MI seated in chair in room. patient completed clothing management and toileting transfers with SUP.     Vision       Quarry manager  Exercises     Shoulder Instructions       General Comments      Pertinent Vitals/ Pain       Faces Pain Scale: Hurts a little bit Pain Location: Lt shoulder Pain Descriptors / Indicators: Sore  Home Living                                          Prior Functioning/Environment              Frequency  Min 2X/week        Progress Toward Goals  OT Goals(current goals can now be found in the care plan section)  Progress towards OT goals: Goals met/education completed, patient discharged from OT  Acute Rehab OT Goals Patient Stated Goal: to go home at discharge OT Goal Formulation: With patient Time For Goal Achievement: 03/12/21 Potential to Achieve  Goals: Good ADL Goals Pt Will Perform Grooming: with modified independence Pt Will Perform Toileting - Clothing Manipulation and hygiene: with modified independence  Plan Discharge plan remains appropriate    Co-evaluation                 AM-PAC OT "6 Clicks" Daily Activity     Outcome Measure   Help from another person eating meals?: A Little Help from another person taking care of personal grooming?: A Little Help from another person toileting, which includes using toliet, bedpan, or urinal?: A Little Help from another person bathing (including washing, rinsing, drying)?: A Lot Help from another person to put on and taking off regular upper body clothing?: A Little Help from another person to put on and taking off regular lower body clothing?: A Little 6 Click Score: 17    End of Session    OT Visit Diagnosis: Unsteadiness on feet (R26.81);Other abnormalities of gait and mobility (R26.89);History of falling (Z91.81);Muscle weakness (generalized) (M62.81);Pain Pain - Right/Left: Left Pain - part of body: Shoulder   Activity Tolerance Patient tolerated treatment well   Patient Left in chair;with call bell/phone within reach   Nurse Communication Other (comment) (consulted about drainage from wound vac site.)        Time: 1040-1103 OT Time Calculation (min): 23 min  Charges: OT General Charges $OT Visit: 1 Visit OT Treatments $Self Care/Home Management : 23-37 mins  Jackelyn Poling OTR/L, MS Acute Rehabilitation Department Office# [phone] Pager# [phone]    [employer] 03/14/2021, 12:08 PM

## 2021-03-15 DIAGNOSIS — F339 Major depressive disorder, recurrent, unspecified: Secondary | ICD-10-CM

## 2021-03-15 LAB — CBC WITH DIFFERENTIAL/PLATELET
Abs Immature Granulocytes: 0.08 10*3/uL — ABNORMAL HIGH (ref 0.00–0.07)
Basophils Absolute: 0 10*3/uL (ref 0.0–0.1)
Basophils Relative: 0 %
Eosinophils Absolute: 0.2 10*3/uL (ref 0.0–0.5)
Eosinophils Relative: 2 %
HCT: 28.2 % — ABNORMAL LOW (ref 39.0–52.0)
Hemoglobin: 8.9 g/dL — ABNORMAL LOW (ref 13.0–17.0)
Immature Granulocytes: 1 %
Lymphocytes Relative: 22 %
Lymphs Abs: 2.2 10*3/uL (ref 0.7–4.0)
MCH: 28.2 pg (ref 26.0–34.0)
MCHC: 31.6 g/dL (ref 30.0–36.0)
MCV: 89.2 fL (ref 80.0–100.0)
Monocytes Absolute: 1.1 10*3/uL — ABNORMAL HIGH (ref 0.1–1.0)
Monocytes Relative: 11 %
Neutro Abs: 6.2 10*3/uL (ref 1.7–7.7)
Neutrophils Relative %: 64 %
Platelets: 556 10*3/uL — ABNORMAL HIGH (ref 150–400)
RBC: 3.16 MIL/uL — ABNORMAL LOW (ref 4.22–5.81)
RDW: 16.1 % — ABNORMAL HIGH (ref 11.5–15.5)
WBC: 9.7 10*3/uL (ref 4.0–10.5)
nRBC: 0 % (ref 0.0–0.2)

## 2021-03-15 LAB — BASIC METABOLIC PANEL
Anion gap: 9 (ref 5–15)
BUN: 17 mg/dL (ref 6–20)
CO2: 27 mmol/L (ref 22–32)
Calcium: 9.1 mg/dL (ref 8.9–10.3)
Chloride: 99 mmol/L (ref 98–111)
Creatinine, Ser: 0.47 mg/dL — ABNORMAL LOW (ref 0.61–1.24)
GFR, Estimated: >60 mL/min (ref >60)
Glucose, Bld: 93 mg/dL (ref 70–99)
Potassium: 4 mmol/L (ref 3.5–5.1)
Sodium: 135 mmol/L (ref 135–145)

## 2021-03-15 LAB — GLUCOSE, CAPILLARY
Glucose-Capillary: 96 mg/dL (ref 70–99)
Glucose-Capillary: 210 mg/dL — ABNORMAL HIGH (ref 70–99)

## 2021-03-15 MED ORDER — BUPROPION HCL ER (XL) 150 MG PO TB24: 150.0000 mg | ORAL_TABLET | Freq: Two times a day (BID) | ORAL | Status: DC

## 2021-03-15 MED ORDER — SENNOSIDES-DOCUSATE SODIUM 8.6-50 MG PO TABS: 1.0000 | ORAL_TABLET | Freq: Two times a day (BID) | ORAL | 1 refills | Status: DC

## 2021-03-15 MED ORDER — ZINC SULFATE 220 (50 ZN) MG PO CAPS: 220.0000 mg | ORAL_CAPSULE | Freq: Every day | ORAL | Status: DC

## 2021-03-15 MED ORDER — HEPARIN SOD (PORK) LOCK FLUSH 100 UNIT/ML IV SOLN
250.0000 [IU] | INTRAVENOUS | Status: AC | PRN
  Administered 2021-03-15: 250 [IU]
  Filled 2021-03-15: qty 2.5

## 2021-03-15 MED ORDER — GABAPENTIN 800 MG PO TABS
800.0000 mg | ORAL_TABLET | Freq: Three times a day (TID) | ORAL | Status: DC
Start: 2021-03-15 — End: 2021-05-18

## 2021-03-15 MED ORDER — CEFEPIME IV (FOR PTA / DISCHARGE USE ONLY): 2.0000 g | Freq: Three times a day (TID) | INTRAVENOUS | 0 refills | Status: DC

## 2021-03-15 MED ORDER — ASCORBIC ACID 500 MG PO TABS: 500.0000 mg | ORAL_TABLET | Freq: Two times a day (BID) | ORAL | Status: DC

## 2021-03-15 MED ORDER — POLYETHYLENE GLYCOL 3350 17 G PO PACK
17.0000 g | PACK | Freq: Every day | ORAL | 1 refills | Status: DC
Start: 2021-03-16 — End: 2021-04-23

## 2021-03-15 MED ORDER — FERROUS SULFATE 325 (65 FE) MG PO TABS: 325.0000 mg | ORAL_TABLET | Freq: Two times a day (BID) | ORAL | 3 refills | Status: DC

## 2021-03-15 MED ORDER — ACETAMINOPHEN 500 MG PO TABS: 500.0000 mg | ORAL_TABLET | Freq: Four times a day (QID) | ORAL | 0 refills | Status: DC

## 2021-03-15 NOTE — Discharge Summary (Signed)
Physician Discharge Summary  RIYAN Ware WCB:[id number] DOB: December 19, 1965 DOA: 01/31/2021  PCP: Sharion Balloon, FNP  Admit date: 01/31/2021 Discharge date: 03/15/2021  Time spent: 60 minutes  Recommendations for Outpatient Follow-up:  Follow-up with Dr. Mardelle Matte, orthopedics in 1 week. Follow-up with [facility] in 3 weeks for wound recheck of left lateral chest wall wounds. Follow-up with Dr. Baxter Flattery, ID in 2 to 3 weeks. Follow-up with Sharion Balloon, FNP in 2 weeks.  On follow-up patient will need a CBC done to follow-up on H&H.  Patient will also need a basic metabolic profile done to follow-up on electrolytes and renal function.  Patient's blood pressure need to be reassessed as patient lisinopril was discontinued.  Patient also benefit from referral to dentistry.   Discharge Diagnoses:  Active Problems:   Right knee pain   Diabetes mellitus (HCC)   Hyponatremia   Pressure injury of skin   HTN (hypertension)   Anxiety   Left shoulder pain   Ascending aorta dilatation (HCC)   Vision changes   Endocarditis   Right shoulder pain   Chest wall abscess   Obesity   Normocytic anemia   Thrombocytosis   Psoas muscle abscess (HCC)   Abscess   History of COVID-19   Psoas abscess ([place] [facility])   Discharge Condition: Stable and improved.  Diet recommendation: Carb modified diet  Filed Weights   02/20/21 1419 02/27/21 1443 03/15/21 1102  Weight: 97.3 kg 93 kg 82.1 kg    History of present illness:  HPI per Dr. [name] is a 55 y.o. male with medical history significant of chronic pain, HTN, DM2. Presented with weakness. History is from wife as the patient is a poor historian. She reported that the patient has been generally unwell over the past 3 weeks or so. He had a positive home COVID test on 01/12/21. He had no respiratory symptoms at the time. He just had fevers that were responsive to APAP. He self isolated at home until 01/20/21 when he was at a party. He  apparently fell out of a chair and hurt his shoulder. He was evaluated in the ED and they found no fracture. He was given a sling and told to follow up with orthopedics. Since that time, the wife reported that he's had general body aches/pain that have increased and are not responsive to his regular chronic pain meds. He has had a generally poor appetite during this time. She reported that he's been unable to walk for the last 2 days. These constellation of symptoms were concerning, so she brought him to the ED.    ED Course: He was found to have a sodium of 113, a glucose of 339, a WBC of 20.5, and a lactic acid of 3.8. He was started on broad spec abx and given fluids. TRH was called for admission.  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 -Large left posterior wall and bilateral psoas muscle abscess, bilateral septic shoulder joint, osteomyelitis and discitis. -Patient noted to have floaters was seen by ophthalmology and endophthalmitis ruled out. -Patient underwent left shoulder debridement 6/16, right shoulder debridement 6/17, drain placement by IR 6/24, status post bilateral shoulder debridement with wound VAC placement 6/29. -Currently on Dilaudid PCA for pain control as well as oxycodone 10 mg every 4 hours as needed breakthrough pain. -Repeat CT shoulder performed. -  Status post I&D 7/6 with cultures growing Pseudomonas. -Antibiotics changed to cefepime on 03/07/2021. -Patient underwent another irrigation and debridement of the left shoulder on 03/06/2021. -Patient noted to have had wound VAC on left shoulder and Hemovac on bilateral shoulders. -Repeat MRI left shoulder 03/07/2021 with continued septic arthritis and acute osteomyelitis, full-thickness retracted tears of supraspinatus and  infraspinatus tendons.  Intramuscular edema was controlled within the supraspinatus, infraspinatus and anterior deltoid muscle, likely myositis. -Patient underwent another left shoulder irrigation debridement with wound VAC placement on 03/08/2021, both Hemovacs and bilateral shoulders were removed. -Patient underwent left shoulder excisional debridement, glenohumeral joint, deep abscess, subacromial space per Dr Mardelle Matte 03/12/2021. -Patient seen in consultation by ID who recommended cefepime for total of 4 more weeks with starting date 03/08/2021. -Patient underwent PICC line placement and removal of left IJ on 03/13/2021. -We will need outpatient follow-up with ID after IV antibiotics are done to convert to oral antibiotic suppression due to significant burden of disease. -Patient initially was on Dilaudid PCA pump and subsequently weaned back to home regimen of long-acting OxyContin as well as oxycodone as needed breakthrough pain.   2.  Hypertension -Patient's amlodipine and lisinopril held during the hospitalization.   -Blood pressure remained stable.   -Amlodipine will be resumed on discharge.    3.  Poor dentition with dental lucencies per CT maxillofacial -Oral surgery, Dr. Conley Simmonds did not think patient had any acute issues ongoing and low suspicion for potential source for ID. -We will need outpatient follow-up with dentistry.  4.  Anemia of chronic disease Status posttransfusion 3 units total packed red blood cells during this hospitalization with hemoglobin stabilized at 8.9 by day of discharge.   -Outpatient follow-up.  5.  DKA -Resolved.  6.  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 -Patient's oral hypoglycemic agents were held during the hospitalization and patient maintained on Lantus 25 units daily and SSI.   -Outpatient follow-up.   7.  Ascending aortic dilatation -2D echo 6/10 with mild dilatation  of ascending aorta, 38 mm -TEE with normal thoracic and ascending aorta noted on 02/05/2021.  8.  Anxiety -Patient maintained on Wellbutrin. -Zoloft discontinued during hospitalization secondary to hyponatremia. -Outpatient follow-up.  9.  COVID-19 infection -Resolved.  10.  Acute on chronic hyponatremia -Stable.  At baseline.  Patient's Zoloft was discontinued during the hospitalization.  11.  Pressure injury, POA Pressure Injury 01/31/21 Buttocks Right;Left;Posterior Stage 2 -  Partial thickness loss of dermis presenting as a shallow open injury with a red, pink wound bed without slough. (Active)  01/31/21 1546  Location: Buttocks  Location Orientation: Right;Left;Posterior  Staging: Stage 2 -  Partial thickness loss of dermis presenting as a shallow open injury with a red, pink wound bed without slough.  Wound Description (Comments):  Present on Admission: Yes       Procedures: CT chest/abdomen/pelvis 02/01/2021 CT chest without contrast 02/09/2021, 02/13/2021, 02/18/2022 CT left shoulder 02/09/2021, 02/13/2021, 02/18/2021 CT right shoulder 02/13/2021, 02/18/2021 CT abdomen and pelvis 02/23/2019 through Ultrasound-guided placement of 10 French all-purpose drain catheter into left subclavicular abscess with aspiration of 40 cc of bloody fluid per IR, Dr. Pascal Lux 02/15/2021 CT-guided placement of 10 French all-purpose drain catheter into dominant right-sided retroperitoneal abscess with aspiration of 35 cc of purulent fluid per IR, Dr. Pascal Lux 02/15/2021 CT-guided placement of 10 French percutaneous drain catheter into dominant left sided retroperitoneal abscess with aspiration of approximately 5 cc of bloody fluid per IR, Dr. Pascal Lux  02/16/2021 2D echo 02/03/2021 TEE 02/05/2021 MRI C-spine 02/15/2021 MRI L-spine 02/15/2021 MRI T-spine 02/15/2021 MRI right shoulder 02/07/2021 MRI left shoulder 02/07/2021, 03/07/2021 CT maxillofacial 02/26/2021 Left shoulder irrigation and debridement 02/08/2021 Right  shoulder irrigation and debridement with distal clavicle resection 02/09/2021 - bilateral shoulder irrigation and debridement with wound vac placement bilaterally 6/29 - bilateral shoulder irrigation and debridement with closure and incisional wound vac placement, hemovac placement R shoulder, L wound vac and hemovac placement 7/7 - L shoulder irrigation and debridement with hemovac placement and wound vac placement 7/12 - L shoulder irrigation and debridement with wound vac placement, removed hemovac of bilateral shoulders 7/14 - L shoulder irrigation and debridement with hemovac placement and closure of wound 7/19 PICC line placement 03/13/2021 Transfusion 2 units packed red blood cells 02/22/2021 Transfusion 1 unit packed red blood cells 02/16/2021  Consultations: Wound care Infectious disease: Dr. Baxter Flattery 02/01/2021 Interventional radiology: Dr. Kathlene Cote 02/02/2021 Ophthalmology: Dr. Alanda Slim 02/05/2021 Orthopedics: 03/12/2021.  02/07/2021 General surgery: Dr. Ninfa Linden 02/09/2021 Interventional radiology: Dr. Pascal Lux 02/15/2021  Discharge Exam: Vitals:   03/15/21 0615 03/15/21 1129  BP: (!) 144/92 105/70  Pulse: 82 84  Resp: 16 20  Temp: 98.1 F (36.7 C) 98 F (36.7 C)  SpO2: 97% 98%    General: NAD Cardiovascular: RRR no murmurs rubs or gallops.  No JVD.  No lower extremity edema. Respiratory: CTA B.  No wheezes, no crackles, no rhonchi.  Normal respiratory effort.  Discharge Instructions   Discharge Instructions     [facility] pharmacist to adjust dose for Vancomycin, Aminoglycosides and other anti-infective therapies as requested by physician.   Complete by: As directed    [facility] to provide Cath Flo 70m   Complete by: As directed    Administer for PICC line occlusion and as ordered by physician for other access device issues.   Ambulatory referral to Occupational Therapy   Complete by: As directed    Ambulatory referral to Physical Therapy   Complete  by: As directed    Anaphylaxis Kit: Provided to treat any anaphylactic reaction to the medication being provided to the patient if First Dose or when requested by physician   Complete by: As directed    Epinephrine 136mml vial / amp: Administer 0.24m51m0.24ml1mubcutaneously once for moderate to severe anaphylaxis, nurse to call physician and pharmacy when reaction occurs and call 911 if needed for immediate care   Diphenhydramine 50mg6mIV vial: Administer 25-50mg 78mM PRN for first dose reaction, rash, itching, mild reaction, nurse to call physician and pharmacy when reaction occurs   Sodium Chloride 0.9% NS 500ml I7mdminister if needed for hypovolemic blood pressure drop or as ordered by physician after call to physician with anaphylactic reaction   Change dressing on IV access line weekly and PRN   Complete by: As directed    Diet Carb Modified   Complete by: As directed    Discharge wound care:   Complete by: As directed    As above   Flush IV access with Sodium Chloride 0.9% and Heparin 10 units/ml or 100 units/ml   Complete by: As directed    Home infusion instructions - [facility]   Complete by: As directed    Instructions: Flush IV access with Sodium Chloride 0.9% and Heparin 10units/ml or 100units/ml   Change dressing on IV access line: Weekly and PRN   Instructions Cath Flo 2mg: Ad21mister for PICC Line occlusion and as ordered by physician for other access device  [facility] pharmacist to adjust dose for: Vancomycin, Aminoglycosides and other anti-infective therapies as requested by physician   Increase activity slowly   Complete by: As directed    Method of administration may be changed at the discretion of home infusion pharmacist based upon assessment of the patient and/or caregiver's ability to self-administer the medication ordered   Complete by: As directed       Allergies as of 03/15/2021       Reactions   Tizanidine    Leg cramps         Medication List     STOP taking these medications    busPIRone 5 MG tablet Commonly known as: BUSPAR   diclofenac Sodium 1 % Gel Commonly known as: Voltaren   empagliflozin 10 MG Tabs tablet Commonly known as: Jardiance   ramipril 10 MG capsule Commonly known as: ALTACE   sertraline 100 MG tablet Commonly known as: Zoloft       TAKE these medications    acetaminophen 500 MG tablet Commonly known as: TYLENOL Take 1 tablet (500 mg total) by mouth 4 (four) times daily. What changed:  medication strength how much to take when to take this reasons to take this   amLODipine 5 MG tablet Commonly known as: NORVASC Take 1 tablet (5 mg total) by mouth daily.   ascorbic acid 500 MG tablet Commonly known as: VITAMIN C Take 1 tablet (500 mg total) by mouth 2 (two) times daily.   buPROPion 150 MG 24 hr tablet Commonly known as: WELLBUTRIN XL Take 1 tablet (150 mg total) by mouth 2 (two) times daily. What changed: See the new instructions.   ceFEPime  IVPB Commonly known as: MAXIPIME Inject 2 g into the vein every 8 (eight) hours for 27 days. Indication:  MSSA/pseudomonas shoulder infection First Dose: Yes Last Day of Therapy:  04/05/21 Labs - Once weekly:  CBC/D and BMP, Labs - Every other week:  ESR and CRP Method of administration: IV Push Method of administration may be changed at the discretion of home infusion pharmacist based upon assessment of the patient and/or caregiver's ability to self-administer the medication ordered.   ferrous sulfate 325 (65 FE) MG tablet Take 1 tablet (325 mg total) by mouth 2 (two) times daily with a meal.   gabapentin 800 MG tablet Commonly known as: NEURONTIN Take 1 tablet (800 mg total) by mouth 3 (three) times daily. What changed: See the new instructions.   metFORMIN 750 MG 24 hr tablet Commonly known as: Glucophage XR Take 2 tablets (1,500 mg total) by mouth daily with breakfast.   methocarbamol 500 MG  tablet Commonly known as: ROBAXIN Take 1 tablet (500 mg total) by mouth 2 (two) times daily as needed for muscle spasms.   oxyCODONE-acetaminophen 10-325 MG tablet Commonly known as: PERCOCET Take 1 tablet by mouth 3 (three) times daily as needed for pain.   polyethylene glycol 17 g packet Commonly known as: MIRALAX / GLYCOLAX Take 17 g by mouth daily. Start taking on: March 16, 2021   senna-docusate 8.6-50 MG tablet Commonly known as: Senokot-S Take 1 tablet by mouth 2 (two) times daily.   Vitamin D (Ergocalciferol) 1.25 MG (50000 UNIT) Caps capsule Commonly known as: DRISDOL Take 50,000 Units by mouth once a week.   Xtampza ER 18 MG C12a Generic drug: oxyCODONE ER Take 1 tablet by mouth every 12 (twelve) hours.   zinc sulfate 220 (50 Zn) MG capsule Take 1 capsule (220 mg total) by mouth  daily. Start taking on: March 16, 2021               Durable Medical Equipment  (From admission, onward)           Start     Ordered   03/14/21 1225  For home use only DME 3 n 1  Once        03/14/21 1224              Discharge Care Instructions  (From admission, onward)           Start     Ordered   03/15/21 0000  Change dressing on IV access line weekly and PRN  (Home infusion instructions - [facility] )        03/15/21 1239   03/15/21 0000  Discharge wound care:       Comments: As above   03/15/21 1349           Allergies  Allergen Reactions   Tizanidine     Leg cramps    Follow-up Information     Marchia Bond, MD. Schedule an appointment as soon as possible for a visit in 1 week(s).   Specialty: Orthopedic Surgery Contact information: [place] [address] [phone]         Sur[facility], [place]. Schedule an appointment as soon as possible for a visit in 3 week(s).   Specialty: General Surgery Why: For wound re-check - left chest wall wounds Contact information: [place] [address] [phone]         [facility] Follow up.   Specialty: Rehabilitation Why: They will call you for appt. Contact information: [place] [id number] [place] [phone]        [facility] Follow up.   Why: home infusion initial instruction;med;supplies. Set up through [facility] for ongoing iv abx instruction, wound care. Contact information: [place]        Carlyle Basques, MD. Schedule an appointment as soon as possible for a visit in 3 week(s).   Specialty: Infectious Diseases Why: f?u in 2-3 weeks. Contact information: [address] [place] [address] [phone]         Sharion Balloon, FNP. Schedule an appointment as soon as possible for a visit in 2 week(s).   Specialty: Family Medicine Contact information: [place] [address] [phone]                  The results of significant diagnostics from this hospitalization (including imaging, microbiology, ancillary and laboratory) are listed below for reference.    Significant Diagnostic Studies: CT CHEST WO CONTRAST  Result Date: 02/26/2021 CLINICAL DATA:  Bilateral shoulder abscesses/septic arthritis. EXAM: CT CHEST WITHOUT CONTRAST TECHNIQUE: Multidetector CT imaging of the chest was performed following the standard protocol without IV contrast. COMPARISON:  02/18/2021 FINDINGS: Cardiovascular: Mild aortic arch atherosclerotic calcification. Mediastinum/Nodes: Reactive left axillary lymph nodes. Lungs/Pleura: Old granulomatous disease. Mild bandlike atelectasis in both lower lobes. 0.4 by 0.3 cm subpleural nodule anteriorly in the left upper lobe on image 64 series 5, stable. 0.8 by 0.6 cm lingular nodule, previously 1.0 by 0.9 cm. Continued wedge-shaped band of density in the inferior lingula, unchanged. 0.3 cm lingular  nodule on image 83 series 5, previously 0.5 by 0.4 cm. The trace bilateral pleural effusions have resolved. Upper Abdomen:  Unremarkable Musculoskeletal: Abnormalities in the shoulder regions are detailed on dedicated shoulder CT scans. Interval removal of the left flank catheter with some residual gas potentially along the original track or wound extending within and adjacent to the left latissimus dorsi anterior margin. Periapical lucencies along mandibular teeth, cannot exclude periapical abscesses. Prominent bridging spurring throughout much of the thoracic spine. IMPRESSION: 1. Reduced size of 2 lingular nodules, hence more likely inflammatory. 2. Interval removal of the left flank drain along the latissimus dorsi. Small amount of gas along the drain track noted. 3. Resolution of prior trace bilateral pleural effusions. 4. Periapical lucencies along the mandibular teeth, cannot exclude periapical abscesses. 5. Findings in the shoulder regions are detailed on separate reports. 6.  Aortic Atherosclerosis (ICD10-I70.0). 7. Reactive left axillary lymph nodes. Electronically Signed   By: Van Clines M.D.   On: 02/26/2021 13:05   CT CHEST WO CONTRAST  Result Date: 02/18/2021 CLINICAL DATA:  Left axillary abscess status post incision and drainage. Follow-up examination. EXAM: CT CHEST WITHOUT CONTRAST TECHNIQUE: Multidetector CT imaging of the chest was performed following the standard protocol without IV contrast. COMPARISON:  02/13/2021, 02/09/2021 FINDINGS: Cardiovascular: No significant coronary artery calcification. Global cardiac size within normal limits. Hypoattenuation of the cardiac blood pool is again identified and is unchanged, in keeping with at least mild anemia. No pericardial effusion. Central pulmonary arteries are of normal caliber. Thoracic aorta is unremarkable. Mediastinum/Nodes: Bulky adenopathy within the left axilla is unchanged. No mediastinal or hilar adenopathy identified. No  supraclavicular adenopathy identified though multiple small shotty left supraclavicular lymph nodes are identified, possibly reactive in nature. Visualized thyroid unremarkable. The esophagus is unremarkable. Lungs/Pleura: Mild bibasilar atelectasis.  Stable pulmonary nodules: 7 mm, subpleural left upper lobe, axial image # 110/7 5 mm, left upper lobe, axial image # 130/7 9 mm, lingula, axial image # 139/7 Small left pleural effusion is unchanged. No pneumothorax. Central airways are widely patent. Upper Abdomen: No acute abnormality. Musculoskeletal: Penrose type drain again noted within the inferior left axilla and posterolateral left chest wall, similar prior examination. Near complete resolution of subcutaneous debris and fluid within the left posterolateral chest wall adjacent to the surgical incision noted at axial image # 70. Mottled gas within the high left axilla adjacent to the left axillary vein, axial image # 37, likely represents a a necrotic lymph node and appears stable since prior examination. There is mottled gas and fluid within the joint capsule of the left shoulder, as seen on prior examination, now with extension of the gas in fluid into the subcutaneous if tissues of the left shoulder anteriorly, best appreciated on axial image # 34/2. Fluid is again seen tracking from the a left glenohumeral joint capsule into the subscapular space and extending superiorly to surround the apical left thoracic cage in extend into the supraclavicular region. A pigtail drainage catheter has been placed in this collection and there has been slight interval decrease in the amount of fluid noted superiorly, best appreciated on axial image # 20/2. Right shoulder effusion is again identified. Punctate foci of gas are again seen within the soft tissues anterior to the right shoulder within the pectoralis and deltoid musculature, similar to that noted on prior examination. IMPRESSION: Near complete resolution of fluid  in debris within the inferior, posterolateral left chest wall adjacent to the surgical incision and Penrose type surgical drainage catheter. Stable bulky lymphadenopathy within the left axilla with at least 1 necrotic lymph node noted superiorly, similar to prior examination. Gas  and fluid within the left shoulder joint capsule, similar to prior examination, now tracking anteriorly into the subcutaneous soft tissues anterior to the left shoulder as well as, similar prior examination, tracking posterior superiorly into the subscapular space and subsequently into the supraclavicular region. Interval placement of a pigtail type drainage catheter within the supraclavicular region with partial evacuation of fluid within this region. Stable right shoulder effusion and adjacent punctate gas and thickening of the pectoralis and deltoid musculature anterior to the coracoid process. Electronically Signed   By: Fidela Salisbury MD   On: 02/18/2021 23:49   CT CHEST WO CONTRAST  Result Date: 02/13/2021 CLINICAL DATA:  History of shoulder infection. EXAM: CT CHEST WITHOUT CONTRAST TECHNIQUE: Multidetector CT imaging of the chest was performed following the standard protocol without IV contrast. COMPARISON:  CT chest 02/09/2021 FINDINGS: Cardiovascular: The heart size is normal. No substantial pericardial effusion. No thoracic aortic aneurysm. Mediastinum/Nodes: No mediastinal lymphadenopathy. No evidence for gross hilar lymphadenopathy although assessment is limited by the lack of intravenous contrast on today's study. The esophagus has normal imaging features. Bulky lymphadenopathy is again identified in the left axilla. Lungs/Pleura: 7 mm subpleural left upper lobe pulmonary nodule on 71/7 is similar to prior. 6 mm nodule in the medial left upper lobe (91/7) and 12 mm nodule in the posterior left upper lobe are similar to prior. Bibasilar collapse/consolidation is not substantially changed in the interval. Probable tiny  bilateral effusions. Upper Abdomen: Unremarkable. Musculoskeletal: Soft tissue gas and fluid is identified in the left supraclavicular region, around the left shoulder, and in the lateral and posterior left chest wall. Fluid and gas in the supraclavicular region tracking posteriorly into the subscapular space is similar to prior. Diffuse edema noted in the muscles of the rotator cuff. Surgical drain noted posterolateral lower left thoracic wall with interval decrease in soft tissue gas and swelling in this region. There is an open wound in the region of the cervical drain. Gas and fluid are seen in the region of the right shoulder joint, with more gas visible on today's study than on the previous exam. Diffuse edema noted in the muscles of the rotator cuff. IMPRESSION: 1. Interval drain placement in the lateral and posterolateral left chest wall with decrease in the volume of gas and debris in the chest wall. 2. Similar gas in fluid in the left supraclavicular space and region of the left shoulder joint with diffuse edema of the rotator cuff muscles. 3. Gas is now visible in the soft tissues anterior to the right shoulder with similar appearance of fluid and edema in the soft tissues and muscles around the right shoulder. Electronically Signed   By: Misty Stanley M.D.   On: 02/13/2021 17:37   MR CERVICAL SPINE W WO CONTRAST  Result Date: 02/15/2021 CLINICAL DATA:  55 year old male with history of disseminated MSSA infection, evaluate for osteomyelitis discitis. EXAM: MRI CERVICAL, THORACIC AND LUMBAR SPINE WITHOUT AND WITH CONTRAST TECHNIQUE: Multiplanar and multiecho pulse sequences of the cervical spine, to include the craniocervical junction and cervicothoracic junction, and thoracic and lumbar spine, were obtained without and with intravenous contrast. CONTRAST:  55m GADAVIST GADOBUTROL 1 MMOL/ML IV SOLN COMPARISON:  Prior CT from 02/13/2021. FINDINGS: MRI CERVICAL SPINE FINDINGS Alignment: Examination  somewhat technically limited as the patient was unable to complete the entirety of the exam. No postcontrast images of the cervical spine are provided. Mild straightening of the normal cervical lordosis.  No listhesis. Vertebrae: Vertebral body height maintained without acute or  chronic fracture. Diffusely decreased T1 signal intensity seen throughout the visualized bone marrow, nonspecific, but most commonly related to anemia, smoking, or obesity. No discrete or worrisome osseous lesions. No findings to suggest osteomyelitis discitis or septic arthritis within the cervical spine. Cord: Normal signal and morphology. No epidural abscess or other collection. Posterior Fossa, vertebral arteries, paraspinal tissues: Partially empty sella noted. Visualized brain and posterior fossa otherwise unremarkable. Craniocervical junction normal. Known fluid collections/abscess involving the left chest wall are partially visualized. Largest loculated component seen at the left upper posterior chest and measures 5.3 x 1.6 cm (series 34, image 34). Additional separate and partially visualized loculated component position slightly laterally measures 4.3 x 1.6 cm (series 34, image 3). Scattered foci of internal susceptibility artifact consistent with gas. No extension to involve the underlying spinal canal or vertebral column. Associated scattered ill-defined edema noted within the musculature of the visualized upper posterior chest. Prevertebral soft tissues are within normal limits. Normal flow voids seen within the vertebral arteries bilaterally. Disc levels: C2-C3: Mild uncovertebral hypertrophy without significant disc bulge. Right worse than left facet hypertrophy. No spinal stenosis. Mild to moderate right C3 foraminal narrowing. Left neural foramina remains patent. C3-C4: Left paracentral disc protrusion with slight inferior migration indents the left ventral thecal sac (series 33, image 10). Mild flattening of the left  ventral cord without cord signal changes. Mild spinal stenosis. Superimposed uncovertebral and facet hypertrophy with resultant mild to moderate right worse than left C4 foraminal stenosis. C4-C5: Disc desiccation. Broad-based central disc protrusion indents the ventral thecal sac, eccentric to the left (series 33, image 15). Superimposed uncovertebral and facet hypertrophy. Resultant mild to moderate spinal stenosis. Severe right worse than left C5 foraminal narrowing. C5-C6: Small right paracentral disc protrusion indents the ventral thecal sac (series 34, image 25). Mild spinal stenosis without cord deformity. Superimposed bilateral facet hypertrophy. Mild to moderate left worse than right C6 foraminal narrowing. C6-C7: Disc desiccation with diffuse disc bulge. Superimposed central disc extrusion with inferior migration. Extruded disc material contacts and mildly flattens the ventral cord. No cord signal changes. Moderate spinal stenosis. Superimposed facet and uncovertebral hypertrophy with resultant severe right worse than left C7 foraminal stenosis. C7-T1: Small amount of extruded disc material extends from the C6-7 level above. No other disc pathology at this level. Right worse than left facet hypertrophy. No significant spinal stenosis. Mild right C8 foraminal narrowing. Left neural foramina remains patent. MRI THORACIC SPINE FINDINGS Alignment:  Examination technically limited by motion artifact. Normal alignment with preservation of the normal thoracic kyphosis. No listhesis. Vertebrae: Vertebral body height maintained without acute or chronic fracture. Bone marrow signal intensity diffusely decreased on T1 weighted imaging. 1.5 cm benign hemangioma noted within the T7 vertebral body. No worrisome osseous lesions. Abnormal marrow edema and enhancement seen involving the T3, T4, T5, and T6 vertebral bodies, suspicious for acute osteomyelitis. Probable involvement of the anterior aspects of the T10 and T11  vertebral bodies as well (series 37, image 4) no convincing evidence for intervening discitis at this time. Mild marrow edema seen about the right T3-4 and T4-5 facets, suspected to reflect concomitant septic arthritis (series 37, image 2). Cord: No convincing cord signal abnormality seen on this motion degraded exam. Thin enhancement seen involving the ventral epidural space at T3 through T6, likely related to mild infection/phlegmon (series 37, image 6). Possible scattered dorsal involvement noted as well. No frank epidural abscess. Paraspinal and other soft tissues: Known left chest wall collections/abscesses are partially visualized, with largest  component seen at the upper left posterior chest measuring 11.8 x 1.9 cm (series 38, image 4). Mild paraspinous phlegmon/enhancement seen adjacent to the infected vertebral bodies, most pronounced at T3-4 and T4-5 (series 39, image 10, 15) no frank paraspinous abscess or collection. Mildly irregular layering bilateral pleural effusions, left greater than right partially visualized. Atelectasis and/or infiltrates noted within the partially visualized lungs. Disc levels: T1-2: Negative interspace. Bilateral facet hypertrophy. No stenosis. T2-3: Facet hypertrophy. Probable right foraminal disc protrusion. No significant spinal stenosis. Mild bilateral foraminal narrowing. T3-4: Negative interspace. Bilateral facet hypertrophy with possible septic arthritis on the right. No significant stenosis. T4-5: Central/right paracentral disc protrusion indents the ventral thecal sac. Flattening of the right ventral cord. Posterior element hypertrophy. Mild spinal stenosis. Foramina remain patent. T5-6: Right paracentral disc protrusion indents the right ventral thecal sac, contacting and flattening the right cord. Moderate spinal stenosis. Foramina remain patent. T6-7: Central/left paracentral disc extrusion with superior migration contacts and flattens the ventral cord. Posterior  element hypertrophy with prominence of the epidural fat. Mild to moderate spinal stenosis. Foramina remain patent. T7-8: Shallow left paracentral disc protrusion. Prominence of the dorsal epidural fat with mild posterior element hypertrophy. Mild spinal stenosis. Foramina remain patent. T8-9: Small central irregular disc protrusion. Mild posterior element hypertrophy. Mild spinal stenosis. Foramina remain patent. T9-10: Left paracentral to foraminal disc protrusion. Posterior element hypertrophy. Mild spinal stenosis. Mild left neural foraminal narrowing. T10-11: Right paracentral disc protrusion with superior migration. Moderate facet hypertrophy. Mild flattening of the ventral cord with mild-to-moderate spinal stenosis. Moderate bilateral foraminal narrowing. T11-12: Right paracentral disc extrusion with superior migration. Secondary flattening of the right ventral cord. Moderate facet hypertrophy. Resultant moderate canal with bilateral foraminal stenosis. T12-L1: Right paracentral disc protrusion. Moderate facet hypertrophy. No significant stenosis. MRI LUMBAR SPINE FINDINGS Segmentation:  Standard. Alignment: 7 mm anterolisthesis of L5 on S1. Trace retrolisthesis of L1 on L2 and L2 on L3. Vertebrae: Sequelae of prior PLIF at L3 through S1. Vertebral body height maintained without acute or chronic fracture. Diffusely decreased T1 signal intensity within the underlying bone marrow. There is abnormal edema involving the L2-3 disc, with marrow edema involving the adjacent L2 and L3 vertebral bodies, consistent with osteomyelitis discitis. Suspected additional involvement of the adjacent L1-2 disc as well. Conus medullaris and cauda equina: Conus extends to the L1 level. Conus and cauda equina appear normal. No visible significant epidural involvement or epidural abscess. Paraspinal and other soft tissues: Abnormal edema with multifocal abscesses seen involving the psoas muscles bilaterally. Largest of these seen on  the right and measures 3.7 x 2.8 cm (series 28, image 30). Additional collection at the laminectomy site within the posterior soft tissues measures 2.9 x 2.1 x 5.9 cm, felt to be most consistent with a benign postoperative seroma. Additional chronic postoperative changes noted elsewhere within the posterior paraspinous soft tissues. No visible SI joint involvement. Disc levels: L1-2: Retrolisthesis. Intervertebral disc space narrowing with mild disc bulge. Moderate facet hypertrophy. No significant spinal stenosis. Mild bilateral L1 foraminal narrowing. L2-3: Trace retrolisthesis. Mild disc bulge. Advanced facet and ligament flavum hypertrophy. Resultant moderate spinal stenosis. Moderate bilateral L2 foraminal narrowing. L3-4:  Prior PLIF.  No residual stenosis. L4-5:  Prior PLIF.  No residual stenosis. L5-S1: Chronic 7 mm anterolisthesis with prior PLIF. No significant residual spinal stenosis. Probable residual mild to moderate foraminal narrowing largely due to slippage. IMPRESSION: MRI CERVICAL SPINE IMPRESSION: 1. No evidence for osteomyelitis discitis within the cervical spine. No epidural abscess or other  abnormality. 2. Partially visualized collections/abscesses involving the left upper chest wall, better seen on recent CT examinations. No extension to involve the underlying spinal canal or vertebral column. 3. Multilevel degenerative spondylosis with resultant mild to moderate spinal stenosis at C3-4 through C6-7 as above. Severe right worse than left C5 and C7 foraminal stenosis. MRI THORACIC SPINE IMPRESSION: 1. Findings consistent with acute osteomyelitis involving the T3, T4, T5, and T6 vertebral bodies, with probable involvement of the anterior aspect of the T10 and T11 vertebral bodies. No convincing evidence for intervening discitis at this time. 2. Mild marrow edema and enhancement involving the right T3-4 and T4-5 facets, consistent with concomitant septic arthritis. 3. Enhancement involving the  ventral epidural space at T3 through T6, consistent with mild/early epidural involvement. No frank epidural abscess. 4. Partially visualized left chest wall abscesses/collections with bilateral pleural effusions. 5. Multilevel degenerative spondylosis with resultant mild to moderate diffuse spinal stenosis throughout the thoracic spine as detailed above. MRI LUMBAR SPINE IMPRESSION: 1. 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 No appreciable epidural involvement at this time. 2. Multifocal abscesses involving the bilateral psoas musculature, largest of which on the right measures up to 3.7 cm. 3. Postoperative changes from prior PLIF at L3-4 through L5-S1 without residual spinal stenosis. Collection at the posterior laminectomy site felt to be most consistent with a benign postoperative seroma. 4. 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 Electronically Signed   By: Jeannine Boga M.D.   On: 02/15/2021 03:11   MR THORACIC SPINE W WO CONTRAST  Result Date: 02/15/2021 CLINICAL DATA:  55 year old male with history of disseminated MSSA infection, evaluate for osteomyelitis discitis. EXAM: MRI CERVICAL, THORACIC AND LUMBAR SPINE WITHOUT AND WITH CONTRAST TECHNIQUE: Multiplanar and multiecho pulse sequences of the cervical spine, to include the craniocervical junction and cervicothoracic junction, and thoracic and lumbar spine, were obtained without and with intravenous contrast. CONTRAST:  57m GADAVIST GADOBUTROL 1 MMOL/ML IV SOLN COMPARISON:  Prior CT from 02/13/2021. FINDINGS: MRI CERVICAL SPINE FINDINGS Alignment: Examination somewhat technically limited as the patient was unable to complete the entirety of the exam. No postcontrast images of the cervical spine are provided. Mild straightening of the normal cervical lordosis.  No listhesis. Vertebrae: Vertebral body height maintained without acute or chronic fracture.  Diffusely decreased T1 signal intensity seen throughout the visualized bone marrow, nonspecific, but most commonly related to anemia, smoking, or obesity. No discrete or worrisome osseous lesions. No findings to suggest osteomyelitis discitis or septic arthritis within the cervical spine. Cord: Normal signal and morphology. No epidural abscess or other collection. Posterior Fossa, vertebral arteries, paraspinal tissues: Partially empty sella noted. Visualized brain and posterior fossa otherwise unremarkable. Craniocervical junction normal. Known fluid collections/abscess involving the left chest wall are partially visualized. Largest loculated component seen at the left upper posterior chest and measures 5.3 x 1.6 cm (series 34, image 34). Additional separate and partially visualized loculated component position slightly laterally measures 4.3 x 1.6 cm (series 34, image 3). Scattered foci of internal susceptibility artifact consistent with gas. No extension to involve the underlying spinal canal or vertebral column. Associated scattered ill-defined edema noted within the musculature of the visualized upper posterior chest. Prevertebral soft tissues are within normal limits. Normal flow voids seen within the vertebral arteries bilaterally. Disc levels: C2-C3: Mild uncovertebral hypertrophy without significant disc bulge. Right worse than left facet hypertrophy. No spinal stenosis. Mild to moderate right C3 foraminal narrowing. Left  neural foramina remains patent. C3-C4: Left paracentral disc protrusion with slight inferior migration indents the left ventral thecal sac (series 33, image 10). Mild flattening of the left ventral cord without cord signal changes. Mild spinal stenosis. Superimposed uncovertebral and facet hypertrophy with resultant mild to moderate right worse than left C4 foraminal stenosis. C4-C5: Disc desiccation. Broad-based central disc protrusion indents the ventral thecal sac, eccentric to the left  (series 33, image 15). Superimposed uncovertebral and facet hypertrophy. Resultant mild to moderate spinal stenosis. Severe right worse than left C5 foraminal narrowing. C5-C6: Small right paracentral disc protrusion indents the ventral thecal sac (series 34, image 25). Mild spinal stenosis without cord deformity. Superimposed bilateral facet hypertrophy. Mild to moderate left worse than right C6 foraminal narrowing. C6-C7: Disc desiccation with diffuse disc bulge. Superimposed central disc extrusion with inferior migration. Extruded disc material contacts and mildly flattens the ventral cord. No cord signal changes. Moderate spinal stenosis. Superimposed facet and uncovertebral hypertrophy with resultant severe right worse than left C7 foraminal stenosis. C7-T1: Small amount of extruded disc material extends from the C6-7 level above. No other disc pathology at this level. Right worse than left facet hypertrophy. No significant spinal stenosis. Mild right C8 foraminal narrowing. Left neural foramina remains patent. MRI THORACIC SPINE FINDINGS Alignment:  Examination technically limited by motion artifact. Normal alignment with preservation of the normal thoracic kyphosis. No listhesis. Vertebrae: Vertebral body height maintained without acute or chronic fracture. Bone marrow signal intensity diffusely decreased on T1 weighted imaging. 1.5 cm benign hemangioma noted within the T7 vertebral body. No worrisome osseous lesions. Abnormal marrow edema and enhancement seen involving the T3, T4, T5, and T6 vertebral bodies, suspicious for acute osteomyelitis. Probable involvement of the anterior aspects of the T10 and T11 vertebral bodies as well (series 37, image 4) no convincing evidence for intervening discitis at this time. Mild marrow edema seen about the right T3-4 and T4-5 facets, suspected to reflect concomitant septic arthritis (series 37, image 2). Cord: No convincing cord signal abnormality seen on this motion  degraded exam. Thin enhancement seen involving the ventral epidural space at T3 through T6, likely related to mild infection/phlegmon (series 37, image 6). Possible scattered dorsal involvement noted as well. No frank epidural abscess. Paraspinal and other soft tissues: Known left chest wall collections/abscesses are partially visualized, with largest component seen at the upper left posterior chest measuring 11.8 x 1.9 cm (series 38, image 4). Mild paraspinous phlegmon/enhancement seen adjacent to the infected vertebral bodies, most pronounced at T3-4 and T4-5 (series 39, image 10, 15) no frank paraspinous abscess or collection. Mildly irregular layering bilateral pleural effusions, left greater than right partially visualized. Atelectasis and/or infiltrates noted within the partially visualized lungs. Disc levels: T1-2: Negative interspace. Bilateral facet hypertrophy. No stenosis. T2-3: Facet hypertrophy. Probable right foraminal disc protrusion. No significant spinal stenosis. Mild bilateral foraminal narrowing. T3-4: Negative interspace. Bilateral facet hypertrophy with possible septic arthritis on the right. No significant stenosis. T4-5: Central/right paracentral disc protrusion indents the ventral thecal sac. Flattening of the right ventral cord. Posterior element hypertrophy. Mild spinal stenosis. Foramina remain patent. T5-6: Right paracentral disc protrusion indents the right ventral thecal sac, contacting and flattening the right cord. Moderate spinal stenosis. Foramina remain patent. T6-7: Central/left paracentral disc extrusion with superior migration contacts and flattens the ventral cord. Posterior element hypertrophy with prominence of the epidural fat. Mild to moderate spinal stenosis. Foramina remain patent. T7-8: Shallow left paracentral disc protrusion. Prominence of the dorsal epidural fat with mild  posterior element hypertrophy. Mild spinal stenosis. Foramina remain patent. T8-9: Small central  irregular disc protrusion. Mild posterior element hypertrophy. Mild spinal stenosis. Foramina remain patent. T9-10: Left paracentral to foraminal disc protrusion. Posterior element hypertrophy. Mild spinal stenosis. Mild left neural foraminal narrowing. T10-11: Right paracentral disc protrusion with superior migration. Moderate facet hypertrophy. Mild flattening of the ventral cord with mild-to-moderate spinal stenosis. Moderate bilateral foraminal narrowing. T11-12: Right paracentral disc extrusion with superior migration. Secondary flattening of the right ventral cord. Moderate facet hypertrophy. Resultant moderate canal with bilateral foraminal stenosis. T12-L1: Right paracentral disc protrusion. Moderate facet hypertrophy. No significant stenosis. MRI LUMBAR SPINE FINDINGS Segmentation:  Standard. Alignment: 7 mm anterolisthesis of L5 on S1. Trace retrolisthesis of L1 on L2 and L2 on L3. Vertebrae: Sequelae of prior PLIF at L3 through S1. Vertebral body height maintained without acute or chronic fracture. Diffusely decreased T1 signal intensity within the underlying bone marrow. There is abnormal edema involving the L2-3 disc, with marrow edema involving the adjacent L2 and L3 vertebral bodies, consistent with osteomyelitis discitis. Suspected additional involvement of the adjacent L1-2 disc as well. Conus medullaris and cauda equina: Conus extends to the L1 level. Conus and cauda equina appear normal. No visible significant epidural involvement or epidural abscess. Paraspinal and other soft tissues: Abnormal edema with multifocal abscesses seen involving the psoas muscles bilaterally. Largest of these seen on the right and measures 3.7 x 2.8 cm (series 28, image 30). Additional collection at the laminectomy site within the posterior soft tissues measures 2.9 x 2.1 x 5.9 cm, felt to be most consistent with a benign postoperative seroma. Additional chronic postoperative changes noted elsewhere within the  posterior paraspinous soft tissues. No visible SI joint involvement. Disc levels: L1-2: Retrolisthesis. Intervertebral disc space narrowing with mild disc bulge. Moderate facet hypertrophy. No significant spinal stenosis. Mild bilateral L1 foraminal narrowing. L2-3: Trace retrolisthesis. Mild disc bulge. Advanced facet and ligament flavum hypertrophy. Resultant moderate spinal stenosis. Moderate bilateral L2 foraminal narrowing. L3-4:  Prior PLIF.  No residual stenosis. L4-5:  Prior PLIF.  No residual stenosis. L5-S1: Chronic 7 mm anterolisthesis with prior PLIF. No significant residual spinal stenosis. Probable residual mild to moderate foraminal narrowing largely due to slippage. IMPRESSION: MRI CERVICAL SPINE IMPRESSION: 1. No evidence for osteomyelitis discitis within the cervical spine. No epidural abscess or other abnormality. 2. Partially visualized collections/abscesses involving the left upper chest wall, better seen on recent CT examinations. No extension to involve the underlying spinal canal or vertebral column. 3. Multilevel degenerative spondylosis with resultant mild to moderate spinal stenosis at C3-4 through C6-7 as above. Severe right worse than left C5 and C7 foraminal stenosis. MRI THORACIC SPINE IMPRESSION: 1. Findings consistent with acute osteomyelitis involving the T3, T4, T5, and T6 vertebral bodies, with probable involvement of the anterior aspect of the T10 and T11 vertebral bodies. No convincing evidence for intervening discitis at this time. 2. Mild marrow edema and enhancement involving the right T3-4 and T4-5 facets, consistent with concomitant septic arthritis. 3. Enhancement involving the ventral epidural space at T3 through T6, consistent with mild/early epidural involvement. No frank epidural abscess. 4. Partially visualized left chest wall abscesses/collections with bilateral pleural effusions. 5. Multilevel degenerative spondylosis with resultant mild to moderate diffuse spinal  stenosis throughout the thoracic spine as detailed above. MRI LUMBAR SPINE IMPRESSION: 1. 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 No appreciable epidural involvement at this time. 2. Multifocal abscesses involving the bilateral psoas musculature, largest of which  on the right measures up to 3.7 cm. 3. Postoperative changes from prior PLIF at L3-4 through L5-S1 without residual spinal stenosis. Collection at the posterior laminectomy site felt to be most consistent with a benign postoperative seroma. 4. 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 Electronically Signed   By: Jeannine Boga M.D.   On: 02/15/2021 03:11   MR Lumbar Spine W Wo Contrast  Result Date: 02/15/2021 CLINICAL DATA:  55 year old male with history of disseminated MSSA infection, evaluate for osteomyelitis discitis. EXAM: MRI CERVICAL, THORACIC AND LUMBAR SPINE WITHOUT AND WITH CONTRAST TECHNIQUE: Multiplanar and multiecho pulse sequences of the cervical spine, to include the craniocervical junction and cervicothoracic junction, and thoracic and lumbar spine, were obtained without and with intravenous contrast. CONTRAST:  30m GADAVIST GADOBUTROL 1 MMOL/ML IV SOLN COMPARISON:  Prior CT from 02/13/2021. FINDINGS: MRI CERVICAL SPINE FINDINGS Alignment: Examination somewhat technically limited as the patient was unable to complete the entirety of the exam. No postcontrast images of the cervical spine are provided. Mild straightening of the normal cervical lordosis.  No listhesis. Vertebrae: Vertebral body height maintained without acute or chronic fracture. Diffusely decreased T1 signal intensity seen throughout the visualized bone marrow, nonspecific, but most commonly related to anemia, smoking, or obesity. No discrete or worrisome osseous lesions. No findings to suggest osteomyelitis discitis or septic arthritis within the cervical spine. Cord:  Normal signal and morphology. No epidural abscess or other collection. Posterior Fossa, vertebral arteries, paraspinal tissues: Partially empty sella noted. Visualized brain and posterior fossa otherwise unremarkable. Craniocervical junction normal. Known fluid collections/abscess involving the left chest wall are partially visualized. Largest loculated component seen at the left upper posterior chest and measures 5.3 x 1.6 cm (series 34, image 34). Additional separate and partially visualized loculated component position slightly laterally measures 4.3 x 1.6 cm (series 34, image 3). Scattered foci of internal susceptibility artifact consistent with gas. No extension to involve the underlying spinal canal or vertebral column. Associated scattered ill-defined edema noted within the musculature of the visualized upper posterior chest. Prevertebral soft tissues are within normal limits. Normal flow voids seen within the vertebral arteries bilaterally. Disc levels: C2-C3: Mild uncovertebral hypertrophy without significant disc bulge. Right worse than left facet hypertrophy. No spinal stenosis. Mild to moderate right C3 foraminal narrowing. Left neural foramina remains patent. C3-C4: Left paracentral disc protrusion with slight inferior migration indents the left ventral thecal sac (series 33, image 10). Mild flattening of the left ventral cord without cord signal changes. Mild spinal stenosis. Superimposed uncovertebral and facet hypertrophy with resultant mild to moderate right worse than left C4 foraminal stenosis. C4-C5: Disc desiccation. Broad-based central disc protrusion indents the ventral thecal sac, eccentric to the left (series 33, image 15). Superimposed uncovertebral and facet hypertrophy. Resultant mild to moderate spinal stenosis. Severe right worse than left C5 foraminal narrowing. C5-C6: Small right paracentral disc protrusion indents the ventral thecal sac (series 34, image 25). Mild spinal stenosis  without cord deformity. Superimposed bilateral facet hypertrophy. Mild to moderate left worse than right C6 foraminal narrowing. C6-C7: Disc desiccation with diffuse disc bulge. Superimposed central disc extrusion with inferior migration. Extruded disc material contacts and mildly flattens the ventral cord. No cord signal changes. Moderate spinal stenosis. Superimposed facet and uncovertebral hypertrophy with resultant severe right worse than left C7 foraminal stenosis. C7-T1: Small amount of extruded disc material extends from the C6-7 level above. No other disc pathology at this level. Right worse than left facet  hypertrophy. No significant spinal stenosis. Mild right C8 foraminal narrowing. Left neural foramina remains patent. MRI THORACIC SPINE FINDINGS Alignment:  Examination technically limited by motion artifact. Normal alignment with preservation of the normal thoracic kyphosis. No listhesis. Vertebrae: Vertebral body height maintained without acute or chronic fracture. Bone marrow signal intensity diffusely decreased on T1 weighted imaging. 1.5 cm benign hemangioma noted within the T7 vertebral body. No worrisome osseous lesions. Abnormal marrow edema and enhancement seen involving the T3, T4, T5, and T6 vertebral bodies, suspicious for acute osteomyelitis. Probable involvement of the anterior aspects of the T10 and T11 vertebral bodies as well (series 37, image 4) no convincing evidence for intervening discitis at this time. Mild marrow edema seen about the right T3-4 and T4-5 facets, suspected to reflect concomitant septic arthritis (series 37, image 2). Cord: No convincing cord signal abnormality seen on this motion degraded exam. Thin enhancement seen involving the ventral epidural space at T3 through T6, likely related to mild infection/phlegmon (series 37, image 6). Possible scattered dorsal involvement noted as well. No frank epidural abscess. Paraspinal and other soft tissues: Known left chest wall  collections/abscesses are partially visualized, with largest component seen at the upper left posterior chest measuring 11.8 x 1.9 cm (series 38, image 4). Mild paraspinous phlegmon/enhancement seen adjacent to the infected vertebral bodies, most pronounced at T3-4 and T4-5 (series 39, image 10, 15) no frank paraspinous abscess or collection. Mildly irregular layering bilateral pleural effusions, left greater than right partially visualized. Atelectasis and/or infiltrates noted within the partially visualized lungs. Disc levels: T1-2: Negative interspace. Bilateral facet hypertrophy. No stenosis. T2-3: Facet hypertrophy. Probable right foraminal disc protrusion. No significant spinal stenosis. Mild bilateral foraminal narrowing. T3-4: Negative interspace. Bilateral facet hypertrophy with possible septic arthritis on the right. No significant stenosis. T4-5: Central/right paracentral disc protrusion indents the ventral thecal sac. Flattening of the right ventral cord. Posterior element hypertrophy. Mild spinal stenosis. Foramina remain patent. T5-6: Right paracentral disc protrusion indents the right ventral thecal sac, contacting and flattening the right cord. Moderate spinal stenosis. Foramina remain patent. T6-7: Central/left paracentral disc extrusion with superior migration contacts and flattens the ventral cord. Posterior element hypertrophy with prominence of the epidural fat. Mild to moderate spinal stenosis. Foramina remain patent. T7-8: Shallow left paracentral disc protrusion. Prominence of the dorsal epidural fat with mild posterior element hypertrophy. Mild spinal stenosis. Foramina remain patent. T8-9: Small central irregular disc protrusion. Mild posterior element hypertrophy. Mild spinal stenosis. Foramina remain patent. T9-10: Left paracentral to foraminal disc protrusion. Posterior element hypertrophy. Mild spinal stenosis. Mild left neural foraminal narrowing. T10-11: Right paracentral disc  protrusion with superior migration. Moderate facet hypertrophy. Mild flattening of the ventral cord with mild-to-moderate spinal stenosis. Moderate bilateral foraminal narrowing. T11-12: Right paracentral disc extrusion with superior migration. Secondary flattening of the right ventral cord. Moderate facet hypertrophy. Resultant moderate canal with bilateral foraminal stenosis. T12-L1: Right paracentral disc protrusion. Moderate facet hypertrophy. No significant stenosis. MRI LUMBAR SPINE FINDINGS Segmentation:  Standard. Alignment: 7 mm anterolisthesis of L5 on S1. Trace retrolisthesis of L1 on L2 and L2 on L3. Vertebrae: Sequelae of prior PLIF at L3 through S1. Vertebral body height maintained without acute or chronic fracture. Diffusely decreased T1 signal intensity within the underlying bone marrow. There is abnormal edema involving the L2-3 disc, with marrow edema involving the adjacent L2 and L3 vertebral bodies, consistent with osteomyelitis discitis. Suspected additional involvement of the adjacent L1-2 disc as well. Conus medullaris and cauda equina: Conus extends to the  L1 level. Conus and cauda equina appear normal. No visible significant epidural involvement or epidural abscess. Paraspinal and other soft tissues: Abnormal edema with multifocal abscesses seen involving the psoas muscles bilaterally. Largest of these seen on the right and measures 3.7 x 2.8 cm (series 28, image 30). Additional collection at the laminectomy site within the posterior soft tissues measures 2.9 x 2.1 x 5.9 cm, felt to be most consistent with a benign postoperative seroma. Additional chronic postoperative changes noted elsewhere within the posterior paraspinous soft tissues. No visible SI joint involvement. Disc levels: L1-2: Retrolisthesis. Intervertebral disc space narrowing with mild disc bulge. Moderate facet hypertrophy. No significant spinal stenosis. Mild bilateral L1 foraminal narrowing. L2-3: Trace retrolisthesis. Mild  disc bulge. Advanced facet and ligament flavum hypertrophy. Resultant moderate spinal stenosis. Moderate bilateral L2 foraminal narrowing. L3-4:  Prior PLIF.  No residual stenosis. L4-5:  Prior PLIF.  No residual stenosis. L5-S1: Chronic 7 mm anterolisthesis with prior PLIF. No significant residual spinal stenosis. Probable residual mild to moderate foraminal narrowing largely due to slippage. IMPRESSION: MRI CERVICAL SPINE IMPRESSION: 1. No evidence for osteomyelitis discitis within the cervical spine. No epidural abscess or other abnormality. 2. Partially visualized collections/abscesses involving the left upper chest wall, better seen on recent CT examinations. No extension to involve the underlying spinal canal or vertebral column. 3. Multilevel degenerative spondylosis with resultant mild to moderate spinal stenosis at C3-4 through C6-7 as above. Severe right worse than left C5 and C7 foraminal stenosis. MRI THORACIC SPINE IMPRESSION: 1. Findings consistent with acute osteomyelitis involving the T3, T4, T5, and T6 vertebral bodies, with probable involvement of the anterior aspect of the T10 and T11 vertebral bodies. No convincing evidence for intervening discitis at this time. 2. Mild marrow edema and enhancement involving the right T3-4 and T4-5 facets, consistent with concomitant septic arthritis. 3. Enhancement involving the ventral epidural space at T3 through T6, consistent with mild/early epidural involvement. No frank epidural abscess. 4. Partially visualized left chest wall abscesses/collections with bilateral pleural effusions. 5. Multilevel degenerative spondylosis with resultant mild to moderate diffuse spinal stenosis throughout the thoracic spine as detailed above. MRI LUMBAR SPINE IMPRESSION: 1. 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 No appreciable epidural involvement at this time. 2. Multifocal abscesses involving the bilateral psoas musculature, largest of  which on the right measures up to 3.7 cm. 3. Postoperative changes from prior PLIF at L3-4 through L5-S1 without residual spinal stenosis. Collection at the posterior laminectomy site felt to be most consistent with a benign postoperative seroma. 4. 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 Electronically Signed   By: Jeannine Boga M.D.   On: 02/15/2021 03:11   CT ABDOMEN PELVIS W CONTRAST  Result Date: 02/23/2021 CLINICAL DATA:  Retroperitoneal abscesses EXAM: CT ABDOMEN AND PELVIS WITH CONTRAST TECHNIQUE: Multidetector CT imaging of the abdomen and pelvis was performed using the standard protocol following bolus administration of intravenous contrast. CONTRAST:  12m OMNIPAQUE IOHEXOL 300 MG/ML  SOLN COMPARISON:  02/18/2021, 02/01/2021 FINDINGS: Lower chest: Bibasilar bandlike opacities compatible with atelectasis. Persistent 10 mm lingula nodule, image 30/6. Trace pleural effusions bilaterally. Normal heart size. No pericardial effusion. Left inferolateral chest open surgical wound from incision and debridement. Left inferior axillary prominent lymph nodes, which are partially imaged, suspect reactive adenopathy. Left posterior chest wall intramuscular thin curvilinear fluid collection measures 12 cm in width but only 1.7 cm in thickness compatible with a residual posterior chest wall  fluid collection/abscess. This does appear to extend to the open surgical wound superiorly. Degenerative changes noted of the spine. Hepatobiliary: Focal fatty infiltration of the liver along the falciform ligament anteriorly. No biliary dilatation. Gallbladder unremarkable. Common bile duct nondilated. Pancreas: Unremarkable. No pancreatic ductal dilatation or surrounding inflammatory changes. Spleen: Normal in size without focal abnormality. Adrenals/Urinary Tract: Adrenal glands are unremarkable. Kidneys are normal, without renal calculi, focal lesion, or  hydronephrosis. Bladder is unremarkable. Stomach/Bowel: Negative for bowel obstruction, significant dilatation, ileus, or free air. Normal appearing appendix containing air. No abdominopelvic ascites, new fluid collection, hemorrhage, or hematoma. Vascular/Lymphatic: Aortic atherosclerosis. Negative for aneurysm. No occlusive process. Mesenteric and renal vasculature appear patent. No veno-occlusive process. Reproductive: No significant finding by CT. Other: Bilateral posterior retroperitoneal drains within the psoas musculature. No significant residual retroperitoneal abscess or fluid collection at the drain catheter sites. Stable drain catheter positions. Musculoskeletal: Diffuse body anasarca. Stable thoracolumbar degenerative changes and lower lumbar fusion hardware. No new osseous finding. IMPRESSION: Resolved bilateral psoas retroperitoneal abscesses status post percutaneous drains. Stable drain catheter positions. No new intra-abdominal or pelvic fluid collection or abscess. Left inferior axillary adenopathy presumed reactive/inflammatory. Inferolateral left chest open surgical wound from I and D. Residual left posterolateral chest wall fluid collection measuring 12 x 1.7 cm which appears to extend and communicate with the open surgical wound. Stable 10 mm lingula nodule. Electronically Signed   By: Jerilynn Mages.  Shick M.D.   On: 02/23/2021 08:13   CT SHOULDER LEFT WO CONTRAST  Result Date: 02/26/2021 CLINICAL DATA:  Bilateral shoulder septic arthritis - L shoulder irrigation and debridement 6/16- R shoulder irrigation and debridement with distal clavicle resection 6/17- s/p bilateral shoulder irrigation and debridement with wound vac placement bilaterally 6/29 EXAM: CT OF THE UPPER LEFT EXTREMITY WITHOUT CONTRAST TECHNIQUE: Multidetector CT imaging of the upper left extremity was performed according to the standard protocol. COMPARISON:  Multiple exams, including 02/18/2021 FINDINGS: Bones/Joint/Cartilage Continued  complex effusion of the left glenohumeral joint with gas tracking in the subscapular recess and along the indistinct anterior joint margin. No bony destructive findings. Ligaments Suboptimally assessed by CT. Muscles and Tendons New gas and fluid tracks along the short head of the biceps in the shoulder region, concerning for abscess extension in this vicinity. Soft tissues New wound along the anterior shoulder compatible with interval incision and drainage, with wound VAC in place. Given the complex effusion and synovitis, continued infection in the glenohumeral joint is a possibility. Abnormal indistinctness of tissue planes between the subscapularis and chest wall compatible with inflammation extending in this vicinity. There is potentially some fluid in this region is well especially along the cephalad partial in such image 27 series 7. IMPRESSION: 1. Interval incision and drainage of the left shoulder with anterior wound with wound VAC noted. 2. Continued abnormal complex fluid and gas in the shoulder joint including the subscapular recess, continued infection is a distinct possibility. 3. Increased gas and fluid within along the short head of the biceps below the level of the acromion, compatible with abscess extension in this vicinity. 4. Effacement of soft tissue interfaces between the subscapularis and the ribs/intercostal muscle, with a small amount of gas in this space, compatible with abscess. Electronically Signed   By: Van Clines M.D.   On: 02/26/2021 12:41   CT SHOULDER LEFT WO CONTRAST  Result Date: 02/19/2021 CLINICAL DATA:  Septic arthritis suspected, shoulder, xray done EXAM: CT OF THE UPPER LEFT EXTREMITY WITHOUT CONTRAST TECHNIQUE: Multidetector CT imaging of the upper  left extremity was performed according to the standard protocol. COMPARISON:  Left shoulder CT 02/13/2021, left shoulder MRI 02/07/2021 FINDINGS: Bones/Joint/Cartilage No evidence of acute fracture. No new osseous  erosion, destruction, or periosteal reaction. Persistent gas containing distension of the subacromial-subdeltoid bursa and small glenohumeral joint effusion. Ligaments Suboptimally assessed by CT. Muscles and Tendons There is a new drain placed into a supraclavicular/lower left neck collection, which is significantly decreased in size. There is visible soft tissue gas and fluid noted posteriorly in the neck (axial images 30, 35, 39). Persistent gas/fluid collecting along the intercostal tissues, left axilla, and upper back deep soft tissues (for examples, see axial images 46, 73, and 57-58). - Left axillary collection measures 3.5 x 2.0 cm in the axial plane and 6.7 cm in craniocaudal extent (axial image 69, coronal image 62). -Additional left axillary collection measures 4.9 x 2.7 cm in the axial plane and 4.5 cm in craniocaudal extent (axial image 86, coronal image 49). There is increased gas and fluid collecting in the subcutaneous tissues anteriorly along the superficial surface of the anterior deltoid/pectoralis muscle (axial images 47-60), which may connect with the subacromial-subdeltoid bursa collection on axial soft tissue image 57. Soft tissues Persistent subcutaneous soft tissue swelling. There is a surgical drain along the left posterolateral soft tissues at the level of the inferior angle of the scapula, without well-defined collection in this location. There is a small left pleural effusion and left lower lung atelectasis. IMPRESSION: New drain placed into a supraclavicular collection, which is significantly decreased in size. Persistent extensive soft tissue abscess along the left chest wall/axilla, and visible gas/fluid collecting posteriorly along the lower neck and upper back deep soft tissues, reference images and measurements above. Persistent gas containing distension of the subacromial-subdeltoid bursa and small glenohumeral joint effusion. New fluid and gas collecting superficially in the  subcutaneous tissues along the surface of the anterior deltoid/distal pectoralis muscle, which may connect with the subacromial-subdeltoid bursa collection. No new acute osseous abnormality to suggest osteomyelitis. Electronically Signed   By: Maurine Simmering   On: 02/19/2021 09:08   CT SHOULDER LEFT WO CONTRAST  Result Date: 02/14/2021 CLINICAL DATA:  Septic arthritis suspected, shoulder, xray done septic arthritis left shoulder EXAM: CT OF THE UPPER LEFT EXTREMITY WITHOUT CONTRAST TECHNIQUE: Multidetector CT imaging of the upper left extremity was performed according to the standard protocol. COMPARISON:  CT 02/09/2021 FINDINGS: Bones/Joint/Cartilage There is no evidence of acute fracture. There is no new osseous erosion, destruction, or periosteal reaction. Persisting gas containing glenohumeral joint effusion and distention of the subacromial-subdeltoid bursa. Some of this gas/fluid is collecting in the subcoracoid recess. Ligaments Suboptimally assessed by CT. Muscles and Tendons There is persisting gas and fluid seen along the left upper chest wall/axilla, with most superior extent extending up the neck to the level of C6-C7 (axial image 3). There is persistent foci of intercostal gas (axial image 102/105). There is paraspinal muscle gas is well (axial image 82 and 51). Discrete abscesses are difficult to measure, with most well-defined area measuring 4.0 x 1.9 cm in the transverse plane by 7.5 cm in craniocaudal extent. Soft tissues As above. Left axillary lymphadenopathy. Small left pleural effusion. There is surgical material, likely packing along the left posterolateral soft tissues on axial image 112). IMPRESSION: Persistent, extensive abscess along the left chest wall/axilla, with visible gas/fluid extending superiorly in the neck to the level of C6/C7, and posteriorly to the paraspinal musculature. Most well-defined collection is within the axilla measuring 4.0 x 1.9  cm in the transverse plane and 7.5  cm in craniocaudal extent. Gas containing left shoulder joint effusion and distension of the subacromial-subdeltoid bursa, concerning for septic arthritis/bursitis, though these findings could in part be related to recent surgery. No new osseous abnormality to suggest osteomyelitis, though note CT is insensitive in detecting osteomyelitis. Electronically Signed   By: Maurine Simmering   On: 02/14/2021 07:34   CT SHOULDER RIGHT WO CONTRAST  Result Date: 02/26/2021 CLINICAL DATA:  Bilateral shoulder septic arthritis - L shoulder irrigation and debridement 6/16- R shoulder irrigation and debridement with distal clavicle resection 6/17- s/p bilateral shoulder irrigation and debridement with wound vac placement bilaterally 6/29 EXAM: CT OF THE UPPER RIGHT EXTREMITY WITHOUT CONTRAST TECHNIQUE: Multidetector CT imaging of the upper right extremity was performed according to the standard protocol. COMPARISON:  Right shoulder CT 02/18/2021 FINDINGS: Bones/Joint/Cartilage Prior distal clavicular resection. No current bony destructive findings. Continued complex material in the right glenohumeral joint. Ligaments Suboptimally assessed by CT. Muscles and Tendons Fluid could continues to surround the short head of the biceps tendon and proximal muscle. Much of this is just deep to the pectoralis major but no longer demonstrates the speckled gas density that was present in this fluid previously. Soft tissues Continued subcoracoid fluid extension just above the subscapularis. Anterior wound noted with wound VAC in place. IMPRESSION: 1. New anterior incision and drainage with wound VAC in place. There continues to be complex fluid in the glenohumeral joint and infection in the joint cannot be readily excluded on today's noncontrast exam. Continued fluid density in the subscapular recess. 2. Continued fluid tracking around the short head of the biceps and deep to the pectoralis musculature, although the gas in this fluid collection has  resolved. Electronically Signed   By: Van Clines M.D.   On: 02/26/2021 12:47   CT SHOULDER RIGHT WO CONTRAST  Result Date: 02/19/2021 CLINICAL DATA:  Septic arthritis suspected, shoulder, xray done EXAM: CT OF THE UPPER RIGHT EXTREMITY WITHOUT CONTRAST TECHNIQUE: Multidetector CT imaging of the upper right extremity was performed according to the standard protocol. COMPARISON:  CT 02/13/2021 FINDINGS: Bones/Joint/Cartilage Postsurgical changes along the right shoulder, including AC joint resection. There is persistent soft tissue fluid and gas anteriorly along the clavicular head of the pectoralis major, slightly increased from prior exam. There is no well-defined rim enhancing collection at this time. There is persistent small glenohumeral joint effusion and mild distension of the subacromial-subdeltoid bursa and subcoracoid bursa. No new osseous changes to suggest osteomyelitis. Ligaments Suboptimally assessed by CT. Muscles and Tendons Findings of full-thickness rotator cuff tear. Soft tissues Mild soft tissue swelling along the shoulder. No lymphadenopathy. Linear atelectasis in the right lower lobe. IMPRESSION: Postsurgical changes of right AC joint debridement/resection. Persistent ill-defined soft tissue gas and fluid anteriorly along the clavicular head of the pectoralis major, which is slightly increased from prior exam, which could be developing infection versus postsurgical change. Persistent small glenohumeral joint effusion, and mild distension of the subacromial-subdeltoid bursa and subcoracoid bursa. No new osseous changes to suggest osteomyelitis. Electronically Signed   By: Maurine Simmering   On: 02/19/2021 08:39   CT SHOULDER RIGHT WO CONTRAST  Result Date: 02/14/2021 CLINICAL DATA:  Septic arthritis suspected, shoulder, xray done septic arthritis right shoulder EXAM: CT OF THE UPPER RIGHT EXTREMITY WITHOUT CONTRAST TECHNIQUE: Multidetector CT imaging of the upper right extremity was  performed according to the standard protocol. COMPARISON:  Right shoulder MRI 02/07/2021, CT 02/01/2021 FINDINGS: Bones/Joint/Cartilage Postsurgical changes of the  right shoulder, including AC joint resection. There is soft tissue gas in fluid anteriorly, likely surgical related. There a small glenohumeral joint effusion and small amount of fluid in the subacromial-subdeltoid bursa. Ligaments Suboptimally assessed by CT. Muscles and Tendons Findings of full-thickness cuff tear. Soft tissues There is soft tissue swelling along the shoulder. In subcutaneous edema extending down the arm. IMPRESSION: Postsurgical changes at the right shoulder including resection at the [facility] joint. Soft tissue gas and fluid anteriorly, likely related to recent surgery. Small glenohumeral joint effusion with mild distention of the subacromial-subdeltoid bursa. There is soft tissue edema without no well-defined soft tissue abscess along the right shoulder. No new osseous changes to suggest osteomyelitis, though note CT is insensitive in detecting osteomyelitis. Electronically Signed   By: Maurine Simmering   On: 02/14/2021 07:44   MR SHOULDER LEFT W WO CONTRAST  Result Date: 03/08/2021 CLINICAL DATA:  Left shoulder septic arthritis. Left shoulder incision and debridement 1 day ago EXAM: MRI OF THE LEFT SHOULDER WITHOUT AND WITH CONTRAST TECHNIQUE: Multiplanar, multisequence MR imaging of the left shoulder was performed before and after the administration of intravenous contrast. CONTRAST:  40m GADAVIST GADOBUTROL 1 MMOL/ML IV SOLN COMPARISON:  CT 02/26/2021, MRI 02/06/2021 FINDINGS: Technical note: Despite efforts by the technologist and patient, motion artifact is present on today's exam and could not be eliminated. This reduces exam sensitivity and specificity. Bones/Joint/Cartilage Moderate sized glenohumeral joint effusion with enhancing synovitis. Surgical soft tissue defect overlying the anterior shoulder with direct communication to  the subscapularis joint recess and glenohumeral joint space (series 12, images 12-18). New marrow edema and enhancement with loss of cortical definition and subcortical low T1 signal changes of the humeral head in the region of the lesser tuberosity suggests osteomyelitis (series 10, images 13-14). Patchy marrow edema within the greater tuberosity has slightly progressed compared to the prior MRI and may reflect reactive osteitis or early acute osteomyelitis (series 6, images 15-16). Small enhancing joint effusion of the acromioclavicular joint with subchondral marrow edema within the distal clavicle. Ligaments Grossly intact. Muscles and Tendons Intramuscular edema most pronounced within the supraspinatus, infraspinatus, and anterior deltoid muscles, likely reflecting myositis. Full-thickness retracted tears of the supraspinatus and infraspinatus tendons. Subscapularis tendinosis. Torn long head biceps tendon. Soft tissues Periarticular soft tissue edema. No organized fluid collections within the soft tissues. Large surgical defect at the anterior aspect of the shoulder. IMPRESSION: 1. Surgical soft tissue defect overlying the anterior shoulder with enhancing tract communicating with the subscapularis joint recess and glenohumeral joint space. Moderate sized glenohumeral joint effusion with enhancing synovitis suggesting septic arthritis. 2. New marrow signal changes of the humeral head in the region of the lesser tuberosity compatible with acute osteomyelitis. 3. Patchy marrow edema within the greater tuberosity has slightly progressed compared to the prior MRI and may reflect reactive osteitis or early acute osteomyelitis. 4. Full-thickness retracted tears of the supraspinatus and infraspinatus tendons. 5. Intramuscular edema most pronounced within the supraspinatus, infraspinatus, and anterior deltoid muscles, likely myositis. No organized fluid collections. These results will be called to the ordering clinician  or representative by the Radiologist Assistant, and communication documented in the PACS or [employer] Electronically Signed   By: NDavina PokeD.O.   On: 03/08/2021 08:14   CT MAXILLOFACIAL W CONTRAST  Result Date: 02/26/2021 CLINICAL DATA:  Periapical lucencies EXAM: CT MAXILLOFACIAL WITH CONTRAST TECHNIQUE: Multidetector CT imaging of the maxillofacial structures was performed with intravenous contrast. Multiplanar CT image reconstructions were also generated. CONTRAST:  59m OMNIPAQUE IOHEXOL 300 MG/ML  SOLN COMPARISON:  Chest CT 02/26/2021 FINDINGS: Osseous: There are periapical lucencies of teeth 23, 24, 26, 27 and 28. There is erosion of the anterior mandibular cortex without subperiosteal abscess. Orbits: Negative. No traumatic or inflammatory finding. Sinuses: Clear Soft tissues: Normal Limited intracranial: Unremarkable IMPRESSION: Periapical lucencies of teeth 23, 24, 26, 27 and 28. There is erosion of the anterior mandibular cortex without subperiosteal abscess. Electronically Signed   By: KUlyses JarredM.D.   On: 02/26/2021 21:41   DG CHEST PORT 1 VIEW  Result Date: 02/13/2021 CLINICAL DATA:  Respiratory compromise EXAM: PORTABLE CHEST 1 VIEW COMPARISON:  02/10/2021 FINDINGS: Low lung volumes. Hazy atelectasis at the bases. Small left pleural effusion. Stable cardiomediastinal silhouette. No pneumothorax. IMPRESSION: Low lung volumes with small left effusion and hazy atelectasis at the bases Electronically Signed   By: KDonavan FoilM.D.   On: 02/13/2021 23:28   CT IMAGE GUIDED DRAINAGE BY PERCUTANEOUS CATHETER  Result Date: 02/16/2021 INDICATION: History of disseminated MSSA infection, now with bilateral paraspinal abscesses. Please perform CT-guided aspiration and/or drainage catheter placement for infection source control purposes. EXAM: CT GUIDED RETROPERITONEAL ABSCESS DRAINAGE CATHETER PLACEMENT X2 COMPARISON:  Normal lumbar spine MRI-02/15/2021 MEDICATIONS: The patient is  currently admitted to the hospital and receiving intravenous antibiotics. The antibiotics were administered within an appropriate time frame prior to the initiation of the procedure. ANESTHESIA/SEDATION: Moderate (conscious) sedation was employed during this procedure. A total of Versed 2 mg and Fentanyl 50 mcg was administered intravenously. Moderate Sedation Time: 14 minutes. The patient's level of consciousness and vital signs were monitored continuously by radiology nursing throughout the procedure under my direct supervision. CONTRAST:  None COMPLICATIONS: None immediate. PROCEDURE: Informed written consent was obtained from the patient after a discussion of the risks, benefits and alternatives to treatment. The patient was placed prone on the CT gantry and a pre procedural CT was performed re-demonstrating the known abscess/fluid collections within the bilateral iliopsoas musculature with dominant right-sided collection measuring at least 3.5 x 3.0 cm and dominant left-sided component measuring at least 2.5 x 1.7 cm (image 37, series 2). The procedure was planned. A timeout was performed prior to the initiation of the procedure. The skin overlying the operative site was prepped and draped in the usual sterile fashion. The overlying soft tissues were anesthetized with 1% lidocaine with epinephrine. Appropriate trajectory was planned with the use of 22 gauge spinal needles 18 gauge trocar needles were advanced into both retroperitoneal abscesses/fluid collections. Small amount of purulent material was aspirated from both collections in short Amplatz wires were coiled within each collection. Appropriate positioning was confirmed with a limited CT scan. The tract was serially dilated allowing placement of a 10 French all-purpose drainage catheter bilaterally. Appropriate positioning was confirmed with a limited postprocedural CT scan. Approximately 35 cc of purulent fluid was aspirated from the right-sided  iliopsoas abscess while approximately 5 cc of bloody fluid was aspirated from the left-sided iliopsoas abscess. Aspirated fluid from the right-sided collection was capped and sent to the laboratory for analysis. Both drainage catheters were connected to JP bulbs and sutured in place. Dressings were applied. The patient tolerated the procedure well without immediate post procedural complication. IMPRESSION: 1. Successful CT guided placement of a 10 [place] purpose drain catheter into the dominant right-sided retroperitoneal abscess with aspiration of 35 mL of purulent fluid. Samples were sent to the laboratory as requested by the ordering clinical team. 2. Successful CT-guided placement of a 10  French percutaneous drainage catheter into dominant left-sided retroperitoneal abscess with aspiration of approximately 5 cc of bloody fluid. Electronically Signed   By: Sandi Mariscal M.D.   On: 02/16/2021 07:52   CT IMAGE GUIDED DRAINAGE BY PERCUTANEOUS CATHETER  Result Date: 02/16/2021 INDICATION: History of disseminated MSSA infection, now with bilateral paraspinal abscesses. Please perform CT-guided aspiration and/or drainage catheter placement for infection source control purposes. EXAM: CT GUIDED RETROPERITONEAL ABSCESS DRAINAGE CATHETER PLACEMENT X2 COMPARISON:  Normal lumbar spine MRI-02/15/2021 MEDICATIONS: The patient is currently admitted to the hospital and receiving intravenous antibiotics. The antibiotics were administered within an appropriate time frame prior to the initiation of the procedure. ANESTHESIA/SEDATION: Moderate (conscious) sedation was employed during this procedure. A total of Versed 2 mg and Fentanyl 50 mcg was administered intravenously. Moderate Sedation Time: 14 minutes. The patient's level of consciousness and vital signs were monitored continuously by radiology nursing throughout the procedure under my direct supervision. CONTRAST:  None COMPLICATIONS: None immediate. PROCEDURE:  Informed written consent was obtained from the patient after a discussion of the risks, benefits and alternatives to treatment. The patient was placed prone on the CT gantry and a pre procedural CT was performed re-demonstrating the known abscess/fluid collections within the bilateral iliopsoas musculature with dominant right-sided collection measuring at least 3.5 x 3.0 cm and dominant left-sided component measuring at least 2.5 x 1.7 cm (image 37, series 2). The procedure was planned. A timeout was performed prior to the initiation of the procedure. The skin overlying the operative site was prepped and draped in the usual sterile fashion. The overlying soft tissues were anesthetized with 1% lidocaine with epinephrine. Appropriate trajectory was planned with the use of 22 gauge spinal needles 18 gauge trocar needles were advanced into both retroperitoneal abscesses/fluid collections. Small amount of purulent material was aspirated from both collections in short Amplatz wires were coiled within each collection. Appropriate positioning was confirmed with a limited CT scan. The tract was serially dilated allowing placement of a 10 French all-purpose drainage catheter bilaterally. Appropriate positioning was confirmed with a limited postprocedural CT scan. Approximately 35 cc of purulent fluid was aspirated from the right-sided iliopsoas abscess while approximately 5 cc of bloody fluid was aspirated from the left-sided iliopsoas abscess. Aspirated fluid from the right-sided collection was capped and sent to the laboratory for analysis. Both drainage catheters were connected to JP bulbs and sutured in place. Dressings were applied. The patient tolerated the procedure well without immediate post procedural complication. IMPRESSION: 1. Successful CT guided placement of a 10 [place] all purpose drain catheter into the dominant right-sided retroperitoneal abscess with aspiration of 35 mL of purulent fluid. Samples were sent  to the laboratory as requested by the ordering clinical team. 2. Successful CT-guided placement of a 10 French percutaneous drainage catheter into dominant left-sided retroperitoneal abscess with aspiration of approximately 5 cc of bloody fluid. Electronically Signed   By: Sandi Mariscal M.D.   On: 02/16/2021 07:52   [place] IMAGE GUIDED FLUID DRAIN BY CATHETER  Result Date: 02/15/2021 INDICATION: History of multifocal MSSA infection with indeterminate fluid collection with the left supraclavicular fossa. Please perform ultrasound-guided aspiration and/or drainage catheter placement as indicated. EXAM: ULTRASOUND-GUIDED DRAINAGE CATHETER PLACEMENT INTO ABSCESS WITHIN THE LEFT SUPRACLAVICULAR FOSSA COMPARISON:  Chest CT-02/13/2021; 02/09/2021; 02/01/2021 MEDICATIONS: The patient is currently admitted to the hospital and receiving intravenous antibiotics. The antibiotics were administered within an appropriate time frame prior to the initiation of the procedure. ANESTHESIA/SEDATION: Moderate (conscious) sedation was employed during this procedure.  A total of Versed 1.5 mg and Fentanyl 50 mcg was administered intravenously. Moderate Sedation Time: 14 minutes. The patient's level of consciousness and vital signs were monitored continuously by radiology nursing throughout the procedure under my direct supervision. CONTRAST:  None COMPLICATIONS: None immediate. PROCEDURE: Informed written consent was obtained from the patient after a discussion of the risks, benefits and alternatives to treatment. Patient was positioned slightly upper right on his hospital bed and sonographic evaluation was performed of the left supraclavicular fossa demonstrating a an indeterminate fluid collection compatible with the findings seen on preceding chest CT. The procedure was planned. A timeout was performed prior to the initiation of the procedure. The left supraclavicular fossa was prepped and draped in the usual sterile fashion. The overlying  soft tissues were anesthetized with 1% lidocaine with epinephrine. Appropriate trajectory was planned with the use of a 22 gauge spinal needle. An 18 gauge trocar needle was advanced into the abscess/fluid collection and a short Amplatz super stiff wire was coiled within the collection. Appropriate positioning was confirmed with a limited CT scan. The tract was serially dilated allowing placement of a 10 [place] all-purpose drainage catheter. Appropriate positioning was confirmed with a limited postprocedural CT scan. Next, approximately 40 cc of bloody fluid was aspirated. The tube was connected to a JP bulb and sutured in place. A dressing was applied. The patient tolerated the procedure well without immediate post procedural complication. IMPRESSION: Successful ultrasound guided placement of a 10 French all purpose drain catheter into the left supraclavicular abscess with aspiration of 40 mL of bloody fluid. Samples were sent to the laboratory as requested by the ordering clinical team. Electronically Signed   By: Sandi Mariscal M.D.   On: 02/15/2021 17:01   [place] EKG SITE RITE  Result Date: 03/13/2021 If Site Rite image not attached, placement could not be confirmed due to current cardiac rhythm.   Microbiology: Recent Results (from the past 240 hour(s))  Surgical pcr screen     Status: None   Collection Time: 03/06/21  3:07 AM   Specimen: Nasal Mucosa; Nasal Swab  Result Value Ref Range Status   MRSA, PCR NEGATIVE NEGATIVE Final   Staphylococcus aureus NEGATIVE NEGATIVE Final    Comment: (NOTE) The Xpert SA Assay (FDA approved for NASAL specimens in patients 27 years of age and older), is one component of a comprehensive surveillance program. It is not intended to diagnose infection nor to guide or monitor treatment. Performed at [facility], [place] [address], [place], [place] [address]      Labs: Basic Metabolic Panel: Recent Labs  Lab 03/09/21 0359 03/12/21 0320  03/15/21 0327  NA 132* 132* 135  K 4.3 4.0 4.0  CL 95* 96* 99  CO2 '29 27 27  ' GLUCOSE 327* 135* 93  BUN 18 21* 17  CREATININE 0.46* 0.41* 0.47*  CALCIUM 8.3* 8.6* 9.1   Liver Function Tests: No results for input(s): AST, ALT, ALKPHOS, BILITOT, PROT, ALBUMIN in the last 168 hours. No results for input(s): LIPASE, AMYLASE in the last 168 hours. No results for input(s): AMMONIA in the last 168 hours. CBC: Recent Labs  Lab 03/11/21 0255 03/12/21 0320 03/13/21 0335 03/14/21 0307 03/15/21 0327  WBC 11.4* 12.1* 7.9 13.1* 9.7  NEUTROABS 7.5 8.4* 6.2 9.2* 6.2  HGB 8.0* 8.4* 8.5* 7.9* 8.9*  HCT 25.3* 27.0* 27.4* 25.0* 28.2*  MCV 90.4 90.9 90.7 91.6 89.2  PLT 521* 565* 555* 512* 556*   Cardiac Enzymes: No results for  input(s): CKTOTAL, CKMB, CKMBINDEX, TROPONINI in the last 168 hours. BNP: BNP (last 3 results) No results for input(s): BNP in the last 8760 hours.  ProBNP (last 3 results) No results for input(s): PROBNP in the last 8760 hours.  CBG: Recent Labs  Lab 03/14/21 1124 03/14/21 1612 03/14/21 2056 03/15/21 0751 03/15/21 1125  GLUCAP 130* 129* 173* 96 210*       Signed:  Irine Seal MD.  [facility] 03/15/2021, 3:09 PM

## 2021-03-15 NOTE — TOC Transition Note (Signed)
Transition of Care [facility]) - CM/SW Discharge Note   Patient Details  Name: John Ware MRN: [id number] Date of Birth: April 07, 1966  Transition of Care [facility]) CM/SW Contact:  Lanier Clam, RN Phone Number: 03/15/2021, 11:58 AM   Clinical Narrative:  See prior note for additional info.   [employer] rep Pam for iv home infusion-she has spoken to spouse to set up initial home infusion visit here in the hospital today-12:30-1p. They will provide the med,& supplies;[facility] nursing will do ongoing instruction @ home arranged by [facility]-MD please add I comments to [facility] order specific wound orders-u can copy paste-need HHRN-[facility] flush,labs,dsg changes,wound care. Patient agree to otpt PT-already made referral.He declined any dme-has shower chair. Noted patient insurance only had 1 HHC agency in [facility] rep [place]-unable to accept.family to transport home on own. No further Case mgmnt needs.   Final next level of care: Home w Home Health Services Barriers to Discharge: No Barriers Identified   Patient Goals and CMS Choice Patient states their goals for this hospitalization and ongoing recovery are:: go home CMS Medicare.gov Compare Post Acute Care list provided to:: Patient Choice offered to / list presented to : Patient  Discharge Placement                       Discharge Plan and Services   Discharge Planning Services: CM Consult Post Acute Care Choice: Home Health                    HH Arranged: IV Antibiotics HH Agency: [facility] Date HH Agency Contacted: 03/14/21 Time HH Agency Contacted: 1419 Representative spoke with at [facility] Agency: Pam  Social Determinants of Health (SDOH) Interventions     Readmission Risk Interventions Readmission Risk Prevention Plan 02/16/2021  Transportation Screening Complete  PCP or Specialist Appt within 3-5 Days Complete  HRI or Home Care Consult Complete  Social Work Consult for Recovery Care Planning/Counseling Complete  Palliative  Care Screening Complete  Medication Review Oceanographer) Complete  Some recent data might be hidden

## 2021-03-15 NOTE — Progress Notes (Signed)
Nutrition Follow-up  DOCUMENTATION CODES:   Not applicable  INTERVENTION:  - continue 30 ml Prosource Plus QID, Ensure Enlive BID, and 1 packet Juven BID.  - weigh patient today.   NUTRITION DIAGNOSIS:   Increased nutrient needs related to acute illness, wound healing as evidenced by estimated needs. -ongoing  GOAL:   Patient will meet greater than or equal to 90% of their needs -met on average  MONITOR:   PO intake, Supplement acceptance, Labs, Weight trends, Skin, I & O's   ASSESSMENT:   55 year old male with medical history of DM, HTN, depression, and chronic back pain on chronic narcotics 2/2 spinal stenosis. He presented to the ED d/t generalized malaise x3 weeks. He tested positive for COVID at home on 01/12/21 and presented to the ED on 5/28 after falling out of a chair and having subsequent shoulder pain. Imaging was negative so he was given a sling for comfort. He returned to the ED on 6/8 d/t inability to walk. 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 CT chest showed large L lateral chest injury  Recently documented meal intakes: 7/19- 100% of all meals (total of 1859 kcal and 82 grams protein) 7/20- 100% of breakfast, 100% of lunch, and 75% of dinner (total of 1799 kcal and 87 grams protein) 7/21- 100% of breakfast (729 kcal and 29 grams protein)   Per review of orders he has been accepting Juven 100% of the time offered, Prosource 90% of the time offered, and Ensure 75% of the time offered.  If he consumes 100% of the supplements he does accept, he is consuming 895 kcal and 62 grams/day from oral nutrition supplements.    He has not been weighed since 7/5. Non-pitting edema to BUE, mild pitting edema to BLE documented in the edema section of flow sheet.  Since last RD assessment, he went to the OR on 7/18 for L shoulder excisional debridement, glenohumeral joint, deep abscess, subacromial space.  Ortho note from 7/20  states that patient is ready to d/c from an Ortho standpoint.  Plan is for home with Home Health at time of d/c.   Labs reviewed; CBG: 96 mg/dl, creatinine: 0.47 mg/dl. Medications reviewed; 325 mg ferrous sulfate BID, sliding scale novolog, 25 units lantus/day, 17 g miralax/day, 1 tablet senokot BID, 220 mg zinc sulfate/day.     Diet Order:   Diet Order             Diet Carb Modified Fluid consistency: Thin; Room service appropriate? Yes  Diet effective now                   EDUCATION NEEDS:   Education needs have been addressed  Skin:  Skin Assessment: Skin Integrity Issues: Skin Integrity Issues:: Incisions, Stage II Stage II: bilateral buttocks Wound Vac: bilateral shoulders Incisions: L chest (6/18 and 6/28); R shoulder (6/16, 18, and 6/28); L shoulder (6/28 and 7/18); back (6/28)  Last BM:  7/16  Height:   Ht Readings from Last 1 Encounters:  02/20/21 5' 10" (1.778 m)    Weight:   Wt Readings from Last 1 Encounters:  02/27/21 93 kg     Estimated Nutritional Needs:  Kcal:  2500-2700 kcal Protein:  150-170 grams Fluid:  >/= 2.5 L/day     Jarome Matin, MS, RD, LDN, CNSC Inpatient Clinical Dietitian RD pager # available in [facility]  After hours/weekend pager # available in [facility]

## 2021-03-16 ENCOUNTER — Telehealth: Payer: Self-pay

## 2021-03-16 NOTE — Telephone Encounter (Signed)
Transition Care Management Follow-up Telephone Call Date of discharge and from where: 03/15/21 [facility] Diagnosis: DKA, Sepsis How have you been since you were released from the hospital? Much better Any questions or concerns? No  Items Reviewed: Did the pt receive and understand the discharge instructions provided? Yes  Medications obtained and verified? Yes  Other? No  Any new allergies since your discharge? No  Dietary orders reviewed? Yes Do you have support at home? Yes   Home Care and Equipment/Supplies: Were home health services ordered? yes If so, what is the name of the agency? He is unsure - his insurance wouldn't cover [facility]  Has the agency set up a time to come to the patient's home? no Were any new equipment or medical supplies ordered?  Yes: 3 in 1 What is the name of the medical supply agency? unkown Were you able to get the supplies/equipment? yes Do you have any questions related to the use of the equipment or supplies? No  Functional Questionnaire: (I = Independent and D = Dependent) ADLs: I  Bathing/Dressing- I  Meal Prep- I  Eating- I  Maintaining continence- I  Transferring/Ambulation- I  Managing Meds- I  Follow up appointments reviewed:  PCP Hospital f/u appt confirmed? Yes  Scheduled to see Jannifer Rodney on 03/20/21 @ 9:25. Specialist Hospital f/u appt confirmed?  Not yet - he has to call infectious disease, surgeon, ortho   Are transportation arrangements needed? No  If their condition worsens, is the pt aware to call PCP or go to the Emergency Dept.? Yes Was the patient provided with contact information for the PCP's office or ED? Yes Was to pt encouraged to call back with questions or concerns? Yes

## 2021-03-20 ENCOUNTER — Encounter: Payer: Self-pay | Admitting: Family

## 2021-03-20 ENCOUNTER — Other Ambulatory Visit: Payer: Self-pay

## 2021-03-20 ENCOUNTER — Ambulatory Visit (INDEPENDENT_AMBULATORY_CARE_PROVIDER_SITE_OTHER): Payer: Commercial Managed Care - PPO | Admitting: Family

## 2021-03-20 ENCOUNTER — Encounter: Payer: Self-pay | Admitting: Physical Therapy

## 2021-03-20 ENCOUNTER — Ambulatory Visit: Payer: Commercial Managed Care - PPO | Attending: Internal Medicine | Admitting: Physical Therapy

## 2021-03-20 VITALS — BP 117/77 | HR 94 | Temp 98.4°F | Ht 70.0 in | Wt 189.4 lb

## 2021-03-20 DIAGNOSIS — K6812 Psoas muscle abscess: Secondary | ICD-10-CM

## 2021-03-20 DIAGNOSIS — E1169 Type 2 diabetes mellitus with other specified complication: Secondary | ICD-10-CM | POA: Diagnosis not present

## 2021-03-20 DIAGNOSIS — Z09 Encounter for follow-up examination after completed treatment for conditions other than malignant neoplasm: Secondary | ICD-10-CM

## 2021-03-20 DIAGNOSIS — M6281 Muscle weakness (generalized): Secondary | ICD-10-CM

## 2021-03-20 DIAGNOSIS — R2681 Unsteadiness on feet: Secondary | ICD-10-CM

## 2021-03-20 DIAGNOSIS — I10 Essential (primary) hypertension: Secondary | ICD-10-CM | POA: Diagnosis not present

## 2021-03-20 DIAGNOSIS — F339 Major depressive disorder, recurrent, unspecified: Secondary | ICD-10-CM

## 2021-03-20 DIAGNOSIS — L02213 Cutaneous abscess of chest wall: Secondary | ICD-10-CM

## 2021-03-20 LAB — CBC WITH DIFFERENTIAL/PLATELET
Basophils Absolute: 0.1 10*3/uL (ref 0.0–0.2)
Basos: 1 %
EOS (ABSOLUTE): 0.4 10*3/uL (ref 0.0–0.4)
Eos: 3 %
Hematocrit: 29 % — ABNORMAL LOW (ref 37.5–51.0)
Hemoglobin: 9.7 g/dL — ABNORMAL LOW (ref 13.0–17.7)
Immature Grans (Abs): 0 10*3/uL (ref 0.0–0.1)
Immature Granulocytes: 0 %
Lymphocytes Absolute: 2.4 10*3/uL (ref 0.7–3.1)
Lymphs: 20 %
MCH: 27.9 pg (ref 26.6–33.0)
MCHC: 33.4 g/dL (ref 31.5–35.7)
MCV: 83 fL (ref 79–97)
Monocytes Absolute: 1.2 10*3/uL — ABNORMAL HIGH (ref 0.1–0.9)
Monocytes: 11 %
Neutrophils Absolute: 7.8 10*3/uL — ABNORMAL HIGH (ref 1.4–7.0)
Neutrophils: 65 %
Platelets: 499 10*3/uL — ABNORMAL HIGH (ref 150–450)
RBC: 3.48 x10E6/uL — ABNORMAL LOW (ref 4.14–5.80)
RDW: 14.4 % (ref 11.6–15.4)
WBC: 11.8 10*3/uL — ABNORMAL HIGH (ref 3.4–10.8)

## 2021-03-20 LAB — CMP14+EGFR
ALT: 12 IU/L (ref 0–44)
AST: 14 IU/L (ref 0–40)
Albumin/Globulin Ratio: 1 — ABNORMAL LOW (ref 1.2–2.2)
Albumin: 3.9 g/dL (ref 3.8–4.9)
Alkaline Phosphatase: 135 IU/L — ABNORMAL HIGH (ref 44–121)
BUN/Creatinine Ratio: 30 — ABNORMAL HIGH (ref 9–20)
BUN: 14 mg/dL (ref 6–24)
Bilirubin Total: <0.2 mg/dL (ref 0.0–1.2)
CO2: 25 mmol/L (ref 20–29)
Calcium: 9.9 mg/dL (ref 8.7–10.2)
Chloride: 99 mmol/L (ref 96–106)
Creatinine, Ser: 0.46 mg/dL — ABNORMAL LOW (ref 0.76–1.27)
Globulin, Total: 3.9 g/dL (ref 1.5–4.5)
Glucose: 153 mg/dL — ABNORMAL HIGH (ref 65–99)
Potassium: 5.3 mmol/L — ABNORMAL HIGH (ref 3.5–5.2)
Sodium: 139 mmol/L (ref 134–144)
Total Protein: 7.8 g/dL (ref 6.0–8.5)
eGFR: 125 mL/min/{1.73_m2} (ref >59)

## 2021-03-20 LAB — BAYER DCA HB A1C WAIVED: HB A1C (BAYER DCA - WAIVED): 6.9 % (ref <7.0)

## 2021-03-20 MED ORDER — FREESTYLE LIBRE 2 SENSOR MISC: 11 refills | Status: DC

## 2021-03-20 MED ORDER — METHOCARBAMOL 500 MG PO TABS: 500.0000 mg | ORAL_TABLET | Freq: Two times a day (BID) | ORAL | 2 refills | Status: DC | PRN

## 2021-03-20 NOTE — Patient Instructions (Signed)
Diabetes Mellitus and Nutrition, Adult When you have diabetes, or diabetes mellitus, it is very important to have healthy eating habits because your blood sugar (glucose) levels are greatly affected by what you eat and drink. Eating healthy foods in the right amounts, at about the same times every day, can help you:  Control your blood glucose.  Lower your risk of heart disease.  Improve your blood pressure.  Reach or maintain a healthy weight. What can affect my meal plan? Every person with diabetes is different, and each person has different needs for a meal plan. Your health care provider may recommend that you work with a dietitian to make a meal plan that is best for you. Your meal plan may vary depending on factors such as:  The calories you need.  The medicines you take.  Your weight.  Your blood glucose, blood pressure, and cholesterol levels.  Your activity level.  Other health conditions you have, such as heart or kidney disease. How do carbohydrates affect me? Carbohydrates, also called carbs, affect your blood glucose level more than any other type of food. Eating carbs naturally raises the amount of glucose in your blood. Carb counting is a method for keeping track of how many carbs you eat. Counting carbs is important to keep your blood glucose at a healthy level, especially if you use insulin or take certain oral diabetes medicines. It is important to know how many carbs you can safely have in each meal. This is different for every person. Your dietitian can help you calculate how many carbs you should have at each meal and for each snack. How does alcohol affect me? Alcohol can cause a sudden decrease in blood glucose (hypoglycemia), especially if you use insulin or take certain oral diabetes medicines. Hypoglycemia can be a life-threatening condition. Symptoms of hypoglycemia, such as sleepiness, dizziness, and confusion, are similar to symptoms of having too much  alcohol.  Do not drink alcohol if: ? Your health care provider tells you not to drink. ? You are pregnant, may be pregnant, or are planning to become pregnant.  If you drink alcohol: ? Do not drink on an empty stomach. ? Limit how much you use to:  0-1 drink a day for women.  0-2 drinks a day for men. ? Be aware of how much alcohol is in your drink. In the [place] one drink equals one 12 oz bottle of beer (355 mL), one 5 oz glass of wine (148 mL), or one 1 oz glass of hard liquor (44 mL). ? Keep yourself hydrated with water, diet soda, or unsweetened iced tea.  Keep in mind that regular soda, juice, and other mixers may contain a lot of sugar and must be counted as carbs. What are tips for following this plan? Reading food labels  Start by checking the serving size on the "Nutrition Facts" label of packaged foods and drinks. The amount of calories, carbs, fats, and other nutrients listed on the label is based on one serving of the item. Many items contain more than one serving per package.  Check the total grams (g) of carbs in one serving. You can calculate the number of servings of carbs in one serving by dividing the total carbs by 15. For example, if a food has 30 g of total carbs per serving, it would be equal to 2 servings of carbs.  Check the number of grams (g) of saturated fats and trans fats in one serving. Choose foods that have   a low amount or none of these fats.  Check the number of milligrams (mg) of salt (sodium) in one serving. Most people should limit total sodium intake to less than 2,300 mg per day.  Always check the nutrition information of foods labeled as "low-fat" or "nonfat." These foods may be higher in added sugar or refined carbs and should be avoided.  Talk to your dietitian to identify your daily goals for nutrients listed on the label. Shopping  Avoid buying canned, pre-made, or processed foods. These foods tend to be high in fat, sodium, and added  sugar.  Shop around the outside edge of the grocery store. This is where you will most often find fresh fruits and vegetables, bulk grains, fresh meats, and fresh dairy. Cooking  Use low-heat cooking methods, such as baking, instead of high-heat cooking methods like deep frying.  Cook using healthy oils, such as olive, canola, or sunflower oil.  Avoid cooking with butter, cream, or high-fat meats. Meal planning  Eat meals and snacks regularly, preferably at the same times every day. Avoid going long periods of time without eating.  Eat foods that are high in fiber, such as fresh fruits, vegetables, beans, and whole grains. Talk with your dietitian about how many servings of carbs you can eat at each meal.  Eat 4-6 oz (112-168 g) of lean protein each day, such as lean meat, chicken, fish, eggs, or tofu. One ounce (oz) of lean protein is equal to: ? 1 oz (28 g) of meat, chicken, or fish. ? 1 egg. ?  cup (62 g) of tofu.  Eat some foods each day that contain healthy fats, such as avocado, nuts, seeds, and fish.   What foods should I eat? Fruits Berries. Apples. Oranges. Peaches. Apricots. Plums. Grapes. Mango. Papaya. Pomegranate. Kiwi. Cherries. Vegetables Lettuce. Spinach. Leafy greens, including kale, chard, collard greens, and mustard greens. Beets. Cauliflower. Cabbage. Broccoli. Carrots. Green beans. Tomatoes. Peppers. Onions. Cucumbers. Brussels sprouts. Grains Whole grains, such as whole-wheat or whole-grain bread, crackers, tortillas, cereal, and pasta. Unsweetened oatmeal. Quinoa. Brown or wild rice. Meats and other proteins Seafood. Poultry without skin. Lean cuts of poultry and beef. Tofu. Nuts. Seeds. Dairy Low-fat or fat-free dairy products such as milk, yogurt, and cheese. The items listed above may not be a complete list of foods and beverages you can eat. Contact a dietitian for more information. What foods should I avoid? Fruits Fruits canned with  syrup. Vegetables Canned vegetables. Frozen vegetables with butter or cream sauce. Grains Refined white flour and flour products such as bread, pasta, snack foods, and cereals. Avoid all processed foods. Meats and other proteins Fatty cuts of meat. Poultry with skin. Breaded or fried meats. Processed meat. Avoid saturated fats. Dairy Full-fat yogurt, cheese, or milk. Beverages Sweetened drinks, such as soda or iced tea. The items listed above may not be a complete list of foods and beverages you should avoid. Contact a dietitian for more information. Questions to ask a health care provider  Do I need to meet with a diabetes educator?  Do I need to meet with a dietitian?  What number can I call if I have questions?  When are the best times to check my blood glucose? Where to find more information:  American Diabetes Association: diabetes.org  Academy of Nutrition and Dietetics: www.eatright.org  National Institute of Diabetes and Digestive and Kidney Diseases: www.niddk.nih.gov  Association of Diabetes Care and Education Specialists: www.diabeteseducator.org Summary  It is important to have healthy eating   habits because your blood sugar (glucose) levels are greatly affected by what you eat and drink.  A healthy meal plan will help you control your blood glucose and maintain a healthy lifestyle.  Your health care provider may recommend that you work with a dietitian to make a meal plan that is best for you.  Keep in mind that carbohydrates (carbs) and alcohol have immediate effects on your blood glucose levels. It is important to count carbs and to use alcohol carefully. This information is not intended to replace advice given to you by your health care provider. Make sure you discuss any questions you have with your health care provider. Document Revised: 07/20/2019 Document Reviewed: 07/20/2019 Elsevier Patient Education  2021 Elsevier Inc.  

## 2021-03-20 NOTE — Therapy (Signed)
[facility] [address] [place], [place], [address] Phone: [phone]   Fax:  [phone]  Physical Therapy Evaluation  Patient Details  Name: John Ware MRN: [id number] Date of Birth: Oct 24, 1965 Referring Provider (PT): Ramiro Harvest MD.(in hospital); Jannifer Rodney FNP primary   Encounter Date: 03/20/2021   PT End of Session - 03/20/21 1125     Visit Number 1    Number of Visits 16    Date for PT Re-Evaluation 05/15/21    Authorization Type [employer]    PT Start Time 1125    PT Stop Time 1203    PT Time Calculation (min) 38 min    Activity Tolerance Patient tolerated treatment well    Behavior During Therapy WFL for tasks assessed/performed             Past Medical History:  Diagnosis Date   Depression    Diabetes mellitus without complication (HCC)    Hypertension    Spinal stenosis    With Neurogenic Claudication    Past Surgical History:  Procedure Laterality Date   CARPAL TUNNEL RELEASE Bilateral    INCISION AND DRAINAGE ABSCESS Left 03/12/2021   Procedure: INCISION AND DRAINAGE ABSCESS;  Surgeon: [name], MD;  Location: [facility];  Service: Orthopedics;  Laterality: Left;   IR US GUIDE BX ASP/DRAIN  02/02/2021   IRRIGATION AND DEBRIDEMENT ABSCESS Left 02/08/2021   Procedure: MINOR INCISION AND DRAINAGE OF ABSCESS;  Surgeon: [name], MD;  Location: [facility];  Service: Orthopedics;  Laterality: Left;   IRRIGATION AND DEBRIDEMENT ABSCESS Left 02/10/2021   Procedure: IRRIGATION AND DEBRIDEMENT ABSCESS LEFT LATERAL CHEST WALL;  Surgeon: [name], MD;  Location: [facility];  Service: General;  Laterality: Left;   IRRIGATION AND DEBRIDEMENT ABSCESS Bilateral 02/20/2021   Procedure: MINOR INCISION AND DRAINAGE OF ABSCESS/WITH BILATERAL WOUND VAC PLACEMENT;  Surgeon: [name], MD;  Location: [facility];  Service: Orthopedics;  Laterality: Bilateral;   IRRIGATION AND DEBRIDEMENT SHOULDER Bilateral 02/28/2021    Procedure: IRRIGATION AND DEBRIDEMENT SHOULDER;  Surgeon: [name], MD;  Location: [facility];  Service: Orthopedics;  Laterality: Bilateral;   IRRIGATION AND DEBRIDEMENT SHOULDER Left 03/06/2021   Procedure: IRRIGATION AND DEBRIDEMENT SHOULDER;  Surgeon: [name], MD;  Location: [facility];  Service: Orthopedics;  Laterality: Left;   IRRIGATION AND DEBRIDEMENT SHOULDER Left 03/08/2021   Procedure: IRRIGATION AND DEBRIDEMENT SHOULDER;  Surgeon: [name], MD;  Location: [facility];  Service: Orthopedics;  Laterality: Left;   LUMBAR LAMINECTOMY/DECOMPRESSION MICRODISCECTOMY N/A 12/13/2014   Procedure: LUMBAR THREE-FOUR, LUMBAR FOUR-FIVE, LUMBAR FIVE-SACRAL ONE LAMINECTOMY;  Surgeon: [name], MD;  Location: [facility];  Service: Neurosurgery;  Laterality: N/A;  L3-4 L4-5 L5-S1 Laminectomy   MENISCUS REPAIR     Right knee   MINOR APPLICATION OF WOUND VAC Left 02/20/2021   Procedure: MINOR APPLICATION OF KERLIX PACKING/REMOVAL OF WOUND VAC;  Surgeon: [name], MD;  Location: [facility];  Service: General;  Laterality: Left;   TEE WITHOUT CARDIOVERSION N/A 02/05/2021   Procedure: TRANSESOPHAGEAL ECHOCARDIOGRAM (TEE);  Surgeon: Quintella Reichert, MD;  [facility];  Service: Cardiovascular;  Laterality: N/A;   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    Subjective Assessment - 03/20/21 1128     Subjective Patient just released from hospital after 6 weeks. See chart for h/o of sepsis in bil shoulders and left chest wall cavity. Patient had a fall in May backwards. He has been susceptible to falls since his back surgery 2 years ago. Patient has bil neuropathy in his feet. He also fell in the winter when  we had snow.    Pertinent History SEPSIS bil shoulders and chest wall, Depression, HTN, DM    Patient Stated Goals get better balance and strength    Currently in Pain? Yes    Pain Score 6     Pain Location Back    Pain Orientation Mid;Right;Left    Pain Descriptors / Indicators Aching    Pain Type Chronic pain    Pain Onset More than a month ago    Pain Frequency Intermittent    Aggravating Factors  sitting    Pain Relieving Factors getting moving                [facility] PT Assessment - 03/20/21 0001       Assessment   Medical Diagnosis unsteady gait    Referring Provider (PT) Ramiro Harvest MD.(in hospital); Jannifer Rodney FNP primary    Onset Date/Surgical Date 01/07/21    Hand Dominance Left      Precautions   Precautions Fall    Precaution Comments Still has PIC line in right arm and open wound in right thorax      Restrictions   Weight Bearing Restrictions No      Balance Screen   Has the patient fallen in the past 6 months Yes    How many times? 5    Has the patient had a decrease in activity level because of a fear of falling?  No    Is the patient reluctant to leave their home because of a fear of falling?  No      Home Tourist information centre manager residence    Living Arrangements Spouse/significant other    Home Layout One level    Additional Comments 2 stairs to get in the house no rail but can reach the porch      Prior Function   Level of Independence Independent    Vocation Retired      Press photographer Comments stands in slight lumbar flexion      ROM / Strength   AROM / PROM / Strength Strength      Strength   Overall Strength Comments MMT in sitting    Strength Assessment Site Hip;Knee    Right/Left Hip Right;Left    Right Hip Flexion 4/5    Right Hip ABduction 5/5    Right Hip ADduction 5/5    Left Hip Flexion 4-/5    Left Hip Extension 4-/5    Left Hip ABduction 5/5    Left Hip ADduction 5/5     Right/Left Knee Right;Left    Right Knee Flexion 4/5    Right Knee Extension 5/5    Left Knee Flexion 4-/5    Left Knee Extension 5/5      Flexibility   Soft Tissue Assessment /Muscle Length yes    Quadriceps bil gastroc tightness      Ambulation/Gait   Ambulation/Gait Yes    Ambulation/Gait Assistance 7: Independent    Ambulation Distance (Feet) 30 Feet    Assistive device None  Gait Pattern Wide base of support;Scissoring    Ambulation Surface Level      Standardized Balance Assessment   Standardized Balance Assessment Berg Balance Test;Five Times Sit to Stand    Five times sit to stand comments  18.93 sec      Berg Balance Test   Sit to Stand Able to stand without using hands and stabilize independently    Standing Unsupported Able to stand safely 2 minutes    Sitting with Back Unsupported but Feet Supported on Floor or Stool Able to sit safely and securely 2 minutes    Stand to Sit Sits safely with minimal use of hands    Transfers Able to transfer safely, minor use of hands    Standing Unsupported with Eyes Closed Able to stand 10 seconds with supervision    Standing Unsupported with Feet Together Able to place feet together independently and stand 1 minute safely    From Standing, Reach Forward with Outstretched Arm Can reach forward >5 cm safely (2")   comorbidities (shoulder/back)   From Standing Position, Pick up Object from Floor Able to pick up shoe safely and easily    From Standing Position, Turn to Look Behind Over each Shoulder Needs assist to keep from losing balance and falling   to the left; able to turn to right without LOB   Turn 360 Degrees Able to turn 360 degrees safely in 4 seconds or less    Standing Unsupported, Alternately Place Feet on Step/Stool Able to stand independently and safely and complete 8 steps in 20 seconds    Standing Unsupported, One Foot in Front Able to plae foot ahead of the other independently and hold 30 seconds   supervision    Standing on One Leg Tries to lift leg/unable to hold 3 seconds but remains standing independently    Total Score 45                        Objective measurements completed on examination: See above findings.               PT Education - 03/20/21 1237     Education Details HEP - sit to stand, semi tandem stance in hallway    Person(s) Educated Patient    Methods Explanation;Demonstration;Handout    Comprehension Verbalized understanding;Returned demonstration                 PT Long Term Goals - 03/20/21 1238       PT LONG TERM GOAL #1   Title Ind with advanced HEP    Time 8    Period Weeks    Status New    Target Date 05/15/21      PT LONG TERM GOAL #2   Title Improved BERG balance score to 52/56 to decrease fall risk.    Baseline baseline: 45/56    Time 8    Period Weeks    Status New      PT LONG TERM GOAL #3   Title Pt able to safely navigate indoor and outdoor distances of 300 feet with least restrictive AD including curbs and ramps.    Time 8    Period Weeks    Status New      PT LONG TERM GOAL #4   Title Improved 5x sit to stand to <= to 9 seconds to help decrease fall risk    Time 8    Period Weeks    Status  New      PT LONG TERM GOAL #5   Title Improved bil hip and knee strength to 4+/5 or better to improve function.    Time 8    Period Weeks    Status New                    Plan - 03/20/21 1203     Clinical Impression Statement Patient presents with h/o of five falls over the past six months. He reports increased falls since back surgery 2 years ago. Additionally, he is deconditioned after spending 6 weeks in the hospital due to sepsis in bil shoulders and his left chest wall. He scored 45/56 on the BERG and his 5x sit to stand is 18.93 seconds indicating he is at risk for more falls. PT recommended he use a SPC at all times based on BERG score. He ambulates with a wide BOS and intermittent scissoring of legs  to maintain balance. He has weakness in his bil hips and knees and tightness in bil gastrocs which are likely affecting gait. He also has neuropathy in bil feet. He has a h/o of back pain due to stenosis and had lumbar fusion surgery. He will benefit from PT to address these deficits.    Personal Factors and Comorbidities Comorbidity 3+    Comorbidities Sepsis, DM, HTN, depression    Stability/Clinical Decision Making Evolving/Moderate complexity    Clinical Decision Making Low    Rehab Potential Good    PT Frequency 2x / week    PT Duration 8 weeks    PT Treatment/Interventions ADLs/Self Care Home Management;Balance training;Moist Heat;Cryotherapy;Gait training;Therapeutic activities;Therapeutic exercise;Neuromuscular re-education;Manual techniques;Patient/family education    PT Next Visit Plan Balance, gait with SPC to no AD, LE strenth, gastroc flexibility    PT Home Exercise Plan sit to stands, semi tandem stance balance in hallway    Consulted and Agree with Plan of Care Patient             Patient will benefit from skilled therapeutic intervention in order to improve the following deficits and impairments:  Abnormal gait, Pain, Decreased balance, Impaired flexibility, Decreased strength  Visit Diagnosis: Unsteadiness on feet - Plan: PT plan of care cert/re-cert  Muscle weakness (generalized) - Plan: PT plan of care cert/re-cert     Problem List Patient Active Problem List   Diagnosis Date Noted   Abscess    History of COVID-19    Psoas abscess (HCC)    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   Right shoulder pain 02/14/2021   Chest wall abscess 02/14/2021   Obesity 02/14/2021   Normocytic anemia 02/14/2021   Thrombocytosis 02/14/2021   Endocarditis 02/05/2021   Vision changes 02/04/2021   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   Pressure injury of skin 02/01/2021   HTN (hypertension) 02/01/2021   Anxiety 02/01/2021   Left shoulder pain 02/01/2021    Hyponatremia 01/31/2021   Positive colorectal cancer screening using Cologuard test 12/28/2020   DOE (dyspnea on exertion) 10/20/2020   Diabetic polyneuropathy associated with type 2 diabetes mellitus (HCC) 10/09/2020   Diabetes mellitus (HCC) 10/30/2018   Lumbar stenosis with neurogenic claudication 10/14/2017   DDD (degenerative disc disease), lumbar 02/17/2017   Left leg weakness 02/05/2017   Hyperlipidemia associated with type 2 diabetes mellitus (HCC) 10/24/2016   Depression, recurrent (HCC) 10/24/2016   Right knee pain 05/21/2016   DDD (degenerative disc disease), lumbosacral 05/21/2016   Overweight (BMI 25.0-29.9) 05/21/2016   Lumbar stenosis 12/13/2014  Solon Palm PT 03/20/2021, 12:54 PM  [facility] [address] [place], [place], [address] Phone: [phone]   Fax:  [phone]  Name: John Ware MRN: [id number] Date of Birth: 06-03-1966

## 2021-03-20 NOTE — Progress Notes (Signed)
Subjective:    Patient ID: John Ware, male    DOB: 1966-04-07, 55 y.o.   MRN: [id number]  Chief Complaint  Patient presents with   Hospitalization Follow-up   Today's visit was for Transitional Care Management.  The patient was discharged from [facility] on 03/15/21  with a primary diagnosis of sepsis and DKA.   Contact with the patient and/or caregiver, by a clinical staff member, was made on 03/16/21 and was documented as a telephone encounter within the EMR.  Through chart review and discussion with the patient I have determined that management of their condition is of high complexity.   PT went to the ED on 01/31/21 for weakness. Found to be hyponatremia and elevated glucose.   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 -Large left posterior wall and bilateral psoas muscle abscess, bilateral septic shoulder joint, osteomyelitis and discitis. -Patient noted to have floaters was seen by ophthalmology and endophthalmitis ruled out. -Patient underwent left shoulder debridement 6/16, right shoulder debridement 6/17, drain placement by IR 6/24, status post bilateral shoulder debridement with wound VAC placement 6/29. -Currently on Dilaudid PCA for pain control as well as oxycodone 10 mg every 4 hours as needed breakthrough pain. -Repeat CT shoulder performed. -Status post I&D 7/6 with cultures growing Pseudomonas. -Antibiotics changed to cefepime on 03/07/2021. -Patient underwent another irrigation and debridement of the left shoulder on 03/06/2021. -Patient noted to have had wound VAC on left shoulder and Hemovac on bilateral shoulders. -Repeat MRI left shoulder 03/07/2021 with continued septic arthritis and acute osteomyelitis, full-thickness retracted  tears of supraspinatus and infraspinatus tendons.  Intramuscular edema was controlled within the supraspinatus, infraspinatus and anterior deltoid muscle, likely myositis. -Patient underwent another left shoulder irrigation debridement with wound VAC placement on 03/08/2021, both Hemovacs and bilateral shoulders were removed. -Patient underwent left shoulder excisional debridement, glenohumeral joint, deep abscess, subacromial space per Dr Mardelle Matte 03/12/2021. -Patient seen in consultation by ID who recommended cefepime for total of 4 more weeks with starting date 03/08/2021. -Patient underwent PICC line placement and removal of left IJ on 03/13/2021. -We will need outpatient follow-up with ID after IV antibiotics are done to convert to oral antibiotic suppression due to significant burden of disease. -Patient initially was on Dilaudid PCA pump and subsequently weaned back to home regimen of long-acting OxyContin as well as oxycodone as needed breakthrough pain.   2.  Hypertension -Patient's amlodipine and lisinopril held during the hospitalization.   -Blood pressure remained stable.   -Amlodipine will be resumed on discharge.    3.  Poor dentition with dental lucencies per CT maxillofacial -Oral surgery, Dr. Conley Simmonds did not think patient had any acute issues ongoing and low suspicion for potential source for ID. -We will need outpatient follow-up with dentistry.  4.  Anemia of chronic disease Status posttransfusion 3 units total packed red blood cells during this hospitalization with hemoglobin stabilized at 8.9 by day of discharge.   -Outpatient follow-up.  5.  DKA -Resolved.  6.  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 -Patient's oral hypoglycemic agents were held during the hospitalization and patient maintained on Lantus 25 units daily and SSI.   -Outpatient follow-up.   7.  Ascending aortic dilatation -2D  echo 6/10 with mild dilatation of ascending aorta, 38  mm -TEE with normal thoracic and ascending aorta noted on 02/05/2021.  8.  Anxiety -Patient maintained on Wellbutrin. -Zoloft discontinued during hospitalization secondary to hyponatremia. -Outpatient follow-up.  9.  COVID-19 infection -Resolved.  10.  Acute on chronic hyponatremia -Stable.  At baseline.  Patient's Zoloft was discontinued during the hospitalization.  11.  Pressure injury, POA Pressure Injury 01/31/21 Buttocks Right;Left;Posterior Stage 2 -  Partial thickness loss of dermis presenting as a shallow open injury with a red, pink wound bed without slough. (Active)  01/31/21 1546  Location: Buttocks  Location Orientation: Right;Left;Posterior  Staging: Stage 2 -  Partial thickness loss of dermis presenting as a shallow open injury with a red, pink wound bed without slough.  Wound Description (Comments):  Present on Admission: Yes    He reports he is feeling much better, but is still very weak. He reports his pain is 6-7 out 10 in his back. His glucose has been 86-110.   He has PT today. He is followed by ID and his appointment is 04/09/21. He is followed by Ortho and will make a follow up. He has a follow up with [facility] for wound recheck of left lateral chest wall wounds.  His lisinopril was stopped because he was hypotension. His Zoloft was stopped related to hyponatremia.    Diabetes He presents for his follow-up diabetic visit. He has type 2 diabetes mellitus. Hypoglycemia symptoms include nervousness/anxiousness. Pertinent negatives for diabetes include no blurred vision. Diabetic complications include heart disease. Risk factors for coronary artery disease include dyslipidemia, hypertension, male sex and sedentary lifestyle. He is following a generally unhealthy diet. His overall blood glucose range is 90-110 mg/dl. An ACE inhibitor/angiotensin II receptor blocker is not being taken.  Anxiety Presents for  follow-up visit. Symptoms include excessive worry, irritability and nervous/anxious behavior. Symptoms occur occasionally. The severity of symptoms is moderate.       Review of Systems  Constitutional:  Positive for irritability.  Eyes:  Negative for blurred vision.  Psychiatric/Behavioral:  The patient is nervous/anxious.   All other systems reviewed and are negative.     Objective:   Physical Exam Vitals reviewed.  Constitutional:      General: He is not in acute distress.    Appearance: He is well-developed.  HENT:     Head: Normocephalic.     Right Ear: Tympanic membrane normal.     Left Ear: Tympanic membrane normal.  Eyes:     General:        Right eye: No discharge.        Left eye: No discharge.     Pupils: Pupils are equal, round, and reactive to light.  Neck:     Thyroid: No thyromegaly.  Cardiovascular:     Rate and Rhythm: Normal rate and regular rhythm.     Heart sounds: Normal heart sounds. No murmur heard. Pulmonary:     Effort: Pulmonary effort is normal. No respiratory distress.     Breath sounds: Normal breath sounds. No wheezing.  Abdominal:     General: Bowel sounds are normal. There is no distension.     Palpations: Abdomen is soft.     Tenderness: There is no abdominal tenderness.  Musculoskeletal:        General: No tenderness. Normal range of motion.     Cervical back: Normal range of motion and neck supple.     Right lower leg: Edema (3+) present.     Left lower leg: Edema (3+) present.  Skin:    General: Skin is warm and dry.     Coloration: Skin is pale.     Findings: No erythema or rash.          Comments: Bilateral open wounds, dressing intact. No redness or warmth noted.   Neurological:     Mental Status: He is alert and oriented to person, place, and time.     Cranial Nerves: No cranial nerve deficit.     Motor: Weakness present.     Deep Tendon Reflexes: Reflexes are normal and symmetric.  Psychiatric:        Behavior: Behavior  normal.        Thought Content: Thought content normal.        Judgment: Judgment normal.      BP 117/77   Pulse 94   Temp 98.4 F (36.9 C) (Temporal)   Ht '5\' 10"'  (1.778 m)   Wt 189 lb 6.4 oz (85.9 kg)   SpO2 100%   BMI 27.18 kg/m      Assessment & Plan:  Lorance L Milanes comes in today with chief complaint of Hospitalization Follow-up   Diagnosis and orders addressed:  1. Hospital discharge follow-up - CMP14+EGFR - CBC with Differential/Platelet  2. Primary hypertension - CMP14+EGFR - CBC with Differential/Platelet  3. Type 2 diabetes mellitus with other specified complication, without long-term current use of insulin (HCC) - CMP14+EGFR - CBC with Differential/Platelet - Continuous Blood Gluc Sensor (FREESTYLE LIBRE 2 SENSOR) MISC; Needs to check BS QID  Dispense: 1 each; Refill: 11 - Bayer DCA Hb A1c Waived  4. Psoas abscess ([place]) - CMP14+EGFR - CBC with Differential/Platelet  5. Psoas muscle abscess (HCC) - CMP14+EGFR - CBC with Differential/Platelet  6. Depression, recurrent ([place]) - CMP14+EGFR - CBC with Differential/Platelet  7. Chest wall abscess - CMP14+EGFR - CBC with Differential/Platelet   Labs pending Health Maintenance reviewed Diet and exercise encouraged  Follow up plan: 1 month    Evelina Dun, FNP

## 2021-03-21 ENCOUNTER — Other Ambulatory Visit: Payer: Self-pay | Admitting: Family Medicine

## 2021-03-21 DIAGNOSIS — B370 Candidal stomatitis: Secondary | ICD-10-CM

## 2021-03-21 MED ORDER — MAGIC MOUTHWASH: 10.0000 mL | Freq: Four times a day (QID) | ORAL | Status: DC | PRN

## 2021-03-22 ENCOUNTER — Ambulatory Visit: Payer: Commercial Managed Care - PPO | Admitting: Physical Therapy

## 2021-03-23 ENCOUNTER — Encounter: Payer: Self-pay | Admitting: Physical Therapy

## 2021-03-23 ENCOUNTER — Telehealth: Payer: Self-pay | Admitting: Family

## 2021-03-23 ENCOUNTER — Other Ambulatory Visit: Payer: Self-pay

## 2021-03-23 ENCOUNTER — Ambulatory Visit: Payer: Commercial Managed Care - PPO | Admitting: Physical Therapy

## 2021-03-23 DIAGNOSIS — M6281 Muscle weakness (generalized): Secondary | ICD-10-CM

## 2021-03-23 DIAGNOSIS — R2681 Unsteadiness on feet: Secondary | ICD-10-CM

## 2021-03-23 DIAGNOSIS — B370 Candidal stomatitis: Secondary | ICD-10-CM

## 2021-03-23 MED ORDER — MAGIC MOUTHWASH: 5.0000 mL | Freq: Four times a day (QID) | ORAL | 0 refills | Status: DC

## 2021-03-23 NOTE — Telephone Encounter (Signed)
Rx still did not go even via fax through Epic, called in verbally to [facility] pharmacy Pt aware

## 2021-03-23 NOTE — Therapy (Signed)
[facility] [address] [place], [place], [address] Phone: [phone]   Fax:  [phone]  Physical Therapy Treatment  Patient Details  Name: John Ware MRN: [id number] Date of Birth: 1965-09-25 Referring Provider (PT): Ramiro Harvest MD.(in hospital); Jannifer Rodney FNP primary   Encounter Date: 03/23/2021   PT End of Session - 03/23/21 1128     Visit Number 2    Number of Visits 16    Date for PT Re-Evaluation 05/15/21    Authorization Type U[employer]    PT Start Time 1123    PT Stop Time 1200    PT Time Calculation (min) 37 min    Activity Tolerance Patient tolerated treatment well    Behavior During Therapy WFL for tasks assessed/performed             Past Medical History:  Diagnosis Date   Depression    Diabetes mellitus without complication (HCC)    Hypertension    Spinal stenosis    With Neurogenic Claudication    Past Surgical History:  Procedure Laterality Date   CARPAL TUNNEL RELEASE Bilateral    INCISION AND DRAINAGE ABSCESS Left 03/12/2021   Procedure: INCISION AND DRAINAGE ABSCESS;  Surgeon: [name], MD;  Location: [facility];  Service: Orthopedics;  Laterality: Left;   IR US GUIDE BX ASP/DRAIN  02/02/2021   IRRIGATION AND DEBRIDEMENT ABSCESS Left 02/08/2021   Procedure: MINOR INCISION AND DRAINAGE OF ABSCESS;  Surgeon: [name], MD;  Location: [facility];  Service: Orthopedics;  Laterality: Left;   IRRIGATION AND DEBRIDEMENT ABSCESS Left 02/10/2021   Procedure: IRRIGATION AND DEBRIDEMENT ABSCESS LEFT LATERAL CHEST WALL;  Surgeon: [name], MD;  Location: [facility];  Service: General;  Laterality: Left;   IRRIGATION AND DEBRIDEMENT ABSCESS Bilateral 02/20/2021   Procedure: MINOR INCISION AND DRAINAGE OF ABSCESS/WITH BILATERAL WOUND VAC PLACEMENT;  Surgeon: [name], MD;  Location: [facility];  Service: Orthopedics;  Laterality: Bilateral;   IRRIGATION AND DEBRIDEMENT SHOULDER Bilateral 02/28/2021    Procedure: IRRIGATION AND DEBRIDEMENT SHOULDER;  Surgeon: [name], MD;  Location: [facility];  Service: Orthopedics;  Laterality: Bilateral;   IRRIGATION AND DEBRIDEMENT SHOULDER Left 03/06/2021   Procedure: IRRIGATION AND DEBRIDEMENT SHOULDER;  Surgeon: [name], MD;  Location: [facility];  Service: Orthopedics;  Laterality: Left;   IRRIGATION AND DEBRIDEMENT SHOULDER Left 03/08/2021   Procedure: IRRIGATION AND DEBRIDEMENT SHOULDER;  Surgeon: [name], MD;  Location: [facility];  Service: Orthopedics;  Laterality: Left;   LUMBAR LAMINECTOMY/DECOMPRESSION MICRODISCECTOMY N/A 12/13/2014   Procedure: LUMBAR THREE-FOUR, LUMBAR FOUR-FIVE, LUMBAR FIVE-SACRAL ONE LAMINECTOMY;  Surgeon: [name], MD;  Location: [facility];  Service: Neurosurgery;  Laterality: N/A;  L3-4 L4-5 L5-S1 Laminectomy   MENISCUS REPAIR     Right knee   MINOR APPLICATION OF WOUND VAC Left 02/20/2021   Procedure: MINOR APPLICATION OF KERLIX PACKING/REMOVAL OF WOUND VAC;  Surgeon: [name], MD;  Location: [facility];  Service: General;  Laterality: Left;   TEE WITHOUT CARDIOVERSION N/A 02/05/2021   Procedure: TRANSESOPHAGEAL ECHOCARDIOGRAM (TEE);  Surgeon: Quintella Reichert, MD;  Location: [facility];  Service: Cardiovascular;  Laterality: N/A;   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   Subjective Assessment - 03/23/21 1125     Subjective Patient reports that he is doing well at home and has three weeks left of antibiotic via PICC line.    Pertinent History SEPSIS bil shoulders and chest wall, Depression, HTN, DM    Patient Stated Goals get better balance and strength    Currently in Pain? Yes    Pain Score 2     Pain Location Back     Pain Orientation Lower    Pain Descriptors / Indicators Discomfort    Pain Type Chronic pain    Pain Onset More than a month ago    Pain Frequency Constant                [facility] PT Assessment - 03/23/21 0001       Assessment   Medical Diagnosis unsteady gait    Referring Provider (PT) Ramiro Harvest MD.(in hospital); Jannifer Rodney FNP primary    Onset Date/Surgical Date 01/07/21    Hand Dominance Left      Precautions   Precautions Fall    Precaution Comments Still has PIC line in right arm and open wound in right thorax      Restrictions   Weight Bearing Restrictions No                           [facility] Adult PT Treatment/Exercise - 03/23/21 0001       Exercises   Exercises Knee/Hip      Knee/Hip Exercises: Aerobic   Nustep L2 x13 min      Knee/Hip Exercises: Standing   Heel Raises Both;20 reps    Heel Raises Limitations B toe raise x20 reps    Hip Flexion AROM;Both;20 reps;Knee bent    Hip Abduction AROM;Both;2 sets;15 reps;Knee straight                 Balance Exercises - 03/23/21 0001       Balance Exercises: Standing   Standing Eyes Opened Narrow base of support (BOS);Foam/compliant surface;Time    Standing Eyes Opened Time    Tandem Stance Eyes open;Intermittent upper extremity support;Time    Tandem Stance Limitations x3 min    Balance Beam B forward steps to airex, sidestep B to airex                    PT Long Term Goals - 03/23/21 1209       PT LONG TERM GOAL #1   Title Ind with advanced HEP    Time 8    Period Weeks    Status On-going      PT LONG TERM GOAL #2   Title Improved BERG balance score to 52/56 to decrease fall risk.    Baseline baseline: 45/56    Time 8    Period Weeks    Status On-going      PT LONG TERM GOAL #3   Title Pt able to safely navigate indoor and outdoor distances of 300 feet with least restrictive AD including curbs and ramps.    Time 8    Period Weeks    Status  On-going      PT LONG TERM GOAL #4   Title Improved 5x sit to stand to <= to 9 seconds to help decrease fall risk    Time 8    Period Weeks    Status On-going  PT LONG TERM GOAL #5   Title Improved bil hip and knee strength to 4+/5 or better to improve function.    Time 8    Period Weeks    Status On-going                   Plan - 03/23/21 1210     Clinical Impression Statement Patient presented in clinic with chronic LBP but feeling well overall. Patient guided through light LE AROM strengthening for functional activities. Limited UE activities due to incision of shoulders and PICC line following septic episode. Patient felt somewhat fatigued with strengthening. Patient observed with appropriate ankle strategy even with steps to airex.    Personal Factors and Comorbidities Comorbidity 3+    Comorbidities Sepsis, DM, HTN, depression    Stability/Clinical Decision Making Evolving/Moderate complexity    Rehab Potential Good    PT Frequency 2x / week    PT Duration 8 weeks    PT Treatment/Interventions ADLs/Self Care Home Management;Balance training;Moist Heat;Cryotherapy;Gait training;Therapeutic activities;Therapeutic exercise;Neuromuscular re-education;Manual techniques;Patient/family education    PT Next Visit Plan Balance, gait with SPC to no AD, LE strenth, gastroc flexibility    PT Home Exercise Plan sit to stands, semi tandem stance balance in hallway    Consulted and Agree with Plan of Care Patient             Patient will benefit from skilled therapeutic intervention in order to improve the following deficits and impairments:  Abnormal gait, Pain, Decreased balance, Impaired flexibility, Decreased strength  Visit Diagnosis: Unsteadiness on feet  Muscle weakness (generalized)     Problem List Patient Active Problem List   Diagnosis Date Noted   Abscess    History of COVID-19    Psoas abscess (HCC)    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   Right  shoulder pain 02/14/2021   Chest wall abscess 02/14/2021   Obesity 02/14/2021   Normocytic anemia 02/14/2021   Thrombocytosis 02/14/2021   Endocarditis 02/05/2021   Vision changes 02/04/2021   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   Pressure injury of skin 02/01/2021   HTN (hypertension) 02/01/2021   Anxiety 02/01/2021   Left shoulder pain 02/01/2021   Hyponatremia 01/31/2021   Positive colorectal cancer screening using Cologuard test 12/28/2020   DOE (dyspnea on exertion) 10/20/2020   Diabetic polyneuropathy associated with type 2 diabetes mellitus (HCC) 10/09/2020   Diabetes mellitus (HCC) 10/30/2018   Lumbar stenosis with neurogenic claudication 10/14/2017   DDD (degenerative disc disease), lumbar 02/17/2017   Left leg weakness 02/05/2017   Hyperlipidemia associated with type 2 diabetes mellitus (HCC) 10/24/2016   Depression, recurrent (HCC) 10/24/2016   Right knee pain 05/21/2016   DDD (degenerative disc disease), lumbosacral 05/21/2016   Overweight (BMI 25.0-29.9) 05/21/2016   Lumbar stenosis 12/13/2014    Marvell Fuller, PTA 03/23/2021, 12:16 PM  [facility] [address] [place], [place], [address] Phone: [phone]   Fax:  [phone]  Name: John Ware MRN: [id number] Date of Birth: 01-26-1966

## 2021-03-27 ENCOUNTER — Ambulatory Visit: Payer: Commercial Managed Care - PPO | Admitting: *Deleted

## 2021-03-27 ENCOUNTER — Telehealth: Payer: Self-pay | Admitting: *Deleted

## 2021-03-27 NOTE — Telephone Encounter (Signed)
s/w patient he is our of this medication and is not feeling well today. He cx today @ 1:45 and will call back to r/s at a later date

## 2021-04-02 ENCOUNTER — Telehealth: Payer: Self-pay | Admitting: *Deleted

## 2021-04-02 NOTE — Telephone Encounter (Signed)
Pt is on high dose controlled medications. These will have to be filled at Pain Clinic, I am sorry.

## 2021-04-02 NOTE — Telephone Encounter (Signed)
Pt called in for follow-up appt from hospital couple weeks ago, told receptionist it was for pain medication and he couldn't get into the pain clinic until the 16th. He did not initially tell me this. There was not a regular open appt with christy until the 16th either, he asked if this was the first thing she had and when I said yes he wanted to speak to Chrsity's nurse to see if pain medicine could be called in, I explained to him that pain medicine was a controlled substance and had to be prescribed during a visit, it was not happy that this was the soonest and ended the call

## 2021-04-03 NOTE — Telephone Encounter (Signed)
Patient was asking if you could just give him something till he gets in with pain clinic he does not care what it is just so he is not sitting in pain till his appt. Please advise.

## 2021-04-03 NOTE — Telephone Encounter (Signed)
Left message to call back  

## 2021-04-05 ENCOUNTER — Encounter: Payer: Self-pay | Admitting: Internal Medicine

## 2021-04-06 ENCOUNTER — Telehealth: Payer: Self-pay | Admitting: Pharmacist

## 2021-04-06 NOTE — Telephone Encounter (Signed)
John Ware from [facility] called to ask for pull PICC orders. Per Dr. Feliz Beam note from 7/15, ok to pull PICC after 4 weeks of treatment, which was yesterday. Dr. Drue Second also notes that patient will need transition to oral antibiotics for suppression due to burden of disease. Patient has an appointment scheduled with Dr. Elinor Parkinson on Monday 8/15. John Ware verbalized understanding,

## 2021-04-09 ENCOUNTER — Ambulatory Visit (INDEPENDENT_AMBULATORY_CARE_PROVIDER_SITE_OTHER): Payer: Commercial Managed Care - PPO | Admitting: Infectious Diseases

## 2021-04-09 ENCOUNTER — Other Ambulatory Visit: Payer: Self-pay

## 2021-04-09 VITALS — BP 129/84 | HR 65 | Temp 97.8°F | Wt 193.0 lb

## 2021-04-09 DIAGNOSIS — R7881 Bacteremia: Secondary | ICD-10-CM | POA: Diagnosis not present

## 2021-04-09 DIAGNOSIS — B9561 Methicillin susceptible Staphylococcus aureus infection as the cause of diseases classified elsewhere: Secondary | ICD-10-CM

## 2021-04-09 DIAGNOSIS — I330 Acute and subacute infective endocarditis: Secondary | ICD-10-CM

## 2021-04-09 DIAGNOSIS — M00019 Staphylococcal arthritis, unspecified shoulder: Secondary | ICD-10-CM

## 2021-04-09 DIAGNOSIS — M4645 Discitis, unspecified, thoracolumbar region: Secondary | ICD-10-CM

## 2021-04-09 DIAGNOSIS — Z5181 Encounter for therapeutic drug level monitoring: Secondary | ICD-10-CM | POA: Diagnosis not present

## 2021-04-09 MED ORDER — DOXYCYCLINE HYCLATE 100 MG PO TABS: 100.0000 mg | ORAL_TABLET | Freq: Two times a day (BID) | ORAL | 2 refills | Status: DC

## 2021-04-09 MED ORDER — CEPHALEXIN 500 MG PO CAPS: 500.0000 mg | ORAL_CAPSULE | Freq: Three times a day (TID) | ORAL | 1 refills | Status: DC

## 2021-04-09 NOTE — Progress Notes (Signed)
[facility]                                                             [place], [place], [place], [address]                                                                  Phn. [phone]; Fax: [phone]                                                                             Date: 04/09/21  Reason for Referral: HFU for disseminated MSSA infection   Assessment 55 year old male with PMH of chronic pain, hypertension, DM 2 and recent prolonged hospitalization complicated by disseminated MSSA infection ( TV endocarditis with septic pulmonary emboli, TL discitis and Osteomyelitis/psoas abscesses, bilateral shoulder septic arthritisand Osteomyelitis ) and left shoulder MSSA and PsA septic arthritis here for hospital follow-up.   Problem List Items Addressed This Visit       Cardiovascular and Mediastinum   Endocarditis   Other Visit Diagnoses     Medication monitoring encounter    -  Primary   Relevant Orders   CBC (Completed)   Comprehensive metabolic panel (Completed)   Sedimentation rate (Completed)   C-reactive protein (Completed)   MSSA bacteremia       Discitis of thoracolumbar region       Staphylococcal arthritis of shoulder, unspecified laterality (HCC)       Relevant Medications   cephALEXin (KEFLEX) 500 MG capsule      Labs 03/26/21- wbc 11.4, hb 10.8, plts 511, cr 0.55, ESR 75, CRP 51 Plan IV cefepime was dc'ed on 8/12 as  previously planned. PICC line has been removed Will start on cephalexin 563m PO TID for suppression for MSSA only Labs today  Fu in 1 month   All questions and concerns were discussed and addressed. Patient verbalized understanding of the plan. ____________________________________________________________________________________________________________________ HPI/Interval events 55year old male with PMH of chronic pain, hypertension, DM  2 and recent prolonged hospitalization for disseminated MSSA infection  here for hospital follow-up.  Patient was recently hospitalized 5/20 8-7/21/2022 where hospital course was complicated with disseminated MSSA infection with TV endocarditis noted on TEE 02/22/2021, thoracic and lumbar osteomyelitis and bilateral psoas muscle abscess with MSSA bacteremia, bilateral shoulder joint septic arthritis and osteomyelitis. Patient underwent left shoulder debridement 6/16, right shoulder debridement 6/18, Left supraclavicular drain placement and RT paraspinal abscess drain placement by IR 6/24, status post bilateral shoulder debridement with wound VAC placement 6/28.  Status post I&D of bilateral shoulder joints  7/6 with cultures growing Pseudomonas. Antibiotics changed to cefepime on 03/07/2021. Patient underwent another irrigation and debridement of the left shoulder on 03/06/2021. Repeat MRI left shoulder 03/07/2021 with continued septic arthritis and acute  osteomyelitis, full-thickness retracted tears of supraspinatus. Seen by ID and recommended 4 more weeks of cefepime starting 7/14 as day 1   04/09/21 Here for follow up with his wife. He has completed IV cefepime 8/12 and PICC line has been removed. Denies fevers, chills and sweats. Denies nausea, vomiting, abdominal pain and diarrhea. Has some pain in the bilateral shoulder joints but stable. He can move his shoulder joints ut there is some restriction in movement due to muscle tear. He is following up with Dr Mardelle Matte as well. He has been working with PT. No new complaints today.   ROS-  Constitutional: Negative for fever, chills, activity change, appetite change, fatigue and unexpected weight change.  Respiratory: Negative for cough, shortness of breath Cardiovascular: Negative for chest pain, palpitations and leg swelling.  Gastrointestinal: Negative for nausea, vomiting, abdominal pain, diarrhea/constipation, .  Genitourinary: Negative for dysuria,  hematuria, flank pain Musculoskeletal: Negative for myalgias, arthralgia, back pain, joint swelling Skin: Negative for rashes, lesions  Neurological: Negative for weakness, dizziness or headache  Past Medical History:  Diagnosis Date   Depression    Diabetes mellitus without complication ([place])    Hypertension    Spinal stenosis    With Neurogenic Claudication   Past Surgical History:  Procedure Laterality Date   CARPAL TUNNEL RELEASE Bilateral    INCISION AND DRAINAGE ABSCESS Left 03/12/2021   Procedure: INCISION AND DRAINAGE ABSCESS;  Surgeon: Marchia Bond, MD;  Location: [facility];  Service: Orthopedics;  Laterality: Left;   IR US GUIDE BX ASP/DRAIN  02/02/2021   IRRIGATION AND DEBRIDEMENT ABSCESS Left 02/08/2021   Procedure: MINOR INCISION AND DRAINAGE OF ABSCESS;  Surgeon: Marchia Bond, MD;  Location: [facility];  Service: Orthopedics;  Laterality: Left;   IRRIGATION AND DEBRIDEMENT ABSCESS Left 02/10/2021   Procedure: IRRIGATION AND DEBRIDEMENT ABSCESS LEFT LATERAL CHEST WALL;  Surgeon: Greer Pickerel, MD;  Location: [facility];  Service: General;  Laterality: Left;   IRRIGATION AND DEBRIDEMENT ABSCESS Bilateral 02/20/2021   Procedure: MINOR INCISION AND DRAINAGE OF ABSCESS/WITH BILATERAL WOUND VAC PLACEMENT;  Surgeon: Marchia Bond, MD;  [facility];  Service: Orthopedics;  Laterality: Bilateral;   IRRIGATION AND DEBRIDEMENT SHOULDER Bilateral 02/28/2021   Procedure: IRRIGATION AND DEBRIDEMENT SHOULDER;  Surgeon: Hiram Gash, MD;  [facility];  Service: Orthopedics;  Laterality: Bilateral;   IRRIGATION AND DEBRIDEMENT SHOULDER Left 03/06/2021   Procedure: IRRIGATION AND DEBRIDEMENT SHOULDER;  Surgeon: Renette Butters, MD;  [facility];  Service: Orthopedics;  Laterality: Left;   IRRIGATION AND DEBRIDEMENT SHOULDER Left 03/08/2021   Procedure: IRRIGATION AND DEBRIDEMENT SHOULDER;  Surgeon: Marchia Bond, MD;  [facility];  Service: Orthopedics;  Laterality: Left;   LUMBAR  LAMINECTOMY/DECOMPRESSION MICRODISCECTOMY N/A 12/13/2014   Procedure: LUMBAR THREE-FOUR, LUMBAR FOUR-FIVE, LUMBAR FIVE-SACRAL ONE LAMINECTOMY;  Surgeon: Kristeen Miss, MD;  Location: [facility];  Service: Neurosurgery;  Laterality: N/A;  L3-4 L4-5 L5-S1 Laminectomy   MENISCUS REPAIR     Right knee   MINOR APPLICATION OF WOUND VAC Left 02/20/2021   Procedure: MINOR APPLICATION OF KERLIX PACKING/REMOVAL OF WOUND VAC;  Surgeon: Johnathan Hausen, MD;  [facility];  Service: General;  Laterality: Left;   TEE WITHOUT CARDIOVERSION N/A 02/05/2021   Procedure: TRANSESOPHAGEAL ECHOCARDIOGRAM (TEE);  Surgeon: Sueanne Margarita, MD;  Location: [facility];  Service: Cardiovascular;  Laterality: N/A;   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 30 tablet 0   amLODipine (NORVASC) 5 MG tablet Take 1 tablet (5 mg total) by mouth daily. 90 tablet 3   ascorbic acid (VITAMIN C) 500 MG tablet Take 1 tablet (500 mg total) by mouth 2 (two) times daily.     buPROPion (WELLBUTRIN XL) 150 MG 24 hr tablet Take 1 tablet (150 mg total) by mouth 2 (two) times daily.     Continuous Blood Gluc Sensor (FREESTYLE LIBRE 2 SENSOR) MISC Needs to check BS QID 1 each 11   ferrous sulfate 325 (65 FE) MG tablet Take 1 tablet (325 mg total) by mouth 2 (two) times daily with a meal.  3   gabapentin (NEURONTIN) 800 MG tablet Take 1 tablet (800 mg total) by mouth 3 (three) times daily.     magic mouthwash SOLN Take 5 mLs by mouth 4 (four) times daily. 240 mL 0   methocarbamol (ROBAXIN) 500 MG tablet Take 1 tablet (500  mg total) by mouth 2 (two) times daily as needed for muscle spasms. 60 tablet 2   oxyCODONE ER (XTAMPZA ER) 18 MG C12A Take 1 tablet by mouth every 12 (twelve) hours.     oxyCODONE-acetaminophen (PERCOCET) 10-325 MG tablet Take 1 tablet by mouth 3 (three) times daily as needed for pain.     polyethylene glycol (MIRALAX / GLYCOLAX) 17 g packet Take 17 g by mouth daily. 28 each 1   senna-docusate (SENOKOT-S) 8.6-50 MG tablet Take 1 tablet by mouth 2 (two) times daily. 60 tablet 1   Vitamin D, Ergocalciferol, (DRISDOL) 1.25 MG (50000 UNIT) CAPS capsule Take 50,000 Units by mouth once a week.     zinc sulfate 220 (50 Zn) MG capsule Take 1 capsule (220 mg total) by mouth daily.     ceFEPime (MAXIPIME) IVPB Inject 2 g into the vein every 8 (eight) hours for 27 days. Indication:  MSSA/pseudomonas shoulder infection First Dose: Yes Last Day of Therapy:  04/05/21 Labs - Once weekly:  CBC/D and BMP, Labs - Every other week:  ESR and CRP Method of administration: IV Push Method of administration may be changed at the discretion of home infusion pharmacist based upon assessment of the patient and/or caregiver's ability to self-administer the medication ordered. (Patient not taking: Reported on 04/09/2021) 81 Units 0   metFORMIN (GLUCOPHAGE XR) 750 MG 24 hr tablet Take 2 tablets (1,500 mg total) by mouth daily with breakfast. 180 tablet 2   [DISCONTINUED] atorvastatin (LIPITOR) 20 MG tablet Take 1 tablet (20 mg total) by mouth daily. (Patient not taking: Reported on 06/23/2020) 90 tablet 3   Current Facility-Administered Medications on File Prior to Visit  Medication Dose Route Frequency Provider Last Rate Last Admin   magic mouthwash  10 mL Oral QID PRN Sharion Balloon, FNP       Allergies  Allergen Reactions   Tizanidine     Leg cramps   Social History   Socioeconomic History   Marital status: Married    Spouse name: Not on file   Number of children: Not on file   Years of education: Not on file    Highest education level: Not on  file  Occupational History   Not on file  Tobacco Use   Smoking status: Former    Packs/day: 2.00    Years: 15.00    Pack years: 30.00    Types: Cigarettes    Quit date: 2012    Years since quitting: 10.6   Smokeless tobacco: Never  Vaping Use   Vaping Use: Never used  Substance and Sexual Activity   Alcohol use: Yes    Comment: occasional   Drug use: No   Sexual activity: Not on file  Other Topics Concern   Not on file  Social History Narrative   Not on file   Social Determinants of Health   Financial Resource Strain: Not on file  Food Insecurity: Not on file  Transportation Needs: Not on file  Physical Activity: Not on file  Stress: Not on file  Social Connections: Not on file  Intimate Partner Violence: Not on file   Family History  Problem Relation Age of Onset   Colon cancer Neg Hx     Vitals Wt 193 lb (87.5 kg)   BMI 27.69 kg/m   Examination  General - not in acute distress, comfortably sitting in chair HEENT - PEERLA, no pallor and no icterus Chest - b/l clear air entry, no additional sounds CVS- Normal s1s2, RRR Abdomen - Soft, Non tender , non distended Ext- no pedal edema, Bilateral shoulder joint with no swelling/warmth/tenderness and restricted ROM Neuro: grossly normal Back - WNL Psych : calm and cooperative Skin- Left lateral chest wound with no signs of infection ( no redness/drainage, periwound erythema)     Recent labs CBC Latest Ref Rng & Units 03/20/2021 03/15/2021 03/14/2021  WBC 3.4 - 10.8 x10E3/uL 11.8(H) 9.7 13.1(H)  Hemoglobin 13.0 - 17.7 g/dL 9.7(L) 8.9(L) 7.9(L)  Hematocrit 37.5 - 51.0 % 29.0(L) 28.2(L) 25.0(L)  Platelets 150 - 450 x10E3/uL 499(H) 556(H) 512(H)   CMP Latest Ref Rng & Units 03/20/2021 03/15/2021 03/12/2021  Glucose 65 - 99 mg/dL 153(H) 93 135(H)  BUN 6 - 24 mg/dL 14 17 21(H)  Creatinine 0.76 - 1.27 mg/dL 0.46(L) 0.47(L) 0.41(L)  Sodium 134 - 144 mmol/L 139 135 132(L)   Potassium 3.5 - 5.2 mmol/L 5.3(H) 4.0 4.0  Chloride 96 - 106 mmol/L 99 99 96(L)  CO2 20 - 29 mmol/L '25 27 27  ' Calcium 8.7 - 10.2 mg/dL 9.9 9.1 8.6(L)  Total Protein 6.0 - 8.5 g/dL 7.8 - -  Total Bilirubin 0.0 - 1.2 mg/dL <0.2 - -  Alkaline Phos 44 - 121 IU/L 135(H) - -  AST 0 - 40 IU/L 14 - -  ALT 0 - 44 IU/L 12 - -   Pertinent Microbiology Results for orders placed or performed during the hospital encounter of 01/31/21  Urine culture     Status: Abnormal   Collection Time: 01/31/21  8:51 AM   Specimen: In/Out Cath Urine  Result Value Ref Range Status   Specimen Description   Final    IN/OUT CATH URINE Performed at [facility], [place] [address] [place], [place] [address]    Special Requests   Final    NONE Performed at [facility], [place] [address], [place], [place] [address]    Culture >=100,000 COLONIES/mL STAPHYLOCOCCUS AUREUS (A)  Final   Report Status 02/02/2021 FINAL  Final   Organism ID, Bacteria STAPHYLOCOCCUS AUREUS (A)  Final      Susceptibility   Staphylococcus aureus - MIC*    CIPROFLOXACIN >=8 RESISTANT Resistant  GENTAMICIN <=0.5 SENSITIVE Sensitive     NITROFURANTOIN <=16 SENSITIVE Sensitive     OXACILLIN 0.5 SENSITIVE Sensitive     TETRACYCLINE <=1 SENSITIVE Sensitive     VANCOMYCIN 1 SENSITIVE Sensitive     TRIMETH/SULFA <=10 SENSITIVE Sensitive     CLINDAMYCIN <=0.25 SENSITIVE Sensitive     RIFAMPIN <=0.5 SENSITIVE Sensitive     Inducible Clindamycin NEGATIVE Sensitive     * >=100,000 COLONIES/mL STAPHYLOCOCCUS AUREUS  Resp Panel by RT-PCR (Flu A&B, Covid) Nasopharyngeal Swab     Status: Abnormal   Collection Time: 01/31/21 10:59 AM   Specimen: Nasopharyngeal Swab; Nasopharyngeal(NP) swabs in vial transport medium  Result Value Ref Range Status   SARS Coronavirus 2 by RT PCR POSITIVE (A) NEGATIVE Final    Comment: RESULT CALLED TO, READ BACK BY AND VERIFIED WITH: GARRISON,G. RN AT 1206 01/31/21  MULLINS,T (NOTE) SARS-CoV-2 target nucleic acids are DETECTED.  The SARS-CoV-2 RNA is generally detectable in upper respiratory specimens during the acute phase of infection. Positive results are indicative of the presence of the identified virus, but do not rule out bacterial infection or co-infection with other pathogens not detected by the test. Clinical correlation with patient history and other diagnostic information is necessary to determine patient infection status. The expected result is Negative.  Fact Sheet for Patients: EntrepreneurPulse.com.au  Fact Sheet for Healthcare Providers: IncredibleEmployment.be  This test is not yet approved or cleared by the [place] FDA and  has been authorized for detection and/or diagnosis of SARS-CoV-2 by FDA under an Emergency Use Authorization (EUA).  This EUA will remain in effect (meaning this tes t can be used) for the duration of  the COVID-19 declaration under Section 564(b)(1) of the Act, 21 U.S.C. section 360bbb-3(b)(1), unless the authorization is terminated or revoked sooner.     Influenza A by PCR NEGATIVE NEGATIVE Final   Influenza B by PCR NEGATIVE NEGATIVE Final    Comment: (NOTE) The Xpert Xpress SARS-CoV-2/FLU/RSV plus assay is intended as an aid in the diagnosis of influenza from Nasopharyngeal swab specimens and should not be used as a sole basis for treatment. Nasal washings and aspirates are unacceptable for Xpert Xpress SARS-CoV-2/FLU/RSV testing.  Fact Sheet for Patients: EntrepreneurPulse.com.au  Fact Sheet for Healthcare Providers: IncredibleEmployment.be  This test is not yet approved or cleared by the [place] FDA and has been authorized for detection and/or diagnosis of SARS-CoV-2 by FDA under an Emergency Use Authorization (EUA). This EUA will remain in effect (meaning this test can be used) for the duration of  the COVID-19 declaration under Section 564(b)(1) of the Act, 21 U.S.C. section 360bbb-3(b)(1), unless the authorization is terminated or revoked.  Performed at [facility], [place] [address], [place], [place] [address]   Blood Culture (routine x 2)     Status: Abnormal   Collection Time: 01/31/21 10:59 AM   Specimen: BLOOD  Result Value Ref Range Status   Specimen Description   Final    BLOOD LEFT ANTECUBITAL Performed at [facility] [address], [place], [place] [address]    Special Requests   Final    BOTTLES DRAWN AEROBIC AND ANAEROBIC Blood Culture adequate volume Performed at [facility] [address] [place],  [address]    Culture  Setup Time   Final    GRAM POSITIVE COCCI IN CLUSTERS IN BOTH AEROBIC AND ANAEROBIC BOTTLES CRITICAL RESULT CALLED TO, READ BACK BY AND VERIFIED WITH: [place] L. [id number] FCP  Performed at [facility], [place] [address], [place], [place] [address]    Culture STAPHYLOCOCCUS AUREUS (A)  Final   Report Status 02/04/2021 FINAL  Final   Organism ID, Bacteria STAPHYLOCOCCUS AUREUS  Final      Susceptibility   Staphylococcus aureus - MIC*    CIPROFLOXACIN >=8 RESISTANT Resistant     ERYTHROMYCIN >=8 RESISTANT Resistant     GENTAMICIN <=0.5 SENSITIVE Sensitive     OXACILLIN 0.5 SENSITIVE Sensitive     TETRACYCLINE <=1 SENSITIVE Sensitive     VANCOMYCIN <=0.5 SENSITIVE Sensitive     TRIMETH/SULFA <=10 SENSITIVE Sensitive     CLINDAMYCIN <=0.25 SENSITIVE Sensitive     RIFAMPIN <=0.5 SENSITIVE Sensitive     Inducible Clindamycin NEGATIVE Sensitive     * STAPHYLOCOCCUS AUREUS  Blood Culture ID Panel (Reflexed)     Status: Abnormal   Collection Time: 01/31/21 10:59 AM  Result Value Ref Range Status   Enterococcus faecalis NOT DETECTED NOT DETECTED Final   Enterococcus Faecium NOT DETECTED NOT DETECTED Final   Listeria monocytogenes NOT DETECTED NOT DETECTED Final    Staphylococcus species DETECTED (A) NOT DETECTED Final    Comment: CRITICAL RESULT CALLED TO, READ BACK BY AND VERIFIED WITH: PHARMD MICHELLE L. [id number] FCP    Staphylococcus aureus (BCID) DETECTED (A) NOT DETECTED Final    Comment: CRITICAL RESULT CALLED TO, READ BACK BY AND VERIFIED WITH: PHARMD MICHELLE L. [id number] [id number] FCP    Staphylococcus epidermidis NOT DETECTED NOT DETECTED Final   Staphylococcus lugdunensis NOT DETECTED NOT DETECTED Final   Streptococcus species NOT DETECTED NOT DETECTED Final   Streptococcus agalactiae NOT DETECTED NOT DETECTED Final   Streptococcus pneumoniae NOT DETECTED NOT DETECTED Final   Streptococcus pyogenes NOT DETECTED NOT DETECTED Final   A.calcoaceticus-baumannii NOT DETECTED NOT DETECTED Final   Bacteroides fragilis NOT DETECTED NOT DETECTED Final   Enterobacterales NOT DETECTED NOT DETECTED Final   Enterobacter cloacae complex NOT DETECTED NOT DETECTED Final   Escherichia coli NOT DETECTED NOT DETECTED Final   Klebsiella aerogenes NOT DETECTED NOT DETECTED Final   Klebsiella oxytoca NOT DETECTED NOT DETECTED Final   Klebsiella pneumoniae NOT DETECTED NOT DETECTED Final   Proteus species NOT DETECTED NOT DETECTED Final   Salmonella species NOT DETECTED NOT DETECTED Final   Serratia marcescens NOT DETECTED NOT DETECTED Final   Haemophilus influenzae NOT DETECTED NOT DETECTED Final   Neisseria meningitidis NOT DETECTED NOT DETECTED Final   Pseudomonas aeruginosa NOT DETECTED NOT DETECTED Final   Stenotrophomonas maltophilia NOT DETECTED NOT DETECTED Final   Candida albicans NOT DETECTED NOT DETECTED Final   Candida auris NOT DETECTED NOT DETECTED Final   Candida glabrata NOT DETECTED NOT DETECTED Final   Candida krusei NOT DETECTED NOT DETECTED Final   Candida parapsilosis NOT DETECTED NOT DETECTED Final   Candida tropicalis NOT DETECTED NOT DETECTED Final   Cryptococcus neoformans/gattii NOT DETECTED NOT DETECTED Final   Meth resistant  mecA/C and MREJ NOT DETECTED NOT DETECTED Final    Comment: Performed at [facility], [address] [place], [place] [address]  Blood Culture (routine x 2)     Status: Abnormal   Collection Time: 01/31/21 11:15 AM   Specimen: BLOOD  Result Value Ref Range Status   Specimen Description   Final    BLOOD RIGHT ANTECUBITAL Performed at [place] [address] [place],  [address]    Special Requests   Final    BOTTLES DRAWN  AEROBIC AND ANAEROBIC Blood Culture adequate volume Performed at [facility] [address] [place], [place] [address]    Culture  Setup Time   Final    GRAM POSITIVE COCCI IN CLUSTERS IN BOTH AEROBIC AND ANAEROBIC BOTTLES CRITICAL VALUE NOTED.  VALUE IS CONSISTENT WITH PREVIOUSLY REPORTED AND CALLED VALUE.    Culture (A)  Final    STAPHYLOCOCCUS AUREUS SUSCEPTIBILITIES PERFORMED ON PREVIOUS CULTURE WITHIN THE LAST 5 DAYS. Performed at [facility], [place] [address], [place], [place] [address]    Report Status 02/03/2021 FINAL  Final  MRSA PCR Screening     Status: None   Collection Time: 01/31/21  2:28 PM   Specimen: Nasal Mucosa; Nasopharyngeal  Result Value Ref Range Status   MRSA by PCR NEGATIVE NEGATIVE Final    Comment:        The GeneXpert MRSA Assay (FDA approved for NASAL specimens only), is one component of a comprehensive MRSA colonization surveillance program. It is not intended to diagnose MRSA infection nor to guide or monitor treatment for MRSA infections. Performed at [facility], [place] [address], [place], [place] [address]   Culture, blood (routine x 2)     Status: None   Collection Time: 02/01/21  2:20 PM   Specimen: BLOOD  Result Value Ref Range Status   Specimen Description   Final    BLOOD RIGHT ANTECUBITAL Performed at [facility] [address], [place], [place] [address]    Special Requests   Final    BOTTLES DRAWN AEROBIC  AND ANAEROBIC Blood Culture adequate volume Performed at [place] [address] [place], [place] [address]    Culture   Final    NO GROWTH 5 DAYS Performed at [facility], [place] [address] [place], [place] [address]    Report Status 02/06/2021 FINAL  Final  Culture, blood (routine x 2)     Status: None   Collection Time: 02/01/21  2:20 PM   Specimen: BLOOD RIGHT HAND  Result Value Ref Range Status   Specimen Description   Final    BLOOD RIGHT HAND Performed at [facility] [address], [place], [place] [address]    Special Requests   Final    BOTTLES DRAWN AEROBIC AND ANAEROBIC Blood Culture adequate volume Performed at [facility] [address], [place], [place] [address]    Culture   Final    NO GROWTH 5 DAYS Performed at [facility], [place] [address] [place], [place] [address]    Report Status 02/06/2021 FINAL  Final  Aerobic/Anaerobic Culture w Gram Stain (surgical/deep wound)     Status: None   Collection Time: 02/02/21  4:12 PM   Specimen: Abscess  Result Value Ref Range Status   Specimen Description   Final    ABSCESS SUBSCAPULAR Performed at [facility] [address], [place], [place] [address]    Special Requests   Final    NONE Performed at [facility], [place] [address] [place], [place] [address]    Gram Stain   Final    ABUNDANT WBC PRESENT, PREDOMINANTLY PMN ABUNDANT GRAM POSITIVE COCCI IN CLUSTERS IN TETRADS    Culture   Final    ABUNDANT STAPHYLOCOCCUS AUREUS NO ANAEROBES ISOLATED Performed at [facility], [place] [address], [place], [place] [address]    Report Status 02/07/2021 FINAL  Final  Organism ID, Bacteria STAPHYLOCOCCUS AUREUS  Final      Susceptibility   Staphylococcus aureus - MIC*    CIPROFLOXACIN >=8 RESISTANT Resistant     ERYTHROMYCIN >=8 RESISTANT Resistant     GENTAMICIN <=0.5 SENSITIVE Sensitive      OXACILLIN 0.5 SENSITIVE Sensitive     TETRACYCLINE <=1 SENSITIVE Sensitive     VANCOMYCIN <=0.5 SENSITIVE Sensitive     TRIMETH/SULFA <=10 SENSITIVE Sensitive     CLINDAMYCIN <=0.25 SENSITIVE Sensitive     RIFAMPIN <=0.5 SENSITIVE Sensitive     Inducible Clindamycin NEGATIVE Sensitive     * ABUNDANT STAPHYLOCOCCUS AUREUS  Fungus Culture With Stain     Status: None   Collection Time: 02/08/21  6:48 PM   Specimen: Synovial, Left Shoulder; Body Fluid  Result Value Ref Range Status   Fungus Stain Final report  Final   Fungus (Mycology) Culture Final report  Final    Comment: (NOTE) Performed At: [facility] [place], [place] [id number] Rush Farmer MD [id number]    Fungal Source SHOULDER  Final    Comment: LEFT Performed at [facility] [address] [place], [place] [address]   Aerobic/Anaerobic Culture w Gram Stain (surgical/deep wound)     Status: None   Collection Time: 02/08/21  6:48 PM   Specimen: Synovial, Left Shoulder; Body Fluid  Result Value Ref Range Status   Specimen Description   Final    SHOULDER LEFT Performed at [place] [address] [place], [place] [address]    Special Requests   Final    NONE Performed at [facility], [place] [address], [place], [place] [address]    Gram Stain   Final    ABUNDANT WBC PRESENT,BOTH PMN AND MONONUCLEAR RARE GRAM POSITIVE COCCI IN PAIRS    Culture   Final    RARE STAPHYLOCOCCUS AUREUS NO ANAEROBES ISOLATED Performed at [facility], [place] [address] [place], [place] [address]    Report Status 02/14/2021 FINAL  Final   Organism ID, Bacteria STAPHYLOCOCCUS AUREUS  Final      Susceptibility   Staphylococcus aureus - MIC*    CIPROFLOXACIN >=8 RESISTANT Resistant     ERYTHROMYCIN >=8 RESISTANT Resistant     GENTAMICIN <=0.5 SENSITIVE Sensitive     OXACILLIN 0.5 SENSITIVE Sensitive     TETRACYCLINE <=1 SENSITIVE Sensitive      VANCOMYCIN 1 SENSITIVE Sensitive     TRIMETH/SULFA <=10 SENSITIVE Sensitive     CLINDAMYCIN <=0.25 SENSITIVE Sensitive     RIFAMPIN <=0.5 SENSITIVE Sensitive     Inducible Clindamycin NEGATIVE Sensitive     * RARE STAPHYLOCOCCUS AUREUS  Fungus Culture Result     Status: None   Collection Time: 02/08/21  6:48 PM  Result Value Ref Range Status   Result 1 Comment  Final    Comment: (NOTE) KOH/Calcofluor preparation:  no fungus observed. Performed At: [facility] [place], [place] [id number] Rush Farmer MD [id number]   Fungal organism reflex     Status: None   Collection Time: 02/08/21  6:48 PM  Result Value Ref Range Status   Fungal result 1 Comment  Final    Comment: (NOTE) No yeast or mold isolated after 4 weeks. Performed At: [facility] [place], [place] [id number] Rush Farmer MD [id number]   Fungus Culture With Stain     Status: None   Collection Time: 02/08/21  6:52 PM  Specimen: Joint, Other; Body Fluid  Result Value Ref Range Status   Fungus Stain Final report  Final   Fungus (Mycology) Culture Final report  Final    Comment: (NOTE) Performed At: [facility] [address] [place], [place] [id number] Rush Farmer MD [id number]    Fungal Source SHOULDER  Final    Comment: LEFT BONE CHIPS Performed at [facility] [address], [place], [place] [address]   Aerobic/Anaerobic Culture w Gram Stain (surgical/deep wound)     Status: None   Collection Time: 02/08/21  6:52 PM   Specimen: Joint, Other; Body Fluid  Result Value Ref Range Status   Specimen Description   Final    SHOULDER LEFT BONE CHIPS Performed at [facility] [address], [place], [place] [address]    Special Requests   Final    NONE Performed at [facility], [place] [address], [place], [place] [address]    Gram Stain NO WBC SEEN NO ORGANISMS SEEN   Final    Culture   Final    RARE STAPHYLOCOCCUS AUREUS NO ANAEROBES ISOLATED Performed at [facility], [place] [address], [place], [place] [address]    Report Status 02/14/2021 FINAL  Final   Organism ID, Bacteria STAPHYLOCOCCUS AUREUS  Final      Susceptibility   Staphylococcus aureus - MIC*    CIPROFLOXACIN >=8 RESISTANT Resistant     ERYTHROMYCIN >=8 RESISTANT Resistant     GENTAMICIN <=0.5 SENSITIVE Sensitive     OXACILLIN 0.5 SENSITIVE Sensitive     TETRACYCLINE <=1 SENSITIVE Sensitive     VANCOMYCIN 1 SENSITIVE Sensitive     TRIMETH/SULFA <=10 SENSITIVE Sensitive     CLINDAMYCIN <=0.25 SENSITIVE Sensitive     RIFAMPIN <=0.5 SENSITIVE Sensitive     Inducible Clindamycin NEGATIVE Sensitive     * RARE STAPHYLOCOCCUS AUREUS  Fungus Culture Result     Status: None   Collection Time: 02/08/21  6:52 PM  Result Value Ref Range Status   Result 1 Comment  Final    Comment: (NOTE) KOH/Calcofluor preparation:  no fungus observed. Performed At: [facility] [place], [place] [id number] Rush Farmer MD [id number]   Fungal organism reflex     Status: None   Collection Time: 02/08/21  6:52 PM  Result Value Ref Range Status   Fungal result 1 Comment  Final    Comment: (NOTE) No yeast or mold isolated after 4 weeks. Performed At: [facility] [place], [place] [id number] Rush Farmer MD [id number]   Aerobic/Anaerobic Culture w Gram Stain (surgical/deep wound)     Status: None   Collection Time: 02/10/21 10:22 AM   Specimen: Wound  Result Value Ref Range Status   Specimen Description   Final    WOUND CHEST Performed at [facility] [address] [place], [place] [address]    Special Requests   Final    NONE Performed at [facility], [place] [address], [place], [place] [address]    Gram Stain   Final    ABUNDANT WBC PRESENT,BOTH PMN AND MONONUCLEAR MODERATE GRAM POSITIVE COCCI    Culture    Final    ABUNDANT STAPHYLOCOCCUS AUREUS NO ANAEROBES ISOLATED Performed at [facility], [place] [address] [place], [place] [address]    Report Status 02/15/2021 FINAL  Final   Organism ID, Bacteria STAPHYLOCOCCUS AUREUS  Final      Susceptibility  Staphylococcus aureus - MIC*    CIPROFLOXACIN >=8 RESISTANT Resistant     ERYTHROMYCIN >=8 RESISTANT Resistant     GENTAMICIN <=0.5 SENSITIVE Sensitive     OXACILLIN 0.5 SENSITIVE Sensitive     TETRACYCLINE <=1 SENSITIVE Sensitive     VANCOMYCIN 1 SENSITIVE Sensitive     TRIMETH/SULFA <=10 SENSITIVE Sensitive     CLINDAMYCIN <=0.25 SENSITIVE Sensitive     RIFAMPIN <=0.5 SENSITIVE Sensitive     Inducible Clindamycin NEGATIVE Sensitive     * ABUNDANT STAPHYLOCOCCUS AUREUS  Culture, blood (single)     Status: None   Collection Time: 02/13/21  5:51 AM   Specimen: BLOOD  Result Value Ref Range Status   Specimen Description   Final    BLOOD RIGHT ANTECUBITAL Performed at [facility] [address], [place], [place] [address]    Special Requests   Final    BOTTLES DRAWN AEROBIC ONLY Blood Culture adequate volume Performed at [facility] [address] [place], [place] [address]    Culture   Final    NO GROWTH 5 DAYS Performed at [facility], [place] [address] [place], [place] [address]    Report Status 02/18/2021 FINAL  Final  Culture, blood (single)     Status: None   Collection Time: 02/14/21  2:12 AM   Specimen: BLOOD  Result Value Ref Range Status   Specimen Description   Final    BLOOD RIGHT ANTECUBITAL Performed at [facility] [address] [place], [place] [address]    Special Requests   Final    BOTTLES DRAWN AEROBIC AND ANAEROBIC Blood Culture adequate volume Performed at [facility] [address] [place] [address], [place], [place] [address]    Culture   Final    NO GROWTH 5 DAYS Performed at [facility], [place] [address]  [place], [place] [address]    Report Status 02/19/2021 FINAL  Final  Aerobic/Anaerobic Culture w Gram Stain (surgical/deep wound)     Status: None   Collection Time: 02/15/21  5:02 PM   Specimen: Abscess  Result Value Ref Range Status   Specimen Description   Final    ABSCESS LEFT SHOULDER Performed at [facility] [address] [place], [place] [address]    Special Requests   Final    NONE Performed at [facility], [place] [address], [place], [place] [address]    Gram Stain NO WBC SEEN NO ORGANISMS SEEN   Final   Culture   Final    FEW STAPHYLOCOCCUS AUREUS SUSCEPTIBILITIES PERFORMED ON PREVIOUS CULTURE WITHIN THE LAST 5 DAYS. NO ANAEROBES ISOLATED Performed at [facility], [place] [address] [place], [place] [address]    Report Status 02/21/2021 FINAL  Final  Aerobic/Anaerobic Culture w Gram Stain (surgical/deep wound)     Status: None   Collection Time: 02/15/21  5:06 PM   Specimen: Abscess  Result Value Ref Range Status   Specimen Description   Final    ABSCESS POST US GUIDED LEFT SUPRAVICULAR ABSCESS DRAIN [place] Performed at [facility] [address], [place], [place] [address]    Special Requests   Final    NONE Performed at [facility], [place] [address], [place], [place] [address]    Gram Stain   Final    FEW WBC PRESENT,BOTH PMN AND MONONUCLEAR FEW GRAM POSITIVE COCCI IN PAIRS    Culture   Final  ABUNDANT STAPHYLOCOCCUS AUREUS NO ANAEROBES ISOLATED Performed at [facility], [place] [address] [place], [place] [address]    Report Status 02/21/2021 FINAL  Final   Organism ID, Bacteria STAPHYLOCOCCUS AUREUS  Final      Susceptibility   Staphylococcus aureus - MIC*    CIPROFLOXACIN >=8 RESISTANT Resistant     ERYTHROMYCIN >=8 RESISTANT Resistant     GENTAMICIN <=0.5 SENSITIVE Sensitive     OXACILLIN 0.5 SENSITIVE Sensitive     TETRACYCLINE <=1 SENSITIVE Sensitive      VANCOMYCIN <=0.5 SENSITIVE Sensitive     TRIMETH/SULFA <=10 SENSITIVE Sensitive     CLINDAMYCIN <=0.25 SENSITIVE Sensitive     RIFAMPIN <=0.5 SENSITIVE Sensitive     Inducible Clindamycin NEGATIVE Sensitive     * ABUNDANT STAPHYLOCOCCUS AUREUS  Aerobic/Anaerobic Culture w Gram Stain (surgical/deep wound)     Status: None   Collection Time: 02/28/21  4:11 PM   Specimen: PATH Soft tissue  Result Value Ref Range Status   Specimen Description   Final    TISSUE LEFT SHOULDER A Performed at [facility] [address], [place], [place] [address]    Special Requests   Final    NONE Performed at [facility], [place] [address] [place], [place] [address]    Gram Stain   Final    RARE WBC PRESENT, PREDOMINANTLY PMN NO ORGANISMS SEEN    Culture   Final    RARE PSEUDOMONAS AERUGINOSA NO ANAEROBES ISOLATED Performed at [facility], [place] [address], [place], [place] [address]    Report Status 03/05/2021 FINAL  Final   Organism ID, Bacteria PSEUDOMONAS AERUGINOSA  Final      Susceptibility   Pseudomonas aeruginosa - MIC*    CEFTAZIDIME 4 SENSITIVE Sensitive     CIPROFLOXACIN <=0.25 SENSITIVE Sensitive     GENTAMICIN <=1 SENSITIVE Sensitive     IMIPENEM 1 SENSITIVE Sensitive     PIP/TAZO 8 SENSITIVE Sensitive     CEFEPIME 2 SENSITIVE Sensitive     * RARE PSEUDOMONAS AERUGINOSA  Aerobic/Anaerobic Culture w Gram Stain (surgical/deep wound)     Status: None   Collection Time: 02/28/21  4:12 PM   Specimen: PATH Soft tissue  Result Value Ref Range Status   Specimen Description   Final    TISSUE  RIGHT SHOULDER Performed at [facility] [address] [place], [place] [address]    Special Requests   Final    NONE Performed at [facility], [place] [address], [place], [place] [address]    Gram Stain NO WBC SEEN NO ORGANISMS SEEN   Final   Culture   Final    No growth aerobically or anaerobically. Performed  at [facility], [place] [address], [place], [place] [address]    Report Status 03/05/2021 FINAL  Final  Surgical pcr screen     Status: None   Collection Time: 03/06/21  3:07 AM   Specimen: Nasal Mucosa; Nasal Swab  Result Value Ref Range Status   MRSA, PCR NEGATIVE NEGATIVE Final   Staphylococcus aureus NEGATIVE NEGATIVE Final    Comment: (NOTE) The Xpert SA Assay (FDA approved for NASAL specimens in patients 70 years of age and older), is one component of a comprehensive surveillance program. It is not intended to diagnose infection nor to guide or monitor treatment. Performed at [facility], [place] [address], [place], [place] [address]      Pertinent  Imaging All pertinent labs/Imagings/notes reviewed. All pertinent plain films and CT images have been personally visualized and interpreted; radiology reports have been reviewed. Decision making incorporated into the Impression / Recommendations.  I have spent a total of 60 minutes of face-to-face and non-face-to-face time, excluding clinical staff time, preparing to see patient, ordering tests and/or medications, and provide counseling the patient    Electronically signed by:  Rosiland Oz, MD Infectious Disease Physician [facility] [address] [place], [place] [address] Phone: [phone]  Fax: [phone]

## 2021-04-10 ENCOUNTER — Telehealth: Payer: Self-pay

## 2021-04-10 LAB — COMPREHENSIVE METABOLIC PANEL
AG Ratio: 1 (calc) (ref 1.0–2.5)
ALT: 9 U/L (ref 9–46)
AST: 10 U/L (ref 10–35)
Albumin: 3.8 g/dL (ref 3.6–5.1)
Alkaline phosphatase (APISO): 128 U/L (ref 35–144)
BUN/Creatinine Ratio: 21 (calc) (ref 6–22)
BUN: 12 mg/dL (ref 7–25)
CO2: 29 mmol/L (ref 20–32)
Calcium: 9.6 mg/dL (ref 8.6–10.3)
Chloride: 98 mmol/L (ref 98–110)
Creat: 0.57 mg/dL — ABNORMAL LOW (ref 0.70–1.30)
Globulin: 3.9 g/dL (calc) — ABNORMAL HIGH (ref 1.9–3.7)
Glucose, Bld: 261 mg/dL — ABNORMAL HIGH (ref 65–99)
Potassium: 4.3 mmol/L (ref 3.5–5.3)
Sodium: 136 mmol/L (ref 135–146)
Total Bilirubin: 0.4 mg/dL (ref 0.2–1.2)
Total Protein: 7.7 g/dL (ref 6.1–8.1)

## 2021-04-10 LAB — CBC
HCT: 35.1 % — ABNORMAL LOW (ref 38.5–50.0)
Hemoglobin: 11.2 g/dL — ABNORMAL LOW (ref 13.2–17.1)
MCH: 27.3 pg (ref 27.0–33.0)
MCHC: 31.9 g/dL — ABNORMAL LOW (ref 32.0–36.0)
MCV: 85.6 fL (ref 80.0–100.0)
MPV: 9 fL (ref 7.5–12.5)
Platelets: 518 10*3/uL — ABNORMAL HIGH (ref 140–400)
RBC: 4.1 10*6/uL — ABNORMAL LOW (ref 4.20–5.80)
RDW: 15 % (ref 11.0–15.0)
WBC: 10.4 10*3/uL (ref 3.8–10.8)

## 2021-04-10 LAB — SEDIMENTATION RATE: Sed Rate: 77 mm/h — ABNORMAL HIGH (ref 0–20)

## 2021-04-10 LAB — C-REACTIVE PROTEIN: CRP: 108.4 mg/L — ABNORMAL HIGH (ref <8.0)

## 2021-04-10 NOTE — Telephone Encounter (Signed)
-----   Message from Odette Fraction, MD sent at 04/09/2021  7:43 PM EDT ----- Regarding: Regarding change of antibiotics Please let patient know that I have sent in a prescription of cephalexin 500 mg three times a day instead of Doxycyline 100 mg two times a day ( which I said I will be prescribing to the patient in the clinic visit ) as he will not have to worry about sun burn with cephalexin. Thanks.

## 2021-04-10 NOTE — Telephone Encounter (Signed)
Spoke with patient, relayed that Dr. Elinor Parkinson would like him to stop the doxycyline and switch to cephalexin 3 times a day to avoid concern for sunburn. Patient verbalized understanding and has no further questions.   [name], RN

## 2021-04-16 ENCOUNTER — Ambulatory Visit: Payer: Commercial Managed Care - PPO | Attending: Internal Medicine | Admitting: Physical Therapy

## 2021-04-23 ENCOUNTER — Encounter: Payer: Self-pay | Admitting: Family

## 2021-04-23 ENCOUNTER — Ambulatory Visit (INDEPENDENT_AMBULATORY_CARE_PROVIDER_SITE_OTHER): Payer: Commercial Managed Care - PPO | Admitting: Family

## 2021-04-23 ENCOUNTER — Other Ambulatory Visit: Payer: Self-pay

## 2021-04-23 VITALS — BP 125/75 | HR 72 | Temp 97.1°F | Ht 70.0 in | Wt 182.8 lb

## 2021-04-23 DIAGNOSIS — M51379 Other intervertebral disc degeneration, lumbosacral region without mention of lumbar back pain or lower extremity pain: Secondary | ICD-10-CM

## 2021-04-23 DIAGNOSIS — F339 Major depressive disorder, recurrent, unspecified: Secondary | ICD-10-CM

## 2021-04-23 DIAGNOSIS — E1169 Type 2 diabetes mellitus with other specified complication: Secondary | ICD-10-CM | POA: Diagnosis not present

## 2021-04-23 DIAGNOSIS — E1142 Type 2 diabetes mellitus with diabetic polyneuropathy: Secondary | ICD-10-CM

## 2021-04-23 DIAGNOSIS — E785 Hyperlipidemia, unspecified: Secondary | ICD-10-CM

## 2021-04-23 DIAGNOSIS — M5137 Other intervertebral disc degeneration, lumbosacral region: Secondary | ICD-10-CM

## 2021-04-23 DIAGNOSIS — I10 Essential (primary) hypertension: Secondary | ICD-10-CM | POA: Diagnosis not present

## 2021-04-23 DIAGNOSIS — D649 Anemia, unspecified: Secondary | ICD-10-CM

## 2021-04-23 DIAGNOSIS — E663 Overweight: Secondary | ICD-10-CM

## 2021-04-23 LAB — BAYER DCA HB A1C WAIVED: HB A1C (BAYER DCA - WAIVED): 8 % — ABNORMAL HIGH (ref <7.0)

## 2021-04-23 MED ORDER — METHOCARBAMOL 500 MG PO TABS: 500.0000 mg | ORAL_TABLET | Freq: Two times a day (BID) | ORAL | 2 refills | Status: DC | PRN

## 2021-04-23 MED ORDER — FERROUS SULFATE 325 (65 FE) MG PO TABS: 325.0000 mg | ORAL_TABLET | Freq: Two times a day (BID) | ORAL | 3 refills | Status: DC

## 2021-04-23 NOTE — Progress Notes (Signed)
Subjective:    Patient ID: John Ware, male    DOB: 1966-01-19, 55 y.o.   MRN: [id number]  Chief Complaint  Patient presents with   Follow-up   Fever    Low grade for over a week taken Tylenol    PT presents to the office today for chronic follow up. He is followed by Pain Clinic every month for chronic back pain. Has hx of lumbar stenosis.    He is followed by ID and has a follow up with them tomorrow for follow up on psoas muscle abscess. He is followed by Ortho.  He reports he has been having a low grade fever.  Hypertension This is a chronic problem. The current episode started more than 1 year ago. The problem has been resolved since onset. The problem is controlled. Associated symptoms include anxiety and malaise/fatigue. Pertinent negatives include no blurred vision, peripheral edema or shortness of breath. The current treatment provides moderate improvement. There is no history of kidney disease.  Hyperlipidemia This is a chronic problem. The current episode started more than 1 year ago. The problem is uncontrolled. Pertinent negatives include no shortness of breath. Current antihyperlipidemic treatment includes diet change. The current treatment provides moderate improvement of lipids. Risk factors for coronary artery disease include dyslipidemia, diabetes mellitus, hypertension, male sex and a sedentary lifestyle.  Diabetes He presents for his follow-up diabetic visit. He has type 2 diabetes mellitus. Hypoglycemia symptoms include nervousness/anxiousness. Associated symptoms include foot paresthesias. Pertinent negatives for diabetes include no blurred vision. Symptoms are stable. Diabetic complications include heart disease and peripheral neuropathy. Risk factors for coronary artery disease include diabetes mellitus, hypertension, sedentary lifestyle, family history and dyslipidemia. He is following a generally unhealthy diet. His overall blood glucose range is 140-180 mg/dl.   Depression        This is a chronic problem.  The current episode started more than 1 year ago.   The problem occurs intermittently.  Associated symptoms include helplessness, hopelessness, irritable, restlessness and sad.  Past medical history includes anxiety.   Anxiety Presents for follow-up visit. Symptoms include depressed mood, excessive worry, irritability, nervous/anxious behavior and restlessness. Patient reports no shortness of breath. The severity of symptoms is moderate.   His past medical history is significant for anemia.  Anemia Presents for follow-up visit. Symptoms include malaise/fatigue. There has been no leg swelling.     Review of Systems  Constitutional:  Positive for irritability and malaise/fatigue.  Eyes:  Negative for blurred vision.  Respiratory:  Negative for shortness of breath.   Psychiatric/Behavioral:  Positive for depression. The patient is nervous/anxious.   All other systems reviewed and are negative.     Objective:   Physical Exam Vitals reviewed.  Constitutional:      General: He is irritable. He is not in acute distress.    Appearance: He is well-developed.  HENT:     Head: Normocephalic.     Right Ear: Tympanic membrane normal.     Left Ear: Tympanic membrane normal.  Eyes:     General:        Right eye: No discharge.        Left eye: No discharge.     Pupils: Pupils are equal, round, and reactive to light.  Neck:     Thyroid: No thyromegaly.  Cardiovascular:     Rate and Rhythm: Normal rate and regular rhythm.     Heart sounds: Normal heart sounds. No murmur heard. Pulmonary:  Effort: Pulmonary effort is normal. No respiratory distress.     Breath sounds: Normal breath sounds. No wheezing.  Abdominal:     General: Bowel sounds are normal. There is no distension.     Palpations: Abdomen is soft.     Tenderness: There is no abdominal tenderness.  Musculoskeletal:        General: No tenderness. Normal range of motion.      Cervical back: Normal range of motion and neck supple.  Skin:    General: Skin is warm and dry.     Findings: No erythema or rash.  Neurological:     Mental Status: He is alert and oriented to person, place, and time.     Cranial Nerves: No cranial nerve deficit.     Motor: Weakness present.     Deep Tendon Reflexes: Reflexes are normal and symmetric.  Psychiatric:        Behavior: Behavior normal.        Thought Content: Thought content normal.        Judgment: Judgment normal.     BP 125/75   Pulse 72   Temp (!) 97.1 F (36.2 C) (Temporal)   Ht '5\' 10"'  (1.778 m)   Wt 182 lb 12.8 oz (82.9 kg)   SpO2 99%   BMI 26.23 kg/m      Assessment & Plan:  John Ware comes in today with chief complaint of Follow-up and Fever (Low grade for over a week taken Tylenol )   Diagnosis and orders addressed:  1. Primary hypertension - CMP14+EGFR - CBC with Differential/Platelet  2. Hyperlipidemia associated with type 2 diabetes mellitus ([place]) - CMP14+EGFR - CBC with Differential/Platelet - Lipid panel  3. Type 2 diabetes mellitus with other specified complication, without long-term current use of insulin (HCC) - CMP14+EGFR - CBC with Differential/Platelet - Bayer DCA Hb A1c Waived - Microalbumin / creatinine urine ratio  4. Diabetic polyneuropathy associated with type 2 diabetes mellitus (HCC) - CMP14+EGFR - CBC with Differential/Platelet  5. DDD (degenerative disc disease), lumbosacral - methocarbamol (ROBAXIN) 500 MG tablet; Take 1 tablet (500 mg total) by mouth 2 (two) times daily as needed for muscle spasms.  Dispense: 60 tablet; Refill: 2 - CMP14+EGFR - CBC with Differential/Platelet  6. Depression, recurrent ([place]) - CMP14+EGFR - CBC with Differential/Platelet  7. Overweight (BMI 25.0-29.9) - CMP14+EGFR - CBC with Differential/Platelet  8. Anemia, unspecified type - ferrous sulfate 325 (65 FE) MG tablet; Take 1 tablet (325 mg total) by mouth 2 (two) times  daily with a meal.; Refill: 3 - CMP14+EGFR - CBC with Differential/Platelet   Labs pending Health Maintenance reviewed Diet and exercise encouraged  Follow up plan: 3 months   Evelina Dun, FNP

## 2021-04-23 NOTE — Patient Instructions (Signed)

## 2021-04-24 ENCOUNTER — Other Ambulatory Visit: Payer: Self-pay | Admitting: Family

## 2021-04-24 LAB — CBC WITH DIFFERENTIAL/PLATELET
Basophils Absolute: 0 10*3/uL (ref 0.0–0.2)
Basos: 0 %
EOS (ABSOLUTE): 0.1 10*3/uL (ref 0.0–0.4)
Eos: 1 %
Hematocrit: 34.1 % — ABNORMAL LOW (ref 37.5–51.0)
Hemoglobin: 11.3 g/dL — ABNORMAL LOW (ref 13.0–17.7)
Immature Grans (Abs): 0 10*3/uL (ref 0.0–0.1)
Immature Granulocytes: 0 %
Lymphocytes Absolute: 1.7 10*3/uL (ref 0.7–3.1)
Lymphs: 21 %
MCH: 26.6 pg (ref 26.6–33.0)
MCHC: 33.1 g/dL (ref 31.5–35.7)
MCV: 80 fL (ref 79–97)
Monocytes Absolute: 0.7 10*3/uL (ref 0.1–0.9)
Monocytes: 8 %
Neutrophils Absolute: 5.8 10*3/uL (ref 1.4–7.0)
Neutrophils: 70 %
Platelets: 765 10*3/uL — ABNORMAL HIGH (ref 150–450)
RBC: 4.25 x10E6/uL (ref 4.14–5.80)
RDW: 14.5 % (ref 11.6–15.4)
WBC: 8.4 10*3/uL (ref 3.4–10.8)

## 2021-04-24 LAB — CMP14+EGFR
ALT: 10 IU/L (ref 0–44)
AST: 10 IU/L (ref 0–40)
Albumin/Globulin Ratio: 0.9 — ABNORMAL LOW (ref 1.2–2.2)
Albumin: 3.6 g/dL — ABNORMAL LOW (ref 3.8–4.9)
Alkaline Phosphatase: 113 IU/L (ref 44–121)
BUN/Creatinine Ratio: 13 (ref 9–20)
BUN: 8 mg/dL (ref 6–24)
Bilirubin Total: <0.2 mg/dL (ref 0.0–1.2)
CO2: 25 mmol/L (ref 20–29)
Calcium: 9.4 mg/dL (ref 8.7–10.2)
Chloride: 98 mmol/L (ref 96–106)
Creatinine, Ser: 0.64 mg/dL — ABNORMAL LOW (ref 0.76–1.27)
Globulin, Total: 3.9 g/dL (ref 1.5–4.5)
Glucose: 205 mg/dL — ABNORMAL HIGH (ref 65–99)
Potassium: 4.9 mmol/L (ref 3.5–5.2)
Sodium: 137 mmol/L (ref 134–144)
Total Protein: 7.5 g/dL (ref 6.0–8.5)
eGFR: 113 mL/min/{1.73_m2} (ref >59)

## 2021-04-24 LAB — MICROALBUMIN / CREATININE URINE RATIO
Creatinine, Urine: 129 mg/dL
Microalb/Creat Ratio: 112 mg/g creat — ABNORMAL HIGH (ref 0–29)
Microalbumin, Urine: 144.7 ug/mL

## 2021-04-24 LAB — LIPID PANEL
Chol/HDL Ratio: 4.1 ratio (ref 0.0–5.0)
Cholesterol, Total: 143 mg/dL (ref 100–199)
HDL: 35 mg/dL — ABNORMAL LOW (ref >39)
LDL Chol Calc (NIH): 87 mg/dL (ref 0–99)
Triglycerides: 112 mg/dL (ref 0–149)
VLDL Cholesterol Cal: 21 mg/dL (ref 5–40)

## 2021-04-24 MED ORDER — EMPAGLIFLOZIN 10 MG PO TABS: 10.0000 mg | ORAL_TABLET | Freq: Every day | ORAL | 1 refills | Status: DC

## 2021-04-25 ENCOUNTER — Telehealth: Payer: Self-pay | Admitting: Family Medicine

## 2021-04-25 NOTE — Telephone Encounter (Signed)
This request has been approved.  Approvedtoday CaseId:[id number];Status:Approved;Review Type:Prior Auth;Coverage Start Date:03/26/2021;Coverage End Date:04/25/2022;  Please note any additional information provided by [employer] at the bottom of your screen.  [facility] pharmacy aware .

## 2021-05-09 ENCOUNTER — Ambulatory Visit (INDEPENDENT_AMBULATORY_CARE_PROVIDER_SITE_OTHER): Payer: Commercial Managed Care - PPO | Admitting: Infectious Diseases

## 2021-05-09 ENCOUNTER — Other Ambulatory Visit: Payer: Self-pay

## 2021-05-09 DIAGNOSIS — Z5181 Encounter for therapeutic drug level monitoring: Secondary | ICD-10-CM

## 2021-05-09 DIAGNOSIS — A4901 Methicillin susceptible Staphylococcus aureus infection, unspecified site: Secondary | ICD-10-CM | POA: Diagnosis not present

## 2021-05-09 NOTE — Progress Notes (Signed)
Virtual Visit via Telephone Note  I connected withNAME@ on 05/09/21 at 11:00 AM EDT by a telephone enabled telemedicine application and verified that I am speaking with the correct person using two identifiers.  Location: Patient: Home  Provider: [facility]   I discussed the limitations of evaluation and management by telemedicine and the availability of in person appointments. The patient expressed understanding and agreed to proceed.  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   Pressure injury of skin 02/01/2021   HTN (hypertension) 02/01/2021   Anxiety 02/01/2021   Left shoulder pain 02/01/2021   Hyponatremia 01/31/2021   Positive colorectal cancer screening using Cologuard test 12/28/2020   DOE (dyspnea on exertion) 10/20/2020   Diabetic polyneuropathy associated with type 2 diabetes mellitus (HCC) 10/09/2020   Diabetes mellitus (HCC) 10/30/2018   Lumbar stenosis with neurogenic claudication 10/14/2017   DDD (degenerative disc disease), lumbar 02/17/2017   Left leg weakness 02/05/2017   Hyperlipidemia associated with type 2 diabetes mellitus (HCC) 10/24/2016   Depression, recurrent (HCC) 10/24/2016   Right knee pain 05/21/2016   DDD (degenerative disc disease), lumbosacral 05/21/2016   Overweight (BMI 25.0-29.9) 05/21/2016   Lumbar stenosis 12/13/2014    Patient's Medications  New Prescriptions   No medications on file  Previous Medications   ACETAMINOPHEN (TYLENOL) 500 MG TABLET    Take 1 tablet (500 mg total) by mouth 4 (four) times daily.   AMLODIPINE (NORVASC) 5 MG TABLET    Take  1 tablet (5 mg total) by mouth daily.   ASCORBIC ACID (VITAMIN C) 500 MG TABLET    Take 1 tablet (500 mg total) by mouth 2 (two) times daily.   BUPROPION (WELLBUTRIN XL) 150 MG 24 HR TABLET    Take 1 tablet (150 mg total) by mouth 2 (two) times daily.   CEPHALEXIN (KEFLEX) 500 MG CAPSULE    Take 1 capsule (500 mg total) by mouth 3 (three) times daily.   CONTINUOUS BLOOD GLUC SENSOR (FREESTYLE LIBRE 2 SENSOR) MISC    Needs to check BS QID   EMPAGLIFLOZIN (JARDIANCE) 10 MG TABS TABLET    Take 1 tablet (10 mg total) by mouth daily before breakfast.   FERROUS SULFATE 325 (65 FE) MG TABLET    Take 1 tablet (325 mg total) by mouth 2 (two) times daily with a meal.   GABAPENTIN (NEURONTIN) 800 MG TABLET    Take 1 tablet (800 mg total) by mouth 3 (three) times daily.   MAGIC MOUTHWASH SOLN    Take 5 mLs by mouth 4 (four) times daily.   METFORMIN (GLUCOPHAGE XR) 750 MG 24 HR TABLET    Take 2 tablets (1,500 mg total) by mouth daily with breakfast.   METHOCARBAMOL (ROBAXIN) 500 MG TABLET    Take 1 tablet (500 mg total) by mouth 2 (two) times daily as needed for muscle spasms.   OXYCODONE HCL 20 MG TABS    Take 20 mg by mouth 4 (four) times daily as needed.   VITAMIN D, ERGOCALCIFEROL, (DRISDOL) 1.25 MG (50000 UNIT) CAPS CAPSULE    Take 50,000 Units  by mouth once a week.  Modified Medications   No medications on file  Discontinued Medications   No medications on file    History of Present Illness: Here for phone visit in the setting of prior disseminated MSSA infection ( see prior notes). This was supposed to be an in person visit. However, it became a phone visit as patient had forgotten about the visit. He says he is taking cehalexin three times a day. Denies any issues with the antibiotics like nausea, vomiting, abdominal pain, rashes and diarrhea. Denies any fevers, chills and sweats. He was also seen by Orthopedics and was told everything looked good. Discussed with him to follow up in 2-3 weeks for labs  and in person follow up.   ROS  Negative for fevers, chills  Negative for nausea, vomiting and diarrhea Negative for chest pain, cough   Past Medical History:  Diagnosis Date   Depression    Diabetes mellitus without complication (HCC)    Hypertension    Spinal stenosis    With Neurogenic Claudication    Social History   Tobacco Use   Smoking status: Former    Packs/day: 2.00    Years: 15.00    Pack years: 30.00    Types: Cigarettes    Quit date: 2012    Years since quitting: 10.7   Smokeless tobacco: Never  Vaping Use   Vaping Use: Never used  Substance Use Topics   Alcohol use: Yes    Comment: occasional   Drug use: No    Family History  Problem Relation Age of Onset   Colon cancer Neg Hx     Allergies  Allergen Reactions   Tizanidine     Leg cramps    Health Maintenance  Topic Date Due   OPHTHALMOLOGY EXAM  Never done   Zoster Vaccines- Shingrix (1 of 2) Never done   COVID-19 Vaccine (3 - Booster for Moderna series) 06/18/2020   INFLUENZA VACCINE  11/23/2021 (Originally 03/26/2021)   FOOT EXAM  10/09/2021   HEMOGLOBIN A1C  10/23/2021   URINE MICROALBUMIN  04/23/2022   Fecal DNA (Cologuard)  11/03/2023   TETANUS/TDAP  08/14/2024   PNEUMOCOCCAL POLYSACCHARIDE VACCINE AGE 61-64 HIGH RISK  Completed   Hepatitis C Screening  Completed   HIV Screening  Completed   Pneumococcal Vaccine 34-67 Years old  Aged Out   HPV VACCINES  Aged Out    Observations/Objective:   Assessment and Plan: Disseminated MSSA infection  ( 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 The patient was  provided an opportunity to ask questions and all were answered. The patient agreed with the plan and demonstrated an understanding of the instructions.   The patient was advised to call back or seek an in-person evaluation if the symptoms worsen or if the condition fails to improve as anticipated.  I provided 20 minutes of non-face-to-face time during this encounter.  Victoriano Lain, MD [facility] Phone [phone] Fax no. [phone]  05/09/2021, 11:16 AM

## 2021-05-10 ENCOUNTER — Ambulatory Visit: Payer: Commercial Managed Care - PPO | Admitting: Infectious Diseases

## 2021-05-18 ENCOUNTER — Ambulatory Visit: Payer: Commercial Managed Care - PPO | Admitting: Gastroenterology

## 2021-05-18 ENCOUNTER — Other Ambulatory Visit: Payer: Self-pay | Admitting: Family

## 2021-05-18 DIAGNOSIS — M48061 Spinal stenosis, lumbar region without neurogenic claudication: Secondary | ICD-10-CM

## 2021-05-18 DIAGNOSIS — M48062 Spinal stenosis, lumbar region with neurogenic claudication: Secondary | ICD-10-CM

## 2021-05-18 DIAGNOSIS — M5137 Other intervertebral disc degeneration, lumbosacral region: Secondary | ICD-10-CM

## 2021-05-18 NOTE — Telephone Encounter (Signed)
Last office visit 04/23/21 Never filled from our office

## 2021-05-22 ENCOUNTER — Ambulatory Visit: Payer: Commercial Managed Care - PPO | Admitting: Infectious Diseases

## 2021-05-23 ENCOUNTER — Other Ambulatory Visit: Payer: Self-pay

## 2021-05-23 ENCOUNTER — Other Ambulatory Visit: Payer: Commercial Managed Care - PPO

## 2021-05-23 DIAGNOSIS — Z5181 Encounter for therapeutic drug level monitoring: Secondary | ICD-10-CM

## 2021-05-24 ENCOUNTER — Ambulatory Visit: Payer: Commercial Managed Care - PPO | Admitting: Infectious Diseases

## 2021-05-24 LAB — BASIC METABOLIC PANEL
BUN/Creatinine Ratio: 18 (calc) (ref 6–22)
BUN: 10 mg/dL (ref 7–25)
CO2: 30 mmol/L (ref 20–32)
Calcium: 10.2 mg/dL (ref 8.6–10.3)
Chloride: 96 mmol/L — ABNORMAL LOW (ref 98–110)
Creat: 0.57 mg/dL — ABNORMAL LOW (ref 0.70–1.30)
Glucose, Bld: 187 mg/dL — ABNORMAL HIGH (ref 65–99)
Potassium: 5 mmol/L (ref 3.5–5.3)
Sodium: 135 mmol/L (ref 135–146)

## 2021-05-28 ENCOUNTER — Encounter (HOSPITAL_COMMUNITY): Payer: Self-pay | Admitting: Emergency Medicine

## 2021-05-28 ENCOUNTER — Emergency Department (HOSPITAL_COMMUNITY): Payer: Commercial Managed Care - PPO

## 2021-05-28 ENCOUNTER — Inpatient Hospital Stay (HOSPITAL_COMMUNITY)
Admission: EM | Admit: 2021-05-28 | Discharge: 2021-06-11 | DRG: 981 | Disposition: A | Discharge status: Rehabilitation Facility | Payer: Commercial Managed Care - PPO | Attending: Family Medicine | Admitting: Family Medicine

## 2021-05-28 DIAGNOSIS — I330 Acute and subacute infective endocarditis: Secondary | ICD-10-CM | POA: Diagnosis not present

## 2021-05-28 DIAGNOSIS — Z96611 Presence of right artificial shoulder joint: Secondary | ICD-10-CM | POA: Diagnosis present

## 2021-05-28 DIAGNOSIS — E1169 Type 2 diabetes mellitus with other specified complication: Secondary | ICD-10-CM | POA: Diagnosis present

## 2021-05-28 DIAGNOSIS — E119 Type 2 diabetes mellitus without complications: Secondary | ICD-10-CM

## 2021-05-28 DIAGNOSIS — F339 Major depressive disorder, recurrent, unspecified: Secondary | ICD-10-CM

## 2021-05-28 DIAGNOSIS — B9561 Methicillin susceptible Staphylococcus aureus infection as the cause of diseases classified elsewhere: Secondary | ICD-10-CM | POA: Diagnosis present

## 2021-05-28 DIAGNOSIS — Z87891 Personal history of nicotine dependence: Secondary | ICD-10-CM

## 2021-05-28 DIAGNOSIS — Z79899 Other long term (current) drug therapy: Secondary | ICD-10-CM

## 2021-05-28 DIAGNOSIS — Z20822 Contact with and (suspected) exposure to covid-19: Secondary | ICD-10-CM | POA: Diagnosis present

## 2021-05-28 DIAGNOSIS — E1142 Type 2 diabetes mellitus with diabetic polyneuropathy: Secondary | ICD-10-CM | POA: Diagnosis present

## 2021-05-28 DIAGNOSIS — I38 Endocarditis, valve unspecified: Secondary | ICD-10-CM | POA: Diagnosis present

## 2021-05-28 DIAGNOSIS — G8929 Other chronic pain: Secondary | ICD-10-CM | POA: Diagnosis present

## 2021-05-28 DIAGNOSIS — K6812 Psoas muscle abscess: Secondary | ICD-10-CM | POA: Diagnosis present

## 2021-05-28 DIAGNOSIS — M48061 Spinal stenosis, lumbar region without neurogenic claudication: Secondary | ICD-10-CM | POA: Diagnosis present

## 2021-05-28 DIAGNOSIS — A4901 Methicillin susceptible Staphylococcus aureus infection, unspecified site: Secondary | ICD-10-CM

## 2021-05-28 DIAGNOSIS — E1165 Type 2 diabetes mellitus with hyperglycemia: Secondary | ICD-10-CM | POA: Diagnosis present

## 2021-05-28 DIAGNOSIS — M5416 Radiculopathy, lumbar region: Secondary | ICD-10-CM | POA: Diagnosis not present

## 2021-05-28 DIAGNOSIS — F32A Depression, unspecified: Secondary | ICD-10-CM | POA: Diagnosis present

## 2021-05-28 DIAGNOSIS — M4620 Osteomyelitis of vertebra, site unspecified: Secondary | ICD-10-CM | POA: Diagnosis present

## 2021-05-28 DIAGNOSIS — D509 Iron deficiency anemia, unspecified: Secondary | ICD-10-CM | POA: Diagnosis present

## 2021-05-28 DIAGNOSIS — G834 Cauda equina syndrome: Secondary | ICD-10-CM | POA: Diagnosis present

## 2021-05-28 DIAGNOSIS — I10 Essential (primary) hypertension: Secondary | ICD-10-CM | POA: Diagnosis present

## 2021-05-28 DIAGNOSIS — Z888 Allergy status to other drugs, medicaments and biological substances status: Secondary | ICD-10-CM | POA: Diagnosis not present

## 2021-05-28 DIAGNOSIS — Z419 Encounter for procedure for purposes other than remedying health state, unspecified: Secondary | ICD-10-CM

## 2021-05-28 DIAGNOSIS — M4646 Discitis, unspecified, lumbar region: Secondary | ICD-10-CM | POA: Diagnosis present

## 2021-05-28 DIAGNOSIS — M4626 Osteomyelitis of vertebra, lumbar region: Secondary | ICD-10-CM | POA: Diagnosis present

## 2021-05-28 DIAGNOSIS — I959 Hypotension, unspecified: Secondary | ICD-10-CM | POA: Diagnosis not present

## 2021-05-28 LAB — CBC
HCT: 38.7 % — ABNORMAL LOW (ref 39.0–52.0)
Hemoglobin: 12.4 g/dL — ABNORMAL LOW (ref 13.0–17.0)
MCH: 26.6 pg (ref 26.0–34.0)
MCHC: 32 g/dL (ref 30.0–36.0)
MCV: 82.9 fL (ref 80.0–100.0)
Platelets: 411 10*3/uL — ABNORMAL HIGH (ref 150–400)
RBC: 4.67 MIL/uL (ref 4.22–5.81)
RDW: 16.9 % — ABNORMAL HIGH (ref 11.5–15.5)
WBC: 6.8 10*3/uL (ref 4.0–10.5)
nRBC: 0 % (ref 0.0–0.2)

## 2021-05-28 LAB — CBC WITH DIFFERENTIAL/PLATELET
Abs Immature Granulocytes: 0.04 10*3/uL (ref 0.00–0.07)
Basophils Absolute: 0 10*3/uL (ref 0.0–0.1)
Basophils Relative: 0 %
Eosinophils Absolute: 0 10*3/uL (ref 0.0–0.5)
Eosinophils Relative: 0 %
HCT: 39.8 % (ref 39.0–52.0)
Hemoglobin: 12.8 g/dL — ABNORMAL LOW (ref 13.0–17.0)
Immature Granulocytes: 0 %
Lymphocytes Relative: 25 %
Lymphs Abs: 2.6 10*3/uL (ref 0.7–4.0)
MCH: 26.6 pg (ref 26.0–34.0)
MCHC: 32.2 g/dL (ref 30.0–36.0)
MCV: 82.6 fL (ref 80.0–100.0)
Monocytes Absolute: 0.8 10*3/uL (ref 0.1–1.0)
Monocytes Relative: 8 %
Neutro Abs: 6.9 10*3/uL (ref 1.7–7.7)
Neutrophils Relative %: 67 %
Platelets: 426 10*3/uL — ABNORMAL HIGH (ref 150–400)
RBC: 4.82 MIL/uL (ref 4.22–5.81)
RDW: 17 % — ABNORMAL HIGH (ref 11.5–15.5)
WBC: 10.4 10*3/uL (ref 4.0–10.5)
nRBC: 0 % (ref 0.0–0.2)

## 2021-05-28 LAB — RESP PANEL BY RT-PCR (FLU A&B, COVID) ARPGX2
Influenza A by PCR: NEGATIVE
Influenza B by PCR: NEGATIVE
SARS Coronavirus 2 by RT PCR: NEGATIVE

## 2021-05-28 LAB — URINALYSIS, ROUTINE W REFLEX MICROSCOPIC
Bacteria, UA: NONE SEEN
Bilirubin Urine: NEGATIVE
Glucose, UA: NEGATIVE mg/dL
Ketones, ur: NEGATIVE mg/dL
Leukocytes,Ua: NEGATIVE
Nitrite: NEGATIVE
Protein, ur: NEGATIVE mg/dL
Specific Gravity, Urine: 1.002 — ABNORMAL LOW (ref 1.005–1.030)
pH: 8 (ref 5.0–8.0)

## 2021-05-28 LAB — CBG MONITORING, ED: Glucose-Capillary: 275 mg/dL — ABNORMAL HIGH (ref 70–99)

## 2021-05-28 LAB — COMPREHENSIVE METABOLIC PANEL
ALT: 11 U/L (ref 0–44)
AST: 16 U/L (ref 15–41)
Albumin: 3.6 g/dL (ref 3.5–5.0)
Alkaline Phosphatase: 125 U/L (ref 38–126)
Anion gap: 10 (ref 5–15)
BUN: 15 mg/dL (ref 6–20)
CO2: 32 mmol/L (ref 22–32)
Calcium: 10.2 mg/dL (ref 8.9–10.3)
Chloride: 100 mmol/L (ref 98–111)
Creatinine, Ser: 0.67 mg/dL (ref 0.61–1.24)
GFR, Estimated: >60 mL/min (ref >60)
Glucose, Bld: 192 mg/dL — ABNORMAL HIGH (ref 70–99)
Potassium: 4.1 mmol/L (ref 3.5–5.1)
Sodium: 142 mmol/L (ref 135–145)
Total Bilirubin: 0.4 mg/dL (ref 0.3–1.2)
Total Protein: 8.6 g/dL — ABNORMAL HIGH (ref 6.5–8.1)

## 2021-05-28 LAB — HEMOGLOBIN A1C
Hgb A1c MFr Bld: 8.4 % — ABNORMAL HIGH (ref 4.8–5.6)
Mean Plasma Glucose: 194.38 mg/dL

## 2021-05-28 LAB — CREATININE, SERUM
Creatinine, Ser: 0.55 mg/dL — ABNORMAL LOW (ref 0.61–1.24)
GFR, Estimated: >60 mL/min (ref >60)

## 2021-05-28 LAB — LACTIC ACID, PLASMA
Lactic Acid, Venous: 1.4 mmol/L (ref 0.5–1.9)
Lactic Acid, Venous: 1.3 mmol/L (ref 0.5–1.9)

## 2021-05-28 MED ORDER — ACETAMINOPHEN 500 MG PO TABS: 1000.0000 mg | ORAL_TABLET | Freq: Three times a day (TID) | ORAL | Status: DC | PRN

## 2021-05-28 MED ORDER — OXYCODONE-ACETAMINOPHEN 5-325 MG PO TABS
1.0000 | ORAL_TABLET | Freq: Three times a day (TID) | ORAL | Status: DC | PRN
  Administered 2021-05-28 – 2021-06-02 (×14): 1 via ORAL
  Filled 2021-05-28 (×13): qty 1

## 2021-05-28 MED ORDER — OXYCODONE-ACETAMINOPHEN 5-325 MG PO TABS
1.0000 | ORAL_TABLET | Freq: Once | ORAL | Status: AC
  Administered 2021-05-28: 1 via ORAL
  Filled 2021-05-28: qty 1

## 2021-05-28 MED ORDER — OXYCODONE-ACETAMINOPHEN 10-325 MG PO TABS: 1.0000 | ORAL_TABLET | Freq: Three times a day (TID) | ORAL | Status: DC | PRN

## 2021-05-28 MED ORDER — GADOBUTROL 1 MMOL/ML IV SOLN
8.0000 mL | Freq: Once | INTRAVENOUS | Status: AC | PRN
  Administered 2021-05-28: 8 mL via INTRAVENOUS

## 2021-05-28 MED ORDER — GABAPENTIN 400 MG PO CAPS
800.0000 mg | ORAL_CAPSULE | Freq: Every evening | ORAL | Status: DC | PRN
  Administered 2021-05-29 – 2021-06-11 (×8): 800 mg via ORAL
  Filled 2021-05-28: qty 8
  Filled 2021-05-28 (×7): qty 2

## 2021-05-28 MED ORDER — MORPHINE SULFATE ER 15 MG PO TBCR
30.0000 mg | EXTENDED_RELEASE_TABLET | Freq: Two times a day (BID) | ORAL | Status: DC
  Administered 2021-05-28 – 2021-06-10 (×27): 30 mg via ORAL
  Filled 2021-05-28 (×29): qty 2

## 2021-05-28 MED ORDER — ACETAMINOPHEN 500 MG PO TABS
500.0000 mg | ORAL_TABLET | Freq: Four times a day (QID) | ORAL | Status: DC
  Administered 2021-05-28: 500 mg via ORAL
  Filled 2021-05-28: qty 1

## 2021-05-28 MED ORDER — INSULIN ASPART 100 UNIT/ML IJ SOLN
0.0000 [IU] | Freq: Three times a day (TID) | INTRAMUSCULAR | Status: DC
  Administered 2021-05-29: 2 [IU] via SUBCUTANEOUS
  Administered 2021-05-29: 3 [IU] via SUBCUTANEOUS
  Administered 2021-05-30 (×2): 2 [IU] via SUBCUTANEOUS
  Administered 2021-05-30 – 2021-05-31 (×3): 3 [IU] via SUBCUTANEOUS
  Administered 2021-05-31: 1 [IU] via SUBCUTANEOUS
  Administered 2021-06-01: 2 [IU] via SUBCUTANEOUS
  Administered 2021-06-01: 3 [IU] via SUBCUTANEOUS
  Administered 2021-06-01 – 2021-06-02 (×2): 2 [IU] via SUBCUTANEOUS
  Administered 2021-06-02: 3 [IU] via SUBCUTANEOUS
  Administered 2021-06-02 – 2021-06-03 (×2): 2 [IU] via SUBCUTANEOUS
  Administered 2021-06-03: 3 [IU] via SUBCUTANEOUS
  Administered 2021-06-03 – 2021-06-05 (×5): 2 [IU] via SUBCUTANEOUS
  Administered 2021-06-05: 1 [IU] via SUBCUTANEOUS
  Administered 2021-06-06: 2 [IU] via SUBCUTANEOUS
  Administered 2021-06-06: 3 [IU] via SUBCUTANEOUS
  Administered 2021-06-06: 7 [IU] via SUBCUTANEOUS
  Administered 2021-06-07: 2 [IU] via SUBCUTANEOUS
  Administered 2021-06-07 – 2021-06-08 (×2): 3 [IU] via SUBCUTANEOUS
  Administered 2021-06-09: 2 [IU] via SUBCUTANEOUS
  Administered 2021-06-09 – 2021-06-10 (×4): 1 [IU] via SUBCUTANEOUS
  Administered 2021-06-11: 3 [IU] via SUBCUTANEOUS
  Administered 2021-06-11: 1 [IU] via SUBCUTANEOUS
  Filled 2021-05-28: qty 0.09

## 2021-05-28 MED ORDER — HYDROMORPHONE HCL 1 MG/ML IJ SOLN
1.0000 mg | INTRAMUSCULAR | Status: DC | PRN
  Administered 2021-05-29 – 2021-06-02 (×29): 1 mg via INTRAVENOUS
  Filled 2021-05-28 (×32): qty 1

## 2021-05-28 MED ORDER — LORAZEPAM 2 MG/ML IJ SOLN
1.0000 mg | Freq: Once | INTRAMUSCULAR | Status: AC
  Administered 2021-05-28: 1 mg via INTRAVENOUS
  Filled 2021-05-28: qty 1

## 2021-05-28 MED ORDER — CEFAZOLIN SODIUM-DEXTROSE 2-4 GM/100ML-% IV SOLN
2.0000 g | Freq: Three times a day (TID) | INTRAVENOUS | Status: DC
Start: 2021-05-28 — End: 2021-06-08
  Administered 2021-05-28 – 2021-06-08 (×33): 2 g via INTRAVENOUS
  Filled 2021-05-28 (×38): qty 100

## 2021-05-28 MED ORDER — METHOCARBAMOL 500 MG PO TABS
500.0000 mg | ORAL_TABLET | Freq: Two times a day (BID) | ORAL | Status: DC | PRN
  Administered 2021-05-29 – 2021-06-10 (×14): 500 mg via ORAL
  Filled 2021-05-28 (×17): qty 1

## 2021-05-28 MED ORDER — OXYCODONE HCL 5 MG PO TABS
5.0000 mg | ORAL_TABLET | Freq: Three times a day (TID) | ORAL | Status: DC | PRN
  Administered 2021-05-29 – 2021-06-02 (×7): 5 mg via ORAL
  Filled 2021-05-28 (×7): qty 1

## 2021-05-28 NOTE — ED Provider Notes (Signed)
Emergency Medicine Provider Triage Evaluation Note  John Ware , a 55 y.o. male  was evaluated in triage.  Pt complains of back pain and left leg weakness.  Patient reports for the past few weeks he has had progressively worsening weakness in his left leg and since yesterday he has not been able to lift the left leg.  He also reports some decreased sensation.  He reports he had a significant history of infection in the left shoulder and had to have multiple surgeries and is now worried he could have an infection affecting his leg.  He reports this weakness is associated with new and worsening pain in his low back, and that he has had low-grade fevers and chills.  No history of IV drug use.  Does have a prior history of back surgery.  Review of Systems  Positive: Back pain, leg weakness, leg numbness, chills, Negative: Headache, vision change, abdominal pain, loss of bowel or bladder control  Physical Exam  BP (!) 138/100 (BP Location: Right Arm)   Pulse 88   Temp 98.4 F (36.9 C) (Oral)   Resp 16   SpO2 97%  Gen:   Awake, no distress   Resp:  Normal effort  MSK:   Moves extremities without difficulty, left leg without erythema or swelling, tenderness over the lumbar spine Other:  Weakness of the left leg, unable to lift it on his own as well as decreased sensation  Medical Decision Making  Medically screening exam initiated at 1:29 PM.  Appropriate orders placed.  Fritz L Mazo was informed that the remainder of the evaluation will be completed by another provider, this initial triage assessment does not replace that evaluation, and the importance of remaining in the ED until their evaluation is complete.  Concern for potential cauda equina syndrome versus epidural abscess.  MRI of the lumbar spine and blood work ordered.   Dartha Lodge, PA-C 05/28/21 1338    Lorre Nick, MD 05/30/21 [phone]

## 2021-05-28 NOTE — ED Notes (Signed)
Neurosurg page to Dr Freida Busman

## 2021-05-28 NOTE — ED Provider Notes (Signed)
MR RI with worsening evidence of infection and phlegmon in the epidural space.  This is most likely the cause of patient's new left leg pain and weakness.  Spoke with Dr. Lovell Sheehan with neurosurgery who recommended that patient be transferred over to [facility].  Spoke with Dr. Ilsa Iha with infectious disease who recommends 2 g of Ancef every 8 hours and ID will see him in the morning.  Will admit to the hospitalist service.  Findings discussed with the patient and he is comfortable with this plan.   Gwyneth Sprout, MD 05/28/21 [phone]

## 2021-05-28 NOTE — ED Notes (Signed)
Pt transported to MRI 

## 2021-05-28 NOTE — H&P (Addendum)
History and Physical    John Ware:[id number] DOB: 1966-08-12 DOA: 05/28/2021  PCP: Junie Spencer, FNP   Patient coming from: Home   Chief Complaint: back pain and left leg weakness.   HPI: John Ware is a 55 y.o. 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 He had bilateral shoulder debridement, and drain placement.  He was discharged with IV antibiotic therapy.  PICC line cefepime and then transition to oral cephalexin. He did follow-up outpatient with infectious disease.  For the last 5 days patient has been experiencing recurrent lower back pain that was persistent, dull in nature, worse with movement and over the last 3 days associated with left lower extremity weakness.  No improving factors.  Intermittent fevers. Because of worsening symptoms he presented to the emergency room for further evaluation.  At home he has been walking with a help of a cane but over the last 3 days had significant difficulty ambulating.   ED Course: Patient underwent a lumbar spine MRI showing ongoing discitis/osteomyelitis at L1-L2 and L2-L3 with progressive endplate irregularity and disc space narrowing.  New/increased ventral epidural phlegmon along the posterior endplates at L1, resulting in severe spinal canal stenosis with compression of the cauda equina nerve roots.  Neurosurgery was consulted, Dr. Lovell Sheehan recommended transfer to [facility] for further evaluation. Infectious disease was contacted Dr. Ilsa Iha who recommend continue antibiotic therapy with high-dose cefazolin.   Review of Systems:  1. General: intermittent subjective fevers, no chills, no weight gain or weight loss 2. ENT: No runny nose or sore throat, no hearing disturbances 3. Pulmonary: No dyspnea, cough, wheezing, or  hemoptysis 4. Cardiovascular: No angina, claudication, lower extremity edema, pnd or orthopnea 5. Gastrointestinal: No nausea or vomiting, no diarrhea or constipation 6. Hematology: No easy bruisability or frequent infections 7. Urology: No dysuria, hematuria or increased urinary frequency 8. Dermatology: No rashes. 9. Neurology: No seizures or paresthesias, left leg weakness.  10. Musculoskeletal: positive back pain as mentioned in HPI.   Past Medical History:  Diagnosis Date   Depression    Diabetes mellitus without complication (HCC)    Hypertension    Spinal stenosis    With Neurogenic Claudication    Past Surgical History:  Procedure Laterality Date   CARPAL TUNNEL RELEASE Bilateral    INCISION AND DRAINAGE ABSCESS Left 03/12/2021   Procedure: INCISION AND DRAINAGE ABSCESS;  Surgeon: [name], MD;  Location: [facility];  Service: Orthopedics;  Laterality: Left;   IR US GUIDE BX ASP/DRAIN  02/02/2021   IRRIGATION AND DEBRIDEMENT ABSCESS Left 02/08/2021   Procedure: MINOR INCISION AND DRAINAGE OF ABSCESS;  Surgeon: [name], MD;  Location: [facility];  Service: Orthopedics;  Laterality: Left;   IRRIGATION AND DEBRIDEMENT ABSCESS Left 02/10/2021   Procedure: IRRIGATION AND DEBRIDEMENT ABSCESS LEFT LATERAL CHEST WALL;  Surgeon: [name], MD;  Location: [facility];  Service: General;  Laterality: Left;   IRRIGATION AND DEBRIDEMENT ABSCESS Bilateral 02/20/2021   Procedure: MINOR INCISION AND DRAINAGE OF ABSCESS/WITH BILATERAL WOUND VAC PLACEMENT;  Surgeon: [name], MD;  Location: [facility];  Service: Orthopedics;  Laterality: Bilateral;   IRRIGATION AND DEBRIDEMENT SHOULDER Bilateral 02/28/2021   Procedure: IRRIGATION AND DEBRIDEMENT SHOULDER;  Surgeon: [name], MD;  Location: [facility];  Service: Orthopedics;  Laterality: Bilateral;   IRRIGATION AND DEBRIDEMENT SHOULDER Left 03/06/2021   Procedure: IRRIGATION  AND DEBRIDEMENT SHOULDER;  Surgeon: [name], MD;  Location: [facility];  Service: Orthopedics;  Laterality: Left;   IRRIGATION AND DEBRIDEMENT SHOULDER Left 03/08/2021   Procedure: IRRIGATION AND DEBRIDEMENT SHOULDER;  Surgeon: [name], MD;  Location: [facility];  Service: Orthopedics;  Laterality: Left;   LUMBAR LAMINECTOMY/DECOMPRESSION MICRODISCECTOMY N/A 12/13/2014   Procedure: LUMBAR THREE-FOUR, LUMBAR FOUR-FIVE, LUMBAR FIVE-SACRAL ONE LAMINECTOMY;  Surgeon: [name], MD;  Location: [facility];  Service: Neurosurgery;  Laterality: N/A;  L3-4 L4-5 L5-S1 Laminectomy   MENISCUS REPAIR     Right knee   MINOR APPLICATION OF WOUND VAC Left 02/20/2021   Procedure: MINOR APPLICATION OF KERLIX PACKING/REMOVAL OF WOUND VAC;  Surgeon: [name], MD;  Location: [facility];  Service: General;  Laterality: Left;   TEE WITHOUT CARDIOVERSION N/A 02/05/2021   Procedure: TRANSESOPHAGEAL ECHOCARDIOGRAM (TEE);  Surgeon: Quintella Reichert, MD;  Location: [facility] ENDOSCOPY;  Service: Cardiovascular;  Laterality: N/A;   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 His smoking use included cigarettes. He has a 30.00 pack-year smoking history. He has never used smokeless tobacco. He reports current alcohol use. He reports that he does not use drugs.  Allergies  Allergen Reactions   Tizanidine Other (See Comments)    Leg cramps    Family History  Problem Relation Age of Onset   Colon cancer Neg Hx      Prior to Admission medications   Medication Sig Start Date End Date Taking? Authorizing Provider  acetaminophen (TYLENOL) 500 MG tablet Take 1 tablet (500 mg total) by mouth 4 (four) times daily. Patient taking differently: Take 1,000 mg by mouth 3 (three) times daily as needed (pain).  03/15/21  Yes Rodolph Bong, MD  cephALEXin (KEFLEX) 500 MG capsule Take 1 capsule (500 mg total) by mouth 3 (three) times daily. 04/09/21  Yes Odette Fraction, MD  gabapentin (NEURONTIN) 800 MG tablet TAKE (1) TABLET THREE TIMES DAILY. Patient taking differently: Take 800 mg by mouth at bedtime as needed (nerve pain/foot pain). 05/18/21  Yes Hawks, Christy A, FNP  magic mouthwash SOLN Take 5 mLs by mouth 4 (four) times daily. Patient taking differently: Take 5 mLs by mouth daily as needed for mouth pain. Swish and swallow 03/23/21  Yes Hawks, Christy A, FNP  metFORMIN (GLUCOPHAGE-XR) 750 MG 24 hr tablet Take 1,500 mg by mouth daily with breakfast. 06/23/20  Yes [provider]  methocarbamol (ROBAXIN) 500 MG tablet Take 1 tablet (500 mg total) by mouth 2 (two) times daily as needed for muscle spasms. 04/23/21  Yes Hawks, Christy A, FNP  morphine (MS CONTIN) 30 MG 12 hr tablet Take 30 mg by mouth 2 (two) times daily. 05/11/21  Yes [provider]  naproxen sodium (ALEVE) 220 MG tablet Take 440 mg by mouth daily as needed (pain).   Yes [provider]  oxyCODONE-acetaminophen (PERCOCET) 10-325 MG tablet Take 1 tablet by mouth 3 (three) times daily as needed (breakthrough pain). 05/11/21  Yes [provider]  ascorbic acid (VITAMIN C) 500 MG tablet Take 1 tablet (500 mg total) by mouth 2 (two) times daily. Patient not taking: No sig reported 03/15/21  Rodolph Bong, MD  buPROPion (WELLBUTRIN XL) 150 MG 24 hr tablet Take 1 tablet (150 mg total) by mouth 2 (two) times daily. Patient not taking: No sig reported 03/15/21   Rodolph Bong, MD  Continuous Blood Gluc Sensor (FREESTYLE LIBRE 2 SENSOR) MISC Needs to check BS QID 03/20/21   Junie Spencer, FNP  empagliflozin (JARDIANCE) 10 MG TABS tablet Take 1 tablet (10 mg total) by mouth daily before breakfast. Patient not taking: No sig reported 04/24/21   Jannifer Rodney A, FNP  ferrous sulfate 325 (65 FE) MG  tablet Take 1 tablet (325 mg total) by mouth 2 (two) times daily with a meal. Patient not taking: No sig reported 04/23/21   Junie Spencer, FNP    Physical Exam: Vitals:   05/28/21 1645 05/28/21 1715 05/28/21 1745 05/28/21 1800  BP: (!) 132/99 (!) 155/98 (!) 158/104 (!) 154/100  Pulse: 75 85 76 77  Resp: 16 16 14 15   Temp:      TempSrc:      SpO2: 100% 97% 100% 99%    Vitals:   05/28/21 1645 05/28/21 1715 05/28/21 1745 05/28/21 1800  BP: (!) 132/99 (!) 155/98 (!) 158/104 (!) 154/100  Pulse: 75 85 76 77  Resp: 16 16 14 15   Temp:      TempSrc:      SpO2: 100% 97% 100% 99%   General: Not in pain or dyspnea  Neurology: Awake and alert, left leg with 3/5 strength,mainly proximal.  Head and Neck. Head normocephalic. Neck supple with no adenopathy or thyromegaly.   E ENT: mild pallor, no icterus, oral mucosa moist Cardiovascular: No JVD. S1-S2 present, rhythmic, no gallops, rubs, or murmurs. No lower extremity edema. Pulmonary: vesicular breath sounds bilaterally, adequate air movement, no wheezing, rhonchi or rales. Gastrointestinal. Abdomen soft and non tender Skin. No rashes Musculoskeletal: surgical scars bilateral shoulders.    Labs on Admission: I have personally reviewed following labs and imaging studies  CBC: Recent Labs  Lab 05/28/21 1353  WBC 10.4  NEUTROABS 6.9  HGB 12.8*  HCT 39.8  MCV 82.6  PLT 426*   Basic Metabolic Panel: Recent Labs  Lab 05/23/21 1121 05/28/21 1353  NA 135 142  K 5.0 4.1  CL 96* 100  CO2 30 32  GLUCOSE 187* 192*  BUN 10 15  CREATININE 0.57* 0.67  CALCIUM 10.2 10.2   GFR: CrCl cannot be calculated (Unknown ideal weight.). Liver Function Tests: Recent Labs  Lab 05/28/21 1353  AST 16  ALT 11  ALKPHOS 125  BILITOT 0.4  PROT 8.6*  ALBUMIN 3.6   No results for input(s): LIPASE, AMYLASE in the last 168 hours. No results for input(s): AMMONIA in the last 168 hours. Coagulation Profile: No results for input(s): INR,  PROTIME in the last 168 hours. Cardiac Enzymes: No results for input(s): CKTOTAL, CKMB, CKMBINDEX, TROPONINI in the last 168 hours. BNP (last 3 results) No results for input(s): PROBNP in the last 8760 hours. HbA1C: No results for input(s): HGBA1C in the last 72 hours. CBG: No results for input(s): GLUCAP in the last 168 hours. Lipid Profile: No results for input(s): CHOL, HDL, LDLCALC, TRIG, CHOLHDL, LDLDIRECT in the last 72 hours. Thyroid Function Tests: No results for input(s): TSH, T4TOTAL, FREET4, T3FREE, THYROIDAB in the last 72 hours. Anemia Panel: No results for input(s): VITAMINB12, FOLATE, FERRITIN, TIBC, IRON, RETICCTPCT in the last 72 hours. Urine analysis:    Component Value Date/Time   COLORURINE COLORLESS (A) 05/28/2021 1353  APPEARANCEUR CLEAR 05/28/2021 1353   LABSPEC 1.002 (L) 05/28/2021 1353   PHURINE 8.0 05/28/2021 1353   GLUCOSEU NEGATIVE 05/28/2021 1353   HGBUR MODERATE (A) 05/28/2021 1353   BILIRUBINUR NEGATIVE 05/28/2021 1353   KETONESUR NEGATIVE 05/28/2021 1353   PROTEINUR NEGATIVE 05/28/2021 1353   NITRITE NEGATIVE 05/28/2021 1353   LEUKOCYTESUR NEGATIVE 05/28/2021 1353    Radiological Exams on Admission: John Lumbar Spine W Wo Contrast  Result Date: 05/28/2021 CLINICAL DATA:  Back pain and left leg weakness, progressively worsening weakness, decreased sensation EXAM: MRI LUMBAR SPINE WITHOUT AND WITH CONTRAST TECHNIQUE: Multiplanar and multiecho pulse sequences of the lumbar spine were obtained without and with intravenous contrast. CONTRAST:  61mL GADAVIST GADOBUTROL 1 MMOL/ML IV SOLN COMPARISON:  Lumbar spine MRI 02/15/2021 FINDINGS: Segmentation: Standard; the lowest formed disc space is designated L5-S1 Alignment: Straightening of the normal lumbar spine lordosis. There is 6 mm anterolisthesis of L5 on S1, unchanged. Vertebrae: The patient is status post L3 through S1 TLIF. Hardware alignment is grossly within expected limits. There is abnormal T1  hypointensity and STIR hyperintensity in the L1 and L2 vertebral bodies and L1-L2 and L2-L3 disc spaces. Compared to the study from 02/15/2021, endplate irregularity and disc space narrowing has worsened. There is enhancement along the posterior endplate of the L1 and L2 vertebral bodies measuring up to 5 mm in thickness at L1 (16-10), new/increased since 02/15/2021. To a lesser degree there is enhancement along the dorsal lateral aspects of the canal (17-28). There is no definite evidence of central non enhancement to suggest epidural abscess. Conus medullaris and cauda equina: Conus extends to the mid L1 level. There is no definite abnormal enhancement of the cauda equina nerve roots. Paraspinal and other soft tissues: There is abnormal T2 hyperintensity throughout the paraspinal musculature and bilateral psoas muscles. There is a 0.8 cm by 0.8 cm collection within the right psoas muscle and an irregular collection in the left psoas muscle measuring 1.2 cm x 0.9 cm concerning for residual abscesses, though overall the degree of intramuscular abscesses in the psoas muscles has markedly improved. There is ill-defined abnormal enhancement along the ventral vertebral bodies centered at L1-L2. There is no evidence of prevertebral abscess. There is a fluid collection in the posterior soft tissues centered at the L4-L5 disc space measuring 5.9 cm cc by 2.4 cm AP, grossly similar in size and appearance. Disc levels: T12-L1: There is a mild disc bulge eccentric to the right, ligamentum flavum thickening, and bilateral facet arthropathy resulting in crowding of the right subarticular zone with no high-grade spinal canal or neural foraminal stenosis. L1-L2: Above described endplate irregularity and ventral epidural phlegmon results in severe spinal canal stenosis with compression of the cauda equina nerve roots. Phlegmon extends into the bilateral neural foramina with likely irritation of the exiting nerve roots. L2-L3: This  level is suboptimally assessed due to artifact from adjacent hardware. There is bulky ligamentum flavum thickening and bilateral facet arthropathy resulting in moderate spinal canal stenosis and moderate bilateral neural foraminal stenosis L3-L4: Postsurgical changes reflecting prior TLIF are again seen. There is no residual significant spinal canal or neural foraminal stenosis. L4-L5: Postsurgical changes reflecting prior T liver again seen. There is no residual high-grade spinal canal or neural foraminal stenosis. L5-S1: There is grade 1 anterolisthesis of L5 on S1. Postsurgical changes reflecting prior T liver again seen. There is no residual high-grade spinal canal stenosis. There is moderate bilateral neural foraminal stenosis primarily due to the anterolisthesis. IMPRESSION: 1. Findings above  consistent with ongoing discitis/osteomyelitis at L1-L2 and L2-L3 with progressed endplate irregularity and disc space narrowing. There is new/increased ventral epidural phlegmon along the posterior endplates centered at L1 measuring up to 5 mm in thickness resulting in severe spinal canal stenosis with compression of the cauda equina nerve roots. No evidence of organized intra spinal epidural abscess. 2. Phlegmon is also seen along the ventral aspect of the L1 and L2 vertebral bodies without evidence of paravertebral abscess. 3. Overall interval decrease in the size and number of bilateral psoas abscesses with small residual abscesses bilaterally as above. Diffuse signal abnormality throughout the remainder of the psoas and paraspinal musculature consistent with infectious/inflammatory myositis. 4. Stable postsurgical changes reflecting L3 through S1 TLIF. Multilevel degenerative changes throughout the lumbar spine described in detail above. 5. No significant interval change in size or appearance of the fluid collection in the posterior soft tissues centered at the L4-L5 disc space favored to reflect a postoperative  seroma, though infection can not be excluded by imaging. Electronically Signed   By: Lesia Hausen M.D.   On: 05/28/2021 16:15    EKG: NA  Assessment/Plan Principal Problem:   Infection of spine (HCC) Active Problems:   Depression, recurrent (HCC)   Diabetes mellitus (HCC)   Diabetic polyneuropathy associated with type 2 diabetes mellitus (HCC)   HTN (hypertension)   Endocarditis   Psoas abscess (HCC)  55 year old male with a recent mitral valve endocarditis, due to MSSA.  He was successfully treated with antibiotic therapy, intravenous and then transition to oral.  Now presents with worsening back pain and focal neurologic deficit on his left lower extremity.  His vital signs are stable with a blood pressure 155/98, heart rate 85, respiratory rate 16, oxygen saturation 97%. His lungs are clear to auscultation bilaterally, heart S1-S2, present, rhythmic, soft abdomen, no lower extremity edema, he does have decreased strength proximal left lower extremity 3 out of 5.  Sodium 142, potassium 4.1, chloride 100, bicarb 32, glucose 192, BUN 15, creatinine 0.67, lactic acid 1.4, white count 10.4, hemoglobin 12.8, hematocrit 39.8, platelets 426. SARS COVID-19 negative.  Urinalysis specific gravity 1.002, negative nitrates.  Lumbar spine MRI with ongoing discitis/osteomyelitis at L1-L2 and L2-L3.  Epidural phlegmon along the posterior endplate centered at L1 with severe spinal canal stenosis with compression of cauda equina nerve roots.  Decreased size and number of bilateral psoas abscesses with small residual abscess bilaterally.  Diffuse signal abnormality throughout the psoas and paraspinal musculature consistent with inflammatory myositis.  John Ware will be admitted to the hospital with working diagnosis of lumbar spine osteomyelitis with compression of cauda equina nerve roots, focal left lower extremity weakness.  L1-L2 and L2-L3 osteomyelitis, complicated with compression of cauda equina  nerve roots. Patient is being transferred to [facility] for further neurosurgical evaluation per Dr. Lovell Sheehan. Dr. Ilsa Iha has recommended continue antibiotic therapy with cefazolin 2 g every 8 hours.  Continue close follow-up of cell count, cultures and temperature curve. Patient has high risk of developing sepsis. Pain control with morphine, oxycodone and hydromorphone   2.  Type 2 diabetes mellitus.  Hold metformin for now, add insulin sliding scale for glucose coverage and monitoring. Hold empagliflozin.  3.  Diabetic neuropathy.  Continue gabapentin and methocarbamol per his home regimen.  4.  Iron deficiency anemia, patient not taking ferrous sulfate. Follow on cell count.   5. HTN. Continue to hold on antihypertensive medications,.   Status is: Inpatient  Remains inpatient appropriate because:Inpatient level of care appropriate  due to severity of illness  Dispo: The patient is from: Home              Anticipated d/c is to: Home              Patient currently is not medically stable to d/c.   Difficult to place patient No    DVT prophylaxis: Scd   Code Status:    full  Family Communication:  No family at the bedside     Consults called:   ID and neurosurgery   Admission status:  Inpatient.    Oria Klimas Annett Gula MD [facility]   05/28/2021, 6:41 PM

## 2021-05-28 NOTE — ED Triage Notes (Addendum)
Per pt, states his left leg is numb, progressively getting worse over 2 weeks-can no longer move leg-has rods in his back-states he was recently discharged for an infection and wants to make sure he does not have it in his leg

## 2021-05-28 NOTE — ED Provider Notes (Addendum)
[facility]-EMERGENCY DEPT Provider Note   CSN: [id number] Arrival date & time: 05/28/21  1201     History Chief Complaint  Patient presents with   Leg Pain    John Ware is a 55 y.o. male.  55 year old male presents with 3 days of left lower extremity weakness.  Patient states he started to have pain in his lower lumbar spine which did not radiate down to his left leg.  Notes he cannot walk at this time.  Denies any bowel or bladder dysfunction.  Does have a history of recent chest wall abscess.  States he had a low-grade fever without urinary symptoms.  No emesis.  Does use a cane normally to walk      Past Medical History:  Diagnosis Date   Depression    Diabetes mellitus without complication (HCC)    Hypertension    Spinal stenosis    With Neurogenic Claudication    Patient Active Problem List   Diagnosis Date Noted   Medication monitoring encounter 05/09/2021   MSSA (methicillin susceptible Staphylococcus aureus) infection 05/09/2021   Abscess    History of COVID-19    Psoas abscess (HCC)    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   Right shoulder pain 02/14/2021   Chest wall abscess 02/14/2021   Obesity 02/14/2021   Normocytic anemia 02/14/2021   Thrombocytosis 02/14/2021   Endocarditis 02/05/2021   Vision changes 02/04/2021   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   Pressure injury of skin 02/01/2021   HTN (hypertension) 02/01/2021   Anxiety 02/01/2021   Left shoulder pain 02/01/2021   Hyponatremia 01/31/2021   Positive colorectal cancer screening using Cologuard test 12/28/2020   DOE (dyspnea on exertion) 10/20/2020   Diabetic polyneuropathy associated with type 2 diabetes mellitus (HCC) 10/09/2020   Diabetes mellitus (HCC) 10/30/2018   Lumbar stenosis with neurogenic claudication 10/14/2017   DDD (degenerative disc disease), lumbar 02/17/2017   Left leg weakness 02/05/2017   Hyperlipidemia associated with type 2 diabetes  mellitus (HCC) 10/24/2016   Depression, recurrent (HCC) 10/24/2016   Right knee pain 05/21/2016   DDD (degenerative disc disease), lumbosacral 05/21/2016   Overweight (BMI 25.0-29.9) 05/21/2016   Lumbar stenosis 12/13/2014    Past Surgical History:  Procedure Laterality Date   CARPAL TUNNEL RELEASE Bilateral    INCISION AND DRAINAGE ABSCESS Left 03/12/2021   Procedure: INCISION AND DRAINAGE ABSCESS;  Surgeon: [name], MD;  Location: [facility];  Service: Orthopedics;  Laterality: Left;   IR US GUIDE BX ASP/DRAIN  02/02/2021   IRRIGATION AND DEBRIDEMENT ABSCESS Left 02/08/2021   Procedure: MINOR INCISION AND DRAINAGE OF ABSCESS;  Surgeon: [name], MD;  Location: [facility];  Service: Orthopedics;  Laterality: Left;   IRRIGATION AND DEBRIDEMENT ABSCESS Left 02/10/2021   Procedure: IRRIGATION AND DEBRIDEMENT ABSCESS LEFT LATERAL CHEST WALL;  Surgeon: [name], MD;  Location: [facility];  Service: General;  Laterality: Left;   IRRIGATION AND DEBRIDEMENT ABSCESS Bilateral 02/20/2021   Procedure: MINOR INCISION AND DRAINAGE OF ABSCESS/WITH BILATERAL WOUND VAC PLACEMENT;  Surgeon: [name], MD;  Location: [facility];  Service: Orthopedics;  Laterality: Bilateral;   IRRIGATION AND DEBRIDEMENT SHOULDER Bilateral 02/28/2021   Procedure: IRRIGATION AND DEBRIDEMENT SHOULDER;  Surgeon: [name], MD;  Location: [facility];  Service: Orthopedics;  Laterality: Bilateral;   IRRIGATION AND DEBRIDEMENT SHOULDER Left 03/06/2021   Procedure: IRRIGATION AND DEBRIDEMENT SHOULDER;  Surgeon: [name], MD;  Location: [facility];  Service: Orthopedics;  Laterality: Left;  IRRIGATION AND DEBRIDEMENT SHOULDER Left 03/08/2021   Procedure: IRRIGATION AND DEBRIDEMENT SHOULDER;  Surgeon: [name], MD;  Location: [facility];  Service: Orthopedics;  Laterality: Left;   LUMBAR LAMINECTOMY/DECOMPRESSION MICRODISCECTOMY N/A 12/13/2014   Procedure: LUMBAR THREE-FOUR, LUMBAR FOUR-FIVE, LUMBAR FIVE-SACRAL ONE LAMINECTOMY;   Surgeon: [name], MD;  Location: [facility];  Service: Neurosurgery;  Laterality: N/A;  L3-4 L4-5 L5-S1 Laminectomy   MENISCUS REPAIR     Right knee   MINOR APPLICATION OF WOUND VAC Left 02/20/2021   Procedure: MINOR APPLICATION OF KERLIX PACKING/REMOVAL OF WOUND VAC;  Surgeon: [name], MD;  Location: [facility];  Service: General;  Laterality: Left;   TEE WITHOUT CARDIOVERSION N/A 02/05/2021   Procedure: TRANSESOPHAGEAL ECHOCARDIOGRAM (TEE);  Surgeon: Quintella Reichert, MD;  Location: [facility];  Service: Cardiovascular;  Laterality: N/A;   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   TOTAL SHOULDER ARTHROPLASTY Right 02/10/2021   Procedure: RIGHT OPEN SHOULDER I & D, OPEN DISTAL CLAVICLE RESECTION;  Surgeon: [name], MD;  Location: [facility];  Service: Orthopedics;  Laterality: Right;   VASECTOMY         Family History  Problem Relation Age of Onset   Colon cancer Neg Hx     Social History   Tobacco Use   Smoking status: Former    Packs/day: 2.00    Years: 15.00    Pack years: 30.00    Types: Cigarettes    Quit date: 2012    Years since quitting: 10.7   Smokeless tobacco: Never  Vaping Use   Vaping Use: Never used  Substance Use Topics   Alcohol use: Yes    Comment: occasional   Drug use: No    Home Medications Prior to Admission medications   Medication Sig Start Date End Date Taking? Authorizing Provider  acetaminophen (TYLENOL) 500 MG tablet Take 1 tablet (500 mg total) by mouth 4 (four) times daily. 03/15/21   Rodolph Bong, MD  amLODipine (NORVASC) 5 MG tablet Take 1 tablet (5 mg total) by mouth daily. 06/23/20   Jannifer Rodney A, FNP  ascorbic acid (VITAMIN C) 500 MG tablet Take 1 tablet (500 mg total) by mouth 2 (two) times daily. 03/15/21   Rodolph Bong, MD  buPROPion (WELLBUTRIN XL) 150 MG 24 hr tablet Take 1 tablet (150 mg total) by mouth 2 (two) times daily. 03/15/21   Rodolph Bong, MD  cephALEXin (KEFLEX) 500 MG capsule  Take 1 capsule (500 mg total) by mouth 3 (three) times daily. 04/09/21   Odette Fraction, MD  Continuous Blood Gluc Sensor (FREESTYLE LIBRE 2 SENSOR) MISC Needs to check BS QID 03/20/21   Jannifer Rodney A, FNP  empagliflozin (JARDIANCE) 10 MG TABS tablet Take 1 tablet (10 mg total) by mouth daily before breakfast. 04/24/21   Junie Spencer, FNP  ferrous sulfate 325 (65 FE) MG tablet Take 1 tablet (325 mg total) by mouth 2 (two) times daily with a meal. 04/23/21   Hawks, Christy A, FNP  gabapentin (NEURONTIN) 800 MG tablet TAKE (1) TABLET THREE TIMES DAILY. 05/18/21   Junie Spencer, FNP  magic mouthwash SOLN Take 5 mLs by mouth 4 (four) times daily. 03/23/21   Junie Spencer, FNP  metFORMIN (GLUCOPHAGE XR) 750 MG 24 hr tablet Take 2 tablets (1,500 mg total) by mouth daily with breakfast. 06/23/20 12/20/20  Jannifer Rodney A, FNP  methocarbamol (ROBAXIN) 500 MG tablet Take 1 tablet (500 mg total) by  mouth 2 (two) times daily as needed for muscle spasms. 04/23/21   Jannifer Rodney A, FNP  Oxycodone HCl 20 MG TABS Take 20 mg by mouth 4 (four) times daily as needed.    [provider]  Vitamin D, Ergocalciferol, (DRISDOL) 1.25 MG (50000 UNIT) CAPS capsule Take 50,000 Units by mouth once a week. 01/25/21   [provider]    Allergies    Tizanidine  Review of Systems   Review of Systems  All other systems reviewed and are negative.  Physical Exam Updated Vital Signs BP (!) 134/100   Pulse 84   Temp 98.4 F (36.9 C) (Oral)   Resp 19   SpO2 98%   Physical Exam Vitals and nursing note reviewed.  Constitutional:      General: He is not in acute distress.    Appearance: Normal appearance. He is well-developed. He is not toxic-appearing.  HENT:     Head: Normocephalic and atraumatic.  Eyes:     General: Lids are normal.     Conjunctiva/sclera: Conjunctivae normal.     Pupils: Pupils are equal, round, and reactive to light.  Neck:     Thyroid: No thyroid mass.     Trachea:  No tracheal deviation.  Cardiovascular:     Rate and Rhythm: Normal rate and regular rhythm.     Heart sounds: Normal heart sounds. No murmur heard.   No gallop.  Pulmonary:     Effort: Pulmonary effort is normal. No respiratory distress.     Breath sounds: Normal breath sounds. No stridor. No decreased breath sounds, wheezing, rhonchi or rales.  Abdominal:     General: There is no distension.     Palpations: Abdomen is soft.     Tenderness: There is no abdominal tenderness. There is no rebound.  Musculoskeletal:        General: No tenderness. Normal range of motion.     Cervical back: Normal range of motion and neck supple.  Skin:    General: Skin is warm and dry.     Findings: No abrasion or rash.  Neurological:     Mental Status: He is alert and oriented to person, place, and time. Mental status is at baseline.     GCS: GCS eye subscore is 4. GCS verbal subscore is 5. GCS motor subscore is 6.     Cranial Nerves: Cranial nerves are intact. No cranial nerve deficit.     Sensory: No sensory deficit.     Motor: Motor function is intact.     Comments: Strength is 1 of 5 in left lower extremity.  Has normal dorsal and plantar flexion with no hip movement.  Patellar reflexes absent bilaterally.  Psychiatric:        Attention and Perception: Attention normal.        Speech: Speech normal.        Behavior: Behavior normal.    ED Results / Procedures / Treatments   Labs (all labs ordered are listed, but only abnormal results are displayed) Labs Reviewed  CBC WITH DIFFERENTIAL/PLATELET - Abnormal; Notable for the following components:      Result Value   Hemoglobin 12.8 (*)    RDW 17.0 (*)    Platelets 426 (*)    All other components within normal limits  COMPREHENSIVE METABOLIC PANEL  URINALYSIS, ROUTINE W REFLEX MICROSCOPIC  LACTIC ACID, PLASMA  LACTIC ACID, PLASMA    EKG None  Radiology No results found.  Procedures Procedures   Medications  Ordered in  ED Medications  LORazepam (ATIVAN) injection 1 mg (1 mg Intravenous Given 05/28/21 1418)    ED Course  I have reviewed the triage vital signs and the nursing notes.  Pertinent labs & imaging results that were available during my care of the patient were reviewed by me and considered in my medical decision making (see chart for details).    MDM Rules/Calculators/A&P                           Patient's MRI results noted and show evidence of increasing phlegmon with compression of cauda equina nerve roots.  Will consult neurosurgery Final Clinical Impression(s) / ED Diagnoses Final diagnoses:  None    Rx / DC Orders ED Discharge Orders     None        Lorre Nick, MD 05/28/21 1651    Lorre Nick, MD 05/28/21 1700

## 2021-05-29 ENCOUNTER — Other Ambulatory Visit: Payer: Self-pay

## 2021-05-29 DIAGNOSIS — F339 Major depressive disorder, recurrent, unspecified: Secondary | ICD-10-CM | POA: Diagnosis not present

## 2021-05-29 DIAGNOSIS — M4620 Osteomyelitis of vertebra, site unspecified: Secondary | ICD-10-CM

## 2021-05-29 DIAGNOSIS — I330 Acute and subacute infective endocarditis: Secondary | ICD-10-CM

## 2021-05-29 DIAGNOSIS — E1142 Type 2 diabetes mellitus with diabetic polyneuropathy: Secondary | ICD-10-CM

## 2021-05-29 LAB — BASIC METABOLIC PANEL
Anion gap: 7 (ref 5–15)
BUN: 12 mg/dL (ref 6–20)
CO2: 30 mmol/L (ref 22–32)
Calcium: 9.4 mg/dL (ref 8.9–10.3)
Chloride: 99 mmol/L (ref 98–111)
Creatinine, Ser: 0.59 mg/dL — ABNORMAL LOW (ref 0.61–1.24)
GFR, Estimated: >60 mL/min (ref >60)
Glucose, Bld: 211 mg/dL — ABNORMAL HIGH (ref 70–99)
Potassium: 4.2 mmol/L (ref 3.5–5.1)
Sodium: 136 mmol/L (ref 135–145)

## 2021-05-29 LAB — CBC
HCT: 37.5 % — ABNORMAL LOW (ref 39.0–52.0)
Hemoglobin: 12 g/dL — ABNORMAL LOW (ref 13.0–17.0)
MCH: 26.5 pg (ref 26.0–34.0)
MCHC: 32 g/dL (ref 30.0–36.0)
MCV: 83 fL (ref 80.0–100.0)
Platelets: 347 10*3/uL (ref 150–400)
RBC: 4.52 MIL/uL (ref 4.22–5.81)
RDW: 16.9 % — ABNORMAL HIGH (ref 11.5–15.5)
WBC: 6.9 10*3/uL (ref 4.0–10.5)
nRBC: 0 % (ref 0.0–0.2)

## 2021-05-29 LAB — CBG MONITORING, ED
Glucose-Capillary: 201 mg/dL — ABNORMAL HIGH (ref 70–99)
Glucose-Capillary: 240 mg/dL — ABNORMAL HIGH (ref 70–99)
Glucose-Capillary: 151 mg/dL — ABNORMAL HIGH (ref 70–99)

## 2021-05-29 LAB — GLUCOSE, CAPILLARY: Glucose-Capillary: 216 mg/dL — ABNORMAL HIGH (ref 70–99)

## 2021-05-29 LAB — C-REACTIVE PROTEIN: CRP: 5.1 mg/dL — ABNORMAL HIGH (ref <1.0)

## 2021-05-29 NOTE — ED Notes (Signed)
Patient states he is having more pain.

## 2021-05-29 NOTE — Consult Note (Signed)
[facility]    Date of Admission:  05/28/2021     Reason for Consult: acute on chronic worsening lower back pain, left LE weakness; hx mssa septicemia/vertebral OM    Referring Provider: Arrien   Lines:  Peripheral iv's  Abx: 10/03-c cefazolin  8/15-10/03 Outpatient cephalexin         Assessment: New imaging evidence phlegmon/abscess L1-L3 Hx mssa septicemia (tv endocarditis, septic pulm emboli, TL discitis/OM, psoas abscesses, bilateral shoulder septic arthritis) 01/2021 Lumbar hardware complicating OM/epidural abscess Secondary left shoulder pseudomonal septic arthritis s/p 4 weeks cefepime Dm2  Microbiologic history: 7/6 right shoulder NGTD 7/6 left shoulder PsA -Pansensitive 6/23 left subscapular abscess= abundant MSSA 6/23 left shoulder =MSSA 6/22 blood cx NGTD 6/18 left chest wall = MSSA 6/8 blood cx = MSSA   55 yo male dm2, recent mssa septicemia in 01/2021 as above, including a left septic shoulder started with mssa bursitis but superimposed pseudomonal joint infection, s/p I&D, hardware in lumbar spine, on chronic cephalexin suppression abx, seen in ED on 10/03 for worsening back pain with left sided weakness/decreased sensation with repeat mri showing new phlegmon around L1-3 area  This is all likely mssa Hardware probably main factor for treatment failure despite suppressive oral betalactam  No fever/sepsis this admission. 10/03 bcx in progress  He'll need surgical evaluation and will be transferred to [place]   Plan: Continue cefazolin Await transfer to [facility] for NSG evaluation ID team at [facility] to take over care  4.   Discussed with primary team    I spent 60 minute reviewing data/chart, and coordinating care and >50% direct face to face time providing counseling/discussing diagnostics/treatment plan with patient   ------------------------------------------------ Principal Problem:   Infection of spine  (HCC) Active Problems:   Depression, recurrent (HCC)   Diabetes mellitus (HCC)   Diabetic polyneuropathy associated with type 2 diabetes mellitus (HCC)   HTN (hypertension)   Endocarditis   Psoas abscess (HCC)    HPI: John Ware is a 55 y.o. male dm2, mssa septicemia 01/2021 (tv endocarditis, septic pulm emboli, TL discitis/OM, psoas abscesses, bilateral shoulder septic arthritis), Lumbar hardware complicating OM/epidural abscess Secondary left shoulder pseudomonal septic arthritis s/p 4 weeks cefepime interposed with mssa treatment, on chronic suppressive cephalexin, admitted 10/03 for worsening lumbar back pain with new lle paresis/numbness, found to have on mri imaging new epidural phlegmon/lumbar spinal stenosis at L1-3 area  Patient last saw in clinic by Dr Elinor Parkinson on 9/14. I reviewed prior ID notes He has been compliant with cephalexin oral suppression which he takes tid  On the last visit (phone/video visit) patient was doing well. Lower back pain mild-moderate/stable  However the week prior to admission developed acute on chronic pain with left LE weakness. No urinary/bowel incontinence  No fever/chill  Bilateral shoulders no issue  No rash  He came to ed for evaluation No fever recorded No leukocytosis Mri as belowed L1-3 new epidural phlegmon Bilateral psoas absesses decreased in size Restarted on cefazolin Nsg to evaluate at [facility]     Family History  Problem Relation Age of Onset   Colon cancer Neg Hx     Social History   Tobacco Use   Smoking status: Former    Packs/day: 2.00    Years: 15.00    Pack years: 30.00    Types: Cigarettes    Quit date: 2012    Years since quitting: 10.7   Smokeless tobacco: Never  Vaping Use   Vaping Use: Never used  Substance Use Topics   Alcohol use: Yes    Comment: occasional   Drug use: No    Allergies  Allergen Reactions   Tizanidine Other (See Comments)    Leg cramps    Review of Systems: ROS All  Other ROS was negative, except mentioned above   Past Medical History:  Diagnosis Date   Depression    Diabetes mellitus without complication (HCC)    Hypertension    Spinal stenosis    With Neurogenic Claudication       Scheduled Meds:  insulin aspart  0-9 Units Subcutaneous TID WC   morphine  30 mg Oral BID   Continuous Infusions:   ceFAZolin (ANCEF) IV 2 g (05/29/21 1019)   PRN Meds:.acetaminophen, gabapentin, HYDROmorphone (DILAUDID) injection, methocarbamol, oxyCODONE-acetaminophen **AND** oxyCODONE   OBJECTIVE: Blood pressure (!) 148/99, pulse 93, temperature 99.2 F (37.3 C), temperature source Oral, resp. rate 16, height 5\' 10"  (1.778 m), weight 77.1 kg, SpO2 99 %.  Physical Exam General/constitutional: no distress, pleasant HEENT: Normocephalic, PER, Conj Clear, EOMI, Oropharynx clear Neck supple CV: rrr no mrg Lungs: clear to auscultation, normal respiratory effort Abd: Soft, Nontender Ext: no edema Skin: No Rash Neuro: left lower ext proximal > distal weakness MSK: bilateral shoulder incision healed well no swelling/full nontender ROM. Lumbar spine incision no surrounding fluctuance/tenderness/erythema. Other peripheral joints without swelling/tenderness on rom   Central line presence: no    Lab Results Lab Results  Component Value Date   WBC 6.9 05/29/2021   HGB 12.0 (L) 05/29/2021   HCT 37.5 (L) 05/29/2021   MCV 83.0 05/29/2021   PLT 347 05/29/2021    Lab Results  Component Value Date   CREATININE 0.59 (L) 05/29/2021   BUN 12 05/29/2021   NA 136 05/29/2021   K 4.2 05/29/2021   CL 99 05/29/2021   CO2 30 05/29/2021    Lab Results  Component Value Date   ALT 11 05/28/2021   AST 16 05/28/2021   ALKPHOS 125 05/28/2021   BILITOT 0.4 05/28/2021      Microbiology: Recent Results (from the past 240 hour(s))  Culture, blood (Routine X 2) w Reflex to ID Panel     Status: None (Preliminary result)   Collection Time: 05/28/21  5:20 PM    Specimen: BLOOD  Result Value Ref Range Status   Specimen Description   Final    BLOOD LEFT ANTECUBITAL Performed at [facility], [address] [place], [place]    Special Requests   Final    BOTTLES DRAWN AEROBIC AND ANAEROBIC Blood Culture adequate volume Performed at [facility], [address] [place], [place]    Culture   Final    NO GROWTH < 12 HOURS Performed at [facility], [address] [place], [place]    Report Status PENDING  Incomplete  Culture, blood (Routine X 2) w Reflex to ID Panel     Status: None (Preliminary result)   Collection Time: 05/28/21  5:22 PM   Specimen: BLOOD  Result Value Ref Range Status   Specimen Description   Final    BLOOD RIGHT ANTECUBITAL Performed at [facility], [address], [place], [place]    Special Requests   Final    BOTTLES DRAWN AEROBIC AND ANAEROBIC Blood Culture results may not be optimal due to an excessive volume of blood received in culture bottles  Performed at [facility], [address], [place], [place] [address]    Culture   Final    NO GROWTH < 12 HOURS Performed at [facility], [address], [place], [place] [address]    Report Status PENDING  Incomplete  Resp Panel by RT-PCR (Flu A&B, Covid) Nasopharyngeal Swab     Status: None   Collection Time: 05/28/21  5:30 PM   Specimen: Nasopharyngeal Swab; Nasopharyngeal(NP) swabs in vial transport medium  Result Value Ref Range Status   SARS Coronavirus 2 by RT PCR NEGATIVE NEGATIVE Final    Comment: (NOTE) SARS-CoV-2 target nucleic acids are NOT DETECTED.  The SARS-CoV-2 RNA is generally detectable in upper respiratory specimens during the acute phase of infection. The lowest concentration of SARS-CoV-2 viral copies this assay can detect is 138 copies/mL. A negative result does not preclude SARS-Cov-2 infection and should not be  used as the sole basis for treatment or other patient management decisions. A negative result may occur with  improper specimen collection/handling, submission of specimen other than nasopharyngeal swab, presence of viral mutation(s) within the areas targeted by this assay, and inadequate number of viral copies(<138 copies/mL). A negative result must be combined with clinical observations, patient history, and epidemiological information. The expected result is Negative.  Fact Sheet for Patients:  BloggerCourse.com  Fact Sheet for Healthcare Providers:  SeriousBroker.it  This test is no t yet approved or cleared by the [place] FDA and  has been authorized for detection and/or diagnosis of SARS-CoV-2 by FDA under an Emergency Use Authorization (EUA). This EUA will remain  in effect (meaning this test can be used) for the duration of the COVID-19 declaration under Section 564(b)(1) of the Act, 21 U.S.C.section 360bbb-3(b)(1), unless the authorization is terminated  or revoked sooner.       Influenza A by PCR NEGATIVE NEGATIVE Final   Influenza B by PCR NEGATIVE NEGATIVE Final    Comment: (NOTE) The Xpert Xpress SARS-CoV-2/FLU/RSV plus assay is intended as an aid in the diagnosis of influenza from Nasopharyngeal swab specimens and should not be used as a sole basis for treatment. Nasal washings and aspirates are unacceptable for Xpert Xpress SARS-CoV-2/FLU/RSV testing.  Fact Sheet for Patients: BloggerCourse.com  Fact Sheet for Healthcare Providers: SeriousBroker.it  This test is not yet approved or cleared by the [place] FDA and has been authorized for detection and/or diagnosis of SARS-CoV-2 by FDA under an Emergency Use Authorization (EUA). This EUA will remain in effect (meaning this test can be used) for the duration of the COVID-19 declaration under Section  564(b)(1) of the Act, 21 U.S.C. section 360bbb-3(b)(1), unless the authorization is terminated or revoked.  Performed at [facility], [address] [place], [place] [address]      Serology:    Imaging: If present, new imagings (plain films, ct scans, and mri) have been personally visualized and interpreted; radiology reports have been reviewed. Decision making incorporated into the Impression / Recommendations.  10/03 mri lumbar spine 1. Findings above consistent with ongoing discitis/osteomyelitis at L1-L2 and L2-L3 with progressed endplate irregularity and disc space narrowing. There is new/increased ventral epidural phlegmon along the posterior endplates centered at L1 measuring up to 5 mm in thickness resulting in severe spinal canal stenosis with compression of the cauda equina nerve roots. No evidence of organized intra spinal epidural abscess. 2. Phlegmon is also seen along the ventral aspect of the L1 and L2 vertebral bodies without evidence of paravertebral  abscess. 3. Overall interval decrease in the size and number of bilateral psoas abscesses with small residual abscesses bilaterally as above. Diffuse signal abnormality throughout the remainder of the psoas and paraspinal musculature consistent with infectious/inflammatory myositis. 4. Stable postsurgical changes reflecting L3 through S1 TLIF. Multilevel degenerative changes throughout the lumbar spine described in detail above. 5. No significant interval change in size or appearance of the fluid collection in the posterior soft tissues centered at the L4-L5 disc space favored to reflect a postoperative seroma, though infection can not be excluded by imaging.  Raymondo Band, MD [facility] [phone] pager    05/29/2021, 6:13 PM

## 2021-05-29 NOTE — ED Notes (Signed)
This nurse notified by Gabriel Earing, RN., that pt yelling, "I know how this nursing bullshit works I want my fucking pain medication!"

## 2021-05-29 NOTE — ED Notes (Addendum)
[facility] here to transport 

## 2021-05-29 NOTE — Plan of Care (Signed)

## 2021-05-29 NOTE — ED Notes (Signed)
Report given to Avie Echevaria, RN.

## 2021-05-29 NOTE — ED Notes (Signed)
This nurse exited ED exam room 13 and found pt to be screaming profanities, with security at the bedside as well as EDP Dr. Rhunette Croft, Dr. Freida Busman, ED charge nurse Jerilee Hoh, ED Director Joelyn Oms and ER nurse Janine Limbo, RN. Pt was counseled by Dr. Rhunette Croft on pt's inappropriate behavior including screaming profanities at staff and banging on objects in ED exam room. Pt noted to have disconnected himself from cardiac monitor and standing upright in ED exam room outside of ED stretcher. Pt received pain medication as ordered. Pain medication administered by Jerilee Hoh, RN., as well as Janine Limbo, RN.

## 2021-05-29 NOTE — ED Notes (Signed)
Pt given lunch tray.

## 2021-05-29 NOTE — ED Notes (Signed)
[facility] paged for transport.  

## 2021-05-29 NOTE — ED Notes (Addendum)
This nurse entered the pt's room to find him having detached himself from the cardiac monitor, despite being counseled multiple times that he needed to stay attached. Additionally, pt appeared to be lying uncomfortably in ED stretcher. This nurse asked the pt if he would like to be readjusted, to which he replied, "No I do not." Pt;s wife at bedside to witness.

## 2021-05-29 NOTE — Progress Notes (Signed)
PROGRESS NOTE    John Ware  UXN:[id number] DOB: Jan 02, 1966 DOA: 05/28/2021 PCP: Junie Spencer, FNP    Brief Narrative:  Mr. 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  55 year old male with a recent mitral valve endocarditis, due to MSSA.  He was successfully treated with antibiotic therapy, intravenous and then transition to oral.  Now presents with worsening back pain and focal neurologic deficit on his left lower extremity.  His vital signs were stable with a blood pressure 155/98, heart rate 85, respiratory rate 16, oxygen saturation 97%. His lungs were clear to auscultation bilaterally, heart S1-S2, present, rhythmic, soft abdomen, no lower extremity edema, he did have decreased strength proximal left lower extremity 3 out of 5.   Sodium 142, potassium 4.1, chloride 100, bicarb 32, glucose 192, BUN 15, creatinine 0.67, lactic acid 1.4, white count 10.4, hemoglobin 12.8, hematocrit 39.8, platelets 426. SARS COVID-19 negative.   Urinalysis specific gravity 1.002, negative nitrates.   Lumbar spine MRI with ongoing discitis/osteomyelitis at L1-L2 and L2-L3.  Epidural phlegmon along the posterior endplate centered at L1 with severe spinal canal stenosis with compression of cauda equina nerve roots.  Decreased size and number of bilateral psoas abscesses with small residual abscess bilaterally.  Diffuse signal abnormality throughout the psoas and paraspinal musculature consistent with inflammatory myositis.  Patient is being transferred to [facility] for further neurosurgery evaluation. He has been placed on antibiotic therapy and IV analgesics.   Assessment & Plan:   Principal Problem:   Infection of spine (HCC) Active Problems:   Depression, recurrent (HCC)   Diabetes mellitus (HCC)   Diabetic polyneuropathy associated with type 2 diabetes mellitus (HCC)    HTN (hypertension)   Endocarditis   Psoas abscess (HCC)   L1-L2 and L2-L3 osteomyelitis, complicated with compression of cauda equina nerve roots. Patient with persistent back pain and left lower extremity proximal weakness.  Pending transfer to [facility]  Plan to continue antibiotic therapy with Cefazolin and analgesia with  morphine, oxycodone and hydromorphone  Continue with gabapentin and methocarbamol.   Follow up on blood cultures, temperature curve and cell count.    2.  Type 2 diabetes mellitus.  continue to hold on metformin and empagliflozin.  Continue with insulin sliding scale for glucose coverage and monitoring.   3.  Diabetic neuropathy.  On gabapentin and methocarbamol per his home regimen.   4.  Iron deficiency anemia, Hgb is 12.0 and hct is 37.5  Hgb has been stable, patient not taking iron supplementation  5. HTN. Continue to hold on antihypertensive medications,.    Patient continue to be at high risk for worsening neurologic deficit.   Status is: Inpatient  Remains inpatient appropriate because:Inpatient level of care appropriate due to severity of illness  Dispo: The patient is from: Home              Anticipated d/c is to: Home              Patient currently is not medically stable to d/c.   Difficult to place patient No  DVT prophylaxis: Scd   Code Status:    full  Family Communication:   No family at the bedside      Consultants:  ID Neurosurgery     Antimicrobials:  Cefazolin     Subjective: Patient with persistent pain at the lower back, worse with movement and associate with left  lower extremity weakness, improved pain with analgesics, no nausea or vomiting,   Objective: Vitals:   05/29/21 0100 05/29/21 0200 05/29/21 0600 05/29/21 0700  BP: (!) 124/107 (!) 140/101 (!) 147/109 (!) 143/108  Pulse: 71 72 72 75  Resp: 11 (!) 21 16 17   Temp:      TempSrc:      SpO2: 96% 98% 98% 97%    Intake/Output Summary (Last 24 hours) at 05/29/2021  0849 Last data filed at 05/28/2021 1819 Gross per 24 hour  Intake 1.2 ml  Output --  Net 1.2 ml   There were no vitals filed for this visit.  Examination:   General: Not in pain or dyspnea, deconditioned  Neurology: Awake and alert, decreased strength left lower extremity 3/5 mainly proximal.  E ENT: no pallor, no icterus, oral mucosa moist Cardiovascular: No JVD. S1-S2 present, rhythmic, no gallops, rubs, or murmurs. No lower extremity edema. Pulmonary: positive breath sounds bilaterally, adequate air movement, no wheezing, rhonchi or rales. Gastrointestinal. Abdomen soft and non tender.  Skin. No rashes Musculoskeletal: no joint deformities     Data Reviewed: I have personally reviewed following labs and imaging studies  CBC: Recent Labs  Lab 05/28/21 1353 05/28/21 1948 05/29/21 0404  WBC 10.4 6.8 6.9  NEUTROABS 6.9  --   --   HGB 12.8* 12.4* 12.0*  HCT 39.8 38.7* 37.5*  MCV 82.6 82.9 83.0  PLT 426* 411* 347   Basic Metabolic Panel: Recent Labs  Lab 05/23/21 1121 05/28/21 1353 05/28/21 1948 05/29/21 0404  NA 135 142  --  136  K 5.0 4.1  --  4.2  CL 96* 100  --  99  CO2 30 32  --  30  GLUCOSE 187* 192*  --  211*  BUN 10 15  --  12  CREATININE 0.57* 0.67 0.55* 0.59*  CALCIUM 10.2 10.2  --  9.4   GFR: CrCl cannot be calculated (Unknown ideal weight.). Liver Function Tests: Recent Labs  Lab 05/28/21 1353  AST 16  ALT 11  ALKPHOS 125  BILITOT 0.4  PROT 8.6*  ALBUMIN 3.6   No results for input(s): LIPASE, AMYLASE in the last 168 hours. No results for input(s): AMMONIA in the last 168 hours. Coagulation Profile: No results for input(s): INR, PROTIME in the last 168 hours. Cardiac Enzymes: No results for input(s): CKTOTAL, CKMB, CKMBINDEX, TROPONINI in the last 168 hours. BNP (last 3 results) No results for input(s): PROBNP in the last 8760 hours. HbA1C: Recent Labs    05/28/21 1948  HGBA1C 8.4*   CBG: Recent Labs  Lab 05/28/21 2203   GLUCAP 275*   Lipid Profile: No results for input(s): CHOL, HDL, LDLCALC, TRIG, CHOLHDL, LDLDIRECT in the last 72 hours. Thyroid Function Tests: No results for input(s): TSH, T4TOTAL, FREET4, T3FREE, THYROIDAB in the last 72 hours. Anemia Panel: No results for input(s): VITAMINB12, FOLATE, FERRITIN, TIBC, IRON, RETICCTPCT in the last 72 hours.    Radiology Studies: I have reviewed all of the imaging during this hospital visit personally     Scheduled Meds:  insulin aspart  0-9 Units Subcutaneous TID WC   morphine  30 mg Oral BID   Continuous Infusions:   ceFAZolin (ANCEF) IV 2 g (05/29/21 0128)     LOS: 1 day        Skyanne Welle 07/29/21, MD

## 2021-05-29 NOTE — ED Notes (Signed)
Pt A&O x4. NAD. This nurse re-attached pt to cardiac monitor x3.

## 2021-05-29 NOTE — ED Notes (Addendum)
Patient reports pain in his left  hip and decreased feeling in his left left. Pulses are +2 in the left foot and patient has feeling from touch on the anterior foot, with decreased feeling on the posterior foot.

## 2021-05-29 NOTE — ED Notes (Signed)
Pt declining breakfast at this time stating, "I don't want ass cold oatmeal." Pt additionally declining subcu insulin, stating, "If it's a shot, I ain't taking it." Hospitalist notified and aware. Insulin held at this time. Will continue to monitor.

## 2021-05-30 DIAGNOSIS — M4620 Osteomyelitis of vertebra, site unspecified: Secondary | ICD-10-CM | POA: Diagnosis not present

## 2021-05-30 LAB — GLUCOSE, CAPILLARY
Glucose-Capillary: 186 mg/dL — ABNORMAL HIGH (ref 70–99)
Glucose-Capillary: 173 mg/dL — ABNORMAL HIGH (ref 70–99)
Glucose-Capillary: 245 mg/dL — ABNORMAL HIGH (ref 70–99)
Glucose-Capillary: 201 mg/dL — ABNORMAL HIGH (ref 70–99)

## 2021-05-30 MED ORDER — ADULT MULTIVITAMIN W/MINERALS CH
1.0000 | ORAL_TABLET | Freq: Every day | ORAL | Status: DC
  Administered 2021-05-30 – 2021-06-11 (×13): 1 via ORAL
  Filled 2021-05-30 (×13): qty 1

## 2021-05-30 MED ORDER — GLUCERNA SHAKE PO LIQD
237.0000 mL | Freq: Two times a day (BID) | ORAL | Status: DC
  Administered 2021-05-30 – 2021-06-04 (×10): 237 mL via ORAL
  Filled 2021-05-30: qty 237

## 2021-05-30 MED ORDER — ENSURE MAX PROTEIN PO LIQD
11.0000 [oz_av] | Freq: Every day | ORAL | Status: DC
  Administered 2021-05-30 – 2021-06-10 (×12): 11 [oz_av] via ORAL

## 2021-05-30 NOTE — Progress Notes (Signed)
[facility] for Infectious Disease   Reason for visit: Follow up on osteomyelitis, discitis of the lumbar region  Interval History: seen by Dr. Ellene Route and hopeful for resolution with further antibiotic treatment.  No rash, no diarrhea.  Blood cultures 05/28/21 ngtd   Physical Exam: Constitutional:  Vitals:   05/30/21 0021 05/30/21 0700  BP: 115/85 (!) 135/94  Pulse: 75 74  Resp: 16 18  Temp: 98.6 F (37 C) 98.2 F (36.8 C)  SpO2: 98% 96%   patient appears in NAD Respiratory: Normal respiratory effort; CTA B Cardiovascular: RRR GI: soft, nt, nd  Review of Systems: Constitutional: negative for fevers and chills Integument/breast: negative for rash  Lab Results  Component Value Date   WBC 6.9 05/29/2021   HGB 12.0 (L) 05/29/2021   HCT 37.5 (L) 05/29/2021   MCV 83.0 05/29/2021   PLT 347 05/29/2021    Lab Results  Component Value Date   CREATININE 0.59 (L) 05/29/2021   BUN 12 05/29/2021   NA 136 05/29/2021   K 4.2 05/29/2021   CL 99 05/29/2021   CO2 30 05/29/2021    Lab Results  Component Value Date   ALT 11 05/28/2021   AST 16 05/28/2021   ALKPHOS 125 05/28/2021     Microbiology: Recent Results (from the past 240 hour(s))  Culture, blood (Routine X 2) w Reflex to ID Panel     Status: None (Preliminary result)   Collection Time: 05/28/21  5:20 PM   Specimen: BLOOD  Result Value Ref Range Status   Specimen Description   Final    BLOOD LEFT ANTECUBITAL Performed at [facility], [place] [address] [place], [place] [address]    Special Requests   Final    BOTTLES DRAWN AEROBIC AND ANAEROBIC Blood Culture adequate volume Performed at [facility] [address], [place], [place] [address]    Culture   Final    NO GROWTH 2 DAYS Performed at [facility] [address], [place], [place] [address]    Report Status PENDING  Incomplete  Culture, blood (Routine X 2) w Reflex to ID Panel     Status: None  (Preliminary result)   Collection Time: 05/28/21  5:22 PM   Specimen: BLOOD  Result Value Ref Range Status   Specimen Description   Final    BLOOD RIGHT ANTECUBITAL Performed at [facility] [address] [place], [place] [address]    Special Requests   Final    BOTTLES DRAWN AEROBIC AND ANAEROBIC Blood Culture results may not be optimal due to an excessive volume of blood received in culture bottles Performed at [facility] [address] [place], [place] [address]    Culture   Final    NO GROWTH 2 DAYS Performed at [facility] [address], [place], [place] [address]    Report Status PENDING  Incomplete  Resp Panel by RT-PCR (Flu A&B, Covid) Nasopharyngeal Swab     Status: None   Collection Time: 05/28/21  5:30 PM   Specimen: Nasopharyngeal Swab; Nasopharyngeal(NP) swabs in vial transport medium  Result Value Ref Range Status   SARS Coronavirus 2 by RT PCR NEGATIVE NEGATIVE Final    Comment: (NOTE) SARS-CoV-2 target nucleic acids are NOT DETECTED.  The SARS-CoV-2 RNA is generally detectable in upper respiratory specimens during the acute phase of infection. The lowest concentration of SARS-CoV-2 viral copies this assay can detect is 138 copies/mL.  A negative result does not preclude SARS-Cov-2 infection and should not be used as the sole basis for treatment or other patient management decisions. A negative result may occur with  improper specimen collection/handling, submission of specimen other than nasopharyngeal swab, presence of viral mutation(s) within the areas targeted by this assay, and inadequate number of viral copies(<138 copies/mL). A negative result must be combined with clinical observations, patient history, and epidemiological information. The expected result is Negative.  Fact Sheet for Patients:  EntrepreneurPulse.com.au  Fact Sheet for Healthcare Providers:   IncredibleEmployment.be  This test is no t yet approved or cleared by the [place] FDA and  has been authorized for detection and/or diagnosis of SARS-CoV-2 by FDA under an Emergency Use Authorization (EUA). This EUA will remain  in effect (meaning this test can be used) for the duration of the COVID-19 declaration under Section 564(b)(1) of the Act, 21 U.S.C.section 360bbb-3(b)(1), unless the authorization is terminated  or revoked sooner.       Influenza A by PCR NEGATIVE NEGATIVE Final   Influenza B by PCR NEGATIVE NEGATIVE Final    Comment: (NOTE) The Xpert Xpress SARS-CoV-2/FLU/RSV plus assay is intended as an aid in the diagnosis of influenza from Nasopharyngeal swab specimens and should not be used as a sole basis for treatment. Nasal washings and aspirates are unacceptable for Xpert Xpress SARS-CoV-2/FLU/RSV testing.  Fact Sheet for Patients: EntrepreneurPulse.com.au  Fact Sheet for Healthcare Providers: IncredibleEmployment.be  This test is not yet approved or cleared by the [place] FDA and has been authorized for detection and/or diagnosis of SARS-CoV-2 by FDA under an Emergency Use Authorization (EUA). This EUA will remain in effect (meaning this test can be used) for the duration of the COVID-19 declaration under Section 564(b)(1) of the Act, 21 U.S.C. section 360bbb-3(b)(1), unless the authorization is terminated or revoked.  Performed at [facility], [place] [address], [place], [place] [address]     Impression/Plan:  1. Discitis/osteomyelitis L1-2 and L2-3 - progressive endplate irregularity and disc space narrowing along with an increased ventral epidural phlegmon c/w ongoing or worsening infection.  Some of the findings though may be expected progression during treatment. He has completed prolonged IV treatment and this has been restarted.  Dr. Ellene Route hopeful for resolution  without surgery.  Will plan on 6 weeks of IV cefazolin and watch. Certainly concern with hardware at the site of infection and will still need suppressive antibiotics after treatment completion  CRP 5.1, will check ESR  2.  Access - blood cultures remain negative and ok for a picc line tomorrow if they are still negative.

## 2021-05-30 NOTE — Consult Note (Signed)
Reason for Consult: Left leg weakness, osteomyelitis discitis L1-L2 3 Referring Physician: Raphael Gibney, DO  John Ware is an 55 y.o. male.  HPI: John Ware is a 55 year old individual who had surgery to decompress and stabilize L3 to sacrum in 2019.  About 6 months ago he developed difficulties with him methicillin sensitive staph aureus infection that affected multiple regions of his body and was suspected to have some seeding in his heart valves.  He has been on long-term antibiotics and was managed orally since August but developed increasing back pain and leg pain in the last few weeks.  He started to develop leg weakness.  Repeat MRI of the lumbar spine demonstrates that he has had evolution of osteomyelitis and discitis at L1-2 and L2-3 with phlegmon at this stenosis that has evolved.  He notes that his left leg became weak and he had a difficult time lifting it proximally.  He feels his distal strength has been intact.  He is seen now for further evaluation of this process.  He has been on IV antibiotics for the last 3 days.  He feels that there may be some easing of the back pain lately.  Past Medical History:  Diagnosis Date   Depression    Diabetes mellitus without complication (HCC)    Hypertension    Spinal stenosis    With Neurogenic Claudication    Past Surgical History:  Procedure Laterality Date   CARPAL TUNNEL RELEASE Bilateral    INCISION AND DRAINAGE ABSCESS Left 03/12/2021   Procedure: INCISION AND DRAINAGE ABSCESS;  Surgeon: John Lucy, MD;  Location: [facility];  Service: Orthopedics;  Laterality: Left;   IR US GUIDE BX ASP/DRAIN  02/02/2021   IRRIGATION AND DEBRIDEMENT ABSCESS Left 02/08/2021   Procedure: MINOR INCISION AND DRAINAGE OF ABSCESS;  Surgeon: John Lucy, MD;  Location: [facility];  Service: Orthopedics;  Laterality: Left;   IRRIGATION AND DEBRIDEMENT ABSCESS Left 02/10/2021   Procedure: IRRIGATION AND DEBRIDEMENT ABSCESS LEFT LATERAL CHEST WALL;   Surgeon: John Adu, MD;  Location: [facility];  Service: General;  Laterality: Left;   IRRIGATION AND DEBRIDEMENT ABSCESS Bilateral 02/20/2021   Procedure: MINOR INCISION AND DRAINAGE OF ABSCESS/WITH BILATERAL WOUND VAC PLACEMENT;  Surgeon: John Lucy, MD;  Location: [facility];  Service: Orthopedics;  Laterality: Bilateral;   IRRIGATION AND DEBRIDEMENT SHOULDER Bilateral 02/28/2021   Procedure: IRRIGATION AND DEBRIDEMENT SHOULDER;  Surgeon: [name], MD;  Location: [facility];  Service: Orthopedics;  Laterality: Bilateral;   IRRIGATION AND DEBRIDEMENT SHOULDER Left 03/06/2021   Procedure: IRRIGATION AND DEBRIDEMENT SHOULDER;  Surgeon: [name], MD;  Location: [facility];  Service: Orthopedics;  Laterality: Left;   IRRIGATION AND DEBRIDEMENT SHOULDER Left 03/08/2021   Procedure: IRRIGATION AND DEBRIDEMENT SHOULDER;  Surgeon: John Lucy, MD;  Location: [facility];  Service: Orthopedics;  Laterality: Left;   LUMBAR LAMINECTOMY/DECOMPRESSION MICRODISCECTOMY N/A 12/13/2014   Procedure: LUMBAR THREE-FOUR, LUMBAR FOUR-FIVE, LUMBAR FIVE-SACRAL ONE LAMINECTOMY;  Surgeon: John Abu, MD;  Location: [facility];  Service: Neurosurgery;  Laterality: N/A;  L3-4 L4-5 L5-S1 Laminectomy   MENISCUS REPAIR     Right knee   MINOR APPLICATION OF WOUND VAC Left 02/20/2021   Procedure: MINOR APPLICATION OF KERLIX PACKING/REMOVAL OF WOUND VAC;  Surgeon: John Murphy, MD;  Location: [facility];  Service: General;  Laterality: Left;   TEE WITHOUT CARDIOVERSION N/A 02/05/2021   Procedure: TRANSESOPHAGEAL ECHOCARDIOGRAM (TEE);  Surgeon: John Reichert, MD;  Location: [facility];  Service: Cardiovascular;  Laterality: N/A;  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 His smoking use included cigarettes. He has a 30.00 pack-year smoking history. He has never used smokeless tobacco. He reports current alcohol use. He reports that he does not use drugs.  Allergies:  Allergies  Allergen Reactions   Tizanidine Other (See Comments)    Leg cramps    Medications: I have reviewed the patient's current medications.  Results for orders placed or performed during the hospital encounter of 05/28/21 (from the past 48 hour(s))  Comprehensive metabolic panel     Status: Abnormal   Collection Time: 05/28/21  1:53 PM  Result Value Ref Range   Sodium 142 135 - 145 mmol/L   Potassium 4.1 3.5 - 5.1 mmol/L   Chloride 100 98 - 111 mmol/L   CO2 32 22 - 32 mmol/L   Glucose, Bld 192 (H) 70 - 99 mg/dL    Comment: Glucose reference range applies only to samples taken after fasting for at least 8 hours.   BUN 15 6 - 20 mg/dL   Creatinine, Ser 1.27 0.61 - 1.24 mg/dL   Calcium 51.7 8.9 - 00.1 mg/dL   Total Protein 8.6 (H) 6.5 - 8.1 g/dL   Albumin 3.6 3.5 - 5.0 g/dL   AST 16 15 - 41 U/L   ALT 11 0 - 44 U/L   Alkaline Phosphatase 125 38 - 126 U/L   Total Bilirubin 0.4 0.3 - 1.2 mg/dL   GFR, Estimated >74 >94 mL/min    Comment: (NOTE) Calculated using the CKD-EPI Creatinine Equation (2021)    Anion gap 10 5 - 15    Comment: Performed at [facility], [address] [place], [place] [address]  CBC with Differential     Status: Abnormal   Collection Time: 05/28/21  1:53 PM  Result Value Ref Range   WBC 10.4 4.0 - 10.5 K/uL   RBC 4.82 4.22 - 5.81 MIL/uL   Hemoglobin 12.8 (L) 13.0 - 17.0 g/dL   HCT 91.6 38.4 - 66.5 %   MCV 82.6 80.0 - 100.0 fL   MCH 26.6 26.0 - 34.0 pg   MCHC 32.2 30.0 - 36.0 g/dL   RDW 99.3 (H) 57.0 - 17.7 %   Platelets 426 (H) 150 - 400 K/uL   nRBC 0.0 0.0 - 0.2 %   Neutrophils Relative % 67 %   Neutro  Abs 6.9 1.7 - 7.7 K/uL   Lymphocytes Relative 25 %   Lymphs Abs 2.6 0.7 - 4.0 K/uL   Monocytes Relative 8 %   Monocytes Absolute 0.8 0.1 - 1.0 K/uL   Eosinophils Relative 0 %   Eosinophils Absolute 0.0 0.0 - 0.5 K/uL   Basophils Relative 0 %   Basophils Absolute 0.0 0.0 - 0.1 K/uL   Immature Granulocytes 0 %   Abs Immature Granulocytes 0.04 0.00 - 0.07 K/uL    Comment: Performed at [facility], [address] [place], [place] [address]  Urinalysis, Routine w reflex microscopic     Status: Abnormal   Collection  Time: 05/28/21  1:53 PM  Result Value Ref Range   Color, Urine COLORLESS (A) YELLOW   APPearance CLEAR CLEAR   Specific Gravity, Urine 1.002 (L) 1.005 - 1.030   pH 8.0 5.0 - 8.0   Glucose, UA NEGATIVE NEGATIVE mg/dL   Hgb urine dipstick MODERATE (A) NEGATIVE   Bilirubin Urine NEGATIVE NEGATIVE   Ketones, ur NEGATIVE NEGATIVE mg/dL   Protein, ur NEGATIVE NEGATIVE mg/dL   Nitrite NEGATIVE NEGATIVE   Leukocytes,Ua NEGATIVE NEGATIVE   RBC / HPF 0-5 0 - 5 RBC/hpf   Bacteria, UA NONE SEEN NONE SEEN    Comment: Performed at [facility], [address] [place], [place] [address]  Lactic acid, plasma     Status: None   Collection Time: 05/28/21  1:53 PM  Result Value Ref Range   Lactic Acid, Venous 1.4 0.5 - 1.9 mmol/L    Comment: Performed at [facility], [address] [place], [place] [address]  Lactic acid, plasma     Status: None   Collection Time: 05/28/21  4:37 PM  Result Value Ref Range   Lactic Acid, Venous 1.3 0.5 - 1.9 mmol/L    Comment: Performed at [facility], [address], [place], [place] [address]  Culture, blood (Routine X 2) w Reflex to ID Panel     Status: None (Preliminary result)   Collection Time: 05/28/21  5:20 PM   Specimen: BLOOD  Result Value Ref Range   Specimen Description      BLOOD LEFT ANTECUBITAL Performed at [facility], [address], [place], [place] [address]    Special Requests      BOTTLES DRAWN AEROBIC AND ANAEROBIC Blood Culture adequate volume Performed at [facility], [address], [place], [place] [address]    Culture      NO GROWTH 2 DAYS Performed at [facility], [address], [place], [place] [address]    Report Status PENDING   Culture, blood (Routine X 2) w Reflex to ID Panel     Status: None (Preliminary result)   Collection Time: 05/28/21  5:22 PM   Specimen: BLOOD  Result Value Ref Range   Specimen Description      BLOOD RIGHT ANTECUBITAL Performed at [facility], [address] [place], [place] [address]    Special Requests      BOTTLES DRAWN AEROBIC AND ANAEROBIC Blood Culture results may not be optimal due to an excessive volume of blood received in culture bottles Performed at [facility], [address], [place], [place] [address]    Culture      NO GROWTH 2 DAYS Performed at [facility], [address], [place], [place] [address]    Report Status PENDING   Resp Panel by RT-PCR (Flu A&B, Covid) Nasopharyngeal Swab     Status: None   Collection Time: 05/28/21  5:30 PM   Specimen: Nasopharyngeal Swab; Nasopharyngeal(NP) swabs in vial transport medium  Result Value Ref Range   SARS Coronavirus 2 by RT PCR NEGATIVE NEGATIVE    Comment: (NOTE) SARS-CoV-2 target nucleic acids are NOT DETECTED.  The SARS-CoV-2 RNA is generally detectable in upper respiratory specimens during the acute phase of infection. The lowest concentration of SARS-CoV-2 viral copies this assay can detect is 138 copies/mL. A negative result does not preclude SARS-Cov-2 infection and should not be used as the sole basis for treatment or other  patient management decisions. A negative result may occur with  improper specimen collection/handling, submission of specimen other than nasopharyngeal swab, presence of viral mutation(s) within  the areas targeted by this assay, and inadequate number of viral copies(<138 copies/mL). A negative result must be combined with clinical observations, patient history, and epidemiological information. The expected result is Negative.  Fact Sheet for Patients:  BloggerCourse.com  Fact Sheet for Healthcare Providers:  SeriousBroker.it  This test is no t yet approved or cleared by the [place] FDA and  has been authorized for detection and/or diagnosis of SARS-CoV-2 by FDA under an Emergency Use Authorization (EUA). This EUA will remain  in effect (meaning this test can be used) for the duration of the COVID-19 declaration under Section 564(b)(1) of the Act, 21 U.S.C.section 360bbb-3(b)(1), unless the authorization is terminated  or revoked sooner.       Influenza A by PCR NEGATIVE NEGATIVE   Influenza B by PCR NEGATIVE NEGATIVE    Comment: (NOTE) The Xpert Xpress SARS-CoV-2/FLU/RSV plus assay is intended as an aid in the diagnosis of influenza from Nasopharyngeal swab specimens and should not be used as a sole basis for treatment. Nasal washings and aspirates are unacceptable for Xpert Xpress SARS-CoV-2/FLU/RSV testing.  Fact Sheet for Patients: BloggerCourse.com  Fact Sheet for Healthcare Providers: SeriousBroker.it  This test is not yet approved or cleared by the [place] FDA and has been authorized for detection and/or diagnosis of SARS-CoV-2 by FDA under an Emergency Use Authorization (EUA). This EUA will remain in effect (meaning this test can be used) for the duration of the COVID-19 declaration under Section 564(b)(1) of the Act, 21 U.S.C. section 360bbb-3(b)(1), unless the authorization is terminated or revoked.  Performed at [facility], [address] [place], [place] [address]   CBC     Status: Abnormal   Collection Time: 05/28/21   7:48 PM  Result Value Ref Range   WBC 6.8 4.0 - 10.5 K/uL   RBC 4.67 4.22 - 5.81 MIL/uL   Hemoglobin 12.4 (L) 13.0 - 17.0 g/dL   HCT 60.4 (L) 54.0 - 98.1 %   MCV 82.9 80.0 - 100.0 fL   MCH 26.6 26.0 - 34.0 pg   MCHC 32.0 30.0 - 36.0 g/dL   RDW 19.1 (H) 47.8 - 29.5 %   Platelets 411 (H) 150 - 400 K/uL   nRBC 0.0 0.0 - 0.2 %    Comment: Performed at [facility], [address], [place], [place] [address]  Creatinine, serum     Status: Abnormal   Collection Time: 05/28/21  7:48 PM  Result Value Ref Range   Creatinine, Ser 0.55 (L) 0.61 - 1.24 mg/dL   GFR, Estimated >86 >57 mL/min    Comment: (NOTE) Calculated using the CKD-EPI Creatinine Equation (2021) Performed at [facility], [address], [place], [place] [address]   Hemoglobin A1c     Status: Abnormal   Collection Time: 05/28/21  7:48 PM  Result Value Ref Range   Hgb A1c MFr Bld 8.4 (H) 4.8 - 5.6 %    Comment: (NOTE) Pre diabetes:          5.7%-6.4%  Diabetes:              >6.4%  Glycemic control for   <7.0% adults with diabetes    Mean Plasma Glucose 194.38 mg/dL    Comment: Performed at [facility], [address], [place], [place] [address]  CBG  monitoring, ED     Status: Abnormal   Collection Time: 05/28/21 10:03 PM  Result Value Ref Range   Glucose-Capillary 275 (H) 70 - 99 mg/dL    Comment: Glucose reference range applies only to samples taken after fasting for at least 8 hours.  Basic metabolic panel     Status: Abnormal   Collection Time: 05/29/21  4:04 AM  Result Value Ref Range   Sodium 136 135 - 145 mmol/L   Potassium 4.2 3.5 - 5.1 mmol/L   Chloride 99 98 - 111 mmol/L   CO2 30 22 - 32 mmol/L   Glucose, Bld 211 (H) 70 - 99 mg/dL    Comment: Glucose reference range applies only to samples taken after fasting for at least 8 hours.   BUN 12 6 - 20 mg/dL   Creatinine, Ser 1.61 (L) 0.61 - 1.24 mg/dL   Calcium 9.4 8.9 - 09.6 mg/dL   GFR, Estimated >04 >54  mL/min    Comment: (NOTE) Calculated using the CKD-EPI Creatinine Equation (2021)    Anion gap 7 5 - 15    Comment: Performed at [facility], [address] [place], [place] [address]  CBC     Status: Abnormal   Collection Time: 05/29/21  4:04 AM  Result Value Ref Range   WBC 6.9 4.0 - 10.5 K/uL   RBC 4.52 4.22 - 5.81 MIL/uL   Hemoglobin 12.0 (L) 13.0 - 17.0 g/dL   HCT 91.4 (L) 78.2 - 95.6 %   MCV 83.0 80.0 - 100.0 fL   MCH 26.5 26.0 - 34.0 pg   MCHC 32.0 30.0 - 36.0 g/dL   RDW 21.3 (H) 08.6 - 57.8 %   Platelets 347 150 - 400 K/uL   nRBC 0.0 0.0 - 0.2 %    Comment: Performed at [facility], [address], [place], [place] [address]  CBG monitoring, ED     Status: Abnormal   Collection Time: 05/29/21  8:57 AM  Result Value Ref Range   Glucose-Capillary 201 (H) 70 - 99 mg/dL    Comment: Glucose reference range applies only to samples taken after fasting for at least 8 hours.  CBG monitoring, ED     Status: Abnormal   Collection Time: 05/29/21 12:04 PM  Result Value Ref Range   Glucose-Capillary 240 (H) 70 - 99 mg/dL    Comment: Glucose reference range applies only to samples taken after fasting for at least 8 hours.  CBG monitoring, ED     Status: Abnormal   Collection Time: 05/29/21  5:05 PM  Result Value Ref Range   Glucose-Capillary 151 (H) 70 - 99 mg/dL    Comment: Glucose reference range applies only to samples taken after fasting for at least 8 hours.  C-reactive protein     Status: Abnormal   Collection Time: 05/29/21  5:08 PM  Result Value Ref Range   CRP 5.1 (H) <1.0 mg/dL    Comment: Performed at [facility], [address], [place], [place] [address]  Glucose, capillary     Status: Abnormal   Collection Time: 05/29/21 10:20 PM  Result Value Ref Range   Glucose-Capillary 216 (H) 70 - 99 mg/dL    Comment: Glucose reference range applies only to samples taken after fasting for at least 8 hours.  Glucose,  capillary     Status: Abnormal   Collection Time: 05/30/21  6:41 AM  Result Value Ref Range   Glucose-Capillary 186 (H)  70 - 99 mg/dL    Comment: Glucose reference range applies only to samples taken after fasting for at least 8 hours.    MR Lumbar Spine W Wo Contrast  Result Date: 05/28/2021 CLINICAL DATA:  Back pain and left leg weakness, progressively worsening weakness, decreased sensation EXAM: MRI LUMBAR SPINE WITHOUT AND WITH CONTRAST TECHNIQUE: Multiplanar and multiecho pulse sequences of the lumbar spine were obtained without and with intravenous contrast. CONTRAST:  77mL GADAVIST GADOBUTROL 1 MMOL/ML IV SOLN COMPARISON:  Lumbar spine MRI 02/15/2021 FINDINGS: Segmentation: Standard; the lowest formed disc space is designated L5-S1 Alignment: Straightening of the normal lumbar spine lordosis. There is 6 mm anterolisthesis of L5 on S1, unchanged. Vertebrae: The patient is status post L3 through S1 TLIF. Hardware alignment is grossly within expected limits. There is abnormal T1 hypointensity and STIR hyperintensity in the L1 and L2 vertebral bodies and L1-L2 and L2-L3 disc spaces. Compared to the study from 02/15/2021, endplate irregularity and disc space narrowing has worsened. There is enhancement along the posterior endplate of the L1 and L2 vertebral bodies measuring up to 5 mm in thickness at L1 (16-10), new/increased since 02/15/2021. To a lesser degree there is enhancement along the dorsal lateral aspects of the canal (17-28). There is no definite evidence of central non enhancement to suggest epidural abscess. Conus medullaris and cauda equina: Conus extends to the mid L1 level. There is no definite abnormal enhancement of the cauda equina nerve roots. Paraspinal and other soft tissues: There is abnormal T2 hyperintensity throughout the paraspinal musculature and bilateral psoas muscles. There is a 0.8 cm by 0.8 cm collection within the right psoas muscle and an irregular collection in the left  psoas muscle measuring 1.2 cm x 0.9 cm concerning for residual abscesses, though overall the degree of intramuscular abscesses in the psoas muscles has markedly improved. There is ill-defined abnormal enhancement along the ventral vertebral bodies centered at L1-L2. There is no evidence of prevertebral abscess. There is a fluid collection in the posterior soft tissues centered at the L4-L5 disc space measuring 5.9 cm cc by 2.4 cm AP, grossly similar in size and appearance. Disc levels: T12-L1: There is a mild disc bulge eccentric to the right, ligamentum flavum thickening, and bilateral facet arthropathy resulting in crowding of the right subarticular zone with no high-grade spinal canal or neural foraminal stenosis. L1-L2: Above described endplate irregularity and ventral epidural phlegmon results in severe spinal canal stenosis with compression of the cauda equina nerve roots. Phlegmon extends into the bilateral neural foramina with likely irritation of the exiting nerve roots. L2-L3: This level is suboptimally assessed due to artifact from adjacent hardware. There is bulky ligamentum flavum thickening and bilateral facet arthropathy resulting in moderate spinal canal stenosis and moderate bilateral neural foraminal stenosis L3-L4: Postsurgical changes reflecting prior TLIF are again seen. There is no residual significant spinal canal or neural foraminal stenosis. L4-L5: Postsurgical changes reflecting prior T liver again seen. There is no residual high-grade spinal canal or neural foraminal stenosis. L5-S1: There is grade 1 anterolisthesis of L5 on S1. Postsurgical changes reflecting prior T liver again seen. There is no residual high-grade spinal canal stenosis. There is moderate bilateral neural foraminal stenosis primarily due to the anterolisthesis. IMPRESSION: 1. Findings above consistent with ongoing discitis/osteomyelitis at L1-L2 and L2-L3 with progressed endplate irregularity and disc space narrowing.  There is new/increased ventral epidural phlegmon along the posterior endplates centered at L1 measuring up to 5 mm in thickness resulting in severe spinal canal  stenosis with compression of the cauda equina nerve roots. No evidence of organized intra spinal epidural abscess. 2. Phlegmon is also seen along the ventral aspect of the L1 and L2 vertebral bodies without evidence of paravertebral abscess. 3. Overall interval decrease in the size and number of bilateral psoas abscesses with small residual abscesses bilaterally as above. Diffuse signal abnormality throughout the remainder of the psoas and paraspinal musculature consistent with infectious/inflammatory myositis. 4. Stable postsurgical changes reflecting L3 through S1 TLIF. Multilevel degenerative changes throughout the lumbar spine described in detail above. 5. No significant interval change in size or appearance of the fluid collection in the posterior soft tissues centered at the L4-L5 disc space favored to reflect a postoperative seroma, though infection can not be excluded by imaging. Electronically Signed   By: Lesia Hausen M.D.   On: 05/28/2021 16:15    Review of Systems  Constitutional:  Positive for activity change.  HENT: Negative.    Eyes: Negative.   Respiratory: Negative.    Cardiovascular: Negative.   Gastrointestinal: Negative.   Endocrine: Negative.   Genitourinary: Negative.   Musculoskeletal:  Positive for back pain, gait problem and myalgias.  Allergic/Immunologic: Negative.   Neurological:  Positive for weakness and numbness.  Hematological: Negative.   Psychiatric/Behavioral: Negative.    Blood pressure (!) 135/94, pulse 74, temperature 98.2 F (36.8 C), temperature source Oral, resp. rate 18, height  (1.778 m), weight 77.1 kg, SpO2 96 %. Physical Exam Constitutional:      Appearance: Normal appearance. He is normal weight.  HENT:     Head: Normocephalic and atraumatic.     Right Ear: Tympanic membrane normal.      Left Ear: Tympanic membrane normal.     Nose: Nose normal.     Mouth/Throat:     Mouth: Mucous membranes are moist.  Eyes:     Extraocular Movements: Extraocular movements intact.     Conjunctiva/sclera: Conjunctivae normal.     Pupils: Pupils are equal, round, and reactive to light.  Cardiovascular:     Rate and Rhythm: Normal rate and regular rhythm.     Pulses: Normal pulses.     Heart sounds: Normal heart sounds.  Pulmonary:     Effort: Pulmonary effort is normal.     Breath sounds: Normal breath sounds.  Abdominal:     General: Abdomen is flat.     Palpations: Abdomen is soft.  Musculoskeletal:     Cervical back: Normal range of motion and neck supple.     Comments: Positive straight leg raising at 30 degrees on the left to 60 degrees on the right [name]'s maneuver is negative  Skin:    General: Skin is warm and dry.     Capillary Refill: Capillary refill takes less than 2 seconds.  Neurological:     Mental Status: He is alert.     Comments: Proximal weakness in the iliopsoas at 3 out of 5 quadriceps at 4 out of 5 tibialis anterior and gastrocs at 4 out of 5 in the left lower extremity 5 out of 5 strength in the right lower extremity.  Sensation appears intact to pin light touch throughout both lower extremities and the trunk.  Upper extremity strength and reflexes is intact.    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  At this point if he is seeing some marginal improvement with IV antibiotics I would continue the course with the IV antibiotics to see if he can resolve this  without the need for surgical intervention.  I discussed with him the nature of surgery to decompress this process and he certainly would like to avoid this if at all possible.  We will follow along with you.  Shary Key Shaquasha Gerstel 05/30/2021, 9:27 AM

## 2021-05-30 NOTE — Progress Notes (Signed)
Initial Nutrition Assessment  DOCUMENTATION CODES:   Not applicable  INTERVENTION:   -Magic cup TID with meals, each supplement provides 290 kcal and 9 grams of protein  -Glucerna Shake po BID, each supplement provides 220 kcal and 10 grams of protein  -Ensure Max po daily, each supplement provides 150 kcal and 30 grams of protein.   -MVI with minerals daily  NUTRITION DIAGNOSIS:   Increased nutrient needs related to acute illness (osteomyelitis) as evidenced by estimated needs.  GOAL:   Patient will meet greater than or equal to 90% of their needs  MONITOR:   PO intake, Supplement acceptance, Labs, Weight trends, Skin, I & O's  REASON FOR ASSESSMENT:   Malnutrition Screening Tool    ASSESSMENT:   John Ware is a 55 y.o. 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  He had bilateral shoulder debridement, and drain placement.  He was discharged with IV antibiotic therapy.  PICC line cefepime and then transition to oral cephalexin.  Pr admitted with L1-L2 and L2-L3 osteomyelitis, complicated with compression of cauda of equina nerve roots.   Reviewed I/O's: -2.2 L x 24 hours and -2.2 L since admission  UOP: 2.2 L x 24 hours  Pt sleeping soundly at time of visit. He did not respond to voice. No family or supports at bedside to provide further history.  Observed breakfast tray. Pt consumed grits, but left 50-75% of tray unconsumed.   Reviewed wt hx; pt has experienced a 14% wt loss over the past 6 months.   Pt with increased nutritional needs and would benefit from addition of oral nutrition supplements.   Medications reviewed and include morphine.   Lab Results  Component Value Date   HGBA1C 8.4 (H) 05/28/2021   PTA DM medications are 1500 mg metformin daily and 10 mg  empagliflozin daily.   Labs reviewed: CBGS: 151-240 (inpatient orders for glycemic control are 0-9 units insulin aspart TID with meals).    NUTRITION - FOCUSED PHYSICAL EXAM:  Flowsheet Row Most Recent Value  Orbital Region No depletion  Upper Arm Region No depletion  Thoracic and Lumbar Region No depletion  Buccal Region No depletion  Temple Region No depletion  Clavicle Bone Region No depletion  Clavicle and Acromion Bone Region No depletion  Scapular Bone Region No depletion  Dorsal Hand No depletion  Patellar Region No depletion  Anterior Thigh Region No depletion  Posterior Calf Region No depletion  Edema (RD Assessment) None  Hair Reviewed  Eyes Reviewed  Mouth Reviewed  Skin Reviewed  Nails Reviewed       Diet Order:   Diet Order             Diet heart healthy/carb modified Room service appropriate? Yes; Fluid consistency: Thin  Diet effective now                   EDUCATION NEEDS:   No education needs have been identified at this time  Skin:  Skin Assessment: Reviewed RN Assessment  Last BM:  05/28/21  Height:   Ht Readings from Last 1 Encounters:  05/29/21 5\' 10"  (1.778 m)    Weight:   Wt Readings from Last 1 Encounters:  05/29/21 77.1 kg    Ideal Body Weight:  75.5 kg  BMI:  Body mass index is 24.39 kg/m.  Estimated Nutritional Needs:  Kcal:  1900-2100  Protein:  115-130 grams  Fluid:  > 1.9 L    Levada Schilling, RD, LDN, CDCES Registered Dietitian II Certified Diabetes Care and Education Specialist Please refer to [facility] for RD and/or RD on-call/weekend/after hours pager

## 2021-05-30 NOTE — Progress Notes (Signed)
PROGRESS NOTE  John Ware JKK:[id number] DOB: 09-03-1965 DOA: 05/28/2021 PCP: Junie Spencer, FNP  HPI/Recap of past 62 hours:  55 year old male with a recent mitral valve endocarditis, due to MSSA.  He was successfully treated with antibiotic therapy, intravenous and then transition to oral.  Now presents with worsening back pain and focal neurologic deficit on his left lower extremity.  His vital signs were stable with a blood pressure 155/98, heart rate 85, respiratory rate 16, oxygen saturation 97%. His lungs were clear to auscultation bilaterally, heart S1-S2, present, rhythmic, soft abdomen, no lower extremity edema, he did have decreased strength proximal left lower extremity 3 out of 5.   Lumbar spine MRI with ongoing discitis/osteomyelitis at L1-L2 and L2-L3.  Epidural phlegmon along the posterior endplate centered at L1 with severe spinal canal stenosis with compression of cauda equina nerve roots.  Decreased size and number of bilateral psoas abscesses with small residual abscess bilaterally.  Diffuse signal abnormality throughout the psoas and paraspinal musculature consistent with inflammatory myositis.   Patient was transferred to [facility] for further neurosurgery evaluation.  He is currently on empiric IV antibiotics and on pain management.  05/30/2021: Patient was seen and examined at bedside.  Reports his back pain is improved 3 out of 10 after IV pain medication.    Assessment/Plan: Principal Problem:   Infection of spine (HCC) Active Problems:   Depression, recurrent (HCC)   Diabetes mellitus (HCC)   Diabetic polyneuropathy associated with type 2 diabetes mellitus (HCC)   HTN (hypertension)   Endocarditis   Psoas abscess (HCC)   L1-L2 and L2-L3 osteomyelitis, complicated with compression of cauda equina nerve roots. Patient with persistent back pain and left lower extremity proximal weakness.  Plan to continue antibiotic therapy with Cefazolin and analgesia with   morphine, oxycodone and hydromorphone  Continue with gabapentin and methocarbamol.   Follow up on blood cultures Monitor fever curve and WBC CRP 5.1, on 05/29/2021 Sed rate 77 on 04/09/2021. Neurosurgery consulted, following.   Type 2 diabetes mellitus with hyperglycemia.   Hemoglobin A1c 8.4 on 05/28/2021.   Hold off home oral hypoglycemics, metformin and empagliflozin.  Continue with insulin sliding scale for glucose coverage and monitoring.   Diabetic neuropathy.  On gabapentin and methocarbamol per his home regimen.   Iron deficiency anemia, Hgb is 12.0 and hct is 37.5  Hgb has been stable, patient not taking iron supplementation   HTN.  BP stable.  Continue to hold on antihypertensive medications,.      Patient continue to be at high risk for worsening neurologic deficit.    Status is: Inpatient   Remains inpatient appropriate because:Inpatient level of care appropriate due to severity of illness   Dispo: The patient is from: Home              Anticipated d/c is to: Home              Patient currently is not medically stable to d/c.              Difficult to place patient No   DVT prophylaxis:      Scd   Code Status:               full  Family Communication:        No family at the bedside         Consultants:  ID Neurosurgery        Antimicrobials:  Cefazolin  Code Status: Full code  Family Communication: None at bedside  Disposition Plan: Possibly CIR or home with home health services once neurosurgery signs off.   Consultants: Neurosurgery  Procedures: None  Antimicrobials: Cefazolin  DVT prophylaxis: SCDs  Status is: Inpatient    Dispo: The patient is from: Home.              Anticipated d/c is to: Possibly CIR              Patient currently medically not stable for discharge.   Difficult to place patient not applicable.        Objective: Vitals:   05/29/21 1756 05/29/21 2028 05/30/21 0021 05/30/21 0700  BP: (!) 148/99  129/84 115/85 (!) 135/94  Pulse: 93 91 75 74  Resp: 16 15 16 18   Temp: 99.2 F (37.3 C) 99.3 F (37.4 C) 98.6 F (37 C) 98.2 F (36.8 C)  TempSrc: Oral Oral Oral Oral  SpO2: 99% 97% 98% 96%  Weight: 77.1 kg     Height: 5\' 10"  (1.778 m)       Intake/Output Summary (Last 24 hours) at 05/30/2021 1311 Last data filed at 05/30/2021 0500 Gross per 24 hour  Intake --  Output 2200 ml  Net -2200 ml   Filed Weights   05/29/21 1756  Weight: 77.1 kg    Exam:  General: 55 y.o. year-old male well developed well nourished in no acute distress.  Alert and oriented x3. Cardiovascular: Regular rate and rhythm with no rubs or gallops.  No thyromegaly or JVD noted.   Respiratory: Clear to auscultation with no wheezes or rales. Good inspiratory effort. Abdomen: Soft nontender nondistended with normal bowel sounds x4 quadrants. Musculoskeletal: No lower extremity edema. 2/4 pulses in all 4 extremities. Skin: No ulcerative lesions noted or rashes, Psychiatry: Mood is appropriate for condition and setting   Data Reviewed: CBC: Recent Labs  Lab 05/28/21 1353 05/28/21 1948 05/29/21 0404  WBC 10.4 6.8 6.9  NEUTROABS 6.9  --   --   HGB 12.8* 12.4* 12.0*  HCT 39.8 38.7* 37.5*  MCV 82.6 82.9 83.0  PLT 426* 411* 347   Basic Metabolic Panel: Recent Labs  Lab 05/28/21 1353 05/28/21 1948 05/29/21 0404  NA 142  --  136  K 4.1  --  4.2  CL 100  --  99  CO2 32  --  30  GLUCOSE 192*  --  211*  BUN 15  --  12  CREATININE 0.67 0.55* 0.59*  CALCIUM 10.2  --  9.4   GFR: Estimated Creatinine Clearance: 110.3 mL/min (A) (by C-G formula based on SCr of 0.59 mg/dL (L)). Liver Function Tests: Recent Labs  Lab 05/28/21 1353  AST 16  ALT 11  ALKPHOS 125  BILITOT 0.4  PROT 8.6*  ALBUMIN 3.6   No results for input(s): LIPASE, AMYLASE in the last 168 hours. No results for input(s): AMMONIA in the last 168 hours. Coagulation Profile: No results for input(s): INR, PROTIME in the last 168  hours. Cardiac Enzymes: No results for input(s): CKTOTAL, CKMB, CKMBINDEX, TROPONINI in the last 168 hours. BNP (last 3 results) No results for input(s): PROBNP in the last 8760 hours. HbA1C: Recent Labs    05/28/21 1948  HGBA1C 8.4*   CBG: Recent Labs  Lab 05/29/21 1204 05/29/21 1705 05/29/21 2220 05/30/21 0641 05/30/21 1155  GLUCAP 240* 151* 216* 186* 173*   Lipid Profile: No results for input(s): CHOL, HDL, LDLCALC, TRIG, CHOLHDL, LDLDIRECT in the last  72 hours. Thyroid Function Tests: No results for input(s): TSH, T4TOTAL, FREET4, T3FREE, THYROIDAB in the last 72 hours. Anemia Panel: No results for input(s): VITAMINB12, FOLATE, FERRITIN, TIBC, IRON, RETICCTPCT in the last 72 hours. Urine analysis:    Component Value Date/Time   COLORURINE COLORLESS (A) 05/28/2021 1353   APPEARANCEUR CLEAR 05/28/2021 1353   LABSPEC 1.002 (L) 05/28/2021 1353   PHURINE 8.0 05/28/2021 1353   GLUCOSEU NEGATIVE 05/28/2021 1353   HGBUR MODERATE (A) 05/28/2021 1353   BILIRUBINUR NEGATIVE 05/28/2021 1353   KETONESUR NEGATIVE 05/28/2021 1353   PROTEINUR NEGATIVE 05/28/2021 1353   NITRITE NEGATIVE 05/28/2021 1353   LEUKOCYTESUR NEGATIVE 05/28/2021 1353   Sepsis Labs: @LABRCNTIP (procalcitonin:4,lacticidven:4)  ) Recent Results (from the past 240 hour(s))  Culture, blood (Routine X 2) w Reflex to ID Panel     Status: None (Preliminary result)   Collection Time: 05/28/21  5:20 PM   Specimen: BLOOD  Result Value Ref Range Status   Specimen Description   Final    BLOOD LEFT ANTECUBITAL Performed at [facility], [address], [place], [place]    Special Requests   Final    BOTTLES DRAWN AEROBIC AND ANAEROBIC Blood Culture adequate volume Performed at [facility], [address] [place], [place]    Culture   Final    NO GROWTH 2 DAYS Performed at [facility], [address], [place], [place]    Report  Status PENDING  Incomplete  Culture, blood (Routine X 2) w Reflex to ID Panel     Status: None (Preliminary result)   Collection Time: 05/28/21  5:22 PM   Specimen: BLOOD  Result Value Ref Range Status   Specimen Description   Final    BLOOD RIGHT ANTECUBITAL Performed at [facility], [address], [place], [place]    Special Requests   Final    BOTTLES DRAWN AEROBIC AND ANAEROBIC Blood Culture results may not be optimal due to an excessive volume of blood received in culture bottles Performed at [facility], [address] [place], [place]    Culture   Final    NO GROWTH 2 DAYS Performed at [facility], [address], [place], [place]    Report Status PENDING  Incomplete  Resp Panel by RT-PCR (Flu A&B, Covid) Nasopharyngeal Swab     Status: None   Collection Time: 05/28/21  5:30 PM   Specimen: Nasopharyngeal Swab; Nasopharyngeal(NP) swabs in vial transport medium  Result Value Ref Range Status   SARS Coronavirus 2 by RT PCR NEGATIVE NEGATIVE Final    Comment: (NOTE) SARS-CoV-2 target nucleic acids are NOT DETECTED.  The SARS-CoV-2 RNA is generally detectable in upper respiratory specimens during the acute phase of infection. The lowest concentration of SARS-CoV-2 viral copies this assay can detect is 138 copies/mL. A negative result does not preclude SARS-Cov-2 infection and should not be used as the sole basis for treatment or other patient management decisions. A negative result may occur with  improper specimen collection/handling, submission of specimen other than nasopharyngeal swab, presence of viral mutation(s) within the areas targeted by this assay, and inadequate number of viral copies(<138 copies/mL). A negative result must be combined with clinical observations, patient history, and epidemiological information. The expected result is Negative.  Fact Sheet for Patients:   07/28/21  Fact Sheet for Healthcare Providers:  BloggerCourse.com  This test is no t yet  approved or cleared by the [place] and  has been authorized for detection and/or diagnosis of SARS-CoV-2 by FDA under an Emergency Use Authorization (EUA). This EUA will remain  in effect (meaning this test can be used) for the duration of the COVID-19 declaration under Section 564(b)(1) of the Act, 21 U.S.C.section 360bbb-3(b)(1), unless the authorization is terminated  or revoked sooner.       Influenza A by PCR NEGATIVE NEGATIVE Final   Influenza B by PCR NEGATIVE NEGATIVE Final    Comment: (NOTE) The Xpert Xpress SARS-CoV-2/FLU/RSV plus assay is intended as an aid in the diagnosis of influenza from Nasopharyngeal swab specimens and should not be used as a sole basis for treatment. Nasal washings and aspirates are unacceptable for Xpert Xpress SARS-CoV-2/FLU/RSV testing.  Fact Sheet for Patients: BloggerCourse.com  Fact Sheet for Healthcare Providers: SeriousBroker.it  This test is not yet approved or cleared by the [place] FDA and has been authorized for detection and/or diagnosis of SARS-CoV-2 by FDA under an Emergency Use Authorization (EUA). This EUA will remain in effect (meaning this test can be used) for the duration of the COVID-19 declaration under Section 564(b)(1) of the Act, 21 U.S.C. section 360bbb-3(b)(1), unless the authorization is terminated or revoked.  Performed at [facility], [address], [place], [place] [address]       Studies: No results found.  Scheduled Meds:  insulin aspart  0-9 Units Subcutaneous TID WC   morphine  30 mg Oral BID    Continuous Infusions:   ceFAZolin (ANCEF) IV 2 g (05/30/21 0810)     LOS: 2 days     Darlin Drop, MD [facility] Pager [phone]  If 7PM-7AM, please  contact night-coverage [email or web address] Password [email or web address] 05/30/2021, 1:11 PM

## 2021-05-30 NOTE — TOC Initial Note (Signed)
Transition of Care [facility]) - Initial/Assessment Note    Patient Details  Name: John Ware MRN: [id number] Date of Birth: 24-Aug-1966  Transition of Care [facility]) CM/SW Contact:    Epifanio Lesches, RN Phone Number: 05/30/2021, 1:37 PM  Clinical Narrative:        Presents with osteomyelitis discitis at L1-2 and L2-3, L leg weakness. 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  From home with wife. PTA independent with ADL's. Uses cane intermittently.   PCP: Jannifer Rodney        Neuro surgery following ....hoping IV ABX will improve symptoms, and avoid surgical need.  Referral made with Pam/[facility] for ? LT ABX therapy need. Pam states will assist with landing HHRN if needed ( [facility] vs [facility]). Pt states has used [facility] in the past and ok with using again if needed.  TOC team will continue to monitor and assist with TOC needs.  Expected Discharge Plan: Home w Home Health Services Barriers to Discharge: Continued Medical Work up   Patient Goals and CMS Choice     Choice offered to / list presented to : Patient  Expected Discharge Plan and Services Expected Discharge Plan: Home w Home Health Services   Discharge Planning Services: CM Consult                     DME Arranged: Other see comment (IV ABX therapy / [facility]) DME Agency: Other - Comment Date DME Agency Contacted: 05/30/21 Time DME Agency Contacted: 1329 Representative spoke with at DME Agency: Pam HH Arranged: RN          Prior Living Arrangements/Services   Lives with:: Spouse Patient language and need for interpreter reviewed:: Yes Do you feel safe going back to the place where you live?: Yes      Need for Family Participation in Patient Care: Yes (Comment) Care giver support system in place?: Yes (comment)    Criminal Activity/Legal Involvement Pertinent to Current Situation/Hospitalization: No - Comment as needed  Activities of Daily Living Home Assistive Devices/Equipment: Cane (specify quad or straight), Eyeglasses, Walker (specify type) ADL Screening (condition at time of admission) Patient's cognitive ability adequate to safely complete daily activities?: Yes Is the patient deaf or have difficulty hearing?: No Does the patient have difficulty seeing, even when wearing glasses/contacts?: No Does the patient have difficulty concentrating, remembering, or making decisions?: No Patient able to express need for assistance with ADLs?: Yes Does the patient have difficulty dressing or bathing?: Yes Independently performs ADLs?: No Communication: Independent Dressing (OT): Needs assistance Is this a change from baseline?: Pre-admission baseline Grooming: Independent Feeding: Independent Bathing: Needs assistance Is this a change from baseline?: Pre-admission baseline Toileting: Needs assistance Is this a change from baseline?: Pre-admission baseline In/Out Bed: Needs assistance Is this a change from baseline?: Pre-admission baseline Walks in Home: Needs assistance Is this a change from baseline?: Pre-admission baseline Does the patient have difficulty walking or climbing stairs?: Yes Weakness of Legs: Both Weakness of Arms/Hands: None  Permission Sought/Granted   Permission granted to share information with : Yes, Verbal Permission Granted  Share Information with NAME: Nymir Ringler (Spouse)  [phone]           Emotional Assessment Appearance:: Appears stated age Attitude/Demeanor/Rapport: Gracious Affect (typically observed): Accepting Orientation: : Oriented to Self, Oriented to Place, Oriented to  Time, Oriented  to Situation Alcohol / Substance Use: Not Applicable Psych Involvement: No (comment)  Admission diagnosis:  Acute osteomyelitis of lumbar spine (HCC)  [M46.26] Infection of spine (HCC) [M46.20] Patient Active Problem List   Diagnosis Date Noted   Infection of spine (HCC) 05/28/2021   Medication monitoring encounter 05/09/2021   MSSA (methicillin susceptible Staphylococcus aureus) infection 05/09/2021   Abscess    History of COVID-19    Psoas abscess (HCC)    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   Right shoulder pain 02/14/2021   Chest wall abscess 02/14/2021   Obesity 02/14/2021   Normocytic anemia 02/14/2021   Thrombocytosis 02/14/2021   Endocarditis 02/05/2021   Vision changes 02/04/2021   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   Pressure injury of skin 02/01/2021   HTN (hypertension) 02/01/2021   Anxiety 02/01/2021   Left shoulder pain 02/01/2021   Hyponatremia 01/31/2021   Positive colorectal cancer screening using Cologuard test 12/28/2020   DOE (dyspnea on exertion) 10/20/2020   Diabetic polyneuropathy associated with type 2 diabetes mellitus (HCC) 10/09/2020   Diabetes mellitus (HCC) 10/30/2018   Lumbar stenosis with neurogenic claudication 10/14/2017   DDD (degenerative disc disease), lumbar 02/17/2017   Left leg weakness 02/05/2017   Hyperlipidemia associated with type 2 diabetes mellitus (HCC) 10/24/2016   Depression, recurrent (HCC) 10/24/2016   Right knee pain 05/21/2016   DDD (degenerative disc disease), lumbosacral 05/21/2016   Overweight (BMI 25.0-29.9) 05/21/2016   Lumbar stenosis 12/13/2014   PCP:  Junie Spencer, FNP Pharmacy:   [facility] [place], [place] - [address] [place] [address] Phone: [phone] Fax: [phone]     Social Determinants of Health (SDOH) Interventions    Readmission Risk Interventions Readmission Risk Prevention Plan 02/16/2021  Transportation Screening Complete  PCP or Specialist Appt within 3-5 Days Complete  HRI or Home Care Consult Complete  Social Work Consult for Recovery Care Planning/Counseling Complete   Palliative Care Screening Complete  Medication Review Oceanographer) Complete  Some recent data might be hidden

## 2021-05-31 ENCOUNTER — Ambulatory Visit: Payer: Commercial Managed Care - PPO | Admitting: Infectious Disease

## 2021-05-31 DIAGNOSIS — M4620 Osteomyelitis of vertebra, site unspecified: Secondary | ICD-10-CM | POA: Diagnosis not present

## 2021-05-31 LAB — BLOOD CULTURE ID PANEL (REFLEXED) - BCID2

## 2021-05-31 LAB — GLUCOSE, CAPILLARY
Glucose-Capillary: 210 mg/dL — ABNORMAL HIGH (ref 70–99)
Glucose-Capillary: 232 mg/dL — ABNORMAL HIGH (ref 70–99)
Glucose-Capillary: 144 mg/dL — ABNORMAL HIGH (ref 70–99)
Glucose-Capillary: 242 mg/dL — ABNORMAL HIGH (ref 70–99)

## 2021-05-31 LAB — SEDIMENTATION RATE: Sed Rate: 44 mm/hr — ABNORMAL HIGH (ref 0–16)

## 2021-05-31 MED ORDER — SENNOSIDES-DOCUSATE SODIUM 8.6-50 MG PO TABS
2.0000 | ORAL_TABLET | Freq: Every day | ORAL | Status: DC
  Administered 2021-05-31 – 2021-06-09 (×4): 2 via ORAL
  Filled 2021-05-31 (×11): qty 2

## 2021-05-31 NOTE — Plan of Care (Signed)

## 2021-05-31 NOTE — Progress Notes (Signed)
PHARMACY - PHYSICIAN COMMUNICATION CRITICAL VALUE ALERT - BLOOD CULTURE IDENTIFICATION (BCID)  John Ware is an 55 y.o. male with h/o MSSA septicemia who presented to [facility] on 05/28/2021 with a chief complaint of LE weakness/back pain, found to have discitis/osteomyelitis  Assessment:   1/2 blood cultures drawn 10/3 growing MSSA  Name of physician (or Provider) Contacted:  Dr. Rachael Darby  Current antibiotics:  Ancef  Changes to prescribed antibiotics recommended:   No changes at this time  Results for orders placed or performed during the hospital encounter of 05/28/21  Blood Culture ID Panel (Reflexed) (Collected: 05/28/2021  5:22 PM)  Result Value Ref Range   Enterococcus faecalis NOT DETECTED NOT DETECTED   Enterococcus Faecium NOT DETECTED NOT DETECTED   Listeria monocytogenes NOT DETECTED NOT DETECTED   Staphylococcus species DETECTED (A) NOT DETECTED   Staphylococcus aureus (BCID) DETECTED (A) NOT DETECTED   Staphylococcus epidermidis NOT DETECTED NOT DETECTED   Staphylococcus lugdunensis NOT DETECTED NOT DETECTED   Streptococcus species NOT DETECTED NOT DETECTED   Streptococcus agalactiae NOT DETECTED NOT DETECTED   Streptococcus pneumoniae NOT DETECTED NOT DETECTED   Streptococcus pyogenes NOT DETECTED NOT DETECTED   A.calcoaceticus-baumannii NOT DETECTED NOT DETECTED   Bacteroides fragilis NOT DETECTED NOT DETECTED   Enterobacterales NOT DETECTED NOT DETECTED   Enterobacter cloacae complex NOT DETECTED NOT DETECTED   Escherichia coli NOT DETECTED NOT DETECTED   Klebsiella aerogenes NOT DETECTED NOT DETECTED   Klebsiella oxytoca NOT DETECTED NOT DETECTED   Klebsiella pneumoniae NOT DETECTED NOT DETECTED   Proteus species NOT DETECTED NOT DETECTED   Salmonella species NOT DETECTED NOT DETECTED   Serratia marcescens NOT DETECTED NOT DETECTED   Haemophilus influenzae NOT DETECTED NOT DETECTED   Neisseria meningitidis NOT DETECTED NOT DETECTED   Pseudomonas  aeruginosa NOT DETECTED NOT DETECTED   Stenotrophomonas maltophilia NOT DETECTED NOT DETECTED   Candida albicans NOT DETECTED NOT DETECTED   Candida auris NOT DETECTED NOT DETECTED   Candida glabrata NOT DETECTED NOT DETECTED   Candida krusei NOT DETECTED NOT DETECTED   Candida parapsilosis NOT DETECTED NOT DETECTED   Candida tropicalis NOT DETECTED NOT DETECTED   Cryptococcus neoformans/gattii NOT DETECTED NOT DETECTED   Meth resistant mecA/C and MREJ NOT DETECTED NOT DETECTED    [name] 05/31/2021  3:05 AM

## 2021-05-31 NOTE — Progress Notes (Signed)
Patient ID: John Ware, male   DOB: [birth year], 55 y.o.   MRN: [id number] Vital signs are stable [place] neurologic condition the left lower extremity appears to remain stable.  Still has significant weakness proximally less so distally.  He is able to walk with the use of a walker.  I would like for physical therapy to evaluate him.  I have discussed situation with his wife who is concerned for his strength in his legs and possibility that the infection may have some progression.  I explained to her the rationale for surgery that it would not be curative but be mainly for decompression.  She appears to understand this of offered her reassurance regarding current course of action.  We will continue to follow him closely.

## 2021-05-31 NOTE — Plan of Care (Signed)
Pt is A&O x4. Pt continues to c/o chronic back/hip/leg pain and left sided weakness. Pt's pain in currently being manage with both scheduled and prn meds. No other complaints at this time.   Problem: Education: Goal: Knowledge of General Education information will improve Description: Including pain rating scale, medication(s)/side effects and non-pharmacologic comfort measures Outcome: Progressing   Problem: Health Behavior/Discharge Planning: Goal: Ability to manage health-related needs will improve Outcome: Progressing   Problem: Clinical Measurements: Goal: Ability to maintain clinical measurements within normal limits will improve Outcome: Progressing Goal: Will remain free from infection Outcome: Progressing Goal: Diagnostic test results will improve Outcome: Progressing Goal: Respiratory complications will improve Outcome: Progressing Goal: Cardiovascular complication will be avoided Outcome: Progressing   Problem: Activity: Goal: Risk for activity intolerance will decrease Outcome: Progressing   Problem: Nutrition: Goal: Adequate nutrition will be maintained Outcome: Progressing   Problem: Coping: Goal: Level of anxiety will decrease Outcome: Progressing   Problem: Elimination: Goal: Will not experience complications related to bowel motility Outcome: Progressing Goal: Will not experience complications related to urinary retention Outcome: Progressing   Problem: Pain Managment: Goal: General experience of comfort will improve Outcome: Progressing   Problem: Safety: Goal: Ability to remain free from injury will improve Outcome: Progressing   Problem: Skin Integrity: Goal: Risk for impaired skin integrity will decrease Outcome: Progressing

## 2021-05-31 NOTE — Progress Notes (Signed)
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  He completed course of IV antibiotics and was transitioned to oral antibiotic.  He presents with worsening back pain and focal neurologic deficit, weakness involving his left lower extremity.  Work-up revealed lumbar spine MRI with ongoing discitis/osteomyelitis at L1-L2 and L2-L3.  Epidural phlegmon along the posterior endplate centered at L1 with severe spinal canal stenosis with compression of cauda equina nerve roots.  Decreased size and number of bilateral psoas abscesses with small residual abscess bilaterally.  Diffuse signal abnormality throughout the psoas and paraspinal musculature consistent with inflammatory myositis.   Patient was transferred to [facility] for further neurosurgery evaluation.  Seen by infectious disease.  Repeated blood culture positive for MSSA 1 out of 2 bottles.    05/31/2021: Seen and examined at his bedside.  He had significant pain in his lower back.  IV pain medication last about 2 hours then wears off.    Assessment/Plan: Principal Problem:   Infection of spine (HCC) Active Problems:   Depression, recurrent (HCC)   Diabetes mellitus (HCC)   Diabetic polyneuropathy associated with type 2 diabetes mellitus (HCC)   HTN (hypertension)   Endocarditis   Psoas abscess (HCC)   L1-L2 and L2-L3 osteomyelitis, POA, complicated with compression of cauda equina nerve roots. Patient with persistent back pain and left lower extremity proximal weakness. Continue cefazolin as recommended by infectious disease Pain management in place with IV Dilaudid for severe pain, p.o. morphine oxycodone for moderate pain He is also on as needed gabapentin and as needed Robaxin CRP 5.1, on 05/29/2021 Sed rate 77 on 04/09/2021, sed rate 44 on  05/31/2021. Appreciate neurosurgery assistance.  MSSA bacteremia 1 out of 2 bottles positive for MSSA, collected on 05/28/2021. Will need to repeat blood cultures Currently on cefazolin Management per ID.  Left lower extremity weakness in the setting of osteomyelitis discitis at L1-2 and L2-3 with epidural phlegmon PT OT assessment Fall precautions Rest of management per neurosurgery   Type 2 diabetes mellitus with hyperglycemia.   Hemoglobin A1c 8.4 on 05/28/2021.   Hold off home oral hypoglycemics, metformin and empagliflozin.  Continue with insulin sliding scale for glucose coverage and monitoring.   Diabetic neuropathy.   Continue gabapentin and methocarbamol as needed.   Iron deficiency anemia Hgb is 12.0 and hct is 37.5  Iron studies done on 02/21/2021, showing iron deficiency. Hgb has been stable, patient not taking iron supplementation   HTN.   BP stable.   Continue to hold on antihypertensive medications to avoid hypotension.      Patient continue to be at high risk for worsening neurologic deficit.    Status is: Inpatient   Remains inpatient appropriate because:Inpatient level of care appropriate due to severity of illness   Dispo: The patient is from: Home              Anticipated d/c is to: Home              Patient currently is not medically stable to d/c.              Difficult to place patient No   DVT prophylaxis:      Scds   Code Status:               full  Family Communication:  No family at the bedside         Consultants:  ID Neurosurgery        Antimicrobials:  Cefazolin      Code Status: Full code  Family Communication: None at bedside  Disposition Plan: Possibly CIR or home with home health services once neurosurgery signs off.   Consultants: Neurosurgery  Procedures: None  Antimicrobials: Cefazolin  DVT prophylaxis: SCDs  Status is: Inpatient    Dispo: The patient is from: Home.              Anticipated d/c  is to: Possibly CIR              Patient currently medically not stable for discharge.   Difficult to place patient not applicable.        Objective: Vitals:   05/30/21 1500 05/30/21 2051 05/31/21 0751 05/31/21 1106  BP: (!) 139/93 (!) 123/94 (!) 142/86 129/87  Pulse: 90 86 83 81  Resp: 17 16 16 14   Temp: 98.5 F (36.9 C) 99.1 F (37.3 C) 98.1 F (36.7 C) 98.6 F (37 C)  TempSrc: Oral Oral Oral Oral  SpO2: 97% 98% 99% 92%  Weight:      Height:        Intake/Output Summary (Last 24 hours) at 05/31/2021 1306 Last data filed at 05/31/2021 0800 Gross per 24 hour  Intake 978.32 ml  Output 800 ml  Net 178.32 ml   Filed Weights   05/29/21 1756  Weight: 77.1 kg    Exam:  General: 55 y.o. year-old male well-developed well-nourished in no acute stress.  He is alert and oriented x3.   Cardiovascular: Regular rate and rhythm no rubs or gallops.   Respiratory: Clear to auscultation no wheezes or rales.   Abdomen: Soft nontender normal bowel sounds present. Musculoskeletal: No lower extremity edema bilaterally.   Skin: No ulcerative lesions noted. Psychiatry: Mood is appropriate for condition and setting.   Data Reviewed: CBC: Recent Labs  Lab 05/28/21 1353 05/28/21 1948 05/29/21 0404  WBC 10.4 6.8 6.9  NEUTROABS 6.9  --   --   HGB 12.8* 12.4* 12.0*  HCT 39.8 38.7* 37.5*  MCV 82.6 82.9 83.0  PLT 426* 411* 347   Basic Metabolic Panel: Recent Labs  Lab 05/28/21 1353 05/28/21 1948 05/29/21 0404  NA 142  --  136  K 4.1  --  4.2  CL 100  --  99  CO2 32  --  30  GLUCOSE 192*  --  211*  BUN 15  --  12  CREATININE 0.67 0.55* 0.59*  CALCIUM 10.2  --  9.4   GFR: Estimated Creatinine Clearance: 110.3 mL/min (A) (by C-G formula based on SCr of 0.59 mg/dL (L)). Liver Function Tests: Recent Labs  Lab 05/28/21 1353  AST 16  ALT 11  ALKPHOS 125  BILITOT 0.4  PROT 8.6*  ALBUMIN 3.6   No results for input(s): LIPASE, AMYLASE in the last 168 hours. No results  for input(s): AMMONIA in the last 168 hours. Coagulation Profile: No results for input(s): INR, PROTIME in the last 168 hours. Cardiac Enzymes: No results for input(s): CKTOTAL, CKMB, CKMBINDEX, TROPONINI in the last 168 hours. BNP (last 3 results) No results for input(s): PROBNP in the last 8760 hours. HbA1C: Recent Labs    05/28/21 1948  HGBA1C 8.4*   CBG: Recent Labs  Lab 05/30/21 1155 05/30/21 1712 05/30/21 2050 05/31/21 0644 05/31/21 1108  GLUCAP 173* 245* 201* 210* 232*  Lipid Profile: No results for input(s): CHOL, HDL, LDLCALC, TRIG, CHOLHDL, LDLDIRECT in the last 72 hours. Thyroid Function Tests: No results for input(s): TSH, T4TOTAL, FREET4, T3FREE, THYROIDAB in the last 72 hours. Anemia Panel: No results for input(s): VITAMINB12, FOLATE, FERRITIN, TIBC, IRON, RETICCTPCT in the last 72 hours. Urine analysis:    Component Value Date/Time   COLORURINE COLORLESS (A) 05/28/2021 1353   APPEARANCEUR CLEAR 05/28/2021 1353   LABSPEC 1.002 (L) 05/28/2021 1353   PHURINE 8.0 05/28/2021 1353   GLUCOSEU NEGATIVE 05/28/2021 1353   HGBUR MODERATE (A) 05/28/2021 1353   BILIRUBINUR NEGATIVE 05/28/2021 1353   KETONESUR NEGATIVE 05/28/2021 1353   PROTEINUR NEGATIVE 05/28/2021 1353   NITRITE NEGATIVE 05/28/2021 1353   LEUKOCYTESUR NEGATIVE 05/28/2021 1353   Sepsis Labs: @LABRCNTIP (procalcitonin:4,lacticidven:4)  ) Recent Results (from the past 240 hour(s))  Culture, blood (Routine X 2) w Reflex to ID Panel     Status: None (Preliminary result)   Collection Time: 05/28/21  5:20 PM   Specimen: BLOOD  Result Value Ref Range Status   Specimen Description   Final    BLOOD LEFT ANTECUBITAL Performed at [facility], [address], [place], [place]    Special Requests   Final    BOTTLES DRAWN AEROBIC AND ANAEROBIC Blood Culture adequate volume Performed at [facility], [address], [place], [place]    Culture    Final    NO GROWTH 3 DAYS Performed at [facility], [address] [place], [place]    Report Status PENDING  Incomplete  Culture, blood (Routine X 2) w Reflex to ID Panel     Status: None (Preliminary result)   Collection Time: 05/28/21  5:22 PM   Specimen: BLOOD  Result Value Ref Range Status   Specimen Description   Final    BLOOD RIGHT ANTECUBITAL Performed at [facility], [address] [place], [place]    Special Requests   Final    BOTTLES DRAWN AEROBIC AND ANAEROBIC Blood Culture results may not be optimal due to an excessive volume of blood received in culture bottles Performed at [facility], [address], [place], [place]    Culture  Setup Time   Final    GRAM POSITIVE COCCI ANAEROBIC BOTTLE ONLY CRITICAL RESULT CALLED TO, READ BACK BY AND VERIFIED WITH: PHARMD GREG ABBOTT 05/31/2021@2 :56 BY TW Performed at [facility], [address] [place], [place]    Culture GRAM POSITIVE COCCI  Final   Report Status PENDING  Incomplete  Blood Culture ID Panel (Reflexed)     Status: Abnormal   Collection Time: 05/28/21  5:22 PM  Result Value Ref Range Status   Enterococcus faecalis NOT DETECTED NOT DETECTED Final   Enterococcus Faecium NOT DETECTED NOT DETECTED Final   Listeria monocytogenes NOT DETECTED NOT DETECTED Final   Staphylococcus species DETECTED (A) NOT DETECTED Final    Comment: CRITICAL RESULT CALLED TO, READ BACK BY AND VERIFIED WITH: PHARMD GREG ABBOTT 05/31/2021@2 :56 BY TW    Staphylococcus aureus (BCID) DETECTED (A) NOT DETECTED Final    Comment: CRITICAL RESULT CALLED TO, READ BACK BY AND VERIFIED WITH: PHARMD GREG ABBOTT 05/31/2021@2 :56 BY TW    Staphylococcus epidermidis NOT DETECTED NOT DETECTED Final   Staphylococcus lugdunensis NOT DETECTED NOT DETECTED Final   Streptococcus species NOT DETECTED NOT DETECTED Final   Streptococcus agalactiae NOT DETECTED NOT  DETECTED Final  Streptococcus pneumoniae NOT DETECTED NOT DETECTED Final   Streptococcus pyogenes NOT DETECTED NOT DETECTED Final   A.calcoaceticus-baumannii NOT DETECTED NOT DETECTED Final   Bacteroides fragilis NOT DETECTED NOT DETECTED Final   Enterobacterales NOT DETECTED NOT DETECTED Final   Enterobacter cloacae complex NOT DETECTED NOT DETECTED Final   Escherichia coli NOT DETECTED NOT DETECTED Final   Klebsiella aerogenes NOT DETECTED NOT DETECTED Final   Klebsiella oxytoca NOT DETECTED NOT DETECTED Final   Klebsiella pneumoniae NOT DETECTED NOT DETECTED Final   Proteus species NOT DETECTED NOT DETECTED Final   Salmonella species NOT DETECTED NOT DETECTED Final   Serratia marcescens NOT DETECTED NOT DETECTED Final   Haemophilus influenzae NOT DETECTED NOT DETECTED Final   Neisseria meningitidis NOT DETECTED NOT DETECTED Final   Pseudomonas aeruginosa NOT DETECTED NOT DETECTED Final   Stenotrophomonas maltophilia NOT DETECTED NOT DETECTED Final   Candida albicans NOT DETECTED NOT DETECTED Final   Candida auris NOT DETECTED NOT DETECTED Final   Candida glabrata NOT DETECTED NOT DETECTED Final   Candida krusei NOT DETECTED NOT DETECTED Final   Candida parapsilosis NOT DETECTED NOT DETECTED Final   Candida tropicalis NOT DETECTED NOT DETECTED Final   Cryptococcus neoformans/gattii NOT DETECTED NOT DETECTED Final   Meth resistant mecA/C and MREJ NOT DETECTED NOT DETECTED Final    Comment: Performed at [facility], [address] [place], [place] [address]  Resp Panel by RT-PCR (Flu A&B, Covid) Nasopharyngeal Swab     Status: None   Collection Time: 05/28/21  5:30 PM   Specimen: Nasopharyngeal Swab; Nasopharyngeal(NP) swabs in vial transport medium  Result Value Ref Range Status   SARS Coronavirus 2 by RT PCR NEGATIVE NEGATIVE Final    Comment: (NOTE) SARS-CoV-2 target nucleic acids are NOT DETECTED.  The SARS-CoV-2 RNA is generally detectable in upper  respiratory specimens during the acute phase of infection. The lowest concentration of SARS-CoV-2 viral copies this assay can detect is 138 copies/mL. A negative result does not preclude SARS-Cov-2 infection and should not be used as the sole basis for treatment or other patient management decisions. A negative result may occur with  improper specimen collection/handling, submission of specimen other than nasopharyngeal swab, presence of viral mutation(s) within the areas targeted by this assay, and inadequate number of viral copies(<138 copies/mL). A negative result must be combined with clinical observations, patient history, and epidemiological information. The expected result is Negative.  Fact Sheet for Patients:  BloggerCourse.com  Fact Sheet for Healthcare Providers:  SeriousBroker.it  This test is no t yet approved or cleared by the [place] FDA and  has been authorized for detection and/or diagnosis of SARS-CoV-2 by FDA under an Emergency Use Authorization (EUA). This EUA will remain  in effect (meaning this test can be used) for the duration of the COVID-19 declaration under Section 564(b)(1) of the Act, 21 U.S.C.section 360bbb-3(b)(1), unless the authorization is terminated  or revoked sooner.       Influenza A by PCR NEGATIVE NEGATIVE Final   Influenza B by PCR NEGATIVE NEGATIVE Final    Comment: (NOTE) The Xpert Xpress SARS-CoV-2/FLU/RSV plus assay is intended as an aid in the diagnosis of influenza from Nasopharyngeal swab specimens and should not be used as a sole basis for treatment. Nasal washings and aspirates are unacceptable for Xpert Xpress SARS-CoV-2/FLU/RSV testing.  Fact Sheet for Patients: BloggerCourse.com  Fact Sheet for Healthcare Providers: SeriousBroker.it  This test is not yet approved or cleared by the [place] and  has been  authorized for detection and/or diagnosis of SARS-CoV-2 by FDA under an Emergency Use Authorization (EUA). This EUA will remain in effect (meaning this test can be used) for the duration of the COVID-19 declaration under Section 564(b)(1) of the Act, 21 U.S.C. section 360bbb-3(b)(1), unless the authorization is terminated or revoked.  Performed at [facility], [address] [place], [place] [address]       Studies: No results found.  Scheduled Meds:  feeding supplement (GLUCERNA SHAKE)  237 mL Oral BID BM   insulin aspart  0-9 Units Subcutaneous TID WC   morphine  30 mg Oral BID   multivitamin with minerals  1 tablet Oral Daily   Ensure Max Protein  11 oz Oral QHS   senna-docusate  2 tablet Oral Daily    Continuous Infusions:   ceFAZolin (ANCEF) IV 2 g (05/31/21 0859)     LOS: 3 days     Darlin Drop, MD [facility] Pager [phone]  If 7PM-7AM, please contact night-coverage [email or web address] Password [email or web address] 05/31/2021, 1:06 PM

## 2021-06-01 ENCOUNTER — Inpatient Hospital Stay (HOSPITAL_COMMUNITY): Payer: Commercial Managed Care - PPO

## 2021-06-01 DIAGNOSIS — M4620 Osteomyelitis of vertebra, site unspecified: Secondary | ICD-10-CM | POA: Diagnosis not present

## 2021-06-01 LAB — ECHOCARDIOGRAM COMPLETE
AR max vel: 2.87 cm2
AV Area VTI: 3.37 cm2
AV Area mean vel: 2.82 cm2
AV Mean grad: 3 mmHg
AV Peak grad: 5.4 mmHg
Ao pk vel: 1.16 m/s
Area-P 1/2: 2.65 cm2
Height: 70 in
MV VTI: 2.77 cm2
S' Lateral: 3 cm
Weight: 2720 oz

## 2021-06-01 LAB — CBC
HCT: 39.3 % (ref 39.0–52.0)
Hemoglobin: 12.8 g/dL — ABNORMAL LOW (ref 13.0–17.0)
MCH: 26.4 pg (ref 26.0–34.0)
MCHC: 32.6 g/dL (ref 30.0–36.0)
MCV: 81.2 fL (ref 80.0–100.0)
Platelets: 389 10*3/uL (ref 150–400)
RBC: 4.84 MIL/uL (ref 4.22–5.81)
RDW: 16.4 % — ABNORMAL HIGH (ref 11.5–15.5)
WBC: 8.6 10*3/uL (ref 4.0–10.5)
nRBC: 0 % (ref 0.0–0.2)

## 2021-06-01 LAB — COMPREHENSIVE METABOLIC PANEL
ALT: 11 U/L (ref 0–44)
AST: 18 U/L (ref 15–41)
Albumin: 2.9 g/dL — ABNORMAL LOW (ref 3.5–5.0)
Alkaline Phosphatase: 106 U/L (ref 38–126)
Anion gap: 11 (ref 5–15)
BUN: 12 mg/dL (ref 6–20)
CO2: 26 mmol/L (ref 22–32)
Calcium: 9.4 mg/dL (ref 8.9–10.3)
Chloride: 95 mmol/L — ABNORMAL LOW (ref 98–111)
Creatinine, Ser: 0.52 mg/dL — ABNORMAL LOW (ref 0.61–1.24)
GFR, Estimated: >60 mL/min (ref >60)
Glucose, Bld: 179 mg/dL — ABNORMAL HIGH (ref 70–99)
Potassium: 4.5 mmol/L (ref 3.5–5.1)
Sodium: 132 mmol/L — ABNORMAL LOW (ref 135–145)
Total Bilirubin: 0.6 mg/dL (ref 0.3–1.2)
Total Protein: 7.3 g/dL (ref 6.5–8.1)

## 2021-06-01 LAB — GLUCOSE, CAPILLARY
Glucose-Capillary: 193 mg/dL — ABNORMAL HIGH (ref 70–99)
Glucose-Capillary: 248 mg/dL — ABNORMAL HIGH (ref 70–99)
Glucose-Capillary: 171 mg/dL — ABNORMAL HIGH (ref 70–99)
Glucose-Capillary: 189 mg/dL — ABNORMAL HIGH (ref 70–99)

## 2021-06-01 LAB — PHOSPHORUS: Phosphorus: 3.9 mg/dL (ref 2.5–4.6)

## 2021-06-01 LAB — MAGNESIUM: Magnesium: 1.7 mg/dL (ref 1.7–2.4)

## 2021-06-01 NOTE — Progress Notes (Signed)
Patient ID: John Ware, male   DOB: 10/03/1965, 54 y.o.   MRN: [id number] Vital signs are stable Patient notes that back pain is still problematic Leg strength appears about the same in the left lower extremity with 2-3 out of 5 iliopsoas and quad strength.  Dorsi and plantar flexor strength is good.  I will stop back on Monday and reevaluate him continues IV antibiotics.

## 2021-06-01 NOTE — Progress Notes (Signed)
[facility]  Date of Admission:  05/28/2021      Total days of antibiotics 5  Cefazolin 10/03 >> c         ASSESSMENT: John Ware is a 55 y.o. 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 Decrease in b/l psoas muscle abscesses but not resolved. L4-5 fluid collection persists and is unchanged in size compared to prior study in July.   Lumbar Osteomyelitis L1-2, L2-3 with new epidural phlegmon - Dr. August following; no surgical indications at this time. Working with PT/OT for weakness. There is a mention of a fluid collection that is unchanged since July - given he failed suppressive antibiotics we may need to consider this for IR drainage as it is presumably infection.   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    PLAN: ?if the persistent L4-5 fluid collection should be considered for IR drainage as ongoing nidus of infection  Will check TTE to follow up known h/o TV IE.  Hold on PICC now - probably place Monday if BCx clear Follow blood cultures for clearance 10/07    Principal Problem:   Infection of spine (HCC) Active Problems:   Depression, recurrent (HCC)   Diabetes mellitus (HCC)   Diabetic polyneuropathy associated with type 2 diabetes mellitus (HCC)   HTN (hypertension)   Endocarditis   Psoas abscess (HCC)    feeding supplement (GLUCERNA SHAKE)  237 mL Oral BID BM   insulin aspart  0-9 Units Subcutaneous TID WC   morphine  30 mg Oral BID   multivitamin with minerals  1 tablet Oral Daily   Ensure Max Protein  11 oz Oral QHS   senna-docusate  2 tablet Oral Daily    SUBJECTIVE: Wishes he could go home. No new complaints today do declare. LLE  continues to be weaker than the right but no worse vs initial presentation.    Review of Systems: Review of Systems  Constitutional:  Negative for chills and fever.  HENT:  Negative for tinnitus.   Eyes:  Negative for blurred vision and photophobia.  Respiratory:  Negative for cough and sputum production.   Cardiovascular:  Negative for chest pain.  Gastrointestinal:  Negative for diarrhea, nausea and vomiting.  Genitourinary:  Negative for dysuria.  Musculoskeletal:  Positive for back pain.  Skin:  Negative for rash.  Neurological:  Positive for focal weakness. Negative for headaches.   Allergies  Allergen Reactions   Tizanidine Other (See Comments)    Leg cramps    OBJECTIVE: Vitals:   05/31/21 1106 05/31/21 1455 05/31/21 2122 06/01/21 0841  BP: 129/87 111/86 132/89 (!) 141/95  Pulse: 81 76 84 92  Resp: 14 14 16 17   Temp: 98.6 F (37 C) 98.6 F (37 C) 98.9 F (37.2 C) 97.8 F (36.6 C)  TempSrc: Oral Oral Oral Oral  SpO2: 92% 98% 98% 100%  Weight:      Height:       Body mass index is 24.39 kg/m.  Physical Exam Vitals reviewed.  Constitutional:      Appearance: Normal appearance. He is well-developed. He is not ill-appearing.     Comments: Resting comfortably in bed.   HENT:  Head: Normocephalic.     Mouth/Throat:     Mouth: Mucous membranes are moist.     Dentition: Normal dentition. No dental abscesses.     Pharynx: Oropharynx is clear.  Eyes:     General: No scleral icterus. Cardiovascular:     Rate and Rhythm: Normal rate and regular rhythm.     Heart sounds: Normal heart sounds.  Pulmonary:     Effort: Pulmonary effort is normal.     Breath sounds: Normal breath sounds.  Abdominal:     General: There is no distension.     Palpations: Abdomen is soft.     Tenderness: There is no abdominal tenderness.  Musculoskeletal:        General: Normal range of motion.     Cervical back: Normal range of motion.  Lymphadenopathy:     Cervical: No  cervical adenopathy.  Skin:    General: Skin is warm and dry.     Coloration: Skin is not jaundiced or pale.     Findings: No rash.  Neurological:     Mental Status: He is alert and oriented to person, place, and time.  Psychiatric:        Mood and Affect: Mood normal.        Judgment: Judgment normal.    Lab Results Lab Results  Component Value Date   WBC 8.6 06/01/2021   HGB 12.8 (L) 06/01/2021   HCT 39.3 06/01/2021   MCV 81.2 06/01/2021   PLT 389 06/01/2021    Lab Results  Component Value Date   CREATININE 0.52 (L) 06/01/2021   BUN 12 06/01/2021   NA 132 (L) 06/01/2021   K 4.5 06/01/2021   CL 95 (L) 06/01/2021   CO2 26 06/01/2021    Lab Results  Component Value Date   ALT 11 06/01/2021   AST 18 06/01/2021   ALKPHOS 106 06/01/2021   BILITOT 0.6 06/01/2021     Microbiology: Recent Results (from the past 240 hour(s))  Culture, blood (Routine X 2) w Reflex to ID Panel     Status: None (Preliminary result)   Collection Time: 05/28/21  5:20 PM   Specimen: BLOOD  Result Value Ref Range Status   Specimen Description   Final    BLOOD LEFT ANTECUBITAL Performed at [facility], [address], [place], [place] [address]    Special Requests   Final    BOTTLES DRAWN AEROBIC AND ANAEROBIC Blood Culture adequate volume Performed at [facility], [address], [place], [place] [address]    Culture   Final    NO GROWTH 4 DAYS Performed at [facility], [address] [place], [place] [address]    Report Status PENDING  Incomplete  Culture, blood (Routine X 2) w Reflex to ID Panel     Status: Abnormal (Preliminary result)   Collection Time: 05/28/21  5:22 PM   Specimen: BLOOD  Result Value Ref Range Status   Specimen Description   Final    BLOOD RIGHT ANTECUBITAL Performed at [facility], [address] [place], [place] [address]    Special Requests   Final    BOTTLES DRAWN AEROBIC AND ANAEROBIC  Blood Culture results may not be optimal due to an excessive volume of blood received in culture bottles Performed at [facility], [address] [place], [place] [address]    Culture  Setup Time   Final    GRAM POSITIVE COCCI ANAEROBIC  BOTTLE ONLY CRITICAL RESULT CALLED TO, READ BACK BY AND VERIFIED WITH: PHARMD GREG ABBOTT 05/31/2021@2 :56 BY TW    Culture (A)  Final    STAPHYLOCOCCUS AUREUS SUSCEPTIBILITIES TO FOLLOW Performed at [facility], [address], [place], [place] [address]    Report Status PENDING  Incomplete  Blood Culture ID Panel (Reflexed)     Status: Abnormal   Collection Time: 05/28/21  5:22 PM  Result Value Ref Range Status   Enterococcus faecalis NOT DETECTED NOT DETECTED Final   Enterococcus Faecium NOT DETECTED NOT DETECTED Final   Listeria monocytogenes NOT DETECTED NOT DETECTED Final   Staphylococcus species DETECTED (A) NOT DETECTED Final    Comment: CRITICAL RESULT CALLED TO, READ BACK BY AND VERIFIED WITH: PHARMD GREG ABBOTT 05/31/2021@2 :56 BY TW    Staphylococcus aureus (BCID) DETECTED (A) NOT DETECTED Final    Comment: CRITICAL RESULT CALLED TO, READ BACK BY AND VERIFIED WITH: PHARMD GREG ABBOTT 05/31/2021@2 :56 BY TW    Staphylococcus epidermidis NOT DETECTED NOT DETECTED Final   Staphylococcus lugdunensis NOT DETECTED NOT DETECTED Final   Streptococcus species NOT DETECTED NOT DETECTED Final   Streptococcus agalactiae NOT DETECTED NOT DETECTED Final   Streptococcus pneumoniae NOT DETECTED NOT DETECTED Final   Streptococcus pyogenes NOT DETECTED NOT DETECTED Final   A.calcoaceticus-baumannii NOT DETECTED NOT DETECTED Final   Bacteroides fragilis NOT DETECTED NOT DETECTED Final   Enterobacterales NOT DETECTED NOT DETECTED Final   Enterobacter cloacae complex NOT DETECTED NOT DETECTED Final   Escherichia coli NOT DETECTED NOT DETECTED Final   Klebsiella aerogenes NOT DETECTED NOT DETECTED Final   Klebsiella oxytoca NOT  DETECTED NOT DETECTED Final   Klebsiella pneumoniae NOT DETECTED NOT DETECTED Final   Proteus species NOT DETECTED NOT DETECTED Final   Salmonella species NOT DETECTED NOT DETECTED Final   Serratia marcescens NOT DETECTED NOT DETECTED Final   Haemophilus influenzae NOT DETECTED NOT DETECTED Final   Neisseria meningitidis NOT DETECTED NOT DETECTED Final   Pseudomonas aeruginosa NOT DETECTED NOT DETECTED Final   Stenotrophomonas maltophilia NOT DETECTED NOT DETECTED Final   Candida albicans NOT DETECTED NOT DETECTED Final   Candida auris NOT DETECTED NOT DETECTED Final   Candida glabrata NOT DETECTED NOT DETECTED Final   Candida krusei NOT DETECTED NOT DETECTED Final   Candida parapsilosis NOT DETECTED NOT DETECTED Final   Candida tropicalis NOT DETECTED NOT DETECTED Final   Cryptococcus neoformans/gattii NOT DETECTED NOT DETECTED Final   Meth resistant mecA/C and MREJ NOT DETECTED NOT DETECTED Final    Comment: Performed at [facility], [address] [place], [place] [address]  Resp Panel by RT-PCR (Flu A&B, Covid) Nasopharyngeal Swab     Status: None   Collection Time: 05/28/21  5:30 PM   Specimen: Nasopharyngeal Swab; Nasopharyngeal(NP) swabs in vial transport medium  Result Value Ref Range Status   SARS Coronavirus 2 by RT PCR NEGATIVE NEGATIVE Final    Comment: (NOTE) SARS-CoV-2 target nucleic acids are NOT DETECTED.  The SARS-CoV-2 RNA is generally detectable in upper respiratory specimens during the acute phase of infection. The lowest concentration of SARS-CoV-2 viral copies this assay can detect is 138 copies/mL. A negative result does not preclude SARS-Cov-2 infection and should not be used as the sole basis for treatment or other patient management decisions. A negative result may occur with  improper specimen collection/handling, submission of specimen other than nasopharyngeal swab, presence of viral mutation(s) within the areas targeted by this assay, and  inadequate number  of viral copies(<138 copies/mL). A negative result must be combined with clinical observations, patient history, and epidemiological information. The expected result is Negative.  Fact Sheet for Patients:  BloggerCourse.com  Fact Sheet for Healthcare Providers:  SeriousBroker.it  This test is no t yet approved or cleared by the [place] FDA and  has been authorized for detection and/or diagnosis of SARS-CoV-2 by FDA under an Emergency Use Authorization (EUA). This EUA will remain  in effect (meaning this test can be used) for the duration of the COVID-19 declaration under Section 564(b)(1) of the Act, 21 U.S.C.section 360bbb-3(b)(1), unless the authorization is terminated  or revoked sooner.       Influenza A by PCR NEGATIVE NEGATIVE Final   Influenza B by PCR NEGATIVE NEGATIVE Final    Comment: (NOTE) The Xpert Xpress SARS-CoV-2/FLU/RSV plus assay is intended as an aid in the diagnosis of influenza from Nasopharyngeal swab specimens and should not be used as a sole basis for treatment. Nasal washings and aspirates are unacceptable for Xpert Xpress SARS-CoV-2/FLU/RSV testing.  Fact Sheet for Patients: BloggerCourse.com  Fact Sheet for Healthcare Providers: SeriousBroker.it  This test is not yet approved or cleared by the [place] FDA and has been authorized for detection and/or diagnosis of SARS-CoV-2 by FDA under an Emergency Use Authorization (EUA). This EUA will remain in effect (meaning this test can be used) for the duration of the COVID-19 declaration under Section 564(b)(1) of the Act, 21 U.S.C. section 360bbb-3(b)(1), unless the authorization is terminated or revoked.  Performed at [facility], [address], [place], [place] [address]   Culture, blood (routine x 2)     Status: None (Preliminary result)   Collection  Time: 06/01/21  6:02 AM   Specimen: BLOOD RIGHT HAND  Result Value Ref Range Status   Specimen Description BLOOD RIGHT HAND  Final   Special Requests   Final    AEROBIC BOTTLE ONLY Blood Culture results may not be optimal due to an inadequate volume of blood received in culture bottles   Culture   Final    NO GROWTH < 12 HOURS Performed at [facility], [address], [place], [place] [address]    Report Status PENDING  Incomplete  Culture, blood (routine x 2)     Status: None (Preliminary result)   Collection Time: 06/01/21  6:02 AM   Specimen: BLOOD LEFT HAND  Result Value Ref Range Status   Specimen Description BLOOD LEFT HAND  Final   Special Requests   Final    AEROBIC BOTTLE ONLY Blood Culture results may not be optimal due to an inadequate volume of blood received in culture bottles   Culture   Final    NO GROWTH < 12 HOURS Performed at [facility], [address] [place], [place] [address]    Report Status PENDING  Incomplete    Rexene Alberts, MSN, NP-C [facility] Pager: [phone]  06/01/2021  12:35 PM

## 2021-06-01 NOTE — Evaluation (Signed)
Occupational Therapy Evaluation Patient Details Name: John Ware MRN: [id number] DOB: [birth year] Today's Date: 06/01/2021   History of Present Illness Patient is a 55 y/o male who presents on 05/28/21 with LLE weakness and pain. 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 PMH includes HTN, chronic pain, DM, COVID, MSSA bacteremia, depression.   Clinical Impression   PTA, pt was living with his wife and was independent. Pt currently requiring Min-Mod A for LB ADLs and Min Guard-Min A for functional mobility with RW.  Presenting with decreased ROM, balance, and strength due to pain. Pt would benefit from further acute OT to facilitate safe dc. Recommend dc to home once medically stable per physician.       Recommendations for follow up therapy are one component of a multi-disciplinary discharge planning process, led by the attending physician.  Recommendations may be updated based on patient status, additional functional criteria and insurance authorization.   Follow Up Recommendations  No OT follow up    Equipment Recommendations  None recommended by OT    Recommendations for Other Services PT consult     Precautions / Restrictions Precautions Precautions: Fall;Back (Back for comfort) Precaution Booklet Issued: No Precaution Comments: Use of log roll for comfort      Mobility Bed Mobility Overal bed mobility: Needs Assistance Bed Mobility: Sidelying to Sit;Rolling Rolling: Min guard Sidelying to sit: Min guard;HOB elevated       General bed mobility comments: Min Guard A for safety. cues for log roll    Transfers Overall transfer level: Needs assistance Equipment used: Rolling walker (2 wheeled) Transfers: Sit to/from Stand Sit to Stand: Min guard          General transfer comment: min guard A for safety    Balance Overall balance assessment: Needs assistance Sitting-balance support: No upper extremity supported;Feet supported Sitting balance-[name]: Fair     Standing balance support: Bilateral upper extremity supported;During functional activity Standing balance-[name]: Poor                             ADL either performed or assessed with clinical judgement   ADL Overall ADL's : Needs assistance/impaired Eating/Feeding: Set up;Sitting   Grooming: Set up;Sitting   Upper Body Bathing: Supervision/ safety;Set up;Sitting   Lower Body Bathing: Minimal assistance;Sit to/from stand   Upper Body Dressing : Supervision/safety;Set up Upper Body Dressing Details (indicate cue type and reason): Limited ROM due to prior shoudler sx Lower Body Dressing: Moderate assistance;Sit to/from stand Lower Body Dressing Details (indicate cue type and reason): Mod A for LB dressing due to limited ROM with pain Toilet Transfer: Min guard;Ambulation;RW (simulated to recliner)           Functional mobility during ADLs: Min guard;Rolling walker General ADL Comments: Pt with decreased ROM and strength due to pain. Motivated despite pain     Vision Baseline Vision/History: 1 Wears glasses Patient Visual Report: No change from baseline       Perception     Praxis      Pertinent Vitals/Pain Pain Assessment: 0-10 Pain Score: 6  Pain Location: bil hips Pain Descriptors / Indicators: Constant;Discomfort Pain Intervention(s): Monitored during session;Limited activity within patient's tolerance;Repositioned     Hand Dominance Left   Extremity/Trunk  Assessment Upper Extremity Assessment Upper Extremity Assessment: Overall WFL for tasks assessed   Lower Extremity Assessment Lower Extremity Assessment: Defer to PT evaluation   Cervical / Trunk Assessment Cervical / Trunk Assessment: Other exceptions Cervical / Trunk  Exceptions: lumbar infection   Communication Communication Communication: No difficulties   Cognition Arousal/Alertness: Awake/alert Behavior During Therapy: WFL for tasks assessed/performed Overall Cognitive Status: Within Functional Limits for tasks assessed                                     General Comments       Exercises Exercises: Other exercises Other Exercises Other Exercises: Pushing LLE out and back while foot rests on towel. x10. seated   Shoulder Instructions      Home Living Family/patient expects to be discharged to:: Private residence Living Arrangements: Spouse/significant other Available Help at Discharge: Family Type of Home: House Home Access: Level entry     Home Layout: One level     Bathroom Shower/Tub: Chief Strategy Officer: Handicapped height     Home Equipment: Emergency planning/management officer - 2 wheels;Cane - single point          Prior Functioning/Environment Level of Independence: Independent        Comments: ADLs, IADLs, retired. Using cane and RW once the pain in LLE  and bil hips started        OT Problem List: Decreased strength;Decreased range of motion;Decreased activity tolerance;Impaired balance (sitting and/or standing);Decreased safety awareness;Decreased knowledge of precautions;Decreased knowledge of use of DME or AE;Pain      OT Treatment/Interventions: Self-care/ADL training;Therapeutic exercise;Energy conservation;DME and/or AE instruction;Therapeutic activities;Patient/family education    OT Goals(Current goals can be found in the care plan section) Acute Rehab OT Goals Patient Stated Goal: Reduce pain OT Goal Formulation: With patient Time For Goal Achievement: 06/15/21 Potential to Achieve Goals: Good  OT Frequency: Min 2X/week   Barriers to D/C:            Co-evaluation              AM-PAC OT "6 Clicks" Daily Activity     Outcome Measure Help from another person eating meals?:  None Help from another person taking care of personal grooming?: A Little Help from another person toileting, which includes using toliet, bedpan, or urinal?: A Little Help from another person bathing (including washing, rinsing, drying)?: A Little Help from another person to put on and taking off regular upper body clothing?: A Little Help from another person to put on and taking off regular lower body clothing?: A Lot 6 Click Score: 18   End of Session Equipment Utilized During Treatment: Rolling walker;Gait belt Nurse Communication: Mobility status  Activity Tolerance: Patient tolerated treatment well Patient left: in chair;with call bell/phone within reach  OT Visit Diagnosis: Unsteadiness on feet (R26.81);Other abnormalities of gait and mobility (R26.89);Muscle weakness (generalized) (M62.81);Pain Pain - Right/Left: Left Pain - part of body: Hip                Time: 0820-0832 OT Time Calculation (min): 12 min Charges:  OT General Charges $OT Visit: 1 Visit OT Evaluation $OT Eval Low Complexity: 1 Low  John Ware MSOT, OTR/L Acute Rehab Pager: [phone] Office: [phone]  John Ware 06/01/2021, 8:57 AM

## 2021-06-01 NOTE — Evaluation (Addendum)
Physical Therapy Evaluation Patient Details Name: John Ware MRN: [id number] DOB: Aug 31, 1965 Today's Date: 06/01/2021  History of Present Illness  Patient is a 55 y/o male who presents on 05/28/21 with LLE weakness and pain. 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 PMH includes HTN, chronic pain, DM, COVID, MSSA bacteremia, depression.  Clinical Impression  Patient presents with pain, decreased AROM/strength of LLE, decreased muscular endurance, decreased power and impaired mobility s/p above. Pt lives at home with wife and reports being independent for ADLs and ambulation until last week where pt has needed RW vs SPC for support. Today, pt tolerated transfers and gait training with Min guard assist and use of RW for support. Encouraged use of RW and not SPC and walking to bathroom as able. Instructed pt in functional strengthening exercise to perform throughout the day. Hoping pt will progress with antibiotics. Will follow acutely to maximize independence and mobility prior to return home.       Recommendations for follow up therapy are one component of a multi-disciplinary discharge planning process, led by the attending physician.  Recommendations may be updated based on patient status, additional functional criteria and insurance authorization.  Follow Up Recommendations No PT follow up;Supervision - Intermittent (pending improvement)    Equipment Recommendations  None recommended by PT    Recommendations for Other Services       Precautions / Restrictions Precautions Precautions: Fall;Back Precaution Booklet Issued: No Precaution Comments: Use of log roll for comfort Restrictions Weight Bearing Restrictions: No      Mobility  Bed Mobility Overal bed mobility:  Needs Assistance Bed Mobility: Rolling;Sidelying to Sit;Sit to Sidelying Rolling: Supervision Sidelying to sit: Supervision;HOB elevated     Sit to sidelying: Supervision;HOB elevated General bed mobility comments: Good demo of log roll technique and use of rail for support. Needs to manually move LLE with UEs.    Transfers Overall transfer level: Needs assistance Equipment used: Rolling walker (2 wheeled) Transfers: Sit to/from Stand Sit to Stand: Min guard         General transfer comment: min guard A for safety; favors RLE upon standing, cues for emphasis on BLEs equally during transitions for strengthening.  Ambulation/Gait Ambulation/Gait assistance: Min guard Gait Distance (Feet): 35 Feet Assistive device: Rolling walker (2 wheeled) Gait Pattern/deviations: Step-through pattern;Decreased stride length;Trunk flexed Gait velocity: decreased Gait velocity interpretation: <1.31 ft/sec, indicative of household ambulator General Gait Details: Slow, mostly steady gait with difficulty advancing LLE due to weakness; cues to keep RW on ground and not pick it up. Left knee instability, fatigues quickly.  Stairs            Wheelchair Mobility    Modified Rankin (Stroke Patients Only)       Balance Overall balance assessment: Needs assistance Sitting-balance support: No upper extremity supported;Feet supported Sitting balance-[name]: Good     Standing balance support: During functional activity Standing balance-[name]: Poor Standing balance comment: Requires Ue support in standing.                             Pertinent Vitals/Pain Pain Assessment: Faces Faces Pain Scale: Hurts even more Pain Location: left hip/back Pain Descriptors / Indicators: Constant;Discomfort;Sore;Guarding Pain Intervention(s): Monitored during session;Repositioned;Premedicated before session  Home Living Family/patient expects to be discharged to:: Private  residence Living Arrangements: Spouse/significant other Available Help at Discharge: Family Type of Home: House Home Access: Level entry     Home Layout: One level Home Equipment: Shower seat;Cane - single point;Walker - 4 wheels      Prior Function Level of Independence: Independent         Comments: ADLs, IADLs, retired. Using cane and RW once the pain in LLE  and bil hips started     Hand Dominance   Dominant Hand: Left    Extremity/Trunk Assessment   Upper Extremity Assessment Upper Extremity Assessment: Defer to OT evaluation    Lower Extremity Assessment Lower Extremity Assessment: LLE deficits/detail LLE Deficits / Details: Grossly ~2-/5 hip flexion, 3/5 knee flexion, 2-/5 knee extension, 5/5 DF/PF LLE Sensation: decreased light touch LLE Coordination: decreased fine motor;decreased gross motor    Cervical / Trunk Assessment Cervical / Trunk Assessment: Other exceptions Cervical / Trunk Exceptions: lumbar infection  Communication   Communication: No difficulties  Cognition Arousal/Alertness: Awake/alert Behavior During Therapy: WFL for tasks assessed/performed Overall Cognitive Status: Within Functional Limits for tasks assessed                                        General Comments General comments (skin integrity, edema, etc.): Wife present during session. Concerned about not getting pain meds as needed and on time.    Exercises     Assessment/Plan    PT Assessment Patient needs continued PT services  PT Problem List Decreased strength;Decreased mobility;Pain;Impaired sensation;Decreased balance;Decreased activity tolerance;Decreased range of motion       PT Treatment Interventions Therapeutic exercise;Patient/family education;Therapeutic activities;Functional mobility training;Balance training;Gait training;DME instruction    PT Goals (Current goals can be found in the Care Plan section)  Acute Rehab PT Goals Patient Stated  Goal: Reduce pain PT Goal Formulation: With patient Time For Goal Achievement: 06/15/21 Potential to Achieve Goals: Good    Frequency Min 3X/week   Barriers to discharge        Co-evaluation               AM-PAC PT "6 Clicks" Mobility  Outcome Measure Help needed turning from your back to your side while in a flat bed without using bedrails?: A Little Help needed moving from lying on your back to sitting on the side of a flat bed without using bedrails?: A Little Help needed moving to and from a bed to a chair (including a wheelchair)?: A Little Help needed standing up from a chair using your arms (e.g., wheelchair or bedside chair)?: A Little Help needed to walk in hospital room?: A Little Help needed climbing 3-5 steps with a railing? : A Little 6 Click Score: 18    End of Session Equipment Utilized During Treatment: Gait belt Activity Tolerance: Patient limited by fatigue Patient left: in bed;with call bell/phone within reach;Other (comment);with family/visitor present (with ECHO tech) Nurse Communication: Mobility status PT Visit Diagnosis: Pain Pain - Right/Left: Left Pain - part of body:  (back/hip)    Time: 7628-3151 PT Time Calculation (min) (ACUTE ONLY): 17 min   Charges:   PT Evaluation $PT Eval Moderate Complexity: 1 Mod          Vale Haven, PT, DPT Acute Rehabilitation Services Pager [phone] Office [phone]     Blake Divine A Lanier Ensign 06/01/2021, 2:46 PM

## 2021-06-01 NOTE — Progress Notes (Addendum)
PROGRESS NOTE  John Ware CNO:[id number] DOB: 1966-06-11 DOA: 05/28/2021 PCP: Junie Spencer, FNP  HPI/Recap of past 9 hours: 55 year old male with a recent mitral valve endocarditis seen on TEE done on 02/05/2021, due to MSSA.  He completed course of IV antibiotics and was transitioned to oral antibiotic.  He presents with worsening back pain and focal neurologic deficit, weakness involving his left lower extremity.  Work-up revealed lumbar spine MRI with ongoing discitis/osteomyelitis at L1-L2 and L2-L3.  Epidural phlegmon along the posterior endplate centered at L1 with severe spinal canal stenosis with compression of cauda equina nerve roots.  Decreased size and number of bilateral psoas abscesses with small residual abscess bilaterally.  Diffuse signal abnormality throughout the psoas and paraspinal musculature consistent with inflammatory myositis.   Patient was transferred to [facility] for further neurosurgery evaluation.  Seen by infectious disease.  Repeated blood culture positive for MSSA 1 out of 2 bottles.    05/31/2021: Seen and examined at his bedside.  He had significant pain in his lower back.  IV pain medication last about 2 hours then wears off.  06/01/2021: Reports left hip pain worse than lower back pain.  He has been working with physical therapy ambulating with a walker.  Blood culture drawn on 05/28/2021 positive for MSSA.  Repeating blood culture on 06/01/2021.    Assessment/Plan: Principal Problem:   Infection of spine (HCC) Active Problems:   Depression, recurrent (HCC)   Diabetes mellitus (HCC)   Diabetic polyneuropathy associated with type 2 diabetes mellitus (HCC)   HTN (hypertension)   Endocarditis   Psoas abscess (HCC)   L1-L2 and L2-L3 osteomyelitis, POA, complicated with compression of cauda equina nerve roots. Left hip pain. Patient with persistent back pain and left lower extremity proximal weakness. Continue cefazolin as recommended by infectious  disease Continue pain management in place with IV Dilaudid for severe pain, p.o. morphine oxycodone for moderate pain He is requiring IV Dilaudid more frequently. He is also on as needed gabapentin and as needed Robaxin CRP 5.1, on 05/29/2021 Sed rate 77 on 04/09/2021, sed rate 44 on 05/31/2021. Appreciate neurosurgery assistance.  MSSA bacteremia 1 out of 2 bottles positive for MSSA, collected on 05/28/2021. Blood cultures repeated on 06/01/2021, follow. Currently on cefazolin Management per ID.  Left lower extremity weakness in the setting of osteomyelitis discitis at L1-2 and L2-3 with epidural phlegmon PT OT assessment Fall precautions Rest of management per neurosurgery   Type 2 diabetes mellitus with hyperglycemia.   Hemoglobin A1c 8.4 on 05/28/2021.   Hold off home oral hypoglycemics, metformin and empagliflozin.  Continue with insulin sliding scale for glucose coverage and monitoring.   Diabetic neuropathy.   Continue gabapentin and methocarbamol as needed.   Iron deficiency anemia Hgb is 12.0 and hct is 37.5  Iron studies done on 02/21/2021, showing iron deficiency. Hgb has been stable, patient not taking iron supplementation   HTN.   BP stable.   Continue to hold on antihypertensive medications to avoid hypotension.   Generalized weakness PT OT assessment Fall precautions     Patient continue to be at high risk for worsening neurologic deficit.    Status is: Inpatient   Remains inpatient appropriate because:Inpatient level of care appropriate due to severity of illness   Dispo: The patient is from: Home              Anticipated d/c is to: Home              Patient  currently is not medically stable to d/c.              Difficult to place patient No   DVT prophylaxis:      Scds   Code Status:               full  Family Communication:        No family at the bedside         Consultants:  ID Neurosurgery        Antimicrobials:  Cefazolin      Code  Status: Full code  Family Communication: None at bedside  Disposition Plan: Possibly CIR or home with home health services once neurosurgery signs off.   Consultants: Neurosurgery  Procedures: None  Antimicrobials: Cefazolin  DVT prophylaxis: SCDs  Status is: Inpatient    Dispo: The patient is from: Home.              Anticipated d/c is to: Possibly CIR              Patient currently medically not stable for discharge.   Difficult to place patient not applicable.        Objective: Vitals:   05/31/21 1455 05/31/21 2122 06/01/21 0841 06/01/21 1631  BP: 111/86 132/89 (!) 141/95 (!) 136/96  Pulse: 76 84 92 83  Resp: 14 16 17 17   Temp: 98.6 F (37 C) 98.9 F (37.2 C) 97.8 F (36.6 C) 98.8 F (37.1 C)  TempSrc: Oral Oral Oral Oral  SpO2: 98% 98% 100% 100%  Weight:      Height:        Intake/Output Summary (Last 24 hours) at 06/01/2021 1755 Last data filed at 06/01/2021 1300 Gross per 24 hour  Intake 240 ml  Output 500 ml  Net -260 ml   Filed Weights   05/29/21 1756  Weight: 77.1 kg    Exam:  General: 55 y.o. year-old male frail-appearing no acute distress.  Alert oriented x3.   Cardiovascular: Regular rate and rhythm no rubs or gallops. Respiratory: Clear to Auscultation No Wheezes or Rales.   Abdomen: S soft nontender normal bowel sounds present. Musculoskeletal: No lower extremity edema bilaterally. Skin: No ulcerative lesions noted. Psychiatry: Mood is appropriate for condition and setting.   Data Reviewed: CBC: Recent Labs  Lab 05/28/21 1353 05/28/21 1948 05/29/21 0404 06/01/21 0600  WBC 10.4 6.8 6.9 8.6  NEUTROABS 6.9  --   --   --   HGB 12.8* 12.4* 12.0* 12.8*  HCT 39.8 38.7* 37.5* 39.3  MCV 82.6 82.9 83.0 81.2  PLT 426* 411* 347 389   Basic Metabolic Panel: Recent Labs  Lab 05/28/21 1353 05/28/21 1948 05/29/21 0404 06/01/21 0600  NA 142  --  136 132*  K 4.1  --  4.2 4.5  CL 100  --  99 95*  CO2 32  --  30 26  GLUCOSE  192*  --  211* 179*  BUN 15  --  12 12  CREATININE 0.67 0.55* 0.59* 0.52*  CALCIUM 10.2  --  9.4 9.4  MG  --   --   --  1.7  PHOS  --   --   --  3.9   GFR: Estimated Creatinine Clearance: 110.3 mL/min (A) (by C-G formula based on SCr of 0.52 mg/dL (L)). Liver Function Tests: Recent Labs  Lab 05/28/21 1353 06/01/21 0600  AST 16 18  ALT 11 11  ALKPHOS 125 106  BILITOT 0.4 0.6  PROT 8.6*  7.3  ALBUMIN 3.6 2.9*   No results for input(s): LIPASE, AMYLASE in the last 168 hours. No results for input(s): AMMONIA in the last 168 hours. Coagulation Profile: No results for input(s): INR, PROTIME in the last 168 hours. Cardiac Enzymes: No results for input(s): CKTOTAL, CKMB, CKMBINDEX, TROPONINI in the last 168 hours. BNP (last 3 results) No results for input(s): PROBNP in the last 8760 hours. HbA1C: No results for input(s): HGBA1C in the last 72 hours.  CBG: Recent Labs  Lab 05/31/21 1655 05/31/21 2141 06/01/21 0630 06/01/21 1204 06/01/21 1634  GLUCAP 144* 242* 193* 248* 171*   Lipid Profile: No results for input(s): CHOL, HDL, LDLCALC, TRIG, CHOLHDL, LDLDIRECT in the last 72 hours. Thyroid Function Tests: No results for input(s): TSH, T4TOTAL, FREET4, T3FREE, THYROIDAB in the last 72 hours. Anemia Panel: No results for input(s): VITAMINB12, FOLATE, FERRITIN, TIBC, IRON, RETICCTPCT in the last 72 hours. Urine analysis:    Component Value Date/Time   COLORURINE COLORLESS (A) 05/28/2021 1353   APPEARANCEUR CLEAR 05/28/2021 1353   LABSPEC 1.002 (L) 05/28/2021 1353   PHURINE 8.0 05/28/2021 1353   GLUCOSEU NEGATIVE 05/28/2021 1353   HGBUR MODERATE (A) 05/28/2021 1353   BILIRUBINUR NEGATIVE 05/28/2021 1353   KETONESUR NEGATIVE 05/28/2021 1353   PROTEINUR NEGATIVE 05/28/2021 1353   NITRITE NEGATIVE 05/28/2021 1353   LEUKOCYTESUR NEGATIVE 05/28/2021 1353   Sepsis Labs: @LABRCNTIP (procalcitonin:4,lacticidven:4)  ) Recent Results (from the past 240 hour(s))  Culture,  blood (Routine X 2) w Reflex to ID Panel     Status: None (Preliminary result)   Collection Time: 05/28/21  5:20 PM   Specimen: BLOOD  Result Value Ref Range Status   Specimen Description   Final    BLOOD LEFT ANTECUBITAL Performed at [facility], [address], [place], [place] [address]    Special Requests   Final    BOTTLES DRAWN AEROBIC AND ANAEROBIC Blood Culture adequate volume Performed at [facility], [address] [place], [place] [address]    Culture   Final    NO GROWTH 4 DAYS Performed at [facility], [address], [place], [place] [address]    Report Status PENDING  Incomplete  Culture, blood (Routine X 2) w Reflex to ID Panel     Status: Abnormal (Preliminary result)   Collection Time: 05/28/21  5:22 PM   Specimen: BLOOD  Result Value Ref Range Status   Specimen Description   Final    BLOOD RIGHT ANTECUBITAL Performed at [facility], [address] [place], [place] [address]    Special Requests   Final    BOTTLES DRAWN AEROBIC AND ANAEROBIC Blood Culture results may not be optimal due to an excessive volume of blood received in culture bottles Performed at [facility], [address] [place], [place] [address]    Culture  Setup Time   Final    GRAM POSITIVE COCCI ANAEROBIC BOTTLE ONLY CRITICAL RESULT CALLED TO, READ BACK BY AND VERIFIED WITH: PHARMD GREG ABBOTT 05/31/2021@2 :56 BY TW    Culture (A)  Final    STAPHYLOCOCCUS AUREUS SUSCEPTIBILITIES TO FOLLOW Performed at [facility], [address] [place], [place] [address]    Report Status PENDING  Incomplete  Blood Culture ID Panel (Reflexed)     Status: Abnormal   Collection Time: 05/28/21  5:22 PM  Result Value Ref Range Status   Enterococcus faecalis NOT DETECTED NOT DETECTED Final   Enterococcus Faecium  NOT DETECTED NOT DETECTED Final   Listeria monocytogenes NOT DETECTED NOT DETECTED Final    Staphylococcus species DETECTED (A) NOT DETECTED Final    Comment: CRITICAL RESULT CALLED TO, READ BACK BY AND VERIFIED WITH: PHARMD GREG ABBOTT 05/31/2021@2 :56 BY TW    Staphylococcus aureus (BCID) DETECTED (A) NOT DETECTED Final    Comment: CRITICAL RESULT CALLED TO, READ BACK BY AND VERIFIED WITH: PHARMD GREG ABBOTT 05/31/2021@2 :56 BY TW    Staphylococcus epidermidis NOT DETECTED NOT DETECTED Final   Staphylococcus lugdunensis NOT DETECTED NOT DETECTED Final   Streptococcus species NOT DETECTED NOT DETECTED Final   Streptococcus agalactiae NOT DETECTED NOT DETECTED Final   Streptococcus pneumoniae NOT DETECTED NOT DETECTED Final   Streptococcus pyogenes NOT DETECTED NOT DETECTED Final   A.calcoaceticus-baumannii NOT DETECTED NOT DETECTED Final   Bacteroides fragilis NOT DETECTED NOT DETECTED Final   Enterobacterales NOT DETECTED NOT DETECTED Final   Enterobacter cloacae complex NOT DETECTED NOT DETECTED Final   Escherichia coli NOT DETECTED NOT DETECTED Final   Klebsiella aerogenes NOT DETECTED NOT DETECTED Final   Klebsiella oxytoca NOT DETECTED NOT DETECTED Final   Klebsiella pneumoniae NOT DETECTED NOT DETECTED Final   Proteus species NOT DETECTED NOT DETECTED Final   Salmonella species NOT DETECTED NOT DETECTED Final   Serratia marcescens NOT DETECTED NOT DETECTED Final   Haemophilus influenzae NOT DETECTED NOT DETECTED Final   Neisseria meningitidis NOT DETECTED NOT DETECTED Final   Pseudomonas aeruginosa NOT DETECTED NOT DETECTED Final   Stenotrophomonas maltophilia NOT DETECTED NOT DETECTED Final   Candida albicans NOT DETECTED NOT DETECTED Final   Candida auris NOT DETECTED NOT DETECTED Final   Candida glabrata NOT DETECTED NOT DETECTED Final   Candida krusei NOT DETECTED NOT DETECTED Final   Candida parapsilosis NOT DETECTED NOT DETECTED Final   Candida tropicalis NOT DETECTED NOT DETECTED Final   Cryptococcus neoformans/gattii NOT DETECTED NOT DETECTED Final   Meth  resistant mecA/C and MREJ NOT DETECTED NOT DETECTED Final    Comment: Performed at [facility], [address] [place], [place] [address]  Resp Panel by RT-PCR (Flu A&B, Covid) Nasopharyngeal Swab     Status: None   Collection Time: 05/28/21  5:30 PM   Specimen: Nasopharyngeal Swab; Nasopharyngeal(NP) swabs in vial transport medium  Result Value Ref Range Status   SARS Coronavirus 2 by RT PCR NEGATIVE NEGATIVE Final    Comment: (NOTE) SARS-CoV-2 target nucleic acids are NOT DETECTED.  The SARS-CoV-2 RNA is generally detectable in upper respiratory specimens during the acute phase of infection. The lowest concentration of SARS-CoV-2 viral copies this assay can detect is 138 copies/mL. A negative result does not preclude SARS-Cov-2 infection and should not be used as the sole basis for treatment or other patient management decisions. A negative result may occur with  improper specimen collection/handling, submission of specimen other than nasopharyngeal swab, presence of viral mutation(s) within the areas targeted by this assay, and inadequate number of viral copies(<138 copies/mL). A negative result must be combined with clinical observations, patient history, and epidemiological information. The expected result is Negative.  Fact Sheet for Patients:  BloggerCourse.com  Fact Sheet for Healthcare Providers:  SeriousBroker.it  This test is no t yet approved or cleared by the [place] FDA and  has been authorized for detection and/or diagnosis of SARS-CoV-2 by FDA under an Emergency Use Authorization (EUA). This EUA will remain  in effect (meaning this test can be used) for the duration of the COVID-19 declaration under  Section 564(b)(1) of the Act, 21 U.S.C.section 360bbb-3(b)(1), unless the authorization is terminated  or revoked sooner.       Influenza A by PCR NEGATIVE NEGATIVE Final   Influenza B by PCR NEGATIVE  NEGATIVE Final    Comment: (NOTE) The Xpert Xpress SARS-CoV-2/FLU/RSV plus assay is intended as an aid in the diagnosis of influenza from Nasopharyngeal swab specimens and should not be used as a sole basis for treatment. Nasal washings and aspirates are unacceptable for Xpert Xpress SARS-CoV-2/FLU/RSV testing.  Fact Sheet for Patients: BloggerCourse.com  Fact Sheet for Healthcare Providers: SeriousBroker.it  This test is not yet approved or cleared by the [place] FDA and has been authorized for detection and/or diagnosis of SARS-CoV-2 by FDA under an Emergency Use Authorization (EUA). This EUA will remain in effect (meaning this test can be used) for the duration of the COVID-19 declaration under Section 564(b)(1) of the Act, 21 U.S.C. section 360bbb-3(b)(1), unless the authorization is terminated or revoked.  Performed at [facility], [address], [place], [place] [address]   Culture, blood (routine x 2)     Status: None (Preliminary result)   Collection Time: 06/01/21  6:02 AM   Specimen: BLOOD RIGHT HAND  Result Value Ref Range Status   Specimen Description BLOOD RIGHT HAND  Final   Special Requests   Final    AEROBIC BOTTLE ONLY Blood Culture results may not be optimal due to an inadequate volume of blood received in culture bottles   Culture   Final    NO GROWTH < 12 HOURS Performed at [facility], [address], [place], [place] [address]    Report Status PENDING  Incomplete  Culture, blood (routine x 2)     Status: None (Preliminary result)   Collection Time: 06/01/21  6:02 AM   Specimen: BLOOD LEFT HAND  Result Value Ref Range Status   Specimen Description BLOOD LEFT HAND  Final   Special Requests   Final    AEROBIC BOTTLE ONLY Blood Culture results may not be optimal due to an inadequate volume of blood received in culture bottles   Culture   Final    NO GROWTH < 12  HOURS Performed at [facility], [address] [place], [place] [address]    Report Status PENDING  Incomplete      Studies: ECHOCARDIOGRAM COMPLETE  Result Date: 06/01/2021    ECHOCARDIOGRAM REPORT   Patient Name:   STEVIE CHARTER Date of Exam: 06/01/2021 Medical Rec #:  [id number]        Height:       70.0 in Accession #:    [id number]       Weight:       170.0 lb Date of Birth:  05-24-1966        BSA:          1.948 m Patient Age:    54 years         BP:           141/95 mmHg Patient Gender: M                HR:           92 bpm. Exam Location:  Inpatient Procedure: 2D Echo, Cardiac Doppler and Color Doppler Indications:    Bacteremia  History:        Patient has prior history of Echocardiogram examinations, most  recent 02/03/2021. Endocarditis, Signs/Symptoms:Dyspnea; Risk                 Factors:Diabetes, Dyslipidemia and Hypertension.  Sonographer:    Mikki Harbor Referring Phys: [phone] STEPHANIE N DIXON IMPRESSIONS  1. Left ventricular ejection fraction, by estimation, is 60 to 65%. The left ventricle has normal function. The left ventricle has no regional wall motion abnormalities. There is mild left ventricular hypertrophy. Left ventricular diastolic parameters are consistent with Grade I diastolic dysfunction (impaired relaxation).  2. Right ventricular systolic function is normal. The right ventricular size is normal.  3. The mitral valve is normal in structure. No evidence of mitral valve regurgitation. No evidence of mitral stenosis.  4. No obvious tricuspid valve vegetation seen (prior TEE vegetation was apparant).  5. The aortic valve is normal in structure. Aortic valve regurgitation is not visualized. No aortic stenosis is present.  6. The inferior vena cava is normal in size with greater than 50% respiratory variability, suggesting right atrial pressure of 3 mmHg. Conclusion(s)/Recommendation(s): No evidence of valvular vegetations on this transthoracic  echocardiogram. Would recommend a transesophageal echocardiogram to exclude infective endocarditis if clinically indicated. FINDINGS  Left Ventricle: Left ventricular ejection fraction, by estimation, is 60 to 65%. The left ventricle has normal function. The left ventricle has no regional wall motion abnormalities. The left ventricular internal cavity size was normal in size. There is  mild left ventricular hypertrophy. Left ventricular diastolic parameters are consistent with Grade I diastolic dysfunction (impaired relaxation). Right Ventricle: The right ventricular size is normal. No increase in right ventricular wall thickness. Right ventricular systolic function is normal. Left Atrium: Left atrial size was normal in size. Right Atrium: Right atrial size was normal in size. Pericardium: There is no evidence of pericardial effusion. Mitral Valve: The mitral valve is normal in structure. No evidence of mitral valve regurgitation. No evidence of mitral valve stenosis. MV peak gradient, 3.0 mmHg. The mean mitral valve gradient is 1.0 mmHg. Tricuspid Valve: No obvious tricuspid valve vegetation seen (prior TEE vegetation was apparant). The tricuspid valve is normal in structure. Tricuspid valve regurgitation is mild . No evidence of tricuspid stenosis. Aortic Valve: The aortic valve is normal in structure. Aortic valve regurgitation is not visualized. No aortic stenosis is present. Aortic valve mean gradient measures 3.0 mmHg. Aortic valve peak gradient measures 5.4 mmHg. Aortic valve area, by VTI measures 3.37 cm. Pulmonic Valve: The pulmonic valve was normal in structure. Pulmonic valve regurgitation is not visualized. No evidence of pulmonic stenosis. Aorta: The aortic root is normal in size and structure. Venous: The inferior vena cava is normal in size with greater than 50% respiratory variability, suggesting right atrial pressure of 3 mmHg. IAS/Shunts: No atrial level shunt detected by color flow Doppler.  LEFT  VENTRICLE PLAX 2D LVIDd:         4.30 cm   Diastology LVIDs:         3.00 cm   LV e' medial:    6.42 cm/s LV PW:         1.30 cm   LV E/e' medial:  11.7 LV IVS:        1.30 cm   LV e' lateral:   9.68 cm/s LVOT diam:     2.20 cm   LV E/e' lateral: 7.8 LV SV:         72 LV SV Index:   37 LVOT Area:     3.80 cm  RIGHT VENTRICLE RV Basal diam:  3.05  cm RV Mid diam:    3.50 cm LEFT ATRIUM             Index        RIGHT ATRIUM           Index LA diam:        2.70 cm 1.39 cm/m   RA Area:     12.80 cm LA Vol (A2C):   39.1 ml 20.07 ml/m  RA Volume:   23.80 ml  12.22 ml/m LA Vol (A4C):   34.9 ml 17.92 ml/m LA Biplane Vol: 37.4 ml 19.20 ml/m  AORTIC VALVE                    PULMONIC VALVE AV Area (Vmax):    2.87 cm     PV Vmax:       0.91 m/s AV Area (Vmean):   2.82 cm     PV Peak grad:  3.3 mmHg AV Area (VTI):     3.37 cm AV Vmax:           116.00 cm/s AV Vmean:          78.300 cm/s AV VTI:            0.213 m AV Peak Grad:      5.4 mmHg AV Mean Grad:      3.0 mmHg LVOT Vmax:         87.50 cm/s LVOT Vmean:        58.000 cm/s LVOT VTI:          0.189 m LVOT/AV VTI ratio: 0.89  AORTA Ao Root diam: 3.40 cm Ao Asc diam:  3.60 cm MITRAL VALVE               TRICUSPID VALVE MV Area (PHT): 2.65 cm    TV Peak grad:   20.8 mmHg MV Area VTI:   2.77 cm    TV Vmax:        2.28 m/s MV Peak grad:  3.0 mmHg MV Mean grad:  1.0 mmHg    SHUNTS MV Vmax:       0.86 m/s    Systemic VTI:  0.19 m MV Vmean:      54.1 cm/s   Systemic Diam: 2.20 cm MV Decel Time: 286 msec MV E velocity: 75.40 cm/s MV A velocity: 77.60 cm/s MV E/A ratio:  0.97 Donato Schultz MD Electronically signed by Donato Schultz MD Signature Date/Time: 06/01/2021/4:17:21 PM    Final     Scheduled Meds:  feeding supplement (GLUCERNA SHAKE)  237 mL Oral BID BM   insulin aspart  0-9 Units Subcutaneous TID WC   morphine  30 mg Oral BID   multivitamin with minerals  1 tablet Oral Daily   Ensure Max Protein  11 oz Oral QHS   senna-docusate  2 tablet Oral Daily     Continuous Infusions:   ceFAZolin (ANCEF) IV 2 g (06/01/21 1012)     LOS: 4 days     Darlin Drop, MD [facility] Pager [phone]  If 7PM-7AM, please contact night-coverage [email or web address] Password [email or web address] 06/01/2021, 5:55 PM

## 2021-06-02 DIAGNOSIS — M4620 Osteomyelitis of vertebra, site unspecified: Secondary | ICD-10-CM | POA: Diagnosis not present

## 2021-06-02 LAB — CULTURE, BLOOD (ROUTINE X 2)
Culture: NO GROWTH
Special Requests: ADEQUATE

## 2021-06-02 LAB — GLUCOSE, CAPILLARY
Glucose-Capillary: 190 mg/dL — ABNORMAL HIGH (ref 70–99)
Glucose-Capillary: 215 mg/dL — ABNORMAL HIGH (ref 70–99)
Glucose-Capillary: 185 mg/dL — ABNORMAL HIGH (ref 70–99)
Glucose-Capillary: 186 mg/dL — ABNORMAL HIGH (ref 70–99)

## 2021-06-02 MED ORDER — OXYCODONE-ACETAMINOPHEN 5-325 MG PO TABS
1.0000 | ORAL_TABLET | ORAL | Status: DC | PRN
  Administered 2021-06-02 (×2): 1 via ORAL
  Filled 2021-06-02 (×3): qty 1

## 2021-06-02 MED ORDER — HYDROMORPHONE HCL 1 MG/ML IJ SOLN
1.0000 mg | INTRAMUSCULAR | Status: DC | PRN
  Administered 2021-06-02 – 2021-06-06 (×20): 1 mg via INTRAVENOUS
  Filled 2021-06-02 (×20): qty 1

## 2021-06-02 MED ORDER — OXYCODONE HCL 5 MG PO TABS
5.0000 mg | ORAL_TABLET | Freq: Three times a day (TID) | ORAL | Status: DC | PRN
Start: 2021-06-02 — End: 2021-06-03
  Administered 2021-06-02 – 2021-06-03 (×3): 5 mg via ORAL
  Filled 2021-06-02 (×3): qty 1

## 2021-06-02 NOTE — Progress Notes (Signed)
Pt has been getting his PRN Dilaudid 1mg  every 2hours on the dot. MD was notified for proper pain management on the plan of DC.

## 2021-06-02 NOTE — Progress Notes (Signed)
PROGRESS NOTE  John Ware:[id number] DOB: 12-04-1965 DOA: 05/28/2021 PCP: Junie Spencer, FNP  HPI/Recap of past 74 hours: 55 year old male with a recent mitral valve endocarditis seen on TEE done on 02/05/2021, due to MSSA.  He completed course of IV antibiotics and was transitioned to oral antibiotic.  He presented on 05/28/21 with worsening back pain and focal neurologic deficit, left lower extremity weakness.  Work-up revealed lumbar spine MRI with ongoing discitis/osteomyelitis at L1-L2 and L2-L3.  Epidural phlegmon along the posterior endplate centered at L1 with severe spinal canal stenosis with compression of cauda equina nerve roots.  Decreased size and number of bilateral psoas abscesses with small residual abscess bilaterally.  Diffuse signal abnormality throughout the psoas and paraspinal musculature consistent with inflammatory myositis.   Patient was transferred to [facility] for further neurosurgery evaluation.  Seen by infectious disease.  Repeated blood culture (05/28/21) positive for MSSA 1 out of 2 bottles.  Repeated blood cultures on 06/01/2021, no growth today.  06/02/2021: Seen and examined at bedside.  His back pain is 4-5 out of 10 prior to getting his IV pain medication.    Assessment/Plan: Principal Problem:   Infection of spine (HCC) Active Problems:   Depression, recurrent (HCC)   Diabetes mellitus (HCC)   Diabetic polyneuropathy associated with type 2 diabetes mellitus (HCC)   HTN (hypertension)   Endocarditis   Psoas abscess (HCC)   L1-L2 and L2-L3 osteomyelitis, POA, complicated with compression of cauda equina nerve roots. Left hip pain. Left lower extremity weakness. Patient with persistent back pain and left lower extremity proximal weakness. Continue cefazolin as recommended by infectious disease Continue pain management in place with IV Dilaudid for severe pain, p.o. morphine oxycodone for moderate pain He is requiring IV Dilaudid more  frequently. He is also on as needed gabapentin and as needed Robaxin CRP 5.1, on 05/29/2021 Sed rate 77 on 04/09/2021, sed rate 44 on 05/31/2021. Appreciate neurosurgery and infectious disease's assistance.  MSSA bacteremia 1 out of 2 bottles positive for MSSA, collected on 05/28/2021. Blood cultures repeated on 06/01/2021 negative to date, continue to follow. Currently on cefazolin per infectious disease recommendations. Afebrile with no leukocytosis. Will need negative blood cultures > 48 hours prior to PICC line insertion.  Left lower extremity weakness in the setting of osteomyelitis discitis at L1-2 and L2-3 with epidural phlegmon PT OT assessment, recommended no OT or PT follow-up. Continue fall precautions Rest of management per neurosurgery   Type 2 diabetes mellitus with hyperglycemia.   Hemoglobin A1c 8.4 on 05/28/2021.   Hold off home oral hypoglycemics, metformin and empagliflozin.  Continue with insulin sliding scale for glucose coverage and monitoring.   Diabetic neuropathy.   Continue gabapentin and methocarbamol as needed.   Iron deficiency anemia, stable. Hgb is 12.0 and hct is 37.5  Iron studies done on 02/21/2021, showing iron deficiency. Hgb has been stable, patient not taking iron supplementation   HTN.   BP stable.   Continue to hold on antihypertensive medications to avoid hypotension.   Generalized weakness PT OT assessment Fall precautions     Patient continue to be at high risk for worsening neurologic deficit.    Status is: Inpatient   Remains inpatient appropriate because:Inpatient level of care appropriate due to severity of illness   Dispo: The patient is from: Home              Anticipated d/c is to: Home  Patient currently is not medically stable to d/c.              Difficult to place patient No   DVT prophylaxis:      Scds   Code Status:               full  Family Communication:        No family at the bedside          Consultants:  ID Neurosurgery        Antimicrobials:  Cefazolin      Code Status: Full code  Family Communication: None at bedside  Disposition Plan: Possibly CIR or home with home health services once neurosurgery signs off.   Consultants: Neurosurgery  Procedures: None  Antimicrobials: Cefazolin  DVT prophylaxis: SCDs  Status is: Inpatient    Dispo: The patient is from: Home.              Anticipated d/c is to: Home after infectious disease signs off.              Patient currently medically not stable for discharge.   Difficult to place patient not applicable.        Objective: Vitals:   06/01/21 1631 06/01/21 2030 06/02/21 0751 06/02/21 1402  BP: (!) 136/96 (!) 152/106 122/83 124/88  Pulse: 83 84 72 88  Resp: 17 16 17 15   Temp: 98.8 F (37.1 C) 98.9 F (37.2 C) 98.2 F (36.8 C) 98 F (36.7 C)  TempSrc: Oral Oral Oral   SpO2: 100% 99% 98% 99%  Weight:      Height:        Intake/Output Summary (Last 24 hours) at 06/02/2021 1448 Last data filed at 06/02/2021 0800 Gross per 24 hour  Intake 240 ml  Output 750 ml  Net -510 ml   Filed Weights   05/29/21 1756  Weight: 77.1 kg    Exam:  General: 55 y.o. year-old male frail-appearing in no acute distress.  He is alert and oriented x3.   Cardiovascular: Regular rate and rhythm no rubs or gallops.  Respiratory: Clear to auscultation with no wheezes or rales.  Abdomen: Soft nontender normal bowel sounds present.  Musculoskeletal: No lower extremity edema bilaterally.   Skin: No ulcerations lesions noted.   Psychiatry: Mood is appropriate for condition and setting.   Data Reviewed: CBC: Recent Labs  Lab 05/28/21 1353 05/28/21 1948 05/29/21 0404 06/01/21 0600  WBC 10.4 6.8 6.9 8.6  NEUTROABS 6.9  --   --   --   HGB 12.8* 12.4* 12.0* 12.8*  HCT 39.8 38.7* 37.5* 39.3  MCV 82.6 82.9 83.0 81.2  PLT 426* 411* 347 389   Basic Metabolic Panel: Recent Labs  Lab 05/28/21 1353  05/28/21 1948 05/29/21 0404 06/01/21 0600  NA 142  --  136 132*  K 4.1  --  4.2 4.5  CL 100  --  99 95*  CO2 32  --  30 26  GLUCOSE 192*  --  211* 179*  BUN 15  --  12 12  CREATININE 0.67 0.55* 0.59* 0.52*  CALCIUM 10.2  --  9.4 9.4  MG  --   --   --  1.7  PHOS  --   --   --  3.9   GFR: Estimated Creatinine Clearance: 110.3 mL/min (A) (by C-G formula based on SCr of 0.52 mg/dL (L)). Liver Function Tests: Recent Labs  Lab 05/28/21 1353 06/01/21 0600  AST 16 18  ALT 11 11  ALKPHOS 125 106  BILITOT 0.4 0.6  PROT 8.6* 7.3  ALBUMIN 3.6 2.9*   No results for input(s): LIPASE, AMYLASE in the last 168 hours. No results for input(s): AMMONIA in the last 168 hours. Coagulation Profile: No results for input(s): INR, PROTIME in the last 168 hours. Cardiac Enzymes: No results for input(s): CKTOTAL, CKMB, CKMBINDEX, TROPONINI in the last 168 hours. BNP (last 3 results) No results for input(s): PROBNP in the last 8760 hours. HbA1C: No results for input(s): HGBA1C in the last 72 hours.  CBG: Recent Labs  Lab 06/01/21 1204 06/01/21 1634 06/01/21 2111 06/02/21 0919 06/02/21 1148  GLUCAP 248* 171* 189* 190* 215*   Lipid Profile: No results for input(s): CHOL, HDL, LDLCALC, TRIG, CHOLHDL, LDLDIRECT in the last 72 hours. Thyroid Function Tests: No results for input(s): TSH, T4TOTAL, FREET4, T3FREE, THYROIDAB in the last 72 hours. Anemia Panel: No results for input(s): VITAMINB12, FOLATE, FERRITIN, TIBC, IRON, RETICCTPCT in the last 72 hours. Urine analysis:    Component Value Date/Time   COLORURINE COLORLESS (A) 05/28/2021 1353   APPEARANCEUR CLEAR 05/28/2021 1353   LABSPEC 1.002 (L) 05/28/2021 1353   PHURINE 8.0 05/28/2021 1353   GLUCOSEU NEGATIVE 05/28/2021 1353   HGBUR MODERATE (A) 05/28/2021 1353   BILIRUBINUR NEGATIVE 05/28/2021 1353   KETONESUR NEGATIVE 05/28/2021 1353   PROTEINUR NEGATIVE 05/28/2021 1353   NITRITE NEGATIVE 05/28/2021 1353   LEUKOCYTESUR NEGATIVE  05/28/2021 1353   Sepsis Labs: @LABRCNTIP (procalcitonin:4,lacticidven:4)  ) Recent Results (from the past 240 hour(s))  Culture, blood (Routine X 2) w Reflex to ID Panel     Status: None (Preliminary result)   Collection Time: 05/28/21  5:20 PM   Specimen: BLOOD  Result Value Ref Range Status   Specimen Description   Final    BLOOD LEFT ANTECUBITAL Performed at [facility], [address], [place], [place] [address]    Special Requests   Final    BOTTLES DRAWN AEROBIC AND ANAEROBIC Blood Culture adequate volume Performed at [facility], [address], [place], [place] [address]    Culture   Final    NO GROWTH 4 DAYS Performed at [facility], [address], [place], [place] [address]    Report Status PENDING  Incomplete  Culture, blood (Routine X 2) w Reflex to ID Panel     Status: Abnormal (Preliminary result)   Collection Time: 05/28/21  5:22 PM   Specimen: BLOOD  Result Value Ref Range Status   Specimen Description   Final    BLOOD RIGHT ANTECUBITAL Performed at [facility], [address], [place], [place] [address]    Special Requests   Final    BOTTLES DRAWN AEROBIC AND ANAEROBIC Blood Culture results may not be optimal due to an excessive volume of blood received in culture bottles Performed at [facility], [address], [place], [place] [address]    Culture  Setup Time   Final    GRAM POSITIVE COCCI ANAEROBIC BOTTLE ONLY CRITICAL RESULT CALLED TO, READ BACK BY AND VERIFIED WITH: PHARMD GREG ABBOTT 05/31/2021@2 :56 BY TW    Culture (A)  Final    STAPHYLOCOCCUS AUREUS CULTURE REINCUBATED FOR BETTER GROWTH Performed at [facility], [address] [place], [place] [address]    Report Status PENDING  Incomplete  Blood Culture ID Panel (Reflexed)     Status: Abnormal   Collection Time: 05/28/21  5:22 PM  Result Value  Ref Range Status   Enterococcus faecalis NOT DETECTED  NOT DETECTED Final   Enterococcus Faecium NOT DETECTED NOT DETECTED Final   Listeria monocytogenes NOT DETECTED NOT DETECTED Final   Staphylococcus species DETECTED (A) NOT DETECTED Final    Comment: CRITICAL RESULT CALLED TO, READ BACK BY AND VERIFIED WITH: PHARMD GREG ABBOTT 05/31/2021@2 :56 BY TW    Staphylococcus aureus (BCID) DETECTED (A) NOT DETECTED Final    Comment: CRITICAL RESULT CALLED TO, READ BACK BY AND VERIFIED WITH: PHARMD GREG ABBOTT 05/31/2021@2 :56 BY TW    Staphylococcus epidermidis NOT DETECTED NOT DETECTED Final   Staphylococcus lugdunensis NOT DETECTED NOT DETECTED Final   Streptococcus species NOT DETECTED NOT DETECTED Final   Streptococcus agalactiae NOT DETECTED NOT DETECTED Final   Streptococcus pneumoniae NOT DETECTED NOT DETECTED Final   Streptococcus pyogenes NOT DETECTED NOT DETECTED Final   A.calcoaceticus-baumannii NOT DETECTED NOT DETECTED Final   Bacteroides fragilis NOT DETECTED NOT DETECTED Final   Enterobacterales NOT DETECTED NOT DETECTED Final   Enterobacter cloacae complex NOT DETECTED NOT DETECTED Final   Escherichia coli NOT DETECTED NOT DETECTED Final   Klebsiella aerogenes NOT DETECTED NOT DETECTED Final   Klebsiella oxytoca NOT DETECTED NOT DETECTED Final   Klebsiella pneumoniae NOT DETECTED NOT DETECTED Final   Proteus species NOT DETECTED NOT DETECTED Final   Salmonella species NOT DETECTED NOT DETECTED Final   Serratia marcescens NOT DETECTED NOT DETECTED Final   Haemophilus influenzae NOT DETECTED NOT DETECTED Final   Neisseria meningitidis NOT DETECTED NOT DETECTED Final   Pseudomonas aeruginosa NOT DETECTED NOT DETECTED Final   Stenotrophomonas maltophilia NOT DETECTED NOT DETECTED Final   Candida albicans NOT DETECTED NOT DETECTED Final   Candida auris NOT DETECTED NOT DETECTED Final   Candida glabrata NOT DETECTED NOT DETECTED Final   Candida krusei NOT DETECTED NOT DETECTED Final   Candida parapsilosis NOT DETECTED NOT DETECTED  Final   Candida tropicalis NOT DETECTED NOT DETECTED Final   Cryptococcus neoformans/gattii NOT DETECTED NOT DETECTED Final   Meth resistant mecA/C and MREJ NOT DETECTED NOT DETECTED Final    Comment: Performed at [facility], [address] [place], [place] [address]  Resp Panel by RT-PCR (Flu A&B, Covid) Nasopharyngeal Swab     Status: None   Collection Time: 05/28/21  5:30 PM   Specimen: Nasopharyngeal Swab; Nasopharyngeal(NP) swabs in vial transport medium  Result Value Ref Range Status   SARS Coronavirus 2 by RT PCR NEGATIVE NEGATIVE Final    Comment: (NOTE) SARS-CoV-2 target nucleic acids are NOT DETECTED.  The SARS-CoV-2 RNA is generally detectable in upper respiratory specimens during the acute phase of infection. The lowest concentration of SARS-CoV-2 viral copies this assay can detect is 138 copies/mL. A negative result does not preclude SARS-Cov-2 infection and should not be used as the sole basis for treatment or other patient management decisions. A negative result may occur with  improper specimen collection/handling, submission of specimen other than nasopharyngeal swab, presence of viral mutation(s) within the areas targeted by this assay, and inadequate number of viral copies(<138 copies/mL). A negative result must be combined with clinical observations, patient history, and epidemiological information. The expected result is Negative.  Fact Sheet for Patients:  BloggerCourse.com  Fact Sheet for Healthcare Providers:  SeriousBroker.it  This test is no t yet approved or cleared by the [place] FDA and  has been authorized for detection and/or diagnosis of SARS-CoV-2 by FDA under an Emergency Use Authorization (EUA). This EUA will remain  in effect (meaning this test can be used) for the duration of the COVID-19 declaration under Section 564(b)(1) of the Act, 21 U.S.C.section 360bbb-3(b)(1), unless the  authorization is terminated  or revoked sooner.       Influenza A by PCR NEGATIVE NEGATIVE Final   Influenza B by PCR NEGATIVE NEGATIVE Final    Comment: (NOTE) The Xpert Xpress SARS-CoV-2/FLU/RSV plus assay is intended as an aid in the diagnosis of influenza from Nasopharyngeal swab specimens and should not be used as a sole basis for treatment. Nasal washings and aspirates are unacceptable for Xpert Xpress SARS-CoV-2/FLU/RSV testing.  Fact Sheet for Patients: BloggerCourse.com  Fact Sheet for Healthcare Providers: SeriousBroker.it  This test is not yet approved or cleared by the [place] FDA and has been authorized for detection and/or diagnosis of SARS-CoV-2 by FDA under an Emergency Use Authorization (EUA). This EUA will remain in effect (meaning this test can be used) for the duration of the COVID-19 declaration under Section 564(b)(1) of the Act, 21 U.S.C. section 360bbb-3(b)(1), unless the authorization is terminated or revoked.  Performed at [facility], [address], [place], [place] [address]   Culture, blood (routine x 2)     Status: None (Preliminary result)   Collection Time: 06/01/21  6:02 AM   Specimen: BLOOD RIGHT HAND  Result Value Ref Range Status   Specimen Description BLOOD RIGHT HAND  Final   Special Requests   Final    AEROBIC BOTTLE ONLY Blood Culture results may not be optimal due to an inadequate volume of blood received in culture bottles   Culture   Final    NO GROWTH < 12 HOURS Performed at [facility], [address] [place], [place] [address]    Report Status PENDING  Incomplete  Culture, blood (routine x 2)     Status: None (Preliminary result)   Collection Time: 06/01/21  6:02 AM   Specimen: BLOOD LEFT HAND  Result Value Ref Range Status   Specimen Description BLOOD LEFT HAND  Final   Special Requests   Final    AEROBIC BOTTLE ONLY Blood Culture results  may not be optimal due to an inadequate volume of blood received in culture bottles   Culture   Final    NO GROWTH < 12 HOURS Performed at [facility], [address] [place], [place] [address]    Report Status PENDING  Incomplete      Studies: No results found.  Scheduled Meds:  feeding supplement (GLUCERNA SHAKE)  237 mL Oral BID BM   insulin aspart  0-9 Units Subcutaneous TID WC   morphine  30 mg Oral BID   multivitamin with minerals  1 tablet Oral Daily   Ensure Max Protein  11 oz Oral QHS   senna-docusate  2 tablet Oral Daily    Continuous Infusions:   ceFAZolin (ANCEF) IV 2 g (06/02/21 0810)     LOS: 5 days     Darlin Drop, MD [facility] Pager [phone]  If 7PM-7AM, please contact night-coverage [email or web address] Password [email or web address] 06/02/2021, 2:48 PM

## 2021-06-03 DIAGNOSIS — M4620 Osteomyelitis of vertebra, site unspecified: Secondary | ICD-10-CM | POA: Diagnosis not present

## 2021-06-03 LAB — GLUCOSE, CAPILLARY
Glucose-Capillary: 162 mg/dL — ABNORMAL HIGH (ref 70–99)
Glucose-Capillary: 188 mg/dL — ABNORMAL HIGH (ref 70–99)
Glucose-Capillary: 237 mg/dL — ABNORMAL HIGH (ref 70–99)
Glucose-Capillary: 196 mg/dL — ABNORMAL HIGH (ref 70–99)

## 2021-06-03 MED ORDER — NALOXONE HCL 0.4 MG/ML IJ SOLN: 0.4000 mg | INTRAMUSCULAR | Status: DC | PRN

## 2021-06-03 MED ORDER — OXYCODONE-ACETAMINOPHEN 5-325 MG PO TABS
1.0000 | ORAL_TABLET | ORAL | Status: DC | PRN
Start: 2021-06-03 — End: 2021-06-08
  Administered 2021-06-03 – 2021-06-08 (×29): 1 via ORAL
  Filled 2021-06-03 (×29): qty 1

## 2021-06-03 MED ORDER — OXYCODONE HCL 5 MG PO TABS
5.0000 mg | ORAL_TABLET | ORAL | Status: DC | PRN
  Administered 2021-06-03 – 2021-06-08 (×29): 5 mg via ORAL
  Filled 2021-06-03 (×29): qty 1

## 2021-06-03 NOTE — Progress Notes (Signed)
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  He completed course of IV antibiotics and was transitioned to oral antibiotic.  He presented on 05/28/21 with worsening back pain and focal neurologic deficit, left lower extremity weakness.  Work-up revealed lumbar spine MRI with ongoing discitis/osteomyelitis at L1-L2 and L2-L3.  Epidural phlegmon along the posterior endplate centered at L1 with severe spinal canal stenosis with compression of cauda equina nerve roots.  Decreased size and number of bilateral psoas abscesses with small residual abscess bilaterally.  Diffuse signal abnormality throughout the psoas and paraspinal musculature consistent with inflammatory myositis.   Patient was transferred to [facility] for further neurosurgery evaluation.  Seen by infectious disease.  Repeated blood culture (05/28/21) positive for MSSA 1 out of 2 bottles.  Repeated blood cultures on 06/01/2021, no growth today.  06/03/2021: Seen and examined at his bedside.  We have been weaning him off the IV Dilaudid, his pain is currently about 5 out of 10.  He has no other complaints    Assessment/Plan: Principal Problem:   Infection of spine (HCC) Active Problems:   Depression, recurrent (HCC)   Diabetes mellitus (HCC)   Diabetic polyneuropathy associated with type 2 diabetes mellitus (HCC)   HTN (hypertension)   Endocarditis   Psoas abscess (HCC)   L1-L2 and L2-L3 osteomyelitis, POA, complicated with compression of cauda equina nerve roots. Left hip pain. Left lower extremity weakness. Patient with persistent back pain and left lower extremity proximal weakness. Continue cefazolin as recommended by infectious disease Continue pain management in place with IV Dilaudid for severe pain, p.o. morphine oxycodone for moderate  pain Continue to wean off IV Dilaudid as tolerated. He is also on as needed gabapentin and as needed Robaxin CRP 5.1, on 05/29/2021 Sed rate 77 on 04/09/2021, sed rate 44 on 05/31/2021. Appreciate neurosurgery and infectious disease's assistance.  MSSA bacteremia 1 out of 2 bottles positive for MSSA, collected on 05/28/2021. Blood cultures repeated on 06/01/2021 repeated blood cultures negative to date.   Continue cefazolin per infectious disease recommendations. Afebrile with no leukocytosis. Will need negative blood cultures > 48 hours prior to PICC line insertion. Obtain CBC in the morning.  Left lower extremity weakness in the setting of osteomyelitis discitis at L1-2 and L2-3 with epidural phlegmon PT OT assessment, recommended no OT or PT follow-up. Continue fall precautions Rest of management per neurosurgery   Type 2 diabetes mellitus with hyperglycemia.   Hemoglobin A1c 8.4 on 05/28/2021.   Hold off home oral hypoglycemics, metformin and empagliflozin.  Continue with insulin sliding scale for glucose coverage and monitoring.   Diabetic neuropathy.   Continue gabapentin and methocarbamol as needed.   Iron deficiency anemia, stable. Hgb is 12.0 and hct is 37.5  Iron studies done on 02/21/2021, showing iron deficiency. Hgb has been stable, patient not taking iron supplementation   HTN.   BP stable.   Continue to hold on antihypertensive medications to avoid hypotension.   Generalized weakness PT OT assessment Fall precautions     Patient continue to be at high risk for worsening neurologic deficit.    Status is: Inpatient   Remains inpatient appropriate because:Inpatient level of care appropriate due to severity of illness   Dispo: The patient is from: Home  Anticipated d/c is to: Home              Patient currently is not medically stable to d/c.              Difficult to place patient No   DVT prophylaxis:      Scds   Code Status:               full   Family Communication:        No family at the bedside         Consultants:  ID Neurosurgery        Antimicrobials:  Cefazolin      Code Status: Full code  Family Communication: None at bedside  Disposition Plan: Possibly CIR or home with home health services once neurosurgery signs off.   Consultants: Neurosurgery  Procedures: None  Antimicrobials: Cefazolin  DVT prophylaxis: SCDs  Status is: Inpatient    Dispo: The patient is from: Home.              Anticipated d/c is to: Home after infectious disease signs off.              Patient currently medically not stable for discharge.   Difficult to place patient not applicable.        Objective: Vitals:   06/02/21 0751 06/02/21 1402 06/02/21 1953 06/03/21 0801  BP: 122/83 124/88 128/78 106/89  Pulse: 72 88 81 79  Resp: 17 15 16 16   Temp: 98.2 F (36.8 C) 98 F (36.7 C) 99 F (37.2 C) 98.8 F (37.1 C)  TempSrc: Oral  Oral Oral  SpO2: 98% 99% 97% 99%  Weight:      Height:        Intake/Output Summary (Last 24 hours) at 06/03/2021 1206 Last data filed at 06/03/2021 0754 Gross per 24 hour  Intake 600 ml  Output 400 ml  Net 200 ml   Filed Weights   05/29/21 1756  Weight: 77.1 kg    Exam:  General: 55 y.o. year-old male frail-appearing in no acute distress.  He is alert and oriented times 3.   Cardiovascular: Regular rate and rhythm no rubs or gallops. Respiratory: Clear to auscultation with no wheezes or rales. Abdomen: Soft nontender normal bowel sounds present. Musculoskeletal: No lower extremity edema bilaterally. Skin: No ulcerative lesions noted.   Psychiatry: Mood is appropriate for condition and setting.   Data Reviewed: CBC: Recent Labs  Lab 05/28/21 1353 05/28/21 1948 05/29/21 0404 06/01/21 0600  WBC 10.4 6.8 6.9 8.6  NEUTROABS 6.9  --   --   --   HGB 12.8* 12.4* 12.0* 12.8*  HCT 39.8 38.7* 37.5* 39.3  MCV 82.6 82.9 83.0 81.2  PLT 426* 411* 347 389   Basic  Metabolic Panel: Recent Labs  Lab 05/28/21 1353 05/28/21 1948 05/29/21 0404 06/01/21 0600  NA 142  --  136 132*  K 4.1  --  4.2 4.5  CL 100  --  99 95*  CO2 32  --  30 26  GLUCOSE 192*  --  211* 179*  BUN 15  --  12 12  CREATININE 0.67 0.55* 0.59* 0.52*  CALCIUM 10.2  --  9.4 9.4  MG  --   --   --  1.7  PHOS  --   --   --  3.9   GFR: Estimated Creatinine Clearance: 110.3 mL/min (A) (by C-G formula based on SCr of 0.52 mg/dL (L)). Liver Function Tests: Recent  Labs  Lab 05/28/21 1353 06/01/21 0600  AST 16 18  ALT 11 11  ALKPHOS 125 106  BILITOT 0.4 0.6  PROT 8.6* 7.3  ALBUMIN 3.6 2.9*   No results for input(s): LIPASE, AMYLASE in the last 168 hours. No results for input(s): AMMONIA in the last 168 hours. Coagulation Profile: No results for input(s): INR, PROTIME in the last 168 hours. Cardiac Enzymes: No results for input(s): CKTOTAL, CKMB, CKMBINDEX, TROPONINI in the last 168 hours. BNP (last 3 results) No results for input(s): PROBNP in the last 8760 hours. HbA1C: No results for input(s): HGBA1C in the last 72 hours.  CBG: Recent Labs  Lab 06/02/21 1148 06/02/21 1551 06/02/21 2054 06/03/21 0628 06/03/21 1157  GLUCAP 215* 185* 186* 162* 188*   Lipid Profile: No results for input(s): CHOL, HDL, LDLCALC, TRIG, CHOLHDL, LDLDIRECT in the last 72 hours. Thyroid Function Tests: No results for input(s): TSH, T4TOTAL, FREET4, T3FREE, THYROIDAB in the last 72 hours. Anemia Panel: No results for input(s): VITAMINB12, FOLATE, FERRITIN, TIBC, IRON, RETICCTPCT in the last 72 hours. Urine analysis:    Component Value Date/Time   COLORURINE COLORLESS (A) 05/28/2021 1353   APPEARANCEUR CLEAR 05/28/2021 1353   LABSPEC 1.002 (L) 05/28/2021 1353   PHURINE 8.0 05/28/2021 1353   GLUCOSEU NEGATIVE 05/28/2021 1353   HGBUR MODERATE (A) 05/28/2021 1353   BILIRUBINUR NEGATIVE 05/28/2021 1353   KETONESUR NEGATIVE 05/28/2021 1353   PROTEINUR NEGATIVE 05/28/2021 1353    NITRITE NEGATIVE 05/28/2021 1353   LEUKOCYTESUR NEGATIVE 05/28/2021 1353   Sepsis Labs: @LABRCNTIP (procalcitonin:4,lacticidven:4)  ) Recent Results (from the past 240 hour(s))  Culture, blood (Routine X 2) w Reflex to ID Panel     Status: None   Collection Time: 05/28/21  5:20 PM   Specimen: BLOOD  Result Value Ref Range Status   Specimen Description   Final    BLOOD LEFT ANTECUBITAL Performed at [facility], [address], [place], [place]    Special Requests   Final    BOTTLES DRAWN AEROBIC AND ANAEROBIC Blood Culture adequate volume Performed at [facility], [address] [place], [place]    Culture   Final    NO GROWTH 5 DAYS Performed at [facility], [address] [place], [place]    Report Status 06/02/2021 FINAL  Final  Culture, blood (Routine X 2) w Reflex to ID Panel     Status: Abnormal (Preliminary result)   Collection Time: 05/28/21  5:22 PM   Specimen: BLOOD  Result Value Ref Range Status   Specimen Description   Final    BLOOD RIGHT ANTECUBITAL Performed at [facility], [address] [place], [place]    Special Requests   Final    BOTTLES DRAWN AEROBIC AND ANAEROBIC Blood Culture results may not be optimal due to an excessive volume of blood received in culture bottles Performed at [facility], [address], [place], [place]    Culture  Setup Time   Final    GRAM POSITIVE COCCI ANAEROBIC BOTTLE ONLY CRITICAL RESULT CALLED TO, READ BACK BY AND VERIFIED WITH: PHARMD GREG ABBOTT 05/31/2021@2 :56 BY TW    Culture (A)  Final    STAPHYLOCOCCUS AUREUS SUSCEPTIBILITIES TO FOLLOW Performed at [facility], [address] [place], [place]    Report Status PENDING  Incomplete  Blood Culture ID Panel (Reflexed)     Status: Abnormal  Collection Time: 05/28/21  5:22 PM  Result Value Ref Range Status    Enterococcus faecalis NOT DETECTED NOT DETECTED Final   Enterococcus Faecium NOT DETECTED NOT DETECTED Final   Listeria monocytogenes NOT DETECTED NOT DETECTED Final   Staphylococcus species DETECTED (A) NOT DETECTED Final    Comment: CRITICAL RESULT CALLED TO, READ BACK BY AND VERIFIED WITH: PHARMD GREG ABBOTT 05/31/2021@2 :56 BY TW    Staphylococcus aureus (BCID) DETECTED (A) NOT DETECTED Final    Comment: CRITICAL RESULT CALLED TO, READ BACK BY AND VERIFIED WITH: PHARMD GREG ABBOTT 05/31/2021@2 :56 BY TW    Staphylococcus epidermidis NOT DETECTED NOT DETECTED Final   Staphylococcus lugdunensis NOT DETECTED NOT DETECTED Final   Streptococcus species NOT DETECTED NOT DETECTED Final   Streptococcus agalactiae NOT DETECTED NOT DETECTED Final   Streptococcus pneumoniae NOT DETECTED NOT DETECTED Final   Streptococcus pyogenes NOT DETECTED NOT DETECTED Final   A.calcoaceticus-baumannii NOT DETECTED NOT DETECTED Final   Bacteroides fragilis NOT DETECTED NOT DETECTED Final   Enterobacterales NOT DETECTED NOT DETECTED Final   Enterobacter cloacae complex NOT DETECTED NOT DETECTED Final   Escherichia coli NOT DETECTED NOT DETECTED Final   Klebsiella aerogenes NOT DETECTED NOT DETECTED Final   Klebsiella oxytoca NOT DETECTED NOT DETECTED Final   Klebsiella pneumoniae NOT DETECTED NOT DETECTED Final   Proteus species NOT DETECTED NOT DETECTED Final   Salmonella species NOT DETECTED NOT DETECTED Final   Serratia marcescens NOT DETECTED NOT DETECTED Final   Haemophilus influenzae NOT DETECTED NOT DETECTED Final   Neisseria meningitidis NOT DETECTED NOT DETECTED Final   Pseudomonas aeruginosa NOT DETECTED NOT DETECTED Final   Stenotrophomonas maltophilia NOT DETECTED NOT DETECTED Final   Candida albicans NOT DETECTED NOT DETECTED Final   Candida auris NOT DETECTED NOT DETECTED Final   Candida glabrata NOT DETECTED NOT DETECTED Final   Candida krusei NOT DETECTED NOT DETECTED Final   Candida  parapsilosis NOT DETECTED NOT DETECTED Final   Candida tropicalis NOT DETECTED NOT DETECTED Final   Cryptococcus neoformans/gattii NOT DETECTED NOT DETECTED Final   Meth resistant mecA/C and MREJ NOT DETECTED NOT DETECTED Final    Comment: Performed at [facility], [address], [place], [place] [address]  Resp Panel by RT-PCR (Flu A&B, Covid) Nasopharyngeal Swab     Status: None   Collection Time: 05/28/21  5:30 PM   Specimen: Nasopharyngeal Swab; Nasopharyngeal(NP) swabs in vial transport medium  Result Value Ref Range Status   SARS Coronavirus 2 by RT PCR NEGATIVE NEGATIVE Final    Comment: (NOTE) SARS-CoV-2 target nucleic acids are NOT DETECTED.  The SARS-CoV-2 RNA is generally detectable in upper respiratory specimens during the acute phase of infection. The lowest concentration of SARS-CoV-2 viral copies this assay can detect is 138 copies/mL. A negative result does not preclude SARS-Cov-2 infection and should not be used as the sole basis for treatment or other patient management decisions. A negative result may occur with  improper specimen collection/handling, submission of specimen other than nasopharyngeal swab, presence of viral mutation(s) within the areas targeted by this assay, and inadequate number of viral copies(<138 copies/mL). A negative result must be combined with clinical observations, patient history, and epidemiological information. The expected result is Negative.  Fact Sheet for Patients:  BloggerCourse.com  Fact Sheet for Healthcare Providers:  SeriousBroker.it  This test is no t yet approved or cleared by the [place] FDA and  has been authorized for detection and/or diagnosis of SARS-CoV-2 by FDA under an  Emergency Use Authorization (EUA). This EUA will remain  in effect (meaning this test can be used) for the duration of the COVID-19 declaration under Section 564(b)(1) of the Act,  21 U.S.C.section 360bbb-3(b)(1), unless the authorization is terminated  or revoked sooner.       Influenza A by PCR NEGATIVE NEGATIVE Final   Influenza B by PCR NEGATIVE NEGATIVE Final    Comment: (NOTE) The Xpert Xpress SARS-CoV-2/FLU/RSV plus assay is intended as an aid in the diagnosis of influenza from Nasopharyngeal swab specimens and should not be used as a sole basis for treatment. Nasal washings and aspirates are unacceptable for Xpert Xpress SARS-CoV-2/FLU/RSV testing.  Fact Sheet for Patients: BloggerCourse.com  Fact Sheet for Healthcare Providers: SeriousBroker.it  This test is not yet approved or cleared by the [place] FDA and has been authorized for detection and/or diagnosis of SARS-CoV-2 by FDA under an Emergency Use Authorization (EUA). This EUA will remain in effect (meaning this test can be used) for the duration of the COVID-19 declaration under Section 564(b)(1) of the Act, 21 U.S.C. section 360bbb-3(b)(1), unless the authorization is terminated or revoked.  Performed at [facility] [address] [place], [place] [address]   Culture, blood (routine x 2)     Status: None (Preliminary result)   Collection Time: 06/01/21  6:02 AM   Specimen: BLOOD RIGHT HAND  Result Value Ref Range Status   Specimen Description BLOOD RIGHT HAND  Final   Special Requests   Final    AEROBIC BOTTLE ONLY Blood Culture results may not be optimal due to an inadequate volume of blood received in culture bottles   Culture   Final    NO GROWTH 2 DAYS Performed at [facility], [address], [place], [place] [address]    Report Status PENDING  Incomplete  Culture, blood (routine x 2)     Status: None (Preliminary result)   Collection Time: 06/01/21  6:02 AM   Specimen: BLOOD LEFT HAND  Result Value Ref Range Status   Specimen Description BLOOD LEFT HAND  Final   Special Requests   Final     AEROBIC BOTTLE ONLY Blood Culture results may not be optimal due to an inadequate volume of blood received in culture bottles   Culture   Final    NO GROWTH 2 DAYS Performed at [facility], [address], [place], [place] [address]    Report Status PENDING  Incomplete      Studies: No results found.  Scheduled Meds:  feeding supplement (GLUCERNA SHAKE)  237 mL Oral BID BM   insulin aspart  0-9 Units Subcutaneous TID WC   morphine  30 mg Oral BID   multivitamin with minerals  1 tablet Oral Daily   Ensure Max Protein  11 oz Oral QHS   senna-docusate  2 tablet Oral Daily    Continuous Infusions:   ceFAZolin (ANCEF) IV 2 g (06/03/21 0912)     LOS: 6 days     Darlin Drop, MD [facility] Pager [phone]  If 7PM-7AM, please contact night-coverage [email or web address] Password [email or web address] 06/03/2021, 12:06 PM

## 2021-06-03 NOTE — Plan of Care (Signed)
  Problem: Education: Goal: Knowledge of General Education information will improve Description Including pain rating scale, medication(s)/side effects and non-pharmacologic comfort measures Outcome: Progressing   Problem: Health Behavior/Discharge Planning: Goal: Ability to manage health-related needs will improve Outcome: Progressing   

## 2021-06-03 NOTE — Plan of Care (Signed)

## 2021-06-04 ENCOUNTER — Other Ambulatory Visit: Payer: Self-pay | Admitting: Neurological Surgery

## 2021-06-04 DIAGNOSIS — M4620 Osteomyelitis of vertebra, site unspecified: Secondary | ICD-10-CM | POA: Diagnosis not present

## 2021-06-04 LAB — CBC
HCT: 37.2 % — ABNORMAL LOW (ref 39.0–52.0)
Hemoglobin: 12.3 g/dL — ABNORMAL LOW (ref 13.0–17.0)
MCH: 26.6 pg (ref 26.0–34.0)
MCHC: 33.1 g/dL (ref 30.0–36.0)
MCV: 80.3 fL (ref 80.0–100.0)
Platelets: 445 10*3/uL — ABNORMAL HIGH (ref 150–400)
RBC: 4.63 MIL/uL (ref 4.22–5.81)
RDW: 16.1 % — ABNORMAL HIGH (ref 11.5–15.5)
WBC: 9.2 10*3/uL (ref 4.0–10.5)
nRBC: 0 % (ref 0.0–0.2)

## 2021-06-04 LAB — GLUCOSE, CAPILLARY
Glucose-Capillary: 169 mg/dL — ABNORMAL HIGH (ref 70–99)
Glucose-Capillary: 181 mg/dL — ABNORMAL HIGH (ref 70–99)
Glucose-Capillary: 183 mg/dL — ABNORMAL HIGH (ref 70–99)
Glucose-Capillary: 192 mg/dL — ABNORMAL HIGH (ref 70–99)

## 2021-06-04 LAB — BASIC METABOLIC PANEL
Anion gap: 10 (ref 5–15)
BUN: 11 mg/dL (ref 6–20)
CO2: 27 mmol/L (ref 22–32)
Calcium: 9.3 mg/dL (ref 8.9–10.3)
Chloride: 93 mmol/L — ABNORMAL LOW (ref 98–111)
Creatinine, Ser: 0.57 mg/dL — ABNORMAL LOW (ref 0.61–1.24)
GFR, Estimated: >60 mL/min (ref >60)
Glucose, Bld: 209 mg/dL — ABNORMAL HIGH (ref 70–99)
Potassium: 4.5 mmol/L (ref 3.5–5.1)
Sodium: 130 mmol/L — ABNORMAL LOW (ref 135–145)

## 2021-06-04 LAB — MAGNESIUM: Magnesium: 1.8 mg/dL (ref 1.7–2.4)

## 2021-06-04 MED ORDER — PROSOURCE PLUS PO LIQD
30.0000 mL | Freq: Two times a day (BID) | ORAL | Status: DC
  Filled 2021-06-04 (×2): qty 30

## 2021-06-04 MED ORDER — INSULIN GLARGINE-YFGN 100 UNIT/ML ~~LOC~~ SOLN
8.0000 [IU] | Freq: Every day | SUBCUTANEOUS | Status: DC
  Administered 2021-06-04 – 2021-06-11 (×8): 8 [IU] via SUBCUTANEOUS
  Filled 2021-06-04 (×9): qty 0.08

## 2021-06-04 NOTE — Progress Notes (Signed)
Inpatient Diabetes Program Recommendations  AACE/ADA: New Consensus Statement on Inpatient Glycemic Control (2015)  Target Ranges:  Prepandial:   less than 140 mg/dL      Peak postprandial:   less than 180 mg/dL (1-2 hours)      Critically ill patients:  140 - 180 mg/dL   Lab Results  Component Value Date   GLUCAP 181 (H) 06/04/2021   HGBA1C 8.4 (H) 05/28/2021    Review of Glycemic Control Results for BREAKER, SPRINGER (MRN [id number]) as of 06/04/2021 14:26  Ref. Range 06/03/2021 11:57 06/03/2021 16:12 06/03/2021 22:11 06/04/2021 08:31 06/04/2021 12:19  Glucose-Capillary Latest Ref Range: 70 - 99 mg/dL 440 (H) 347 (H) 425 (H) 169 (H) 181 (H)   Diabetes history: DM 2 Outpatient Diabetes medications:  Metformin 1500 mg with breakfast Current orders for Inpatient glycemic control:  Novolog sensitive tid with meals  Inpatient Diabetes Program Recommendations:    May consider adding Semglee 8 units daily while in the hospital.   Thanks,  Beryl Meager, RN, BC-ADM Inpatient Diabetes Coordinator Pager [phone]  (8a-5p)

## 2021-06-04 NOTE — Progress Notes (Signed)
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  He completed course of IV antibiotics and was transitioned to oral antibiotic.  He presented on 05/28/21 with worsening back pain and focal neurologic deficit, left lower extremity weakness.  Work-up revealed lumbar spine MRI with ongoing discitis/osteomyelitis at L1-L2 and L2-L3.  Epidural phlegmon along the posterior endplate centered at L1 with severe spinal canal stenosis with compression of cauda equina nerve roots.  Decreased size and number of bilateral psoas abscesses with small residual abscess bilaterally.  Diffuse signal abnormality throughout the psoas and paraspinal musculature consistent with inflammatory myositis.   Patient was transferred to [facility] for further neurosurgery evaluation.  Seen by infectious disease.  Repeated blood culture (05/28/21) positive for MSSA 1 out of 2 bottles.  Repeated blood cultures on 06/01/2021, no growth today.  06/04/2021: Pain well controlled on current regimen.  Pain level 3 out of 10 this morning.  Patient reports to neurosurgery increasing weakness in the left leg and now affecting the right side.  Plan for surgical intervention to decompress the left-sided L1-2 and L2-3.  N.p.o. after midnight.    Assessment/Plan: Principal Problem:   Infection of spine (HCC) Active Problems:   Depression, recurrent (HCC)   Diabetes mellitus (HCC)   Diabetic polyneuropathy associated with type 2 diabetes mellitus (HCC)   HTN (hypertension)   Endocarditis   Psoas abscess (HCC)   L1-L2 and L2-L3 osteomyelitis, POA, complicated with compression of cauda equina nerve roots. Left hip pain. Left lower extremity weakness. Patient with persistent back pain and left lower extremity proximal weakness. Continue cefazolin as recommended  by infectious disease Continue pain management in place with IV Dilaudid for severe pain, p.o. morphine oxycodone for moderate pain Continue to wean off IV Dilaudid as tolerated. He is also on as needed gabapentin and as needed Robaxin CRP 5.1, on 05/29/2021 Sed rate 77 on 04/09/2021, sed rate 44 on 05/31/2021. Appreciate neurosurgery and infectious disease's assistance.  Patient reports to neurosurgery increasing weakness in the left leg and now affecting the right side.  Plan for surgical intervention to decompress the left-sided L1-2 and L2-3.  N.p.o. after midnight.    MSSA bacteremia 1 out of 2 bottles positive for MSSA, collected on 05/28/2021. Blood cultures repeated on 06/01/2021 repeated blood cultures negative to date.   Continue cefazolin per infectious disease recommendations. Afebrile with no leukocytosis. Will need negative blood cultures > 48 hours prior to PICC line insertion. Seen by infectious disease, okay to place PICC line and continue cefazolin.  Left lower extremity weakness in the setting of osteomyelitis discitis at L1-2 and L2-3 with epidural phlegmon PT OT assessment, recommended no OT or PT follow-up. Continue fall precautions Rest of management per neurosurgery Surgical intervention planned tomorrow   Type 2 diabetes mellitus with hyperglycemia.   Hemoglobin A1c 8.4 on 05/28/2021.   Hold off home oral hypoglycemics, metformin and empagliflozin.  Continue with insulin sliding scale for glucose coverage and monitoring. Semglee 8 units daily started on 06/04/2021. Diabetes coordinator assisting with management, appreciate assistance.   Diabetic neuropathy.   Continue gabapentin and methocarbamol as needed.   Iron deficiency anemia, stable. Hgb is 12.0 and hct is 37.5  Iron studies done on 02/21/2021, showing iron deficiency. Hgb has been stable, patient not taking iron supplementation   HTN.   BP  stable.   Continue to hold on antihypertensive medications to  avoid hypotension.   Generalized weakness PT OT assessment Fall precautions     Patient continue to be at high risk for worsening neurologic deficit.    Status is: Inpatient   Remains inpatient appropriate because:Inpatient level of care appropriate due to severity of illness   Dispo: The patient is from: Home              Anticipated d/c is to: Home              Patient currently is not medically stable to d/c.              Difficult to place patient No   DVT prophylaxis:      Scds   Code Status:               full  Family Communication:        No family at the bedside         Consultants:  ID Neurosurgery        Antimicrobials:  Cefazolin      Code Status: Full code  Family Communication: None at bedside  Disposition Plan: Possibly CIR or home with home health services once neurosurgery signs off.   Consultants: Neurosurgery Infectious disease  Procedures: None  Antimicrobials: Cefazolin  DVT prophylaxis: SCDs  Status is: Inpatient    Dispo: The patient is from: Home.              Anticipated d/c is to: Home after infectious disease and neurosurgery sign off.              Patient currently medically not stable for discharge.   Difficult to place patient not applicable.        Objective: Vitals:   06/03/21 2100 06/04/21 0700 06/04/21 0742 06/04/21 1522  BP: 125/88 98/71 98/71  121/78  Pulse: 85 69 69 81  Resp: 16 16 16 16   Temp: 99.3 F (37.4 C) 99 F (37.2 C) 99 F (37.2 C) 98.5 F (36.9 C)  TempSrc: Oral Oral Oral Oral  SpO2: 97% 95% 95% 98%  Weight:      Height:        Intake/Output Summary (Last 24 hours) at 06/04/2021 1631 Last data filed at 06/04/2021 1259 Gross per 24 hour  Intake 1900 ml  Output 900 ml  Net 1000 ml   Filed Weights   05/29/21 1756  Weight: 77.1 kg    Exam:  General: 55 y.o. year-old male frail-appearing no acute distress.  He is alert and oriented x3. Cardiovascular: Regular rate and rhythm no  rubs or gallops.  Respiratory: Clear to auscultation with no wheezes or rales. Abdomen: Soft Nontender Normal Bowel Sounds Present. Musculoskeletal: No lower extremity edema bilaterally. Skin: No ulcerative lesions noted.   Psychiatry: Mood is appropriate for condition and setting.   Data Reviewed: CBC: Recent Labs  Lab 05/28/21 1948 05/29/21 0404 06/01/21 0600 06/04/21 0519  WBC 6.8 6.9 8.6 9.2  HGB 12.4* 12.0* 12.8* 12.3*  HCT 38.7* 37.5* 39.3 37.2*  MCV 82.9 83.0 81.2 80.3  PLT 411* 347 389 445*   Basic Metabolic Panel: Recent Labs  Lab 05/28/21 1948 05/29/21 0404 06/01/21 0600 06/04/21 0519  NA  --  136 132* 130*  K  --  4.2 4.5 4.5  CL  --  99 95* 93*  CO2  --  30 26 27   GLUCOSE  --  211* 179* 209*  BUN  --  12 12 11   CREATININE 0.55* 0.59* 0.52* 0.57*  CALCIUM  --  9.4 9.4 9.3  MG  --   --  1.7 1.8  PHOS  --   --  3.9  --    GFR: Estimated Creatinine Clearance: 110.3 mL/min (A) (by C-G formula based on SCr of 0.57 mg/dL (L)). Liver Function Tests: Recent Labs  Lab 06/01/21 0600  AST 18  ALT 11  ALKPHOS 106  BILITOT 0.6  PROT 7.3  ALBUMIN 2.9*   No results for input(s): LIPASE, AMYLASE in the last 168 hours. No results for input(s): AMMONIA in the last 168 hours. Coagulation Profile: No results for input(s): INR, PROTIME in the last 168 hours. Cardiac Enzymes: No results for input(s): CKTOTAL, CKMB, CKMBINDEX, TROPONINI in the last 168 hours. BNP (last 3 results) No results for input(s): PROBNP in the last 8760 hours. HbA1C: No results for input(s): HGBA1C in the last 72 hours.  CBG: Recent Labs  Lab 06/03/21 1157 06/03/21 1612 06/03/21 2211 06/04/21 0831 06/04/21 1219  GLUCAP 188* 237* 196* 169* 181*   Lipid Profile: No results for input(s): CHOL, HDL, LDLCALC, TRIG, CHOLHDL, LDLDIRECT in the last 72 hours. Thyroid Function Tests: No results for input(s): TSH, T4TOTAL, FREET4, T3FREE, THYROIDAB in the last 72 hours. Anemia Panel: No  results for input(s): VITAMINB12, FOLATE, FERRITIN, TIBC, IRON, RETICCTPCT in the last 72 hours. Urine analysis:    Component Value Date/Time   COLORURINE COLORLESS (A) 05/28/2021 1353   APPEARANCEUR CLEAR 05/28/2021 1353   LABSPEC 1.002 (L) 05/28/2021 1353   PHURINE 8.0 05/28/2021 1353   GLUCOSEU NEGATIVE 05/28/2021 1353   HGBUR MODERATE (A) 05/28/2021 1353   BILIRUBINUR NEGATIVE 05/28/2021 1353   KETONESUR NEGATIVE 05/28/2021 1353   PROTEINUR NEGATIVE 05/28/2021 1353   NITRITE NEGATIVE 05/28/2021 1353   LEUKOCYTESUR NEGATIVE 05/28/2021 1353   Sepsis Labs: @LABRCNTIP (procalcitonin:4,lacticidven:4)  ) Recent Results (from the past 240 hour(s))  Culture, blood (Routine X 2) w Reflex to ID Panel     Status: None   Collection Time: 05/28/21  5:20 PM   Specimen: BLOOD  Result Value Ref Range Status   Specimen Description   Final    BLOOD LEFT ANTECUBITAL Performed at [facility], [address], [place], [place]    Special Requests   Final    BOTTLES DRAWN AEROBIC AND ANAEROBIC Blood Culture adequate volume Performed at [facility], [address] [place], [place]    Culture   Final    NO GROWTH 5 DAYS Performed at [facility], [address], [place], [address] [place]    Report Status 06/02/2021 FINAL  Final  Culture, blood (Routine X 2) w Reflex to ID Panel     Status: Abnormal (Preliminary result)   Collection Time: 05/28/21  5:22 PM   Specimen: BLOOD  Result Value Ref Range Status   Specimen Description   Final    BLOOD RIGHT ANTECUBITAL Performed at [facility], [address], [place], [place]    Special Requests   Final    BOTTLES DRAWN AEROBIC AND ANAEROBIC Blood Culture results may not be optimal due to an excessive volume of blood received in culture bottles Performed at [facility], [address] [place], [place]    Culture  Setup  Time   Final    GRAM POSITIVE COCCI ANAEROBIC BOTTLE ONLY CRITICAL RESULT CALLED TO, READ BACK BY AND VERIFIED WITH: PHARMD  GREG ABBOTT 05/31/2021@2 :56 BY TW    Culture (A)  Final    STAPHYLOCOCCUS AUREUS REPEATING SENSITIVITY Performed at [facility], [address], [place], [place] [address]    Report Status PENDING  Incomplete  Blood Culture ID Panel (Reflexed)     Status: Abnormal   Collection Time: 05/28/21  5:22 PM  Result Value Ref Range Status   Enterococcus faecalis NOT DETECTED NOT DETECTED Final   Enterococcus Faecium NOT DETECTED NOT DETECTED Final   Listeria monocytogenes NOT DETECTED NOT DETECTED Final   Staphylococcus species DETECTED (A) NOT DETECTED Final    Comment: CRITICAL RESULT CALLED TO, READ BACK BY AND VERIFIED WITH: PHARMD GREG ABBOTT 05/31/2021@2 :56 BY TW    Staphylococcus aureus (BCID) DETECTED (A) NOT DETECTED Final    Comment: CRITICAL RESULT CALLED TO, READ BACK BY AND VERIFIED WITH: PHARMD GREG ABBOTT 05/31/2021@2 :56 BY TW    Staphylococcus epidermidis NOT DETECTED NOT DETECTED Final   Staphylococcus lugdunensis NOT DETECTED NOT DETECTED Final   Streptococcus species NOT DETECTED NOT DETECTED Final   Streptococcus agalactiae NOT DETECTED NOT DETECTED Final   Streptococcus pneumoniae NOT DETECTED NOT DETECTED Final   Streptococcus pyogenes NOT DETECTED NOT DETECTED Final   A.calcoaceticus-baumannii NOT DETECTED NOT DETECTED Final   Bacteroides fragilis NOT DETECTED NOT DETECTED Final   Enterobacterales NOT DETECTED NOT DETECTED Final   Enterobacter cloacae complex NOT DETECTED NOT DETECTED Final   Escherichia coli NOT DETECTED NOT DETECTED Final   Klebsiella aerogenes NOT DETECTED NOT DETECTED Final   Klebsiella oxytoca NOT DETECTED NOT DETECTED Final   Klebsiella pneumoniae NOT DETECTED NOT DETECTED Final   Proteus species NOT DETECTED NOT DETECTED Final   Salmonella species NOT DETECTED NOT DETECTED Final   Serratia marcescens NOT  DETECTED NOT DETECTED Final   Haemophilus influenzae NOT DETECTED NOT DETECTED Final   Neisseria meningitidis NOT DETECTED NOT DETECTED Final   Pseudomonas aeruginosa NOT DETECTED NOT DETECTED Final   Stenotrophomonas maltophilia NOT DETECTED NOT DETECTED Final   Candida albicans NOT DETECTED NOT DETECTED Final   Candida auris NOT DETECTED NOT DETECTED Final   Candida glabrata NOT DETECTED NOT DETECTED Final   Candida krusei NOT DETECTED NOT DETECTED Final   Candida parapsilosis NOT DETECTED NOT DETECTED Final   Candida tropicalis NOT DETECTED NOT DETECTED Final   Cryptococcus neoformans/gattii NOT DETECTED NOT DETECTED Final   Meth resistant mecA/C and MREJ NOT DETECTED NOT DETECTED Final    Comment: Performed at [facility], [address] [place], [place] [address]  Resp Panel by RT-PCR (Flu A&B, Covid) Nasopharyngeal Swab     Status: None   Collection Time: 05/28/21  5:30 PM   Specimen: Nasopharyngeal Swab; Nasopharyngeal(NP) swabs in vial transport medium  Result Value Ref Range Status   SARS Coronavirus 2 by RT PCR NEGATIVE NEGATIVE Final    Comment: (NOTE) SARS-CoV-2 target nucleic acids are NOT DETECTED.  The SARS-CoV-2 RNA is generally detectable in upper respiratory specimens during the acute phase of infection. The lowest concentration of SARS-CoV-2 viral copies this assay can detect is 138 copies/mL. A negative result does not preclude SARS-Cov-2 infection and should not be used as the sole basis for treatment or other patient management decisions. A negative result may occur with  improper specimen collection/handling, submission of specimen other than nasopharyngeal swab, presence of viral mutation(s) within the areas targeted by this assay, and inadequate number of viral copies(<138 copies/mL). A negative result must be combined with clinical observations, patient history,  and epidemiological information. The expected result is Negative.  Fact Sheet for  Patients:  BloggerCourse.com  Fact Sheet for Healthcare Providers:  SeriousBroker.it  This test is no t yet approved or cleared by the [place] FDA and  has been authorized for detection and/or diagnosis of SARS-CoV-2 by FDA under an Emergency Use Authorization (EUA). This EUA will remain  in effect (meaning this test can be used) for the duration of the COVID-19 declaration under Section 564(b)(1) of the Act, 21 U.S.C.section 360bbb-3(b)(1), unless the authorization is terminated  or revoked sooner.       Influenza A by PCR NEGATIVE NEGATIVE Final   Influenza B by PCR NEGATIVE NEGATIVE Final    Comment: (NOTE) The Xpert Xpress SARS-CoV-2/FLU/RSV plus assay is intended as an aid in the diagnosis of influenza from Nasopharyngeal swab specimens and should not be used as a sole basis for treatment. Nasal washings and aspirates are unacceptable for Xpert Xpress SARS-CoV-2/FLU/RSV testing.  Fact Sheet for Patients: BloggerCourse.com  Fact Sheet for Healthcare Providers: SeriousBroker.it  This test is not yet approved or cleared by the [place] FDA and has been authorized for detection and/or diagnosis of SARS-CoV-2 by FDA under an Emergency Use Authorization (EUA). This EUA will remain in effect (meaning this test can be used) for the duration of the COVID-19 declaration under Section 564(b)(1) of the Act, 21 U.S.C. section 360bbb-3(b)(1), unless the authorization is terminated or revoked.  Performed at [facility], [address], [place], [place] [address]   Culture, blood (routine x 2)     Status: None (Preliminary result)   Collection Time: 06/01/21  6:02 AM   Specimen: BLOOD RIGHT HAND  Result Value Ref Range Status   Specimen Description BLOOD RIGHT HAND  Final   Special Requests   Final    AEROBIC BOTTLE ONLY Blood Culture results may not be  optimal due to an inadequate volume of blood received in culture bottles   Culture   Final    NO GROWTH 3 DAYS Performed at [facility], [address] [place], [place] [address]    Report Status PENDING  Incomplete  Culture, blood (routine x 2)     Status: None (Preliminary result)   Collection Time: 06/01/21  6:02 AM   Specimen: BLOOD LEFT HAND  Result Value Ref Range Status   Specimen Description BLOOD LEFT HAND  Final   Special Requests   Final    AEROBIC BOTTLE ONLY Blood Culture results may not be optimal due to an inadequate volume of blood received in culture bottles   Culture   Final    NO GROWTH 3 DAYS Performed at [facility], [address], [place], [place] [address]    Report Status PENDING  Incomplete      Studies: No results found.  Scheduled Meds:  [START ON 06/05/2021] (feeding supplement) PROSource Plus  30 mL Oral BID BM   feeding supplement (GLUCERNA SHAKE)  237 mL Oral BID BM   insulin aspart  0-9 Units Subcutaneous TID WC   insulin glargine-yfgn  8 Units Subcutaneous Daily   morphine  30 mg Oral BID   multivitamin with minerals  1 tablet Oral Daily   Ensure Max Protein  11 oz Oral QHS   senna-docusate  2 tablet Oral Daily    Continuous Infusions:   ceFAZolin (ANCEF) IV 2 g (06/04/21 0908)     LOS: 7 days     Darlin Drop, MD  [facility] Pager [phone]  If 7PM-7AM, please contact night-coverage [email or web address] Password [email or web address] 06/04/2021, 4:31 PM

## 2021-06-04 NOTE — Progress Notes (Addendum)
[facility]  I have seen and examined the patient. I have personally reviewed the clinical findings, laboratory findings, microbiological data and imaging studies. The assessment and treatment plan was discussed with the Nurse Practitioner Rexene Alberts. I agree with her/his recommendations except following additions/corrections.  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  Exam Bilateral Upper extremity Power 5/5  Weakness in the left lower extremity proximally ( <3/5) more so than distal left lower extremity. He is not able to lift his left leg above the bed but able to lift his rt leg above the bed.  Bilateral shoulder surgical site/Lumbar surgical healing well No swelling and tenderness in other peripheral joints Tenderness + in the lumbar spine  Continue cefazolin as is. Repeat blood cx 10/7 no growth in 3 days. TTE negative for vegetations ( previously seen vegetation in TV no longer seen) Follow up Neurosurgery evaluation today. May need to request IR to drain above fluid collection if not undergoing for OR with Neurosurgery Monitor CBC and BMP Awaiting neurosurgical plans.   Odette Fraction, MD Infectious Disease Physician [facility] [address] [place], [place] [address] Phone: [phone]  Fax: [phone]   Date of Admission:   05/28/2021      Total days of antibiotics 8  Cefazolin 10/03 >> c         ASSESSMENT: John Ware is a 55 y.o. 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 Decrease in b/l psoas muscle abscesses but not resolved. L4-5 fluid collection persists and is unchanged in size compared to prior study in July. Also recurrently bacteremia d/t MSSA.   MSSA Bacteremia - no growth from repeated cultures > 48 h now. OK to place PICC line.   Lumbar Osteomyelitis L1-2, L2-3 with new epidural phlegmon - Dr. August following; no surgical indications at this time - planning re-evaluation today.  There is a mention of a fluid collection that is unchanged since July 2022 --> will reach out to get Dr. August 2022 opinion about this whether it is something we need to consider draining.   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 Mild TV regurgitation remains. No new changes to other valve structures or function. No need to pursue TEE    PLAN: ?if the persistent L4-5 fluid collection should be considered for IR drainage as ongoing nidus of infection - will reach out to Dr. September 2022 to see what his thoughts are.  OK to place PICC line anytime  Continue cefazolin. Follow for NSGY recs.     Principal Problem:   Infection of spine (HCC) Active Problems:   Depression, recurrent (HCC)   Diabetes mellitus (HCC)   Diabetic polyneuropathy associated with type 2 diabetes mellitus (HCC)  HTN (hypertension)   Endocarditis   Psoas abscess (HCC)    feeding supplement (GLUCERNA SHAKE)  237 mL Oral BID BM   insulin aspart  0-9 Units Subcutaneous TID WC   morphine  30 mg Oral BID   multivitamin with minerals  1 tablet Oral Daily   Ensure Max  Protein  11 oz Oral QHS   senna-docusate  2 tablet Oral Daily    SUBJECTIVE: Feels that the weakness is worse on the left side. Hard for him to get up and about himself at all anymore. No new concerns.  No changes in bowel/bladder function.    Review of Systems: Review of Systems  Constitutional:  Negative for chills and fever.  HENT:  Negative for tinnitus.   Eyes:  Negative for blurred vision and photophobia.  Respiratory:  Negative for cough and sputum production.   Cardiovascular:  Negative for chest pain.  Gastrointestinal:  Negative for diarrhea, nausea and vomiting.  Genitourinary:  Negative for dysuria.  Musculoskeletal:  Positive for back pain.  Skin:  Negative for rash.  Neurological:  Positive for focal weakness. Negative for headaches.   Allergies  Allergen Reactions   Tizanidine Other (See Comments)    Leg cramps    OBJECTIVE: Vitals:   06/03/21 1516 06/03/21 2100 06/04/21 0700 06/04/21 0742  BP: (!) 123/97 125/88 98/71 98/71   Pulse: 95 85 69 69  Resp: 17 16 16 16   Temp: 98 F (36.7 C) 99.3 F (37.4 C) 99 F (37.2 C) 99 F (37.2 C)  TempSrc: Oral Oral Oral Oral  SpO2: 95% 97% 95% 95%  Weight:      Height:       Body mass index is 24.39 kg/m.  Physical Exam Vitals reviewed.  Constitutional:      Appearance: Normal appearance. He is well-developed. He is not ill-appearing.     Comments: Resting comfortably in bed.   HENT:     Head: Normocephalic.     Mouth/Throat:     Mouth: Mucous membranes are moist.     Dentition: Normal dentition. No dental abscesses.     Pharynx: Oropharynx is clear.  Eyes:     General: No scleral icterus. Cardiovascular:     Rate and Rhythm: Normal rate and regular rhythm.     Heart sounds: Normal heart sounds.  Pulmonary:     Effort: Pulmonary effort is normal.     Breath sounds: Normal breath sounds.  Abdominal:     General: There is no distension.     Palpations: Abdomen is soft.     Tenderness: There is no  abdominal tenderness.  Musculoskeletal:        General: Normal range of motion.     Cervical back: Normal range of motion.  Lymphadenopathy:     Cervical: No cervical adenopathy.  Skin:    General: Skin is warm and dry.     Coloration: Skin is not jaundiced or pale.     Findings: No rash.  Neurological:     Mental Status: He is alert and oriented to person, place, and time.  Psychiatric:        Mood and Affect: Mood normal.        Judgment: Judgment normal.    Lab Results Lab Results  Component Value Date   WBC 9.2 06/04/2021   HGB 12.3 (L) 06/04/2021   HCT 37.2 (L) 06/04/2021   MCV 80.3 06/04/2021   PLT 445 (H) 06/04/2021    Lab Results  Component Value  Date   CREATININE 0.57 (L) 06/04/2021   BUN 11 06/04/2021   NA 130 (L) 06/04/2021   K 4.5 06/04/2021   CL 93 (L) 06/04/2021   CO2 27 06/04/2021    Lab Results  Component Value Date   ALT 11 06/01/2021   AST 18 06/01/2021   ALKPHOS 106 06/01/2021   BILITOT 0.6 06/01/2021     Microbiology: Recent Results (from the past 240 hour(s))  Culture, blood (Routine X 2) w Reflex to ID Panel     Status: None   Collection Time: 05/28/21  5:20 PM   Specimen: BLOOD  Result Value Ref Range Status   Specimen Description   Final    BLOOD LEFT ANTECUBITAL Performed at [facility], [address] [place], [place] [address]    Special Requests   Final    BOTTLES DRAWN AEROBIC AND ANAEROBIC Blood Culture adequate volume Performed at [facility], [address], [place], [place] [address]    Culture   Final    NO GROWTH 5 DAYS Performed at [facility], [address] [place], [place] [address]    Report Status 06/02/2021 FINAL  Final  Culture, blood (Routine X 2) w Reflex to ID Panel     Status: Abnormal (Preliminary result)   Collection Time: 05/28/21  5:22 PM   Specimen: BLOOD  Result Value Ref Range Status   Specimen Description   Final    BLOOD RIGHT  ANTECUBITAL Performed at [facility], [address] [place], [place] [address]    Special Requests   Final    BOTTLES DRAWN AEROBIC AND ANAEROBIC Blood Culture results may not be optimal due to an excessive volume of blood received in culture bottles Performed at [facility], [address], [place], [place] [address]    Culture  Setup Time   Final    GRAM POSITIVE COCCI ANAEROBIC BOTTLE ONLY CRITICAL RESULT CALLED TO, READ BACK BY AND VERIFIED WITH: PHARMD GREG ABBOTT 05/31/2021@2 :56 BY TW    Culture (A)  Final    STAPHYLOCOCCUS AUREUS SUSCEPTIBILITIES TO FOLLOW Performed at [facility], [address] [place], [place] [address]    Report Status PENDING  Incomplete  Blood Culture ID Panel (Reflexed)     Status: Abnormal   Collection Time: 05/28/21  5:22 PM  Result Value Ref Range Status   Enterococcus faecalis NOT DETECTED NOT DETECTED Final   Enterococcus Faecium NOT DETECTED NOT DETECTED Final   Listeria monocytogenes NOT DETECTED NOT DETECTED Final   Staphylococcus species DETECTED (A) NOT DETECTED Final    Comment: CRITICAL RESULT CALLED TO, READ BACK BY AND VERIFIED WITH: PHARMD GREG ABBOTT 05/31/2021@2 :56 BY TW    Staphylococcus aureus (BCID) DETECTED (A) NOT DETECTED Final    Comment: CRITICAL RESULT CALLED TO, READ BACK BY AND VERIFIED WITH: PHARMD GREG ABBOTT 05/31/2021@2 :56 BY TW    Staphylococcus epidermidis NOT DETECTED NOT DETECTED Final   Staphylococcus lugdunensis NOT DETECTED NOT DETECTED Final   Streptococcus species NOT DETECTED NOT DETECTED Final   Streptococcus agalactiae NOT DETECTED NOT DETECTED Final   Streptococcus pneumoniae NOT DETECTED NOT DETECTED Final   Streptococcus pyogenes NOT DETECTED NOT DETECTED Final   A.calcoaceticus-baumannii NOT DETECTED NOT DETECTED Final   Bacteroides fragilis NOT DETECTED NOT DETECTED Final   Enterobacterales NOT DETECTED NOT DETECTED Final   Enterobacter cloacae  complex NOT DETECTED NOT DETECTED Final   Escherichia coli NOT DETECTED NOT DETECTED Final  Klebsiella aerogenes NOT DETECTED NOT DETECTED Final   Klebsiella oxytoca NOT DETECTED NOT DETECTED Final   Klebsiella pneumoniae NOT DETECTED NOT DETECTED Final   Proteus species NOT DETECTED NOT DETECTED Final   Salmonella species NOT DETECTED NOT DETECTED Final   Serratia marcescens NOT DETECTED NOT DETECTED Final   Haemophilus influenzae NOT DETECTED NOT DETECTED Final   Neisseria meningitidis NOT DETECTED NOT DETECTED Final   Pseudomonas aeruginosa NOT DETECTED NOT DETECTED Final   Stenotrophomonas maltophilia NOT DETECTED NOT DETECTED Final   Candida albicans NOT DETECTED NOT DETECTED Final   Candida auris NOT DETECTED NOT DETECTED Final   Candida glabrata NOT DETECTED NOT DETECTED Final   Candida krusei NOT DETECTED NOT DETECTED Final   Candida parapsilosis NOT DETECTED NOT DETECTED Final   Candida tropicalis NOT DETECTED NOT DETECTED Final   Cryptococcus neoformans/gattii NOT DETECTED NOT DETECTED Final   Meth resistant mecA/C and MREJ NOT DETECTED NOT DETECTED Final    Comment: Performed at [facility], [address] [place], [place] [address]  Resp Panel by RT-PCR (Flu A&B, Covid) Nasopharyngeal Swab     Status: None   Collection Time: 05/28/21  5:30 PM   Specimen: Nasopharyngeal Swab; Nasopharyngeal(NP) swabs in vial transport medium  Result Value Ref Range Status   SARS Coronavirus 2 by RT PCR NEGATIVE NEGATIVE Final    Comment: (NOTE) SARS-CoV-2 target nucleic acids are NOT DETECTED.  The SARS-CoV-2 RNA is generally detectable in upper respiratory specimens during the acute phase of infection. The lowest concentration of SARS-CoV-2 viral copies this assay can detect is 138 copies/mL. A negative result does not preclude SARS-Cov-2 infection and should not be used as the sole basis for treatment or other patient management decisions. A negative result may occur with   improper specimen collection/handling, submission of specimen other than nasopharyngeal swab, presence of viral mutation(s) within the areas targeted by this assay, and inadequate number of viral copies(<138 copies/mL). A negative result must be combined with clinical observations, patient history, and epidemiological information. The expected result is Negative.  Fact Sheet for Patients:  BloggerCourse.com  Fact Sheet for Healthcare Providers:  SeriousBroker.it  This test is no t yet approved or cleared by the [place] FDA and  has been authorized for detection and/or diagnosis of SARS-CoV-2 by FDA under an Emergency Use Authorization (EUA). This EUA will remain  in effect (meaning this test can be used) for the duration of the COVID-19 declaration under Section 564(b)(1) of the Act, 21 U.S.C.section 360bbb-3(b)(1), unless the authorization is terminated  or revoked sooner.       Influenza A by PCR NEGATIVE NEGATIVE Final   Influenza B by PCR NEGATIVE NEGATIVE Final    Comment: (NOTE) The Xpert Xpress SARS-CoV-2/FLU/RSV plus assay is intended as an aid in the diagnosis of influenza from Nasopharyngeal swab specimens and should not be used as a sole basis for treatment. Nasal washings and aspirates are unacceptable for Xpert Xpress SARS-CoV-2/FLU/RSV testing.  Fact Sheet for Patients: BloggerCourse.com  Fact Sheet for Healthcare Providers: SeriousBroker.it  This test is not yet approved or cleared by the [place] FDA and has been authorized for detection and/or diagnosis of SARS-CoV-2 by FDA under an Emergency Use Authorization (EUA). This EUA will remain in effect (meaning this test can be used) for the duration of the COVID-19 declaration under Section 564(b)(1) of the Act, 21 U.S.C. section 360bbb-3(b)(1), unless the authorization is terminated  or revoked.  Performed at [facility], [address] [place], [place] [address]   Culture, blood (routine x 2)     Status: None (Preliminary result)   Collection Time: 06/01/21  6:02 AM   Specimen: BLOOD RIGHT HAND  Result Value Ref Range Status   Specimen Description BLOOD RIGHT HAND  Final   Special Requests   Final    AEROBIC BOTTLE ONLY Blood Culture results may not be optimal due to an inadequate volume of blood received in culture bottles   Culture   Final    NO GROWTH 3 DAYS Performed at [facility], [address] [place], [place] [address]    Report Status PENDING  Incomplete  Culture, blood (routine x 2)     Status: None (Preliminary result)   Collection Time: 06/01/21  6:02 AM   Specimen: BLOOD LEFT HAND  Result Value Ref Range Status   Specimen Description BLOOD LEFT HAND  Final   Special Requests   Final    AEROBIC BOTTLE ONLY Blood Culture results may not be optimal due to an inadequate volume of blood received in culture bottles   Culture   Final    NO GROWTH 3 DAYS Performed at [facility], [address], [place], [place] [address]    Report Status PENDING  Incomplete    Rexene Alberts, MSN, NP-C [facility] Pager: [phone]  06/04/2021  10:12 AM

## 2021-06-04 NOTE — Progress Notes (Signed)
Inpatient Rehab Admissions Coordinator Note:   Per PT patient was screened for CIR candidacy by Jemila Camille Luvenia Starch, CCC-SLP. At this time, pt appears to be a potential candidate for CIR. I will place an order for rehab consult for full assessment, per our protocol.  Please contact me any with questions.Wolfgang Phoenix, MS, CCC-SLP Admissions Coordinator [phone] 06/04/21 5:45 PM

## 2021-06-04 NOTE — Progress Notes (Signed)
Physical Therapy Treatment Patient Details Name: John Ware MRN: [id number] DOB: 08-13-1966 Today's Date: 06/04/2021   History of Present Illness Patient is a 55 y/o male who presents on 05/28/21 with LLE weakness and pain. 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 PMH includes HTN, chronic pain, DM, COVID, MSSA bacteremia, depression.    PT Comments    Patient not progressing with mobility today due to worsening weakness now on RLE as well as LLE. Also noted to have decreased sensation right proximal anterolateral thigh, which is new. Pt now requires mod A to stand from EOB and heavy use of UEs for support on RW to prevent knee buckling. Tolerated marching in standing with Min guard assist. More assist needed for bed mobility as well. Pt reports this worsening weakness since yesterday. Discharge recommendation updated to CIR as pt far from functional baseline and barely able to walk a few feet without knees giving out/pain. Will follow acutely.   Recommendations for follow up therapy are one component of a multi-disciplinary discharge planning process, led by the attending physician.  Recommendations may be updated based on patient status, additional functional criteria and insurance authorization.  Follow Up Recommendations  CIR;Supervision for mobility/OOB     Equipment Recommendations  Rolling walker with 5" wheels;Wheelchair (measurements PT);Wheelchair cushion (measurements PT) (might be able to borrow a w/c)    Recommendations for Other Services       Precautions / Restrictions Precautions Precautions: Fall;Back Precaution Booklet Issued: No Precaution Comments: Use of log roll for comfort Restrictions Weight Bearing Restrictions: No     Mobility  Bed  Mobility Overal bed mobility: Needs Assistance Bed Mobility: Rolling;Sidelying to Sit Rolling: Supervision Sidelying to sit: Min guard;HOB elevated       General bed mobility comments: Increased effort/difficulty with heavy use of rail and manually using UEs to bring BLEs to EOB.    Transfers Overall transfer level: Needs assistance Equipment used: Rolling walker (2 wheeled) Transfers: Sit to/from Stand Sit to Stand: Mod assist;From elevated surface         General transfer comment: Assist to power to standing with difficulty in mid range due to weakness bilaterally. Heavy use of UEs upon standing on RW.  Ambulation/Gait             General Gait Details: Deferred as pt just returned from bathroom stating he barely made it and in pain.   Stairs             Wheelchair Mobility    Modified Rankin (Stroke Patients Only)       Balance Overall balance assessment: Needs assistance Sitting-balance support: Feet supported;No upper extremity supported Sitting balance-[name]: Good Sitting balance - Comments: Leaning forward on legs to stretch out back reporting comfort and ease of pain.   Standing balance support: During functional activity Standing balance-[name]: Poor Standing balance comment: Requires Ue support in standing, locking out LEs during weight bearing to prevent buckling; marching in place with heavy reliance on UEs                            Cognition Arousal/Alertness: Awake/alert Behavior During Therapy: WFL for tasks assessed/performed Overall Cognitive Status: Within Functional Limits for tasks assessed  Exercises      General Comments General comments (skin integrity, edema, etc.): Worsening weakness now on RLE- progressed from Friday. Hip flexors 2/5, knee extension 2-/5, 4/5 DF, 5/5 PF and decreased light touch sensation proximal lateral right thigh.       Pertinent Vitals/Pain Pain Assessment: Faces Faces Pain Scale: Hurts even more Pain Location: back, hips Pain Descriptors / Indicators: Constant;Discomfort;Sore;Guarding Pain Intervention(s): Monitored during session;Repositioned;Premedicated before session;Limited activity within patient's tolerance    Home Living                      Prior Function            PT Goals (current goals can now be found in the care plan section) Progress towards PT goals: Not progressing toward goals - comment (due to worsening weakness)    Frequency    Min 3X/week      PT Plan Discharge plan needs to be updated    Co-evaluation              AM-PAC PT "6 Clicks" Mobility   Outcome Measure  Help needed turning from your back to your side while in a flat bed without using bedrails?: A Little Help needed moving from lying on your back to sitting on the side of a flat bed without using bedrails?: A Little Help needed moving to and from a bed to a chair (including a wheelchair)?: A Lot Help needed standing up from a chair using your arms (e.g., wheelchair or bedside chair)?: A Lot Help needed to walk in hospital room?: A Little Help needed climbing 3-5 steps with a railing? : A Lot 6 Click Score: 15    End of Session Equipment Utilized During Treatment: Gait belt Activity Tolerance: Patient limited by pain Patient left: in bed;with call bell/phone within reach;with bed alarm set (sitting EOB) Nurse Communication: Mobility status PT Visit Diagnosis: Pain;Muscle weakness (generalized) (M62.81) Pain - part of body:  (back/hips)     Time: 6503-5465 PT Time Calculation (min) (ACUTE ONLY): 16 min  Charges:  $Therapeutic Activity: 8-22 mins                     Vale Haven, PT, DPT Acute Rehabilitation Services Pager [phone] Office [phone]      Blake Divine A Carliyah Cotterman 06/04/2021, 11:19 AM

## 2021-06-04 NOTE — Progress Notes (Signed)
Patient ID: John Ware, male   DOB: 1966-02-17, 55 y.o.   MRN: [id number] Vital signs are stable Patient complaining of increasing weakness in his left leg and out he feels it is affecting his right side.  On examination I note that he has trace iliopsoas and quadricep function on that left side.  Tibialis anterior and gastroc still remains strong.  Right-sided strength is 4 out of 5 and I do not appreciate a significant difference.  I discussed with [name] consideration of surgical intervention to decompress the left-sided L1-2 and L2-3.  He had a significant phlegmon with a substantial stenosis at that level.  Given the fact that he has been on antibiotics all this time and though he is afebrile he does not appear to be clinically responding with his neurologic function we will plan a surgical decompression of these 2 levels.

## 2021-06-05 ENCOUNTER — Inpatient Hospital Stay (HOSPITAL_COMMUNITY): Payer: Commercial Managed Care - PPO

## 2021-06-05 ENCOUNTER — Inpatient Hospital Stay (HOSPITAL_COMMUNITY): Payer: Commercial Managed Care - PPO | Admitting: Anesthesiology

## 2021-06-05 ENCOUNTER — Encounter (HOSPITAL_COMMUNITY): Payer: Self-pay | Admitting: Internal Medicine

## 2021-06-05 ENCOUNTER — Encounter (HOSPITAL_COMMUNITY)
Admission: EM | Disposition: A | Discharge status: Rehabilitation Facility | Payer: Self-pay | Source: Home / Self Care | Attending: Internal Medicine

## 2021-06-05 DIAGNOSIS — M4620 Osteomyelitis of vertebra, site unspecified: Secondary | ICD-10-CM | POA: Diagnosis not present

## 2021-06-05 HISTORY — PX: LUMBAR LAMINECTOMY/DECOMPRESSION MICRODISCECTOMY: SHX5026

## 2021-06-05 LAB — CBC
HCT: 36.6 % — ABNORMAL LOW (ref 39.0–52.0)
Hemoglobin: 11.8 g/dL — ABNORMAL LOW (ref 13.0–17.0)
MCH: 26.1 pg (ref 26.0–34.0)
MCHC: 32.2 g/dL (ref 30.0–36.0)
MCV: 81 fL (ref 80.0–100.0)
Platelets: 472 10*3/uL — ABNORMAL HIGH (ref 150–400)
RBC: 4.52 MIL/uL (ref 4.22–5.81)
RDW: 15.9 % — ABNORMAL HIGH (ref 11.5–15.5)
WBC: 10.9 10*3/uL — ABNORMAL HIGH (ref 4.0–10.5)
nRBC: 0 % (ref 0.0–0.2)

## 2021-06-05 LAB — BASIC METABOLIC PANEL
Anion gap: 7 (ref 5–15)
BUN: 15 mg/dL (ref 6–20)
CO2: 29 mmol/L (ref 22–32)
Calcium: 9.2 mg/dL (ref 8.9–10.3)
Chloride: 94 mmol/L — ABNORMAL LOW (ref 98–111)
Creatinine, Ser: 0.51 mg/dL — ABNORMAL LOW (ref 0.61–1.24)
GFR, Estimated: >60 mL/min (ref >60)
Glucose, Bld: 159 mg/dL — ABNORMAL HIGH (ref 70–99)
Potassium: 4.4 mmol/L (ref 3.5–5.1)
Sodium: 130 mmol/L — ABNORMAL LOW (ref 135–145)

## 2021-06-05 LAB — PHOSPHORUS: Phosphorus: 4.4 mg/dL (ref 2.5–4.6)

## 2021-06-05 LAB — GLUCOSE, CAPILLARY
Glucose-Capillary: 136 mg/dL — ABNORMAL HIGH (ref 70–99)
Glucose-Capillary: 162 mg/dL — ABNORMAL HIGH (ref 70–99)
Glucose-Capillary: 104 mg/dL — ABNORMAL HIGH (ref 70–99)
Glucose-Capillary: 174 mg/dL — ABNORMAL HIGH (ref 70–99)
Glucose-Capillary: 181 mg/dL — ABNORMAL HIGH (ref 70–99)

## 2021-06-05 LAB — MAGNESIUM: Magnesium: 1.7 mg/dL (ref 1.7–2.4)

## 2021-06-05 LAB — SURGICAL PCR SCREEN
MRSA, PCR: NEGATIVE
Staphylococcus aureus: POSITIVE — AB

## 2021-06-05 SURGERY — LUMBAR LAMINECTOMY/DECOMPRESSION MICRODISCECTOMY 2 LEVELS
Anesthesia: General | Site: Back | Laterality: Left

## 2021-06-05 MED ORDER — ORAL CARE MOUTH RINSE: 15.0000 mL | Freq: Once | OROMUCOSAL | Status: AC

## 2021-06-05 MED ORDER — DOCUSATE SODIUM 100 MG PO CAPS: 100.0000 mg | ORAL_CAPSULE | Freq: Two times a day (BID) | ORAL | Status: DC

## 2021-06-05 MED ORDER — THROMBIN 5000 UNITS EX SOLR
OROMUCOSAL | Status: DC | PRN
  Administered 2021-06-05: 5 mL via TOPICAL

## 2021-06-05 MED ORDER — ACETAMINOPHEN 650 MG RE SUPP: 650.0000 mg | RECTAL | Status: DC | PRN

## 2021-06-05 MED ORDER — LIDOCAINE-EPINEPHRINE 1 %-1:100000 IJ SOLN
INTRAMUSCULAR | Status: DC | PRN
  Administered 2021-06-05: 5 mL

## 2021-06-05 MED ORDER — ACETAMINOPHEN 325 MG PO TABS
650.0000 mg | ORAL_TABLET | ORAL | Status: DC | PRN
  Administered 2021-06-09 – 2021-06-10 (×4): 650 mg via ORAL
  Filled 2021-06-05 (×4): qty 2

## 2021-06-05 MED ORDER — POLYETHYLENE GLYCOL 3350 17 G PO PACK: 17.0000 g | PACK | Freq: Every day | ORAL | Status: DC | PRN

## 2021-06-05 MED ORDER — FENTANYL CITRATE (PF) 100 MCG/2ML IJ SOLN
25.0000 ug | INTRAMUSCULAR | Status: DC | PRN
  Administered 2021-06-05 (×2): 50 ug via INTRAVENOUS

## 2021-06-05 MED ORDER — ONDANSETRON HCL 4 MG PO TABS: 4.0000 mg | ORAL_TABLET | Freq: Four times a day (QID) | ORAL | Status: DC | PRN

## 2021-06-05 MED ORDER — SODIUM CHLORIDE 0.9% FLUSH
3.0000 mL | Freq: Two times a day (BID) | INTRAVENOUS | Status: DC
  Administered 2021-06-05 – 2021-06-11 (×10): 3 mL via INTRAVENOUS

## 2021-06-05 MED ORDER — ROCURONIUM BROMIDE 10 MG/ML (PF) SYRINGE
PREFILLED_SYRINGE | INTRAVENOUS | Status: DC | PRN
Start: 2021-06-05 — End: 2021-06-05
  Administered 2021-06-05 (×2): 20 mg via INTRAVENOUS
  Administered 2021-06-05: 60 mg via INTRAVENOUS

## 2021-06-05 MED ORDER — 0.9 % SODIUM CHLORIDE (POUR BTL) OPTIME
TOPICAL | Status: DC | PRN
  Administered 2021-06-05: 1000 mL

## 2021-06-05 MED ORDER — BUPIVACAINE HCL (PF) 0.5 % IJ SOLN
INTRAMUSCULAR | Status: AC
  Filled 2021-06-05: qty 30

## 2021-06-05 MED ORDER — SENNA 8.6 MG PO TABS: 1.0000 | ORAL_TABLET | Freq: Two times a day (BID) | ORAL | Status: DC

## 2021-06-05 MED ORDER — HEMOSTATIC AGENTS (NO CHARGE) OPTIME
TOPICAL | Status: DC | PRN
  Administered 2021-06-05: 1 via TOPICAL

## 2021-06-05 MED ORDER — MIDAZOLAM HCL 2 MG/2ML IJ SOLN
INTRAMUSCULAR | Status: DC | PRN
  Administered 2021-06-05: 2 mg via INTRAVENOUS

## 2021-06-05 MED ORDER — CHLORHEXIDINE GLUCONATE 0.12 % MT SOLN: 15.0000 mL | Freq: Once | OROMUCOSAL | Status: AC

## 2021-06-05 MED ORDER — MIDAZOLAM HCL 2 MG/2ML IJ SOLN
INTRAMUSCULAR | Status: AC
  Filled 2021-06-05: qty 2

## 2021-06-05 MED ORDER — THROMBIN 5000 UNITS EX SOLR
CUTANEOUS | Status: AC
  Filled 2021-06-05: qty 10000

## 2021-06-05 MED ORDER — LACTATED RINGERS IV SOLN: INTRAVENOUS | Status: DC

## 2021-06-05 MED ORDER — CHLORHEXIDINE GLUCONATE 0.12 % MT SOLN
OROMUCOSAL | Status: AC
  Administered 2021-06-05: 15 mL
  Filled 2021-06-05: qty 15

## 2021-06-05 MED ORDER — PHENOL 1.4 % MT LIQD: 1.0000 | OROMUCOSAL | Status: DC | PRN

## 2021-06-05 MED ORDER — CHLORHEXIDINE GLUCONATE CLOTH 2 % EX PADS
6.0000 | MEDICATED_PAD | Freq: Every day | CUTANEOUS | Status: AC
Start: 2021-06-06 — End: 2021-06-11
  Administered 2021-06-06 – 2021-06-09 (×4): 6 via TOPICAL

## 2021-06-05 MED ORDER — PROPOFOL 10 MG/ML IV BOLUS
INTRAVENOUS | Status: DC | PRN
  Administered 2021-06-05: 120 mg via INTRAVENOUS

## 2021-06-05 MED ORDER — MENTHOL 3 MG MT LOZG: 1.0000 | LOZENGE | OROMUCOSAL | Status: DC | PRN

## 2021-06-05 MED ORDER — LIDOCAINE 2% (20 MG/ML) 5 ML SYRINGE
INTRAMUSCULAR | Status: DC | PRN
  Administered 2021-06-05: 100 mg via INTRAVENOUS

## 2021-06-05 MED ORDER — FLEET ENEMA 7-19 GM/118ML RE ENEM: 1.0000 | ENEMA | Freq: Once | RECTAL | Status: DC | PRN

## 2021-06-05 MED ORDER — FENTANYL CITRATE (PF) 100 MCG/2ML IJ SOLN
INTRAMUSCULAR | Status: AC
  Filled 2021-06-05: qty 2

## 2021-06-05 MED ORDER — DEXAMETHASONE SODIUM PHOSPHATE 10 MG/ML IJ SOLN
INTRAMUSCULAR | Status: DC | PRN
  Administered 2021-06-05: 10 mg via INTRAVENOUS

## 2021-06-05 MED ORDER — FENTANYL CITRATE (PF) 250 MCG/5ML IJ SOLN
INTRAMUSCULAR | Status: AC
  Filled 2021-06-05: qty 5

## 2021-06-05 MED ORDER — BISACODYL 10 MG RE SUPP: 10.0000 mg | Freq: Every day | RECTAL | Status: DC | PRN

## 2021-06-05 MED ORDER — ACETAMINOPHEN 10 MG/ML IV SOLN
INTRAVENOUS | Status: DC | PRN
  Administered 2021-06-05: 1000 mg via INTRAVENOUS

## 2021-06-05 MED ORDER — SODIUM CHLORIDE 0.9% FLUSH: 3.0000 mL | INTRAVENOUS | Status: DC | PRN

## 2021-06-05 MED ORDER — THROMBIN 5000 UNITS EX SOLR
CUTANEOUS | Status: DC | PRN
  Administered 2021-06-05 (×2): 5000 [IU] via TOPICAL

## 2021-06-05 MED ORDER — LABETALOL HCL 5 MG/ML IV SOLN
INTRAVENOUS | Status: DC | PRN
  Administered 2021-06-05: 5 mg via INTRAVENOUS

## 2021-06-05 MED ORDER — ONDANSETRON HCL 4 MG/2ML IJ SOLN: 4.0000 mg | Freq: Four times a day (QID) | INTRAMUSCULAR | Status: DC | PRN

## 2021-06-05 MED ORDER — THROMBIN 5000 UNITS EX SOLR
CUTANEOUS | Status: AC
  Filled 2021-06-05: qty 5000

## 2021-06-05 MED ORDER — ONDANSETRON HCL 4 MG/2ML IJ SOLN
INTRAMUSCULAR | Status: DC | PRN
  Administered 2021-06-05: 4 mg via INTRAVENOUS

## 2021-06-05 MED ORDER — ACETAMINOPHEN 10 MG/ML IV SOLN
INTRAVENOUS | Status: AC
  Filled 2021-06-05: qty 100

## 2021-06-05 MED ORDER — LIDOCAINE-EPINEPHRINE 1 %-1:100000 IJ SOLN
INTRAMUSCULAR | Status: AC
  Filled 2021-06-05: qty 1

## 2021-06-05 MED ORDER — PROMETHAZINE HCL 25 MG/ML IJ SOLN: 6.2500 mg | INTRAMUSCULAR | Status: DC | PRN

## 2021-06-05 MED ORDER — BUPIVACAINE HCL (PF) 0.5 % IJ SOLN
INTRAMUSCULAR | Status: DC | PRN
  Administered 2021-06-05: 25 mL
  Administered 2021-06-05: 5 mL

## 2021-06-05 MED ORDER — SUGAMMADEX SODIUM 200 MG/2ML IV SOLN
INTRAVENOUS | Status: DC | PRN
  Administered 2021-06-05: 200 mg via INTRAVENOUS

## 2021-06-05 MED ORDER — PHENYLEPHRINE 40 MCG/ML (10ML) SYRINGE FOR IV PUSH (FOR BLOOD PRESSURE SUPPORT)
PREFILLED_SYRINGE | INTRAVENOUS | Status: DC | PRN
  Administered 2021-06-05 (×3): 80 ug via INTRAVENOUS

## 2021-06-05 MED ORDER — PHENYLEPHRINE HCL-NACL 20-0.9 MG/250ML-% IV SOLN
INTRAVENOUS | Status: DC | PRN
  Administered 2021-06-05: 20 ug/min via INTRAVENOUS

## 2021-06-05 MED ORDER — LACTATED RINGERS IV SOLN: INTRAVENOUS | Status: DC | PRN

## 2021-06-05 MED ORDER — FENTANYL CITRATE (PF) 250 MCG/5ML IJ SOLN
INTRAMUSCULAR | Status: DC | PRN
  Administered 2021-06-05 (×2): 50 ug via INTRAVENOUS
  Administered 2021-06-05: 100 ug via INTRAVENOUS
  Administered 2021-06-05 (×2): 25 ug via INTRAVENOUS

## 2021-06-05 MED ORDER — MUPIROCIN 2 % EX OINT
1.0000 "application " | TOPICAL_OINTMENT | Freq: Two times a day (BID) | CUTANEOUS | Status: AC
  Administered 2021-06-05 – 2021-06-10 (×10): 1 via NASAL
  Filled 2021-06-05 (×4): qty 22

## 2021-06-05 MED ORDER — CEFAZOLIN SODIUM-DEXTROSE 2-4 GM/100ML-% IV SOLN
2.0000 g | Freq: Three times a day (TID) | INTRAVENOUS | Status: DC
Start: 2021-06-05 — End: 2021-06-05

## 2021-06-05 MED ORDER — SODIUM CHLORIDE 0.9 % IV SOLN
250.0000 mL | INTRAVENOUS | Status: DC
  Administered 2021-06-05: 250 mL via INTRAVENOUS

## 2021-06-05 SURGICAL SUPPLY — 49 items
ADH SKN CLS APL DERMABOND .7 (GAUZE/BANDAGES/DRESSINGS) ×1
BAG COUNTER SPONGE SURGICOUNT (BAG) ×2 IMPLANT
BAG SPNG CNTER NS LX DISP (BAG) ×1
BAND INSRT 18 STRL LF DISP RB (MISCELLANEOUS)
BAND RUBBER #18 3X1/16 STRL (MISCELLANEOUS) IMPLANT
BLADE CLIPPER SURG (BLADE) IMPLANT
BUR ACORN 6.0 (BURR) IMPLANT
BUR MATCHSTICK NEURO 3.0 LAGG (BURR) ×2 IMPLANT
CANISTER SUCT 3000ML PPV (MISCELLANEOUS) ×2 IMPLANT
DECANTER SPIKE VIAL GLASS SM (MISCELLANEOUS) ×2 IMPLANT
DERMABOND ADVANCED (GAUZE/BANDAGES/DRESSINGS) ×1
DERMABOND ADVANCED .7 DNX12 (GAUZE/BANDAGES/DRESSINGS) ×1 IMPLANT
DEVICE DISSECT PLASMABLAD 3.0S (MISCELLANEOUS) IMPLANT
DRAPE HALF SHEET 40X57 (DRAPES) IMPLANT
DRAPE LAPAROTOMY 100X72X124 (DRAPES) ×2 IMPLANT
DRAPE MICROSCOPE LEICA (MISCELLANEOUS) IMPLANT
DRSG OPSITE POSTOP 4X6 (GAUZE/BANDAGES/DRESSINGS) ×1 IMPLANT
DURAPREP 26ML APPLICATOR (WOUND CARE) ×2 IMPLANT
ELECT REM PT RETURN 9FT ADLT (ELECTROSURGICAL) ×2
ELECTRODE REM PT RTRN 9FT ADLT (ELECTROSURGICAL) ×1 IMPLANT
GAUZE 4X4 16PLY ~~LOC~~+RFID DBL (SPONGE) IMPLANT
GAUZE SPONGE 4X4 12PLY STRL (GAUZE/BANDAGES/DRESSINGS) ×2 IMPLANT
GLOVE SURG LTX SZ8.5 (GLOVE) ×2 IMPLANT
GLOVE SURG UNDER POLY LF SZ8.5 (GLOVE) ×2 IMPLANT
GOWN STRL REUS W/ TWL LRG LVL3 (GOWN DISPOSABLE) IMPLANT
GOWN STRL REUS W/ TWL XL LVL3 (GOWN DISPOSABLE) IMPLANT
GOWN STRL REUS W/TWL 2XL LVL3 (GOWN DISPOSABLE) ×2 IMPLANT
GOWN STRL REUS W/TWL LRG LVL3 (GOWN DISPOSABLE)
GOWN STRL REUS W/TWL XL LVL3 (GOWN DISPOSABLE)
HEMOSTAT POWDER KIT SURGIFOAM (HEMOSTASIS) ×1 IMPLANT
KIT BASIN OR (CUSTOM PROCEDURE TRAY) ×2 IMPLANT
KIT TURNOVER KIT B (KITS) ×2 IMPLANT
NDL SPNL 20GX3.5 QUINCKE YW (NEEDLE) IMPLANT
NEEDLE HYPO 22GX1.5 SAFETY (NEEDLE) ×2 IMPLANT
NEEDLE SPNL 20GX3.5 QUINCKE YW (NEEDLE) IMPLANT
NS IRRIG 1000ML POUR BTL (IV SOLUTION) ×2 IMPLANT
PACK LAMINECTOMY NEURO (CUSTOM PROCEDURE TRAY) ×2 IMPLANT
PAD ARMBOARD 7.5X6 YLW CONV (MISCELLANEOUS) ×6 IMPLANT
PATTIES SURGICAL .5 X1 (DISPOSABLE) ×2 IMPLANT
PLASMABLADE 3.0S (MISCELLANEOUS) ×2
SPONGE SURGIFOAM ABS GEL SZ50 (HEMOSTASIS) ×2 IMPLANT
SUT VIC AB 1 CT1 18XBRD ANBCTR (SUTURE) ×1 IMPLANT
SUT VIC AB 1 CT1 8-18 (SUTURE) ×2
SUT VIC AB 2-0 CP2 18 (SUTURE) ×2 IMPLANT
SUT VIC AB 3-0 SH 8-18 (SUTURE) ×2 IMPLANT
SUT VIC AB 4-0 RB1 18 (SUTURE) ×2 IMPLANT
TOWEL GREEN STERILE (TOWEL DISPOSABLE) ×2 IMPLANT
TOWEL GREEN STERILE FF (TOWEL DISPOSABLE) ×2 IMPLANT
WATER STERILE IRR 1000ML POUR (IV SOLUTION) ×2 IMPLANT

## 2021-06-05 NOTE — Progress Notes (Signed)
Inpatient Rehab Admissions Coordinator:   Met with patient and his family at bedside to discuss CIR.  I reviewed 3 hrs/day of therapy and average length of stay to be about 2 weeks.  Note pt plans for surgery later today so will f/u post op once therapy has a chance to see him again.   Shann Medal, PT, DPT Admissions Coordinator [phone] 06/05/21  12:49 PM

## 2021-06-05 NOTE — Progress Notes (Signed)
Nutrition Follow-up  DOCUMENTATION CODES:   Not applicable  INTERVENTION:   -D/c Prosource Plus -Continue Magic cup TID with meals, each supplement provides 290 kcal and 9 grams of protein  -D/c Glucerna Shake po BID, each supplement provides 220 kcal and 10 grams of protein  -Continue Ensure Max po daily, each supplement provides 150 kcal and 30 grams of protein.   -Continue MVI with minerals daily  NUTRITION DIAGNOSIS:   Increased nutrient needs related to acute illness (osteomyelitis) as evidenced by estimated needs.  Ongoing  GOAL:   Patient will meet greater than or equal to 90% of their needs  Progressing   MONITOR:   PO intake, Supplement acceptance, Labs, Weight trends, Skin, I & O's  REASON FOR ASSESSMENT:   Malnutrition Screening Tool    ASSESSMENT:   John Ware is a 55 y.o. 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  He had bilateral shoulder debridement, and drain placement.  He was discharged with IV antibiotic therapy.  PICC line cefepime and then transition to oral cephalexin.  Reviewed I/O's: +800 ml x 24 hours and -671 ml since admission  UOP: 800 ml x 24 hours  Per neurosurgery notes, plan for decompression of L1-2 and L2-3 today. Pt currently NPO for procedure.   Pt with improved oral intake. Noted meal completion 80-100%. Pt has been consuming Glucerna and Ensure Max supplements.    Medications reviewed and include morphine and senokot.   Labs reviewed: Na: 132, CBGS: 136-195 (inpatient orders for glycemic control are 0-9 units inuslin aspart TID with meals and 8 units insulin glargine-yfgn daily).    Diet Order:   Diet Order             Diet NPO time specified  Diet effective midnight                   EDUCATION NEEDS:   No  education needs have been identified at this time  Skin:  Skin Assessment: Reviewed RN Assessment  Last BM:  05/28/21  Height:   Ht Readings from Last 1 Encounters:  05/29/21 5\' 10"  (1.778 m)    Weight:   Wt Readings from Last 1 Encounters:  05/29/21 77.1 kg    Ideal Body Weight:  75.5 kg  BMI:  Body mass index is 24.39 kg/m.  Estimated Nutritional Needs:   Kcal:  1900-2100  Protein:  115-130 grams  Fluid:  > 1.9 L    07/29/21, RD, LDN, CDCES Registered Dietitian II Certified Diabetes Care and Education Specialist Please refer to [facility] for RD and/or RD on-call/weekend/after hours pager

## 2021-06-05 NOTE — Progress Notes (Signed)
Patient ID: John Ware, male   DOB: [birth year], 55 y.o.   MRN: [id number] Patient is ready for surgery today. Questions answered.

## 2021-06-05 NOTE — Anesthesia Procedure Notes (Signed)
Procedure Name: Intubation Date/Time: 06/05/2021 5:11 PM Performed by: Thelma Comp, CRNA Pre-anesthesia Checklist: Patient identified, Emergency Drugs available, Suction available and Patient being monitored Patient Re-evaluated:Patient Re-evaluated prior to induction Oxygen Delivery Method: Circle System Utilized Preoxygenation: Pre-oxygenation with 100% oxygen Induction Type: IV induction Ventilation: Mask ventilation without difficulty Laryngoscope Size: Mac and 4 Grade View: Grade I Tube type: Oral Tube size: 7.5 mm Number of attempts: 1 Airway Equipment and Method: Stylet Placement Confirmation: ETT inserted through vocal cords under direct vision, positive ETCO2 and breath sounds checked- equal and bilateral Secured at: 22 cm Tube secured with: Tape Dental Injury: Teeth and Oropharynx as per pre-operative assessment

## 2021-06-05 NOTE — Transfer of Care (Signed)
Immediate Anesthesia Transfer of Care Note  Patient: John Ware  Procedure(s) Performed: Left lumbar one-two, Lumbar two-three Laminectomy (Left: Back)  Patient Location: PACU  Anesthesia Type:General  Level of Consciousness: awake, alert  and patient cooperative  Airway & Oxygen Therapy: Patient Spontanous Breathing  Post-op Assessment: Report given to RN, Post -op Vital signs reviewed and stable and Patient moving all extremities  Post vital signs: Reviewed and stable  Last Vitals:  Vitals Value Taken Time  BP 116/78   Temp    Pulse 76 06/05/21 1900  Resp 11 06/05/21 1900  SpO2 96 % 06/05/21 1900  Vitals shown include unvalidated device data.  Last Pain:  Vitals:   06/05/21 1451  TempSrc:   PainSc: 5       Patients Stated Pain Goal: 0 (06/03/21 2207)  Complications: No notable events documented.

## 2021-06-05 NOTE — Anesthesia Preprocedure Evaluation (Signed)
Anesthesia Evaluation  Patient identified by MRN, date of birth, ID band Patient awake    Airway Mallampati: II  TM Distance: >3 FB     Dental   Pulmonary former smoker,    breath sounds clear to auscultation       Cardiovascular hypertension, + DOE   Rhythm:Regular Rate:Normal     Neuro/Psych  Neuromuscular disease    GI/Hepatic negative GI ROS, Neg liver ROS,   Endo/Other  diabetes  Renal/GU negative Renal ROS     Musculoskeletal  (+) Arthritis ,   Abdominal   Peds  Hematology   Anesthesia Other Findings   Reproductive/Obstetrics                             Anesthesia Physical Anesthesia Plan  ASA: 3  Anesthesia Plan: General   Post-op Pain Management:    Induction:   PONV Risk Score and Plan: 3 and Ondansetron, Dexamethasone and Midazolam  Airway Management Planned: Oral ETT  Additional Equipment:   Intra-op Plan:   Post-operative Plan: Extubation in OR  Informed Consent: I have reviewed the patients History and Physical, chart, labs and discussed the procedure including the risks, benefits and alternatives for the proposed anesthesia with the patient or authorized representative who has indicated his/her understanding and acceptance.     Dental advisory given  Plan Discussed with: CRNA and Anesthesiologist  Anesthesia Plan Comments:         Anesthesia Quick Evaluation

## 2021-06-05 NOTE — Op Note (Signed)
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 Postoperative diagnosis: Same Procedure: Lumbar laminotomies and decompression L1-2 and L2-3 on the left decompression of the common dural tube and nerve roots of L1 and L2 and L3. Surgeon: John Ware Anesthesia: General endotracheal Indications: John Ware is a 55 year old individuals had significant back and left lower extremity pain and weakness.  An MRI had demonstrated presence of marked discitis and osteomyelitis at L1-2 and L2-3 with a significant formation of phlegmon.  There is a significant stenosis at these areas and despite over a weeks of antibiotic treatment with IVs he has had some progressive weakness of left-side.  Is discussed and advised that would take him to the operating room to undergo surgical decompression on that left side.  Procedure: Patient was brought to the operating room supine on the stretcher.  After the smooth induction of general endotracheal anesthesia, he was carefully turned prone.  Back was prepped with alcohol DuraPrep and draped in a sterile fashion.  Midline incision was created and carried down to the lumbodorsal fascia at approximately the level of L2-3 and a localizing radiograph identified the L2 spinous process.  I then dissected in the interlaminar space and a second localizing x-ray identified the L1-2 space.  Laminotomy was then created using a high-speed drill and 2 and 3 mm Kerrison punch to remove significant redundant yellow ligament in this region.  Tissues were noted to be somewhat edematous.  There are also hyperemic.  Hemostasis was carefully obtained along the way and then the epidural space was entered and decompressed of a substantial amount of edematous yellow ligamentous material.  Dissection was carried out laterally and the lateral aspect of the dura including the L1 nerve root superiorly and the L2  nerve root inferiorly were decompressed.  And then once this area was decompressed laminotomy was created at L2-3 do the same thing.  The L2 and L3 nerve roots were decompressed in this region.  Care was taken to identify the area of the disc space at L1-2 and here there is more edematous material but no pus the posterior longitudinal ligament was opened and the space was entered and some markedly degraded disc material was removed from within the disc space this was sent for culture and sensitivity.  Decompression of the space was undertaken but only some remnants of disc material were encountered.  No pus was encountered.  Once this was completed the area was inspected carefully hemostasis was achieved and then the retractors were removed 25 cc of half percent Marcaine was injected into the paraspinous fascia and the lumbodorsal fascia was closed with #1 Vicryl interrupted fashion 2-0 Vicryl was used in the subcutaneous tissues 3-0 Vicryl subcuticularly and Dermabond was placed on the skin blood loss for the procedure was estimated at 150 cc.  Patient tolerated surgery well was returned to recovery room stable condition.

## 2021-06-05 NOTE — Progress Notes (Signed)
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  He completed course of IV antibiotics and was transitioned to oral antibiotic.  He presented on 05/28/21 with worsening back pain and focal neurologic deficit, left lower extremity weakness.  Work-up revealed lumbar spine MRI with ongoing discitis/osteomyelitis at L1-L2 and L2-L3.  Epidural phlegmon along the posterior endplate centered at L1 with severe spinal canal stenosis with compression of cauda equina nerve roots.  Decreased size and number of bilateral psoas abscesses with small residual abscess bilaterally.  Diffuse signal abnormality throughout the psoas and paraspinal musculature consistent with inflammatory myositis.   Patient was transferred to [facility] for further neurosurgery evaluation.  Seen by infectious disease.  Repeated blood culture (05/28/21) positive for MSSA 1 out of 2 bottles.  Repeated blood cultures on 06/01/2021, no growth to date.  Reports to neurosurgery on 06/04/21 increasing weakness in the left leg and now affecting the right side.  Plan for surgical intervention to decompress the left-sided L1-2 and L2-3 on 06/05/21.  06/05/2021: Seen at bedside.  Has no new complaints.  Neurosurgical procedure planned today.  Assessment/Plan: Principal Problem:   Infection of spine (HCC) Active Problems:   Depression, recurrent (HCC)   Diabetes mellitus (HCC)   Diabetic polyneuropathy associated with type 2 diabetes mellitus (HCC)   HTN (hypertension)   Endocarditis   Psoas abscess (HCC)   L1-L2 and L2-L3 osteomyelitis, POA, complicated with compression of cauda equina nerve roots. Left hip pain. Left lower extremity weakness. Patient with persistent back pain and left lower extremity proximal weakness. Continue cefazolin as recommended by  infectious disease Continue pain management in place with IV Dilaudid for severe pain, p.o. morphine oxycodone for moderate pain Continue to wean off IV Dilaudid as tolerated. He is also on as needed gabapentin and as needed Robaxin CRP 5.1, on 05/29/2021 Sed rate 77 on 04/09/2021, sed rate 44 on 05/31/2021. Appreciate neurosurgery and infectious disease's assistance.  Patient reports to neurosurgery increasing weakness in the left leg and now affecting the right side.  Plan for surgical intervention to decompress the left-sided L1-2 and L2-3 on 06/05/21.  Gentle iv fluid lr 30cc /hr x 2 days  MSSA bacteremia 1 out of 2 bottles positive for MSSA, collected on 05/28/2021. Blood cultures repeated on 06/01/2021 repeated blood cultures negative to date.   Continue cefazolin per infectious disease recommendations. Afebrile with no leukocytosis. Will need negative blood cultures > 48 hours prior to PICC line insertion. Seen by infectious disease, okay to place PICC line and continue cefazolin.  Left lower extremity weakness in the setting of osteomyelitis discitis at L1-2 and L2-3 with epidural phlegmon PT OT assessment, recommended no OT or PT follow-up. Continue fall precautions Rest of management per neurosurgery Surgical intervention planned tomorrow   Type 2 diabetes mellitus with hyperglycemia.   Hemoglobin A1c 8.4 on 05/28/2021.   Hold off home oral hypoglycemics, metformin and empagliflozin.  Continue with insulin sliding scale for glucose coverage and monitoring. Semglee 8 units daily started on 06/04/2021. Diabetes coordinator assisting with management, appreciate assistance.   Diabetic neuropathy.   Continue gabapentin and methocarbamol as needed.   Iron deficiency anemia, stable. Hgb is 12.0 and hct is 37.5  Iron studies done on 02/21/2021, showing iron deficiency. Hgb has been stable, patient not taking iron supplementation   HTN.  BP stable.   Continue to hold on  antihypertensive medications to avoid hypotension.   Generalized weakness PT OT assessment Fall precautions     Patient continue to be at high risk for worsening neurologic deficit.    Status is: Inpatient   Remains inpatient appropriate because:Inpatient level of care appropriate due to severity of illness   Dispo: The patient is from: Home              Anticipated d/c is to: Home              Patient currently is not medically stable to d/c.              Difficult to place patient No   DVT prophylaxis:      Scds   Code Status:               full  Family Communication:        No family at the bedside         Consultants:  ID Neurosurgery        Antimicrobials:  Cefazolin      Code Status: Full code  Family Communication: None at bedside  Disposition Plan: Possibly CIR or home with home health services once neurosurgery signs off.   Consultants: Neurosurgery Infectious disease  Procedures: None  Antimicrobials: Cefazolin  DVT prophylaxis: SCDs  Status is: Inpatient    Dispo: The patient is from: Home.              Anticipated d/c is to: Home after infectious disease and neurosurgery sign off.              Patient currently medically not stable for discharge.   Difficult to place patient not applicable.        Objective: Vitals:   06/04/21 0742 06/04/21 1522 06/04/21 2126 06/05/21 0759  BP: 98/71 121/78 128/88 102/82  Pulse: 69 81 80 69  Resp: 16 16 18 15   Temp: 99 F (37.2 C) 98.5 F (36.9 C) 98.6 F (37 C) 98.6 F (37 C)  TempSrc: Oral Oral Oral Oral  SpO2: 95% 98% 99% 96%  Weight:      Height:        Intake/Output Summary (Last 24 hours) at 06/05/2021 1405 Last data filed at 06/05/2021 0800 Gross per 24 hour  Intake 240 ml  Output 300 ml  Net -60 ml   Filed Weights   05/29/21 1756  Weight: 77.1 kg    Exam:  no significant changes from prior exam  General: 55 y.o. year-old male frail-appearing no acute distress.  He  is alert and oriented x3. Cardiovascular: Regular rate and rhythm no rubs or gallops.  Respiratory: Clear to auscultation with no wheezes or rales. Abdomen: Soft Nontender Normal Bowel Sounds Present. Musculoskeletal: No lower extremity edema bilaterally. Skin: No ulcerative lesions noted.   Psychiatry: Mood is appropriate for condition and setting.   Data Reviewed: CBC: Recent Labs  Lab 06/01/21 0600 06/04/21 0519 06/05/21 0259  WBC 8.6 9.2 10.9*  HGB 12.8* 12.3* 11.8*  HCT 39.3 37.2* 36.6*  MCV 81.2 80.3 81.0  PLT 389 445* 472*   Basic Metabolic Panel: Recent Labs  Lab 06/01/21 0600 06/04/21 0519 06/05/21 0259  NA 132* 130* 130*  K 4.5 4.5 4.4  CL 95* 93* 94*  CO2 26 27 29   GLUCOSE 179* 209* 159*  BUN 12 11 15   CREATININE 0.52* 0.57* 0.51*  CALCIUM 9.4 9.3 9.2  MG 1.7  1.8 1.7  PHOS 3.9  --  4.4   GFR: Estimated Creatinine Clearance: 110.3 mL/min (A) (by C-G formula based on SCr of 0.51 mg/dL (L)). Liver Function Tests: Recent Labs  Lab 06/01/21 0600  AST 18  ALT 11  ALKPHOS 106  BILITOT 0.6  PROT 7.3  ALBUMIN 2.9*   No results for input(s): LIPASE, AMYLASE in the last 168 hours. No results for input(s): AMMONIA in the last 168 hours. Coagulation Profile: No results for input(s): INR, PROTIME in the last 168 hours. Cardiac Enzymes: No results for input(s): CKTOTAL, CKMB, CKMBINDEX, TROPONINI in the last 168 hours. BNP (last 3 results) No results for input(s): PROBNP in the last 8760 hours. HbA1C: No results for input(s): HGBA1C in the last 72 hours.  CBG: Recent Labs  Lab 06/04/21 1219 06/04/21 1641 06/04/21 2025 06/05/21 0628 06/05/21 1128  GLUCAP 181* 183* 192* 136* 162*   Lipid Profile: No results for input(s): CHOL, HDL, LDLCALC, TRIG, CHOLHDL, LDLDIRECT in the last 72 hours. Thyroid Function Tests: No results for input(s): TSH, T4TOTAL, FREET4, T3FREE, THYROIDAB in the last 72 hours. Anemia Panel: No results for input(s): VITAMINB12,  FOLATE, FERRITIN, TIBC, IRON, RETICCTPCT in the last 72 hours. Urine analysis:    Component Value Date/Time   COLORURINE COLORLESS (A) 05/28/2021 1353   APPEARANCEUR CLEAR 05/28/2021 1353   LABSPEC 1.002 (L) 05/28/2021 1353   PHURINE 8.0 05/28/2021 1353   GLUCOSEU NEGATIVE 05/28/2021 1353   HGBUR MODERATE (A) 05/28/2021 1353   BILIRUBINUR NEGATIVE 05/28/2021 1353   KETONESUR NEGATIVE 05/28/2021 1353   PROTEINUR NEGATIVE 05/28/2021 1353   NITRITE NEGATIVE 05/28/2021 1353   LEUKOCYTESUR NEGATIVE 05/28/2021 1353   Sepsis Labs: (procalcitonin:4,lacticidven:4)  ) Recent Results (from the past 240 hour(s))  Culture, blood (Routine X 2) w Reflex to ID Panel     Status: None   Collection Time: 05/28/21  5:20 PM   Specimen: BLOOD  Result Value Ref Range Status   Specimen Description   Final    BLOOD LEFT ANTECUBITAL Performed at [facility], [address], [place], [place] [address]    Special Requests   Final    BOTTLES DRAWN AEROBIC AND ANAEROBIC Blood Culture adequate volume Performed at [facility], [address], [place], [place] [address]    Culture   Final    NO GROWTH 5 DAYS Performed at [facility], [address] [place], [place] [address]    Report Status 06/02/2021 FINAL  Final  Culture, blood (Routine X 2) w Reflex to ID Panel     Status: Abnormal (Preliminary result)   Collection Time: 05/28/21  5:22 PM   Specimen: BLOOD  Result Value Ref Range Status   Specimen Description   Final    BLOOD RIGHT ANTECUBITAL Performed at [facility], [address] [place], [place] [address]    Special Requests   Final    BOTTLES DRAWN AEROBIC AND ANAEROBIC Blood Culture results may not be optimal due to an excessive volume of blood received in culture bottles Performed at [facility], [address], [place], [place] [address]    Culture  Setup Time   Final    GRAM POSITIVE  COCCI ANAEROBIC BOTTLE ONLY CRITICAL RESULT CALLED TO, READ BACK BY AND VERIFIED WITH: PHARMD GREG ABBOTT 05/31/2021@2 :56 BY TW    Culture (A)  Final    STAPHYLOCOCCUS AUREUS Sent to [facility] for further susceptibility testing. Performed at [facility],  [address], [place], [place] [address]    Report Status PENDING  Incomplete  Blood Culture ID Panel (Reflexed)     Status: Abnormal   Collection Time: 05/28/21  5:22 PM  Result Value Ref Range Status   Enterococcus faecalis NOT DETECTED NOT DETECTED Final   Enterococcus Faecium NOT DETECTED NOT DETECTED Final   Listeria monocytogenes NOT DETECTED NOT DETECTED Final   Staphylococcus species DETECTED (A) NOT DETECTED Final    Comment: CRITICAL RESULT CALLED TO, READ BACK BY AND VERIFIED WITH: PHARMD GREG ABBOTT 05/31/2021@2 :56 BY TW    Staphylococcus aureus (BCID) DETECTED (A) NOT DETECTED Final    Comment: CRITICAL RESULT CALLED TO, READ BACK BY AND VERIFIED WITH: PHARMD GREG ABBOTT 05/31/2021@2 :56 BY TW    Staphylococcus epidermidis NOT DETECTED NOT DETECTED Final   Staphylococcus lugdunensis NOT DETECTED NOT DETECTED Final   Streptococcus species NOT DETECTED NOT DETECTED Final   Streptococcus agalactiae NOT DETECTED NOT DETECTED Final   Streptococcus pneumoniae NOT DETECTED NOT DETECTED Final   Streptococcus pyogenes NOT DETECTED NOT DETECTED Final   A.calcoaceticus-baumannii NOT DETECTED NOT DETECTED Final   Bacteroides fragilis NOT DETECTED NOT DETECTED Final   Enterobacterales NOT DETECTED NOT DETECTED Final   Enterobacter cloacae complex NOT DETECTED NOT DETECTED Final   Escherichia coli NOT DETECTED NOT DETECTED Final   Klebsiella aerogenes NOT DETECTED NOT DETECTED Final   Klebsiella oxytoca NOT DETECTED NOT DETECTED Final   Klebsiella pneumoniae NOT DETECTED NOT DETECTED Final   Proteus species NOT DETECTED NOT DETECTED Final   Salmonella species NOT DETECTED NOT DETECTED Final   Serratia marcescens NOT  DETECTED NOT DETECTED Final   Haemophilus influenzae NOT DETECTED NOT DETECTED Final   Neisseria meningitidis NOT DETECTED NOT DETECTED Final   Pseudomonas aeruginosa NOT DETECTED NOT DETECTED Final   Stenotrophomonas maltophilia NOT DETECTED NOT DETECTED Final   Candida albicans NOT DETECTED NOT DETECTED Final   Candida auris NOT DETECTED NOT DETECTED Final   Candida glabrata NOT DETECTED NOT DETECTED Final   Candida krusei NOT DETECTED NOT DETECTED Final   Candida parapsilosis NOT DETECTED NOT DETECTED Final   Candida tropicalis NOT DETECTED NOT DETECTED Final   Cryptococcus neoformans/gattii NOT DETECTED NOT DETECTED Final   Meth resistant mecA/C and MREJ NOT DETECTED NOT DETECTED Final    Comment: Performed at [facility], [address] [place], [place] [address]  Resp Panel by RT-PCR (Flu A&B, Covid) Nasopharyngeal Swab     Status: None   Collection Time: 05/28/21  5:30 PM   Specimen: Nasopharyngeal Swab; Nasopharyngeal(NP) swabs in vial transport medium  Result Value Ref Range Status   SARS Coronavirus 2 by RT PCR NEGATIVE NEGATIVE Final    Comment: (NOTE) SARS-CoV-2 target nucleic acids are NOT DETECTED.  The SARS-CoV-2 RNA is generally detectable in upper respiratory specimens during the acute phase of infection. The lowest concentration of SARS-CoV-2 viral copies this assay can detect is 138 copies/mL. A negative result does not preclude SARS-Cov-2 infection and should not be used as the sole basis for treatment or other patient management decisions. A negative result may occur with  improper specimen collection/handling, submission of specimen other than nasopharyngeal swab, presence of viral mutation(s) within the areas targeted by this assay, and inadequate number of viral copies(<138 copies/mL). A negative result must be combined with clinical observations, patient history, and epidemiological information. The expected result is Negative.  Fact Sheet for  Patients:  BloggerCourse.com  Fact Sheet for Healthcare Providers:  SeriousBroker.it  This test is no t yet approved or cleared by the [place] and  has been authorized for detection and/or diagnosis of SARS-CoV-2 by FDA under an Emergency Use Authorization (EUA). This EUA will remain  in effect (meaning this test can be used) for the duration of the COVID-19 declaration under Section 564(b)(1) of the Act, 21 U.S.C.section 360bbb-3(b)(1), unless the authorization is terminated  or revoked sooner.       Influenza A by PCR NEGATIVE NEGATIVE Final   Influenza B by PCR NEGATIVE NEGATIVE Final    Comment: (NOTE) The Xpert Xpress SARS-CoV-2/FLU/RSV plus assay is intended as an aid in the diagnosis of influenza from Nasopharyngeal swab specimens and should not be used as a sole basis for treatment. Nasal washings and aspirates are unacceptable for Xpert Xpress SARS-CoV-2/FLU/RSV testing.  Fact Sheet for Patients: BloggerCourse.com  Fact Sheet for Healthcare Providers: SeriousBroker.it  This test is not yet approved or cleared by the [place] FDA and has been authorized for detection and/or diagnosis of SARS-CoV-2 by FDA under an Emergency Use Authorization (EUA). This EUA will remain in effect (meaning this test can be used) for the duration of the COVID-19 declaration under Section 564(b)(1) of the Act, 21 U.S.C. section 360bbb-3(b)(1), unless the authorization is terminated or revoked.  Performed at [facility], [address], [place], [place] [address]   Culture, blood (routine x 2)     Status: None (Preliminary result)   Collection Time: 06/01/21  6:02 AM   Specimen: BLOOD RIGHT HAND  Result Value Ref Range Status   Specimen Description BLOOD RIGHT HAND  Final   Special Requests   Final    AEROBIC BOTTLE ONLY Blood Culture results may not be  optimal due to an inadequate volume of blood received in culture bottles   Culture   Final    NO GROWTH 4 DAYS Performed at [facility], [address] [place], [place] [address]    Report Status PENDING  Incomplete  Culture, blood (routine x 2)     Status: None (Preliminary result)   Collection Time: 06/01/21  6:02 AM   Specimen: BLOOD LEFT HAND  Result Value Ref Range Status   Specimen Description BLOOD LEFT HAND  Final   Special Requests   Final    AEROBIC BOTTLE ONLY Blood Culture results may not be optimal due to an inadequate volume of blood received in culture bottles   Culture   Final    NO GROWTH 4 DAYS Performed at [facility], [address] [place], [place] [address]    Report Status PENDING  Incomplete  Surgical pcr screen     Status: Abnormal   Collection Time: 06/05/21 11:17 AM   Specimen: Nasal Mucosa; Nasal Swab  Result Value Ref Range Status   MRSA, PCR NEGATIVE NEGATIVE Final   Staphylococcus aureus POSITIVE (A) NEGATIVE Final    Comment: (NOTE) The Xpert SA Assay (FDA approved for NASAL specimens in patients 84 years of age and older), is one component of a comprehensive surveillance program. It is not intended to diagnose infection nor to guide or monitor treatment. Performed at [facility], [address] [place], [place] [address]       Studies: No results found.  Scheduled Meds:  insulin aspart  0-9 Units Subcutaneous TID WC   insulin glargine-yfgn  8 Units Subcutaneous Daily   morphine  30 mg Oral BID   multivitamin with minerals  1  tablet Oral Daily   Ensure Max Protein  11 oz Oral QHS   senna-docusate  2 tablet Oral Daily    Continuous Infusions:   ceFAZolin (ANCEF) IV 2 g (06/05/21 0919)     LOS: 8 days     Darlin Drop, MD [facility] Pager [phone]  If 7PM-7AM, please contact night-coverage [email or web address] Password [email or web address] 06/05/2021, 2:05 PM

## 2021-06-05 NOTE — Progress Notes (Signed)
OT Cancellation Note  Patient Details Name: John Ware MRN: [id number] DOB: 08-12-1966   Cancelled Treatment:    Reason Eval/Treat Not Completed: Patient at procedure or test/ unavailable (Surgery. Will return as schedule allows.)  Russie Gulledge M Jermari Tamargo Lindell Tussey MSOT, OTR/L Acute Rehab Pager: [phone] Office: [phone] 06/05/2021, 2:58 PM

## 2021-06-05 NOTE — Anesthesia Postprocedure Evaluation (Signed)
Anesthesia Post Note  Patient: John Ware  Procedure(s) Performed: Left lumbar one-two, Lumbar two-three Laminectomy (Left: Back)     Patient location during evaluation: PACU Anesthesia Type: General Level of consciousness: awake and alert Pain management: pain level controlled Vital Signs Assessment: post-procedure vital signs reviewed and stable Respiratory status: spontaneous breathing, nonlabored ventilation, respiratory function stable and patient connected to nasal cannula oxygen Cardiovascular status: blood pressure returned to baseline and stable Postop Assessment: no apparent nausea or vomiting Anesthetic complications: no   No notable events documented.  Last Vitals:  Vitals:   06/05/21 2000 06/05/21 2018  BP: 110/77 114/73  Pulse: 74 75  Resp: 10 12  Temp: 36.8 C 36.8 C  SpO2: 94% 93%    Last Pain:  Vitals:   06/05/21 2018  TempSrc: Oral  PainSc:                  Cecile Hearing

## 2021-06-06 ENCOUNTER — Inpatient Hospital Stay: Payer: Self-pay

## 2021-06-06 ENCOUNTER — Encounter (HOSPITAL_COMMUNITY): Payer: Self-pay | Admitting: Neurological Surgery

## 2021-06-06 DIAGNOSIS — M4620 Osteomyelitis of vertebra, site unspecified: Secondary | ICD-10-CM | POA: Diagnosis not present

## 2021-06-06 LAB — CULTURE, BLOOD (ROUTINE X 2)
Culture: NO GROWTH
Culture: NO GROWTH

## 2021-06-06 LAB — BASIC METABOLIC PANEL
Anion gap: 9 (ref 5–15)
BUN: 12 mg/dL (ref 6–20)
CO2: 28 mmol/L (ref 22–32)
Calcium: 9 mg/dL (ref 8.9–10.3)
Chloride: 93 mmol/L — ABNORMAL LOW (ref 98–111)
Creatinine, Ser: 0.53 mg/dL — ABNORMAL LOW (ref 0.61–1.24)
GFR, Estimated: >60 mL/min (ref >60)
Glucose, Bld: 313 mg/dL — ABNORMAL HIGH (ref 70–99)
Potassium: 4.7 mmol/L (ref 3.5–5.1)
Sodium: 130 mmol/L — ABNORMAL LOW (ref 135–145)

## 2021-06-06 LAB — CBC
HCT: 39.4 % (ref 39.0–52.0)
Hemoglobin: 12.9 g/dL — ABNORMAL LOW (ref 13.0–17.0)
MCH: 26.7 pg (ref 26.0–34.0)
MCHC: 32.7 g/dL (ref 30.0–36.0)
MCV: 81.4 fL (ref 80.0–100.0)
Platelets: 510 10*3/uL — ABNORMAL HIGH (ref 150–400)
RBC: 4.84 MIL/uL (ref 4.22–5.81)
RDW: 16 % — ABNORMAL HIGH (ref 11.5–15.5)
WBC: 9.2 10*3/uL (ref 4.0–10.5)
nRBC: 0 % (ref 0.0–0.2)

## 2021-06-06 LAB — GLUCOSE, CAPILLARY
Glucose-Capillary: 321 mg/dL — ABNORMAL HIGH (ref 70–99)
Glucose-Capillary: 245 mg/dL — ABNORMAL HIGH (ref 70–99)
Glucose-Capillary: 165 mg/dL — ABNORMAL HIGH (ref 70–99)
Glucose-Capillary: 196 mg/dL — ABNORMAL HIGH (ref 70–99)

## 2021-06-06 MED ORDER — RIFAMPIN 300 MG PO CAPS
300.0000 mg | ORAL_CAPSULE | Freq: Two times a day (BID) | ORAL | Status: DC
  Administered 2021-06-06: 300 mg via ORAL
  Filled 2021-06-06 (×2): qty 1

## 2021-06-06 MED ORDER — METFORMIN HCL ER 500 MG PO TB24
1500.0000 mg | ORAL_TABLET | Freq: Every day | ORAL | Status: DC
  Administered 2021-06-07 – 2021-06-11 (×5): 1500 mg via ORAL
  Filled 2021-06-06 (×5): qty 3

## 2021-06-06 MED ORDER — PROSOURCE PLUS PO LIQD
30.0000 mL | Freq: Two times a day (BID) | ORAL | Status: DC
  Administered 2021-06-06 – 2021-06-11 (×11): 30 mL via ORAL
  Filled 2021-06-06 (×10): qty 30

## 2021-06-06 MED ORDER — HYDROMORPHONE HCL 1 MG/ML IJ SOLN
1.0000 mg | Freq: Four times a day (QID) | INTRAMUSCULAR | Status: DC | PRN
  Administered 2021-06-07 – 2021-06-08 (×4): 1 mg via INTRAVENOUS
  Filled 2021-06-06 (×5): qty 1

## 2021-06-06 NOTE — Progress Notes (Signed)
PT Cancellation Note  Patient Details Name: John Ware MRN: [id number] DOB: Sep 12, 1965   Cancelled Treatment:    Reason Eval/Treat Not Completed: Patient declined, no reason specified (Pt. states that OT was just in room and he worked on standing, does not want to try to stand again with PT at this time.)  Meleena Munroe A. Melanye Hiraldo, PT, DPT Acute Rehabilitation Services Office: [phone]   Alanny Rivers A Akanksha Bellmore 06/06/2021, 1:02 PM

## 2021-06-06 NOTE — Progress Notes (Addendum)
PROGRESS NOTE   John Ware  VQM:[id number] DOB: July 23, 1966 DOA: 05/28/2021 PCP: Junie Spencer, FNP  Brief Narrative:  55 year old white male known history of chronic pain with prior history of lumbar spondylosis stenosis + L3, L4, L5 laminectomy with decompression by 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 undergoing multiple procedures at that time-see discharge summary 03/15/2021 Patient was discharged on IV antibiotic with cefepime and PICC line and was to transition to oral Keflex Patient developed recurrent low back pain associated with LLE weakness MRI lumbar spine showed osteomyelitis and epidural phlegmon at posterior endplate of L1 Patient was transferred to [facility] based course  L1-L3 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 06/05/2021 Defer to infectious disease and neurosurgery Plan seems to be continue cefazolin and follow cultures from OR 10/11 Feel it is okay to place PICC line so order placed Therapy have seen the patient -recommending SNF /CIR Patient has a history of chronic pain resume patient's MS Contin 30 twice daily wean IV opiates as able Continue Oxy IR/Percocet 1 tab every 3 as needed breakthrough. Saline lock 30 cc/8 fluids.today DM TY 2 with neuropathy-and hyperglycemia A1c 8.4 CBGs ranging 2 50-3 20 Continue Semglee 8 units daily, sliding scale coverage Resume gabapentin 800 nightly as needed Will resume metformin 1500 every morning  Jardiance has been held at patient preference Hypotension in setting of hypertension Not on current meds  DVT prophylaxis:  SCD Code Status: Presumed full Family Communication:  Disposition:  Status is: Inpatient  Remains inpatient appropriate because:Hemodynamically unstable, Ongoing diagnostic testing needed not appropriate for outpatient work up, and Unsafe d/c plan  Dispo:  Patient From:  Home  Planned Disposition:  Inpatient Rehab  Medically stable for discharge:  No          Consultants:  Neurosurgery Infectious disease  Procedures:   Laminotomy 06/05/2021 [name]  Antimicrobials:   Cefazolin   Subjective: In moderate spirits Pain is better He has not been able to really move his leg but can move his foot He just saw therapy  Objective: Vitals:   06/05/21 1945 06/05/21 2000 06/05/21 2018 06/06/21 0740  BP: 108/73 110/77 114/73 102/72  Pulse: 72 74 75 68  Resp: 15 10 12 16   Temp:  98.2 F (36.8 C) 98.3 F (36.8 C) 98.6 F (37 C)  TempSrc:   Oral Oral  SpO2: 94% 94% 93% 95%  Weight:      Height:        Intake/Output Summary (Last 24 hours) at 06/06/2021 1336 Last data filed at 06/06/2021 0900 Gross per 24 hour  Intake 1120 ml  Output 450 ml  Net 670 ml   Filed Weights   05/29/21 1756 06/05/21 1431  Weight: 77.1 kg 77.1 kg    Examination:  Awake coherent no distress EOMI NCAT no focal deficit CTA B no rales no rhonchi Abdomen soft no rebound no guarding ROM intact other than when left lower extremity in the leg Euthymic congruent does not appear to be depressed  Data Reviewed: personally reviewed   CBC    Component Value Date/Time   WBC 9.2 06/06/2021 0057   RBC 4.84 06/06/2021 0057   HGB 12.9 (L)  06/06/2021 0057   HGB 11.3 (L) 04/23/2021 1239   HCT 39.4 06/06/2021 0057   HCT 34.1 (L) 04/23/2021 1239   PLT 510 (H) 06/06/2021 0057   PLT 765 (H) 04/23/2021 1239   MCV 81.4 06/06/2021 0057   MCV 80 04/23/2021 1239   MCH 26.7 06/06/2021 0057   MCHC 32.7 06/06/2021 0057   RDW 16.0 (H) 06/06/2021 0057   RDW 14.5 04/23/2021 1239   LYMPHSABS 2.6  05/28/2021 1353   LYMPHSABS 1.7 04/23/2021 1239   MONOABS 0.8 05/28/2021 1353   EOSABS 0.0 05/28/2021 1353   EOSABS 0.1 04/23/2021 1239   BASOSABS 0.0 05/28/2021 1353   BASOSABS 0.0 04/23/2021 1239   CMP Latest Ref Rng & Units 06/06/2021 06/05/2021 06/04/2021  Glucose 70 - 99 mg/dL 578(I) 696(E) 952(W)  BUN 6 - 20 mg/dL 12 15 11   Creatinine 0.61 - 1.24 mg/dL ) 4.13(K) 4.40(N)  Sodium 135 - 145 mmol/L 130(L) 130(L) 130(L)  Potassium 3.5 - 5.1 mmol/L 4.7 4.4 4.5  Chloride 98 - 111 mmol/L 93(L) 94(L) 93(L)  CO2 22 - 32 mmol/L 28 29 27   Calcium 8.9 - 10.3 mg/dL 9.0 9.2 9.3  Total Protein 6.5 - 8.1 g/dL - - -  Total Bilirubin 0.3 - 1.2 mg/dL - - -  Alkaline Phos 38 - 126 U/L - - -  AST 15 - 41 U/L - - -  ALT 0 - 44 U/L - - -     Radiology Studies: DG Lumbar Spine 2-3 Views  Result Date: 06/05/2021 CLINICAL DATA:  L1-L3 laminectomy.  Intraoperative imaging. EXAM: LUMBAR SPINE - 2-3 VIEW COMPARISON:  12/17/2017 FINDINGS: Two fluoroscopic cross-table lateral intraoperative radiographs of the lumbar spine are presented for interpretation postprocedurally. Images are taken at 5:27 p.m. and 5:39 p.m. Initial image demonstrates anterior and posterior lumbar fusion with instrumentation of L3-S1, unchanged from prior examination. Needle-like metallic markers seen within the soft tissues posterior to the posterior elements of L2. Subsequent image demonstrates replacement of the needle-like metallic marker with a metallic probe and soft tissue retractors posterior to L2. IMPRESSION: Intraoperative radiographs as described above. Electronically Signed   By: 08/05/2021 M.D.   On: 06/05/2021 19:52     Scheduled Meds:  Chlorhexidine Gluconate Cloth  6 each Topical Q0600   insulin aspart  0-9 Units Subcutaneous TID WC   insulin glargine-yfgn  8 Units Subcutaneous Daily   morphine  30 mg Oral BID   multivitamin with minerals  1 tablet Oral Daily   mupirocin ointment  1 application Nasal BID    Ensure Max Protein  11 oz Oral QHS   senna-docusate  2 tablet Oral Daily   sodium chloride flush  3 mL Intravenous Q12H   Continuous Infusions:  sodium chloride 250 mL (06/05/21 2221)    ceFAZolin (ANCEF) IV 2 g (06/06/21 0904)   lactated ringers       LOS: 9 days   Time spent: 57  08/06/21, MD [facility] To contact the attending provider between 7A-7P or the covering provider during after hours 7P-7A, please log into the web site [email or web address] and access using universal [place] password for that web site. If you do not have the password, please call the hospital operator.  06/06/2021, 1:36 PM

## 2021-06-06 NOTE — Progress Notes (Signed)
Inpatient Rehab Admissions Coordinator:   Note pain limiting ability to participate in therapies today.  Will f/u with patient tomorrow.   Estill Dooms, PT, DPT Admissions Coordinator [phone] 06/06/21  1:30 PM

## 2021-06-06 NOTE — Progress Notes (Signed)
Patient ID: John Ware, male   DOB: 01-10-1966, 55 y.o.   MRN: [id number] Vitals signs are stable. Patient notes his back feels reasonably comfortable Is not having any improved movement in the left lower extremity This may take some time to see Continues antibiotics

## 2021-06-06 NOTE — Plan of Care (Signed)
  Problem: Education: Goal: Knowledge of General Education information will improve Description: Including pain rating scale, medication(s)/side effects and non-pharmacologic comfort measures Outcome: Progressing   Problem: Health Behavior/Discharge Planning: Goal: Ability to manage health-related needs will improve Outcome: Progressing   Problem: Clinical Measurements: Goal: Ability to maintain clinical measurements within normal limits will improve Outcome: Progressing Goal: Will remain free from infection Outcome: Progressing Goal: Diagnostic test results will improve Outcome: Progressing Goal: Respiratory complications will improve Outcome: Completed/Met Goal: Cardiovascular complication will be avoided Outcome: Completed/Met   Problem: Activity: Goal: Risk for activity intolerance will decrease Outcome: Progressing   Problem: Nutrition: Goal: Adequate nutrition will be maintained Outcome: Progressing   Problem: Coping: Goal: Level of anxiety will decrease Outcome: Progressing   Problem: Elimination: Goal: Will not experience complications related to bowel motility Outcome: Progressing Goal: Will not experience complications related to urinary retention Outcome: Progressing   Problem: Pain Managment: Goal: General experience of comfort will improve Outcome: Progressing   Problem: Safety: Goal: Ability to remain free from injury will improve Outcome: Progressing   Problem: Skin Integrity: Goal: Risk for impaired skin integrity will decrease Outcome: Progressing   Report received from previous shift and care assumed. Progressing towards goals as outlined above. VS obtained, shift assessments completed - see flowsheets. PRN pain medications given as needed - see MAR. Turns and repositions self in bed. Back dressing remains CDI. PICC line to be done tomorrow after clarification about double vs. single lumen. Patient currently resting in bed, bed in lowest position.  Denies needs. Call bell within reach.

## 2021-06-06 NOTE — Progress Notes (Addendum)
I have seen and examined the patient. I have personally reviewed the clinical findings, laboratory findings, microbiological data and imaging studies. The assessment and treatment plan was discussed with the Nurse Practitioner Rexene Alberts. I agree with her/his recommendations except following additions/corrections.  Patient underwent Lumbar laminotomies and decompression L1-2 and L2-3 on the left, decompression of the common dural tube and nerve roots of L1 and L2 and L3. OR findings with significant hyperemia/edematous tissues/yellowish ligament however no pus encountered.   No changes in his neurologic exam post surgery   Continue cefazolin as is Repeat blood cx 10/7 no growth in 5 days  Fu sensi on MSSA from 10/3. Will consider adding Rifampin for hardware in the L spine if MSSA is R sensitive FU OR cultures  Monitor CBC and BMP  OK to place PICC for long term IV abtx Following Neurosurgery recs   Odette Fraction, MD Infectious Disease Physician [facility] [address] [place], [place] [address] Phone: [phone]  Fax: [phone]   [facility]  Date of Admission:  05/28/2021      Total days of antibiotics 10  Cefazolin 10/03 >> c  Rifampin 10/12 >> c         ASSESSMENT: John Ware is a 55 y.o. 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 S/P Laminectomy and epidural debridement 10/11.   MSSA Bacteremia - no growth from repeated cultures, final from 10/6. PICC in place for home OPAT.  Lumbar Osteomyelitis L1-2, L2-3 with new epidural phlegmon - POD1 laminectomy with extensive edematous yellow ligamentous material removed from epidural space at L1-2; noted edematous  material and degraded disc material that was removed but no gross purulence encountered. Cultures from lumbar disc material is pending in micro with negative gram stain. Will consider Rifampin for hardware to see if we can get him through the next 6-12 weeks.  Likely to decrease effect of pain medication (accelerated metabolism) unfortunately so he may need an adjustment in regimen.   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 Mild TV regurgitation remains. No new changes to other valve structures or function. No need to pursue TEE.   Medication Monitoring =  Weekly CBC and CMP    PLAN: Follow micro from OR - suspect MSSA Continue cefazolin.      Principal Problem:   Infection of spine (HCC) Active Problems:   Depression, recurrent (HCC)   Diabetes mellitus (HCC)   Diabetic polyneuropathy associated with type 2 diabetes mellitus (HCC)   HTN (hypertension)   Endocarditis   Psoas abscess (HCC)    Chlorhexidine Gluconate Cloth  6 each Topical Q0600   insulin aspart  0-9 Units Subcutaneous TID WC   insulin glargine-yfgn  8 Units Subcutaneous Daily   morphine  30 mg Oral BID   multivitamin with minerals  1 tablet Oral Daily   mupirocin ointment  1 application Nasal BID   Ensure Max Protein  11 oz Oral QHS   senna-docusate  2 tablet Oral Daily   sodium chloride flush  3 mL Intravenous Q12H    SUBJECTIVE: POD1 from laminectomy - back pain is improved but still weak and unable to move LLE much to gravity. Full equal sensation  with both legs.    Review of Systems: Review of Systems  Constitutional:  Negative for chills and fever.  HENT:  Negative for tinnitus.   Eyes:  Negative for blurred vision and photophobia.  Respiratory:  Negative for cough and sputum production.   Cardiovascular:  Negative for chest pain.  Gastrointestinal:  Negative for diarrhea, nausea and vomiting.  Genitourinary:  Negative for dysuria.   Musculoskeletal:  Positive for back pain.  Skin:  Negative for rash.  Neurological:  Positive for focal weakness. Negative for headaches.    Allergies  Allergen Reactions   Tizanidine Other (See Comments)    Leg cramps    OBJECTIVE: Vitals:   06/05/21 1945 06/05/21 2000 06/05/21 2018 06/06/21 0740  BP: 108/73 110/77 114/73 102/72  Pulse: 72 74 75 68  Resp: 15 10 12 16   Temp:  98.2 F (36.8 C) 98.3 F (36.8 C) 98.6 F (37 C)  TempSrc:   Oral Oral  SpO2: 94% 94% 93% 95%  Weight:      Height:       Body mass index is 24.39 kg/m.  Physical Exam Vitals reviewed.  Constitutional:      Appearance: Normal appearance. He is well-developed. He is not ill-appearing.     Comments: Resting comfortably in bed.   HENT:     Head: Normocephalic.     Mouth/Throat:     Mouth: Mucous membranes are moist.     Dentition: Normal dentition. No dental abscesses.     Pharynx: Oropharynx is clear.  Eyes:     General: No scleral icterus. Cardiovascular:     Rate and Rhythm: Normal rate and regular rhythm.     Heart sounds: Normal heart sounds.  Pulmonary:     Effort: Pulmonary effort is normal.     Breath sounds: Normal breath sounds.  Abdominal:     General: There is no distension.     Palpations: Abdomen is soft.     Tenderness: There is no abdominal tenderness.  Musculoskeletal:        General: Normal range of motion.     Cervical back: Normal range of motion.  Lymphadenopathy:     Cervical: No cervical adenopathy.  Skin:    General: Skin is warm and dry.     Coloration: Skin is not jaundiced or pale.     Findings: No rash.  Neurological:     Mental Status: He is alert and oriented to person, place, and time.  Psychiatric:        Mood and Affect: Mood normal.        Judgment: Judgment normal.    Lab Results Lab Results  Component Value Date   WBC 9.2 06/06/2021   HGB 12.9 (L) 06/06/2021   HCT 39.4 06/06/2021   MCV 81.4 06/06/2021   PLT 510 (H) 06/06/2021    Lab  Results  Component Value Date   CREATININE 0.53 (L) 06/06/2021   BUN 12 06/06/2021   NA 130 (L) 06/06/2021   K 4.7 06/06/2021   CL 93 (L) 06/06/2021   CO2 28 06/06/2021    Lab Results  Component Value Date   ALT 11 06/01/2021   AST 18 06/01/2021   ALKPHOS 106 06/01/2021   BILITOT 0.6 06/01/2021     Microbiology: Recent Results (from the past 240 hour(s))  Culture, blood (Routine X 2) w Reflex to ID Panel     Status: None   Collection Time: 05/28/21  5:20 PM   Specimen: BLOOD  Result Value Ref Range Status   Specimen Description   Final    BLOOD LEFT ANTECUBITAL Performed at [facility], [address] [place], [place] [address]    Special Requests   Final    BOTTLES DRAWN AEROBIC AND ANAEROBIC Blood Culture adequate volume Performed at [facility], [address] [place], [place] [address]    Culture   Final    NO GROWTH 5 DAYS Performed at [facility], [address] [place], [place] [address]    Report Status 06/02/2021 FINAL  Final  Culture, blood (Routine X 2) w Reflex to ID Panel     Status: Abnormal (Preliminary result)   Collection Time: 05/28/21  5:22 PM   Specimen: BLOOD  Result Value Ref Range Status   Specimen Description   Final    BLOOD RIGHT ANTECUBITAL Performed at [facility], [address], [place], [place] [address]    Special Requests   Final    BOTTLES DRAWN AEROBIC AND ANAEROBIC Blood Culture results may not be optimal due to an excessive volume of blood received in culture bottles Performed at [facility], [address] [place], [place] [address]    Culture  Setup Time   Final    GRAM POSITIVE COCCI ANAEROBIC BOTTLE ONLY CRITICAL RESULT CALLED TO, READ BACK BY AND VERIFIED WITH: PHARMD GREG ABBOTT 05/31/2021@2 :56 BY TW    Culture (A)  Final    STAPHYLOCOCCUS AUREUS Sent to [facility] for further susceptibility testing. Performed at [facility], [address], [place], [place] [address]    Report Status PENDING  Incomplete  Blood Culture ID Panel (Reflexed)     Status: Abnormal   Collection Time: 05/28/21  5:22 PM  Result Value Ref Range Status   Enterococcus faecalis NOT DETECTED NOT DETECTED Final   Enterococcus Faecium NOT DETECTED NOT DETECTED Final   Listeria monocytogenes NOT DETECTED NOT DETECTED Final   Staphylococcus species DETECTED (A) NOT DETECTED Final    Comment: CRITICAL RESULT CALLED TO, READ BACK BY AND VERIFIED WITH: PHARMD GREG ABBOTT 05/31/2021@2 :56 BY TW    Staphylococcus aureus (BCID) DETECTED (A) NOT DETECTED Final    Comment: CRITICAL RESULT CALLED TO, READ BACK BY AND VERIFIED WITH: PHARMD GREG ABBOTT 05/31/2021@2 :56 BY TW    Staphylococcus epidermidis NOT DETECTED NOT DETECTED Final   Staphylococcus lugdunensis NOT DETECTED NOT DETECTED Final   Streptococcus species NOT DETECTED NOT DETECTED Final   Streptococcus agalactiae NOT DETECTED NOT DETECTED Final   Streptococcus pneumoniae NOT DETECTED NOT DETECTED Final   Streptococcus pyogenes NOT DETECTED NOT DETECTED Final   A.calcoaceticus-baumannii NOT DETECTED NOT DETECTED Final   Bacteroides fragilis NOT DETECTED NOT DETECTED Final   Enterobacterales NOT DETECTED NOT DETECTED Final   Enterobacter cloacae complex NOT DETECTED NOT DETECTED Final   Escherichia coli NOT DETECTED NOT DETECTED Final   Klebsiella aerogenes NOT DETECTED NOT DETECTED Final   Klebsiella oxytoca NOT DETECTED NOT DETECTED Final   Klebsiella pneumoniae NOT DETECTED NOT DETECTED Final   Proteus species NOT DETECTED NOT DETECTED Final   Salmonella species NOT DETECTED NOT DETECTED Final   Serratia marcescens NOT DETECTED NOT DETECTED Final   Haemophilus influenzae NOT DETECTED NOT DETECTED Final   Neisseria meningitidis NOT DETECTED NOT DETECTED Final   Pseudomonas aeruginosa NOT DETECTED NOT DETECTED Final   Stenotrophomonas maltophilia NOT DETECTED NOT DETECTED Final    Candida albicans NOT DETECTED NOT DETECTED Final  Candida auris NOT DETECTED NOT DETECTED Final   Candida glabrata NOT DETECTED NOT DETECTED Final   Candida krusei NOT DETECTED NOT DETECTED Final   Candida parapsilosis NOT DETECTED NOT DETECTED Final   Candida tropicalis NOT DETECTED NOT DETECTED Final   Cryptococcus neoformans/gattii NOT DETECTED NOT DETECTED Final   Meth resistant mecA/C and MREJ NOT DETECTED NOT DETECTED Final    Comment: Performed at [facility], [address], [place], [place] [address]  Resp Panel by RT-PCR (Flu A&B, Covid) Nasopharyngeal Swab     Status: None   Collection Time: 05/28/21  5:30 PM   Specimen: Nasopharyngeal Swab; Nasopharyngeal(NP) swabs in vial transport medium  Result Value Ref Range Status   SARS Coronavirus 2 by RT PCR NEGATIVE NEGATIVE Final    Comment: (NOTE) SARS-CoV-2 target nucleic acids are NOT DETECTED.  The SARS-CoV-2 RNA is generally detectable in upper respiratory specimens during the acute phase of infection. The lowest concentration of SARS-CoV-2 viral copies this assay can detect is 138 copies/mL. A negative result does not preclude SARS-Cov-2 infection and should not be used as the sole basis for treatment or other patient management decisions. A negative result may occur with  improper specimen collection/handling, submission of specimen other than nasopharyngeal swab, presence of viral mutation(s) within the areas targeted by this assay, and inadequate number of viral copies(<138 copies/mL). A negative result must be combined with clinical observations, patient history, and epidemiological information. The expected result is Negative.  Fact Sheet for Patients:  BloggerCourse.com  Fact Sheet for Healthcare Providers:  SeriousBroker.it  This test is no t yet approved or cleared by the [place] FDA and  has been authorized for detection and/or diagnosis of  SARS-CoV-2 by FDA under an Emergency Use Authorization (EUA). This EUA will remain  in effect (meaning this test can be used) for the duration of the COVID-19 declaration under Section 564(b)(1) of the Act, 21 U.S.C.section 360bbb-3(b)(1), unless the authorization is terminated  or revoked sooner.       Influenza A by PCR NEGATIVE NEGATIVE Final   Influenza B by PCR NEGATIVE NEGATIVE Final    Comment: (NOTE) The Xpert Xpress SARS-CoV-2/FLU/RSV plus assay is intended as an aid in the diagnosis of influenza from Nasopharyngeal swab specimens and should not be used as a sole basis for treatment. Nasal washings and aspirates are unacceptable for Xpert Xpress SARS-CoV-2/FLU/RSV testing.  Fact Sheet for Patients: BloggerCourse.com  Fact Sheet for Healthcare Providers: SeriousBroker.it  This test is not yet approved or cleared by the [place] FDA and has been authorized for detection and/or diagnosis of SARS-CoV-2 by FDA under an Emergency Use Authorization (EUA). This EUA will remain in effect (meaning this test can be used) for the duration of the COVID-19 declaration under Section 564(b)(1) of the Act, 21 U.S.C. section 360bbb-3(b)(1), unless the authorization is terminated or revoked.  Performed at [facility], [address], [place], [place] [address]   Culture, blood (routine x 2)     Status: None   Collection Time: 06/01/21  6:02 AM   Specimen: BLOOD RIGHT HAND  Result Value Ref Range Status   Specimen Description BLOOD RIGHT HAND  Final   Special Requests   Final    AEROBIC BOTTLE ONLY Blood Culture results may not be optimal due to an inadequate volume of blood received in culture bottles   Culture   Final    NO GROWTH 5 DAYS Performed at [facility], [address], [place], [place] [address]    Report Status 06/06/2021 FINAL  Final  Culture, blood (routine x 2)     Status: None    Collection Time: 06/01/21  6:02 AM   Specimen: BLOOD LEFT HAND  Result Value Ref Range Status   Specimen Description BLOOD LEFT HAND  Final   Special Requests   Final    AEROBIC BOTTLE ONLY Blood Culture results may not be optimal due to an inadequate volume of blood received in culture bottles   Culture   Final    NO GROWTH 5 DAYS Performed at [facility], [address], [place], [place] [address]    Report Status 06/06/2021 FINAL  Final  Surgical pcr screen     Status: Abnormal   Collection Time: 06/05/21 11:17 AM   Specimen: Nasal Mucosa; Nasal Swab  Result Value Ref Range Status   MRSA, PCR NEGATIVE NEGATIVE Final   Staphylococcus aureus POSITIVE (A) NEGATIVE Final    Comment: (NOTE) The Xpert SA Assay (FDA approved for NASAL specimens in patients 38 years of age and older), is one component of a comprehensive surveillance program. It is not intended to diagnose infection nor to guide or monitor treatment. Performed at [facility], [address] [place], [place] [address]   Aerobic/Anaerobic Culture w Gram Stain (surgical/deep wound)     Status: None (Preliminary result)   Collection Time: 06/05/21  6:11 PM   Specimen: Soft Tissue, Other  Result Value Ref Range Status   Specimen Description WOUND LUMBAR  Final   Special Requests LUMBAR DISC PT ON ANCEF  Final   Gram Stain   Final    FEW SQUAMOUS EPITHELIAL CELLS PRESENT MODERATE WBC SEEN NO ORGANISMS SEEN Performed at [facility], [address] [place], [place] [address]    Culture PENDING  Incomplete   Report Status PENDING  Incomplete    Rexene Alberts, MSN, NP-C [facility] Pager: [phone]  06/06/2021  8:52 AM

## 2021-06-06 NOTE — Progress Notes (Signed)
Arrived to discuss PICC placement including risks, benefits, and alternatives. Patient preferred to wait until 10-13 AM perform procedure. Primary RN notified, VAST to follow up on 10-13 AM.

## 2021-06-06 NOTE — Progress Notes (Signed)
Inpatient Diabetes Program Recommendations  AACE/ADA: New Consensus Statement on Inpatient Glycemic Control (2015)  Target Ranges:  Prepandial:   less than 140 mg/dL      Peak postprandial:   less than 180 mg/dL (1-2 hours)      Critically ill patients:  140 - 180 mg/dL   Lab Results  Component Value Date   GLUCAP 321 (H) 06/06/2021   HGBA1C 8.4 (H) 05/28/2021    Review of Glycemic Control Results for John Ware, John Ware (MRN [id number]) as of 06/06/2021 09:30  Ref. Range 06/04/2021 20:25 06/05/2021 06:28 06/05/2021 11:28 06/05/2021 16:53 06/05/2021 19:02 06/05/2021 19:04 06/06/2021 06:35  Glucose-Capillary Latest Ref Range: 70 - 99 mg/dL 045 (H) 997 (H) 741 (H) 104 (H) 181 (H) 174 (H) 321 (H)   Diabetes history: DM 2 Outpatient Diabetes medications: metformin 1500 mg Daily  Current orders for Inpatient glycemic control:  Semglee 8 units Novolog 0-9 units  A1c 8.4% on 10/3  Inpatient Diabetes Program Recommendations:    Note: Pt received Decadron 10 mg yesterday during OR procedure resulting in Glucose >300  -  Consider increasing Novolog 0-20 units for today  - Increase Semglee to 12 units  Thanks,  Christena Deem RN, MSN, BC-ADM Inpatient Diabetes Coordinator Team Pager [phone] (8a-5p)

## 2021-06-06 NOTE — Progress Notes (Signed)
PT Cancellation Note  Patient Details Name: John Ware MRN: [id number] DOB: 1965-11-07   Cancelled Treatment:    Reason Eval/Treat Not Completed: Pain limiting ability to participate (Pt. states he has had no change in LE function since surgery last night; does not want to do therapy at this time.  Wants to wait until after pain medicine.  Apparently also refused OT this AM. PT to f/u as time permits.)  Sujata Maines A. Carey Lafon, PT, DPT Acute Rehabilitation Services Office: [phone]  Teiara Baria A Mariafernanda Hendricksen 06/06/2021, 10:08 AM

## 2021-06-06 NOTE — Progress Notes (Signed)
Occupational Therapy Re-Evaluation  Pt presents with decreased balance/mobility/strength/activity tolerance and pain. Pt appears to have further declined in function and was unable to attempt any steps with RW during eval due to weakness and inability to bear weight through LLE. Pt now requiring increased assist with LB ADLs and functional transfers. Pt would likely benefit from CIR to improve safety/independence with ADLs prior to return home. Will continue to follow acutely.   06/06/21 0809  OT Visit Information  Last OT Received On 06/06/21  Assistance Needed +1 (close chair follow)     History of Present Illness Patient is a 55 y/o male who presents on 05/28/21 with LLE weakness and pain. 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 S/p lumbar laminotomies and decompression L1-2 and L2-3 on the left decompression of the common dural tube and nerve roots of L1 and L2 and L3 on 06/05/21. PMH includes HTN, chronic pain, DM, COVID, MSSA bacteremia, depression.  Precautions  Precautions Fall;Back  Precaution Booklet Issued No  Restrictions  Weight Bearing Restrictions No  Home Living  Family/patient expects to be discharged to: Private residence  Living Arrangements Spouse/significant other  Available Help at Discharge Family  Type of Home House  Home Access Level entry  Home Layout One level  Bathroom Shower/Tub Tub/shower unit  Bathroom Toilet Handicapped height  Bathroom Accessibility Yes  Home Equipment Shower seat;Cane - single point;Walker - 4 wheels     Prior Function  Level of Independence Independent  Comments ADLs, IADLs, retired. Using cane and RW once the pain in LLE  and bil hips started  Communication  Communication No difficulties  Pain Assessment  Pain  Assessment Faces  Faces Pain Scale 6  Pain Location back, hips  Pain Descriptors / Indicators Constant;Discomfort;Sore;Guarding  Pain Intervention(s) Monitored during session;Limited activity within patient's tolerance;Repositioned  Cognition  Arousal/Alertness Awake/alert  Behavior During Therapy WFL for tasks assessed/performed  Overall Cognitive Status Within Functional Limits for tasks assessed  Upper Extremity Assessment  Upper Extremity Assessment Generalized weakness  Lower Extremity Assessment  Lower Extremity Assessment Defer to PT evaluation  Cervical / Trunk Assessment  Cervical / Trunk Assessment Other exceptions  Cervical / Trunk Exceptions s/p back surgery  ADL  Overall ADL's  Needs assistance/impaired  Eating/Feeding Set up;Sitting  Grooming Set up;Sitting  Upper Body Bathing Supervision/ safety;Set up;Sitting  Lower Body Bathing Moderate assistance;Sitting/lateral leans;Sit to/from stand  Upper Body Dressing  Supervision/safety;Set up  Upper Body Dressing Details (indicate cue type and reason) Limited ROM due to prior shoudler sx  Lower Body Dressing Moderate assistance;Sitting/lateral leans  Lower Body Dressing Details (indicate cue type and reason) Mod A for LB dressing due to limited ROM with pain  Toilet Transfer Moderate assistance;Stand-pivot;BSC;RW (simulated to recliner)  General ADL Comments Pt with trace movement in LLE and unable to bear weight through LLE during standing for ADLs. Pt also stating that his RLE is weaker than before admission.  Vision- History  Baseline Vision/History 1 Wears glasses  Patient Visual Report No change from baseline  Bed Mobility  Overal bed mobility Needs Assistance  Bed Mobility Rolling;Sidelying to Sit  Rolling Supervision  Sidelying to sit Min assist;HOB elevated  Sit to sidelying Min assist;HOB elevated  Balance  Overall balance assessment Needs assistance  Sitting-balance support Feet supported;No upper extremity  supported  Sitting balance-[name] Good  Standing balance support During functional activity  Standing balance-[name] Poor  OT - End of Session  Equipment Utilized During Treatment Rolling walker;Gait belt  Activity Tolerance Patient limited by fatigue;Patient limited by pain  Patient left in bed;with call bell/phone within reach  Nurse Communication Mobility status  OT Assessment  OT Recommendation/Assessment Patient needs continued OT Services  OT Visit Diagnosis Unsteadiness on feet (R26.81);Other abnormalities of gait and mobility (R26.89);Muscle weakness (generalized) (M62.81);Pain  Pain - Right/Left Left  Pain - part of body Hip  OT Problem List Decreased strength;Decreased range of motion;Decreased activity tolerance;Impaired balance (sitting and/or standing);Decreased safety awareness;Decreased knowledge of precautions;Decreased knowledge of use of DME or AE;Pain  OT Plan  OT Frequency (ACUTE ONLY) Min 2X/week  OT Treatment/Interventions (ACUTE ONLY) Self-care/ADL training;Therapeutic exercise;Energy conservation;DME and/or AE instruction;Therapeutic activities;Patient/family education  AM-PAC OT "6 Clicks" Daily Activity Outcome Measure (Version 2)  Help from another person eating meals? 4  Help from another person taking care of personal grooming? 3  Help from another person toileting, which includes using toliet, bedpan, or urinal? 2  Help from another person bathing (including washing, rinsing, drying)? 2  Help from another person to put on and taking off regular upper body clothing? 3  Help from another person to put on and taking off regular lower body clothing? 2  6 Click Score 16  Progressive Mobility  What is the highest level of mobility based on the progressive mobility assessment? Level 3 (Stands with assist) - Balance while standing  and cannot march in place  Mobility Sit up in bed/chair position for meals  OT Recommendation  Recommendations for Other  Services PT consult  Follow Up Recommendations CIR;Supervision/Assistance - 24 hour  OT Equipment 3 in 1 bedside commode  Individuals Consulted  Consulted and Agree with Results and Recommendations Patient  Acute Rehab OT Goals  Patient Stated Goal Reduce pain  OT Goal Formulation With patient  Time For Goal Achievement 06/20/21  Potential to Achieve Goals Good  OT Time Calculation  OT Start Time (ACUTE ONLY) 1218  OT Stop Time (ACUTE ONLY) 1237  OT Time Calculation (min) 19 min  OT General Charges  $OT Visit 1 Visit  OT Evaluation  $OT Re-eval 1 Re-eval  Written Expression  Dominant Hand Left   Shreeya Recendiz C, OT/L  Acute Rehab [phone]

## 2021-06-07 DIAGNOSIS — M4620 Osteomyelitis of vertebra, site unspecified: Secondary | ICD-10-CM | POA: Diagnosis not present

## 2021-06-07 LAB — CBC
HCT: 33.1 % — ABNORMAL LOW (ref 39.0–52.0)
Hemoglobin: 10.9 g/dL — ABNORMAL LOW (ref 13.0–17.0)
MCH: 26.5 pg (ref 26.0–34.0)
MCHC: 32.9 g/dL (ref 30.0–36.0)
MCV: 80.3 fL (ref 80.0–100.0)
Platelets: 496 10*3/uL — ABNORMAL HIGH (ref 150–400)
RBC: 4.12 MIL/uL — ABNORMAL LOW (ref 4.22–5.81)
RDW: 15.8 % — ABNORMAL HIGH (ref 11.5–15.5)
WBC: 9.6 10*3/uL (ref 4.0–10.5)
nRBC: 0 % (ref 0.0–0.2)

## 2021-06-07 LAB — BASIC METABOLIC PANEL
Anion gap: 10 (ref 5–15)
BUN: 13 mg/dL (ref 6–20)
CO2: 26 mmol/L (ref 22–32)
Calcium: 8.7 mg/dL — ABNORMAL LOW (ref 8.9–10.3)
Chloride: 95 mmol/L — ABNORMAL LOW (ref 98–111)
Creatinine, Ser: 0.55 mg/dL — ABNORMAL LOW (ref 0.61–1.24)
GFR, Estimated: >60 mL/min (ref >60)
Glucose, Bld: 176 mg/dL — ABNORMAL HIGH (ref 70–99)
Potassium: 3.8 mmol/L (ref 3.5–5.1)
Sodium: 131 mmol/L — ABNORMAL LOW (ref 135–145)

## 2021-06-07 LAB — GLUCOSE, CAPILLARY
Glucose-Capillary: 160 mg/dL — ABNORMAL HIGH (ref 70–99)
Glucose-Capillary: 214 mg/dL — ABNORMAL HIGH (ref 70–99)
Glucose-Capillary: 102 mg/dL — ABNORMAL HIGH (ref 70–99)
Glucose-Capillary: 149 mg/dL — ABNORMAL HIGH (ref 70–99)

## 2021-06-07 MED ORDER — SODIUM CHLORIDE 0.9% FLUSH: 10.0000 mL | INTRAVENOUS | Status: DC | PRN

## 2021-06-07 MED ORDER — ALPRAZOLAM 0.25 MG PO TABS
0.2500 mg | ORAL_TABLET | Freq: Once | ORAL | Status: AC
  Administered 2021-06-09: 0.25 mg via ORAL
  Filled 2021-06-07 (×2): qty 1

## 2021-06-07 MED ORDER — SODIUM CHLORIDE 0.9% FLUSH
10.0000 mL | Freq: Two times a day (BID) | INTRAVENOUS | Status: DC
Start: 2021-06-07 — End: 2021-06-11
  Administered 2021-06-07 – 2021-06-11 (×7): 10 mL

## 2021-06-07 NOTE — Progress Notes (Signed)
Physical Therapy Treatment Patient Details Name: John Ware MRN: [id number] DOB: 1965/12/03 Today's Date: 06/07/2021   History of Present Illness Patient is a 54 y/o male who presents on 05/28/21 with LLE weakness and pain. 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 S/p lumbar laminotomies and decompression L1-2 and L2-3 on the left decompression of the common dural tube and nerve roots of L1 and L2 and L3 on 06/05/21. PMH includes HTN, chronic pain, DM, COVID, MSSA bacteremia, depression.    PT Comments    Patient received in bed, pleasant and cooperative with therapy. Used stedy today for safety- he reports he was very unsteady with nursing staff earlier today and would prefer second person present for attempts at transferring/gait training with walker. Able to perform bed mobility and transfers with MinA, but still very weak. Needed frequent reminders to maintain precautions functionally. Left up in recliner with all needs met, chair alarm active. Continue to recommend CIR- would really benefit from intensive therapy services to enhance mobility and reduce fall risk moving forward.     Recommendations for follow up therapy are one component of a multi-disciplinary discharge planning process, led by the attending physician.  Recommendations may be updated based on patient status, additional functional criteria and insurance authorization.  Follow Up Recommendations  CIR;Supervision for mobility/OOB     Equipment Recommendations  Rolling walker with 5" wheels;Wheelchair (measurements PT);Wheelchair cushion (measurements PT) (might be able to borrow a WC)    Recommendations for Other Services       Precautions / Restrictions Precautions Precautions: Fall;Back Precaution  Booklet Issued: No Precaution Comments: Use of log roll for comfort, verbally reviewed precautions Restrictions Weight Bearing Restrictions: No     Mobility  Bed Mobility Overal bed mobility: Needs Assistance Bed Mobility: Rolling;Sidelying to Sit Rolling: Min assist Sidelying to sit: Min assist;HOB elevated       General bed mobility comments: needed MinA to prevent twisting when rolling, also MinA to boost all the way up to midline sitting. Steady once at EOB with BUE support.    Transfers Overall transfer level: Needs assistance Equipment used: Ambulation equipment used Transfers: Sit to/from Stand Sit to Stand: Min assist         General transfer comment: MinA to power up to full upright in the steady; refused to try RW without second person due to being very unsteady with nursing staff earlier  Ambulation/Gait             General Gait Details: deferred- will need chair follow/second person for safety   Stairs             Wheelchair Mobility    Modified Rankin (Stroke Patients Only)       Balance Overall balance assessment: Needs assistance Sitting-balance support: Bilateral upper extremity supported;Feet supported Sitting balance-[name]: Good Sitting balance - Comments: Leaning forward on legs to stretch out back reporting comfort and ease of pain;  prefers to have BUE support   Standing balance support: During functional activity Standing balance-[name]: Poor Standing balance comment: reliant on BUE support                            Cognition Arousal/Alertness: Awake/alert Behavior During Therapy: [facility] for tasks assessed/performed Overall Cognitive  Status: Within Functional Limits for tasks assessed                                 General Comments: decreased recall of precautions- needed repeated cues throughout session to functionally maintain      Exercises      General Comments General comments  (skin integrity, edema, etc.): hip flexors 2/5, R quad 4/5, L quad 2-/5 at best, R DF 4/5, L DF 3+/5      Pertinent Vitals/Pain Pain Assessment: 0-10 Pain Score: 5  Pain Location: back, hips Pain Descriptors / Indicators: Constant;Discomfort;Sore;Guarding Pain Intervention(s): Limited activity within patient's tolerance;Monitored during session;Repositioned    Home Living                      Prior Function            PT Goals (current goals can now be found in the care plan section) Acute Rehab PT Goals Patient Stated Goal: Reduce pain PT Goal Formulation: With patient Time For Goal Achievement: 06/15/21 Potential to Achieve Goals: Good Progress towards PT goals: Progressing toward goals (slowly)    Frequency    Min 3X/week      PT Plan Current plan remains appropriate    Co-evaluation              AM-PAC PT "6 Clicks" Mobility   Outcome Measure  Help needed turning from your back to your side while in a flat bed without using bedrails?: A Little Help needed moving from lying on your back to sitting on the side of a flat bed without using bedrails?: A Little Help needed moving to and from a bed to a chair (including a wheelchair)?: A Lot Help needed standing up from a chair using your arms (e.g., wheelchair or bedside chair)?: A Lot Help needed to walk in hospital room?: A Lot Help needed climbing 3-5 steps with a railing? : Total 6 Click Score: 13    End of Session Equipment Utilized During Treatment: Gait belt Activity Tolerance: Patient tolerated treatment well;Patient limited by pain Patient left: in chair;with call bell/phone within reach;with chair alarm set Nurse Communication: Mobility status PT Visit Diagnosis: Pain;Muscle weakness (generalized) (M62.81) Pain - Right/Left: Left Pain - part of body:  (back/hips)     Time: 7471-5953 PT Time Calculation (min) (ACUTE ONLY): 22 min  Charges:  $Therapeutic Activity: 8-22 mins                     Windell Norfolk, DPT, PN2   Supplemental Physical Therapist [place]    Pager [phone] Acute Rehab Office [phone]

## 2021-06-07 NOTE — Plan of Care (Signed)
  Problem: Education: Goal: Knowledge of General Education information will improve Description: Including pain rating scale, medication(s)/side effects and non-pharmacologic comfort measures Outcome: Progressing   Problem: Health Behavior/Discharge Planning: Goal: Ability to manage health-related needs will improve Outcome: Progressing   Problem: Clinical Measurements: Goal: Ability to maintain clinical measurements within normal limits will improve Outcome: Progressing Goal: Will remain free from infection Outcome: Progressing Goal: Diagnostic test results will improve Outcome: Progressing   Problem: Nutrition: Goal: Adequate nutrition will be maintained Outcome: Progressing   Problem: Activity: Goal: Risk for activity intolerance will decrease Outcome: Progressing   Problem: Coping: Goal: Level of anxiety will decrease Outcome: Progressing   Problem: Elimination: Goal: Will not experience complications related to bowel motility Outcome: Progressing Goal: Will not experience complications related to urinary retention Outcome: Progressing   Problem: Pain Managment: Goal: General experience of comfort will improve Outcome: Progressing   Problem: Safety: Goal: Ability to remain free from injury will improve Outcome: Progressing   Problem: Skin Integrity: Goal: Risk for impaired skin integrity will decrease Outcome: Progressing   Report received and care assumed from previous shift. Progressing towards goals as outlined above. VS obtained, shift assessments completed - see flowsheets. PRN pain medications given as needed - see MAR. OOB with moderate assist and stedy tool, tolerated activity fair. Back incision open to air, edges well approximated, peri-wound skin slightly red but blanchable. PICC line dressing CDI. MRI ordered per neurosurgery, MRI aware of orders and awaiting table time; oncall dose of claustrophobia medicine available. Patient currently resting in bed,  bed in lowest position. Denies needs. Call bell within reach.

## 2021-06-07 NOTE — Progress Notes (Signed)
PROGRESS NOTE   John Ware  ZJQ:[id number] DOB: 10-22-1965 DOA: 05/28/2021 PCP: Junie Spencer, FNP  Brief Narrative:  55 year old white male known history of chronic pain with prior history of lumbar spondylosis stenosis + L3, L4, L5 laminectomy with decompression by 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 undergoing multiple procedures at that time-see discharge summary 03/15/2021 Patient was discharged on IV antibiotic with cefepime and PICC line and was to transition to oral Keflex Patient developed recurrent low back pain associated with LLE weakness MRI lumbar spine showed osteomyelitis and epidural phlegmon at posterior endplate of L1 Patient was transferred to [facility] based course  L1-L3 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 06/05/2021 Defer to infectious disease and neurosurgery Plan seems to be continue cefazolin and follow cultures from OR 10/11 PICC placed 10/13 Therapy recommending SNF /CIR--- await further work-up from social work and [facility] Patient has a history of chronic pain resume patient's MS Contin 30 twice daily-IV Dilaudid stopped 10/ Continue Oxy IR/Percocet 1 tab every 3 as needed breakthrough. Saline locked on 10/12 DM TY 2 with neuropathy-and hyperglycemia A1c 8.4 CBGs improved from 1 60-1 90 Continue Semglee 8 units daily, sliding scale coverage Resume gabapentin 800 nightly as needed Will resume metformin 1500 every morning  Jardiance has been held at patient preference Hypotension in setting of hypertension Not on current meds  DVT prophylaxis: SCD Code  Status: Presumed full Family Communication:  Disposition:  Status is: Inpatient  Remains inpatient appropriate because:Hemodynamically unstable, Ongoing diagnostic testing needed not appropriate for outpatient work up, and Unsafe d/c plan  Dispo:  Patient From:  Home  Planned Disposition:  Inpatient Rehab  Medically stable for discharge:  No          Consultants:  Neurosurgery Infectious disease  Procedures:   Laminotomy 06/05/2021 [name]  Antimicrobials:   Cefazolin   Subjective:  Still has immobile left lower extremity and needs significant assistance with No chest pain No fever  Objective: Vitals:   06/06/21 0740 06/06/21 1422 06/06/21 2049 06/07/21 0752  BP: 102/72 110/78 120/81 130/84  Pulse: 68 71 73 74  Resp: 16 14 16 16   Temp: 98.6 F (37 C) 97.8 F (36.6 C) 98.8 F (37.1 C) 99.1 F (37.3 C)  TempSrc: Oral Oral Oral Oral  SpO2: 95% 99% 97% 97%  Weight:      Height:        Intake/Output Summary (Last 24 hours) at 06/07/2021 1035 Last data filed at 06/07/2021 0518 Gross per 24 hour  Intake 631.65 ml  Output 1950 ml  Net -1318.35 ml    Filed Weights   05/29/21 1756 06/05/21 1431  Weight: 77.1 kg 77.1 kg    Examination:  coherent no distress EOMI NCAT no focal deficit CTA B no rales no rhonchi Abdomen soft no rebound no guarding ROM intact but significant weakness left lower extremity Euthymic congruent   Data Reviewed: personally reviewed   CBC    Component Value Date/Time   WBC 9.6 06/07/2021 0330   RBC 4.12 (L) 06/07/2021 0330   HGB 10.9 (L) 06/07/2021 0330   HGB 11.3 (L) 04/23/2021 1239   HCT 33.1 (L) 06/07/2021 0330  HCT 34.1 (L) 04/23/2021 1239   PLT 496 (H) 06/07/2021 0330   PLT 765 (H) 04/23/2021 1239   MCV 80.3 06/07/2021 0330   MCV 80 04/23/2021 1239   MCH 26.5 06/07/2021 0330   MCHC 32.9 06/07/2021 0330   RDW 15.8 (H) 06/07/2021 0330   RDW 14.5 04/23/2021 1239   LYMPHSABS 2.6 05/28/2021 1353    LYMPHSABS 1.7 04/23/2021 1239   MONOABS 0.8 05/28/2021 1353   EOSABS 0.0 05/28/2021 1353   EOSABS 0.1 04/23/2021 1239   BASOSABS 0.0 05/28/2021 1353   BASOSABS 0.0 04/23/2021 1239   CMP Latest Ref Rng & Units 06/07/2021 06/06/2021 06/05/2021  Glucose 70 - 99 mg/dL 161(W) 960(A) 540(J)  BUN 6 - 20 mg/dL 13 12 15   Creatinine 0.61 - 1.24 mg/dL ) 8.11(B) 1.47(W)  Sodium 135 - 145 mmol/L 131(L) 130(L) 130(L)  Potassium 3.5 - 5.1 mmol/L 3.8 4.7 4.4  Chloride 98 - 111 mmol/L 95(L) 93(L) 94(L)  CO2 22 - 32 mmol/L 26 28 29   Calcium 8.9 - 10.3 mg/dL 2.95(A) 9.0 9.2  Total Protein 6.5 - 8.1 g/dL - - -  Total Bilirubin 0.3 - 1.2 mg/dL - - -  Alkaline Phos 38 - 126 U/L - - -  AST 15 - 41 U/L - - -  ALT 0 - 44 U/L - - -     Radiology Studies: DG Lumbar Spine 2-3 Views  Result Date: 06/05/2021 CLINICAL DATA:  L1-L3 laminectomy.  Intraoperative imaging. EXAM: LUMBAR SPINE - 2-3 VIEW COMPARISON:  12/17/2017 FINDINGS: Two fluoroscopic cross-table lateral intraoperative radiographs of the lumbar spine are presented for interpretation postprocedurally. Images are taken at 5:27 p.m. and 5:39 p.m. Initial image demonstrates anterior and posterior lumbar fusion with instrumentation of L3-S1, unchanged from prior examination. Needle-like metallic markers seen within the soft tissues posterior to the posterior elements of L2. Subsequent image demonstrates replacement of the needle-like metallic marker with a metallic probe and soft tissue retractors posterior to L2. IMPRESSION: Intraoperative radiographs as described above. Electronically Signed   By: 08/05/2021 M.D.   On: 06/05/2021 19:52   Helyn Numbers EKG SITE RITE  Result Date: 06/06/2021 If Site Rite image not attached, placement could not be confirmed due to current cardiac rhythm.    Scheduled Meds:  (feeding supplement) PROSource Plus  30 mL Oral BID BM   Chlorhexidine Gluconate Cloth  6 each Topical Q0600   insulin aspart  0-9 Units  Subcutaneous TID WC   insulin glargine-yfgn  8 Units Subcutaneous Daily   metFORMIN  1,500 mg Oral Q breakfast   morphine  30 mg Oral BID   multivitamin with minerals  1 tablet Oral Daily   mupirocin ointment  1 application Nasal BID   Ensure Max Protein  11 oz Oral QHS   senna-docusate  2 tablet Oral Daily   sodium chloride flush  10-40 mL Intracatheter Q12H   sodium chloride flush  3 mL Intravenous Q12H   Continuous Infusions:  sodium chloride 250 mL (06/05/21 2221)    ceFAZolin (ANCEF) IV 2 g (06/07/21 0231)   lactated ringers       LOS: 10 days   Time spent: 47  06/09/21, MD [facility] To contact the attending provider between 7A-7P or the covering provider during after hours 7P-7A, please log into the web site [email or web address] and access using universal [place] password for that web site. If you do not have the password, please call the hospital operator.  06/07/2021, 10:35 AM

## 2021-06-07 NOTE — Progress Notes (Signed)
Inpatient Rehab Admissions Coordinator:   Met with patient at bedside to discuss CIR.  He remains open to rehab and goals/expectations.  Spouse is home during they day and can help some if needed. I expect he will be supervision level in a w/c and up to min assist level with a RW.  I will need insurance authorization for CIR and I will start this process today.   Shann Medal, PT, DPT Admissions Coordinator [phone] 06/07/21  12:15 PM

## 2021-06-07 NOTE — Progress Notes (Signed)
Peripherally Inserted Central Catheter Placement  The IV Nurse has discussed with the patient and/or persons authorized to consent for the patient, the purpose of this procedure and the potential benefits and risks involved with this procedure.  The benefits include less needle sticks, lab draws from the catheter, and the patient may be discharged home with the catheter. Risks include, but not limited to, infection, bleeding, blood clot (thrombus formation), and puncture of an artery; nerve damage and irregular heartbeat and possibility to perform a PICC exchange if needed/ordered by physician.  Alternatives to this procedure were also discussed.  Bard Power PICC patient education guide, fact sheet on infection prevention and patient information card has been provided to patient /or left at bedside.    PICC Placement Documentation  PICC Double Lumen 06/07/21 PICC Right Brachial 41 cm 1 cm (Active)  Indication for Insertion or Continuance of Line Prolonged intravenous therapies 06/07/21 1005  Exposed Catheter (cm) 0 cm 06/07/21 1005  Site Assessment Dry;Clean;Intact 06/07/21 1005  Lumen #1 Status Flushed;Blood return noted;Saline locked 06/07/21 1005  Lumen #2 Status Flushed;Blood return noted;Saline locked 06/07/21 1005  Dressing Type Transparent 06/07/21 1005  Dressing Status Clean;Dry;Intact 06/07/21 1005  Antimicrobial disc in place? Yes 06/07/21 1005  Dressing Change Due 06/14/21 06/07/21 1005       John Ware 06/07/2021, 10:10 AM

## 2021-06-07 NOTE — Progress Notes (Signed)
Patient ID: John Ware, male   DOB: [birth year], 55 y.o.   MRN: [id number] [name] seems to be stalled in any recovery since his decompressive surgery couple days ago His left leg is no better and he notes that he is unable to stand or walk by himself He may need a new imaging study to further evaluate his lumbar spine I will order an MRI with and without gadolinium of the lumbar spine.

## 2021-06-07 NOTE — PMR Pre-admission (Signed)
PMR Admission Coordinator Pre-Admission Assessment  Patient: John Ware is an 55 y.o., male MRN: [id number] DOB: 09-24-1965 Height: _0  (177.8 cm) Weight: 77.1 kg  Insurance Information HMO:     PPO: yes     PCP:      IPA:      80/20:      OTHER:  PRIMARY: UMR  Policy#: [id number]      Subscriber: pt CM Name: Erline Levine      Phone#: [phone]    Fax#: [phone] Pre-Cert#: [id number] Josem Kaufmann for CIR from [facility] [place] [facility]  with updates due to [facility]  at fax listed above on 06/10/21     Pt. Approved for admission 10/17 for 7 days with updates due 06/18/21  Employer:  Benefits:  Phone #: [phone]     Name:  Eff. Date: 05/26/18     Deduct: $1500 met      Out of Pocket Max: $4000 met      Life Max: n/a CIR: 80%      SNF: 80% Outpatient: 80%     Co-Ins: 20% Home Health: 80%      Co-Ins: 20% DME: 80%     Co-Ins: 20% Providers:  SECONDARY:       Policy#:      Phone#:   Development worker, community:       Phone#:   The Therapist, art Information Summary" for patients in Inpatient Rehabilitation Facilities with attached "Privacy Act [place] Records" was provided and verbally reviewed with: N/A  Emergency Contact Information Contact Information     Name Relation Home Work Mobile   [place] Spouse [phone]  [phone]   Fricke,Francis Mother [phone]     Gasper Lloyd Niece   [phone]       Current Medical History  Patient Admitting Diagnosis: discitis, osteomyelitis, lumbar region  History of Present Illness: Pt is a 55 y/o male with PMH of HTN, chronic pain, DM, covid, MSSA bacteremia, and depression admitted to [facility] on 10/3 with ongoing c/o LLE weakness and pain.  Pt with recent history and prolonged hospital stay following interbody fusion C7-E9 complicated by MSSA bacteremia and osteomyelitis.  Workup revealed recurrent osteomyelitis with new epidural phlegmon.  On 10/12 pt underwent L1-3 lumbar laminotomies and decompression with dural tube  and nerve root decompression per Dr. Ellene Route.  Post op course pain management and pt was recommended for CIR by therapy.      Patient's medical record from [facility] has been reviewed by the rehabilitation admission coordinator and physician.  Past Medical History  Past Medical History:  Diagnosis Date   Depression    Diabetes mellitus without complication ([place])    Hypertension    Spinal stenosis    With Neurogenic Claudication    Has the patient had major surgery during 100 days prior to admission? Yes  Family History   family history is not on file.  Current Medications  Current Facility-Administered Medications:    (feeding supplement) PROSource Plus liquid 30 mL, 30 mL, Oral, BID BM, Samtani, Jai-Gurmukh, MD, 30 mL at 06/11/21 0818   0.9 %  sodium chloride infusion, 250 mL, Intravenous, Continuous, Elsner, Henry, MD, Last Rate: 1 mL/hr at 06/05/21 2221, 250 mL at 06/05/21 2221   bisacodyl (DULCOLAX) suppository 10 mg, 10 mg, Rectal, Daily PRN, Kristeen Miss, MD   Chlorhexidine Gluconate Cloth 2 % PADS 6 each, 6 each, Topical, Daily, Elsner, Henry, MD   gabapentin (NEURONTIN) capsule 800 mg, 800 mg, Oral, QHS PRN, Kristeen Miss, MD, 800  mg at 06/11/21 0411   insulin aspart (novoLOG) injection 0-9 Units, 0-9 Units, Subcutaneous, TID WC, Kristeen Miss, MD, 1 Units at 06/11/21 0819   insulin glargine-yfgn (SEMGLEE) injection 8 Units, 8 Units, Subcutaneous, Daily, Kristeen Miss, MD, 8 Units at 06/11/21 0820   menthol-cetylpyridinium (CEPACOL) lozenge 3 mg, 1 lozenge, Oral, PRN **OR** phenol (CHLORASEPTIC) mouth spray 1 spray, 1 spray, Mouth/Throat, PRN, Kristeen Miss, MD   metFORMIN (GLUCOPHAGE-XR) 24 hr tablet 1,500 mg, 1,500 mg, Oral, Q breakfast, Verlon Au, Jai-Gurmukh, MD, 1,500 mg at 06/11/21 0817   methocarbamol (ROBAXIN) tablet 1,000 mg, 1,000 mg, Oral, TID, Nita Sells, MD, 1,000 mg at 06/11/21 0816   morphine (MS CONTIN) 12 hr tablet 45 mg, 45 mg, Oral, BID, Nita Sells, MD, 45 mg at 06/11/21 1003   multivitamin with minerals tablet 1 tablet, 1 tablet, Oral, Daily, Kristeen Miss, MD, 1 tablet at 06/11/21 0818   naloxone (NARCAN) injection 0.4 mg, 0.4 mg, Intravenous, PRN, Kristeen Miss, MD   ondansetron (ZOFRAN) tablet 4 mg, 4 mg, Oral, Q6H PRN **OR** ondansetron (ZOFRAN) injection 4 mg, 4 mg, Intravenous, Q6H PRN, Kristeen Miss, MD   oxyCODONE-acetaminophen (PERCOCET/ROXICET) 5-325 MG per tablet 1 tablet, 1 tablet, Oral, Q3H PRN, 1 tablet at 06/11/21 0818 **AND** oxyCODONE (Oxy IR/ROXICODONE) immediate release tablet 5 mg, 5 mg, Oral, Q3H PRN, Nita Sells, MD, 5 mg at 06/11/21 0818   polyethylene glycol (MIRALAX / GLYCOLAX) packet 17 g, 17 g, Oral, Daily PRN, Kristeen Miss, MD   protein supplement (ENSURE MAX) liquid, 11 oz, Oral, QHS, Kristeen Miss, MD, 11 oz at 06/10/21 2115   rifampin (RIFADIN) capsule 300 mg, 300 mg, Oral, Q12H, Rosiland Oz, MD, 300 mg at 06/11/21 0816   senna-docusate (Senokot-S) tablet 2 tablet, 2 tablet, Oral, Daily, Kristeen Miss, MD, 2 tablet at 06/09/21 0830   sodium chloride flush (NS) 0.9 % injection 10-40 mL, 10-40 mL, Intracatheter, Q12H, Samtani, Jai-Gurmukh, MD, 10 mL at 06/10/21 0853   sodium chloride flush (NS) 0.9 % injection 10-40 mL, 10-40 mL, Intracatheter, PRN, Verlon Au, Jai-Gurmukh, MD   sodium chloride flush (NS) 0.9 % injection 3 mL, 3 mL, Intravenous, Q12H, Kristeen Miss, MD, 3 mL at 06/11/21 0820   sodium chloride flush (NS) 0.9 % injection 3 mL, 3 mL, Intravenous, PRN, Kristeen Miss, MD   sodium phosphate (FLEET) 7-19 GM/118ML enema 1 enema, 1 enema, Rectal, Once PRN, Kristeen Miss, MD   vancomycin (VANCOREADY) IVPB 1250 mg/250 mL, 1,250 mg, Intravenous, Q12H, Willette Cluster, RPH, Last Rate: 166.7 mL/hr at 06/11/21 0407, 1,250 mg at 06/11/21 0407  Patients Current Diet:  Diet Order             Diet Carb Modified Fluid consistency: Thin; Room service appropriate? Yes  Diet effective  now                   Precautions / Restrictions Precautions Precautions: Fall, Back Precaution Booklet Issued: No Precaution Comments: Reviewed back precautions Restrictions Weight Bearing Restrictions: No   Has the patient had 2 or more falls or a fall with injury in the past year? Yes  Prior Activity Level Limited Community (1-2x/wk): independent prior to original surgery earlier this year, currently not able to drive, ambulates with RW  Prior Functional Level Self Care: Did the patient need help bathing, dressing, using the toilet or eating? Needed some help  Indoor Mobility: Did the patient need assistance with walking from room to room (with or without device)? Needed some help  Stairs: Did  the patient need assistance with internal or external stairs (with or without device)? Needed some help  Functional Cognition: Did the patient need help planning regular tasks such as shopping or remembering to take medications? Independent  Patient Information Are you of Hispanic, Latino/a,or Spanish origin?: A. No, not of Hispanic, Latino/a, or Spanish origin What is your race?: A. White Do you need or want an interpreter to communicate with a doctor or health care staff?: 0. No  Patient's Response To:  Health Literacy and Transportation Is the patient able to respond to health literacy and transportation needs?: Yes Health Literacy - How often do you need to have someone help you when you read instructions, pamphlets, or other written material from your doctor or pharmacy?: Sometimes In the past 12 months, has lack of transportation kept you from medical appointments or from getting medications?: No In the past 12 months, has lack of transportation kept you from meetings, work, or from getting things needed for daily living?: No  Development worker, international aid / [place] Devices/Equipment: Radio producer (specify quad or straight), Eyeglasses, Environmental consultant (specify type) Home  Equipment: Shower seat, Cane - single point, Walker - 4 wheels  Prior Device Use: Indicate devices/aids used by the patient prior to current illness, exacerbation or injury? Walker  Current Functional Level Cognition  Overall Cognitive Status: Within Functional Limits for tasks assessed Orientation Level: Oriented X4 General Comments: needs cues to refrain from bending forward once sitting upright; reports it feels better    Extremity Assessment (includes Sensation/Coordination)  Upper Extremity Assessment: Generalized weakness  Lower Extremity Assessment: Defer to PT evaluation LLE Deficits / Details: Grossly ~2-/5 hip flexion, 3/5 knee flexion, 2-/5 knee extension, 5/5 DF/PF LLE Sensation: decreased light touch LLE Coordination: decreased fine motor, decreased gross motor    ADLs  Overall ADL's : Needs assistance/impaired Eating/Feeding: Set up, Sitting Grooming: Set up, Sitting Upper Body Bathing: Supervision/ safety, Set up, Sitting Lower Body Bathing: Moderate assistance, Sitting/lateral leans, Sit to/from stand Upper Body Dressing : Supervision/safety, Set up Upper Body Dressing Details (indicate cue type and reason): Limited ROM due to prior shoudler sx Lower Body Dressing: Moderate assistance, Sitting/lateral leans Lower Body Dressing Details (indicate cue type and reason): Mod A for LB dressing due to limited ROM with pain Toilet Transfer: Moderate assistance, Stand-pivot, BSC, RW Toilet Transfer Details (indicate cue type and reason): Pt reports that he feels weak after having worked with PT earlier today, resulting in increased difficulty with stand-pivot transfer to [facility] from recliner. Functional mobility during ADLs: Min guard, Rolling walker General ADL Comments: Pt continues to be limited by pain, weakness, and activity tolerance.    Mobility  Overal bed mobility: Needs Assistance Bed Mobility: Rolling, Sidelying to Sit Rolling: Min assist Sidelying to sit: Mod  assist, HOB elevated Sit to sidelying: Min assist, HOB elevated General bed mobility comments: Assist to bring LLE to EOB and elevate trunk to get upright.    Transfers  Overall transfer level: Needs assistance Equipment used: Rolling walker (2 wheeled) Transfer via Lift Equipment: Stedy Transfers: Sit to/from Stand Sit to Stand: Mod assist, Min assist, From elevated surface General transfer comment: Assist to power to standing from EOB with Min A (elevated height), mod A from low chair/BSC. Difficulty placing    Ambulation / Gait / Stairs / Wheelchair Mobility  Ambulation/Gait Ambulation/Gait assistance: Min Web designer (Feet): 7 Feet (+ 4' x2) Assistive device: Rolling walker (2 wheeled) Gait Pattern/deviations: Step-through pattern, Decreased stride length, Trunk flexed, Narrow  base of support General Gait Details: Slow, unsteady gait with left knee instability during stance phase and difficulty advancing LLE due to weak hip flexors. Therapist supporting left knee to prevent buckling. ABle to side step along side bed in both directions with assist for balance. Short steps. Gait velocity: decreased Gait velocity interpretation: <1.31 ft/sec, indicative of household ambulator    Posture / Balance Dynamic Sitting Balance Sitting balance - Comments: Leaning forward on legs to stretch out back reporting comfort and ease of pain;  prefers to have BUE support Balance Overall balance assessment: Needs assistance Sitting-balance support: Feet supported, Bilateral upper extremity supported Sitting balance-[name]: Fair Sitting balance - Comments: Leaning forward on legs to stretch out back reporting comfort and ease of pain;  prefers to have BUE support Standing balance support: During functional activity Standing balance-[name]: Poor Standing balance comment: reliant on BUE support    Special needs/care consideration Skin surgical incision ; Continuous IV Ancef    Previous Home Environment (from acute therapy documentation) Living Arrangements: Spouse/significant other Available Help at Discharge: Family Type of Home: House Home Layout: One level Home Access: Level entry Bathroom Shower/Tub: Chiropodist: Handicapped height Bathroom Accessibility: Yes Home Care Services: No Additional Comments: pt unreliable historian and no family present. Pt reports prior to fall on 5/28 he was independent.  Discharge Living Setting Plans for Discharge Living Setting: Patient's home, Lives with (comment) (spouse) Type of Home at Discharge: House Discharge Home Layout: One level Discharge Home Access: Level entry Discharge Bathroom Shower/Tub: Tub/shower unit Discharge Bathroom Toilet: Handicapped height Discharge Bathroom Accessibility: Yes How Accessible: Accessible via walker Does the patient have any problems obtaining your medications?: No  Social/Family/Support Systems Patient Roles: Spouse Anticipated Caregiver: spouse, [name] Anticipated Ambulance person Information: [phone] Ability/Limitations of Caregiver: works from home, supervision to light min assist Caregiver Availability: 24/7 Discharge Plan Discussed with Primary Caregiver: Yes Is Caregiver In Agreement with Plan?: Yes Does Caregiver/Family have Issues with Lodging/Transportation while Pt is in Rehab?: No  Goals Patient/Family Goal for Rehab: PT/OT supervision w/c level, min assist ambulatory, SLP n/a Expected length of stay: 14-18 days Pt/Family Agrees to Admission and willing to participate: Yes Program Orientation Provided & Reviewed with Pt/Caregiver Including Roles  & Responsibilities: Yes  Decrease burden of Care through IP rehab admission: n/a  Possible need for SNF placement upon discharge: no  Patient Condition: I have reviewed medical records from [facility], spoken with CM, and patient and spouse. I met with patient at the bedside for  inpatient rehabilitation assessment.  Patient will benefit from ongoing PT and OT, can actively participate in 3 hours of therapy a day 5 days of the week, and can make measurable gains during the admission.  Patient will also benefit from the coordinated team approach during an Inpatient Acute Rehabilitation admission.  The patient will receive intensive therapy as well as Rehabilitation physician, nursing, social worker, and care management interventions.  Due to bladder management, bowel management, safety, skin/wound care, disease management, medication administration, pain management, and patient education the patient requires 24 hour a day rehabilitation nursing.  The patient is currently min to mod assist with mobility and basic ADLs.  Discharge setting and therapy post discharge at home with home health is anticipated.  Patient has agreed to participate in the Acute Inpatient Rehabilitation Program and will admit 06/11/21.  Preadmission Screen Completed By: Shann Medal, PT, DPT and  Genella Mech, 06/11/2021 12:00 PM ______________________________________________________________________   Discussed status with Dr.  Koraline Phillipson on 06/11/21 at 30 and received approval for admission today.  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 Yes Does the patient  require coordinated care of a physician, rehab nurse, PT, OT, and SLP to address physical and functional deficits in the context of the above medical diagnosis(es)? Yes Addressing deficits in the following areas: balance, endurance, locomotion, strength, transferring, bowel/bladder control, bathing, dressing, feeding, grooming, toileting, and psychosocial support Can the patient actively participate in an intensive therapy program of at least 3 hrs of therapy 5 days a week? Yes The potential for patient to make measurable gains while on inpatient rehab is good Anticipated functional outcomes upon discharge from inpatient rehab: supervision PT, supervision OT, n/a SLP Estimated rehab length of stay to reach the above functional goals is: 14-18d Anticipated discharge destination: Home 10. Overall Rehab/Functional Prognosis: good   MD Signature: Charlett Blake M.D. [employer] Fellow Am Acad of Phys Med and Rehab Diplomate Am Board of Electrodiagnostic Med Fellow Am Board of Interventional Pain

## 2021-06-08 DIAGNOSIS — M4620 Osteomyelitis of vertebra, site unspecified: Secondary | ICD-10-CM | POA: Diagnosis not present

## 2021-06-08 LAB — GLUCOSE, CAPILLARY
Glucose-Capillary: 134 mg/dL — ABNORMAL HIGH (ref 70–99)
Glucose-Capillary: 223 mg/dL — ABNORMAL HIGH (ref 70–99)
Glucose-Capillary: 106 mg/dL — ABNORMAL HIGH (ref 70–99)
Glucose-Capillary: 182 mg/dL — ABNORMAL HIGH (ref 70–99)

## 2021-06-08 LAB — SUSCEPTIBILITY, AER + ANAEROB: Source of Sample: 8680

## 2021-06-08 LAB — SUSCEPTIBILITY RESULT

## 2021-06-08 MED ORDER — RIFAMPIN 300 MG PO CAPS
300.0000 mg | ORAL_CAPSULE | Freq: Two times a day (BID) | ORAL | Status: DC
  Administered 2021-06-08 – 2021-06-11 (×7): 300 mg via ORAL
  Filled 2021-06-08 (×8): qty 1

## 2021-06-08 MED ORDER — OXYCODONE-ACETAMINOPHEN 5-325 MG PO TABS
1.5000 | ORAL_TABLET | ORAL | Status: DC | PRN
Start: 2021-06-08 — End: 2021-06-08
  Filled 2021-06-08: qty 2

## 2021-06-08 MED ORDER — VANCOMYCIN HCL 1500 MG/300ML IV SOLN
1500.0000 mg | Freq: Once | INTRAVENOUS | Status: AC
  Administered 2021-06-08: 1500 mg via INTRAVENOUS
  Filled 2021-06-08: qty 300

## 2021-06-08 MED ORDER — OXYCODONE-ACETAMINOPHEN 5-325 MG PO TABS
2.0000 | ORAL_TABLET | ORAL | Status: DC | PRN
Start: 2021-06-08 — End: 2021-06-09
  Administered 2021-06-08 – 2021-06-09 (×6): 2 via ORAL
  Filled 2021-06-08 (×7): qty 2

## 2021-06-08 MED ORDER — VANCOMYCIN HCL 1250 MG/250ML IV SOLN
1250.0000 mg | Freq: Two times a day (BID) | INTRAVENOUS | Status: DC
  Administered 2021-06-09 – 2021-06-11 (×5): 1250 mg via INTRAVENOUS
  Filled 2021-06-08 (×6): qty 250

## 2021-06-08 MED FILL — Morphine Sulfate Inj 4 MG/ML: INTRAMUSCULAR | Qty: 1 | Status: AC

## 2021-06-08 NOTE — TOC Progression Note (Signed)
Transition of Care [facility]) - Progression Note    Patient Details  Name: John Ware MRN: [id number] Date of Birth: November 04, 1965  Transition of Care [facility]) CM/SW Contact  Epifanio Lesches, RN Phone Number: 06/08/2021, 9:40 AM  Clinical Narrative:    Transition plan:CIR, insurance authorization pending. TOC team will continue to monitor and assist with needs...   Expected Discharge Plan: IP Rehab Facility Barriers to Discharge: Continued Medical Work up  Expected Discharge Plan and Services Expected Discharge Plan: IP Rehab Facility   Discharge Planning Services: CM Consult                     DME Arranged: Other see comment (IV ABX therapy / [facility]) DME Agency: Other - Comment Date DME Agency Contacted: 05/30/21 Time DME Agency Contacted: 1329 Representative spoke with at DME Agency: [facility] Arranged: RN           Social Determinants of Health (SDOH) Interventions    Readmission Risk Interventions Readmission Risk Prevention Plan 02/16/2021  Transportation Screening Complete  PCP or Specialist Appt within 3-5 Days Complete  HRI or Home Care Consult Complete  Social Work Consult for Recovery Care Planning/Counseling Complete  Palliative Care Screening Complete  Medication Review Oceanographer) Complete  Some recent data might be hidden

## 2021-06-08 NOTE — Progress Notes (Signed)
Occupational Therapy Treatment Patient Details Name: John Ware MRN: [id number] DOB: 1966-03-22 Today's Date: 06/08/2021   History of present illness Patient is a 55 y/o male who presents on 05/28/21 with LLE weakness and pain. 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 S/p lumbar laminotomies and decompression L1-2 and L2-3 on the left decompression of the common dural tube and nerve roots of L1 and L2 and L3 on 06/05/21. PMH includes HTN, chronic pain, DM, COVID, MSSA bacteremia, depression.   OT comments  Pt is slowly progressing towards OT goals. Pt reported that he had taken a few steps with PT earlier and was glad that he had been able to do so. However, pt stated that felt weak after ambulating earlier and found it difficult to complete toilet transfers to [facility], requiring extended time for rest. Pt continues to be appropriate for CIR upon d/c. Will continue to follow acutely.    Recommendations for follow up therapy are one component of a multi-disciplinary discharge planning process, led by the attending physician.  Recommendations may be updated based on patient status, additional functional criteria and insurance authorization.    Follow Up Recommendations  CIR;Supervision/Assistance - 24 hour    Equipment Recommendations  3 in 1 bedside commode    Recommendations for Other Services      Precautions / Restrictions Precautions Precautions: Fall;Back Restrictions Weight Bearing Restrictions: No       Mobility Bed Mobility                    Transfers                      Balance Overall balance assessment: Needs assistance Sitting-balance support: Bilateral upper extremity supported;Feet supported Sitting balance-[name]: Good      Standing balance support: During functional activity Standing balance-[name]: Poor Standing balance comment: reliant on BUE support                           ADL either performed or assessed with clinical judgement   ADL Overall ADL's : Needs assistance/impaired                         Toilet Transfer: Moderate assistance;Stand-pivot;BSC;RW Toilet Transfer Details (indicate cue type and reason): Pt reports that he feels weak after having worked with PT earlier today, resulting in increased difficulty with stand-pivot transfer to [facility] from recliner.           General ADL Comments: Pt continues to be limited by pain, weakness, and activity tolerance.     Vision       Perception     Praxis      Cognition Arousal/Alertness: Awake/alert Behavior During Therapy: WFL for tasks assessed/performed Overall Cognitive Status: Within Functional Limits for tasks assessed                                          Exercises     Shoulder Instructions       General Comments      Pertinent Vitals/ Pain       Pain Assessment: 0-10  Pain Score: 7  Pain Location: back, hips Pain Descriptors / Indicators: Constant;Discomfort;Sore;Guarding Pain Intervention(s): Limited activity within patient's tolerance;Monitored during session  Home Living                                          Prior Functioning/Environment              Frequency  Min 2X/week        Progress Toward Goals  OT Goals(current goals can now be found in the care plan section)  Progress towards OT goals: Progressing toward goals  Acute Rehab OT Goals Patient Stated Goal: Reduce pain OT Goal Formulation: With patient Time For Goal Achievement: 06/20/21 Potential to Achieve Goals: Good ADL Goals Pt Will Perform Grooming: with modified independence;standing Pt Will Perform Lower Body Dressing: with modified independence;sit to/from stand Pt  Will Transfer to Toilet: with modified independence;ambulating;bedside commode Pt Will Perform Toileting - Clothing Manipulation and hygiene: with modified independence;sitting/lateral leans;sit to/from stand  Plan Discharge plan remains appropriate;Frequency remains appropriate    Co-evaluation                 AM-PAC OT "6 Clicks" Daily Activity     Outcome Measure   Help from another person eating meals?: None Help from another person taking care of personal grooming?: A Little Help from another person toileting, which includes using toliet, bedpan, or urinal?: A Lot Help from another person bathing (including washing, rinsing, drying)?: A Lot Help from another person to put on and taking off regular upper body clothing?: A Little Help from another person to put on and taking off regular lower body clothing?: A Lot 6 Click Score: 16    End of Session Equipment Utilized During Treatment: Rolling walker;Gait belt  OT Visit Diagnosis: Unsteadiness on feet (R26.81);Other abnormalities of gait and mobility (R26.89);Muscle weakness (generalized) (M62.81);Pain Pain - Right/Left: Left Pain - part of body: Hip   Activity Tolerance Patient limited by fatigue;Patient limited by pain   Patient Left in chair;with call bell/phone within reach   Nurse Communication Mobility status        Time: 1151-1209 OT Time Calculation (min): 18 min  Charges: OT General Charges $OT Visit: 1 Visit OT Treatments $Self Care/Home Management : 8-22 mins  Aanvi Voyles C, OT/L  Acute Rehab [phone]  Lenice Llamas 06/08/2021, 12:27 PM

## 2021-06-08 NOTE — Progress Notes (Signed)
PHARMACY CONSULT NOTE FOR:  OUTPATIENT  PARENTERAL ANTIBIOTIC THERAPY (OPAT)  Indication: Lumbar Wound Infection/Bacteremia Regimen: Cefazolin 2g IV q8h Rifampin 300 mg PO BID End date: 07/17/21  IV antibiotic discharge orders are pended. To discharging provider:  please sign these orders via discharge navigator,  Select New Orders & click on the button choice - Manage This Unsigned Work.     Thank you for allowing pharmacy to be a part of this patient's care.  Shirlee More, PharmD PGY2 Infectious Diseases Pharmacy Resident   Please check [email or web address] for unit-specific pharmacy phone numbers

## 2021-06-08 NOTE — Progress Notes (Signed)
Physical Therapy Treatment Patient Details Name: John Ware MRN: [id number] DOB: February 20, 1966 Today's Date: 06/08/2021   History of Present Illness Patient is a 55 y/o male who presents on 05/28/21 with LLE weakness and pain. 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 S/p lumbar laminotomies and decompression L1-2 and L2-3 on the left decompression of the common dural tube and nerve roots of L1 and L2 and L3 on 06/05/21. PMH includes HTN, chronic pain, DM, COVID, MSSA bacteremia, depression.    PT Comments    Patient progressing slowly towards PT goals. Initiated gait training today with Min A of 2 and use of RW. Noted to have left knee instability and partial knee buckling during stance phase and difficulty advancing LLE during swing phase. Fatigues quickly and mainly limited by pain. Feels more comfortable having 2 person assist for ambulation. Reviewed back precautions as pt only able to recall 2 and needs reinforcements during functional mobility. Continues to be appropriate for CIR. Will follow.    Recommendations for follow up therapy are one component of a multi-disciplinary discharge planning process, led by the attending physician.  Recommendations may be updated based on patient status, additional functional criteria and insurance authorization.  Follow Up Recommendations  CIR;Supervision for mobility/OOB     Equipment Recommendations  Rolling walker with 5" wheels;Wheelchair (measurements PT);Wheelchair cushion (measurements PT) (might be able to borrow a w/c)    Recommendations for Other Services       Precautions / Restrictions Precautions Precautions: Fall;Back Precaution Booklet Issued: No Precaution Comments: Reviewed back precautions Restrictions Weight  Bearing Restrictions: No     Mobility  Bed Mobility Overal bed mobility: Needs Assistance Bed Mobility: Rolling;Sidelying to Sit Rolling: Min assist Sidelying to sit: HOB elevated;Mod assist       General bed mobility comments: Pt wanting assist to pull himself up and forward to EOB, some assist with LEs, still needing to manually mobilize LLE with UEs; cues to reach for rail.    Transfers Overall transfer level: Needs assistance Equipment used: Rolling walker (2 wheeled) Transfers: Sit to/from Stand Sit to Stand: Min assist;From elevated surface         General transfer comment: Assist to power up from EOB with cues for hand placement; stood from EOB x3, transferred to chair post ambulation.  Ambulation/Gait Ambulation/Gait assistance: Min assist Gait Distance (Feet): 7 Feet (+ 4' x2) Assistive device: Rolling walker (2 wheeled) Gait Pattern/deviations: Step-through pattern;Decreased stride length;Trunk flexed;Narrow base of support Gait velocity: decreased Gait velocity interpretation: <1.31 ft/sec, indicative of household ambulator General Gait Details: Slow, unsteady gait with left knee instability during stance phase and difficulty advancing LLE due to weak hip flexors. Therapist supporting left knee to prevent buckling. ABle to side step along side bed in both directions with assist for balance. Short steps.   Stairs             Wheelchair Mobility    Modified Rankin (Stroke Patients Only)       Balance Overall balance assessment: Needs assistance Sitting-balance support: Feet supported;Bilateral upper extremity supported Sitting balance-[name]: Fair Sitting balance - Comments: Leaning forward on legs to stretch out back reporting comfort and ease of pain;  prefers to have BUE support   Standing balance support: During functional activity Standing balance-[name]: Poor Standing  balance comment: reliant on BUE support                             Cognition Arousal/Alertness: Awake/alert Behavior During Therapy: WFL for tasks assessed/performed Overall Cognitive Status: Within Functional Limits for tasks assessed                                 General Comments: decreased recall of precautions- needed repeated cues throughout session to functionally maintain, loves to bend forward when sitting up      Exercises      General Comments        Pertinent Vitals/Pain Pain Assessment: Faces Pain Score: 7  Faces Pain Scale: Hurts whole lot Pain Location: back, hips Pain Descriptors / Indicators: Constant;Discomfort;Sore;Guarding Pain Intervention(s): Monitored during session;Repositioned;Premedicated before session;Limited activity within patient's tolerance    Home Living                      Prior Function            PT Goals (current goals can now be found in the care plan section) Acute Rehab PT Goals Patient Stated Goal: Reduce pain Progress towards PT goals: Progressing toward goals (slowly)    Frequency    Min 3X/week      PT Plan Current plan remains appropriate    Co-evaluation              AM-PAC PT "6 Clicks" Mobility   Outcome Measure  Help needed turning from your back to your side while in a flat bed without using bedrails?: A Little Help needed moving from lying on your back to sitting on the side of a flat bed without using bedrails?: A Lot Help needed moving to and from a bed to a chair (including a wheelchair)?: A Little Help needed standing up from a chair using your arms (e.g., wheelchair or bedside chair)?: A Little Help needed to walk in hospital room?: A Little Help needed climbing 3-5 steps with a railing? : Total 6 Click Score: 15    End of Session Equipment Utilized During Treatment: Gait belt Activity Tolerance: Patient tolerated treatment well;Patient limited by pain Patient left: in chair;with call bell/phone within reach;with chair  alarm set Nurse Communication: Mobility status PT Visit Diagnosis: Pain;Muscle weakness (generalized) (M62.81) Pain - Right/Left: Left Pain - part of body:  (back/hips)     Time: 6440-3474 PT Time Calculation (min) (ACUTE ONLY): 23 min  Charges:  $Gait Training: 8-22 mins $Therapeutic Activity: 8-22 mins                     Vale Haven, PT, DPT Acute Rehabilitation Services Pager [phone] Office [phone]      John Ware 06/08/2021, 1:00 PM

## 2021-06-08 NOTE — Progress Notes (Signed)
Pharmacy Antibiotic Note  John Ware is a 55 y.o. male admitted on 05/28/2021 with bacteremia secondary to lumbar infection.  Pharmacy has been consulted for vancomycin dosing.  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 S/P Laminectomy and epidural debridement 10/11.   Staph aureus from 10/3 blood cx is identified as MRSA now. He had disseminated MSSA infection in the past and has received prolonged anti MSSA tx with PO suppression.  Plan: Start Vancomycin 1500 mg IV x1 dose, followed by 1250 mg IV q12h (eAUC470, Scr 0.8) Goal AUC 400-550 Monitor renal function, micro data, and clinical status Vanc levels as indicated  Height: 5\' 10"  (177.8 cm) Weight: 77.1 kg (170 lb) IBW/kg (Calculated) : 73  Temp (24hrs), Avg:98.9 F (37.2 C), Min:98.5 F (36.9 C), Max:99.4 F (37.4 C)  Recent Labs  Lab 06/04/21 0519 06/05/21 0259 06/06/21 0057 06/07/21 0330  WBC 9.2 10.9* 9.2 9.6  CREATININE 0.57* 0.51* 0.53* 0.55*    Estimated Creatinine Clearance: 110.3 mL/min (A) (by C-G formula based on SCr of 0.55 mg/dL (L)).    Allergies  Allergen Reactions   Tizanidine Other (See Comments)    Leg cramps    Antimicrobials this admission: Cefazolin  10/3 >> 10/14 Vancomycin 10/14 >>   Dose adjustments this admission: None  Microbiology results: 10/3 BCx: MRSA now, previously MSS 10/11 Lumbar cx: pending  Thank you for allowing pharmacy to be a part of this patient's care.  11/14, PharmD PGY2 Infectious Diseases Pharmacy Resident   Please check [email or web address] for unit-specific pharmacy phone numbers

## 2021-06-08 NOTE — Progress Notes (Signed)
PROGRESS NOTE   John Ware  WGN:[id number] DOB: Jan 01, 1966 DOA: 05/28/2021 PCP: Junie Spencer, FNP  Brief Narrative:  55 year old white male known history of chronic pain with prior history of lumbar spondylosis stenosis + L3, L4, L5 laminectomy with decompression by 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 undergoing multiple procedures at that time-see discharge summary 03/15/2021 Patient was discharged on IV antibiotic with cefepime and PICC line and was to transition to oral Keflex Patient developed recurrent low back pain associated with LLE weakness MRI lumbar spine showed osteomyelitis and epidural phlegmon at posterior endplate of L1 Patient was transferred to [facility] based course  L1-L3 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 06/05/2021 Defer to infectious disease and neurosurgery continue cefazolin till 07/17/21- cultures from OR 10/11 NGTD Rifaximin added back 10/14 PICC placed 10/13 Therapy recommending SNF/CIR--- await further work-up from social work and TOC Rpt MRI per [name] given plateaus improvement Chronic pain Cont MS Contin 30 twice daily-IV Dilaudid stopped 10/14 Continue Oxy IR/Percocet increased to 1.5 tab every 3 as needed breakthrough on 10/14 Saline locked on 10/12 DM TY 2 with neuropathy-and hyperglycemia A1c 8.4 CBGs improved from 130-230 Continue Semglee 8 units daily, sliding scale coverage Resume gabapentin 800 nightly as needed Will resume metformin 1500 every morning  Jardiance has been held at patient preference Hypotension in setting of  hypertension Not on current meds  DVT prophylaxis: SCD Code Status: Presumed full Family Communication:  Disposition:  Status is: Inpatient  Remains inpatient appropriate because:Hemodynamically unstable, Ongoing diagnostic testing needed not appropriate for outpatient work up, and Unsafe d/c plan  Dispo:  Patient From:  Home  Planned Disposition:  Inpatient Rehab  Medically stable for discharge:  No          Consultants:  Neurosurgery Infectious disease  Procedures:   Laminotomy 06/05/2021 [name]  Antimicrobials:   Cefazolin   Subjective:  more ambulatory--walked 4 steps today--sitting out of bed in chair Pain still ~ 7.  We discussed stopping IV pain meds and he is ok with this  Objective: Vitals:   06/07/21 1952 06/07/21 2331 06/08/21 0415 06/08/21 0745  BP: (!) 117/97 122/85 112/73 116/81  Pulse: 74 72 66 71  Resp: 16 18 17 16   Temp: 99.4 F (37.4 C) 98.9 F (37.2 C) 98.5 F (36.9 C) 98.7 F (37.1 C)  TempSrc: Oral Oral Oral Oral  SpO2: 96% 94% 96% 98%  Weight:      Height:        Intake/Output Summary (Last 24 hours) at 06/08/2021 1308 Last data filed at 06/08/2021 0805 Gross per 24 hour  Intake 569.7 ml  Output 2201 ml  Net -1631.3 ml    Filed Weights   05/29/21 1756 06/05/21 1431  Weight: 77.1 kg 77.1 kg    Examination:  No changes  coherent no distress EOMI NCAT no focal deficit CTA B no rales no rhonchi Abdomen soft no rebound no guarding ROM intact but significant weakness left lower extremity Euthymic congruent   Data Reviewed: personally reviewed   CBC    Component Value Date/Time   WBC 9.6 06/07/2021 0330   RBC 4.12 (L) 06/07/2021 0330   HGB 10.9 (  L) 06/07/2021 0330   HGB 11.3 (L) 04/23/2021 1239   HCT 33.1 (L) 06/07/2021 0330   HCT 34.1 (L) 04/23/2021 1239   PLT 496 (H) 06/07/2021 0330   PLT 765 (H) 04/23/2021 1239   MCV 80.3 06/07/2021 0330   MCV 80 04/23/2021 1239   MCH 26.5 06/07/2021 0330   MCHC  32.9 06/07/2021 0330   RDW 15.8 (H) 06/07/2021 0330   RDW 14.5 04/23/2021 1239   LYMPHSABS 2.6 05/28/2021 1353   LYMPHSABS 1.7 04/23/2021 1239   MONOABS 0.8 05/28/2021 1353   EOSABS 0.0 05/28/2021 1353   EOSABS 0.1 04/23/2021 1239   BASOSABS 0.0 05/28/2021 1353   BASOSABS 0.0 04/23/2021 1239   CMP Latest Ref Rng & Units 06/07/2021 06/06/2021 06/05/2021  Glucose 70 - 99 mg/dL 440(H) 474(Q) 595(G)  BUN 6 - 20 mg/dL 13 12 15   Creatinine 0.61 - 1.24 mg/dL ) 3.87(F) 6.43(P)  Sodium 135 - 145 mmol/L 131(L) 130(L) 130(L)  Potassium 3.5 - 5.1 mmol/L 3.8 4.7 4.4  Chloride 98 - 111 mmol/L 95(L) 93(L) 94(L)  CO2 22 - 32 mmol/L 26 28 29   Calcium 8.9 - 10.3 mg/dL 2.95(J) 9.0 9.2  Total Protein 6.5 - 8.1 g/dL - - -  Total Bilirubin 0.3 - 1.2 mg/dL - - -  Alkaline Phos 38 - 126 U/L - - -  AST 15 - 41 U/L - - -  ALT 0 - 44 U/L - - -     Radiology Studies: EKG SITE RITE  Result Date: 06/06/2021 If Site Rite image not attached, placement could not be confirmed due to current cardiac rhythm.    Scheduled Meds:  (feeding supplement) PROSource Plus  30 mL Oral BID BM   ALPRAZolam  0.25 mg Oral Once   Chlorhexidine Gluconate Cloth  6 each Topical Q0600   insulin aspart  0-9 Units Subcutaneous TID WC   insulin glargine-yfgn  8 Units Subcutaneous Daily   metFORMIN  1,500 mg Oral Q breakfast   morphine  30 mg Oral BID   multivitamin with minerals  1 tablet Oral Daily   mupirocin ointment  1 application Nasal BID   Ensure Max Protein  11 oz Oral QHS   rifampin  300 mg Oral Q12H   senna-docusate  2 tablet Oral Daily   sodium chloride flush  10-40 mL Intracatheter Q12H   sodium chloride flush  3 mL Intravenous Q12H   Continuous Infusions:  sodium chloride 250 mL (06/05/21 2221)    ceFAZolin (ANCEF) IV 2 g (06/08/21 0952)     LOS: 11 days   Time spent: 25  2222, MD [facility] To contact the attending provider between 7A-7P or the covering provider  during after hours 7P-7A, please log into the web site [email or web address] and access using universal [place] password for that web site. If you do not have the password, please call the hospital operator.  06/08/2021, 1:08 PM

## 2021-06-08 NOTE — Progress Notes (Signed)
Patient ID: John Ware, male   DOB: 1966-02-03, 55 y.o.   MRN: [id number] No changes clinically I will follow-up with patient on Monday hopefully MRI will be completed by then

## 2021-06-08 NOTE — Progress Notes (Addendum)
I have seen and examined the patient. I have personally reviewed the clinical findings, laboratory findings, microbiological data and imaging studies. The assessment and treatment plan was discussed with the Nurse Practitioner Rexene Alberts. I agree with her/his recommendations except following additions/corrections.  Remains afebrile, no leukocytosis  No changed on Lower extremity exam.  He is able to lift his rt leg above the bed but unable to lift his left leg.  His plantar flexion in left foot is 3/5 and rt foot is 4/5.   10/11 OR cultures no growth in 3 days Staph aureus from 10/3 blood cx is identified as MRSA now. 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 [place], [place] [address] Phone: [phone]  Fax: [phone]    [facility]  Date of Admission:  05/28/2021      Total days of antibiotics 1  Cefazolin 10/03 >> c  Rifampin 10/12 >> c         ASSESSMENT: John Ware is a 55 y.o. 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 S/P Laminectomy and epidural debridement 10/11.   MSSA Bacteremia - no growth from repeated cultures, final from 10/6. PICC in place for home OPAT.  Lumbar Osteomyelitis  L1-2, L2-3 with new epidural phlegmon - POD3 laminectomy with extensive edematous yellow ligamentous material removed from epidural space at L1-2 and degraded disc material. Sensitive to rifampin - continue 300 mg BID.  Still with unchanged weakness to LLE and today reported RLE to feel weak also. Dr. 12/11 following and ordered repeated L-spine MRI, which has not been done yet.   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 Mild TV regurgitation remains. No new changes to other valve structures or function. No need to pursue TEE.   Medication Monitoring =  Weekly CBC and CMP    PLAN: Will change cefazolin to vancomycin given MRSA in blood cx 05/28/21 Restart Rifampin       Principal Problem:   Infection of spine (HCC) Active Problems:   Depression, recurrent (HCC)   Diabetes mellitus (HCC)   Diabetic polyneuropathy associated with type 2 diabetes mellitus (HCC)   HTN (hypertension)   Endocarditis   Psoas abscess (HCC)    (feeding supplement) PROSource Plus  30 mL Oral BID BM   ALPRAZolam  0.25 mg Oral Once   Chlorhexidine Gluconate Cloth  6 each Topical Q0600   insulin aspart  0-9 Units Subcutaneous TID WC   insulin glargine-yfgn  8 Units Subcutaneous Daily   metFORMIN  1,500 mg Oral Q breakfast   morphine  30 mg Oral BID   multivitamin with minerals  1 tablet Oral Daily   mupirocin ointment  1 application Nasal BID  Ensure Max Protein  11 oz Oral QHS   rifampin  300 mg Oral Q12H   senna-docusate  2 tablet Oral Daily   sodium chloride flush  10-40 mL Intracatheter Q12H   sodium chloride flush  3 mL Intravenous Q12H    SUBJECTIVE: Back pain OK - concerned that there has been no improvement in LLE weakness and feels the right is more weak as well. No fevers/chills. No bowel/bladder dysfunction or decreased sensation to any extremities.    Review of Systems: Review of Systems  Constitutional:  Negative for chills  and fever.  HENT:  Negative for tinnitus.   Eyes:  Negative for blurred vision and photophobia.  Respiratory:  Negative for cough and sputum production.   Cardiovascular:  Negative for chest pain.  Gastrointestinal:  Negative for diarrhea, nausea and vomiting.  Genitourinary:  Negative for dysuria.  Musculoskeletal:  Positive for back pain.  Skin:  Negative for rash.  Neurological:  Positive for focal weakness. Negative for headaches.    Allergies  Allergen Reactions   Tizanidine Other (See Comments)    Leg cramps    OBJECTIVE: Vitals:   06/07/21 1952 06/07/21 2331 06/08/21 0415 06/08/21 0745  BP: (!) 117/97 122/85 112/73 116/81  Pulse: 74 72 66 71  Resp: 16 18 17 16   Temp: 99.4 F (37.4 C) 98.9 F (37.2 C) 98.5 F (36.9 C) 98.7 F (37.1 C)  TempSrc: Oral Oral Oral Oral  SpO2: 96% 94% 96% 98%  Weight:      Height:       Body mass index is 24.39 kg/m.  Physical Exam Vitals reviewed.  Constitutional:      Appearance: Normal appearance. He is well-developed. He is not ill-appearing.     Comments: Resting comfortably in bed.   HENT:     Head: Normocephalic.     Mouth/Throat:     Mouth: Mucous membranes are moist.     Dentition: Normal dentition. No dental abscesses.     Pharynx: Oropharynx is clear.  Eyes:     General: No scleral icterus. Cardiovascular:     Rate and Rhythm: Normal rate and regular rhythm.     Heart sounds: Normal heart sounds.  Pulmonary:     Effort: Pulmonary effort is normal.     Breath sounds: Normal breath sounds.  Abdominal:     General: There is no distension.     Palpations: Abdomen is soft.     Tenderness: There is no abdominal tenderness.  Musculoskeletal:        General: Normal range of motion.     Cervical back: Normal range of motion.  Lymphadenopathy:     Cervical: No cervical adenopathy.  Skin:    General: Skin is warm and dry.     Coloration: Skin is not jaundiced or pale.     Findings: No rash.  Neurological:      Mental Status: He is alert and oriented to person, place, and time.  Psychiatric:        Mood and Affect: Mood normal.        Judgment: Judgment normal.    Lab Results Lab Results  Component Value Date   WBC 9.6 06/07/2021   HGB 10.9 (L) 06/07/2021   HCT 33.1 (L) 06/07/2021   MCV 80.3 06/07/2021   PLT 496 (H) 06/07/2021    Lab Results  Component Value Date   CREATININE 0.55 (L) 06/07/2021   BUN 13 06/07/2021   NA 131 (L) 06/07/2021   K  3.8 06/07/2021   CL 95 (L) 06/07/2021   CO2 26 06/07/2021    Lab Results  Component Value Date   ALT 11 06/01/2021   AST 18 06/01/2021   ALKPHOS 106 06/01/2021   BILITOT 0.6 06/01/2021     Microbiology: Recent Results (from the past 240 hour(s))  Culture, blood (routine x 2)     Status: None   Collection Time: 06/01/21  6:02 AM   Specimen: BLOOD RIGHT HAND  Result Value Ref Range Status   Specimen Description BLOOD RIGHT HAND  Final   Special Requests   Final    AEROBIC BOTTLE ONLY Blood Culture results may not be optimal due to an inadequate volume of blood received in culture bottles   Culture   Final    NO GROWTH 5 DAYS Performed at [facility], [address] [place], [place] [address]    Report Status 06/06/2021 FINAL  Final  Culture, blood (routine x 2)     Status: None   Collection Time: 06/01/21  6:02 AM   Specimen: BLOOD LEFT HAND  Result Value Ref Range Status   Specimen Description BLOOD LEFT HAND  Final   Special Requests   Final    AEROBIC BOTTLE ONLY Blood Culture results may not be optimal due to an inadequate volume of blood received in culture bottles   Culture   Final    NO GROWTH 5 DAYS Performed at [facility], [address] [place], [place] [address]    Report Status 06/06/2021 FINAL  Final  Surgical pcr screen     Status: Abnormal   Collection Time: 06/05/21 11:17 AM   Specimen: Nasal Mucosa; Nasal Swab  Result Value Ref Range Status   MRSA, PCR NEGATIVE NEGATIVE Final    Staphylococcus aureus POSITIVE (A) NEGATIVE Final    Comment: (NOTE) The Xpert SA Assay (FDA approved for NASAL specimens in patients 27 years of age and older), is one component of a comprehensive surveillance program. It is not intended to diagnose infection nor to guide or monitor treatment. Performed at [facility], [address] [place], [place] [address]   Aerobic/Anaerobic Culture w Gram Stain (surgical/deep wound)     Status: None (Preliminary result)   Collection Time: 06/05/21  6:11 PM   Specimen: Soft Tissue, Other  Result Value Ref Range Status   Specimen Description WOUND LUMBAR  Final   Special Requests LUMBAR DISC PT ON ANCEF  Final   Gram Stain   Final    FEW SQUAMOUS EPITHELIAL CELLS PRESENT MODERATE WBC SEEN NO ORGANISMS SEEN    Culture   Final    NO GROWTH 2 DAYS NO ANAEROBES ISOLATED; CULTURE IN PROGRESS FOR 5 DAYS Performed at [facility], [address] [place], [place] [address]    Report Status PENDING  Incomplete    Rexene Alberts, MSN, NP-C [facility] Pager: [phone]  06/08/2021  9:36 AM

## 2021-06-09 ENCOUNTER — Inpatient Hospital Stay (HOSPITAL_COMMUNITY): Payer: Commercial Managed Care - PPO

## 2021-06-09 LAB — GLUCOSE, CAPILLARY
Glucose-Capillary: 147 mg/dL — ABNORMAL HIGH (ref 70–99)
Glucose-Capillary: 133 mg/dL — ABNORMAL HIGH (ref 70–99)
Glucose-Capillary: 169 mg/dL — ABNORMAL HIGH (ref 70–99)

## 2021-06-09 LAB — MIC RESULT

## 2021-06-09 LAB — MINIMUM INHIBITORY CONC. (1 DRUG)

## 2021-06-09 MED ORDER — OXYCODONE HCL 5 MG PO TABS
10.0000 mg | ORAL_TABLET | ORAL | Status: DC | PRN
  Administered 2021-06-09 – 2021-06-10 (×7): 10 mg via ORAL
  Filled 2021-06-09 (×7): qty 2

## 2021-06-09 MED ORDER — GADOBUTROL 1 MMOL/ML IV SOLN
7.5000 mL | Freq: Once | INTRAVENOUS | Status: AC | PRN
  Administered 2021-06-09: 7.5 mL via INTRAVENOUS

## 2021-06-09 NOTE — Plan of Care (Signed)

## 2021-06-09 NOTE — Progress Notes (Signed)
PROGRESS NOTE   John Ware  GDJ:[id number] DOB: 07-16-1966 DOA: 05/28/2021 PCP: Junie Spencer, FNP  Brief Narrative:  55 year old white male known history of chronic pain with prior history of lumbar spondylosis stenosis + L3, L4, L5 laminectomy with decompression by 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 undergoing multiple procedures at that time-see discharge summary 03/15/2021 Patient was discharged on IV antibiotic with cefepime and PICC line and was to transition to oral Keflex Patient developed recurrent low back pain associated with LLE weakness MRI lumbar spine showed osteomyelitis and epidural phlegmon at posterior endplate of L1 Patient was transferred to [facility] based course  L1-L3 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 06/05/2021 Per infectious disease and neurosurgery continue cefazolin till 07/17/21- cultures from OR 10/11 NGTD Rifaximin added back 10/14 PICC placed 10/13 Therapy recommending SNF/CIR--- await further work-up from social work and TOC Rpt MRI per [name] given plateau improvement--Still pending at this time Chronic pain Cont MS Contin 30 twice daily-IV Dilaudid stopped 10/14 Continue Oxy IR at current dose 10 q3 prn--pain under better control Saline locked on 10/12 DM TY 2 with neuropathy-and hyperglycemia A1c 8.4 CBGs improved from 133-147 Continue Semglee 8 units daily, sliding scale coverage Resume gabapentin 800 nightly as needed Will resume metformin 1500 every morning  Jardiance has been held at patient preference Hypotension in setting of  hypertension Not on current meds  DVT prophylaxis: SCD Code Status: Presumed full Family Communication:  Disposition:  Status is: Inpatient  Remains inpatient appropriate because:Hemodynamically unstable, Ongoing diagnostic testing needed not appropriate for outpatient work up, and Unsafe d/c plan  Dispo: Patient From:  Home Planned Disposition:  Inpatient Rehab edically stable for discharge:  No    Consultants:  Neurosurgery Infectious disease  Procedures:   Laminotomy 06/05/2021 [name]  Antimicrobials:   Cefazolin   Subjective:  Pain better No fever no chills Tol diet About to go for MRI Walking some steps in room  Objective: Vitals:   06/08/21 0745 06/08/21 1505 06/08/21 2000 06/09/21 0700  BP: 116/81 120/78 108/72 110/82  Pulse: 71 78 66 61  Resp: 16 16  19   Temp: 98.7 F (37.1 C) 98.5 F (36.9 C) 98.4 F (36.9 C) 98.7 F (37.1 C)  TempSrc: Oral Oral Oral Oral  SpO2: 98% 100% 97% 98%  Weight:      Height:        Intake/Output Summary (Last 24 hours) at 06/09/2021 1427 Last data filed at 06/09/2021 1100 Gross per 24 hour  Intake 369.13 ml  Output 3200 ml  Net -2830.87 ml    Filed Weights   05/29/21 1756 06/05/21 1431  Weight: 77.1 kg 77.1 kg    Examination:   no distress EOMI NCAT no focal deficit CTA B no wheeze rales rhonchi Abdomen soft no rebound no guarding ROM intact Weakenss in LE seems some improved  Data Reviewed: personally reviewed   CBC    Component Value Date/Time   WBC 9.6 06/07/2021 0330   RBC 4.12 (L) 06/07/2021 0330   HGB 10.9 (L) 06/07/2021 0330   HGB 11.3 (L) 04/23/2021 1239   HCT 33.1 (L) 06/07/2021 0330   HCT 34.1 (L) 04/23/2021 1239  PLT 496 (H) 06/07/2021 0330   PLT 765 (H) 04/23/2021 1239   MCV 80.3 06/07/2021 0330   MCV 80 04/23/2021 1239   MCH 26.5 06/07/2021 0330   MCHC 32.9 06/07/2021 0330   RDW 15.8 (H) 06/07/2021 0330   RDW 14.5 04/23/2021 1239   LYMPHSABS 2.6 05/28/2021 1353    LYMPHSABS 1.7 04/23/2021 1239   MONOABS 0.8 05/28/2021 1353   EOSABS 0.0 05/28/2021 1353   EOSABS 0.1 04/23/2021 1239   BASOSABS 0.0 05/28/2021 1353   BASOSABS 0.0 04/23/2021 1239   CMP Latest Ref Rng & Units 06/07/2021 06/06/2021 06/05/2021  Glucose 70 - 99 mg/dL 161(W) 960(A) 540(J)  BUN 6 - 20 mg/dL 13 12 15   Creatinine 0.61 - 1.24 mg/dL ) 8.11(B) 1.47(W)  Sodium 135 - 145 mmol/L 131(L) 130(L) 130(L)  Potassium 3.5 - 5.1 mmol/L 3.8 4.7 4.4  Chloride 98 - 111 mmol/L 95(L) 93(L) 94(L)  CO2 22 - 32 mmol/L 26 28 29   Calcium 8.9 - 10.3 mg/dL 2.95(A) 9.0 9.2  Total Protein 6.5 - 8.1 g/dL - - -  Total Bilirubin 0.3 - 1.2 mg/dL - - -  Alkaline Phos 38 - 126 U/L - - -  AST 15 - 41 U/L - - -  ALT 0 - 44 U/L - - -     Radiology Studies: No results found.   Scheduled Meds:  (feeding supplement) PROSource Plus  30 mL Oral BID BM   Chlorhexidine Gluconate Cloth  6 each Topical Q0600   insulin aspart  0-9 Units Subcutaneous TID WC   insulin glargine-yfgn  8 Units Subcutaneous Daily   metFORMIN  1,500 mg Oral Q breakfast   morphine  30 mg Oral BID   multivitamin with minerals  1 tablet Oral Daily   mupirocin ointment  1 application Nasal BID   Ensure Max Protein  11 oz Oral QHS   rifampin  300 mg Oral Q12H   senna-docusate  2 tablet Oral Daily   sodium chloride flush  10-40 mL Intracatheter Q12H   sodium chloride flush  3 mL Intravenous Q12H   Continuous Infusions:  sodium chloride 250 mL (06/05/21 2221)   vancomycin 166.7 mL/hr at 06/09/21 0500     LOS: 12 days   Time spent: 15  2222, MD [facility] To contact the attending provider between 7A-7P or the covering provider during after hours 7P-7A, please log into the web site [email or web address] and access using universal [place] password for that web site. If you do not have the password, please call the hospital operator.  06/09/2021, 2:27 PM

## 2021-06-10 LAB — AEROBIC/ANAEROBIC CULTURE W GRAM STAIN (SURGICAL/DEEP WOUND): Culture: NO GROWTH

## 2021-06-10 LAB — GLUCOSE, CAPILLARY
Glucose-Capillary: 141 mg/dL — ABNORMAL HIGH (ref 70–99)
Glucose-Capillary: 99 mg/dL (ref 70–99)
Glucose-Capillary: 124 mg/dL — ABNORMAL HIGH (ref 70–99)
Glucose-Capillary: 95 mg/dL (ref 70–99)

## 2021-06-10 MED ORDER — METHOCARBAMOL 500 MG PO TABS
1000.0000 mg | ORAL_TABLET | Freq: Three times a day (TID) | ORAL | Status: DC
  Administered 2021-06-10 – 2021-06-11 (×3): 1000 mg via ORAL
  Filled 2021-06-10 (×3): qty 2

## 2021-06-10 MED ORDER — OXYCODONE-ACETAMINOPHEN 5-325 MG PO TABS
1.0000 | ORAL_TABLET | ORAL | Status: DC | PRN
Start: 2021-06-10 — End: 2021-06-11
  Administered 2021-06-10 – 2021-06-11 (×7): 1 via ORAL
  Filled 2021-06-10 (×7): qty 1

## 2021-06-10 MED ORDER — OXYCODONE HCL 5 MG PO TABS
5.0000 mg | ORAL_TABLET | ORAL | Status: DC | PRN
Start: 2021-06-10 — End: 2021-06-11
  Administered 2021-06-10 – 2021-06-11 (×7): 5 mg via ORAL
  Filled 2021-06-10 (×7): qty 1

## 2021-06-10 NOTE — Progress Notes (Signed)
ID Brief Note  Afebrile, WBC 9.6 No updates on OR cultures  MRI L findings noted  Plan  Continue  Vancomycin, pharmacy to dose Fu OR cultures  Fu Neurosurgery recs regarding new MRI L spine  Monitor CBC, BMP and Vancomycin trough  Odette Fraction, MD Infectious Disease Physician [facility] [address] [place], [place] [address] Phone: [phone]  Fax: [phone]

## 2021-06-10 NOTE — Plan of Care (Signed)

## 2021-06-10 NOTE — Plan of Care (Signed)

## 2021-06-10 NOTE — Progress Notes (Signed)
PROGRESS NOTE   John Ware  BJS:[id number] DOB: 1965-11-11 DOA: 05/28/2021 PCP: Junie Spencer, FNP  Brief Narrative:  55 year old white male known history of chronic pain with prior history of lumbar spondylosis stenosis + L3, L4, L5 laminectomy with decompression by 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 Sheran Fava 10/11 but recovery has stalled and has pain with ambulation-rpt MRI done ID has seen-PICC has been placed Await new insights form Neurosurgery   Hospital-Problem based course  L1-L3 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 06/05/2021 Per infectious disease and neurosurgery continue cefazolin till 07/17/21- cultures from OR 10/11 NGTD Rifaximin added back 10/14 PICC placed 10/13 Therapy recommending SNF/CIR--- await further work-up from social work and TOC Rpt MRI 10/15 shows no overt changes--defer planning to Neurosurgery Chronic pain Cont MS Contin 30 twice daily-IV Dilaudid stopped 10/14 Continue Oxy IR /percocet at current dose 10 q3 prn Increasing robaxin to 1000 tid for spasm Saline locked on 10/12 DM TY 2 with neuropathy-and hyperglycemia A1c 8.4 CBGs improved from 99-150 Continue Semglee 8  units daily, sliding scale coverage Resume gabapentin 800 nightly as needed Will resume metformin 1500 every morning  Jardiance has been held at patient preference Hypotension in setting of hypertension Not on current meds  DVT prophylaxis: SCD Code Status: Presumed full Family Communication:  Disposition:  Status is: Inpatient  Remains inpatient appropriate because:Hemodynamically unstable, Ongoing diagnostic testing needed not appropriate for outpatient work up, and Unsafe d/c plan  Dispo: Patient From:  Home Planned Disposition:  Inpatient Rehab edically stable for discharge:  No    Consultants:  Neurosurgery Infectious disease  Procedures:   Laminotomy 06/05/2021 [name]  Antimicrobials:   Cefazolin, Rifampin added   Subjective:  Some spasm-asking for Increase muscle relaxant No c/p Fever  Objective: Vitals:   06/08/21 2000 06/09/21 0700 06/09/21 1933 06/10/21 0700  BP: 108/72 110/82 116/80 104/85  Pulse: 66 61 84 68  Resp:  19 18   Temp: 98.4 F (36.9 C) 98.7 F (37.1 C) 99.2 F (37.3 C) 98.7 F (37.1 C)  TempSrc: Oral Oral Oral Oral  SpO2: 97% 98% 99% 97%  Weight:      Height:        Intake/Output Summary (Last 24 hours) at 06/10/2021 1527 Last data filed at 06/10/2021 1506 Gross per 24 hour  Intake 169.46 ml  Output 1290 ml  Net -1120.54 ml    Filed Weights   05/29/21 1756 06/05/21 1431  Weight: 77.1 kg 77.1 kg    Examination:   no distress EOMI NCAT no focal deficit CTA B no added sounds rales rhonchi Abdomen soft no rebound no guarding non distended ROM intact Weakenss in LE about the same  Data Reviewed: personally reviewed   CBC    Component Value Date/Time   WBC 9.6 06/07/2021 0330   RBC 4.12 (L) 06/07/2021 0330  HGB 10.9 (L) 06/07/2021 0330   HGB 11.3 (L) 04/23/2021 1239   HCT 33.1 (L) 06/07/2021 0330   HCT 34.1 (L) 04/23/2021 1239   PLT 496 (H) 06/07/2021 0330   PLT 765 (H) 04/23/2021 1239   MCV 80.3  06/07/2021 0330   MCV 80 04/23/2021 1239   MCH 26.5 06/07/2021 0330   MCHC 32.9 06/07/2021 0330   RDW 15.8 (H) 06/07/2021 0330   RDW 14.5 04/23/2021 1239   LYMPHSABS 2.6 05/28/2021 1353   LYMPHSABS 1.7 04/23/2021 1239   MONOABS 0.8 05/28/2021 1353   EOSABS 0.0 05/28/2021 1353   EOSABS 0.1 04/23/2021 1239   BASOSABS 0.0 05/28/2021 1353   BASOSABS 0.0 04/23/2021 1239   CMP Latest Ref Rng & Units 06/07/2021 06/06/2021 06/05/2021  Glucose 70 - 99 mg/dL 387(F) 643(P) 295(J)  BUN 6 - 20 mg/dL 13 12 15   Creatinine 0.61 - 1.24 mg/dL ) 8.84(Z) 6.60(Y)  Sodium 135 - 145 mmol/L 131(L) 130(L) 130(L)  Potassium 3.5 - 5.1 mmol/L 3.8 4.7 4.4  Chloride 98 - 111 mmol/L 95(L) 93(L) 94(L)  CO2 22 - 32 mmol/L 26 28 29   Calcium 8.9 - 10.3 mg/dL 3.01(S) 9.0 9.2  Total Protein 6.5 - 8.1 g/dL - - -  Total Bilirubin 0.3 - 1.2 mg/dL - - -  Alkaline Phos 38 - 126 U/L - - -  AST 15 - 41 U/L - - -  ALT 0 - 44 U/L - - -     Radiology Studies: MR Lumbar Spine W Wo Contrast  Result Date: 06/09/2021 CLINICAL DATA:  Discitis osteomyelitis with epidural abscess. Interval lumbar decompression at L1-2 and L2-3 on 06/05/2021 EXAM: MRI LUMBAR SPINE WITHOUT AND WITH CONTRAST TECHNIQUE: Multiplanar and multiecho pulse sequences of the lumbar spine were obtained without and with intravenous contrast. CONTRAST:  7.58mL GADAVIST GADOBUTROL 1 MMOL/ML IV SOLN COMPARISON:  MRI 05/28/2021 FINDINGS: Segmentation:  Standard. Alignment:  Unchanged grade 1 anterolisthesis of L5 on S1. Vertebrae: Postsurgical changes of L3-S1 posterior and interbody fusion. Ongoing changes of discitis osteomyelitis at L1-2 and L2-3. Low T1 bone marrow signal throughout the L1, L2, and L3 vertebral bodies with associated bone marrow edema and enhancement are compatible with acute osteomyelitis. No significant interval progression in the degree of endplate destruction compared to previous MRI. Enhancing ventral epidural collection at the L1 and L2  levels is similar in appearance to the previous MRI and measures up to 5 mm in thickness. No evidence of progressive epidural fluid collection. Interval decompressive laminotomies on the left at L1-2 and L2-3. Conus medullaris and cauda equina: Conus extends to the L1 level. Conus and cauda equina appear normal. Paraspinal and other soft tissues: Extensive T2 hyperintense signal throughout the posterior paraspinal musculature and within the bilateral psoas muscles. Elongated fluid collection within the right psoas muscle does not appear increased in size compared to the prior study. Small collection within the left psoas muscle appears similar to slightly decreased in size compared to the previous study. Similar degree of prevertebral inflammatory changes without well-defined fluid collection. Stable size and appearance of presumed postoperative seroma posterior to the L4 and L5 vertebral bodies. Disc levels: T12-L1: Right paracentral disc protrusion. No high-grade stenosis. Unchanged. L1-L2: Interval posterior decompression. Moderate residual canal stenosis at this level, improved from prior. Persistent high-grade bilateral foraminal stenosis. L2-L3: Interval posterior decompression. Mild-to-moderate residual canal stenosis, improved from prior. Persistent high-grade bilateral foraminal stenosis. L3-L4: Prior fusion and posterior decompression. No evidence of residual stenosis. Unchanged. L4-L5: Prior fusion and  posterior decompression. No evidence of residual stenosis. Unchanged. L5-S1: Anterolisthesis. Prior fusion and posterior decompression. No residual canal stenosis. Similar degree of bilateral foraminal stenosis. Unchanged. IMPRESSION: 1. Ongoing changes of discitis-osteomyelitis at L1-2 and L2-3. No significant interval progression in the degree of endplate destruction compared to previous MRI. 2. Enhancing ventral epidural collection at the L1 and L2 levels is similar in appearance to the previous MRI and  measures up to 5 mm in thickness. No evidence of progressive epidural fluid collection. 3. Interval posterior decompression at L1-2 and L2-3 with mild-to-moderate residual canal stenosis at L2-3 and moderate residual canal stenosis at L1-2, both improved from prior. 4. 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 Electronically Signed   By: Duanne Guess D.O.   On: 06/09/2021 15:06     Scheduled Meds:  (feeding supplement) PROSource Plus  30 mL Oral BID BM   Chlorhexidine Gluconate Cloth  6 each Topical Q0600   insulin aspart  0-9 Units Subcutaneous TID WC   insulin glargine-yfgn  8 Units Subcutaneous Daily   metFORMIN  1,500 mg Oral Q breakfast   methocarbamol  1,000 mg Oral TID   morphine  30 mg Oral BID   multivitamin with minerals  1 tablet Oral Daily   Ensure Max Protein  11 oz Oral QHS   rifampin  300 mg Oral Q12H   senna-docusate  2 tablet Oral Daily   sodium chloride flush  10-40 mL Intracatheter Q12H   sodium chloride flush  3 mL Intravenous Q12H   Continuous Infusions:  sodium chloride 250 mL (06/05/21 2221)   vancomycin 1,250 mg (06/10/21 0428)     LOS: 13 days   Time spent: 15  Rhetta Mura, MD [facility] To contact the attending provider between 7A-7P or the covering provider during after hours 7P-7A, please log into the web site [email or web address] and access using universal [place] password for that web site. If you do not have the password, please call the hospital operator.  06/10/2021, 3:27 PM

## 2021-06-11 ENCOUNTER — Encounter (HOSPITAL_COMMUNITY): Payer: Self-pay | Admitting: Internal Medicine

## 2021-06-11 ENCOUNTER — Encounter (HOSPITAL_COMMUNITY): Payer: Self-pay | Admitting: Physical Medicine and Rehabilitation

## 2021-06-11 ENCOUNTER — Other Ambulatory Visit: Payer: Self-pay

## 2021-06-11 ENCOUNTER — Inpatient Hospital Stay (HOSPITAL_COMMUNITY)
Admission: RE | Admit: 2021-06-11 | Discharge: 2021-06-26 | DRG: 552 | Disposition: A | Discharge status: Home Health | Payer: Commercial Managed Care - PPO | Source: Intra-hospital | Attending: Physical Medicine and Rehabilitation | Admitting: Physical Medicine and Rehabilitation

## 2021-06-11 DIAGNOSIS — Z96611 Presence of right artificial shoulder joint: Secondary | ICD-10-CM | POA: Diagnosis present

## 2021-06-11 DIAGNOSIS — M4646 Discitis, unspecified, lumbar region: Secondary | ICD-10-CM | POA: Diagnosis present

## 2021-06-11 DIAGNOSIS — I10 Essential (primary) hypertension: Secondary | ICD-10-CM | POA: Diagnosis present

## 2021-06-11 DIAGNOSIS — A4902 Methicillin resistant Staphylococcus aureus infection, unspecified site: Secondary | ICD-10-CM | POA: Diagnosis not present

## 2021-06-11 DIAGNOSIS — E1142 Type 2 diabetes mellitus with diabetic polyneuropathy: Secondary | ICD-10-CM | POA: Diagnosis present

## 2021-06-11 DIAGNOSIS — Z888 Allergy status to other drugs, medicaments and biological substances status: Secondary | ICD-10-CM

## 2021-06-11 DIAGNOSIS — M62838 Other muscle spasm: Secondary | ICD-10-CM | POA: Diagnosis not present

## 2021-06-11 DIAGNOSIS — Z792 Long term (current) use of antibiotics: Secondary | ICD-10-CM

## 2021-06-11 DIAGNOSIS — E871 Hypo-osmolality and hyponatremia: Secondary | ICD-10-CM | POA: Diagnosis present

## 2021-06-11 DIAGNOSIS — Z87891 Personal history of nicotine dependence: Secondary | ICD-10-CM | POA: Diagnosis not present

## 2021-06-11 DIAGNOSIS — R29898 Other symptoms and signs involving the musculoskeletal system: Secondary | ICD-10-CM

## 2021-06-11 DIAGNOSIS — L89311 Pressure ulcer of right buttock, stage 1: Secondary | ICD-10-CM | POA: Diagnosis present

## 2021-06-11 DIAGNOSIS — B9562 Methicillin resistant Staphylococcus aureus infection as the cause of diseases classified elsewhere: Secondary | ICD-10-CM | POA: Diagnosis present

## 2021-06-11 DIAGNOSIS — M48061 Spinal stenosis, lumbar region without neurogenic claudication: Secondary | ICD-10-CM

## 2021-06-11 DIAGNOSIS — F339 Major depressive disorder, recurrent, unspecified: Secondary | ICD-10-CM | POA: Diagnosis present

## 2021-06-11 DIAGNOSIS — Z79899 Other long term (current) drug therapy: Secondary | ICD-10-CM | POA: Diagnosis not present

## 2021-06-11 DIAGNOSIS — I959 Hypotension, unspecified: Secondary | ICD-10-CM | POA: Diagnosis present

## 2021-06-11 DIAGNOSIS — L89321 Pressure ulcer of left buttock, stage 1: Secondary | ICD-10-CM | POA: Diagnosis present

## 2021-06-11 DIAGNOSIS — M549 Dorsalgia, unspecified: Secondary | ICD-10-CM | POA: Diagnosis not present

## 2021-06-11 DIAGNOSIS — D62 Acute posthemorrhagic anemia: Secondary | ICD-10-CM | POA: Diagnosis present

## 2021-06-11 DIAGNOSIS — M48062 Spinal stenosis, lumbar region with neurogenic claudication: Secondary | ICD-10-CM

## 2021-06-11 DIAGNOSIS — R7881 Bacteremia: Secondary | ICD-10-CM | POA: Diagnosis present

## 2021-06-11 DIAGNOSIS — E1169 Type 2 diabetes mellitus with other specified complication: Secondary | ICD-10-CM | POA: Diagnosis present

## 2021-06-11 DIAGNOSIS — Z7984 Long term (current) use of oral hypoglycemic drugs: Secondary | ICD-10-CM | POA: Diagnosis not present

## 2021-06-11 DIAGNOSIS — M4626 Osteomyelitis of vertebra, lumbar region: Secondary | ICD-10-CM | POA: Diagnosis present

## 2021-06-11 DIAGNOSIS — G8918 Other acute postprocedural pain: Secondary | ICD-10-CM | POA: Diagnosis present

## 2021-06-11 DIAGNOSIS — G834 Cauda equina syndrome: Principal | ICD-10-CM | POA: Diagnosis present

## 2021-06-11 DIAGNOSIS — Z79891 Long term (current) use of opiate analgesic: Secondary | ICD-10-CM

## 2021-06-11 DIAGNOSIS — M5416 Radiculopathy, lumbar region: Secondary | ICD-10-CM | POA: Diagnosis present

## 2021-06-11 DIAGNOSIS — M5137 Other intervertebral disc degeneration, lumbosacral region: Secondary | ICD-10-CM

## 2021-06-11 LAB — COMPREHENSIVE METABOLIC PANEL
ALT: 10 U/L (ref 0–44)
AST: 12 U/L — ABNORMAL LOW (ref 15–41)
Albumin: 2.5 g/dL — ABNORMAL LOW (ref 3.5–5.0)
Alkaline Phosphatase: 92 U/L (ref 38–126)
Anion gap: 8 (ref 5–15)
BUN: 8 mg/dL (ref 6–20)
CO2: 27 mmol/L (ref 22–32)
Calcium: 9 mg/dL (ref 8.9–10.3)
Chloride: 98 mmol/L (ref 98–111)
Creatinine, Ser: 0.43 mg/dL — ABNORMAL LOW (ref 0.61–1.24)
GFR, Estimated: >60 mL/min (ref >60)
Glucose, Bld: 123 mg/dL — ABNORMAL HIGH (ref 70–99)
Potassium: 4 mmol/L (ref 3.5–5.1)
Sodium: 133 mmol/L — ABNORMAL LOW (ref 135–145)
Total Bilirubin: 0.6 mg/dL (ref 0.3–1.2)
Total Protein: 6.9 g/dL (ref 6.5–8.1)

## 2021-06-11 LAB — CBC WITH DIFFERENTIAL/PLATELET
Abs Immature Granulocytes: 0.05 10*3/uL (ref 0.00–0.07)
Basophils Absolute: 0.1 10*3/uL (ref 0.0–0.1)
Basophils Relative: 1 %
Eosinophils Absolute: 0.2 10*3/uL (ref 0.0–0.5)
Eosinophils Relative: 2 %
HCT: 35.6 % — ABNORMAL LOW (ref 39.0–52.0)
Hemoglobin: 11.3 g/dL — ABNORMAL LOW (ref 13.0–17.0)
Immature Granulocytes: 0 %
Lymphocytes Relative: 21 %
Lymphs Abs: 2.6 10*3/uL (ref 0.7–4.0)
MCH: 26.1 pg (ref 26.0–34.0)
MCHC: 31.7 g/dL (ref 30.0–36.0)
MCV: 82.2 fL (ref 80.0–100.0)
Monocytes Absolute: 1 10*3/uL (ref 0.1–1.0)
Monocytes Relative: 8 %
Neutro Abs: 8.7 10*3/uL — ABNORMAL HIGH (ref 1.7–7.7)
Neutrophils Relative %: 68 %
Platelets: 573 10*3/uL — ABNORMAL HIGH (ref 150–400)
RBC: 4.33 MIL/uL (ref 4.22–5.81)
RDW: 15.7 % — ABNORMAL HIGH (ref 11.5–15.5)
WBC: 12.7 10*3/uL — ABNORMAL HIGH (ref 4.0–10.5)
nRBC: 0 % (ref 0.0–0.2)

## 2021-06-11 LAB — GLUCOSE, CAPILLARY
Glucose-Capillary: 121 mg/dL — ABNORMAL HIGH (ref 70–99)
Glucose-Capillary: 205 mg/dL — ABNORMAL HIGH (ref 70–99)
Glucose-Capillary: 111 mg/dL — ABNORMAL HIGH (ref 70–99)
Glucose-Capillary: 170 mg/dL — ABNORMAL HIGH (ref 70–99)

## 2021-06-11 LAB — VANCOMYCIN, PEAK: Vancomycin Pk: 38 ug/mL (ref 30–40)

## 2021-06-11 MED ORDER — ALUM & MAG HYDROXIDE-SIMETH 200-200-20 MG/5ML PO SUSP: 30.0000 mL | ORAL | Status: DC | PRN

## 2021-06-11 MED ORDER — MORPHINE SULFATE ER 15 MG PO TBCR
45.0000 mg | EXTENDED_RELEASE_TABLET | Freq: Two times a day (BID) | ORAL | Status: DC
  Administered 2021-06-11: 45 mg via ORAL
  Filled 2021-06-11 (×2): qty 3

## 2021-06-11 MED ORDER — GUAIFENESIN-DM 100-10 MG/5ML PO SYRP: 5.0000 mL | ORAL_SOLUTION | Freq: Four times a day (QID) | ORAL | Status: DC | PRN

## 2021-06-11 MED ORDER — PROCHLORPERAZINE EDISYLATE 10 MG/2ML IJ SOLN
5.0000 mg | Freq: Four times a day (QID) | INTRAMUSCULAR | Status: DC | PRN
  Administered 2021-06-25: 10 mg via INTRAMUSCULAR
  Filled 2021-06-11: qty 2

## 2021-06-11 MED ORDER — RIFAMPIN 300 MG PO CAPS: 300.0000 mg | ORAL_CAPSULE | Freq: Two times a day (BID) | ORAL | Status: DC

## 2021-06-11 MED ORDER — ACETAMINOPHEN 325 MG PO TABS
325.0000 mg | ORAL_TABLET | ORAL | Status: DC | PRN
  Administered 2021-06-12: 650 mg via ORAL
  Filled 2021-06-11 (×2): qty 2

## 2021-06-11 MED ORDER — POLYETHYLENE GLYCOL 3350 17 G PO PACK: 17.0000 g | PACK | Freq: Every day | ORAL | Status: DC | PRN

## 2021-06-11 MED ORDER — SENNOSIDES-DOCUSATE SODIUM 8.6-50 MG PO TABS
2.0000 | ORAL_TABLET | Freq: Every day | ORAL | Status: DC
  Administered 2021-06-12 – 2021-06-21 (×3): 2 via ORAL
  Filled 2021-06-11 (×12): qty 2

## 2021-06-11 MED ORDER — ZINC SULFATE 220 (50 ZN) MG PO CAPS
220.0000 mg | ORAL_CAPSULE | Freq: Every day | ORAL | Status: DC
  Administered 2021-06-11 – 2021-06-26 (×16): 220 mg via ORAL
  Filled 2021-06-11 (×16): qty 1

## 2021-06-11 MED ORDER — TRAZODONE HCL 50 MG PO TABS
25.0000 mg | ORAL_TABLET | Freq: Every evening | ORAL | Status: DC | PRN
  Administered 2021-06-12 – 2021-06-22 (×4): 50 mg via ORAL
  Filled 2021-06-11 (×4): qty 1

## 2021-06-11 MED ORDER — MORPHINE SULFATE 15 MG PO TABS
15.0000 mg | ORAL_TABLET | Freq: Once | ORAL | Status: AC
Start: 2021-06-11 — End: 2021-06-11
  Administered 2021-06-11: 15 mg via ORAL
  Filled 2021-06-11: qty 1

## 2021-06-11 MED ORDER — METFORMIN HCL ER 750 MG PO TB24
1500.0000 mg | ORAL_TABLET | Freq: Every day | ORAL | Status: DC
  Administered 2021-06-12 – 2021-06-26 (×15): 1500 mg via ORAL
  Filled 2021-06-11 (×15): qty 2

## 2021-06-11 MED ORDER — ENSURE MAX PROTEIN PO LIQD
11.0000 [oz_av] | Freq: Every day | ORAL | Status: DC
  Administered 2021-06-11 – 2021-06-23 (×12): 11 [oz_av] via ORAL

## 2021-06-11 MED ORDER — GABAPENTIN 400 MG PO CAPS
800.0000 mg | ORAL_CAPSULE | Freq: Every day | ORAL | Status: DC
  Administered 2021-06-12 – 2021-06-25 (×14): 800 mg via ORAL
  Filled 2021-06-11 (×14): qty 2

## 2021-06-11 MED ORDER — CHLORHEXIDINE GLUCONATE CLOTH 2 % EX PADS
6.0000 | MEDICATED_PAD | Freq: Every day | CUTANEOUS | Status: DC
  Administered 2021-06-11: 6 via TOPICAL

## 2021-06-11 MED ORDER — PROCHLORPERAZINE MALEATE 5 MG PO TABS
5.0000 mg | ORAL_TABLET | Freq: Four times a day (QID) | ORAL | Status: DC | PRN
  Administered 2021-06-24: 5 mg via ORAL
  Filled 2021-06-11: qty 1

## 2021-06-11 MED ORDER — INSULIN GLARGINE-YFGN 100 UNIT/ML ~~LOC~~ SOPN: 8.0000 [IU] | PEN_INJECTOR | Freq: Every day | SUBCUTANEOUS | Status: DC

## 2021-06-11 MED ORDER — BISACODYL 10 MG RE SUPP
10.0000 mg | Freq: Every day | RECTAL | Status: DC | PRN
Start: 2021-06-11 — End: 2021-06-26

## 2021-06-11 MED ORDER — INSULIN ASPART 100 UNIT/ML IJ SOLN
0.0000 [IU] | Freq: Three times a day (TID) | INTRAMUSCULAR | Status: DC
  Administered 2021-06-12: 3 [IU] via SUBCUTANEOUS
  Administered 2021-06-12 – 2021-06-17 (×8): 1 [IU] via SUBCUTANEOUS
  Administered 2021-06-17: 2 [IU] via SUBCUTANEOUS
  Administered 2021-06-18: 1 [IU] via SUBCUTANEOUS
  Administered 2021-06-18 – 2021-06-19 (×2): 2 [IU] via SUBCUTANEOUS
  Administered 2021-06-19 – 2021-06-22 (×5): 1 [IU] via SUBCUTANEOUS
  Administered 2021-06-22: 2 [IU] via SUBCUTANEOUS
  Administered 2021-06-23 – 2021-06-24 (×2): 1 [IU] via SUBCUTANEOUS

## 2021-06-11 MED ORDER — SENNOSIDES-DOCUSATE SODIUM 8.6-50 MG PO TABS: 2.0000 | ORAL_TABLET | Freq: Every day | ORAL | Status: DC

## 2021-06-11 MED ORDER — METHOCARBAMOL 500 MG PO TABS
1000.0000 mg | ORAL_TABLET | Freq: Three times a day (TID) | ORAL | Status: DC
  Administered 2021-06-11 – 2021-06-26 (×44): 1000 mg via ORAL
  Filled 2021-06-11 (×44): qty 2

## 2021-06-11 MED ORDER — PROCHLORPERAZINE 25 MG RE SUPP: 12.5000 mg | Freq: Four times a day (QID) | RECTAL | Status: DC | PRN

## 2021-06-11 MED ORDER — NALOXONE HCL 0.4 MG/ML IJ SOLN: 0.4000 mg | INTRAMUSCULAR | Status: DC | PRN

## 2021-06-11 MED ORDER — ADULT MULTIVITAMIN W/MINERALS CH
1.0000 | ORAL_TABLET | Freq: Every day | ORAL | Status: DC
  Administered 2021-06-12 – 2021-06-26 (×15): 1 via ORAL
  Filled 2021-06-11 (×15): qty 1

## 2021-06-11 MED ORDER — FLEET ENEMA 7-19 GM/118ML RE ENEM: 1.0000 | ENEMA | Freq: Once | RECTAL | Status: DC | PRN

## 2021-06-11 MED ORDER — RIFAMPIN 300 MG PO CAPS
300.0000 mg | ORAL_CAPSULE | Freq: Two times a day (BID) | ORAL | Status: DC
  Administered 2021-06-11 – 2021-06-26 (×30): 300 mg via ORAL
  Filled 2021-06-11 (×31): qty 1

## 2021-06-11 MED ORDER — MORPHINE SULFATE ER 15 MG PO TBCR
45.0000 mg | EXTENDED_RELEASE_TABLET | Freq: Two times a day (BID) | ORAL | Status: DC
  Administered 2021-06-11 – 2021-06-12 (×2): 45 mg via ORAL
  Filled 2021-06-11 (×2): qty 3

## 2021-06-11 MED ORDER — VANCOMYCIN HCL 1250 MG/250ML IV SOLN
1250.0000 mg | Freq: Two times a day (BID) | INTRAVENOUS | Status: DC
  Administered 2021-06-11 – 2021-06-12 (×2): 1250 mg via INTRAVENOUS
  Filled 2021-06-11 (×2): qty 250

## 2021-06-11 MED ORDER — RIFAMPIN 300 MG PO CAPS: 300.0000 mg | ORAL_CAPSULE | Freq: Two times a day (BID) | ORAL | 0 refills | Status: DC

## 2021-06-11 MED ORDER — INSULIN GLARGINE-YFGN 100 UNIT/ML ~~LOC~~ SOLN
8.0000 [IU] | Freq: Every day | SUBCUTANEOUS | Status: DC
  Administered 2021-06-12 – 2021-06-26 (×15): 8 [IU] via SUBCUTANEOUS
  Filled 2021-06-11 (×15): qty 0.08

## 2021-06-11 MED ORDER — INSULIN ASPART 100 UNIT/ML IJ SOLN: 0.0000 [IU] | Freq: Three times a day (TID) | INTRAMUSCULAR | Status: DC

## 2021-06-11 MED ORDER — OXYCODONE HCL 5 MG PO TABS
5.0000 mg | ORAL_TABLET | ORAL | Status: DC | PRN
  Administered 2021-06-11 – 2021-06-12 (×5): 5 mg via ORAL
  Filled 2021-06-11 (×6): qty 1

## 2021-06-11 MED ORDER — SODIUM CHLORIDE 0.9% FLUSH
10.0000 mL | INTRAVENOUS | Status: DC | PRN
  Administered 2021-06-12 – 2021-06-25 (×5): 10 mL

## 2021-06-11 MED ORDER — GABAPENTIN 100 MG PO CAPS
100.0000 mg | ORAL_CAPSULE | Freq: Two times a day (BID) | ORAL | Status: DC
  Administered 2021-06-12 – 2021-06-26 (×29): 100 mg via ORAL
  Filled 2021-06-11 (×29): qty 1

## 2021-06-11 MED ORDER — OXYCODONE-ACETAMINOPHEN 5-325 MG PO TABS
1.0000 | ORAL_TABLET | ORAL | Status: DC | PRN
  Administered 2021-06-11 – 2021-06-12 (×5): 1 via ORAL
  Filled 2021-06-11 (×5): qty 1

## 2021-06-11 MED ORDER — DIPHENHYDRAMINE HCL 12.5 MG/5ML PO ELIX
12.5000 mg | ORAL_SOLUTION | Freq: Four times a day (QID) | ORAL | Status: DC | PRN
  Administered 2021-06-20 – 2021-06-24 (×6): 25 mg via ORAL
  Filled 2021-06-11 (×6): qty 10

## 2021-06-11 MED ORDER — POLYETHYLENE GLYCOL 3350 17 G PO PACK: 17.0000 g | PACK | Freq: Every day | ORAL | 0 refills | Status: DC | PRN

## 2021-06-11 MED ORDER — SODIUM CHLORIDE 0.9% FLUSH
10.0000 mL | Freq: Two times a day (BID) | INTRAVENOUS | Status: DC
  Administered 2021-06-11 – 2021-06-13 (×5): 10 mL

## 2021-06-11 MED ORDER — INSULIN ASPART 100 UNIT/ML IJ SOLN
0.0000 [IU] | Freq: Every day | INTRAMUSCULAR | Status: DC
Start: 2021-06-11 — End: 2021-06-26

## 2021-06-11 MED ORDER — MENTHOL 3 MG MT LOZG
1.0000 | LOZENGE | OROMUCOSAL | Status: DC | PRN
Start: 2021-06-11 — End: 2021-06-26

## 2021-06-11 MED ORDER — PHENOL 1.4 % MT LIQD
1.0000 | OROMUCOSAL | Status: DC | PRN
  Filled 2021-06-11: qty 177

## 2021-06-11 MED ORDER — ASCORBIC ACID 500 MG PO TABS
500.0000 mg | ORAL_TABLET | Freq: Two times a day (BID) | ORAL | Status: DC
  Administered 2021-06-11 – 2021-06-26 (×30): 500 mg via ORAL
  Filled 2021-06-11 (×30): qty 1

## 2021-06-11 MED ORDER — CEFAZOLIN IV (FOR PTA / DISCHARGE USE ONLY): 2.0000 g | Freq: Three times a day (TID) | INTRAVENOUS | 0 refills | Status: DC

## 2021-06-11 NOTE — Progress Notes (Signed)
Pt. Was confused and believed dressing needed to be changed.  I educated him and his wife that the dressing looked great, lines had been flushed, and he was not due for a change until 10/20.

## 2021-06-11 NOTE — Progress Notes (Signed)
Attempted to give report, but were not ready to receive the patient yet.

## 2021-06-11 NOTE — Progress Notes (Signed)
Inpatient Rehabilitation Admission Medication Review by a Pharmacist  A complete drug regimen review was completed for this patient to identify any potential clinically significant medication issues.  High Risk Drug Classes Is patient taking? Indication by Medication  Antipsychotic No   Anticoagulant No   Antibiotic Yes Vancomycin, rifampin -bacteremia secondary to lumbar infection  Opioid Yes MS Contin, oxycodone prn  -both for pain  Antiplatelet No   Hypoglycemics/insulin Yes Metformin, Semglee, novolog -DM  Vasoactive Medication No   Chemotherapy No   Other No      Type of Medication Issue Identified Description of Issue Recommendation(s)  Drug Interaction(s) (clinically significant)     Duplicate Therapy     Allergy     No Medication Administration End Date     Incorrect Dose     Additional Drug Therapy Needed     Significant med changes from prior encounter (inform family/care partners about these prior to discharge).    Other       Clinically significant medication issues were identified that warrant physician communication and completion of prescribed/recommended actions by midnight of the next day:  No  Name of provider notified for urgent issues identified:   Provider Method of Notification:     Pharmacist comments:   Time spent performing this drug regimen review (minutes):  30   Thank you for allowing [place] to participate in this patients care. Signe Colt, PharmD 06/11/2021 4:43 PM  **Pharmacist phone directory can be found on [email or web address] listed under [facility]**

## 2021-06-11 NOTE — Discharge Summary (Signed)
Physician Discharge Summary  [name] NOI:[id number] DOB: 02-Jul-1966 DOA: 05/28/2021  PCP: Sharion Balloon, FNP  Admit date: 05/28/2021 Discharge date: 06/11/2021  Time spent: 27 minutes  Recommendations for Outpatient Follow-up:  Will need Ancef until 07/17/2021 and then PICC line can be pulled  going to inpatient rehab for further management Recommend CBC Chem-12 weekly and further therapies at rehab  Discharge Diagnoses:  MAIN problem for hospitalization   MSSA bacteremia discitis  Please see below for itemized issues addressed in [facility]- refer to other progress notes for clarity if needed  Discharge Condition: Improved  Diet recommendation: Diabetic heart healthy  Filed Weights   05/29/21 1756 06/05/21 1431  Weight: 77.1 kg 77.1 kg    History of present illness:  55 year old white male known history of chronic pain with prior history of lumbar spondylosis stenosis + L3, L4, L5 laminectomy with decompression by Dr. 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 Ellene Route 06/05/2021 Per infectious disease and neurosurgery continue cefazolin till 07/17/21- cultures from OR 10/11 NGTD Rifaximin added back 10/14 PICC placed 10/13 Rpt MRI 10/15 shows no overt changes and I discussed this with Dr. Ellene Route who is cleared the patient to go to CIR At the end of IV antibiotic therapy will need PICC line pulled and will need follow-up with [facility] Chronic pain Pain not adequately controlled so increased MS Contin to 45 mg twice daily-IV Dilaudid stopped 10/14 Continue Oxy IR /percocet at current dose 10 q3 prn Increasing robaxin to 1000 tid for spasm DM TY 2 with neuropathy-and hyperglycemia A1c 8.4 CBGs 122 205 Continue Semglee 8 units daily, sliding scale coverage during hospital stay was given but may be stopped if tolerating metformin Resume gabapentin 800 nightly as needed Will resume metformin 1500 every morning  Jardiance has been held at patient preference (patient is concerned about risk for infection) Hypotension in setting of hypertension Not on current meds  Procedures: Laminotomy 06/05/2021  Consultations: Neurosurgery Infectious disease   Discharge Exam: Vitals:   06/10/21 2016 06/11/21 0740  BP: 116/73 117/84  Pulse: 71 65  Resp: 16 18  Temp: 98 F (36.7 C) 98.3 F (36.8 C)  SpO2: 96% 97%    Subj on day of d/c   In some pain today A little bit tearful Was able to ambulate however Pain seems out of portion to exam   General Exam on discharge  EOMI NCAT no focal deficit CTA B no added sound no rales rhonchi ROM intact except to left lower extremity diminished on straight leg raise and inability to raise left knee off of chair S1-S2 no murmur no rub no gallop Abdomen soft no  rebound no guarding  Discharge Instructions   Discharge Instructions     [facility] pharmacist to adjust dose for Vancomycin, Aminoglycosides and other anti-infective  therapies as requested by physician.   Complete by: As directed    [facility] to provide Cath Flo 66m   Complete by: As directed    Administer for PICC line occlusion and as ordered by physician for other access device issues.   Anaphylaxis Kit: Provided to treat any anaphylactic reaction to the medication being provided to the patient if First Dose or when requested by physician   Complete by: As directed    Epinephrine 161mml vial / amp: Administer 0.60m35m0.60ml65mubcutaneously once for moderate to severe anaphylaxis, nurse to call physician and pharmacy when reaction occurs and call 911 if needed for immediate care   Diphenhydramine 50mg86mIV vial: Administer 25-50mg 104mM PRN for first dose reaction, rash, itching, mild reaction, nurse to call physician and pharmacy when reaction occurs   Sodium Chloride 0.9% NS 500ml I50mdminister if needed for hypovolemic blood pressure drop or as ordered by physician after call to physician with anaphylactic reaction   Change dressing on IV access line weekly and PRN   Complete by: As directed    Diet - low sodium heart healthy   Complete by: As directed    Flush IV access with Sodium Chloride 0.9% and Heparin 10 units/ml or 100 units/ml   Complete by: As directed    Home infusion instructions - [facility]   Complete by: As directed    Instructions: Flush IV access with Sodium Chloride 0.9% and Heparin 10units/ml or 100units/ml   Change dressing on IV access line: Weekly and PRN   Instructions Cath Flo 2mg: Ad21mister for PICC Line occlusion and as ordered by physician for other access device   [facility] pharmacist to adjust dose for: Vancomycin, Aminoglycosides and other anti-infective therapies as requested by physician   Incentive spirometry RT   Complete by: As directed    Increase activity slowly   Complete by: As directed    Method of administration may be changed at the discretion of home infusion  pharmacist based upon assessment of the patient and/or caregiver's ability to self-administer the medication ordered   Complete by: As directed    No wound care   Complete by: As directed       Allergies as of 06/11/2021       Reactions   Tizanidine Other (See Comments)   Leg cramps        Medication List     STOP taking these medications    cephALEXin 500 MG capsule Commonly known as: KEFLEX   naproxen sodium 220 MG tablet Commonly known as: ALEVE       TAKE these medications    acetaminophen 500 MG tablet Commonly known as: TYLENOL Take 1 tablet (500 mg total) by mouth 4 (four) times daily. What changed:  how much to take when to take this reasons to take this   ascorbic acid 500 MG tablet Commonly known as: VITAMIN C Take 1 tablet (500 mg total) by mouth 2 (two) times daily.   buPROPion 150 MG 24 hr tablet Commonly known as: WELLBUTRIN XL Take 1 tablet (150 mg total) by mouth 2 (two) times daily.   ceFAZolin  IVPB Commonly known as: ANCEF Inject 2 g into the vein every 8 (eight) hours. Indication:  Lumbar Wound Infection/Bacteremia First Dose: Yes Last Day of Therapy:  07/17/21 Labs - Once weekly:  CBC/D and BMP, Labs - Every other week:  ESR and CRP Method of administration: IV Push Method of administration may be changed at the discretion of home infusion pharmacist based upon assessment of the patient and/or caregiver's ability to self-administer the medication ordered.   ferrous sulfate 325 (65 FE) MG tablet Take 1 tablet (325 mg total) by mouth 2 (two) times daily with a meal.   FreeStyle Libre 2 Sensor Misc Needs to check BS QID   gabapentin 800 MG tablet Commonly known as: NEURONTIN TAKE (1) TABLET THREE TIMES DAILY. What changed: See the new instructions.   insulin glargine-yfgn 100 UNIT/ML Pen Commonly known as: Semglee (yfgn) Inject 8 Units into the skin daily.   magic mouthwash Soln Take 5 mLs by mouth 4 (four) times daily. What  changed:  when to take this reasons to take this additional instructions   metFORMIN 750 MG 24 hr tablet Commonly known as: GLUCOPHAGE-XR Take 1,500 mg by mouth daily with breakfast.   methocarbamol 500 MG tablet Commonly known as: ROBAXIN Take 1 tablet (500 mg total) by mouth 2 (two) times daily as needed for muscle spasms.   morphine 30 MG 12 hr tablet Commonly known as: MS CONTIN Take 30 mg by mouth 2 (two) times daily.   oxyCODONE-acetaminophen 10-325 MG tablet Commonly known as: PERCOCET Take 1 tablet by mouth 3 (three) times daily as needed (breakthrough pain).   polyethylene glycol 17 g packet Commonly known as: MIRALAX / GLYCOLAX Take 17 g by mouth daily as needed for mild constipation.   rifampin 300 MG capsule Commonly known as: Rifadin Take 1 capsule (300 mg total) by mouth 2 (two) times daily.   rifampin 300 MG capsule Commonly known as: RIFADIN Take 1 capsule (300 mg total) by mouth every 12 (twelve) hours.   senna-docusate 8.6-50 MG tablet Commonly known as: Senokot-S Take 2 tablets by mouth daily. Start taking on: June 12, 2021               Discharge Care Instructions  (From admission, onward)           Start     Ordered   06/11/21 0000  Change dressing on IV access line weekly and PRN  (Home infusion instructions - [facility] )        06/11/21 1206           Allergies  Allergen Reactions   Tizanidine Other (See Comments)    Leg cramps      The results of significant diagnostics from this hospitalization (including imaging, microbiology, ancillary and laboratory) are listed below for reference.    Significant Diagnostic Studies: DG Lumbar Spine 2-3 Views  Result Date: 06/05/2021 CLINICAL DATA:  L1-L3 laminectomy.  Intraoperative imaging. EXAM: LUMBAR SPINE - 2-3 VIEW COMPARISON:  12/17/2017 FINDINGS: Two fluoroscopic cross-table lateral intraoperative radiographs of the lumbar spine are presented for  interpretation postprocedurally. Images are taken at 5:27 p.m. and 5:39 p.m. Initial image demonstrates anterior and posterior lumbar fusion with instrumentation of L3-S1, unchanged from prior examination. Needle-like metallic markers seen within the soft tissues posterior to the posterior elements of L2. Subsequent image demonstrates replacement of the needle-like metallic marker with a metallic probe and soft tissue retractors posterior to L2. IMPRESSION: Intraoperative radiographs as described above. Electronically Signed   By: Fidela Salisbury M.D.   On: 06/05/2021 19:52   MR Lumbar Spine W Wo Contrast  Result Date: 06/09/2021 CLINICAL DATA:  Discitis  osteomyelitis with epidural abscess. Interval lumbar decompression at L1-2 and L2-3 on 06/05/2021 EXAM: MRI LUMBAR SPINE WITHOUT AND WITH CONTRAST TECHNIQUE: Multiplanar and multiecho pulse sequences of the lumbar spine were obtained without and with intravenous contrast. CONTRAST:  7.32m GADAVIST GADOBUTROL 1 MMOL/ML IV SOLN COMPARISON:  MRI 05/28/2021 FINDINGS: Segmentation:  Standard. Alignment:  Unchanged grade 1 anterolisthesis of L5 on S1. Vertebrae: Postsurgical changes of L3-S1 posterior and interbody fusion. Ongoing changes of discitis osteomyelitis at L1-2 and L2-3. Low T1 bone marrow signal throughout the L1, L2, and L3 vertebral bodies with associated bone marrow edema and enhancement are compatible with acute osteomyelitis. No significant interval progression in the degree of endplate destruction compared to previous MRI. Enhancing ventral epidural collection at the L1 and L2 levels is similar in appearance to the previous MRI and measures up to 5 mm in thickness. No evidence of progressive epidural fluid collection. Interval decompressive laminotomies on the left at L1-2 and L2-3. Conus medullaris and cauda equina: Conus extends to the L1 level. Conus and cauda equina appear normal. Paraspinal and other soft tissues: Extensive T2 hyperintense  signal throughout the posterior paraspinal musculature and within the bilateral psoas muscles. Elongated fluid collection within the right psoas muscle does not appear increased in size compared to the prior study. Small collection within the left psoas muscle appears similar to slightly decreased in size compared to the previous study. Similar degree of prevertebral inflammatory changes without well-defined fluid collection. Stable size and appearance of presumed postoperative seroma posterior to the L4 and L5 vertebral bodies. Disc levels: T12-L1: Right paracentral disc protrusion. No high-grade stenosis. Unchanged. L1-L2: Interval posterior decompression. Moderate residual canal stenosis at this level, improved from prior. Persistent high-grade bilateral foraminal stenosis. L2-L3: Interval posterior decompression. Mild-to-moderate residual canal stenosis, improved from prior. Persistent high-grade bilateral foraminal stenosis. L3-L4: Prior fusion and posterior decompression. No evidence of residual stenosis. Unchanged. L4-L5: Prior fusion and posterior decompression. No evidence of residual stenosis. Unchanged. L5-S1: Anterolisthesis. Prior fusion and posterior decompression. No residual canal stenosis. Similar degree of bilateral foraminal stenosis. Unchanged. IMPRESSION: 1. Ongoing changes of discitis-osteomyelitis at L1-2 and L2-3. No significant interval progression in the degree of endplate destruction compared to previous MRI. 2. Enhancing ventral epidural collection at the L1 and L2 levels is similar in appearance to the previous MRI and measures up to 5 mm in thickness. No evidence of progressive epidural fluid collection. 3. Interval posterior decompression at L1-2 and L2-3 with mild-to-moderate residual canal stenosis at L2-3 and moderate residual canal stenosis at L1-2, both improved from prior. 4. Similar findings of paraspinal myositis with small bilateral psoas abscesses, not significantly changed  from prior. Electronically Signed   By: NDavina PokeD.O.   On: 06/09/2021 15:06   MR Lumbar Spine W Wo Contrast  Result Date: 05/28/2021 CLINICAL DATA:  Back pain and left leg weakness, progressively worsening weakness, decreased sensation EXAM: MRI LUMBAR SPINE WITHOUT AND WITH CONTRAST TECHNIQUE: Multiplanar and multiecho pulse sequences of the lumbar spine were obtained without and with intravenous contrast. CONTRAST:  863mGADAVIST GADOBUTROL 1 MMOL/ML IV SOLN COMPARISON:  Lumbar spine MRI 02/15/2021 FINDINGS: Segmentation: Standard; the lowest formed disc space is designated L5-S1 Alignment: Straightening of the normal lumbar spine lordosis. There is 6 mm anterolisthesis of L5 on S1, unchanged. Vertebrae: The patient is status post L3 through S1 TLIF. Hardware alignment is grossly within expected limits. There is abnormal T1 hypointensity and STIR hyperintensity in the L1 and L2 vertebral bodies and L1-L2 and  L2-L3 disc spaces. Compared to the study from 02/15/2021, endplate irregularity and disc space narrowing has worsened. There is enhancement along the posterior endplate of the L1 and L2 vertebral bodies measuring up to 5 mm in thickness at L1 (16-10), new/increased since 02/15/2021. To a lesser degree there is enhancement along the dorsal lateral aspects of the canal (17-28). There is no definite evidence of central non enhancement to suggest epidural abscess. Conus medullaris and cauda equina: Conus extends to the mid L1 level. There is no definite abnormal enhancement of the cauda equina nerve roots. Paraspinal and other soft tissues: There is abnormal T2 hyperintensity throughout the paraspinal musculature and bilateral psoas muscles. There is a 0.8 cm by 0.8 cm collection within the right psoas muscle and an irregular collection in the left psoas muscle measuring 1.2 cm x 0.9 cm concerning for residual abscesses, though overall the degree of intramuscular abscesses in the psoas muscles has  markedly improved. There is ill-defined abnormal enhancement along the ventral vertebral bodies centered at L1-L2. There is no evidence of prevertebral abscess. There is a fluid collection in the posterior soft tissues centered at the L4-L5 disc space measuring 5.9 cm cc by 2.4 cm AP, grossly similar in size and appearance. Disc levels: T12-L1: There is a mild disc bulge eccentric to the right, ligamentum flavum thickening, and bilateral facet arthropathy resulting in crowding of the right subarticular zone with no high-grade spinal canal or neural foraminal stenosis. L1-L2: Above described endplate irregularity and ventral epidural phlegmon results in severe spinal canal stenosis with compression of the cauda equina nerve roots. Phlegmon extends into the bilateral neural foramina with likely irritation of the exiting nerve roots. L2-L3: This level is suboptimally assessed due to artifact from adjacent hardware. There is bulky ligamentum flavum thickening and bilateral facet arthropathy resulting in moderate spinal canal stenosis and moderate bilateral neural foraminal stenosis L3-L4: Postsurgical changes reflecting prior TLIF are again seen. There is no residual significant spinal canal or neural foraminal stenosis. L4-L5: Postsurgical changes reflecting prior T liver again seen. There is no residual high-grade spinal canal or neural foraminal stenosis. L5-S1: There is grade 1 anterolisthesis of L5 on S1. Postsurgical changes reflecting prior T liver again seen. There is no residual high-grade spinal canal stenosis. There is moderate bilateral neural foraminal stenosis primarily due to the anterolisthesis. IMPRESSION: 1. Findings above consistent with ongoing discitis/osteomyelitis at L1-L2 and L2-L3 with progressed endplate irregularity and disc space narrowing. There is new/increased ventral epidural phlegmon along the posterior endplates centered at L1 measuring up to 5 mm in thickness resulting in severe spinal  canal stenosis with compression of the cauda equina nerve roots. No evidence of organized intra spinal epidural abscess. 2. Phlegmon is also seen along the ventral aspect of the L1 and L2 vertebral bodies without evidence of paravertebral abscess. 3. Overall interval decrease in the size and number of bilateral psoas abscesses with small residual abscesses bilaterally as above. Diffuse signal abnormality throughout the remainder of the psoas and paraspinal musculature consistent with infectious/inflammatory myositis. 4. Stable postsurgical changes reflecting L3 through S1 TLIF. Multilevel degenerative changes throughout the lumbar spine described in detail above. 5. No significant interval change in size or appearance of the fluid collection in the posterior soft tissues centered at the L4-L5 disc space favored to reflect a postoperative seroma, though infection can not be excluded by imaging. Electronically Signed   By: Valetta Mole M.D.   On: 05/28/2021 16:15   ECHOCARDIOGRAM COMPLETE  Result Date: 06/01/2021  ECHOCARDIOGRAM REPORT   Patient Name:   John Ware Date of Exam: 06/01/2021 Medical Rec #:  [id number]        Height:       70.0 in Accession #:    [id number]       Weight:       170.0 lb Date of Birth:  05/24/1966        BSA:          1.948 m Patient Age:    55 years         BP:           141/95 mmHg Patient Gender: M                HR:           92 bpm. Exam Location:  Inpatient Procedure: 2D Echo, Cardiac Doppler and Color Doppler Indications:    Bacteremia  History:        Patient has prior history of Echocardiogram examinations, most                 recent 02/03/2021. Endocarditis, Signs/Symptoms:Dyspnea; Risk                 Factors:Diabetes, Dyslipidemia and Hypertension.  Sonographer:    Wenda Low Referring Phys: [place]  1. Left ventricular ejection fraction, by estimation, is 60 to 65%. The left ventricle has normal function. The left ventricle has no  regional wall motion abnormalities. There is mild left ventricular hypertrophy. Left ventricular diastolic parameters are consistent with Grade I diastolic dysfunction (impaired relaxation).  2. Right ventricular systolic function is normal. The right ventricular size is normal.  3. The mitral valve is normal in structure. No evidence of mitral valve regurgitation. No evidence of mitral stenosis.  4. No obvious tricuspid valve vegetation seen (prior TEE vegetation was apparant).  5. The aortic valve is normal in structure. Aortic valve regurgitation is not visualized. No aortic stenosis is present.  6. The inferior vena cava is normal in size with greater than 50% respiratory variability, suggesting right atrial pressure of 3 mmHg. Conclusion(s)/Recommendation(s): No evidence of valvular vegetations on this transthoracic echocardiogram. Would recommend a transesophageal echocardiogram to exclude infective endocarditis if clinically indicated. FINDINGS  Left Ventricle: Left ventricular ejection fraction, by estimation, is 60 to 65%. The left ventricle has normal function. The left ventricle has no regional wall motion abnormalities. The left ventricular internal cavity size was normal in size. There is  mild left ventricular hypertrophy. Left ventricular diastolic parameters are consistent with Grade I diastolic dysfunction (impaired relaxation). Right Ventricle: The right ventricular size is normal. No increase in right ventricular wall thickness. Right ventricular systolic function is normal. Left Atrium: Left atrial size was normal in size. Right Atrium: Right atrial size was normal in size. Pericardium: There is no evidence of pericardial effusion. Mitral Valve: The mitral valve is normal in structure. No evidence of mitral valve regurgitation. No evidence of mitral valve stenosis. MV peak gradient, 3.0 mmHg. The mean mitral valve gradient is 1.0 mmHg. Tricuspid Valve: No obvious tricuspid valve vegetation seen  (prior TEE vegetation was apparant). The tricuspid valve is normal in structure. Tricuspid valve regurgitation is mild . No evidence of tricuspid stenosis. Aortic Valve: The aortic valve is normal in structure. Aortic valve regurgitation is not visualized. No aortic stenosis is present. Aortic valve mean gradient measures 3.0 mmHg. Aortic valve peak gradient measures 5.4 mmHg. Aortic valve area, by VTI measures  3.37 cm. Pulmonic Valve: The pulmonic valve was normal in structure. Pulmonic valve regurgitation is not visualized. No evidence of pulmonic stenosis. Aorta: The aortic root is normal in size and structure. Venous: The inferior vena cava is normal in size with greater than 50% respiratory variability, suggesting right atrial pressure of 3 mmHg. IAS/Shunts: No atrial level shunt detected by color flow Doppler.  LEFT VENTRICLE PLAX 2D LVIDd:         4.30 cm   Diastology LVIDs:         3.00 cm   LV e' medial:    6.42 cm/s LV PW:         1.30 cm   LV E/e' medial:  11.7 LV IVS:        1.30 cm   LV e' lateral:   9.68 cm/s LVOT diam:     2.20 cm   LV E/e' lateral: 7.8 LV SV:         72 LV SV Index:   37 LVOT Area:     3.80 cm  RIGHT VENTRICLE RV Basal diam:  3.05 cm RV Mid diam:    3.50 cm LEFT ATRIUM             Index        RIGHT ATRIUM           Index LA diam:        2.70 cm 1.39 cm/m   RA Area:     12.80 cm LA Vol (A2C):   39.1 ml 20.07 ml/m  RA Volume:   23.80 ml  12.22 ml/m LA Vol (A4C):   34.9 ml 17.92 ml/m LA Biplane Vol: 37.4 ml 19.20 ml/m  AORTIC VALVE                    PULMONIC VALVE AV Area (Vmax):    2.87 cm     PV Vmax:       0.91 m/s AV Area (Vmean):   2.82 cm     PV Peak grad:  3.3 mmHg AV Area (VTI):     3.37 cm AV Vmax:           116.00 cm/s AV Vmean:          78.300 cm/s AV VTI:            0.213 m AV Peak Grad:      5.4 mmHg AV Mean Grad:      3.0 mmHg LVOT Vmax:         87.50 cm/s LVOT Vmean:        58.000 cm/s LVOT VTI:          0.189 m LVOT/AV VTI ratio: 0.89  AORTA Ao Root diam:  3.40 cm Ao Asc diam:  3.60 cm MITRAL VALVE               TRICUSPID VALVE MV Area (PHT): 2.65 cm    TV Peak grad:   20.8 mmHg MV Area VTI:   2.77 cm    TV Vmax:        2.28 m/s MV Peak grad:  3.0 mmHg MV Mean grad:  1.0 mmHg    SHUNTS MV Vmax:       0.86 m/s    Systemic VTI:  0.19 m MV Vmean:      54.1 cm/s   Systemic Diam: 2.20 cm MV Decel Time: 286 msec MV E velocity: 75.40 cm/s MV A velocity: 77.60 cm/s MV E/A  ratio:  0.97 Candee Furbish MD Electronically signed by Candee Furbish MD Signature Date/Time: 06/01/2021/4:17:21 PM    Final    [place] EKG SITE RITE  Result Date: 06/06/2021 If Site Rite image not attached, placement could not be confirmed due to current cardiac rhythm.   Microbiology: Recent Results (from the past 240 hour(s))  Surgical pcr screen     Status: Abnormal   Collection Time: 06/05/21 11:17 AM   Specimen: Nasal Mucosa; Nasal Swab  Result Value Ref Range Status   MRSA, PCR NEGATIVE NEGATIVE Final   Staphylococcus aureus POSITIVE (A) NEGATIVE Final    Comment: (NOTE) The Xpert SA Assay (FDA approved for NASAL specimens in patients 34 years of age and older), is one component of a comprehensive surveillance program. It is not intended to diagnose infection nor to guide or monitor treatment. Performed at [facility], [place] [address] [place], [place] [address]   Aerobic/Anaerobic Culture w Gram Stain (surgical/deep wound)     Status: None   Collection Time: 06/05/21  6:11 PM   Specimen: Soft Tissue, Other  Result Value Ref Range Status   Specimen Description WOUND LUMBAR  Final   Special Requests LUMBAR DISC PT ON ANCEF  Final   Gram Stain   Final    FEW SQUAMOUS EPITHELIAL CELLS PRESENT MODERATE WBC SEEN NO ORGANISMS SEEN    Culture   Final    No growth aerobically or anaerobically. Performed at [facility], [place] [address] [place], [place] [address]    Report Status 06/10/2021 FINAL  Final     Labs: Basic Metabolic Panel: Recent Labs  Lab  06/05/21 0259 06/06/21 0057 06/07/21 0330 06/11/21 0304  NA 130* 130* 131* 133*  K 4.4 4.7 3.8 4.0  CL 94* 93* 95* 98  CO2 '29 28 26 27  ' GLUCOSE 159* 313* 176* 123*  BUN '15 12 13 8  ' CREATININE 0.51* 0.53* 0.55* 0.43*  CALCIUM 9.2 9.0 8.7* 9.0  MG 1.7  --   --   --   PHOS 4.4  --   --   --    Liver Function Tests: Recent Labs  Lab 06/11/21 0304  AST 12*  ALT 10  ALKPHOS 92  BILITOT 0.6  PROT 6.9  ALBUMIN 2.5*   No results for input(s): LIPASE, AMYLASE in the last 168 hours. No results for input(s): AMMONIA in the last 168 hours. CBC: Recent Labs  Lab 06/05/21 0259 06/06/21 0057 06/07/21 0330 06/11/21 0304  WBC 10.9* 9.2 9.6 12.7*  NEUTROABS  --   --   --  8.7*  HGB 11.8* 12.9* 10.9* 11.3*  HCT 36.6* 39.4 33.1* 35.6*  MCV 81.0 81.4 80.3 82.2  PLT 472* 510* 496* 573*   Cardiac Enzymes: No results for input(s): CKTOTAL, CKMB, CKMBINDEX, TROPONINI in the last 168 hours. BNP: BNP (last 3 results) No results for input(s): BNP in the last 8760 hours.  ProBNP (last 3 results) No results for input(s): PROBNP in the last 8760 hours.  CBG: Recent Labs  Lab 06/10/21 1157 06/10/21 1538 06/10/21 2020 06/11/21 0739 06/11/21 1154  GLUCAP 99 124* 95 121* 205*       Signed:  Nita Sells MD   [facility] 06/11/2021, 12:06 PM

## 2021-06-11 NOTE — Progress Notes (Signed)
Patient ID: John Ware, male   DOB: 1966-04-14, 55 y.o.   MRN: [id number] I have reviewed Marty's MRI of the lumbar spine.  He still has atypical inflammatory changes at L1-2 and L2-3.  His stenosis does appear better.  Also material from the disc for culture.  He continues to have significant weakness in the leg but he is to go to rehab today.  I have encouraged him in that regard.  I plan on seeing him in follow-up in a month's time as an outpatient.

## 2021-06-11 NOTE — Progress Notes (Signed)
Pharmacy Antibiotic Note  John Ware is a 55 y.o. male admitted on 06/11/2021 with bacteremia secondary to lumbar infection.  Pharmacy has been consulted for vancomycin dosing.  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 S/P Laminectomy and epidural debridement 10/11. Staph aureus from 10/3 blood cx is identified as MRSA now.   The patient has transferred into CIR - was receiving Vancomycin 1250 mg IV every 12 hours with planned levels for this evening and tomorrow morning. Last dose on 10/17 AM - will continue with same dosing and will order levels as appropriate based on charting.   Plan: - Continue Vancomcyin 1250 mg IV q12h (eAUC470, Scr 0.8) Goal AUC 400-550 - Will continue to follow renal function, culture results, LOT, and antibiotic de-escalation plans      Temp (24hrs), Avg:98.3 F (36.8 C), Min:98 F (36.7 C), Max:98.8 F (37.1 C)  Recent Labs  Lab 06/05/21 0259 06/06/21 0057 06/07/21 0330 06/11/21 0304  WBC 10.9* 9.2 9.6 12.7*  CREATININE 0.51* 0.53* 0.55* 0.43*     Estimated Creatinine Clearance: 110.3 mL/min (A) (by C-G formula based on SCr of 0.43 mg/dL (L)).    Allergies  Allergen Reactions   Tizanidine Other (See Comments)    Leg cramps    Antimicrobials this admission: Cefazolin  10/3 >> 10/14 Vancomycin 10/14 >>   Dose adjustments this admission: None  Microbiology results: 10/3 BCx: MRSA now (previously MSSA) 10/11 Lumbar cx: pending  Thank you for allowing pharmacy to be a part of this patient's care.  12/11, PharmD, BCPS Clinical Pharmacist 06/11/2021 4:13 PM   **Pharmacist phone directory can now be found on [email or web address] ([email or web address]).  Listed under [facility].

## 2021-06-11 NOTE — Progress Notes (Addendum)
PMR Admission Coordinator Pre-Admission Assessment   Patient: John Ware is an 55 y.o., male MRN: [id number] DOB: [birth year] Height: _0  (177.8 cm) Weight: 77.1 kg   Insurance Information HMO:     PPO: yes     PCP:      IPA:      80/20:      OTHER:  PRIMARY: UMR         Policy#: [id number]      Subscriber: pt CM Name: Erline Levine      Phone#: [phone]    Fax#: [phone] Pre-Cert#: [id number] Josem Kaufmann for CIR from [facility]  with updates due to [facility]  at fax listed above on 06/10/21     Pt. Approved for admission 10/17 for 7 days with updates due 06/18/21  Employer:  Benefits:  Phone #: [phone]     Name:  Eff. Date: 05/26/18     Deduct: $1500 met      Out of Pocket Max: $4000 met      Life Max: n/a CIR: 80%      SNF: 80% Outpatient: 80%     Co-Ins: 20% Home Health: 80%      Co-Ins: 20% DME: 80%     Co-Ins: 20% Providers:  SECONDARY:       Policy#:      Phone#:    Development worker, community:       Phone#:    The Therapist, art Information Summary" for patients in Inpatient Rehabilitation Facilities with attached "Privacy Act [place] Records" was provided and verbally reviewed with: N/A   Emergency Contact Information Contact Information       Name Relation Home Work Mobile    [place] Spouse [phone]   [phone]    Chandran,Francis Mother [phone]        Gasper Lloyd Niece     [phone]           Current Medical History  Patient Admitting Diagnosis: discitis, osteomyelitis, lumbar region   History of Present Illness: Pt is a 55 y/o male with PMH of HTN, chronic pain, DM, covid, MSSA bacteremia, and depression admitted to [facility] on 10/3 with ongoing c/o LLE weakness and pain.  Pt with recent history and prolonged hospital stay following interbody fusion Z0-S9 complicated by MSSA bacteremia and osteomyelitis.  Workup revealed recurrent osteomyelitis with new epidural phlegmon.  On 10/12 pt underwent L1-3 lumbar laminotomies  and decompression with dural tube and nerve root decompression per Dr. Ellene Route.  Post op course pain management and pt was recommended for CIR by therapy.     Patient's [facility] record from [facility] has been reviewed by the rehabilitation admission coordinator and physician.   Past Medical History      Past Medical History:  Diagnosis Date   Depression     Diabetes mellitus without complication ([facility])     Hypertension     Spinal stenosis      With Neurogenic Claudication      Has the patient had major surgery during 100 days prior to admission? Yes   Family History   family history is not on file.   Current Medications   Current Facility-Administered Medications:    (feeding supplement) PROSource Plus liquid 30 mL, 30 mL, Oral, BID BM, Samtani, Jai-Gurmukh, MD, 30 mL at 06/11/21 0818   0.9 %  sodium chloride infusion, 250 mL, Intravenous, Continuous, Elsner, Henry, MD, Last Rate: 1 mL/hr at 06/05/21 2221, 250 mL at 06/05/21 2221   bisacodyl (DULCOLAX) suppository 10  mg, 10 mg, Rectal, Daily PRN, Kristeen Miss, MD   Chlorhexidine Gluconate Cloth 2 % PADS 6 each, 6 each, Topical, Daily, Kristeen Miss, MD   gabapentin (NEURONTIN) capsule 800 mg, 800 mg, Oral, QHS PRN, Kristeen Miss, MD, 800 mg at 06/11/21 0411   insulin aspart (novoLOG) injection 0-9 Units, 0-9 Units, Subcutaneous, TID WC, Kristeen Miss, MD, 1 Units at 06/11/21 0819   insulin glargine-yfgn (SEMGLEE) injection 8 Units, 8 Units, Subcutaneous, Daily, Kristeen Miss, MD, 8 Units at 06/11/21 0820   menthol-cetylpyridinium (CEPACOL) lozenge 3 mg, 1 lozenge, Oral, PRN **OR** phenol (CHLORASEPTIC) mouth spray 1 spray, 1 spray, Mouth/Throat, PRN, Kristeen Miss, MD   metFORMIN (GLUCOPHAGE-XR) 24 hr tablet 1,500 mg, 1,500 mg, Oral, Q breakfast, Verlon Au, Jai-Gurmukh, MD, 1,500 mg at 06/11/21 0817   methocarbamol (ROBAXIN) tablet 1,000 mg, 1,000 mg, Oral, TID, Nita Sells, MD, 1,000 mg at 06/11/21 0816   morphine (MS CONTIN)  12 hr tablet 45 mg, 45 mg, Oral, BID, Nita Sells, MD, 45 mg at 06/11/21 1003   multivitamin with minerals tablet 1 tablet, 1 tablet, Oral, Daily, Kristeen Miss, MD, 1 tablet at 06/11/21 0818   naloxone (NARCAN) injection 0.4 mg, 0.4 mg, Intravenous, PRN, Kristeen Miss, MD   ondansetron (ZOFRAN) tablet 4 mg, 4 mg, Oral, Q6H PRN **OR** ondansetron (ZOFRAN) injection 4 mg, 4 mg, Intravenous, Q6H PRN, Kristeen Miss, MD   oxyCODONE-acetaminophen (PERCOCET/ROXICET) 5-325 MG per tablet 1 tablet, 1 tablet, Oral, Q3H PRN, 1 tablet at 06/11/21 0818 **AND** oxyCODONE (Oxy IR/ROXICODONE) immediate release tablet 5 mg, 5 mg, Oral, Q3H PRN, Nita Sells, MD, 5 mg at 06/11/21 0818   polyethylene glycol (MIRALAX / GLYCOLAX) packet 17 g, 17 g, Oral, Daily PRN, Kristeen Miss, MD   protein supplement (ENSURE MAX) liquid, 11 oz, Oral, QHS, Kristeen Miss, MD, 11 oz at 06/10/21 2115   rifampin (RIFADIN) capsule 300 mg, 300 mg, Oral, Q12H, Rosiland Oz, MD, 300 mg at 06/11/21 0816   senna-docusate (Senokot-S) tablet 2 tablet, 2 tablet, Oral, Daily, Kristeen Miss, MD, 2 tablet at 06/09/21 0830   sodium chloride flush (NS) 0.9 % injection 10-40 mL, 10-40 mL, Intracatheter, Q12H, Samtani, Jai-Gurmukh, MD, 10 mL at 06/10/21 0853   sodium chloride flush (NS) 0.9 % injection 10-40 mL, 10-40 mL, Intracatheter, PRN, Verlon Au, Jai-Gurmukh, MD   sodium chloride flush (NS) 0.9 % injection 3 mL, 3 mL, Intravenous, Q12H, Kristeen Miss, MD, 3 mL at 06/11/21 0820   sodium chloride flush (NS) 0.9 % injection 3 mL, 3 mL, Intravenous, PRN, Kristeen Miss, MD   sodium phosphate (FLEET) 7-19 GM/118ML enema 1 enema, 1 enema, Rectal, Once PRN, Kristeen Miss, MD   vancomycin (VANCOREADY) IVPB 1250 mg/250 mL, 1,250 mg, Intravenous, Q12H, Willette Cluster, RPH, Last Rate: 166.7 mL/hr at 06/11/21 0407, 1,250 mg at 06/11/21 0407   Patients Current Diet:  Diet Order                  Diet Carb Modified Fluid consistency:  Thin; Room service appropriate? Yes  Diet effective now                         Precautions / Restrictions Precautions Precautions: Fall, Back Precaution Booklet Issued: No Precaution Comments: Reviewed back precautions Restrictions Weight Bearing Restrictions: No    Has the patient had 2 or more falls or a fall with injury in the past year? Yes   Prior Activity Level Limited Community (1-2x/wk): independent prior to original surgery  earlier this year, currently not able to drive, ambulates with RW   Prior Functional Level Self Care: Did the patient need help bathing, dressing, using the toilet or eating? Needed some help   Indoor Mobility: Did the patient need assistance with walking from room to room (with or without device)? Needed some help   Stairs: Did the patient need assistance with internal or external stairs (with or without device)? Needed some help   Functional Cognition: Did the patient need help planning regular tasks such as shopping or remembering to take medications? Independent   Patient Information Are you of Hispanic, Latino/a,or Spanish origin?: A. No, not of Hispanic, Latino/a, or Spanish origin What is your race?: A. White Do you need or want an interpreter to communicate with a doctor or health care staff?: 0. No   Patient's Response To:  Health Literacy and Transportation Is the patient able to respond to health literacy and transportation needs?: Yes Health Literacy - How often do you need to have someone help you when you read instructions, pamphlets, or other written material from your doctor or pharmacy?: Sometimes In the past 12 months, has lack of transportation kept you from medical appointments or from getting medications?: No In the past 12 months, has lack of transportation kept you from meetings, work, or from getting things needed for daily living?: No   Development worker, international aid / [place] Devices/Equipment: Radio producer (specify  quad or straight), Eyeglasses, Environmental consultant (specify type) Home Equipment: Shower seat, Cane - single point, Walker - 4 wheels   Prior Device Use: Indicate devices/aids used by the patient prior to current illness, exacerbation or injury? Walker   Current Functional Level Cognition   Overall Cognitive Status: Within Functional Limits for tasks assessed Orientation Level: Oriented X4 General Comments: needs cues to refrain from bending forward once sitting upright; reports it feels better    Extremity Assessment (includes Sensation/Coordination)   Upper Extremity Assessment: Generalized weakness  Lower Extremity Assessment: Defer to PT evaluation LLE Deficits / Details: Grossly ~2-/5 hip flexion, 3/5 knee flexion, 2-/5 knee extension, 5/5 DF/PF LLE Sensation: decreased light touch LLE Coordination: decreased fine motor, decreased gross motor     ADLs   Overall ADL's : Needs assistance/impaired Eating/Feeding: Set up, Sitting Grooming: Set up, Sitting Upper Body Bathing: Supervision/ safety, Set up, Sitting Lower Body Bathing: Moderate assistance, Sitting/lateral leans, Sit to/from stand Upper Body Dressing : Supervision/safety, Set up Upper Body Dressing Details (indicate cue type and reason): Limited ROM due to prior shoudler sx Lower Body Dressing: Moderate assistance, Sitting/lateral leans Lower Body Dressing Details (indicate cue type and reason): Mod A for LB dressing due to limited ROM with pain Toilet Transfer: Moderate assistance, Stand-pivot, BSC, RW Toilet Transfer Details (indicate cue type and reason): Pt reports that he feels weak after having worked with PT earlier today, resulting in increased difficulty with stand-pivot transfer to [facility] from recliner. Functional mobility during ADLs: Min guard, Rolling walker General ADL Comments: Pt continues to be limited by pain, weakness, and activity tolerance.     Mobility   Overal bed mobility: Needs Assistance Bed Mobility:  Rolling, Sidelying to Sit Rolling: Min assist Sidelying to sit: Mod assist, HOB elevated Sit to sidelying: Min assist, HOB elevated General bed mobility comments: Assist to bring LLE to EOB and elevate trunk to get upright.     Transfers   Overall transfer level: Needs assistance Equipment used: Rolling walker (2 wheeled) Transfer via Lift Equipment: Stedy Transfers: Sit to/from [employer]	[place]  Sit to Stand: Mod assist, Min assist, From elevated surface General transfer comment: Assist to power to standing from EOB with Min A (elevated height), mod A from low chair/BSC. Difficulty placing     Ambulation / Gait / Stairs / Wheelchair Mobility   Ambulation/Gait Ambulation/Gait assistance: Herbalist (Feet): 7 Feet (+ 4' x2) Assistive device: Rolling walker (2 wheeled) Gait Pattern/deviations: Step-through pattern, Decreased stride length, Trunk flexed, Narrow base of support General Gait Details: Slow, unsteady gait with left knee instability during stance phase and difficulty advancing LLE due to weak hip flexors. Therapist supporting left knee to prevent buckling. ABle to side step along side bed in both directions with assist for balance. Short steps. Gait velocity: decreased Gait velocity interpretation: <1.31 ft/sec, indicative of household ambulator     Posture / Balance Dynamic Sitting Balance Sitting balance - Comments: Leaning forward on legs to stretch out back reporting comfort and ease of pain;  prefers to have BUE support Balance Overall balance assessment: Needs assistance Sitting-balance support: Feet supported, Bilateral upper extremity supported Sitting balance-[name]: Fair Sitting balance - Comments: Leaning forward on legs to stretch out back reporting comfort and ease of pain;  prefers to have BUE support Standing balance support: During functional activity Standing balance-[name]: Poor Standing balance comment: reliant on BUE support     Special  needs/care consideration Skin surgical incision ; Continuous IV Ancef    Previous Home Environment (from acute therapy documentation) Living Arrangements: Spouse/significant other Available Help at Discharge: Family Type of Home: House Home Layout: One level Home Access: Level entry Bathroom Shower/Tub: Chiropodist: Handicapped height Bathroom Accessibility: Yes Home Care Services: No Additional Comments: pt unreliable historian and no family present. Pt reports prior to fall on 5/28 he was independent.   Discharge Living Setting Plans for Discharge Living Setting: Patient's home, Lives with (comment) (spouse) Type of Home at Discharge: House Discharge Home Layout: One level Discharge Home Access: Level entry Discharge Bathroom Shower/Tub: Tub/shower unit Discharge Bathroom Toilet: Handicapped height Discharge Bathroom Accessibility: Yes How Accessible: Accessible via walker Does the patient have any problems obtaining your medications?: No   Social/Family/Support Systems Patient Roles: Spouse Anticipated Caregiver: spouse, [name] Anticipated Ambulance person Information: [phone] Ability/Limitations of Caregiver: works from home, supervision to light min assist Caregiver Availability: 24/7 Discharge Plan Discussed with Primary Caregiver: Yes Is Caregiver In Agreement with Plan?: Yes Does Caregiver/Family have Issues with Lodging/Transportation while Pt is in Rehab?: No   Goals Patient/Family Goal for Rehab: PT/OT supervision w/c level, min assist ambulatory, SLP n/a Expected length of stay: 14-18 days Pt/Family Agrees to Admission and willing to participate: Yes Program Orientation Provided & Reviewed with Pt/Caregiver Including Roles  & Responsibilities: Yes   Decrease burden of Care through IP rehab admission: n/a   Possible need for SNF placement upon discharge: no   Patient Condition: I have reviewed medical records from [facility], spoken  with CM, and patient and spouse. I met with patient at the bedside for inpatient rehabilitation assessment.  Patient will benefit from ongoing PT and OT, can actively participate in 3 hours of therapy a day 5 days of the week, and can make measurable gains during the admission.  Patient will also benefit from the coordinated team approach during an Inpatient Acute Rehabilitation admission.  The patient will receive intensive therapy as well as Rehabilitation physician, nursing, social worker, and care management interventions.  Due to bladder management, bowel management, safety, skin/wound care, disease management,  medication administration, pain management, and patient education the patient requires 24 hour a day rehabilitation nursing.  The patient is currently min to mod assist with mobility and basic ADLs.  Discharge setting and therapy post discharge at home with home health is anticipated.  Patient has agreed to participate in the Acute Inpatient Rehabilitation Program and will admit 06/11/21.   Preadmission Screen Completed By: Shann Medal, PT, DPT and  Genella Mech, 06/11/2021 12:00 PM ______________________________________________________________________   Discussed status with Dr. Letta Pate on 06/11/21 at 68 and received approval for admission today.   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 Yes Does the patient require coordinated care of a physician, rehab nurse, PT, OT, and SLP to address physical and functional deficits in the context of the above medical diagnosis(es)? Yes Addressing deficits in the following areas: balance, endurance, locomotion, strength, transferring, bowel/bladder control, bathing, dressing, feeding, grooming, toileting, and psychosocial support Can the patient actively participate in an intensive therapy program of at least 3 hrs of therapy 5 days a week? Yes The potential for patient to make measurable gains while on inpatient rehab is good Anticipated functional outcomes upon discharge from inpatient rehab: supervision PT, supervision OT, n/a SLP Estimated rehab length of stay to reach the above functional goals is: 14-18d Anticipated discharge destination: Home 10. Overall Rehab/Functional Prognosis: good     MD Signature: Charlett Blake M.D. [facility] Fellow Am Acad of Phys Med and Rehab Diplomate Am Board of Electrodiagnostic Med Fellow Am Board of Interventional Pain

## 2021-06-11 NOTE — Progress Notes (Signed)
Inpatient Rehab Admissions Coordinator:  ° °I have a CIR bed for this pt. Today. RN may call report to [phone] after 12pm. ° °Burnie Hank, MS, CCC-SLP °Rehab Admissions Coordinator  °[phone] (celll) °[phone] (office) ° °

## 2021-06-11 NOTE — Progress Notes (Signed)
[facility] Infectious Diseases Follow Up Note  Patient Identification: Patient Name: John Ware MRN: [id number] Admit Date: 05/28/2021  1:21 PM Age: 55 y.o.Today's Date: 06/11/2021   Reason for Visit: Disseminated MSSA infection with lumbar spine infection   Principal Problem:   Infection of spine (HCC) Active Problems:   Depression, recurrent (HCC)   Diabetes mellitus (HCC)   Diabetic polyneuropathy associated with type 2 diabetes mellitus (HCC)   HTN (hypertension)   Endocarditis   Psoas abscess (HCC)   Antibiotics: Cefazolin 10/3-10/14                    Rifampin 10/14 -current                     Vancomycin 10/14-current   Lines/Hardwares: Lumbar hardware   Interval Events: continues to be afebrile, WBC 12.7. 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 OR cx no growth to date ( final ). Repeat MRI L spine 10/15 - stable  Prior h/o disseminated MSSA infection ( TV endocarditis, thoracic and lumbar osteomyelitis, bilateral psoas muscle abscesses, bilateral shoulder septic joint arthritis and osteomyelitis  Left shoulder septic arthritis ( PsA)   Recommendations Continue vancomycin, pharmacy to dose.- plan for 8 weeks from date of OR [facility] Neurosurgery recommendations  Monitor CBC, BMP , Vancomycin trough  Planned to go to CIR 5.   Following intermittently this week   Rest of the management as per the primary team. Thank you for the consult. Please page with pertinent questions or concerns.  ______________________________________________________________________ Subjective patient seen and examined at the bedside. Denies any nasuea,  vomiting, abdominal pain and diarrhea   Vitals BP 117/84 (BP Location: Left Arm)   Pulse 65   Temp 98.3 F (36.8 C) (Oral)   Resp 18   Ht 5\' 10"  (1.778 m)   Wt 77.1 kg   SpO2 97%   BMI 24.39 kg/m     Physical Exam He is sleeping in bed and would like to sleep more. Hence, did not examine  Denies any improvement in his left leg weakness.    Pertinent Microbiology Results for orders placed or performed during the hospital encounter of 05/28/21  Culture, blood (Routine X 2) w Reflex to ID Panel     Status: None   Collection Time: 05/28/21  5:20 PM   Specimen: BLOOD  Result Value Ref Range Status   Specimen Description   Final    BLOOD LEFT ANTECUBITAL Performed at [facility], [address], [place], [place]    Special Requests   Final    BOTTLES DRAWN AEROBIC AND ANAEROBIC Blood Culture adequate volume Performed at [facility], [address] [place], [place]    Culture   Final    NO GROWTH 5 DAYS Performed at [facility], [address], [place], [place]    Report Status 06/02/2021 FINAL  Final  Culture, blood (Routine X 2) w Reflex to ID Panel     Status: Abnormal (Preliminary result)   Collection Time: 05/28/21  5:22 PM   Specimen: BLOOD  Result Value Ref Range Status   Specimen Description BLOOD RIGHT ANTECUBITAL  Final  Special Requests   Final    BOTTLES DRAWN AEROBIC AND ANAEROBIC Blood Culture results may not be optimal due to an excessive volume of blood received in culture bottles   Culture  Setup Time   Final    GRAM POSITIVE COCCI ANAEROBIC BOTTLE ONLY CRITICAL RESULT CALLED TO, READ BACK BY AND VERIFIED WITH: PHARMD GREG ABBOTT 05/31/2021@2 :56 BY TW    Culture (A)  Final    STAPHYLOCOCCUS AUREUS Sent to [facility] for further susceptibility testing.    Report Status PENDING  Incomplete   Organism ID, Bacteria STAPHYLOCOCCUS AUREUS  Final      Susceptibility    Staphylococcus aureus - MIC*    RIFAMPIN Value in next row Sensitive      SENSITIVE0.5Performed at [facility], [address] [place], [place] [address]    * STAPHYLOCOCCUS AUREUS  Blood Culture ID Panel (Reflexed)     Status: Abnormal   Collection Time: 05/28/21  5:22 PM  Result Value Ref Range Status   Enterococcus faecalis NOT DETECTED NOT DETECTED Final   Enterococcus Faecium NOT DETECTED NOT DETECTED Final   Listeria monocytogenes NOT DETECTED NOT DETECTED Final   Staphylococcus species DETECTED (A) NOT DETECTED Final    Comment: CRITICAL RESULT CALLED TO, READ BACK BY AND VERIFIED WITH: PHARMD GREG ABBOTT 05/31/2021@2 :56 BY TW    Staphylococcus aureus (BCID) DETECTED (A) NOT DETECTED Final    Comment: CRITICAL RESULT CALLED TO, READ BACK BY AND VERIFIED WITH: PHARMD GREG ABBOTT 05/31/2021@2 :56 BY TW    Staphylococcus epidermidis NOT DETECTED NOT DETECTED Final   Staphylococcus lugdunensis NOT DETECTED NOT DETECTED Final   Streptococcus species NOT DETECTED NOT DETECTED Final   Streptococcus agalactiae NOT DETECTED NOT DETECTED Final   Streptococcus pneumoniae NOT DETECTED NOT DETECTED Final   Streptococcus pyogenes NOT DETECTED NOT DETECTED Final   A.calcoaceticus-baumannii NOT DETECTED NOT DETECTED Final   Bacteroides fragilis NOT DETECTED NOT DETECTED Final   Enterobacterales NOT DETECTED NOT DETECTED Final   Enterobacter cloacae complex NOT DETECTED NOT DETECTED Final   Escherichia coli NOT DETECTED NOT DETECTED Final   Klebsiella aerogenes NOT DETECTED NOT DETECTED Final   Klebsiella oxytoca NOT DETECTED NOT DETECTED Final   Klebsiella pneumoniae NOT DETECTED NOT DETECTED Final   Proteus species NOT DETECTED NOT DETECTED Final   Salmonella species NOT DETECTED NOT DETECTED Final   Serratia marcescens NOT DETECTED NOT DETECTED Final   Haemophilus influenzae NOT DETECTED NOT DETECTED Final   Neisseria meningitidis NOT DETECTED NOT DETECTED Final   Pseudomonas  aeruginosa NOT DETECTED NOT DETECTED Final   Stenotrophomonas maltophilia NOT DETECTED NOT DETECTED Final   Candida albicans NOT DETECTED NOT DETECTED Final   Candida auris NOT DETECTED NOT DETECTED Final   Candida glabrata NOT DETECTED NOT DETECTED Final   Candida krusei NOT DETECTED NOT DETECTED Final   Candida parapsilosis NOT DETECTED NOT DETECTED Final   Candida tropicalis NOT DETECTED NOT DETECTED Final   Cryptococcus neoformans/gattii NOT DETECTED NOT DETECTED Final   Meth resistant mecA/C and MREJ NOT DETECTED NOT DETECTED Final    Comment: Performed at [facility], [address] [place], [place] [address]  Susceptibility, Aer + Anaerob     Status: Abnormal   Collection Time: 05/28/21  5:22 PM  Result Value Ref Range Status   Suscept, Aer + Anaerob Final report (A)  Corrected    Comment: (NOTE) Performed At: [facility] [address] [place], [place] [id number] Jolene Schimke MD CZ:[id number] CORRECTED ON  10/14 AT 0837: PREVIOUSLY REPORTED AS Preliminary report    Source of Sample 8,680  Final    Comment: S.AUREUS SUSCEPTIBILITY BLOOD CULTURE Performed at [facility], [address] [place], [place] [address]   Susceptibility Result     Status: Abnormal   Collection Time: 05/28/21  5:22 PM  Result Value Ref Range Status   Suscept Result 1 Staphylococcus aureus (A)  Final    Comment: (NOTE) Identification performed by account, not confirmed by this laboratory. Methicillin resistant (MRSA) Based on resistance to oxacillin this isolate would be resistant to all currently available beta-lactam antimicrobial agents, with the exception of the newer cephalosporins with anti-MRSA activity, such as Ceftaroline    Antimicrobial Suscept Comment  Corrected    Comment: (NOTE)      ** S = Susceptible; I = Intermediate; R = Resistant **                   P = Positive; N = Negative            MICS are expressed in micrograms per mL   Antibiotic                  RSLT#1    RSLT#2    RSLT#3    RSLT#4 Ciprofloxacin                  R Clindamycin                    S Erythromycin                   R Gentamicin                     S Levofloxacin                   I Linezolid                      S Oxacillin                      R Penicillin                     R Rifampin                       S Tetracycline                   S Trimethoprim/Sulfa             S Vancomycin                     S Performed At: [facility] [employer] [address] [place], [place] [id number] Jolene Schimke MD VZ:[id number]   Resp Panel by RT-PCR (Flu A&B, Covid) Nasopharyngeal Swab     Status: None   Collection Time: 05/28/21  5:30 PM   Specimen: Nasopharyngeal Swab; Nasopharyngeal(NP) swabs in vial transport medium  Result Value Ref Range Status   SARS Coronavirus 2 by RT PCR NEGATIVE NEGATIVE Final    Comment: (NOTE) SARS-CoV-2 target nucleic acids are NOT DETECTED.  The SARS-CoV-2 RNA is generally detectable in upper respiratory specimens during the acute phase of infection. The lowest concentration of SARS-CoV-2 viral copies this assay can detect is 138 copies/mL. A negative result does not preclude SARS-Cov-2 infection and should not be used as the sole  basis for treatment or other patient management decisions. A negative result may occur with  improper specimen collection/handling, submission of specimen other than nasopharyngeal swab, presence of viral mutation(s) within the areas targeted by this assay, and inadequate number of viral copies(<138 copies/mL). A negative result must be combined with clinical observations, patient history, and epidemiological information. The expected result is Negative.  Fact Sheet for Patients:  BloggerCourse.com  Fact Sheet for Healthcare Providers:  SeriousBroker.it  This test is no t yet approved or cleared by the [place] FDA and  has been authorized for  detection and/or diagnosis of SARS-CoV-2 by FDA under an Emergency Use Authorization (EUA). This EUA will remain  in effect (meaning this test can be used) for the duration of the COVID-19 declaration under Section 564(b)(1) of the Act, 21 U.S.C.section 360bbb-3(b)(1), unless the authorization is terminated  or revoked sooner.       Influenza A by PCR NEGATIVE NEGATIVE Final   Influenza B by PCR NEGATIVE NEGATIVE Final    Comment: (NOTE) The Xpert Xpress SARS-CoV-2/FLU/RSV plus assay is intended as an aid in the diagnosis of influenza from Nasopharyngeal swab specimens and should not be used as a sole basis for treatment. Nasal washings and aspirates are unacceptable for Xpert Xpress SARS-CoV-2/FLU/RSV testing.  Fact Sheet for Patients: BloggerCourse.com  Fact Sheet for Healthcare Providers: SeriousBroker.it  This test is not yet approved or cleared by the [place] FDA and has been authorized for detection and/or diagnosis of SARS-CoV-2 by FDA under an Emergency Use Authorization (EUA). This EUA will remain in effect (meaning this test can be used) for the duration of the COVID-19 declaration under Section 564(b)(1) of the Act, 21 U.S.C. section 360bbb-3(b)(1), unless the authorization is terminated or revoked.  Performed at [facility], [address] [place], [place] [address]   Culture, blood (routine x 2)     Status: None   Collection Time: 06/01/21  6:02 AM   Specimen: BLOOD RIGHT HAND  Result Value Ref Range Status   Specimen Description BLOOD RIGHT HAND  Final   Special Requests   Final    AEROBIC BOTTLE ONLY Blood Culture results may not be optimal due to an inadequate volume of blood received in culture bottles   Culture   Final    NO GROWTH 5 DAYS Performed at [facility], [address], [place], [place] [address]    Report Status 06/06/2021 FINAL  Final  Culture, blood (routine  x 2)     Status: None   Collection Time: 06/01/21  6:02 AM   Specimen: BLOOD LEFT HAND  Result Value Ref Range Status   Specimen Description BLOOD LEFT HAND  Final   Special Requests   Final    AEROBIC BOTTLE ONLY Blood Culture results may not be optimal due to an inadequate volume of blood received in culture bottles   Culture   Final    NO GROWTH 5 DAYS Performed at [facility], [address] [place], [place] [address]    Report Status 06/06/2021 FINAL  Final  Surgical pcr screen     Status: Abnormal   Collection Time: 06/05/21 11:17 AM   Specimen: Nasal Mucosa; Nasal Swab  Result Value Ref Range Status   MRSA, PCR NEGATIVE NEGATIVE Final   Staphylococcus aureus POSITIVE (A) NEGATIVE Final    Comment: (NOTE) The Xpert SA Assay (FDA approved for NASAL specimens in patients 24 years of age and older), is one component  of a comprehensive surveillance program. It is not intended to diagnose infection nor to guide or monitor treatment. Performed at [facility], [address], [place], [place] [address]   Aerobic/Anaerobic Culture w Gram Stain (surgical/deep wound)     Status: None   Collection Time: 06/05/21  6:11 PM   Specimen: Soft Tissue, Other  Result Value Ref Range Status   Specimen Description WOUND LUMBAR  Final   Special Requests LUMBAR DISC PT ON ANCEF  Final   Gram Stain   Final    FEW SQUAMOUS EPITHELIAL CELLS PRESENT MODERATE WBC SEEN NO ORGANISMS SEEN    Culture   Final    No growth aerobically or anaerobically. Performed at [facility], [address], [place], [place] [address]    Report Status 06/10/2021 FINAL  Final      Pertinent Lab. CBC Latest Ref Rng & Units 06/11/2021 06/07/2021 06/06/2021  WBC 4.0 - 10.5 K/uL 12.7(H) 9.6 9.2  Hemoglobin 13.0 - 17.0 g/dL 11.3(L) 10.9(L) 12.9(L)  Hematocrit 39.0 - 52.0 % 35.6(L) 33.1(L) 39.4  Platelets 150 - 400 K/uL 573(H) 496(H) 510(H)   CMP Latest Ref Rng & Units 06/11/2021 06/07/2021  06/06/2021  Glucose 70 - 99 mg/dL 098(J) 191(Y) 782(N)  BUN 6 - 20 mg/dL Creatinine 0.61 - 1.24 mg/dL 5.62(Z) 3.08(M) 5.78(I)  Sodium 135 - 145 mmol/L 133(L) 131(L) 130(L)  Potassium 3.5 - 5.1 mmol/L 4.0 3.8 4.7  Chloride 98 - 111 mmol/L 98 95(L) 93(L)  CO2 22 - 32 mmol/L Calcium 8.9 - 10.3 mg/dL 9.0 6.9(G) 9.0  Total Protein 6.5 - 8.1 g/dL 6.9 - -  Total Bilirubin 0.3 - 1.2 mg/dL 0.6 - -  Alkaline Phos 38 - 126 U/L 92 - -  AST 15 - 41 U/L 12(L) - -  ALT 0 - 44 U/L 10 - -     Pertinent Imaging today Plain films and CT images have been personally visualized and interpreted; radiology reports have been reviewed. Decision making incorporated into the Impression / Recommendations.  MRI L spine 06/09/21 IMPRESSION: 1. Ongoing changes of discitis-osteomyelitis at L1-2 and L2-3. No significant interval progression in the degree of endplate destruction compared to previous MRI. 2. Enhancing ventral epidural collection at the L1 and L2 levels is similar in appearance to the previous MRI and measures up to 5 mm in thickness. No evidence of progressive epidural fluid collection. 3. Interval posterior decompression at L1-2 and L2-3 with mild-to-moderate residual canal stenosis at L2-3 and moderate residual canal stenosis at L1-2, both improved from prior. 4. 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   I spent more than 35 minutes for this patient encounter including review of prior medical records, coordination of care  with greater than 50% of time being face to face/counseling and discussing diagnostics/treatment plan with the patient/family.  Electronically signed by:   Odette Fraction, MD Infectious Disease Physician [facility] for Infectious Disease Pager: [phone]

## 2021-06-11 NOTE — Progress Notes (Signed)
Physical Therapy Treatment Patient Details Name: John Ware MRN: [id number] DOB: [birth year] Today's Date: 06/11/2021   History of Present Illness Patient is a 55 y/o male who presents on 05/28/21 with LLE weakness and pain. 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 S/p lumbar laminotomies and decompression L1-2 and L2-3 on the left decompression of the common dural tube and nerve roots of L1 and L2 and L3 on 06/05/21. PMH includes HTN, chronic pain, DM, COVID, MSSA bacteremia, depression.    PT Comments    Patient progressing slowly towards PT goals. Improved ambulation distance today with John A to support left knee during stance due to instability/buckling. Requires more assist to stand from low surfaces with difficulty transitioning hand from surface to RW without assist for balance. Crying in pain reporting it is not controlled at this time but eager to get to rehab. Needs cues to adhere to back precautions during functional tasks. Will continue to follow and progress.   Recommendations for follow up therapy are one component of a multi-disciplinary discharge planning process, led by the attending physician.  Recommendations may be updated based on patient status, additional functional criteria and insurance authorization.  Follow Up Recommendations  CIR;Supervision for mobility/OOB     Equipment Recommendations  Rolling walker with 5" wheels;Wheelchair (measurements PT);Wheelchair cushion (measurements PT)    Recommendations for Other Services       Precautions / Restrictions Precautions Precautions: Fall;Back Precaution Booklet Issued: No Precaution Comments: Reviewed back precautions Restrictions Weight Bearing Restrictions: No     Mobility  Bed  Mobility Overal bed mobility: Needs Assistance Bed Mobility: Rolling;Sidelying to Sit Rolling: John assist Sidelying to sit: Mod assist;HOB elevated       General bed mobility comments: Assist to bring LLE to EOB and elevate trunk to get upright.    Transfers Overall transfer level: Needs assistance Equipment used: Rolling walker (2 wheeled) Transfers: Sit to/from [employer] Sit to Stand: Mod assist;John assist;From elevated surface Stand pivot transfers: John assist       General transfer comment: Assist to power to standing from EOB with John A (elevated height), mod A from low chair/BSC. Difficulty transitioning hand from bed to RW needing assist for balance. Stood from [employer], from chair x1, from [facility] x1. SPT chair to/from [facility].  Ambulation/Gait Ambulation/Gait assistance: John assist Gait Distance (Feet): 20 Feet Assistive device: Rolling walker (2 wheeled) Gait Pattern/deviations: Step-through pattern;Decreased stride length;Trunk flexed;Narrow base of support Gait velocity: decreased   General Gait Details: Slow, unsteady gait with left knee instability during stance phase needing support to prevent buckling; better able to advance LLE today, noted to drag it when fatigued towards end of gait.   Stairs             Wheelchair Mobility    Modified Rankin (Stroke Patients Only)       Balance Overall balance assessment: Needs assistance Sitting-balance support: Feet supported;Bilateral upper extremity supported Sitting balance-[name]: Fair Sitting balance - Comments: Leaning forward on legs to stretch out back reporting comfort and ease of pain;  prefers to have BUE support   Standing balance support: During functional activity Standing balance-[name]: Poor Standing balance comment: reliant on BUE support; total A for pericare.  Cognition Arousal/Alertness: Awake/alert Behavior During Therapy: WFL  for tasks assessed/performed Overall Cognitive Status: Within Functional Limits for tasks assessed                                 General Comments: needs cues to refrain from bending forward once sitting upright; reports it feels better      Exercises      General Comments General comments (skin integrity, edema, etc.): wife present at end of session.      Pertinent Vitals/Pain Pain Assessment: Faces Faces Pain Scale: Hurts whole lot Pain Location: back, hips Pain Descriptors / Indicators: Constant;Discomfort;Sore;Guarding;Crying Pain Intervention(s): Monitored during session;Premedicated before session;Repositioned;Limited activity within patient's tolerance    Home Living                      Prior Function            PT Goals (current goals can now be found in the care plan section) Progress towards PT goals: Progressing toward goals    Frequency    John 3X/week      PT Plan Current plan remains appropriate    Co-evaluation              AM-PAC PT "6 Clicks" Mobility   Outcome Measure  Help needed turning from your back to your side while in a flat bed without using bedrails?: A Little Help needed moving from lying on your back to sitting on the side of a flat bed without using bedrails?: A Lot Help needed moving to and from a bed to a chair (including a wheelchair)?: A Lot Help needed standing up from a chair using your arms (e.g., wheelchair or bedside chair)?: A Lot Help needed to walk in hospital room?: A Little Help needed climbing 3-5 steps with a railing? : A Lot 6 Click Score: 14    End of Session Equipment Utilized During Treatment: Gait belt Activity Tolerance: Patient tolerated treatment well;Patient limited by pain Patient left: in chair;with call bell/phone within reach;with family/visitor present Nurse Communication: Mobility status PT Visit Diagnosis: Pain;Muscle weakness (generalized) (M62.81) Pain - Right/Left:  Left Pain - part of body:  (hips/back)     Time: 2878-6767 PT Time Calculation (John) (ACUTE ONLY): 39 John  Charges:  $Gait Training: 8-22 mins $Therapeutic Activity: 23-37 mins                     Vale Haven, PT, DPT Acute Rehabilitation Services Pager [phone] Office [phone]      Blake Divine A Lanier Ensign 06/11/2021, 12:36 PM

## 2021-06-11 NOTE — Plan of Care (Addendum)
Pt. Informed about meplex to sacrum for preventative measures and informed about blood due to low hemoglobin levels. Pt. Ate entire breakfast and working towards [employer].  Able to give report to CIR, All pts belongings collected and transported to 4W. Pt tolerated transport well, no distress.

## 2021-06-12 DIAGNOSIS — M4646 Discitis, unspecified, lumbar region: Principal | ICD-10-CM

## 2021-06-12 LAB — COMPREHENSIVE METABOLIC PANEL
ALT: 10 U/L (ref 0–44)
AST: 11 U/L — ABNORMAL LOW (ref 15–41)
Albumin: 2.6 g/dL — ABNORMAL LOW (ref 3.5–5.0)
Alkaline Phosphatase: 80 U/L (ref 38–126)
Anion gap: 9 (ref 5–15)
BUN: 9 mg/dL (ref 6–20)
CO2: 27 mmol/L (ref 22–32)
Calcium: 9.2 mg/dL (ref 8.9–10.3)
Chloride: 98 mmol/L (ref 98–111)
Creatinine, Ser: 0.47 mg/dL — ABNORMAL LOW (ref 0.61–1.24)
GFR, Estimated: >60 mL/min (ref >60)
Glucose, Bld: 116 mg/dL — ABNORMAL HIGH (ref 70–99)
Potassium: 4 mmol/L (ref 3.5–5.1)
Sodium: 134 mmol/L — ABNORMAL LOW (ref 135–145)
Total Bilirubin: 0.3 mg/dL (ref 0.3–1.2)
Total Protein: 7.2 g/dL (ref 6.5–8.1)

## 2021-06-12 LAB — VANCOMYCIN, TROUGH: Vancomycin Tr: 10 ug/mL — ABNORMAL LOW (ref 15–20)

## 2021-06-12 LAB — CBC WITH DIFFERENTIAL/PLATELET
Abs Immature Granulocytes: 0.06 10*3/uL (ref 0.00–0.07)
Basophils Absolute: 0.1 10*3/uL (ref 0.0–0.1)
Basophils Relative: 1 %
Eosinophils Absolute: 0.2 10*3/uL (ref 0.0–0.5)
Eosinophils Relative: 2 %
HCT: 35.5 % — ABNORMAL LOW (ref 39.0–52.0)
Hemoglobin: 11.5 g/dL — ABNORMAL LOW (ref 13.0–17.0)
Immature Granulocytes: 1 %
Lymphocytes Relative: 25 %
Lymphs Abs: 2.9 10*3/uL (ref 0.7–4.0)
MCH: 26.2 pg (ref 26.0–34.0)
MCHC: 32.4 g/dL (ref 30.0–36.0)
MCV: 80.9 fL (ref 80.0–100.0)
Monocytes Absolute: 0.9 10*3/uL (ref 0.1–1.0)
Monocytes Relative: 8 %
Neutro Abs: 7.7 10*3/uL (ref 1.7–7.7)
Neutrophils Relative %: 63 %
Platelets: 570 10*3/uL — ABNORMAL HIGH (ref 150–400)
RBC: 4.39 MIL/uL (ref 4.22–5.81)
RDW: 15.7 % — ABNORMAL HIGH (ref 11.5–15.5)
WBC: 11.7 10*3/uL — ABNORMAL HIGH (ref 4.0–10.5)
nRBC: 0 % (ref 0.0–0.2)

## 2021-06-12 LAB — GLUCOSE, CAPILLARY
Glucose-Capillary: 124 mg/dL — ABNORMAL HIGH (ref 70–99)
Glucose-Capillary: 128 mg/dL — ABNORMAL HIGH (ref 70–99)
Glucose-Capillary: 247 mg/dL — ABNORMAL HIGH (ref 70–99)
Glucose-Capillary: 159 mg/dL — ABNORMAL HIGH (ref 70–99)

## 2021-06-12 MED ORDER — VANCOMYCIN HCL 1500 MG/300ML IV SOLN
1500.0000 mg | Freq: Two times a day (BID) | INTRAVENOUS | Status: DC
  Administered 2021-06-12 – 2021-06-13 (×2): 1500 mg via INTRAVENOUS
  Filled 2021-06-12 (×2): qty 300

## 2021-06-12 MED ORDER — OXYCODONE HCL 5 MG PO TABS
15.0000 mg | ORAL_TABLET | ORAL | Status: DC | PRN
  Administered 2021-06-12 – 2021-06-14 (×14): 15 mg via ORAL
  Filled 2021-06-12 (×14): qty 3

## 2021-06-12 MED ORDER — HYDROMORPHONE HCL ER 8 MG PO TB24
16.0000 mg | ORAL_TABLET | ORAL | Status: DC
  Administered 2021-06-12 – 2021-06-17 (×6): 16 mg via ORAL
  Filled 2021-06-12 (×6): qty 2

## 2021-06-12 MED ORDER — CHLORHEXIDINE GLUCONATE CLOTH 2 % EX PADS
6.0000 | MEDICATED_PAD | Freq: Every day | CUTANEOUS | Status: DC
  Administered 2021-06-12 – 2021-06-26 (×15): 6 via TOPICAL

## 2021-06-12 MED ORDER — ACETAMINOPHEN 325 MG PO TABS
650.0000 mg | ORAL_TABLET | Freq: Four times a day (QID) | ORAL | Status: DC
  Administered 2021-06-12 – 2021-06-21 (×37): 650 mg via ORAL
  Filled 2021-06-12 (×36): qty 2

## 2021-06-12 MED ORDER — KETOROLAC TROMETHAMINE 30 MG/ML IJ SOLN
30.0000 mg | Freq: Four times a day (QID) | INTRAMUSCULAR | Status: AC
  Administered 2021-06-12 – 2021-06-13 (×5): 30 mg via INTRAVENOUS
  Filled 2021-06-12 (×5): qty 1

## 2021-06-12 NOTE — Plan of Care (Signed)
  Problem: RH Balance Goal: LTG Patient will maintain dynamic standing with ADLs (OT) Description: LTG:  Patient will maintain dynamic standing balance with assist during activities of daily living (OT)  Flowsheets (Taken 06/12/2021 1258) LTG: Pt will maintain dynamic standing balance during ADLs with: Supervision/Verbal cueing   Problem: Sit to Stand Goal: LTG:  Patient will perform sit to stand in prep for activites of daily living with assistance level (OT) Description: LTG:  Patient will perform sit to stand in prep for activites of daily living with assistance level (OT) Flowsheets (Taken 06/12/2021 1258) LTG: PT will perform sit to stand in prep for activites of daily living with assistance level: Supervision/Verbal cueing   Problem: RH Grooming Goal: LTG Patient will perform grooming w/assist,cues/equip (OT) Description: LTG: Patient will perform grooming with assist, with/without cues using equipment (OT) Flowsheets (Taken 06/12/2021 1258) LTG: Pt will perform grooming with assistance level of: Set up assist    Problem: RH Bathing Goal: LTG Patient will bathe all body parts with assist levels (OT) Description: LTG: Patient will bathe all body parts with assist levels (OT) Flowsheets (Taken 06/12/2021 1258) LTG: Pt will perform bathing with assistance level/cueing: Minimal Assistance - Patient > 75%   Problem: RH Dressing Goal: LTG Patient will perform upper body dressing (OT) Description: LTG Patient will perform upper body dressing with assist, with/without cues (OT). Flowsheets (Taken 06/12/2021 1258) LTG: Pt will perform upper body dressing with assistance level of: Supervision/Verbal cueing Goal: LTG Patient will perform lower body dressing w/assist (OT) Description: LTG: Patient will perform lower body dressing with assist, with/without cues in positioning using equipment (OT) Flowsheets (Taken 06/12/2021 1258) LTG: Pt will perform lower body dressing with assistance  level of: Minimal Assistance - Patient > 75%   Problem: RH Toileting Goal: LTG Patient will perform toileting task (3/3 steps) with assistance level (OT) Description: LTG: Patient will perform toileting task (3/3 steps) with assistance level (OT)  Flowsheets (Taken 06/12/2021 1258) LTG: Pt will perform toileting task (3/3 steps) with assistance level: Minimal Assistance - Patient > 75%   Problem: RH Functional Use of Upper Extremity Goal: LTG Patient will use RT/LT upper extremity as a (OT) Description: LTG: Patient will use right/left upper extremity as a stabilizer/gross assist/diminished/nondominant/dominant level with assist, with/without cues during functional activity (OT) Flowsheets (Taken 06/12/2021 1258) LTG: Use of upper extremity in functional activities: RUE as dominant level LTG: Pt will use upper extremity in functional activity with assistance level of: Independent   Problem: RH Toilet Transfers Goal: LTG Patient will perform toilet transfers w/assist (OT) Description: LTG: Patient will perform toilet transfers with assist, with/without cues using equipment (OT) Flowsheets (Taken 06/12/2021 1258) LTG: Pt will perform toilet transfers with assistance level of: Supervision/Verbal cueing   Problem: RH Tub/Shower Transfers Goal: LTG Patient will perform tub/shower transfers w/assist (OT) Description: LTG: Patient will perform tub/shower transfers with assist, with/without cues using equipment (OT) Flowsheets (Taken 06/12/2021 1258) LTG: Pt will perform tub/shower stall transfers with assistance level of: Supervision/Verbal cueing

## 2021-06-12 NOTE — Patient Care Conference (Signed)
Inpatient RehabilitationTeam Conference and Plan of Care Update Date: 06/12/2021   Time: 11:53 AM   Patient Name: John Ware      Medical Record Number: [id number]  Date of Birth: 30-Dec-1965 Sex: Male         Room/Bed: 4W22C/4W22C-01 Payor Info: Payor: Advertising copywriter / Plan: [employer] PPO / Product Type: *No Product type* /    Admit Date/Time:  06/11/2021  3:38 PM  Primary Diagnosis:  <principal problem not specified>  Hospital Problems: Active Problems:   Septic discitis of lumbar region    Expected Discharge Date: Expected Discharge Date:  (14-16 days)  Team Members Present: Physician leading conference: Dr. Genice Rouge Social Worker Present: Dossie Der, LCSW Nurse Present: Kennyth Arnold, RN PT Present: Bernie Covey, PT OT Present: Earleen Newport, OT PPS Coordinator present : Fae Pippin, SLP     Current Status/Progress Goal Weekly Team Focus  Bowel/Bladder   continent b/b, lbm 10/17  remain continent  toilet as needed   Swallow/Nutrition/ Hydration             ADL's   sit > stand Mod, stand pivot Mod, LLE signficant weakness, hx of L knee buckling, Max/total LB ADL, flexed posture, pain significant  Supervision transfers, Min LB  pain management, OOB tolerance, standing balance/tolerance, ADL retraining, transfer training   Mobility   Eval pending  Eval pending  Eval pending   Communication             Safety/Cognition/ Behavioral Observations            Pain   7/10 pain reported  < 3  assess pain q 4hr and prn   Skin   back incision  no new breakdown  assess skin q shift and prn     Discharge Planning:  new evaluations plan home with wife who works from home and can provide supervision-light min assist level   Team Discussion: Pain not controlled. Changed medications, added IV Toradol, changed MS Contin to Exlago nightly. Continent B/B, LBM 10/17. Reports 7/10 pain. Back incision looks ok. Stage 1 to 2 locations on buttocks with foam  dressing. Going home with spouse. Uses Rolator.   Patient on target to meet rehab goals: yes, mod assist STS, stand pivot. Left leg fells extremely weak, didn't buckle. Can do ROM on shoulders. PT eval pending. Supervision goals possible.  *See Care Plan and progress notes for long and short-term goals.   Revisions to Treatment Plan:  Adjusting medications.  Teaching Needs: Family education, medication management, pain management, skin/wound care, safety awareness.  Current Barriers to Discharge: Decreased caregiver support, Home enviroment access/layout, IV antibiotics, Wound care, Weight, Weight bearing restrictions, and Medication compliance  Possible Resolutions to Barriers: Family education, monitor stage 1's on buttocks, monitor pain.     Medical Summary Current Status: pt reports pain 9-10/10- continent- LBM 10/13?- refused bowel meds- skin- 2 stage I spots on B/L buttocks; also incision looks OK  Barriers to Discharge: Decreased family/caregiver support;Home enviroment access/layout;Neurogenic Bowel & Bladder;Medical stability;Weight;Weight bearing restrictions;Wound care;IV antibiotics;Medication compliance;Behavior  Barriers to Discharge Comments: wife can do Supervision only; Possible Resolutions to [employer] Focus: changed MS Contin to Exlago 16 mg nightly- and increased Oxcodone to 15 mg q3 hours prn; LE's weak- pain significantly limiting; shoulders I&Ds; can do ROM; no PT eval yet- d/c- 14-16 days   Continued Need for Acute Rehabilitation Level of Care: The patient requires daily medical management by a physician with specialized training in physical medicine and rehabilitation for  the following reasons: Direction of a multidisciplinary physical rehabilitation program to maximize functional independence : Yes Medical management of patient stability for increased activity during participation in an intensive rehabilitation regime.: Yes Analysis of laboratory values  and/or radiology reports with any subsequent need for medication adjustment and/or medical intervention. : Yes   I attest that I was present, lead the team conference, and concur with the assessment and plan of the team.   Tennis Must 06/12/2021, 4:46 PM

## 2021-06-12 NOTE — Progress Notes (Signed)
Patient reporting 8 out of 10 pain all night and requesting pain medication every 3 hours on the dot. Patient not sleeping through the night due to pain. Patient is requesting PRN muscle relaxers to be added for break through pain in middle of the night.

## 2021-06-12 NOTE — Progress Notes (Signed)
Pharmacy Antibiotic Note  John Ware is a 55 y.o. male admitted on 06/11/2021 with bacteremia secondary to lumbar infection.  Pharmacy has been consulted for vancomycin dosing.  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 S/P Laminectomy and epidural debridement 10/11. Staph aureus from 10/3 blood cx is identified as MRSA now.   The patient has transferred into CIR - was receiving Vancomycin 1250 mg IV every 12 hours with planned levels for this evening and tomorrow morning.   Vanc peak 38, Vanc trough 10 Calculated AUC 364 on 1250mg  IV q12h  Plan: Increase Vancomcyin to 1500 mg IV q12h 12/11) Goal AUC 400-550 Will continue to follow renal function, culture results, LOT, and antibiotic de-escalation plans   Height: 5\' 10"  (177.8 cm) Weight: 72 kg (158 lb 11.7 oz) IBW/kg (Calculated) : 73  Temp (24hrs), Avg:98.6 F (37 C), Min:98.3 F (36.8 C), Max:99 F (37.2 C)  Recent Labs  Lab 06/06/21 0057 06/07/21 0330 06/11/21 0304 06/11/21 1917 06/12/21 0446  WBC 9.2 9.6 12.7*  --  11.7*  CREATININE 0.53* 0.55* 0.43*  --  0.47*  VANCOTROUGH  --   --   --   --  10*  VANCOPEAK  --   --   --  38  --      Estimated Creatinine Clearance: 108.8 mL/min (A) (by C-G formula based on SCr of 0.47 mg/dL (L)).    Allergies  Allergen Reactions   Tizanidine Other (See Comments)    Leg cramps    Antimicrobials this admission: Cefazolin  10/3 >> 10/14 Vancomycin 10/14 >>   Dose adjustments this admission: None  Microbiology results: 10/3 BCx: MRSA now (previously MSSA) 10/7 BCs: negative 10/11 Lumbar cx:  negative Thank you for allowing pharmacy to be a part of this patient's care.  11/14, PharmD, BCPS Please see amion for complete clinical pharmacist  phone list 06/12/2021 5:51 AM

## 2021-06-12 NOTE — Progress Notes (Signed)
Inpatient Rehabilitation Care Coordinator Assessment and Plan Patient Details  Name: John Ware MRN: [id number] Date of Birth: 11/08/1965  Today's Date: 06/12/2021  Hospital Problems: Active Problems:   Septic discitis of lumbar region  Past Medical History:  Past Medical History:  Diagnosis Date   Depression    Diabetes mellitus without complication (HCC)    Hypertension    Spinal stenosis    With Neurogenic Claudication   Past Surgical History:  Past Surgical History:  Procedure Laterality Date   CARPAL TUNNEL RELEASE Bilateral    INCISION AND DRAINAGE ABSCESS Left 03/12/2021   Procedure: INCISION AND DRAINAGE ABSCESS;  Surgeon: [name], MD;  Location: [facility];  Service: Orthopedics;  Laterality: Left;   IR US GUIDE BX ASP/DRAIN  02/02/2021   IRRIGATION AND DEBRIDEMENT ABSCESS Left 02/08/2021   Procedure: MINOR INCISION AND DRAINAGE OF ABSCESS;  Surgeon: [name], MD;  Location: [facility];  Service: Orthopedics;  Laterality: Left;   IRRIGATION AND DEBRIDEMENT ABSCESS Left 02/10/2021   Procedure: IRRIGATION AND DEBRIDEMENT ABSCESS LEFT LATERAL CHEST WALL;  Surgeon: [name], MD;  Location: [facility];  Service: General;  Laterality: Left;   IRRIGATION AND DEBRIDEMENT ABSCESS Bilateral 02/20/2021   Procedure: MINOR INCISION AND DRAINAGE OF ABSCESS/WITH BILATERAL WOUND VAC PLACEMENT;  Surgeon: [name], MD;  Location: [facility];  Service: Orthopedics;  Laterality: Bilateral;   IRRIGATION AND DEBRIDEMENT SHOULDER Bilateral 02/28/2021   Procedure: IRRIGATION AND DEBRIDEMENT SHOULDER;  Surgeon: [name], MD;  Location: [facility];  Service: Orthopedics;  Laterality: Bilateral;   IRRIGATION AND DEBRIDEMENT SHOULDER Left 03/06/2021   Procedure: IRRIGATION AND DEBRIDEMENT SHOULDER;  Surgeon: [name], MD;  Location: [facility];  Service: Orthopedics;  Laterality: Left;   IRRIGATION AND DEBRIDEMENT SHOULDER Left 03/08/2021   Procedure: IRRIGATION AND DEBRIDEMENT SHOULDER;   Surgeon: [name], MD;  Location: [facility];  Service: Orthopedics;  Laterality: Left;   LUMBAR LAMINECTOMY/DECOMPRESSION MICRODISCECTOMY N/A 12/13/2014   Procedure: LUMBAR THREE-FOUR, LUMBAR FOUR-FIVE, LUMBAR FIVE-SACRAL ONE LAMINECTOMY;  Surgeon: [name], MD;  Location: [facility];  Service: Neurosurgery;  Laterality: N/A;  L3-4 L4-5 L5-S1 Laminectomy   LUMBAR LAMINECTOMY/DECOMPRESSION MICRODISCECTOMY Left 06/05/2021   Procedure: Left lumbar one-two, Lumbar two-three Laminectomy;  Surgeon: [name], MD;  [facility];  Service: Neurosurgery;  Laterality: Left;   MENISCUS REPAIR     Right knee   MINOR APPLICATION OF WOUND VAC Left 02/20/2021   Procedure: MINOR APPLICATION OF KERLIX PACKING/REMOVAL OF WOUND VAC;  Surgeon: [name], MD;  Location: [facility];  Service: General;  Laterality: Left;   TEE WITHOUT CARDIOVERSION N/A 02/05/2021   Procedure: TRANSESOPHAGEAL ECHOCARDIOGRAM (TEE);  Surgeon: Quintella Reichert, MD;  [facility];  Service: Cardiovascular;  Laterality: N/A;   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 His smoking use included cigarettes. He has a 30.00 pack-year smoking history. He has never used smokeless tobacco. He reports current alcohol use. He reports that he does not use drugs.  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 Pt hopeful this will be it and he can recover now and have less pain  Social History Preferred language: English Religion: Primitive Baptist Cultural Background: No issues Education: HS Health Literacy - How often do you need to have someone help you when you read instructions, pamphlets, or other written material from your doctor or pharmacy?: Sometimes Writes: Yes Employment Status: Retired Date Retired/Disabled/Unemployed: 07/2020 Legal History/Current Legal Issues: No issues Guardian/Conservator: None-according to MD pt is capable of making his own decisions while here   Abuse/Neglect Abuse/Neglect Assessment Can Be Completed: Yes Physical Abuse: Denies Verbal Abuse: Denies Sexual Abuse: Denies Exploitation of patient/patient's resources: Denies Self-Neglect: Denies  Patient response to: Social Isolation - How often do you feel lonely or isolated from those around you?: Never  Emotional Status Pt's affect, behavior and adjustment status: Pt is motivated to do well but it is important he have his pain meds before therapy. He wants to do well here and at least get to walking with a walker like he was prior to admission. He is willing to do what he needs to do to improve Recent Psychosocial Issues: other health issues-long hospitalization in 01/31/2021-03/15/2021 Psychiatric History: History of depression takes medications for this and feels driven by his pain issues. Would benefit from seeing neuro-psych while here. Will get him on the list to be seen Substance Abuse History: No issues  Patient / Family Perceptions, Expectations & Goals Pt/Family understanding of illness & functional limitations: Pt is able to explain his back surgery and previous surgeries and was doing well until th pain got too bad. He has a good  relationship with surgeon and feels he has a good understanding of his treatment process going forward. Premorbid pt/family roles/activities: Husband, father, retiree, nieghbor, uncle, son etc Anticipated changes in roles/activities/participation: resume Pt/family expectations/goals: Pt states: " I am hopeful to do well and get moving with less pain when I leave here."  Wife states: " I hope he can get mobile on a walker before coming home."  [employer]: Other (Comment) (madison OPPT) Premorbid Home Care/DME Agencies: Other (Comment) [facility] for IV antibiotics in July, has rollator, tub seat) Transportation available at discharge: Wife pt drove prior to June Is the patient able to respond to transportation needs?: Yes In the past 12 months, has lack of transportation kept you from medical appointments or from getting medications?: No In the past 12 months, has lack of transportation kept you from meetings, work, or from getting things needed for daily living?: No Resource referrals recommended: Neuropsychology  Discharge Planning Living Arrangements: Spouse/significant other, Children Support Systems: Spouse/significant other, Children, Parent, Other relatives, Friends/neighbors Type of Residence: Private residence Insurance Resources: Media planner (specify) Horticulturist, commercial) Financial Resources: Family Support, Other (Comment) (pension from county) Financial Screen Referred: No Living Expenses: Own Money Management: Patient, Spouse Does the patient have any problems obtaining your medications?: No Home Management: wife Patient/Family Preliminary Plans: Return home with wife and daughter. Wife works from home and can assist-supervision-light min level. He was doing ok at home prior to admission but pain limited him and pushed him to have more surgery. Will await therapy evaluations and work on discharge needs. See he will again need IV antibiotics at home until  07/17/21 Care Coordinator Anticipated Follow Up Needs: HH/OP  Clinical Impression Pleasant gentleman who is willing to work in therapies as long as he has had his pain meds. This seems to limit him. He has been through much since June and hopes this will be his last surgery and can move forward with his recovery. Aware of need for IV antibiotics and follow up  Lucy Chris 06/12/2021, 10:37 AM

## 2021-06-12 NOTE — Progress Notes (Signed)
Inpatient Rehabilitation Center Individual Statement of Services  Patient Name:  John Ware  Date:  06/12/2021  Welcome to the Inpatient Rehabilitation Center.  Our goal is to provide you with an individualized program based on your diagnosis and situation, designed to meet your specific needs.  With this comprehensive rehabilitation program, you will be expected to participate in at least 3 hours of rehabilitation therapies Monday-Friday, with modified therapy programming on the weekends.  Your rehabilitation program will include the following services:  Physical Therapy (PT), Occupational Therapy (OT), 24 hour per day rehabilitation nursing, Therapeutic Recreaction (TR), Neuropsychology, Care Coordinator, Rehabilitation Medicine, Nutrition Services, and Pharmacy Services  Weekly team conferences will be held on Tuesday to discuss your progress.  Your Inpatient Rehabilitation Care Coordinator will talk with you frequently to get your input and to update you on team discussions.  Team conferences with you and your family in attendance may also be held.  Expected length of stay: 14-16 days  Overall anticipated outcome: supervision-min assist level  Depending on your progress and recovery, your program may change. Your Inpatient Rehabilitation Care Coordinator will coordinate services and will keep you informed of any changes. Your Inpatient Rehabilitation Care Coordinator's name and contact numbers are listed  below.  The following services may also be recommended but are not provided by the Inpatient Rehabilitation Center:  Driving Evaluations Home Health Rehabiltiation Services Outpatient Rehabilitation Services    Arrangements will be made to provide these services after discharge if needed.  Arrangements include referral to agencies that provide these services.  Your insurance has been verified to be:  [facility] Your primary doctor is:  Medical sales representative  Pertinent information will be  shared with your doctor and your insurance company.  Inpatient Rehabilitation Care Coordinator:  Dossie Der, [place] [phone] or Luna [place]  Information discussed with and copy given to patient by: Lucy Chris, 06/12/2021, 10:39 AM

## 2021-06-12 NOTE — H&P (Addendum)
Physical Medicine and Rehabilitation Admission H&P        Chief Complaint  Patient presents with   Functional deficits due to Lumbar diskitis with LLE radiculopathy.       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 He was admitted on 05/28/21 with back pain and LLE weakness X one week and intermittent fevers. No bowel or bladder complaints. He was started on Cefazolin and MRI spine done revealing found to have ongoing diskitis with osteomyelitis at L1/L2 and L2/L3 with new/increased ventral epidural phlegmon centered at L1 with severe canal stenosis and compression of cauda equina nerve roots, phlegmon also seen along ventral aspect of L1 and L2 vertebral bodies, no significant change in fluid collection in soft tissues L4/L5 favored to be seroma and overall interval decrease in  size and number of bilateral psoas abscessed with small residual abscess and diffuse signal abnormality throughout the remainder of psoas and paraspinal musculature consistent with infectious/inflammatory myositis.     [name] consulted and recommended monitoring for neurological recovery/improvement. Patient continued to have weakness and pain therefore elected on undergoing decompression of left L1/2 and L2/3 dural tube and nerve roots of L1-L3 by [name] on 10/11.  No pus encountered and tissue sent for cultures. Post op has not had improvement in LLE motor recovery, had issues with low BP, acute blood loss anemia, labile BS as well as ongoing issues with back pain. MRI L  spine repeated 10/15 revealing enhancing ventral collection at L1 and L2 similar in appearance without progression and interval decompression of L1/2 and L2/3. [name] has reviewed films--stenosis better and plans to follow up in a month on outpatient basis.  MS contin added today to help with pain control.  Repeat blood cultures negative, rifampin added on 10/14 and Cefazolin changed to Vancomycin on 10/15 per [name] recommendations. Continues to have low grade fevers in evening and WBC on upward trend. Patient continues to have LLE weakness with instability , balance deficits and pain affecting ADLs and mobility. CIR recommended due to functional decline.        Review of Systems  Constitutional:  Positive for chills. Negative for fever.  HENT:  Negative for hearing loss and sinus pain.   Eyes:  Negative for blurred vision and double vision.  Respiratory:  Negative for cough and shortness of breath.   Cardiovascular:  Negative for chest pain and palpitations.  Gastrointestinal:  Positive for abdominal pain (cramps). Negative for constipation, heartburn, nausea and vomiting.  Genitourinary:  Negative for dysuria and urgency.  Musculoskeletal:  Positive for back pain and joint pain (bilateral hips and across pelvis.).  Skin:  Negative for rash.  Neurological:  Positive for sensory change (both feet at numb/painful at nights.) and weakness (LLE paresis).  Psychiatric/Behavioral:  The patient has insomnia (due to pain--constant around the clock.).           Past Medical History:  Diagnosis Date   Depression     Diabetes mellitus without complication (HCC)     Hypertension     Spinal stenosis  With Neurogenic Claudication           Past Surgical History:  Procedure Laterality Date   CARPAL TUNNEL RELEASE Bilateral     INCISION AND DRAINAGE ABSCESS Left 03/12/2021    Procedure: INCISION AND DRAINAGE ABSCESS;  Surgeon: [name], MD;  Location: [facility];  Service:  Orthopedics;  Laterality: Left;   IR US GUIDE BX ASP/DRAIN   02/02/2021   IRRIGATION AND DEBRIDEMENT ABSCESS Left 02/08/2021    Procedure: MINOR INCISION AND DRAINAGE OF ABSCESS;  Surgeon: [name], MD;  Location: [facility];  Service: Orthopedics;  Laterality: Left;   IRRIGATION AND DEBRIDEMENT ABSCESS Left 02/10/2021    Procedure: IRRIGATION AND DEBRIDEMENT ABSCESS LEFT LATERAL CHEST WALL;  Surgeon: [name], MD;  Location: [facility];  Service: General;  Laterality: Left;   IRRIGATION AND DEBRIDEMENT ABSCESS Bilateral 02/20/2021    Procedure: MINOR INCISION AND DRAINAGE OF ABSCESS/WITH BILATERAL WOUND VAC PLACEMENT;  Surgeon: [name], MD;  Location: [facility];  Service: Orthopedics;  Laterality: Bilateral;   IRRIGATION AND DEBRIDEMENT SHOULDER Bilateral 02/28/2021    Procedure: IRRIGATION AND DEBRIDEMENT SHOULDER;  Surgeon: [name], MD;  Location: [facility];  Service: Orthopedics;  Laterality: Bilateral;   IRRIGATION AND DEBRIDEMENT SHOULDER Left 03/06/2021    Procedure: IRRIGATION AND DEBRIDEMENT SHOULDER;  Surgeon: [name], MD;  Location: [facility];  Service: Orthopedics;  Laterality: Left;   IRRIGATION AND DEBRIDEMENT SHOULDER Left 03/08/2021    Procedure: IRRIGATION AND DEBRIDEMENT SHOULDER;  Surgeon: [name], MD;  Location: [facility];  Service: Orthopedics;  Laterality: Left;   LUMBAR LAMINECTOMY/DECOMPRESSION MICRODISCECTOMY N/A 12/13/2014    Procedure: LUMBAR THREE-FOUR, LUMBAR FOUR-FIVE, LUMBAR FIVE-SACRAL ONE LAMINECTOMY;  Surgeon: [name], MD;  Location: [facility];  Service: Neurosurgery;  Laterality: N/A;  L3-4 L4-5 L5-S1 Laminectomy   LUMBAR LAMINECTOMY/DECOMPRESSION MICRODISCECTOMY Left 06/05/2021    Procedure: Left lumbar one-two, Lumbar two-three Laminectomy;  Surgeon: [name], MD;  Location: [facility];  Service: Neurosurgery;  Laterality: Left;   MENISCUS REPAIR        Right knee   MINOR APPLICATION OF WOUND VAC Left 02/20/2021    Procedure: MINOR  APPLICATION OF KERLIX PACKING/REMOVAL OF WOUND VAC;  Surgeon: [name], MD;  Location: [facility];  Service: General;  Laterality: Left;   TEE WITHOUT CARDIOVERSION N/A 02/05/2021    Procedure: TRANSESOPHAGEAL ECHOCARDIOGRAM (TEE);  Surgeon: Quintella Reichert, MD;  Location: [facility];  Service: Cardiovascular;  Laterality: N/A;   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 Used to operate heavy equipment--retired in 07/2020. He was active prior to June. He  reports that he quit smoking about 10 years ago. His smoking use included cigarettes. He has a 30.00 pack-year smoking history. He has never used smokeless tobacco. He used to drink 3-4 mixed drinks--has not used any since infection. He reports that he does not use drugs.          Allergies  Allergen Reactions   Tizanidine  Other (See Comments)      Leg cramps            Medications Prior to Admission  Medication Sig Dispense Refill   acetaminophen (TYLENOL) 500 MG tablet Take 1 tablet (500 mg total) by mouth 4 (four) times daily. (Patient taking differently: Take 1,000 mg by mouth 3 (three) times daily as needed (pain).) 30 tablet 0   cephALEXin (KEFLEX) 500 MG capsule Take 1 capsule (500 mg total) by mouth 3 (three) times daily. 90 capsule 1   gabapentin (NEURONTIN) 800 MG tablet TAKE (1) TABLET THREE TIMES DAILY. (Patient taking differently: Take 800 mg by mouth at bedtime as needed (nerve pain/foot pain).) 90 tablet 2   magic mouthwash SOLN Take 5 mLs by mouth 4 (four) times daily. (Patient taking differently: Take 5 mLs by mouth daily as needed for mouth pain.  Swish and swallow) 240 mL 0   metFORMIN (GLUCOPHAGE-XR) 750 MG 24 hr tablet Take 1,500 mg by mouth daily with breakfast.       methocarbamol (ROBAXIN) 500 MG tablet Take 1 tablet (500 mg total) by mouth 2 (two) times daily as needed for muscle spasms. 60 tablet 2   morphine (MS CONTIN) 30 MG 12 hr tablet Take 30 mg by mouth 2 (two) times daily.       naproxen sodium (ALEVE) 220 MG tablet Take 440 mg by mouth daily as needed (pain).       oxyCODONE-acetaminophen (PERCOCET) 10-325 MG tablet Take 1 tablet by mouth 3 (three) times daily as needed (breakthrough pain).       ascorbic acid (VITAMIN C) 500 MG tablet Take 1 tablet (500 mg total) by mouth 2 (two) times daily. (Patient not taking: No sig reported)       buPROPion (WELLBUTRIN XL) 150 MG 24 hr tablet Take 1 tablet (150 mg total) by mouth 2 (two) times daily. (Patient not taking: No sig reported)       Continuous Blood Gluc Sensor (FREESTYLE LIBRE 2 SENSOR) MISC Needs to check BS QID 1 each 11   ferrous sulfate 325 (65 FE) MG tablet Take 1 tablet (325 mg total) by mouth 2 (two) times daily with a meal. (Patient not taking: No sig reported)   3      Drug Regimen Review  Drug regimen was reviewed and remains appropriate with no significant issues identified   Home: Home Living Family/patient expects to be discharged to:: Private residence Living Arrangements: Spouse/significant other Available Help at Discharge: Family Type of Home: House Home Access: Level entry Home Layout: One level Bathroom Shower/Tub: Engineer, manufacturing systems: Handicapped height Bathroom Accessibility: Yes Home Equipment: Information systems manager, Medical laboratory scientific officer - single point, Environmental consultant - 4 wheels Additional Comments: pt unreliable historian and no family present. Pt reports prior to fall on 5/28 he was independent.   Functional History: Prior Function Level of Independence: Independent Comments: ADLs, IADLs, retired. Using cane and RW once the pain in LLE  and bil hips started    Functional Status:  Mobility: Bed Mobility Overal bed mobility: Needs Assistance Bed Mobility: Rolling, Sidelying to Sit Rolling: Min assist Sidelying to sit: Mod assist, HOB elevated Sit to sidelying: Min assist, HOB elevated General bed mobility comments: Assist to bring LLE to EOB and elevate trunk to get upright. Transfers Overall transfer level: Needs assistance Equipment used: Rolling walker (2 wheeled) Transfer via Lift Equipment: Stedy Transfers: Sit to/from Stand, [employer] Transfers Sit to Stand: Mod assist, Min assist, From elevated surface Stand pivot  transfers: Min assist General transfer comment: Assist to power to standing from EOB with Min A (elevated height), mod A from low chair/BSC. Difficulty transitioning hand from bed to RW needing assist for balance. Stood from [employer], from chair x1, from [facility] x1. SPT chair to/from [facility]. Ambulation/Gait Ambulation/Gait assistance: Min assist Gait Distance (Feet): 20 Feet Assistive device: Rolling walker (2 wheeled) Gait Pattern/deviations: Step-through pattern, Decreased stride length, Trunk flexed, Narrow base of support General Gait Details: Slow, unsteady gait with left knee instability during stance phase needing support to prevent buckling; better able to advance LLE today, noted to drag it when fatigued towards end of gait. Gait velocity: decreased Gait velocity interpretation: <1.31 ft/sec, indicative of household ambulator   ADL: ADL Overall ADL's : Needs assistance/impaired Eating/Feeding: Set up, Sitting Grooming: Set up, Sitting Upper Body Bathing: Supervision/ safety, Set up, Sitting Lower Body Bathing: Moderate assistance, Sitting/lateral leans, Sit to/from stand Upper Body Dressing : Supervision/safety, Set up Upper Body Dressing Details (indicate cue type and reason): Limited ROM due to prior shoudler sx Lower Body Dressing: Moderate assistance, Sitting/lateral leans Lower Body Dressing Details (indicate cue  type and reason): Mod A for LB dressing due to limited ROM with pain Toilet Transfer: Moderate assistance, Stand-pivot, BSC, RW Toilet Transfer Details (indicate cue type and reason): Pt reports that he feels weak after having worked with PT earlier today, resulting in increased difficulty with stand-pivot transfer to [facility] from recliner. Functional mobility during ADLs: Min guard, Rolling walker General ADL Comments: Pt continues to be limited by pain, weakness, and activity tolerance.   Cognition: Cognition Overall Cognitive Status: Within Functional Limits for tasks assessed Orientation Level: Oriented X4 Cognition Arousal/Alertness: Awake/alert Behavior During Therapy: WFL for tasks assessed/performed Overall Cognitive Status: Within Functional Limits for tasks assessed General Comments: needs cues to refrain from bending forward once sitting upright; reports it feels better     Blood pressure 117/84, pulse 65, temperature 98.3 F (36.8 C), temperature source Oral, resp. rate 18, height 5\' 10"  (1.778 m), weight 77.1 kg, SpO2 97 %. Physical Exam Vitals and nursing note reviewed.  Constitutional:      Appearance: Normal appearance.  Skin:    Comments: Prior bilateral shoulder and left scapula incisions well healed. Lumbar incision C/D/I without erythema.   Neurological:     Mental Status: He is alert and oriented to person, place, and time.     Comments: Speech clear.  LLE weakness.       General: No acute distress Mood and affect are appropriate Heart: Regular rate and rhythm no rubs murmurs or extra sounds Lungs: Clear to auscultation, breathing unlabored, no rales or wheezes Abdomen: Positive bowel sounds, soft nontender to palpation, nondistended Extremities: No clubbing, cyanosis, or edema  Neurologic: Cranial nerves II through XII intact, motor strength is 5/5 in bilateral deltoid, bicep, tricep, grip, 4/5 Right and trace left hip flexor, knee extensors, ankle dorsiflexor  and plantar flexor Sensory exam normal sensation to light touch in bilateral upper and lower extremities  Musculoskeletal: Full range of motion in upper and Right lower extremities. No joint swelling, LLE with limited AROM   Lab Results Last 48 Hours        Results for orders placed or performed during the hospital encounter of 05/28/21 (from the past 48 hour(s))  Glucose, capillary     Status: Abnormal    Collection Time: 06/09/21  4:57 PM  Result Value Ref Range    Glucose-Capillary 169 (H) 70 - 99 mg/dL  Comment: Glucose reference range applies only to samples taken after fasting for at least 8 hours.  Glucose, capillary     Status: Abnormal    Collection Time: 06/10/21  6:25 AM  Result Value Ref Range    Glucose-Capillary 141 (H) 70 - 99 mg/dL      Comment: Glucose reference range applies only to samples taken after fasting for at least 8 hours.  Glucose, capillary     Status: None    Collection Time: 06/10/21 11:57 AM  Result Value Ref Range    Glucose-Capillary 99 70 - 99 mg/dL      Comment: Glucose reference range applies only to samples taken after fasting for at least 8 hours.  Glucose, capillary     Status: Abnormal    Collection Time: 06/10/21  3:38 PM  Result Value Ref Range    Glucose-Capillary 124 (H) 70 - 99 mg/dL      Comment: Glucose reference range applies only to samples taken after fasting for at least 8 hours.  Glucose, capillary     Status: None    Collection Time: 06/10/21  8:20 PM  Result Value Ref Range    Glucose-Capillary 95 70 - 99 mg/dL      Comment: Glucose reference range applies only to samples taken after fasting for at least 8 hours.  CBC with Differential/Platelet     Status: Abnormal    Collection Time: 06/11/21  3:04 AM  Result Value Ref Range    WBC 12.7 (H) 4.0 - 10.5 K/uL    RBC 4.33 4.22 - 5.81 MIL/uL    Hemoglobin 11.3 (L) 13.0 - 17.0 g/dL    HCT 40.9 (L) 81.1 - 52.0 %    MCV 82.2 80.0 - 100.0 fL    MCH 26.1 26.0 - 34.0 pg     MCHC 31.7 30.0 - 36.0 g/dL    RDW 91.4 (H) 78.2 - 15.5 %    Platelets 573 (H) 150 - 400 K/uL    nRBC 0.0 0.0 - 0.2 %    Neutrophils Relative % 68 %    Neutro Abs 8.7 (H) 1.7 - 7.7 K/uL    Lymphocytes Relative 21 %    Lymphs Abs 2.6 0.7 - 4.0 K/uL    Monocytes Relative 8 %    Monocytes Absolute 1.0 0.1 - 1.0 K/uL    Eosinophils Relative 2 %    Eosinophils Absolute 0.2 0.0 - 0.5 K/uL    Basophils Relative 1 %    Basophils Absolute 0.1 0.0 - 0.1 K/uL    Immature Granulocytes 0 %    Abs Immature Granulocytes 0.05 0.00 - 0.07 K/uL      Comment: Performed at [facility], [address] [place], [place] [address]  Comprehensive metabolic panel     Status: Abnormal    Collection Time: 06/11/21  3:04 AM  Result Value Ref Range    Sodium 133 (L) 135 - 145 mmol/L    Potassium 4.0 3.5 - 5.1 mmol/L    Chloride 98 98 - 111 mmol/L    CO2 27 22 - 32 mmol/L    Glucose, Bld 123 (H) 70 - 99 mg/dL      Comment: Glucose reference range applies only to samples taken after fasting for at least 8 hours.    BUN 8 6 - 20 mg/dL    Creatinine, Ser 3.08 (L) 0.61 - 1.24 mg/dL    Calcium 9.0 8.9 - 65.7 mg/dL    Total Protein  6.9 6.5 - 8.1 g/dL    Albumin 2.5 (L) 3.5 - 5.0 g/dL    AST 12 (L) 15 - 41 U/L    ALT 10 0 - 44 U/L    Alkaline Phosphatase 92 38 - 126 U/L    Total Bilirubin 0.6 0.3 - 1.2 mg/dL    GFR, Estimated >95 >63 mL/min      Comment: (NOTE) Calculated using the CKD-EPI Creatinine Equation (2021)      Anion gap 8 5 - 15      Comment: Performed at [facility], [address] [place], [place] [address]  Glucose, capillary     Status: Abnormal    Collection Time: 06/11/21  7:39 AM  Result Value Ref Range    Glucose-Capillary 121 (H) 70 - 99 mg/dL      Comment: Glucose reference range applies only to samples taken after fasting for at least 8 hours.  Glucose, capillary     Status: Abnormal    Collection Time: 06/11/21 11:54 AM  Result Value Ref Range    Glucose-Capillary 205  (H) 70 - 99 mg/dL      Comment: Glucose reference range applies only to samples taken after fasting for at least 8 hours.       Imaging Results (Last 48 hours)  MR Lumbar Spine W Wo Contrast   Result Date: 06/09/2021 CLINICAL DATA:  Discitis osteomyelitis with epidural abscess. Interval lumbar decompression at L1-2 and L2-3 on 06/05/2021 EXAM: MRI LUMBAR SPINE WITHOUT AND WITH CONTRAST TECHNIQUE: Multiplanar and multiecho pulse sequences of the lumbar spine were obtained without and with intravenous contrast. CONTRAST:  7.24mL GADAVIST GADOBUTROL 1 MMOL/ML IV SOLN COMPARISON:  MRI 05/28/2021 FINDINGS: Segmentation:  Standard. Alignment:  Unchanged grade 1 anterolisthesis of L5 on S1. Vertebrae: Postsurgical changes of L3-S1 posterior and interbody fusion. Ongoing changes of discitis osteomyelitis at L1-2 and L2-3. Low T1 bone marrow signal throughout the L1, L2, and L3 vertebral bodies with associated bone marrow edema and enhancement are compatible with acute osteomyelitis. No significant interval progression in the degree of endplate destruction compared to previous MRI. Enhancing ventral epidural collection at the L1 and L2 levels is similar in appearance to the previous MRI and measures up to 5 mm in thickness. No evidence of progressive epidural fluid collection. Interval decompressive laminotomies on the left at L1-2 and L2-3. Conus medullaris and cauda equina: Conus extends to the L1 level. Conus and cauda equina appear normal. Paraspinal and other soft tissues: Extensive T2 hyperintense signal throughout the posterior paraspinal musculature and within the bilateral psoas muscles. Elongated fluid collection within the right psoas muscle does not appear increased in size compared to the prior study. Small collection within the left psoas muscle appears similar to slightly decreased in size compared to the previous study. Similar degree of prevertebral inflammatory changes without well-defined fluid  collection. Stable size and appearance of presumed postoperative seroma posterior to the L4 and L5 vertebral bodies. Disc levels: T12-L1: Right paracentral disc protrusion. No high-grade stenosis. Unchanged. L1-L2: Interval posterior decompression. Moderate residual canal stenosis at this level, improved from prior. Persistent high-grade bilateral foraminal stenosis. L2-L3: Interval posterior decompression. Mild-to-moderate residual canal stenosis, improved from prior. Persistent high-grade bilateral foraminal stenosis. L3-L4: Prior fusion and posterior decompression. No evidence of residual stenosis. Unchanged. L4-L5: Prior fusion and posterior decompression. No evidence of residual stenosis. Unchanged. L5-S1: Anterolisthesis. Prior fusion and posterior decompression. No residual canal stenosis. Similar degree of bilateral foraminal stenosis. Unchanged. IMPRESSION: 1. Ongoing changes  of discitis-osteomyelitis at L1-2 and L2-3. No significant interval progression in the degree of endplate destruction compared to previous MRI. 2. Enhancing ventral epidural collection at the L1 and L2 levels is similar in appearance to the previous MRI and measures up to 5 mm in thickness. No evidence of progressive epidural fluid collection. 3. Interval posterior decompression at L1-2 and L2-3 with mild-to-moderate residual canal stenosis at L2-3 and moderate residual canal stenosis at L1-2, both improved from prior. 4. 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 Electronically Signed   By: Duanne Guess D.O.   On: 06/09/2021 15:06             Medical Problem List and Plan: 1.  Decline in mobility and ADLs secondary to LLE radiculopathy resulting in septic lumbar discitis             -patient may  shower             -ELOS/Goals: 14-18d 2.  Antithrombotics: -DVT/anticoagulation:  Mechanical: Sequential compression devices, below knee Bilateral lower  extremities             -antiplatelet therapy: N/A 3. Pain Management: Back pain almost resolved but continues to have stabbing/burning bilateral hip/pelvic pain.  Continue robaxin 1000 mg TID.              --MS contin was added 10/17 in addition to prn oxycodone             --Will change home prn gabapentin 800 mg/bedtime for sleep and add gabapentin 100 mg bid during the day for neuropathic pain.              --Cymbalta? For mood and pain.  4. Mood: LCSW to follow for evaluation and support. Team to provide ego support.              -antipsychotic agents: N/a 5. Neuropsych: This patient is capable of making decisions on his own behalf. 6. Skin/Wound Care: Routine pressure relief measures.  7. Fluids/Electrolytes/Nutrition: Monitor I/O. Check lytes in am.  8. 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 --Antibiotics to continue for 6 weeks? -lumbar wound cultures without growth.  9. Low grade fevers: T-max 99.2 noted at nights and managed with prn tylenol.   10. T2DM with peripheral neuropathy/ Hgb A1c-8.4  (was 10.2  01/2021). Will monitor BS ac/hs  --ON insulin glargine  8 units and metformin daily.             --Use SSI for elevated BS/tighter control to help promote wound healing.  11. Episodic Hypotension: Has resolved. Continue to monitor BP TID.  12. Hyponatremia: Monitor as continue to vary from 130-133 13. Acute blood loss anemia: Monitor H/H for trend/recovery. 14. Leucocytosis: Monitor for fevers and other signs of worsening of infection             --monitor wounds for healing. Cleared by Neurosurgery and hospitalist for rehab transfer  15. Low protein stores:Continue prosource BID. Ensure max once daily.              --will add vitamins/Zinc also.      Jacquelynn Cree, PA-C 06/11/2021  "I have personally performed a face to face diagnostic evaluation of this patient.  Additionally, I have reviewed and concur with the physician  assistant's documentation above." Erick Colace M.D. [facility] Fellow [employer] of [employer] Med and Rehab Diplomate Am Board of  Electrodiagnostic Med Fellow Am Board of Interventional Pain

## 2021-06-12 NOTE — Progress Notes (Signed)
Inpatient Rehabilitation  Patient information reviewed and entered into eRehab system by Sebert Stollings M. Merl Guardino, M.A., CCC/SLP, PPS Coordinator.  Information including medical coding, functional ability and quality indicators will be reviewed and updated through discharge.    

## 2021-06-12 NOTE — Progress Notes (Signed)
  Spoke with pt- pain is NOT well controlled- was on MS contin 30 mg BID for "20 years' and Percocet 10/325 QID at home prior to discitis   Plan: Spoke to Dr Phillips Odor- will change MS Contin to Exalgo 16 mg at 8pm daily and change Oxycodone /percocet to 15 mg q3 hours prn - can increase both to 32 mg and 20 mg q3 hours if needed tomorrow; will also add Toradol 30 mg IV q6 hours x 5 doses and add Tylenol 650 mg q6 hours so he gets tylenol as well.   2. Team conference today to figure out how long he might be here- not participating well with therapy due to pain.

## 2021-06-12 NOTE — Progress Notes (Signed)
Occupational Therapy Session Note  Patient Details  Name: John Ware MRN: [id number] Date of Birth: 1966/03/03  Today's Date: 06/12/2021 OT Individual Time: 6770-3403 OT Individual Time Calculation (min): 40 min    Short Term Goals: Week 1:  OT Short Term Goal 1 (Week 1): Pt will perform stand pivot to BSC/toilet with LRAD Min A OT Short Term Goal 2 (Week 1): Pt will perform LB dress with Mod A and AE PRN OT Short Term Goal 3 (Week 1): Pt will perform sit <> stands in prep for ADL with Min A at LRAD OT Short Term Goal 4 (Week 1): Pt will increase AROM for shoulder flexion to 120* in B shoulders  Skilled Therapeutic Interventions/Progress Updates:  Pt greeted at time of session sitting up in recliner with wife present who remained throughout session. Pt with 4/10 hip pain which is much improved from AM session. Stand pivot recliner > w/c with Mod A and RW. Difficulty with hand placement when coming up to standing. Wheelchair transport throughout [employer] for tour and improve comfort with doing therapy out of room in future sessions. In ortho gym, SCIFIT on level 1 for 6 mins total, switching directions with rest break half way through for BUE shoulder ROM. Dynamic standing at [facility] for LLE toe taps for 10/10 hits on various colored dots with difficulty lifting foot to bring back under self. Back in room, stand pivot w/c > bed Mod A, sit > supine Mod A as well. Alarm on call bell in reach and IV plugged into wall.   Therapy Documentation Precautions:  Precautions Precautions: Fall, Back Precaution Comments: recalled 3/3 precautions Restrictions Weight Bearing Restrictions: No     Therapy/Group: Individual Therapy  Erasmo Score 06/12/2021, 12:57 PM

## 2021-06-12 NOTE — Evaluation (Signed)
Occupational Therapy Assessment and Plan  Patient Details  Name: John Ware MRN: [id number] Date of Birth: [birth year]  OT Diagnosis: abnormal posture, acute pain, lumbago (low back pain), and muscle weakness (generalized) Rehab Potential:   ELOS: 14-16 days   Today's Date: 06/12/2021 OT Individual Time: 1749-4496 OT Individual Time Calculation (min): 63 min  and Today's Date: 06/12/2021 OT Missed Time: 12 Minutes Missed Time Reason: Other (comment) (previous pt care)    Hospital Problem: Active Problems:   Septic discitis of lumbar region   Past Medical History:  Past Medical History:  Diagnosis Date   Depression    Diabetes mellitus without complication ([place])    Hypertension    Spinal stenosis    With Neurogenic Claudication   Past Surgical History:  Past Surgical History:  Procedure Laterality Date   CARPAL TUNNEL RELEASE Bilateral    INCISION AND DRAINAGE ABSCESS Left 03/12/2021   Procedure: INCISION AND DRAINAGE ABSCESS;  Surgeon: Marchia Bond, MD;  Location: [facility];  Service: Orthopedics;  Laterality: Left;   IR US GUIDE BX ASP/DRAIN  02/02/2021   IRRIGATION AND DEBRIDEMENT ABSCESS Left 02/08/2021   Procedure: MINOR INCISION AND DRAINAGE OF ABSCESS;  Surgeon: Marchia Bond, MD;  Location: [facility];  Service: Orthopedics;  Laterality: Left;   IRRIGATION AND DEBRIDEMENT ABSCESS Left 02/10/2021   Procedure: IRRIGATION AND DEBRIDEMENT ABSCESS LEFT LATERAL CHEST WALL;  Surgeon: Greer Pickerel, MD;  Location: [facility];  Service: General;  Laterality: Left;   IRRIGATION AND DEBRIDEMENT ABSCESS Bilateral 02/20/2021   Procedure: MINOR INCISION AND DRAINAGE OF ABSCESS/WITH BILATERAL WOUND VAC PLACEMENT;  Surgeon: Marchia Bond, MD;  Location: [facility];  Service: Orthopedics;  Laterality: Bilateral;   IRRIGATION AND DEBRIDEMENT SHOULDER Bilateral 02/28/2021   Procedure: IRRIGATION AND DEBRIDEMENT SHOULDER;  Surgeon: Hiram Gash, MD;  Location: [facility];  Service: Orthopedics;   Laterality: Bilateral;   IRRIGATION AND DEBRIDEMENT SHOULDER Left 03/06/2021   Procedure: IRRIGATION AND DEBRIDEMENT SHOULDER;  Surgeon: Renette Butters, MD;  Location: [facility];  Service: Orthopedics;  Laterality: Left;   IRRIGATION AND DEBRIDEMENT SHOULDER Left 03/08/2021   Procedure: IRRIGATION AND DEBRIDEMENT SHOULDER;  Surgeon: Marchia Bond, MD;  Location: [facility];  Service: Orthopedics;  Laterality: Left;   LUMBAR LAMINECTOMY/DECOMPRESSION MICRODISCECTOMY N/A 12/13/2014   Procedure: LUMBAR THREE-FOUR, LUMBAR FOUR-FIVE, LUMBAR FIVE-SACRAL ONE LAMINECTOMY;  Surgeon: Kristeen Miss, MD;  Location: [place] [facility];  Service: Neurosurgery;  Laterality: N/A;  L3-4 L4-5 L5-S1 Laminectomy   LUMBAR LAMINECTOMY/DECOMPRESSION MICRODISCECTOMY Left 06/05/2021   Procedure: Left lumbar one-two, Lumbar two-three Laminectomy;  Surgeon: Kristeen Miss, MD;  Location: [place];  Service: Neurosurgery;  Laterality: Left;   MENISCUS REPAIR     Right knee   MINOR APPLICATION OF WOUND VAC Left 02/20/2021   Procedure: MINOR APPLICATION OF KERLIX PACKING/REMOVAL OF WOUND VAC;  Surgeon: Johnathan Hausen, MD;  Location: [facility];  Service: General;  Laterality: Left;   TEE WITHOUT CARDIOVERSION N/A 02/05/2021   Procedure: TRANSESOPHAGEAL ECHOCARDIOGRAM (TEE);  Surgeon: Sueanne Margarita, MD;  Location: [facility];  Service: Cardiovascular;  Laterality: N/A;   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   TOTAL SHOULDER ARTHROPLASTY Right 02/10/2021   Procedure: RIGHT OPEN SHOULDER I & D, OPEN DISTAL CLAVICLE RESECTION;  Surgeon: Marchia Bond, MD;  Location: [facility];  Service: Orthopedics;  Laterality: Right;   VASECTOMY      Assessment & Plan Clinical Impression: John Ware is a 55 year old male with history of T2DM with  neuropathy BLE, HTN, lumbar decompression 11/2014 and admission 06/08- 03/15/21 for 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 He was admitted on 05/28/21 with back pain and LLE weakness X one week and intermittent fevers. No bowel or bladder complaints. He was started on Cefazolin and MRI spine done revealing found to have ongoing diskitis with osteomyelitis at L1/L2 and L2/L3 with new/increased ventral epidural phlegmon centered at L1 with severe canal stenosis and compression of cauda equina nerve roots, phlegmon also seen along ventral aspect of L1 and L2 vertebral bodies, no significant change in fluid collection in soft tissues L4/L5 favored to be seroma and overall interval decrease in  size and number of bilateral psoas abscessed with small residual abscess and diffuse signal abnormality throughout the remainder of psoas and paraspinal musculature consistent with infectious/inflammatory myositis.     Dr. Ellene Route consulted and recommended monitoring for neurological recovery/improvement. Patient continued to have weakness and pain therefore elected on undergoing decompression of left L1/2 and L2/3 dural tube and nerve roots of L1-L3 by Dr. Ellene Route on 10/11.  No pus encountered and tissue sent for cultures. Post op has not had improvement in LLE motor recovery, had issues with low BP, acute blood loss anemia, labile BS as well as ongoing issues with back pain. MRI L spine repeated 10/15 revealing enhancing ventral collection at L1 and L2 similar in appearance without progression and interval decompression of L1/2 and L2/3. Dr. Ellene Route has reviewed films--stenosis better and plans to follow up in a month on outpatient basis.  MS contin added today to help with pain control.  Repeat blood cultures negative, rifampin added on 10/14 and Cefazolin changed to Vancomycin on 10/15 per Dr. Vito Berger recommendations.  Continues to have low grade fevers in evening and WBC on upward trend. Patient continues to have LLE weakness with instability , balance deficits and pain affecting ADLs and mobility. CIR recommended due to functional decline.  Patient transferred to CIR on 06/11/2021 .    Patient currently requires max with basic self-care skills secondary to muscle weakness, decreased cardiorespiratoy endurance, impaired timing and sequencing, decreased coordination, and decreased motor planning, and decreased sitting balance, decreased standing balance, decreased postural control, and decreased balance strategies.  Prior to hospitalization, patient could complete BADL and IADL with independent .  Patient will benefit from skilled intervention to decrease level of assist with basic self-care skills and increase independence with basic self-care skills prior to discharge home with care partner.  Anticipate patient will require 24 hour supervision and follow up home health.  OT - End of Session Activity Tolerance: Tolerates 10 - 20 min activity with multiple rests Endurance Deficit: Yes OT Assessment OT Patient demonstrates impairments in the following area(s): Balance;Endurance;Motor;Pain;Safety;Sensory OT Basic ADL's Functional Problem(s): Grooming;Bathing;Dressing;Toileting OT Transfers Functional Problem(s): Toilet;Tub/Shower OT Additional Impairment(s): None;Fuctional Use of Upper Extremity OT Plan OT Intensity: Minimum of 1-2 x/day, 45 to 90 minutes OT Frequency: 5 out of 7 days OT Duration/Estimated Length of Stay: 14-16 days OT Treatment/Interventions: Balance/vestibular training;Discharge planning;Pain management;Self Care/advanced ADL retraining;Therapeutic Activities;UE/LE Coordination activities;Visual/perceptual remediation/compensation;Therapeutic Exercise;Skin care/wound managment;Patient/family education;Functional mobility training;Disease mangement/prevention;Community reintegration;DME/adaptive  equipment instruction;Neuromuscular re-education;Psychosocial support;UE/LE Strength taining/ROM;Wheelchair propulsion/positioning;Splinting/orthotics OT Self Feeding Anticipated Outcome(s): no goal OT Basic Self-Care Anticipated Outcome(s): Supervision - Min A OT  Toileting Anticipated Outcome(s): Supervision OT Bathroom Transfers Anticipated Outcome(s): Supervision OT Recommendation Recommendations for Other Services: Neuropsych consult;Therapeutic Recreation consult Therapeutic Recreation Interventions: Stress management Patient destination: Home Follow Up Recommendations: Home health OT Equipment Recommended: To be determined   OT Evaluation Precautions/Restrictions  Precautions Precautions: Fall;Back Precaution Comments: recalled 3/3 precautions Restrictions Weight Bearing Restrictions: No Pain Pain Assessment Pain Scale: 0-10 Pain Score: 10-Worst pain ever Home Living/Prior [place] expects to be discharged to:: Private residence Living Arrangements: Spouse/significant other, Children Available Help at Discharge: Family Type of Home: House Home Layout: One level Bathroom Shower/Tub: Chiropodist: Handicapped height (per chart) Bathroom Accessibility: Yes  Lives With: Spouse Prior Function Level of Independence: Independent with basic ADLs, Independent with homemaking with ambulation, Independent with gait, Independent with transfers Vision Baseline Vision/History: 1 Wears glasses (per chart) Ability to See in Adequate Light: 0 Adequate (WFL for ADL tasks, no deficits noted at eval) Patient Visual Report: No change from baseline Perception  Perception: Within Functional Limits Praxis Praxis: Intact Cognition Overall Cognitive Status: Within Functional Limits for tasks assessed Arousal/Alertness: Awake/alert Orientation Level: Person;Place;Situation Person: Oriented Place: Oriented Situation: Oriented Year:  2022 Month: October Day of Week: Correct Memory: Appears intact Immediate Memory Recall: Sock;Blue;Bed Memory Recall Sock: Without Cue Memory Recall Blue: Without Cue Memory Recall Bed: Without Cue Awareness: Appears intact Problem Solving: Appears intact Safety/Judgment: Appears intact Comments: impacted by pain throughout session Sensation Sensation Light Touch: Impaired by gross assessment (BLEs with L>R) Proprioception: Impaired by gross assessment Coordination Gross Motor Movements are Fluid and Coordinated: No Fine Motor Movements are Fluid and Coordinated: Yes Coordination and Movement Description: grossly uncoordinated 2/2 pain Motor  Motor Motor: Abnormal postural alignment and control Motor - Skilled Clinical Observations: limited by pain  Trunk/Postural Assessment  Cervical Assessment Cervical Assessment: Exceptions to [facility] (FWD head) Thoracic Assessment Thoracic Assessment: Exceptions to [facility] (rounded shoulders) Lumbar Assessment Lumbar Assessment: Exceptions to [facility] Postural Control Postural Control: Deficits on evaluation  Balance Balance Balance Assessed: Yes Dynamic Sitting Balance Dynamic Sitting - Balance Support: Feet supported;Bilateral upper extremity supported Dynamic Sitting - Level of Assistance: 5: Stand by assistance Dynamic Sitting - Balance Activities: Reaching for objects Static Standing Balance Static Standing - Balance Support: During functional activity;Bilateral upper extremity supported Static Standing - Level of Assistance: 4: Min assist Dynamic Standing Balance Dynamic Standing - Balance Support: During functional activity;Bilateral upper extremity supported Dynamic Standing - Level of Assistance: 3: Mod assist Extremity/Trunk Assessment RUE Assessment RUE Assessment: Exceptions to [facility] Passive Range of Motion (PROM) Comments: approx 120* PROM Active Range of Motion (AROM) Comments: approx 90* General Strength Comments: limited 2/2  surgical intervention LUE Assessment LUE Assessment: Exceptions to [facility] Passive Range of Motion (PROM) Comments: approx 140-150* Active Range of Motion (AROM) Comments: approx 100* General Strength Comments: limited 2/2 surgical intervention  Care Tool Care Tool Self Care Eating   Eating Assist Level: Set up assist    Oral Care    Oral Care Assist Level: Set up assist    Bathing   Body parts bathed by patient: Right arm;Left arm;Chest;Abdomen;Right upper leg;Left upper leg;Face Body parts bathed by helper: Front perineal area;Buttocks;Right lower leg;Left lower leg   Assist Level: Maximal Assistance - Patient 24 - 49%    Upper Body Dressing(including orthotics)   What is the patient wearing?: Pull over shirt   Assist Level: Supervision/Verbal cueing    Lower Body Dressing (excluding footwear)   What is the patient wearing?: Pants Assist for  lower body dressing: Total Assistance - Patient < 25% (simulated)    Putting on/Taking off footwear      Total A       Care Tool Toileting Toileting activity    Max A     Care Tool Bed Mobility Roll left and right activity    Mod A    Sit to lying activity    Max A    Lying to sitting on side of bed activity   Lying to sitting on side of bed assist level: the ability to move from lying on the back to sitting on the side of the bed with no back support.: Maximal Assistance - Patient 25 - 49%     Care Tool Transfers Sit to stand transfer   Sit to stand assist level: Moderate Assistance - Patient 50 - 74%    Chair/bed transfer   Chair/bed transfer assist level: Moderate Assistance - Patient 50 - 74%     Toilet transfer   Assist Level: Moderate Assistance - Patient 50 - 74%     Care Tool Cognition  Expression of Ideas and Wants Expression of Ideas and Wants: 4. Without difficulty (complex and basic) - expresses complex messages without difficulty and with speech that is clear and easy to understand  Understanding Verbal  and Non-Verbal Content Understanding Verbal and Non-Verbal Content: 4. Understands (complex and basic) - clear comprehension without cues or repetitions   Memory/Recall Ability Memory/Recall Ability : Current season;That he or she is in a hospital/hospital unit   Refer to Care Plan for [place] 1 OT Short Term Goal 1 (Week 1): Pt will perform stand pivot to BSC/toilet with LRAD Min A OT Short Term Goal 2 (Week 1): Pt will perform LB dress with Mod A and AE PRN OT Short Term Goal 3 (Week 1): Pt will perform sit <> stands in prep for ADL with Min A at [place] Goal 4 (Week 1): Pt will increase AROM for shoulder flexion to 120* in B shoulders  Recommendations for other services: Neuropsych and Therapeutic Recreation  Stress management   Skilled Therapeutic Intervention ADL ADL Eating: Set up Grooming: Setup Upper Body Bathing: Minimal assistance Lower Body Bathing: Dependent Upper Body Dressing: Minimal assistance Lower Body Dressing: Dependent (simulated) Toileting: Maximal assistance (simulated) Toilet Transfer: Moderate assistance Toilet Transfer Method: Stand pivot Mobility  Bed Mobility Bed Mobility: Right Sidelying to Sit;Rolling Right;Supine to Sit Rolling Right: Moderate Assistance - Patient 50-74% Right Sidelying to Sit: Maximal Assistance - Patient 25-49% Supine to Sit: Maximal Assistance - Patient - Patient 25-49% Transfers Sit to Stand: Moderate Assistance - Patient 50-74% Stand to Sit: Moderate Assistance - Patient 50-74%  Skilled intervention: Pt greeted at time of session reclined in bed supine, agreeable to OT session but stating he is in significant pain in hips and some in back. Pt requesting pain meds at beginning of session, stating he did not feel like he could do much or participate until received pain meds. Initial part of eval spent coordinating with RN x2 per pt request, but unable to give meds until pharmacy approval  which happened during session and pain meds provided. Bed mobility supine > sit Max A with log roll technique and sitting EOB in significant pain, improved slightly scooting FWD. Stand pivot Mod A bed > recliner. Recliner brought to sink surface with IV pole as well and UB bathe/dress Min A overall. Declined bathing/dressing LB today, simulated instead with  Max/total for LB. Extended time needed for all tasks 2/2 maneuvering IV pole and providing rest breaks 2/2 pain. Pt up in chair with alarm on call bell in reach. Missed 12 mins at beginning of session 2/2 previous pt care.    Discharge Criteria: Patient will be discharged from OT if patient refuses treatment 3 consecutive times without medical reason, if treatment goals not met, if there is a change in medical status, if patient makes no progress towards goals or if patient is discharged from hospital.  The above assessment, treatment plan, treatment alternatives and goals were discussed and mutually agreed upon: by patient  Viona Gilmore 06/12/2021, 12:03 PM

## 2021-06-12 NOTE — Evaluation (Signed)
Physical Therapy Assessment and Plan  Patient Details  Name: John Ware MRN: [id number] Date of Birth: 1966/03/13  PT Diagnosis: Difficulty walking, Impaired sensation, Low back pain, Muscle spasms, Muscle weakness, and Pain in back, R hip Rehab Potential: Good ELOS: 14-16   Today's Date: 06/12/2021 PT Individual Time: 1300-1408 PT Individual Time Calculation (min): 68 min    Hospital Problem: Active Problems:   Septic discitis of lumbar region   Past Medical History:  Past Medical History:  Diagnosis Date   Depression    Diabetes mellitus without complication ([place])    Hypertension    Spinal stenosis    With Neurogenic Claudication   Past Surgical History:  Past Surgical History:  Procedure Laterality Date   CARPAL TUNNEL RELEASE Bilateral    INCISION AND DRAINAGE ABSCESS Left 03/12/2021   Procedure: INCISION AND DRAINAGE ABSCESS;  Surgeon: Marchia Bond, MD;  Location: [facility];  Service: Orthopedics;  Laterality: Left;   IR US GUIDE BX ASP/DRAIN  02/02/2021   IRRIGATION AND DEBRIDEMENT ABSCESS Left 02/08/2021   Procedure: MINOR INCISION AND DRAINAGE OF ABSCESS;  Surgeon: Marchia Bond, MD;  Location: [facility];  Service: Orthopedics;  Laterality: Left;   IRRIGATION AND DEBRIDEMENT ABSCESS Left 02/10/2021   Procedure: IRRIGATION AND DEBRIDEMENT ABSCESS LEFT LATERAL CHEST WALL;  Surgeon: Greer Pickerel, MD;  Location: [facility];  Service: General;  Laterality: Left;   IRRIGATION AND DEBRIDEMENT ABSCESS Bilateral 02/20/2021   Procedure: MINOR INCISION AND DRAINAGE OF ABSCESS/WITH BILATERAL WOUND VAC PLACEMENT;  Surgeon: Marchia Bond, MD;  Location: [facility];  Service: Orthopedics;  Laterality: Bilateral;   IRRIGATION AND DEBRIDEMENT SHOULDER Bilateral 02/28/2021   Procedure: IRRIGATION AND DEBRIDEMENT SHOULDER;  Surgeon: Hiram Gash, MD;  Location: [facility];  Service: Orthopedics;  Laterality: Bilateral;   IRRIGATION AND DEBRIDEMENT SHOULDER Left 03/06/2021   Procedure: IRRIGATION AND  DEBRIDEMENT SHOULDER;  Surgeon: Renette Butters, MD;  Location: [facility];  Service: Orthopedics;  Laterality: Left;   IRRIGATION AND DEBRIDEMENT SHOULDER Left 03/08/2021   Procedure: IRRIGATION AND DEBRIDEMENT SHOULDER;  Surgeon: Marchia Bond, MD;  Location: [facility];  Service: Orthopedics;  Laterality: Left;   LUMBAR LAMINECTOMY/DECOMPRESSION MICRODISCECTOMY N/A 12/13/2014   Procedure: LUMBAR THREE-FOUR, LUMBAR FOUR-FIVE, LUMBAR FIVE-SACRAL ONE LAMINECTOMY;  Surgeon: Kristeen Miss, MD;  Location: [place] [facility];  Service: Neurosurgery;  Laterality: N/A;  L3-4 L4-5 L5-S1 Laminectomy   LUMBAR LAMINECTOMY/DECOMPRESSION MICRODISCECTOMY Left 06/05/2021   Procedure: Left lumbar one-two, Lumbar two-three Laminectomy;  Surgeon: Kristeen Miss, MD;  Location: [place];  Service: Neurosurgery;  Laterality: Left;   MENISCUS REPAIR     Right knee   MINOR APPLICATION OF WOUND VAC Left 02/20/2021   Procedure: MINOR APPLICATION OF KERLIX PACKING/REMOVAL OF WOUND VAC;  Surgeon: Johnathan Hausen, MD;  Location: [facility];  Service: General;  Laterality: Left;   TEE WITHOUT CARDIOVERSION N/A 02/05/2021   Procedure: TRANSESOPHAGEAL ECHOCARDIOGRAM (TEE);  Surgeon: Sueanne Margarita, MD;  [facility];  Service: Cardiovascular;  Laterality: N/A;   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   TOTAL SHOULDER ARTHROPLASTY Right 02/10/2021   Procedure: RIGHT OPEN SHOULDER I & D, OPEN DISTAL CLAVICLE RESECTION;  Surgeon: Marchia Bond, MD;  Location: [facility];  Service: Orthopedics;  Laterality: Right;   VASECTOMY      Assessment & Plan Clinical Impression: John Ware is a 55 year old male with history of T2DM with neuropathy BLE, HTN, lumbar decompression 11/2014 and admission 06/08- 03/15/21 for 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 He was admitted on 05/28/21 with back pain and LLE weakness X one week and intermittent fevers. No bowel or bladder complaints. He was started on Cefazolin and MRI spine done revealing found to have ongoing diskitis with osteomyelitis at L1/L2 and L2/L3 with new/increased ventral epidural phlegmon centered at L1 with severe canal stenosis and compression of cauda equina nerve roots, phlegmon also seen along ventral aspect of L1 and L2 vertebral bodies, no significant change in fluid collection in soft tissues L4/L5 favored to be seroma and overall interval decrease in  size and number of bilateral psoas abscessed with small residual abscess and diffuse signal abnormality throughout the remainder of psoas and paraspinal musculature consistent with infectious/inflammatory myositis.     Dr. Ellene Route consulted and recommended monitoring for neurological recovery/improvement. Patient continued to have weakness and pain therefore elected on undergoing decompression of left L1/2 and L2/3 dural tube and nerve roots of L1-L3 by Dr. Ellene Route on 10/11.  No pus encountered and tissue sent for cultures. Post op has not had improvement in LLE motor recovery, had issues with low BP, acute blood loss anemia, labile BS as well as ongoing issues with back pain. MRI L spine repeated 10/15 revealing enhancing ventral collection at L1 and L2 similar in appearance without progression and interval decompression of L1/2 and L2/3. Dr. Ellene Route has reviewed films--stenosis better and plans to follow up in a month on outpatient basis.  MS contin added today to help with pain control.  Repeat blood cultures negative, rifampin added on 10/14 and Cefazolin changed to Vancomycin on 10/15 per Dr. Vito Berger recommendations. Continues to have low grade fevers in evening and WBC on upward trend. Patient continues to have LLE  weakness with instability , balance deficits and pain affecting ADLs and mobility. CIR recommended due to functional decline.  Patient transferred to CIR on 06/11/2021   Patient currently requires mod with mobility secondary to muscle weakness, decreased cardiorespiratoy endurance, abnormal tone, unbalanced muscle activation, and decreased coordination, and decreased standing balance, decreased postural control, and decreased balance strategies.  Prior to hospitalization, patient was independent  with mobility and lived with Spouse, Daughter in a House home.  Home access is 3, can reach both handrailsStairs to enter.  Patient will benefit from skilled PT intervention to maximize safe functional mobility, minimize fall risk, and decrease caregiver burden for planned discharge home with 24 hour supervision.  Anticipate patient will benefit from follow up [place] at discharge.  PT - End of Session Activity Tolerance: Tolerates 10 - 20 min activity with multiple rests Endurance Deficit: Yes Endurance Deficit Description: extremely limited by pain PT Assessment Rehab Potential (ACUTE/IP ONLY): Good PT Barriers to Discharge: Decreased caregiver support;Home environment access/layout;IV antibiotics;Wound Care PT Patient demonstrates impairments in the following area(s): Balance;Safety;Behavior;Sensory;Endurance;Skin Integrity;Motor;Pain PT Transfers Functional Problem(s): Bed Mobility;Bed to Chair;Car PT Locomotion Functional Problem(s): Ambulation;Wheelchair Mobility;Stairs PT Plan PT Intensity: Minimum of 1-2 x/day ,45 to 90 minutes PT Frequency: 5 out of 7 days PT Duration Estimated Length of Stay: 14-16 PT Treatment/Interventions: Ambulation/gait training;Cognitive remediation/compensation;Discharge planning;DME/adaptive equipment instruction;Functional mobility training;Pain management;Psychosocial support;Splinting/orthotics;Therapeutic Activities;UE/LE Strength taining/ROM;Visual/perceptual  remediation/compensation;Balance/vestibular training;Disease management/prevention;Community reintegration;Functional electrical stimulation;Neuromuscular re-education;Patient/family education;Skin care/wound management;Stair training;Therapeutic Exercise;UE/LE Coordination activities;Wheelchair propulsion/positioning PT Transfers Anticipated Outcome(s): supervision PT Locomotion  Anticipated Outcome(s): supervision w/c, CGA gait PT Recommendation Recommendations for Other Services: Neuropsych consult;Therapeutic Recreation consult Therapeutic Recreation Interventions: Pet therapy;Stress management Follow Up Recommendations: Home health PT Patient destination: Home Equipment Recommended: Rolling walker with 5" wheels  Skilled Therapeutic Interventions/Progress Updates:  Evaluation completed (see details above and below) with education on PT POC and goals and individual treatment initiated with focus on initiating functional mobility. Pt seated in recliner on arrival with his wife present and agreeable to evaluation. Pt rated his back pain at 8/10, nsg administered pain medication during session. During subjective exam, pt requested to use the bathroom. Stand pivot transfer to [facility] with RW and mod A, mod A to stand to RW. Pt had small urine void but no bowel void as he thought. Assist for for 3/3 toileting tasks. While seated on commode pt asked to change pants. Tot A to thread underwear and pants, max A to pull pants over hips in standing. Pt returned to recliner in same manner but sat quickly with uncontrolled descent. Pt became tearful, reporting extreme pain in R hip, managed with deep breathing and positioning. Attempted further Sit to stand training, but pt was unable d/t pain. Pt remained in recliner with his wife present and was left with all needs in reach and alarm active.   PT Evaluation Precautions/Restrictions Precautions Precautions: Fall;Back Precaution Comments: recalled 3/3  precautions General   Vital SignsTherapy Vitals Temp: (!) 97.4 F (36.3 C) Temp Source: Oral Pulse Rate: (!) 101 Resp: 16 BP: 123/85 Patient Position (if appropriate): Sitting Oxygen Therapy SpO2: 100 % O2 Device: Room Air Pain Pain Assessment Pain Scale: 0-10 Pain Score: 10-Worst pain ever Pain Type: Surgical pain Pain Location: Hip Pain Orientation: Right;Left Pain Descriptors / Indicators: Aching Pain Frequency: Constant Pain Onset: On-going Patients Stated Pain Goal: 3 Pain Intervention(s): Medication (See eMAR);Repositioned Pain Interference Pain Interference Pain Effect on Sleep: 4. Almost constantly Pain Interference with Therapy Activities: 3. Frequently Pain Interference with Day-to-Day Activities: 2. Occasionally Home Living/Prior Functioning Home Living Available Help at Discharge: Family Type of Home: House Home Access: Stairs to enter [employer] of Steps: 3, can reach both handrails Entrance Stairs-Rails: Can reach both Home Layout: One level  Lives With: Spouse;Daughter Prior Function Level of Independence: Independent with gait;Independent with transfers Vision/Perception  Perception Perception: Within Functional Limits Praxis Praxis: Intact  Cognition Overall Cognitive Status: Within Functional Limits for tasks assessed Arousal/Alertness: Awake/alert Orientation Level: Oriented X4 Year: 2022 Month: October Day of Week: Correct Memory: Appears intact Safety/Judgment: Appears intact Comments: impacted by pain throughout session Sensation Sensation Light Touch: Impaired by gross assessment Proprioception: Impaired by gross assessment Coordination Gross Motor Movements are Fluid and Coordinated: No Coordination and Movement Description: grossly uncoordinated 2/2 pain Motor  Motor Motor: Abnormal postural alignment and control Motor - Skilled Clinical Observations: limited by pain   Trunk/Postural Assessment  Cervical  Assessment Cervical Assessment: Exceptions to [facility] Thoracic Assessment Thoracic Assessment: Exceptions to [facility] Lumbar Assessment Lumbar Assessment: Exceptions to [facility] Postural Control Postural Control: Deficits on evaluation  Balance Balance Balance Assessed: Yes Dynamic Sitting Balance Dynamic Sitting - Balance Support: Feet supported;Bilateral upper extremity supported Dynamic Sitting - Level of Assistance: 5: Stand by assistance Static Standing Balance Static Standing - Balance Support: During functional activity;Bilateral upper extremity supported Static Standing - Level of Assistance: 4: Min assist Dynamic Standing Balance Dynamic Standing - Balance Support: During functional activity;Bilateral upper extremity supported Dynamic Standing - Level of Assistance: 3: Mod assist Extremity Assessment  RLE Assessment RLE Assessment: Exceptions to [facility] General Strength Comments: Grossly 4-/5, limited by pain LLE Assessment LLE Assessment: Exceptions to [facility] LLE Strength Left Hip Flexion: 2/5 Left Knee Flexion: 3+/5 Left Knee Extension: 2+/5 Left Ankle Dorsiflexion: 3/5 Left Ankle Plantar Flexion: 3+/5  Care Tool Care Tool Bed Mobility Roll left and right activity   Roll left and right assist level: Minimal Assistance - Patient > 75%    Sit to lying activity   Sit to lying assist level: Minimal Assistance - Patient > 75%    Lying to sitting on side of bed activity   Lying to sitting on side of bed assist level: the ability to move from lying on the back to sitting on the side of the bed with no back support.: Maximal Assistance - Patient 25 - 49%     Care Tool Transfers Sit to stand transfer   Sit to stand assist level: Moderate Assistance - Patient 50 - 74%    Chair/bed transfer   Chair/bed transfer assist level: Moderate Assistance - Patient 50 - 74%     Psychologist, counselling transfer activity did not occur: Safety/medical concerns         Care Tool Locomotion Ambulation Ambulation activity did not occur: Safety/medical concerns        Walk 10 feet activity Walk 10 feet activity did not occur: Safety/medical concerns       Walk 50 feet with 2 turns activity Walk 50 feet with 2 turns activity did not occur: Safety/medical concerns      Walk 150 feet activity Walk 150 feet activity did not occur: Safety/medical concerns      Walk 10 feet on uneven surfaces activity Walk 10 feet on uneven surfaces activity did not occur: Safety/medical concerns      Stairs Stair activity did not occur: Safety/medical concerns        Walk up/down 1 step activity Walk up/down 1 step or curb (drop down) activity did not occur: Safety/medical concerns     Walk up/down 4 steps activity did not occuR: Safety/medical concerns  Walk up/down 4 steps activity      Walk up/down 12 steps activity Walk up/down 12 steps activity did not occur: Safety/medical concerns      Pick up small objects from floor Pick up small object from the floor (from standing position) activity did not occur: Safety/medical concerns      Wheelchair     Wheelchair activity did not occur: Safety/medical concerns      Wheel 50 feet with 2 turns activity Wheelchair 50 feet with 2 turns activity did not occur: Safety/medical concerns    Wheel 150 feet activity Wheelchair 150 feet activity did not occur: Safety/medical concerns      Refer to Care Plan for Long Term Goals  SHORT TERM GOAL WEEK 1 PT Short Term Goal 1 (Week 1): Pt will initate gait training PT Short Term Goal 2 (Week 1): Pt will self-propel w/c x 100 ft PT Short Term Goal 3 (Week 1): Pt will perform STS with min A  Recommendations for other services: Neuropsych and Therapeutic Recreation  Pet therapy and Stress management  Skilled Therapeutic Intervention Mobility Bed Mobility Bed Mobility: Right Sidelying to Sit;Rolling Right;Supine to Sit Rolling Right: Moderate Assistance - Patient  50-74% Right Sidelying to Sit: Maximal Assistance - Patient 25-49% Supine to Sit: Maximal Assistance - Patient - Patient 25-49% Transfers Transfers: Sit to Stand;Stand Pivot Transfers;Stand  to Sit Sit to Stand: Moderate Assistance - Patient 50-74% Stand to Sit: Moderate Assistance - Patient 50-74% Stand Pivot Transfers: Moderate Assistance - Patient 50 - 74% Stand Pivot Transfer Details: Verbal cues for technique;Verbal cues for safe use of DME/AE;Verbal cues for precautions/safety;Verbal cues for sequencing Transfer (Assistive device): Rolling walker Locomotion  Gait Ambulation: No Gait Distance (Feet): 0 Feet Gait Gait: No Stairs / Additional Locomotion Stairs: No Wheelchair Mobility Wheelchair Mobility: No   Discharge Criteria: Patient will be discharged from PT if patient refuses treatment 3 consecutive times without medical reason, if treatment goals not met, if there is a change in medical status, if patient makes no progress towards goals or if patient is discharged from hospital.  The above assessment, treatment plan, treatment alternatives and goals were discussed and mutually agreed upon: by patient  Mickel Fuchs 06/12/2021, 4:25 PM

## 2021-06-13 DIAGNOSIS — M549 Dorsalgia, unspecified: Secondary | ICD-10-CM | POA: Diagnosis not present

## 2021-06-13 DIAGNOSIS — M4646 Discitis, unspecified, lumbar region: Secondary | ICD-10-CM | POA: Diagnosis not present

## 2021-06-13 LAB — GLUCOSE, CAPILLARY
Glucose-Capillary: 137 mg/dL — ABNORMAL HIGH (ref 70–99)
Glucose-Capillary: 138 mg/dL — ABNORMAL HIGH (ref 70–99)
Glucose-Capillary: 101 mg/dL — ABNORMAL HIGH (ref 70–99)
Glucose-Capillary: 136 mg/dL — ABNORMAL HIGH (ref 70–99)

## 2021-06-13 MED ORDER — DAPTOMYCIN 500 MG IV SOLR
8.0000 mg/kg | Freq: Every day | INTRAVENOUS | Status: DC
  Administered 2021-06-13 – 2021-06-21 (×8): 600 mg via INTRAVENOUS
  Filled 2021-06-13 (×11): qty 12

## 2021-06-13 MED ORDER — ENOXAPARIN SODIUM 40 MG/0.4ML IJ SOSY
40.0000 mg | PREFILLED_SYRINGE | INTRAMUSCULAR | Status: DC
  Administered 2021-06-13 – 2021-06-25 (×13): 40 mg via SUBCUTANEOUS
  Filled 2021-06-13 (×14): qty 0.4

## 2021-06-13 MED ORDER — DICLOFENAC SODIUM 75 MG PO TBEC
75.0000 mg | DELAYED_RELEASE_TABLET | Freq: Two times a day (BID) | ORAL | Status: DC
  Administered 2021-06-13 – 2021-06-26 (×26): 75 mg via ORAL
  Filled 2021-06-13 (×27): qty 1

## 2021-06-13 NOTE — Progress Notes (Addendum)
PHARMACY CONSULT NOTE FOR:  OUTPATIENT  PARENTERAL ANTIBIOTIC THERAPY (OPAT)  Indication: Lumbar Wound Infection Regimen: Daptomycin 600 mg (8mg /kg) IV q24h Rifampin 300 mg PO BID End date: 07/31/21  IV antibiotic discharge orders are pended. To discharging provider:  please sign these orders via discharge navigator,  Select New Orders & click on the button choice - Manage This Unsigned Work.     Thank you for allowing pharmacy to be a part of this patient's care.  14/6/22, PharmD PGY2 Infectious Diseases Pharmacy Resident   Please check [email or web address] for unit-specific pharmacy phone numbers

## 2021-06-13 NOTE — Progress Notes (Addendum)
I have seen and examined the patient. I have personally reviewed the clinical findings, laboratory findings, microbiological data and imaging studies. The assessment and treatment plan was discussed with the Nurse Practitioner Mauricio Po. I agree with her/his recommendations except following additions/corrections.  Sitting up in the chair and has been working with PT There is some improvement in weakness in his left leg ( he is able to contract his muscles of thigh and leg in order to try to lift his leg against resistance). He needs assistance to walk No new recommendations from neurosx  Afebrile, WBC 11.7 No issues with IV and PO abtx PICC line in rt arm  Discussed plan with him to continue 8 weeks of IV abtx from date of OR  Will change Vancomycin to Daptomycin for better safety profile Continue PO rifampin Duration of both abtx around 8 weeks from 06/05/21 ID pharmacy to place OPAT orders Monitor CBC, CMP and CPK Follow up with [facility] has been made ID will sign off. Please call with questions   Rosiland Oz, MD Infectious Disease Physician [facility] [address] [place], [place] [address] Phone: [phone]  Fax: [facility]  Date of Admission:  06/11/2021     Total days of antibiotics 16         ASSESSMENT:  [name] continues to have back pain and is working with Rehabilitation team. PICC line in place and functioning appropriately. Will need 8 weeks of antimicrobial from the date of surgery which will place end date of 07/31/21 with likely need for continued suppression following IV treatment Change vancomycin to daptomycin to reduce risk of renal side effects and continue rifampin. Therapeutic drug monitoring of CK levels weekly while on Daptomycin. OPAT orders placed for when he gets discharged. Plan was discussed with Mr. Detweiler who is in agreement. ID will sign  off.  PLAN:  Change antibiotics to daptomycin and rifampin.  Monitor CK levels weekly while on daptomycin OPAT/Home Health orders Pain management and remaining care per primary team. Follow up in ID clinic.   Diagnosis:  Discitis / osteomyelitis   Culture Result: MRSA  Allergies  Allergen Reactions   Tizanidine Other (See Comments)    Leg cramps    OPAT Orders Discharge antibiotics to be given via PICC line Discharge antibiotics: Daptomycin  Per pharmacy protocol  Aim for Vancomycin trough 15-20 or AUC 400-550 (unless otherwise indicated) Duration: 8 weeks  End Date: 07/31/21   [facility] Per Protocol:  Home health RN for IV administration and teaching; PICC line care and labs.    Labs weekly while on IV antibiotics: _X_ CBC with differential _X_ BMP __ CMP _X_ CRP _X_ ESR __ Vancomycin trough _X_ CK  __ Please pull PIC at completion of IV antibiotics _X_ Please leave PIC in place until doctor has seen patient or been notified  Fax weekly labs to [phone]  Clinic Follow Up Appt:  Dr. West Bali 11/22 at 4 pm    Active Problems:   Septic discitis of lumbar region    acetaminophen  650 mg Oral Q6H   vitamin C  500 mg Oral BID   Chlorhexidine Gluconate Cloth  6 each Topical Daily   diclofenac  75 mg Oral BID   gabapentin  100 mg Oral BID   gabapentin  800 mg Oral QHS   HYDROmorphone HCl  16 mg Oral Q24H   insulin aspart  0-5 Units Subcutaneous QHS   insulin aspart  0-9 Units Subcutaneous TID WC   insulin glargine-yfgn  8 Units Subcutaneous Daily   ketorolac  30 mg Intravenous Q6H   metFORMIN  1,500 mg Oral Q breakfast   methocarbamol  1,000 mg Oral TID   multivitamin with minerals  1 tablet Oral Daily   Ensure Max Protein  11 oz Oral QHS   rifampin  300 mg Oral Q12H   senna-docusate  2 tablet Oral Daily   sodium chloride flush  10-40 mL Intracatheter Q12H   zinc sulfate  220 mg Oral Daily    SUBJECTIVE:  Afebrile overnight with no acute  events. Moved to rehabilitation. Continues to have challenges with pain control and hoping pain improves. No other new concerns/complaints.   Allergies  Allergen Reactions   Tizanidine Other (See Comments)    Leg cramps     Review of Systems: Review of Systems  Constitutional:  Negative for chills, fever and weight loss.  Respiratory:  Negative for cough, shortness of breath and wheezing.   Cardiovascular:  Negative for chest pain and leg swelling.  Gastrointestinal:  Negative for abdominal pain, constipation, diarrhea, nausea and vomiting.  Musculoskeletal:  Positive for back pain.  Skin:  Negative for rash.     OBJECTIVE: Vitals:   06/12/21 0556 06/12/21 1323 06/12/21 2011 06/13/21 0404  BP: 108/85 123/85 105/74 111/71  Pulse: 66 (!) 101 69 69  Resp: '17 16 17 16  ' Temp: 98.4 F (36.9 C) (!) 97.4 F (36.3 C) 98.2 F (36.8 C) 98.7 F (37.1 C)  TempSrc:  Oral    SpO2: 96% 100% 100% 97%  Weight:      Height:       Body mass index is 22.78 kg/m.  Physical Exam Constitutional:      General: He is not in acute distress.    Appearance: He is well-developed. He is not ill-appearing.     Comments: Seated in wheelchair next to bed; pleasant.   Cardiovascular:     Rate and Rhythm: Normal rate and regular rhythm.     Heart sounds: Normal heart sounds.  Pulmonary:     Effort: Pulmonary effort is normal.     Breath sounds: Normal breath sounds.  Skin:    General: Skin is warm and dry.  Neurological:     Mental Status: He is alert and oriented to person, place, and time.  Psychiatric:        Mood and Affect: Mood normal.    Lab Results Lab Results  Component Value Date   WBC 11.7 (H) 06/12/2021   HGB 11.5 (L) 06/12/2021   HCT 35.5 (L) 06/12/2021   MCV 80.9 06/12/2021   PLT 570 (H) 06/12/2021    Lab Results  Component Value Date   CREATININE 0.47 (L) 06/12/2021   BUN 9 06/12/2021   NA 134 (L) 06/12/2021   K 4.0 06/12/2021   CL 98 06/12/2021   CO2 27  06/12/2021    Lab Results  Component Value Date   ALT 10 06/12/2021   AST 11 (L) 06/12/2021   ALKPHOS 80 06/12/2021   BILITOT 0.3 06/12/2021     Microbiology: Recent Results (from the past 240 hour(s))  Surgical pcr screen     Status: Abnormal   Collection Time: 06/05/21 11:17 AM   Specimen: Nasal Mucosa; Nasal Swab  Result Value Ref Range Status   MRSA, PCR NEGATIVE NEGATIVE Final   Staphylococcus aureus POSITIVE (A) NEGATIVE Final  Comment: (NOTE) The Xpert SA Assay (FDA approved for NASAL specimens in patients 75 years of age and older), is one component of a comprehensive surveillance program. It is not intended to diagnose infection nor to guide or monitor treatment. Performed at [facility], [place] [address] [place], [place] [address]   Aerobic/Anaerobic Culture w Gram Stain (surgical/deep wound)     Status: None   Collection Time: 06/05/21  6:11 PM   Specimen: Soft Tissue, Other  Result Value Ref Range Status   Specimen Description WOUND LUMBAR  Final   Special Requests LUMBAR DISC PT ON ANCEF  Final   Gram Stain   Final    FEW SQUAMOUS EPITHELIAL CELLS PRESENT MODERATE WBC SEEN NO ORGANISMS SEEN    Culture   Final    No growth aerobically or anaerobically. Performed at [facility], [place] [address], [place], [place] [address]    Report Status 06/10/2021 FINAL  Final     Terri Piedra, NP [facility]  06/13/2021  9:07 AM

## 2021-06-13 NOTE — Progress Notes (Signed)
PROGRESS NOTE   Subjective/Complaints:  Pt reports pain is much better than yesterday- first said a little better, but is up in w/c, which he refused to even try early in day yesterday.   Admits last meds at 4am and is 8am- getting meds now.   ID changed his Vanc to Dapto- will need IV ABX until 8 weeks from 06/05/21. Which is 07/31/21 by my calculation.   ROS:  Pt denies SOB, abd pain, CP, N/V/C/D, and vision changes    Objective:   No results found. Recent Labs    06/11/21 0304 06/12/21 0446  WBC 12.7* 11.7*  HGB 11.3* 11.5*  HCT 35.6* 35.5*  PLT 573* 570*   Recent Labs    06/11/21 0304 06/12/21 0446  NA 133* 134*  K 4.0 4.0  CL 98 98  CO2 27 27  GLUCOSE 123* 116*  BUN 8 9  CREATININE 0.43* 0.47*  CALCIUM 9.0 9.2    Intake/Output Summary (Last 24 hours) at 06/13/2021 1401 Last data filed at 06/12/2021 1855 Gross per 24 hour  Intake 348.52 ml  Output --  Net 348.52 ml     Pressure Injury 06/11/21 Buttocks Left Stage 1 -  Intact skin with non-blanchable redness of a localized area usually over a bony prominence. (Active)  06/11/21 1617  Location: Buttocks  Location Orientation: Left  Staging: Stage 1 -  Intact skin with non-blanchable redness of a localized area usually over a bony prominence.  Wound Description (Comments):   Present on Admission: Yes     Pressure Injury 06/11/21 Buttocks Right Stage 1 -  Intact skin with non-blanchable redness of a localized area usually over a bony prominence. (Active)  06/11/21 1618  Location: Buttocks  Location Orientation: Right  Staging: Stage 1 -  Intact skin with non-blanchable redness of a localized area usually over a bony prominence.  Wound Description (Comments):   Present on Admission: Yes    Physical Exam: Vital Signs Blood pressure 111/71, pulse 69, temperature 98.7 F (37.1 C), resp. rate 16, height 5\' 10"  (1.778 m), weight 72 kg, SpO2 97  %.   General: awake, alert, appropriate, sitting up in w/c- nursing giving meds; OT in room; NAD HENT: conjugate gaze; oropharynx moist CV: regular rate; no JVD Pulmonary: CTA B/L; no W/R/R- good air movement GI: soft, NT, ND, (+)BS Psychiatric: appropriate- much brighter Neurologic: Cranial nerves II through XII intact, motor strength is 5/5 in bilateral deltoid, bicep, tricep, grip, 4/5 Right and trace left hip flexor, knee extensors, ankle dorsiflexor and plantar flexor Sensory exam normal sensation to light touch in bilateral upper and lower extremities  Assessment/Plan: 1. Functional deficits which require 3+ hours per day of interdisciplinary therapy in a comprehensive inpatient rehab setting. Physiatrist is providing close team supervision and 24 hour management of active medical problems listed below. Physiatrist and rehab team continue to assess barriers to discharge/monitor patient progress toward functional and medical goals  Care Tool:  Bathing    Body parts bathed by patient: Right arm, Left arm, Chest, Abdomen, Right upper leg, Left upper leg, Face   Body parts bathed by helper: Front perineal area, Buttocks, Right lower leg, Left lower leg  Bathing assist Assist Level: Maximal Assistance - Patient 24 - 49%     Upper Body Dressing/Undressing Upper body dressing   What is the patient wearing?: Pull over shirt    Upper body assist Assist Level: Supervision/Verbal cueing    Lower Body Dressing/Undressing Lower body dressing      What is the patient wearing?: Pants     Lower body assist Assist for lower body dressing: Total Assistance - Patient < 25% (simulated)     Toileting Toileting    Toileting assist Assist for toileting: Moderate Assistance - Patient 50 - 74%     Transfers Chair/bed transfer  Transfers assist     Chair/bed transfer assist level: Moderate Assistance - Patient 50 - 74%     Locomotion Ambulation   Ambulation assist    Ambulation activity did not occur: Safety/medical concerns          Walk 10 feet activity   Assist  Walk 10 feet activity did not occur: Safety/medical concerns        Walk 50 feet activity   Assist Walk 50 feet with 2 turns activity did not occur: Safety/medical concerns         Walk 150 feet activity   Assist Walk 150 feet activity did not occur: Safety/medical concerns         Walk 10 feet on uneven surface  activity   Assist Walk 10 feet on uneven surfaces activity did not occur: Safety/medical concerns         Wheelchair     Assist Is the patient using a wheelchair?: Yes Type of Wheelchair: Manual Wheelchair activity did not occur: Safety/medical concerns  Wheelchair assist level: Supervision/Verbal cueing Max wheelchair distance: 160ft    Wheelchair 50 feet with 2 turns activity    Assist    Wheelchair 50 feet with 2 turns activity did not occur: Safety/medical concerns   Assist Level: Supervision/Verbal cueing   Wheelchair 150 feet activity     Assist  Wheelchair 150 feet activity did not occur: Safety/medical concerns   Assist Level: Minimal Assistance - Patient > 75%   Blood pressure 111/71, pulse 69, temperature 98.7 F (37.1 C), resp. rate 16, height 5\' 10"  (1.778 m), weight 72 kg, SpO2 97 %.  Medical Problem List and Plan: 1.  Decline in mobility and ADLs secondary to LLE radiculopathy resulting in septic lumbar discitis with LE weakness/myelopathy             -patient may  shower             -ELOS/Goals: 14-18d  -con't PT and OT/CIR-  2.  Antithrombotics: -DVT/anticoagulation:  Mechanical: Sequential compression devices, below knee Bilateral lower extremities  10/19- surgery was 10/11- been 8 days- will double check with [facility] to see if can start Lovenox- Dr 11/19 agreed- so will start             -antiplatelet therapy: N/A 3. Pain Management: still has stabbing/burning bilateral hip/pelvic pain.  Continue robaxin  1000 mg TID.              --MS contin was added 10/17 in addition to prn oxycodone             --Will change home prn gabapentin 800 mg/bedtime for sleep and add gabapentin 100 mg bid during the day for neuropathic pain.              --Cymbalta? For mood and pain. 10/19- changed MS contin to Exalgo 16 mg  nightly and changed Oxycodone to 15 mg q3 hours prn- he notes makes him sleepy, but refuses changes right now - also did Toradol yesterday- will change to Diclofenac 75 mg BID as well.  4. Mood: LCSW to follow for evaluation and support. Team to provide ego support.              -antipsychotic agents: N/a 5. Neuropsych: This patient is capable of making decisions on his own behalf. 6. Skin/Wound Care: Routine pressure relief measures.  7. Fluids/Electrolytes/Nutrition: Monitor I/O. Check lytes in am.  8. 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 --Antibiotics to continue for 6 weeks? -lumbar wound cultures without growth.   10/19- ID saw today- will need IV ABX for 8 weeks from 10/11- surgery date- changed Vanc to IV Dapto and con't Rifampin- and check labs weekly.  9. Low grade fevers: T-max 99.2 noted at nights and managed with prn tylenol.   10. T2DM with peripheral neuropathy/ Hgb A1c-8.4  (was 10.2  01/2021). Will monitor BS ac/hs  --ON insulin glargine  8 units and metformin daily.             --Use SSI for elevated BS/tighter control to help promote wound healing.   10/19- BG's usually controlled 1 episode of 247- but otherwise 130s-150s- con't regimen 11. Episodic Hypotension: Has resolved. Continue to monitor BP TID.  12. Hyponatremia: Monitor as continue to vary from 130-133 13. Acute blood loss anemia: Monitor H/H for trend/recovery. 14. Leucocytosis: Monitor for fevers and other signs of worsening of infection             --monitor wounds for healing.  leared by Neurosurgery and hospitalist for rehab transfer   10/19- WBC down to 11k-  will check weekly and more often if fevers worse.  15. Low protein stores:Continue prosource BID. Ensure max once daily.              --will add vitamins/Zinc also.       LOS: 2 days A FACE TO FACE EVALUATION WAS PERFORMED  John Ware 06/13/2021, 2:01 PM

## 2021-06-13 NOTE — Progress Notes (Signed)
Physical Therapy Session Note  Patient Details  Name: John Ware MRN: [id number] Date of Birth: 17-Oct-1965  Today's Date: 06/13/2021 PT Individual Time: 5732-2025, 4270-6237 PT Individual Time Calculation (min): 53 min, 40 min  Short Term Goals: Week 1:  PT Short Term Goal 1 (Week 1): Pt will initate gait training PT Short Term Goal 2 (Week 1): Pt will self-propel w/c x 100 ft PT Short Term Goal 3 (Week 1): Pt will perform STS with min A  Skilled Therapeutic Interventions/Progress Updates:    Session 1: Pt seated in w/c on arrival and agreeable to therapy. Pt reports 4/10 pain at rest in his L lower back, premedicated. Pt reports intense pain in his R hip with activity, addressed with diaphragmatic breathing and rest breaks. Pt propelled w/c with BUE x 100 ft with supervision for endurance and UE strength. Pt then performed car transfer with RW. Mos A for Sit to stand and min A to assist with LE management. Pt required cues for technique to maintain back precautions. Then transitioned to attempting gait assessment, but pt was unable to stand from w/c. Discussed that pt was able to stand with perceived increase in support at car simulator, so likely related to fear. Pt performed part practice of anterior weight shift reaching toward mat table. Pt demoed difficulty keeping heels in contact with ground, despite appearing to have adequate range. Pt demoes emotional lability and is easily frustrated. Spent several minutes building therapeutic alliance and providing psychosocial support. Pt returned to room and remained in w/c, was left with all needs in reach and alarm active.   Session 2: Pt seated in w/c on arrival and agreeable to therapy with his wife present. Pt reported pain in his legs that he feels is similar to the pain he felt with stenosis, described as "stabbing." Discussed possible implications and provided rest breaks as needed. Pt then donned shoes and socks with supervision using  figure 4 technique. Pt requested to change shirt and did so with supervision. Pt propelled w/c with BUE for functional endurance to therapy gym. Pt utilized kinetron at 60 cm/s, 4 x , with rest breaks to improve LE strength and endurance, and to strengthen posterior chain for improved Sit to stand. Pt reported only mild back pain during session, rest breaks and positioning as needed. Pt returned to room and remained sitting in w/c, was left with all needs in reach and alarm active and his wife present.   Therapy Documentation Precautions:  Precautions Precautions: Fall, Back Precaution Comments: recalled 3/3 precautions Restrictions Weight Bearing Restrictions: No    Therapy/Group: Individual Therapy  Juluis Rainier 06/13/2021, 9:54 AM

## 2021-06-13 NOTE — Progress Notes (Signed)
Occupational Therapy Session Note  Patient Details  Name: John Ware MRN: [id number] Date of Birth: Jan 29, 1966  Today's Date: 06/13/2021 OT Individual Time: 8185-9093 and 1121-6244 OT Individual Time Calculation (min): 53 min and 43 min   Short Term Goals: Week 1:  OT Short Term Goal 1 (Week 1): Pt will perform stand pivot to BSC/toilet with LRAD Min A OT Short Term Goal 2 (Week 1): Pt will perform LB dress with Mod A and AE PRN OT Short Term Goal 3 (Week 1): Pt will perform sit <> stands in prep for ADL with Min A at LRAD OT Short Term Goal 4 (Week 1): Pt will increase AROM for shoulder flexion to 90* in B shoulders  Skilled Therapeutic Interventions/Progress Updates:    Session 1: Pt greeted at time of session semireclined in bed, agreeable to OT session but requesting pain medication prior to mobility with pain at 6/10 resting. Spoke with RN who provided pain meds quickly at beginning of session. Supine > sit Mod A with log rolling and mostly assist for trunk elevation. Sit > stand and stand pivot w/ RW bed > w/c with Mod A. MD entered at this time for rounds. Self propel to bathroom, stand pivot w/c <> BSC over toilet with Min/Mod A, clothing management in standing with assist for standing balance while pt able to help doff clothing. Pt did have BM, able to wipe with lateral leans. Difficulty standing for clothing management 2/2 L knee buckling, therapist assisting donning back over hips and L knee support during stand pivot back to wheelchair. Set up for oral hygiene and hand washing at sink. Set up with alarm on call bell in reach.   Session 2: Pt greeted at time of session up in wheelchair with wife [name] present, agreeable to OT session despite fatigue. Back pain controlled throughout session as pt just premedicated. Declined standing activity and ADL, but wanting to go outside. Self propel room > down [facility] hall wiith BUE propulsion and OT resuming for time and energy.  Transported outside [facility] entrance and pt noted to have uplifted mood and enjoyed being outside. Self propelling outside entrance <> reflection pond over uneven surfaces. Transported back inside and pt grateful to go outside today. Alarm on call bell in reach.     Therapy Documentation Precautions:  Precautions Precautions: Fall, Back Precaution Comments: recalled 3/3 precautions Restrictions Weight Bearing Restrictions: No     Therapy/Group: Individual Therapy  Erasmo Score 06/13/2021, 7:08 AM

## 2021-06-14 DIAGNOSIS — M4646 Discitis, unspecified, lumbar region: Secondary | ICD-10-CM | POA: Diagnosis not present

## 2021-06-14 LAB — GLUCOSE, CAPILLARY
Glucose-Capillary: 118 mg/dL — ABNORMAL HIGH (ref 70–99)
Glucose-Capillary: 138 mg/dL — ABNORMAL HIGH (ref 70–99)
Glucose-Capillary: 103 mg/dL — ABNORMAL HIGH (ref 70–99)
Glucose-Capillary: 120 mg/dL — ABNORMAL HIGH (ref 70–99)

## 2021-06-14 LAB — CK: Total CK: 30 U/L — ABNORMAL LOW (ref 49–397)

## 2021-06-14 MED ORDER — ASPIRIN 81 MG PO CHEW
CHEWABLE_TABLET | ORAL | Status: AC
  Administered 2021-06-14: 81 mg
  Filled 2021-06-14: qty 1

## 2021-06-14 MED ORDER — OXYCODONE HCL 5 MG PO TABS
20.0000 mg | ORAL_TABLET | ORAL | Status: DC | PRN
  Administered 2021-06-14 – 2021-06-22 (×50): 20 mg via ORAL
  Filled 2021-06-14 (×54): qty 4

## 2021-06-14 NOTE — Progress Notes (Signed)
Occupational Therapy Session Note  Patient Details  Name: John Ware MRN: [id number] Date of Birth: [birth year]  Today's Date: 06/15/2021 OT Individual Time: 1300-1356 OT Individual Time Calculation (min): 56 min   Short Term Goals: Week 1:  OT Short Term Goal 1 (Week 1): Pt will perform stand pivot to BSC/toilet with LRAD Min A OT Short Term Goal 2 (Week 1): Pt will perform LB dress with Mod A and AE PRN OT Short Term Goal 3 (Week 1): Pt will perform sit <> stands in prep for ADL with Min A at LRAD OT Short Term Goal 4 (Week 1): Pt will increase AROM for shoulder flexion to 90* in B shoulders  Skilled Therapeutic Interventions/Progress Updates:    Pt greeted in bed, premedicated for pain however asking to not ambulate during session due to overdoing during AM sessions and having some soreness in back. Supervision for logroll during supine<sit and Mod A for stand pivot<w/c using RW. Pt was escorted to the outdoor patio and session focus was placed on UB strengthening to improve functional sit<stands. Guided him through exercises using 3# dumbbells x10 reps 2 sets. He required several rest periods due to periodic back spasms. Pt self propelled over uneven terrain and up/down inclines as well. Pt was then returned to the room via w/c and remained sitting up to eat the lunch that his spouse bought him. All needs within reach and safety belt fastened at time of departure.   Therapy Documentation Precautions:  Precautions Precautions: Fall, Back Precaution Comments: recalled 3/3 precautions Restrictions Weight Bearing Restrictions: No  Pain: Pain Assessment Pain Scale: 0-10 Pain Score: 4  Pain Type: Chronic pain Pain Location: Hip Pain Orientation: Right Pain Descriptors / Indicators: Aching;Sharp Pain Frequency: Constant Pain Onset: On-going Patients Stated Pain Goal: 0 Pain Intervention(s): Medication (See eMAR) Multiple Pain Sites: No ADL: ADL Eating: Set up Grooming:  Setup Upper Body Bathing: Minimal assistance Lower Body Bathing: Dependent Upper Body Dressing: Minimal assistance Lower Body Dressing: Dependent (simulated) Toileting: Maximal assistance (simulated) Toilet Transfer: Moderate assistance Toilet Transfer Method: Stand pivot   Therapy/Group: Individual Therapy  Crystalee Ventress A Glenroy Crossen 06/15/2021, 4:46 PM

## 2021-06-14 NOTE — Progress Notes (Signed)
Occupational Therapy Session Note  Patient Details  Name: John Ware MRN: [id number] Date of Birth: [birth year]  Today's Date: 06/14/2021 OT Individual Time: 1300-1411 OT Individual Time Calculation (min): 71 min    Short Term Goals: Week 1:  OT Short Term Goal 1 (Week 1): Pt will perform stand pivot to BSC/toilet with LRAD Min A OT Short Term Goal 2 (Week 1): Pt will perform LB dress with Mod A and AE PRN OT Short Term Goal 3 (Week 1): Pt will perform sit <> stands in prep for ADL with Min A at LRAD OT Short Term Goal 4 (Week 1): Pt will increase AROM for shoulder flexion to 90* in B shoulders   Skilled Therapeutic Interventions/Progress Updates:    Pt greeted at time of session semireclined in bed resting agreeable to OT session, reported 2/10 pain at rest and additional medication provided during session from RN. Supine > sit on R side (declined trying on L side) with Mod A overall, and stand pivot from elevated bed surface with Min A and therapist guarding L knee 2/2 tendency to buckle. Set up at sink and performed UB/LB bathing with Min/Mod A as pt needed assist to obtain figure four position and standing balance at sink to reach buttocks within back precautions and periarea. UB dress se tup for shirt and Min/Mod A for underwear and pants, sit > stand level at sink for donning over hips, utilizing energy conservation to don both at the same time. Transported to gym for time conservation. Focused on B shoulder ROM with towel slides on table surface and in shoulder flexion, horizontal abduction, and slight overhead movement to tolerance. Back in room set up alarm on call bell in reach.   Therapy Documentation Precautions:  Precautions Precautions: Fall, Back Precaution Comments: recalled 3/3 precautions Restrictions Weight Bearing Restrictions: No    Therapy/Group: Individual Therapy  Erasmo Score 06/14/2021, 7:25 AM

## 2021-06-14 NOTE — Progress Notes (Signed)
PROGRESS NOTE   Subjective/Complaints:   Pt reports pain worse since therapy yesterday- likely due to getting Toradol stopping and changing to PO NSAIDs.  Pt also said Percocet better than oxycodone- I explained the only difference is the tylenol and don't think that's as much the issue.   Will increase Oxycodone after d/w Dr Phillips Odor- and then change over more to [facility].     ROS:  Pt denies SOB, abd pain, CP, N/V/C/D, and vision changes    Objective:   No results found. Recent Labs    06/12/21 0446  WBC 11.7*  HGB 11.5*  HCT 35.5*  PLT 570*   Recent Labs    06/12/21 0446  NA 134*  K 4.0  CL 98  CO2 27  GLUCOSE 116*  BUN 9  CREATININE 0.47*  CALCIUM 9.2    Intake/Output Summary (Last 24 hours) at 06/14/2021 0916 Last data filed at 06/14/2021 0505 Gross per 24 hour  Intake 20 ml  Output 800 ml  Net -780 ml     Pressure Injury 06/11/21 Buttocks Left Stage 1 -  Intact skin with non-blanchable redness of a localized area usually over a bony prominence. (Active)  06/11/21 1617  Location: Buttocks  Location Orientation: Left  Staging: Stage 1 -  Intact skin with non-blanchable redness of a localized area usually over a bony prominence.  Wound Description (Comments):   Present on Admission: Yes     Pressure Injury 06/11/21 Buttocks Right Stage 1 -  Intact skin with non-blanchable redness of a localized area usually over a bony prominence. (Active)  06/11/21 1618  Location: Buttocks  Location Orientation: Right  Staging: Stage 1 -  Intact skin with non-blanchable redness of a localized area usually over a bony prominence.  Wound Description (Comments):   Present on Admission: Yes    Physical Exam: Vital Signs Blood pressure 107/73, pulse 60, temperature 98.3 F (36.8 C), temperature source Oral, resp. rate 18, height 5\' 10"  (1.778 m), weight 72 kg, SpO2 96 %.    General: awake, alert,  appropriate, sitting on toilet- trying to poop; cachetic; NAD HENT: conjugate gaze; oropharynx moist CV: regular rate; no JVD Pulmonary: CTA B/L; no W/R/R- good air movement GI: soft, NT, ND, (+)BS Psychiatric: appropriate- irritable;  Neurological: Ox3 Skin- PICC RUE Neurologic: Cranial nerves II through XII intact, motor strength is 5/5 in bilateral deltoid, bicep, tricep, grip, 4/5 Right and trace left hip flexor, knee extensors, ankle dorsiflexor and plantar flexor Sensory exam normal sensation to light touch in bilateral upper and lower extremities  Assessment/Plan: 1. Functional deficits which require 3+ hours per day of interdisciplinary therapy in a comprehensive inpatient rehab setting. Physiatrist is providing close team supervision and 24 hour management of active medical problems listed below. Physiatrist and rehab team continue to assess barriers to discharge/monitor patient progress toward functional and medical goals  Care Tool:  Bathing    Body parts bathed by patient: Right arm, Left arm, Chest, Abdomen, Right upper leg, Left upper leg, Face   Body parts bathed by helper: Front perineal area, Buttocks, Right lower leg, Left lower leg     Bathing assist Assist Level: Maximal Assistance - Patient  24 - 49%     Upper Body Dressing/Undressing Upper body dressing   What is the patient wearing?: Pull over shirt    Upper body assist Assist Level: Supervision/Verbal cueing    Lower Body Dressing/Undressing Lower body dressing      What is the patient wearing?: Pants     Lower body assist Assist for lower body dressing: Total Assistance - Patient < 25% (simulated)     Toileting Toileting    Toileting assist Assist for toileting: Moderate Assistance - Patient 50 - 74%     Transfers Chair/bed transfer  Transfers assist     Chair/bed transfer assist level: Moderate Assistance - Patient 50 - 74%     Locomotion Ambulation   Ambulation assist    Ambulation activity did not occur: Safety/medical concerns          Walk 10 feet activity   Assist  Walk 10 feet activity did not occur: Safety/medical concerns        Walk 50 feet activity   Assist Walk 50 feet with 2 turns activity did not occur: Safety/medical concerns         Walk 150 feet activity   Assist Walk 150 feet activity did not occur: Safety/medical concerns         Walk 10 feet on uneven surface  activity   Assist Walk 10 feet on uneven surfaces activity did not occur: Safety/medical concerns         Wheelchair     Assist Is the patient using a wheelchair?: Yes Type of Wheelchair: Manual Wheelchair activity did not occur: Safety/medical concerns  Wheelchair assist level: Supervision/Verbal cueing Max wheelchair distance: 154ft    Wheelchair 50 feet with 2 turns activity    Assist    Wheelchair 50 feet with 2 turns activity did not occur: Safety/medical concerns   Assist Level: Supervision/Verbal cueing   Wheelchair 150 feet activity     Assist  Wheelchair 150 feet activity did not occur: Safety/medical concerns   Assist Level: Minimal Assistance - Patient > 75%   Blood pressure 107/73, pulse 60, temperature 98.3 F (36.8 C), temperature source Oral, resp. rate 18, height 5\' 10"  (1.778 m), weight 72 kg, SpO2 96 %.  Medical Problem List and Plan: 1.  Decline in mobility and ADLs secondary to LLE radiculopathy resulting in septic lumbar discitis with LE weakness/myelopathy             -patient may  shower             -ELOS/Goals: 14-18d  Con't PT and OT/CIR-  2.  Antithrombotics: -DVT/anticoagulation:  Mechanical: Sequential compression devices, below knee Bilateral lower extremities  10/19- surgery was 10/11- been 8 days- will double check with [facility] to see if can start Lovenox- Dr 12/11 agreed- so will start  10/20- educated pt on reason for lovenox             -antiplatelet therapy: N/A 3. Pain Management: still  has stabbing/burning bilateral hip/pelvic pain.  Continue robaxin 1000 mg TID.              --MS contin was added 10/17 in addition to prn oxycodone             --Will change home prn gabapentin 800 mg/bedtime for sleep and add gabapentin 100 mg bid during the day for neuropathic pain.              --Cymbalta? For mood and pain. 10/19- changed MS contin to  Exalgo 16 mg nightly and changed Oxycodone to 15 mg q3 hours prn- he notes makes him sleepy, but refuses changes right now - also did Toradol yesterday- will change to Diclofenac 75 mg BID as well.   10/20- will change Oxycodone to 20 mg q3 hours prn- and change Exalgo- in the next 1-2 days.  4. Mood: LCSW to follow for evaluation and support. Team to provide ego support.              -antipsychotic agents: N/a 5. Neuropsych: This patient is capable of making decisions on his own behalf. 6. Skin/Wound Care: Routine pressure relief measures.  7. Fluids/Electrolytes/Nutrition: Monitor I/O. Check lytes in am.  8. 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 --Antibiotics to continue for 6 weeks? -lumbar wound cultures without growth.   10/19- ID saw today- will need IV ABX for 8 weeks from 10/11- surgery date- changed Vanc to IV Dapto and con't Rifampin- and check labs weekly.  9. Low grade fevers: T-max 99.2 noted at nights and managed with prn tylenol.   10. T2DM with peripheral neuropathy/ Hgb A1c-8.4  (was 10.2  01/2021). Will monitor BS ac/hs  --ON insulin glargine  8 units and metformin daily.             --Use SSI for elevated BS/tighter control to help promote wound healing.   10/19- BG's usually controlled 1 episode of 247- but otherwise 130s-150s- con't regimen 11. Episodic Hypotension: Has resolved. Continue to monitor BP TID.  12. Hyponatremia: Monitor as continue to vary from 130-133 13. Acute blood loss anemia: Monitor H/H for trend/recovery. 14. Leucocytosis: Monitor for fevers and other  signs of worsening of infection             --monitor wounds for healing.  leared by Neurosurgery and hospitalist for rehab transfer   10/19- WBC down to 11k- will check weekly and more often if fevers worse.  15. Low protein stores:Continue prosource BID. Ensure max once daily.              --will add vitamins/Zinc also.     I spent a total of 25 minutes on total care- >50% coordination of care- doing IPOC and d/w pt about his total care.   LOS: 3 days A FACE TO FACE EVALUATION WAS PERFORMED  Quintasha Gren 06/14/2021, 9:16 AM

## 2021-06-14 NOTE — IPOC Note (Signed)
Overall Plan of Care [facility]) Patient Details Name: John Ware MRN: [id number] DOB: June 20, 1966  Admitting Diagnosis: Septic discitis of lumbar region  Hospital Problems: Principal Problem:   Septic discitis of lumbar region     Functional Problem List: Nursing Edema, Endurance, Medication Management, Pain, Safety, Skin Integrity, Sensory  PT Balance, Safety, Behavior, Sensory, Endurance, Skin Integrity, Motor, Pain  OT Balance, Endurance, Motor, Pain, Safety, Sensory  SLP    TR         Basic ADL's: OT Grooming, Bathing, Dressing, Toileting     Advanced  ADL's: OT       Transfers: PT Bed Mobility, Bed to Chair, Customer service manager, Tub/Shower     Locomotion: PT Ambulation, Psychologist, prison and probation services, Stairs     Additional Impairments: OT None, Fuctional Use of Upper Extremity  SLP        TR      Anticipated Outcomes Item Anticipated Outcome  Self Feeding no goal  Swallowing      Basic self-care  Supervision - Min A  Toileting  Supervision   Bathroom Transfers Supervision  Bowel/Bladder  n/a  Transfers  supervision  Locomotion  supervision w/c, CGA gait  Communication     Cognition     Pain  < 3  Safety/Judgment  min assist with no falls   Therapy Plan: PT Intensity: Minimum of 1-2 x/day ,45 to 90 minutes PT Frequency: 5 out of 7 days PT Duration Estimated Length of Stay: 14-16 OT Intensity: Minimum of 1-2 x/day, 45 to 90 minutes OT Frequency: 5 out of 7 days OT Duration/Estimated Length of Stay: 14-16 days     Due to the current state of emergency, patients may not be receiving their 3-hours of Medicare-mandated therapy.   Team Interventions: Nursing Interventions Patient/Family Education, Disease Management/Prevention, Pain Management, Medication Management, Skin Care/Wound Management, Discharge Planning  PT interventions Ambulation/gait training, Cognitive remediation/compensation, Discharge planning, DME/adaptive equipment instruction,  Functional mobility training, Pain management, Psychosocial support, Splinting/orthotics, Therapeutic Activities, UE/LE Strength taining/ROM, Visual/perceptual remediation/compensation, Warden/ranger, Disease management/prevention, Community reintegration, Development worker, international aid stimulation, Neuromuscular re-education, Patient/family education, Skin care/wound management, Stair training, Therapeutic Exercise, UE/LE Coordination activities, Wheelchair propulsion/positioning  OT Interventions Warden/ranger, Discharge planning, Pain management, Self Care/advanced ADL retraining, Therapeutic Activities, UE/LE Coordination activities, Visual/perceptual remediation/compensation, Therapeutic Exercise, Skin care/wound managment, Patient/family education, Functional mobility training, Disease mangement/prevention, Firefighter, Fish farm manager, Neuromuscular re-education, Psychosocial support, UE/LE Strength taining/ROM, Wheelchair propulsion/positioning, Splinting/orthotics  SLP Interventions    TR Interventions    SW/CM Interventions Discharge Planning, Psychosocial Support, Patient/Family Education   Barriers to Discharge MD  Medical stability, Home enviroment access/loayout, IV antibiotics, Wound care, Lack of/limited family support, Weight, Weight bearing restrictions, and Behavior  Nursing Decreased caregiver support, Home environment access/layout, Wound Care, Lack of/limited family support, Weight bearing restrictions, Medication compliance Lives in 1 level home with level entry. Lives with spouse who works from home and can provide supervision to light min assist.  PT Decreased caregiver support, Home environment access/layout, IV antibiotics, Wound Care    OT      SLP      SW       Team Discharge Planning: Destination: PT-Home ,OT- Home , SLP-  Projected Follow-up: PT-Home health PT, OT-  Home health OT, SLP-  Projected Equipment Needs:  PT-Rolling walker with 5" wheels, OT- To be determined, SLP-  Equipment Details: PT- , OT-  Patient/family involved in discharge planning: PT- Patient,  OT-Patient, SLP-   MD ELOS: 14-16 days Medical  Rehab Prognosis:  Good Assessment: Pt is a 55 yr old male with hx of chronic back pain- with lumbar discitis/osteomyelitis- with radiculopathy and myelopathy due to discitis, etc.  Also acute on chronic pain- and PICC line for MSSA Bacteremia-  On IV ABX til December-  Goals supervision    See Team Conference Notes for weekly updates to the plan of care

## 2021-06-14 NOTE — Progress Notes (Signed)
Physical Therapy Session Note  Patient Details  Name: John Ware MRN: [id number] Date of Birth: 01/11/1966  Today's Date: 06/14/2021 PT Individual Time: 0800-0900, 1503-1530 PT Individual Time Calculation (min): 60 min, 27 min  Short Term Goals: Week 1:  PT Short Term Goal 1 (Week 1): Pt will initate gait training PT Short Term Goal 2 (Week 1): Pt will self-propel w/c x 100 ft PT Short Term Goal 3 (Week 1): Pt will perform STS with min A  Skilled Therapeutic Interventions/Progress Updates:    Session 1: pt received in bed and agreeable to therapy. Pt reports 7/10 pain, premedicated. Rest breaks and positioning as needed. Nsg present to assist pt to bathroom, stedy transfer to commode. Supervision for 3/3 toileting tasks.  Stand pivot transfer to w/c with mod A for balance and power up to stand. Hand hygiene and oral care at w/c level. Pt propelled w/c with BUE for endurance and functional mobility. Session focused on progression of Sit to stand to gait in // bars. Pt stood in bars with min A to power up and assist to maintain knee alignment. Pt then progressed to shifting weight between feet with no incidence of knee buckling. Progressed to marching in place with no incidence of knee buckling. Pt then walked 2 x 6 ft, forward and back in bars with assist for knee block and balance. Seated rest break between each progression. Pt returned to room after session and remained in w/c, was left with all needs in reach and alarm active.   Session 2:  Pt seated in w/c on arrival and agreeable to therapy. Pt states his pain is "good right now" and he is premedicated. Reported some increase with activity, unrated, rest breaks as needed. Pt propelled w/c with BUE for endurance and functional mobility. Pt then directed in anterior weight shift activity, reaching toward mat table. Pt was unable to clear hips without mod A. Unsure if limited by true fatigue/weakness, fear, or pt beliefs, as he reported  that he feels he loses all strength after the first session, despite walking later in this session. Several repetitions with assist to help pt feel proper technique. Pt then stood at walker x 2 with mod A to power up and knee block for safety. Pt then ambulated x 18 ft, x20 ft with RW and close w/c follow. Pt demoes short step length, flexed trunk, poor foot clearance. Cues for proximity to RW and upright posture with min improvement. Pt returned to room and remained in w/c, was left with all needs in reach and alarm active.   Therapy Documentation Precautions:  Precautions Precautions: Fall, Back Precaution Comments: recalled 3/3 precautions Restrictions Weight Bearing Restrictions: No General:   Vital Signs: Therapy Vitals Temp: 98.3 F (36.8 C) Temp Source: Oral Pulse Rate: 60 Resp: 18 BP: 107/73 Patient Position (if appropriate): Lying Oxygen Therapy SpO2: 96 % O2 Device: Room Air Pain: Pain Assessment Pain Scale: 0-10 Pain Score: 7  Pain Type: Chronic pain Pain Location: Leg Pain Descriptors / Indicators: Stabbing Pain Frequency: Intermittent Pain Onset: On-going Patients Stated Pain Goal: 0 Pain Intervention(s): Repositioned;Medication (See eMAR) Multiple Pain Sites: No Mobility:   Locomotion :    Trunk/Postural Assessment :    Balance:   Exercises:   Other Treatments:      Therapy/Group: Individual Therapy  Juluis Rainier 06/14/2021, 8:10 AM

## 2021-06-14 NOTE — Progress Notes (Signed)
Occupational Therapy Session Note  Patient Details  Name: John Ware MRN: [id number] Date of Birth: Mar 06, 1966  Today's Date: 06/14/2021 OT Individual Time: 1638-4665 OT Individual Time Calculation (min): 40 min    Short Term Goals: Week 1:  OT Short Term Goal 1 (Week 1): Pt will perform stand pivot to BSC/toilet with LRAD Min A OT Short Term Goal 2 (Week 1): Pt will perform LB dress with Mod A and AE PRN OT Short Term Goal 3 (Week 1): Pt will perform sit <> stands in prep for ADL with Min A at LRAD OT Short Term Goal 4 (Week 1): Pt will increase AROM for shoulder flexion to 90* in B shoulders  Skilled Therapeutic Interventions/Progress Updates:  Pt greeted seated in w/c  agreeable to OT intervention. Session focus on functional reach from sitting/ standing in prep for higher level dynamic reaching tasks during ADLs. Pt completed w/c propulsion from room to gym with supervision. Pt completed stand pivot transfer from w/c>EOM with MOD A with RW. Pt completed dynamic reaching task in sitting to promote shoulder AROM from EOM where pt instructed to reach across midline to retrieve blocks to construct figure based on visual aid provided. Pt completed task with supervision reaching across with RUE and using LUE to construct figure on table provided. Pt required seated rest break post activity d/t pain. Pt able to complete task from standing with pt sit<>stand with MOD A with RW, pt able to reach dynamically with RUE to retrieve cards placed on mirror and return cards to table on R side with MIN A for balance. Pt able to stand ~ 1 min before needing to sit. Pt returned to w/c via stand pivot transfer from EOM>w/c with rw and MOD A. Pt transported back to room with total A d/t fatigue. Pt requested to return to bed post session with pt completed additional stand pivot transfer from w/c>EOB with MOD A with RW. MINA for supine>sitting using log roll technique.    pt left  supine in bed with RN present.                       Therapy Documentation Precautions:  Precautions Precautions: Fall, Back Precaution Comments: recalled 3/3 precautions Restrictions Weight Bearing Restrictions: No  Pain: pt reports initial 4/10 pain across bilateral hips, pain increased to 7/10 with mobility. Rest breaks provided as needed and offered heat/ice with pt declining.     Therapy/Group: Individual Therapy  John Ware 06/14/2021, 12:41 PM

## 2021-06-15 DIAGNOSIS — M4646 Discitis, unspecified, lumbar region: Secondary | ICD-10-CM | POA: Diagnosis not present

## 2021-06-15 DIAGNOSIS — D62 Acute posthemorrhagic anemia: Secondary | ICD-10-CM

## 2021-06-15 DIAGNOSIS — E1142 Type 2 diabetes mellitus with diabetic polyneuropathy: Secondary | ICD-10-CM

## 2021-06-15 DIAGNOSIS — R7881 Bacteremia: Secondary | ICD-10-CM

## 2021-06-15 DIAGNOSIS — G8918 Other acute postprocedural pain: Secondary | ICD-10-CM

## 2021-06-15 LAB — GLUCOSE, CAPILLARY
Glucose-Capillary: 103 mg/dL — ABNORMAL HIGH (ref 70–99)
Glucose-Capillary: 112 mg/dL — ABNORMAL HIGH (ref 70–99)
Glucose-Capillary: 140 mg/dL — ABNORMAL HIGH (ref 70–99)
Glucose-Capillary: 88 mg/dL (ref 70–99)

## 2021-06-15 NOTE — Progress Notes (Signed)
Physical Therapy Session Note  Patient Details  Name: John Ware MRN: [id number] Date of Birth: [birth year]  Today's Date: 06/15/2021 PT Individual Time: 0805-0900 PT Individual Time Calculation (min): 55 min   Short Term Goals: Week 1:  PT Short Term Goal 1 (Week 1): Pt will initate gait training PT Short Term Goal 2 (Week 1): Pt will self-propel w/c x 100 ft PT Short Term Goal 3 (Week 1): Pt will perform STS with min A  Skilled Therapeutic Interventions/Progress Updates: Pt presented in bed with nsg present administering am meds. Pt agreeable to therapy. Pt states pain 6/10 currently. Once received meds performed supine to sit with CGA, use of bed features and increased time. Performed stand pivot transfer to w/c with RW from elevated bed with minA for Sit to stand and CGA for step pivot. PTA donned shoes total A for time management and pt propelled to day room ~113f with supervision. Pt preparing for transfer to NuStep and the indicated need for BM. Transported back to room and pt performed stand pivot to toilet with use of wall rail (+bm). Once completed pt performed Sit to stand with modA from toilet and PTA used washcloth for peri-care to ensure cleanliness. Pt then propelled back to dayroom and performed stand pivot transfer with RW and minA. Pt participated in NuStep L3 x 8 minutes for global conditioning. Pt reutrned to w/c in same manner as prior. Pt propelled back to room and remained in w/c at end of session with belt alarm on, call bell within reach and needs met.      Therapy Documentation Precautions:  Precautions Precautions: Fall, Back Precaution Comments: recalled 3/3 precautions Restrictions Weight Bearing Restrictions: No General:   Vital Signs: Therapy Vitals Temp: 97.7 F (36.5 C) Temp Source: Oral Pulse Rate: 93 Resp: 16 BP: 100/69 Patient Position (if appropriate): Sitting Oxygen Therapy SpO2: 99 % O2 Device: Room Air Pain: Pain Assessment Pain  Scale: 0-10 Pain Score: 4  Pain Type: Chronic pain Pain Location: Hip Pain Orientation: Right Pain Descriptors / Indicators: Aching;Sharp Pain Frequency: Constant Pain Onset: On-going Patients Stated Pain Goal: 0 Pain Intervention(s): Medication (See eMAR) Multiple Pain Sites: No Mobility:   Locomotion :    Trunk/Postural Assessment :    Balance:   Exercises:   Other Treatments:      Therapy/Group: Individual Therapy  Adahlia Stembridge 06/15/2021, 4:09 PM

## 2021-06-15 NOTE — Progress Notes (Signed)
Occupational Therapy Session Note  Patient Details  Name: John Ware MRN: [id number] Date of Birth: [birth year]  Today's Date: 06/16/2021 OT Individual Time: 1000-1056 and 1308-6578 OT Individual Time Calculation (min): 56 min and 32 min   Short Term Goals: Week 1:  OT Short Term Goal 1 (Week 1): Pt will perform stand pivot to BSC/toilet with LRAD Min A OT Short Term Goal 2 (Week 1): Pt will perform LB dress with Mod A and AE PRN OT Short Term Goal 3 (Week 1): Pt will perform sit <> stands in prep for ADL with Min A at LRAD OT Short Term Goal 4 (Week 1): Pt will increase AROM for shoulder flexion to 90* in B shoulders  Skilled Therapeutic Interventions/Progress Updates:    Pt greeted in the w/c, reporting 5/10 pain in back. Pt provided rest breaks during session to manage pain and RN provided oxy during session. Pt wanted to start by brushing his teeth at the sink, opting to sit vs stand due to pain. Setup for oral care and then pt retrieved several pieces of AE to work on adaptive dressing skills. Discussed how using AE can assist with improving pain while engaging in self care. Pt already owns shoe horn and sock aide. Education provided on how to use reacher, sock aide, shoe horn, and shoe funnel, pt having hands on practice with all equipment. He reported really liking the shoe funnel, texted wife picture and description during tx. His wife arrived near end of session, we discussed bathroom setup and d/c needs. Pt already has a handicapped height toilet, has a seat for the tub shower. Showed both pt and spouse picture of TTB and described transfer technique. Both want this equipment to use at home instead, pt will need to practice simulated tub shower transfer using TTB at a later OT session. Pt was escorted to dance group at close of session.   2nd Session 1:1 tx (32 min) Pt greeted in the w/c and premedicated for pain. He wanted to change his clothing. Worked on adaptive self care  skills for back precaution adherence, sit<stands, and dynamic standing balance while doffing/donning clothes. Min A for sit<stands using RW with vcs for proper hand placement. Min A for dynamic standing balance. Setup for UB dressing, vcs and Min A for LB dressing, pt using the reacher, sock aide, and shoe funnel as needed, dressing the weaker Lt LE first with cues. He remained sitting in the w/c at close of session, all needs within reach and safety belt fastened.   OT also issued him some elastic shoe laces for sneakers   Therapy Documentation Precautions:  Precautions Precautions: Fall, Back Precaution Comments: recalled 3/3 precautions Restrictions Weight Bearing Restrictions: No Pain: Pain Assessment Pain Score: 5  ADL: ADL Eating: Set up Grooming: Setup Upper Body Bathing: Minimal assistance Lower Body Bathing: Dependent Upper Body Dressing: Minimal assistance Lower Body Dressing: Dependent (simulated) Toileting: Maximal assistance (simulated) Toilet Transfer: Moderate assistance Toilet Transfer Method: Stand pivot  Therapy/Group: Individual Therapy  Tariyah Pendry A Xaniyah Buchholz 06/16/2021, 12:44 PM

## 2021-06-15 NOTE — Progress Notes (Signed)
Physical Therapy Session Note  Patient Details  Name: John Ware MRN: [id number] Date of Birth: 1966-03-27  Today's Date: 06/15/2021 PT Individual Time: 1000-1044 PT Individual Time Calculation (min): 44 min   Short Term Goals: Week 1:  PT Short Term Goal 1 (Week 1): Pt will initate gait training PT Short Term Goal 2 (Week 1): Pt will self-propel w/c x 100 ft PT Short Term Goal 3 (Week 1): Pt will perform STS with min A  Skilled Therapeutic Interventions/Progress Updates:    Pt seated in w/c on arrival and agreeable to therapy. Pt reports that his pain is "ok" today, but that he is due for pain medication. RN contacted to request medication, not administered during session. Rest breaks and psychosocial support provided throughout. Pt stood from w/c to RW with mod A throughout session, requiring verbal cues <50% of the time for sequencing and anterior weight shift. Pt propelled w/c with BUE for endurance and functional mobility. Pt then participated in TUG test with RW and mod A to stand, x 3 timed trails and x 1 practice trial. Results documented below, pt demonstrates significant fall risk. During rest breaks, pt demonstrated self-limiting behavior and thought processes. Discussed benefit of using positive language and thinking for holistic improvement in mood and physical strength. Pt required max verbal encouragement for all mobility throughout session. Gait training x 75 ft. Pt demoes anterior trunk lean and incr distance to RW, when posititioning walker in appropriate position, pt reports increased back pain. Pt educated on importance of RW proximity to prevent falls, minimally receptive. Assessed walker height, found it to be appropriate if Pt is standing completely upright, but d/t kyphosis could be lowered slightly. At this point pt stated "I'm finished" and refused further treatment d/t fatigue and pain. Pt missed 16 minutes of scheduled PT. Pt returned to room and performed mod A Stand  pivot transfer to bed, supervision bed mobility. Pt remained in bed and was left with all needs in reach and alarm active.   Therapy Documentation Precautions:  Precautions Precautions: Fall, Back Precaution Comments: recalled 3/3 precautions Restrictions Weight Bearing Restrictions: No General: PT Amount of Missed Time (min): 16 Minutes PT Missed Treatment Reason: Patient fatigue;Pain  Balance: Balance Balance Assessed: Yes Standardized Balance Assessment Standardized Balance Assessment: Timed Up and Go Test Timed Up and Go Test TUG: Normal TUG Normal TUG (seconds): 75 (fall risk, with RW and mod A to stand)     Therapy/Group: Individual Therapy  Juluis Rainier 06/15/2021, 10:49 AM

## 2021-06-15 NOTE — Progress Notes (Signed)
PROGRESS NOTE   Subjective/Complaints: Patient seen sitting up in bed this morning.  He states he slept very well overnight.  He denies complaints.  ROS: Denies CP, SOB, N/V/D  Objective:   No results found. No results for input(s): WBC, HGB, HCT, PLT in the last 72 hours.  No results for input(s): NA, K, CL, CO2, GLUCOSE, BUN, CREATININE, CALCIUM in the last 72 hours.   Intake/Output Summary (Last 24 hours) at 06/15/2021 1139 Last data filed at 06/15/2021 0815 Gross per 24 hour  Intake 863.06 ml  Output 1375 ml  Net -511.94 ml      Pressure Injury 06/11/21 Buttocks Left Stage 1 -  Intact skin with non-blanchable redness of a localized area usually over a bony prominence. (Active)  06/11/21 1617  Location: Buttocks  Location Orientation: Left  Staging: Stage 1 -  Intact skin with non-blanchable redness of a localized area usually over a bony prominence.  Wound Description (Comments):   Present on Admission: Yes     Pressure Injury 06/11/21 Buttocks Right Stage 1 -  Intact skin with non-blanchable redness of a localized area usually over a bony prominence. (Active)  06/11/21 1618  Location: Buttocks  Location Orientation: Right  Staging: Stage 1 -  Intact skin with non-blanchable redness of a localized area usually over a bony prominence.  Wound Description (Comments):   Present on Admission: Yes    Physical Exam: Vital Signs Blood pressure 119/78, pulse 61, temperature 97.9 F (36.6 C), resp. rate 16, height 5\' 10"  (1.778 m), weight 72 kg, SpO2 97 %. Constitutional: No distress . Vital signs reviewed. HENT: Normocephalic.  Atraumatic. Eyes: EOMI. No discharge. Cardiovascular: No JVD.  RRR. Respiratory: Normal effort.  No stridor.  Bilateral clear to auscultation. GI: Non-distended.  BS +. Skin: Warm and dry.  Intact. Psych: Normal mood.  Normal behavior. Musc: No edema in extremities.  No tenderness in  extremities. Neuro: Alert Bilateral upper extremities: 5/5 proximal distal Left lower extremity: Hip flexion, knee extension 0/5, ankle dorsiflexion 4-/5  Assessment/Plan: 1. Functional deficits which require 3+ hours per day of interdisciplinary therapy in a comprehensive inpatient rehab setting. Physiatrist is providing close team supervision and 24 hour management of active medical problems listed below. Physiatrist and rehab team continue to assess barriers to discharge/monitor patient progress toward functional and medical goals  Care Tool:  Bathing    Body parts bathed by patient: Right arm, Left arm, Chest, Abdomen, Right upper leg, Left upper leg, Face, Front perineal area, Buttocks   Body parts bathed by helper: Right lower leg, Left lower leg     Bathing assist Assist Level: Minimal Assistance - Patient > 75%     Upper Body Dressing/Undressing Upper body dressing   What is the patient wearing?: Pull over shirt    Upper body assist Assist Level: Set up assist    Lower Body Dressing/Undressing Lower body dressing      What is the patient wearing?: Pants, Underwear/pull up     Lower body assist Assist for lower body dressing: Moderate Assistance - Patient 50 - 74%     Toileting Toileting    Toileting assist Assist for toileting: Moderate Assistance -  Patient 50 - 74%     Transfers Chair/bed transfer  Transfers assist     Chair/bed transfer assist level: Moderate Assistance - Patient 50 - 74%     Locomotion Ambulation   Ambulation assist   Ambulation activity did not occur: Safety/medical concerns          Walk 10 feet activity   Assist  Walk 10 feet activity did not occur: Safety/medical concerns        Walk 50 feet activity   Assist Walk 50 feet with 2 turns activity did not occur: Safety/medical concerns         Walk 150 feet activity   Assist Walk 150 feet activity did not occur: Safety/medical concerns         Walk  10 feet on uneven surface  activity   Assist Walk 10 feet on uneven surfaces activity did not occur: Safety/medical concerns         Wheelchair     Assist Is the patient using a wheelchair?: Yes Type of Wheelchair: Manual Wheelchair activity did not occur: Safety/medical concerns  Wheelchair assist level: Supervision/Verbal cueing Max wheelchair distance: 140ft    Wheelchair 50 feet with 2 turns activity    Assist    Wheelchair 50 feet with 2 turns activity did not occur: Safety/medical concerns   Assist Level: Supervision/Verbal cueing   Wheelchair 150 feet activity     Assist  Wheelchair 150 feet activity did not occur: Safety/medical concerns   Assist Level: Minimal Assistance - Patient > 75%   Blood pressure 119/78, pulse 61, temperature 97.9 F (36.6 C), resp. rate 16, height 5\' 10"  (1.778 m), weight 72 kg, SpO2 97 %.  Medical Problem List and Plan: 1.  Decline in mobility and ADLs secondary to LLE radiculopathy resulting in septic lumbar discitis with LE weakness/myelopathy  Continue CIR 2.  Antithrombotics: -DVT/anticoagulation:  Mechanical: Sequential compression devices, below knee Bilateral lower extremities  Lovenox             -antiplatelet therapy: N/A 3. Pain Management: still has stabbing/burning bilateral hip/pelvic pain.  Continue robaxin 1000 mg TID.              --MS contin was added 10/17 in addition to prn oxycodone             --Changed home prn gabapentin 800 mg/bedtime for sleep and added gabapentin 100 mg bid during the day for neuropathic pain.              --Cymbalta? For mood and pain. Changed MS contin to Exalgo 16 mg nightly and changed Oxycodone to 15 mg q3 hours, changed to Diclofenac 75 mg BID as well.  Controlled with meds on 10/21 4. Mood: LCSW to follow for evaluation and support. Team to provide ego support.              -antipsychotic agents: N/a 5. Neuropsych: This patient is capable of making decisions on his own  behalf. 6. Skin/Wound Care: Routine pressure relief measures.  7. Fluids/Electrolytes/Nutrition: Monitor I/Os 8. 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 --Antibiotics to continue for 6 weeks? -lumbar wound cultures without growth.  10/19- ID - will need IV Abx for 8 weeks from 10/11- surgery date- changed Vanc to IV Dapto and con't Rifampin 9. Low grade fevers: Resolved 10. T2DM with peripheral neuropathy/ Hgb A1c-8.4  (was 10.2  01/2021). Will monitor BS ac/hs  --ON insulin glargine  8 units and metformin daily.             --  Use SSI for elevated BS/tighter control to help promote wound healing.   Controlled on 10/21 11. Episodic Hypotension: Has resolved. Continue to monitor BP TID.  12. Hyponatremia: Monitor as continue to vary from 130-133 13. Acute blood loss anemia: Monitor H/H for trend/recovery.  Hemoglobin 11.5 on 10/18 14. Leucocytosis: Monitor for fevers and other signs of worsening of infection  WBCs 11.7 on 10/18             --monitor wounds for healing.  15. Low protein stores:Continue prosource BID. Ensure max once daily.              --will add vitamins/Zinc also.    LOS: 4 days A FACE TO FACE EVALUATION WAS PERFORMED  John Ware Karis Juba 06/15/2021, 11:39 AM

## 2021-06-16 DIAGNOSIS — M4646 Discitis, unspecified, lumbar region: Secondary | ICD-10-CM | POA: Diagnosis not present

## 2021-06-16 LAB — GLUCOSE, CAPILLARY
Glucose-Capillary: 98 mg/dL (ref 70–99)
Glucose-Capillary: 125 mg/dL — ABNORMAL HIGH (ref 70–99)
Glucose-Capillary: 101 mg/dL — ABNORMAL HIGH (ref 70–99)
Glucose-Capillary: 136 mg/dL — ABNORMAL HIGH (ref 70–99)

## 2021-06-16 MED ORDER — ALTEPLASE 2 MG IJ SOLR
2.0000 mg | Freq: Once | INTRAMUSCULAR | Status: DC
  Filled 2021-06-16: qty 2

## 2021-06-16 NOTE — Progress Notes (Signed)
Physical Therapy Session Note  Patient Details  Name: John Ware MRN: [id number] Date of Birth: [birth year]  Today's Date: 06/16/2021 PT Individual Time: 1400-1430 and 800-858 PT Individual Time Calculation (min): 30 min and 58 min  Short Term Goals: Week 1:  PT Short Term Goal 1 (Week 1): Pt will initate gait training PT Short Term Goal 2 (Week 1): Pt will self-propel w/c x 100 ft PT Short Term Goal 3 (Week 1): Pt will perform STS with min A Week 2:    Week 3:     Skilled Therapeutic Interventions/Progress Updates:   PAIN prior to session reports 7/10 hips.  At time of session after meds 5/10 hips. Therapist checKed on pt prior to session, contacted nursing prior to session to ensure premedicated d/t c/o pain.  Pt supine and agreeable to session. Pt supine to sit w/min assist, additional time use of  rails.  Sat several min at edge of bed w/supervision. stand pivot transfer to wc w/min assist, bed elevated.  Pt propels to dayroom including negotiation of tight spaces in room mod I. Pt requested to defer standing/gait this am d/t fatigue but requests to use Nustep for global strengthening. stand pivot transfer wc to Nustep w/min assist including Sit to stand from wc height.   NuStep x 11 min total steps 533 L2 resistance  Wc propulsion/retropulsion x 27ft for hasmsting/quad strengthing Repeated Sit to stand from wc x 2 reps w/min assist  Wc propulsion mod I to room x >163ft Seated hip abduction     PM session PAIN 3/10 except when spasm occurred, vbriefly 8/10 R hip during spasm.  Treatment to tolerance.  Pt initially oob in wc.  Agreeable to 30 min session.  Wc propulsion x 179ft mod I to gym.  Sit to stand in parallel bars w/min assist and use of parallel bars to come to stand.  Gait in parallel bars: 23ft forward and back x 2 for total distance of 33ft w/therapist guarding knees but no buckling occured, ant lean of trunk d/t pain in low back/hip when attempting to  stand uprgith.    Pt rested several min then repeated gait as above.    Wc propulsion x 168ft to room.  Pt left oob in wc w/alarm belt set and needs in reach    Therapy Documentation Precautions:  Precautions Precautions: Fall, Back Precaution Comments: recalled 3/3 precautions Restrictions Weight Bearing Restrictions: No     Therapy/Group: Individual Therapy Rada Hay, PT   Shearon Balo 06/16/2021, 3:28 PM

## 2021-06-16 NOTE — Progress Notes (Signed)
PROGRESS NOTE   Subjective/Complaints: No new complaints Tolerating therapy well Premedicated for pain  ROS: Denies CP, SOB, N/V/D  Objective:   No results found. No results for input(s): WBC, HGB, HCT, PLT in the last 72 hours.  No results for input(s): NA, K, CL, CO2, GLUCOSE, BUN, CREATININE, CALCIUM in the last 72 hours.   Intake/Output Summary (Last 24 hours) at 06/16/2021 2029 Last data filed at 06/16/2021 1951 Gross per 24 hour  Intake 1299.17 ml  Output 1800 ml  Net -500.83 ml     Pressure Injury 06/11/21 Buttocks Left Stage 1 -  Intact skin with non-blanchable redness of a localized area usually over a bony prominence. (Active)  06/11/21 1617  Location: Buttocks  Location Orientation: Left  Staging: Stage 1 -  Intact skin with non-blanchable redness of a localized area usually over a bony prominence.  Wound Description (Comments):   Present on Admission: Yes     Pressure Injury 06/11/21 Buttocks Right Stage 1 -  Intact skin with non-blanchable redness of a localized area usually over a bony prominence. (Active)  06/11/21 1618  Location: Buttocks  Location Orientation: Right  Staging: Stage 1 -  Intact skin with non-blanchable redness of a localized area usually over a bony prominence.  Wound Description (Comments):   Present on Admission: Yes    Physical Exam: Vital Signs Blood pressure 108/74, pulse 74, temperature 98.3 F (36.8 C), resp. rate 18, height 5\' 10"  (1.778 m), weight 72 kg, SpO2 100 %. Gen: no distress, normal appearing HEENT: oral mucosa pink and moist, NCAT Cardio: Reg rate Chest: normal effort, normal rate of breathing Abd: soft, non-distended Ext: no edema Psych: pleasant, normal affect Skin: Warm and dry.  Intact. Psych: Normal mood.  Normal behavior. Musc: No edema in extremities.  No tenderness in extremities. Neuro: Alert Bilateral upper extremities: 5/5 proximal  distal Left lower extremity: Hip flexion, knee extension 0/5, ankle dorsiflexion 4-/5  Assessment/Plan: 1. Functional deficits which require 3+ hours per day of interdisciplinary therapy in a comprehensive inpatient rehab setting. Physiatrist is providing close team supervision and 24 hour management of active medical problems listed below. Physiatrist and rehab team continue to assess barriers to discharge/monitor patient progress toward functional and medical goals  Care Tool:  Bathing    Body parts bathed by patient: Right arm, Left arm, Chest, Abdomen, Right upper leg, Left upper leg, Face, Front perineal area, Buttocks   Body parts bathed by helper: Right lower leg, Left lower leg     Bathing assist Assist Level: Minimal Assistance - Patient > 75%     Upper Body Dressing/Undressing Upper body dressing   What is the patient wearing?: Pull over shirt    Upper body assist Assist Level: Set up assist    Lower Body Dressing/Undressing Lower body dressing      What is the patient wearing?: Pants, Underwear/pull up     Lower body assist Assist for lower body dressing: Moderate Assistance - Patient 50 - 74%     Toileting Toileting    Toileting assist Assist for toileting: Moderate Assistance - Patient 50 - 74%     Transfers Chair/bed transfer  Transfers assist  Chair/bed transfer assist level: Moderate Assistance - Patient 50 - 74%     Locomotion Ambulation   Ambulation assist   Ambulation activity did not occur: Safety/medical concerns          Walk 10 feet activity   Assist  Walk 10 feet activity did not occur: Safety/medical concerns        Walk 50 feet activity   Assist Walk 50 feet with 2 turns activity did not occur: Safety/medical concerns         Walk 150 feet activity   Assist Walk 150 feet activity did not occur: Safety/medical concerns         Walk 10 feet on uneven surface  activity   Assist Walk 10 feet on  uneven surfaces activity did not occur: Safety/medical concerns         Wheelchair     Assist Is the patient using a wheelchair?: Yes Type of Wheelchair: Manual Wheelchair activity did not occur: Safety/medical concerns  Wheelchair assist level: Supervision/Verbal cueing Max wheelchair distance: 145ft    Wheelchair 50 feet with 2 turns activity    Assist    Wheelchair 50 feet with 2 turns activity did not occur: Safety/medical concerns   Assist Level: Supervision/Verbal cueing   Wheelchair 150 feet activity     Assist  Wheelchair 150 feet activity did not occur: Safety/medical concerns   Assist Level: Minimal Assistance - Patient > 75%   Blood pressure 108/74, pulse 74, temperature 98.3 F (36.8 C), resp. rate 18, height 5\' 10"  (1.778 m), weight 72 kg, SpO2 100 %.  Medical Problem List and Plan: 1.  Decline in mobility and ADLs secondary to LLE radiculopathy resulting in septic lumbar discitis with LE weakness/myelopathy  Continue CIR 2.  Antithrombotics: -DVT/anticoagulation:  Mechanical: Sequential compression devices, below knee Bilateral lower extremities  Lovenox             -antiplatelet therapy: N/A 3. Pain: still has stabbing/burning bilateral hip/pelvic pain.  Continue robaxin 1000 mg TID.              --MS contin was added 10/17 in addition to prn oxycodone             --Changed home prn gabapentin 800 mg/bedtime for sleep and added gabapentin 100 mg bid during the day for neuropathic pain.              --Cymbalta? For mood and pain. Changed MS contin to Exalgo 16 mg nightly and changed Oxycodone to 15 mg q3 hours, changed to Diclofenac 75 mg BID as well.  Controlled with meds on 10/22 4. Mood: LCSW to follow for evaluation and support. Team to provide ego support.              -antipsychotic agents: N/a 5. Neuropsych: This patient is capable of making decisions on his own behalf. 6. Skin/Wound Care: Routine pressure relief measures.  7.  Fluids/Electrolytes/Nutrition: Monitor I/Os 8. 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 --Antibiotics to continue for 6 weeks? -lumbar wound cultures without growth.  10/19- ID - will need IV Abx for 8 weeks from 10/11- surgery date- changed Vanc to IV Dapto and con't Rifampin 9. Low grade fevers: Resolved 10. T2DM with peripheral neuropathy/ Hgb A1c-8.4  (was 10.2  01/2021). Will monitor BS ac/hs  --ON insulin glargine  8 units and metformin daily.             --Use SSI for elevated BS/tighter control to  help promote wound healing.   Controlled on 10/22 11. Episodic Hypotension: Has resolved. Continue to monitor BP TID.  12. Hyponatremia: Monitor as continue to vary from 130-133 13. Acute blood loss anemia: Monitor H/H for trend/recovery.  Hemoglobin 11.5 on 10/18 14. Leucocytosis: Monitor for fevers and other signs of worsening of infection  WBCs 11.7 on 10/18             --monitor wounds for healing.  15. Low protein stores:Continue prosource BID. Ensure max once daily.              --Continue vitamins/Zinc also.    LOS: 5 days A FACE TO FACE EVALUATION WAS PERFORMED  John Ware 06/16/2021, 8:29 PM

## 2021-06-17 LAB — GLUCOSE, CAPILLARY
Glucose-Capillary: 104 mg/dL — ABNORMAL HIGH (ref 70–99)
Glucose-Capillary: 132 mg/dL — ABNORMAL HIGH (ref 70–99)
Glucose-Capillary: 167 mg/dL — ABNORMAL HIGH (ref 70–99)
Glucose-Capillary: 96 mg/dL (ref 70–99)

## 2021-06-17 NOTE — Progress Notes (Signed)
Pt complained of left upper thigh pain most of the night. Pt states that he has had muscle spasms in that area since he started therapy sessions and the spasms constantly bother him. Patient requested a muscle relaxer stating that tylenol and oxyxodone isn't a muscle relaxer and won't help with the spasms. Pt currently takes methocarbamol (ROBAXIN) 1,000 mg T.I.D.

## 2021-06-18 DIAGNOSIS — G8918 Other acute postprocedural pain: Secondary | ICD-10-CM | POA: Diagnosis not present

## 2021-06-18 DIAGNOSIS — M4646 Discitis, unspecified, lumbar region: Secondary | ICD-10-CM | POA: Diagnosis not present

## 2021-06-18 LAB — GLUCOSE, CAPILLARY
Glucose-Capillary: 115 mg/dL — ABNORMAL HIGH (ref 70–99)
Glucose-Capillary: 126 mg/dL — ABNORMAL HIGH (ref 70–99)
Glucose-Capillary: 173 mg/dL — ABNORMAL HIGH (ref 70–99)
Glucose-Capillary: 141 mg/dL — ABNORMAL HIGH (ref 70–99)

## 2021-06-18 LAB — CBC
HCT: 34 % — ABNORMAL LOW (ref 39.0–52.0)
Hemoglobin: 10.9 g/dL — ABNORMAL LOW (ref 13.0–17.0)
MCH: 26.2 pg (ref 26.0–34.0)
MCHC: 32.1 g/dL (ref 30.0–36.0)
MCV: 81.7 fL (ref 80.0–100.0)
Platelets: 502 10*3/uL — ABNORMAL HIGH (ref 150–400)
RBC: 4.16 MIL/uL — ABNORMAL LOW (ref 4.22–5.81)
RDW: 15.6 % — ABNORMAL HIGH (ref 11.5–15.5)
WBC: 9.8 10*3/uL (ref 4.0–10.5)
nRBC: 0 % (ref 0.0–0.2)

## 2021-06-18 LAB — BASIC METABOLIC PANEL
Anion gap: 10 (ref 5–15)
BUN: 7 mg/dL (ref 6–20)
CO2: 29 mmol/L (ref 22–32)
Calcium: 8.9 mg/dL (ref 8.9–10.3)
Chloride: 97 mmol/L — ABNORMAL LOW (ref 98–111)
Creatinine, Ser: 0.48 mg/dL — ABNORMAL LOW (ref 0.61–1.24)
GFR, Estimated: >60 mL/min (ref >60)
Glucose, Bld: 121 mg/dL — ABNORMAL HIGH (ref 70–99)
Potassium: 4.1 mmol/L (ref 3.5–5.1)
Sodium: 136 mmol/L (ref 135–145)

## 2021-06-18 LAB — C-REACTIVE PROTEIN: CRP: 7.6 mg/dL — ABNORMAL HIGH (ref <1.0)

## 2021-06-18 LAB — SEDIMENTATION RATE: Sed Rate: 55 mm/hr — ABNORMAL HIGH (ref 0–16)

## 2021-06-18 MED ORDER — MORPHINE SULFATE ER 15 MG PO TBCR
30.0000 mg | EXTENDED_RELEASE_TABLET | Freq: Two times a day (BID) | ORAL | Status: DC
  Administered 2021-06-18 – 2021-06-20 (×4): 30 mg via ORAL
  Filled 2021-06-18 (×4): qty 2

## 2021-06-18 NOTE — Consult Note (Signed)
Neuropsychological Consultation   Patient:   John Ware   DOB:   08/24/1966  MR Number:  [id number]  Location:  [facility] [address] CENTER A [address] [id number] [place] [address] Dept: [phone] Loc: [phone]           Date of Service:   06/18/2021  Start Time:   9 AM End Time:   10 AM  Provider/Observer:  Arley Phenix, Psy.D.       Clinical Neuropsychologist       Billing Code/Service: [phone]  Chief Complaint:    John Ware is a 55 year old male with past history of type 2 diabetes with neuropathy, hypertension, lumbar decompression in 2016 and admission in June through July for severe MSSA bacteremia with endocarditis, bilateral septic shoulder and multiple I&D's.  Large left posterior wall and bilateral psoas muscle abscess and osteomyelitis treated with IV antibiotics.  With recent presentation patient was found to have ongoing discitis and osteomyelitis at L1/L2 and L2/L3 with severe canal stenosis and compression cauda equina nerve root and other indications of infection between L1 and L5.  Neurosurgery was consulted and due to continued weakness patient elected to undergo decompression of left L1/2 and L2/3 on 10/11.  Patient with continued issues with pain and continues to have left lower extremity weakness with instability.  Reason for Service:  Patient referred for neuropsychological consultation due to coping and adjustment issues with extended hospital stay.  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 He was admitted on 05/28/21 with back pain and LLE weakness X one week and intermittent fevers. No bowel or bladder complaints. He was started on Cefazolin and MRI spine done revealing found to have ongoing diskitis with osteomyelitis at L1/L2 and L2/L3 with new/increased ventral epidural phlegmon centered at L1 with severe canal stenosis and compression of cauda equina nerve roots, phlegmon also seen along ventral aspect of L1 and L2 vertebral bodies, no significant change in fluid collection in soft tissues L4/L5 favored to be seroma and overall interval decrease in  size and number of bilateral psoas abscessed with small residual abscess and diffuse signal abnormality throughout the remainder of psoas and paraspinal musculature consistent with infectious/inflammatory myositis.     [name] consulted and recommended monitoring for neurological recovery/improvement. Patient continued to have weakness and pain therefore elected on undergoing decompression of left L1/2 and L2/3 dural tube and nerve roots of L1-L3 by [name] on 10/11.  No pus encountered and tissue sent for cultures. Post op has not had improvement in LLE motor recovery, had issues with low BP, acute blood loss anemia, labile BS as well as ongoing issues with back pain. MRI L spine repeated 10/15 revealing enhancing ventral collection at L1 and L2 similar in appearance without progression and interval decompression of L1/2 and L2/3. [name] has reviewed films--stenosis better and plans to follow up in a month on outpatient basis.  MS contin added today to help with pain control.  Repeat blood cultures negative, rifampin added on 10/14 and Cefazolin changed to Vancomycin  on 10/15 per [name] recommendations. Continues to have low grade fevers in evening and WBC on upward trend. Patient continues to have LLE weakness with instability , balance deficits and pain affecting ADLs and  mobility. CIR recommended due to functional decline.  Current Status:  Patient was received sitting up in his wheelchair awake and alert.  Patient had recently completed PT reported his pain was manageable during the session.  He was oriented with good mental status.  Patient reports that he been very frustrated with extended hospital stay and repeated issues with infection.  He talked about the issues of his infection in his shoulders and side during June and July and frustrations with reoccurrence of difficulties.  Patient denies an exacerbation of his depression and reports that while he is frustrated with everything that is going on he does not feel there has been a severe acute exacerbation of his depression but does feel down and frustrated.   Behavioral Observation: John Ware  presents as a 55 y.o.-year-old Right Caucasian Male who appeared his stated age. his dress was Appropriate and he was Well Groomed and his manners were Appropriate to the situation.  his participation was indicative of Appropriate and Redirectable behaviors.  There were physical disabilities noted.  he displayed an appropriate level of cooperation and motivation.     Interactions:    Active Appropriate  Attention:   within normal limits and attention span and concentration were age appropriate  Memory:   within normal limits; recent and remote memory intact  Visuo-spatial:  not examined  Speech (Volume):  low  Speech:   normal; normal  Thought Process:  Coherent and Relevant  Though Content:  WNL; not suicidal and not homicidal  Orientation:   person, place, time/date, and situation  Judgment:   Fair  Planning:   Fair  Affect:    Irritable  Mood:    Dysphoric  Insight:   Fair  Intelligence:   Average   Medical History:   Past Medical History:  Diagnosis Date   Depression    Diabetes mellitus without complication (HCC)    Hypertension    Spinal stenosis    With Neurogenic Claudication          Patient Active Problem List   Diagnosis Date Noted   Acute blood loss anemia    Diabetic peripheral neuropathy (HCC)    Bacteremia    Postoperative pain    Septic discitis of lumbar region 06/11/2021   Infection of spine (HCC) 05/28/2021   Medication monitoring encounter 05/09/2021   MSSA (methicillin susceptible Staphylococcus aureus) infection 05/09/2021   Abscess    History of COVID-19    Psoas abscess (HCC)    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   Right shoulder pain 02/14/2021   Chest wall abscess 02/14/2021   Obesity 02/14/2021   Normocytic anemia 02/14/2021   Thrombocytosis 02/14/2021   Endocarditis 02/05/2021   Vision changes 02/04/2021   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   Pressure injury of skin 02/01/2021   HTN (hypertension) 02/01/2021   Anxiety 02/01/2021   Left shoulder pain 02/01/2021   Hyponatremia 01/31/2021   Positive colorectal cancer screening using Cologuard test 12/28/2020   DOE (dyspnea on exertion) 10/20/2020   Diabetic polyneuropathy associated with type 2 diabetes mellitus (HCC) 10/09/2020   Diabetes mellitus (HCC) 10/30/2018   Lumbar stenosis with neurogenic claudication 10/14/2017   DDD (degenerative disc disease), lumbar 02/17/2017   Left leg weakness 02/05/2017   Hyperlipidemia associated with type 2  diabetes mellitus (HCC) 10/24/2016   Depression, recurrent (HCC) 10/24/2016   Right knee pain 05/21/2016   DDD (degenerative disc disease), lumbosacral 05/21/2016   Overweight (BMI 25.0-29.9) 05/21/2016   Lumbar stenosis 12/13/2014    Psychiatric History:  Patient with past history of depressive disorder diagnosed with recurrent major depression at least 01/12/2017.  Family Med/Psych History:  Family History  Problem Relation Age of Onset   Cancer Mother    Colon cancer Neg Hx     Impression/DX:  NABEEL GLADSON is a 55 year old male with past history of type 2 diabetes with neuropathy, hypertension, lumbar  decompression in 2016 and admission in June through July for severe MSSA bacteremia with endocarditis, bilateral septic shoulder and multiple I&D's.  Large left posterior wall and bilateral psoas muscle abscess and osteomyelitis treated with IV antibiotics.  With recent presentation patient was found to have ongoing discitis and osteomyelitis at L1/L2 and L2/L3 with severe canal stenosis and compression cauda equina nerve root and other indications of infection between L1 and L5.  Neurosurgery was consulted and due to continued weakness patient elected to undergo decompression of left L1/2 and L2/3 on 10/11.  Patient with continued issues with pain and continues to have left lower extremity weakness with instability.  Patient was received sitting up in his wheelchair awake and alert.  Patient had recently completed PT reported his pain was manageable during the session.  He was oriented with good mental status.  Patient reports that he been very frustrated with extended hospital stay and repeated issues with infection.  He talked about the issues of his infection in his shoulders and side during June and July and frustrations with reoccurrence of difficulties.  Patient denies an exacerbation of his depression and reports that while he is frustrated with everything that is going on he does not feel there has been a severe acute exacerbation of his depression but does feel down and frustrated.  Disposition/Plan:  Today we worked on coping and adjustment issues and addressed vulnerabilities for recurrence of depressive symptomatology with pain and extended hospital stay etc.  Patient does continue to take his Wellbutrin.          Electronically Signed   _______________________ Arley Phenix, Psy.D. Clinical Neuropsychologist

## 2021-06-18 NOTE — Progress Notes (Signed)
Physical Therapy Session Note  Patient Details  Name: John Ware MRN: [id number] Date of Birth: 03/20/1966  Today's Date: 06/18/2021 PT Individual Time: 1450-1530 PT Individual Time Calculation (min): 40 min   Short Term Goals: Week 1:  PT Short Term Goal 1 (Week 1): Pt will initate gait training PT Short Term Goal 2 (Week 1): Pt will self-propel w/c x 100 ft PT Short Term Goal 3 (Week 1): Pt will perform STS with min A  Skilled Therapeutic Interventions/Progress Updates:     Patient in w/c with his wife in the room upon PT arrival. Patient alert and agreeable to PT session. Patient reported 3-4/10 back pain during session, RN made aware. PT provided repositioning, rest breaks, and distraction as pain interventions throughout session.   Patient declined standing or ambulatory activities due to fatigue and reduced pain level, reports standing in earlier session increased his pain significantly. Patient requested to focus on upper extremity strengthening and endurance this session. Reports multiple shoulder surgeries due to infection earlier this year and is eager to progress ROM and strength due to current limitations with ambulation.   Therapeutic Exercise: Patient performed the following exercises with verbal and tactile cues for proper technique. -w/c propulsion for endurance and upper extremity strengthening with a functional task 2x 100 ft -upper extremity ergometer x7.5 min forwards and 7.5 min backwards, only able to tolerate level 1 at this time, provided cues for sitting posture and engagement of back and scapular muscles to support shoulders and trunk throughout  Discussed patient's multiple infections and hospital stays and coping strategies with present mobility deficits. Patient exhibits a positive outlook and motivation to continue to progress. Reports pain is his main limiting factor followed closely by strength deficits.    Patient in w/c with his wife int he room at  end of session with breaks locked, seat belt alarm set, and all needs within reach.   Therapy Documentation Precautions:  Precautions Precautions: Fall, Back Precaution Comments: recalled 3/3 precautions Restrictions Weight Bearing Restrictions: No    Therapy/Group: Individual Therapy  Taila Basinski L Victormanuel Mclure PT, DPT  06/18/2021, 3:36 PM

## 2021-06-18 NOTE — Progress Notes (Signed)
Occupational Therapy Session Note  Patient Details  Name: John Ware MRN: [id number] Date of Birth: 1965-08-29  Today's Date: 06/18/2021 OT Individual Time: 1130-1155 and 1330-1425 OT Individual Time Calculation (min): 25 min and 55 min   Short Term Goals: Week 1:  OT Short Term Goal 1 (Week 1): Pt will perform stand pivot to BSC/toilet with LRAD Min A OT Short Term Goal 2 (Week 1): Pt will perform LB dress with Mod A and AE PRN OT Short Term Goal 3 (Week 1): Pt will perform sit <> stands in prep for ADL with Min A at LRAD OT Short Term Goal 4 (Week 1): Pt will increase AROM for shoulder flexion to 90* in B shoulders   Skilled Therapeutic Interventions/Progress Updates:    Session 1: Pt greeted at time of session sitting up in wheelchair agreeable to OT session, pain at worst 5-6/10 with activity later in session, improved with rest breaks. Self propel to sink level for oral hygiene Mod I, face washing set up. Focused on BUE shoulder AROM/PROM with towel slides at sink surface 2x15 for shoulder flexion, 2x10 shoulder rolls backwards, 2x10 shoulder flexion holding lightweight dowel, and 2x10 scap retraction, all to improve ROM and functional use. Set up in wheelchair IV running and plugged in, alarm on call bell in reach.  Session 2: Pt greeted at time of session sitting up in wheelchair with mild back pain noted, still agreeable to OT session and did not rate pain. States he was premedicated prior to session. Wife [name] present as well who remained for most of session. Pt self propel room <> ADL apartment with BUE propulsion and Min A at one point through narrow fitting. Therapist demonstrating TTB transfer, and when asked pt to perform, stating pain had increased and needed to go back to room for toileting. Note wife and pt agreeable to TTB and understand use at home. Ambulated approx 10" from w/c > bathroom to [facility] over toilet Min A overall. Min A clothing management to doff and able to  clean buttocks in standing. Mod A for LB dressing to don new underwear and pants but pt able to stand and get over hips before walking short distance to w/c. Reviewed with pt and wife regarding how to cover IV site at home with various tapes and bag. Pt up in w/c alarm on call bell in reach.    Therapy Documentation Precautions:  Precautions Precautions: Fall, Back Precaution Comments: recalled 3/3 precautions Restrictions Weight Bearing Restrictions: No     Therapy/Group: Individual Therapy  Erasmo Score 06/18/2021, 7:27 AM

## 2021-06-18 NOTE — Progress Notes (Signed)
Physical Therapy Session Note  Patient Details  Name: John Ware MRN: [id number] Date of Birth: 1966-02-18  Today's Date: 06/18/2021 PT Individual Time: 0817-0859 PT Individual Time Calculation (min): 42 min   Short Term Goals: Week 1:  PT Short Term Goal 1 (Week 1): Pt will initate gait training PT Short Term Goal 2 (Week 1): Pt will self-propel w/c x 100 ft PT Short Term Goal 3 (Week 1): Pt will perform STS with min A  Skilled Therapeutic Interventions/Progress Updates:    Pt in bed on arrival, agreeable to therapy. Pt reports 2/10 pain, therapy to tolerance, no further rating but occ expression of discomfort with activity. Supine>sit from flat bed with bed rail and supervision. Sit to stand from elevated bed with min A, Stand pivot transfer with min A and RW. Pt propelled w/c with BUE to day room mod I, therapist managing IV pole throughout. Pt then performed Stand pivot transfer to nustep in the same manner. Pt became extremely agitated while setting up nustep, voicing frustration of "why won't anyone listen to me" and "then just f**ing send me home." Pt calmed after a few minutes was apologetic and agreeable to finish session. Therapist stated she would not tolerate being sworn at and having aggression directed toward her in this way in the future. Pt then performed nustep at lv 4 x 15 min, pt reported RPE 11/20 at minute 9 and was then instructed to incr pace to >50 spm. Pt reported difficulty keeping L knee in alignment at incr pace and was educated on benefit of challenge. Pt reported RPE 13 during last minute of exercise. Pt requested shoulder exercise as he did not feel safe working in standing. Pt then performed rowing and bicep curls with 2 lb bar, reporting mild increase in pain with this activity. Continues to note stenosis like pain in LLE. Pt returned to room and remained in w/c, was left with all needs in reach and alarm active.   Therapy Documentation Precautions:   Precautions Precautions: Fall, Back Precaution Comments: recalled 3/3 precautions Restrictions Weight Bearing Restrictions: No General:   Vital Signs:   Pain:   Mobility:   Locomotion :    Trunk/Postural Assessment :    Balance:   Exercises:   Other Treatments:      Therapy/Group: Individual Therapy  Juluis Rainier 06/18/2021, 8:14 AM

## 2021-06-18 NOTE — Progress Notes (Signed)
PROGRESS NOTE   Subjective/Complaints:  Pt reports wants to switch back to MS Contin 30 mg and will see how that goes, before going to Percocet- he feels like pain not well controlled-  Thinks it works better, but explained cannot have when he wants it due to tylenol dose.  LBM 2 days ago- denies feeling constipated.  Said slept 9:30 to 4:30 am. Says no meds lasts 12/24 hours.    ROS:  Pt denies SOB, abd pain, CP, N/V/C/D, and vision changes   Objective:   No results found. Recent Labs    06/18/21 0326  WBC 9.8  HGB 10.9*  HCT 34.0*  PLT 502*    Recent Labs    06/18/21 0326  NA 136  K 4.1  CL 97*  CO2 29  GLUCOSE 121*  BUN 7  CREATININE 0.48*  CALCIUM 8.9     Intake/Output Summary (Last 24 hours) at 06/18/2021 1851 Last data filed at 06/18/2021 1809 Gross per 24 hour  Intake 1046 ml  Output 2175 ml  Net -1129 ml     Pressure Injury 06/11/21 Buttocks Left Stage 1 -  Intact skin with non-blanchable redness of a localized area usually over a bony prominence. (Active)  06/11/21 1617  Location: Buttocks  Location Orientation: Left  Staging: Stage 1 -  Intact skin with non-blanchable redness of a localized area usually over a bony prominence.  Wound Description (Comments):   Present on Admission: Yes     Pressure Injury 06/11/21 Buttocks Right Stage 1 -  Intact skin with non-blanchable redness of a localized area usually over a bony prominence. (Active)  06/11/21 1618  Location: Buttocks  Location Orientation: Right  Staging: Stage 1 -  Intact skin with non-blanchable redness of a localized area usually over a bony prominence.  Wound Description (Comments):   Present on Admission: Yes    Physical Exam: Vital Signs Blood pressure 105/74, pulse 83, temperature 98.4 F (36.9 C), temperature source Oral, resp. rate 18, height '5\' 10"'  (1.778 m), weight 72 kg, SpO2 98 %.   General: awake, alert,  appropriate, appears more comfortable, but pt stating pain not controlled; NAD HENT: conjugate gaze; oropharynx moist CV: regular rate; no JVD Pulmonary: CTA B/L; no W/R/R- good air movement GI: soft, NT, ND, (+)BS Psychiatric: appropriate- flat Neurological: Ox3  Skin: Warm and dry.  Intact. Musc: No edema in extremities.  No tenderness in extremities. Neuro: Alert Bilateral upper extremities: 5/5 proximal distal Left lower extremity: Hip flexion, knee extension 0/5, ankle dorsiflexion 4-/5  Assessment/Plan: 1. Functional deficits which require 3+ hours per day of interdisciplinary therapy in a comprehensive inpatient rehab setting. Physiatrist is providing close team supervision and 24 hour management of active medical problems listed below. Physiatrist and rehab team continue to assess barriers to discharge/monitor patient progress toward functional and medical goals  Care Tool:  Bathing    Body parts bathed by patient: Right arm, Left arm, Chest, Abdomen, Right upper leg, Left upper leg, Face, Front perineal area, Buttocks   Body parts bathed by helper: Right lower leg, Left lower leg     Bathing assist Assist Level: Minimal Assistance - Patient > 75%  Upper Body Dressing/Undressing Upper body dressing   What is the patient wearing?: Pull over shirt    Upper body assist Assist Level: Set up assist    Lower Body Dressing/Undressing Lower body dressing      What is the patient wearing?: Pants, Underwear/pull up     Lower body assist Assist for lower body dressing: Moderate Assistance - Patient 50 - 74%     Toileting Toileting    Toileting assist Assist for toileting: Minimal Assistance - Patient > 75%     Transfers Chair/bed transfer  Transfers assist     Chair/bed transfer assist level: Moderate Assistance - Patient 50 - 74%     Locomotion Ambulation   Ambulation assist   Ambulation activity did not occur: Safety/medical concerns           Walk 10 feet activity   Assist  Walk 10 feet activity did not occur: Safety/medical concerns        Walk 50 feet activity   Assist Walk 50 feet with 2 turns activity did not occur: Safety/medical concerns         Walk 150 feet activity   Assist Walk 150 feet activity did not occur: Safety/medical concerns         Walk 10 feet on uneven surface  activity   Assist Walk 10 feet on uneven surfaces activity did not occur: Safety/medical concerns         Wheelchair     Assist Is the patient using a wheelchair?: Yes Type of Wheelchair: Manual Wheelchair activity did not occur: Safety/medical concerns  Wheelchair assist level: Supervision/Verbal cueing Max wheelchair distance: 143f    Wheelchair 50 feet with 2 turns activity    Assist    Wheelchair 50 feet with 2 turns activity did not occur: Safety/medical concerns   Assist Level: Supervision/Verbal cueing   Wheelchair 150 feet activity     Assist  Wheelchair 150 feet activity did not occur: Safety/medical concerns   Assist Level: Minimal Assistance - Patient > 75%   Blood pressure 105/74, pulse 83, temperature 98.4 F (36.9 C), temperature source Oral, resp. rate 18, height '5\' 10"'  (1.778 m), weight 72 kg, SpO2 98 %.  Medical Problem List and Plan: 1.  Decline in mobility and ADLs secondary to LLE radiculopathy resulting in septic lumbar discitis with LE weakness/myelopathy  Con't PT and OT/CIR-  2.  Antithrombotics: -DVT/anticoagulation:  Mechanical: Sequential compression devices, below knee Bilateral lower extremities  Lovenox             -antiplatelet therapy: N/A 3. Pain: still has stabbing/burning bilateral hip/pelvic pain.  Continue robaxin 1000 mg TID.              --MS contin was added 10/17 in addition to prn oxycodone             --Changed home prn gabapentin 800 mg/bedtime for sleep and added gabapentin 100 mg bid during the day for neuropathic pain.               --Cymbalta? For mood and pain. Changed MS contin to Exalgo 16 mg nightly and changed Oxycodone to 15 mg q3 hours, changed to Diclofenac 75 mg BID as well.  10/24- changed Exalgo back to MS contin 30 mg B ID per pt request- will wait on changing to percocet.  4. Mood: LCSW to follow for evaluation and support. Team to provide ego support.              -  antipsychotic agents: N/a 5. Neuropsych: This patient is capable of making decisions on his own behalf. 6. Skin/Wound Care: Routine pressure relief measures.  7. Fluids/Electrolytes/Nutrition: Monitor I/Os 8. 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 --Antibiotics to continue for 6 weeks? -lumbar wound cultures without growth.  10/19- ID - will need IV Abx for 8 weeks from 10/11- surgery date- changed Vanc to IV Dapto and con't Rifampin 10/24- CRP up slightly and ESR up 10 points as well- will recheck next Monday and if still elevating, will call ID.  9. Low grade fevers: Resolved 10. T2DM with peripheral neuropathy/ Hgb A1c-8.4  (was 10.2  01/2021). Will monitor BS ac/hs  --ON insulin glargine  8 units and metformin daily.             --Use SSI for elevated BS/tighter control to help promote wound healing.   10/24- Bgs controlled -con't regimen 11. Episodic Hypotension: Has resolved. Continue to monitor BP TID.  12. Hyponatremia: Monitor as continue to vary from 130-133 13. Acute blood loss anemia: Monitor H/H for trend/recovery.  Hemoglobin 11.5 on 10/18 14. Leucocytosis: Monitor for fevers and other signs of worsening of infection  WBCs 11.7 on 10/18  10/24- WBC 9.8- con't to monitor             --monitor wounds for healing.  15. Low protein stores:Continue prosource BID. Ensure max once daily.              --Continue vitamins/Zinc also.    LOS: 7 days A FACE TO FACE EVALUATION WAS PERFORMED  Mirah Nevins 06/18/2021, 6:51 PM

## 2021-06-19 DIAGNOSIS — M4646 Discitis, unspecified, lumbar region: Secondary | ICD-10-CM | POA: Diagnosis not present

## 2021-06-19 LAB — GLUCOSE, CAPILLARY
Glucose-Capillary: 123 mg/dL — ABNORMAL HIGH (ref 70–99)
Glucose-Capillary: 155 mg/dL — ABNORMAL HIGH (ref 70–99)
Glucose-Capillary: 71 mg/dL (ref 70–99)
Glucose-Capillary: 99 mg/dL (ref 70–99)

## 2021-06-19 MED ORDER — BACLOFEN 5 MG HALF TABLET
5.0000 mg | ORAL_TABLET | Freq: Three times a day (TID) | ORAL | Status: DC
  Administered 2021-06-19 – 2021-06-26 (×21): 5 mg via ORAL
  Filled 2021-06-19 (×21): qty 1

## 2021-06-19 NOTE — Progress Notes (Signed)
Physical Therapy Session Note  Patient Details  Name: John Ware MRN: [id number] Date of Birth: [birth year]  Today's Date: 06/19/2021 PT Individual Time: 0900-0927 PT Individual Time Calculation (min): 27 min   Short Term Goals: Week 1:  PT Short Term Goal 1 (Week 1): Pt will initate gait training PT Short Term Goal 2 (Week 1): Pt will self-propel w/c x 100 ft PT Short Term Goal 3 (Week 1): Pt will perform STS with min A   Skilled Therapeutic Interventions/Progress Updates:    Patient received sitting up in wc, agreeable to PT. He reports 6/10 pain in primarily L hip, premedicated. PT providing rest breaks, distractions and repositioning to assist with pain management. He was able to propel himself in wc to therapy gym with supervision. Attempted Kinetron at 50cm/s B LE, however, patient reported that it increased his pain too much. Seated therex completed with .75# ankle weight LLE and 4 # ankle weight R LE: AROM LAQ, DF/PF, hip flexion. Patient returning to room in wc, seatbelt alarm on, call light within reach.   Therapy Documentation Precautions:  Precautions Precautions: Fall, Back Precaution Comments: recalled 3/3 precautions Restrictions Weight Bearing Restrictions: No     Therapy/Group: Individual Therapy  Elizebeth Koller, PT, DPT, CBIS  06/19/2021, 7:44 AM

## 2021-06-19 NOTE — Progress Notes (Signed)
Patient ID: John Ware, male   DOB: 03/08/1966, 54 y.o.   MRN: [id number] Met with pt and wife who is present to discuss team conference goals of supervision with min for bathing and dressing. Target discharge date 11/1. He is very pleased with this date and feels will be ready this date is also his birthday. Will work on home IV antibiotics and see if can get follow up therapies. Discussed needing a wheelchair but has other pieces of equipment. Continue to work on discharge needs 

## 2021-06-19 NOTE — Progress Notes (Signed)
PICC assessment: RUE double lumen PICC with ongoing issues with occlusion and difficulty obtaining blood return. VAST has noted the PICC is "positional" with eventual blood return after position changes (usually abducting RUE). Pt reports "either red works or purple works, never both."  Antibiotic currently infusing to red lumen without further difficulty.  Issues may warrant evaluation with chest Xray to determine if line has an obvious kink causing these issues. Appears he no longer needs a double lumen PICC. Recommend PICC removal and new PICC placement if the plan is for pt to continue IV antibiotics at home.

## 2021-06-19 NOTE — Progress Notes (Signed)
Physical Therapy Session Note  Patient Details  Name: John Ware MRN: [id number] Date of Birth: May 27, 1966  Today's Date: 06/19/2021 PT Individual Time: 2979-8921 PT Individual Time Calculation (min): 70 min   Short Term Goals: Week 1:  PT Short Term Goal 1 (Week 1): Pt will initate gait training PT Short Term Goal 2 (Week 1): Pt will self-propel w/c x 100 ft PT Short Term Goal 3 (Week 1): Pt will perform STS with min A  Skilled Therapeutic Interventions/Progress Updates:  Pt received seated in WC in room, reported 5/10 pain in back and requested pain meds. Nursing notified. Provided pt with options of things to work on during therapy, pt refused all and stated "he was too tired from earlier sessions". Offered to do UE/LE exercises outside and pt very agreeable. Emphasis of session on improved activity tolerance, mood and UE/LE strength. Pt's wife present for session. Pt verbalized anger regarding not receiving pain meds on consistent schedule, nursing and primary care team notified and pt received pain meds prior to session outside. Pt self-propelled >300' from room to refection fountain w/S* without rest breaks. Noted decreased efficiency w/propulsion due to limited shoulder ROM. The following exercises were performed in [facility] for pain modulation, UE compensation strategies and LE strength:  -Seated bicep curls w/2# weighted bar, 2x10  -Banded pull aparts w/blue theraband, 3x10 w/ 3s isometric hold  -Scapular rows w/blue theraband, 3x10 w/2s isometric hold and 4s eccentric contraction  -Seated marches, 2x15 per side, AAROM on RLE   Pt self-propelled back to room w/max A 2/2 fatigue and was left seated in WC in room, wife present, all needs in reach.   Therapy Documentation Precautions:  Precautions Precautions: Fall, Back Precaution Comments: recalled 3/3 precautions Restrictions Weight Bearing Restrictions: No   Therapy/Group: Individual Therapy Jill Alexanders Aleida Crandell, PT,  DPT  06/19/2021, 7:53 AM

## 2021-06-19 NOTE — Patient Care Conference (Signed)
Inpatient RehabilitationTeam Conference and Plan of Care Update Date: 06/19/2021   Time: 11:34 AM    Patient Name: John Ware      Medical Record Number: [id number]  Date of Birth: 06/12/1966 Sex: Male         Room/Bed: 4W22C/4W22C-01 Payor Info: Payor: [employer] park [employer] / Plan: [employer] PPO / Product Type: *No Product type* /    Admit Date/Time:  06/11/2021  3:38 PM  Primary Diagnosis:  Septic discitis of lumbar region  Hospital Problems: Principal Problem:   Septic discitis of lumbar region Active Problems:   Acute blood loss anemia   Diabetic peripheral neuropathy ([place])   Bacteremia   Postoperative pain    Expected Discharge Date: Expected Discharge Date: 06/26/21  Team Members Present: Physician leading conference: Dr. Courtney Heys Social Worker Present: Ovidio Kin, LCSW Nurse Present: Dorthula Nettles, RN PT Present: Estevan Ryder, PT OT Present: Cherylynn Ridges, OT PPS Coordinator present : Ileana Ladd, PT     Current Status/Progress Goal Weekly Team Focus  Bowel/Bladder   continent b/b, lbm 10/23  remain continent  toilet as needed   Swallow/Nutrition/ Hydration             ADL's   sit > stands Min, CGA short distance ambulation, LLE weakness improving/no buckling, Min LB bathe and dress with AE, pain limiting ( very particular)  Supervision transfers, Min LB  pain management, sit <> stand form and body mechanics, standing balance/tolerance, DC planning, AE training   Mobility   MinA/CGA grossly, reports L hip pain most limiting  spv  pain management, transfers, gait progressions   Communication             Safety/Cognition/ Behavioral Observations            Pain   constant pain  < 3  assess pain q 4hr and prn   Skin   stage 1 x2 to buttom  no new breakdown  assess skin q shift and prn     Discharge Planning:  Home with wife who works from home and can assist.   Team Discussion: Pain is limiting. Complains of muscle spasms. Adjusting  medications. WBC's doing better, blood sugars improving. Recheck labs either Thursday or Monday. Continent B/B. Reports constant pain. Up all night from the pain. Stage 1 x2 to buttocks, foam dressing applied. Discharging home with spouse who works from home. Patient on target to meet rehab goals: yes, walking short distances with RW. STS from varying levels is a barrier. Has his own way of doing things, not always the safest way. Wants to go home by his birthday. Ambulating 18-20 ft with min guard assist. Reports left hip pain today. Supervision goals.   *See Care Plan and progress notes for long and short-term goals.   Revisions to Treatment Plan:  Adjusting medications, rechecking labs.  Teaching Needs: Family education, medication management, pain management, IV antibiotic education, skin/wound care, safety awareness.  Current Barriers to Discharge: Decreased caregiver support, Home enviroment access/layout, IV antibiotics, Wound care, Lack of/limited family support, Weight, Weight bearing restrictions, Medication compliance, and Behavior  Possible Resolutions to Barriers: Family education, monitor labs, continue current medications.     Medical Summary Current Status: CRP and ESR are up slightly; continent B/B; constipated; asking for pain meds q3 hours- even at night; 2 stage 1 on buttocks- B/B- biggest barrier to d/c.  Barriers to Discharge: Behavior;Home enviroment access/layout;Medical stability;Medication compliance;IV antibiotics;Weight;Weight bearing restrictions;Wound care  Barriers to Discharge Comments: going home with wife  24/7- emotionally labile; Possible Resolutions to [employer] Focus: biggest issue sit-stand; walked short (18-20 ft)  distances; pain is somewhat better, however biggest limiter- cursing out staff about pain meds; L hip pain this AM- goals- to titrate pain meds more- added Baclofen  for Muscle spasms- d/c 11/1   Continued Need for Acute Rehabilitation  Level of Care: The patient requires daily medical management by a physician with specialized training in physical medicine and rehabilitation for the following reasons: Direction of a multidisciplinary physical rehabilitation program to maximize functional independence : Yes Medical management of patient stability for increased activity during participation in an intensive rehabilitation regime.: Yes Analysis of laboratory values and/or radiology reports with any subsequent need for medication adjustment and/or medical intervention. : Yes   I attest that I was present, lead the team conference, and concur with the assessment and plan of the team.   Cristi Loron 06/19/2021, 3:52 PM

## 2021-06-19 NOTE — Progress Notes (Signed)
PROGRESS NOTE   Subjective/Complaints:  Pt reports pain is better since went back to MS contin 30 mg BID- thinks it's better for him- wants to go back to percocet, but knows he needs more Oxy right now.   Working with PT on bathing.   ROS:   Pt denies SOB, abd pain, CP, N/V/C/D, and vision changes    Objective:   No results found. Recent Labs    06/18/21 0326  WBC 9.8  HGB 10.9*  HCT 34.0*  PLT 502*    Recent Labs    06/18/21 0326  NA 136  K 4.1  CL 97*  CO2 29  GLUCOSE 121*  BUN 7  CREATININE 0.48*  CALCIUM 8.9     Intake/Output Summary (Last 24 hours) at 06/19/2021 1016 Last data filed at 06/19/2021 0700 Gross per 24 hour  Intake 1308 ml  Output 1850 ml  Net -542 ml     Pressure Injury 06/11/21 Buttocks Left Stage 1 -  Intact skin with non-blanchable redness of a localized area usually over a bony prominence. (Active)  06/11/21 1617  Location: Buttocks  Location Orientation: Left  Staging: Stage 1 -  Intact skin with non-blanchable redness of a localized area usually over a bony prominence.  Wound Description (Comments):   Present on Admission: Yes     Pressure Injury 06/11/21 Buttocks Right Stage 1 -  Intact skin with non-blanchable redness of a localized area usually over a bony prominence. (Active)  06/11/21 1618  Location: Buttocks  Location Orientation: Right  Staging: Stage 1 -  Intact skin with non-blanchable redness of a localized area usually over a bony prominence.  Wound Description (Comments):   Present on Admission: Yes    Physical Exam: Vital Signs Blood pressure 104/74, pulse 69, temperature 98.6 F (37 C), temperature source Oral, resp. rate 16, height '5\' 10"'  (1.778 m), weight 72 kg, SpO2 96 %.    General: awake, alert, appropriate, sitting up in w/c at sink with OT; NAD HENT: conjugate gaze; oropharynx moist CV: regular rate; no JVD Pulmonary: CTA B/L; no W/R/R- good  air movement GI: soft, NT, ND, (+)BS Psychiatric: appropriate; brighter affect Neurological: Ox3  Skin: Warm and dry.  Intact. Back incision scabbed over- looks great Musc: No edema in extremities.  No tenderness in extremities. Neuro: Alert Bilateral upper extremities: 5/5 proximal distal Left lower extremity: Hip flexion, knee extension 0/5, ankle dorsiflexion 4-/5  Assessment/Plan: 1. Functional deficits which require 3+ hours per day of interdisciplinary therapy in a comprehensive inpatient rehab setting. Physiatrist is providing close team supervision and 24 hour management of active medical problems listed below. Physiatrist and rehab team continue to assess barriers to discharge/monitor patient progress toward functional and medical goals  Care Tool:  Bathing    Body parts bathed by patient: Right arm, Left arm, Chest, Abdomen, Right upper leg, Left upper leg, Face, Front perineal area, Buttocks   Body parts bathed by helper: Right lower leg, Left lower leg     Bathing assist Assist Level: Minimal Assistance - Patient > 75%     Upper Body Dressing/Undressing Upper body dressing   What is the patient wearing?: Pull over shirt  Upper body assist Assist Level: Set up assist    Lower Body Dressing/Undressing Lower body dressing      What is the patient wearing?: Pants, Underwear/pull up     Lower body assist Assist for lower body dressing: Moderate Assistance - Patient 50 - 74%     Toileting Toileting    Toileting assist Assist for toileting: Minimal Assistance - Patient > 75%     Transfers Chair/bed transfer  Transfers assist     Chair/bed transfer assist level: Moderate Assistance - Patient 50 - 74%     Locomotion Ambulation   Ambulation assist   Ambulation activity did not occur: Safety/medical concerns          Walk 10 feet activity   Assist  Walk 10 feet activity did not occur: Safety/medical concerns        Walk 50 feet  activity   Assist Walk 50 feet with 2 turns activity did not occur: Safety/medical concerns         Walk 150 feet activity   Assist Walk 150 feet activity did not occur: Safety/medical concerns         Walk 10 feet on uneven surface  activity   Assist Walk 10 feet on uneven surfaces activity did not occur: Safety/medical concerns         Wheelchair     Assist Is the patient using a wheelchair?: Yes Type of Wheelchair: Manual Wheelchair activity did not occur: Safety/medical concerns  Wheelchair assist level: Supervision/Verbal cueing Max wheelchair distance: 128f    Wheelchair 50 feet with 2 turns activity    Assist    Wheelchair 50 feet with 2 turns activity did not occur: Safety/medical concerns   Assist Level: Supervision/Verbal cueing   Wheelchair 150 feet activity     Assist  Wheelchair 150 feet activity did not occur: Safety/medical concerns   Assist Level: Minimal Assistance - Patient > 75%   Blood pressure 104/74, pulse 69, temperature 98.6 F (37 C), temperature source Oral, resp. rate 16, height '5\' 10"'  (1.778 m), weight 72 kg, SpO2 96 %.  Medical Problem List and Plan: 1.  Decline in mobility and ADLs secondary to LLE radiculopathy resulting in septic lumbar discitis with LE weakness/myelopathy  Con't PT and OT/CIR_ team conference today to determine d/c date 2.  Antithrombotics: -DVT/anticoagulation:  Mechanical: Sequential compression devices, below knee Bilateral lower extremities  Lovenox on board             -antiplatelet therapy: N/A 3. Pain: still has stabbing/burning bilateral hip/pelvic pain.  Continue robaxin 1000 mg TID.              --MS contin was added 10/17 in addition to prn oxycodone 10/24- changed Exalgo back to MS contin 30 mg B ID per pt request- will wait on changing to percocet.   10/25- pain is doing better per pt with MS Contin- will con't current regimen and monitor- also c/o more muscle spasms- will add  Baclofen 5 mg TID; allergic to Zanaflex-  4. Mood: LCSW to follow for evaluation and support. Team to provide ego support.              -antipsychotic agents: N/a 5. Neuropsych: This patient is capable of making decisions on his own behalf. 6. Skin/Wound Care: Routine pressure relief measures.  7. Fluids/Electrolytes/Nutrition: Monitor I/Os 8. 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 --Antibiotics to continue for 6 weeks? -lumbar wound cultures without growth.  10/19- ID -  will need IV Abx for 8 weeks from 10/11- surgery date- changed Vanc to IV Dapto and con't Rifampin 10/24- CRP up slightly and ESR up 10 points as well- will recheck next Monday and if still elevating, will call ID.  9. Low grade fevers: Resolved 10. T2DM with peripheral neuropathy/ Hgb A1c-8.4  (was 10.2  01/2021). Will monitor BS ac/hs  --ON insulin glargine  8 units and metformin daily.             --Use SSI for elevated BS/tighter control to help promote wound healing.   10/25- BG's look good- con't regimen 11. Episodic Hypotension: Has resolved. Continue to monitor BP TID.  12. Hyponatremia: Monitor as continue to vary from 130-133 13. Acute blood loss anemia: Monitor H/H for trend/recovery.  Hemoglobin 11.5 on 10/18 14. Leucocytosis: Monitor for fevers and other signs of worsening of infection  WBCs 11.7 on 10/18  10/24- WBC 9.8- con't to monitor             --monitor wounds for healing.  15. Low protein stores:Continue prosource BID. Ensure max once daily.              --Continue vitamins/Zinc also.    LOS: 8 days A FACE TO FACE EVALUATION WAS PERFORMED  Kimisha Eunice 06/19/2021, 10:16 AM

## 2021-06-19 NOTE — Progress Notes (Signed)
Occupational Therapy Session Note  Patient Details  Name: John Ware MRN: [id number] Date of Birth: 02/01/1966  Today's Date: 06/19/2021 OT Individual Time: [phone] and [phone]  OT Individual Time Calculation (min): 58 min and 25 min Missed 20 mins in PM session 2/2 pain   Short Term Goals: Week 1:  OT Short Term Goal 1 (Week 1): Pt will perform stand pivot to BSC/toilet with LRAD Min A OT Short Term Goal 2 (Week 1): Pt will perform LB dress with Mod A and AE PRN OT Short Term Goal 3 (Week 1): Pt will perform sit <> stands in prep for ADL with Min A at LRAD OT Short Term Goal 4 (Week 1): Pt will increase AROM for shoulder flexion to 90* in B shoulders   Skilled Therapeutic Interventions/Progress Updates:    Session 1: Pt greeted at time of session supine in bed resting, stating he had not received morning meds, requesting this prior to therapy session. RN aware and presented quickly for med pass including pain meds. Supine > sit with HOB elevated to simulate home bed with Supervision and in standing, stating needed to use bathroom. Ambulated to bathroom CGA with RW and transferred to commode with BSC on top same manner. Doffed clothes in standing CGA/Min for standing balance, sit > stands from commode surface with Mod A when he pulls from bar but encouraged pt not to do this as he will not have at home but continued to do so. Ambulated short distance back to wheelchair and oral hygiene sink level seated. UB/LB bathing seated and standing at sink with Min A overall 2/2 back precautions, LB dressing Min A as well to fully thread with use of reacher for AE. Up in wheelchair alarm on call bell in reach.    Session 2: Pt greeted at time of session sitting up in wheelchair with wife [name] present who did not remain for session, c/o 5/10 pain but later increased in session with pt requesting to miss time to return to bed. Initial part of session spent discussing DC date, DC planning, and  answering pt questions. Pt education on form for sit <> stands 2/2 poor form this am session, focused on feet placement, hand placement, and preventing abnormal posture and preventing back pain. Pt receptive and appreciate but stating he could not do more OT and needing to return to bed. Stand pivot w/c > bed with Min A for sit to stand and CGA for actual transfer. Sit > supine Min A for LE management. Alarm on call bell in reach and missed 20 mins of OT. Note pt improved with positional change and pt already premedicated.  Therapy Documentation Precautions:  Precautions Precautions: Fall, Back Precaution Comments: recalled 3/3 precautions Restrictions Weight Bearing Restrictions: No     Therapy/Group: Individual Therapy  Erasmo Score 06/19/2021, 7:09 AM

## 2021-06-20 ENCOUNTER — Inpatient Hospital Stay: Payer: Self-pay

## 2021-06-20 DIAGNOSIS — M4646 Discitis, unspecified, lumbar region: Secondary | ICD-10-CM | POA: Diagnosis not present

## 2021-06-20 LAB — GLUCOSE, CAPILLARY
Glucose-Capillary: 97 mg/dL (ref 70–99)
Glucose-Capillary: 106 mg/dL — ABNORMAL HIGH (ref 70–99)
Glucose-Capillary: 100 mg/dL — ABNORMAL HIGH (ref 70–99)
Glucose-Capillary: 170 mg/dL — ABNORMAL HIGH (ref 70–99)

## 2021-06-20 MED ORDER — MORPHINE SULFATE ER 15 MG PO TBCR
45.0000 mg | EXTENDED_RELEASE_TABLET | Freq: Two times a day (BID) | ORAL | Status: DC
Start: 2021-06-20 — End: 2021-06-26
  Administered 2021-06-20 – 2021-06-26 (×12): 45 mg via ORAL
  Filled 2021-06-20 (×12): qty 3

## 2021-06-20 NOTE — Progress Notes (Signed)
Physical Therapy Session Note  Patient Details  Name: John Ware MRN: [id number] Date of Birth: 1965/11/01  Today's Date: 06/20/2021 PT Individual Time: 4098-1191 PT Individual Time Calculation (min): 25 min   Short Term Goals: Week 1:  PT Short Term Goal 1 (Week 1): Pt will initate gait training PT Short Term Goal 1 - Progress (Week 1): Met PT Short Term Goal 2 (Week 1): Pt will self-propel w/c x 100 ft PT Short Term Goal 2 - Progress (Week 1): Met PT Short Term Goal 3 (Week 1): Pt will perform STS with min A PT Short Term Goal 3 - Progress (Week 1): Partly met  Skilled Therapeutic Interventions/Progress Updates:  Pt received supine in bed, reports feeling "sick on my stomach" due to possible infection. Pt agreeable with encouragement to participate in therapy session as able. Pt reports 3/10 pain lower back at rest, no increase in pain with mobility during session. Supine to sit with Supervision with increased time, use of bedrail and HOB elevated. Seated BLE strengthening therex: marches (R>L), LAQ RLE, heel slides LLE, heel/toe raises, hip add squeeze x 5 sec hold; 2 x 10 reps each. Pt declines any standing this session due to not feeling well. Pt left seated EOB to eat lunch his wife brought for him from [place], needs in reach, bed alarm in place.  Therapy Documentation Precautions:  Precautions Precautions: Fall, Back Precaution Comments: recalled 3/3 precautions Restrictions Weight Bearing Restrictions: No     Therapy/Group: Individual Therapy   Excell Seltzer, PT, DPT, CSRS  06/20/2021, 5:20 PM

## 2021-06-20 NOTE — Progress Notes (Signed)
Occupational Therapy Note  Patient Details  Name: John Ware MRN: [id number] Date of Birth: [birth year]  Today's Date: 06/20/2021 OT Missed Time: 60 Minutes Missed Time Reason: Patient ill (comment);Other (comment) (nausea)  Pt missed 60 mins of group session d/t nausea. Will check back as time allows to make up for missed minutes.    Pollyann Glen [facility] 06/20/2021, 4:13 PM

## 2021-06-20 NOTE — Progress Notes (Signed)
Occupational Therapy Weekly Progress Note  Patient Details  Name: John Ware MRN: [id number] Date of Birth: 1966/03/30  Beginning of progress report period: June 12, 2021 End of progress report period: June 20, 2021  Today's Date: 06/20/2021 OT Individual Time: 0211-1552 OT Individual Time Calculation (min): 55 min    Patient has met 3 of 4 short term goals.  Pt has improved significantly toward OT goals but continues to be significantly limited by pain. MD/nursing aware and are medically managing. Pt is performing LB dressing with AE Min/Mod A, stand pivot transfers with CGA with RW, and Min-Mod A for sit <> stands. Pt's spouse [name] has been present for several sessions for family training and education, plan to continue education as able.   Patient continues to demonstrate the following deficits: muscle weakness, decreased cardiorespiratoy endurance, impaired timing and sequencing, decreased coordination, and decreased motor planning, and decreased sitting balance, decreased standing balance, decreased postural control, and decreased balance strategies and therefore will continue to benefit from skilled OT intervention to enhance overall performance with BADL, iADL, and Reduce care partner burden.  Patient progressing toward long term goals..  Continue plan of care.  OT Short Term Goals Week 1:  OT Short Term Goal 1 (Week 1): Pt will perform stand pivot to BSC/toilet with LRAD Min A OT Short Term Goal 1 - Progress (Week 1): Met OT Short Term Goal 2 (Week 1): Pt will perform LB dress with Mod A and AE PRN OT Short Term Goal 2 - Progress (Week 1): Met OT Short Term Goal 3 (Week 1): Pt will perform sit <> stands in prep for ADL with Min A at [place] Term Goal 3 - Progress (Week 1): Met OT Short Term Goal 4 (Week 1): Pt will increase AROM for shoulder flexion to 90* in B shoulders OT Short Term Goal 4 - Progress (Week 1): Progressing toward goal Week 2:  OT Short Term  Goal 1 (Week 2): STGs = LTGs 2/2 LOS  Skilled Therapeutic Interventions/Progress Updates:    Pt greeted at time of session supine in bed sleeping but easily woken and agreeable to OT session after receiving pain meds. Nursing present quickly and provided morning med pass including pain meds. Supine > sit Superviison with HOB elevated and using bed rails. Ambulated to sink level with CGA and RW and performed UB/LB bathing seated and sit > stand for buttocks with Min A overall. UB dress set up and LB dress Mod A today 2/2 lack of AE to thread over feet, attempted figure four and able to bring up RLE to thread but eventually needed assist. Pt with consistent back pain throughout session, needing rest breaks and STM to RLQ which improved pain somewhat per pt. MD also present during session for morning rounds. Pt up in chair at end of session alarm on call bell in reach.    Therapy Documentation Precautions:  Precautions Precautions: Fall, Back Precaution Comments: recalled 3/3 precautions Restrictions Weight Bearing Restrictions: No     Therapy/Group: Individual Therapy  Viona Gilmore 06/20/2021, 7:11 AM

## 2021-06-20 NOTE — Progress Notes (Signed)
Physical Therapy Weekly Progress Note  Patient Details  Name: John Ware MRN: [id number] Date of Birth: 07/20/1966  Beginning of progress report period: June 13, 2021 End of progress report period: June 20, 2021  Today's Date: 06/20/2021 PT Individual Time: 0900-1000 PT Individual Time Calculation (min): 60 min   Patient has met 2 of 3 short term goals.  Sit to stand with mod-max A, bed mobility with min-supervision, Gait up to 20 ft with RW and min A  Patient continues to demonstrate the following deficits muscle weakness, decreased cardiorespiratoy endurance, abnormal tone and decreased coordination, and decreased standing balance, decreased balance strategies, and difficulty maintaining precautions and therefore will continue to benefit from skilled PT intervention to increase functional independence with mobility.  Patient progressing toward long term goals..  Continue plan of care.  PT Short Term Goals Week 1:  PT Short Term Goal 1 (Week 1): Pt will initate gait training PT Short Term Goal 1 - Progress (Week 1): Met PT Short Term Goal 2 (Week 1): Pt will self-propel w/c x 100 ft PT Short Term Goal 2 - Progress (Week 1): Met PT Short Term Goal 3 (Week 1): Pt will perform STS with min A PT Short Term Goal 3 - Progress (Week 1): Partly met Week 2:  PT Short Term Goal 1 (Week 2): =LTGs d/t ELOS  Skilled Therapeutic Interventions/Progress Updates:  Pt seated in w/c on arrival and agreeable to therapy. Pt reports unrated pain in his hip, stenotic pain in his LLE, premedicated. Provided rest breaks, distraction, and directed in deep breathing for pain management. Pt reported that he feels his RLE is weaker than it has been in the past. MMT=4-/5 overall, slightly weaker than eval. Pt able to stand with same level of assist as previous sessions. Pt agreeable to attempting gait to further assess weakness.  Gait x8 ft, x 16 ft with extended rest breaks. Pt demoed step to gait  pattern with RLE compared to step through pattern previously, hunched posture, and heavy reliance on UE support. Discussed possible spasticity in his legs making mobility more difficult and possibly increasing pain. MD made aware. Pt participated in kinetron x 3 min for LE strength and endurance. Therapist and pt also reviewed radiology report from latest MRI for improved understanding of condition. Pt returned to room and performed Stand pivot transfer with RW and mod A, sit>supine with min A. Pt left in bed and was left with all needs in reach and alarm active.   Ambulation/gait training;Cognitive remediation/compensation;Discharge planning;DME/adaptive equipment instruction;Functional mobility training;Pain management;Psychosocial support;Splinting/orthotics;Therapeutic Activities;UE/LE Strength taining/ROM;Visual/perceptual remediation/compensation;Balance/vestibular training;Disease management/prevention;Community reintegration;Functional electrical stimulation;Neuromuscular re-education;Patient/family education;Skin care/wound management;Stair training;Therapeutic Exercise;UE/LE Coordination activities;Wheelchair propulsion/positioning   Therapy Documentation Precautions:  Precautions Precautions: Fall, Back Precaution Comments: recalled 3/3 precautions Restrictions Weight Bearing Restrictions: No   Therapy/Group: Individual Therapy   C  06/20/2021, 1:26 PM  

## 2021-06-20 NOTE — Progress Notes (Signed)
PROGRESS NOTE   Subjective/Complaints:  Pt asking to increase meds again- 30 mg BID is "just not enough"- plans to go back to percocet when discharged.  Addressed pt's behavior with staff with frustrated.   Main pain in R hip PICC is occluded- asked nursing to arrange for it to be exchanged.   Also, spoke with PT_ pt concerned about weakness worse in RLE- is worried- will do ASIA exam in AM since asked after pt seen. .   ROS:   Pt denies SOB, abd pain, CP, N/V/C/D, and vision changes    Objective:   No results found. Recent Labs    06/18/21 0326  WBC 9.8  HGB 10.9*  HCT 34.0*  PLT 502*    Recent Labs    06/18/21 0326  NA 136  K 4.1  CL 97*  CO2 29  GLUCOSE 121*  BUN 7  CREATININE 0.48*  CALCIUM 8.9     Intake/Output Summary (Last 24 hours) at 06/20/2021 1314 Last data filed at 06/20/2021 0330 Gross per 24 hour  Intake 720 ml  Output 1625 ml  Net -905 ml     Pressure Injury 06/11/21 Buttocks Left Stage 1 -  Intact skin with non-blanchable redness of a localized area usually over a bony prominence. (Active)  06/11/21 1617  Location: Buttocks  Location Orientation: Left  Staging: Stage 1 -  Intact skin with non-blanchable redness of a localized area usually over a bony prominence.  Wound Description (Comments):   Present on Admission: Yes     Pressure Injury 06/11/21 Buttocks Right Stage 1 -  Intact skin with non-blanchable redness of a localized area usually over a bony prominence. (Active)  06/11/21 1618  Location: Buttocks  Location Orientation: Right  Staging: Stage 1 -  Intact skin with non-blanchable redness of a localized area usually over a bony prominence.  Wound Description (Comments):   Present on Admission: Yes    Physical Exam: Vital Signs Blood pressure 117/87, pulse 98, temperature 99.5 F (37.5 C), temperature source Oral, resp. rate 18, height '5\' 10"'  (1.778 m), weight 76.3  kg, SpO2 96 %.     General: awake, alert, appropriate, OT at bedside; pt in w/c at sink- bathing; NAD HENT: conjugate gaze; oropharynx moist CV: regular rate; no JVD Pulmonary: CTA B/L; no W/R/R- good air movement GI: soft, NT, ND, (+)BS Psychiatric: appropriate, but irritable about treating others with less irritability Neurological: alert  Skin: Warm and dry.  Intact. Back incision scabbed over- looks great- PICC line looks ok, but is occluded per nursing team Musc: No edema in extremities.  No tenderness in extremities. Neuro: Alert Bilateral upper extremities: 5/5 proximal distal Left lower extremity: Hip flexion, knee extension 0/5, ankle dorsiflexion 4-/5  Assessment/Plan: 1. Functional deficits which require 3+ hours per day of interdisciplinary therapy in a comprehensive inpatient rehab setting. Physiatrist is providing close team supervision and 24 hour management of active medical problems listed below. Physiatrist and rehab team continue to assess barriers to discharge/monitor patient progress toward functional and medical goals  Care Tool:  Bathing    Body parts bathed by patient: Right arm, Left arm, Chest, Abdomen, Right upper leg, Left upper  leg, Face, Front perineal area, Buttocks   Body parts bathed by helper: Right lower leg, Left lower leg     Bathing assist Assist Level: Minimal Assistance - Patient > 75%     Upper Body Dressing/Undressing Upper body dressing   What is the patient wearing?: Pull over shirt    Upper body assist Assist Level: Set up assist    Lower Body Dressing/Undressing Lower body dressing      What is the patient wearing?: Pants, Underwear/pull up     Lower body assist Assist for lower body dressing: Minimal Assistance - Patient > 75% (with reacher)     Toileting Toileting    Toileting assist Assist for toileting: Minimal Assistance - Patient > 75%     Transfers Chair/bed transfer  Transfers assist     Chair/bed  transfer assist level: Moderate Assistance - Patient 50 - 74%     Locomotion Ambulation   Ambulation assist   Ambulation activity did not occur: Safety/medical concerns          Walk 10 feet activity   Assist  Walk 10 feet activity did not occur: Safety/medical concerns        Walk 50 feet activity   Assist Walk 50 feet with 2 turns activity did not occur: Safety/medical concerns         Walk 150 feet activity   Assist Walk 150 feet activity did not occur: Safety/medical concerns         Walk 10 feet on uneven surface  activity   Assist Walk 10 feet on uneven surfaces activity did not occur: Safety/medical concerns         Wheelchair     Assist Is the patient using a wheelchair?: Yes Type of Wheelchair: Manual Wheelchair activity did not occur: Safety/medical concerns  Wheelchair assist level: Supervision/Verbal cueing Max wheelchair distance: 138f    Wheelchair 50 feet with 2 turns activity    Assist    Wheelchair 50 feet with 2 turns activity did not occur: Safety/medical concerns   Assist Level: Supervision/Verbal cueing   Wheelchair 150 feet activity     Assist  Wheelchair 150 feet activity did not occur: Safety/medical concerns   Assist Level: Supervision/Verbal cueing   Blood pressure 117/87, pulse 98, temperature 99.5 F (37.5 C), temperature source Oral, resp. rate 18, height '5\' 10"'  (1.778 m), weight 76.3 kg, SpO2 96 %.  Medical Problem List and Plan: 1.  Decline in mobility and ADLs secondary to LLE radiculopathy resulting in septic lumbar discitis with LE weakness/myelopathy  Con't PT and OT- d/c date 11[birth year]- [birth year]-  2.  Antithrombotics: -DVT/anticoagulation:  Mechanical: Sequential compression devices, below knee Bilateral lower extremities  Lovenox on board             -antiplatelet therapy: N/A 3. Pain: still has stabbing/burning bilateral hip/pelvic pain.  Continue robaxin 1000 mg TID.               --MS contin was added 10/17 in addition to prn oxycodone 10/24- changed Exalgo back to MS contin 30 mg B ID per pt request- will wait on changing to percocet.   10/25- pain is doing better per pt with MS Contin- will con't current regimen and monitor- also c/o more muscle spasms- will add Baclofen 5 mg TID; allergic to Zanaflex- 10/26- will increase ms contin to 45 mg BID- maintain oxy- but advised pt would like him to take less- cannot get him these doses out of hospital.  4. Mood: LCSW to follow for evaluation and support. Team to provide ego support.              -antipsychotic agents: N/a 5. Neuropsych: This patient is capable of making decisions on his own behalf. 6. Skin/Wound Care: Routine pressure relief measures.  7. Fluids/Electrolytes/Nutrition: Monitor I/Os 8. 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 --Antibiotics to continue for 6 weeks? -lumbar wound cultures without growth.  10/19- ID - will need IV Abx for 8 weeks from 10/11- surgery date- changed Vanc to IV Dapto and con't Rifampin 10/24- CRP up slightly and ESR up 10 points as well- will recheck next Monday and if still elevating, will call ID. 10/26- will check in AM- since leaving next Tuesday  9. Low grade fevers: Resolved 10. T2DM with peripheral neuropathy/ Hgb A1c-8.4  (was 10.2  01/2021). Will monitor BS ac/hs  --ON insulin glargine  8 units and metformin daily.             --Use SSI for elevated BS/tighter control to help promote wound healing.   10/25- BG's look good- con't regimen 11. Episodic Hypotension: Has resolved. Continue to monitor BP TID.  12. Hyponatremia: Monitor as continue to vary from 130-133 13. Acute blood loss anemia: Monitor H/H for trend/recovery.  Hemoglobin 11.5 on 10/18 14. Leucocytosis: Monitor for fevers and other signs of worsening of infection  WBCs 11.7 on 10/18  10/24- WBC 9.8- con't to monitor             --monitor wounds for healing.   15. Low protein stores:Continue prosource BID. Ensure max once daily.              --Continue vitamins/Zinc also.  16. Dispo   10/26- addressed pt's behavior with other staff members- I need him to be more polite   LOS: 9 days A FACE TO FACE EVALUATION WAS PERFORMED  Osama Coleson 06/20/2021, 1:14 PM

## 2021-06-21 ENCOUNTER — Inpatient Hospital Stay (HOSPITAL_COMMUNITY): Payer: Commercial Managed Care - PPO

## 2021-06-21 DIAGNOSIS — A4902 Methicillin resistant Staphylococcus aureus infection, unspecified site: Secondary | ICD-10-CM

## 2021-06-21 DIAGNOSIS — M4646 Discitis, unspecified, lumbar region: Secondary | ICD-10-CM | POA: Diagnosis not present

## 2021-06-21 DIAGNOSIS — R7881 Bacteremia: Secondary | ICD-10-CM | POA: Diagnosis not present

## 2021-06-21 DIAGNOSIS — G8918 Other acute postprocedural pain: Secondary | ICD-10-CM | POA: Diagnosis not present

## 2021-06-21 DIAGNOSIS — R29898 Other symptoms and signs involving the musculoskeletal system: Secondary | ICD-10-CM

## 2021-06-21 DIAGNOSIS — E1142 Type 2 diabetes mellitus with diabetic polyneuropathy: Secondary | ICD-10-CM | POA: Diagnosis not present

## 2021-06-21 LAB — CBC WITH DIFFERENTIAL/PLATELET
Abs Immature Granulocytes: 0.05 10*3/uL (ref 0.00–0.07)
Basophils Absolute: 0 10*3/uL (ref 0.0–0.1)
Basophils Relative: 0 %
Eosinophils Absolute: 0.2 10*3/uL (ref 0.0–0.5)
Eosinophils Relative: 2 %
HCT: 34 % — ABNORMAL LOW (ref 39.0–52.0)
Hemoglobin: 10.9 g/dL — ABNORMAL LOW (ref 13.0–17.0)
Immature Granulocytes: 1 %
Lymphocytes Relative: 22 %
Lymphs Abs: 2.4 10*3/uL (ref 0.7–4.0)
MCH: 26.2 pg (ref 26.0–34.0)
MCHC: 32.1 g/dL (ref 30.0–36.0)
MCV: 81.7 fL (ref 80.0–100.0)
Monocytes Absolute: 0.9 10*3/uL (ref 0.1–1.0)
Monocytes Relative: 8 %
Neutro Abs: 7.3 10*3/uL (ref 1.7–7.7)
Neutrophils Relative %: 67 %
Platelets: 525 10*3/uL — ABNORMAL HIGH (ref 150–400)
RBC: 4.16 MIL/uL — ABNORMAL LOW (ref 4.22–5.81)
RDW: 15.9 % — ABNORMAL HIGH (ref 11.5–15.5)
WBC: 10.8 10*3/uL — ABNORMAL HIGH (ref 4.0–10.5)
nRBC: 0 % (ref 0.0–0.2)

## 2021-06-21 LAB — CULTURE, BLOOD (ROUTINE X 2)

## 2021-06-21 LAB — GLUCOSE, CAPILLARY
Glucose-Capillary: 141 mg/dL — ABNORMAL HIGH (ref 70–99)
Glucose-Capillary: 129 mg/dL — ABNORMAL HIGH (ref 70–99)
Glucose-Capillary: 103 mg/dL — ABNORMAL HIGH (ref 70–99)
Glucose-Capillary: 123 mg/dL — ABNORMAL HIGH (ref 70–99)

## 2021-06-21 LAB — SEDIMENTATION RATE: Sed Rate: 66 mm/hr — ABNORMAL HIGH (ref 0–16)

## 2021-06-21 LAB — CK: Total CK: 26 U/L — ABNORMAL LOW (ref 49–397)

## 2021-06-21 LAB — C-REACTIVE PROTEIN: CRP: 12.4 mg/dL — ABNORMAL HIGH (ref <1.0)

## 2021-06-21 MED ORDER — DIAZEPAM 5 MG PO TABS: 5.0000 mg | ORAL_TABLET | ORAL | Status: DC

## 2021-06-21 MED ORDER — GADOBUTROL 1 MMOL/ML IV SOLN
8.0000 mL | Freq: Once | INTRAVENOUS | Status: AC | PRN
  Administered 2021-06-21: 8 mL via INTRAVENOUS

## 2021-06-21 MED ORDER — LORAZEPAM 2 MG/ML IJ SOLN
2.0000 mg | Freq: Once | INTRAMUSCULAR | Status: AC
  Administered 2021-06-21: 2 mg via INTRAVENOUS
  Filled 2021-06-21: qty 1

## 2021-06-21 NOTE — Progress Notes (Signed)
Occupational Therapy Session Note  Patient Details  Name: John Ware MRN: [id number] Date of Birth: February 04, 1966  Today's Date: 06/22/2021 OT Individual Time: 2091-9802 OT Individual Time Calculation (min): 58 min   Short Term Goals: Week 1:  OT Short Term Goal 1 (Week 1): Pt will perform stand pivot to BSC/toilet with LRAD Min A OT Short Term Goal 1 - Progress (Week 1): Met OT Short Term Goal 2 (Week 1): Pt will perform LB dress with Mod A and AE PRN OT Short Term Goal 2 - Progress (Week 1): Met OT Short Term Goal 3 (Week 1): Pt will perform sit <> stands in prep for ADL with Min A at [place] Term Goal 3 - Progress (Week 1): Met OT Short Term Goal 4 (Week 1): Pt will increase AROM for shoulder flexion to 90* in B shoulders OT Short Term Goal 4 - Progress (Week 1): Progressing toward goal  Skilled Therapeutic Interventions/Progress Updates:    MD called regarding bedrest status and new verbal orders given: MD okaying therapy per pts tolerance but no standing.   Pt agreeable to session, wanting to engage in bathing/dressing tasks. Setup for supine<sit and Min A for squat pivot<w/c. Pt then engaged in stated tasks w/c level at the sink, engaging in small w/c push ups as needed to meet demands of LB dressing. Pt set up with sock aide and reacher to use for back precaution adherence. RN in during session to provide pain medicine per pt request as well. Oral care/grooming tasks performed at seated level with setup-Mod I. He remained sitting up at close of session, all needs within reach. Tx focus placed on adaptive self care skills, sitting balance, OOB tolerance, and precaution adherence during functional activity.   Therapy Documentation Precautions:  Precautions Precautions: Fall, Back Precaution Comments: recalled 3/3 precautions Restrictions Weight Bearing Restrictions: No  ADL: ADL Eating: Set up Grooming: Setup Upper Body Bathing: Minimal assistance Lower Body  Bathing: Dependent Upper Body Dressing: Minimal assistance Lower Body Dressing: Dependent (simulated) Toileting: Maximal assistance (simulated) Toilet Transfer: Moderate assistance Toilet Transfer Method: Stand pivot  Therapy/Group: Individual Therapy  Eldean Klatt A Shahir Karen 06/22/2021, 12:23 PM

## 2021-06-21 NOTE — Progress Notes (Signed)
PROGRESS NOTE   Subjective/Complaints:  Pt reports legs MUCH weaker, esp LLE- had near fall this AM- down to knees- when knees buckled.   Pt worried, but "still wants to go home 11/1 since it's his bday"- explained this is very serious, because his infectious markers are up and had weakness and had fever yesterday, per pt- 99.5 max.   Called ID and NSU from room- and explained the issue- I ordered MRI with and without contrast- Dr Ellene Route said would look for it when back over here.     ROS:   Pt denies SOB, abd pain, CP, N/V/C/D, and vision changes Pt having LE weakness L>R   Objective:   [place] EKG Site Rite  Result Date: 06/20/2021 If Site Rite image not attached, placement could not be confirmed due to current cardiac rhythm.  Recent Labs    06/21/21 0224  WBC 10.8*  HGB 10.9*  HCT 34.0*  PLT 525*    No results for input(s): NA, K, CL, CO2, GLUCOSE, BUN, CREATININE, CALCIUM in the last 72 hours.    Intake/Output Summary (Last 24 hours) at 06/21/2021 1015 Last data filed at 06/21/2021 0900 Gross per 24 hour  Intake 360 ml  Output 1825 ml  Net -1465 ml     Pressure Injury 06/11/21 Buttocks Left Stage 1 -  Intact skin with non-blanchable redness of a localized area usually over a bony prominence. (Active)  06/11/21 1617  Location: Buttocks  Location Orientation: Left  Staging: Stage 1 -  Intact skin with non-blanchable redness of a localized area usually over a bony prominence.  Wound Description (Comments):   Present on Admission: Yes     Pressure Injury 06/11/21 Buttocks Right Stage 1 -  Intact skin with non-blanchable redness of a localized area usually over a bony prominence. (Active)  06/11/21 1618  Location: Buttocks  Location Orientation: Right  Staging: Stage 1 -  Intact skin with non-blanchable redness of a localized area usually over a bony prominence.  Wound Description (Comments):   Present on  Admission: Yes    Physical Exam: Vital Signs Blood pressure 108/73, pulse 65, temperature 98.9 F (37.2 C), temperature source Oral, resp. rate 17, height _0  (1.778 m), weight 76.3 kg, SpO2 96 %.       General: awake, alert, appropriate, c/o pain; up in w/c after therapy;  NAD HENT: conjugate gaze; oropharynx moist CV: regular rate; no JVD Pulmonary: CTA B/L; no W/R/R- good air movement GI: soft, NT, ND, (+)BS Psychiatric: appropriate but more depressed affect Neurological: Ox3 RLE_ HF 2/5; KE 4-/5, DF 4-/5 and PF 4/5 LLE- HF 1/5 at best; KE 1/5 at best; DF 3-/5 and PF 3/5  Skin: Warm and dry.  Intact. Back incision scabbed over- looks great- PICC line looks ok, but is occluded per nursing team Musc: No edema in extremities.  No tenderness in extremities. Neuro: Alert Bilateral upper extremities: 5/5 proximal distal Left lower extremity: Hip flexion, knee extension 0/5, ankle dorsiflexion 4-/5  Assessment/Plan: 1. Functional deficits which require 3+ hours per day of interdisciplinary therapy in a comprehensive inpatient rehab setting. Physiatrist is providing close team supervision and 24 hour management of active medical  problems listed below. Physiatrist and rehab team continue to assess barriers to discharge/monitor patient progress toward functional and medical goals  Care Tool:  Bathing    Body parts bathed by patient: Right arm, Left arm, Chest, Abdomen, Right upper leg, Left upper leg, Face, Front perineal area, Buttocks   Body parts bathed by helper: Right lower leg, Left lower leg     Bathing assist Assist Level: Minimal Assistance - Patient > 75%     Upper Body Dressing/Undressing Upper body dressing   What is the patient wearing?: Pull over shirt    Upper body assist Assist Level: Set up assist    Lower Body Dressing/Undressing Lower body dressing      What is the patient wearing?: Pants, Underwear/pull up     Lower body assist Assist for  lower body dressing: Minimal Assistance - Patient > 75% (with reacher)     Toileting Toileting    Toileting assist Assist for toileting: Minimal Assistance - Patient > 75%     Transfers Chair/bed transfer  Transfers assist     Chair/bed transfer assist level: Moderate Assistance - Patient 50 - 74%     Locomotion Ambulation   Ambulation assist   Ambulation activity did not occur: Safety/medical concerns  Assist level: Minimal Assistance - Patient > 75% Assistive device: Walker-rolling Max distance: 50   Walk 10 feet activity   Assist  Walk 10 feet activity did not occur: Safety/medical concerns  Assist level: Minimal Assistance - Patient > 75% Assistive device: Walker-rolling   Walk 50 feet activity   Assist Walk 50 feet with 2 turns activity did not occur: Safety/medical concerns  Assist level: Minimal Assistance - Patient > 75% Assistive device: Walker-rolling    Walk 150 feet activity   Assist Walk 150 feet activity did not occur: Safety/medical concerns         Walk 10 feet on uneven surface  activity   Assist Walk 10 feet on uneven surfaces activity did not occur: Safety/medical concerns         Wheelchair     Assist Is the patient using a wheelchair?: Yes Type of Wheelchair: Manual Wheelchair activity did not occur: Safety/medical concerns  Wheelchair assist level: Supervision/Verbal cueing Max wheelchair distance: 136f    Wheelchair 50 feet with 2 turns activity    Assist    Wheelchair 50 feet with 2 turns activity did not occur: Safety/medical concerns   Assist Level: Supervision/Verbal cueing   Wheelchair 150 feet activity     Assist  Wheelchair 150 feet activity did not occur: Safety/medical concerns   Assist Level: Supervision/Verbal cueing   Blood pressure 108/73, pulse 65, temperature 98.9 F (37.2 C), temperature source Oral, resp. rate 17, height _0  (1.778 m), weight 76.3 kg, SpO2 96 %.  Medical  Problem List and Plan: 1.  Decline in mobility and ADLs secondary to LLE radiculopathy resulting in septic lumbar discitis with LE weakness/myelopathy  D/c date 11/1- however concerned about pt's worsening weakness- hold therapy today and restart in AM if MRI ok- 2.  Antithrombotics: -DVT/anticoagulation:  Mechanical: Sequential compression devices, below knee Bilateral lower extremities  Lovenox on board             -antiplatelet therapy: N/A 3. Pain: still has stabbing/burning bilateral hip/pelvic pain.  Continue robaxin 1000 mg TID.              --MS contin was added 10/17 in addition to prn oxycodone 10/24- changed Exalgo back to MS contin  30 mg B ID per pt request- will wait on changing to percocet.   10/25- pain is doing better per pt with MS Contin- will con't current regimen and monitor- also c/o more muscle spasms- will add Baclofen 5 mg TID; allergic to Zanaflex- 10/26- will increase ms contin to 45 mg BID- maintain oxy- but advised pt would like him to take less- cannot get him these doses out of hospital.   4. Mood: LCSW to follow for evaluation and support. Team to provide ego support.              -antipsychotic agents: N/a 5. Neuropsych: This patient is capable of making decisions on his own behalf. 6. Skin/Wound Care: Routine pressure relief measures.  7. Fluids/Electrolytes/Nutrition: Monitor I/Os 8. 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 --Antibiotics to continue for 6 weeks? -lumbar wound cultures without growth.  10/19- ID - will need IV Abx for 8 weeks from 10/11- surgery date- changed Vanc to IV Dapto and con't Rifampin 10/24- CRP up slightly and ESR up 10 points as well- will recheck next Monday and if still elevating, will call ID. 10/26- will check in AM- since leaving next Tuesday  10/27- ESR 66- up; CRP 12.4- up; and WBC 10.8- up- also has worsening weakness- called ID and ordered MRI of lumbar spine with and without  contrast and called Dr Ellene Route- will see what results are.  9. Low grade fevers: Resolved  10/27- having again- will monitor- as per #8 10. T2DM with peripheral neuropathy/ Hgb A1c-8.4  (was 10.2  01/2021). Will monitor BS ac/hs  --ON insulin glargine  8 units and metformin daily.             --Use SSI for elevated BS/tighter control to help promote wound healing.   10/25- BG's look good- con't regimen 11. Episodic Hypotension: Has resolved. Continue to monitor BP TID.  12. Hyponatremia: Monitor as continue to vary from 130-133 13. Acute blood loss anemia: Monitor H/H for trend/recovery.  Hemoglobin 11.5 on 10/18 14. Leucocytosis: Monitor for fevers and other signs of worsening of infection  WBCs 11.7 on 10/18  10/24- WBC 9.8- con't to monitor  10/27- WBC 10.8- per #8             --monitor wounds for healing.  15. Low protein stores:Continue prosource BID. Ensure max once daily.              --Continue vitamins/Zinc also.  51. Dispo   10/26- addressed pt's behavior with other staff members- I need him to be more polite  10/27- explained to pt will likely need to stay longer- and apologized, but explained this could be serious and needs to be addressed.    I spent a total of 41 minutes on visit total- >50% coordination of care- d/w ID and nursing about MRI and Ativan IV for MRI and NSU-     LOS: 10 days A FACE TO FACE EVALUATION WAS PERFORMED  Maebry Obrien 06/21/2021, 10:15 AM

## 2021-06-21 NOTE — Progress Notes (Signed)
Occupational Therapy Note  Patient Details  Name: John Ware MRN: [id number] Date of Birth: 09/09/1965  Today's Date: 06/21/2021 OT Missed Time: 30 Minutes Missed Time Reason: CT/MRI  Pt just returned from MRI of hip and declined therapy at this time. Pt still uncomfortable from MRI. Will check back when time available.    Lavone Neri [facility] 06/21/2021, 2:19 PM

## 2021-06-21 NOTE — Discharge Instructions (Addendum)
Inpatient Rehab Discharge Instructions  John Ware Discharge date and time: 06/26/21   Activities/Precautions/ Functional Status: Activity: no lifting, driving, or strenuous exercise till cleared by MD Diet: diabetic diet Wound Care: keep wound clean and dry   Functional status:  ___ No restrictions     ___ Walk up steps independently _X__ 24/7 supervision/assistance   ___ Walk up steps with assistance ___ Intermittent supervision/assistance  ___ Bathe/dress independently ___ Walk with walker     _X__ Bathe/dress with assistance ___ Walk Independently    ___ Shower independently ___ Walk with assistance    ___ Shower with assistance _X__ No alcohol     ___ Return to work/school ________    Special Instructions:  COMMUNITY REFERRALS UPON DISCHARGE:    Home Health:   PT & RN                Agency: [facility] Phone:[phone]   Medical Equipment/Items Ordered:WHEELCHAIR                                                 Agency/Supplier:[facility]  [phone]   My questions have been answered and I understand these instructions. I will adhere to these goals and the provided educational materials after my discharge from the hospital.  Patient/Caregiver Signature _______________________________ Date __________  Clinician Signature _______________________________________ Date __________  Please bring this form and your medication list with you to all your follow-up doctor's appointments.

## 2021-06-21 NOTE — Progress Notes (Signed)
[facility]  Date of Admission:  06/11/2021     Total days of antibiotics 24         ASSESSMENT:  John Ware has developed worsened lower extremity weakness since John Ware was last seen on 06/13/21.  MRI lumbar spine appears primarily unchanged from previous and awaiting review by Neurosurgery. Will continue with current dose of Daptomycin which was found to be sensitive from last cultures. Await any potential surgical intervention per Neurosurgery. CK levels stable. Remaining medical care per primary team.   PLAN:  Continue current dose of Daptomycin.  Await neurosurgery evaluation. Monitor CK levels per protocol for therapeutic drug monitoring.  Remaining medical and supportive care per primary team.   Principal Problem:   Septic discitis of lumbar region Active Problems:   Acute blood loss anemia   Diabetic peripheral neuropathy (HCC)   Bacteremia   Postoperative pain    acetaminophen  650 mg Oral Q6H   vitamin C  500 mg Oral BID   baclofen  5 mg Oral TID   Chlorhexidine Gluconate Cloth  6 each Topical Daily   diclofenac  75 mg Oral BID   enoxaparin (LOVENOX) injection  40 mg Subcutaneous Q24H   gabapentin  100 mg Oral BID   gabapentin  800 mg Oral QHS   insulin aspart  0-5 Units Subcutaneous QHS   insulin aspart  0-9 Units Subcutaneous TID WC   insulin glargine-yfgn  8 Units Subcutaneous Daily   metFORMIN  1,500 mg Oral Q breakfast   methocarbamol  1,000 mg Oral TID   morphine  45 mg Oral Q12H   multivitamin with minerals  1 tablet Oral Daily   Ensure Max Protein  11 oz Oral QHS   rifampin  300 mg Oral Q12H   senna-docusate  2 tablet Oral Daily   zinc sulfate  220 mg Oral Daily    SUBJECTIVE:  John Ware was last seen on 06/13/21 with plan for 8 weeks of Daptomycin with end date of 07/31/21.  Noted to have worsening RLE weakness starting on 06/20/21 with near fall earlier today. Inflammatory markers remained elevated with MRI ordered  and requests for evaluation from ID and Neurosurgery.    John Ware has been experiencing increased weakness of his right lower extremity with continued left lower extremity weakness. No fevers or chills. Has concern since antibiotics were changed to once daily. No fevers or chills and denies any saddle anesthesia or changes to bowel or bladder habits.   Allergies  Allergen Reactions   Tizanidine Other (See Comments)    Leg cramps     Review of Systems: Review of Systems  Constitutional:  Negative for chills, fever and weight loss.  Respiratory:  Negative for cough, shortness of breath and wheezing.   Cardiovascular:  Negative for chest pain and leg swelling.  Gastrointestinal:  Negative for abdominal pain, constipation, diarrhea, nausea and vomiting.  Skin:  Negative for rash.  Neurological:  Positive for weakness.     OBJECTIVE: Vitals:   06/20/21 2009 06/21/21 0540 06/21/21 0543 06/21/21 1307  BP: 110/71  108/73 113/85  Pulse: 81 74 65 74  Resp: 20  17 18   Temp: 99.4 F (37.4 C)  98.9 F (37.2 C) 98.7 F (37.1 C)  TempSrc: Oral  Oral Oral  SpO2: 96% 96% 96% 100%  Weight:      Height:       Body mass index is 24.14 kg/m.  Physical Exam Constitutional:  General: John Ware is not in acute distress.    Appearance: John Ware is well-developed.     Comments: Lying in bed.  Cardiovascular:     Rate and Rhythm: Normal rate and regular rhythm.     Heart sounds: Normal heart sounds.  Pulmonary:     Effort: Pulmonary effort is normal.     Breath sounds: Normal breath sounds.  Musculoskeletal:     Comments: Left hip flexion is 2+ and unable to lift off bed. Right leg is 3+ for hip flexion and can lift against gravity. Dorsiflexion and planter flexion are intact.   Skin:    General: Skin is warm and dry.  Neurological:     Mental Status: John Ware is alert and oriented to person, place, and time.    Lab Results Lab Results  Component Value Date   WBC 10.8 (H) 06/21/2021   HGB  10.9 (L) 06/21/2021   HCT 34.0 (L) 06/21/2021   MCV 81.7 06/21/2021   PLT 525 (H) 06/21/2021    Lab Results  Component Value Date   CREATININE 0.48 (L) 06/18/2021   BUN 7 06/18/2021   NA 136 06/18/2021   K 4.1 06/18/2021   CL 97 (L) 06/18/2021   CO2 29 06/18/2021    Lab Results  Component Value Date   ALT 10 06/12/2021   AST 11 (L) 06/12/2021   ALKPHOS 80 06/12/2021   BILITOT 0.3 06/12/2021     Microbiology: No results found for this or any previous visit (from the past 240 hour(s)).   Marcos Eke, NP [facility]  06/21/2021  1:30 PM

## 2021-06-21 NOTE — Progress Notes (Signed)
Physical Therapy Session Note 25mn nmissed time due to MRI  Patient Details  Name: John MCELHINEYMRN: [id number] of Birth: 1July 20, 1967 Today's Date: 06/21/2021 PT Individual Time:  -      Short Term Goals: Week 1:  PT Short Term Goal 1 (Week 1): Pt will initate gait training PT Short Term Goal 1 - Progress (Week 1): Met PT Short Term Goal 2 (Week 1): Pt will self-propel w/c x 100 ft PT Short Term Goal 2 - Progress (Week 1): Met PT Short Term Goal 3 (Week 1): Pt will perform STS with min A PT Short Term Goal 3 - Progress (Week 1): Partly met Week 2:  PT Short Term Goal 1 (Week 2): =LTGs d/t ELOS Week 3:     Skilled Therapeutic Interventions/Progress Updates:    Pt w/transport for MRI this am.    Therapy Documentation Precautions:  Precautions Precautions: Fall, Back Precaution Comments: recalled 3/3 precautions Restrictions Weight Bearing Restrictions: No    Therapy/Group: Individual Therapy BCallie Fielding [place]10/27/2022, 9:18 AM

## 2021-06-21 NOTE — Progress Notes (Addendum)
Pt to delay OOB mobility at this time per MD verbal order. Pt declines bed level therapies at this time d/t pain. Pt left supine with needs met, alarm on and call bell nearby. 45 minutes missed - will attempt to make up time as able.

## 2021-06-21 NOTE — Progress Notes (Signed)
Patient is A&O x 4 and able to make his needs known. Patient requests PRN and scheduled pain medications be given before PT / OT. Patient rates his overall pain scale 2/10 prior to getting medications however still insisted on all PRN pain medications. Upon recheck patient now rates pain 5/10. Increased pain in his back and legs. Dr new order MRI with PRN Ativan 2 mg IV to be given prior to MRI due to claustrophobia. Ativan given when transport arrived. Upon return to the floor the patient again requested PRN OXY 20 mg. Patient unable to receive OXY again until after 12:10. Patient called promptly at 12:10 for his PRN pain medications. Rates pain 7/10. T approximately 1 PM patient again called requesting PRN OXY. Writer notified patient that he had just received his PRN OXY 20 mg. Patient states that he forgot. Helped patient reposition and turned lights down low for relaxation. Call light and personal items within reach. Bed in low position, safety devices on place.

## 2021-06-21 NOTE — Progress Notes (Signed)
Physical Therapy Session Note  Patient Details  Name: John Ware MRN: [id number] Date of Birth: 27-Oct-1965  Today's Date: 06/21/2021 PT Individual Time: 0803-0859 PT Individual Time Calculation (min): 56 min   Short Term Goals: Week 1:  PT Short Term Goal 1 (Week 1): Pt will initate gait training PT Short Term Goal 1 - Progress (Week 1): Met PT Short Term Goal 2 (Week 1): Pt will self-propel w/c x 100 ft PT Short Term Goal 2 - Progress (Week 1): Met PT Short Term Goal 3 (Week 1): Pt will perform STS with min A PT Short Term Goal 3 - Progress (Week 1): Partly met Week 2:  PT Short Term Goal 1 (Week 2): =LTGs d/t ELOS  Skilled Therapeutic Interventions/Progress Updates:    pt received in bed and agreeable to therapy. Bed mobility with supervision and bed features. Pt reports 2/10 pain at rest, nsg administered medication during session. Pt donned shoes with min A. Min A Sit to stand from elevated bed and min A stand pivot transfer. Pt propelled w/c with BUE to day room. Pt directed in gait with RW and min A with close w/c follow  50 ft. [place] step to pattern progressed to step through pattern with VC. Mild knee buckling but pt able to recover with no more than min A. Second bout x 26 ft, when pt's knees buckled. Therapist was unable to get w/c close enough while pt was making turn. Pt came to deep squat/half kneel position. Nearby RN assisted in dependent transfer back to w/c. Therapist assessed for immediate signs of injury, none found. Pt reported increase in hip pain to 5/10, and stretching feeling in calf, quad and shoulder, but no other physical injury. Pt expressed fear at standing again, but agreed with +2 assist. Pt performed min A stand pivot transfer to nustep with +2 present for safety. Nustep at level 5 for 5 min, pt reporting that he feels it is easier to keep his legs in alignment even with higher level. Pt returned to room and to bed with min a stand pivot transfer, supervision  bed mobility, and was left with all needs in reach and alarm active.   Therapy Documentation Precautions:  Precautions Precautions: Fall, Back Precaution Comments: recalled 3/3 precautions Restrictions Weight Bearing Restrictions: No General: PT Amount of Missed Time (min): 45 Minutes PT Missed Treatment Reason: CT/MRI     Therapy/Group: Individual Therapy  Mickel Fuchs 06/21/2021, 1:00 PM

## 2021-06-22 DIAGNOSIS — R7881 Bacteremia: Secondary | ICD-10-CM | POA: Diagnosis not present

## 2021-06-22 DIAGNOSIS — M4646 Discitis, unspecified, lumbar region: Secondary | ICD-10-CM | POA: Diagnosis not present

## 2021-06-22 LAB — GLUCOSE, CAPILLARY
Glucose-Capillary: 120 mg/dL — ABNORMAL HIGH (ref 70–99)
Glucose-Capillary: 162 mg/dL — ABNORMAL HIGH (ref 70–99)
Glucose-Capillary: 135 mg/dL — ABNORMAL HIGH (ref 70–99)
Glucose-Capillary: 161 mg/dL — ABNORMAL HIGH (ref 70–99)

## 2021-06-22 MED ORDER — OXYCODONE-ACETAMINOPHEN 5-325 MG PO TABS
2.0000 | ORAL_TABLET | Freq: Four times a day (QID) | ORAL | Status: DC | PRN
Start: 2021-06-22 — End: 2021-06-26
  Administered 2021-06-22 – 2021-06-26 (×15): 2 via ORAL
  Filled 2021-06-22 (×15): qty 2

## 2021-06-22 MED ORDER — VANCOMYCIN HCL 1250 MG/250ML IV SOLN
1250.0000 mg | Freq: Two times a day (BID) | INTRAVENOUS | Status: DC
  Administered 2021-06-22 – 2021-06-26 (×8): 1250 mg via INTRAVENOUS
  Filled 2021-06-22 (×9): qty 250

## 2021-06-22 MED ORDER — VANCOMYCIN HCL 1250 MG/250ML IV SOLN
1250.0000 mg | Freq: Three times a day (TID) | INTRAVENOUS | Status: DC
Start: 2021-06-22 — End: 2021-06-22
  Filled 2021-06-22 (×2): qty 250

## 2021-06-22 NOTE — Progress Notes (Signed)
Physical Therapy Session Note  Patient Details  Name: [name] MRN: [id number] Date of Birth: [birth year]  Today's Date: 06/22/2021 PT Individual Time: 4753-3917 PT Individual Time Calculation (min): 69 min   Short Term Goals: Week 1:  PT Short Term Goal 1 (Week 1): Pt will initate gait training PT Short Term Goal 1 - Progress (Week 1): Met PT Short Term Goal 2 (Week 1): Pt will self-propel w/c x 100 ft PT Short Term Goal 2 - Progress (Week 1): Met PT Short Term Goal 3 (Week 1): Pt will perform STS with min A PT Short Term Goal 3 - Progress (Week 1): Partly met Week 2:  PT Short Term Goal 1 (Week 2): =LTGs d/t ELOS  Skilled Therapeutic Interventions/Progress Updates:    Pt seated in w/c on arrival and agreeable to therapy. Pt reports 3/10 pain during session, premedicated and rest breaks as needed. Pt propelled w/c with BUE to [place] entrance, including Teacher, adult education with supervision. Pt performed the following exercises to promote strength and endurance:  -seated marches 3 x 20 -seated LAQ 4 x 10 -seated toe/heel raises 2x20 -active assisted ER shoulder stretch with 1lb dowel 2 x 20 -active assist shoulder flexion 2 x 10  Pt propelled chair back to unit in the same manner and remained seated in chair at end of session, pt was left with all needs in reach and alarm active.    Therapy Documentation Precautions:  Precautions Precautions: Fall, Back Precaution Comments: recalled 3/3 precautions Restrictions Weight Bearing Restrictions: No General:     Therapy/Group: Individual Therapy  Mickel Fuchs 06/22/2021, 2:36 PM

## 2021-06-22 NOTE — Progress Notes (Signed)
[facility]  Date of Admission:  06/11/2021     Lines: 10/26-c rue picc  Abx: 10/17-c daptomycin 10/14-c rifampin  10/14-17 vancomycin 10/03-14 cefazolin  Outpatient cephalexin suppression  ASSESSMENT: New imaging evidence phlegmon/abscess L1-L3 Hx mssa septicemia (tv endocarditis, septic pulm emboli, TL discitis/OM, psoas abscesses, bilateral shoulder septic arthritis) 01/2021 Lumbar hardware complicating OM/epidural abscess Secondary left shoulder pseudomonal septic arthritis s/p 4 weeks cefepime Dm2   Microbiologic history: 10/11 operative lumbar area cx negative 10/07 bcx negative 10/03 bcx mrsa (S dapto) 7/6 right shoulder NGTD 7/6 left shoulder PsA -Pansensitive 6/23 left subscapular abscess= abundant MSSA 6/23 left shoulder =MSSA 6/22 blood cx NGTD 6/18 left chest wall = MSSA 6/8 blood cx = MSSA     55 yo male dm2, recent mssa septicemia in 01/2021 as above, including a left septic shoulder started with mssa bursitis but superimposed pseudomonal joint infection, s/p I&D, hardware in lumbar spine, on chronic cephalexin suppression abx, seen in ED on 10/03 for worsening back pain with left sided weakness/decreased sensation with repeat mri showing new phlegmon around L1-3 area   Interestingly his admission blood culture grew mrsa Repeat bcx negative Tte negative Patient underwent I&D and laminectomy on 1011 (L1-2 and L2-3); previous hardware retained; culture negative  ------------- 10/28 assessment He was initially switched from cefazolin to vancomycin (3 days) then daptomycin since, along with rifampin The past few days wbc increasing along with inflammatory markers and worsening bilateral LLE>RLE weakness with a fall in the rehab center Repeat mri lumbar spine 10/27 is reassuring in terms of no worsening abscess or new abscess finding Picc was placed 2 days ago; no repeat bcx done but no sepsis otherwise to suggest new  nosocomial process/clabsi  While daptomycin susceptibility was confirmed on blood cx, patient can have mrsa developing resistant to daptomycin during treatment. Vancomycin would be more durable in this regard  Given rising crp/worsening lower ext weakness, will change daptomycin to vanc. Continue rifampin    PLAN: Stop daptomycin Continue rifampin 300 mg po bid Start vancomycin  I adjusted the opat order to reflect vancomycin Continue weekly cbc, cmp, vanc trough/auc, crp Patient will need to stay for the next several days to evaluate treatment efficacy Dr Daiva Eves to resume care this weekend  Discussed with primary team   Principal Problem:   Septic discitis of lumbar region Active Problems:   Transient weakness of right lower extremity   Acute blood loss anemia   Diabetic peripheral neuropathy (HCC)   Bacteremia   Postoperative pain   MRSA infection   Allergies  Allergen Reactions   Tizanidine Other (See Comments)    Leg cramps    Scheduled Meds:  acetaminophen  650 mg Oral Q6H   vitamin C  500 mg Oral BID   baclofen  5 mg Oral TID   Chlorhexidine Gluconate Cloth  6 each Topical Daily   diclofenac  75 mg Oral BID   enoxaparin (LOVENOX) injection  40 mg Subcutaneous Q24H   gabapentin  100 mg Oral BID   gabapentin  800 mg Oral QHS   insulin aspart  0-5 Units Subcutaneous QHS   insulin aspart  0-9 Units Subcutaneous TID WC   insulin glargine-yfgn  8 Units Subcutaneous Daily   metFORMIN  1,500 mg Oral Q breakfast   methocarbamol  1,000 mg Oral TID   morphine  45 mg Oral Q12H   multivitamin with minerals  1 tablet Oral Daily  Ensure Max Protein  11 oz Oral QHS   rifampin  300 mg Oral Q12H   senna-docusate  2 tablet Oral Daily   zinc sulfate  220 mg Oral Daily   Continuous Infusions: PRN Meds:.acetaminophen, alum & mag hydroxide-simeth, bisacodyl, diphenhydrAMINE, guaiFENesin-dextromethorphan, menthol-cetylpyridinium **OR** phenol, naLOXone (NARCAN)  injection,  oxyCODONE-acetaminophen, polyethylene glycol, prochlorperazine **OR** prochlorperazine **OR** prochlorperazine, sodium chloride flush, sodium phosphate, traZODone   SUBJECTIVE: No fever, chill Feels well Back pain better but lower ext weakness worse No incontinence  Inflammatory markers up No n/v/diarrhea Picc site ok No cough/chest pain/headache  Review of Systems: ROS All other ROS was negative, except mentioned above     OBJECTIVE: Vitals:   06/21/21 0543 06/21/21 1307 06/21/21 2022 06/22/21 0447  BP: 108/73 113/85 126/87 96/72  Pulse: 65 74 88 (!) 58  Resp: 17 18 14 14   Temp: 98.9 F (37.2 C) 98.7 F (37.1 C) 98.7 F (37.1 C) 97.7 F (36.5 C)  TempSrc: Oral Oral Oral Oral  SpO2: 96% 100% 96% 96%  Weight:      Height:       Body mass index is 24.14 kg/m.  Physical Exam General/constitutional: no distress, pleasant HEENT: Normocephalic, PER, Conj Clear, EOMI, Oropharynx clear Neck supple CV: rrr no mrg Lungs: clear to auscultation, normal respiratory effort Abd: Soft, Nontender Ext: no edema Skin: No Rash Neuro: LLE >> RLE weakness proximally 3/5 strength roughly MSK: no peripheral joint swelling/tenderness/warmth; back spines nontender   Central line presence: picc site no erythema/purulence   Lab Results Lab Results  Component Value Date   WBC 10.8 (H) 06/21/2021   HGB 10.9 (L) 06/21/2021   HCT 34.0 (L) 06/21/2021   MCV 81.7 06/21/2021   PLT 525 (H) 06/21/2021    Lab Results  Component Value Date   CREATININE 0.48 (L) 06/18/2021   BUN 7 06/18/2021   NA 136 06/18/2021   K 4.1 06/18/2021   CL 97 (L) 06/18/2021   CO2 29 06/18/2021    Lab Results  Component Value Date   ALT 10 06/12/2021   AST 11 (L) 06/12/2021   ALKPHOS 80 06/12/2021   BILITOT 0.3 06/12/2021      Microbiology: No results found for this or any previous visit (from the past 240 hour(s)).   Serology:   Imaging: If present, new imagings (plain films, ct scans,  and mri) have been personally visualized and interpreted; radiology reports have been reviewed. Decision making incorporated into the Impression / Recommendations.  10/27 mri lumbar spine 1. No substantial change in findings of discitis/osteomyelitis at L1-L2 and L2-L3, described above. 2. Similar enhancing ventral epidural fluid/phlegmon at the L1 and L2 levels, measuring up to 5 mm in thickness. Similar moderate canal stenosis at L1-L2 and L2-L3. 3. Similar paraspinal myositis and small bilateral psoas abscesses.     11/27, MD [facility] [phone] pager    06/22/2021, 12:05 PM

## 2021-06-22 NOTE — Progress Notes (Signed)
PHARMACY CONSULT NOTE FOR:  OUTPATIENT  PARENTERAL ANTIBIOTIC THERAPY (OPAT)  Indication: Vertebral osteo w/ hardware Regimen: Vanco 1250 mg iv q12h Rifampin 300 mg PO BID End date: 07/31/21  IV antibiotic discharge orders are pended. To discharging provider:  please sign these orders via discharge navigator,  Select New Orders & click on the button choice - Manage This Unsigned Work.     Thank you for allowing pharmacy to be a part of this patient's care.  John Ware BS, PharmD, BCPS Clinical Pharmacist   Please check [email or web address] for unit-specific pharmacy phone numbers

## 2021-06-22 NOTE — Progress Notes (Signed)
Physical Therapy Session Note  Patient Details  Name: John Ware MRN: [id number] Date of Birth: 1965/09/08  Today's Date: 06/22/2021 PT Individual Time: 0900-0956 PT Individual Time Calculation (min): 56 min   Short Term Goals: Week 1:  PT Short Term Goal 1 (Week 1): Pt will initate gait training PT Short Term Goal 1 - Progress (Week 1): Met PT Short Term Goal 2 (Week 1): Pt will self-propel w/c x 100 ft PT Short Term Goal 2 - Progress (Week 1): Met PT Short Term Goal 3 (Week 1): Pt will perform STS with min A PT Short Term Goal 3 - Progress (Week 1): Partly met Week 2:  PT Short Term Goal 1 (Week 2): =LTGs d/t ELOS  Skilled Therapeutic Interventions/Progress Updates:    Pt seated in w/c on arrival and agreeable to therapy. Pt reported 2/10 pain at rest, up to 4/10 with activity, premedicated. Pt propelled w/c with BUE to day room for endurance and functional mobility. No standing activity per MD orders.   Pt performed the following exercises to promote LE strength and endurance:  Kinetron: 4x2 min at 20 cm/sec for LE strength, with 1 min rest breaks. 3X5 min at 70 cm/sec for cardiovascular and LE endurance, with 2 min rest break  Pt reported some muscular pain/discomfort, but states it is different and more tolerable than previously. Pt remained in w/c at end of session and was left with all needs in reach and alarm active.     Therapy Documentation Precautions:  Precautions Precautions: Fall, Back Precaution Comments: recalled 3/3 precautions Restrictions Weight Bearing Restrictions: No General:       Therapy/Group: Individual Therapy  Mickel Fuchs 06/22/2021, 9:23 AM

## 2021-06-22 NOTE — Progress Notes (Signed)
Pharmacy Antibiotic Note  John Ware is a 55 y.o. male on CIR with  vertebral osteo w/ hardware involvement .  Pharmacy has been consulted for vancomycin dosing.  Plan: Daptomycin discontinued by ID provider. Rifampin continued Vancomycin 1250 mg IV Q 12 hrs. Goal AUC 400-550. Expected AUC: 454.8 SCr used: 0.8  Continue therapy for a total of 6 weeks (end date 07/31/2021)  Height: 5\' 10"  (177.8 cm) Weight: 76.3 kg (168 lb 3.4 oz) IBW/kg (Calculated) : 73  Temp (24hrs), Avg:98.2 F (36.8 C), Min:97.7 F (36.5 C), Max:98.7 F (37.1 C)  Recent Labs  Lab 06/18/21 0326 06/21/21 0224  WBC 9.8 10.8*  CREATININE 0.48*  --     Estimated Creatinine Clearance: 110.3 mL/min (A) (by C-G formula based on SCr of 0.48 mg/dL (L)).    Allergies  Allergen Reactions   Tizanidine Other (See Comments)    Leg cramps   OPAT entered for vanco/rifampin to 07/31/2021 (vanco and rifampin)  Monitor Bmet q72h, CBC weekly.  Thank you for allowing pharmacy to be a part of this patient's care.  Legrand Lasser BS, PharmD, BCPS Clinical Pharmacist

## 2021-06-22 NOTE — Progress Notes (Signed)
Patient ID: John Ware, male   DOB: [birth year], 55 y.o.   MRN: [id number] I had reviewed the most recent MRI performed yesterday and discussed the situation with Dr. Lindell Noe.  The MRI essentially is unchanged from his postop MRI that I obtained on 15 October.  I note the decline in function that he has had with worsening right lower extremity weakness and left lower extremity weakness that seems a bit worse than it had been.  At this time I would continue to observe him clinically.  I understand that antibiotic changes are being made.

## 2021-06-22 NOTE — Progress Notes (Signed)
PROGRESS NOTE   Subjective/Complaints:  Weakness in legs about the same-  Wants to change back to percocet- knows will only get 4x/day, but wants percocet over oxycodone- will also stop scheduled tylenol.   Also says oxycodone "makes him out of it" when Percocet doesn't.   Spoke with ID- they wants ot change back to Vanc- and d/c Daptomycin.   ROS:   Pt denies SOB, abd pain, CP, N/V/C/D, and vision changes   Objective:   MR Lumbar Spine W Wo Contrast  Result Date: 06/21/2021 CLINICAL DATA:  Spinal cord injury, follow up worsening weakness- in lumbar discitis- s/p previous surgery EXAM: MRI LUMBAR SPINE WITHOUT AND WITH CONTRAST TECHNIQUE: Multiplanar and multiecho pulse sequences of the lumbar spine were obtained without and with intravenous contrast. CONTRAST:  55m GADAVIST GADOBUTROL 1 MMOL/ML IV SOLN COMPARISON:  MRI June 09, 2021. FINDINGS: Segmentation:  Standard.  Same numbering as on prior MRI. Alignment:  Unchanged grade 1 anterolisthesis of L5 on S1. Vertebrae: Postsurgical changes of L3-S1 posterior interbody fusion. Similar active discitis/osteomyelitis at L1-L2 and L2-L3 with marrow edema and enhancement and enhancing endplates with disc edema. No significant change and erosive change of the endplates. No substantial change and enhancing ventral epidural phlegmon at the L1-L2 levels, measuring up to 5 mm in thickness (series 12, image 11). Decompressive laminectomies on the left at L1-L2 and L2-L3. Conus medullaris and cauda equina: Conus extends to the superior L1 level. No evidence of abnormal signal within the conus. Paraspinal and other soft tissues: Similar extensive edema within bilateral psoas musculature with associated enhancement, compatible with paraspinal mastitis. No substantial change and elongated fluid collection in the right psoas muscle. No substantial change in presumed postoperative seroma posterior to  the L4 and L5 vertebral bodies. Disc levels: T12-L1: Right paracentral disc protrusion without high-grade stenosis. No change. L1-L2: Left laminectomy. Moderate canal stenosis, similar. Severe bilateral foraminal stenosis, similar. L2-L3: Left laminectomy. Moderate canal stenosis, similar. Severe bilateral foraminal stenosis, similar. L3-L4: Prior fusion and posterior decompression. No evidence of significant residual stenosis. No change. L4-L5: Prior fusion and posterior decompression. I no evidence of significant residual canal or foraminal stenosis. No change. L5-S1: Similar grade 1 anterolisthesis. Prior fusion and posterior decompression. No residual canal stenosis. Similar degree of bilateral foraminal stenosis. IMPRESSION: 1. No substantial change in findings of discitis/osteomyelitis at L1-L2 and L2-L3, described above. 2. Similar enhancing ventral epidural fluid/phlegmon at the L1 and L2 levels, measuring up to 5 mm in thickness. Similar moderate canal stenosis at L1-L2 and L2-L3. 3. Similar paraspinal myositis and small bilateral psoas abscesses. Findings discussed with Dr. HKristeen MissMD via telephone at 12:34 p.m. Electronically Signed   By: FMargaretha SheffieldM.D.   On: 06/21/2021 13:13   UKoreaEKG Site Rite  Result Date: 06/20/2021 If Site Rite image not attached, placement could not be confirmed due to current cardiac rhythm.  Recent Labs    06/21/21 0224  WBC 10.8*  HGB 10.9*  HCT 34.0*  PLT 525*    No results for input(s): NA, K, CL, CO2, GLUCOSE, BUN, CREATININE, CALCIUM in the last 72 hours.    Intake/Output Summary (Last 24 hours) at 06/22/2021 1250  Last data filed at 06/22/2021 0827 Gross per 24 hour  Intake 480 ml  Output 1250 ml  Net -770 ml     Pressure Injury 06/11/21 Buttocks Left Stage 1 -  Intact skin with non-blanchable redness of a localized area usually over a bony prominence. (Active)  06/11/21 1617  Location: Buttocks  Location Orientation: Left  Staging:  Stage 1 -  Intact skin with non-blanchable redness of a localized area usually over a bony prominence.  Wound Description (Comments):   Present on Admission: Yes     Pressure Injury 06/11/21 Buttocks Right Stage 1 -  Intact skin with non-blanchable redness of a localized area usually over a bony prominence. (Active)  06/11/21 1618  Location: Buttocks  Location Orientation: Right  Staging: Stage 1 -  Intact skin with non-blanchable redness of a localized area usually over a bony prominence.  Wound Description (Comments):   Present on Admission: Yes    Physical Exam: Vital Signs Blood pressure 96/72, pulse (!) 58, temperature 97.7 F (36.5 C), temperature source Oral, resp. rate 14, height '5\' 10"'  (1.778 m), weight 76.3 kg, SpO2 96 %.         General: awake, alert, appropriate, laying in bed; brighter NAD HENT: conjugate gaze; oropharynx moist CV: regular rate; no JVD Pulmonary: CTA B/L; no W/R/R- good air movement GI: soft, NT, ND, (+)BS Psychiatric: appropriate- brighter Neurological: Ox3  RLE_ HF 2/5; KE 4-/5, DF 4-/5 and PF 4/5 LLE- HF 1/5 at best; KE 1/5 at best; DF 3-/5 and PF 3/5  Skin: Warm and dry.  Intact. Back incision scabbed over- looks great- PICC line looks ok,  Musc: No edema in extremities.  No tenderness in extremities. Neuro: Alert Bilateral upper extremities: 5/5 proximal distal Left lower extremity: Hip flexion, knee extension 0/5, ankle dorsiflexion 4-/5  Assessment/Plan: 1. Functional deficits which require 3+ hours per day of interdisciplinary therapy in a comprehensive inpatient rehab setting. Physiatrist is providing close team supervision and 24 hour management of active medical problems listed below. Physiatrist and rehab team continue to assess barriers to discharge/monitor patient progress toward functional and medical goals  Care Tool:  Bathing    Body parts bathed by patient: Right arm, Left arm, Chest, Abdomen, Right upper leg, Left  upper leg, Face, Front perineal area, Buttocks   Body parts bathed by helper: Right lower leg, Left lower leg     Bathing assist Assist Level: Minimal Assistance - Patient > 75%     Upper Body Dressing/Undressing Upper body dressing   What is the patient wearing?: Pull over shirt    Upper body assist Assist Level: Set up assist    Lower Body Dressing/Undressing Lower body dressing      What is the patient wearing?: Pants, Underwear/pull up     Lower body assist Assist for lower body dressing: Minimal Assistance - Patient > 75%     Toileting Toileting    Toileting assist Assist for toileting: Minimal Assistance - Patient > 75%     Transfers Chair/bed transfer  Transfers assist     Chair/bed transfer assist level: Moderate Assistance - Patient 50 - 74%     Locomotion Ambulation   Ambulation assist   Ambulation activity did not occur: Safety/medical concerns  Assist level: Minimal Assistance - Patient > 75% Assistive device: Walker-rolling Max distance: 50   Walk 10 feet activity   Assist  Walk 10 feet activity did not occur: Safety/medical concerns  Assist level: Minimal Assistance - Patient > 75%  Assistive device: Walker-rolling   Walk 50 feet activity   Assist Walk 50 feet with 2 turns activity did not occur: Safety/medical concerns  Assist level: Minimal Assistance - Patient > 75% Assistive device: Walker-rolling    Walk 150 feet activity   Assist Walk 150 feet activity did not occur: Safety/medical concerns         Walk 10 feet on uneven surface  activity   Assist Walk 10 feet on uneven surfaces activity did not occur: Safety/medical concerns         Wheelchair     Assist Is the patient using a wheelchair?: Yes Type of Wheelchair: Manual Wheelchair activity did not occur: Safety/medical concerns  Wheelchair assist level: Supervision/Verbal cueing Max wheelchair distance: 171f    Wheelchair 50 feet with 2 turns  activity    Assist    Wheelchair 50 feet with 2 turns activity did not occur: Safety/medical concerns   Assist Level: Supervision/Verbal cueing   Wheelchair 150 feet activity     Assist  Wheelchair 150 feet activity did not occur: Safety/medical concerns   Assist Level: Supervision/Verbal cueing   Blood pressure 96/72, pulse (!) 58, temperature 97.7 F (36.5 C), temperature source Oral, resp. rate 14, height '5\' 10"'  (1.778 m), weight 76.3 kg, SpO2 96 %.  Medical Problem List and Plan: 1.  Decline in mobility and ADLs secondary to LLE radiculopathy resulting in septic lumbar discitis with LE weakness/myelopathy D/c date 11/1 - however ID says might need to change  -con't PT and OT- don't want pt standing today, but can restart therapy-  2.  Antithrombotics: -DVT/anticoagulation:  Mechanical: Sequential compression devices, below knee Bilateral lower extremities  Lovenox on board             -antiplatelet therapy: N/A 3. Pain: still has stabbing/burning bilateral hip/pelvic pain.  Continue robaxin 1000 mg TID.              --MS contin was added 10/17 in addition to prn oxycodone 10/24- changed Exalgo back to MS contin 30 mg B ID per pt request- will wait on changing to percocet.   10/25- pain is doing better per pt with MS Contin- will con't current regimen and monitor- also c/o more muscle spasms- will add Baclofen 5 mg TID; allergic to Zanaflex- 10/28- changed Oxycodone back to percocet 10/325 mg q6 hours prn per pt request  4. Mood: LCSW to follow for evaluation and support. Team to provide ego support.              -antipsychotic agents: N/a 5. Neuropsych: This patient is capable of making decisions on his own behalf. 6. Skin/Wound Care: Routine pressure relief measures.  7. Fluids/Electrolytes/Nutrition: Monitor I/Os 8. 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 --Antibiotics to continue for 6 weeks? -lumbar wound cultures  without growth.  10/19- ID - will need IV Abx for 8 weeks from 10/11- surgery date- changed Vanc to IV Dapto and con't Rifampin 10/24- CRP up slightly and ESR up 10 points as well- will recheck next Monday and if still elevating, will call ID. 10/26- will check in AM- since leaving next Tuesday  10/27- ESR 66- up; CRP 12.4- up; and WBC 10.8- up- also has worsening weakness- called ID and ordered MRI of lumbar spine with and without contrast and called Dr EEllene Route will see what results are. 10/28- MRI looks stable per [facility]- spoke with ID- will change back to Vanc and follow closely.   9. Low  grade fevers: Resolved  10/27- having again- will monitor- as per #8 10. T2DM with peripheral neuropathy/ Hgb A1c-8.4  (was 10.2  01/2021). Will monitor BS ac/hs  --ON insulin glargine  8 units and metformin daily.             --Use SSI for elevated BS/tighter control to help promote wound healing.   10/28- Bgs looking good- con't regimen 11. Episodic Hypotension: Has resolved. Continue to monitor BP TID.  12. Hyponatremia: Monitor as continue to vary from 130-133 13. Acute blood loss anemia: Monitor H/H for trend/recovery.  Hemoglobin 11.5 on 10/18 14. Leucocytosis: Monitor for fevers and other signs of worsening of infection  WBCs 11.7 on 10/18  10/24- WBC 9.8- con't to monitor  10/27- WBC 10.8- per #8             --monitor wounds for healing.  15. Low protein stores:Continue prosource BID. Ensure max once daily.              --Continue vitamins/Zinc also.  33. Dispo   10/26- addressed pt's behavior with other staff members- I need him to be more polite  10/27- explained to pt will likely need to stay longer- and apologized, but explained this could be serious and needs to be addressed.    10/28- pt might need to stay longer, per ID- will monitor  I spent a total of 38 minutes on total care today- >50% coordination of care- speaking with ID x2 and nursing as well as prolonged d/w pt.    LOS: 11  days A FACE TO FACE EVALUATION WAS PERFORMED  Shaft Corigliano 06/22/2021, 12:50 PM

## 2021-06-23 LAB — GLUCOSE, CAPILLARY
Glucose-Capillary: 108 mg/dL — ABNORMAL HIGH (ref 70–99)
Glucose-Capillary: 141 mg/dL — ABNORMAL HIGH (ref 70–99)
Glucose-Capillary: 95 mg/dL (ref 70–99)
Glucose-Capillary: 92 mg/dL (ref 70–99)

## 2021-06-23 NOTE — Progress Notes (Signed)
Occupational Therapy Session Note  Patient Details  Name: John Ware MRN: [id number] Date of Birth: 11/22/1965  Today's Date: 06/24/2021 OT Individual Time: 1020-1100 OT Individual Time Calculation (min): 40 min    Short Term Goals: Week 2:  OT Short Term Goal 1 (Week 2): STGs = LTGs 2/2 LOS  Skilled Therapeutic Interventions/Progress Updates:    Per Dr Berline Chough, ok to work on standing at this time  Pt greeted in bed, requesting to use the restroom. Sit<stand from elevated bed completed with Min A and OT stabilizing the Lt knee. Pt willing to attempt stand pivot. Min A for stand pivot<w/c using RW. Mod A for sit<stand using the grab bar and then pt pivoted to the elevated toilet. Pt with B+B void, able to complete perihygiene himself using lateral leans. Noted that his clothing was soaked with urine. Per pt, due to spilled urinal. He stated that he wanted to just wait for OT vs ask staff to assist him with hygiene needs. Donned new pants with Max A due to lack of reacher, pt utilizing squat stand for elevating underwear and pants. He then completed hand washing, oral care, and UB bathing while sitting at the sink with setup assist. Setup for UB dressing as well. He remained sitting up at close of session, all needs within reach and safety belt fastened.   Therapy Documentation Precautions:  Precautions Precautions: Fall, Back Precaution Comments: recalled 3/3 precautions Restrictions Weight Bearing Restrictions: No  Pain: pt premedicated for pain  Pain Assessment Pain Scale: 0-10 Pain Score: 2  Pain Type: Chronic pain Pain Location: Back Pain Orientation: Mid;Lower Pain Descriptors / Indicators: Aching Pain Intervention(s): Medication (See eMAR) ADL: ADL Eating: Set up Grooming: Setup Upper Body Bathing: Minimal assistance Lower Body Bathing: Dependent Upper Body Dressing: Minimal assistance Lower Body Dressing: Dependent (simulated) Toileting: Maximal assistance  (simulated) Toilet Transfer: Moderate assistance Toilet Transfer Method: Stand pivot  Therapy/Group: Individual Therapy  Braelin Costlow A Schneider Warchol 06/24/2021, 12:52 PM

## 2021-06-24 LAB — GLUCOSE, CAPILLARY
Glucose-Capillary: 108 mg/dL — ABNORMAL HIGH (ref 70–99)
Glucose-Capillary: 146 mg/dL — ABNORMAL HIGH (ref 70–99)
Glucose-Capillary: 82 mg/dL (ref 70–99)
Glucose-Capillary: 115 mg/dL — ABNORMAL HIGH (ref 70–99)

## 2021-06-24 NOTE — Progress Notes (Signed)
Physical Therapy Session Note  Patient Details  Name: John Ware MRN: [id number] Date of Birth: 1965/12/29  Today's Date: 06/24/2021 PT Individual Time: 1500-1510 PT Individual Time Calculation (min): 10 min  PT Amount of Missed Time (min): 50 Minutes PT Missed Treatment Reason: Pain;Patient unwilling to participate  Short Term Goals: Week 2:  PT Short Term Goal 1 (Week 2): =LTGs d/t ELOS  Skilled Therapeutic Interventions/Progress Updates:    Pt received seated in w/c in room, declines to participate in therapy session this PM due to pain. Pt reports ongoing nausea/vomiting and that he feels that he "pulled every muscle in my body". Pt reports nursing aware and he has received medication for pain and for vomiting. Pt encouraged to perform standing this date either with RW or in // bars in gym. Pt reports he is unable to at this time due to pain and weakness. Encouraged pt to perform standing at least in // bars as he has not been able to practice this for a few days and needs to be able to stand to perform mobility upon d/c home. Pt again declines due to not feeling well. Pt's wife present and provides home measurement sheet. Reviewed home measurement sheet including stairs, bathroom setup, and bed height. Pt encouraged to continue to practice standing in order to safely d/c home. Pt left seated in w/c in room with needs in reach, wife present. Pt missed 50 min of scheduled therapy session due to refusal.  Therapy Documentation Precautions:  Precautions Precautions: Fall, Back Precaution Comments: recalled 3/3 precautions Restrictions Weight Bearing Restrictions: No    Therapy/Group: Individual Therapy   Peter Congo, PT, DPT, CSRS  06/24/2021, 3:12 PM

## 2021-06-24 NOTE — Progress Notes (Signed)
PROGRESS NOTE   Subjective/Complaints:  Weakness is a little better, but still not back to where it was.  Therapy asked if can stand him- I explained they can try, but not sure his strength is enough for walking.  Really wants to leave Tuesday, but have to check with ID and make sure labs doing better.     ROS:    Pt denies SOB, abd pain, CP, N/V/C/D, and vision changes   Objective:   No results found. No results for input(s): WBC, HGB, HCT, PLT in the last 72 hours.   No results for input(s): NA, K, CL, CO2, GLUCOSE, BUN, CREATININE, CALCIUM in the last 72 hours.    Intake/Output Summary (Last 24 hours) at 06/24/2021 1642 Last data filed at 06/24/2021 1616 Gross per 24 hour  Intake 904 ml  Output 1875 ml  Net -971 ml     Pressure Injury 06/11/21 Buttocks Left Stage 1 -  Intact skin with non-blanchable redness of a localized area usually over a bony prominence. (Active)  06/11/21 1617  Location: Buttocks  Location Orientation: Left  Staging: Stage 1 -  Intact skin with non-blanchable redness of a localized area usually over a bony prominence.  Wound Description (Comments):   Present on Admission: Yes     Pressure Injury 06/11/21 Buttocks Right Stage 1 -  Intact skin with non-blanchable redness of a localized area usually over a bony prominence. (Active)  06/11/21 1618  Location: Buttocks  Location Orientation: Right  Staging: Stage 1 -  Intact skin with non-blanchable redness of a localized area usually over a bony prominence.  Wound Description (Comments):   Present on Admission: Yes    Physical Exam: Vital Signs Blood pressure 111/71, pulse 60, temperature 98 F (36.7 C), temperature source Oral, resp. rate 15, height _0  (1.778 m), weight 76.3 kg, SpO2 98 %.          General: awake, alert, appropriate, sitting up in bedside w/c; NAD HENT: conjugate gaze; oropharynx moist CV: regular rate;  no JVD Pulmonary: CTA B/L; no W/R/R- good air movement GI: soft, NT, ND, (+)BS Psychiatric: appropriate- brighter affect Neurological: Ox3  RLE_ HF 4-/5; KE 4-/5, DF 4-5 and PF 4/5 LLE- HF 2-/5 at best; KE 2-5 at best- slightly better; DF 3-/5 and PF 3/5  Skin: Warm and dry.  Intact. Back incision scabbed over- looks great- PICC line looks ok,  Musc: No edema in extremities.  No tenderness in extremities. Neuro: Alert Bilateral upper extremities: 5/5 proximal distal Left lower extremity: Hip flexion, knee extension 0/5, ankle dorsiflexion 4-/5  Assessment/Plan: 1. Functional deficits which require 3+ hours per day of interdisciplinary therapy in a comprehensive inpatient rehab setting. Physiatrist is providing close team supervision and 24 hour management of active medical problems listed below. Physiatrist and rehab team continue to assess barriers to discharge/monitor patient progress toward functional and medical goals  Care Tool:  Bathing    Body parts bathed by patient: Right arm, Left arm, Chest, Abdomen, Right upper leg, Left upper leg, Face, Front perineal area, Buttocks   Body parts bathed by helper: Right lower leg, Left lower leg     Bathing assist Assist  Level: Minimal Assistance - Patient > 75%     Upper Body Dressing/Undressing Upper body dressing   What is the patient wearing?: Pull over shirt    Upper body assist Assist Level: Set up assist    Lower Body Dressing/Undressing Lower body dressing      What is the patient wearing?: Pants, Underwear/pull up     Lower body assist Assist for lower body dressing: Minimal Assistance - Patient > 75%     Toileting Toileting    Toileting assist Assist for toileting: Minimal Assistance - Patient > 75%     Transfers Chair/bed transfer  Transfers assist     Chair/bed transfer assist level: Moderate Assistance - Patient 50 - 74%     Locomotion Ambulation   Ambulation assist   Ambulation activity  did not occur: Safety/medical concerns  Assist level: Minimal Assistance - Patient > 75% Assistive device: Walker-rolling Max distance: 50   Walk 10 feet activity   Assist  Walk 10 feet activity did not occur: Safety/medical concerns  Assist level: Minimal Assistance - Patient > 75% Assistive device: Walker-rolling   Walk 50 feet activity   Assist Walk 50 feet with 2 turns activity did not occur: Safety/medical concerns  Assist level: Minimal Assistance - Patient > 75% Assistive device: Walker-rolling    Walk 150 feet activity   Assist Walk 150 feet activity did not occur: Safety/medical concerns         Walk 10 feet on uneven surface  activity   Assist Walk 10 feet on uneven surfaces activity did not occur: Safety/medical concerns         Wheelchair     Assist Is the patient using a wheelchair?: Yes Type of Wheelchair: Manual Wheelchair activity did not occur: Safety/medical concerns  Wheelchair assist level: Supervision/Verbal cueing Max wheelchair distance: 116f    Wheelchair 50 feet with 2 turns activity    Assist    Wheelchair 50 feet with 2 turns activity did not occur: Safety/medical concerns   Assist Level: Supervision/Verbal cueing   Wheelchair 150 feet activity     Assist  Wheelchair 150 feet activity did not occur: Safety/medical concerns   Assist Level: Supervision/Verbal cueing   Blood pressure 111/71, pulse 60, temperature 98 F (36.7 C), temperature source Oral, resp. rate 15, height _0  (1.778 m), weight 76.3 kg, SpO2 98 %.  Medical Problem List and Plan: 1.  Decline in mobility and ADLs secondary to LLE radiculopathy resulting in septic lumbar discitis with LE weakness/myelopathy D/c date 11/1 - however ID says might need to change  -con't PT and OT- said can try and stand pt if he can tolerate- CIR- need to figure out if ID will allow him to leave Tuesday? They told me he might not be able to.  2.   Antithrombotics: -DVT/anticoagulation:  Mechanical: Sequential compression devices, below knee Bilateral lower extremities  Lovenox on board             -antiplatelet therapy: N/A 3. Pain: still has stabbing/burning bilateral hip/pelvic pain.  Continue robaxin 1000 mg TID.              --MS contin was added 10/17 in addition to prn oxycodone 10/24- changed Exalgo back to MS contin 30 mg B ID per pt request- will wait on changing to percocet.   10/25- pain is doing better per pt with MS Contin- will con't current regimen and monitor- also c/o more muscle spasms- will add Baclofen 5 mg TID; allergic  to Zanaflex- 10/30- pain is stable- con't regimen-   4. Mood: LCSW to follow for evaluation and support. Team to provide ego support.              -antipsychotic agents: N/a 5. Neuropsych: This patient is capable of making decisions on his own behalf. 6. Skin/Wound Care: Routine pressure relief measures.  7. Fluids/Electrolytes/Nutrition: Monitor I/Os 8. 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 --Antibiotics to continue for 6 weeks? -lumbar wound cultures without growth.  10/19- ID - will need IV Abx for 8 weeks from 10/11- surgery date- changed Vanc to IV Dapto and con't Rifampin 10/24- CRP up slightly and ESR up 10 points as well- will recheck next Monday and if still elevating, will call ID. 10/26- will check in AM- since leaving next Tuesday  10/27- ESR 66- up; CRP 12.4- up; and WBC 10.8- up- also has worsening weakness- called ID and ordered MRI of lumbar spine with and without contrast and called Dr Ellene Route- will see what results are. 10/28- MRI looks stable per [facility]- spoke with ID- will change back to Vanc and follow closely.   =10/30- [facility] doesn't feel there's any surgical intervention- con't IV Vanc and recheck labs Monday 9. Low grade fevers: Resolved  10/27- having again- will monitor- as per #8 10. T2DM with peripheral neuropathy/ Hgb A1c-8.4   (was 10.2  01/2021). Will monitor BS ac/hs  --ON insulin glargine  8 units and metformin daily.             --Use SSI for elevated BS/tighter control to help promote wound healing.   10/30- BG's look great- con't regimen 11. Episodic Hypotension: Has resolved. Continue to monitor BP TID.  12. Hyponatremia: Monitor as continue to vary from 130-133 13. Acute blood loss anemia: Monitor H/H for trend/recovery.  Hemoglobin 11.5 on 10/18 14. Leucocytosis: Monitor for fevers and other signs of worsening of infection  WBCs 11.7 on 10/18  10/24- WBC 9.8- con't to monitor  10/27- WBC 10.8- per #8             --monitor wounds for healing.  15. Low protein stores:Continue prosource BID. Ensure max once daily.              --Continue vitamins/Zinc also.  53. Dispo   10/26- addressed pt's behavior with other staff members- I need him to be more polite  10/27- explained to pt will likely need to stay longer- and apologized, but explained this could be serious and needs to be addressed.    10/30- need to check with ID and see if OK to d/c pt Tuesday on IV ABX. They mentioned that could be an issue.    LOS: 13 days A FACE TO FACE EVALUATION WAS PERFORMED  Abem Shaddix 06/24/2021, 4:42 PM

## 2021-06-24 NOTE — Progress Notes (Signed)
Occupational Therapy Session Note  Patient Details  Name: John Ware MRN: [id number] Date of Birth: Oct 13, 1965  Today's Date: 06/25/2021 OT Individual Time: 1350-1421 OT Individual Time Calculation (min): 31 min  44 minutes missed  Skilled Therapeutic Interventions/Progress Updates:    Pt greeted in the bed, initially refusing tx but agreeable to EOB activity. Setup for supine<sit and we reviewed AE use for maximizing his independence with LB dressing. Pt able to use the reacher, sock aide, and shoe funnel with setup assistance to doff/don gripper socks and don his sneakers. Pt already equipped with elastic shoelaces. Education provided on recommendation for pt to have a drop arm BSC at home for toilet transfers. Pt reports that his bathroom is not w/c accessible, he plans to sit on his rollator and roll towards the toilet. Educated pt that this method is not safe, advised using the drop arm BSC and using the mini push up method for clothing mgt, pt able to lean laterally to perform hygiene on his own (has practiced this with OT). He states that his wife can assist him as needed and pt has the capacities to direct his care. Strongly encouraged pt to consider an extension in his CIR stay to further increase his independence due to recent functional decline. Pt at this time is refusing to stay longer due to d/c date being his birthday. Pt ultimately refused a majority of safety education provided during session. He remained in care of RN for receiving his pain medicine. Tx time missed due to pt refusal/pain, n/v before OT arrived and not feeling well.   Therapy Documentation Precautions:  Precautions Precautions: Fall, Back Precaution Comments: recalled 3/3 precautions Restrictions Weight Bearing Restrictions: No General: General OT Amount of Missed Time: 44 Minutes PT Missed Treatment Reason: Pain;Patient unwilling to participate Vital Signs: Therapy Vitals Pulse Rate: 65 Resp:  18 BP: 103/66 Patient Position (if appropriate): Lying Oxygen Therapy SpO2: 99 % O2 Device: Room Air Pain:   ADL: ADL Eating: Set up Grooming: Setup Upper Body Bathing: Minimal assistance Lower Body Bathing: Dependent Upper Body Dressing: Minimal assistance Lower Body Dressing: Dependent (simulated) Toileting: Maximal assistance (simulated) Toilet Transfer: Moderate assistance Toilet Transfer Method: Stand pivot   Therapy/Group: Individual Therapy  Kendel Pesnell A Jode Lippe 06/25/2021, 3:38 PM

## 2021-06-25 ENCOUNTER — Other Ambulatory Visit (HOSPITAL_COMMUNITY): Payer: Self-pay

## 2021-06-25 LAB — BASIC METABOLIC PANEL
Anion gap: 6 (ref 5–15)
BUN: 12 mg/dL (ref 6–20)
CO2: 28 mmol/L (ref 22–32)
Calcium: 9.3 mg/dL (ref 8.9–10.3)
Chloride: 100 mmol/L (ref 98–111)
Creatinine, Ser: 0.55 mg/dL — ABNORMAL LOW (ref 0.61–1.24)
GFR, Estimated: >60 mL/min (ref >60)
Glucose, Bld: 86 mg/dL (ref 70–99)
Potassium: 4.1 mmol/L (ref 3.5–5.1)
Sodium: 134 mmol/L — ABNORMAL LOW (ref 135–145)

## 2021-06-25 LAB — CBC
HCT: 32.9 % — ABNORMAL LOW (ref 39.0–52.0)
Hemoglobin: 10.8 g/dL — ABNORMAL LOW (ref 13.0–17.0)
MCH: 26.8 pg (ref 26.0–34.0)
MCHC: 32.8 g/dL (ref 30.0–36.0)
MCV: 81.6 fL (ref 80.0–100.0)
Platelets: 451 10*3/uL — ABNORMAL HIGH (ref 150–400)
RBC: 4.03 MIL/uL — ABNORMAL LOW (ref 4.22–5.81)
RDW: 15.7 % — ABNORMAL HIGH (ref 11.5–15.5)
WBC: 9.6 10*3/uL (ref 4.0–10.5)
nRBC: 0 % (ref 0.0–0.2)

## 2021-06-25 LAB — GLUCOSE, CAPILLARY
Glucose-Capillary: 81 mg/dL (ref 70–99)
Glucose-Capillary: 115 mg/dL — ABNORMAL HIGH (ref 70–99)
Glucose-Capillary: 86 mg/dL (ref 70–99)

## 2021-06-25 LAB — SEDIMENTATION RATE: Sed Rate: 70 mm/hr — ABNORMAL HIGH (ref 0–16)

## 2021-06-25 LAB — C-REACTIVE PROTEIN: CRP: 9.7 mg/dL — ABNORMAL HIGH (ref <1.0)

## 2021-06-25 MED ORDER — RIFAMPIN 300 MG PO CAPS
300.0000 mg | ORAL_CAPSULE | Freq: Two times a day (BID) | ORAL | 1 refills | Status: DC
  Filled 2021-06-25: qty 60, 30d supply, fill #0

## 2021-06-25 MED ORDER — RIFAMPIN 300 MG PO CAPS: 300.0000 mg | ORAL_CAPSULE | Freq: Two times a day (BID) | ORAL | 0 refills | Status: DC

## 2021-06-25 MED ORDER — VANCOMYCIN IV (FOR PTA / DISCHARGE USE ONLY): 1250.0000 mg | Freq: Two times a day (BID) | INTRAVENOUS | 0 refills | Status: DC

## 2021-06-25 NOTE — Progress Notes (Signed)
Inpatient Rehabilitation Care Coordinator Discharge Note   Patient Details  Name: John Ware MRN: [id number] Date of Birth: Sep 16, 1965   Discharge location: HOME WITH WIFE ASSISTING HIM  Length of Stay:  15 DAYS  Discharge activity level: SUPERVISION-MIN LEVEL-BATHING AND DRESSING  Home/community participation: ACTIVE  Patient response IT:GPQDIY Literacy - How often do you need to have someone help you when you read instructions, pamphlets, or other written material from your doctor or pharmacy?: Rarely  Patient response ME:BRAXEN Isolation - How often do you feel lonely or isolated from those around you?: Never  Services provided included: MD, RD, PT, OT, RN, CM, Pharmacy, Neuropsych, SW  Financial Services:  Field seismologist Utilized: Production designer, theatre/television/film  Choices offered to/list presented to: PT AND WIFE  Follow-up services arranged:  Home Health, Patient/Family has no preference for HH/DME agencies Home Health Agency: [facility]-PT & RN . [facility]        Patient response to transportation need: Is the patient able to respond to transportation needs?: Yes In the past 12 months, has lack of transportation kept you from medical appointments or from getting medications?: No In the past 12 months, has lack of transportation kept you from meetings, work, or from getting things needed for daily living?: No    Comments (or additional information):WIFE WAS IN FOR EDUCATION AND BOTH COMFORTABLE WITH CARE AND LEVEL AT DISCHARGE. WIFE HAS DONE IV ANTIBIOTICS AND COMFORTABLE WITH THIS.   Patient/Family verbalized understanding of follow-up arrangements:  Yes  Individual responsible for coordination of the follow-up plan: [name]-WIFE [phone]  Confirmed correct DME delivered: Lucy Chris 06/25/2021    Lucy Chris

## 2021-06-25 NOTE — Progress Notes (Signed)
Physical Therapy Discharge Summary  Patient Details  Name: John Ware MRN: [id number] Date of Birth: 08/16/1966     Patient has met 1 of 5 long term goals due to improved activity tolerance and ability to compensate for deficits.  Patient to discharge at a wheelchair level [name].   Patient's care partner is independent to provide the necessary physical assistance at discharge. Pt had been ambulating up to 50 ft, before experiencing increasing weakness, and has not returned to gait. Pt performs squat pivot transfers with min to mod A, stands with as little as min A in //bars. W/John mobility over 300 ft, including outdoor environments. No family education performed at this time. Providers have discussed delaying d/John to improve independence, but pt prefers to d/John at current level.   Reasons goals not met: Pt did not meet standing and gait goals d/t decline in function and limited participation in therapy.   Recommendation:  Patient will benefit from ongoing skilled PT services in home health setting to continue to advance safe functional mobility, address ongoing impairments in LE strength, proprioception, functional mobility, and minimize fall risk.  Equipment: W/John, RW  Reasons for discharge: discharge from hospital  Patient/family agrees with progress made and goals achieved: Yes  PT Discharge Precautions/Restrictions Precautions Precautions: Fall;Back Precaution Comments: recalled 3/3 precautions Restrictions Weight Bearing Restrictions: No Vital Signs Therapy Vitals Pulse Rate: 65 Resp: 18 BP: 103/66 Patient Position (if appropriate): Lying Oxygen Therapy SpO2: 99 % O2 Device: Room Air Pain   Pain Interference Pain Interference Pain Effect on Sleep: 4. Almost constantly Pain Interference with Therapy Activities: 4. Almost constantly Pain Interference with Day-to-Day Activities: 2. Occasionally Vision/Perception  Perception Perception: Within Functional  Limits Praxis Praxis: Intact  Cognition Overall Cognitive Status: Within Functional Limits for tasks assessed Arousal/Alertness: Awake/alert Orientation Level: Oriented X4 Year: 2022 Month: October Day of Week: Correct Memory: Appears intact Awareness: Appears intact Problem Solving: Appears intact Safety/Judgment: Appears intact Comments: impacted by pain throughout session Sensation Sensation Light Touch: Impaired by gross assessment Proprioception: Impaired by gross assessment Coordination Gross Motor Movements are Fluid and Coordinated: No Coordination and Movement Description: grossly uncoordinated 2/2 pain and weakness Motor  Motor Motor: Abnormal postural alignment and control Motor - Skilled Clinical Observations: limited by pain Motor - Discharge Observations: limited by pain, LE weakness not improving with therapy  Mobility Bed Mobility Rolling Right: Moderate Assistance - Patient 50-74% Right Sidelying to Sit: Supervision/Verbal cueing Supine to Sit: Supervision/Verbal cueing Transfers Transfers: Squat Pivot Transfers Sit to Stand: Moderate Assistance - Patient 50-74% Stand to Sit: Moderate Assistance - Patient 50-74% Stand Pivot Transfers: Other/comment Stand Pivot Comment: Pt not currently performing standing transfers d/t worsening weakness Squat Pivot Transfers: Minimal Assistance - Patient > 75% Transfer (Assistive device): Rolling walker Locomotion  Gait Ambulation: No Gait Distance (Feet): 50 Feet (Pt walked 50 ft earlier in stay, currently not walking d/t weakness) Assistive device: Rolling walker Gait Gait: No Gait velocity: decreased Stairs / Additional Locomotion Stairs: No Wheelchair Mobility Wheelchair Mobility: Yes Wheelchair Assistance: Chartered loss adjuster: Both upper extremities Wheelchair Parts Management: Supervision/cueing Distance: 150 ft  Trunk/Postural Assessment  Cervical Assessment Cervical  Assessment: Within Functional Limits Thoracic Assessment Thoracic Assessment: Within Functional Limits Lumbar Assessment Lumbar Assessment: Exceptions to [facility] (back precautions) Postural Control Postural Control: Deficits on evaluation  Balance Balance Balance Assessed: Yes Timed Up and Go Test Normal TUG (seconds):  (Unable to repeat TUG, pt not ambulating at this time) Dynamic Sitting Balance Dynamic Sitting -  Balance Support: During functional activity Dynamic Sitting - Level of Assistance: 5: Stand by assistance Static Standing Balance Static Standing - Balance Support: During functional activity;Bilateral upper extremity supported Static Standing - Level of Assistance: 4: Min assist Extremity Assessment      RLE Assessment RLE Assessment: Exceptions to [facility] General Strength Comments: Grossly 4-/5, limited by pain LLE Assessment LLE Assessment: Exceptions to [facility] LLE Strength Left Hip Flexion: 2/5 Left Knee Flexion: 3+/5 Left Knee Extension: 3/5 Left Ankle Dorsiflexion: 3/5 Left Ankle Plantar Flexion: 3+/5    John Ware John Ware 06/25/2021, 4:37 PM

## 2021-06-25 NOTE — Progress Notes (Signed)
Inpatient Rehabilitation Discharge Medication Review by a Pharmacist  A complete drug regimen review was completed for this patient to identify any potential clinically significant medication issues.  High Risk Drug Classes Is patient taking? Indication by Medication  Antipsychotic No   Anticoagulant No   Antibiotic Yes Vancomycin / rifampin for osteo, ends 07/31/21  Opioid Yes Percocet for pain  Antiplatelet No   Hypoglycemics/insulin Yes Semglee, metformin for DM  Vasoactive Medication No   Chemotherapy No   Other No      Type of Medication Issue Identified Description of Issue Recommendation(s)  Drug Interaction(s) (clinically significant)     Duplicate Therapy     Allergy     No Medication Administration End Date     Incorrect Dose     Additional Drug Therapy Needed     Significant med changes from prior encounter (inform family/care partners about these prior to discharge).    Other       Clinically significant medication issues were identified that warrant physician communication and completion of prescribed/recommended actions by midnight of the next day:  No  Pharmacist comments:   Time spent performing this drug regimen review (minutes):  20 minutes   Elwin Sleight 06/25/2021 8:23 AM

## 2021-06-25 NOTE — Progress Notes (Signed)
PROGRESS NOTE   Subjective/Complaints:  Still doesn't feel that leg strength (esp LLE) has improved any further. Pam Love was able to connect with ID about plan today. Pt still wants to go home tomorrow  ROS: Patient denies fever, rash, sore throat, blurred vision, nausea, vomiting, diarrhea, cough, shortness of breath or chest pain,   headache, or mood change.    Objective:   No results found. Recent Labs    06/25/21 0313  WBC 9.6  HGB 10.8*  HCT 32.9*  PLT 451*     Recent Labs    06/25/21 0313  NA 134*  K 4.1  CL 100  CO2 28  GLUCOSE 86  BUN 12  CREATININE 0.55*  CALCIUM 9.3      Intake/Output Summary (Last 24 hours) at 06/25/2021 1257 Last data filed at 06/25/2021 0900 Gross per 24 hour  Intake 607 ml  Output 1300 ml  Net -693 ml     Pressure Injury 06/11/21 Buttocks Left Stage 1 -  Intact skin with non-blanchable redness of a localized area usually over a bony prominence. (Active)  06/11/21 1617  Location: Buttocks  Location Orientation: Left  Staging: Stage 1 -  Intact skin with non-blanchable redness of a localized area usually over a bony prominence.  Wound Description (Comments):   Present on Admission: Yes     Pressure Injury 06/11/21 Buttocks Right Stage 1 -  Intact skin with non-blanchable redness of a localized area usually over a bony prominence. (Active)  06/11/21 1618  Location: Buttocks  Location Orientation: Right  Staging: Stage 1 -  Intact skin with non-blanchable redness of a localized area usually over a bony prominence.  Wound Description (Comments):   Present on Admission: Yes    Physical Exam: Vital Signs Blood pressure 96/65, pulse 61, temperature 98 F (36.7 C), temperature source Oral, resp. rate 16, height '5\' 10"'  (1.778 m), weight 76.3 kg, SpO2 96 %.   Constitutional: No distress . Vital signs reviewed. HEENT: NCAT, EOMI, oral membranes moist Neck:  supple Cardiovascular: RRR without murmur. No JVD    Respiratory/Chest: CTA Bilaterally without wheezes or rales. Normal effort    GI/Abdomen: BS +, non-tender, non-distended Ext: no clubbing, cyanosis, or edema Psych: pleasant and cooperative  Neuro: Alert and oriented x 3. Normal insight and awareness. Intact Memory. Normal language and speech. Cranial nerve exam unremarkable  RLE_ HF 4-/5; KE 4-/5, DF 4-5 and PF 4/5 LLE- HF 2-/5 at best; KE 2- to 2/5, ADF 3-/5 and PF 3/5--appears fairly comparable to past exams  Skin: Warm and dry.  Intact. Back incision scabbed. PICC line looks ok,  Musc: No edema in extremities.  No tenderness in extremities.    Assessment/Plan: 1. Functional deficits which require 3+ hours per day of interdisciplinary therapy in a comprehensive inpatient rehab setting. Physiatrist is providing close team supervision and 24 hour management of active medical problems listed below. Physiatrist and rehab team continue to assess barriers to discharge/monitor patient progress toward functional and medical goals  Care Tool:  Bathing    Body parts bathed by patient: Right arm, Left arm, Chest, Abdomen, Right upper leg, Left upper leg, Face, Front perineal area, Buttocks  Body parts bathed by helper: Right lower leg, Left lower leg     Bathing assist Assist Level: Minimal Assistance - Patient > 75%     Upper Body Dressing/Undressing Upper body dressing   What is the patient wearing?: Pull over shirt    Upper body assist Assist Level: Set up assist    Lower Body Dressing/Undressing Lower body dressing      What is the patient wearing?: Pants, Underwear/pull up     Lower body assist Assist for lower body dressing: Minimal Assistance - Patient > 75% (w/ reacher)     Toileting Toileting    Toileting assist Assist for toileting: Minimal Assistance - Patient > 75%     Transfers Chair/bed transfer  Transfers assist     Chair/bed transfer assist  level: Moderate Assistance - Patient 50 - 74%     Locomotion Ambulation   Ambulation assist   Ambulation activity did not occur: Safety/medical concerns  Assist level: Minimal Assistance - Patient > 75% Assistive device: Walker-rolling Max distance: 50   Walk 10 feet activity   Assist  Walk 10 feet activity did not occur: Safety/medical concerns  Assist level: Minimal Assistance - Patient > 75% Assistive device: Walker-rolling   Walk 50 feet activity   Assist Walk 50 feet with 2 turns activity did not occur: Safety/medical concerns  Assist level: Minimal Assistance - Patient > 75% Assistive device: Walker-rolling    Walk 150 feet activity   Assist Walk 150 feet activity did not occur: Safety/medical concerns         Walk 10 feet on uneven surface  activity   Assist Walk 10 feet on uneven surfaces activity did not occur: Safety/medical concerns         Wheelchair     Assist Is the patient using a wheelchair?: Yes Type of Wheelchair: Manual Wheelchair activity did not occur: Safety/medical concerns  Wheelchair assist level: Supervision/Verbal cueing Max wheelchair distance: 153f    Wheelchair 50 feet with 2 turns activity    Assist    Wheelchair 50 feet with 2 turns activity did not occur: Safety/medical concerns   Assist Level: Supervision/Verbal cueing   Wheelchair 150 feet activity     Assist  Wheelchair 150 feet activity did not occur: Safety/medical concerns   Assist Level: Supervision/Verbal cueing   Blood pressure 96/65, pulse 61, temperature 98 F (36.7 C), temperature source Oral, resp. rate 16, height '5\' 10"'  (1.778 m), weight 76.3 kg, SpO2 96 %.  Medical Problem List and Plan: 1.  Decline in mobility and ADLs secondary to LLE radiculopathy resulting in septic lumbar discitis with LE weakness/myelopathy D/c date 11/1 - however ID says might need to change  -Continue CIR therapies including PT, OT  -finalize dc  planning for 11/1  2.  Antithrombotics: -DVT/anticoagulation:  Mechanical: Sequential compression devices, below knee Bilateral lower extremities  Lovenox on board             -antiplatelet therapy: N/A 3. Pain: still has stabbing/burning bilateral hip/pelvic pain.  Continue robaxin 1000 mg TID.              --MS contin was added 10/17 in addition to prn oxycodone 10/24- changed Exalgo back to MS contin 30 mg B ID per pt request- will wait on changing to percocet.   10/25- pain is doing better per pt with MS Contin- will con't current regimen and monitor- also c/o more muscle spasms- will add Baclofen 5 mg TID; allergic to Zanaflex- 10/30-31>- pain  is stable- con't regimen-   4. Mood: LCSW to follow for evaluation and support. Team to provide ego support.              -antipsychotic agents: N/a 5. Neuropsych: This patient is capable of making decisions on his own behalf. 6. Skin/Wound Care: Routine pressure relief measures.  7. Fluids/Electrolytes/Nutrition: Monitor I/Os 8. 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 --Antibiotics to continue for 6 weeks? -lumbar wound cultures without growth.  10/19- ID - will need IV Abx for 8 weeks from 10/11- surgery date- changed Vanc to IV Dapto and con't Rifampin 10/24- CRP up slightly and ESR up 10 points as well- will recheck next Monday and if still elevating, will call ID. 10/26- will check in AM- since leaving next Tuesday  10/27- ESR 66- up; CRP 12.4- up; and WBC 10.8- up- also has worsening weakness- called ID and ordered MRI of lumbar spine with and without contrast and called Dr Ellene Route- will see what results are. 10/28- MRI looks stable per [facility]- spoke with ID- will change back to Vanc and follow closely.   -10/31 ESR 70, WBC's 9.6 -ID recs vanc/rifampin thru 07/31/21  -outpt ID f/u 11/22 with Dr. Yvette Rack  -outpt NS f/u with [facility] 9. Low grade fevers: Resolved  10/27- having again- will monitor-  as per #8 10. T2DM with peripheral neuropathy/ Hgb A1c-8.4  (was 10.2  01/2021). Will monitor BS ac/hs  --ON insulin glargine  8 units and metformin daily.             --Use SSI for elevated BS/tighter control to help promote wound healing.   10/31- CBG's controlled 11. Episodic Hypotension: Has resolved. Continue to monitor BP TID.  12. Hyponatremia: Monitor as continue to vary from 130-133 13. Acute blood loss anemia: Monitor H/H for trend/recovery.  Hemoglobin 10.8 10/31 14. Leucocytosis: Monitor for fevers and other signs of worsening of infection  See #8                15. Low protein stores:Continue prosource BID. Ensure max once daily.              --Continue vitamins/Zinc also.  21. Dispo   10/26- addressed pt's behavior with other staff members- I need him to be more polite  10/27- explained to pt will likely need to stay longer- and apologized, but explained this could be serious and needs to be addressed.    10/31 dc home 11/1  LOS: 14 days A FACE TO [place] 06/25/2021, 12:57 PM

## 2021-06-25 NOTE — Progress Notes (Signed)
Physical Therapy Session Note  Patient Details  Name: John Ware MRN: [id number] Date of Birth: 1966/03/08  Today's Date: 06/25/2021 PT Individual Time: 4818-5909 PT Individual Time Calculation (min): 35 min   Short Term Goals: Week 1:  PT Short Term Goal 1 (Week 1): Pt will initate gait training PT Short Term Goal 1 - Progress (Week 1): Met PT Short Term Goal 2 (Week 1): Pt will self-propel w/c x 100 ft PT Short Term Goal 2 - Progress (Week 1): Met PT Short Term Goal 3 (Week 1): Pt will perform STS with min A PT Short Term Goal 3 - Progress (Week 1): Partly met  Skilled Therapeutic Interventions/Progress Updates:    Pt seated in w/c on arrival and reporting 4/10 pain, premedicated. Discussed pt's condition over weekend and that he is cleared to attempt standing activity. Pt propelled w/c with BUE for endurance and functional mobility. Pt performed Sit to stand in // bars x 3, with mod A fading to min A for power up, blocked knees for safety. Pt demoes large anterior trunk lean, stating he cannot come upright d/t pain. Once standing, pt has no incidence of knee buckling. Pt cued to touch hand to CL shoulder to decr UE reliance and incr confidence. Pt required extended seated rest break after each attempt. After 1 attempt, pt stated he hurt his shoulder and became frustrated, but made no mention of it again after resting. At this time, pt politely declined to continue session. Pt returned to room and remained seated in w/c, was left with all needs in reach and alarm active.    Therapy Documentation Precautions:  Precautions Precautions: Fall, Back Precaution Comments: recalled 3/3 precautions Restrictions Weight Bearing Restrictions: No   Therapy/Group: Individual Therapy  Mickel Fuchs 06/25/2021, 12:50 PM

## 2021-06-25 NOTE — Progress Notes (Signed)
Occupational Therapy Discharge Summary  Patient Details  Name: John Ware MRN: [id number] Date of Birth: 15-Apr-1966    Patient has met 6 of 10 long term goals due to improved activity tolerance, improved balance, postural control, improved awareness, and improved coordination.  Patient to discharge at [facility] Assist level.  Patient's care partner is independent to provide the necessary physical assistance at discharge. Pt had shown improvement with OT services walking short distances in room for ADL tasks, performing transfers with CGA with RW, and CGA/Min for LB bathing/dressing, but had recent decline and now performing tasks with Min/Mod A overall. Pain has been significantly limiting for this patient throughout his stay. Spouse [name] has been present for several ADL sessions and pt is able to direct his care. Recommended to the pt to extend stay but declining and wanting to go home.   Reasons goals not met: Pt with recent decline and did not meet several ADL goals including functional transfers.   Recommendation:  Patient will benefit from ongoing skilled OT services in home health setting to continue to advance functional skills in the area of BADL, iADL, and Reduce care partner burden.  Equipment: TTB  Reasons for discharge: treatment goals met and discharge from hospital  Patient/family agrees with progress made and goals achieved: Yes  OT Discharge Precautions/Restrictions  Precautions Precautions: Fall;Back Precaution Comments: recalled 3/3 precautions Restrictions Weight Bearing Restrictions: No Pain Pain Assessment Pain Scale: 0-10 Pain Score: 6  Pain Type: Chronic pain Pain Location: Hip Pain Orientation: Right Pain Descriptors / Indicators: Aching Pain Frequency: Intermittent Pain Onset: On-going Patients Stated Pain Goal: 0 Pain Intervention(s): Medication (See eMAR) ADL ADL Eating: Independent Grooming: Modified independent Upper Body Bathing:  Supervision/safety Lower Body Bathing: Minimal assistance Upper Body Dressing: Supervision/safety Lower Body Dressing: Minimal assistance Toileting: Contact guard Toilet Transfer: Minimal assistance Toilet Transfer Method: Stand pivot Vision Baseline Vision/History: 1 Wears glasses Patient Visual Report: No change from baseline Perception  Perception: Within Functional Limits Praxis Praxis: Intact Cognition Overall Cognitive Status: Within Functional Limits for tasks assessed Arousal/Alertness: Awake/alert Orientation Level: Oriented X4 Year: 2022 Month: October Day of Week: Correct Memory: Appears intact Immediate Memory Recall: Sock;Blue;Bed Memory Recall Sock: Without Cue Memory Recall Blue: Without Cue Memory Recall Bed: Without Cue Awareness: Appears intact Problem Solving: Appears intact Safety/Judgment: Appears intact Sensation Sensation Light Touch: Impaired by gross assessment Proprioception: Impaired by gross assessment Coordination Gross Motor Movements are Fluid and Coordinated: No Fine Motor Movements are Fluid and Coordinated: Yes Coordination and Movement Description: grossly uncoordinated 2/2 pain and weakness Motor  Motor Motor: Abnormal postural alignment and control Motor - Skilled Clinical Observations: limited by pain Mobility  Bed Mobility Bed Mobility: Supine to Sit;Right Sidelying to Sit Right Sidelying to Sit: Supervision/Verbal cueing Supine to Sit: Supervision/Verbal cueing Transfers Sit to Stand: Moderate Assistance - Patient 50-74% Stand to Sit: Moderate Assistance - Patient 50-74%  Trunk/Postural Assessment  Cervical Assessment Cervical Assessment: Within Functional Limits Thoracic Assessment Thoracic Assessment: Within Functional Limits Lumbar Assessment Lumbar Assessment: Exceptions to [facility] Postural Control Postural Control: Deficits on evaluation  Balance Balance Balance Assessed: Yes Dynamic Sitting Balance Dynamic Sitting  - Balance Support: During functional activity Dynamic Sitting - Level of Assistance: 5: Stand by assistance Dynamic Sitting - Balance Activities: Reaching for objects Static Standing Balance Static Standing - Balance Support: During functional activity;Bilateral upper extremity supported Static Standing - Level of Assistance: 4: Min assist Dynamic Standing Balance Dynamic Standing - Balance Support: During functional activity;Bilateral upper extremity  supported Dynamic Standing - Level of Assistance: 3: Mod assist;4: Min assist Dynamic Standing - Balance Activities: Forward lean/weight shifting;Lateral lean/weight shifting;Reaching for objects Extremity/Trunk Assessment RUE Assessment RUE Assessment: Exceptions to [facility] Active Range of Motion (AROM) Comments: approx 90* General Strength Comments: limited 2/2 surgical intervention LUE Assessment LUE Assessment: Exceptions to [facility] Active Range of Motion (AROM) Comments: approx 100* General Strength Comments: limited 2/2 surgical intervention   Viona Gilmore 06/26/2021, 12:42 PM

## 2021-06-25 NOTE — Progress Notes (Signed)
Occupational Therapy Session Note  Patient Details  Name: John Ware MRN: [id number] Date of Birth: 05/05/1966  Today's Date: 06/25/2021 OT Individual Time: 5488-3014 OT Individual Time Calculation (min): 54 min    Short Term Goals: Week 1:  OT Short Term Goal 1 (Week 1): Pt will perform stand pivot to BSC/toilet with LRAD Min A OT Short Term Goal 1 - Progress (Week 1): Met OT Short Term Goal 2 (Week 1): Pt will perform LB dress with Mod A and AE PRN OT Short Term Goal 2 - Progress (Week 1): Met OT Short Term Goal 3 (Week 1): Pt will perform sit <> stands in prep for ADL with Min A at [place] Term Goal 3 - Progress (Week 1): Met OT Short Term Goal 4 (Week 1): Pt will increase AROM for shoulder flexion to 90* in B shoulders OT Short Term Goal 4 - Progress (Week 1): Progressing toward goal Week 2:  OT Short Term Goal 1 (Week 2): STGs = LTGs 2/2 LOS   Skilled Therapeutic Interventions/Progress Updates:    Pt greeted at time of session supine in bed resting with 2/10 pain, pt states premedicated. Pt able to recall events from the weekend and that his BLE weakness worsened, but willing to participate this am. Supine > sit Supervision with bed features and extended time. Stand pivot bed > wheelchair > BSC over toilet with Min/Mod A for power up and CGA/Min for balance during transfer all using RW. Clothing management CGA in standing and hygiene with lateral leans. Stand pivot back to wheelchair same as above and set up at sink for UB/LB bathing with MinA overall for LB past knee level. During ADL, discussed with pt for DC planning regarding door widths as he has been mainly at w/c level since set back. Determined home wheelchair will not fit in bathroom but rollator will, but the pt is not able to use at this time 2/2 weakness. Will continue to problem solve. Pt up in wheelchair alarm on call bell in reach.   Therapy Documentation Precautions:  Precautions Precautions: Fall,  Back Precaution Comments: recalled 3/3 precautions Restrictions Weight Bearing Restrictions: No     Therapy/Group: Individual Therapy  Viona Gilmore 06/25/2021, 7:22 AM

## 2021-06-25 NOTE — Progress Notes (Signed)
Discussed patient with Dr. Daiva Eves who recommends continuing Vanc/Rifampin thorough 12/06 per Dr. Orlando Penner recommendations. Patient has follow up with Dr. Manson Passey for 11/22 @ 4 pm  for evaluation prior to completion of antibiotic regimen and is cleared for discharge tomorrow.

## 2021-06-25 NOTE — Progress Notes (Signed)
Physical Therapy Session Note  Patient Details  Name: John Ware MRN: [id number] Date of Birth: 12-Jan-1966  Today's Date: 06/25/2021 PT Individual Time: 1740-8144 PT Individual Time Calculation (min): 40 min   Short Term Goals: Week 2:  PT Short Term Goal 1 (Week 2): =LTGs d/t ELOS  Skilled Therapeutic Interventions/Progress Updates:     Pt received seated in WC and tentatively agrees to therapy, reporting that back hurts from vomiting yesterday and fatigued from earlier session with PT this AM. Pt reports pain in back 5/10 in severity. PT provides gentle mobility and rest breaks to manage pain. WC transport to gym for time management. Pt performs squat pivot transfer to Nustep with CGA and cues for positioning and sequencing. Pt performs Nustep for strength and endurance training as well as gentle rotation of trunk for pain relief. Pt completes at workload of 5 for 8:00 with average steps per minute ~45. Pt takes extended seated rest break prior to performing additional 8:00 with same pace. Squat pivot back to WC with CGA. WC transport back to room. Left seated in WC with alarm intact and all needs within reach.  Therapy Documentation Precautions:  Precautions Precautions: Fall, Back Precaution Comments: recalled 3/3 precautions Restrictions Weight Bearing Restrictions: No   Therapy/Group: Individual Therapy  Beau Fanny, PT, DPT 06/25/2021, 3:55 PM

## 2021-06-25 NOTE — Progress Notes (Signed)
Diagnosis: Vertebral osteomyelitis  Culture Result: MRSA  Allergies  Allergen Reactions   Tizanidine Other (See Comments)    Leg cramps    OPAT Orders Discharge antibiotics:  Vancomycin per pharmacy protocol  Aim for Vancomycin trough 15-20 (unless otherwise indicated)  Rifampin 324m po bid (if he can tolerate this)  Duration:  8 weeks  End Date:  07/31/2021  [facility]:  Labs BI- weekly while on IV antibiotics:  _x_ BMP w GFR X  Vancomycin  Labs  weekly while on IV antibiotics: _x_ CBC with differential _x_ LFTs  _x_ CRP _x_ ESR    _x_ Please pull PIC at completion of IV antibiotics __ Please leave PIC in place until doctor has seen patient or been notified  Fax weekly labs to ([phone] Clinic Follow Up Appt:   MDellia NimsNeugent has an appointment on 07/17/2021 with Dr. MWest [facility]  [address]  [address], which is located to the left of the elevators.  Phone: [phone] Fax: [phone] [email or web address]  He should arrive 30 minutes before his appointment.

## 2021-06-26 ENCOUNTER — Other Ambulatory Visit: Payer: Self-pay | Admitting: Physical Medicine and Rehabilitation

## 2021-06-26 ENCOUNTER — Telehealth: Payer: Self-pay

## 2021-06-26 DIAGNOSIS — A4902 Methicillin resistant Staphylococcus aureus infection, unspecified site: Secondary | ICD-10-CM | POA: Diagnosis not present

## 2021-06-26 DIAGNOSIS — E1142 Type 2 diabetes mellitus with diabetic polyneuropathy: Secondary | ICD-10-CM | POA: Diagnosis not present

## 2021-06-26 DIAGNOSIS — R7881 Bacteremia: Secondary | ICD-10-CM | POA: Diagnosis not present

## 2021-06-26 DIAGNOSIS — M48061 Spinal stenosis, lumbar region without neurogenic claudication: Secondary | ICD-10-CM

## 2021-06-26 DIAGNOSIS — M48062 Spinal stenosis, lumbar region with neurogenic claudication: Secondary | ICD-10-CM

## 2021-06-26 DIAGNOSIS — M4646 Discitis, unspecified, lumbar region: Secondary | ICD-10-CM | POA: Diagnosis not present

## 2021-06-26 LAB — VANCOMYCIN, PEAK: Vancomycin Pk: 41 ug/mL — ABNORMAL HIGH (ref 30–40)

## 2021-06-26 MED ORDER — ASCORBIC ACID 500 MG PO TABS: 500.0000 mg | ORAL_TABLET | Freq: Two times a day (BID) | ORAL | 0 refills | Status: DC

## 2021-06-26 MED ORDER — ACETAMINOPHEN 500 MG PO TABS: 500.0000 mg | ORAL_TABLET | Freq: Four times a day (QID) | ORAL | 0 refills | Status: DC | PRN

## 2021-06-26 MED ORDER — GABAPENTIN 100 MG PO CAPS: 100.0000 mg | ORAL_CAPSULE | Freq: Two times a day (BID) | ORAL | 0 refills | Status: DC

## 2021-06-26 MED ORDER — ZINC SULFATE 220 (50 ZN) MG PO CAPS: 220.0000 mg | ORAL_CAPSULE | Freq: Every day | ORAL | 0 refills | Status: DC

## 2021-06-26 MED ORDER — OXYCODONE-ACETAMINOPHEN 10-325 MG PO TABS: 1.0000 | ORAL_TABLET | Freq: Four times a day (QID) | ORAL | 0 refills | Status: DC | PRN

## 2021-06-26 MED ORDER — METHOCARBAMOL 500 MG PO TABS: 1000.0000 mg | ORAL_TABLET | Freq: Three times a day (TID) | ORAL | 0 refills | Status: DC

## 2021-06-26 MED ORDER — INSULIN GLARGINE-YFGN 100 UNIT/ML ~~LOC~~ SOPN
8.0000 [IU] | PEN_INJECTOR | Freq: Every day | SUBCUTANEOUS | 0 refills | Status: DC
Start: 2021-06-26 — End: 2021-10-17

## 2021-06-26 MED ORDER — BACLOFEN 5 MG PO TABS
5.0000 mg | ORAL_TABLET | Freq: Three times a day (TID) | ORAL | 0 refills | Status: DC
Start: 2021-06-26 — End: 2021-07-26

## 2021-06-26 MED ORDER — MORPHINE SULFATE ER 15 MG PO TBCR: 45.0000 mg | EXTENDED_RELEASE_TABLET | Freq: Two times a day (BID) | ORAL | Status: DC

## 2021-06-26 MED ORDER — ENSURE MAX PROTEIN PO LIQD: 11.0000 [oz_av] | Freq: Every day | ORAL | Status: DC

## 2021-06-26 MED ORDER — ADULT MULTIVITAMIN W/MINERALS CH: 1.0000 | ORAL_TABLET | Freq: Every day | ORAL | Status: DC

## 2021-06-26 MED ORDER — RIFAMPIN 300 MG PO CAPS: 300.0000 mg | ORAL_CAPSULE | Freq: Two times a day (BID) | ORAL | 0 refills | Status: DC

## 2021-06-26 MED ORDER — DICLOFENAC SODIUM 75 MG PO TBEC: 75.0000 mg | DELAYED_RELEASE_TABLET | Freq: Two times a day (BID) | ORAL | 0 refills | Status: DC | PRN

## 2021-06-26 MED ORDER — DICLOFENAC SODIUM 75 MG PO TBEC: 75.0000 mg | DELAYED_RELEASE_TABLET | Freq: Two times a day (BID) | ORAL | 0 refills | Status: DC

## 2021-06-26 MED ORDER — MORPHINE SULFATE ER 15 MG PO TBCR: 45.0000 mg | EXTENDED_RELEASE_TABLET | Freq: Two times a day (BID) | ORAL | 0 refills | Status: DC

## 2021-06-26 MED ORDER — GABAPENTIN 800 MG PO TABS: 800.0000 mg | ORAL_TABLET | Freq: Every day | ORAL | 0 refills | Status: DC

## 2021-06-26 MED ORDER — PEN NEEDLES 32G X 6 MM MISC: 1.0000 "application " | Freq: Every day | 1 refills | Status: DC

## 2021-06-26 MED ORDER — NALOXONE HCL 0.4 MG/ML IJ SOLN: 0.4000 mg | INTRAMUSCULAR | 0 refills | Status: AC | PRN

## 2021-06-26 NOTE — Telephone Encounter (Signed)
John Ware is trying to establish care with a pain clinic. He does not have the $600 for the first visit. Patient wanted to know if you will send in  pain medicine? To cover him few weeks.   Call back ph [phone].

## 2021-06-26 NOTE — Progress Notes (Signed)
[facility]  Date of Admission:  06/11/2021      Total days of antibiotics   Vancomycin 10/28 >> current   Rifampin 10/14 >> current   ASSESSMENT: John Ware is a 55 y.o. 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 He says he is finally starting to feel some relief in symptoms since switching back to vancomycin. He is very happy about going home today. He has been tolerating the rifampin well - mentioned that he vomited once but otherwise has done well.   Medication montoring = needs LFT monitoring on long term rifampin use. Weekly CBC, twice weekly BMP and vanc levels.   Will prepare for outpatient care with Dr. West Bali on 11/22.  OPAT as outlined below.     PLAN: OPAT outlined below.    OPAT ORDERS:  Diagnosis: vertebral infection c/b hardware   Culture Result: MRSA   Allergies  Allergen Reactions   Tizanidine Other (See Comments)    Leg cramps     Discharge antibiotics to be given via PICC line:  Per pharmacy protocol VANCOMYCIN Aim for Vancomycin trough 15-20 or AUC 400-550 (unless otherwise indicated)   Duration: 6 weeks   End Date: Jul 17 2021  [place] Per Protocol with [employer] Use: Home health RN for IV administration and teaching, line care and labs.    Labs weekly while on IV antibiotics: _x_ CBC with differential _x_ BMP TWICE WEEKLY _x_ LFT  _x_ CRP _x_ ESR _x_ Vancomycin trough __ CK  __ Please pull PIC at completion of IV antibiotics _x_ Please leave PIC in place until doctor has seen patient or been notified  Fax weekly labs to [phone]   Clinic Follow Up Appt: Dr. West Bali November 22 @ 400pm   Principal Problem:   Septic discitis of lumbar region Active Problems:   Transient weakness of right lower extremity   Acute blood loss anemia   Diabetic peripheral neuropathy (HCC)   Bacteremia   Postoperative pain   MRSA  infection    vitamin C  500 mg Oral BID   baclofen  5 mg Oral TID   Chlorhexidine Gluconate Cloth  6 each Topical Daily   diclofenac  75 mg Oral BID   enoxaparin (LOVENOX) injection  40 mg Subcutaneous Q24H   gabapentin  100 mg Oral BID   gabapentin  800 mg Oral QHS   insulin aspart  0-5 Units Subcutaneous QHS   insulin aspart  0-9 Units Subcutaneous TID WC   insulin glargine-yfgn  8 Units Subcutaneous Daily   metFORMIN  1,500 mg Oral Q breakfast   methocarbamol  1,000 mg Oral TID   morphine  45 mg Oral Q12H   multivitamin with minerals  1 tablet Oral Daily   Ensure Max Protein  11 oz Oral QHS   rifampin  300 mg Oral Q12H   senna-docusate  2 tablet Oral Daily   zinc sulfate  220 mg Oral Daily    SUBJECTIVE: Doing better since switching back to vancomycin.   Birthday today!  Going home and has a pain management appt later this afternoon.     Review of Systems: Review of Systems  Constitutional:  Negative for chills, fever, malaise/fatigue and weight loss.  HENT:  Negative for sore throat.        No dental problems  Respiratory:  Negative for cough and sputum production.   Cardiovascular:  Negative for chest pain and leg swelling.  Gastrointestinal:  Negative for abdominal pain, diarrhea and vomiting.  Genitourinary:  Negative for dysuria and flank pain.  Musculoskeletal:  Negative for joint pain, myalgias and neck pain.  Skin:  Negative for rash.  Neurological:  Positive for weakness (LLE weakness - improving per his report.). Negative for dizziness, tingling and headaches.  Psychiatric/Behavioral:  Negative for depression and substance abuse. The patient is not nervous/anxious and does not have insomnia.      Allergies  Allergen Reactions   Tizanidine Other (See Comments)    Leg cramps    OBJECTIVE: Vitals:   06/25/21 0328 06/25/21 1422 06/25/21 2007 06/26/21 0411  BP: 96/65 103/66 112/73 115/74  Pulse: 61 65 75 68  Resp: _0 Temp: 98 F (36.7 C)   98.1 F (36.7 C) 98.1 F (36.7 C)  TempSrc: Oral  Oral Oral  SpO2: 96% 99% 98% 97%  Weight:      Height:       Body mass index is 24.14 kg/m.  Physical Exam Constitutional:      Appearance: Normal appearance. He is not ill-appearing.  HENT:     Head: Normocephalic.     Mouth/Throat:     Mouth: Mucous membranes are moist.     Pharynx: Oropharynx is clear.  Eyes:     General: No scleral icterus. Cardiovascular:     Rate and Rhythm: Normal rate and regular rhythm.  Pulmonary:     Effort: Pulmonary effort is normal. No respiratory distress.  Abdominal:     General: There is no distension.  Musculoskeletal:        General: Normal range of motion.     Cervical back: Normal range of motion.  Skin:    Coloration: Skin is not jaundiced or pale.  Neurological:     Mental Status: He is alert and oriented to person, place, and time.  Psychiatric:        Mood and Affect: Mood normal.        Judgment: Judgment normal.  PICC line - clean/dry dressing. Insertion site w/o erythema, tenderness, drainage, cording or distal swelling of affected extremity    Lab Results Lab Results  Component Value Date   WBC 9.6 06/25/2021   HGB 10.8 (L) 06/25/2021   HCT 32.9 (L) 06/25/2021   MCV 81.6 06/25/2021   PLT 451 (H) 06/25/2021    Lab Results  Component Value Date   CREATININE 0.55 (L) 06/25/2021   BUN 12 06/25/2021   NA 134 (L) 06/25/2021   K 4.1 06/25/2021   CL 100 06/25/2021   CO2 28 06/25/2021    Lab Results  Component Value Date   ALT 10 06/12/2021   AST 11 (L) 06/12/2021   ALKPHOS 80 06/12/2021   BILITOT 0.3 06/12/2021     Microbiology: No results found for this or any previous visit (from the past 240 hour(s)).  Janene Madeira, MSN, NP-C [facility] Pager: [phone]  _1 @ 10:35 AM

## 2021-06-26 NOTE — Plan of Care (Signed)
All goals met. Patient discharged at this time in care of family.

## 2021-06-26 NOTE — Progress Notes (Signed)
PROGRESS NOTE   Subjective/Complaints:  Pt asking/really wants to go home.   Says has appointment with pain mgmt at 2:15 pm, so doesn't need Rx for pain meds.    ROS:   Pt denies SOB, abd pain, CP, N/V/C/D, and vision changes    Objective:   No results found. Recent Labs    06/25/21 0313  WBC 9.6  HGB 10.8*  HCT 32.9*  PLT 451*     Recent Labs    06/25/21 0313  NA 134*  K 4.1  CL 100  CO2 28  GLUCOSE 86  BUN 12  CREATININE 0.55*  CALCIUM 9.3      Intake/Output Summary (Last 24 hours) at 06/26/2021 4917 Last data filed at 06/26/2021 0700 Gross per 24 hour  Intake 740 ml  Output 1200 ml  Net -460 ml     Pressure Injury 06/11/21 Buttocks Left Stage 1 -  Intact skin with non-blanchable redness of a localized area usually over a bony prominence. (Active)  06/11/21 1617  Location: Buttocks  Location Orientation: Left  Staging: Stage 1 -  Intact skin with non-blanchable redness of a localized area usually over a bony prominence.  Wound Description (Comments):   Present on Admission: Yes     Pressure Injury 06/11/21 Buttocks Right Stage 1 -  Intact skin with non-blanchable redness of a localized area usually over a bony prominence. (Active)  06/11/21 1618  Location: Buttocks  Location Orientation: Right  Staging: Stage 1 -  Intact skin with non-blanchable redness of a localized area usually over a bony prominence.  Wound Description (Comments):   Present on Admission: Yes    Physical Exam: Vital Signs Blood pressure 115/74, pulse 68, temperature 98.1 F (36.7 C), temperature source Oral, resp. rate 17, height _0  (1.778 m), weight 76.3 kg, SpO2 97 %.    General: awake, alert, appropriate, laying in bed- wished pt a happy birthday! NAD HENT: conjugate gaze; oropharynx moist CV: regular rate; no JVD Pulmonary: CTA B/L; no W/R/R- good air movement GI: soft, NT, ND, (+)BS Psychiatric:  appropriate Neuro: Ox3- appropriate, interactive RLE_ HF 4-/5; KE 4-/5, DF 4-5 and PF 4/5 LLE- HF 2-/5 at best; KE 2- to 2/5, ADF 3-/5 and PF 3/5--appears fairly comparable to past exams  Skin: Warm and dry.  Intact. Back incision scabbed. PICC line looks ok, no change Musc: No edema in extremities.  No tenderness in extremities.    Assessment/Plan: 1. Functional deficits which require 3+ hours per day of interdisciplinary therapy in a comprehensive inpatient rehab setting. Physiatrist is providing close team supervision and 24 hour management of active medical problems listed below. Physiatrist and rehab team continue to assess barriers to discharge/monitor patient progress toward functional and medical goals  Care Tool:  Bathing    Body parts bathed by patient: Right arm, Left arm, Chest, Abdomen, Right upper leg, Left upper leg, Face, Front perineal area, Buttocks   Body parts bathed by helper: Right lower leg, Left lower leg     Bathing assist Assist Level: Minimal Assistance - Patient > 75%     Upper Body Dressing/Undressing Upper body dressing   What is the patient wearing?: Pull  over shirt    Upper body assist Assist Level: Set up assist    Lower Body Dressing/Undressing Lower body dressing      What is the patient wearing?: Pants, Underwear/pull up     Lower body assist Assist for lower body dressing: Minimal Assistance - Patient > 75% (w/ reacher)     Toileting Toileting    Toileting assist Assist for toileting: Minimal Assistance - Patient > 75%     Transfers Chair/bed transfer  Transfers assist     Chair/bed transfer assist level: Moderate Assistance - Patient 50 - 74%     Locomotion Ambulation   Ambulation assist   Ambulation activity did not occur: Safety/medical concerns  Assist level: Minimal Assistance - Patient > 75% Assistive device: Walker-rolling Max distance: 50   Walk 10 feet activity   Assist  Walk 10 feet activity did  not occur: Safety/medical concerns  Assist level: Minimal Assistance - Patient > 75% Assistive device: Walker-rolling   Walk 50 feet activity   Assist Walk 50 feet with 2 turns activity did not occur: Safety/medical concerns  Assist level: Minimal Assistance - Patient > 75% Assistive device: Walker-rolling    Walk 150 feet activity   Assist Walk 150 feet activity did not occur: Safety/medical concerns         Walk 10 feet on uneven surface  activity   Assist Walk 10 feet on uneven surfaces activity did not occur: Safety/medical concerns         Wheelchair     Assist Is the patient using a wheelchair?: Yes Type of Wheelchair: Manual Wheelchair activity did not occur: Safety/medical concerns  Wheelchair assist level: Supervision/Verbal cueing Max wheelchair distance: 332f    Wheelchair 50 feet with 2 turns activity    Assist    Wheelchair 50 feet with 2 turns activity did not occur: Safety/medical concerns   Assist Level: Supervision/Verbal cueing   Wheelchair 150 feet activity     Assist  Wheelchair 150 feet activity did not occur: Safety/medical concerns   Assist Level: Supervision/Verbal cueing   Blood pressure 115/74, pulse 68, temperature 98.1 F (36.7 C), temperature source Oral, resp. rate 17, height _0  (1.778 m), weight 76.3 kg, SpO2 97 %.  Medical Problem List and Plan: 1.  Decline in mobility and ADLs secondary to LLE radiculopathy resulting in septic lumbar discitis with LE weakness/myelopathy  -Continue CIR therapies including PT, OT  D/c today- will send labs to ID and has f/u already scheduled- will need f/u with me 2.  Antithrombotics: -DVT/anticoagulation:  Mechanical: Sequential compression devices, below knee Bilateral lower extremities  Lovenox on board 11/1-won't go home since hasn't really been walking since June- per CHEST guidelines             -antiplatelet therapy: N/A 3. Pain: still has stabbing/burning  bilateral hip/pelvic pain.  Continue robaxin 1000 mg TID.              --MS contin was added 10/17 in addition to prn oxycodone 10/24- changed Exalgo back to MS contin 30 mg B ID per pt request- will wait on changing to percocet.   10/25- pain is doing better per pt with MS Contin- will con't current regimen and monitor- also c/o more muscle spasms- will add Baclofen 5 mg TID; allergic to Zanaflex- 11/1- won't need Rx's for pain meds- has appt with Pain mgmt- today-   4. Mood: LCSW to follow for evaluation and support. Team to provide ego support.              -  antipsychotic agents: N/a 5. Neuropsych: This patient is capable of making decisions on his own behalf. 6. Skin/Wound Care: Routine pressure relief measures.  7. Fluids/Electrolytes/Nutrition: Monitor I/Os 8. 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 --Antibiotics to continue for 6 weeks? -lumbar wound cultures without growth.  10/19- ID - will need IV Abx for 8 weeks from 10/11- surgery date- changed Vanc to IV Dapto and con't Rifampin 10/24- CRP up slightly and ESR up 10 points as well- will recheck next Monday and if still elevating, will call ID. 10/26- will check in AM- since leaving next Tuesday  10/27- ESR 66- up; CRP 12.4- up; and WBC 10.8- up- also has worsening weakness- called ID and ordered MRI of lumbar spine with and without contrast and called Dr Ellene Route- will see what results are. 10/28- MRI looks stable per [facility]- spoke with ID- will change back to Vanc and follow closely.   -10/31 ESR 70, WBC's 9.6 -ID recs vanc/rifampin thru 07/31/21  -outpt ID f/u 11/22 with Dr. Yvette Rack  -outpt NS f/u with [facility] 11/1- has appointments as above- d/w pt 9. Low grade fevers: Resolved  10/27- having again- will monitor- as per #8 10. T2DM with peripheral neuropathy/ Hgb A1c-8.4  (was 10.2  01/2021). Will monitor BS ac/hs  --ON insulin glargine  8 units and metformin daily.             --Use  SSI for elevated BS/tighter control to help promote wound healing.   10/31- CBG's controlled 11. Episodic Hypotension: Has resolved. Continue to monitor BP TID.  12. Hyponatremia: Monitor as continue to vary from 130-133 13. Acute blood loss anemia: Monitor H/H for trend/recovery.  Hemoglobin 10.8 10/31 14. Leucocytosis: Monitor for fevers and other signs of worsening of infection  See #8                15. Low protein stores:Continue prosource BID. Ensure max once daily.              --Continue vitamins/Zinc also.  24. Dispo   10/26- addressed pt's behavior with other staff members- I need him to be more polite  10/27- explained to pt will likely need to stay longer- and apologized, but explained this could be serious and needs to be addressed.    10/31 dc home 11/1  11/1- d/c today  LOS: 15 days A FACE TO FACE EVALUATION WAS PERFORMED  Garlan Drewes 06/26/2021, 8:37 AM

## 2021-06-26 NOTE — Discharge Summary (Signed)
Physician Discharge Summary  Patient ID: [facility] MRN: [id number] DOB/AGE: [birth year] 55 y.o.  Admit date: 06/11/2021 Discharge date: 06/26/2021  Discharge Diagnoses:  Principal Problem:   Septic discitis of lumbar region Active Problems:   Transient weakness of right lower extremity   Acute blood loss anemia   Diabetic peripheral neuropathy (HCC)   Bacteremia   Postoperative pain   MRSA infection   Discharged Condition: stable  Significant Diagnostic Studies: MR Lumbar Spine W Wo Contrast  Result Date: 06/21/2021 CLINICAL DATA:  Spinal cord injury, follow up worsening weakness- in lumbar discitis- s/p previous surgery EXAM: MRI LUMBAR SPINE WITHOUT AND WITH CONTRAST TECHNIQUE: Multiplanar and multiecho pulse sequences of the lumbar spine were obtained without and with intravenous contrast. CONTRAST:  49m GADAVIST GADOBUTROL 1 MMOL/ML IV SOLN COMPARISON:  MRI June 09, 2021. FINDINGS: Segmentation:  Standard.  Same numbering as on prior MRI. Alignment:  Unchanged grade 1 anterolisthesis of L5 on S1. Vertebrae: Postsurgical changes of L3-S1 posterior interbody fusion. Similar active discitis/osteomyelitis at L1-L2 and L2-L3 with marrow edema and enhancement and enhancing endplates with disc edema. No significant change and erosive change of the endplates. No substantial change and enhancing ventral epidural phlegmon at the L1-L2 levels, measuring up to 5 mm in thickness (series 12, image 11). Decompressive laminectomies on the left at L1-L2 and L2-L3. Conus medullaris and cauda equina: Conus extends to the superior L1 level. No evidence of abnormal signal within the conus. Paraspinal and other soft tissues: Similar extensive edema within bilateral psoas musculature with associated enhancement, compatible with paraspinal mastitis. No substantial change and elongated fluid collection in the right psoas muscle. No substantial change in presumed postoperative seroma posterior to the L4  and L5 vertebral bodies. Disc levels: T12-L1: Right paracentral disc protrusion without high-grade stenosis. No change. L1-L2: Left laminectomy. Moderate canal stenosis, similar. Severe bilateral foraminal stenosis, similar. L2-L3: Left laminectomy. Moderate canal stenosis, similar. Severe bilateral foraminal stenosis, similar. L3-L4: Prior fusion and posterior decompression. No evidence of significant residual stenosis. No change. L4-L5: Prior fusion and posterior decompression. I no evidence of significant residual canal or foraminal stenosis. No change. L5-S1: Similar grade 1 anterolisthesis. Prior fusion and posterior decompression. No residual canal stenosis. Similar degree of bilateral foraminal stenosis. IMPRESSION: 1. No substantial change in findings of discitis/osteomyelitis at L1-L2 and L2-L3, described above. 2. Similar enhancing ventral epidural fluid/phlegmon at the L1 and L2 levels, measuring up to 5 mm in thickness. Similar moderate canal stenosis at L1-L2 and L2-L3. 3. Similar paraspinal myositis and small bilateral psoas abscesses. Findings discussed with Dr. HKristeen MissMD via telephone at 12:34 p.m. Electronically Signed   By: FMargaretha SheffieldM.D.   On: 06/21/2021 13:13    [place]orea[place]KG Site Rite  Result Date: 06/20/2021 If Site Rite image not attached, placement could not be confirmed due to current cardiac rhythm.  Labs:  Basic Metabolic Panel: BMP Latest Ref Rng & Units 06/25/2021 06/18/2021 06/12/2021  Glucose 70 - 99 mg/dL 86 121(H) 116(H)  BUN 6 - 20 mg/dL '12 7 9  ' Creatinine 0.61 - 1.24 mg/dL 0.55(L) 0.48(L) 0.47(L)  BUN/Creat Ratio 6 - 22 (calc) - - -  Sodium 135 - 145 mmol/L 134(L) 136 134(L)  Potassium 3.5 - 5.1 mmol/L 4.1 4.1 4.0  Chloride 98 - 111 mmol/L 100 97(L) 98  CO2 22 - 32 mmol/L '28 29 27  ' Calcium 8.9 - 10.3 mg/dL 9.3 8.9 9.2     CBC: CBC Latest Ref Rng & Units 06/25/2021 06/21/2021  06/18/2021  WBC 4.0 - 10.5 K/uL 9.6 10.8(H) 9.8  Hemoglobin 13.0 - 17.0 g/dL  10.8(L) 10.9(L) 10.9(L)  Hematocrit 39.0 - 52.0 % 32.9(L) 34.0(L) 34.0(L)  Platelets 150 - 400 K/uL 451(H) 525(H) 502(H)     CBG: Recent Labs  Lab 06/24/21 1652 06/24/21 2136 06/25/21 0615 06/25/21 1143 06/25/21 1640  GLUCAP 82 115* 81 115* 86    Brief HPI:   John [facility] is a 55 y.o. 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  He was admitted on 05/29/2019 with back pain and LLE weakness x1 week and intermittent fevers.  MRI spine done revealing ongoing discitis with osteomyelitis at L1/L2 and L2/L3 is new/increased ventral epidural phlegmon centered at L1 with severe canal stenosis and compression of cauda equina nerve roots.  Dr. Ellene Route was consulted for input and as patient without neurological improvement and continued weakness, he was taken to the OR on 10/11 for decompression of left L1/2 and L2/3 dural 2 and nerve root of L1-L3.    Postop without improvement in LLE with motor recovery and Dr. Ellene Route recommended follow-up on outpatient basis.   Repeat MRI L-spine 10/15 revealed enhancing epidural collection at L1 and L2 similar in appearance without progression.  Repeat blood cultures were negative advised Vantin was added on 10/14 and cefazolin was changed to vancomycin on 10/15 per Dr. Gloris Ham recommendation.  He continued to have issues with low-grade fevers in the evening and upward trend in white count.  MS Contin was added on 10/17 to help with pain management.  He continued to have impairments in mobility and self-care tasks.  CIR was recommended due to functional decline.   Hospital Course: John Ware was admitted to rehab 06/11/2021 for  inpatient therapies to consist of PT  and OT at least three hours five days a week. Past admission physiatrist, therapy team and rehab RN have worked together to provide customized collaborative inpatient rehab.  Lovenox was used for DVT prophylaxis during his stay.  He continues to report problems with pain control and trial of Exalgo was ineffective.  Medications have been adjusted to MS Contin 45 mg twice daily with Percocet 10 mg every 4 hours as needed pain.  Low-dose gabapentin 100 mg twice daily was added in addition to his 800 mg bedtime dose and has been very effective.  Blood pressures were monitored on TID basis and have been stable.   His diabetes has been monitored with ac/hs CBG checks and SSI was use prn for tighter BS control. Po intake has been good and BS have been well controlled. Serial check of BMET showed  persistent hyponatremia and renal status is stable.  ID recommended changing Vancomycin to daptomycin on 10/19 due to better safety profile and to continue rifampin for 8 weeks from 10/11 surgery date. PICC line was changed out on 10/26 due to ongoing issues with occlusion and obtaining blood return.  On 10/26, patient reported worsening of LE weakness with near fall. Follow up labs on 10/27 showed rise in ESR and CRP as well as white count.   MRI of lumbar spine ordered for work up and showed no significant change in discitis, osteomyelitis or phlegmon.    Dr. Gale Journey were consulted for input and  it was felt that patient could be developing resistant to daptomycin. He was changed to Vancomycin on 10/28 and is to continue rifampin at current dose. Dr. Ellene Route recommended observing at current time and will follow up with patient on outpatient basis.  Repeat CBC of 10/31 showed normalization of WBC and H/H to be stable.  ID recommends vancomycin trough 15-20 with end date of 07/31/2021.  Diclofenac was changed to twice daily as needed at discharge.  Patient's pain is well controlled however due  to recent decline in motor strength, he has had further decline in progress and was unable to meet his goals.  He currently requires min to mod assist overall and will continue to receive follow-up home health PT and RN by [facility] after discharge.   Rehab course: During patient's stay in rehab weekly team conferences were held to monitor patient's progress, set goals and discuss barriers to discharge. At admission, patient required max assist with basic ADL tasks and mod assist with mobility. He  has had improvement in activity tolerance, balance, postural control as well as ability to compensate for deficits.  He had shown improvement was progressing to s CGA to min assist for LB bathing and dressing but with recent decline he has is requiring min to mod assist overall. He requires min to mod assist with stand pivot transfers and is able to stand in II bars with min assist. He was able to propel wheelchair for 300'.  Family education was completed and wife is able to provide care needed.   Disposition: Home  Diet: Carb modified  Special Instructions: Monitor blood sugars ac/hs and continue current regimen till follow-up with PCP.  Discharge Instructions     [facility] pharmacist to adjust dose for Vancomycin, Aminoglycosides and other anti-infective therapies as requested by physician.   Complete by: As directed    [facility] pharmacist to adjust dose for Vancomycin, Aminoglycosides and other anti-infective therapies as requested by physician.   Complete by: As directed    [facility] to provide Cath Flo 20m   Complete by: As directed    Administer for PICC line occlusion and as ordered by physician for other access device issues.   Anaphylaxis Kit: Provided to treat any anaphylactic reaction to the medication being provided to the patient if First Dose or when requested by physician   Complete by: As directed    Epinephrine 176mml vial / amp:  Administer 0.29m30m0.29ml44mubcutaneously once for moderate to severe anaphylaxis, nurse to call physician and pharmacy when reaction occurs and call 911 if needed for immediate care   Diphenhydramine 50mg39mIV vial: Administer 25-50mg 30mM PRN for first dose reaction, rash, itching, mild reaction, nurse to call physician and pharmacy when reaction occurs   Sodium Chloride 0.9% NS 500ml I35mdminister if needed for hypovolemic blood pressure drop or as ordered by physician after call to physician with anaphylactic reaction   Change dressing on IV access line weekly and PRN   Complete by: As directed    Flush IV access with Sodium Chloride 0.9% and Heparin 10 units/ml or 100 units/ml   Complete by: As directed    Home infusion instructions - [facility]   Complete by: As directed    Instructions: Flush IV access with Sodium Chloride 0.9% and Heparin 10units/ml or 100units/ml   Change dressing on IV access line: Weekly and PRN   Instructions Cath Flo 2mg: Ad1mister for PICC Line occlusion and as  ordered by physician for other access device   [facility] pharmacist to adjust dose for: Vancomycin, Aminoglycosides and other anti-infective therapies as requested by physician   Method of administration may be changed at the discretion of home infusion pharmacist based upon assessment of the patient and/or caregiver's ability to self-administer the medication ordered   Complete by: As directed       Allergies as of 06/26/2021       Reactions   Tizanidine Other (See Comments)   Leg cramps        Medication List     STOP taking these medications    buPROPion 150 MG 24 hr tablet Commonly known as: WELLBUTRIN XL   ferrous sulfate 325 (65 FE) MG tablet   magic mouthwash Soln       TAKE these medications    acetaminophen 500 MG tablet Commonly known as: TYLENOL Take 1 tablet (500 mg total) by mouth every 6 (six) hours as needed. What changed:  when to take  this reasons to take this   ascorbic acid 500 MG tablet Commonly known as: VITAMIN C Take 1 tablet (500 mg total) by mouth 2 (two) times daily. Notes to patient: Over the counter   Baclofen 5 MG Tabs Take 5 mg by mouth 3 (three) times daily.   diclofenac 75 MG EC tablet Commonly known as: VOLTAREN Take 1 tablet (75 mg total) by mouth 2 (two) times daily as needed. Limit this as it can affect your kidney function.   Ensure Max Protein Liqd Take 330 mLs (11 oz total) by mouth at bedtime.   FreeStyle Libre 2 Sensor Misc Needs to check BS QID   gabapentin 100 MG capsule Commonly known as: NEURONTIN Take 1 capsule (100 mg total) by mouth 2 (two) times daily. What changed: You were already taking a medication with the same name, and this prescription was added. Make sure you understand how and when to take each.   gabapentin 800 MG tablet Commonly known as: NEURONTIN Take 1 tablet (800 mg total) by mouth at bedtime. What changed: See the new instructions.   insulin glargine-yfgn 100 UNIT/ML Pen Commonly known as: Semglee (yfgn) Inject 8 Units into the skin daily.   metFORMIN 750 MG 24 hr tablet Commonly known as: GLUCOPHAGE-XR Take 1,500 mg by mouth daily with breakfast.   methocarbamol 500 MG tablet Commonly known as: ROBAXIN Take 2 tablets (1,000 mg total) by mouth 3 (three) times daily. What changed:  how much to take when to take this reasons to take this   morphine 15 MG 12 hr tablet Commonly known as: MS CONTIN Take 3 tablets (45 mg total) by mouth 2 (two) times daily. What changed:  medication strength how much to take   multivitamin with minerals Tabs tablet Take 1 tablet by mouth daily. Start taking on: June 27, 2021   naloxone 0.4 MG/ML injection Commonly known as: NARCAN Inject 1 mL (0.4 mg total) into the vein as needed.   oxyCODONE-acetaminophen 10-325 MG tablet Commonly known as: PERCOCET Take 1 tablet by mouth every 6 (six) hours as needed  (breakthrough pain). What changed: when to take this   Pen Needles 32G X 6 MM Misc 1 application by Does not apply route daily.   polyethylene glycol 17 g packet Commonly known as: MIRALAX / GLYCOLAX Take 17 g by mouth daily as needed for mild constipation.   rifampin 300 MG capsule Commonly known as: RIFADIN Take 1 capsule (300 mg total) by  mouth every 12 (twelve) hours. What changed: Another medication with the same name was removed. Continue taking this medication, and follow the directions you see here.   senna-docusate 8.6-50 MG tablet Commonly known as: Senokot-S Take 2 tablets by mouth daily. Notes to patient: Over the counter   vancomycin  IVPB Inject 1,250 mg into the vein every 12 (twelve) hours. Indication:  osteomylitis First Dose: No Last Day of Therapy:  07/31/2021 Labs - "Sunday/Monday:  CBC/D, BMP, and vancomycin trough. Labs - Thursday:  BMP and vancomycin trough Labs - Every other week:  ESR and CRP Method of administration:Elastomeric Method of administration may be changed at the discretion of the patient and/or caregiver's ability to self-administer the medication ordered.   zinc sulfate 220 (50 Zn) MG capsule Take 1 capsule (220 mg total) by mouth daily. Start taking on: June 27, 2021 Notes to patient: Over the counter               Discharge Care Instructions  (From admission, onward)           Start     Ordered   06/25/21 0000  Change dressing on IV access line weekly and PRN  (Home infusion instructions - [facility] )        10" /31/22 1017            Follow-up Information     Sharion Balloon, FNP Follow up.   Specialty: Family Medicine Contact information: [place] [address] [phone]         Rosiland Oz, MD Follow up on 07/17/2021.   Specialty: Infectious Diseases Why: Appointment at 4 pm Contact information: [address] [place]  [address] [phone]         Kristeen Miss, MD. Call.   Specialty: Neurosurgery Why: for post op appointment Contact information: [address] [place] [address] [place] [address] [phone]                 Signed: Bary Leriche 06/26/2021, 5:33 PM

## 2021-06-27 ENCOUNTER — Encounter: Payer: Self-pay | Admitting: Physical Medicine and Rehabilitation

## 2021-06-27 ENCOUNTER — Telehealth: Payer: Self-pay | Admitting: Family

## 2021-06-27 ENCOUNTER — Telehealth: Payer: Self-pay

## 2021-06-27 DIAGNOSIS — M48062 Spinal stenosis, lumbar region with neurogenic claudication: Secondary | ICD-10-CM

## 2021-06-27 NOTE — Telephone Encounter (Signed)
REFERRAL REQUEST Telephone Note  Have you been seen at our office for this problem? YES (Advise that they may need an appointment with their PCP before a referral can be done)  Reason for Referral: Wants to start going to another pain clinic.Quit [facility] 11-1. Didn't have $600 to pay them. Referral discussed with patient: Yes the one in the past  Best contact number of patient for referral team: [phone]    Has patient been seen by a specialist for this issue before: [facility]  Patient provider preference for referral: [facility] Patient location preference for referral: [place]   Patient notified that referrals can take up to a week or longer to process. If they haven't heard anything within a week they should call back and speak with the referral department.

## 2021-06-27 NOTE — Telephone Encounter (Signed)
Transition Care Management Follow-up Telephone Call Date of discharge and from where: [facility] 06[facility]26[facility]21 Diagnosis: septic discitis of lumbar region How have you been since you were released from the hospital? Doing the same - still cannot walk - PT is working with him Any questions or concerns? No  Items Reviewed: Did the pt receive and understand the discharge instructions provided? Yes  Medications obtained and verified? Yes  Other? No  Any new allergies since your discharge? No  Dietary orders reviewed? Yes Do you have support at home? Yes   Home Care and Equipment/Supplies: Were home health services ordered? yes If so, what is the name of the agency? [facility]  Has the agency set up a time to come to the patient's home? yes Were any new equipment or medical supplies ordered?  Yes: wheelchair, bedside commode, 3 in 1, shower chair What is the name of the medical supply agency? Unsure - [employer] he thinks Were you able to get the supplies/equipment? yes Do you have any questions related to the use of the equipment or supplies? No  Functional Questionnaire: (I = Independent and D = Dependent) ADLs: D  Bathing/Dressing- D  Meal Prep- D  Eating- I  Maintaining continence- D  Transferring/Ambulation- D  Managing Meds- I  Follow up appointments reviewed:  PCP Hospital f/u appt confirmed? No  He declined appt today - says he cannot walk and it is too much of a struggle getting him in and out of vehicles - says he will call back for appt when he is able to get around better Specialist Hospital f/u appt confirmed? Yes  Scheduled to see John Ware on 11/22 @ 4pm, [facility] 12/9/ @ 1pm, and [facility] 12/12 @ 11:30 Are transportation arrangements needed?  More than likely - he will let us know If their condition worsens, is the pt aware to call PCP or go to the Emergency Dept.? Yes Was the patient provided with contact information for the PCP's office or ED? Yes Was to pt  encouraged to call back with questions or concerns? Yes

## 2021-06-28 NOTE — Telephone Encounter (Signed)
Referral to Pain Clinic 

## 2021-06-28 NOTE — Telephone Encounter (Signed)
Patient aware of Rx. He will see a pain specialist.

## 2021-07-02 ENCOUNTER — Telehealth: Payer: Self-pay

## 2021-07-02 MED ORDER — MORPHINE SULFATE ER 15 MG PO TBCR: 45.0000 mg | EXTENDED_RELEASE_TABLET | Freq: Two times a day (BID) | ORAL | 0 refills | Status: DC

## 2021-07-02 MED ORDER — OXYCODONE-ACETAMINOPHEN 10-325 MG PO TABS: 1.0000 | ORAL_TABLET | Freq: Four times a day (QID) | ORAL | 0 refills | Status: DC | PRN

## 2021-07-02 NOTE — Telephone Encounter (Signed)
Patient is calling to request a week's worth of his pain medication until he sees [facility] next week. Please advise.

## 2021-07-02 NOTE — Telephone Encounter (Signed)
Patient notified

## 2021-07-05 ENCOUNTER — Other Ambulatory Visit: Payer: Self-pay | Admitting: Family

## 2021-07-06 ENCOUNTER — Telehealth: Payer: Self-pay

## 2021-07-06 NOTE — Telephone Encounter (Addendum)
[facility] called:   Per patient his appointment is with the Pain Clinic on 08/07/2021. He wanted to know if you will cover his pain medication until he get in? Or will you be sending scripts weekly?  Call back phone [phone].

## 2021-07-09 MED ORDER — MORPHINE SULFATE ER 15 MG PO TBCR: 45.0000 mg | EXTENDED_RELEASE_TABLET | Freq: Two times a day (BID) | ORAL | 0 refills | Status: DC

## 2021-07-09 MED ORDER — OXYCODONE-ACETAMINOPHEN 10-325 MG PO TABS: 1.0000 | ORAL_TABLET | Freq: Four times a day (QID) | ORAL | 0 refills | Status: DC | PRN

## 2021-07-09 NOTE — Addendum Note (Signed)
Addended by: Genice Rouge on: 07/09/2021 09:31 AM   Modules accepted: Orders

## 2021-07-10 ENCOUNTER — Telehealth: Payer: Self-pay | Admitting: *Deleted

## 2021-07-10 NOTE — Telephone Encounter (Signed)
Prior auth for Morphine sulfate ER 15 mg iii (45 mg) bid submitted to insurance via CoverMyMeds. Await response.

## 2021-07-11 DIAGNOSIS — Z7984 Long term (current) use of oral hypoglycemic drugs: Secondary | ICD-10-CM

## 2021-07-11 DIAGNOSIS — M4626 Osteomyelitis of vertebra, lumbar region: Secondary | ICD-10-CM

## 2021-07-11 DIAGNOSIS — I10 Essential (primary) hypertension: Secondary | ICD-10-CM

## 2021-07-11 DIAGNOSIS — G061 Intraspinal abscess and granuloma: Secondary | ICD-10-CM

## 2021-07-11 DIAGNOSIS — Z87891 Personal history of nicotine dependence: Secondary | ICD-10-CM

## 2021-07-11 DIAGNOSIS — M4317 Spondylolisthesis, lumbosacral region: Secondary | ICD-10-CM

## 2021-07-11 DIAGNOSIS — E1142 Type 2 diabetes mellitus with diabetic polyneuropathy: Secondary | ICD-10-CM

## 2021-07-11 DIAGNOSIS — Z792 Long term (current) use of antibiotics: Secondary | ICD-10-CM

## 2021-07-11 DIAGNOSIS — F32A Depression, unspecified: Secondary | ICD-10-CM

## 2021-07-11 DIAGNOSIS — Z9181 History of falling: Secondary | ICD-10-CM

## 2021-07-11 DIAGNOSIS — M5416 Radiculopathy, lumbar region: Secondary | ICD-10-CM

## 2021-07-11 DIAGNOSIS — M4646 Discitis, unspecified, lumbar region: Secondary | ICD-10-CM

## 2021-07-11 DIAGNOSIS — D62 Acute posthemorrhagic anemia: Secondary | ICD-10-CM

## 2021-07-11 DIAGNOSIS — G4700 Insomnia, unspecified: Secondary | ICD-10-CM

## 2021-07-11 DIAGNOSIS — D509 Iron deficiency anemia, unspecified: Secondary | ICD-10-CM

## 2021-07-11 DIAGNOSIS — Z452 Encounter for adjustment and management of vascular access device: Secondary | ICD-10-CM

## 2021-07-11 DIAGNOSIS — Z794 Long term (current) use of insulin: Secondary | ICD-10-CM

## 2021-07-11 DIAGNOSIS — M48062 Spinal stenosis, lumbar region with neurogenic claudication: Secondary | ICD-10-CM

## 2021-07-11 NOTE — Telephone Encounter (Signed)
PA approved 06/10/2021 through 01/06/2022

## 2021-07-17 ENCOUNTER — Ambulatory Visit (INDEPENDENT_AMBULATORY_CARE_PROVIDER_SITE_OTHER): Payer: Commercial Managed Care - PPO | Admitting: Infectious Diseases

## 2021-07-17 ENCOUNTER — Telehealth: Payer: Self-pay

## 2021-07-17 ENCOUNTER — Other Ambulatory Visit: Payer: Self-pay

## 2021-07-17 VITALS — BP 171/75 | HR 87 | Temp 98.4°F

## 2021-07-17 DIAGNOSIS — R7881 Bacteremia: Secondary | ICD-10-CM | POA: Insufficient documentation

## 2021-07-17 DIAGNOSIS — Z5181 Encounter for therapeutic drug level monitoring: Secondary | ICD-10-CM | POA: Diagnosis not present

## 2021-07-17 DIAGNOSIS — G062 Extradural and subdural abscess, unspecified: Secondary | ICD-10-CM | POA: Diagnosis not present

## 2021-07-17 DIAGNOSIS — B9562 Methicillin resistant Staphylococcus aureus infection as the cause of diseases classified elsewhere: Secondary | ICD-10-CM | POA: Insufficient documentation

## 2021-07-17 NOTE — Telephone Encounter (Signed)
Per MD reached out to [facility] to extend IV antbx x2 weeks. New end date will be 12/6 after follow up appointment. HH updated. Juanita Laster, RMA

## 2021-07-17 NOTE — Telephone Encounter (Signed)
Thank you :)

## 2021-07-17 NOTE — Progress Notes (Addendum)
[facility]                                                             [place], [place], [place], [address]                                                                  Phn. 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 End date 07/31/21 Fu repeat MRI L spine in 1 week before end of 8 weeks Fu in 2 weeks, will plan to switch IV to PO abtx at that time Continue with PT and OT Fu with Neurosurgery   All questions and concerns were discussed and addressed. Patient verbalized understanding of the plan. ____________________________________________________________________________________________________________________ Subjective/Interval Events 07/17/21 After last seen by me on 10/19, patient had a repeat MRI done for worsening LLE>> RLE weakness/elevated inflammatory markers with a fall. MRI 10/27 with no worsening findings. Daptomycin was switched to Vancomycin due to concerns for developing resistance with need for longer duration of tx. Patient was discharged from Inpatient rehab on 06/26/21.  He is accompanied by his wife. Getting IV Vancomycin and PO Rifampin without any issues like nausea, vomiting, rashes or diarrhea. Denies any pain/tenderness and swelling at the PICC site. He states he has improvement in the strength of his lower  extremities although he still has proximal rt leg weakness. He is able to lift his rt leg above gravity which he was not able to do previously. He needs assistance to walk. He is doing PT and is supposed to follow up with Neurosurgery. Back pain has significantly improved, some soreness+. Appetite is good and thinks he has gained some weight.   ROS: Constitutional: Negative for fever, chills,  appetite change, fatigue and unexpected weight change.  Respiratory: Negative for cough, shortness of breath Cardiovascular: Negative for chest pain, palpitations and leg swelling.  Gastrointestinal: Negative for nausea, vomiting, abdominal pain, diarrhea/constipation, .  Genitourinary: Negative for dysuria, hematuria, flank pain Musculoskeletal: Negative for myalgias, arthralgia,  joint swelling, arthralgias Skin: Negative for rashes, lesions  Neurological: Negative for dizziness or headache. Left leg weakness  Past Medical History:  Diagnosis  Date   Depression    Diabetes mellitus without complication ([place])    Hypertension    Spinal stenosis    With Neurogenic Claudication   Past Surgical History:  Procedure Laterality Date   CARPAL TUNNEL RELEASE Bilateral    INCISION AND DRAINAGE ABSCESS Left 03/12/2021   Procedure: INCISION AND DRAINAGE ABSCESS;  Surgeon: Marchia Bond, MD;  Location: [facility];  Service: Orthopedics;  Laterality: Left;   IR US GUIDE BX ASP/DRAIN  02/02/2021   IRRIGATION AND DEBRIDEMENT ABSCESS Left 02/08/2021   Procedure: MINOR INCISION AND DRAINAGE OF ABSCESS;  Surgeon: Marchia Bond, MD;  Location: [facility];  Service: Orthopedics;  Laterality: Left;   IRRIGATION AND DEBRIDEMENT ABSCESS Left 02/10/2021   Procedure: IRRIGATION AND DEBRIDEMENT ABSCESS LEFT LATERAL CHEST WALL;  Surgeon: Greer Pickerel, MD;  Location: [facility];  Service: General;  Laterality: Left;   IRRIGATION AND DEBRIDEMENT ABSCESS Bilateral 02/20/2021   Procedure: MINOR INCISION AND DRAINAGE OF ABSCESS/WITH BILATERAL  WOUND VAC PLACEMENT;  Surgeon: Marchia Bond, MD;  Location: [facility];  Service: Orthopedics;  Laterality: Bilateral;   IRRIGATION AND DEBRIDEMENT SHOULDER Bilateral 02/28/2021   Procedure: IRRIGATION AND DEBRIDEMENT SHOULDER;  Surgeon: Hiram Gash, MD;  [facility]: [facility];  Service: Orthopedics;  Laterality: Bilateral;   IRRIGATION AND DEBRIDEMENT SHOULDER Left 03/06/2021   Procedure: IRRIGATION AND DEBRIDEMENT SHOULDER;  Surgeon: Renette Butters, MD;  [facility]: [facility];  Service: Orthopedics;  Laterality: Left;   IRRIGATION AND DEBRIDEMENT SHOULDER Left 03/08/2021   Procedure: IRRIGATION AND DEBRIDEMENT SHOULDER;  Surgeon: Marchia Bond, MD;  [facility];  Service: Orthopedics;  Laterality: Left;   LUMBAR LAMINECTOMY/DECOMPRESSION MICRODISCECTOMY N/A 12/13/2014   Procedure: LUMBAR THREE-FOUR, LUMBAR FOUR-FIVE, LUMBAR FIVE-SACRAL ONE LAMINECTOMY;  Surgeon: Kristeen Miss, MD;  Location: [facility];  Service: Neurosurgery;  Laterality: N/A;  L3-4 L4-5 L5-S1 Laminectomy   LUMBAR LAMINECTOMY/DECOMPRESSION MICRODISCECTOMY Left 06/05/2021   Procedure: Left lumbar one-two, Lumbar two-three Laminectomy;  Surgeon: Kristeen Miss, MD;  Location: [facility];  Service: Neurosurgery;  Laterality: Left;   MENISCUS REPAIR     Right knee   MINOR APPLICATION OF WOUND VAC Left 02/20/2021   Procedure: MINOR APPLICATION OF KERLIX PACKING/REMOVAL OF WOUND VAC;  Surgeon: Johnathan Hausen, MD;  [facility];  Service: General;  Laterality: Left;   TEE WITHOUT CARDIOVERSION N/A 02/05/2021   Procedure: TRANSESOPHAGEAL ECHOCARDIOGRAM (TEE);  Surgeon: Sueanne Margarita, MD;  [facility];  Service: Cardiovascular;  Laterality: N/A;   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 30 tablet 0   ascorbic acid (VITAMIN C) 500 MG tablet Take 1 tablet (500 mg total) by mouth 2 (two) times daily. 60 tablet 0   Baclofen 5 MG TABS Take 5 mg by mouth 3 (three) times daily. 90 tablet 0   Continuous Blood Gluc Sensor (FREESTYLE LIBRE 2 SENSOR) MISC Needs to check BS QID 1 each 11   Ensure Max Protein (ENSURE MAX PROTEIN) LIQD Take 330 mLs (11 oz total) by mouth at bedtime.     gabapentin (NEURONTIN)  100 MG capsule Take 1 capsule (100 mg total) by mouth 2 (two) times daily. 60 capsule 0   gabapentin (NEURONTIN) 800 MG tablet Take 1 tablet (800 mg total) by mouth at bedtime. 30 tablet 0   insulin glargine-yfgn (SEMGLEE, YFGN,) 100 UNIT/ML Pen Inject 8 Units into the skin daily. 15 mL 0   Insulin Pen Needle (PEN NEEDLES) 32G X 6 MM MISC 1 application by Does not apply route daily. 100 each 1   metFORMIN (GLUCOPHAGE-XR) 750 MG 24 hr tablet Take 1,500 mg by mouth daily with breakfast.     methocarbamol (ROBAXIN) 500 MG tablet Take 2 tablets (1,000 mg total) by mouth 3 (three) times daily. 180 tablet 0   morphine (MS CONTIN) 15 MG 12 hr tablet Take 3 tablets (45 mg total) by mouth every 12 (twelve) hours. 180 tablet 0   Multiple Vitamin (MULTIVITAMIN WITH MINERALS) TABS tablet Take 1 tablet by mouth daily.     naloxone (NARCAN) 0.4 MG/ML injection Inject 1 mL (0.4 mg total) into the vein as needed. 1 mL 0   oxyCODONE-acetaminophen (PERCOCET) 10-325 MG tablet Take 1 tablet by mouth every 6 (six) hours as needed (breakthrough pain). 120 tablet 0   rifampin (RIFADIN) 300 MG capsule Take 1 capsule (300 mg total) by mouth every 12 (twelve) hours. 60 capsule 0   vancomycin IVPB Inject 1,250 mg into the vein every 12 (twelve) hours. Indication:  osteomylitis First Dose: No Last Day of Therapy:  07/31/2021 Labs -  Sunday/Monday:  CBC/D, BMP, and vancomycin trough. Labs - Thursday:  BMP and vancomycin trough Labs - Every other week:  ESR and CRP Method of administration:Elastomeric Method of administration may be changed at the discretion of the patient and/or caregiver's ability to self-administer the medication ordered. 105 Units 0   zinc sulfate 220 (50 Zn) MG capsule Take 1 capsule (220 mg total) by mouth daily. 30 capsule 0   No current facility-administered medications on file prior to visit.   Allergies  Allergen Reactions   Acetaminophen    Oxycodone Hcl     Other reaction(s): Trouble Urinating   Tizanidine Other (See Comments)    Leg cramps   Social History   Socioeconomic History   Marital status: Married    Spouse name: Not on file   Number of children: Not on file   Years of education: Not on file   Highest education level: Not on file  Occupational History   Not on file  Tobacco Use   Smoking status: Former    Packs/day: 2.00    Years: 15.00    Pack years: 30.00    Types: Cigarettes    Quit date: 2012    Years since quitting: 10.8   Smokeless tobacco: Never  Vaping Use   Vaping Use: Never used  Substance and Sexual Activity   Alcohol use: Yes    Comment: occasional   Drug use: No   Sexual activity: Not Currently  Other Topics Concern   Not on file  Social History Narrative   Not on file   Social Determinants of Health   Financial Resource Strain: Not on file  Food Insecurity: Not on file  Transportation Needs: Not on file  Physical Activity: Not on file  Stress: Not on file  Social Connections: Not on file  Intimate Partner Violence: Not on file   Family History  Problem Relation Age of Onset   Cancer Mother    Colon cancer Neg Hx   ,  Vitals BP (!) 171/75   Pulse 87   Temp 98.4 F (36.9 C) (Oral)   SpO2 95%    Examination  General - not in acute distress, comfortably sitting in chair HEENT - PEERLA, no pallor and no icterus Chest - b/l  clear air entry, no additional sounds CVS- Normal s1s2, RRR Abdomen - Soft, Non tender , non distended Ext- no pedal edema Neuro: Left leg weakness( proximal 3/5>> distal 4/5). Rt leg proximal and distal 4+/5 Psych : calm and cooperative   Recent labs CBC Latest Ref Rng & Units 06/25/2021 06/21/2021 06/18/2021  WBC 4.0 - 10.5 K/uL 9.6 10.8(H) 9.8  Hemoglobin 13.0 - 17.0 g/dL 10.8(L) 10.9(L) 10.9(L)  Hematocrit 39.0 - 52.0 % 32.9(L) 34.0(L) 34.0(L)  Platelets 150 - 400 K/uL 451(H) 525(H) 502(H)   CMP Latest Ref Rng & Units 06/25/2021 06/18/2021 06/12/2021  Glucose 70 - 99 mg/dL 86 121(H) 116(H)  BUN 6 - 20 mg/dL _0 Creatinine 0.61 - 1.24 mg/dL 0.55(L) 0.48(L) 0.47(L)  Sodium 135 - 145 mmol/L 134(L) 136 134(L)  Potassium 3.5 - 5.1 mmol/L 4.1 4.1 4.0  Chloride 98 - 111 mmol/L 100 97(L) 98  CO2 22 - 32 mmol/L _1 Calcium 8.9 - 10.3 mg/dL 9.3 8.9 9.2  Total Protein 6.5 - 8.1 g/dL - - 7.2  Total Bilirubin 0.3 - 1.2 mg/dL - - 0.3  Alkaline Phos 38 - 126 U/L - - 80  AST 15 - 41 U/L - - 11(L)  ALT 0 - 44 U/L - - 10     Pertinent Microbiology Results for orders placed or performed during the hospital encounter of 05/28/21  Culture, blood (Routine X 2) w Reflex to ID Panel     Status: None   Collection Time: 05/28/21  5:20 PM   Specimen: BLOOD  Result Value Ref Range Status   Specimen Description   Final    BLOOD LEFT ANTECUBITAL Performed at [facility], [employer] [address], [place], [place] [address]    Special Requests   Final    BOTTLES DRAWN AEROBIC AND ANAEROBIC Blood Culture adequate volume Performed at [facility] [address] [place], [place] [address]    Culture   Final    NO GROWTH 5 DAYS Performed at [facility], [place] [address], [place], [place] [address]    Report Status 06/02/2021 FINAL  Final  Culture, blood (Routine X 2) w Reflex to ID Panel     Status: Abnormal   Collection Time: 05/28/21  5:22 PM    Specimen: BLOOD  Result Value Ref Range Status   Specimen Description   Final    BLOOD RIGHT ANTECUBITAL Performed at [facility] [address], [place], [place] [address]    Special Requests   Final    BOTTLES DRAWN AEROBIC AND ANAEROBIC Blood Culture results may not be optimal due to an excessive volume of blood received in culture bottles Performed at [facility] [address] [place],  [address]    Culture  Setup Time   Final    GRAM POSITIVE COCCI ANAEROBIC BOTTLE ONLY CRITICAL RESULT CALLED TO, READ BACK BY AND VERIFIED WITH: PHARMD GREG ABBOTT 05/31/2021_2 :61 BY TW    Culture (A)  Final    STAPHYLOCOCCUS AUREUS Sent to [facility] for further susceptibility testing. SEE SEPARATE REPORT MIC SUSCEPTIBILITY Performed at [employer] Performed at [facility], [address] [place], [place]  [address]    Report Status 06/21/2021 FINAL  Final   Organism ID, Bacteria STAPHYLOCOCCUS AUREUS  Final      Susceptibility   Staphylococcus aureus - MIC*    RIFAMPIN Value in next row Sensitive      SENSITIVE0.5    * STAPHYLOCOCCUS AUREUS  Blood Culture ID Panel (Reflexed)     Status: Abnormal   Collection Time: 05/28/21  5:22 PM  Result Value Ref Range Status   Enterococcus faecalis NOT DETECTED NOT DETECTED Final   Enterococcus Faecium NOT DETECTED NOT DETECTED Final   Listeria monocytogenes NOT DETECTED NOT DETECTED Final   Staphylococcus species DETECTED (A) NOT DETECTED Final    Comment: CRITICAL RESULT CALLED TO, READ BACK BY AND VERIFIED WITH: PHARMD GREG ABBOTT 05/31/2021_0 :56 BY TW    Staphylococcus aureus (BCID) DETECTED (A) NOT DETECTED Final    Comment: CRITICAL RESULT CALLED TO, READ BACK BY AND VERIFIED WITH: PHARMD GREG ABBOTT 05/31/2021_1 :56 BY TW    Staphylococcus epidermidis NOT DETECTED NOT DETECTED Final   Staphylococcus lugdunensis NOT DETECTED NOT DETECTED Final   Streptococcus species NOT DETECTED  NOT DETECTED Final   Streptococcus agalactiae NOT DETECTED NOT DETECTED Final   Streptococcus pneumoniae NOT DETECTED NOT DETECTED Final   Streptococcus pyogenes NOT DETECTED NOT DETECTED Final   A.calcoaceticus-baumannii NOT DETECTED NOT DETECTED Final   Bacteroides fragilis NOT DETECTED NOT DETECTED Final   Enterobacterales NOT DETECTED NOT DETECTED Final   Enterobacter cloacae complex NOT DETECTED NOT DETECTED Final   Escherichia coli NOT DETECTED NOT DETECTED Final   Klebsiella aerogenes NOT DETECTED NOT DETECTED Final   Klebsiella oxytoca NOT DETECTED NOT DETECTED Final   Klebsiella pneumoniae NOT DETECTED NOT DETECTED Final   Proteus species NOT DETECTED NOT DETECTED Final   Salmonella species NOT DETECTED NOT DETECTED Final   Serratia marcescens NOT DETECTED NOT DETECTED Final   Haemophilus influenzae NOT DETECTED NOT DETECTED Final   Neisseria meningitidis NOT DETECTED NOT DETECTED Final   Pseudomonas aeruginosa NOT DETECTED NOT DETECTED Final   Stenotrophomonas maltophilia NOT DETECTED NOT DETECTED Final   Candida albicans NOT DETECTED NOT DETECTED Final   Candida auris NOT DETECTED NOT DETECTED Final   Candida glabrata NOT DETECTED NOT DETECTED Final   Candida krusei NOT DETECTED NOT DETECTED Final   Candida parapsilosis NOT DETECTED NOT DETECTED Final   Candida tropicalis NOT DETECTED NOT DETECTED Final   Cryptococcus neoformans/gattii NOT DETECTED NOT DETECTED Final   Meth resistant mecA/C and MREJ NOT DETECTED NOT DETECTED Final    Comment: Performed at [facility], [address] [place], [place] [address]  Susceptibility, Aer + Anaerob     Status: Abnormal   Collection Time: 05/28/21  5:22 PM  Result Value Ref Range Status   Suscept, Aer + Anaerob Final report (A)  Corrected    Comment: (NOTE) Performed At: [facility] [place], [place] [id number] Rush Farmer MD WC:[id number] CORRECTED ON 10/14 AT 6160: PREVIOUSLY REPORTED AS  Preliminary report    Source of Sample 8,680  Final    Comment: S.AUREUS SUSCEPTIBILITY BLOOD CULTURE Performed at [facility], [place] [address] [place], [place] [address]   Susceptibility Result     Status: Abnormal   Collection Time: 05/28/21  5:22 PM  Result Value Ref Range Status   Suscept Result 1 Staphylococcus aureus (A)  Final    Comment: (NOTE) Identification performed by account, not confirmed by this laboratory. Methicillin resistant (MRSA) Based on resistance to oxacillin  this isolate would be resistant to all currently available beta-lactam antimicrobial agents, with the exception of the newer cephalosporins with anti-MRSA activity, such as Ceftaroline    Antimicrobial Suscept Comment  Corrected    Comment: (NOTE)      ** S = Susceptible; I = Intermediate; R = Resistant **                   P = Positive; N = Negative            MICS are expressed in micrograms per mL   Antibiotic                 RSLT#1    RSLT#2    RSLT#3    RSLT#4 Ciprofloxacin                  R Clindamycin                    S Erythromycin                   R Gentamicin                     S Levofloxacin                   I Linezolid                      S Oxacillin                      R Penicillin                     R Rifampin                       S Tetracycline                   S Trimethoprim/Sulfa             S Vancomycin                     S Performed At: [facility] [employer] [place], [place] [id number] Rush Farmer MD [id number]   Resp Panel by RT-PCR (Flu A&B, Covid) Nasopharyngeal Swab     Status: None   Collection Time: 05/28/21  5:30 PM   Specimen: Nasopharyngeal Swab; Nasopharyngeal(NP) swabs in vial transport medium  Result Value Ref Range Status   SARS Coronavirus 2 by RT PCR NEGATIVE NEGATIVE Final    Comment: (NOTE) SARS-CoV-2 target nucleic acids are NOT DETECTED.  The SARS-CoV-2 RNA is generally detectable in upper respiratory specimens  during the acute phase of infection. The lowest concentration of SARS-CoV-2 viral copies this assay can detect is 138 copies/mL. A negative result does not preclude SARS-Cov-2 infection and should not be used as the sole basis for treatment or other patient management decisions. A negative result may occur with  improper specimen collection/handling, submission of specimen other than nasopharyngeal swab, presence of viral mutation(s) within the areas targeted by this assay, and inadequate number of viral copies(<138 copies/mL). A negative result must be combined with clinical observations, patient history, and epidemiological information. The expected result is Negative.  Fact Sheet for Patients:  EntrepreneurPulse.com.au  Fact Sheet for Healthcare Providers:  IncredibleEmployment.be  This test is no t yet approved or cleared by the [place] and  has been authorized for detection and/or diagnosis of SARS-CoV-2 by FDA under an Emergency Use Authorization (EUA). This EUA will remain  in effect (meaning this test can be used) for the duration of the COVID-19 declaration under Section 564(b)(1) of the Act, 21 U.S.C.section 360bbb-3(b)(1), unless the authorization is terminated  or revoked sooner.       Influenza A by PCR NEGATIVE NEGATIVE Final   Influenza B by PCR NEGATIVE NEGATIVE Final    Comment: (NOTE) The Xpert Xpress SARS-CoV-2/FLU/RSV plus assay is intended as an aid in the diagnosis of influenza from Nasopharyngeal swab specimens and should not be used as a sole basis for treatment. Nasal washings and aspirates are unacceptable for Xpert Xpress SARS-CoV-2/FLU/RSV testing.  Fact Sheet for Patients: EntrepreneurPulse.com.au  Fact Sheet for Healthcare Providers: IncredibleEmployment.be  This test is not yet approved or cleared by the [place] FDA and has been authorized for detection  and/or diagnosis of SARS-CoV-2 by FDA under an Emergency Use Authorization (EUA). This EUA will remain in effect (meaning this test can be used) for the duration of the COVID-19 declaration under Section 564(b)(1) of the Act, 21 U.S.C. section 360bbb-3(b)(1), unless the authorization is terminated or revoked.  Performed at [facility], [place] [address] [place], [place] [address]   Culture, blood (routine x 2)     Status: None   Collection Time: 06/01/21  6:02 AM   Specimen: BLOOD RIGHT HAND  Result Value Ref Range Status   Specimen Description BLOOD RIGHT HAND  Final   Special Requests   Final    AEROBIC BOTTLE ONLY Blood Culture results may not be optimal due to an inadequate volume of blood received in culture bottles   Culture   Final    NO GROWTH 5 DAYS Performed at [facility], [place] [address] [place], [place] [address]    Report Status 06/06/2021 FINAL  Final  Culture, blood (routine x 2)     Status: None   Collection Time: 06/01/21  6:02 AM   Specimen: BLOOD LEFT HAND  Result Value Ref Range Status   Specimen Description BLOOD LEFT HAND  Final   Special Requests   Final    AEROBIC BOTTLE ONLY Blood Culture results may not be optimal due to an inadequate volume of blood received in culture bottles   Culture   Final    NO GROWTH 5 DAYS Performed at [facility], [place] [address] [place], [place] [address]    Report Status 06/06/2021 FINAL  Final  Surgical pcr screen     Status: Abnormal   Collection Time: 06/05/21 11:17 AM   Specimen: Nasal Mucosa; Nasal Swab  Result Value Ref Range Status   MRSA, PCR NEGATIVE NEGATIVE Final   Staphylococcus aureus POSITIVE (A) NEGATIVE Final    Comment: (NOTE) The Xpert SA Assay (FDA approved for NASAL specimens in patients 18 years of age and older), is one component of a comprehensive surveillance program. It is not intended to diagnose infection nor to guide or monitor treatment. Performed at [facility], [place] [address] [place], [place] [address]   Aerobic/Anaerobic Culture w Gram Stain (surgical/deep wound)     Status: None   Collection Time: 06/05/21  6:11 PM   Specimen: Soft Tissue, Other  Result Value Ref Range Status   Specimen Description WOUND LUMBAR  Final   Special Requests LUMBAR DISC PT ON ANCEF  Final   Gram Stain   Final  FEW SQUAMOUS EPITHELIAL CELLS PRESENT MODERATE WBC SEEN NO ORGANISMS SEEN    Culture   Final    No growth aerobically or anaerobically. Performed at [facility], [place] [address] [place], [place] [address]    Report Status 06/10/2021 FINAL  Final     Pertinent Imaging All pertinent labs/Imagings/notes reviewed. All pertinent plain films and CT images have been personally visualized and interpreted; radiology reports have been reviewed. Decision making incorporated into the Impression / Recommendations.  MRI Lumbar spine 06/21/21 IMPRESSION: 1. No substantial change in findings of discitis/osteomyelitis at L1-L2 and L2-L3, described above. 2. Similar enhancing ventral epidural fluid/phlegmon at the L1 and L2 levels, measuring up to 5 mm in thickness. Similar moderate canal stenosis at L1-L2 and L2-L3. 3. Similar paraspinal myositis and small bilateral psoas abscesses.   I have spent a total of 40 minutes of face-to-face and non-face-to-face time, excluding clinical staff time, preparing to see patient, ordering tests and/or medications, and provide counseling the patient   Electronically signed by:  Rosiland Oz, MD Infectious Disease Physician [facility] [address] [place], [place] [address] Phone: [phone]  Fax: [phone]

## 2021-07-24 ENCOUNTER — Inpatient Hospital Stay: Payer: Commercial Managed Care - PPO | Admitting: Infectious Diseases

## 2021-07-25 ENCOUNTER — Other Ambulatory Visit: Payer: Self-pay | Admitting: Physical Medicine and Rehabilitation

## 2021-07-25 DIAGNOSIS — M5137 Other intervertebral disc degeneration, lumbosacral region: Secondary | ICD-10-CM

## 2021-07-27 ENCOUNTER — Telehealth: Payer: Self-pay

## 2021-07-27 NOTE — Telephone Encounter (Signed)
Left voicemail to give verbal orders for an extension for PT for 4 more weeks with 6 visits

## 2021-07-27 NOTE — Telephone Encounter (Signed)
Maurine Minister, PT called requesting an extension on PT for 4 more weeks for 6 visits. He does not see you until 08/03/21 for a hospital follow up. Would you like to extend PT?

## 2021-07-28 ENCOUNTER — Ambulatory Visit (HOSPITAL_COMMUNITY)
Admission: RE | Admit: 2021-07-28 | Discharge: 2021-07-28 | Disposition: A | Discharge status: Home / Self Care | Payer: Commercial Managed Care - PPO | Source: Ambulatory Visit | Attending: Infectious Diseases | Admitting: Infectious Diseases

## 2021-07-28 DIAGNOSIS — G062 Extradural and subdural abscess, unspecified: Secondary | ICD-10-CM | POA: Diagnosis present

## 2021-07-28 MED ORDER — GADOBUTROL 1 MMOL/ML IV SOLN
8.0000 mL | Freq: Once | INTRAVENOUS | Status: AC | PRN
  Administered 2021-07-28: 8 mL via INTRAVENOUS

## 2021-07-31 ENCOUNTER — Other Ambulatory Visit: Payer: Self-pay

## 2021-07-31 ENCOUNTER — Ambulatory Visit (INDEPENDENT_AMBULATORY_CARE_PROVIDER_SITE_OTHER): Payer: Commercial Managed Care - PPO | Admitting: Infectious Diseases

## 2021-07-31 ENCOUNTER — Encounter: Payer: Self-pay | Admitting: Infectious Diseases

## 2021-07-31 ENCOUNTER — Telehealth: Payer: Self-pay

## 2021-07-31 VITALS — BP 130/87 | HR 77 | Temp 97.9°F

## 2021-07-31 DIAGNOSIS — Z5181 Encounter for therapeutic drug level monitoring: Secondary | ICD-10-CM | POA: Diagnosis not present

## 2021-07-31 DIAGNOSIS — G062 Extradural and subdural abscess, unspecified: Secondary | ICD-10-CM

## 2021-07-31 DIAGNOSIS — M4645 Discitis, unspecified, thoracolumbar region: Secondary | ICD-10-CM | POA: Insufficient documentation

## 2021-07-31 NOTE — Telephone Encounter (Signed)
Gave verbal orders to John Ware at [facility] per Dr. Elinor Parkinson to extend IV antibiotics until 12/28.   MRI scheduled for 12/23, follow up with Dr. Elinor Parkinson 12/28.  [name], RN

## 2021-07-31 NOTE — Telephone Encounter (Signed)
Thank you :)

## 2021-07-31 NOTE — Progress Notes (Addendum)
[facility]                                                             [place], [place], [place], [address]                                                                  Phn. 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 End date 08/22/21. Neurological exam of lower extremities stable Follow up with Neurosurgery  Follow up in 3 weeks with MRI L spine to be done prior to the appt Weekly CBC, CMP, vancomycin trough, CRP and ESR   All questions and concerns were discussed and addressed. Patient verbalized understanding of the plan. ____________________________________________________________________________________________________________________ Subjective/Interval Events 07/17/21 After last seen by me on 10/19, patient had a repeat MRI done for worsening LLE>> RLE weakness/elevated inflammatory markers with a fall. MRI 10/27 with no worsening findings. Daptomycin was switched to Vancomycin due to concerns for developing resistance with need for longer duration of tx. Patient was discharged from Inpatient rehab on 06/26/21.  He is accompanied by his wife. Getting IV Vancomycin and PO Rifampin without any issues like nausea, vomiting, rashes or diarrhea. Denies any pain/tenderness and swelling at the PICC site. He states  he has improvement in the strength of his lower extremities although he still has proximal rt leg weakness. He is able to lift his rt leg above gravity which he was not able to do previously. He needs assistance to walk. He is doing PT and is supposed to follow up with Neurosurgery. Back pain has significantly improved, some soreness+. Appetite is good and thinks he has gained some weight.   07/31/21 Here with his wife. Lower extremity weakness is stable. Getting IV Vancomycin from rt arm PICC and PO rifampin. Denies any issues with PICC line or IV vancomycin. He is able to stand without support briefly and walked briefly with assistance from PT yesterday. He feels stronger each day. Discussed MRI findings and plan to extend IV vancomycin until 12/28 and follow up MRI prior to end date. He has a  fu with Neurosurgery tomorrow.   ROS: Constitutional: Negative for fever, chills,  appetite change, fatigue and unexpected weight change.  Respiratory: Negative for cough, shortness of breath Cardiovascular: Negative for chest pain, palpitations and leg swelling.  Gastrointestinal: Negative for nausea, vomiting, abdominal pain, diarrhea/constipation, .  Genitourinary: Negative for dysuria, hematuria, flank pain Musculoskeletal: Negative for myalgias, arthralgia,  joint swelling, arthralgias Skin: Negative for rashes, lesions  Neurological: Negative for dizziness or headache. Left leg weakness  Past Medical History:  Diagnosis Date   Depression    Diabetes mellitus without complication ([place])    Hypertension    Spinal stenosis    With Neurogenic Claudication   Past Surgical History:  Procedure Laterality Date   CARPAL TUNNEL RELEASE Bilateral    INCISION AND DRAINAGE ABSCESS Left 03/12/2021   Procedure: INCISION AND DRAINAGE ABSCESS;  Surgeon: Marchia Bond, MD;  Location: [facility];  Service: Orthopedics;  Laterality: Left;   IR US GUIDE BX ASP/DRAIN  02/02/2021   IRRIGATION AND DEBRIDEMENT ABSCESS  Left 02/08/2021   Procedure: MINOR INCISION AND DRAINAGE OF ABSCESS;  Surgeon: Marchia Bond, MD;  [facility]: [facility];  Service: Orthopedics;  Laterality: Left;   IRRIGATION AND DEBRIDEMENT ABSCESS Left 02/10/2021   Procedure: IRRIGATION AND DEBRIDEMENT ABSCESS LEFT LATERAL CHEST WALL;  Surgeon: Greer Pickerel, MD;  Location[facility];  Service: General;  Laterality: Left;   IRRIGATION AND DEBRIDEMENT ABSCESS Bilateral 02/20/2021   Procedure: MINOR INCISION AND DRAINAGE OF ABSCESS/WITH BILATERAL WOUND VAC PLACEMENT;  Surgeon: Marchia Bond, MD;  [facility];  Service: Orthopedics;  Laterality: Bilateral;   IRRIGATION AND DEBRIDEMENT SHOULDER Bilateral 02/28/2021   Procedure: IRRIGATION AND DEBRIDEMENT SHOULDER;  Surgeon: Hiram Gash, MD;  [facility];  Service: Orthopedics;  Laterality: Bilateral;   IRRIGATION AND DEBRIDEMENT SHOULDER Left 03/06/2021   Procedure: IRRIGATION AND DEBRIDEMENT SHOULDER;  Surgeon: Renette Butters, MD;  [facility];  Service: Orthopedics;  Laterality: Left;   IRRIGATION AND DEBRIDEMENT SHOULDER Left 03/08/2021   Procedure: IRRIGATION AND DEBRIDEMENT SHOULDER;  Surgeon: Marchia Bond, MD;  [facility];  Service: Orthopedics;  Laterality: Left;   LUMBAR LAMINECTOMY/DECOMPRESSION MICRODISCECTOMY N/A 12/13/2014   Procedure: LUMBAR THREE-FOUR, LUMBAR FOUR-FIVE, LUMBAR FIVE-SACRAL ONE LAMINECTOMY;  Surgeon: Kristeen Miss, MD;  Location: [facility];  Service: Neurosurgery;  Laterality: N/A;  L3-4 L4-5 L5-S1 Laminectomy   LUMBAR LAMINECTOMY/DECOMPRESSION MICRODISCECTOMY Left 06/05/2021   Procedure: Left lumbar one-two, Lumbar two-three Laminectomy;  Surgeon: Kristeen Miss, MD;  Location: [facility];  Service: Neurosurgery;  Laterality: Left;   MENISCUS REPAIR     Right knee   MINOR APPLICATION OF WOUND VAC Left 02/20/2021   Procedure: MINOR APPLICATION OF KERLIX PACKING/REMOVAL OF WOUND VAC;  Surgeon: Johnathan Hausen, MD;  [facility];  Service: General;   Laterality: Left;   TEE WITHOUT CARDIOVERSION N/A 02/05/2021   Procedure: TRANSESOPHAGEAL ECHOCARDIOGRAM (TEE);  Surgeon: Sueanne Margarita, MD;  Location: [facility];  Service: Cardiovascular;  Laterality: N/A;   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 30 tablet 0  Baclofen 5 MG TABS TAKE ONE TABLET THREE TIMES DAILY 90 tablet 5   Continuous Blood Gluc Sensor (FREESTYLE LIBRE 2 SENSOR) MISC Needs to check BS QID 1 each 11   gabapentin (NEURONTIN) 100 MG capsule TAKE ONE CAPSULE TWICE DAILY 60 capsule 5   gabapentin (NEURONTIN) 800 MG tablet Take 1 tablet (800 mg total) by mouth at bedtime. 30 tablet 0   GNP VITAMIN C 500 MG tablet TAKE ONE TABLET TWICE DAILY 60 tablet 5   insulin glargine-yfgn (SEMGLEE, YFGN,) 100 UNIT/ML Pen Inject 8 Units into the skin daily. 15 mL 0   Insulin Pen Needle (PEN NEEDLES) 32G X 6 MM MISC 1 application by Does not apply route daily. 100 each 1   metFORMIN (GLUCOPHAGE-XR) 750 MG 24 hr tablet Take 1,500 mg by mouth daily with breakfast.     methocarbamol (ROBAXIN) 500 MG tablet TAKE TWO TABLETS THREE TIMES DAILY 180 tablet 5   morphine (MS CONTIN) 15 MG 12 hr tablet Take 3 tablets (45 mg total) by mouth every 12 (twelve) hours. 180 tablet 0   Multiple Vitamin (MULTIVITAMIN WITH MINERALS) TABS tablet Take 1 tablet by mouth daily.     naloxone (NARCAN) 0.4 MG/ML injection Inject 1 mL (0.4 mg total) into the vein as needed. 1 mL 0   oxyCODONE-acetaminophen (PERCOCET) 10-325 MG tablet Take 1 tablet by mouth every 6 (six) hours as needed  (breakthrough pain). 120 tablet 0   rifampin (RIFADIN) 300 MG capsule Take 1 capsule (300 mg total) by mouth every 12 (twelve) hours. 60 capsule 0   vancomycin IVPB Inject 1,250 mg into the vein every 12 (twelve) hours. Indication:  osteomylitis First Dose: No Last Day of Therapy:  07/31/2021 Labs - Sunday/Monday:  CBC/D, BMP, and vancomycin trough. Labs - Thursday:  BMP and vancomycin trough Labs - Every other week:  ESR and CRP Method of administration:Elastomeric Method of administration may be changed at the discretion of the patient and/or caregiver's ability to self-administer the medication ordered. 105 Units 0   zinc sulfate 220 (50 Zn) MG capsule TAKE ONE CAPSULE ONCE DAILY 30 capsule 5   No current facility-administered medications on file prior to visit.     Allergies  Allergen Reactions   Acetaminophen    Oxycodone Hcl     Other reaction(s): Trouble Urinating   Tizanidine Other (See Comments)    Leg cramps   Social History   Socioeconomic History   Marital status: Married    Spouse name: Not on file   Number of children: Not on file   Years of education: Not on file   Highest education level: Not on file  Occupational History   Not on file  Tobacco Use   Smoking status: Former    Packs/day: 2.00    Years: 15.00    Pack years: 30.00    Types: Cigarettes    Quit date: 2012    Years since quitting: 10.9   Smokeless tobacco: Never  Vaping Use   Vaping Use: Never used  Substance and Sexual Activity   Alcohol use: Yes    Comment: occasional   Drug use: No   Sexual activity: Not Currently  Other Topics Concern   Not on file  Social History Narrative   Not on file   Social Determinants of Health   Financial Resource Strain: Not on file  Food Insecurity: Not on file  Transportation Needs: Not on file  Physical Activity: Not on file  Stress: Not on file  Social Connections: Not on file  Intimate Partner Violence: Not on file   Family History  Problem  Relation Age of Onset   Cancer Mother    Colon cancer Neg Hx   ,  Vitals BP 130/87   Pulse 77   Temp 97.9 F (36.6 C) (Oral)   SpO2 98%    Examination  General - not in acute distress, comfortably sitting in chair HEENT - PEERLA, no pallor and no icterus Chest - b/l clear air entry, no additional sounds CVS- Normal s1s2, RRR Abdomen - Soft, Non tender , non distended Ext- no pedal edema Neuro: Left leg weakness( proximal 2-3/5>> distal 4/5). Rt leg proximal and distal 4+/5 Psych : calm and cooperative   Recent labs CBC Latest Ref Rng & Units 06/25/2021 06/21/2021 06/18/2021  WBC 4.0 - 10.5 K/uL 9.6 10.8(H) 9.8  Hemoglobin 13.0 - 17.0 g/dL 10.8(L) 10.9(L) 10.9(L)  Hematocrit 39.0 - 52.0 % 32.9(L) 34.0(L) 34.0(L)  Platelets 150 - 400 K/uL 451(H) 525(H) 502(H)   CMP Latest Ref Rng & Units 06/25/2021 06/18/2021 06/12/2021  Glucose 70 - 99 mg/dL 86 121(H) 116(H)  BUN 6 - 20 mg/dL '12 7 9  ' Creatinine 0.61 - 1.24 mg/dL 0.55(L) 0.48(L) 0.47(L)  Sodium 135 - 145 mmol/L 134(L) 136 134(L)  Potassium 3.5 - 5.1 mmol/L 4.1 4.1 4.0  Chloride 98 - 111 mmol/L 100 97(L) 98  CO2 22 - 32 mmol/L '28 29 27  ' Calcium 8.9 - 10.3 mg/dL 9.3 8.9 9.2  Total Protein 6.5 - 8.1 g/dL - - 7.2  Total Bilirubin 0.3 - 1.2 mg/dL - - 0.3  Alkaline Phos 38 - 126 U/L - - 80  AST 15 - 41 U/L - - 11(L)  ALT 0 - 44 U/L - - 10     Pertinent Microbiology Results for orders placed or performed during the hospital encounter of 05/28/21  Culture, blood (Routine X 2) w Reflex to ID Panel     Status: None   Collection Time: 05/28/21  5:20 PM   Specimen: BLOOD  Result Value Ref Range Status   Specimen Description   Final    BLOOD LEFT ANTECUBITAL Performed at [facility], [place] [address], [place], [place] [address]    Special Requests   Final    BOTTLES DRAWN AEROBIC AND ANAEROBIC Blood Culture adequate volume Performed at [facility] [address], [place], [place]  [address]    Culture   Final    NO GROWTH 5 DAYS Performed at [facility], [place] [address], [place], [place] [address]    Report Status 06/02/2021 FINAL  Final  Culture, blood (Routine X 2) w Reflex to ID Panel     Status: Abnormal   Collection Time: 05/28/21  5:22 PM   Specimen: BLOOD  Result Value Ref Range Status   Specimen Description   Final    BLOOD RIGHT ANTECUBITAL Performed at [facility] [address] [place], [place] [address]    Special Requests   Final    BOTTLES DRAWN AEROBIC AND ANAEROBIC Blood Culture results may not be optimal due to an excessive volume of blood received in culture bottles Performed at [facility] [address], [place], [place] [address]    Culture  Setup Time   Final    GRAM POSITIVE COCCI ANAEROBIC BOTTLE ONLY CRITICAL RESULT CALLED TO, READ BACK BY AND VERIFIED WITH: PHARMD GREG ABBOTT 05/31/2021'@2' :56 BY TW    Culture (  A)  Final    STAPHYLOCOCCUS AUREUS Sent to [facility] for further susceptibility testing. SEE SEPARATE REPORT MIC SUSCEPTIBILITY Performed at [facility] Performed at [facility], [address], [place], [place] [address]    Report Status 06/21/2021 FINAL  Final   Organism ID, Bacteria STAPHYLOCOCCUS AUREUS  Final      Susceptibility   Staphylococcus aureus - MIC*    RIFAMPIN Value in next row Sensitive      SENSITIVE0.5    * STAPHYLOCOCCUS AUREUS  Blood Culture ID Panel (Reflexed)     Status: Abnormal   Collection Time: 05/28/21  5:22 PM  Result Value Ref Range Status   Enterococcus faecalis NOT DETECTED NOT DETECTED Final   Enterococcus Faecium NOT DETECTED NOT DETECTED Final   Listeria monocytogenes NOT DETECTED NOT DETECTED Final   Staphylococcus species DETECTED (A) NOT DETECTED Final    Comment: CRITICAL RESULT CALLED TO, READ BACK BY AND VERIFIED WITH: PHARMD GREG ABBOTT 05/31/2021'@2' :56 BY TW    Staphylococcus aureus (BCID) DETECTED (A) NOT DETECTED  Final    Comment: CRITICAL RESULT CALLED TO, READ BACK BY AND VERIFIED WITH: PHARMD GREG ABBOTT 05/31/2021'@2' :56 BY TW    Staphylococcus epidermidis NOT DETECTED NOT DETECTED Final   Staphylococcus lugdunensis NOT DETECTED NOT DETECTED Final   Streptococcus species NOT DETECTED NOT DETECTED Final   Streptococcus agalactiae NOT DETECTED NOT DETECTED Final   Streptococcus pneumoniae NOT DETECTED NOT DETECTED Final   Streptococcus pyogenes NOT DETECTED NOT DETECTED Final   A.calcoaceticus-baumannii NOT DETECTED NOT DETECTED Final   Bacteroides fragilis NOT DETECTED NOT DETECTED Final   Enterobacterales NOT DETECTED NOT DETECTED Final   Enterobacter cloacae complex NOT DETECTED NOT DETECTED Final   Escherichia coli NOT DETECTED NOT DETECTED Final   Klebsiella aerogenes NOT DETECTED NOT DETECTED Final   Klebsiella oxytoca NOT DETECTED NOT DETECTED Final   Klebsiella pneumoniae NOT DETECTED NOT DETECTED Final   Proteus species NOT DETECTED NOT DETECTED Final   Salmonella species NOT DETECTED NOT DETECTED Final   Serratia marcescens NOT DETECTED NOT DETECTED Final   Haemophilus influenzae NOT DETECTED NOT DETECTED Final   Neisseria meningitidis NOT DETECTED NOT DETECTED Final   Pseudomonas aeruginosa NOT DETECTED NOT DETECTED Final   Stenotrophomonas maltophilia NOT DETECTED NOT DETECTED Final   Candida albicans NOT DETECTED NOT DETECTED Final   Candida auris NOT DETECTED NOT DETECTED Final   Candida glabrata NOT DETECTED NOT DETECTED Final   Candida krusei NOT DETECTED NOT DETECTED Final   Candida parapsilosis NOT DETECTED NOT DETECTED Final   Candida tropicalis NOT DETECTED NOT DETECTED Final   Cryptococcus neoformans/gattii NOT DETECTED NOT DETECTED Final   Meth resistant mecA/C and MREJ NOT DETECTED NOT DETECTED Final    Comment: Performed at [facility], [address], [place], [place] [address]  Susceptibility, Aer + Anaerob     Status: Abnormal   Collection Time: 05/28/21   5:22 PM  Result Value Ref Range Status   Suscept, Aer + Anaerob Final report (A)  Corrected    Comment: (NOTE) Performed At: [facility] [place], [place] [id number] Rush Farmer MD DX:[id number] CORRECTED ON 10/14 AT 2094: PREVIOUSLY REPORTED AS Preliminary report    Source of Sample 8,680  Final    Comment: S.AUREUS SUSCEPTIBILITY BLOOD CULTURE Performed at [facility], [place] [address], [place], [place] [address]   Susceptibility Result     Status: Abnormal   Collection Time: 05/28/21  5:22 PM  Result  Value Ref Range Status   Suscept Result 1 Staphylococcus aureus (A)  Final    Comment: (NOTE) Identification performed by account, not confirmed by this laboratory. Methicillin resistant (MRSA) Based on resistance to oxacillin this isolate would be resistant to all currently available beta-lactam antimicrobial agents, with the exception of the newer cephalosporins with anti-MRSA activity, such as Ceftaroline    Antimicrobial Suscept Comment  Corrected    Comment: (NOTE)      ** S = Susceptible; I = Intermediate; R = Resistant **                   P = Positive; N = Negative            MICS are expressed in micrograms per mL   Antibiotic                 RSLT#1    RSLT#2    RSLT#3    RSLT#4 Ciprofloxacin                  R Clindamycin                    S Erythromycin                   R Gentamicin                     S Levofloxacin                   I Linezolid                      S Oxacillin                      R Penicillin                     R Rifampin                       S Tetracycline                   S Trimethoprim/Sulfa             S Vancomycin                     S Performed At: [facility] [employer] [place], [place] [id number] Rush Farmer MD HX:[id number]   Resp Panel by RT-PCR (Flu A&B, Covid) Nasopharyngeal Swab     Status: None   Collection Time: 05/28/21  5:30 PM   Specimen: Nasopharyngeal Swab;  Nasopharyngeal(NP) swabs in vial transport medium  Result Value Ref Range Status   SARS Coronavirus 2 by RT PCR NEGATIVE NEGATIVE Final    Comment: (NOTE) SARS-CoV-2 target nucleic acids are NOT DETECTED.  The SARS-CoV-2 RNA is generally detectable in upper respiratory specimens during the acute phase of infection. The lowest concentration of SARS-CoV-2 viral copies this assay can detect is 138 copies/mL. A negative result does not preclude SARS-Cov-2 infection and should not be used as the sole basis for treatment or other patient management decisions. A negative result may occur with  improper specimen collection/handling, submission of specimen other than nasopharyngeal swab, presence of viral mutation(s) within the areas targeted by this assay, and inadequate number of viral copies(<138 copies/mL). A negative result must be combined with clinical observations, patient history, and epidemiological information. The  expected result is Negative.  Fact Sheet for Patients:  EntrepreneurPulse.com.au  Fact Sheet for Healthcare Providers:  IncredibleEmployment.be  This test is no t yet approved or cleared by the [place] FDA and  has been authorized for detection and/or diagnosis of SARS-CoV-2 by FDA under an Emergency Use Authorization (EUA). This EUA will remain  in effect (meaning this test can be used) for the duration of the COVID-19 declaration under Section 564(b)(1) of the Act, 21 U.S.C.section 360bbb-3(b)(1), unless the authorization is terminated  or revoked sooner.       Influenza A by PCR NEGATIVE NEGATIVE Final   Influenza B by PCR NEGATIVE NEGATIVE Final    Comment: (NOTE) The Xpert Xpress SARS-CoV-2/FLU/RSV plus assay is intended as an aid in the diagnosis of influenza from Nasopharyngeal swab specimens and should not be used as a sole basis for treatment. Nasal washings and aspirates are unacceptable for Xpert Xpress  SARS-CoV-2/FLU/RSV testing.  Fact Sheet for Patients: EntrepreneurPulse.com.au  Fact Sheet for Healthcare Providers: IncredibleEmployment.be  This test is not yet approved or cleared by the [place] FDA and has been authorized for detection and/or diagnosis of SARS-CoV-2 by FDA under an Emergency Use Authorization (EUA). This EUA will remain in effect (meaning this test can be used) for the duration of the COVID-19 declaration under Section 564(b)(1) of the Act, 21 U.S.C. section 360bbb-3(b)(1), unless the authorization is terminated or revoked.  Performed at [facility], [place] [address] [place], [place] [address]   Culture, blood (routine x 2)     Status: None   Collection Time: 06/01/21  6:02 AM   Specimen: BLOOD RIGHT HAND  Result Value Ref Range Status   Specimen Description BLOOD RIGHT HAND  Final   Special Requests   Final    AEROBIC BOTTLE ONLY Blood Culture results may not be optimal due to an inadequate volume of blood received in culture bottles   Culture   Final    NO GROWTH 5 DAYS Performed at [facility], [place] [address], [place], [place] [address]    Report Status 06/06/2021 FINAL  Final  Culture, blood (routine x 2)     Status: None   Collection Time: 06/01/21  6:02 AM   Specimen: BLOOD LEFT HAND  Result Value Ref Range Status   Specimen Description BLOOD LEFT HAND  Final   Special Requests   Final    AEROBIC BOTTLE ONLY Blood Culture results may not be optimal due to an inadequate volume of blood received in culture bottles   Culture   Final    NO GROWTH 5 DAYS Performed at [facility], [place] [address] [place], [place] [address]    Report Status 06/06/2021 FINAL  Final  Surgical pcr screen     Status: Abnormal   Collection Time: 06/05/21 11:17 AM   Specimen: Nasal Mucosa; Nasal Swab  Result Value Ref Range Status   MRSA, PCR NEGATIVE NEGATIVE Final   Staphylococcus aureus POSITIVE  (A) NEGATIVE Final    Comment: (NOTE) The Xpert SA Assay (FDA approved for NASAL specimens in patients 41 years of age and older), is one component of a comprehensive surveillance program. It is not intended to diagnose infection nor to guide or monitor treatment. Performed at [facility], [place] [address] [place], [place] [address]   Aerobic/Anaerobic Culture w Gram Stain (surgical/deep wound)     Status: None   Collection Time: 06/05/21  6:11 PM   Specimen: Soft  Tissue, Other  Result Value Ref Range Status   Specimen Description WOUND LUMBAR  Final   Special Requests LUMBAR DISC PT ON ANCEF  Final   Gram Stain   Final    FEW SQUAMOUS EPITHELIAL CELLS PRESENT MODERATE WBC SEEN NO ORGANISMS SEEN    Culture   Final    No growth aerobically or anaerobically. Performed at [facility], [place] [address], [place], [place] [address]    Report Status 06/10/2021 FINAL  Final     Pertinent Imaging All pertinent labs/Imagings/notes reviewed. All pertinent plain films and CT images have been personally visualized and interpreted; radiology reports have been reviewed. Decision making incorporated into the Impression / Recommendations.  MR Lumbar Spine W Wo Contrast  Result Date: 07/30/2021 CLINICAL DATA:  Epidural abscess Follow up on epidural phlegmon EXAM: MRI LUMBAR SPINE WITHOUT AND WITH CONTRAST TECHNIQUE: Multiplanar and multiecho pulse sequences of the lumbar spine were obtained without and with intravenous contrast. CONTRAST:  63m GADAVIST GADOBUTROL 1 MMOL/ML IV SOLN COMPARISON:  June 21, 2021. FINDINGS: Segmentation:  Standard.  Same numbering as on the prior MRI. Alignment:  Similar grade 1 anterolisthesis of L5 on S1. Vertebrae: Redemonstrated postsurgical changes of L3-S1 posterior fusion. Persistent enhancement of the L1-L2 and L2-L3 endplates, compatible with discitis. Mildly improved surrounding marrow edema, compatible with osteomyelitis. No definite progression of  vertebral body collapse. Ventral epidural phlegmon head similar craniocaudal extension final proximally L1 to L3-L4. The ventral epidural enhancing phlegmon is slightly thicker, measuring up to 6-7 mm in thickness (series 11, image 19), previously 5 mm. Conus medullaris and cauda equina: Conus extends to the superior L1 level. Conus appears normal. Paraspinal and other soft tissues: Increased edema and enhancement of the visualized left greater than right psoas musculature and posterior paraspinal musculature, compatible with worsening myositis. Elongated fluid collection within the right psoas muscle is collapsed. Small/collapse left psoas collection is similar. Similar presumed postoperative seroma in the posterior paraspinal soft tissues spanning L4-L5. Disc levels: T12-L1: Similar right paracentral disc protrusion without high-grade stenosis. No change. L1-L2: Left laminectomy. Similar versus slightly improved mild to moderate canal stenosis. Similar severe bilateral foraminal stenosis. L2-L3: Left laminectomy. Moderate canal stenosis, similar. Similar severe bilateral foraminal stenosis. L3-L4: Prior fusion and posterior decompression no evidence of significant residual stenosis. No change. L4-L5: Prior fusion and posterior decompression. No significant residual canal or foraminal stenosis. L5-S1: Similar grade 1 anterolisthesis. Prior fusion posterior decompression. No residual canal stenosis. Similar bilateral foraminal stenosis. IMPRESSION: 1. Persistent findings of active discitis/osteomyelitis at L1-L2 and L2-L3 with mildly improved marrow edema. No significant interval progression in the degree of endplate destruction/enhancement or vertebral body collapse. 2. Enhancing ventral epidural fluid/phlegmon at the L1-L2 levels is slightly increased in thickness, measuring up to 6-7 mm thickness at L2-L3, previously 5 mm. Moderate canal stenosis at L2-L3 is similar and mild-to-moderate canal stenosis at L1-L2 is  similar versus slightly improved. 3. Increased edema and enhancement of the left greater than right psoas and posterior paraspinal musculature, concerning for worsening myositis. Previously seen psoas muscle abscesses are collapsed. 4. Similar superimposed multilevel degenerative change and postoperative change. Electronically Signed   By: FMargaretha SheffieldM.D.   On: 07/30/2021 17:10       I have spent a total of 40 minutes of face-to-face and non-face-to-face time, excluding clinical staff time, preparing to see patient, ordering tests and/or medications, and provide counseling the patient   Electronically signed by:  SRosiland Oz MD Infectious Disease Physician [facility] [address] [place], [place] [address] Phone: [phone]  Fax: [phone]

## 2021-08-03 ENCOUNTER — Encounter
Payer: Commercial Managed Care - PPO | Attending: Physical Medicine and Rehabilitation | Admitting: Physical Medicine and Rehabilitation

## 2021-08-06 ENCOUNTER — Ambulatory Visit: Payer: Commercial Managed Care - PPO | Admitting: Gastroenterology

## 2021-08-07 ENCOUNTER — Other Ambulatory Visit: Payer: Self-pay

## 2021-08-07 MED ORDER — RIFAMPIN 300 MG PO CAPS: 300.0000 mg | ORAL_CAPSULE | Freq: Two times a day (BID) | ORAL | 0 refills | Status: DC

## 2021-08-17 ENCOUNTER — Ambulatory Visit (HOSPITAL_COMMUNITY)
Admission: RE | Admit: 2021-08-17 | Discharge: 2021-08-17 | Disposition: A | Discharge status: Home / Self Care | Payer: Commercial Managed Care - PPO | Source: Ambulatory Visit | Attending: Infectious Diseases | Admitting: Infectious Diseases

## 2021-08-17 ENCOUNTER — Other Ambulatory Visit: Payer: Self-pay

## 2021-08-17 DIAGNOSIS — G062 Extradural and subdural abscess, unspecified: Secondary | ICD-10-CM | POA: Diagnosis not present

## 2021-08-17 MED ORDER — GADOBUTROL 1 MMOL/ML IV SOLN
8.0000 mL | Freq: Once | INTRAVENOUS | Status: AC | PRN
  Administered 2021-08-17: 13:00:00 8 mL via INTRAVENOUS

## 2021-08-21 ENCOUNTER — Telehealth: Payer: Self-pay

## 2021-08-21 NOTE — Telephone Encounter (Signed)
-----   Message from Odette Fraction, MD sent at 08/21/2021  8:02 AM EST ----- Let him know that his MRI findings are stable and will discuss more about it in his appt tomorrow.

## 2021-08-21 NOTE — Progress Notes (Signed)
Let him know that his MRI findings are stable and will discuss more about it in his appt tomorrow.

## 2021-08-22 ENCOUNTER — Ambulatory Visit (INDEPENDENT_AMBULATORY_CARE_PROVIDER_SITE_OTHER): Payer: Commercial Managed Care - PPO | Admitting: Infectious Diseases

## 2021-08-22 ENCOUNTER — Other Ambulatory Visit: Payer: Self-pay

## 2021-08-22 VITALS — BP 142/85 | HR 98 | Resp 16 | Ht 70.0 in | Wt 168.2 lb

## 2021-08-22 DIAGNOSIS — Z5181 Encounter for therapeutic drug level monitoring: Secondary | ICD-10-CM | POA: Diagnosis not present

## 2021-08-22 DIAGNOSIS — M4645 Discitis, unspecified, thoracolumbar region: Secondary | ICD-10-CM | POA: Diagnosis not present

## 2021-08-22 DIAGNOSIS — Z452 Encounter for adjustment and management of vascular access device: Secondary | ICD-10-CM | POA: Insufficient documentation

## 2021-08-22 NOTE — Progress Notes (Addendum)
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   MRSA bacteremia 07/17/2021   MRSA infection    Acute blood loss anemia    Diabetic peripheral neuropathy (HCC)    Bacteremia    Postoperative pain    Septic discitis of lumbar region 06/11/2021   Infection of spine ([place]) 05/28/2021   Medication monitoring encounter 05/09/2021   MSSA (methicillin susceptible Staphylococcus aureus) infection 05/09/2021   Abscess    History of COVID-19    Psoas abscess ([place])    Psoas muscle abscess ([place]) 02/15/2021   Right shoulder pain 02/14/2021   Chest wall abscess 02/14/2021   Obesity 02/14/2021   Normocytic anemia 02/14/2021   Thrombocytosis 02/14/2021   Endocarditis 02/05/2021   Vision changes 02/04/2021   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   Pressure injury of skin 02/01/2021   HTN (hypertension) 02/01/2021   Anxiety 02/01/2021   Left shoulder pain 02/01/2021   Hyponatremia 01/31/2021   Positive colorectal cancer screening using Cologuard test 12/28/2020   DOE (dyspnea on exertion) 10/20/2020   Diabetic polyneuropathy associated with type 2 diabetes mellitus ([place]) 10/09/2020   Diabetes mellitus ([place]) 10/30/2018   Lumbar stenosis with neurogenic claudication 10/14/2017   DDD (degenerative disc disease), lumbar 02/17/2017   Transient weakness of right lower extremity 02/05/2017   Hyperlipidemia associated with type 2 diabetes mellitus ([place]) 10/24/2016   Depression, recurrent ([place]) 10/24/2016   Right knee pain 05/21/2016   DDD (degenerative disc disease), lumbosacral 05/21/2016   Overweight (BMI 25.0-29.9) 05/21/2016   Herniation of nucleus pulposus of cervical intervertebral disc without myelopathy 02/29/2016   Carpal tunnel syndrome 02/08/2016   Numbness of upper limb 01/17/2016   Cervical spondylosis with myelopathy 01/17/2016   Lumbar stenosis 12/13/2014    Patient's  Medications  New Prescriptions   No medications on file  Previous Medications   ACETAMINOPHEN (TYLENOL) 500 MG TABLET    Take 1 tablet (500 mg total) by mouth every 6 (six) hours as needed.   BACLOFEN 5 MG TABS    TAKE ONE TABLET THREE TIMES DAILY   CONTINUOUS BLOOD GLUC SENSOR (FREESTYLE LIBRE 2 SENSOR) MISC    Needs to check BS QID   GABAPENTIN (NEURONTIN) 100 MG CAPSULE    TAKE ONE CAPSULE TWICE DAILY   GABAPENTIN (NEURONTIN) 800 MG TABLET    Take 1 tablet (800 mg total) by mouth at bedtime.   GNP VITAMIN C 500 MG TABLET    TAKE ONE TABLET TWICE DAILY   INSULIN GLARGINE-YFGN (SEMGLEE, YFGN,) 100 UNIT/ML PEN    Inject 8 Units into the skin daily.   INSULIN PEN NEEDLE (PEN NEEDLES) 32G X 6 MM MISC    1 application by Does not apply route daily.   METFORMIN (GLUCOPHAGE-XR) 750 MG 24 HR TABLET    Take 1,500 mg by mouth daily with breakfast.   METHOCARBAMOL (ROBAXIN) 500 MG TABLET    TAKE TWO TABLETS THREE TIMES DAILY   MORPHINE (MS CONTIN) 15 MG 12 HR TABLET    Take 3 tablets (45 mg total) by mouth every 12 (twelve) hours.   MULTIPLE VITAMIN (MULTIVITAMIN WITH MINERALS) TABS TABLET    Take 1 tablet by mouth daily.   NALOXONE (NARCAN) 0.4 MG/ML INJECTION    Inject 1 mL (0.4 mg total) into the vein as needed.   OXYCODONE-ACETAMINOPHEN (PERCOCET) 10-325 MG TABLET    Take 1 tablet by  mouth every 6 (six) hours as needed (breakthrough pain).   RIFAMPIN (RIFADIN) 300 MG CAPSULE    Take 1 capsule (300 mg total) by mouth every 12 (twelve) hours.   VANCOMYCIN IVPB    Inject 1,250 mg into the vein every 12 (twelve) hours. Indication:  osteomylitis First Dose: No Last Day of Therapy:  07/31/2021 Labs - Sunday/Monday:  CBC/D, BMP, and vancomycin trough. Labs - Thursday:  BMP and vancomycin trough Labs - Every other week:  ESR and CRP Method of administration:Elastomeric Method of administration may be changed at the discretion of the patient and/or caregiver's ability to self-administer the medication  ordered.   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 Accompanied by his wife. Discussed with patient and wife about improved MRI findings and reasonable to transition to PO antibiotics from IV given he is clinically doing well in terms of his left leg weakness. Patient and wife both have concerns regarding switching to PO antibiotics given the recurrence of his infection in the past. Home health labs did not have inflammatory markers checked. Will have ESR and CRP to be added in next set of labs and faxed to me. Will plan to switch to PO antibiotics if inflammatory markers are reassuring.   He is getting IV Vancomycin through PICC and PO rifampin without any issues with PICC or antibiotics. He is unable to lift left thigh against gravity but able to lift left leg somewhat against gravity. He is able to stand without support briefly and says he was able to walk few steps with the help of cane. He is also working with PT and thinks his strength is improving. He overall feels improving.   Review of Systems: ROS 10 point ROS done with pertinent positives and negatives as listed above   Past Medical History:  Diagnosis Date   Depression    Diabetes mellitus without complication ([place])    Hypertension    Spinal stenosis    With Neurogenic Claudication   Past Surgical History:  Procedure Laterality Date   CARPAL TUNNEL RELEASE Bilateral    INCISION AND DRAINAGE ABSCESS Left 03/12/2021   Procedure: INCISION AND DRAINAGE ABSCESS;  Surgeon: Marchia Bond, MD;  Location: [facility];  Service: Orthopedics;  Laterality: Left;   IR US GUIDE BX ASP/DRAIN  02/02/2021   IRRIGATION AND DEBRIDEMENT ABSCESS Left 02/08/2021   Procedure: MINOR INCISION AND DRAINAGE OF ABSCESS;  Surgeon: Marchia Bond, MD;  Location: [facility];   Service: Orthopedics;  Laterality: Left;   IRRIGATION AND DEBRIDEMENT ABSCESS Left 02/10/2021   Procedure: IRRIGATION AND DEBRIDEMENT ABSCESS LEFT LATERAL CHEST WALL;  Surgeon: Greer Pickerel, MD;  Location: [facility];  Service: General;  Laterality: Left;   IRRIGATION AND DEBRIDEMENT ABSCESS Bilateral 02/20/2021   Procedure: MINOR INCISION AND DRAINAGE OF ABSCESS/WITH BILATERAL WOUND VAC PLACEMENT;  Surgeon: Marchia Bond, MD;  Location: [facility];  Service: Orthopedics;  Laterality: Bilateral;   IRRIGATION AND DEBRIDEMENT SHOULDER Bilateral 02/28/2021   Procedure: IRRIGATION AND DEBRIDEMENT SHOULDER;  Surgeon: Hiram Gash, MD;  Location: [facility];  Service: Orthopedics;  Laterality: Bilateral;   IRRIGATION AND DEBRIDEMENT SHOULDER Left 03/06/2021   Procedure: IRRIGATION AND DEBRIDEMENT SHOULDER;  Surgeon: Renette Butters, MD;  Location: [facility];  Service: Orthopedics;  Laterality: Left;   IRRIGATION AND  DEBRIDEMENT SHOULDER Left 03/08/2021   Procedure: IRRIGATION AND DEBRIDEMENT SHOULDER;  Surgeon: Marchia Bond, MD;  Location[facility];  Service: Orthopedics;  Laterality: Left;   LUMBAR LAMINECTOMY/DECOMPRESSION MICRODISCECTOMY N/A 12/13/2014   Procedure: LUMBAR THREE-FOUR, LUMBAR FOUR-FIVE, LUMBAR FIVE-SACRAL ONE LAMINECTOMY;  Surgeon: Kristeen Miss, MD;  Location: [facility];  Service: Neurosurgery;  Laterality: N/A;  L3-4 L4-5 L5-S1 Laminectomy   LUMBAR LAMINECTOMY/DECOMPRESSION MICRODISCECTOMY Left 06/05/2021   Procedure: Left lumbar one-two, Lumbar two-three Laminectomy;  Surgeon: Kristeen Miss, MD;  Location: [facility];  Service: Neurosurgery;  Laterality: Left;   MENISCUS REPAIR     Right knee   MINOR APPLICATION OF WOUND VAC Left 02/20/2021   Procedure: MINOR APPLICATION OF KERLIX PACKING/REMOVAL OF WOUND VAC;  Surgeon: Johnathan Hausen, MD;  Location: [facility];  Service: General;  Laterality: Left;   TEE WITHOUT CARDIOVERSION N/A 02/05/2021   Procedure: TRANSESOPHAGEAL ECHOCARDIOGRAM (TEE);  Surgeon:  Sueanne Margarita, MD;  Location: [facility];  Service: Cardiovascular;  Laterality: N/A;   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   TOTAL SHOULDER ARTHROPLASTY Right 02/10/2021   Procedure: RIGHT OPEN SHOULDER I & D, OPEN DISTAL CLAVICLE RESECTION;  Surgeon: Marchia Bond, MD;  Location: [facility];  Service: Orthopedics;  Laterality: Right;   VASECTOMY      Social History   Tobacco Use   Smoking status: Former    Packs/day: 2.00    Years: 15.00    Pack years: 30.00    Types: Cigarettes    Quit date: 2012    Years since quitting: 10.9   Smokeless tobacco: Never  Vaping Use   Vaping Use: Never used  Substance Use Topics   Alcohol use: Yes    Comment: occasional   Drug use: No    Family History  Problem Relation Age of Onset   Cancer Mother    Colon cancer Neg Hx     Allergies  Allergen Reactions   Acetaminophen    Oxycodone Hcl     Other reaction(s): Trouble Urinating   Tizanidine Other (See Comments)    Leg cramps    Health Maintenance  Topic Date Due   OPHTHALMOLOGY EXAM  Never done   Zoster Vaccines- Shingrix (1 of 2) Never done   COVID-19 Vaccine (3 - Booster for Moderna series) 03/13/2020   Pneumococcal Vaccine 10-35 Years old (2 - PCV) 06/23/2021   INFLUENZA VACCINE  11/23/2021 (Originally 03/26/2021)   FOOT EXAM  10/09/2021   HEMOGLOBIN A1C  11/26/2021   URINE MICROALBUMIN  04/23/2022   Fecal DNA (Cologuard)  11/03/2023   TETANUS/TDAP  08/14/2024   Hepatitis C Screening  Completed   HIV Screening  Completed   HPV VACCINES  Aged Out    Objective: BP (!) 142/85    Pulse 98    Resp 16    Ht '5\' 10"'  (1.778 m)    Wt 168 lb 3.4 oz (76.3 kg)    SpO2 98%    BMI 24.14 kg/m    Physical Exam Constitutional:      Appearance: Normal appearance.  HENT:     Head: Normocephalic and atraumatic.      Mouth: Mucous membranes are moist.  Eyes:    Conjunctiva/sclera: Conjunctivae normal.     Pupils: Pupils are equal, round, and reactive to light.    Cardiovascular:     Rate and Rhythm: Normal rate and regular rhythm.     Heart sounds: No murmur heard.   Pulmonary:  Effort: Pulmonary effort is normal.     Breath sounds: Normal breath sounds.   Abdominal:     General: Abdomen is flat.     Palpations: Abdomen is soft.   Musculoskeletal:        General: Normal range of motion.   Skin:    General: Skin is warm and dry.     Comments:  Neurological:     General: left leg weakness ( proximal 2-3/5>>distal 4/5) -stable/improving from last exam                    Rt leg proximal and distal power 4+/5 each     Mental Status: awake,  alert and oriented to person, place, and time.   Psychiatric:        Mood and Affect: Mood normal.   Lab Results Lab Results  Component Value Date   WBC 9.6 06/25/2021   HGB 10.8 (L) 06/25/2021   HCT 32.9 (L) 06/25/2021   MCV 81.6 06/25/2021   PLT 451 (H) 06/25/2021    Lab Results  Component Value Date   CREATININE 0.55 (L) 06/25/2021   BUN 12 06/25/2021   NA 134 (L) 06/25/2021   K 4.1 06/25/2021   CL 100 06/25/2021   CO2 28 06/25/2021    Lab Results  Component Value Date   ALT 10 06/12/2021   AST 11 (L) 06/12/2021   ALKPHOS 80 06/12/2021   BILITOT 0.3 06/12/2021    Lab Results  Component Value Date   CHOL 143 04/23/2021   HDL 35 (L) 04/23/2021   LDLCALC 87 04/23/2021   TRIG 112 04/23/2021   CHOLHDL 4.1 04/23/2021   No results found for: LABRPR, RPRTITER No results found for: HIV1RNAQUANT, HIV1RNAVL, CD4TABS   Problem List Items Addressed This Visit       Musculoskeletal and Integument   Discitis of thoracolumbar region - Primary     Other   Medication monitoring encounter   PICC (peripherally inserted central catheter) in place     Assessment/Plan # 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    # MRSA bacteremia   Prior h/o  disseminated MSSA infection  PsA Left shoulder septic arthritis    Medication Monitoring  12/27   Cr 0.48,  Vanc trough 12.5  Will get ESR and CRP with next set of labs with home health  Fu in a week with ESR and CRP for possible transition to PO antibiotics like Doxycyline indefinitely  Updated OPAT orders below Fu with PT/OT   Diagnosis: vertebral osteomyelitis   Culture Result: MRSA/MSSA  Allergies  Allergen Reactions   Acetaminophen    Oxycodone Hcl     Other reaction(s): Trouble Urinating   Tizanidine Other (See Comments)    Leg cramps    OPAT Orders Discharge antibiotics to be given via PICC line Discharge antibiotics: IV Vancomycin and PO Rifampin  Per pharmacy protocol  Aim for Vancomycin trough 15-20 or AUC 400-550 (unless otherwise indicated) Duration: 1 week extension from 12/28 End Date: 08/29/2021  [facility] Care Per Protocol:  Home health RN for IV administration and teaching; PICC line care and labs.    Labs weekly while on IV antibiotics: X__ CBC with differential __ BMP X__ CMP X__ CRP X__ ESR  X__ Vancomycin trough, twice weekly  __ CK  __ Please pull PIC at completion of IV antibiotics X__ Please leave PIC in place until doctor has seen patient or been notified  Fax weekly labs to [phone]  Clinic Follow Up Appt: 08/29/21  I have personally spent 40 minutes involved in face-to-face and non-face-to-face activities for this patient on the day of the visit. Professional time spent includes the following activities: Preparing to see the patient (review of tests), Obtaining and/or reviewing separately obtained history (admission/discharge record), Performing a medically appropriate examination and/or evaluation , Ordering medications/tests/procedures, referring and communicating with other health care professionals, Documenting clinical information in the EMR, Independently interpreting results (not separately reported), Communicating results to the patient/family/caregiver, Counseling and educating the patient/family/caregiver and Care coordination (not  separately reported).   Wilber Oliphant, [facility] 08/22/2021, 12:43 PM

## 2021-08-24 NOTE — Progress Notes (Signed)
[facility] team made aware of IV therapy extension until 08/29/20. Verbal orders given to Amy. Order read back and understood. Picc line to remain in place. Patient to follow up in the office on 08/29/20. Valarie Cones

## 2021-08-29 ENCOUNTER — Other Ambulatory Visit: Payer: Self-pay

## 2021-08-29 ENCOUNTER — Telehealth: Payer: Self-pay

## 2021-08-29 ENCOUNTER — Telehealth (INDEPENDENT_AMBULATORY_CARE_PROVIDER_SITE_OTHER): Payer: Commercial Managed Care - PPO | Admitting: Infectious Diseases

## 2021-08-29 ENCOUNTER — Encounter: Payer: Self-pay | Admitting: Infectious Diseases

## 2021-08-29 DIAGNOSIS — M60009 Infective myositis, unspecified site: Secondary | ICD-10-CM | POA: Diagnosis not present

## 2021-08-29 DIAGNOSIS — Z452 Encounter for adjustment and management of vascular access device: Secondary | ICD-10-CM

## 2021-08-29 DIAGNOSIS — M4645 Discitis, unspecified, thoracolumbar region: Secondary | ICD-10-CM | POA: Diagnosis not present

## 2021-08-29 DIAGNOSIS — Z5181 Encounter for therapeutic drug level monitoring: Secondary | ICD-10-CM | POA: Diagnosis not present

## 2021-08-29 NOTE — Progress Notes (Signed)
Virtual Visit via Telephone Note  I connected withNAME@ on 08/29/21 at  9:45 AM EST by a telephone enabled telemedicine application and verified that I am speaking with the correct person using two identifiers.  Location: Patient: Home  Provider: [facility]   I discussed the limitations of evaluation and management by telemedicine and the availability of in person appointments. The patient expressed understanding and agreed to proceed.  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   MRSA bacteremia 07/17/2021   MRSA infection    Acute blood loss anemia    Diabetic peripheral neuropathy (HCC)    Bacteremia    Postoperative pain    Septic discitis of lumbar region 06/11/2021   Infection of spine ([place]) 05/28/2021   Medication monitoring encounter 05/09/2021   MSSA (methicillin susceptible Staphylococcus aureus) infection 05/09/2021   Abscess    History of COVID-19    Psoas abscess ([place])    Psoas muscle abscess ([place]) 02/15/2021   Right shoulder pain 02/14/2021   Chest wall abscess 02/14/2021   Obesity 02/14/2021   Normocytic anemia 02/14/2021   Thrombocytosis 02/14/2021   Endocarditis 02/05/2021   Vision changes 02/04/2021   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   Pressure injury of skin 02/01/2021   HTN (hypertension) 02/01/2021   Anxiety 02/01/2021   Left shoulder pain 02/01/2021   Hyponatremia 01/31/2021   Positive colorectal cancer screening using Cologuard test 12/28/2020   DOE (dyspnea on exertion) 10/20/2020   Diabetic polyneuropathy associated with type 2 diabetes mellitus ([place]) 10/09/2020   Diabetes mellitus ([place]) 10/30/2018   Lumbar stenosis with neurogenic claudication 10/14/2017   DDD (degenerative disc disease), lumbar 02/17/2017   Transient weakness of right lower  extremity 02/05/2017   Hyperlipidemia associated with type 2 diabetes mellitus ([place]) 10/24/2016   Depression, recurrent ([place]) 10/24/2016   Right knee pain 05/21/2016   DDD (degenerative disc disease), lumbosacral 05/21/2016   Overweight (BMI 25.0-29.9) 05/21/2016   Herniation of nucleus pulposus of cervical intervertebral disc without myelopathy 02/29/2016   Carpal tunnel syndrome 02/08/2016   Numbness of upper limb 01/17/2016   Cervical spondylosis with myelopathy 01/17/2016   Lumbar stenosis 12/13/2014    Patient's Medications  New Prescriptions   No medications on file  Previous Medications   ACETAMINOPHEN (TYLENOL) 500 MG TABLET    Take 1 tablet (500 mg total) by mouth every 6 (six) hours as needed.   BACLOFEN 5 MG TABS    TAKE ONE TABLET THREE TIMES DAILY   CONTINUOUS BLOOD GLUC SENSOR (FREESTYLE LIBRE 2 SENSOR) MISC    Needs to check BS QID   GABAPENTIN (NEURONTIN) 100 MG CAPSULE    TAKE ONE CAPSULE TWICE DAILY   GABAPENTIN (NEURONTIN) 800 MG TABLET    Take 1 tablet (800 mg total) by mouth at bedtime.   GNP VITAMIN C 500 MG TABLET    TAKE ONE TABLET TWICE DAILY   INSULIN GLARGINE-YFGN (SEMGLEE, YFGN,) 100 UNIT/ML PEN    Inject 8 Units into the skin daily.   INSULIN PEN NEEDLE (PEN NEEDLES) 32G X 6 MM MISC    1 application by Does not apply route daily.   METFORMIN (GLUCOPHAGE-XR) 750 MG 24 HR TABLET    Take 1,500 mg by mouth daily with breakfast.   METHOCARBAMOL (ROBAXIN) 500 MG TABLET    TAKE TWO TABLETS THREE TIMES DAILY  MIRTAZAPINE (REMERON) 15 MG TABLET    Take 15 mg by mouth at bedtime.   MORPHINE (MS CONTIN) 15 MG 12 HR TABLET    Take 3 tablets (45 mg total) by mouth every 12 (twelve) hours.   MULTIPLE VITAMIN (MULTIVITAMIN WITH MINERALS) TABS TABLET    Take 1 tablet by mouth daily.   NALOXONE (NARCAN) 0.4 MG/ML INJECTION    Inject 1 mL (0.4 mg total) into the vein as needed.   OXYCODONE-ACETAMINOPHEN (PERCOCET) 10-325 MG TABLET    Take 1 tablet by mouth every 6 (six)  hours as needed (breakthrough pain).   RIFAMPIN (RIFADIN) 300 MG CAPSULE    Take 1 capsule (300 mg total) by mouth every 12 (twelve) hours.   VANCOMYCIN IVPB    Inject 1,250 mg into the vein every 12 (twelve) hours. Indication:  osteomylitis First Dose: No Last Day of Therapy:  07/31/2021 Labs - Sunday/Monday:  CBC/D, BMP, and vancomycin trough. Labs - Thursday:  BMP and vancomycin trough Labs - Every other week:  ESR and CRP Method of administration:Elastomeric Method of administration may be changed at the discretion of the patient and/or caregiver's ability to self-administer the medication ordered.   ZINC SULFATE 220 (50 ZN) MG CAPSULE    TAKE ONE CAPSULE ONCE DAILY  Modified Medications   No medications on file  Discontinued Medications   No medications on file    History of Present Illness: Here for follow up for vertebral discitis and osteomyelitis. He has been doing well since last visit. He tells me he walked his driveway for the first time yesterday. He is working with Physical therapy and that is going well. Receiving IV Vancomycin through PICC and PO rifampin. Denies fevers, chills and sweats. Denies nausea, vomiting and diarrhea. Discussed lab results with him and that his CRP is elevated to 40. His wife is very concerned about his switch to PO antibiotics at this time given his multiple recurrence and wants to make sure his inflammatory markers are down trending before the switch which is reasonable given his complicated history with MSSA and MRSA. I will extend antibiotics for 2 more weeks and re-evaluate in 2 weeks for possible switch to PO antibiotics.   ROS negative for fevers, chills and diarrhea All other systems reviewed and negative as above  Past Medical History:  Diagnosis Date   Depression    Diabetes mellitus without complication ([place])    Hypertension    Spinal stenosis    With Neurogenic Claudication    Social History   Tobacco Use   Smoking status: Former     Packs/day: 2.00    Years: 15.00    Pack years: 30.00    Types: Cigarettes    Quit date: 2012    Years since quitting: 11.0   Smokeless tobacco: Never  Vaping Use   Vaping Use: Never used  Substance Use Topics   Alcohol use: Yes    Comment: occasional   Drug use: No    Family History  Problem Relation Age of Onset   Cancer Mother    Colon cancer Neg Hx     Allergies  Allergen Reactions   Acetaminophen    Oxycodone Hcl     Other reaction(s): Trouble Urinating   Tizanidine Other (See Comments)    Leg cramps    Health Maintenance  Topic Date Due   OPHTHALMOLOGY EXAM  Never done   Zoster Vaccines- Shingrix (1 of 2) Never done   COVID-19 Vaccine (3 - Booster  for Moderna series) 03/13/2020   Pneumococcal Vaccine 25-35 Years old (2 - PCV) 06/23/2021   INFLUENZA VACCINE  11/23/2021 (Originally 03/26/2021)   FOOT EXAM  10/09/2021   HEMOGLOBIN A1C  11/26/2021   URINE MICROALBUMIN  04/23/2022   Fecal DNA (Cologuard)  11/03/2023   TETANUS/TDAP  08/14/2024   Hepatitis C Screening  Completed   HIV Screening  Completed   HPV VACCINES  Aged Out    Observations/Objective:  Problem List Items Addressed This Visit       Musculoskeletal and Integument   Discitis of thoracolumbar region - Primary   Infective myositis     Other   Medication monitoring encounter   PICC (peripherally inserted central catheter) in place     Assessment and Plan: # 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 OPAT orders updated   Weekly,  CBC, CMP, vancomycin trough, ESR and CRP. Vancomycin trough goal 15-20 Fu in 2 weeks. Antibiotics to be continued until next appointment  Opat orders  Diagnosis: Osteomyelitis   Culture Result: MRSA  Allergies  Allergen Reactions   Acetaminophen    Oxycodone Hcl     Other reaction(s): Trouble Urinating   Tizanidine Other (See Comments)    Leg cramps    OPAT Orders Discharge antibiotics to be given via PICC line Discharge antibiotics: IV vancomycin/PO Rifampin  Per pharmacy protocol  Aim for Vancomycin trough 15-20 or AUC 400-550 (unless otherwise indicated) Duration: 2 weeks  End Date: until my next appt in 2 weeks   PIC Care Per Protocol:  Home health RN for IV administration and teaching; PICC line care and labs.    Labs weekly while on IV antibiotics: _X_ CBC with differential __ BMP _X_ CMP _X_ CRP _X_ ESR _X_ Vancomycin trough __ CK  __ Please pull PIC at completion of IV antibiotics _X_ Please leave PIC in place until doctor has seen patient or been notified  Fax weekly labs to [phone]  I discussed the assessment and treatment plan with the patient. The patient was provided an opportunity to ask questions and all were answered. The patient agreed with the plan and demonstrated an understanding of the instructions.   The patient was advised to call back or seek an in-person evaluation if the symptoms worsen or if the condition fails to improve as anticipated.  I provided  20 minutes of non-face-to-face time during this encounter.  Wilber Oliphant, [facility] Phone [phone] Fax no. [phone]  08/29/2021, 9:47 AM

## 2021-08-29 NOTE — Telephone Encounter (Signed)
Informed Amy at [facility] of verbal orders per Dr.Manandhar to extend IV vancomycin until 09/12/2021. Amy repeated verbal orders back to me and verbalized her understanding.    Contacted [facility] to inform them of extension of verbal orders to extend IV abx until 09/12/2021 and to fax weekly CBC, CMA, Vancomycin trough, ESR and CRP to our office. Physician order form with orders faxed to [facility]. \    John Ware, CMA

## 2021-09-03 ENCOUNTER — Other Ambulatory Visit: Payer: Self-pay

## 2021-09-03 MED ORDER — RIFAMPIN 300 MG PO CAPS: 300.0000 mg | ORAL_CAPSULE | Freq: Two times a day (BID) | ORAL | 0 refills | Status: DC

## 2021-09-05 NOTE — Telephone Encounter (Signed)
Will close encounter

## 2021-09-10 DIAGNOSIS — G4700 Insomnia, unspecified: Secondary | ICD-10-CM

## 2021-09-10 DIAGNOSIS — Z87891 Personal history of nicotine dependence: Secondary | ICD-10-CM

## 2021-09-10 DIAGNOSIS — Z792 Long term (current) use of antibiotics: Secondary | ICD-10-CM

## 2021-09-10 DIAGNOSIS — D509 Iron deficiency anemia, unspecified: Secondary | ICD-10-CM

## 2021-09-10 DIAGNOSIS — E1142 Type 2 diabetes mellitus with diabetic polyneuropathy: Secondary | ICD-10-CM

## 2021-09-10 DIAGNOSIS — Z7984 Long term (current) use of oral hypoglycemic drugs: Secondary | ICD-10-CM

## 2021-09-10 DIAGNOSIS — G061 Intraspinal abscess and granuloma: Secondary | ICD-10-CM

## 2021-09-10 DIAGNOSIS — M4317 Spondylolisthesis, lumbosacral region: Secondary | ICD-10-CM

## 2021-09-10 DIAGNOSIS — M4646 Discitis, unspecified, lumbar region: Secondary | ICD-10-CM

## 2021-09-10 DIAGNOSIS — I10 Essential (primary) hypertension: Secondary | ICD-10-CM

## 2021-09-10 DIAGNOSIS — M4626 Osteomyelitis of vertebra, lumbar region: Secondary | ICD-10-CM

## 2021-09-10 DIAGNOSIS — M48062 Spinal stenosis, lumbar region with neurogenic claudication: Secondary | ICD-10-CM

## 2021-09-10 DIAGNOSIS — Z9181 History of falling: Secondary | ICD-10-CM

## 2021-09-10 DIAGNOSIS — F32A Depression, unspecified: Secondary | ICD-10-CM

## 2021-09-10 DIAGNOSIS — Z794 Long term (current) use of insulin: Secondary | ICD-10-CM

## 2021-09-10 DIAGNOSIS — Z452 Encounter for adjustment and management of vascular access device: Secondary | ICD-10-CM

## 2021-09-10 DIAGNOSIS — M5416 Radiculopathy, lumbar region: Secondary | ICD-10-CM

## 2021-09-10 DIAGNOSIS — D62 Acute posthemorrhagic anemia: Secondary | ICD-10-CM

## 2021-09-12 ENCOUNTER — Ambulatory Visit: Payer: Commercial Managed Care - PPO | Admitting: Infectious Diseases

## 2021-09-13 ENCOUNTER — Telehealth: Payer: Self-pay

## 2021-09-13 ENCOUNTER — Other Ambulatory Visit: Payer: Self-pay

## 2021-09-13 ENCOUNTER — Encounter: Payer: Self-pay | Admitting: Infectious Diseases

## 2021-09-13 ENCOUNTER — Ambulatory Visit (INDEPENDENT_AMBULATORY_CARE_PROVIDER_SITE_OTHER): Payer: Commercial Managed Care - PPO | Admitting: Infectious Diseases

## 2021-09-13 VITALS — BP 148/89 | HR 75 | Temp 98.2°F | Wt 185.0 lb

## 2021-09-13 DIAGNOSIS — Z5181 Encounter for therapeutic drug level monitoring: Secondary | ICD-10-CM

## 2021-09-13 DIAGNOSIS — Z452 Encounter for adjustment and management of vascular access device: Secondary | ICD-10-CM

## 2021-09-13 DIAGNOSIS — M4645 Discitis, unspecified, thoracolumbar region: Secondary | ICD-10-CM | POA: Diagnosis not present

## 2021-09-13 MED ORDER — DOXYCYCLINE HYCLATE 100 MG PO TABS: 100.0000 mg | ORAL_TABLET | Freq: Two times a day (BID) | ORAL | 11 refills | Status: DC

## 2021-09-13 NOTE — Progress Notes (Signed)
Patient Active Problem List   Diagnosis Date Noted   Infective myositis 08/29/2021   PICC (peripherally inserted central catheter) in place 08/22/2021   Discitis of thoracolumbar region 07/31/2021   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   MRSA bacteremia 07/17/2021   MRSA infection    Acute blood loss anemia    Diabetic peripheral neuropathy ([place])    Bacteremia    Postoperative pain    Septic discitis of lumbar region 06/11/2021   Infection of spine ([place]) 05/28/2021   Medication monitoring encounter 05/09/2021   MSSA (methicillin susceptible Staphylococcus aureus) infection 05/09/2021   Abscess    History of COVID-19    Psoas abscess ([place])    Psoas muscle abscess ([place]) 02/15/2021   Right shoulder pain 02/14/2021   Chest wall abscess 02/14/2021   Obesity 02/14/2021   Normocytic anemia 02/14/2021   Thrombocytosis 02/14/2021   Endocarditis 02/05/2021   Vision changes 02/04/2021   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/03/2021   Pressure injury of skin 02/01/2021   HTN (hypertension) 02/01/2021   Anxiety 02/01/2021   Left shoulder pain 02/01/2021   Hyponatremia 01/31/2021   Positive colorectal cancer screening using Cologuard test 12/28/2020   DOE (dyspnea on exertion) 10/20/2020   Diabetic polyneuropathy associated with type 2 diabetes mellitus ([place]) 10/09/2020   Diabetes mellitus ([place]) 10/30/2018   Lumbar stenosis with neurogenic claudication 10/14/2017   DDD (degenerative disc disease), lumbar 02/17/2017   Transient weakness of right lower extremity 02/05/2017   Hyperlipidemia associated with type 2 diabetes mellitus ([place]) 10/24/2016   Depression, recurrent ([place]) 10/24/2016   Right knee pain 05/21/2016   DDD (degenerative disc disease), lumbosacral 05/21/2016   Overweight (BMI 25.0-29.9) 05/21/2016   Herniation of nucleus pulposus of cervical intervertebral disc without myelopathy 02/29/2016   Carpal tunnel syndrome 02/08/2016   Numbness of upper limb 01/17/2016    Cervical spondylosis with myelopathy 01/17/2016   Lumbar stenosis 12/13/2014    Patient's Medications  New Prescriptions   No medications on file  Previous Medications   ACETAMINOPHEN (TYLENOL) 500 MG TABLET    Take 1 tablet (500 mg total) by mouth every 6 (six) hours as needed.   BACLOFEN 5 MG TABS    TAKE ONE TABLET THREE TIMES DAILY   CONTINUOUS BLOOD GLUC SENSOR (FREESTYLE LIBRE 2 SENSOR) MISC    Needs to check BS QID   GABAPENTIN (NEURONTIN) 100 MG CAPSULE    TAKE ONE CAPSULE TWICE DAILY   GABAPENTIN (NEURONTIN) 800 MG TABLET    Take 1 tablet (800 mg total) by mouth at bedtime.   GNP VITAMIN C 500 MG TABLET    TAKE ONE TABLET TWICE DAILY   INSULIN GLARGINE-YFGN (SEMGLEE, YFGN,) 100 UNIT/ML PEN    Inject 8 Units into the skin daily.   INSULIN PEN NEEDLE (PEN NEEDLES) 32G X 6 MM MISC    1 application by Does not apply route daily.   METFORMIN (GLUCOPHAGE-XR) 750 MG 24 HR TABLET    Take 1,500 mg by mouth daily with breakfast.   METHOCARBAMOL (ROBAXIN) 500 MG TABLET    TAKE TWO TABLETS THREE TIMES DAILY   MIRTAZAPINE (REMERON) 15 MG TABLET    Take 15 mg by mouth at bedtime.   MORPHINE (MS CONTIN) 15 MG 12 HR TABLET    Take 3 tablets (45 mg total) by mouth every 12 (twelve) hours.   MULTIPLE VITAMIN (MULTIVITAMIN WITH MINERALS) TABS TABLET    Take 1 tablet by mouth daily.   NALOXONE (  NARCAN) 0.4 MG/ML INJECTION    Inject 1 mL (0.4 mg total) into the vein as needed.   OXYCODONE-ACETAMINOPHEN (PERCOCET) 10-325 MG TABLET    Take 1 tablet by mouth every 6 (six) hours as needed (breakthrough pain).   RIFAMPIN (RIFADIN) 300 MG CAPSULE    Take 1 capsule (300 mg total) by mouth every 12 (twelve) hours.   VANCOMYCIN IVPB    Inject 1,250 mg into the vein every 12 (twelve) hours. Indication:  osteomylitis First Dose: No Last Day of Therapy:  07/31/2021 Labs - Sunday/Monday:  CBC/D, BMP, and vancomycin trough. Labs - Thursday:  BMP and vancomycin trough Labs - Every other week:  ESR and CRP Method  of administration:Elastomeric Method of administration may be changed at the discretion of the patient and/or caregiver's ability to self-administer the medication ordered.   ZINC SULFATE 220 (50 ZN) MG CAPSULE    TAKE ONE CAPSULE ONCE DAILY  Modified Medications   No medications on file  Discontinued Medications   No medications on file    Subjective: Here for follow up in the setting of vertebral discitis and osteomyelitis. Accompanied by wife.  Feels getting better and stronger. He is working with Physical therapy and now able to walk with the help of walker.  He still has numbness in the left anterior thigh. Denies any issues with PICC. Getting IV vancomycin and PO rifampin without any issues. Discussed about switch from IV abtx to PO doxycyline for PO suppression for long term given his complicated h/o infection. Discussed potential side effects of doxycycline   Review of Systems: ROS negative for fever chills and sweats Negative for nausea vomiting and diarrhea All other systems reviewed and are negative  Past Medical History:  Diagnosis Date   Depression    Diabetes mellitus without complication ([place])    Hypertension    Spinal stenosis    With Neurogenic Claudication    Social History   Tobacco Use   Smoking status: Former    Packs/day: 2.00    Years: 15.00    Pack years: 30.00    Types: Cigarettes    Quit date: 2012    Years since quitting: 11.0   Smokeless tobacco: Never  Vaping Use   Vaping Use: Never used  Substance Use Topics   Alcohol use: Yes    Comment: occasional   Drug use: No    Family History  Problem Relation Age of Onset   Cancer Mother    Colon cancer Neg Hx     Allergies  Allergen Reactions   Acetaminophen    Oxycodone Hcl     Other reaction(s): Trouble Urinating   Tizanidine Other (See Comments)    Leg cramps    Health Maintenance  Topic Date Due   OPHTHALMOLOGY EXAM  Never done   Zoster Vaccines- Shingrix (1 of 2) Never done    COVID-19 Vaccine (3 - Booster for Moderna series) 03/13/2020   INFLUENZA VACCINE  11/23/2021 (Originally 03/26/2021)   FOOT EXAM  10/09/2021   HEMOGLOBIN A1C  11/26/2021   URINE MICROALBUMIN  04/23/2022   Fecal DNA (Cologuard)  11/03/2023   TETANUS/TDAP  08/14/2024   Hepatitis C Screening  Completed   HIV Screening  Completed   HPV VACCINES  Aged Out    Objective: BP (!) 148/89    Pulse 75    Temp 98.2 F (36.8 C) (Temporal)    Wt 185 lb (83.9 kg)    SpO2 95%    BMI 26.54 kg/m  Physical Exam Constitutional:      Appearance: Normal appearance. Has a walker HENT:     Head: Normocephalic and atraumatic.      Mouth: Mucous membranes are moist.  Eyes:    Conjunctiva/sclera: Conjunctivae normal.     Pupils: Pupils are equal, round  Cardiovascular:     Rate and Rhythm: Normal rate and regular rhythm.     Heart sounds:   Pulmonary:     Effort: Pulmonary effort is normal.     Breath sounds:  Abdominal:     General: Abdomen is flat.     Palpations: Abdomen is soft.   Musculoskeletal:        General:  Able to walk with the support of walker Power 4/5 in Rt proximal and distal lower extremity, 3/5 in left upper extremity and 4/5 in left lower extremity  He is able to lift his left leg from bed against gravity which he was not able to do before.   Skin:    General: Skin is warm and dry.     Comments:  Neurological:     General: left lower extremity weakness as above     Mental Status: awake, alert and oriented to person, place, and time.   Psychiatric:        Mood and Affect: Mood normal.   Lab Results Lab Results  Component Value Date   WBC 9.6 06/25/2021   HGB 10.8 (L) 06/25/2021   HCT 32.9 (L) 06/25/2021   MCV 81.6 06/25/2021   PLT 451 (H) 06/25/2021    Lab Results  Component Value Date   CREATININE 0.55 (L) 06/25/2021   BUN 12 06/25/2021   NA 134 (L) 06/25/2021   K 4.1 06/25/2021   CL 100 06/25/2021   CO2 28 06/25/2021    Lab Results  Component Value  Date   ALT 10 06/12/2021   AST 11 (L) 06/12/2021   ALKPHOS 80 06/12/2021   BILITOT 0.3 06/12/2021    Lab Results  Component Value Date   CHOL 143 04/23/2021   HDL 35 (L) 04/23/2021   LDLCALC 87 04/23/2021   TRIG 112 04/23/2021   CHOLHDL 4.1 04/23/2021   No results found for: LABRPR, RPRTITER No results found for: HIV1RNAQUANT, HIV1RNAVL, CD4TABS  Problem List Items Addressed This Visit       Musculoskeletal and Integument   Discitis of thoracolumbar region - Primary     Other   Medication monitoring encounter   PICC (peripherally inserted central catheter) in place   Assessment/Plan # 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 ESR and CRP although still elevated, these are non specific given patient continues to clinically improve.   # PICC in place   # Prior h/o  disseminated MSSA infection  PsA Left shoulder septic arthritis   DC PICC line, IV Vancomycin and PO rifampin Start Doxycycline 160m PO BID to cover both MSSA and MRSA - plan to continue indefinitely  Fu in 6 weeks  Fu PT and OT All questions and concerns discussed  I have personally spent 42  minutes involved in face-to-face and non-face-to-face activities for this patient on the day of the visit including counseling of the patient, discussing laboratory values as well as treatment plan and future monitoring  SWilber Oliphant MD [facility] 09/13/2021, 10:52 AM

## 2021-09-13 NOTE — Telephone Encounter (Signed)
Per MD called [facility] with verbal order to have patient's picc pulled today. Spoke with Jeani Hawking, Pharmacist who will relay orders to home health [facility], [place]

## 2021-09-13 NOTE — Telephone Encounter (Signed)
Thank you :)

## 2021-10-17 ENCOUNTER — Ambulatory Visit (INDEPENDENT_AMBULATORY_CARE_PROVIDER_SITE_OTHER): Payer: Commercial Managed Care - PPO | Admitting: Infectious Diseases

## 2021-10-17 ENCOUNTER — Other Ambulatory Visit: Payer: Self-pay

## 2021-10-17 VITALS — BP 121/84 | HR 96 | Resp 16 | Ht 70.0 in | Wt 194.0 lb

## 2021-10-17 DIAGNOSIS — M4645 Discitis, unspecified, thoracolumbar region: Secondary | ICD-10-CM | POA: Diagnosis not present

## 2021-10-17 DIAGNOSIS — Z969 Presence of functional implant, unspecified: Secondary | ICD-10-CM

## 2021-10-17 DIAGNOSIS — E1169 Type 2 diabetes mellitus with other specified complication: Secondary | ICD-10-CM

## 2021-10-17 DIAGNOSIS — Z5181 Encounter for therapeutic drug level monitoring: Secondary | ICD-10-CM | POA: Diagnosis not present

## 2021-10-17 NOTE — Progress Notes (Signed)
Patient Active Problem List   Diagnosis Date Noted   Infective myositis 08/29/2021   PICC (peripherally inserted central catheter) in place 08/22/2021   Discitis of thoracolumbar region 07/31/2021   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   MRSA bacteremia 07/17/2021   MRSA infection    Acute blood loss anemia    Diabetic peripheral neuropathy ([place])    Bacteremia    Postoperative pain    Septic discitis of lumbar region 06/11/2021   Infection of spine ([place]) 05/28/2021   Medication monitoring encounter 05/09/2021   MSSA (methicillin susceptible Staphylococcus aureus) infection 05/09/2021   Abscess    History of COVID-19    Psoas abscess ([place])    Psoas muscle abscess ([place]) 02/15/2021   Right shoulder pain 02/14/2021   Chest wall abscess 02/14/2021   Obesity 02/14/2021   Normocytic anemia 02/14/2021   Thrombocytosis 02/14/2021   Endocarditis 02/05/2021   Vision changes 02/04/2021   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   Pressure injury of skin 02/01/2021   HTN (hypertension) 02/01/2021   Anxiety 02/01/2021   Left shoulder pain 02/01/2021   Hyponatremia 01/31/2021   Positive colorectal cancer screening using Cologuard test 12/28/2020   DOE (dyspnea on exertion) 10/20/2020   Diabetic polyneuropathy associated with type 2 diabetes mellitus ([place]) 10/09/2020   Diabetes mellitus ([place]) 10/30/2018   Lumbar stenosis with neurogenic claudication 10/14/2017   DDD (degenerative disc disease), lumbar 02/17/2017   Transient weakness of right lower extremity 02/05/2017   Hyperlipidemia associated with type 2 diabetes mellitus ([place]) 10/24/2016   Depression, recurrent ([place]) 10/24/2016   Right knee pain 05/21/2016   DDD (degenerative disc disease), lumbosacral 05/21/2016   Overweight (BMI 25.0-29.9) 05/21/2016   Herniation of nucleus pulposus of cervical intervertebral disc without myelopathy 02/29/2016   Carpal tunnel syndrome 02/08/2016   Numbness of upper limb 01/17/2016   Cervical  spondylosis with myelopathy 01/17/2016   Lumbar stenosis 12/13/2014   Current Outpatient Medications on File Prior to Visit  Medication Sig Dispense Refill   doxycycline (VIBRA-TABS) 100 MG tablet Take 1 tablet (100 mg total) by mouth 2 (two) times daily. 60 tablet 11   mirtazapine (REMERON) 15 MG tablet Take 15 mg by mouth at bedtime.     morphine (MS CONTIN) 15 MG 12 hr tablet Take 3 tablets (45 mg total) by mouth every 12 (twelve) hours. 180 tablet 0   Multiple Vitamin (MULTIVITAMIN WITH MINERALS) TABS tablet Take 1 tablet by mouth daily.     naloxone (NARCAN) 0.4 MG/ML injection Inject 1 mL (0.4 mg total) into the vein as needed. 1 mL 0   zinc sulfate 220 (50 Zn) MG capsule TAKE ONE CAPSULE ONCE DAILY 30 capsule 5   acetaminophen (TYLENOL) 500 MG tablet Take 1 tablet (500 mg total) by mouth every 6 (six) hours as needed. 30 tablet 0   Baclofen 5 MG TABS TAKE ONE TABLET THREE TIMES DAILY 90 tablet 5   Continuous Blood Gluc Sensor (FREESTYLE LIBRE 2 SENSOR) MISC Needs to check BS QID 1 each 11   gabapentin (NEURONTIN) 100 MG capsule TAKE ONE CAPSULE TWICE DAILY 60 capsule 5   GNP VITAMIN C 500 MG tablet TAKE ONE TABLET TWICE DAILY 60 tablet 5   oxyCODONE-acetaminophen (PERCOCET) 10-325 MG tablet Take 1 tablet by mouth every 6 (six) hours as needed (breakthrough pain). 120 tablet 0   No current facility-administered medications on file prior to visit.      Subjective: Here for follow up in the setting of  vertebral discitis and osteomyelitis. Accompanied by wife. Has been taking doxycycline twice daily. Denies any side effects like photosensitivity, reflux, nausea, vomiting, diarrhea and rashes. Wife had noticed a small pimple at the midline lower surgical site area which she tells squeezed and it has healed now with a scab. He has been able to walk with the help of cane for the last 2 weeks and very happy about the progress. Felt rt sided lower back pain last Saturday 4/10, no radiation,  aggravating and relieving factors. No new numbness and weakness. Denies fevers, chills. He however mentions poor sleep posture and thinks possible association. He has also stopped taking his medications like robaxin, insulin and metformin. Appetite is good and has gained 12 kgs since end of Dec 2022.  Review of Systems: ROS All other systems reviewed and are negative  Past Medical History:  Diagnosis Date   Depression    Diabetes mellitus without complication ([place])    Hypertension    Spinal stenosis    With Neurogenic Claudication   Past Surgical History:  Procedure Laterality Date   CARPAL TUNNEL RELEASE Bilateral    INCISION AND DRAINAGE ABSCESS Left 03/12/2021   Procedure: INCISION AND DRAINAGE ABSCESS;  Surgeon: Marchia Bond, MD;  Location: [facility];  Service: Orthopedics;  Laterality: Left;   IR US GUIDE BX ASP/DRAIN  02/02/2021   IRRIGATION AND DEBRIDEMENT ABSCESS Left 02/08/2021   Procedure: MINOR INCISION AND DRAINAGE OF ABSCESS;  Surgeon: Marchia Bond, MD;  Location: [facility];  Service: Orthopedics;  Laterality: Left;   IRRIGATION AND DEBRIDEMENT ABSCESS Left 02/10/2021   Procedure: IRRIGATION AND DEBRIDEMENT ABSCESS LEFT LATERAL CHEST WALL;  Surgeon: Greer Pickerel, MD;  Location: [facility];  Service: General;  Laterality: Left;   IRRIGATION AND DEBRIDEMENT ABSCESS Bilateral 02/20/2021   Procedure: MINOR INCISION AND DRAINAGE OF ABSCESS/WITH BILATERAL WOUND VAC PLACEMENT;  Surgeon: Marchia Bond, MD;  Location: [facility];  Service: Orthopedics;  Laterality: Bilateral;   IRRIGATION AND DEBRIDEMENT SHOULDER Bilateral 02/28/2021   Procedure: IRRIGATION AND DEBRIDEMENT SHOULDER;  Surgeon: Hiram Gash, MD;  Location: [facility];  Service: Orthopedics;  Laterality: Bilateral;   IRRIGATION AND DEBRIDEMENT SHOULDER Left 03/06/2021   Procedure: IRRIGATION AND DEBRIDEMENT SHOULDER;  Surgeon: Renette Butters, MD;  Location: [facility];  Service: Orthopedics;  Laterality: Left;   IRRIGATION AND DEBRIDEMENT  SHOULDER Left 03/08/2021   Procedure: IRRIGATION AND DEBRIDEMENT SHOULDER;  Surgeon: Marchia Bond, MD;  Location: [facility];  Service: Orthopedics;  Laterality: Left;   LUMBAR LAMINECTOMY/DECOMPRESSION MICRODISCECTOMY N/A 12/13/2014   Procedure: LUMBAR THREE-FOUR, LUMBAR FOUR-FIVE, LUMBAR FIVE-SACRAL ONE LAMINECTOMY;  Surgeon: Kristeen Miss, MD;  Location: [facility];  Service: Neurosurgery;  Laterality: N/A;  L3-4 L4-5 L5-S1 Laminectomy   LUMBAR LAMINECTOMY/DECOMPRESSION MICRODISCECTOMY Left 06/05/2021   Procedure: Left lumbar one-two, Lumbar two-three Laminectomy;  Surgeon: Kristeen Miss, MD;  Location: [facility];  Service: Neurosurgery;  Laterality: Left;   MENISCUS REPAIR     Right knee   MINOR APPLICATION OF WOUND VAC Left 02/20/2021   Procedure: MINOR APPLICATION OF KERLIX PACKING/REMOVAL OF WOUND VAC;  Surgeon: Johnathan Hausen, MD;  Location: [facility];  Service: General;  Laterality: Left;   TEE WITHOUT CARDIOVERSION N/A 02/05/2021   Procedure: TRANSESOPHAGEAL ECHOCARDIOGRAM (TEE);  Surgeon: Sueanne Margarita, MD;  Location[facility];  Service: Cardiovascular;  Laterality: N/A;   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   TOTAL SHOULDER ARTHROPLASTY Right 02/10/2021   Procedure: RIGHT OPEN SHOULDER I & D, OPEN  DISTAL CLAVICLE RESECTION;  Surgeon: Marchia Bond, MD;  Location: [facility];  Service: Orthopedics;  Laterality: Right;   VASECTOMY      Social History   Tobacco Use   Smoking status: Former    Packs/day: 2.00    Years: 15.00    Pack years: 30.00    Types: Cigarettes    Quit date: 2012    Years since quitting: 11.1   Smokeless tobacco: Never  Vaping Use   Vaping Use: Never used  Substance Use Topics   Alcohol use: Yes    Comment: occasional   Drug use: No    Family History  Problem Relation Age of Onset   Cancer Mother    Colon cancer Neg Hx     Allergies  Allergen Reactions   Acetaminophen    Oxycodone Hcl     Other reaction(s): Trouble Urinating    Tizanidine Other (See Comments)    Leg cramps    Health Maintenance  Topic Date Due   OPHTHALMOLOGY EXAM  Never done   Zoster Vaccines- Shingrix (1 of 2) Never done   COVID-19 Vaccine (3 - Booster for Moderna series) 03/13/2020   FOOT EXAM  10/09/2021   INFLUENZA VACCINE  11/23/2021 (Originally 03/26/2021)   HEMOGLOBIN A1C  11/26/2021   URINE MICROALBUMIN  04/23/2022   Fecal DNA (Cologuard)  11/03/2023   TETANUS/TDAP  08/14/2024   Hepatitis C Screening  Completed   HIV Screening  Completed   HPV VACCINES  Aged Out    Objective: BP 121/84    Pulse 96    Resp 16    Ht 5\' 10"  (1.778 m)    Wt 194 lb (88 kg)    SpO2 96%    BMI 27.84 kg/m     Physical Exam Constitutional:      Appearance: Normal appearance. Has a cane, ambulatory HENT:     Head: Normocephalic and atraumatic.      Mouth: Mucous membranes are moist.  Eyes:    Conjunctiva/sclera: Conjunctivae normal.     Pupils: Pupils are equal, round  Cardiovascular:     Rate and Rhythm: Normal rate and regular rhythm.     Heart sounds:   Pulmonary:     Effort: Pulmonary effort is normal.     Breath sounds:  Abdominal:     General: Abdomen is flat.     Palpations: Abdomen is soft.   Musculoskeletal:        General:  Able to walk with cane Power 4+/5 in Rt proximal and distal lower extremity, 3 to 4/5 in left upper extremity and 4/5 in left lower extremity  Back - no wounds, sores, well healed surgical site   Skin:    General: Skin is warm and dry.     Comments:  Neurological:     General: left lower extremity weakness as above     Mental Status: awake, alert and oriented to person, place, and time.   Psychiatric:        Mood and Affect: Mood normal.   Lab Results Lab Results  Component Value Date   WBC 9.6 06/25/2021   HGB 10.8 (L) 06/25/2021   HCT 32.9 (L) 06/25/2021   MCV 81.6 06/25/2021   PLT 451 (H) 06/25/2021    Lab Results  Component Value Date   CREATININE 0.55 (L) 06/25/2021   BUN 12  06/25/2021   NA 134 (L) 06/25/2021   K 4.1 06/25/2021   CL 100 06/25/2021   CO2 28 06/25/2021  Lab Results  Component Value Date   ALT 10 06/12/2021   AST 11 (L) 06/12/2021   ALKPHOS 80 06/12/2021   BILITOT 0.3 06/12/2021    Lab Results  Component Value Date   CHOL 143 04/23/2021   HDL 35 (L) 04/23/2021   LDLCALC 87 04/23/2021   TRIG 112 04/23/2021   CHOLHDL 4.1 04/23/2021   No results found for: LABRPR, RPRTITER No results found for: HIV1RNAQUANT, HIV1RNAVL, CD4TABS  Problem List Items Addressed This Visit       Endocrine   Diabetes mellitus ([place])     Musculoskeletal and Integument   Discitis of thoracolumbar region     Other   Medication monitoring encounter - Primary   Relevant Orders   Comprehensive metabolic panel   C-reactive protein   Sedimentation rate    Assessment/Plan # 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 # MRSA Bacteremia # Medication Monitoring -    # Prior h/o  disseminated MSSA infection  PsA Left shoulder septic arthritis   Continue doxycycline 100mg  PO BID indefinitely  Labs today  Fu with PT and OT Fu with PCP for DM management, he has stopped taking insulin and metformin Fu in 3 months   I have personally spent 40  minutes involved in face-to-face and non-face-to-face activities for this patient on the day of the visit including counseling of the patient, discussing laboratory values as well as treatment plan and future monitoring  Wilber Oliphant, [place] [facility] 10/17/2021, 3:43 PM

## 2021-10-18 LAB — COMPREHENSIVE METABOLIC PANEL
AG Ratio: 1.3 (calc) (ref 1.0–2.5)
ALT: 11 U/L (ref 9–46)
AST: 11 U/L (ref 10–35)
Albumin: 4.4 g/dL (ref 3.6–5.1)
Alkaline phosphatase (APISO): 102 U/L (ref 35–144)
BUN/Creatinine Ratio: 21 (calc) (ref 6–22)
BUN: 12 mg/dL (ref 7–25)
CO2: 31 mmol/L (ref 20–32)
Calcium: 10.1 mg/dL (ref 8.6–10.3)
Chloride: 101 mmol/L (ref 98–110)
Creat: 0.57 mg/dL — ABNORMAL LOW (ref 0.70–1.30)
Globulin: 3.4 g/dL (calc) (ref 1.9–3.7)
Glucose, Bld: 140 mg/dL — ABNORMAL HIGH (ref 65–99)
Potassium: 4.2 mmol/L (ref 3.5–5.3)
Sodium: 139 mmol/L (ref 135–146)
Total Bilirubin: 0.5 mg/dL (ref 0.2–1.2)
Total Protein: 7.8 g/dL (ref 6.1–8.1)

## 2021-10-18 LAB — C-REACTIVE PROTEIN: CRP: 111.5 mg/L — ABNORMAL HIGH (ref <8.0)

## 2021-10-18 LAB — SEDIMENTATION RATE: Sed Rate: 53 mm/h — ABNORMAL HIGH (ref 0–20)

## 2021-10-18 NOTE — Progress Notes (Signed)
Please let him know that CRP has trended up- this is non specific and does not necessarily mean infection has come back. But monitor for fevers, chills or signs of infection It seems he has stopped taking many of your medicines for DM and recommend to fu with PCP for its management.

## 2021-10-22 ENCOUNTER — Encounter: Payer: Self-pay | Admitting: Infectious Diseases

## 2021-11-03 ENCOUNTER — Other Ambulatory Visit: Payer: Self-pay | Admitting: Family

## 2021-11-03 DIAGNOSIS — E1169 Type 2 diabetes mellitus with other specified complication: Secondary | ICD-10-CM

## 2021-11-08 ENCOUNTER — Other Ambulatory Visit: Payer: Commercial Managed Care - PPO

## 2021-11-08 ENCOUNTER — Other Ambulatory Visit: Payer: Self-pay | Admitting: Family Medicine

## 2021-11-08 LAB — BAYER DCA HB A1C WAIVED: HB A1C (BAYER DCA - WAIVED): 7.1 % — ABNORMAL HIGH (ref 4.8–5.6)

## 2021-11-09 LAB — CMP14+EGFR
ALT: 13 IU/L (ref 0–44)
AST: 13 IU/L (ref 0–40)
Albumin/Globulin Ratio: 1.3 (ref 1.2–2.2)
Albumin: 4.3 g/dL (ref 3.8–4.9)
Alkaline Phosphatase: 109 IU/L (ref 44–121)
BUN/Creatinine Ratio: 12 (ref 9–20)
BUN: 10 mg/dL (ref 6–24)
Bilirubin Total: 0.6 mg/dL (ref 0.0–1.2)
CO2: 26 mmol/L (ref 20–29)
Calcium: 10 mg/dL (ref 8.7–10.2)
Chloride: 95 mmol/L — ABNORMAL LOW (ref 96–106)
Creatinine, Ser: 0.86 mg/dL (ref 0.76–1.27)
Globulin, Total: 3.2 g/dL (ref 1.5–4.5)
Glucose: 136 mg/dL — ABNORMAL HIGH (ref 70–99)
Potassium: 4.5 mmol/L (ref 3.5–5.2)
Sodium: 138 mmol/L (ref 134–144)
Total Protein: 7.5 g/dL (ref 6.0–8.5)
eGFR: 103 mL/min/{1.73_m2} (ref >59)

## 2021-11-09 LAB — CBC WITH DIFFERENTIAL/PLATELET
Basophils Absolute: 0.1 10*3/uL (ref 0.0–0.2)
Basos: 1 %
EOS (ABSOLUTE): 0.2 10*3/uL (ref 0.0–0.4)
Eos: 2 %
Hematocrit: 39.9 % (ref 37.5–51.0)
Hemoglobin: 13.5 g/dL (ref 13.0–17.7)
Immature Grans (Abs): 0 10*3/uL (ref 0.0–0.1)
Immature Granulocytes: 0 %
Lymphocytes Absolute: 2.1 10*3/uL (ref 0.7–3.1)
Lymphs: 25 %
MCH: 28.4 pg (ref 26.6–33.0)
MCHC: 33.8 g/dL (ref 31.5–35.7)
MCV: 84 fL (ref 79–97)
Monocytes Absolute: 1.1 10*3/uL — ABNORMAL HIGH (ref 0.1–0.9)
Monocytes: 13 %
Neutrophils Absolute: 5.2 10*3/uL (ref 1.4–7.0)
Neutrophils: 59 %
Platelets: 364 10*3/uL (ref 150–450)
RBC: 4.75 x10E6/uL (ref 4.14–5.80)
RDW: 13.5 % (ref 11.6–15.4)
WBC: 8.6 10*3/uL (ref 3.4–10.8)

## 2021-11-09 LAB — LIPID PANEL
Chol/HDL Ratio: 3.4 ratio (ref 0.0–5.0)
Cholesterol, Total: 146 mg/dL (ref 100–199)
HDL: 43 mg/dL (ref >39)
LDL Chol Calc (NIH): 85 mg/dL (ref 0–99)
Triglycerides: 97 mg/dL (ref 0–149)
VLDL Cholesterol Cal: 18 mg/dL (ref 5–40)

## 2021-11-16 ENCOUNTER — Encounter: Payer: Self-pay | Admitting: Family

## 2021-11-16 ENCOUNTER — Ambulatory Visit (INDEPENDENT_AMBULATORY_CARE_PROVIDER_SITE_OTHER): Payer: Commercial Managed Care - PPO | Admitting: Family

## 2021-11-16 VITALS — BP 124/84 | HR 70 | Temp 98.4°F | Ht 70.0 in | Wt 181.2 lb

## 2021-11-16 DIAGNOSIS — E785 Hyperlipidemia, unspecified: Secondary | ICD-10-CM

## 2021-11-16 DIAGNOSIS — E663 Overweight: Secondary | ICD-10-CM

## 2021-11-16 DIAGNOSIS — E1169 Type 2 diabetes mellitus with other specified complication: Secondary | ICD-10-CM

## 2021-11-16 DIAGNOSIS — E1142 Type 2 diabetes mellitus with diabetic polyneuropathy: Secondary | ICD-10-CM

## 2021-11-16 DIAGNOSIS — I10 Essential (primary) hypertension: Secondary | ICD-10-CM

## 2021-11-16 DIAGNOSIS — G8929 Other chronic pain: Secondary | ICD-10-CM

## 2021-11-16 DIAGNOSIS — F419 Anxiety disorder, unspecified: Secondary | ICD-10-CM

## 2021-11-16 DIAGNOSIS — R531 Weakness: Secondary | ICD-10-CM

## 2021-11-16 DIAGNOSIS — F339 Major depressive disorder, recurrent, unspecified: Secondary | ICD-10-CM

## 2021-11-16 DIAGNOSIS — M549 Dorsalgia, unspecified: Secondary | ICD-10-CM

## 2021-11-16 LAB — HM DIABETES EYE EXAM

## 2021-11-16 NOTE — Patient Instructions (Signed)

## 2021-11-16 NOTE — Progress Notes (Signed)
? ?Subjective:  ? ? Patient ID: John Ware, male    DOB: 1966/05/14, 56 y.o.   MRN: [id number] ? ?Chief Complaint  ?Patient presents with  ? Medical Management of Chronic Issues  ? ?PT presents to the office today for chronic follow up. He is followed by Pain Clinic every month for chronic back pain. Has hx of lumbar stenosis.  ?  ?He is followed by ID for  psoas muscle abscess. He is followed by Ortho.  ?Diabetes ?He presents for his follow-up diabetic visit. He has type 2 diabetes mellitus. Hypoglycemia symptoms include nervousness/anxiousness. Associated symptoms include blurred vision, fatigue and foot paresthesias. Symptoms are stable. Diabetic complications include heart disease and peripheral neuropathy. Risk factors for coronary artery disease include dyslipidemia, diabetes mellitus, male sex, hypertension and sedentary lifestyle. He is following a generally healthy diet. His overall blood glucose range is 130-140 mg/dl. An ACE inhibitor/angiotensin II receptor blocker is being taken. Eye exam is not current.  ?Hypertension ?This is a chronic problem. The current episode started more than 1 year ago. The problem has been resolved since onset. The problem is controlled. Associated symptoms include anxiety, blurred vision and malaise/fatigue. Pertinent negatives include no peripheral edema or shortness of breath. Risk factors for coronary artery disease include dyslipidemia, diabetes mellitus and male gender. The current treatment provides moderate improvement.  ?Hyperlipidemia ?This is a chronic problem. The current episode started more than 1 year ago. The problem is controlled. Pertinent negatives include no shortness of breath. The current treatment provides moderate improvement of lipids. Risk factors for coronary artery disease include dyslipidemia, male sex, hypertension and a sedentary lifestyle.  ?Depression ?       This is a chronic problem.  The current episode started more than 1 year ago.    The problem occurs intermittently.  Associated symptoms include fatigue, helplessness, hopelessness, irritable, restlessness and sad.  Past medical history includes anxiety.   ?Anxiety ?Presents for follow-up visit. Symptoms include depressed mood, excessive worry, irritability, nervous/anxious behavior and restlessness. Patient reports no obsessions or shortness of breath. Symptoms occur occasionally. The severity of symptoms is moderate.  ? ? ? ? ? ?Review of Systems  ?Constitutional:  Positive for fatigue, irritability and malaise/fatigue.  ?Eyes:  Positive for blurred vision.  ?Respiratory:  Negative for shortness of breath.   ?Psychiatric/Behavioral:  Positive for depression. The patient is nervous/anxious.   ?All other systems reviewed and are negative. ? ?   ?Objective:  ? Physical Exam ?Vitals reviewed.  ?Constitutional:   ?   General: He is irritable. He is not in acute distress. ?   Appearance: He is well-developed.  ?HENT:  ?   Head: Normocephalic.  ?   Right Ear: External ear normal.  ?   Left Ear: External ear normal.  ?Eyes:  ?   General:     ?   Right eye: No discharge.     ?   Left eye: No discharge.  ?   Pupils: Pupils are equal, round, and reactive to light.  ?Neck:  ?   Thyroid: No thyromegaly.  ?Cardiovascular:  ?   Rate and Rhythm: Normal rate and regular rhythm.  ?   Heart sounds: Normal heart sounds. No murmur heard. ?Pulmonary:  ?   Effort: Pulmonary effort is normal. No respiratory distress.  ?   Breath sounds: Normal breath sounds. No wheezing.  ?Abdominal:  ?   General: Bowel sounds are normal. There is no distension.  ?   Palpations: Abdomen  is soft.  ?   Tenderness: There is no abdominal tenderness.  ?Musculoskeletal:     ?   General: No tenderness.  ?   Cervical back: Normal range of motion and neck supple.  ?   Comments: Pain in lumbar with flexion and extension  ?Skin: ?   General: Skin is warm and dry.  ?   Findings: No erythema or rash.  ?Neurological:  ?   Mental Status: He is  alert and oriented to person, place, and time.  ?   Cranial Nerves: No cranial nerve deficit.  ?   Motor: Weakness present.  ?   Deep Tendon Reflexes: Reflexes are normal and symmetric.  ?Psychiatric:     ?   Behavior: Behavior normal.     ?   Thought Content: Thought content normal.     ?   Judgment: Judgment normal.  ? ? ? ? ?BP 124/84   Pulse 70   Temp 98.4 ?F (36.9 ?C) (Temporal)   Ht 5\' 10"  (1.778 m)   Wt 181 lb 3.2 oz (82.2 kg)   BMI 26.00 kg/m?  ? ?   ?Assessment & Plan:  ?John Ware comes in today with chief complaint of Medical Management of Chronic Issues ? ? ?Diagnosis and orders addressed: ? ?1. Primary hypertension ? ?2. Type 2 diabetes mellitus with other specified complication, without long-term current use of insulin (HCC) ? ?3. Diabetic polyneuropathy associated with type 2 diabetes mellitus (HCC) ? ?4. Hyperlipidemia associated with type 2 diabetes mellitus (HCC) ? ?5. Anxiety ? ?6. Depression, recurrent (HCC) ? ?7. Overweight (BMI 25.0-29.9) ? ?8. Generalized weakness ?- Ambulatory referral to Physical Therapy ? ?9. Chronic back pain, unspecified back location, unspecified back pain laterality ?- Ambulatory referral to Physical Therapy ? ? ?Labs pending ?Health Maintenance reviewed ?Diet and exercise encouraged ? ?Follow up plan: ?3 months ? ? ?06-28-2001, FNP ? ? ? ?

## 2021-11-28 ENCOUNTER — Other Ambulatory Visit: Payer: Self-pay | Admitting: Family

## 2021-11-28 DIAGNOSIS — E1169 Type 2 diabetes mellitus with other specified complication: Secondary | ICD-10-CM

## 2022-01-09 IMAGING — MR MR LUMBAR SPINE WO/W CM
6 of 7 series · 30 of 48 positions shown · IV contrast (gadavist)
Comparison: June 21, 2021.

CLINICAL DATA: Epidural abscess Follow up on epidural phlegmon

EXAM:
MRI LUMBAR SPINE WITHOUT AND WITH CONTRAST
TECHNIQUE: Multiplanar and multiecho pulse sequences of the lumbar spine were
obtained without and with intravenous contrast.
CONTRAST:  8mL GADAVIST GADOBUTROL 1 MMOL/ML IV SOLN

[Series 5: T1 · sagittal · 4.0mm · 0.81mm/px · 5 of 17 slices shown (1 of 2)]
[im 1/17]
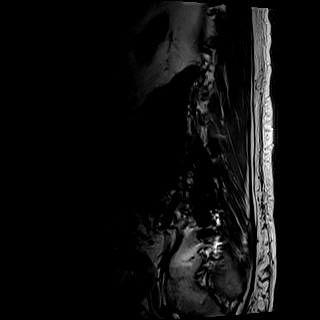
[im 5/17]
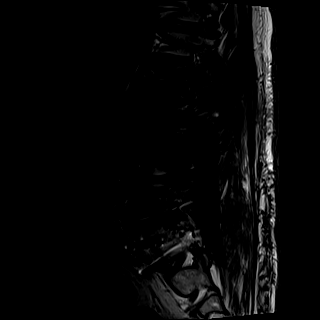
[im 9/17]
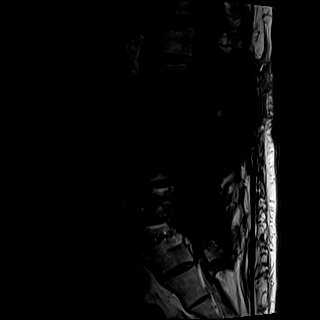
[im 13/17]
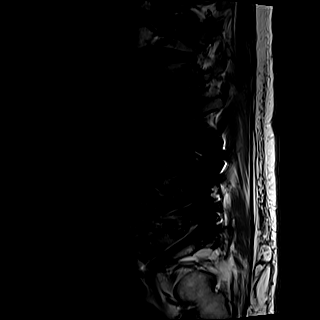
[im 17/17]
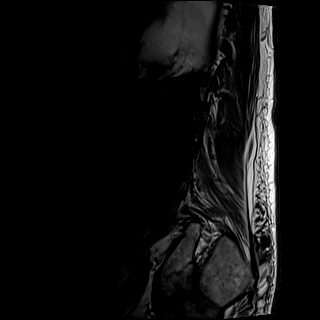

[Series 6: STIR · sagittal · 4.0mm · 0.51mm/px · 1 of 17 slices shown]
[im 1/17]
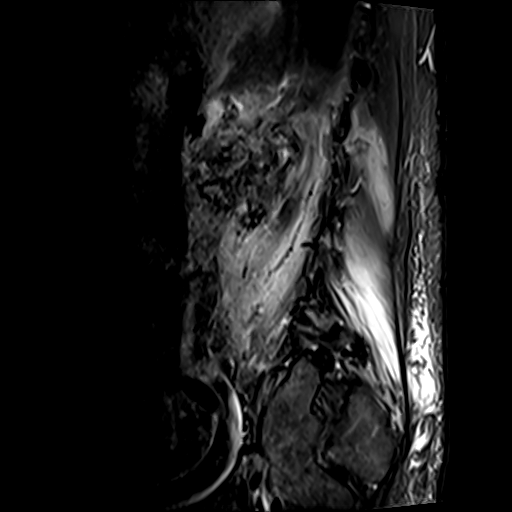

[Series 7: T2 · axial · 4.0mm · 0.62mm/px · z∈[-116,+95]mm · 8 of 43 slices shown]
[im 1/43]
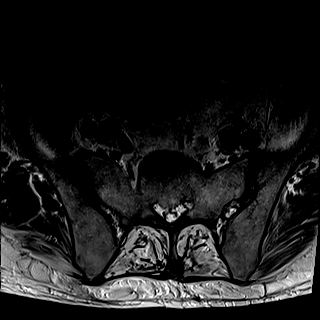
[im 5/43]
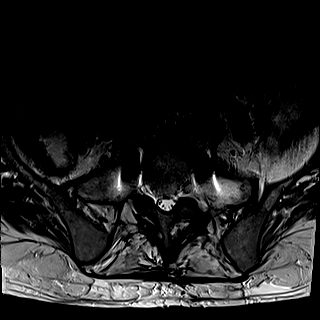
[im 15/43]
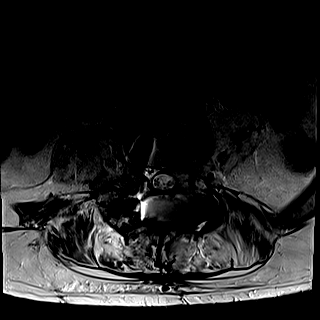
[im 19/43]
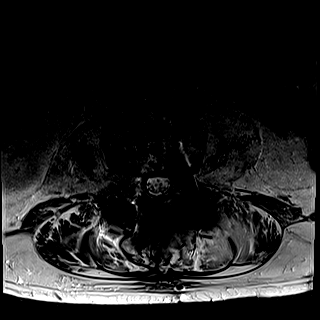
[im 24/43]
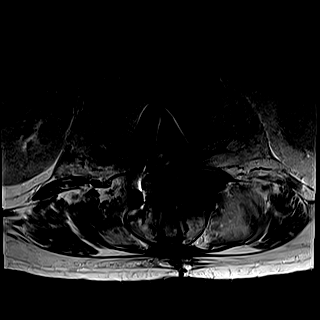
[im 29/43]
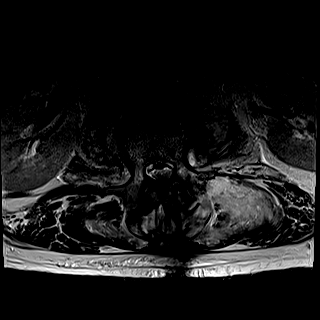
[im 38/43]
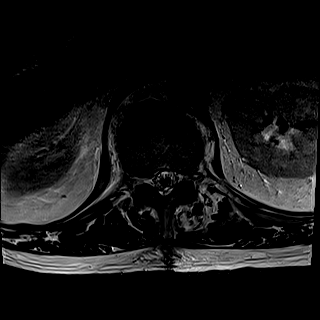
[im 43/43]
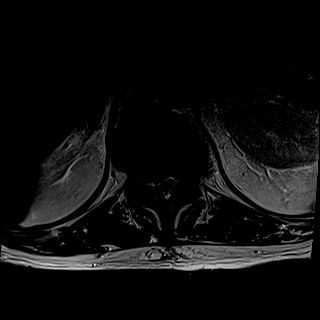

[Series 8: T1 · axial · 4.0mm · 0.39mm/px · z∈[-116,+95]mm · 8 of 43 slices shown (2 of 2)]
[im 1/43]
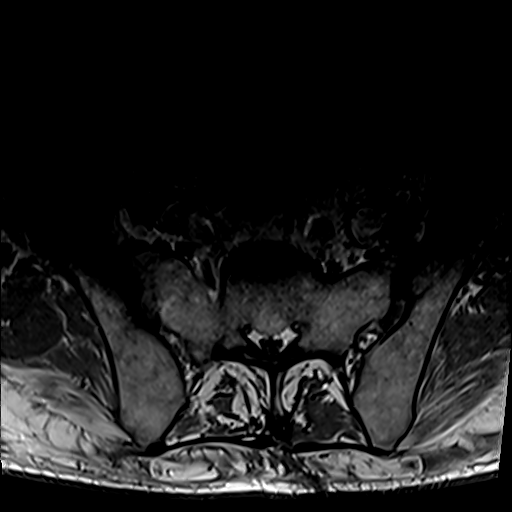
[im 5/43]
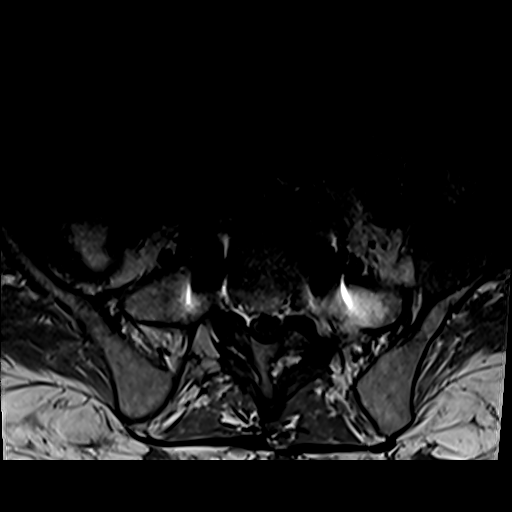
[im 15/43]
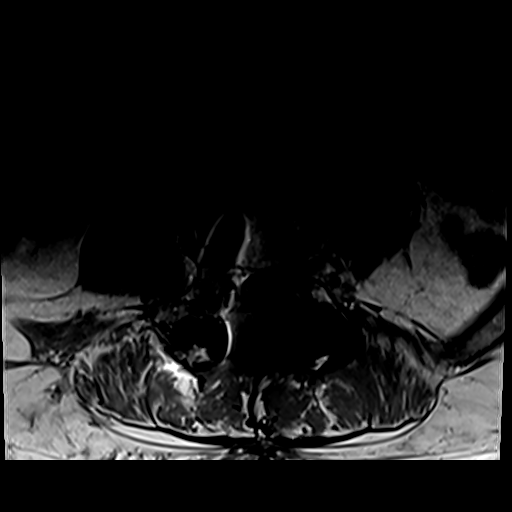
[im 19/43]
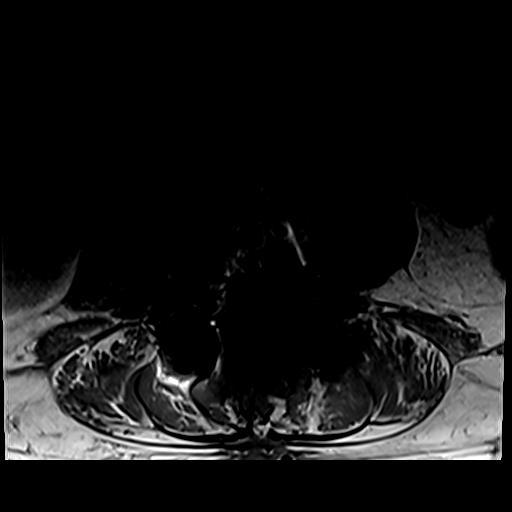
[im 24/43]
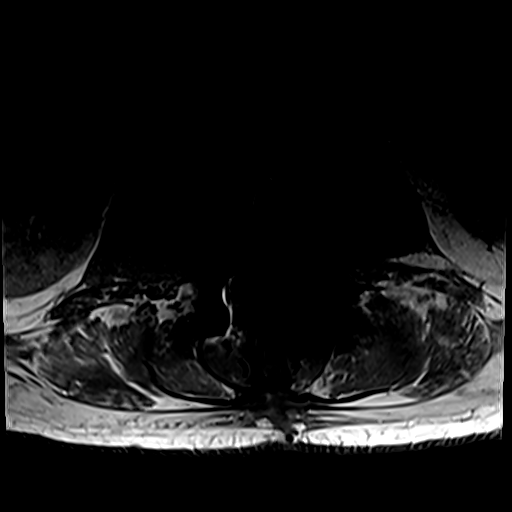
[im 29/43]
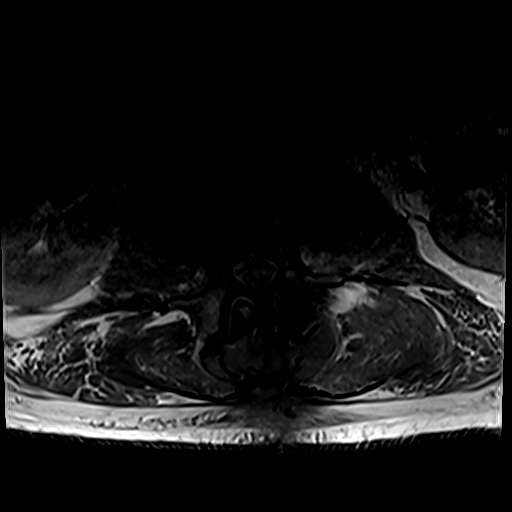
[im 38/43]
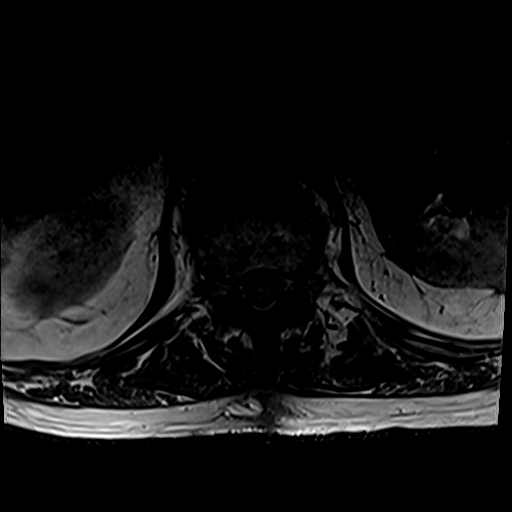
[im 43/43]
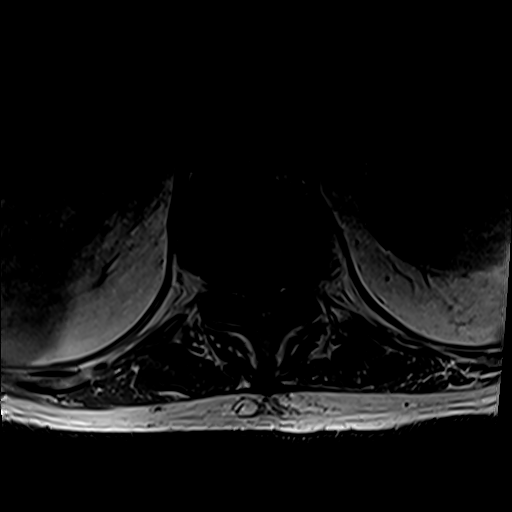

[Series 9: T2 post-contrast · sagittal · 4.0mm · 0.81mm/px · 4 of 17 slices shown]
[im 1/17]
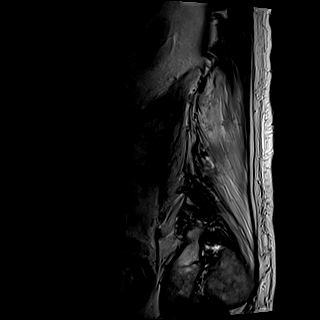
[im 6/17]
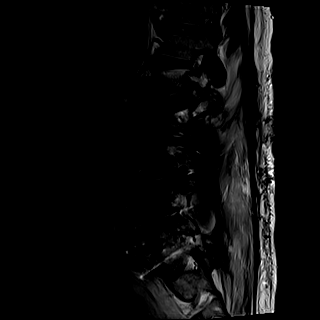
[im 11/17]
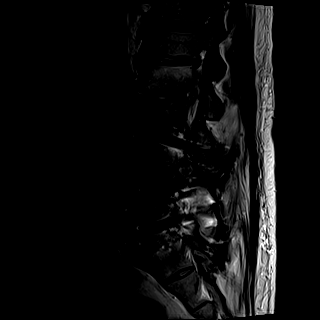
[im 17/17]
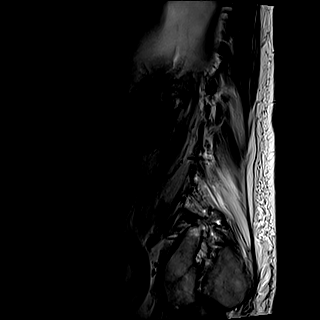

[Series 10: T1 fat-sat post-contrast · sagittal · 4.0mm · 0.81mm/px · 4 of 17 slices shown]
[im 1/17]
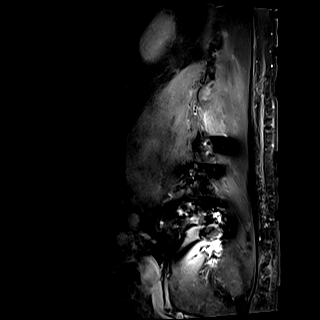
[im 6/17]
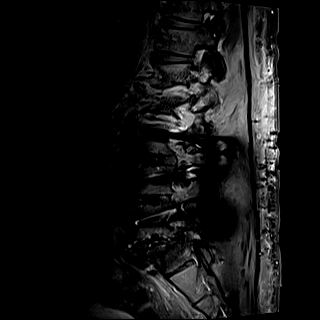
[im 11/17]
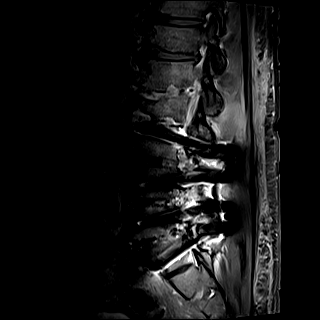
[im 17/17]
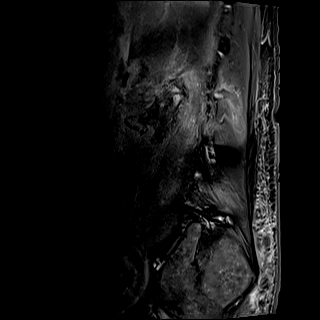

[30 of 48 positions shown; findings below may reference images not displayed]

FINDINGS: Segmentation:  Standard.  Same numbering as on the prior MRI.

Alignment:  Similar grade 1 anterolisthesis of L5 on S1.

Vertebrae: Redemonstrated postsurgical changes of L3-S1 posterior
fusion.

Persistent enhancement of the L1-L2 and L2-L3 endplates, compatible
with discitis. Mildly improved surrounding marrow edema, compatible
with osteomyelitis. No definite progression of vertebral body
collapse.

Ventral epidural phlegmon head similar craniocaudal extension final
proximally L1 to L3-L4. The ventral epidural enhancing phlegmon is
slightly thicker, measuring up to 6-7 mm in thickness (series 11,
image 19), previously 5 mm.

Conus medullaris and cauda equina: Conus extends to the superior L1
level. Conus appears normal.

Paraspinal and other soft tissues: Increased edema and enhancement
of the visualized left greater than right psoas musculature and
posterior paraspinal musculature, compatible with worsening
myositis. Elongated fluid collection within the right psoas muscle
is collapsed. Small/collapse left psoas collection is similar.
Similar presumed postoperative seroma in the posterior paraspinal
soft tissues spanning L4-L5.

Disc levels:

T12-L1: Similar right paracentral disc protrusion without high-grade
stenosis. No change.

L1-L2: Left laminectomy. Similar versus slightly improved mild to
moderate canal stenosis. Similar severe bilateral foraminal
stenosis.

L2-L3: Left laminectomy. Moderate canal stenosis, similar. Similar
severe bilateral foraminal stenosis.

L3-L4: Prior fusion and posterior decompression no evidence of
significant residual stenosis. No change.

L4-L5: Prior fusion and posterior decompression. No significant
residual canal or foraminal stenosis.

L5-S1: Similar grade 1 anterolisthesis. Prior fusion posterior
decompression. No residual canal stenosis. Similar bilateral
foraminal stenosis.
IMPRESSION: 1. Persistent findings of active discitis/osteomyelitis at L1-L2 and
L2-L3 with mildly improved marrow edema. No significant interval
progression in the degree of endplate destruction/enhancement or
vertebral body collapse.
2. Enhancing ventral epidural fluid/phlegmon at the L1-L2 levels is
slightly increased in thickness, measuring up to 6-7 mm thickness at
L2-L3, previously 5 mm. Moderate canal stenosis at L2-L3 is similar
and mild-to-moderate canal stenosis at L1-L2 is similar versus
slightly improved.
3. Increased edema and enhancement of the left greater than right
psoas and posterior paraspinal musculature, concerning for worsening
myositis. Previously seen psoas muscle abscesses are collapsed.
4. Similar superimposed multilevel degenerative change and
postoperative change.

## 2022-01-28 ENCOUNTER — Encounter: Payer: Self-pay | Admitting: Infectious Diseases

## 2022-01-28 ENCOUNTER — Ambulatory Visit (INDEPENDENT_AMBULATORY_CARE_PROVIDER_SITE_OTHER): Payer: Commercial Managed Care - PPO | Admitting: Infectious Diseases

## 2022-01-28 ENCOUNTER — Other Ambulatory Visit: Payer: Self-pay

## 2022-01-28 VITALS — BP 158/99 | HR 77 | Temp 97.9°F | Ht 70.0 in | Wt 170.0 lb

## 2022-01-28 DIAGNOSIS — M4645 Discitis, unspecified, thoracolumbar region: Secondary | ICD-10-CM

## 2022-01-28 DIAGNOSIS — Z5181 Encounter for therapeutic drug level monitoring: Secondary | ICD-10-CM | POA: Diagnosis not present

## 2022-01-28 DIAGNOSIS — Z969 Presence of functional implant, unspecified: Secondary | ICD-10-CM

## 2022-01-28 MED ORDER — DOXYCYCLINE HYCLATE 100 MG PO TABS: 100.0000 mg | ORAL_TABLET | Freq: Two times a day (BID) | ORAL | 11 refills | Status: DC

## 2022-01-28 NOTE — Progress Notes (Addendum)
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   MRSA infection    Diabetic peripheral neuropathy (HCC)    Septic discitis of lumbar region 06/11/2021   Infection of spine (HCC) 05/28/2021   Medication monitoring encounter 05/09/2021   MSSA (methicillin susceptible Staphylococcus aureus) infection 05/09/2021   History of COVID-19    Psoas abscess (HCC)    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   Right shoulder pain 02/14/2021   Chest wall abscess 02/14/2021   Obesity 02/14/2021   Normocytic anemia 02/14/2021   Thrombocytosis 02/14/2021   Endocarditis 02/05/2021   Vision changes 02/04/2021   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   Pressure injury of skin 02/01/2021   HTN (hypertension) 02/01/2021   Anxiety 02/01/2021   Left shoulder pain 02/01/2021   Hyponatremia 01/31/2021   Positive colorectal cancer screening using Cologuard test 12/28/2020   DOE (dyspnea on exertion) 10/20/2020   Diabetic polyneuropathy associated with type 2 diabetes mellitus (HCC) 10/09/2020   Diabetes mellitus (HCC) 10/30/2018   Lumbar stenosis with neurogenic claudication 10/14/2017   DDD (degenerative disc disease), lumbar 02/17/2017   Transient weakness of right lower extremity 02/05/2017   Hyperlipidemia associated with type 2 diabetes mellitus (HCC) 10/24/2016   Depression, recurrent (HCC) 10/24/2016   Right knee pain 05/21/2016   DDD (degenerative disc disease), lumbosacral 05/21/2016   Overweight (BMI 25.0-29.9) 05/21/2016   Herniation of nucleus pulposus of cervical intervertebral disc without myelopathy 02/29/2016   Carpal tunnel syndrome 02/08/2016   Cervical spondylosis with myelopathy 01/17/2016   Lumbar stenosis 12/13/2014   Current Outpatient Medications on File Prior to Visit  Medication Sig Dispense Refill    acetaminophen (TYLENOL) 500 MG tablet Take 1 tablet (500 mg total) by mouth every 6 (six) hours as needed. 30 tablet 0   Baclofen 5 MG TABS TAKE ONE TABLET THREE TIMES DAILY 90 tablet 5   Continuous Blood Gluc Sensor (FREESTYLE LIBRE 2 SENSOR) MISC CHECK BLOOD SUGAR 4 TIMES A DAY 2 each 2   gabapentin (NEURONTIN) 100 MG capsule TAKE ONE CAPSULE TWICE DAILY 60 capsule 5   GNP VITAMIN C 500 MG tablet TAKE ONE TABLET TWICE DAILY 60 tablet 5   morphine (MS CONTIN) 15 MG 12 hr tablet Take 3 tablets (45 mg total) by mouth every 12 (twelve) hours. 180 tablet 0   Multiple Vitamin (MULTIVITAMIN WITH MINERALS) TABS tablet Take 1 tablet by mouth daily.     naloxone (NARCAN) 0.4 MG/ML injection Inject 1 mL (0.4 mg total) into the vein as needed. 1 mL 0   oxyCODONE-acetaminophen (PERCOCET) 10-325 MG tablet Take 1 tablet by mouth every 6 (six) hours as needed (breakthrough pain). 120 tablet 0   zinc sulfate 220 (50 Zn) MG capsule TAKE ONE CAPSULE ONCE DAILY 30 capsule 5   mirtazapine (REMERON) 15 MG tablet Take 15 mg by mouth at bedtime.     No current facility-administered medications on file prior to visit.    Subjective: Here for follow up in the setting of vertebral discitis and osteomyelitis. Accompanied by wife. Has been taking doxycycline twice daily without missing doses. He takes doxycyline with food at times due to feeling sick but able to tolerate it OK for the most part. Denies abdominal pain and diarrhea. Has a cane to help walk but he is able to walk without cane. He  still feels numb at the left thigh region. Denies fevers, chills and sweats. Needs refill of doxycycline. He was bit by an insect in Nov 2022 while inside his home, left a red spot which has resolved. No concerns noted at that time as well as today. He just wanted to inform the doctor but no other concerns. He feels significantly improved since last seen feels stronger in the left leg. He is very happy about the progress made so far.    Review of Systems: ROS All other systems reviewed and are negative  Past Medical History:  Diagnosis Date   Depression    Diabetes mellitus without complication (HCC)    Hypertension    Spinal stenosis    With Neurogenic Claudication   Past Surgical History:  Procedure Laterality Date   CARPAL TUNNEL RELEASE Bilateral    INCISION AND DRAINAGE ABSCESS Left 03/12/2021   Procedure: INCISION AND DRAINAGE ABSCESS;  Surgeon: [name], MD;  Location: [facility];  Service: Orthopedics;  Laterality: Left;   IR US GUIDE BX ASP/DRAIN  02/02/2021   IRRIGATION AND DEBRIDEMENT ABSCESS Left 02/08/2021   Procedure: MINOR INCISION AND DRAINAGE OF ABSCESS;  Surgeon: [name], MD;  Location: [facility];  Service: Orthopedics;  Laterality: Left;   IRRIGATION AND DEBRIDEMENT ABSCESS Left 02/10/2021   Procedure: IRRIGATION AND DEBRIDEMENT ABSCESS LEFT LATERAL CHEST WALL;  Surgeon: [name], MD;  Location: [facility];  Service: General;  Laterality: Left;   IRRIGATION AND DEBRIDEMENT ABSCESS Bilateral 02/20/2021   Procedure: MINOR INCISION AND DRAINAGE OF ABSCESS/WITH BILATERAL WOUND VAC PLACEMENT;  Surgeon: [name], MD;  Location: [facility];  Service: Orthopedics;  Laterality: Bilateral;   IRRIGATION AND DEBRIDEMENT SHOULDER Bilateral 02/28/2021   Procedure: IRRIGATION AND DEBRIDEMENT SHOULDER;  Surgeon: [name], MD;  Location: [facility];  Service: Orthopedics;  Laterality: Bilateral;   IRRIGATION AND DEBRIDEMENT SHOULDER Left 03/06/2021   Procedure: IRRIGATION AND DEBRIDEMENT SHOULDER;  Surgeon: [name], MD;  Location: [facility];  Service: Orthopedics;  Laterality: Left;   IRRIGATION AND DEBRIDEMENT SHOULDER Left 03/08/2021   Procedure: IRRIGATION AND DEBRIDEMENT SHOULDER;  Surgeon: [name], MD;  Location: [facility];  Service: Orthopedics;  Laterality: Left;   LUMBAR LAMINECTOMY/DECOMPRESSION MICRODISCECTOMY N/A 12/13/2014   Procedure: LUMBAR THREE-FOUR, LUMBAR FOUR-FIVE, LUMBAR FIVE-SACRAL  ONE LAMINECTOMY;  Surgeon: [name], MD;  Location: [facility];  Service: Neurosurgery;  Laterality: N/A;  L3-4 L4-5 L5-S1 Laminectomy   LUMBAR LAMINECTOMY/DECOMPRESSION MICRODISCECTOMY Left 06/05/2021   Procedure: Left lumbar one-two, Lumbar two-three Laminectomy;  Surgeon: [name], MD;  Location: [facility];  Service: Neurosurgery;  Laterality: Left;   MENISCUS REPAIR     Right knee   MINOR APPLICATION OF WOUND VAC Left 02/20/2021   Procedure: MINOR APPLICATION OF KERLIX PACKING/REMOVAL OF WOUND VAC;  Surgeon: [name], MD;  Location: [facility];  Service: General;  Laterality: Left;   TEE WITHOUT CARDIOVERSION N/A 02/05/2021   Procedure: TRANSESOPHAGEAL ECHOCARDIOGRAM (TEE);  Surgeon: Quintella Reichert, MD;  Location[facility];  Service: Cardiovascular;  Laterality: N/A;   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   TOTAL SHOULDER ARTHROPLASTY Right 02/10/2021   Procedure: RIGHT OPEN SHOULDER I & D, OPEN DISTAL CLAVICLE RESECTION;  Surgeon: [name], MD;  Location: [facility];  Service: Orthopedics;  Laterality: Right;   VASECTOMY      Social History   Tobacco Use   Smoking status: Former    Packs/day: 2.00    Years: 15.00  Pack years: 30.00    Types: Cigarettes    Quit date: 2012    Years since quitting: 11.4   Smokeless tobacco: Never  Vaping Use   Vaping Use: Never used  Substance Use Topics   Alcohol use: Yes    Comment: occasional   Drug use: No    Family History  Problem Relation Age of Onset   Cancer Mother    Colon cancer Neg Hx     Allergies  Allergen Reactions   Acetaminophen    Tizanidine Other (See Comments)    Leg cramps    Health Maintenance  Topic Date Due   Zoster Vaccines- Shingrix (1 of 2) Never done   COVID-19 Vaccine (3 - Booster for Moderna series) 03/13/2020   INFLUENZA VACCINE  03/26/2022   URINE MICROALBUMIN  04/23/2022   HEMOGLOBIN A1C  05/11/2022   FOOT EXAM  11/17/2022   OPHTHALMOLOGY EXAM  11/17/2022   Fecal  DNA (Cologuard)  11/03/2023   TETANUS/TDAP  08/14/2024   Hepatitis C Screening  Completed   HIV Screening  Completed   HPV VACCINES  Aged Out    Objective: BP (!) 158/99   Pulse 77   Temp 97.9 F (36.6 C) (Oral)   Ht 5\' 10"  (1.778 m)   Wt 170 lb (77.1 kg)   SpO2 99%   BMI 24.39 kg/m    Physical Exam Constitutional:      Appearance: Normal appearance. Has a cane, ambulatory HENT:     Head: Normocephalic and atraumatic.      Mouth: Mucous membranes are moist.  Eyes:    Conjunctiva/sclera: Conjunctivae normal.     Pupils:  Cardiovascular:     Rate and Rhythm: Normal rate and regular rhythm.     Heart sounds:   Pulmonary:     Effort: Pulmonary effort is normal.     Breath sounds:  Abdominal:     General: Abdomen is flat.     Palpations: Abdomen is soft.   Musculoskeletal:        General:  Able to walk with cane Power RT LE 4+ to 5 /5 in proximal and distal muscles             Left LE 4/5 in proximal and distal muscles  Skin:    General: Skin is warm and dry.     Comments:  Neurological:     General: left lower extremity weakness as above     Mental Status: awake, alert and oriented to person, place, and time.   Psychiatric:        Mood and Affect: Mood normal.   Lab Results Lab Results  Component Value Date   WBC 8.6 11/08/2021   HGB 13.5 11/08/2021   HCT 39.9 11/08/2021   MCV 84 11/08/2021   PLT 364 11/08/2021    Lab Results  Component Value Date   CREATININE 0.86 11/08/2021   BUN 10 11/08/2021   NA 138 11/08/2021   K 4.5 11/08/2021   CL 95 (L) 11/08/2021   CO2 26 11/08/2021    Lab Results  Component Value Date   ALT 13 11/08/2021   AST 13 11/08/2021   ALKPHOS 109 11/08/2021   BILITOT 0.6 11/08/2021    Lab Results  Component Value Date   CHOL 146 11/08/2021   HDL 43 11/08/2021   LDLCALC 85 11/08/2021   TRIG 97 11/08/2021   CHOLHDL 3.4 11/08/2021   No results found for: LABRPR, RPRTITER No results found for: HIV1RNAQUANT,  HIV1RNAVL, CD4TABS  Problem List Items Addressed This Visit       Musculoskeletal and Integument   Discitis of thoracolumbar region     Other   Medication monitoring encounter - Primary   Relevant Orders   Basic metabolic panel   CBC   C-reactive protein   Sedimentation rate   Presence of retained hardware    Assessment/Plan # 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 Medications refilled  Labs today  Fu with PT Fu in 3 months   I have personally spent 40  minutes involved in face-to-face and non-face-to-face activities for this patient on the day of the visit including counseling of the patient, discussing laboratory values as well as treatment plan and future monitoring  Victoriano LainSabina Manadhar, MD [facility] 01/28/2022, 1:36 PM

## 2022-01-29 LAB — BASIC METABOLIC PANEL
BUN/Creatinine Ratio: 10 (calc) (ref 6–22)
BUN: 6 mg/dL — ABNORMAL LOW (ref 7–25)
CO2: 27 mmol/L (ref 20–32)
Calcium: 9.8 mg/dL (ref 8.6–10.3)
Chloride: 104 mmol/L (ref 98–110)
Creat: 0.61 mg/dL — ABNORMAL LOW (ref 0.70–1.30)
Glucose, Bld: 132 mg/dL — ABNORMAL HIGH (ref 65–99)
Potassium: 4.4 mmol/L (ref 3.5–5.3)
Sodium: 143 mmol/L (ref 135–146)

## 2022-01-29 LAB — CBC
HCT: 41.2 % (ref 38.5–50.0)
Hemoglobin: 14 g/dL (ref 13.2–17.1)
MCH: 27.9 pg (ref 27.0–33.0)
MCHC: 34 g/dL (ref 32.0–36.0)
MCV: 82.2 fL (ref 80.0–100.0)
MPV: 8.8 fL (ref 7.5–12.5)
Platelets: 546 10*3/uL — ABNORMAL HIGH (ref 140–400)
RBC: 5.01 10*6/uL (ref 4.20–5.80)
RDW: 14.4 % (ref 11.0–15.0)
WBC: 8.4 10*3/uL (ref 3.8–10.8)

## 2022-01-29 LAB — SEDIMENTATION RATE: Sed Rate: 45 mm/h — ABNORMAL HIGH (ref 0–20)

## 2022-01-29 LAB — C-REACTIVE PROTEIN: CRP: 41.3 mg/L — ABNORMAL HIGH (ref <8.0)

## 2022-02-19 ENCOUNTER — Encounter: Payer: Self-pay | Admitting: Family

## 2022-02-19 ENCOUNTER — Other Ambulatory Visit: Payer: Self-pay

## 2022-02-19 ENCOUNTER — Ambulatory Visit (INDEPENDENT_AMBULATORY_CARE_PROVIDER_SITE_OTHER): Payer: Commercial Managed Care - PPO | Admitting: Family

## 2022-02-19 VITALS — BP 117/72 | HR 82 | Temp 97.4°F | Ht 70.0 in | Wt 166.6 lb

## 2022-02-19 DIAGNOSIS — I10 Essential (primary) hypertension: Secondary | ICD-10-CM

## 2022-02-19 DIAGNOSIS — E1142 Type 2 diabetes mellitus with diabetic polyneuropathy: Secondary | ICD-10-CM

## 2022-02-19 DIAGNOSIS — F419 Anxiety disorder, unspecified: Secondary | ICD-10-CM

## 2022-02-19 DIAGNOSIS — E1169 Type 2 diabetes mellitus with other specified complication: Secondary | ICD-10-CM

## 2022-02-19 DIAGNOSIS — E785 Hyperlipidemia, unspecified: Secondary | ICD-10-CM

## 2022-02-19 DIAGNOSIS — M5137 Other intervertebral disc degeneration, lumbosacral region: Secondary | ICD-10-CM | POA: Diagnosis not present

## 2022-02-19 DIAGNOSIS — F339 Major depressive disorder, recurrent, unspecified: Secondary | ICD-10-CM

## 2022-02-19 DIAGNOSIS — M48062 Spinal stenosis, lumbar region with neurogenic claudication: Secondary | ICD-10-CM

## 2022-02-19 LAB — BAYER DCA HB A1C WAIVED: HB A1C (BAYER DCA - WAIVED): 6.6 % — ABNORMAL HIGH (ref 4.8–5.6)

## 2022-02-20 LAB — CMP14+EGFR
ALT: 14 IU/L (ref 0–44)
AST: 19 IU/L (ref 0–40)
Albumin/Globulin Ratio: 1.5 (ref 1.2–2.2)
Albumin: 4.5 g/dL (ref 3.8–4.9)
Alkaline Phosphatase: 125 IU/L — ABNORMAL HIGH (ref 44–121)
BUN/Creatinine Ratio: 12 (ref 9–20)
BUN: 8 mg/dL (ref 6–24)
Bilirubin Total: 0.3 mg/dL (ref 0.0–1.2)
CO2: 25 mmol/L (ref 20–29)
Calcium: 10.2 mg/dL (ref 8.7–10.2)
Chloride: 99 mmol/L (ref 96–106)
Creatinine, Ser: 0.67 mg/dL — ABNORMAL LOW (ref 0.76–1.27)
Globulin, Total: 3.1 g/dL (ref 1.5–4.5)
Glucose: 164 mg/dL — ABNORMAL HIGH (ref 70–99)
Potassium: 5 mmol/L (ref 3.5–5.2)
Sodium: 138 mmol/L (ref 134–144)
Total Protein: 7.6 g/dL (ref 6.0–8.5)
eGFR: 111 mL/min/{1.73_m2} (ref >59)

## 2022-02-20 LAB — C-REACTIVE PROTEIN: CRP: 56 mg/L — ABNORMAL HIGH (ref 0–10)

## 2022-02-21 ENCOUNTER — Encounter: Payer: Self-pay | Admitting: Infectious Diseases

## 2022-04-22 ENCOUNTER — Emergency Department (HOSPITAL_COMMUNITY): Payer: Commercial Managed Care - PPO

## 2022-04-22 ENCOUNTER — Other Ambulatory Visit: Payer: Self-pay

## 2022-04-22 ENCOUNTER — Inpatient Hospital Stay (HOSPITAL_COMMUNITY)
Admission: EM | Admit: 2022-04-22 | Discharge: 2022-05-08 | DRG: 623 | Disposition: A | Discharge status: Rehabilitation Facility | Payer: Commercial Managed Care - PPO | Attending: Family Medicine | Admitting: Family Medicine

## 2022-04-22 DIAGNOSIS — Z79891 Long term (current) use of opiate analgesic: Secondary | ICD-10-CM | POA: Diagnosis not present

## 2022-04-22 DIAGNOSIS — B965 Pseudomonas (aeruginosa) (mallei) (pseudomallei) as the cause of diseases classified elsewhere: Secondary | ICD-10-CM | POA: Diagnosis present

## 2022-04-22 DIAGNOSIS — G061 Intraspinal abscess and granuloma: Secondary | ICD-10-CM | POA: Diagnosis present

## 2022-04-22 DIAGNOSIS — I5032 Chronic diastolic (congestive) heart failure: Secondary | ICD-10-CM | POA: Diagnosis present

## 2022-04-22 DIAGNOSIS — M8608 Acute hematogenous osteomyelitis, other sites: Secondary | ICD-10-CM | POA: Diagnosis not present

## 2022-04-22 DIAGNOSIS — R200 Anesthesia of skin: Secondary | ICD-10-CM | POA: Diagnosis present

## 2022-04-22 DIAGNOSIS — M4314 Spondylolisthesis, thoracic region: Secondary | ICD-10-CM | POA: Diagnosis present

## 2022-04-22 DIAGNOSIS — M4624 Osteomyelitis of vertebra, thoracic region: Secondary | ICD-10-CM | POA: Diagnosis present

## 2022-04-22 DIAGNOSIS — G8929 Other chronic pain: Secondary | ICD-10-CM

## 2022-04-22 DIAGNOSIS — K5900 Constipation, unspecified: Secondary | ICD-10-CM | POA: Diagnosis not present

## 2022-04-22 DIAGNOSIS — M4644 Discitis, unspecified, thoracic region: Secondary | ICD-10-CM | POA: Diagnosis not present

## 2022-04-22 DIAGNOSIS — M009 Pyogenic arthritis, unspecified: Secondary | ICD-10-CM | POA: Diagnosis present

## 2022-04-22 DIAGNOSIS — Z8679 Personal history of other diseases of the circulatory system: Secondary | ICD-10-CM | POA: Diagnosis not present

## 2022-04-22 DIAGNOSIS — B9561 Methicillin susceptible Staphylococcus aureus infection as the cause of diseases classified elsewhere: Secondary | ICD-10-CM | POA: Diagnosis not present

## 2022-04-22 DIAGNOSIS — Z8661 Personal history of infections of the central nervous system: Secondary | ICD-10-CM

## 2022-04-22 DIAGNOSIS — F32A Depression, unspecified: Secondary | ICD-10-CM | POA: Diagnosis present

## 2022-04-22 DIAGNOSIS — Z87891 Personal history of nicotine dependence: Secondary | ICD-10-CM

## 2022-04-22 DIAGNOSIS — Z79899 Other long term (current) drug therapy: Secondary | ICD-10-CM | POA: Diagnosis not present

## 2022-04-22 DIAGNOSIS — Z792 Long term (current) use of antibiotics: Secondary | ICD-10-CM | POA: Diagnosis not present

## 2022-04-22 DIAGNOSIS — G894 Chronic pain syndrome: Secondary | ICD-10-CM | POA: Diagnosis present

## 2022-04-22 DIAGNOSIS — E1165 Type 2 diabetes mellitus with hyperglycemia: Secondary | ICD-10-CM | POA: Diagnosis present

## 2022-04-22 DIAGNOSIS — M00012 Staphylococcal arthritis, left shoulder: Secondary | ICD-10-CM | POA: Diagnosis not present

## 2022-04-22 DIAGNOSIS — L0291 Cutaneous abscess, unspecified: Secondary | ICD-10-CM | POA: Diagnosis not present

## 2022-04-22 DIAGNOSIS — R21 Rash and other nonspecific skin eruption: Secondary | ICD-10-CM | POA: Diagnosis present

## 2022-04-22 DIAGNOSIS — E119 Type 2 diabetes mellitus without complications: Secondary | ICD-10-CM | POA: Diagnosis not present

## 2022-04-22 DIAGNOSIS — Z96611 Presence of right artificial shoulder joint: Secondary | ICD-10-CM | POA: Diagnosis present

## 2022-04-22 DIAGNOSIS — T847XXD Infection and inflammatory reaction due to other internal orthopedic prosthetic devices, implants and grafts, subsequent encounter: Secondary | ICD-10-CM | POA: Diagnosis not present

## 2022-04-22 DIAGNOSIS — M4804 Spinal stenosis, thoracic region: Secondary | ICD-10-CM | POA: Diagnosis present

## 2022-04-22 DIAGNOSIS — M4645 Discitis, unspecified, thoracolumbar region: Secondary | ICD-10-CM | POA: Diagnosis present

## 2022-04-22 DIAGNOSIS — Z8614 Personal history of Methicillin resistant Staphylococcus aureus infection: Secondary | ICD-10-CM

## 2022-04-22 DIAGNOSIS — Z794 Long term (current) use of insulin: Secondary | ICD-10-CM

## 2022-04-22 DIAGNOSIS — M8668 Other chronic osteomyelitis, other site: Secondary | ICD-10-CM | POA: Diagnosis not present

## 2022-04-22 DIAGNOSIS — M4626 Osteomyelitis of vertebra, lumbar region: Secondary | ICD-10-CM | POA: Diagnosis present

## 2022-04-22 DIAGNOSIS — M869 Osteomyelitis, unspecified: Secondary | ICD-10-CM

## 2022-04-22 DIAGNOSIS — Z981 Arthrodesis status: Secondary | ICD-10-CM

## 2022-04-22 DIAGNOSIS — M48061 Spinal stenosis, lumbar region without neurogenic claudication: Secondary | ICD-10-CM | POA: Diagnosis present

## 2022-04-22 DIAGNOSIS — Z888 Allergy status to other drugs, medicaments and biological substances status: Secondary | ICD-10-CM

## 2022-04-22 DIAGNOSIS — M6281 Muscle weakness (generalized): Secondary | ICD-10-CM | POA: Diagnosis present

## 2022-04-22 DIAGNOSIS — M545 Low back pain, unspecified: Secondary | ICD-10-CM

## 2022-04-22 DIAGNOSIS — A498 Other bacterial infections of unspecified site: Secondary | ICD-10-CM | POA: Diagnosis not present

## 2022-04-22 DIAGNOSIS — D649 Anemia, unspecified: Secondary | ICD-10-CM | POA: Diagnosis not present

## 2022-04-22 DIAGNOSIS — I77810 Thoracic aortic ectasia: Secondary | ICD-10-CM | POA: Diagnosis present

## 2022-04-22 DIAGNOSIS — B9562 Methicillin resistant Staphylococcus aureus infection as the cause of diseases classified elsewhere: Secondary | ICD-10-CM | POA: Diagnosis not present

## 2022-04-22 DIAGNOSIS — I10 Essential (primary) hypertension: Secondary | ICD-10-CM | POA: Diagnosis not present

## 2022-04-22 DIAGNOSIS — Z8619 Personal history of other infectious and parasitic diseases: Secondary | ICD-10-CM

## 2022-04-22 DIAGNOSIS — E1169 Type 2 diabetes mellitus with other specified complication: Principal | ICD-10-CM | POA: Diagnosis present

## 2022-04-22 DIAGNOSIS — M4625 Osteomyelitis of vertebra, thoracolumbar region: Secondary | ICD-10-CM | POA: Diagnosis present

## 2022-04-22 DIAGNOSIS — G062 Extradural and subdural abscess, unspecified: Secondary | ICD-10-CM | POA: Diagnosis not present

## 2022-04-22 DIAGNOSIS — I110 Hypertensive heart disease with heart failure: Secondary | ICD-10-CM | POA: Diagnosis present

## 2022-04-22 DIAGNOSIS — Z8659 Personal history of other mental and behavioral disorders: Secondary | ICD-10-CM | POA: Diagnosis not present

## 2022-04-22 DIAGNOSIS — Z86711 Personal history of pulmonary embolism: Secondary | ICD-10-CM

## 2022-04-22 DIAGNOSIS — I330 Acute and subacute infective endocarditis: Secondary | ICD-10-CM | POA: Diagnosis not present

## 2022-04-22 DIAGNOSIS — M792 Neuralgia and neuritis, unspecified: Secondary | ICD-10-CM | POA: Diagnosis not present

## 2022-04-22 DIAGNOSIS — D72829 Elevated white blood cell count, unspecified: Secondary | ICD-10-CM | POA: Diagnosis not present

## 2022-04-22 DIAGNOSIS — R7881 Bacteremia: Secondary | ICD-10-CM | POA: Diagnosis not present

## 2022-04-22 LAB — CBC WITH DIFFERENTIAL/PLATELET
Abs Immature Granulocytes: 0.06 10*3/uL (ref 0.00–0.07)
Basophils Absolute: 0.1 10*3/uL (ref 0.0–0.1)
Basophils Relative: 1 %
Eosinophils Absolute: 0.6 10*3/uL — ABNORMAL HIGH (ref 0.0–0.5)
Eosinophils Relative: 5 %
HCT: 36.1 % — ABNORMAL LOW (ref 39.0–52.0)
Hemoglobin: 11.4 g/dL — ABNORMAL LOW (ref 13.0–17.0)
Immature Granulocytes: 1 %
Lymphocytes Relative: 21 %
Lymphs Abs: 2.2 10*3/uL (ref 0.7–4.0)
MCH: 25.7 pg — ABNORMAL LOW (ref 26.0–34.0)
MCHC: 31.6 g/dL (ref 30.0–36.0)
MCV: 81.3 fL (ref 80.0–100.0)
Monocytes Absolute: 0.8 10*3/uL (ref 0.1–1.0)
Monocytes Relative: 7 %
Neutro Abs: 6.9 10*3/uL (ref 1.7–7.7)
Neutrophils Relative %: 65 %
Platelets: 700 10*3/uL — ABNORMAL HIGH (ref 150–400)
RBC: 4.44 MIL/uL (ref 4.22–5.81)
RDW: 14.6 % (ref 11.5–15.5)
WBC: 10.6 10*3/uL — ABNORMAL HIGH (ref 4.0–10.5)
nRBC: 0 % (ref 0.0–0.2)

## 2022-04-22 LAB — URINALYSIS, ROUTINE W REFLEX MICROSCOPIC
Bacteria, UA: NONE SEEN
Bilirubin Urine: NEGATIVE
Glucose, UA: NEGATIVE mg/dL
Ketones, ur: NEGATIVE mg/dL
Leukocytes,Ua: NEGATIVE
Nitrite: NEGATIVE
Protein, ur: NEGATIVE mg/dL
Specific Gravity, Urine: 1.009 (ref 1.005–1.030)
pH: 5 (ref 5.0–8.0)

## 2022-04-22 LAB — COMPREHENSIVE METABOLIC PANEL
ALT: 14 U/L (ref 0–44)
AST: 15 U/L (ref 15–41)
Albumin: 3.5 g/dL (ref 3.5–5.0)
Alkaline Phosphatase: 88 U/L (ref 38–126)
Anion gap: 10 (ref 5–15)
BUN: 7 mg/dL (ref 6–20)
CO2: 29 mmol/L (ref 22–32)
Calcium: 9.7 mg/dL (ref 8.9–10.3)
Chloride: 98 mmol/L (ref 98–111)
Creatinine, Ser: 0.65 mg/dL (ref 0.61–1.24)
GFR, Estimated: >60 mL/min (ref >60)
Glucose, Bld: 173 mg/dL — ABNORMAL HIGH (ref 70–99)
Potassium: 3.9 mmol/L (ref 3.5–5.1)
Sodium: 137 mmol/L (ref 135–145)
Total Bilirubin: 0.6 mg/dL (ref 0.3–1.2)
Total Protein: 8.4 g/dL — ABNORMAL HIGH (ref 6.5–8.1)

## 2022-04-22 LAB — LACTIC ACID, PLASMA: Lactic Acid, Venous: 1.4 mmol/L (ref 0.5–1.9)

## 2022-04-22 MED ORDER — ACETAMINOPHEN 650 MG RE SUPP: 650.0000 mg | Freq: Four times a day (QID) | RECTAL | Status: DC | PRN

## 2022-04-22 MED ORDER — NALOXONE HCL 0.4 MG/ML IJ SOLN: 0.4000 mg | INTRAMUSCULAR | Status: DC | PRN

## 2022-04-22 MED ORDER — ONDANSETRON HCL 4 MG/2ML IJ SOLN: 4.0000 mg | Freq: Four times a day (QID) | INTRAMUSCULAR | Status: DC | PRN

## 2022-04-22 MED ORDER — LORAZEPAM 1 MG PO TABS
1.0000 mg | ORAL_TABLET | Freq: Once | ORAL | Status: AC
Start: 2022-04-22 — End: 2022-04-22
  Administered 2022-04-22: 1 mg via ORAL
  Filled 2022-04-22: qty 1

## 2022-04-22 MED ORDER — GADOBUTROL 1 MMOL/ML IV SOLN
8.0000 mL | Freq: Once | INTRAVENOUS | Status: AC | PRN
  Administered 2022-04-22: 8 mL via INTRAVENOUS

## 2022-04-22 MED ORDER — ACETAMINOPHEN 325 MG PO TABS
650.0000 mg | ORAL_TABLET | Freq: Four times a day (QID) | ORAL | Status: DC | PRN
  Administered 2022-05-02: 650 mg via ORAL
  Filled 2022-04-22: qty 2

## 2022-04-22 MED ORDER — FENTANYL CITRATE PF 50 MCG/ML IJ SOSY
50.0000 ug | PREFILLED_SYRINGE | INTRAMUSCULAR | Status: DC | PRN
  Administered 2022-04-23: 50 ug via INTRAVENOUS
  Filled 2022-04-22: qty 1

## 2022-04-22 MED ORDER — OXYCODONE-ACETAMINOPHEN 5-325 MG PO TABS
1.0000 | ORAL_TABLET | Freq: Once | ORAL | Status: AC
  Administered 2022-04-22: 1 via ORAL
  Filled 2022-04-22: qty 1

## 2022-04-22 MED ORDER — CEFAZOLIN SODIUM-DEXTROSE 2-4 GM/100ML-% IV SOLN
2.0000 g | Freq: Three times a day (TID) | INTRAVENOUS | Status: DC
  Administered 2022-04-22 – 2022-04-23 (×2): 2 g via INTRAVENOUS
  Filled 2022-04-22 (×2): qty 100

## 2022-04-22 MED ORDER — SODIUM CHLORIDE 0.9 % IV SOLN: INTRAVENOUS | Status: DC | PRN

## 2022-04-22 NOTE — ED Triage Notes (Signed)
Patient here from home complaining of inability to walk, bilateral leg numbness, urinary incontinence/ urgency for about 3 weeks. Patient states he was hospitalized last year with an infection at [facility] long for 8 weeks but ultimately recovered, however is having difficulty completing ADLs now. Reports intermittent fevers at home.

## 2022-04-22 NOTE — ED Provider Notes (Signed)
[facility] EMERGENCY DEPARTMENT Provider Note   CSN: [id number] Arrival date & time: 04/22/22  1240     History  Chief Complaint  Patient presents with   Numbness   Back Pain    John Ware is a 56 y.o. male.  Patient presents to the hospital complaining of 4 weeks of increased bilateral leg weakness and numbness, urinary incontinence and urgency, and worsening low back pain.  Patient states that over the past year he has been admitted to the hospital for many weeks due to sepsis and osteomyelitis of the lumbar spine.  He states that after his last back surgery he had left leg numbness.  In February of this year he states he was able to transition from a walker to a cane in approximately 6 weeks ago he was able to walk without assist device without difficulty.  He states that 3 to 4 weeks ago he began to feel that his legs were increasingly weak and he is unable to stand without assist.  He states he has begun to have severe pain in the low back along the midline between thoracic and lumbar regions.  He also states his left leg feels somewhat numb when compared to the right.  He denies fevers at home above 99 degrees.  Denies nausea, vomiting, abdominal pain, shortness of breath, chest pain.  Over the past year he has had TEE without cardioversion, I&D of multiple abscesses, total shoulder arthroplasty, subsequent I&D of shoulder, lumbar laminectomy/decompression microdiscectomy, amongst other surgeries.  Patient also has history of DM without complication, hypertension, spinal stenosis, depression, epidural abscess, infective myositis, discitis of thoracolumbar region, cervical spondylosis with myelopathy, MRSA infection, MSSA infection, psoas abscess, chest wall abscess, endocarditis, ascending aorta dilatation, lumbar stenosis with neurogenic claudication, transient weakness of the right lower extremity  HPI     Home Medications Prior to Admission medications    Medication Sig Start Date End Date Taking? Authorizing Provider  acetaminophen (TYLENOL) 500 MG tablet Take 1 tablet (500 mg total) by mouth every 6 (six) hours as needed. 06/26/21   Love, Ivan Anchors, PA-C  Baclofen 5 MG TABS TAKE ONE TABLET THREE TIMES DAILY 07/26/21   Lovorn, Jinny Blossom, MD  Continuous Blood Gluc Sensor (FREESTYLE LIBRE 2 SENSOR) MISC CHECK BLOOD SUGAR 4 TIMES A DAY 11/28/21   Hawks, Christy A, FNP  doxycycline (VIBRA-TABS) 100 MG tablet Take 1 tablet (100 mg total) by mouth 2 (two) times daily. 01/28/22   Rosiland Oz, MD  gabapentin (NEURONTIN) 100 MG capsule TAKE ONE CAPSULE TWICE DAILY 07/26/21   Lovorn, Jinny Blossom, MD  morphine (MS CONTIN) 15 MG 12 hr tablet Take 3 tablets (45 mg total) by mouth every 12 (twelve) hours. 07/09/21   Lovorn, Jinny Blossom, MD  naloxone [facility]) 0.4 MG/ML injection Inject 1 mL (0.4 mg total) into the vein as needed. 06/26/21   Love, Ivan Anchors, PA-C  oxyCODONE-acetaminophen (PERCOCET) 10-325 MG tablet Take 1 tablet by mouth every 6 (six) hours as needed (breakthrough pain). 07/09/21   Lovorn, Jinny Blossom, MD      Allergies    Tizanidine    Review of Systems   Review of Systems  Constitutional:  Negative for fever.  Respiratory:  Negative for shortness of breath.   Cardiovascular:  Negative for chest pain.  Gastrointestinal:  Negative for abdominal pain, nausea and vomiting.  Genitourinary:  Positive for urgency.       Urinary incontinence, increased frequency  Musculoskeletal:  Positive for back pain.  Neurological:  Positive for weakness and numbness. Negative for syncope, speech difficulty, light-headedness and headaches.    Physical Exam Updated Vital Signs BP 105/65   Pulse 69   Temp 98.5 F (36.9 C) (Oral)   Resp 14   SpO2 97%  Physical Exam Vitals and nursing note reviewed.  Constitutional:      General: He is not in acute distress.    Appearance: He is normal weight.  HENT:     Head: Normocephalic and atraumatic.     Mouth/Throat:     Mouth:  Mucous membranes are moist.  Eyes:     Extraocular Movements: Extraocular movements intact.     Conjunctiva/sclera: Conjunctivae normal.     Pupils: Pupils are equal, round, and reactive to light.  Cardiovascular:     Rate and Rhythm: Normal rate and regular rhythm.     Pulses: Normal pulses.     Heart sounds: Normal heart sounds.  Pulmonary:     Effort: Pulmonary effort is normal.     Breath sounds: Normal breath sounds.  Abdominal:     Palpations: Abdomen is soft.     Tenderness: There is no abdominal tenderness.  Musculoskeletal:        General: No swelling or tenderness. Normal range of motion.     Cervical back: Normal range of motion and neck supple.     Right lower leg: No edema.     Left lower leg: No edema.  Skin:    General: Skin is warm and dry.     Capillary Refill: Capillary refill takes less than 2 seconds.  Neurological:     Mental Status: He is alert and oriented to person, place, and time.     Motor: Weakness present.     Coordination: Coordination normal.     Comments: Cranial nerves II through VII, XI, XII grossly intact.  Patient able to raise both legs against gravity, left subjectively weaker than right.  EHL intact bilaterally.  Slightly decreased sensation on left lower extremity when compared to right.     ED Results / Procedures / Treatments   Labs (all labs ordered are listed, but only abnormal results are displayed) Labs Reviewed  COMPREHENSIVE METABOLIC PANEL - Abnormal; Notable for the following components:      Result Value   Glucose, Bld 173 (*)    Total Protein 8.4 (*)    All other components within normal limits  CBC WITH DIFFERENTIAL/PLATELET - Abnormal; Notable for the following components:   WBC 10.6 (*)    Hemoglobin 11.4 (*)    HCT 36.1 (*)    MCH 25.7 (*)    Platelets 700 (*)    Eosinophils Absolute 0.6 (*)    All other components within normal limits  CULTURE, BLOOD (ROUTINE X 2)  CULTURE, BLOOD (ROUTINE X 2)  LACTIC ACID,  PLASMA  URINALYSIS, ROUTINE W REFLEX MICROSCOPIC  LACTIC ACID, PLASMA    EKG None  Radiology MR Lumbar Spine W Wo Contrast  Result Date: 04/22/2022 CLINICAL DATA:  Initial evaluation for back pain, lower extremity weakness. EXAM: MRI LUMBAR SPINE WITHOUT AND WITH CONTRAST TECHNIQUE: Multiplanar and multiecho pulse sequences of the lumbar spine were obtained without and with intravenous contrast. CONTRAST:  62mL GADAVIST GADOBUTROL 1 MMOL/ML IV SOLN COMPARISON:  Prior study from 08/17/2021. FINDINGS: Segmentation: Standard. Lowest well-formed disc space labeled the L5-S1 level. Alignment: Chronic grade 1 anterolisthesis of L5 on S1, stable. Straightening with mild reversal of the normal lumbar lordosis. Vertebrae: Sequelae of prior PLIF  at L3 through S1. Interval evolution of previously seen changes of osteomyelitis discitis at L1-2 and L2-3. Mild residual edema within the L1-2 and L2-3 interspaces, improved from prior exam. New findings of probable acute osteomyelitis discitis about the T11-12 interspace, new from prior. Heterogeneous signal intensity within the adjacent ventral epidural space likely reflects epidural phlegmon and/or abscess, seen only on sagittal position (series 5, image 10). Epidural enhancement extends from the lower thoracic spine to approximately the level of T12-L1 (series 12, image 10). Conus medullaris and cauda equina: Conus extends to the L1 level. Conus medullaris within normal limits. Epidural phlegmon/abscess with enhancement extending from the visualized lower thoracic spine to approximately T12-L1. Mild heterogeneity of the proximal cauda equina at the level of L1-2 likely related to prior infection at this level. Remainder of the nerve roots of the cauda equina are within normal limits. Paraspinal and other soft tissues: Persistent edema within the psoas musculature bilaterally. Edema also seen throughout the posterior paraspinous musculature. Benign postoperative  collection centered at the laminectomy site of L4-5, stable. Probable soft tissue abscess within the subcutaneous fat of the left back at the level of T12 measures approximately 2.4 x 1.1 x 2.9 cm (series 8, image 1). Disc levels: T11-12: Seen only on sagittal projection. Findings concerning for osteomyelitis discitis. Epidural phlegmon/abscess with enhancement seen within the adjacent epidural space. Bilateral facet arthropathy. Resultant mild spinal stenosis. Moderate to severe bilateral foraminal narrowing. T12-L1: Disc desiccation with reactive endplate spurring. Superimposed right subarticular disc protrusion indents the right ventral thecal sac. Superimposed facet hypertrophy. Resultant mild to moderate right lateral recess stenosis. Central canal remains patent. Foramina remain patent as well. L1-2: Interval evolution of changes related to previously identified osteomyelitis discitis. Associated advanced height loss with endplate irregularity. Moderate bilateral facet hypertrophy. No significant spinal stenosis. Mild to moderate bilateral foraminal narrowing. L2-3: Interval evolution of changes related to osteomyelitis discitis within associated advanced disc space height loss. Moderate bilateral facet hypertrophy. No significant spinal stenosis. Moderate to severe bilateral foraminal stenosis, similar to prior. L3-4: Prior PLIF. No residual spinal stenosis. Stable foraminal patency. L4-5: Prior PLIF. No residual spinal stenosis. Stable foraminal patency. L5-S1: Prior PLIF. No residual spinal stenosis. Residual mild to moderate bilateral foraminal stenosis related to underlying listhesis, stable. IMPRESSION: 1. New findings consistent with acute osteomyelitis discitis about the T11-12 interspace. Associated epidural phlegmon/abscess extending from the visualized lower thoracic spine to approximately the level of T12-L1. Resultant mild spinal stenosis at the level of T11-12 without frank cord compression. 2.  2.4 x 1.1 x 2.9 cm probable soft tissue abscess within the subcutaneous fat of the left back at the level of T12. 3. 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 4. Prior PLIF at L3 through S1 without residual or recurrent spinal stenosis. Electronically Signed   By: Jeannine Boga M.D.   On: 04/22/2022 20:08    Procedures Procedures    Medications Ordered in ED Medications  LORazepam (ATIVAN) tablet 1 mg (has no administration in time range)  ceFAZolin (ANCEF) IVPB 2g/100 mL premix (has no administration in time range)  0.9 %  sodium chloride infusion (has no administration in time range)  gadobutrol (GADAVIST) 1 MMOL/ML injection 8 mL (8 mLs Intravenous Contrast Given 04/22/22 1853)  oxyCODONE-acetaminophen (PERCOCET/ROXICET) 5-325 MG per tablet 1 tablet (1 tablet Oral Given 04/22/22 2131)    ED Course/ Medical Decision Making/ A&P  Medical Decision Making Risk Prescription drug management.   This patient presents to the ED for concern of low back pain and lower extremity weakness, this involves an extensive number of treatment options, and is a complaint that carries with it a high risk of complications and morbidity.  The differential diagnosis includes spinal epidural abscess, osteomyelitis, cauda equina, degenerative disc disease, and others   Co morbidities that complicate the patient evaluation  History of osteomyelitis, spinal epidural abscess, and others as noted in HPI   Additional history obtained:  Additional history obtained from family bedside External records from outside source obtained and reviewed including discharge summaries from hospitalizations over the past year for spinal related conditions, infectious disease notes with antibiotic recommendations   Lab Tests:  I Ordered, and personally interpreted labs.  The pertinent results include: WBC  10.6, hemoglobin 11.4, glucose 173, lactic acid 1.4, blood cultures pending   Imaging Studies ordered:  I ordered imaging studies including MRI lumbar spine with and without contrast, MR thoracic spine w w/o contrast I independently visualized and interpreted imaging which showed  1. New findings consistent with acute osteomyelitis discitis about  the T11-12 interspace. Associated epidural phlegmon/abscess  extending from the visualized lower thoracic spine to approximately  the level of T12-L1. Resultant mild spinal stenosis at the level of  T11-12 without frank cord compression.  2. 2.4 x 1.1 x 2.9 cm probable soft tissue abscess within the  subcutaneous fat of the left back at the level of T12.  3. Subacute to chronic changes of osteomyelitis discitis at L1-2 and  L2-3, mildly improved in appearance as compared to 08/17/2021.  4. Prior PLIF at L3 through S1 without residual or recurrent spinal  stenosis.  MRI thoracic spine ordered but not completed at this time I agree with the radiologist interpretation   Consultations Obtained:  I requested consultation with the neurosurgeon on call provider, Leo Grosser FNP,  and discussed lab and imaging findings as well as pertinent plan - they recommend: MR Thoracic, broad spectrum IV abx, and medicine admission Requested consultation with the hospitalist.  Dr. Marilynn Rail spoke with me and agreed to admit the patient for further evaluation and management   Problem List / ED Course / Critical interventions / Medication management   I ordered medication including oxycodone for pain, Ativan for anxiety/sedation, Ancef for antibiotic coverage Reevaluation of the patient after these medicines showed that the patient improved I have reviewed the patients home medicines and have made adjustments as needed   Test / Admission - Considered:  This patient has apparent osteomyelitis in the thoracic spine with possible epidural abscess.  He needs  admission for IV antibiotics and further evaluation and management by a multidisciplinary team.  Admit to hospital        Final Clinical Impression(s) / ED Diagnoses Final diagnoses:  Osteomyelitis of other site, unspecified type (HCC)  Midline low back pain, unspecified chronicity, unspecified whether sciatica present  Numbness    Rx / DC Orders ED Discharge Orders     None         Pamala Duffel 04/22/22 2228    Glyn Ade, MD 04/23/22 0040

## 2022-04-22 NOTE — ED Notes (Signed)
Pt cannot be found and is not answering when being called

## 2022-04-22 NOTE — ED Provider Triage Note (Signed)
Emergency Medicine Provider Triage Evaluation Note  VIRGIL LIGHTNER , a 56 y.o. male  was evaluated in triage.  Pt complains of worsening back pain onset 3 weeks.  Denies recent injury, trauma, fall.  Also notes worsening numbness and weakness to BLE.  Was treated for discitis in October 2022.  Was able to start walking again in February 2023 however notes that for the past 3 weeks his legs have felt like noodles and he has not been able to walk as normal.  Denies saddle paresthesia.  Notes that he has had urinary incontinence.  Review of Systems  Positive:  Negative:   Physical Exam  BP (!) 123/101   Pulse 63   Temp 98.1 F (36.7 C) (Oral)   Resp 18   SpO2 100%  Gen:   Awake, no distress   Resp:  Normal effort  MSK:   Moves extremities without difficulty  Other:  Lumbar spinal tenderness to palpation without overlying skin changes.  Sensation intact to BLE, however, decreased strength noted to BLE.   Medical Decision Making  Medically screening exam initiated at 2:26 PM.  Appropriate orders placed.  Saber L Cozzolino was informed that the remainder of the evaluation will be completed by another provider, this initial triage assessment does not replace that evaluation, and the importance of remaining in the ED until their evaluation is complete.  Work-up initiated.   Jaiceon Collister A, PA-C 04/22/22 1428

## 2022-04-23 ENCOUNTER — Inpatient Hospital Stay (HOSPITAL_COMMUNITY): Payer: Commercial Managed Care - PPO

## 2022-04-23 ENCOUNTER — Encounter (HOSPITAL_COMMUNITY): Payer: Self-pay | Admitting: Internal Medicine

## 2022-04-23 DIAGNOSIS — M4624 Osteomyelitis of vertebra, thoracic region: Secondary | ICD-10-CM

## 2022-04-23 DIAGNOSIS — G062 Extradural and subdural abscess, unspecified: Secondary | ICD-10-CM

## 2022-04-23 DIAGNOSIS — T847XXD Infection and inflammatory reaction due to other internal orthopedic prosthetic devices, implants and grafts, subsequent encounter: Secondary | ICD-10-CM

## 2022-04-23 DIAGNOSIS — I5032 Chronic diastolic (congestive) heart failure: Secondary | ICD-10-CM | POA: Diagnosis present

## 2022-04-23 DIAGNOSIS — A498 Other bacterial infections of unspecified site: Secondary | ICD-10-CM

## 2022-04-23 DIAGNOSIS — B9562 Methicillin resistant Staphylococcus aureus infection as the cause of diseases classified elsewhere: Secondary | ICD-10-CM

## 2022-04-23 DIAGNOSIS — L0291 Cutaneous abscess, unspecified: Secondary | ICD-10-CM

## 2022-04-23 DIAGNOSIS — G8929 Other chronic pain: Secondary | ICD-10-CM

## 2022-04-23 DIAGNOSIS — M00012 Staphylococcal arthritis, left shoulder: Secondary | ICD-10-CM

## 2022-04-23 DIAGNOSIS — M4625 Osteomyelitis of vertebra, thoracolumbar region: Secondary | ICD-10-CM | POA: Diagnosis not present

## 2022-04-23 DIAGNOSIS — I330 Acute and subacute infective endocarditis: Secondary | ICD-10-CM

## 2022-04-23 DIAGNOSIS — B9561 Methicillin susceptible Staphylococcus aureus infection as the cause of diseases classified elsewhere: Secondary | ICD-10-CM

## 2022-04-23 DIAGNOSIS — M8668 Other chronic osteomyelitis, other site: Secondary | ICD-10-CM | POA: Diagnosis not present

## 2022-04-23 DIAGNOSIS — R7881 Bacteremia: Secondary | ICD-10-CM

## 2022-04-23 LAB — BLOOD CULTURE ID PANEL (REFLEXED) - BCID2

## 2022-04-23 LAB — COMPREHENSIVE METABOLIC PANEL
ALT: 11 U/L (ref 0–44)
AST: 13 U/L — ABNORMAL LOW (ref 15–41)
Albumin: 3.1 g/dL — ABNORMAL LOW (ref 3.5–5.0)
Alkaline Phosphatase: 75 U/L (ref 38–126)
Anion gap: 12 (ref 5–15)
BUN: 6 mg/dL (ref 6–20)
CO2: 26 mmol/L (ref 22–32)
Calcium: 9.2 mg/dL (ref 8.9–10.3)
Chloride: 100 mmol/L (ref 98–111)
Creatinine, Ser: 0.61 mg/dL (ref 0.61–1.24)
GFR, Estimated: >60 mL/min (ref >60)
Glucose, Bld: 141 mg/dL — ABNORMAL HIGH (ref 70–99)
Potassium: 4.2 mmol/L (ref 3.5–5.1)
Sodium: 138 mmol/L (ref 135–145)
Total Bilirubin: 0.4 mg/dL (ref 0.3–1.2)
Total Protein: 7.1 g/dL (ref 6.5–8.1)

## 2022-04-23 LAB — C-REACTIVE PROTEIN: CRP: 12.4 mg/dL — ABNORMAL HIGH (ref <1.0)

## 2022-04-23 LAB — CBC WITH DIFFERENTIAL/PLATELET
Abs Immature Granulocytes: 0.05 10*3/uL (ref 0.00–0.07)
Basophils Absolute: 0.1 10*3/uL (ref 0.0–0.1)
Basophils Relative: 1 %
Eosinophils Absolute: 0.4 10*3/uL (ref 0.0–0.5)
Eosinophils Relative: 3 %
HCT: 33.9 % — ABNORMAL LOW (ref 39.0–52.0)
Hemoglobin: 10.6 g/dL — ABNORMAL LOW (ref 13.0–17.0)
Immature Granulocytes: 1 %
Lymphocytes Relative: 22 %
Lymphs Abs: 2.5 10*3/uL (ref 0.7–4.0)
MCH: 25.5 pg — ABNORMAL LOW (ref 26.0–34.0)
MCHC: 31.3 g/dL (ref 30.0–36.0)
MCV: 81.5 fL (ref 80.0–100.0)
Monocytes Absolute: 1 10*3/uL (ref 0.1–1.0)
Monocytes Relative: 9 %
Neutro Abs: 7 10*3/uL (ref 1.7–7.7)
Neutrophils Relative %: 64 %
Platelets: 633 10*3/uL — ABNORMAL HIGH (ref 150–400)
RBC: 4.16 MIL/uL — ABNORMAL LOW (ref 4.22–5.81)
RDW: 14.7 % (ref 11.5–15.5)
WBC: 11 10*3/uL — ABNORMAL HIGH (ref 4.0–10.5)
nRBC: 0 % (ref 0.0–0.2)

## 2022-04-23 LAB — GLUCOSE, CAPILLARY
Glucose-Capillary: 205 mg/dL — ABNORMAL HIGH (ref 70–99)
Glucose-Capillary: 142 mg/dL — ABNORMAL HIGH (ref 70–99)

## 2022-04-23 LAB — MAGNESIUM: Magnesium: 1.7 mg/dL (ref 1.7–2.4)

## 2022-04-23 LAB — CBG MONITORING, ED: Glucose-Capillary: 186 mg/dL — ABNORMAL HIGH (ref 70–99)

## 2022-04-23 LAB — SEDIMENTATION RATE: Sed Rate: 47 mm/hr — ABNORMAL HIGH (ref 0–16)

## 2022-04-23 MED ORDER — MORPHINE SULFATE (PF) 4 MG/ML IV SOLN: 4.0000 mg | INTRAVENOUS | Status: DC | PRN

## 2022-04-23 MED ORDER — VANCOMYCIN HCL 1250 MG/250ML IV SOLN
1250.0000 mg | Freq: Two times a day (BID) | INTRAVENOUS | Status: DC
  Administered 2022-04-24: 1250 mg via INTRAVENOUS
  Filled 2022-04-23: qty 250

## 2022-04-23 MED ORDER — MORPHINE SULFATE ER 30 MG PO TBCR
45.0000 mg | EXTENDED_RELEASE_TABLET | Freq: Two times a day (BID) | ORAL | Status: DC
  Filled 2022-04-23: qty 1

## 2022-04-23 MED ORDER — MORPHINE SULFATE ER 30 MG PO TBCR
30.0000 mg | EXTENDED_RELEASE_TABLET | Freq: Once | ORAL | Status: AC
  Administered 2022-04-23: 30 mg via ORAL
  Filled 2022-04-23: qty 1

## 2022-04-23 MED ORDER — MORPHINE SULFATE ER 15 MG PO TBCR
45.0000 mg | EXTENDED_RELEASE_TABLET | Freq: Two times a day (BID) | ORAL | Status: DC
  Administered 2022-04-23 – 2022-05-08 (×30): 45 mg via ORAL
  Filled 2022-04-23 (×30): qty 3

## 2022-04-23 MED ORDER — BACLOFEN 10 MG PO TABS
5.0000 mg | ORAL_TABLET | Freq: Three times a day (TID) | ORAL | Status: DC
  Administered 2022-04-23 – 2022-05-08 (×45): 5 mg via ORAL
  Filled 2022-04-23 (×46): qty 1

## 2022-04-23 MED ORDER — MORPHINE SULFATE ER 15 MG PO TBCR
15.0000 mg | EXTENDED_RELEASE_TABLET | Freq: Once | ORAL | Status: AC
  Administered 2022-04-23: 15 mg via ORAL
  Filled 2022-04-23: qty 1

## 2022-04-23 MED ORDER — LACTATED RINGERS IV SOLN: INTRAVENOUS | Status: AC

## 2022-04-23 MED ORDER — OXYCODONE-ACETAMINOPHEN 5-325 MG PO TABS
2.0000 | ORAL_TABLET | Freq: Four times a day (QID) | ORAL | Status: DC | PRN
  Administered 2022-04-23 – 2022-05-08 (×53): 2 via ORAL
  Filled 2022-04-23 (×54): qty 2

## 2022-04-23 MED ORDER — VANCOMYCIN HCL 1500 MG/300ML IV SOLN
1500.0000 mg | Freq: Once | INTRAVENOUS | Status: AC
  Administered 2022-04-23: 1500 mg via INTRAVENOUS
  Filled 2022-04-23: qty 300

## 2022-04-23 MED ORDER — GABAPENTIN 100 MG PO CAPS
100.0000 mg | ORAL_CAPSULE | Freq: Two times a day (BID) | ORAL | Status: DC
Start: 2022-04-23 — End: 2022-05-03
  Administered 2022-04-23 – 2022-05-03 (×21): 100 mg via ORAL
  Filled 2022-04-23 (×21): qty 1

## 2022-04-23 MED ORDER — MORPHINE SULFATE (PF) 2 MG/ML IV SOLN
4.0000 mg | INTRAVENOUS | Status: DC | PRN
  Administered 2022-04-23 – 2022-05-02 (×20): 4 mg via INTRAVENOUS
  Filled 2022-04-23 (×21): qty 2

## 2022-04-23 MED ORDER — GADOBUTROL 1 MMOL/ML IV SOLN
8.0000 mL | Freq: Once | INTRAVENOUS | Status: AC | PRN
Start: 2022-04-23 — End: 2022-04-23
  Administered 2022-04-23: 8 mL via INTRAVENOUS

## 2022-04-23 MED ORDER — INSULIN ASPART 100 UNIT/ML IJ SOLN
0.0000 [IU] | Freq: Three times a day (TID) | INTRAMUSCULAR | Status: DC
  Administered 2022-05-03: 2 [IU] via SUBCUTANEOUS

## 2022-04-23 NOTE — Consult Note (Signed)
Reason for Consult: Recurrent osteomyelitis and discitis T10-11 T11-T12 Referring Physician: Dr. Larina Bras is an 56 y.o. male.  HPI: John Ware is a 56 year old individual who is well-known to me.  About 5 years ago he underwent posterior decompression in the lower lumbar spine secondary to a spondylolisthesis and severe stenosis.  In October of last year he was hospitalized with epidural abscess and osteomyelitis at L1-2 and L2-3 and underwent surgical decompression of a high-grade stenosis secondary to epidural abscess.  Patient had a slow but gradual recovery and was able to walk independently his legs were severely weak when I seen him last October prior to his surgery.  John Ware has been on oral suppressive therapy with doxycycline.  He tells me that he is followed in the pain clinic and couple months ago he noted that he had some increased back pain.  Is also noted that he had a small mass on his back.  His back pain is continued and more recently he is noted that there is been increasing weakness in his legs particularly if he stands for any length of time.  This is particularly worsened in the last weeks time.  He was seen in the emergency department yesterday where repeat MRI of the lumbar spine demonstrates extension of epidural abscess up into the level of T11-T12.  An MRI of the thoracic spine is recently been completed and it shows that the osteomyelitis is confined to the levels of T10-T11 and T12.  There is moderate central canal stenosis secondary to phlegmon in this region.  The patient notes that his legs feel fairly strong while he is at rest however standing on them causes him to feel a bit weak as though they will give way.  He also notes that his bladder control has become more tenuous and that he is having significant urgency his bowel control has been intact.  Past Medical History:  Diagnosis Date   Depression    Diabetes mellitus without complication ([place])     Hypertension    Spinal stenosis    With Neurogenic Claudication    Past Surgical History:  Procedure Laterality Date   CARPAL TUNNEL RELEASE Bilateral    INCISION AND DRAINAGE ABSCESS Left 03/12/2021   Procedure: INCISION AND DRAINAGE ABSCESS;  Surgeon: Marchia Bond, MD;  Location: [facility];  Service: Orthopedics;  Laterality: Left;   IR US GUIDE BX ASP/DRAIN  02/02/2021   IRRIGATION AND DEBRIDEMENT ABSCESS Left 02/08/2021   Procedure: MINOR INCISION AND DRAINAGE OF ABSCESS;  Surgeon: Marchia Bond, MD;  Location: [facility];  Service: Orthopedics;  Laterality: Left;   IRRIGATION AND DEBRIDEMENT ABSCESS Left 02/10/2021   Procedure: IRRIGATION AND DEBRIDEMENT ABSCESS LEFT LATERAL CHEST WALL;  Surgeon: Greer Pickerel, MD;  Location: [facility];  Service: General;  Laterality: Left;   IRRIGATION AND DEBRIDEMENT ABSCESS Bilateral 02/20/2021   Procedure: MINOR INCISION AND DRAINAGE OF ABSCESS/WITH BILATERAL WOUND VAC PLACEMENT;  Surgeon: Marchia Bond, MD;  Location: [facility];  Service: Orthopedics;  Laterality: Bilateral;   IRRIGATION AND DEBRIDEMENT SHOULDER Bilateral 02/28/2021   Procedure: IRRIGATION AND DEBRIDEMENT SHOULDER;  Surgeon: Hiram Gash, MD;  Location: [facility];  Service: Orthopedics;  Laterality: Bilateral;   IRRIGATION AND DEBRIDEMENT SHOULDER Left 03/06/2021   Procedure: IRRIGATION AND DEBRIDEMENT SHOULDER;  Surgeon: Renette Butters, MD;  Location: [facility];  Service: Orthopedics;  Laterality: Left;   IRRIGATION AND DEBRIDEMENT SHOULDER Left 03/08/2021   Procedure: IRRIGATION AND DEBRIDEMENT SHOULDER;  Surgeon: Marchia Bond, MD;  [facility]: [facility];  Service: Orthopedics;  Laterality: Left;   LUMBAR LAMINECTOMY/DECOMPRESSION MICRODISCECTOMY N/A 12/13/2014   Procedure: LUMBAR THREE-FOUR, LUMBAR FOUR-FIVE, LUMBAR FIVE-SACRAL ONE LAMINECTOMY;  Surgeon: [name], MD;  Location: [facility];  Service: Neurosurgery;  Laterality: N/A;  L3-4 L4-5 L5-S1 Laminectomy   LUMBAR LAMINECTOMY/DECOMPRESSION  MICRODISCECTOMY Left 06/05/2021   Procedure: Left lumbar one-two, Lumbar two-three Laminectomy;  Surgeon: [name], MD;  Location: [facility];  Service: Neurosurgery;  Laterality: Left;   MENISCUS REPAIR     Right knee   MINOR APPLICATION OF WOUND VAC Left 02/20/2021   Procedure: MINOR APPLICATION OF KERLIX PACKING/REMOVAL OF WOUND VAC;  Surgeon: [name], MD;  Location: [facility];  Service: General;  Laterality: Left;   TEE WITHOUT CARDIOVERSION N/A 02/05/2021   Procedure: TRANSESOPHAGEAL ECHOCARDIOGRAM (TEE);  Surgeon: Quintella Reichert, MD;  Location: [facility];  Service: Cardiovascular;  Laterality: N/A;   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 His smoking use included cigarettes. He has a 30.00 pack-year smoking history. He has never used smokeless tobacco. He reports current alcohol use. He reports that he does not use drugs.  Allergies:  Allergies  Allergen Reactions   Tizanidine Other (See Comments)    Leg cramps    Medications: I have reviewed the patient's current medications.  Results for orders placed or performed during the hospital encounter of 04/22/22 (from the past 48 hour(s))  Comprehensive metabolic panel     Status: Abnormal   Collection Time: 04/22/22  2:51 PM  Result Value Ref Range   Sodium 137 135 - 145 mmol/L   Potassium 3.9 3.5 - 5.1 mmol/L   Chloride 98 98 - 111 mmol/L   CO2 29 22 - 32 mmol/L   Glucose, Bld 173 (H) 70 - 99 mg/dL    Comment: Glucose reference range applies only to samples taken after fasting for at least 8 hours.   BUN 7 6 - 20 mg/dL    Creatinine, Ser 6.71 0.61 - 1.24 mg/dL   Calcium 9.7 8.9 - 24.5 mg/dL   Total Protein 8.4 (H) 6.5 - 8.1 g/dL   Albumin 3.5 3.5 - 5.0 g/dL   AST 15 15 - 41 U/L   ALT 14 0 - 44 U/L   Alkaline Phosphatase 88 38 - 126 U/L   Total Bilirubin 0.6 0.3 - 1.2 mg/dL   GFR, Estimated >80 >99 mL/min    Comment: (NOTE) Calculated using the CKD-EPI Creatinine Equation (2021)    Anion gap 10 5 - 15    Comment: Performed at [facility], [address], [place], [place] [address]  CBC with Differential     Status: Abnormal   Collection Time: 04/22/22  2:51 PM  Result Value Ref Range   WBC 10.6 (H) 4.0 - 10.5 K/uL   RBC 4.44 4.22 - 5.81 MIL/uL   Hemoglobin 11.4 (L) 13.0 - 17.0 g/dL   HCT 50.5 (L) 39.7 - 67.3 %   MCV 81.3 80.0 - 100.0 fL   MCH 25.7 (  L) 26.0 - 34.0 pg   MCHC 31.6 30.0 - 36.0 g/dL   RDW 53.9 76.7 - 34.1 %   Platelets 700 (H) 150 - 400 K/uL   nRBC 0.0 0.0 - 0.2 %   Neutrophils Relative % 65 %   Neutro Abs 6.9 1.7 - 7.7 K/uL   Lymphocytes Relative 21 %   Lymphs Abs 2.2 0.7 - 4.0 K/uL   Monocytes Relative 7 %   Monocytes Absolute 0.8 0.1 - 1.0 K/uL   Eosinophils Relative 5 %   Eosinophils Absolute 0.6 (H) 0.0 - 0.5 K/uL   Basophils Relative 1 %   Basophils Absolute 0.1 0.0 - 0.1 K/uL   Immature Granulocytes 1 %   Abs Immature Granulocytes 0.06 0.00 - 0.07 K/uL    Comment: Performed at [facility], [address] [place], [place] [address]  Lactic acid, plasma     Status: None   Collection Time: 04/22/22  2:51 PM  Result Value Ref Range   Lactic Acid, Venous 1.4 0.5 - 1.9 mmol/L    Comment: Performed at [facility], [address] [place], [place] [address]  Blood culture (routine x 2)     Status: None (Preliminary result)   Collection Time: 04/22/22  2:52 PM   Specimen: BLOOD  Result Value Ref Range   Specimen Description BLOOD SITE NOT SPECIFIED    Special Requests      BOTTLES DRAWN AEROBIC AND ANAEROBIC Blood Culture adequate volume   Culture       NO GROWTH < 24 HOURS Performed at [facility], [address], [place], [place] [address]    Report Status PENDING   Blood culture (routine x 2)     Status: None (Preliminary result)   Collection Time: 04/22/22  7:24 PM   Specimen: BLOOD  Result Value Ref Range   Specimen Description BLOOD LEFT ANTECUBITAL    Special Requests      BOTTLES DRAWN AEROBIC AND ANAEROBIC Blood Culture results may not be optimal due to an excessive volume of blood received in culture bottles   Culture      NO GROWTH < 12 HOURS Performed at [facility], [address], [place], [place] [address]    Report Status PENDING   Urinalysis, Routine w reflex microscopic Urine, Clean Catch     Status: Abnormal   Collection Time: 04/22/22 11:15 PM  Result Value Ref Range   Color, Urine YELLOW YELLOW   APPearance CLEAR CLEAR   Specific Gravity, Urine 1.009 1.005 - 1.030   pH 5.0 5.0 - 8.0   Glucose, UA NEGATIVE NEGATIVE mg/dL   Hgb urine dipstick SMALL (A) NEGATIVE   Bilirubin Urine NEGATIVE NEGATIVE   Ketones, ur NEGATIVE NEGATIVE mg/dL   Protein, ur NEGATIVE NEGATIVE mg/dL   Nitrite NEGATIVE NEGATIVE   Leukocytes,Ua NEGATIVE NEGATIVE   RBC / HPF 0-5 0 - 5 RBC/hpf   WBC, UA 0-5 0 - 5 WBC/hpf   Bacteria, UA NONE SEEN NONE SEEN    Comment: Performed at [facility], [address], [place], [place] [address]  CBC with Differential/Platelet     Status: Abnormal   Collection Time: 04/23/22  2:57 AM  Result Value Ref Range   WBC 11.0 (H) 4.0 - 10.5 K/uL   RBC 4.16 (L) 4.22 - 5.81 MIL/uL   Hemoglobin 10.6 (L) 13.0 - 17.0 g/dL   HCT 62.2 (L) 29.7 - 98.9 %   MCV 81.5  80.0 - 100.0 fL   MCH 25.5 (L) 26.0 - 34.0 pg   MCHC 31.3 30.0 - 36.0 g/dL   RDW 14.7 11.5 - 15.5 %   Platelets 633 (H) 150 - 400 K/uL   nRBC 0.0 0.0 - 0.2 %   Neutrophils Relative % 64 %   Neutro Abs 7.0 1.7 - 7.7 K/uL   Lymphocytes Relative 22 %   Lymphs Abs 2.5 0.7 - 4.0 K/uL   Monocytes Relative 9 %   Monocytes  Absolute 1.0 0.1 - 1.0 K/uL   Eosinophils Relative 3 %   Eosinophils Absolute 0.4 0.0 - 0.5 K/uL   Basophils Relative 1 %   Basophils Absolute 0.1 0.0 - 0.1 K/uL   Immature Granulocytes 1 %   Abs Immature Granulocytes 0.05 0.00 - 0.07 K/uL    Comment: Performed at [facility] [address], [place], [place] [address]  Comprehensive metabolic panel     Status: Abnormal   Collection Time: 04/23/22  2:57 AM  Result Value Ref Range   Sodium 138 135 - 145 mmol/L   Potassium 4.2 3.5 - 5.1 mmol/L   Chloride 100 98 - 111 mmol/L   CO2 26 22 - 32 mmol/L   Glucose, Bld 141 (H) 70 - 99 mg/dL    Comment: Glucose reference range applies only to samples taken after fasting for at least 8 hours.   BUN 6 6 - 20 mg/dL   Creatinine, Ser 0.61 0.61 - 1.24 mg/dL   Calcium 9.2 8.9 - 10.3 mg/dL   Total Protein 7.1 6.5 - 8.1 g/dL   Albumin 3.1 (L) 3.5 - 5.0 g/dL   AST 13 (L) 15 - 41 U/L   ALT 11 0 - 44 U/L   Alkaline Phosphatase 75 38 - 126 U/L   Total Bilirubin 0.4 0.3 - 1.2 mg/dL   GFR, Estimated >60 >60 mL/min    Comment: (NOTE) Calculated using the CKD-EPI Creatinine Equation (2021)    Anion gap 12 5 - 15    Comment: Performed at [facility] [address], [place], [place] [address]  Magnesium     Status: None   Collection Time: 04/23/22  2:57 AM  Result Value Ref Range   Magnesium 1.7 1.7 - 2.4 mg/dL    Comment: Performed at [facility] [address], [place], [place] [address]  C-reactive protein     Status: Abnormal   Collection Time: 04/23/22  2:57 AM  Result Value Ref Range   CRP 12.4 (H) <1.0 mg/dL    Comment: Performed at [facility] [address], [place], [place] [address]  Sedimentation rate     Status: Abnormal   Collection Time: 04/23/22  2:57 AM  Result Value Ref Range   Sed Rate 47 (H) 0 - 16 mm/hr    Comment: Performed at [facility] [address], [place], [place] [address]  CBG monitoring, ED     Status: Abnormal   Collection  Time: 04/23/22  9:16 AM  Result Value Ref Range   Glucose-Capillary 186 (H) 70 - 99 mg/dL    Comment: Glucose reference range applies only to samples taken after fasting for at least 8 hours.    MR THORACIC SPINE W WO CONTRAST  Result Date: 04/23/2022 CLINICAL DATA:  Osteomyelitis. EXAM: MRI THORACIC WITHOUT AND WITH CONTRAST TECHNIQUE: Multiplanar and multiecho pulse sequences of the thoracic spine were obtained without and with intravenous contrast. CONTRAST:  26mL  GADAVIST GADOBUTROL 1 MMOL/ML IV SOLN COMPARISON:  Lumbar spine MRI dated 1 day prior, thoracic spine MRI 02/15/2021 FINDINGS: Alignment: There is trace retrolisthesis of T3 on T4 which is increased since 02/15/2021. Vertebrae: Vertebral body heights are preserved. Background marrow signal is normal. There is confluent T1 hypointensity with associated STIR hyperintensity and postcontrast enhancement through the entire T10 and T11 vertebral bodies as well as the T10-T11 disc space, and to a lesser degree in the T9 and T12 vertebral bodies consistent with discitis/osteomyelitis. There is enhancement along the ventral epidural space extending from T9-T10 through T12 measuring up to 4 mm, and to a lesser degree along the dorsal epidural space measuring approximately 1 mm in thickness. There is central non enhancement which is more apparent on the sagittal images but is also seen on the axial images at T10-T11 (22-10, 21-33) raising suspicion for abscess. This results in severe spinal canal stenosis at T10-T11 with cord compression but no definite cord signal abnormality, and moderate to severe spinal canal stenosis at T11-T12 also without definite cord signal abnormality. There is enhancement and edema about the left facet joint at T11-T12 consistent with associated septic arthritis. There is extensive enhancement in the paraspinal soft tissues both bilaterally and anteriorly likely reflecting additional phlegmon without evidence of paraspinal  abscess. There is also enhancement in the posterior paraspinal soft tissues on the left at T11-T12 and in the interspinous spaces at T9-T10 through T11-T12. There is obliteration of the intervening disc space at T3-T4 with grade 1 retrolisthesis, likely reflecting sequela of prior discitis/osteomyelitis at this level. There is no signal abnormality or enhancement to suggest osteomyelitis at this level. Cord: As above, there is cord compression at T10-T11 and T11-T12 without definite cord signal abnormality. There is no abnormal cord enhancement. Paraspinal and other soft tissues: As above, there is extensive enhancement in the paraspinal soft tissues at T9 through T11 There is additional edema and enhancement in the posterior soft tissues throughout the midthoracic spine extending from T2 through T7. There is a T2 hyperintense collection in the posterior soft tissues to the left of midline at T11-T12 measuring 2.6 cm TV x 1.2 cm AP x 4.2 cm cc as seen on the lumbar spine MRI from 1 day prior. There is no convincing peripheral enhancement about this collection. Disc levels: There are disc protrusions at C7-T1, T4-T5, T5-T6, T8-T9, and T9-T10 resulting in up to mild spinal canal stenosis with flattening of the ventral cord surface T5-T6 and T8-T9. There is severe spinal canal stenosis at T10-T11 and moderate to severe spinal canal stenosis at T11-T12 due to the above-described abscess. There is multilevel facet arthropathy resulting in up to moderate bilateral neural foraminal stenosis at T10-T11 and T11-T12. IMPRESSION: 1. Findings consistent with discitis/osteomyelitis T9-T10 through T11-T12 (worst at T10-T11) with epidural phlegmon and abscess along the posterior endplates from [id number] through T12 measuring up to 4 mm in thickness, resulting in severe spinal canal stenosis and cord compression but no definite cord signal abnormality. 2. Septic arthritis on the left at T11-T12. 3. Extensive enhancement in the  paraspinal soft tissues at these levels consistent with additional phlegmonous change without evidence of paraspinal abscess. 4. Additional enhancement throughout the posterior soft tissues in the midthoracic spine extending from T2 through T7 without evidence of fluid collection at these levels. 5. Fluid collection in the posterior soft tissues to the left of midline at T12 without convincing peripheral enhancement may reflect a hematoma or seroma, though abscess is not entirely excluded. 6.  Obliteration of the disc space with grade 1 retrolisthesis at T3-T4 likely reflecting sequela of prior discitis/osteomyelitis at this level. Electronically Signed   By: Valetta Mole M.D.   On: 04/23/2022 14:11   MR Lumbar Spine W Wo Contrast  Result Date: 04/22/2022 CLINICAL DATA:  Initial evaluation for back pain, lower extremity weakness. EXAM: MRI LUMBAR SPINE WITHOUT AND WITH CONTRAST TECHNIQUE: Multiplanar and multiecho pulse sequences of the lumbar spine were obtained without and with intravenous contrast. CONTRAST:  27mL GADAVIST GADOBUTROL 1 MMOL/ML IV SOLN COMPARISON:  Prior study from 08/17/2021. FINDINGS: Segmentation: Standard. Lowest well-formed disc space labeled the L5-S1 level. Alignment: Chronic grade 1 anterolisthesis of L5 on S1, stable. Straightening with mild reversal of the normal lumbar lordosis. Vertebrae: Sequelae of prior PLIF at L3 through S1. Interval evolution of previously seen changes of osteomyelitis discitis at L1-2 and L2-3. Mild residual edema within the L1-2 and L2-3 interspaces, improved from prior exam. New findings of probable acute osteomyelitis discitis about the T11-12 interspace, new from prior. Heterogeneous signal intensity within the adjacent ventral epidural space likely reflects epidural phlegmon and/or abscess, seen only on sagittal position (series 5, image 10). Epidural enhancement extends from the lower thoracic spine to approximately the level of T12-L1 (series 12, image  10). Conus medullaris and cauda equina: Conus extends to the L1 level. Conus medullaris within normal limits. Epidural phlegmon/abscess with enhancement extending from the visualized lower thoracic spine to approximately T12-L1. Mild heterogeneity of the proximal cauda equina at the level of L1-2 likely related to prior infection at this level. Remainder of the nerve roots of the cauda equina are within normal limits. Paraspinal and other soft tissues: Persistent edema within the psoas musculature bilaterally. Edema also seen throughout the posterior paraspinous musculature. Benign postoperative collection centered at the laminectomy site of L4-5, stable. Probable soft tissue abscess within the subcutaneous fat of the left back at the level of T12 measures approximately 2.4 x 1.1 x 2.9 cm (series 8, image 1). Disc levels: T11-12: Seen only on sagittal projection. Findings concerning for osteomyelitis discitis. Epidural phlegmon/abscess with enhancement seen within the adjacent epidural space. Bilateral facet arthropathy. Resultant mild spinal stenosis. Moderate to severe bilateral foraminal narrowing. T12-L1: Disc desiccation with reactive endplate spurring. Superimposed right subarticular disc protrusion indents the right ventral thecal sac. Superimposed facet hypertrophy. Resultant mild to moderate right lateral recess stenosis. Central canal remains patent. Foramina remain patent as well. L1-2: Interval evolution of changes related to previously identified osteomyelitis discitis. Associated advanced height loss with endplate irregularity. Moderate bilateral facet hypertrophy. No significant spinal stenosis. Mild to moderate bilateral foraminal narrowing. L2-3: Interval evolution of changes related to osteomyelitis discitis within associated advanced disc space height loss. Moderate bilateral facet hypertrophy. No significant spinal stenosis. Moderate to severe bilateral foraminal stenosis, similar to prior. L3-4:  Prior PLIF. No residual spinal stenosis. Stable foraminal patency. L4-5: Prior PLIF. No residual spinal stenosis. Stable foraminal patency. L5-S1: Prior PLIF. No residual spinal stenosis. Residual mild to moderate bilateral foraminal stenosis related to underlying listhesis, stable. IMPRESSION: 1. New findings consistent with acute osteomyelitis discitis about the T11-12 interspace. Associated epidural phlegmon/abscess extending from the visualized lower thoracic spine to approximately the level of T12-L1. Resultant mild spinal stenosis at the level of T11-12 without frank cord compression. 2. 2.4 x 1.1 x 2.9 cm probable soft tissue abscess within the subcutaneous fat of the left back at the level of T12. 3. Subacute to chronic changes of osteomyelitis discitis at L1-2 and L2-3, mildly  improved in appearance as compared to 08/17/2021. 4. Prior PLIF at L3 through S1 without residual or recurrent spinal stenosis. Electronically Signed   By: Jeannine Boga M.D.   On: 04/22/2022 20:08    Review of Systems  Constitutional:  Positive for activity change.  Musculoskeletal:  Positive for back pain and myalgias.  Neurological:  Positive for weakness.  All other systems reviewed and are negative.  Blood pressure 112/75, pulse (!) 110, temperature 98.2 F (36.8 C), temperature source Oral, resp. rate 18, height 5\' 10"  (1.778 m), weight 76 kg, SpO2 (!) 78 %. Physical Exam Constitutional:      Appearance: Normal appearance. He is normal weight.  HENT:     Head: Normocephalic and atraumatic.     Right Ear: Tympanic membrane, ear canal and external ear normal.     Left Ear: Tympanic membrane, ear canal and external ear normal.     Nose: Nose normal.     Mouth/Throat:     Mouth: Mucous membranes are moist.     Pharynx: Oropharynx is clear.  Eyes:     Conjunctiva/sclera: Conjunctivae normal.     Pupils: Pupils are equal, round, and reactive to light.  Cardiovascular:     Rate and Rhythm: Normal rate  and regular rhythm.     Pulses: Normal pulses.     Heart sounds: Normal heart sounds.  Pulmonary:     Effort: Pulmonary effort is normal.     Breath sounds: Normal breath sounds.  Abdominal:     General: Abdomen is flat. Bowel sounds are normal.     Palpations: Abdomen is soft.  Musculoskeletal:        General: Normal range of motion.     Cervical back: Normal range of motion and neck supple.  Skin:    General: Skin is warm and dry.     Capillary Refill: Capillary refill takes less than 2 seconds.  Neurological:     Mental Status: He is alert.     Comments: He is alert and oriented his pupils are 3 mm and briskly reactive light accommodation extraocular movements are full face symmetric grimace tongue and uvula in the midline sclera and conjunctiva are clear.  The neck reveals no masses no bruits are heard upper extremity strength is intact in the deltoids biceps triceps grips and intrinsics although he does have pain on testing the shoulders.  Lower extremity strength reveals 4 out of 5 strength in iliopsoas quadricep tibialis anterior and gastrocs.  There is brisk hyperreflexia of 3+ in the patellae 1+ in the Achilles Babinski's are indeterminate in either lower extremity.  Does walk with the use of a walker and demonstrates good strength with his lower extremities and short distances in the room.  His left leg does appear to be somewhat weaker than his right on observing his gait.     Assessment/Plan: Osteomyelitis and epidural abscess with phlegmon formation T10-11 T10-11 T12.  Moderate spinal canal compromise.  Plan: The patient feels stable for now in terms of his strength and he has been started on IV antibiotics.  I would suggest that we ask interventional radiology to do a needle aspiration of the T11-T12 disc space to obtain fluid for cultures.  As long as he is as strong as he is now I can continue to monitor him conservatively we will see if he has response to the antibiotics.   Clinically does not appear to be toxic in any way not having any fevers or chills and  his pain is seemingly well controlled at this time.  I will follow along with you should there be any sick significant worsening we can always consider laminectomy and decompression of his thoracic spine at the levels of T10-11 and T11-T12.  John Ware 04/23/2022, 4:42 PM

## 2022-04-23 NOTE — Hospital Course (Signed)
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  Discahrge 06/11/2021 after prolonged hospitalization for MSSA bacteremia, TV endocarditis, lumbar osteomyelitis.  Here now with worsening lower back pain over past 2 weeks.  See below for additional details.  Nsgy and ID c/s

## 2022-04-23 NOTE — Progress Notes (Signed)
Pt got back to MRI for his thoracic w/wo but was in too much pain to lay on the table. RN came and gave meds but pt still unable to tolerate laying on table. RN notified and pt sent back to ER until pain can be better managed.

## 2022-04-23 NOTE — Progress Notes (Signed)
Pharmacy Antibiotic Note  John Ware is a 56 y.o. 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  Pharmacy has been consulted for Vancomycin dosing.  Plan: Initiate loading dose of Vancomycin 1500mg  IV x 1, followed by  Vancomycin 1250mg  IV q12h (eAUC ~485)    > Goal AUC 400-550    > Check vancomycin levels at steady state  Monitor daily CBC, temp, SCr, and for clinical signs of improvement  F/u cultures and de-escalate antibiotics as able     Temp (24hrs), Avg:98.3 F (36.8 C), Min:98.1 F (36.7 C), Max:98.5 F (36.9 C)  Recent Labs  Lab 04/22/22 1451 04/23/22 0257  WBC 10.6* 11.0*  CREATININE 0.65 0.61  LATICACIDVEN 1.4  --     CrCl cannot be calculated (Unknown ideal weight.).    Allergies  Allergen Reactions   Tizanidine Other (See Comments)    Leg cramps    Antimicrobials this admission: Cefazolin 8/28 >> 8/29 (x2 doses) Vancomycin 8/29 >>   Dose adjustments this admission: N/A  Microbiology results: 8/28 BCx: NG < 24h  Thank you for allowing pharmacy to be a part of this patient's care.  9/29, PharmD, BCPS Clinical Pharmacist 04/23/2022 1:56 PM   Please refer to [facility] for pharmacy phone number

## 2022-04-23 NOTE — ED Notes (Signed)
Checked patient cbg it was 44 notified RN of blood sugar patient is resting with call bell in reach

## 2022-04-23 NOTE — Progress Notes (Signed)
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  Discahrge 06/11/2021 after prolonged hospitalization for MSSA bacteremia, TV endocarditis, lumbar osteomyelitis.  Here now with worsening lower back pain over past 2 weeks.  See below for additional details.  Nsgy and ID c/s       Assessment & Plan:   Principal Problem:   Osteomyelitis of vertebra of thoracolumbar region [facility]) Active Problems:   Discitis of thoracolumbar region   DM2 (diabetes mellitus, type 2) (HCC)   Chronic diastolic CHF (congestive heart failure) (HCC)   Chronic pain   Assessment and Plan: * Osteomyelitis of vertebra of thoracolumbar region (HCC) L spine MRI notable for Osteomyelitis discitis about T11-12 interspace, assocaited epidural phlegmon/abscess from visualized lower thoracic spine to approximately T12-L1 (resultant mild spinal stenosis at level of T11-12 without frank cord compression).  Subacute to chronic changes of osteomyelitis discitis at L1-2 and l2-3 mildly improved in appearance as compared to 08/17/2021. T spine MRI with discitis/osteomyelitis T9-10 through T11-12 (worst at T10-11) with epidural phlegmon and abscess along posterior endplates from T9-10 through T12 measuring up to 4 mm in thickenss resulting in severe spinal canal stenosis and cord compression, but no definite cord signal abnormality.  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  Extensive enhancement in paraspinal soft tissues at these levels c/w additional phlegmonous change without evidence of paraspinal abscess.  Enhancement throughout posterior soft tissues in midthoracic spine extending from T2-T7 without evidence of fluid collection at these levels.  Fluid collection in posterior soft  tissues to the L of midline at T12 without convincing peripheral enhancement, may reflect hematoma or seroma (abscess not excluded).  Obliteration of disc space with grade 1 retrolisthesis at T3-4 likely reflecting sequela of prior discitis/osteo at this level.  ID recommending vanc, follow blood cultures, I&D soft tissue Discussed with neurosurgery, planning for IR c/s for aspiration of T11-12 disc space (will ask if they can aspiration soft tissue area as well) - nsgy note pending  abx per ID, follow cx and inflammatory markers   DM2 (diabetes mellitus, type 2) (HCC) a1c 8.4 in 2022, repeat A1c SSI for now  Chronic diastolic CHF (congestive heart failure) (HCC) EF 60-65%, grade 1 diastolic dysfunction Appears euvolemic  Chronic pain Continue home pain meds Baclofen, MS contin, oxycodone IV morphine for breakthrough here     DVT prophylaxis: SCD's for now, holding pharm dvt ppx in anticipation of IR procedure Code Status: full Family Communication: famiyat bedside Disposition:   Status is: Inpatient Remains inpatient appropriate because: need for ID/nsgy c/s   Consultants:  Nsgy ID  Procedures:  none  Antimicrobials:  Anti-infectives (From admission, onward)    Start     Dose/Rate Route Frequency Ordered Stop   04/24/22 0400  vancomycin (VANCOREADY) IVPB 1250 mg/250 mL        1,250 mg 166.7 mL/hr over 90 Minutes Intravenous Every 12 hours 04/23/22 1416     04/23/22 1400  vancomycin (VANCOREADY) IVPB 1500 mg/300 mL        1,500 mg 150 mL/hr over 120 Minutes Intravenous  Once 04/23/22 1349     04/22/22 2200  ceFAZolin (ANCEF) IVPB 2g/100 mL premix  Status:  Discontinued        2 g 200  mL/hr over 30 Minutes Intravenous Every 8 hours 04/22/22 2157 04/23/22 1019       Subjective: No new complaints  Objective: Vitals:   04/23/22 1300 04/23/22 1402 04/23/22 1441 04/23/22 1529  BP:  114/85  112/75  Pulse:  63  (!) 110  Resp:  18    Temp: 98.2 F (36.8 C) 98  F (36.7 C)  98.2 F (36.8 C)  TempSrc: Oral Oral  Oral  SpO2:  100%  (!) 78%  Weight:   76 kg   Height:   5\' 10"  (1.778 m)     Intake/Output Summary (Last 24 hours) at 04/23/2022 1703 Last data filed at 04/23/2022 1018 Gross per 24 hour  Intake 271.83 ml  Output --  Net 271.83 ml   Filed Weights   04/23/22 1441  Weight: 76 kg    Examination:  General exam: Appears calm and comfortable  Respiratory system: unlabored Cardiovascular system: RRR Gastrointestinal system: Abdomen is nondistended, soft and nontender Central nervous system: soft mass on back, nontender - back is not tender to palpation, moving all extremities Extremities: no LEE   Data Reviewed: I have personally reviewed following labs and imaging studies  CBC: Recent Labs  Lab 04/22/22 1451 04/23/22 0257  WBC 10.6* 11.0*  NEUTROABS 6.9 7.0  HGB 11.4* 10.6*  HCT 36.1* 33.9*  MCV 81.3 81.5  PLT 700* 633*    Basic Metabolic Panel: Recent Labs  Lab 04/22/22 1451 04/23/22 0257  NA 137 138  K 3.9 4.2  CL 98 100  CO2 29 26  GLUCOSE 173* 141*  BUN 7 6  CREATININE 0.65 0.61  CALCIUM 9.7 9.2  MG  --  1.7    GFR: Estimated Creatinine Clearance: 109 mL/min (by C-G formula based on SCr of 0.61 mg/dL).  Liver Function Tests: Recent Labs  Lab 04/22/22 1451 04/23/22 0257  AST 15 13*  ALT 14 11  ALKPHOS 88 75  BILITOT 0.6 0.4  PROT 8.4* 7.1  ALBUMIN 3.5 3.1*    CBG: Recent Labs  Lab 04/23/22 0916  GLUCAP 186*     Recent Results (from the past 240 hour(s))  Blood culture (routine x 2)     Status: None (Preliminary result)   Collection Time: 04/22/22  2:52 PM   Specimen: BLOOD  Result Value Ref Range Status   Specimen Description BLOOD SITE NOT SPECIFIED  Final   Special Requests   Final    BOTTLES DRAWN AEROBIC AND ANAEROBIC Blood Culture adequate volume   Culture   Final    NO GROWTH < 24 HOURS Performed at [facility], [place] [address], [place], [place] [address]     Report Status PENDING  Incomplete  Blood culture (routine x 2)     Status: None (Preliminary result)   Collection Time: 04/22/22  7:24 PM   Specimen: BLOOD  Result Value Ref Range Status   Specimen Description BLOOD LEFT ANTECUBITAL  Final   Special Requests   Final    BOTTLES DRAWN AEROBIC AND ANAEROBIC Blood Culture results may not be optimal due to an excessive volume of blood received in culture bottles   Culture   Final    NO GROWTH < 12 HOURS Performed at [facility], [place] [address], [place], [place] [address]    Report Status PENDING  Incomplete         Radiology Studies: MR THORACIC SPINE W WO CONTRAST  Result Date: 04/23/2022 CLINICAL DATA:  Osteomyelitis. EXAM: MRI THORACIC  WITHOUT AND WITH CONTRAST TECHNIQUE: Multiplanar and multiecho pulse sequences of the thoracic spine were obtained without and with intravenous contrast. CONTRAST:  37mL GADAVIST GADOBUTROL 1 MMOL/ML IV SOLN COMPARISON:  Lumbar spine MRI dated 1 day prior, thoracic spine MRI 02/15/2021 FINDINGS: Alignment: There is trace retrolisthesis of T3 on T4 which is increased since 02/15/2021. Vertebrae: Vertebral body heights are preserved. Background marrow signal is normal. There is confluent T1 hypointensity with associated STIR hyperintensity and postcontrast enhancement through the entire T10 and T11 vertebral bodies as well as the T10-T11 disc space, and to Eline Geng lesser degree in the T9 and T12 vertebral bodies consistent with discitis/osteomyelitis. There is enhancement along the ventral epidural space extending from T9-T10 through T12 measuring up to 4 mm, and to Dayjah Selman lesser degree along the dorsal epidural space measuring approximately 1 mm in thickness. There is central non enhancement which is more apparent on the sagittal images but is also seen on the axial images at T10-T11 (22-10, 21-33) raising suspicion for abscess. This results in severe spinal canal stenosis at T10-T11 with cord compression but no definite  cord signal abnormality, and moderate to severe spinal canal stenosis at T11-T12 also without definite cord signal abnormality. There is enhancement and edema about the left facet joint at T11-T12 consistent with associated septic arthritis. There is extensive enhancement in the paraspinal soft tissues both bilaterally and anteriorly likely reflecting additional phlegmon without evidence of paraspinal abscess. There is also enhancement in the posterior paraspinal soft tissues on the left at T11-T12 and in the interspinous spaces at T9-T10 through T11-T12. There is obliteration of the intervening disc space at T3-T4 with grade 1 retrolisthesis, likely reflecting sequela of prior discitis/osteomyelitis at this level. There is no signal abnormality or enhancement to suggest osteomyelitis at this level. Cord: As above, there is cord compression at T10-T11 and T11-T12 without definite cord signal abnormality. There is no abnormal cord enhancement. Paraspinal and other soft tissues: As above, there is extensive enhancement in the paraspinal soft tissues at T9 through T11 There is additional edema and enhancement in the posterior soft tissues throughout the midthoracic spine extending from T2 through T7. There is Dozier Berkovich T2 hyperintense collection in the posterior soft tissues to the left of midline at T11-T12 measuring 2.6 cm TV x 1.2 cm AP x 4.2 cm cc as seen on the lumbar spine MRI from 1 day prior. There is no convincing peripheral enhancement about this collection. Disc levels: There are disc protrusions at C7-T1, T4-T5, T5-T6, T8-T9, and T9-T10 resulting in up to mild spinal canal stenosis with flattening of the ventral cord surface T5-T6 and T8-T9. There is severe spinal canal stenosis at T10-T11 and moderate to severe spinal canal stenosis at T11-T12 due to the above-described abscess. There is multilevel facet arthropathy resulting in up to moderate bilateral neural foraminal stenosis at T10-T11 and T11-T12. IMPRESSION:  1. Findings consistent with discitis/osteomyelitis T9-T10 through T11-T12 (worst at T10-T11) with epidural phlegmon and abscess along the posterior endplates from T9-T10 through T12 measuring up to 4 mm in thickness, resulting in severe spinal canal stenosis and cord compression but no definite cord signal abnormality. 2. Septic arthritis on the left at T11-T12. 3. Extensive enhancement in the paraspinal soft tissues at these levels consistent with additional phlegmonous change without evidence of paraspinal abscess. 4. Additional enhancement throughout the posterior soft tissues in the midthoracic spine extending from T2 through T7 without evidence of fluid collection at these levels. 5. Fluid collection in the posterior soft tissues  to the left of midline at T12 without convincing peripheral enhancement may reflect Dyami Umbach hematoma or seroma, though abscess is not entirely excluded. 6. Obliteration of the disc space with grade 1 retrolisthesis at T3-T4 likely reflecting sequela of prior discitis/osteomyelitis at this level. Electronically Signed   By: Valetta Mole M.D.   On: 04/23/2022 14:11   MR Lumbar Spine W Wo Contrast  Result Date: 04/22/2022 CLINICAL DATA:  Initial evaluation for back pain, lower extremity weakness. EXAM: MRI LUMBAR SPINE WITHOUT AND WITH CONTRAST TECHNIQUE: Multiplanar and multiecho pulse sequences of the lumbar spine were obtained without and with intravenous contrast. CONTRAST:  61mL GADAVIST GADOBUTROL 1 MMOL/ML IV SOLN COMPARISON:  Prior study from 08/17/2021. FINDINGS: Segmentation: Standard. Lowest well-formed disc space labeled the L5-S1 level. Alignment: Chronic grade 1 anterolisthesis of L5 on S1, stable. Straightening with mild reversal of the normal lumbar lordosis. Vertebrae: Sequelae of prior PLIF at L3 through S1. Interval evolution of previously seen changes of osteomyelitis discitis at L1-2 and L2-3. Mild residual edema within the L1-2 and L2-3 interspaces, improved from prior  exam. New findings of probable acute osteomyelitis discitis about the T11-12 interspace, new from prior. Heterogeneous signal intensity within the adjacent ventral epidural space likely reflects epidural phlegmon and/or abscess, seen only on sagittal position (series 5, image 10). Epidural enhancement extends from the lower thoracic spine to approximately the level of T12-L1 (series 12, image 10). Conus medullaris and cauda equina: Conus extends to the L1 level. Conus medullaris within normal limits. Epidural phlegmon/abscess with enhancement extending from the visualized lower thoracic spine to approximately T12-L1. Mild heterogeneity of the proximal cauda equina at the level of L1-2 likely related to prior infection at this level. Remainder of the nerve roots of the cauda equina are within normal limits. Paraspinal and other soft tissues: Persistent edema within the psoas musculature bilaterally. Edema also seen throughout the posterior paraspinous musculature. Benign postoperative collection centered at the laminectomy site of L4-5, stable. Probable soft tissue abscess within the subcutaneous fat of the left back at the level of T12 measures approximately 2.4 x 1.1 x 2.9 cm (series 8, image 1). Disc levels: T11-12: Seen only on sagittal projection. Findings concerning for osteomyelitis discitis. Epidural phlegmon/abscess with enhancement seen within the adjacent epidural space. Bilateral facet arthropathy. Resultant mild spinal stenosis. Moderate to severe bilateral foraminal narrowing. T12-L1: Disc desiccation with reactive endplate spurring. Superimposed right subarticular disc protrusion indents the right ventral thecal sac. Superimposed facet hypertrophy. Resultant mild to moderate right lateral recess stenosis. Central canal remains patent. Foramina remain patent as well. L1-2: Interval evolution of changes related to previously identified osteomyelitis discitis. Associated advanced height loss with endplate  irregularity. Moderate bilateral facet hypertrophy. No significant spinal stenosis. Mild to moderate bilateral foraminal narrowing. L2-3: Interval evolution of changes related to osteomyelitis discitis within associated advanced disc space height loss. Moderate bilateral facet hypertrophy. No significant spinal stenosis. Moderate to severe bilateral foraminal stenosis, similar to prior. L3-4: Prior PLIF. No residual spinal stenosis. Stable foraminal patency. L4-5: Prior PLIF. No residual spinal stenosis. Stable foraminal patency. L5-S1: Prior PLIF. No residual spinal stenosis. Residual mild to moderate bilateral foraminal stenosis related to underlying listhesis, stable. IMPRESSION: 1. New findings consistent with acute osteomyelitis discitis about the T11-12 interspace. Associated epidural phlegmon/abscess extending from the visualized lower thoracic spine to approximately the level of T12-L1. Resultant mild spinal stenosis at the level of T11-12 without frank cord compression. 2. 2.4 x 1.1 x 2.9 cm probable soft tissue abscess within the  subcutaneous fat of the left back at the level of T12. 3. 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 4. Prior PLIF at L3 through S1 without residual or recurrent spinal stenosis. Electronically Signed   By: Jeannine Boga M.D.   On: 04/22/2022 20:08        Scheduled Meds:  baclofen  5 mg Oral TID   gabapentin  100 mg Oral BID   insulin aspart  0-6 Units Subcutaneous TID WC   morphine  45 mg Oral Q12H   Continuous Infusions:  sodium chloride 10 mL/hr at 04/23/22 1629   [START ON 04/24/2022] vancomycin     vancomycin 1,500 mg (04/23/22 1631)     LOS: 1 day    Time spent: over 30 min    Fayrene Helper, MD [facility]   To contact the attending provider between 7A-7P or the covering provider during after hours 7P-7A, please log into the web site [email or web address]  and access using universal [place] password for that web site. If you do not have the password, please call the hospital operator.  04/23/2022, 5:03 PM

## 2022-04-23 NOTE — ED Notes (Signed)
Pt refused his insulin of 1unit. Stating his BG is not high and he doesn't need it. "All its going to do is lower my sugar and I haven't eaten"

## 2022-04-23 NOTE — Progress Notes (Signed)
PHARMACY - PHYSICIAN COMMUNICATION CRITICAL VALUE ALERT - BLOOD CULTURE IDENTIFICATION (BCID)  John Ware is an 56 y.o. male who presented to [facility] on 04/22/2022 with a chief complaint of thoracic osteomeylitis and discitits/epidural abscess previously on doxycycline for MSSA/MRSA infection     Name of physician (or Provider) Contacted: none  Current antibiotics: Vancomycin   Changes to prescribed antibiotics recommended:  No changes needed   Results for orders placed or performed during the hospital encounter of 04/22/22  Blood Culture ID Panel (Reflexed) (Collected: 04/22/2022  7:24 PM)  Result Value Ref Range   Enterococcus faecalis NOT DETECTED NOT DETECTED   Enterococcus Faecium NOT DETECTED NOT DETECTED   Listeria monocytogenes NOT DETECTED NOT DETECTED   Staphylococcus species DETECTED (A) NOT DETECTED   Staphylococcus aureus (BCID) NOT DETECTED NOT DETECTED   Staphylococcus epidermidis DETECTED (A) NOT DETECTED   Staphylococcus lugdunensis NOT DETECTED NOT DETECTED   Streptococcus species NOT DETECTED NOT DETECTED   Streptococcus agalactiae NOT DETECTED NOT DETECTED   Streptococcus pneumoniae NOT DETECTED NOT DETECTED   Streptococcus pyogenes NOT DETECTED NOT DETECTED   A.calcoaceticus-baumannii NOT DETECTED NOT DETECTED   Bacteroides fragilis NOT DETECTED NOT DETECTED   Enterobacterales NOT DETECTED NOT DETECTED   Enterobacter cloacae complex NOT DETECTED NOT DETECTED   Escherichia coli NOT DETECTED NOT DETECTED   Klebsiella aerogenes NOT DETECTED NOT DETECTED   Klebsiella oxytoca NOT DETECTED NOT DETECTED   Klebsiella pneumoniae NOT DETECTED NOT DETECTED   Proteus species NOT DETECTED NOT DETECTED   Salmonella species NOT DETECTED NOT DETECTED   Serratia marcescens NOT DETECTED NOT DETECTED   Haemophilus influenzae NOT DETECTED NOT DETECTED   Neisseria meningitidis NOT DETECTED NOT DETECTED   Pseudomonas aeruginosa NOT DETECTED NOT DETECTED    Stenotrophomonas maltophilia NOT DETECTED NOT DETECTED   Candida albicans NOT DETECTED NOT DETECTED   Candida auris NOT DETECTED NOT DETECTED   Candida glabrata NOT DETECTED NOT DETECTED   Candida krusei NOT DETECTED NOT DETECTED   Candida parapsilosis NOT DETECTED NOT DETECTED   Candida tropicalis NOT DETECTED NOT DETECTED   Cryptococcus neoformans/gattii NOT DETECTED NOT DETECTED   Methicillin resistance mecA/C DETECTED (A) NOT DETECTED     Leota Sauers Pharm.D. CPP, BCPS Clinical Pharmacist [phone] 04/23/2022 8:01 PM

## 2022-04-23 NOTE — Assessment & Plan Note (Signed)
Continue home pain meds Baclofen, MS contin, oxycodone IV morphine for breakthrough here

## 2022-04-23 NOTE — Consult Note (Signed)
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 described below    Referring Provider: Lacretia Nicks, MD   Assessment:  Acute thoracic osteomyelitis and discitis with epidural phlegmon abscess  Soft tissue abscess History of L1-L3 abscess osteomyelitis status post lumbar laminotomy for decompression in October 2022 History of MRSA bacteremia History of MSSA bacteremia with metastatic infection involving bilateral shoulders, lumbar spine involving hardware with osteomyelitis epidural abscess History of pseudomonal superinfection of left shoulder Diabetes mellitus CHF  Plan:  Initiate vancomycin He needs MRI of the thoracic spine Follow-up blood cultures Neurosurgery consult At minimum he should have the soft tissue area I&D for cultures to be sent but still potentially might need neurosurgery and/or IR interventions We will plan on giving him another protracted course of IV antibiotics likely with vancomycin and oral rifampin and resumption of lifelong oral antibiotics--hopefully is not developed tetracycline resistance  Principal Problem:   Osteomyelitis of thoracic spine (HCC) Active Problems:   DM2 (diabetes mellitus, type 2) (HCC)   Chronic diastolic CHF (congestive heart failure) (HCC)   Scheduled Meds:  baclofen  5 mg Oral TID   gabapentin  100 mg Oral BID   insulin aspart  0-6 Units Subcutaneous TID WC   morphine  45 mg Oral Q12H   Continuous Infusions:  sodium chloride Stopped (04/23/22 1018)   lactated ringers 100 mL/hr at 04/23/22 0638   PRN Meds:.sodium chloride, acetaminophen **OR** acetaminophen, morphine injection, naLOXone (NARCAN)  injection, ondansetron (ZOFRAN) IV, oxyCODONE-acetaminophen  HPI: John Ware is a 56 y.o. 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  During that admission he was found to have MRSA bacteremia.  He also had worsening discitis and osteomyelitis at L1-2 and L2-3 with stenosis of L1 and L2 with progressive weakness.  He was taken the operating room by [name] on June 05, 2021 and underwent lumbar laminotomies decompression L1-L2 and L2-L3 on the left with decompression dural tube and nerve roots L1-L2 and 3.   He was initially treated with daptomycin, and then changed to vancomycin and rifampin ultimately transition to chronic indefinite doxycycline therapy and followed very closely by my partner Dr. Elinor Parkinson. He had made tremendous progress and was able to walk again with a cane and then independently.  He was changed to every 39-month appointments and has been doing relatively well but in the past several weeks developed progressively worse back pain.  His wife recounts that he had a very difficult time riding a car trip back and forth to [place] roughly 10 days ago.  He had had progression of his back pain also with numbness involving his left lower extremity to the point where he could no longer walk.  He was brought to the emergency room and underwent MRI of the lumbar spine which shows:  New acute osteomyelitis and discitis in the T11-T12 interspace with an associated epidural phlegmon/abscess that extends from the area of the  thoracic spine visualized T12 and L1 with mild spinal stenosis without cord compression, along with a 2.4  x 1.1 x 2.9 abscess in the subcutaneous tissue of the left back to the level of T12 as well as findings of chronic changes in the involving the L1-L2 L2-L3 discitis and osteomyelitis that are actually improved compared to imaging in December 2022.  PLIF from L3-S1 also visualized.  Attempts were made for an MRI of the thoracic spine but he could not lie flat due to severe pain.  Another attempts are being made to form this image today.  He had blood cultures taken and he was given cefazolin.  We are initiating vancomycin and neurosurgery going to see the patient.  The soft tissue abscess is actually palpable at the bedside and should be amenable to drainage by surgery.  I have never seen the patient before but his strength appears to be relatively intact, though with some left-sided thigh weakness and inability to walk (not clear how much is due to pain vs weakness)  It would be best if he is examined by neurosurgery.   Clearly we need MRI T spine to better elucidate his epidural phlegmon abscess and osteomyelitis in the thoracic spine.  I would suspect that MRSA and or MSSA are the culprits.  He may indeed need another neurosurgical intervention.    I doubt he is bacteremic.  Do think that at minimum he needs the soft tissue abscess approached and culture sent.  Hopefully his MRSA MSSA have not evolved tetracycline resistance.  You are starting vancomycin provided he is not bacteremic we will likely add rifampin again.    I spent  132 minutes with the patient including than 50% of the time in face to face counseling of the patient and his wife regarding his history of metastatic MSSA infection MRSA infection pseudomonal superinfection personally reviewing MRI of the lumbar spine along with review of medical records in preparation for the visit and during the visit and in coordination of his care.         Review of Systems: Review of Systems  Constitutional:  Negative for  chills, fever, malaise/fatigue and weight loss.  HENT:  Negative for congestion and sore throat.   Eyes:  Negative for blurred vision and photophobia.  Respiratory:  Negative for cough, shortness of breath and wheezing.   Cardiovascular:  Negative for chest pain, palpitations and leg swelling.  Gastrointestinal:  Negative for abdominal pain, blood in stool, constipation, diarrhea, heartburn, melena, nausea and vomiting.  Genitourinary:  Negative for dysuria, flank pain and hematuria.  Musculoskeletal:  Positive for back pain and myalgias. Negative for falls and joint pain.  Skin:  Negative for itching and rash.  Neurological:  Positive for focal weakness and weakness. Negative for dizziness, loss of consciousness and headaches.  Endo/Heme/Allergies:  Does not bruise/bleed easily.  Psychiatric/Behavioral:  Negative for depression and suicidal ideas. The patient does not have insomnia.     Past Medical History:  Diagnosis Date   Depression    Diabetes mellitus without complication (HCC)    Hypertension    Spinal stenosis    With Neurogenic Claudication    Social History   Tobacco Use   Smoking status: Former    Packs/day: 2.00    Years: 15.00    Total pack years: 30.00    Types: Cigarettes    Quit date: 2012    Years since quitting: 11.6   Smokeless tobacco: Never  Vaping Use   Vaping  Use: Never used  Substance Use Topics   Alcohol use: Yes    Comment: occasional   Drug use: No    Family History  Problem Relation Age of Onset   Cancer Mother    Colon cancer Neg Hx    Allergies  Allergen Reactions   Tizanidine Other (See Comments)    Leg cramps    OBJECTIVE: Blood pressure 108/75, pulse 67, temperature 98.5 F (36.9 C), temperature source Oral, resp. rate 17, SpO2 99 %.  Physical Exam Constitutional:      Appearance: He is well-developed.  HENT:     Head: Normocephalic and atraumatic.  Eyes:     Conjunctiva/sclera: Conjunctivae normal.  Cardiovascular:      Rate and Rhythm: Normal rate and regular rhythm.     Heart sounds: No murmur heard.    No friction rub. No gallop.  Pulmonary:     Effort: Pulmonary effort is normal. No respiratory distress.     Breath sounds: No stridor. No wheezing or rhonchi.  Abdominal:     General: Bowel sounds are normal. There is no distension.     Palpations: Abdomen is soft.  Musculoskeletal:        General: No tenderness. Normal range of motion.     Cervical back: Normal range of motion and neck supple.  Skin:    General: Skin is warm and dry.     Coloration: Skin is not pale.     Findings: No erythema or rash.  Neurological:     Mental Status: He is alert and oriented to person, place, and time.     Comments: He has weaker flexion in the left thigh but it is still 5/5 Can raise legs off of bed DF, F of toes without difficulty  Psychiatric:        Mood and Affect: Mood normal.        Behavior: Behavior normal.        Thought Content: Thought content normal.        Judgment: Judgment normal.     He has a palpable mass superior to his surgical site which I suspect corresponds to the soft tissue abscess seen on MRI  Lab Results Lab Results  Component Value Date   WBC 11.0 (H) 04/23/2022   HGB 10.6 (L) 04/23/2022   HCT 33.9 (L) 04/23/2022   MCV 81.5 04/23/2022   PLT 633 (H) 04/23/2022    Lab Results  Component Value Date   CREATININE 0.61 04/23/2022   BUN 6 04/23/2022   NA 138 04/23/2022   K 4.2 04/23/2022   CL 100 04/23/2022   CO2 26 04/23/2022    Lab Results  Component Value Date   ALT 11 04/23/2022   AST 13 (L) 04/23/2022   ALKPHOS 75 04/23/2022   BILITOT 0.4 04/23/2022     Microbiology: Recent Results (from the past 240 hour(s))  Blood culture (routine x 2)     Status: None (Preliminary result)   Collection Time: 04/22/22  2:52 PM   Specimen: BLOOD  Result Value Ref Range Status   Specimen Description BLOOD SITE NOT SPECIFIED  Final   Special Requests   Final    BOTTLES  DRAWN AEROBIC AND ANAEROBIC Blood Culture adequate volume   Culture   Final    NO GROWTH < 24 HOURS Performed at [facility], [address], [place], [place] [address]    Report Status PENDING  Incomplete  Blood culture (routine x 2)  Status: None (Preliminary result)   Collection Time: 04/22/22  7:24 PM   Specimen: BLOOD  Result Value Ref Range Status   Specimen Description BLOOD LEFT ANTECUBITAL  Final   Special Requests   Final    BOTTLES DRAWN AEROBIC AND ANAEROBIC Blood Culture results may not be optimal due to an excessive volume of blood received in culture bottles   Culture   Final    NO GROWTH < 12 HOURS Performed at [facility], [address] [place], [place] [address]    Report Status PENDING  Incomplete    Acey Lav, MD [facility] [phone] pager  04/23/2022, 12:41 PM

## 2022-04-23 NOTE — Assessment & Plan Note (Signed)
L spine MRI notable for Osteomyelitis discitis about T11-12 interspace, assocaited epidural phlegmon/abscess from visualized lower thoracic spine to approximately T12-L1 (resultant mild spinal stenosis at level of T11-12 without frank cord compression).  Subacute to chronic changes of osteomyelitis discitis at L1-2 and l2-3 mildly improved in appearance as compared to 08/17/2021. T spine MRI with discitis/osteomyelitis T9-10 through T11-12 (worst at T10-11) with epidural phlegmon and abscess along posterior endplates from T9-10 through T12 measuring up to 4 mm in thickenss resulting in severe spinal canal stenosis and cord compression, but no definite cord signal abnormality.  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  Extensive enhancement in paraspinal soft tissues at these levels c/w additional phlegmonous change without evidence of paraspinal abscess.  Enhancement throughout posterior soft tissues in midthoracic spine extending from T2-T7 without evidence of fluid collection at these levels.  Fluid collection in posterior soft tissues to the L of midline at T12 without convincing peripheral enhancement, may reflect hematoma or seroma (abscess not excluded).  Obliteration of disc space with grade 1 retrolisthesis at T3-4 likely reflecting sequela of prior discitis/osteo at this level.  ID recommending vanc, follow blood cultures, I&D soft tissue Discussed with neurosurgery, planning for IR c/s for aspiration of T11-12 disc space (will ask if they can aspiration soft tissue area as well) - nsgy note pending  abx per ID, follow cx and inflammatory markers

## 2022-04-23 NOTE — Assessment & Plan Note (Signed)
a1c 8.4 in 2022, repeat A1c SSI for now

## 2022-04-23 NOTE — ED Notes (Signed)
Pt in MRI requesting pain meds. Scanner will not scan. Giving Fentanyl IV

## 2022-04-23 NOTE — H&P (Signed)
History and Physical    PLEASE NOTE THAT DRAGON DICTATION SOFTWARE WAS USED IN THE CONSTRUCTION OF THIS NOTE.   [facility] [employer] JAS:[id number] DOB: 03/10/1966 DOA: 04/22/2022  PCP: Sharion Balloon, FNP  Patient coming from: home   I have personally briefly reviewed patient's old medical records in [place]  Chief Complaint: Low back pain  HPI: John Ware is a 56 y.o. male with medical history significant for osteomyelitis of the lumbar spine, chronic diastolic heart failure, type 2 diabetes mellitus, who is admitted to [facility] on 04/22/2022 with acute osteomyelitis/discitis of the thoracic spine after presenting from home to [facility] ED complaining of low back pain.   The patient reports 2 weeks of progressive midline low back pain,, which has been associated with new onset weakness in the bilateral lower extremities, subjectively worse on the left relative to the right lower extremity.  He also notes some numbness involving the left lower extremity, but states that this is chronic.  No recent trauma.  Denies associated subjective fever, chills, rigors, generalized myalgias.  No IV drug use.  No recent chest pain, shortness of breath, palpitations, diaphoresis, dizziness, presyncope, syncope, or any recent lower extremity edema.  Denies any saddle anesthesia, and or any recent urinary retention or fecal incontinence.  Patient with a history of osteomyelitis of the lumbar spine, prompting prolonged hospitalization for at least 8 weeks in 2022 followed by an additional hospitalization for such in October 2022.  During his initial hospitalization in 2022, TEE on 02/05/2021 showed evidence of tricuspid valve vegetation.  He was managed with IV Ancef.  Neurosurgery was involved in the patient's care.  Of note, most recent prior echocardiogram from October 2022 was notable for LVEF 60 to 65%, no focal wall motion abnormalities, grade 1 diastolic dysfunction and normal right  ventricular systolic function and no evidence of valvular pathology, including no overt evidence of valvular vegetation.  He is not on a diuretic medications as an outpatient.    ED Course:  Vital signs in the ED were notable for the following: Afebrile; heart rate 63-78; blood pressure 123/77; respiratory rate 14-18, oxygen saturation 97 to 100% on room air.  Labs were notable for the following: CMP notable for creatinine 0.65, glucose 173, liver enzymes within normal limits.  CBC notable for white blood cell count 10,600.  Lactate 1.4.  Urinalysis notable for no white blood cells and no bacteria.  Blood cultures x2 were collected prior to initiation of IV antibiotics.  Imaging and additional notable ED work-up: MRI of the lumbar spine with and without contrast showed evidence of acute osteomyelitis/discitis involving the T11/T12 interspace, will with additional concern for associated epidural abscess without significant cord compression.   EDP discussed the patient's case and imaging results with on-call neurosurgery,Kim Meyran, who works with Dr. Ellene Route.  Neurosurgery recommends admission to the hospitalist service as well as initiation of IV antibiotics, and conveys that they will formally consult and see the patient in the morning.  They also recommend dedicated MRI of the thoracic spine.  While in the ED, the following were administered: Ativan 1 mg IV x1, Percocet 5/325 mg p.o. x1, Ancef.  Subsequently, the patient was admitted for further evaluation management of acute osteomyelitis/discitis of the T11/T12 interspace with additional concern for associated epidural abscess.    Review of Systems: As per HPI otherwise 10 point review of systems negative.   Past Medical History:  Diagnosis Date   Depression    Diabetes  mellitus without complication ([place])    Hypertension    Spinal stenosis    With Neurogenic Claudication    Past Surgical History:  Procedure Laterality Date   CARPAL  TUNNEL RELEASE Bilateral    INCISION AND DRAINAGE ABSCESS Left 03/12/2021   Procedure: INCISION AND DRAINAGE ABSCESS;  Surgeon: Marchia Bond, MD;  Location: [facility];  Service: Orthopedics;  Laterality: Left;   IR US GUIDE BX ASP/DRAIN  02/02/2021   IRRIGATION AND DEBRIDEMENT ABSCESS Left 02/08/2021   Procedure: MINOR INCISION AND DRAINAGE OF ABSCESS;  Surgeon: Marchia Bond, MD;  Location: [facility];  Service: Orthopedics;  Laterality: Left;   IRRIGATION AND DEBRIDEMENT ABSCESS Left 02/10/2021   Procedure: IRRIGATION AND DEBRIDEMENT ABSCESS LEFT LATERAL CHEST WALL;  Surgeon: Greer Pickerel, MD;  Location: [facility];  Service: General;  Laterality: Left;   IRRIGATION AND DEBRIDEMENT ABSCESS Bilateral 02/20/2021   Procedure: MINOR INCISION AND DRAINAGE OF ABSCESS/WITH BILATERAL WOUND VAC PLACEMENT;  Surgeon: Marchia Bond, MD;  Location: [facility];  Service: Orthopedics;  Laterality: Bilateral;   IRRIGATION AND DEBRIDEMENT SHOULDER Bilateral 02/28/2021   Procedure: IRRIGATION AND DEBRIDEMENT SHOULDER;  Surgeon: Hiram Gash, MD;  Location: [facility];  Service: Orthopedics;  Laterality: Bilateral;   IRRIGATION AND DEBRIDEMENT SHOULDER Left 03/06/2021   Procedure: IRRIGATION AND DEBRIDEMENT SHOULDER;  Surgeon: Renette Butters, MD;  Location: [facility];  Service: Orthopedics;  Laterality: Left;   IRRIGATION AND DEBRIDEMENT SHOULDER Left 03/08/2021   Procedure: IRRIGATION AND DEBRIDEMENT SHOULDER;  Surgeon: Marchia Bond, MD;  Location: [facility];  Service: Orthopedics;  Laterality: Left;   LUMBAR LAMINECTOMY/DECOMPRESSION MICRODISCECTOMY N/A 12/13/2014   Procedure: LUMBAR THREE-FOUR, LUMBAR FOUR-FIVE, LUMBAR FIVE-SACRAL ONE LAMINECTOMY;  Surgeon: Kristeen Miss, MD;  Location: [place] [facility];  Service: Neurosurgery;  Laterality: N/A;  L3-4 L4-5 L5-S1 Laminectomy   LUMBAR LAMINECTOMY/DECOMPRESSION MICRODISCECTOMY Left 06/05/2021   Procedure: Left lumbar one-two, Lumbar two-three Laminectomy;  Surgeon: Kristeen Miss, MD;   Location: [place];  Service: Neurosurgery;  Laterality: Left;   MENISCUS REPAIR     Right knee   MINOR APPLICATION OF WOUND VAC Left 02/20/2021   Procedure: MINOR APPLICATION OF KERLIX PACKING/REMOVAL OF WOUND VAC;  Surgeon: Johnathan Hausen, MD;  Location: [facility];  Service: General;  Laterality: Left;   TEE WITHOUT CARDIOVERSION N/A 02/05/2021   Procedure: TRANSESOPHAGEAL ECHOCARDIOGRAM (TEE);  Surgeon: Sueanne Margarita, MD;  Location: [facility] ENDOSCOPY;  Service: Cardiovascular;  Laterality: N/A;   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 His smoking use included cigarettes. He has a 30.00 pack-year smoking history. He has never used smokeless tobacco. He reports current alcohol use. He reports that he does not use drugs.   Allergies  Allergen Reactions   Tizanidine Other (See Comments)    Leg cramps    Family History  Problem Relation Age of Onset   Cancer Mother    Colon cancer Neg Hx     Family history reviewed and not pertinent    Prior to Admission medications   Medication Sig Start Date End Date Taking? Authorizing Provider  acetaminophen (TYLENOL) 500 MG tablet Take 1 tablet (500 mg total) by mouth every 6 (six) hours as  needed. 06/26/21  Yes Love, Ivan Anchors, PA-C  Baclofen 5 MG TABS TAKE ONE TABLET THREE TIMES DAILY Patient taking differently: Take 1 tablet by mouth 3 (three) times daily. 07/26/21  Yes Lovorn, Jinny Blossom, MD  doxycycline (VIBRA-TABS) 100 MG tablet Take 1 tablet (100 mg total) by mouth 2 (two) times daily. 01/28/22  Yes Rosiland Oz, MD  gabapentin (NEURONTIN) 100 MG capsule TAKE ONE CAPSULE TWICE DAILY Patient taking differently: Take 100 mg by mouth 2 (two) times  daily. 07/26/21  Yes Lovorn, Jinny Blossom, MD  morphine (MS CONTIN) 15 MG 12 hr tablet Take 3 tablets (45 mg total) by mouth every 12 (twelve) hours. 07/09/21  Yes Lovorn, Jinny Blossom, MD  oxyCODONE-acetaminophen (PERCOCET) 10-325 MG tablet Take 1 tablet by mouth every 6 (six) hours as needed (breakthrough pain). 07/09/21  Yes Lovorn, Jinny Blossom, MD  Continuous Blood Gluc Sensor (FREESTYLE LIBRE 2 SENSOR) MISC CHECK BLOOD SUGAR 4 TIMES A DAY 11/28/21   Hawks, Christy A, FNP  naloxone [facility]) 0.4 MG/ML injection Inject 1 mL (0.4 mg total) into the vein as needed. Patient taking differently: Inject 0.4 mg into the vein daily as needed (For overdose). 06/26/21   Bary Leriche, PA-C     Objective    Physical Exam: Vitals:   04/22/22 2127 04/22/22 2200 04/22/22 2300 04/23/22 0515  BP: 119/81 105/65 100/73 (!) 99/58  Pulse: 78 69 67 62  Resp: '16 14 14 16  ' Temp:  98.5 F (36.9 C)    TempSrc:  Oral    SpO2: 96% 97% 98% 100%    General: appears to be stated age; alert, oriented Skin: warm, dry, no rash Head:  AT/[place] Mouth:  Oral mucosa membranes appear moist, normal dentition Neck: supple; trachea midline Heart:  RRR; did not appreciate any M/R/G Lungs: CTAB, did not appreciate any wheezes, rales, or rhonchi Abdomen: + BS; soft, ND, NT Vascular: 2+ pedal pulses b/l; 2+ radial pulses b/l Extremities: no peripheral edema, no muscle wasting Neuro: strength and sensation intact in upper and lower extremities b/l   Labs on Admission: I have personally reviewed following labs and imaging studies  CBC: Recent Labs  Lab 04/22/22 1451 04/23/22 0257  WBC 10.6* 11.0*  NEUTROABS 6.9 7.0  HGB 11.4* 10.6*  HCT 36.1* 33.9*  MCV 81.3 81.5  PLT 700* 098*   Basic Metabolic Panel: Recent Labs  Lab 04/22/22 1451 04/23/22 0257  NA 137 138  K 3.9 4.2  CL 98 100  CO2 29 26  GLUCOSE 173* 141*  BUN 7 6  CREATININE 0.65 0.61  CALCIUM 9.7 9.2  MG  --  1.7   GFR: CrCl cannot be calculated (Unknown ideal  weight.). Liver Function Tests: Recent Labs  Lab 04/22/22 1451 04/23/22 0257  AST 15 13*  ALT 14 11  ALKPHOS 88 75  BILITOT 0.6 0.4  PROT 8.4* 7.1  ALBUMIN 3.5 3.1*   No results for input(s): "LIPASE", "AMYLASE" in the last 168 hours. No results for input(s): "AMMONIA" in the last 168 hours. Coagulation Profile: No results for input(s): "INR", "PROTIME" in the last 168 hours. Cardiac Enzymes: No results for input(s): "CKTOTAL", "CKMB", "CKMBINDEX", "TROPONINI" in the last 168 hours. BNP (last 3 results) No results for input(s): "PROBNP" in the last 8760 hours. HbA1C: No results for input(s): "HGBA1C" in the last 72 hours. CBG: No results for input(s): "GLUCAP" in the last 168 hours. Lipid Profile: No results for input(s): "CHOL", "HDL", "LDLCALC", "TRIG", "CHOLHDL", "LDLDIRECT" in the last 72 hours. Thyroid Function  Tests: No results for input(s): "TSH", "T4TOTAL", "FREET4", "T3FREE", "THYROIDAB" in the last 72 hours. Anemia Panel: No results for input(s): "VITAMINB12", "FOLATE", "FERRITIN", "TIBC", "IRON", "RETICCTPCT" in the last 72 hours. Urine analysis:    Component Value Date/Time   COLORURINE YELLOW 04/22/2022 2315   APPEARANCEUR CLEAR 04/22/2022 2315   LABSPEC 1.009 04/22/2022 2315   PHURINE 5.0 04/22/2022 2315   GLUCOSEU NEGATIVE 04/22/2022 2315   HGBUR SMALL (A) 04/22/2022 2315   BILIRUBINUR NEGATIVE 04/22/2022 2315   KETONESUR NEGATIVE 04/22/2022 2315   PROTEINUR NEGATIVE 04/22/2022 2315   NITRITE NEGATIVE 04/22/2022 2315   LEUKOCYTESUR NEGATIVE 04/22/2022 2315    Radiological Exams on Admission: MR Lumbar Spine W Wo Contrast  Result Date: 04/22/2022 CLINICAL DATA:  Initial evaluation for back pain, lower extremity weakness. EXAM: MRI LUMBAR SPINE WITHOUT AND WITH CONTRAST TECHNIQUE: Multiplanar and multiecho pulse sequences of the lumbar spine were obtained without and with intravenous contrast. CONTRAST:  70m GADAVIST GADOBUTROL 1 MMOL/ML IV SOLN  COMPARISON:  Prior study from 08/17/2021. FINDINGS: Segmentation: Standard. Lowest well-formed disc space labeled the L5-S1 level. Alignment: Chronic grade 1 anterolisthesis of L5 on S1, stable. Straightening with mild reversal of the normal lumbar lordosis. Vertebrae: Sequelae of prior PLIF at L3 through S1. Interval evolution of previously seen changes of osteomyelitis discitis at L1-2 and L2-3. Mild residual edema within the L1-2 and L2-3 interspaces, improved from prior exam. New findings of probable acute osteomyelitis discitis about the T11-12 interspace, new from prior. Heterogeneous signal intensity within the adjacent ventral epidural space likely reflects epidural phlegmon and/or abscess, seen only on sagittal position (series 5, image 10). Epidural enhancement extends from the lower thoracic spine to approximately the level of T12-L1 (series 12, image 10). Conus medullaris and cauda equina: Conus extends to the L1 level. Conus medullaris within normal limits. Epidural phlegmon/abscess with enhancement extending from the visualized lower thoracic spine to approximately T12-L1. Mild heterogeneity of the proximal cauda equina at the level of L1-2 likely related to prior infection at this level. Remainder of the nerve roots of the cauda equina are within normal limits. Paraspinal and other soft tissues: Persistent edema within the psoas musculature bilaterally. Edema also seen throughout the posterior paraspinous musculature. Benign postoperative collection centered at the laminectomy site of L4-5, stable. Probable soft tissue abscess within the subcutaneous fat of the left back at the level of T12 measures approximately 2.4 x 1.1 x 2.9 cm (series 8, image 1). Disc levels: T11-12: Seen only on sagittal projection. Findings concerning for osteomyelitis discitis. Epidural phlegmon/abscess with enhancement seen within the adjacent epidural space. Bilateral facet arthropathy. Resultant mild spinal stenosis.  Moderate to severe bilateral foraminal narrowing. T12-L1: Disc desiccation with reactive endplate spurring. Superimposed right subarticular disc protrusion indents the right ventral thecal sac. Superimposed facet hypertrophy. Resultant mild to moderate right lateral recess stenosis. Central canal remains patent. Foramina remain patent as well. L1-2: Interval evolution of changes related to previously identified osteomyelitis discitis. Associated advanced height loss with endplate irregularity. Moderate bilateral facet hypertrophy. No significant spinal stenosis. Mild to moderate bilateral foraminal narrowing. L2-3: Interval evolution of changes related to osteomyelitis discitis within associated advanced disc space height loss. Moderate bilateral facet hypertrophy. No significant spinal stenosis. Moderate to severe bilateral foraminal stenosis, similar to prior. L3-4: Prior PLIF. No residual spinal stenosis. Stable foraminal patency. L4-5: Prior PLIF. No residual spinal stenosis. Stable foraminal patency. L5-S1: Prior PLIF. No residual spinal stenosis. Residual mild to moderate bilateral foraminal stenosis related to underlying listhesis, stable. IMPRESSION:  1. New findings consistent with acute osteomyelitis discitis about the T11-12 interspace. Associated epidural phlegmon/abscess extending from the visualized lower thoracic spine to approximately the level of T12-L1. Resultant mild spinal stenosis at the level of T11-12 without frank cord compression. 2. 2.4 x 1.1 x 2.9 cm probable soft tissue abscess within the subcutaneous fat of the left back at the level of T12. 3. 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 4. Prior PLIF at L3 through S1 without residual or recurrent spinal stenosis. Electronically Signed   By: Jeannine Boga M.D.   On: 04/22/2022 20:08      Assessment/Plan   Principal Problem:   Osteomyelitis  of thoracic spine (HCC) Active Problems:   DM2 (diabetes mellitus, type 2) (HCC)   Chronic diastolic CHF (congestive heart failure) ([place])      #) Acute osteomyelitis/discitis at the T11/T12 interspace: In the setting of 2 weeks of progressive low back discomfort with associated symmetrical weakness in the bilateral lower extremities, MRI of the lumbar spine shows acute osteomyelitis/discitis of the T11/T12 interspace along with concern for associated epidural abscess, will noting no significant cord compression.  While the patient has mild leukocytosis, no additional surgical.  Met at this time.  Therefore, criteria for sepsis not currently met.  Over will, presentation not associated with red flag signs.  Case discussed with neurosurgery, recommended IV antibiotics, MRI thoracic spine, and will formally consult in the morning.  Blood cultures x2 were collected prior to initiation of Ancef.   Plan: Gentle IV fluids.  Monitor for signs of blood cultures x2.  Continue Ancef.  Neurosurgery consulted.  Repeat CBC with differential in the morning.  Check ESR/CRP.  Prn IV fentanyl.  Check INR.  EKG ordered for preoperative purposes.  MRI of the thoracic spine, per recommendations of neurosurgery, as above.  Fall precautions ordered.             #) Chronic diastolic heart failure: documented history of such, with most recent echocardiogram performed in October 2022 notable for LVEF 60 to 65% as well as grade 1 diastolic dysfunction, as further detailed above. No clinical evidence to suggest acutely decompensated heart failure at this time. home diuretic regimen reportedly consists of the following: None.     Plan: monitor strict I's & O's and daily weights. Repeat BMP in AM. Check serum mag level.            #) Type 2 Diabetes Mellitus: documented history of such.  Managed via lifestyle modifications in the absence of any insulin or oral hypoglycemic agents at home, while noting  most recent) A1c to be 6.6% when checked in June 2023. presenting blood sugar: 173.  Complicated by diabetic peripheral polyneuropathy, on gabapentin at home.   Plan: accuchecks QAC and HS with low dose SSI.  Continue home gabapentin.       DVT prophylaxis: SCD's   Code Status: Full code Family Communication: none Disposition Plan: Per Rounding Team Consults called: EDP consulted neurosurgery, as further detailed above;  Admission status: Inpatient    PLEASE NOTE THAT DRAGON DICTATION SOFTWARE WAS USED IN THE CONSTRUCTION OF THIS NOTE.   [place] DO [facility]  From [place]   04/23/2022, 6:26 AM

## 2022-04-23 NOTE — ED Notes (Signed)
MRI notified that even after the pain medication that pt was unable to lay on MRI table on his back. Advised to send pt back to room and this RN will contact MD about additional meds

## 2022-04-23 NOTE — Assessment & Plan Note (Signed)
EF 60-65%, grade 1 diastolic dysfunction Appears euvolemic

## 2022-04-24 ENCOUNTER — Encounter (HOSPITAL_COMMUNITY): Payer: Self-pay | Admitting: Internal Medicine

## 2022-04-24 ENCOUNTER — Inpatient Hospital Stay (HOSPITAL_COMMUNITY): Payer: Commercial Managed Care - PPO

## 2022-04-24 DIAGNOSIS — E1169 Type 2 diabetes mellitus with other specified complication: Secondary | ICD-10-CM | POA: Diagnosis not present

## 2022-04-24 DIAGNOSIS — M4645 Discitis, unspecified, thoracolumbar region: Secondary | ICD-10-CM | POA: Diagnosis not present

## 2022-04-24 DIAGNOSIS — I5032 Chronic diastolic (congestive) heart failure: Secondary | ICD-10-CM | POA: Diagnosis not present

## 2022-04-24 DIAGNOSIS — M4625 Osteomyelitis of vertebra, thoracolumbar region: Secondary | ICD-10-CM

## 2022-04-24 DIAGNOSIS — M545 Low back pain, unspecified: Secondary | ICD-10-CM

## 2022-04-24 DIAGNOSIS — G062 Extradural and subdural abscess, unspecified: Secondary | ICD-10-CM | POA: Diagnosis not present

## 2022-04-24 HISTORY — PX: IR FLUORO GUIDED NEEDLE PLC ASPIRATION/INJECTION LOC: IMG2395

## 2022-04-24 LAB — PROTIME-INR
INR: 1.1 (ref 0.8–1.2)
Prothrombin Time: 13.7 seconds (ref 11.4–15.2)

## 2022-04-24 LAB — CBC
HCT: 33.8 % — ABNORMAL LOW (ref 39.0–52.0)
Hemoglobin: 10.8 g/dL — ABNORMAL LOW (ref 13.0–17.0)
MCH: 25.7 pg — ABNORMAL LOW (ref 26.0–34.0)
MCHC: 32 g/dL (ref 30.0–36.0)
MCV: 80.5 fL (ref 80.0–100.0)
Platelets: 609 10*3/uL — ABNORMAL HIGH (ref 150–400)
RBC: 4.2 MIL/uL — ABNORMAL LOW (ref 4.22–5.81)
RDW: 14.7 % (ref 11.5–15.5)
WBC: 11.4 10*3/uL — ABNORMAL HIGH (ref 4.0–10.5)
nRBC: 0 % (ref 0.0–0.2)

## 2022-04-24 LAB — BASIC METABOLIC PANEL
Anion gap: 9 (ref 5–15)
BUN: 6 mg/dL (ref 6–20)
CO2: 29 mmol/L (ref 22–32)
Calcium: 9.2 mg/dL (ref 8.9–10.3)
Chloride: 99 mmol/L (ref 98–111)
Creatinine, Ser: 0.59 mg/dL — ABNORMAL LOW (ref 0.61–1.24)
GFR, Estimated: >60 mL/min (ref >60)
Glucose, Bld: 170 mg/dL — ABNORMAL HIGH (ref 70–99)
Potassium: 3.9 mmol/L (ref 3.5–5.1)
Sodium: 137 mmol/L (ref 135–145)

## 2022-04-24 LAB — C-REACTIVE PROTEIN: CRP: 13.4 mg/dL — ABNORMAL HIGH (ref <1.0)

## 2022-04-24 LAB — GLUCOSE, CAPILLARY
Glucose-Capillary: 138 mg/dL — ABNORMAL HIGH (ref 70–99)
Glucose-Capillary: 117 mg/dL — ABNORMAL HIGH (ref 70–99)
Glucose-Capillary: 121 mg/dL — ABNORMAL HIGH (ref 70–99)
Glucose-Capillary: 159 mg/dL — ABNORMAL HIGH (ref 70–99)

## 2022-04-24 LAB — SEDIMENTATION RATE: Sed Rate: 50 mm/hr — ABNORMAL HIGH (ref 0–16)

## 2022-04-24 MED ORDER — FENTANYL CITRATE (PF) 100 MCG/2ML IJ SOLN
INTRAMUSCULAR | Status: AC | PRN
  Administered 2022-04-24 (×2): 50 ug via INTRAVENOUS
  Administered 2022-04-24: 25 ug via INTRAVENOUS
  Administered 2022-04-24: 50 ug via INTRAVENOUS

## 2022-04-24 MED ORDER — MIDAZOLAM HCL 2 MG/2ML IJ SOLN
INTRAMUSCULAR | Status: AC | PRN
  Administered 2022-04-24 (×3): 1 mg via INTRAVENOUS
  Administered 2022-04-24: .5 mg via INTRAVENOUS

## 2022-04-24 MED ORDER — LIDOCAINE HCL (PF) 1 % IJ SOLN
INTRAMUSCULAR | Status: AC
  Administered 2022-04-24: 15 mL
  Filled 2022-04-24: qty 30

## 2022-04-24 MED ORDER — BUPIVACAINE HCL (PF) 0.5 % IJ SOLN
INTRAMUSCULAR | Status: AC
  Administered 2022-04-24: 1 mL
  Filled 2022-04-24: qty 30

## 2022-04-24 MED ORDER — FENTANYL CITRATE (PF) 100 MCG/2ML IJ SOLN
INTRAMUSCULAR | Status: AC
  Filled 2022-04-24: qty 4

## 2022-04-24 MED ORDER — MIDAZOLAM HCL 2 MG/2ML IJ SOLN
INTRAMUSCULAR | Status: AC
  Filled 2022-04-24: qty 4

## 2022-04-24 NOTE — Progress Notes (Signed)
[facility]  PROGRESS NOTE  John Ware:[id number] DOB: 03-Apr-1966 DOA: 04/22/2022 PCP: Junie Spencer, FNP   Brief HPI:   56 year old male with history of tricuspid valve endocarditis, discitis/osteomyelitis, diabetes mellitus type 2, HFpEF presented with progressive low back pain.  He was discharged on 06/11/2021 after prolonged hospitalization for MSSA bacteremia, TV endocarditis, lumbar osteomyelitis.    Subjective   Patient underwent T10-11 disc aspiration per IR today.  Denies pain.   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  Subacute to chronic changes of osteomyelitis discitis at L1-2 and l2-3 mildly improved in appearance as compared to 08/17/2021.  spine MRI with discitis/osteomyelitis T9-10 through T11-12 (worst at T10-11) with epidural phlegmon and abscess along posterior endplates from T9-10 through T12 measuring up to 4 mm in thickenss resulting in severe spinal canal stenosis and cord compression, but no definite cord signal abnormality.  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  Extensive enhancement in paraspinal soft tissues at these levels c/w additional phlegmonous change without evidence of paraspinal abscess.  Enhancement throughout posterior soft tissues in midthoracic spine extending from T2-T7 without evidence of fluid collection at these levels.  Fluid collection in posterior soft tissues to the L of midline at T12 without convincing peripheral enhancement, may reflect hematoma or seroma (abscess not excluded).  Obliteration of disc space with grade 1 retrolisthesis at T3-4 likely reflecting sequela of prior discitis/osteo at this level.   -ID and neurosurgery following -Patient  underwent T10-11 disc aspiration per IR today, follow culture results -Antibiotics per ID  Diabetes mellitus type 2 -Hemoglobin A1c is 8.4 -Continue sliding scale insulin with NovoLog  Chronic diastolic CHF -EF of 60 to 65%, grade 1 diastolic dysfunction -Appears euvolemic  Chronic pain syndrome -Continue home meds -Continue baclofen, MS Contin, oxycodone -Continue IV morphine for breakthrough pain  Medications     baclofen  5 mg Oral TID   fentaNYL       gabapentin  100 mg Oral BID   insulin aspart  0-6 Units Subcutaneous TID WC   midazolam       morphine  45 mg Oral Q12H     Data Reviewed:   CBG:  Recent Labs  Lab 04/23/22 1714 04/23/22 2003 04/24/22 0643 04/24/22 1225 04/24/22 1714  GLUCAP 205* 142* 138* 117* 121*    SpO2: 94 % O2 Flow Rate (L/min): 2 L/min    Vitals:   04/24/22 1145 04/24/22 1150 04/24/22 1215 04/24/22 1553  BP: 124/78 136/85 101/69 113/74  Pulse: 68 70 63 73  Resp: 16 16 16 17   Temp:   98.6 F (37 C) 98.8 F (37.1 C)  TempSrc:   Oral Oral  SpO2: 100% 98% 96% 94%  Weight:      Height:          Data Reviewed:  Basic Metabolic Panel: Recent Labs  Lab 04/22/22 1451 04/23/22 0257 04/24/22 0956  NA 137 138 137  K 3.9 4.2 3.9  CL 98 100 99  CO2 29 26 29   GLUCOSE 173* 141* 170*  BUN 7 6 6   CREATININE 0.65 0.61 0.59*  CALCIUM 9.7 9.2 9.2  MG  --  1.7  --     CBC: Recent Labs  Lab 04/22/22 1451 04/23/22 0257 04/24/22 0956  WBC 10.6* 11.0* 11.4*  NEUTROABS 6.9 7.0  --   HGB 11.4* 10.6* 10.8*  HCT 36.1* 33.9* 33.8*  MCV 81.3 81.5 80.5  PLT 700* 633* 609*    LFT Recent Labs  Lab 04/22/22 1451 04/23/22 0257  AST 15 13*  ALT 14 11  ALKPHOS 88 75  BILITOT 0.6 0.4  PROT 8.4* 7.1  ALBUMIN 3.5 3.1*     Antibiotics: Anti-infectives (From admission, onward)    Start     Dose/Rate Route Frequency Ordered Stop   04/24/22 0400  vancomycin (VANCOREADY) IVPB 1250 mg/250 mL  Status:  Discontinued        1,250  mg 166.7 mL/hr over 90 Minutes Intravenous Every 12 hours 04/23/22 1416 04/24/22 0913   04/23/22 1400  vancomycin (VANCOREADY) IVPB 1500 mg/300 mL        1,500 mg 150 mL/hr over 120 Minutes Intravenous  Once 04/23/22 1349 04/23/22 1831   04/22/22 2200  ceFAZolin (ANCEF) IVPB 2g/100 mL premix  Status:  Discontinued        2 g 200 mL/hr over 30 Minutes Intravenous Every 8 hours 04/22/22 2157 04/23/22 1019        DVT prophylaxis: SCDs  Code Status: Full code  Family Communication: No family at bedside   CONSULTS infectious disease, neurosurgery   Objective    Physical Examination:   General-appears in no acute distress Heart-S1-S2, regular, no murmur auscultated Lungs-clear to auscultation bilaterally, no wheezing or crackles auscultated Abdomen-soft, nontender, no organomegaly Extremities-no edema in the lower extremities Neuro-alert, oriented x3, no focal deficit noted  Status is: Inpatient:             Meredeth Ide   [facility] If 7PM-7AM, please contact night-coverage at [email or web address], Office  [phone]   04/24/2022, 7:29 PM  LOS: 2 days

## 2022-04-24 NOTE — Progress Notes (Signed)
Subjective: No new complaints   Antibiotics:  Anti-infectives (From admission, onward)    Start     Dose/Rate Route Frequency Ordered Stop   04/24/22 0400  vancomycin (VANCOREADY) IVPB 1250 mg/250 mL        1,250 mg 166.7 mL/hr over 90 Minutes Intravenous Every 12 hours 04/23/22 1416     04/23/22 1400  vancomycin (VANCOREADY) IVPB 1500 mg/300 mL        1,500 mg 150 mL/hr over 120 Minutes Intravenous  Once 04/23/22 1349 04/23/22 1831   04/22/22 2200  ceFAZolin (ANCEF) IVPB 2g/100 mL premix  Status:  Discontinued        2 g 200 mL/hr over 30 Minutes Intravenous Every 8 hours 04/22/22 2157 04/23/22 1019       Medications: Scheduled Meds:  baclofen  5 mg Oral TID   gabapentin  100 mg Oral BID   insulin aspart  0-6 Units Subcutaneous TID WC   morphine  45 mg Oral Q12H   Continuous Infusions:  sodium chloride Stopped (04/23/22 1631)   vancomycin 1,250 mg (04/24/22 0421)   PRN Meds:.sodium chloride, acetaminophen **OR** acetaminophen, morphine injection, naLOXone (NARCAN)  injection, ondansetron (ZOFRAN) IV, oxyCODONE-acetaminophen    Objective: Weight change:   Intake/Output Summary (Last 24 hours) at 04/24/2022 0745 Last data filed at 04/24/2022 0616 Gross per 24 hour  Intake 1979.53 ml  Output 800 ml  Net 1179.53 ml   Blood pressure (!) 134/90, pulse 75, temperature 98.8 F (37.1 C), temperature source Oral, resp. rate 17, height 5\' 10"  (1.778 m), weight 76.7 kg, SpO2 100 %. Temp:  [98 F (36.7 C)-98.8 F (37.1 C)] 98.8 F (37.1 C) (08/30 0740) Pulse Rate:  [63-110] 75 (08/30 0740) Resp:  [15-21] 17 (08/30 0740) BP: (102-134)/(63-90) 134/90 (08/30 0740) SpO2:  [78 %-100 %] 100 % (08/30 0740) Weight:  [76 kg-76.7 kg] 76.7 kg (08/30 0423)  Physical Exam: Physical Exam Constitutional:      Appearance: He is well-developed.  HENT:     Head: Normocephalic and atraumatic.  Eyes:     Conjunctiva/sclera: Conjunctivae normal.  Cardiovascular:     Rate  and Rhythm: Normal rate and regular rhythm.  Pulmonary:     Effort: Pulmonary effort is normal. No respiratory distress.     Breath sounds: Normal breath sounds. No stridor. No wheezing.  Abdominal:     General: There is no distension.     Palpations: Abdomen is soft.  Musculoskeletal:        General: Normal range of motion.     Cervical back: Normal range of motion and neck supple.  Skin:    General: Skin is warm and dry.  Neurological:     Mental Status: He is alert and oriented to person, place, and time.  Psychiatric:        Mood and Affect: Mood normal.        Behavior: Behavior normal.        Thought Content: Thought content normal.        Judgment: Judgment normal.      CBC:    BMET Recent Labs    04/22/22 1451 04/23/22 0257  NA 137 138  K 3.9 4.2  CL 98 100  CO2 29 26  GLUCOSE 173* 141*  BUN 7 6  CREATININE 0.65 0.61  CALCIUM 9.7 9.2     Liver Panel  Recent Labs    04/22/22 1451 04/23/22 0257  PROT 8.4* 7.1  ALBUMIN 3.5  3.1*  AST 15 13*  ALT 14 11  ALKPHOS 88 75  BILITOT 0.6 0.4       Sedimentation Rate Recent Labs    04/23/22 0257  ESRSEDRATE 47*   C-Reactive Protein Recent Labs    04/23/22 0257  CRP 12.4*    Micro Results: Recent Results (from the past 720 hour(s))  Blood culture (routine x 2)     Status: None (Preliminary result)   Collection Time: 04/22/22  2:52 PM   Specimen: BLOOD  Result Value Ref Range Status   Specimen Description BLOOD SITE NOT SPECIFIED  Final   Special Requests   Final    BOTTLES DRAWN AEROBIC AND ANAEROBIC Blood Culture adequate volume   Culture   Final    NO GROWTH 2 DAYS Performed at [facility], [address] [place], [place] [address]    Report Status PENDING  Incomplete  Blood culture (routine x 2)     Status: None (Preliminary result)   Collection Time: 04/22/22  7:24 PM   Specimen: BLOOD  Result Value Ref Range Status   Specimen Description BLOOD LEFT ANTECUBITAL  Final    Special Requests   Final    BOTTLES DRAWN AEROBIC AND ANAEROBIC Blood Culture results may not be optimal due to an excessive volume of blood received in culture bottles   Culture  Setup Time   Final    GRAM POSITIVE COCCI IN CLUSTERS IN BOTH AEROBIC AND ANAEROBIC BOTTLES CRITICAL RESULT CALLED TO, READ BACK BY AND VERIFIED WITH: PHARMD LISA CURRAN ON 04/23/22 @ 1951 BY DRT    Culture   Final    NO GROWTH < 12 HOURS Performed at [facility], [address] [place], [place] [address]    Report Status PENDING  Incomplete  Blood Culture ID Panel (Reflexed)     Status: Abnormal   Collection Time: 04/22/22  7:24 PM  Result Value Ref Range Status   Enterococcus faecalis NOT DETECTED NOT DETECTED Final   Enterococcus Faecium NOT DETECTED NOT DETECTED Final   Listeria monocytogenes NOT DETECTED NOT DETECTED Final   Staphylococcus species DETECTED (A) NOT DETECTED Final    Comment: CRITICAL RESULT CALLED TO, READ BACK BY AND VERIFIED WITH: PHARMD LISA CURRAN ON 04/23/22 @ 1951 BY DRT    Staphylococcus aureus (BCID) NOT DETECTED NOT DETECTED Final   Staphylococcus epidermidis DETECTED (A) NOT DETECTED Final    Comment: Methicillin (oxacillin) resistant coagulase negative staphylococcus. Possible blood culture contaminant (unless isolated from more than one blood culture draw or clinical case suggests pathogenicity). No antibiotic treatment is indicated for blood  culture contaminants. CRITICAL RESULT CALLED TO, READ BACK BY AND VERIFIED WITH: PHARMD LISA CURRAN ON 04/23/22 @ 1951 BY DRT    Staphylococcus lugdunensis NOT DETECTED NOT DETECTED Final   Streptococcus species NOT DETECTED NOT DETECTED Final   Streptococcus agalactiae NOT DETECTED NOT DETECTED Final   Streptococcus pneumoniae NOT DETECTED NOT DETECTED Final   Streptococcus pyogenes NOT DETECTED NOT DETECTED Final   A.calcoaceticus-baumannii NOT DETECTED NOT DETECTED Final   Bacteroides fragilis NOT DETECTED NOT DETECTED Final    Enterobacterales NOT DETECTED NOT DETECTED Final   Enterobacter cloacae complex NOT DETECTED NOT DETECTED Final   Escherichia coli NOT DETECTED NOT DETECTED Final   Klebsiella aerogenes NOT DETECTED NOT DETECTED Final   Klebsiella oxytoca NOT DETECTED NOT DETECTED Final   Klebsiella pneumoniae NOT DETECTED NOT DETECTED Final   Proteus species NOT DETECTED NOT DETECTED Final  Salmonella species NOT DETECTED NOT DETECTED Final   Serratia marcescens NOT DETECTED NOT DETECTED Final   Haemophilus influenzae NOT DETECTED NOT DETECTED Final   Neisseria meningitidis NOT DETECTED NOT DETECTED Final   Pseudomonas aeruginosa NOT DETECTED NOT DETECTED Final   Stenotrophomonas maltophilia NOT DETECTED NOT DETECTED Final   Candida albicans NOT DETECTED NOT DETECTED Final   Candida auris NOT DETECTED NOT DETECTED Final   Candida glabrata NOT DETECTED NOT DETECTED Final   Candida krusei NOT DETECTED NOT DETECTED Final   Candida parapsilosis NOT DETECTED NOT DETECTED Final   Candida tropicalis NOT DETECTED NOT DETECTED Final   Cryptococcus neoformans/gattii NOT DETECTED NOT DETECTED Final   Methicillin resistance mecA/C DETECTED (A) NOT DETECTED Final    Comment: CRITICAL RESULT CALLED TO, READ BACK BY AND VERIFIED WITH: PHARMD LISA CURRAN ON 04/23/22 @ 1951 BY DRT Performed at [facility], [address] [place], [place] [address]     Studies/Results: MR THORACIC SPINE W WO CONTRAST  Result Date: 04/23/2022 CLINICAL DATA:  Osteomyelitis. EXAM: MRI THORACIC WITHOUT AND WITH CONTRAST TECHNIQUE: Multiplanar and multiecho pulse sequences of the thoracic spine were obtained without and with intravenous contrast. CONTRAST:  87mL GADAVIST GADOBUTROL 1 MMOL/ML IV SOLN COMPARISON:  Lumbar spine MRI dated 1 day prior, thoracic spine MRI 02/15/2021 FINDINGS: Alignment: There is trace retrolisthesis of T3 on T4 which is increased since 02/15/2021. Vertebrae: Vertebral body heights are preserved.  Background marrow signal is normal. There is confluent T1 hypointensity with associated STIR hyperintensity and postcontrast enhancement through the entire T10 and T11 vertebral bodies as well as the T10-T11 disc space, and to a lesser degree in the T9 and T12 vertebral bodies consistent with discitis/osteomyelitis. There is enhancement along the ventral epidural space extending from T9-T10 through T12 measuring up to 4 mm, and to a lesser degree along the dorsal epidural space measuring approximately 1 mm in thickness. There is central non enhancement which is more apparent on the sagittal images but is also seen on the axial images at T10-T11 (22-10, 21-33) raising suspicion for abscess. This results in severe spinal canal stenosis at T10-T11 with cord compression but no definite cord signal abnormality, and moderate to severe spinal canal stenosis at T11-T12 also without definite cord signal abnormality. There is enhancement and edema about the left facet joint at T11-T12 consistent with associated septic arthritis. There is extensive enhancement in the paraspinal soft tissues both bilaterally and anteriorly likely reflecting additional phlegmon without evidence of paraspinal abscess. There is also enhancement in the posterior paraspinal soft tissues on the left at T11-T12 and in the interspinous spaces at T9-T10 through T11-T12. There is obliteration of the intervening disc space at T3-T4 with grade 1 retrolisthesis, likely reflecting sequela of prior discitis/osteomyelitis at this level. There is no signal abnormality or enhancement to suggest osteomyelitis at this level. Cord: As above, there is cord compression at T10-T11 and T11-T12 without definite cord signal abnormality. There is no abnormal cord enhancement. Paraspinal and other soft tissues: As above, there is extensive enhancement in the paraspinal soft tissues at T9 through T11 There is additional edema and enhancement in the posterior soft tissues  throughout the midthoracic spine extending from T2 through T7. There is a T2 hyperintense collection in the posterior soft tissues to the left of midline at T11-T12 measuring 2.6 cm TV x 1.2 cm AP x 4.2 cm cc as seen on the lumbar spine MRI from 1 day prior. There is no convincing peripheral enhancement  about this collection. Disc levels: There are disc protrusions at C7-T1, T4-T5, T5-T6, T8-T9, and T9-T10 resulting in up to mild spinal canal stenosis with flattening of the ventral cord surface T5-T6 and T8-T9. There is severe spinal canal stenosis at T10-T11 and moderate to severe spinal canal stenosis at T11-T12 due to the above-described abscess. There is multilevel facet arthropathy resulting in up to moderate bilateral neural foraminal stenosis at T10-T11 and T11-T12. IMPRESSION: 1. Findings consistent with discitis/osteomyelitis T9-T10 through T11-T12 (worst at T10-T11) with epidural phlegmon and abscess along the posterior endplates from 579FGE through T12 measuring up to 4 mm in thickness, resulting in severe spinal canal stenosis and cord compression but no definite cord signal abnormality. 2. Septic arthritis on the left at T11-T12. 3. Extensive enhancement in the paraspinal soft tissues at these levels consistent with additional phlegmonous change without evidence of paraspinal abscess. 4. Additional enhancement throughout the posterior soft tissues in the midthoracic spine extending from T2 through T7 without evidence of fluid collection at these levels. 5. Fluid collection in the posterior soft tissues to the left of midline at T12 without convincing peripheral enhancement may reflect a hematoma or seroma, though abscess is not entirely excluded. 6. Obliteration of the disc space with grade 1 retrolisthesis at T3-T4 likely reflecting sequela of prior discitis/osteomyelitis at this level. Electronically Signed   By: Valetta Mole M.D.   On: 04/23/2022 14:11   MR Lumbar Spine W Wo Contrast  Result  Date: 04/22/2022 CLINICAL DATA:  Initial evaluation for back pain, lower extremity weakness. EXAM: MRI LUMBAR SPINE WITHOUT AND WITH CONTRAST TECHNIQUE: Multiplanar and multiecho pulse sequences of the lumbar spine were obtained without and with intravenous contrast. CONTRAST:  3mL GADAVIST GADOBUTROL 1 MMOL/ML IV SOLN COMPARISON:  Prior study from 08/17/2021. FINDINGS: Segmentation: Standard. Lowest well-formed disc space labeled the L5-S1 level. Alignment: Chronic grade 1 anterolisthesis of L5 on S1, stable. Straightening with mild reversal of the normal lumbar lordosis. Vertebrae: Sequelae of prior PLIF at L3 through S1. Interval evolution of previously seen changes of osteomyelitis discitis at L1-2 and L2-3. Mild residual edema within the L1-2 and L2-3 interspaces, improved from prior exam. New findings of probable acute osteomyelitis discitis about the T11-12 interspace, new from prior. Heterogeneous signal intensity within the adjacent ventral epidural space likely reflects epidural phlegmon and/or abscess, seen only on sagittal position (series 5, image 10). Epidural enhancement extends from the lower thoracic spine to approximately the level of T12-L1 (series 12, image 10). Conus medullaris and cauda equina: Conus extends to the L1 level. Conus medullaris within normal limits. Epidural phlegmon/abscess with enhancement extending from the visualized lower thoracic spine to approximately T12-L1. Mild heterogeneity of the proximal cauda equina at the level of L1-2 likely related to prior infection at this level. Remainder of the nerve roots of the cauda equina are within normal limits. Paraspinal and other soft tissues: Persistent edema within the psoas musculature bilaterally. Edema also seen throughout the posterior paraspinous musculature. Benign postoperative collection centered at the laminectomy site of L4-5, stable. Probable soft tissue abscess within the subcutaneous fat of the left back at the level of  T12 measures approximately 2.4 x 1.1 x 2.9 cm (series 8, image 1). Disc levels: T11-12: Seen only on sagittal projection. Findings concerning for osteomyelitis discitis. Epidural phlegmon/abscess with enhancement seen within the adjacent epidural space. Bilateral facet arthropathy. Resultant mild spinal stenosis. Moderate to severe bilateral foraminal narrowing. T12-L1: Disc desiccation with reactive endplate spurring. Superimposed right subarticular disc protrusion indents the  right ventral thecal sac. Superimposed facet hypertrophy. Resultant mild to moderate right lateral recess stenosis. Central canal remains patent. Foramina remain patent as well. L1-2: Interval evolution of changes related to previously identified osteomyelitis discitis. Associated advanced height loss with endplate irregularity. Moderate bilateral facet hypertrophy. No significant spinal stenosis. Mild to moderate bilateral foraminal narrowing. L2-3: Interval evolution of changes related to osteomyelitis discitis within associated advanced disc space height loss. Moderate bilateral facet hypertrophy. No significant spinal stenosis. Moderate to severe bilateral foraminal stenosis, similar to prior. L3-4: Prior PLIF. No residual spinal stenosis. Stable foraminal patency. L4-5: Prior PLIF. No residual spinal stenosis. Stable foraminal patency. L5-S1: Prior PLIF. No residual spinal stenosis. Residual mild to moderate bilateral foraminal stenosis related to underlying listhesis, stable. IMPRESSION: 1. New findings consistent with acute osteomyelitis discitis about the T11-12 interspace. Associated epidural phlegmon/abscess extending from the visualized lower thoracic spine to approximately the level of T12-L1. Resultant mild spinal stenosis at the level of T11-12 without frank cord compression. 2. 2.4 x 1.1 x 2.9 cm probable soft tissue abscess within the subcutaneous fat of the left back at the level of T12. 3. Subacute to chronic changes of  osteomyelitis discitis at L1-2 and L2-3, mildly improved in appearance as compared to 08/17/2021. 4. Prior PLIF at L3 through S1 without residual or recurrent spinal stenosis. Electronically Signed   By: Jeannine Boga M.D.   On: 04/22/2022 20:08      Assessment/Plan:  INTERVAL HISTORY:    This thoracic spine shows discitis and osteomyelitis of T9-T10 and T11-T12 with an epidural phlegmon and abscess along the posterior endplates of 579FGE and 624THL with severe spinal canal stenosis and cord compression septic arthritis at T11-T12 enhancement of the paraspinal soft tissues with phlegmonous changes without discrete abscess with previously visualized fluid collection in the soft tissues on the left of midline at T12  Principal Problem:   Osteomyelitis of vertebra of thoracolumbar region [facility]) Active Problems:   DM2 (diabetes mellitus, type 2) (HCC)   Discitis of thoracolumbar region   Chronic diastolic CHF (congestive heart failure) ([place])   Chronic pain    John Ware is a 56 y.o. 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   During that admission he was found to have MRSA bacteremia.   He also had worsening discitis and osteomyelitis at L1-2 and L2-3 with stenosis of L1 and L2 with progressive weakness.   He was taken the operating room by Dr. Ellene Route on June 05, 2021 and underwent lumbar laminotomies decompression L1-L2 and L2-L3 on the left with decompression  dural tube and nerve roots L1-L2 and 3.    He was initially treated with daptomycin, and then changed to vancomycin and rifampin ultimately transition to chronic indefinite doxycycline therapy  Fortunately he is now readmitted with extensive infection in the thoracic spine with discitis and osteomyelitis that are acute as well as an epidural phlegmon abscess as well as soft tissue abscess.  #1 Acute osteomyelitis discitis with epidural abscess   Appreciate Dr. Ellene Route seeing the patient and certainly neurosurgery is still potentially on the table  but he is going to follow closely.  Agree with IR guided aspirate of 11-12 disc space for cultures.  I would also have the soft tissue area aspirated for culture and if it is certainly purulent I would recommend debridement of this area since it is something that should be easily doable by surgery would defer to Dr. Ellene Route whether he would be wanting to do that himself or having general surgery do that.  In the interim we will continue vancomycin   2 Staphylococcus epidermidis species 1 of 2 admission blood cultures.  This is undoubtably a contaminant.  I spent 51 minutes with the patient including than 50% of the time in face to face counseling of the patient has extensive spine infection personally reviewing right thoracic spine along with review of medical records in preparation for the visit and during the visit and in coordination of his care.  LOS: 2 days   Alcide Evener 04/24/2022, 7:45 AM

## 2022-04-24 NOTE — Progress Notes (Signed)
      INFECTIOUS DISEASE ATTENDING ADDENDUM:   Date: 04/24/2022  Patient name: John Ware  Medical record number: [id number]  Date of birth: 04-10-1966   Greatly appreciate Dr. Verlee Rossetti help  We w ID team have reviewed case and would like to hold his vancomycin for now to hopefully improve yield on cultures from IR guided aspirates and possible debridement. Concern would be if he has tetracycline R Staphylococcus and the vancomycin could obscure this    Acey Lav 04/24/2022, 9:11 AM

## 2022-04-24 NOTE — Care Management (Signed)
  Transition of Care (TOC) Screening Note   Patient Details  Name: John Ware Date of Birth: July 29, 1966   Transition of [facility] CM/SW Contact:    Lawerance Sabal, RN Phone Number: 04/24/2022, 8:19 AM    Transition of Care Department [facility]) has reviewed patient and we will continue to monitor patient advancement through interdisciplinary progression rounds.   Potential need for long term IV Abx at home.

## 2022-04-24 NOTE — Consult Note (Signed)
Chief Complaint: Concern for discitis  Referring Physician(s): Eleonore Chiquito  Supervising Physician: Pedro Earls  Patient Status: [facility] - In-pt  History of Present Illness: John Ware is a 56 y.o. male with medical history significant for osteomyelitis of the lumbar spine, chronic diastolic heart failure, and type 2 diabetes mellitus.  He presented to the ED on 04/22/2022 complaining of back pain.  He was found to have acute osteomyelitis/discitis of the thoracic spine.   He denies associated subjective fever, chills, rigors, generalized myalgias.    He denies any IV drug use.    He does have a history of osteomyelitis of the lumbar spine with prolonged hospitalizations in 2022 followed by an additional hospitalization for such in October 2022.    MRI thoracic spine done yesterday showed= 1. Findings consistent with discitis/osteomyelitis T9-T10 through T11-T12 (worst at T10-T11) with epidural phlegmon and abscess along the posterior endplates from 579FGE through T12 measuring up to 4 mm in thickness, resulting in severe spinal canal stenosis and cord compression but no definite cord signal abnormality. 2. Septic arthritis on the left at T11-T12. 3. Extensive enhancement in the paraspinal soft tissues at these levels consistent with additional phlegmonous change without evidence of paraspinal abscess. 4. Additional enhancement throughout the posterior soft tissues in the midthoracic spine extending from T2 through T7 without evidence of fluid collection at these levels. 5. Fluid collection in the posterior soft tissues to the left of midline at T12 without convincing peripheral enhancement may reflect a hematoma or seroma, though abscess is not entirely excluded. 6. Obliteration of the disc space with grade 1 retrolisthesis at T3-T4 likely reflecting sequela of prior discitis/osteomyelitis at this level.  Those images were reviewed by Dr. Karenann Cai and she feels T10-11 space is best for disc aspiration.  He is NPO.   Past Medical History:  Diagnosis Date   Depression    Diabetes mellitus without complication ([place])    Hypertension    Spinal stenosis    With Neurogenic Claudication    Past Surgical History:  Procedure Laterality Date   CARPAL TUNNEL RELEASE Bilateral    INCISION AND DRAINAGE ABSCESS Left 03/12/2021   Procedure: INCISION AND DRAINAGE ABSCESS;  Surgeon: Marchia Bond, MD;  Location: [facility];  Service: Orthopedics;  Laterality: Left;   IR US GUIDE BX ASP/DRAIN  02/02/2021   IRRIGATION AND DEBRIDEMENT ABSCESS Left 02/08/2021   Procedure: MINOR INCISION AND DRAINAGE OF ABSCESS;  Surgeon: Marchia Bond, MD;  Location: [facility];  Service: Orthopedics;  Laterality: Left;   IRRIGATION AND DEBRIDEMENT ABSCESS Left 02/10/2021   Procedure: IRRIGATION AND DEBRIDEMENT ABSCESS LEFT LATERAL CHEST WALL;  Surgeon: Greer Pickerel, MD;  Location: [facility];  Service: General;  Laterality: Left;   IRRIGATION AND DEBRIDEMENT ABSCESS Bilateral 02/20/2021   Procedure: MINOR INCISION AND DRAINAGE OF ABSCESS/WITH BILATERAL WOUND VAC PLACEMENT;  Surgeon: Marchia Bond, MD;  Location: [facility];  Service: Orthopedics;  Laterality: Bilateral;   IRRIGATION AND DEBRIDEMENT SHOULDER Bilateral 02/28/2021   Procedure: IRRIGATION AND DEBRIDEMENT SHOULDER;  Surgeon: Hiram Gash, MD;  Location: [facility];  Service: Orthopedics;  Laterality: Bilateral;   IRRIGATION AND DEBRIDEMENT SHOULDER Left 03/06/2021   Procedure: IRRIGATION AND DEBRIDEMENT SHOULDER;  Surgeon: Renette Butters, MD;  Location: [facility];  Service: Orthopedics;  Laterality: Left;   IRRIGATION AND DEBRIDEMENT SHOULDER Left 03/08/2021   Procedure: IRRIGATION AND DEBRIDEMENT SHOULDER;  Surgeon: Marchia Bond, MD;  Location: [facility];  Service: Orthopedics;  Laterality:  Left;   LUMBAR LAMINECTOMY/DECOMPRESSION MICRODISCECTOMY N/A 12/13/2014   Procedure: LUMBAR THREE-FOUR, LUMBAR FOUR-FIVE, LUMBAR  FIVE-SACRAL ONE LAMINECTOMY;  Surgeon: Kristeen Miss, MD;  Location: [place] [facility];  Service: Neurosurgery;  Laterality: N/A;  L3-4 L4-5 L5-S1 Laminectomy   LUMBAR LAMINECTOMY/DECOMPRESSION MICRODISCECTOMY Left 06/05/2021   Procedure: Left lumbar one-two, Lumbar two-three Laminectomy;  Surgeon: Kristeen Miss, MD;  Location: [facility];  Service: Neurosurgery;  Laterality: Left;   MENISCUS REPAIR     Right knee   MINOR APPLICATION OF WOUND VAC Left 02/20/2021   Procedure: MINOR APPLICATION OF KERLIX PACKING/REMOVAL OF WOUND VAC;  Surgeon: Johnathan Hausen, MD;  Location: [facility];  Service: General;  Laterality: Left;   TEE WITHOUT CARDIOVERSION N/A 02/05/2021   Procedure: TRANSESOPHAGEAL ECHOCARDIOGRAM (TEE);  Surgeon: Sueanne Margarita, MD;  Location: [facility];  Service: Cardiovascular;  Laterality: N/A;   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 Authorizing Provider  acetaminophen (TYLENOL) 500 MG tablet Take 1 tablet (500 mg total) by mouth every 6 (six) hours as needed. 06/26/21  Yes Love, Ivan Anchors, PA-C  Baclofen 5 MG TABS TAKE ONE TABLET THREE TIMES DAILY Patient taking differently: Take 1 tablet by mouth 3 (three) times daily. 07/26/21  Yes Lovorn, Jinny Blossom, MD  doxycycline (VIBRA-TABS) 100 MG tablet Take 1 tablet (100 mg total) by mouth 2 (two) times daily. 01/28/22  Yes Rosiland Oz, MD  gabapentin (NEURONTIN) 100 MG capsule TAKE ONE CAPSULE TWICE DAILY Patient taking differently: Take 100 mg by mouth 2 (two) times daily. 07/26/21  Yes Lovorn, Jinny Blossom, MD  morphine (MS CONTIN) 15 MG 12 hr tablet Take 3 tablets (45 mg total) by mouth every 12 (twelve)  hours. 07/09/21  Yes Lovorn, Jinny Blossom, MD  oxyCODONE-acetaminophen (PERCOCET) 10-325 MG tablet Take 1 tablet by mouth every 6 (six) hours as needed (breakthrough pain). 07/09/21  Yes Lovorn, Jinny Blossom, MD  Continuous Blood Gluc Sensor (FREESTYLE LIBRE 2 SENSOR) MISC CHECK BLOOD SUGAR 4 TIMES A DAY 11/28/21   Hawks, Christy A, FNP  naloxone [facility]) 0.4 MG/ML injection Inject 1 mL (0.4 mg total) into the vein as needed. Patient taking differently: Inject 0.4 mg into the vein daily as needed (For overdose). 06/26/21   Love, Ivan Anchors, PA-C     Family History  Problem Relation Age of Onset   Cancer Mother    Colon cancer Neg Hx     Social History   Socioeconomic History   Marital status: Married    Spouse name: Not on file   Number of children: Not on file   Years of education: Not on file   Highest education level: Not on file  Occupational History   Not on file  Tobacco Use   Smoking status: Former    Packs/day: 2.00    Years: 15.00    Total pack years: 30.00    Types: Cigarettes    Quit date: 2012    Years since quitting: 11.6   Smokeless tobacco: Never  Vaping Use   Vaping Use: Never used  Substance and Sexual Activity   Alcohol use: Yes    Comment: occasional   Drug use: No  Sexual activity: Not Currently  Other Topics Concern   Not on file  Social History Narrative   Not on file   Social Determinants of Health   Financial Resource Strain: Not on file  Food Insecurity: Not on file  Transportation Needs: Not on file  Physical Activity: Not on file  Stress: Not on file  Social Connections: Not on file     Review of Systems: A 12 point ROS discussed and pertinent positives are indicated in the HPI above.  All other systems are negative.  Review of Systems  Vital Signs: BP (!) 134/90 (BP Location: Right Arm)   Pulse 75   Temp 98.8 F (37.1 C) (Oral)   Resp 17   Ht 5\' 10"  (1.778 m)   Wt 169 lb 1.5 oz (76.7 kg)   SpO2 100%   BMI 24.26 kg/m   Physical  Exam Constitutional:      Appearance: Normal appearance.  HENT:     Head: Normocephalic and atraumatic.  Cardiovascular:     Rate and Rhythm: Normal rate and regular rhythm.  Pulmonary:     Effort: Pulmonary effort is normal. No respiratory distress.     Breath sounds: Normal breath sounds.  Abdominal:     Palpations: Abdomen is soft.  Musculoskeletal:       Back:     Comments: Tenderness and pain  Skin:    General: Skin is warm and dry.  Neurological:     General: No focal deficit present.     Mental Status: He is alert and oriented to person, place, and time.  Psychiatric:        Mood and Affect: Mood normal.        Behavior: Behavior normal.        Thought Content: Thought content normal.        Judgment: Judgment normal.     Imaging: MR THORACIC SPINE W WO CONTRAST  Result Date: 04/23/2022 CLINICAL DATA:  Osteomyelitis. EXAM: MRI THORACIC WITHOUT AND WITH CONTRAST TECHNIQUE: Multiplanar and multiecho pulse sequences of the thoracic spine were obtained without and with intravenous contrast. CONTRAST:  59mL GADAVIST GADOBUTROL 1 MMOL/ML IV SOLN COMPARISON:  Lumbar spine MRI dated 1 day prior, thoracic spine MRI 02/15/2021 FINDINGS: Alignment: There is trace retrolisthesis of T3 on T4 which is increased since 02/15/2021. Vertebrae: Vertebral body heights are preserved. Background marrow signal is normal. There is confluent T1 hypointensity with associated STIR hyperintensity and postcontrast enhancement through the entire T10 and T11 vertebral bodies as well as the T10-T11 disc space, and to a lesser degree in the T9 and T12 vertebral bodies consistent with discitis/osteomyelitis. There is enhancement along the ventral epidural space extending from T9-T10 through T12 measuring up to 4 mm, and to a lesser degree along the dorsal epidural space measuring approximately 1 mm in thickness. There is central non enhancement which is more apparent on the sagittal images but is also seen on  the axial images at T10-T11 (22-10, 21-33) raising suspicion for abscess. This results in severe spinal canal stenosis at T10-T11 with cord compression but no definite cord signal abnormality, and moderate to severe spinal canal stenosis at T11-T12 also without definite cord signal abnormality. There is enhancement and edema about the left facet joint at T11-T12 consistent with associated septic arthritis. There is extensive enhancement in the paraspinal soft tissues both bilaterally and anteriorly likely reflecting additional phlegmon without evidence of paraspinal abscess. There is also enhancement in the posterior paraspinal soft tissues on the  left at T11-T12 and in the interspinous spaces at T9-T10 through T11-T12. There is obliteration of the intervening disc space at T3-T4 with grade 1 retrolisthesis, likely reflecting sequela of prior discitis/osteomyelitis at this level. There is no signal abnormality or enhancement to suggest osteomyelitis at this level. Cord: As above, there is cord compression at T10-T11 and T11-T12 without definite cord signal abnormality. There is no abnormal cord enhancement. Paraspinal and other soft tissues: As above, there is extensive enhancement in the paraspinal soft tissues at T9 through T11 There is additional edema and enhancement in the posterior soft tissues throughout the midthoracic spine extending from T2 through T7. There is a T2 hyperintense collection in the posterior soft tissues to the left of midline at T11-T12 measuring 2.6 cm TV x 1.2 cm AP x 4.2 cm cc as seen on the lumbar spine MRI from 1 day prior. There is no convincing peripheral enhancement about this collection. Disc levels: There are disc protrusions at C7-T1, T4-T5, T5-T6, T8-T9, and T9-T10 resulting in up to mild spinal canal stenosis with flattening of the ventral cord surface T5-T6 and T8-T9. There is severe spinal canal stenosis at T10-T11 and moderate to severe spinal canal stenosis at T11-T12 due  to the above-described abscess. There is multilevel facet arthropathy resulting in up to moderate bilateral neural foraminal stenosis at T10-T11 and T11-T12. IMPRESSION: 1. Findings consistent with discitis/osteomyelitis T9-T10 through T11-T12 (worst at T10-T11) with epidural phlegmon and abscess along the posterior endplates from 579FGE through T12 measuring up to 4 mm in thickness, resulting in severe spinal canal stenosis and cord compression but no definite cord signal abnormality. 2. Septic arthritis on the left at T11-T12. 3. Extensive enhancement in the paraspinal soft tissues at these levels consistent with additional phlegmonous change without evidence of paraspinal abscess. 4. Additional enhancement throughout the posterior soft tissues in the midthoracic spine extending from T2 through T7 without evidence of fluid collection at these levels. 5. Fluid collection in the posterior soft tissues to the left of midline at T12 without convincing peripheral enhancement may reflect a hematoma or seroma, though abscess is not entirely excluded. 6. Obliteration of the disc space with grade 1 retrolisthesis at T3-T4 likely reflecting sequela of prior discitis/osteomyelitis at this level. Electronically Signed   By: Valetta Mole M.D.   On: 04/23/2022 14:11   MR Lumbar Spine W Wo Contrast  Result Date: 04/22/2022 CLINICAL DATA:  Initial evaluation for back pain, lower extremity weakness. EXAM: MRI LUMBAR SPINE WITHOUT AND WITH CONTRAST TECHNIQUE: Multiplanar and multiecho pulse sequences of the lumbar spine were obtained without and with intravenous contrast. CONTRAST:  12mL GADAVIST GADOBUTROL 1 MMOL/ML IV SOLN COMPARISON:  Prior study from 08/17/2021. FINDINGS: Segmentation: Standard. Lowest well-formed disc space labeled the L5-S1 level. Alignment: Chronic grade 1 anterolisthesis of L5 on S1, stable. Straightening with mild reversal of the normal lumbar lordosis. Vertebrae: Sequelae of prior PLIF at L3 through  S1. Interval evolution of previously seen changes of osteomyelitis discitis at L1-2 and L2-3. Mild residual edema within the L1-2 and L2-3 interspaces, improved from prior exam. New findings of probable acute osteomyelitis discitis about the T11-12 interspace, new from prior. Heterogeneous signal intensity within the adjacent ventral epidural space likely reflects epidural phlegmon and/or abscess, seen only on sagittal position (series 5, image 10). Epidural enhancement extends from the lower thoracic spine to approximately the level of T12-L1 (series 12, image 10). Conus medullaris and cauda equina: Conus extends to the L1 level. Conus medullaris within normal limits.  Epidural phlegmon/abscess with enhancement extending from the visualized lower thoracic spine to approximately T12-L1. Mild heterogeneity of the proximal cauda equina at the level of L1-2 likely related to prior infection at this level. Remainder of the nerve roots of the cauda equina are within normal limits. Paraspinal and other soft tissues: Persistent edema within the psoas musculature bilaterally. Edema also seen throughout the posterior paraspinous musculature. Benign postoperative collection centered at the laminectomy site of L4-5, stable. Probable soft tissue abscess within the subcutaneous fat of the left back at the level of T12 measures approximately 2.4 x 1.1 x 2.9 cm (series 8, image 1). Disc levels: T11-12: Seen only on sagittal projection. Findings concerning for osteomyelitis discitis. Epidural phlegmon/abscess with enhancement seen within the adjacent epidural space. Bilateral facet arthropathy. Resultant mild spinal stenosis. Moderate to severe bilateral foraminal narrowing. T12-L1: Disc desiccation with reactive endplate spurring. Superimposed right subarticular disc protrusion indents the right ventral thecal sac. Superimposed facet hypertrophy. Resultant mild to moderate right lateral recess stenosis. Central canal remains  patent. Foramina remain patent as well. L1-2: Interval evolution of changes related to previously identified osteomyelitis discitis. Associated advanced height loss with endplate irregularity. Moderate bilateral facet hypertrophy. No significant spinal stenosis. Mild to moderate bilateral foraminal narrowing. L2-3: Interval evolution of changes related to osteomyelitis discitis within associated advanced disc space height loss. Moderate bilateral facet hypertrophy. No significant spinal stenosis. Moderate to severe bilateral foraminal stenosis, similar to prior. L3-4: Prior PLIF. No residual spinal stenosis. Stable foraminal patency. L4-5: Prior PLIF. No residual spinal stenosis. Stable foraminal patency. L5-S1: Prior PLIF. No residual spinal stenosis. Residual mild to moderate bilateral foraminal stenosis related to underlying listhesis, stable. IMPRESSION: 1. New findings consistent with acute osteomyelitis discitis about the T11-12 interspace. Associated epidural phlegmon/abscess extending from the visualized lower thoracic spine to approximately the level of T12-L1. Resultant mild spinal stenosis at the level of T11-12 without frank cord compression. 2. 2.4 x 1.1 x 2.9 cm probable soft tissue abscess within the subcutaneous fat of the left back at the level of T12. 3. 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 4. Prior PLIF at L3 through S1 without residual or recurrent spinal stenosis. Electronically Signed   By: Rise Mu M.D.   On: 04/22/2022 20:08    Labs:  CBC: Recent Labs    11/08/21 1414 01/28/22 1355 04/22/22 1451 04/23/22 0257  WBC 8.6 8.4 10.6* 11.0*  HGB 13.5 14.0 11.4* 10.6*  HCT 39.9 41.2 36.1* 33.9*  PLT 364 546* 700* 633*    COAGS: No results for input(s): "INR", "APTT" in the last 8760 hours.  BMP: Recent Labs    06/18/21 0326 06/25/21 0313 10/17/21 0404 01/28/22 1355  02/19/22 1211 04/22/22 1451 04/23/22 0257  NA 136 134*   < > 143 138 137 138  K 4.1 4.1   < > 4.4 5.0 3.9 4.2  CL 97* 100   < > 104 99 98 100  CO2 29 28   < > 27 25 29 26   GLUCOSE 121* 86   < > 132* 164* 173* 141*  BUN 7 12   < > 6* 8 7 6   CALCIUM 8.9 9.3   < > 9.8 10.2 9.7 9.2  CREATININE 0.48* 0.55*   < > 0.61* 0.67* 0.65 0.61  GFRNONAA >60 >60  --   --   --  >60 >60   < > = values in this interval not  displayed.    LIVER FUNCTION TESTS: Recent Labs    11/08/21 1414 02/19/22 1211 04/22/22 1451 04/23/22 0257  BILITOT 0.6 0.3 0.6 0.4  AST 13 19 15  13*  ALT 13 14 14 11   ALKPHOS 109 125* 88 75  PROT 7.5 7.6 8.4* 7.1  ALBUMIN 4.3 4.5 3.5 3.1*    TUMOR MARKERS: No results for input(s): "AFPTM", "CEA", "CA199", "CHROMGRNA" in the last 8760 hours.  Assessment and Plan:  Concern for discitis  Will proceed with image guided disc aspiration today by Dr.    Risks and benefits of T10-11 disc aspiration was discussed with the patient and/or patient's family including, but not limited to bleeding, infection, damage to adjacent structures or low yield requiring additional tests.  All of the questions were answered and there is agreement to proceed.  Consent signed and in chart.  Thank you for allowing our service to participate in John Ware 's care.  Electronically Signed: Tommie Sams, PA-C   04/24/2022, 10:09 AM      I spent a total of 40 Minutes in face to face in clinical consultation, greater than 50% of which was counseling/coordinating care for disc aspiration.

## 2022-04-24 NOTE — Progress Notes (Signed)
Patient ID: John Ware, male   DOB: [birth year], 56 y.o.   MRN: [id number] Clinically unchanged.  Reviewed Dr. Lattie Haw note.  I can certainly do a debridement of the superficial collection if this proves to be pus.  We will follow along closely.

## 2022-04-24 NOTE — Procedures (Signed)
INTERVENTIONAL NEURORADIOLOGY BRIEF POSTPROCEDURE NOTE  DIAGNOSTIC CEREBRAL ANGIOGRAM AND MECHANICAL THROMBECTOMY  Attending: Dr. Baldemar Lenis  Diagnosis: T9-T12 discitis/osteomyelitis  Access site: Percutaneous  Anesthesia: Moderate sedation  Medication used: 3.5 mg Versed IV; 175 mcg Fentanyl IV.  Complications: None.  Estimated blood loss: None.  Specimen: 2 FNA samples  Findings: Percutaneous left posterolateral approach utilized for fluoroscopy guided T1-11 disc aspiration. Samples obtained were sent for microbiology analysis.   The patient tolerated the procedure well without incident or complication and is in stable condition.

## 2022-04-25 ENCOUNTER — Other Ambulatory Visit: Payer: Self-pay

## 2022-04-25 ENCOUNTER — Inpatient Hospital Stay (HOSPITAL_COMMUNITY): Payer: Commercial Managed Care - PPO

## 2022-04-25 DIAGNOSIS — M869 Osteomyelitis, unspecified: Secondary | ICD-10-CM | POA: Diagnosis not present

## 2022-04-25 DIAGNOSIS — I5032 Chronic diastolic (congestive) heart failure: Secondary | ICD-10-CM | POA: Diagnosis not present

## 2022-04-25 DIAGNOSIS — M4625 Osteomyelitis of vertebra, thoracolumbar region: Secondary | ICD-10-CM | POA: Diagnosis not present

## 2022-04-25 DIAGNOSIS — M4645 Discitis, unspecified, thoracolumbar region: Secondary | ICD-10-CM | POA: Diagnosis not present

## 2022-04-25 DIAGNOSIS — G8929 Other chronic pain: Secondary | ICD-10-CM | POA: Diagnosis not present

## 2022-04-25 DIAGNOSIS — E1169 Type 2 diabetes mellitus with other specified complication: Secondary | ICD-10-CM | POA: Diagnosis not present

## 2022-04-25 DIAGNOSIS — G062 Extradural and subdural abscess, unspecified: Secondary | ICD-10-CM | POA: Diagnosis not present

## 2022-04-25 LAB — CBC
HCT: 30.8 % — ABNORMAL LOW (ref 39.0–52.0)
Hemoglobin: 10 g/dL — ABNORMAL LOW (ref 13.0–17.0)
MCH: 25.3 pg — ABNORMAL LOW (ref 26.0–34.0)
MCHC: 32.5 g/dL (ref 30.0–36.0)
MCV: 78 fL — ABNORMAL LOW (ref 80.0–100.0)
Platelets: 572 10*3/uL — ABNORMAL HIGH (ref 150–400)
RBC: 3.95 MIL/uL — ABNORMAL LOW (ref 4.22–5.81)
RDW: 14.5 % (ref 11.5–15.5)
WBC: 10.6 10*3/uL — ABNORMAL HIGH (ref 4.0–10.5)
nRBC: 0 % (ref 0.0–0.2)

## 2022-04-25 LAB — BASIC METABOLIC PANEL
Anion gap: 9 (ref 5–15)
BUN: 5 mg/dL — ABNORMAL LOW (ref 6–20)
CO2: 30 mmol/L (ref 22–32)
Calcium: 9.4 mg/dL (ref 8.9–10.3)
Chloride: 99 mmol/L (ref 98–111)
Creatinine, Ser: 0.75 mg/dL (ref 0.61–1.24)
GFR, Estimated: >60 mL/min (ref >60)
Glucose, Bld: 126 mg/dL — ABNORMAL HIGH (ref 70–99)
Potassium: 4.4 mmol/L (ref 3.5–5.1)
Sodium: 138 mmol/L (ref 135–145)

## 2022-04-25 LAB — CULTURE, BLOOD (ROUTINE X 2)

## 2022-04-25 MED ORDER — VANCOMYCIN HCL 1500 MG/300ML IV SOLN
1500.0000 mg | Freq: Once | INTRAVENOUS | Status: AC
  Administered 2022-04-25: 1500 mg via INTRAVENOUS
  Filled 2022-04-25 (×2): qty 300

## 2022-04-25 MED ORDER — SODIUM CHLORIDE 0.9 % IV SOLN
2.0000 g | Freq: Three times a day (TID) | INTRAVENOUS | Status: DC
  Administered 2022-04-25 – 2022-05-08 (×40): 2 g via INTRAVENOUS
  Filled 2022-04-25 (×40): qty 12.5

## 2022-04-25 MED ORDER — VANCOMYCIN HCL 1250 MG/250ML IV SOLN
1250.0000 mg | Freq: Two times a day (BID) | INTRAVENOUS | Status: DC
  Administered 2022-04-26 – 2022-04-28 (×5): 1250 mg via INTRAVENOUS
  Filled 2022-04-25 (×5): qty 250

## 2022-04-25 MED ORDER — LIDOCAINE HCL (PF) 1 % IJ SOLN
INTRAMUSCULAR | Status: AC
  Filled 2022-04-25: qty 30

## 2022-04-25 NOTE — Progress Notes (Signed)
Pt asked this nurse during bedside report when his next dose of breakthrough medication for pain was due, told the pt I would check and let him know.Went in to collect pt VS, told the pt he had percocet for breakthrough pain q6h and he could have it again around 2130, and scheduled morphine at 2200, I overlooked a prn morphine that pt also has available and pt became belligerent after I forgot to name this prn pain medication, stated that I was lying to him on purpose about a medication that he was suppose to get. I apologized to the pt and told him I just overlooked it when I quickly checked his chart earlier during shift change. Told the pt I would get the medication for him at which time he started yelling loudly, cussing, and told me to "go fucking get it then" Security called to room after patient began screaming obscenities and disturbing other  patients on the hall. Medication given to pt while security was present. Pt refused to let me get VS or his BG. Will continue monitoring.

## 2022-04-25 NOTE — Procedures (Signed)
Vascular and Interventional Radiology Procedure Note  Patient: John Ware DOB: 1966/05/07 Medical Record Number: [id number] Note Date/Time: 04/25/22 12:10 PM   Performing Physician: Roanna Banning, MD Assistant(s): None  Diagnosis: T-spine osteomyelitis / discitis  Procedure: ASPIRATION OF LEFT BACK SUPERFICIAL ABSCESS  Anesthesia: Local Anesthetic Complications: None Estimated Blood Loss:  0 mL Specimens: Sent for Gram Stain, Aerobe Culture, and Anerobe Culture  Findings:  Successful Ultrasound-guided ASPIRATION of LEFT back superficial abscess, overlying T10-11 Thick fluid, non-draining. 1 mL of SS drainage aspirated and submitted for microbiology.   See detailed procedure note with images in PACS. The patient tolerated the procedure well without incident or complication and was returned to Floor Bed in stable condition.    Roanna Banning, MD [facility]   Pager. [phone] Clinic. [phone]

## 2022-04-25 NOTE — Progress Notes (Signed)
[facility]  PROGRESS NOTE  John Ware IPJ:[id number] DOB: 06/25/1966 DOA: 04/22/2022 PCP: Junie Spencer, FNP   Brief HPI:   56 year old male with history of tricuspid valve endocarditis, discitis/osteomyelitis, diabetes mellitus type 2, HFpEF presented with progressive low back pain.  He was discharged on 06/11/2021 after prolonged hospitalization for MSSA bacteremia, TV endocarditis, lumbar osteomyelitis.    Subjective   Patient underwent disc aspiration T10-11 yesterday.  Abscess culture growing Pseudomonas.   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  Subacute to chronic changes of osteomyelitis discitis at L1-2 and l2-3 mildly improved in appearance as compared to 08/17/2021.  spine MRI with discitis/osteomyelitis T9-10 through T11-12 (worst at T10-11) with epidural phlegmon and abscess along posterior endplates from T9-10 through T12 measuring up to 4 mm in thickenss resulting in severe spinal canal stenosis and cord compression, but no definite cord signal abnormality.  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  Extensive enhancement in paraspinal soft tissues at these levels c/w additional phlegmonous change without evidence of paraspinal abscess.  Enhancement throughout posterior soft tissues in midthoracic spine extending from T2-T7 without evidence of fluid collection at these levels.  Fluid collection in posterior soft tissues to the L of midline at T12 without convincing peripheral enhancement, may reflect hematoma or seroma (abscess not excluded).  Obliteration of disc space with grade 1 retrolisthesis at T3-4 likely reflecting sequela of prior discitis/osteo at this level.   -ID and neurosurgery  following -Patient underwent T10-11 disc aspiration per IR yesterday -Culture growing Pseudomonas -Patient is currently on vancomycin and cefepime  Diabetes mellitus type 2 -Hemoglobin A1c is 8.4 -Continue sliding scale insulin with NovoLog  Chronic diastolic CHF -EF of 60 to 65%, grade 1 diastolic dysfunction -Appears euvolemic  Chronic pain syndrome -Continue home meds -Continue baclofen, MS Contin, oxycodone -Continue IV morphine for breakthrough pain  Medications     baclofen  5 mg Oral TID   gabapentin  100 mg Oral BID   insulin aspart  0-6 Units Subcutaneous TID WC   morphine  45 mg Oral Q12H     Data Reviewed:   CBG:  Recent Labs  Lab 04/23/22 2003 04/24/22 0643 04/24/22 1225 04/24/22 1714 04/24/22 2156  GLUCAP 142* 138* 117* 121* 159*    SpO2: 98 % O2 Flow Rate (L/min): 2 L/min    Vitals:   04/25/22 0000 04/25/22 0404 04/25/22 0737 04/25/22 1228  BP: 115/80 117/83 122/76 96/64  Pulse: 91 74 78 79  Resp: 18 16 16 16   Temp: 98.5 F (36.9 C) 98.8 F (37.1 C) 99.9 F (37.7 C) 98.4 F (36.9 C)  TempSrc: Oral Oral Oral Oral  SpO2: 100% 98% 96% 98%  Weight:      Height:          Data Reviewed:  Basic Metabolic Panel: Recent Labs  Lab 04/22/22 1451 04/23/22 0257 04/24/22 0956 04/25/22 0412  NA 137 138 137 138  K 3.9 4.2 3.9 4.4  CL 98 100 99 99  CO2 29 26 29 30   GLUCOSE 173* 141* 170* 126*  BUN 7 6 6  5*  CREATININE 0.65 0.61 0.59* 0.75  CALCIUM 9.7 9.2 9.2 9.4  MG  --  1.7  --   --     CBC: Recent Labs  Lab 04/22/22 1451  04/23/22 0257 04/24/22 0956 04/25/22 0412  WBC 10.6* 11.0* 11.4* 10.6*  NEUTROABS 6.9 7.0  --   --   HGB 11.4* 10.6* 10.8* 10.0*  HCT 36.1* 33.9* 33.8* 30.8*  MCV 81.3 81.5 80.5 78.0*  PLT 700* 633* 609* 572*    LFT Recent Labs  Lab 04/22/22 1451 04/23/22 0257  AST 15 13*  ALT 14 11  ALKPHOS 88 75  BILITOT 0.6 0.4  PROT 8.4* 7.1  ALBUMIN 3.5 3.1*     Antibiotics: Anti-infectives (From  admission, onward)    Start     Dose/Rate Route Frequency Ordered Stop   04/26/22 0200  vancomycin (VANCOREADY) IVPB 1250 mg/250 mL        1,250 mg 166.7 mL/hr over 90 Minutes Intravenous Every 12 hours 04/25/22 1250     04/25/22 1400  ceFEPIme (MAXIPIME) 2 g in sodium chloride 0.9 % 100 mL IVPB        2 g 200 mL/hr over 30 Minutes Intravenous Every 8 hours 04/25/22 1236     04/25/22 1345  vancomycin (VANCOREADY) IVPB 1500 mg/300 mL        1,500 mg 150 mL/hr over 120 Minutes Intravenous  Once 04/25/22 1250 04/25/22 1558   04/24/22 0400  vancomycin (VANCOREADY) IVPB 1250 mg/250 mL  Status:  Discontinued        1,250 mg 166.7 mL/hr over 90 Minutes Intravenous Every 12 hours 04/23/22 1416 04/24/22 0913   04/23/22 1400  vancomycin (VANCOREADY) IVPB 1500 mg/300 mL        1,500 mg 150 mL/hr over 120 Minutes Intravenous  Once 04/23/22 1349 04/23/22 1831   04/22/22 2200  ceFAZolin (ANCEF) IVPB 2g/100 mL premix  Status:  Discontinued        2 g 200 mL/hr over 30 Minutes Intravenous Every 8 hours 04/22/22 2157 04/23/22 1019        DVT prophylaxis: SCDs  Code Status: Full code  Family Communication: No family at bedside   CONSULTS infectious disease, neurosurgery   Objective    Physical Examination:  General-appears in no acute distress Heart-S1-S2, regular, no murmur auscultated Lungs-clear to auscultation bilaterally, no wheezing or crackles auscultated Abdomen-soft, nontender, no organomegaly Extremities-no edema in the lower extremities Neuro-alert, oriented x3, no focal deficit noted  Status is: Inpatient:             Meredeth Ide   [facility] If 7PM-7AM, please contact night-coverage at [email or web address], Office  [phone]   04/25/2022, 4:12 PM  LOS: 3 days

## 2022-04-25 NOTE — Progress Notes (Signed)
Patient ID: John Ware, male   DOB: 03-19-1966, 56 y.o.   MRN: [id number] Patient's vital signs are stable Clinically he is feeling well notes perhaps a little better in terms of back pain.  Does not feel his leg strength is changing any and today briskly was able to get up from a seated position and maintain his balance on his legs.  I examined his back and out the paraspinous mass that is been aspirated today.  Apparently some thick pus was obtained.  Pseudomonas is growing from the disc space aspiration done yesterday.  In light of the fact that he is clinically stable at this time hard put to suggest any surgical intervention I discussed this with him today and I believe that we can continue to treat him with IV antibiotics and look for his clinical response.  The subcutaneous abscess on his back should resorb itself fairly quickly if it is responding to antibiotics we can follow this clinically.  I will be out of town for this weekend but certainly should there be any change or worsening any of my partners will be available to see him over the weekend.  I will reevaluate him on Tuesday.

## 2022-04-25 NOTE — Progress Notes (Signed)
Pharmacy Antibiotic Note  John Ware is a 56 y.o. 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 Also history of pseudomonas isolated from the shoulder in October of 2022. Patient is now growing pseudomonas from his disc culture. Pharmacy has been consulted for Vancomycin and cefepime dosing.  Plan: Initiate loading dose of Vancomycin 1500mg  IV x 1, followed by  START Vancomycin 1250mg  IV q12h (eAUC ~477)    > Goal AUC 400-550    > Check vancomycin levels at steady state  START Cefepime 2g IV Q8H Monitor daily CBC, temp, SCr, and for clinical signs of improvement  F/u cultures and de-escalate antibiotics as able  Height: 5\' 10"  (177.8 cm) Weight: 76.7 kg (169 lb 1.5 oz) IBW/kg (Calculated) : 73  Temp (24hrs), Avg:98.9 F (37.2 C), Min:98.4 F (36.9 C), Max:99.9 F (37.7 C)  Recent Labs  Lab 04/22/22 1451 04/23/22 0257 04/24/22 0956 04/25/22 0412  WBC 10.6* 11.0* 11.4* 10.6*  CREATININE 0.65 0.61 0.59* 0.75  LATICACIDVEN 1.4  --   --   --      Estimated Creatinine Clearance: 109 mL/min (by C-G formula based on SCr of 0.75 mg/dL).    Allergies  Allergen Reactions   Tizanidine Other (See Comments)    Leg cramps    Antimicrobials this admission: Cefazolin 8/28 >> 8/29 (x2 doses) Vancomycin 8/29 >> 8/30; 8/31>> Cefepime 8/31>>  Dose adjustments this admission: N/A  Microbiology results: 8/28 BCx: staph pseudintermedius, staph epidermidis (likely contaminant)  8/30 Disc cx: pseudomonas aeruginosa  8/31 abscess cx: NGTD   9/28, PharmD PGY-2 Infectious Diseases Resident  04/25/2022 12:45 PM

## 2022-04-25 NOTE — Progress Notes (Signed)
Subjective: No new complaints  Antibiotics:  Anti-infectives (From admission, onward)    Start     Dose/Rate Route Frequency Ordered Stop   04/26/22 0200  vancomycin (VANCOREADY) IVPB 1250 mg/250 mL        1,250 mg 166.7 mL/hr over 90 Minutes Intravenous Every 12 hours 04/25/22 1250     04/25/22 1400  ceFEPIme (MAXIPIME) 2 g in sodium chloride 0.9 % 100 mL IVPB        2 g 200 mL/hr over 30 Minutes Intravenous Every 8 hours 04/25/22 1236     04/25/22 1345  vancomycin (VANCOREADY) IVPB 1500 mg/300 mL        1,500 mg 150 mL/hr over 120 Minutes Intravenous  Once 04/25/22 1250     04/24/22 0400  vancomycin (VANCOREADY) IVPB 1250 mg/250 mL  Status:  Discontinued        1,250 mg 166.7 mL/hr over 90 Minutes Intravenous Every 12 hours 04/23/22 1416 04/24/22 0913   04/23/22 1400  vancomycin (VANCOREADY) IVPB 1500 mg/300 mL        1,500 mg 150 mL/hr over 120 Minutes Intravenous  Once 04/23/22 1349 04/23/22 1831   04/22/22 2200  ceFAZolin (ANCEF) IVPB 2g/100 mL premix  Status:  Discontinued        2 g 200 mL/hr over 30 Minutes Intravenous Every 8 hours 04/22/22 2157 04/23/22 1019       Medications: Scheduled Meds:  baclofen  5 mg Oral TID   gabapentin  100 mg Oral BID   insulin aspart  0-6 Units Subcutaneous TID WC   morphine  45 mg Oral Q12H   Continuous Infusions:  sodium chloride Stopped (04/23/22 1631)   ceFEPime (MAXIPIME) IV 2 g (04/25/22 1316)   [START ON 04/26/2022] vancomycin     vancomycin 1,500 mg (04/25/22 1358)   PRN Meds:.sodium chloride, acetaminophen **OR** acetaminophen, morphine injection, naLOXone (NARCAN)  injection, ondansetron (ZOFRAN) IV, oxyCODONE-acetaminophen    Objective: Weight change:  No intake or output data in the 24 hours ending 04/25/22 1554  Blood pressure 96/64, pulse 79, temperature 98.4 F (36.9 C), temperature source Oral, resp. rate 16, height 5\' 10"  (1.778 m), weight 76.7 kg, SpO2 98 %. Temp:  [98.4 F (36.9 C)-99.9 F  (37.7 C)] 98.4 F (36.9 C) (08/31 1228) Pulse Rate:  [74-91] 79 (08/31 1228) Resp:  [16-18] 16 (08/31 1228) BP: (96-135)/(64-95) 96/64 (08/31 1228) SpO2:  [96 %-100 %] 98 % (08/31 1228)  Physical Exam: Physical Exam Constitutional:      Appearance: He is well-developed.  HENT:     Head: Normocephalic and atraumatic.  Eyes:     Conjunctiva/sclera: Conjunctivae normal.  Cardiovascular:     Rate and Rhythm: Normal rate and regular rhythm.  Pulmonary:     Effort: Pulmonary effort is normal. No respiratory distress.     Breath sounds: No wheezing.  Abdominal:     General: There is no distension.     Palpations: Abdomen is soft.  Musculoskeletal:     Cervical back: Normal range of motion and neck supple.  Skin:    General: Skin is warm and dry.     Findings: No erythema or rash.  Neurological:     Mental Status: He is alert and oriented to person, place, and time.  Psychiatric:        Mood and Affect: Mood normal.        Behavior: Behavior normal.        Thought Content: Thought  content normal.        Judgment: Judgment normal.      CBC:    BMET Recent Labs    04/24/22 0956 04/25/22 0412  NA 137 138  K 3.9 4.4  CL 99 99  CO2 29 30  GLUCOSE 170* 126*  BUN 6 5*  CREATININE 0.59* 0.75  CALCIUM 9.2 9.4      Liver Panel  Recent Labs    04/23/22 0257  PROT 7.1  ALBUMIN 3.1*  AST 13*  ALT 11  ALKPHOS 75  BILITOT 0.4        Sedimentation Rate Recent Labs    04/24/22 0956  ESRSEDRATE 50*    C-Reactive Protein Recent Labs    04/23/22 0257 04/24/22 0956  CRP 12.4* 13.4*     Micro Results: Recent Results (from the past 720 hour(s))  Blood culture (routine x 2)     Status: None (Preliminary result)   Collection Time: 04/22/22  2:52 PM   Specimen: BLOOD  Result Value Ref Range Status   Specimen Description BLOOD SITE NOT SPECIFIED  Final   Special Requests   Final    BOTTLES DRAWN AEROBIC AND ANAEROBIC Blood Culture adequate volume    Culture   Final    NO GROWTH 3 DAYS Performed at [facility], [address], [place], [place] [address]    Report Status PENDING  Incomplete  Blood culture (routine x 2)     Status: Abnormal   Collection Time: 04/22/22  7:24 PM   Specimen: BLOOD  Result Value Ref Range Status   Specimen Description BLOOD LEFT ANTECUBITAL  Final   Special Requests   Final    BOTTLES DRAWN AEROBIC AND ANAEROBIC Blood Culture results may not be optimal due to an excessive volume of blood received in culture bottles   Culture  Setup Time   Final    GRAM POSITIVE COCCI IN CLUSTERS IN BOTH AEROBIC AND ANAEROBIC BOTTLES CRITICAL RESULT CALLED TO, READ BACK BY AND VERIFIED WITH: PHARMD LISA CURRAN ON 04/23/22 @ 1951 BY DRT    Culture (A)  Final    STAPHYLOCOCCUS PSEUDINTERMEDIUS STAPHYLOCOCCUS EPIDERMIDIS THE SIGNIFICANCE OF ISOLATING THIS ORGANISM FROM A SINGLE SET OF BLOOD CULTURES WHEN MULTIPLE SETS ARE DRAWN IS UNCERTAIN. PLEASE NOTIFY THE MICROBIOLOGY DEPARTMENT WITHIN ONE WEEK IF SPECIATION AND SENSITIVITIES ARE REQUIRED. Performed at [facility], [address] [place], [place] [address]    Report Status 04/25/2022 FINAL  Final  Blood Culture ID Panel (Reflexed)     Status: Abnormal   Collection Time: 04/22/22  7:24 PM  Result Value Ref Range Status   Enterococcus faecalis NOT DETECTED NOT DETECTED Final   Enterococcus Faecium NOT DETECTED NOT DETECTED Final   Listeria monocytogenes NOT DETECTED NOT DETECTED Final   Staphylococcus species DETECTED (A) NOT DETECTED Final    Comment: CRITICAL RESULT CALLED TO, READ BACK BY AND VERIFIED WITH: PHARMD LISA CURRAN ON 04/23/22 @ 1951 BY DRT    Staphylococcus aureus (BCID) NOT DETECTED NOT DETECTED Final   Staphylococcus epidermidis DETECTED (A) NOT DETECTED Final    Comment: Methicillin (oxacillin) resistant coagulase negative staphylococcus. Possible blood culture contaminant (unless isolated from more than one blood culture draw or  clinical case suggests pathogenicity). No antibiotic treatment is indicated for blood  culture contaminants. CRITICAL RESULT CALLED TO, READ BACK BY AND VERIFIED WITH: PHARMD LISA CURRAN ON 04/23/22 @ 1951 BY DRT    Staphylococcus lugdunensis NOT DETECTED NOT DETECTED Final  Streptococcus species NOT DETECTED NOT DETECTED Final   Streptococcus agalactiae NOT DETECTED NOT DETECTED Final   Streptococcus pneumoniae NOT DETECTED NOT DETECTED Final   Streptococcus pyogenes NOT DETECTED NOT DETECTED Final   A.calcoaceticus-baumannii NOT DETECTED NOT DETECTED Final   Bacteroides fragilis NOT DETECTED NOT DETECTED Final   Enterobacterales NOT DETECTED NOT DETECTED Final   Enterobacter cloacae complex NOT DETECTED NOT DETECTED Final   Escherichia coli NOT DETECTED NOT DETECTED Final   Klebsiella aerogenes NOT DETECTED NOT DETECTED Final   Klebsiella oxytoca NOT DETECTED NOT DETECTED Final   Klebsiella pneumoniae NOT DETECTED NOT DETECTED Final   Proteus species NOT DETECTED NOT DETECTED Final   Salmonella species NOT DETECTED NOT DETECTED Final   Serratia marcescens NOT DETECTED NOT DETECTED Final   Haemophilus influenzae NOT DETECTED NOT DETECTED Final   Neisseria meningitidis NOT DETECTED NOT DETECTED Final   Pseudomonas aeruginosa NOT DETECTED NOT DETECTED Final   Stenotrophomonas maltophilia NOT DETECTED NOT DETECTED Final   Candida albicans NOT DETECTED NOT DETECTED Final   Candida auris NOT DETECTED NOT DETECTED Final   Candida glabrata NOT DETECTED NOT DETECTED Final   Candida krusei NOT DETECTED NOT DETECTED Final   Candida parapsilosis NOT DETECTED NOT DETECTED Final   Candida tropicalis NOT DETECTED NOT DETECTED Final   Cryptococcus neoformans/gattii NOT DETECTED NOT DETECTED Final   Methicillin resistance mecA/C DETECTED (A) NOT DETECTED Final    Comment: CRITICAL RESULT CALLED TO, READ BACK BY AND VERIFIED WITH: PHARMD LISA CURRAN ON 04/23/22 @ 1951 BY DRT Performed at [facility], [address] [place], [place] [address]   Body fluid culture w Gram Stain     Status: Abnormal (Preliminary result)   Collection Time: 04/24/22 11:37 AM   Specimen: PATH Disc; Body Fluid  Result Value Ref Range Status   Specimen Description FLUID  Final   Special Requests  DISC  Final   Gram Stain   Final    RARE WBC PRESENT, PREDOMINANTLY PMN RARE GRAM VARIABLE ROD    Culture (A)  Final    PSEUDOMONAS AERUGINOSA SUSCEPTIBILITIES TO FOLLOW Performed at [facility], [address], [place], [place] [address]    Report Status PENDING  Incomplete    Studies/Results: [place] Abscess Drain  Result Date: 04/25/2022 INDICATION: Briefly, 56 year old male with history of L-spine osteomyelitis presenting with back pain with MR T-spine revealing osteomyelitis discitis of the thoracic spine with superficial phlegmon/abscess. EXAM: ULTRASOUND GUIDED LEFT SUPERFICIAL THORACIC ABSCESS ASPIRATION COMPARISON:  IR fluoroscopy, 04/24/2022.  MR T-spine, 04/23/2022. MEDICATIONS: The patient is currently admitted to the hospital and receiving intravenous antibiotics. The antibiotics were administered within an appropriate time frame prior to the initiation of the procedure. ANESTHESIA/SEDATION: Local anesthetic was administered. CONTRAST:  None COMPLICATIONS: None immediate. PROCEDURE: Informed written consent was obtained from the patient after a discussion of the risks, benefits and alternatives to treatment. Preprocedural ultrasound scanning demonstrated a heterogeneously echogenic LEFT superficial back partial fluid collection. A timeout was performed prior to the initiation of the procedure. The LEFT back was prepped and draped in the usual sterile fashion. The overlying soft tissues were anesthetized with 1% lidocaine with epinephrine. Under direct ultrasound guidance, a 18 gauge trocar needle was advanced into the abscess/fluid collection. Multiple ultrasound images were saved for procedural  documentation purposes. Next, approximately 1 mL of serosanguineous fluid was aspirated from the collection. A representative sample of aspirated fluid was capped and sent to the laboratory for analysis. The needle was  removed and superficial hemostasis was achieved with manual compression. A dressing was placed. The patient tolerated the procedure well without immediate postprocedural complication. IMPRESSION: Successful US guided aspiration of 1 mL of serosanguineous fluid from LEFT superficial thoracic collection. A representative aspirated sample was sent to the laboratory as requested by the ordering clinical team. Roanna Banning, MD [facility] Radiology Electronically Signed   By: Roanna Banning M.D.   On: 04/25/2022 12:35   IR Fluoro Guide Ndl Plmt / BX  Result Date: 04/24/2022 INDICATION: John Ware is a 56 y.o. male with medical history significant for osteomyelitis of the lumbar spine, chronic diastolic heart failure, and type 2 diabetes mellitus. He presented to the ED on 04/22/2022 complaining of back pain and was found to have acute osteomyelitis/discitis of the thoracic spine. He comes to our service today for a T10-11 fluoroscopy guided disc aspiration. EXAM: FLUOROSCOPY GUIDED T10-11 INTERVERTEBRAL DISC ASPIRATION MEDICATIONS: The patient is currently admitted to the hospital and receiving intravenous antibiotics. The antibiotics were administered within an appropriate time frame prior to the initiation of the procedure. ANESTHESIA/SEDATION: Moderate (conscious) sedation was employed during this procedure. A total of Versed 3.5 mg and Fentanyl 175 mcg were administered intravenously for moderate conscious sedation monitored under my direct supervision. Total intraservice time of sedation was 27 minutes. The patient's vital signs were monitored throughout the procedure and recorded in the patient's medical record by the nurse. COMPLICATIONS: None  immediate. PROCEDURE: Informed written consent was obtained from the patient after a thorough discussion of the procedural risks, benefits and alternatives. All questions were addressed. Maximal Sterile Barrier Technique was utilized including caps, mask, sterile gowns, sterile gloves, sterile drape, hand hygiene and skin antiseptic. A timeout was performed prior to the initiation of the procedure. The patient was placed in prone position on the angiography table. The thoracic spine region was prepped and draped in a sterile fashion. Under fluoroscopy, the T10-11 was delineated and the skin area was marked. The skin was infiltrated with a 1% Lidocaine approximately 4 cm lateral to the spinous process projection on the left. Under fluoroscopy guidance, an 18-gauge trocar needle was into the T10-11 disc space using a left posterolateral approach. Then, a 22 gauge x 20 cm spinal needle was advanced through the trocar into the disc space and an FNA pass was performed. Given minimal aspirate, the disc space was injected with 1 cc of sterile saline and 1 cc of bupivacaine. An additional FNA pass was performed yielding approximately 1 cc of blood tinged aspirate. The needle was subsequently withdrawn. The access sites were cleaned and covered with a sterile bandage. Peak skin dose (PSD) 440 mGy. IMPRESSION: Successful fine-needle aspiration of the T10-11 intervertebral disc. Aspirate was sent to the laboratory for Gram stain and culture. Electronically Signed   By: Baldemar Lenis M.D.   On: 04/24/2022 14:24      Assessment/Plan:  INTERVAL HISTORY:   Aspirate of the disc space culture is now growing Pseudomonas he had an aspirate of the soft tissue which shows purulence  Principal Problem:   Osteomyelitis of vertebra of thoracolumbar region [facility]) Active Problems:   DM2 (diabetes mellitus, type 2) (HCC)   Discitis of thoracolumbar region   Chronic diastolic CHF (congestive heart failure) (HCC)    Chronic pain    Layn L Kazmierski is a 56 y.o. 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   During that admission he was found to have MRSA bacteremia.   He also had worsening discitis and osteomyelitis at L1-2 and L2-3 with stenosis of L1 and L2 with progressive weakness.   He was taken the operating room by [name] on June 05, 2021 and underwent lumbar laminotomies decompression L1-L2 and L2-L3 on the left with decompression dural tube and nerve roots L1-L2 and 3.    He was initially treated with daptomycin, and then changed to vancomycin and rifampin ultimately transition to chronic indefinite doxycycline therapy  Fortunately he is now readmitted with extensive infection in the thoracic spine with discitis and osteomyelitis that are acute as well as an epidural phlegmon abscess as well as soft tissue abscess.  #1 Acute osteomyelitis discitis with epidural abscess   Appreciate [name] seeing the patient and certainly neurosurgery is still potentially on the table but he is going to follow closely.  Unfortunately the IR guided aspirate culture is yielded Pseudomonas aeruginosa.  I suspect it is the same 1 that was isolated in his shoulder and that he had occult bacteremia with this organism as well during some point in his journey  We waited until he had had his soft tissue abscess in the back aspirated and then initiated cefepime and vancomycin  We will plan on  using vancomycin and cefepime plus or minus rifampin to complete 6 weeks of therapy followed by protracted oral therapy presuming that the Pseudomonas is susceptible to fluoroquinolones.  That is the case would likely have him on years of doxycycline plus ciprofloxacin plus or minus rifampin  I will order PICC line  #2 Soft tissue infection:   This area is purulent and he will need an I&D of this site to help with his source control.  #3 Staphylococcus epidermidis species 1 of 2 admission blood cultures.  This is undoubtably a contaminant.  I spent 52 minutes with the patient including than 50% of the time in face to face counseling of the patient and his wife regarding the nature of disc infections epidural abscess the particular organism Pseudomonas aeruginosa that is now been isolated from thoracic spine aspirate and the need for protracted therapy targeting of both MRSA MSSA and Pseudomonas, the need for surgery on the soft tissue abscess potentially on the spine  along with review of medical records in preparation for the visit and during the visit and in coordination of his care.  .  LOS: 3 days   Acey Lav 04/25/2022, 3:54 PM

## 2022-04-26 DIAGNOSIS — E1169 Type 2 diabetes mellitus with other specified complication: Secondary | ICD-10-CM | POA: Diagnosis not present

## 2022-04-26 DIAGNOSIS — Z794 Long term (current) use of insulin: Secondary | ICD-10-CM

## 2022-04-26 DIAGNOSIS — I5032 Chronic diastolic (congestive) heart failure: Secondary | ICD-10-CM | POA: Diagnosis not present

## 2022-04-26 DIAGNOSIS — M4625 Osteomyelitis of vertebra, thoracolumbar region: Secondary | ICD-10-CM | POA: Diagnosis not present

## 2022-04-26 DIAGNOSIS — G062 Extradural and subdural abscess, unspecified: Secondary | ICD-10-CM | POA: Diagnosis not present

## 2022-04-26 DIAGNOSIS — M4645 Discitis, unspecified, thoracolumbar region: Secondary | ICD-10-CM | POA: Diagnosis not present

## 2022-04-26 LAB — BODY FLUID CULTURE W GRAM STAIN

## 2022-04-26 LAB — BASIC METABOLIC PANEL
Anion gap: 10 (ref 5–15)
BUN: 7 mg/dL (ref 6–20)
CO2: 27 mmol/L (ref 22–32)
Calcium: 9 mg/dL (ref 8.9–10.3)
Chloride: 99 mmol/L (ref 98–111)
Creatinine, Ser: 0.61 mg/dL (ref 0.61–1.24)
GFR, Estimated: >60 mL/min (ref >60)
Glucose, Bld: 112 mg/dL — ABNORMAL HIGH (ref 70–99)
Potassium: 4.3 mmol/L (ref 3.5–5.1)
Sodium: 136 mmol/L (ref 135–145)

## 2022-04-26 LAB — CBC
HCT: 29 % — ABNORMAL LOW (ref 39.0–52.0)
Hemoglobin: 9.3 g/dL — ABNORMAL LOW (ref 13.0–17.0)
MCH: 25.5 pg — ABNORMAL LOW (ref 26.0–34.0)
MCHC: 32.1 g/dL (ref 30.0–36.0)
MCV: 79.7 fL — ABNORMAL LOW (ref 80.0–100.0)
Platelets: 503 10*3/uL — ABNORMAL HIGH (ref 150–400)
RBC: 3.64 MIL/uL — ABNORMAL LOW (ref 4.22–5.81)
RDW: 14.4 % (ref 11.5–15.5)
WBC: 10.3 10*3/uL (ref 4.0–10.5)
nRBC: 0 % (ref 0.0–0.2)

## 2022-04-26 NOTE — Progress Notes (Signed)
[facility]  PROGRESS NOTE  John Ware NUU:[id number] DOB: November 21, 1965 DOA: 04/22/2022 PCP: Junie Spencer, FNP   Brief HPI:   56 year old male with history of tricuspid valve endocarditis, discitis/osteomyelitis, diabetes mellitus type 2, HFpEF presented with progressive low back pain.  He was discharged on 06/11/2021 after prolonged hospitalization for MSSA bacteremia, TV endocarditis, lumbar osteomyelitis.  -Patient underwent T10-11 disc aspiration per IR yesterday -Culture growing Pseudomonas  Subjective   Patient seen and examined, no new complaints.   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  Subacute to chronic changes of osteomyelitis discitis at L1-2 and l2-3 mildly improved in appearance as compared to 08/17/2021.  spine MRI with discitis/osteomyelitis T9-10 through T11-12 (worst at T10-11) with epidural phlegmon and abscess along posterior endplates from T9-10 through T12 measuring up to 4 mm in thickenss resulting in severe spinal canal stenosis and cord compression, but no definite cord signal abnormality.  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  Extensive enhancement in paraspinal soft tissues at these levels c/w additional phlegmonous change without evidence of paraspinal abscess.  Enhancement throughout posterior soft tissues in midthoracic spine extending from T2-T7 without evidence of fluid collection at these levels.  Fluid collection in posterior soft tissues to the L of midline at T12 without convincing peripheral enhancement, may reflect hematoma or seroma (abscess not excluded).  Obliteration of disc space with grade 1 retrolisthesis at T3-4 likely reflecting sequela of prior  discitis/osteo at this level.  -ID and neurosurgery following -Patient underwent T10-11 disc aspiration per IR yesterday -Culture growing Pseudomonas -Patient is currently on vancomycin and cefepime  Diabetes mellitus type 2 -Hemoglobin A1c is 8.4 -Continue sliding scale insulin with NovoLog  Chronic diastolic CHF -EF of 60 to 65%, grade 1 diastolic dysfunction -Appears euvolemic  Chronic pain syndrome -Continue home meds -Continue baclofen, MS Contin, oxycodone -Continue IV morphine for breakthrough pain  Medications     baclofen  5 mg Oral TID   gabapentin  100 mg Oral BID   insulin aspart  0-6 Units Subcutaneous TID WC   morphine  45 mg Oral Q12H     Data Reviewed:   CBG:  Recent Labs  Lab 04/23/22 2003 04/24/22 0643 04/24/22 1225 04/24/22 1714 04/24/22 2156  GLUCAP 142* 138* 117* 121* 159*    SpO2: 100 % O2 Flow Rate (L/min): 2 L/min    Vitals:   04/26/22 0355 04/26/22 0824 04/26/22 1151 04/26/22 1646  BP: 99/75 122/81 (!) 115/99 115/73  Pulse: 65 63 65 64  Resp: 16 18 18 18   Temp: 98.9 F (37.2 C)  97.6 F (36.4 C) 98.3 F (36.8 C)  TempSrc:    Oral  SpO2: 97% 96% 98% 100%  Weight:      Height:          Data Reviewed:  Basic Metabolic Panel: Recent Labs  Lab 04/22/22 1451 04/23/22 0257 04/24/22 0956 04/25/22 0412 04/26/22 0559  NA 137 138 137 138 136  K 3.9 4.2 3.9 4.4 4.3  CL 98 100 99 99 99  CO2 29 26 29 30 27   GLUCOSE 173* 141* 170* 126* 112*  BUN 7 6 6  5* 7  CREATININE 0.65 0.61 0.59* 0.75 0.61  CALCIUM 9.7 9.2 9.2 9.4 9.0  MG  --  1.7  --   --   --  CBC: Recent Labs  Lab 04/22/22 1451 04/23/22 0257 04/24/22 0956 04/25/22 0412 04/26/22 0559  WBC 10.6* 11.0* 11.4* 10.6* 10.3  NEUTROABS 6.9 7.0  --   --   --   HGB 11.4* 10.6* 10.8* 10.0* 9.3*  HCT 36.1* 33.9* 33.8* 30.8* 29.0*  MCV 81.3 81.5 80.5 78.0* 79.7*  PLT 700* 633* 609* 572* 503*    LFT Recent Labs  Lab 04/22/22 1451 04/23/22 0257  AST 15 13*   ALT 14 11  ALKPHOS 88 75  BILITOT 0.6 0.4  PROT 8.4* 7.1  ALBUMIN 3.5 3.1*     Antibiotics: Anti-infectives (From admission, onward)    Start     Dose/Rate Route Frequency Ordered Stop   04/26/22 0200  vancomycin (VANCOREADY) IVPB 1250 mg/250 mL        1,250 mg 166.7 mL/hr over 90 Minutes Intravenous Every 12 hours 04/25/22 1250     04/25/22 1400  ceFEPIme (MAXIPIME) 2 g in sodium chloride 0.9 % 100 mL IVPB        2 g 200 mL/hr over 30 Minutes Intravenous Every 8 hours 04/25/22 1236     04/25/22 1345  vancomycin (VANCOREADY) IVPB 1500 mg/300 mL        1,500 mg 150 mL/hr over 120 Minutes Intravenous  Once 04/25/22 1250 04/25/22 1558   04/24/22 0400  vancomycin (VANCOREADY) IVPB 1250 mg/250 mL  Status:  Discontinued        1,250 mg 166.7 mL/hr over 90 Minutes Intravenous Every 12 hours 04/23/22 1416 04/24/22 0913   04/23/22 1400  vancomycin (VANCOREADY) IVPB 1500 mg/300 mL        1,500 mg 150 mL/hr over 120 Minutes Intravenous  Once 04/23/22 1349 04/23/22 1831   04/22/22 2200  ceFAZolin (ANCEF) IVPB 2g/100 mL premix  Status:  Discontinued        2 g 200 mL/hr over 30 Minutes Intravenous Every 8 hours 04/22/22 2157 04/23/22 1019        DVT prophylaxis: SCDs  Code Status: Full code  Family Communication: No family at bedside   CONSULTS infectious disease, neurosurgery   Objective    Physical Examination:  General-appears in no acute distress Heart-S1-S2, regular, no murmur auscultated Lungs-clear to auscultation bilaterally, no wheezing or crackles auscultated Abdomen-soft, nontender, no organomegaly Extremities-no edema in the lower extremities Neuro-alert, oriented x3, no focal deficit noted  Status is: Inpatient:             Meredeth Ide   [facility] If 7PM-7AM, please contact night-coverage at [email or web address], Office  [phone]   04/26/2022, 4:52 PM  LOS: 4 days

## 2022-04-26 NOTE — Progress Notes (Signed)
Subjective:  John Ware is very unhappy about not having his pain sufficiently controlled  Antibiotics:  Anti-infectives (From admission, onward)    Start     Dose/Rate Route Frequency Ordered Stop   04/26/22 0200  vancomycin (VANCOREADY) IVPB 1250 mg/250 mL        1,250 mg 166.7 mL/hr over 90 Minutes Intravenous Every 12 hours 04/25/22 1250     04/25/22 1400  ceFEPIme (MAXIPIME) 2 g in sodium chloride 0.9 % 100 mL IVPB        2 g 200 mL/hr over 30 Minutes Intravenous Every 8 hours 04/25/22 1236     04/25/22 1345  vancomycin (VANCOREADY) IVPB 1500 mg/300 mL        1,500 mg 150 mL/hr over 120 Minutes Intravenous  Once 04/25/22 1250 04/25/22 1558   04/24/22 0400  vancomycin (VANCOREADY) IVPB 1250 mg/250 mL  Status:  Discontinued        1,250 mg 166.7 mL/hr over 90 Minutes Intravenous Every 12 hours 04/23/22 1416 04/24/22 0913   04/23/22 1400  vancomycin (VANCOREADY) IVPB 1500 mg/300 mL        1,500 mg 150 mL/hr over 120 Minutes Intravenous  Once 04/23/22 1349 04/23/22 1831   04/22/22 2200  ceFAZolin (ANCEF) IVPB 2g/100 mL premix  Status:  Discontinued        2 g 200 mL/hr over 30 Minutes Intravenous Every 8 hours 04/22/22 2157 04/23/22 1019       Medications: Scheduled Meds:  baclofen  5 mg Oral TID   gabapentin  100 mg Oral BID   insulin aspart  0-6 Units Subcutaneous TID WC   morphine  45 mg Oral Q12H   Continuous Infusions:  sodium chloride Stopped (04/23/22 1631)   ceFEPime (MAXIPIME) IV 2 g (04/26/22 0449)   vancomycin 1,250 mg (04/26/22 0111)   PRN Meds:.sodium chloride, acetaminophen **OR** acetaminophen, morphine injection, naLOXone (NARCAN)  injection, ondansetron (ZOFRAN) IV, oxyCODONE-acetaminophen    Objective: Weight change:   Intake/Output Summary (Last 24 hours) at 04/26/2022 1409 Last data filed at 04/26/2022 0406 Gross per 24 hour  Intake --  Output 700 ml  Net -700 ml    Blood pressure (!) 115/99, pulse 65, temperature 97.6 F (36.4 C),  resp. rate 18, height 5\' 10"  (1.778 m), weight 76.7 kg, SpO2 98 %. Temp:  [97.6 F (36.4 C)-99.1 F (37.3 C)] 97.6 F (36.4 C) (09/01 1151) Pulse Rate:  [63-74] 65 (09/01 1151) Resp:  [15-18] 18 (09/01 1151) BP: (99-122)/(71-99) 115/99 (09/01 1151) SpO2:  [95 %-98 %] 98 % (09/01 1151)  Physical Exam: Physical Exam Constitutional:      Appearance: John Ware is well-developed.  HENT:     Head: Normocephalic and atraumatic.  Eyes:     Conjunctiva/sclera: Conjunctivae normal.  Cardiovascular:     Rate and Rhythm: Normal rate and regular rhythm.  Pulmonary:     Effort: Pulmonary effort is normal. No respiratory distress.     Breath sounds: No wheezing.  Abdominal:     General: There is no distension.     Palpations: Abdomen is soft.  Musculoskeletal:     Cervical back: Normal range of motion and neck supple. Swelling present.     Comments: Abscess is certainly tender and edematous  Skin:    General: Skin is warm and dry.     Findings: No erythema or rash.  Neurological:     Mental Status: John Ware is alert and oriented to person, place, and time.  Psychiatric:  Attention and Perception: Attention and perception normal.        Mood and Affect: Mood normal. Affect is angry.        Behavior: Behavior is agitated.        Thought Content: Thought content normal.        Cognition and Memory: Cognition and memory normal.        Judgment: Judgment normal.      CBC:    BMET Recent Labs    04/25/22 0412 04/26/22 0559  NA 138 136  K 4.4 4.3  CL 99 99  CO2 30 27  GLUCOSE 126* 112*  BUN 5* 7  CREATININE 0.75 0.61  CALCIUM 9.4 9.0      Liver Panel  No results for input(s): "PROT", "ALBUMIN", "AST", "ALT", "ALKPHOS", "BILITOT", "BILIDIR", "IBILI" in the last 72 hours.      Sedimentation Rate Recent Labs    04/24/22 0956  ESRSEDRATE 50*    C-Reactive Protein Recent Labs    04/24/22 0956  CRP 13.4*     Micro Results: Recent Results (from the past 720  hour(s))  Blood culture (routine x 2)     Status: None (Preliminary result)   Collection Time: 04/22/22  2:52 PM   Specimen: BLOOD  Result Value Ref Range Status   Specimen Description BLOOD SITE NOT SPECIFIED  Final   Special Requests   Final    BOTTLES DRAWN AEROBIC AND ANAEROBIC Blood Culture adequate volume   Culture   Final    NO GROWTH 4 DAYS Performed at [facility], [address], [place], [place] [address]    Report Status PENDING  Incomplete  Blood culture (routine x 2)     Status: Abnormal   Collection Time: 04/22/22  7:24 PM   Specimen: BLOOD  Result Value Ref Range Status   Specimen Description BLOOD LEFT ANTECUBITAL  Final   Special Requests   Final    BOTTLES DRAWN AEROBIC AND ANAEROBIC Blood Culture results may not be optimal due to an excessive volume of blood received in culture bottles   Culture  Setup Time   Final    GRAM POSITIVE COCCI IN CLUSTERS IN BOTH AEROBIC AND ANAEROBIC BOTTLES CRITICAL RESULT CALLED TO, READ BACK BY AND VERIFIED WITH: PHARMD LISA CURRAN ON 04/23/22 @ 1951 BY DRT    Culture (A)  Final    STAPHYLOCOCCUS PSEUDINTERMEDIUS STAPHYLOCOCCUS EPIDERMIDIS THE SIGNIFICANCE OF ISOLATING THIS ORGANISM FROM A SINGLE SET OF BLOOD CULTURES WHEN MULTIPLE SETS ARE DRAWN IS UNCERTAIN. PLEASE NOTIFY THE MICROBIOLOGY DEPARTMENT WITHIN ONE WEEK IF SPECIATION AND SENSITIVITIES ARE REQUIRED. Performed at [facility], [address], [place], [place] [address]    Report Status 04/25/2022 FINAL  Final  Blood Culture ID Panel (Reflexed)     Status: Abnormal   Collection Time: 04/22/22  7:24 PM  Result Value Ref Range Status   Enterococcus faecalis NOT DETECTED NOT DETECTED Final   Enterococcus Faecium NOT DETECTED NOT DETECTED Final   Listeria monocytogenes NOT DETECTED NOT DETECTED Final   Staphylococcus species DETECTED (A) NOT DETECTED Final    Comment: CRITICAL RESULT CALLED TO, READ BACK BY AND VERIFIED WITH: PHARMD LISA CURRAN ON 04/23/22 @  1951 BY DRT    Staphylococcus aureus (BCID) NOT DETECTED NOT DETECTED Final   Staphylococcus epidermidis DETECTED (A) NOT DETECTED Final    Comment: Methicillin (oxacillin) resistant coagulase negative staphylococcus. Possible blood culture contaminant (unless isolated from more than one blood culture draw or  clinical case suggests pathogenicity). No antibiotic treatment is indicated for blood  culture contaminants. CRITICAL RESULT CALLED TO, READ BACK BY AND VERIFIED WITH: PHARMD LISA CURRAN ON 04/23/22 @ 1951 BY DRT    Staphylococcus lugdunensis NOT DETECTED NOT DETECTED Final   Streptococcus species NOT DETECTED NOT DETECTED Final   Streptococcus agalactiae NOT DETECTED NOT DETECTED Final   Streptococcus pneumoniae NOT DETECTED NOT DETECTED Final   Streptococcus pyogenes NOT DETECTED NOT DETECTED Final   A.calcoaceticus-baumannii NOT DETECTED NOT DETECTED Final   Bacteroides fragilis NOT DETECTED NOT DETECTED Final   Enterobacterales NOT DETECTED NOT DETECTED Final   Enterobacter cloacae complex NOT DETECTED NOT DETECTED Final   Escherichia coli NOT DETECTED NOT DETECTED Final   Klebsiella aerogenes NOT DETECTED NOT DETECTED Final   Klebsiella oxytoca NOT DETECTED NOT DETECTED Final   Klebsiella pneumoniae NOT DETECTED NOT DETECTED Final   Proteus species NOT DETECTED NOT DETECTED Final   Salmonella species NOT DETECTED NOT DETECTED Final   Serratia marcescens NOT DETECTED NOT DETECTED Final   Haemophilus influenzae NOT DETECTED NOT DETECTED Final   Neisseria meningitidis NOT DETECTED NOT DETECTED Final   Pseudomonas aeruginosa NOT DETECTED NOT DETECTED Final   Stenotrophomonas maltophilia NOT DETECTED NOT DETECTED Final   Candida albicans NOT DETECTED NOT DETECTED Final   Candida auris NOT DETECTED NOT DETECTED Final   Candida glabrata NOT DETECTED NOT DETECTED Final   Candida krusei NOT DETECTED NOT DETECTED Final   Candida parapsilosis NOT DETECTED NOT DETECTED Final   Candida  tropicalis NOT DETECTED NOT DETECTED Final   Cryptococcus neoformans/gattii NOT DETECTED NOT DETECTED Final   Methicillin resistance mecA/C DETECTED (A) NOT DETECTED Final    Comment: CRITICAL RESULT CALLED TO, READ BACK BY AND VERIFIED WITH: PHARMD LISA CURRAN ON 04/23/22 @ 1951 BY DRT Performed at [facility], [address], [place], [place] [address]   Body fluid culture w Gram Stain     Status: Abnormal   Collection Time: 04/24/22 11:37 AM   Specimen: PATH Disc; Body Fluid  Result Value Ref Range Status   Specimen Description FLUID  Final   Special Requests  DISC  Final   Gram Stain   Final    RARE WBC PRESENT, PREDOMINANTLY PMN RARE GRAM VARIABLE ROD Performed at [facility], [address] [place], [place] [address]    Culture PSEUDOMONAS AERUGINOSA (A)  Final   Report Status 04/26/2022 FINAL  Final   Organism ID, Bacteria PSEUDOMONAS AERUGINOSA  Final      Susceptibility   Pseudomonas aeruginosa - MIC*    CEFTAZIDIME 4 SENSITIVE Sensitive     CIPROFLOXACIN <=0.25 SENSITIVE Sensitive     GENTAMICIN <=1 SENSITIVE Sensitive     IMIPENEM 1 SENSITIVE Sensitive     PIP/TAZO 8 SENSITIVE Sensitive     CEFEPIME 2 SENSITIVE Sensitive     * PSEUDOMONAS AERUGINOSA  Aerobic/Anaerobic Culture w Gram Stain (surgical/deep wound)     Status: None (Preliminary result)   Collection Time: 04/25/22 12:10 PM   Specimen: Abscess  Result Value Ref Range Status   Specimen Description ABSCESS  Final   Special Requests NONE  Final   Gram Stain   Final    FEW WBC PRESENT, PREDOMINANTLY PMN NO ORGANISMS SEEN    Culture   Final    FEW PSEUDOMONAS AERUGINOSA CULTURE REINCUBATED FOR BETTER GROWTH Performed at [facility], [address] [place], [place] [address]    Report Status PENDING  Incomplete  Studies/Results: [place] Abscess Drain  Result Date: 04/25/2022 INDICATION: Briefly, 56 year old male with history of L-spine osteomyelitis presenting with back pain with MR  T-spine revealing osteomyelitis discitis of the thoracic spine with superficial phlegmon/abscess. EXAM: ULTRASOUND GUIDED LEFT SUPERFICIAL THORACIC ABSCESS ASPIRATION COMPARISON:  IR fluoroscopy, 04/24/2022.  MR T-spine, 04/23/2022. MEDICATIONS: The patient is currently admitted to the hospital and receiving intravenous antibiotics. The antibiotics were administered within an appropriate time frame prior to the initiation of the procedure. ANESTHESIA/SEDATION: Local anesthetic was administered. CONTRAST:  None COMPLICATIONS: None immediate. PROCEDURE: Informed written consent was obtained from the patient after a discussion of the risks, benefits and alternatives to treatment. Preprocedural ultrasound scanning demonstrated a heterogeneously echogenic LEFT superficial back partial fluid collection. A timeout was performed prior to the initiation of the procedure. The LEFT back was prepped and draped in the usual sterile fashion. The overlying soft tissues were anesthetized with 1% lidocaine with epinephrine. Under direct ultrasound guidance, a 18 gauge trocar needle was advanced into the abscess/fluid collection. Multiple ultrasound images were saved for procedural documentation purposes. Next, approximately 1 mL of serosanguineous fluid was aspirated from the collection. A representative sample of aspirated fluid was capped and sent to the laboratory for analysis. The needle was removed and superficial hemostasis was achieved with manual compression. A dressing was placed. The patient tolerated the procedure well without immediate postprocedural complication. IMPRESSION: Successful US guided aspiration of 1 mL of serosanguineous fluid from LEFT superficial thoracic collection. A representative aspirated sample was sent to the laboratory as requested by the ordering clinical team. Roanna Banning, MD [facility] Radiology Electronically Signed   By: Roanna Banning M.D.   On:  04/25/2022 12:35      Assessment/Plan:  INTERVAL HISTORY:   Pseudomonas growing from the disc space as well as the abscess in  soft tissue  Principal Problem:   Osteomyelitis of vertebra of thoracolumbar region [facility]) Active Problems:   DM2 (diabetes mellitus, type 2) (HCC)   Discitis of thoracolumbar region   Chronic diastolic CHF (congestive heart failure) (HCC)   Chronic pain    John Ware is a 56 y.o. 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   During that admission John Ware was found to have MRSA bacteremia.   John Ware also had worsening discitis and osteomyelitis at L1-2 and L2-3 with stenosis of L1 and L2 with progressive weakness.   John Ware was taken the operating room by [name] on June 05, 2021 and underwent lumbar laminotomies decompression L1-L2 and L2-L3 on the left with decompression dural tube and nerve roots L1-L2 and 3.    John Ware was initially treated with daptomycin, and then changed to vancomycin and rifampin ultimately transition to chronic indefinite doxycycline therapy  UNfortunately John Ware is now readmitted with extensive infection in the thoracic spine with discitis and osteomyelitis that are acute as well as an epidural phlegmon abscess as well as soft tissue abscess.  #1 Acute osteomyelitis discitis with epidural abscess   Appreciate [name] seeing the patient and certainly neurosurgery is still potentially on the table but  John Ware is going to follow  closely.  Unfortunately the IR guided aspirate culture is yielded Pseudomonas aeruginosa.  I suspect it is the same 1 that was isolated in his shoulder and that John Ware had occult bacteremia with this organism as well during some point in his journey  We will continue cefepime and vancomycin present and plan on completing at least 6 weeks of therapy postoperatively if surgery is performed followed by lifelong doxycycline and ciprofloxacin  Dr. Danielle DessElsner and his colleagues will be monitoring closely for need for neurosurgical intervention on spine   #2 Soft tissue infection:   This area is going to need an I&D to help us get source control  #3 Staphylococcus epidermidis  and pseudointermedius species 1 of 2 admission blood cultures.  =  contaminant.  #4 Pain control: I can certainly empathize with the patient having severe pain with an active infection so certainly a common sending feature of deep infection.  Is also on the wait and monitor response to treatment.  John Ware did not appear to be as physically uncomfortable as John Ware was stating and has shown some belligerence it appears based on some of the notes.   I spent 51 minutes with the patient including than 50% of the time in face to face counseling of the patient guarding his soft tissue infection and his discitis vertebral osteomyelitis epidural abscess, along with review of medical records in preparation for the visit and during the visit and in coordination of his care.  My partner Dr. Renold DonVu is available for questions this weekend and I will be back on Tuesday   .  LOS: 4 days   John Ware 04/26/2022, 2:09 PM

## 2022-04-27 DIAGNOSIS — M4625 Osteomyelitis of vertebra, thoracolumbar region: Secondary | ICD-10-CM | POA: Diagnosis not present

## 2022-04-27 DIAGNOSIS — M4645 Discitis, unspecified, thoracolumbar region: Secondary | ICD-10-CM | POA: Diagnosis not present

## 2022-04-27 DIAGNOSIS — E1169 Type 2 diabetes mellitus with other specified complication: Secondary | ICD-10-CM | POA: Diagnosis not present

## 2022-04-27 DIAGNOSIS — I5032 Chronic diastolic (congestive) heart failure: Secondary | ICD-10-CM | POA: Diagnosis not present

## 2022-04-27 LAB — BASIC METABOLIC PANEL
Anion gap: 7 (ref 5–15)
BUN: 7 mg/dL (ref 6–20)
CO2: 26 mmol/L (ref 22–32)
Calcium: 8.8 mg/dL — ABNORMAL LOW (ref 8.9–10.3)
Chloride: 101 mmol/L (ref 98–111)
Creatinine, Ser: 0.63 mg/dL (ref 0.61–1.24)
GFR, Estimated: >60 mL/min (ref >60)
Glucose, Bld: 137 mg/dL — ABNORMAL HIGH (ref 70–99)
Potassium: 4.2 mmol/L (ref 3.5–5.1)
Sodium: 134 mmol/L — ABNORMAL LOW (ref 135–145)

## 2022-04-27 LAB — CBC
HCT: 28.6 % — ABNORMAL LOW (ref 39.0–52.0)
Hemoglobin: 9.5 g/dL — ABNORMAL LOW (ref 13.0–17.0)
MCH: 26 pg (ref 26.0–34.0)
MCHC: 33.2 g/dL (ref 30.0–36.0)
MCV: 78.1 fL — ABNORMAL LOW (ref 80.0–100.0)
Platelets: 501 10*3/uL — ABNORMAL HIGH (ref 150–400)
RBC: 3.66 MIL/uL — ABNORMAL LOW (ref 4.22–5.81)
RDW: 14.3 % (ref 11.5–15.5)
WBC: 10.7 10*3/uL — ABNORMAL HIGH (ref 4.0–10.5)
nRBC: 0 % (ref 0.0–0.2)

## 2022-04-27 LAB — CULTURE, BLOOD (ROUTINE X 2)
Culture: NO GROWTH
Special Requests: ADEQUATE

## 2022-04-27 LAB — VANCOMYCIN, PEAK: Vancomycin Pk: 32 ug/mL (ref 30–40)

## 2022-04-27 NOTE — Progress Notes (Signed)
[facility]  PROGRESS NOTE  John Ware TDD:[id number] DOB: [birth year] DOA: 04/22/2022 PCP: Junie Spencer, FNP   Brief HPI:   56 year old male with history of tricuspid valve endocarditis, discitis/osteomyelitis, diabetes mellitus type 2, HFpEF presented with progressive low back pain.  He was discharged on 06/11/2021 after prolonged hospitalization for MSSA bacteremia, TV endocarditis, lumbar osteomyelitis.  -Patient underwent T10-11 disc aspiration per IR yesterday -Culture growing Pseudomonas  Subjective   Patient seen and examined, pain controlled.   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  Subacute to chronic changes of osteomyelitis discitis at L1-2 and l2-3 mildly improved in appearance as compared to 08/17/2021.  spine MRI with discitis/osteomyelitis T9-10 through T11-12 (worst at T10-11) with epidural phlegmon and abscess along posterior endplates from T9-10 through T12 measuring up to 4 mm in thickenss resulting in severe spinal canal stenosis and cord compression, but no definite cord signal abnormality.  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  Extensive enhancement in paraspinal soft tissues at these levels c/w additional phlegmonous change without evidence of paraspinal abscess.  Enhancement throughout posterior soft tissues in midthoracic spine extending from T2-T7 without evidence of fluid collection at these levels.  Fluid collection in posterior soft tissues to the L of midline at T12 without convincing peripheral enhancement, may reflect hematoma or seroma (abscess not excluded).  Obliteration of disc space with grade 1 retrolisthesis at T3-4 likely reflecting sequela of prior discitis/osteo  at this level.  -ID and neurosurgery following -Patient underwent T10-11 disc aspiration per IR yesterday -Culture growing Pseudomonas -Patient is currently on vancomycin and cefepime  Diabetes mellitus type 2 -Hemoglobin A1c is 8.4 -Continue sliding scale insulin with NovoLog -CBG well controlled  Chronic diastolic CHF -EF of 60 to 65%, grade 1 diastolic dysfunction -Appears euvolemic  Chronic pain syndrome -Continue home meds -Continue baclofen, MS Contin, oxycodone -Continue IV morphine for breakthrough pain  Medications     baclofen  5 mg Oral TID   gabapentin  100 mg Oral BID   insulin aspart  0-6 Units Subcutaneous TID WC   morphine  45 mg Oral Q12H     Data Reviewed:   CBG:  Recent Labs  Lab 04/23/22 2003 04/24/22 0643 04/24/22 1225 04/24/22 1714 04/24/22 2156  GLUCAP 142* 138* 117* 121* 159*    SpO2: 98 % O2 Flow Rate (L/min): 2 L/min    Vitals:   04/27/22 0022 04/27/22 0410 04/27/22 0742 04/27/22 1100  BP: 132/81 103/68 98/71 114/85  Pulse: 69 63 65 68  Resp: 20 18 16 14   Temp: 99.2 F (37.3 C) 98.5 F (36.9 C) 98.8 F (37.1 C) 98.9 F (37.2 C)  TempSrc: Oral Oral Oral Oral  SpO2: 97% 98% 97% 98%  Weight:      Height:          Data Reviewed:  Basic Metabolic Panel: Recent Labs  Lab 04/23/22 0257 04/24/22 0956 04/25/22 0412 04/26/22 0559 04/27/22 0441  NA 138 137 138 136 134*  K 4.2 3.9 4.4 4.3 4.2  CL 100 99 99 99 101  CO2 26 29 30 27 26   GLUCOSE 141* 170* 126* 112* 137*  BUN 6 6 5* 7 7  CREATININE 0.61 0.59* 0.75 0.61 0.63  CALCIUM 9.2 9.2 9.4 9.0 8.8*  MG 1.7  --   --   --   --  CBC: Recent Labs  Lab 04/22/22 1451 04/23/22 0257 04/24/22 0956 04/25/22 0412 04/26/22 0559 04/27/22 0441  WBC 10.6* 11.0* 11.4* 10.6* 10.3 10.7*  NEUTROABS 6.9 7.0  --   --   --   --   HGB 11.4* 10.6* 10.8* 10.0* 9.3* 9.5*  HCT 36.1* 33.9* 33.8* 30.8* 29.0* 28.6*  MCV 81.3 81.5 80.5 78.0* 79.7* 78.1*  PLT 700* 633* 609* 572*  503* 501*    LFT Recent Labs  Lab 04/22/22 1451 04/23/22 0257  AST 15 13*  ALT 14 11  ALKPHOS 88 75  BILITOT 0.6 0.4  PROT 8.4* 7.1  ALBUMIN 3.5 3.1*     Antibiotics: Anti-infectives (From admission, onward)    Start     Dose/Rate Route Frequency Ordered Stop   04/26/22 0200  vancomycin (VANCOREADY) IVPB 1250 mg/250 mL        1,250 mg 166.7 mL/hr over 90 Minutes Intravenous Every 12 hours 04/25/22 1250     04/25/22 1400  ceFEPIme (MAXIPIME) 2 g in sodium chloride 0.9 % 100 mL IVPB        2 g 200 mL/hr over 30 Minutes Intravenous Every 8 hours 04/25/22 1236     04/25/22 1345  vancomycin (VANCOREADY) IVPB 1500 mg/300 mL        1,500 mg 150 mL/hr over 120 Minutes Intravenous  Once 04/25/22 1250 04/25/22 1558   04/24/22 0400  vancomycin (VANCOREADY) IVPB 1250 mg/250 mL  Status:  Discontinued        1,250 mg 166.7 mL/hr over 90 Minutes Intravenous Every 12 hours 04/23/22 1416 04/24/22 0913   04/23/22 1400  vancomycin (VANCOREADY) IVPB 1500 mg/300 mL        1,500 mg 150 mL/hr over 120 Minutes Intravenous  Once 04/23/22 1349 04/23/22 1831   04/22/22 2200  ceFAZolin (ANCEF) IVPB 2g/100 mL premix  Status:  Discontinued        2 g 200 mL/hr over 30 Minutes Intravenous Every 8 hours 04/22/22 2157 04/23/22 1019        DVT prophylaxis: SCDs  Code Status: Full code  Family Communication: No family at bedside   CONSULTS infectious disease, neurosurgery   Objective    Physical Examination:  General-appears in no acute distress Heart-S1-S2, regular, no murmur auscultated Lungs-clear to auscultation bilaterally, no wheezing or crackles auscultated Abdomen-soft, nontender, no organomegaly Extremities-no edema in the lower extremities Neuro-alert, oriented x3, no focal deficit noted  Status is: Inpatient:             Meredeth Ide   [facility] If 7PM-7AM, please contact night-coverage at [email or web address], Office  [phone]   04/27/2022, 1:35 PM  LOS:  5 days

## 2022-04-27 NOTE — Progress Notes (Signed)
Overall doing pretty well.  His back pain is moderate.  He is not having any radiating pain numbness or weakness.  His neurologic exam is stable.  Patient with lower thoracic osteomyelitis discitis.  Cultures consistent with Pseudomonas.  Continue antibiotic management.  Although patient has moderate stenosis from his epidural abscess he seems to be tolerating this well and there is no plan for surgical intervention.

## 2022-04-28 DIAGNOSIS — I5032 Chronic diastolic (congestive) heart failure: Secondary | ICD-10-CM | POA: Diagnosis not present

## 2022-04-28 DIAGNOSIS — G8929 Other chronic pain: Secondary | ICD-10-CM | POA: Diagnosis not present

## 2022-04-28 DIAGNOSIS — M4625 Osteomyelitis of vertebra, thoracolumbar region: Secondary | ICD-10-CM | POA: Diagnosis not present

## 2022-04-28 DIAGNOSIS — M869 Osteomyelitis, unspecified: Secondary | ICD-10-CM | POA: Diagnosis not present

## 2022-04-28 LAB — BASIC METABOLIC PANEL
Anion gap: 11 (ref 5–15)
BUN: 8 mg/dL (ref 6–20)
CO2: 23 mmol/L (ref 22–32)
Calcium: 9.1 mg/dL (ref 8.9–10.3)
Chloride: 101 mmol/L (ref 98–111)
Creatinine, Ser: 0.65 mg/dL (ref 0.61–1.24)
GFR, Estimated: >60 mL/min (ref >60)
Glucose, Bld: 120 mg/dL — ABNORMAL HIGH (ref 70–99)
Potassium: 4.4 mmol/L (ref 3.5–5.1)
Sodium: 135 mmol/L (ref 135–145)

## 2022-04-28 LAB — CBC
HCT: 30.4 % — ABNORMAL LOW (ref 39.0–52.0)
Hemoglobin: 9.7 g/dL — ABNORMAL LOW (ref 13.0–17.0)
MCH: 25.2 pg — ABNORMAL LOW (ref 26.0–34.0)
MCHC: 31.9 g/dL (ref 30.0–36.0)
MCV: 79 fL — ABNORMAL LOW (ref 80.0–100.0)
Platelets: 526 10*3/uL — ABNORMAL HIGH (ref 150–400)
RBC: 3.85 MIL/uL — ABNORMAL LOW (ref 4.22–5.81)
RDW: 14.4 % (ref 11.5–15.5)
WBC: 11.5 10*3/uL — ABNORMAL HIGH (ref 4.0–10.5)
nRBC: 0 % (ref 0.0–0.2)

## 2022-04-28 LAB — VANCOMYCIN, TROUGH: Vancomycin Tr: 14 ug/mL — ABNORMAL LOW (ref 15–20)

## 2022-04-28 MED ORDER — VANCOMYCIN HCL 750 MG/150ML IV SOLN
750.0000 mg | Freq: Three times a day (TID) | INTRAVENOUS | Status: DC
  Administered 2022-04-28 – 2022-05-01 (×10): 750 mg via INTRAVENOUS
  Filled 2022-04-28 (×11): qty 150

## 2022-04-28 MED ORDER — SENNOSIDES-DOCUSATE SODIUM 8.6-50 MG PO TABS
1.0000 | ORAL_TABLET | Freq: Two times a day (BID) | ORAL | Status: DC
  Administered 2022-04-29: 1 via ORAL
  Filled 2022-04-28 (×11): qty 1

## 2022-04-28 MED ORDER — POLYETHYLENE GLYCOL 3350 17 G PO PACK
17.0000 g | PACK | Freq: Every day | ORAL | Status: DC | PRN
  Filled 2022-04-28: qty 1

## 2022-04-28 MED ORDER — HYDROXYZINE HCL 25 MG PO TABS
25.0000 mg | ORAL_TABLET | Freq: Three times a day (TID) | ORAL | Status: DC | PRN
Start: 2022-04-28 — End: 2022-04-29
  Administered 2022-04-28 (×2): 25 mg via ORAL
  Filled 2022-04-28 (×2): qty 1

## 2022-04-28 NOTE — Progress Notes (Addendum)
NEUROSURGERY PROGRESS NOTE  S/p discitis osteo T10-T12. Doing well, has some moderate back pain. Patient states [name] may plan for surgery on Tuesday. Continue IV abx and therapy. Strength in his legs is still 5/5  Temp:  [98.1 F (36.7 C)-99.4 F (37.4 C)] 98.6 F (37 C) (09/03 0743) Pulse Rate:  [58-69] 65 (09/03 0743) Resp:  [14-16] 16 (09/03 0743) BP: (98-122)/(68-85) 122/79 (09/03 0743) SpO2:  [95 %-100 %] 100 % (09/03 0743)   Sherryl Manges, NP 04/28/2022 8:57 AM

## 2022-04-28 NOTE — Progress Notes (Signed)
[facility]  PROGRESS NOTE  John Ware YHC:[id number] DOB: 03-10-1966 DOA: 04/22/2022 PCP: Junie Spencer, FNP   Brief HPI:   56 year old male with history of tricuspid valve endocarditis, discitis/osteomyelitis, diabetes mellitus type 2, HFpEF presented with progressive low back pain.  He was discharged on 06/11/2021 after prolonged hospitalization for MSSA bacteremia, TV endocarditis, lumbar osteomyelitis.  -Patient underwent T10-11 disc aspiration per IR yesterday -Culture growing Pseudomonas  Subjective   Complains of rash in both thighs, which was present before admission.  Patient said that he has been itching and scratching on both thighs.   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  Subacute to chronic changes of osteomyelitis discitis at L1-2 and l2-3 mildly improved in appearance as compared to 08/17/2021.  spine MRI with discitis/osteomyelitis T9-10 through T11-12 (worst at T10-11) with epidural phlegmon and abscess along posterior endplates from T9-10 through T12 measuring up to 4 mm in thickenss resulting in severe spinal canal stenosis and cord compression, but no definite cord signal abnormality.  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  Extensive enhancement in paraspinal soft tissues at these levels c/w additional phlegmonous change without evidence of paraspinal abscess.  Enhancement throughout posterior soft tissues in midthoracic spine extending from T2-T7 without evidence of fluid collection at these levels.  Fluid collection in posterior soft tissues to the L of midline at T12 without convincing peripheral enhancement, may reflect hematoma or seroma (abscess not excluded).  Obliteration of  disc space with grade 1 retrolisthesis at T3-4 likely reflecting sequela of prior discitis/osteo at this level.  -ID and neurosurgery following -Patient underwent T10-11 disc aspiration per IR yesterday -Culture growing Pseudomonas -Patient is currently on vancomycin and cefepime  Diabetes mellitus type 2 -Hemoglobin A1c is 8.4 -Continue sliding scale insulin with NovoLog -CBG well controlled  Chronic diastolic CHF -EF of 60 to 65%, grade 1 diastolic dysfunction -Appears euvolemic  Chronic pain syndrome -Continue home meds -Continue baclofen, MS Contin, oxycodone -Continue IV morphine for breakthrough pain  Skin rash -Patient complains of itching -We will start hydroxyzine 25 mg p.o. 3 times daily as needed for itching  Medications     baclofen  5 mg Oral TID   gabapentin  100 mg Oral BID   insulin aspart  0-6 Units Subcutaneous TID WC   morphine  45 mg Oral Q12H   senna-docusate  1 tablet Oral BID     Data Reviewed:   CBG:  Recent Labs  Lab 04/23/22 2003 04/24/22 0643 04/24/22 1225 04/24/22 1714 04/24/22 2156  GLUCAP 142* 138* 117* 121* 159*    SpO2: 99 % O2 Flow Rate (L/min): 2 L/min    Vitals:   04/28/22 0352 04/28/22 0352 04/28/22 0743 04/28/22 1204  BP: 98/68 98/68 122/79 131/88  Pulse: (!) 58 (!) 58 65 61  Resp:  16 16 16   Temp: 98.1 F (36.7 C) 98.1 F (36.7 C) 98.6 F (37 C) 98.4 F (36.9 C)  TempSrc: Oral Oral Oral Oral  SpO2: 96% 96% 100% 99%  Weight:      Height:          Data Reviewed:  Basic Metabolic Panel: Recent Labs  Lab 04/23/22 0257 04/24/22 0956 04/25/22 0412 04/26/22 0559 04/27/22 0441 04/28/22 0131  NA 138 137 138 136 134* 135  K  4.2 3.9 4.4 4.3 4.2 4.4  CL 100 99 99 99 101 101  CO2 26 29 30 27 26 23   GLUCOSE 141* 170* 126* 112* 137* 120*  BUN 6 6 5* 7 7 8   CREATININE 0.61 0.59* 0.75 0.61 0.63 0.65  CALCIUM 9.2 9.2 9.4 9.0 8.8* 9.1  MG 1.7  --   --   --   --   --     CBC: Recent Labs  Lab  04/22/22 1451 04/23/22 0257 04/24/22 0956 04/25/22 0412 04/26/22 0559 04/27/22 0441 04/28/22 0131  WBC 10.6* 11.0* 11.4* 10.6* 10.3 10.7* 11.5*  NEUTROABS 6.9 7.0  --   --   --   --   --   HGB 11.4* 10.6* 10.8* 10.0* 9.3* 9.5* 9.7*  HCT 36.1* 33.9* 33.8* 30.8* 29.0* 28.6* 30.4*  MCV 81.3 81.5 80.5 78.0* 79.7* 78.1* 79.0*  PLT 700* 633* 609* 572* 503* 501* 526*    LFT Recent Labs  Lab 04/22/22 1451 04/23/22 0257  AST 15 13*  ALT 14 11  ALKPHOS 88 75  BILITOT 0.6 0.4  PROT 8.4* 7.1  ALBUMIN 3.5 3.1*     Antibiotics: Anti-infectives (From admission, onward)    Start     Dose/Rate Route Frequency Ordered Stop   04/28/22 1200  vancomycin (VANCOREADY) IVPB 750 mg/150 mL        750 mg 150 mL/hr over 60 Minutes Intravenous Every 8 hours 04/28/22 0238     04/26/22 0200  vancomycin (VANCOREADY) IVPB 1250 mg/250 mL  Status:  Discontinued        1,250 mg 166.7 mL/hr over 90 Minutes Intravenous Every 12 hours 04/25/22 1250 04/28/22 0236   04/25/22 1400  ceFEPIme (MAXIPIME) 2 g in sodium chloride 0.9 % 100 mL IVPB        2 g 200 mL/hr over 30 Minutes Intravenous Every 8 hours 04/25/22 1236     04/25/22 1345  vancomycin (VANCOREADY) IVPB 1500 mg/300 mL        1,500 mg 150 mL/hr over 120 Minutes Intravenous  Once 04/25/22 1250 04/25/22 1558   04/24/22 0400  vancomycin (VANCOREADY) IVPB 1250 mg/250 mL  Status:  Discontinued        1,250 mg 166.7 mL/hr over 90 Minutes Intravenous Every 12 hours 04/23/22 1416 04/24/22 0913   04/23/22 1400  vancomycin (VANCOREADY) IVPB 1500 mg/300 mL        1,500 mg 150 mL/hr over 120 Minutes Intravenous  Once 04/23/22 1349 04/23/22 1831   04/22/22 2200  ceFAZolin (ANCEF) IVPB 2g/100 mL premix  Status:  Discontinued        2 g 200 mL/hr over 30 Minutes Intravenous Every 8 hours 04/22/22 2157 04/23/22 1019        DVT prophylaxis: SCDs  Code Status: Full code  Family Communication: No family at bedside   CONSULTS infectious disease,  neurosurgery   Objective    Physical Examination:  General-appears in no acute distress Heart-S1-S2, regular, no murmur auscultated Lungs-clear to auscultation bilaterally, no wheezing or crackles auscultated Abdomen-soft, nontender, no organomegaly Extremities-no edema in the lower extremities Neuro-alert, oriented x3, no focal deficit noted Skin-faint erythematous rash noted on both thighs, with areas of excoriation from scratching  Status is: Inpatient:        2158   [facility] If 7PM-7AM, please contact night-coverage at [email or web address], Office  [phone]   04/28/2022, 3:14 PM  LOS: 6 days

## 2022-04-28 NOTE — Progress Notes (Signed)
Pharmacy Antibiotic Note  John Ware is a 56 y.o. male admitted on 04/22/2022 with lower thoracic osteomyelitis discitis.  Pharmacy has been consulted for vancomycin dosing.  Vanc peak 32, trough 14 >> AUC 565, just over goal.  Plan: Change vancomycin to 750mg  IV Q8H for calculated AUC 510.  Height: 5\' 10"  (177.8 cm) Weight: 76.7 kg (169 lb 1.5 oz) IBW/kg (Calculated) : 73  Temp (24hrs), Avg:98.9 F (37.2 C), Min:98.5 F (36.9 C), Max:99.4 F (37.4 C)  Recent Labs  Lab 04/22/22 1451 04/23/22 0257 04/24/22 0956 04/25/22 0412 04/26/22 0559 04/27/22 0441 04/27/22 1707 04/28/22 0131  WBC 10.6*   < > 11.4* 10.6* 10.3 10.7*  --  11.5*  CREATININE 0.65   < > 0.59* 0.75 0.61 0.63  --  0.65  LATICACIDVEN 1.4  --   --   --   --   --   --   --   VANCOTROUGH  --   --   --   --   --   --   --  14*  VANCOPEAK  --   --   --   --   --   --  32  --    < > = values in this interval not displayed.    Estimated Creatinine Clearance: 109 mL/min (by C-G formula based on SCr of 0.65 mg/dL).    Allergies  Allergen Reactions   Tizanidine Other (See Comments)    Leg cramps    Antimicrobials this admission: Cefazolin 8/28 >> 8/29 (x2 doses) Vancomycin 8/29 >> 8/30; 8/31 >> Cefepime 8/31 >>  Dose adjustments this admission: Vanc 1250mg  Q12H >> AUC 565 >> vanc 750mg  Q8H  Microbiology results: 8/28 BCx: staph pseudintermedius, staph epidermidis (likely contaminant)  8/30 Disc cx: pseudomonas aeruginosa  8/31 abscess cx: NGTD   Thank you for allowing pharmacy to be a part of this patient's care.  , PharmD, BCPS  04/28/2022 2:39 AM

## 2022-04-29 DIAGNOSIS — M4625 Osteomyelitis of vertebra, thoracolumbar region: Secondary | ICD-10-CM | POA: Diagnosis not present

## 2022-04-29 DIAGNOSIS — E1169 Type 2 diabetes mellitus with other specified complication: Secondary | ICD-10-CM | POA: Diagnosis not present

## 2022-04-29 DIAGNOSIS — M4645 Discitis, unspecified, thoracolumbar region: Secondary | ICD-10-CM | POA: Diagnosis not present

## 2022-04-29 DIAGNOSIS — I5032 Chronic diastolic (congestive) heart failure: Secondary | ICD-10-CM | POA: Diagnosis not present

## 2022-04-29 MED ORDER — HYDROXYZINE HCL 10 MG PO TABS
10.0000 mg | ORAL_TABLET | Freq: Three times a day (TID) | ORAL | Status: DC | PRN
  Administered 2022-04-29 – 2022-05-08 (×20): 10 mg via ORAL
  Filled 2022-04-29 (×25): qty 1

## 2022-04-29 NOTE — Progress Notes (Signed)
Pt refusing CBGs at this time, states he knows when his BG is low and will let us know if he feels like it needs to be checked.

## 2022-04-29 NOTE — Progress Notes (Signed)
[facility]  PROGRESS NOTE  John Ware ERX:[id number] DOB: [birth year] DOA: 04/22/2022 PCP: Junie Spencer, FNP   Brief HPI:   56 year old male with history of tricuspid valve endocarditis, discitis/osteomyelitis, diabetes mellitus type 2, HFpEF presented with progressive low back pain.  He was discharged on 06/11/2021 after prolonged hospitalization for MSSA bacteremia, TV endocarditis, lumbar osteomyelitis.  -Patient underwent T10-11 disc aspiration per IR yesterday -Culture growing Pseudomonas  Subjective   Patient says that itching has improved with hydroxyzine though he feels very drowsy.   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  Subacute to chronic changes of osteomyelitis discitis at L1-2 and l2-3 mildly improved in appearance as compared to 08/17/2021.  spine MRI with discitis/osteomyelitis T9-10 through T11-12 (worst at T10-11) with epidural phlegmon and abscess along posterior endplates from T9-10 through T12 measuring up to 4 mm in thickenss resulting in severe spinal canal stenosis and cord compression, but no definite cord signal abnormality.  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  Extensive enhancement in paraspinal soft tissues at these levels c/w additional phlegmonous change without evidence of paraspinal abscess.  Enhancement throughout posterior soft tissues in midthoracic spine extending from T2-T7 without evidence of fluid collection at these levels.  Fluid collection in posterior soft tissues to the L of midline at T12 without convincing peripheral enhancement, may reflect hematoma or seroma (abscess not excluded).  Obliteration of disc space with grade 1 retrolisthesis at T3-4 likely  reflecting sequela of prior discitis/osteo at this level.  -ID and neurosurgery following -Patient underwent T10-11 disc aspiration per IR yesterday -Culture growing Pseudomonas -Patient is currently on vancomycin and cefepime  Diabetes mellitus type 2 -Hemoglobin A1c is 8.4 -Continue sliding scale insulin with NovoLog -CBG well controlled  Chronic diastolic CHF -EF of 60 to 65%, grade 1 diastolic dysfunction -Appears euvolemic  Chronic pain syndrome -Continue home meds -Continue baclofen, MS Contin, oxycodone -Continue IV morphine for breakthrough pain  Skin rash -Has faint maculopapular rash on both thighs with areas of excoriation from scratching -Improved with hydroxyzine -We will change the dose of hydroxyzine to 10 mg 3 times daily as needed due to somnolence  Medications     baclofen  5 mg Oral TID   gabapentin  100 mg Oral BID   insulin aspart  0-6 Units Subcutaneous TID WC   morphine  45 mg Oral Q12H   senna-docusate  1 tablet Oral BID     Data Reviewed:   CBG:  Recent Labs  Lab 04/23/22 2003 04/24/22 0643 04/24/22 1225 04/24/22 1714 04/24/22 2156  GLUCAP 142* 138* 117* 121* 159*    SpO2: 100 % O2 Flow Rate (L/min): 2 L/min    Vitals:   04/28/22 2357 04/29/22 0358 04/29/22 0807 04/29/22 1248  BP: 124/85 125/79 100/71 121/78  Pulse: 64 (!) 52 (!) 53 67  Resp: 18 18 20 17   Temp: 98.4 F (36.9 C) 98 F (36.7 C) 97.8 F (36.6 C) 98 F (36.7 C)  TempSrc: Oral Oral Oral Oral  SpO2: 100% 99% 98% 100%  Weight:      Height:          Data Reviewed:  Basic Metabolic Panel: Recent Labs  Lab 04/23/22 0257 04/24/22 0956 04/25/22 0412 04/26/22 0559 04/27/22 0441 04/28/22 0131  NA 138 137 138  136 134* 135  K 4.2 3.9 4.4 4.3 4.2 4.4  CL 100 99 99 99 101 101  CO2 26 29 30 27 26 23   GLUCOSE 141* 170* 126* 112* 137* 120*  BUN 6 6 5* 7 7 8   CREATININE 0.61 0.59* 0.75 0.61 0.63 0.65  CALCIUM 9.2 9.2 9.4 9.0 8.8* 9.1  MG 1.7  --   --   --    --   --     CBC: Recent Labs  Lab 04/22/22 1451 04/23/22 0257 04/24/22 0956 04/25/22 0412 04/26/22 0559 04/27/22 0441 04/28/22 0131  WBC 10.6* 11.0* 11.4* 10.6* 10.3 10.7* 11.5*  NEUTROABS 6.9 7.0  --   --   --   --   --   HGB 11.4* 10.6* 10.8* 10.0* 9.3* 9.5* 9.7*  HCT 36.1* 33.9* 33.8* 30.8* 29.0* 28.6* 30.4*  MCV 81.3 81.5 80.5 78.0* 79.7* 78.1* 79.0*  PLT 700* 633* 609* 572* 503* 501* 526*    LFT Recent Labs  Lab 04/22/22 1451 04/23/22 0257  AST 15 13*  ALT 14 11  ALKPHOS 88 75  BILITOT 0.6 0.4  PROT 8.4* 7.1  ALBUMIN 3.5 3.1*     Antibiotics: Anti-infectives (From admission, onward)    Start     Dose/Rate Route Frequency Ordered Stop   04/28/22 1200  vancomycin (VANCOREADY) IVPB 750 mg/150 mL        750 mg 150 mL/hr over 60 Minutes Intravenous Every 8 hours 04/28/22 0238     04/26/22 0200  vancomycin (VANCOREADY) IVPB 1250 mg/250 mL  Status:  Discontinued        1,250 mg 166.7 mL/hr over 90 Minutes Intravenous Every 12 hours 04/25/22 1250 04/28/22 0236   04/25/22 1400  ceFEPIme (MAXIPIME) 2 g in sodium chloride 0.9 % 100 mL IVPB        2 g 200 mL/hr over 30 Minutes Intravenous Every 8 hours 04/25/22 1236     04/25/22 1345  vancomycin (VANCOREADY) IVPB 1500 mg/300 mL        1,500 mg 150 mL/hr over 120 Minutes Intravenous  Once 04/25/22 1250 04/25/22 1558   04/24/22 0400  vancomycin (VANCOREADY) IVPB 1250 mg/250 mL  Status:  Discontinued        1,250 mg 166.7 mL/hr over 90 Minutes Intravenous Every 12 hours 04/23/22 1416 04/24/22 0913   04/23/22 1400  vancomycin (VANCOREADY) IVPB 1500 mg/300 mL        1,500 mg 150 mL/hr over 120 Minutes Intravenous  Once 04/23/22 1349 04/23/22 1831   04/22/22 2200  ceFAZolin (ANCEF) IVPB 2g/100 mL premix  Status:  Discontinued        2 g 200 mL/hr over 30 Minutes Intravenous Every 8 hours 04/22/22 2157 04/23/22 1019        DVT prophylaxis: SCDs  Code Status: Full code  Family Communication: No family at  bedside   CONSULTS infectious disease, neurosurgery   Objective    Physical Examination:  General-appears in no acute distress Heart-S1-S2, regular, no murmur auscultated Lungs-clear to auscultation bilaterally, no wheezing or crackles auscultated Abdomen-soft, nontender, no organomegaly Extremities-no edema in the lower extremities Neuro-alert, oriented x3, no focal deficit noted  Status is: Inpatient:        2158   [facility] If 7PM-7AM, please contact night-coverage at [email or web address], Office  [phone]   04/29/2022, 1:51 PM  LOS: 7 days

## 2022-04-29 NOTE — Progress Notes (Signed)
NEUROSURGERY PROGRESS NOTE  Doing well. Complains of appropriate back  soreness. No leg pain No numbness, tingling or weakness Patient states [name] is planning for surgery tomorrow even though his note says otherwise. We will make him npo after midnight just incase.   Temp:  [97.8 F (36.6 C)-98.7 F (37.1 C)] 97.8 F (36.6 C) (09/04 0807) Pulse Rate:  [52-75] 53 (09/04 0807) Resp:  [16-20] 20 (09/04 0807) BP: (100-131)/(46-88) 100/71 (09/04 0807) SpO2:  [98 %-100 %] 98 % (09/04 0807)   Sherryl Manges, NP 04/29/2022 8:51 AM

## 2022-04-30 DIAGNOSIS — M4625 Osteomyelitis of vertebra, thoracolumbar region: Secondary | ICD-10-CM | POA: Diagnosis not present

## 2022-04-30 DIAGNOSIS — G8929 Other chronic pain: Secondary | ICD-10-CM | POA: Diagnosis not present

## 2022-04-30 DIAGNOSIS — M869 Osteomyelitis, unspecified: Secondary | ICD-10-CM | POA: Diagnosis not present

## 2022-04-30 DIAGNOSIS — I5032 Chronic diastolic (congestive) heart failure: Secondary | ICD-10-CM | POA: Diagnosis not present

## 2022-04-30 LAB — GLUCOSE, CAPILLARY: Glucose-Capillary: 94 mg/dL (ref 70–99)

## 2022-04-30 LAB — BASIC METABOLIC PANEL
Anion gap: 9 (ref 5–15)
BUN: 10 mg/dL (ref 6–20)
CO2: 24 mmol/L (ref 22–32)
Calcium: 9.2 mg/dL (ref 8.9–10.3)
Chloride: 103 mmol/L (ref 98–111)
Creatinine, Ser: 0.64 mg/dL (ref 0.61–1.24)
GFR, Estimated: >60 mL/min (ref >60)
Glucose, Bld: 117 mg/dL — ABNORMAL HIGH (ref 70–99)
Potassium: 4.2 mmol/L (ref 3.5–5.1)
Sodium: 136 mmol/L (ref 135–145)

## 2022-04-30 LAB — AEROBIC/ANAEROBIC CULTURE W GRAM STAIN (SURGICAL/DEEP WOUND)

## 2022-04-30 NOTE — Progress Notes (Signed)
[facility]  PROGRESS NOTE  John Ware IDP:[id number] DOB: 1965/11/25 DOA: 04/22/2022 PCP: Junie Spencer, FNP   Brief HPI:   56 year old male with history of tricuspid valve endocarditis, discitis/osteomyelitis, diabetes mellitus type 2, HFpEF presented with progressive low back pain.  He was discharged on 06/11/2021 after prolonged hospitalization for MSSA bacteremia, TV endocarditis, lumbar osteomyelitis.  -Patient underwent T10-11 disc aspiration per IR yesterday -Culture growing Pseudomonas -Currently on cefepime and vancomycin  Subjective   Patient seen and examined, pain is well controlled.   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  Subacute to chronic changes of osteomyelitis discitis at L1-2 and l2-3 mildly improved in appearance as compared to 08/17/2021.  spine MRI with discitis/osteomyelitis T9-10 through T11-12 (worst at T10-11) with epidural phlegmon and abscess along posterior endplates from T9-10 through T12 measuring up to 4 mm in thickenss resulting in severe spinal canal stenosis and cord compression, but no definite cord signal abnormality.  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  Extensive enhancement in paraspinal soft tissues at these levels c/w additional phlegmonous change without evidence of paraspinal abscess.  Enhancement throughout posterior soft tissues in midthoracic spine extending from T2-T7 without evidence of fluid collection at these levels.  Fluid collection in posterior soft tissues to the L of midline at T12 without convincing peripheral enhancement, may reflect hematoma or seroma (abscess not excluded).  Obliteration of disc space with grade 1 retrolisthesis at T3-4  likely reflecting sequela of prior discitis/osteo at this level.  -ID and neurosurgery following -Patient underwent T10-11 disc aspiration per IR yesterday -Culture growing Pseudomonas -Patient is currently on vancomycin and cefepime  Diabetes mellitus type 2 -Hemoglobin A1c is 8.4 -Continue sliding scale insulin with NovoLog -CBG well controlled  Chronic diastolic CHF -EF of 60 to 65%, grade 1 diastolic dysfunction -Appears euvolemic  Chronic pain syndrome -Continue home meds -Continue baclofen, MS Contin, oxycodone -Continue IV morphine for breakthrough pain  Skin rash -Has faint maculopapular rash on both thighs with areas of excoriation from scratching -Improved with hydroxyzine -Continue hydroxyzine  10 mg 3 times daily as needed due to somnolence  Medications     baclofen  5 mg Oral TID   gabapentin  100 mg Oral BID   insulin aspart  0-6 Units Subcutaneous TID WC   morphine  45 mg Oral Q12H   senna-docusate  1 tablet Oral BID     Data Reviewed:   CBG:  Recent Labs  Lab 04/23/22 2003 04/24/22 0643 04/24/22 1225 04/24/22 1714 04/24/22 2156  GLUCAP 142* 138* 117* 121* 159*    SpO2: 96 % O2 Flow Rate (L/min): 2 L/min    Vitals:   04/29/22 2226 04/30/22 0316 04/30/22 0759 04/30/22 1132  BP: 114/78 102/81 105/71 (!) 132/92  Pulse: 68 65 (!) 57 66  Resp: 16 16 17 18   Temp: 98.7 F (37.1 C) 98 F (36.7 C) 98.2 F (36.8 C) 98.5 F (36.9 C)  TempSrc: Oral Oral Oral Oral  SpO2: 99% 98% 97% 96%  Weight:      Height:          Data Reviewed:  Basic Metabolic Panel: Recent Labs  Lab 04/25/22 0412 04/26/22 0559 04/27/22 0441 04/28/22 0131 04/30/22 0420  NA 138 136 134* 135 136  K 4.4 4.3 4.2  4.4 4.2  CL 99 99 101 101 103  CO2 30 27 26 23 24   GLUCOSE 126* 112* 137* 120* 117*  BUN 5* 7 7 8 10   CREATININE 0.75 0.61 0.63 0.65 0.64  CALCIUM 9.4 9.0 8.8* 9.1 9.2    CBC: Recent Labs  Lab 04/24/22 0956 04/25/22 0412 04/26/22 0559  04/27/22 0441 04/28/22 0131  WBC 11.4* 10.6* 10.3 10.7* 11.5*  HGB 10.8* 10.0* 9.3* 9.5* 9.7*  HCT 33.8* 30.8* 29.0* 28.6* 30.4*  MCV 80.5 78.0* 79.7* 78.1* 79.0*  PLT 609* 572* 503* 501* 526*    LFT No results for input(s): "AST", "ALT", "ALKPHOS", "BILITOT", "PROT", "ALBUMIN" in the last 168 hours.    Antibiotics: Anti-infectives (From admission, onward)    Start     Dose/Rate Route Frequency Ordered Stop   04/28/22 1200  vancomycin (VANCOREADY) IVPB 750 mg/150 mL        750 mg 150 mL/hr over 60 Minutes Intravenous Every 8 hours 04/28/22 0238     04/26/22 0200  vancomycin (VANCOREADY) IVPB 1250 mg/250 mL  Status:  Discontinued        1,250 mg 166.7 mL/hr over 90 Minutes Intravenous Every 12 hours 04/25/22 1250 04/28/22 0236   04/25/22 1400  ceFEPIme (MAXIPIME) 2 g in sodium chloride 0.9 % 100 mL IVPB        2 g 200 mL/hr over 30 Minutes Intravenous Every 8 hours 04/25/22 1236     04/25/22 1345  vancomycin (VANCOREADY) IVPB 1500 mg/300 mL        1,500 mg 150 mL/hr over 120 Minutes Intravenous  Once 04/25/22 1250 04/25/22 1558   04/24/22 0400  vancomycin (VANCOREADY) IVPB 1250 mg/250 mL  Status:  Discontinued        1,250 mg 166.7 mL/hr over 90 Minutes Intravenous Every 12 hours 04/23/22 1416 04/24/22 0913   04/23/22 1400  vancomycin (VANCOREADY) IVPB 1500 mg/300 mL        1,500 mg 150 mL/hr over 120 Minutes Intravenous  Once 04/23/22 1349 04/23/22 1831   04/22/22 2200  ceFAZolin (ANCEF) IVPB 2g/100 mL premix  Status:  Discontinued        2 g 200 mL/hr over 30 Minutes Intravenous Every 8 hours 04/22/22 2157 04/23/22 1019        DVT prophylaxis: SCDs  Code Status: Full code  Family Communication: No family at bedside   CONSULTS infectious disease, neurosurgery   Objective    Physical Examination:  General-appears in no acute distress Heart-S1-S2, regular, no murmur auscultated Lungs-clear to auscultation bilaterally, no wheezing or crackles  auscultated Abdomen-soft, nontender, no organomegaly Extremities-no edema in the lower extremities Neuro-alert, oriented x3, no focal deficit noted  Status is: Inpatient:        2158   [facility] If 7PM-7AM, please contact night-coverage at [email or web address], Office  [phone]   04/30/2022, 12:33 PM  LOS: 8 days

## 2022-04-30 NOTE — Progress Notes (Signed)
Patient continues to refuse POC blood sugar checks and subsequent insulin coverage

## 2022-05-01 ENCOUNTER — Ambulatory Visit: Payer: Commercial Managed Care - PPO | Admitting: Infectious Diseases

## 2022-05-01 ENCOUNTER — Inpatient Hospital Stay (HOSPITAL_COMMUNITY): Payer: Commercial Managed Care - PPO

## 2022-05-01 DIAGNOSIS — M4625 Osteomyelitis of vertebra, thoracolumbar region: Secondary | ICD-10-CM | POA: Diagnosis not present

## 2022-05-01 DIAGNOSIS — I5032 Chronic diastolic (congestive) heart failure: Secondary | ICD-10-CM | POA: Diagnosis not present

## 2022-05-01 DIAGNOSIS — M869 Osteomyelitis, unspecified: Secondary | ICD-10-CM | POA: Diagnosis not present

## 2022-05-01 DIAGNOSIS — M4645 Discitis, unspecified, thoracolumbar region: Secondary | ICD-10-CM | POA: Diagnosis not present

## 2022-05-01 LAB — GLUCOSE, CAPILLARY: Glucose-Capillary: 143 mg/dL — ABNORMAL HIGH (ref 70–99)

## 2022-05-01 MED ORDER — GADOBUTROL 1 MMOL/ML IV SOLN
7.5000 mL | Freq: Once | INTRAVENOUS | Status: AC | PRN
  Administered 2022-05-01: 7.5 mL via INTRAVENOUS

## 2022-05-01 MED ORDER — DOXYCYCLINE HYCLATE 100 MG PO TABS
100.0000 mg | ORAL_TABLET | Freq: Two times a day (BID) | ORAL | Status: DC
  Administered 2022-05-01 – 2022-05-08 (×14): 100 mg via ORAL
  Filled 2022-05-01 (×14): qty 1

## 2022-05-01 NOTE — Progress Notes (Signed)
[facility] PROGRESS NOTE    Progress Note  John Ware  ZYS:[id number] DOB: 1966-01-27 DOA: 04/22/2022 PCP: Junie [place], FNP     Brief Narrative:   John Ware is an 56 y.o. male past medical history of tricuspid valve endocarditis, discitis and osteomyelitis, diabetes mellitus type 2, chronic diastolic heart failure comes in for progressive back pain was discharged home on 06/11/2021 after prolonged hospitalization for MSSA bacteremia and tricuspid endocarditis.  Underwent T10-T11 disc aspiration by IR, who grew Pseudomonas he is currently on bank and cefepime.   Assessment/Plan:   Osteomyelitis of vertebra of thoracolumbar region [facility]) MRI of the lumbar spine showed phlegmon and abscess at T12-L1 without frank compression of the cord chronic osteomyelitis discitis at L1-L2 mild improved in appearance compared to 2022.  There was extensive enhancement and paraspinal soft tissue septic arthritis and T11 and T12 ID and neurosurgery was consulted Patient underwent T10-T11 disc aspiration, culture grew Pseudomonas ID recommended IV vancomycin and cefepime for minimum of 6 weeks postoperatively if surgery is performed followed by lifelong doxycycline and ciprofloxacin. Neurosurgery recommended an MRI of the spine to see if there is any improvement. Neurosurgery also recommended an MRI awaiting results.  Diabetes mellitus type 2: With an A1c of 8.4, relatively well controlled continue sliding scale insulin.  Chronic diastolic heart failure: Appears euvolemic continue current medication.  Chronic pain syndrome: Continue baclofen MS Contin and oxycodone. Continue IV morphine for breakthrough pain. Skin rash: Improved with hydroxyzine.  Stage I sacral decubitus ulcer of the left buttock RN Pressure Injury Documentation: Pressure Injury 06/11/21 Buttocks Left Stage 1 -  Intact skin with non-blanchable redness of a localized area usually over a bony prominence.  (Active)  06/11/21 1617  Location: Buttocks  Location Orientation: Left  Staging: Stage 1 -  Intact skin with non-blanchable redness of a localized area usually over a bony prominence.  Wound Description (Comments):   Present on Admission: Yes     Pressure Injury 06/11/21 Buttocks Right Stage 1 -  Intact skin with non-blanchable redness of a localized area usually over a bony prominence. (Active)  06/11/21 1618  Location: Buttocks  Location Orientation: Right  Staging: Stage 1 -  Intact skin with non-blanchable redness of a localized area usually over a bony prominence.  Wound Description (Comments):   Present on Admission: Yes    Estimated body mass index is 24.26 kg/m as calculated from the following:   Height as of this encounter: 5\' 10"  (1.778 m).   Weight as of this encounter: 76.7 kg.  DVT prophylaxis: lovenox Family Communication:Wife Status is: Inpatient Remains inpatient appropriate because: Osteomyelitis and abscess of the back    Code Status:     Code Status Orders  (From admission, onward)           Start     Ordered   04/22/22 2233  Full code  Continuous        04/22/22 2233           Code Status History     Date Active Date Inactive Code Status Order ID Comments User Context   06/11/2021 1552 06/26/2021 1813 Full Code 13/08/2020  [id number] Inpatient   05/28/2021 1925 06/11/2021 1538 Full Code 06/13/2021  [id number], MD ED   01/31/2021 1453 03/15/2021 2102 Full Code 2103  [id number], DO Inpatient   10/14/2017 1803 10/15/2017 1738 Full Code 10/17/2017  [id number], MD Inpatient   12/13/2014 1654 12/14/2014 1323 Full  Code [id number]  Kristeen Miss, MD Inpatient         IV Access:   Peripheral IV   Procedures and diagnostic studies:   No results found.   Medical Consultants:   None.   Subjective:    Brownie L Valone relates his pain is controlled.  Objective:    Vitals:   04/30/22 1556 04/30/22 2013  05/01/22 0746 05/01/22 1154  BP: 118/89 (!) 140/87 107/76 (!) 107/90  Pulse: 98 (!) 106 64 (!) 57  Resp: 20  18 20   Temp: 98.5 F (36.9 C) 98.7 F (37.1 C) 98.7 F (37.1 C) 98.1 F (36.7 C)  TempSrc: Oral Oral Oral Oral  SpO2: 100% 100% 98% 100%  Weight:      Height:       SpO2: 100 % O2 Flow Rate (L/min): 2 L/min   Intake/Output Summary (Last 24 hours) at 05/01/2022 1255 Last data filed at 05/01/2022 0622 Gross per 24 hour  Intake 1260 ml  Output 1300 ml  Net -40 ml   Filed Weights   04/23/22 1441 04/24/22 0423  Weight: 76 kg 76.7 kg    Exam: General exam: In no acute distress. Respiratory system: Good air movement and clear to auscultation. Cardiovascular system: S1 & S2 heard, RRR. No JVD. Gastrointestinal system: Abdomen is nondistended, soft and nontender.  Extremities: No pedal edema. Skin: No rashes, lesions or ulcers Psychiatry: Judgement and insight appear normal. Mood & affect appropriate.    Data Reviewed:    Labs: Basic Metabolic Panel: Recent Labs  Lab 04/25/22 0412 04/26/22 0559 04/27/22 0441 04/28/22 0131 04/30/22 0420  NA 138 136 134* 135 136  K 4.4 4.3 4.2 4.4 4.2  CL 99 99 101 101 103  CO2 30 27 26 23 24   GLUCOSE 126* 112* 137* 120* 117*  BUN 5* 7 7 8 10   CREATININE 0.75 0.61 0.63 0.65 0.64  CALCIUM 9.4 9.0 8.8* 9.1 9.2   GFR Estimated Creatinine Clearance: 109 mL/min (by C-G formula based on SCr of 0.64 mg/dL). Liver Function Tests: No results for input(s): "AST", "ALT", "ALKPHOS", "BILITOT", "PROT", "ALBUMIN" in the last 168 hours. No results for input(s): "LIPASE", "AMYLASE" in the last 168 hours. No results for input(s): "AMMONIA" in the last 168 hours. Coagulation profile No results for input(s): "INR", "PROTIME" in the last 168 hours. COVID-19 Labs  No results for input(s): "DDIMER", "FERRITIN", "LDH", "CRP" in the last 72 hours.  Lab Results  Component Value Date   SARSCOV2NAA NEGATIVE 05/28/2021   SARSCOV2NAA POSITIVE  (A) 01/31/2021   [place] NEGATIVE 08/23/2020    CBC: Recent Labs  Lab 04/25/22 0412 04/26/22 0559 04/27/22 0441 04/28/22 0131  WBC 10.6* 10.3 10.7* 11.5*  HGB 10.0* 9.3* 9.5* 9.7*  HCT 30.8* 29.0* 28.6* 30.4*  MCV 78.0* 79.7* 78.1* 79.0*  PLT 572* 503* 501* 526*   Cardiac Enzymes: No results for input(s): "CKTOTAL", "CKMB", "CKMBINDEX", "TROPONINI" in the last 168 hours. BNP (last 3 results) No results for input(s): "PROBNP" in the last 8760 hours. CBG: Recent Labs  Lab 04/24/22 1714 04/24/22 2156 04/30/22 1601  GLUCAP 121* 159* 94   D-Dimer: No results for input(s): "DDIMER" in the last 72 hours. Hgb A1c: No results for input(s): "HGBA1C" in the last 72 hours. Lipid Profile: No results for input(s): "CHOL", "HDL", "LDLCALC", "TRIG", "CHOLHDL", "LDLDIRECT" in the last 72 hours. Thyroid function studies: No results for input(s): "TSH", "T4TOTAL", "T3FREE", "THYROIDAB" in the last 72 hours.  Invalid input(s): "FREET3" Anemia work up: No  results for input(s): "VITAMINB12", "FOLATE", "FERRITIN", "TIBC", "IRON", "RETICCTPCT" in the last 72 hours. Sepsis Labs: Recent Labs  Lab 04/25/22 0412 04/26/22 0559 04/27/22 0441 04/28/22 0131  WBC 10.6* 10.3 10.7* 11.5*   Microbiology Recent Results (from the past 240 hour(s))  Blood culture (routine x 2)     Status: None   Collection Time: 04/22/22  2:52 PM   Specimen: BLOOD  Result Value Ref Range Status   Specimen Description BLOOD SITE NOT SPECIFIED  Final   Special Requests   Final    BOTTLES DRAWN AEROBIC AND ANAEROBIC Blood Culture adequate volume   Culture   Final    NO GROWTH 5 DAYS Performed at [facility], [address] [place], [place] [address]    Report Status 04/27/2022 FINAL  Final  Blood culture (routine x 2)     Status: Abnormal   Collection Time: 04/22/22  7:24 PM   Specimen: BLOOD  Result Value Ref Range Status   Specimen Description BLOOD LEFT ANTECUBITAL  Final   Special Requests    Final    BOTTLES DRAWN AEROBIC AND ANAEROBIC Blood Culture results may not be optimal due to an excessive volume of blood received in culture bottles   Culture  Setup Time   Final    GRAM POSITIVE COCCI IN CLUSTERS IN BOTH AEROBIC AND ANAEROBIC BOTTLES CRITICAL RESULT CALLED TO, READ BACK BY AND VERIFIED WITH: PHARMD LISA [place] ON 04/23/22 @ 1951 BY DRT    Culture (A)  Final    STAPHYLOCOCCUS PSEUDINTERMEDIUS STAPHYLOCOCCUS EPIDERMIDIS THE SIGNIFICANCE OF ISOLATING THIS ORGANISM FROM A SINGLE SET OF BLOOD CULTURES WHEN MULTIPLE SETS ARE DRAWN IS UNCERTAIN. PLEASE NOTIFY THE MICROBIOLOGY DEPARTMENT WITHIN ONE WEEK IF SPECIATION AND SENSITIVITIES ARE REQUIRED. Performed at [facility], [place] [address], [place], [place] [address]    Report Status 04/25/2022 FINAL  Final  Blood Culture ID Panel (Reflexed)     Status: Abnormal   Collection Time: 04/22/22  7:24 PM  Result Value Ref Range Status   Enterococcus faecalis NOT DETECTED NOT DETECTED Final   Enterococcus Faecium NOT DETECTED NOT DETECTED Final   Listeria monocytogenes NOT DETECTED NOT DETECTED Final   Staphylococcus species DETECTED (A) NOT DETECTED Final    Comment: CRITICAL RESULT CALLED TO, READ BACK BY AND VERIFIED WITH: PHARMD LISA CURRAN ON 04/23/22 @ 1951 BY DRT    Staphylococcus aureus (BCID) NOT DETECTED NOT DETECTED Final   Staphylococcus epidermidis DETECTED (A) NOT DETECTED Final    Comment: Methicillin (oxacillin) resistant coagulase negative staphylococcus. Possible blood culture contaminant (unless isolated from more than one blood culture draw or clinical case suggests pathogenicity). No antibiotic treatment is indicated for blood  culture contaminants. CRITICAL RESULT CALLED TO, READ BACK BY AND VERIFIED WITH: PHARMD LISA CURRAN ON 04/23/22 @ 1951 BY DRT    Staphylococcus lugdunensis NOT DETECTED NOT DETECTED Final   Streptococcus species NOT DETECTED NOT DETECTED Final   Streptococcus agalactiae NOT  DETECTED NOT DETECTED Final   Streptococcus pneumoniae NOT DETECTED NOT DETECTED Final   Streptococcus pyogenes NOT DETECTED NOT DETECTED Final   A.calcoaceticus-baumannii NOT DETECTED NOT DETECTED Final   Bacteroides fragilis NOT DETECTED NOT DETECTED Final   Enterobacterales NOT DETECTED NOT DETECTED Final   Enterobacter cloacae complex NOT DETECTED NOT DETECTED Final   Escherichia coli NOT DETECTED NOT DETECTED Final   Klebsiella aerogenes NOT DETECTED NOT DETECTED Final   Klebsiella oxytoca NOT DETECTED NOT DETECTED Final   Klebsiella pneumoniae NOT DETECTED  NOT DETECTED Final   Proteus species NOT DETECTED NOT DETECTED Final   Salmonella species NOT DETECTED NOT DETECTED Final   Serratia marcescens NOT DETECTED NOT DETECTED Final   Haemophilus influenzae NOT DETECTED NOT DETECTED Final   Neisseria meningitidis NOT DETECTED NOT DETECTED Final   Pseudomonas aeruginosa NOT DETECTED NOT DETECTED Final   Stenotrophomonas maltophilia NOT DETECTED NOT DETECTED Final   Candida albicans NOT DETECTED NOT DETECTED Final   Candida auris NOT DETECTED NOT DETECTED Final   Candida glabrata NOT DETECTED NOT DETECTED Final   Candida krusei NOT DETECTED NOT DETECTED Final   Candida parapsilosis NOT DETECTED NOT DETECTED Final   Candida tropicalis NOT DETECTED NOT DETECTED Final   Cryptococcus neoformans/gattii NOT DETECTED NOT DETECTED Final   Methicillin resistance mecA/C DETECTED (A) NOT DETECTED Final    Comment: CRITICAL RESULT CALLED TO, READ BACK BY AND VERIFIED WITH: PHARMD LISA CURRAN ON 04/23/22 @ 1951 BY DRT Performed at [facility], [address] [place], [place] [address]   Body fluid culture w Gram Stain     Status: Abnormal   Collection Time: 04/24/22 11:37 AM   Specimen: PATH Disc; Body Fluid  Result Value Ref Range Status   Specimen Description FLUID  Final   Special Requests  DISC  Final   Gram Stain   Final    RARE WBC PRESENT, PREDOMINANTLY PMN RARE GRAM VARIABLE  ROD Performed at [facility] [address], [place], [place] [address]    Culture PSEUDOMONAS AERUGINOSA (A)  Final   Report Status 04/26/2022 FINAL  Final   Organism ID, Bacteria PSEUDOMONAS AERUGINOSA  Final      Susceptibility   Pseudomonas aeruginosa - MIC*    CEFTAZIDIME 4 SENSITIVE Sensitive     CIPROFLOXACIN <=0.25 SENSITIVE Sensitive     GENTAMICIN <=1 SENSITIVE Sensitive     IMIPENEM 1 SENSITIVE Sensitive     PIP/TAZO 8 SENSITIVE Sensitive     CEFEPIME 2 SENSITIVE Sensitive     * PSEUDOMONAS AERUGINOSA  Aerobic/Anaerobic Culture w Gram Stain (surgical/deep wound)     Status: None   Collection Time: 04/25/22 12:10 PM   Specimen: Abscess  Result Value Ref Range Status   Specimen Description ABSCESS  Final   Special Requests NONE  Final   Gram Stain   Final    FEW WBC PRESENT, PREDOMINANTLY PMN NO ORGANISMS SEEN    Culture   Final    FEW PSEUDOMONAS AERUGINOSA NO ANAEROBES ISOLATED Performed at [facility], [place] [address], [place], [place] [address]    Report Status 04/30/2022 FINAL  Final   Organism ID, Bacteria PSEUDOMONAS AERUGINOSA  Final      Susceptibility   Pseudomonas aeruginosa - MIC*    CEFTAZIDIME 4 SENSITIVE Sensitive     CIPROFLOXACIN <=0.25 SENSITIVE Sensitive     GENTAMICIN <=1 SENSITIVE Sensitive     IMIPENEM 1 SENSITIVE Sensitive     PIP/TAZO 8 SENSITIVE Sensitive     CEFEPIME 2 SENSITIVE Sensitive     * FEW PSEUDOMONAS AERUGINOSA     Medications:    baclofen  5 mg Oral TID   gabapentin  100 mg Oral BID   insulin aspart  0-6 Units Subcutaneous TID WC   morphine  45 mg Oral Q12H   senna-docusate  1 tablet Oral BID   Continuous Infusions:  sodium chloride Stopped (04/23/22 1631)   ceFEPime (MAXIPIME) IV 2 g (05/01/22 [id number])   vancomycin 750 mg (05/01/22 1152)  LOS: 9 days   Marinda Elk  [facility]  05/01/2022, 12:55 PM

## 2022-05-01 NOTE — Progress Notes (Signed)
      INFECTIOUS DISEASE ATTENDING ADDENDUM:   Date: 05/01/2022  Patient name: John Ware  Medical record number: [id number]  Date of birth: 1966-05-14   Patient was having MRI when we came by to round.  We decided to change his vancomycin to doxycyline to reduce his number of IV abx, esp since the pseudomonas seems to be the acute pathogen in his spine  His MRI is worrisome and I expect he will need Neurosurgery again--[name] is following closely.    Paulette Blanch Dam 05/01/2022, 2:45 PM

## 2022-05-01 NOTE — Progress Notes (Signed)
Patient ID: John Ware, male   DOB: 1966-04-25, 56 y.o.   MRN: [id number] [name] vital signs remained stable.  He notes he still has same strength in his legs but it has not improved any he still has some numbness much as he did when he first presented to the hospital.  He feels that his legs have not changed in their functional status.  His back pain is about the same also.  On examination the mass in his thoracolumbar spine is also about the same with significant raised area but no obvious fluctuance.  Seems that John Ware's condition has not improved or worsened but the concern is whether he is actually making any progress.  I have suggested that we should repeat the MRI scan of the thoracic spine to see if there is any significant changes.  The findings could guide whether any surgical intervention should be considered.  I also discussed with the nurse whether John Ware could have a shower.  We will see if this can be arranged for him.

## 2022-05-02 ENCOUNTER — Inpatient Hospital Stay (HOSPITAL_COMMUNITY)
Admission: EM | Disposition: A | Discharge status: Rehabilitation Facility | Payer: Self-pay | Source: Home / Self Care | Attending: Family Medicine

## 2022-05-02 ENCOUNTER — Inpatient Hospital Stay (HOSPITAL_COMMUNITY): Payer: Commercial Managed Care - PPO | Admitting: Certified Registered"

## 2022-05-02 ENCOUNTER — Inpatient Hospital Stay (HOSPITAL_COMMUNITY): Payer: Commercial Managed Care - PPO

## 2022-05-02 ENCOUNTER — Other Ambulatory Visit: Payer: Self-pay

## 2022-05-02 ENCOUNTER — Encounter (HOSPITAL_COMMUNITY): Payer: Self-pay | Admitting: Internal Medicine

## 2022-05-02 DIAGNOSIS — E1169 Type 2 diabetes mellitus with other specified complication: Secondary | ICD-10-CM

## 2022-05-02 DIAGNOSIS — Z87891 Personal history of nicotine dependence: Secondary | ICD-10-CM

## 2022-05-02 DIAGNOSIS — Z794 Long term (current) use of insulin: Secondary | ICD-10-CM

## 2022-05-02 DIAGNOSIS — G061 Intraspinal abscess and granuloma: Secondary | ICD-10-CM | POA: Diagnosis not present

## 2022-05-02 DIAGNOSIS — M4624 Osteomyelitis of vertebra, thoracic region: Secondary | ICD-10-CM

## 2022-05-02 DIAGNOSIS — M4644 Discitis, unspecified, thoracic region: Secondary | ICD-10-CM | POA: Diagnosis not present

## 2022-05-02 DIAGNOSIS — M869 Osteomyelitis, unspecified: Secondary | ICD-10-CM | POA: Diagnosis not present

## 2022-05-02 DIAGNOSIS — I10 Essential (primary) hypertension: Secondary | ICD-10-CM

## 2022-05-02 DIAGNOSIS — M545 Low back pain, unspecified: Secondary | ICD-10-CM

## 2022-05-02 DIAGNOSIS — I5032 Chronic diastolic (congestive) heart failure: Secondary | ICD-10-CM | POA: Diagnosis not present

## 2022-05-02 DIAGNOSIS — M4645 Discitis, unspecified, thoracolumbar region: Secondary | ICD-10-CM | POA: Diagnosis not present

## 2022-05-02 DIAGNOSIS — G8929 Other chronic pain: Secondary | ICD-10-CM | POA: Diagnosis not present

## 2022-05-02 DIAGNOSIS — M4625 Osteomyelitis of vertebra, thoracolumbar region: Secondary | ICD-10-CM | POA: Diagnosis not present

## 2022-05-02 DIAGNOSIS — R200 Anesthesia of skin: Secondary | ICD-10-CM

## 2022-05-02 HISTORY — PX: LUMBAR LAMINECTOMY FOR EPIDURAL ABSCESS: SHX5956

## 2022-05-02 LAB — SURGICAL PCR SCREEN
MRSA, PCR: NEGATIVE
Staphylococcus aureus: NEGATIVE

## 2022-05-02 LAB — GLUCOSE, CAPILLARY
Glucose-Capillary: 134 mg/dL — ABNORMAL HIGH (ref 70–99)
Glucose-Capillary: 140 mg/dL — ABNORMAL HIGH (ref 70–99)
Glucose-Capillary: 111 mg/dL — ABNORMAL HIGH (ref 70–99)

## 2022-05-02 SURGERY — LUMBAR LAMINECTOMY FOR EPIDURAL ABSCESS
Anesthesia: General | Site: Spine Thoracic

## 2022-05-02 MED ORDER — MENTHOL 3 MG MT LOZG: 1.0000 | LOZENGE | OROMUCOSAL | Status: DC | PRN

## 2022-05-02 MED ORDER — FENTANYL CITRATE (PF) 100 MCG/2ML IJ SOLN
25.0000 ug | INTRAMUSCULAR | Status: DC | PRN
  Administered 2022-05-02 (×2): 50 ug via INTRAVENOUS

## 2022-05-02 MED ORDER — DEXAMETHASONE SODIUM PHOSPHATE 10 MG/ML IJ SOLN
INTRAMUSCULAR | Status: AC
  Filled 2022-05-02: qty 2

## 2022-05-02 MED ORDER — HYDROMORPHONE HCL 1 MG/ML IJ SOLN
INTRAMUSCULAR | Status: AC
  Filled 2022-05-02: qty 1

## 2022-05-02 MED ORDER — PROPOFOL 10 MG/ML IV BOLUS
INTRAVENOUS | Status: DC | PRN
  Administered 2022-05-02: 100 mg via INTRAVENOUS
  Administered 2022-05-02 (×4): 50 mg via INTRAVENOUS

## 2022-05-02 MED ORDER — KETAMINE HCL 10 MG/ML IJ SOLN
INTRAMUSCULAR | Status: DC | PRN
  Administered 2022-05-02 (×5): 10 mg via INTRAVENOUS

## 2022-05-02 MED ORDER — LACTATED RINGERS IV SOLN: INTRAVENOUS | Status: DC

## 2022-05-02 MED ORDER — ROCURONIUM BROMIDE 10 MG/ML (PF) SYRINGE
PREFILLED_SYRINGE | INTRAVENOUS | Status: DC | PRN
  Administered 2022-05-02: 50 mg via INTRAVENOUS
  Administered 2022-05-02: 30 mg via INTRAVENOUS

## 2022-05-02 MED ORDER — MIDAZOLAM HCL 2 MG/2ML IJ SOLN
INTRAMUSCULAR | Status: DC | PRN
  Administered 2022-05-02: 2 mg via INTRAVENOUS

## 2022-05-02 MED ORDER — FENTANYL CITRATE (PF) 100 MCG/2ML IJ SOLN
INTRAMUSCULAR | Status: AC
  Filled 2022-05-02: qty 2

## 2022-05-02 MED ORDER — BISACODYL 10 MG RE SUPP
10.0000 mg | Freq: Every day | RECTAL | Status: DC | PRN
  Filled 2022-05-02: qty 1

## 2022-05-02 MED ORDER — PROPOFOL 10 MG/ML IV BOLUS
INTRAVENOUS | Status: AC
  Filled 2022-05-02: qty 20

## 2022-05-02 MED ORDER — OXYCODONE HCL 5 MG/5ML PO SOLN: 5.0000 mg | Freq: Once | ORAL | Status: DC | PRN

## 2022-05-02 MED ORDER — DEXAMETHASONE SODIUM PHOSPHATE 10 MG/ML IJ SOLN
INTRAMUSCULAR | Status: AC
  Filled 2022-05-02: qty 1

## 2022-05-02 MED ORDER — LIDOCAINE-EPINEPHRINE 1 %-1:100000 IJ SOLN
INTRAMUSCULAR | Status: AC
  Filled 2022-05-02: qty 1

## 2022-05-02 MED ORDER — DEXAMETHASONE SODIUM PHOSPHATE 10 MG/ML IJ SOLN
6.0000 mg | Freq: Once | INTRAMUSCULAR | Status: AC
Start: 2022-05-02 — End: 2022-05-02
  Administered 2022-05-02: 6 mg via INTRAVENOUS

## 2022-05-02 MED ORDER — CHLORHEXIDINE GLUCONATE 0.12 % MT SOLN
OROMUCOSAL | Status: AC
  Administered 2022-05-02: 15 mL via OROMUCOSAL
  Filled 2022-05-02: qty 15

## 2022-05-02 MED ORDER — 0.9 % SODIUM CHLORIDE (POUR BTL) OPTIME
TOPICAL | Status: DC | PRN
  Administered 2022-05-02: 1000 mL

## 2022-05-02 MED ORDER — ACETAMINOPHEN 10 MG/ML IV SOLN
INTRAVENOUS | Status: AC
  Filled 2022-05-02: qty 100

## 2022-05-02 MED ORDER — ROCURONIUM BROMIDE 10 MG/ML (PF) SYRINGE
PREFILLED_SYRINGE | INTRAVENOUS | Status: AC
  Filled 2022-05-02: qty 30

## 2022-05-02 MED ORDER — KETAMINE HCL 50 MG/5ML IJ SOSY
PREFILLED_SYRINGE | INTRAMUSCULAR | Status: AC
  Filled 2022-05-02: qty 5

## 2022-05-02 MED ORDER — SODIUM CHLORIDE 0.9 % IV SOLN: 250.0000 mL | INTRAVENOUS | Status: DC

## 2022-05-02 MED ORDER — PHENYLEPHRINE 80 MCG/ML (10ML) SYRINGE FOR IV PUSH (FOR BLOOD PRESSURE SUPPORT)
PREFILLED_SYRINGE | INTRAVENOUS | Status: AC
  Filled 2022-05-02: qty 30

## 2022-05-02 MED ORDER — MIDAZOLAM HCL 2 MG/2ML IJ SOLN
INTRAMUSCULAR | Status: AC
  Filled 2022-05-02: qty 2

## 2022-05-02 MED ORDER — DOCUSATE SODIUM 100 MG PO CAPS: 100.0000 mg | ORAL_CAPSULE | Freq: Two times a day (BID) | ORAL | Status: DC

## 2022-05-02 MED ORDER — PHENOL 1.4 % MT LIQD: 1.0000 | OROMUCOSAL | Status: DC | PRN

## 2022-05-02 MED ORDER — PHENYLEPHRINE 80 MCG/ML (10ML) SYRINGE FOR IV PUSH (FOR BLOOD PRESSURE SUPPORT)
PREFILLED_SYRINGE | INTRAVENOUS | Status: DC | PRN
  Administered 2022-05-02 (×2): 120 ug via INTRAVENOUS

## 2022-05-02 MED ORDER — ACETAMINOPHEN 325 MG PO TABS: 650.0000 mg | ORAL_TABLET | ORAL | Status: DC | PRN

## 2022-05-02 MED ORDER — ONDANSETRON HCL 4 MG/2ML IJ SOLN: 4.0000 mg | Freq: Once | INTRAMUSCULAR | Status: DC | PRN

## 2022-05-02 MED ORDER — HYDROMORPHONE HCL 1 MG/ML IJ SOLN: 0.2500 mg | INTRAMUSCULAR | Status: DC | PRN

## 2022-05-02 MED ORDER — ONDANSETRON HCL 4 MG/2ML IJ SOLN
INTRAMUSCULAR | Status: DC | PRN
  Administered 2022-05-02: 4 mg via INTRAVENOUS

## 2022-05-02 MED ORDER — ACETAMINOPHEN 10 MG/ML IV SOLN
1000.0000 mg | Freq: Once | INTRAVENOUS | Status: DC | PRN
  Administered 2022-05-02: 1000 mg via INTRAVENOUS

## 2022-05-02 MED ORDER — ORAL CARE MOUTH RINSE: 15.0000 mL | Freq: Once | OROMUCOSAL | Status: AC

## 2022-05-02 MED ORDER — SUGAMMADEX SODIUM 200 MG/2ML IV SOLN
INTRAVENOUS | Status: DC | PRN
  Administered 2022-05-02: 200 mg via INTRAVENOUS

## 2022-05-02 MED ORDER — MORPHINE SULFATE (PF) 2 MG/ML IV SOLN
2.0000 mg | INTRAVENOUS | Status: DC | PRN
  Administered 2022-05-02 (×2): 4 mg via INTRAVENOUS
  Administered 2022-05-03: 2 mg via INTRAVENOUS
  Administered 2022-05-03 (×2): 4 mg via INTRAVENOUS
  Administered 2022-05-03: 2 mg via INTRAVENOUS
  Administered 2022-05-03: 4 mg via INTRAVENOUS
  Administered 2022-05-04: 2 mg via INTRAVENOUS
  Administered 2022-05-04: 4 mg via INTRAVENOUS
  Administered 2022-05-04 (×2): 2 mg via INTRAVENOUS
  Administered 2022-05-04: 4 mg via INTRAVENOUS
  Administered 2022-05-04: 2 mg via INTRAVENOUS
  Administered 2022-05-05 (×4): 4 mg via INTRAVENOUS
  Administered 2022-05-05: 2 mg via INTRAVENOUS
  Administered 2022-05-05 – 2022-05-06 (×2): 4 mg via INTRAVENOUS
  Administered 2022-05-06 (×3): 2 mg via INTRAVENOUS
  Administered 2022-05-06 (×2): 4 mg via INTRAVENOUS
  Administered 2022-05-07 – 2022-05-08 (×9): 2 mg via INTRAVENOUS
  Filled 2022-05-02 (×2): qty 1
  Filled 2022-05-02 (×2): qty 2
  Filled 2022-05-02: qty 1
  Filled 2022-05-02: qty 2
  Filled 2022-05-02: qty 1
  Filled 2022-05-02 (×5): qty 2
  Filled 2022-05-02 (×2): qty 1
  Filled 2022-05-02: qty 2
  Filled 2022-05-02 (×3): qty 1
  Filled 2022-05-02: qty 2
  Filled 2022-05-02: qty 1
  Filled 2022-05-02: qty 2
  Filled 2022-05-02 (×3): qty 1
  Filled 2022-05-02 (×3): qty 2
  Filled 2022-05-02: qty 1
  Filled 2022-05-02: qty 2
  Filled 2022-05-02 (×6): qty 1
  Filled 2022-05-02: qty 2

## 2022-05-02 MED ORDER — HYDROMORPHONE HCL 1 MG/ML IJ SOLN
0.2500 mg | INTRAMUSCULAR | Status: DC | PRN
  Administered 2022-05-02 (×4): 0.5 mg via INTRAVENOUS

## 2022-05-02 MED ORDER — PHENYLEPHRINE HCL-NACL 20-0.9 MG/250ML-% IV SOLN: INTRAVENOUS | Status: DC | PRN

## 2022-05-02 MED ORDER — THROMBIN 5000 UNITS EX SOLR: OROMUCOSAL | Status: DC | PRN

## 2022-05-02 MED ORDER — SODIUM CHLORIDE 0.9% FLUSH
3.0000 mL | Freq: Two times a day (BID) | INTRAVENOUS | Status: DC
  Administered 2022-05-04 – 2022-05-05 (×3): 3 mL via INTRAVENOUS

## 2022-05-02 MED ORDER — ONDANSETRON HCL 4 MG/2ML IJ SOLN: 4.0000 mg | Freq: Four times a day (QID) | INTRAMUSCULAR | Status: DC | PRN

## 2022-05-02 MED ORDER — BUPIVACAINE HCL (PF) 0.5 % IJ SOLN
INTRAMUSCULAR | Status: AC
  Filled 2022-05-02: qty 30

## 2022-05-02 MED ORDER — FENTANYL CITRATE (PF) 250 MCG/5ML IJ SOLN
INTRAMUSCULAR | Status: AC
  Filled 2022-05-02: qty 5

## 2022-05-02 MED ORDER — LIDOCAINE 2% (20 MG/ML) 5 ML SYRINGE
INTRAMUSCULAR | Status: AC
  Filled 2022-05-02: qty 10

## 2022-05-02 MED ORDER — ONDANSETRON HCL 4 MG PO TABS
4.0000 mg | ORAL_TABLET | Freq: Four times a day (QID) | ORAL | Status: DC | PRN
  Filled 2022-05-02: qty 1

## 2022-05-02 MED ORDER — FLEET ENEMA 7-19 GM/118ML RE ENEM: 1.0000 | ENEMA | Freq: Once | RECTAL | Status: DC | PRN

## 2022-05-02 MED ORDER — POLYETHYLENE GLYCOL 3350 17 G PO PACK: 17.0000 g | PACK | Freq: Every day | ORAL | Status: DC | PRN

## 2022-05-02 MED ORDER — SENNA 8.6 MG PO TABS: 1.0000 | ORAL_TABLET | Freq: Two times a day (BID) | ORAL | Status: DC

## 2022-05-02 MED ORDER — BUPIVACAINE HCL (PF) 0.5 % IJ SOLN
INTRAMUSCULAR | Status: DC | PRN
  Administered 2022-05-02: 5 mL

## 2022-05-02 MED ORDER — LIDOCAINE-EPINEPHRINE 1 %-1:100000 IJ SOLN
INTRAMUSCULAR | Status: DC | PRN
  Administered 2022-05-02: 5 mL

## 2022-05-02 MED ORDER — ACETAMINOPHEN 650 MG RE SUPP: 650.0000 mg | RECTAL | Status: DC | PRN

## 2022-05-02 MED ORDER — SODIUM CHLORIDE 0.9% FLUSH: 3.0000 mL | INTRAVENOUS | Status: DC | PRN

## 2022-05-02 MED ORDER — CHLORHEXIDINE GLUCONATE 0.12 % MT SOLN: 15.0000 mL | Freq: Once | OROMUCOSAL | Status: AC

## 2022-05-02 MED ORDER — OXYCODONE HCL 5 MG PO TABS: 5.0000 mg | ORAL_TABLET | Freq: Once | ORAL | Status: DC | PRN

## 2022-05-02 MED ORDER — ONDANSETRON HCL 4 MG/2ML IJ SOLN
INTRAMUSCULAR | Status: AC
  Filled 2022-05-02: qty 6

## 2022-05-02 MED ORDER — THROMBIN 5000 UNITS EX SOLR
CUTANEOUS | Status: AC
Start: 2022-05-02 — End: ?
  Filled 2022-05-02: qty 5000

## 2022-05-02 MED ORDER — AMISULPRIDE (ANTIEMETIC) 5 MG/2ML IV SOLN: 10.0000 mg | Freq: Once | INTRAVENOUS | Status: DC | PRN

## 2022-05-02 MED ORDER — THROMBIN 5000 UNITS EX SOLR
CUTANEOUS | Status: AC
  Filled 2022-05-02: qty 10000

## 2022-05-02 MED ORDER — ALUM & MAG HYDROXIDE-SIMETH 200-200-20 MG/5ML PO SUSP: 30.0000 mL | Freq: Four times a day (QID) | ORAL | Status: DC | PRN

## 2022-05-02 MED ORDER — FENTANYL CITRATE (PF) 250 MCG/5ML IJ SOLN
INTRAMUSCULAR | Status: DC | PRN
Start: 2022-05-02 — End: 2022-05-02
  Administered 2022-05-02: 100 ug via INTRAVENOUS
  Administered 2022-05-02 (×3): 50 ug via INTRAVENOUS

## 2022-05-02 MED ORDER — THROMBIN (RECOMBINANT) 5000 UNITS EX SOLR: CUTANEOUS | Status: DC | PRN

## 2022-05-02 SURGICAL SUPPLY — 48 items
ADH SKN CLS APL DERMABOND .7 (GAUZE/BANDAGES/DRESSINGS) ×1
BAG COUNTER SPONGE SURGICOUNT (BAG) ×1 IMPLANT
BAG SPNG CNTER NS LX DISP (BAG) ×1
BAND INSRT 18 STRL LF DISP RB (MISCELLANEOUS)
BAND RUBBER #18 3X1/16 STRL (MISCELLANEOUS) IMPLANT
BLADE CLIPPER SURG (BLADE) IMPLANT
BUR ACORN 6.0 (BURR) IMPLANT
BUR MATCHSTICK NEURO 3.0 LAGG (BURR) ×1 IMPLANT
CANISTER SUCT 3000ML PPV (MISCELLANEOUS) ×1 IMPLANT
DERMABOND ADVANCED (GAUZE/BANDAGES/DRESSINGS) ×1
DERMABOND ADVANCED .7 DNX12 (GAUZE/BANDAGES/DRESSINGS) ×1 IMPLANT
DRAPE HALF SHEET 40X57 (DRAPES) IMPLANT
DRAPE LAPAROTOMY 100X72X124 (DRAPES) ×1 IMPLANT
DRAPE MICROSCOPE SLANT 54X150 (MISCELLANEOUS) IMPLANT
DURAPREP 26ML APPLICATOR (WOUND CARE) ×1 IMPLANT
ELECT REM PT RETURN 9FT ADLT (ELECTROSURGICAL) ×1
ELECTRODE REM PT RTRN 9FT ADLT (ELECTROSURGICAL) ×1 IMPLANT
GAUZE 4X4 16PLY ~~LOC~~+RFID DBL (SPONGE) IMPLANT
GAUZE SPONGE 4X4 12PLY STRL (GAUZE/BANDAGES/DRESSINGS) ×1 IMPLANT
GAUZE SPONGE 4X4 12PLY STRL LF (GAUZE/BANDAGES/DRESSINGS) IMPLANT
GLOVE BIOGEL PI IND STRL 8.5 (GLOVE) ×1 IMPLANT
GLOVE ECLIPSE 8.5 STRL (GLOVE) ×1 IMPLANT
GOWN STRL REUS W/ TWL LRG LVL3 (GOWN DISPOSABLE) IMPLANT
GOWN STRL REUS W/ TWL XL LVL3 (GOWN DISPOSABLE) IMPLANT
GOWN STRL REUS W/TWL 2XL LVL3 (GOWN DISPOSABLE) ×1 IMPLANT
GOWN STRL REUS W/TWL LRG LVL3 (GOWN DISPOSABLE)
GOWN STRL REUS W/TWL XL LVL3 (GOWN DISPOSABLE)
HEMOSTAT POWDER KIT SURGIFOAM (HEMOSTASIS) IMPLANT
KIT BASIN OR (CUSTOM PROCEDURE TRAY) ×1 IMPLANT
KIT TURNOVER KIT B (KITS) ×1 IMPLANT
NDL SPNL 20GX3.5 QUINCKE YW (NEEDLE) IMPLANT
NEEDLE HYPO 22GX1.5 SAFETY (NEEDLE) ×1 IMPLANT
NEEDLE SPNL 20GX3.5 QUINCKE YW (NEEDLE) IMPLANT
NS IRRIG 1000ML POUR BTL (IV SOLUTION) ×1 IMPLANT
PACK LAMINECTOMY NEURO (CUSTOM PROCEDURE TRAY) ×1 IMPLANT
PAD ARMBOARD 7.5X6 YLW CONV (MISCELLANEOUS) ×3 IMPLANT
PATTIES SURGICAL .5 X1 (DISPOSABLE) ×1 IMPLANT
SPIKE FLUID TRANSFER (MISCELLANEOUS) ×1 IMPLANT
SPONGE SURGIFOAM ABS GEL SZ50 (HEMOSTASIS) ×1 IMPLANT
SUT VIC AB 1 CT1 18XBRD ANBCTR (SUTURE) ×1 IMPLANT
SUT VIC AB 1 CT1 8-18 (SUTURE) ×1
SUT VIC AB 2-0 CP2 18 (SUTURE) ×1 IMPLANT
SUT VIC AB 3-0 SH 8-18 (SUTURE) ×1 IMPLANT
SUT VIC AB 4-0 RB1 18 (SUTURE) ×1 IMPLANT
TAPE CLOTH SURG 4X10 WHT LF (GAUZE/BANDAGES/DRESSINGS) IMPLANT
TOWEL GREEN STERILE (TOWEL DISPOSABLE) ×1 IMPLANT
TOWEL GREEN STERILE FF (TOWEL DISPOSABLE) ×1 IMPLANT
WATER STERILE IRR 1000ML POUR (IV SOLUTION) ×1 IMPLANT

## 2022-05-02 NOTE — Progress Notes (Signed)
[facility] PROGRESS NOTE    Progress Note  John Ware  [id number] DOB: 1966-07-22 DOA: 04/22/2022 PCP: Sharion Balloon, FNP     Brief Narrative:   John Ware is an 56 y.o. male past medical history of tricuspid valve endocarditis, discitis and osteomyelitis, diabetes mellitus type 2, chronic diastolic heart failure comes in for progressive back pain was discharged home on 06/11/2021 after prolonged hospitalization for MSSA bacteremia and tricuspid endocarditis.  Underwent T10-T11 disc aspiration by IR, who grew Pseudomonas he is currently on bank and cefepime.   Assessment/Plan:   Osteomyelitis of vertebra of thoracolumbar region [facility]) Patient underwent T10-T11 disc aspiration, culture grew Pseudomonas ID recommended IV vancomycin and cefepime for minimum of 6 weeks postoperatively if surgery is performed followed by lifelong doxycycline and ciprofloxacin. Neurosurgery recommended an MRI of the spine that showed persistent osteomyelitis with abnormal signal on T9-T12, they recommended surgical intervention on 05/02/2022.  Diabetes mellitus type 2: With an A1c of 8.4, relatively well controlled continue sliding scale insulin.  Chronic diastolic heart failure: Appears euvolemic continue current medication.  Chronic pain syndrome: Continue baclofen MS Contin and oxycodone. Continue IV morphine for breakthrough pain.  Skin rash: Improved with hydroxyzine.  Stage I sacral decubitus ulcer of the left buttock RN Pressure Injury Documentation: Pressure Injury 06/11/21 Buttocks Left Stage 1 -  Intact skin with non-blanchable redness of a localized area usually over a bony prominence. (Active)  06/11/21 1617  Location: Buttocks  Location Orientation: Left  Staging: Stage 1 -  Intact skin with non-blanchable redness of a localized area usually over a bony prominence.  Wound Description (Comments):   Present on Admission: Yes     Pressure Injury 06/11/21 Buttocks  Right Stage 1 -  Intact skin with non-blanchable redness of a localized area usually over a bony prominence. (Active)  06/11/21 1618  Location: Buttocks  Location Orientation: Right  Staging: Stage 1 -  Intact skin with non-blanchable redness of a localized area usually over a bony prominence.  Wound Description (Comments):   Present on Admission: Yes    Estimated body mass index is 23.47 kg/m as calculated from the following:   Height as of this encounter: 5\' 10"  (1.778 m).   Weight as of this encounter: 74.2 kg.  DVT prophylaxis: lovenox Family Communication:Wife Status is: Inpatient Remains inpatient appropriate because: Osteomyelitis and abscess of the back    Code Status:     Code Status Orders  (From admission, onward)           Start     Ordered   04/22/22 2233  Full code  Continuous        04/22/22 2233           Code Status History     Date Active Date Inactive Code Status Order ID Comments User Context   06/11/2021 1552 06/26/2021 1813 Full Code [id number]  Flora Lipps Inpatient   05/28/2021 1925 06/11/2021 1538 Full Code [id number]  Tawni Millers, MD ED   01/31/2021 1453 03/15/2021 2102 Full Code [id number]  Jonnie Finner, DO Inpatient   10/14/2017 1803 10/15/2017 1738 Full Code [id number]  Kristeen Miss, MD Inpatient   12/13/2014 1654 12/14/2014 1323 Full Code [id number]  Kristeen Miss, MD Inpatient         IV Access:   Peripheral IV   Procedures and diagnostic studies:   MR THORACIC SPINE W WO CONTRAST  Result Date: 05/01/2022 CLINICAL DATA:  Osteomyelitis EXAM: MRI THORACIC WITHOUT  AND WITH CONTRAST TECHNIQUE: Multiplanar and multiecho pulse sequences of the thoracic spine were obtained without and with intravenous contrast. CONTRAST:  7.46mL GADAVIST GADOBUTROL 1 MMOL/ML IV SOLN COMPARISON:  Thoracic spine MRI 04/23/2022 FINDINGS: Alignment:  Normal. Vertebrae: There is STIR signal abnormality and enhancement in the T9 through T12  vertebral bodies, worst at T10 and T11, increased at T9 and T12 since 04/23/2022 there is fluid signal and enhancement in the intervening T10-T11 disc space. The other disc spaces are spared. There is abnormal enhancement about the left T10-T11 facet joint which remains concerning for septic arthritis (21-15, 24-41). There is epidural phlegmon extending along the posterior endplates at T9 through T12 measuring up to proximally 4 mm in maximal thickness with small foci of non enhancement (24-36, 24-38), which remains consistent with epidural phlegmon/abscess. This again results in severe spinal canal stenosis at T10-T11 and T11-T12 with cord compression and now with possible cord signal abnormality (18-35) which is slightly increased in conspicuity since the prior study. Enhancing tissue in the paraspinal soft tissues from T9 through T12 likely also reflecting phlegmon is not significantly changed. Sequela of prior discitis/osteomyelitis at T3-T4 is unchanged with unchanged obliteration of the intervening disc space. Marrow signal at the other levels is within normal limits. Cord: Possible cord signal abnormality at T10-T11 as above. The cord above this level is normal signal and morphology. Paraspinal and other soft tissues: Again seen is a fluid collection in the posterior soft tissues to the left of midline at the T11-T12 level measuring 4.2 cm TV x 1.1 cm AP by 3.3 cm cc, not significantly changed. There is no convincing peripheral enhancement. Diffuse enhancement in the posterior soft tissues throughout the midthoracic spine is stable to slightly decreased. Abnormal enhancement the paraspinal soft tissues from T9 through T12 is not significantly changed, as above. There are small left and trace right pleural effusions, not significantly changed. Disc levels: Scattered disc protrusions are again seen, most notable at T4-T5, T5-T6, and T8-T9 resulting in mild spinal canal stenosis with flattening of the ventral  cord surface. Multilevel facet arthropathy with neural foraminal stenosis most advanced at T10-T11 and T11-T12 is similar to the prior study. IMPRESSION: 1. Findings consistent with persistent osteomyelitis at T9 through T12, with signal abnormality having worsened at T9 and T12 since 04/23/2022. 2. Findings remain concerning for left septic arthritis at T11-T12. 3. 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 4. Unchanged enhancement of the paraspinal soft tissues at T9 through T12 without evidence of paraspinal abscess. 5. Unchanged fluid collection in the left posterior paraspinal soft tissues at T11-T12. 6. Stable to slightly decreased diffuse enhancement in the posterior soft tissues extending from T2 through T7. 7. Unchanged small left and trace right pleural effusions. Electronically Signed   By: Lesia Hausen M.D.   On: 05/01/2022 13:45     Medical Consultants:   None.   Subjective:    Delano L Tandy Lasix pain is controlled.  Objective:    Vitals:   05/02/22 0015 05/02/22 0327 05/02/22 0500 05/02/22 0759  BP: 106/70 109/81  115/79  Pulse: 68 61  (!) 55  Resp: 18 18  18   Temp: 98.4 F (36.9 C) 98.4 F (36.9 C)  98 F (36.7 C)  TempSrc:  Oral  Oral  SpO2: 99% 99%  98%  Weight:  74.2 kg   Height:       SpO2: 98 % O2 Flow Rate (L/min): 2 L/min   Intake/Output Summary (Last 24 hours) at 05/02/2022 0845 Last data filed at 05/01/2022 1700 Gross per 24 hour  Intake 350 ml  Output 700 ml  Net -350 ml    Filed Weights   04/23/22 1441 04/24/22 0423 05/02/22 0500  Weight: 76 kg 76.7 kg 74.2 kg    Exam: General exam: In no acute distress. Respiratory system: Good air movement and clear to auscultation. Cardiovascular system: S1 & S2 heard, RRR. No JVD. Gastrointestinal  system: Abdomen is nondistended, soft and nontender.  Extremities: No pedal edema. Skin: No rashes, lesions or ulcers Psychiatry: Judgement and insight appear normal. Mood & affect appropriate.   Data Reviewed:    Labs: Basic Metabolic Panel: Recent Labs  Lab 04/26/22 0559 04/27/22 0441 04/28/22 0131 04/30/22 0420  NA 136 134* 135 136  K 4.3 4.2 4.4 4.2  CL 99 101 101 103  CO2 27 26 23 24   GLUCOSE 112* 137* 120* 117*  BUN 7 7 8 10   CREATININE 0.61 0.63 0.65 0.64  CALCIUM 9.0 8.8* 9.1 9.2    GFR Estimated Creatinine Clearance: 109 mL/min (by C-G formula based on SCr of 0.64 mg/dL). Liver Function Tests: No results for input(s): "AST", "ALT", "ALKPHOS", "BILITOT", "PROT", "ALBUMIN" in the last 168 hours. No results for input(s): "LIPASE", "AMYLASE" in the last 168 hours. No results for input(s): "AMMONIA" in the last 168 hours. Coagulation profile No results for input(s): "INR", "PROTIME" in the last 168 hours. COVID-19 Labs  No results for input(s): "DDIMER", "FERRITIN", "LDH", "CRP" in the last 72 hours.  Lab Results  Component Value Date   SARSCOV2NAA NEGATIVE 05/28/2021   SARSCOV2NAA POSITIVE (A) 01/31/2021   [place] NEGATIVE 08/23/2020    CBC: Recent Labs  Lab 04/26/22 0559 04/27/22 0441 04/28/22 0131  WBC 10.3 10.7* 11.5*  HGB 9.3* 9.5* 9.7*  HCT 29.0* 28.6* 30.4*  MCV 79.7* 78.1* 79.0*  PLT 503* 501* 526*    Cardiac Enzymes: No results for input(s): "CKTOTAL", "CKMB", "CKMBINDEX", "TROPONINI" in the last 168 hours. BNP (last 3 results) No results for input(s): "PROBNP" in the last 8760 hours. CBG: Recent Labs  Lab 04/30/22 1601 05/01/22 1639  GLUCAP 94 143*    D-Dimer: No results for input(s): "DDIMER" in the last 72 hours. Hgb A1c: No results for input(s): "HGBA1C" in the last 72 hours. Lipid Profile: No results for input(s): "CHOL", "HDL", "LDLCALC", "TRIG", "CHOLHDL", "LDLDIRECT" in the last 72 hours. Thyroid function  studies: No results for input(s): "TSH", "T4TOTAL", "T3FREE", "THYROIDAB" in the last 72 hours.  Invalid input(s): "FREET3" Anemia work up: No results for input(s): "VITAMINB12", "FOLATE", "FERRITIN", "TIBC", "IRON", "RETICCTPCT" in the last 72 hours. Sepsis Labs: Recent Labs  Lab 04/26/22 0559 04/27/22 0441 04/28/22 0131  WBC 10.3 10.7* 11.5*    Microbiology Recent Results (from the past 240 hour(s))  Blood culture (routine x 2)     Status: None   Collection Time: 04/22/22  2:52 PM   Specimen: BLOOD  Result Value Ref Range Status   Specimen Description BLOOD SITE NOT SPECIFIED  Final   Special Requests   Final    BOTTLES DRAWN AEROBIC AND ANAEROBIC Blood Culture adequate volume   Culture   Final    NO GROWTH 5 DAYS Performed at [facility], [address] [place], [place] [address]    Report Status 04/27/2022 FINAL  Final  Blood culture (routine x 2)     Status: Abnormal   Collection Time: 04/22/22  7:24 PM   Specimen: BLOOD  Result Value Ref Range Status   Specimen Description BLOOD LEFT ANTECUBITAL  Final   Special Requests   Final    BOTTLES DRAWN AEROBIC AND ANAEROBIC Blood Culture results may not be optimal due to an excessive volume of blood received in culture bottles   Culture  Setup Time   Final    GRAM POSITIVE COCCI IN CLUSTERS IN BOTH AEROBIC AND ANAEROBIC BOTTLES CRITICAL RESULT CALLED TO, READ BACK BY AND VERIFIED WITH: PHARMD LISA CURRAN ON 04/23/22 @ 1951 BY DRT    Culture (A)  Final    STAPHYLOCOCCUS PSEUDINTERMEDIUS STAPHYLOCOCCUS EPIDERMIDIS THE SIGNIFICANCE OF ISOLATING THIS ORGANISM FROM A SINGLE SET OF BLOOD CULTURES WHEN MULTIPLE SETS ARE DRAWN IS UNCERTAIN. PLEASE NOTIFY THE MICROBIOLOGY DEPARTMENT WITHIN ONE WEEK IF SPECIATION AND SENSITIVITIES ARE REQUIRED. Performed at [facility], [address] [place], [place] [address]    Report Status 04/25/2022 FINAL  Final  Blood Culture ID Panel (Reflexed)     Status: Abnormal    Collection Time: 04/22/22  7:24 PM  Result Value Ref Range Status   Enterococcus faecalis NOT DETECTED NOT DETECTED Final   Enterococcus Faecium NOT DETECTED NOT DETECTED Final   Listeria monocytogenes NOT DETECTED NOT DETECTED Final   Staphylococcus species DETECTED (A) NOT DETECTED Final    Comment: CRITICAL RESULT CALLED TO, READ BACK BY AND VERIFIED WITH: PHARMD LISA CURRAN ON 04/23/22 @ 1951 BY DRT    Staphylococcus aureus (BCID) NOT DETECTED NOT DETECTED Final   Staphylococcus epidermidis DETECTED (A) NOT DETECTED Final    Comment: Methicillin (oxacillin) resistant coagulase negative staphylococcus. Possible blood culture contaminant (unless isolated from more than one blood culture draw or clinical case suggests pathogenicity). No antibiotic treatment is indicated for blood  culture contaminants. CRITICAL RESULT CALLED TO, READ BACK BY AND VERIFIED WITH: PHARMD LISA CURRAN ON 04/23/22 @ 1951 BY DRT    Staphylococcus lugdunensis NOT DETECTED NOT DETECTED Final   Streptococcus species NOT DETECTED NOT DETECTED Final   Streptococcus agalactiae NOT DETECTED NOT DETECTED Final   Streptococcus pneumoniae NOT DETECTED NOT DETECTED Final   Streptococcus pyogenes NOT DETECTED NOT DETECTED Final   A.calcoaceticus-baumannii NOT DETECTED NOT DETECTED Final   Bacteroides fragilis NOT DETECTED NOT DETECTED Final   Enterobacterales NOT DETECTED NOT DETECTED Final   Enterobacter cloacae complex NOT DETECTED NOT DETECTED Final   Escherichia coli NOT DETECTED NOT DETECTED Final   Klebsiella aerogenes NOT DETECTED NOT DETECTED Final   Klebsiella oxytoca NOT DETECTED NOT DETECTED Final   Klebsiella pneumoniae NOT DETECTED NOT DETECTED Final   Proteus species NOT DETECTED NOT DETECTED Final   Salmonella species NOT DETECTED NOT DETECTED Final   Serratia marcescens NOT DETECTED NOT DETECTED Final   Haemophilus influenzae NOT DETECTED NOT DETECTED Final   Neisseria meningitidis NOT DETECTED NOT  DETECTED Final   Pseudomonas aeruginosa NOT DETECTED NOT DETECTED Final   Stenotrophomonas maltophilia NOT DETECTED NOT DETECTED Final   Candida albicans NOT DETECTED NOT DETECTED Final   Candida auris NOT DETECTED NOT DETECTED Final   Candida glabrata NOT DETECTED NOT DETECTED Final   Candida krusei NOT DETECTED NOT DETECTED Final   Candida parapsilosis NOT DETECTED NOT DETECTED Final   Candida tropicalis NOT DETECTED NOT DETECTED Final   Cryptococcus neoformans/gattii NOT DETECTED NOT DETECTED Final   Methicillin resistance mecA/C DETECTED (A) NOT DETECTED Final  Comment: CRITICAL RESULT CALLED TO, READ BACK BY AND VERIFIED WITH: PHARMD LISA CURRAN ON 04/23/22 @ 1951 BY DRT Performed at [facility], [place] [address] [place], [place] [address]   Body fluid culture w Gram Stain     Status: Abnormal   Collection Time: 04/24/22 11:37 AM   Specimen: PATH Disc; Body Fluid  Result Value Ref Range Status   Specimen Description FLUID  Final   Special Requests  DISC  Final   Gram Stain   Final    RARE WBC PRESENT, PREDOMINANTLY PMN RARE GRAM VARIABLE ROD Performed at [place] [address], [place], [place] [address]    Culture PSEUDOMONAS AERUGINOSA (A)  Final   Report Status 04/26/2022 FINAL  Final   Organism ID, Bacteria PSEUDOMONAS AERUGINOSA  Final      Susceptibility   Pseudomonas aeruginosa - MIC*    CEFTAZIDIME 4 SENSITIVE Sensitive     CIPROFLOXACIN <=0.25 SENSITIVE Sensitive     GENTAMICIN <=1 SENSITIVE Sensitive     IMIPENEM 1 SENSITIVE Sensitive     PIP/TAZO 8 SENSITIVE Sensitive     CEFEPIME 2 SENSITIVE Sensitive     * PSEUDOMONAS AERUGINOSA  Aerobic/Anaerobic Culture w Gram Stain (surgical/deep wound)     Status: None   Collection Time: 04/25/22 12:10 PM   Specimen: Abscess  Result Value Ref Range Status   Specimen Description ABSCESS  Final   Special Requests NONE  Final   Gram Stain   Final    FEW WBC PRESENT, PREDOMINANTLY PMN NO ORGANISMS  SEEN    Culture   Final    FEW PSEUDOMONAS AERUGINOSA NO ANAEROBES ISOLATED Performed at [facility], [place] [address] [place], [place] [address]    Report Status 04/30/2022 FINAL  Final   Organism ID, Bacteria PSEUDOMONAS AERUGINOSA  Final      Susceptibility   Pseudomonas aeruginosa - MIC*    CEFTAZIDIME 4 SENSITIVE Sensitive     CIPROFLOXACIN <=0.25 SENSITIVE Sensitive     GENTAMICIN <=1 SENSITIVE Sensitive     IMIPENEM 1 SENSITIVE Sensitive     PIP/TAZO 8 SENSITIVE Sensitive     CEFEPIME 2 SENSITIVE Sensitive     * FEW PSEUDOMONAS AERUGINOSA     Medications:    baclofen  5 mg Oral TID   doxycycline  100 mg Oral Q12H   gabapentin  100 mg Oral BID   insulin aspart  0-6 Units Subcutaneous TID WC   morphine  45 mg Oral Q12H   senna-docusate  1 tablet Oral BID   Continuous Infusions:  sodium chloride Stopped (04/23/22 1631)   ceFEPime (MAXIPIME) IV 2 g (05/02/22 0709)      LOS: 10 days   Charlynne Cousins  [facility]  05/02/2022, 8:45 AM

## 2022-05-02 NOTE — Progress Notes (Signed)
Patient ID: John Ware, male   DOB: 05/25/1966, 56 y.o.   MRN: [id number] Patient had a new MRI which was reviewed by me today.  It does appear that there is actual progression of the inflammatory changes around the levels of T9-10 and 11.  There is some suggestion of cord signal change also.  The epidural cutaneous masses continuing perhaps are slightly larger.  In light of these changes I have advised the patient that we should proceed with surgical decompression of the lower thoracic spine and evacuation of the pus.  He is agreeable.

## 2022-05-02 NOTE — Anesthesia Procedure Notes (Signed)
Procedure Name: Intubation Date/Time: 05/02/2022 3:45 PM  Performed by: Griffin Dakin, CRNAPre-anesthesia Checklist: Patient identified, Emergency Drugs available, Suction available and Patient being monitored Patient Re-evaluated:Patient Re-evaluated prior to induction Oxygen Delivery Method: Circle system utilized Preoxygenation: Pre-oxygenation with 100% oxygen Induction Type: IV induction Ventilation: Mask ventilation without difficulty Laryngoscope Size: Mac and 4 Grade View: Grade I Tube type: Oral Tube size: 7.5 mm Number of attempts: 1 Airway Equipment and Method: Stylet and Oral airway Placement Confirmation: ETT inserted through vocal cords under direct vision, positive ETCO2 and breath sounds checked- equal and bilateral Secured at: 24 cm Tube secured with: Tape Dental Injury: Teeth and Oropharynx as per pre-operative assessment

## 2022-05-02 NOTE — Transfer of Care (Signed)
Immediate Anesthesia Transfer of Care Note  Patient: John Ware  Procedure(s) Performed: THORACIC  LAMINECTOMY AND DEBRIDEMENT  FOR EPIDURAL ABSCESS (Spine Thoracic)  Patient Location: PACU  Anesthesia Type: General  Level of Consciousness: awake, alert  and patient cooperative  Airway and Oxygen Therapy: Patient Spontanous Breathing and Patient connected to supplemental oxygen  Post-op Assessment: Post-op Vital signs reviewed, Patient's Cardiovascular Status Stable, Respiratory Function Stable, Patent Airway and No signs of Nausea or vomiting  Post-op Vital Signs: Reviewed and stable  Complications: No notable events documented.

## 2022-05-02 NOTE — Op Note (Signed)
Date of surgery: 05/02/2022 Preoperative diagnosis: Osteomyelitis and discitis with epidural abscess formation T9-10 and T10 11-11 12 subcutaneous abscess formation left T12 region Postoperative diagnosis: Same Procedure decompression debridement of subcutaneous abscess decompression and debridement of epidural osteomyelitis via laminectomy T10-T11 Surgeon: [name] Anesthesia: General endotracheal Indications: John Ware is a 56 year old individual who has had significant epidural abscess formation in the past at L1 this was in October 2022 he recovered from that and gradually regained strength in his legs but here in the last couple of months he has developed progressively worsening pain and then some weakness in his lower extremities.  An MRI done 2 weeks ago demonstrated presence of an epidural abscess needle biopsy was performed organism was isolated and he was started on IV antibiotics.  He has been languishing noting that he still has some numbness in his legs his legs do not feel any stronger than and they are not particularly weaker.  Repeat MRI was performed which suggest some progression of disease with restenosis and now some signal change in the end of the cord is advised regarding surgical decompression of this region this is now being performed.  Procedure: Patient was brought to the operating room supine on the stretcher.  After the smooth induction of general endotracheal anesthesia, he was carefully turned prone.  The back was prepped with alcohol DuraPrep and draped in a sterile fashion.  Midline incision was created and carried down to the lumbodorsal fascia at the inferior portion of the incision there was noted to be a mass which corresponded to the subcutaneous collection this was incised and minimal if any fluid was obtained most of the tissue was noted to be thickened scar tissue, fascial thickening in this region this was removed partially using a Leksell rongeur and specimens  were sent from this region for culture.  Once this area was decompressed a laminar compression was then planned and a subperiosteal dissection was obtained at the T10 and T11-T12 regions self-retaining retractor was placed in the wound laminectomy was then created removing the spinous process and lamina of T11 and T10 and ultimately laminectomy was performed by using high-speed drill to drill down the center arch of the lamina and a 2 mm and 3 mm Kerrison punch to be used elevate the lamina when we are in the epidural space no gross pus was encountered there was some thickened and edematous epidural tissue and this was resected and also sent for specimen in addition the to the laminar bone and facet capsular material at T10-11 and T11-12.  In the good decompression was obtained of the dura on the dorsal lateral aspects.  Hemostasis was achieved using some Surgifoam and cautery very carefully in the epidural space.  A large Hemovac drain was then placed and brought out through a separate stab incision and the thoracodorsal fascia was then closed with #1 Vicryl in interrupted fashion 2-0 Vicryl was used in the subcutaneous tissues 3-0 Vicryl subcuticularly and the wound was then dressed with a dry sterile dressing and gauze.  Blood loss for the procedure was estimated at 100 cc.

## 2022-05-02 NOTE — Anesthesia Postprocedure Evaluation (Signed)
Anesthesia Post Note  Patient: John Ware  Procedure(s) Performed: THORACIC  LAMINECTOMY AND DEBRIDEMENT  FOR EPIDURAL ABSCESS (Spine Thoracic)     Patient location during evaluation: PACU Anesthesia Type: General Level of consciousness: awake and alert Pain management: pain level controlled Vital Signs Assessment: post-procedure vital signs reviewed and stable Respiratory status: spontaneous breathing, nonlabored ventilation, respiratory function stable and patient connected to nasal cannula oxygen Cardiovascular status: blood pressure returned to baseline and stable Postop Assessment: no apparent nausea or vomiting Anesthetic complications: no   No notable events documented.  Last Vitals:  Vitals:   05/02/22 1820 05/02/22 1842  BP: (!) 143/101 (!) 144/96  Pulse: 76 74  Resp: 13 (!) 22  Temp:  36.6 C  SpO2: 98% 100%    Last Pain:  Vitals:   05/02/22 1842  TempSrc: Oral  PainSc: 10-Worst pain ever                 Trevor Iha

## 2022-05-02 NOTE — Progress Notes (Signed)
Subjective:  Patient says he is going for surgery tonight  Antibiotics:  Anti-infectives (From admission, onward)    Start     Dose/Rate Route Frequency Ordered Stop   05/01/22 2200  doxycycline (VIBRA-TABS) tablet 100 mg        100 mg Oral Every 12 hours 05/01/22 1346     04/28/22 1200  vancomycin (VANCOREADY) IVPB 750 mg/150 mL  Status:  Discontinued        750 mg 150 mL/hr over 60 Minutes Intravenous Every 8 hours 04/28/22 0238 05/01/22 1346   04/26/22 0200  vancomycin (VANCOREADY) IVPB 1250 mg/250 mL  Status:  Discontinued        1,250 mg 166.7 mL/hr over 90 Minutes Intravenous Every 12 hours 04/25/22 1250 04/28/22 0236   04/25/22 1400  ceFEPIme (MAXIPIME) 2 g in sodium chloride 0.9 % 100 mL IVPB        2 g 200 mL/hr over 30 Minutes Intravenous Every 8 hours 04/25/22 1236     04/25/22 1345  vancomycin (VANCOREADY) IVPB 1500 mg/300 mL        1,500 mg 150 mL/hr over 120 Minutes Intravenous  Once 04/25/22 1250 04/25/22 1558   04/24/22 0400  vancomycin (VANCOREADY) IVPB 1250 mg/250 mL  Status:  Discontinued        1,250 mg 166.7 mL/hr over 90 Minutes Intravenous Every 12 hours 04/23/22 1416 04/24/22 0913   04/23/22 1400  vancomycin (VANCOREADY) IVPB 1500 mg/300 mL        1,500 mg 150 mL/hr over 120 Minutes Intravenous  Once 04/23/22 1349 04/23/22 1831   04/22/22 2200  ceFAZolin (ANCEF) IVPB 2g/100 mL premix  Status:  Discontinued        2 g 200 mL/hr over 30 Minutes Intravenous Every 8 hours 04/22/22 2157 04/23/22 1019       Medications: Scheduled Meds:  baclofen  5 mg Oral TID   doxycycline  100 mg Oral Q12H   gabapentin  100 mg Oral BID   insulin aspart  0-6 Units Subcutaneous TID WC   morphine  45 mg Oral Q12H   senna-docusate  1 tablet Oral BID   Continuous Infusions:  sodium chloride Stopped (04/23/22 1631)   ceFEPime (MAXIPIME) IV 2 g (05/02/22 0709)   PRN Meds:.sodium chloride, acetaminophen **OR** acetaminophen, hydrOXYzine, morphine injection,  naLOXone (NARCAN)  injection, ondansetron (ZOFRAN) IV, oxyCODONE-acetaminophen, polyethylene glycol    Objective: Weight change:   Intake/Output Summary (Last 24 hours) at 05/02/2022 1142 Last data filed at 05/01/2022 1700 Gross per 24 hour  Intake 350 ml  Output 700 ml  Net -350 ml    Blood pressure (!) 118/96, pulse 98, temperature 98.4 F (36.9 C), temperature source Oral, resp. rate 18, height 5\' 10"  (1.778 m), weight 74.2 kg, SpO2 100 %. Temp:  [98 F (36.7 C)-98.7 F (37.1 C)] 98.4 F (36.9 C) (09/07 1136) Pulse Rate:  [55-98] 98 (09/07 1136) Resp:  [18-20] 18 (09/07 1136) BP: (106-132)/(70-96) 118/96 (09/07 1136) SpO2:  [98 %-100 %] 100 % (09/07 1136) Weight:  [74.2 kg] 74.2 kg (09/07 0500)  Physical Exam: Physical Exam Constitutional:      Appearance: He is well-developed.  HENT:     Head: Normocephalic and atraumatic.  Eyes:     Conjunctiva/sclera: Conjunctivae normal.  Cardiovascular:     Rate and Rhythm: Normal rate and regular rhythm.  Pulmonary:     Effort: Pulmonary effort is normal. No respiratory distress.     Breath sounds:  Normal breath sounds. No stridor. No wheezing.  Abdominal:     General: There is no distension.     Palpations: Abdomen is soft.  Musculoskeletal:        General: Normal range of motion.     Cervical back: Normal range of motion and neck supple.  Skin:    General: Skin is warm and dry.     Findings: No erythema or rash.  Neurological:     General: No focal deficit present.     Mental Status: He is alert and oriented to person, place, and time.  Psychiatric:        Mood and Affect: Mood normal.        Behavior: Behavior normal.        Thought Content: Thought content normal.        Judgment: Judgment normal.      CBC:    BMET Recent Labs    04/30/22 0420  NA 136  K 4.2  CL 103  CO2 24  GLUCOSE 117*  BUN 10  CREATININE 0.64  CALCIUM 9.2      Liver Panel  No results for input(s): "PROT", "ALBUMIN", "AST",  "ALT", "ALKPHOS", "BILITOT", "BILIDIR", "IBILI" in the last 72 hours.      Sedimentation Rate No results for input(s): "ESRSEDRATE" in the last 72 hours. C-Reactive Protein No results for input(s): "CRP" in the last 72 hours.  Micro Results: Recent Results (from the past 720 hour(s))  Blood culture (routine x 2)     Status: None   Collection Time: 04/22/22  2:52 PM   Specimen: BLOOD  Result Value Ref Range Status   Specimen Description BLOOD SITE NOT SPECIFIED  Final   Special Requests   Final    BOTTLES DRAWN AEROBIC AND ANAEROBIC Blood Culture adequate volume   Culture   Final    NO GROWTH 5 DAYS Performed at [facility], [address] [place], [place] [address]    Report Status 04/27/2022 FINAL  Final  Blood culture (routine x 2)     Status: Abnormal   Collection Time: 04/22/22  7:24 PM   Specimen: BLOOD  Result Value Ref Range Status   Specimen Description BLOOD LEFT ANTECUBITAL  Final   Special Requests   Final    BOTTLES DRAWN AEROBIC AND ANAEROBIC Blood Culture results may not be optimal due to an excessive volume of blood received in culture bottles   Culture  Setup Time   Final    GRAM POSITIVE COCCI IN CLUSTERS IN BOTH AEROBIC AND ANAEROBIC BOTTLES CRITICAL RESULT CALLED TO, READ BACK BY AND VERIFIED WITH: PHARMD LISA CURRAN ON 04/23/22 @ 1951 BY DRT    Culture (A)  Final    STAPHYLOCOCCUS PSEUDINTERMEDIUS STAPHYLOCOCCUS EPIDERMIDIS THE SIGNIFICANCE OF ISOLATING THIS ORGANISM FROM A SINGLE SET OF BLOOD CULTURES WHEN MULTIPLE SETS ARE DRAWN IS UNCERTAIN. PLEASE NOTIFY THE MICROBIOLOGY DEPARTMENT WITHIN ONE WEEK IF SPECIATION AND SENSITIVITIES ARE REQUIRED. Performed at [facility], [address], [place], [place] [address]    Report Status 04/25/2022 FINAL  Final  Blood Culture ID Panel (Reflexed)     Status: Abnormal   Collection Time: 04/22/22  7:24 PM  Result Value Ref Range Status   Enterococcus faecalis NOT DETECTED NOT DETECTED Final    Enterococcus Faecium NOT DETECTED NOT DETECTED Final   Listeria monocytogenes NOT DETECTED NOT DETECTED Final   Staphylococcus species DETECTED (A) NOT DETECTED Final    Comment: CRITICAL RESULT CALLED  TO, READ BACK BY AND VERIFIED WITH: PHARMD LISA CURRAN ON 04/23/22 @ 1951 BY DRT    Staphylococcus aureus (BCID) NOT DETECTED NOT DETECTED Final   Staphylococcus epidermidis DETECTED (A) NOT DETECTED Final    Comment: Methicillin (oxacillin) resistant coagulase negative staphylococcus. Possible blood culture contaminant (unless isolated from more than one blood culture draw or clinical case suggests pathogenicity). No antibiotic treatment is indicated for blood  culture contaminants. CRITICAL RESULT CALLED TO, READ BACK BY AND VERIFIED WITH: PHARMD LISA CURRAN ON 04/23/22 @ 1951 BY DRT    Staphylococcus lugdunensis NOT DETECTED NOT DETECTED Final   Streptococcus species NOT DETECTED NOT DETECTED Final   Streptococcus agalactiae NOT DETECTED NOT DETECTED Final   Streptococcus pneumoniae NOT DETECTED NOT DETECTED Final   Streptococcus pyogenes NOT DETECTED NOT DETECTED Final   A.calcoaceticus-baumannii NOT DETECTED NOT DETECTED Final   Bacteroides fragilis NOT DETECTED NOT DETECTED Final   Enterobacterales NOT DETECTED NOT DETECTED Final   Enterobacter cloacae complex NOT DETECTED NOT DETECTED Final   Escherichia coli NOT DETECTED NOT DETECTED Final   Klebsiella aerogenes NOT DETECTED NOT DETECTED Final   Klebsiella oxytoca NOT DETECTED NOT DETECTED Final   Klebsiella pneumoniae NOT DETECTED NOT DETECTED Final   Proteus species NOT DETECTED NOT DETECTED Final   Salmonella species NOT DETECTED NOT DETECTED Final   Serratia marcescens NOT DETECTED NOT DETECTED Final   Haemophilus influenzae NOT DETECTED NOT DETECTED Final   Neisseria meningitidis NOT DETECTED NOT DETECTED Final   Pseudomonas aeruginosa NOT DETECTED NOT DETECTED Final   Stenotrophomonas maltophilia NOT DETECTED NOT DETECTED  Final   Candida albicans NOT DETECTED NOT DETECTED Final   Candida auris NOT DETECTED NOT DETECTED Final   Candida glabrata NOT DETECTED NOT DETECTED Final   Candida krusei NOT DETECTED NOT DETECTED Final   Candida parapsilosis NOT DETECTED NOT DETECTED Final   Candida tropicalis NOT DETECTED NOT DETECTED Final   Cryptococcus neoformans/gattii NOT DETECTED NOT DETECTED Final   Methicillin resistance mecA/C DETECTED (A) NOT DETECTED Final    Comment: CRITICAL RESULT CALLED TO, READ BACK BY AND VERIFIED WITH: PHARMD LISA CURRAN ON 04/23/22 @ 1951 BY DRT Performed at [facility], [address] [place], [place] [address]   Body fluid culture w Gram Stain     Status: Abnormal   Collection Time: 04/24/22 11:37 AM   Specimen: PATH Disc; Body Fluid  Result Value Ref Range Status   Specimen Description FLUID  Final   Special Requests  DISC  Final   Gram Stain   Final    RARE WBC PRESENT, PREDOMINANTLY PMN RARE GRAM VARIABLE ROD Performed at [facility], [address] [place], [place] [address]    Culture PSEUDOMONAS AERUGINOSA (A)  Final   Report Status 04/26/2022 FINAL  Final   Organism ID, Bacteria PSEUDOMONAS AERUGINOSA  Final      Susceptibility   Pseudomonas aeruginosa - MIC*    CEFTAZIDIME 4 SENSITIVE Sensitive     CIPROFLOXACIN <=0.25 SENSITIVE Sensitive     GENTAMICIN <=1 SENSITIVE Sensitive     IMIPENEM 1 SENSITIVE Sensitive     PIP/TAZO 8 SENSITIVE Sensitive     CEFEPIME 2 SENSITIVE Sensitive     * PSEUDOMONAS AERUGINOSA  Aerobic/Anaerobic Culture w Gram Stain (surgical/deep wound)     Status: None   Collection Time: 04/25/22 12:10 PM   Specimen: Abscess  Result Value Ref Range Status   Specimen Description ABSCESS  Final   Special Requests NONE  Final   Gram Stain   Final    FEW WBC PRESENT, PREDOMINANTLY PMN NO ORGANISMS SEEN    Culture   Final    FEW PSEUDOMONAS AERUGINOSA NO ANAEROBES ISOLATED Performed at [facility], [address], [place], [place] [address]    Report Status 04/30/2022 FINAL  Final   Organism ID, Bacteria PSEUDOMONAS AERUGINOSA  Final      Susceptibility   Pseudomonas aeruginosa - MIC*    CEFTAZIDIME 4 SENSITIVE Sensitive     CIPROFLOXACIN <=0.25 SENSITIVE Sensitive     GENTAMICIN <=1 SENSITIVE Sensitive     IMIPENEM 1 SENSITIVE Sensitive     PIP/TAZO 8 SENSITIVE Sensitive     CEFEPIME 2 SENSITIVE Sensitive     * FEW PSEUDOMONAS AERUGINOSA    Studies/Results: MR THORACIC SPINE W WO CONTRAST  Result Date: 05/01/2022 CLINICAL DATA:  Osteomyelitis EXAM: MRI THORACIC WITHOUT AND WITH CONTRAST TECHNIQUE: Multiplanar and multiecho pulse sequences of the thoracic spine were obtained without and with intravenous contrast. CONTRAST:  7.31mL GADAVIST GADOBUTROL 1 MMOL/ML IV SOLN COMPARISON:  Thoracic spine MRI 04/23/2022 FINDINGS: Alignment:  Normal. Vertebrae: There is STIR signal abnormality and enhancement in the T9 through T12 vertebral bodies, worst at T10 and T11, increased at T9 and T12 since 04/23/2022 there is fluid signal and enhancement in the intervening T10-T11 disc space. The other disc spaces are spared. There is abnormal enhancement about the left T10-T11 facet joint which remains concerning for septic arthritis (21-15, 24-41). There is epidural phlegmon extending along the posterior endplates at T9 through T12 measuring up to proximally 4 mm in maximal thickness with small foci of non enhancement (24-36, 24-38), which remains consistent with epidural phlegmon/abscess. This again results in severe spinal canal stenosis at T10-T11 and T11-T12 with cord compression and now with possible cord signal abnormality (18-35) which is slightly increased in conspicuity since the prior study. Enhancing tissue in the paraspinal soft tissues from T9 through T12 likely also reflecting phlegmon is not significantly changed. Sequela of prior discitis/osteomyelitis at T3-T4 is unchanged with unchanged obliteration of the  intervening disc space. Marrow signal at the other levels is within normal limits. Cord: Possible cord signal abnormality at T10-T11 as above. The cord above this level is normal signal and morphology. Paraspinal and other soft tissues: Again seen is a fluid collection in the posterior soft tissues to the left of midline at the T11-T12 level measuring 4.2 cm TV x 1.1 cm AP by 3.3 cm cc, not significantly changed. There is no convincing peripheral enhancement. Diffuse enhancement in the posterior soft tissues throughout the midthoracic spine is stable to slightly decreased. Abnormal enhancement the paraspinal soft tissues from T9 through T12 is not significantly changed, as above. There are small left and trace right pleural effusions, not significantly changed. Disc levels: Scattered disc protrusions are again seen, most notable at T4-T5, T5-T6, and T8-T9 resulting in mild spinal canal stenosis with flattening of the ventral cord surface. Multilevel facet arthropathy with neural foraminal stenosis most advanced at T10-T11 and T11-T12 is similar to the prior study. IMPRESSION: 1. Findings consistent with persistent osteomyelitis at T9 through T12, with signal abnormality having worsened at T9 and T12 since 04/23/2022. 2. Findings remain concerning for left septic arthritis at T11-T12. 3. 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 4.  Unchanged enhancement of the paraspinal soft tissues at T9 through T12 without evidence of paraspinal abscess. 5. Unchanged fluid collection in the left posterior paraspinal soft tissues at T11-T12. 6. Stable to slightly decreased diffuse enhancement in the posterior soft tissues extending from T2 through T7. 7. Unchanged small left and trace right pleural effusions. Electronically  Signed   By: Lesia Hausen M.D.   On: 05/01/2022 13:45      Assessment/Plan:  INTERVAL HISTORY:   Repeat MRI was performed see below  Principal Problem:   Osteomyelitis of vertebra of thoracolumbar region [facility]) Active Problems:   DM2 (diabetes mellitus, type 2) (HCC)   Discitis of thoracolumbar region   Chronic diastolic CHF (congestive heart failure) (HCC)   Chronic pain    John Ware is a 56 y.o. 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   During that admission he was found to have MRSA bacteremia.   He also had worsening discitis and osteomyelitis at L1-2 and L2-3 with stenosis of L1 and L2 with progressive weakness.   He was taken the operating room by [name] on June 05, 2021 and underwent lumbar laminotomies decompression L1-L2 and L2-L3 on the left with decompression dural tube and nerve roots L1-L2 and 3.    He was initially treated with daptomycin, and then changed to vancomycin and rifampin ultimately transition to chronic indefinite doxycycline therapy  UNfortunately he is now readmitted with extensive infection in the thoracic spine with discitis and osteomyelitis that are acute as well as an epidural phlegmon abscess as well as soft tissue abscess.  IR guided aspirate  has yielded pseudomonas  #1 Acute osteomyelitis discitis with epidural abscess   MRI repeated yesterday showed persistent osteomyelitis T9- to T12 which is worsening with  septic arthritis T11-T12 persistent epidural abscess extending from T9-T12 with severe spinal canal stenosis at T10-T11 T11-T12 cord abnormality at T10-T11 paraspinal soft tissues of T9-T12 and abscess in the left posterior soft tissue  He is going for Neurosurgery tonight he tells me  Would DEFINITELY GET NEW CULTURES IN the OR  We are currently using cefepime IV and doxycycline po (since his MRSA, MSSA have not been isolated on culture at this time it seems like pseudomonas is playing more of a role in his acute pathology   #2  Tissue infection This will also have an I&D  #3 Staphylococcus epidermidis  and pseudointermedius species 1 of 2 admission blood cultures.  =  contaminant.  I spent  54  minutes with the patient including than 50% of the time in face to face counseling of the patient re his severe spinal infection personally reviewing MRI of T spine along with review of medical records in preparation for the visit and during the visit and in coordination of his care.    .  LOS: 10 days   John Ware 05/02/2022, 11:42 AM

## 2022-05-02 NOTE — Anesthesia Preprocedure Evaluation (Addendum)
Anesthesia Evaluation  Patient identified by MRN, date of birth, ID band Patient awake    Reviewed: Allergy & Precautions, NPO status , Patient's Chart, lab work & pertinent test results  Airway Mallampati: II  TM Distance: >3 FB Neck ROM: Full    Dental no notable dental hx. (+) Chipped, Partial Upper, Missing, Loose,    Pulmonary former smoker,    Pulmonary exam normal breath sounds clear to auscultation       Cardiovascular hypertension, Normal cardiovascular exam Rhythm:Regular Rate:Normal  05/2021 TTE Left Ventricle: Left ventricular ejection fraction, by estimation, is 60  to 65%. The left ventricle has normal function. The left ventricle has no  regional wall motion abnormalities. The left ventricular internal cavity  size was normal in size. There is  mild left ventricular hypertrophy. Left ventricular diastolic parameters  are consistent with Grade I diastolic dysfunction (impaired relaxation).    Neuro/Psych T9-T12 epidural abcess    GI/Hepatic   Endo/Other  diabetes, Type 2  Renal/GU      Musculoskeletal  (+) Arthritis ,   Abdominal   Peds  Hematology  (+) Blood dyscrasia, anemia , Lab Results      Component                Value               Date                      WBC                      11.5 (H)            04/28/2022                HGB                      9.7 (L)             04/28/2022                HCT                      30.4 (L)            04/28/2022                MCV                      79.0 (L)            04/28/2022                PLT                      526 (H)             04/28/2022              Anesthesia Other Findings ALL: Tizidine  Reproductive/Obstetrics                            Anesthesia Physical Anesthesia Plan  ASA: 3  Anesthesia Plan: General   Post-op Pain Management: Ketamine IV*, Ofirmev IV (intra-op)* and Dilaudid IV   Induction:  Intravenous  PONV Risk Score and Plan: Treatment may vary due to age or medical condition, Ondansetron, Midazolam and Dexamethasone  Airway Management Planned: Oral ETT  Additional Equipment: Arterial line  Intra-op Plan:   Post-operative Plan: Extubation in OR  Informed Consent: I have reviewed the patients History and Physical, chart, labs and discussed the procedure including the risks, benefits and alternatives for the proposed anesthesia with the patient or authorized representative who has indicated his/her understanding and acceptance.     Dental advisory given  Plan Discussed with:   Anesthesia Plan Comments:         Anesthesia Quick Evaluation

## 2022-05-03 ENCOUNTER — Encounter (HOSPITAL_COMMUNITY): Payer: Self-pay | Admitting: Neurological Surgery

## 2022-05-03 ENCOUNTER — Other Ambulatory Visit: Payer: Self-pay | Admitting: Family

## 2022-05-03 ENCOUNTER — Inpatient Hospital Stay: Payer: Self-pay

## 2022-05-03 DIAGNOSIS — M4645 Discitis, unspecified, thoracolumbar region: Secondary | ICD-10-CM | POA: Diagnosis not present

## 2022-05-03 DIAGNOSIS — M4625 Osteomyelitis of vertebra, thoracolumbar region: Secondary | ICD-10-CM | POA: Diagnosis not present

## 2022-05-03 DIAGNOSIS — I5032 Chronic diastolic (congestive) heart failure: Secondary | ICD-10-CM | POA: Diagnosis not present

## 2022-05-03 DIAGNOSIS — G8929 Other chronic pain: Secondary | ICD-10-CM | POA: Diagnosis not present

## 2022-05-03 DIAGNOSIS — E1169 Type 2 diabetes mellitus with other specified complication: Secondary | ICD-10-CM

## 2022-05-03 DIAGNOSIS — M869 Osteomyelitis, unspecified: Secondary | ICD-10-CM | POA: Diagnosis not present

## 2022-05-03 LAB — GLUCOSE, CAPILLARY
Glucose-Capillary: 239 mg/dL — ABNORMAL HIGH (ref 70–99)
Glucose-Capillary: 121 mg/dL — ABNORMAL HIGH (ref 70–99)

## 2022-05-03 LAB — HEMOGLOBIN A1C
Hgb A1c MFr Bld: 7.1 % — ABNORMAL HIGH (ref 4.8–5.6)
Mean Plasma Glucose: 157.07 mg/dL

## 2022-05-03 MED ORDER — INSULIN DETEMIR 100 UNIT/ML ~~LOC~~ SOLN
5.0000 [IU] | Freq: Two times a day (BID) | SUBCUTANEOUS | Status: DC
  Filled 2022-05-03 (×12): qty 0.05

## 2022-05-03 MED ORDER — INSULIN ASPART 100 UNIT/ML IJ SOLN: 0.0000 [IU] | Freq: Every day | INTRAMUSCULAR | Status: DC

## 2022-05-03 MED ORDER — INSULIN DETEMIR 100 UNIT/ML ~~LOC~~ SOLN: 10.0000 [IU] | Freq: Two times a day (BID) | SUBCUTANEOUS | Status: DC

## 2022-05-03 MED ORDER — INSULIN ASPART 100 UNIT/ML IJ SOLN: 0.0000 [IU] | Freq: Three times a day (TID) | INTRAMUSCULAR | Status: DC

## 2022-05-03 MED ORDER — GABAPENTIN 100 MG PO CAPS
200.0000 mg | ORAL_CAPSULE | Freq: Two times a day (BID) | ORAL | Status: DC
  Administered 2022-05-03 – 2022-05-05 (×4): 200 mg via ORAL
  Filled 2022-05-03 (×4): qty 2

## 2022-05-03 MED FILL — Thrombin For Soln 5000 Unit: CUTANEOUS | Qty: 2 | Status: AC

## 2022-05-03 NOTE — Progress Notes (Signed)
Subjective:  He is again complaining of inadequate pain control  Antibiotics:  Anti-infectives (From admission, onward)    Start     Dose/Rate Route Frequency Ordered Stop   05/01/22 2200  doxycycline (VIBRA-TABS) tablet 100 mg        100 mg Oral Every 12 hours 05/01/22 1346     04/28/22 1200  vancomycin (VANCOREADY) IVPB 750 mg/150 mL  Status:  Discontinued        750 mg 150 mL/hr over 60 Minutes Intravenous Every 8 hours 04/28/22 0238 05/01/22 1346   04/26/22 0200  vancomycin (VANCOREADY) IVPB 1250 mg/250 mL  Status:  Discontinued        1,250 mg 166.7 mL/hr over 90 Minutes Intravenous Every 12 hours 04/25/22 1250 04/28/22 0236   04/25/22 1400  ceFEPIme (MAXIPIME) 2 g in sodium chloride 0.9 % 100 mL IVPB        2 g 200 mL/hr over 30 Minutes Intravenous Every 8 hours 04/25/22 1236     04/25/22 1345  vancomycin (VANCOREADY) IVPB 1500 mg/300 mL        1,500 mg 150 mL/hr over 120 Minutes Intravenous  Once 04/25/22 1250 04/25/22 1558   04/24/22 0400  vancomycin (VANCOREADY) IVPB 1250 mg/250 mL  Status:  Discontinued        1,250 mg 166.7 mL/hr over 90 Minutes Intravenous Every 12 hours 04/23/22 1416 04/24/22 0913   04/23/22 1400  vancomycin (VANCOREADY) IVPB 1500 mg/300 mL        1,500 mg 150 mL/hr over 120 Minutes Intravenous  Once 04/23/22 1349 04/23/22 1831   04/22/22 2200  ceFAZolin (ANCEF) IVPB 2g/100 mL premix  Status:  Discontinued        2 g 200 mL/hr over 30 Minutes Intravenous Every 8 hours 04/22/22 2157 04/23/22 1019       Medications: Scheduled Meds:  baclofen  5 mg Oral TID   doxycycline  100 mg Oral Q12H   gabapentin  100 mg Oral BID   insulin aspart  0-6 Units Subcutaneous TID WC   morphine  45 mg Oral Q12H   senna-docusate  1 tablet Oral BID   sodium chloride flush  3 mL Intravenous Q12H   Continuous Infusions:  sodium chloride Stopped (04/23/22 1631)   sodium chloride     ceFEPime (MAXIPIME) IV 2 g (05/03/22 0509)   PRN Meds:.sodium  chloride, acetaminophen **OR** acetaminophen, alum & mag hydroxide-simeth, bisacodyl, hydrOXYzine, menthol-cetylpyridinium **OR** phenol, morphine injection, naLOXone (NARCAN)  injection, ondansetron **OR** ondansetron (ZOFRAN) IV, oxyCODONE-acetaminophen, polyethylene glycol, sodium chloride flush, sodium phosphate    Objective: Weight change:   Intake/Output Summary (Last 24 hours) at 05/03/2022 1129 Last data filed at 05/03/2022 1657 Gross per 24 hour  Intake 1400 ml  Output 1805 ml  Net -405 ml    Blood pressure (!) 116/97, pulse 96, temperature 98.3 F (36.8 C), resp. rate 16, height _0  (1.778 m), weight 74.2 kg, SpO2 97 %. Temp:  [97.3 F (36.3 C)-98.4 F (36.9 C)] 98.3 F (36.8 C) (09/08 0818) Pulse Rate:  [71-105] 96 (09/08 0818) Resp:  [10-22] 16 (09/08 0818) BP: (110-156)/(76-101) 116/97 (09/08 0818) SpO2:  [95 %-100 %] 97 % (09/08 0818) Arterial Line BP: (181)/(84) 181/84 (09/07 1750)  Physical Exam: Physical Exam Constitutional:      Appearance: He is well-developed.  HENT:     Head: Normocephalic and atraumatic.  Eyes:     Conjunctiva/sclera: Conjunctivae normal.  Cardiovascular:     Rate  and Rhythm: Normal rate and regular rhythm.  Pulmonary:     Effort: Pulmonary effort is normal. No respiratory distress.     Breath sounds: No wheezing.  Abdominal:     General: There is no distension.     Palpations: Abdomen is soft.  Musculoskeletal:     Cervical back: Normal range of motion and neck supple.  Skin:    General: Skin is warm and dry.     Findings: No erythema or rash.  Neurological:     Mental Status: He is alert and oriented to person, place, and time.  Psychiatric:        Mood and Affect: Mood is depressed.        Speech: Speech normal.        Behavior: Behavior normal.        Thought Content: Thought content normal.        Cognition and Memory: Cognition and memory normal.        Judgment: Judgment normal.      CBC:    BMET No results  for input(s): "NA", "K", "CL", "CO2", "GLUCOSE", "BUN", "CREATININE", "CALCIUM" in the last 72 hours.   Liver Panel  No results for input(s): "PROT", "ALBUMIN", "AST", "ALT", "ALKPHOS", "BILITOT", "BILIDIR", "IBILI" in the last 72 hours.      Sedimentation Rate No results for input(s): "ESRSEDRATE" in the last 72 hours. C-Reactive Protein No results for input(s): "CRP" in the last 72 hours.  Micro Results: Recent Results (from the past 720 hour(s))  Blood culture (routine x 2)     Status: None   Collection Time: 04/22/22  2:52 PM   Specimen: BLOOD  Result Value Ref Range Status   Specimen Description BLOOD SITE NOT SPECIFIED  Final   Special Requests   Final    BOTTLES DRAWN AEROBIC AND ANAEROBIC Blood Culture adequate volume   Culture   Final    NO GROWTH 5 DAYS Performed at [facility], [address] [place], [place] [address]    Report Status 04/27/2022 FINAL  Final  Blood culture (routine x 2)     Status: Abnormal   Collection Time: 04/22/22  7:24 PM   Specimen: BLOOD  Result Value Ref Range Status   Specimen Description BLOOD LEFT ANTECUBITAL  Final   Special Requests   Final    BOTTLES DRAWN AEROBIC AND ANAEROBIC Blood Culture results may not be optimal due to an excessive volume of blood received in culture bottles   Culture  Setup Time   Final    GRAM POSITIVE COCCI IN CLUSTERS IN BOTH AEROBIC AND ANAEROBIC BOTTLES CRITICAL RESULT CALLED TO, READ BACK BY AND VERIFIED WITH: PHARMD LISA [place] ON 04/23/22 @ 1951 BY DRT    Culture (A)  Final    STAPHYLOCOCCUS PSEUDINTERMEDIUS STAPHYLOCOCCUS EPIDERMIDIS THE SIGNIFICANCE OF ISOLATING THIS ORGANISM FROM A SINGLE SET OF BLOOD CULTURES WHEN MULTIPLE SETS ARE DRAWN IS UNCERTAIN. PLEASE NOTIFY THE MICROBIOLOGY DEPARTMENT WITHIN ONE WEEK IF SPECIATION AND SENSITIVITIES ARE REQUIRED. Performed at [facility], [place] [address], [place], [place] [address]    Report Status 04/25/2022 FINAL  Final  Blood Culture  ID Panel (Reflexed)     Status: Abnormal   Collection Time: 04/22/22  7:24 PM  Result Value Ref Range Status   Enterococcus faecalis NOT DETECTED NOT DETECTED Final   Enterococcus Faecium NOT DETECTED NOT DETECTED Final   Listeria monocytogenes NOT DETECTED NOT DETECTED Final   Staphylococcus species DETECTED (A) NOT  DETECTED Final    Comment: CRITICAL RESULT CALLED TO, READ BACK BY AND VERIFIED WITH: PHARMD LISA CURRAN ON 04/23/22 @ 1951 BY DRT    Staphylococcus aureus (BCID) NOT DETECTED NOT DETECTED Final   Staphylococcus epidermidis DETECTED (A) NOT DETECTED Final    Comment: Methicillin (oxacillin) resistant coagulase negative staphylococcus. Possible blood culture contaminant (unless isolated from more than one blood culture draw or clinical case suggests pathogenicity). No antibiotic treatment is indicated for blood  culture contaminants. CRITICAL RESULT CALLED TO, READ BACK BY AND VERIFIED WITH: PHARMD LISA CURRAN ON 04/23/22 @ 1951 BY DRT    Staphylococcus lugdunensis NOT DETECTED NOT DETECTED Final   Streptococcus species NOT DETECTED NOT DETECTED Final   Streptococcus agalactiae NOT DETECTED NOT DETECTED Final   Streptococcus pneumoniae NOT DETECTED NOT DETECTED Final   Streptococcus pyogenes NOT DETECTED NOT DETECTED Final   A.calcoaceticus-baumannii NOT DETECTED NOT DETECTED Final   Bacteroides fragilis NOT DETECTED NOT DETECTED Final   Enterobacterales NOT DETECTED NOT DETECTED Final   Enterobacter cloacae complex NOT DETECTED NOT DETECTED Final   Escherichia coli NOT DETECTED NOT DETECTED Final   Klebsiella aerogenes NOT DETECTED NOT DETECTED Final   Klebsiella oxytoca NOT DETECTED NOT DETECTED Final   Klebsiella pneumoniae NOT DETECTED NOT DETECTED Final   Proteus species NOT DETECTED NOT DETECTED Final   Salmonella species NOT DETECTED NOT DETECTED Final   Serratia marcescens NOT DETECTED NOT DETECTED Final   Haemophilus influenzae NOT DETECTED NOT DETECTED Final    Neisseria meningitidis NOT DETECTED NOT DETECTED Final   Pseudomonas aeruginosa NOT DETECTED NOT DETECTED Final   Stenotrophomonas maltophilia NOT DETECTED NOT DETECTED Final   Candida albicans NOT DETECTED NOT DETECTED Final   Candida auris NOT DETECTED NOT DETECTED Final   Candida glabrata NOT DETECTED NOT DETECTED Final   Candida krusei NOT DETECTED NOT DETECTED Final   Candida parapsilosis NOT DETECTED NOT DETECTED Final   Candida tropicalis NOT DETECTED NOT DETECTED Final   Cryptococcus neoformans/gattii NOT DETECTED NOT DETECTED Final   Methicillin resistance mecA/C DETECTED (A) NOT DETECTED Final    Comment: CRITICAL RESULT CALLED TO, READ BACK BY AND VERIFIED WITH: PHARMD LISA CURRAN ON 04/23/22 @ 1951 BY DRT Performed at [facility], [address], [place], [place] [address]   Body fluid culture w Gram Stain     Status: Abnormal   Collection Time: 04/24/22 11:37 AM   Specimen: PATH Disc; Body Fluid  Result Value Ref Range Status   Specimen Description FLUID  Final   Special Requests  DISC  Final   Gram Stain   Final    RARE WBC PRESENT, PREDOMINANTLY PMN RARE GRAM VARIABLE ROD Performed at [facility] [address] [place], [place] [address]    Culture PSEUDOMONAS AERUGINOSA (A)  Final   Report Status 04/26/2022 FINAL  Final   Organism ID, Bacteria PSEUDOMONAS AERUGINOSA  Final      Susceptibility   Pseudomonas aeruginosa - MIC*    CEFTAZIDIME 4 SENSITIVE Sensitive     CIPROFLOXACIN <=0.25 SENSITIVE Sensitive     GENTAMICIN <=1 SENSITIVE Sensitive     IMIPENEM 1 SENSITIVE Sensitive     PIP/TAZO 8 SENSITIVE Sensitive     CEFEPIME 2 SENSITIVE Sensitive     * PSEUDOMONAS AERUGINOSA  Aerobic/Anaerobic Culture w Gram Stain (surgical/deep wound)     Status: None   Collection Time: 04/25/22 12:10 PM   Specimen: Abscess  Result Value Ref Range Status   Specimen Description  ABSCESS  Final   Special Requests NONE  Final   Gram Stain   Final    FEW WBC  PRESENT, PREDOMINANTLY PMN NO ORGANISMS SEEN    Culture   Final    FEW PSEUDOMONAS AERUGINOSA NO ANAEROBES ISOLATED Performed at [facility], [place] [address] [place], [place] [address]    Report Status 04/30/2022 FINAL  Final   Organism ID, Bacteria PSEUDOMONAS AERUGINOSA  Final      Susceptibility   Pseudomonas aeruginosa - MIC*    CEFTAZIDIME 4 SENSITIVE Sensitive     CIPROFLOXACIN <=0.25 SENSITIVE Sensitive     GENTAMICIN <=1 SENSITIVE Sensitive     IMIPENEM 1 SENSITIVE Sensitive     PIP/TAZO 8 SENSITIVE Sensitive     CEFEPIME 2 SENSITIVE Sensitive     * FEW PSEUDOMONAS AERUGINOSA  Surgical pcr screen     Status: None   Collection Time: 05/02/22 10:33 AM   Specimen: Nasal Mucosa; Nasal Swab  Result Value Ref Range Status   MRSA, PCR NEGATIVE NEGATIVE Final   Staphylococcus aureus NEGATIVE NEGATIVE Final    Comment: (NOTE) The Xpert SA Assay (FDA approved for NASAL specimens in patients 65 years of age and older), is one component of a comprehensive surveillance program. It is not intended to diagnose infection nor to guide or monitor treatment. Performed at [facility], [place] [address], [place], [place] [address]   Aerobic/Anaerobic Culture w Gram Stain (surgical/deep wound)     Status: None (Preliminary result)   Collection Time: 05/02/22  4:41 PM   Specimen: PATH Soft tissue  Result Value Ref Range Status   Specimen Description TISSUE  Final   Special Requests SQ TISSUE  Final   Gram Stain NO WBC SEEN NO ORGANISMS SEEN   Final   Culture   Final    NO GROWTH < 12 HOURS Performed at [facility], [place] [address], [place], [place] [address]    Report Status PENDING  Incomplete  Aerobic/Anaerobic Culture w Gram Stain (surgical/deep wound)     Status: None (Preliminary result)   Collection Time: 05/02/22  5:18 PM   Specimen: PATH Soft tissue  Result Value Ref Range Status   Specimen Description TISSUE  Final   Special Requests THORACIC 9 AND 10  LAMINECTOMY  Final   Gram Stain NO WBC SEEN NO ORGANISMS SEEN   Final   Culture   Final    NO GROWTH < 12 HOURS Performed at [facility], [place] [address] [place], [place] [address]    Report Status PENDING  Incomplete    Studies/Results: [place] EKG SITE RITE  Result Date: 05/03/2022 If Site Rite image not attached, placement could not be confirmed due to current cardiac rhythm.  DG THORACOLUMABAR SPINE  Result Date: 05/02/2022 CLINICAL DATA:  Thoracic laminectomy. EXAM: THORACOLUMBAR SPINE 1V COMPARISON:  None Available. FINDINGS: Two intraoperative fluoroscopic spot images provided. The total fluoroscopic time is 7.7 seconds and air Karma 5.13 mGy. IMPRESSION: Intraoperative fluoroscopic spot images. Electronically Signed   By: Anner Crete M.D.   On: 05/02/2022 19:34   DG C-Arm 1-60 Min-No Report  Result Date: 05/02/2022 Fluoroscopy was utilized by the requesting physician.  No radiographic interpretation.      Assessment/Plan:  INTERVAL HISTORY:   Status post neurosurgery  Principal Problem:   Osteomyelitis of vertebra of thoracolumbar region [facility]) Active Problems:   DM2 (diabetes mellitus, type 2) (HCC)   Discitis of thoracolumbar region   Chronic  diastolic CHF (congestive heart failure) (HCC)   Chronic pain   Midline low back pain   Numbness   Osteomyelitis (HCC)    John Ware is a 56 y.o. 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    During that admission he was found to have MRSA bacteremia.   He also had worsening discitis and osteomyelitis at L1-2 and L2-3 with stenosis of L1 and L2 with progressive weakness.   He was taken the operating room by Dr. Ellene Route on June 05, 2021 and underwent lumbar laminotomies decompression L1-L2 and L2-L3 on the left with decompression dural tube and nerve roots L1-L2 and 3.    He was initially treated with daptomycin, and then changed to vancomycin and rifampin ultimately transition to chronic indefinite doxycycline therapy  UNfortunately he is now readmitted with extensive infection in the thoracic spine with discitis and osteomyelitis that are acute as well as an epidural phlegmon abscess as well as soft tissue abscess.  IR guided aspirate  has yielded pseudomonas  #1 Acute osteomyelitis discitis with epidural abscess   MRI repeated yesterday showed persistent osteomyelitis T9- to T12 which is worsening with septic arthritis T11-T12 persistent epidural abscess extending from T9-T12 with severe spinal canal stenosis at T10-T11 T11-T12 cord abnormality at T10-T11 paraspinal soft tissues of T9-T12 and abscess in the left posterior soft tissue  He has now undergone decompression and debridement of subcutaneous abscess is well as decompression and debridement of an  epidural osteomyelitis via laminectomy from T10-T11 Dr. Ellene Route.  A culture of the material in the abscess was thickened scarlike tissue which was removed and sent for culture.  Underneath the lamina there was no gross pus but some thickened and edematous epidural tissue which was resected and sent for culture along with laminar bone and capsule material at T10-11 and T11-12.  We will follow-up intraoperative cultures.  Hopefully no new organisms are found.  We will continue with cefepime IV and oral doxycycline.  We will plan on giving him 6 weeks of postoperative cefepime along with oral doxycycline and then switch to  ciprofloxacin doxycycline chronically   Diagnosis: Osteomyelitis epidural abscess  Culture Result: Pseudomonas aeruginosa but prior cultures had yielded MSSA and MRSA see above  Allergies  Allergen Reactions   Tizanidine Other (See Comments)    Leg cramps    OPAT Orders Discharge antibiotics to be given via PICC line Discharge antibiotics: Cefepime 2g IV Q8H with Doxycycline 100 mg PO BID  Aim for Vancomycin trough 15-20 or AUC 400-550 (unless otherwise indicated) Duration: 6 weeks End Date: June 13, 2022  [facility] Per Protocol:  Home health RN for IV administration and teaching; PICC line care and labs.    Labs weekly while on IV antibiotics: _x_ CBC with differential _x_ BMP  _x_ CRP _x_ ESR  _x_ Please pull PIC at completion of IV antibiotics __ Please leave PIC in place until doctor has seen patient or been notified  Fax weekly labs to [phone]  Clinic Follow Up Appt:    [place] has an appointment on 05/29/2022  at 145 PM with Dr. West Bali at   [facility]  which is located in the [facility] at  [address] in [place].  [address], which is located to the left of the elevators.  Phone: [phone]  Fax: [phone]  [email or web address]  He should arrive 30 minutes prior to his appointment.  I spent 53 minutes with the patient including than 50% of the time in face to face counseling of the patient regarding his severe thoracic spine infection, along with review of medical records in preparation for the visit and during the visit and in coordination of his care.   I will be on-call this weekend available for questions and follow-up the cultures.  A new team will take over on Monday    .  LOS: 11 days   Alcide Evener 05/03/2022, 11:29 AM

## 2022-05-03 NOTE — Progress Notes (Signed)
Hemovac discontinued per order.  Covered with betadine per order and dressing.  Pt tolerated well.

## 2022-05-03 NOTE — Progress Notes (Signed)
Pt refused to get BS reads for 1700.

## 2022-05-03 NOTE — Progress Notes (Signed)
[facility] PROGRESS NOTE    Progress Note  John Ware  NWG:[id number] DOB: July 03, 1966 DOA: 04/22/2022 PCP: Junie Spencer, FNP     Brief Narrative:   John Ware is an 56 y.o. male past medical history of tricuspid valve endocarditis, discitis and osteomyelitis, diabetes mellitus type 2, chronic diastolic heart failure comes in for progressive back pain was discharged home on 06/11/2021 after prolonged hospitalization for MSSA bacteremia and tricuspid endocarditis.  Underwent T10-T11 disc aspiration by IR, who grew Pseudomonas he is currently on bank and cefepime.   Assessment/Plan:   Osteomyelitis of vertebra of thoracolumbar region [facility]) Patient underwent T10-T11 disc aspiration, culture grew Pseudomonas ID recommended Doxy and cefepime for minimum of 6 weeks postoperatively if surgery is performed followed by lifelong doxycycline and ciprofloxacin. Neurosurgery recommended surgical intervention with  decompression and debridement of abscess via laminectomy of the T10 to T11. PT evaluated the patient and recommended inpatient rehab. PICC line to be inserted 05/04/2022. His pain is not controlled increase Neurontin.  Diabetes mellitus type 2: With an A1c of 8.4, he was started on dexamethasone blood glucose has become erratic. Start him on long-acting insulin continue sliding scale insulin.  Chronic diastolic heart failure: Appears euvolemic continue current medication.  Chronic pain syndrome: Continue baclofen MS Contin and oxycodone. Continue IV morphine for breakthrough pain.  Skin rash: Improved with hydroxyzine.  Stage I sacral decubitus ulcer of the left buttock RN Pressure Injury Documentation: Pressure Injury 06/11/21 Buttocks Left Stage 1 -  Intact skin with non-blanchable redness of a localized area usually over a bony prominence. (Active)  06/11/21 1617  Location: Buttocks  Location Orientation: Left  Staging: Stage 1 -  Intact skin with  non-blanchable redness of a localized area usually over a bony prominence.  Wound Description (Comments):   Present on Admission: Yes     Pressure Injury 06/11/21 Buttocks Right Stage 1 -  Intact skin with non-blanchable redness of a localized area usually over a bony prominence. (Active)  06/11/21 1618  Location: Buttocks  Location Orientation: Right  Staging: Stage 1 -  Intact skin with non-blanchable redness of a localized area usually over a bony prominence.  Wound Description (Comments):   Present on Admission: Yes    Estimated body mass index is 23.47 kg/m as calculated from the following:   Height as of this encounter: 5\' 10"  (1.778 m).   Weight as of this encounter: 74.2 kg.  DVT prophylaxis: lovenox Family Communication:Wife Status is: Inpatient Remains inpatient appropriate because: Osteomyelitis and abscess of the back    Code Status:     Code Status Orders  (From admission, onward)           Start     Ordered   04/22/22 2233  Full code  Continuous        04/22/22 2233           Code Status History     Date Active Date Inactive Code Status Order ID Comments User Context   06/11/2021 1552 06/26/2021 1813 Full Code 13/08/2020  [id number] Inpatient   05/28/2021 1925 06/11/2021 1538 Full Code 06/13/2021  [id number], MD ED   01/31/2021 1453 03/15/2021 2102 Full Code 2103  [id number], DO Inpatient   10/14/2017 1803 10/15/2017 1738 Full Code 10/17/2017  [id number], MD Inpatient   12/13/2014 1654 12/14/2014 1323 Full Code 12/16/2014  [id number], MD Inpatient         IV Access:  Peripheral IV   Procedures and diagnostic studies:   [place] EKG SITE RITE  Result Date: 05/03/2022 If Site Rite image not attached, placement could not be confirmed due to current cardiac rhythm.  DG THORACOLUMABAR SPINE  Result Date: 05/02/2022 CLINICAL DATA:  Thoracic laminectomy. EXAM: THORACOLUMBAR SPINE 1V COMPARISON:  None Available. FINDINGS:  Two intraoperative fluoroscopic spot images provided. The total fluoroscopic time is 7.7 seconds and air Karma 5.13 mGy. IMPRESSION: Intraoperative fluoroscopic spot images. Electronically Signed   By: Anner Crete M.D.   On: 05/02/2022 19:34   DG C-Arm 1-60 Min-No Report  Result Date: 05/02/2022 Fluoroscopy was utilized by the requesting physician.  No radiographic interpretation.     Medical Consultants:   None.   Subjective:    John Ware pain is not controlled.  Objective:    Vitals:   05/02/22 1958 05/02/22 2358 05/03/22 0345 05/03/22 0818  BP: (!) 126/93 (!) 120/96 (!) 118/99 (!) 116/97  Pulse: 79 88 (!) 105 96  Resp: 18 18 18 16   Temp:  97.6 F (36.4 C) 98 F (36.7 C) 98.3 F (36.8 C)  TempSrc:  Oral    SpO2: 97% 96% 96% 97%  Weight:      Height:       SpO2: 97 % O2 Flow Rate (L/min): 2 L/min   Intake/Output Summary (Last 24 hours) at 05/03/2022 1155 Last data filed at 05/03/2022 0937 Gross per 24 hour  Intake 1400 ml  Output 1805 ml  Net -405 ml    Filed Weights   04/23/22 1441 04/24/22 0423 05/02/22 0500  Weight: 76 kg 76.7 kg 74.2 kg    Exam: General exam: In no acute distress. Respiratory system: Good air movement and clear to auscultation. Cardiovascular system: S1 & S2 heard, RRR. No JVD. Gastrointestinal system: Abdomen is nondistended, soft and nontender.  Extremities: No pedal edema. Skin: No rashes, lesions or ulcers  Data Reviewed:    Labs: Basic Metabolic Panel: Recent Labs  Lab 04/27/22 0441 04/28/22 0131 04/30/22 0420  NA 134* 135 136  K 4.2 4.4 4.2  CL 101 101 103  CO2 26 23 24   GLUCOSE 137* 120* 117*  BUN 7 8 10   CREATININE 0.63 0.65 0.64  CALCIUM 8.8* 9.1 9.2    GFR Estimated Creatinine Clearance: 109 mL/min (by C-G formula based on SCr of 0.64 mg/dL). Liver Function Tests: No results for input(s): "AST", "ALT", "ALKPHOS", "BILITOT", "PROT", "ALBUMIN" in the last 168 hours. No results for input(s):  "LIPASE", "AMYLASE" in the last 168 hours. No results for input(s): "AMMONIA" in the last 168 hours. Coagulation profile No results for input(s): "INR", "PROTIME" in the last 168 hours. COVID-19 Labs  No results for input(s): "DDIMER", "FERRITIN", "LDH", "CRP" in the last 72 hours.  Lab Results  Component Value Date   SARSCOV2NAA NEGATIVE 05/28/2021   SARSCOV2NAA POSITIVE (A) 01/31/2021   [place] NEGATIVE 08/23/2020    CBC: Recent Labs  Lab 04/27/22 0441 04/28/22 0131  WBC 10.7* 11.5*  HGB 9.5* 9.7*  HCT 28.6* 30.4*  MCV 78.1* 79.0*  PLT 501* 526*    Cardiac Enzymes: No results for input(s): "CKTOTAL", "CKMB", "CKMBINDEX", "TROPONINI" in the last 168 hours. BNP (last 3 results) No results for input(s): "PROBNP" in the last 8760 hours. CBG: Recent Labs  Lab 05/01/22 1639 05/02/22 1146 05/02/22 1430 05/02/22 1750 05/03/22 0631  GLUCAP 143* 134* 140* 111* 239*    D-Dimer: No results for input(s): "DDIMER" in the last 72 hours. Hgb A1c: No  results for input(s): "HGBA1C" in the last 72 hours. Lipid Profile: No results for input(s): "CHOL", "HDL", "LDLCALC", "TRIG", "CHOLHDL", "LDLDIRECT" in the last 72 hours. Thyroid function studies: No results for input(s): "TSH", "T4TOTAL", "T3FREE", "THYROIDAB" in the last 72 hours.  Invalid input(s): "FREET3" Anemia work up: No results for input(s): "VITAMINB12", "FOLATE", "FERRITIN", "TIBC", "IRON", "RETICCTPCT" in the last 72 hours. Sepsis Labs: Recent Labs  Lab 04/27/22 0441 04/28/22 0131  WBC 10.7* 11.5*    Microbiology Recent Results (from the past 240 hour(s))  Body fluid culture w Gram Stain     Status: Abnormal   Collection Time: 04/24/22 11:37 AM   Specimen: PATH Disc; Body Fluid  Result Value Ref Range Status   Specimen Description FLUID  Final   Special Requests  DISC  Final   Gram Stain   Final    RARE WBC PRESENT, PREDOMINANTLY PMN RARE GRAM VARIABLE ROD Performed at [facility] [address], [place], [place] [address]    Culture PSEUDOMONAS AERUGINOSA (A)  Final   Report Status 04/26/2022 FINAL  Final   Organism ID, Bacteria PSEUDOMONAS AERUGINOSA  Final      Susceptibility   Pseudomonas aeruginosa - MIC*    CEFTAZIDIME 4 SENSITIVE Sensitive     CIPROFLOXACIN <=0.25 SENSITIVE Sensitive     GENTAMICIN <=1 SENSITIVE Sensitive     IMIPENEM 1 SENSITIVE Sensitive     PIP/TAZO 8 SENSITIVE Sensitive     CEFEPIME 2 SENSITIVE Sensitive     * PSEUDOMONAS AERUGINOSA  Aerobic/Anaerobic Culture w Gram Stain (surgical/deep wound)     Status: None   Collection Time: 04/25/22 12:10 PM   Specimen: Abscess  Result Value Ref Range Status   Specimen Description ABSCESS  Final   Special Requests NONE  Final   Gram Stain   Final    FEW WBC PRESENT, PREDOMINANTLY PMN NO ORGANISMS SEEN    Culture   Final    FEW PSEUDOMONAS AERUGINOSA NO ANAEROBES ISOLATED Performed at [facility], [address] [place], [place] [address]    Report Status 04/30/2022 FINAL  Final   Organism ID, Bacteria PSEUDOMONAS AERUGINOSA  Final      Susceptibility   Pseudomonas aeruginosa - MIC*    CEFTAZIDIME 4 SENSITIVE Sensitive     CIPROFLOXACIN <=0.25 SENSITIVE Sensitive     GENTAMICIN <=1 SENSITIVE Sensitive     IMIPENEM 1 SENSITIVE Sensitive     PIP/TAZO 8 SENSITIVE Sensitive     CEFEPIME 2 SENSITIVE Sensitive     * FEW PSEUDOMONAS AERUGINOSA  Surgical pcr screen     Status: None   Collection Time: 05/02/22 10:33 AM   Specimen: Nasal Mucosa; Nasal Swab  Result Value Ref Range Status   MRSA, PCR NEGATIVE NEGATIVE Final   Staphylococcus aureus NEGATIVE NEGATIVE Final    Comment: (NOTE) The Xpert SA Assay (FDA approved for NASAL specimens in patients 26 years of age and older), is one component of a comprehensive surveillance program. It is not intended to diagnose infection nor to guide or monitor treatment. Performed at [facility], [place] [address] [place],  [place] [address]   Aerobic/Anaerobic Culture w Gram Stain (surgical/deep wound)     Status: None (Preliminary result)   Collection Time: 05/02/22  4:41 PM   Specimen: PATH Soft tissue  Result Value Ref Range Status   Specimen Description TISSUE  Final   Special Requests SQ TISSUE  Final   Gram Stain NO WBC SEEN NO  ORGANISMS SEEN   Final   Culture   Final    NO GROWTH < 12 HOURS Performed at [facility], [address] [place], [place] [address]    Report Status PENDING  Incomplete  Aerobic/Anaerobic Culture w Gram Stain (surgical/deep wound)     Status: None (Preliminary result)   Collection Time: 05/02/22  5:18 PM   Specimen: PATH Soft tissue  Result Value Ref Range Status   Specimen Description TISSUE  Final   Special Requests THORACIC 9 AND 10 LAMINECTOMY  Final   Gram Stain NO WBC SEEN NO ORGANISMS SEEN   Final   Culture   Final    NO GROWTH < 12 HOURS Performed at [facility], [address] [place], [place] [address]    Report Status PENDING  Incomplete     Medications:    baclofen  5 mg Oral TID   doxycycline  100 mg Oral Q12H   gabapentin  100 mg Oral BID   insulin aspart  0-6 Units Subcutaneous TID WC   morphine  45 mg Oral Q12H   senna-docusate  1 tablet Oral BID   sodium chloride flush  3 mL Intravenous Q12H   Continuous Infusions:  sodium chloride Stopped (04/23/22 1631)   sodium chloride     ceFEPime (MAXIPIME) IV 2 g (05/03/22 0509)      LOS: 11 days   Marinda Elk  [facility]  05/03/2022, 11:55 AM

## 2022-05-03 NOTE — Progress Notes (Signed)
Occupational Therapy Treatment Patient Details Name: John Ware MRN: [id number] DOB: [birth year] Today's Date: 05/03/2022   History of present illness Pt is a 56 y/o male presenting on 8/28 with progressive back pain. Admitted with osteomyelitis. S/P T10-11 disc aspiration via laminectomy 9/7, Noted prolonged hopsitalization in 10/22 due to MSSA bacteremia and tricuspid endocarditis. PMH includes: tricuspid valve endocarditis, discitits and osteomyelitiis, DM2, chronic diastolic heart failure.   OT comments  PTA patient reports independent and driving, progressive decline over the past 3 weeks needing more assist for ADLs and using wc right before admission.  Admitted for above and presents with problem list below, including pain, weakness, decreased L shoulder ROM, and impaired balance.  Limited session today as pt declined OOB or attempts to stand, but completing bed mobility with supervision, lateral scoot to HOB (minimally) with min guard, and ADLs with setup to max assist. Will follow acutely to optimize independence and safety with ADLs/ mobility to progress towards PLOF.    Recommendations for follow up therapy are one component of a multi-disciplinary discharge planning process, led by the attending physician.  Recommendations may be updated based on patient status, additional functional criteria and insurance authorization.    Follow Up Recommendations  Acute inpatient rehab (3hours/day)    Assistance Recommended at Discharge Set up Supervision/Assistance  Patient can return home with the following  A lot of help with bathing/dressing/bathroom;Assistance with cooking/housework;Assist for transportation;Help with stairs or ramp for entrance   Equipment Recommendations  Other (comment) (defer)    Recommendations for Other Services      Precautions / Restrictions Precautions Precautions: Back;Fall Precaution Booklet Issued: No Precaution Comments: reviewed with pt Required  Braces or Orthoses:  (no brace needed) Restrictions Weight Bearing Restrictions: No       Mobility Bed Mobility Overal bed mobility: Needs Assistance Bed Mobility: Rolling, Sidelying to Sit, Sit to Sidelying Rolling: Supervision Sidelying to sit: Supervision     Sit to sidelying: Supervision General bed mobility comments: for safety    Transfers Overall transfer level: Needs assistance   Transfers: Bed to chair/wheelchair/BSC            Lateral/Scoot Transfers: Min guard General transfer comment: lateral scoot to HOB (minimally)prior to returning supine with min guard for safety     Balance Overall balance assessment: Needs assistance Sitting-balance support: No upper extremity supported, Feet supported Sitting balance-[name]: Fair Sitting balance - Comments: preference to 1 UE support dynamically                                   ADL either performed or assessed with clinical judgement   ADL Overall ADL's : Needs assistance/impaired     Grooming: Set up;Sitting           Upper Body Dressing : Set up;Sitting   Lower Body Dressing: Maximal assistance;Sitting/lateral leans Lower Body Dressing Details (indicate cue type and reason): simulated EOB Toilet Transfer: Min Pension scheme manager Details (indicate cue type and reason): simulated lateral scoots to [facility]         Functional mobility during ADLs: Min guard General ADL Comments: limited to EOB, pt declines standing or transferring today    Extremity/Trunk Assessment Upper Extremity Assessment Upper Extremity Assessment: LUE deficits/detail LUE Deficits / Details: limited shoulder flexion to approx 90*, distally WFL LUE Sensation: history of peripheral neuropathy LUE Coordination: decreased gross motor   Lower Extremity Assessment Lower Extremity Assessment: Defer  to PT evaluation   Cervical / Trunk Assessment Cervical / Trunk Assessment: Back Surgery    Vision   Vision  Assessment?: No apparent visual deficits   Perception     Praxis      Cognition Arousal/Alertness: Awake/alert Behavior During Therapy: WFL for tasks assessed/performed Overall Cognitive Status: Within Functional Limits for tasks assessed                                 General Comments: pts cognition appears WFL, good recall of needs from prior admission (last year) reports recalling what OT has taught him and therefore not needing OT--diffulty understanding the physical component of acutally completing the tasks.        Exercises      Shoulder Instructions       General Comments      Pertinent Vitals/ Pain       Pain Assessment Pain Assessment: Faces Faces Pain Scale: Hurts even more Pain Location: back, LE Pain Descriptors / Indicators: Discomfort, Grimacing, Operative site guarding, Pins and needles Pain Intervention(s): Limited activity within patient's tolerance, Monitored during session, Repositioned  Home Living Family/patient expects to be discharged to:: Private residence Living Arrangements: Spouse/significant other;Children Available Help at Discharge: Family Type of Home: House Home Access: Ramped entrance     Home Layout: One level     Bathroom Shower/Tub: Arts development officer Toilet: Handicapped height     Home Equipment: Pharmacist, hospital (2 wheels);Cane - single point;Wheelchair - Careers adviser (comment) (slideboard)          Prior Functioning/Environment              Frequency  Min 2X/week        Progress Toward Goals  OT Goals(current goals can now be found in the care plan section)     Acute Rehab OT Goals Patient Stated Goal: get better OT Goal Formulation: With patient Time For Goal Achievement: 05/17/22 Potential to Achieve Goals: Good  Plan      Co-evaluation    PT/OT/SLP Co-Evaluation/Treatment: Yes Reason for Co-Treatment: For patient/therapist safety;To address functional/ADL  transfers   OT goals addressed during session: ADL's and self-care      AM-PAC OT "6 Clicks" Daily Activity     Outcome Measure   Help from another person eating meals?: None Help from another person taking care of personal grooming?: A Little Help from another person toileting, which includes using toliet, bedpan, or urinal?: A Lot Help from another person bathing (including washing, rinsing, drying)?: A Lot Help from another person to put on and taking off regular upper body clothing?: A Little Help from another person to put on and taking off regular lower body clothing?: A Lot 6 Click Score: 16    End of Session    OT Visit Diagnosis: Other abnormalities of gait and mobility (R26.89);Muscle weakness (generalized) (M62.81);Pain Pain - part of body:  (back, LEs)   Activity Tolerance Patient tolerated treatment well   Patient Left in bed;with call bell/phone within reach   Nurse Communication Mobility status        Time: 7062-3762 OT Time Calculation (min): 20 min  Charges: OT General Charges $OT Visit: 1 Visit OT Evaluation $OT Eval Moderate Complexity: 1 Mod  Barry Brunner, OT Acute Rehabilitation Services Office [phone]   Chancy Milroy 05/03/2022, 11:33 AM

## 2022-05-03 NOTE — Progress Notes (Signed)
? ?  Inpatient Rehab Admissions Coordinator : ? ?Per therapy recommendations, patient was screened for CIR candidacy by Gerhardt Gleed RN MSN.  At this time patient appears to be a potential candidate for CIR. I will place a rehab consult per protocol for full assessment. Please call me with any questions. ? ?Caden Fukushima RN MSN ?Admissions Coordinator ?[phone] ?  ?

## 2022-05-03 NOTE — Evaluation (Signed)
Physical Therapy Evaluation Patient Details Name: John Ware MRN: [id number] DOB: 1966-02-22 Today's Date: 05/03/2022  History of Present Illness  Pt is a 56 y/o male presenting on 8/28 with progressive back pain. Admitted with osteomyelitis. S/P T10-11 disc aspiration via laminectomy 9/7, Noted prolonged hopsitalization in 10/22 due to MSSA bacteremia and tricuspid endocarditis. PMH includes: tricuspid valve endocarditis, discitits and osteomyelitiis, DM2, chronic diastolic heart failure.  Clinical Impression  Patient presents with decreased mobility due to LE weakness, decreased sensation, decreased balance, decreased activity tolerance and high risk for falls.  Patient tolerated up at EOB only today declining OOB or attempts to stand.  He was previously on acute inpatient rehab and may need once again prior to d/c home depending on progress.  Will continue acute level PT to further progress mobility as tolerated and further determine post acute PT needs.      Recommendations for follow up therapy are one component of a multi-disciplinary discharge planning process, led by the attending physician.  Recommendations may be updated based on patient status, additional functional criteria and insurance authorization.  Follow Up Recommendations Acute inpatient rehab (3hours/day)      Assistance Recommended at Discharge Frequent or constant Supervision/Assistance  Patient can return home with the following  A little help with bathing/dressing/bathroom;A lot of help with walking and/or transfers;Assistance with cooking/housework;Help with stairs or ramp for entrance;Assist for transportation    Equipment Recommendations None recommended by PT  Recommendations for Other Services  Rehab consult    Functional Status Assessment Patient has had a recent decline in their functional status and demonstrates the ability to make significant improvements in function in a reasonable and predictable  amount of time.     Precautions / Restrictions Precautions Precautions: Back;Fall Precaution Booklet Issued: No Precaution Comments: reviewed with pt Required Braces or Orthoses:  (no brace needed) Restrictions Weight Bearing Restrictions: No      Mobility  Bed Mobility Overal bed mobility: Needs Assistance Bed Mobility: Rolling, Sidelying to Sit, Sit to Sidelying Rolling: Supervision Sidelying to sit: Supervision     Sit to sidelying: Supervision General bed mobility comments: for safety    Transfers Overall transfer level: Needs assistance   Transfers: Bed to chair/wheelchair/BSC            Lateral/Scoot Transfers: Min guard General transfer comment: lateral scoot to HOB (minimally)prior to returning supine with min guard for safety, but declined attempts to stand    Ambulation/Gait               General Gait Details: declined attempts  Stairs            Wheelchair Mobility    Modified Rankin (Stroke Patients Only)       Balance Overall balance assessment: Needs assistance   Sitting balance-[name]: Fair Sitting balance - Comments: preference to 1 UE support dynamically, but weak postural muscles noted with posterior pelvic tilt, rounded spine and forward head and shoulders and preferencing UE support       Standing balance comment: NT                             Pertinent Vitals/Pain Pain Assessment Faces Pain Scale: Hurts even more Pain Location: back, LE Pain Descriptors / Indicators: Discomfort, Grimacing, Operative site guarding, Pins and needles Pain Intervention(s): Repositioned, Limited activity within patient's tolerance, Monitored during session    Home Living Family/patient expects to be discharged to:: Private residence Living  Arrangements: Spouse/significant other;Children Available Help at Discharge: Family Type of Home: House Home Access: Ramped entrance       Home Layout: One level Home  Equipment: Pharmacist, hospital (2 wheels);Cane - single point;Wheelchair - Careers adviser (comment)      Prior Function Prior Level of Function : Independent/Modified Independent             Mobility Comments: progressive decline over the past 3 weeks- progressing to use of wheelchair ADLs Comments: pt reports independent with ADLs/IADLs and driving, progressive decline with spouse assisting with ADLs over the last 3 weeks     Hand Dominance   Dominant Hand: Left    Extremity/Trunk Assessment   Upper Extremity Assessment Upper Extremity Assessment: Defer to OT evaluation LUE Deficits / Details: limited shoulder flexion to approx 90*, distally WFL LUE Sensation: history of peripheral neuropathy LUE Coordination: decreased gross motor    Lower Extremity Assessment Lower Extremity Assessment: RLE deficits/detail;LLE deficits/detail RLE Deficits / Details: AROM WFL, strength hip flexion 3-/5, knee extension 3+/5, ankle DF at least 3/5; numbness throughout LE, but hypersensitivity around trunk since surgery RLE Sensation: decreased light touch LLE Deficits / Details: AROM WFL, strength hip flexion 3/5, knee extension 4-/5, ankle DF at least 3/5; numbness throughout LE worse than R, but hypersensitivity around trunk since surgery LLE Sensation: decreased light touch    Cervical / Trunk Assessment Cervical / Trunk Assessment: Back Surgery  Communication   Communication: No difficulties  Cognition Arousal/Alertness: Awake/alert Behavior During Therapy: WFL for tasks assessed/performed Overall Cognitive Status: Within Functional Limits for tasks assessed                                 General Comments: pts cognition appears WFL, good recall of needs from prior admission (last year) reports recalling what OT has taught him and therefore not needing OT--diffulty understanding the physical component of acutally completing the tasks.        General Comments  General comments (skin integrity, edema, etc.): Patient initially agreeable to EOB, but quickly and emphatically declined other mobility and ADL tasks.  Encouragement provided and agreeable to try OOB next session    Exercises     Assessment/Plan    PT Assessment Patient needs continued PT services  PT Problem List Decreased strength;Decreased mobility;Impaired sensation;Decreased balance;Decreased safety awareness;Pain       PT Treatment Interventions DME instruction;Balance training;Gait training;Functional mobility training;Patient/family education;Wheelchair mobility training;Therapeutic activities;Therapeutic exercise    PT Goals (Current goals can be found in the Care Plan section)  Acute Rehab PT Goals Patient Stated Goal: to return to independent PT Goal Formulation: With patient Time For Goal Achievement: 05/17/22 Potential to Achieve Goals: Good    Frequency Min 5X/week     Co-evaluation PT/OT/SLP Co-Evaluation/Treatment: Yes Reason for Co-Treatment: For patient/therapist safety PT goals addressed during session: Mobility/safety with mobility;Balance OT goals addressed during session: ADL's and self-care       AM-PAC PT "6 Clicks" Mobility  Outcome Measure Help needed turning from your back to your side while in a flat bed without using bedrails?: None Help needed moving from lying on your back to sitting on the side of a flat bed without using bedrails?: None Help needed moving to and from a bed to a chair (including a wheelchair)?: Total Help needed standing up from a chair using your arms (e.g., wheelchair or bedside chair)?: Total Help needed to walk in hospital room?: Total  Help needed climbing 3-5 steps with a railing? : Total 6 Click Score: 12    End of Session   Activity Tolerance: Other (comment) (self limited) Patient left: in bed   PT Visit Diagnosis: Other abnormalities of gait and mobility (R26.89);Muscle weakness (generalized) (M62.81)     Time: 8546-2703 PT Time Calculation (min) (ACUTE ONLY): 20 min   Charges:   PT Evaluation $PT Eval Moderate Complexity: 1 Mod          Cyndi Gelena Klosinski, PT Acute Rehabilitation Services Office:[phone] 05/03/2022   Elray Mcgregor 05/03/2022, 1:39 PM

## 2022-05-03 NOTE — TOC Initial Note (Signed)
Transition of Care [facility]) - Initial/Assessment Note    Patient Details  Name: John Ware MRN: [id number] Date of Birth: October 19, 1965  Transition of Care [facility]) CM/SW Contact:    Kermit Balo, RN Phone Number: 05/03/2022, 3:15 PM  Clinical Narrative:                 Pt is from home with his spouse. He states wife has provided IV abx at home previously. Pt is going to require prolonged IV abx therapy. Wife able to provide needed transportation.  Current recommendations are for CIR. Awaiting eval.   TOC following.   Expected Discharge Plan: IP Rehab Facility Barriers to Discharge: Continued Medical Work up   Patient Goals and CMS Choice   CMS Medicare.gov Compare Post Acute Care list provided to:: Patient Choice offered to / list presented to : Patient  Expected Discharge Plan and Services Expected Discharge Plan: IP Rehab Facility   Discharge Planning Services: CM Consult Post Acute Care Choice: IP Rehab Living arrangements for the past 2 months: Single Family Home                                      Prior Living Arrangements/Services Living arrangements for the past 2 months: Single Family Home Lives with:: Spouse Patient language and need for interpreter reviewed:: Yes Do you feel safe going back to the place where you live?: Yes      Need for Family Participation in Patient Care: Yes (Comment) Care giver support system in place?: Yes (comment) Current home services: DME (wheelchair/ walker/ shower seat/ elevated commode) Criminal Activity/Legal Involvement Pertinent to Current Situation/Hospitalization: No - Comment as needed  Activities of Daily Living Home Assistive Devices/Equipment: Environmental consultant (specify type), Shower chair with back, Wheelchair ADL Screening (condition at time of admission) Patient's cognitive ability adequate to safely complete daily activities?: Yes Is the patient deaf or have difficulty hearing?: No Does the patient have difficulty  seeing, even when wearing glasses/contacts?: No Does the patient have difficulty concentrating, remembering, or making decisions?: No Patient able to express need for assistance with ADLs?: Yes Does the patient have difficulty dressing or bathing?: No Independently performs ADLs?: No Communication: Independent Dressing (OT): Independent Grooming: Independent Feeding: Independent Bathing: Independent Toileting: Needs assistance Is this a change from baseline?: Change from baseline, expected to last >3days In/Out Bed: Needs assistance Is this a change from baseline?: Pre-admission baseline Walks in Home: Needs assistance Is this a change from baseline?: Pre-admission baseline Does the patient have difficulty walking or climbing stairs?: Yes Weakness of Legs: Both Weakness of Arms/Hands: Both  Permission Sought/Granted                  Emotional Assessment Appearance:: Appears stated age Attitude/Demeanor/Rapport: Engaged Affect (typically observed): Accepting Orientation: : Oriented to Place, Oriented to  Time, Oriented to Situation, Oriented to Self   Psych Involvement: No (comment)  Admission diagnosis:  Numbness [R20.0] Osteomyelitis of thoracic spine (HCC) [M46.24] Osteomyelitis of other site, unspecified type (HCC) [M86.9] Midline low back pain, unspecified chronicity, unspecified whether sciatica present [M54.50] Patient Active Problem List   Diagnosis Date Noted   Midline low back pain    Numbness    Osteomyelitis (HCC)    Chronic diastolic CHF (congestive heart failure) (HCC) 04/23/2022   Chronic pain 04/23/2022   Osteomyelitis of vertebra of thoracolumbar region (HCC) 04/22/2022   Presence of retained hardware 10/17/2021  Infective myositis 08/29/2021   Discitis of thoracolumbar region 07/31/2021   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   MRSA infection    Diabetic peripheral neuropathy (HCC)    Septic discitis of lumbar region 06/11/2021   Infection of spine (HCC)  05/28/2021   Medication monitoring encounter 05/09/2021   MSSA (methicillin susceptible Staphylococcus aureus) infection 05/09/2021   History of COVID-19    Psoas abscess (HCC)    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   Right shoulder pain 02/14/2021   Chest wall abscess 02/14/2021   Normocytic anemia 02/14/2021   Thrombocytosis 02/14/2021   Endocarditis 02/05/2021   Vision changes 02/04/2021   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   Pressure injury of skin 02/01/2021   HTN (hypertension) 02/01/2021   Anxiety 02/01/2021   Left shoulder pain 02/01/2021   Hyponatremia 01/31/2021   Positive colorectal cancer screening using Cologuard test 12/28/2020   DOE (dyspnea on exertion) 10/20/2020   Diabetic polyneuropathy associated with type 2 diabetes mellitus (HCC) 10/09/2020   DM2 (diabetes mellitus, type 2) (HCC) 10/30/2018   Lumbar stenosis with neurogenic claudication 10/14/2017   DDD (degenerative disc disease), lumbar 02/17/2017   Transient weakness of right lower extremity 02/05/2017   Hyperlipidemia associated with type 2 diabetes mellitus (HCC) 10/24/2016   Depression, recurrent (HCC) 10/24/2016   Right knee pain 05/21/2016   DDD (degenerative disc disease), lumbosacral 05/21/2016   Herniation of nucleus pulposus of cervical intervertebral disc without myelopathy 02/29/2016   Carpal tunnel syndrome 02/08/2016   Cervical spondylosis with myelopathy 01/17/2016   Lumbar stenosis 12/13/2014   PCP:  Junie Spencer, FNP Pharmacy:   [facility] [place], [place] - [address] [place] [address] Phone: [phone] Fax: [phone]  [facility] [address] [place] [address] Phone: [phone] Fax: [phone]     Social Determinants of Health (SDOH) Interventions    Readmission Risk Interventions    02/16/2021   12:11 PM  Readmission Risk Prevention Plan  Transportation Screening  Complete  PCP or Specialist Appt within 3-5 Days Complete  HRI or Home Care Consult Complete  Social Work Consult for Recovery Care Planning/Counseling Complete  Palliative Care Screening Complete  Medication Review Oceanographer) Complete

## 2022-05-03 NOTE — Progress Notes (Signed)
PHARMACY CONSULT NOTE FOR:  OUTPATIENT  PARENTERAL ANTIBIOTIC THERAPY (OPAT)  Indication: Vertebral osteomyelitis  Regimen: Cefepime 2g IV Q8H with Doxycycline 100 mg PO BID  End date: 06/13/2022  IV antibiotic discharge orders are pended. To discharging provider:  please sign these orders via discharge navigator,  Select New Orders & click on the button choice - Manage This Unsigned Work.     Thank you for allowing pharmacy to be a part of this patient's care.  Jani Gravel, PharmD PGY-2 Infectious Diseases Resident  05/03/2022 8:28 AM

## 2022-05-04 DIAGNOSIS — M869 Osteomyelitis, unspecified: Secondary | ICD-10-CM | POA: Diagnosis not present

## 2022-05-04 DIAGNOSIS — M4625 Osteomyelitis of vertebra, thoracolumbar region: Secondary | ICD-10-CM | POA: Diagnosis not present

## 2022-05-04 DIAGNOSIS — M4645 Discitis, unspecified, thoracolumbar region: Secondary | ICD-10-CM | POA: Diagnosis not present

## 2022-05-04 DIAGNOSIS — I5032 Chronic diastolic (congestive) heart failure: Secondary | ICD-10-CM | POA: Diagnosis not present

## 2022-05-04 MED ORDER — SODIUM CHLORIDE 0.9% FLUSH: 10.0000 mL | INTRAVENOUS | Status: DC | PRN

## 2022-05-04 MED ORDER — SODIUM CHLORIDE 0.9% FLUSH
10.0000 mL | Freq: Two times a day (BID) | INTRAVENOUS | Status: DC
  Administered 2022-05-04 – 2022-05-08 (×9): 10 mL

## 2022-05-04 MED ORDER — CHLORHEXIDINE GLUCONATE CLOTH 2 % EX PADS
6.0000 | MEDICATED_PAD | Freq: Every day | CUTANEOUS | Status: DC
  Administered 2022-05-05 – 2022-05-08 (×4): 6 via TOPICAL

## 2022-05-04 NOTE — Progress Notes (Signed)
Inpatient Rehab Admissions:  Inpatient Rehab Consult received.  I met with patient at the bedside for rehabilitation assessment and to discuss goals and expectations of an inpatient rehab admission.  Discussed average length of stay, insurance authorization requirement for his insurance. Pt acknowledged understanding. Pt interested in pursuing CIR. Pt gave permission to contact wife [name]. Called [place] and received no answer. Not able to leave message d/t mailbox being full. Will continue to follow.  Signed: Gayland Curry, [place], [place] Admissions Coordinator [phone]

## 2022-05-04 NOTE — Progress Notes (Signed)
[facility] PROGRESS NOTE    Progress Note  [name]  [id number] DOB: October 23, 1965 DOA: 04/22/2022 PCP: Sharion Balloon, FNP     Brief Narrative:   John Ware is an 56 y.o. male past medical history of tricuspid valve endocarditis, discitis and osteomyelitis, diabetes mellitus type 2, chronic diastolic heart failure comes in for progressive back pain was discharged home on 06/11/2021 after prolonged hospitalization for MSSA bacteremia and tricuspid endocarditis.  Underwent T10-T11 disc aspiration by IR, who grew Pseudomonas he is currently on vancomycin and cefepime.  ID recommended Doxy and cefepime for minimum of 6 weeks postoperatively if surgery is performed followed by lifelong doxycycline and ciprofloxacin. Neurosurgery recommended surgical intervention with  decompression and debridement of abscess via laminectomy of the T10 to T11 performed on 05/03/2022.   Assessment/Plan:   Osteomyelitis of vertebra of thoracolumbar region [facility]) He will continue doxycycline and cefepime for 6 weeks post surgical intervention, followed by suppressive therapy Doxy and Cipro. PT evaluated the patient and recommended inpatient rehab. PICC line to be inserted 05/04/2022. His pain is not controlled increase Neurontin. Awaiting insurance approval to transfer to inpatient rehab  Diabetes mellitus type 2: A1c 7.1, he was started on dexamethasone blood glucose has become erratic. Currently on sliding scale insulin blood glucose improved.  Chronic diastolic heart failure: Appears euvolemic continue current medication.  Chronic pain syndrome: Continue baclofen MS Contin and oxycodone. Continue IV morphine for breakthrough pain.  Skin rash: Improved with hydroxyzine.  Stage I sacral decubitus ulcer of the left buttock RN Pressure Injury Documentation: Pressure Injury 06/11/21 Buttocks Left Stage 1 -  Intact skin with non-blanchable redness of a localized area usually over a bony  prominence. (Active)  06/11/21 1617  Location: Buttocks  Location Orientation: Left  Staging: Stage 1 -  Intact skin with non-blanchable redness of a localized area usually over a bony prominence.  Wound Description (Comments):   Present on Admission: Yes     Pressure Injury 06/11/21 Buttocks Right Stage 1 -  Intact skin with non-blanchable redness of a localized area usually over a bony prominence. (Active)  06/11/21 1618  Location: Buttocks  Location Orientation: Right  Staging: Stage 1 -  Intact skin with non-blanchable redness of a localized area usually over a bony prominence.  Wound Description (Comments):   Present on Admission: Yes    Estimated body mass index is 23.47 kg/m as calculated from the following:   Height as of this encounter: 5\' 10"  (1.778 m).   Weight as of this encounter: 74.2 kg.  DVT prophylaxis: lovenox Family Communication:Wife Status is: Inpatient Remains inpatient appropriate because: Osteomyelitis and abscess of the back    Code Status:     Code Status Orders  (From admission, onward)           Start     Ordered   04/22/22 2233  Full code  Continuous        04/22/22 2233           Code Status History     Date Active Date Inactive Code Status Order ID Comments User Context   06/11/2021 1552 06/26/2021 1813 Full Code [id number]  Flora Lipps Inpatient   05/28/2021 1925 06/11/2021 1538 Full Code [id number]  Tawni Millers, MD ED   01/31/2021 1453 03/15/2021 2102 Full Code [id number]  Jonnie Finner, DO Inpatient   10/14/2017 1803 10/15/2017 1738 Full Code [id number]  Kristeen Miss, MD Inpatient   12/13/2014 1654 12/14/2014 1323 Full  Code [id number]  Kristeen Miss, MD Inpatient         IV Access:   Peripheral IV   Procedures and diagnostic studies:   [place] EKG SITE RITE  Result Date: 05/03/2022 If Site Rite image not attached, placement could not be confirmed due to current cardiac rhythm.  DG THORACOLUMABAR  SPINE  Result Date: 05/02/2022 CLINICAL DATA:  Thoracic laminectomy. EXAM: THORACOLUMBAR SPINE 1V COMPARISON:  None Available. FINDINGS: Two intraoperative fluoroscopic spot images provided. The total fluoroscopic time is 7.7 seconds and air Karma 5.13 mGy. IMPRESSION: Intraoperative fluoroscopic spot images. Electronically Signed   By: Anner Crete M.D.   On: 05/02/2022 19:34   DG C-Arm 1-60 Min-No Report  Result Date: 05/02/2022 Fluoroscopy was utilized by the requesting physician.  No radiographic interpretation.     Medical Consultants:   None.   Subjective:    Ambrose L Pennino relates his pain is improved this morning.  Objective:    Vitals:   05/03/22 2031 05/03/22 2351 05/04/22 0252 05/04/22 0738  BP: (!) 143/88 (!) 103/92 103/75 113/86  Pulse: 70 60 61 67  Resp: 18 18 18 16   Temp: 98.4 F (36.9 C)   98.3 F (36.8 C)  TempSrc: Oral   Oral  SpO2: 100% 94% 99% 98%  Weight:      Height:       SpO2: 98 % O2 Flow Rate (L/min): 2 L/min   Intake/Output Summary (Last 24 hours) at 05/04/2022 0839 Last data filed at 05/04/2022 0741 Gross per 24 hour  Intake 200 ml  Output 2000 ml  Net -1800 ml    Filed Weights   04/23/22 1441 04/24/22 0423 05/02/22 0500  Weight: 76 kg 76.7 kg 74.2 kg    Exam: General exam: In no acute distress. Respiratory system: Good air movement and clear to auscultation. Cardiovascular system: S1 & S2 heard, RRR. No JVD. Gastrointestinal system: Abdomen is nondistended, soft and nontender.  Extremities: No pedal edema. Skin: No rashes, lesions or ulcers Psychiatry: Judgement and insight appear normal. Mood & affect appropriate.  Data Reviewed:    Labs: Basic Metabolic Panel: Recent Labs  Lab 04/28/22 0131 04/30/22 0420  NA 135 136  K 4.4 4.2  CL 101 103  CO2 23 24  GLUCOSE 120* 117*  BUN 8 10  CREATININE 0.65 0.64  CALCIUM 9.1 9.2    GFR Estimated Creatinine Clearance: 109 mL/min (by C-G formula based on SCr of 0.64  mg/dL). Liver Function Tests: No results for input(s): "AST", "ALT", "ALKPHOS", "BILITOT", "PROT", "ALBUMIN" in the last 168 hours. No results for input(s): "LIPASE", "AMYLASE" in the last 168 hours. No results for input(s): "AMMONIA" in the last 168 hours. Coagulation profile No results for input(s): "INR", "PROTIME" in the last 168 hours. COVID-19 Labs  No results for input(s): "DDIMER", "FERRITIN", "LDH", "CRP" in the last 72 hours.  Lab Results  Component Value Date   SARSCOV2NAA NEGATIVE 05/28/2021   SARSCOV2NAA POSITIVE (A) 01/31/2021   [place] Path NEGATIVE 08/23/2020    CBC: Recent Labs  Lab 04/28/22 0131  WBC 11.5*  HGB 9.7*  HCT 30.4*  MCV 79.0*  PLT 526*    Cardiac Enzymes: No results for input(s): "CKTOTAL", "CKMB", "CKMBINDEX", "TROPONINI" in the last 168 hours. BNP (last 3 results) No results for input(s): "PROBNP" in the last 8760 hours. CBG: Recent Labs  Lab 05/02/22 1146 05/02/22 1430 05/02/22 1750 05/03/22 0631 05/03/22 1333  GLUCAP 134* 140* 111* 239* 121*    D-Dimer: No results for input(s): "  DDIMER" in the last 72 hours. Hgb A1c: Recent Labs    05/03/22 1219  HGBA1C 7.1*   Lipid Profile: No results for input(s): "CHOL", "HDL", "LDLCALC", "TRIG", "CHOLHDL", "LDLDIRECT" in the last 72 hours. Thyroid function studies: No results for input(s): "TSH", "T4TOTAL", "T3FREE", "THYROIDAB" in the last 72 hours.  Invalid input(s): "FREET3" Anemia work up: No results for input(s): "VITAMINB12", "FOLATE", "FERRITIN", "TIBC", "IRON", "RETICCTPCT" in the last 72 hours. Sepsis Labs: Recent Labs  Lab 04/28/22 0131  WBC 11.5*    Microbiology Recent Results (from the past 240 hour(s))  Body fluid culture w Gram Stain     Status: Abnormal   Collection Time: 04/24/22 11:37 AM   Specimen: PATH Disc; Body Fluid  Result Value Ref Range Status   Specimen Description FLUID  Final   Special Requests  DISC  Final   Gram Stain   Final    RARE WBC  PRESENT, PREDOMINANTLY PMN RARE GRAM VARIABLE ROD Performed at [facility], [address] [place], [place] [address]    Culture PSEUDOMONAS AERUGINOSA (A)  Final   Report Status 04/26/2022 FINAL  Final   Organism ID, Bacteria PSEUDOMONAS AERUGINOSA  Final      Susceptibility   Pseudomonas aeruginosa - MIC*    CEFTAZIDIME 4 SENSITIVE Sensitive     CIPROFLOXACIN <=0.25 SENSITIVE Sensitive     GENTAMICIN <=1 SENSITIVE Sensitive     IMIPENEM 1 SENSITIVE Sensitive     PIP/TAZO 8 SENSITIVE Sensitive     CEFEPIME 2 SENSITIVE Sensitive     * PSEUDOMONAS AERUGINOSA  Aerobic/Anaerobic Culture w Gram Stain (surgical/deep wound)     Status: None   Collection Time: 04/25/22 12:10 PM   Specimen: Abscess  Result Value Ref Range Status   Specimen Description ABSCESS  Final   Special Requests NONE  Final   Gram Stain   Final    FEW WBC PRESENT, PREDOMINANTLY PMN NO ORGANISMS SEEN    Culture   Final    FEW PSEUDOMONAS AERUGINOSA NO ANAEROBES ISOLATED Performed at [facility], [address], [place], [place] [address]    Report Status 04/30/2022 FINAL  Final   Organism ID, Bacteria PSEUDOMONAS AERUGINOSA  Final      Susceptibility   Pseudomonas aeruginosa - MIC*    CEFTAZIDIME 4 SENSITIVE Sensitive     CIPROFLOXACIN <=0.25 SENSITIVE Sensitive     GENTAMICIN <=1 SENSITIVE Sensitive     IMIPENEM 1 SENSITIVE Sensitive     PIP/TAZO 8 SENSITIVE Sensitive     CEFEPIME 2 SENSITIVE Sensitive     * FEW PSEUDOMONAS AERUGINOSA  Surgical pcr screen     Status: None   Collection Time: 05/02/22 10:33 AM   Specimen: Nasal Mucosa; Nasal Swab  Result Value Ref Range Status   MRSA, PCR NEGATIVE NEGATIVE Final   Staphylococcus aureus NEGATIVE NEGATIVE Final    Comment: (NOTE) The Xpert SA Assay (FDA approved for NASAL specimens in patients 67 years of age and older), is one component of a comprehensive surveillance program. It is not intended to diagnose infection nor to guide or  monitor treatment. Performed at [facility], [address], [place], [place] [address]   Aerobic/Anaerobic Culture w Gram Stain (surgical/deep wound)     Status: None (Preliminary result)   Collection Time: 05/02/22  4:41 PM   Specimen: PATH Soft tissue  Result Value Ref Range Status   Specimen Description TISSUE  Final   Special Requests SQ TISSUE  Final  Gram Stain NO WBC SEEN NO ORGANISMS SEEN   Final   Culture   Final    NO GROWTH < 12 HOURS Performed at [facility], [address], [place], [place] [address]    Report Status PENDING  Incomplete  Aerobic/Anaerobic Culture w Gram Stain (surgical/deep wound)     Status: None (Preliminary result)   Collection Time: 05/02/22  5:18 PM   Specimen: PATH Soft tissue  Result Value Ref Range Status   Specimen Description TISSUE  Final   Special Requests THORACIC 9 AND 10 LAMINECTOMY  Final   Gram Stain NO WBC SEEN NO ORGANISMS SEEN   Final   Culture   Final    NO GROWTH < 12 HOURS Performed at [facility], [address] [place], [place] [address]    Report Status PENDING  Incomplete     Medications:    baclofen  5 mg Oral TID   doxycycline  100 mg Oral Q12H   gabapentin  200 mg Oral BID   insulin aspart  0-15 Units Subcutaneous TID WC   insulin aspart  0-5 Units Subcutaneous QHS   insulin detemir  5 Units Subcutaneous BID   morphine  45 mg Oral Q12H   senna-docusate  1 tablet Oral BID   sodium chloride flush  3 mL Intravenous Q12H   Continuous Infusions:  sodium chloride Stopped (04/23/22 1631)   sodium chloride     ceFEPime (MAXIPIME) IV 2 g (05/04/22 0542)      LOS: 12 days   Marinda Elk  [facility]  05/04/2022, 8:39 AM

## 2022-05-04 NOTE — Progress Notes (Signed)
   Providing Compassionate, Quality Care - Together  NEUROSURGERY PROGRESS NOTE   S: No issues overnight.   O: EXAM:  BP 113/86 (BP Location: Right Arm)   Pulse 67   Temp 98.3 F (36.8 C) (Oral)   Resp 16   Ht 5\' 10"  (1.778 m)   Wt 74.2 kg   SpO2 98%   BMI 23.47 kg/m   Awake, alert, oriented  PERRL Speech fluent, appropriate  CNs grossly intact  MAE equally Incision c/d/i  ASSESSMENT:  56 y.o. 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  Please do not hesitate to call with questions or concerns.   58, DO Neurosurgeon [facility] Cell: [phone]

## 2022-05-04 NOTE — Progress Notes (Signed)
Peripherally Inserted Central Catheter Placement  The IV Nurse has discussed with the patient and/or persons authorized to consent for the patient, the purpose of this procedure and the potential benefits and risks involved with this procedure.  The benefits include less needle sticks, lab draws from the catheter, and the patient may be discharged home with the catheter. Risks include, but not limited to, infection, bleeding, blood clot (thrombus formation), and puncture of an artery; nerve damage and irregular heartbeat and possibility to perform a PICC exchange if needed/ordered by physician.  Alternatives to this procedure were also discussed.  Bard Power PICC patient education guide, fact sheet on infection prevention and patient information card has been provided to patient /or left at bedside.    PICC Placement Documentation  PICC Single Lumen 05/04/22 Right Brachial 36 cm 0 cm (Active)  Indication for Insertion or Continuance of Line Home intravenous therapies (PICC only) 05/04/22 1220  Exposed Catheter (cm) 0 cm 05/04/22 1220  Site Assessment Clean, Dry, Intact 05/04/22 1220  Line Status Saline locked;Blood return noted 05/04/22 1220  Dressing Type Transparent;Securing device 05/04/22 1220  Dressing Status Antimicrobial disc in place;Clean, Dry, Intact 05/04/22 1220  Safety Lock Not Applicable 05/04/22 1220  Line Care Connections checked and tightened 05/04/22 1220  Line Adjustment (NICU/IV Team Only) No 05/04/22 1220  Dressing Intervention New dressing 05/04/22 1220  Dressing Change Due 05/11/22 05/04/22 1220       Burnard Bunting Chenice 05/04/2022, 12:21 PM

## 2022-05-04 NOTE — Progress Notes (Signed)
Patient had violent outburst regarding pain regimen, patient had been waiting for 30 minutes. Patient did receive 4 doses of pain medicine prior to this violent episode. Patient became belligerent, calling out multiple profanities and calling RN explicit names. Security called. Patient advised verbal abuse will not be tolerated. Supervising RN made aware.

## 2022-05-04 NOTE — Progress Notes (Signed)
Physical Therapy Treatment Patient Details Name: John Ware MRN: [id number] DOB: 17-Mar-1966 Today's Date: 05/04/2022   History of Present Illness Pt is a 56 y/o male presenting on 8/28 with progressive back pain. Admitted with osteomyelitis. S/P T10-11 disc aspiration via laminectomy 9/7, Noted prolonged hopsitalization in 10/22 due to MSSA bacteremia and tricuspid endocarditis. PMH includes: tricuspid valve endocarditis, discitits and osteomyelitiis, DM2, chronic diastolic heart failure.    PT Comments    Pt remains limited by pain and impaired LE neuromuscular endurance. Pt demonstrates quick fatigue of LE's, with L ankle instability after multiple brief bouts of ambulation. Pt reports continued numbness in LLE>RLE, resulting in further instability when mobilizing. Pt is very cautions, deferring longer bouts of ambulation due to fear of falling. Pt will benefit from aggressive mobilization in an effort to reduce falls risk and to return to independence. PT continues to strongly recommend AIR.   Recommendations for follow up therapy are one component of a multi-disciplinary discharge planning process, led by the attending physician.  Recommendations may be updated based on patient status, additional functional criteria and insurance authorization.  Follow Up Recommendations  Acute inpatient rehab (3hours/day)     Assistance Recommended at Discharge Frequent or constant Supervision/Assistance  Patient can return home with the following A little help with bathing/dressing/bathroom;A lot of help with walking and/or transfers;Assistance with cooking/housework;Help with stairs or ramp for entrance;Assist for transportation   Equipment Recommendations  None recommended by PT    Recommendations for Other Services       Precautions / Restrictions Precautions Precautions: Back;Fall Precaution Booklet Issued: No Precaution Comments: reviewed with pt Required Braces or Orthoses:  (no brace  per MD orders) Restrictions Weight Bearing Restrictions: No     Mobility  Bed Mobility Overal bed mobility: Needs Assistance Bed Mobility: Rolling, Sidelying to Sit Rolling: Modified independent (Device/Increase time) Sidelying to sit: HOB elevated, Modified independent (Device/Increase time)       General bed mobility comments: use of rails, HOB elevated    Transfers Overall transfer level: Needs assistance Equipment used: Rolling walker (2 wheels) Transfers: Sit to/from Stand, Bed to chair/wheelchair/BSC Sit to Stand: Min assist   Step pivot transfers: Min guard            Ambulation/Gait Ambulation/Gait assistance: Min assist Gait Distance (Feet): 5 Feet (5' x 3 trials) Assistive device: Rolling walker (2 wheels) Gait Pattern/deviations: Step-to pattern, Ataxic Gait velocity: reduced Gait velocity interpretation: <1.31 ft/sec, indicative of household ambulator   General Gait Details: pt with impaired control of LLE, utilizing visual input to correct LLE deviations. PT notes L ankle instability with fatigue   Stairs             Wheelchair Mobility    Modified Rankin (Stroke Patients Only)       Balance Overall balance assessment: Needs assistance Sitting-balance support: No upper extremity supported, Feet supported Sitting balance-[name]: Fair     Standing balance support: Bilateral upper extremity supported, Reliant on assistive device for balance Standing balance-[name]: Poor                              Cognition Arousal/Alertness: Awake/alert Behavior During Therapy: Anxious Overall Cognitive Status: Within Functional Limits for tasks assessed  Exercises      General Comments General comments (skin integrity, edema, etc.): VSS on RA      Pertinent Vitals/Pain Pain Assessment Pain Assessment: Faces Faces Pain Scale: Hurts even more Pain Location:  back Pain Descriptors / Indicators: Sharp Pain Intervention(s): Monitored during session    Home Living   Living Arrangements: Spouse/significant other Available Help at Discharge: Family;Available 24 hours/day Type of Home: House Home Access: Ramped entrance       Home Layout: One level        Prior Function            PT Goals (current goals can now be found in the care plan section) Acute Rehab PT Goals Patient Stated Goal: to return to independent Progress towards PT goals: Progressing toward goals    Frequency    Min 5X/week      PT Plan Current plan remains appropriate    Co-evaluation              AM-PAC PT "6 Clicks" Mobility   Outcome Measure  Help needed turning from your back to your side while in a flat bed without using bedrails?: None Help needed moving from lying on your back to sitting on the side of a flat bed without using bedrails?: None Help needed moving to and from a bed to a chair (including a wheelchair)?: A Little Help needed standing up from a chair using your arms (e.g., wheelchair or bedside chair)?: A Little Help needed to walk in hospital room?: Total Help needed climbing 3-5 steps with a railing? : Total 6 Click Score: 16    End of Session   Activity Tolerance: Patient limited by pain Patient left: in chair;with call bell/phone within reach;with chair alarm set Nurse Communication: Mobility status PT Visit Diagnosis: Other abnormalities of gait and mobility (R26.89);Muscle weakness (generalized) (M62.81)     Time: 6067-7034 PT Time Calculation (min) (ACUTE ONLY): 24 min  Charges:  $Gait Training: 8-22 mins $Therapeutic Activity: 8-22 mins                     Arlyss Gandy, PT, DPT Acute Rehabilitation Office [phone]    Arlyss Gandy 05/04/2022, 2:26 PM

## 2022-05-05 DIAGNOSIS — I5032 Chronic diastolic (congestive) heart failure: Secondary | ICD-10-CM | POA: Diagnosis not present

## 2022-05-05 DIAGNOSIS — M4645 Discitis, unspecified, thoracolumbar region: Secondary | ICD-10-CM | POA: Diagnosis not present

## 2022-05-05 DIAGNOSIS — M4625 Osteomyelitis of vertebra, thoracolumbar region: Secondary | ICD-10-CM | POA: Diagnosis not present

## 2022-05-05 DIAGNOSIS — M869 Osteomyelitis, unspecified: Secondary | ICD-10-CM | POA: Diagnosis not present

## 2022-05-05 MED ORDER — GABAPENTIN 400 MG PO CAPS
400.0000 mg | ORAL_CAPSULE | Freq: Two times a day (BID) | ORAL | Status: DC
  Administered 2022-05-05 – 2022-05-08 (×6): 400 mg via ORAL
  Filled 2022-05-05 (×6): qty 1

## 2022-05-05 NOTE — Progress Notes (Signed)
[facility] PROGRESS NOTE    Progress Note  John Ware  BPZ:[id number] DOB: 07/05/1966 DOA: 04/22/2022 PCP: Junie Spencer, FNP     Brief Narrative:   John Ware is an 56 y.o. male past medical history of tricuspid valve endocarditis, discitis and osteomyelitis, diabetes mellitus type 2, chronic diastolic heart failure comes in for progressive back pain was discharged home on 06/11/2021 after prolonged hospitalization for MSSA bacteremia and tricuspid endocarditis.  Underwent T10-T11 disc aspiration by IR, who grew Pseudomonas he is currently on vancomycin and cefepime.  ID recommended Doxy and cefepime for minimum of 6 weeks postoperatively if surgery is performed followed by lifelong doxycycline and ciprofloxacin. Neurosurgery recommended surgical intervention with  decompression and debridement of abscess via laminectomy of the T10 to T11 performed on 05/03/2022.   Assessment/Plan:   Osteomyelitis of vertebra of thoracolumbar region [facility]) He will continue doxycycline and cefepime for 6 weeks post surgical intervention, followed by suppressive therapy Doxy and Cipro. PT evaluated the patient and recommended inpatient rehab. PICC line to be inserted 05/04/2022. His pain is not controlled increase Neurontin. Awaiting insurance approval to transfer to inpatient rehab Patient relates he is having regular bowel movements. He continues to have pain increase Neurontin relates the pain burning-like  Diabetes mellitus type 2: A1c 7.1, he was started on dexamethasone blood glucose has become erratic. Currently on sliding scale insulin blood glucose improved.  Chronic diastolic heart failure: Appears euvolemic continue current medication.  Chronic pain syndrome: Continue baclofen MS Contin and oxycodone. Continue IV morphine for breakthrough pain.  Skin rash: Improved with hydroxyzine.  Stage I sacral decubitus ulcer of the left buttock RN Pressure Injury  Documentation: Pressure Injury 06/11/21 Buttocks Left Stage 1 -  Intact skin with non-blanchable redness of a localized area usually over a bony prominence. (Active)  06/11/21 1617  Location: Buttocks  Location Orientation: Left  Staging: Stage 1 -  Intact skin with non-blanchable redness of a localized area usually over a bony prominence.  Wound Description (Comments):   Present on Admission: Yes     Pressure Injury 06/11/21 Buttocks Right Stage 1 -  Intact skin with non-blanchable redness of a localized area usually over a bony prominence. (Active)  06/11/21 1618  Location: Buttocks  Location Orientation: Right  Staging: Stage 1 -  Intact skin with non-blanchable redness of a localized area usually over a bony prominence.  Wound Description (Comments):   Present on Admission: Yes    Estimated body mass index is 24.48 kg/m as calculated from the following:   Height as of this encounter: 5\' 10"  (1.778 m).   Weight as of this encounter: 77.4 kg.  DVT prophylaxis: lovenox Family Communication:Wife Status is: Inpatient Remains inpatient appropriate because: Osteomyelitis and abscess of the back    Code Status:     Code Status Orders  (From admission, onward)           Start     Ordered   04/22/22 2233  Full code  Continuous        04/22/22 2233           Code Status History     Date Active Date Inactive Code Status Order ID Comments User Context   06/11/2021 1552 06/26/2021 1813 Full Code [id number]  Jerene Pitch Inpatient   05/28/2021 1925 06/11/2021 1538 Full Code [id number]  Coralie Keens, MD ED   01/31/2021 1453 03/15/2021 2102 Full Code [id number]  Teddy Spike, DO Inpatient  10/14/2017 1803 10/15/2017 1738 Full Code [id number]  Kristeen Miss, MD Inpatient   12/13/2014 1654 12/14/2014 1323 Full Code [id number]  Kristeen Miss, MD Inpatient         IV Access:   Peripheral IV   Procedures and diagnostic studies:   [place] EKG SITE RITE  Result  Date: 05/03/2022 If Site Rite image not attached, placement could not be confirmed due to current cardiac rhythm.    Medical Consultants:   None.   Subjective:    John Ware relates his pain is improved this morning.  Objective:    Vitals:   05/05/22 0004 05/05/22 0310 05/05/22 0500 05/05/22 0905  BP: 113/84 138/88  127/80  Pulse: 93 85  66  Resp: 18 18  18   Temp: 98.3 F (36.8 C) 98.6 F (37 C)  98 F (36.7 C)  TempSrc: Oral Oral  Oral  SpO2: 98% 99%  96%  Weight:   77.4 kg   Height:       SpO2: 96 % O2 Flow Rate (L/min): 2 L/min   Intake/Output Summary (Last 24 hours) at 05/05/2022 0940 Last data filed at 05/05/2022 0634 Gross per 24 hour  Intake --  Output 2630 ml  Net -2630 ml    Filed Weights   04/24/22 0423 05/02/22 0500 05/05/22 0500  Weight: 76.7 kg 74.2 kg 77.4 kg    Exam: General exam: In no acute distress. Respiratory system: Good air movement and clear to auscultation. Cardiovascular system: S1 & S2 heard, RRR. No JVD. Gastrointestinal system: Abdomen is nondistended, soft and nontender.  Extremities: No pedal edema. Skin: No rashes, lesions or ulcers Psychiatry: Judgement and insight appear normal. Mood & affect appropriate.  Data Reviewed:    Labs: Basic Metabolic Panel: Recent Labs  Lab 04/30/22 0420  NA 136  K 4.2  CL 103  CO2 24  GLUCOSE 117*  BUN 10  CREATININE 0.64  CALCIUM 9.2    GFR Estimated Creatinine Clearance: 109 mL/min (by C-G formula based on SCr of 0.64 mg/dL). Liver Function Tests: No results for input(s): "AST", "ALT", "ALKPHOS", "BILITOT", "PROT", "ALBUMIN" in the last 168 hours. No results for input(s): "LIPASE", "AMYLASE" in the last 168 hours. No results for input(s): "AMMONIA" in the last 168 hours. Coagulation profile No results for input(s): "INR", "PROTIME" in the last 168 hours. COVID-19 Labs  No results for input(s): "DDIMER", "FERRITIN", "LDH", "CRP" in the last 72 hours.  Lab Results   Component Value Date   SARSCOV2NAA NEGATIVE 05/28/2021   SARSCOV2NAA POSITIVE (A) 01/31/2021   [place] NEGATIVE 08/23/2020    CBC: No results for input(s): "WBC", "NEUTROABS", "HGB", "HCT", "MCV", "PLT" in the last 168 hours.  Cardiac Enzymes: No results for input(s): "CKTOTAL", "CKMB", "CKMBINDEX", "TROPONINI" in the last 168 hours. BNP (last 3 results) No results for input(s): "PROBNP" in the last 8760 hours. CBG: Recent Labs  Lab 05/02/22 1146 05/02/22 1430 05/02/22 1750 05/03/22 0631 05/03/22 1333  GLUCAP 134* 140* 111* 239* 121*    D-Dimer: No results for input(s): "DDIMER" in the last 72 hours. Hgb A1c: Recent Labs    05/03/22 1219  HGBA1C 7.1*    Lipid Profile: No results for input(s): "CHOL", "HDL", "LDLCALC", "TRIG", "CHOLHDL", "LDLDIRECT" in the last 72 hours. Thyroid function studies: No results for input(s): "TSH", "T4TOTAL", "T3FREE", "THYROIDAB" in the last 72 hours.  Invalid input(s): "FREET3" Anemia work up: No results for input(s): "VITAMINB12", "FOLATE", "FERRITIN", "TIBC", "IRON", "RETICCTPCT" in the last 72 hours. Sepsis Labs: No results  for input(s): "PROCALCITON", "WBC", "LATICACIDVEN" in the last 168 hours.  Microbiology Recent Results (from the past 240 hour(s))  Aerobic/Anaerobic Culture w Gram Stain (surgical/deep wound)     Status: None   Collection Time: 04/25/22 12:10 PM   Specimen: Abscess  Result Value Ref Range Status   Specimen Description ABSCESS  Final   Special Requests NONE  Final   Gram Stain   Final    FEW WBC PRESENT, PREDOMINANTLY PMN NO ORGANISMS SEEN    Culture   Final    FEW PSEUDOMONAS AERUGINOSA NO ANAEROBES ISOLATED Performed at [facility], [address], [place], [place] [address]    Report Status 04/30/2022 FINAL  Final   Organism ID, Bacteria PSEUDOMONAS AERUGINOSA  Final      Susceptibility   Pseudomonas aeruginosa - MIC*    CEFTAZIDIME 4 SENSITIVE Sensitive     CIPROFLOXACIN <=0.25  SENSITIVE Sensitive     GENTAMICIN <=1 SENSITIVE Sensitive     IMIPENEM 1 SENSITIVE Sensitive     PIP/TAZO 8 SENSITIVE Sensitive     CEFEPIME 2 SENSITIVE Sensitive     * FEW PSEUDOMONAS AERUGINOSA  Surgical pcr screen     Status: None   Collection Time: 05/02/22 10:33 AM   Specimen: Nasal Mucosa; Nasal Swab  Result Value Ref Range Status   MRSA, PCR NEGATIVE NEGATIVE Final   Staphylococcus aureus NEGATIVE NEGATIVE Final    Comment: (NOTE) The Xpert SA Assay (FDA approved for NASAL specimens in patients 64 years of age and older), is one component of a comprehensive surveillance program. It is not intended to diagnose infection nor to guide or monitor treatment. Performed at [facility], [address] [place], [place] [address]   Aerobic/Anaerobic Culture w Gram Stain (surgical/deep wound)     Status: None (Preliminary result)   Collection Time: 05/02/22  4:41 PM   Specimen: PATH Soft tissue  Result Value Ref Range Status   Specimen Description TISSUE  Final   Special Requests SQ TISSUE  Final   Gram Stain NO WBC SEEN NO ORGANISMS SEEN   Final   Culture   Final    NO GROWTH 2 DAYS NO ANAEROBES ISOLATED; CULTURE IN PROGRESS FOR 5 DAYS Performed at [facility], [address], [place], [place] [address]    Report Status PENDING  Incomplete  Aerobic/Anaerobic Culture w Gram Stain (surgical/deep wound)     Status: None (Preliminary result)   Collection Time: 05/02/22  5:18 PM   Specimen: PATH Soft tissue  Result Value Ref Range Status   Specimen Description TISSUE  Final   Special Requests THORACIC 9 AND 10 LAMINECTOMY  Final   Gram Stain NO WBC SEEN NO ORGANISMS SEEN   Final   Culture   Final    NO GROWTH 2 DAYS NO ANAEROBES ISOLATED; CULTURE IN PROGRESS FOR 5 DAYS Performed at [facility], [address], [place], [place] [address]    Report Status PENDING  Incomplete     Medications:    baclofen  5 mg Oral TID   Chlorhexidine Gluconate Cloth   6 each Topical Daily   doxycycline  100 mg Oral Q12H   gabapentin  200 mg Oral BID   insulin aspart  0-15 Units Subcutaneous TID WC   insulin aspart  0-5 Units Subcutaneous QHS   insulin detemir  5 Units Subcutaneous BID   morphine  45 mg Oral Q12H   senna-docusate  1 tablet Oral BID  sodium chloride flush  10-40 mL Intracatheter Q12H   sodium chloride flush  3 mL Intravenous Q12H   Continuous Infusions:  sodium chloride Stopped (04/23/22 1631)   sodium chloride     ceFEPime (MAXIPIME) IV 2 g (05/05/22 0634)      LOS: 13 days   Marinda Elk  [facility]  05/05/2022, 9:40 AM

## 2022-05-05 NOTE — Progress Notes (Signed)
Patient refused fingerstick blood sugar check and scheduled insulin doses. Attempted to explain important of blood sugar check and scheduled insulin, but pt refused to listen . Oncall provider is notified.

## 2022-05-05 NOTE — Progress Notes (Addendum)
Inpatient Rehab Admissions Coordinator:  Continue to be unable to contact wife. Voicemail is full. Spoke with pt and he said he would give wife my number and tell her to call me. Will continue to follow.  1316: Wife called AC. Discussed CIR expectations, visitation policy, and insurance authorization policy. She acknowledged understanding. She is supportive of pt pursuing CIR. She confirmed that she will be able to provide 24/7 support for pt after discharge.   Wolfgang Phoenix, MS, CCC-SLP Admissions Coordinator [phone]

## 2022-05-05 NOTE — Progress Notes (Signed)
Occupational Therapy Treatment Patient Details Name: John Ware MRN: [id number] DOB: Jan 21, 1966 Today's Date: 05/05/2022   History of present illness Pt is a 56 y/o male presenting on 8/28 with progressive back pain. Admitted with osteomyelitis. S/P T10-11 disc aspiration via laminectomy 9/7, Noted prolonged hopsitalization in 10/22 due to MSSA bacteremia and tricuspid endocarditis. PMH includes: tricuspid valve endocarditis, discitits and osteomyelitiis, DM2, chronic diastolic heart failure.   OT comments  Pt progressing towards goals this session, able to complete seated UB ADLs at sink with set up -minA, pt mod I for bed mobility, and min A for transfers with RW. Pt reporting 8/10 pain, and heavy reliance on RW when walking short distance to sink. Pt able to recall back precautions and demo log roll technique well to get OOB. Pt presenting with impairments listed below, will follow acutely. Continue to recommend AIR at d/c.   Recommendations for follow up therapy are one component of a multi-disciplinary discharge planning process, led by the attending physician.  Recommendations may be updated based on patient status, additional functional criteria and insurance authorization.    Follow Up Recommendations  Acute inpatient rehab (3hours/day)    Assistance Recommended at Discharge Set up Supervision/Assistance  Patient can return home with the following  A lot of help with bathing/dressing/bathroom;Assistance with cooking/housework;Assist for transportation;Help with stairs or ramp for entrance   Equipment Recommendations  Other (comment) (defer)    Recommendations for Other Services PT consult;Rehab consult    Precautions / Restrictions Precautions Precautions: Back;Fall Precaution Booklet Issued: No Precaution Comments: reviewed with pt Required Braces or Orthoses:  (no brace per MD order) Restrictions Weight Bearing Restrictions: No       Mobility Bed Mobility Overal  bed mobility: Needs Assistance Bed Mobility: Sidelying to Sit   Sidelying to sit: HOB elevated, Modified independent (Device/Increase time)            Transfers Overall transfer level: Needs assistance Equipment used: Rolling walker (2 wheels) Transfers: Sit to/from Stand Sit to Stand: Min assist                 Balance Overall balance assessment: Needs assistance Sitting-balance support: No upper extremity supported, Feet supported Sitting balance-[name]: Good     Standing balance support: Bilateral upper extremity supported, Reliant on assistive device for balance Standing balance-[name]: Poor Standing balance comment: reliant on external support                           ADL either performed or assessed with clinical judgement   ADL Overall ADL's : Needs assistance/impaired     Grooming: Set up;Sitting Grooming Details (indicate cue type and reason): seated at sink Upper Body Bathing: Set up Upper Body Bathing Details (indicate cue type and reason): seated at sink             Toilet Transfer: Minimal assistance;Ambulation;BSC/3in1 Statistician Details (indicate cue type and reason): simulated via functional mobility in room         Functional mobility during ADLs: Min guard      Extremity/Trunk Assessment Upper Extremity Assessment Upper Extremity Assessment: Defer to OT evaluation LUE Deficits / Details: limited shoulder flexion to approx 90*, distally WFL LUE Sensation: history of peripheral neuropathy LUE Coordination: decreased gross motor   Lower Extremity Assessment Lower Extremity Assessment: Defer to PT evaluation        Vision   Vision Assessment?: No apparent visual deficits   Perception  Perception Perception: Not tested   Praxis Praxis Praxis: Not tested    Cognition Arousal/Alertness: Awake/alert Behavior During Therapy: Anxious Overall Cognitive Status: Within Functional Limits for tasks  assessed                                          Exercises      Shoulder Instructions       General Comments VSS on RA    Pertinent Vitals/ Pain       Pain Assessment Pain Assessment: Faces Pain Score: 8  Faces Pain Scale: Hurts whole lot Pain Location: back Pain Descriptors / Indicators: Sharp Pain Intervention(s): Limited activity within patient's tolerance, Monitored during session, Repositioned  Home Living                                          Prior Functioning/Environment              Frequency  Min 2X/week        Progress Toward Goals  OT Goals(current goals can now be found in the care plan section)  Progress towards OT goals: Progressing toward goals  Acute Rehab OT Goals Patient Stated Goal: to get to rehab OT Goal Formulation: With patient Time For Goal Achievement: 05/17/22 Potential to Achieve Goals: Good ADL Goals Pt Will Perform Grooming: with modified independence;sitting;standing Pt Will Perform Lower Body Dressing: with modified independence;sit to/from stand;sitting/lateral leans;with adaptive equipment Pt Will Transfer to Toilet: with modified independence;ambulating;stand pivot transfer;bedside commode Pt Will Perform Toileting - Clothing Manipulation and hygiene: with modified independence;sit to/from stand;sitting/lateral leans  Plan Discharge plan remains appropriate;Frequency remains appropriate    Co-evaluation                 AM-PAC OT "6 Clicks" Daily Activity     Outcome Measure   Help from another person eating meals?: None Help from another person taking care of personal grooming?: A Little Help from another person toileting, which includes using toliet, bedpan, or urinal?: A Lot Help from another person bathing (including washing, rinsing, drying)?: A Lot Help from another person to put on and taking off regular upper body clothing?: A Little Help from another person to put  on and taking off regular lower body clothing?: A Lot 6 Click Score: 16    End of Session Equipment Utilized During Treatment: Gait belt;Rolling walker (2 wheels)  OT Visit Diagnosis: Other abnormalities of gait and mobility (R26.89);Muscle weakness (generalized) (M62.81);Pain   Activity Tolerance Patient tolerated treatment well   Patient Left in bed;with call bell/phone within reach (seated EOB; pt adamantly declining bed alarm, RN notified)   Nurse Communication Mobility status        Time: 5993-5701 OT Time Calculation (min): 20 min  Charges: OT General Charges $OT Visit: 1 Visit OT Treatments $Self Care/Home Management : 8-22 mins  Alfonzo Beers, OTD, OTR/L Acute Rehab [phone]   Mayer Masker 05/05/2022, 3:45 PM

## 2022-05-05 NOTE — Progress Notes (Signed)
  NEUROSURGERY PROGRESS NOTE   No issues overnight. Pt reports unchanged back soreness. No new complaints.  EXAM:  BP 127/80 (BP Location: Left Arm)   Pulse 66   Temp 98 F (36.7 C) (Oral)   Resp 18   Ht 5\' 10"  (1.778 m)   Wt 77.4 kg   SpO2 96%   BMI 24.48 kg/m   Awake, alert, oriented  Speech fluent, appropriate  CN grossly intact  5/5 BUE/BLE   IMPRESSION:  56 y.o. male POD#3 s/p thoracic laminectomy for SEA, neurologically stable  PLAN: - Cont mobilization with therapy - Cont IV abx - Will likely need CIR upon d/c.   57, MD [facility]

## 2022-05-05 NOTE — PMR Pre-admission (Signed)
PMR Admission Coordinator Pre-Admission Assessment  Patient: John Ware is an 56 y.o., male MRN: [id number] DOB: 12-May-1966 Height: 5\' 10"  (177.8 cm) Weight: 77.4 kg  Insurance Information HMO:     PPO: yes     PCP:      IPA:      80/20:      OTHER:  PRIMARY: [employer]      Policy#:      Subscriber: patient CM Name: ***      Phone#: ***     Fax#: *** Pre-Cert#: ***      Employer: *** Benefits:  Phone #: ***     Name: *** [id number]. Date: ***     Deduct: ***      Out of Pocket Max: ***      Life Max: *** CIR: ***      SNF: *** Outpatient: ***     Co-Pay: *** Home Health: ***      Co-Pay: *** DME: ***     Co-Pay: *** Providers: in-network SECONDARY:       Policy#:      Phone#:   Financial Counselor:       Phone#:   [facility]" for patients in Inpatient Rehabilitation Facilities with attached "Privacy Act Statement-Health Care Records" was provided and verbally reviewed with: N/A  Emergency Contact Information Contact Information     Name Relation Home Work Mobile   Dodson Spouse [phone]  [phone]   Redford,Francis Mother [phone]     [phone] Niece   [phone]       Current Medical History  Patient Admitting Diagnosis: Osteomyelitis/discitis at T11-12 interspace; s/p decompression and debridement of abscess via T10-11 laminectomy History of Present Illness: Pt is a 56 year old male with medical hx significant for: DM II, chronic diastolic heart failure, HTN, spinal stenosis, depression, osteomyelitis, epidural abscess, infective myositis, discitis of thoracolumbar region, cervical spondylosis with myelopathy, MRSA infection, MSSA infection, psoas abscess, chest wall abscess, endocarditis, ascending aorta dilatation, lumbar stenosis with neurogenic claudication, transient weakness of RLE, total shoulder arthroplasty, lumbar laminectomy/decompression microdiscectomy, I&D of shoulder, I&D of multiple abscesses. Pt  presented to [facility] on 04/22/22 d/t inability walk, bilateral leg numbness, urinary incontinence/urgency x3 weeks, difficulty completing ADLs.  MRI lumbar spine on 8/28 showed 1) acute osteomyelitis discitis at T11-12 interspace. Associtaed epidural phelgmon/abscess extending from lower thoracic spine to ~level of T12-L1 resulting in mild spinal stenosis at level of T11-12 without frank cord compression 2) Soft tissue abscess within subcutaneous fat of left back at level of T12 3) subacute to chronic changes of osteomyelitis discitis at L1-2 and L2-3. Neurosurgery consulted. IV antibiotics started. MRI thoracic spine on 8/29 showed osteomyelitis is confined to levels of T10-T11 and T12. Moderate central canal stenosis secondary to phlegmon in this region. Pt underwent T10-11 disc aspiration by Dr. 9/29 on 04/24/22. Aspirated culture yielded Pseudomonas aeruginosa. Cefepime and vancomycin initiated. Pt's vancomycin changed to doxycyline on 9/6 to reduce amount of IV antibiotics. Repeat MRI thoracic spine on 9/6 showed persistent osteomyelitis with abnormal signal to T9-T12. Neurosurgery recommended decompression of lower thoracic spine and evacuation of pus; pt underwent procedure by Dr. 11/6 on 05/02/22. Pt to continue cefepime IV and oral doxycycline x 6 weeks and then switch to ciprofloxacin and doxycycline chronically. Therapy evaluations completed and CIR recommended d/t pt's deficits in functional mobility and inability to complete ADLs independently.     Patient's medical record from [facility] has been reviewed  by the rehabilitation admission coordinator and physician.  Past Medical History  Past Medical History:  Diagnosis Date   Depression    Diabetes mellitus without complication (HCC)    Hypertension    Spinal stenosis    With Neurogenic Claudication    Has the patient had major surgery during 100 days prior to admission? Yes  Family History   family  history includes Cancer in his mother.  Current Medications  Current Facility-Administered Medications:    0.9 %  sodium chloride infusion, , Intravenous, PRN, [name], MD, Stopped at 04/23/22 1631   acetaminophen (TYLENOL) tablet 650 mg, 650 mg, Oral, Q6H PRN, 650 mg at 05/02/22 2204 **OR** acetaminophen (TYLENOL) suppository 650 mg, 650 mg, Rectal, Q6H PRN, [name], MD   alum & mag hydroxide-simeth (MAALOX/MYLANTA) 200-200-20 MG/5ML suspension 30 mL, 30 mL, Oral, Q6H PRN, [name], MD   baclofen (LIORESAL) tablet 5 mg, 5 mg, Oral, TID, [name], MD, 5 mg at 05/05/22 0857   bisacodyl (DULCOLAX) suppository 10 mg, 10 mg, Rectal, Daily PRN, [name], MD   ceFEPIme (MAXIPIME) 2 g in sodium chloride 0.9 % 100 mL IVPB, 2 g, Intravenous, Q8H, [name], MD, Last Rate: 200 mL/hr at 05/05/22 0634, 2 g at 05/05/22 7829   Chlorhexidine Gluconate Cloth 2 % PADS 6 each, 6 each, Topical, Daily, Marinda Elk, MD, 6 each at 05/05/22 1140   doxycycline (VIBRA-TABS) tablet 100 mg, 100 mg, Oral, Q12H, [name], MD, 100 mg at 05/05/22 0858   gabapentin (NEURONTIN) capsule 400 mg, 400 mg, Oral, BID, Marinda Elk, MD   hydrOXYzine (ATARAX) tablet 10 mg, 10 mg, Oral, TID PRN, [name], MD, 10 mg at 05/05/22 1139   insulin aspart (novoLOG) injection 0-15 Units, 0-15 Units, Subcutaneous, TID WC, Marinda Elk, MD   insulin aspart (novoLOG) injection 0-5 Units, 0-5 Units, Subcutaneous, QHS, Marinda Elk, MD   insulin detemir (LEVEMIR) injection 5 Units, 5 Units, Subcutaneous, BID, Marinda Elk, MD   menthol-cetylpyridinium (CEPACOL) lozenge 3 mg, 1 lozenge, Oral, PRN **OR** phenol (CHLORASEPTIC) mouth spray 1 spray, 1 spray, Mouth/Throat, PRN, [name], MD   morphine (MS CONTIN) 12 hr tablet 45 mg, 45 mg, Oral, Q12H, [name], MD, 45 mg at 05/05/22 1129   morphine (PF) 2 MG/ML injection 2-4 mg, 2-4 mg, Intravenous, Q2H PRN, [name], MD, 4 mg at 05/05/22 1301   naloxone (NARCAN) injection 0.4 mg, 0.4 mg, Intravenous, PRN, [name], MD   ondansetron (ZOFRAN) tablet 4 mg, 4 mg, Oral, Q6H PRN **OR** ondansetron (ZOFRAN) injection 4 mg, 4 mg, Intravenous, Q6H PRN, [name], MD   oxyCODONE-acetaminophen (PERCOCET/ROXICET) 5-325 MG per tablet 2 tablet, 2 tablet, Oral, Q6H PRN, [name], MD, 2 tablet at 05/05/22 0900   sodium chloride flush (NS) 0.9 % injection 10-40 mL, 10-40 mL, Intracatheter, Q12H, Marinda Elk, MD, 10 mL at 05/05/22 1132  Patients Current Diet:  Diet Order             Diet regular Room service appropriate? Yes; Fluid consistency: Thin  Diet effective now                   Precautions / Restrictions Precautions Precautions: Back, Fall Precaution Booklet Issued: No Precaution Comments: reviewed with pt Restrictions Weight Bearing Restrictions: No   Has the patient had 2 or more falls or a fall with injury in the past year? Yes  Prior Activity Level Community (5-7x/wk): everyday, driving  Prior Functional Level Self Care: Did the patient need help bathing, dressing, using the toilet or eating? Independent  Indoor Mobility: Did the patient need assistance with walking from room to room (with or without device)? Independent  Stairs: Did the patient need assistance with internal or external stairs (with or without device)? Independent  Functional Cognition: Did the patient need help planning regular tasks such as shopping or remembering to take medications? Independent  Patient Information Are you of Hispanic, Latino/a,or Spanish origin?: A. No, not of Hispanic, Latino/a, or Spanish origin What is your race?: A. White Do you need or want an interpreter to communicate with a doctor or health care staff?: 0. No  Patient's Response To:  Health Literacy and Transportation Is the patient able to respond to health literacy and transportation needs?: Yes Health  Literacy - How often do you need to have someone help you when you read instructions, pamphlets, or other written material from your doctor or pharmacy?: Never In the past 12 months, has lack of transportation kept you from medical appointments or from getting medications?: No In the past 12 months, has lack of transportation kept you from meetings, work, or from getting things needed for daily living?: No  Home Assistive Devices / Equipment Home Assistive Devices/Equipment: Environmental consultantWalker (specify type), Shower chair with back, Wheelchair Home Equipment: Shower seat, Agricultural consultantolling Walker (2 wheels), [employer] ServiceMaster CompanyCane - single point, Wheelchair - manual, Other (comment)  Prior Device Use: Indicate devices/aids used by the patient prior to current illness, exacerbation or injury? None of the above. Pt used wheelchair during 3 week decline prior to hospital admission.  Current Functional Level Cognition  Overall Cognitive Status: Within Functional Limits for tasks assessed Orientation Level: Oriented X4 General Comments: pts cognition appears WFL, good recall of needs from prior admission (last year) reports recalling what OT has taught him and therefore not needing OT--diffulty understanding the physical component of acutally completing the tasks.    Extremity Assessment (includes Sensation/Coordination)  Upper Extremity Assessment: Defer to OT evaluation LUE Deficits / Details: limited shoulder flexion to approx 90*, distally WFL LUE Sensation: history of peripheral neuropathy LUE Coordination: decreased gross motor  Lower Extremity Assessment: RLE deficits/detail, LLE deficits/detail RLE Deficits / Details: AROM WFL, strength hip flexion 3-/5, knee extension 3+/5, ankle DF at least 3/5; numbness throughout LE, but hypersensitivity around trunk since surgery RLE Sensation: decreased light touch LLE Deficits / Details: AROM WFL, strength hip flexion 3/5, knee extension 4-/5, ankle DF at least 3/5; numbness throughout  LE worse than R, but hypersensitivity around trunk since surgery LLE Sensation: decreased light touch    ADLs  Overall ADL's : Needs assistance/impaired Grooming: Set up, Sitting Upper Body Dressing : Set up, Sitting Lower Body Dressing: Maximal assistance, Sitting/lateral leans Lower Body Dressing Details (indicate cue type and reason): simulated EOB Toilet Transfer: Min Pension scheme managerguard Toilet Transfer Details (indicate cue type and reason): simulated lateral scoots to [facility] Functional mobility during ADLs: Min guard General ADL Comments: limited to EOB, pt declines standing or transferring today    Mobility  Overal bed mobility: Needs Assistance Bed Mobility: Rolling, Sidelying to Sit Rolling: Modified independent (Device/Increase time) Sidelying to sit: HOB elevated, Modified independent (Device/Increase time) Sit to sidelying: Supervision General bed mobility comments: use of rails, HOB elevated    Transfers  Overall transfer level: Needs assistance Equipment used: Rolling walker (2 wheels) Transfers: Sit to/from Stand, Bed to chair/wheelchair/BSC Sit to Stand: Min assist Bed to/from chair/wheelchair/BSC transfer type:: Step pivot Step pivot transfers:  Min guard  Lateral/Scoot Transfers: Min guard General transfer comment: lateral scoot to HOB (minimally)prior to returning supine with min guard for safety, but declined attempts to stand    Ambulation / Gait / Stairs / Wheelchair Mobility  Ambulation/Gait Ambulation/Gait assistance: Editor, commissioning (Feet): 5 Feet (5' x 3 trials) Assistive device: Rolling walker (2 wheels) Gait Pattern/deviations: Step-to pattern, Ataxic General Gait Details: pt with impaired control of LLE, utilizing visual input to correct LLE deviations. PT notes L ankle instability with fatigue Gait velocity: reduced Gait velocity interpretation: <1.31 ft/sec, indicative of household ambulator    Posture / Balance Dynamic Sitting Balance Sitting balance  - Comments: preference to 1 UE support dynamically, but weak postural muscles noted with posterior pelvic tilt, rounded spine and forward head and shoulders and preferencing UE support Balance Overall balance assessment: Needs assistance Sitting-balance support: No upper extremity supported, Feet supported Sitting balance-[name]: Fair Sitting balance - Comments: preference to 1 UE support dynamically, but weak postural muscles noted with posterior pelvic tilt, rounded spine and forward head and shoulders and preferencing UE support Standing balance support: Bilateral upper extremity supported, Reliant on assistive device for balance Standing balance-[name]: Poor Standing balance comment: NT    Special needs/care consideration Continuous Drip IV  0.9% sodium chloride infusion, Skin Surgical incision: back; Rash: buttocks, leg/right, left, bilateral, and Diabetic management Levemir: 5 units 2x/day; novoLOG 0-5 units daily at bedtime; novoLOG 0-15 units 3x daily at meals   Previous Home Environment (from acute therapy documentation) Living Arrangements: Spouse/significant other  Lives With: Spouse Available Help at Discharge: Family, Available 24 hours/day Type of Home: House Home Layout: One level Home Access: Ramped entrance Bathroom Shower/Tub: Health visitor: Handicapped height Bathroom Accessibility: Yes How Accessible: Accessible via walker Home Care Services: No  Discharge Living Setting Plans for Discharge Living Setting: Patient's home Type of Home at Discharge: House Discharge Home Layout: One level Discharge Home Access: Ramped entrance Discharge Bathroom Shower/Tub: Walk-in shower Discharge Bathroom Toilet: Handicapped height Discharge Bathroom Accessibility: Yes How Accessible: Accessible via walker Does the patient have any problems obtaining your medications?: No  Social/Family/Support Systems Anticipated Caregiver: Powell Halbert,  wife Anticipated Caregiver's Contact Information: [phone] Caregiver Availability: 24/7 Discharge Plan Discussed with Primary Caregiver: Yes Is Caregiver In Agreement with Plan?: Yes Does Caregiver/Family have Issues with Lodging/Transportation while Pt is in Rehab?: No  Goals Patient/Family Goal for Rehab: *** Expected length of stay: *** Pt/Family Agrees to Admission and willing to participate: Yes Program Orientation Provided & Reviewed with Pt/Caregiver Including Roles  & Responsibilities: Yes  Decrease burden of Care through IP rehab admission: NA  Possible need for SNF placement upon discharge: Not anticipated  Patient Condition: I have reviewed medical records from [facility], spoken with {CHL IP CSW YP:[id number]}, and {CHL IP Patient Spouse Son Daughter Family Member:[id number]}. I {CHL IP at bedside or phone:304550005} for inpatient rehabilitation assessment.  Patient will benefit from ongoing {CHL IP PT OT IWP:[id number]}, can actively participate in 3 hours of therapy a day 5 days of the week, and can make measurable gains during the admission.  Patient will also benefit from the coordinated team approach during an Inpatient Acute Rehabilitation admission.  The patient will receive intensive therapy as well as Rehabilitation physician, nursing, social worker, and care management interventions.  Due to safety, skin/wound care, disease management, medication administration, pain management, and patient education the patient requires 24 hour a day rehabilitation nursing.  The patient is currently *** with  mobility and basic ADLs.  Discharge setting and therapy post discharge at [facility] IP discharge location:[id number]} is anticipated.  Patient has agreed to participate in the Acute Inpatient Rehabilitation Program and will admit {Time; today/tomorrow:10263}.  Preadmission Screen Completed By:  Domingo Pulse, 05/05/2022 1:21  PM ______________________________________________________________________   Discussed status with Dr. Marland Kitchen on *** at *** and received approval for admission today.  Admission Coordinator:  Domingo Pulse, CCC-SLP, time ***/Date ***   Assessment/Plan: Diagnosis: Does the need for close, 24 hr/day Medical supervision in concert with the patient's rehab needs make it unreasonable for this patient to be served in a less intensive setting? {yes_no_potentially:3041433} Co-Morbidities requiring supervision/potential complications: *** Due to {due GY:6948546}, does the patient require 24 hr/day rehab nursing? {yes_no_potentially:3041433} Does the patient require coordinated care of a physician, rehab nurse, PT, OT, and SLP to address physical and functional deficits in the context of the above medical diagnosis(es)? {yes_no_potentially:3041433} Addressing deficits in the following areas: {deficits:3041436} Can the patient actively participate in an intensive therapy program of at least 3 hrs of therapy 5 days a week? {yes_no_potentially:3041433} The potential for patient to make measurable gains while on inpatient rehab is {potential:3041437} Anticipated functional outcomes upon discharge from inpatient rehab: {functional outcomes:304600100} PT, {functional outcomes:304600100} OT, {functional outcomes:304600100} SLP Estimated rehab length of stay to reach the above functional goals is: *** Anticipated discharge destination: {anticipated dc setting:21604} 10. Overall Rehab/Functional Prognosis: {potential:3041437}   MD Signature: ***

## 2022-05-06 DIAGNOSIS — M4625 Osteomyelitis of vertebra, thoracolumbar region: Secondary | ICD-10-CM | POA: Diagnosis not present

## 2022-05-06 DIAGNOSIS — M8608 Acute hematogenous osteomyelitis, other sites: Secondary | ICD-10-CM | POA: Diagnosis not present

## 2022-05-06 DIAGNOSIS — M869 Osteomyelitis, unspecified: Secondary | ICD-10-CM | POA: Diagnosis not present

## 2022-05-06 DIAGNOSIS — I5032 Chronic diastolic (congestive) heart failure: Secondary | ICD-10-CM | POA: Diagnosis not present

## 2022-05-06 DIAGNOSIS — M4645 Discitis, unspecified, thoracolumbar region: Secondary | ICD-10-CM | POA: Diagnosis not present

## 2022-05-06 MED ORDER — CEFEPIME IV (FOR PTA / DISCHARGE USE ONLY): 2.0000 g | Freq: Three times a day (TID) | INTRAVENOUS | 0 refills | Status: DC

## 2022-05-06 NOTE — Plan of Care (Signed)
  Problem: Education: Goal: Ability to describe self-care measures that may prevent or decrease complications (Diabetes Survival Skills Education) will improve Outcome: Progressing Goal: Individualized Educational Video(s) Outcome: Progressing   Problem: Coping: Goal: Ability to adjust to condition or change in health will improve Outcome: Progressing   Problem: Fluid Volume: Goal: Ability to maintain a balanced intake and output will improve Outcome: Progressing   Problem: Health Behavior/Discharge Planning: Goal: Ability to identify and utilize available resources and services will improve Outcome: Progressing Goal: Ability to manage health-related needs will improve Outcome: Progressing   Problem: Metabolic: Goal: Ability to maintain appropriate glucose levels will improve Outcome: Progressing   Problem: Nutritional: Goal: Maintenance of adequate nutrition will improve Outcome: Progressing Goal: Progress toward achieving an optimal weight will improve Outcome: Progressing   Problem: Skin Integrity: Goal: Risk for impaired skin integrity will decrease Outcome: Progressing   Problem: Tissue Perfusion: Goal: Adequacy of tissue perfusion will improve Outcome: Progressing   Problem: Education: Goal: Knowledge of General Education information will improve Description: Including pain rating scale, medication(s)/side effects and non-pharmacologic comfort measures Outcome: Progressing   Problem: Health Behavior/Discharge Planning: Goal: Ability to manage health-related needs will improve Outcome: Progressing   Problem: Clinical Measurements: Goal: Ability to maintain clinical measurements within normal limits will improve Outcome: Progressing Goal: Will remain free from infection Outcome: Progressing Goal: Diagnostic test results will improve Outcome: Progressing Goal: Respiratory complications will improve Outcome: Progressing Goal: Cardiovascular complication will  be avoided Outcome: Progressing   Problem: Activity: Goal: Risk for activity intolerance will decrease Outcome: Progressing   Problem: Nutrition: Goal: Adequate nutrition will be maintained Outcome: Progressing   Problem: Coping: Goal: Level of anxiety will decrease Outcome: Progressing   Problem: Elimination: Goal: Will not experience complications related to bowel motility Outcome: Progressing Goal: Will not experience complications related to urinary retention Outcome: Progressing   Problem: Pain Managment: Goal: General experience of comfort will improve Outcome: Progressing   Problem: Safety: Goal: Ability to remain free from injury will improve Outcome: Progressing   Problem: Skin Integrity: Goal: Risk for impaired skin integrity will decrease Outcome: Progressing   Problem: Education: Goal: Ability to verbalize activity precautions or restrictions will improve Outcome: Progressing Goal: Knowledge of the prescribed therapeutic regimen will improve Outcome: Progressing Goal: Understanding of discharge needs will improve Outcome: Progressing   Problem: Activity: Goal: Ability to avoid complications of mobility impairment will improve Outcome: Progressing Goal: Ability to tolerate increased activity will improve Outcome: Progressing Goal: Will remain free from falls Outcome: Progressing   Problem: Bowel/Gastric: Goal: Gastrointestinal status for postoperative course will improve Outcome: Progressing   Problem: Clinical Measurements: Goal: Ability to maintain clinical measurements within normal limits will improve Outcome: Progressing Goal: Postoperative complications will be avoided or minimized Outcome: Progressing Goal: Diagnostic test results will improve Outcome: Progressing   Problem: Pain Management: Goal: Pain level will decrease Outcome: Progressing   Problem: Skin Integrity: Goal: Will show signs of wound healing Outcome: Progressing    Problem: Health Behavior/Discharge Planning: Goal: Identification of resources available to assist in meeting health care needs will improve Outcome: Progressing   Problem: Bladder/Genitourinary: Goal: Urinary functional status for postoperative course will improve Outcome: Progressing   

## 2022-05-06 NOTE — Progress Notes (Signed)
Inpatient Rehab Admissions Coordinator:  Saw pt and wife at bedside. Reviewed insurance benefits and cost of CIR. Also informed them that insurance authorization has started. Will continue to follow.   Wolfgang Phoenix, MS, CCC-SLP Admissions Coordinator [phone]

## 2022-05-06 NOTE — Discharge Summary (Incomplete)
Physician Discharge Summary  John Ware:[id number] DOB: [birth year] DOA: 04/22/2022  PCP: Sharion Balloon, FNP  Admit date: 04/22/2022 Discharge date: 05/08/2022  Admitted From: Home Disposition: Inpatient rehab   Recommendations for Outpatient Follow-up:  Follow up with neurosurgery in 2 to 4 weeks Please obtain BMP/CBC in one week We will go to inpatient rehab  Home Health:No Equipment/Devices:None  Discharge Condition:Stable CODE STATUS:Full Diet recommendation: Heart Healthy   Brief/Interim Summary: 56 y.o. male past medical history of tricuspid valve endocarditis, discitis and osteomyelitis, diabetes mellitus type 2, chronic diastolic heart failure comes in for progressive back pain was discharged home on 06/11/2021 after prolonged hospitalization for MSSA bacteremia and tricuspid endocarditis.  Underwent T10-T11 disc aspiration by IR, who grew Pseudomonas he is currently on vancomycin and cefepime.  ID recommended Doxy and cefepime for minimum of 6 weeks postoperatively if surgery is performed followed by lifelong doxycycline and ciprofloxacin. Neurosurgery recommended surgical intervention with  decompression and debridement of abscess via laminectomy of the T10 to T11 performed on 05/03/2022.  Discharge Diagnoses:  Principal Problem:   Osteomyelitis of vertebra of thoracolumbar region [facility]) Active Problems:   Discitis of thoracolumbar region   DM2 (diabetes mellitus, type 2) (HCC)   Chronic diastolic CHF (congestive heart failure) (HCC)   Chronic pain   Midline low back pain   Numbness   Osteomyelitis (HCC)  Osteomyelitis of the vertebrae of the thoracolumbar region: Infectious disease and neurosurgery were consulted. Neurosurgery recommended decompression and debridement of the epidural space via laminectomy of T10 and T11 on 05/02/2022. ID was consulted they recommended to start him on IV cefepime and doxycycline and continue for 6 weeks postsurgical followed by  suppressive therapy with Doxy and ciprofloxacin. PICC line was inserted on 05/04/2022. Pain is improved with narcotics and Neurontin. Patient is having regular bowel movements and does not require bowel regimen.  Diabetes mellitus type 2: With an A1c of 7.1 he was started on dexamethasone by neurosurgery. Blood glucose became erratic he was started on sliding scale insulin and his blood glucose has improved.  Dexamethasone was completed in house.  Chronic diastolic heart failure: Euvolemic no changes made to his medication.  Chronic pain syndrome: No changes made continue baclofen, MS Contin and oxycodone.  Skin rash: Improved with hydroxyzine.  Sacral decubitus ulcer stage I present on admission:   Discharge Instructions  Discharge Instructions     [facility] pharmacist to adjust dose for Vancomycin, Aminoglycosides and other anti-infective therapies as requested by physician.   Complete by: As directed    [facility] to provide Cath Flo 36m   Complete by: As directed    Administer for PICC line occlusion and as ordered by physician for other access device issues.   Anaphylaxis Kit: Provided to treat any anaphylactic reaction to the medication being provided to the patient if First Dose or when requested by physician   Complete by: As directed    Epinephrine 177mml vial / amp: Administer 0.36m79m0.36ml46mubcutaneously once for moderate to severe anaphylaxis, nurse to call physician and pharmacy when reaction occurs and call 911 if needed for immediate care   Diphenhydramine 50mg736mIV vial: Administer 25-50mg 34mM PRN for first dose reaction, rash, itching, mild reaction, nurse to call physician and pharmacy when reaction occurs   Sodium Chloride 0.9% NS 500ml I28mdminister if needed for hypovolemic blood pressure drop or as ordered by physician after call to physician with anaphylactic reaction   Change dressing on IV access line  weekly and PRN   Complete by:  As directed    Diet - low sodium heart healthy   Complete by: As directed    Flush IV access with Sodium Chloride 0.9% and Heparin 10 units/ml or 100 units/ml   Complete by: As directed    Home infusion instructions - [facility]   Complete by: As directed    Instructions: Flush IV access with Sodium Chloride 0.9% and Heparin 10units/ml or 100units/ml   Change dressing on IV access line: Weekly and PRN   Instructions Cath Flo 76m: Administer for PICC Line occlusion and as ordered by physician for other access device   [facility] pharmacist to adjust dose for: Vancomycin, Aminoglycosides and other anti-infective therapies as requested by physician   Incentive spirometry RT   Complete by: As directed    Increase activity slowly   Complete by: As directed    Method of administration may be changed at the discretion of home infusion pharmacist based upon assessment of the patient and/or caregiver's ability to self-administer the medication ordered   Complete by: As directed    No wound care   Complete by: As directed       Allergies as of 05/07/2022       Reactions   Tizanidine Other (See Comments)   Leg cramps        Medication List     TAKE these medications    acetaminophen 500 MG tablet Commonly known as: TYLENOL Take 1 tablet (500 mg total) by mouth every 6 (six) hours as needed.   Baclofen 5 MG Tabs TAKE ONE TABLET THREE TIMES DAILY   ceFEPime  IVPB Commonly known as: MAXIPIME Inject 2 g into the vein every 8 (eight) hours. 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   doxycycline 100 MG  tablet Commonly known as: VIBRA-TABS Take 1 tablet (100 mg total) by mouth 2 (two) times daily.   FreeStyle Libre 2 Sensor Misc CHECK BLOOD SUGAR 4 TIMES A DAY   gabapentin 100 MG capsule Commonly known as: NEURONTIN TAKE ONE CAPSULE TWICE DAILY   morphine 15 MG 12 hr tablet Commonly known as: MS Contin Take 3 tablets (45 mg total) by mouth every 12 (twelve) hours.   naloxone 0.4 MG/ML injection Commonly known as: NARCAN Inject 1 mL (0.4 mg total) into the vein as needed. What changed:  when to take this reasons to take this   oxyCODONE-acetaminophen 10-325 MG tablet Commonly known as: PERCOCET Take 1 tablet by mouth every 6 (six) hours as needed (breakthrough pain).               Discharge Care Instructions  (From admission, onward)           Start     Ordered   05/06/22 0000  Change dressing on IV access line weekly and PRN  (Home infusion instructions - [facility] )        05/06/22 0935            Allergies  Allergen Reactions   Tizanidine Other (See Comments)    Leg cramps    Consultations: Neurosurgery Infectious disease  Procedures/Studies: [place] EKG SITE RITE  Result Date: 05/03/2022 If Site Rite image not attached, placement could not be confirmed due to current cardiac rhythm.  DG THORACOLUMABAR SPINE  Result Date: 05/02/2022 CLINICAL DATA:  Thoracic laminectomy. EXAM: THORACOLUMBAR SPINE 1V COMPARISON:  None Available. FINDINGS: Two intraoperative fluoroscopic spot images provided. The total fluoroscopic time is 7.7 seconds and air Karma 5.13 mGy. IMPRESSION: Intraoperative fluoroscopic spot images. Electronically Signed   By: Anner Crete M.D.   On: 05/02/2022 19:34   DG C-Arm 1-60 Min-No Report  Result Date: 05/02/2022 Fluoroscopy was utilized by the requesting physician.  No radiographic interpretation.   MR THORACIC SPINE W WO CONTRAST  Result Date: 05/01/2022 CLINICAL DATA:  Osteomyelitis EXAM: MRI THORACIC WITHOUT  AND WITH CONTRAST TECHNIQUE: Multiplanar and multiecho pulse sequences of the thoracic spine were obtained without and with intravenous contrast. CONTRAST:  7.70m GADAVIST GADOBUTROL 1 MMOL/ML IV SOLN COMPARISON:  Thoracic spine MRI 04/23/2022 FINDINGS: Alignment:  Normal. Vertebrae: There is STIR signal abnormality and enhancement in the T9 through T12 vertebral bodies, worst at T10 and T11, increased at T9 and T12 since 04/23/2022 there is fluid signal and enhancement in the intervening T10-T11 disc space. The other disc spaces are spared. There is abnormal enhancement about the left T10-T11 facet joint which remains concerning for septic arthritis (21-15, 24-41). There is epidural phlegmon extending along the posterior endplates at T9 through T12 measuring up to proximally 4 mm in maximal thickness with small foci of non enhancement (24-36, 24-38), which remains consistent with epidural phlegmon/abscess. This again results in severe spinal canal stenosis at T10-T11 and T11-T12 with cord compression and now with possible cord signal abnormality (18-35) which is slightly increased in conspicuity since the prior study. Enhancing tissue in the paraspinal soft tissues from T9 through T12 likely also reflecting phlegmon is not significantly changed. Sequela of prior discitis/osteomyelitis at T3-T4 is unchanged with unchanged obliteration of the intervening disc space. Marrow signal at the other levels is within normal limits. Cord: Possible cord signal abnormality at T10-T11 as above. The cord above this level is normal signal and morphology. Paraspinal and other soft tissues: Again seen is a fluid collection in the posterior soft tissues to the left of midline at the T11-T12 level measuring 4.2 cm TV x 1.1 cm AP by 3.3 cm cc, not significantly changed. There is no convincing peripheral enhancement. Diffuse enhancement in the posterior soft tissues throughout the midthoracic spine is stable to slightly decreased.  Abnormal enhancement the paraspinal soft tissues from T9 through T12 is not significantly changed, as above. There are small left and trace right pleural effusions, not significantly changed. Disc levels: Scattered disc protrusions are again seen, most notable at T4-T5, T5-T6, and T8-T9 resulting in mild spinal canal stenosis with flattening of the ventral cord surface. Multilevel facet arthropathy with neural foraminal stenosis most advanced at T10-T11 and T11-T12 is similar to the prior study. IMPRESSION: 1. Findings consistent with persistent osteomyelitis at T9 through T12, with signal abnormality having worsened at T9 and T12 since 04/23/2022. 2. Findings remain concerning for left septic arthritis at T11-T12. 3. 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 4. Unchanged enhancement of the paraspinal soft tissues at T9 through T12 without evidence of paraspinal abscess. 5. Unchanged fluid collection in the left posterior paraspinal soft tissues at T11-T12.  6. Stable to slightly decreased diffuse enhancement in the posterior soft tissues extending from T2 through T7. 7. Unchanged small left and trace right pleural effusions. Electronically Signed   By: Valetta Mole M.D.   On: 05/01/2022 13:45   [place] Abscess Drain  Result Date: 04/25/2022 INDICATION: Briefly, 56 year old male with history of L-spine osteomyelitis presenting with back pain with MR T-spine revealing osteomyelitis discitis of the thoracic spine with superficial phlegmon/abscess. EXAM: ULTRASOUND GUIDED LEFT SUPERFICIAL THORACIC ABSCESS ASPIRATION COMPARISON:  IR fluoroscopy, 04/24/2022.  MR T-spine, 04/23/2022. MEDICATIONS: The patient is currently admitted to the hospital and receiving intravenous antibiotics. The antibiotics were administered  within an appropriate time frame prior to the initiation of the procedure. ANESTHESIA/SEDATION: Local anesthetic was administered. CONTRAST:  None COMPLICATIONS: None immediate. PROCEDURE: Informed written consent was obtained from the patient after a discussion of the risks, benefits and alternatives to treatment. Preprocedural ultrasound scanning demonstrated a heterogeneously echogenic LEFT superficial back partial fluid collection. A timeout was performed prior to the initiation of the procedure. The LEFT back was prepped and draped in the usual sterile fashion. The overlying soft tissues were anesthetized with 1% lidocaine with epinephrine. Under direct ultrasound guidance, a 18 gauge trocar needle was advanced into the abscess/fluid collection. Multiple ultrasound images were saved for procedural documentation purposes. Next, approximately 1 mL of serosanguineous fluid was aspirated from the collection. A representative sample of aspirated fluid was capped and sent to the laboratory for analysis. The needle was removed and superficial hemostasis was achieved with manual compression. A dressing was placed. The patient tolerated the procedure well without immediate postprocedural complication. IMPRESSION: Successful US guided aspiration of 1 mL of serosanguineous fluid from LEFT superficial thoracic collection. A representative aspirated sample was sent to the laboratory as requested by the ordering clinical team. Michaelle Birks, MD [facility] Electronically Signed   By: Michaelle Birks M.D.   On: 04/25/2022 12:35   IR Fluoro Guide Ndl Plmt / BX  Result Date: 04/24/2022 INDICATION: John Ware is a 56 y.o. male with medical history significant for osteomyelitis of the lumbar spine, chronic diastolic heart failure, and type 2 diabetes mellitus. He presented to the ED on 04/22/2022 complaining of back pain and was found to have acute  osteomyelitis/discitis of the thoracic spine. He comes to our service today for a T10-11 fluoroscopy guided disc aspiration. EXAM: FLUOROSCOPY GUIDED T10-11 INTERVERTEBRAL DISC ASPIRATION MEDICATIONS: The patient is currently admitted to the hospital and receiving intravenous antibiotics. The antibiotics were administered within an appropriate time frame prior to the initiation of the procedure. ANESTHESIA/SEDATION: Moderate (conscious) sedation was employed during this procedure. A total of Versed 3.5 mg and Fentanyl 175 mcg were administered intravenously for moderate conscious sedation monitored under my direct supervision. Total intraservice time of sedation was 27 minutes. The patient's vital signs were monitored throughout the procedure and recorded in the patient's medical record by the nurse. COMPLICATIONS: None immediate. PROCEDURE: Informed written consent was obtained from the patient after a thorough discussion of the procedural risks, benefits and alternatives. All questions were addressed. Maximal Sterile Barrier Technique was utilized including caps, mask, sterile gowns, sterile gloves, sterile drape, hand hygiene and skin antiseptic. A timeout was performed prior to the initiation of the procedure. The patient was placed in prone position on the angiography table. The thoracic spine region was prepped and draped in a sterile fashion. Under fluoroscopy, the T10-11 was delineated and the skin area was marked. The skin was  infiltrated with a 1% Lidocaine approximately 4 cm lateral to the spinous process projection on the left. Under fluoroscopy guidance, an 18-gauge trocar needle was into the T10-11 disc space using a left posterolateral approach. Then, a 22 gauge x 20 cm spinal needle was advanced through the trocar into the disc space and an FNA pass was performed. Given minimal aspirate, the disc space was injected with 1 cc of sterile saline and 1 cc of bupivacaine. An additional FNA pass was  performed yielding approximately 1 cc of blood tinged aspirate. The needle was subsequently withdrawn. The access sites were cleaned and covered with a sterile bandage. Peak skin dose (PSD) 440 mGy. IMPRESSION: Successful fine-needle aspiration of the T10-11 intervertebral disc. Aspirate was sent to the laboratory for Gram stain and culture. Electronically Signed   By: Pedro Earls M.D.   On: 04/24/2022 14:24   MR THORACIC SPINE W WO CONTRAST  Result Date: 04/23/2022 CLINICAL DATA:  Osteomyelitis. EXAM: MRI THORACIC WITHOUT AND WITH CONTRAST TECHNIQUE: Multiplanar and multiecho pulse sequences of the thoracic spine were obtained without and with intravenous contrast. CONTRAST:  80m GADAVIST GADOBUTROL 1 MMOL/ML IV SOLN COMPARISON:  Lumbar spine MRI dated 1 day prior, thoracic spine MRI 02/15/2021 FINDINGS: Alignment: There is trace retrolisthesis of T3 on T4 which is increased since 02/15/2021. Vertebrae: Vertebral body heights are preserved. Background marrow signal is normal. There is confluent T1 hypointensity with associated STIR hyperintensity and postcontrast enhancement through the entire T10 and T11 vertebral bodies as well as the T10-T11 disc space, and to a lesser degree in the T9 and T12 vertebral bodies consistent with discitis/osteomyelitis. There is enhancement along the ventral epidural space extending from T9-T10 through T12 measuring up to 4 mm, and to a lesser degree along the dorsal epidural space measuring approximately 1 mm in thickness. There is central non enhancement which is more apparent on the sagittal images but is also seen on the axial images at T10-T11 (22-10, 21-33) raising suspicion for abscess. This results in severe spinal canal stenosis at T10-T11 with cord compression but no definite cord signal abnormality, and moderate to severe spinal canal stenosis at T11-T12 also without definite cord signal abnormality. There is enhancement and edema about the left  facet joint at T11-T12 consistent with associated septic arthritis. There is extensive enhancement in the paraspinal soft tissues both bilaterally and anteriorly likely reflecting additional phlegmon without evidence of paraspinal abscess. There is also enhancement in the posterior paraspinal soft tissues on the left at T11-T12 and in the interspinous spaces at T9-T10 through T11-T12. There is obliteration of the intervening disc space at T3-T4 with grade 1 retrolisthesis, likely reflecting sequela of prior discitis/osteomyelitis at this level. There is no signal abnormality or enhancement to suggest osteomyelitis at this level. Cord: As above, there is cord compression at T10-T11 and T11-T12 without definite cord signal abnormality. There is no abnormal cord enhancement. Paraspinal and other soft tissues: As above, there is extensive enhancement in the paraspinal soft tissues at T9 through T11 There is additional edema and enhancement in the posterior soft tissues throughout the midthoracic spine extending from T2 through T7. There is a T2 hyperintense collection in the posterior soft tissues to the left of midline at T11-T12 measuring 2.6 cm TV x 1.2 cm AP x 4.2 cm cc as seen on the lumbar spine MRI from 1 day prior. There is no convincing peripheral enhancement about this collection. Disc levels: There are disc protrusions at C7-T1, T4-T5, T5-T6, T8-T9,  and T9-T10 resulting in up to mild spinal canal stenosis with flattening of the ventral cord surface T5-T6 and T8-T9. There is severe spinal canal stenosis at T10-T11 and moderate to severe spinal canal stenosis at T11-T12 due to the above-described abscess. There is multilevel facet arthropathy resulting in up to moderate bilateral neural foraminal stenosis at T10-T11 and T11-T12. IMPRESSION: 1. Findings consistent with discitis/osteomyelitis T9-T10 through T11-T12 (worst at T10-T11) with epidural phlegmon and abscess along the posterior endplates from D7-O24  through T12 measuring up to 4 mm in thickness, resulting in severe spinal canal stenosis and cord compression but no definite cord signal abnormality. 2. Septic arthritis on the left at T11-T12. 3. Extensive enhancement in the paraspinal soft tissues at these levels consistent with additional phlegmonous change without evidence of paraspinal abscess. 4. Additional enhancement throughout the posterior soft tissues in the midthoracic spine extending from T2 through T7 without evidence of fluid collection at these levels. 5. Fluid collection in the posterior soft tissues to the left of midline at T12 without convincing peripheral enhancement may reflect a hematoma or seroma, though abscess is not entirely excluded. 6. Obliteration of the disc space with grade 1 retrolisthesis at T3-T4 likely reflecting sequela of prior discitis/osteomyelitis at this level. Electronically Signed   By: Valetta Mole M.D.   On: 04/23/2022 14:11   MR Lumbar Spine W Wo Contrast  Result Date: 04/22/2022 CLINICAL DATA:  Initial evaluation for back pain, lower extremity weakness. EXAM: MRI LUMBAR SPINE WITHOUT AND WITH CONTRAST TECHNIQUE: Multiplanar and multiecho pulse sequences of the lumbar spine were obtained without and with intravenous contrast. CONTRAST:  53m GADAVIST GADOBUTROL 1 MMOL/ML IV SOLN COMPARISON:  Prior study from 08/17/2021. FINDINGS: Segmentation: Standard. Lowest well-formed disc space labeled the L5-S1 level. Alignment: Chronic grade 1 anterolisthesis of L5 on S1, stable. Straightening with mild reversal of the normal lumbar lordosis. Vertebrae: Sequelae of prior PLIF at L3 through S1. Interval evolution of previously seen changes of osteomyelitis discitis at L1-2 and L2-3. Mild residual edema within the L1-2 and L2-3 interspaces, improved from prior exam. New findings of probable acute osteomyelitis discitis about the T11-12 interspace, new from prior. Heterogeneous signal intensity within the adjacent ventral  epidural space likely reflects epidural phlegmon and/or abscess, seen only on sagittal position (series 5, image 10). Epidural enhancement extends from the lower thoracic spine to approximately the level of T12-L1 (series 12, image 10). Conus medullaris and cauda equina: Conus extends to the L1 level. Conus medullaris within normal limits. Epidural phlegmon/abscess with enhancement extending from the visualized lower thoracic spine to approximately T12-L1. Mild heterogeneity of the proximal cauda equina at the level of L1-2 likely related to prior infection at this level. Remainder of the nerve roots of the cauda equina are within normal limits. Paraspinal and other soft tissues: Persistent edema within the psoas musculature bilaterally. Edema also seen throughout the posterior paraspinous musculature. Benign postoperative collection centered at the laminectomy site of L4-5, stable. Probable soft tissue abscess within the subcutaneous fat of the left back at the level of T12 measures approximately 2.4 x 1.1 x 2.9 cm (series 8, image 1). Disc levels: T11-12: Seen only on sagittal projection. Findings concerning for osteomyelitis discitis. Epidural phlegmon/abscess with enhancement seen within the adjacent epidural space. Bilateral facet arthropathy. Resultant mild spinal stenosis. Moderate to severe bilateral foraminal narrowing. T12-L1: Disc desiccation with reactive endplate spurring. Superimposed right subarticular disc protrusion indents the right ventral thecal sac. Superimposed facet hypertrophy. Resultant mild to moderate right lateral recess  stenosis. Central canal remains patent. Foramina remain patent as well. L1-2: Interval evolution of changes related to previously identified osteomyelitis discitis. Associated advanced height loss with endplate irregularity. Moderate bilateral facet hypertrophy. No significant spinal stenosis. Mild to moderate bilateral foraminal narrowing. L2-3: Interval evolution of  changes related to osteomyelitis discitis within associated advanced disc space height loss. Moderate bilateral facet hypertrophy. No significant spinal stenosis. Moderate to severe bilateral foraminal stenosis, similar to prior. L3-4: Prior PLIF. No residual spinal stenosis. Stable foraminal patency. L4-5: Prior PLIF. No residual spinal stenosis. Stable foraminal patency. L5-S1: Prior PLIF. No residual spinal stenosis. Residual mild to moderate bilateral foraminal stenosis related to underlying listhesis, stable. IMPRESSION: 1. New findings consistent with acute osteomyelitis discitis about the T11-12 interspace. Associated epidural phlegmon/abscess extending from the visualized lower thoracic spine to approximately the level of T12-L1. Resultant mild spinal stenosis at the level of T11-12 without frank cord compression. 2. 2.4 x 1.1 x 2.9 cm probable soft tissue abscess within the subcutaneous fat of the left back at the level of T12. 3. 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 4. Prior PLIF at L3 through S1 without residual or recurrent spinal stenosis. Electronically Signed   By: Jeannine Boga M.D.   On: 04/22/2022 20:08     Subjective: No complaints  Discharge Exam: Vitals:   05/07/22 0506 05/07/22 0810  BP: 134/89 117/80  Pulse: 67 (!) 56  Resp: 16 18  Temp: 98.3 F (36.8 C) 98 F (36.7 C)  SpO2: 100% 100%   Vitals:   05/06/22 1500 05/06/22 1957 05/07/22 0506 05/07/22 0810  BP: 127/78 139/85 134/89 117/80  Pulse: 84 76 67 (!) 56  Resp: '16 16 16 18  ' Temp: 98.1 F (36.7 C) 98.2 F (36.8 C) 98.3 F (36.8 C) 98 F (36.7 C)  TempSrc: Oral Oral Oral Oral  SpO2: 99% 100% 100% 100%  Weight:      Height:        General: Pt is alert, awake, not in acute distress Cardiovascular: RRR, S1/S2 +, no rubs, no gallops Respiratory: CTA bilaterally, no wheezing, no rhonchi Abdominal: Soft, NT, ND,  bowel sounds + Extremities: no edema, no cyanosis    The results of significant diagnostics from this hospitalization (including imaging, microbiology, ancillary and laboratory) are listed below for reference.     Microbiology: Recent Results (from the past 240 hour(s))  Surgical pcr screen     Status: None   Collection Time: 05/02/22 10:33 AM   Specimen: Nasal Mucosa; Nasal Swab  Result Value Ref Range Status   MRSA, PCR NEGATIVE NEGATIVE Final   Staphylococcus aureus NEGATIVE NEGATIVE Final    Comment: (NOTE) The Xpert SA Assay (FDA approved for NASAL specimens in patients 56 years of age and older), is one component of a comprehensive surveillance program. It is not intended to diagnose infection nor to guide or monitor treatment. Performed at [facility], [place] [address], [place], [place] [address]   Aerobic/Anaerobic Culture w Gram Stain (surgical/deep wound)     Status: None (Preliminary result)   Collection Time: 05/02/22  4:41 PM   Specimen: PATH Soft tissue  Result Value Ref Range Status   Specimen Description TISSUE  Final   Special Requests SQ TISSUE  Final   Gram Stain NO WBC SEEN NO ORGANISMS SEEN   Final   Culture   Final    NO GROWTH 4 DAYS NO ANAEROBES  ISOLATED; CULTURE IN PROGRESS FOR 5 DAYS Performed at [facility], [place] [address] [place], [place] [address]    Report Status PENDING  Incomplete  Aerobic/Anaerobic Culture w Gram Stain (surgical/deep wound)     Status: None (Preliminary result)   Collection Time: 05/02/22  5:18 PM   Specimen: PATH Soft tissue  Result Value Ref Range Status   Specimen Description TISSUE  Final   Special Requests THORACIC 9 AND 10 LAMINECTOMY  Final   Gram Stain NO WBC SEEN NO ORGANISMS SEEN   Final   Culture   Final    NO GROWTH 4 DAYS NO ANAEROBES ISOLATED; CULTURE IN PROGRESS FOR 5 DAYS Performed at [facility], [place] [address] [place], [place] [address]    Report Status PENDING  Incomplete      Labs: BNP (last 3 results) No results for input(s): "BNP" in the last 8760 hours. Basic Metabolic Panel: No results for input(s): "NA", "K", "CL", "CO2", "GLUCOSE", "BUN", "CREATININE", "CALCIUM", "MG", "PHOS" in the last 168 hours.  Liver Function Tests: No results for input(s): "AST", "ALT", "ALKPHOS", "BILITOT", "PROT", "ALBUMIN" in the last 168 hours. No results for input(s): "LIPASE", "AMYLASE" in the last 168 hours. No results for input(s): "AMMONIA" in the last 168 hours. CBC: No results for input(s): "WBC", "NEUTROABS", "HGB", "HCT", "MCV", "PLT" in the last 168 hours. Cardiac Enzymes: No results for input(s): "CKTOTAL", "CKMB", "CKMBINDEX", "TROPONINI" in the last 168 hours. BNP: Invalid input(s): "POCBNP" CBG: Recent Labs  Lab 05/02/22 1146 05/02/22 1430 05/02/22 1750 05/03/22 0631 05/03/22 1333  GLUCAP 134* 140* 111* 239* 121*   D-Dimer No results for input(s): "DDIMER" in the last 72 hours. Hgb A1c No results for input(s): "HGBA1C" in the last 72 hours.  Lipid Profile No results for input(s): "CHOL", "HDL", "LDLCALC", "TRIG", "CHOLHDL", "LDLDIRECT" in the last 72 hours. Thyroid function studies No results for input(s): "TSH", "T4TOTAL", "T3FREE", "THYROIDAB" in the last 72 hours.  Invalid input(s): "FREET3" Anemia work up No results for input(s): "VITAMINB12", "FOLATE", "FERRITIN", "TIBC", "IRON", "RETICCTPCT" in the last 72 hours. Urinalysis    Component Value Date/Time   COLORURINE YELLOW 04/22/2022 2315   APPEARANCEUR CLEAR 04/22/2022 2315   LABSPEC 1.009 04/22/2022 2315   PHURINE 5.0 04/22/2022 2315   GLUCOSEU NEGATIVE 04/22/2022 2315   HGBUR SMALL (A) 04/22/2022 2315   BILIRUBINUR NEGATIVE 04/22/2022 2315   KETONESUR NEGATIVE 04/22/2022 2315   PROTEINUR NEGATIVE 04/22/2022 2315   NITRITE NEGATIVE 04/22/2022 2315   LEUKOCYTESUR NEGATIVE 04/22/2022 2315   Sepsis Labs No results for input(s): "WBC" in the last 168 hours.  Invalid input(s):  "PROCALCITONIN", "LACTICIDVEN" Microbiology Recent Results (from the past 240 hour(s))  Surgical pcr screen     Status: None   Collection Time: 05/02/22 10:33 AM   Specimen: Nasal Mucosa; Nasal Swab  Result Value Ref Range Status   MRSA, PCR NEGATIVE NEGATIVE Final   Staphylococcus aureus NEGATIVE NEGATIVE Final    Comment: (NOTE) The Xpert SA Assay (FDA approved for NASAL specimens in patients 85 years of age and older), is one component of a comprehensive surveillance program. It is not intended to diagnose infection nor to guide or monitor treatment. Performed at [facility], [place] [address], [place], [place] [address]   Aerobic/Anaerobic Culture w Gram Stain (surgical/deep wound)     Status: None (Preliminary result)   Collection Time: 05/02/22  4:41 PM   Specimen: PATH Soft tissue  Result Value Ref Range Status   Specimen  Description TISSUE  Final   Special Requests SQ TISSUE  Final   Gram Stain NO WBC SEEN NO ORGANISMS SEEN   Final   Culture   Final    NO GROWTH 4 DAYS NO ANAEROBES ISOLATED; CULTURE IN PROGRESS FOR 5 DAYS Performed at [facility], [place] [address] [place], [place] [address]    Report Status PENDING  Incomplete  Aerobic/Anaerobic Culture w Gram Stain (surgical/deep wound)     Status: None (Preliminary result)   Collection Time: 05/02/22  5:18 PM   Specimen: PATH Soft tissue  Result Value Ref Range Status   Specimen Description TISSUE  Final   Special Requests THORACIC 9 AND 10 LAMINECTOMY  Final   Gram Stain NO WBC SEEN NO ORGANISMS SEEN   Final   Culture   Final    NO GROWTH 4 DAYS NO ANAEROBES ISOLATED; CULTURE IN PROGRESS FOR 5 DAYS Performed at [facility], [place] [address], [place], [place] [address]    Report Status PENDING  Incomplete     SIGNED:   Charlynne Cousins, MD  [facility] 05/07/2022, 8:55 AM Pager   If 7PM-7AM, please contact night-coverage [email or web address] Password [email or web address]

## 2022-05-06 NOTE — Progress Notes (Signed)
Patient ID: John Ware, male   DOB: September 30, 1965, 56 y.o.   MRN: [id number] Vital signs are stable Patient's motor function appears stable He does note he feels that his legs are little bit weaker than they had been His incisions are clean and dry He is allowed to shower I agree with need for comprehensive inpatient rehabilitation I can follow him up as an outpatient At this point he will continue his IV antibiotics

## 2022-05-06 NOTE — Progress Notes (Signed)
[facility]   Reason for visit: Follow up on Pseudomonal discitis  Interval History: no fever; remains on cefepime and oral doxycycline  Physical Exam: Constitutional:  Vitals:   05/06/22 0738 05/06/22 1100  BP: 108/75 114/78  Pulse: 60 78  Resp: 16 16  Temp: 97.9 F (36.6 C) 97.9 F (36.6 C)  SpO2: 98% 100%   patient appears in NAD Respiratory: Normal respiratory effort  Review of Systems: Constitutional: negative for fevers and chills  Lab Results  Component Value Date   WBC 11.5 (H) 04/28/2022   HGB 9.7 (L) 04/28/2022   HCT 30.4 (L) 04/28/2022   MCV 79.0 (L) 04/28/2022   PLT 526 (H) 04/28/2022    Lab Results  Component Value Date   CREATININE 0.64 04/30/2022   BUN 10 04/30/2022   NA 136 04/30/2022   K 4.2 04/30/2022   CL 103 04/30/2022   CO2 24 04/30/2022    Lab Results  Component Value Date   ALT 11 04/23/2022   AST 13 (L) 04/23/2022   ALKPHOS 75 04/23/2022     Microbiology: Recent Results (from the past 240 hour(s))  Surgical pcr screen     Status: None   Collection Time: 05/02/22 10:33 AM   Specimen: Nasal Mucosa; Nasal Swab  Result Value Ref Range Status   MRSA, PCR NEGATIVE NEGATIVE Final   Staphylococcus aureus NEGATIVE NEGATIVE Final    Comment: (NOTE) The Xpert SA Assay (FDA approved for NASAL specimens in patients 16 years of age and older), is one component of a comprehensive surveillance program. It is not intended to diagnose infection nor to guide or monitor treatment. Performed at [facility], [address], [place], [place] [address]   Aerobic/Anaerobic Culture w Gram Stain (surgical/deep wound)     Status: None (Preliminary result)   Collection Time: 05/02/22  4:41 PM   Specimen: PATH Soft tissue  Result Value Ref Range Status   Specimen Description TISSUE  Final   Special Requests SQ TISSUE  Final   Gram Stain NO WBC SEEN NO ORGANISMS SEEN   Final   Culture   Final    NO GROWTH 4 DAYS  NO ANAEROBES ISOLATED; CULTURE IN PROGRESS FOR 5 DAYS Performed at [facility], [address], [place], [place] [address]    Report Status PENDING  Incomplete  Aerobic/Anaerobic Culture w Gram Stain (surgical/deep wound)     Status: None (Preliminary result)   Collection Time: 05/02/22  5:18 PM   Specimen: PATH Soft tissue  Result Value Ref Range Status   Specimen Description TISSUE  Final   Special Requests THORACIC 9 AND 10 LAMINECTOMY  Final   Gram Stain NO WBC SEEN NO ORGANISMS SEEN   Final   Culture   Final    NO GROWTH 4 DAYS NO ANAEROBES ISOLATED; CULTURE IN PROGRESS FOR 5 DAYS Performed at [facility], [address] [place], [place] [address]    Report Status PENDING  Incomplete    Impression/Plan:  1. Osteomyelitis/discitis of thoracolumbar region - s/p decompression and debridement of epidural abscess and ostemyelitis with laminectomy on 9/7.   He continues on cefepime with aspiration positive for Pseudomonas, pansensitive from 8/30 and 8/31.  Plan for 6 weeks IV cefepime  2. Opat - opat orders done 9/8 with end date planned for 06/13/22.    3.  Disposition - to CIR.    4.  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    Follow up with Dr. Elinor Parkinson 10/4  ID team will sign off, call with any questions

## 2022-05-06 NOTE — Progress Notes (Signed)
Physical Therapy Treatment Patient Details Name: John Ware MRN: [id number] DOB: 11/27/1965 Today's Date: 05/06/2022   History of Present Illness Pt is a 57 y/o male presenting on 8/28 with progressive back pain. Admitted with osteomyelitis. S/P T10-11 disc aspiration via laminectomy 9/7, Noted prolonged hopsitalization in 10/22 due to MSSA bacteremia and tricuspid endocarditis. PMH includes: tricuspid valve endocarditis, discitits and osteomyelitiis, DM2, chronic diastolic heart failure.    PT Comments    Pt progressing towards all goals. Despite pain pt with improved ambulation tolerance. Pt continues with L LE ataxia, bilat LE weakness, and dependence on bilat UEs for ambulation via RA. Pt very motivated as he was ambulating without AD PTA. Pt to benefit from AIR Upon d/c to progress towards indep function. Acute PT to cont to follow.    Recommendations for follow up therapy are one component of a multi-disciplinary discharge planning process, led by the attending physician.  Recommendations may be updated based on patient status, additional functional criteria and insurance authorization.  Follow Up Recommendations  Acute inpatient rehab (3hours/day)     Assistance Recommended at Discharge Frequent or constant Supervision/Assistance  Patient can return home with the following A little help with bathing/dressing/bathroom;A lot of help with walking and/or transfers;Assistance with cooking/housework;Help with stairs or ramp for entrance;Assist for transportation   Equipment Recommendations  None recommended by PT    Recommendations for Other Services Rehab consult     Precautions / Restrictions Precautions Precautions: Back;Fall Precaution Booklet Issued: No Precaution Comments: suggested log rolling for pain management Required Braces or Orthoses:  (no brace per MD order) Restrictions Weight Bearing Restrictions: No     Mobility  Bed Mobility Overal bed mobility: Needs  Assistance Bed Mobility: Sidelying to Sit, Rolling Rolling: Modified independent (Device/Increase time) Sidelying to sit: HOB elevated, Modified independent (Device/Increase time)       General bed mobility comments: encouraged log roll however pt brough self up into long sit without use of bed rail, HOB elevated    Transfers Overall transfer level: Needs assistance Equipment used: Rolling walker (2 wheels) Transfers: Sit to/from Stand Sit to Stand: Min assist           General transfer comment: bed elevated, minA for safety and stability during transition of hands from bed to RW    Ambulation/Gait Ambulation/Gait assistance: Mod assist, +2 safety/equipment Gait Distance (Feet): 18 Feet (x1, 26'x1) Assistive device: Rolling walker (2 wheels) Gait Pattern/deviations: Step-to pattern, Ataxic, Narrow base of support Gait velocity: reduced Gait velocity interpretation: <1.31 ft/sec, indicative of household ambulator   General Gait Details: pt with noted L LE ataxia, modA to prevent L LE from adducting to the R, pt with supinated L foot, pt with increased depence on bilat UEs due to feeling of bilat LEs from buckling   Stairs             Wheelchair Mobility    Modified Rankin (Stroke Patients Only)       Balance Overall balance assessment: Needs assistance Sitting-balance support: No upper extremity supported, Feet supported Sitting balance-[name]: Good Sitting balance - Comments: preference to 1 UE support dynamically, but weak postural muscles noted with posterior pelvic tilt, rounded spine and forward head and shoulders and preferencing UE support   Standing balance support: Bilateral upper extremity supported, Reliant on assistive device for balance Standing balance-[name]: Poor Standing balance comment: reliant on external support  Cognition Arousal/Alertness: Awake/alert Behavior During Therapy: WFL for  tasks assessed/performed Overall Cognitive Status: Within Functional Limits for tasks assessed                                 General Comments: pt aware of deficits, didn't appear to be as fearful with falling, expressed gratitude for "being pushed today"        Exercises      General Comments General comments (skin integrity, edema, etc.): VSS on RA      Pertinent Vitals/Pain Pain Assessment Pain Assessment: 0-10 Pain Score: 9     Home Living                          Prior Function            PT Goals (current goals can now be found in the care plan section) Acute Rehab PT Goals Patient Stated Goal: to return to independent PT Goal Formulation: With patient Time For Goal Achievement: 05/17/22 Potential to Achieve Goals: Good Progress towards PT goals: Progressing toward goals    Frequency    Min 5X/week      PT Plan Current plan remains appropriate    Co-evaluation              AM-PAC PT "6 Clicks" Mobility   Outcome Measure  Help needed turning from your back to your side while in a flat bed without using bedrails?: None Help needed moving from lying on your back to sitting on the side of a flat bed without using bedrails?: A Little Help needed moving to and from a bed to a chair (including a wheelchair)?: A Little Help needed standing up from a chair using your arms (e.g., wheelchair or bedside chair)?: A Little Help needed to walk in hospital room?: A Lot Help needed climbing 3-5 steps with a railing? : Total 6 Click Score: 16    End of Session Equipment Utilized During Treatment: Gait belt Activity Tolerance: Patient tolerated treatment well Patient left: in chair;with call bell/phone within reach (pt refused to have chair alarm put on stating "I already have enough pain.  I don't want to induce more by falling. I'm not 56yo, I know what I need to do.") Nurse Communication: Mobility status PT Visit Diagnosis: Other  abnormalities of gait and mobility (R26.89);Muscle weakness (generalized) (M62.81)     Time: 1008-1030 PT Time Calculation (min) (ACUTE ONLY): 22 min  Charges:  $Gait Training: 8-22 mins                     Lewis Shock, PT, DPT Acute Rehabilitation Services Secure chat preferred Office #: [phone]    Iona Hansen 05/06/2022, 10:42 AM

## 2022-05-06 NOTE — Progress Notes (Signed)
[facility] PROGRESS NOTE    Progress Note  John Ware  KNL:[id number] DOB: 11-02-1965 DOA: 04/22/2022 PCP: Junie Spencer, FNP     Brief Narrative:   John Ware is an 56 y.o. male past medical history of tricuspid valve endocarditis, discitis and osteomyelitis, diabetes mellitus type 2, chronic diastolic heart failure comes in for progressive back pain was discharged home on 06/11/2021 after prolonged hospitalization for MSSA bacteremia and tricuspid endocarditis.  Underwent T10-T11 disc aspiration by IR, who grew Pseudomonas he is currently on vancomycin and cefepime.  ID recommended Doxy and cefepime for minimum of 6 weeks postoperatively if surgery is performed followed by lifelong doxycycline and ciprofloxacin. Neurosurgery recommended surgical intervention with  decompression and debridement of abscess via laminectomy of the T10 to T11 performed on 05/03/2022.   Assessment/Plan:   Osteomyelitis of vertebra of thoracolumbar region [facility]) He underwent decompression and debridement of an epidural abscess and osteomyelitis via laminectomy and T10-T11 by [name] on 05/02/2022. ID was consulted and recommended Doxy and IV cefepime for 6 weeks postsurgical then followed by suppressive therapy with Doxy and Cipro. PT evaluated the patient and recommended inpatient rehab. PICC line to be inserted 05/04/2022. Pain is improved on Neurontin and narcotics, he is having regular bowel movements without a bowel regimen. Awaiting insurance approval to transfer to inpatient rehab  Diabetes mellitus type 2: A1c 7.1, he was started on dexamethasone blood glucose has become erratic. Currently on sliding scale insulin blood glucose improved. Refusing CBGs  Chronic diastolic heart failure: Appears euvolemic continue current medication.  Chronic pain syndrome: Continue baclofen MS Contin and oxycodone. Continue IV morphine for breakthrough pain.  Skin rash: Improved with  hydroxyzine.  Stage I sacral decubitus ulcer of the left buttock RN Pressure Injury Documentation: Pressure Injury 06/11/21 Buttocks Left Stage 1 -  Intact skin with non-blanchable redness of a localized area usually over a bony prominence. (Active)  06/11/21 1617  Location: Buttocks  Location Orientation: Left  Staging: Stage 1 -  Intact skin with non-blanchable redness of a localized area usually over a bony prominence.  Wound Description (Comments):   Present on Admission: Yes     Pressure Injury 06/11/21 Buttocks Right Stage 1 -  Intact skin with non-blanchable redness of a localized area usually over a bony prominence. (Active)  06/11/21 1618  Location: Buttocks  Location Orientation: Right  Staging: Stage 1 -  Intact skin with non-blanchable redness of a localized area usually over a bony prominence.  Wound Description (Comments):   Present on Admission: Yes    Estimated body mass index is 22.3 kg/m as calculated from the following:   Height as of this encounter: 5\' 10"  (1.778 m).   Weight as of this encounter: 70.5 kg.  DVT prophylaxis: lovenox Family Communication:Wife Status is: Inpatient Remains inpatient appropriate because: Osteomyelitis and abscess of the back    Code Status:     Code Status Orders  (From admission, onward)           Start     Ordered   04/22/22 2233  Full code  Continuous        04/22/22 2233           Code Status History     Date Active Date Inactive Code Status Order ID Comments User Context   06/11/2021 1552 06/26/2021 1813 Full Code 13/08/2020  [id number] Inpatient   05/28/2021 1925 06/11/2021 1538 Full Code 06/13/2021  Arrien, [id number], MD ED  01/31/2021 1453 03/15/2021 2102 Full Code [id number]  Jonnie Finner, DO Inpatient   10/14/2017 1803 10/15/2017 1738 Full Code [id number]  Kristeen Miss, MD Inpatient   12/13/2014 1654 12/14/2014 1323 Full Code [id number]  Kristeen Miss, MD Inpatient         IV Access:    Peripheral IV   Procedures and diagnostic studies:   No results found.   Medical Consultants:   None.   Subjective:    John Ware relates his pain is improved this morning.  Objective:    Vitals:   05/05/22 1943 05/06/22 0339 05/06/22 0500 05/06/22 0738  BP: 126/84 114/67  108/75  Pulse: 89 64  60  Resp: 16 14  16   Temp: 98.6 F (37 C) 97.9 F (36.6 C)  97.9 F (36.6 C)  TempSrc: Oral Oral  Oral  SpO2: (!) 85% 98%  98%  Weight:   70.5 kg   Height:       SpO2: 98 % O2 Flow Rate (L/min): 2 L/min  No intake or output data in the 24 hours ending 05/06/22 0850  Filed Weights   05/02/22 0500 05/05/22 0500 05/06/22 0500  Weight: 74.2 kg 77.4 kg 70.5 kg    Exam: General exam: In no acute distress. Respiratory system: Good air movement and clear to auscultation. Cardiovascular system: S1 & S2 heard, RRR. No JVD. Gastrointestinal system: Abdomen is nondistended, soft and nontender.  Extremities: No pedal edema. Skin: No rashes, lesions or ulcers Psychiatry: Judgement and insight appear normal. Mood & affect appropriate.  Data Reviewed:    Labs: Basic Metabolic Panel: Recent Labs  Lab 04/30/22 0420  NA 136  K 4.2  CL 103  CO2 24  GLUCOSE 117*  BUN 10  CREATININE 0.64  CALCIUM 9.2    GFR Estimated Creatinine Clearance: 105.3 mL/min (by C-G formula based on SCr of 0.64 mg/dL). Liver Function Tests: No results for input(s): "AST", "ALT", "ALKPHOS", "BILITOT", "PROT", "ALBUMIN" in the last 168 hours. No results for input(s): "LIPASE", "AMYLASE" in the last 168 hours. No results for input(s): "AMMONIA" in the last 168 hours. Coagulation profile No results for input(s): "INR", "PROTIME" in the last 168 hours. COVID-19 Labs  No results for input(s): "DDIMER", "FERRITIN", "LDH", "CRP" in the last 72 hours.  Lab Results  Component Value Date   SARSCOV2NAA NEGATIVE 05/28/2021   SARSCOV2NAA POSITIVE (A) 01/31/2021   [place] NEGATIVE  08/23/2020    CBC: No results for input(s): "WBC", "NEUTROABS", "HGB", "HCT", "MCV", "PLT" in the last 168 hours.  Cardiac Enzymes: No results for input(s): "CKTOTAL", "CKMB", "CKMBINDEX", "TROPONINI" in the last 168 hours. BNP (last 3 results) No results for input(s): "PROBNP" in the last 8760 hours. CBG: Recent Labs  Lab 05/02/22 1146 05/02/22 1430 05/02/22 1750 05/03/22 0631 05/03/22 1333  GLUCAP 134* 140* 111* 239* 121*    D-Dimer: No results for input(s): "DDIMER" in the last 72 hours. Hgb A1c: Recent Labs    05/03/22 1219  HGBA1C 7.1*    Lipid Profile: No results for input(s): "CHOL", "HDL", "LDLCALC", "TRIG", "CHOLHDL", "LDLDIRECT" in the last 72 hours. Thyroid function studies: No results for input(s): "TSH", "T4TOTAL", "T3FREE", "THYROIDAB" in the last 72 hours.  Invalid input(s): "FREET3" Anemia work up: No results for input(s): "VITAMINB12", "FOLATE", "FERRITIN", "TIBC", "IRON", "RETICCTPCT" in the last 72 hours. Sepsis Labs: No results for input(s): "PROCALCITON", "WBC", "LATICACIDVEN" in the last 168 hours.  Microbiology Recent Results (from the past 240 hour(s))  Surgical pcr screen  Status: None   Collection Time: 05/02/22 10:33 AM   Specimen: Nasal Mucosa; Nasal Swab  Result Value Ref Range Status   MRSA, PCR NEGATIVE NEGATIVE Final   Staphylococcus aureus NEGATIVE NEGATIVE Final    Comment: (NOTE) The Xpert SA Assay (FDA approved for NASAL specimens in patients 51 years of age and older), is one component of a comprehensive surveillance program. It is not intended to diagnose infection nor to guide or monitor treatment. Performed at [facility], [address], [place], [place] [address]   Aerobic/Anaerobic Culture w Gram Stain (surgical/deep wound)     Status: None (Preliminary result)   Collection Time: 05/02/22  4:41 PM   Specimen: PATH Soft tissue  Result Value Ref Range Status   Specimen Description TISSUE  Final   Special  Requests SQ TISSUE  Final   Gram Stain NO WBC SEEN NO ORGANISMS SEEN   Final   Culture   Final    NO GROWTH 3 DAYS NO ANAEROBES ISOLATED; CULTURE IN PROGRESS FOR 5 DAYS Performed at [facility], [address] [place], [place] [address]    Report Status PENDING  Incomplete  Aerobic/Anaerobic Culture w Gram Stain (surgical/deep wound)     Status: None (Preliminary result)   Collection Time: 05/02/22  5:18 PM   Specimen: PATH Soft tissue  Result Value Ref Range Status   Specimen Description TISSUE  Final   Special Requests THORACIC 9 AND 10 LAMINECTOMY  Final   Gram Stain NO WBC SEEN NO ORGANISMS SEEN   Final   Culture   Final    NO GROWTH 3 DAYS NO ANAEROBES ISOLATED; CULTURE IN PROGRESS FOR 5 DAYS Performed at [facility], [address] [place], [place] [address]    Report Status PENDING  Incomplete     Medications:    baclofen  5 mg Oral TID   Chlorhexidine Gluconate Cloth  6 each Topical Daily   doxycycline  100 mg Oral Q12H   gabapentin  400 mg Oral BID   insulin aspart  0-15 Units Subcutaneous TID WC   insulin aspart  0-5 Units Subcutaneous QHS   insulin detemir  5 Units Subcutaneous BID   morphine  45 mg Oral Q12H   sodium chloride flush  10-40 mL Intracatheter Q12H   Continuous Infusions:  sodium chloride Stopped (04/23/22 1631)   ceFEPime (MAXIPIME) IV 2 g (05/06/22 0536)      LOS: 14 days   Marinda Elk  [facility]  05/06/2022, 8:50 AM

## 2022-05-07 LAB — AEROBIC/ANAEROBIC CULTURE W GRAM STAIN (SURGICAL/DEEP WOUND)
Culture: NO GROWTH
Culture: NO GROWTH
Gram Stain: NONE SEEN
Gram Stain: NONE SEEN

## 2022-05-07 NOTE — Progress Notes (Signed)
Inpatient Rehab Admissions Coordinator:    I continue to await insurance auth for CIR. I will follow for potential admit pending insurance auth.   Oliviya Gilkison, MS, CCC-SLP Rehab Admissions Coordinator  [phone] (celll) [phone] (office)  

## 2022-05-07 NOTE — Progress Notes (Signed)
Physical Therapy Treatment Patient Details Name: John Ware MRN: [id number] DOB: [birth year] Today's Date: 05/07/2022   History of Present Illness Pt is a 56 y/o male presenting on 8/28 with progressive back pain. Admitted with osteomyelitis. S/P T10-11 disc aspiration via laminectomy 9/7, Noted prolonged hopsitalization in 10/22 due to MSSA bacteremia and tricuspid endocarditis. PMH includes: tricuspid valve endocarditis, discitits and osteomyelitiis, DM2, chronic diastolic heart failure.    PT Comments    Pt continues with 8/10 pain however has demonstrated improved ambulation tolerance despite impaired sensation, proprioception and weakness t/o LEs, L worse than R. Focused on L LE kinematics during ambulation ie. Pronation when foot flat, L LE knee control. Pt to continue to benefit greatly from AIR upon d/c for maximal functional recovery. Acute PT to cont to follow.    Recommendations for follow up therapy are one component of a multi-disciplinary discharge planning process, led by the attending physician.  Recommendations may be updated based on patient status, additional functional criteria and insurance authorization.  Follow Up Recommendations  Acute inpatient rehab (3hours/day)     Assistance Recommended at Discharge Frequent or constant Supervision/Assistance  Patient can return home with the following A little help with bathing/dressing/bathroom;A lot of help with walking and/or transfers;Assistance with cooking/housework;Help with stairs or ramp for entrance;Assist for transportation   Equipment Recommendations  None recommended by PT    Recommendations for Other Services Rehab consult     Precautions / Restrictions Precautions Precautions: Back;Fall Precaution Booklet Issued: No Precaution Comments: suggested log rolling for pain management Required Braces or Orthoses:  (no brace per MD order) Restrictions Weight Bearing Restrictions: No     Mobility  Bed  Mobility Overal bed mobility: Needs Assistance Bed Mobility: Sidelying to Sit, Rolling Rolling: Modified independent (Device/Increase time) Sidelying to sit: HOB elevated, Modified independent (Device/Increase time)       General bed mobility comments: encouraged log roll however pt brough self up into long sit without use of bed rail, HOB elevated    Transfers Overall transfer level: Needs assistance Equipment used: Rolling walker (2 wheels) Transfers: Sit to/from Stand Sit to Stand: Min assist   Step pivot transfers: Min guard       General transfer comment: minA for safety and stability during transition of hands from bed to RW    Ambulation/Gait Ambulation/Gait assistance: Mod assist, +2 safety/equipment Gait Distance (Feet): 25 Feet (x1, 30x1) Assistive device: Rolling walker (2 wheels) Gait Pattern/deviations: Step-to pattern, Ataxic, Narrow base of support Gait velocity: reduced     General Gait Details: pt with improved L LE ataxia today however continues to have L knee hyperextension and foot supination requiring max verbal cues to correct, pt with minimal sensation t/o LEs therefore has to look down for proprioception, pt very dependent on UE support, increased endurance today   Stairs             Wheelchair Mobility    Modified Rankin (Stroke Patients Only)       Balance Overall balance assessment: Needs assistance Sitting-balance support: No upper extremity supported, Feet supported Sitting balance-[name]: Good Sitting balance - Comments: pt able to use urinal at EOB withtout difficulty or assist   Standing balance support: Bilateral upper extremity supported, Reliant on assistive device for balance Standing balance-[name]: Poor Standing balance comment: reliant on external support                            Cognition  Arousal/Alertness: Awake/alert Behavior During Therapy: WFL for tasks assessed/performed Overall  Cognitive Status: Within Functional Limits for tasks assessed                                 General Comments: pt aware of deficits, is particular about care, suspect due to fear of falling and increased pain        Exercises      General Comments General comments (skin integrity, edema, etc.): VSS on RA      Pertinent Vitals/Pain Pain Assessment Pain Assessment: 0-10 Faces Pain Scale: Hurts whole lot    Home Living                          Prior Function            PT Goals (current goals can now be found in the care plan section) Acute Rehab PT Goals Patient Stated Goal: to return to independent PT Goal Formulation: With patient Time For Goal Achievement: 05/17/22 Potential to Achieve Goals: Good Progress towards PT goals: Progressing toward goals    Frequency    Min 5X/week      PT Plan Current plan remains appropriate    Co-evaluation              AM-PAC PT "6 Clicks" Mobility   Outcome Measure  Help needed turning from your back to your side while in a flat bed without using bedrails?: None Help needed moving from lying on your back to sitting on the side of a flat bed without using bedrails?: A Little Help needed moving to and from a bed to a chair (including a wheelchair)?: A Little Help needed standing up from a chair using your arms (e.g., wheelchair or bedside chair)?: A Little Help needed to walk in hospital room?: A Lot Help needed climbing 3-5 steps with a railing? : Total 6 Click Score: 16    End of Session Equipment Utilized During Treatment: Gait belt Activity Tolerance: Patient tolerated treatment well Patient left: in chair;with call bell/phone within reach (pt refused to have chair alarm put on stating "I already have enough pain.  I don't want to induce more by falling. I'm not 56yo, I know what I need to do.") Nurse Communication: Mobility status PT Visit Diagnosis: Other abnormalities of gait and  mobility (R26.89);Muscle weakness (generalized) (M62.81)     Time: 5638-7564 PT Time Calculation (min) (ACUTE ONLY): 43 min  Charges:  $Gait Training: 8-22 mins $Therapeutic Activity: 8-22 mins $Neuromuscular Re-education: 8-22 mins                     Lewis Shock, PT, DPT Acute Rehabilitation Services Secure chat preferred Office #: [phone]    Iona Hansen 05/07/2022, 11:04 AM

## 2022-05-07 NOTE — Progress Notes (Signed)
Mobility Specialist: Progress Note   05/07/22 1143  Mobility  Activity Refused mobility   Pt refused mobility secondary to pain. Attempted to see pt x3. Will f/u as able.  [facility] Harless Molinari Mobility Specialist Mobility Specialist [address]: [phone]

## 2022-05-07 NOTE — Plan of Care (Signed)
  Problem: Education: Goal: Ability to describe self-care measures that may prevent or decrease complications (Diabetes Survival Skills Education) will improve Outcome: Progressing Goal: Individualized Educational Video(s) Outcome: Progressing   Problem: Coping: Goal: Ability to adjust to condition or change in health will improve Outcome: Progressing   Problem: Fluid Volume: Goal: Ability to maintain a balanced intake and output will improve Outcome: Progressing   Problem: Health Behavior/Discharge Planning: Goal: Ability to identify and utilize available resources and services will improve Outcome: Progressing Goal: Ability to manage health-related needs will improve Outcome: Progressing   Problem: Metabolic: Goal: Ability to maintain appropriate glucose levels will improve Outcome: Progressing   Problem: Nutritional: Goal: Maintenance of adequate nutrition will improve Outcome: Progressing Goal: Progress toward achieving an optimal weight will improve Outcome: Progressing   Problem: Skin Integrity: Goal: Risk for impaired skin integrity will decrease Outcome: Progressing   Problem: Tissue Perfusion: Goal: Adequacy of tissue perfusion will improve Outcome: Progressing   Problem: Education: Goal: Knowledge of General Education information will improve Description: Including pain rating scale, medication(s)/side effects and non-pharmacologic comfort measures Outcome: Progressing   Problem: Health Behavior/Discharge Planning: Goal: Ability to manage health-related needs will improve Outcome: Progressing   Problem: Clinical Measurements: Goal: Ability to maintain clinical measurements within normal limits will improve Outcome: Progressing Goal: Will remain free from infection Outcome: Progressing Goal: Diagnostic test results will improve Outcome: Progressing Goal: Respiratory complications will improve Outcome: Progressing Goal: Cardiovascular complication will  be avoided Outcome: Progressing   Problem: Activity: Goal: Risk for activity intolerance will decrease Outcome: Progressing   Problem: Nutrition: Goal: Adequate nutrition will be maintained Outcome: Progressing   Problem: Coping: Goal: Level of anxiety will decrease Outcome: Progressing   Problem: Elimination: Goal: Will not experience complications related to bowel motility Outcome: Progressing Goal: Will not experience complications related to urinary retention Outcome: Progressing   Problem: Pain Managment: Goal: General experience of comfort will improve Outcome: Progressing   Problem: Safety: Goal: Ability to remain free from injury will improve Outcome: Progressing   Problem: Skin Integrity: Goal: Risk for impaired skin integrity will decrease Outcome: Progressing   Problem: Education: Goal: Ability to verbalize activity precautions or restrictions will improve Outcome: Progressing Goal: Knowledge of the prescribed therapeutic regimen will improve Outcome: Progressing Goal: Understanding of discharge needs will improve Outcome: Progressing   Problem: Activity: Goal: Ability to avoid complications of mobility impairment will improve Outcome: Progressing Goal: Ability to tolerate increased activity will improve Outcome: Progressing Goal: Will remain free from falls Outcome: Progressing   Problem: Bowel/Gastric: Goal: Gastrointestinal status for postoperative course will improve Outcome: Progressing   Problem: Clinical Measurements: Goal: Ability to maintain clinical measurements within normal limits will improve Outcome: Progressing Goal: Postoperative complications will be avoided or minimized Outcome: Progressing Goal: Diagnostic test results will improve Outcome: Progressing   Problem: Pain Management: Goal: Pain level will decrease Outcome: Progressing   Problem: Skin Integrity: Goal: Will show signs of wound healing Outcome: Progressing    Problem: Health Behavior/Discharge Planning: Goal: Identification of resources available to assist in meeting health care needs will improve Outcome: Progressing   Problem: Bladder/Genitourinary: Goal: Urinary functional status for postoperative course will improve Outcome: Progressing   

## 2022-05-08 ENCOUNTER — Other Ambulatory Visit: Payer: Self-pay

## 2022-05-08 ENCOUNTER — Encounter (HOSPITAL_COMMUNITY): Payer: Self-pay | Admitting: Physical Medicine and Rehabilitation

## 2022-05-08 ENCOUNTER — Inpatient Hospital Stay (HOSPITAL_COMMUNITY)
Admission: RE | Admit: 2022-05-08 | Discharge: 2022-05-17 | DRG: 637 | Disposition: A | Discharge status: Home Health | Payer: Commercial Managed Care - PPO | Source: Intra-hospital | Attending: Physical Medicine and Rehabilitation | Admitting: Physical Medicine and Rehabilitation

## 2022-05-08 DIAGNOSIS — R270 Ataxia, unspecified: Secondary | ICD-10-CM | POA: Diagnosis present

## 2022-05-08 DIAGNOSIS — Z741 Need for assistance with personal care: Secondary | ICD-10-CM | POA: Diagnosis present

## 2022-05-08 DIAGNOSIS — B9562 Methicillin resistant Staphylococcus aureus infection as the cause of diseases classified elsewhere: Secondary | ICD-10-CM | POA: Diagnosis present

## 2022-05-08 DIAGNOSIS — I110 Hypertensive heart disease with heart failure: Secondary | ICD-10-CM | POA: Diagnosis present

## 2022-05-08 DIAGNOSIS — E1142 Type 2 diabetes mellitus with diabetic polyneuropathy: Secondary | ICD-10-CM | POA: Diagnosis present

## 2022-05-08 DIAGNOSIS — L299 Pruritus, unspecified: Secondary | ICD-10-CM | POA: Diagnosis not present

## 2022-05-08 DIAGNOSIS — I5032 Chronic diastolic (congestive) heart failure: Secondary | ICD-10-CM | POA: Diagnosis present

## 2022-05-08 DIAGNOSIS — Z87891 Personal history of nicotine dependence: Secondary | ICD-10-CM

## 2022-05-08 DIAGNOSIS — E119 Type 2 diabetes mellitus without complications: Secondary | ICD-10-CM | POA: Diagnosis not present

## 2022-05-08 DIAGNOSIS — Z6822 Body mass index (BMI) 22.0-22.9, adult: Secondary | ICD-10-CM | POA: Diagnosis not present

## 2022-05-08 DIAGNOSIS — M009 Pyogenic arthritis, unspecified: Secondary | ICD-10-CM | POA: Diagnosis present

## 2022-05-08 DIAGNOSIS — T8469XD Infection and inflammatory reaction due to internal fixation device of other site, subsequent encounter: Secondary | ICD-10-CM | POA: Diagnosis not present

## 2022-05-08 DIAGNOSIS — R64 Cachexia: Secondary | ICD-10-CM | POA: Diagnosis present

## 2022-05-08 DIAGNOSIS — G061 Intraspinal abscess and granuloma: Secondary | ICD-10-CM | POA: Diagnosis present

## 2022-05-08 DIAGNOSIS — L740 Miliaria rubra: Secondary | ICD-10-CM | POA: Diagnosis present

## 2022-05-08 DIAGNOSIS — I10 Essential (primary) hypertension: Secondary | ICD-10-CM | POA: Diagnosis present

## 2022-05-08 DIAGNOSIS — R7881 Bacteremia: Secondary | ICD-10-CM | POA: Diagnosis present

## 2022-05-08 DIAGNOSIS — Z9889 Other specified postprocedural states: Secondary | ICD-10-CM

## 2022-05-08 DIAGNOSIS — M545 Low back pain, unspecified: Secondary | ICD-10-CM | POA: Diagnosis not present

## 2022-05-08 DIAGNOSIS — Z8616 Personal history of COVID-19: Secondary | ICD-10-CM

## 2022-05-08 DIAGNOSIS — F419 Anxiety disorder, unspecified: Secondary | ICD-10-CM | POA: Diagnosis present

## 2022-05-08 DIAGNOSIS — K6812 Psoas muscle abscess: Secondary | ICD-10-CM | POA: Diagnosis present

## 2022-05-08 DIAGNOSIS — E785 Hyperlipidemia, unspecified: Secondary | ICD-10-CM | POA: Diagnosis present

## 2022-05-08 DIAGNOSIS — Z8659 Personal history of other mental and behavioral disorders: Secondary | ICD-10-CM | POA: Diagnosis not present

## 2022-05-08 DIAGNOSIS — E1169 Type 2 diabetes mellitus with other specified complication: Principal | ICD-10-CM | POA: Diagnosis present

## 2022-05-08 DIAGNOSIS — Y838 Other surgical procedures as the cause of abnormal reaction of the patient, or of later complication, without mention of misadventure at the time of the procedure: Secondary | ICD-10-CM | POA: Diagnosis present

## 2022-05-08 DIAGNOSIS — M792 Neuralgia and neuritis, unspecified: Secondary | ICD-10-CM | POA: Diagnosis not present

## 2022-05-08 DIAGNOSIS — F32A Depression, unspecified: Secondary | ICD-10-CM | POA: Diagnosis present

## 2022-05-08 DIAGNOSIS — M4644 Discitis, unspecified, thoracic region: Secondary | ICD-10-CM | POA: Diagnosis present

## 2022-05-08 DIAGNOSIS — G8929 Other chronic pain: Secondary | ICD-10-CM | POA: Diagnosis present

## 2022-05-08 DIAGNOSIS — Z888 Allergy status to other drugs, medicaments and biological substances status: Secondary | ICD-10-CM

## 2022-05-08 DIAGNOSIS — D72829 Elevated white blood cell count, unspecified: Secondary | ICD-10-CM | POA: Diagnosis not present

## 2022-05-08 DIAGNOSIS — D649 Anemia, unspecified: Secondary | ICD-10-CM | POA: Diagnosis not present

## 2022-05-08 DIAGNOSIS — M4626 Osteomyelitis of vertebra, lumbar region: Secondary | ICD-10-CM | POA: Diagnosis present

## 2022-05-08 DIAGNOSIS — K5900 Constipation, unspecified: Secondary | ICD-10-CM | POA: Diagnosis not present

## 2022-05-08 DIAGNOSIS — M25512 Pain in left shoulder: Secondary | ICD-10-CM | POA: Diagnosis present

## 2022-05-08 MED ORDER — SODIUM CHLORIDE 0.9 % IV SOLN
2.0000 g | Freq: Three times a day (TID) | INTRAVENOUS | Status: DC
  Administered 2022-05-08 – 2022-05-17 (×26): 2 g via INTRAVENOUS
  Filled 2022-05-08 (×28): qty 12.5

## 2022-05-08 MED ORDER — ALUM & MAG HYDROXIDE-SIMETH 200-200-20 MG/5ML PO SUSP: 30.0000 mL | ORAL | Status: DC | PRN

## 2022-05-08 MED ORDER — DIPHENHYDRAMINE HCL 12.5 MG/5ML PO ELIX: 12.5000 mg | ORAL_SOLUTION | Freq: Four times a day (QID) | ORAL | Status: DC | PRN

## 2022-05-08 MED ORDER — SODIUM CHLORIDE 0.9% FLUSH
10.0000 mL | Freq: Two times a day (BID) | INTRAVENOUS | Status: DC
  Administered 2022-05-08 – 2022-05-16 (×14): 10 mL

## 2022-05-08 MED ORDER — INSULIN ASPART 100 UNIT/ML IJ SOLN: 0.0000 [IU] | Freq: Three times a day (TID) | INTRAMUSCULAR | Status: DC

## 2022-05-08 MED ORDER — GERHARDT'S BUTT CREAM
TOPICAL_CREAM | Freq: Four times a day (QID) | CUTANEOUS | Status: DC
  Administered 2022-05-15 – 2022-05-16 (×2): 1 via TOPICAL
  Filled 2022-05-08 (×2): qty 1

## 2022-05-08 MED ORDER — GABAPENTIN 400 MG PO CAPS
400.0000 mg | ORAL_CAPSULE | Freq: Two times a day (BID) | ORAL | Status: DC
  Administered 2022-05-08 – 2022-05-17 (×18): 400 mg via ORAL
  Filled 2022-05-08 (×18): qty 1

## 2022-05-08 MED ORDER — BISACODYL 10 MG RE SUPP: 10.0000 mg | Freq: Every day | RECTAL | Status: DC | PRN

## 2022-05-08 MED ORDER — OXYCODONE-ACETAMINOPHEN 5-325 MG PO TABS
2.0000 | ORAL_TABLET | Freq: Four times a day (QID) | ORAL | Status: DC | PRN
  Administered 2022-05-08 – 2022-05-17 (×30): 2 via ORAL
  Filled 2022-05-08 (×32): qty 2

## 2022-05-08 MED ORDER — PROCHLORPERAZINE MALEATE 5 MG PO TABS: 5.0000 mg | ORAL_TABLET | Freq: Four times a day (QID) | ORAL | Status: DC | PRN

## 2022-05-08 MED ORDER — TRAZODONE HCL 50 MG PO TABS
25.0000 mg | ORAL_TABLET | Freq: Every evening | ORAL | Status: DC | PRN
  Filled 2022-05-08 (×2): qty 1

## 2022-05-08 MED ORDER — NALOXONE HCL 0.4 MG/ML IJ SOLN: 0.4000 mg | INTRAMUSCULAR | Status: DC | PRN

## 2022-05-08 MED ORDER — SODIUM CHLORIDE 0.9% FLUSH
10.0000 mL | INTRAVENOUS | Status: DC | PRN
  Administered 2022-05-09: 10 mL

## 2022-05-08 MED ORDER — DOXYCYCLINE HYCLATE 100 MG PO TABS
100.0000 mg | ORAL_TABLET | Freq: Two times a day (BID) | ORAL | Status: DC
  Administered 2022-05-08 – 2022-05-17 (×18): 100 mg via ORAL
  Filled 2022-05-08 (×18): qty 1

## 2022-05-08 MED ORDER — INSULIN DETEMIR 100 UNIT/ML ~~LOC~~ SOLN
5.0000 [IU] | Freq: Two times a day (BID) | SUBCUTANEOUS | Status: DC
  Filled 2022-05-08 (×2): qty 0.05

## 2022-05-08 MED ORDER — CHLORHEXIDINE GLUCONATE CLOTH 2 % EX PADS
6.0000 | MEDICATED_PAD | Freq: Every day | CUTANEOUS | Status: DC
  Administered 2022-05-09: 6 via TOPICAL

## 2022-05-08 MED ORDER — ACETAMINOPHEN 325 MG PO TABS
325.0000 mg | ORAL_TABLET | ORAL | Status: DC | PRN
  Administered 2022-05-10: 650 mg via ORAL
  Filled 2022-05-08: qty 2

## 2022-05-08 MED ORDER — POLYETHYLENE GLYCOL 3350 17 G PO PACK: 17.0000 g | PACK | Freq: Every day | ORAL | Status: DC | PRN

## 2022-05-08 MED ORDER — GUAIFENESIN-DM 100-10 MG/5ML PO SYRP: 5.0000 mL | ORAL_SOLUTION | Freq: Four times a day (QID) | ORAL | Status: DC | PRN

## 2022-05-08 MED ORDER — INSULIN ASPART 100 UNIT/ML IJ SOLN: 0.0000 [IU] | Freq: Every day | INTRAMUSCULAR | Status: DC

## 2022-05-08 MED ORDER — BACLOFEN 5 MG HALF TABLET
5.0000 mg | ORAL_TABLET | Freq: Three times a day (TID) | ORAL | Status: DC
  Administered 2022-05-08 – 2022-05-17 (×26): 5 mg via ORAL
  Filled 2022-05-08 (×26): qty 1

## 2022-05-08 MED ORDER — FLEET ENEMA 7-19 GM/118ML RE ENEM: 1.0000 | ENEMA | Freq: Once | RECTAL | Status: DC | PRN

## 2022-05-08 MED ORDER — PROCHLORPERAZINE 25 MG RE SUPP: 12.5000 mg | Freq: Four times a day (QID) | RECTAL | Status: DC | PRN

## 2022-05-08 MED ORDER — MORPHINE SULFATE ER 15 MG PO TBCR
45.0000 mg | EXTENDED_RELEASE_TABLET | Freq: Two times a day (BID) | ORAL | Status: DC
  Administered 2022-05-08 – 2022-05-09 (×2): 45 mg via ORAL
  Filled 2022-05-08 (×2): qty 3

## 2022-05-08 MED ORDER — LIVING WELL WITH DIABETES BOOK
Freq: Once | Status: AC
  Filled 2022-05-08: qty 1

## 2022-05-08 MED ORDER — PROCHLORPERAZINE EDISYLATE 10 MG/2ML IJ SOLN: 5.0000 mg | Freq: Four times a day (QID) | INTRAMUSCULAR | Status: DC | PRN

## 2022-05-08 MED ORDER — HYDROXYZINE HCL 10 MG PO TABS
10.0000 mg | ORAL_TABLET | Freq: Three times a day (TID) | ORAL | Status: DC | PRN
  Administered 2022-05-08: 10 mg via ORAL
  Filled 2022-05-08 (×2): qty 1

## 2022-05-08 NOTE — Progress Notes (Signed)
Physical Therapy Treatment Patient Details Name: John Ware MRN: [id number] DOB: December 12, 1965 Today's Date: 05/08/2022   History of Present Illness Pt is a 56 y/o male presenting on 8/28 with progressive back pain. Admitted with osteomyelitis. S/P T10-11 disc aspiration via laminectomy 9/7, Noted prolonged hopsitalization in 10/22 due to MSSA bacteremia and tricuspid endocarditis. PMH includes: tricuspid valve endocarditis, discitits and osteomyelitiis, DM2, chronic diastolic heart failure.    PT Comments    Pt continues with constant back pain limiting standing tolerance and ambulation ability however continues to improve overall daily. Pt with decreased L LE ataxia and increased ability to control L foot during swing phase. Pt continues to reports minimal sensation t/o bilat LEs requiring pt to look at his feet to advance them. Acute PT to cont to follow. Continue to recommend AIR Upon d/c for maximal functional recovery.    Recommendations for follow up therapy are one component of a multi-disciplinary discharge planning process, led by the attending physician.  Recommendations may be updated based on patient status, additional functional criteria and insurance authorization.  Follow Up Recommendations  Acute inpatient rehab (3hours/day)     Assistance Recommended at Discharge Frequent or constant Supervision/Assistance  Patient can return home with the following A little help with bathing/dressing/bathroom;A lot of help with walking and/or transfers;Assistance with cooking/housework;Help with stairs or ramp for entrance;Assist for transportation   Equipment Recommendations  None recommended by PT    Recommendations for Other Services Rehab consult     Precautions / Restrictions Precautions Precautions: Back;Fall Precaution Booklet Issued: No Precaution Comments: suggested log rolling for pain management Required Braces or Orthoses:  (no brace per MD  order) Restrictions Weight Bearing Restrictions: No     Mobility  Bed Mobility Overal bed mobility: Needs Assistance Bed Mobility: Sidelying to Sit, Rolling Rolling: Modified independent (Device/Increase time) Sidelying to sit: HOB elevated, Modified independent (Device/Increase time)       General bed mobility comments: encouraged log roll however pt brough self up into long sit without use of bed rail, HOB elevated    Transfers Overall transfer level: Needs assistance Equipment used: Rolling walker (2 wheels) Transfers: Sit to/from Stand Sit to Stand: Min assist           General transfer comment: minA for safety and stability during transition of hands from bed to RW    Ambulation/Gait Ambulation/Gait assistance: Mod assist Gait Distance (Feet): 20 Feet (x1, 52' x 1, 15'x1) Assistive device: Rolling walker (2 wheels) Gait Pattern/deviations: Step-to pattern, Ataxic, Narrow base of support Gait velocity: reduced Gait velocity interpretation: <1.31 ft/sec, indicative of household ambulator   General Gait Details: pt with improved L LE ataxia today and ability to achieve foot flat. Pt continues with over all decreased stride length and height however R LE with less stride length on the R compared to the L. Pt with L knee in hyperextension, mild R knee flexion with improved kinematics compared to L LE during swing phase. pt remains to be very dependent on bilat UEs during ambulation   Stairs             Wheelchair Mobility    Modified Rankin (Stroke Patients Only)       Balance Overall balance assessment: Needs assistance Sitting-balance support: No upper extremity supported, Feet supported Sitting balance-[name]: Good Sitting balance - Comments: pt able to use urinal at EOB without difficulty or assist   Standing balance support: Bilateral upper extremity supported, Reliant on assistive device for balance Standing  balance-[name]: Poor Standing  balance comment: reliant on external support                            Cognition Arousal/Alertness: Awake/alert Behavior During Therapy: WFL for tasks assessed/performed Overall Cognitive Status: Within Functional Limits for tasks assessed                                 General Comments: pt aware of deficits, is particular about care, suspect due to fear of falling and increased pain        Exercises      General Comments General comments (skin integrity, edema, etc.): VSS on RA      Pertinent Vitals/Pain Pain Assessment Pain Assessment: 0-10 Pain Score: 9  Pain Location: back Pain Descriptors / Indicators: Sharp Pain Intervention(s): Monitored during session    Home Living                          Prior Function            PT Goals (current goals can now be found in the care plan section) Acute Rehab PT Goals Patient Stated Goal: to return to independent PT Goal Formulation: With patient Time For Goal Achievement: 05/17/22 Potential to Achieve Goals: Good Progress towards PT goals: Progressing toward goals    Frequency    Min 5X/week      PT Plan Current plan remains appropriate    Co-evaluation              AM-PAC PT "6 Clicks" Mobility   Outcome Measure  Help needed turning from your back to your side while in a flat bed without using bedrails?: None Help needed moving from lying on your back to sitting on the side of a flat bed without using bedrails?: A Little Help needed moving to and from a bed to a chair (including a wheelchair)?: A Little Help needed standing up from a chair using your arms (e.g., wheelchair or bedside chair)?: A Little Help needed to walk in hospital room?: A Lot Help needed climbing 3-5 steps with a railing? : Total 6 Click Score: 16    End of Session Equipment Utilized During Treatment: Gait belt Activity Tolerance: Patient tolerated treatment well Patient left: in chair;with  call bell/phone within reach (pt refused to have chair alarm put on stating "I already have enough pain.  I don't want to induce more by falling. I'm not 56yo, I know what I need to do.") Nurse Communication: Mobility status PT Visit Diagnosis: Other abnormalities of gait and mobility (R26.89);Muscle weakness (generalized) (M62.81)     Time: 4196-2229 PT Time Calculation (min) (ACUTE ONLY): 31 min  Charges:  $Gait Training: 23-37 mins                     Lewis Shock, PT, DPT Acute Rehabilitation Services Secure chat preferred Office #: [phone]    Iona Hansen 05/08/2022, 10:45 AM

## 2022-05-08 NOTE — H&P (Signed)
Physical Medicine and Rehabilitation Admission H&P    Chief Complaint  Patient presents with   Functional deficits secondary to    Recurrent osteomyelitis    HPI:  Collen L. 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  He was admitted on 04/22/22 with inability to walk, BLE numbness, intermittent fevers  and urinary incontinence X 3 weeks. He was found to have  bacteremia with acute discitis T9/10- T11/T12 with epidural phlegmon/abscess, septic arthritis T11-T12, extensive enhancement paraspinal soft tissue, extensive enhancement soft tissue T2-T7, obliteration of disc space w/retrolisthesis-T4,  with probable soft tissue abscess 2.4 X 1.1 X 2.9 cm soft tissue abscess. He was started on IV antibiotics by Dr. Daiva Eves and IR consulted for aspiration per Dr. Verlee Rossetti input. He was taken to OR on 09/07 for I and D of SQ abscess decompression and debridement of osteomyelitis via T10-T11 laminectomy.   Wound culture positive pseudomonas and blood cultures positive for MRSA/staph epidermidis. ID recommends 6 weeks IV cefepime w/end date 10/19 and oral doxycycline indefinitely for hardware associated staph infection. He continues to be limited by pain as well as weakness Left > Right with ataxia as well sensory deficits affecting mobility and ADLs. CIR recommended due to functional decline.    Review of Systems  Constitutional: Negative.   HENT: Negative.    Eyes: Negative.   Cardiovascular: Negative.   Gastrointestinal: Negative.   Genitourinary: Negative.   Musculoskeletal:  Positive for back pain.  Skin: Negative.   Psychiatric/Behavioral:  Positive for depression.      Past Medical History:  Diagnosis Date   Depression    Diabetes mellitus without complication (HCC)     Hypertension    Spinal stenosis    With Neurogenic Claudication    Past Surgical History:  Procedure Laterality Date   CARPAL TUNNEL RELEASE Bilateral    INCISION AND DRAINAGE ABSCESS Left 03/12/2021   Procedure: INCISION AND DRAINAGE ABSCESS;  Surgeon: [name], MD;  Location: [facility];  Service: Orthopedics;  Laterality: Left;   IR FLUORO GUIDED NEEDLE PLC ASPIRATION/INJECTION LOC  04/24/2022   IR US GUIDE BX ASP/DRAIN  02/02/2021   IRRIGATION AND DEBRIDEMENT ABSCESS Left 02/08/2021   Procedure: MINOR INCISION AND DRAINAGE OF ABSCESS;  Surgeon: [name], MD;  Location: [facility];  Service: Orthopedics;  Laterality: Left;   IRRIGATION AND DEBRIDEMENT ABSCESS Left 02/10/2021   Procedure: IRRIGATION AND DEBRIDEMENT ABSCESS LEFT LATERAL CHEST WALL;  Surgeon: [name], MD;  Location: [facility];  Service: General;  Laterality: Left;   IRRIGATION AND DEBRIDEMENT ABSCESS Bilateral 02/20/2021   Procedure: MINOR INCISION AND DRAINAGE OF ABSCESS/WITH BILATERAL WOUND VAC PLACEMENT;  Surgeon: [name], MD;  Location: [facility];  Service: Orthopedics;  Laterality: Bilateral;   IRRIGATION AND DEBRIDEMENT SHOULDER Bilateral 02/28/2021   Procedure: IRRIGATION AND DEBRIDEMENT SHOULDER;  Surgeon: [name], MD;  Location: [facility];  Service: Orthopedics;  Laterality: Bilateral;   IRRIGATION AND DEBRIDEMENT SHOULDER Left 03/06/2021   Procedure: IRRIGATION AND DEBRIDEMENT SHOULDER;  Surgeon: [name], MD;  Location: [facility];  Service: Orthopedics;  Laterality: Left;   IRRIGATION AND DEBRIDEMENT SHOULDER Left 03/08/2021   Procedure: IRRIGATION AND DEBRIDEMENT SHOULDER;  Surgeon: [name], MD;  Location: [facility];  Service: Orthopedics;  Laterality: Left;   LUMBAR LAMINECTOMY FOR EPIDURAL ABSCESS N/A  05/02/2022   Procedure: THORACIC  LAMINECTOMY AND DEBRIDEMENT  FOR EPIDURAL ABSCESS;  Surgeon: [name], MD;  Location: [facility];  Service: Neurosurgery;  Laterality: N/A;   LUMBAR  LAMINECTOMY/DECOMPRESSION MICRODISCECTOMY N/A 12/13/2014   Procedure: LUMBAR THREE-FOUR, LUMBAR FOUR-FIVE, LUMBAR FIVE-SACRAL ONE LAMINECTOMY;  Surgeon: [name], MD;  Location: [facility];  Service: Neurosurgery;  Laterality: N/A;  L3-4 L4-5 L5-S1 Laminectomy   LUMBAR LAMINECTOMY/DECOMPRESSION MICRODISCECTOMY Left 06/05/2021   Procedure: Left lumbar one-two, Lumbar two-three Laminectomy;  Surgeon: [name], MD;  Location: [facility] OR;  Service: Neurosurgery;  Laterality: Left;   MENISCUS REPAIR     Right knee   MINOR APPLICATION OF WOUND VAC Left 02/20/2021   Procedure: MINOR APPLICATION OF KERLIX PACKING/REMOVAL OF WOUND VAC;  Surgeon: [name], MD;  Location: [facility];  Service: General;  Laterality: Left;   TEE WITHOUT CARDIOVERSION N/A 02/05/2021   Procedure: TRANSESOPHAGEAL ECHOCARDIOGRAM (TEE);  Surgeon: Quintella Reichert, MD;  [facility];  Service: Cardiovascular;  Laterality: N/A;   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 His smoking use included cigarettes. He has a 30.00 pack-year smoking history. He has never used smokeless tobacco. He reports current alcohol use. He reports that he does not use drugs.    Allergies  Allergen Reactions   Tizanidine Other (See Comments)    Leg cramps    Medications Prior to Admission  Medication Sig Dispense Refill   acetaminophen (TYLENOL) 500 MG tablet Take 1 tablet (500 mg total) by mouth every 6 (six) hours as needed. 30 tablet 0   Baclofen 5 MG TABS TAKE ONE TABLET THREE TIMES DAILY (Patient taking differently: Take 1 tablet by  mouth 3 (three) times daily.) 90 tablet 5   doxycycline (VIBRA-TABS) 100 MG tablet Take 1 tablet (100 mg total) by mouth 2 (two) times daily. 60 tablet 11   gabapentin (NEURONTIN) 100 MG capsule TAKE ONE CAPSULE TWICE DAILY (Patient taking differently: Take 100 mg by mouth 2 (two) times daily.) 60 capsule 5   morphine (MS CONTIN) 15 MG 12 hr tablet Take 3 tablets (45 mg total) by mouth every 12 (twelve) hours. 180 tablet 0   oxyCODONE-acetaminophen (PERCOCET) 10-325 MG tablet Take 1 tablet by mouth every 6 (six) hours as needed (breakthrough pain). 120 tablet 0   naloxone (NARCAN) 0.4 MG/ML injection Inject 1 mL (0.4 mg total) into the vein as needed. (Patient taking differently: Inject 0.4 mg into the vein daily as needed (For overdose).) 1 mL 0   Home: Home Living Family/patient expects to be discharged to:: Private residence Living Arrangements: Spouse/significant other Available Help at Discharge: Family, Available 24 hours/day Type of Home: House Home Access: Ramped entrance Home Layout: One level Bathroom Shower/Tub: Health visitor: Handicapped height Bathroom Accessibility: Yes Home Equipment: Information systems manager, Agricultural consultant (2 wheels), [employer] - single point, Wheelchair - manual, Other (comment)  Lives With: Spouse   Functional History: Prior Function Prior Level of Function : Independent/Modified Independent Mobility Comments: progressive decline over the past 3 weeks-  progressing to use of wheelchair ADLs Comments: pt reports independent with ADLs/IADLs and driving, progressive decline with spouse assisting with ADLs over the last 3 weeks   Functional Status:  Mobility: Bed Mobility Overal bed mobility: Needs Assistance Bed Mobility: Sidelying to Sit, Rolling Rolling: Modified independent (Device/Increase time) Sidelying to sit: HOB elevated, Modified independent (Device/Increase time) Sit to sidelying: Supervision General bed mobility comments: encouraged log  roll however pt brough self up into long sit without use of bed rail, HOB elevated Transfers Overall transfer level: Needs assistance Equipment used: Rolling walker (2 wheels) Transfers: Sit to/from Stand Sit to Stand: Min assist Bed to/from chair/wheelchair/BSC transfer type:: Step pivot Step pivot transfers: Min guard  Lateral/Scoot Transfers: Min guard General transfer comment: minA for safety and stability during transition of hands from bed to RW Ambulation/Gait Ambulation/Gait assistance: Mod assist Gait Distance (Feet): 20 Feet (x1, 52' x 1, 15'x1) Assistive device: Rolling walker (2 wheels) Gait Pattern/deviations: Step-to pattern, Ataxic, Narrow base of support General Gait Details: pt with improved L LE ataxia today and ability to achieve foot flat. Pt continues with over all decreased stride length and height however R LE with less stride length on the R compared to the L. Pt with L knee in hyperextension, mild R knee flexion with improved kinematics compared to L LE during swing phase. pt remains to be very dependent on bilat UEs during ambulation Gait velocity: reduced Gait velocity interpretation: <1.31 ft/sec, indicative of household ambulator   ADL: ADL Overall ADL's : Needs assistance/impaired Grooming: Set up, Sitting Grooming Details (indicate cue type and reason): seated at sink Upper Body Bathing: Set up Upper Body Bathing Details (indicate cue type and reason): seated at sink Upper Body Dressing : Set up, Sitting Lower Body Dressing: Maximal assistance, Sitting/lateral leans Lower Body Dressing Details (indicate cue type and reason): simulated EOB Toilet Transfer: Minimal assistance, Ambulation, BSC/3in1 Toilet Transfer Details (indicate cue type and reason): simulated via functional mobility in room Functional mobility during ADLs: Min guard General ADL Comments: limited to EOB, pt declines standing or transferring today   Cognition: Cognition Overall  Cognitive Status: Within Functional Limits for tasks assessed Orientation Level: Oriented X4 Cognition Arousal/Alertness: Awake/alert Behavior During Therapy: WFL for tasks assessed/performed Overall Cognitive Status: Within Functional Limits for tasks assessed General Comments: pt aware of deficits, is particular about care, suspect due to fear of falling and increased pain    Blood pressure 126/80, pulse 75, temperature 98.2 F (36.8 C), temperature source Oral, resp. rate 17, height 5\' 10"  (1.778 m), weight 71.7 kg, SpO2 98 %.  Physical Exam Vitals and nursing note reviewed.  Constitutional:      Appearance: He is cachectic.  Cardiac: RRR Pulmonary: Breathing comfortably on room air Abdomen: NT, ND Musculoskeletal:     Comments: Left shoulder with atrophy and limited ROM. Biceps 3/5. LLE ataxic.  Otherwise with 5/5 strength. Skin:    Comments: Back incision C/D/I with two fluctuant areas (decreased per wife)  Neurological:     Mental Status: He is alert and oriented to person, place, and time. Speech is fluent   No results found for this or any previous visit (from the past 48 hour(s)). No results found.    Blood pressure 126/80, pulse 75, temperature 98.2 F (36.8 C), temperature source Oral, resp. rate 17, height 5\' 10"  (1.778 m), weight 71.7 kg, SpO2 98 %.  Medical Problem List and Plan: 1. Functional deficits secondary to osteomyelitis of lumbar spine s/p thoracic laminectomy  -patient  may shower  -ELOS/Goals: 10-14 days modI  Admit to CIR 2.  Antithrombotics: -DVT/anticoagulation:  Mechanical: Sequential compression devices, below knee Bilateral lower extremities  -antiplatelet therapy: N/A 3. Pain: continue prn oxycodone.  4. Mood/Behavior/Sleep: LCSW to follow for evaluation and support.   -antipsychotic agents: N/A 5. Neuropsych/cognition: This patient is capable of making decisions on his own behalf. 6. Skin/Wound Care: Monitor back incision for healing.     --Routine pressure relief measures.  7. Fluids/Electrolytes/Nutrition: Monitor I/O. Wife to bring food from home as does not like food her.  --Check CMET in am.  8. MRSA bacteremia: Continue Cefepime for 6 weeks w/end date 10/  --lifelong suppression w/doxycycline and cipro?  9. T2DM: Hgb A1c--7.1. Will monitor BS ac/hs- has libre and does not want finger stuck-.  Did get couple of doses of steroids with BS labile. --continue Levemir and SSI has been refused by patient-->people die in -hospital. -=He was able to get A1C down to 7 by diet and plans to continue that. ---will continue to educate on importance of BS control.  10. Leucocytosis: WBC has been ranging from 10-11 range. Repeat WBC tomorrow 11. Constipation?: No BM documented  --per patient bowels have been working and feels that NO laxative needed.    I have personally performed a face to face diagnostic evaluation, including, but not limited to relevant history and physical exam findings, of this patient and developed relevant assessment and plan.  Additionally, I have reviewed and concur with the physician assistant's documentation above.  Jacquelynn Cree, PA-C   Horton Chin, MD 05/08/2022

## 2022-05-08 NOTE — TOC Transition Note (Signed)
Transition of Care [facility]) - CM/SW Discharge Note   Patient Details  Name: John Ware MRN: [id number] Date of Birth: [birth year]  Transition of Care [facility]) CM/SW Contact:  Kermit Balo, RN Phone Number: 05/08/2022, 12:02 PM   Clinical Narrative:    Pt is discharging to CIR today. CM signing off.    Final next level of care: IP Rehab Facility Barriers to Discharge: No Barriers Identified   Patient Goals and CMS Choice   CMS Medicare.gov Compare Post Acute Care list provided to:: Patient Choice offered to / list presented to : Patient  Discharge Placement                       Discharge Plan and Services   Discharge Planning Services: CM Consult Post Acute Care Choice: IP Rehab                               Social Determinants of Health (SDOH) Interventions     Readmission Risk Interventions    02/16/2021   12:11 PM  Readmission Risk Prevention Plan  Transportation Screening Complete  PCP or Specialist Appt within 3-5 Days Complete  HRI or Home Care Consult Complete  Social Work Consult for Recovery Care Planning/Counseling Complete  Palliative Care Screening Complete  Medication Review Oceanographer) Complete

## 2022-05-08 NOTE — Progress Notes (Signed)
Inpatient Rehab Admissions Coordinator:  ° °I have a CIR bed for this Pt. Today. RN may call report to [phone]. ° °Shravya Wickwire, MS, CCC-SLP °Rehab Admissions Coordinator  °[phone] (celll) °[phone] (office) ° °

## 2022-05-08 NOTE — Progress Notes (Signed)
Patient oriented to the unit and assigned room. Assessment/Admission complete.    Tilden Dome, LPN

## 2022-05-08 NOTE — Progress Notes (Signed)
Inpatient Rehabilitation Admission Medication Review by a Pharmacist  A complete drug regimen review was completed for this patient to identify any potential clinically significant medication issues.  High Risk Drug Classes Is patient taking? Indication by Medication  Antipsychotic Yes Compazine- N/V  Anticoagulant Yes Lovenox- VTE ppx  Antibiotic Yes, as an intravenous medication Cefepime IV, Doxy PO- osteomyelitis. Cefepime >> 06/13/2022 / Doxy indefinite as suppressive therapy  Opioid Yes Percocet, MSContin- acute on chronic pain  Antiplatelet No   Hypoglycemics/insulin No   Vasoactive Medication No   Chemotherapy No   Other Yes Baclofen- muscle spasms Gabapentin- neuropathic pain     Type of Medication Issue Identified Description of Issue Recommendation(s)  Drug Interaction(s) (clinically significant)     Duplicate Therapy     Allergy     No Medication Administration End Date     Incorrect Dose     Additional Drug Therapy Needed     Significant med changes from prior encounter (inform family/care partners about these prior to discharge).    Other  PTA meds: All have been re-ordered for use on CIR NO action necessary    Clinically significant medication issues were identified that warrant physician communication and completion of prescribed/recommended actions by midnight of the next day:  No  Name of provider notified for urgent issues identified:   Provider Method of Notification:     Pharmacist comments:   Time spent performing this drug regimen review (minutes):  30   Earle Reome BS, PharmD, BCPS Clinical Pharmacist 05/08/2022 11:06 AM  Contact: [phone] after 3 PM  "Be curious, not judgmental..." -Debbora Dus

## 2022-05-08 NOTE — Progress Notes (Signed)
PMR Admission Coordinator Pre-Admission Assessment   Patient: John Ware is an 56 y.o., male MRN: [id number] DOB: 02-28-1966 Height: _0  (177.8 cm) Weight: 70.5 kg   Insurance Information HMO:     PPO: yes     PCP:      IPA:      80/20:      OTHER:  PRIMARY: [employer]      Policy#: [id number]      Subscriber: patient CM Name: Lattie Haw       Phone#: [phone]     Fax#: [phone]  Lattie Haw at [facility] called with approval 9/12 for admit 9/13-9/20 with updates due 0/03 Pre-Cert#: [id number]     Employer:  Benefits:  Phone #: [email or web address]     Name:  Eff. Date: 05/26/18-still active     Deduct: $1,500 ($1,500 met)      Out of Pocket Max: $4,000 ($2,029.71 met)      Life Max: NA CIR: 80% coverage, 20% co-insurance      SNF: 80% coverage, 20% co-insurance, limited to 10 days/cal yr Outpatient: 80% coverage     Co-Pay: 20% co-insurance Home Health: 80% coverage      Co-Pay: 20% co-insurance DME: 80% coverage     Co-Pay: 20% co-insurance Providers: in-network SECONDARY:       Policy#:      Phone#:    Development worker, community:       Phone#:    The Engineer, petroleum" for patients in Inpatient Rehabilitation Facilities with attached "Privacy Act [place] Records" was provided and verbally reviewed with: N/A   Emergency Contact Information Contact Information       Name Relation Home Work Mobile    [place] Spouse [phone]   [phone]    Skyles,Francis Mother [phone]        [place] Lloyd Niece     [phone]           Current Medical History  Patient Admitting Diagnosis: Osteomyelitis/discitis at T11-12 interspace; s/p decompression and debridement of abscess via T10-11 laminectomy History of Present Illness: Pt is a 56 year old male with medical hx significant for: DM II, chronic diastolic heart failure, HTN, spinal stenosis, depression, osteomyelitis, epidural abscess, infective myositis, discitis of thoracolumbar region,  cervical spondylosis with myelopathy, MRSA infection, MSSA infection, psoas abscess, chest wall abscess, endocarditis, ascending aorta dilatation, lumbar stenosis with neurogenic claudication, transient weakness of RLE, total shoulder arthroplasty, lumbar laminectomy/decompression microdiscectomy, I&D of shoulder, I&D of multiple abscesses. Pt [facility] on [facility]22[facility]22 d/t inability walk, bilateral leg numbness, urinary incontinence/urgency x3 weeks, difficulty completing ADLs.  MRI lumbar spine on 8/28 showed 1) acute osteomyelitis discitis at T11-12 interspace. Associtaed epidural phelgmon/abscess extending from lower thoracic spine to ~level of T12-L1 resulting in mild spinal stenosis at level of T11-12 without frank cord compression 2) Soft tissue abscess within subcutaneous fat of left back at level of T12 3) subacute to chronic changes of osteomyelitis discitis at L1-2 and L2-3. Neurosurgery consulted. IV antibiotics started. MRI thoracic spine on 8/29 showed osteomyelitis is confined to levels of T10-T11 and T12. Moderate central canal stenosis secondary to phlegmon in this region. Pt underwent T10-11 disc aspiration by Dr. Karenann Cai on 04/24/22. Aspirated culture yielded Pseudomonas aeruginosa. Cefepime and vancomycin initiated. Pt's vancomycin changed to doxycyline on 9/6 to reduce amount of IV antibiotics. Repeat MRI thoracic spine on 9/6 showed persistent osteomyelitis with abnormal signal to T9-T12. Neurosurgery recommended decompression of lower thoracic spine and evacuation of pus; pt underwent  procedure by Dr. Ellene Route on 05/02/22. Pt to continue cefepime IV and oral doxycycline x 6 weeks and then switch to ciprofloxacin and doxycycline chronically. Therapy evaluations completed and CIR recommended d/t pt's deficits in functional mobility and inability to complete ADLs independently.    Patient's medical record from [facility] has been reviewed by the rehabilitation  admission coordinator and physician.   Past Medical History      Past Medical History:  Diagnosis Date   Depression     Diabetes mellitus without complication ([place])     Hypertension     Spinal stenosis      With Neurogenic Claudication      Has the patient had major surgery during 100 days prior to admission? Yes   Family History   family history includes Cancer in his mother.   Current Medications   Current Facility-Administered Medications:    0.9 %  sodium chloride infusion, , Intravenous, PRN, Kristeen Miss, MD, Stopped at 04/23/22 1631   acetaminophen (TYLENOL) tablet 650 mg, 650 mg, Oral, Q6H PRN, 650 mg at 05/02/22 2204 **OR** acetaminophen (TYLENOL) suppository 650 mg, 650 mg, Rectal, Q6H PRN, Kristeen Miss, MD   alum & mag hydroxide-simeth (MAALOX/MYLANTA) 200-200-20 MG/5ML suspension 30 mL, 30 mL, Oral, Q6H PRN, Kristeen Miss, MD   baclofen (LIORESAL) tablet 5 mg, 5 mg, Oral, TID, Kristeen Miss, MD, 5 mg at 05/06/22 4401   bisacodyl (DULCOLAX) suppository 10 mg, 10 mg, Rectal, Daily PRN, Kristeen Miss, MD   ceFEPIme (MAXIPIME) 2 g in sodium chloride 0.9 % 100 mL IVPB, 2 g, Intravenous, Q8H, Elsner, Mallie Mussel, MD, Last Rate: 200 mL/hr at 05/06/22 0536, 2 g at 05/06/22 0536   Chlorhexidine Gluconate Cloth 2 % PADS 6 each, 6 each, Topical, Daily, Charlynne Cousins, MD, 6 each at 05/06/22 0939   doxycycline (VIBRA-TABS) tablet 100 mg, 100 mg, Oral, Q12H, Kristeen Miss, MD, 100 mg at 05/06/22 0272   gabapentin (NEURONTIN) capsule 400 mg, 400 mg, Oral, BID, Charlynne Cousins, MD, 400 mg at 05/06/22 5366   hydrOXYzine (ATARAX) tablet 10 mg, 10 mg, Oral, TID PRN, Kristeen Miss, MD, 10 mg at 05/06/22 0937   insulin aspart (novoLOG) injection 0-15 Units, 0-15 Units, Subcutaneous, TID WC, Charlynne Cousins, MD   insulin aspart (novoLOG) injection 0-5 Units, 0-5 Units, Subcutaneous, QHS, Charlynne Cousins, MD   insulin detemir (LEVEMIR) injection 5 Units, 5 Units, Subcutaneous,  BID, Charlynne Cousins, MD   menthol-cetylpyridinium (CEPACOL) lozenge 3 mg, 1 lozenge, Oral, PRN **OR** phenol (CHLORASEPTIC) mouth spray 1 spray, 1 spray, Mouth/Throat, PRN, Kristeen Miss, MD   morphine (MS CONTIN) 12 hr tablet 45 mg, 45 mg, Oral, Q12H, Kristeen Miss, MD, 45 mg at 05/06/22 0936   morphine (PF) 2 MG/ML injection 2-4 mg, 2-4 mg, Intravenous, Q2H PRN, Kristeen Miss, MD, 4 mg at 05/06/22 0538   naloxone (NARCAN) injection 0.4 mg, 0.4 mg, Intravenous, PRN, Kristeen Miss, MD   ondansetron (ZOFRAN) tablet 4 mg, 4 mg, Oral, Q6H PRN **OR** ondansetron (ZOFRAN) injection 4 mg, 4 mg, Intravenous, Q6H PRN, Kristeen Miss, MD   oxyCODONE-acetaminophen (PERCOCET/ROXICET) 5-325 MG per tablet 2 tablet, 2 tablet, Oral, Q6H PRN, Kristeen Miss, MD, 2 tablet at 05/06/22 0949   sodium chloride flush (NS) 0.9 % injection 10-40 mL, 10-40 mL, Intracatheter, Q12H, Charlynne Cousins, MD, 10 mL at 05/06/22 4403   Patients Current Diet:  Diet Order  Diet - low sodium heart healthy             Diet regular Room service appropriate? Yes; Fluid consistency: Thin  Diet effective now                         Precautions / Restrictions Precautions Precautions: Back, Fall Precaution Booklet Issued: No Precaution Comments: suggested log rolling for pain management Restrictions Weight Bearing Restrictions: No    Has the patient had 2 or more falls or a fall with injury in the past year? Yes   Prior Activity Level Community (5-7x/wk): everyday, driving   Prior Functional Level Self Care: Did the patient need help bathing, dressing, using the toilet or eating? Independent   Indoor Mobility: Did the patient need assistance with walking from room to room (with or without device)? Independent   Stairs: Did the patient need assistance with internal or external stairs (with or without device)? Independent   Functional Cognition: Did the patient need help planning regular tasks  such as shopping or remembering to take medications? Independent   Patient Information Are you of Hispanic, Latino/a,or Spanish origin?: A. No, not of Hispanic, Latino/a, or Spanish origin What is your race?: A. White Do you need or want an interpreter to communicate with a doctor or health care staff?: 0. No   Patient's Response To:  Health Literacy and Transportation Is the patient able to respond to health literacy and transportation needs?: Yes Health Literacy - How often do you need to have someone help you when you read instructions, pamphlets, or other written material from your doctor or pharmacy?: Never In the past 12 months, has lack of transportation kept you from medical appointments or from getting medications?: No In the past 12 months, has lack of transportation kept you from meetings, work, or from getting things needed for daily living?: No   Home Assistive Devices / [place] Devices/Equipment: Environmental consultant (specify type), Shower chair with back, Wheelchair Home Equipment: Shower seat, Conservation officer, nature (2 wheels), [employer] - single point, Wheelchair - manual, Other (comment)   Prior Device Use: Indicate devices/aids used by the patient prior to current illness, exacerbation or injury? None of the above. Pt used wheelchair during 3 week decline prior to hospital admission.   Current Functional Level Cognition   Overall Cognitive Status: Within Functional Limits for tasks assessed Orientation Level: Oriented X4 General Comments: pt aware of deficits, didn't appear to be as fearful with falling, expressed gratitude for "being pushed today"    Extremity Assessment (includes Sensation/Coordination)   Upper Extremity Assessment: Defer to OT evaluation LUE Deficits / Details: limited shoulder flexion to approx 90*, distally WFL LUE Sensation: history of peripheral neuropathy LUE Coordination: decreased gross motor  Lower Extremity Assessment: Defer to PT evaluation RLE  Deficits / Details: AROM WFL, strength hip flexion 3-/5, knee extension 3+/5, ankle DF at least 3/5; numbness throughout LE, but hypersensitivity around trunk since surgery RLE Sensation: decreased light touch LLE Deficits / Details: AROM WFL, strength hip flexion 3/5, knee extension 4-/5, ankle DF at least 3/5; numbness throughout LE worse than R, but hypersensitivity around trunk since surgery LLE Sensation: decreased light touch     ADLs   Overall ADL's : Needs assistance/impaired Grooming: Set up, Sitting Grooming Details (indicate cue type and reason): seated at sink Upper Body Bathing: Set up Upper Body Bathing Details (indicate cue type and reason): seated at sink Upper Body Dressing : Set up, Sitting  Lower Body Dressing: Maximal assistance, Sitting/lateral leans Lower Body Dressing Details (indicate cue type and reason): simulated EOB Toilet Transfer: Minimal assistance, Ambulation, BSC/3in1 Toilet Transfer Details (indicate cue type and reason): simulated via functional mobility in room Functional mobility during ADLs: Min guard General ADL Comments: limited to EOB, pt declines standing or transferring today     Mobility   Overal bed mobility: Needs Assistance Bed Mobility: Sidelying to Sit, Rolling Rolling: Modified independent (Device/Increase time) Sidelying to sit: HOB elevated, Modified independent (Device/Increase time) Sit to sidelying: Supervision General bed mobility comments: encouraged log roll however pt brough self up into long sit without use of bed rail, HOB elevated     Transfers   Overall transfer level: Needs assistance Equipment used: Rolling walker (2 wheels) Transfers: Sit to/from Stand Sit to Stand: Min assist Bed to/from chair/wheelchair/BSC transfer type:: Step pivot Step pivot transfers: Min guard  Lateral/Scoot Transfers: Min guard General transfer comment: bed elevated, minA for safety and stability during transition of hands from bed to RW      Ambulation / Gait / Stairs / Wheelchair Mobility   Ambulation/Gait Ambulation/Gait assistance: Mod assist, +2 safety/equipment Gait Distance (Feet): 18 Feet (x1, 26'x1) Assistive device: Rolling walker (2 wheels) Gait Pattern/deviations: Step-to pattern, Ataxic, Narrow base of support General Gait Details: pt with noted L LE ataxia, modA to prevent L LE from adducting to the R, pt with supinated L foot, pt with increased depence on bilat UEs due to feeling of bilat LEs from buckling Gait velocity: reduced Gait velocity interpretation: <1.31 ft/sec, indicative of household ambulator     Posture / Balance Dynamic Sitting Balance Sitting balance - Comments: preference to 1 UE support dynamically, but weak postural muscles noted with posterior pelvic tilt, rounded spine and forward head and shoulders and preferencing UE support Balance Overall balance assessment: Needs assistance Sitting-balance support: No upper extremity supported, Feet supported Sitting balance-[name]: Good Sitting balance - Comments: preference to 1 UE support dynamically, but weak postural muscles noted with posterior pelvic tilt, rounded spine and forward head and shoulders and preferencing UE support Standing balance support: Bilateral upper extremity supported, Reliant on assistive device for balance Standing balance-[name]: Poor Standing balance comment: reliant on external support     Special needs/care consideration Continuous Drip IV  0.9% sodium chloride infusion, Skin Surgical incision: back; Rash: buttocks, leg/right, left, bilateral, and Diabetic management Levemir: 5 units 2x/day; novoLOG 0-5 units daily at bedtime; novoLOG 0-15 units 3x daily at meals    Previous Home Environment (from acute therapy documentation) Living Arrangements: Spouse/significant other  Lives With: Spouse Available Help at Discharge: Family, Available 24 hours/day Type of Home: House Home Layout: One level Home Access:  Ramped entrance Bathroom Shower/Tub: Multimedia programmer: Handicapped height Bathroom Accessibility: Yes How Accessible: Accessible via walker Home Care Services: No   Discharge Living Setting Plans for Discharge Living Setting: Patient's home Type of Home at Discharge: House Discharge Home Layout: One level Discharge Home Access: [place] entrance Discharge Bathroom Shower/Tub: Walk-in shower Discharge Bathroom Toilet: Handicapped height Discharge Bathroom Accessibility: Yes How Accessible: Accessible via walker Does the patient have any problems obtaining your medications?: No   Social/Family/Support Systems Anticipated Caregiver: Guenther [place], wife Anticipated Caregiver's Contact Information: [phone] Caregiver Availability: 24/7 Discharge Plan Discussed with Primary Caregiver: Yes Is Caregiver In Agreement with Plan?: Yes Does Caregiver/Family have Issues with Lodging/Transportation while Pt is in Rehab?: No   Goals Patient/Family Goal for Rehab: Supervision: PT/OT Expected length of stay: 10-12  days Pt/Family Agrees to Admission and willing to participate: Yes Program Orientation Provided & Reviewed with Pt/Caregiver Including Roles  & Responsibilities: Yes   Decrease burden of Care through IP rehab admission: NA   Possible need for SNF placement upon discharge: Not anticipated   Patient Condition: I have reviewed medical records from [facility], spoken with CM, and patient and spouse. I met with patient at the bedside and discussed via phone for inpatient rehabilitation assessment.  Patient will benefit from ongoing PT and OT, can actively participate in 3 hours of therapy a day 5 days of the week, and can make measurable gains during the admission.  Patient will also benefit from the coordinated team approach during an Inpatient Acute Rehabilitation admission.  The patient will receive intensive therapy as well as Rehabilitation physician, nursing,  social worker, and care management interventions.  Due to safety, skin/wound care, disease management, medication administration, pain management, and patient education the patient requires 24 hour a day rehabilitation nursing.  The patient is currently min-Mod A with mobility and basic ADLs.  Discharge setting and therapy post discharge at home with home health is anticipated.  Patient has agreed to participate in the Acute Inpatient Rehabilitation Program and will admit today.   Preadmission Screen Completed By:  Bethel Born, 05/06/2022 11:49 AM ______________________________________________________________________   Discussed status with Dr. Ranell [name]  on 05/08/22 at 68 and received approval for admission today.   Admission Coordinator:  Bethel Born, CCC-SLP, time 1030/Date 05/08/22    Assessment/Plan: Diagnosis: s/p T10-t11 disc aspiration via laminectomy for osteomyelitis Does the need for close, 24 hr/day Medical supervision in concert with the patient's rehab needs make it unreasonable for this patient to be served in a less intensive setting? Yes Co-Morbidities requiring supervision/potential complications: progressive back pain, MSSA, type 2 diabetes, chronic diastolic heart failure, bacteremia, tricuspid endocarditis,  Due to bladder management, bowel management, safety, skin/wound care, disease management, medication administration, pain management, and patient education, does the patient require 24 hr/day rehab nursing? Yes Does the patient require coordinated care of a physician, rehab nurse, PT, OT to address physical and functional deficits in the context of the above medical diagnosis(es)? Yes Addressing deficits in the following areas: balance, endurance, locomotion, strength, transferring, bowel/bladder control, bathing, dressing, feeding, grooming, toileting, and psychosocial support Can the patient actively participate in an intensive therapy program of at least 3  hrs of therapy 5 days a week? Yes The potential for patient to make measurable gains while on inpatient rehab is excellent Anticipated functional outcomes upon discharge from inpatient rehab: modified independent PT, modified independent OT, modified independent SLP Estimated rehab length of stay to reach the above functional goals is: 7-9 days Anticipated discharge destination: Home 10. Overall Rehab/Functional Prognosis: excellent     MD Signature: Leeroy Cha, MD

## 2022-05-09 LAB — CBC WITH DIFFERENTIAL/PLATELET
Abs Immature Granulocytes: 0.07 10*3/uL (ref 0.00–0.07)
Basophils Absolute: 0.1 10*3/uL (ref 0.0–0.1)
Basophils Relative: 1 %
Eosinophils Absolute: 1.3 10*3/uL — ABNORMAL HIGH (ref 0.0–0.5)
Eosinophils Relative: 10 %
HCT: 28 % — ABNORMAL LOW (ref 39.0–52.0)
Hemoglobin: 9.1 g/dL — ABNORMAL LOW (ref 13.0–17.0)
Immature Granulocytes: 1 %
Lymphocytes Relative: 15 %
Lymphs Abs: 1.9 10*3/uL (ref 0.7–4.0)
MCH: 25.7 pg — ABNORMAL LOW (ref 26.0–34.0)
MCHC: 32.5 g/dL (ref 30.0–36.0)
MCV: 79.1 fL — ABNORMAL LOW (ref 80.0–100.0)
Monocytes Absolute: 1 10*3/uL (ref 0.1–1.0)
Monocytes Relative: 8 %
Neutro Abs: 8.6 10*3/uL — ABNORMAL HIGH (ref 1.7–7.7)
Neutrophils Relative %: 65 %
Platelets: 492 10*3/uL — ABNORMAL HIGH (ref 150–400)
RBC: 3.54 MIL/uL — ABNORMAL LOW (ref 4.22–5.81)
RDW: 15.4 % (ref 11.5–15.5)
WBC: 13 10*3/uL — ABNORMAL HIGH (ref 4.0–10.5)
nRBC: 0 % (ref 0.0–0.2)

## 2022-05-09 LAB — COMPREHENSIVE METABOLIC PANEL
ALT: 18 U/L (ref 0–44)
AST: 14 U/L — ABNORMAL LOW (ref 15–41)
Albumin: 2.8 g/dL — ABNORMAL LOW (ref 3.5–5.0)
Alkaline Phosphatase: 92 U/L (ref 38–126)
Anion gap: 10 (ref 5–15)
BUN: 7 mg/dL (ref 6–20)
CO2: 28 mmol/L (ref 22–32)
Calcium: 9.2 mg/dL (ref 8.9–10.3)
Chloride: 99 mmol/L (ref 98–111)
Creatinine, Ser: 0.53 mg/dL — ABNORMAL LOW (ref 0.61–1.24)
GFR, Estimated: >60 mL/min (ref >60)
Glucose, Bld: 153 mg/dL — ABNORMAL HIGH (ref 70–99)
Potassium: 3.9 mmol/L (ref 3.5–5.1)
Sodium: 137 mmol/L (ref 135–145)
Total Bilirubin: 0.5 mg/dL (ref 0.3–1.2)
Total Protein: 6.7 g/dL (ref 6.5–8.1)

## 2022-05-09 MED ORDER — MORPHINE SULFATE ER 15 MG PO TBCR: 30.0000 mg | EXTENDED_RELEASE_TABLET | Freq: Three times a day (TID) | ORAL | Status: DC

## 2022-05-09 MED ORDER — METHADONE HCL 10 MG PO TABS
10.0000 mg | ORAL_TABLET | Freq: Three times a day (TID) | ORAL | Status: AC
  Administered 2022-05-09 – 2022-05-13 (×12): 10 mg via ORAL
  Filled 2022-05-09 (×12): qty 1

## 2022-05-09 MED ORDER — METHADONE HCL 10 MG PO TABS
15.0000 mg | ORAL_TABLET | Freq: Two times a day (BID) | ORAL | Status: DC
  Administered 2022-05-13 – 2022-05-17 (×8): 15 mg via ORAL
  Filled 2022-05-09 (×10): qty 2

## 2022-05-09 MED ORDER — HYDROXYZINE HCL 10 MG PO TABS
10.0000 mg | ORAL_TABLET | Freq: Three times a day (TID) | ORAL | Status: DC
  Administered 2022-05-09 – 2022-05-17 (×24): 10 mg via ORAL
  Filled 2022-05-09 (×26): qty 1

## 2022-05-09 MED ORDER — CHLORHEXIDINE GLUCONATE CLOTH 2 % EX PADS
6.0000 | MEDICATED_PAD | Freq: Two times a day (BID) | CUTANEOUS | Status: DC
  Administered 2022-05-10 – 2022-05-17 (×8): 6 via TOPICAL

## 2022-05-09 NOTE — Progress Notes (Signed)
   05/09/22 1510  Clinical Encounter Type  Visited With Patient  Visit Type Initial  Referral From Nurse  Consult/Referral To Chaplain   Chaplain followed up with patient and provided education on the HCPOA. Patient will have nurse to follow-up with Spiritual Care for execution.   Jon Gills, Resident Chaplain [phone]

## 2022-05-09 NOTE — Progress Notes (Signed)
Met with patient who was with therapy at the sink.  Verified that the patient aware of IV ABT x 6 weeks and then will start Doxy and Cipro (chronic meds) Informed that have provided handouts on both medications to review at his leisure. Went over CHG baths twice a day. Patient unhappy about the times of care. Explained that everything has a schedule because therapies are on schedule. Therapy also informed patient that the times he was asking for will most likely be in therapy.  Patient asked if we had to have another department come and unhook his IV. I said yes that IV team must come in and disconnect. Our nurses can start medications. I added that due to the amount of IV medications that most likely his IV will be continuous to reduce the risk of infection. He also added that needs the NS to run to maintain line. Started to discuss HTN and diet.  Patient immediately went into that can't eat the ordered diet here that has no salt and it's important that he eat.  Patient's alarm also going off that his blood sugar is high and that he knows what he can eat and not eat. He has been eating correctly for over 20 years. Patient reports that's has already addressed with MD. Let patient with therapy up in chair to finish the rest of therapy.

## 2022-05-09 NOTE — Progress Notes (Addendum)
Attempted to educated patient on pain regimen  Patient became irate and non-receptive to education. Tech also reports patient was irate when she was assisting him with looking for glasses.     Tilden Dome, LPN

## 2022-05-09 NOTE — Evaluation (Signed)
Occupational Therapy Assessment and Plan  Patient Details  Name: John Ware MRN: [id number] Date of Birth: [birth year]  OT Diagnosis: acute pain and ataxia Rehab Potential: Rehab Potential (ACUTE ONLY): Good ELOS: 10 - 12 days   Today's Date: 05/09/2022 OT Individual Time: 0903-1005 OT Individual Time Calculation (min): 62 min     Hospital Problem: Principal Problem:   Osteomyelitis of lumbar spine ([place])   Past Medical History:  Past Medical History:  Diagnosis Date   Depression    Diabetes mellitus without complication ([place])    Hypertension    Spinal stenosis    With Neurogenic Claudication   Past Surgical History:  Past Surgical History:  Procedure Laterality Date   CARPAL TUNNEL RELEASE Bilateral    INCISION AND DRAINAGE ABSCESS Left 03/12/2021   Procedure: INCISION AND DRAINAGE ABSCESS;  Surgeon: Marchia Bond, MD;  Location: [facility];  Service: Orthopedics;  Laterality: Left;   IR FLUORO GUIDED NEEDLE PLC ASPIRATION/INJECTION LOC  04/24/2022   IR US GUIDE BX ASP/DRAIN  02/02/2021   IRRIGATION AND DEBRIDEMENT ABSCESS Left 02/08/2021   Procedure: MINOR INCISION AND DRAINAGE OF ABSCESS;  Surgeon: Marchia Bond, MD;  Location: [facility];  Service: Orthopedics;  Laterality: Left;   IRRIGATION AND DEBRIDEMENT ABSCESS Left 02/10/2021   Procedure: IRRIGATION AND DEBRIDEMENT ABSCESS LEFT LATERAL CHEST WALL;  Surgeon: Greer Pickerel, MD;  Location: [facility];  Service: General;  Laterality: Left;   IRRIGATION AND DEBRIDEMENT ABSCESS Bilateral 02/20/2021   Procedure: MINOR INCISION AND DRAINAGE OF ABSCESS/WITH BILATERAL WOUND VAC PLACEMENT;  Surgeon: Marchia Bond, MD;  Location: [facility];  Service: Orthopedics;  Laterality: Bilateral;   IRRIGATION AND DEBRIDEMENT SHOULDER Bilateral 02/28/2021   Procedure: IRRIGATION AND DEBRIDEMENT SHOULDER;  Surgeon: Hiram Gash, MD;  Location: [facility];  Service: Orthopedics;  Laterality: Bilateral;   IRRIGATION AND DEBRIDEMENT SHOULDER Left 03/06/2021    Procedure: IRRIGATION AND DEBRIDEMENT SHOULDER;  Surgeon: Renette Butters, MD;  Location: [facility];  Service: Orthopedics;  Laterality: Left;   IRRIGATION AND DEBRIDEMENT SHOULDER Left 03/08/2021   Procedure: IRRIGATION AND DEBRIDEMENT SHOULDER;  Surgeon: Marchia Bond, MD;  Location: [facility];  Service: Orthopedics;  Laterality: Left;   LUMBAR LAMINECTOMY FOR EPIDURAL ABSCESS N/A 05/02/2022   Procedure: THORACIC  LAMINECTOMY AND DEBRIDEMENT  FOR EPIDURAL ABSCESS;  Surgeon: Kristeen Miss, MD;  Location: [place];  Service: Neurosurgery;  Laterality: N/A;   LUMBAR LAMINECTOMY/DECOMPRESSION MICRODISCECTOMY N/A 12/13/2014   Procedure: LUMBAR THREE-FOUR, LUMBAR FOUR-FIVE, LUMBAR FIVE-SACRAL ONE LAMINECTOMY;  Surgeon: Kristeen Miss, MD;  Location: [facility];  Service: Neurosurgery;  Laterality: N/A;  L3-4 L4-5 L5-S1 Laminectomy   LUMBAR LAMINECTOMY/DECOMPRESSION MICRODISCECTOMY Left 06/05/2021   Procedure: Left lumbar one-two, Lumbar two-three Laminectomy;  Surgeon: Kristeen Miss, MD;  Location: [place];  Service: Neurosurgery;  Laterality: Left;   MENISCUS REPAIR     Right knee   MINOR APPLICATION OF WOUND VAC Left 02/20/2021   Procedure: MINOR APPLICATION OF KERLIX PACKING/REMOVAL OF WOUND VAC;  Surgeon: Johnathan Hausen, MD;  Location: [facility];  Service: General;  Laterality: Left;   TEE WITHOUT CARDIOVERSION N/A 02/05/2021   Procedure: TRANSESOPHAGEAL ECHOCARDIOGRAM (TEE);  Surgeon: Sueanne Margarita, MD;  Location: [facility];  Service: Cardiovascular;  Laterality: N/A;   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  He was admitted on 04/22/22 with inability to walk, BLE numbness, intermittent fevers  and urinary incontinence X 3 weeks. He was found to have  bacteremia with acute discitis T9/10- T11/T12 with epidural phlegmon/abscess, septic arthritis T11-T12, extensive enhancement paraspinal soft tissue, extensive enhancement soft tissue T2-T7, obliteration of disc space w/retrolisthesis-T4,  with probable soft tissue abscess 2.4 X 1.1 X 2.9 cm soft tissue abscess. He was started on IV antibiotics by Dr. Tommy Medal and IR consulted for aspiration per Dr. Clarice Pole input. He was taken to OR on 09/07 for I and D of SQ abscess decompression and debridement of osteomyelitis via T10-T11 laminectomy.    Wound culture positive pseudomonas and blood cultures positive for MRSA/staph epidermidis. ID recommends 6 weeks IV cefepime w/end date 10/19 and oral doxycycline indefinitely for hardware associated staph infection. He continues to be limited by pain as well as weakness Left > Right with ataxia as well sensory deficits affecting mobility and ADLs. Patient transferred to CIR on 05/08/2022 .    Patient currently requires min with basic self-care skills secondary to ataxia and pain .  Prior to hospitalization, patient could complete ADLs with modified independence.   Patient will benefit from skilled intervention to decrease level of assist with basic self-care skills and increase independence with basic self-care skills prior to discharge home with care partner.  Anticipate patient will require 24 hour supervision and follow up home health.  OT - End of Session Activity Tolerance: Tolerates 30+ min activity with multiple rests Endurance Deficit: Yes Endurance Deficit Description: limited by pain requiring rest breaks OT Assessment Rehab Potential (ACUTE ONLY): Good OT Patient demonstrates  impairments in the following area(s): Balance;Pain;Safety;Endurance;Motor OT Basic ADL's Functional Problem(s): Bathing;Dressing;Toileting OT Transfers Functional Problem(s): Tub/Shower;Toilet OT Additional Impairment(s):  (weakness in LUE) OT Plan OT Intensity: Minimum of 1-2 x/day, 45 to 90 minutes OT Frequency: 5 out of 7 days OT Duration/Estimated Length of Stay: 10 - 12 days OT Treatment/Interventions: Balance/vestibular training;Patient/family education;Self Care/advanced ADL retraining;Therapeutic Exercise;UE/LE Coordination activities;Wheelchair propulsion/positioning;Discharge planning;DME/adaptive equipment instruction;Functional mobility training;Psychosocial support;Pain management;Therapeutic Activities;UE/LE Strength taining/ROM OT Self Feeding Anticipated Outcome(s): set-up assistance OT Basic Self-Care Anticipated Outcome(s): modified independence for dressing, supervision for bathing OT Toileting Anticipated Outcome(s): supervision OT Bathroom Transfers Anticipated Outcome(s): supervision OT Recommendation Recommendations for Other Services:  (None at this time) Patient destination: Home Follow Up Recommendations: Home health OT;24 hour supervision/assistance Equipment Details: TBD - pt reports having manual w/c,FWW, rollator, shower chair, and BSC   OT Evaluation Precautions/Restrictions  Precautions Precautions: Back;Fall Precaution Booklet Issued: No Precaution Comments: impulsivity, motor control deficits for LLE Required Braces or Orthoses:  (per chart review - no brace needed per MD) Restrictions Weight Bearing Restrictions: No General Chart Reviewed: Yes Family/Caregiver Present: No Vital Signs Please see "Flowsheet" for most recent vitals charted by nursing staff.  Pain Pain Assessment Pain Scale: 0-10 Pain Score: 6  Pain Type: Acute pain Pain Orientation: Mid Pain Descriptors / Indicators: Stabbing Pain Onset: On-going Pain Intervention(s):  Distraction;Emotional support Multiple Pain Sites: No Home Living/Prior [place] expects to be discharged to:: Private residence Living Arrangements: Spouse/significant other Available Help at Discharge: Family, Available 24 hours/day (spouse works from home) Type  of Home: House Home Access: Ramped entrance [place]: One level Bathroom Shower/Tub: Walk-in shower (open entry & various shwoer heads) Bathroom Toilet: Handicapped height Bathroom Accessibility: Yes Additional Comments: Pt reports being admitted ~last November and [place] at w/c level & performing SB transfers. However, pt reports being able to walk starting ~February.  Lives With: Spouse IADL History Homemaking Responsibilities: Yes (Pt's spouse has been providing assist with IADLs since initial infection. However, pt reports when pt able toperform ambulation pt would perform meal prep & medication management with spouse performing all other IADLs.) Current License: Yes Mode of Transportation: Car Occupation: Retired Type of Occupation: Development worker, community prior to retirement Leisure and Hobbies: hunting, fishing, walking, riding Games developer, gardening, yardwork Prior Function Level of Independence: Independent with basic ADLs, Independent with gait, Independent with transfers (Pt reports being able to perform ADLs with modified independence using [place] for household tasks.)  Able to Take Stairs?: Yes (able to take stairs prior to Spring of 2022 and intermittently after initial infection) Driving: Yes (Pt reports first infection started in Spring 2022 and pt has drove since being able to start ambulating.) Vocation: Retired Surveyor, mining Baseline Vision/History: 1 Wears glasses (bifocals - pt report wearing primarily for reading) Ability to See in Adequate Light: 0 Adequate Patient Visual Report: No change from baseline Vision Assessment?: No apparent visual deficits Perception  Perception:  Not tested Praxis Praxis: Not tested Cognition Cognition Overall Cognitive Status: No family/caregiver present to determine baseline cognitive functioning Arousal/Alertness: Awake/alert Orientation Level: Person;Place;Situation Person: Oriented Place: Oriented Situation: Oriented Memory: Appears intact (However, inconsistent reports with PMH and course of hospitalizations. Will need further testing.) Attention: Focused;Sustained;Selective Focused Attention: Appears intact Sustained Attention: Appears intact Selective Attention: Appears intact Awareness: Appears intact Problem Solving: Appears intact Executive Function:  (Needs further assessment.) Behaviors: Impulsive;Poor frustration tolerance (Frustration noted with bed alarm and intermittent impulsivity with mobility) Safety/Judgment: Impaired Brief Interview for Mental Status (BIMS) Repetition of Three Words (First Attempt): 3 Temporal Orientation: Year: Correct Temporal Orientation: Month: Accurate within 5 days Temporal Orientation: Day: Correct Recall: "Sock": Yes, no cue required Recall: "Blue": Yes, no cue required Recall: "Bed": Yes, no cue required BIMS Summary Score: 15 Sensation Sensation Light Touch: Impaired by gross assessment Additional Comments: Pt reports diminished sensation in B feet and minimal sensation deficits in B hands. Sensation not formally tested, however, information gathered from pt report and gross assessment with ADLs Coordination Gross Motor Movements are Fluid and Coordinated: No Fine Motor Movements are Fluid and Coordinated: Yes Coordination and Movement Description: Pt presents with ataxic-like movements in LLE with ambulation. Pt presents with intermittent scissoring gait and weakness in RLE. 9 Hole Peg Test: TBD if warranted Motor  Motor Motor: Ataxia Motor - Skilled Clinical Observations: motor control deficits noted in BLE with LLE > RLE  Trunk/Postural Assessment  Cervical  Assessment Cervical Assessment: Within Functional Limits Thoracic Assessment Thoracic Assessment: Within Functional Limits Lumbar Assessment Lumbar Assessment: Within Functional Limits Postural Control Postural Control: Within Functional Limits  Balance Balance Balance Assessed: Yes Static Sitting Balance Static Sitting - Balance Support: Feet unsupported Static Sitting - Level of Assistance: 5: Stand by assistance Dynamic Sitting Balance Dynamic Sitting - Balance Support: Feet unsupported Dynamic Sitting - Level of Assistance: 5: Stand by assistance Dynamic Sitting - Balance Activities: Reaching for objects Static Standing Balance Static Standing - Balance Support: Bilateral upper extremity supported Static Standing - Level of Assistance: 4: Min assist Dynamic Standing Balance Dynamic Standing - Balance Support: Bilateral upper  extremity supported Dynamic Standing - Level of Assistance: 4: Min assist Extremity/Trunk Assessment RUE Assessment RUE Assessment: Within Functional Limits General Strength Comments: 4/5 in shoulder muscle groups, 4/5 in all other muscle groups. Presents with functional ROM    Care Tool Care Tool Self Care Eating   Eating Assist Level: Set up assist    Oral Care    Oral Care Assist Level: Set up assist    Bathing   Body parts bathed by patient: Right arm;Left arm;Abdomen;Chest;Front perineal area;Left upper leg;Right upper leg;Face;Left lower leg;Right lower leg Body parts bathed by helper: Buttocks   Assist Level: Minimal Assistance - Patient > 75%    Upper Body Dressing(including orthotics)       Assist Level: Supervision/Verbal cueing    Lower Body Dressing (excluding footwear)   What is the patient wearing?: Pants Assist for lower body dressing: Minimal Assistance - Patient > 75%    Putting on/Taking off footwear   What is the patient wearing?: [place] for footwear: Moderate Assistance - Patient 50 - 74%       Care Tool  Toileting Toileting activity   Assist for toileting: Minimal Assistance - Patient > 75%     Care Tool Bed Mobility Roll left and right activity   Roll left and right assist level: Supervision/Verbal cueing    Sit to lying activity   Sit to lying assist level: Supervision/Verbal cueing    Lying to sitting on side of bed activity   Lying to sitting on side of bed assist level: the ability to move from lying on the back to sitting on the side of the bed with no back support.: Supervision/Verbal cueing     Care Tool Transfers Sit to stand transfer   Sit to stand assist level: Independent with assistive device    Chair/bed transfer   Chair/bed transfer assist level: Minimal Assistance - Patient > 75%     Toilet transfer         Care Tool Cognition  Expression of Ideas and Wants Expression of Ideas and Wants: 4. Without difficulty (complex and basic) - expresses complex messages without difficulty and with speech that is clear and easy to understand  Understanding Verbal and Non-Verbal Content Understanding Verbal and Non-Verbal Content: 4. Understands (complex and basic) - clear comprehension without cues or repetitions   Memory/Recall Ability Memory/Recall Ability : That he or she is in a hospital/hospital unit;Staff names and faces;Current season   Refer to Care Plan for Long Term Goals  SHORT TERM GOAL WEEK 1 OT Short Term Goal 1 (Week 1): Pt will perform toileting with CGA with use of LRAD and grab bars as needed. OT Short Term Goal 2 (Week 1): Pt will perform toilet transfer with CGA for safety with LRAD and grab bars as needed. OT Short Term Goal 3 (Week 1): Pt will complete UB/LB dressing with CGA with use of AE as needed while maintaining spinal precautions.  Recommendations for other services: None    Skilled Therapeutic Intervention ADL ADL Grooming: Setup (Pt able to perform oral care and facial hygiene with set-up assist while seated in the w/c.) Where  Assessed-Grooming: Wheelchair;Sitting at sink Upper Body Bathing:  (OT offered a shower and bathing, however, pt declined.) Toileting: Minimal assistance (Pt able to perform clothing management with minimal assist to ensure standing balance with use of FWW and grab bars. Pt voided bladder while sitting on the toilet.) Where Assessed-Toileting: Glass blower/designer: Minimal assistance (Pt able to perform ambulatory transfer  from EOB > toilet with minimal assistance and use of FWW. Pt presents with ataxic movements on LLE and intermittent scissoring gait.) Toilet Transfer Method: Counselling psychologist: Grab bars ADL Comments: Pt able to perform bed mobility with supervision. Pt able to recall 0/3 spinal precautions with OT providing education. Pt able to perform SPT from w/c <> EOB with CGA for safety with FWW. OT offered assistance with a shower or bathing with pt declining at this time. Mobility  Bed Mobility Bed Mobility: Supine to Sit;Sit to Supine Supine to Sit: Supervision/Verbal cueing Sit to Supine: Supervision/Verbal cueing Transfers Sit to Stand: Minimal Assistance - Patient > 75% Stand to Sit: Minimal Assistance - Patient > 75%   Discharge Criteria: Patient will be discharged from OT if patient refuses treatment 3 consecutive times without medical reason, if treatment goals not met, if there is a change in medical status, if patient makes no progress towards goals or if patient is discharged from hospital.  The above assessment, treatment plan, treatment alternatives and goals were discussed and mutually agreed upon: by patient  Barbee Shropshire 05/09/2022, 5:45 PM

## 2022-05-09 NOTE — Progress Notes (Signed)
Inpatient Rehabilitation  Patient information reviewed and entered into eRehab system by Priyana Mccarey M. Proctor Carriker, M.A., CCC/SLP, PPS Coordinator.  Information including medical coding, functional ability and quality indicators will be reviewed and updated through discharge.    

## 2022-05-09 NOTE — Evaluation (Addendum)
Physical Therapy Assessment and Plan  Patient Details  Name: John Ware MRN: [id number] Date of Birth: 1966-01-02  PT Diagnosis: Abnormal posture, Abnormality of gait, Ataxia, Ataxic gait, Difficulty walking, Impaired sensation, Low back pain, and Muscle weakness Rehab Potential: Good ELOS: 10-12 days   Today's Date: 05/09/2022 PT Individual Time: 1330-1430 PT Individual Time Calculation (min): 60 min    Hospital Problem: Principal Problem:   Osteomyelitis of lumbar spine ([place])   Past Medical History:  Past Medical History:  Diagnosis Date   Depression    Diabetes mellitus without complication ([place])    Hypertension    Spinal stenosis    With Neurogenic Claudication   Past Surgical History:  Past Surgical History:  Procedure Laterality Date   CARPAL TUNNEL RELEASE Bilateral    INCISION AND DRAINAGE ABSCESS Left 03/12/2021   Procedure: INCISION AND DRAINAGE ABSCESS;  Surgeon: Marchia Bond, MD;  Location: [facility];  Service: Orthopedics;  Laterality: Left;   IR FLUORO GUIDED NEEDLE PLC ASPIRATION/INJECTION LOC  04/24/2022   IR US GUIDE BX ASP/DRAIN  02/02/2021   IRRIGATION AND DEBRIDEMENT ABSCESS Left 02/08/2021   Procedure: MINOR INCISION AND DRAINAGE OF ABSCESS;  Surgeon: Marchia Bond, MD;  Location: [facility];  Service: Orthopedics;  Laterality: Left;   IRRIGATION AND DEBRIDEMENT ABSCESS Left 02/10/2021   Procedure: IRRIGATION AND DEBRIDEMENT ABSCESS LEFT LATERAL CHEST WALL;  Surgeon: Greer Pickerel, MD;  Location: [facility];  Service: General;  Laterality: Left;   IRRIGATION AND DEBRIDEMENT ABSCESS Bilateral 02/20/2021   Procedure: MINOR INCISION AND DRAINAGE OF ABSCESS/WITH BILATERAL WOUND VAC PLACEMENT;  Surgeon: Marchia Bond, MD;  Location: [facility];  Service: Orthopedics;  Laterality: Bilateral;   IRRIGATION AND DEBRIDEMENT SHOULDER Bilateral 02/28/2021   Procedure: IRRIGATION AND DEBRIDEMENT SHOULDER;  Surgeon: Hiram Gash, MD;  Location: [facility];  Service: Orthopedics;   Laterality: Bilateral;   IRRIGATION AND DEBRIDEMENT SHOULDER Left 03/06/2021   Procedure: IRRIGATION AND DEBRIDEMENT SHOULDER;  Surgeon: Renette Butters, MD;  Location: [facility];  Service: Orthopedics;  Laterality: Left;   IRRIGATION AND DEBRIDEMENT SHOULDER Left 03/08/2021   Procedure: IRRIGATION AND DEBRIDEMENT SHOULDER;  Surgeon: Marchia Bond, MD;  Location: [facility];  Service: Orthopedics;  Laterality: Left;   LUMBAR LAMINECTOMY FOR EPIDURAL ABSCESS N/A 05/02/2022   Procedure: THORACIC  LAMINECTOMY AND DEBRIDEMENT  FOR EPIDURAL ABSCESS;  Surgeon: Kristeen Miss, MD;  Location: [facility];  Service: Neurosurgery;  Laterality: N/A;   LUMBAR LAMINECTOMY/DECOMPRESSION MICRODISCECTOMY N/A 12/13/2014   Procedure: LUMBAR THREE-FOUR, LUMBAR FOUR-FIVE, LUMBAR FIVE-SACRAL ONE LAMINECTOMY;  Surgeon: Kristeen Miss, MD;  Location: [facility];  Service: Neurosurgery;  Laterality: N/A;  L3-4 L4-5 L5-S1 Laminectomy   LUMBAR LAMINECTOMY/DECOMPRESSION MICRODISCECTOMY Left 06/05/2021   Procedure: Left lumbar one-two, Lumbar two-three Laminectomy;  Surgeon: Kristeen Miss, MD;  Location: [facility];  Service: Neurosurgery;  Laterality: Left;   MENISCUS REPAIR     Right knee   MINOR APPLICATION OF WOUND VAC Left 02/20/2021   Procedure: MINOR APPLICATION OF KERLIX PACKING/REMOVAL OF WOUND VAC;  Surgeon: Johnathan Hausen, MD;  Location: [facility];  Service: General;  Laterality: Left;   TEE WITHOUT CARDIOVERSION N/A 02/05/2021   Procedure: TRANSESOPHAGEAL ECHOCARDIOGRAM (TEE);  Surgeon: Sueanne Margarita, MD;  Location: [facility];  Service: Cardiovascular;  Laterality: N/A;   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  He was admitted on 04/22/22 with inability to walk, BLE numbness, intermittent fevers  and urinary incontinence X 3 weeks. He was found to have  bacteremia with acute discitis T9/10- T11/T12 with epidural phlegmon/abscess, septic arthritis T11-T12, extensive enhancement paraspinal soft tissue, extensive enhancement soft tissue T2-T7, obliteration of disc space w/retrolisthesis-T4,  with probable soft tissue abscess 2.4 X 1.1 X 2.9 cm soft tissue abscess. He was started on IV antibiotics by Dr. Tommy Medal and IR consulted for aspiration per Dr. Clarice Pole input. He was taken to OR on 09/07 for I and D of SQ abscess decompression and debridement of osteomyelitis via T10-T11 laminectomy.    Wound culture positive pseudomonas and blood cultures positive for MRSA/staph epidermidis. ID recommends 6 weeks IV cefepime w/end date 10/19 and oral doxycycline indefinitely for hardware associated staph infection. He continues to be limited by pain as well as weakness Left > Right with ataxia as well sensory deficits affecting mobility and ADLs. CIR recommended due to functional decline. Patient transferred to CIR on 05/08/2022 .   Patient currently requires min with mobility secondary to muscle weakness, decreased cardiorespiratoy endurance, ataxia, and decreased standing balance and decreased balance strategies.  Prior to hospitalization, patient was independent  with mobility and lived with Spouse in a House home.  Home access is  Ramped entrance.  Patient will benefit from skilled PT intervention to maximize safe functional mobility, minimize fall risk, and decrease caregiver burden for planned discharge home with 24 hour supervision.  Anticipate patient will benefit from follow up [place] at discharge.  PT - End  of Session Activity Tolerance: Tolerates 30+ min activity with multiple rests Endurance Deficit: Yes Endurance Deficit Description: limited by pain, required multiple rest breaks PT Assessment Rehab Potential (ACUTE/IP ONLY): Good PT Barriers to Discharge: Decreased caregiver support;Wound Care PT Patient demonstrates impairments in the following area(s): Pain;Safety;Endurance;Motor;Sensory PT Transfers Functional Problem(s): Bed Mobility;Bed to Chair;Car PT Locomotion Functional Problem(s): Ambulation;Stairs PT Plan PT Intensity: Minimum of 1-2 x/day ,45 to 90 minutes PT Frequency: 5 out of 7 days PT Duration Estimated Length of Stay: 10-12 days PT Treatment/Interventions: Ambulation/gait training;Discharge planning;DME/adaptive equipment instruction;Functional mobility training;Pain management;Psychosocial support;Therapeutic Activities;UE/LE Strength taining/ROM;UE/LE Coordination activities;Wheelchair propulsion/positioning;Therapeutic Exercise;Stair training;Skin care/wound management;Patient/family education;Neuromuscular re-education;Disease management/prevention;Community reintegration;Balance/vestibular training PT Transfers Anticipated Outcome(s): Supervision with RW PT Locomotion Anticipated Outcome(s): supervision with RW PT Recommendation Follow Up Recommendations: Home health PT Patient destination: Home Equipment Recommended: None recommended by PT Equipment Details: Pt owns required equipment from previous stay   PT Evaluation Precautions/Restrictions Precautions Precautions: Back;Fall Precaution Booklet Issued: No Precaution Comments: impulsivity, motor control deficits for LLE, verbally reviewed precautions, pt recalled 3/3 Restrictions Weight Bearing Restrictions: No General   Vital SignsTherapy Vitals Temp: 98.3 F (36.8 C) Temp Source: Oral Pulse Rate: 83 Resp: 16 BP: (!) 107/91 Patient Position (if appropriate): Lying Pain Pain Assessment Pain Scale:  0-10 Pain Score: 10-Worst pain ever Pain Type: Surgical pain Pain Location: Back Pain Interference Pain Interference Pain Effect on Sleep: 4. Almost constantly Pain Interference with Therapy Activities: 2. Occasionally Pain Interference with Day-to-Day Activities: 1. Rarely or not at all [place]  Help at Discharge: Family;Available 24 hours/day Type of Home: House Home Access: Ramped entrance Home Layout: One level Additional Comments: Pt reports being admitted ~last November and DC'd at w/c level & performing SB transfers. However, pt reports being able to walk starting ~February.  Lives With: Spouse Prior Function Level of Independence: Independent with gait;Independent with transfers  Able to Take Stairs?: Yes Driving: Yes Vocation: Retired Vision/Perception  Vision - History Ability to See in Adequate Light: 0 Adequate Perception Perception: Not tested Praxis Praxis: Not tested  Cognition Overall Cognitive Status: Within Functional Limits for tasks assessed Arousal/Alertness: Awake/alert Orientation Level: Oriented X4 Year: 2023 Month: September Day of Week: Correct Safety/Judgment: Impaired Sensation Sensation Light Touch: Impaired Detail Peripheral sensation comments: Diminished BIL R>L, absent RLE shins and below Light Touch Impaired Details: Impaired RLE;Impaired LLE Hot/Cold: Not tested Proprioception: Impaired by gross assessment Stereognosis: Not tested Coordination Gross Motor Movements are Fluid and Coordinated: No Coordination and Movement Description: Pt presents with ataxic-like movements in LLE with ambulation. Pt presents with intermittent scissoring gait and weakness in RLE. Motor  Motor Motor: Ataxia Motor - Skilled Clinical Observations: motor control deficits noted in BLE with LLE > RLE   Trunk/Postural Assessment  Cervical Assessment Cervical Assessment: Within Functional Limits Thoracic  Assessment Thoracic Assessment: Within Functional Limits Lumbar Assessment Lumbar Assessment: Within Functional Limits Postural Control Postural Control: Within Functional Limits  Balance Balance Balance Assessed: Yes Static Sitting Balance Static Sitting - Balance Support: Feet unsupported Static Sitting - Level of Assistance: 5: Stand by assistance Dynamic Sitting Balance Dynamic Sitting - Balance Support: Feet unsupported Dynamic Sitting - Level of Assistance: 5: Stand by assistance Static Standing Balance Static Standing - Balance Support: Bilateral upper extremity supported Static Standing - Level of Assistance: 4: Min assist Dynamic Standing Balance Dynamic Standing - Balance Support: Bilateral upper extremity supported Dynamic Standing - Level of Assistance: 4: Min assist Extremity Assessment      RLE Assessment RLE Assessment: Exceptions to [facility] RLE Strength Right Hip Flexion: 3+/5 Right Knee Flexion: 3+/5 Right Knee Extension: 3+/5 Right Ankle Dorsiflexion: 4+/5 Right Ankle Plantar Flexion: 3/5 LLE Assessment LLE Assessment: Exceptions to [facility] LLE Strength Left Hip Flexion: 3/5 Left Knee Flexion: 3/5 Left Knee Extension: 3/5 Left Ankle Dorsiflexion: 3+/5 Left Ankle Plantar Flexion: 3+/5  Care Tool Care Tool Bed Mobility Roll left and right activity   Roll left and right assist level: Supervision/Verbal cueing    Sit to lying activity   Sit to lying assist level: Supervision/Verbal cueing    Lying to sitting on side of bed activity   Lying to sitting on side of bed assist level: the ability to move from lying on the back to sitting on the side of the bed with no back support.: Supervision/Verbal cueing     Care Tool Transfers Sit to stand transfer   Sit to stand assist level: Minimal Assistance - Patient > 75%    Chair/bed transfer   Chair/bed transfer assist level: Minimal Assistance - Patient > 75%     Physiological scientist  transfer assist level: Minimal Assistance - Patient > 75%      Care Tool Locomotion Ambulation   Assist level: Minimal Assistance - Patient > 75% Assistive device: Walker-rolling Max distance: 30 ft  Walk 10 feet activity   Assist level: Minimal Assistance - Patient > 75% Assistive device: Walker-rolling   Walk 50 feet with 2 turns activity Walk 50 feet  with 2 turns activity did not occur: Safety/medical concerns      Walk 150 feet activity Walk 150 feet activity did not occur: Safety/medical concerns      Walk 10 feet on uneven surfaces activity Walk 10 feet on uneven surfaces activity did not occur: Safety/medical concerns      Stairs   Assist level: Minimal Assistance - Patient > 75% Stairs assistive device: 2 hand rails Max number of stairs: 1  Walk up/down 1 step activity   Walk up/down 1 step (curb) assist level: Minimal Assistance - Patient > 75% Walk up/down 1 step or curb assistive device: 2 hand rails  Walk up/down 4 steps activity Walk up/down 4 steps activity did not occur: Safety/medical concerns      Walk up/down 12 steps activity Walk up/down 12 steps activity did not occur: Safety/medical concerns      Pick up small objects from floor Pick up small object from the floor (from standing position) activity did not occur: Safety/medical concerns      Wheelchair Is the patient using a wheelchair?: Yes Type of Wheelchair: Manual   Wheelchair assist level: Set up assist Max wheelchair distance: 250 ft  Wheel 50 feet with 2 turns activity   Assist Level: Supervision/Verbal cueing  Wheel 150 feet activity   Assist Level: Supervision/Verbal cueing    Refer to Care Plan for Long Term Goals  SHORT TERM GOAL WEEK 1 PT Short Term Goal 1 (Week 1): pt will ambulate >50 ft with LRAD PT Short Term Goal 2 (Week 1): Pt navigate 6" stair x 2 in preparation for stair navigation PT Short Term Goal 3 (Week 1): Pt will perform >20 minutes of activity before requiring rest  break  Recommendations for other services: None   Skilled Therapeutic Intervention Evaluation completed (see details above) with patient education regarding purpose of PT evaluation, PT POC and goals, therapy schedule, weekly team meetings, and other CIR information including safety plan and fall risk safety. Pt performed the below functional mobility tasks with the specified levels of skilled cuing and assistance. Pt presents with 6/10 pain during session and requested medication at EOS, rest and positioning PRN. Pt presents with high pain levels largely limiting performance. Pt returned to bed at end of session and was left with all needs in reach. Pt vehemently opposed bed alarm. Discussed risks and pt expressed understanding.    Mobility Bed Mobility Bed Mobility: Supine to Sit;Sit to Supine Supine to Sit: Supervision/Verbal cueing Sit to Supine: Supervision/Verbal cueing Transfers Transfers: Sit to Stand;Stand to Sit;Stand Pivot Transfers Sit to Stand: Minimal Assistance - Patient > 75% Stand to Sit: Minimal Assistance - Patient > 75% Stand Pivot Transfers: Minimal Assistance - Patient > 75% Stand Pivot Transfer Details: Verbal cues for technique;Verbal cues for precautions/safety Transfer (Assistive device): Rolling walker Locomotion  Gait Ambulation: Yes Gait Assistance: Minimal Assistance - Patient > 75% Gait Distance (Feet): 30 Feet Assistive device: Rolling walker Gait Assistance Details: Verbal cues for gait pattern Gait Gait: Yes Gait Pattern: Ataxic;Scissoring;Step-through pattern Gait velocity: reduced Stairs / Additional Locomotion Stairs: Yes Stairs Assistance: Minimal Assistance - Patient > 75% Stair Management Technique: Two rails Number of Stairs: 1 Height of Stairs: 6 Ramp: Minimal Assistance - Patient >75% Wheelchair Mobility Wheelchair Mobility: Yes Wheelchair Assistance: Set up Lexicographer: Both upper extremities Wheelchair Parts  Management: Independent Distance: 250 ft   Discharge Criteria: Patient will be discharged from PT if patient refuses treatment 3 consecutive times without medical reason, if  treatment goals not met, if there is a change in medical status, if patient makes no progress towards goals or if patient is discharged from hospital.  The above assessment, treatment plan, treatment alternatives and goals were discussed and mutually agreed upon: by patient  Mickel Fuchs 05/09/2022, 4:13 PM

## 2022-05-09 NOTE — Discharge Instructions (Addendum)
Inpatient Rehab Discharge Instructions  Drevin Ortner Toft Discharge date and time:  05/18/22  Activities/Precautions/ Functional Status: Activity: no lifting, driving, or strenuous exercise till cleared by MD Diet: diabetic diet Wound Care: Wash with soap and water. Keep wound clean and dry. Contact [name] if you develop any problems with your incision/wound--redness, swelling, increase in pain, drainage or if you develop fever or chills.     Functional status:  ___ No restrictions     ___ Walk up steps independently ___ 24/7 supervision/assistance   ___ Walk up steps with assistance _X__ Intermittent supervision/assistance  ___ Bathe/dress independently ___ Walk with walker     _X__ Bathe/dress with assistance ___ Walk Independently    ___ Shower independently ___ Walk with assistance    ___ Shower with assistance _X__ No alcohol     ___ Return to work/school ________   COMMUNITY REFERRALS UPON DISCHARGE:    Home Health:   PT     OT     SN                Agency:[facility]  Phone:[phone] *Please expect follow-up within 2-3 days to schedule your home visit. If you have not received follow-up, be sure to contact the branch directly.*     Medical Equipment/Items Ordered: IV antibiotics (cefepime)                                                 Agency/Supplier: [facility] ([facility]) [phone]  Special Instructions:    My questions have been answered and I understand these instructions. I will adhere to these goals and the provided educational materials after my discharge from the hospital.  Patient/Caregiver Signature _______________________________ Date __________  Clinician Signature _______________________________________ Date __________  Please bring this form and your medication list with you to all your follow-up doctor's appointments.

## 2022-05-09 NOTE — Progress Notes (Signed)
Secure chat with Alcario Drought LPN re disconnecting IVF after 0600 dose of antibiotics on 9/15 for shower with OT 9/15.

## 2022-05-09 NOTE — Progress Notes (Signed)
Occupational Therapy Session Note  Patient Details  Name: John Ware MRN: [id number] Date of Birth: [birth year]  Today's Date: 05/09/2022 OT Individual Time: 0903-1005 OT Individual Time Calculation (min): 62 min    Short Term Goals: Week 1:     Skilled Therapeutic Interventions/Progress Updates:    OT intervention initiated following discussion with evaluating OTR regarding POC. OT intervention included discharge planning, DME requirements, functional amb with RW in room, bed mobility, sit<>stand, and standing balance. Pt has all necessary DME from previous admission. Bed mobility with supervision. Sit<>stand and functional amb in room with min A. Pt reports he feels like his "legs are going to collapse." Amb with heavy reliance on BUE support. Discussed possibility of taking a shower, given that he currently has a PICC line. Discussed with LPN Cicero Duck pt's SBX schedule to enable possibility of taking shower tomorrow. Tentatively scheduled for shower tomorrow. Pt remained in bed with all needs within reach.   Therapy Documentation Precautions:  Precautions Precautions: Back, Fall Precaution Booklet Issued: No Precaution Comments: impulsivity, motor control deficits for LLE, verbally reviewed precautions, pt recalled 3/3 Required Braces or Orthoses:  (per chart review - no brace needed per MD) Restrictions Weight Bearing Restrictions: No   Pain: Pain Assessment Pain Scale: 0-10 Pain Score: 6  Pain Type: Surgical pain Pain Location: Back Repositioned   Therapy/Group: Individual Therapy  Rich Brave 05/09/2022, 2:29 PM

## 2022-05-09 NOTE — Progress Notes (Signed)
PROGRESS NOTE   Subjective/Complaints:  Pt reports bowels working well- LBM yesterday- not constipated.  Pain, nociceptive and nerve pain both really bothersome- asking to switch his MS Contin to 30 mg q8 hours vs 45 mg BID since only lasts 9-10 hours and percocet lasts 4.5 to 5 hours-   Discussed trying a different long acting pain med at length- discussed options- decided to try Methadone 10 mg TID- in 4 days, will change to 15 mg BID since half life increases- also discussed with pharmacy.   Also itching badly- which could be due to ms contin- so another reason to try and change it.    ROS:  Pt denies SOB, abd pain, CP, N/V/C/D, and vision changes   Objective:   No results found. Recent Labs    05/09/22 0358  WBC 13.0*  HGB 9.1*  HCT 28.0*  PLT 492*   Recent Labs    05/09/22 0358  NA 137  K 3.9  CL 99  CO2 28  GLUCOSE 153*  BUN 7  CREATININE 0.53*  CALCIUM 9.2    Intake/Output Summary (Last 24 hours) at 05/09/2022 2014 Last data filed at 05/09/2022 1300 Gross per 24 hour  Intake 340 ml  Output 1500 ml  Net -1160 ml        Physical Exam: Vital Signs Blood pressure 104/72, pulse 81, temperature 99 F (37.2 C), temperature source Oral, resp. rate 17, height 5\' 10"  (1.778 m), weight 71.7 kg, SpO2 98 %.    General: awake, alert, appropriate, laying in supine in bed; irritable with staff; not so much with me; NAD HENT: conjugate gaze; oropharynx moist CV: regular rate; no JVD Pulmonary: CTA B/L; no W/R/R- good air movement GI: soft, NT, ND, (+)BS Psychiatric: appropriate- irritable with staff;  Neurological: Ox3 Musculoskeletal:     Comments: Left shoulder with atrophy and limited ROM. Biceps 3/5. LLE ataxic.  Otherwise with 5/5 strength. Skin:    Comments: Back incision C/D/I with two fluctuant areas (decreased per wife) - very puffy- no drainage- no redness- Buttocks- looks like healing heat  rash Neurological:     Mental Status: He is alert and oriented to person, place, and time. Speech is fluent       Assessment/Plan: 1. Functional deficits which require 3+ hours per day of interdisciplinary therapy in a comprehensive inpatient rehab setting. Physiatrist is providing close team supervision and 24 hour management of active medical problems listed below. Physiatrist and rehab team continue to assess barriers to discharge/monitor patient progress toward functional and medical goals  Care Tool:  Bathing    Body parts bathed by patient: Right arm, Left arm, Abdomen, Chest, Front perineal area, Left upper leg, Right upper leg, Face, Left lower leg, Right lower leg   Body parts bathed by helper: Buttocks     Bathing assist Assist Level: Minimal Assistance - Patient > 75%     Upper Body Dressing/Undressing Upper body dressing        Upper body assist Assist Level: Supervision/Verbal cueing    Lower Body Dressing/Undressing Lower body dressing      What is the patient wearing?: Pants     Lower body assist Assist  for lower body dressing: Minimal Assistance - Patient > 75%     Toileting Toileting    Toileting assist Assist for toileting: Minimal Assistance - Patient > 75%     Transfers Chair/bed transfer  Transfers assist     Chair/bed transfer assist level: Minimal Assistance - Patient > 75%     Locomotion Ambulation   Ambulation assist      Assist level: Minimal Assistance - Patient > 75% Assistive device: Walker-rolling Max distance: 30 ft   Walk 10 feet activity   Assist     Assist level: Minimal Assistance - Patient > 75% Assistive device: Walker-rolling   Walk 50 feet activity   Assist Walk 50 feet with 2 turns activity did not occur: Safety/medical concerns         Walk 150 feet activity   Assist Walk 150 feet activity did not occur: Safety/medical concerns         Walk 10 feet on uneven surface   activity   Assist Walk 10 feet on uneven surfaces activity did not occur: Safety/medical concerns         Wheelchair     Assist Is the patient using a wheelchair?: Yes Type of Wheelchair: Manual    Wheelchair assist level: Set up assist Max wheelchair distance: 250 ft    Wheelchair 50 feet with 2 turns activity    Assist        Assist Level: Supervision/Verbal cueing   Wheelchair 150 feet activity     Assist      Assist Level: Supervision/Verbal cueing   Blood pressure 104/72, pulse 81, temperature 99 F (37.2 C), temperature source Oral, resp. rate 17, height 5\' 10"  (1.778 m), weight 71.7 kg, SpO2 98 %.  Medical Problem List and Plan: 1. 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  Antithrombotics: -DVT/anticoagulation:  Mechanical: Sequential compression devices, below knee Bilateral lower extremities             -antiplatelet therapy: N/A 3. Pain: continue prn oxycodone.   9/14- will change ms contin to methadone 10 mg TID- if cannot come to a decent improvement in pain, will need to switch back- if doesn't work, switch back to MS Contin but do 30 mg TID, instead of 45 mg BID.  4. Mood/Behavior/Sleep: LCSW to follow for evaluation and support.              -antipsychotic agents: N/A 5. Neuropsych/cognition: This patient is capable of making decisions on his own behalf. 6. Skin/Wound Care: Monitor back incision for healing.     --Routine pressure relief measures.  7. Fluids/Electrolytes/Nutrition: Monitor I/O. Wife to bring food from home as does not like food her.  --Check CMET in am.  8. MRSA bacteremia: Continue Cefepime for 6 weeks w/end date 10/             --lifelong suppression w/doxycycline and cipro?  9. T2DM: Hgb A1c--7.1. Will monitor BS ac/hs- has libre and  does not want finger stuck-.  Did get couple of doses of steroids with BS labile. --continue Levemir and SSI has been refused by patient-->people die in -hospital. -=He was able to get A1C down to  7 by diet and plans to continue that. ---will continue to educate on importance of BS control.   9/14- BG control decent- con't regimen and monitoring 10. Leucocytosis: WBC has been ranging from 10-11 range. Repeat WBC tomorrow  9/14- WBC 13k- hasn't been checked in 11 days- will recheck again in am to see if going up or down.  11. Constipation?: No BM documented  --per patient bowels have been working and feels that NO laxative needed.  \  9/14- says LBM last night- refuses bowel meds.  12. Itching  9/14- asked for atarax to be scheduled q8 hours.    I spent a total of   51 minutes on total care today- >50% coordination of care- due to d/w pharmacy and pt at length about pain control and itching       LOS: 1 days A FACE TO FACE EVALUATION WAS PERFORMED  Illana Nolting 05/09/2022, 8:14 PM

## 2022-05-10 LAB — CBC WITH DIFFERENTIAL/PLATELET
Abs Immature Granulocytes: 0.05 10*3/uL (ref 0.00–0.07)
Basophils Absolute: 0.1 10*3/uL (ref 0.0–0.1)
Basophils Relative: 1 %
Eosinophils Absolute: 1.7 10*3/uL — ABNORMAL HIGH (ref 0.0–0.5)
Eosinophils Relative: 15 %
HCT: 27.6 % — ABNORMAL LOW (ref 39.0–52.0)
Hemoglobin: 8.7 g/dL — ABNORMAL LOW (ref 13.0–17.0)
Immature Granulocytes: 0 %
Lymphocytes Relative: 22 %
Lymphs Abs: 2.5 10*3/uL (ref 0.7–4.0)
MCH: 25.2 pg — ABNORMAL LOW (ref 26.0–34.0)
MCHC: 31.5 g/dL (ref 30.0–36.0)
MCV: 80 fL (ref 80.0–100.0)
Monocytes Absolute: 1 10*3/uL (ref 0.1–1.0)
Monocytes Relative: 8 %
Neutro Abs: 6.3 10*3/uL (ref 1.7–7.7)
Neutrophils Relative %: 54 %
Platelets: 468 10*3/uL — ABNORMAL HIGH (ref 150–400)
RBC: 3.45 MIL/uL — ABNORMAL LOW (ref 4.22–5.81)
RDW: 15.4 % (ref 11.5–15.5)
WBC: 11.6 10*3/uL — ABNORMAL HIGH (ref 4.0–10.5)
nRBC: 0 % (ref 0.0–0.2)

## 2022-05-10 MED ORDER — DULOXETINE HCL 30 MG PO CPEP
30.0000 mg | ORAL_CAPSULE | Freq: Every day | ORAL | Status: AC
  Administered 2022-05-10 – 2022-05-14 (×5): 30 mg via ORAL
  Filled 2022-05-10 (×5): qty 1

## 2022-05-10 MED ORDER — SODIUM CHLORIDE 0.9 % IV SOLN: INTRAVENOUS | Status: DC | PRN

## 2022-05-10 MED ORDER — DULOXETINE HCL 60 MG PO CPEP
60.0000 mg | ORAL_CAPSULE | Freq: Every day | ORAL | Status: DC
  Administered 2022-05-15 – 2022-05-16 (×2): 60 mg via ORAL
  Filled 2022-05-10 (×2): qty 1

## 2022-05-10 NOTE — Progress Notes (Signed)
Inpatient Rehabilitation Care Coordinator Assessment and Plan Patient Details  Name: John Ware MRN: [id number] Date of Birth: [birth year]  Today's Date: 05/10/2022  Hospital Problems: Principal Problem:   Osteomyelitis of lumbar spine [facility])  Past Medical History:  Past Medical History:  Diagnosis Date   Depression    Diabetes mellitus without complication ([place])    Hypertension    Spinal stenosis    With Neurogenic Claudication   Past Surgical History:  Past Surgical History:  Procedure Laterality Date   CARPAL TUNNEL RELEASE Bilateral    INCISION AND DRAINAGE ABSCESS Left 03/12/2021   Procedure: INCISION AND DRAINAGE ABSCESS;  Surgeon: Marchia Bond, MD;  Location: [facility];  Service: Orthopedics;  Laterality: Left;   IR FLUORO GUIDED NEEDLE PLC ASPIRATION/INJECTION LOC  04/24/2022   IR US GUIDE BX ASP/DRAIN  02/02/2021   IRRIGATION AND DEBRIDEMENT ABSCESS Left 02/08/2021   Procedure: MINOR INCISION AND DRAINAGE OF ABSCESS;  Surgeon: Marchia Bond, MD;  Location: [facility];  Service: Orthopedics;  Laterality: Left;   IRRIGATION AND DEBRIDEMENT ABSCESS Left 02/10/2021   Procedure: IRRIGATION AND DEBRIDEMENT ABSCESS LEFT LATERAL CHEST WALL;  Surgeon: Greer Pickerel, MD;  Location: [facility];  Service: General;  Laterality: Left;   IRRIGATION AND DEBRIDEMENT ABSCESS Bilateral 02/20/2021   Procedure: MINOR INCISION AND DRAINAGE OF ABSCESS/WITH BILATERAL WOUND VAC PLACEMENT;  Surgeon: Marchia Bond, MD;  Location: [facility];  Service: Orthopedics;  Laterality: Bilateral;   IRRIGATION AND DEBRIDEMENT SHOULDER Bilateral 02/28/2021   Procedure: IRRIGATION AND DEBRIDEMENT SHOULDER;  Surgeon: Hiram Gash, MD;  Location: [facility];  Service: Orthopedics;  Laterality: Bilateral;   IRRIGATION AND DEBRIDEMENT SHOULDER Left 03/06/2021   Procedure: IRRIGATION AND DEBRIDEMENT SHOULDER;  Surgeon: Renette Butters, MD;  Location: [facility];  Service: Orthopedics;  Laterality: Left;   IRRIGATION AND DEBRIDEMENT  SHOULDER Left 03/08/2021   Procedure: IRRIGATION AND DEBRIDEMENT SHOULDER;  Surgeon: Marchia Bond, MD;  Location: [facility];  Service: Orthopedics;  Laterality: Left;   LUMBAR LAMINECTOMY FOR EPIDURAL ABSCESS N/A 05/02/2022   Procedure: THORACIC  LAMINECTOMY AND DEBRIDEMENT  FOR EPIDURAL ABSCESS;  Surgeon: Kristeen Miss, MD;  Location: [place];  Service: Neurosurgery;  Laterality: N/A;   LUMBAR LAMINECTOMY/DECOMPRESSION MICRODISCECTOMY N/A 12/13/2014   Procedure: LUMBAR THREE-FOUR, LUMBAR FOUR-FIVE, LUMBAR FIVE-SACRAL ONE LAMINECTOMY;  Surgeon: Kristeen Miss, MD;  Location: [place] [facility];  Service: Neurosurgery;  Laterality: N/A;  L3-4 L4-5 L5-S1 Laminectomy   LUMBAR LAMINECTOMY/DECOMPRESSION MICRODISCECTOMY Left 06/05/2021   Procedure: Left lumbar one-two, Lumbar two-three Laminectomy;  Surgeon: Kristeen Miss, MD;  Location: [place];  Service: Neurosurgery;  Laterality: Left;   MENISCUS REPAIR     Right knee   MINOR APPLICATION OF WOUND VAC Left 02/20/2021   Procedure: MINOR APPLICATION OF KERLIX PACKING/REMOVAL OF WOUND VAC;  Surgeon: Johnathan Hausen, MD;  Location: [facility];  Service: General;  Laterality: Left;   TEE WITHOUT CARDIOVERSION N/A 02/05/2021   Procedure: TRANSESOPHAGEAL ECHOCARDIOGRAM (TEE);  Surgeon: Sueanne Margarita, MD;  Location: [facility];  Service: Cardiovascular;  Laterality: N/A;   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 His smoking use included cigarettes. He has a 30.00 pack-year smoking history. He has never  used smokeless tobacco. He reports current alcohol use. He reports that he does not use drugs.  Family / Support Systems Marital Status: Married How Long?: 15 years Patient  Roles: Spouse, Parent Spouse/Significant Other: [name] (wife) Children: No biological children. Adopted nephew's exgirlfriend's daughter- Baldo Ash (33 yrs old); has two dogs. Other Supports: none reported Anticipated Caregiver: Wife Ability/Limitations of Caregiver: Wife works from home but able to provide 24/ 7 care Caregiver Availability: 24/7 Family Dynamics: Pt lives with wife and adopted child- Ameila.  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 Education: [place] - How often do you need to have someone help you when you read instructions, pamphlets, or other written material from your doctor or pharmacy?: Never Writes: Yes Employment Status: Disabled Date Retired/Disabled/Unemployed: 2021 Legal History/Current Legal Issues: Pt was charged with misues of 911 due to phone issues and reports case was dismissed. Denies any other incidents. Guardian/Conservator: N/A   Abuse/Neglect Abuse/Neglect Assessment Can Be Completed: Yes Physical Abuse: Denies Verbal Abuse: Denies Sexual Abuse: Denies Exploitation of patient/patient's resources: Denies Self-Neglect: Denies  Patient response to: Social Isolation - How often do you feel lonely or isolated from those around you?: Never  Emotional Status Pt's affect, behavior and adjustment status: Pt in good spirits at time of visit and looking forward to walking. Recent Psychosocial Issues: Admits to depression. States PCP prescribed med today- Cymblata. He understands this will also help with pain. Psychiatric History: Denies Substance Abuse History: Pt denies using tobacco product use. ADmits he quit drinking alcohol two months ago; denies rec drug use. Per EMR, pt has a 30 yrs smoking hx and has quit for long periods of time in between.  Patient / Family Perceptions, Expectations & Goals Pt/Family  understanding of illness & functional limitations: Pt and family have a general understanding of his care needs Premorbid pt/family roles/activities: Independent Anticipated changes in roles/activities/participation: Assistance with ADLs/IADLs Pt/family expectations/goals: Pt goal is to work on walking and getting rid of infection.  Community Resources Express Scripts: None Premorbid Home Care/DME Agencies: None Transportation available at discharge: Wife Is the patient able to respond to transportation needs?: Yes In the past 12 months, has lack of transportation kept you from medical appointments or from getting medications?: No In the past 12 months, has lack of transportation kept you from meetings, work, or from getting things needed for daily living?: No Resource referrals recommended: Neuropsychology  Discharge Planning Living Arrangements: Spouse/significant other, Children Support Systems: Spouse/significant other, Children Type of Residence: Private residence Insurance Resources: Multimedia programmer (specify) ([place] Medicare) Financial Resources: Social Security, Family Support Financial Screen Referred: No Living Expenses: Own Money Management: Spouse Does the patient have any problems obtaining your medications?: No Home Management: Pt reports his wife manages most of the homecare needs, however, he will cook when he is able too. Patient/Family Preliminary Plans: TBD Care Coordinator Barriers to Discharge: Decreased caregiver support, Lack of/limited family support, Insurance for SNF coverage, IV antibiotics Care Coordinator Anticipated Follow Up Needs: HH/OP Expected length of stay: 10-12 days  Clinical Impression SW met with pt and pt wife in room to introduce self, explain role, and discuss discharge process. Pt is familiar with CIR due to previous admission. Pt is not a English as a second language teacher. No HCPOA. Has forms in room, and will notifying nursing when they would like to speak with  chaplain services. DME: Rollator, RW, w/c, shower chair, tub transfer bench,  cane, and handicap toilet seats. Pt aware he will d/c on IV abx which ends on 10/19.  SW spoke with Pam Chandler/[facility] to discuss referral. She will follow pt. SW will follow-up with discharge updates once available.   Christinia Lambeth A Geriann Lafont 05/10/2022, 1:34 PM

## 2022-05-10 NOTE — Care Management (Signed)
Inpatient Rehabilitation Center Individual Statement of Services  Patient Name:  John Ware  Date:  05/10/2022  Welcome to the Inpatient Rehabilitation Center.  Our goal is to provide you with an individualized program based on your diagnosis and situation, designed to meet your specific needs.  With this comprehensive rehabilitation program, you will be expected to participate in at least 3 hours of rehabilitation therapies Monday-Friday, with modified therapy programming on the weekends.  Your rehabilitation program will include the following services:  Physical Therapy (PT), Occupational Therapy (OT), 24 hour per day rehabilitation nursing, Therapeutic Recreaction (TR), Psychology, Neuropsychology, Care Coordinator, Rehabilitation Medicine, Nutrition Services, Pharmacy Services, and Other  Weekly team conferences will be held on Tuesdays to discuss your progress.  Your Inpatient Rehabilitation Care Coordinator will talk with you frequently to get your input and to update you on team discussions.  Team conferences with you and your family in attendance may also be held.  Expected length of stay: 10-12 days    Overall anticipated outcome: Supervision  Depending on your progress and recovery, your program may change. Your Inpatient Rehabilitation Care Coordinator will coordinate services and will keep you informed of any changes. Your Inpatient Rehabilitation Care Coordinator's name and contact numbers are listed  below.  The following services may also be recommended but are not provided by the Inpatient Rehabilitation Center:  Driving Evaluations Home Health Rehabiltiation Services Outpatient Rehabilitation Services Vocational Rehabilitation   Arrangements will be made to provide these services after discharge if needed.  Arrangements include referral to agencies that provide these services.  Your insurance has been verified to be:  [facility] Medicare  Your primary doctor is:  Jannifer Rodney  Pertinent information will be shared with your doctor and your insurance company.  Inpatient Rehabilitation Care Coordinator:  Susie Cassette [phone] or [phone]  Information discussed with and copy given to patient by: Gretchen Short, 05/10/2022, 10:20 AM

## 2022-05-10 NOTE — Plan of Care (Signed)
  Problem: Consults Goal: RH SPINAL CORD INJURY PATIENT EDUCATION Description:  See Patient Education module for education specifics.  Outcome: Progressing Goal: Skin Care Protocol Initiated - if Braden Score 18 or less Description: If consults are not indicated, leave blank or document N/A Outcome: Progressing Goal: Diabetes Guidelines if Diabetic/Glucose > 140 Description: If diabetic or lab glucose is > 140 mg/dl - Initiate Diabetes/Hyperglycemia Guidelines & Document Interventions  Outcome: Progressing   Problem: SCI BOWEL ELIMINATION Goal: RH STG MANAGE BOWEL WITH ASSISTANCE Description: STG Manage Bowel with min Assistance. Outcome: Progressing Goal: RH STG SCI MANAGE BOWEL WITH MEDICATION WITH ASSISTANCE Description: STG SCI Manage bowel with medication with min assistance. Outcome: Progressing   Problem: SCI BLADDER ELIMINATION Goal: RH STG MANAGE BLADDER WITH ASSISTANCE Description: STG Manage Bladder With min Assistance Outcome: Progressing   Problem: RH SKIN INTEGRITY Goal: RH STG SKIN FREE OF INFECTION/BREAKDOWN Description: Skin will be free of infection/breakdown with min assist Outcome: Progressing Goal: RH STG MAINTAIN SKIN INTEGRITY WITH ASSISTANCE Description: STG Maintain Skin Integrity With min Assistance. Outcome: Progressing   Problem: RH SAFETY Goal: RH STG ADHERE TO SAFETY PRECAUTIONS W/ASSISTANCE/DEVICE Description: STG Adhere to Safety Precautions With min Assistance/Device. Outcome: Progressing   Problem: RH PAIN MANAGEMENT Goal: RH STG PAIN MANAGED AT OR BELOW PT'S PAIN GOAL Description: Pain will be managed at 4 out of 10 on pain scale with prn medications Outcome: Progressing   Problem: RH KNOWLEDGE DEFICIT SCI Goal: RH STG INCREASE KNOWLEDGE OF SELF CARE AFTER SCI Description: Patient/ caregiver will be able to verbalize medications and importance to continue chronic medications to reduce the risk of reinfection.  Outcome: Progressing    Problem: Education: Goal: Ability to verbalize activity precautions or restrictions will improve Outcome: Progressing Goal: Knowledge of the prescribed therapeutic regimen will improve Outcome: Progressing Goal: Understanding of discharge needs will improve Outcome: Progressing   Problem: Activity: Goal: Ability to avoid complications of mobility impairment will improve Outcome: Progressing Goal: Ability to tolerate increased activity will improve Outcome: Progressing Goal: Will remain free from falls Outcome: Progressing   Problem: Bowel/Gastric: Goal: Gastrointestinal status for postoperative course will improve Outcome: Progressing   Problem: Clinical Measurements: Goal: Ability to maintain clinical measurements within normal limits will improve Outcome: Progressing Goal: Postoperative complications will be avoided or minimized Outcome: Progressing Goal: Diagnostic test results will improve Outcome: Progressing   Problem: Pain Management: Goal: Pain level will decrease Outcome: Progressing   Problem: Skin Integrity: Goal: Will show signs of wound healing Outcome: Progressing   Problem: Health Behavior/Discharge Planning: Goal: Identification of resources available to assist in meeting health care needs will improve Outcome: Progressing   Problem: Bladder/Genitourinary: Goal: Urinary functional status for postoperative course will improve Outcome: Progressing

## 2022-05-10 NOTE — Progress Notes (Signed)
PROGRESS NOTE   Subjective/Complaints:  Pt reports burning pain- between abd and hips B/L- in band across/around middle. Is at T10/11 actually, where had surgery.  Like a match lit on fire.   However slept better- 10pm to 5:30- which sounds like methadone has been somewhat helpful already.   Will start Cymbalta as well to help  ROS:  Pt denies SOB, abd pain, CP, N/V/C/D, and vision changes    Objective:   No results found. Recent Labs    05/09/22 0358 05/10/22 0343  WBC 13.0* 11.6*  HGB 9.1* 8.7*  HCT 28.0* 27.6*  PLT 492* 468*   Recent Labs    05/09/22 0358  NA 137  K 3.9  CL 99  CO2 28  GLUCOSE 153*  BUN 7  CREATININE 0.53*  CALCIUM 9.2    Intake/Output Summary (Last 24 hours) at 05/10/2022 0835 Last data filed at 05/10/2022 0647 Gross per 24 hour  Intake 240 ml  Output 2100 ml  Net -1860 ml        Physical Exam: Vital Signs Blood pressure 116/79, pulse 68, temperature 98.1 F (36.7 C), temperature source Oral, resp. rate 18, height 5\' 10"  (1.778 m), weight 71.7 kg, SpO2 99 %.     General: awake, alert, appropriate, supine in bed; NAD HENT: conjugate gaze; oropharynx moist CV: regular rate; no JVD Pulmonary: CTA B/L; no W/R/R- good air movement GI: soft, NT, ND, (+)BS- no wounds to give another explanation of nerve pain Psychiatric: appropriate- bright affect with me Neurological: Ox3  Musculoskeletal:     Comments: Left shoulder with atrophy and limited ROM. Biceps 3/5. LLE ataxic.  Otherwise with 5/5 strength. Skin:    Comments: Back incision C/D/I with two fluctuant areas (decreased per wife) - very puffy- no drainage- no redness- Buttocks- looks like healing heat rash Neurological:     Mental Status: He is alert and oriented to person, place, and time. Speech is fluent       Assessment/Plan: 1. Functional deficits which require 3+ hours per day of interdisciplinary therapy in  a comprehensive inpatient rehab setting. Physiatrist is providing close team supervision and 24 hour management of active medical problems listed below. Physiatrist and rehab team continue to assess barriers to discharge/monitor patient progress toward functional and medical goals  Care Tool:  Bathing    Body parts bathed by patient: Right arm, Left arm, Abdomen, Chest, Front perineal area, Left upper leg, Right upper leg, Face, Left lower leg, Right lower leg   Body parts bathed by helper: Buttocks     Bathing assist Assist Level: Minimal Assistance - Patient > 75%     Upper Body Dressing/Undressing Upper body dressing        Upper body assist Assist Level: Supervision/Verbal cueing    Lower Body Dressing/Undressing Lower body dressing      What is the patient wearing?: Pants     Lower body assist Assist for lower body dressing: Minimal Assistance - Patient > 75%     Toileting Toileting    Toileting assist Assist for toileting: Minimal Assistance - Patient > 75%     Transfers Chair/bed transfer  Transfers assist  Chair/bed transfer assist level: Minimal Assistance - Patient > 75%     Locomotion Ambulation   Ambulation assist      Assist level: Minimal Assistance - Patient > 75% Assistive device: Walker-rolling Max distance: 30 ft   Walk 10 feet activity   Assist     Assist level: Minimal Assistance - Patient > 75% Assistive device: Walker-rolling   Walk 50 feet activity   Assist Walk 50 feet with 2 turns activity did not occur: Safety/medical concerns         Walk 150 feet activity   Assist Walk 150 feet activity did not occur: Safety/medical concerns         Walk 10 feet on uneven surface  activity   Assist Walk 10 feet on uneven surfaces activity did not occur: Safety/medical concerns         Wheelchair     Assist Is the patient using a wheelchair?: Yes Type of Wheelchair: Manual    Wheelchair assist level:  Set up assist Max wheelchair distance: 250 ft    Wheelchair 50 feet with 2 turns activity    Assist        Assist Level: Supervision/Verbal cueing   Wheelchair 150 feet activity     Assist      Assist Level: Supervision/Verbal cueing   Blood pressure 116/79, pulse 68, temperature 98.1 F (36.7 C), temperature source Oral, resp. rate 18, height 5\' 10"  (1.778 m), weight 71.7 kg, SpO2 99 %.  Medical Problem List and Plan: 1. 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  Antithrombotics: -DVT/anticoagulation:  Mechanical: Sequential compression devices, below knee Bilateral lower extremities             -antiplatelet therapy: N/A 3. Pain: continue prn oxycodone.   9/14- will change ms contin to methadone 10 mg TID- if cannot come to a decent improvement in pain, will need to switch back- if doesn't work, switch back to MS Contin but do 30 mg TID, instead of 45 mg BID.   9/15- pt reports pain still really bothering him- in at level SCI pain- at T10/11- but explained MS Contin won't help as much. Methadone will change in 4 days to 15 mg BID- can change back if need be, but would prefer BID since methadone increases half life over 7 days.    4. Mood/Behavior/Sleep: LCSW to follow for evaluation and support.              -antipsychotic agents: N/A 5. Neuropsych/cognition: This patient is capable of making decisions on his own behalf. 6. Skin/Wound Care: Monitor back incision for healing.     --Routine pressure relief measures.  7. Fluids/Electrolytes/Nutrition: Monitor I/O. Wife to bring food from home as does not like food her.  --Check CMET in am.  8. MRSA bacteremia: Continue Cefepime for 6 weeks w/end date 10/             --lifelong suppression w/doxycycline and cipro?  9. T2DM: Hgb A1c--7.1. Will  monitor BS ac/hs- has libre and does not want finger stuck-.  Did get couple of doses of steroids with BS labile. --continue Levemir and SSI has been refused by patient-->people die  in -hospital. -=He was able to get A1C down to 7 by diet and plans to continue that. ---will continue to educate on importance of BS control.   9/14- BG control decent- con't regimen and monitoring 10. Leucocytosis: WBC has been ranging from 10-11 range. Repeat WBC tomorrow  9/14- WBC 13k- hasn't been checked in 11 days- will recheck again in am to see if going up or down.   9/15- WBC down to 11.6k- so will con't to monitor weekly.  11. Constipation?: No BM documented  --per patient bowels have been working and feels that NO laxative needed.  \  9/14- says LBM last night- refuses bowel meds.  12. Itching  9/14- asked for atarax to be scheduled q8 hours.  13. At level SCI pain  9/15- start Cymbalta 30 mg QHS x 5 days then 60 mg QHS.   I spent a total of  35  minutes on total care today- >50% coordination of care- due to IPOC and prolonged d/w pt about at level SCI pain     LOS: 2 days A FACE TO FACE EVALUATION WAS PERFORMED  Kahliyah Dick 05/10/2022, 8:35 AM

## 2022-05-10 NOTE — IPOC Note (Signed)
Overall Plan of Care [facility]) Patient Details Name: John Ware MRN: [id number] DOB: 1966-01-23  Admitting Diagnosis: Osteomyelitis of lumbar spine [facility])  Hospital Problems: Principal Problem:   Osteomyelitis of lumbar spine (HCC)     Functional Problem List: Nursing Bladder, Bowel, Pain, Perception, Edema, Safety, Endurance, Sensory, Medication Management, Skin Integrity, Motor  PT Pain, Safety, Endurance, Motor, Sensory  OT Balance, Pain, Safety, Endurance, Motor  SLP    TR         Basic ADL's: OT Bathing, Dressing, Toileting     Advanced  ADL's: OT       Transfers: PT Bed Mobility, Bed to Chair, Banker, Toilet     Locomotion: PT Ambulation, Stairs     Additional Impairments: OT  (weakness in LUE)  SLP        TR      Anticipated Outcomes Item Anticipated Outcome  Self Feeding set-up assistance  Swallowing      Basic self-care  modified independence for dressing, supervision for bathing  Toileting  supervision   Bathroom Transfers supervision  Bowel/Bladder  continent x 2  Transfers  Supervision with RW  Locomotion  supervision with RW  Communication     Cognition     Pain  less than 4  Safety/Judgment  remain fall free while in rehab   Therapy Plan: PT Intensity: Minimum of 1-2 x/day ,45 to 90 minutes PT Frequency: 5 out of 7 days PT Duration Estimated Length of Stay: 10-12 days OT Intensity: Minimum of 1-2 x/day, 45 to 90 minutes OT Frequency: 5 out of 7 days OT Duration/Estimated Length of Stay: 10 - 12 days     Team Interventions: Nursing Interventions Patient/Family Education, Disease Management/Prevention, Skin Care/Wound Management, Discharge Planning, Bladder Management, Pain Management, Psychosocial Support, Bowel Management, Medication Management  PT interventions Ambulation/gait training, Discharge planning, DME/adaptive equipment instruction, Functional mobility training, Pain management, Psychosocial support,  Therapeutic Activities, UE/LE Strength taining/ROM, UE/LE Coordination activities, Wheelchair propulsion/positioning, Therapeutic Exercise, Stair training, Skin care/wound management, Patient/family education, Neuromuscular re-education, Disease management/prevention, Firefighter, Warden/ranger  OT Interventions Warden/ranger, Patient/family education, Self Care/advanced ADL retraining, Therapeutic Exercise, UE/LE Coordination activities, Wheelchair propulsion/positioning, Discharge planning, DME/adaptive equipment instruction, Functional mobility training, Psychosocial support, Pain management, Therapeutic Activities, UE/LE Strength taining/ROM  SLP Interventions    TR Interventions    SW/CM Interventions Discharge Planning, Psychosocial Support, Patient/Family Education   Barriers to Discharge MD  Medical stability, Home enviroment access/loayout, IV antibiotics, Incontinence, Neurogenic bowel and bladder, Wound care, Lack of/limited family support, Weight bearing restrictions, Medication compliance, and Behavior  Nursing Decreased caregiver support, IV antibiotics, Incontinence, Wound Care 1 level house with ramp entrance  PT Decreased caregiver support, Wound Care    OT      SLP      SW IV antibiotics     Team Discharge Planning: Destination: PT-Home ,OT- Home , SLP-  Projected Follow-up: PT-Home health PT, OT-  Home health OT, 24 hour supervision/assistance, SLP-  Projected Equipment Needs: PT-None recommended by PT, OT-  , SLP-  Equipment Details: PT-Pt owns required equipment from previous stay, OT-TBD - pt reports having manual w/c,FWW, rollator, shower chair, and BSC Patient/family involved in discharge planning: PT- Patient,  OT-Patient, SLP-   MD ELOS: 10-12 days Medical Rehab Prognosis:  Good Assessment: The patient has been admitted for CIR therapies with the diagnosis of osteomyelitis of spine. The team will be addressing functional  mobility, strength, stamina, balance, safety, adaptive techniques and equipment, self-care, bowel  and bladder mgt, patient and caregiver education, SCI education. Goals have been set at supervision. Anticipated discharge destination is home.        See Team Conference Notes for weekly updates to the plan of care

## 2022-05-10 NOTE — Progress Notes (Signed)
Physical Therapy Session Note  Patient Details  Name: John Ware MRN: [id number] Date of Birth: Apr 29, 1966  Today's Date: 05/10/2022 PT Individual Time: 0800-0900, 1430-1445 PT Individual Time Calculation (min): 60 min, 75 min   Short Term Goals: Week 1:  PT Short Term Goal 1 (Week 1): pt will ambulate >50 ft with LRAD PT Short Term Goal 2 (Week 1): Pt navigate 6" stair x 2 in preparation for stair navigation PT Short Term Goal 3 (Week 1): Pt will perform >20 minutes of activity before requiring rest break  Skilled Therapeutic Interventions/Progress Updates:    Session 1: pt received in bed and agreeable to therapy. Pt reports 3/10 pain on arrival, premedicated. Rest and positioning provided as needed. Pt transported to therapy gym for time management and energy conservation. Session focused on BLE strength and conditioning. Pt performed squats with RW and verbal cues for technique 4 x 6 with 2 min rest break. Discussed strength and conditioning concepts throughout. Pt then transitioned step ups on 6" step with BIL hand rails, 2 x 4 BIL. Pt propelled chair back to room and returned to bed with CGA ambulatory transfer with RW. Supine<>sit with supervision, was left with all needs in reach and alarm active.   Session 2: Pt seated in w/c on arrival and agreeable to therapy. Pt reports 7/10 pain, premedicated. Rest and positioning provided as needed. Session focused on global strength and endurance. Pt propelled w/c with BUE for endurance and functional mobility. Pt performed LAQ with 2.5 lb ankle weight 4 x 8. Focus on improving ataxic movements and avoiding ankle inversion with LLE. Progressed to straight leg raise in sitting over small weight 4 x 10 for improved hip flexion strength and coordination. Pt performed 3 x 20 seated marches with ankle weight for improved hip flexion strength. Pt then propelled w/c with BLE with ankle weights x 40 ft and then ~100 ft without for HS activation and  coordination. Pt demoes good awareness, noting scissoring pattern that is also present in gait. Pt performed Stand pivot transfer with RW and CGA. Noted decr coordination with stepping d/t fatigue but pt was able to stand without assist to power up. Pt utilized nustep 2 x 5 min at level 4 with BLE only for strength. Stand pivot transfer back to chair with min A for balance d/t fatigue. Pt self propelled to room after session and remained in w/c, was left with all needs in reach.   Therapy Documentation Precautions:  Precautions Precautions: Back, Fall Precaution Booklet Issued: No Precaution Comments: impulsivity, motor control deficits for LLE, verbally reviewed precautions, pt recalled 3/3 Required Braces or Orthoses:  (per chart review - no brace needed per MD) Restrictions Weight Bearing Restrictions: No General:     Therapy/Group: Individual Therapy  Juluis Rainier 05/10/2022, 12:57 PM

## 2022-05-10 NOTE — Progress Notes (Signed)
Occupational Therapy Session Note  Patient Details  Name: John Ware MRN: [id number] Date of Birth: 1965/12/12  Today's Date: 05/10/2022 OT Individual Time: 1000-1045 OT Individual Time Calculation (min): 45 min  and Today's Date: 05/10/2022 OT Missed Time: 30 Minutes Missed Time Reason: Other (comment) (pt did not want to start until his wife arrived at 10)   Short Term Goals: Week 1:  OT Short Term Goal 1 (Week 1): Pt will perform toileting with CGA with use of LRAD and grab bars as needed. OT Short Term Goal 2 (Week 1): Pt will perform toilet transfer with CGA for safety with LRAD and grab bars as needed. OT Short Term Goal 3 (Week 1): Pt will complete UB/LB dressing with CGA with use of AE as needed while maintaining spinal precautions.  Skilled Therapeutic Interventions/Progress Updates:    Pt resting in bed upon arrival. Pt reports that he does not want to start until 10 am (we had discussed previous day.) After wife arrived, pt engaged in bathing at shower level and dressing with sit<>stand from TTB. Pt amb with RW into bathroom (min A) and completed bathing with min A (wife assisted). Pt's wife assisted with LB dressing tasks before returning to EOB to don shirt. Sit<>stand with min A/CGA throughout session. Grooming w/c level at sink. Pt appreciative for opportunity to take shower. Pt and wife pleased with progress. Pt reamined in bed with wife present. All needs within reach.   Therapy Documentation Precautions:  Precautions Precautions: Back, Fall Precaution Booklet Issued: No Precaution Comments: impulsivity, motor control deficits for LLE, verbally reviewed precautions, pt recalled 3/3 Required Braces or Orthoses:  (per chart review - no brace needed per MD) Restrictions Weight Bearing Restrictions: No General: General OT Amount of Missed Time: 30 Minutes Pain:  Pt reports 7/10 pain in lower back; pt reports he is scheduled for pain meds at 11   Therapy/Group:  Individual Therapy  Rich Brave 05/10/2022, 10:46 AM

## 2022-05-10 NOTE — Progress Notes (Signed)
Patient and wife reporting increase in swelling around incision. Incision intact without erythema or warmth. No fevers or chills and he does not report any  increase in pain or tightness. Discussed post op fluid likely distributing downwards due to gravity as before he had a lot of swelling above the incision. Also discussed with Dr. Berline Chough. Will monitor for now. Left message asking NS to evaluate wound for input.  CBC and sed rate ordered for am.

## 2022-05-10 NOTE — Progress Notes (Signed)
Patient refused accucheck with facility machine due to having freestyle Libre and stated that he does not need insulin. He controls blood glucose with diet. Cletis Media, RN

## 2022-05-11 LAB — CBC WITH DIFFERENTIAL/PLATELET
Abs Immature Granulocytes: 0.07 10*3/uL (ref 0.00–0.07)
Basophils Absolute: 0.1 10*3/uL (ref 0.0–0.1)
Basophils Relative: 1 %
Eosinophils Absolute: 1.6 10*3/uL — ABNORMAL HIGH (ref 0.0–0.5)
Eosinophils Relative: 15 %
HCT: 27.5 % — ABNORMAL LOW (ref 39.0–52.0)
Hemoglobin: 8.7 g/dL — ABNORMAL LOW (ref 13.0–17.0)
Immature Granulocytes: 1 %
Lymphocytes Relative: 18 %
Lymphs Abs: 2 10*3/uL (ref 0.7–4.0)
MCH: 25.4 pg — ABNORMAL LOW (ref 26.0–34.0)
MCHC: 31.6 g/dL (ref 30.0–36.0)
MCV: 80.2 fL (ref 80.0–100.0)
Monocytes Absolute: 1 10*3/uL (ref 0.1–1.0)
Monocytes Relative: 10 %
Neutro Abs: 6 10*3/uL (ref 1.7–7.7)
Neutrophils Relative %: 55 %
Platelets: 477 10*3/uL — ABNORMAL HIGH (ref 150–400)
RBC: 3.43 MIL/uL — ABNORMAL LOW (ref 4.22–5.81)
RDW: 15.6 % — ABNORMAL HIGH (ref 11.5–15.5)
WBC: 10.7 10*3/uL — ABNORMAL HIGH (ref 4.0–10.5)
nRBC: 0 % (ref 0.0–0.2)

## 2022-05-11 LAB — SEDIMENTATION RATE: Sed Rate: 58 mm/hr — ABNORMAL HIGH (ref 0–16)

## 2022-05-11 NOTE — Progress Notes (Addendum)
PROGRESS NOTE   Subjective/Complaints:  Patient seen at bedside. No acute complaints. No events overnight.   Per neurosurgery note this AM: Incision is clean dry and intact without signs of infection.  No redness or induration or drainage.  Hard knot-like swelling under the incision feels like a hematoma.  We did clean it with alcohol and stuck an 18-gauge needle and it and got very little serosanguineous fluid.  I think this will resolve with time.  LBM 9/16, M     05/11/2022    4:29 AM 05/10/2022    4:03 PM 05/10/2022    4:55 AM  Vitals with BMI  Systolic [id number]  Diastolic 76 80 79  Pulse 64 81 68     ROS: Denies fevers, chills, N/V, abdominal pain, constipation, diarrhea, SOB, cough, chest pain, new weakness or paraesthesias.    Objective:   No results found. Recent Labs    05/10/22 0343 05/11/22 0421  WBC 11.6* 10.7*  HGB 8.7* 8.7*  HCT 27.6* 27.5*  PLT 468* 477*   Recent Labs    05/09/22 0358  NA 137  K 3.9  CL 99  CO2 28  GLUCOSE 153*  BUN 7  CREATININE 0.53*  CALCIUM 9.2    Intake/Output Summary (Last 24 hours) at 05/11/2022 1105 Last data filed at 05/11/2022 0813 Gross per 24 hour  Intake 913.32 ml  Output 1100 ml  Net -186.68 ml        Physical Exam: Vital Signs reviewed as above.  Constitutional: No apparent distress. Appropriate appearance for age.  HENT: Atraumatic, normocephalic. Eyes: PERRL. No apparent EOM deficits.  Cardiovascular: RRR, no murmurs/rub/gallops. No Edema. Peripheral pulses 2+  Respiratory: CTAB. No rales, rhonchi, or wheezing. On RA.  Abdomen: + bowel sounds, normoactive, nondistended  Skin: C/D/I. Spinal surgical site closed, healing well - raised area under incision non-fluctuant, no erythema, no warmth. No discoloration.  MSK: Moving all 4 limbs in bed  Neurologic exam:  Cognition: AAO to person, place, time and event.   PE from prior encounter: General:  awake, alert, appropriate, supine in bed; NAD HENT: conjugate gaze; oropharynx moist CV: regular rate; no JVD Pulmonary: CTA B/L; no W/R/R- good air movement GI: soft, NT, ND, (+)BS- no wounds to give another explanation of nerve pain Psychiatric: appropriate- bright affect with me Neurological: Ox3   Musculoskeletal:     Comments: Left shoulder with atrophy and limited ROM. Biceps 3/5. LLE ataxic.  Otherwise with 5/5 strength. Skin:    Comments: Back incision C/D/I with two fluctuant areas (decreased per wife) - very puffy- no drainage- no redness- Buttocks- looks like healing heat rash Neurological:     Mental Status: He is alert and oriented to person, place, and time. Speech is fluent       Assessment/Plan: 1. Functional deficits which require 3+ hours per day of interdisciplinary therapy in a comprehensive inpatient rehab setting. Physiatrist is providing close team supervision and 24 hour management of active medical problems listed below. Physiatrist and rehab team continue to assess barriers to discharge/monitor patient progress toward functional and medical goals  Care Tool:  Bathing    Body parts bathed by patient: Right  arm, Left arm, Abdomen, Chest, Front perineal area, Left upper leg, Right upper leg, Face, Left lower leg, Right lower leg, Buttocks   Body parts bathed by helper: Buttocks     Bathing assist Assist Level: Minimal Assistance - Patient > 75%     Upper Body Dressing/Undressing Upper body dressing   What is the patient wearing?: Pull over shirt    Upper body assist Assist Level: Supervision/Verbal cueing    Lower Body Dressing/Undressing Lower body dressing      What is the patient wearing?: Pants, Underwear/pull up     Lower body assist Assist for lower body dressing: Moderate Assistance - Patient 50 - 74%     Toileting Toileting    Toileting assist Assist for toileting: Minimal Assistance - Patient > 75%     Transfers Chair/bed  transfer  Transfers assist     Chair/bed transfer assist level: Minimal Assistance - Patient > 75%     Locomotion Ambulation   Ambulation assist      Assist level: Minimal Assistance - Patient > 75% Assistive device: Walker-rolling Max distance: 30 ft   Walk 10 feet activity   Assist     Assist level: Minimal Assistance - Patient > 75% Assistive device: Walker-rolling   Walk 50 feet activity   Assist Walk 50 feet with 2 turns activity did not occur: Safety/medical concerns         Walk 150 feet activity   Assist Walk 150 feet activity did not occur: Safety/medical concerns         Walk 10 feet on uneven surface  activity   Assist Walk 10 feet on uneven surfaces activity did not occur: Safety/medical concerns         Wheelchair     Assist Is the patient using a wheelchair?: Yes Type of Wheelchair: Manual    Wheelchair assist level: Set up assist Max wheelchair distance: 250 ft    Wheelchair 50 feet with 2 turns activity    Assist        Assist Level: Supervision/Verbal cueing   Wheelchair 150 feet activity     Assist      Assist Level: Supervision/Verbal cueing   Blood pressure 114/76, pulse 64, temperature 97.7 F (36.5 C), resp. rate 17, height 5\' 10"  (1.778 m), weight 71.7 kg, SpO2 99 %.  Assessment and plan: Medical Problem List and Plan: 1. 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  Antithrombotics: -DVT/anticoagulation:  Mechanical: Sequential compression devices, below knee Bilateral lower extremities             -antiplatelet therapy: N/A 3. Pain: continue prn oxycodone.              9/14- will change ms contin to methadone 10 mg TID- if cannot come to a decent improvement in pain, will need to switch back- if doesn't work, switch back  to MS Contin but do 30 mg TID, instead of 45 mg BID.              9/15- pt reports pain still really bothering him- in at level SCI pain- at T10/11- but explained MS Contin won't help as much. Methadone will change  in 4 days to 15 mg BID- can change back if need be, but would prefer BID since methadone increases half life over 7 days.    4. Mood/Behavior/Sleep: LCSW to follow for evaluation and support.              -antipsychotic agents: N/A 5. Neuropsych/cognition: This patient is capable of making decisions on his own behalf. 6. Skin/Wound Care: Monitor back incision for healing.     --Routine pressure relief measures.  - 9/16 - appears stable. Per neurosurgery no acute intervention, likely resolve with time.  CBC stable  7. Fluids/Electrolytes/Nutrition: Monitor I/O. Wife to bring food from home as does not like food her.  --Check CMET in am.  8. MRSA bacteremia: Continue Cefepime for 6 weeks w/end date 10/             --lifelong suppression w/doxycycline and cipro?  9. T2DM: Hgb A1c--7.1. Will monitor BS ac/hs- has libre and does not want finger stuck-.  Did get couple of doses of steroids with BS labile. --continue Levemir and SSI has been refused by patient-->people die in -hospital. -=He was able to get A1C down to 7 by diet and plans to continue that. ---will continue to educate on importance of BS control.              9/14- BG control decent- con't regimen and monitoring 10. Leucocytosis: WBC has been ranging from 10-11 range. Repeat WBC tomorrow             9/14- WBC 13k- hasn't been checked in 11 days- will recheck again in am to see if going up or down.              9/15- WBC down to 11.6k- so will con't to monitor weekly.  11. Constipation?: No BM documented  --per patient bowels have been working and feels that NO laxative needed.  \             9/14- says LBM last night- refuses bowel meds.              9/16 - Medium BM 12. Itching             9/14- asked for atarax to be  scheduled q8 hours.  13. At level SCI pain             9/15- start Cymbalta 30 mg QHS x 5 days then 60 mg QHS.              9/16 - no c/o pain    LOS: 3 days A FACE TO FACE EVALUATION WAS PERFORMED  Gertie Gowda 05/11/2022, 11:05 AM    **addendum** received notification from nursing that family was reviewing an informaiton packet in the patient's room and was upset to find literature on managing heart failure. Family was not aware patient carried this diagnosis. On chart review, was mentioned on admission that patient had "chronic diastolic heart failure" based on an echo from 05/2021, which showed grade 1 dysfunction. Printed these results and will discuss them with family today.

## 2022-05-11 NOTE — Progress Notes (Signed)
Patient ID: John Ware, male   DOB: 1966/02/28, 56 y.o.   MRN: [id number] Incision is clean dry and intact without signs of infection.  No redness or induration or drainage.  Hard knot-like swelling under the incision feels like a hematoma.  We did clean it with alcohol and stuck an 18-gauge needle and it and got very little serosanguineous fluid.  I think this will resolve with time.

## 2022-05-11 NOTE — Progress Notes (Signed)
Pt continues to refuse CHG baths

## 2022-05-12 NOTE — Progress Notes (Signed)
Pt continues to refuse finger stick blood sugar checks and prefers for his blood sugar to be monitored only per freestyle libre monitor. Pt also refused CHG bath and would rather have it done later today. Pt alert and oriented. Explained of the risks that comes with his refusal to the procedures stated above, pt verbalized understanding. Will continue to monitor pt.

## 2022-05-12 NOTE — Progress Notes (Signed)
PROGRESS NOTE   Subjective/Complaints:  Patient seen at bedside. No acute complaints. No events overnight.   Patient states "if they won't adjust my medications, I won't do my therapies. That's what I told them". When inquiring further, pt states he historically takes his medications "on time", and believes PRN status makes him get behind and therefore have too much pain to participate.   LBM 9/16, M     05/12/2022    7:55 PM 05/12/2022    2:12 PM 05/12/2022    5:48 AM  Vitals with BMI  Systolic [id number]  Diastolic 66 85 80  Pulse 66 77 64     ROS: Denies fevers, chills, N/V, abdominal pain, constipation, diarrhea, SOB, cough, chest pain, new weakness or paraesthesias.    Objective:   No results found. Recent Labs    05/10/22 0343 05/11/22 0421  WBC 11.6* 10.7*  HGB 8.7* 8.7*  HCT 27.6* 27.5*  PLT 468* 477*    No results for input(s): "NA", "K", "CL", "CO2", "GLUCOSE", "BUN", "CREATININE", "CALCIUM" in the last 72 hours.   Intake/Output Summary (Last 24 hours) at 05/12/2022 2101 Last data filed at 05/12/2022 1848 Gross per 24 hour  Intake 1516 ml  Output 2290 ml  Net -774 ml         Physical Exam: Vital Signs reviewed as above.  Constitutional: No apparent distress. Appropriate appearance for age.  HENT: Atraumatic, normocephalic. Eyes: PERRL. No apparent EOM deficits.  Cardiovascular: RRR, no murmurs/rub/gallops. No Edema. Peripheral pulses 2+  Respiratory: CTAB. No rales, rhonchi, or wheezing. On RA.  Abdomen: + bowel sounds, normoactive, nondistended  Skin: C/D/I. Spinal surgical site closed, healing well - raised area under incision non-fluctuant, no erythema, no warmth. No discoloration. Stable on subsequent exams.  MSK: Moving all 4 limbs in bed  Neurologic exam:  Cognition: AAO to person, place, time and event.   PE from prior encounter: General: awake, alert, appropriate, supine in bed;  NAD HENT: conjugate gaze; oropharynx moist CV: regular rate; no JVD Pulmonary: CTA B/L; no W/R/R- good air movement GI: soft, NT, ND, (+)BS- no wounds to give another explanation of nerve pain Psychiatric: appropriate- bright affect with me Neurological: Ox3   Musculoskeletal:     Comments: Left shoulder with atrophy and limited ROM. Biceps 3/5. LLE ataxic.  Otherwise with 5/5 strength. Skin:    Comments: Back incision C/D/I with two fluctuant areas (decreased per wife) - very puffy- no drainage- no redness- Buttocks- looks like healing heat rash Neurological:     Mental Status: He is alert and oriented to person, place, and time. Speech is fluent       Assessment/Plan: 1. Functional deficits which require 3+ hours per day of interdisciplinary therapy in a comprehensive inpatient rehab setting. Physiatrist is providing close team supervision and 24 hour management of active medical problems listed below. Physiatrist and rehab team continue to assess barriers to discharge/monitor patient progress toward functional and medical goals  Care Tool:  Bathing    Body parts bathed by patient: Right arm, Left arm, Abdomen, Chest, Front perineal area, Left upper leg, Right upper leg, Face, Left lower leg, Right lower leg, Buttocks  Body parts bathed by helper: Buttocks     Bathing assist Assist Level: Minimal Assistance - Patient > 75%     Upper Body Dressing/Undressing Upper body dressing   What is the patient wearing?: Pull over shirt    Upper body assist Assist Level: Supervision/Verbal cueing    Lower Body Dressing/Undressing Lower body dressing      What is the patient wearing?: Pants, Underwear/pull up     Lower body assist Assist for lower body dressing: Moderate Assistance - Patient 50 - 74%     Toileting Toileting    Toileting assist Assist for toileting: Independent with assistive device Assistive Device Comment: urinal   Transfers Chair/bed  transfer  Transfers assist     Chair/bed transfer assist level: Minimal Assistance - Patient > 75%     Locomotion Ambulation   Ambulation assist      Assist level: Minimal Assistance - Patient > 75% Assistive device: Walker-rolling Max distance: 30 ft   Walk 10 feet activity   Assist     Assist level: Minimal Assistance - Patient > 75% Assistive device: Walker-rolling   Walk 50 feet activity   Assist Walk 50 feet with 2 turns activity did not occur: Safety/medical concerns         Walk 150 feet activity   Assist Walk 150 feet activity did not occur: Safety/medical concerns         Walk 10 feet on uneven surface  activity   Assist Walk 10 feet on uneven surfaces activity did not occur: Safety/medical concerns         Wheelchair     Assist Is the patient using a wheelchair?: Yes Type of Wheelchair: Manual    Wheelchair assist level: Set up assist Max wheelchair distance: 250 ft    Wheelchair 50 feet with 2 turns activity    Assist        Assist Level: Supervision/Verbal cueing   Wheelchair 150 feet activity     Assist      Assist Level: Supervision/Verbal cueing   Blood pressure 102/66, pulse 66, temperature 98.1 F (36.7 C), temperature source Oral, resp. rate 16, height 5\' 10"  (1.778 m), weight 71.7 kg, SpO2 99 %.  Assessment and plan: Medical Problem List and Plan: 1. 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  Antithrombotics: -DVT/anticoagulation:  Mechanical: Sequential compression devices, below knee Bilateral lower extremities             -antiplatelet therapy: N/A 3. Pain: continue prn oxycodone.              9/14- will change ms contin to methadone 10 mg TID- if cannot come to a decent improvement in pain, will need to switch back- if  doesn't work, switch back to MS Contin but do 30 mg TID, instead of 45 mg BID.              9/15- pt reports pain still really bothering him- in at level SCI pain- at T10/11- but explained MS Contin won't help as much. Methadone will change in 4 days to 15 mg BID- can change back if need be, but would prefer BID since methadone increases half  life over 7 days.               9/17 - Patient refusing therapies d/t poor pain control. Note methadone increase pending in AM; will hold off on further medication adjustments   4. Mood/Behavior/Sleep: LCSW to follow for evaluation and support.              -antipsychotic agents: N/A 5. Neuropsych/cognition: This patient is capable of making decisions on his own behalf. 6. Skin/Wound Care: Monitor back incision for healing.     --Routine pressure relief measures.  - 9/16 - appears stable. Per neurosurgery no acute intervention, likely resolve with time.  CBC stable  7. Fluids/Electrolytes/Nutrition: Monitor I/O. Wife to bring food from home as does not like food her.  --Check CMET in am.  8. MRSA bacteremia: Continue Cefepime for 6 weeks w/end date 10/             --lifelong suppression w/doxycycline and cipro?  9. T2DM: Hgb A1c--7.1. Will monitor BS ac/hs- has libre and does not want finger stuck-.  Did get couple of doses of steroids with BS labile. --continue Levemir and SSI has been refused by patient-->people die in -hospital. -=He was able to get A1C down to 7 by diet and plans to continue that. ---will continue to educate on importance of BS control.              9/14- BG control decent- con't regimen and monitoring 10. Leucocytosis: WBC has been ranging from 10-11 range. Repeat WBC tomorrow             9/14- WBC 13k- hasn't been checked in 11 days- will recheck again in am to see if going up or down.              9/15- WBC down to 11.6k- so will con't to monitor weekly.  11. Constipation?: No BM documented  --per patient bowels have been working  and feels that NO laxative needed.  \             9/14- says LBM last night- refuses bowel meds.              9/16 - Medium BM 12. Itching             9/14- asked for atarax to be scheduled q8 hours.  13. At level SCI pain             9/15- start Cymbalta 30 mg QHS x 5 days then 60 mg QHS.              9/16, 9/17 - stable 14. Diastolic HF             3/14 - received notification from nursing that family was reviewing an informaiton packet in the patient's room and was upset to find literature on managing heart failure. Family was not aware patient carried this diagnosis. On chart review, was mentioned on admission that patient had "chronic diastolic heart failure" based on an echo from 05/2021, which showed grade 1 dysfunction. Printed these results and discussed with patient, recommended PCP f/u at DC as he remains asymptomatic.   LOS: 4 days A FACE TO [place] 05/12/2022, 9:01 PM    **addendum** r

## 2022-05-12 NOTE — Progress Notes (Signed)
Physical Therapy Session Note  Patient Details  Name: ABEDNEGO YEATES MRN: [id number] Date of Birth: [birth year]  Today's Date: 05/12/2022 PT Individual Time: 1020-1120 PT Individual Time Calculation (min): 60 min   Short Term Goals: Week 1:  PT Short Term Goal 1 (Week 1): pt will ambulate >50 ft with LRAD PT Short Term Goal 2 (Week 1): Pt navigate 6" stair x 2 in preparation for stair navigation PT Short Term Goal 3 (Week 1): Pt will perform >20 minutes of activity before requiring rest break  Skilled Therapeutic Interventions/Progress Updates: Pt presented in bed with nsg present agreeable to therapy. Pt states significantly increased pain a the moment. Pt agreeable to have PTA return later after pain meds have kicked in. Pt returned ~45 min later with pt stating pain subsided slightly but agreeable to therapy (5/10). Performed bed mobility with supervision and use of bed features. Pt doffed socks and donned shoes with set up. Pt performed ambulatory transfer to w/c with CGA. Pt propelled with day room supervision and performed ambulatory transfer to high/low mat. Pt participated in seated therex for BLE strengthening. Performed LAQ, seated hip flexion, hip abd/add 2 x 10 each with 2.5lb weight. Pt also performed adductor squeezes for lower core activation and Sit to stand x 10 for general BLE strengthening. Pt noted to initially brace BLE against mat when performing Sit to stand however was able to improve to increasing quad facilitation without bracing after 4. Pt did require intermittent rests between bouts due to pain increasing vs fatigue. Pt then ambulated to NuStep with RW and minA due to mild ataxia of LLE. Participated in NuStep L4 x 8 min for reciprocal activity and general conditioning. Performed ambulatory transfer back to w/c and propelled back to room in same manner as prior. In room performed ambulatory transfer to bed and PTA doffed shoes for time management. Pt returned to supine with  supervision using momentum to sling BLE onto bed. Pt did demonstrate poor adherence to back precautions with bed mobility during session. Pt left in bed at end of session with bed alarm on, call bell within reach and needs met.      Therapy Documentation Precautions:  Precautions Precautions: Back, Fall Precaution Booklet Issued: No Precaution Comments: impulsivity, motor control deficits for LLE, verbally reviewed precautions, pt recalled 3/3 Required Braces or Orthoses:  (per chart review - no brace needed per MD) Restrictions Weight Bearing Restrictions: No General: PT Amount of Missed Time (min): 15 Minutes PT Missed Treatment Reason: Other (Comment) Vital Signs:   Pain: Pain Assessment Pain Scale: 0-10 Pain Score: 7  Pain Type: Acute pain Pain Location: Back Pain Orientation: Mid Pain Descriptors / Indicators: Aching;Squeezing Pain Frequency: Constant Pain Onset: Progressive Pain Intervention(s): Medication (See eMAR) Mobility:   Locomotion :    Trunk/Postural Assessment :    Balance:   Exercises:   Other Treatments:      Therapy/Group: Individual Therapy  Aradhya Shellenbarger 05/12/2022, 12:24 PM

## 2022-05-12 NOTE — Progress Notes (Signed)
Occupational Therapy Session Note  Patient Details  Name: John Ware MRN: [id number] Date of Birth: 21-Nov-1965  Today's Date: 05/12/2022 OT Individual Time: 1430-1525 OT Individual Time Calculation (min): 55 min    Short Term Goals: Week 1:  OT Short Term Goal 1 (Week 1): Pt will perform toileting with CGA with use of LRAD and grab bars as needed. OT Short Term Goal 2 (Week 1): Pt will perform toilet transfer with CGA for safety with LRAD and grab bars as needed. OT Short Term Goal 3 (Week 1): Pt will complete UB/LB dressing with CGA with use of AE as needed while maintaining spinal precautions.  Skilled Therapeutic Interventions/Progress Updates:  Pt reports 7/10 pain upon OT arrival and resting in bed. Reporting not wanting to work outside of the room but agreeable to working bed level and EOB due to pain. Refused ice due to skin sensitivity and educated in position changes and was able to move to EOB briefly for relief with S and min cues for alignment. Once back to sidelying OT training with written UE HEP for 2 x per day including deep breathing integration, scapular/sh elevation and retraction, foam grasp squeezes, yellow tband for elbow extension only- all 10 reps with B Ue's with pacing, joint protection and rests between sets. OT provided all items for carryover and with short delay, pt able to teach back with min cues only. Pt refused shower offered and reported he will try for shower tomorrow. OT left pt with bed alarm active, needs and nurse call button in reach.   Therapy Documentation Precautions:  Precautions Precautions: Back, Fall Precaution Booklet Issued: No Precaution Comments: impulsivity, motor control deficits for LLE, verbally reviewed precautions, pt recalled 3/3 Required Braces or Orthoses:  (per chart review - no brace needed per MD) Restrictions Weight Bearing Restrictions: No    Therapy/Group: Individual Therapy  [name] 05/12/2022, 3:08 PM

## 2022-05-12 NOTE — Progress Notes (Signed)
Physical Therapy Session Note  Patient Details  Name: John Ware MRN: [id number] Date of Birth: 1966-04-06  Today's Date: 05/12/2022 PT Missed Time: 65 Minutes Missed Time Reason: Pain  Short Term Goals: Week 1:  PT Short Term Goal 1 (Week 1): pt will ambulate >50 ft with LRAD PT Short Term Goal 2 (Week 1): Pt navigate 6" stair x 2 in preparation for stair navigation PT Short Term Goal 3 (Week 1): Pt will perform >20 minutes of activity before requiring rest break  Skilled Therapeutic Interventions/Progress Updates:      Therapy Documentation Precautions:  Precautions Precautions: Back, Fall Precaution Booklet Issued: No Precaution Comments: impulsivity, motor control deficits for LLE, verbally reviewed precautions, pt recalled 3/3 Required Braces or Orthoses:  (per chart review - no brace needed per MD) Restrictions Weight Bearing Restrictions: No  Pt unable to participate in PT session due to pain. PT notified nurse and patient not due for medication until 3:32.    Therapy/Group: Individual Therapy  Verl Dicker Verl Dicker PT, DPT  05/12/2022, 7:57 AM

## 2022-05-13 DIAGNOSIS — D72829 Elevated white blood cell count, unspecified: Secondary | ICD-10-CM

## 2022-05-13 DIAGNOSIS — E119 Type 2 diabetes mellitus without complications: Secondary | ICD-10-CM

## 2022-05-13 DIAGNOSIS — K5900 Constipation, unspecified: Secondary | ICD-10-CM

## 2022-05-13 DIAGNOSIS — M545 Low back pain, unspecified: Secondary | ICD-10-CM

## 2022-05-13 LAB — CBC
HCT: 29.1 % — ABNORMAL LOW (ref 39.0–52.0)
Hemoglobin: 9.2 g/dL — ABNORMAL LOW (ref 13.0–17.0)
MCH: 25.3 pg — ABNORMAL LOW (ref 26.0–34.0)
MCHC: 31.6 g/dL (ref 30.0–36.0)
MCV: 79.9 fL — ABNORMAL LOW (ref 80.0–100.0)
Platelets: 503 10*3/uL — ABNORMAL HIGH (ref 150–400)
RBC: 3.64 MIL/uL — ABNORMAL LOW (ref 4.22–5.81)
RDW: 15.3 % (ref 11.5–15.5)
WBC: 9.7 10*3/uL (ref 4.0–10.5)
nRBC: 0 % (ref 0.0–0.2)

## 2022-05-13 LAB — BASIC METABOLIC PANEL
Anion gap: 10 (ref 5–15)
BUN: 9 mg/dL (ref 6–20)
CO2: 28 mmol/L (ref 22–32)
Calcium: 9.3 mg/dL (ref 8.9–10.3)
Chloride: 96 mmol/L — ABNORMAL LOW (ref 98–111)
Creatinine, Ser: 0.57 mg/dL — ABNORMAL LOW (ref 0.61–1.24)
GFR, Estimated: >60 mL/min (ref >60)
Glucose, Bld: 97 mg/dL (ref 70–99)
Potassium: 4.2 mmol/L (ref 3.5–5.1)
Sodium: 134 mmol/L — ABNORMAL LOW (ref 135–145)

## 2022-05-13 NOTE — Progress Notes (Signed)
Pt requested pain meds while nursing was with another patient. This nurse entered the room once available and educated patient that pain medications are not available until 5:50 p.m. Patient became upset and started yelling stating that I gave pain medications at 10:15 and then stated it was given at 10:35, Nursing showed patient what time PRN medication was given and patient raised voice again "Get the fuck out of my room now" once I stated that I would not get PRN medications until it was time. 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 Nursing educated pt on medication changes from t.i.d. to b.I.d. Pt yelled at nursing stating that "Your just keeping medications from me because your mad at me from last year when I was here". Pt rolled himself out of room and requested to speak to charge RN. Nursing asked for patient to go back to room and charge nurse will come to room. Pt stated that there isn't a rule that he has to stay in his room, and if nursing tries to put on a chair alarm that he will just turn it off because he knows how to do it. Pt requested different nursing for the remainder of his stay.

## 2022-05-13 NOTE — Plan of Care (Signed)
  Problem: Consults Goal: RH SPINAL CORD INJURY PATIENT EDUCATION Description:  See Patient Education module for education specifics.  Outcome: Progressing Goal: Diabetes Guidelines if Diabetic/Glucose > 140 Description: If diabetic or lab glucose is > 140 mg/dl - Initiate Diabetes/Hyperglycemia Guidelines & Document Interventions  Outcome: Progressing   Problem: SCI BLADDER ELIMINATION Goal: RH STG MANAGE BLADDER WITH ASSISTANCE Description: STG Manage Bladder With min Assistance Outcome: Progressing   Problem: RH SKIN INTEGRITY Goal: RH STG SKIN FREE OF INFECTION/BREAKDOWN Description: Skin will be free of infection/breakdown with min assist Outcome: Progressing   Problem: RH SAFETY Goal: RH STG ADHERE TO SAFETY PRECAUTIONS W/ASSISTANCE/DEVICE Description: STG Adhere to Safety Precautions With min Assistance/Device. Outcome: Progressing   Problem: Activity: Goal: Ability to avoid complications of mobility impairment will improve Outcome: Progressing

## 2022-05-13 NOTE — Progress Notes (Signed)
Physical Therapy Session Note  Patient Details  Name: John Ware MRN: [id number] Date of Birth: Aug 05, 1966  Today's Date: 05/13/2022 PT Individual Time: 5885-0277, 4128-7867 PT Individual Time Calculation (min): 58 min, 42 min   Short Term Goals: Week 1:  PT Short Term Goal 1 (Week 1): pt will ambulate >50 ft with LRAD PT Short Term Goal 2 (Week 1): Pt navigate 6" stair x 2 in preparation for stair navigation PT Short Term Goal 3 (Week 1): Pt will perform >20 minutes of activity before requiring rest break  Skilled Therapeutic Interventions/Progress Updates:    Session 1: pt received in bed and agreeable to therapy. Pt reports pain throughout session, unrated, premedicated. Rest and positioning provided as needed. Pt tends to hyperfixate on pain so moderating discussion for improved participation.   Supine>sit with supervision and bed features. Pt with poor adherence to back precautions.  ambulatory transfer to w/c with supervision and RW. Pt propelled w/c with BUE to gym with supervision for endurance and functional mobility.   Session focused on BLE strength for improved mobility and neuromuscular control as follows: Gait 2 x 60 ft with RW and supervision. Pt demoes mild LLE ataxia, but greatly improved since this therapist's last encounter on 9/15. Kyphotic posture that pt reports is intolerable to correct. Step to pattern, pt able to achieve step through but reports this increases his pain.   Squats with RW, 5 x 6, with supervision.  Combination Sit to stand without UE support->standing marches with RW, 4x4, pt self selecting hands on knees and bracing against table when standing (but recalled correction from previous therapist about this).  Pt then ambulated x 30 ft back to w/c in same manner as above and propelled back to room. Pt remained in w/c with all needs in reach.   Session 2: Pt seated in w/c on arrival and agreeable to therapy. Pt with report of sharp pain, 8/10  with mobility, premedicated. Rest and positioning provided as needed.   Pt then performed standing activity with CGA at hi-low table moving cones across table for improved reaching and balance with UUE and no UE support. Pt performed 4 bouts of ~2 minutes in standing, able to stand with supervision to table.   Pt also utilized nustep x 7 min at level 4 for BLE ROM and global strength. Stand pivot transfer to from nustep with supervision-CGA with fatigue and RW.   Pt propelled w/c with BUE to/from therapy gym with supervision for endurance and functional mobility. Pt returned to room and remained in w/c with all needs in reach.    Therapy Documentation Precautions:  Precautions Precautions: Back, Fall Precaution Booklet Issued: No Precaution Comments: impulsivity, motor control deficits for LLE, verbally reviewed precautions, pt recalled 3/3 Required Braces or Orthoses:  (per chart review - no brace needed per MD) Restrictions Weight Bearing Restrictions: No General:      Therapy/Group: Individual Therapy  Mickel Fuchs 05/13/2022, 8:27 AM

## 2022-05-13 NOTE — Progress Notes (Signed)
Occupational Therapy Session Note  Patient Details  Name: John Ware MRN: [id number] Date of Birth: August 29, 1965  Today's Date: 05/13/2022 OT Individual Time: 0930-1040 OT Individual Time Calculation (min): 70 min    Short Term Goals: Week 1:  OT Short Term Goal 1 (Week 1): Pt will perform toileting with CGA with use of LRAD and grab bars as needed. OT Short Term Goal 2 (Week 1): Pt will perform toilet transfer with CGA for safety with LRAD and grab bars as needed. OT Short Term Goal 3 (Week 1): Pt will complete UB/LB dressing with CGA with use of AE as needed while maintaining spinal precautions.  Skilled Therapeutic Interventions/Progress Updates:    Pt resting in w/c upon arrival. IV running so pt unable to take a shower this morning. Pt propelled w/c to day room for BUE/BLE therex on NuStep-2x5 min level 4. Pt propelled w/c to gym. Ball toss to [employer]. Ball tap with 1# bar 4x15. Rest breaks between each set. Pt reports that UE activity aggravates low back pain but pt is able to tolerate. Pt returned to room and remained in w/c with all needs within reach. Wife present.  Therapy Documentation Precautions:  Precautions Precautions: Back, Fall Precaution Booklet Issued: No Precaution Comments: impulsivity, motor control deficits for LLE, verbally reviewed precautions, pt recalled 3/3 Required Braces or Orthoses:  (per chart review - no brace needed per MD) Restrictions Weight Bearing Restrictions: No    Pain:  Pt c/o 5/10 back pain at rest and with activity; emotional support and requested pain meds from RN   Therapy/Group: Individual Therapy  Leroy Libman 05/13/2022, 12:18 PM

## 2022-05-13 NOTE — Progress Notes (Signed)
PROGRESS NOTE   Subjective/Complaints:  Pt working with therapy in the gym. Reports he understands that the methadone takes time to reach its full effect. He says pain is better now than it was a few days ago.   LBM 9/16, M     05/13/2022    6:26 AM 05/12/2022    7:55 PM 05/12/2022    2:12 PM  Vitals with BMI  Systolic 99 314 970  Diastolic 66 66 85  Pulse 66 66 77     ROS: Denies fevers, chills, N/V, abdominal pain, constipation, diarrhea, HA, SOB, cough, chest pain, new weakness or paraesthesias.    Objective:   No results found. Recent Labs    05/11/22 0421 05/13/22 0420  WBC 10.7* 9.7  HGB 8.7* 9.2*  HCT 27.5* 29.1*  PLT 477* 503*    Recent Labs    05/13/22 0420  NA 134*  K 4.2  CL 96*  CO2 28  GLUCOSE 97  BUN 9  CREATININE 0.57*  CALCIUM 9.3     Intake/Output Summary (Last 24 hours) at 05/13/2022 1255 Last data filed at 05/13/2022 0806 Gross per 24 hour  Intake 1000 ml  Output 2975 ml  Net -1975 ml         Physical Exam: Vital Signs reviewed as above.  Constitutional: No apparent distress. Appropriate appearance for age. Working with therapy in gym HENT: Atraumatic, normocephalic. Eyes: PERRL. No apparent EOM deficits.  Cardiovascular: RRR, no murmurs/rub/gallops. No Edema. Peripheral pulses 2+  Respiratory: CTAB. No rales, rhonchi, or wheezing. On RA.  Abdomen: + bowel sounds, normoactive, nondistended  Skin: C/D/I. Spinal surgical site closed, healing well - raised area under incision non-fluctuant, no erythema, no warmth. No discoloration. Stable on subsequent exams.  MSK: Moving all 4 limbs in bed  Neurologic exam:  Cognition: AAO to person, place, time and event.   PE from prior encounter: General: awake, alert, appropriate, supine in bed; NAD HENT: conjugate gaze; oropharynx moist CV: regular rate; no JVD Pulmonary: CTA B/L; no W/R/R- good air movement GI: soft, NT, ND, (+)BS-  no wounds to give another explanation of nerve pain Psychiatric: appropriate Neurological: Ox3   Musculoskeletal:     Comments: Left shoulder with atrophy and limited ROM. Biceps 3/5. LLE ataxic.  Otherwise with 5/5 strength. Skin:    Comments: Back incision C/D/I with two fluctuant areas (decreased per wife) - very puffy- no drainage- no redness- Buttocks- looks like healing heat rash Neurological:     Mental Status: He is alert and oriented to person, place, and time. Speech is fluent       Assessment/Plan: 1. Functional deficits which require 3+ hours per day of interdisciplinary therapy in a comprehensive inpatient rehab setting. Physiatrist is providing close team supervision and 24 hour management of active medical problems listed below. Physiatrist and rehab team continue to assess barriers to discharge/monitor patient progress toward functional and medical goals  Care Tool:  Bathing    Body parts bathed by patient: Right arm, Left arm, Abdomen, Chest, Front perineal area, Left upper leg, Right upper leg, Face, Left lower leg, Right lower leg, Buttocks   Body parts bathed by helper: Buttocks  Bathing assist Assist Level: Minimal Assistance - Patient > 75%     Upper Body Dressing/Undressing Upper body dressing   What is the patient wearing?: Pull over shirt    Upper body assist Assist Level: Supervision/Verbal cueing    Lower Body Dressing/Undressing Lower body dressing      What is the patient wearing?: Pants, Underwear/pull up     Lower body assist Assist for lower body dressing: Moderate Assistance - Patient 50 - 74%     Toileting Toileting    Toileting assist Assist for toileting: Independent with assistive device Assistive Device Comment: urinal   Transfers Chair/bed transfer  Transfers assist     Chair/bed transfer assist level: Minimal Assistance - Patient > 75%     Locomotion Ambulation   Ambulation assist      Assist level:  Minimal Assistance - Patient > 75% Assistive device: Walker-rolling Max distance: 30 ft   Walk 10 feet activity   Assist     Assist level: Minimal Assistance - Patient > 75% Assistive device: Walker-rolling   Walk 50 feet activity   Assist Walk 50 feet with 2 turns activity did not occur: Safety/medical concerns         Walk 150 feet activity   Assist Walk 150 feet activity did not occur: Safety/medical concerns         Walk 10 feet on uneven surface  activity   Assist Walk 10 feet on uneven surfaces activity did not occur: Safety/medical concerns         Wheelchair     Assist Is the patient using a wheelchair?: Yes Type of Wheelchair: Manual    Wheelchair assist level: Set up assist Max wheelchair distance: 250 ft    Wheelchair 50 feet with 2 turns activity    Assist        Assist Level: Supervision/Verbal cueing   Wheelchair 150 feet activity     Assist      Assist Level: Supervision/Verbal cueing   Blood pressure 99/66, pulse 66, temperature 98.3 F (36.8 C), temperature source Oral, resp. rate 16, height 5\' 10"  (1.778 m), weight 71.7 kg, SpO2 97 %.  Assessment and plan: Medical Problem List and Plan: 1. 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  Antithrombotics: -DVT/anticoagulation:  Mechanical: Sequential compression devices, below knee Bilateral lower extremities             -antiplatelet therapy: N/A 3. Pain: continue prn oxycodone.              9/14- will change ms contin to methadone 10 mg TID- if cannot come to a decent improvement in pain, will need to switch back- if doesn't work, switch back to MS Contin but do 30 mg TID, instead of 45 mg BID.              9/15- pt reports pain still really bothering him- in at level SCI pain- at T10/11- but  explained MS Contin won't help as much. Methadone will change in 4 days to 15 mg BID- can change back if need be, but would prefer BID since methadone increases half life over 7 days.  9/17 - Patient refusing therapies d/t poor pain control. Note methadone increase pending in AM; will hold off on further medication adjustments  9/18 Methadone up to 15mg  BID, continue to monitor response   4. Mood/Behavior/Sleep: LCSW to follow for evaluation and support.              -antipsychotic agents: N/A 5. Neuropsych/cognition: This patient is capable of making decisions on his own behalf. 6. Skin/Wound Care: Monitor back incision for healing.     --Routine pressure relief measures.  - 9/16 - appears stable. Per neurosurgery no acute intervention, likely resolve with time.  CBC stable  7. Fluids/Electrolytes/Nutrition: Monitor I/O. Wife to bring food from home as does not like food her.  --Check CMET in am.  8. MRSA bacteremia: Continue Cefepime for 6 weeks w/end date 10/             --lifelong suppression w/doxycycline and cipro?  9. T2DM: Hgb A1c--7.1. Will monitor BS ac/hs- has libre and does not want finger stuck-.  Did get couple of doses of steroids with BS labile. --continue Levemir and SSI has been refused by patient-->people die in -hospital. -=He was able to get A1C down to 7 by diet and plans to continue that. ---will continue to educate on importance of BS control.              9/14- BG control decent- con't regimen and monitoring 9/18 glucose 97 on BMP, indicating good control 10. Leucocytosis: WBC has been ranging from 10-11 range. Repeat WBC tomorrow             9/14- WBC 13k- hasn't been checked in 11 days- will recheck again in am to see if going up or down.              9/15- WBC down to 11.6k- so will con't to monitor weekly.   9/18 WBC down to 9.7 11. Constipation?: No BM documented  --per patient bowels have been working and feels that NO laxative needed.  \              9/14- says LBM last night- refuses bowel meds.              9/18 LBM 9/16 12. Itching             9/14- asked for atarax to be scheduled q8 hours.  13. At level SCI pain             9/15- start Cymbalta 30 mg QHS x 5 days then 60 mg QHS.              9/16, 9/17 - stable 14. Diastolic HF             [id number] - received notification from nursing that family was reviewing an informaiton packet in the patient's room and was upset to find literature on managing heart failure. Family was not aware patient carried this diagnosis. On chart review, was mentioned on admission that patient had "chronic diastolic heart failure" based on an echo from 05/2021, which showed grade 1 dysfunction. Printed these results and discussed with patient, recommended PCP f/u at DC as he remains asymptomatic.   LOS: 5 days A FACE TO FACE EVALUATION WAS PERFORMED  Jennye Boroughs 05/13/2022, 12:55 PM    **addendum** r

## 2022-05-14 DIAGNOSIS — D649 Anemia, unspecified: Secondary | ICD-10-CM

## 2022-05-14 DIAGNOSIS — M792 Neuralgia and neuritis, unspecified: Secondary | ICD-10-CM

## 2022-05-14 DIAGNOSIS — Z8659 Personal history of other mental and behavioral disorders: Secondary | ICD-10-CM

## 2022-05-14 NOTE — Progress Notes (Signed)
Patient ID: John Ware, male   DOB: [birth year], 56 y.o.   MRN: [id number]  SW met with pt and pt wife in room to provide updates from team conference, and d/c date 9/22. Pt is excited about d/c early. Pt aware informed [facility] will be working on IV abx. SW will confirm HHA once in place. Fam edu scheduled for Thursday (9/21) 1pm-3pm.   [facility] accepted referral for HHPT/OT/SN.   Loralee Pacas, MSW, [place] Office: [phone] Cell: [phone] Fax: [phone]

## 2022-05-14 NOTE — Patient Care Conference (Signed)
Inpatient RehabilitationTeam Conference and Plan of Care Update Date: 05/14/2022   Time: 11:19 AM    Patient Name: John Ware      Medical Record Number: [id number]  Date of Birth: June 11, 1966 Sex: Male         Room/Bed: 4W09C/4W09C-01 Payor Info: Payor: Advertising copywriter / Plan: [employer] PPO / Product Type: *No Product type* /    Admit Date/Time:  05/08/2022  5:10 PM  Primary Diagnosis:  Osteomyelitis of lumbar spine [facility])  Hospital Problems: Principal Problem:   Osteomyelitis of lumbar spine [facility])    Expected Discharge Date: Expected Discharge Date: 05/17/22  Team Members Present: Physician leading conference: Dr. Fanny Dance Social Worker Present: Cecile Sheerer, LCSWA Nurse Present: Kennyth Arnold, RN PT Present: Bernie Covey, PT OT Present: Ardis Rowan, COTA;Jennifer Katrinka Blazing, OT     Current Status/Progress Goal Weekly Team Focus  Bowel/Bladder   continent b/b, lbm 9/16  remain continent  toilet as needed   Swallow/Nutrition/ Hydration             ADL's   tranfsers with RW-supervision; LB bathing/dressing-min A; toileting-min A;  supervision overall  BADL retraining; activity tolerance, safety awareness   Mobility   supervision bed mobility and transfers, CGA gait and 1 stair  upgraded to mod I  pain management, global strength   Communication             Safety/Cognition/ Behavioral Observations            Pain   10/10 back pain  < 4  assess pain q 4 hr and prn   Skin   Hematoma left side, incision to lower back, no drainage  no new breakdown  assess skin q shift and prn     Discharge Planning:  D/c to home with his wife who works from home. Pt will remain on IV abx until 10/19. [facility] following.   Team Discussion: Osteomyelitis of lumbar spine. PICC line in place for IV abx until 10/19. Manages DM with Josephine Igo. Continent B/B, LBM 9/16. Reports 10/10 back pain. Hematoma left side of incision to lower back. No drainage  noted. Request Home Health vs Outpatient therapy. Discharging home with spouse.  Patient on target to meet rehab goals: yes, supervision goals, PT upgrading goals to Mod I. Currently spouse assist with ADL's.  *See Care Plan and progress notes for long and short-term goals.   Revisions to Treatment Plan:  Therapy upgrading goals.   Teaching Needs: Family education, medication/pain management, skin/wound care, transfer/gait training, etc.   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 , pain, constipation, leukocytosis Possible Resolutions to [employer] Focus: medications for pain, monitor bowel function, check CBC   Continued Need for Acute Rehabilitation Level of Care: The patient requires daily medical management by a physician with specialized training in physical medicine and rehabilitation for the following reasons: Direction of a multidisciplinary physical rehabilitation program to maximize functional independence : Yes Medical management of patient stability for increased activity during participation in an intensive rehabilitation regime.: Yes Analysis of laboratory values and/or radiology reports with any subsequent need for medication adjustment and/or medical intervention. : Yes   I  attest that I was present, lead the team conference, and concur with the assessment and plan of the team.   Cristi Loron 05/14/2022, 2:27 PM

## 2022-05-14 NOTE — Progress Notes (Signed)
Occupational Therapy Session Note  Patient Details  Name: John Ware MRN: [id number] Date of Birth: 03/08/1966  Today's Date: 05/14/2022 OT Individual Time: 1000-1054 OT Individual Time Calculation (min): 54 min    Short Term Goals: Week 1:  OT Short Term Goal 1 (Week 1): Pt will perform toileting with CGA with use of LRAD and grab bars as needed. OT Short Term Goal 2 (Week 1): Pt will perform toilet transfer with CGA for safety with LRAD and grab bars as needed. OT Short Term Goal 3 (Week 1): Pt will complete UB/LB dressing with CGA with use of AE as needed while maintaining spinal precautions.  Skilled Therapeutic Interventions/Progress Updates:    Pt resting in w/c with wife present. OT intervention with focus on bathing at shower level, dressing with sit<>stand, w/c mobility, functional amb with RW, and BUE activities to increase independence with BADLs. Pt elected to use w/c to access bathroom for shower. Shower transfer with CGA. Bathing with CGA. LB dressing with CGA. Pt propelled w/c into hallway and to nursing station. Pt transitioned to ortho gym-10 min  level 4 [employer]. Pt propelled w/c to elevators. Pt returned to room and remained in w/c with all needs within reach.   Therapy Documentation Precautions:  Precautions Precautions: Back, Fall Precaution Booklet Issued: No Precaution Comments: impulsivity, motor control deficits for LLE, verbally reviewed precautions, pt recalled 3/3 Required Braces or Orthoses:  (per chart review - no brace needed per MD) Restrictions Weight Bearing Restrictions: No   Pain: Pt reports 5/10 low back pain  Therapy/Group: Individual Therapy  Leroy Libman 05/14/2022, 12:06 PM

## 2022-05-14 NOTE — Consult Note (Signed)
Neuropsychological Consultation   Patient:   John Ware   DOB:   [birth year]  MR Number:  [id number]  Location:  [facility] [id number] [place] [address] Dept: [place]: [phone]           Date of Service:   05/14/2022  Start Time:   2 PM End Time:   3 PM  Provider/Observer:  Ilean Skill, Psy.D.       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  Patient was seen last year at this time on the inpatient rehab unit where I saw the patient 1 time during that visit.  During that visit he was dealing with depressive response to his challenging medical status with significant infection.  Patient had been followed by Dr. Ellene Route.  Patient is on long-term doxycycline antibiotic.  Patient was admitted on 04/22/2022 with inability to walk, bilateral lower extremity weakness, intermittent fevers and urinary incontinence for 3 weeks.  Patient was found to again have bacterial hemo with acute discitis T9/10 T11/T12 with epidural abscess, septic arthritis and extensive enhancement of paraspinal soft tissue.  There was extensive enhancement of soft tissue T2-T7, obliteration of disc space T4 with probable tissue abscess.  Patient again was started on IV antibiotics and IR consulted for aspiration following recommendations from Dr. Ellene Route.  Patient taken the OR for I&D of osteomyelitis via T10-T11 laminectomy.  Patient recommendations for 6 weeks IV antibiotic and continue with oral doxycycline indefinitely.  Patient is continue to be limited by pain and weakness and has again returned to CIR due to functional decline.  Reason for Service:  Patient  referred for neuropsychological consultation due to stable hospital stay and history of depression and coping issues with extended hospital stays.  Below is the HPI for the current admission.  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  He was admitted on 04/22/22 with inability to walk, BLE numbness, intermittent fevers  and urinary incontinence X 3 weeks. He was found to have  bacteremia with acute discitis T9/10- T11/T12 with epidural phlegmon/abscess, septic arthritis T11-T12, extensive enhancement paraspinal soft tissue, extensive enhancement soft tissue T2-T7, obliteration of disc space w/retrolisthesis-T4,  with probable soft tissue abscess 2.4 X 1.1 X 2.9 cm soft tissue abscess. He was started on IV antibiotics by Dr. Tommy Medal and IR consulted for aspiration per Dr. Clarice Pole input. He was taken to OR on 09/07 for I and D of SQ abscess decompression and debridement of osteomyelitis via T10-T11 laminectomy.    Wound culture positive pseudomonas and blood cultures positive for MRSA/staph epidermidis. ID recommends 6 weeks IV cefepime w/end date 10/19 and oral doxycycline indefinitely for hardware associated staph infection. He continues to be limited by pain as well as weakness Left > Right with ataxia as well sensory deficits affecting mobility and ADLs. CIR recommended due to functional decline.   Current Status:  Patient was awake and alert sitting up in his wheelchair.  He remembered our visit from the  year prior and appeared to be displaying good mental status and cognition.  Memory, expressive language and comprehension were all within normal limits.  Patient acknowledged frustration appropriate to situation and denied any exacerbation this time of any worsening of depressive  symptoms and denies significant depression at this time.  Behavioral Observation: John Ware  presents as a 56 y.o.-year-old Right handed  Caucasian Male who appeared his stated age. his dress was Appropriate and he was Well Groomed and his manners were Appropriate to the situation.  his participation was indicative of Appropriate and Attentive behaviors.  There were physical disabilities noted.  he displayed an appropriate level of cooperation and motivation.     Interactions:    Active Appropriate  Attention:   within normal limits and attention span and concentration were age appropriate  Memory:   within normal limits; recent and remote memory intact  Visuo-spatial:  not examined  Speech (Volume):  normal  Speech:   normal; normal  Thought Process:  Coherent and Relevant  Though Content:  WNL; not suicidal and not homicidal  Orientation:   person, place, time/date, and situation  Judgment:   Good  Planning:   Fair  Affect:    Appropriate  Mood:    Dysphoric  Insight:   Good  Intelligence:   normal   Medical History:   Past Medical History:  Diagnosis Date   Depression    Diabetes mellitus without complication (HCC)    Hypertension    Spinal stenosis    With Neurogenic Claudication         Patient Active Problem List   Diagnosis Date Noted   History of depression    Osteomyelitis of lumbar spine (HCC) 05/08/2022   Midline low back pain    Numbness    Osteomyelitis (HCC)    Chronic diastolic CHF (congestive heart failure) (HCC) 04/23/2022   Chronic pain 04/23/2022   Osteomyelitis of vertebra of thoracolumbar region (HCC) 04/22/2022   Presence of retained hardware 10/17/2021   Infective myositis 08/29/2021   Discitis of thoracolumbar region 07/31/2021   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   MRSA infection    Diabetic peripheral neuropathy (HCC)    Septic discitis of lumbar region 06/11/2021   Infection of spine (HCC) 05/28/2021   Medication monitoring  encounter 05/09/2021   MSSA (methicillin susceptible Staphylococcus aureus) infection 05/09/2021   History of COVID-19    Psoas abscess (HCC)    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   Right shoulder pain 02/14/2021   Chest wall abscess 02/14/2021   Normocytic anemia 02/14/2021   Thrombocytosis 02/14/2021   Endocarditis 02/05/2021   Vision changes 02/04/2021   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   Pressure injury of skin 02/01/2021   HTN (hypertension) 02/01/2021   Anxiety 02/01/2021   Left shoulder pain 02/01/2021   Hyponatremia 01/31/2021   Positive colorectal cancer screening using Cologuard test 12/28/2020   DOE (dyspnea on exertion) 10/20/2020   Diabetic polyneuropathy associated with type 2 diabetes mellitus (HCC) 10/09/2020   DM2 (diabetes mellitus, type 2) (HCC) 10/30/2018   Lumbar stenosis with neurogenic claudication 10/14/2017   DDD (degenerative disc disease), lumbar 02/17/2017   Transient weakness of right lower extremity 02/05/2017   Hyperlipidemia associated with type 2 diabetes mellitus (HCC) 10/24/2016   Depression, recurrent (HCC) 10/24/2016   Right knee pain 05/21/2016   DDD (degenerative disc disease), lumbosacral 05/21/2016   Herniation of nucleus pulposus of cervical intervertebral disc without myelopathy 02/29/2016   Carpal tunnel syndrome 02/08/2016  Cervical spondylosis with myelopathy 01/17/2016   Lumbar stenosis 12/13/2014    Psychiatric History:  Prior history of depressive disorder  Family Med/Psych History:  Family History  Problem Relation Age of Onset   Cancer Mother    Colon cancer Neg Hx     Risk of Suicide/Violence: virtually non-existent   Impression/DX:  HADES MATHEW is a 56 year old male with a past medical history including type 2 diabetes, 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  Patient was seen last year at this time on the inpatient rehab unit  where I saw the patient 1 time during that visit.  During that visit he was dealing with depressive response to his challenging medical status with significant infection.  Patient had been followed by [name].  Patient is on long-term doxycycline antibiotic.  Patient was admitted on 04/22/2022 with inability to walk, bilateral lower extremity weakness, intermittent fevers and urinary incontinence for 3 weeks.  Patient was found to again have bacterial hemo with acute discitis T9/10 T11/T12 with epidural abscess, septic arthritis and extensive enhancement of paraspinal soft tissue.  There was extensive enhancement of soft tissue T2-T7, obliteration of disc space T4 with probable tissue abscess.  Patient again was started on IV antibiotics and IR consulted for aspiration following recommendations from [name].  Patient taken the OR for I&D of osteomyelitis via T10-T11 laminectomy.  Patient recommendations for 6 weeks IV antibiotic and continue with oral doxycycline indefinitely.  Patient is continue to be limited by pain and weakness and has again returned to CIR due to functional decline.  Patient was awake and alert sitting up in his wheelchair.  He remembered our visit from the year prior and appeared to be displaying good mental status and cognition.  Memory, expressive language and comprehension were all within normal limits.  Patient acknowledged frustration appropriate to situation and denied any exacerbation this time of any worsening of depressive symptoms and denies significant depression at this time.  Disposition/Plan:  Worked on coping and adjustment issues with history of depression.  Patient denies exacerbation of depressive symptoms and that he is coping better with this hospitalization than he did with the one 1 year prior.  Diagnosis:    Osteomyelitis of lumbar spine (HCC) - Plan: Ambulatory referral to Physical Medicine Rehab         Electronically  Signed   _______________________ Arley Phenix, Psy.D. Clinical Neuropsychologist

## 2022-05-14 NOTE — Progress Notes (Signed)
Physical Therapy Session Note  Patient Details  Name: John Ware MRN: [id number] Date of Birth: 05-27-1966  Today's Date: 05/14/2022 PT Individual Time: 0800-0910, 1300-1406 PT Individual Time Calculation (min): 70 min   Short Term Goals: Week 1:  PT Short Term Goal 1 (Week 1): pt will ambulate >50 ft with LRAD PT Short Term Goal 2 (Week 1): Pt navigate 6" stair x 2 in preparation for stair navigation PT Short Term Goal 3 (Week 1): Pt will perform >20 minutes of activity before requiring rest break  Skilled Therapeutic Interventions/Progress Updates:    Session 1: Pt seated in w/c on arrival and agreeable to therapy. Pt reports continued back pain, premedicated. Rest and positioning provided as needed. Pt propelled w/c with BUE throughout session with supervision. Supervision for w/c parts management. Pt performed Stand pivot transfer to nustep with supervision and RW. Noted kyphotic posture but reports he cannot correct this. Pt utilized nustep x 15 minutes at level 4 for global strength and endurance. Pt received pain medication from nsg during session. Pt reports that his blood sugar has been lower d/t increased exercise. Discussed methods for continuing to maintain increased activity level at d/c. Transitioned to kinetron at 30 cm/sec 4 x 3 min with 2 min rest breaks d/t pain. Pt then participated in seated corn hole with another patient for UE endurance and psychosocial benefit of social interaction. Pt reached across body with RUE for balance and threw with LUE. Pt returned to room and remained in w/c, was left with all needs in reach.   Session 2: pt received in w/c and agreeable to therapy. Pt reports 5/10 pain with mobility, premedicated. Rest and positioning provided as needed. Pt propelled w/c with BUE to therapy gym with supervision. Pt performed step taps 4 x 5 BIL with UE support on rails and CGA. Required guarding at L knee for confidence and stability. Pt has tendency toward  hyperextending LLE, discussed improving stability and confidence with L knee. Pt then utilized rebounder in standing. Pt able to toss ball up to 10 tosses but loses balance posteriorly. Discussed weight distribution and attempted to shift weight toward toes but pt with difficulty d/t ataxia/decr motor planning. Transitioned to // bars and performed anterior/posterior weight shifting with multimodal cueing and as much as min A for slight LOB. Pt with improved technique on second attempt. Pt then ambulated x >150 ft back to room with supervision and w/c follow for fatigue/pain. Pt remained seated in w/c at end of session with all needs in reach. Pt reports pain and fatigue and cannot complete any more activities, missed 9 minutes of therapy d/t pain and fatigue.   Therapy Documentation Precautions:  Precautions Precautions: Back, Fall Precaution Booklet Issued: No Precaution Comments: impulsivity, motor control deficits for LLE, verbally reviewed precautions, pt recalled 3/3 Required Braces or Orthoses:  (per chart review - no brace needed per MD) Restrictions Weight Bearing Restrictions: No General: PT Amount of Missed Time (min): 9 Minutes PT Missed Treatment Reason: Pain;Patient fatigue     Therapy/Group: Individual Therapy  Mickel Fuchs 05/14/2022, 8:08 AM

## 2022-05-14 NOTE — Progress Notes (Signed)
PROGRESS NOTE   Subjective/Complaints:  Patient in bedside chair this AM. Report current medications helping his pain although he understands that methadone takes several days to take full affect. He continues to have chronic shoulder pain. He reports prn percocet helping today and he says he is getting it more regularly today than yesterday.   LBM 9/16     05/14/2022    6:14 AM 05/13/2022    7:46 PM 05/13/2022    3:16 PM  Vitals with BMI  Systolic [id number]  Diastolic 87 81 71  Pulse 81 76 77     ROS: Denies fevers, chills, N/V, abdominal pain, constipation, diarrhea, HA, SOB, cough, chest pain  Objective:   No results found. Recent Labs    05/13/22 0420  WBC 9.7  HGB 9.2*  HCT 29.1*  PLT 503*    Recent Labs    05/13/22 0420  NA 134*  K 4.2  CL 96*  CO2 28  GLUCOSE 97  BUN 9  CREATININE 0.57*  CALCIUM 9.3     Intake/Output Summary (Last 24 hours) at 05/14/2022 1120 Last data filed at 05/13/2022 1843 Gross per 24 hour  Intake 240 ml  Output 300 ml  Net -60 ml         Physical Exam: Vital Signs reviewed as above.    PE from prior encounter: General: awake, alert, appropriate, supine in bed; NAD HENT: conjugate gaze; oropharynx moist CV: regular rate; no JVD Pulmonary: CTA B/L; no W/R/R- good air movement GI: soft, NT, ND, (+)BS- no wounds to give another explanation of nerve pain Psychiatric: appropriate Neurological: Ox3   Musculoskeletal:     Comments: Left shoulder with atrophy and limited ROM. Biceps 3/5. LLE ataxic.  Otherwise with 5/5 strength. Skin:    Comments: Back incision C/D/I with two fluctuant areas no drainage- no redness- appears slightly improved from prior  Buttocks- looks like healing heat rash Neurological:     Mental Status: He is alert and oriented to person, place, and time. Speech is fluent        Assessment/Plan: 1. Functional deficits which require 3+  hours per day of interdisciplinary therapy in a comprehensive inpatient rehab setting. Physiatrist is providing close team supervision and 24 hour management of active medical problems listed below. Physiatrist and rehab team continue to assess barriers to discharge/monitor patient progress toward functional and medical goals  Care Tool:  Bathing    Body parts bathed by patient: Right arm, Left arm, Abdomen, Chest, Front perineal area, Left upper leg, Right upper leg, Face, Left lower leg, Right lower leg, Buttocks   Body parts bathed by helper: Buttocks     Bathing assist Assist Level: Minimal Assistance - Patient > 75%     Upper Body Dressing/Undressing Upper body dressing   What is the patient wearing?: Pull over shirt    Upper body assist Assist Level: Supervision/Verbal cueing    Lower Body Dressing/Undressing Lower body dressing      What is the patient wearing?: Pants, Underwear/pull up     Lower body assist Assist for lower body dressing: Moderate Assistance - Patient 50 - 74%     Toileting Toileting  Toileting assist Assist for toileting: Independent with assistive device Assistive Device Comment: urinal   Transfers Chair/bed transfer  Transfers assist     Chair/bed transfer assist level: Minimal Assistance - Patient > 75%     Locomotion Ambulation   Ambulation assist      Assist level: Minimal Assistance - Patient > 75% Assistive device: Walker-rolling Max distance: 30 ft   Walk 10 feet activity   Assist     Assist level: Minimal Assistance - Patient > 75% Assistive device: Walker-rolling   Walk 50 feet activity   Assist Walk 50 feet with 2 turns activity did not occur: Safety/medical concerns         Walk 150 feet activity   Assist Walk 150 feet activity did not occur: Safety/medical concerns         Walk 10 feet on uneven surface  activity   Assist Walk 10 feet on uneven surfaces activity did not occur:  Safety/medical concerns         Wheelchair     Assist Is the patient using a wheelchair?: Yes Type of Wheelchair: Manual    Wheelchair assist level: Set up assist Max wheelchair distance: 250 ft    Wheelchair 50 feet with 2 turns activity    Assist        Assist Level: Supervision/Verbal cueing   Wheelchair 150 feet activity     Assist      Assist Level: Supervision/Verbal cueing   Blood pressure 124/87, pulse 81, temperature 97.9 F (36.6 C), resp. rate 16, height 5\' 10"  (1.778 m), weight 71.7 kg, SpO2 93 %.  Assessment and plan: Medical Problem List and Plan: 1. Functional deficits secondary to osteomyelitis of lumbar spine s/p thoracic laminectomy             -patient may shower             -ELOS/Goals: 10-14 days modI            IPOC today Con't CIR- Con't PT and OT -Team conference today 2.  Antithrombotics: -DVT/anticoagulation:  Mechanical: Sequential compression devices, below knee Bilateral lower extremities             -antiplatelet therapy: N/A 3. Pain: continue prn oxycodone.              9/14- will change ms contin to methadone 10 mg TID- if cannot come to a decent improvement in pain, will need to switch back- if doesn't work, switch back to MS Contin but do 30 mg TID, instead of 45 mg BID.              9/15- pt reports pain still really bothering him- in at level SCI pain- at T10/11- but explained MS Contin won't help as much. Methadone will change in 4 days to 15 mg BID- can change back if need be, but would prefer BID since methadone increases half life over 7 days.               9/17 - Patient refusing therapies d/t poor pain control. Note methadone increase pending in AM; will hold off on further medication adjustments  9/18 Methadone up to 15mg  BID, continue to monitor response  9/19 pain control appears to be improving gradually   4. Mood/Behavior/Sleep: LCSW to follow for evaluation and support.              -antipsychotic agents:  N/A 5. Neuropsych/cognition: This patient is capable of making decisions on his own behalf.  6. Skin/Wound Care: Monitor back incision for healing.     --Routine pressure relief measures.  - 9/16 - appears stable. Per neurosurgery no acute intervention, likely resolve with time.  CBC stable  7. Fluids/Electrolytes/Nutrition: Monitor I/O. Wife to bring food from home as does not like food her.  --Check CMET in am.  8. MRSA bacteremia: Continue Cefepime for 6 weeks w/end date 10/             --lifelong suppression w/doxycycline and cipro?  9. T2DM: Hgb A1c--7.1. Will monitor BS ac/hs- has libre and does not want finger stuck-.  Did get couple of doses of steroids with BS labile. --continue Levemir and SSI has been refused by patient-->people die in -hospital. -=He was able to get A1C down to 7 by diet and plans to continue that. ---will continue to educate on importance of BS control.              9/14- BG control decent- con't regimen and monitoring 9/18 glucose 97 on BMP, indicating good control 10. Leucocytosis: WBC has been ranging from 10-11 range. Repeat WBC tomorrow             9/14- WBC 13k- hasn't been checked in 11 days- will recheck again in am to see if going up or down.              9/15- WBC down to 11.6k- so will con't to monitor weekly.   9/18 WBC down to 9.7 11. Constipation?: No BM documented  --per patient bowels have been working and feels that NO laxative needed.  \             9/14- says LBM last night- refuses bowel meds.              9/19 LBM 9/16, consider laxatives tomorrow if no BM today  12. Itching             9/14- asked for atarax to be scheduled q8 hours.  13. At level SCI pain             9/15- start Cymbalta 30 mg QHS x 5 days then 60 mg QHS.              9/16, 9/17 - stable  9/19 consider increasing Cymbalta dose tomorrow 14. Diastolic HF             [id number] - received notification from nursing that family was reviewing an informaiton packet in the  patient's room and was upset to find literature on managing heart failure. Family was not aware patient carried this diagnosis. On chart review, was mentioned on admission that patient had "chronic diastolic heart failure" based on an echo from 05/2021, which showed grade 1 dysfunction. Printed these results and discussed with patient, recommended PCP f/u at DC as he remains asymptomatic.  15. Anemia.  -HGB improved to 9.2 on 9/18, follow  LOS: 6 days A FACE TO FACE EVALUATION WAS PERFORMED  Jennye Boroughs 05/14/2022, 11:20 AM    **addendum** r

## 2022-05-15 MED ORDER — ORAL CARE MOUTH RINSE: 15.0000 mL | OROMUCOSAL | Status: DC | PRN

## 2022-05-15 MED ORDER — CEFEPIME IV (FOR PTA / DISCHARGE USE ONLY): 2.0000 g | Freq: Three times a day (TID) | INTRAVENOUS | 0 refills | Status: AC

## 2022-05-15 MED ORDER — SORBITOL 70 % SOLN: 60.0000 mL | Freq: Once | Status: DC

## 2022-05-15 NOTE — Progress Notes (Signed)
Occupational Therapy Session Note  Patient Details  Name: John Ware MRN: [id number] Date of Birth: 09-Jun-1966  Today's Date: 05/15/2022 OT Individual Time: 6295-2841 OT Individual Time Calculation (min): 70 min    Short Term Goals: Week 1:  OT Short Term Goal 1 (Week 1): Pt will perform toileting with CGA with use of LRAD and grab bars as needed. OT Short Term Goal 2 (Week 1): Pt will perform toilet transfer with CGA for safety with LRAD and grab bars as needed. OT Short Term Goal 3 (Week 1): Pt will complete UB/LB dressing with CGA with use of AE as needed while maintaining spinal precautions.  Skilled Therapeutic Interventions/Progress Updates:    OT intervention with focus on w/c mobility, BUE therex, functional amb in community setting, activity tolerance, and safety awareness to increase independence and prepare for discharge home on 9/22. Pt propelled w/c to elevators and transported (time mgmt) to gym. Ball tosses againes rebounder 3x20. Pt transitioned to [facility] and propelled w/c in community setting. Pt amb with RW in community setting on uneven surface 30'x2. All amb with CGA. Pt returned to gym and completed 10 mins level 3.5 on Scifit. Pt tapped small beach ball with 1# bar 3x20. Pt returned to room and remained in w/c with wife present.   Therapy Documentation Precautions:  Precautions Precautions: Back, Fall Precaution Booklet Issued: No Precaution Comments: impulsivity, motor control deficits for LLE, verbally reviewed precautions, pt recalled 3/3 Required Braces or Orthoses:  (per chart review - no brace needed per MD) Restrictions Weight Bearing Restrictions: No General:   Vital Signs:  Pain: Pt reports his pain is "not too bad"  Therapy/Group: Individual Therapy  Leroy Libman 05/15/2022, 11:28 AM

## 2022-05-15 NOTE — Progress Notes (Signed)
PHARMACY CONSULT NOTE FOR:  OUTPATIENT  PARENTERAL ANTIBIOTIC THERAPY (OPAT)  Indication: Osteomyelitis Regimen: Cefepime 2g IV q8 hours End date: 06/13/22  IV antibiotic discharge orders are pended. To discharging provider:  please sign these orders via discharge navigator,  Select New Orders & click on the button choice - Manage This Unsigned Work.     Thank you for allowing pharmacy to be a part of this patient's care.  Dimple Nanas, PharmD, BCPS 05/15/2022 12:14 PM

## 2022-05-15 NOTE — Progress Notes (Signed)
Physical Therapy Session Note  Patient Details  Name: John Ware MRN: [id number] Date of Birth: 1965/11/29  Today's Date: 05/15/2022 PT Individual Time: 0805-0910 PT Individual Time Calculation (min): 65 min   Short Term Goals: Week 1:  PT Short Term Goal 1 (Week 1): pt will ambulate >50 ft with LRAD PT Short Term Goal 2 (Week 1): Pt navigate 6" stair x 2 in preparation for stair navigation PT Short Term Goal 3 (Week 1): Pt will perform >20 minutes of activity before requiring rest break  Skilled Therapeutic Interventions/Progress Updates: Tx1: Pt presented in bed agreeable to therapy. Pt states pain controlled at moment 3-4/10. Rest breaks and distraction provided as needed. Pt performed bed mobility with supervision. IV team arrived for disconnection of PICC line then RN arrived for am meds. Pt then performed ambulatory transfer to w/c with RW. Pt noted to have improved LLE control on this date as compared to previous time with this therapist. Pt then propelled to day room and participated in ambulation in room ~176f including weaving through cones. Pt then ambulated to NuStep and participated in L5 x 15 min for general conditioning. Pt noted to maintain 50-60SPM and did not require any rest breaks. Pt did indicate mild increase in pain after activity but did not rate. Pt transferred back to w/c in same manner as prior and propelled self back to room at end of session. In room performed ambulatory transfer back to bed with RW and CGA. Pt left seated EOB per pt request with call bell within reach and needs met.   Tx2: Pt presented in w/c agreeable to therapy. Pt states pain more controlled at moment. Pt agreeable to participate in ambulation outside for integration into community. Pt propelled to 4MW elevators for general conditioning with x 1 brief rest break for delay. [facility]. Pt then participated in ambulation distances of  1114fand 9561fith CGA. Pt demonstrated fair safety with RW on uneven surfaces and over cracks. Pt noted to have decreased BOS with fatigue but did not demonstrate any knee instability nor LOB. Pt did require increased rest time between bouts due to increased pain and fatigue but improved with rest. Pt instructed with fatigue to work on increasing BOS with LLE and step towards L RW wheel. After rest pt [facility] and through [facility]. PTA transported w/c back to rehab gym from elevators. Pt then participated in NMR/coordination activities including toe taps to target on 4in step as well as toe taps on level ground incorporating crossing midline. Pt then transported to parallel bars and participated in use of rocker for a-p ankle mobility. Pt propelled back to room supervision and requested to remain in w/c. Pt left in w/c at end of session with call bell within reach and needs met.        Therapy Documentation Precautions:  Precautions Precautions: Back, Fall Precaution Booklet Issued: No Precaution Comments: impulsivity, motor control deficits for LLE, verbally reviewed precautions, pt recalled 3/3 Required Braces or Orthoses:  (per chart review - no brace needed per MD) Restrictions Weight Bearing Restrictions: No General:   Vital Signs:  Pain: Pain Assessment Pain Scale: 0-10 Pain Score: 7  Pain Type: Acute pain Pain Location: Back Pain Orientation: Lower;Medial Pain Descriptors / Indicators: Aching Pain Frequency: Intermittent Pain Onset: On-going Patients Stated Pain Goal: 4 Pain Intervention(s): Medication (See eMAR) Mobility:   Locomotion :    Trunk/Postural Assessment :  Balance:   Exercises:   Other Treatments:      Therapy/Group: Individual Therapy    05/15/2022, 12:50 PM  

## 2022-05-15 NOTE — Progress Notes (Addendum)
Patient ID: John Ware, male   DOB: September 25, 1965, 56 y.o.   MRN: [id number]  SW received updates from [facility] stating IV abx will be delivered to the home tomorrow evening. Pt will administer his second dose (2pm) on own. SOC with Alvis Lemmings will be on Monday. She will come by and visit pt and pt wife during family edu tomorrow.   SW called pt wife [name] to update on above.   Loralee Pacas, MSW, [place] Office: [phone] Cell: [phone] Fax: [phone]

## 2022-05-15 NOTE — Progress Notes (Signed)
PROGRESS NOTE   Subjective/Complaints:  Reports pain is a little improved today. He would like to be on a regular diet saying " I cant eat this food"  LBM 9/16     05/15/2022    5:13 AM 05/14/2022   12:44 PM 05/14/2022    6:14 AM  Vitals with BMI  Systolic [id number]  Diastolic 73 79 87  Pulse 57 73 81     ROS: Denies fevers, chills, N/V, abdominal pain, constipation, diarrhea, HA, SOB, cough, chest pain  Objective:   No results found. Recent Labs    05/13/22 0420  WBC 9.7  HGB 9.2*  HCT 29.1*  PLT 503*    Recent Labs    05/13/22 0420  NA 134*  K 4.2  CL 96*  CO2 28  GLUCOSE 97  BUN 9  CREATININE 0.57*  CALCIUM 9.3     Intake/Output Summary (Last 24 hours) at 05/15/2022 1125 Last data filed at 05/15/2022 0845 Gross per 24 hour  Intake 1480.67 ml  Output 200 ml  Net 1280.67 ml         Physical Exam: Vital Signs reviewed as above.    PE from prior encounter: General: awake, alert, appropriate, supine in bed; NAD HENT: conjugate gaze; oropharynx moist CV: regular rate; no JVD Pulmonary: CTA B/L; no W/R/R- good air movement GI: soft, NT, ND, (+)BS- no wounds to give another explanation of nerve pain Psychiatric: appropriate Neurological: Ox3   Musculoskeletal:     Comments: Left shoulder with atrophy and limited ROM. Biceps 3/5. LLE ataxic.  Otherwise with 5/5 strength. Insulin monitoring device on LUE Skin:    Comments: Back incision C/D/I with two fluctuant areas no drainage- no redness- appears slightly improved from prior  Buttocks- looks like healing heat rash Neurological:     Mental Status: He is alert and oriented to person, place, and time. Speech is fluent        Assessment/Plan: 1. Functional deficits which require 3+ hours per day of interdisciplinary therapy in a comprehensive inpatient rehab setting. Physiatrist is providing close team supervision and 24 hour  management of active medical problems listed below. Physiatrist and rehab team continue to assess barriers to discharge/monitor patient progress toward functional and medical goals  Care Tool:  Bathing    Body parts bathed by patient: Right arm, Left arm, Abdomen, Chest, Front perineal area, Left upper leg, Right upper leg, Face, Left lower leg, Right lower leg, Buttocks   Body parts bathed by helper: Buttocks     Bathing assist Assist Level: Supervision/Verbal cueing     Upper Body Dressing/Undressing Upper body dressing   What is the patient wearing?: Pull over shirt    Upper body assist Assist Level: Independent    Lower Body Dressing/Undressing Lower body dressing      What is the patient wearing?: Pants, Underwear/pull up     Lower body assist Assist for lower body dressing: Contact Guard/Touching assist     Toileting Toileting    Toileting assist Assist for toileting: Independent with assistive device Assistive Device Comment: urinal   Transfers Chair/bed transfer  Transfers assist     Chair/bed transfer assist level:  Minimal Assistance - Patient > 75%     Locomotion Ambulation   Ambulation assist      Assist level: Minimal Assistance - Patient > 75% Assistive device: Walker-rolling Max distance: 30 ft   Walk 10 feet activity   Assist     Assist level: Minimal Assistance - Patient > 75% Assistive device: Walker-rolling   Walk 50 feet activity   Assist Walk 50 feet with 2 turns activity did not occur: Safety/medical concerns         Walk 150 feet activity   Assist Walk 150 feet activity did not occur: Safety/medical concerns         Walk 10 feet on uneven surface  activity   Assist Walk 10 feet on uneven surfaces activity did not occur: Safety/medical concerns         Wheelchair     Assist Is the patient using a wheelchair?: Yes Type of Wheelchair: Manual    Wheelchair assist level: Set up assist Max  wheelchair distance: 250 ft    Wheelchair 50 feet with 2 turns activity    Assist        Assist Level: Supervision/Verbal cueing   Wheelchair 150 feet activity     Assist      Assist Level: Supervision/Verbal cueing   Blood pressure 108/73, pulse (!) 57, temperature 98.5 F (36.9 C), resp. rate 14, height 5\' 10"  (1.778 m), weight 71.7 kg, SpO2 95 %.  Assessment and plan: Medical Problem List and Plan: 1. 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  Antithrombotics: -DVT/anticoagulation:  Mechanical: Sequential compression devices, below knee Bilateral lower extremities             -antiplatelet therapy: N/A 3. Pain: continue prn oxycodone.              9/14- will change ms contin to methadone 10 mg TID- if cannot come to a decent improvement in pain, will need to switch back- if doesn't work, switch back to MS Contin but do 30 mg TID, instead of 45 mg BID.              9/15- pt reports pain still really bothering him- in at level SCI pain- at T10/11- but explained MS Contin won't help as much. Methadone will change in 4 days to 15 mg BID- can change back if need be, but would prefer BID since methadone increases half life over 7 days.               9/17 - Patient refusing therapies d/t poor pain control. Note methadone increase pending in AM; will hold off on further medication adjustments  9/18 Methadone up to 15mg  BID, continue to monitor response  9/20 pain improving, continue current medications   4. Mood/Behavior/Sleep: LCSW to follow for evaluation and support.              -antipsychotic agents: N/A 5. Neuropsych/cognition: This patient is capable of making decisions on his own behalf. 6. Skin/Wound Care: Monitor back incision for healing.  --Routine pressure relief measures.  - 9/16 - appears stable. Per  neurosurgery no acute intervention, likely resolve with time.  CBC stable  7. Fluids/Electrolytes/Nutrition: Monitor  I/O. Wife to bring food from home as does not like food her.  --Check CMET in am.  8. MRSA bacteremia: Continue Cefepime for 6 weeks w/end date 10/             --lifelong suppression w/doxycycline and cipro?  9. T2DM: Hgb A1c--7.1. Will monitor BS ac/hs- has libre and does not want finger stuck-.  Did get couple of doses of steroids with BS labile. --continue Levemir and SSI has been refused by patient-->people die in -hospital. -=He was able to get A1C down to 7 by diet and plans to continue that. ---will continue to educate on importance of BS control.              9/14- BG control decent- con't regimen and monitoring 9/18 glucose 97 on BMP, indicating good control 9/20 discussed benefits of carb modified diet, per pt request will change to regular diet 10. Leucocytosis: WBC has been ranging from 10-11 range. Repeat WBC tomorrow             9/14- WBC 13k- hasn't been checked in 11 days- will recheck again in am to see if going up or down.              9/15- WBC down to 11.6k- so will con't to monitor weekly.   9/18 WBC down to 9.7 11. Constipation?: No BM documented  --per patient bowels have been working and feels that NO laxative needed.  \             9/14- says LBM last night- refuses bowel meds.              9/20 sorbitol ordered 12. Itching             9/14- asked for atarax to be scheduled q8 hours.  13. At level SCI pain             9/15- start Cymbalta 30 mg QHS x 5 days then 60 mg QHS.              9/16, 9/17 - stable  9/19 Increase Cymbalta dose to 60mg  BID 14. Diastolic HF             9/70 - received notification from nursing that family was reviewing an informaiton packet in the patient's room and was upset to find literature on managing heart failure. Family was not aware patient carried this diagnosis. On chart review, was mentioned on admission that patient  had "chronic diastolic heart failure" based on an echo from 05/2021, which showed grade 1 dysfunction. Printed these results and discussed with patient, recommended PCP f/u at DC as he remains asymptomatic.  15. Anemia.  -HGB improved to 9.2 on 9/18, follow  LOS: 7 days A FACE TO FACE EVALUATION WAS PERFORMED  Jennye Boroughs 05/15/2022, 11:25 AM    **addendum** r

## 2022-05-15 NOTE — Progress Notes (Addendum)
Patient refused finger sticks or sliding scale insulin. States"I control my diabetes with diet and exercise and monitor my blood sugars". Patient is using his personal monitor to check his blood sugars.

## 2022-05-16 ENCOUNTER — Other Ambulatory Visit (HOSPITAL_COMMUNITY): Payer: Self-pay

## 2022-05-16 DIAGNOSIS — B9562 Methicillin resistant Staphylococcus aureus infection as the cause of diseases classified elsewhere: Secondary | ICD-10-CM

## 2022-05-16 DIAGNOSIS — R7881 Bacteremia: Secondary | ICD-10-CM

## 2022-05-16 MED ORDER — HYDROXYZINE HCL 10 MG PO TABS
10.0000 mg | ORAL_TABLET | Freq: Three times a day (TID) | ORAL | 0 refills | Status: DC
  Filled 2022-05-16: qty 90, 30d supply, fill #0

## 2022-05-16 MED ORDER — GABAPENTIN 400 MG PO CAPS
400.0000 mg | ORAL_CAPSULE | Freq: Two times a day (BID) | ORAL | 0 refills | Status: DC
  Filled 2022-05-16: qty 60, 30d supply, fill #0

## 2022-05-16 MED ORDER — DOXYCYCLINE HYCLATE 100 MG PO TABS
100.0000 mg | ORAL_TABLET | Freq: Two times a day (BID) | ORAL | 0 refills | Status: DC
  Filled 2022-05-16: qty 60, 30d supply, fill #0

## 2022-05-16 MED ORDER — DULOXETINE HCL 60 MG PO CPEP
60.0000 mg | ORAL_CAPSULE | Freq: Every day | ORAL | 0 refills | Status: DC
  Filled 2022-05-16: qty 30, 30d supply, fill #0

## 2022-05-16 MED ORDER — METHADONE HCL 5 MG PO TABS
15.0000 mg | ORAL_TABLET | Freq: Two times a day (BID) | ORAL | 0 refills | Status: DC
  Filled 2022-05-16: qty 30, 5d supply, fill #0

## 2022-05-16 MED ORDER — BACLOFEN 5 MG PO TABS
1.0000 | ORAL_TABLET | Freq: Three times a day (TID) | ORAL | 5 refills | Status: DC
  Filled 2022-05-16: qty 90, 30d supply, fill #0

## 2022-05-16 NOTE — Progress Notes (Signed)
Occupational Therapy Session Note  Patient Details  Name: DEANDRA GADSON MRN: [id number] Date of Birth: 1966-04-23  Today's Date: 05/16/2022 OT Individual Time: 1975-8832 OT Individual Time Calculation (min): 70 min    Short Term Goals: Week 1:  OT Short Term Goal 1 (Week 1): Pt will perform toileting with CGA with use of LRAD and grab bars as needed. OT Short Term Goal 2 (Week 1): Pt will perform toilet transfer with CGA for safety with LRAD and grab bars as needed. OT Short Term Goal 3 (Week 1): Pt will complete UB/LB dressing with CGA with use of AE as needed while maintaining spinal precautions.  Skilled Therapeutic Interventions/Progress Updates:    Pt resting in w/c upon arrival with IV running. Unable to shower 2/2 IV running. OT intervention with focus on w/c mobility, BUE activities, BUE therex, discharge planning, and activity tolerance. W/c mobility with supervision. Chest presses 4x10 with 1# bar. Biceps curls 4x10 with 3# barbell. Ball tapping with 1# bar 4x12. SciFit 10 mins level 3.5. Rest breaks between each set. Pt returned to room and remained in w/c. Wife present.   Therapy Documentation Precautions:  Precautions Precautions: Back, Fall Precaution Booklet Issued: No Precaution Comments: impulsivity, motor control deficits for LLE, verbally reviewed precautions, pt recalled 3/3 Required Braces or Orthoses:  (per chart review - no brace needed per MD) Restrictions Weight Bearing Restrictions: No    Pain: Pt reported 6/10 Lt low back pain at beginning of session and 8/10 at end of session; meds requested from nursing staff Therapy/Group: Individual Therapy  Leroy Libman 05/16/2022, 12:04 PM

## 2022-05-16 NOTE — Progress Notes (Signed)
Physical Therapy Discharge Summary  Patient Details  Name: John Ware MRN: [id number] Date of Birth: [birth year]  Date of Discharge from PT service:May 16, 2022  Today's Date: 05/16/2022 PT Individual Time: 0737-1062 [id number] PT Individual Time Calculation (min): 70 min, 53 min    Patient has met 7 of 7 long term goals due to improved activity tolerance, improved balance, increased strength, decreased pain, and ability to compensate for deficits.  Patient to discharge at a wheelchair level Modified Independent.   Patient's care partner is independent to provide the necessary physical assistance at discharge. Pt to d/c home with his wife at a mod I level for mobility. Pt is able to walk up to 150 ft but prefers to use w/c for longer distances at this time for pain management. Provided family education with pt's wife who expressed understanding.   Reasons goals not met: NA  Recommendation:  Patient will benefit from ongoing skilled PT services in home health setting to continue to advance safe functional mobility, address ongoing impairments in strength, pain management, ROM, gait mechanics, and minimize fall risk.  Equipment: No equipment provided  Reasons for discharge: treatment goals met and discharge from hospital  Patient/family agrees with progress made and goals achieved: Yes  Skilled Therapeutic Interventions/Progress Updates:  Session 1: pt received in bed and agreeable to therapy. Bed mobility mod I with HOB elevated, which pt has at home. ambulatory transfer to w/c with RW, mod I. Pt reports 4/10 pain at rest, premedicated. Rest and positioning provided as needed. Some unrated increase with mobility, no other interventions required. Pt propelled w/c with BUE mod I throughout session, up to 400 ft. Pt then performed car transfer with set up A for RW. Ramp mod I with RW, pt reports descending incr difficulty over ascending. Pt then navigated mulch with supervision and  curb with CGA and cues for technique. Pt navigated stairs x 8 with CGA and cueing for descending with weaker leg. MMT and sensory testing performed in sitting as documented below. Pt then utilized nustep x 15 min at level 4 for global strength while discussing d/c plan. Pt with no concerns and feels ready to go home. Pt returned to room and remained in w/c with all needs in reach.   Session 2: Pt seated in w/c on arrival and agreeable to therapy. His wife, [name], present for family education. Pt reports pain 3-4/10 controlled by medication at this time. Pt propelled w/c with BUE throughout session, up to 500 ft. Pt able to manage w/c parts independently. Pt ambulated x 170 ft and x 200 ft with RW, mod I. Supervision required last 50 ft d/t fatigue and worsening ataxia. Pt with scissoring gait with fatigue and reports knee buckling but is able to stabilize with RW. Discussed [name]'s ability to navigate w/c in car and demonstrated pt's improved independence. Pt performed car transfer with set up for RW. Pt also navigated 6" steps with 2 hand rails and CGA-supervision with both therapist and [name]. Discussed descending with LLE for safety. Pt returned to room after session. Therapist ensured all questions were answered as able and pt remained seated in w/c with his wife present, with nsg entering for family training.   PT Discharge Precautions/Restrictions Precautions Precautions: Back;Fall Precaution Comments: impulsivity, motor control deficits for LLE, verbally reviewed precautions, pt recalled 3/3 Restrictions Weight Bearing Restrictions: No  Pain Interference Pain Interference Pain Effect on Sleep: 1. Rarely or not at all Pain Interference with Therapy Activities: 2. Occasionally Pain  Interference with Day-to-Day Activities: 1. Rarely or not at all Vision/Perception  Vision - History Ability to See in Adequate Light: 0 Adequate Perception Perception: Within Functional  Limits Praxis Praxis: Intact  Cognition Overall Cognitive Status: Within Functional Limits for tasks assessed Arousal/Alertness: Awake/alert Orientation Level: Oriented X4 Attention: Selective Focused Attention: Appears intact Sustained Attention: Appears intact Selective Attention: Appears intact Memory: Appears intact Awareness: Appears intact Problem Solving: Appears intact Safety/Judgment: Appears intact Sensation Sensation Light Touch: Impaired Detail Peripheral sensation comments: Diminished from ankle down, L>R, pt reports paresthesia in BIL feet Light Touch Impaired Details: Impaired RLE;Impaired LLE Hot/Cold: Appears Intact Proprioception: Appears Intact Stereognosis: Not tested Coordination Gross Motor Movements are Fluid and Coordinated: Yes Fine Motor Movements are Fluid and Coordinated: Yes Coordination and Movement Description: Pt presents with improved ataxic-like movements in LLE with ambulation. Finger Nose Finger Test: WNL Motor  Motor Motor: Ataxia Motor - Skilled Clinical Observations: motor control deficits noted in BLE with LLE > RLE Motor - Discharge Observations: Improved from baseline, still with mild ataxia  Mobility Bed Mobility Bed Mobility: Supine to Sit;Sit to Supine Supine to Sit: Independent with assistive device Sit to Supine: Independent with assistive device Transfers Transfers: Sit to Stand;Stand to Sit;Stand Pivot Transfers Sit to Stand: Independent with assistive device Stand to Sit: Independent with assistive device Stand Pivot Transfers: Independent with assistive device Locomotion  Gait Ambulation: Yes Gait Assistance: Independent with assistive device Gait Distance (Feet): 150 Feet Assistive device: Rolling walker Gait Gait: Yes Gait Pattern: Ataxic;Scissoring;Step-through pattern Stairs / Additional Locomotion Stairs: Yes Stairs Assistance: Contact Guard/Touching assist Stair Management Technique: Two rails Number of  Stairs: 8 Height of Stairs: 6 Ramp: Independent with assistive device Curb: Contact Guard/Touching assist Pick up small object from the floor assist level: Supervision/Verbal cueing Pick up small object from the floor assistive device: Art gallery manager Mobility: Yes Wheelchair Assistance: Independent with Camera operator: Both upper extremities Wheelchair Parts Management: Independent Distance: 500 ft  Trunk/Postural Assessment  Cervical Assessment Cervical Assessment: Within Functional Limits Thoracic Assessment Thoracic Assessment: Within Functional Limits Lumbar Assessment Lumbar Assessment: Within Functional Limits Postural Control Postural Control: Within Functional Limits  Balance Balance Balance Assessed: Yes Static Sitting Balance Static Sitting - Balance Support: Feet supported Static Sitting - Level of Assistance: 6: Modified independent (Device/Increase time) Dynamic Sitting Balance Dynamic Sitting - Balance Support: During functional activity Dynamic Sitting - Level of Assistance: 6: Modified independent (Device/Increase time) Static Standing Balance Static Standing - Balance Support: Bilateral upper extremity supported Static Standing - Level of Assistance: 6: Modified independent (Device/Increase time) Dynamic Standing Balance Dynamic Standing - Balance Support: Bilateral upper extremity supported Dynamic Standing - Level of Assistance: 6: Modified independent (Device/Increase time) Extremity Assessment  RUE Assessment RUE Assessment: Within Functional Limits LUE Assessment LUE Assessment: Within Functional Limits RLE Assessment RLE Assessment: Exceptions to [facility] RLE Strength Right Hip Flexion: 3+/5 Right Knee Flexion: 4/5 Right Knee Extension: 4-/5 Right Ankle Dorsiflexion: 4+/5 Right Ankle Plantar Flexion: 3+/5 LLE Assessment LLE Assessment: Exceptions to [facility] LLE Strength Left Hip Flexion: 3/5 Left Knee  Flexion: 3+/5 Left Knee Extension: 3+/5 Left Ankle Dorsiflexion: 4-/5 Left Ankle Plantar Flexion: 4-/5   Cypress Fanfan C Corry Storie 05/16/2022, 9:40 AM

## 2022-05-16 NOTE — Progress Notes (Signed)
Occupational Therapy Discharge Summary  Patient Details  Name: John Ware MRN: [id number] Date of Birth: [birth year]  Date of Discharge from OT service:May 16, 2022   Patient has met 6 of 6 long term goals due to improved activity tolerance, improved balance, postural control, and ability to compensate for deficits.  Pt made steady progress with BADLs and functional transfers during this admission. Pt completes bathing/dressing and toileting tasks with supervision. All functional tranfsers with supervision. Pt's wife has participated in therapy and provides the appropriate level of assistance/supervision. Patient to discharge at overall Supervision level.  Patient's care partner is independent to provide the necessary physical assistance at discharge.    Reasons goals not met: n/a  Recommendation:  Patient will benefit from ongoing skilled OT services in home health setting to continue to advance functional skills in the area of BADL, iADL, and Reduce care partner burden.  Equipment: No equipment provided  Reasons for discharge: treatment goals met  Patient/family agrees with progress made and goals achieved: Yes  OT Discharge ADL ADL Eating: Independent Where Assessed-Eating: Wheelchair Grooming: Independent Where Assessed-Grooming: Wheelchair Upper Body Bathing: Supervision/safety Where Assessed-Upper Body Bathing: Shower Lower Body Bathing: Supervision/safety Where Assessed-Lower Body Bathing: Shower Upper Body Dressing: Independent Where Assessed-Upper Body Dressing: Wheelchair Lower Body Dressing: Supervision/safety Where Assessed-Lower Body Dressing: Wheelchair Toileting: Supervision/safety Where Assessed-Toileting: Glass blower/designer: Distant supervision Armed forces technical officer Method: Counselling psychologist: Energy manager: Close supervision Social research officer, government Method: Heritage manager: Actor ADL Comments: Pt able to perform bed mobility with supervision. Pt able to recall 0/3 spinal precautions with OT providing education. Pt able to perform SPT from w/c <> EOB with CGA for safety with FWW. OT offered assistance with a shower or bathing with pt declining at this time. Vision Baseline Vision/History: 1 Wears glasses Patient Visual Report: No change from baseline Vision Assessment?: No apparent visual deficits Perception  Perception: Within Functional Limits Praxis Praxis: Intact Cognition Cognition Overall Cognitive Status: Within Functional Limits for tasks assessed Arousal/Alertness: Awake/alert Orientation Level: Person;Place;Situation Person: Oriented Place: Oriented Situation: Oriented Memory: Appears intact Attention: Selective Focused Attention: Appears intact Sustained Attention: Appears intact Selective Attention: Appears intact Awareness: Appears intact Problem Solving: Appears intact Safety/Judgment: Appears intact Brief Interview for Mental Status (BIMS) Repetition of Three Words (First Attempt): 3 Temporal Orientation: Year: Correct Temporal Orientation: Month: Accurate within 5 days Temporal Orientation: Day: Correct Recall: "Sock": Yes, no cue required Recall: "Blue": Yes, no cue required Recall: "Bed": Yes, no cue required BIMS Summary Score: 15 Sensation Sensation Light Touch: Impaired Detail Peripheral sensation comments: Diminished BIL R>L, absent RLE shins and below Light Touch Impaired Details: Impaired RLE;Impaired LLE Hot/Cold: Appears Intact Proprioception: Appears Intact Stereognosis: Not tested Coordination Gross Motor Movements are Fluid and Coordinated: Yes Fine Motor Movements are Fluid and Coordinated: Yes Finger Nose Finger Test: WNL Motor  Motor Motor: Ataxia Motor - Skilled Clinical Observations: motor control deficits noted in BLE with LLE > RLE    Trunk/Postural Assessment  Cervical Assessment Cervical  Assessment: Within Functional Limits Thoracic Assessment Thoracic Assessment: Within Functional Limits Lumbar Assessment Lumbar Assessment: Within Functional Limits Postural Control Postural Control: Within Functional Limits  Balance Static Sitting Balance Static Sitting - Balance Support: Feet supported Static Sitting - Level of Assistance: 6: Modified independent (Device/Increase time) Dynamic Sitting Balance Dynamic Sitting - Balance Support: During functional activity Dynamic Sitting - Level of Assistance: 6: Modified independent (Device/Increase time) Extremity/Trunk Assessment RUE Assessment RUE Assessment: Within  Functional Limits LUE Assessment LUE Assessment: Within Functional Limits   Leroy Libman 05/16/2022, 6:44 AM

## 2022-05-16 NOTE — Progress Notes (Signed)
PROGRESS NOTE   Subjective/Complaints:  Discharge home planned for tomorrow. Reports pain was increased last night but doing better this AM.  LBM 9/16     05/16/2022    6:00 AM 05/16/2022    5:27 AM 05/15/2022    7:35 PM  Vitals with BMI  Weight 158 lbs 5 oz    BMI [id number]    Systolic  [id number]  Diastolic  83 80  Pulse  66 78     ROS: Denies fevers, chills, N/V, abdominal pain, constipation, diarrhea, HA, SOB, cough, chest pain  Objective:   No results found. No results for input(s): "WBC", "HGB", "HCT", "PLT" in the last 72 hours.  No results for input(s): "NA", "K", "CL", "CO2", "GLUCOSE", "BUN", "CREATININE", "CALCIUM" in the last 72 hours.   Intake/Output Summary (Last 24 hours) at 05/16/2022 0813 Last data filed at 05/16/2022 0543 Gross per 24 hour  Intake 1583.56 ml  Output 750 ml  Net 833.56 ml         Physical Exam: Vital Signs reviewed as above.   General: awake, alert, appropriate, supine in bed; NAD HENT: conjugate gaze; oropharynx moist CV: regular rate; no JVD Pulmonary: CTA B/L; no W/R/R- good air movement GI: soft, NT, ND, (+)BS Psychiatric: appropriate, pleasant Neurological: Ox3,follows commands   Musculoskeletal:     Comments: Left shoulder with atrophy and limited ROM. Biceps 3/5. LLE ataxic.  Otherwise with 5/5 strength. Insulin monitoring device on LUE Skin:    Comments: Back incision C/D/I with two fluctuant areas no drainage- no redness- appears slightly improved from prior  Buttocks- looks like healing heat rash Neurological:     Mental Status: He is alert and oriented to person, place, and time. Speech is fluent        Assessment/Plan: 1. Functional deficits which require 3+ hours per day of interdisciplinary therapy in a comprehensive inpatient rehab setting. Physiatrist is providing close team supervision and 24 hour management of active medical problems listed  below. Physiatrist and rehab team continue to assess barriers to discharge/monitor patient progress toward functional and medical goals  Care Tool:  Bathing    Body parts bathed by patient: Right arm, Left arm, Abdomen, Chest, Front perineal area, Left upper leg, Right upper leg, Face, Left lower leg, Right lower leg, Buttocks   Body parts bathed by helper: Buttocks     Bathing assist Assist Level: Supervision/Verbal cueing     Upper Body Dressing/Undressing Upper body dressing   What is the patient wearing?: Pull over shirt    Upper body assist Assist Level: Independent    Lower Body Dressing/Undressing Lower body dressing      What is the patient wearing?: Pants, Underwear/pull up     Lower body assist Assist for lower body dressing: Supervision/Verbal cueing     Toileting Toileting    Toileting assist Assist for toileting: Supervision/Verbal cueing Assistive Device Comment: urinal   Transfers Chair/bed transfer  Transfers assist     Chair/bed transfer assist level: Minimal Assistance - Patient > 75%     Locomotion Ambulation   Ambulation assist      Assist level: Minimal Assistance - Patient > 75% Assistive device: Walker-rolling  Max distance: 30 ft   Walk 10 feet activity   Assist     Assist level: Minimal Assistance - Patient > 75% Assistive device: Walker-rolling   Walk 50 feet activity   Assist Walk 50 feet with 2 turns activity did not occur: Safety/medical concerns         Walk 150 feet activity   Assist Walk 150 feet activity did not occur: Safety/medical concerns         Walk 10 feet on uneven surface  activity   Assist Walk 10 feet on uneven surfaces activity did not occur: Safety/medical concerns         Wheelchair     Assist Is the patient using a wheelchair?: Yes Type of Wheelchair: Manual    Wheelchair assist level: Set up assist Max wheelchair distance: 250 ft    Wheelchair 50 feet with 2  turns activity    Assist        Assist Level: Supervision/Verbal cueing   Wheelchair 150 feet activity     Assist      Assist Level: Supervision/Verbal cueing   Blood pressure 138/83, pulse 66, temperature 97.9 F (36.6 C), temperature source Oral, resp. rate 16, height 5\' 10"  (1.778 m), weight 71.8 kg, SpO2 100 %.  Assessment and plan: Medical Problem List and Plan: 1. 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  Antithrombotics: -DVT/anticoagulation:  Mechanical: Sequential compression devices, below knee Bilateral lower extremities             -antiplatelet therapy: N/A 3. Pain: continue prn oxycodone.              9/14- will change ms contin to methadone 10 mg TID- if cannot come to a decent improvement in pain, will need to switch back- if doesn't work, switch back to MS Contin but do 30 mg TID, instead of 45 mg BID.              9/15- pt reports pain still really bothering him- in at level SCI pain- at T10/11- but explained MS Contin won't help as much. Methadone will change in 4 days to 15 mg BID- can change back if need be, but would prefer BID since methadone increases half life over 7 days.               9/17 - Patient refusing therapies d/t poor pain control. Note methadone increase pending in AM; will hold off on further medication adjustments  9/18 Methadone up to 15mg  BID, continue to monitor response  9/21 Pain continues to improved, continue current treatment   4. Mood/Behavior/Sleep: LCSW to follow for evaluation and support.              -antipsychotic agents: N/A 5. Neuropsych/cognition: This patient is capable of making decisions on his own behalf. 6. Skin/Wound Care: Monitor back incision for healing.  --Routine pressure relief measures.  - 9/16 - appears stable. Per  neurosurgery no acute intervention, likely resolve with time.  CBC stable WBC WNL 9/18  7. Fluids/Electrolytes/Nutrition: Monitor I/O. Wife to bring food from home as does not like food her.  --Check CMET in am.  8. MRSA bacteremia:  Continue Cefepime for 6 weeks w/end date 06/13/22             --lifelong suppression w/doxycycline and cipro?  9. T2DM: Hgb A1c--7.1. Will monitor BS ac/hs- has libre and does not want finger stuck-.  Did get couple of doses of steroids with BS labile. --continue Levemir and SSI has been refused by patient-->people die in -hospital. -=He was able to get A1C down to 7 by diet and plans to continue that. ---will continue to educate on importance of BS control.              9/14- BG control decent- con't regimen and monitoring 9/18 glucose 97 on BMP, indicating good control 9/20 discussed benefits of carb modified diet, per pt request will change to regular diet 10. Leucocytosis: WBC has been ranging from 10-11 range. Repeat WBC tomorrow             9/14- WBC 13k- hasn't been checked in 11 days- will recheck again in am to see if going up or down.              9/15- WBC down to 11.6k- so will con't to monitor weekly.   9/18 WBC down to 9.7 11. Constipation?: No BM documented  --per patient bowels have been working and feels that NO laxative needed.  \             9/14- says LBM last night- refuses bowel meds.              LBM 9/19, improved 12. Itching             9/14- asked for atarax to be scheduled q8 hours.  13. At level SCI pain             9/15- start Cymbalta 30 mg QHS x 5 days then 60 mg QHS.              9/16, 9/17 - stable  9/19 Increase Cymbalta dose to 60mg  BID Improved 14. Diastolic HF             [id number] - received notification from nursing that family was reviewing an informaiton packet in the patient's room and was upset to find literature on managing heart failure. Family was not aware patient carried this diagnosis. On chart review, was mentioned  on admission that patient had "chronic diastolic heart failure" based on an echo from 05/2021, which showed grade 1 dysfunction. Printed these results and discussed with patient, recommended PCP f/u at DC as he remains asymptomatic.  15. Anemia.  -HGB improved to 9.2 on 9/18, follow  LOS: 8 days A FACE TO FACE EVALUATION WAS PERFORMED  Jennye Boroughs 05/16/2022, 8:13 AM    **addendum** r

## 2022-05-17 ENCOUNTER — Other Ambulatory Visit (HOSPITAL_COMMUNITY): Payer: Self-pay

## 2022-05-17 MED ORDER — OXYCODONE-ACETAMINOPHEN 10-325 MG PO TABS: 1.0000 | ORAL_TABLET | Freq: Four times a day (QID) | ORAL | 0 refills | Status: DC | PRN

## 2022-05-17 MED ORDER — OXYCODONE-ACETAMINOPHEN 10-325 MG PO TABS
1.0000 | ORAL_TABLET | Freq: Four times a day (QID) | ORAL | 0 refills | Status: DC | PRN
  Filled 2022-05-17: qty 28, 7d supply, fill #0

## 2022-05-17 NOTE — Discharge Summary (Signed)
Physician Discharge Summary  Patient ID: John Ware MRN: [id number] DOB/AGE: April 22, 1966 56 y.o.  Admit date: 05/08/2022 Discharge date: 05/17/2022  Discharge Diagnoses:  Principal Problem:   Osteomyelitis of lumbar spine ([place]) Active Problems:   History of depression   Discharged Condition: {condition:18240}  Significant Diagnostic Studies: [place] EKG SITE RITE  Result Date: 05/03/2022 If Site Rite image not attached, placement could not be confirmed due to current cardiac rhythm.  DG THORACOLUMABAR SPINE  Result Date: 05/02/2022 CLINICAL DATA:  Thoracic laminectomy. EXAM: THORACOLUMBAR SPINE 1V COMPARISON:  None Available. FINDINGS: Two intraoperative fluoroscopic spot images provided. The total fluoroscopic time is 7.7 seconds and air [place] 5.13 mGy. IMPRESSION: Intraoperative fluoroscopic spot images. Electronically Signed   By: Anner Crete M.D.   On: 05/02/2022 19:34   DG C-Arm 1-60 Min-No Report  Result Date: 05/02/2022 Fluoroscopy was utilized by the requesting physician.  No radiographic interpretation.   MR THORACIC SPINE W WO CONTRAST  Result Date: 05/01/2022 CLINICAL DATA:  Osteomyelitis EXAM: MRI THORACIC WITHOUT AND WITH CONTRAST TECHNIQUE: Multiplanar and multiecho pulse sequences of the thoracic spine were obtained without and with intravenous contrast. CONTRAST:  7.49m GADAVIST GADOBUTROL 1 MMOL/ML IV SOLN COMPARISON:  Thoracic spine MRI 04/23/2022 FINDINGS: Alignment:  Normal. Vertebrae: There is STIR signal abnormality and enhancement in the T9 through T12 vertebral bodies, worst at T10 and T11, increased at T9 and T12 since 04/23/2022 there is fluid signal and enhancement in the intervening T10-T11 disc space. The other disc spaces are spared. There is abnormal enhancement about the left T10-T11 facet joint which remains concerning for septic arthritis (21-15, 24-41). There is epidural phlegmon extending along the posterior endplates at T9 through T12 measuring up to  proximally 4 mm in maximal thickness with small foci of non enhancement (24-36, 24-38), which remains consistent with epidural phlegmon/abscess. This again results in severe spinal canal stenosis at T10-T11 and T11-T12 with cord compression and now with possible cord signal abnormality (18-35) which is slightly increased in conspicuity since the prior study. Enhancing tissue in the paraspinal soft tissues from T9 through T12 likely also reflecting phlegmon is not significantly changed. Sequela of prior discitis/osteomyelitis at T3-T4 is unchanged with unchanged obliteration of the intervening disc space. Marrow signal at the other levels is within normal limits. Cord: Possible cord signal abnormality at T10-T11 as above. The cord above this level is normal signal and morphology. Paraspinal and other soft tissues: Again seen is a fluid collection in the posterior soft tissues to the left of midline at the T11-T12 level measuring 4.2 cm TV x 1.1 cm AP by 3.3 cm cc, not significantly changed. There is no convincing peripheral enhancement. Diffuse enhancement in the posterior soft tissues throughout the midthoracic spine is stable to slightly decreased. Abnormal enhancement the paraspinal soft tissues from T9 through T12 is not significantly changed, as above. There are small left and trace right pleural effusions, not significantly changed. Disc levels: Scattered disc protrusions are again seen, most notable at T4-T5, T5-T6, and T8-T9 resulting in mild spinal canal stenosis with flattening of the ventral cord surface. Multilevel facet arthropathy with neural foraminal stenosis most advanced at T10-T11 and T11-T12 is similar to the prior study. IMPRESSION: 1. Findings consistent with persistent osteomyelitis at T9 through T12, with signal abnormality having worsened at T9 and T12 since 04/23/2022. 2. Findings remain concerning for left septic arthritis at T11-T12. 3. Persistent epidural phlegmon/abscess extending along  the posterior endplates from T9 through T12 measuring up to 4 mm  in thickness resulting in severe spinal canal stenosis at T10-T11 and T11-T12, with possible new cord signal abnormality at T10-T11. 4. Unchanged enhancement of the paraspinal soft tissues at T9 through T12 without evidence of paraspinal abscess. 5. Unchanged fluid collection in the left posterior paraspinal soft tissues at T11-T12. 6. Stable to slightly decreased diffuse enhancement in the posterior soft tissues extending from T2 through T7. 7. Unchanged small left and trace right pleural effusions. Electronically Signed   By: Valetta Mole M.D.   On: 05/01/2022 13:45   [place] Abscess Drain  Result Date: 04/25/2022 INDICATION: Briefly, 56 year old male with history of L-spine osteomyelitis presenting with back pain with MR T-spine revealing osteomyelitis discitis of the thoracic spine with superficial phlegmon/abscess. EXAM: ULTRASOUND GUIDED LEFT SUPERFICIAL THORACIC ABSCESS ASPIRATION COMPARISON:  IR fluoroscopy, 04/24/2022.  MR T-spine, 04/23/2022. MEDICATIONS: The patient is currently admitted to the hospital and receiving intravenous antibiotics. The antibiotics were administered within an appropriate time frame prior to the initiation of the procedure. ANESTHESIA/SEDATION: Local anesthetic was administered. CONTRAST:  None COMPLICATIONS: None immediate. PROCEDURE: Informed written consent was obtained from the patient after a discussion of the risks, benefits and alternatives to treatment. Preprocedural ultrasound scanning demonstrated a heterogeneously echogenic LEFT superficial back partial fluid collection. A timeout was performed prior to the initiation of the procedure. The LEFT back was prepped and draped in the usual sterile fashion. The overlying soft tissues were anesthetized with 1% lidocaine with epinephrine. Under direct ultrasound guidance, a 18 gauge trocar needle was advanced into the abscess/fluid collection. Multiple ultrasound  images were saved for procedural documentation purposes. Next, approximately 1 mL of serosanguineous fluid was aspirated from the collection. A representative sample of aspirated fluid was capped and sent to the laboratory for analysis. The needle was removed and superficial hemostasis was achieved with manual compression. A dressing was placed. The patient tolerated the procedure well without immediate postprocedural complication. IMPRESSION: Successful US guided aspiration of 1 mL of serosanguineous fluid from LEFT superficial thoracic collection. A representative aspirated sample was sent to the laboratory as requested by the ordering clinical team. Michaelle Birks, MD [facility] Electronically Signed   By: Michaelle Birks M.D.   On: 04/25/2022 12:35   IR Fluoro Guide Ndl Plmt / BX  Result Date: 04/24/2022 INDICATION: RAEQWON LUX is a 56 y.o. male with medical history significant for osteomyelitis of the lumbar spine, chronic diastolic heart failure, and type 2 diabetes mellitus. He presented to the ED on 04/22/2022 complaining of back pain and was found to have acute osteomyelitis/discitis of the thoracic spine. He comes to our service today for a T10-11 fluoroscopy guided disc aspiration. EXAM: FLUOROSCOPY GUIDED T10-11 INTERVERTEBRAL DISC ASPIRATION MEDICATIONS: The patient is currently admitted to the hospital and receiving intravenous antibiotics. The antibiotics were administered within an appropriate time frame prior to the initiation of the procedure. ANESTHESIA/SEDATION: Moderate (conscious) sedation was employed during this procedure. A total of Versed 3.5 mg and Fentanyl 175 mcg were administered intravenously for moderate conscious sedation monitored under my direct supervision. Total intraservice time of sedation was 27 minutes. The patient's vital signs were monitored throughout the procedure and recorded in the patient's medical record by  the nurse. COMPLICATIONS: None immediate. PROCEDURE: Informed written consent was obtained from the patient after a thorough discussion of the procedural risks, benefits and alternatives. All questions were addressed. Maximal Sterile Barrier Technique was utilized including caps, mask, sterile gowns, sterile gloves, sterile drape, hand hygiene and skin  antiseptic. A timeout was performed prior to the initiation of the procedure. The patient was placed in prone position on the angiography table. The thoracic spine region was prepped and draped in a sterile fashion. Under fluoroscopy, the T10-11 was delineated and the skin area was marked. The skin was infiltrated with a 1% Lidocaine approximately 4 cm lateral to the spinous process projection on the left. Under fluoroscopy guidance, an 18-gauge trocar needle was into the T10-11 disc space using a left posterolateral approach. Then, a 22 gauge x 20 cm spinal needle was advanced through the trocar into the disc space and an FNA pass was performed. Given minimal aspirate, the disc space was injected with 1 cc of sterile saline and 1 cc of bupivacaine. An additional FNA pass was performed yielding approximately 1 cc of blood tinged aspirate. The needle was subsequently withdrawn. The access sites were cleaned and covered with a sterile bandage. Peak skin dose (PSD) 440 mGy. IMPRESSION: Successful fine-needle aspiration of the T10-11 intervertebral disc. Aspirate was sent to the laboratory for Gram stain and culture. Electronically Signed   By: Pedro Earls M.D.   On: 04/24/2022 14:24   MR THORACIC SPINE W WO CONTRAST  Result Date: 04/23/2022 CLINICAL DATA:  Osteomyelitis. EXAM: MRI THORACIC WITHOUT AND WITH CONTRAST TECHNIQUE: Multiplanar and multiecho pulse sequences of the thoracic spine were obtained without and with intravenous contrast. CONTRAST:  24m GADAVIST GADOBUTROL 1 MMOL/ML IV SOLN COMPARISON:  Lumbar spine MRI dated 1 day prior, thoracic  spine MRI 02/15/2021 FINDINGS: Alignment: There is trace retrolisthesis of T3 on T4 which is increased since 02/15/2021. Vertebrae: Vertebral body heights are preserved. Background marrow signal is normal. There is confluent T1 hypointensity with associated STIR hyperintensity and postcontrast enhancement through the entire T10 and T11 vertebral bodies as well as the T10-T11 disc space, and to a lesser degree in the T9 and T12 vertebral bodies consistent with discitis/osteomyelitis. There is enhancement along the ventral epidural space extending from T9-T10 through T12 measuring up to 4 mm, and to a lesser degree along the dorsal epidural space measuring approximately 1 mm in thickness. There is central non enhancement which is more apparent on the sagittal images but is also seen on the axial images at T10-T11 (22-10, 21-33) raising suspicion for abscess. This results in severe spinal canal stenosis at T10-T11 with cord compression but no definite cord signal abnormality, and moderate to severe spinal canal stenosis at T11-T12 also without definite cord signal abnormality. There is enhancement and edema about the left facet joint at T11-T12 consistent with associated septic arthritis. There is extensive enhancement in the paraspinal soft tissues both bilaterally and anteriorly likely reflecting additional phlegmon without evidence of paraspinal abscess. There is also enhancement in the posterior paraspinal soft tissues on the left at T11-T12 and in the interspinous spaces at T9-T10 through T11-T12. There is obliteration of the intervening disc space at T3-T4 with grade 1 retrolisthesis, likely reflecting sequela of prior discitis/osteomyelitis at this level. There is no signal abnormality or enhancement to suggest osteomyelitis at this level. Cord: As above, there is cord compression at T10-T11 and T11-T12 without definite cord signal abnormality. There is no abnormal cord enhancement. Paraspinal and other soft  tissues: As above, there is extensive enhancement in the paraspinal soft tissues at T9 through T11 There is additional edema and enhancement in the posterior soft tissues throughout the midthoracic spine extending from T2 through T7. There is a T2 hyperintense collection in the posterior soft tissues  to the left of midline at T11-T12 measuring 2.6 cm TV x 1.2 cm AP x 4.2 cm cc as seen on the lumbar spine MRI from 1 day prior. There is no convincing peripheral enhancement about this collection. Disc levels: There are disc protrusions at C7-T1, T4-T5, T5-T6, T8-T9, and T9-T10 resulting in up to mild spinal canal stenosis with flattening of the ventral cord surface T5-T6 and T8-T9. There is severe spinal canal stenosis at T10-T11 and moderate to severe spinal canal stenosis at T11-T12 due to the above-described abscess. There is multilevel facet arthropathy resulting in up to moderate bilateral neural foraminal stenosis at T10-T11 and T11-T12. IMPRESSION: 1. Findings consistent with discitis/osteomyelitis T9-T10 through T11-T12 (worst at T10-T11) with epidural phlegmon and abscess along the posterior endplates from J5-T01 through T12 measuring up to 4 mm in thickness, resulting in severe spinal canal stenosis and cord compression but no definite cord signal abnormality. 2. Septic arthritis on the left at T11-T12. 3. Extensive enhancement in the paraspinal soft tissues at these levels consistent with additional phlegmonous change without evidence of paraspinal abscess. 4. Additional enhancement throughout the posterior soft tissues in the midthoracic spine extending from T2 through T7 without evidence of fluid collection at these levels. 5. Fluid collection in the posterior soft tissues to the left of midline at T12 without convincing peripheral enhancement may reflect a hematoma or seroma, though abscess is not entirely excluded. 6. Obliteration of the disc space with grade 1 retrolisthesis at T3-T4 likely reflecting  sequela of prior discitis/osteomyelitis at this level. Electronically Signed   By: Valetta Mole M.D.   On: 04/23/2022 14:11   MR Lumbar Spine W Wo Contrast  Result Date: 04/22/2022 CLINICAL DATA:  Initial evaluation for back pain, lower extremity weakness. EXAM: MRI LUMBAR SPINE WITHOUT AND WITH CONTRAST TECHNIQUE: Multiplanar and multiecho pulse sequences of the lumbar spine were obtained without and with intravenous contrast. CONTRAST:  61m GADAVIST GADOBUTROL 1 MMOL/ML IV SOLN COMPARISON:  Prior study from 08/17/2021. FINDINGS: Segmentation: Standard. Lowest well-formed disc space labeled the L5-S1 level. Alignment: Chronic grade 1 anterolisthesis of L5 on S1, stable. Straightening with mild reversal of the normal lumbar lordosis. Vertebrae: Sequelae of prior PLIF at L3 through S1. Interval evolution of previously seen changes of osteomyelitis discitis at L1-2 and L2-3. Mild residual edema within the L1-2 and L2-3 interspaces, improved from prior exam. New findings of probable acute osteomyelitis discitis about the T11-12 interspace, new from prior. Heterogeneous signal intensity within the adjacent ventral epidural space likely reflects epidural phlegmon and/or abscess, seen only on sagittal position (series 5, image 10). Epidural enhancement extends from the lower thoracic spine to approximately the level of T12-L1 (series 12, image 10). Conus medullaris and cauda equina: Conus extends to the L1 level. Conus medullaris within normal limits. Epidural phlegmon/abscess with enhancement extending from the visualized lower thoracic spine to approximately T12-L1. Mild heterogeneity of the proximal cauda equina at the level of L1-2 likely related to prior infection at this level. Remainder of the nerve roots of the cauda equina are within normal limits. Paraspinal and other soft tissues: Persistent edema within the psoas musculature bilaterally. Edema also seen throughout the posterior paraspinous musculature.  Benign postoperative collection centered at the laminectomy site of L4-5, stable. Probable soft tissue abscess within the subcutaneous fat of the left back at the level of T12 measures approximately 2.4 x 1.1 x 2.9 cm (series 8, image 1). Disc levels: T11-12: Seen only on sagittal projection. Findings concerning for osteomyelitis discitis. Epidural  phlegmon/abscess with enhancement seen within the adjacent epidural space. Bilateral facet arthropathy. Resultant mild spinal stenosis. Moderate to severe bilateral foraminal narrowing. T12-L1: Disc desiccation with reactive endplate spurring. Superimposed right subarticular disc protrusion indents the right ventral thecal sac. Superimposed facet hypertrophy. Resultant mild to moderate right lateral recess stenosis. Central canal remains patent. Foramina remain patent as well. L1-2: Interval evolution of changes related to previously identified osteomyelitis discitis. Associated advanced height loss with endplate irregularity. Moderate bilateral facet hypertrophy. No significant spinal stenosis. Mild to moderate bilateral foraminal narrowing. L2-3: Interval evolution of changes related to osteomyelitis discitis within associated advanced disc space height loss. Moderate bilateral facet hypertrophy. No significant spinal stenosis. Moderate to severe bilateral foraminal stenosis, similar to prior. L3-4: Prior PLIF. No residual spinal stenosis. Stable foraminal patency. L4-5: Prior PLIF. No residual spinal stenosis. Stable foraminal patency. L5-S1: Prior PLIF. No residual spinal stenosis. Residual mild to moderate bilateral foraminal stenosis related to underlying listhesis, stable. IMPRESSION: 1. New findings consistent with acute osteomyelitis discitis about the T11-12 interspace. Associated epidural phlegmon/abscess extending from the visualized lower thoracic spine to approximately the level of T12-L1. Resultant mild spinal stenosis at the level of T11-12 without frank  cord compression. 2. 2.4 x 1.1 x 2.9 cm probable soft tissue abscess within the subcutaneous fat of the left back at the level of T12. 3. 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 4. Prior PLIF at L3 through S1 without residual or recurrent spinal stenosis. Electronically Signed   By: Jeannine Boga M.D.   On: 04/22/2022 20:08    Labs:  Basic Metabolic Panel: Recent Labs  Lab 05/13/22 0420  NA 134*  K 4.2  CL 96*  CO2 28  GLUCOSE 97  BUN 9  CREATININE 0.57*  CALCIUM 9.3    CBC: Recent Labs  Lab 05/11/22 0421 05/13/22 0420  WBC 10.7* 9.7  NEUTROABS 6.0  --   HGB 8.7* 9.2*  HCT 27.5* 29.1*  MCV 80.2 79.9*  PLT 477* 503*    CBG: No results for input(s): "GLUCAP" in the last 168 hours.  Brief HPI:   John Ware is a 56 y.o. male ***   Hospital Course: John Ware was admitted to rehab 05/08/2022 for inpatient therapies to consist of PT, ST and OT at least three hours five days a week. Past admission physiatrist, therapy team and rehab RN have worked together to provide customized collaborative inpatient rehab.   Blood pressures were monitored on TID basis and   Diabetes has been monitored with ac/hs CBG checks and SSI was use prn for tighter BS control.    Rehab course: During patient's stay in rehab weekly team conferences were held to monitor patient's progress, set goals and discuss barriers to discharge. At admission, patient required  He/She  has had improvement in activity tolerance, balance, postural control as well as ability to compensate for deficits. He/She has had improvement in functional use RUE/LUE  and RLE/LLE as well as improvement in awareness       Disposition:  There are no questions and answers to display.         Diet:  Special Instructions:  Discharge Instructions     [facility] pharmacist to adjust dose for Vancomycin,  Aminoglycosides and other anti-infective therapies as requested by physician.   Complete by: As directed    [facility] to provide Cath Flo 42m   Complete by:  As directed    Administer for PICC line occlusion and as ordered by physician for other access device issues.   Ambulatory referral to Physical Medicine Rehab   Complete by: As directed    Anaphylaxis Kit: Provided to treat any anaphylactic reaction to the medication being provided to the patient if First Dose or when requested by physician   Complete by: As directed    Epinephrine 12m/ml vial / amp: Administer 0.360m(0.34m234msubcutaneously once for moderate to severe anaphylaxis, nurse to call physician and pharmacy when reaction occurs and call 911 if needed for immediate care   Diphenhydramine 46m25m IV vial: Administer 25-46mg434mIM PRN for first dose reaction, rash, itching, mild reaction, nurse to call physician and pharmacy when reaction occurs   Sodium Chloride 0.9% NS 500ml 94mAdminister if needed for hypovolemic blood pressure drop or as ordered by physician after call to physician with anaphylactic reaction   Change dressing on IV access line weekly and PRN   Complete by: As directed    Flush IV access with Sodium Chloride 0.9% and Heparin 10 units/ml or 100 units/ml   Complete by: As directed    Home infusion instructions - [facility]   Complete by: As directed    Instructions: Flush IV access with Sodium Chloride 0.9% and Heparin 10units/ml or 100units/ml   Change dressing on IV access line: Weekly and PRN   Instructions Cath Flo 2mg: A17mnister for PICC Line occlusion and as ordered by physician for other access device   [facility] pharmacist to adjust dose for: Vancomycin, Aminoglycosides and other anti-infective therapies as requested by physician   Method of administration may be changed at the discretion of home infusion pharmacist based upon assessment of the patient and/or  caregiver's ability to self-administer the medication ordered   Complete by: As directed    Outpatient Parenteral Antibiotic Therapy Information Antibiotic: Cefepime (Maxipime) IVPB; Indications for use: back infection; End Date: 06/13/2022   Complete by: As directed    Antibiotic: Cefepime (Maxipime) IVPB   Indications for use: back infection   End Date: 06/13/2022      Allergies as of 05/17/2022       Reactions   Tizanidine Other (See Comments)   Leg cramps        Medication List     STOP taking these medications    morphine 15 MG 12 hr tablet Commonly known as: MS Contin       TAKE these medications    acetaminophen 500 MG tablet Commonly known as: TYLENOL Take 1 tablet (500 mg total) by mouth every 6 (six) hours as needed.   Baclofen 5 MG Tabs Take 1 tablet by mouth 3 (three) times daily.   ceFEPime  IVPB Commonly known as: MAXIPIME Inject 2 g into the vein every 8 (eight) hours. 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   doxycycline 100 MG tablet Commonly known as: VIBRA-TABS Take 1 tablet (100 mg total) by mouth 2 (two) times daily.   DULoxetine 60 MG capsule Commonly known as: CYMBALTA Take 1 capsule (60 mg total) by mouth at bedtime.   FreeStyle Libre 2Office Depotor Misc  CHECK BLOOD SUGAR 4 TIMES A DAY   gabapentin 400 MG capsule Commonly known as: NEURONTIN Take 1 capsule (400 mg total) by mouth 2 (two) times daily. What changed:  medication strength how much to take   hydrOXYzine 10 MG tablet Commonly known as: ATARAX Take 1 tablet (10 mg total) by mouth 3 (three) times daily.   methadone 5 MG  tablet Commonly known as: DOLOPHINE Take 3 tablets (15 mg total) by mouth every 12 (twelve) hours.   naloxone 0.4 MG/ML injection Commonly known as: NARCAN Inject 1 mL (0.4 mg total) into the vein as needed. What changed:  when to take this reasons to take this   oxyCODONE-acetaminophen 10-325 MG tablet Commonly known as: PERCOCET Take 1 tablet by mouth every 6 (six) hours as needed (breakthrough pain).               Discharge Care Instructions  (From admission, onward)           Start     Ordered   05/15/22 0000  Change dressing on IV access line weekly and PRN  (Home infusion instructions - [facility] )        05/15/22 1212            Follow-up Information     Kristeen Miss, MD Follow up.   Specialty: Neurosurgery Why: Call in 1-2 days for post hospital follow up Contact information: [address] [place] [address] [phone]         Courtney Heys, MD Follow up.   Specialty: Physical Medicine and Rehabilitation Why: office will call you with follow up appointment Contact information: [address] [place] [address] [phone]         Sharion Balloon, FNP Follow up.   Specialty: Family Medicine Why: Call in 1-2 days for post hospital follow up Contact information: [place] [address] [phone]         Margaretha Sheffield, MD Follow up.   Specialty: Physical Medicine and Rehabilitation Why: Call in 1-2 days for post hospital follow up and for refills on pain meds Contact information: [address], [place] [address] [phone]                 Signed: Bary Leriche 05/17/2022, 9:24 AM

## 2022-05-17 NOTE — Progress Notes (Signed)
Inpatient Rehabilitation Discharge Medication Review by a Pharmacist  A complete drug regimen review was completed for this patient to identify any potential clinically significant medication issues.  High Risk Drug Classes Is patient taking? Indication by Medication  Antipsychotic No   Anticoagulant No   Antibiotic Yes, as an intravenous medication Cefepime IV - osteomyelitis, end date 06/13/22 Doxycycline PO - osteomyelitis suppressive txt  Opioid Yes Methadone, Percocet - pain  Antiplatelet No   Hypoglycemics/insulin No   Vasoactive Medication No   Chemotherapy No   Other Yes Cymbalta, baclofen, gabapentin - pain Hydroxyzine - itching Naloxone PRN- opioid reversal     Type of Medication Issue Identified Description of Issue Recommendation(s)  Drug Interaction(s) (clinically significant)     Duplicate Therapy     Allergy     No Medication Administration End Date     Incorrect Dose     Additional Drug Therapy Needed     Significant med changes from prior encounter (inform family/care partners about these prior to discharge).    Other  PTA meds not resumed: Morphine PO switched to Methadone     Clinically significant medication issues were identified that warrant physician communication and completion of prescribed/recommended actions by midnight of the next day:  No  Name of provider notified for urgent issues identified:   Provider Method of Notification:   Pharmacist comments:   Time spent performing this drug regimen review (minutes):  Craig Beach, PharmD, BCPS 05/16/2022 11:39 AM

## 2022-05-17 NOTE — Progress Notes (Signed)
PROGRESS NOTE   Subjective/Complaints:  Reports his pain is under control. DC home today. No additional concerns.  LBM 9/21     05/17/2022    3:50 AM 05/16/2022    8:40 PM 05/16/2022    1:34 PM  Vitals with BMI  Systolic [id number]  Diastolic 76 73 78  Pulse 53 65 86     ROS: Denies fevers, chills, N/V, abdominal pain, constipation, diarrhea, HA, SOB, cough, chest pain,vision changes  Objective:   No results found. No results for input(s): "WBC", "HGB", "HCT", "PLT" in the last 72 hours.  No results for input(s): "NA", "K", "CL", "CO2", "GLUCOSE", "BUN", "CREATININE", "CALCIUM" in the last 72 hours.   Intake/Output Summary (Last 24 hours) at 05/17/2022 0812 Last data filed at 05/17/2022 0543 Gross per 24 hour  Intake 1616.6 ml  Output 1600 ml  Net 16.6 ml         Physical Exam: Vital Signs reviewed as above.   General: awake, alert, appropriate, in chair, NAD HENT: [place], AT, conjugate gaze; oropharynx moist CV: RRR Pulmonary: CTA B/L; no W/R/R- normal rate GI: soft, NT, ND, (+)BS Psychiatric: appropriate, pleasant Neurological: Ox3,follows commands   Musculoskeletal:     Comments: Left shoulder with atrophy and limited ROM. Biceps 3/5. LLE ataxic.  Otherwise with 5/5 strength. Insulin monitoring device on LUE Skin:    Comments: Back incision C/D/I with two fluctuant areas no drainage- no redness- appears slightly improved from prior  Buttocks- looks like healing heat rash Neurological:     Mental Status: He is alert and oriented to person, place, and time. Speech is fluent        Assessment/Plan: 1. Functional deficits which require 3+ hours per day of interdisciplinary therapy in a comprehensive inpatient rehab setting. Physiatrist is providing close team supervision and 24 hour management of active medical problems listed below. Physiatrist and rehab team continue to assess barriers to  discharge/monitor patient progress toward functional and medical goals  Care Tool:  Bathing    Body parts bathed by patient: Right arm, Left arm, Abdomen, Chest, Front perineal area, Left upper leg, Right upper leg, Face, Left lower leg, Right lower leg, Buttocks   Body parts bathed by helper: Buttocks     Bathing assist Assist Level: Supervision/Verbal cueing     Upper Body Dressing/Undressing Upper body dressing   What is the patient wearing?: Pull over shirt    Upper body assist Assist Level: Independent    Lower Body Dressing/Undressing Lower body dressing      What is the patient wearing?: Pants, Underwear/pull up     Lower body assist Assist for lower body dressing: Supervision/Verbal cueing     Toileting Toileting    Toileting assist Assist for toileting: Supervision/Verbal cueing Assistive Device Comment: urinal   Transfers Chair/bed transfer  Transfers assist     Chair/bed transfer assist level: Independent with assistive device Chair/bed transfer assistive device: Programmer, multimedia   Ambulation assist      Assist level: Independent with assistive device Assistive device: Walker-rolling Max distance: 150 ft   Walk 10 feet activity   Assist     Assist level: Independent  with assistive device Assistive device: Walker-rolling   Walk 50 feet activity   Assist Walk 50 feet with 2 turns activity did not occur: Safety/medical concerns  Assist level: Independent with assistive device Assistive device: Walker-rolling    Walk 150 feet activity   Assist Walk 150 feet activity did not occur: Safety/medical concerns  Assist level: Independent with assistive device Assistive device: Walker-rolling    Walk 10 feet on uneven surface  activity   Assist Walk 10 feet on uneven surfaces activity did not occur: Safety/medical concerns   Assist level: Independent with assistive device Assistive device: Walker-rolling    Wheelchair     Assist Is the patient using a wheelchair?: Yes Type of Wheelchair: Manual    Wheelchair assist level: Independent Max wheelchair distance: 500 ft    Wheelchair 50 feet with 2 turns activity    Assist        Assist Level: Independent   Wheelchair 150 feet activity     Assist      Assist Level: Independent   Blood pressure 117/76, pulse (!) 53, temperature 98.1 F (36.7 C), resp. rate 17, height 5\' 10"  (1.778 m), weight 71.8 kg, SpO2 96 %.  Assessment and plan: Medical Problem List and Plan: 1. 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  Antithrombotics: -DVT/anticoagulation:  Mechanical: Sequential compression devices, below knee Bilateral lower extremities             -antiplatelet therapy: N/A 3. Pain: continue prn oxycodone.              9/14- will change ms contin to methadone 10 mg TID- if cannot come to a decent improvement in pain, will need to switch back- if doesn't work, switch back to MS Contin but do 30 mg TID, instead of 45 mg BID.              9/15- pt reports pain still really bothering him- in at level SCI pain- at T10/11- but explained MS Contin won't help as much. Methadone will change in 4 days to 15 mg BID- can change back if need be, but would prefer BID since methadone increases half life over 7 days.               9/17 - Patient refusing therapies d/t poor pain control. Note methadone increase pending in AM; will hold off on further medication adjustments  9/18 Methadone up to 15mg  BID, continue to monitor response  9/22 pain under control with current medications, continue   4. Mood/Behavior/Sleep: LCSW to follow for evaluation and support.              -antipsychotic agents: N/A 5. Neuropsych/cognition: This patient is capable of making decisions  on his own behalf. 6. Skin/Wound Care: Monitor back incision for healing.  --Routine pressure relief measures.  - 9/16 - appears stable. Per neurosurgery no acute intervention, likely resolve with time.  CBC stable WBC WNL 9/18  7. Fluids/Electrolytes/Nutrition: Monitor I/O. Wife to bring food from home as does not like food her.  --Check CMET in am.  8. MRSA bacteremia: Continue Cefepime for 6 weeks w/end date 06/13/22             --  lifelong suppression w/doxycycline and cipro?  9. T2DM: Hgb A1c--7.1. Will monitor BS ac/hs- has libre and does not want finger stuck-.  Did get couple of doses of steroids with BS labile. --continue Levemir and SSI has been refused by patient-->people die in -hospital. -=He was able to get A1C down to 7 by diet and plans to continue that. ---will continue to educate on importance of BS control.              9/14- BG control decent- con't regimen and monitoring 9/18 glucose 97 on BMP, indicating good control 9/20 discussed benefits of carb modified diet, per pt request will change to regular diet -Pt reports his glucose has been well controlled 10. Leucocytosis: WBC has been ranging from 10-11 range. Repeat WBC tomorrow             9/14- WBC 13k- hasn't been checked in 11 days- will recheck again in am to see if going up or down.              9/15- WBC down to 11.6k- so will con't to monitor weekly.   9/18 WBC down to 9.7 11. Constipation?: No BM documented  --per patient bowels have been working and feels that NO laxative needed.  \             9/14- says LBM last night- refuses bowel meds.              LBM 9/21, improved 12. Itching             9/14- asked for atarax to be scheduled q8 hours.  13. At level SCI pain             9/15- start Cymbalta 30 mg QHS x 5 days then 60 mg QHS.              9/16, 9/17 - stable  9/19 Increase Cymbalta dose to 60mg  BID Improved 14. Diastolic HF             [id number] - received notification from nursing that family was  reviewing an informaiton packet in the patient's room and was upset to find literature on managing heart failure. Family was not aware patient carried this diagnosis. On chart review, was mentioned on admission that patient had "chronic diastolic heart failure" based on an echo from 05/2021, which showed grade 1 dysfunction. Printed these results and discussed with patient, recommended PCP f/u at DC as he remains asymptomatic.  15. Anemia.  -HGB improved to 9.2 on 9/18, follow  LOS: 9 days A FACE TO [place] 05/17/2022, 8:12 AM    **addendum** r

## 2022-05-17 NOTE — Progress Notes (Signed)
Inpatient Rehabilitation Care Coordinator Discharge Note   Patient Details  Name: John Ware MRN: [id number] Date of Birth: Jun 22, 1966   Discharge location: D/c to home  Length of Stay: 8 days  Discharge activity level: wheelchair level Modified Independent  Home/community participation: limited  Patient response IW:[id number] Literacy - How often do you need to have someone help you when you read instructions, pamphlets, or other written material from your doctor or pharmacy?: Never  Patient response JJ:HERDEY Isolation - How often do you feel lonely or isolated from those around you?: Never  Services provided included: MD, RD, PT, OT, RN, CM, TR, Pharmacy, Neuropsych, SW  Financial Services:  Charity fundraiser Utilized: [employer] Medicare  Choices offered to/list presented to: Yes  Follow-up services arranged:  Home Health, Other (Comment) [place]: [facility] for HHPT/OT/SN(IV abx); [facility] for IV abx until 10/19      Patient response to transportation need: Is the patient able to respond to transportation needs?: Yes In the past 12 months, has lack of transportation kept you from medical appointments or from getting medications?: No In the past 12 months, has lack of transportation kept you from meetings, work, or from getting things needed for daily living?: No   Comments (or additional information):  Patient/Family verbalized understanding of follow-up arrangements:  Yes  Individual responsible for coordination of the follow-up plan: contact pt or pt wife  Confirmed correct DME delivered: Rana Snare 05/17/2022    Rana Snare

## 2022-05-23 ENCOUNTER — Other Ambulatory Visit (HOSPITAL_COMMUNITY): Payer: Self-pay

## 2022-05-23 ENCOUNTER — Telehealth: Payer: Self-pay | Admitting: *Deleted

## 2022-05-23 MED ORDER — OXYCODONE-ACETAMINOPHEN 10-325 MG PO TABS
1.0000 | ORAL_TABLET | Freq: Four times a day (QID) | ORAL | 0 refills | Status: DC
  Filled 2022-05-24 – 2022-07-24 (×2): qty 28, 7d supply, fill #0

## 2022-05-23 NOTE — Telephone Encounter (Signed)
[facility] called requesting the record of John Ware when he left the hospital so they will know what he was getting and how to adjust their prescribing. I have called them back and advised them to call [facility] [phone].

## 2022-05-24 ENCOUNTER — Other Ambulatory Visit (HOSPITAL_COMMUNITY): Payer: Self-pay

## 2022-05-27 ENCOUNTER — Encounter
Payer: Commercial Managed Care - PPO | Attending: Physical Medicine and Rehabilitation | Admitting: Physical Medicine and Rehabilitation

## 2022-05-27 ENCOUNTER — Other Ambulatory Visit (HOSPITAL_COMMUNITY): Payer: Self-pay

## 2022-05-29 ENCOUNTER — Ambulatory Visit: Payer: Commercial Managed Care - PPO | Admitting: Infectious Diseases

## 2022-06-03 ENCOUNTER — Ambulatory Visit: Payer: Self-pay | Admitting: Internal Medicine

## 2022-06-18 ENCOUNTER — Telehealth: Payer: Self-pay | Admitting: Family

## 2022-06-21 ENCOUNTER — Ambulatory Visit: Payer: Commercial Managed Care - PPO | Admitting: Infectious Diseases

## 2022-06-28 ENCOUNTER — Ambulatory Visit: Payer: Commercial Managed Care - PPO | Admitting: Infectious Diseases

## 2022-07-02 ENCOUNTER — Ambulatory Visit: Payer: Commercial Managed Care - PPO | Admitting: Family

## 2022-07-03 ENCOUNTER — Other Ambulatory Visit: Payer: Self-pay

## 2022-07-03 ENCOUNTER — Encounter: Payer: Self-pay | Admitting: Infectious Diseases

## 2022-07-03 ENCOUNTER — Ambulatory Visit (INDEPENDENT_AMBULATORY_CARE_PROVIDER_SITE_OTHER): Payer: Commercial Managed Care - PPO | Admitting: Infectious Diseases

## 2022-07-03 VITALS — BP 106/65 | HR 72 | Temp 97.6°F | Ht 70.0 in | Wt 164.0 lb

## 2022-07-03 DIAGNOSIS — A498 Other bacterial infections of unspecified site: Secondary | ICD-10-CM

## 2022-07-03 DIAGNOSIS — R21 Rash and other nonspecific skin eruption: Secondary | ICD-10-CM | POA: Diagnosis not present

## 2022-07-03 DIAGNOSIS — M4624 Osteomyelitis of vertebra, thoracic region: Secondary | ICD-10-CM | POA: Diagnosis not present

## 2022-07-03 DIAGNOSIS — A4901 Methicillin susceptible Staphylococcus aureus infection, unspecified site: Secondary | ICD-10-CM

## 2022-07-03 DIAGNOSIS — Z5181 Encounter for therapeutic drug level monitoring: Secondary | ICD-10-CM | POA: Diagnosis not present

## 2022-07-03 DIAGNOSIS — A4902 Methicillin resistant Staphylococcus aureus infection, unspecified site: Secondary | ICD-10-CM

## 2022-07-03 MED ORDER — PERMETHRIN 5 % EX CREA: 1.0000 | TOPICAL_CREAM | CUTANEOUS | 0 refills | Status: DC

## 2022-07-03 MED ORDER — DOXYCYCLINE HYCLATE 100 MG PO TABS: 100.0000 mg | ORAL_TABLET | Freq: Two times a day (BID) | ORAL | 11 refills | Status: DC

## 2022-07-03 MED ORDER — CIPROFLOXACIN HCL 250 MG PO TABS: 500.0000 mg | ORAL_TABLET | Freq: Two times a day (BID) | ORAL | 2 refills | Status: DC

## 2022-07-03 MED ORDER — CIPROFLOXACIN HCL 500 MG PO TABS: 500.0000 mg | ORAL_TABLET | Freq: Two times a day (BID) | ORAL | 2 refills | Status: DC

## 2022-07-03 NOTE — Progress Notes (Unsigned)
Patient Active Problem List   Diagnosis Date Noted   History of depression    Osteomyelitis of lumbar spine ([place]) 05/08/2022   Midline low back pain    Numbness    Osteomyelitis (HCC)    Chronic diastolic CHF (congestive heart failure) ([place]) 04/23/2022   Chronic pain 04/23/2022   Osteomyelitis of vertebra of thoracolumbar region ([place]) 04/22/2022   Presence of retained hardware 10/17/2021   Infective myositis 08/29/2021   Discitis of thoracolumbar region 07/31/2021   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   MRSA infection    Diabetic peripheral neuropathy ([place])    Septic discitis of lumbar region 06/11/2021   Infection of spine ([place]) 05/28/2021   Medication monitoring encounter 05/09/2021   MSSA (methicillin susceptible Staphylococcus aureus) infection 05/09/2021   History of COVID-19    Psoas abscess (HCC)    Psoas muscle abscess ([place]) 02/15/2021   Right shoulder pain 02/14/2021   Chest wall abscess 02/14/2021   Normocytic anemia 02/14/2021   Thrombocytosis 02/14/2021   Endocarditis 02/05/2021   Vision changes 02/04/2021   Ascending aorta dilatation ([place]) 02/03/2021   Pressure injury of skin 02/01/2021   HTN (hypertension) 02/01/2021   Anxiety 02/01/2021   Left shoulder pain 02/01/2021   Hyponatremia 01/31/2021   Positive colorectal cancer screening using Cologuard test 12/28/2020   DOE (dyspnea on exertion) 10/20/2020   Diabetic polyneuropathy associated with type 2 diabetes mellitus ([place]) 10/09/2020   DM2 (diabetes mellitus, type 2) ([place]) 10/30/2018   Lumbar stenosis with neurogenic claudication 10/14/2017   DDD (degenerative disc disease), lumbar 02/17/2017   Transient weakness of right lower extremity 02/05/2017   Hyperlipidemia associated with type 2 diabetes mellitus ([place]) 10/24/2016   Depression, recurrent ([place]) 10/24/2016   Right knee pain 05/21/2016   DDD (degenerative disc disease), lumbosacral 05/21/2016   Herniation of nucleus pulposus of cervical intervertebral  disc without myelopathy 02/29/2016   Carpal tunnel syndrome 02/08/2016   Cervical spondylosis with myelopathy 01/17/2016   Lumbar stenosis 12/13/2014    Patient's Medications  New Prescriptions   No medications on file  Previous Medications   ACETAMINOPHEN (TYLENOL) 500 MG TABLET    Take 1 tablet (500 mg total) by mouth every 6 (six) hours as needed.   BACLOFEN 5 MG TABS    Take 1 tablet by mouth 3 (three) times daily.   CONTINUOUS BLOOD GLUC SENSOR (FREESTYLE LIBRE 2 SENSOR) MISC    CHECK BLOOD SUGAR 4 TIMES A DAY   DOXYCYCLINE (VIBRA-TABS) 100 MG TABLET    Take 1 tablet (100 mg total) by mouth 2 (two) times daily.   DULOXETINE (CYMBALTA) 60 MG CAPSULE    Take 1 capsule (60 mg total) by mouth at bedtime.   GABAPENTIN (NEURONTIN) 400 MG CAPSULE    Take 1 capsule (400 mg total) by mouth 2 (two) times daily.   HYDROXYZINE (ATARAX) 10 MG TABLET    Take 1 tablet (10 mg total) by mouth 3 (three) times daily.   METHADONE (DOLOPHINE) 5 MG TABLET    Take 3 tablets (15 mg total) by mouth every 12 (twelve) hours.   NALOXONE (NARCAN) 0.4 MG/ML INJECTION    Inject 1 mL (0.4 mg total) into the vein as needed.   OXYCODONE-ACETAMINOPHEN (PERCOCET) 10-325 MG TABLET    Take 1 tablet by mouth every 6 (six) hours as needed (breakthrough pain).   OXYCODONE-ACETAMINOPHEN (PERCOCET) 10-325 MG TABLET    Take 1 tablet by mouth every 6 (six) hours. Stop taking methadone.  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   Admitted on 04/22/2022 with inability to walk, BLE numbness, intermittent fevers and urinary incontinence x3 weeks.  He was found to have acute discitis T9/10-T11/T12 with epidural phlegmon/abscess, septic arthritis T11-T12, extensive enhancement paraspinal soft tissue, as well as soft tissue abscess. 8/30 S/p fluoroscopy guided T1-11 disc aspiration ( Cx PsA), 8/31 aspiration of left back superficial abscess ( Cx PsA). Blood cx 8/28 1/2 sets staph pseudintermedius/epidermidis. This was followed by I&D of subcut abscess with decompression and debridement of osteomyelitis via T10-T11 laminectomy on 09/07 by Dr. Ellene Route ( OR cx NG). Seen by ID and antibiotics switched to cefepime and planned for 6 weeks course EOT 10/19 with plan to continue Ciprofloxacin thereafter. Patient was also continued on doxycycline for hardware associated lumbar osteomyelitis. Patient was discharged to CIR due to functional decline on 9/12 and then discharged home from rehab on 9/22.   Today's visit  Patient is accompanied by his wife. He has completed course of IV cefepime on 10/19, however he has not been started on ciprofloxacin till date. He has been taking doxycycline except missing 1/2 doses sometimes. Denies any issues with abtx. Back pain has significantly resolved. He is however still weak and cannot walk independently. He feels wobbly and has balance issues when he tries to walk. He needs a walker to walk. I discussed with him to possibly start ciprofloxacin as his inflammatory markers have uptrended and would like to continue for sometime, preferably until normalization of ESR and CRP and plan to stop thereafter. Discussed with him to check EKG about need to monitor QTC to which was  initially resistant but upon for explaining the reason, he agreed to do EKG and continue ciprofloxacin   He is also asking about rashes which is diffuse and generalized in his body. Thinks it started some time before hospitalization when he thought he had poison oak. He was given hydroxyzine while in the hospital and rashes improved but then recurred back. One of the hospital notes also mentions concerns for rash related to morphine. Wife also reports daughter had scabies while he was in the hospital. He also has itching. Denies fevers, chills, sweats. Denies oral ulcers, rashes in palms or soles. Denies joint pain/GI and GI symptoms   Review of Systems: all systems reviewed with pertinent positives and negatives as listed above   Past Medical History:  Diagnosis Date   Depression    Diabetes mellitus without complication ([place])    Hypertension    Spinal stenosis    With Neurogenic Claudication   Past Surgical History:  Procedure Laterality Date   CARPAL TUNNEL RELEASE Bilateral    INCISION AND DRAINAGE ABSCESS Left 03/12/2021   Procedure: INCISION AND DRAINAGE ABSCESS;  Surgeon: Marchia Bond, MD;  Location: [facility];  Service: Orthopedics;  Laterality: Left;   IR FLUORO GUIDED NEEDLE PLC ASPIRATION/INJECTION LOC  04/24/2022   IR US GUIDE BX ASP/DRAIN  02/02/2021   IRRIGATION AND DEBRIDEMENT ABSCESS Left 02/08/2021   Procedure: MINOR INCISION AND DRAINAGE OF ABSCESS;  Surgeon: Marchia Bond, MD;  [facility];  Service: Orthopedics;  Laterality: Left;   IRRIGATION AND DEBRIDEMENT ABSCESS Left 02/10/2021   Procedure: IRRIGATION AND DEBRIDEMENT ABSCESS LEFT LATERAL CHEST WALL;  Surgeon: Greer Pickerel, MD;  [facility];  Service: General;  Laterality: Left;   IRRIGATION AND DEBRIDEMENT ABSCESS Bilateral 02/20/2021   Procedure: MINOR INCISION AND DRAINAGE OF ABSCESS/WITH BILATERAL WOUND VAC PLACEMENT;  Surgeon: Marchia Bond, MD;  [facility];  Service: Orthopedics;  Laterality:  Bilateral;   IRRIGATION AND DEBRIDEMENT SHOULDER Bilateral 02/28/2021   Procedure: IRRIGATION AND DEBRIDEMENT SHOULDER;  Surgeon: Hiram Gash, MD;  [facility];  Service: Orthopedics;  Laterality: Bilateral;   IRRIGATION AND DEBRIDEMENT SHOULDER Left 03/06/2021   Procedure: IRRIGATION AND DEBRIDEMENT SHOULDER;  Surgeon: Renette Butters, MD;  [facility];  Service: Orthopedics;  Laterality: Left;   IRRIGATION AND DEBRIDEMENT SHOULDER Left 03/08/2021   Procedure: IRRIGATION AND DEBRIDEMENT SHOULDER;  Surgeon: Marchia Bond, MD;  [facility];  Service: Orthopedics;  Laterality: Left;   LUMBAR LAMINECTOMY FOR EPIDURAL ABSCESS N/A 05/02/2022   Procedure: THORACIC  LAMINECTOMY AND DEBRIDEMENT  FOR EPIDURAL ABSCESS;  Surgeon: Kristeen Miss, MD;  Location: [facility];  Service: Neurosurgery;  Laterality: N/A;   LUMBAR LAMINECTOMY/DECOMPRESSION MICRODISCECTOMY N/A 12/13/2014   Procedure: LUMBAR THREE-FOUR, LUMBAR FOUR-FIVE, LUMBAR FIVE-SACRAL ONE LAMINECTOMY;  Surgeon: Kristeen Miss, MD;  Location: [facility];  Service: Neurosurgery;  Laterality: N/A;  L3-4 L4-5 L5-S1 Laminectomy   LUMBAR LAMINECTOMY/DECOMPRESSION MICRODISCECTOMY Left 06/05/2021   Procedure: Left lumbar one-two, Lumbar two-three Laminectomy;  Surgeon: Kristeen Miss, MD;  Location: [facility];  Service: Neurosurgery;  Laterality: Left;   MENISCUS REPAIR     Right knee   MINOR APPLICATION OF WOUND VAC Left 02/20/2021   Procedure: MINOR APPLICATION OF KERLIX PACKING/REMOVAL OF WOUND VAC;  Surgeon: Johnathan Hausen, MD;  [facility];  Service: General;  Laterality: Left;   TEE WITHOUT CARDIOVERSION N/A 02/05/2021   Procedure: TRANSESOPHAGEAL ECHOCARDIOGRAM (TEE);  Surgeon: Sueanne Margarita, MD;  Location[facility];  Service: Cardiovascular;  Laterality: N/A;   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   TOTAL SHOULDER ARTHROPLASTY Right 02/10/2021   Procedure: RIGHT OPEN SHOULDER I & D, OPEN DISTAL CLAVICLE RESECTION;   Surgeon: Marchia Bond, MD;  [facility];  Service: Orthopedics;  Laterality: Right;   VASECTOMY       Social History   Tobacco Use   Smoking status: Former    Packs/day: 2.00    Years: 15.00    Total pack years: 30.00    Types: Cigarettes    Quit date: 2012    Years since quitting: 11.8   Smokeless tobacco: Never  Vaping Use   Vaping Use: Never used  Substance Use Topics   Alcohol use: Yes    Comment: occasional   Drug use: No    Family History  Problem Relation Age of Onset   Cancer Mother    Colon cancer Neg Hx     Allergies  Allergen Reactions   Tizanidine Other (See Comments)    Leg cramps    Health Maintenance  Topic Date Due   Zoster Vaccines- Shingrix (1 of 2) Never done   COVID-19 Vaccine (3 - Moderna series) 03/13/2020   Lung Cancer Screening  02/26/2022  INFLUENZA VACCINE  03/26/2022   Diabetic kidney evaluation - Urine ACR  04/23/2022   HEMOGLOBIN A1C  11/01/2022   FOOT EXAM  11/17/2022   OPHTHALMOLOGY EXAM  11/17/2022   Diabetic kidney evaluation - GFR measurement  05/14/2023   Fecal DNA (Cologuard)  11/03/2023   TETANUS/TDAP  08/14/2024   Hepatitis C Screening  Completed   HIV Screening  Completed   HPV VACCINES  Aged Out    Objective: BP 106/65   Pulse 72   Temp 97.6 F (36.4 C) (Oral)   Ht _0  (1.778 m)   Wt 164 lb (74.4 kg)   BMI 23.53 kg/m   Physical Exam Constitutional:  older than stated age, extremely malnourished   HENT:     Head: Normocephalic and atraumatic.      Mouth: Mucous membranes are moist.  Eyes:    Conjunctiva/sclera: Conjunctivae normal.     Pupils:   Cardiovascular:     Rate and Rhythm: Normal rate and regular rhythm.     Heart sounds:  Pulmonary:     Effort: Pulmonary effort is normal.     Breath sounds: Normal breath sounds.   Abdominal:     General: Non distended     Palpations: soft.   Musculoskeletal:        General: in wheel chair, has a cane. Power of Rt LE 4+/5 Power of Lt LE  4/5  Skin:    General: Skin is warm and dry.     Comments: generalized chronic appearing maculopapular rashes in his torso, upper and lower extremities with excoriations            Neurological:     General: grossly non focal     Mental Status: awake, alert and oriented to person, place, and time.   Psychiatric:        Mood and Affect: Mood normal.   Lab Results Lab Results  Component Value Date   WBC 9.7 05/13/2022   HGB 9.2 (L) 05/13/2022   HCT 29.1 (L) 05/13/2022   MCV 79.9 (L) 05/13/2022   PLT 503 (H) 05/13/2022    Lab Results  Component Value Date   CREATININE 0.57 (L) 05/13/2022   BUN 9 05/13/2022   NA 134 (L) 05/13/2022   K 4.2 05/13/2022   CL 96 (L) 05/13/2022   CO2 28 05/13/2022    Lab Results  Component Value Date   ALT 18 05/09/2022   AST 14 (L) 05/09/2022   ALKPHOS 92 05/09/2022   BILITOT 0.5 05/09/2022    Lab Results  Component Value Date   CHOL 146 11/08/2021   HDL 43 11/08/2021   LDLCALC 85 11/08/2021   TRIG 97 11/08/2021   CHOLHDL 3.4 11/08/2021   No results found for: "LABRPR", "RPRTITER" No results found for: "HIV1RNAQUANT", "HIV1RNAVL", "CD4TABS"   Microbiology  Results for orders placed or performed during the hospital encounter of 04/22/22  Blood culture (routine x 2)     Status: None   Collection Time: 04/22/22  2:52 PM   Specimen: BLOOD  Result Value Ref Range Status   Specimen Description BLOOD SITE NOT SPECIFIED  Final   Special Requests   Final    BOTTLES DRAWN AEROBIC AND ANAEROBIC Blood Culture adequate volume   Culture   Final    NO GROWTH 5 DAYS Performed at [facility], [address] [place], [place] [address]    Report Status 04/27/2022 FINAL  Final  Blood culture (routine x 2)  Status: Abnormal   Collection Time: 04/22/22  7:24 PM   Specimen: BLOOD  Result Value Ref Range Status   Specimen Description BLOOD LEFT ANTECUBITAL  Final   Special Requests   Final    BOTTLES DRAWN AEROBIC AND  ANAEROBIC Blood Culture results may not be optimal due to an excessive volume of blood received in culture bottles   Culture  Setup Time   Final    GRAM POSITIVE COCCI IN CLUSTERS IN BOTH AEROBIC AND ANAEROBIC BOTTLES CRITICAL RESULT CALLED TO, READ BACK BY AND VERIFIED WITH: PHARMD LISA [place] ON 04/23/22 @ 1951 BY DRT    Culture (A)  Final    STAPHYLOCOCCUS PSEUDINTERMEDIUS STAPHYLOCOCCUS EPIDERMIDIS THE SIGNIFICANCE OF ISOLATING THIS ORGANISM FROM A SINGLE SET OF BLOOD CULTURES WHEN MULTIPLE SETS ARE DRAWN IS UNCERTAIN. PLEASE NOTIFY THE MICROBIOLOGY DEPARTMENT WITHIN ONE WEEK IF SPECIATION AND SENSITIVITIES ARE REQUIRED. Performed at [facility], [place] [address], [place], [place] [address]    Report Status 04/25/2022 FINAL  Final  Blood Culture ID Panel (Reflexed)     Status: Abnormal   Collection Time: 04/22/22  7:24 PM  Result Value Ref Range Status   Enterococcus faecalis NOT DETECTED NOT DETECTED Final   Enterococcus Faecium NOT DETECTED NOT DETECTED Final   Listeria monocytogenes NOT DETECTED NOT DETECTED Final   Staphylococcus species DETECTED (A) NOT DETECTED Final    Comment: CRITICAL RESULT CALLED TO, READ BACK BY AND VERIFIED WITH: PHARMD LISA CURRAN ON 04/23/22 @ 1951 BY DRT    Staphylococcus aureus (BCID) NOT DETECTED NOT DETECTED Final   Staphylococcus epidermidis DETECTED (A) NOT DETECTED Final    Comment: Methicillin (oxacillin) resistant coagulase negative staphylococcus. Possible blood culture contaminant (unless isolated from more than one blood culture draw or clinical case suggests pathogenicity). No antibiotic treatment is indicated for blood  culture contaminants. CRITICAL RESULT CALLED TO, READ BACK BY AND VERIFIED WITH: PHARMD LISA CURRAN ON 04/23/22 @ 1951 BY DRT    Staphylococcus lugdunensis NOT DETECTED NOT DETECTED Final   Streptococcus species NOT DETECTED NOT DETECTED Final   Streptococcus agalactiae NOT DETECTED NOT DETECTED Final   Streptococcus  pneumoniae NOT DETECTED NOT DETECTED Final   Streptococcus pyogenes NOT DETECTED NOT DETECTED Final   A.calcoaceticus-baumannii NOT DETECTED NOT DETECTED Final   Bacteroides fragilis NOT DETECTED NOT DETECTED Final   Enterobacterales NOT DETECTED NOT DETECTED Final   Enterobacter cloacae complex NOT DETECTED NOT DETECTED Final   Escherichia coli NOT DETECTED NOT DETECTED Final   Klebsiella aerogenes NOT DETECTED NOT DETECTED Final   Klebsiella oxytoca NOT DETECTED NOT DETECTED Final   Klebsiella pneumoniae NOT DETECTED NOT DETECTED Final   Proteus species NOT DETECTED NOT DETECTED Final   Salmonella species NOT DETECTED NOT DETECTED Final   Serratia marcescens NOT DETECTED NOT DETECTED Final   Haemophilus influenzae NOT DETECTED NOT DETECTED Final   Neisseria meningitidis NOT DETECTED NOT DETECTED Final   Pseudomonas aeruginosa NOT DETECTED NOT DETECTED Final   Stenotrophomonas maltophilia NOT DETECTED NOT DETECTED Final   Candida albicans NOT DETECTED NOT DETECTED Final   Candida auris NOT DETECTED NOT DETECTED Final   Candida glabrata NOT DETECTED NOT DETECTED Final   Candida krusei NOT DETECTED NOT DETECTED Final   Candida parapsilosis NOT DETECTED NOT DETECTED Final   Candida tropicalis NOT DETECTED NOT DETECTED Final   Cryptococcus neoformans/gattii NOT DETECTED NOT DETECTED Final   Methicillin resistance mecA/C DETECTED (A) NOT DETECTED Final    Comment: CRITICAL RESULT CALLED TO, READ  BACK BY AND VERIFIED WITH: PHARMD LISA CURRAN ON 04/23/22 @ 1951 BY DRT Performed at [facility], [place] [address], [place], [place] [address]   Body fluid culture w Gram Stain     Status: Abnormal   Collection Time: 04/24/22 11:37 AM   Specimen: PATH Disc; Body Fluid  Result Value Ref Range Status   Specimen Description FLUID  Final   Special Requests  DISC  Final   Gram Stain   Final    RARE WBC PRESENT, PREDOMINANTLY PMN RARE GRAM VARIABLE ROD Performed at [facility] [address], [place], [place] [address]    Culture PSEUDOMONAS AERUGINOSA (A)  Final   Report Status 04/26/2022 FINAL  Final   Organism ID, Bacteria PSEUDOMONAS AERUGINOSA  Final      Susceptibility   Pseudomonas aeruginosa - MIC*    CEFTAZIDIME 4 SENSITIVE Sensitive     CIPROFLOXACIN <=0.25 SENSITIVE Sensitive     GENTAMICIN <=1 SENSITIVE Sensitive     IMIPENEM 1 SENSITIVE Sensitive     PIP/TAZO 8 SENSITIVE Sensitive     CEFEPIME 2 SENSITIVE Sensitive     * PSEUDOMONAS AERUGINOSA  Aerobic/Anaerobic Culture w Gram Stain (surgical/deep wound)     Status: None   Collection Time: 04/25/22 12:10 PM   Specimen: Abscess  Result Value Ref Range Status   Specimen Description ABSCESS  Final   Special Requests NONE  Final   Gram Stain   Final    FEW WBC PRESENT, PREDOMINANTLY PMN NO ORGANISMS SEEN    Culture   Final    FEW PSEUDOMONAS AERUGINOSA NO ANAEROBES ISOLATED Performed at [facility], [place] [address], [place], [place] [address]    Report Status 04/30/2022 FINAL  Final   Organism ID, Bacteria PSEUDOMONAS AERUGINOSA  Final      Susceptibility   Pseudomonas aeruginosa - MIC*    CEFTAZIDIME 4 SENSITIVE Sensitive     CIPROFLOXACIN <=0.25 SENSITIVE Sensitive     GENTAMICIN <=1 SENSITIVE Sensitive     IMIPENEM 1 SENSITIVE Sensitive     PIP/TAZO 8 SENSITIVE Sensitive     CEFEPIME 2 SENSITIVE Sensitive     * FEW PSEUDOMONAS AERUGINOSA  Surgical pcr screen     Status: None   Collection Time: 05/02/22 10:33 AM   Specimen: Nasal Mucosa; Nasal Swab  Result Value Ref Range Status   MRSA, PCR NEGATIVE NEGATIVE Final   Staphylococcus aureus NEGATIVE NEGATIVE Final    Comment: (NOTE) The Xpert SA Assay (FDA approved for NASAL specimens in patients 67 years of age and older), is one component of a comprehensive surveillance program. It is not intended to diagnose infection nor to guide or monitor treatment. Performed at [facility], [place] [address], [place],  [place] [address]   Aerobic/Anaerobic Culture w Gram Stain (surgical/deep wound)     Status: None   Collection Time: 05/02/22  4:41 PM   Specimen: PATH Soft tissue  Result Value Ref Range Status   Specimen Description TISSUE  Final   Special Requests SQ TISSUE  Final   Gram Stain NO WBC SEEN NO ORGANISMS SEEN   Final   Culture   Final    No growth aerobically or anaerobically. Performed at [facility], [place] [address], [place], [place] [address]    Report Status 05/07/2022 FINAL  Final  Aerobic/Anaerobic Culture w Gram Stain (surgical/deep wound)     Status: None   Collection Time: 05/02/22  5:18 PM  Specimen: PATH Soft tissue  Result Value Ref Range Status   Specimen Description TISSUE  Final   Special Requests THORACIC 9 AND 10 LAMINECTOMY  Final   Gram Stain NO WBC SEEN NO ORGANISMS SEEN   Final   Culture   Final    No growth aerobically or anaerobically. Performed at [facility], [place] [address], [place], [place] [address]    Report Status 05/07/2022 FINAL  Final   Imaging No results found.  Assessment/Plan  # T9-T12 osteomyelitis  # left T11-T12 septic arthritis # epidural phlegmon/abscess extending from t9-t12 resulting in severe T10-T11 severe spinal canal stenosis Completed IV cefepime on 10/19, has not been on ciprofloxacin since then ( has missed several appts) Will start ciprofloxacin 749m po bid given elevated inflammatory markers  EKG 11/8 qtc 410  Discussed common side effects of fluoroquinolones like prolongation of qtc, nausea, vomiting, diarrhea, C diff , alteration of blood glucose, aortic aneurysm/dissection etc. 05/2021 Echo with normal Aortic root size. He was initially hesistant to take 2 abtx as well as need for EKG to check for qtc. However, upon discussion of risks vs benefits and plan to closely monitor, he agreed to ciprofloxacin. Advised to not take ciprofloxacin with divalent cations.   Will plan to eventually stop ciprofloxacin if  continues to do well clinically and improvement in inflammatory markers. He does not have a hardware placed in T spine in last OR.   Fu in a month.   # Generalized maculopapular rashes - Unclear etiology, does not appear to be abtx related. He had similar rashes while in the hospital that seemed to have resolved on hydroxyzine - Given recent h/o daughter with scabies, and some excoriations noted in toe web space, will plan to treat empirically for scabies - Amb referral to Dermatology    # Medication Monitoring  10/2  ESR 59, CRP 17  10/16 ESR 42, CRP 20 10/24 ESR 67, CRP 66  # Lumbar Osteomyelitis complicated with hardware Continue po doxycycline indefinitely  I have personally spent more than 70 minutes involved in face-to-face and non-face-to-face activities for this patient on the day of the visit. Professional time spent includes the following activities: Preparing to see the patient (review of tests), Obtaining and/or reviewing separately obtained history (admission/discharge record), Performing a medically appropriate examination and/or evaluation , Ordering medications/tests/procedures, referring and communicating with other health care professionals, Documenting clinical information in the EMR, Independently interpreting results (not separately reported), Communicating results to the patient/family/caregiver, Counseling and educating the patient/family/caregiver and Care coordination (not separately reported).   SWilber Oliphant MBendena[facility]ious [facility] 07/03/2022, 1:30 PM

## 2022-07-03 NOTE — Progress Notes (Deleted)
10/2  ESR 59, CRP 17  10/16 ESR 42, CRP 20 10/24 ESR 67, CRP 66   Rashes started some time before hospitalization when he thought he had poison oak. He was given some topical steroids in the hospital and improved but recurred back. Wife also reports daughter had scabies while he was in the hospital. Back pain has significantly improved. No fevers, chills

## 2022-07-04 LAB — CBC
HCT: 36.1 % — ABNORMAL LOW (ref 38.5–50.0)
Hemoglobin: 11.9 g/dL — ABNORMAL LOW (ref 13.2–17.1)
MCH: 26.2 pg — ABNORMAL LOW (ref 27.0–33.0)
MCHC: 33 g/dL (ref 32.0–36.0)
MCV: 79.5 fL — ABNORMAL LOW (ref 80.0–100.0)
MPV: 9.4 fL (ref 7.5–12.5)
Platelets: 422 10*3/uL — ABNORMAL HIGH (ref 140–400)
RBC: 4.54 10*6/uL (ref 4.20–5.80)
RDW: 16.8 % — ABNORMAL HIGH (ref 11.0–15.0)
WBC: 8.8 10*3/uL (ref 3.8–10.8)

## 2022-07-04 LAB — BASIC METABOLIC PANEL
BUN/Creatinine Ratio: 14 (calc) (ref 6–22)
BUN: 8 mg/dL (ref 7–25)
CO2: 31 mmol/L (ref 20–32)
Calcium: 9.8 mg/dL (ref 8.6–10.3)
Chloride: 102 mmol/L (ref 98–110)
Creat: 0.58 mg/dL — ABNORMAL LOW (ref 0.70–1.30)
Glucose, Bld: 175 mg/dL — ABNORMAL HIGH (ref 65–99)
Potassium: 4.5 mmol/L (ref 3.5–5.3)
Sodium: 141 mmol/L (ref 135–146)

## 2022-07-04 LAB — C-REACTIVE PROTEIN: CRP: 15.5 mg/L — ABNORMAL HIGH (ref <8.0)

## 2022-07-04 LAB — SEDIMENTATION RATE: Sed Rate: 33 mm/h — ABNORMAL HIGH (ref 0–20)

## 2022-07-07 DIAGNOSIS — A498 Other bacterial infections of unspecified site: Secondary | ICD-10-CM | POA: Insufficient documentation

## 2022-07-24 ENCOUNTER — Other Ambulatory Visit (HOSPITAL_COMMUNITY): Payer: Self-pay

## 2022-07-24 MED ORDER — OXYCODONE-ACETAMINOPHEN 10-325 MG PO TABS
1.0000 | ORAL_TABLET | Freq: Every day | ORAL | 0 refills | Status: DC
  Filled 2022-07-25: qty 150, 30d supply, fill #0

## 2022-07-25 ENCOUNTER — Other Ambulatory Visit (HOSPITAL_COMMUNITY): Payer: Self-pay

## 2022-07-31 ENCOUNTER — Ambulatory Visit (INDEPENDENT_AMBULATORY_CARE_PROVIDER_SITE_OTHER): Payer: Commercial Managed Care - PPO | Admitting: Infectious Diseases

## 2022-07-31 ENCOUNTER — Other Ambulatory Visit: Payer: Self-pay

## 2022-07-31 ENCOUNTER — Encounter: Payer: Self-pay | Admitting: Infectious Diseases

## 2022-07-31 VITALS — BP 151/92 | HR 83 | Temp 97.9°F | Ht 70.0 in | Wt 172.0 lb

## 2022-07-31 DIAGNOSIS — A4901 Methicillin susceptible Staphylococcus aureus infection, unspecified site: Secondary | ICD-10-CM

## 2022-07-31 DIAGNOSIS — M4624 Osteomyelitis of vertebra, thoracic region: Secondary | ICD-10-CM | POA: Diagnosis not present

## 2022-07-31 DIAGNOSIS — Z5181 Encounter for therapeutic drug level monitoring: Secondary | ICD-10-CM | POA: Diagnosis not present

## 2022-07-31 DIAGNOSIS — A4902 Methicillin resistant Staphylococcus aureus infection, unspecified site: Secondary | ICD-10-CM

## 2022-07-31 NOTE — Progress Notes (Addendum)
Patient Active Problem List   Diagnosis Date Noted   Pseudomonas aeruginosa infection 07/07/2022   Rash and other nonspecific skin eruption 07/03/2022   Osteomyelitis of thoracic region [facility]) 07/03/2022   History of depression    Osteomyelitis of lumbar spine (HCC) 05/08/2022   Midline low back pain    Numbness    Osteomyelitis (HCC)    Chronic diastolic CHF (congestive heart failure) (HCC) 04/23/2022   Chronic pain 04/23/2022   Osteomyelitis of vertebra of thoracolumbar region (HCC) 04/22/2022   Presence of retained hardware 10/17/2021   Infective myositis 08/29/2021   Discitis of thoracolumbar region 07/31/2021   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   MRSA infection    Diabetic peripheral neuropathy (HCC)    Septic discitis of lumbar region 06/11/2021   Infection of spine (HCC) 05/28/2021   Medication monitoring encounter 05/09/2021   MSSA (methicillin susceptible Staphylococcus aureus) infection 05/09/2021   History of COVID-19    Psoas abscess (HCC)    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   Right shoulder pain 02/14/2021   Chest wall abscess 02/14/2021   Normocytic anemia 02/14/2021   Thrombocytosis 02/14/2021   Endocarditis 02/05/2021   Vision changes 02/04/2021   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   Pressure injury of skin 02/01/2021   HTN (hypertension) 02/01/2021   Anxiety 02/01/2021   Left shoulder pain 02/01/2021   Hyponatremia 01/31/2021   Positive colorectal cancer screening using Cologuard test 12/28/2020   DOE (dyspnea on exertion) 10/20/2020   Diabetic polyneuropathy associated with type 2 diabetes mellitus (HCC) 10/09/2020   DM2 (diabetes mellitus, type 2) (HCC) 10/30/2018   Lumbar stenosis with neurogenic claudication 10/14/2017   DDD (degenerative disc disease), lumbar 02/17/2017   Transient weakness of right lower extremity 02/05/2017   Hyperlipidemia associated with type 2 diabetes mellitus (HCC) 10/24/2016   Depression, recurrent (HCC)  10/24/2016   Right knee pain 05/21/2016   DDD (degenerative disc disease), lumbosacral 05/21/2016   Herniation of nucleus pulposus of cervical intervertebral disc without myelopathy 02/29/2016   Carpal tunnel syndrome 02/08/2016   Cervical spondylosis with myelopathy 01/17/2016   Lumbar stenosis 12/13/2014    Patient's Medications  New Prescriptions   No medications on file  Previous Medications   ACETAMINOPHEN (TYLENOL) 500 MG TABLET    Take 1 tablet (500 mg total) by mouth every 6 (six) hours as needed.   BACLOFEN 5 MG TABS    Take 1 tablet by mouth 3 (three) times daily.   CIPROFLOXACIN (CIPRO) 250 MG TABLET    Take 2 tablets (500 mg total) by mouth 2 (two) times daily.   CIPROFLOXACIN (CIPRO) 500 MG TABLET    Take 1 tablet (500 mg total) by mouth 2 (two) times daily.   CONTINUOUS BLOOD GLUC SENSOR (FREESTYLE LIBRE 2 SENSOR) MISC    CHECK BLOOD SUGAR 4 TIMES A DAY   DOXYCYCLINE (VIBRA-TABS) 100 MG TABLET    Take 1 tablet (100 mg total) by mouth 2 (two) times daily.   DULOXETINE (CYMBALTA) 60 MG CAPSULE    Take 1 capsule (60 mg total) by mouth at bedtime.   GABAPENTIN (NEURONTIN) 400 MG CAPSULE    Take 1 capsule (400 mg total) by mouth 2 (two) times daily.   HYDROXYZINE (ATARAX) 10 MG TABLET    Take 1 tablet (10 mg total) by mouth 3 (three) times daily.   METHADONE (DOLOPHINE) 5 MG TABLET    Take 3 tablets (15 mg total) by mouth every 12 (twelve) hours.   MORPHINE (MS CONTIN) 15  MG 12 HR TABLET    Take 15 mg by mouth 3 (three) times daily.   NALOXONE (NARCAN) 0.4 MG/ML INJECTION    Inject 1 mL (0.4 mg total) into the vein as needed.   OXYCODONE-ACETAMINOPHEN (PERCOCET) 10-325 MG TABLET    Take 1 tablet by mouth every 6 (six) hours as needed (breakthrough pain).   OXYCODONE-ACETAMINOPHEN (PERCOCET) 10-325 MG TABLET    Take 1 tablet by mouth every 6 (six) hours. Stop taking methadone.   OXYCODONE-ACETAMINOPHEN (PERCOCET) 10-325 MG TABLET    Take 1 tablet by mouth 5 (five) times daily.    PERMETHRIN (ELIMITE) 5 % CREAM    Apply 1 Application topically every 7 (seven) days. Apply to entire skin from chin down to and including toes, under fingernails . Leave it on for 8-14 hrs, repeat in 1-2 weeks.  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   Admitted on 04/22/2022 with inability to walk, BLE numbness, intermittent fevers and urinary incontinence x3 weeks.  He was found to have acute discitis T9/10-T11/T12 with epidural phlegmon/abscess, septic arthritis T11-T12, extensive enhancement paraspinal soft tissue, as well as soft tissue abscess. 8/30 S/p fluoroscopy guided T1-11 disc aspiration ( Cx PsA), 8/31 aspiration of left back superficial abscess ( Cx PsA). Blood cx 8/28 1/2 sets staph pseudintermedius/epidermidis. This was followed by I&D of subcut abscess with decompression and debridement of osteomyelitis via T10-T11 laminectomy on 09/07 by [name] ( OR cx NG). Seen by ID and antibiotics switched to cefepime and planned for 6 weeks course EOT 10/19 with plan to continue Ciprofloxacin thereafter. Patient was also continued on doxycycline for hardware associated lumbar osteomyelitis. Patient was discharged to CIR due to functional decline on 9/12 and  then discharged home from rehab on 9/22.   Today's visit  Last seen in 11/8 when he was started on ciprofloxacin  po bid due to concerns of persistent infection in the setting of elevated inflammatory markers. He was also referred to Dermatology for rashes. He was instructed permethrin cream for +/- scabies. He has applied the permethrin cream twice a week apart and thinks his rashes have scabbed now. Denies any new rashes or concerns. He has not seen Dermatology yet as he thought rashes are improved and scabbed. He also had bilateral pedal edema last week associated some reddish rashes, not much itching. He took some fluid pills and the swelling resolved. He also used a topical cream from [employer] and wife tells rashes in the bilateral feet has improved. Denies back pain. Takes Morphine ER two tablets twice a day and percocet as needed up to 5 tablets. Wife states his gait is better and able to balance better when he walks with a walker. She is not afraid that he would fall when he walks with a walker like previously. Taking doxycycline and ciprofloxacin as instructed, denies missing doses. Denies  nausea, vomiting, abdominal pain and diarrhea. No concerns otherwise.   Review of Systems: all systems reviewed with pertinent positives and negatives as listed above   Past Medical History:  Diagnosis Date   Depression    Diabetes mellitus without complication (HCC)    Hypertension    Spinal stenosis    With Neurogenic Claudication   Past Surgical History:  Procedure Laterality Date   CARPAL TUNNEL RELEASE Bilateral    INCISION AND DRAINAGE ABSCESS Left 03/12/2021   Procedure: INCISION AND DRAINAGE ABSCESS;  Surgeon: [name], MD;  Location: [facility];  Service: Orthopedics;  Laterality: Left;   IR FLUORO GUIDED NEEDLE PLC ASPIRATION/INJECTION LOC  04/24/2022   IR US GUIDE BX ASP/DRAIN  02/02/2021   IRRIGATION AND DEBRIDEMENT ABSCESS Left 02/08/2021   Procedure: MINOR INCISION AND DRAINAGE OF  ABSCESS;  Surgeon: [name], MD;  Location: [facility];  Service: Orthopedics;  Laterality: Left;   IRRIGATION AND DEBRIDEMENT ABSCESS Left 02/10/2021   Procedure: IRRIGATION AND DEBRIDEMENT ABSCESS LEFT LATERAL CHEST WALL;  Surgeon: [name], MD;  Location: [facility];  Service: General;  Laterality: Left;   IRRIGATION AND DEBRIDEMENT ABSCESS Bilateral 02/20/2021   Procedure: MINOR INCISION AND DRAINAGE OF ABSCESS/WITH BILATERAL WOUND VAC PLACEMENT;  Surgeon: [name], MD;  Location: [facility];  Service: Orthopedics;  Laterality: Bilateral;   IRRIGATION AND DEBRIDEMENT SHOULDER Bilateral 02/28/2021   Procedure: IRRIGATION AND DEBRIDEMENT SHOULDER;  Surgeon: [name], MD;  Location: [facility];  Service: Orthopedics;  Laterality: Bilateral;   IRRIGATION AND DEBRIDEMENT SHOULDER Left 03/06/2021   Procedure: IRRIGATION AND DEBRIDEMENT SHOULDER;  Surgeon: [name], MD;  Location: [facility];  Service: Orthopedics;  Laterality: Left;   IRRIGATION AND DEBRIDEMENT SHOULDER Left 03/08/2021   Procedure: IRRIGATION AND DEBRIDEMENT SHOULDER;  Surgeon: [name], MD;  Location: [facility];  Service: Orthopedics;  Laterality: Left;   LUMBAR LAMINECTOMY FOR EPIDURAL ABSCESS N/A 05/02/2022   Procedure: THORACIC  LAMINECTOMY AND DEBRIDEMENT  FOR EPIDURAL ABSCESS;  Surgeon: [name], MD;  Location: [facility];  Service: Neurosurgery;  Laterality: N/A;   LUMBAR LAMINECTOMY/DECOMPRESSION MICRODISCECTOMY N/A 12/13/2014   Procedure: LUMBAR THREE-FOUR, LUMBAR FOUR-FIVE, LUMBAR FIVE-SACRAL ONE LAMINECTOMY;  Surgeon: [name], MD;  Location: [facility];  Service: Neurosurgery;  Laterality: N/A;  L3-4 L4-5 L5-S1 Laminectomy   LUMBAR LAMINECTOMY/DECOMPRESSION MICRODISCECTOMY Left 06/05/2021   Procedure: Left lumbar one-two, Lumbar two-three Laminectomy;  Surgeon: [name], MD;  Location[facility];  Service: Neurosurgery;  Laterality: Left;   MENISCUS REPAIR     Right knee   MINOR APPLICATION OF WOUND VAC Left  02/20/2021   Procedure: MINOR APPLICATION OF KERLIX PACKING/REMOVAL OF WOUND VAC;  Surgeon: [name], MD;  Location: [facility];  Service: General;  Laterality: Left;   TEE WITHOUT CARDIOVERSION N/A 02/05/2021   Procedure: TRANSESOPHAGEAL ECHOCARDIOGRAM (TEE);  Surgeon: Quintella Reichert, MD;  Location: [facility] ENDOSCOPY;  Service: Cardiovascular;  Laterality: N/A;   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   TOTAL SHOULDER ARTHROPLASTY Right 02/10/2021   Procedure: RIGHT OPEN SHOULDER I & D, OPEN DISTAL CLAVICLE RESECTION;  Surgeon: [name], MD;  Location: [facility];  Service: Orthopedics;  Laterality: Right;   VASECTOMY       Social History   Tobacco Use   Smoking status: Former    Packs/day: 2.00    Years: 15.00    Total pack years: 30.00    Types: Cigarettes    Quit date: 2012  Years since quitting: 11.9   Smokeless tobacco: Never  Vaping Use   Vaping Use: Never used  Substance Use Topics   Alcohol use: Yes    Comment: occasional   Drug use: No    Family History  Problem Relation Age of Onset   Cancer Mother    Colon cancer Neg Hx     Allergies  Allergen Reactions   Tizanidine Other (See Comments)    Leg cramps    Health Maintenance  Topic Date Due   Zoster Vaccines- Shingrix (1 of 2) Never done   Lung Cancer Screening  02/26/2022   INFLUENZA VACCINE  03/26/2022   Diabetic kidney evaluation - Urine ACR  04/23/2022   COVID-19 Vaccine (3 - 2023-24 season) 04/26/2022   HEMOGLOBIN A1C  11/01/2022   FOOT EXAM  11/17/2022   OPHTHALMOLOGY EXAM  11/17/2022   Diabetic kidney evaluation - GFR measurement  07/04/2023   Fecal DNA (Cologuard)  11/03/2023   DTaP/Tdap/Td (2 - Td or Tdap) 08/14/2024   Hepatitis C Screening  Completed   HIV Screening  Completed   HPV VACCINES  Aged Out    Objective: BP (!) 151/92   Pulse 83   Temp 97.9 F (36.6 C) (Oral)   Ht  (1.778 m)   Wt 172 lb (78 kg)   SpO2 98%   BMI 24.68 kg/m    Physical  Exam Constitutional:  older than stated age, extremely malnourished   HENT:     Head: Normocephalic and atraumatic.      Mouth: Mucous membranes are moist.  Eyes:    Conjunctiva/sclera: Conjunctivae normal.     Pupils:   Cardiovascular:     Rate and Rhythm: Normal rate and regular rhythm.     Heart sounds:  Pulmonary:     Effort: Pulmonary effort is normal.     Breath sounds: Normal breath sounds.   Abdominal:     General: Non distended     Palpations: soft.   Musculoskeletal:        General: in wheel chair,  Power of Rt LE 4+/5 Power of Lt LE 4/5  Skin:    General: Skin is warm and dry.     Comments: generalized chronic appearing maculopapular rashes in his torso, upper and lower extremities stable/improving       Neurological:     General: grossly non focal     Mental Status: awake, alert and oriented to person, place, and time.   Psychiatric:        Mood and Affect: Mood normal.   Lab Results Lab Results  Component Value Date   WBC 8.8 07/03/2022   HGB 11.9 (L) 07/03/2022   HCT 36.1 (L) 07/03/2022   MCV 79.5 (L) 07/03/2022   PLT 422 (H) 07/03/2022    Lab Results  Component Value Date   CREATININE 0.58 (L) 07/03/2022   BUN 8 07/03/2022   NA 141 07/03/2022   K 4.5 07/03/2022   CL 102 07/03/2022   CO2 31 07/03/2022    Lab Results  Component Value Date   ALT 18 05/09/2022   AST 14 (L) 05/09/2022   ALKPHOS 92 05/09/2022   BILITOT 0.5 05/09/2022    Lab Results  Component Value Date   CHOL 146 11/08/2021   HDL 43 11/08/2021   LDLCALC 85 11/08/2021   TRIG 97 11/08/2021   CHOLHDL 3.4 11/08/2021   No results found for: "LABRPR", "RPRTITER" No results found for: "HIV1RNAQUANT", "HIV1RNAVL", "CD4TABS"   Microbiology  Results for orders placed or performed during the hospital encounter of 04/22/22  Blood culture (routine x 2)     Status: None   Collection Time: 04/22/22  2:52 PM   Specimen: BLOOD  Result Value Ref Range Status   Specimen  Description BLOOD SITE NOT SPECIFIED  Final   Special Requests   Final    BOTTLES DRAWN AEROBIC AND ANAEROBIC Blood Culture adequate volume   Culture   Final    NO GROWTH 5 DAYS Performed at [facility], [address], [place], [place] [address]    Report Status 04/27/2022 FINAL  Final  Blood culture (routine x 2)     Status: Abnormal   Collection Time: 04/22/22  7:24 PM   Specimen: BLOOD  Result Value Ref Range Status   Specimen Description BLOOD LEFT ANTECUBITAL  Final   Special Requests   Final    BOTTLES DRAWN AEROBIC AND ANAEROBIC Blood Culture results may not be optimal due to an excessive volume of blood received in culture bottles   Culture  Setup Time   Final    GRAM POSITIVE COCCI IN CLUSTERS IN BOTH AEROBIC AND ANAEROBIC BOTTLES CRITICAL RESULT CALLED TO, READ BACK BY AND VERIFIED WITH: PHARMD LISA CURRAN ON 04/23/22 @ 1951 BY DRT    Culture (A)  Final    STAPHYLOCOCCUS PSEUDINTERMEDIUS STAPHYLOCOCCUS EPIDERMIDIS THE SIGNIFICANCE OF ISOLATING THIS ORGANISM FROM A SINGLE SET OF BLOOD CULTURES WHEN MULTIPLE SETS ARE DRAWN IS UNCERTAIN. PLEASE NOTIFY THE MICROBIOLOGY DEPARTMENT WITHIN ONE WEEK IF SPECIATION AND SENSITIVITIES ARE REQUIRED. Performed at [facility], [address], [place], [place] [address]    Report Status 04/25/2022 FINAL  Final  Blood Culture ID Panel (Reflexed)     Status: Abnormal   Collection Time: 04/22/22  7:24 PM  Result Value Ref Range Status   Enterococcus faecalis NOT DETECTED NOT DETECTED Final   Enterococcus Faecium NOT DETECTED NOT DETECTED Final   Listeria monocytogenes NOT DETECTED NOT DETECTED Final   Staphylococcus species DETECTED (A) NOT DETECTED Final    Comment: CRITICAL RESULT CALLED TO, READ BACK BY AND VERIFIED WITH: PHARMD LISA CURRAN ON 04/23/22 @ 1951 BY DRT    Staphylococcus aureus (BCID) NOT DETECTED NOT DETECTED Final   Staphylococcus epidermidis DETECTED (A) NOT DETECTED Final    Comment: Methicillin  (oxacillin) resistant coagulase negative staphylococcus. Possible blood culture contaminant (unless isolated from more than one blood culture draw or clinical case suggests pathogenicity). No antibiotic treatment is indicated for blood  culture contaminants. CRITICAL RESULT CALLED TO, READ BACK BY AND VERIFIED WITH: PHARMD LISA CURRAN ON 04/23/22 @ 1951 BY DRT    Staphylococcus lugdunensis NOT DETECTED NOT DETECTED Final   Streptococcus species NOT DETECTED NOT DETECTED Final   Streptococcus agalactiae NOT DETECTED NOT DETECTED Final   Streptococcus pneumoniae NOT DETECTED NOT DETECTED Final   Streptococcus pyogenes NOT DETECTED NOT DETECTED Final   A.calcoaceticus-baumannii NOT DETECTED NOT DETECTED Final   Bacteroides fragilis NOT DETECTED NOT DETECTED Final   Enterobacterales NOT DETECTED NOT DETECTED Final   Enterobacter cloacae complex NOT DETECTED NOT DETECTED Final   Escherichia coli NOT DETECTED NOT DETECTED Final   Klebsiella aerogenes NOT DETECTED NOT DETECTED Final   Klebsiella oxytoca NOT DETECTED NOT DETECTED Final   Klebsiella pneumoniae NOT DETECTED NOT DETECTED Final   Proteus species NOT DETECTED NOT DETECTED Final   Salmonella species NOT DETECTED NOT DETECTED Final   Serratia marcescens NOT DETECTED NOT DETECTED Final  Haemophilus influenzae NOT DETECTED NOT DETECTED Final   Neisseria meningitidis NOT DETECTED NOT DETECTED Final   Pseudomonas aeruginosa NOT DETECTED NOT DETECTED Final   Stenotrophomonas maltophilia NOT DETECTED NOT DETECTED Final   Candida albicans NOT DETECTED NOT DETECTED Final   Candida auris NOT DETECTED NOT DETECTED Final   Candida glabrata NOT DETECTED NOT DETECTED Final   Candida krusei NOT DETECTED NOT DETECTED Final   Candida parapsilosis NOT DETECTED NOT DETECTED Final   Candida tropicalis NOT DETECTED NOT DETECTED Final   Cryptococcus neoformans/gattii NOT DETECTED NOT DETECTED Final   Methicillin resistance mecA/C DETECTED (A) NOT  DETECTED Final    Comment: CRITICAL RESULT CALLED TO, READ BACK BY AND VERIFIED WITH: PHARMD LISA CURRAN ON 04/23/22 @ 1951 BY DRT Performed at [facility], [address] [place], [place] [address]   Body fluid culture w Gram Stain     Status: Abnormal   Collection Time: 04/24/22 11:37 AM   Specimen: PATH Disc; Body Fluid  Result Value Ref Range Status   Specimen Description FLUID  Final   Special Requests  DISC  Final   Gram Stain   Final    RARE WBC PRESENT, PREDOMINANTLY PMN RARE GRAM VARIABLE ROD Performed at [facility], [address] [place], [place] [address]    Culture PSEUDOMONAS AERUGINOSA (A)  Final   Report Status 04/26/2022 FINAL  Final   Organism ID, Bacteria PSEUDOMONAS AERUGINOSA  Final      Susceptibility   Pseudomonas aeruginosa - MIC*    CEFTAZIDIME 4 SENSITIVE Sensitive     CIPROFLOXACIN <=0.25 SENSITIVE Sensitive     GENTAMICIN <=1 SENSITIVE Sensitive     IMIPENEM 1 SENSITIVE Sensitive     PIP/TAZO 8 SENSITIVE Sensitive     CEFEPIME 2 SENSITIVE Sensitive     * PSEUDOMONAS AERUGINOSA  Aerobic/Anaerobic Culture w Gram Stain (surgical/deep wound)     Status: None   Collection Time: 04/25/22 12:10 PM   Specimen: Abscess  Result Value Ref Range Status   Specimen Description ABSCESS  Final   Special Requests NONE  Final   Gram Stain   Final    FEW WBC PRESENT, PREDOMINANTLY PMN NO ORGANISMS SEEN    Culture   Final    FEW PSEUDOMONAS AERUGINOSA NO ANAEROBES ISOLATED Performed at [facility], [address] [place], [place] [address]    Report Status 04/30/2022 FINAL  Final   Organism ID, Bacteria PSEUDOMONAS AERUGINOSA  Final      Susceptibility   Pseudomonas aeruginosa - MIC*    CEFTAZIDIME 4 SENSITIVE Sensitive     CIPROFLOXACIN <=0.25 SENSITIVE Sensitive     GENTAMICIN <=1 SENSITIVE Sensitive     IMIPENEM 1 SENSITIVE Sensitive     PIP/TAZO 8 SENSITIVE Sensitive     CEFEPIME 2 SENSITIVE Sensitive     * FEW PSEUDOMONAS  AERUGINOSA  Surgical pcr screen     Status: None   Collection Time: 05/02/22 10:33 AM   Specimen: Nasal Mucosa; Nasal Swab  Result Value Ref Range Status   MRSA, PCR NEGATIVE NEGATIVE Final   Staphylococcus aureus NEGATIVE NEGATIVE Final    Comment: (NOTE) The Xpert SA Assay (FDA approved for NASAL specimens in patients 55 years of age and older), is one component of a comprehensive surveillance program. It is not intended to diagnose infection nor to guide or monitor treatment. Performed at [facility], [address] [place], [place] [address]   Aerobic/Anaerobic Culture w  Gram Stain (surgical/deep wound)     Status: None   Collection Time: 05/02/22  4:41 PM   Specimen: PATH Soft tissue  Result Value Ref Range Status   Specimen Description TISSUE  Final   Special Requests SQ TISSUE  Final   Gram Stain NO WBC SEEN NO ORGANISMS SEEN   Final   Culture   Final    No growth aerobically or anaerobically. Performed at [facility], [address], [place], [place] [address]    Report Status 05/07/2022 FINAL  Final  Aerobic/Anaerobic Culture w Gram Stain (surgical/deep wound)     Status: None   Collection Time: 05/02/22  5:18 PM   Specimen: PATH Soft tissue  Result Value Ref Range Status   Specimen Description TISSUE  Final   Special Requests THORACIC 9 AND 10 LAMINECTOMY  Final   Gram Stain NO WBC SEEN NO ORGANISMS SEEN   Final   Culture   Final    No growth aerobically or anaerobically. Performed at [facility], [address] [place], [place] [address]    Report Status 05/07/2022 FINAL  Final   Imaging No results found.  Assessment/Plan  # T9-T12 osteomyelitis  # left T11-T12 septic arthritis # epidural phlegmon/abscess extending from t9-t12 resulting in severe T10-T11 severe spinal canal stenosis Completed IV cefepime on 10/19, has not been on ciprofloxacin since then ( has missed several appts) Started on ciprofloxacin  po bid given  elevated inflammatory markers 07/03/22. EKG 11/8 qtc 410  Continue ciprofloxacin as is Will plan to eventually stop ciprofloxacin if continues to do well clinically and improvement in inflammatory markers. He does not have a hardware placed in T spine in last OR.  Fu in a month.    # Medication Monitoring  Labs today   # Lumbar Osteomyelitis complicated with hardware Continue po doxycycline indefinitely  # Generalized maculopapular rashes - s/p permethrin cream, thinks there is some improvement. No new rashes on exam today.  - Fu with Dermatology   I have personally spent 45 minutes involved in face-to-face and non-face-to-face activities for this patient on the day of the visit. Professional time spent includes the following activities: Preparing to see the patient (review of tests), Obtaining and/or reviewing separately obtained history (admission/discharge record), Performing a medically appropriate examination and/or evaluation , Ordering medications/tests/procedures, referring and communicating with other health care professionals, Documenting clinical information in the EMR, Independently interpreting results (not separately reported), Communicating results to the patient/family/caregiver, Counseling and educating the patient/family/caregiver and Care coordination (not separately reported).   Victoriano Lain, MD [facility] 07/31/2022, 1:37 PM

## 2022-08-01 LAB — BASIC METABOLIC PANEL
BUN: 10 mg/dL (ref 7–25)
CO2: 29 mmol/L (ref 20–32)
Calcium: 10 mg/dL (ref 8.6–10.3)
Chloride: 101 mmol/L (ref 98–110)
Creat: 0.72 mg/dL (ref 0.70–1.30)
Glucose, Bld: 126 mg/dL — ABNORMAL HIGH (ref 65–99)
Potassium: 4.5 mmol/L (ref 3.5–5.3)
Sodium: 140 mmol/L (ref 135–146)

## 2022-08-01 LAB — CBC
HCT: 38.2 % — ABNORMAL LOW (ref 38.5–50.0)
Hemoglobin: 13 g/dL — ABNORMAL LOW (ref 13.2–17.1)
MCH: 27.3 pg (ref 27.0–33.0)
MCHC: 34 g/dL (ref 32.0–36.0)
MCV: 80.3 fL (ref 80.0–100.0)
MPV: 9.9 fL (ref 7.5–12.5)
Platelets: 350 10*3/uL (ref 140–400)
RBC: 4.76 10*6/uL (ref 4.20–5.80)
RDW: 17.3 % — ABNORMAL HIGH (ref 11.0–15.0)
WBC: 7.8 10*3/uL (ref 3.8–10.8)

## 2022-08-01 LAB — C-REACTIVE PROTEIN: CRP: 11.6 mg/L — ABNORMAL HIGH (ref <8.0)

## 2022-08-01 LAB — SEDIMENTATION RATE: Sed Rate: 29 mm/h — ABNORMAL HIGH (ref 0–20)

## 2022-08-09 ENCOUNTER — Ambulatory Visit: Payer: Commercial Managed Care - PPO | Admitting: Family

## 2022-08-16 ENCOUNTER — Encounter: Payer: Self-pay | Admitting: Family

## 2022-08-16 ENCOUNTER — Ambulatory Visit (INDEPENDENT_AMBULATORY_CARE_PROVIDER_SITE_OTHER): Payer: Commercial Managed Care - PPO | Admitting: Family

## 2022-08-16 VITALS — BP 121/63 | HR 62 | Temp 98.0°F | Ht 70.0 in | Wt 168.8 lb

## 2022-08-16 DIAGNOSIS — G8929 Other chronic pain: Secondary | ICD-10-CM

## 2022-08-16 DIAGNOSIS — F419 Anxiety disorder, unspecified: Secondary | ICD-10-CM

## 2022-08-16 DIAGNOSIS — E441 Mild protein-calorie malnutrition: Secondary | ICD-10-CM

## 2022-08-16 DIAGNOSIS — M5136 Other intervertebral disc degeneration, lumbar region: Secondary | ICD-10-CM

## 2022-08-16 DIAGNOSIS — F339 Major depressive disorder, recurrent, unspecified: Secondary | ICD-10-CM

## 2022-08-16 DIAGNOSIS — I10 Essential (primary) hypertension: Secondary | ICD-10-CM

## 2022-08-16 DIAGNOSIS — E785 Hyperlipidemia, unspecified: Secondary | ICD-10-CM

## 2022-08-16 DIAGNOSIS — I5032 Chronic diastolic (congestive) heart failure: Secondary | ICD-10-CM

## 2022-08-16 DIAGNOSIS — E1169 Type 2 diabetes mellitus with other specified complication: Secondary | ICD-10-CM

## 2022-08-16 DIAGNOSIS — E1142 Type 2 diabetes mellitus with diabetic polyneuropathy: Secondary | ICD-10-CM

## 2022-08-16 LAB — BAYER DCA HB A1C WAIVED: HB A1C (BAYER DCA - WAIVED): 7 % — ABNORMAL HIGH (ref 4.8–5.6)

## 2022-08-16 MED ORDER — GABAPENTIN 400 MG PO CAPS: 400.0000 mg | ORAL_CAPSULE | Freq: Two times a day (BID) | ORAL | 2 refills | Status: DC

## 2022-08-16 NOTE — Patient Instructions (Signed)
High-Protein and High-Calorie Diet Eating high-protein and high-calorie foods can help you to gain weight, heal after an injury, and recover after an illness or surgery. The specific amount of daily protein and calories you need depends on: Your body weight. The reason this diet is recommended for you. Generally, a high-protein, high-calorie diet involves: Eating 250-500 extra calories each day. Making sure that you get enough of your daily calories from protein. Ask your health care provider how many of your calories should come from protein. Talk with a health care provider or a dietitian about how much protein and how many calories you need each day. Follow the diet as directed by your health care provider. What are tips for following this plan?  Reading food labels Check the nutrition facts label for calories, grams of fat and protein. Items with more than 4 grams of protein are high-protein foods. Preparing meals Add whole milk, half-and-half, or heavy cream to cereal, pudding, soup, or hot cocoa. Add whole milk to instant breakfast drinks. Add peanut butter to oatmeal or smoothies. Add powdered milk to baked goods, smoothies, or milkshakes. Add powdered milk, cream, or butter to mashed potatoes. Add cheese to cooked vegetables. Make whole-milk yogurt parfaits. Top them with granola, fruit, or nuts. Add cottage cheese to fruit. Add avocado, cheese, or both to sandwiches or salads. Add avocado to smoothies. Add meat, poultry, or seafood to rice, pasta, casseroles, salads, and soups. Use mayonnaise when making egg salad, chicken salad, or tuna salad. Use peanut butter as a dip for fruits and vegetables or as a topping for pretzels, celery, or crackers. Add beans to casseroles, dips, and spreads. Add pureed beans to sauces and soups. Replace calorie-free drinks with calorie-containing drinks, such as milk and fruit juice. Replace water with milk or heavy cream when making foods such as  oatmeal, pudding, or cocoa. Add oil or butter to cooked vegetables and grains. Add cream cheese to sandwiches or as a topping on crackers and bread. Make cream-based pastas and soups. General information Ask your health care provider if you should take a nutritional supplement. Try to eat six small meals each day instead of three large meals. A general goal is to eat every 2 to 3 hours. Eat a balanced diet. In each meal, include one food that is high in protein and one food with fat in it. Keep nutritious snacks available, such as nuts, trail mixes, dried fruit, and yogurt. If you have kidney disease or diabetes, talk with your health care provider about how much protein is safe for you. Too much protein may put extra stress on your kidneys. Drink your calories. Choose high-calorie drinks and have them after your meals. Consider setting a timer to remind you to eat. You will want to eat even if you do not feel very hungry. What high-protein foods should I eat?  Vegetables Soybeans. Peas. Grains Quinoa. Bulgur wheat. Buckwheat. Meats and other proteins Beef, pork, and poultry. Fish and seafood. Eggs. Tofu. Textured vegetable protein (TVP). Peanut butter. Nuts and seeds. Dried beans. Protein powders. Hummus. Dairy Whole milk. Whole-milk yogurt. Powdered milk. Cheese. Cottage Cheese. Eggnog. Beverages High-protein supplement drinks. Soy milk. Other foods Protein bars. The items listed above may not be a complete list of foods and beverages you can eat and drink. Contact a dietitian for more information. What high-calorie foods should I eat? Fruits Dried fruit. Fruit leather. Canned fruit in syrup. Fruit juice. Avocado. Vegetables Vegetables cooked in oil or butter. Fried potatoes. Grains   Pasta. Quick breads. Muffins. Pancakes. Ready-to-eat cereal. Meats and other proteins Peanut butter. Nuts and seeds. Dairy Heavy cream. Whipped cream. Cream cheese. Sour cream. Ice cream. Custard.  Pudding. Whole milk dairy products. Beverages Meal-replacement beverages. Nutrition shakes. Fruit juice. Seasonings and condiments Salad dressing. Mayonnaise. Alfredo sauce. Fruit preserves or jelly. Honey. Syrup. Sweets and desserts Cake. Cookies. Pie. Pastries. Candy bars. Chocolate. Fats and oils Butter or margarine. Oil. Gravy. Other foods Meal-replacement bars. The items listed above may not be a complete list of foods and beverages you can eat and drink. Contact a dietitian for more information. Summary A high-protein, high-calorie diet can help you gain weight or heal faster after an injury, illness, or surgery. To increase your protein and calories, add ingredients such as whole milk, peanut butter, cheese, beans, meat, or seafood to meal items. To get enough extra calories each day, include high-calorie foods and beverages at each meal. Adding a high-calorie drink or shake can be an easy way to help you get enough calories each day. Talk with your healthcare provider or dietitian about the best options for you. This information is not intended to replace advice given to you by your health care provider. Make sure you discuss any questions you have with your health care provider. Document Revised: 07/16/2020 Document Reviewed: 07/16/2020 Elsevier Patient Education  2023 [employer]  

## 2022-08-16 NOTE — Progress Notes (Signed)
Subjective:    Patient ID: John Ware, male    DOB: [birth year], 56 y.o.   MRN: [id number]  Chief Complaint  Patient presents with   Medical Management of Chronic Issues   PT presents to the office today for chronic follow up. He is followed by [facility] every month for chronic back pain. Has hx of lumbar stenosis.    He is followed by ID for psoas muscle abscess. He is currently taking cipro BID and doxycyline daily.  He is followed by Ortho as needed.   Hypertension This is a chronic problem. The current episode started more than 1 year ago. The problem has been resolved since onset. Associated symptoms include anxiety and malaise/fatigue. Pertinent negatives include no blurred vision, peripheral edema or shortness of breath. Risk factors for coronary artery disease include dyslipidemia, male gender and sedentary lifestyle. The current treatment provides moderate improvement. Hypertensive end-organ damage includes heart failure.  Congestive Heart Failure Presents for follow-up visit. Associated symptoms include fatigue. Pertinent negatives include no edema or shortness of breath. The symptoms have been stable.  Hyperlipidemia This is a chronic problem. The current episode started more than 1 year ago. The problem is controlled. Recent lipid tests were reviewed and are normal. Pertinent negatives include no shortness of breath. Current antihyperlipidemic treatment includes diet change. The current treatment provides moderate improvement of lipids. Risk factors for coronary artery disease include dyslipidemia, hypertension, male sex and a sedentary lifestyle.  Diabetes He presents for his follow-up diabetic visit. He has type 2 diabetes mellitus. Hypoglycemia symptoms include nervousness/anxiousness. Associated symptoms include fatigue. Pertinent negatives for diabetes include no blurred vision and no foot paresthesias. Symptoms are stable. Risk factors for coronary artery disease include  dyslipidemia, diabetes mellitus, hypertension, male sex and sedentary lifestyle. He is following a generally healthy diet. His overall blood glucose range is 130-140 mg/dl. Eye exam is current.  Anemia Presents for follow-up visit. Symptoms include malaise/fatigue. Past medical history includes heart failure.  Back Pain This is a chronic problem. The current episode started more than 1 year ago. The problem occurs intermittently. The problem has been waxing and waning since onset. The pain is present in the lumbar spine. The quality of the pain is described as aching. The pain is at a severity of 2/10. The pain is mild. He has tried analgesics for the symptoms. The treatment provided moderate relief.  Depression        This is a chronic problem.  The current episode started more than 1 year ago.   The onset quality is gradual.   The problem occurs intermittently.  Associated symptoms include fatigue, helplessness, hopelessness, decreased interest and sad.  Past treatments include SNRIs - Serotonin and norepinephrine reuptake inhibitors.  Past medical history includes anxiety.   Anxiety Presents for follow-up visit. Symptoms include excessive worry, irritability and nervous/anxious behavior. Patient reports no shortness of breath. The severity of symptoms is moderate.   His past medical history is significant for anemia.      Review of Systems  Constitutional:  Positive for fatigue, irritability and malaise/fatigue.  Eyes:  Negative for blurred vision.  Respiratory:  Negative for shortness of breath.   Musculoskeletal:  Positive for back pain.  Psychiatric/Behavioral:  Positive for depression. The patient is nervous/anxious.   All other systems reviewed and are negative.      Objective:   Physical Exam Vitals reviewed.  Constitutional:      General: He is not in acute distress.  Appearance: He is underweight.     Comments: Body appearance  HENT:     Head: Normocephalic.     Right  Ear: Tympanic membrane normal.     Left Ear: Tympanic membrane normal.  Eyes:     General:        Right eye: No discharge.        Left eye: No discharge.     Pupils: Pupils are equal, round, and reactive to light.  Neck:     Thyroid: No thyromegaly.  Cardiovascular:     Rate and Rhythm: Normal rate and regular rhythm.     Heart sounds: Normal heart sounds. No murmur heard. Pulmonary:     Effort: Pulmonary effort is normal. No respiratory distress.     Breath sounds: Normal breath sounds. No wheezing.  Abdominal:     General: Bowel sounds are normal. There is no distension.     Palpations: Abdomen is soft.     Tenderness: There is no abdominal tenderness.  Musculoskeletal:        General: No tenderness. Normal range of motion.     Cervical back: Normal range of motion and neck supple.  Skin:    General: Skin is warm and dry.     Coloration: Skin is pale.     Findings: No erythema or rash.  Neurological:     Mental Status: He is alert and oriented to person, place, and time.     Cranial Nerves: No cranial nerve deficit.     Motor: Weakness (in wheelchair) present.     Gait: Gait abnormal.     Deep Tendon Reflexes: Reflexes are normal and symmetric.  Psychiatric:        Behavior: Behavior normal.        Thought Content: Thought content normal.        Judgment: Judgment normal.       BP 121/63   Pulse 62   Temp 98 F (36.7 C) (Temporal)   Ht 5\' 10"  (1.778 m)   Wt 168 lb 12.8 oz (76.6 kg)   BMI 24.22 kg/m      Assessment & Plan:  Cora L Gimbel comes in today with chief complaint of Medical Management of Chronic Issues   Diagnosis and orders addressed:  1. Anxiety  2. Chronic diastolic CHF (congestive heart failure) (HCC)  3. Other chronic pain  4. DDD (degenerative disc disease), lumbar  5. Depression, recurrent (HCC)  6. Diabetic peripheral neuropathy (HCC)  - gabapentin (NEURONTIN) 400 MG capsule; Take 1 capsule (400 mg total) by mouth 2 (two)  times daily.  Dispense: 180 capsule; Refill: 2  7. Diabetic polyneuropathy associated with type 2 diabetes mellitus (HCC)  8. Type 2 diabetes mellitus with other specified complication, without long-term current use of insulin (HCC) - Microalbumin / creatinine urine ratio - Bayer DCA Hb A1c Waived  9. Primary hypertension  10. Hyperlipidemia associated with type 2 diabetes mellitus (HCC)  11. Mild protein-calorie malnutrition (HCC)   Labs pending Health Maintenance reviewed Diet and exercise encouraged  Follow up plan: 4 months    , FNP

## 2022-08-17 LAB — MICROALBUMIN / CREATININE URINE RATIO
Creatinine, Urine: 91.5 mg/dL
Microalb/Creat Ratio: 16 mg/g creat (ref 0–29)
Microalbumin, Urine: 14.8 ug/mL

## 2022-08-21 ENCOUNTER — Other Ambulatory Visit (HOSPITAL_COMMUNITY): Payer: Self-pay

## 2022-08-21 MED ORDER — BACLOFEN 5 MG PO TABS
1.0000 | ORAL_TABLET | Freq: Three times a day (TID) | ORAL | 2 refills | Status: DC
  Filled 2022-08-21: qty 90, 30d supply, fill #0

## 2022-08-21 MED ORDER — DULOXETINE HCL 60 MG PO CPEP
60.0000 mg | ORAL_CAPSULE | Freq: Every day | ORAL | 0 refills | Status: DC
  Filled 2022-08-21: qty 90, 90d supply, fill #0

## 2022-08-21 MED ORDER — OXYCODONE-ACETAMINOPHEN 10-325 MG PO TABS
1.0000 | ORAL_TABLET | Freq: Every day | ORAL | 0 refills | Status: DC
  Filled 2022-08-21: qty 150, 30d supply, fill #0

## 2022-08-21 MED ORDER — MORPHINE SULFATE ER 30 MG PO TBCR
30.0000 mg | EXTENDED_RELEASE_TABLET | Freq: Two times a day (BID) | ORAL | 0 refills | Status: DC
  Filled 2022-08-21: qty 60, 30d supply, fill #0

## 2022-08-21 MED ORDER — HYDROXYZINE HCL 10 MG PO TABS
10.0000 mg | ORAL_TABLET | Freq: Three times a day (TID) | ORAL | 2 refills | Status: DC
  Filled 2022-08-21: qty 90, 30d supply, fill #0

## 2022-08-22 ENCOUNTER — Other Ambulatory Visit: Payer: Self-pay | Admitting: Family

## 2022-08-22 DIAGNOSIS — E1169 Type 2 diabetes mellitus with other specified complication: Secondary | ICD-10-CM

## 2022-08-27 ENCOUNTER — Ambulatory Visit: Payer: Commercial Managed Care - PPO | Admitting: Infectious Diseases

## 2022-09-18 ENCOUNTER — Other Ambulatory Visit (HOSPITAL_COMMUNITY): Payer: Self-pay

## 2022-09-18 MED ORDER — OXYCODONE-ACETAMINOPHEN 10-325 MG PO TABS
1.0000 | ORAL_TABLET | Freq: Every day | ORAL | 0 refills | Status: DC
  Filled 2022-09-18: qty 150, 30d supply, fill #0

## 2022-09-18 MED ORDER — MORPHINE SULFATE ER 30 MG PO TBCR
30.0000 mg | EXTENDED_RELEASE_TABLET | Freq: Two times a day (BID) | ORAL | 0 refills | Status: DC
  Filled 2022-09-18: qty 60, 30d supply, fill #0

## 2022-09-25 ENCOUNTER — Ambulatory Visit (INDEPENDENT_AMBULATORY_CARE_PROVIDER_SITE_OTHER): Payer: Commercial Managed Care - PPO | Admitting: Infectious Diseases

## 2022-09-25 ENCOUNTER — Encounter: Payer: Self-pay | Admitting: Infectious Diseases

## 2022-09-25 ENCOUNTER — Other Ambulatory Visit: Payer: Self-pay

## 2022-09-25 VITALS — BP 124/82 | HR 85 | Temp 98.2°F | Ht 70.0 in | Wt 168.0 lb

## 2022-09-25 DIAGNOSIS — Z5181 Encounter for therapeutic drug level monitoring: Secondary | ICD-10-CM

## 2022-09-25 DIAGNOSIS — R21 Rash and other nonspecific skin eruption: Secondary | ICD-10-CM

## 2022-09-25 DIAGNOSIS — M4624 Osteomyelitis of vertebra, thoracic region: Secondary | ICD-10-CM

## 2022-09-25 DIAGNOSIS — A4902 Methicillin resistant Staphylococcus aureus infection, unspecified site: Secondary | ICD-10-CM | POA: Diagnosis not present

## 2022-09-25 DIAGNOSIS — A4901 Methicillin susceptible Staphylococcus aureus infection, unspecified site: Secondary | ICD-10-CM | POA: Diagnosis not present

## 2022-09-25 DIAGNOSIS — A498 Other bacterial infections of unspecified site: Secondary | ICD-10-CM

## 2022-09-25 NOTE — Progress Notes (Signed)
Patient Active Problem List   Diagnosis Date Noted   Pseudomonas aeruginosa infection 07/07/2022   Rash and other nonspecific skin eruption 07/03/2022   Osteomyelitis of thoracic region [facility](HCC) 07/03/2022   Osteomyelitis of lumbar spine (HCC) 05/08/2022   Midline low back pain    Numbness    Osteomyelitis (HCC)    Chronic diastolic CHF (congestive heart failure) (HCC) 04/23/2022   Chronic pain 04/23/2022   Osteomyelitis of vertebra of thoracolumbar region (HCC) 04/22/2022   Presence of retained hardware 10/17/2021   Infective myositis 08/29/2021   Discitis of thoracolumbar region 07/31/2021   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   MRSA infection    Diabetic peripheral neuropathy (HCC)    Septic discitis of lumbar region 06/11/2021   Infection of spine (HCC) 05/28/2021   Medication monitoring encounter 05/09/2021   MSSA (methicillin susceptible Staphylococcus aureus) infection 05/09/2021   History of COVID-19    Psoas abscess (HCC)    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   Right shoulder pain 02/14/2021   Chest wall abscess 02/14/2021   Normocytic anemia 02/14/2021   Thrombocytosis 02/14/2021   Endocarditis 02/05/2021   Vision changes 02/04/2021   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   Pressure injury of skin 02/01/2021   HTN (hypertension) 02/01/2021   Anxiety 02/01/2021   Left shoulder pain 02/01/2021   Hyponatremia 01/31/2021   Positive colorectal cancer screening using Cologuard test 12/28/2020   DOE (dyspnea on exertion) 10/20/2020   Diabetic polyneuropathy associated with type 2 diabetes mellitus (HCC) 10/09/2020   DM2 (diabetes mellitus, type 2) (HCC) 10/30/2018   Lumbar stenosis with neurogenic claudication 10/14/2017   DDD (degenerative disc disease), lumbar 02/17/2017   Transient weakness of right lower extremity 02/05/2017   Hyperlipidemia associated with type 2 diabetes mellitus (HCC) 10/24/2016   Depression, recurrent (HCC) 10/24/2016   Right knee pain  05/21/2016   DDD (degenerative disc disease), lumbosacral 05/21/2016   Herniation of nucleus pulposus of cervical intervertebral disc without myelopathy 02/29/2016   Carpal tunnel syndrome 02/08/2016   Cervical spondylosis with myelopathy 01/17/2016   Lumbar stenosis 12/13/2014   Current Outpatient Medications on File Prior to Visit  Medication Sig Dispense Refill   acetaminophen (TYLENOL) 500 MG tablet Take 1 tablet (500 mg total) by mouth every 6 (six) hours as needed. 30 tablet 0   Baclofen 5 MG TABS Take 1 tablet by mouth 3 (three) times daily. 90 tablet 2   ciprofloxacin (CIPRO) 250 MG tablet Take 2 tablets (500 mg total) by mouth 2 (two) times daily. 60 tablet 2   ciprofloxacin (CIPRO) 500 MG tablet Take 1 tablet (500 mg total) by mouth 2 (two) times daily. 60 tablet 2   Continuous Blood Gluc Sensor (FREESTYLE LIBRE 2 SENSOR) MISC CHECK BLOOD SUGAR 4 TIMES A DAY 6 each 1   doxycycline (VIBRA-TABS) 100 MG tablet Take 1 tablet (100 mg total) by mouth 2 (two) times daily. 60 tablet 11   DULoxetine (CYMBALTA) 60 MG capsule Take 1 capsule (60 mg total) by mouth at bedtime. 90 capsule 0   gabapentin (NEURONTIN) 400 MG capsule Take 1 capsule (400 mg total) by mouth 2 (two) times daily. 180 capsule 2   hydrOXYzine (ATARAX) 10 MG tablet Take 1 tablet (10 mg total) by mouth 3 (three) times daily. 90 tablet 2   morphine (MS CONTIN) 15 MG 12 hr tablet Take 15 mg by mouth 3 (three) times daily.     morphine (MS CONTIN) 30 MG 12 hr tablet Take 1 tablet (30 mg  total) by mouth every 12 (twelve) hours. 60 tablet 0   oxyCODONE-acetaminophen (PERCOCET) 10-325 MG tablet Take 1 tablet by mouth every 6 (six) hours as needed (breakthrough pain). 28 tablet 0   oxyCODONE-acetaminophen (PERCOCET) 10-325 MG tablet Take 1 tablet by mouth every 6 (six) hours. Stop taking methadone. 28 tablet 0   oxyCODONE-acetaminophen (PERCOCET) 10-325 MG tablet Take 1 tablet by mouth 5 (five) times daily. 150 tablet 0    oxyCODONE-acetaminophen (PERCOCET) 10-325 MG tablet Take 1 tablet by mouth 5 (five) times daily. 150 tablet 0   oxyCODONE-acetaminophen (PERCOCET) 10-325 MG tablet Take 1 tablet by mouth 5 (five) times daily. 150 tablet 0   Baclofen 5 MG TABS Take 1 tablet by mouth 3 (three) times daily. (Patient not taking: Reported on 08/16/2022) 90 tablet 5   DULoxetine (CYMBALTA) 60 MG capsule Take 1 capsule (60 mg total) by mouth at bedtime. (Patient not taking: Reported on 08/16/2022) 30 capsule 0   hydrOXYzine (ATARAX) 10 MG tablet Take 1 tablet (10 mg total) by mouth 3 (three) times daily. (Patient not taking: Reported on 08/16/2022) 90 tablet 0   methadone (DOLOPHINE) 5 MG tablet Take 3 tablets (15 mg total) by mouth every 12 (twelve) hours. (Patient not taking: Reported on 08/16/2022) 30 tablet 0   naloxone (NARCAN) 0.4 MG/ML injection Inject 1 mL (0.4 mg total) into the vein as needed. (Patient not taking: Reported on 08/16/2022) 1 mL 0   No current facility-administered medications on file prior to visit.   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   Admitted on 04/22/2022 with inability to walk, BLE numbness, intermittent fevers and urinary incontinence x3 weeks.  He was found to have acute discitis T9/10-T11/T12 with epidural phlegmon/abscess, septic arthritis T11-T12,  extensive enhancement paraspinal soft tissue, as well as soft tissue abscess. 8/30 S/p fluoroscopy guided T1-11 disc aspiration ( Cx PsA), 8/31 aspiration of left back superficial abscess ( Cx PsA). Blood cx 8/28 1/2 sets staph pseudintermedius/epidermidis. This was followed by I&D of subcut abscess with decompression and debridement of osteomyelitis via T10-T11 laminectomy on 09/07 by [name] ( OR cx NG). Seen by ID and antibiotics switched to cefepime and planned for 6 weeks course EOT 10/19 with plan to continue Ciprofloxacin thereafter. Patient was also continued on doxycycline for hardware associated lumbar osteomyelitis. Patient was discharged to CIR due to functional decline on 9/12 and then discharged home from rehab on 9/22.   07/31/22 Last seen in 11/8 when he was started on ciprofloxacin 750mg  po bid due to concerns of persistent infection in the setting of elevated inflammatory markers. He was also referred to Dermatology for rashes. He was instructed permethrin cream for +/- scabies. He has applied the permethrin cream twice a week apart and thinks his rashes have scabbed now. Denies any new rashes or concerns. He has not seen Dermatology yet as he thought rashes are improved and scabbed. He also had bilateral pedal edema last week associated some reddish rashes,  not much itching. He took some fluid pills and the swelling resolved. He also used a topical cream from [employer] and wife tells rashes in the bilateral feet has improved. Denies back pain. Takes Morphine ER two tablets twice a day and percocet as needed up to 5 tablets. Wife states his gait is better and able to balance better when he walks with a walker. She is not afraid that he would fall when he walks with a walker like previously. Taking doxycycline and ciprofloxacin as instructed, denies missing doses. Denies nausea, vomiting, abdominal pain and diarrhea. No concerns otherwise.   09/25/22 Taking PO doxycycline and ciprofloxacin as  prescribed without any concerns. He has not been following with PT but doing exercise himself and feels getting better and gaining strength. He is able to stand briefly and was able to briefly walk for a short distance with the help of walker yesterday. Denies fevers, chills, nausea, vomiting and diarrhea. Prior rashes has resolved, however he did not see a dermatologist. He feels overall well today and has no complaints.   Review of Systems: all systems reviewed with pertinent positives and negatives as listed above   Past Medical History:  Diagnosis Date   Depression    Diabetes mellitus without complication (HCC)    Hypertension    Spinal stenosis    With Neurogenic Claudication   Past Surgical History:  Procedure Laterality Date   CARPAL TUNNEL RELEASE Bilateral    INCISION AND DRAINAGE ABSCESS Left 03/12/2021   Procedure: INCISION AND DRAINAGE ABSCESS;  Surgeon: [name], MD;  Location: [facility];  Service: Orthopedics;  Laterality: Left;   IR FLUORO GUIDED NEEDLE PLC ASPIRATION/INJECTION LOC  04/24/2022   IR US GUIDE BX ASP/DRAIN  02/02/2021   IRRIGATION AND DEBRIDEMENT ABSCESS Left 02/08/2021   Procedure: MINOR INCISION AND DRAINAGE OF ABSCESS;  Surgeon: [name], MD;  Location: [facility];  Service: Orthopedics;  Laterality: Left;   IRRIGATION AND DEBRIDEMENT ABSCESS Left 02/10/2021   Procedure: IRRIGATION AND DEBRIDEMENT ABSCESS LEFT LATERAL CHEST WALL;  Surgeon: [name], MD;  Location: [facility];  Service: General;  Laterality: Left;   IRRIGATION AND DEBRIDEMENT ABSCESS Bilateral 02/20/2021   Procedure: MINOR INCISION AND DRAINAGE OF ABSCESS/WITH BILATERAL WOUND VAC PLACEMENT;  Surgeon: [name], MD;  Location: [facility];  Service: Orthopedics;  Laterality: Bilateral;   IRRIGATION AND DEBRIDEMENT SHOULDER Bilateral 02/28/2021   Procedure: IRRIGATION AND DEBRIDEMENT SHOULDER;  Surgeon: [name], MD;  Location: [facility];  Service: Orthopedics;  Laterality: Bilateral;    IRRIGATION AND DEBRIDEMENT SHOULDER Left 03/06/2021   Procedure: IRRIGATION AND DEBRIDEMENT SHOULDER;  Surgeon: [name], MD;  Location: [facility];  Service: Orthopedics;  Laterality: Left;   IRRIGATION AND DEBRIDEMENT SHOULDER Left 03/08/2021   Procedure: IRRIGATION AND DEBRIDEMENT SHOULDER;  Surgeon: [name], MD;  Location: [facility];  Service: Orthopedics;  Laterality: Left;   LUMBAR LAMINECTOMY FOR EPIDURAL ABSCESS N/A 05/02/2022   Procedure: THORACIC  LAMINECTOMY AND DEBRIDEMENT  FOR EPIDURAL ABSCESS;  Surgeon: [name], MD;  Location: [facility];  Service: Neurosurgery;  Laterality: N/A;   LUMBAR LAMINECTOMY/DECOMPRESSION MICRODISCECTOMY N/A 12/13/2014   Procedure: LUMBAR THREE-FOUR, LUMBAR FOUR-FIVE, LUMBAR FIVE-SACRAL ONE LAMINECTOMY;  Surgeon: [name], MD;  Location: [facility];  Service: Neurosurgery;  Laterality: N/A;  L3-4 L4-5 L5-S1 Laminectomy   LUMBAR LAMINECTOMY/DECOMPRESSION MICRODISCECTOMY Left 06/05/2021   Procedure: Left lumbar one-two, Lumbar two-three Laminectomy;  Surgeon: [name], MD;  Location: [facility] OR;  Service: Neurosurgery;  Laterality: Left;  MENISCUS REPAIR     Right knee   MINOR APPLICATION OF WOUND VAC Left 02/20/2021   Procedure: MINOR APPLICATION OF KERLIX PACKING/REMOVAL OF WOUND VAC;  Surgeon: Johnathan Hausen, MD;  Location: [facility];  Service: General;  Laterality: Left;   TEE WITHOUT CARDIOVERSION N/A 02/05/2021   Procedure: TRANSESOPHAGEAL ECHOCARDIOGRAM (TEE);  Surgeon: Sueanne Margarita, MD;  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   TOTAL SHOULDER ARTHROPLASTY Right 02/10/2021   Procedure: RIGHT OPEN SHOULDER I & D, OPEN DISTAL CLAVICLE RESECTION;  Surgeon: Marchia Bond, MD;  Location: [facility];  Service: Orthopedics;  Laterality: Right;   VASECTOMY       Social History   Tobacco Use   Smoking status: Former    Packs/day: 2.00    Years: 15.00    Total pack  years: 30.00    Types: Cigarettes    Quit date: 2012    Years since quitting: 12.0   Smokeless tobacco: Never  Vaping Use   Vaping Use: Never used  Substance Use Topics   Alcohol use: Yes    Comment: occasional   Drug use: No    Family History  Problem Relation Age of Onset   Cancer Mother    Colon cancer Neg Hx     Allergies  Allergen Reactions   Tizanidine Other (See Comments)    Leg cramps    Health Maintenance  Topic Date Due   COVID-19 Vaccine (3 - 2023-24 season) 04/26/2022   Zoster Vaccines- Shingrix (1 of 2) 11/15/2022 (Originally 06/26/2017)   INFLUENZA VACCINE  11/24/2022 (Originally 03/26/2022)   Lung Cancer Screening  08/17/2023 (Originally 02/26/2022)   FOOT EXAM  11/17/2022   OPHTHALMOLOGY EXAM  11/17/2022   HEMOGLOBIN A1C  02/15/2023   Diabetic kidney evaluation - eGFR measurement  08/01/2023   Diabetic kidney evaluation - Urine ACR  08/17/2023   Fecal DNA (Cologuard)  11/03/2023   DTaP/Tdap/Td (2 - Td or Tdap) 08/14/2024   Hepatitis C Screening  Completed   HIV Screening  Completed   HPV VACCINES  Aged Out    Objective: BP 124/82   Pulse 85   Temp 98.2 F (36.8 C) (Oral)   Ht 5\' 10"  (1.778 m)   Wt 168 lb (76.2 kg)   SpO2 95%   BMI 24.11 kg/m    Physical Exam Constitutional:  older than stated age, extremely malnourished   HENT:     Head: Normocephalic and atraumatic.      Mouth: Mucous membranes are moist.  Eyes:    Conjunctiva/sclera: Conjunctivae normal.     Pupils:   Cardiovascular:     Rate and Rhythm: Normal rate and regular rhythm.     Heart sounds:  Pulmonary:     Effort: Pulmonary effort is normal.     Breath sounds: Normal breath sounds.   Abdominal:     General: Non distended     Palpations: soft.   Musculoskeletal:        General: in wheel chair,  Power of Rt LE 4 to 4+/5 Power of Lt LE 4 to 4+/5  Skin:    General: Skin is warm and dry.     Comments: generalized chronic appearing maculopapular rashes in his torso,  upper and lower extremities - resolved   Neurological:     General: grossly non focal     Mental Status: awake, alert and oriented to person, place, and  time.   Psychiatric:        Mood and Affect: Mood normal.   Lab Results Lab Results  Component Value Date   WBC 7.8 07/31/2022   HGB 13.0 (L) 07/31/2022   HCT 38.2 (L) 07/31/2022   MCV 80.3 07/31/2022   PLT 350 07/31/2022    Lab Results  Component Value Date   CREATININE 0.72 07/31/2022   BUN 10 07/31/2022   NA 140 07/31/2022   K 4.5 07/31/2022   CL 101 07/31/2022   CO2 29 07/31/2022    Lab Results  Component Value Date   ALT 18 05/09/2022   AST 14 (L) 05/09/2022   ALKPHOS 92 05/09/2022   BILITOT 0.5 05/09/2022    Lab Results  Component Value Date   CHOL 146 11/08/2021   HDL 43 11/08/2021   LDLCALC 85 11/08/2021   TRIG 97 11/08/2021   CHOLHDL 3.4 11/08/2021   No results found for: "LABRPR", "RPRTITER" No results found for: "HIV1RNAQUANT", "HIV1RNAVL", "CD4TABS"   Microbiology  Results for orders placed or performed during the hospital encounter of 04/22/22  Blood culture (routine x 2)     Status: None   Collection Time: 04/22/22  2:52 PM   Specimen: BLOOD  Result Value Ref Range Status   Specimen Description BLOOD SITE NOT SPECIFIED  Final   Special Requests   Final    BOTTLES DRAWN AEROBIC AND ANAEROBIC Blood Culture adequate volume   Culture   Final    NO GROWTH 5 DAYS Performed at [facility], [address], [place], [place] [address]    Report Status 04/27/2022 FINAL  Final  Blood culture (routine x 2)     Status: Abnormal   Collection Time: 04/22/22  7:24 PM   Specimen: BLOOD  Result Value Ref Range Status   Specimen Description BLOOD LEFT ANTECUBITAL  Final   Special Requests   Final    BOTTLES DRAWN AEROBIC AND ANAEROBIC Blood Culture results may not be optimal due to an excessive volume of blood received in culture bottles   Culture  Setup Time   Final    GRAM POSITIVE COCCI IN  CLUSTERS IN BOTH AEROBIC AND ANAEROBIC BOTTLES CRITICAL RESULT CALLED TO, READ BACK BY AND VERIFIED WITH: PHARMD LISA Callery ON 04/23/22 @ 1951 BY DRT    Culture (A)  Final    STAPHYLOCOCCUS PSEUDINTERMEDIUS STAPHYLOCOCCUS EPIDERMIDIS THE SIGNIFICANCE OF ISOLATING THIS ORGANISM FROM A SINGLE SET OF BLOOD CULTURES WHEN MULTIPLE SETS ARE DRAWN IS UNCERTAIN. PLEASE NOTIFY THE MICROBIOLOGY DEPARTMENT WITHIN ONE WEEK IF SPECIATION AND SENSITIVITIES ARE REQUIRED. Performed at [facility], [place] [address] [place],  [address]    Report Status 04/25/2022 FINAL  Final  Blood Culture ID Panel (Reflexed)     Status: Abnormal   Collection Time: 04/22/22  7:24 PM  Result Value Ref Range Status   Enterococcus faecalis NOT DETECTED NOT DETECTED Final   Enterococcus Faecium NOT DETECTED NOT DETECTED Final   Listeria monocytogenes NOT DETECTED NOT DETECTED Final   Staphylococcus species DETECTED (A) NOT DETECTED Final    Comment: CRITICAL RESULT CALLED TO, READ BACK BY AND VERIFIED WITH: PHARMD LISA CURRAN ON 04/23/22 @ 1951 BY DRT    Staphylococcus aureus (BCID) NOT DETECTED NOT DETECTED Final   Staphylococcus epidermidis DETECTED (A) NOT DETECTED Final    Comment: Methicillin (oxacillin) resistant coagulase negative staphylococcus. Possible blood culture contaminant (unless isolated from more than one blood culture draw or clinical case suggests pathogenicity). No  antibiotic treatment is indicated for blood  culture contaminants. CRITICAL RESULT CALLED TO, READ BACK BY AND VERIFIED WITH: PHARMD LISA CURRAN ON 04/23/22 @ 1951 BY DRT    Staphylococcus lugdunensis NOT DETECTED NOT DETECTED Final   Streptococcus species NOT DETECTED NOT DETECTED Final   Streptococcus agalactiae NOT DETECTED NOT DETECTED Final   Streptococcus pneumoniae NOT DETECTED NOT DETECTED Final   Streptococcus pyogenes NOT DETECTED NOT DETECTED Final   A.calcoaceticus-baumannii NOT DETECTED NOT DETECTED Final    Bacteroides fragilis NOT DETECTED NOT DETECTED Final   Enterobacterales NOT DETECTED NOT DETECTED Final   Enterobacter cloacae complex NOT DETECTED NOT DETECTED Final   Escherichia coli NOT DETECTED NOT DETECTED Final   Klebsiella aerogenes NOT DETECTED NOT DETECTED Final   Klebsiella oxytoca NOT DETECTED NOT DETECTED Final   Klebsiella pneumoniae NOT DETECTED NOT DETECTED Final   Proteus species NOT DETECTED NOT DETECTED Final   Salmonella species NOT DETECTED NOT DETECTED Final   Serratia marcescens NOT DETECTED NOT DETECTED Final   Haemophilus influenzae NOT DETECTED NOT DETECTED Final   Neisseria meningitidis NOT DETECTED NOT DETECTED Final   Pseudomonas aeruginosa NOT DETECTED NOT DETECTED Final   Stenotrophomonas maltophilia NOT DETECTED NOT DETECTED Final   Candida albicans NOT DETECTED NOT DETECTED Final   Candida auris NOT DETECTED NOT DETECTED Final   Candida glabrata NOT DETECTED NOT DETECTED Final   Candida krusei NOT DETECTED NOT DETECTED Final   Candida parapsilosis NOT DETECTED NOT DETECTED Final   Candida tropicalis NOT DETECTED NOT DETECTED Final   Cryptococcus neoformans/gattii NOT DETECTED NOT DETECTED Final   Methicillin resistance mecA/C DETECTED (A) NOT DETECTED Final    Comment: CRITICAL RESULT CALLED TO, READ BACK BY AND VERIFIED WITH: PHARMD LISA CURRAN ON 04/23/22 @ 1951 BY DRT Performed at [facility], [address], [place], [place] [address]   Body fluid culture w Gram Stain     Status: Abnormal   Collection Time: 04/24/22 11:37 AM   Specimen: PATH Disc; Body Fluid  Result Value Ref Range Status   Specimen Description FLUID  Final   Special Requests  DISC  Final   Gram Stain   Final    RARE WBC PRESENT, PREDOMINANTLY PMN RARE GRAM VARIABLE ROD Performed at [facility] [address] [place], [place] [address]    Culture PSEUDOMONAS AERUGINOSA (A)  Final   Report Status 04/26/2022 FINAL  Final   Organism ID, Bacteria PSEUDOMONAS  AERUGINOSA  Final      Susceptibility   Pseudomonas aeruginosa - MIC*    CEFTAZIDIME 4 SENSITIVE Sensitive     CIPROFLOXACIN <=0.25 SENSITIVE Sensitive     GENTAMICIN <=1 SENSITIVE Sensitive     IMIPENEM 1 SENSITIVE Sensitive     PIP/TAZO 8 SENSITIVE Sensitive     CEFEPIME 2 SENSITIVE Sensitive     * PSEUDOMONAS AERUGINOSA  Aerobic/Anaerobic Culture w Gram Stain (surgical/deep wound)     Status: None   Collection Time: 04/25/22 12:10 PM   Specimen: Abscess  Result Value Ref Range Status   Specimen Description ABSCESS  Final   Special Requests NONE  Final   Gram Stain   Final    FEW WBC PRESENT, PREDOMINANTLY PMN NO ORGANISMS SEEN    Culture   Final    FEW PSEUDOMONAS AERUGINOSA NO ANAEROBES ISOLATED Performed at [facility], [place] [address] [place], [place] [address]    Report Status 04/30/2022 FINAL  Final   Organism ID, Bacteria PSEUDOMONAS AERUGINOSA  Final      Susceptibility   Pseudomonas aeruginosa - MIC*    CEFTAZIDIME 4 SENSITIVE Sensitive     CIPROFLOXACIN <=0.25 SENSITIVE Sensitive     GENTAMICIN <=1 SENSITIVE Sensitive     IMIPENEM 1 SENSITIVE Sensitive     PIP/TAZO 8 SENSITIVE Sensitive     CEFEPIME 2 SENSITIVE Sensitive     * FEW PSEUDOMONAS AERUGINOSA  Surgical pcr screen     Status: None   Collection Time: 05/02/22 10:33 AM   Specimen: Nasal Mucosa; Nasal Swab  Result Value Ref Range Status   MRSA, PCR NEGATIVE NEGATIVE Final   Staphylococcus aureus NEGATIVE NEGATIVE Final    Comment: (NOTE) The Xpert SA Assay (FDA approved for NASAL specimens in patients 16 years of age and older), is one component of a comprehensive surveillance program. It is not intended to diagnose infection nor to guide or monitor treatment. Performed at [facility], [address], [place], [place] [address]   Aerobic/Anaerobic Culture w Gram Stain (surgical/deep wound)     Status: None   Collection Time: 05/02/22  4:41 PM   Specimen: PATH Soft tissue  Result  Value Ref Range Status   Specimen Description TISSUE  Final   Special Requests SQ TISSUE  Final   Gram Stain NO WBC SEEN NO ORGANISMS SEEN   Final   Culture   Final    No growth aerobically or anaerobically. Performed at [facility], [address], [place], [place] [address]    Report Status 05/07/2022 FINAL  Final  Aerobic/Anaerobic Culture w Gram Stain (surgical/deep wound)     Status: None   Collection Time: 05/02/22  5:18 PM   Specimen: PATH Soft tissue  Result Value Ref Range Status   Specimen Description TISSUE  Final   Special Requests THORACIC 9 AND 10 LAMINECTOMY  Final   Gram Stain NO WBC SEEN NO ORGANISMS SEEN   Final   Culture   Final    No growth aerobically or anaerobically. Performed at [facility], [address], [place], [place] [address]    Report Status 05/07/2022 FINAL  Final   Imaging No results found.  Assessment/Plan  # T9-T12 osteomyelitis  # left T11-T12 septic arthritis # epidural phlegmon/abscess extending from t9-t12 resulting in severe T10-T11 severe spinal canal stenosis Completed IV cefepime on 10/19, has not been on ciprofloxacin since then ( has missed several appts) Started on ciprofloxacin 750mg  po bid given elevated inflammatory markers 07/03/22. EKG 11/8 qtc 410  Continue ciprofloxacin 750 mg po bid as is. He has adequate refills Will plan to eventually stop ciprofloxacin if continues to do well clinically and improvement in inflammatory markers. He does not have a hardware placed in T spine in last OR.  Fu in 2 month, EKG next visit    # Medication Monitoring  Labs today   # Lumbar Osteomyelitis complicated with hardware Continue po doxycycline indefinitely  # Generalized maculopapular rashes Resolved   I have personally spent 40 minutes involved in face-to-face and non-face-to-face activities for this patient on the day of the visit. Professional time spent includes the following activities: Preparing to see the  patient (review of tests), Obtaining and/or reviewing separately obtained history (admission/discharge record), Performing a medically appropriate examination and/or evaluation , Ordering medications/tests/procedures, referring and communicating with other health care professionals, Documenting clinical information in the EMR, Independently interpreting results (not separately reported), Communicating results to the patient/family/caregiver, Counseling and educating the patient/family/caregiver and Care  coordination (not separately reported).   Victoriano LainSabina Manadhar, MD [facility] 09/25/2022, 2:23 PM

## 2022-09-26 ENCOUNTER — Telehealth: Payer: Self-pay

## 2022-09-26 LAB — C-REACTIVE PROTEIN: CRP: 41.1 mg/L — ABNORMAL HIGH (ref <8.0)

## 2022-09-26 LAB — BASIC METABOLIC PANEL
BUN/Creatinine Ratio: 14 (calc) (ref 6–22)
BUN: 9 mg/dL (ref 7–25)
CO2: 27 mmol/L (ref 20–32)
Calcium: 9.8 mg/dL (ref 8.6–10.3)
Chloride: 100 mmol/L (ref 98–110)
Creat: 0.63 mg/dL — ABNORMAL LOW (ref 0.70–1.30)
Glucose, Bld: 162 mg/dL — ABNORMAL HIGH (ref 65–99)
Potassium: 4.7 mmol/L (ref 3.5–5.3)
Sodium: 137 mmol/L (ref 135–146)

## 2022-09-26 LAB — CBC
HCT: 37.7 % — ABNORMAL LOW (ref 38.5–50.0)
Hemoglobin: 13.2 g/dL (ref 13.2–17.1)
MCH: 27.7 pg (ref 27.0–33.0)
MCHC: 35 g/dL (ref 32.0–36.0)
MCV: 79.2 fL — ABNORMAL LOW (ref 80.0–100.0)
MPV: 9.7 fL (ref 7.5–12.5)
Platelets: 416 10*3/uL — ABNORMAL HIGH (ref 140–400)
RBC: 4.76 10*6/uL (ref 4.20–5.80)
RDW: 14 % (ref 11.0–15.0)
WBC: 7.3 10*3/uL (ref 3.8–10.8)

## 2022-09-26 LAB — SEDIMENTATION RATE: Sed Rate: 31 mm/h — ABNORMAL HIGH (ref 0–20)

## 2022-09-26 MED ORDER — CIPROFLOXACIN HCL 250 MG PO TABS: 250.0000 mg | ORAL_TABLET | Freq: Two times a day (BID) | ORAL | 1 refills | Status: DC

## 2022-09-26 MED ORDER — CIPROFLOXACIN HCL 250 MG PO TABS: 250.0000 mg | ORAL_TABLET | Freq: Two times a day (BID) | ORAL | 0 refills | Status: DC

## 2022-09-26 MED ORDER — CIPROFLOXACIN HCL 500 MG PO TABS: 500.0000 mg | ORAL_TABLET | Freq: Two times a day (BID) | ORAL | 1 refills | Status: DC

## 2022-09-26 NOTE — Telephone Encounter (Signed)
-----  Message from Rosiland Oz, MD sent at 09/26/2022  7:37 AM EST ----- Please let him know that his inflammatory markers have gone up from last values. Plan to continue current antibiotics as discussed.

## 2022-09-26 NOTE — Addendum Note (Signed)
Addended by: Rosiland Oz on: 09/26/2022 06:12 PM   Modules accepted: Orders

## 2022-09-26 NOTE — Telephone Encounter (Signed)
Left patient a HIPAA compliant voicemail to return office call.  Eugenia Mcalpine, LPN

## 2022-10-16 ENCOUNTER — Other Ambulatory Visit (HOSPITAL_COMMUNITY): Payer: Self-pay

## 2022-10-16 MED ORDER — OXYCODONE-ACETAMINOPHEN 10-325 MG PO TABS
1.0000 | ORAL_TABLET | Freq: Every day | ORAL | 0 refills | Status: DC
  Filled 2022-10-16: qty 150, 30d supply, fill #0

## 2022-10-16 MED ORDER — MORPHINE SULFATE ER 30 MG PO TBCR
30.0000 mg | EXTENDED_RELEASE_TABLET | Freq: Two times a day (BID) | ORAL | 0 refills | Status: DC
  Filled 2022-10-16: qty 60, 30d supply, fill #0

## 2022-11-13 ENCOUNTER — Other Ambulatory Visit (HOSPITAL_COMMUNITY): Payer: Self-pay

## 2022-11-13 MED ORDER — OXYCODONE-ACETAMINOPHEN 10-325 MG PO TABS
ORAL_TABLET | ORAL | 0 refills | Status: DC
  Filled 2022-11-13 (×2): qty 150, 30d supply, fill #0

## 2022-11-13 MED ORDER — BACLOFEN 5 MG PO TABS
5.0000 mg | ORAL_TABLET | Freq: Three times a day (TID) | ORAL | 2 refills | Status: DC
  Filled 2022-11-13: qty 90, 30d supply, fill #0

## 2022-11-13 MED ORDER — HYDROXYZINE HCL 10 MG PO TABS
10.0000 mg | ORAL_TABLET | Freq: Three times a day (TID) | ORAL | 2 refills | Status: DC
  Filled 2022-11-13: qty 90, 30d supply, fill #0

## 2022-11-13 MED ORDER — MORPHINE SULFATE ER 30 MG PO TBCR
EXTENDED_RELEASE_TABLET | ORAL | 0 refills | Status: DC
  Filled 2022-11-13: qty 60, 30d supply, fill #0

## 2022-11-13 MED ORDER — DULOXETINE HCL 60 MG PO CPEP
60.0000 mg | ORAL_CAPSULE | Freq: Every day | ORAL | 0 refills | Status: DC
  Filled 2022-11-13: qty 90, 90d supply, fill #0

## 2022-11-14 ENCOUNTER — Other Ambulatory Visit (HOSPITAL_COMMUNITY): Payer: Self-pay

## 2022-11-20 ENCOUNTER — Other Ambulatory Visit: Payer: Self-pay

## 2022-11-20 ENCOUNTER — Other Ambulatory Visit (HOSPITAL_COMMUNITY): Payer: Self-pay

## 2022-11-20 ENCOUNTER — Encounter: Payer: Self-pay | Admitting: Infectious Diseases

## 2022-11-20 ENCOUNTER — Ambulatory Visit (INDEPENDENT_AMBULATORY_CARE_PROVIDER_SITE_OTHER): Payer: Commercial Managed Care - PPO | Admitting: Infectious Diseases

## 2022-11-20 VITALS — BP 145/92 | HR 77 | Temp 98.1°F | Wt 183.2 lb

## 2022-11-20 DIAGNOSIS — Z5181 Encounter for therapeutic drug level monitoring: Secondary | ICD-10-CM

## 2022-11-20 DIAGNOSIS — A498 Other bacterial infections of unspecified site: Secondary | ICD-10-CM | POA: Diagnosis not present

## 2022-11-20 DIAGNOSIS — A4902 Methicillin resistant Staphylococcus aureus infection, unspecified site: Secondary | ICD-10-CM

## 2022-11-20 DIAGNOSIS — M4624 Osteomyelitis of vertebra, thoracic region: Secondary | ICD-10-CM | POA: Diagnosis not present

## 2022-11-20 NOTE — Progress Notes (Signed)
Patient Active Problem List   Diagnosis Date Noted  . Pseudomonas aeruginosa infection 07/07/2022  . Osteomyelitis of thoracic region ([place]) 07/03/2022  . Osteomyelitis of lumbar spine ([place]) 05/08/2022  . Midline low back pain   . Numbness   . Osteomyelitis ([place])   . Chronic diastolic CHF (congestive heart failure) ([place]) 04/23/2022  . Chronic pain 04/23/2022  . Osteomyelitis of vertebra of thoracolumbar region ([place]) 04/22/2022  . Presence of retained hardware 10/17/2021  . Infective myositis 08/29/2021  . Discitis of thoracolumbar region 07/31/2021  . 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  . MRSA infection   . Diabetic peripheral neuropathy ([place])   . Septic discitis of lumbar region 06/11/2021  . Infection of spine ([place]) 05/28/2021  . Medication monitoring encounter 05/09/2021  . MSSA (methicillin susceptible Staphylococcus aureus) infection 05/09/2021  . History of COVID-19   . Psoas abscess ([place])   . Psoas muscle abscess ([place]) 02/15/2021  . Right shoulder pain 02/14/2021  . Chest wall abscess 02/14/2021  . Normocytic anemia 02/14/2021  . Thrombocytosis 02/14/2021  . Endocarditis 02/05/2021  . Vision changes 02/04/2021  . 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  . Pressure injury of skin 02/01/2021  . HTN (hypertension) 02/01/2021  . Anxiety 02/01/2021  . Left shoulder pain 02/01/2021  . Hyponatremia 01/31/2021  . Positive colorectal cancer screening using Cologuard test 12/28/2020  . DOE (dyspnea on exertion) 10/20/2020  . Diabetic polyneuropathy associated with type 2 diabetes mellitus ([place]) 10/09/2020  . DM2 (diabetes mellitus, type 2) ([place]) 10/30/2018  . Lumbar stenosis with neurogenic claudication 10/14/2017  . DDD (degenerative disc disease), lumbar 02/17/2017  . Transient weakness of right lower extremity 02/05/2017  . Hyperlipidemia associated with type 2 diabetes mellitus ([place]) 10/24/2016  . Depression, recurrent ([place]) 10/24/2016  . Right knee pain 05/21/2016  .  DDD (degenerative disc disease), lumbosacral 05/21/2016  . Herniation of nucleus pulposus of cervical intervertebral disc without myelopathy 02/29/2016  . Carpal tunnel syndrome 02/08/2016  . Cervical spondylosis with myelopathy 01/17/2016  . Lumbar stenosis 12/13/2014   Current Outpatient Medications on File Prior to Visit  Medication Sig Dispense Refill  . acetaminophen (TYLENOL) 500 MG tablet Take 1 tablet (500 mg total) by mouth every 6 (six) hours as needed. 30 tablet 0  . Baclofen 5 MG TABS Take 1 tablet by mouth 3 (three) times daily. 90 tablet 2  . Baclofen 5 MG TABS Take 1 tablet (5 mg total) by mouth 3 (three) times daily. 90 tablet 2  . ciprofloxacin (CIPRO) 250 MG tablet Take 1 tablet (250 mg total) by mouth 2 (two) times daily. 60 tablet 1  . ciprofloxacin (CIPRO) 500 MG tablet Take 1 tablet (500 mg total) by mouth 2 (two) times daily. 60 tablet 1  . Continuous Blood Gluc Sensor (FREESTYLE LIBRE 2 SENSOR) MISC CHECK BLOOD SUGAR 4 TIMES A DAY 6 each 1  . doxycycline (VIBRA-TABS) 100 MG tablet Take 1 tablet (100 mg total) by mouth 2 (two) times daily. 60 tablet 11  . DULoxetine (CYMBALTA) 60 MG capsule Take 1 capsule (60 mg total) by mouth at bedtime. 90 capsule 0  . gabapentin (NEURONTIN) 400 MG capsule Take 1 capsule (400 mg total) by mouth 2 (two) times daily. 180 capsule 2  . hydrOXYzine (ATARAX) 10 MG tablet Take 1 tablet (10 mg total) by mouth 3 (three) times daily. 90 tablet 2  . hydrOXYzine (ATARAX) 10 MG tablet Take 1 tablet (10 mg total) by mouth 3 (three) times daily. 90 tablet 2  .  morphine (MS CONTIN) 15 MG 12 hr tablet Take 15 mg by mouth 3 (three) times daily.    Marland Kitchen morphine (MS CONTIN) 30 MG 12 hr tablet Take 1 tablet by mouth every twelve hours 60 tablet 0  . oxyCODONE-acetaminophen (PERCOCET) 10-325 MG tablet Take 1 tablet by mouth every 6 (six) hours as needed (breakthrough pain). 28 tablet 0  . oxyCODONE-acetaminophen (PERCOCET) 10-325 MG tablet Take 1 tablet by  mouth every 6 (six) hours. Stop taking methadone. 28 tablet 0  . oxyCODONE-acetaminophen (PERCOCET) 10-325 MG tablet Take 1 tablet by mouth 5 (five) times daily. 150 tablet 0  . oxyCODONE-acetaminophen (PERCOCET) 10-325 MG tablet Take 1 tablet by mouth 5 (five) times daily. 150 tablet 0  . oxyCODONE-acetaminophen (PERCOCET) 10-325 MG tablet Take 1 tablet by mouth 5 (five) times daily. 150 tablet 0  . oxyCODONE-acetaminophen (PERCOCET) 10-325 MG tablet Take 1 tablet by mouth 5 (five) times daily. 150 tablet 0  . oxyCODONE-acetaminophen (PERCOCET) 10-325 MG tablet Take 1 tablet by mouth five times a day 150 tablet 0  . Baclofen 5 MG TABS Take 1 tablet by mouth 3 (three) times daily. (Patient not taking: Reported on 08/16/2022) 90 tablet 5  . DULoxetine (CYMBALTA) 60 MG capsule Take 1 capsule (60 mg total) by mouth at bedtime. (Patient not taking: Reported on 08/16/2022) 30 capsule 0  . hydrOXYzine (ATARAX) 10 MG tablet Take 1 tablet (10 mg total) by mouth 3 (three) times daily. (Patient not taking: Reported on 08/16/2022) 90 tablet 0  . methadone (DOLOPHINE) 5 MG tablet Take 3 tablets (15 mg total) by mouth every 12 (twelve) hours. (Patient not taking: Reported on 08/16/2022) 30 tablet 0  . naloxone (NARCAN) 0.4 MG/ML injection Inject 1 mL (0.4 mg total) into the vein as needed. (Patient not taking: Reported on 08/16/2022) 1 mL 0   No current facility-administered medications on file prior to visit.   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   Admitted on 04/22/2022 with inability to walk, BLE numbness, intermittent fevers and urinary incontinence x3 weeks.  He was found to have acute discitis T9/10-T11/T12 with epidural phlegmon/abscess, septic arthritis T11-T12, extensive enhancement paraspinal soft tissue, as well as soft tissue abscess. 8/30 S/p fluoroscopy guided T1-11 disc aspiration ( Cx PsA), 8/31 aspiration of left back superficial abscess ( Cx PsA). Blood cx 8/28 1/2 sets staph pseudintermedius/epidermidis. This was followed by I&D of subcut abscess with decompression and debridement of osteomyelitis via T10-T11 laminectomy on 09/07 by Dr. Ellene Route ( OR cx NG). Seen by ID and antibiotics switched to cefepime and planned for 6 weeks course EOT 10/19 with plan to continue Ciprofloxacin thereafter. Patient was also continued on doxycycline for hardware associated lumbar osteomyelitis. Patient was discharged to CIR due to functional decline on 9/12 and then discharged home from rehab on 9/22.   11/8 when he was started on ciprofloxacin 750mg  po bid due to concerns of persistent infection in the setting of elevated inflammatory markers and  continues to take PO doxycycline as is without any concerns.    11/20/22 Denies missing doses or concerns with the abtx like nausea, vomiting, rashes and diarrhea. Back pain is mostly when stands or walks. Takes percocet 5 times a day and morphine twice a day.  Able to walk with the help of cane. He does exercises at home himself.   Review of Systems: all systems reviewed with pertinent positives and negatives as listed above   Past Medical History:  Diagnosis Date  . Depression   . Diabetes mellitus without complication ([place])   . Hypertension   . Spinal stenosis    With Neurogenic Claudication   Past Surgical  History:  Procedure Laterality Date  . CARPAL TUNNEL RELEASE Bilateral   . INCISION AND DRAINAGE ABSCESS Left 03/12/2021   Procedure: INCISION AND DRAINAGE ABSCESS;  Surgeon: Marchia Bond, MD;  Location: [facility];  Service: Orthopedics;  Laterality: Left;  . IR FLUORO GUIDED NEEDLE PLC ASPIRATION/INJECTION LOC  04/24/2022  . IR US GUIDE BX ASP/DRAIN  02/02/2021  . IRRIGATION AND DEBRIDEMENT ABSCESS Left 02/08/2021   Procedure: MINOR INCISION AND DRAINAGE OF ABSCESS;  Surgeon: Marchia Bond, MD;  [facility];  Service: Orthopedics;  Laterality: Left;  . IRRIGATION AND DEBRIDEMENT ABSCESS Left 02/10/2021   Procedure: IRRIGATION AND DEBRIDEMENT ABSCESS LEFT LATERAL CHEST WALL;  Surgeon: Greer Pickerel, MD;  [facility]: [facility];  Service: General;  Laterality: Left;  . IRRIGATION AND DEBRIDEMENT ABSCESS Bilateral 02/20/2021   Procedure: MINOR INCISION AND DRAINAGE OF ABSCESS/WITH BILATERAL WOUND VAC PLACEMENT;  Surgeon: Marchia Bond, MD;  [facility];  Service: Orthopedics;  Laterality: Bilateral;  . IRRIGATION AND DEBRIDEMENT SHOULDER Bilateral 02/28/2021   Procedure: IRRIGATION AND DEBRIDEMENT SHOULDER;  Surgeon: Hiram Gash, MD;  [facility];  Service: Orthopedics;  Laterality: Bilateral;  . IRRIGATION AND DEBRIDEMENT SHOULDER Left 03/06/2021   Procedure: IRRIGATION AND DEBRIDEMENT SHOULDER;  Surgeon: Renette Butters, MD;  [facility];  Service: Orthopedics;  Laterality: Left;  . IRRIGATION AND DEBRIDEMENT SHOULDER Left 03/08/2021   Procedure: IRRIGATION AND DEBRIDEMENT SHOULDER;  Surgeon: Marchia Bond, MD;  [facility];  Service: Orthopedics;  Laterality: Left;  . LUMBAR LAMINECTOMY FOR EPIDURAL ABSCESS N/A 05/02/2022   Procedure: THORACIC  LAMINECTOMY AND DEBRIDEMENT  FOR EPIDURAL ABSCESS;  Surgeon: Kristeen Miss, MD;  Location: [facility];  Service: Neurosurgery;  Laterality: N/A;  . LUMBAR LAMINECTOMY/DECOMPRESSION MICRODISCECTOMY N/A 12/13/2014   Procedure: LUMBAR THREE-FOUR,  LUMBAR FOUR-FIVE, LUMBAR FIVE-SACRAL ONE LAMINECTOMY;  Surgeon: Kristeen Miss, MD;  Location: [facility];  Service: Neurosurgery;  Laterality: N/A;  L3-4 L4-5 L5-S1 Laminectomy  . LUMBAR LAMINECTOMY/DECOMPRESSION MICRODISCECTOMY Left 06/05/2021   Procedure: Left lumbar one-two, Lumbar two-three Laminectomy;  Surgeon: Kristeen Miss, MD;  Location: [facility];  Service: Neurosurgery;  Laterality: Left;  . MENISCUS REPAIR     Right knee  . MINOR APPLICATION OF WOUND VAC Left 02/20/2021   Procedure: MINOR APPLICATION OF KERLIX PACKING/REMOVAL OF WOUND VAC;  Surgeon: Johnathan Hausen, MD;  [facility];  Service: General;  Laterality: Left;  . TEE WITHOUT CARDIOVERSION N/A 02/05/2021   Procedure: TRANSESOPHAGEAL ECHOCARDIOGRAM (TEE);  Surgeon: Sueanne Margarita, MD;  Location: [facility];  Service: Cardiovascular;  Laterality: N/A;  . 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 TOTAL SHOULDER ARTHROPLASTY Right 02/10/2021   Procedure: RIGHT OPEN SHOULDER I & D, OPEN DISTAL CLAVICLE RESECTION;  Surgeon: Marchia Bond, MD;  [facility];  Service: Orthopedics;  Laterality: Right;  Marland Kitchen VASECTOMY       Social History   Tobacco Use  . Smoking status: Former    Packs/day: 2.00    Years: 15.00    Additional pack years: 0.00    Total pack years: 30.00    Types: Cigarettes    Quit date: 2012    Years since quitting: 12.2  . Smokeless tobacco: Never  Vaping Use  . Vaping Use: Never used  Substance Use Topics  . Alcohol use: Yes    Comment: occasional  . Drug use: No    Family History  Problem Relation Age of Onset  . Cancer Mother   . Colon cancer Neg Hx     Allergies  Allergen Reactions  . Tizanidine Other (See Comments)    Leg cramps    Health Maintenance  Topic Date Due  . Zoster Vaccines- Shingrix (1 of 2) Never done  . COVID-19 Vaccine (3 - 2023-24 season) 04/26/2022  . FOOT EXAM  11/17/2022  . OPHTHALMOLOGY EXAM  11/17/2022  . INFLUENZA VACCINE  11/24/2022  (Originally 03/26/2022)  . Lung Cancer Screening  08/17/2023 (Originally 02/26/2022)  . HEMOGLOBIN A1C  02/15/2023  . Diabetic kidney evaluation - Urine ACR  08/17/2023  . Diabetic kidney evaluation - eGFR measurement  09/26/2023  . Fecal DNA (Cologuard)  11/03/2023  . DTaP/Tdap/Td (2 - Td or Tdap) 08/14/2024  . Hepatitis C Screening  Completed  . HIV Screening  Completed  . HPV VACCINES  Aged Out    Objective: Wt 183 lb 3.2 oz (83.1 kg)   BMI 26.29 kg/m    Physical Exam Constitutional:  older than stated age, extremely malnourished   HENT:     Head: Normocephalic and atraumatic.      Mouth: Mucous membranes are moist.  Eyes:    Conjunctiva/sclera: Conjunctivae normal.     Pupils:   Cardiovascular:     Rate and Rhythm: Normal rate and regular rhythm.     Heart sounds:  Pulmonary:     Effort: Pulmonary effort is normal.     Breath sounds: Normal breath sounds.   Abdominal:     General: Non distended     Palpations: soft.   Musculoskeletal:        General: in wheel chair,  Power of Rt LE 4 to 4+/5 Power of Lt LE 4 to 4+/5  Skin:    General: Skin is warm and dry.     Comments: generalized chronic appearing maculopapular rashes in his torso, upper and lower extremities - resolved   Neurological:     General: grossly non focal     Mental Status: awake, alert and oriented to person, place, and time.   Psychiatric:        Mood and Affect: Mood normal.   Lab Results Lab Results  Component Value Date   WBC 7.3 09/25/2022   HGB 13.2 09/25/2022   HCT 37.7 (L) 09/25/2022   MCV 79.2 (L) 09/25/2022   PLT 416 (H) 09/25/2022    Lab Results  Component Value Date   CREATININE 0.63 (L) 09/25/2022   BUN 9 09/25/2022   NA 137 09/25/2022   K 4.7 09/25/2022   CL 100 09/25/2022   CO2 27 09/25/2022    Lab Results  Component Value Date   ALT 18 05/09/2022   AST 14 (L) 05/09/2022   ALKPHOS 92 05/09/2022   BILITOT 0.5 05/09/2022    Lab Results  Component Value Date    CHOL 146  11/08/2021   HDL 43 11/08/2021   LDLCALC 85 11/08/2021   TRIG 97 11/08/2021   CHOLHDL 3.4 11/08/2021   No results found for: "LABRPR", "RPRTITER" No results found for: "HIV1RNAQUANT", "HIV1RNAVL", "CD4TABS"   Microbiology  Results for orders placed or performed during the hospital encounter of 04/22/22  Blood culture (routine x 2)     Status: None   Collection Time: 04/22/22  2:52 PM   Specimen: BLOOD  Result Value Ref Range Status   Specimen Description BLOOD SITE NOT SPECIFIED  Final   Special Requests   Final    BOTTLES DRAWN AEROBIC AND ANAEROBIC Blood Culture adequate volume   Culture   Final    NO GROWTH 5 DAYS Performed at [facility], [address], [place], [place] [address]    Report Status 04/27/2022 FINAL  Final  Blood culture (routine x 2)     Status: Abnormal   Collection Time: 04/22/22  7:24 PM   Specimen: BLOOD  Result Value Ref Range Status   Specimen Description BLOOD LEFT ANTECUBITAL  Final   Special Requests   Final    BOTTLES DRAWN AEROBIC AND ANAEROBIC Blood Culture results may not be optimal due to an excessive volume of blood received in culture bottles   Culture  Setup Time   Final    GRAM POSITIVE COCCI IN CLUSTERS IN BOTH AEROBIC AND ANAEROBIC BOTTLES CRITICAL RESULT CALLED TO, READ BACK BY AND VERIFIED WITH: PHARMD LISA [place] ON 04/23/22 @ 1951 BY DRT    Culture (A)  Final    STAPHYLOCOCCUS PSEUDINTERMEDIUS STAPHYLOCOCCUS EPIDERMIDIS THE SIGNIFICANCE OF ISOLATING THIS ORGANISM FROM A SINGLE SET OF BLOOD CULTURES WHEN MULTIPLE SETS ARE DRAWN IS UNCERTAIN. PLEASE NOTIFY THE MICROBIOLOGY DEPARTMENT WITHIN ONE WEEK IF SPECIATION AND SENSITIVITIES ARE REQUIRED. Performed at [facility], [place] [address] [place], [place] [address]    Report Status 04/25/2022 FINAL  Final  Blood Culture ID Panel (Reflexed)     Status: Abnormal   Collection Time: 04/22/22  7:24 PM  Result Value Ref Range Status   Enterococcus faecalis NOT  DETECTED NOT DETECTED Final   Enterococcus Faecium NOT DETECTED NOT DETECTED Final   Listeria monocytogenes NOT DETECTED NOT DETECTED Final   Staphylococcus species DETECTED (A) NOT DETECTED Final    Comment: CRITICAL RESULT CALLED TO, READ BACK BY AND VERIFIED WITH: PHARMD LISA CURRAN ON 04/23/22 @ 1951 BY DRT    Staphylococcus aureus (BCID) NOT DETECTED NOT DETECTED Final   Staphylococcus epidermidis DETECTED (A) NOT DETECTED Final    Comment: Methicillin (oxacillin) resistant coagulase negative staphylococcus. Possible blood culture contaminant (unless isolated from more than one blood culture draw or clinical case suggests pathogenicity). No antibiotic treatment is indicated for blood  culture contaminants. CRITICAL RESULT CALLED TO, READ BACK BY AND VERIFIED WITH: PHARMD LISA CURRAN ON 04/23/22 @ 1951 BY DRT    Staphylococcus lugdunensis NOT DETECTED NOT DETECTED Final   Streptococcus species NOT DETECTED NOT DETECTED Final   Streptococcus agalactiae NOT DETECTED NOT DETECTED Final   Streptococcus pneumoniae NOT DETECTED NOT DETECTED Final   Streptococcus pyogenes NOT DETECTED NOT DETECTED Final   A.calcoaceticus-baumannii NOT DETECTED NOT DETECTED Final   Bacteroides fragilis NOT DETECTED NOT DETECTED Final   Enterobacterales NOT DETECTED NOT DETECTED Final   Enterobacter cloacae complex NOT DETECTED NOT DETECTED Final   Escherichia coli NOT DETECTED NOT DETECTED Final   Klebsiella aerogenes NOT DETECTED NOT DETECTED Final   Klebsiella oxytoca NOT DETECTED NOT DETECTED  Final   Klebsiella pneumoniae NOT DETECTED NOT DETECTED Final   Proteus species NOT DETECTED NOT DETECTED Final   Salmonella species NOT DETECTED NOT DETECTED Final   Serratia marcescens NOT DETECTED NOT DETECTED Final   Haemophilus influenzae NOT DETECTED NOT DETECTED Final   Neisseria meningitidis NOT DETECTED NOT DETECTED Final   Pseudomonas aeruginosa NOT DETECTED NOT DETECTED Final   Stenotrophomonas  maltophilia NOT DETECTED NOT DETECTED Final   Candida albicans NOT DETECTED NOT DETECTED Final   Candida auris NOT DETECTED NOT DETECTED Final   Candida glabrata NOT DETECTED NOT DETECTED Final   Candida krusei NOT DETECTED NOT DETECTED Final   Candida parapsilosis NOT DETECTED NOT DETECTED Final   Candida tropicalis NOT DETECTED NOT DETECTED Final   Cryptococcus neoformans/gattii NOT DETECTED NOT DETECTED Final   Methicillin resistance mecA/C DETECTED (A) NOT DETECTED Final    Comment: CRITICAL RESULT CALLED TO, READ BACK BY AND VERIFIED WITH: PHARMD LISA CURRAN ON 04/23/22 @ 1951 BY DRT Performed at [facility], [address] [place], [place] [address]   Body fluid culture w Gram Stain     Status: Abnormal   Collection Time: 04/24/22 11:37 AM   Specimen: PATH Disc; Body Fluid  Result Value Ref Range Status   Specimen Description FLUID  Final   Special Requests  DISC  Final   Gram Stain   Final    RARE WBC PRESENT, PREDOMINANTLY PMN RARE GRAM VARIABLE ROD Performed at [facility] [address] [place], [place] [address]    Culture PSEUDOMONAS AERUGINOSA (A)  Final   Report Status 04/26/2022 FINAL  Final   Organism ID, Bacteria PSEUDOMONAS AERUGINOSA  Final      Susceptibility   Pseudomonas aeruginosa - MIC*    CEFTAZIDIME 4 SENSITIVE Sensitive     CIPROFLOXACIN <=0.25 SENSITIVE Sensitive     GENTAMICIN <=1 SENSITIVE Sensitive     IMIPENEM 1 SENSITIVE Sensitive     PIP/TAZO 8 SENSITIVE Sensitive     CEFEPIME 2 SENSITIVE Sensitive     * PSEUDOMONAS AERUGINOSA  Aerobic/Anaerobic Culture w Gram Stain (surgical/deep wound)     Status: None   Collection Time: 04/25/22 12:10 PM   Specimen: Abscess  Result Value Ref Range Status   Specimen Description ABSCESS  Final   Special Requests NONE  Final   Gram Stain   Final    FEW WBC PRESENT, PREDOMINANTLY PMN NO ORGANISMS SEEN    Culture   Final    FEW PSEUDOMONAS AERUGINOSA NO ANAEROBES ISOLATED Performed at  [facility], [place] [address] [place], [place] [address]    Report Status 04/30/2022 FINAL  Final   Organism ID, Bacteria PSEUDOMONAS AERUGINOSA  Final      Susceptibility   Pseudomonas aeruginosa - MIC*    CEFTAZIDIME 4 SENSITIVE Sensitive     CIPROFLOXACIN <=0.25 SENSITIVE Sensitive     GENTAMICIN <=1 SENSITIVE Sensitive     IMIPENEM 1 SENSITIVE Sensitive     PIP/TAZO 8 SENSITIVE Sensitive     CEFEPIME 2 SENSITIVE Sensitive     * FEW PSEUDOMONAS AERUGINOSA  Surgical pcr screen     Status: None   Collection Time: 05/02/22 10:33 AM   Specimen: Nasal Mucosa; Nasal Swab  Result Value Ref Range Status   MRSA, PCR NEGATIVE NEGATIVE Final   Staphylococcus aureus NEGATIVE NEGATIVE Final    Comment: (NOTE) The Xpert SA Assay (FDA approved for NASAL specimens in patients 84 years of age and older),  is one component of a comprehensive surveillance program. It is not intended to diagnose infection nor to guide or monitor treatment. Performed at [facility], [place] [address], [place], [place] [address]   Aerobic/Anaerobic Culture w Gram Stain (surgical/deep wound)     Status: None   Collection Time: 05/02/22  4:41 PM   Specimen: PATH Soft tissue  Result Value Ref Range Status   Specimen Description TISSUE  Final   Special Requests SQ TISSUE  Final   Gram Stain NO WBC SEEN NO ORGANISMS SEEN   Final   Culture   Final    No growth aerobically or anaerobically. Performed at [facility], [place] [address], [place], [place] [address]    Report Status 05/07/2022 FINAL  Final  Aerobic/Anaerobic Culture w Gram Stain (surgical/deep wound)     Status: None   Collection Time: 05/02/22  5:18 PM   Specimen: PATH Soft tissue  Result Value Ref Range Status   Specimen Description TISSUE  Final   Special Requests THORACIC 9 AND 10 LAMINECTOMY  Final   Gram Stain NO WBC SEEN NO ORGANISMS SEEN   Final   Culture   Final    No growth aerobically or anaerobically. Performed at [facility], [place] [address], [place], [place] [address]    Report Status 05/07/2022 FINAL  Final   Imaging No results found.  Assessment/Plan  # T9-T12 osteomyelitis  # left T11-T12 septic arthritis # epidural phlegmon/abscess extending from t9-t12 resulting in severe T10-T11 severe spinal canal stenosis Completed IV cefepime on 10/19, has not been on ciprofloxacin since then ( has missed several appts) Started on ciprofloxacin 750mg  po bid given elevated inflammatory markers 07/03/22. EKG 11/8 qtc 410  Continue ciprofloxacin 750 mg po bid as is. He has adequate refills Will plan to eventually stop ciprofloxacin if continues to do well clinically and improvement in inflammatory markers. He does not have a hardware placed in T spine in last OR.  Fu in 3 months    # Medication Monitoring  Labs today  [phone] today  # Lumbar Osteomyelitis complicated with hardware Continue po doxycycline indefinitely  I have personally spent 40 minutes involved in face-to-face and non-face-to-face activities for this patient on the day of the visit. Professional time spent includes the following activities: Preparing to see the patient (review of tests), Obtaining and/or reviewing separately obtained history (admission/discharge record), Performing a medically appropriate examination and/or evaluation , Ordering medications/tests/procedures, referring and communicating with other health care professionals, Documenting clinical information in the EMR, Independently interpreting results (not separately reported), Communicating results to the patient/family/caregiver, Counseling and educating the patient/family/caregiver and Care coordination (not separately reported).   Wilber Oliphant, [facility] 11/20/2022, 2:22 PM

## 2022-11-21 LAB — BASIC METABOLIC PANEL
BUN: 9 mg/dL (ref 7–25)
CO2: 27 mmol/L (ref 20–32)
Calcium: 10 mg/dL (ref 8.6–10.3)
Chloride: 97 mmol/L — ABNORMAL LOW (ref 98–110)
Creat: 0.75 mg/dL (ref 0.70–1.30)
Glucose, Bld: 139 mg/dL — ABNORMAL HIGH (ref 65–99)
Potassium: 4.5 mmol/L (ref 3.5–5.3)
Sodium: 136 mmol/L (ref 135–146)

## 2022-11-21 LAB — CBC
HCT: 40.7 % (ref 38.5–50.0)
Hemoglobin: 14.2 g/dL (ref 13.2–17.1)
MCH: 28.7 pg (ref 27.0–33.0)
MCHC: 34.9 g/dL (ref 32.0–36.0)
MCV: 82.2 fL (ref 80.0–100.0)
MPV: 9.5 fL (ref 7.5–12.5)
Platelets: 424 10*3/uL — ABNORMAL HIGH (ref 140–400)
RBC: 4.95 10*6/uL (ref 4.20–5.80)
RDW: 14.2 % (ref 11.0–15.0)
WBC: 8.3 10*3/uL (ref 3.8–10.8)

## 2022-11-21 LAB — SEDIMENTATION RATE: Sed Rate: 38 mm/h — ABNORMAL HIGH (ref 0–20)

## 2022-11-21 LAB — C-REACTIVE PROTEIN: CRP: 43.6 mg/L — ABNORMAL HIGH (ref <8.0)

## 2022-12-03 ENCOUNTER — Inpatient Hospital Stay (HOSPITAL_COMMUNITY)
Admission: EM | Admit: 2022-12-03 | Discharge: 2022-12-09 | DRG: 988 | Disposition: A | Discharge status: Home Health | Payer: Commercial Managed Care - PPO | Attending: Internal Medicine | Admitting: Internal Medicine

## 2022-12-03 ENCOUNTER — Emergency Department (HOSPITAL_COMMUNITY): Payer: Commercial Managed Care - PPO

## 2022-12-03 ENCOUNTER — Encounter (HOSPITAL_COMMUNITY): Payer: Self-pay

## 2022-12-03 ENCOUNTER — Other Ambulatory Visit: Payer: Self-pay

## 2022-12-03 DIAGNOSIS — D75838 Other thrombocytosis: Secondary | ICD-10-CM | POA: Diagnosis present

## 2022-12-03 DIAGNOSIS — B957 Other staphylococcus as the cause of diseases classified elsewhere: Secondary | ICD-10-CM | POA: Diagnosis present

## 2022-12-03 DIAGNOSIS — M4626 Osteomyelitis of vertebra, lumbar region: Secondary | ICD-10-CM | POA: Diagnosis present

## 2022-12-03 DIAGNOSIS — Z79899 Other long term (current) drug therapy: Secondary | ICD-10-CM

## 2022-12-03 DIAGNOSIS — Z888 Allergy status to other drugs, medicaments and biological substances status: Secondary | ICD-10-CM | POA: Diagnosis not present

## 2022-12-03 DIAGNOSIS — M8668 Other chronic osteomyelitis, other site: Secondary | ICD-10-CM | POA: Diagnosis not present

## 2022-12-03 DIAGNOSIS — M869 Osteomyelitis, unspecified: Secondary | ICD-10-CM | POA: Diagnosis present

## 2022-12-03 DIAGNOSIS — M4644 Discitis, unspecified, thoracic region: Secondary | ICD-10-CM | POA: Diagnosis present

## 2022-12-03 DIAGNOSIS — E785 Hyperlipidemia, unspecified: Secondary | ICD-10-CM | POA: Diagnosis present

## 2022-12-03 DIAGNOSIS — M545 Low back pain, unspecified: Secondary | ICD-10-CM

## 2022-12-03 DIAGNOSIS — M4624 Osteomyelitis of vertebra, thoracic region: Secondary | ICD-10-CM | POA: Diagnosis present

## 2022-12-03 DIAGNOSIS — M549 Dorsalgia, unspecified: Secondary | ICD-10-CM | POA: Diagnosis not present

## 2022-12-03 DIAGNOSIS — I10 Essential (primary) hypertension: Secondary | ICD-10-CM | POA: Diagnosis present

## 2022-12-03 DIAGNOSIS — F1120 Opioid dependence, uncomplicated: Secondary | ICD-10-CM | POA: Diagnosis present

## 2022-12-03 DIAGNOSIS — E1169 Type 2 diabetes mellitus with other specified complication: Secondary | ICD-10-CM | POA: Diagnosis not present

## 2022-12-03 DIAGNOSIS — G8929 Other chronic pain: Secondary | ICD-10-CM | POA: Diagnosis present

## 2022-12-03 DIAGNOSIS — M4620 Osteomyelitis of vertebra, site unspecified: Secondary | ICD-10-CM | POA: Diagnosis not present

## 2022-12-03 DIAGNOSIS — Z8619 Personal history of other infectious and parasitic diseases: Secondary | ICD-10-CM | POA: Diagnosis not present

## 2022-12-03 DIAGNOSIS — Z87891 Personal history of nicotine dependence: Secondary | ICD-10-CM | POA: Diagnosis not present

## 2022-12-03 DIAGNOSIS — Z96611 Presence of right artificial shoulder joint: Secondary | ICD-10-CM | POA: Diagnosis present

## 2022-12-03 DIAGNOSIS — M4625 Osteomyelitis of vertebra, thoracolumbar region: Secondary | ICD-10-CM | POA: Diagnosis present

## 2022-12-03 DIAGNOSIS — Z86711 Personal history of pulmonary embolism: Secondary | ICD-10-CM | POA: Diagnosis not present

## 2022-12-03 LAB — COMPREHENSIVE METABOLIC PANEL
ALT: 18 U/L (ref 0–44)
AST: 24 U/L (ref 15–41)
Albumin: 3.7 g/dL (ref 3.5–5.0)
Alkaline Phosphatase: 123 U/L (ref 38–126)
Anion gap: 13 (ref 5–15)
BUN: 7 mg/dL (ref 6–20)
CO2: 27 mmol/L (ref 22–32)
Calcium: 9.2 mg/dL (ref 8.9–10.3)
Chloride: 100 mmol/L (ref 98–111)
Creatinine, Ser: 0.72 mg/dL (ref 0.61–1.24)
GFR, Estimated: >60 mL/min (ref >60)
Glucose, Bld: 213 mg/dL — ABNORMAL HIGH (ref 70–99)
Potassium: 3.9 mmol/L (ref 3.5–5.1)
Sodium: 140 mmol/L (ref 135–145)
Total Bilirubin: 0.5 mg/dL (ref 0.3–1.2)
Total Protein: 7.6 g/dL (ref 6.5–8.1)

## 2022-12-03 LAB — CBC WITH DIFFERENTIAL/PLATELET
Abs Immature Granulocytes: 0.03 10*3/uL (ref 0.00–0.07)
Basophils Absolute: 0 10*3/uL (ref 0.0–0.1)
Basophils Relative: 0 %
Eosinophils Absolute: 0 10*3/uL (ref 0.0–0.5)
Eosinophils Relative: 0 %
HCT: 40.4 % (ref 39.0–52.0)
Hemoglobin: 13.2 g/dL (ref 13.0–17.0)
Immature Granulocytes: 0 %
Lymphocytes Relative: 14 %
Lymphs Abs: 1.5 10*3/uL (ref 0.7–4.0)
MCH: 27.8 pg (ref 26.0–34.0)
MCHC: 32.7 g/dL (ref 30.0–36.0)
MCV: 85.1 fL (ref 80.0–100.0)
Monocytes Absolute: 0.7 10*3/uL (ref 0.1–1.0)
Monocytes Relative: 7 %
Neutro Abs: 8.4 10*3/uL — ABNORMAL HIGH (ref 1.7–7.7)
Neutrophils Relative %: 79 %
Platelets: 475 10*3/uL — ABNORMAL HIGH (ref 150–400)
RBC: 4.75 MIL/uL (ref 4.22–5.81)
RDW: 14.6 % (ref 11.5–15.5)
WBC: 10.7 10*3/uL — ABNORMAL HIGH (ref 4.0–10.5)
nRBC: 0 % (ref 0.0–0.2)

## 2022-12-03 LAB — URINALYSIS, ROUTINE W REFLEX MICROSCOPIC
Bilirubin Urine: NEGATIVE
Glucose, UA: NEGATIVE mg/dL
Hgb urine dipstick: NEGATIVE
Ketones, ur: NEGATIVE mg/dL
Leukocytes,Ua: NEGATIVE
Nitrite: NEGATIVE
Protein, ur: NEGATIVE mg/dL
Specific Gravity, Urine: >1.046 — ABNORMAL HIGH (ref 1.005–1.030)
pH: 6 (ref 5.0–8.0)

## 2022-12-03 LAB — C-REACTIVE PROTEIN: CRP: 3.2 mg/dL — ABNORMAL HIGH (ref <1.0)

## 2022-12-03 LAB — HIV ANTIBODY (ROUTINE TESTING W REFLEX): HIV Screen 4th Generation wRfx: NONREACTIVE

## 2022-12-03 LAB — SEDIMENTATION RATE: Sed Rate: 19 mm/hr — ABNORMAL HIGH (ref 0–16)

## 2022-12-03 LAB — LIPASE, BLOOD: Lipase: 99 U/L — ABNORMAL HIGH (ref 11–51)

## 2022-12-03 MED ORDER — OXYCODONE-ACETAMINOPHEN 10-325 MG PO TABS: 1.0000 | ORAL_TABLET | Freq: Four times a day (QID) | ORAL | Status: DC | PRN

## 2022-12-03 MED ORDER — BACLOFEN 10 MG PO TABS
5.0000 mg | ORAL_TABLET | Freq: Three times a day (TID) | ORAL | Status: DC
  Administered 2022-12-03 – 2022-12-09 (×18): 5 mg via ORAL
  Filled 2022-12-03 (×18): qty 1

## 2022-12-03 MED ORDER — MUPIROCIN 2 % EX OINT
1.0000 | TOPICAL_OINTMENT | Freq: Two times a day (BID) | CUTANEOUS | Status: AC
  Administered 2022-12-03 – 2022-12-08 (×7): 1 via NASAL
  Filled 2022-12-03: qty 22

## 2022-12-03 MED ORDER — HYDROMORPHONE HCL 1 MG/ML IJ SOLN
2.0000 mg | Freq: Once | INTRAMUSCULAR | Status: AC
  Administered 2022-12-03: 2 mg via INTRAVENOUS
  Filled 2022-12-03: qty 2

## 2022-12-03 MED ORDER — SENNOSIDES-DOCUSATE SODIUM 8.6-50 MG PO TABS: 1.0000 | ORAL_TABLET | Freq: Every evening | ORAL | Status: DC | PRN

## 2022-12-03 MED ORDER — ONDANSETRON HCL 4 MG/2ML IJ SOLN
4.0000 mg | Freq: Once | INTRAMUSCULAR | Status: AC
  Administered 2022-12-03: 4 mg via INTRAVENOUS
  Filled 2022-12-03: qty 2

## 2022-12-03 MED ORDER — OXYCODONE-ACETAMINOPHEN 5-325 MG PO TABS: 1.0000 | ORAL_TABLET | Freq: Four times a day (QID) | ORAL | Status: DC | PRN

## 2022-12-03 MED ORDER — GABAPENTIN 400 MG PO CAPS
400.0000 mg | ORAL_CAPSULE | Freq: Two times a day (BID) | ORAL | Status: DC
  Administered 2022-12-03 – 2022-12-09 (×13): 400 mg via ORAL
  Filled 2022-12-03 (×13): qty 1

## 2022-12-03 MED ORDER — ACETAMINOPHEN 500 MG PO TABS
500.0000 mg | ORAL_TABLET | Freq: Four times a day (QID) | ORAL | Status: DC | PRN
  Administered 2022-12-08: 500 mg via ORAL
  Filled 2022-12-03: qty 1

## 2022-12-03 MED ORDER — MORPHINE SULFATE ER 15 MG PO TBCR
15.0000 mg | EXTENDED_RELEASE_TABLET | Freq: Two times a day (BID) | ORAL | Status: DC
  Administered 2022-12-03 – 2022-12-07 (×8): 15 mg via ORAL
  Filled 2022-12-03 (×8): qty 1

## 2022-12-03 MED ORDER — DULOXETINE HCL 60 MG PO CPEP
60.0000 mg | ORAL_CAPSULE | Freq: Every day | ORAL | Status: DC
  Administered 2022-12-03 – 2022-12-08 (×6): 60 mg via ORAL
  Filled 2022-12-03 (×6): qty 1

## 2022-12-03 MED ORDER — HYDROMORPHONE HCL 1 MG/ML IJ SOLN
0.5000 mg | INTRAMUSCULAR | Status: DC | PRN
  Administered 2022-12-04 – 2022-12-07 (×8): 1 mg via INTRAVENOUS
  Filled 2022-12-03 (×10): qty 1

## 2022-12-03 MED ORDER — ENOXAPARIN SODIUM 40 MG/0.4ML IJ SOSY
40.0000 mg | PREFILLED_SYRINGE | INTRAMUSCULAR | Status: DC
  Administered 2022-12-03 – 2022-12-08 (×6): 40 mg via SUBCUTANEOUS
  Filled 2022-12-03 (×6): qty 0.4

## 2022-12-03 MED ORDER — IOHEXOL 350 MG/ML SOLN
100.0000 mL | Freq: Once | INTRAVENOUS | Status: AC | PRN
  Administered 2022-12-03: 100 mL via INTRAVENOUS

## 2022-12-03 MED ORDER — BISACODYL 5 MG PO TBEC
5.0000 mg | DELAYED_RELEASE_TABLET | Freq: Every day | ORAL | Status: DC | PRN
  Administered 2022-12-08: 5 mg via ORAL
  Filled 2022-12-03: qty 1

## 2022-12-03 MED ORDER — MORPHINE SULFATE (PF) 4 MG/ML IV SOLN
4.0000 mg | Freq: Once | INTRAVENOUS | Status: AC
  Administered 2022-12-03: 4 mg via INTRAVENOUS
  Filled 2022-12-03: qty 1

## 2022-12-03 MED ORDER — ONDANSETRON HCL 4 MG/2ML IJ SOLN
4.0000 mg | Freq: Four times a day (QID) | INTRAMUSCULAR | Status: AC | PRN
  Administered 2022-12-03 – 2022-12-04 (×2): 4 mg via INTRAVENOUS
  Filled 2022-12-03 (×2): qty 2

## 2022-12-03 MED ORDER — OXYCODONE HCL 5 MG PO TABS
5.0000 mg | ORAL_TABLET | Freq: Four times a day (QID) | ORAL | Status: DC | PRN
  Administered 2022-12-03: 5 mg via ORAL
  Filled 2022-12-03: qty 1

## 2022-12-03 NOTE — ED Triage Notes (Signed)
Pt states he is having back pain that started on Friday. States it is "in the middle". Pt has been taking his Percocet for the pain with no relief.

## 2022-12-03 NOTE — ED Provider Notes (Signed)
[place] EMERGENCY DEPARTMENT AT [facility] Provider Note   CSN: [id number] Arrival date & time: 12/03/22  9562     History  Chief Complaint  Patient presents with   Back Pain    DAN SCEARCE is a 57 y.o. male with past medical history hypertension, diabetes, spinal stenosis who presents to the ED complaining of mid to lower back pain that started about 4 days ago.  He reports that this pain goes through to his back to his epigastric abdomen and is associated with nausea and 1 episode of vomiting this morning.  Patient denies chest pain, shortness of breath, fever, chills, diarrhea., saddle anesthesia, lower extremity weakness, bowel or bladder dysfunction, dysuria, hematuria, or other associated symptoms.  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  Reports this was first diagnosed in March 2023 and then again in September 2023 which was the last time he was hospitalized.  Patient states that he is on daily antibiotics following this including cipro and doxycycline. He is also on Percocet for pain but reports no relief with this.       Home Medications Prior to Admission medications   Medication Sig Start Date End Date Taking? Authorizing Provider  acetaminophen (TYLENOL) 500 MG tablet Take 1 tablet (500 mg total) by mouth every 6 (six) hours as needed. 06/26/21   Love, Evlyn Kanner, PA-C  Baclofen 5 MG TABS Take 1 tablet by mouth 3 (three) times daily. Patient not taking: Reported on 08/16/2022 05/16/22   Love, Evlyn Kanner, PA-C  Baclofen 5 MG TABS Take 1 tablet by mouth 3 (three) times daily. 08/21/22     Baclofen 5 MG TABS Take 1 tablet (5 mg total) by mouth 3 (three) times daily. 11/13/22     ciprofloxacin (CIPRO) 250 MG tablet Take 1 tablet (250 mg total) by mouth 2 (two) times daily. 09/26/22   Odette Fraction, MD  ciprofloxacin (CIPRO) 500 MG tablet Take 1  tablet (500 mg total) by mouth 2 (two) times daily. 09/26/22   Odette Fraction, MD  Continuous Blood Gluc Sensor (FREESTYLE LIBRE 2 SENSOR) MISC CHECK BLOOD SUGAR 4 TIMES A DAY 08/22/22   Hawks, Neysa Bonito A, FNP  doxycycline (VIBRA-TABS) 100 MG tablet Take 1 tablet (100 mg total) by mouth 2 (two) times daily. 07/03/22   Odette Fraction, MD  DULoxetine (CYMBALTA) 60 MG capsule Take 1 capsule (60 mg total) by mouth at bedtime. Patient not taking: Reported on 08/16/2022 05/16/22   Love, Evlyn Kanner, PA-C  DULoxetine (CYMBALTA) 60 MG capsule Take 1 capsule (60 mg total) by mouth at bedtime. 11/13/22     gabapentin (NEURONTIN) 400 MG capsule Take 1 capsule (400 mg total) by mouth 2 (two) times daily. 08/16/22   Junie Spencer, FNP  hydrOXYzine (ATARAX) 10 MG tablet Take 1 tablet (10 mg total) by mouth 3 (three) times daily. Patient not taking: Reported on 08/16/2022 05/16/22   Love, Evlyn Kanner, PA-C  hydrOXYzine (ATARAX) 10 MG tablet Take 1 tablet (10 mg total) by mouth 3 (three) times daily. 08/21/22     hydrOXYzine (ATARAX) 10 MG tablet Take 1 tablet (10 mg total) by mouth 3 (three) times daily. 11/13/22     methadone (DOLOPHINE) 5 MG tablet Take 3 tablets (15 mg total) by mouth every 12 (twelve) hours. Patient not taking: Reported on 08/16/2022 05/16/22   Love, Evlyn Kanner, PA-C  morphine (MS CONTIN) 15 MG  12 hr tablet Take 15 mg by mouth 3 (three) times daily. 06/25/22   [provider]  morphine (MS CONTIN) 30 MG 12 hr tablet Take 1 tablet by mouth every twelve hours 11/13/22     naloxone (NARCAN) 0.4 MG/ML injection Inject 1 mL (0.4 mg total) into the vein as needed. Patient not taking: Reported on 08/16/2022 06/26/21   Love, Evlyn Kanner, PA-C  oxyCODONE-acetaminophen (PERCOCET) 10-325 MG tablet Take 1 tablet by mouth every 6 (six) hours as needed (breakthrough pain). 05/17/22   Love, Evlyn Kanner, PA-C  oxyCODONE-acetaminophen (PERCOCET) 10-325 MG tablet Take 1 tablet by mouth every 6 (six) hours. Stop taking  methadone. 05/23/22     oxyCODONE-acetaminophen (PERCOCET) 10-325 MG tablet Take 1 tablet by mouth 5 (five) times daily. 07/24/22     oxyCODONE-acetaminophen (PERCOCET) 10-325 MG tablet Take 1 tablet by mouth 5 (five) times daily. 08/21/22     oxyCODONE-acetaminophen (PERCOCET) 10-325 MG tablet Take 1 tablet by mouth 5 (five) times daily. 09/18/22     oxyCODONE-acetaminophen (PERCOCET) 10-325 MG tablet Take 1 tablet by mouth 5 (five) times daily. 10/16/22     oxyCODONE-acetaminophen (PERCOCET) 10-325 MG tablet Take 1 tablet by mouth five times a day 11/13/22         Allergies    Tizanidine    Review of Systems   Review of Systems  All other systems reviewed and are negative.   Physical Exam Updated Vital Signs BP (!) 144/98 (BP Location: Right Arm)   Pulse 96   Temp 98.2 F (36.8 C) (Oral)   Resp 18   SpO2 100%  Physical Exam Vitals and nursing note reviewed.  Constitutional:      General: He is not in acute distress.    Appearance: Normal appearance. He is not ill-appearing, toxic-appearing or diaphoretic.  HENT:     Head: Normocephalic and atraumatic.     Mouth/Throat:     Mouth: Mucous membranes are moist.     Pharynx: Oropharynx is clear.  Eyes:     General: No scleral icterus.    Extraocular Movements: Extraocular movements intact.     Conjunctiva/sclera: Conjunctivae normal.     Pupils: Pupils are equal, round, and reactive to light.  Cardiovascular:     Rate and Rhythm: Normal rate and regular rhythm.     Heart sounds: No murmur heard. Pulmonary:     Effort: Pulmonary effort is normal.     Breath sounds: Normal breath sounds.  Abdominal:     General: Abdomen is flat. There is no distension.     Palpations: Abdomen is soft. There is no mass.     Tenderness: There is abdominal tenderness (moderate epigastric). There is no right CVA tenderness, left CVA tenderness, guarding or rebound.     Hernia: No hernia is present.  Musculoskeletal:     Cervical back: Normal  range of motion and neck supple. No rigidity or tenderness.     Right lower leg: No edema.     Left lower leg: No edema.     Comments: Diffuse midline tenderness to the lumbar spine, no step-offs or deformities, no overlying skin changes, pain elicited with minimal range of motion  Skin:    General: Skin is warm and dry.     Capillary Refill: Capillary refill takes less than 2 seconds.  Neurological:     General: No focal deficit present.     Mental Status: He is alert and oriented to person, place, and time.  GCS: GCS eye subscore is 4. GCS verbal subscore is 5. GCS motor subscore is 6.     Cranial Nerves: Cranial nerves 2-12 are intact. No cranial nerve deficit, dysarthria or facial asymmetry.     Sensory: Sensation is intact.     Motor: Motor function is intact. No weakness, tremor, atrophy, abnormal muscle tone or seizure activity.     Coordination: Coordination is intact.     Gait: Gait is intact.  Psychiatric:        Mood and Affect: Mood normal.        Behavior: Behavior normal.     ED Results / Procedures / Treatments   Labs (all labs ordered are listed, but only abnormal results are displayed) Labs Reviewed  CBC WITH DIFFERENTIAL/PLATELET - Abnormal; Notable for the following components:      Result Value   WBC 10.7 (*)    Platelets 475 (*)    Neutro Abs 8.4 (*)    All other components within normal limits  COMPREHENSIVE METABOLIC PANEL - Abnormal; Notable for the following components:   Glucose, Bld 213 (*)    All other components within normal limits  LIPASE, BLOOD - Abnormal; Notable for the following components:   Lipase 99 (*)    All other components within normal limits  SEDIMENTATION RATE - Abnormal; Notable for the following components:   Sed Rate 19 (*)    All other components within normal limits  C-REACTIVE PROTEIN - Abnormal; Notable for the following components:   CRP 3.2 (*)    All other components within normal limits  CULTURE, BLOOD (ROUTINE X  2)  CULTURE, BLOOD (ROUTINE X 2)  URINALYSIS, ROUTINE W REFLEX MICROSCOPIC    EKG None  Radiology CT Angio Chest/Abd/Pel for Dissection W and/or W/WO  Result Date: 12/03/2022 CLINICAL DATA:  Mid back pain, concern for aneurysm or dissection EXAM: CT ANGIOGRAPHY CHEST, ABDOMEN AND PELVIS TECHNIQUE: Non-contrast CT of the chest was initially obtained. Multidetector CT imaging through the chest, abdomen and pelvis was performed using the standard protocol during bolus administration of intravenous contrast. Multiplanar reconstructed images and MIPs were obtained and reviewed to evaluate the vascular anatomy. RADIATION DOSE REDUCTION: This exam was performed according to the departmental dose-optimization program which includes automated exposure control, adjustment of the mA and/or kV according to patient size and/or use of iterative reconstruction technique. CONTRAST:  OMNIPAQUE IOHEXOL 350 MG/ML SOLN COMPARISON:  None Available. FINDINGS: CTA CHEST FINDINGS VASCULAR Aorta: Satisfactory opacification of the aorta. Normal contour and caliber of the thoracic aorta. No evidence of aneurysm, dissection, or other acute aortic pathology. Minimal scattered aortic atherosclerosis. Cardiovascular: No evidence of pulmonary embolism on limited non-tailored examination. Normal heart size. No pericardial effusion. Review of the MIP images confirms the above findings. NON VASCULAR Mediastinum/Nodes: No enlarged mediastinal, hilar, or axillary lymph nodes. Thyroid gland, trachea, and esophagus demonstrate no significant findings. Lungs/Pleura: Lungs are clear. No pleural effusion or pneumothorax. Musculoskeletal: Extensive disc degenerative disease and bridging osteophytosis throughout the thoracic and lumbar spine, in keeping with DISH. Paraspinous soft tissue thickening about T10 through T12 with extensive underlying bony sclerosis and erosion, particularly involving the T11-T12 disc space (series 7, image 116,  series 11, image 112). Review of the MIP images confirms the above findings. CTA ABDOMEN AND PELVIS FINDINGS VASCULAR Mildly tortuous abdominal aorta. Otherwise normal contour and caliber of the abdominal aorta. No evidence of aneurysm, dissection, or other acute aortic pathology. Standard branching pattern of the abdominal aorta with solitary  bilateral renal arteries. Review of the MIP images confirms the above findings. NON-VASCULAR Hepatobiliary: No solid liver abnormality is seen. No gallstones, gallbladder wall thickening, or biliary dilatation. Pancreas: Unremarkable. No pancreatic ductal dilatation or surrounding inflammatory changes. Spleen: Normal in size without significant abnormality. Adrenals/Urinary Tract: Adrenal glands are unremarkable. Kidneys are normal, without renal calculi, solid lesion, or hydronephrosis. Bladder is unremarkable. Stomach/Bowel: Stomach is within normal limits. Appendix appears normal. No evidence of bowel wall thickening, distention, or inflammatory changes. Lymphatic: No enlarged abdominal or pelvic lymph nodes. Reproductive: No mass or other significant abnormality. Other: No abdominal wall hernia or abnormality. No ascites. Musculoskeletal: No acute osseous findings. Posterior discectomy, laminectomy, and fusion of L3 through S1. IMPRESSION: 1. Normal contour and caliber of the thoracic and abdominal aorta. No evidence of aneurysm, dissection, or other acute aortic pathology. Minimal scattered aortic atherosclerosis. 2. Paraspinous soft tissue thickening about T10 through T12 with extensive underlying bony sclerosis and erosion, particularly involving the T11-T12 disc space. Findings are consistent with discitis/osteomyelitis and better assessed by same-day MR examination of the thoracic and lumbar spine. Aortic Atherosclerosis (ICD10-I70.0). Electronically Signed   By: Jearld Lesch M.D.   On: 12/03/2022 13:11   MR LUMBAR SPINE WO CONTRAST  Result Date:  12/03/2022 CLINICAL DATA:  Concern for osteomyelitis. EXAM: MRI LUMBAR SPINE WITHOUT CONTRAST TECHNIQUE: Multiplanar, multisequence MR imaging of the lumbar spine was performed. No intravenous contrast was administered. COMPARISON:  MRI T Spine 05/01/22, MRI L Spine 04/22/22 FINDINGS: Limitations: Assessment is somewhat limited due to incomplete exam. Axial T1 weighted sequences were not acquired. Segmentation: There is lumbarization of S1 with a small disc space at S1-S2. Alignment:  Grade 1 anterolisthesis of L5 on S1. Vertebrae: There are postsurgical changes from L3-S1 posterior spinal fusion. Conus medullaris and cauda equina: Conus extends to the L1 level. Conus and cauda equina appear normal. Paraspinal and other soft tissues: There is abnormal T2/stir hyperintense signal surrounding the T11 and T12 vertebral bodies (series 5, image 3, 19). There is a 5.9 x 2.0 x 2.0 cm fluid collection spanning the L4-L5 interspinous space, at the site of prior laminectomy. Abnormal STIR hyperintense signal is also present in the L2-L3 disc space (series 5, image 9), which could suggest early discitis at this level, as well as at L1-L2. Disc levels: Redemonstrated are findings of discitis osteomyelitis in the lower thoracic spine at the T10-T12 vertebral body levels. There is abnormal T2/stir hyperintense signal at these levels, compatible with discitis. There is irregular endplate erosion, compatible with osteomyelitis. There is severe spinal canal stenosis at these levels secondary to the presence of abnormal fluid within the ventral and dorsal epidural space (series 8, image 6), most likely epidural phlegmon. There is an abnormal T2 hyperintense collection in the ventral epidural space at the L1 vertebral body level (series 8, image 15), which is nonspecific, but infection is not excluded. There is severe bilateral neural foraminal stenosis at L2-L3, which is a junctional level. There is severe left and moderate right neural  foraminal stenosis at L3-L4 and L5-S1. Evidence of high-grade spinal canal stenosis. IMPRESSION: Incomplete exam. Axial T1 weighted sequences were not acquired. 1. Findings of discitis osteomyelitis in the lower thoracic spine at the T10-T12 vertebral body levels, with severe spinal canal stenosis at these levels secondary to the presence of abnormal fluid within the ventral and dorsal epidural space, most likely epidural phlegmon. There is an abnormal T2 hyperintense collection in the ventral epidural space at the L1 vertebral body level, which  is nonspecific, but inferior extension of infection is not excluded. These findinsg are unchanged from 05/01/22. 2. Abnormal STIR hyperintense signal in the L1-L2 and L2-L3 disc spaces could suggest early discitis. 3. Severe bilateral neural foraminal stenosis at L2-L3, which is a junctional level. Severe left and moderate right neural foraminal stenosis at L3-L4 and L5-S1. 4. 5.9 x 2.0 x 2.0 cm fluid collection spanning the L4-L5 interspinous space, at the site of prior laminectomy, which could reflect a postoperative seroma, although abscess is not excluded. This is unchanged compared to 04/22/22 Electronically Signed   By: Lorenza Cambridge M.D.   On: 12/03/2022 12:41    Procedures .Critical Care  Performed by: Tonette Lederer, PA-C Authorized by: Tonette Lederer, PA-C   Critical care provider statement:    Critical care time was exclusive of:  Separately billable procedures and treating other patients and teaching time   Critical care was necessary to treat or prevent imminent or life-threatening deterioration of the following conditions:  CNS failure or compromise   Critical care was time spent personally by me on the following activities:  Ordering and performing treatments and interventions, ordering and review of laboratory studies, ordering and review of radiographic studies, discussions with consultants, pulse oximetry, re-evaluation of patient's condition,  evaluation of patient's response to treatment, examination of patient and obtaining history from patient or surrogate   Care discussed with: admitting provider       Medications Ordered in ED Medications  morphine (PF) 4 MG/ML injection 4 mg (4 mg Intravenous Given 12/03/22 1054)  ondansetron (ZOFRAN) injection 4 mg (4 mg Intravenous Given 12/03/22 1054)  iohexol (OMNIPAQUE) 350 MG/ML injection 100 mL (100 mLs Intravenous Contrast Given 12/03/22 1257)  HYDROmorphone (DILAUDID) injection 2 mg (2 mg Intravenous Given 12/03/22 1323)    ED Course/ Medical Decision Making/ A&P                             Medical Decision Making Amount and/or Complexity of Data Reviewed Labs: ordered. Decision-making details documented in ED Course. Radiology: ordered. Decision-making details documented in ED Course. ECG/medicine tests: ordered. Decision-making details documented in ED Course.  Risk Prescription drug management. Decision regarding hospitalization.   Medical Decision Making:   NAKOA GANUS is a 57 y.o. male who presented to the ED today with back pain detailed above.    Patient's presentation is complicated by their history of vertebral osteomyelitis, chronic antibiotic use, hypertension, diabetes.  Patient placed on continuous vitals and telemetry monitoring while in ED which was reviewed periodically.  Complete initial physical exam performed, notably the patient  was in no acute distress but did have midline lumbar spinal tenderness.  No step-offs, deformities, or overlying skin changes.  Pain elicited with range of motion.  Epigastric abdominal tenderness but abdomen is soft and without rebound, guarding, or peritoneal signs.  No palpable abdominal masses.  Regular rate and rhythm.  Lungs clear to auscultation.  No cervical spinal tenderness.  No meningismus.  Patient neurologically intact.    Reviewed and confirmed nursing documentation for past medical history, family history, social  history.    Initial Assessment:   With the patient's presentation of back pain, differential diagnosis includes but is not limited to fracture, muscle strain, cauda equina, spinal stenosis. DDD, ankylosing spondylitis, acute ligamentous injury, disk herniation, spondylolisthesis, Epidural compression syndrome, metastatic cancer, transverse myelitis, vertebral osteomyelitis, diskitis, kidney stone, pyelonephritis, AAA, Perforated ulcer, Retrocecal appendicitis, pancreatitis, bowel obstruction,  retroperitoneal hemorrhage or mass, meningitis.  This is most consistent with an acute complicated illness  Initial Plan:  Screening labs including CBC and Metabolic panel to evaluate for infectious or metabolic etiology of disease.  Urinalysis with reflex culture ordered to evaluate for UTI or relevant urologic/nephrologic pathology.  Lipase evaluate for pancreatitis CRP and ESR to evaluate for osteomyelitis CTA chest and abdomen to evaluate for aneurysm or dissection MRI thoracic and lumbar spine to evaluate for vertebral osteomyelitis, abscess, etc. Pain management Objective evaluation as reviewed   Initial Study Results:   Laboratory  All laboratory results reviewed without evidence of clinically relevant pathology.   Exceptions include: CRP 3.2, WBC 10.7, platelets 475, sed rate 19  EKG EKG was reviewed independently. ST segments without concerns for elevations.   EKG: normal sinus rhythm.   Radiology:  All images reviewed independently. Agree with radiology report at this time.   CT Angio Chest/Abd/Pel for Dissection W and/or W/WO  Result Date: 12/03/2022 CLINICAL DATA:  Mid back pain, concern for aneurysm or dissection EXAM: CT ANGIOGRAPHY CHEST, ABDOMEN AND PELVIS TECHNIQUE: Non-contrast CT of the chest was initially obtained. Multidetector CT imaging through the chest, abdomen and pelvis was performed using the standard protocol during bolus administration of intravenous contrast.  Multiplanar reconstructed images and MIPs were obtained and reviewed to evaluate the vascular anatomy. RADIATION DOSE REDUCTION: This exam was performed according to the departmental dose-optimization program which includes automated exposure control, adjustment of the mA and/or kV according to patient size and/or use of iterative reconstruction technique. CONTRAST:  100mL OMNIPAQUE IOHEXOL 350 MG/ML SOLN COMPARISON:  None Available. FINDINGS: CTA CHEST FINDINGS VASCULAR Aorta: Satisfactory opacification of the aorta. Normal contour and caliber of the thoracic aorta. No evidence of aneurysm, dissection, or other acute aortic pathology. Minimal scattered aortic atherosclerosis. Cardiovascular: No evidence of pulmonary embolism on limited non-tailored examination. Normal heart size. No pericardial effusion. Review of the MIP images confirms the above findings. NON VASCULAR Mediastinum/Nodes: No enlarged mediastinal, hilar, or axillary lymph nodes. Thyroid gland, trachea, and esophagus demonstrate no significant findings. Lungs/Pleura: Lungs are clear. No pleural effusion or pneumothorax. Musculoskeletal: Extensive disc degenerative disease and bridging osteophytosis throughout the thoracic and lumbar spine, in keeping with DISH. Paraspinous soft tissue thickening about T10 through T12 with extensive underlying bony sclerosis and erosion, particularly involving the T11-T12 disc space (series 7, image 116, series 11, image 112). Review of the MIP images confirms the above findings. CTA ABDOMEN AND PELVIS FINDINGS VASCULAR Mildly tortuous abdominal aorta. Otherwise normal contour and caliber of the abdominal aorta. No evidence of aneurysm, dissection, or other acute aortic pathology. Standard branching pattern of the abdominal aorta with solitary bilateral renal arteries. Review of the MIP images confirms the above findings. NON-VASCULAR Hepatobiliary: No solid liver abnormality is seen. No gallstones, gallbladder wall  thickening, or biliary dilatation. Pancreas: Unremarkable. No pancreatic ductal dilatation or surrounding inflammatory changes. Spleen: Normal in size without significant abnormality. Adrenals/Urinary Tract: Adrenal glands are unremarkable. Kidneys are normal, without renal calculi, solid lesion, or hydronephrosis. Bladder is unremarkable. Stomach/Bowel: Stomach is within normal limits. Appendix appears normal. No evidence of bowel wall thickening, distention, or inflammatory changes. Lymphatic: No enlarged abdominal or pelvic lymph nodes. Reproductive: No mass or other significant abnormality. Other: No abdominal wall hernia or abnormality. No ascites. Musculoskeletal: No acute osseous findings. Posterior discectomy, laminectomy, and fusion of L3 through S1. IMPRESSION: 1. Normal contour and caliber of the thoracic and abdominal aorta. No evidence of aneurysm, dissection, or other acute  aortic pathology. Minimal scattered aortic atherosclerosis. 2. Paraspinous soft tissue thickening about T10 through T12 with extensive underlying bony sclerosis and erosion, particularly involving the T11-T12 disc space. Findings are consistent with discitis/osteomyelitis and better assessed by same-day MR examination of the thoracic and lumbar spine. Aortic Atherosclerosis (ICD10-I70.0). Electronically Signed   By: Jearld Lesch M.D.   On: 12/03/2022 13:11   MR LUMBAR SPINE WO CONTRAST  Result Date: 12/03/2022 CLINICAL DATA:  Concern for osteomyelitis. EXAM: MRI LUMBAR SPINE WITHOUT CONTRAST TECHNIQUE: Multiplanar, multisequence MR imaging of the lumbar spine was performed. No intravenous contrast was administered. COMPARISON:  MRI T Spine 05/01/22, MRI L Spine 04/22/22 FINDINGS: Limitations: Assessment is somewhat limited due to incomplete exam. Axial T1 weighted sequences were not acquired. Segmentation: There is lumbarization of S1 with a small disc space at S1-S2. Alignment:  Grade 1 anterolisthesis of L5 on S1. Vertebrae:  There are postsurgical changes from L3-S1 posterior spinal fusion. Conus medullaris and cauda equina: Conus extends to the L1 level. Conus and cauda equina appear normal. Paraspinal and other soft tissues: There is abnormal T2/stir hyperintense signal surrounding the T11 and T12 vertebral bodies (series 5, image 3, 19). There is a 5.9 x 2.0 x 2.0 cm fluid collection spanning the L4-L5 interspinous space, at the site of prior laminectomy. Abnormal STIR hyperintense signal is also present in the L2-L3 disc space (series 5, image 9), which could suggest early discitis at this level, as well as at L1-L2. Disc levels: Redemonstrated are findings of discitis osteomyelitis in the lower thoracic spine at the T10-T12 vertebral body levels. There is abnormal T2/stir hyperintense signal at these levels, compatible with discitis. There is irregular endplate erosion, compatible with osteomyelitis. There is severe spinal canal stenosis at these levels secondary to the presence of abnormal fluid within the ventral and dorsal epidural space (series 8, image 6), most likely epidural phlegmon. There is an abnormal T2 hyperintense collection in the ventral epidural space at the L1 vertebral body level (series 8, image 15), which is nonspecific, but infection is not excluded. There is severe bilateral neural foraminal stenosis at L2-L3, which is a junctional level. There is severe left and moderate right neural foraminal stenosis at L3-L4 and L5-S1. Evidence of high-grade spinal canal stenosis. IMPRESSION: Incomplete exam. Axial T1 weighted sequences were not acquired. 1. Findings of discitis osteomyelitis in the lower thoracic spine at the T10-T12 vertebral body levels, with severe spinal canal stenosis at these levels secondary to the presence of abnormal fluid within the ventral and dorsal epidural space, most likely epidural phlegmon. There is an abnormal T2 hyperintense collection in the ventral epidural space at the L1 vertebral  body level, which is nonspecific, but inferior extension of infection is not excluded. These findinsg are unchanged from 05/01/22. 2. Abnormal STIR hyperintense signal in the L1-L2 and L2-L3 disc spaces could suggest early discitis. 3. Severe bilateral neural foraminal stenosis at L2-L3, which is a junctional level. Severe left and moderate right neural foraminal stenosis at L3-L4 and L5-S1. 4. 5.9 x 2.0 x 2.0 cm fluid collection spanning the L4-L5 interspinous space, at the site of prior laminectomy, which could reflect a postoperative seroma, although abscess is not excluded. This is unchanged compared to 04/22/22 Electronically Signed   By: Lorenza Cambridge M.D.   On: 12/03/2022 12:41      Consults: Case discussed with Patrici Ranks, PA-C with neurosurgery who recommended admission to medicine for IV antibiotics, no intervention at this time.  Attempted to consult infectious disease  but did not receive a page back.  Consulted hospitalist for admission who will start antibiotics accept admission of patient.  Final Assessment and Plan:   This is a 57 year old male who presents the ED with severe lower back pain.  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  He is on chronic Cipro and doxycycline therapy.  History of IV antibiotics by PICC line.  Today he mostly presents with severe lower back pain but does also note that he has some associated epigastric pain and feels like pain radiates through to his back.  He states that this pain is not as her as the back pain but is still significant.  He has not had this symptom before.  No associated chest pain, shortness of breath, neurologic deficits.  On exam, patient is neurologically intact.  He does have diffuse midline lumbar spinal tenderness.  No palpable step-offs or deformities.  He does express significant pain with any attempts at range of motion of the back.  He  is hypertensive but afebrile and otherwise vital signs are within normal limits.  Workup obtained as above for further assessment with patient's complex history.  At this time, he does not meet sepsis criteria.  Minimal leukocytosis at 10.7.  Elevated CRP and sed rate as above.  With patient's associated epigastric pain, did order a CTA chest abdomen for evaluation of possible dissection and aortic aneurysm.  This study was negative.  MRI scan does show discitis osteomyelitis to the thoracic spine.  Was able to obtain MRI lumbar spine but patient became claustrophobic and refused antianxiety medications and thus we were unable to complete MRI imaging of the thoracic spine.  With findings and patient having failed antibiotic therapy, consulted neurosurgery for recommendations.  They did not believe any intervention is required at this time but patient needs to be admitted to medicine for IV antibiotics.  Attempted to consult infectious disease for further recommendations but did not receive a call back.  With this, consulted hospitalist who accepts admission of patient.  Discussed all findings including previous cultures positive for Pseudomonas and MRSA.  They will start patient on an biotics and get him admitted to the hospital.  Patient updated on plan for admission and agreeable.  Patient stable at time of admission.   Clinical Impression:  1. Osteomyelitis of thoracic spine   2. Discitis of thoracic region   3. Acute midline low back pain without sciatica      Admit           Final Clinical Impression(s) / ED Diagnoses Final diagnoses:  Osteomyelitis of thoracic spine  Discitis of thoracic region  Acute midline low back pain without sciatica    Rx / DC Orders ED Discharge Orders     None         Tonette Lederer, PA-C 12/03/22 1459    Alvira Monday, MD 12/03/22 2328

## 2022-12-03 NOTE — Progress Notes (Signed)
Per ED provider, pt presented to ED w/ increased LBP x1 week. PMH ongoing discitis/osteomyelitis followed by ID. Pt is grossly neurologically intact upon exam.   CT/MRI reviewed showing ongoing osteo/discitis at the T10-T12 levels, unchanged from 04/2022. Abnormal STIR hyperintense signal in the L1-L2 and L2-L3 disc spaces could suggest early discitis. Unchanged fluid collection at L4-5 compared to MRI from 03/2022.   No neurosurgical intervention indicated at this time. Agree with ED plan to admit for pain control and further management of ongoing osteo/discitis. Consider IR biopsy of L1-2, L2-3 disc levels. Serial neurologic exams. Consider repeat MRI in 1-2 weeks.   Call w/ questions/concerns. Patrici Ranks, [facility]

## 2022-12-03 NOTE — ED Notes (Signed)
ED TO INPATIENT HANDOFF REPORT  ED Nurse Name and Phone #: Delice Bison, RN  S Name/Age/Gender John Ware 57 y.o. male Room/Bed: 021C/021C  Code Status   Code Status: Full Code  Home/SNF/Other Home Patient oriented to: self, place, time, and situation Is this baseline? Yes   Triage Complete: Triage complete  Chief Complaint Osteomyelitis [M86.9]  Triage Note Pt states he is having back pain that started on Friday. States it is "in the middle". Pt has been taking his Percocet for the pain with no relief.   Allergies Allergies  Allergen Reactions   Tizanidine Other (See Comments)    Leg cramps    Level of Care/Admitting Diagnosis ED Disposition     ED Disposition  Admit   Condition  --   Comment  Hospital Area: [facility] [[id number]]  Level of Care: Med-Surg [16]  May admit patient to [facility] or [facility] if equivalent level of care is available:: No  Covid Evaluation: Asymptomatic - no recent exposure (last 10 days) testing not required  Diagnosis: Osteomyelitis [542706]  Admitting Physician: Emeline General [[id number]]  Attending Physician: Emeline General [[id number]]  Certification:: I certify this patient will need inpatient services for at least 2 midnights  Estimated Length of Stay: 2          B Medical/Surgery History Past Medical History:  Diagnosis Date   Depression    Diabetes mellitus without complication    Hypertension    Spinal stenosis    With Neurogenic Claudication   Past Surgical History:  Procedure Laterality Date   CARPAL TUNNEL RELEASE Bilateral    INCISION AND DRAINAGE ABSCESS Left 03/12/2021   Procedure: INCISION AND DRAINAGE ABSCESS;  Surgeon: [name], MD;  Location: [facility];  Service: Orthopedics;  Laterality: Left;   IR FLUORO GUIDED NEEDLE PLC ASPIRATION/INJECTION LOC  04/24/2022   IR US GUIDE BX ASP/DRAIN  02/02/2021   IRRIGATION AND DEBRIDEMENT ABSCESS Left 02/08/2021   Procedure: MINOR INCISION AND  DRAINAGE OF ABSCESS;  Surgeon: [name], MD;  Location: [facility];  Service: Orthopedics;  Laterality: Left;   IRRIGATION AND DEBRIDEMENT ABSCESS Left 02/10/2021   Procedure: IRRIGATION AND DEBRIDEMENT ABSCESS LEFT LATERAL CHEST WALL;  Surgeon: [name], MD;  Location: [facility];  Service: General;  Laterality: Left;   IRRIGATION AND DEBRIDEMENT ABSCESS Bilateral 02/20/2021   Procedure: MINOR INCISION AND DRAINAGE OF ABSCESS/WITH BILATERAL WOUND VAC PLACEMENT;  Surgeon: [name], MD;  Location: [facility];  Service: Orthopedics;  Laterality: Bilateral;   IRRIGATION AND DEBRIDEMENT SHOULDER Bilateral 02/28/2021   Procedure: IRRIGATION AND DEBRIDEMENT SHOULDER;  Surgeon: [name], MD;  Location: [facility];  Service: Orthopedics;  Laterality: Bilateral;   IRRIGATION AND DEBRIDEMENT SHOULDER Left 03/06/2021   Procedure: IRRIGATION AND DEBRIDEMENT SHOULDER;  Surgeon: [name], MD;  Location: [facility];  Service: Orthopedics;  Laterality: Left;   IRRIGATION AND DEBRIDEMENT SHOULDER Left 03/08/2021   Procedure: IRRIGATION AND DEBRIDEMENT SHOULDER;  Surgeon: [name], MD;  Location: [facility];  Service: Orthopedics;  Laterality: Left;   LUMBAR LAMINECTOMY FOR EPIDURAL ABSCESS N/A 05/02/2022   Procedure: THORACIC  LAMINECTOMY AND DEBRIDEMENT  FOR EPIDURAL ABSCESS;  Surgeon: [name], MD;  Location: [facility];  Service: Neurosurgery;  Laterality: N/A;   LUMBAR LAMINECTOMY/DECOMPRESSION MICRODISCECTOMY N/A 12/13/2014   Procedure: LUMBAR THREE-FOUR, LUMBAR FOUR-FIVE, LUMBAR FIVE-SACRAL ONE LAMINECTOMY;  Surgeon: [name], MD;  Location: [facility];  Service: Neurosurgery;  Laterality: N/A;  L3-4 L4-5 L5-S1 Laminectomy  LUMBAR LAMINECTOMY/DECOMPRESSION MICRODISCECTOMY Left 06/05/2021   Procedure: Left lumbar one-two, Lumbar two-three Laminectomy;  Surgeon: [name], MD;  Location: [facility] OR;  Service: Neurosurgery;  Laterality: Left;   MENISCUS REPAIR     Right knee   MINOR APPLICATION OF  WOUND VAC Left 02/20/2021   Procedure: MINOR APPLICATION OF KERLIX PACKING/REMOVAL OF WOUND VAC;  Surgeon: [name], MD;  Location: [facility];  Service: General;  Laterality: Left;   TEE WITHOUT CARDIOVERSION N/A 02/05/2021   Procedure: TRANSESOPHAGEAL ECHOCARDIOGRAM (TEE);  Surgeon: Quintella Reichert, MD;  Location: [facility];  Service: Cardiovascular;  Laterality: N/A;   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   TOTAL SHOULDER ARTHROPLASTY Right 02/10/2021   Procedure: RIGHT OPEN SHOULDER I & D, OPEN DISTAL CLAVICLE RESECTION;  Surgeon: [name], MD;  Location: [facility];  Service: Orthopedics;  Laterality: Right;   VASECTOMY       A IV Location/Drains/Wounds Patient Lines/Drains/Airways Status     Active Line/Drains/Airways     Name Placement date Placement time Site Days   Peripheral IV 12/03/22 20 G 1" Anterior;Right Forearm 12/03/22  1051  Forearm  less than 1   Peripheral IV 12/03/22 20 G 1" Anterior;Left;Proximal Forearm 12/03/22  1051  Forearm  less than 1   Incision (Closed) 05/02/22 Back 05/02/22  1743  -- 215            Intake/Output Last 24 hours No intake or output data in the 24 hours ending 12/03/22 1510  Labs/Imaging Results for orders placed or performed during the hospital encounter of 12/03/22 (from the past 48 hour(s))  CBC with Differential     Status: Abnormal   Collection Time: 12/03/22 10:47 AM  Result Value Ref Range   WBC 10.7 (H) 4.0 - 10.5 K/uL   RBC 4.75 4.22 - 5.81 MIL/uL   Hemoglobin 13.2 13.0 - 17.0 g/dL   HCT 16.1 09.6 - 04.5 %   MCV 85.1 80.0 - 100.0 fL   MCH 27.8 26.0 - 34.0 pg   MCHC 32.7 30.0 - 36.0 g/dL   RDW 40.9 81.1 - 91.4 %   Platelets 475 (H) 150 - 400 K/uL   nRBC 0.0 0.0 - 0.2 %   Neutrophils Relative % 79 %   Neutro Abs 8.4 (H) 1.7 - 7.7 K/uL   Lymphocytes Relative 14 %   Lymphs Abs 1.5 0.7 - 4.0 K/uL   Monocytes Relative 7 %   Monocytes Absolute 0.7 0.1 - 1.0 K/uL   Eosinophils Relative 0 %    Eosinophils Absolute 0.0 0.0 - 0.5 K/uL   Basophils Relative 0 %   Basophils Absolute 0.0 0.0 - 0.1 K/uL   Immature Granulocytes 0 %   Abs Immature Granulocytes 0.03 0.00 - 0.07 K/uL    Comment: Performed at [facility], [address] [place], [place] [address]  Comprehensive metabolic panel     Status: Abnormal   Collection Time: 12/03/22 10:47 AM  Result Value Ref Range   Sodium 140 135 - 145 mmol/L   Potassium 3.9 3.5 - 5.1 mmol/L   Chloride 100 98 - 111 mmol/L   CO2 27 22 - 32 mmol/L   Glucose, Bld 213 (H) 70 - 99 mg/dL    Comment: Glucose reference range applies only to samples taken after fasting for at least 8 hours.   BUN 7 6 - 20 mg/dL   Creatinine, Ser 6.21 0.61 - 1.24 mg/dL   Calcium 9.2 8.9 -  10.3 mg/dL   Total Protein 7.6 6.5 - 8.1 g/dL   Albumin 3.7 3.5 - 5.0 g/dL   AST 24 15 - 41 U/L   ALT 18 0 - 44 U/L   Alkaline Phosphatase 123 38 - 126 U/L   Total Bilirubin 0.5 0.3 - 1.2 mg/dL   GFR, Estimated >00 >76 mL/min    Comment: (NOTE) Calculated using the CKD-EPI Creatinine Equation (2021)    Anion gap 13 5 - 15    Comment: Performed at [facility], [address], [place], [place] [address]  Lipase, blood     Status: Abnormal   Collection Time: 12/03/22 10:47 AM  Result Value Ref Range   Lipase 99 (H) 11 - 51 U/L    Comment: Performed at [facility], [address] [place], [place] [address]  Sedimentation rate     Status: Abnormal   Collection Time: 12/03/22 10:47 AM  Result Value Ref Range   Sed Rate 19 (H) 0 - 16 mm/hr    Comment: Performed at [facility], [address] [place], [place] [address]  C-reactive protein     Status: Abnormal   Collection Time: 12/03/22 10:47 AM  Result Value Ref Range   CRP 3.2 (H) <1.0 mg/dL    Comment: Performed at [facility], [address] [place], [place] [address]   CT Angio Chest/Abd/Pel for Dissection W and/or W/WO  Result Date: 12/03/2022 CLINICAL DATA:  Mid back pain, concern  for aneurysm or dissection EXAM: CT ANGIOGRAPHY CHEST, ABDOMEN AND PELVIS TECHNIQUE: Non-contrast CT of the chest was initially obtained. Multidetector CT imaging through the chest, abdomen and pelvis was performed using the standard protocol during bolus administration of intravenous contrast. Multiplanar reconstructed images and MIPs were obtained and reviewed to evaluate the vascular anatomy. RADIATION DOSE REDUCTION: This exam was performed according to the departmental dose-optimization program which includes automated exposure control, adjustment of the mA and/or kV according to patient size and/or use of iterative reconstruction technique. CONTRAST:  OMNIPAQUE IOHEXOL 350 MG/ML SOLN COMPARISON:  None Available. FINDINGS: CTA CHEST FINDINGS VASCULAR Aorta: Satisfactory opacification of the aorta. Normal contour and caliber of the thoracic aorta. No evidence of aneurysm, dissection, or other acute aortic pathology. Minimal scattered aortic atherosclerosis. Cardiovascular: No evidence of pulmonary embolism on limited non-tailored examination. Normal heart size. No pericardial effusion. Review of the MIP images confirms the above findings. NON VASCULAR Mediastinum/Nodes: No enlarged mediastinal, hilar, or axillary lymph nodes. Thyroid gland, trachea, and esophagus demonstrate no significant findings. Lungs/Pleura: Lungs are clear. No pleural effusion or pneumothorax. Musculoskeletal: Extensive disc degenerative disease and bridging osteophytosis throughout the thoracic and lumbar spine, in keeping with DISH. Paraspinous soft tissue thickening about T10 through T12 with extensive underlying bony sclerosis and erosion, particularly involving the T11-T12 disc space (series 7, image 116, series 11, image 112). Review of the MIP images confirms the above findings. CTA ABDOMEN AND PELVIS FINDINGS VASCULAR Mildly tortuous abdominal aorta. Otherwise normal contour and caliber of the abdominal aorta. No evidence of  aneurysm, dissection, or other acute aortic pathology. Standard branching pattern of the abdominal aorta with solitary bilateral renal arteries. Review of the MIP images confirms the above findings. NON-VASCULAR Hepatobiliary: No solid liver abnormality is seen. No gallstones, gallbladder wall thickening, or biliary dilatation. Pancreas: Unremarkable. No pancreatic ductal dilatation or surrounding inflammatory changes. Spleen: Normal in size without significant abnormality. Adrenals/Urinary Tract: Adrenal glands are unremarkable. Kidneys are normal, without renal calculi,  solid lesion, or hydronephrosis. Bladder is unremarkable. Stomach/Bowel: Stomach is within normal limits. Appendix appears normal. No evidence of bowel wall thickening, distention, or inflammatory changes. Lymphatic: No enlarged abdominal or pelvic lymph nodes. Reproductive: No mass or other significant abnormality. Other: No abdominal wall hernia or abnormality. No ascites. Musculoskeletal: No acute osseous findings. Posterior discectomy, laminectomy, and fusion of L3 through S1. IMPRESSION: 1. Normal contour and caliber of the thoracic and abdominal aorta. No evidence of aneurysm, dissection, or other acute aortic pathology. Minimal scattered aortic atherosclerosis. 2. Paraspinous soft tissue thickening about T10 through T12 with extensive underlying bony sclerosis and erosion, particularly involving the T11-T12 disc space. Findings are consistent with discitis/osteomyelitis and better assessed by same-day MR examination of the thoracic and lumbar spine. Aortic Atherosclerosis (ICD10-I70.0). Electronically Signed   By: Jearld LeschAlex D Bibbey M.D.   On: 12/03/2022 13:11   MR LUMBAR SPINE WO CONTRAST  Result Date: 12/03/2022 CLINICAL DATA:  Concern for osteomyelitis. EXAM: MRI LUMBAR SPINE WITHOUT CONTRAST TECHNIQUE: Multiplanar, multisequence MR imaging of the lumbar spine was performed. No intravenous contrast was administered. COMPARISON:  MRI T Spine  05/01/22, MRI L Spine 04/22/22 FINDINGS: Limitations: Assessment is somewhat limited due to incomplete exam. Axial T1 weighted sequences were not acquired. Segmentation: There is lumbarization of S1 with a small disc space at S1-S2. Alignment:  Grade 1 anterolisthesis of L5 on S1. Vertebrae: There are postsurgical changes from L3-S1 posterior spinal fusion. Conus medullaris and cauda equina: Conus extends to the L1 level. Conus and cauda equina appear normal. Paraspinal and other soft tissues: There is abnormal T2/stir hyperintense signal surrounding the T11 and T12 vertebral bodies (series 5, image 3, 19). There is a 5.9 x 2.0 x 2.0 cm fluid collection spanning the L4-L5 interspinous space, at the site of prior laminectomy. Abnormal STIR hyperintense signal is also present in the L2-L3 disc space (series 5, image 9), which could suggest early discitis at this level, as well as at L1-L2. Disc levels: Redemonstrated are findings of discitis osteomyelitis in the lower thoracic spine at the T10-T12 vertebral body levels. There is abnormal T2/stir hyperintense signal at these levels, compatible with discitis. There is irregular endplate erosion, compatible with osteomyelitis. There is severe spinal canal stenosis at these levels secondary to the presence of abnormal fluid within the ventral and dorsal epidural space (series 8, image 6), most likely epidural phlegmon. There is an abnormal T2 hyperintense collection in the ventral epidural space at the L1 vertebral body level (series 8, image 15), which is nonspecific, but infection is not excluded. There is severe bilateral neural foraminal stenosis at L2-L3, which is a junctional level. There is severe left and moderate right neural foraminal stenosis at L3-L4 and L5-S1. Evidence of high-grade spinal canal stenosis. IMPRESSION: Incomplete exam. Axial T1 weighted sequences were not acquired. 1. Findings of discitis osteomyelitis in the lower thoracic spine at the T10-T12  vertebral body levels, with severe spinal canal stenosis at these levels secondary to the presence of abnormal fluid within the ventral and dorsal epidural space, most likely epidural phlegmon. There is an abnormal T2 hyperintense collection in the ventral epidural space at the L1 vertebral body level, which is nonspecific, but inferior extension of infection is not excluded. These findinsg are unchanged from 05/01/22. 2. Abnormal STIR hyperintense signal in the L1-L2 and L2-L3 disc spaces could suggest early discitis. 3. Severe bilateral neural foraminal stenosis at L2-L3, which is a junctional level. Severe left and moderate right neural foraminal stenosis at L3-L4 and L5-S1.  4. 5.9 x 2.0 x 2.0 cm fluid collection spanning the L4-L5 interspinous space, at the site of prior laminectomy, which could reflect a postoperative seroma, although abscess is not excluded. This is unchanged compared to 04/22/22 Electronically Signed   By: Lorenza Cambridge M.D.   On: 12/03/2022 12:41    Pending Labs Unresulted Labs (From admission, onward)     Start     Ordered   12/04/22 0500  CBC  Tomorrow morning,   R        12/03/22 1424   12/03/22 1424  HIV Antibody (routine testing w rflx)  (HIV Antibody (Routine testing w reflex) panel)  Once,   R        12/03/22 1424   12/03/22 1031  Blood culture (routine x 2)  BLOOD CULTURE X 2,   R (with STAT occurrences)      12/03/22 1030   12/03/22 1021  Urinalysis, Routine w reflex microscopic -Urine, Clean Catch  (ED Abdominal Pain)  Once,   URGENT       Question:  Specimen Source  Answer:  Urine, Clean Catch   12/03/22 1030            Vitals/Pain Today's Vitals   12/03/22 1124 12/03/22 1309 12/03/22 1355 12/03/22 1414  BP:   (!) 144/98   Pulse: 92  96   Resp: 20  18   Temp:   98.2 F (36.8 C)   TempSrc:   Oral   SpO2: 97%  100%   PainSc:  7   2     Isolation Precautions No active isolations  Medications Medications  acetaminophen (TYLENOL) tablet 500 mg (has  no administration in time range)  morphine (MS CONTIN) 12 hr tablet 15 mg (has no administration in time range)  DULoxetine (CYMBALTA) DR capsule 60 mg (has no administration in time range)  baclofen (LIORESAL) tablet 5 mg (has no administration in time range)  gabapentin (NEURONTIN) capsule 400 mg (400 mg Oral Given 12/03/22 1439)  enoxaparin (LOVENOX) injection 40 mg (40 mg Subcutaneous Given 12/03/22 1439)  HYDROmorphone (DILAUDID) injection 0.5-1 mg (has no administration in time range)  bisacodyl (DULCOLAX) EC tablet 5 mg (has no administration in time range)  senna-docusate (Senokot-S) tablet 1 tablet (has no administration in time range)  oxyCODONE-acetaminophen (PERCOCET/ROXICET) 5-325 MG per tablet 1 tablet (has no administration in time range)    And  oxyCODONE (Oxy IR/ROXICODONE) immediate release tablet 5 mg (has no administration in time range)  morphine (PF) 4 MG/ML injection 4 mg (4 mg Intravenous Given 12/03/22 1054)  ondansetron (ZOFRAN) injection 4 mg (4 mg Intravenous Given 12/03/22 1054)  iohexol (OMNIPAQUE) 350 MG/ML injection 100 mL (100 mLs Intravenous Contrast Given 12/03/22 1257)  HYDROmorphone (DILAUDID) injection 2 mg (2 mg Intravenous Given 12/03/22 1323)    Mobility walks     Focused Assessments Cardiac Assessment Handoff:    Lab Results  Component Value Date   CKTOTAL 26 (L) 06/21/2021   Lab Results  Component Value Date   DDIMER 0.53 (H) 10/20/2020   Does the Patient currently have chest pain? No    R Recommendations: See Admitting Provider Note  Report given to:   Additional Notes:

## 2022-12-03 NOTE — H&P (Signed)
History and Physical    John Ware ZOX:[id number] DOB: Feb 12, 1966 DOA: 12/03/2022  PCP: Junie Spencer, FNP (Confirm with patient/family/NH records and if not entered, this has to be entered at [facility] point of entry) Patient coming from: Home  I have personally briefly reviewed patient's old medical records in [facility]  Chief Complaint: Severe back pain  HPI: John Ware is a 57 y.o. male with medical history significant of tricuspid valve endocarditis, thoracic and lumbar spine osteomyelitis discitis status post IV antibiotics on chronic suppressive antibiotic treatment, MSSA and Pseudomonas infection, chronic narcotic dependence, chronic pain presented with worsening of back pain.  Symptoms started about 1 week ago with increasing dull ache back pain in the middle to mid lower back, occasionally radiating to both flanks with some numbness, denies any weakness numbness of the abdominal limbs, no urinary problems no bowel movement issue.  Denies any fever or chills.  Patient was last seen by infectious disease in the office about 2 weeks ago, he has been taking chronic Cipro and doxycycline for suppressive antibiotic treatment since October 2023 without issue. ED Course: Afebrile, none tachycardia none hypotension.  Thoracic spine and lumbar spine MRI showed discitis osteomyelitis in the lower thoracic spine T10-12, early discitis L1-L3 5.9 x 2.0 x 2.0 cm fluid collection spanning L4-L5.  Neurosurgery and infectious disease consulted in the ED.  Review of Systems: As per HPI otherwise 14 point review of systems negative.    Past Medical History:  Diagnosis Date   Depression    Diabetes mellitus without complication    Hypertension    Spinal stenosis    With Neurogenic Claudication    Past Surgical History:  Procedure Laterality Date   CARPAL TUNNEL RELEASE Bilateral    INCISION AND DRAINAGE ABSCESS Left 03/12/2021   Procedure: INCISION AND DRAINAGE ABSCESS;  Surgeon:  [name], MD;  Location: [facility];  Service: Orthopedics;  Laterality: Left;   IR FLUORO GUIDED NEEDLE PLC ASPIRATION/INJECTION LOC  04/24/2022   IR US GUIDE BX ASP/DRAIN  02/02/2021   IRRIGATION AND DEBRIDEMENT ABSCESS Left 02/08/2021   Procedure: MINOR INCISION AND DRAINAGE OF ABSCESS;  Surgeon: [name], MD;  Location: [facility];  Service: Orthopedics;  Laterality: Left;   IRRIGATION AND DEBRIDEMENT ABSCESS Left 02/10/2021   Procedure: IRRIGATION AND DEBRIDEMENT ABSCESS LEFT LATERAL CHEST WALL;  Surgeon: [name], MD;  Location: [facility];  Service: General;  Laterality: Left;   IRRIGATION AND DEBRIDEMENT ABSCESS Bilateral 02/20/2021   Procedure: MINOR INCISION AND DRAINAGE OF ABSCESS/WITH BILATERAL WOUND VAC PLACEMENT;  Surgeon: [name], MD;  Location: [facility];  Service: Orthopedics;  Laterality: Bilateral;   IRRIGATION AND DEBRIDEMENT SHOULDER Bilateral 02/28/2021   Procedure: IRRIGATION AND DEBRIDEMENT SHOULDER;  Surgeon: [name], MD;  Location: [facility];  Service: Orthopedics;  Laterality: Bilateral;   IRRIGATION AND DEBRIDEMENT SHOULDER Left 03/06/2021   Procedure: IRRIGATION AND DEBRIDEMENT SHOULDER;  Surgeon: [name], MD;  Location: [facility];  Service: Orthopedics;  Laterality: Left;   IRRIGATION AND DEBRIDEMENT SHOULDER Left 03/08/2021   Procedure: IRRIGATION AND DEBRIDEMENT SHOULDER;  Surgeon: [name], MD;  Location: [facility];  Service: Orthopedics;  Laterality: Left;   LUMBAR LAMINECTOMY FOR EPIDURAL ABSCESS N/A 05/02/2022   Procedure: THORACIC  LAMINECTOMY AND DEBRIDEMENT  FOR EPIDURAL ABSCESS;  Surgeon: [name], MD;  Location: [facility];  Service: Neurosurgery;  Laterality: N/A;   LUMBAR LAMINECTOMY/DECOMPRESSION MICRODISCECTOMY N/A 12/13/2014   Procedure: LUMBAR THREE-FOUR, LUMBAR FOUR-FIVE, LUMBAR FIVE-SACRAL ONE LAMINECTOMY;  Surgeon: [name], MD;  Location: [facility] NEURO ORS;  Service: Neurosurgery;  Laterality: N/A;  L3-4 L4-5 L5-S1 Laminectomy   LUMBAR  LAMINECTOMY/DECOMPRESSION MICRODISCECTOMY Left 06/05/2021   Procedure: Left lumbar one-two, Lumbar two-three Laminectomy;  Surgeon: [name], MD;  Location: [facility];  Service: Neurosurgery;  Laterality: Left;   MENISCUS REPAIR     Right knee   MINOR APPLICATION OF WOUND VAC Left 02/20/2021   Procedure: MINOR APPLICATION OF KERLIX PACKING/REMOVAL OF WOUND VAC;  Surgeon: [name], MD;  Location: [facility];  Service: General;  Laterality: Left;   TEE WITHOUT CARDIOVERSION N/A 02/05/2021   Procedure: TRANSESOPHAGEAL ECHOCARDIOGRAM (TEE);  Surgeon: Quintella Reichert, MD;  Location: [facility] ENDOSCOPY;  Service: Cardiovascular;  Laterality: N/A;   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 His smoking use included cigarettes. He has a 30.00 pack-year smoking history. He has never used smokeless tobacco. He reports current alcohol use. He reports that he does not use drugs.  Allergies  Allergen Reactions   Tizanidine Other (See Comments)    Leg cramps    Family History  Problem Relation Age of Onset   Cancer Mother    Colon cancer Neg Hx      Prior to Admission medications   Medication Sig Start Date End Date Taking? Authorizing Provider  acetaminophen (TYLENOL) 500 MG tablet Take 1 tablet (500 mg total) by mouth every 6 (six) hours as needed. 06/26/21   Love, Evlyn Kanner, PA-C  Baclofen 5 MG TABS Take 1 tablet by mouth 3 (three) times daily. Patient not taking: Reported on 08/16/2022 05/16/22   Love, Evlyn Kanner, PA-C  Baclofen 5 MG TABS Take 1 tablet by mouth 3 (three) times daily. 08/21/22     Baclofen 5 MG TABS Take 1 tablet (5 mg total) by mouth 3 (three) times daily. 11/13/22     ciprofloxacin (CIPRO) 250 MG  tablet Take 1 tablet (250 mg total) by mouth 2 (two) times daily. 09/26/22   Odette Fraction, MD  ciprofloxacin (CIPRO) 500 MG tablet Take 1 tablet (500 mg total) by mouth 2 (two) times daily. 09/26/22   Odette Fraction, MD  Continuous Blood Gluc Sensor (FREESTYLE LIBRE 2 SENSOR) MISC CHECK BLOOD SUGAR 4 TIMES A DAY 08/22/22   Hawks, Neysa Bonito A, FNP  doxycycline (VIBRA-TABS) 100 MG tablet Take 1 tablet (100 mg total) by mouth 2 (two) times daily. 07/03/22   Odette Fraction, MD  DULoxetine (CYMBALTA) 60 MG capsule Take 1 capsule (60 mg total) by mouth at bedtime. Patient not taking: Reported on 08/16/2022 05/16/22   Love, Evlyn Kanner, PA-C  DULoxetine (CYMBALTA) 60 MG capsule Take 1 capsule (60 mg total) by mouth at bedtime. 11/13/22     gabapentin (NEURONTIN) 400 MG capsule Take 1 capsule (400 mg total) by mouth 2 (two) times daily. 08/16/22   Junie Spencer, FNP  hydrOXYzine (ATARAX) 10 MG tablet Take 1 tablet (10 mg total) by mouth 3 (three) times daily. Patient not taking: Reported on 08/16/2022 05/16/22   Love, Evlyn Kanner, PA-C  hydrOXYzine (ATARAX) 10 MG tablet Take 1 tablet (10 mg total) by mouth 3 (three) times daily. 08/21/22     hydrOXYzine (  ATARAX) 10 MG tablet Take 1 tablet (10 mg total) by mouth 3 (three) times daily. 11/13/22     methadone (DOLOPHINE) 5 MG tablet Take 3 tablets (15 mg total) by mouth every 12 (twelve) hours. Patient not taking: Reported on 08/16/2022 05/16/22   Love, Evlyn Kanner, PA-C  morphine (MS CONTIN) 15 MG 12 hr tablet Take 15 mg by mouth 3 (three) times daily. 06/25/22   [provider]  morphine (MS CONTIN) 30 MG 12 hr tablet Take 1 tablet by mouth every twelve hours 11/13/22     naloxone (NARCAN) 0.4 MG/ML injection Inject 1 mL (0.4 mg total) into the vein as needed. Patient not taking: Reported on 08/16/2022 06/26/21   Love, Evlyn Kanner, PA-C  oxyCODONE-acetaminophen (PERCOCET) 10-325 MG tablet Take 1 tablet by mouth every 6 (six) hours as needed (breakthrough  pain). 05/17/22   Love, Evlyn Kanner, PA-C  oxyCODONE-acetaminophen (PERCOCET) 10-325 MG tablet Take 1 tablet by mouth every 6 (six) hours. Stop taking methadone. 05/23/22     oxyCODONE-acetaminophen (PERCOCET) 10-325 MG tablet Take 1 tablet by mouth 5 (five) times daily. 07/24/22     oxyCODONE-acetaminophen (PERCOCET) 10-325 MG tablet Take 1 tablet by mouth 5 (five) times daily. 08/21/22     oxyCODONE-acetaminophen (PERCOCET) 10-325 MG tablet Take 1 tablet by mouth 5 (five) times daily. 09/18/22     oxyCODONE-acetaminophen (PERCOCET) 10-325 MG tablet Take 1 tablet by mouth 5 (five) times daily. 10/16/22     oxyCODONE-acetaminophen (PERCOCET) 10-325 MG tablet Take 1 tablet by mouth five times a day 11/13/22       Physical Exam: Vitals:   12/03/22 1015 12/03/22 1110 12/03/22 1124 12/03/22 1355  BP: (!) 151/118 (!) 146/93  (!) 144/98  Pulse: (!) 103 (!) 104 92 96  Resp:  18 20 18   Temp:    98.2 F (36.8 C)  TempSrc:    Oral  SpO2: 98% 98% 97% 100%    Constitutional: NAD, calm, comfortable Vitals:   12/03/22 1015 12/03/22 1110 12/03/22 1124 12/03/22 1355  BP: (!) 151/118 (!) 146/93  (!) 144/98  Pulse: (!) 103 (!) 104 92 96  Resp:  18 20 18   Temp:    98.2 F (36.8 C)  TempSrc:    Oral  SpO2: 98% 98% 97% 100%   Eyes: PERRL, lids and conjunctivae normal ENMT: Mucous membranes are moist. Posterior pharynx clear of any exudate or lesions.Normal dentition.  Neck: normal, supple, no masses, no thyromegaly Respiratory: clear to auscultation bilaterally, no wheezing, no crackles. Normal respiratory effort. No accessory muscle use.  Cardiovascular: Regular rate and rhythm, no murmurs / rubs / gallops. No extremity edema. 2+ pedal pulses. No carotid bruits.  Abdomen: no tenderness, no masses palpated. No hepatosplenomegaly. Bowel sounds positive.  Musculoskeletal: tenderness on the L1-L3 paraspinal area Skin: no rashes, lesions, ulcers. No induration Neurologic: CN 2-12 grossly intact. Sensation  intact, DTR normal. Strength 5/5 in all 4.  Psychiatric: Normal judgment and insight. Alert and oriented x 3. Normal mood.     Labs on Admission: I have personally reviewed following labs and imaging studies  CBC: Recent Labs  Lab 12/03/22 1047  WBC 10.7*  NEUTROABS 8.4*  HGB 13.2  HCT 40.4  MCV 85.1  PLT 475*   Basic Metabolic Panel: Recent Labs  Lab 12/03/22 1047  NA 140  K 3.9  CL 100  CO2 27  GLUCOSE 213*  BUN 7  CREATININE 0.72  CALCIUM 9.2   GFR: Estimated Creatinine Clearance: 107.7 mL/min (by  C-G formula based on SCr of 0.72 mg/dL). Liver Function Tests: Recent Labs  Lab 12/03/22 1047  AST 24  ALT 18  ALKPHOS 123  BILITOT 0.5  PROT 7.6  ALBUMIN 3.7   Recent Labs  Lab 12/03/22 1047  LIPASE 99*   No results for input(s): "AMMONIA" in the last 168 hours. Coagulation Profile: No results for input(s): "INR", "PROTIME" in the last 168 hours. Cardiac Enzymes: No results for input(s): "CKTOTAL", "CKMB", "CKMBINDEX", "TROPONINI" in the last 168 hours. BNP (last 3 results) No results for input(s): "PROBNP" in the last 8760 hours. HbA1C: No results for input(s): "HGBA1C" in the last 72 hours. CBG: No results for input(s): "GLUCAP" in the last 168 hours. Lipid Profile: No results for input(s): "CHOL", "HDL", "LDLCALC", "TRIG", "CHOLHDL", "LDLDIRECT" in the last 72 hours. Thyroid Function Tests: No results for input(s): "TSH", "T4TOTAL", "FREET4", "T3FREE", "THYROIDAB" in the last 72 hours. Anemia Panel: No results for input(s): "VITAMINB12", "FOLATE", "FERRITIN", "TIBC", "IRON", "RETICCTPCT" in the last 72 hours. Urine analysis:    Component Value Date/Time   COLORURINE YELLOW 04/22/2022 2315   APPEARANCEUR CLEAR 04/22/2022 2315   LABSPEC 1.009 04/22/2022 2315   PHURINE 5.0 04/22/2022 2315   GLUCOSEU NEGATIVE 04/22/2022 2315   HGBUR SMALL (A) 04/22/2022 2315   BILIRUBINUR NEGATIVE 04/22/2022 2315   KETONESUR NEGATIVE 04/22/2022 2315   PROTEINUR  NEGATIVE 04/22/2022 2315   NITRITE NEGATIVE 04/22/2022 2315   LEUKOCYTESUR NEGATIVE 04/22/2022 2315    Radiological Exams on Admission: CT Angio Chest/Abd/Pel for Dissection W and/or W/WO  Result Date: 12/03/2022 CLINICAL DATA:  Mid back pain, concern for aneurysm or dissection EXAM: CT ANGIOGRAPHY CHEST, ABDOMEN AND PELVIS TECHNIQUE: Non-contrast CT of the chest was initially obtained. Multidetector CT imaging through the chest, abdomen and pelvis was performed using the standard protocol during bolus administration of intravenous contrast. Multiplanar reconstructed images and MIPs were obtained and reviewed to evaluate the vascular anatomy. RADIATION DOSE REDUCTION: This exam was performed according to the departmental dose-optimization program which includes automated exposure control, adjustment of the mA and/or kV according to patient size and/or use of iterative reconstruction technique. CONTRAST:  OMNIPAQUE IOHEXOL 350 MG/ML SOLN COMPARISON:  None Available. FINDINGS: CTA CHEST FINDINGS VASCULAR Aorta: Satisfactory opacification of the aorta. Normal contour and caliber of the thoracic aorta. No evidence of aneurysm, dissection, or other acute aortic pathology. Minimal scattered aortic atherosclerosis. Cardiovascular: No evidence of pulmonary embolism on limited non-tailored examination. Normal heart size. No pericardial effusion. Review of the MIP images confirms the above findings. NON VASCULAR Mediastinum/Nodes: No enlarged mediastinal, hilar, or axillary lymph nodes. Thyroid gland, trachea, and esophagus demonstrate no significant findings. Lungs/Pleura: Lungs are clear. No pleural effusion or pneumothorax. Musculoskeletal: Extensive disc degenerative disease and bridging osteophytosis throughout the thoracic and lumbar spine, in keeping with DISH. Paraspinous soft tissue thickening about T10 through T12 with extensive underlying bony sclerosis and erosion, particularly involving the T11-T12  disc space (series 7, image 116, series 11, image 112). Review of the MIP images confirms the above findings. CTA ABDOMEN AND PELVIS FINDINGS VASCULAR Mildly tortuous abdominal aorta. Otherwise normal contour and caliber of the abdominal aorta. No evidence of aneurysm, dissection, or other acute aortic pathology. Standard branching pattern of the abdominal aorta with solitary bilateral renal arteries. Review of the MIP images confirms the above findings. NON-VASCULAR Hepatobiliary: No solid liver abnormality is seen. No gallstones, gallbladder wall thickening, or biliary dilatation. Pancreas: Unremarkable. No pancreatic ductal dilatation or surrounding inflammatory changes. Spleen: Normal in size  without significant abnormality. Adrenals/Urinary Tract: Adrenal glands are unremarkable. Kidneys are normal, without renal calculi, solid lesion, or hydronephrosis. Bladder is unremarkable. Stomach/Bowel: Stomach is within normal limits. Appendix appears normal. No evidence of bowel wall thickening, distention, or inflammatory changes. Lymphatic: No enlarged abdominal or pelvic lymph nodes. Reproductive: No mass or other significant abnormality. Other: No abdominal wall hernia or abnormality. No ascites. Musculoskeletal: No acute osseous findings. Posterior discectomy, laminectomy, and fusion of L3 through S1. IMPRESSION: 1. Normal contour and caliber of the thoracic and abdominal aorta. No evidence of aneurysm, dissection, or other acute aortic pathology. Minimal scattered aortic atherosclerosis. 2. Paraspinous soft tissue thickening about T10 through T12 with extensive underlying bony sclerosis and erosion, particularly involving the T11-T12 disc space. Findings are consistent with discitis/osteomyelitis and better assessed by same-day MR examination of the thoracic and lumbar spine. Aortic Atherosclerosis (ICD10-I70.0). Electronically Signed   By: Jearld LeschAlex D Bibbey M.D.   On: 12/03/2022 13:11   MR LUMBAR SPINE WO  CONTRAST  Result Date: 12/03/2022 CLINICAL DATA:  Concern for osteomyelitis. EXAM: MRI LUMBAR SPINE WITHOUT CONTRAST TECHNIQUE: Multiplanar, multisequence MR imaging of the lumbar spine was performed. No intravenous contrast was administered. COMPARISON:  MRI T Spine 05/01/22, MRI L Spine 04/22/22 FINDINGS: Limitations: Assessment is somewhat limited due to incomplete exam. Axial T1 weighted sequences were not acquired. Segmentation: There is lumbarization of S1 with a small disc space at S1-S2. Alignment:  Grade 1 anterolisthesis of L5 on S1. Vertebrae: There are postsurgical changes from L3-S1 posterior spinal fusion. Conus medullaris and cauda equina: Conus extends to the L1 level. Conus and cauda equina appear normal. Paraspinal and other soft tissues: There is abnormal T2/stir hyperintense signal surrounding the T11 and T12 vertebral bodies (series 5, image 3, 19). There is a 5.9 x 2.0 x 2.0 cm fluid collection spanning the L4-L5 interspinous space, at the site of prior laminectomy. Abnormal STIR hyperintense signal is also present in the L2-L3 disc space (series 5, image 9), which could suggest early discitis at this level, as well as at L1-L2. Disc levels: Redemonstrated are findings of discitis osteomyelitis in the lower thoracic spine at the T10-T12 vertebral body levels. There is abnormal T2/stir hyperintense signal at these levels, compatible with discitis. There is irregular endplate erosion, compatible with osteomyelitis. There is severe spinal canal stenosis at these levels secondary to the presence of abnormal fluid within the ventral and dorsal epidural space (series 8, image 6), most likely epidural phlegmon. There is an abnormal T2 hyperintense collection in the ventral epidural space at the L1 vertebral body level (series 8, image 15), which is nonspecific, but infection is not excluded. There is severe bilateral neural foraminal stenosis at L2-L3, which is a junctional level. There is severe left  and moderate right neural foraminal stenosis at L3-L4 and L5-S1. Evidence of high-grade spinal canal stenosis. IMPRESSION: Incomplete exam. Axial T1 weighted sequences were not acquired. 1. Findings of discitis osteomyelitis in the lower thoracic spine at the T10-T12 vertebral body levels, with severe spinal canal stenosis at these levels secondary to the presence of abnormal fluid within the ventral and dorsal epidural space, most likely epidural phlegmon. There is an abnormal T2 hyperintense collection in the ventral epidural space at the L1 vertebral body level, which is nonspecific, but inferior extension of infection is not excluded. These findinsg are unchanged from 05/01/22. 2. Abnormal STIR hyperintense signal in the L1-L2 and L2-L3 disc spaces could suggest early discitis. 3. Severe bilateral neural foraminal stenosis at L2-L3, which is  a junctional level. Severe left and moderate right neural foraminal stenosis at L3-L4 and L5-S1. 4. 5.9 x 2.0 x 2.0 cm fluid collection spanning the L4-L5 interspinous space, at the site of prior laminectomy, which could reflect a postoperative seroma, although abscess is not excluded. This is unchanged compared to 04/22/22 Electronically Signed   By: Lorenza Cambridge M.D.   On: 12/03/2022 12:41    EKG: Independently reviewed.  Sinus, no acute ST changes.  Assessment/Plan Principal Problem:   Osteomyelitis Active Problems:   Osteomyelitis of thoracic region  (please populate well all problems here in Problem List. (For example, if patient is on BP meds at home and you resume or decide to hold them, it is a problem that needs to be her. Same for CAD, COPD, HLD and so on)  Recurrent discitis/osteomyelitis on lower thoracic and upper lumbar spine -Failed outpatient suppressive antibiotic treatment -Involve T10-12, L1-2 and L2-3 level. Neurosurgery consulted and who recommends conservative management -ID consulted, waiting for ID input. Expecting thoracic/lumbar biopsy,  will hold off ABX for now -Continue pain control -No significant neurological deficit as of now  Thrombocytosis -Likely reactive from the discitis/osteomyelitis  Chronic back pain and narcotic dependence -Continue home pain medication regimen   DVT prophylaxis: Lovenox Code Status: Full code Family Communication: None at bedside Disposition Plan: Patient is sick with recurrent back infection failed outpatient antibiotic treatment, requiring inpatient infectious disease consult, expect more than 2 midnight hospital stay. Consults called: Neurosurgery, infectious disease Admission status: Medsurg admit   Emeline General MD [facility] Pager [phone]  12/03/2022, 2:24 PM

## 2022-12-03 NOTE — Progress Notes (Signed)
Discussed with on-call infectious disease Dr. Earlene Plater, who recommend hold off antibiotics and ordered IR guided lumbar spine/thoracic spine biopsy  Neurosurgery consultation appreciated, who also recommended IR biopsy.

## 2022-12-04 ENCOUNTER — Inpatient Hospital Stay (HOSPITAL_COMMUNITY): Payer: Commercial Managed Care - PPO

## 2022-12-04 DIAGNOSIS — Z79899 Other long term (current) drug therapy: Secondary | ICD-10-CM

## 2022-12-04 DIAGNOSIS — M4620 Osteomyelitis of vertebra, site unspecified: Secondary | ICD-10-CM

## 2022-12-04 DIAGNOSIS — M4624 Osteomyelitis of vertebra, thoracic region: Secondary | ICD-10-CM

## 2022-12-04 DIAGNOSIS — M549 Dorsalgia, unspecified: Secondary | ICD-10-CM | POA: Diagnosis not present

## 2022-12-04 DIAGNOSIS — M8668 Other chronic osteomyelitis, other site: Secondary | ICD-10-CM | POA: Diagnosis not present

## 2022-12-04 LAB — CBC
HCT: 37.1 % — ABNORMAL LOW (ref 39.0–52.0)
Hemoglobin: 12.3 g/dL — ABNORMAL LOW (ref 13.0–17.0)
MCH: 28.2 pg (ref 26.0–34.0)
MCHC: 33.2 g/dL (ref 30.0–36.0)
MCV: 85.1 fL (ref 80.0–100.0)
Platelets: 429 10*3/uL — ABNORMAL HIGH (ref 150–400)
RBC: 4.36 MIL/uL (ref 4.22–5.81)
RDW: 14.5 % (ref 11.5–15.5)
WBC: 10.1 10*3/uL (ref 4.0–10.5)
nRBC: 0 % (ref 0.0–0.2)

## 2022-12-04 LAB — CULTURE, BLOOD (ROUTINE X 2)

## 2022-12-04 MED ORDER — OXYCODONE-ACETAMINOPHEN 5-325 MG PO TABS
1.0000 | ORAL_TABLET | Freq: Four times a day (QID) | ORAL | Status: DC | PRN
  Administered 2022-12-04 – 2022-12-09 (×13): 2 via ORAL
  Filled 2022-12-04 (×13): qty 2

## 2022-12-04 MED ORDER — ONDANSETRON HCL 4 MG/2ML IJ SOLN: 4.0000 mg | Freq: Four times a day (QID) | INTRAMUSCULAR | Status: DC | PRN

## 2022-12-04 MED ORDER — ONDANSETRON HCL 4 MG/2ML IJ SOLN
4.0000 mg | Freq: Four times a day (QID) | INTRAMUSCULAR | Status: DC | PRN
  Administered 2022-12-06 – 2022-12-07 (×3): 4 mg via INTRAVENOUS
  Filled 2022-12-04 (×3): qty 2

## 2022-12-04 MED ORDER — LORAZEPAM 2 MG/ML IJ SOLN
1.0000 mg | Freq: Once | INTRAMUSCULAR | Status: AC
  Administered 2022-12-04: 1 mg via INTRAVENOUS
  Filled 2022-12-04: qty 1

## 2022-12-04 NOTE — Hospital Course (Signed)
John Ware is a 57 y.o. male with medical history significant of tricuspid valve endocarditis, thoracic and lumbar spine osteomyelitis/ discitis status post IV antibiotics on chronic suppressive antibiotic treatment, history of MSSA and Pseudomonas infection, chronic narcotic dependence, chronic pain presented to the hospital with worsening back pain for 1 week with no bowel or bladder incontinence.  Of note patient was recently seen by infectious disease 2 weeks back and is on chronic Cipro and Doxy suppressive treatment.  In the ED patient was afebrile.  Thoracic spine and lumbar spine MRI showed discitis osteomyelitis in the lower thoracic spine T10-12, early discitis L1-L3 5.9 x 2.0 x 2.0 cm fluid collection spanning L4-L5.  Neurosurgery and infectious disease was consulted from the ED and plan was admission to the hospital for further evaluation and treatment.   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  Imaging showing involvement of T10-12, L1-2 and L2-3 level. Neurosurgery and ID recommend IR guided aspiration and biopsy and holding off on antibiotic at this time.  Continue pain control.   Thrombocytosis -Likely reactive from the discitis/osteomyelitis, we will continue to monitor   Chronic back pain and narcotic dependence Continue baclofen, Tylenol, gabapentin, MS Contin.  On as needed Dilaudid as well.

## 2022-12-04 NOTE — Consult Note (Signed)
Chief Complaint: Patient was seen in consultation today for  Chief Complaint  Patient presents with   Back Pain   Referring Physician(s): Dr. Chipper Herb  Supervising Physician: Julieanne Cotton  Patient Status: [facility] - In-pt  History of Present Illness: John Ware is a 57 y.o. male with a medical history significant for tricuspid valve endocarditis, thoracic and lumbar spine osteomyelitis discitis status post IV antibiotics now on chronic suppressive antibiotic treatment, MSSA and Pseudomonas infection, chronic narcotic dependence and chronic pain. He is followed by Infectious Disease.  He is familiar to IR from a prior disc aspiration 04/24/22.   He presented to the ED 12/03/22 with worsening back pain. Imaging showed discitis/osteomyelitis.   MR Lumbar Spine without contrast 12/03/22 IMPRESSION: Incomplete exam. Axial T1 weighted sequences were not acquired. 1. Findings of discitis osteomyelitis in the lower thoracic spine at the T10-T12 vertebral body levels, with severe spinal canal stenosis at these levels secondary to the presence of abnormal fluid within the ventral and dorsal epidural space, most likely epidural phlegmon. There is an abnormal T2 hyperintense collection in the ventral epidural space at the L1 vertebral body level, which is nonspecific, but inferior extension of infection is not excluded. These findinsg are unchanged from 05/01/22. 2. Abnormal STIR hyperintense signal in the L1-L2 and L2-L3 disc spaces could suggest early discitis. 3. Severe bilateral neural foraminal stenosis at L2-L3, which is a junctional level. Severe left and moderate right neural foraminal stenosis at L3-L4 and L5-S1. 4. 5.9 x 2.0 x 2.0 cm fluid collection spanning the L4-L5 interspinous space, at the site of prior laminectomy, which could reflect a postoperative seroma, although abscess is not excluded. This is unchanged compared to 04/22/22  Neuro Interventional Radiology has been  asked to evaluate this patient for an image-guided disc aspiration and bone biopsy. Imaging reviewed and procedures approved by Dr. Corliss Skains. Patient has been approved for a T12-L1 disc aspiration and a T12 bone biopsy.   Past Medical History:  Diagnosis Date   Depression    Diabetes mellitus without complication    Hypertension    Spinal stenosis    With Neurogenic Claudication    Past Surgical History:  Procedure Laterality Date   CARPAL TUNNEL RELEASE Bilateral    INCISION AND DRAINAGE ABSCESS Left 03/12/2021   Procedure: INCISION AND DRAINAGE ABSCESS;  Surgeon: [name], MD;  Location: [facility];  Service: Orthopedics;  Laterality: Left;   IR FLUORO GUIDED NEEDLE PLC ASPIRATION/INJECTION LOC  04/24/2022   IR US GUIDE BX ASP/DRAIN  02/02/2021   IRRIGATION AND DEBRIDEMENT ABSCESS Left 02/08/2021   Procedure: MINOR INCISION AND DRAINAGE OF ABSCESS;  Surgeon: [name], MD;  Location: [facility];  Service: Orthopedics;  Laterality: Left;   IRRIGATION AND DEBRIDEMENT ABSCESS Left 02/10/2021   Procedure: IRRIGATION AND DEBRIDEMENT ABSCESS LEFT LATERAL CHEST WALL;  Surgeon: [name], MD;  Location: [facility];  Service: General;  Laterality: Left;   IRRIGATION AND DEBRIDEMENT ABSCESS Bilateral 02/20/2021   Procedure: MINOR INCISION AND DRAINAGE OF ABSCESS/WITH BILATERAL WOUND VAC PLACEMENT;  Surgeon: [name], MD;  Location: [facility];  Service: Orthopedics;  Laterality: Bilateral;   IRRIGATION AND DEBRIDEMENT SHOULDER Bilateral 02/28/2021   Procedure: IRRIGATION AND DEBRIDEMENT SHOULDER;  Surgeon: [name], MD;  Location: [facility];  Service: Orthopedics;  Laterality: Bilateral;   IRRIGATION AND DEBRIDEMENT SHOULDER Left 03/06/2021   Procedure: IRRIGATION AND DEBRIDEMENT SHOULDER;  Surgeon: [name], MD;  Location: [facility];  Service: Orthopedics;  Laterality: Left;  IRRIGATION AND DEBRIDEMENT SHOULDER Left 03/08/2021   Procedure: IRRIGATION AND DEBRIDEMENT SHOULDER;  Surgeon:  [name], MD;  Location: [facility];  Service: Orthopedics;  Laterality: Left;   LUMBAR LAMINECTOMY FOR EPIDURAL ABSCESS N/A 05/02/2022   Procedure: THORACIC  LAMINECTOMY AND DEBRIDEMENT  FOR EPIDURAL ABSCESS;  Surgeon: [name], MD;  Location: [facility];  Service: Neurosurgery;  Laterality: N/A;   LUMBAR LAMINECTOMY/DECOMPRESSION MICRODISCECTOMY N/A 12/13/2014   Procedure: LUMBAR THREE-FOUR, LUMBAR FOUR-FIVE, LUMBAR FIVE-SACRAL ONE LAMINECTOMY;  Surgeon: [name], MD;  Location: [facility];  Service: Neurosurgery;  Laterality: N/A;  L3-4 L4-5 L5-S1 Laminectomy   LUMBAR LAMINECTOMY/DECOMPRESSION MICRODISCECTOMY Left 06/05/2021   Procedure: Left lumbar one-two, Lumbar two-three Laminectomy;  Surgeon: [name], MD;  Location: 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 Authorizing Provider  acetaminophen (TYLENOL) 500 MG tablet Take 1 tablet (500 mg total) by mouth every 6 (six) hours as needed. 06/26/21   Love, Evlyn Kanner, PA-C  Baclofen 5 MG TABS Take 1 tablet by mouth 3 (three) times daily. Patient not taking: Reported on 08/16/2022 05/16/22   Love, Evlyn Kanner, PA-C   Baclofen 5 MG TABS Take 1 tablet by mouth 3 (three) times daily. 08/21/22     Baclofen 5 MG TABS Take 1 tablet (5 mg total) by mouth 3 (three) times daily. 11/13/22     ciprofloxacin (CIPRO) 250 MG tablet Take 1 tablet (250 mg total) by mouth 2 (two) times daily. 09/26/22   Odette Fraction, MD  ciprofloxacin (CIPRO) 500 MG tablet Take 1 tablet (500 mg total) by mouth 2 (two) times daily. 09/26/22   Odette Fraction, MD  Continuous Blood Gluc Sensor (FREESTYLE LIBRE 2 SENSOR) MISC CHECK BLOOD SUGAR 4 TIMES A DAY 08/22/22   Hawks, Neysa Bonito A, FNP  doxycycline (VIBRA-TABS) 100 MG tablet Take 1 tablet (100 mg total) by mouth 2 (two) times daily. 07/03/22   Odette Fraction, MD  DULoxetine (CYMBALTA) 60 MG capsule Take 1 capsule (60 mg total) by mouth at bedtime. Patient not taking: Reported on 08/16/2022 05/16/22   Love, Evlyn Kanner, PA-C  DULoxetine (CYMBALTA) 60 MG capsule Take 1 capsule (60 mg total) by mouth at bedtime. 11/13/22     gabapentin (NEURONTIN) 400 MG capsule Take 1 capsule (400 mg total) by mouth 2 (two) times daily. 08/16/22   Junie Spencer, FNP  hydrOXYzine (ATARAX) 10 MG tablet Take 1 tablet (10 mg total) by mouth 3 (three) times daily. Patient not taking: Reported on 08/16/2022 05/16/22   Love, Evlyn Kanner, PA-C  hydrOXYzine (ATARAX) 10 MG tablet Take 1 tablet (10 mg total) by mouth 3 (three) times daily. 08/21/22  hydrOXYzine (ATARAX) 10 MG tablet Take 1 tablet (10 mg total) by mouth 3 (three) times daily. 11/13/22     methadone (DOLOPHINE) 5 MG tablet Take 3 tablets (15 mg total) by mouth every 12 (twelve) hours. Patient not taking: Reported on 08/16/2022 05/16/22   Love, Evlyn Kanner, PA-C  morphine (MS CONTIN) 15 MG 12 hr tablet Take 15 mg by mouth 3 (three) times daily. 06/25/22   [provider]  morphine (MS CONTIN) 30 MG 12 hr tablet Take 1 tablet by mouth every twelve hours 11/13/22     naloxone (NARCAN) 0.4 MG/ML injection Inject 1 mL (0.4 mg total) into the vein as  needed. Patient not taking: Reported on 08/16/2022 06/26/21   Love, Evlyn Kanner, PA-C  oxyCODONE-acetaminophen (PERCOCET) 10-325 MG tablet Take 1 tablet by mouth every 6 (six) hours as needed (breakthrough pain). 05/17/22   Love, Evlyn Kanner, PA-C  oxyCODONE-acetaminophen (PERCOCET) 10-325 MG tablet Take 1 tablet by mouth every 6 (six) hours. Stop taking methadone. 05/23/22     oxyCODONE-acetaminophen (PERCOCET) 10-325 MG tablet Take 1 tablet by mouth 5 (five) times daily. 07/24/22     oxyCODONE-acetaminophen (PERCOCET) 10-325 MG tablet Take 1 tablet by mouth 5 (five) times daily. 08/21/22     oxyCODONE-acetaminophen (PERCOCET) 10-325 MG tablet Take 1 tablet by mouth 5 (five) times daily. 09/18/22     oxyCODONE-acetaminophen (PERCOCET) 10-325 MG tablet Take 1 tablet by mouth 5 (five) times daily. 10/16/22     oxyCODONE-acetaminophen (PERCOCET) 10-325 MG tablet Take 1 tablet by mouth five times a day 11/13/22        Family History  Problem Relation Age of Onset   Cancer Mother    Colon cancer Neg Hx     Social History   Socioeconomic History   Marital status: Married    Spouse name: Not on file   Number of children: Not on file   Years of education: Not on file   Highest education level: Not on file  Occupational History   Not on file  Tobacco Use   Smoking status: Former    Packs/day: 2.00    Years: 15.00    Additional pack years: 0.00    Total pack years: 30.00    Types: Cigarettes    Quit date: 2012    Years since quitting: 12.2   Smokeless tobacco: Never  Vaping Use   Vaping Use: Never used  Substance and Sexual Activity   Alcohol use: Yes    Comment: occasional   Drug use: No   Sexual activity: Not Currently  Other Topics Concern   Not on file  Social History Narrative   Not on file   Social Determinants of Health   Financial Resource Strain: Not on file  Food Insecurity: No Food Insecurity (12/03/2022)   Hunger Vital Sign    Worried About Running Out of Food in the Last  Year: Never true    Ran Out of Food in the Last Year: Never true  Transportation Needs: No Transportation Needs (12/03/2022)   PRAPARE - Administrator, Civil Service (Medical): No    Lack of Transportation (Non-Medical): No  Physical Activity: Not on file  Stress: Not on file  Social Connections: Not on file    Review of Systems: A 12 point ROS discussed and pertinent positives are indicated in the HPI above.  All other systems are negative.  Review of Systems  Constitutional:  Negative for appetite change and fatigue.  Respiratory:  Negative  for cough and shortness of breath.   Cardiovascular:  Negative for chest pain and leg swelling.  Gastrointestinal:  Positive for abdominal pain and nausea. Negative for diarrhea and vomiting.  Musculoskeletal:  Positive for back pain.  Neurological:  Negative for dizziness and headaches.    Vital Signs: BP (!) 154/109 (BP Location: Left Arm)   Pulse 100   Temp 98.9 F (37.2 C) (Oral)   Resp 18   Ht 5\' 10"  (1.778 m)   Wt 183 lb 3.2 oz (83.1 kg)   SpO2 100%   BMI 26.29 kg/m   Physical Exam Constitutional:      General: He is not in acute distress.    Appearance: He is not ill-appearing.  HENT:     Mouth/Throat:     Mouth: Mucous membranes are moist.     Pharynx: Oropharynx is clear.  Cardiovascular:     Rate and Rhythm: Normal rate and regular rhythm.     Pulses: Normal pulses.     Heart sounds: Normal heart sounds.  Pulmonary:     Effort: Pulmonary effort is normal.     Breath sounds: Normal breath sounds.  Abdominal:     General: Bowel sounds are normal.     Palpations: Abdomen is soft.     Tenderness: There is no abdominal tenderness.  Musculoskeletal:     Right lower leg: No edema.     Left lower leg: No edema.  Skin:    General: Skin is warm and dry.  Neurological:     Mental Status: He is alert and oriented to person, place, and time.  Psychiatric:        Mood and Affect: Mood normal.        Behavior:  Behavior normal.        Thought Content: Thought content normal.        Judgment: Judgment normal.     Imaging: CT Angio Chest/Abd/Pel for Dissection W and/or W/WO  Result Date: 12/03/2022 CLINICAL DATA:  Mid back pain, concern for aneurysm or dissection EXAM: CT ANGIOGRAPHY CHEST, ABDOMEN AND PELVIS TECHNIQUE: Non-contrast CT of the chest was initially obtained. Multidetector CT imaging through the chest, abdomen and pelvis was performed using the standard protocol during bolus administration of intravenous contrast. Multiplanar reconstructed images and MIPs were obtained and reviewed to evaluate the vascular anatomy. RADIATION DOSE REDUCTION: This exam was performed according to the departmental dose-optimization program which includes automated exposure control, adjustment of the mA and/or kV according to patient size and/or use of iterative reconstruction technique. CONTRAST:  OMNIPAQUE IOHEXOL 350 MG/ML SOLN COMPARISON:  None Available. FINDINGS: CTA CHEST FINDINGS VASCULAR Aorta: Satisfactory opacification of the aorta. Normal contour and caliber of the thoracic aorta. No evidence of aneurysm, dissection, or other acute aortic pathology. Minimal scattered aortic atherosclerosis. Cardiovascular: No evidence of pulmonary embolism on limited non-tailored examination. Normal heart size. No pericardial effusion. Review of the MIP images confirms the above findings. NON VASCULAR Mediastinum/Nodes: No enlarged mediastinal, hilar, or axillary lymph nodes. Thyroid gland, trachea, and esophagus demonstrate no significant findings. Lungs/Pleura: Lungs are clear. No pleural effusion or pneumothorax. Musculoskeletal: Extensive disc degenerative disease and bridging osteophytosis throughout the thoracic and lumbar spine, in keeping with DISH. Paraspinous soft tissue thickening about T10 through T12 with extensive underlying bony sclerosis and erosion, particularly involving the T11-T12 disc space (series 7,  image 116, series 11, image 112). Review of the MIP images confirms the above findings. CTA ABDOMEN AND PELVIS FINDINGS VASCULAR Mildly tortuous  abdominal aorta. Otherwise normal contour and caliber of the abdominal aorta. No evidence of aneurysm, dissection, or other acute aortic pathology. Standard branching pattern of the abdominal aorta with solitary bilateral renal arteries. Review of the MIP images confirms the above findings. NON-VASCULAR Hepatobiliary: No solid liver abnormality is seen. No gallstones, gallbladder wall thickening, or biliary dilatation. Pancreas: Unremarkable. No pancreatic ductal dilatation or surrounding inflammatory changes. Spleen: Normal in size without significant abnormality. Adrenals/Urinary Tract: Adrenal glands are unremarkable. Kidneys are normal, without renal calculi, solid lesion, or hydronephrosis. Bladder is unremarkable. Stomach/Bowel: Stomach is within normal limits. Appendix appears normal. No evidence of bowel wall thickening, distention, or inflammatory changes. Lymphatic: No enlarged abdominal or pelvic lymph nodes. Reproductive: No mass or other significant abnormality. Other: No abdominal wall hernia or abnormality. No ascites. Musculoskeletal: No acute osseous findings. Posterior discectomy, laminectomy, and fusion of L3 through S1. IMPRESSION: 1. Normal contour and caliber of the thoracic and abdominal aorta. No evidence of aneurysm, dissection, or other acute aortic pathology. Minimal scattered aortic atherosclerosis. 2. Paraspinous soft tissue thickening about T10 through T12 with extensive underlying bony sclerosis and erosion, particularly involving the T11-T12 disc space. Findings are consistent with discitis/osteomyelitis and better assessed by same-day MR examination of the thoracic and lumbar spine. Aortic Atherosclerosis (ICD10-I70.0). Electronically Signed   By: Jearld Lesch M.D.   On: 12/03/2022 13:11   MR LUMBAR SPINE WO CONTRAST  Result Date:  12/03/2022 CLINICAL DATA:  Concern for osteomyelitis. EXAM: MRI LUMBAR SPINE WITHOUT CONTRAST TECHNIQUE: Multiplanar, multisequence MR imaging of the lumbar spine was performed. No intravenous contrast was administered. COMPARISON:  MRI T Spine 05/01/22, MRI L Spine 04/22/22 FINDINGS: Limitations: Assessment is somewhat limited due to incomplete exam. Axial T1 weighted sequences were not acquired. Segmentation: There is lumbarization of S1 with a small disc space at S1-S2. Alignment:  Grade 1 anterolisthesis of L5 on S1. Vertebrae: There are postsurgical changes from L3-S1 posterior spinal fusion. Conus medullaris and cauda equina: Conus extends to the L1 level. Conus and cauda equina appear normal. Paraspinal and other soft tissues: There is abnormal T2/stir hyperintense signal surrounding the T11 and T12 vertebral bodies (series 5, image 3, 19). There is a 5.9 x 2.0 x 2.0 cm fluid collection spanning the L4-L5 interspinous space, at the site of prior laminectomy. Abnormal STIR hyperintense signal is also present in the L2-L3 disc space (series 5, image 9), which could suggest early discitis at this level, as well as at L1-L2. Disc levels: Redemonstrated are findings of discitis osteomyelitis in the lower thoracic spine at the T10-T12 vertebral body levels. There is abnormal T2/stir hyperintense signal at these levels, compatible with discitis. There is irregular endplate erosion, compatible with osteomyelitis. There is severe spinal canal stenosis at these levels secondary to the presence of abnormal fluid within the ventral and dorsal epidural space (series 8, image 6), most likely epidural phlegmon. There is an abnormal T2 hyperintense collection in the ventral epidural space at the L1 vertebral body level (series 8, image 15), which is nonspecific, but infection is not excluded. There is severe bilateral neural foraminal stenosis at L2-L3, which is a junctional level. There is severe left and moderate right neural  foraminal stenosis at L3-L4 and L5-S1. Evidence of high-grade spinal canal stenosis. IMPRESSION: Incomplete exam. Axial T1 weighted sequences were not acquired. 1. Findings of discitis osteomyelitis in the lower thoracic spine at the T10-T12 vertebral body levels, with severe spinal canal stenosis at these levels secondary to the presence of abnormal fluid  within the ventral and dorsal epidural space, most likely epidural phlegmon. There is an abnormal T2 hyperintense collection in the ventral epidural space at the L1 vertebral body level, which is nonspecific, but inferior extension of infection is not excluded. These findinsg are unchanged from 05/01/22. 2. Abnormal STIR hyperintense signal in the L1-L2 and L2-L3 disc spaces could suggest early discitis. 3. Severe bilateral neural foraminal stenosis at L2-L3, which is a junctional level. Severe left and moderate right neural foraminal stenosis at L3-L4 and L5-S1. 4. 5.9 x 2.0 x 2.0 cm fluid collection spanning the L4-L5 interspinous space, at the site of prior laminectomy, which could reflect a postoperative seroma, although abscess is not excluded. This is unchanged compared to 04/22/22 Electronically Signed   By: Lorenza CambridgeHemant  Desai M.D.   On: 12/03/2022 12:41    Labs:  CBC: Recent Labs    09/25/22 0249 11/20/22 1448 12/03/22 1047 12/04/22 0212  WBC 7.3 8.3 10.7* 10.1  HGB 13.2 14.2 13.2 12.3*  HCT 37.7* 40.7 40.4 37.1*  PLT 416* 424* 475* 429*    COAGS: Recent Labs    04/24/22 0956  INR 1.1    BMP: Recent Labs    04/30/22 0420 05/09/22 0358 05/13/22 0420 07/03/22 1427 07/31/22 1401 09/25/22 0249 11/20/22 1448 12/03/22 1047  NA 136 137 134*   < > 140 137 136 140  K 4.2 3.9 4.2   < > 4.5 4.7 4.5 3.9  CL 103 99 96*   < > 101 100 97* 100  CO2 24 28 28    < > 29 27 27 27   GLUCOSE 117* 153* 97   < > 126* 162* 139* 213*  BUN 10 7 9    < > 10 9 9 7   CALCIUM 9.2 9.2 9.3   < > 10.0 9.8 10.0 9.2  CREATININE 0.64 0.53* 0.57*   < > 0.72 0.63*  0.75 0.72  GFRNONAA >60 >60 >60  --   --   --   --  >60   < > = values in this interval not displayed.    LIVER FUNCTION TESTS: Recent Labs    04/22/22 1451 04/23/22 0257 05/09/22 0358 12/03/22 1047  BILITOT 0.6 0.4 0.5 0.5  AST 15 13* 14* 24  ALT 14 11 18 18   ALKPHOS 88 75 92 123  PROT 8.4* 7.1 6.7 7.6  ALBUMIN 3.5 3.1* 2.8* 3.7    TUMOR MARKERS: No results for input(s): "AFPTM", "CEA", "CA199", "CHROMGRNA" in the last 8760 hours.  Assessment and Plan:  Discitis/Osteomyelitis: John Ware, 57 year old male, is tentatively scheduled 12/05/22 for an image-guided T12-L1 disc aspiration and a T12 bone biopsy.   Risks and benefits of these procedures were discussed with the patient and/or patient's family including, but not limited to bleeding, infection, damage to adjacent structures or low yield requiring additional tests.  All of the questions were answered and there is agreement to proceed. He will be NPO at midnight. Labs ordered for the morning.   Consent signed and in chart.  Thank you for this interesting consult.  I greatly enjoyed meeting John Ware and look forward to participating in their care.  A copy of this report was sent to the requesting provider on this date.  Electronically Signed: Alwyn RenJamie Benney [place], AGACNP-BC [phone] 12/04/2022, 12:15 PM   I spent a total of 20 Minutes    in face to face in clinical consultation, greater than 50% of which was counseling/coordinating care for disc aspiration/bone biopsy.

## 2022-12-04 NOTE — Plan of Care (Signed)

## 2022-12-04 NOTE — Consult Note (Addendum)
I have seen and examined the patient. I have personally reviewed the clinical findings, laboratory findings, microbiological data and imaging studies. The assessment and treatment plan was discussed with the Nurse Practitioner Jeanine Luz. I agree with her/his recommendations except following additions/corrections.  Patient well known to me from prior hospitalizations as well as clinic visit on suppressive PO doxycycline and ciprofloxacin admitted with severe back pain. Last dose of abtx was Sunday evening. Denies fevers, chills, night sweats. Denies nausea, vomiting and malaise. Not considered a neurosurgical candidate per Neurosurgery. He is not septic at this time. Recommend to hold off abtx for at least 3 days prior to IR guided aspiration and biopsy ( 4/11 or after) . Please send cultures and path as appropriate. Fu blood cx  Exam - adult male lying in the bed and appears to be in pain              HEENT wnl              Normal heart and lung sounds              Abdomen non distended and non tender              Skin - no rashes              MSK - no pedal edema, no signs of septic joint               Neuro - awake, alert and oriented, follows commands. Power B/L LE  4 to 4+/5  D/w primary team   John Fraction, MD Infectious Disease Physician [facility] [address] [place], [place] [address] Phone: [phone]  Fax: [phone]    [facility]    Date of Admission:  12/03/2022     Total days of antibiotics 0               Reason for Consult: Discitis/Osteomyelitis   Referring Provider: Dr. Chipper Herb  Primary Care Provider: Junie Spencer, FNP   ASSESSMENT:  John Ware is a 57 y/o male admitted with worsening back pain the setting of previous osteomyelitis/discitis on suppressive antibiotics with ciprofloxacin and doxycyline last taken on 4/7 and concern for persistent/recurrent  infection and possible phelgmon. No surgical interventions recommended and have requested IR evaluation which can be considered starting tomorrow as he should have ample "washout" time from previous antibiotics to optimize cultures. Continue to hold antibiotics at this time pending IR aspiration/biopsy. Pain control and remaining medical and supportive care per Internal Medicine.   PLAN:  Continue to hold antibiotics.  IR aspiration/biopsy tomorrow or later when available Monitor blood cultures for bacteremia. Pain control and remaining medical and supportive care per Internal Medicine.    Principal Problem:   Osteomyelitis Active Problems:   Osteomyelitis of thoracic region    baclofen  5 mg Oral TID   DULoxetine  60 mg Oral QHS   enoxaparin (LOVENOX) injection  40 mg Subcutaneous Q24H   gabapentin  400 mg Oral BID   morphine  15 mg Oral BID   mupirocin ointment  1 Application Nasal BID     HPI: John Ware is a 57 y.o. male with previous medical history of diabetes, hypertension, and disseminated MSSA infection admitted to the hospital with worsening back pain.  John Ware is known to the ID service with previous history of disseminated MSSA infection (bacteremia, native tricuspid valve  endocaditis, septic pulmonary emobli, psoas abscess, left shoulder septic arthritis) in June/July 2022 with superimposed Pseudomonas infection. Last seen in the ID clinic with Dr. Elinor Parkinson on 11/20/22 with good adherence and tolerance to ciprofloxacin and doxycycline which was continued.  John Ware presented to the ED on 12/03/22 with 4 day history of increasing mid to lower back pain with symptoms feeling similar to previous osteomyelitis. MRI lumbar spine with findings of discitis/osteomyetlis in the lower thoracic spine at T10-T12 with severe spinal stenosis and possible epidural phlegmon; possible early discitis at L1-L2 and L2-L3;and L4-L5 fluid collection possibly seroma although abscess  could not be excluded.  Neurosurgery consulted with no recommended surgical interventions and to consider IR aspiration/biopsy.  John Ware has been afebrile since admission. Blood cultures drawn are pending. Has been taking his doxycycline and ciprofloxacin with last dose being Sunday evening.    Review of Systems: Review of Systems  Constitutional:  Negative for chills, fever and weight loss.  Respiratory:  Negative for cough, shortness of breath and wheezing.   Cardiovascular:  Negative for chest pain and leg swelling.  Gastrointestinal:  Negative for abdominal pain, constipation, diarrhea, nausea and vomiting.  Musculoskeletal:  Positive for back pain.  Skin:  Negative for rash.     Past Medical History:  Diagnosis Date   Depression    Diabetes mellitus without complication    Hypertension    Spinal stenosis    With Neurogenic Claudication   Past Surgical History:  Procedure Laterality Date   CARPAL TUNNEL RELEASE Bilateral    INCISION AND DRAINAGE ABSCESS Left 03/12/2021   Procedure: INCISION AND DRAINAGE ABSCESS;  Surgeon: [name], MD;  Location: [facility];  Service: Orthopedics;  Laterality: Left;   IR FLUORO GUIDED NEEDLE PLC ASPIRATION/INJECTION LOC  04/24/2022   IR US GUIDE BX ASP/DRAIN  02/02/2021   IRRIGATION AND DEBRIDEMENT ABSCESS Left 02/08/2021   Procedure: MINOR INCISION AND DRAINAGE OF ABSCESS;  Surgeon: [name], MD;  Location: [facility];  Service: Orthopedics;  Laterality: Left;   IRRIGATION AND DEBRIDEMENT ABSCESS Left 02/10/2021   Procedure: IRRIGATION AND DEBRIDEMENT ABSCESS LEFT LATERAL CHEST WALL;  Surgeon: [name], MD;  Location: [facility];  Service: General;  Laterality: Left;   IRRIGATION AND DEBRIDEMENT ABSCESS Bilateral 02/20/2021   Procedure: MINOR INCISION AND DRAINAGE OF ABSCESS/WITH BILATERAL WOUND VAC PLACEMENT;  Surgeon: [name], MD;  Location: [facility];  Service: Orthopedics;  Laterality: Bilateral;   IRRIGATION AND DEBRIDEMENT  SHOULDER Bilateral 02/28/2021   Procedure: IRRIGATION AND DEBRIDEMENT SHOULDER;  Surgeon: [name], MD;  Location: [facility];  Service: Orthopedics;  Laterality: Bilateral;   IRRIGATION AND DEBRIDEMENT SHOULDER Left 03/06/2021   Procedure: IRRIGATION AND DEBRIDEMENT SHOULDER;  Surgeon: [name], MD;  Location: [facility];  Service: Orthopedics;  Laterality: Left;   IRRIGATION AND DEBRIDEMENT SHOULDER Left 03/08/2021   Procedure: IRRIGATION AND DEBRIDEMENT SHOULDER;  Surgeon: [name], MD;  Location: [facility];  Service: Orthopedics;  Laterality: Left;   LUMBAR LAMINECTOMY FOR EPIDURAL ABSCESS N/A 05/02/2022   Procedure: THORACIC  LAMINECTOMY AND DEBRIDEMENT  FOR EPIDURAL ABSCESS;  Surgeon: [name], MD;  Location: [facility];  Service: Neurosurgery;  Laterality: N/A;   LUMBAR LAMINECTOMY/DECOMPRESSION MICRODISCECTOMY N/A 12/13/2014   Procedure: LUMBAR THREE-FOUR, LUMBAR FOUR-FIVE, LUMBAR FIVE-SACRAL ONE LAMINECTOMY;  Surgeon: [name], MD;  Location: [facility];  Service: Neurosurgery;  Laterality: N/A;  L3-4 L4-5 L5-S1 Laminectomy   LUMBAR LAMINECTOMY/DECOMPRESSION MICRODISCECTOMY Left 06/05/2021   Procedure: Left lumbar one-two, Lumbar two-three Laminectomy;  Surgeon: [name], MD;  Location: [facility];  Service: Neurosurgery;  Laterality: Left;   MENISCUS REPAIR     Right knee   MINOR APPLICATION OF WOUND VAC Left 02/20/2021   Procedure: MINOR APPLICATION OF KERLIX PACKING/REMOVAL OF WOUND VAC;  Surgeon: [name], MD;  Location: [facility];  Service: General;  Laterality: Left;   TEE WITHOUT CARDIOVERSION N/A 02/05/2021   Procedure: TRANSESOPHAGEAL ECHOCARDIOGRAM (TEE);  Surgeon: Quintella Reichert, MD;  Location: [facility];  Service: Cardiovascular;  Laterality: N/A;   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   TOTAL SHOULDER ARTHROPLASTY Right 02/10/2021   Procedure: RIGHT OPEN SHOULDER I & D, OPEN DISTAL CLAVICLE RESECTION;  Surgeon: [name], MD;  Location: [facility];  Service: Orthopedics;  Laterality: Right;   VASECTOMY       Social History   Tobacco Use   Smoking status: Former    Packs/day: 2.00    Years: 15.00    Additional pack years: 0.00    Total pack years: 30.00    Types: Cigarettes    Quit date: 2012    Years since quitting: 12.2   Smokeless tobacco: Never  Vaping Use   Vaping Use: Never used  Substance Use Topics   Alcohol use: Yes    Comment: occasional   Drug use: No    Family History  Problem Relation Age of Onset   Cancer Mother    Colon cancer Neg Hx     Allergies  Allergen Reactions   Tizanidine Other (See Comments)    Leg cramps    OBJECTIVE: Blood pressure (!) 154/109, pulse 100, temperature 98.9 F (37.2 C), temperature source Oral, resp. rate 18, height 5\' 10"  (1.778 m), weight 83.1 kg, SpO2 100 %.  Physical Exam Constitutional:      General: He is not in acute distress.    Appearance: He is well-developed.  Cardiovascular:     Rate and Rhythm: Normal rate and regular rhythm.     Heart sounds: Normal heart sounds.  Pulmonary:     Effort: Pulmonary effort is normal.     Breath sounds: Normal breath sounds.  Skin:    General: Skin is warm and dry.  Neurological:     Mental Status: He is alert and oriented to person, place, and time.  Psychiatric:        Behavior: Behavior normal.        Thought Content: Thought content normal.        Judgment: Judgment normal.     Lab Results Lab Results  Component Value Date   WBC 10.1 12/04/2022   HGB 12.3 (L) 12/04/2022   HCT 37.1 (L) 12/04/2022   MCV 85.1 12/04/2022   PLT 429 (H) 12/04/2022    Lab Results  Component Value Date   CREATININE 0.72 12/03/2022   BUN 7 12/03/2022   NA 140 12/03/2022   K 3.9 12/03/2022   CL 100 12/03/2022   CO2 27 12/03/2022    Lab Results  Component Value Date   ALT 18 12/03/2022   AST 24 12/03/2022   ALKPHOS 123 12/03/2022   BILITOT 0.5 12/03/2022     Microbiology: Recent Results (from the past 240  hour(s))  Blood culture (routine x 2)     Status: None (Preliminary result)   Collection Time: 12/03/22 10:41 AM   Specimen: BLOOD LEFT ARM  Result Value Ref Range Status   Specimen Description BLOOD LEFT ARM  Final   Special  Requests   Final    BOTTLES DRAWN AEROBIC AND ANAEROBIC Blood Culture adequate volume   Culture   Final    NO GROWTH < 24 HOURS Performed at [facility], [address] [place], [place] [address]    Report Status PENDING  Incomplete  Blood culture (routine x 2)     Status: None (Preliminary result)   Collection Time: 12/03/22 10:47 AM   Specimen: BLOOD RIGHT ARM  Result Value Ref Range Status   Specimen Description BLOOD RIGHT ARM  Final   Special Requests   Final    BOTTLES DRAWN AEROBIC AND ANAEROBIC Blood Culture adequate volume   Culture   Final    NO GROWTH < 24 HOURS Performed at [facility], [address] [place], [place] [address]    Report Status PENDING  Incomplete   Imaging CT Angio Chest/Abd/Pel for Dissection W and/or W/WO  Result Date: 12/03/2022 CLINICAL DATA:  Mid back pain, concern for aneurysm or dissection EXAM: CT ANGIOGRAPHY CHEST, ABDOMEN AND PELVIS TECHNIQUE: Non-contrast CT of the chest was initially obtained. Multidetector CT imaging through the chest, abdomen and pelvis was performed using the standard protocol during bolus administration of intravenous contrast. Multiplanar reconstructed images and MIPs were obtained and reviewed to evaluate the vascular anatomy. RADIATION DOSE REDUCTION: This exam was performed according to the departmental dose-optimization program which includes automated exposure control, adjustment of the mA and/or kV according to patient size and/or use of iterative reconstruction technique. CONTRAST:  100mL OMNIPAQUE IOHEXOL 350 MG/ML SOLN COMPARISON:  None Available. FINDINGS: CTA CHEST FINDINGS VASCULAR Aorta: Satisfactory opacification of the aorta. Normal contour and caliber of the thoracic  aorta. No evidence of aneurysm, dissection, or other acute aortic pathology. Minimal scattered aortic atherosclerosis. Cardiovascular: No evidence of pulmonary embolism on limited non-tailored examination. Normal heart size. No pericardial effusion. Review of the MIP images confirms the above findings. NON VASCULAR Mediastinum/Nodes: No enlarged mediastinal, hilar, or axillary lymph nodes. Thyroid gland, trachea, and esophagus demonstrate no significant findings. Lungs/Pleura: Lungs are clear. No pleural effusion or pneumothorax. Musculoskeletal: Extensive disc degenerative disease and bridging osteophytosis throughout the thoracic and lumbar spine, in keeping with DISH. Paraspinous soft tissue thickening about T10 through T12 with extensive underlying bony sclerosis and erosion, particularly involving the T11-T12 disc space (series 7, image 116, series 11, image 112). Review of the MIP images confirms the above findings. CTA ABDOMEN AND PELVIS FINDINGS VASCULAR Mildly tortuous abdominal aorta. Otherwise normal contour and caliber of the abdominal aorta. No evidence of aneurysm, dissection, or other acute aortic pathology. Standard branching pattern of the abdominal aorta with solitary bilateral renal arteries. Review of the MIP images confirms the above findings. NON-VASCULAR Hepatobiliary: No solid liver abnormality is seen. No gallstones, gallbladder wall thickening, or biliary dilatation. Pancreas: Unremarkable. No pancreatic ductal dilatation or surrounding inflammatory changes. Spleen: Normal in size without significant abnormality. Adrenals/Urinary Tract: Adrenal glands are unremarkable. Kidneys are normal, without renal calculi, solid lesion, or hydronephrosis. Bladder is unremarkable. Stomach/Bowel: Stomach is within normal limits. Appendix appears normal. No evidence of bowel wall thickening, distention, or inflammatory changes. Lymphatic: No enlarged abdominal or pelvic lymph nodes. Reproductive: No mass  or other significant abnormality. Other: No abdominal wall hernia or abnormality. No ascites. Musculoskeletal: No acute osseous findings. Posterior discectomy, laminectomy, and fusion of L3 through S1. IMPRESSION: 1. Normal contour and caliber of the thoracic and abdominal aorta. No evidence of aneurysm, dissection, or other acute aortic pathology. Minimal scattered aortic atherosclerosis.  2. Paraspinous soft tissue thickening about T10 through T12 with extensive underlying bony sclerosis and erosion, particularly involving the T11-T12 disc space. Findings are consistent with discitis/osteomyelitis and better assessed by same-day MR examination of the thoracic and lumbar spine. Aortic Atherosclerosis (ICD10-I70.0). Electronically Signed   By: Jearld Lesch M.D.   On: 12/03/2022 13:11   MR LUMBAR SPINE WO CONTRAST  Result Date: 12/03/2022 CLINICAL DATA:  Concern for osteomyelitis. EXAM: MRI LUMBAR SPINE WITHOUT CONTRAST TECHNIQUE: Multiplanar, multisequence MR imaging of the lumbar spine was performed. No intravenous contrast was administered. COMPARISON:  MRI T Spine 05/01/22, MRI L Spine 04/22/22 FINDINGS: Limitations: Assessment is somewhat limited due to incomplete exam. Axial T1 weighted sequences were not acquired. Segmentation: There is lumbarization of S1 with a small disc space at S1-S2. Alignment:  Grade 1 anterolisthesis of L5 on S1. Vertebrae: There are postsurgical changes from L3-S1 posterior spinal fusion. Conus medullaris and cauda equina: Conus extends to the L1 level. Conus and cauda equina appear normal. Paraspinal and other soft tissues: There is abnormal T2/stir hyperintense signal surrounding the T11 and T12 vertebral bodies (series 5, image 3, 19). There is a 5.9 x 2.0 x 2.0 cm fluid collection spanning the L4-L5 interspinous space, at the site of prior laminectomy. Abnormal STIR hyperintense signal is also present in the L2-L3 disc space (series 5, image 9), which could suggest early discitis  at this level, as well as at L1-L2. Disc levels: Redemonstrated are findings of discitis osteomyelitis in the lower thoracic spine at the T10-T12 vertebral body levels. There is abnormal T2/stir hyperintense signal at these levels, compatible with discitis. There is irregular endplate erosion, compatible with osteomyelitis. There is severe spinal canal stenosis at these levels secondary to the presence of abnormal fluid within the ventral and dorsal epidural space (series 8, image 6), most likely epidural phlegmon. There is an abnormal T2 hyperintense collection in the ventral epidural space at the L1 vertebral body level (series 8, image 15), which is nonspecific, but infection is not excluded. There is severe bilateral neural foraminal stenosis at L2-L3, which is a junctional level. There is severe left and moderate right neural foraminal stenosis at L3-L4 and L5-S1. Evidence of high-grade spinal canal stenosis. IMPRESSION: Incomplete exam. Axial T1 weighted sequences were not acquired. 1. Findings of discitis osteomyelitis in the lower thoracic spine at the T10-T12 vertebral body levels, with severe spinal canal stenosis at these levels secondary to the presence of abnormal fluid within the ventral and dorsal epidural space, most likely epidural phlegmon. There is an abnormal T2 hyperintense collection in the ventral epidural space at the L1 vertebral body level, which is nonspecific, but inferior extension of infection is not excluded. These findinsg are unchanged from 05/01/22. 2. Abnormal STIR hyperintense signal in the L1-L2 and L2-L3 disc spaces could suggest early discitis. 3. Severe bilateral neural foraminal stenosis at L2-L3, which is a junctional level. Severe left and moderate right neural foraminal stenosis at L3-L4 and L5-S1. 4. 5.9 x 2.0 x 2.0 cm fluid collection spanning the L4-L5 interspinous space, at the site of prior laminectomy, which could reflect a postoperative seroma, although abscess is not  excluded. This is unchanged compared to 04/22/22 Electronically Signed   By: Lorenza Cambridge M.D.   On: 12/03/2022 12:41     Marcos Eke, NP [facility]  12/04/2022  9:52 AM

## 2022-12-04 NOTE — Progress Notes (Addendum)
PROGRESS NOTE    DYER HUIZAR  IPJ:[id number] DOB: [birth year] DOA: 12/03/2022 PCP: Junie Spencer, FNP    Brief Narrative:  John Ware is a 57 y.o. male with medical history significant of tricuspid valve endocarditis, thoracic and lumbar spine osteomyelitis/ discitis status post IV antibiotics on chronic suppressive antibiotic treatment, history of MSSA and Pseudomonas infection, chronic narcotic dependence, chronic pain presented to the hospital with worsening back pain for 1 week with no bowel or bladder incontinence.  Of note, patient was recently seen by infectious disease 2 weeks back and is on chronic Cipro and Doxy suppressive treatment.  In the ED, patient was afebrile.  Thoracic spine and lumbar spine MRI showed discitis osteomyelitis in the lower thoracic spine T10-12, early discitis L1-L3 5.9 x 2.0 x 2.0 cm fluid collection spanning L4-L5.  Neurosurgery and infectious disease was consulted from the ED and plan was admission to the hospital for further evaluation and treatment.   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  Imaging showing involvement of T10-12, L1-2 and L2-3 level. Neurosurgery and ID recommend IR guided aspiration and biopsy and holding off on antibiotic at this time.  Continue pain control.  Infectious disease has been consulted as well.  IR has been consulted for aspiration likely to be 12/05/2022.  Afebrile without leukocytosis.  CRP 3.2 and ESR of 19.  Blood cultures have been negative in less than 24 hours.  Thrombocytosis -Likely reactive from the discitis/osteomyelitis, we will continue to monitor.  Current platelet count of 429.   Chronic back pain and narcotic dependence Continue baclofen, Tylenol, gabapentin, MS Contin.  On as needed Dilaudid as well.  Complains of severe pain.  Continue adequate pain control.   DVT prophylaxis: enoxaparin  (LOVENOX) injection 40 mg Start: 12/03/22 1500   Code Status:     Code Status: Full Code  Disposition: Home likely in 3 to 4 days.  Status is: Inpatient  Remains inpatient appropriate because: IV antibiotic, need for aspiration, neurosurgery and ID follow-up   Family Communication: None at bedside  Consultants:  Neurosurgery Infectious disease  Procedures:  None yet  Antimicrobials:  None yet  Anti-infectives (From admission, onward)    None      Subjective: Today, patient was seen and examined at bedside.  Patient complains of back pain.  Denies any fever chills or rigor.  Objective: Vitals:   12/03/22 1355 12/03/22 1758 12/03/22 1853 12/03/22 2249  BP: (!) 144/98 (!) 152/98  (!) 154/109  Pulse: 96 (!) 123  100  Resp: 18     Temp: 98.2 F (36.8 C) 98.7 F (37.1 C)  98.9 F (37.2 C)  TempSrc: Oral Oral  Oral  SpO2: 100% 100%  100%  Weight:   83.1 kg   Height:   5\' 10"  (1.778 m)     Intake/Output Summary (Last 24 hours) at 12/04/2022 1034 Last data filed at 12/04/2022 0230 Gross per 24 hour  Intake 360 ml  Output 750 ml  Net -390 ml   Filed Weights   12/03/22 1853  Weight: 83.1 kg    Physical Examination: Body mass index is 26.29 kg/m.  General:  Average built, not in obvious distress HENT:   No scleral pallor or icterus noted. Oral mucosa is moist.  Chest:  Clear breath sounds.  Diminished breath sounds bilaterally. No crackles or wheezes.  Tenderness on the lumbar spine on palpation.   CVS: S1 &S2 heard.  No murmur.  Regular rate and rhythm. Abdomen: Soft, nontender, nondistended.  Bowel sounds are heard.   Extremities: No cyanosis, clubbing or edema.  Peripheral pulses are palpable. Psych: Alert, awake and oriented, normal mood CNS:  No cranial nerve deficits.  Moves all extremities. Skin: Warm and dry.  No rashes noted.  Data Reviewed:   CBC: Recent Labs  Lab 12/03/22 1047 12/04/22 0212  WBC 10.7* 10.1  NEUTROABS 8.4*  --   HGB 13.2  12.3*  HCT 40.4 37.1*  MCV 85.1 85.1  PLT 475* 429*    Basic Metabolic Panel: Recent Labs  Lab 12/03/22 1047  NA 140  K 3.9  CL 100  CO2 27  GLUCOSE 213*  BUN 7  CREATININE 0.72  CALCIUM 9.2    Liver Function Tests: Recent Labs  Lab 12/03/22 1047  AST 24  ALT 18  ALKPHOS 123  BILITOT 0.5  PROT 7.6  ALBUMIN 3.7     Radiology Studies: CT Angio Chest/Abd/Pel for Dissection W and/or W/WO  Result Date: 12/03/2022 CLINICAL DATA:  Mid back pain, concern for aneurysm or dissection EXAM: CT ANGIOGRAPHY CHEST, ABDOMEN AND PELVIS TECHNIQUE: Non-contrast CT of the chest was initially obtained. Multidetector CT imaging through the chest, abdomen and pelvis was performed using the standard protocol during bolus administration of intravenous contrast. Multiplanar reconstructed images and MIPs were obtained and reviewed to evaluate the vascular anatomy. RADIATION DOSE REDUCTION: This exam was performed according to the departmental dose-optimization program which includes automated exposure control, adjustment of the mA and/or kV according to patient size and/or use of iterative reconstruction technique. CONTRAST:  100mL OMNIPAQUE IOHEXOL 350 MG/ML SOLN COMPARISON:  None Available. FINDINGS: CTA CHEST FINDINGS VASCULAR Aorta: Satisfactory opacification of the aorta. Normal contour and caliber of the thoracic aorta. No evidence of aneurysm, dissection, or other acute aortic pathology. Minimal scattered aortic atherosclerosis. Cardiovascular: No evidence of pulmonary embolism on limited non-tailored examination. Normal heart size. No pericardial effusion. Review of the MIP images confirms the above findings. NON VASCULAR Mediastinum/Nodes: No enlarged mediastinal, hilar, or axillary lymph nodes. Thyroid gland, trachea, and esophagus demonstrate no significant findings. Lungs/Pleura: Lungs are clear. No pleural effusion or pneumothorax. Musculoskeletal: Extensive disc degenerative disease and  bridging osteophytosis throughout the thoracic and lumbar spine, in keeping with DISH. Paraspinous soft tissue thickening about T10 through T12 with extensive underlying bony sclerosis and erosion, particularly involving the T11-T12 disc space (series 7, image 116, series 11, image 112). Review of the MIP images confirms the above findings. CTA ABDOMEN AND PELVIS FINDINGS VASCULAR Mildly tortuous abdominal aorta. Otherwise normal contour and caliber of the abdominal aorta. No evidence of aneurysm, dissection, or other acute aortic pathology. Standard branching pattern of the abdominal aorta with solitary bilateral renal arteries. Review of the MIP images confirms the above findings. NON-VASCULAR Hepatobiliary: No solid liver abnormality is seen. No gallstones, gallbladder wall thickening, or biliary dilatation. Pancreas: Unremarkable. No pancreatic ductal dilatation or surrounding inflammatory changes. Spleen: Normal in size without significant abnormality. Adrenals/Urinary Tract: Adrenal glands are unremarkable. Kidneys are normal, without renal calculi, solid lesion, or hydronephrosis. Bladder is unremarkable. Stomach/Bowel: Stomach is within normal limits. Appendix appears normal. No evidence of bowel wall thickening, distention, or inflammatory changes. Lymphatic: No enlarged abdominal or pelvic lymph nodes. Reproductive: No mass or other significant abnormality. Other: No abdominal wall hernia or abnormality. No ascites. Musculoskeletal: No acute osseous findings. Posterior discectomy, laminectomy, and fusion of L3 through S1. IMPRESSION: 1. Normal contour and caliber of the thoracic and  abdominal aorta. No evidence of aneurysm, dissection, or other acute aortic pathology. Minimal scattered aortic atherosclerosis. 2. Paraspinous soft tissue thickening about T10 through T12 with extensive underlying bony sclerosis and erosion, particularly involving the T11-T12 disc space. Findings are consistent with  discitis/osteomyelitis and better assessed by same-day MR examination of the thoracic and lumbar spine. Aortic Atherosclerosis (ICD10-I70.0). Electronically Signed   By: Jearld Lesch M.D.   On: 12/03/2022 13:11   MR LUMBAR SPINE WO CONTRAST  Result Date: 12/03/2022 CLINICAL DATA:  Concern for osteomyelitis. EXAM: MRI LUMBAR SPINE WITHOUT CONTRAST TECHNIQUE: Multiplanar, multisequence MR imaging of the lumbar spine was performed. No intravenous contrast was administered. COMPARISON:  MRI T Spine 05/01/22, MRI L Spine 04/22/22 FINDINGS: Limitations: Assessment is somewhat limited due to incomplete exam. Axial T1 weighted sequences were not acquired. Segmentation: There is lumbarization of S1 with a small disc space at S1-S2. Alignment:  Grade 1 anterolisthesis of L5 on S1. Vertebrae: There are postsurgical changes from L3-S1 posterior spinal fusion. Conus medullaris and cauda equina: Conus extends to the L1 level. Conus and cauda equina appear normal. Paraspinal and other soft tissues: There is abnormal T2/stir hyperintense signal surrounding the T11 and T12 vertebral bodies (series 5, image 3, 19). There is a 5.9 x 2.0 x 2.0 cm fluid collection spanning the L4-L5 interspinous space, at the site of prior laminectomy. Abnormal STIR hyperintense signal is also present in the L2-L3 disc space (series 5, image 9), which could suggest early discitis at this level, as well as at L1-L2. Disc levels: Redemonstrated are findings of discitis osteomyelitis in the lower thoracic spine at the T10-T12 vertebral body levels. There is abnormal T2/stir hyperintense signal at these levels, compatible with discitis. There is irregular endplate erosion, compatible with osteomyelitis. There is severe spinal canal stenosis at these levels secondary to the presence of abnormal fluid within the ventral and dorsal epidural space (series 8, image 6), most likely epidural phlegmon. There is an abnormal T2 hyperintense collection in the ventral  epidural space at the L1 vertebral body level (series 8, image 15), which is nonspecific, but infection is not excluded. There is severe bilateral neural foraminal stenosis at L2-L3, which is a junctional level. There is severe left and moderate right neural foraminal stenosis at L3-L4 and L5-S1. Evidence of high-grade spinal canal stenosis. IMPRESSION: Incomplete exam. Axial T1 weighted sequences were not acquired. 1. Findings of discitis osteomyelitis in the lower thoracic spine at the T10-T12 vertebral body levels, with severe spinal canal stenosis at these levels secondary to the presence of abnormal fluid within the ventral and dorsal epidural space, most likely epidural phlegmon. There is an abnormal T2 hyperintense collection in the ventral epidural space at the L1 vertebral body level, which is nonspecific, but inferior extension of infection is not excluded. These findinsg are unchanged from 05/01/22. 2. Abnormal STIR hyperintense signal in the L1-L2 and L2-L3 disc spaces could suggest early discitis. 3. Severe bilateral neural foraminal stenosis at L2-L3, which is a junctional level. Severe left and moderate right neural foraminal stenosis at L3-L4 and L5-S1. 4. 5.9 x 2.0 x 2.0 cm fluid collection spanning the L4-L5 interspinous space, at the site of prior laminectomy, which could reflect a postoperative seroma, although abscess is not excluded. This is unchanged compared to 04/22/22 Electronically Signed   By: Lorenza Cambridge M.D.   On: 12/03/2022 12:41      LOS: 1 day   Joycelyn Das, MD [facility] Available via Epic secure chat 7am-7pm After these  hours, please refer to coverage provider listed on [email or web address] 12/04/2022, 10:34 AM

## 2022-12-05 ENCOUNTER — Inpatient Hospital Stay (HOSPITAL_COMMUNITY): Payer: Commercial Managed Care - PPO

## 2022-12-05 DIAGNOSIS — M8668 Other chronic osteomyelitis, other site: Secondary | ICD-10-CM | POA: Diagnosis not present

## 2022-12-05 HISTORY — PX: IR FLUORO GUIDED NEEDLE PLC ASPIRATION/INJECTION LOC: IMG2395

## 2022-12-05 LAB — BASIC METABOLIC PANEL
Anion gap: 11 (ref 5–15)
BUN: 5 mg/dL — ABNORMAL LOW (ref 6–20)
CO2: 30 mmol/L (ref 22–32)
Calcium: 9.2 mg/dL (ref 8.9–10.3)
Chloride: 93 mmol/L — ABNORMAL LOW (ref 98–111)
Creatinine, Ser: 0.61 mg/dL (ref 0.61–1.24)
GFR, Estimated: >60 mL/min (ref >60)
Glucose, Bld: 166 mg/dL — ABNORMAL HIGH (ref 70–99)
Potassium: 3.7 mmol/L (ref 3.5–5.1)
Sodium: 134 mmol/L — ABNORMAL LOW (ref 135–145)

## 2022-12-05 LAB — CBC
HCT: 37.2 % — ABNORMAL LOW (ref 39.0–52.0)
Hemoglobin: 12.9 g/dL — ABNORMAL LOW (ref 13.0–17.0)
MCH: 28.9 pg (ref 26.0–34.0)
MCHC: 34.7 g/dL (ref 30.0–36.0)
MCV: 83.4 fL (ref 80.0–100.0)
Platelets: 414 10*3/uL — ABNORMAL HIGH (ref 150–400)
RBC: 4.46 MIL/uL (ref 4.22–5.81)
RDW: 14.2 % (ref 11.5–15.5)
WBC: 8.2 10*3/uL (ref 4.0–10.5)
nRBC: 0 % (ref 0.0–0.2)

## 2022-12-05 LAB — MAGNESIUM: Magnesium: 1.6 mg/dL — ABNORMAL LOW (ref 1.7–2.4)

## 2022-12-05 LAB — CULTURE, BLOOD (ROUTINE X 2)

## 2022-12-05 LAB — PROTIME-INR
INR: 1.1 (ref 0.8–1.2)
Prothrombin Time: 14.6 seconds (ref 11.4–15.2)

## 2022-12-05 MED ORDER — HYDROMORPHONE HCL 1 MG/ML IJ SOLN
INTRAMUSCULAR | Status: AC
  Filled 2022-12-05: qty 1

## 2022-12-05 MED ORDER — MIDAZOLAM HCL 2 MG/2ML IJ SOLN
INTRAMUSCULAR | Status: AC
  Filled 2022-12-05: qty 2

## 2022-12-05 MED ORDER — FENTANYL CITRATE (PF) 100 MCG/2ML IJ SOLN
INTRAMUSCULAR | Status: AC | PRN
  Administered 2022-12-05 (×2): 25 ug via INTRAVENOUS

## 2022-12-05 MED ORDER — MIDAZOLAM HCL 2 MG/2ML IJ SOLN
INTRAMUSCULAR | Status: AC | PRN
  Administered 2022-12-05 (×2): 1 mg via INTRAVENOUS

## 2022-12-05 MED ORDER — HYDROMORPHONE HCL 1 MG/ML IJ SOLN
INTRAMUSCULAR | Status: AC | PRN
  Administered 2022-12-05: 1 mg via INTRAVENOUS

## 2022-12-05 MED ORDER — BUPIVACAINE HCL (PF) 0.25 % IJ SOLN
INTRAMUSCULAR | Status: AC
  Filled 2022-12-05: qty 30

## 2022-12-05 MED ORDER — SODIUM CHLORIDE 0.9 % IV SOLN
2.0000 g | Freq: Three times a day (TID) | INTRAVENOUS | Status: DC
  Administered 2022-12-05 – 2022-12-09 (×12): 2 g via INTRAVENOUS
  Filled 2022-12-05 (×12): qty 12.5

## 2022-12-05 MED ORDER — LORAZEPAM 2 MG/ML IJ SOLN
0.5000 mg | Freq: Once | INTRAMUSCULAR | Status: DC | PRN
  Filled 2022-12-05: qty 1

## 2022-12-05 MED ORDER — SODIUM CHLORIDE 0.9 % IV SOLN
8.0000 mg/kg | Freq: Every day | INTRAVENOUS | Status: DC
  Administered 2022-12-05 – 2022-12-08 (×4): 650 mg via INTRAVENOUS
  Filled 2022-12-05 (×5): qty 13

## 2022-12-05 MED ORDER — FENTANYL CITRATE (PF) 100 MCG/2ML IJ SOLN
INTRAMUSCULAR | Status: AC
  Filled 2022-12-05: qty 2

## 2022-12-05 MED ORDER — MAGNESIUM OXIDE -MG SUPPLEMENT 400 (240 MG) MG PO TABS
400.0000 mg | ORAL_TABLET | Freq: Two times a day (BID) | ORAL | Status: AC
  Administered 2022-12-05 – 2022-12-07 (×6): 400 mg via ORAL
  Filled 2022-12-05 (×6): qty 1

## 2022-12-05 NOTE — Progress Notes (Signed)
PROGRESS NOTE John Ware  ZOX:[id number] DOB: Jan 01, 1966 DOA: 12/03/2022 PCP: Junie Spencer, FNP  Brief Narrative/Hospital Course: 57 y.o.m w/ hx of TV endocarditis, thoracic and lumbar spine osteomyelitis/ discitis status post IV antibiotics on chronic suppressive antibiotic treatment, history of MSSA and Pseudomonas infection, chronic narcotic dependence, chronic pain presented  w/  worsening back pain x 1 week. Was recently seen by infectious disease 2 weeks back and is on chronic Cipro and Doxy suppressive treatment.   In WJ:XBJYNWGN,FAOZHYQM spine and lumbar spine MRI showed discitis osteomyelitis in the lower thoracic spine T10-12, early discitis L1-L3 5.9 x 2.0 x 2.0 cm fluid collection spanning L4-L5.Neurosurgery and ID- consulted and admitted.     Subjective: Seen and examined Complains of ongoing back pain Patient afebrile, BP stable  Assessment and Plan: Principal Problem:   Osteomyelitis Active Problems:   Osteomyelitis of thoracic region   Intractable back pain   Medication management   Vertebral osteomyelitis   Worsening back pain concern for persistent/recurrent infection and possible phlegmon  Patient chronically on Cipro and doxycycline.MRI showed discitis osteomyelitis in the lower thoracic spine T10-12, early discitis L1-L3 5.9 x 2.0 x 2.0 cm fluid collection spanning L4-L5.  ID following closely, neurosurgeon has seen the patient no plan for NO operative intervention and IR consulted for tissue sampling aspiration done this am  Thrombocytosis:Likely reactive from the discitis/osteomyelitis, monitor   Chronic back pain and narcotic dependence:Continue baclofen, Tylenol, gabapentin, MS Contin prn Dilaudid  Hypomagnesemia- replete  DVT prophylaxis: enoxaparin (LOVENOX) injection 40 mg Start: 12/03/22 1500 Code Status:   Code Status: Full Code Family Communication: plan of care discussed with patien at bedside. Patient status is: Inpatient because of concern  for back infection Level of care: Med-Surg   Dispo: The patient is from: Home            Anticipated disposition: Home 2 -3 days Objective: Vitals last 24 hrs: Vitals:   12/05/22 1045 12/05/22 1050 12/05/22 1055 12/05/22 1100  BP: (!) 164/111 (!) 166/112 (!) 158/122   Pulse: 74 71 78 70  Resp: 19 20 18 20   Temp:      TempSrc:      SpO2: 100% 100% 100% 100%  Weight:      Height:       Weight change:   Physical Examination: General exam: alert awake, older than stated age HEENT:Oral mucosa moist, Ear/Nose WNL grossly Respiratory system: bilaterally clear BS, no use of accessory muscle Cardiovascular system: S1 & S2 +, No JVD. Gastrointestinal system: Abdomen soft,NT,ND, BS+ Nervous System:Alert, awake, moving extremities. Extremities: LE edema neg,distal peripheral pulses palpable.  Skin: No rashes,no icterus. MSK: Normal muscle bulk,tone, power  Medications reviewed:  Scheduled Meds:  baclofen  5 mg Oral TID   DULoxetine  60 mg Oral QHS   enoxaparin (LOVENOX) injection  40 mg Subcutaneous Q24H   gabapentin  400 mg Oral BID   morphine  15 mg Oral BID   mupirocin ointment  1 Application Nasal BID  Continuous Infusions:   Diet Order             Diet NPO time specified  Diet effective midnight                   Intake/Output Summary (Last 24 hours) at 12/05/2022 1108 Last data filed at 12/05/2022 0725 Gross per 24 hour  Intake 600 ml  Output 600 ml  Net 0 ml   Net IO Since Admission: -150 mL [12/05/22 1108]  Wt Readings from Last 3 Encounters:  12/03/22 83.1 kg  11/20/22 83.1 kg  09/25/22 76.2 kg    Unresulted Labs (From admission, onward)     Start     Ordered   12/03/22 1647  Surgical PCR screen  Once,   R        12/03/22 1646          Data Reviewed: I have personally reviewed following labs and imaging studies CBC: Recent Labs  Lab 12/03/22 1047 12/04/22 0212 12/05/22 0159  WBC 10.7* 10.1 8.2  NEUTROABS 8.4*  --   --   HGB 13.2 12.3*  12.9*  HCT 40.4 37.1* 37.2*  MCV 85.1 85.1 83.4  PLT 475* 429* 414*   Basic Metabolic Panel: Recent Labs  Lab 12/03/22 1047 12/05/22 0159  NA 140 134*  K 3.9 3.7  CL 100 93*  CO2 27 30  GLUCOSE 213* 166*  BUN 7 5*  CREATININE 0.72 0.61  CALCIUM 9.2 9.2  MG  --  1.6*   GFR: Estimated Creatinine Clearance: 107.7 mL/min (by C-G formula based on SCr of 0.61 mg/dL). Liver Function Tests: Recent Labs  Lab 12/03/22 1047  AST 24  ALT 18  ALKPHOS 123  BILITOT 0.5  PROT 7.6  ALBUMIN 3.7   Recent Labs  Lab 12/03/22 1047  LIPASE 99*  No results for input(s): "AMMONIA" in the last 168 hours. Coagulation Profile: Recent Labs  Lab 12/05/22 0159  INR 1.1   Recent Results (from the past 240 hour(s))  Blood culture (routine x 2)     Status: None (Preliminary result)   Collection Time: 12/03/22 10:41 AM   Specimen: BLOOD LEFT ARM  Result Value Ref Range Status   Specimen Description BLOOD LEFT ARM  Final   Special Requests   Final    BOTTLES DRAWN AEROBIC AND ANAEROBIC Blood Culture adequate volume   Culture   Final    NO GROWTH 2 DAYS Performed at [facility], [address], [place], [place] [address]    Report Status PENDING  Incomplete  Blood culture (routine x 2)     Status: None (Preliminary result)   Collection Time: 12/03/22 10:47 AM   Specimen: BLOOD RIGHT ARM  Result Value Ref Range Status   Specimen Description BLOOD RIGHT ARM  Final   Special Requests   Final    BOTTLES DRAWN AEROBIC AND ANAEROBIC Blood Culture adequate volume   Culture   Final    NO GROWTH 2 DAYS Performed at [facility], [address] [place], [place] [address]    Report Status PENDING  Incomplete    Antimicrobials: Anti-infectives (From admission, onward)    None      Culture/Microbiology    Component Value Date/Time   SDES BLOOD RIGHT ARM 12/03/2022 1047   SPECREQUEST  12/03/2022 1047    BOTTLES DRAWN AEROBIC AND ANAEROBIC Blood Culture adequate volume    CULT  12/03/2022 1047    NO GROWTH 2 DAYS Performed at [facility], [address] [place], [place] [address]    REPTSTATUS PENDING 12/03/2022 1047  Radiology Studies: CT Angio Chest/Abd/Pel for Dissection W and/or W/WO  Result Date: 12/03/2022 CLINICAL DATA:  Mid back pain, concern for aneurysm or dissection EXAM: CT ANGIOGRAPHY CHEST, ABDOMEN AND PELVIS TECHNIQUE: Non-contrast CT of the chest was initially obtained. Multidetector CT imaging through the chest, abdomen and pelvis was performed using the standard protocol during bolus administration of intravenous contrast. Multiplanar reconstructed  images and MIPs were obtained and reviewed to evaluate the vascular anatomy. RADIATION DOSE REDUCTION: This exam was performed according to the departmental dose-optimization program which includes automated exposure control, adjustment of the mA and/or kV according to patient size and/or use of iterative reconstruction technique. CONTRAST:  OMNIPAQUE IOHEXOL 350 MG/ML SOLN COMPARISON:  None Available. FINDINGS: CTA CHEST FINDINGS VASCULAR Aorta: Satisfactory opacification of the aorta. Normal contour and caliber of the thoracic aorta. No evidence of aneurysm, dissection, or other acute aortic pathology. Minimal scattered aortic atherosclerosis. Cardiovascular: No evidence of pulmonary embolism on limited non-tailored examination. Normal heart size. No pericardial effusion. Review of the MIP images confirms the above findings. NON VASCULAR Mediastinum/Nodes: No enlarged mediastinal, hilar, or axillary lymph nodes. Thyroid gland, trachea, and esophagus demonstrate no significant findings. Lungs/Pleura: Lungs are clear. No pleural effusion or pneumothorax. Musculoskeletal: Extensive disc degenerative disease and bridging osteophytosis throughout the thoracic and lumbar spine, in keeping with DISH. Paraspinous soft tissue thickening about T10 through T12 with extensive underlying bony sclerosis and  erosion, particularly involving the T11-T12 disc space (series 7, image 116, series 11, image 112). Review of the MIP images confirms the above findings. CTA ABDOMEN AND PELVIS FINDINGS VASCULAR Mildly tortuous abdominal aorta. Otherwise normal contour and caliber of the abdominal aorta. No evidence of aneurysm, dissection, or other acute aortic pathology. Standard branching pattern of the abdominal aorta with solitary bilateral renal arteries. Review of the MIP images confirms the above findings. NON-VASCULAR Hepatobiliary: No solid liver abnormality is seen. No gallstones, gallbladder wall thickening, or biliary dilatation. Pancreas: Unremarkable. No pancreatic ductal dilatation or surrounding inflammatory changes. Spleen: Normal in size without significant abnormality. Adrenals/Urinary Tract: Adrenal glands are unremarkable. Kidneys are normal, without renal calculi, solid lesion, or hydronephrosis. Bladder is unremarkable. Stomach/Bowel: Stomach is within normal limits. Appendix appears normal. No evidence of bowel wall thickening, distention, or inflammatory changes. Lymphatic: No enlarged abdominal or pelvic lymph nodes. Reproductive: No mass or other significant abnormality. Other: No abdominal wall hernia or abnormality. No ascites. Musculoskeletal: No acute osseous findings. Posterior discectomy, laminectomy, and fusion of L3 through S1. IMPRESSION: 1. Normal contour and caliber of the thoracic and abdominal aorta. No evidence of aneurysm, dissection, or other acute aortic pathology. Minimal scattered aortic atherosclerosis. 2. Paraspinous soft tissue thickening about T10 through T12 with extensive underlying bony sclerosis and erosion, particularly involving the T11-T12 disc space. Findings are consistent with discitis/osteomyelitis and better assessed by same-day MR examination of the thoracic and lumbar spine. Aortic Atherosclerosis (ICD10-I70.0). Electronically Signed   By: Jearld Lesch M.D.   On:  12/03/2022 13:11   MR LUMBAR SPINE WO CONTRAST  Result Date: 12/03/2022 CLINICAL DATA:  Concern for osteomyelitis. EXAM: MRI LUMBAR SPINE WITHOUT CONTRAST TECHNIQUE: Multiplanar, multisequence MR imaging of the lumbar spine was performed. No intravenous contrast was administered. COMPARISON:  MRI T Spine 05/01/22, MRI L Spine 04/22/22 FINDINGS: Limitations: Assessment is somewhat limited due to incomplete exam. Axial T1 weighted sequences were not acquired. Segmentation: There is lumbarization of S1 with a small disc space at S1-S2. Alignment:  Grade 1 anterolisthesis of L5 on S1. Vertebrae: There are postsurgical changes from L3-S1 posterior spinal fusion. Conus medullaris and cauda equina: Conus extends to the L1 level. Conus and cauda equina appear normal. Paraspinal and other soft tissues: There is abnormal T2/stir hyperintense signal surrounding the T11 and T12 vertebral bodies (series 5, image 3, 19). There is a 5.9 x 2.0 x 2.0 cm fluid collection spanning the L4-L5 interspinous space, at  the site of prior laminectomy. Abnormal STIR hyperintense signal is also present in the L2-L3 disc space (series 5, image 9), which could suggest early discitis at this level, as well as at L1-L2. Disc levels: Redemonstrated are findings of discitis osteomyelitis in the lower thoracic spine at the T10-T12 vertebral body levels. There is abnormal T2/stir hyperintense signal at these levels, compatible with discitis. There is irregular endplate erosion, compatible with osteomyelitis. There is severe spinal canal stenosis at these levels secondary to the presence of abnormal fluid within the ventral and dorsal epidural space (series 8, image 6), most likely epidural phlegmon. There is an abnormal T2 hyperintense collection in the ventral epidural space at the L1 vertebral body level (series 8, image 15), which is nonspecific, but infection is not excluded. There is severe bilateral neural foraminal stenosis at L2-L3, which is a  junctional level. There is severe left and moderate right neural foraminal stenosis at L3-L4 and L5-S1. Evidence of high-grade spinal canal stenosis. IMPRESSION: Incomplete exam. Axial T1 weighted sequences were not acquired. 1. Findings of discitis osteomyelitis in the lower thoracic spine at the T10-T12 vertebral body levels, with severe spinal canal stenosis at these levels secondary to the presence of abnormal fluid within the ventral and dorsal epidural space, most likely epidural phlegmon. There is an abnormal T2 hyperintense collection in the ventral epidural space at the L1 vertebral body level, which is nonspecific, but inferior extension of infection is not excluded. These findinsg are unchanged from 05/01/22. 2. Abnormal STIR hyperintense signal in the L1-L2 and L2-L3 disc spaces could suggest early discitis. 3. Severe bilateral neural foraminal stenosis at L2-L3, which is a junctional level. Severe left and moderate right neural foraminal stenosis at L3-L4 and L5-S1. 4. 5.9 x 2.0 x 2.0 cm fluid collection spanning the L4-L5 interspinous space, at the site of prior laminectomy, which could reflect a postoperative seroma, although abscess is not excluded. This is unchanged compared to 04/22/22 Electronically Signed   By: Lorenza CambridgeHemant  Desai M.D.   On: 12/03/2022 12:41     LOS: 2 days   Lanae Boastamesh Woodfin Kiss, MD [facility]  12/05/2022, 11:08 AM

## 2022-12-05 NOTE — Progress Notes (Signed)
Pharmacy Antibiotic Note  John Ware is a 57 y.o. male admitted on 12/03/2022 with concern for discitis/osteo. History of prior infections with Psuedomonas, MSSA/MRSA. Pharmacy has been consulted for Cefepime and Daptomycin dosing.  S/p IR asp today - culture pending. SCr 0.62, CrCl>60 ml/min  Plan: - Start Daptomycin 650 mg (8 mg/kg) every 24 hours - Start Cefepime 2g IV every 8 hours - Will continue to follow renal function, culture results, LOT, and antibiotic de-escalation plans   Height: 5\' 10"  (177.8 cm) Weight: 83.1 kg (183 lb 3.2 oz) IBW/kg (Calculated) : 73  Temp (24hrs), Avg:98.1 F (36.7 C), Min:97.8 F (36.6 C), Max:98.5 F (36.9 C)  Recent Labs  Lab 12/03/22 1047 12/04/22 0212 12/05/22 0159  WBC 10.7* 10.1 8.2  CREATININE 0.72  --  0.61    Estimated Creatinine Clearance: 107.7 mL/min (by C-G formula based on SCr of 0.61 mg/dL).    Allergies  Allergen Reactions   Tizanidine Other (See Comments)    Leg cramps    Antimicrobials this admission: PTA Cipro/Doxy for suppression - last dose unk Daptomycin 4/11 >> Cefepime 4/11 >>  Dose adjustments this admission:   Microbiology results: 4/9 BCx >> ngx2d 4/11 lumbar IR asp >>  Thank you for allowing pharmacy to be a part of this patient's care.  Georgina Pillion, PharmD, BCPS Infectious Diseases Clinical Pharmacist 12/05/2022 2:22 PM   **Pharmacist phone directory can now be found on [email or web address] ([email or web address]).  Listed under [facility].

## 2022-12-05 NOTE — Progress Notes (Signed)
ID Brief Note   S/p t12/L1 disc aspiration by IR today, aerobic and anaerobic cx has been sent Will start daptomycin + cefepime pending OR cx Monitor CPK, CBC and BMP D/w ID pharm D  Odette Fraction, MD Infectious Disease Physician [facility] [address] [place], [place] [address] Phone: [phone]  Fax: [phone]

## 2022-12-05 NOTE — Plan of Care (Signed)

## 2022-12-05 NOTE — Procedures (Signed)
INR.  Status post T12/L1 disc aspiration under fluoroscopic guidance.  Blood-tinged particles obtained in the aspirate .  Sent for microbiologic analysis.  Patient tolerated the procedure well.  There were no acute complications.  Fatima Sanger MD.

## 2022-12-06 ENCOUNTER — Other Ambulatory Visit: Payer: Self-pay

## 2022-12-06 DIAGNOSIS — M4644 Discitis, unspecified, thoracic region: Secondary | ICD-10-CM

## 2022-12-06 DIAGNOSIS — Z79899 Other long term (current) drug therapy: Secondary | ICD-10-CM | POA: Diagnosis not present

## 2022-12-06 DIAGNOSIS — M8668 Other chronic osteomyelitis, other site: Secondary | ICD-10-CM | POA: Diagnosis not present

## 2022-12-06 DIAGNOSIS — M4624 Osteomyelitis of vertebra, thoracic region: Secondary | ICD-10-CM | POA: Diagnosis not present

## 2022-12-06 LAB — CBC
HCT: 39.2 % (ref 39.0–52.0)
Hemoglobin: 13.4 g/dL (ref 13.0–17.0)
MCH: 28.5 pg (ref 26.0–34.0)
MCHC: 34.2 g/dL (ref 30.0–36.0)
MCV: 83.2 fL (ref 80.0–100.0)
Platelets: 422 10*3/uL — ABNORMAL HIGH (ref 150–400)
RBC: 4.71 MIL/uL (ref 4.22–5.81)
RDW: 14 % (ref 11.5–15.5)
WBC: 9.9 10*3/uL (ref 4.0–10.5)
nRBC: 0 % (ref 0.0–0.2)

## 2022-12-06 LAB — BASIC METABOLIC PANEL
Anion gap: 10 (ref 5–15)
BUN: 6 mg/dL (ref 6–20)
CO2: 31 mmol/L (ref 22–32)
Calcium: 9.1 mg/dL (ref 8.9–10.3)
Chloride: 94 mmol/L — ABNORMAL LOW (ref 98–111)
Creatinine, Ser: 0.64 mg/dL (ref 0.61–1.24)
GFR, Estimated: >60 mL/min (ref >60)
Glucose, Bld: 202 mg/dL — ABNORMAL HIGH (ref 70–99)
Potassium: 3.5 mmol/L (ref 3.5–5.1)
Sodium: 135 mmol/L (ref 135–145)

## 2022-12-06 LAB — SURGICAL PCR SCREEN
MRSA, PCR: NEGATIVE
Staphylococcus aureus: NEGATIVE

## 2022-12-06 LAB — CK: Total CK: 77 U/L (ref 49–397)

## 2022-12-06 LAB — CULTURE, BLOOD (ROUTINE X 2): Special Requests: ADEQUATE

## 2022-12-06 LAB — AEROBIC/ANAEROBIC CULTURE W GRAM STAIN (SURGICAL/DEEP WOUND)

## 2022-12-06 MED ORDER — SODIUM CHLORIDE 0.9% FLUSH: 10.0000 mL | INTRAVENOUS | Status: DC | PRN

## 2022-12-06 MED ORDER — CHLORHEXIDINE GLUCONATE CLOTH 2 % EX PADS
6.0000 | MEDICATED_PAD | Freq: Every day | CUTANEOUS | Status: DC
  Administered 2022-12-06 – 2022-12-09 (×4): 6 via TOPICAL

## 2022-12-06 NOTE — Progress Notes (Signed)
Pharmacy Antibiotic Note  John Ware is a 57 y.o. male admitted on 12/03/2022 with concern for discitis/osteo. History of prior infections with Psuedomonas, MSSA/MRSA. Pharmacy has been consulted for Cefepime and Daptomycin dosing.  S/p IR asp today - culture pending. Renal function remains stable, afebrile, WBC WNL.  Baseline CK WNL.  Plan: Continue 650mg  IV Q24H Cefepime 2g IV Q8H CK qFri Pharmacy will sign off with stable renal fxn, will follow peripherally.  Thank you for the consult!  Height: 5\' 10"  (177.8 cm) Weight: 83.1 kg (183 lb 3.2 oz) IBW/kg (Calculated) : 73  Temp (24hrs), Avg:98.2 F (36.8 C), Min:98 F (36.7 C), Max:98.6 F (37 C)  Recent Labs  Lab 12/03/22 1047 12/04/22 0212 12/05/22 0159 12/06/22 0633  WBC 10.7* 10.1 8.2 9.9  CREATININE 0.72  --  0.61 0.64     Estimated Creatinine Clearance: 107.7 mL/min (by C-G formula based on SCr of 0.64 mg/dL).    Allergies  Allergen Reactions   Tizanidine Other (See Comments)    Leg cramps    PTA Cipro/Doxy for suppression - last dose unk Cubicin 4/11 >> Cefepime 4/11 >>  4/12 CK = 77  4/9 BCx - NGTD 4/11 disc aspiration cx -   Vernadine Coombs D. Laney Potash, PharmD, BCPS, BCCCP 12/06/2022, 8:18 AM

## 2022-12-06 NOTE — Progress Notes (Signed)
Peripherally Inserted Central Catheter Placement  The IV Nurse has discussed with the patient and/or persons authorized to consent for the patient, the purpose of this procedure and the potential benefits and risks involved with this procedure.  The benefits include less needle sticks, lab draws from the catheter, and the patient may be discharged home with the catheter. Risks include, but not limited to, infection, bleeding, blood clot (thrombus formation), and puncture of an artery; nerve damage and irregular heartbeat and possibility to perform a PICC exchange if needed/ordered by physician.  Alternatives to this procedure were also discussed.  Bard Power PICC patient education guide, fact sheet on infection prevention and patient information card has been provided to patient /or left at bedside.    PICC Placement Documentation  PICC Single Lumen 12/06/22 Right Basilic 38 cm 0 cm (Active)  Indication for Insertion or Continuance of Line Prolonged intravenous therapies 12/06/22 1445  Exposed Catheter (cm) 0 cm 12/06/22 1445  Site Assessment Clean, Dry, Intact 12/06/22 1445  Line Status Flushed;Saline locked;Blood return noted 12/06/22 1445  Dressing Type Transparent;Securing device 12/06/22 1445  Dressing Status Antimicrobial disc in place 12/06/22 1445  Safety Lock Intact 12/06/22 1445  Line Adjustment (NICU/IV Team Only) No 12/06/22 1445  Dressing Intervention New dressing;Other (Comment) 12/06/22 1445  Dressing Change Due 12/13/22 12/06/22 1445       Reginia Forts Albarece 12/06/2022, 2:47 PM

## 2022-12-06 NOTE — Progress Notes (Signed)
PROGRESS NOTE MY SIDEL  FVC:[id number] DOB: 1966-04-26 DOA: 12/03/2022 PCP: Junie Spencer, FNP  Brief Narrative/Hospital Course: 57 y.o.m w/ hx of TV endocarditis, thoracic and lumbar spine osteomyelitis/ discitis status post IV antibiotics on chronic suppressive antibiotic treatment, history of MSSA and Pseudomonas infection, chronic narcotic dependence, chronic pain presented  w/  worsening back pain x 1 week. Was recently seen by infectious disease 2 weeks back and is on chronic Cipro and Doxy suppressive treatment.   In [id number] spine and lumbar spine MRI showed discitis osteomyelitis in the lower thoracic spine T10-12, early discitis L1-L3 5.9 x 2.0 x 2.0 cm fluid collection spanning L4-L5.Neurosurgery and ID- consulted and admitted.  Underwent T12/L1 disc aspiration by IR 4/11> daptomycin and cefepime started pending culture   Subjective: Seen and examined Complains of ongoing back pain Patient afebrile, BP stable Denies focal weakness bowel bladder incontinence  Assessment and Plan: Principal Problem:   Osteomyelitis Active Problems:   Osteomyelitis of thoracic region   Intractable back pain   Medication management   Vertebral osteomyelitis   Worsening back pain concern for persistent/recurrent infection and possible phlegmon  Patient chronically on Cipro and doxycycline.MRI showed discitis osteomyelitis in the lower thoracic spine T10-12, early discitis L1-L3 5.9 x 2.0 x 2.0 cm fluid collection spanning L4-L5.  ID following closely, neurosurgeon has seen the patient no plan for operative intervention and Underwent T12/L1 disc aspiration by IR 4/11> daptomycin and cefepime started pending culture.  Refused upper thoracic spine MRI despite multiple attempts and Ativan dose and has been canceled likely low yield, discussed with ID.  Await further culture report, plan is for long-term IV antibiotics with PICC line   Thrombocytosis:Likely reactive from the  discitis/osteomyelitis, continue to monitor   Chronic back pain and narcotic dependence: Pain managed with baclofen, Tylenol, gabapentin, MS Contin prn Dilaudid, and stable.  Hypomagnesemia-repleting w/ po mag ox  DVT prophylaxis: enoxaparin (LOVENOX) injection 40 mg Start: 12/03/22 1500 Code Status:   Code Status: Full Code Family Communication: plan of care discussed with patien at bedside. Patient status is: Inpatient because of concern for back infection Level of care: Med-Surg   Dispo: The patient is from: Home            Anticipated disposition: Home  in 1-2 days once culture finalized and has a PICC line  Objective: Vitals last 24 hrs: Vitals:   12/05/22 1502 12/05/22 1930 12/06/22 0421 12/06/22 0717  BP: (!) 165/110 (!) 129/94 (!) 141/98 (!) 127/92  Pulse: 89 98 83 86  Resp: 18 18 18 18   Temp: 98 F (36.7 C) 98.3 F (36.8 C) 98.6 F (37 C) 98 F (36.7 C)  TempSrc: Oral Oral  Oral  SpO2: 100% 96% 96% 99%  Weight:      Height:       Weight change:   Physical Examination: General exam: alert awake, oriented x 3, pleasant, older than stated age HEENT:Oral mucosa moist, Ear/Nose WNL grossly Respiratory system: Bilaterally clear BS, no use of accessory muscle Cardiovascular system: S1 & S2 +, No JVD. Gastrointestinal system: Abdomen soft,NT,ND, BS+ Nervous System: Alert, awake, moving extremities, he follows commands. Extremities: LE edema neg,distal peripheral pulses palpable.  Skin: No rashes,no icterus. MSK: Normal muscle bulk,tone, power  Medications reviewed:  Scheduled Meds:  baclofen  5 mg Oral TID   DULoxetine  60 mg Oral QHS   enoxaparin (LOVENOX) injection  40 mg Subcutaneous Q24H   gabapentin  400 mg Oral BID   magnesium oxide  400 mg Oral BID   morphine  15 mg Oral BID   mupirocin ointment  1 Application Nasal BID  Continuous Infusions:  ceFEPime (MAXIPIME) IV 2 g (12/06/22 0804)   DAPTOmycin (CUBICIN) 650 mg in sodium chloride 0.9 % IVPB Stopped  (12/05/22 1704)     Diet Order             Diet regular Room service appropriate? Yes; Fluid consistency: Thin  Diet effective now                   Intake/Output Summary (Last 24 hours) at 12/06/2022 1027 Last data filed at 12/06/2022 0849 Gross per 24 hour  Intake 540.06 ml  Output 400 ml  Net 140.06 ml    Net IO Since Admission: -9.94 mL [12/06/22 1027]  Wt Readings from Last 3 Encounters:  12/03/22 83.1 kg  11/20/22 83.1 kg  09/25/22 76.2 kg    Unresulted Labs (From admission, onward)     Start     Ordered   12/06/22 0500  Basic metabolic panel  Daily,   R     Question:  Specimen collection method  Answer:  Lab=Lab collect   12/05/22 1112   12/06/22 0500  CBC  Daily,   R     Question:  Specimen collection method  Answer:  Lab=Lab collect   12/05/22 1112   12/06/22 0500  CK  Every Friday,   R     Question:  Specimen collection method  Answer:  Lab=Lab collect   12/05/22 1423   12/03/22 1647  Surgical PCR screen  Once,   R        12/03/22 1646          Data Reviewed: I have personally reviewed following labs and imaging studies CBC: Recent Labs  Lab 12/03/22 1047 12/04/22 0212 12/05/22 0159 12/06/22 0633  WBC 10.7* 10.1 8.2 9.9  NEUTROABS 8.4*  --   --   --   HGB 13.2 12.3* 12.9* 13.4  HCT 40.4 37.1* 37.2* 39.2  MCV 85.1 85.1 83.4 83.2  PLT 475* 429* 414* 422*    Basic Metabolic Panel: Recent Labs  Lab 12/03/22 1047 12/05/22 0159 12/06/22 0633  NA 140 134* 135  K 3.9 3.7 3.5  CL 100 93* 94*  CO2 GLUCOSE 213* 166* 202*  BUN 7 5* 6  CREATININE 0.72 0.61 0.64  CALCIUM 9.2 9.2 9.1  MG  --  1.6*  --     GFR: Estimated Creatinine Clearance: 107.7 mL/min (by C-G formula based on SCr of 0.64 mg/dL). Liver Function Tests: Recent Labs  Lab 12/03/22 1047  AST 24  ALT 18  ALKPHOS 123  BILITOT 0.5  PROT 7.6  ALBUMIN 3.7    Recent Labs  Lab 12/03/22 1047  LIPASE 99*   No results for input(s): "AMMONIA" in the last 168  hours. Coagulation Profile: Recent Labs  Lab 12/05/22 0159  INR 1.1    Recent Results (from the past 240 hour(s))  Blood culture (routine x 2)     Status: None (Preliminary result)   Collection Time: 12/03/22 10:41 AM   Specimen: BLOOD LEFT ARM  Result Value Ref Range Status   Specimen Description BLOOD LEFT ARM  Final   Special Requests   Final    BOTTLES DRAWN AEROBIC AND ANAEROBIC Blood Culture adequate volume   Culture   Final    NO GROWTH 3 DAYS Performed at [facility], [address] [place], [place] [address]    Report Status PENDING  Incomplete  Blood culture (routine x 2)     Status: None (Preliminary result)   Collection Time: 12/03/22 10:47 AM   Specimen: BLOOD RIGHT ARM  Result Value Ref Range Status   Specimen Description BLOOD RIGHT ARM  Final   Special Requests   Final    BOTTLES DRAWN AEROBIC AND ANAEROBIC Blood Culture adequate volume   Culture   Final    NO GROWTH 3 DAYS Performed at [facility], [address], [place], [place] [address]    Report Status PENDING  Incomplete  Aerobic/Anaerobic Culture w Gram Stain (surgical/deep wound)     Status: None (Preliminary result)   Collection Time: 12/05/22 11:42 AM   Specimen: PATH Disc; Tissue  Result Value Ref Range Status   Specimen Description OTHER  Final   Special Requests  DISC  Final   Gram Stain   Final    NO WBC SEEN NO ORGANISMS SEEN Performed at [facility], [address], [place], [place] [address]    Culture PENDING  Incomplete   Report Status PENDING  Incomplete    Antimicrobials: Anti-infectives (From admission, onward)    Start     Dose/Rate Route Frequency Ordered Stop   12/05/22 1600  ceFEPIme (MAXIPIME) 2 g in sodium chloride 0.9 % 100 mL IVPB        2 g 200 mL/hr over 30 Minutes Intravenous Every 8 hours 12/05/22 1423     12/05/22 1515  DAPTOmycin (CUBICIN) 650 mg in sodium chloride 0.9 % IVPB        8 mg/kg  83.1 kg 126 mL/hr over 30 Minutes Intravenous  Daily 12/05/22 1423        Culture/Microbiology    Component Value Date/Time   SDES OTHER 12/05/2022 1142   SPECREQUEST  DISC 12/05/2022 1142   CULT PENDING 12/05/2022 1142   REPTSTATUS PENDING 12/05/2022 1142  Radiology Studies: IR Fluoro Guide Ndl Plmt / BX  Result Date: 12/06/2022 INDICATION: Severe low back pain secondary to osteomyelitis at T12-L1. EXAM: FLUOROSCOPIC GUIDED ASPIRATION AT T12-L1 MEDICATIONS: None. ANESTHESIA/SEDATION: Moderate (conscious) sedation was employed during this procedure. A total of Versed 2 mg and Fentanyl 50 mcg 1 mg Dilaudid was administered intravenously by the radiology nurse. Total intra-service moderate Sedation Time: 39 minutes. The patient's level of consciousness and vital signs were monitored continuously by radiology nursing throughout the procedure under my direct supervision. Radiation exposure: 7 minutes.  48 seconds.  743 mGY COMPLICATIONS: None immediate. PROCEDURE: Informed written consent was obtained from the patient after a thorough discussion of the procedural risks, benefits and alternatives. All questions were addressed. Maximal Sterile Barrier Technique was utilized including caps, mask, sterile gowns, sterile gloves, sterile drape, hand hygiene and skin antiseptic. A timeout was performed prior to the initiation of the procedure. The patient was laid prone on the fluoroscopic table. The skin overlying the thoracolumbar region was then prepped and draped in the usual sterile fashion. The T12-L1 disc space was identified in the biplane fluoro mode. The overlying skin was then infiltrated with 0.25% bupivacaine and extended into the paraspinal musculature. Using biplane intermittent fluoroscopy, a 21 gauge Franseen biopsy needle was then advanced without difficulty into the T12-L1 disc space crossing the midline on the AP projection. Using a 20 mL syringe aspirate was obtained. Small chunks of solid material mixed in with slightly blood-tinged  fluid was obtained and sent for microbiologic analysis. Hemostasis  was achieved at the skin entry sites. Patient tolerated the procedure well. Patient was then returned in stable condition to the floor. IMPRESSION: Status post fluoroscopic guided disc aspiration at T12-L1 as described above without event. Electronically Signed   By: Julieanne Cotton M.D.   On: 12/06/2022 07:40     LOS: 3 days   Lanae Boast, MD [facility]  12/06/2022, 10:27 AM

## 2022-12-06 NOTE — Progress Notes (Signed)
[facility]  Date of Admission:  12/03/2022     Total days of antibiotics 1         ASSESSMENT:  John Ware's aspiration is without organisms on gram stain and cultures pending. Now on broad spectrum coverage with Daptomycin and Cefepime. Blood cultures are without growth to date and will place a PICC line for anticipated prolonged course of antibiotics given findings on imaging. Will narrow antibiotics as cultures allow.  Continue current dose of daptomycin and cefepime. Therapeutic drug monitoring of CK levels and renal function and monitoring for any new coughs. Pain adequately controlled at present. Remaining medical and supportive care per Primary Team.   PLAN:  Continue current dose of Daptomycin and Cefepime. Monitor cultures and narrow antibiotics as able. PICC line placement.  Pain control and remaining medical and supportive care per Primary Team.  Dr. Earlene Plater is available over the weekend for ID related questions.   Principal Problem:   Osteomyelitis Active Problems:   Osteomyelitis of thoracic region   Intractable back pain   Medication management   Vertebral osteomyelitis    baclofen  5 mg Oral TID   DULoxetine  60 mg Oral QHS   enoxaparin (LOVENOX) injection  40 mg Subcutaneous Q24H   gabapentin  400 mg Oral BID   magnesium oxide  400 mg Oral BID   morphine  15 mg Oral BID   mupirocin ointment  1 Application Nasal BID    SUBJECTIVE:  Afebrile overnight with no acute events. Pain is better controlled.   Allergies  Allergen Reactions   Tizanidine Other (See Comments)    Leg cramps     Review of Systems: Review of Systems  Constitutional:  Negative for chills, fever and weight loss.  Respiratory:  Negative for cough, shortness of breath and wheezing.   Cardiovascular:  Negative for chest pain and leg swelling.  Gastrointestinal:  Negative for abdominal pain, constipation, diarrhea, nausea and vomiting.  Musculoskeletal:   Positive for back pain.  Skin:  Negative for rash.      OBJECTIVE: Vitals:   12/05/22 1502 12/05/22 1930 12/06/22 0421 12/06/22 0717  BP: (!) 165/110 (!) 129/94 (!) 141/98 (!) 127/92  Pulse: 89 98 83 86  Resp: 18 18 18 18   Temp: 98 F (36.7 C) 98.3 F (36.8 C) 98.6 F (37 C) 98 F (36.7 C)  TempSrc: Oral Oral  Oral  SpO2: 100% 96% 96% 99%  Weight:      Height:       Body mass index is 26.29 kg/m.  Physical Exam Constitutional:      General: He is not in acute distress.    Appearance: He is well-developed.  Cardiovascular:     Rate and Rhythm: Normal rate and regular rhythm.     Heart sounds: Normal heart sounds.  Pulmonary:     Effort: Pulmonary effort is normal.     Breath sounds: Normal breath sounds.  Skin:    General: Skin is warm and dry.  Neurological:     Mental Status: He is alert.  Psychiatric:        Mood and Affect: Mood normal.     Lab Results Lab Results  Component Value Date   WBC 9.9 12/06/2022   HGB 13.4 12/06/2022   HCT 39.2 12/06/2022   MCV 83.2 12/06/2022   PLT 422 (H) 12/06/2022    Lab Results  Component Value Date   CREATININE 0.64 12/06/2022   BUN 6 12/06/2022  NA 135 12/06/2022   K 3.5 12/06/2022   CL 94 (L) 12/06/2022   CO2 31 12/06/2022    Lab Results  Component Value Date   ALT 18 12/03/2022   AST 24 12/03/2022   ALKPHOS 123 12/03/2022   BILITOT 0.5 12/03/2022     Microbiology: Recent Results (from the past 240 hour(s))  Blood culture (routine x 2)     Status: None (Preliminary result)   Collection Time: 12/03/22 10:41 AM   Specimen: BLOOD LEFT ARM  Result Value Ref Range Status   Specimen Description BLOOD LEFT ARM  Final   Special Requests   Final    BOTTLES DRAWN AEROBIC AND ANAEROBIC Blood Culture adequate volume   Culture   Final    NO GROWTH 3 DAYS Performed at [facility], [address] [place], [place] [address]    Report Status PENDING  Incomplete  Blood culture (routine x 2)     Status:  None (Preliminary result)   Collection Time: 12/03/22 10:47 AM   Specimen: BLOOD RIGHT ARM  Result Value Ref Range Status   Specimen Description BLOOD RIGHT ARM  Final   Special Requests   Final    BOTTLES DRAWN AEROBIC AND ANAEROBIC Blood Culture adequate volume   Culture   Final    NO GROWTH 3 DAYS Performed at [facility], [address] [place], [place] [address]    Report Status PENDING  Incomplete  Aerobic/Anaerobic Culture w Gram Stain (surgical/deep wound)     Status: None (Preliminary result)   Collection Time: 12/05/22 11:42 AM   Specimen: PATH Disc; Tissue  Result Value Ref Range Status   Specimen Description OTHER  Final   Special Requests  DISC  Final   Gram Stain   Final    NO WBC SEEN NO ORGANISMS SEEN Performed at [facility], [address] [place], [place] [address]    Culture PENDING  Incomplete   Report Status PENDING  Incomplete     Marcos Eke, NP [facility]  12/06/2022  10:31 AM

## 2022-12-07 DIAGNOSIS — M4624 Osteomyelitis of vertebra, thoracic region: Secondary | ICD-10-CM | POA: Diagnosis not present

## 2022-12-07 LAB — BASIC METABOLIC PANEL
Anion gap: 8 (ref 5–15)
BUN: 6 mg/dL (ref 6–20)
CO2: 29 mmol/L (ref 22–32)
Calcium: 8.5 mg/dL — ABNORMAL LOW (ref 8.9–10.3)
Chloride: 95 mmol/L — ABNORMAL LOW (ref 98–111)
Creatinine, Ser: 0.53 mg/dL — ABNORMAL LOW (ref 0.61–1.24)
GFR, Estimated: >60 mL/min (ref >60)
Glucose, Bld: 151 mg/dL — ABNORMAL HIGH (ref 70–99)
Potassium: 3.6 mmol/L (ref 3.5–5.1)
Sodium: 132 mmol/L — ABNORMAL LOW (ref 135–145)

## 2022-12-07 LAB — CBC
HCT: 35.2 % — ABNORMAL LOW (ref 39.0–52.0)
Hemoglobin: 11.8 g/dL — ABNORMAL LOW (ref 13.0–17.0)
MCH: 28.4 pg (ref 26.0–34.0)
MCHC: 33.5 g/dL (ref 30.0–36.0)
MCV: 84.8 fL (ref 80.0–100.0)
Platelets: 405 10*3/uL — ABNORMAL HIGH (ref 150–400)
RBC: 4.15 MIL/uL — ABNORMAL LOW (ref 4.22–5.81)
RDW: 14 % (ref 11.5–15.5)
WBC: 9.2 10*3/uL (ref 4.0–10.5)
nRBC: 0 % (ref 0.0–0.2)

## 2022-12-07 LAB — AEROBIC/ANAEROBIC CULTURE W GRAM STAIN (SURGICAL/DEEP WOUND)

## 2022-12-07 MED ORDER — MORPHINE SULFATE ER 15 MG PO TBCR
30.0000 mg | EXTENDED_RELEASE_TABLET | Freq: Two times a day (BID) | ORAL | Status: DC
  Administered 2022-12-07 – 2022-12-09 (×4): 30 mg via ORAL
  Filled 2022-12-07 (×4): qty 2

## 2022-12-07 NOTE — Progress Notes (Signed)
PROGRESS NOTE John Ware  WUJ:[id number] DOB: 01/11/1966 DOA: 12/03/2022 PCP: Junie Spencer, FNP  Brief Narrative/Hospital Course: 57 y.o.m w/ hx of TV endocarditis, thoracic and lumbar spine osteomyelitis/ discitis status post IV antibiotics on chronic suppressive antibiotic treatment, history of MSSA and Pseudomonas infection, chronic narcotic dependence, chronic pain presented  w/  worsening back pain x 1 week. Was recently seen by infectious disease 2 weeks back and is on chronic Cipro and Doxy suppressive treatment.   In [id number] spine and lumbar spine MRI showed discitis osteomyelitis in the lower thoracic spine T10-12, early discitis L1-L3 5.9 x 2.0 x 2.0 cm fluid collection spanning L4-L5.Neurosurgery and ID- consulted and admitted.  Underwent T12/L1 disc aspiration by IR 4/11> daptomycin and cefepime started pending culture Blood cultures and tissue cultures are negative so far.   Subjective:  Seen and examined.  No overnight events.  Afebrile.  He thinks he can manage with oral pain medications.  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   Patient chronically on Cipro and doxycycline.MRI showed discitis osteomyelitis in the lower thoracic spine T10-12, early discitis L1-L3 5.9 x 2.0 x 2.0 cm fluid collection spanning L4-L5.   4/11: Blood cultures negative so far  4/11, tissue cultures negative so far.   Currently on daptomycin and cefepime.  PICC line on.   Await ID recommendation for home antibiotic therapy.     Chronic back pain and narcotic dependence: Pain managed with baclofen, Tylenol, gabapentin, MS Contin prn Dilaudid, and stable. Discontinue IV Dilaudid, resume home doses of MS Contin 30 mg twice daily and Percocet for short acting pain medications.   Mobilize.  Hypomagnesemia-adequate.  DVT prophylaxis: enoxaparin (LOVENOX) injection 40 mg Start: 12/03/22 1500 Code Status:   Code Status: Full Code Family Communication: plan of care discussed with patien at bedside. Patient status is: Inpatient because of concern for back infection Level of care: Med-Surg   Dispo: The patient is from: Home            Anticipated disposition: Home  in 1-2 days once culture finalized Objective: Vitals last 24 hrs: Vitals:   12/06/22 1950 12/06/22 2119 12/07/22 0427 12/07/22 0756  BP: (!) 152/105 (!) 145/99 118/83 (!) 133/96  Pulse: (!) 102 95 76 81  Resp:    18  Temp: 98 F (36.7 C)  (!) 97.5 F (36.4 C) 98 F (36.7 C)  TempSrc: Oral  Oral   SpO2: 100% 99% 96% 98%  Weight:      Height:       Weight change:   Physical Examination:  General: Looks comfortable.  Laying in bed.   Cardiovascular: S1-S2 normal.  No added sounds. Respiratory: Bilateral clear Gastrointestinal: Soft nontender Ext: No deformities Neuro: Generalized weakness.  No focal deficits.   Medications reviewed:  Scheduled Meds:  baclofen  5 mg Oral TID   Chlorhexidine Gluconate Cloth  6 each Topical Daily   DULoxetine  60 mg Oral QHS   enoxaparin (LOVENOX) injection  40 mg Subcutaneous Q24H   gabapentin  400 mg Oral BID   magnesium oxide  400 mg Oral BID   morphine  30 mg Oral Q12H   mupirocin ointment  1 Application Nasal BID  Continuous Infusions:  ceFEPime (MAXIPIME) IV 2 g (12/07/22 0749)   DAPTOmycin (CUBICIN) 650 mg in sodium  chloride 0.9 % IVPB 650 mg (12/06/22 2041)     Diet Order             Diet regular Room service appropriate? Yes; Fluid consistency: Thin  Diet effective now                   Intake/Output Summary (Last 24 hours) at 12/07/2022 1254 Last data filed at 12/07/2022 0900 Gross per 24 hour  Intake 340 ml  Output 1600 ml  Net -1260 ml   Net IO Since Admission: -1,269.94 mL [12/07/22 1254]  Wt Readings from Last 3 Encounters:   12/03/22 83.1 kg  11/20/22 83.1 kg  09/25/22 76.2 kg    Unresulted Labs (From admission, onward)     Start     Ordered   12/06/22 0500  Basic metabolic panel  Daily,   R     Question:  Specimen collection method  Answer:  Lab=Lab collect   12/05/22 1112   12/06/22 0500  CBC  Daily,   R     Question:  Specimen collection method  Answer:  Lab=Lab collect   12/05/22 1112   12/06/22 0500  CK  Every Friday,   R     Question:  Specimen collection method  Answer:  Lab=Lab collect   12/05/22 1423          Data Reviewed: I have personally reviewed following labs and imaging studies CBC: Recent Labs  Lab 12/03/22 1047 12/04/22 0212 12/05/22 0159 12/06/22 0633 12/07/22 0305  WBC 10.7* 10.1 8.2 9.9 9.2  NEUTROABS 8.4*  --   --   --   --   HGB 13.2 12.3* 12.9* 13.4 11.8*  HCT 40.4 37.1* 37.2* 39.2 35.2*  MCV 85.1 85.1 83.4 83.2 84.8  PLT 475* 429* 414* 422* 405*   Basic Metabolic Panel: Recent Labs  Lab 12/03/22 1047 12/05/22 0159 12/06/22 0633 12/07/22 0305  NA 140 134* 135 132*  K 3.9 3.7 3.5 3.6  CL 100 93* 94* 95*  CO2 GLUCOSE 213* 166* 202* 151*  BUN 7 5* 6 6  CREATININE 0.72 0.61 0.64 0.53*  CALCIUM 9.2 9.2 9.1 8.5*  MG  --  1.6*  --   --    GFR: Estimated Creatinine Clearance: 107.7 mL/min (A) (by C-G formula based on SCr of 0.53 mg/dL (L)). Liver Function Tests: Recent Labs  Lab 12/03/22 1047  AST 24  ALT 18  ALKPHOS 123  BILITOT 0.5  PROT 7.6  ALBUMIN 3.7   Recent Labs  Lab 12/03/22 1047  LIPASE 99*  No results for input(s): "AMMONIA" in the last 168 hours. Coagulation Profile: Recent Labs  Lab 12/05/22 0159  INR 1.1   Recent Results (from the past 240 hour(s))  Blood culture (routine x 2)     Status: None (Preliminary result)   Collection Time: 12/03/22 10:41 AM   Specimen: BLOOD LEFT ARM  Result Value Ref Range Status   Specimen Description BLOOD LEFT ARM  Final   Special Requests   Final    BOTTLES DRAWN AEROBIC AND  ANAEROBIC Blood Culture adequate volume   Culture   Final    NO GROWTH 4 DAYS Performed at [facility], [address], [place], [place] [address]    Report Status PENDING  Incomplete  Blood culture (routine x 2)     Status: None (Preliminary result)   Collection Time: 12/03/22 10:47 AM   Specimen: BLOOD RIGHT ARM  Result  Value Ref Range Status   Specimen Description BLOOD RIGHT ARM  Final   Special Requests   Final    BOTTLES DRAWN AEROBIC AND ANAEROBIC Blood Culture adequate volume   Culture   Final    NO GROWTH 4 DAYS Performed at [facility], [address] [place], [place] [address]    Report Status PENDING  Incomplete  Surgical PCR screen     Status: None   Collection Time: 12/03/22  5:10 PM   Specimen: Nasal Mucosa; Nasal Swab  Result Value Ref Range Status   MRSA, PCR NEGATIVE NEGATIVE Final   Staphylococcus aureus NEGATIVE NEGATIVE Final    Comment: (NOTE) The Xpert SA Assay (FDA approved for NASAL specimens in patients 35 years of age and older), is one component of a comprehensive surveillance program. It is not intended to diagnose infection nor to guide or monitor treatment. Performed at [facility], [address], [place], [place] [address]   Aerobic/Anaerobic Culture w Gram Stain (surgical/deep wound)     Status: None (Preliminary result)   Collection Time: 12/05/22 11:42 AM   Specimen: PATH Disc; Tissue  Result Value Ref Range Status   Specimen Description OTHER  Final   Special Requests  DISC  Final   Gram Stain NO WBC SEEN NO ORGANISMS SEEN   Final   Culture   Final    CULTURE REINCUBATED FOR BETTER GROWTH Performed at [facility], [address], [place], [place] [address]    Report Status PENDING  Incomplete    Antimicrobials: Anti-infectives (From admission, onward)    Start     Dose/Rate Route Frequency Ordered Stop   12/05/22 1600  ceFEPIme (MAXIPIME) 2 g in sodium chloride 0.9 % 100 mL IVPB        2 g 200 mL/hr  over 30 Minutes Intravenous Every 8 hours 12/05/22 1423     12/05/22 1515  DAPTOmycin (CUBICIN) 650 mg in sodium chloride 0.9 % IVPB        8 mg/kg  83.1 kg 126 mL/hr over 30 Minutes Intravenous Daily 12/05/22 1423        Culture/Microbiology    Component Value Date/Time   SDES OTHER 12/05/2022 1142   SPECREQUEST  DISC 12/05/2022 1142   CULT  12/05/2022 1142    CULTURE REINCUBATED FOR BETTER GROWTH Performed at [facility], [address], [place], [place] [address]    REPTSTATUS PENDING 12/05/2022 1142  Radiology Studies: [place] EKG SITE RITE  Result Date: 12/06/2022 If Site Rite image not attached, placement could not be confirmed due to current cardiac rhythm.    LOS: 4 days   Dorcas Carrow, MD [facility]  12/07/2022, 12:54 PM

## 2022-12-08 DIAGNOSIS — M4624 Osteomyelitis of vertebra, thoracic region: Secondary | ICD-10-CM | POA: Diagnosis not present

## 2022-12-08 LAB — BASIC METABOLIC PANEL
Anion gap: 7 (ref 5–15)
BUN: 5 mg/dL — ABNORMAL LOW (ref 6–20)
CO2: 29 mmol/L (ref 22–32)
Calcium: 8.3 mg/dL — ABNORMAL LOW (ref 8.9–10.3)
Chloride: 94 mmol/L — ABNORMAL LOW (ref 98–111)
Creatinine, Ser: 0.66 mg/dL (ref 0.61–1.24)
GFR, Estimated: >60 mL/min (ref >60)
Glucose, Bld: 278 mg/dL — ABNORMAL HIGH (ref 70–99)
Potassium: 3.5 mmol/L (ref 3.5–5.1)
Sodium: 130 mmol/L — ABNORMAL LOW (ref 135–145)

## 2022-12-08 LAB — CBC
HCT: 35.1 % — ABNORMAL LOW (ref 39.0–52.0)
Hemoglobin: 11.6 g/dL — ABNORMAL LOW (ref 13.0–17.0)
MCH: 28.2 pg (ref 26.0–34.0)
MCHC: 33 g/dL (ref 30.0–36.0)
MCV: 85.4 fL (ref 80.0–100.0)
Platelets: 414 10*3/uL — ABNORMAL HIGH (ref 150–400)
RBC: 4.11 MIL/uL — ABNORMAL LOW (ref 4.22–5.81)
RDW: 14.1 % (ref 11.5–15.5)
WBC: 7.1 10*3/uL (ref 4.0–10.5)
nRBC: 0 % (ref 0.0–0.2)

## 2022-12-08 LAB — CULTURE, BLOOD (ROUTINE X 2)
Culture: NO GROWTH
Culture: NO GROWTH
Special Requests: ADEQUATE

## 2022-12-08 NOTE — Progress Notes (Signed)
PROGRESS NOTE John Ware  SKA:[id number] DOB: 1966-05-26 DOA: 12/03/2022 PCP: Junie Spencer, FNP  Brief Narrative/Hospital Course: 57 y.o.m w/ hx of TV endocarditis, thoracic and lumbar spine osteomyelitis/ discitis status post IV antibiotics on chronic suppressive antibiotic treatment, history of MSSA and Pseudomonas infection, chronic narcotic dependence, chronic pain presented  w/  worsening back pain x 1 week. Was recently seen by infectious disease 2 weeks back and is on chronic Cipro and Doxy suppressive treatment.   In OM:[id number],LAGTXMIW spine and lumbar spine MRI showed discitis osteomyelitis in the lower thoracic spine T10-12, early discitis L1-L3 5.9 x 2.0 x 2.0 cm fluid collection spanning L4-L5.Neurosurgery and ID- consulted and admitted.  Underwent T12/L1 disc aspiration by IR 4/11> daptomycin and cefepime started pending culture Blood cultures negative so far. Tissue cultures, rare Staph epidermidis. Waiting for ID to finalize antibiotic plan.   Subjective:  Seen and examined.  No overnight events.  Afebrile.  He was wondering when he is going home.  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   Patient chronically on Cipro and doxycycline.MRI showed discitis osteomyelitis in the lower thoracic spine T10-12, early discitis L1-L3 5.9 x 2.0 x 2.0 cm fluid collection spanning L4-L5.   4/11: Blood cultures negative so far  4/11, tissue cultures growing very Staph epidermidis. Currently on daptomycin and cefepime.  PICC line on.   Await ID recommendation for home antibiotic therapy.     Chronic back pain and narcotic dependence: Pain managed with baclofen, Tylenol, gabapentin, MS Contin prn Dilaudid, and stable. Discontinue IV Dilaudid, resume home doses of MS Contin 30 mg  twice daily and Percocet for short acting pain medications.  Mobilize.  Hypomagnesemia-adequate.  DVT prophylaxis: enoxaparin (LOVENOX) injection 40 mg Start: 12/03/22 1500 Code Status:   Code Status: Full Code Family Communication: None at the bedside.  Patient is aware about plan of care. Patient status is: Inpatient because of concern for back infection Level of care: Med-Surg   Dispo: The patient is from: Home            Anticipated disposition: Home when cleared by ID. Objective: Vitals last 24 hrs: Vitals:   12/07/22 0427 12/07/22 0756 12/07/22 1500 12/07/22 2145  BP: 118/83 (!) 133/96 118/87 (!) 154/100  Pulse: 76 81 80   Resp:  18 18 18   Temp: (!) 97.5 F (36.4 C) 98 F (36.7 C) 98.3 F (36.8 C) 98.1 F (36.7 C)  TempSrc: Oral   Oral  SpO2: 96% 98% 99% 100%  Weight:      Height:       Weight change:   Physical Examination:  General: Looks comfortable.    Cardiovascular: S1-S2 normal.  No added sounds. Respiratory: Bilateral clear Gastrointestinal: Soft nontender Ext: No deformities Neuro: Generalized weakness.  No focal deficits.   Medications reviewed:  Scheduled Meds:  baclofen  5 mg Oral TID   Chlorhexidine Gluconate Cloth  6 each Topical Daily   DULoxetine  60 mg Oral QHS   enoxaparin (LOVENOX) injection  40 mg Subcutaneous Q24H   gabapentin  400 mg Oral BID   morphine  30 mg Oral Q12H   mupirocin ointment  1 Application Nasal BID  Continuous Infusions:  ceFEPime (MAXIPIME) IV 2 g (12/08/22 0941)   DAPTOmycin (CUBICIN) 650 mg in sodium chloride 0.9 % IVPB  650 mg (12/07/22 2155)     Diet Order             Diet regular Room service appropriate? Yes; Fluid consistency: Thin  Diet effective now                   Intake/Output Summary (Last 24 hours) at 12/08/2022 1135 Last data filed at 12/08/2022 0900 Gross per 24 hour  Intake 600 ml  Output 500 ml  Net 100 ml    Net IO Since Admission: -1,169.94 mL [12/08/22 1135]  Wt Readings from  Last 3 Encounters:  12/03/22 83.1 kg  11/20/22 83.1 kg  09/25/22 76.2 kg    Unresulted Labs (From admission, onward)     Start     Ordered   12/06/22 0500  Basic metabolic panel  Daily,   R     Question:  Specimen collection method  Answer:  Lab=Lab collect   12/05/22 1112   12/06/22 0500  CBC  Daily,   R     Question:  Specimen collection method  Answer:  Lab=Lab collect   12/05/22 1112   12/06/22 0500  CK  Every Friday,   R     Question:  Specimen collection method  Answer:  Lab=Lab collect   12/05/22 1423          Data Reviewed: I have personally reviewed following labs and imaging studies CBC: Recent Labs  Lab 12/03/22 1047 12/04/22 0212 12/05/22 0159 12/06/22 0633 12/07/22 0305 12/08/22 0352  WBC 10.7* 10.1 8.2 9.9 9.2 7.1  NEUTROABS 8.4*  --   --   --   --   --   HGB 13.2 12.3* 12.9* 13.4 11.8* 11.6*  HCT 40.4 37.1* 37.2* 39.2 35.2* 35.1*  MCV 85.1 85.1 83.4 83.2 84.8 85.4  PLT 475* 429* 414* 422* 405* 414*    Basic Metabolic Panel: Recent Labs  Lab 12/03/22 1047 12/05/22 0159 12/06/22 0633 12/07/22 0305 12/08/22 0352  NA 140 134* 135 132* 130*  K 3.9 3.7 3.5 3.6 3.5  CL 100 93* 94* 95* 94*  CO2 GLUCOSE 213* 166* 202* 151* 278*  BUN 7 5* 6 6 5*  CREATININE 0.72 0.61 0.64 0.53* 0.66  CALCIUM 9.2 9.2 9.1 8.5* 8.3*  MG  --  1.6*  --   --   --     GFR: Estimated Creatinine Clearance: 107.7 mL/min (by C-G formula based on SCr of 0.66 mg/dL). Liver Function Tests: Recent Labs  Lab 12/03/22 1047  AST 24  ALT 18  ALKPHOS 123  BILITOT 0.5  PROT 7.6  ALBUMIN 3.7    Recent Labs  Lab 12/03/22 1047  LIPASE 99*   No results for input(s): "AMMONIA" in the last 168 hours. Coagulation Profile: Recent Labs  Lab 12/05/22 0159  INR 1.1    Recent Results (from the past 240 hour(s))  Blood culture (routine x 2)     Status: None   Collection Time: 12/03/22 10:41 AM   Specimen: BLOOD LEFT ARM  Result Value Ref Range Status    Specimen Description BLOOD LEFT ARM  Final   Special Requests   Final    BOTTLES DRAWN AEROBIC AND ANAEROBIC Blood Culture adequate volume   Culture   Final    NO GROWTH 5 DAYS Performed at [facility], [address] [place], [place] [address]    Report Status 12/08/2022 FINAL  Final  Blood culture (routine x 2)  Status: None   Collection Time: 12/03/22 10:47 AM   Specimen: BLOOD RIGHT ARM  Result Value Ref Range Status   Specimen Description BLOOD RIGHT ARM  Final   Special Requests   Final    BOTTLES DRAWN AEROBIC AND ANAEROBIC Blood Culture adequate volume   Culture   Final    NO GROWTH 5 DAYS Performed at [facility], [address] [place], [place] [address]    Report Status 12/08/2022 FINAL  Final  Surgical PCR screen     Status: None   Collection Time: 12/03/22  5:10 PM   Specimen: Nasal Mucosa; Nasal Swab  Result Value Ref Range Status   MRSA, PCR NEGATIVE NEGATIVE Final   Staphylococcus aureus NEGATIVE NEGATIVE Final    Comment: (NOTE) The Xpert SA Assay (FDA approved for NASAL specimens in patients 38 years of age and older), is one component of a comprehensive surveillance program. It is not intended to diagnose infection nor to guide or monitor treatment. Performed at [facility], [address], [place], [place] [address]   Aerobic/Anaerobic Culture w Gram Stain (surgical/deep wound)     Status: None (Preliminary result)   Collection Time: 12/05/22 11:42 AM   Specimen: PATH Disc; Tissue  Result Value Ref Range Status   Specimen Description OTHER  Final   Special Requests  DISC  Final   Gram Stain   Final    NO WBC SEEN NO ORGANISMS SEEN Performed at [facility], [address] [place], [place] [address]    Culture   Final    RARE STAPHYLOCOCCUS EPIDERMIDIS NO ANAEROBES ISOLATED; CULTURE IN PROGRESS FOR 5 DAYS    Report Status PENDING  Incomplete   Organism ID, Bacteria STAPHYLOCOCCUS EPIDERMIDIS  Final       Susceptibility   Staphylococcus epidermidis - MIC*    CIPROFLOXACIN 4 RESISTANT Resistant     ERYTHROMYCIN >=8 RESISTANT Resistant     GENTAMICIN <=0.5 SENSITIVE Sensitive     OXACILLIN <=0.25 SENSITIVE Sensitive     TETRACYCLINE >=16 RESISTANT Resistant     VANCOMYCIN 2 SENSITIVE Sensitive     TRIMETH/SULFA 160 RESISTANT Resistant     CLINDAMYCIN >=8 RESISTANT Resistant     RIFAMPIN <=0.5 SENSITIVE Sensitive     Inducible Clindamycin NEGATIVE Sensitive     * RARE STAPHYLOCOCCUS EPIDERMIDIS    Antimicrobials: Anti-infectives (From admission, onward)    Start     Dose/Rate Route Frequency Ordered Stop   12/05/22 1600  ceFEPIme (MAXIPIME) 2 g in sodium chloride 0.9 % 100 mL IVPB        2 g 200 mL/hr over 30 Minutes Intravenous Every 8 hours 12/05/22 1423     12/05/22 1515  DAPTOmycin (CUBICIN) 650 mg in sodium chloride 0.9 % IVPB        8 mg/kg  83.1 kg 126 mL/hr over 30 Minutes Intravenous Daily 12/05/22 1423        Culture/Microbiology    Component Value Date/Time   SDES OTHER 12/05/2022 1142   SPECREQUEST  DISC 12/05/2022 1142   CULT  12/05/2022 1142    RARE STAPHYLOCOCCUS EPIDERMIDIS NO ANAEROBES ISOLATED; CULTURE IN PROGRESS FOR 5 DAYS    REPTSTATUS PENDING 12/05/2022 1142  Radiology Studies: No results found.   LOS: 5 days   Dorcas Carrow, MD [facility]  12/08/2022, 11:35 AM

## 2022-12-09 DIAGNOSIS — Z79899 Other long term (current) drug therapy: Secondary | ICD-10-CM | POA: Diagnosis not present

## 2022-12-09 DIAGNOSIS — M4624 Osteomyelitis of vertebra, thoracic region: Secondary | ICD-10-CM | POA: Diagnosis not present

## 2022-12-09 DIAGNOSIS — M4644 Discitis, unspecified, thoracic region: Secondary | ICD-10-CM | POA: Diagnosis not present

## 2022-12-09 LAB — BASIC METABOLIC PANEL
Anion gap: 8 (ref 5–15)
BUN: 7 mg/dL (ref 6–20)
CO2: 29 mmol/L (ref 22–32)
Calcium: 8.8 mg/dL — ABNORMAL LOW (ref 8.9–10.3)
Chloride: 99 mmol/L (ref 98–111)
Creatinine, Ser: 0.59 mg/dL — ABNORMAL LOW (ref 0.61–1.24)
GFR, Estimated: >60 mL/min (ref >60)
Glucose, Bld: 142 mg/dL — ABNORMAL HIGH (ref 70–99)
Potassium: 3.8 mmol/L (ref 3.5–5.1)
Sodium: 136 mmol/L (ref 135–145)

## 2022-12-09 LAB — CBC
HCT: 33.3 % — ABNORMAL LOW (ref 39.0–52.0)
Hemoglobin: 11.5 g/dL — ABNORMAL LOW (ref 13.0–17.0)
MCH: 28.8 pg (ref 26.0–34.0)
MCHC: 34.5 g/dL (ref 30.0–36.0)
MCV: 83.5 fL (ref 80.0–100.0)
Platelets: 399 10*3/uL (ref 150–400)
RBC: 3.99 MIL/uL — ABNORMAL LOW (ref 4.22–5.81)
RDW: 14 % (ref 11.5–15.5)
WBC: 8.7 10*3/uL (ref 4.0–10.5)
nRBC: 0 % (ref 0.0–0.2)

## 2022-12-09 LAB — SEDIMENTATION RATE: Sed Rate: 45 mm/hr — ABNORMAL HIGH (ref 0–16)

## 2022-12-09 MED ORDER — HEPARIN SOD (PORK) LOCK FLUSH 100 UNIT/ML IV SOLN
250.0000 [IU] | INTRAVENOUS | Status: AC | PRN
  Administered 2022-12-09: 250 [IU]

## 2022-12-09 MED ORDER — CEFAZOLIN SODIUM-DEXTROSE 2-4 GM/100ML-% IV SOLN
2.0000 g | Freq: Three times a day (TID) | INTRAVENOUS | Status: DC
  Administered 2022-12-09: 2 g via INTRAVENOUS
  Filled 2022-12-09: qty 100

## 2022-12-09 MED ORDER — CEFAZOLIN IV (FOR PTA / DISCHARGE USE ONLY)
2.0000 g | Freq: Three times a day (TID) | INTRAVENOUS | 0 refills | Status: AC
Start: 2022-12-09 — End: 2023-01-16

## 2022-12-09 MED ORDER — DOXYCYCLINE HYCLATE 100 MG PO TABS: 100.0000 mg | ORAL_TABLET | Freq: Two times a day (BID) | ORAL | 11 refills | Status: DC

## 2022-12-09 MED ORDER — DOXYCYCLINE HYCLATE 100 MG PO TABS: 100.0000 mg | ORAL_TABLET | Freq: Two times a day (BID) | ORAL | Status: DC

## 2022-12-09 NOTE — Progress Notes (Signed)
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  Admitted on 04/22/2022 with inability to walk, BLE numbness, intermittent fevers and urinary incontinence x3 weeks.  He was found to have acute discitis T9/10-T11/T12 with epidural phlegmon/abscess, septic arthritis T11-T12, extensive enhancement paraspinal soft tissue, as well as soft tissue abscess. 8/30 S/p fluoroscopy guided T1-11 disc aspiration ( Cx PsA), 8/31 aspiration of left back superficial abscess ( Cx PsA). Blood cx 8/28 1/2 sets staph pseudintermedius/epidermidis. This was followed by I&D of subcut abscess with decompression and debridement of osteomyelitis via T10-T11 laminectomy on 09/07 by [name] ( OR cx NG). Seen by ID and antibiotics switched to cefepime and planned for 6 weeks course EOT  10/19 with plan to continue Ciprofloxacin thereafter. Patient was also continued on doxycycline for hardware associated lumbar osteomyelitis. Patient was discharged to CIR due to functional decline on 9/12 and then discharged home from rehab on 9/22. Res-started on ciprofloxacin  po bid on 11/8 due to concerns of persistent infection in the setting of elevated inflammatory markers and continued on PO doxycycline admitted with:  # Persistent thoracic discitis/osteomyelitis with epidural phlegmon as well as 5.9 x 2 x 2 fluid collection at the L4-L5 interspinous space (post op seroma vs abscess, unchanged compared to 04/22/2022)  # Intractable back pain/severe spinal canal stenosis - per primary as well as neurosurgery   Recommendations Will switch IV antibiotics to cefazolin to cover MSSE isolated from disc aspirate cx. This will also cover MSSA which he was extensively infected in the past. Less inclined to cover PsA currently given he has received extensive IV as well as PO tx with cefepime followed by ciprofloxacin since 06/2022. I do not see any hardware in thoracic vertebrae from which PsA was isolated in the past. He had another cx growing PsA from native left shoulder in 2022 for which he has been adequately treated  Plan for 6 weeks of IV cefazolin from 4/11 to be followed by po thereafter Continue PO doxycycline indefinitely  Fu in clinic has been arranged Fu OR cx to completion otherwise ID will SO.   Rest of the management as per the primary team. Thank you for the consult. Please page with pertinent questions or concerns.  ______________________________________________________________________ Subjective patient seen and examined at the bedside. Back pain is better controlled. Asking when can he go home  Vitals BP (!) 139/90   Pulse 63   Temp 97.7 F (36.5 C) (Oral)   Resp 18   Ht  (1.778 m)   Wt 83.1 kg   SpO2 100%   BMI  26.29 kg/m     Physical  Exam Constitutional: Adult male lying in the bed and appears comfortable    Comments:   Cardiovascular:     Rate and Rhythm: Normal rate and regular rhythm.     Heart sounds:   Pulmonary:     Effort: Pulmonary effort is normal on room air     Comments:   Abdominal:     Palpations: Abdomen is soft.     Tenderness: non distended and non tender   Musculoskeletal:        General: No swelling or tenderness in peripheral joints   Skin:    Comments: No rashes, RT arm PICC with no concerns   Neurological:     General:awake, alert and oriented, grossly non focal, power in LE is 4 to 4+/5  Psychiatric:        Mood and Affect: Mood normal.     Pertinent Microbiology Results for orders placed or performed during the hospital encounter of 12/03/22  Blood culture (routine x 2)     Status: None   Collection Time: 12/03/22 10:41 AM   Specimen: BLOOD LEFT ARM  Result Value Ref Range Status   Specimen Description BLOOD LEFT ARM  Final   Special Requests   Final    BOTTLES DRAWN AEROBIC AND ANAEROBIC Blood Culture adequate volume   Culture   Final    NO GROWTH 5 DAYS Performed at [facility], [address] [place], [place] [address]    Report Status 12/08/2022 FINAL  Final  Blood culture (routine x 2)     Status: None   Collection Time: 12/03/22 10:47 AM   Specimen: BLOOD RIGHT ARM  Result Value Ref Range Status   Specimen Description BLOOD RIGHT ARM  Final   Special Requests   Final    BOTTLES DRAWN AEROBIC AND ANAEROBIC Blood Culture adequate volume   Culture   Final    NO GROWTH 5 DAYS Performed at [facility], [address], [place], [place] [address]    Report Status 12/08/2022 FINAL  Final  Surgical PCR screen     Status: None   Collection Time: 12/03/22  5:10 PM   Specimen: Nasal Mucosa; Nasal Swab  Result Value Ref Range Status   MRSA, PCR NEGATIVE NEGATIVE Final   Staphylococcus aureus NEGATIVE NEGATIVE Final    Comment: (NOTE) The Xpert SA Assay  (FDA approved for NASAL specimens in patients 46 years of age and older), is one component of a comprehensive surveillance program. It is not intended to diagnose infection nor to guide or monitor treatment. Performed at [facility], [address], [place], [place] [address]   Aerobic/Anaerobic Culture w Gram Stain (surgical/deep wound)     Status: None (Preliminary result)   Collection Time: 12/05/22 11:42 AM   Specimen: PATH Disc; Tissue  Result Value Ref Range Status   Specimen Description OTHER  Final   Special Requests  DISC  Final   Gram Stain   Final    NO WBC SEEN NO ORGANISMS SEEN Performed at [facility], [address] [place], [place] [address]    Culture   Final    RARE STAPHYLOCOCCUS EPIDERMIDIS NO ANAEROBES ISOLATED; CULTURE IN PROGRESS FOR 5 DAYS    Report Status PENDING  Incomplete   Organism ID, Bacteria STAPHYLOCOCCUS EPIDERMIDIS  Final      Susceptibility   Staphylococcus epidermidis - MIC*    CIPROFLOXACIN 4 RESISTANT Resistant  ERYTHROMYCIN >=8 RESISTANT Resistant     GENTAMICIN <=0.5 SENSITIVE Sensitive     OXACILLIN <=0.25 SENSITIVE Sensitive     TETRACYCLINE >=16 RESISTANT Resistant     VANCOMYCIN 2 SENSITIVE Sensitive     TRIMETH/SULFA 160 RESISTANT Resistant     CLINDAMYCIN >=8 RESISTANT Resistant     RIFAMPIN <=0.5 SENSITIVE Sensitive     Inducible Clindamycin NEGATIVE Sensitive     * RARE STAPHYLOCOCCUS EPIDERMIDIS   Pertinent Lab.    Latest Ref Rng & Units 12/09/2022    5:29 AM 12/08/2022    3:52 AM 12/07/2022    3:05 AM  CBC  WBC 4.0 - 10.5 K/uL 8.7  7.1  9.2   Hemoglobin 13.0 - 17.0 g/dL 45.4  09.8  11.9   Hematocrit 39.0 - 52.0 % 33.3  35.1  35.2   Platelets 150 - 400 K/uL 399  414  405       Latest Ref Rng & Units 12/09/2022    5:29 AM 12/08/2022    3:52 AM 12/07/2022    3:05 AM  CMP  Glucose 70 - 99 mg/dL 147  829  562   BUN 6 - 20 mg/dL Creatinine 0.61 - 1.24 mg/dL 1.30  8.65  7.84   Sodium 135 - 145  mmol/L 136  130  132   Potassium 3.5 - 5.1 mmol/L 3.8  3.5  3.6   Chloride 98 - 111 mmol/L 99  94  95   CO2 22 - 32 mmol/L Calcium 8.9 - 10.3 mg/dL 8.8  8.3  8.5     Pertinent Imaging today Plain films and CT images have been personally visualized and interpreted; radiology reports have been reviewed. Decision making incorporated into the Impression / Recommendations.  No results found.   I spent at least  50 minutes for this patient encounter including review of prior medical records, coordination of care with primary/other specialist with greater than 50% of time being face to face/counseling and discussing diagnostics/treatment plan with the patient/family.  Electronically signed by:   Odette Fraction, MD Infectious Disease Physician [facility] Pager: [phone]

## 2022-12-09 NOTE — TOC Progression Note (Addendum)
Transition of Care [facility]) - Progression Note    Patient Details  Name: John Ware MRN: [id number] Date of Birth: April 30, 1966  Transition of [facility]) CM/SW Contact  Lockie Pares, RN Phone Number: 12/09/2022, 10:13 AM  Clinical Narrative:     Gasper Lloyd from [facility] home infusion, she is aware of patient and  will continue to follow TOC following for IV abx at home. Patient has had [facility] for Home health RN  for previous infusions. Reached out to them to confirm  if they would be available for RN. 1253 acceptance is still pending, called and spoke to spouse who is in the room with the patient. They do not know of any particular agency that they would go with if [facility] could not accept. Work with who is contracted with insurance. Message sent to MD for OPAT orders. Pam from [facility] aware that the patient will go home today after 2 pm dose. 1304 [facility] Declined due to staffing issues. Messaged  Pam from [facility] to see if they can get [facility] to service patient. 1430 Spoke with Jeri Modena from infusion she will be over to educate patient and wife within the next hour. She is waiting on [facility] to accept for RN home health. Messaged with Nursing with update  Expected Discharge Plan: Home/Self Care Barriers to Discharge: Continued Medical Work up  Expected Discharge Plan and Services       Living arrangements for the past 2 months: Single Family Home                                       Social Determinants of Health (SDOH) Interventions SDOH Screenings   Food Insecurity: No Food Insecurity (12/03/2022)  Housing: Low Risk  (12/03/2022)  Transportation Needs: No Transportation Needs (12/03/2022)  Utilities: Not At Risk (12/03/2022)  Depression (PHQ2-9): Low Risk  (09/25/2022)  Recent Concern: Depression (PHQ2-9) - Medium Risk (08/16/2022)  Tobacco Use: Medium Risk (12/06/2022)    Readmission Risk Interventions    02/16/2021   12:11 PM  Readmission Risk  Prevention Plan  Transportation Screening Complete  PCP or Specialist Appt within 3-5 Days Complete  HRI or Home Care Consult Complete  Social Work Consult for Recovery Care Planning/Counseling Complete  Palliative Care Screening Complete  Medication Review [employer]) Complete

## 2022-12-09 NOTE — Progress Notes (Signed)
PHARMACY CONSULT NOTE FOR:  OUTPATIENT  PARENTERAL ANTIBIOTIC THERAPY (OPAT)  Indication: MSSE lumbar wound infection Regimen: Cefazolin 2g IV every 8 hours + Doxycycline 100 mg po bid (continued for suppression for hx MRSA) End date: 01/16/23 (6 weeks from IR asp 12/05/22)  IV antibiotic discharge orders are pended. To discharging provider:  please sign these orders via discharge navigator,  Select New Orders & click on the button choice - Manage This Unsigned Work.     Thank you for allowing pharmacy to be a part of this patient's care.  Georgina Pillion, PharmD, BCPS Infectious Diseases Clinical Pharmacist 12/09/2022 9:23 AM   **Pharmacist phone directory can now be found on [email or web address] ([email or web address]).  Listed under [facility].

## 2022-12-09 NOTE — Progress Notes (Signed)
OPAT note   Diagnosis: Thoracic discitis and osteomyelitis, epidural phlegmon  Culture Result: MSSE  Allergies  Allergen Reactions   Tizanidine Other (See Comments)    Leg cramps    OPAT Orders Discharge antibiotics to be given via PICC line Discharge antibiotics: cefazolin  Per pharmacy protocol  Duration: 6 weeks  End Date: 01/16/23  [facility] Per Protocol:  Home health RN for IV administration and teaching; PICC line care and labs.    Labs weekly while on IV antibiotics: _X_ CBC with differential _X_ BMP __ CMP _X_ CRP _X_ ESR __ Vancomycin trough __ CK  __ Please pull PIC at completion of IV antibiotics _X_ Please leave PIC in place until doctor has seen patient or been notified  Fax weekly labs to [phone]  Clinic Follow Up Appt:  Approx 3 weeks, consider fu imaging for epidural phlegmon  Odette Fraction, MD Infectious Disease Physician [facility] [address] [place], [place] [address] Phone: [phone]  Fax: [phone]

## 2022-12-09 NOTE — Plan of Care (Signed)

## 2022-12-09 NOTE — Plan of Care (Signed)
Patient discharge education given, no further questions, waiting on Pam for PICC line education. Patient flushed and capped off ready for home.   Problem: Education: Goal: Knowledge of General Education information will improve Description: Including pain rating scale, medication(s)/side effects and non-pharmacologic comfort measures 12/09/2022 1504 by Evern Core, RN Outcome: Adequate for Discharge 12/09/2022 0800 by Evern Core, RN Outcome: Progressing   Problem: Health Behavior/Discharge Planning: Goal: Ability to manage health-related needs will improve Outcome: Adequate for Discharge   Problem: Clinical Measurements: Goal: Ability to maintain clinical measurements within normal limits will improve Outcome: Adequate for Discharge Goal: Will remain free from infection Outcome: Adequate for Discharge Goal: Diagnostic test results will improve Outcome: Adequate for Discharge Goal: Respiratory complications will improve Outcome: Adequate for Discharge Goal: Cardiovascular complication will be avoided Outcome: Adequate for Discharge   Problem: Activity: Goal: Risk for activity intolerance will decrease 12/09/2022 1504 by Evern Core, RN Outcome: Adequate for Discharge 12/09/2022 0800 by Evern Core, RN Outcome: Progressing   Problem: Nutrition: Goal: Adequate nutrition will be maintained Outcome: Adequate for Discharge   Problem: Coping: Goal: Level of anxiety will decrease Outcome: Adequate for Discharge   Problem: Elimination: Goal: Will not experience complications related to bowel motility Outcome: Adequate for Discharge Goal: Will not experience complications related to urinary retention Outcome: Adequate for Discharge   Problem: Pain Managment: Goal: General experience of comfort will improve 12/09/2022 1504 by Evern Core, RN Outcome: Adequate for Discharge 12/09/2022 0800 by Evern Core, RN Outcome: Progressing   Problem:  Safety: Goal: Ability to remain free from injury will improve 12/09/2022 1504 by Evern Core, RN Outcome: Adequate for Discharge 12/09/2022 0800 by Evern Core, RN Outcome: Progressing   Problem: Skin Integrity: Goal: Risk for impaired skin integrity will decrease 12/09/2022 1504 by Evern Core, RN Outcome: Adequate for Discharge 12/09/2022 0800 by Evern Core, RN Outcome: Progressing

## 2022-12-09 NOTE — Discharge Summary (Signed)
Physician Discharge Summary  John Ware:[id number] DOB: 11/02/1965 DOA: 12/03/2022  PCP: Junie Spencer, FNP  Admit date: 12/03/2022 Discharge date: 12/09/2022  Admitted From: Home Disposition: Home with home health, infusion therapy  Recommendations for Outpatient Follow-up:  Follow up with PCP in 1-2 weeks Drug monitoring, every week serological monitoring and lab test as instructed by home health.  Home Health: Infusion therapy Equipment/Devices: PICC line.  Discharge Condition: Stable CODE STATUS: Full code Diet recommendation: Low-salt diet  Discharge summary: 57 y.o.m w/ hx of TV endocarditis, thoracic and lumbar spine osteomyelitis/ discitis status post IV antibiotics on chronic suppressive antibiotic treatment, history of MSSA and Pseudomonas infection, chronic narcotic dependence, chronic pain presented  w/  worsening back pain x 1 week. Was recently seen by infectious disease 2 weeks back and is on chronic Cipro and Doxy suppressive treatment.    In WJ:[id number],FAOZHYQM spine and lumbar spine MRI showed discitis osteomyelitis in the lower thoracic spine T10-12, early discitis L1-L3 5.9 x 2.0 x 2.0 cm fluid collection spanning L4-L5.Neurosurgery and ID- consulted and admitted.   Underwent T12/L1 disc aspiration by IR 4/11> daptomycin and cefepime started pending culture Blood cultures negative so far. Tissue cultures, Staph epidermidis.  Sensitive to oxacillin. Followed by ID.  Recommended discharge on IV cefazolin for 6 weeks and oral doxycycline forever. Patient is a stable.  Home infusion therapy has been set up. He is on chronic pain management with long-acting OxyContin and breakthrough oxycodone.  Continue.  Discharge Diagnoses:  Principal Problem:   Osteomyelitis Active Problems:   Osteomyelitis of thoracic region   Intractable back pain   Medication management   Vertebral osteomyelitis    Discharge Instructions  Discharge Instructions      [facility] pharmacist to adjust dose for Vancomycin, Aminoglycosides and other anti-infective therapies as requested by physician.   Complete by: As directed    [facility] to provide Cath Flo    Complete by: As directed    Administer for PICC line occlusion and as ordered by physician for other access device issues.   Anaphylaxis Kit: Provided to treat any anaphylactic reaction to the medication being provided to the patient if First Dose or when requested by physician   Complete by: As directed    Epinephrine /ml vial / amp: Administer 0.3mg  (0.37ml) subcutaneously once for moderate to severe anaphylaxis, nurse to call physician and pharmacy when reaction occurs and call 911 if needed for immediate care   Diphenhydramine /ml IV vial: Administer 25-50mg  IV/IM PRN for first dose reaction, rash, itching, mild reaction, nurse to call physician and pharmacy when reaction occurs   Sodium Chloride 0.9% NS IV: Administer if needed for hypovolemic blood pressure drop or as ordered by physician after call to physician with anaphylactic reaction   Change dressing on IV access line weekly and PRN   Complete by: As directed    Flush IV access with Sodium Chloride 0.9% and Heparin 10 units/ml or 100 units/ml   Complete by: As directed    Home infusion instructions - [facility]   Complete by: As directed    Instructions: Flush IV access with Sodium Chloride 0.9% and Heparin 10units/ml or 100units/ml   Change dressing on IV access line: Weekly and PRN   Instructions Cath Flo : Administer for PICC Line occlusion and as ordered by physician for other access device   [facility] pharmacist to adjust dose for: Vancomycin, Aminoglycosides and other anti-infective therapies as requested by  physician   Method of administration may be changed at the discretion of home infusion pharmacist based upon assessment of the patient and/or caregiver's ability to  self-administer the medication ordered   Complete by: As directed       Allergies as of 12/09/2022       Reactions   Tizanidine Other (See Comments)   Leg cramps        Medication List     STOP taking these medications    hydrOXYzine 10 MG tablet Commonly known as: ATARAX       TAKE these medications    acetaminophen 500 MG tablet Commonly known as: TYLENOL Take 1 tablet (500 mg total) by mouth every 6 (six) hours as needed.   Baclofen 5 MG Tabs Take 1 tablet (5 mg total) by mouth 3 (three) times daily.   ceFAZolin  IVPB Commonly known as: ANCEF Inject 2 g into the vein every 8 (eight) hours. Indication:  MSSE discitis/osteo First Dose: Yes Last Day of Therapy:  01/16/23 Labs - Once weekly:  CBC/D and BMP, Labs - Every other week:  ESR and CRP Method of administration: IV Push Pull PICC line at the completion of IV therapy Method of administration may be changed at the discretion of home infusion pharmacist based upon assessment of the patient and/or caregiver's ability to self-administer the medication ordered.   doxycycline 100 MG tablet Commonly known as: VIBRA-TABS Take 1 tablet (100 mg total) by mouth every 12 (twelve) hours.   DULoxetine 60 MG capsule Commonly known as: CYMBALTA Take 1 capsule (60 mg total) by mouth at bedtime.   FreeStyle Libre 2 Sensor Misc CHECK BLOOD SUGAR 4 TIMES A DAY   gabapentin 400 MG capsule Commonly known as: NEURONTIN Take 1 capsule (400 mg total) by mouth 2 (two) times daily.   morphine 30 MG 12 hr tablet Commonly known as: MS CONTIN Take 1 tablet by mouth every twelve hours What changed: Another medication with the same name was removed. Continue taking this medication, and follow the directions you see here.   naloxone 0.4 MG/ML injection Commonly known as: NARCAN Inject 1 mL (0.4 mg total) into the vein as needed.   oxyCODONE-acetaminophen 10-325 MG tablet Commonly known as: PERCOCET Take 1 tablet by mouth  every 6 (six) hours as needed (breakthrough pain).   oxyCODONE-acetaminophen 10-325 MG tablet Commonly known as: PERCOCET Take 1 tablet by mouth every 6 (six) hours. Stop taking methadone.   oxyCODONE-acetaminophen 10-325 MG tablet Commonly known as: PERCOCET Take 1 tablet by mouth 5 (five) times daily.   oxyCODONE-acetaminophen 10-325 MG tablet Commonly known as: PERCOCET Take 1 tablet by mouth 5 (five) times daily.   oxyCODONE-acetaminophen 10-325 MG tablet Commonly known as: PERCOCET Take 1 tablet by mouth 5 (five) times daily.   oxyCODONE-acetaminophen 10-325 MG tablet Commonly known as: PERCOCET Take 1 tablet by mouth 5 (five) times daily.   oxyCODONE-acetaminophen 10-325 MG tablet Commonly known as: PERCOCET Take 1 tablet by mouth five times a day               Discharge Care Instructions  (From admission, onward)           Start     Ordered   12/09/22 0000  Change dressing on IV access line weekly and PRN  (Home infusion instructions - [facility] )        12/09/22 1328            Allergies  Allergen Reactions  Tizanidine Other (See Comments)    Leg cramps    Consultations: IR ID   Procedures/Studies: [place] EKG SITE RITE  Result Date: 12/06/2022 If Site Rite image not attached, placement could not be confirmed due to current cardiac rhythm.  IR Fluoro Guide Ndl Plmt / BX  Result Date: 12/06/2022 INDICATION: Severe low back pain secondary to osteomyelitis at T12-L1. EXAM: FLUOROSCOPIC GUIDED ASPIRATION AT T12-L1 MEDICATIONS: None. ANESTHESIA/SEDATION: Moderate (conscious) sedation was employed during this procedure. A total of Versed 2 mg and Fentanyl 50 mcg 1 mg Dilaudid was administered intravenously by the radiology nurse. Total intra-service moderate Sedation Time: 39 minutes. The patient's level of consciousness and vital signs were monitored continuously by radiology nursing throughout the procedure under my direct supervision.  Radiation exposure: 7 minutes.  48 seconds.  743 mGY COMPLICATIONS: None immediate. PROCEDURE: Informed written consent was obtained from the patient after a thorough discussion of the procedural risks, benefits and alternatives. All questions were addressed. Maximal Sterile Barrier Technique was utilized including caps, mask, sterile gowns, sterile gloves, sterile drape, hand hygiene and skin antiseptic. A timeout was performed prior to the initiation of the procedure. The patient was laid prone on the fluoroscopic table. The skin overlying the thoracolumbar region was then prepped and draped in the usual sterile fashion. The T12-L1 disc space was identified in the biplane fluoro mode. The overlying skin was then infiltrated with 0.25% bupivacaine and extended into the paraspinal musculature. Using biplane intermittent fluoroscopy, a 21 gauge Franseen biopsy needle was then advanced without difficulty into the T12-L1 disc space crossing the midline on the AP projection. Using a 20 mL syringe aspirate was obtained. Small chunks of solid material mixed in with slightly blood-tinged fluid was obtained and sent for microbiologic analysis. Hemostasis was achieved at the skin entry sites. Patient tolerated the procedure well. Patient was then returned in stable condition to the floor. IMPRESSION: Status post fluoroscopic guided disc aspiration at T12-L1 as described above without event. Electronically Signed   By: Julieanne Cotton M.D.   On: 12/06/2022 07:40   CT Angio Chest/Abd/Pel for Dissection W and/or W/WO  Result Date: 12/03/2022 CLINICAL DATA:  Mid back pain, concern for aneurysm or dissection EXAM: CT ANGIOGRAPHY CHEST, ABDOMEN AND PELVIS TECHNIQUE: Non-contrast CT of the chest was initially obtained. Multidetector CT imaging through the chest, abdomen and pelvis was performed using the standard protocol during bolus administration of intravenous contrast. Multiplanar reconstructed images and MIPs were  obtained and reviewed to evaluate the vascular anatomy. RADIATION DOSE REDUCTION: This exam was performed according to the departmental dose-optimization program which includes automated exposure control, adjustment of the mA and/or kV according to patient size and/or use of iterative reconstruction technique. CONTRAST:  OMNIPAQUE IOHEXOL 350 MG/ML SOLN COMPARISON:  None Available. FINDINGS: CTA CHEST FINDINGS VASCULAR Aorta: Satisfactory opacification of the aorta. Normal contour and caliber of the thoracic aorta. No evidence of aneurysm, dissection, or other acute aortic pathology. Minimal scattered aortic atherosclerosis. Cardiovascular: No evidence of pulmonary embolism on limited non-tailored examination. Normal heart size. No pericardial effusion. Review of the MIP images confirms the above findings. NON VASCULAR Mediastinum/Nodes: No enlarged mediastinal, hilar, or axillary lymph nodes. Thyroid gland, trachea, and esophagus demonstrate no significant findings. Lungs/Pleura: Lungs are clear. No pleural effusion or pneumothorax. Musculoskeletal: Extensive disc degenerative disease and bridging osteophytosis throughout the thoracic and lumbar spine, in keeping with DISH. Paraspinous soft tissue thickening about T10 through T12 with extensive underlying bony sclerosis and erosion, particularly involving the T11-T12  disc space (series 7, image 116, series 11, image 112). Review of the MIP images confirms the above findings. CTA ABDOMEN AND PELVIS FINDINGS VASCULAR Mildly tortuous abdominal aorta. Otherwise normal contour and caliber of the abdominal aorta. No evidence of aneurysm, dissection, or other acute aortic pathology. Standard branching pattern of the abdominal aorta with solitary bilateral renal arteries. Review of the MIP images confirms the above findings. NON-VASCULAR Hepatobiliary: No solid liver abnormality is seen. No gallstones, gallbladder wall thickening, or biliary dilatation. Pancreas:  Unremarkable. No pancreatic ductal dilatation or surrounding inflammatory changes. Spleen: Normal in size without significant abnormality. Adrenals/Urinary Tract: Adrenal glands are unremarkable. Kidneys are normal, without renal calculi, solid lesion, or hydronephrosis. Bladder is unremarkable. Stomach/Bowel: Stomach is within normal limits. Appendix appears normal. No evidence of bowel wall thickening, distention, or inflammatory changes. Lymphatic: No enlarged abdominal or pelvic lymph nodes. Reproductive: No mass or other significant abnormality. Other: No abdominal wall hernia or abnormality. No ascites. Musculoskeletal: No acute osseous findings. Posterior discectomy, laminectomy, and fusion of L3 through S1. IMPRESSION: 1. Normal contour and caliber of the thoracic and abdominal aorta. No evidence of aneurysm, dissection, or other acute aortic pathology. Minimal scattered aortic atherosclerosis. 2. Paraspinous soft tissue thickening about T10 through T12 with extensive underlying bony sclerosis and erosion, particularly involving the T11-T12 disc space. Findings are consistent with discitis/osteomyelitis and better assessed by same-day MR examination of the thoracic and lumbar spine. Aortic Atherosclerosis (ICD10-I70.0). Electronically Signed   By: Jearld Lesch M.D.   On: 12/03/2022 13:11   MR LUMBAR SPINE WO CONTRAST  Result Date: 12/03/2022 CLINICAL DATA:  Concern for osteomyelitis. EXAM: MRI LUMBAR SPINE WITHOUT CONTRAST TECHNIQUE: Multiplanar, multisequence MR imaging of the lumbar spine was performed. No intravenous contrast was administered. COMPARISON:  MRI T Spine 05/01/22, MRI L Spine 04/22/22 FINDINGS: Limitations: Assessment is somewhat limited due to incomplete exam. Axial T1 weighted sequences were not acquired. Segmentation: There is lumbarization of S1 with a small disc space at S1-S2. Alignment:  Grade 1 anterolisthesis of L5 on S1. Vertebrae: There are postsurgical changes from L3-S1  posterior spinal fusion. Conus medullaris and cauda equina: Conus extends to the L1 level. Conus and cauda equina appear normal. Paraspinal and other soft tissues: There is abnormal T2/stir hyperintense signal surrounding the T11 and T12 vertebral bodies (series 5, image 3, 19). There is a 5.9 x 2.0 x 2.0 cm fluid collection spanning the L4-L5 interspinous space, at the site of prior laminectomy. Abnormal STIR hyperintense signal is also present in the L2-L3 disc space (series 5, image 9), which could suggest early discitis at this level, as well as at L1-L2. Disc levels: Redemonstrated are findings of discitis osteomyelitis in the lower thoracic spine at the T10-T12 vertebral body levels. There is abnormal T2/stir hyperintense signal at these levels, compatible with discitis. There is irregular endplate erosion, compatible with osteomyelitis. There is severe spinal canal stenosis at these levels secondary to the presence of abnormal fluid within the ventral and dorsal epidural space (series 8, image 6), most likely epidural phlegmon. There is an abnormal T2 hyperintense collection in the ventral epidural space at the L1 vertebral body level (series 8, image 15), which is nonspecific, but infection is not excluded. There is severe bilateral neural foraminal stenosis at L2-L3, which is a junctional level. There is severe left and moderate right neural foraminal stenosis at L3-L4 and L5-S1. Evidence of high-grade spinal canal stenosis. IMPRESSION: Incomplete exam. Axial T1 weighted sequences were not acquired. 1. Findings of discitis  osteomyelitis in the lower thoracic spine at the T10-T12 vertebral body levels, with severe spinal canal stenosis at these levels secondary to the presence of abnormal fluid within the ventral and dorsal epidural space, most likely epidural phlegmon. There is an abnormal T2 hyperintense collection in the ventral epidural space at the L1 vertebral body level, which is nonspecific, but  inferior extension of infection is not excluded. These findinsg are unchanged from 05/01/22. 2. Abnormal STIR hyperintense signal in the L1-L2 and L2-L3 disc spaces could suggest early discitis. 3. Severe bilateral neural foraminal stenosis at L2-L3, which is a junctional level. Severe left and moderate right neural foraminal stenosis at L3-L4 and L5-S1. 4. 5.9 x 2.0 x 2.0 cm fluid collection spanning the L4-L5 interspinous space, at the site of prior laminectomy, which could reflect a postoperative seroma, although abscess is not excluded. This is unchanged compared to 04/22/22 Electronically Signed   By: Lorenza Cambridge M.D.   On: 12/03/2022 12:41   (Echo, Carotid, EGD, Colonoscopy, ERCP)    Subjective: Patient seen and examined.  Denies any complaints.  Eager to go home.   Discharge Exam: Vitals:   12/09/22 0539 12/09/22 0723  BP: (!) 131/95 (!) 139/90  Pulse: 68 63  Resp: 18   Temp: 98.2 F (36.8 C) 97.7 F (36.5 C)  SpO2: 100% 100%   Vitals:   12/08/22 1420 12/08/22 1956 12/09/22 0539 12/09/22 0723  BP: (!) 162/99 136/85 (!) 131/95 (!) 139/90  Pulse: 79 83 68 63  Resp: 18  18   Temp:  97.9 F (36.6 C) 98.2 F (36.8 C) 97.7 F (36.5 C)  TempSrc:  Oral Oral Oral  SpO2: 100% 97% 100% 100%  Weight:      Height:        General: Pt is alert, awake, not in acute distress Cardiovascular: RRR, S1/S2 +, no rubs, no gallops Respiratory: CTA bilaterally, no wheezing, no rhonchi Abdominal: Soft, NT, ND, bowel sounds + Extremities: no edema, no cyanosis    The results of significant diagnostics from this hospitalization (including imaging, microbiology, ancillary and laboratory) are listed below for reference.     Microbiology: Recent Results (from the past 240 hour(s))  Blood culture (routine x 2)     Status: None   Collection Time: 12/03/22 10:41 AM   Specimen: BLOOD LEFT ARM  Result Value Ref Range Status   Specimen Description BLOOD LEFT ARM  Final   Special Requests   Final     BOTTLES DRAWN AEROBIC AND ANAEROBIC Blood Culture adequate volume   Culture   Final    NO GROWTH 5 DAYS Performed at [facility], [address], [place], [place] [address]    Report Status 12/08/2022 FINAL  Final  Blood culture (routine x 2)     Status: None   Collection Time: 12/03/22 10:47 AM   Specimen: BLOOD RIGHT ARM  Result Value Ref Range Status   Specimen Description BLOOD RIGHT ARM  Final   Special Requests   Final    BOTTLES DRAWN AEROBIC AND ANAEROBIC Blood Culture adequate volume   Culture   Final    NO GROWTH 5 DAYS Performed at [facility], [address], [place], [place] [address]    Report Status 12/08/2022 FINAL  Final  Surgical PCR screen     Status: None   Collection Time: 12/03/22  5:10 PM   Specimen: Nasal Mucosa; Nasal Swab  Result Value Ref Range Status   MRSA, PCR NEGATIVE  NEGATIVE Final   Staphylococcus aureus NEGATIVE NEGATIVE Final    Comment: (NOTE) The Xpert SA Assay (FDA approved for NASAL specimens in patients 78 years of age and older), is one component of a comprehensive surveillance program. It is not intended to diagnose infection nor to guide or monitor treatment. Performed at [facility], [address] [place], [place] [address]   Aerobic/Anaerobic Culture w Gram Stain (surgical/deep wound)     Status: None (Preliminary result)   Collection Time: 12/05/22 11:42 AM   Specimen: PATH Disc; Tissue  Result Value Ref Range Status   Specimen Description OTHER  Final   Special Requests  DISC  Final   Gram Stain   Final    NO WBC SEEN NO ORGANISMS SEEN Performed at [facility], [address], [place], [place] [address]    Culture   Final    RARE STAPHYLOCOCCUS EPIDERMIDIS NO ANAEROBES ISOLATED; CULTURE IN PROGRESS FOR 5 DAYS    Report Status PENDING  Incomplete   Organism ID, Bacteria STAPHYLOCOCCUS EPIDERMIDIS  Final      Susceptibility   Staphylococcus epidermidis - MIC*    CIPROFLOXACIN 4 RESISTANT  Resistant     ERYTHROMYCIN >=8 RESISTANT Resistant     GENTAMICIN <=0.5 SENSITIVE Sensitive     OXACILLIN <=0.25 SENSITIVE Sensitive     TETRACYCLINE >=16 RESISTANT Resistant     VANCOMYCIN 2 SENSITIVE Sensitive     TRIMETH/SULFA 160 RESISTANT Resistant     CLINDAMYCIN >=8 RESISTANT Resistant     RIFAMPIN <=0.5 SENSITIVE Sensitive     Inducible Clindamycin NEGATIVE Sensitive     * RARE STAPHYLOCOCCUS EPIDERMIDIS     Labs: BNP (last 3 results) No results for input(s): "BNP" in the last 8760 hours. Basic Metabolic Panel: Recent Labs  Lab 12/05/22 0159 12/06/22 0633 12/07/22 0305 12/08/22 0352 12/09/22 0529  NA 134* 135 132* 130* 136  K 3.7 3.5 3.6 3.5 3.8  CL 93* 94* 95* 94* 99  CO2 30 31 29 29 29   GLUCOSE 166* 202* 151* 278* 142*  BUN 5* 6 6 5* 7  CREATININE 0.61 0.64 0.53* 0.66 0.59*  CALCIUM 9.2 9.1 8.5* 8.3* 8.8*  MG 1.6*  --   --   --   --    Liver Function Tests: Recent Labs  Lab 12/03/22 1047  AST 24  ALT 18  ALKPHOS 123  BILITOT 0.5  PROT 7.6  ALBUMIN 3.7   Recent Labs  Lab 12/03/22 1047  LIPASE 99*   No results for input(s): "AMMONIA" in the last 168 hours. CBC: Recent Labs  Lab 12/03/22 1047 12/04/22 0212 12/05/22 0159 12/06/22 1478 12/07/22 0305 12/08/22 0352 12/09/22 0529  WBC 10.7*   < > 8.2 9.9 9.2 7.1 8.7  NEUTROABS 8.4*  --   --   --   --   --   --   HGB 13.2   < > 12.9* 13.4 11.8* 11.6* 11.5*  HCT 40.4   < > 37.2* 39.2 35.2* 35.1* 33.3*  MCV 85.1   < > 83.4 83.2 84.8 85.4 83.5  PLT 475*   < > 414* 422* 405* 414* 399   < > = values in this interval not displayed.   Cardiac Enzymes: Recent Labs  Lab 12/06/22 0633  CKTOTAL 77   BNP: Invalid input(s): "POCBNP" CBG: No results for input(s): "GLUCAP" in the last 168 hours. D-Dimer No results for input(s): "DDIMER" in the last 72 hours. Hgb A1c No results for  input(s): "HGBA1C" in the last 72 hours. Lipid Profile No results for input(s): "CHOL", "HDL", "LDLCALC", "TRIG",  "CHOLHDL", "LDLDIRECT" in the last 72 hours. Thyroid function studies No results for input(s): "TSH", "T4TOTAL", "T3FREE", "THYROIDAB" in the last 72 hours.  Invalid input(s): "FREET3" Anemia work up No results for input(s): "VITAMINB12", "FOLATE", "FERRITIN", "TIBC", "IRON", "RETICCTPCT" in the last 72 hours. Urinalysis    Component Value Date/Time   COLORURINE STRAW (A) 12/03/2022 1325   APPEARANCEUR CLEAR 12/03/2022 1325   LABSPEC >1.046 (H) 12/03/2022 1325   PHURINE 6.0 12/03/2022 1325   GLUCOSEU NEGATIVE 12/03/2022 1325   HGBUR NEGATIVE 12/03/2022 1325   BILIRUBINUR NEGATIVE 12/03/2022 1325   KETONESUR NEGATIVE 12/03/2022 1325   PROTEINUR NEGATIVE 12/03/2022 1325   NITRITE NEGATIVE 12/03/2022 1325   LEUKOCYTESUR NEGATIVE 12/03/2022 1325   Sepsis Labs Recent Labs  Lab 12/06/22 0633 12/07/22 0305 12/08/22 0352 12/09/22 0529  WBC 9.9 9.2 7.1 8.7   Microbiology Recent Results (from the past 240 hour(s))  Blood culture (routine x 2)     Status: None   Collection Time: 12/03/22 10:41 AM   Specimen: BLOOD LEFT ARM  Result Value Ref Range Status   Specimen Description BLOOD LEFT ARM  Final   Special Requests   Final    BOTTLES DRAWN AEROBIC AND ANAEROBIC Blood Culture adequate volume   Culture   Final    NO GROWTH 5 DAYS Performed at [facility], [address] [place], [place] [address]    Report Status 12/08/2022 FINAL  Final  Blood culture (routine x 2)     Status: None   Collection Time: 12/03/22 10:47 AM   Specimen: BLOOD RIGHT ARM  Result Value Ref Range Status   Specimen Description BLOOD RIGHT ARM  Final   Special Requests   Final    BOTTLES DRAWN AEROBIC AND ANAEROBIC Blood Culture adequate volume   Culture   Final    NO GROWTH 5 DAYS Performed at [facility], [address] [place], [place] [address]    Report Status 12/08/2022 FINAL  Final  Surgical PCR screen     Status: None   Collection Time: 12/03/22  5:10 PM   Specimen: Nasal  Mucosa; Nasal Swab  Result Value Ref Range Status   MRSA, PCR NEGATIVE NEGATIVE Final   Staphylococcus aureus NEGATIVE NEGATIVE Final    Comment: (NOTE) The Xpert SA Assay (FDA approved for NASAL specimens in patients 25 years of age and older), is one component of a comprehensive surveillance program. It is not intended to diagnose infection nor to guide or monitor treatment. Performed at [facility], [address], [place], [place] [address]   Aerobic/Anaerobic Culture w Gram Stain (surgical/deep wound)     Status: None (Preliminary result)   Collection Time: 12/05/22 11:42 AM   Specimen: PATH Disc; Tissue  Result Value Ref Range Status   Specimen Description OTHER  Final   Special Requests  DISC  Final   Gram Stain   Final    NO WBC SEEN NO ORGANISMS SEEN Performed at [facility] [facility], [address], [place], [place] [address]    Culture   Final    RARE STAPHYLOCOCCUS EPIDERMIDIS NO ANAEROBES ISOLATED; CULTURE IN PROGRESS FOR 5 DAYS    Report Status PENDING  Incomplete   Organism ID, Bacteria STAPHYLOCOCCUS EPIDERMIDIS  Final      Susceptibility   Staphylococcus epidermidis - MIC*    CIPROFLOXACIN 4 RESISTANT Resistant  ERYTHROMYCIN >=8 RESISTANT Resistant     GENTAMICIN <=0.5 SENSITIVE Sensitive     OXACILLIN <=0.25 SENSITIVE Sensitive     TETRACYCLINE >=16 RESISTANT Resistant     VANCOMYCIN 2 SENSITIVE Sensitive     TRIMETH/SULFA 160 RESISTANT Resistant     CLINDAMYCIN >=8 RESISTANT Resistant     RIFAMPIN <=0.5 SENSITIVE Sensitive     Inducible Clindamycin NEGATIVE Sensitive     * RARE STAPHYLOCOCCUS EPIDERMIDIS     Time coordinating discharge:  35 minutes  SIGNED:   Dorcas Carrow, MD  [facility] 12/09/2022, 1:28 PM

## 2022-12-10 ENCOUNTER — Encounter: Payer: Self-pay | Admitting: Infectious Diseases

## 2022-12-10 ENCOUNTER — Telehealth: Payer: Self-pay

## 2022-12-10 LAB — AEROBIC/ANAEROBIC CULTURE W GRAM STAIN (SURGICAL/DEEP WOUND): Gram Stain: NONE SEEN

## 2022-12-10 NOTE — Transitions of Care (Post Inpatient/ED Visit) (Signed)
   12/10/2022  Name: John Ware MRN: [id number] DOB: 1965-10-02  Today's TOC FU Call Status: Today's TOC FU Call Status:: Successful TOC FU Call Competed TOC FU Call Complete Date: 12/10/22  Transition Care Management Follow-up Telephone Call Date of Discharge: 12/09/22 Discharge Facility: [facility]) Type of Discharge: Inpatient Admission Primary Inpatient Discharge Diagnosis:: osteomyelitis How have you been since you were released from the hospital?: Better Any questions or concerns?: No  Items Reviewed: Did you receive and understand the discharge instructions provided?: Yes Medications obtained and verified?: Yes (Medications Reviewed) Any new allergies since your discharge?: No Dietary orders reviewed?: Yes Do you have support at home?: Yes People in Home: spouse  Home Care and Equipment/Supplies: Were Home Health Services Ordered?: Yes Name of Home Health Agency:: [facility] Has Agency set up a time to come to your home?: Yes First Home Health Visit Date: 12/10/22 Any new equipment or medical supplies ordered?: NA  Functional Questionnaire: Do you need assistance with bathing/showering or dressing?: No Do you need assistance with meal preparation?: No Do you need assistance with eating?: No Do you have difficulty maintaining continence: No Do you need assistance with getting out of bed/getting out of a chair/moving?: No Do you have difficulty managing or taking your medications?: No  Follow up appointments reviewed: PCP Follow-up appointment confirmed?: No (no avail appt. sent message to staff to schedule) MD Provider Line Number:[phone] Given: No Specialist Hospital Follow-up appointment confirmed?: Yes Date of Specialist follow-up appointment?: 12/31/22 Follow-Up Specialty Provider:: ID Do you need transportation to your follow-up appointment?: No Do you understand care options if your condition(s) worsen?: Yes-patient verbalized  understanding    SIGNATURE Karena Addison, LPN [facility] Nurse Health Advisor Direct Dial [phone]

## 2022-12-10 NOTE — Progress Notes (Signed)
ID Brief Note     Component 5 d ago  Specimen Description OTHER  Special Requests  DISC  Gram Stain NO WBC SEEN NO ORGANISMS SEEN  Culture RARE STAPHYLOCOCCUS EPIDERMIDIS NO ANAEROBES ISOLATED Performed at [facility], [address], [place], [place] [address]  Report Status 12/10/2022 FINAL  Organism ID, Bacteria STAPHYLOCOCCUS EPIDERMIDIS  Resulting Agency [facility]     Susceptibility   Staphylococcus epidermidis    MIC    CIPROFLOXACIN 4 RESISTANT Resistant    CLINDAMYCIN >=8 RESISTANT Resistant    ERYTHROMYCIN >=8 RESISTANT Resistant    GENTAMICIN <=0.5 SENSI... Sensitive    Inducible Clindamycin NEGATIVE Sensitive    OXACILLIN <=0.25 SENS... Sensitive    RIFAMPIN <=0.5 SENSI... Sensitive    TETRACYCLINE >=16 RESIST... Resistant    TRIMETH/SULFA 160 RESISTANT Resistant    VANCOMYCIN 2 SENSITIVE Sensitive           Susceptibility Comments  Staphylococcus epidermidis  RARE STAPHYLOCOCCUS EPIDERMIDIS

## 2022-12-11 ENCOUNTER — Other Ambulatory Visit (HOSPITAL_COMMUNITY): Payer: Self-pay

## 2022-12-11 MED ORDER — MORPHINE SULFATE ER 30 MG PO TBCR
30.0000 mg | EXTENDED_RELEASE_TABLET | Freq: Two times a day (BID) | ORAL | 0 refills | Status: DC
  Filled 2022-12-11 – 2022-12-12 (×2): qty 60, 30d supply, fill #0

## 2022-12-11 MED ORDER — OXYCODONE-ACETAMINOPHEN 10-325 MG PO TABS
1.0000 | ORAL_TABLET | Freq: Every day | ORAL | 0 refills | Status: DC
  Filled 2022-12-12: qty 150, 30d supply, fill #0

## 2022-12-12 ENCOUNTER — Other Ambulatory Visit (HOSPITAL_COMMUNITY): Payer: Self-pay

## 2022-12-17 ENCOUNTER — Inpatient Hospital Stay: Payer: Commercial Managed Care - PPO

## 2022-12-23 ENCOUNTER — Ambulatory Visit (INDEPENDENT_AMBULATORY_CARE_PROVIDER_SITE_OTHER): Payer: Commercial Managed Care - PPO | Admitting: Family

## 2022-12-23 VITALS — BP 143/84 | HR 79 | Temp 97.0°F | Ht 70.0 in | Wt 174.0 lb

## 2022-12-23 DIAGNOSIS — G8929 Other chronic pain: Secondary | ICD-10-CM | POA: Diagnosis not present

## 2022-12-23 DIAGNOSIS — M549 Dorsalgia, unspecified: Secondary | ICD-10-CM

## 2022-12-23 DIAGNOSIS — M4625 Osteomyelitis of vertebra, thoracolumbar region: Secondary | ICD-10-CM | POA: Diagnosis not present

## 2022-12-23 DIAGNOSIS — M869 Osteomyelitis, unspecified: Secondary | ICD-10-CM

## 2022-12-23 DIAGNOSIS — Z09 Encounter for follow-up examination after completed treatment for conditions other than malignant neoplasm: Secondary | ICD-10-CM

## 2022-12-23 NOTE — Patient Instructions (Signed)
Dupuytren's Contracture Dupuytren's contracture is a condition in which tissue under the skin of the palm becomes thick. This causes one or more of the fingers to curl inward (contract) toward the palm. After a while, the fingers may not be able to straighten out. This condition affects some or all of the fingers and the palm of the hand. This condition may affect one or both hands. Dupuytren's contracture is a long-term (chronic) condition that develops (progresses) slowly over time. There is no cure, but symptoms can be managed and progression can be slowed with treatment. This condition is usually not dangerous or painful, but it can interfere with everyday tasks. What are the causes?  This condition is caused by tissue (fascia) in the palm that gets thicker and tighter. When the fascia thickens, it pulls on the cords of tissue (tendons) that control finger movement. This causes the fingers to contract. The cause of fascia thickening is not known. However, the condition is often passed along from parent to child (inherited). What increases the risk? The following factors may make you more likely to develop this condition: Being 40 years of age or older. Being male. Having a family history of this condition. Using tobacco products, including cigarettes, chewing tobacco, and e-cigarettes. Drinking alcohol excessively. Having diabetes. Having a seizure disorder. What are the signs or symptoms? Early symptoms of this condition may include: Thick, puckered skin on the hand. One or more lumps (nodules) on the palm. Nodules may be tender when they first appear, but they are generally painless. Later symptoms of this condition may include: Thick cords of tissue in the palm. Fingers curled up toward the palm. Inability to straighten the fingers into their normal position. Though this condition is usually painless, you may have discomfort when holding or grabbing objects. How is this  diagnosed? This condition is diagnosed with a physical exam, which may include: Looking at your hands and feeling your palms. This is to check for thickened fascia and nodules. Measuring finger motion. Doing the [place] tabletop test. You may be asked to try to put your hand on a surface, with your palm down and your fingers straight out. How is this treated? There is no cure for this condition, but treatment can relieve discomfort and make symptoms more manageable. Treatment options may include: Physical therapy. This can strengthen your hand and increase flexibility. Occupational therapy. This can help you with everyday tasks that may be more difficult because of your condition. Shots (injections). Substances may be injected into your hand, such as: Medicines that help to decrease swelling (corticosteroids). Proteins (collagenase) to weaken thick tissue. After a collagenase injection, your health care provider may stretch your fingers. Needle aponeurotomy. A needle is pushed through the skin and into the fascia. Moving the needle against the fascia can weaken or break up the thick tissue. Surgery. This may be needed if your condition causes discomfort or interferes with everyday activities. Physical therapy is usually needed after surgery. No treatment is guaranteed to cure this condition. Recurrence of symptoms is common. Follow these instructions at home: Hand care Take these actions to help protect your hand from possible injury: Use tools that have padded grips. Wear protective gloves while you work with your hands. Avoid repetitive hand movements. General instructions Take over-the-counter and prescription medicines only as told by your health care provider. Manage any other conditions that you have, such as diabetes. If physical therapy was prescribed, do exercises as told by your health care provider. Do not   use any products that contain nicotine or tobacco, such as cigarettes,  e-cigarettes, and chewing tobacco. If you need help quitting, ask your health care provider. If you drink alcohol: Limit how much you have to: 0-1 drink a day for women who are not pregnant. 0-2 drinks a day for men. Be aware of how much alcohol is in your drink. In the [place] one drink equals one 12 oz bottle of beer (355 mL), one 5 oz glass of wine (148 mL), or one 1 oz glass of hard liquor (44 mL). Keep all follow-up visits as told by your health care provider. This is important. Contact a health care provider if: You develop new symptoms, or your symptoms get worse. You have pain that gets worse or does not get better with medicine. You have difficulty or discomfort with everyday tasks. You develop numbness or tingling. Get help right away if: You have severe pain. Your fingers change color or become unusually cold. Summary Dupuytren's contracture is a condition in which tissue under the skin of the palm becomes thick. This condition is caused by tissue (fascia) that thickens. When it thickens, it pulls on the cords of tissue (tendons) that control finger movement and makes the fingers contract. You are more likely to develop this condition if you are a man, are over 40 years of age, have a family history of the condition, and drink a lot of alcohol. This condition can be treated with physical and occupational therapy, injections, and surgery. Follow instructions about how to care for your hand. Get help right away if you have severe pain or your fingers change color or become cold. This information is not intended to replace advice given to you by your health care provider. Make sure you discuss any questions you have with your health care provider. Document Revised: 11/21/2020 Document Reviewed: 11/21/2020 Elsevier Patient Education  2023 [employer]  

## 2022-12-23 NOTE — Progress Notes (Signed)
Subjective:    Patient ID: John Ware, male    DOB: [birth year], 57 y.o.   MRN: [id number]  Chief Complaint  Patient presents with   Hospitalization Follow-up   Pt presents to the office today for hospital follow up. He went to the ED on 12/03/22 with back pain and found to have osteomyelitis oft thoracic region, vertebral osteomyelitis. He is followed by ID.  MRI showed, "discitis osteomyelitis in the lower thoracic spine T10-12, early discitis L1-L3 5.9 x 2.0 x 2.0 cm fluid collection spanning L4-L5.Neurosurgery and ID- consulted and admitted. "  He has a PICC line and getting Cefazolin every 8 hours. Has home health coming once a week for labs and PICC line change.   He is taking oral doxycyline 100 mg BID.   He is followed by Neurosurgeon. He reports his pain is a 2 out 10. He is followed by Pain Clinic once a month.   Back Pain This is a chronic problem. The current episode started more than 1 year ago. The problem occurs intermittently. The problem has been waxing and waning since onset. The pain is present in the lumbar spine and thoracic spine. The pain is at a severity of 2/10. The pain is moderate. The symptoms are aggravated by twisting. He has tried analgesics for the symptoms. The treatment provided mild relief.  Diabetes He presents for his follow-up diabetic visit. He has type 2 diabetes mellitus. Pertinent negatives for diabetes include no blurred vision and no foot paresthesias. Symptoms are stable. Risk factors for coronary artery disease include dyslipidemia, diabetes mellitus, hypertension, male sex and sedentary lifestyle. He is following a generally unhealthy diet. His overall blood glucose range is 180-200 mg/dl.      Review of Systems  Eyes:  Negative for blurred vision.  Musculoskeletal:  Positive for back pain.  All other systems reviewed and are negative.      Objective:   Physical Exam Vitals reviewed.  Constitutional:      General: He is not in  acute distress.    Appearance: He is well-developed.  HENT:     Head: Normocephalic.  Eyes:     General:        Right eye: No discharge.        Left eye: No discharge.     Pupils: Pupils are equal, round, and reactive to light.  Neck:     Thyroid: No thyromegaly.  Cardiovascular:     Rate and Rhythm: Normal rate and regular rhythm.     Heart sounds: Normal heart sounds. No murmur heard. Pulmonary:     Effort: Pulmonary effort is normal. No respiratory distress.     Breath sounds: Normal breath sounds. No wheezing.  Abdominal:     General: Bowel sounds are normal. There is no distension.     Palpations: Abdomen is soft.     Tenderness: There is no abdominal tenderness.  Musculoskeletal:        General: No tenderness.     Cervical back: Normal range of motion and neck supple.     Comments: Pain in thoracic and lumbar with flexion and extension  Skin:    General: Skin is warm and dry.     Coloration: Skin is pale.     Findings: No erythema or rash.  Neurological:     Mental Status: He is alert and oriented to person, place, and time.     Cranial Nerves: No cranial nerve deficit.     Motor: Weakness  present.     Gait: Gait abnormal (using rolling walker).     Deep Tendon Reflexes: Reflexes are normal and symmetric.  Psychiatric:        Behavior: Behavior normal.        Thought Content: Thought content normal.        Judgment: Judgment normal.       BP (!) 149/96   Pulse 71   Temp (!) 97 F (36.1 C) (Temporal)   Ht 5\' 10"  (1.778 m)   Wt 174 lb (78.9 kg)   SpO2 99%   BMI 24.97 kg/m      Assessment & Plan:  John Ware comes in today with chief complaint of Hospitalization Follow-up   Diagnosis and orders addressed:  1. Osteomyelitis of other site, unspecified type (HCC)  2. Osteomyelitis of vertebra of thoracolumbar region (HCC)  3. Hospital discharge follow-up  4. Chronic back pain, unspecified back location, unspecified back pain  laterality   Labs drawn from Home Health this week Continue Ancef  Keep all follow up with ID and Neurosurgeon  Follow up if symptoms worsen or do not improve   Jannifer Rodney, FNP

## 2022-12-31 ENCOUNTER — Ambulatory Visit (INDEPENDENT_AMBULATORY_CARE_PROVIDER_SITE_OTHER): Payer: Commercial Managed Care - PPO | Admitting: Infectious Diseases

## 2022-12-31 ENCOUNTER — Other Ambulatory Visit: Payer: Self-pay

## 2022-12-31 ENCOUNTER — Encounter: Payer: Self-pay | Admitting: Infectious Diseases

## 2022-12-31 VITALS — BP 147/98 | HR 79 | Resp 16 | Ht 70.0 in | Wt 174.0 lb

## 2022-12-31 DIAGNOSIS — M4624 Osteomyelitis of vertebra, thoracic region: Secondary | ICD-10-CM | POA: Diagnosis not present

## 2022-12-31 DIAGNOSIS — G062 Extradural and subdural abscess, unspecified: Secondary | ICD-10-CM | POA: Diagnosis not present

## 2022-12-31 DIAGNOSIS — Z5181 Encounter for therapeutic drug level monitoring: Secondary | ICD-10-CM

## 2022-12-31 NOTE — Progress Notes (Signed)
Patient Active Problem List   Diagnosis Date Noted   Intractable back pain 12/04/2022   Medication management 12/04/2022   Vertebral osteomyelitis (HCC) 12/04/2022   Pseudomonas aeruginosa infection 07/07/2022   Osteomyelitis of thoracic region (HCC) 07/03/2022   Osteomyelitis of lumbar spine (HCC) 05/08/2022   Midline low back pain    Numbness    Osteomyelitis (HCC)    Chronic diastolic CHF (congestive heart failure) (HCC) 04/23/2022   Chronic pain 04/23/2022   Osteomyelitis of vertebra of thoracolumbar region (HCC) 04/22/2022   Presence of retained hardware 10/17/2021   Infective myositis 08/29/2021   Discitis of thoracolumbar region 07/31/2021   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   MRSA infection    Diabetic peripheral neuropathy (HCC)    Septic discitis of lumbar region 06/11/2021   Infection of spine (HCC) 05/28/2021   Medication monitoring encounter 05/09/2021   MSSA (methicillin susceptible Staphylococcus aureus) infection 05/09/2021   History of COVID-19    Psoas abscess (HCC)    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   Right shoulder pain 02/14/2021   Normocytic anemia 02/14/2021   Thrombocytosis 02/14/2021   Endocarditis 02/05/2021   Vision changes 02/04/2021   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   Pressure injury of skin 02/01/2021   HTN (hypertension) 02/01/2021   Anxiety 02/01/2021   Left shoulder pain 02/01/2021   Hyponatremia 01/31/2021   Positive colorectal cancer screening using Cologuard test 12/28/2020   DOE (dyspnea on exertion) 10/20/2020   Diabetic polyneuropathy associated with type 2 diabetes mellitus (HCC) 10/09/2020   DM2 (diabetes mellitus, type 2) (HCC) 10/30/2018   Lumbar stenosis with neurogenic claudication 10/14/2017   DDD (degenerative disc disease), lumbar 02/17/2017   Transient weakness of right lower extremity 02/05/2017   Hyperlipidemia associated with type 2 diabetes mellitus (HCC) 10/24/2016   Depression, recurrent (HCC)  10/24/2016   Right knee pain 05/21/2016   DDD (degenerative disc disease), lumbosacral 05/21/2016   Herniation of nucleus pulposus of cervical intervertebral disc without myelopathy 02/29/2016   Carpal tunnel syndrome 02/08/2016   Cervical spondylosis with myelopathy 01/17/2016   Lumbar stenosis 12/13/2014   Current Outpatient Medications on File Prior to Visit  Medication Sig Dispense Refill   acetaminophen (TYLENOL) 500 MG tablet Take 1 tablet (500 mg total) by mouth every 6 (six) hours as needed. 30 tablet 0   Baclofen 5 MG TABS Take 1 tablet (5 mg total) by mouth 3 (three) times daily. 90 tablet 2   ceFAZolin (ANCEF) IVPB Inject 2 g into the vein every 8 (eight) hours. Indication:  MSSE discitis/osteo First Dose: Yes Last Day of Therapy:  01/16/23 Labs - Once weekly:  CBC/D and BMP, Labs - Every other week:  ESR and CRP Method of administration: IV Push Pull PICC line at the completion of IV therapy Method of administration may be changed at the discretion of home infusion pharmacist based upon assessment of the patient and/or caregiver's ability to self-administer the medication ordered. 106 Units 0   Continuous Blood Gluc Sensor (FREESTYLE LIBRE 2 SENSOR) MISC CHECK BLOOD SUGAR 4 TIMES A DAY 6 each 1   doxycycline (VIBRA-TABS) 100 MG tablet Take 1 tablet (100 mg total) by mouth every 12 (twelve) hours. 60 tablet 11   DULoxetine (CYMBALTA) 60 MG capsule Take 1 capsule (60 mg total) by mouth at bedtime. 90 capsule 0   gabapentin (NEURONTIN) 400 MG capsule Take 1 capsule (400 mg total) by mouth 2 (two) times daily. 180 capsule 2   morphine (MS CONTIN) 30 MG 12  hr tablet Take 1 tablet (30 mg) by mouth every 12 hours. 60 tablet 0   naloxone (NARCAN) 0.4 MG/ML injection Inject 1 mL (0.4 mg total) into the vein as needed. 1 mL 0   oxyCODONE-acetaminophen (PERCOCET) 10-325 MG tablet Take 1 tablet by mouth every 6 (six) hours as needed (breakthrough pain). 28 tablet 0   oxyCODONE-acetaminophen  (PERCOCET) 10-325 MG tablet Take 1 tablet by mouth every 6 (six) hours. Stop taking methadone. 28 tablet 0   oxyCODONE-acetaminophen (PERCOCET) 10-325 MG tablet Take 1 tablet by mouth 5 (five) times daily. 150 tablet 0   oxyCODONE-acetaminophen (PERCOCET) 10-325 MG tablet Take 1 tablet by mouth 5 (five) times daily. 150 tablet 0   oxyCODONE-acetaminophen (PERCOCET) 10-325 MG tablet Take 1 tablet by mouth 5 (five) times daily. 150 tablet 0   oxyCODONE-acetaminophen (PERCOCET) 10-325 MG tablet Take 1 tablet by mouth 5 (five) times daily. 150 tablet 0   oxyCODONE-acetaminophen (PERCOCET) 10-325 MG tablet Take 1 tablet by mouth 5 (five) times daily 150 tablet 0   No current facility-administered medications on file prior to visit.   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   Admitted on 04/22/2022 with inability to walk, BLE numbness, intermittent fevers and urinary incontinence x3 weeks.  He was found to have acute discitis T9/10-T11/T12 with epidural phlegmon/abscess, septic arthritis T11-T12, extensive enhancement paraspinal soft tissue, as well as soft tissue abscess. 8/30 S/p fluoroscopy guided T1-11 disc aspiration ( Cx PsA), 8/31 aspiration of left back superficial abscess ( Cx PsA). Blood cx 8/28 1/2 sets staph pseudintermedius/epidermidis. This was followed by  I&D of subcut abscess with decompression and debridement of osteomyelitis via T10-T11 laminectomy on 09/07 by [name] ( OR cx NG). Seen by ID and antibiotics switched to cefepime and planned for 6 weeks course EOT 10/19 with plan to continue Ciprofloxacin thereafter. Patient was also continued on doxycycline for hardware associated lumbar osteomyelitis. Patient was discharged to CIR due to functional decline on 9/12 and then discharged home from rehab on 9/22.   Res-started on ciprofloxacin 750mg  po bid on 11/8 due to concerns of persistent infection in the setting of elevated inflammatory markers and continued to take PO doxycycline as is without any concerns.    Admitted 4/04/29/14 with intractable back pain. 4/9 MRI L spine with discitis, osteomyelitis in lower T and Lumbar spine, epidural phlegmon. 5.9 x 2.0 x 2.0 cm fluid collection spanning the L4-L5 interspinous space, at the site of prior laminectomy, which could reflect a postoperative seroma, although abscess is not excluded.This is unchanged compared to 04/22/22. S/p IR aspiration 4/11,  cx grew MSSE. Discharged on cefazolin for 6 weeks.   12/31/22  Getting iv cefazolin from PICC without any issues. Back pain is 3/10 when on analgesics, and 6-7/10 when not on analgesics. No concerns with PICC. Walks with rolling walker. Discussed about repeat MRI prior to end of abtx to which he agrees.   Review of Systems: all systems reviewed with pertinent  positives and negatives as listed above   Past Medical History:  Diagnosis Date   Depression    Diabetes mellitus without complication (HCC)    Hypertension    Spinal stenosis    With Neurogenic Claudication   Past Surgical History:  Procedure Laterality Date   CARPAL TUNNEL RELEASE Bilateral    INCISION AND DRAINAGE ABSCESS Left 03/12/2021   Procedure: INCISION AND DRAINAGE ABSCESS;  Surgeon: [name], MD;  Location: [facility];  Service: Orthopedics;  Laterality: Left;   IR FLUORO GUIDED  NEEDLE PLC ASPIRATION/INJECTION LOC  04/24/2022   IR FLUORO GUIDED NEEDLE PLC ASPIRATION/INJECTION LOC  12/05/2022   IR US GUIDE BX ASP/DRAIN  02/02/2021   IRRIGATION AND DEBRIDEMENT ABSCESS Left 02/08/2021   Procedure: MINOR INCISION AND DRAINAGE OF ABSCESS;  Surgeon: [name], MD;  Location: [facility];  Service: Orthopedics;  Laterality: Left;   IRRIGATION AND DEBRIDEMENT ABSCESS Left 02/10/2021   Procedure: IRRIGATION AND DEBRIDEMENT ABSCESS LEFT LATERAL CHEST WALL;  Surgeon: [name], MD;  Location: [facility];  Service: General;  Laterality: Left;   IRRIGATION AND DEBRIDEMENT ABSCESS Bilateral 02/20/2021   Procedure: MINOR INCISION AND DRAINAGE OF ABSCESS/WITH BILATERAL WOUND VAC PLACEMENT;  Surgeon: [name], MD;  Location: [facility];  Service: Orthopedics;  Laterality: Bilateral;   IRRIGATION AND DEBRIDEMENT SHOULDER Bilateral 02/28/2021   Procedure: IRRIGATION AND DEBRIDEMENT SHOULDER;  Surgeon: [name], MD;  Location: [facility];  Service: Orthopedics;  Laterality: Bilateral;   IRRIGATION AND DEBRIDEMENT SHOULDER Left 03/06/2021   Procedure: IRRIGATION AND DEBRIDEMENT SHOULDER;  Surgeon: [name], MD;  Location: [facility];  Service: Orthopedics;  Laterality: Left;   IRRIGATION AND DEBRIDEMENT SHOULDER Left 03/08/2021   Procedure: IRRIGATION AND DEBRIDEMENT SHOULDER;  Surgeon: [name], MD;  Location: [facility];  Service: Orthopedics;  Laterality: Left;   LUMBAR LAMINECTOMY FOR EPIDURAL ABSCESS N/A 05/02/2022   Procedure: THORACIC  LAMINECTOMY AND DEBRIDEMENT  FOR EPIDURAL ABSCESS;  Surgeon: [name], MD;  Location: [facility];  Service: Neurosurgery;  Laterality: N/A;   LUMBAR LAMINECTOMY/DECOMPRESSION MICRODISCECTOMY N/A 12/13/2014   Procedure: LUMBAR THREE-FOUR, LUMBAR FOUR-FIVE, LUMBAR FIVE-SACRAL ONE LAMINECTOMY;  Surgeon: [name], MD;  Location: [facility];  Service: Neurosurgery;  Laterality: N/A;  L3-4 L4-5 L5-S1 Laminectomy   LUMBAR LAMINECTOMY/DECOMPRESSION  MICRODISCECTOMY Left 06/05/2021   Procedure: Left lumbar one-two, Lumbar two-three Laminectomy;  Surgeon: [name], MD;  Location: [facility] OR;  Service: Neurosurgery;  Laterality: Left;   MENISCUS REPAIR     Right knee   MINOR APPLICATION OF WOUND VAC Left 02/20/2021   Procedure: MINOR APPLICATION OF KERLIX PACKING/REMOVAL OF WOUND VAC;  Surgeon: [name], MD;  Location: [facility];  Service: General;  Laterality: Left;   TEE WITHOUT CARDIOVERSION N/A 02/05/2021   Procedure: TRANSESOPHAGEAL ECHOCARDIOGRAM (TEE);  Surgeon: Quintella Reichert, MD;  Location[facility];  Service: Cardiovascular;  Laterality: N/A;   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   TOTAL SHOULDER ARTHROPLASTY Right 02/10/2021   Procedure: RIGHT OPEN SHOULDER I & D, OPEN DISTAL CLAVICLE RESECTION;  Surgeon: [name], MD;  Location: [facility];  Service: Orthopedics;  Laterality: Right;   VASECTOMY       Social History   Tobacco Use   Smoking status: Former    Packs/day: 2.00    Years: 15.00    Additional pack years: 0.00    Total pack years: 30.00    Types: Cigarettes    Quit date: 2012    Years  since quitting: 12.3   Smokeless tobacco: Never  Vaping Use   Vaping Use: Never used  Substance Use Topics   Alcohol use: Yes    Comment: occasional   Drug use: No    Family History  Problem Relation Age of Onset   Cancer Mother    Colon cancer Neg Hx     Allergies  Allergen Reactions   Tizanidine Other (See Comments)    Leg cramps    Health Maintenance  Topic Date Due   Zoster Vaccines- Shingrix (1 of 2) Never done   COVID-19 Vaccine (3 - Moderna risk series) 02/14/2020   FOOT EXAM  11/17/2022   OPHTHALMOLOGY EXAM  11/17/2022   HEMOGLOBIN A1C  02/15/2023   INFLUENZA VACCINE  03/27/2023   Diabetic kidney evaluation - Urine ACR  08/17/2023   Fecal DNA (Cologuard)  11/03/2023   Lung Cancer Screening  12/03/2023   Diabetic kidney evaluation - eGFR measurement  12/09/2023   DTaP/Tdap/Td  (2 - Td or Tdap) 08/14/2024   Hepatitis C Screening  Completed   HIV Screening  Completed   HPV VACCINES  Aged Out    Objective: BP (!) 147/98   Pulse 79   Resp 16   Ht 5\' 10"  (1.778 m)   Wt 174 lb (78.9 kg)   SpO2 100%   BMI 24.97 kg/m    Physical Exam Constitutional:  older than stated age, malnourished   HENT:     Head: Normocephalic and atraumatic.      Mouth: Mucous membranes are moist.  Eyes:    Conjunctiva/sclera: Conjunctivae normal.     Pupils:   Cardiovascular:     Rate and Rhythm: Normal rate and regular rhythm.     Heart sounds:  Pulmonary:     Effort: Pulmonary effort is normal.     Breath sounds: Normal breath sounds.   Abdominal:     General: Non distended     Palpations: soft.   Musculoskeletal:        General: ambulatory with cane but not steady gait.  Power of Rt LE 4 to 4+/5 Power of Lt LE 4 to 4+/5  Skin:    General: Skin is warm and dry.     Comments:   Neurological:     General: grossly non focal     Mental Status: awake, alert and oriented to person, place, and time.   Psychiatric:        Mood and Affect: Mood normal.   Lab Results Lab Results  Component Value Date   WBC 8.7 12/09/2022   HGB 11.5 (L) 12/09/2022   HCT 33.3 (L) 12/09/2022   MCV 83.5 12/09/2022   PLT 399 12/09/2022    Lab Results  Component Value Date   CREATININE 0.59 (L) 12/09/2022   BUN 7 12/09/2022   NA 136 12/09/2022   K 3.8 12/09/2022   CL 99 12/09/2022   CO2 29 12/09/2022    Lab Results  Component Value Date   ALT 18 12/03/2022   AST 24 12/03/2022   ALKPHOS 123 12/03/2022   BILITOT 0.5 12/03/2022    Lab Results  Component Value Date   CHOL 146 11/08/2021   HDL 43 11/08/2021   LDLCALC 85 11/08/2021   TRIG 97 11/08/2021   CHOLHDL 3.4 11/08/2021   No results found for: "LABRPR", "RPRTITER" No results found for: "HIV1RNAQUANT", "HIV1RNAVL", "CD4TABS"   Microbiology  Results for orders placed or performed during the hospital encounter of  12/03/22  Blood culture (  routine x 2)     Status: None   Collection Time: 12/03/22 10:41 AM   Specimen: BLOOD LEFT ARM  Result Value Ref Range Status   Specimen Description BLOOD LEFT ARM  Final   Special Requests   Final    BOTTLES DRAWN AEROBIC AND ANAEROBIC Blood Culture adequate volume   Culture   Final    NO GROWTH 5 DAYS Performed at [facility], [address] [place], [place] [address]    Report Status 12/08/2022 FINAL  Final  Blood culture (routine x 2)     Status: None   Collection Time: 12/03/22 10:47 AM   Specimen: BLOOD RIGHT ARM  Result Value Ref Range Status   Specimen Description BLOOD RIGHT ARM  Final   Special Requests   Final    BOTTLES DRAWN AEROBIC AND ANAEROBIC Blood Culture adequate volume   Culture   Final    NO GROWTH 5 DAYS Performed at [facility], [address], [place], [place] [address]    Report Status 12/08/2022 FINAL  Final  Surgical PCR screen     Status: None   Collection Time: 12/03/22  5:10 PM   Specimen: Nasal Mucosa; Nasal Swab  Result Value Ref Range Status   MRSA, PCR NEGATIVE NEGATIVE Final   Staphylococcus aureus NEGATIVE NEGATIVE Final    Comment: (NOTE) The Xpert SA Assay (FDA approved for NASAL specimens in patients 2 years of age and older), is one component of a comprehensive surveillance program. It is not intended to diagnose infection nor to guide or monitor treatment. Performed at [facility], [address], [place], [place] [address]   Aerobic/Anaerobic Culture w Gram Stain (surgical/deep wound)     Status: None   Collection Time: 12/05/22 11:42 AM   Specimen: PATH Disc; Tissue  Result Value Ref Range Status   Specimen Description OTHER  Final   Special Requests  DISC  Final   Gram Stain NO WBC SEEN NO ORGANISMS SEEN   Final   Culture   Final    RARE STAPHYLOCOCCUS EPIDERMIDIS NO ANAEROBES ISOLATED Performed at [facility], [address] [place], [place] [address]    Report  Status 12/10/2022 FINAL  Final   Organism ID, Bacteria STAPHYLOCOCCUS EPIDERMIDIS  Final      Susceptibility   Staphylococcus epidermidis - MIC*    CIPROFLOXACIN 4 RESISTANT Resistant     ERYTHROMYCIN >=8 RESISTANT Resistant     GENTAMICIN <=0.5 SENSITIVE Sensitive     OXACILLIN <=0.25 SENSITIVE Sensitive     TETRACYCLINE >=16 RESISTANT Resistant     VANCOMYCIN 2 SENSITIVE Sensitive     TRIMETH/SULFA 160 RESISTANT Resistant     CLINDAMYCIN >=8 RESISTANT Resistant     RIFAMPIN <=0.5 SENSITIVE Sensitive     Inducible Clindamycin NEGATIVE Sensitive     * RARE STAPHYLOCOCCUS EPIDERMIDIS   Imaging  Assessment/Plan  # Persistent thoracic discitis/osteomyelitis with epidural phlegmon as well as 5.9 x 2 x 2 fluid collection at the L4-L5 interspinous space (post op seroma vs abscess, unchanged compared to 04/22/2022) - complete 6 weeks of IV cefazolin as in OPAT, will plan for PO abtx like cefadroxil thereafter  - repeat MRI L spine prior to EOT( 5/23) to fu on epidural phlegmon  - FU after MRI L spine    # Medication Monitoring  - 4/30 CBC and BMP reviewed.  ESR 79, CRP 60  # PICC  - no concerns   # Lumbar  Osteomyelitis complicated with hardware Continue po doxycycline indefinitely  I have personally spent 43 minutes involved in face-to-face and non-face-to-face activities for this patient on the day of the visit. Professional time spent includes the following activities: Preparing to see the patient (review of tests), Obtaining and/or reviewing separately obtained history (admission/discharge record), Performing a medically appropriate examination and/or evaluation , Ordering medications/tests/procedures, referring and communicating with other health care professionals, Documenting clinical information in the EMR, Independently interpreting results (not separately reported), Communicating results to the patient/family/caregiver, Counseling and educating the patient/family/caregiver and Care  coordination (not separately reported).   Victoriano Lain, MD [facility] 12/31/2022, 10:15 AM

## 2023-01-01 ENCOUNTER — Other Ambulatory Visit: Payer: Self-pay | Admitting: Infectious Diseases

## 2023-01-01 MED ORDER — LORAZEPAM 1 MG PO TABS: 1.0000 mg | ORAL_TABLET | Freq: Once | ORAL | 0 refills | Status: DC

## 2023-01-01 MED ORDER — LORAZEPAM 1 MG PO TABS: 1.0000 mg | ORAL_TABLET | Freq: Once | ORAL | 0 refills | Status: AC

## 2023-01-03 ENCOUNTER — Encounter: Payer: Self-pay | Admitting: Infectious Diseases

## 2023-01-05 ENCOUNTER — Ambulatory Visit (HOSPITAL_COMMUNITY): Payer: Commercial Managed Care - PPO

## 2023-01-06 NOTE — Telephone Encounter (Signed)
Called [facility] with order to extend patient Iv antibiotics until he seen by MD. Sherron Monday with Cassie, Pharmacist who was able to take order.  Patient is aware of plan. Juanita Laster, RMA

## 2023-01-08 ENCOUNTER — Other Ambulatory Visit (HOSPITAL_COMMUNITY): Payer: Self-pay

## 2023-01-08 MED ORDER — OXYCODONE-ACETAMINOPHEN 10-325 MG PO TABS
ORAL_TABLET | ORAL | 0 refills | Status: DC
  Filled 2023-01-09: qty 150, 30d supply, fill #0

## 2023-01-08 MED ORDER — MORPHINE SULFATE ER 30 MG PO TBCR
EXTENDED_RELEASE_TABLET | ORAL | 0 refills | Status: DC
  Filled 2023-01-09: qty 60, 30d supply, fill #0

## 2023-01-09 ENCOUNTER — Other Ambulatory Visit (HOSPITAL_COMMUNITY): Payer: Self-pay

## 2023-01-22 ENCOUNTER — Ambulatory Visit (HOSPITAL_COMMUNITY)
Admission: RE | Admit: 2023-01-22 | Discharge: 2023-01-22 | Disposition: A | Discharge status: Home / Self Care | Payer: Commercial Managed Care - PPO | Source: Ambulatory Visit | Attending: Infectious Diseases | Admitting: Infectious Diseases

## 2023-01-22 ENCOUNTER — Encounter: Payer: Self-pay | Admitting: Infectious Diseases

## 2023-01-22 DIAGNOSIS — G062 Extradural and subdural abscess, unspecified: Secondary | ICD-10-CM | POA: Diagnosis present

## 2023-01-22 MED ORDER — GADOBUTROL 1 MMOL/ML IV SOLN
10.0000 mL | Freq: Once | INTRAVENOUS | Status: AC | PRN
  Administered 2023-01-22: 10 mL via INTRAVENOUS

## 2023-01-28 ENCOUNTER — Ambulatory Visit: Payer: Commercial Managed Care - PPO | Admitting: Infectious Diseases

## 2023-01-28 ENCOUNTER — Telehealth: Payer: Self-pay

## 2023-01-28 ENCOUNTER — Encounter: Payer: Self-pay | Admitting: Infectious Diseases

## 2023-01-28 NOTE — Progress Notes (Signed)
[facility] radiology regarding results of MRI spine ( pending) to update at the earliest. Appt rescheduled to 6/6, continue IV cefazolin till next visit. Patient has been informed.

## 2023-01-28 NOTE — Telephone Encounter (Signed)
Per MD called patient to reschedule appointment for later this week while we wait on MRI report.  Patient scheduled for video visit on 6/6.  Per provider extended patient IV antibiotics until appointment. Orders relayed to Amy with [facility]. Juanita Laster, RMA

## 2023-01-30 ENCOUNTER — Other Ambulatory Visit: Payer: Self-pay

## 2023-01-30 ENCOUNTER — Telehealth (INDEPENDENT_AMBULATORY_CARE_PROVIDER_SITE_OTHER): Payer: Self-pay | Admitting: Infectious Diseases

## 2023-01-30 ENCOUNTER — Telehealth: Payer: Self-pay

## 2023-01-30 DIAGNOSIS — M4624 Osteomyelitis of vertebra, thoracic region: Secondary | ICD-10-CM

## 2023-01-30 DIAGNOSIS — Z452 Encounter for adjustment and management of vascular access device: Secondary | ICD-10-CM

## 2023-01-30 DIAGNOSIS — G8929 Other chronic pain: Secondary | ICD-10-CM

## 2023-01-30 DIAGNOSIS — Z79899 Other long term (current) drug therapy: Secondary | ICD-10-CM

## 2023-01-30 DIAGNOSIS — Z5181 Encounter for therapeutic drug level monitoring: Secondary | ICD-10-CM

## 2023-01-30 DIAGNOSIS — M4804 Spinal stenosis, thoracic region: Secondary | ICD-10-CM

## 2023-01-30 MED ORDER — CEFADROXIL 500 MG PO CAPS: 1000.0000 mg | ORAL_CAPSULE | Freq: Two times a day (BID) | ORAL | 2 refills | Status: DC

## 2023-01-30 NOTE — Telephone Encounter (Signed)
  Per provider  ok to PULL PICC after End Date. 01/30/2023  Provider:Dr Manandhar End Date:01/30/23   Notified [facility] and [facility] to contact Home Health Nurse.

## 2023-01-30 NOTE — Progress Notes (Signed)
Virtual Visit via Video Note  I connected with NAME@ on 01/30/23 at  2:45 PM EDT by a video enabled telemedicine application and verified that I am speaking with the correct person using two identifiers.  Location: Patient: Home  Provider: [facility]   I discussed the limitations of evaluation and management by telemedicine and the availability of in person appointments. The patient expressed understanding and agreed to proceed.  [facility]  Patient Active Problem List   Diagnosis Date Noted   Intractable back pain 12/04/2022   Medication management 12/04/2022   Vertebral osteomyelitis (HCC) 12/04/2022   Pseudomonas aeruginosa infection 07/07/2022   Osteomyelitis of thoracic region (HCC) 07/03/2022   Osteomyelitis of lumbar spine (HCC) 05/08/2022   Midline low back pain    Numbness    Osteomyelitis (HCC)    Chronic diastolic CHF (congestive heart failure) (HCC) 04/23/2022   Chronic pain 04/23/2022   Osteomyelitis of vertebra of thoracolumbar region (HCC) 04/22/2022   Presence of retained hardware 10/17/2021   Infective myositis 08/29/2021   Discitis of thoracolumbar region 07/31/2021   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   MRSA infection    Diabetic peripheral neuropathy (HCC)    Septic discitis of lumbar region 06/11/2021   Infection of spine (HCC) 05/28/2021   Medication monitoring encounter 05/09/2021   MSSA (methicillin susceptible Staphylococcus aureus) infection 05/09/2021   History of COVID-19    Psoas abscess (HCC)    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   Right shoulder pain 02/14/2021   Normocytic anemia 02/14/2021   Thrombocytosis 02/14/2021   Endocarditis 02/05/2021   Vision changes 02/04/2021   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   Pressure injury of skin 02/01/2021   HTN (hypertension) 02/01/2021   Anxiety 02/01/2021   Left shoulder pain 02/01/2021   Hyponatremia 01/31/2021   Positive colorectal cancer screening using Cologuard  test 12/28/2020   DOE (dyspnea on exertion) 10/20/2020   Diabetic polyneuropathy associated with type 2 diabetes mellitus (HCC) 10/09/2020   DM2 (diabetes mellitus, type 2) (HCC) 10/30/2018   Lumbar stenosis with neurogenic claudication 10/14/2017   DDD (degenerative disc disease), lumbar 02/17/2017   Transient weakness of right lower extremity 02/05/2017   Hyperlipidemia associated with type 2 diabetes mellitus (HCC) 10/24/2016   Depression, recurrent (HCC) 10/24/2016   Right knee pain 05/21/2016   DDD (degenerative disc disease), lumbosacral 05/21/2016   Herniation of nucleus pulposus of cervical intervertebral disc without myelopathy 02/29/2016   Carpal tunnel syndrome 02/08/2016   Cervical spondylosis with myelopathy 01/17/2016   Lumbar stenosis 12/13/2014    Patient's Medications  New Prescriptions   No medications on file  Previous Medications   ACETAMINOPHEN (TYLENOL) 500 MG TABLET    Take 1 tablet (500 mg total) by mouth every 6 (six) hours as needed.   BACLOFEN 5 MG TABS    Take 1 tablet (5 mg total) by mouth 3 (three) times daily.   CONTINUOUS BLOOD GLUC SENSOR (FREESTYLE LIBRE 2 SENSOR) MISC    CHECK BLOOD SUGAR 4 TIMES A DAY   DOXYCYCLINE (VIBRA-TABS) 100 MG TABLET    Take 1 tablet (100 mg total) by mouth every 12 (twelve) hours.   DULOXETINE (CYMBALTA) 60 MG CAPSULE    Take 1 capsule (60 mg total) by mouth at bedtime.   GABAPENTIN (NEURONTIN) 400 MG CAPSULE    Take 1 capsule (400 mg total) by mouth 2 (two) times daily.   MORPHINE (MS CONTIN) 30 MG 12 HR TABLET    Take 1 tablet by mouth every  twelve hours   NALOXONE (NARCAN) 0.4 MG/ML INJECTION    Inject 1 mL (0.4 mg total) into the vein as needed.   OXYCODONE-ACETAMINOPHEN (PERCOCET) 10-325 MG TABLET    Take 1 tablet by mouth every 6 (six) hours as needed (breakthrough pain).   OXYCODONE-ACETAMINOPHEN (PERCOCET) 10-325 MG TABLET    Take 1 tablet by mouth every 6 (six) hours. Stop taking methadone.   OXYCODONE-ACETAMINOPHEN  (PERCOCET) 10-325 MG TABLET    Take 1 tablet by mouth 5 (five) times daily.   OXYCODONE-ACETAMINOPHEN (PERCOCET) 10-325 MG TABLET    Take 1 tablet by mouth 5 (five) times daily.   OXYCODONE-ACETAMINOPHEN (PERCOCET) 10-325 MG TABLET    Take 1 tablet by mouth 5 (five) times daily.   OXYCODONE-ACETAMINOPHEN (PERCOCET) 10-325 MG TABLET    Take 1 tablet by mouth 5 (five) times daily.   OXYCODONE-ACETAMINOPHEN (PERCOCET) 10-325 MG TABLET    Take 1 tablet by mouth five times a day  Modified Medications   No medications on file  Discontinued Medications   No medications on file    History of Present Illness: Please see prior note on 5/7 for details.  Here for follow up after last MRI T spine. Started with video but had to switch back to phone visit due to sound issues. Discussed MRI T findings as well as last blood work on 6/6 - down trending inflammatory markers.  Getting IV cefazolin through picc. Reports back pain is worse taking MS contin twice a day and percocet as needed. Thinking to ask pain doctor to up on the dose of analgesics. He has an upcoming fu with Dr Danielle Dess in July to reviewed the MRI T spine. He is calling his office to schedule sooner. Denies any concerns with PICC or abtx.   ROS Denies fevers, chills. Denies nausea, vomiting and diarrhea.   Past Medical History:  Diagnosis Date   Depression    Diabetes mellitus without complication (HCC)    Hypertension    Spinal stenosis    With Neurogenic Claudication    Social History   Tobacco Use   Smoking status: Former    Packs/day: 2.00    Years: 15.00    Additional pack years: 0.00    Total pack years: 30.00    Types: Cigarettes    Quit date: 2012    Years since quitting: 12.4    Passive exposure: Never   Smokeless tobacco: Never  Vaping Use   Vaping Use: Never used  Substance Use Topics   Alcohol use: Yes    Comment: occasional   Drug use: No    Family History  Problem Relation Age of Onset   Cancer Mother     Colon cancer Neg Hx     Allergies  Allergen Reactions   Tizanidine Other (See Comments)    Leg cramps    Health Maintenance  Topic Date Due   Zoster Vaccines- Shingrix (1 of 2) Never done   COVID-19 Vaccine (3 - Moderna risk series) 02/14/2020   FOOT EXAM  11/17/2022   OPHTHALMOLOGY EXAM  11/17/2022   HEMOGLOBIN A1C  02/15/2023   INFLUENZA VACCINE  03/27/2023   Diabetic kidney evaluation - Urine ACR  08/17/2023   Fecal DNA (Cologuard)  11/03/2023   Lung Cancer Screening  12/03/2023   Diabetic kidney evaluation - eGFR measurement  12/09/2023   DTaP/Tdap/Td (2 - Td or Tdap) 08/14/2024   Hepatitis C Screening  Completed   HIV Screening  Completed   HPV VACCINES  Aged  Out   Radiology  MR Lumbar Spine W Wo Contrast  Result Date: 01/28/2023 CLINICAL DATA:  Follow-up epidural phlegmon/abscess. EXAM: MRI LUMBAR SPINE WITHOUT AND WITH CONTRAST TECHNIQUE: Multiplanar and multiecho pulse sequences of the lumbar spine were obtained without and with intravenous contrast. CONTRAST:  10mL GADAVIST GADOBUTROL 1 MMOL/ML IV SOLN COMPARISON:  Marked spine MRI 12/03/2022. FINDINGS: Segmentation: There is partial lumbarization of the S1 vertebral body. The lowest rudimentary disc space is designated S1-S2. Alignment: There is unchanged fixed grade 1 anterolisthesis of L5 on S1 and mild dextrocurvature. Vertebrae: There is T1 hypointensity with STIR hyperintensity and edema in the T11 and T12 vertebral bodies along the disc space consistent with discitis/osteomyelitis. There is also discitis/osteomyelitis at T10-T11, though the T10 vertebral body is not included within the field of view. The appearance is overall similar to the prior MRI from 12/03/2022. There is enhancing phlegmon along the ventral epidural space at T11-T12 measuring up 2 5 mm in thickness. Additional phlegmon along the dorsal epidural space measuring 5 mm at T12-L1 is also noted. Postcontrast imaging was not obtained on the previous study  from 12/03/2022; however, the phlegmon appears increased in thickness when comparing the sagittal T2 sequences. The dorsal phlegmon at T12-L1 is increased in size compared to the prior postcontrast imaging from 04/22/2022. The endplate irregularity at T11-T12 also is progressed since that study. Anterior wedge deformity of the L2 vertebral body is unchanged. There is STIR hyperintensity within the L2-L3 disc space which appears similar to the prior study without convincing marrow edema or enhancement in the adjacent vertebral bodies to suggest infection at this level. Postsurgical changes reflecting L3 through S1 posterior fusion are again seen. Conus medullaris and cauda equina: Conus extends to the L1 level. There is a 9 mm TV x 5 mm AP T2 hyperintense fluid collection in the ventral epidural space at the L1 level. A collection was present at this level on the prior study but was located more to the right. Additionally, the collection is increased in size in the craniocaudal dimension since the prior study. There is no convincing peripheral enhancement. There is no abnormal enhancement of the conus medullaris or cauda equina nerve roots. Paraspinal and other soft tissues: A T2 hyperintense fluid collection is again noted in the laminectomy bed without evidence of peripheral enhancement. There is edema in the paraspinal soft tissues at T10 through T12. There is no evidence of paraspinal abscess. Disc levels: There is persistent severe spinal canal stenosis at T10-T11 through T12-L1 related to the above-described epidural phlegmon. Multilevel degenerative changes throughout the remainder of the lumbar spine are stable since the prior study with multilevel neural foraminal stenosis but no other high-grade spinal canal stenosis. IMPRESSION: 1. Persistent discitis/osteomyelitis at T10 through T12 with associated epidural phlegmon extending to the T12-L1 level resulting in persistent severe spinal canal stenosis at  these levels. The phlegmon appears increased in thickness since the prior study; however, note that postcontrast imaging was not obtained on the prior study. The dorsal phlegmon at T12-L1 is increased in thickness compared to the prior postcontrast imaging from 04/22/2022. Endplate irregularity also appears increased since the study from 2023. 2. Fluid collection in the ventral epidural space at L1 appears increased in size but without peripheral enhancement to suggest organized abscess. 3. No convincing evidence of osteomyelitis elsewhere in the lumbar spine. 4. Stable fluid collection in the laminectomy bed without evidence of peripheral enhancement. Electronically Signed   By: Lesia Hausen M.D.   On: 01/28/2023  14:09    Observations/Objective: Able to speak in full sentences without any discomfort.   Assessment and Plan: # Persistent thoracic discitis and osteomyelitis w associated phlegmon extending to L1 level resulting in severe spinal canal stenosis # Chronic back pain  DC picc line and IV cefazolin today Start cefadroxil 1g po bid from tomorrow until next visit, duration TBD depending on clinical progress  Continue doxycyline indefinitely for lumbar OM associated with hardware Recent labs discussed Will coordinate with [facility] for picc removal  Fu with Neurosurgery and pain clinic  Follow Up Instructions: Fu in a month    I discussed the assessment and treatment plan with the patient. The patient was provided an opportunity to ask questions and all were answered. The patient agreed with the plan and demonstrated an understanding of the instructions.   The patient was advised to call back or seek an in-person evaluation if the symptoms worsen or if the condition fails to improve as anticipated.  I provided 30  minutes of non-face-to-face time during this encounter.  Victoriano Lain, MD [facility] [phone] pager   [phone]  cell 01/30/2023, 2:30 PM

## 2023-02-05 ENCOUNTER — Other Ambulatory Visit (HOSPITAL_COMMUNITY): Payer: Self-pay

## 2023-02-05 MED ORDER — MORPHINE SULFATE ER 30 MG PO CP24
30.0000 mg | ORAL_CAPSULE | Freq: Two times a day (BID) | ORAL | 0 refills | Status: DC
  Filled 2023-02-05: qty 60, 30d supply, fill #0

## 2023-02-05 MED ORDER — OXYCODONE-ACETAMINOPHEN 10-325 MG PO TABS
1.0000 | ORAL_TABLET | Freq: Every day | ORAL | 0 refills | Status: DC
  Filled 2023-02-06: qty 150, 30d supply, fill #0

## 2023-02-05 MED ORDER — GABAPENTIN (ONCE-DAILY) 600 MG PO TABS
600.0000 mg | ORAL_TABLET | Freq: Every day | ORAL | 0 refills | Status: DC
  Filled 2023-02-05: qty 30, 30d supply, fill #0

## 2023-02-05 MED ORDER — BACLOFEN 5 MG PO TABS
5.0000 mg | ORAL_TABLET | Freq: Three times a day (TID) | ORAL | 2 refills | Status: DC
  Filled 2023-02-05: qty 90, 30d supply, fill #0

## 2023-02-05 MED ORDER — HYDROXYZINE HCL 10 MG PO TABS
10.0000 mg | ORAL_TABLET | Freq: Three times a day (TID) | ORAL | 2 refills | Status: DC
  Filled 2023-02-05: qty 90, 30d supply, fill #0

## 2023-02-06 ENCOUNTER — Other Ambulatory Visit (HOSPITAL_COMMUNITY): Payer: Self-pay

## 2023-02-13 ENCOUNTER — Other Ambulatory Visit (HOSPITAL_COMMUNITY): Payer: Self-pay

## 2023-02-14 ENCOUNTER — Ambulatory Visit: Payer: Commercial Managed Care - PPO | Admitting: Family

## 2023-02-17 ENCOUNTER — Encounter: Payer: Self-pay | Admitting: Family

## 2023-02-21 ENCOUNTER — Other Ambulatory Visit: Payer: Self-pay | Admitting: Neurological Surgery

## 2023-02-21 DIAGNOSIS — M4620 Osteomyelitis of vertebra, site unspecified: Secondary | ICD-10-CM

## 2023-02-22 ENCOUNTER — Emergency Department (HOSPITAL_COMMUNITY): Payer: Medicare Other

## 2023-02-22 ENCOUNTER — Other Ambulatory Visit: Payer: Self-pay

## 2023-02-22 ENCOUNTER — Inpatient Hospital Stay (HOSPITAL_COMMUNITY)
Admission: EM | Admit: 2023-02-22 | Discharge: 2023-03-06 | DRG: 478 | Disposition: A | Discharge status: Home Health | Payer: Medicare Other | Source: Ambulatory Visit | Attending: Internal Medicine | Admitting: Internal Medicine

## 2023-02-22 ENCOUNTER — Inpatient Hospital Stay (HOSPITAL_COMMUNITY): Payer: Medicare Other

## 2023-02-22 ENCOUNTER — Encounter (HOSPITAL_COMMUNITY): Payer: Self-pay

## 2023-02-22 ENCOUNTER — Ambulatory Visit (HOSPITAL_COMMUNITY)
Admission: RE | Admit: 2023-02-22 | Discharge: 2023-02-22 | Disposition: A | Discharge status: Home / Self Care | Payer: Commercial Managed Care - PPO | Source: Ambulatory Visit | Attending: Neurological Surgery | Admitting: Neurological Surgery

## 2023-02-22 DIAGNOSIS — Z79899 Other long term (current) drug therapy: Secondary | ICD-10-CM

## 2023-02-22 DIAGNOSIS — Z792 Long term (current) use of antibiotics: Secondary | ICD-10-CM | POA: Diagnosis not present

## 2023-02-22 DIAGNOSIS — M4624 Osteomyelitis of vertebra, thoracic region: Secondary | ICD-10-CM | POA: Diagnosis present

## 2023-02-22 DIAGNOSIS — Z888 Allergy status to other drugs, medicaments and biological substances status: Secondary | ICD-10-CM

## 2023-02-22 DIAGNOSIS — Z87891 Personal history of nicotine dependence: Secondary | ICD-10-CM | POA: Diagnosis not present

## 2023-02-22 DIAGNOSIS — M4620 Osteomyelitis of vertebra, site unspecified: Secondary | ICD-10-CM | POA: Diagnosis not present

## 2023-02-22 DIAGNOSIS — M4625 Osteomyelitis of vertebra, thoracolumbar region: Secondary | ICD-10-CM | POA: Diagnosis not present

## 2023-02-22 DIAGNOSIS — E785 Hyperlipidemia, unspecified: Secondary | ICD-10-CM | POA: Diagnosis not present

## 2023-02-22 DIAGNOSIS — M4626 Osteomyelitis of vertebra, lumbar region: Secondary | ICD-10-CM | POA: Diagnosis present

## 2023-02-22 DIAGNOSIS — E1169 Type 2 diabetes mellitus with other specified complication: Secondary | ICD-10-CM | POA: Diagnosis present

## 2023-02-22 DIAGNOSIS — Z8614 Personal history of Methicillin resistant Staphylococcus aureus infection: Secondary | ICD-10-CM | POA: Diagnosis not present

## 2023-02-22 DIAGNOSIS — I10 Essential (primary) hypertension: Secondary | ICD-10-CM | POA: Diagnosis not present

## 2023-02-22 DIAGNOSIS — T847XXA Infection and inflammatory reaction due to other internal orthopedic prosthetic devices, implants and grafts, initial encounter: Principal | ICD-10-CM | POA: Diagnosis present

## 2023-02-22 DIAGNOSIS — M48062 Spinal stenosis, lumbar region with neurogenic claudication: Secondary | ICD-10-CM | POA: Diagnosis present

## 2023-02-22 DIAGNOSIS — M4649 Discitis, unspecified, multiple sites in spine: Secondary | ICD-10-CM | POA: Diagnosis present

## 2023-02-22 DIAGNOSIS — M4644 Discitis, unspecified, thoracic region: Secondary | ICD-10-CM | POA: Diagnosis not present

## 2023-02-22 DIAGNOSIS — M549 Dorsalgia, unspecified: Secondary | ICD-10-CM | POA: Diagnosis not present

## 2023-02-22 DIAGNOSIS — B965 Pseudomonas (aeruginosa) (mallei) (pseudomallei) as the cause of diseases classified elsewhere: Secondary | ICD-10-CM | POA: Diagnosis not present

## 2023-02-22 DIAGNOSIS — E1165 Type 2 diabetes mellitus with hyperglycemia: Secondary | ICD-10-CM | POA: Diagnosis not present

## 2023-02-22 DIAGNOSIS — Y831 Surgical operation with implant of artificial internal device as the cause of abnormal reaction of the patient, or of later complication, without mention of misadventure at the time of the procedure: Secondary | ICD-10-CM | POA: Diagnosis present

## 2023-02-22 DIAGNOSIS — D75839 Thrombocytosis, unspecified: Secondary | ICD-10-CM | POA: Diagnosis present

## 2023-02-22 DIAGNOSIS — B9561 Methicillin susceptible Staphylococcus aureus infection as the cause of diseases classified elsewhere: Secondary | ICD-10-CM | POA: Diagnosis present

## 2023-02-22 DIAGNOSIS — Z96611 Presence of right artificial shoulder joint: Secondary | ICD-10-CM | POA: Diagnosis present

## 2023-02-22 DIAGNOSIS — E876 Hypokalemia: Secondary | ICD-10-CM | POA: Diagnosis not present

## 2023-02-22 DIAGNOSIS — M4804 Spinal stenosis, thoracic region: Secondary | ICD-10-CM | POA: Diagnosis not present

## 2023-02-22 DIAGNOSIS — E1142 Type 2 diabetes mellitus with diabetic polyneuropathy: Secondary | ICD-10-CM

## 2023-02-22 DIAGNOSIS — I110 Hypertensive heart disease with heart failure: Secondary | ICD-10-CM | POA: Diagnosis not present

## 2023-02-22 DIAGNOSIS — M4635 Infection of intervertebral disc (pyogenic), thoracolumbar region: Secondary | ICD-10-CM | POA: Diagnosis present

## 2023-02-22 DIAGNOSIS — E119 Type 2 diabetes mellitus without complications: Secondary | ICD-10-CM | POA: Diagnosis not present

## 2023-02-22 DIAGNOSIS — Z79891 Long term (current) use of opiate analgesic: Secondary | ICD-10-CM | POA: Diagnosis not present

## 2023-02-22 DIAGNOSIS — G834 Cauda equina syndrome: Secondary | ICD-10-CM | POA: Diagnosis not present

## 2023-02-22 DIAGNOSIS — I5032 Chronic diastolic (congestive) heart failure: Secondary | ICD-10-CM | POA: Diagnosis not present

## 2023-02-22 DIAGNOSIS — M8660 Other chronic osteomyelitis, unspecified site: Secondary | ICD-10-CM

## 2023-02-22 DIAGNOSIS — M4645 Discitis, unspecified, thoracolumbar region: Principal | ICD-10-CM

## 2023-02-22 LAB — CBC WITH DIFFERENTIAL/PLATELET
Abs Immature Granulocytes: 0.02 10*3/uL (ref 0.00–0.07)
Basophils Absolute: 0 10*3/uL (ref 0.0–0.1)
Basophils Relative: 1 %
Eosinophils Absolute: 0.1 10*3/uL (ref 0.0–0.5)
Eosinophils Relative: 1 %
HCT: 42.5 % (ref 39.0–52.0)
Hemoglobin: 13.7 g/dL (ref 13.0–17.0)
Immature Granulocytes: 0 %
Lymphocytes Relative: 27 %
Lymphs Abs: 2.2 10*3/uL (ref 0.7–4.0)
MCH: 27.7 pg (ref 26.0–34.0)
MCHC: 32.2 g/dL (ref 30.0–36.0)
MCV: 85.9 fL (ref 80.0–100.0)
Monocytes Absolute: 0.5 10*3/uL (ref 0.1–1.0)
Monocytes Relative: 6 %
Neutro Abs: 5.4 10*3/uL (ref 1.7–7.7)
Neutrophils Relative %: 65 %
Platelets: 478 10*3/uL — ABNORMAL HIGH (ref 150–400)
RBC: 4.95 MIL/uL (ref 4.22–5.81)
RDW: 15.8 % — ABNORMAL HIGH (ref 11.5–15.5)
WBC: 8.3 10*3/uL (ref 4.0–10.5)
nRBC: 0 % (ref 0.0–0.2)

## 2023-02-22 LAB — BASIC METABOLIC PANEL
Anion gap: 15 (ref 5–15)
BUN: 12 mg/dL (ref 6–20)
CO2: 25 mmol/L (ref 22–32)
Calcium: 8.9 mg/dL (ref 8.9–10.3)
Chloride: 97 mmol/L — ABNORMAL LOW (ref 98–111)
Creatinine, Ser: 0.72 mg/dL (ref 0.61–1.24)
GFR, Estimated: >60 mL/min (ref >60)
Glucose, Bld: 246 mg/dL — ABNORMAL HIGH (ref 70–99)
Potassium: 3.4 mmol/L — ABNORMAL LOW (ref 3.5–5.1)
Sodium: 137 mmol/L (ref 135–145)

## 2023-02-22 LAB — GLUCOSE, CAPILLARY: Glucose-Capillary: 203 mg/dL — ABNORMAL HIGH (ref 70–99)

## 2023-02-22 MED ORDER — LIDOCAINE 5 % EX PTCH
1.0000 | MEDICATED_PATCH | CUTANEOUS | Status: DC
  Administered 2023-02-22 – 2023-03-06 (×10): 1 via TRANSDERMAL
  Filled 2023-02-22 (×12): qty 1

## 2023-02-22 MED ORDER — ACETAMINOPHEN 650 MG RE SUPP: 650.0000 mg | Freq: Four times a day (QID) | RECTAL | Status: DC | PRN

## 2023-02-22 MED ORDER — CEFADROXIL 500 MG PO CAPS
1000.0000 mg | ORAL_CAPSULE | Freq: Two times a day (BID) | ORAL | Status: DC
  Administered 2023-02-22 – 2023-02-25 (×7): 1000 mg via ORAL
  Filled 2023-02-22 (×8): qty 2

## 2023-02-22 MED ORDER — MAGNESIUM SULFATE 2 GM/50ML IV SOLN
2.0000 g | Freq: Once | INTRAVENOUS | Status: AC
  Administered 2023-02-22: 2 g via INTRAVENOUS
  Filled 2023-02-22: qty 50

## 2023-02-22 MED ORDER — HYDROMORPHONE HCL 1 MG/ML IJ SOLN
1.0000 mg | INTRAMUSCULAR | Status: DC | PRN
  Administered 2023-02-22 – 2023-02-25 (×14): 1 mg via INTRAVENOUS
  Administered 2023-02-25: 0.5 mg via INTRAVENOUS
  Administered 2023-02-25 – 2023-02-28 (×8): 1 mg via INTRAVENOUS
  Filled 2023-02-22 (×24): qty 1

## 2023-02-22 MED ORDER — LORAZEPAM 2 MG/ML IJ SOLN
2.0000 mg | Freq: Once | INTRAMUSCULAR | Status: AC
  Administered 2023-02-22: 2 mg via INTRAVENOUS
  Filled 2023-02-22: qty 1

## 2023-02-22 MED ORDER — ONDANSETRON HCL 4 MG/2ML IJ SOLN
4.0000 mg | Freq: Once | INTRAMUSCULAR | Status: AC
  Administered 2023-02-22: 4 mg via INTRAVENOUS
  Filled 2023-02-22: qty 2

## 2023-02-22 MED ORDER — KETOROLAC TROMETHAMINE 30 MG/ML IJ SOLN
30.0000 mg | Freq: Four times a day (QID) | INTRAMUSCULAR | Status: AC | PRN
  Administered 2023-02-22 – 2023-02-23 (×3): 30 mg via INTRAVENOUS
  Filled 2023-02-22 (×3): qty 1

## 2023-02-22 MED ORDER — ACETAMINOPHEN 325 MG PO TABS
650.0000 mg | ORAL_TABLET | Freq: Four times a day (QID) | ORAL | Status: DC | PRN
  Administered 2023-02-26 – 2023-03-06 (×3): 650 mg via ORAL
  Filled 2023-02-22 (×4): qty 2

## 2023-02-22 MED ORDER — DIAZEPAM 5 MG/ML IJ SOLN
5.0000 mg | Freq: Once | INTRAMUSCULAR | Status: AC | PRN
  Administered 2023-02-22: 5 mg via INTRAVENOUS
  Filled 2023-02-22: qty 2

## 2023-02-22 MED ORDER — ONDANSETRON HCL 4 MG/2ML IJ SOLN: 4.0000 mg | Freq: Four times a day (QID) | INTRAMUSCULAR | Status: DC | PRN

## 2023-02-22 MED ORDER — ENOXAPARIN SODIUM 40 MG/0.4ML IJ SOSY
40.0000 mg | PREFILLED_SYRINGE | INTRAMUSCULAR | Status: DC
  Administered 2023-02-22 – 2023-03-03 (×9): 40 mg via SUBCUTANEOUS
  Filled 2023-02-22 (×9): qty 0.4

## 2023-02-22 MED ORDER — OXYCODONE-ACETAMINOPHEN 10-325 MG PO TABS: 1.0000 | ORAL_TABLET | Freq: Every day | ORAL | Status: DC

## 2023-02-22 MED ORDER — HYDROMORPHONE HCL 2 MG/ML IJ SOLN
2.0000 mg | Freq: Once | INTRAMUSCULAR | Status: AC
  Administered 2023-02-22: 2 mg via INTRAVENOUS
  Filled 2023-02-22: qty 1

## 2023-02-22 MED ORDER — MORPHINE SULFATE ER 15 MG PO TBCR
30.0000 mg | EXTENDED_RELEASE_TABLET | Freq: Two times a day (BID) | ORAL | Status: DC
  Administered 2023-02-22 – 2023-03-01 (×14): 30 mg via ORAL
  Filled 2023-02-22 (×14): qty 2

## 2023-02-22 MED ORDER — OXYCODONE-ACETAMINOPHEN 5-325 MG PO TABS
1.0000 | ORAL_TABLET | Freq: Every day | ORAL | Status: DC
  Administered 2023-02-22 – 2023-02-26 (×19): 1 via ORAL
  Filled 2023-02-22 (×20): qty 1

## 2023-02-22 MED ORDER — ONDANSETRON HCL 4 MG PO TABS: 4.0000 mg | ORAL_TABLET | Freq: Four times a day (QID) | ORAL | Status: DC | PRN

## 2023-02-22 MED ORDER — BACLOFEN 10 MG PO TABS
5.0000 mg | ORAL_TABLET | Freq: Three times a day (TID) | ORAL | Status: DC
  Administered 2023-02-22 – 2023-02-28 (×18): 5 mg via ORAL
  Filled 2023-02-22 (×18): qty 1

## 2023-02-22 MED ORDER — OXYCODONE HCL 5 MG PO TABS
5.0000 mg | ORAL_TABLET | Freq: Every day | ORAL | Status: DC
  Administered 2023-02-22 – 2023-02-26 (×19): 5 mg via ORAL
  Filled 2023-02-22 (×20): qty 1

## 2023-02-22 MED ORDER — INSULIN ASPART 100 UNIT/ML IJ SOLN: 0.0000 [IU] | Freq: Three times a day (TID) | INTRAMUSCULAR | Status: DC

## 2023-02-22 MED ORDER — HYDROMORPHONE HCL 1 MG/ML IJ SOLN
1.0000 mg | Freq: Once | INTRAMUSCULAR | Status: AC
  Administered 2023-02-22: 1 mg via INTRAVENOUS
  Filled 2023-02-22: qty 1

## 2023-02-22 MED ORDER — DOXYCYCLINE HYCLATE 100 MG PO TABS
100.0000 mg | ORAL_TABLET | Freq: Two times a day (BID) | ORAL | Status: DC
  Administered 2023-02-22 – 2023-02-25 (×7): 100 mg via ORAL
  Filled 2023-02-22 (×7): qty 1

## 2023-02-22 MED ORDER — POTASSIUM CHLORIDE CRYS ER 20 MEQ PO TBCR
40.0000 meq | EXTENDED_RELEASE_TABLET | Freq: Once | ORAL | Status: AC
  Administered 2023-02-22: 40 meq via ORAL
  Filled 2023-02-22: qty 2

## 2023-02-22 NOTE — ED Notes (Signed)
ED TO INPATIENT HANDOFF REPORT  ED Nurse Name and Phone #: Thurmon Fair. Manson Passey, RN [phone]  S Name/Age/Gender John Ware 57 y.o. male Room/Bed: WA24/WA24  Code Status   Code Status: Prior  Home/SNF/Other Home Patient oriented to: self, place, time, and situation Is this baseline? Yes   Triage Complete: Triage complete  Chief Complaint Vertebral osteomyelitis (HCC) [M46.20]  Triage Note Pt arrived via POV. Was supposed to have MRI done at 0800, but was unable to lay flat d/t pain. Was told to come to the ED so he could get medicated to get the MRI.   Allergies Allergies  Allergen Reactions   Tizanidine Other (See Comments)    Leg cramps    Level of Care/Admitting Diagnosis ED Disposition     ED Disposition  Admit   Condition  --   Comment  Hospital Area: [facility] [[id number]]  Level of Care: Med-Surg [16]  May admit patient to [facility] or [facility] if equivalent level of care is available:: No  Covid Evaluation: Asymptomatic - no recent exposure (last 10 days) testing not required  Diagnosis: Vertebral osteomyelitis [facility]) [[id number]]  Admitting Physician: Bobette Mo [[id number]]  Attending Physician: Bobette Mo [[id number]]  Certification:: I certify this patient will need inpatient services for at least 2 midnights  Estimated Length of Stay: 2          B Medical/Surgery History Past Medical History:  Diagnosis Date   Depression    Diabetes mellitus without complication (HCC)    Hypertension    Spinal stenosis    With Neurogenic Claudication   Past Surgical History:  Procedure Laterality Date   CARPAL TUNNEL RELEASE Bilateral    INCISION AND DRAINAGE ABSCESS Left 03/12/2021   Procedure: INCISION AND DRAINAGE ABSCESS;  Surgeon: [name], MD;  Location: [facility];  Service: Orthopedics;  Laterality: Left;   IR FLUORO GUIDED NEEDLE PLC ASPIRATION/INJECTION LOC  04/24/2022   IR FLUORO GUIDED NEEDLE PLC  ASPIRATION/INJECTION LOC  12/05/2022   IR US GUIDE BX ASP/DRAIN  02/02/2021   IRRIGATION AND DEBRIDEMENT ABSCESS Left 02/08/2021   Procedure: MINOR INCISION AND DRAINAGE OF ABSCESS;  Surgeon: [name], MD;  Location: [facility];  Service: Orthopedics;  Laterality: Left;   IRRIGATION AND DEBRIDEMENT ABSCESS Left 02/10/2021   Procedure: IRRIGATION AND DEBRIDEMENT ABSCESS LEFT LATERAL CHEST WALL;  Surgeon: [name], MD;  Location: [facility];  Service: General;  Laterality: Left;   IRRIGATION AND DEBRIDEMENT ABSCESS Bilateral 02/20/2021   Procedure: MINOR INCISION AND DRAINAGE OF ABSCESS/WITH BILATERAL WOUND VAC PLACEMENT;  Surgeon: [name], MD;  Location: [facility];  Service: Orthopedics;  Laterality: Bilateral;   IRRIGATION AND DEBRIDEMENT SHOULDER Bilateral 02/28/2021   Procedure: IRRIGATION AND DEBRIDEMENT SHOULDER;  Surgeon: [name], MD;  Location: [facility];  Service: Orthopedics;  Laterality: Bilateral;   IRRIGATION AND DEBRIDEMENT SHOULDER Left 03/06/2021   Procedure: IRRIGATION AND DEBRIDEMENT SHOULDER;  Surgeon: [name], MD;  Location: [facility];  Service: Orthopedics;  Laterality: Left;   IRRIGATION AND DEBRIDEMENT SHOULDER Left 03/08/2021   Procedure: IRRIGATION AND DEBRIDEMENT SHOULDER;  Surgeon: [name], MD;  Location: [facility];  Service: Orthopedics;  Laterality: Left;   LUMBAR LAMINECTOMY FOR EPIDURAL ABSCESS N/A 05/02/2022   Procedure: THORACIC  LAMINECTOMY AND DEBRIDEMENT  FOR EPIDURAL ABSCESS;  Surgeon: [name], MD;  Location: [facility] OR;  Service: Neurosurgery;  Laterality: N/A;   LUMBAR LAMINECTOMY/DECOMPRESSION MICRODISCECTOMY N/A 12/13/2014   Procedure: LUMBAR THREE-FOUR, LUMBAR FOUR-FIVE, LUMBAR  FIVE-SACRAL ONE LAMINECTOMY;  Surgeon: [name], MD;  Location: [facility];  Service: Neurosurgery;  Laterality: N/A;  L3-4 L4-5 L5-S1 Laminectomy   LUMBAR LAMINECTOMY/DECOMPRESSION MICRODISCECTOMY Left 06/05/2021   Procedure: Left lumbar one-two, Lumbar two-three  Laminectomy;  Surgeon: [name], MD;  Location[facility];  Service: Neurosurgery;  Laterality: Left;   MENISCUS REPAIR     Right knee   MINOR APPLICATION OF WOUND VAC Left 02/20/2021   Procedure: MINOR APPLICATION OF KERLIX PACKING/REMOVAL OF WOUND VAC;  Surgeon: [name], MD;  Location: [facility];  Service: General;  Laterality: Left;   TEE WITHOUT CARDIOVERSION N/A 02/05/2021   Procedure: TRANSESOPHAGEAL ECHOCARDIOGRAM (TEE);  Surgeon: Quintella Reichert, MD;  Location: [facility] ENDOSCOPY;  Service: Cardiovascular;  Laterality: N/A;   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   TOTAL SHOULDER ARTHROPLASTY Right 02/10/2021   Procedure: RIGHT OPEN SHOULDER I & D, OPEN DISTAL CLAVICLE RESECTION;  Surgeon: [name], MD;  Location: [facility];  Service: Orthopedics;  Laterality: Right;   VASECTOMY       A IV Location/Drains/Wounds Patient Lines/Drains/Airways Status     Active Line/Drains/Airways     Name Placement date Placement time Site Days   Peripheral IV 02/22/23 20 G 1" Anterior;Right Forearm 02/22/23  0948  Forearm  less than 1   PICC Single Lumen 12/06/22 Right Basilic 38 cm 0 cm 12/06/22  0454  Basilic  78            Intake/Output Last 24 hours No intake or output data in the 24 hours ending 02/22/23 1405  Labs/Imaging Results for orders placed or performed during the hospital encounter of 02/22/23 (from the past 48 hour(s))  CBC with Differential     Status: Abnormal   Collection Time: 02/22/23 10:56 AM  Result Value Ref Range   WBC 8.3 4.0 - 10.5 K/uL   RBC 4.95 4.22 - 5.81 MIL/uL   Hemoglobin 13.7 13.0 - 17.0 g/dL   HCT 09.8 11.9 - 14.7 %   MCV 85.9 80.0 - 100.0 fL   MCH 27.7 26.0 - 34.0 pg   MCHC 32.2 30.0 - 36.0 g/dL   RDW 82.9 (H) 56.2 - 13.0 %   Platelets 478 (H) 150 - 400 K/uL   nRBC 0.0 0.0 - 0.2 %   Neutrophils Relative % 65 %   Neutro Abs 5.4 1.7 - 7.7 K/uL   Lymphocytes Relative 27 %   Lymphs Abs 2.2 0.7 - 4.0 K/uL   Monocytes Relative 6 %    Monocytes Absolute 0.5 0.1 - 1.0 K/uL   Eosinophils Relative 1 %   Eosinophils Absolute 0.1 0.0 - 0.5 K/uL   Basophils Relative 1 %   Basophils Absolute 0.0 0.0 - 0.1 K/uL   Immature Granulocytes 0 %   Abs Immature Granulocytes 0.02 0.00 - 0.07 K/uL    Comment: Performed at [facility], [address], [place], [place] [address]  Basic metabolic panel     Status: Abnormal   Collection Time: 02/22/23 10:56 AM  Result Value Ref Range   Sodium 137 135 - 145 mmol/L   Potassium 3.4 (L) 3.5 - 5.1 mmol/L   Chloride 97 (L) 98 - 111 mmol/L   CO2 25 22 - 32 mmol/L   Glucose, Bld 246 (H) 70 - 99 mg/dL    Comment: Glucose reference range applies only to samples taken after fasting for at least 8 hours.   BUN 12 6 - 20  mg/dL   Creatinine, Ser 6.57 0.61 - 1.24 mg/dL   Calcium 8.9 8.9 - 84.6 mg/dL   GFR, Estimated >96 >29 mL/min    Comment: (NOTE) Calculated using the CKD-EPI Creatinine Equation (2021)    Anion gap 15 5 - 15    Comment: Performed at [facility], [address], [place], [place] [address]   No results found.  Pending Labs Unresulted Labs (From admission, onward)    None       Vitals/Pain Today's Vitals   02/22/23 0852 02/22/23 1019 02/22/23 1135 02/22/23 1332  BP: (!) 137/102 (!) 151/90  (!) 123/96  Pulse: (!) 115 (!) 106  (!) 107  Resp: 20 20  18   Temp:    98.6 F (37 C)  TempSrc:      SpO2: 95% 94%  96%  Weight:      Height:      PainSc:   7  6     Isolation Precautions No active isolations  Medications Medications  lidocaine (LIDODERM) 5 % 1 patch (1 patch Transdermal Patch Applied 02/22/23 1334)  HYDROmorphone (DILAUDID) injection 2 mg (2 mg Intravenous Given 02/22/23 1056)  ondansetron (ZOFRAN) injection 4 mg (4 mg Intravenous Given 02/22/23 1056)  LORazepam (ATIVAN) injection 2 mg (2 mg Intravenous Given 02/22/23 1130)  HYDROmorphone (DILAUDID) injection 1 mg (1 mg Intravenous Given 02/22/23 1333)     Mobility walks     Focused Assessments Severe thoracolumbar pain   R Recommendations: See Admitting Provider Note  Report given to:   Additional Notes: A/Ox4, independent. Fall risk due to pain medication

## 2023-02-22 NOTE — ED Notes (Signed)
MRI advised pt was unable to tolerate lying flat for scan. EDP notified.

## 2023-02-22 NOTE — Progress Notes (Signed)
Attempted MRI with several med doses given by RN. One given in department after original dosing proved insufficient. Pt unable to lay flat for exam (required) due to extreme pain even with additionally provided padding and comfort measures. Sent back to room.

## 2023-02-22 NOTE — H&P (Signed)
History and Physical    Patient: John Ware John Ware:[id number] DOB: 1966-05-16 DOA: 02/22/2023 DOS: the patient was seen and examined on 02/22/2023 PCP: Junie Spencer, FNP  Patient coming from: Home  Chief Complaint:  Chief Complaint  Patient presents with   Back Pain   HPI: John Ware is a 57 y.o. male with medical history significant of depression, type 2 diabetes, hypertension, spinal stenosis with neurogenic claudication, chronic vertebral osteomyelitis who has been treated recently with cefadroxil and doxycycline who has been actively following with neurosurgery and ID, who was sent to the emergency department to get premedicated for T-spine MRI.  Unfortunately, the patient was unable to do the scan due to extreme pain.  No fecal or urinary incontinence, saddle anesthesia, but stated that his lower extremities are weaker.  At the request of neurosurgery, he was transferred to [facility] and despite aggressive premedication with hydromorphone, diazepam, baclofen prior to imaging he was unable to tolerate it.  Infectious diseases were also notified. He denied fever, chills, rhinorrhea, sore throat, wheezing or hemoptysis.  No chest pain, palpitations, diaphoresis, PND, orthopnea or pitting edema of the lower extremities.  No abdominal pain, nausea, emesis, diarrhea, constipation, melena or hematochezia.  No flank pain, dysuria, frequency or hematuria.  No polyuria, polydipsia, polyphagia or blurred vision.   Lab work: CBC showed a white count of 8.3, Monnin 13.7 g/dL and platelets 147.  BMP showed a glucose of 246 mg/dL potassium 3.4 and chloride of 97 mmol/L.  The rest of the BMP measurements were normal.  ED course: Initial vital signs were temperature 98.2 F, pulse 115, respiration 20, BP 137/102 mmHg O2 sat 95% on room air.  The patient and received hydromorphone 2 mg IVP x 1, hydromorphone 1 mg IVP x 1, lorazepam 2 mg IVP x 1, ondansetron 4 mg IVP x 1.  Review of  Systems: As mentioned in the history of present illness. All other systems reviewed and are negative. Past Medical History:  Diagnosis Date   Depression    Diabetes mellitus without complication (HCC)    Hypertension    Spinal stenosis    With Neurogenic Claudication   Past Surgical History:  Procedure Laterality Date   CARPAL TUNNEL RELEASE Bilateral    INCISION AND DRAINAGE ABSCESS Left 03/12/2021   Procedure: INCISION AND DRAINAGE ABSCESS;  Surgeon: [name], MD;  Location: [facility];  Service: Orthopedics;  Laterality: Left;   IR FLUORO GUIDED NEEDLE PLC ASPIRATION/INJECTION LOC  04/24/2022   IR FLUORO GUIDED NEEDLE PLC ASPIRATION/INJECTION LOC  12/05/2022   IR US GUIDE BX ASP/DRAIN  02/02/2021   IRRIGATION AND DEBRIDEMENT ABSCESS Left 02/08/2021   Procedure: MINOR INCISION AND DRAINAGE OF ABSCESS;  Surgeon: [name], MD;  Location: [facility];  Service: Orthopedics;  Laterality: Left;   IRRIGATION AND DEBRIDEMENT ABSCESS Left 02/10/2021   Procedure: IRRIGATION AND DEBRIDEMENT ABSCESS LEFT LATERAL CHEST WALL;  Surgeon: [name], MD;  Location: [facility];  Service: General;  Laterality: Left;   IRRIGATION AND DEBRIDEMENT ABSCESS Bilateral 02/20/2021   Procedure: MINOR INCISION AND DRAINAGE OF ABSCESS/WITH BILATERAL WOUND VAC PLACEMENT;  Surgeon: [name], MD;  Location: [facility];  Service: Orthopedics;  Laterality: Bilateral;   IRRIGATION AND DEBRIDEMENT SHOULDER Bilateral 02/28/2021   Procedure: IRRIGATION AND DEBRIDEMENT SHOULDER;  Surgeon: [name], MD;  Location: [facility];  Service: Orthopedics;  Laterality: Bilateral;   IRRIGATION AND DEBRIDEMENT SHOULDER Left 03/06/2021   Procedure: IRRIGATION AND DEBRIDEMENT SHOULDER;  Surgeon: Margarita Rana  D, MD;  Location: [facility];  Service: Orthopedics;  Laterality: Left;   IRRIGATION AND DEBRIDEMENT SHOULDER Left 03/08/2021   Procedure: IRRIGATION AND DEBRIDEMENT SHOULDER;  Surgeon: [name], MD;  Location: [facility];  Service:  Orthopedics;  Laterality: Left;   LUMBAR LAMINECTOMY FOR EPIDURAL ABSCESS N/A 05/02/2022   Procedure: THORACIC  LAMINECTOMY AND DEBRIDEMENT  FOR EPIDURAL ABSCESS;  Surgeon: [name], MD;  Location: [facility];  Service: Neurosurgery;  Laterality: N/A;   LUMBAR LAMINECTOMY/DECOMPRESSION MICRODISCECTOMY N/A 12/13/2014   Procedure: LUMBAR THREE-FOUR, LUMBAR FOUR-FIVE, LUMBAR FIVE-SACRAL ONE LAMINECTOMY;  Surgeon: [name], MD;  Location: [facility];  Service: Neurosurgery;  Laterality: N/A;  L3-4 L4-5 L5-S1 Laminectomy   LUMBAR LAMINECTOMY/DECOMPRESSION MICRODISCECTOMY Left 06/05/2021   Procedure: Left lumbar one-two, Lumbar two-three Laminectomy;  Surgeon: [name], MD;  Location: [facility];  Service: Neurosurgery;  Laterality: Left;   MENISCUS REPAIR     Right knee   MINOR APPLICATION OF WOUND VAC Left 02/20/2021   Procedure: MINOR APPLICATION OF KERLIX PACKING/REMOVAL OF WOUND VAC;  Surgeon: [name], MD;  Location: [facility];  Service: General;  Laterality: Left;   TEE WITHOUT CARDIOVERSION N/A 02/05/2021   Procedure: TRANSESOPHAGEAL ECHOCARDIOGRAM (TEE);  Surgeon: Quintella Reichert, MD;  Location: [facility] ENDOSCOPY;  Service: Cardiovascular;  Laterality: N/A;   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 His smoking use included cigarettes. He has a 30.00 pack-year smoking history. He has never been exposed to tobacco smoke. He has never used smokeless tobacco. He reports current alcohol use. He reports that he does not use drugs.  Allergies  Allergen Reactions   Tizanidine Other (See Comments)    Leg cramps    Family History  Problem Relation Age of Onset   Cancer Mother    Colon cancer Neg  Hx     Prior to Admission medications   Medication Sig Start Date End Date Taking? Authorizing Provider  acetaminophen (TYLENOL) 500 MG tablet Take 1 tablet (500 mg total) by mouth every 6 (six) hours as needed. 06/26/21   Love, Evlyn Kanner, PA-C  Baclofen 5 MG TABS Take 1 tablet (5 mg total) by mouth 3 (three) times daily. 11/13/22     Baclofen 5 MG TABS Take 1 tablet (5 mg total) by mouth 3 (three) times daily. 02/05/23     cefadroxil (DURICEF) 500 MG capsule Take 2 capsules (1,000 mg total) by mouth 2 (two) times daily. 01/30/23   Odette Fraction, MD  Continuous Blood Gluc Sensor (FREESTYLE LIBRE 2 SENSOR) MISC CHECK BLOOD SUGAR 4 TIMES A DAY 08/22/22   Hawks, Neysa Bonito A, FNP  doxycycline (VIBRA-TABS) 100 MG tablet Take 1 tablet (100 mg total) by mouth every 12 (twelve) hours. 12/09/22 12/04/23  Dorcas Carrow, MD  DULoxetine (CYMBALTA) 60 MG capsule Take 1 capsule (60 mg total) by mouth at bedtime. 11/13/22     gabapentin (NEURONTIN) 400 MG capsule Take 1 capsule (400 mg total) by mouth 2 (two) times daily. 08/16/22   Jannifer Rodney A, FNP  Gabapentin, Once-Daily, 600 MG TABS Take 1 tablet (600 mg total) by mouth daily. 02/05/23     hydrOXYzine (ATARAX) 10 MG tablet Take 1  tablet (10 mg total) by mouth 3 (three) times daily. 02/05/23     morphine (KADIAN) 30 MG 24 hr capsule Take 1 capsule (30 mg total) by mouth every 12 (twelve) hours. 02/05/23     morphine (MS CONTIN) 30 MG 12 hr tablet Take 1 tablet by mouth every twelve hours 01/08/23     naloxone (NARCAN) 0.4 MG/ML injection Inject 1 mL (0.4 mg total) into the vein as needed. 06/26/21   Love, Evlyn Kanner, PA-C  oxyCODONE-acetaminophen (PERCOCET) 10-325 MG tablet Take 1 tablet by mouth every 6 (six) hours as needed (breakthrough pain). 05/17/22   Love, Evlyn Kanner, PA-C  oxyCODONE-acetaminophen (PERCOCET) 10-325 MG tablet Take 1 tablet by mouth every 6 (six) hours. Stop taking methadone. 05/23/22     oxyCODONE-acetaminophen (PERCOCET) 10-325 MG tablet Take 1  tablet by mouth 5 (five) times daily. 07/24/22     oxyCODONE-acetaminophen (PERCOCET) 10-325 MG tablet Take 1 tablet by mouth 5 (five) times daily. 08/21/22     oxyCODONE-acetaminophen (PERCOCET) 10-325 MG tablet Take 1 tablet by mouth 5 (five) times daily. 09/18/22     oxyCODONE-acetaminophen (PERCOCET) 10-325 MG tablet Take 1 tablet by mouth 5 (five) times daily. 10/16/22     oxyCODONE-acetaminophen (PERCOCET) 10-325 MG tablet Take 1 tablet by mouth 5 times daily. 02/05/23       Physical Exam: Vitals:   02/22/23 0848 02/22/23 0849 02/22/23 0852 02/22/23 1019  BP:   (!) 137/102 (!) 151/90  Pulse:   (!) 115 (!) 106  Resp:   20 20  Temp: 98.2 F (36.8 C)     TempSrc: Oral     SpO2:   95% 94%  Weight:  81.6 kg    Height:  5\' 10"  (1.778 m)     Physical Exam Vitals reviewed.  Constitutional:      General: He is awake. He is not in acute distress.    Appearance: He is not ill-appearing.  HENT:     Head: Normocephalic.     Nose: No rhinorrhea.  Eyes:     General: No scleral icterus.    Pupils: Pupils are equal, round, and reactive to light.  Neck:     Vascular: No JVD.  Cardiovascular:     Rate and Rhythm: Normal rate and regular rhythm.  Pulmonary:     Effort: Pulmonary effort is normal.     Breath sounds: Normal breath sounds.  Abdominal:     General: Bowel sounds are normal. There is no distension.     Palpations: Abdomen is soft.     Tenderness: There is no abdominal tenderness. There is no guarding.  Musculoskeletal:     Cervical back: Neck supple.     Thoracic back: Tenderness present. Decreased range of motion.     Lumbar back: Tenderness present. Decreased range of motion.     Right lower leg: No edema.     Left lower leg: No edema.  Skin:    General: Skin is warm and dry.  Neurological:     General: No focal deficit present.     Mental Status: He is alert and oriented to person, place, and time.  Psychiatric:        Mood and Affect: Mood normal.        Behavior:  Behavior normal. Behavior is cooperative.   Data Reviewed:  Results are pending, will review when available.  Assessment and Plan: Principal Problem:   Vertebral osteomyelitis (HCC) Observation/MedSurg. Analgesics as needed. Antiemetics as needed. Continue cefadroxil 1000  mg p.o. twice daily. Continue doxycycline 100 mg p.o. twice daily. Follow CBC, CMP and lipase in AM. Having significant difficulty lying down on MRI scanner. Will let him have his home medications, add Toradol. Will try again later this evening to allow meds time to work. Will premedicate again on called to MRI scanner. Follow-up official neurosurgery and ID consults.  Active Problems:   DM2 (diabetes mellitus, type 2) (HCC) With hyperglycemia. Carbohydrate modified diet. CBG monitoring with RI SS while in the hospital. Check hemoglobin A1c with morning labs.    Hyperlipidemia associated with type 2 diabetes mellitus (HCC) Currently not on medical therapy. Outpatient follow-up to discuss this with PCP.    HTN (hypertension) Optimize pain control. Oral or parenteral antihypertensives as needed.    Thrombocytosis In the setting of chronic infection. Monitor platelet count.    Hypokalemia Replacement given. Magnesium sulfate supplementation ordered. Follow-up potassium level in AM.     Advance Care Planning:   Code Status: Full Code   Consults:   Family Communication:   Severity of Illness: The appropriate patient status for this patient is INPATIENT. Inpatient status is judged to be reasonable and necessary in order to provide the required intensity of service to ensure the patient's safety. The patient's presenting symptoms, physical exam findings, and initial radiographic and laboratory data in the context of their chronic comorbidities is felt to place them at high risk for further clinical deterioration. Furthermore, it is not anticipated that the patient will be medically stable for discharge  from the hospital within 2 midnights of admission.   * I certify that at the point of admission it is my clinical judgment that the patient will require inpatient hospital care spanning beyond 2 midnights from the point of admission due to high intensity of service, high risk for further deterioration and high frequency of surveillance required.*  Author: Bobette Mo, MD 02/22/2023 1:31 PM  For on call review [email or web address].   This document was prepared using Dragon voice recognition software and may contain some unintended transcription errors.

## 2023-02-22 NOTE — ED Provider Notes (Signed)
[place] EMERGENCY DEPARTMENT AT [facility] Provider Note   CSN: [id number] Arrival date & time: 02/22/23  1324     History  Chief Complaint  Patient presents with   Back Pain    John Ware is a 58 y.o. male.  57 year old male presents today for worsening back pain.  Pain has been worsening for the past 2 weeks in the mid back and low back.  He has history of chronic osteomyelitis.  He is on Duricef and doxycycline.  He does follow with infectious disease, as well as neurosurgery.  He attempted outpatient MRI today for T-spine but was unable to tolerate this.  He came in to have medication prior to trying again.  Denies bowel or bladder dysfunction, saddle anesthesia, or fever.  Does endorse worsening weakness.  He is using a walker to get around.  The history is provided by the patient. No language interpreter was used.       Home Medications Prior to Admission medications   Medication Sig Start Date End Date Taking? Authorizing Provider  acetaminophen (TYLENOL) 500 MG tablet Take 1 tablet (500 mg total) by mouth every 6 (six) hours as needed. 06/26/21   Love, Evlyn Kanner, PA-C  Baclofen 5 MG TABS Take 1 tablet (5 mg total) by mouth 3 (three) times daily. 11/13/22     Baclofen 5 MG TABS Take 1 tablet (5 mg total) by mouth 3 (three) times daily. 02/05/23     cefadroxil (DURICEF) 500 MG capsule Take 2 capsules (1,000 mg total) by mouth 2 (two) times daily. 01/30/23   Odette Fraction, MD  Continuous Blood Gluc Sensor (FREESTYLE LIBRE 2 SENSOR) MISC CHECK BLOOD SUGAR 4 TIMES A DAY 08/22/22   Hawks, Neysa Bonito A, FNP  doxycycline (VIBRA-TABS) 100 MG tablet Take 1 tablet (100 mg total) by mouth every 12 (twelve) hours. 12/09/22 12/04/23  Dorcas Carrow, MD  DULoxetine (CYMBALTA) 60 MG capsule Take 1 capsule (60 mg total) by mouth at bedtime. 11/13/22     gabapentin (NEURONTIN) 400 MG capsule Take 1 capsule (400 mg total) by mouth 2 (two) times daily. 08/16/22   Jannifer Rodney A,  FNP  Gabapentin, Once-Daily, 600 MG TABS Take 1 tablet (600 mg total) by mouth daily. 02/05/23     hydrOXYzine (ATARAX) 10 MG tablet Take 1 tablet (10 mg total) by mouth 3 (three) times daily. 02/05/23     morphine (KADIAN) 30 MG 24 hr capsule Take 1 capsule (30 mg total) by mouth every 12 (twelve) hours. 02/05/23     morphine (MS CONTIN) 30 MG 12 hr tablet Take 1 tablet by mouth every twelve hours 01/08/23     naloxone (NARCAN) 0.4 MG/ML injection Inject 1 mL (0.4 mg total) into the vein as needed. 06/26/21   Love, Evlyn Kanner, PA-C  oxyCODONE-acetaminophen (PERCOCET) 10-325 MG tablet Take 1 tablet by mouth every 6 (six) hours as needed (breakthrough pain). 05/17/22   Love, Evlyn Kanner, PA-C  oxyCODONE-acetaminophen (PERCOCET) 10-325 MG tablet Take 1 tablet by mouth every 6 (six) hours. Stop taking methadone. 05/23/22     oxyCODONE-acetaminophen (PERCOCET) 10-325 MG tablet Take 1 tablet by mouth 5 (five) times daily. 07/24/22     oxyCODONE-acetaminophen (PERCOCET) 10-325 MG tablet Take 1 tablet by mouth 5 (five) times daily. 08/21/22     oxyCODONE-acetaminophen (PERCOCET) 10-325 MG tablet Take 1 tablet by mouth 5 (five) times daily. 09/18/22     oxyCODONE-acetaminophen (PERCOCET) 10-325 MG tablet Take 1 tablet by mouth 5 (five) times  daily. 10/16/22     oxyCODONE-acetaminophen (PERCOCET) 10-325 MG tablet Take 1 tablet by mouth 5 times daily. 02/05/23         Allergies    Tizanidine    Review of Systems   Review of Systems  Constitutional:  Negative for fever.  Genitourinary:  Negative for difficulty urinating.  Musculoskeletal:  Positive for back pain.  Neurological:  Positive for weakness. Negative for numbness.  All other systems reviewed and are negative.   Physical Exam Updated Vital Signs BP (!) 151/90   Pulse (!) 106   Temp 98.2 F (36.8 C) (Oral)   Resp 20   Ht 5\' 10"  (1.778 m)   Wt 81.6 kg   SpO2 94%   BMI 25.83 kg/m  Physical Exam Vitals and nursing note reviewed.  Constitutional:       General: He is not in acute distress.    Appearance: Normal appearance. He is not ill-appearing.  HENT:     Head: Normocephalic and atraumatic.     Nose: Nose normal.  Eyes:     General: No scleral icterus.    Extraocular Movements: Extraocular movements intact.     Conjunctiva/sclera: Conjunctivae normal.  Cardiovascular:     Rate and Rhythm: Regular rhythm. Tachycardia present.     Heart sounds: Normal heart sounds.  Pulmonary:     Effort: Pulmonary effort is normal. No respiratory distress.     Breath sounds: Normal breath sounds. No wheezing or rales.  Abdominal:     General: There is no distension.     Tenderness: There is no abdominal tenderness.  Musculoskeletal:        General: Normal range of motion.     Cervical back: Normal range of motion.  Skin:    General: Skin is warm and dry.  Neurological:     General: No focal deficit present.     Mental Status: He is alert. Mental status is at baseline.     Comments: Full range of motion in bilateral upper and lower extremities.  5/5 strength in both lower extremities and extensor and flexor muscle groups.      ED Results / Procedures / Treatments   Labs (all labs ordered are listed, but only abnormal results are displayed) Labs Reviewed  CBC WITH DIFFERENTIAL/PLATELET - Abnormal; Notable for the following components:      Result Value   RDW 15.8 (*)    Platelets 478 (*)    All other components within normal limits  BASIC METABOLIC PANEL - Abnormal; Notable for the following components:   Potassium 3.4 (*)    Chloride 97 (*)    Glucose, Bld 246 (*)    All other components within normal limits    EKG None  Radiology No results found.  Procedures Procedures    Medications Ordered in ED Medications  lidocaine (LIDODERM) 5 % 1 patch (0 patches Transdermal Hold 02/22/23 1056)  HYDROmorphone (DILAUDID) injection 1 mg (has no administration in time range)  HYDROmorphone (DILAUDID) injection 2 mg (2 mg  Intravenous Given 02/22/23 1056)  ondansetron (ZOFRAN) injection 4 mg (4 mg Intravenous Given 02/22/23 1056)  LORazepam (ATIVAN) injection 2 mg (2 mg Intravenous Given 02/22/23 1130)    ED Course/ Medical Decision Making/ A&P Clinical Course as of 02/22/23 1329  Sat Feb 22, 2023  1250 Patient unable to tolerate MRI.  This is the second attempt of anyone outpatient wound in the emergency department where he has not been able to tolerate MRIs.  Will  discuss with neurosurgery [AA]    Clinical Course User Index [AA] Marita Kansas, PA-C                             Medical Decision Making Amount and/or Complexity of Data Reviewed Labs: ordered. Radiology: ordered.  Risk Prescription drug management.   Medical Decision Making / ED Course   This patient presents to the ED for concern of back pain, this involves an extensive number of treatment options, and is a complaint that carries with it a high risk of complications and morbidity.  The differential diagnosis includes Duda myelitis, cauda equina syndrome, spinal epidural abscess  MDM: 57 year old male presents with worsening back pain.  Ongoing for the past 2 weeks.  No fever.  Will provide medications to obtain MRI.  Will add on L-spine to the T-spine that was ordered outpatient due to worsening low back pain as well.  He did have an MRI done at the end of May which showed severe canal stenosis, fluid collections in addition to the osteomyelitis.  Patient attempted MRI but was unsuccessful.  Will discuss with neurosurgery.  Neurosurgery agrees that patient should be admitted for pain control and have repeat MRI done during the hospitalization.  Also recommend consulting infectious disease during the admission for antibiotic guidance given worsening symptoms.  Discussed with hospitalist will evaluate patient for admission.  Lab Tests: -I ordered, reviewed, and interpreted labs.   The pertinent results include:   Labs Reviewed  CBC WITH  DIFFERENTIAL/PLATELET - Abnormal; Notable for the following components:      Result Value   RDW 15.8 (*)    Platelets 478 (*)    All other components within normal limits  BASIC METABOLIC PANEL - Abnormal; Notable for the following components:   Potassium 3.4 (*)    Chloride 97 (*)    Glucose, Bld 246 (*)    All other components within normal limits      EKG  EKG Interpretation Date/Time:    Ventricular Rate:    PR Interval:    QRS Duration:    QT Interval:    QTC Calculation:   R Axis:      Text Interpretation:           Imaging Studies ordered: I ordered imaging studies including MRI T-spine with and without contrast, MRI L-spine with and without contrast I independently visualized and interpreted imaging. I agree with the radiologist interpretation   Medicines ordered and prescription drug management: Meds ordered this encounter  Medications   HYDROmorphone (DILAUDID) injection 2 mg   ondansetron (ZOFRAN) injection 4 mg   lidocaine (LIDODERM) 5 % 1 patch   LORazepam (ATIVAN) injection 2 mg   HYDROmorphone (DILAUDID) injection 1 mg    -I have reviewed the patients home medicines and have made adjustments as needed  Consultations Obtained: I requested consultation with the neurosurg (Meyran),  and discussed lab and imaging findings as well as pertinent plan - they recommend: as above  Reevaluation: After the interventions noted above, I reevaluated the patient and found that they have :stayed the same  Co morbidities that complicate the patient evaluation  Past Medical History:  Diagnosis Date   Depression    Diabetes mellitus without complication (HCC)    Hypertension    Spinal stenosis    With Neurogenic Claudication      Dispostion: Discussed with hospitalist will evaluate patient for admission. Final Clinical Impression(s) / ED Diagnoses  Final diagnoses:  Chronic osteomyelitis [facility])    Rx / DC Orders ED Discharge Orders     None          Marita Kansas, PA-C 02/22/23 1337    Derwood Kaplan, MD 02/23/23 [id number]

## 2023-02-22 NOTE — ED Triage Notes (Signed)
Pt arrived via POV. Was supposed to have MRI done at 0800, but was unable to lay flat d/t pain. Was told to come to the ED so he could get medicated to get the MRI.

## 2023-02-23 ENCOUNTER — Encounter: Payer: Self-pay | Admitting: Infectious Diseases

## 2023-02-23 DIAGNOSIS — I10 Essential (primary) hypertension: Secondary | ICD-10-CM | POA: Diagnosis not present

## 2023-02-23 DIAGNOSIS — E876 Hypokalemia: Secondary | ICD-10-CM

## 2023-02-23 DIAGNOSIS — M4620 Osteomyelitis of vertebra, site unspecified: Secondary | ICD-10-CM | POA: Diagnosis not present

## 2023-02-23 DIAGNOSIS — E785 Hyperlipidemia, unspecified: Secondary | ICD-10-CM

## 2023-02-23 DIAGNOSIS — D75839 Thrombocytosis, unspecified: Secondary | ICD-10-CM

## 2023-02-23 DIAGNOSIS — E1169 Type 2 diabetes mellitus with other specified complication: Secondary | ICD-10-CM | POA: Diagnosis not present

## 2023-02-23 DIAGNOSIS — M549 Dorsalgia, unspecified: Secondary | ICD-10-CM

## 2023-02-23 LAB — COMPREHENSIVE METABOLIC PANEL
ALT: 15 U/L (ref 0–44)
AST: 17 U/L (ref 15–41)
Albumin: 3 g/dL — ABNORMAL LOW (ref 3.5–5.0)
Alkaline Phosphatase: 85 U/L (ref 38–126)
Anion gap: 11 (ref 5–15)
BUN: 14 mg/dL (ref 6–20)
CO2: 27 mmol/L (ref 22–32)
Calcium: 8.8 mg/dL — ABNORMAL LOW (ref 8.9–10.3)
Chloride: 98 mmol/L (ref 98–111)
Creatinine, Ser: 0.81 mg/dL (ref 0.61–1.24)
GFR, Estimated: >60 mL/min (ref >60)
Glucose, Bld: 127 mg/dL — ABNORMAL HIGH (ref 70–99)
Potassium: 4.1 mmol/L (ref 3.5–5.1)
Sodium: 136 mmol/L (ref 135–145)
Total Bilirubin: 0.9 mg/dL (ref 0.3–1.2)
Total Protein: 6 g/dL — ABNORMAL LOW (ref 6.5–8.1)

## 2023-02-23 LAB — CBC
HCT: 33.9 % — ABNORMAL LOW (ref 39.0–52.0)
Hemoglobin: 11.3 g/dL — ABNORMAL LOW (ref 13.0–17.0)
MCH: 27.4 pg (ref 26.0–34.0)
MCHC: 33.3 g/dL (ref 30.0–36.0)
MCV: 82.3 fL (ref 80.0–100.0)
Platelets: 365 10*3/uL (ref 150–400)
RBC: 4.12 MIL/uL — ABNORMAL LOW (ref 4.22–5.81)
RDW: 15.4 % (ref 11.5–15.5)
WBC: 7.6 10*3/uL (ref 4.0–10.5)
nRBC: 0 % (ref 0.0–0.2)

## 2023-02-23 LAB — GLUCOSE, CAPILLARY
Glucose-Capillary: 137 mg/dL — ABNORMAL HIGH (ref 70–99)
Glucose-Capillary: 184 mg/dL — ABNORMAL HIGH (ref 70–99)
Glucose-Capillary: 142 mg/dL — ABNORMAL HIGH (ref 70–99)

## 2023-02-23 MED ORDER — POTASSIUM CHLORIDE CRYS ER 20 MEQ PO TBCR
40.0000 meq | EXTENDED_RELEASE_TABLET | Freq: Once | ORAL | Status: AC
  Administered 2023-02-23: 40 meq via ORAL
  Filled 2023-02-23: qty 2

## 2023-02-23 MED ORDER — POTASSIUM CHLORIDE CRYS ER 20 MEQ PO TBCR: 40.0000 meq | EXTENDED_RELEASE_TABLET | Freq: Once | ORAL | Status: DC

## 2023-02-23 NOTE — Progress Notes (Signed)
PROGRESS NOTE    John Ware  ZOX:[id number] DOB: August 15, 1966 DOA: 02/22/2023 PCP: Junie Spencer, FNP    Brief Narrative:  John Ware is a 57 y.o. male with past medical history of depression, type 2 diabetes, hypertension,  spinal stenosis with neurogenic claudication, chronic vertebral osteomyelitis who has been treated recently with cefadroxil and doxycycline, active with neurosurgery and ID follow-up was sent to the ED to get premedicated for thoracic spine MRI.  Unfortunately, patient was unable to do the scan due to extreme pain.  He had been having increasing weakness of his lower extremity.  In the ED, patient was slightly tachycardic with elevated blood pressure at 137/102.  Patient had no leukocytosis.  There was mild hypokalemia at 3.4. The patient and received hydromorphone 2 mg IVP x 1, hydromorphone 1 mg IVP x 1, lorazepam 2 mg IVP x 1, ondansetron 4 mg IVP x 1 and was considered for admission to the hospital for further evaluation.  Assessment and Plan:   Vertebral osteomyelitis (HCC) Continue supportive care with analgesics.  Continue cefadroxil doxycycline.  Toradol has been added to his home medication regimen.  Patient states that he is unable to lie flat due to intractable pain.  Despite the multiple medications he was unable to lie down for MRI.  Will obtain MRI with sedation.  Follow neurosurgery and ID, no leukocytosis at this time.    DM2 (diabetes mellitus, type 2) with hyperglycemia. Check hemoglobin A1c.  Sliding scale insulin diabetic diet.     Hyperlipidemia associated with type 2 diabetes mellitus (HCC) Currently not on medical therapy.     HTN (hypertension) Focus on pain control.  As needed antihypertensives.     Thrombocytosis In the setting of chronic infection.Monitor platelet count.     Hypokalemia Mild.  Replenished.  Potassium today at 4.1.      DVT prophylaxis: enoxaparin (LOVENOX) injection 40 mg Start: 02/22/23 2200   Code  Status:     Code Status: Full Code  Disposition: Home likely in 1 to 2 days. Status is: Inpatient Remains inpatient appropriate because: Intractable pain, IV narcotics, need for MRI with sedation.   Family Communication: None at bedside  Consultants:  Neurosurgery Infectious disease  Procedures:  None yet  Antimicrobials:  Cefadroxil and doxycycline  Anti-infectives (From admission, onward)    Start     Dose/Rate Route Frequency Ordered Stop   02/22/23 2200  doxycycline (VIBRA-TABS) tablet 100 mg        100 mg Oral Every 12 hours 02/22/23 1509     02/22/23 2200  cefadroxil (DURICEF) capsule 1,000 mg        1,000 mg Oral 2 times daily 02/22/23 1509          Subjective: Today, patient was seen and examined at bedside.  Complains of  back pain and unable to lie flat.  Has been requiring multiple medications for pain.  Objective: Vitals:   02/22/23 2000 02/22/23 2317 02/23/23 0305 02/23/23 0715  BP:  117/76 115/79 124/77  Pulse: 99 81 77   Resp: 16 13 14    Temp:  98.3 F (36.8 C) 98.2 F (36.8 C) 98.4 F (36.9 C)  TempSrc:  Oral Oral Oral  SpO2: 95% 96% 95%   Weight:      Height:        Intake/Output Summary (Last 24 hours) at 02/23/2023 0944 Last data filed at 02/23/2023 0600 Gross per 24 hour  Intake 250 ml  Output 400 ml  Net -  150 ml   Filed Weights   02/22/23 0849 02/22/23 1449  Weight: 81.6 kg 79.7 kg    Physical Examination: Body mass index is 25.21 kg/m.   General:  Average built, not in obvious distress sitting up propped up in bed. HENT:   No scleral pallor or icterus noted. Oral mucosa is moist.  Chest:  Clear breath sounds.   No crackles or wheezes.  Tenderness on the back. CVS: S1 &S2 heard. No murmur.  Regular rate and rhythm. Abdomen: Soft, nontender, nondistended.  Bowel sounds are heard.   Extremities: No cyanosis, clubbing or edema.  Peripheral pulses are palpable. Psych: Alert, awake and oriented, normal mood CNS:  No cranial nerve  deficits.  Moving extremities. Skin: Warm and dry.  No rashes noted.  Data Reviewed:   CBC: Recent Labs  Lab 02/22/23 1056 02/23/23 0230  WBC 8.3 7.6  NEUTROABS 5.4  --   HGB 13.7 11.3*  HCT 42.5 33.9*  MCV 85.9 82.3  PLT 478* 365    Basic Metabolic Panel: Recent Labs  Lab 02/22/23 1056 02/23/23 0230  NA 137 136  K 3.4* 4.1  CL 97* 98  CO2 25 27  GLUCOSE 246* 127*  BUN 12 14  CREATININE 0.72 0.81  CALCIUM 8.9 8.8*    Liver Function Tests: Recent Labs  Lab 02/23/23 0230  AST 17  ALT 15  ALKPHOS 85  BILITOT 0.9  PROT 6.0*  ALBUMIN 3.0*     Radiology Studies: No results found.    LOS: 1 day    Joycelyn Das, MD [facility] Available via Epic secure chat 7am-7pm After these hours, please refer to coverage provider listed on [email or web address] 02/23/2023, 9:44 AM

## 2023-02-23 NOTE — Consult Note (Addendum)
[facility]    Date of Admission:  02/22/2023   Total days of inpatient antibiotics 2        Reason for Consult: Vertebral infection    Principal Problem:   Vertebral osteomyelitis (HCC) Active Problems:   Hyperlipidemia associated with type 2 diabetes mellitus (HCC)   DM2 (diabetes mellitus, type 2) (HCC)   HTN (hypertension)   Thrombocytosis   Hypokalemia   Assessment: 57 year old male with history of vertebral osteomyelitis on doxycycline for hardware associated chronic suppression, currently managed by infectious disease and neurosurgery admitted  for premedicated MRI T-spine #Thoracic discitis/osteomyelitis with phlegmon down to L1 -Patient has had multiple hospitalizations for infection followed by Dr. Elinor Parkinson closely.  He has a history of MRSA bacteremia with worsening lumbar discitis/osteomyelitis in 2022 requiring doxycycline suppression.  Also has a history of superimposed septic arthritis Pseudomonas infection status post I&D treated initially with cefepime.  In August 2023 he had T10-11 laminectomy treated with cefepime, or cultures with no growth then transition to Cipro due to concern for persistent infection.  He has been on doxycycline and Cipro for chronic suppression until admission on 12/03/2022 for intractable pain.  Found to have T and L-spine epidural phlegmon status post aspiration growing MSSA treated with cefazolin x 8 weeks transition to cefadroxil 1 g p.o. twice daily as repeat MRI showed small epidural phlegmon.  Currently on cefadroxil 1 g p.o. twice daily and doxycycline. -Pt denies fevers and chills, does report worsening back pain for the last few days.  Recommendations:  -F/U MRI T spine. -He is afebrile, no leukocytosis. Would continue home cefadroxil 1gm PO bid and doxy 100mg  PO bid. Till MRI results return.  #DM -A1c 7 on 08/16/22 -Glycemic control for optimal infeciton control Microbiology:    Antibiotics: Cefadroxil 1 g p.o. twice daily and doxycycline 1 g twice daily   HPI: John Ware is a 57 y.o. male with osteomyelitis/discitis on suppressive doxycycline, previously on suppressive Cipro and doxycycline followed by Dr. Whitney Muse for hardware associated vertebral lumbar osteomyelitis, currently completed cefazolin for 8 weeks then transition to cefadroxil 1 g p.o. twice daily on 6/6 for thoracic discitis and osteomyelitis with phlegmon extending to L1 status post IR aspiration growing MSSA on 4/11, diabetes, depression, hypertension sent to the ED for premedicate T-spine MRI.  Patient unable to do MRI due to pain.  Transferred to [facility] for imaging.  On arrival his vitals are stable, no leukocytosis. Pt states back pain is worse.    Review of Systems: Review of Systems  All other systems reviewed and are negative.   Past Medical History:  Diagnosis Date   Depression    Diabetes mellitus without complication (HCC)    Hypertension    Spinal stenosis    With Neurogenic Claudication    Social History   Tobacco Use   Smoking status: Former    Packs/day: 2.00    Years: 15.00    Additional pack years: 0.00    Total pack years: 30.00    Types: Cigarettes    Quit date: 2012    Years since quitting: 12.5    Passive exposure: Never   Smokeless tobacco: Never  Vaping Use   Vaping Use: Never used  Substance Use Topics   Alcohol use: Yes    Comment: occasional   Drug use: No    Family History  Problem Relation Age of Onset   Cancer Mother    Colon  cancer Neg Hx    Scheduled Meds:  baclofen  5 mg Oral TID   cefadroxil  1,000 mg Oral BID   doxycycline  100 mg Oral Q12H   enoxaparin (LOVENOX) injection  40 mg Subcutaneous Q24H   insulin aspart  0-15 Units Subcutaneous TID WC   lidocaine  1 patch Transdermal Q24H   morphine  30 mg Oral Q12H   oxyCODONE-acetaminophen  1 tablet Oral 5 X Daily   And   oxyCODONE  5 mg Oral 5 X Daily   Continuous  Infusions: PRN Meds:.acetaminophen **OR** acetaminophen, HYDROmorphone (DILAUDID) injection, ketorolac, ondansetron **OR** ondansetron (ZOFRAN) IV Allergies  Allergen Reactions   Tizanidine Other (See Comments)    Leg cramps    OBJECTIVE: Blood pressure 115/79, pulse 77, temperature 98.2 F (36.8 C), temperature source Oral, resp. rate 14, height 5\' 10"  (1.778 m), weight 79.7 kg, SpO2 95 %.  Physical Exam Constitutional:      General: He is not in acute distress.    Appearance: He is normal weight. He is not toxic-appearing.  HENT:     Head: Normocephalic and atraumatic.     Right Ear: External ear normal.     Left Ear: External ear normal.     Nose: No congestion or rhinorrhea.     Mouth/Throat:     Mouth: Mucous membranes are moist.     Pharynx: Oropharynx is clear.  Eyes:     Extraocular Movements: Extraocular movements intact.     Conjunctiva/sclera: Conjunctivae normal.     Pupils: Pupils are equal, round, and reactive to light.  Cardiovascular:     Rate and Rhythm: Normal rate and regular rhythm.     Heart sounds: No murmur heard.    No friction rub. No gallop.  Pulmonary:     Effort: Pulmonary effort is normal.     Breath sounds: Normal breath sounds.  Abdominal:     General: Abdomen is flat. Bowel sounds are normal.     Palpations: Abdomen is soft.  Musculoskeletal:        General: No swelling. Normal range of motion.     Cervical back: Normal range of motion and neck supple.  Skin:    General: Skin is warm and dry.     Comments: Post op scars on back , healed without tenderness on palpation.   Neurological:     General: No focal deficit present.     Mental Status: He is oriented to person, place, and time.  Psychiatric:        Mood and Affect: Mood normal.     Lab Results Lab Results  Component Value Date   WBC 7.6 02/23/2023   HGB 11.3 (L) 02/23/2023   HCT 33.9 (L) 02/23/2023   MCV 82.3 02/23/2023   PLT 365 02/23/2023    Lab Results  Component  Value Date   CREATININE 0.81 02/23/2023   BUN 14 02/23/2023   NA 136 02/23/2023   K 4.1 02/23/2023   CL 98 02/23/2023   CO2 27 02/23/2023    Lab Results  Component Value Date   ALT 15 02/23/2023   AST 17 02/23/2023   ALKPHOS 85 02/23/2023   BILITOT 0.9 02/23/2023       Danelle Earthly, MD [facility] 02/23/2023, 6:17 AM   I have personally spent 82 minutes involved in face-to-face and non-face-to-face activities for this patient on the day of the visit. Professional time spent includes the following activities:  Preparing to see the patient (review of tests), Obtaining and/or reviewing separately obtained history (admission/discharge record), Performing a medically appropriate examination and/or evaluation , Ordering medications/tests/procedures, referring and communicating with other health care professionals, Documenting clinical information in the EMR, Independently interpreting results (not separately reported), Communicating results to the patient/family/caregiver, Counseling and educating the patient/family/caregiver and Care coordination (not separately reported).

## 2023-02-23 NOTE — Plan of Care (Signed)

## 2023-02-24 ENCOUNTER — Ambulatory Visit: Payer: Self-pay | Admitting: Infectious Diseases

## 2023-02-24 DIAGNOSIS — E1169 Type 2 diabetes mellitus with other specified complication: Secondary | ICD-10-CM | POA: Diagnosis not present

## 2023-02-24 DIAGNOSIS — E876 Hypokalemia: Secondary | ICD-10-CM | POA: Diagnosis not present

## 2023-02-24 DIAGNOSIS — I10 Essential (primary) hypertension: Secondary | ICD-10-CM | POA: Diagnosis not present

## 2023-02-24 DIAGNOSIS — M4620 Osteomyelitis of vertebra, site unspecified: Secondary | ICD-10-CM | POA: Diagnosis not present

## 2023-02-24 DIAGNOSIS — M4624 Osteomyelitis of vertebra, thoracic region: Secondary | ICD-10-CM

## 2023-02-24 DIAGNOSIS — M4644 Discitis, unspecified, thoracic region: Secondary | ICD-10-CM

## 2023-02-24 LAB — BASIC METABOLIC PANEL
Anion gap: 10 (ref 5–15)
BUN: 11 mg/dL (ref 6–20)
CO2: 27 mmol/L (ref 22–32)
Calcium: 8.7 mg/dL — ABNORMAL LOW (ref 8.9–10.3)
Chloride: 95 mmol/L — ABNORMAL LOW (ref 98–111)
Creatinine, Ser: 0.73 mg/dL (ref 0.61–1.24)
GFR, Estimated: >60 mL/min (ref >60)
Glucose, Bld: 175 mg/dL — ABNORMAL HIGH (ref 70–99)
Potassium: 4.4 mmol/L (ref 3.5–5.1)
Sodium: 132 mmol/L — ABNORMAL LOW (ref 135–145)

## 2023-02-24 LAB — GLUCOSE, CAPILLARY
Glucose-Capillary: 127 mg/dL — ABNORMAL HIGH (ref 70–99)
Glucose-Capillary: 131 mg/dL — ABNORMAL HIGH (ref 70–99)
Glucose-Capillary: 202 mg/dL — ABNORMAL HIGH (ref 70–99)
Glucose-Capillary: 193 mg/dL — ABNORMAL HIGH (ref 70–99)

## 2023-02-24 LAB — CBC
HCT: 34.9 % — ABNORMAL LOW (ref 39.0–52.0)
Hemoglobin: 11.3 g/dL — ABNORMAL LOW (ref 13.0–17.0)
MCH: 27.2 pg (ref 26.0–34.0)
MCHC: 32.4 g/dL (ref 30.0–36.0)
MCV: 84.1 fL (ref 80.0–100.0)
Platelets: 365 10*3/uL (ref 150–400)
RBC: 4.15 MIL/uL — ABNORMAL LOW (ref 4.22–5.81)
RDW: 15.2 % (ref 11.5–15.5)
WBC: 9.7 10*3/uL (ref 4.0–10.5)
nRBC: 0 % (ref 0.0–0.2)

## 2023-02-24 LAB — HEMOGLOBIN A1C
Hgb A1c MFr Bld: 7.7 % — ABNORMAL HIGH (ref 4.8–5.6)
Mean Plasma Glucose: 174 mg/dL

## 2023-02-24 MED ORDER — KETOROLAC TROMETHAMINE 30 MG/ML IJ SOLN
30.0000 mg | Freq: Four times a day (QID) | INTRAMUSCULAR | Status: DC | PRN
  Administered 2023-02-25 – 2023-02-28 (×10): 30 mg via INTRAVENOUS
  Filled 2023-02-24 (×10): qty 1

## 2023-02-24 NOTE — TOC CM/SW Note (Signed)
Transition of Care [facility]) - Inpatient Brief Assessment   Patient Details  Name: John Ware MRN: [id number] Date of Birth: 1966/04/10  [facility]) CM/SW Contact:    Darrold Span, RN Phone Number: 02/24/2023, 1:52 PM   Clinical Narrative: Note pt in with Vertebral osteomyelitis - ID following, We will continue to monitor patient advancement through interdisciplinary progression rounds. If new patient transition needs arise, please place a TOC consult.   Transition of Care Asessment: Insurance and Status: Insurance coverage has been reviewed Patient has primary care physician: Yes Home environment has been reviewed: home Prior level of function:: self Prior/Current Home Services: No current home services Social Determinants of Health Reivew: SDOH reviewed no interventions necessary Readmission risk has been reviewed: Yes Transition of care needs: no transition of care needs at this time

## 2023-02-24 NOTE — Progress Notes (Signed)
PROGRESS NOTE    John Ware  ZOX:[id number] DOB: Nov 27, 1965 DOA: 02/22/2023 PCP: Junie Spencer, FNP    Brief Narrative:   John Ware is a 57 y.o. male with past medical history of depression, type 2 diabetes, hypertension,  spinal stenosis with neurogenic claudication, chronic vertebral osteomyelitis who has been treated recently with cefadroxil and doxycycline, active with neurosurgery and ID follow-up was sent to the ED to get premedicated for thoracic spine MRI.  Unfortunately, patient was unable to do the scan due to extreme pain.  He had been having increasing weakness of his lower extremity.  In the ED, patient was slightly tachycardic with elevated blood pressure at 137/102.  Patient had no leukocytosis.  There was mild hypokalemia at 3.4. The patient and received hydromorphone 2 mg IVP x 1, hydromorphone 1 mg IVP x 1, lorazepam 2 mg IVP x 1, ondansetron 4 mg IVP x 1 and was considered for admission to the hospital for further evaluation.  Assessment and Plan:   Vertebral osteomyelitis with intractable pain. Continue supportive care with analgesics.  Continue cefadroxil doxycycline.  Toradol has been added to his home medication regimen addition to IV Dilaudid, oxycodone, long-acting morphine, baclofen Lidoderm patch,.  Despite the multiple medications he was unable to lie down for MRI so will obtain MRI with sedation.  Order has been placed.  Follow neurosurgery and ID, no leukocytosis at this time.  Blood cultures pending at this time    DM2 (diabetes mellitus, type 2) with hyperglycemia. Pending hemoglobin A1c.  Continue sliding scale insulin diabetic diet.  Controlled at this time     Hyperlipidemia associated with type 2 diabetes mellitus (HCC) Currently not on medical therapy.     HTN (hypertension) Focus on pain control.  As needed antihypertensives.  Likely elevated secondary to pain.     Thrombocytosis Improved     Hypokalemia Replenished and Improved.   Latest potassium of 4.4.    DVT prophylaxis: enoxaparin (LOVENOX) injection 40 mg Start: 02/22/23 2200   Code Status:     Code Status: Full Code  Disposition: Home likely in 1 to 2 days.  Status is: Inpatient  Remains inpatient appropriate because: Intractable pain, IV narcotics, need for MRI with sedation.   Family Communication: None at bedside  Consultants:  Neurosurgery Infectious disease  Procedures:  None yet  Antimicrobials:  Cefadroxil and doxycycline  Anti-infectives (From admission, onward)    Start     Dose/Rate Route Frequency Ordered Stop   02/22/23 2200  doxycycline (VIBRA-TABS) tablet 100 mg        100 mg Oral Every 12 hours 02/22/23 1509     02/22/23 2200  cefadroxil (DURICEF) capsule 1,000 mg        1,000 mg Oral 2 times daily 02/22/23 1509         Subjective: Today, patient was seen and examined at bedside.  Still complains of very severe pain in the back unable to lie flat.  Inquiring about further pain medications already on multiple medications for pain.    Objective: Vitals:   02/23/23 1938 02/23/23 2303 02/24/23 0304 02/24/23 0802  BP: (!) 150/94 (!) 140/95 (!) 139/92 (!) 149/132  Pulse: 86 (!) 102 (!) 108 78  Resp: 20 20 20 18   Temp: 97.9 F (36.6 C) 98.6 F (37 C) 98.7 F (37.1 C) 98.7 F (37.1 C)  TempSrc: Oral Oral Oral Oral  SpO2:  100% 96%   Weight:      Height:  Intake/Output Summary (Last 24 hours) at 02/24/2023 1059 Last data filed at 02/24/2023 0900 Gross per 24 hour  Intake 360 ml  Output 1900 ml  Net -1540 ml    Filed Weights   02/22/23 0849 02/22/23 1449  Weight: 81.6 kg 79.7 kg    Physical Examination: Body mass index is 25.21 kg/m.   General:  Average built, in mild distress due to pain, sitting propped up in bed.   HENT:   No scleral pallor or icterus noted. Oral mucosa is moist.  Chest:  Clear breath sounds.   No crackles or wheezes.  Tenderness on the back. CVS: S1 &S2 heard. No murmur.  Regular  rate and rhythm. Abdomen: Soft, nontender, nondistended.  Bowel sounds are heard.   Extremities: No cyanosis, clubbing or edema.  Peripheral pulses are palpable. Psych: Alert, awake and oriented, normal mood CNS:  No cranial nerve deficits.  Moving extremities. Skin: Warm and dry.  No rashes noted.  Data Reviewed:   CBC: Recent Labs  Lab 02/22/23 1056 02/23/23 0230 02/24/23 0413  WBC 8.3 7.6 9.7  NEUTROABS 5.4  --   --   HGB 13.7 11.3* 11.3*  HCT 42.5 33.9* 34.9*  MCV 85.9 82.3 84.1  PLT 478* 365 365     Basic Metabolic Panel: Recent Labs  Lab 02/22/23 1056 02/23/23 0230 02/24/23 0413  NA 137 136 132*  K 3.4* 4.1 4.4  CL 97* 98 95*  CO2 25 27 27   GLUCOSE 246* 127* 175*  BUN 12 14 11   CREATININE 0.72 0.81 0.73  CALCIUM 8.9 8.8* 8.7*     Liver Function Tests: Recent Labs  Lab 02/23/23 0230  AST 17  ALT 15  ALKPHOS 85  BILITOT 0.9  PROT 6.0*  ALBUMIN 3.0*      Radiology Studies: No results found.    LOS: 2 days    Joycelyn Das, MD [facility] Available via Epic secure chat 7am-7pm After these hours, please refer to coverage provider listed on [email or web address] 02/24/2023, 10:59 AM

## 2023-02-24 NOTE — Progress Notes (Signed)
[facility]  Date of Admission:  02/22/2023     Total days of antibiotics 3         ASSESSMENT:  John Ware is awaiting MRI to determine any new areas of infection in the setting of thorac discitis/osteomyelitis with phlegmon on oral treatment with Cefadroxil and doxycycline. Continue current dose of Cefadroxil and doxycycline pending MRI results. Remaining medical and supportive care per Internal Medicine.   PLAN:  Continue current dose of Cefadroxil and doxycycline.  Await MRI results Remaining medical and supportive care per Internal Medicine.    Principal Problem:   Vertebral osteomyelitis (HCC) Active Problems:   Hyperlipidemia associated with type 2 diabetes mellitus (HCC)   DM2 (diabetes mellitus, type 2) (HCC)   HTN (hypertension)   Thrombocytosis   Intractable back pain   Hypokalemia    baclofen  5 mg Oral TID   cefadroxil  1,000 mg Oral BID   doxycycline  100 mg Oral Q12H   enoxaparin (LOVENOX) injection  40 mg Subcutaneous Q24H   insulin aspart  0-15 Units Subcutaneous TID WC   lidocaine  1 patch Transdermal Q24H   morphine  30 mg Oral Q12H   oxyCODONE-acetaminophen  1 tablet Oral 5 X Daily   And   oxyCODONE  5 mg Oral 5 X Daily    SUBJECTIVE:  Afebrile overnight with no acute events. Continued back pain.   Allergies  Allergen Reactions   Tizanidine Other (See Comments)    Leg cramps     Review of Systems: Review of Systems  Constitutional:  Negative for chills, fever and weight loss.  Respiratory:  Negative for cough, shortness of breath and wheezing.   Cardiovascular:  Negative for chest pain and leg swelling.  Gastrointestinal:  Negative for abdominal pain, constipation, diarrhea, nausea and vomiting.  Musculoskeletal:  Positive for back pain.  Skin:  Negative for rash.      OBJECTIVE: Vitals:   02/23/23 2303 02/24/23 0304 02/24/23 0802 02/24/23 1119  BP: (!) 140/95 (!) 139/92 (!) 149/132 132/87  Pulse:  (!) 102 (!) 108 78 78  Resp: 20 20 18 20   Temp: 98.6 F (37 C) 98.7 F (37.1 C) 98.7 F (37.1 C) 98.1 F (36.7 C)  TempSrc: Oral Oral Oral Oral  SpO2: 100% 96%  97%  Weight:      Height:       Body mass index is 25.21 kg/m.  Physical Exam Constitutional:      General: He is not in acute distress.    Appearance: He is well-developed.     Comments: Seated on the side of the bed; pleasant.   Cardiovascular:     Rate and Rhythm: Normal rate and regular rhythm.     Heart sounds: Normal heart sounds.  Pulmonary:     Effort: Pulmonary effort is normal.     Breath sounds: Normal breath sounds.  Skin:    General: Skin is warm and dry.  Neurological:     Mental Status: He is alert and oriented to person, place, and time.     Lab Results Lab Results  Component Value Date   WBC 9.7 02/24/2023   HGB 11.3 (L) 02/24/2023   HCT 34.9 (L) 02/24/2023   MCV 84.1 02/24/2023   PLT 365 02/24/2023    Lab Results  Component Value Date   CREATININE 0.73 02/24/2023   BUN 11 02/24/2023   NA 132 (L) 02/24/2023   K 4.4 02/24/2023   CL 95 (  L) 02/24/2023   CO2 27 02/24/2023    Lab Results  Component Value Date   ALT 15 02/23/2023   AST 17 02/23/2023   ALKPHOS 85 02/23/2023   BILITOT 0.9 02/23/2023     Microbiology: Recent Results (from the past 240 hour(s))  Culture, blood (Routine X 2) w Reflex to ID Panel     Status: None (Preliminary result)   Collection Time: 02/23/23  7:38 AM   Specimen: BLOOD  Result Value Ref Range Status   Specimen Description BLOOD SITE NOT SPECIFIED  Final   Special Requests   Final    BOTTLES DRAWN AEROBIC AND ANAEROBIC Blood Culture adequate volume   Culture   Final    NO GROWTH 1 DAY Performed at [facility], [address] [place], [place] [address]    Report Status PENDING  Incomplete  Culture, blood (Routine X 2) w Reflex to ID Panel     Status: None (Preliminary result)   Collection Time: 02/23/23  7:40 AM   Specimen: BLOOD  Result  Value Ref Range Status   Specimen Description BLOOD SITE NOT SPECIFIED  Final   Special Requests   Final    BOTTLES DRAWN AEROBIC AND ANAEROBIC Blood Culture adequate volume   Culture   Final    NO GROWTH 1 DAY Performed at [facility], [address], [place], [place] [address]    Report Status PENDING  Incomplete     Marcos Eke, NP [facility]  02/24/2023  4:01 PM

## 2023-02-24 NOTE — Progress Notes (Signed)
MRI 7/2 at 1300.

## 2023-02-25 ENCOUNTER — Inpatient Hospital Stay (HOSPITAL_COMMUNITY): Payer: Medicare Other

## 2023-02-25 ENCOUNTER — Encounter (HOSPITAL_COMMUNITY): Payer: Self-pay

## 2023-02-25 ENCOUNTER — Inpatient Hospital Stay (HOSPITAL_COMMUNITY): Payer: Medicare Other | Admitting: Certified Registered Nurse Anesthetist

## 2023-02-25 ENCOUNTER — Encounter (HOSPITAL_COMMUNITY)
Admission: EM | Disposition: A | Discharge status: Home Health | Payer: Self-pay | Source: Ambulatory Visit | Attending: Internal Medicine

## 2023-02-25 DIAGNOSIS — G834 Cauda equina syndrome: Secondary | ICD-10-CM | POA: Diagnosis not present

## 2023-02-25 DIAGNOSIS — I5032 Chronic diastolic (congestive) heart failure: Secondary | ICD-10-CM

## 2023-02-25 DIAGNOSIS — E876 Hypokalemia: Secondary | ICD-10-CM | POA: Diagnosis not present

## 2023-02-25 DIAGNOSIS — I110 Hypertensive heart disease with heart failure: Secondary | ICD-10-CM | POA: Diagnosis not present

## 2023-02-25 DIAGNOSIS — E119 Type 2 diabetes mellitus without complications: Secondary | ICD-10-CM | POA: Diagnosis not present

## 2023-02-25 DIAGNOSIS — I10 Essential (primary) hypertension: Secondary | ICD-10-CM | POA: Diagnosis not present

## 2023-02-25 DIAGNOSIS — M4620 Osteomyelitis of vertebra, site unspecified: Secondary | ICD-10-CM | POA: Diagnosis not present

## 2023-02-25 DIAGNOSIS — E1169 Type 2 diabetes mellitus with other specified complication: Secondary | ICD-10-CM | POA: Diagnosis not present

## 2023-02-25 HISTORY — PX: RADIOLOGY WITH ANESTHESIA: SHX6223

## 2023-02-25 LAB — GLUCOSE, CAPILLARY
Glucose-Capillary: 119 mg/dL — ABNORMAL HIGH (ref 70–99)
Glucose-Capillary: 146 mg/dL — ABNORMAL HIGH (ref 70–99)
Glucose-Capillary: 130 mg/dL — ABNORMAL HIGH (ref 70–99)
Glucose-Capillary: 181 mg/dL — ABNORMAL HIGH (ref 70–99)

## 2023-02-25 LAB — CBC
HCT: 34 % — ABNORMAL LOW (ref 39.0–52.0)
Hemoglobin: 11.2 g/dL — ABNORMAL LOW (ref 13.0–17.0)
MCH: 28 pg (ref 26.0–34.0)
MCHC: 32.9 g/dL (ref 30.0–36.0)
MCV: 85 fL (ref 80.0–100.0)
Platelets: 405 10*3/uL — ABNORMAL HIGH (ref 150–400)
RBC: 4 MIL/uL — ABNORMAL LOW (ref 4.22–5.81)
RDW: 15.1 % (ref 11.5–15.5)
WBC: 6.8 10*3/uL (ref 4.0–10.5)
nRBC: 0 % (ref 0.0–0.2)

## 2023-02-25 LAB — BASIC METABOLIC PANEL
Anion gap: 10 (ref 5–15)
BUN: 7 mg/dL (ref 6–20)
CO2: 25 mmol/L (ref 22–32)
Calcium: 8.9 mg/dL (ref 8.9–10.3)
Chloride: 99 mmol/L (ref 98–111)
Creatinine, Ser: 0.6 mg/dL — ABNORMAL LOW (ref 0.61–1.24)
GFR, Estimated: >60 mL/min (ref >60)
Glucose, Bld: 138 mg/dL — ABNORMAL HIGH (ref 70–99)
Potassium: 4.1 mmol/L (ref 3.5–5.1)
Sodium: 134 mmol/L — ABNORMAL LOW (ref 135–145)

## 2023-02-25 LAB — CULTURE, BLOOD (ROUTINE X 2)
Special Requests: ADEQUATE
Special Requests: ADEQUATE

## 2023-02-25 SURGERY — MRI WITH ANESTHESIA
Anesthesia: General

## 2023-02-25 MED ORDER — LACTATED RINGERS IV SOLN: INTRAVENOUS | Status: DC

## 2023-02-25 MED ORDER — MIDAZOLAM HCL 2 MG/2ML IJ SOLN
INTRAMUSCULAR | Status: AC
  Filled 2023-02-25: qty 2

## 2023-02-25 MED ORDER — FENTANYL CITRATE (PF) 100 MCG/2ML IJ SOLN: 25.0000 ug | INTRAMUSCULAR | Status: DC | PRN

## 2023-02-25 MED ORDER — PROPOFOL 500 MG/50ML IV EMUL
INTRAVENOUS | Status: DC | PRN
  Administered 2023-02-25: 200 ug/kg/min via INTRAVENOUS

## 2023-02-25 MED ORDER — CHLORHEXIDINE GLUCONATE 0.12 % MT SOLN
OROMUCOSAL | Status: AC
  Administered 2023-02-25: 15 mL via OROMUCOSAL
  Filled 2023-02-25: qty 15

## 2023-02-25 MED ORDER — PROPOFOL 10 MG/ML IV BOLUS
INTRAVENOUS | Status: DC | PRN
  Administered 2023-02-25: 50 mg via INTRAVENOUS

## 2023-02-25 MED ORDER — ORAL CARE MOUTH RINSE: 15.0000 mL | Freq: Once | OROMUCOSAL | Status: AC

## 2023-02-25 MED ORDER — FENTANYL CITRATE (PF) 250 MCG/5ML IJ SOLN
INTRAMUSCULAR | Status: DC | PRN
  Administered 2023-02-25 (×5): 50 ug via INTRAVENOUS

## 2023-02-25 MED ORDER — CHLORHEXIDINE GLUCONATE 0.12 % MT SOLN: 15.0000 mL | Freq: Once | OROMUCOSAL | Status: AC

## 2023-02-25 MED ORDER — MIDAZOLAM HCL 2 MG/2ML IJ SOLN
INTRAMUSCULAR | Status: DC | PRN
  Administered 2023-02-25: 2 mg via INTRAVENOUS

## 2023-02-25 MED ORDER — LIDOCAINE 2% (20 MG/ML) 5 ML SYRINGE
INTRAMUSCULAR | Status: DC | PRN
  Administered 2023-02-25: 60 mg via INTRAVENOUS

## 2023-02-25 MED ORDER — FENTANYL CITRATE (PF) 250 MCG/5ML IJ SOLN
INTRAMUSCULAR | Status: AC
  Filled 2023-02-25: qty 5

## 2023-02-25 MED ORDER — GADOBUTROL 1 MMOL/ML IV SOLN
8.0000 mL | Freq: Once | INTRAVENOUS | Status: AC | PRN
  Administered 2023-02-25: 8 mL via INTRAVENOUS

## 2023-02-25 NOTE — Progress Notes (Signed)
PROGRESS NOTE    John Ware  WUJ:[id number] DOB: Mar 28, 1966 DOA: 02/22/2023 PCP: John Spencer, FNP    Brief Narrative:   John Ware is a 57 y.o. male with past medical history of depression, type 2 diabetes, hypertension,  spinal stenosis with neurogenic claudication, chronic vertebral osteomyelitis who has been treated recently with cefadroxil and doxycycline, active with neurosurgery and ID follow-up was sent to the ED to get premedicated for thoracic spine MRI.  Unfortunately, patient was unable to do the scan due to extreme pain.  He had been having increasing weakness of his lower extremity.  In the ED, patient was slightly tachycardic with elevated blood pressure at 137/102.  Patient had no leukocytosis.  There was mild hypokalemia at 3.4. The patient and received hydromorphone 2 mg IVP x 1, hydromorphone 1 mg IVP x 1, lorazepam 2 mg IVP x 1, ondansetron 4 mg IVP x 1 and was considered for admission to the hospital for further evaluation.  Assessment and Plan:   Vertebral osteomyelitis with intractable pain. Continue supportive care with analgesics.  Continue cefadroxil doxycycline.  Toradol has been added to his home medication regimen addition to IV Dilaudid, oxycodone, long-acting morphine, baclofen Lidoderm patch,.  Despite the multiple medications he was unable to lie down for MRI so plan for MRI with anesthesia.  no leukocytosis at this time.  Blood cultures negative in one day.  Patient was seen by neurosurgery office [name] and will need to be contacted with MRI report.  Follow ID recommendations.    DM2 (diabetes mellitus, type 2) with hyperglycemia. Latest hemoglobin A1c was 7.7.  Continue sliding scale insulin diabetic diet.  Controlled at this time     Hyperlipidemia associated with type 2 diabetes mellitus (HCC) Currently not on medical therapy.     HTN (hypertension) Focus on pain control.  As needed antihypertensives.  Likely elevated secondary to pain.   Improved today.     Thrombocytosis Improved     Hypokalemia Replenished and Improved.  Latest potassium of 4.1.    DVT prophylaxis: enoxaparin (LOVENOX) injection 40 mg Start: 02/22/23 2200   Code Status:     Code Status: Full Code  Disposition: Home likely in 1 to 2 days.  Status is: Inpatient  Remains inpatient appropriate because: Intractable pain, IV narcotics, need for MRI with anesthesia.   Family Communication: None at bedside  Consultants:  Infectious disease  Procedures:  None yet  Antimicrobials:  Cefadroxil and doxycycline  Anti-infectives (From admission, onward)    Start     Dose/Rate Route Frequency Ordered Stop   02/22/23 2200  doxycycline (VIBRA-TABS) tablet 100 mg        100 mg Oral Every 12 hours 02/22/23 1509     02/22/23 2200  cefadroxil (DURICEF) capsule 1,000 mg        1,000 mg Oral 2 times daily 02/22/23 1509         Subjective: Today, patient was seen and examined at bedside.  Sitting up in the chair.  On multiple medications for pain.  Awaiting for MRI under anesthesia.  Denies any nausea vomiting fever chills or rigor.  Objective: Vitals:   02/24/23 1913 02/24/23 2327 02/25/23 0330 02/25/23 0737  BP: (!) 140/92 (!) 133/92 (!) 157/97 111/79  Pulse: 75 80 70 (!) 55  Resp: 20 20 18 16   Temp: 98.2 F (36.8 C) 98.2 F (36.8 C) 97.9 F (36.6 C) 98 F (36.7 C)  TempSrc: Oral Oral Oral Oral  SpO2:  98% 98%  Weight:      Height:        Intake/Output Summary (Last 24 hours) at 02/25/2023 1058 Last data filed at 02/25/2023 0331 Gross per 24 hour  Intake 240 ml  Output 2101 ml  Net -1861 ml    Filed Weights   02/22/23 0849 02/22/23 1449  Weight: 81.6 kg 79.7 kg    Physical Examination: Body mass index is 25.21 kg/m.   General:  Average built, sitting up in the chair, appears older than stated age. HENT:   No scleral pallor or icterus noted. Oral mucosa is moist.  Chest:  Clear breath sounds.   No crackles or wheezes.   Tenderness on the back. CVS: S1 &S2 heard. No murmur.  Regular rate and rhythm. Abdomen: Soft, nontender, nondistended.  Bowel sounds are heard.   Extremities: No cyanosis, clubbing or edema.  Peripheral pulses are palpable. Psych: Alert, awake and oriented, normal mood CNS:  No cranial nerve deficits.  Moving extremities. Skin: Warm and dry.  No rashes noted.  Data Reviewed:   CBC: Recent Labs  Lab 02/22/23 1056 02/23/23 0230 02/24/23 0413 02/25/23 0425  WBC 8.3 7.6 9.7 6.8  NEUTROABS 5.4  --   --   --   HGB 13.7 11.3* 11.3* 11.2*  HCT 42.5 33.9* 34.9* 34.0*  MCV 85.9 82.3 84.1 85.0  PLT 478* 365 365 405*     Basic Metabolic Panel: Recent Labs  Lab 02/22/23 1056 02/23/23 0230 02/24/23 0413 02/25/23 0425  NA 137 136 132* 134*  K 3.4* 4.1 4.4 4.1  CL 97* 98 95* 99  CO2 25 27 27 25   GLUCOSE 246* 127* 175* 138*  BUN 12 14 11 7   CREATININE 0.72 0.81 0.73 0.60*  CALCIUM 8.9 8.8* 8.7* 8.9     Liver Function Tests: Recent Labs  Lab 02/23/23 0230  AST 17  ALT 15  ALKPHOS 85  BILITOT 0.9  PROT 6.0*  ALBUMIN 3.0*      Radiology Studies: No results found.    LOS: 3 days    Joycelyn Das, MD [facility] Available via Epic secure chat 7am-7pm After these hours, please refer to coverage provider listed on [email or web address] 02/25/2023, 10:58 AM

## 2023-02-25 NOTE — Transfer of Care (Signed)
Immediate Anesthesia Transfer of Care Note  Patient: John Ware  Procedure(s) Performed: MRI WITH ANESTHESIA OF THORACIC AND LUMBAR WITH AND WITHOUT CONTRAST  Patient Location: PACU  Anesthesia Type:MAC  Level of Consciousness: awake, alert , and oriented  Airway & Oxygen Therapy: Patient Spontanous Breathing  Post-op Assessment: Report given to RN and Post -op Vital signs reviewed and stable  Post vital signs: Reviewed and stable  Last Vitals:  Vitals Value Taken Time  BP 149/92 02/25/23 1531  Temp 98   Pulse 62 02/25/23 1533  Resp 14 02/25/23 1533  SpO2 98 % 02/25/23 1533  Vitals shown include unvalidated device data.  Last Pain:  Vitals:   02/25/23 1211  TempSrc:   PainSc: 4          Complications: No notable events documented.

## 2023-02-25 NOTE — Anesthesia Postprocedure Evaluation (Signed)
Anesthesia Post Note  Patient: Kayla L Samson  Procedure(s) Performed: MRI WITH ANESTHESIA OF THORACIC AND LUMBAR WITH AND WITHOUT CONTRAST     Patient location during evaluation: PACU Anesthesia Type: General Level of consciousness: awake and alert Pain management: pain level controlled Vital Signs Assessment: post-procedure vital signs reviewed and stable Respiratory status: spontaneous breathing, nonlabored ventilation and respiratory function stable Cardiovascular status: blood pressure returned to baseline Postop Assessment: no apparent nausea or vomiting Anesthetic complications: no   No notable events documented.  Last Vitals:  Vitals:   02/25/23 1530 02/25/23 1545  BP: (!) 149/92 (!) 138/91  Pulse: 62 60  Resp: 18 10  Temp:  36.6 C  SpO2: 100% 100%    Last Pain:  Vitals:   02/25/23 1545  TempSrc:   PainSc: 3                  Shanda Howells

## 2023-02-25 NOTE — Anesthesia Preprocedure Evaluation (Addendum)
Anesthesia Evaluation  Patient identified by MRN, date of birth, ID band Patient awake    Reviewed: Allergy & Precautions, NPO status , Patient's Chart, lab work & pertinent test results  Airway Mallampati: II  TM Distance: >3 FB Neck ROM: Full    Dental no notable dental hx.    Pulmonary Patient abstained from smoking., former smoker   Pulmonary exam normal        Cardiovascular hypertension, +CHF and + DOE   Rhythm:Regular Rate:Normal  ECHO 2022:  1. Left ventricular ejection fraction, by estimation, is 60 to 65%. The  left ventricle has normal function. The left ventricle has no regional  wall motion abnormalities. There is mild left ventricular hypertrophy.  Left ventricular diastolic parameters  are consistent with Grade I diastolic dysfunction (impaired relaxation).   2. Right ventricular systolic function is normal. The right ventricular  size is normal.   3. The mitral valve is normal in structure. No evidence of mitral valve  regurgitation. No evidence of mitral stenosis.   4. No obvious tricuspid valve vegetation seen (prior TEE vegetation was  apparant).   5. The aortic valve is normal in structure. Aortic valve regurgitation is  not visualized. No aortic stenosis is present.   6. The inferior vena cava is normal in size with greater than 50%  respiratory variability, suggesting right atrial pressure of 3 mmHg.   Conclusion(s)/Recommendation(s): No evidence of valvular vegetations on  this transthoracic echocardiogram. Would recommend a transesophageal  echocardiogram to exclude infective endocarditis if clinically indicated.     Neuro/Psych   Anxiety Depression    negative neurological ROS     GI/Hepatic negative GI ROS, Neg liver ROS,,,  Endo/Other  diabetes    Renal/GU   negative genitourinary   Musculoskeletal  (+) Arthritis , Osteoarthritis,    Abdominal Normal abdominal exam  (+)   Peds   Hematology  (+) Blood dyscrasia, anemia   Anesthesia Other Findings   Reproductive/Obstetrics                             Anesthesia Physical Anesthesia Plan  ASA: 3  Anesthesia Plan: MAC   Post-op Pain Management:    Induction: Intravenous  PONV Risk Score and Plan: 2 and Ondansetron, Midazolam, Treatment may vary due to age or medical condition and Dexamethasone  Airway Management Planned: Simple Face Mask and Nasal Cannula  Additional Equipment: None  Intra-op Plan:   Post-operative Plan:   Informed Consent: I have reviewed the patients History and Physical, chart, labs and discussed the procedure including the risks, benefits and alternatives for the proposed anesthesia with the patient or authorized representative who has indicated his/her understanding and acceptance.     Dental advisory given  Plan Discussed with: CRNA  Anesthesia Plan Comments:        Anesthesia Quick Evaluation

## 2023-02-26 ENCOUNTER — Encounter (HOSPITAL_COMMUNITY): Payer: Self-pay | Admitting: Radiology

## 2023-02-26 DIAGNOSIS — M4620 Osteomyelitis of vertebra, site unspecified: Secondary | ICD-10-CM | POA: Diagnosis not present

## 2023-02-26 DIAGNOSIS — M4624 Osteomyelitis of vertebra, thoracic region: Secondary | ICD-10-CM | POA: Diagnosis not present

## 2023-02-26 DIAGNOSIS — M4644 Discitis, unspecified, thoracic region: Secondary | ICD-10-CM | POA: Diagnosis not present

## 2023-02-26 LAB — GLUCOSE, CAPILLARY: Glucose-Capillary: 149 mg/dL — ABNORMAL HIGH (ref 70–99)

## 2023-02-26 LAB — CULTURE, BLOOD (ROUTINE X 2): Culture: NO GROWTH

## 2023-02-26 MED ORDER — OXYCODONE HCL 5 MG PO TABS: 5.0000 mg | ORAL_TABLET | Freq: Every day | ORAL | Status: DC

## 2023-02-26 MED ORDER — OXYCODONE HCL 5 MG PO TABS
5.0000 mg | ORAL_TABLET | ORAL | Status: DC | PRN
  Administered 2023-02-26 – 2023-02-28 (×9): 5 mg via ORAL
  Filled 2023-02-26 (×9): qty 1

## 2023-02-26 MED ORDER — OXYCODONE HCL 5 MG PO TABS: 5.0000 mg | ORAL_TABLET | Freq: Once | ORAL | Status: DC

## 2023-02-26 MED ORDER — OXYCODONE-ACETAMINOPHEN 5-325 MG PO TABS
1.0000 | ORAL_TABLET | ORAL | Status: DC | PRN
  Administered 2023-02-26 – 2023-02-28 (×11): 1 via ORAL
  Filled 2023-02-26 (×11): qty 1

## 2023-02-26 NOTE — Progress Notes (Signed)
[facility]   Reason for visit: Follow up on vertebral osteomyelitis  Interval History: MRI completed and some progression of the discitis/osteomyelitis on the thoracic spine and a phlegmon with mass effect, particularly at T11-12.     Physical Exam: Constitutional:  Vitals:   02/26/23 0740 02/26/23 1145  BP: (!) 149/88 (!) 143/84  Pulse: 70 75  Resp: 17 15  Temp: 97.6 F (36.4 C) 97.9 F (36.6 C)  SpO2: 100% 97%   patient appears in NAD Respiratory: Normal respiratory effort  Review of Systems: Constitutional: negative for fevers and chills  Lab Results  Component Value Date   WBC 6.8 02/25/2023   HGB 11.2 (L) 02/25/2023   HCT 34.0 (L) 02/25/2023   MCV 85.0 02/25/2023   PLT 405 (H) 02/25/2023    Lab Results  Component Value Date   CREATININE 0.60 (L) 02/25/2023   BUN 7 02/25/2023   NA 134 (L) 02/25/2023   K 4.1 02/25/2023   CL 99 02/25/2023   CO2 25 02/25/2023    Lab Results  Component Value Date   ALT 15 02/23/2023   AST 17 02/23/2023   ALKPHOS 85 02/23/2023     Microbiology: Recent Results (from the past 240 hour(s))  Culture, blood (Routine X 2) w Reflex to ID Panel     Status: None (Preliminary result)   Collection Time: 02/23/23  7:38 AM   Specimen: BLOOD  Result Value Ref Range Status   Specimen Description BLOOD SITE NOT SPECIFIED  Final   Special Requests   Final    BOTTLES DRAWN AEROBIC AND ANAEROBIC Blood Culture adequate volume   Culture   Final    NO GROWTH 3 DAYS Performed at [facility], [address] [place], [place] [address]    Report Status PENDING  Incomplete  Culture, blood (Routine X 2) w Reflex to ID Panel     Status: None (Preliminary result)   Collection Time: 02/23/23  7:40 AM   Specimen: BLOOD  Result Value Ref Range Status   Specimen Description BLOOD SITE NOT SPECIFIED  Final   Special Requests   Final    BOTTLES DRAWN AEROBIC AND ANAEROBIC Blood Culture adequate volume   Culture    Final    NO GROWTH 3 DAYS Performed at [facility], [address], [place], [place] [address]    Report Status PENDING  Incomplete    Impression/Plan:  1. Vertebral osteomyelitis - this has been an ongoing issue with lumbar infection in 2022 with MSSA s/p prolonged treatment and suppression then thoracic infection in September 2023 with Pseudomonas and treated with surgery by [name] and antibiotics with cefepime then cipro and stopped, just continuing with doxycycline. Cipro was restarted in November 2023 with concern for rising inflammatory markers.  He was admitted in April with increased pain aspiration positive for methicillin sensitive Staph epidermidis and treated with 6 weeks of IV cefazolin (doxycycline continued).  Now with worsening pain and MRI findings as above.  Some progression in the discitis/osteomyelitis and phlegmon with significant narrowing and mass effect.   - will need neurosurgery evaluation for the concern of the mass effect - will hold antibiotics for now pending any potential surgical intervention and new cultures -will plan on cefepime IV after any cultures obtained or if no surgery planned to cover MSSE, Pseudomonas, MSSA that could be there from the past.   I have personally spent 65 minutes involved in face-to-face and non-face-to-face  activities for this patient on the day of the visit. Professional time spent includes the following activities: Preparing to see the patient (review of tests), Obtaining and/or reviewing separately obtained history (admission/discharge record), Performing a medically appropriate examination and/or evaluation , Ordering medications/tests/procedures, referring and communicating with other health care professionals, Documenting clinical information in the EMR, Independently interpreting results (not separately reported), Communicating results to the patient/family/caregiver, Counseling and educating the patient/family/caregiver and  Care coordination (not separately reported).

## 2023-02-26 NOTE — Progress Notes (Signed)
John Ware  ZOX:[id number] DOB: Jun 08, 1966 DOA: 02/22/2023 PCP: Junie Spencer, FNP    Brief Narrative:  57 year old with a history of depression, DM2, HTN, spinal stenosis with neurogenic claudication, and chronic vertebral osteomyelitis followed by Neurosurgery and ID who was sent to the ER for premedication prior to a thoracic spine MRI.  He was unable to complete the MRI due to extreme pain.  He was therefore admitted to undergo MRI under anesthesia and for further investigation of worsening lower extremity weakness.  Consultants:  ID Neurosurgery  Goals of Care:  Code Status: Full Code   DVT prophylaxis: Lovenox  Interim Hx: MRI was accomplished under anesthesia 02/25/2023.  Afebrile.  Vital signs stable.  Reports pain in back without significant change today.  No new complaints.  Assessment & Plan:  Chronic vertebral MRSE, MSSA, and Pseudomonas osteomyelitis (since 2022) with acute intractable pain Continue antibiotics as per ID -continue multimodal pain control -MRI of the lumbar and thoracic spine 02/25/2023 noted findings consistent with discitis/osteomyelitis of the lower thoracic spine progressed since prior scan in September 2023 with phlegmon surrounding the thecal sac with moderate narrowing at T10-11 and severe narrowing at T11-12  DM2 with hyperglycemia Most recent A1c 7.7 -CBG reasonably controlled  Hyperlipidemia Not on chronic medical therapy  HTN BP reasonably controlled  Thrombocytosis Stable  Hypokalemia Corrected with supplementation   Family Communication: no family present at time of exam Disposition:  eventual D/C home v/s rehab    Objective: Blood pressure (!) 149/88, pulse 70, temperature 97.6 F (36.4 C), temperature source Oral, resp. rate 17, height 5\' 10"  (1.778 m), weight 81.6 kg, SpO2 100 %.  Intake/Output Summary (Last 24 hours) at 02/26/2023 0829 Last data filed at 02/26/2023 8119 Gross per 24 hour  Intake 980 ml  Output 725 ml   Net 255 ml   Filed Weights   02/22/23 0849 02/22/23 1449 02/25/23 1202  Weight: 81.6 kg 79.7 kg 81.6 kg    Examination: General: No acute respiratory distress Lungs: Clear to auscultation bilaterally without wheezes or crackles Cardiovascular: Regular rate and rhythm without murmur gallop or rub normal S1 and S2 Abdomen: Nontender, nondistended, soft, bowel sounds positive, no rebound, no ascites, no appreciable mass Extremities: No significant cyanosis, clubbing, or edema bilateral lower extremities  CBC: Recent Labs  Lab 02/22/23 1056 02/23/23 0230 02/24/23 0413 02/25/23 0425  WBC 8.3 7.6 9.7 6.8  NEUTROABS 5.4  --   --   --   HGB 13.7 11.3* 11.3* 11.2*  HCT 42.5 33.9* 34.9* 34.0*  MCV 85.9 82.3 84.1 85.0  PLT 478* 365 365 405*   Basic Metabolic Panel: Recent Labs  Lab 02/23/23 0230 02/24/23 0413 02/25/23 0425  NA 136 132* 134*  K 4.1 4.4 4.1  CL 98 95* 99  CO2 27 27 25   GLUCOSE 127* 175* 138*  BUN 14 11 7   CREATININE 0.81 0.73 0.60*  CALCIUM 8.8* 8.7* 8.9   GFR: Estimated Creatinine Clearance: 107.7 mL/min (A) (by C-G formula based on SCr of 0.6 mg/dL (L)).   Scheduled Meds:  baclofen  5 mg Oral TID   cefadroxil  1,000 mg Oral BID   doxycycline  100 mg Oral Q12H   enoxaparin (LOVENOX) injection  40 mg Subcutaneous Q24H   insulin aspart  0-15 Units Subcutaneous TID WC   lidocaine  1 patch Transdermal Q24H   morphine  30 mg Oral Q12H   oxyCODONE-acetaminophen  1 tablet Oral 5 X Daily   And  oxyCODONE  5 mg Oral 5 X Daily     LOS: 4 days   Lonia Blood, MD [facility] Office  [phone] Pager - Text Page per Amion  If 7PM-7AM, please contact night-coverage per Amion 02/26/2023, 8:29 AM

## 2023-02-27 DIAGNOSIS — M4620 Osteomyelitis of vertebra, site unspecified: Secondary | ICD-10-CM | POA: Diagnosis not present

## 2023-02-27 DIAGNOSIS — E1169 Type 2 diabetes mellitus with other specified complication: Secondary | ICD-10-CM | POA: Diagnosis not present

## 2023-02-27 LAB — CBC
HCT: 34.3 % — ABNORMAL LOW (ref 39.0–52.0)
Hemoglobin: 11.1 g/dL — ABNORMAL LOW (ref 13.0–17.0)
MCH: 26.7 pg (ref 26.0–34.0)
MCHC: 32.4 g/dL (ref 30.0–36.0)
MCV: 82.5 fL (ref 80.0–100.0)
Platelets: 424 10*3/uL — ABNORMAL HIGH (ref 150–400)
RBC: 4.16 MIL/uL — ABNORMAL LOW (ref 4.22–5.81)
RDW: 15.1 % (ref 11.5–15.5)
WBC: 7 10*3/uL (ref 4.0–10.5)
nRBC: 0 % (ref 0.0–0.2)

## 2023-02-27 LAB — BASIC METABOLIC PANEL
Anion gap: 10 (ref 5–15)
BUN: 7 mg/dL (ref 6–20)
CO2: 26 mmol/L (ref 22–32)
Calcium: 9 mg/dL (ref 8.9–10.3)
Chloride: 100 mmol/L (ref 98–111)
Creatinine, Ser: 0.75 mg/dL (ref 0.61–1.24)
GFR, Estimated: >60 mL/min (ref >60)
Glucose, Bld: 208 mg/dL — ABNORMAL HIGH (ref 70–99)
Potassium: 3.8 mmol/L (ref 3.5–5.1)
Sodium: 136 mmol/L (ref 135–145)

## 2023-02-27 LAB — CULTURE, BLOOD (ROUTINE X 2): Culture: NO GROWTH

## 2023-02-27 LAB — SEDIMENTATION RATE: Sed Rate: 30 mm/hr — ABNORMAL HIGH (ref 0–16)

## 2023-02-27 LAB — C-REACTIVE PROTEIN: CRP: 2.4 mg/dL — ABNORMAL HIGH (ref <1.0)

## 2023-02-27 NOTE — Progress Notes (Signed)
2255 Pt. BP 182/110 manual, pt. pain level elevated, RN gave PRN Toradol, and reached out to Corinda Gubler, hospitalist. She told RN to wait an hour for pain meds to kick in and then recheck BP.   2346 pt. BP 147/98 (113) pain still elevated. RN gave PRN Percocet. Chavez aware and  encouraged RN to recheck once pain more controlled.

## 2023-02-27 NOTE — Progress Notes (Signed)
Patient is refusing finger sticks for CBG's. MD is aware.

## 2023-02-27 NOTE — Progress Notes (Signed)
John Ware  ZOX:[id number] DOB: 18-Jan-1966 DOA: 02/22/2023 PCP: Junie Spencer, FNP    Brief Narrative:  57 year old with a history of depression, DM2, HTN, spinal stenosis with neurogenic claudication, and chronic vertebral osteomyelitis followed by Neurosurgery and ID who was sent to the ER for premedication prior to a thoracic spine MRI.  He was unable to complete the MRI due to extreme pain.  He was therefore admitted to undergo MRI under anesthesia and for further investigation of worsening lower extremity weakness.  Consultants:  ID Neurosurgery  Goals of Care:  Code Status: Full Code   DVT prophylaxis: Lovenox  Interim Hx: Patient is refusing CBG checks.  No other acute events recorded overnight.  Afebrile.  Vital signs stable.  Pain persists but is reasonably manageable at present.  No new complaints today.  Patient spoke with the neurosurgeon in consultation this morning and voices understanding of the current plan.  Assessment & Plan:  Chronic vertebral MRSE, MSSA, and Pseudomonas osteomyelitis (since 2022) with acute intractable pain continue multimodal pain control - MRI of the lumbar and thoracic spine 02/25/2023 noted findings consistent with discitis/osteomyelitis of the lower thoracic spine progressed since prior scan in September 2023 with phlegmon surrounding the thecal sac with moderate narrowing at T10-11 and severe narrowing at T11-12 - NS has evaluated and feel that surgical intervention would not be helpful at present - will discuss CT guided aspiration w/ ID, v/s empiric abx   DM2 with hyperglycemia Most recent A1c 7.7 -CBG remains reasonably controlled at present - appears to use continuous glucose monitor at home   Hyperlipidemia Not on chronic medical therapy  HTN BP reasonably controlled -follow without change today  Thrombocytosis Stable  Hypokalemia Corrected with supplementation   Family Communication: no family present at time of  exam Disposition: eventual D/C home v/s rehab    Objective: Blood pressure (!) 152/95, pulse (!) 56, temperature 97.9 F (36.6 C), temperature source Oral, resp. rate 16, height 5\' 10"  (1.778 m), weight 81.6 kg, SpO2 96 %.  Intake/Output Summary (Last 24 hours) at 02/27/2023 0834 Last data filed at 02/27/2023 0720 Gross per 24 hour  Intake 600 ml  Output 1130 ml  Net -530 ml    Filed Weights   02/22/23 0849 02/22/23 1449 02/25/23 1202  Weight: 81.6 kg 79.7 kg 81.6 kg    Examination: General: No acute respiratory distress Lungs: Clear to auscultation bilaterally  Cardiovascular: Regular rate and rhythm  Abdomen: Nontender, nondistended, soft, bowel sounds positive Extremities: 1+ edema B LE w/o worrisome characteristics   CBC: Recent Labs  Lab 02/22/23 1056 02/23/23 0230 02/24/23 0413 02/25/23 0425 02/27/23 0411  WBC 8.3   < > 9.7 6.8 7.0  NEUTROABS 5.4  --   --   --   --   HGB 13.7   < > 11.3* 11.2* 11.1*  HCT 42.5   < > 34.9* 34.0* 34.3*  MCV 85.9   < > 84.1 85.0 82.5  PLT 478*   < > 365 405* 424*   < > = values in this interval not displayed.    Basic Metabolic Panel: Recent Labs  Lab 02/24/23 0413 02/25/23 0425 02/27/23 0411  NA 132* 134* 136  K 4.4 4.1 3.8  CL 95* 99 100  CO2 27 25 26   GLUCOSE 175* 138* 208*  BUN 11 7 7   CREATININE 0.73 0.60* 0.75  CALCIUM 8.7* 8.9 9.0    GFR: Estimated Creatinine Clearance: 107.7 mL/min (by C-G formula based on  SCr of 0.75 mg/dL).   Scheduled Meds:  baclofen  5 mg Oral TID   enoxaparin (LOVENOX) injection  40 mg Subcutaneous Q24H   insulin aspart  0-15 Units Subcutaneous TID WC   lidocaine  1 patch Transdermal Q24H   morphine  30 mg Oral Q12H   oxyCODONE  5 mg Oral Once     LOS: 5 days   Lonia Blood, MD [facility] Office  [phone] Pager - Text Page per Loretha Stapler  If 7PM-7AM, please contact night-coverage per Amion 02/27/2023, 8:34 AM

## 2023-02-27 NOTE — Progress Notes (Incomplete)
2315 Pt. BP 182/110 manual, pt. pain level elevated, RN gave PRN Toradol, and reached out to Corinda Gubler, hospitalist. She told RN to wait an hour for pain meds to kick in and then recheck BP.

## 2023-02-27 NOTE — Consult Note (Signed)
Chief Complaint   Chief Complaint  Patient presents with   Back Pain    History of Present Illness  John Ware is a 57 y.o. male presenting to the hospital with worsening back pain and worsening chronic leg weakness over the past month.  Patient known to my partner, [name] with multiple previous surgeries initially with lumbar fusion complicated over the last 2 years by multiple levels of spinal osteomyelitis/discitis and multiple previous lumbar and thoracic laminectomy for decompression of epidural abscess.  Patient was apparently seen in our office a week or so ago with complaint of worsening back pain, persistent lower extremity numbness since his previous surgery in September 2023, and 1 month of subjective worsening leg weakness.  Patient states he feels very off balance and has required the use of a walker over the last 4 weeks.  He was sent to the emergency department where MRI was completed under anesthesia and revealed new osteomyelitis discitis at T10-11 with perhaps a component of epidural phlegmon.  Neurosurgical consultation was therefore requested.  Past Medical History   Past Medical History:  Diagnosis Date   Depression    Diabetes mellitus without complication (HCC)    Hypertension    Spinal stenosis    With Neurogenic Claudication    Past Surgical History   Past Surgical History:  Procedure Laterality Date   CARPAL TUNNEL RELEASE Bilateral    INCISION AND DRAINAGE ABSCESS Left 03/12/2021   Procedure: INCISION AND DRAINAGE ABSCESS;  Surgeon: [name], MD;  Location: [facility];  Service: Orthopedics;  Laterality: Left;   IR FLUORO GUIDED NEEDLE PLC ASPIRATION/INJECTION LOC  04/24/2022   IR FLUORO GUIDED NEEDLE PLC ASPIRATION/INJECTION LOC  12/05/2022   IR US GUIDE BX ASP/DRAIN  02/02/2021   IRRIGATION AND DEBRIDEMENT ABSCESS Left 02/08/2021   Procedure: MINOR INCISION AND DRAINAGE OF ABSCESS;  Surgeon: [name], MD;  Location: [facility];  Service:  Orthopedics;  Laterality: Left;   IRRIGATION AND DEBRIDEMENT ABSCESS Left 02/10/2021   Procedure: IRRIGATION AND DEBRIDEMENT ABSCESS LEFT LATERAL CHEST WALL;  Surgeon: [name], MD;  Location: [facility];  Service: General;  Laterality: Left;   IRRIGATION AND DEBRIDEMENT ABSCESS Bilateral 02/20/2021   Procedure: MINOR INCISION AND DRAINAGE OF ABSCESS/WITH BILATERAL WOUND VAC PLACEMENT;  Surgeon: [name], MD;  Location: [facility];  Service: Orthopedics;  Laterality: Bilateral;   IRRIGATION AND DEBRIDEMENT SHOULDER Bilateral 02/28/2021   Procedure: IRRIGATION AND DEBRIDEMENT SHOULDER;  Surgeon: [name], MD;  Location: [facility];  Service: Orthopedics;  Laterality: Bilateral;   IRRIGATION AND DEBRIDEMENT SHOULDER Left 03/06/2021   Procedure: IRRIGATION AND DEBRIDEMENT SHOULDER;  Surgeon: [name], MD;  Location: [facility];  Service: Orthopedics;  Laterality: Left;   IRRIGATION AND DEBRIDEMENT SHOULDER Left 03/08/2021   Procedure: IRRIGATION AND DEBRIDEMENT SHOULDER;  Surgeon: [name], MD;  Location: [facility];  Service: Orthopedics;  Laterality: Left;   LUMBAR LAMINECTOMY FOR EPIDURAL ABSCESS N/A 05/02/2022   Procedure: THORACIC  LAMINECTOMY AND DEBRIDEMENT  FOR EPIDURAL ABSCESS;  Surgeon: [name], MD;  Location: [facility];  Service: Neurosurgery;  Laterality: N/A;   LUMBAR LAMINECTOMY/DECOMPRESSION MICRODISCECTOMY N/A 12/13/2014   Procedure: LUMBAR THREE-FOUR, LUMBAR FOUR-FIVE, LUMBAR FIVE-SACRAL ONE LAMINECTOMY;  Surgeon: [name], MD;  Location: [facility];  Service: Neurosurgery;  Laterality: N/A;  L3-4 L4-5 L5-S1 Laminectomy   LUMBAR LAMINECTOMY/DECOMPRESSION MICRODISCECTOMY Left 06/05/2021   Procedure: Left lumbar one-two, Lumbar two-three Laminectomy;  Surgeon: [name], MD;  [facility];  Service: Neurosurgery;  Laterality: Left;   MENISCUS REPAIR     Right knee   MINOR APPLICATION OF WOUND VAC Left 02/20/2021   Procedure: MINOR APPLICATION OF KERLIX PACKING/REMOVAL  OF WOUND VAC;  Surgeon: [name], MD;  Location: [facility];  Service: General;  Laterality: Left;   RADIOLOGY WITH ANESTHESIA N/A 02/25/2023   Procedure: MRI WITH ANESTHESIA OF THORACIC AND LUMBAR WITH AND WITHOUT CONTRAST;  Surgeon: Radiologist, Medication, MD;  Location: [facility];  Service: Radiology;  Laterality: N/A;   TEE WITHOUT CARDIOVERSION N/A 02/05/2021   Procedure: TRANSESOPHAGEAL ECHOCARDIOGRAM (TEE);  Surgeon: Quintella Reichert, MD;  Location: [facility];  Service: Cardiovascular;  Laterality: N/A;   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   TOTAL SHOULDER ARTHROPLASTY Right 02/10/2021   Procedure: RIGHT OPEN SHOULDER I & D, OPEN DISTAL CLAVICLE RESECTION;  Surgeon: [name], MD;  Location: [facility];  Service: Orthopedics;  Laterality: Right;   VASECTOMY      Social History   Social History   Tobacco Use   Smoking status: Former    Packs/day: 2.00    Years: 15.00    Additional pack years: 0.00    Total pack years: 30.00    Types: Cigarettes    Quit date: 2012    Years since quitting: 12.5    Passive exposure: Never   Smokeless tobacco: Never  Vaping Use   Vaping Use: Never used  Substance Use Topics   Alcohol use: Yes    Comment: occasional   Drug use: No    Medications   Prior to Admission medications   Medication Sig Start Date End Date Taking? Authorizing Provider  acetaminophen (TYLENOL) 500 MG tablet Take 1 tablet (500 mg total) by mouth every 6 (six) hours as needed. 06/26/21  Yes Love, Evlyn Kanner, PA-C  Baclofen 5 MG TABS Take 1 tablet (5 mg total) by mouth 3 (three) times daily. 11/13/22  Yes   cefadroxil (DURICEF) 500 MG capsule Take 2 capsules (1,000 mg total) by mouth 2 (two) times daily. 01/30/23  Yes Odette Fraction, MD  doxycycline (VIBRA-TABS) 100 MG tablet Take 1 tablet (100 mg total) by mouth every 12 (twelve) hours. 12/09/22 12/04/23 Yes Dorcas Carrow, MD  gabapentin (NEURONTIN) 400 MG capsule Take 1 capsule (400 mg total) by mouth 2  (two) times daily. 08/16/22  Yes Hawks, Christy A, FNP  hydrOXYzine (ATARAX) 10 MG tablet Take 1 tablet (10 mg total) by mouth 3 (three) times daily. 02/05/23  Yes   morphine (KADIAN) 30 MG 24 hr capsule Take 1 capsule (30 mg total) by mouth every 12 (twelve) hours. 02/05/23  Yes   naloxone (NARCAN) 0.4 MG/ML injection Inject 1 mL (0.4 mg total) into the vein as needed. 06/26/21  Yes Love, Evlyn Kanner, PA-C  oxyCODONE-acetaminophen (PERCOCET) 10-325 MG tablet Take 1 tablet by mouth 5 times daily. 02/05/23  Yes   Baclofen 5 MG TABS Take 1 tablet (5 mg total) by mouth 3 (three) times daily. 02/05/23     Continuous Blood Gluc Sensor (FREESTYLE LIBRE 2 SENSOR) MISC CHECK BLOOD SUGAR 4 TIMES A DAY 08/22/22   Jannifer Rodney A, FNP    Allergies   Allergies  Allergen Reactions   Tizanidine Other (See Comments)    Leg cramps    Review of Systems  ROS  Neurologic Exam  Awake, alert, oriented Memory and concentration grossly intact Speech fluent, appropriate CN grossly intact Motor exam: Upper Extremities Deltoid Bicep Tricep Grip  Right 5/5 5/5  5/5 5/5  Left 5/5 5/5 5/5 5/5   Lower Extremities IP Quad PF DF EHL  Right 4+/5 pain limited 5/5 5/5 5/5 5/5  Left 4+/5 pain limited 5/5 5/5 5/5 5/5   Sensation grossly intact to LT  Imaging  MRI thoracic spine was personally reviewed.  This demonstrates the prior laminectomy at T10-11, with progression of osteomyelitis and discitis at T11-12.  There is a thin component of epidural phlegmon behind the T11 vertebral body.  There is associated stenosis of the spinal canal.  I do not see any signal change within the spinal cord.  Impression  - 57 y.o. 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  Patient presents again with 1 month worsening back pain and subjective leg weakness although physical exam reveals good strength on confrontation testing.  His  thoracic MRI again reveals worsening discitis osteomyelitis with a component of epidural phlegmon around the T11-12 region.    Operative decompression at this level with pathology ventral to the spinal cord would require unilateral but likely bilateral facetectomy in order to access the ventral space.  Furthermore, he could possibly also require anterior column reconstruction.  Clinically, the patient appears to be doing quite well neurologically, with worsening pain.  He has also undergone multiple previous surgeries for epidural abscess with continued/persistent infection.  I therefore do not see a role for further surgical intervention at this point, certainly not on an acute basis.  Plan  -If repeat microbiologic diagnosis is needed, can consider CT-guided thoracic disc aspiration -Continue antibiotics per ID -can start physical and Occupational Therapy   Lisbeth Renshaw, MD [facility]

## 2023-02-27 NOTE — Progress Notes (Signed)
Subjective: No new complaints, cannot sleep lying on his back and has to sleep sitting up apparently   Antibiotics:  Anti-infectives (From admission, onward)    Start     Dose/Rate Route Frequency Ordered Stop   02/22/23 2200  doxycycline (VIBRA-TABS) tablet 100 mg  Status:  Discontinued        100 mg Oral Every 12 hours 02/22/23 1509 02/26/23 0908   02/22/23 2200  cefadroxil (DURICEF) capsule 1,000 mg  Status:  Discontinued        1,000 mg Oral 2 times daily 02/22/23 1509 02/26/23 0908       Medications: Scheduled Meds:  baclofen  5 mg Oral TID   enoxaparin (LOVENOX) injection  40 mg Subcutaneous Q24H   insulin aspart  0-15 Units Subcutaneous TID WC   lidocaine  1 patch Transdermal Q24H   morphine  30 mg Oral Q12H   oxyCODONE  5 mg Oral Once   Continuous Infusions: PRN Meds:.acetaminophen **OR** [DISCONTINUED] acetaminophen, HYDROmorphone (DILAUDID) injection, ketorolac, ondansetron **OR** ondansetron (ZOFRAN) IV, oxyCODONE, oxyCODONE-acetaminophen **AND** [DISCONTINUED] oxyCODONE    Objective: Weight change:   Intake/Output Summary (Last 24 hours) at 02/27/2023 1022 Last data filed at 02/27/2023 0720 Gross per 24 hour  Intake 600 ml  Output 1130 ml  Net -530 ml   Blood pressure (!) 152/95, pulse (!) 56, temperature 97.9 F (36.6 C), temperature source Oral, resp. rate 16, height 5\' 10"  (1.778 m), weight 81.6 kg, SpO2 96 %. Temp:  [97.7 F (36.5 C)-98.2 F (36.8 C)] 97.9 F (36.6 C) (07/04 0720) Pulse Rate:  [56-75] 56 (07/04 0720) Resp:  [14-17] 16 (07/04 0301) BP: (132-152)/(80-96) 152/95 (07/04 0720) SpO2:  [96 %-100 %] 96 % (07/04 0301)  Physical Exam: Physical Exam Constitutional:      Appearance: He is well-developed.  HENT:     Head: Normocephalic and atraumatic.  Eyes:     Conjunctiva/sclera: Conjunctivae normal.  Cardiovascular:     Rate and Rhythm: Normal rate and regular rhythm.  Pulmonary:     Effort: Pulmonary effort is normal. No  respiratory distress.     Breath sounds: No stridor. No wheezing.  Abdominal:     General: There is no distension.  Musculoskeletal:        General: Normal range of motion.     Cervical back: Normal range of motion and neck supple.  Skin:    General: Skin is warm and dry.     Findings: No erythema or rash.  Neurological:     General: No focal deficit present.     Mental Status: He is alert and oriented to person, place, and time.  Psychiatric:        Mood and Affect: Mood normal.        Behavior: Behavior normal.        Thought Content: Thought content normal.        Judgment: Judgment normal.      CBC:    BMET Recent Labs    02/25/23 0425 02/27/23 0411  NA 134* 136  K 4.1 3.8  CL 99 100  CO2 25 26  GLUCOSE 138* 208*  BUN 7 7  CREATININE 0.60* 0.75  CALCIUM 8.9 9.0     Liver Panel  No results for input(s): "PROT", "ALBUMIN", "AST", "ALT", "ALKPHOS", "BILITOT", "BILIDIR", "IBILI" in the last 72 hours.     Sedimentation Rate Recent Labs    02/27/23 0411  ESRSEDRATE 30*   C-Reactive Protein Recent  Labs    02/27/23 0411  CRP 2.4*    Micro Results: Recent Results (from the past 720 hour(s))  Culture, blood (Routine X 2) w Reflex to ID Panel     Status: None (Preliminary result)   Collection Time: 02/23/23  7:38 AM   Specimen: BLOOD  Result Value Ref Range Status   Specimen Description BLOOD SITE NOT SPECIFIED  Final   Special Requests   Final    BOTTLES DRAWN AEROBIC AND ANAEROBIC Blood Culture adequate volume   Culture   Final    NO GROWTH 3 DAYS Performed at [facility], [address], [place], [place] [address]    Report Status PENDING  Incomplete  Culture, blood (Routine X 2) w Reflex to ID Panel     Status: None (Preliminary result)   Collection Time: 02/23/23  7:40 AM   Specimen: BLOOD  Result Value Ref Range Status   Specimen Description BLOOD SITE NOT SPECIFIED  Final   Special Requests   Final    BOTTLES DRAWN AEROBIC AND  ANAEROBIC Blood Culture adequate volume   Culture   Final    NO GROWTH 3 DAYS Performed at [facility], [address], [place], [place] [address]    Report Status PENDING  Incomplete    Studies/Results: MR Lumbar Spine W Wo Contrast  Result Date: 02/25/2023 CLINICAL DATA:  Vertebral osteomyelitis intractable back pain. EXAM: MRI THORACIC AND LUMBAR SPINE WITHOUT AND WITH CONTRAST TECHNIQUE: Multiplanar and multiecho pulse sequences of the thoracic and lumbar spine were obtained without and with intravenous contrast. CONTRAST:  8mL GADAVIST GADOBUTROL 1 MMOL/ML IV SOLN COMPARISON:  MRI lumbar spine Jan 22, 2023. MRI thoracic spine May 01, 2022. FINDINGS: MRI THORACIC SPINE FINDINGS Alignment:  Mildly exaggerated thoracic kyphosis. Vertebrae: Marrow edema involving the T9 through T12 vertebral bodies with edema and contrast enhancement of the corresponding intervertebral discs with endplate erosion at T10-11 and most notably at T11-12 consistent with discitis/osteomyelitis. This has progressed since prior thoracic spine MRI, particularly at the T11-12 level. Associated epidural phlegmon extending from the T9 through the L1 level measuring up to 6 mm anteriorly at the level of the T10-11 and T11-12 disc spaces, compared to 4 mm on prior MRI of the thoracic spine. There is also a prominent prevertebral phlegmon at these levels. No well-defined drainable collection identified. Cord:  Normal signal characteristics. Paraspinal and other soft tissues: As above. Disc levels: T1-2: Facet degenerative changes resulting in moderate bilateral neural foraminal narrowing. No spinal canal stenosis. T2-3: Facet degenerative changes resulting in moderate bilateral neural foraminal narrowing. No spinal canal stenosis. T3-4: Diffuse the level facet degenerative changes with moderate bilateral neural foraminal narrowing. No spinal canal stenosis. T4-5: Posterior disc protrusion causing mass effect on the cord and  resulting in mild spinal canal stenosis. No significant neural foraminal narrowing. T5-6: Right posterolateral disc protrusion causing mass effect on the cord and resulting in moderate spinal canal stenosis. No neural foraminal narrowing. T6-7: No significant spinal canal or neural foraminal stenosis. T7-8: No significant spinal canal or neural foraminal stenosis. T8-9: Central disc protrusion causing mass effect on the cord and resulting in mild spinal canal stenosis. No neural foraminal narrowing. T9-10: Left posterolateral disc protrusion resulting in mild spinal canal stenosis and moderate left neural foraminal narrowing. T10-11: Postsurgical changes from laminectomy. Epidural phlegmon surrounding the thecal sac and resulting moderate narrowing of the thecal sac. Severe bilateral neural foraminal narrowing T11-12: Phlegmon surrounding the thecal sac results in  severe narrowing of the thecal sac with mass effect on the cord (series 22, image 34). Severe bilateral neural foraminal narrowing. T12-L1: Right posterolateral disc protrusion and moderate facet degenerative changes as well as epidural phlegmon resulting in mild spinal canal stenosis and severe right neural foraminal narrowing. MRI LUMBAR SPINE FINDINGS Segmentation: Transitional lumbosacral anatomy related to partial lumbarization of L1. Alignment: Grade 1 anterolisthesis of L5 over S1 status post fusion. Vertebrae: No significant signal changes in the lumbar vertebrae with stable T2 hyperintensity of the L1-2 and L2-3 intervertebral discs. No new signal abnormality. Postsurgical changes from laminectomy and fusion from L3 through S1. Stable mild wedging of the L2 vertebral body. Conus medullaris: Extends to the T12-L1 level. Signal characteristics appear maintained. Paraspinal and other soft tissues: Ventral epidural fluid collection at the L1 level is no longer seen. Posterior soft tissues septated fluid collection at the level of the L4-5  laminectomies measuring approximately 5.6 x 3.4 x 1.8 cm is unchanged. Disc levels: L1-2: Partial disc space fusion, moderate facet degenerative changes, moderate bilateral neural foraminal narrowing. No significant spinal canal stenosis. L2-3: Prominent hypertrophic facet degenerative changes and moderate bilateral neural foraminal narrowing. No significant spinal canal stenosis. L3-4: No significant spinal canal or neural foraminal stenosis. L4-5: No significant spinal canal or neural foraminal stenosis. L5-S1: Moderate left neural foraminal narrowing. No significant spinal canal stenosis. IMPRESSION: 1. Findings consistent with discitis/osteomyelitis of the lower thoracic spine, progressed since prior thoracic MRI (September 2023). The T11-12 level, which was included on prior MRI of the lumbar spine (May 2024) appear stable. 2. Phlegmon surrounding the thecal sac results in moderate narrowing at T10-11 and severe at T11-12, resulting in mass effect on the cord without cord signal abnormality. 3. Interval resolution of ventral epidural fluid collection at the L1 level. 4. Degenerative changes of the thoracic spine with moderate spinal canal stenosis at T5-6. 5. Degenerative changes of the lumbar spine with moderate bilateral neural foraminal narrowing at L1-2 and L2-3 and moderate left neural foraminal narrowing at L5-S1. Electronically Signed   By: Baldemar Lenis M.D.   On: 02/25/2023 16:54   MR THORACIC SPINE W WO CONTRAST  Result Date: 02/25/2023 CLINICAL DATA:  Vertebral osteomyelitis intractable back pain. EXAM: MRI THORACIC AND LUMBAR SPINE WITHOUT AND WITH CONTRAST TECHNIQUE: Multiplanar and multiecho pulse sequences of the thoracic and lumbar spine were obtained without and with intravenous contrast. CONTRAST:  8mL GADAVIST GADOBUTROL 1 MMOL/ML IV SOLN COMPARISON:  MRI lumbar spine Jan 22, 2023. MRI thoracic spine May 01, 2022. FINDINGS: MRI THORACIC SPINE FINDINGS Alignment:   Mildly exaggerated thoracic kyphosis. Vertebrae: Marrow edema involving the T9 through T12 vertebral bodies with edema and contrast enhancement of the corresponding intervertebral discs with endplate erosion at T10-11 and most notably at T11-12 consistent with discitis/osteomyelitis. This has progressed since prior thoracic spine MRI, particularly at the T11-12 level. Associated epidural phlegmon extending from the T9 through the L1 level measuring up to 6 mm anteriorly at the level of the T10-11 and T11-12 disc spaces, compared to 4 mm on prior MRI of the thoracic spine. There is also a prominent prevertebral phlegmon at these levels. No well-defined drainable collection identified. Cord:  Normal signal characteristics. Paraspinal and other soft tissues: As above. Disc levels: T1-2: Facet degenerative changes resulting in moderate bilateral neural foraminal narrowing. No spinal canal stenosis. T2-3: Facet degenerative changes resulting in moderate bilateral neural foraminal narrowing. No spinal canal stenosis. T3-4: Diffuse the level facet degenerative changes with moderate bilateral  neural foraminal narrowing. No spinal canal stenosis. T4-5: Posterior disc protrusion causing mass effect on the cord and resulting in mild spinal canal stenosis. No significant neural foraminal narrowing. T5-6: Right posterolateral disc protrusion causing mass effect on the cord and resulting in moderate spinal canal stenosis. No neural foraminal narrowing. T6-7: No significant spinal canal or neural foraminal stenosis. T7-8: No significant spinal canal or neural foraminal stenosis. T8-9: Central disc protrusion causing mass effect on the cord and resulting in mild spinal canal stenosis. No neural foraminal narrowing. T9-10: Left posterolateral disc protrusion resulting in mild spinal canal stenosis and moderate left neural foraminal narrowing. T10-11: Postsurgical changes from laminectomy. Epidural phlegmon surrounding the thecal sac  and resulting moderate narrowing of the thecal sac. Severe bilateral neural foraminal narrowing T11-12: Phlegmon surrounding the thecal sac results in severe narrowing of the thecal sac with mass effect on the cord (series 22, image 34). Severe bilateral neural foraminal narrowing. T12-L1: Right posterolateral disc protrusion and moderate facet degenerative changes as well as epidural phlegmon resulting in mild spinal canal stenosis and severe right neural foraminal narrowing. MRI LUMBAR SPINE FINDINGS Segmentation: Transitional lumbosacral anatomy related to partial lumbarization of L1. Alignment: Grade 1 anterolisthesis of L5 over S1 status post fusion. Vertebrae: No significant signal changes in the lumbar vertebrae with stable T2 hyperintensity of the L1-2 and L2-3 intervertebral discs. No new signal abnormality. Postsurgical changes from laminectomy and fusion from L3 through S1. Stable mild wedging of the L2 vertebral body. Conus medullaris: Extends to the T12-L1 level. Signal characteristics appear maintained. Paraspinal and other soft tissues: Ventral epidural fluid collection at the L1 level is no longer seen. Posterior soft tissues septated fluid collection at the level of the L4-5 laminectomies measuring approximately 5.6 x 3.4 x 1.8 cm is unchanged. Disc levels: L1-2: Partial disc space fusion, moderate facet degenerative changes, moderate bilateral neural foraminal narrowing. No significant spinal canal stenosis. L2-3: Prominent hypertrophic facet degenerative changes and moderate bilateral neural foraminal narrowing. No significant spinal canal stenosis. L3-4: No significant spinal canal or neural foraminal stenosis. L4-5: No significant spinal canal or neural foraminal stenosis. L5-S1: Moderate left neural foraminal narrowing. No significant spinal canal stenosis. IMPRESSION: 1. Findings consistent with discitis/osteomyelitis of the lower thoracic spine, progressed since prior thoracic MRI (September  2023). The T11-12 level, which was included on prior MRI of the lumbar spine (May 2024) appear stable. 2. Phlegmon surrounding the thecal sac results in moderate narrowing at T10-11 and severe at T11-12, resulting in mass effect on the cord without cord signal abnormality. 3. Interval resolution of ventral epidural fluid collection at the L1 level. 4. Degenerative changes of the thoracic spine with moderate spinal canal stenosis at T5-6. 5. Degenerative changes of the lumbar spine with moderate bilateral neural foraminal narrowing at L1-2 and L2-3 and moderate left neural foraminal narrowing at L5-S1. Electronically Signed   By: Baldemar Lenis M.D.   On: 02/25/2023 16:54      Assessment/Plan:  INTERVAL HISTORY Holding antibiotics and awaiting neurosurgery   Principal Problem:   Vertebral osteomyelitis (HCC) Active Problems:   Hyperlipidemia associated with type 2 diabetes mellitus (HCC)   DM2 (diabetes mellitus, type 2) (HCC)   HTN (hypertension)   Thrombocytosis   Intractable back pain   Hypokalemia    John Ware is a 57 y.o. male with vertebral osteomyelitis with history of lumbar infection 2022 with MSSA status post prolonged treatment at suppression then thoracic infection in September 2023 with Pseudomonas treated with surgery  and antibiotics with cefepime and then ciprofloxacin which was later stopped with continuation of doxycycline.  Ciprofloxacin restarted November 2023 due to concern of rising inflammatory markers.  He had admitted in April increased pain and aspiration positive for methicillin sensitive Staphylococcus epidermidis status post 6 weeks of cefazolin with continuation of doxycycline.  He now has worsening pain and MRI findings showing progression of discitis osteomyelitis and phlegmon.  Antibiotics are being held in anticipation of neurosurgical evaluation and potential intervention.  If he does go for surgery and we get cultures from the operating  room would start cefepime after cultures are obtained.   I have personally spent 52 minutes involved in face-to-face and non-face-to-face activities for this patient on the day of the visit. Professional time spent includes the following activities: Preparing to see the patient (review of tests), Obtaining and/or reviewing separately obtained history (admission/discharge record), Performing a medically appropriate examination and/or evaluation , Ordering medications/tests/procedures, referring and communicating with other health care professionals, Documenting clinical information in the EMR, Independently interpreting results (not separately reported), Communicating results to the patient/family/caregiver, Counseling and educating the patient/family/caregiver and Care coordination (not separately reported).     LOS: 5 days   John Ware 02/27/2023, 10:22 AM

## 2023-02-28 ENCOUNTER — Encounter (HOSPITAL_COMMUNITY): Payer: Self-pay

## 2023-02-28 ENCOUNTER — Other Ambulatory Visit: Payer: Self-pay

## 2023-02-28 DIAGNOSIS — M4620 Osteomyelitis of vertebra, site unspecified: Secondary | ICD-10-CM | POA: Diagnosis not present

## 2023-02-28 MED ORDER — OXYCODONE HCL 5 MG PO TABS
10.0000 mg | ORAL_TABLET | ORAL | Status: DC | PRN
  Administered 2023-02-28 – 2023-03-06 (×28): 15 mg via ORAL
  Filled 2023-02-28 (×28): qty 3

## 2023-02-28 MED ORDER — OXYCODONE HCL 5 MG PO TABS: 10.0000 mg | ORAL_TABLET | ORAL | Status: DC | PRN

## 2023-02-28 MED ORDER — BACLOFEN 10 MG PO TABS
10.0000 mg | ORAL_TABLET | Freq: Three times a day (TID) | ORAL | Status: DC
  Administered 2023-02-28 – 2023-03-06 (×17): 10 mg via ORAL
  Filled 2023-02-28 (×17): qty 1

## 2023-02-28 MED ORDER — HYDROMORPHONE HCL 1 MG/ML IJ SOLN
1.0000 mg | INTRAMUSCULAR | Status: DC | PRN
  Administered 2023-02-28 – 2023-03-06 (×32): 2 mg via INTRAVENOUS
  Filled 2023-02-28 (×34): qty 2

## 2023-02-28 MED ORDER — HYDROMORPHONE HCL 1 MG/ML IJ SOLN: 1.0000 mg | Freq: Once | INTRAMUSCULAR | Status: DC

## 2023-02-28 MED ORDER — OXYCODONE-ACETAMINOPHEN 5-325 MG PO TABS: 1.0000 | ORAL_TABLET | ORAL | Status: DC | PRN

## 2023-02-28 NOTE — Plan of Care (Signed)

## 2023-02-28 NOTE — Evaluation (Signed)
Physical Therapy Evaluation & Discharge Patient Details Name: John Ware MRN: [id number] DOB: Jun 11, 1966 Today's Date: 02/28/2023  History of Present Illness  Pt is a 57 y.o. male who presented 02/22/23 to get premedicated for outpatient MRI, but was unable to tolerate it due to his worsening back pain. MRI was completed under anesthesia and revealed new osteomyelitis discitis at T10-11 with perhaps a component of epidural phlegmon. Pt has had prior admission for back pain and osteomyelitis. PMH includes: tricuspid valve endocarditis, chronic discitis and osteomyelitiis, DM2, chronic diastolic heart failure, HTN   Clinical Impression  Pt presents with condition above. Pt has had a progressive decline in functional mobility since September 2023. Pt was independent without DME prior to September 2023 but has recently been utilizing a rollator vs w/c for household mobility. Pt demonstrates deficits in bil lower extremity strength, sensation, and coordination along with deficits in balance and activity tolerance. He is at high risk for falls. Pt was agreeable to PT Eval but declines desire for any further acute PT services during this admission until his back pain is addressed with an intervention. MD aware. Pt also refusing bed or chair alarms, insisting to get up on his own even though educated to allow nursing to assist him. As pt is declining any further acute PT services and all education completed and questions answered at this time, PT will sign off. He would benefit from OPPT if he is agreeable to it though.        Assistance Recommended at Discharge PRN  If plan is discharge home, recommend the following:  Can travel by private vehicle  A little help with walking and/or transfers;A little help with bathing/dressing/bathroom;Assistance with cooking/housework;Assist for transportation;Help with stairs or ramp for entrance        Equipment Recommendations Other (comment) (pt requesting  electric scooter)  Recommendations for Other Services       Functional Status Assessment Patient has had a recent decline in their functional status and demonstrates the ability to make significant improvements in function in a reasonable and predictable amount of time.     Precautions / Restrictions Precautions Precautions: Fall;Back (for comfort) Precaution Booklet Issued: No Precaution Comments: reviewed back precautions for comfort Restrictions Weight Bearing Restrictions: No      Mobility  Bed Mobility Overal bed mobility: Modified Independent             General bed mobility comments: Pt able to transition supine <> sit EOB and roll without assistance, HOB elevated    Transfers Overall transfer level: Needs assistance Equipment used: Rolling walker (2 wheels) Transfers: Sit to/from Stand Sit to Stand: Min guard           General transfer comment: Min guard for safety transferring to stand from EOB and from commode, sway noted but no LOB    Ambulation/Gait Ambulation/Gait assistance: Min guard Gait Distance (Feet): 10 Feet (x2 bouts of ~10 ft each bout) Assistive device: Rolling walker (2 wheels) Gait Pattern/deviations: Step-to pattern, Decreased step length - right, Decreased step length - left, Decreased stride length, Decreased dorsiflexion - right, Decreased dorsiflexion - left, Narrow base of support, Shuffle, Trunk flexed Gait velocity: reduced Gait velocity interpretation: <1.8 ft/sec, indicate of risk for recurrent falls   General Gait Details: Pt takes slow, small, steps and displays a step-to pattern. Trunk sway and instability noted but no LOB, min guard for safety  Careers information officer  Tilt Bed    Modified Rankin (Stroke Patients Only)       Balance Overall balance assessment: Needs assistance Sitting-balance support: No upper extremity supported, Feet supported Sitting balance-[name]: Good      Standing balance support: Bilateral upper extremity supported, Single extremity supported, During functional activity, Reliant on assistive device for balance Standing balance-[name]: Poor Standing balance comment: Reliant on UE support                             Pertinent Vitals/Pain Pain Assessment Pain Assessment: Faces Faces Pain Scale: Hurts even more Pain Location: back Pain Descriptors / Indicators: Discomfort, Grimacing, Guarding, Sharp Pain Intervention(s): Limited activity within patient's tolerance, Monitored during session, Premedicated before session, Repositioned    Home Living Family/patient expects to be discharged to:: Private residence Living Arrangements: Spouse/significant other;Children (10 y.o.) Available Help at Discharge: Family;Available 24 hours/day Type of Home: House Home Access: Ramped entrance       Home Layout: One level Home Equipment: Pharmacist, hospital (2 wheels);Cane - single point;Wheelchair - Physiological scientist (4 wheels) Additional Comments: info carried over from entry 05/09/22    Prior Function Prior Level of Function : Independent/Modified Independent             Mobility Comments: Pt was independent without AD prior to his back surgery in September 2023, but has progressively declined and been reliant on his rollator or w/c for household mobility ADLs Comments: Pt was independent prior to his back surgery in September 2023, but has progressively declined     Hand Dominance        Extremity/Trunk Assessment   Upper Extremity Assessment Upper Extremity Assessment: Defer to OT evaluation    Lower Extremity Assessment Lower Extremity Assessment: RLE deficits/detail;LLE deficits/detail RLE Deficits / Details: bil lower extremity edema noted; MMT scores of 3 to 3+ grossly bil; reports numbness/tingling throughout legs bil, worse distally, worse on L than R RLE Sensation: decreased light touch LLE Deficits /  Details: bil lower extremity edema noted; MMT scores of 3 to 3+ grossly bil; reports numbness/tingling throughout legs bil, worse distally, worse on L than R LLE Sensation: decreased light touch    Cervical / Trunk Assessment Cervical / Trunk Assessment: Kyphotic;Other exceptions Cervical / Trunk Exceptions: back pain and discitis and osteomyelitis  Communication   Communication: No difficulties  Cognition Arousal/Alertness: Awake/alert Behavior During Therapy: WFL for tasks assessed/performed Overall Cognitive Status: Within Functional Limits for tasks assessed                                 General Comments: Pt upset about his back pain and requests no further therapy during admission after this eval until an intervention is performed to address his back issues.        General Comments General comments (skin integrity, edema, etc.): encouraged pt to continue to mobilize and perform his reportedly known HEP to prevent deconditioning, he verbalized understanding; educated pt on recs to have nursing supervision for OOB mobility due to his risk for falls, but pt refusing bed or chair alarms    Exercises     Assessment/Plan    PT Assessment Patient does not need any further PT services  PT Problem List         PT Treatment Interventions      PT Goals (Current goals can be found in the Care Plan  section)  Acute Rehab PT Goals Patient Stated Goal: to control his back pain PT Goal Formulation: All assessment and education complete, DC therapy Time For Goal Achievement: 03/01/23 Potential to Achieve Goals: Good    Frequency       Co-evaluation PT/OT/SLP Co-Evaluation/Treatment: Yes Reason for Co-Treatment: For patient/therapist safety;To address functional/ADL transfers PT goals addressed during session: Mobility/safety with mobility;Balance;Proper use of DME         AM-PAC PT "6 Clicks" Mobility  Outcome Measure Help needed turning from your back to your  side while in a flat bed without using bedrails?: None Help needed moving from lying on your back to sitting on the side of a flat bed without using bedrails?: None Help needed moving to and from a bed to a chair (including a wheelchair)?: A Little Help needed standing up from a chair using your arms (e.g., wheelchair or bedside chair)?: A Little Help needed to walk in hospital room?: A Little Help needed climbing 3-5 steps with a railing? : A Lot 6 Click Score: 19    End of Session   Activity Tolerance: Patient tolerated treatment well Patient left: in chair;with call bell/phone within reach   PT Visit Diagnosis: Unsteadiness on feet (R26.81);Other abnormalities of gait and mobility (R26.89);Muscle weakness (generalized) (M62.81);History of falling (Z91.81);Repeated falls (R29.6);Difficulty in walking, not elsewhere classified (R26.2);Other symptoms and signs involving the nervous system (R29.898)    Time: 1610-9604 PT Time Calculation (min) (ACUTE ONLY): 26 min   Charges:   PT Evaluation $PT Eval Moderate Complexity: 1 Mod   PT General Charges $$ ACUTE PT VISIT: 1 Visit         Raymond Gurney, PT, DPT Acute Rehabilitation Services  Office: [phone]   Jewel Baize 02/28/2023, 11:32 AM

## 2023-02-28 NOTE — Progress Notes (Signed)
John Ware  VHQ:[id number] DOB: 1966-05-11 DOA: 02/22/2023 PCP: Junie Spencer, FNP    Brief Narrative:  57 year old with a history of depression, DM2, HTN, spinal stenosis with neurogenic claudication, and chronic vertebral osteomyelitis followed by Neurosurgery and ID who was sent to the ER for premedication prior to a thoracic spine MRI.  He was unable to complete the MRI due to extreme pain.  He was therefore admitted to undergo MRI under anesthesia and for further investigation of worsening lower extremity weakness.  Consultants:  ID Neurosurgery  Goals of Care:  Code Status: Full Code   DVT prophylaxis: Lovenox  Interim Hx: Patient encountered some worsening pain last night.  Afebrile.  Vital signs stable.  No new complaints at the time my visit this afternoon.  Admits the current pain medications are not sufficient to allow him mobility or even regular positional changes without excruciating pain.  An attempt to proactively place a PICC line today was unsuccessful as the patient's pain was too severe to allow him to be positioned for the procedure.  Assessment & Plan:  Chronic vertebral MRSE, MSSA, and Pseudomonas osteomyelitis (since 2022) with acute intractable pain MRI of the lumbar and thoracic spine 02/25/2023 under anesthesia will noted findings consistent with discitis/osteomyelitis of the lower thoracic spine progressed since prior scan in September 2023 with phlegmon surrounding the thecal sac with moderate narrowing at T10-11 and severe narrowing at T11-12 - NS has evaluated and feel that surgical intervention would not be helpful at present - CT guided aspiration for culture to allow for directed antibiotics has been requested - ID following with [place] -anticipate patient will need prolonged course of IV antibiotic  DM2 with hyperglycemia Most recent A1c 7.7 -CBG remains reasonably controlled at present  Hyperlipidemia Not on chronic medical therapy  HTN BP  reasonably controlled when not in severe pain -monitor without change today  Thrombocytosis Stable  Hypokalemia Corrected with supplementation   Family Communication: no family present at time of exam Disposition: eventual D/C home v/s rehab    Objective: Blood pressure (!) 141/99, pulse 79, temperature 98.2 F (36.8 C), temperature source Oral, resp. rate 16, height 5\' 10"  (1.778 m), weight 81.6 kg, SpO2 96 %.  Intake/Output Summary (Last 24 hours) at 02/28/2023 0815 Last data filed at 02/28/2023 0555 Gross per 24 hour  Intake 720 ml  Output 2930 ml  Net -2210 ml    Filed Weights   02/22/23 0849 02/22/23 1449 02/25/23 1202  Weight: 81.6 kg 79.7 kg 81.6 kg    Examination: General: No acute respiratory distress Lungs: Clear to auscultation bilaterally -no wheezing Cardiovascular: Regular rate and rhythm  Abdomen: Nontender, nondistended, soft, bowel sounds positive Extremities: 1+ edema B LE w/o worrisome characteristics   CBC: Recent Labs  Lab 02/22/23 1056 02/23/23 0230 02/24/23 0413 02/25/23 0425 02/27/23 0411  WBC 8.3   < > 9.7 6.8 7.0  NEUTROABS 5.4  --   --   --   --   HGB 13.7   < > 11.3* 11.2* 11.1*  HCT 42.5   < > 34.9* 34.0* 34.3*  MCV 85.9   < > 84.1 85.0 82.5  PLT 478*   < > 365 405* 424*   < > = values in this interval not displayed.    Basic Metabolic Panel: Recent Labs  Lab 02/24/23 0413 02/25/23 0425 02/27/23 0411  NA 132* 134* 136  K 4.4 4.1 3.8  CL 95* 99 100  CO2 27 25 26  GLUCOSE 175* 138* 208*  BUN 11 7 7   CREATININE 0.73 0.60* 0.75  CALCIUM 8.7* 8.9 9.0    GFR: Estimated Creatinine Clearance: 107.7 mL/min (by C-G formula based on SCr of 0.75 mg/dL).   Scheduled Meds:  baclofen  5 mg Oral TID   enoxaparin (LOVENOX) injection  40 mg Subcutaneous Q24H   insulin aspart  0-15 Units Subcutaneous TID WC   lidocaine  1 patch Transdermal Q24H   morphine  30 mg Oral Q12H   oxyCODONE  5 mg Oral Once     LOS: 6 days   Lonia Blood, MD [facility] Office  [phone] Pager - Text Page per Loretha Stapler  If 7PM-7AM, please contact night-coverage per Amion 02/28/2023, 8:15 AM

## 2023-02-28 NOTE — Consult Note (Signed)
Chief Complaint: Patient was seen in consultation today for vertebral osteomyelitis   Referring Physician(s): Dr. Jetty Duhamel  Supervising Physician: Baldemar Lenis  Patient Status: [facility] - In-pt  History of Present Illness: John Ware is a 57 y.o. 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  He was evaluated by Neurosurgery who defer surgical management.  IR consulted for possible disc aspiration.   Past Medical History:  Diagnosis Date   Depression    Diabetes mellitus without complication (HCC)    Hypertension    Spinal stenosis    With Neurogenic Claudication    Past Surgical History:  Procedure Laterality Date   CARPAL TUNNEL RELEASE Bilateral    INCISION AND DRAINAGE ABSCESS Left 03/12/2021   Procedure: INCISION AND DRAINAGE ABSCESS;  Surgeon: [name], MD;  Location: [facility];  Service: Orthopedics;  Laterality: Left;   IR FLUORO GUIDED NEEDLE PLC ASPIRATION/INJECTION LOC  04/24/2022   IR FLUORO GUIDED NEEDLE PLC ASPIRATION/INJECTION LOC  12/05/2022   IR US GUIDE BX ASP/DRAIN  02/02/2021   IRRIGATION AND DEBRIDEMENT ABSCESS Left 02/08/2021   Procedure: MINOR INCISION AND DRAINAGE OF ABSCESS;  Surgeon: [name], MD;  Location: [facility];  Service: Orthopedics;  Laterality: Left;   IRRIGATION AND DEBRIDEMENT ABSCESS Left 02/10/2021   Procedure: IRRIGATION AND DEBRIDEMENT ABSCESS LEFT LATERAL CHEST WALL;  Surgeon: [name], MD;  Location: [facility];  Service: General;  Laterality: Left;   IRRIGATION AND DEBRIDEMENT ABSCESS Bilateral 02/20/2021   Procedure: MINOR INCISION AND DRAINAGE OF ABSCESS/WITH BILATERAL WOUND VAC PLACEMENT;  Surgeon: [name], MD;  Location: [facility];  Service: Orthopedics;  Laterality: Bilateral;   IRRIGATION AND  DEBRIDEMENT SHOULDER Bilateral 02/28/2021   Procedure: IRRIGATION AND DEBRIDEMENT SHOULDER;  Surgeon: [name], MD;  Location: [facility];  Service: Orthopedics;  Laterality: Bilateral;   IRRIGATION AND DEBRIDEMENT SHOULDER Left 03/06/2021   Procedure: IRRIGATION AND DEBRIDEMENT SHOULDER;  Surgeon: [name], MD;  Location: [facility];  Service: Orthopedics;  Laterality: Left;   IRRIGATION AND DEBRIDEMENT SHOULDER Left 03/08/2021   Procedure: IRRIGATION AND DEBRIDEMENT SHOULDER;  Surgeon: [name], MD;  Location: [facility];  Service: Orthopedics;  Laterality: Left;   LUMBAR LAMINECTOMY FOR EPIDURAL ABSCESS N/A 05/02/2022   Procedure: THORACIC  LAMINECTOMY AND DEBRIDEMENT  FOR EPIDURAL ABSCESS;  Surgeon: [name], MD;  Location: [facility];  Service: Neurosurgery;  Laterality: N/A;   LUMBAR LAMINECTOMY/DECOMPRESSION MICRODISCECTOMY N/A 12/13/2014   Procedure: LUMBAR THREE-FOUR, LUMBAR FOUR-FIVE, LUMBAR FIVE-SACRAL ONE LAMINECTOMY;  Surgeon: [name], MD;  Location: [facility];  Service: Neurosurgery;  Laterality: N/A;  L3-4 L4-5 L5-S1 Laminectomy   LUMBAR LAMINECTOMY/DECOMPRESSION MICRODISCECTOMY Left 06/05/2021   Procedure: Left lumbar one-two, Lumbar two-three Laminectomy;  Surgeon: [name], MD;  Location: [facility] OR;  Service: Neurosurgery;  Laterality: Left;   MENISCUS REPAIR     Right knee   MINOR APPLICATION OF WOUND VAC Left 02/20/2021   Procedure: MINOR APPLICATION OF KERLIX PACKING/REMOVAL OF WOUND VAC;  Surgeon: [name], MD;  Location: [facility];  Service: General;  Laterality: Left;   RADIOLOGY WITH ANESTHESIA N/A 02/25/2023   Procedure: MRI WITH ANESTHESIA OF THORACIC AND LUMBAR WITH AND WITHOUT CONTRAST;  Surgeon: Radiologist, Medication, MD;  Location: [facility];  Service: Radiology;  Laterality: N/A;   TEE WITHOUT CARDIOVERSION N/A 02/05/2021   Procedure: TRANSESOPHAGEAL  ECHOCARDIOGRAM (TEE);  Surgeon: Quintella Reichert, MD;  Location: [facility];  Service: Cardiovascular;   Laterality: N/A;   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 Authorizing Provider  acetaminophen (TYLENOL) 500 MG tablet Take 1 tablet (500 mg total) by mouth every 6 (six) hours as needed. 06/26/21  Yes Love, Evlyn Kanner, PA-C  Baclofen 5 MG TABS Take 1 tablet (5 mg total) by mouth 3 (three) times daily. 11/13/22  Yes   cefadroxil (DURICEF) 500 MG capsule Take 2 capsules (1,000 mg total) by mouth 2 (two) times daily. 01/30/23  Yes Odette Fraction, MD  doxycycline (VIBRA-TABS) 100 MG tablet Take 1 tablet (100 mg total) by mouth every 12 (twelve) hours. 12/09/22 12/04/23 Yes Dorcas Carrow, MD  gabapentin (NEURONTIN) 400 MG capsule Take 1 capsule (400 mg total) by mouth 2 (two) times daily. 08/16/22  Yes Hawks, Christy A, FNP  hydrOXYzine (ATARAX) 10 MG tablet Take 1 tablet (10 mg total) by mouth 3 (three) times daily. 02/05/23  Yes   morphine (KADIAN) 30 MG 24 hr capsule Take 1 capsule (30 mg total) by mouth every 12 (twelve) hours. 02/05/23  Yes   naloxone (NARCAN) 0.4 MG/ML injection Inject 1 mL (0.4 mg total) into the vein as needed. 06/26/21  Yes Love, Evlyn Kanner, PA-C  oxyCODONE-acetaminophen (PERCOCET) 10-325 MG tablet Take 1 tablet by mouth 5 times daily. 02/05/23  Yes   Baclofen 5 MG TABS Take 1 tablet (5 mg total) by mouth 3 (three) times daily. 02/05/23     Continuous Blood Gluc Sensor (FREESTYLE LIBRE 2 SENSOR) MISC CHECK BLOOD SUGAR 4 TIMES A DAY 08/22/22   Junie Spencer, FNP     Family History  Problem Relation Age of Onset   Cancer Mother    Colon cancer Neg Hx     Social History   Socioeconomic History   Marital status: Married     Spouse name: Not on file   Number of children: Not on file   Years of education: Not on file   Highest education level: Some college, no degree  Occupational History   Not on file  Tobacco Use   Smoking status: Former    Packs/day: 2.00    Years: 15.00    Additional pack years: 0.00    Total pack years: 30.00    Types: Cigarettes    Quit date: 2012    Years since quitting: 12.5    Passive exposure: Never   Smokeless tobacco: Never  Vaping Use   Vaping Use: Never used  Substance and Sexual Activity   Alcohol use: Yes    Comment: occasional   Drug use: No   Sexual activity: Not Currently  Other Topics Concern   Not on file  Social History Narrative   Not on file   Social Determinants of Health   Financial Resource Strain: Low Risk  (12/23/2022)   Overall Financial Resource Strain (CARDIA)    Difficulty of Paying Living Expenses: Not very hard  Food Insecurity: Food Insecurity Present (12/23/2022)   Hunger Vital Sign    Worried About Running Out of Food in the Last Year:  Sometimes true    Ran Out of Food in the Last Year: Sometimes true  Transportation Needs: No Transportation Needs (12/23/2022)   PRAPARE - Administrator, Civil Service (Medical): No    Lack of Transportation (Non-Medical): No  Physical Activity: Unknown (12/23/2022)   Exercise Vital Sign    Days of Exercise per Week: 0 days    Minutes of Exercise per Session: Not on file  Stress: No Stress Concern Present (12/23/2022)   [employer] of Occupational Health - Occupational Stress Questionnaire    Feeling of Stress : Not at all  Social Connections: Moderately Isolated (12/23/2022)   Social Connection and Isolation Panel [NHANES]    Frequency of Communication with Friends and Family: More than three times a week    Frequency of Social Gatherings with Friends and Family: Once a week    Attends Religious Services: Never    Database administrator or Organizations: No    Attends Museum/gallery exhibitions officer: Not on file    Marital Status: Married     Review of Systems: A 12 point ROS discussed and pertinent positives are indicated in the HPI above.  All other systems are negative.  Review of Systems  Constitutional:  Negative for fatigue and fever.  Respiratory:  Negative for cough and shortness of breath.   Cardiovascular:  Negative for chest pain.  Gastrointestinal:  Negative for abdominal pain.  Musculoskeletal:  Positive for back pain.  Psychiatric/Behavioral:  Negative for behavioral problems and confusion.     Vital Signs: BP (!) 144/87 (BP Location: Right Arm)   Pulse 83   Temp 98 F (36.7 C) (Oral)   Resp 15   Ht 5\' 10"  (1.778 m)   Wt 180 lb (81.6 kg)   SpO2 100%   BMI 25.83 kg/m   Physical Exam Vitals and nursing note reviewed.  Constitutional:      General: He is not in acute distress.    Appearance: Normal appearance. He is not ill-appearing.  HENT:     Mouth/Throat:     Mouth: Mucous membranes are moist.     Pharynx: Oropharynx is clear.  Cardiovascular:     Rate and Rhythm: Normal rate and regular rhythm.  Pulmonary:     Effort: Pulmonary effort is normal.     Breath sounds: Normal breath sounds.  Abdominal:     General: Abdomen is flat.     Palpations: Abdomen is soft.  Skin:    General: Skin is warm and dry.  Neurological:     General: No focal deficit present.     Mental Status: He is alert and oriented to person, place, and time. Mental status is at baseline.  Psychiatric:        Mood and Affect: Mood normal.        Behavior: Behavior normal.        Thought Content: Thought content normal.        Judgment: Judgment normal.     Imaging: [place] EKG SITE RITE  Result Date: 02/28/2023 If Site Rite image not attached, placement could not be confirmed due to current cardiac rhythm.  MR Lumbar Spine W Wo Contrast  Result Date: 02/25/2023 CLINICAL DATA:  Vertebral osteomyelitis intractable back pain. EXAM: MRI THORACIC AND LUMBAR  SPINE WITHOUT AND WITH CONTRAST TECHNIQUE: Multiplanar and multiecho pulse sequences of the thoracic and lumbar spine were obtained without and with intravenous contrast. CONTRAST:  8mL GADAVIST GADOBUTROL 1 MMOL/ML IV SOLN COMPARISON:  MRI lumbar spine Jan 22, 2023. MRI thoracic spine May 01, 2022. FINDINGS: MRI THORACIC SPINE FINDINGS Alignment:  Mildly exaggerated thoracic kyphosis. Vertebrae: Marrow edema involving the T9 through T12 vertebral bodies with edema and contrast enhancement of the corresponding intervertebral discs with endplate erosion at T10-11 and most notably at T11-12 consistent with discitis/osteomyelitis. This has progressed since prior thoracic spine MRI, particularly at the T11-12 level. Associated epidural phlegmon extending from the T9 through the L1 level measuring up to 6 mm anteriorly at the level of the T10-11 and T11-12 disc spaces, compared to 4 mm on prior MRI of the thoracic spine. There is also a prominent prevertebral phlegmon at these levels. No well-defined drainable collection identified. Cord:  Normal signal characteristics. Paraspinal and other soft tissues: As above. Disc levels: T1-2: Facet degenerative changes resulting in moderate bilateral neural foraminal narrowing. No spinal canal stenosis. T2-3: Facet degenerative changes resulting in moderate bilateral neural foraminal narrowing. No spinal canal stenosis. T3-4: Diffuse the level facet degenerative changes with moderate bilateral neural foraminal narrowing. No spinal canal stenosis. T4-5: Posterior disc protrusion causing mass effect on the cord and resulting in mild spinal canal stenosis. No significant neural foraminal narrowing. T5-6: Right posterolateral disc protrusion causing mass effect on the cord and resulting in moderate spinal canal stenosis. No neural foraminal narrowing. T6-7: No significant spinal canal or neural foraminal stenosis. T7-8: No significant spinal canal or neural foraminal stenosis.  T8-9: Central disc protrusion causing mass effect on the cord and resulting in mild spinal canal stenosis. No neural foraminal narrowing. T9-10: Left posterolateral disc protrusion resulting in mild spinal canal stenosis and moderate left neural foraminal narrowing. T10-11: Postsurgical changes from laminectomy. Epidural phlegmon surrounding the thecal sac and resulting moderate narrowing of the thecal sac. Severe bilateral neural foraminal narrowing T11-12: Phlegmon surrounding the thecal sac results in severe narrowing of the thecal sac with mass effect on the cord (series 22, image 34). Severe bilateral neural foraminal narrowing. T12-L1: Right posterolateral disc protrusion and moderate facet degenerative changes as well as epidural phlegmon resulting in mild spinal canal stenosis and severe right neural foraminal narrowing. MRI LUMBAR SPINE FINDINGS Segmentation: Transitional lumbosacral anatomy related to partial lumbarization of L1. Alignment: Grade 1 anterolisthesis of L5 over S1 status post fusion. Vertebrae: No significant signal changes in the lumbar vertebrae with stable T2 hyperintensity of the L1-2 and L2-3 intervertebral discs. No new signal abnormality. Postsurgical changes from laminectomy and fusion from L3 through S1. Stable mild wedging of the L2 vertebral body. Conus medullaris: Extends to the T12-L1 level. Signal characteristics appear maintained. Paraspinal and other soft tissues: Ventral epidural fluid collection at the L1 level is no longer seen. Posterior soft tissues septated fluid collection at the level of the L4-5 laminectomies measuring approximately 5.6 x 3.4 x 1.8 cm is unchanged. Disc levels: L1-2: Partial disc space fusion, moderate facet degenerative changes, moderate bilateral neural foraminal narrowing. No significant spinal canal stenosis. L2-3: Prominent hypertrophic facet degenerative changes and moderate bilateral neural foraminal narrowing. No significant spinal canal  stenosis. L3-4: No significant spinal canal or neural foraminal stenosis. L4-5: No significant spinal canal or neural foraminal stenosis. L5-S1: Moderate left neural foraminal narrowing. No significant spinal canal stenosis. IMPRESSION: 1. Findings consistent with discitis/osteomyelitis of the lower thoracic spine, progressed since prior thoracic MRI (September 2023). The T11-12 level, which was included on prior MRI of the lumbar spine (May 2024) appear stable. 2. Phlegmon surrounding the thecal sac results in moderate narrowing at T10-11 and severe at  T11-12, resulting in mass effect on the cord without cord signal abnormality. 3. Interval resolution of ventral epidural fluid collection at the L1 level. 4. Degenerative changes of the thoracic spine with moderate spinal canal stenosis at T5-6. 5. Degenerative changes of the lumbar spine with moderate bilateral neural foraminal narrowing at L1-2 and L2-3 and moderate left neural foraminal narrowing at L5-S1. Electronically Signed   By: Baldemar Lenis M.D.   On: 02/25/2023 16:54   MR THORACIC SPINE W WO CONTRAST  Result Date: 02/25/2023 CLINICAL DATA:  Vertebral osteomyelitis intractable back pain. EXAM: MRI THORACIC AND LUMBAR SPINE WITHOUT AND WITH CONTRAST TECHNIQUE: Multiplanar and multiecho pulse sequences of the thoracic and lumbar spine were obtained without and with intravenous contrast. CONTRAST:  8mL GADAVIST GADOBUTROL 1 MMOL/ML IV SOLN COMPARISON:  MRI lumbar spine Jan 22, 2023. MRI thoracic spine May 01, 2022. FINDINGS: MRI THORACIC SPINE FINDINGS Alignment:  Mildly exaggerated thoracic kyphosis. Vertebrae: Marrow edema involving the T9 through T12 vertebral bodies with edema and contrast enhancement of the corresponding intervertebral discs with endplate erosion at T10-11 and most notably at T11-12 consistent with discitis/osteomyelitis. This has progressed since prior thoracic spine MRI, particularly at the T11-12 level.  Associated epidural phlegmon extending from the T9 through the L1 level measuring up to 6 mm anteriorly at the level of the T10-11 and T11-12 disc spaces, compared to 4 mm on prior MRI of the thoracic spine. There is also a prominent prevertebral phlegmon at these levels. No well-defined drainable collection identified. Cord:  Normal signal characteristics. Paraspinal and other soft tissues: As above. Disc levels: T1-2: Facet degenerative changes resulting in moderate bilateral neural foraminal narrowing. No spinal canal stenosis. T2-3: Facet degenerative changes resulting in moderate bilateral neural foraminal narrowing. No spinal canal stenosis. T3-4: Diffuse the level facet degenerative changes with moderate bilateral neural foraminal narrowing. No spinal canal stenosis. T4-5: Posterior disc protrusion causing mass effect on the cord and resulting in mild spinal canal stenosis. No significant neural foraminal narrowing. T5-6: Right posterolateral disc protrusion causing mass effect on the cord and resulting in moderate spinal canal stenosis. No neural foraminal narrowing. T6-7: No significant spinal canal or neural foraminal stenosis. T7-8: No significant spinal canal or neural foraminal stenosis. T8-9: Central disc protrusion causing mass effect on the cord and resulting in mild spinal canal stenosis. No neural foraminal narrowing. T9-10: Left posterolateral disc protrusion resulting in mild spinal canal stenosis and moderate left neural foraminal narrowing. T10-11: Postsurgical changes from laminectomy. Epidural phlegmon surrounding the thecal sac and resulting moderate narrowing of the thecal sac. Severe bilateral neural foraminal narrowing T11-12: Phlegmon surrounding the thecal sac results in severe narrowing of the thecal sac with mass effect on the cord (series 22, image 34). Severe bilateral neural foraminal narrowing. T12-L1: Right posterolateral disc protrusion and moderate facet degenerative changes as  well as epidural phlegmon resulting in mild spinal canal stenosis and severe right neural foraminal narrowing. MRI LUMBAR SPINE FINDINGS Segmentation: Transitional lumbosacral anatomy related to partial lumbarization of L1. Alignment: Grade 1 anterolisthesis of L5 over S1 status post fusion. Vertebrae: No significant signal changes in the lumbar vertebrae with stable T2 hyperintensity of the L1-2 and L2-3 intervertebral discs. No new signal abnormality. Postsurgical changes from laminectomy and fusion from L3 through S1. Stable mild wedging of the L2 vertebral body. Conus medullaris: Extends to the T12-L1 level. Signal characteristics appear maintained. Paraspinal and other soft tissues: Ventral epidural fluid collection at the L1 level is no longer seen. Posterior  soft tissues septated fluid collection at the level of the L4-5 laminectomies measuring approximately 5.6 x 3.4 x 1.8 cm is unchanged. Disc levels: L1-2: Partial disc space fusion, moderate facet degenerative changes, moderate bilateral neural foraminal narrowing. No significant spinal canal stenosis. L2-3: Prominent hypertrophic facet degenerative changes and moderate bilateral neural foraminal narrowing. No significant spinal canal stenosis. L3-4: No significant spinal canal or neural foraminal stenosis. L4-5: No significant spinal canal or neural foraminal stenosis. L5-S1: Moderate left neural foraminal narrowing. No significant spinal canal stenosis. IMPRESSION: 1. Findings consistent with discitis/osteomyelitis of the lower thoracic spine, progressed since prior thoracic MRI (September 2023). The T11-12 level, which was included on prior MRI of the lumbar spine (May 2024) appear stable. 2. Phlegmon surrounding the thecal sac results in moderate narrowing at T10-11 and severe at T11-12, resulting in mass effect on the cord without cord signal abnormality. 3. Interval resolution of ventral epidural fluid collection at the L1 level. 4. Degenerative  changes of the thoracic spine with moderate spinal canal stenosis at T5-6. 5. Degenerative changes of the lumbar spine with moderate bilateral neural foraminal narrowing at L1-2 and L2-3 and moderate left neural foraminal narrowing at L5-S1. Electronically Signed   By: Baldemar Lenis M.D.   On: 02/25/2023 16:54    Labs:  CBC: Recent Labs    02/23/23 0230 02/24/23 0413 02/25/23 0425 02/27/23 0411  WBC 7.6 9.7 6.8 7.0  HGB 11.3* 11.3* 11.2* 11.1*  HCT 33.9* 34.9* 34.0* 34.3*  PLT 365 365 405* 424*    COAGS: Recent Labs    04/24/22 0956 12/05/22 0159  INR 1.1 1.1    BMP: Recent Labs    02/23/23 0230 02/24/23 0413 02/25/23 0425 02/27/23 0411  NA 136 132* 134* 136  K 4.1 4.4 4.1 3.8  CL 98 95* 99 100  CO2 27 27 25 26   GLUCOSE 127* 175* 138* 208*  BUN 14 11 7 7   CALCIUM 8.8* 8.7* 8.9 9.0  CREATININE 0.81 0.73 0.60* 0.75  GFRNONAA >60 >60 >60 >60    LIVER FUNCTION TESTS: Recent Labs    04/23/22 0257 05/09/22 0358 12/03/22 1047 02/23/23 0230  BILITOT 0.4 0.5 0.5 0.9  AST 13* 14* 24 17  ALT 11 18 18 15   ALKPHOS 75 92 123 85  PROT 7.1 6.7 7.6 6.0*  ALBUMIN 3.1* 2.8* 3.7 3.0*    TUMOR MARKERS: No results for input(s): "AFPTM", "CEA", "CA199", "CHROMGRNA" in the last 8760 hours.  Assessment and Plan: Vertebral osteomyelitls Patient with recurrent infections over the past 2 years, now readmitting with worsening pain.  MR Lumbar Spine shows: 1. Findings consistent with discitis/osteomyelitis of the lower thoracic spine, progressed since prior thoracic MRI (September 2023). The T11-12 level, which was included on prior MRI of the lumbar spine (May 2024) appear stable. 2. Phlegmon surrounding the thecal sac results in moderate narrowing at T10-11 and severe at T11-12, resulting in mass effect on the cord without cord signal abnormality.  Discussion held with patient this afternoon.  He understandably has reservations about disc aspiration.  He has  been through this process several times in the past. Currently he has a great deal of trouble lying flat on his back and says it feels like a "hot sword" to lie down.  He has to sleep, etc.. sitting up.  Without therapeutic benefit, he is hesitant to proceed with aspiration.  We discussed the diagnostic value of this exam, however during our candid conversation potential low yield was discussed.  Patient states he would like "something more convincing" before proceeding.   No current procedure planned in IR.   Thank you for this interesting consult.  I greatly enjoyed meeting Stanislaus L Arel and look forward to participating in their care.  A copy of this report was sent to the requesting provider on this date.  Electronically Signed: Hoyt Koch, PA 02/28/2023, 2:41 PM   I spent a total of 20 Minutes    in face to face in clinical consultation, greater than 50% of which was counseling/coordinating care for vertebral osteomyelitis.

## 2023-02-28 NOTE — Evaluation (Signed)
Occupational Therapy Evaluation Patient Details Name: John Ware MRN: [id number] DOB: 06-Nov-1965 Today's Date: 02/28/2023   History of Present Illness Pt is a 57 y.o. male who presented 02/22/23 to get premedicated for outpatient MRI, but was unable to tolerate it due to his worsening back pain. MRI was completed under anesthesia and revealed new osteomyelitis discitis at T10-11 with perhaps a component of epidural phlegmon. Pt has had prior admission for back pain and osteomyelitis. PMH includes: tricuspid valve endocarditis, chronic discitis and osteomyelitiis, DM2, chronic diastolic heart failure, HTN   Clinical Impression   PT admitted with osteomyelitis. Pt currently with functional limitiations due to the deficits listed below (see OT problem list). Pt reports having prior therapy and no needs to have therapy follow him acutely. Pt agreeable to movement during session for grooming. Hospital bed removed and replaced at the end of session. Communicate to the MD that therapy to sign off as pt upset that therapy ordered without any improvements yet. Pt educated that therapy could be consulted again if medical team completes any procedures during this admission and pt agreeable.  Pt will benefit from skilled OT to increase their independence and safety with adls and balance to allow discharge outpatient if patient agreeable. Pt appears to be at the same level as prior admission in 4/24 and 10/ 23.   OT to sign off        Recommendations for follow up therapy are one component of a multi-disciplinary discharge planning process, led by the attending physician.  Recommendations may be updated based on patient status, additional functional criteria and insurance authorization.   Assistance Recommended at Discharge PRN  Patient can return home with the following Assist for transportation    Functional Status Assessment     Equipment Recommendations  Other (comment) (requesting electric  scooter so he can leave his home)    Recommendations for Other Services       Precautions / Restrictions Precautions Precautions: Fall;Back Precaution Booklet Issued: No Precaution Comments: pt verbalized its more comfortable to sit in chair in flexed position on bil UE on knees. Restrictions Weight Bearing Restrictions: No      Mobility Bed Mobility Overal bed mobility: Modified Independent             General bed mobility comments: Pt able to transition supine <> sit EOB and roll without assistance, HOB elevated    Transfers Overall transfer level: Needs assistance Equipment used: Rolling walker (2 wheels) Transfers: Sit to/from Stand Sit to Stand: Min guard                  Balance                                           ADL either performed or assessed with clinical judgement   ADL Overall ADL's : Needs assistance/impaired Eating/Feeding: Independent   Grooming: Oral care;Supervision/safety;Sitting;Wash/dry face Grooming Details (indicate cue type and reason): sitting at sink able to complete task Upper Body Bathing: Supervision/ safety;Sitting   Lower Body Bathing: Supervison/ safety;Sit to/from stand         Lower Body Dressing Details (indicate cue type and reason): able to reach feet but not formally don socks Toilet Transfer: Min guard           Functional mobility during ADLs: Min guard;Rolling walker (2 wheels) General ADL Comments: pt progressed to  sink level grooming task and required sitting     Vision Baseline Vision/History: 1 Wears glasses Vision Assessment?: No apparent visual deficits     Perception     Praxis      Pertinent Vitals/Pain Pain Assessment Pain Assessment: Faces Faces Pain Scale: Hurts even more Pain Location: back Pain Descriptors / Indicators: Discomfort, Grimacing, Guarding, Sharp Pain Intervention(s): Monitored during session, Premedicated before session, Repositioned (pt states  that when he gets up he has to have medication because it goes as high as pain can go)     Hand Dominance Left   Extremity/Trunk Assessment Upper Extremity Assessment Upper Extremity Assessment: Overall WFL for tasks assessed   Lower Extremity Assessment Lower Extremity Assessment: Defer to PT evaluation RLE Deficits / Details: bil lower extremity edema noted; MMT scores of 3 to 3+ grossly bil; reports numbness/tingling throughout legs bil, worse distally, worse on L than R RLE Sensation: decreased light touch LLE Deficits / Details: bil lower extremity edema noted; MMT scores of 3 to 3+ grossly bil; reports numbness/tingling throughout legs bil, worse distally, worse on L than R LLE Sensation: decreased light touch   Cervical / Trunk Assessment Cervical / Trunk Assessment: Kyphotic;Other exceptions Cervical / Trunk Exceptions: back pain and discitis and osteomyelitis   Communication Communication Communication: No difficulties   Cognition Arousal/Alertness: Awake/alert Behavior During Therapy: WFL for tasks assessed/performed Overall Cognitive Status: Within Functional Limits for tasks assessed                                 General Comments: pt request no further therapy until the medication team addresses the numbness in his legs and does something different.     General Comments  pt prefers to side lying position with pile of blankets in a fetal position to relieve pain. pt declines to have bed [place] position slightly to help with bil LE edema. Pt encouraged to have staff present for all mobility. Pt removed chair alarm from chair and upset by the placement.    Exercises     Shoulder Instructions      Home Living Family/patient expects to be discharged to:: Private residence Living Arrangements: Spouse/significant other;Children Available Help at Discharge: Family;Available 24 hours/day Type of Home: House Home Access: Ramped entrance     Home  Layout: One level     Bathroom Shower/Tub: Producer, television/film/video: Handicapped height Bathroom Accessibility: Yes How Accessible: Accessible via walker;Accessible via wheelchair Home Equipment: Shower Counsellor (2 wheels);Cane - single point;Wheelchair - Physiological scientist (4 wheels)   Additional Comments: info carried over from entry 05/09/22- confirmed portions of informatoin with patient during session.      Prior Functioning/Environment Prior Level of Function : Independent/Modified Independent             Mobility Comments: Pt was independent without AD prior to his back surgery in September 2023, but has progressively declined and been reliant on his rollator or w/c for household mobility ADLs Comments: Pt was independent prior to his back surgery in September 2023, but has progressively declined        OT Problem List:        OT Treatment/Interventions:      OT Goals(Current goals can be found in the care plan section)    OT Frequency:      Co-evaluation PT/OT/SLP Co-Evaluation/Treatment: Yes Reason for Co-Treatment: For patient/therapist safety;To address functional/ADL transfers PT goals addressed during  session: Mobility/safety with mobility;Balance;Proper use of DME OT goals addressed during session: ADL's and self-care      AM-PAC OT "6 Clicks" Daily Activity     Outcome Measure Help from another person eating meals?: None Help from another person taking care of personal grooming?: None Help from another person toileting, which includes using toliet, bedpan, or urinal?: None Help from another person bathing (including washing, rinsing, drying)?: A Little Help from another person to put on and taking off regular upper body clothing?: None Help from another person to put on and taking off regular lower body clothing?: A Little 6 Click Score: 22   End of Session Equipment Utilized During Treatment: Rolling walker (2 wheels) Nurse  Communication: Mobility status;Precautions  Activity Tolerance: Patient tolerated treatment well Patient left: in chair;with call bell/phone within reach;with bed alarm set (removed the chair alarm prior to sitting)  OT Visit Diagnosis: Unsteadiness on feet (R26.81)                Time: 1610-9604 OT Time Calculation (min): 24 min Charges:  OT General Charges $OT Visit: 1 Visit OT Evaluation $OT Eval Moderate Complexity: 1 Mod   Brynn, OTR/L  Acute Rehabilitation Services Office: [phone] .   Mateo Flow 02/28/2023, 11:38 AM

## 2023-02-28 NOTE — Progress Notes (Addendum)
Arrived to place PICC for patient.  Patient has had previous PICCs and stated that he is unable to lie down in the bed due to extreme pain.  Even though he does not have to be completely flat during procedure, he states that he cannot lie on his back but must be on his side or sitting in the chair.  Lazarus Gowda, RN spoke with Shanda Bumps, RN bedside nurse regarding above.  Shanda Bumps reached out to the MD for pain medication for procedure and states that patient had to be completely sedated just for his MRI.  Patient feels that the ordered amount will not be adequate for pain management for the duration of the PICC insertion.  Plan made to follow up tomorrow when a more sufficient therapy can be obtained for pain control.  Plan for discharge is still pending at this time.  Dr. Sharon Seller made aware of above.

## 2023-03-01 DIAGNOSIS — M4620 Osteomyelitis of vertebra, site unspecified: Secondary | ICD-10-CM | POA: Diagnosis not present

## 2023-03-01 MED ORDER — MORPHINE SULFATE ER 15 MG PO TBCR
45.0000 mg | EXTENDED_RELEASE_TABLET | Freq: Two times a day (BID) | ORAL | Status: DC
  Administered 2023-03-01 – 2023-03-06 (×10): 45 mg via ORAL
  Filled 2023-03-01 (×11): qty 3

## 2023-03-01 NOTE — Plan of Care (Signed)
  Problem: Activity: Goal: Risk for activity intolerance will decrease Outcome: Progressing   Problem: Nutrition: Goal: Adequate nutrition will be maintained Outcome: Progressing   Problem: Pain Managment: Goal: General experience of comfort will improve Outcome: Progressing   Problem: Safety: Goal: Ability to remain free from injury will improve Outcome: Progressing   

## 2023-03-01 NOTE — Progress Notes (Signed)
   Providing Compassionate, Quality Care - Together  NEUROSURGERY PROGRESS NOTE   S: No issues overnight.   O: EXAM:  BP (!) 145/98 (BP Location: Right Arm)   Pulse 82   Temp 98 F (36.7 C) (Oral)   Resp 15   Ht 5\' 10"  (1.778 m)   Wt 81.6 kg   SpO2 95%   BMI 25.83 kg/m   Awake, alert, oriented  PERRL Speech fluent, appropriate  CNs grossly intact  5/5 BUE 4/5 BLE   ASSESSMENT:  57 y.o. male with   Chronic longstanding osteo discitis at T11-12  PLAN: -Agree with maximal medical management -No acute neurosurgical intervention -Okay for PT/OT    Thank you for allowing me to participate in this patient's care.  Please do not hesitate to call with questions or concerns.   Monia Pouch, DO Neurosurgeon [facility] Cell: [phone]

## 2023-03-01 NOTE — Plan of Care (Signed)

## 2023-03-01 NOTE — Progress Notes (Signed)
John Ware  ZOX:[id number] DOB: 1966-02-27 DOA: 02/22/2023 PCP: Junie Spencer, FNP    Brief Narrative:  57 year old with a history of depression, DM2, HTN, spinal stenosis with neurogenic claudication, and chronic vertebral osteomyelitis followed by Neurosurgery and ID who was sent to the ER for premedication prior to a thoracic spine MRI.  He was unable to complete the MRI due to extreme pain.  He was therefore admitted to undergo MRI under anesthesia and for further investigation of worsening lower extremity weakness.  Consultants:  ID Neurosurgery  Goals of Care:  Code Status: Full Code   DVT prophylaxis: Lovenox  Interim Hx: Patient states he feels better with his current pain regimen, but continues to have trouble with his pain medication wearing off prior to his next available dose.  We spoke at length about the CT-guided aspiration for culture and he now understands that this is not a therapeutic procedure but instead will be used to guide antibiotic therapy and he now agrees to this procedure.  He reports that his appetite is stable but he is wisely pushing his own intake.  No new complaints today.  Assessment & Plan:  Chronic vertebral MRSE, MSSA, and Pseudomonas osteomyelitis (since 2022) with acute intractable pain MRI of the lumbar and thoracic spine 02/25/2023 under anesthesia noted findings consistent with discitis/osteomyelitis of the lower thoracic spine progressed since prior scan in September 2023 with phlegmon surrounding the thecal sac with moderate narrowing at T10-11 and severe narrowing at T11-12 - NS has evaluated and feel that surgical intervention would not be helpful at present - CT guided aspiration for culture to allow for directed antibiotics has been requested - ID following with [place] -anticipate patient will need prolonged course of IV antibiotic  DM2 with hyperglycemia Most recent A1c 7.7 - CBG remains reasonably controlled at  present  Hyperlipidemia Not on chronic medical therapy  HTN BP reasonably controlled when not in severe pain - monitor without change today as pain meds are being adjusted   Thrombocytosis Stable  Hypokalemia Corrected with supplementation   Family Communication:  Disposition: eventual D/C home v/s rehab    Objective: Blood pressure (!) 157/110, pulse 72, temperature 98.1 F (36.7 C), temperature source Oral, resp. rate 15, height 5\' 10"  (1.778 m), weight 81.6 kg, SpO2 96 %.  Intake/Output Summary (Last 24 hours) at 03/01/2023 1529 Last data filed at 03/01/2023 1130 Gross per 24 hour  Intake 480 ml  Output 2400 ml  Net -1920 ml    Filed Weights   02/22/23 0849 02/22/23 1449 02/25/23 1202  Weight: 81.6 kg 79.7 kg 81.6 kg    Examination: General: No acute respiratory distress Lungs: Clear to auscultation bilaterally  Cardiovascular: Regular rate and rhythm  Abdomen: Nontender, nondistended, soft, bowel sounds positive Extremities: 1+ edema B LE w/o worrisome characteristics   CBC: Recent Labs  Lab 02/24/23 0413 02/25/23 0425 02/27/23 0411  WBC 9.7 6.8 7.0  HGB 11.3* 11.2* 11.1*  HCT 34.9* 34.0* 34.3*  MCV 84.1 85.0 82.5  PLT 365 405* 424*    Basic Metabolic Panel: Recent Labs  Lab 02/24/23 0413 02/25/23 0425 02/27/23 0411  NA 132* 134* 136  K 4.4 4.1 3.8  CL 95* 99 100  CO2 27 25 26   GLUCOSE 175* 138* 208*  BUN 11 7 7   CREATININE 0.73 0.60* 0.75  CALCIUM 8.7* 8.9 9.0    GFR: Estimated Creatinine Clearance: 107.7 mL/min (by C-G formula based on SCr of 0.75 mg/dL).   Scheduled Meds:  baclofen  10 mg Oral TID   enoxaparin (LOVENOX) injection  40 mg Subcutaneous Q24H   insulin aspart  0-15 Units Subcutaneous TID WC   lidocaine  1 patch Transdermal Q24H   morphine  45 mg Oral Q12H     LOS: 7 days   Lonia Blood, MD [facility] Office  [phone] Pager - Text Page per Loretha Stapler  If 7PM-7AM, please contact night-coverage per  Amion 03/01/2023, 3:29 PM

## 2023-03-01 NOTE — Progress Notes (Signed)
RN called into patients room for pain medication.  Patient says, "You messed up my pain control regimen by not giving me both pain medications at one time."  I explained to him protocol of administering only one narcotic pain medication at time, and then reevaluating its effectiveness before re administering.  Patient states, "all the other nurses do it that way, why can't you do it right?"  Charge nurse was called in to verify narcotic pain medication policy.  See MAR for copy of medication administration.

## 2023-03-02 DIAGNOSIS — M4620 Osteomyelitis of vertebra, site unspecified: Secondary | ICD-10-CM | POA: Diagnosis not present
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
## 2023-03-02 NOTE — Progress Notes (Signed)
John Ware  YNW:[id number] DOB: 08-14-1966 DOA: 02/22/2023 PCP: Junie Spencer, FNP    Brief Narrative:  57 year old with a history of depression, DM2, HTN, spinal stenosis with neurogenic claudication, and chronic vertebral osteomyelitis followed by Neurosurgery and ID who was sent to the ER for premedication prior to a thoracic spine MRI.  He was unable to complete the MRI due to extreme pain.  He was therefore admitted to undergo MRI under anesthesia and for further investigation of worsening lower extremity weakness.  Consultants:  ID Neurosurgery  Goals of Care:  Code Status: Full Code   DVT prophylaxis: Lovenox  Interim Hx: The patient became very angry last night as he was unhappy with the way his narcotic pain medications were being administered by the nursing staff.  He is afebrile.  Vital signs are stable.  No other new complaints today.  The patient reconfirms to me that he is willing to undergo CT-guided aspiration in radiology tomorrow in hopes of obtaining helpful cultures, and is also willing to undergo PICC line placement while under sedation  Assessment & Plan:  Chronic vertebral MRSE, MSSA, and Pseudomonas osteomyelitis (since 2022) with acute intractable pain MRI of the lumbar and thoracic spine 02/25/2023 under anesthesia noted findings consistent with discitis/osteomyelitis of the lower thoracic spine progressed since prior scan in September 2023 with phlegmon surrounding the thecal sac with moderate narrowing at T10-11 and severe narrowing at T11-12 - NS has evaluated and feel that surgical intervention would not be helpful at present - CT guided aspiration for culture to allow for directed antibiotics has been requested, but performance has been delayed as patient initially refused I am working to get this procedure rescheduled at this time - ID following with [place] - patient will need prolonged course of IV antibiotic with current plan to use cefepime for 6-8 weeks  after aspiration is completed  DM2 with hyperglycemia Most recent A1c 7.7 - CBG well-controlled  Hyperlipidemia Not on chronic medical therapy  HTN BP reasonably controlled when not in severe pain - monitor without change today as pain meds are being adjusted   Thrombocytosis Stable  Hypokalemia Corrected with supplementation   Family Communication: Spoke with significant other at bedside Disposition: eventual D/C home v/s rehab    Objective: Blood pressure (!) 149/90, pulse 81, temperature 98 F (36.7 C), temperature source Oral, resp. rate 20, height 5\' 10"  (1.778 m), weight 81.6 kg, SpO2 98 %.  Intake/Output Summary (Last 24 hours) at 03/02/2023 0808 Last data filed at 03/01/2023 2359 Gross per 24 hour  Intake 1260 ml  Output 400 ml  Net 860 ml    Filed Weights   02/22/23 0849 02/22/23 1449 02/25/23 1202  Weight: 81.6 kg 79.7 kg 81.6 kg    Examination: General: No acute respiratory distress Lungs: Clear to auscultation bilaterally  Cardiovascular: Regular rate and rhythm  Abdomen: Nontender, nondistended, soft, bowel sounds positive Extremities: 1+ edema B LE w/o worrisome characteristics   CBC: Recent Labs  Lab 02/24/23 0413 02/25/23 0425 02/27/23 0411  WBC 9.7 6.8 7.0  HGB 11.3* 11.2* 11.1*  HCT 34.9* 34.0* 34.3*  MCV 84.1 85.0 82.5  PLT 365 405* 424*    Basic Metabolic Panel: Recent Labs  Lab 02/24/23 0413 02/25/23 0425 02/27/23 0411  NA 132* 134* 136  K 4.4 4.1 3.8  CL 95* 99 100  CO2 27 25 26   GLUCOSE 175* 138* 208*  BUN 11 7 7   CREATININE 0.73 0.60* 0.75  CALCIUM 8.7* 8.9  9.0    GFR: Estimated Creatinine Clearance: 107.7 mL/min (by C-G formula based on SCr of 0.75 mg/dL).   Scheduled Meds:  baclofen  10 mg Oral TID   enoxaparin (LOVENOX) injection  40 mg Subcutaneous Q24H   insulin aspart  0-15 Units Subcutaneous TID WC   lidocaine  1 patch Transdermal Q24H   morphine  45 mg Oral Q12H     LOS: 8 days   Lonia Blood,  MD [facility] Office  [phone] Pager - Text Page per Loretha Stapler  If 7PM-7AM, please contact night-coverage per Amion 03/02/2023, 8:08 AM

## 2023-03-02 NOTE — Consult Note (Cosign Needed Addendum)
Chief Complaint: Patient was seen in consultation today for vertebral osteomyelitis   Referring Physician(s): Dr. Jetty Duhamel  Supervising Physician: Baldemar Lenis  Patient Status: [facility] - In-pt  History of Present Illness: John Ware is a 57 y.o. 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  He was evaluated by Neurosurgery who defer surgical management.  IR consulted for possible disc aspiration. Originally, patient had declined procedure, but is now agreeable to proceeding. Concurrent PICC placement under sedation is also requested.  Past Medical History:  Diagnosis Date   Depression    Diabetes mellitus without complication (HCC)    Hypertension    Spinal stenosis    With Neurogenic Claudication    Past Surgical History:  Procedure Laterality Date   CARPAL TUNNEL RELEASE Bilateral    INCISION AND DRAINAGE ABSCESS Left 03/12/2021   Procedure: INCISION AND DRAINAGE ABSCESS;  Surgeon: [name], MD;  Location: [facility];  Service: Orthopedics;  Laterality: Left;   IR FLUORO GUIDED NEEDLE PLC ASPIRATION/INJECTION LOC  04/24/2022   IR FLUORO GUIDED NEEDLE PLC ASPIRATION/INJECTION LOC  12/05/2022   IR US GUIDE BX ASP/DRAIN  02/02/2021   IRRIGATION AND DEBRIDEMENT ABSCESS Left 02/08/2021   Procedure: MINOR INCISION AND DRAINAGE OF ABSCESS;  Surgeon: [name], MD;  Location: [facility];  Service: Orthopedics;  Laterality: Left;   IRRIGATION AND DEBRIDEMENT ABSCESS Left 02/10/2021   Procedure: IRRIGATION AND DEBRIDEMENT ABSCESS LEFT LATERAL CHEST WALL;  Surgeon: [name], MD;  Location: [facility];  Service: General;  Laterality: Left;   IRRIGATION AND DEBRIDEMENT ABSCESS Bilateral 02/20/2021   Procedure: MINOR INCISION AND DRAINAGE OF ABSCESS/WITH BILATERAL  WOUND VAC PLACEMENT;  Surgeon: [name], MD;  Location: [facility];  Service: Orthopedics;  Laterality: Bilateral;   IRRIGATION AND DEBRIDEMENT SHOULDER Bilateral 02/28/2021   Procedure: IRRIGATION AND DEBRIDEMENT SHOULDER;  Surgeon: [name], MD;  Location: [facility];  Service: Orthopedics;  Laterality: Bilateral;   IRRIGATION AND DEBRIDEMENT SHOULDER Left 03/06/2021   Procedure: IRRIGATION AND DEBRIDEMENT SHOULDER;  Surgeon: [name], MD;  Location: [facility];  Service: Orthopedics;  Laterality: Left;   IRRIGATION AND DEBRIDEMENT SHOULDER Left 03/08/2021   Procedure: IRRIGATION AND DEBRIDEMENT SHOULDER;  Surgeon: [name], MD;  Location: [facility];  Service: Orthopedics;  Laterality: Left;   LUMBAR LAMINECTOMY FOR EPIDURAL ABSCESS N/A 05/02/2022   Procedure: THORACIC  LAMINECTOMY AND DEBRIDEMENT  FOR EPIDURAL ABSCESS;  Surgeon: [name], MD;  Location: [facility];  Service: Neurosurgery;  Laterality: N/A;   LUMBAR LAMINECTOMY/DECOMPRESSION MICRODISCECTOMY N/A 12/13/2014   Procedure: LUMBAR THREE-FOUR, LUMBAR FOUR-FIVE, LUMBAR FIVE-SACRAL ONE LAMINECTOMY;  Surgeon: [name], MD;  Location: [facility];  Service: Neurosurgery;  Laterality: N/A;  L3-4 L4-5 L5-S1 Laminectomy   LUMBAR LAMINECTOMY/DECOMPRESSION MICRODISCECTOMY Left 06/05/2021   Procedure: Left lumbar one-two, Lumbar two-three Laminectomy;  Surgeon: [name], MD;  Location: [facility] OR;  Service: Neurosurgery;  Laterality: Left;   MENISCUS REPAIR     Right knee   MINOR APPLICATION OF WOUND VAC Left 02/20/2021   Procedure: MINOR APPLICATION OF KERLIX PACKING/REMOVAL OF WOUND VAC;  Surgeon: [name], MD;  Location: [facility];  Service: General;  Laterality: Left;   RADIOLOGY WITH ANESTHESIA N/A 02/25/2023   Procedure: MRI WITH ANESTHESIA OF THORACIC AND LUMBAR WITH AND WITHOUT CONTRAST;  Surgeon: Radiologist, Medication, MD;  Location: [facility]  OR;  Service: Radiology;  Laterality: N/A;   TEE WITHOUT CARDIOVERSION N/A 02/05/2021    Procedure: TRANSESOPHAGEAL ECHOCARDIOGRAM (TEE);  Surgeon: Quintella Reichert, MD;  Location: [facility] ENDOSCOPY;  Service: Cardiovascular;  Laterality: N/A;   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 Authorizing Provider  acetaminophen (TYLENOL) 500 MG tablet Take 1 tablet (500 mg total) by mouth every 6 (six) hours as needed. 06/26/21  Yes Love, Evlyn Kanner, PA-C  Baclofen 5 MG TABS Take 1 tablet (5 mg total) by mouth 3 (three) times daily. 11/13/22  Yes   cefadroxil (DURICEF) 500 MG capsule Take 2 capsules (1,000 mg total) by mouth 2 (two) times daily. 01/30/23  Yes Odette Fraction, MD  doxycycline (VIBRA-TABS) 100 MG tablet Take 1 tablet (100 mg total) by mouth every 12 (twelve) hours. 12/09/22 12/04/23 Yes Dorcas Carrow, MD  gabapentin (NEURONTIN) 400 MG capsule Take 1 capsule (400 mg total) by mouth 2 (two) times daily. 08/16/22  Yes Hawks, Christy A, FNP  hydrOXYzine (ATARAX) 10 MG tablet Take 1 tablet (10 mg total) by mouth 3 (three) times daily. 02/05/23  Yes   morphine (KADIAN) 30 MG 24 hr capsule Take 1 capsule (30 mg total) by mouth every 12 (twelve) hours. 02/05/23  Yes   naloxone (NARCAN) 0.4 MG/ML injection Inject 1 mL (0.4 mg total) into the vein as needed. 06/26/21  Yes Love, Evlyn Kanner, PA-C  oxyCODONE-acetaminophen (PERCOCET) 10-325 MG tablet Take 1 tablet by mouth 5 times daily. 02/05/23  Yes   Baclofen 5 MG TABS Take 1 tablet (5 mg total) by mouth 3 (three) times daily. 02/05/23     Continuous Blood Gluc Sensor (FREESTYLE LIBRE 2 SENSOR) MISC CHECK BLOOD SUGAR 4 TIMES A DAY 08/22/22   Junie Spencer, FNP     Family History  Problem Relation Age  of Onset   Cancer Mother    Colon cancer Neg Hx     Social History   Socioeconomic History   Marital status: Married    Spouse name: Not on file   Number of children: Not on file   Years of education: Not on file   Highest education level: Some college, no degree  Occupational History   Not on file  Tobacco Use   Smoking status: Former    Packs/day: 2.00    Years: 15.00    Additional pack years: 0.00    Total pack years: 30.00    Types: Cigarettes    Quit date: 2012    Years since quitting: 12.5    Passive exposure: Never   Smokeless tobacco: Never  Vaping Use   Vaping Use: Never used  Substance and Sexual Activity   Alcohol use: Yes    Comment: occasional   Drug use: No   Sexual activity: Not Currently  Other Topics Concern   Not on file  Social History Narrative   Not on file   Social Determinants of Health   Financial Resource Strain: Low Risk  (12/23/2022)   Overall Financial Resource Strain (CARDIA)    Difficulty of Paying Living Expenses: Not very hard  Food Insecurity: Food Insecurity Present (12/23/2022)  Hunger Vital Sign    Worried About Running Out of Food in the Last Year: Sometimes true    Ran Out of Food in the Last Year: Sometimes true  Transportation Needs: No Transportation Needs (12/23/2022)   PRAPARE - Administrator, Civil Service (Medical): No    Lack of Transportation (Non-Medical): No  Physical Activity: Unknown (12/23/2022)   Exercise Vital Sign    Days of Exercise per Week: 0 days    Minutes of Exercise per Session: Not on file  Stress: No Stress Concern Present (12/23/2022)   [employer] of Occupational Health - Occupational Stress Questionnaire    Feeling of Stress : Not at all  Social Connections: Moderately Isolated (12/23/2022)   Social Connection and Isolation Panel [NHANES]    Frequency of Communication with Friends and Family: More than three times a week    Frequency of Social Gatherings with Friends and  Family: Once a week    Attends Religious Services: Never    Database administrator or Organizations: No    Attends Engineer, structural: Not on file    Marital Status: Married     Review of Systems: A 12 point ROS discussed and pertinent positives are indicated in the HPI above.  All other systems are negative.  Review of Systems  Constitutional:  Negative for chills and fever.  Respiratory:  Negative for chest tightness and shortness of breath.   Cardiovascular:  Positive for leg swelling. Negative for chest pain.  Gastrointestinal:  Positive for diarrhea. Negative for abdominal pain, nausea and vomiting.  Musculoskeletal:  Positive for back pain.  Neurological:  Negative for dizziness and headaches.  Psychiatric/Behavioral:  Negative for behavioral problems and confusion.     Vital Signs: BP (!) 156/99 (BP Location: Left Arm)   Pulse 74   Temp 98 F (36.7 C) (Oral)   Resp 18   Ht 5\' 10"  (1.778 m)   Wt 180 lb (81.6 kg)   SpO2 100%   BMI 25.83 kg/m   Physical Exam Vitals and nursing note reviewed.  Constitutional:      General: He is not in acute distress.    Appearance: Normal appearance. He is not ill-appearing.  HENT:     Mouth/Throat:     Mouth: Mucous membranes are moist.     Pharynx: Oropharynx is clear.  Cardiovascular:     Rate and Rhythm: Normal rate and regular rhythm.     Pulses: Normal pulses.     Heart sounds: Normal heart sounds.  Pulmonary:     Effort: Pulmonary effort is normal.     Breath sounds: Normal breath sounds.  Abdominal:     General: Abdomen is flat.     Palpations: Abdomen is soft.  Musculoskeletal:     Right lower leg: Edema present.     Left lower leg: Edema present.  Skin:    General: Skin is warm and dry.  Neurological:     General: No focal deficit present.     Mental Status: He is alert and oriented to person, place, and time. Mental status is at baseline.  Psychiatric:        Mood and Affect: Mood normal.         Behavior: Behavior normal.        Thought Content: Thought content normal.        Judgment: Judgment normal.     Imaging: [place] EKG SITE RITE  Result Date: 02/28/2023 If Site [facility]  not attached, placement could not be confirmed due to current cardiac rhythm.  MR Lumbar Spine W Wo Contrast  Result Date: 02/25/2023 CLINICAL DATA:  Vertebral osteomyelitis intractable back pain. EXAM: MRI THORACIC AND LUMBAR SPINE WITHOUT AND WITH CONTRAST TECHNIQUE: Multiplanar and multiecho pulse sequences of the thoracic and lumbar spine were obtained without and with intravenous contrast. CONTRAST:  8mL GADAVIST GADOBUTROL 1 MMOL/ML IV SOLN COMPARISON:  MRI lumbar spine Jan 22, 2023. MRI thoracic spine May 01, 2022. FINDINGS: MRI THORACIC SPINE FINDINGS Alignment:  Mildly exaggerated thoracic kyphosis. Vertebrae: Marrow edema involving the T9 through T12 vertebral bodies with edema and contrast enhancement of the corresponding intervertebral discs with endplate erosion at T10-11 and most notably at T11-12 consistent with discitis/osteomyelitis. This has progressed since prior thoracic spine MRI, particularly at the T11-12 level. Associated epidural phlegmon extending from the T9 through the L1 level measuring up to 6 mm anteriorly at the level of the T10-11 and T11-12 disc spaces, compared to 4 mm on prior MRI of the thoracic spine. There is also a prominent prevertebral phlegmon at these levels. No well-defined drainable collection identified. Cord:  Normal signal characteristics. Paraspinal and other soft tissues: As above. Disc levels: T1-2: Facet degenerative changes resulting in moderate bilateral neural foraminal narrowing. No spinal canal stenosis. T2-3: Facet degenerative changes resulting in moderate bilateral neural foraminal narrowing. No spinal canal stenosis. T3-4: Diffuse the level facet degenerative changes with moderate bilateral neural foraminal narrowing. No spinal canal stenosis. T4-5: Posterior  disc protrusion causing mass effect on the cord and resulting in mild spinal canal stenosis. No significant neural foraminal narrowing. T5-6: Right posterolateral disc protrusion causing mass effect on the cord and resulting in moderate spinal canal stenosis. No neural foraminal narrowing. T6-7: No significant spinal canal or neural foraminal stenosis. T7-8: No significant spinal canal or neural foraminal stenosis. T8-9: Central disc protrusion causing mass effect on the cord and resulting in mild spinal canal stenosis. No neural foraminal narrowing. T9-10: Left posterolateral disc protrusion resulting in mild spinal canal stenosis and moderate left neural foraminal narrowing. T10-11: Postsurgical changes from laminectomy. Epidural phlegmon surrounding the thecal sac and resulting moderate narrowing of the thecal sac. Severe bilateral neural foraminal narrowing T11-12: Phlegmon surrounding the thecal sac results in severe narrowing of the thecal sac with mass effect on the cord (series 22, image 34). Severe bilateral neural foraminal narrowing. T12-L1: Right posterolateral disc protrusion and moderate facet degenerative changes as well as epidural phlegmon resulting in mild spinal canal stenosis and severe right neural foraminal narrowing. MRI LUMBAR SPINE FINDINGS Segmentation: Transitional lumbosacral anatomy related to partial lumbarization of L1. Alignment: Grade 1 anterolisthesis of L5 over S1 status post fusion. Vertebrae: No significant signal changes in the lumbar vertebrae with stable T2 hyperintensity of the L1-2 and L2-3 intervertebral discs. No new signal abnormality. Postsurgical changes from laminectomy and fusion from L3 through S1. Stable mild wedging of the L2 vertebral body. Conus medullaris: Extends to the T12-L1 level. Signal characteristics appear maintained. Paraspinal and other soft tissues: Ventral epidural fluid collection at the L1 level is no longer seen. Posterior soft tissues septated  fluid collection at the level of the L4-5 laminectomies measuring approximately 5.6 x 3.4 x 1.8 cm is unchanged. Disc levels: L1-2: Partial disc space fusion, moderate facet degenerative changes, moderate bilateral neural foraminal narrowing. No significant spinal canal stenosis. L2-3: Prominent hypertrophic facet degenerative changes and moderate bilateral neural foraminal narrowing. No significant spinal canal stenosis. L3-4: No significant spinal canal or neural foraminal  stenosis. L4-5: No significant spinal canal or neural foraminal stenosis. L5-S1: Moderate left neural foraminal narrowing. No significant spinal canal stenosis. IMPRESSION: 1. Findings consistent with discitis/osteomyelitis of the lower thoracic spine, progressed since prior thoracic MRI (September 2023). The T11-12 level, which was included on prior MRI of the lumbar spine (May 2024) appear stable. 2. Phlegmon surrounding the thecal sac results in moderate narrowing at T10-11 and severe at T11-12, resulting in mass effect on the cord without cord signal abnormality. 3. Interval resolution of ventral epidural fluid collection at the L1 level. 4. Degenerative changes of the thoracic spine with moderate spinal canal stenosis at T5-6. 5. Degenerative changes of the lumbar spine with moderate bilateral neural foraminal narrowing at L1-2 and L2-3 and moderate left neural foraminal narrowing at L5-S1. Electronically Signed   By: Baldemar Lenis M.D.   On: 02/25/2023 16:54   MR THORACIC SPINE W WO CONTRAST  Result Date: 02/25/2023 CLINICAL DATA:  Vertebral osteomyelitis intractable back pain. EXAM: MRI THORACIC AND LUMBAR SPINE WITHOUT AND WITH CONTRAST TECHNIQUE: Multiplanar and multiecho pulse sequences of the thoracic and lumbar spine were obtained without and with intravenous contrast. CONTRAST:  8mL GADAVIST GADOBUTROL 1 MMOL/ML IV SOLN COMPARISON:  MRI lumbar spine Jan 22, 2023. MRI thoracic spine May 01, 2022. FINDINGS: MRI  THORACIC SPINE FINDINGS Alignment:  Mildly exaggerated thoracic kyphosis. Vertebrae: Marrow edema involving the T9 through T12 vertebral bodies with edema and contrast enhancement of the corresponding intervertebral discs with endplate erosion at T10-11 and most notably at T11-12 consistent with discitis/osteomyelitis. This has progressed since prior thoracic spine MRI, particularly at the T11-12 level. Associated epidural phlegmon extending from the T9 through the L1 level measuring up to 6 mm anteriorly at the level of the T10-11 and T11-12 disc spaces, compared to 4 mm on prior MRI of the thoracic spine. There is also a prominent prevertebral phlegmon at these levels. No well-defined drainable collection identified. Cord:  Normal signal characteristics. Paraspinal and other soft tissues: As above. Disc levels: T1-2: Facet degenerative changes resulting in moderate bilateral neural foraminal narrowing. No spinal canal stenosis. T2-3: Facet degenerative changes resulting in moderate bilateral neural foraminal narrowing. No spinal canal stenosis. T3-4: Diffuse the level facet degenerative changes with moderate bilateral neural foraminal narrowing. No spinal canal stenosis. T4-5: Posterior disc protrusion causing mass effect on the cord and resulting in mild spinal canal stenosis. No significant neural foraminal narrowing. T5-6: Right posterolateral disc protrusion causing mass effect on the cord and resulting in moderate spinal canal stenosis. No neural foraminal narrowing. T6-7: No significant spinal canal or neural foraminal stenosis. T7-8: No significant spinal canal or neural foraminal stenosis. T8-9: Central disc protrusion causing mass effect on the cord and resulting in mild spinal canal stenosis. No neural foraminal narrowing. T9-10: Left posterolateral disc protrusion resulting in mild spinal canal stenosis and moderate left neural foraminal narrowing. T10-11: Postsurgical changes from laminectomy. Epidural  phlegmon surrounding the thecal sac and resulting moderate narrowing of the thecal sac. Severe bilateral neural foraminal narrowing T11-12: Phlegmon surrounding the thecal sac results in severe narrowing of the thecal sac with mass effect on the cord (series 22, image 34). Severe bilateral neural foraminal narrowing. T12-L1: Right posterolateral disc protrusion and moderate facet degenerative changes as well as epidural phlegmon resulting in mild spinal canal stenosis and severe right neural foraminal narrowing. MRI LUMBAR SPINE FINDINGS Segmentation: Transitional lumbosacral anatomy related to partial lumbarization of L1. Alignment: Grade 1 anterolisthesis of L5 over S1 status post fusion.  Vertebrae: No significant signal changes in the lumbar vertebrae with stable T2 hyperintensity of the L1-2 and L2-3 intervertebral discs. No new signal abnormality. Postsurgical changes from laminectomy and fusion from L3 through S1. Stable mild wedging of the L2 vertebral body. Conus medullaris: Extends to the T12-L1 level. Signal characteristics appear maintained. Paraspinal and other soft tissues: Ventral epidural fluid collection at the L1 level is no longer seen. Posterior soft tissues septated fluid collection at the level of the L4-5 laminectomies measuring approximately 5.6 x 3.4 x 1.8 cm is unchanged. Disc levels: L1-2: Partial disc space fusion, moderate facet degenerative changes, moderate bilateral neural foraminal narrowing. No significant spinal canal stenosis. L2-3: Prominent hypertrophic facet degenerative changes and moderate bilateral neural foraminal narrowing. No significant spinal canal stenosis. L3-4: No significant spinal canal or neural foraminal stenosis. L4-5: No significant spinal canal or neural foraminal stenosis. L5-S1: Moderate left neural foraminal narrowing. No significant spinal canal stenosis. IMPRESSION: 1. Findings consistent with discitis/osteomyelitis of the lower thoracic spine, progressed  since prior thoracic MRI (September 2023). The T11-12 level, which was included on prior MRI of the lumbar spine (May 2024) appear stable. 2. Phlegmon surrounding the thecal sac results in moderate narrowing at T10-11 and severe at T11-12, resulting in mass effect on the cord without cord signal abnormality. 3. Interval resolution of ventral epidural fluid collection at the L1 level. 4. Degenerative changes of the thoracic spine with moderate spinal canal stenosis at T5-6. 5. Degenerative changes of the lumbar spine with moderate bilateral neural foraminal narrowing at L1-2 and L2-3 and moderate left neural foraminal narrowing at L5-S1. Electronically Signed   By: Baldemar Lenis M.D.   On: 02/25/2023 16:54    Labs:  CBC: Recent Labs    02/23/23 0230 02/24/23 0413 02/25/23 0425 02/27/23 0411  WBC 7.6 9.7 6.8 7.0  HGB 11.3* 11.3* 11.2* 11.1*  HCT 33.9* 34.9* 34.0* 34.3*  PLT 365 365 405* 424*    COAGS: Recent Labs    04/24/22 0956 12/05/22 0159  INR 1.1 1.1    BMP: Recent Labs    02/23/23 0230 02/24/23 0413 02/25/23 0425 02/27/23 0411  NA 136 132* 134* 136  K 4.1 4.4 4.1 3.8  CL 98 95* 99 100  CO2 27 27 25 26   GLUCOSE 127* 175* 138* 208*  BUN 14 11 7 7   CALCIUM 8.8* 8.7* 8.9 9.0  CREATININE 0.81 0.73 0.60* 0.75  GFRNONAA >60 >60 >60 >60    LIVER FUNCTION TESTS: Recent Labs    04/23/22 0257 05/09/22 0358 12/03/22 1047 02/23/23 0230  BILITOT 0.4 0.5 0.5 0.9  AST 13* 14* 24 17  ALT 11 18 18 15   ALKPHOS 75 92 123 85  PROT 7.1 6.7 7.6 6.0*  ALBUMIN 3.1* 2.8* 3.7 3.0*    TUMOR MARKERS: No results for input(s): "AFPTM", "CEA", "CA199", "CHROMGRNA" in the last 8760 hours.  Assessment and Plan: Vertebral osteomyelitls Patient with recurrent infections over the past 2 years, now readmitting with worsening pain.  MR Lumbar Spine shows: 1. Findings consistent with discitis/osteomyelitis of the lower thoracic spine, progressed since prior thoracic MRI  (September 2023). The T11-12 level, which was included on prior MRI of the lumbar spine (May 2024) appear stable. 2. Phlegmon surrounding the thecal sac results in moderate narrowing at T10-11 and severe at T11-12, resulting in mass effect on the cord without cord signal abnormality.  Image-guided T10-11 and T11-12 disc aspiration requested, along with concurrent PICC placement under sedation due to patient unable  to tolerate lying flat without sedation.  While patient initially hesitant regarding procedure, he is now wanting procedure to be performed. Lovenox has been held, patient has been made NPO for 03/03/23. Case reviewed and approved by Dr Tommi Rumps Melchor Amour.  Risks and benefits of image-guided disc aspiration discussed with the patient including bleeding, infection, damage to adjacent structures, and sepsis.  Risks and benefits of image-guided PICC placement discussed with the patient including, but not limited to bleeding, infection, vascular injury, pneumothorax.  All of the patient's questions were answered, patient is agreeable to proceed. Consent signed and in chart.   Thank you for this interesting consult.  I greatly enjoyed meeting John Ware and look forward to participating in their care.  A copy of this report was sent to the requesting provider on this date.  Electronically Signed: Kennieth Francois, PA-C 03/02/2023, 12:38 PM   I spent a total of 20 Minutes    in face to face in clinical consultation, greater than 50% of which was counseling/coordinating care for vertebral osteomyelitis.

## 2023-03-03 ENCOUNTER — Other Ambulatory Visit (HOSPITAL_COMMUNITY): Payer: Commercial Managed Care - PPO

## 2023-03-03 DIAGNOSIS — M4620 Osteomyelitis of vertebra, site unspecified: Secondary | ICD-10-CM | POA: Diagnosis not present

## 2023-03-03 MED ORDER — BUPIVACAINE HCL (PF) 0.25 % IJ SOLN
INTRAMUSCULAR | Status: AC
  Filled 2023-03-03: qty 30

## 2023-03-03 MED ORDER — HYDROCORTISONE 1 % EX CREA
TOPICAL_CREAM | Freq: Four times a day (QID) | CUTANEOUS | Status: DC | PRN
  Administered 2023-03-03: 1 via TOPICAL
  Filled 2023-03-03: qty 28

## 2023-03-03 MED ORDER — DEXTROSE 50 % IV SOLN
INTRAVENOUS | Status: AC
  Administered 2023-03-03: 25 mL
  Filled 2023-03-03: qty 50

## 2023-03-03 MED ORDER — LIDOCAINE HCL 1 % IJ SOLN
INTRAMUSCULAR | Status: AC
  Filled 2023-03-03: qty 20

## 2023-03-03 NOTE — Plan of Care (Signed)
Patient was brought down to IR for possible disc aspiration and PICC placement under moderate sedation; however, patient could not tolerate being in prone for the procedure.   The disc aspiration and PICC placement are now scheduled for Wednesday 8:30 AM with anesthesia team.   Please call IR for questions and concerns.   Willette Brace PA-C 03/03/2023 4:39 PM

## 2023-03-03 NOTE — Progress Notes (Signed)
John Ware  WUJ:[id number] DOB: [birth year] DOA: 02/22/2023 PCP: Junie Spencer, FNP    Brief Narrative:  57 year old with a history of depression, DM2, HTN, spinal stenosis with neurogenic claudication, and chronic vertebral osteomyelitis followed by Neurosurgery and ID who was sent to the ER for premedication prior to a thoracic spine MRI.  He was unable to complete the MRI due to extreme pain.  He was therefore admitted to undergo MRI under anesthesia and for further investigation of worsening lower extremity weakness.  Consultants:  ID Neurosurgery  Goals of Care:  Code Status: Full Code   DVT prophylaxis: Lovenox  Interim Hx: No acute events reported overnight.  Afebrile.  Vital signs stable.  Utilizing his own continuous glucose monitor with CBGs well-controlled.  Reports better control of pain today with recent changes in his pain medication regimen.  Reconfirms to me that he is ready to undergo aspiration in IR as well as PICC line placement.  No new complaints today.  Assessment & Plan:  Chronic vertebral MRSE, MSSA, and Pseudomonas osteomyelitis (since 2022) with acute intractable pain MRI of the lumbar and thoracic spine 02/25/2023 under anesthesia noted findings consistent with discitis/osteomyelitis of the lower thoracic spine progressed since prior scan in September 2023 with phlegmon surrounding the thecal sac with moderate narrowing at T10-11 and severe narrowing at T11-12 - NS has evaluated and feel that surgical intervention would not be helpful at present - CT guided aspiration for culture to be completed in IR today - ID following with [place] - patient will need prolonged course of IV antibiotic with current plan to use cefepime for 6-8 weeks after aspiration is completed -initiate antibiotics as soon as CT-guided aspiration is completed  DM2 with hyperglycemia Most recent A1c 7.7 - CBG well-controlled -utilizing continuous glucose monitor  Hyperlipidemia Not on  chronic medical therapy  HTN BP reasonably controlled when not in severe pain - monitor without change for now  Thrombocytosis Stable  Hypokalemia Corrected with supplementation   Family Communication: Spoke with significant other at bedside Disposition: eventual D/C home v/s rehab    Objective: Blood pressure (!) 144/100, pulse 77, temperature 98.1 F (36.7 C), temperature source Oral, resp. rate 17, height 5\' 10"  (1.778 m), weight 81.6 kg, SpO2 94 %.  Intake/Output Summary (Last 24 hours) at 03/03/2023 0813 Last data filed at 03/03/2023 0800 Gross per 24 hour  Intake 240 ml  Output 3850 ml  Net -3610 ml    Filed Weights   02/22/23 0849 02/22/23 1449 02/25/23 1202  Weight: 81.6 kg 79.7 kg 81.6 kg    Examination: General: No acute respiratory distress Lungs: Clear to auscultation bilaterally  Cardiovascular: Regular rate and rhythm  Abdomen: Nontender, nondistended, soft, bowel sounds positive Extremities: Trace edema bilateral lower extremities  CBC: Recent Labs  Lab 02/25/23 0425 02/27/23 0411  WBC 6.8 7.0  HGB 11.2* 11.1*  HCT 34.0* 34.3*  MCV 85.0 82.5  PLT 405* 424*    Basic Metabolic Panel: Recent Labs  Lab 02/25/23 0425 02/27/23 0411  NA 134* 136  K 4.1 3.8  CL 99 100  CO2 25 26  GLUCOSE 138* 208*  BUN 7 7  CREATININE 0.60* 0.75  CALCIUM 8.9 9.0    GFR: Estimated Creatinine Clearance: 107.7 mL/min (by C-G formula based on SCr of 0.75 mg/dL).   Scheduled Meds:  baclofen  10 mg Oral TID   enoxaparin (LOVENOX) injection  40 mg Subcutaneous Q24H   insulin aspart  0-15 Units Subcutaneous TID WC  lidocaine  1 patch Transdermal Q24H   morphine  45 mg Oral Q12H     LOS: 9 days   Lonia Blood, MD [facility] Office  [phone] Pager - Text Page per Amion  If 7PM-7AM, please contact night-coverage per Amion 03/03/2023, 8:13 AM

## 2023-03-03 NOTE — Progress Notes (Signed)
Pt returned to unit procedure unable to be completed.

## 2023-03-03 NOTE — Progress Notes (Signed)
Pt continues to refuse CBG fingerstick has a blood glucose continuous monitoring patch. Per pt continuous monitoring pt CBG 88 at this time.

## 2023-03-03 NOTE — Progress Notes (Signed)
Pt continuous monitoring BG alerted a CBG of 66. Dextrose administered per protocol.   Pt off unit to IR.

## 2023-03-03 NOTE — Progress Notes (Signed)
Brief ID note  Events noted, plan for aspiration today and agree with plan as described.  Will monitor cultures and plan for prolonged IV antibiotics.    Gardiner Barefoot, MD

## 2023-03-04 DIAGNOSIS — M4620 Osteomyelitis of vertebra, site unspecified: Secondary | ICD-10-CM | POA: Diagnosis not present

## 2023-03-04 MED ORDER — ENOXAPARIN SODIUM 40 MG/0.4ML IJ SOSY
40.0000 mg | PREFILLED_SYRINGE | INTRAMUSCULAR | Status: DC
  Administered 2023-03-05: 40 mg via SUBCUTANEOUS
  Filled 2023-03-04: qty 0.4

## 2023-03-04 NOTE — Progress Notes (Signed)
John HOUGE  Ware:[id number] DOB: Jan 19, 1966 DOA: 02/22/2023 PCP: Junie Spencer, FNP    Brief Narrative:  57 year old with a history of depression, DM2, HTN, spinal stenosis with neurogenic claudication, and chronic vertebral osteomyelitis followed by Neurosurgery and ID who was sent to the ER for premedication prior to a thoracic spine MRI.  He was unable to complete the MRI due to extreme pain.  He was therefore admitted to undergo MRI under anesthesia and for further investigation of worsening lower extremity weakness.  Consultants:  ID Neurosurgery  Goals of Care:  Code Status: Full Code   DVT prophylaxis: Lovenox  Interim Hx: The patient was taken to IR yesterday to have a PICC line placed and to have an aspiration of his T11 phlegmon.  Unfortunately he was not able to tolerate proper positioning for these procedures due to intense pain, despite sedatives.  The procedures have been rescheduled for 7/10 under anesthesia.  He remains afebrile at present.  Vital signs are otherwise stable.  Pain is reasonably well-controlled but still limits his mobility at present.  No shortness of breath or chest pain.  Assessment & Plan:  Chronic vertebral MRSE, MSSA, and Pseudomonas osteomyelitis (since 2022) with acute intractable pain MRI of the lumbar and thoracic spine 02/25/2023 under anesthesia noted findings consistent with discitis/osteomyelitis of the lower thoracic spine progressed since prior scan in September 2023 with phlegmon surrounding the thecal sac with moderate narrowing at T10-11 and severe narrowing at T11-12 - NS has evaluated and feel that surgical intervention would not be helpful at present - CT guided aspiration for culture to be completed in IR 7/10 under anesthesia - ID following with [place] - patient will need prolonged course of IV antibiotic with current plan to use cefepime for 6-8 weeks after aspiration is completed - initiate antibiotics as soon as CT-guided aspiration  is completed  DM2 with hyperglycemia Most recent A1c 7.7 - CBG well-controlled - utilizing continuous glucose monitor from home  Hyperlipidemia Not on chronic medical therapy  HTN BP reasonably controlled when not in severe pain - monitor without change for now  Thrombocytosis Stable  Hypokalemia Corrected with supplementation   Family Communication: No family present at time of exam today Disposition: eventual D/C home v/s rehab    Objective: Blood pressure (!) 168/96, pulse 78, temperature 98.2 F (36.8 C), temperature source Oral, resp. rate 16, height 5\' 10"  (1.778 m), weight 81.6 kg, SpO2 98 %.  Intake/Output Summary (Last 24 hours) at 03/04/2023 0855 Last data filed at 03/04/2023 0554 Gross per 24 hour  Intake 360 ml  Output 2725 ml  Net -2365 ml    Filed Weights   02/22/23 0849 02/22/23 1449 02/25/23 1202  Weight: 81.6 kg 79.7 kg 81.6 kg    Examination: General: No acute respiratory distress Lungs: Clear to auscultation bilaterally  Cardiovascular: Regular rate and rhythm  Abdomen: Nontender, nondistended, soft, bowel sounds positive Extremities: Trace edema B LE   CBC: Recent Labs  Lab 02/27/23 0411  WBC 7.0  HGB 11.1*  HCT 34.3*  MCV 82.5  PLT 424*    Basic Metabolic Panel: Recent Labs  Lab 02/27/23 0411  NA 136  K 3.8  CL 100  CO2 26  GLUCOSE 208*  BUN 7  CREATININE 0.75  CALCIUM 9.0    GFR: Estimated Creatinine Clearance: 107.7 mL/min (by C-G formula based on SCr of 0.75 mg/dL).   Scheduled Meds:  baclofen  10 mg Oral TID   enoxaparin (LOVENOX) injection  40 mg Subcutaneous Q24H   lidocaine  1 patch Transdermal Q24H   morphine  45 mg Oral Q12H     LOS: 10 days   Lonia Blood, MD [facility] Office  [phone] Pager - Text Page per Loretha Stapler  If 7PM-7AM, please contact night-coverage per Amion 03/04/2023, 8:55 AM

## 2023-03-04 NOTE — Progress Notes (Signed)
Patient ID: John Ware, male   DOB: [birth year], 57 y.o.   MRN: [id number] I have reviewed the patient's MRI of his thoracic and lumbar spines.  There is phlegmon with inflammation in the thoracolumbar junction from T10-T12.  I agree that this would be a very difficult and precarious area to do an adequate decompression and stabilization and currently surgery would be extremely risky.  I agree with my partner's assessment that be best to treat with antibiotics and observe his clinical situation as the chance of him developing a paralysis as a result of surgery is equally as great as him developing paralysis being treated conservatively.  We will follow along.

## 2023-03-04 NOTE — Anesthesia Preprocedure Evaluation (Signed)
Anesthesia Evaluation  Patient identified by MRN, date of birth, ID band Patient awake    Reviewed: Allergy & Precautions, H&P , NPO status , Patient's Chart, lab work & pertinent test results  Airway Mallampati: II   Neck ROM: full    Dental  (+) Dental Advisory Given, Chipped   Pulmonary Patient abstained from smoking., former smoker   breath sounds clear to auscultation       Cardiovascular hypertension, +CHF and + DOE   Rhythm:regular Rate:Normal  ECHO 2022:  1. Left ventricular ejection fraction, by estimation, is 60 to 65%. The  left ventricle has normal function. The left ventricle has no regional  wall motion abnormalities. There is mild left ventricular hypertrophy.  Left ventricular diastolic parameters  are consistent with Grade I diastolic dysfunction (impaired relaxation).   2. Right ventricular systolic function is normal. The right ventricular  size is normal.   3. The mitral valve is normal in structure. No evidence of mitral valve  regurgitation. No evidence of mitral stenosis.   4. No obvious tricuspid valve vegetation seen (prior TEE vegetation was  apparant).   5. The aortic valve is normal in structure. Aortic valve regurgitation is  not visualized. No aortic stenosis is present.   6. The inferior vena cava is normal in size with greater than 50%  respiratory variability, suggesting right atrial pressure of 3 mmHg.   Conclusion(s)/Recommendation(s): No evidence of valvular vegetations on  this transthoracic echocardiogram. Would recommend a transesophageal  echocardiogram to exclude infective endocarditis if clinically indicated.     Neuro/Psych  PSYCHIATRIC DISORDERS Anxiety Depression     Neuromuscular disease    GI/Hepatic negative GI ROS, Neg liver ROS,,,  Endo/Other  diabetes    Renal/GU      Musculoskeletal  (+) Arthritis , Osteoarthritis,    Abdominal   Peds  Hematology  (+) Blood  dyscrasia, anemia   Anesthesia Other Findings   Reproductive/Obstetrics                             Anesthesia Physical Anesthesia Plan  ASA: 3  Anesthesia Plan: General   Post-op Pain Management: Minimal or no pain anticipated   Induction: Intravenous  PONV Risk Score and Plan: 2 and Ondansetron, Midazolam, Treatment may vary due to age or medical condition and Dexamethasone  Airway Management Planned: Oral ETT and Video Laryngoscope Planned  Additional Equipment: None  Intra-op Plan:   Post-operative Plan: Extubation in OR  Informed Consent: I have reviewed the patients History and Physical, chart, labs and discussed the procedure including the risks, benefits and alternatives for the proposed anesthesia with the patient or authorized representative who has indicated his/her understanding and acceptance.     Dental advisory given  Plan Discussed with: CRNA  Anesthesia Plan Comments:        Anesthesia Quick Evaluation

## 2023-03-05 ENCOUNTER — Other Ambulatory Visit (HOSPITAL_COMMUNITY): Payer: Self-pay | Admitting: Interventional Radiology

## 2023-03-05 ENCOUNTER — Encounter (HOSPITAL_COMMUNITY): Payer: Self-pay

## 2023-03-05 ENCOUNTER — Inpatient Hospital Stay (HOSPITAL_COMMUNITY): Payer: Medicare Other | Admitting: Anesthesiology

## 2023-03-05 ENCOUNTER — Encounter (HOSPITAL_COMMUNITY)
Admission: EM | Disposition: A | Discharge status: Home Health | Payer: Self-pay | Source: Ambulatory Visit | Attending: Internal Medicine

## 2023-03-05 ENCOUNTER — Inpatient Hospital Stay (HOSPITAL_COMMUNITY): Payer: Medicare Other

## 2023-03-05 ENCOUNTER — Other Ambulatory Visit: Payer: Self-pay

## 2023-03-05 DIAGNOSIS — Z87891 Personal history of nicotine dependence: Secondary | ICD-10-CM

## 2023-03-05 DIAGNOSIS — M4624 Osteomyelitis of vertebra, thoracic region: Secondary | ICD-10-CM

## 2023-03-05 DIAGNOSIS — I5032 Chronic diastolic (congestive) heart failure: Secondary | ICD-10-CM | POA: Diagnosis not present

## 2023-03-05 DIAGNOSIS — M4620 Osteomyelitis of vertebra, site unspecified: Secondary | ICD-10-CM | POA: Diagnosis not present

## 2023-03-05 DIAGNOSIS — I110 Hypertensive heart disease with heart failure: Secondary | ICD-10-CM | POA: Diagnosis not present

## 2023-03-05 HISTORY — PX: IR FLUORO GUIDED NEEDLE PLC ASPIRATION/INJECTION LOC: IMG2395

## 2023-03-05 HISTORY — PX: RADIOLOGY WITH ANESTHESIA: SHX6223

## 2023-03-05 LAB — CBC WITH DIFFERENTIAL/PLATELET
Abs Immature Granulocytes: 0.02 10*3/uL (ref 0.00–0.07)
Basophils Absolute: 0 10*3/uL (ref 0.0–0.1)
Basophils Relative: 1 %
Eosinophils Absolute: 0.3 10*3/uL (ref 0.0–0.5)
Eosinophils Relative: 4 %
HCT: 38 % — ABNORMAL LOW (ref 39.0–52.0)
Hemoglobin: 12.4 g/dL — ABNORMAL LOW (ref 13.0–17.0)
Immature Granulocytes: 0 %
Lymphocytes Relative: 31 %
Lymphs Abs: 2.1 10*3/uL (ref 0.7–4.0)
MCH: 27.3 pg (ref 26.0–34.0)
MCHC: 32.6 g/dL (ref 30.0–36.0)
MCV: 83.7 fL (ref 80.0–100.0)
Monocytes Absolute: 0.8 10*3/uL (ref 0.1–1.0)
Monocytes Relative: 11 %
Neutro Abs: 3.7 10*3/uL (ref 1.7–7.7)
Neutrophils Relative %: 53 %
Platelets: 426 10*3/uL — ABNORMAL HIGH (ref 150–400)
RBC: 4.54 MIL/uL (ref 4.22–5.81)
RDW: 15.1 % (ref 11.5–15.5)
WBC: 6.9 10*3/uL (ref 4.0–10.5)
nRBC: 0 % (ref 0.0–0.2)

## 2023-03-05 LAB — GLUCOSE, CAPILLARY
Glucose-Capillary: 126 mg/dL — ABNORMAL HIGH (ref 70–99)
Glucose-Capillary: 153 mg/dL — ABNORMAL HIGH (ref 70–99)

## 2023-03-05 LAB — PROTIME-INR
INR: 1.1 (ref 0.8–1.2)
Prothrombin Time: 13.9 seconds (ref 11.4–15.2)

## 2023-03-05 SURGERY — IR WITH ANESTHESIA
Anesthesia: General

## 2023-03-05 MED ORDER — ROCURONIUM BROMIDE 10 MG/ML (PF) SYRINGE
PREFILLED_SYRINGE | INTRAVENOUS | Status: DC | PRN
  Administered 2023-03-05: 10 mg via INTRAVENOUS
  Administered 2023-03-05: 50 mg via INTRAVENOUS

## 2023-03-05 MED ORDER — FENTANYL CITRATE (PF) 100 MCG/2ML IJ SOLN
INTRAMUSCULAR | Status: AC
  Filled 2023-03-05: qty 2

## 2023-03-05 MED ORDER — OXYCODONE HCL 5 MG PO TABS
5.0000 mg | ORAL_TABLET | Freq: Once | ORAL | Status: AC | PRN
  Administered 2023-03-05: 5 mg via ORAL

## 2023-03-05 MED ORDER — OXYCODONE HCL 5 MG/5ML PO SOLN: 5.0000 mg | Freq: Once | ORAL | Status: AC | PRN

## 2023-03-05 MED ORDER — FENTANYL CITRATE (PF) 100 MCG/2ML IJ SOLN: 25.0000 ug | INTRAMUSCULAR | Status: DC | PRN

## 2023-03-05 MED ORDER — KETAMINE HCL 10 MG/ML IJ SOLN
INTRAMUSCULAR | Status: DC | PRN
  Administered 2023-03-05: 20 mg via INTRAVENOUS

## 2023-03-05 MED ORDER — PROMETHAZINE HCL 25 MG/ML IJ SOLN: 6.2500 mg | INTRAMUSCULAR | Status: DC | PRN

## 2023-03-05 MED ORDER — INSULIN ASPART 100 UNIT/ML IJ SOLN: 0.0000 [IU] | INTRAMUSCULAR | Status: DC | PRN

## 2023-03-05 MED ORDER — SODIUM CHLORIDE 0.9 % IV SOLN
2.0000 g | Freq: Three times a day (TID) | INTRAVENOUS | Status: DC
  Administered 2023-03-05 – 2023-03-06 (×4): 2 g via INTRAVENOUS
  Filled 2023-03-05 (×4): qty 12.5

## 2023-03-05 MED ORDER — LIDOCAINE HCL 1 % IJ SOLN
INTRAMUSCULAR | Status: AC
  Filled 2023-03-05: qty 20

## 2023-03-05 MED ORDER — AMISULPRIDE (ANTIEMETIC) 5 MG/2ML IV SOLN: 10.0000 mg | Freq: Once | INTRAVENOUS | Status: DC | PRN

## 2023-03-05 MED ORDER — PROPOFOL 10 MG/ML IV BOLUS
INTRAVENOUS | Status: DC | PRN
  Administered 2023-03-05: 150 mg via INTRAVENOUS

## 2023-03-05 MED ORDER — MIDAZOLAM HCL 2 MG/2ML IJ SOLN
INTRAMUSCULAR | Status: DC | PRN
  Administered 2023-03-05: 2 mg via INTRAVENOUS

## 2023-03-05 MED ORDER — DEXAMETHASONE SODIUM PHOSPHATE 10 MG/ML IJ SOLN
INTRAMUSCULAR | Status: DC | PRN
  Administered 2023-03-05: 10 mg via INTRAVENOUS

## 2023-03-05 MED ORDER — KETAMINE HCL 50 MG/5ML IJ SOSY
PREFILLED_SYRINGE | INTRAMUSCULAR | Status: AC
  Filled 2023-03-05: qty 5

## 2023-03-05 MED ORDER — LIDOCAINE HCL 1 % IJ SOLN: 20.0000 mL | Freq: Once | INTRAMUSCULAR | Status: DC

## 2023-03-05 MED ORDER — FENTANYL CITRATE (PF) 100 MCG/2ML IJ SOLN
25.0000 ug | INTRAMUSCULAR | Status: DC | PRN
  Administered 2023-03-05 (×3): 50 ug via INTRAVENOUS

## 2023-03-05 MED ORDER — LIDOCAINE HCL (PF) 1 % IJ SOLN
INTRAMUSCULAR | Status: AC
  Filled 2023-03-05: qty 30

## 2023-03-05 MED ORDER — CHLORHEXIDINE GLUCONATE CLOTH 2 % EX PADS
6.0000 | MEDICATED_PAD | Freq: Every day | CUTANEOUS | Status: DC
  Administered 2023-03-05: 6 via TOPICAL

## 2023-03-05 MED ORDER — HYDROMORPHONE HCL 1 MG/ML IJ SOLN
INTRAMUSCULAR | Status: AC
  Filled 2023-03-05: qty 1

## 2023-03-05 MED ORDER — LACTATED RINGERS IV SOLN: INTRAVENOUS | Status: DC

## 2023-03-05 MED ORDER — MEPERIDINE HCL 25 MG/ML IJ SOLN: 6.2500 mg | INTRAMUSCULAR | Status: DC | PRN

## 2023-03-05 MED ORDER — OXYCODONE HCL 5 MG PO TABS
ORAL_TABLET | ORAL | Status: AC
  Filled 2023-03-05: qty 1

## 2023-03-05 MED ORDER — FENTANYL CITRATE (PF) 250 MCG/5ML IJ SOLN
INTRAMUSCULAR | Status: DC | PRN
  Administered 2023-03-05: 100 ug via INTRAVENOUS

## 2023-03-05 MED ORDER — HYDROMORPHONE HCL 1 MG/ML IJ SOLN
0.2500 mg | INTRAMUSCULAR | Status: DC | PRN
  Administered 2023-03-05 (×2): 0.5 mg via INTRAVENOUS

## 2023-03-05 MED ORDER — SUGAMMADEX SODIUM 200 MG/2ML IV SOLN
INTRAVENOUS | Status: DC | PRN
  Administered 2023-03-05: 200 mg via INTRAVENOUS

## 2023-03-05 MED ORDER — CHLORHEXIDINE GLUCONATE 0.12 % MT SOLN
15.0000 mL | Freq: Once | OROMUCOSAL | Status: AC
  Administered 2023-03-05: 15 mL via OROMUCOSAL

## 2023-03-05 MED ORDER — LIDOCAINE 2% (20 MG/ML) 5 ML SYRINGE
INTRAMUSCULAR | Status: DC | PRN
  Administered 2023-03-05: 25 mg via INTRAVENOUS

## 2023-03-05 MED ORDER — ONDANSETRON HCL 4 MG/2ML IJ SOLN
INTRAMUSCULAR | Status: DC | PRN
  Administered 2023-03-05: 4 mg via INTRAVENOUS

## 2023-03-05 MED ORDER — ONDANSETRON HCL 4 MG/2ML IJ SOLN: 4.0000 mg | Freq: Four times a day (QID) | INTRAMUSCULAR | Status: DC | PRN

## 2023-03-05 MED ORDER — ORAL CARE MOUTH RINSE: 15.0000 mL | Freq: Once | OROMUCOSAL | Status: AC

## 2023-03-05 MED ORDER — MIDAZOLAM HCL 2 MG/2ML IJ SOLN
INTRAMUSCULAR | Status: AC
  Filled 2023-03-05: qty 2

## 2023-03-05 NOTE — Progress Notes (Signed)
    [facility]   Reason for visit: Follow up on  vertebral infection  Interval History: remains afebrile, WBC 6.9; picc in place now though he is complaining about it being a double lumen   Physical Exam: Constitutional:  Vitals:   03/05/23 1115 03/05/23 1130  BP: (!) 149/96 (!) 132/95  Pulse: 81 79  Resp: 12 11  Temp:  (!) 97 F (36.1 C)  SpO2: 98% 96%   patient appears in NAD Eyes: anicteric HENT: no thrush Respiratory: Normal respiratory effort  Review of Systems: Constitutional: negative for fevers and chills  Lab Results  Component Value Date   WBC 6.9 03/05/2023   HGB 12.4 (L) 03/05/2023   HCT 38.0 (L) 03/05/2023   MCV 83.7 03/05/2023   PLT 426 (H) 03/05/2023    Lab Results  Component Value Date   CREATININE 0.75 02/27/2023   BUN 7 02/27/2023   NA 136 02/27/2023   K 3.8 02/27/2023   CL 100 02/27/2023   CO2 26 02/27/2023    Lab Results  Component Value Date   ALT 15 02/23/2023   AST 17 02/23/2023   ALKPHOS 85 02/23/2023     Microbiology: No results found for this or any previous visit (from the past 240 hour(s)).  Impression/Plan:  1. Vertebral infection - previous cultures with Pseudomonas, MSSE, MSSA.  New sample sent.  No surgery planned at this time.   At this time, will plan to cover all with cefepime and plan for an 8 week course with reevaluation.   Will likely need a repeat MRI at that time to reevaluate.    Diagnosis: Vertebral infection  Culture Result: Pseudomonas, MSSA, MSSE  Allergies  Allergen Reactions   Tizanidine Other (See Comments)    Leg cramps    OPAT Orders Discharge antibiotics to be given via PICC line Discharge antibiotics: cefepime 2 grams IV three times a day Per pharmacy protocol yes Duration: 8 weeks End Date: April 23, 2023  [facility] Care Per Protocol: yes  Home health RN for IV administration and teaching; PICC line care and labs.    Labs weekly while on IV antibiotics: _x_  CBC with differential __ BMP _x_ CMP _x_ CRP _x_ ESR __ Vancomycin trough __ CK  __ Please pull PIC at completion of IV antibiotics _x_ Please leave PIC in place until doctor has seen patient or been notified  Fax weekly labs to [phone]  Clinic Follow Up Appt: Dr. Elinor Parkinson August 13, 11;15am

## 2023-03-05 NOTE — Progress Notes (Addendum)
PICC exchange request from Dr. Luciana Axe for single lumen PICC.  PICC was placed today by interventional radiology.  Securechat to Dr. Luciana Axe to reach out to IR to exchange PICC. Addendum-[phone] Dr. Luciana Axe acknowledged securechat with thumbs up.

## 2023-03-05 NOTE — Transfer of Care (Signed)
Immediate Anesthesia Transfer of Care Note  Patient: John Ware  Procedure(s) Performed: disc aspiration  Patient Location: PACU  Anesthesia Type:General  Level of Consciousness: awake, alert , and oriented  Airway & Oxygen Therapy: Patient Spontanous Breathing  Post-op Assessment: Report given to RN, Post -op Vital signs reviewed and stable, Patient moving all extremities X 4, and claims difficulty moving upper extremitites. Right > L.  Proceceduralist and and MDA at bedside.  Post vital signs: stable  Last Vitals:  Vitals Value Taken Time  BP 150/94 03/05/23 1047  Temp    Pulse 77 03/05/23 1053  Resp 24 03/05/23 1053  SpO2 100 % 03/05/23 1053  Vitals shown include unvalidated device data.  Last Pain:  Vitals:   03/05/23 0749  TempSrc:   PainSc: 4       Patients Stated Pain Goal: 2 (03/03/23 0741)  Complications: appreas anxious about arm strength but moving more than when first extubated

## 2023-03-05 NOTE — Procedures (Signed)
INTERVENTIONAL NEURORADIOLOGY BRIEF POSTPROCEDURE NOTE  T10-11 AND T11-12 INTERVERTEBRAL DISC FINE NEEDLE ASPIRATION  Attending: Dr. Baldemar Lenis  Diagnosis: Discitis/osteomyelitis  Access site: Percutaneous  Anesthesia: GETA  Complications: None  Estimated blood loss: None  Specimen: 4 FNA samples obtained - sent for stain and culture  Findings: Changes from chronic discitis/osteomyelitis at T10-11 and T11-12. 2 FNA samples collected at each level.  The patient tolerated the procedure well without incident or complication and is in stable condition.

## 2023-03-05 NOTE — Sedation Documentation (Addendum)
Pt prepped prone, body padded appropriately per anesthesia team and IR staff with chest rolls and arm padding

## 2023-03-05 NOTE — Anesthesia Procedure Notes (Signed)
Procedure Name: Intubation Date/Time: 03/05/2023 8:47 AM  Performed by: Cheree Ditto, CRNAPre-anesthesia Checklist: Patient identified, Emergency Drugs available, Suction available and Patient being monitored Patient Re-evaluated:Patient Re-evaluated prior to induction Oxygen Delivery Method: Circle system utilized Preoxygenation: Pre-oxygenation with 100% oxygen Induction Type: IV induction Ventilation: Mask ventilation without difficulty Laryngoscope Size: Glidescope and 4 Grade View: Grade I Tube type: Oral Number of attempts: 1 Airway Equipment and Method: Stylet and Oral airway Placement Confirmation: ETT inserted through vocal cords under direct vision, positive ETCO2 and breath sounds checked- equal and bilateral Secured at: 24 cm Tube secured with: Tape Dental Injury: Teeth and Oropharynx as per pre-operative assessment

## 2023-03-05 NOTE — Progress Notes (Signed)
PROGRESS NOTE    DODGE ATOR  ZOX:[id number] DOB: [birth year] DOA: 02/22/2023 PCP: Junie Spencer, FNP    Brief Narrative:   John Ware is a 57 y.o. male with past medical history of depression, type 2 diabetes, hypertension,  spinal stenosis with neurogenic claudication, chronic vertebral osteomyelitis who has been treated recently with cefadroxil and doxycycline, active with neurosurgery and ID follow-up was sent to the ED to get premedicated for thoracic spine MRI.  Unfortunately, patient was unable to do the scan due to extreme pain.  He had been having increasing weakness of his lower extremity.  In the ED, patient was slightly tachycardic with elevated blood pressure at 137/102.  Patient had no leukocytosis.  There was mild hypokalemia at 3.4. Patient  was considered for admission to the hospital for further evaluation.  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  On  IV Dilaudid, oxycodone, long-acting morphine, baclofen, Lidoderm patch,.  Initially was unable to lie down for MRI but MRI was performed with anesthesia on 02/25/2023 with findings consistent with discitis and stability of the lower thoracic spine progressed since September 2023 with phlegmon with severe narrowing of T10-T11 and T11-T12 disc spaces.  Neurosurgery with opinion that surgical intervention would not be helpful and might be equally risky compared to conservative treatment so CT-guided aspiration and culture has been done on 7/10 under anesthesia.  Follow-up cultures from IR guided aspiration including fungal culture, AFB stain, aerobic and anaerobic culture..  ID has recommended IV cefepime on discharge 2 g 3 times daily for 8 weeks end of treatment April 23, 2023 via PICC line..  Blood cultures negative.  Patient  seen by neurosurgery and ID.  Patient does not have leukocytosis or fever at this time.    DM2 (diabetes mellitus, type 2)  with hyperglycemia. Latest hemoglobin A1c was 7.7.  Continue sliding scale insulin diabetic diet.  Controlled at this time     Hyperlipidemia associated with type 2 diabetes mellitus (HCC) Currently not on medical therapy.     HTN (hypertension) BP controlled when not in pain.     Thrombocytosis Improved     DVT prophylaxis: enoxaparin (LOVENOX) injection 40 mg Start: 03/05/23 2000   Code Status:     Code Status: Full Code  Disposition: Home likely on 03/06/2023.  Status is: Inpatient  Remains inpatient appropriate because: Ongoing pain, status post IR guided aspiration and biopsy,    Family Communication: Spoke with the patient's spouse at bedside.  Consultants:  Infectious disease Neurosurgery Interventional radiology  Procedures:  IR guided disc aspiration under anesthesia on 03/05/2023. PICC line placement  Antimicrobials:  Cefepime 2 g IV 3 times daily.  Anti-infectives (From admission, onward)    Start     Dose/Rate Route Frequency Ordered Stop   03/05/23 1515  ceFEPIme (MAXIPIME) 2 g in sodium chloride 0.9 % 100 mL IVPB        2 g 200 mL/hr over 30 Minutes Intravenous Every 8 hours 03/05/23 1421     02/22/23 2200  doxycycline (VIBRA-TABS) tablet 100 mg  Status:  Discontinued        100 mg Oral Every 12 hours 02/22/23 1509 02/26/23 0908   02/22/23 2200  cefadroxil (DURICEF) capsule 1,000 mg  Status:  Discontinued        1,000 mg Oral 2 times daily 02/22/23 1509 02/26/23 0908       Subjective: Today, patient was seen and examined at  bedside.  Complains of pain especially on standing or doing something.  Patient's spouse at bedside.  Seen after IR guided aspiration.  Objective: Vitals:   03/05/23 1045 03/05/23 1100 03/05/23 1115 03/05/23 1130  BP: (!) 150/94 (!) 153/102 (!) 149/96 (!) 132/95  Pulse: 84 72 81 79  Resp: 17 12 12 11   Temp: (!) 97 F (36.1 C)   (!) 97 F (36.1 C)  TempSrc:      SpO2: 100% 99% 98% 96%  Weight:      Height:         Intake/Output Summary (Last 24 hours) at 03/05/2023 1434 Last data filed at 03/05/2023 1053 Gross per 24 hour  Intake 400 ml  Output 2905 ml  Net -2505 ml    Filed Weights   02/22/23 1449 02/25/23 1202 03/05/23 0743  Weight: 79.7 kg 81.6 kg 81.6 kg    Physical Examination: Body mass index is 25.83 kg/m.   General:  Average built, sitting up in bed.  Older than stated age.   HENT:   No scleral pallor or icterus noted. Oral mucosa is moist.  Chest:  Clear breath sounds.   No crackles or wheezes.  Tenderness on the back. CVS: S1 &S2 heard. No murmur.  Regular rate and rhythm. Abdomen: Soft, nontender, nondistended.  Bowel sounds are heard.   Extremities: No cyanosis, clubbing or edema.  Peripheral pulses are palpable. Psych: Alert, awake and oriented, normal mood CNS:  No cranial nerve deficits.  Moving extremities. Skin: Warm and dry.  No rashes noted.  Data Reviewed:   CBC: Recent Labs  Lab 02/27/23 0411 03/05/23 0451  WBC 7.0 6.9  NEUTROABS  --  3.7  HGB 11.1* 12.4*  HCT 34.3* 38.0*  MCV 82.5 83.7  PLT 424* 426*     Basic Metabolic Panel: Recent Labs  Lab 02/27/23 0411  NA 136  K 3.8  CL 100  CO2 26  GLUCOSE 208*  BUN 7  CREATININE 0.75  CALCIUM 9.0     Liver Function Tests: No results for input(s): "AST", "ALT", "ALKPHOS", "BILITOT", "PROT", "ALBUMIN" in the last 168 hours.    Radiology Studies: IR Fluoro Guide Ndl Plmt / BX  Result Date: 03/05/2023 INDICATION: Chronic discitis/osteomyelitis. EXAM: FLUOROSCOPY GUIDED T10-11 AND T11-12 INTERVERTEBRAL DISC FINE-NEEDLE ASPIRATION MEDICATIONS: None. ANESTHESIA/SEDATION: The procedure was performed under general anesthesia. COMPLICATIONS: None immediate. Radiation exposure: DAP 3249 mGy*m2. PROCEDURE: Informed written consent was obtained from the patient after a thorough discussion of the procedural risks, benefits and alternatives. All questions were addressed. Maximal Sterile Barrier Technique was  utilized including caps, mask, sterile gowns, sterile gloves, sterile drape, hand hygiene and skin antiseptic. A timeout was performed prior to the initiation of the procedure. The patient was placed in prone position on the angiography table. The thoracic spine region was prepped and draped in a sterile fashion. Under fluoroscopy, the T11-12 disc space was delineated and the skin area was marked. Endplate erosion and sclerosis consistent with chronic osteomyelitis is seen. The skin was infiltrated with a 1% Lidocaine approximately 4 cm lateral to the spinous process projection on the left. Subsequently, an 18 gauge trocar needle was advanced under fluoroscopy guided into the T11-12 disc space. The mandrel was removed and 2 fine-needle aspiration samples were obtained. Small amount of blood tinged fluid was collected in each sample. The needle was subsequently withdrawn. Under fluoroscopy, the T10-11 disc space was delineated and the skin area was marked. Endplate erosion and sclerosis consistent with chronic osteomyelitis is  seen. The skin was infiltrated with a 1% Lidocaine approximately 5 cm lateral to the spinous process projection on the left. Subsequently, an 18 gauge trocar needle was advanced under fluoroscopy guided into the T10-11 disc space. The mandrel was removed and 2 fine-needle aspiration samples were obtained. Small amount of blood tinged fluid was collected in each sample. The needle was subsequently withdrawn. The access sites were cleaned and covered with a sterile bandage. IMPRESSION: Successful fluoroscopy guided T10-11 and T11-12 disc fine needle aspiration. Electronically Signed   By: Baldemar Lenis M.D.   On: 03/05/2023 12:56   IR Fluoro Guide Ndl Plmt / BX  Result Date: 03/05/2023 INDICATION: Chronic discitis/osteomyelitis. EXAM: FLUOROSCOPY GUIDED T10-11 AND T11-12 INTERVERTEBRAL DISC FINE-NEEDLE ASPIRATION MEDICATIONS: None. ANESTHESIA/SEDATION: The procedure was  performed under general anesthesia. COMPLICATIONS: None immediate. Radiation exposure: DAP 3249 mGy*m2. PROCEDURE: Informed written consent was obtained from the patient after a thorough discussion of the procedural risks, benefits and alternatives. All questions were addressed. Maximal Sterile Barrier Technique was utilized including caps, mask, sterile gowns, sterile gloves, sterile drape, hand hygiene and skin antiseptic. A timeout was performed prior to the initiation of the procedure. The patient was placed in prone position on the angiography table. The thoracic spine region was prepped and draped in a sterile fashion. Under fluoroscopy, the T11-12 disc space was delineated and the skin area was marked. Endplate erosion and sclerosis consistent with chronic osteomyelitis is seen. The skin was infiltrated with a 1% Lidocaine approximately 4 cm lateral to the spinous process projection on the left. Subsequently, an 18 gauge trocar needle was advanced under fluoroscopy guided into the T11-12 disc space. The mandrel was removed and 2 fine-needle aspiration samples were obtained. Small amount of blood tinged fluid was collected in each sample. The needle was subsequently withdrawn. Under fluoroscopy, the T10-11 disc space was delineated and the skin area was marked. Endplate erosion and sclerosis consistent with chronic osteomyelitis is seen. The skin was infiltrated with a 1% Lidocaine approximately 5 cm lateral to the spinous process projection on the left. Subsequently, an 18 gauge trocar needle was advanced under fluoroscopy guided into the T10-11 disc space. The mandrel was removed and 2 fine-needle aspiration samples were obtained. Small amount of blood tinged fluid was collected in each sample. The needle was subsequently withdrawn. The access sites were cleaned and covered with a sterile bandage. IMPRESSION: Successful fluoroscopy guided T10-11 and T11-12 disc fine needle aspiration. Electronically Signed    By: Baldemar Lenis M.D.   On: 03/05/2023 12:56      LOS: 11 days    Joycelyn Das, MD [facility] Available via Epic secure chat 7am-7pm After these hours, please refer to coverage provider listed on [email or web address] 03/05/2023, 2:34 PM

## 2023-03-05 NOTE — Procedures (Signed)
PROCEDURE SUMMARY:  Successful placement of dual lumen PICC line to right brachial vein. Length 40cm Tip at lower SVC/RA No complications PICC capped Ready for use. EBL = trace  *Procedure technically difficult secondary to numerous previous PICCs in the right arm. Consider left arm if PICC needed again in the future.  Please see full dictation in Imaging section for details.   Djuna Frechette S Yizel Canby PA-C 03/05/2023 10:37 AM

## 2023-03-05 NOTE — Sedation Documentation (Addendum)
Pt under care of anesthesia please anesthesia records see charting / notes

## 2023-03-05 NOTE — Progress Notes (Addendum)
PHARMACY CONSULT NOTE FOR:  OUTPATIENT  PARENTERAL ANTIBIOTIC THERAPY (OPAT)  Indication: Vertebral osteomyelitis  Regimen: cefepime 2g IV Q8H End date: 04/23/2023  IV antibiotic discharge orders are pended. To discharging provider:  please sign these orders via discharge navigator,  Select New Orders & click on the button choice - Manage This Unsigned Work.     Thank you for allowing pharmacy to be a part of this patient's care.  Lendon Ka, PharmD Candidate 03/05/2023, 2:31 PM

## 2023-03-05 NOTE — H&P (Addendum)
HPI:  The patient has had a H&P performed within the last 30 days, all history, medications, and exam have been reviewed. The patient denies any interval changes since the H&P.  Medications: Prior to Admission medications   Medication Sig Start Date End Date Taking? Authorizing Provider  acetaminophen (TYLENOL) 500 MG tablet Take 1 tablet (500 mg total) by mouth every 6 (six) hours as needed. 06/26/21  Yes Love, Evlyn Kanner, PA-C  cefadroxil (DURICEF) 500 MG capsule Take 2 capsules (1,000 mg total) by mouth 2 (two) times daily. 01/30/23  Yes Odette Fraction, MD  doxycycline (VIBRA-TABS) 100 MG tablet Take 1 tablet (100 mg total) by mouth every 12 (twelve) hours. 12/09/22 12/04/23 Yes Dorcas Carrow, MD  gabapentin (NEURONTIN) 400 MG capsule Take 1 capsule (400 mg total) by mouth 2 (two) times daily. 08/16/22  Yes Hawks, Christy A, FNP  hydrOXYzine (ATARAX) 10 MG tablet Take 1 tablet (10 mg total) by mouth 3 (three) times daily. 02/05/23  Yes   morphine (KADIAN) 30 MG 24 hr capsule Take 1 capsule (30 mg total) by mouth every 12 (twelve) hours. 02/05/23  Yes   naloxone (NARCAN) 0.4 MG/ML injection Inject 1 mL (0.4 mg total) into the vein as needed. 06/26/21  Yes Love, Evlyn Kanner, PA-C  oxyCODONE-acetaminophen (PERCOCET) 10-325 MG tablet Take 1 tablet by mouth 5 times daily. 02/05/23  Yes   Baclofen 5 MG TABS Take 1 tablet (5 mg total) by mouth 3 (three) times daily. 02/05/23     Continuous Blood Gluc Sensor (FREESTYLE LIBRE 2 SENSOR) MISC CHECK BLOOD SUGAR 4 TIMES A DAY 08/22/22   Jannifer Rodney A, FNP     Vital Signs: BP 123/87   Pulse 76   Temp 97.7 F (36.5 C) (Oral)   Resp 17   Ht 5\' 10"  (1.778 m)   Wt 180 lb (81.6 kg)   SpO2 93%   BMI 25.83 kg/m   Physical Exam Vitals reviewed.  Constitutional:      General: He is not in acute distress.    Appearance: He is not ill-appearing.  HENT:     Head: Normocephalic.     Mouth/Throat:     Mouth: Mucous membranes are moist.     Pharynx:  Oropharynx is clear.  Cardiovascular:     Rate and Rhythm: Normal rate and regular rhythm.     Heart sounds: Normal heart sounds.  Pulmonary:     Effort: Pulmonary effort is normal.     Breath sounds: Normal breath sounds.  Abdominal:     General: Abdomen is flat. Bowel sounds are normal.     Palpations: Abdomen is soft.  Musculoskeletal:     Cervical back: Neck supple.     Comments: TTP mid-lower back   Skin:    General: Skin is warm and dry.     Coloration: Skin is not jaundiced or pale.  Neurological:     Mental Status: He is alert and oriented to person, place, and time.  Psychiatric:        Mood and Affect: Mood normal.        Behavior: Behavior normal.        Judgment: Judgment normal.     Mallampati Score:  MD Evaluation Airway: WNL Heart: WNL Abdomen: WNL Chest/ Lungs: WNL ASA  Classification: 3 Mallampati/Airway Score: Two  Labs:  CBC: Recent Labs    02/24/23 0413 02/25/23 0425 02/27/23 0411 03/05/23 0451  WBC 9.7 6.8 7.0 6.9  HGB 11.3* 11.2* 11.1* 12.4*  HCT 34.9* 34.0* 34.3* 38.0*  PLT 365 405* 424* 426*    COAGS: Recent Labs    04/24/22 0956 12/05/22 0159 03/05/23 0451  INR 1.1 1.1 1.1    BMP: Recent Labs    02/23/23 0230 02/24/23 0413 02/25/23 0425 02/27/23 0411  NA 136 132* 134* 136  K 4.1 4.4 4.1 3.8  CL 98 95* 99 100  CO2 27 27 25 26   GLUCOSE 127* 175* 138* 208*  BUN 14 11 7 7   CALCIUM 8.8* 8.7* 8.9 9.0  CREATININE 0.81 0.73 0.60* 0.75  GFRNONAA >60 >60 >60 >60    LIVER FUNCTION TESTS: Recent Labs    04/23/22 0257 05/09/22 0358 12/03/22 1047 02/23/23 0230  BILITOT 0.4 0.5 0.5 0.9  AST 13* 14* 24 17  ALT 11 18 18 15   ALKPHOS 75 92 123 85  PROT 7.1 6.7 7.6 6.0*  ALBUMIN 3.1* 2.8* 3.7 3.0*    Assessment/Plan:  Recurrent vertebral osteomyelitis, back pain, in need of long ter, IV abx tx. Request for T10-11 and T11-12 disc aspiration and PICC placement.   VSS CBC stable  INR 1.1 today   Disc aspiration  could not be performed with moderate sedation due to severe back pain, will proceed with disc aspiration and PICC placement with anesthesia team.   Risks and benefits of image-guided T10-11 and T11-12 disc aspiration discussed with the patient including bleeding, infection, damage to adjacent structures, and sepsis.   Risks and benefits of image-guided PICC placement discussed with the patient including, but not limited to bleeding, infection, vascular injury, pneumothorax.   All of the patient's questions were answered, patient is agreeable to proceed. Consent signed and in chart.  Signed: Johany Hansman H Meital Riehl 03/05/2023, 8:00 AM

## 2023-03-06 ENCOUNTER — Encounter (HOSPITAL_COMMUNITY): Payer: Self-pay | Admitting: Neuroradiology

## 2023-03-06 ENCOUNTER — Other Ambulatory Visit (HOSPITAL_COMMUNITY): Payer: Self-pay

## 2023-03-06 DIAGNOSIS — E1169 Type 2 diabetes mellitus with other specified complication: Secondary | ICD-10-CM | POA: Diagnosis not present

## 2023-03-06 DIAGNOSIS — I10 Essential (primary) hypertension: Secondary | ICD-10-CM | POA: Diagnosis not present

## 2023-03-06 DIAGNOSIS — M4620 Osteomyelitis of vertebra, site unspecified: Secondary | ICD-10-CM | POA: Diagnosis not present

## 2023-03-06 DIAGNOSIS — E876 Hypokalemia: Secondary | ICD-10-CM | POA: Diagnosis not present

## 2023-03-06 LAB — BASIC METABOLIC PANEL
Anion gap: 8 (ref 5–15)
BUN: 9 mg/dL (ref 6–20)
CO2: 28 mmol/L (ref 22–32)
Calcium: 9.3 mg/dL (ref 8.9–10.3)
Chloride: 100 mmol/L (ref 98–111)
Creatinine, Ser: 0.74 mg/dL (ref 0.61–1.24)
GFR, Estimated: >60 mL/min (ref >60)
Glucose, Bld: 165 mg/dL — ABNORMAL HIGH (ref 70–99)
Potassium: 4.1 mmol/L (ref 3.5–5.1)
Sodium: 136 mmol/L (ref 135–145)

## 2023-03-06 MED ORDER — LIDOCAINE 5 % EX PTCH: 1.0000 | MEDICATED_PATCH | CUTANEOUS | 0 refills | Status: DC

## 2023-03-06 MED ORDER — MORPHINE SULFATE ER 15 MG PO TBCR: 30.0000 mg | EXTENDED_RELEASE_TABLET | Freq: Two times a day (BID) | ORAL | 0 refills | Status: AC

## 2023-03-06 MED ORDER — BACLOFEN 5 MG PO TABS: 10.0000 mg | ORAL_TABLET | Freq: Three times a day (TID) | ORAL | 2 refills | Status: DC

## 2023-03-06 MED ORDER — CEFEPIME IV (FOR PTA / DISCHARGE USE ONLY)
2.0000 g | Freq: Three times a day (TID) | INTRAVENOUS | 0 refills | Status: AC
Start: 2023-03-06 — End: 2023-04-24

## 2023-03-06 MED ORDER — MORPHINE SULFATE ER 30 MG PO CP24
30.0000 mg | ORAL_CAPSULE | Freq: Two times a day (BID) | ORAL | 0 refills | Status: DC
  Filled 2023-03-06: qty 38, 19d supply, fill #0

## 2023-03-06 MED ORDER — OXYCODONE-ACETAMINOPHEN 10-325 MG PO TABS: 1.0000 | ORAL_TABLET | Freq: Every day | ORAL | 0 refills | Status: AC

## 2023-03-06 MED ORDER — OXYCODONE-ACETAMINOPHEN 10-325 MG PO TABS
1.0000 | ORAL_TABLET | Freq: Every day | ORAL | 0 refills | Status: AC | PRN
  Filled 2023-03-06: qty 95, 19d supply, fill #0
  Filled 2023-03-07: qty 95, 20d supply, fill #0
  Filled 2023-03-10: qty 95, 19d supply, fill #0

## 2023-03-06 MED ORDER — MORPHINE SULFATE ER 30 MG PO CP24: 30.0000 mg | ORAL_CAPSULE | Freq: Two times a day (BID) | ORAL | 0 refills | Status: DC

## 2023-03-06 MED ORDER — HYDROMORPHONE HCL 1 MG/ML IJ SOLN: 1.0000 mg | INTRAMUSCULAR | Status: DC | PRN

## 2023-03-06 NOTE — Progress Notes (Signed)
Pt discharged home with wife. PICC line in place and received afternoon dose of abx prior to d/c. Pt educated on all discharge material and has no questions at this time.   Robina Ade, RN

## 2023-03-06 NOTE — Progress Notes (Signed)
Pt's continuous glucose monitor=93

## 2023-03-06 NOTE — Care Management Important Message (Signed)
Important Message  Patient Details  Name: John Ware MRN: [id number] Date of Birth: [birth year]   Medicare Important Message Given:  Yes     Sherilyn Banker 03/06/2023, 12:12 PM

## 2023-03-06 NOTE — Anesthesia Postprocedure Evaluation (Signed)
Anesthesia Post Note  Patient: John Ware  Procedure(s) Performed: disc aspiration     Patient location during evaluation: PACU Anesthesia Type: General Level of consciousness: sedated and patient cooperative Pain management: pain level controlled Vital Signs Assessment: post-procedure vital signs reviewed and stable Respiratory status: spontaneous breathing Cardiovascular status: stable Anesthetic complications: no   No notable events documented.  Last Vitals:  Vitals:   03/06/23 0317 03/06/23 0727  BP: 122/88 135/85  Pulse: 78 78  Resp: 14 16  Temp: 37 C 36.7 C  SpO2: 99% 100%    Last Pain:  Vitals:   03/06/23 0834  TempSrc:   PainSc: 6                  [name]

## 2023-03-06 NOTE — Discharge Summary (Signed)
Physician Discharge Summary  John Ware ZOX:[id number] DOB: 10/26/1965 DOA: 02/22/2023  PCP: John Spencer, FNP  Admit date: 02/22/2023 Discharge date: 03/06/2023  Admitted From: Home  Discharge disposition: Home  Recommendations for Outpatient Follow-Up:   Follow up with your primary care provider in one week.  Patient will need to follow-up with the pain management clinic for ongoing management of pain. ID has recommended IV cefepime on discharge 2 g 3 times daily for 8 weeks end of treatment April 23, 2023 via PICC line. Follow-up with infectious disease clinic as outpatient as scheduled on April 08, 2023.  Labs as per home health. Follow-up with neurosurgery as outpatient.  Discharge Diagnosis:   Principal Problem:   Vertebral osteomyelitis (HCC) Active Problems:   DM2 (diabetes mellitus, type 2) (HCC)   Hyperlipidemia associated with type 2 diabetes mellitus (HCC)   HTN (hypertension)   Thrombocytosis   Intractable back pain   Hypokalemia  Discharge Condition: Improved.  Diet recommendation: carbohydrate-modified.    Wound care: None.  Code status: Full.   History of Present Illness:   John Ware is a 57 y.o. male with past medical history of depression, type 2 diabetes, hypertension,  spinal stenosis with neurogenic claudication, chronic vertebral osteomyelitis who has been treated recently with cefadroxil and doxycycline, active with neurosurgery and ID follow-up was sent to the ED to get premedicated for thoracic spine MRI.  Unfortunately, patient was unable to do the scan due to extreme pain.  He had been having increasing weakness of his lower extremity.  In the ED, patient was slightly tachycardic with elevated blood pressure at 137/102.  Patient had no leukocytosis.  There was mild hypokalemia at 3.4. Patient  was considered for admission to the hospital for further evaluation.   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  During hospitalization, patient received IV Dilaudid, oxycodone, long-acting morphine, baclofen, Lidoderm patch,.  Initially was unable to lie down for MRI but MRI was performed with anesthesia on 02/25/2023 with findings consistent with discitis and osteomyelitis of the lower thoracic spine progressed since September 2023 with phlegmon with severe narrowing of T10-T11 and T11-T12 disc spaces.  Neurosurgery had an opinion that surgical intervention would not be helpful and might be equally risky compared to conservative treatment so CT-guided aspiration and culture has been done on 7/10 under anesthesia.  Aerobic and anaerobic cultures negative in less than 24 hours but pending fungal culture, AFB stain,   ID has recommended IV cefepime on discharge 2 g 3 times daily for 8 weeks end of treatment April 23, 2023 via PICC line..  Blood cultures negative.  Patient  was seen by neurosurgery and ID.  Patient does not have leukocytosis or fever at this time.     DM2 (diabetes mellitus, type 2) with hyperglycemia. Latest hemoglobin A1c was 7.7.  Continue sliding scale insulin, diabetic diet.  Controlled at this time     Hyperlipidemia associated with type 2 diabetes mellitus (HCC) Currently not on medical therapy.     HTN (hypertension) BP controlled when not in pain.     Thrombocytosis Improved   Disposition.  At this time, patient is stable for disposition home with outpatient PCP, ID and neurosurgery follow-up.  Patient will also need to follow-up with pain management as outpatient.  Medical Consultants:   Infectious disease Neurosurgery Interventional radiology  Procedures:    IR  guided disc aspiration under anesthesia on 03/05/2023. PICC line placement  Subjective:   Today, patient was seen and examined at bedside.  Inquiring about going home.  Discharge Exam:    Vitals:   03/06/23 0727 03/06/23 1131  BP: 135/85 129/87  Pulse: 78 70  Resp: 16 17  Temp: 98 F (36.7 C) 98.1 F (36.7 C)  SpO2: 100% 98%   Vitals:   03/05/23 2310 03/06/23 0317 03/06/23 0727 03/06/23 1131  BP: 115/81 122/88 135/85 129/87  Pulse: 68 78 78 70  Resp: 16 14 16 17   Temp: 98.7 F (37.1 C) 98.6 F (37 C) 98 F (36.7 C) 98.1 F (36.7 C)  TempSrc: Oral Oral Oral Oral  SpO2: 99% 99% 100% 98%  Weight:      Height:        General: Alert awake, not in obvious distress, older than stated age, HENT: pupils equally reacting to light,  No scleral pallor or icterus noted. Oral mucosa is moist.  Chest:  Clear breath sounds.  Diminished breath sounds bilaterally. No crackles or wheezes.  Tenderness of the back. CVS: S1 &S2 heard. No murmur.  Regular rate and rhythm. Abdomen: Soft, nontender, nondistended.  Bowel sounds are heard.   Extremities: No cyanosis, clubbing or edema.  Peripheral pulses are palpable.  Right upper extremity PICC line in place. Psych: Alert, awake and oriented, normal mood CNS:  No cranial nerve deficits.  Power equal in all extremities.   Skin: Warm and dry.  No rashes noted.  The results of significant diagnostics from this hospitalization (including imaging, microbiology, ancillary and laboratory) are listed below for reference.     Diagnostic Studies:   No results found.   Labs:   Basic Metabolic Panel: Recent Labs  Lab 03/06/23 0457  NA 136  K 4.1  CL 100  CO2 28  GLUCOSE 165*  BUN 9  CREATININE 0.74  CALCIUM 9.3   GFR Estimated Creatinine Clearance: 107.7 mL/min (by C-G formula based on SCr of 0.74 mg/dL). Liver Function Tests: No results for input(s): "AST", "ALT", "ALKPHOS", "BILITOT", "PROT", "ALBUMIN" in the last 168 hours. No results for input(s): "LIPASE", "AMYLASE" in the last 168 hours. No results for input(s): "AMMONIA" in the last 168 hours. Coagulation profile Recent Labs  Lab 03/05/23 0451  INR 1.1     CBC: Recent Labs  Lab 03/05/23 0451  WBC 6.9  NEUTROABS 3.7  HGB 12.4*  HCT 38.0*  MCV 83.7  PLT 426*   Cardiac Enzymes: No results for input(s): "CKTOTAL", "CKMB", "CKMBINDEX", "TROPONINI" in the last 168 hours. BNP: Invalid input(s): "POCBNP" CBG: Recent Labs  Lab 03/05/23 0757 03/05/23 1056  GLUCAP 126* 153*   D-Dimer No results for input(s): "DDIMER" in the last 72 hours. Hgb A1c No results for input(s): "HGBA1C" in the last 72 hours. Lipid Profile No results for input(s): "CHOL", "HDL", "LDLCALC", "TRIG", "CHOLHDL", "LDLDIRECT" in the last 72 hours. Thyroid function studies No results for input(s): "TSH", "T4TOTAL", "T3FREE", "THYROIDAB" in the last 72 hours.  Invalid input(s): "FREET3" Anemia work up No results for input(s): "VITAMINB12", "FOLATE", "FERRITIN", "TIBC", "IRON", "RETICCTPCT" in the last 72 hours. Microbiology Recent Results (from the past 240 hour(s))  Aerobic/Anaerobic Culture w Gram Stain (surgical/deep wound)     Status: None (Preliminary result)   Collection Time: 03/05/23 10:04 AM   Specimen: Fine Needle Aspirate  Result Value Ref Range Status   Specimen Description NEEDLE ASPIRATE  Final   Special Requests NONE  Final   Gram  Stain   Final    FEW WBC PRESENT, PREDOMINANTLY PMN NO ORGANISMS SEEN    Culture   Final    NO GROWTH < 24 HOURS Performed at [facility], [address], [place], [place] [address]    Report Status PENDING  Incomplete  Aerobic/Anaerobic Culture w Gram Stain (surgical/deep wound)     Status: None (Preliminary result)   Collection Time: 03/05/23 10:07 AM   Specimen: Fine Needle Aspirate  Result Value Ref Range Status   Specimen Description NEEDLE ASPIRATE  Final   Special Requests  IDISC  Final   Gram Stain   Final    RARE WBC PRESENT, PREDOMINANTLY PMN NO ORGANISMS SEEN    Culture   Final    NO GROWTH < 24 HOURS Performed at [facility], [address], [place], [place] [address]     Report Status PENDING  Incomplete     Discharge Instructions:   Discharge Instructions     [facility] pharmacist to adjust dose for Vancomycin, Aminoglycosides and other anti-infective therapies as requested by physician.   Complete by: As directed    [facility] to provide Cath Flo 2mg    Complete by: As directed    Administer for PICC line occlusion and as ordered by physician for other access device issues.   Anaphylaxis Kit: Provided to treat any anaphylactic reaction to the medication being provided to the patient if First Dose or when requested by physician   Complete by: As directed    Epinephrine 1mg /ml vial / amp: Administer 0.3mg  (0.23ml) subcutaneously once for moderate to severe anaphylaxis, nurse to call physician and pharmacy when reaction occurs and call 911 if needed for immediate care   Diphenhydramine 50mg /ml IV vial: Administer 25-50mg  IV/IM PRN for first dose reaction, rash, itching, mild reaction, nurse to call physician and pharmacy when reaction occurs   Sodium Chloride 0.9% NS IV: Administer if needed for hypovolemic blood pressure drop or as ordered by physician after call to physician with anaphylactic reaction   Call MD for:  severe uncontrolled pain   Complete by: As directed    Call MD for:  temperature >100.4   Complete by: As directed    Change dressing on IV access line weekly and PRN   Complete by: As directed    Diet general   Complete by: As directed    Discharge instructions   Complete by: As directed    Follow-up with your primary care provider in 1 week.  Check blood work at that time.  Follow-up with pain clinic as outpatient for adequate management of pain.  Follow-up with infectious disease clinic as scheduled with Dr. Elinor Parkinson August 13, 11;15am  Follow-up with neurosurgery as scheduled by the clinic.   Flush IV access with Sodium Chloride 0.9% and Heparin 10 units/ml or 100 units/ml   Complete by: As directed     Home infusion instructions - [facility]   Complete by: As directed    Instructions: Flush IV access with Sodium Chloride 0.9% and Heparin 10units/ml or 100units/ml   Change dressing on IV access line: Weekly and PRN   Instructions Cath Flo 2mg : Administer for PICC Line occlusion and as ordered by physician for other access device   [facility] pharmacist to adjust dose for: Vancomycin, Aminoglycosides and other anti-infective therapies as requested by physician   Increase activity slowly   Complete by: As directed    Method of administration may  be changed at the discretion of home infusion pharmacist based upon assessment of the patient and/or caregiver's ability to self-administer the medication ordered   Complete by: As directed    No wound care   Complete by: As directed       Allergies as of 03/06/2023       Reactions   Tizanidine Other (See Comments)   Leg cramps        Medication List     STOP taking these medications    cefadroxil 500 MG capsule Commonly known as: DURICEF   doxycycline 100 MG tablet Commonly known as: VIBRA-TABS   morphine 30 MG 24 hr capsule Commonly known as: KADIAN Replaced by: morphine 15 MG 12 hr tablet       TAKE these medications    acetaminophen 500 MG tablet Commonly known as: TYLENOL Take 1 tablet (500 mg total) by mouth every 6 (six) hours as needed.   Baclofen 5 MG Tabs Take 2 tablets (10 mg total) by mouth 3 (three) times daily. What changed: how much to take   ceFEPime IVPB Commonly known as: MAXIPIME Inject 2 g into the vein every 8 (eight) hours. Indication:  Vertebral osteomyelitis First Dose: Yes Last Day of Therapy:  04/23/2023 Labs - Once weekly:  CBC/D and BMP, Labs - Once weekly: ESR and CRP Method of administration: IV Push Method of administration may be changed at the discretion of home infusion pharmacist based upon assessment of the patient and/or caregiver's ability to self-administer  the medication ordered.   FreeStyle Libre 2 Sensor Misc CHECK BLOOD SUGAR 4 TIMES A DAY   gabapentin 400 MG capsule Commonly known as: NEURONTIN Take 1 capsule (400 mg total) by mouth 2 (two) times daily.   hydrOXYzine 10 MG tablet Commonly known as: ATARAX Take 1 tablet (10 mg total) by mouth 3 (three) times daily.   lidocaine 5 % Commonly known as: LIDODERM Place 1 patch onto the skin daily. Remove & Discard patch within 12 hours or as directed by MD   morphine 15 MG 12 hr tablet Commonly known as: MS CONTIN Take 2 tablets (30 mg total) by mouth every 12 (twelve) hours for 5 days. Replaces: morphine 30 MG 24 hr capsule   naloxone 0.4 MG/ML injection Commonly known as: NARCAN Inject 1 mL (0.4 mg total) into the vein as needed.   oxyCODONE-acetaminophen 10-325 MG tablet Commonly known as: PERCOCET Take 1 tablet by mouth 5 times daily.               Discharge Care Instructions  (From admission, onward)           Start     Ordered   03/06/23 0000  Change dressing on IV access line weekly and PRN  (Home infusion instructions - [facility] )        03/06/23 0738            Follow-up Information     Jannifer Rodney A, FNP Follow up in 1 week(s).   Specialty: Family Medicine Contact information: [address] [place] [address] [phone]         [name], MD Follow up in 2 week(s).   Specialty: Neurosurgery Why: spine followup Contact information: [address] [place] [address] [phone]         Odette Fraction, MD. Go to.   Specialty: Infectious Diseases Why: August 13, 11;15am Contact information: [address] [place]  [address] [phone]         Management, [facility] Follow up.   Specialty: Pain Medicine Why: Someone will contact you regarding plan to re-schedule appointment Contact information: [address] [place]  [address] [phone]         [facility] ([facility]) Follow up.   Why: home IV abx arranged- they will deliver home abx later today Contact information: [phone]        Care, [facility] Follow up.   Specialty: Home Health Services Why: HHRN arranged for home IV abx needs- PICC line care and Lab draws- Contact information: [address] [place] [address] [phone]                  Time coordinating discharge: 39 minutes  Signed:  Naimah Yingst  [facility] 03/06/2023, 2:17 PM

## 2023-03-06 NOTE — TOC Transition Note (Signed)
Transition of Care (TOC) - CM/SW Discharge Note Donn Pierini RN,BSN Transitions of Care Unit 4NP (Non Trauma)- RN Case Manager See Treatment Team for direct Phone #   Patient Details  Name: John Ware MRN: [id number] Date of Birth: Jul 20, 1966  Transition of Care [facility]) CM/SW Contact:  Darrold Span, RN Phone Number: 03/06/2023, 12:35 PM   Clinical Narrative:    Notified pt stable for transition home today, PICC line has been placed and pt will need LT IV abx- OPAT has been placed and signed.  Per MD pt needs to get to outpt Pain management appointment this am.   CM has spoken with pt and he has used [facility] and [facility] in the past- pt voiced he is agreeable to use them again or whoever- he states he just wants to get home today- his pain management appointment is for 11am- and he voices that he is not going to make it and has already spoken with them and will not be able to be seen again until after 7/27. Pt is very concerned about what he is going to do about his pain meds. Pt also frustrated about a bill that he got regarding his last home iv abx- he is asking who he needs to speak with to "take care of this"- and showed this Clinical research associate the letter he had gotten.  CM will reach out to [facility] liaison regarding letter as letter states they were sent original regarding coverage and payment.   CM has been in touch with [facility] liaison- Pam this am regarding home abx needs- Pam plans to come to bedside to provide education refresher as pt is familiar with home infusion. Due to short notice this am of transition home- pt's home abx will be delivered later today. (Pt will need to stay and get next dose prior to leaving).  Pam has spoken with pt and wife regarding home abx plans and coverage  1130- CM has called Pain Management Clinic- [facility] [phone]) and per conversation they have spoken with pt this am- pt's provider is out of office next week and  no opening the next- office is trying to figure out something and told the pt that the nurse would call him back once they spoke with the provider about what to do.  Cm has spoken with attending who has agreed to give pt a 5 day script for pain meds in the interim.   1200- received confirmation from [facility] regarding pt's insurance coverage- per her teams check- pt's [place]ia [employer] Medicare shows as primary at this time- Call made to [facility] w/ Frances Furbish for [facility] referral- referral has been accepted for [facility] needs to coordinate with home infusion for abx and PICC line care.  Kandee Keen will contact Pam to coordinate needs. [facility] to call pt to schedule.      Final next level of care: Home w Home Health Services Barriers to Discharge: Barriers Resolved   Patient Goals and CMS Choice CMS Medicare.gov Compare Post Acute Care list provided to:: Patient Choice offered to / list presented to : Patient  Discharge Placement               Home w/ [facility]          Discharge Plan and Services Additional resources added to the After Visit Summary for     Discharge Planning Services: CM Consult Post Acute Care Choice: Home Health          DME Arranged: N/A DME Agency:  NA       HH Arranged: RN, IV Antibiotics HH Agency: [facility], [facility] Date [facility] Agency Contacted: 03/06/23 Time HH Agency Contacted: 1222 Representative spoke with at [facility] Agency: Cory/Pam  Social Determinants of Health (SDOH) Interventions SDOH Screenings   Food Insecurity: Food Insecurity Present (12/23/2022)  Housing: Low Risk  (12/23/2022)  Transportation Needs: No Transportation Needs (12/23/2022)  Utilities: Not At Risk (12/03/2022)  Alcohol Screen: Low Risk  (12/23/2022)  Depression (PHQ2-9): Low Risk  (01/30/2023)  Financial Resource Strain: Low Risk  (12/23/2022)  Physical Activity: Unknown (12/23/2022)  Social Connections: Moderately Isolated (12/23/2022)  Stress: No Stress Concern Present (12/23/2022)  Tobacco Use: Medium Risk  (03/05/2023)  Health Literacy: Low Risk  (09/12/2020)   Received from [facility]     Readmission Risk Interventions    03/06/2023   12:35 PM 02/16/2021   12:11 PM  Readmission Risk Prevention Plan  Transportation Screening Complete Complete  PCP or Specialist Appt within 3-5 Days  Complete  Home Care Screening Complete   Medication Review (RN CM) Complete   HRI or Home Care Consult  Complete  Social Work Consult for Recovery Care Planning/Counseling  Complete  Palliative Care Screening  Complete  Medication Review Oceanographer)  Complete

## 2023-03-07 ENCOUNTER — Other Ambulatory Visit (HOSPITAL_COMMUNITY): Payer: Self-pay

## 2023-03-07 ENCOUNTER — Other Ambulatory Visit: Payer: Self-pay | Admitting: Family

## 2023-03-07 LAB — ACID FAST SMEAR (AFB, MYCOBACTERIA)
Acid Fast Smear: NEGATIVE
Acid Fast Smear: NEGATIVE

## 2023-03-10 ENCOUNTER — Telehealth: Payer: Self-pay

## 2023-03-10 ENCOUNTER — Other Ambulatory Visit: Payer: Self-pay

## 2023-03-10 ENCOUNTER — Telehealth: Payer: Self-pay | Admitting: Family

## 2023-03-10 ENCOUNTER — Other Ambulatory Visit (HOSPITAL_COMMUNITY): Payer: Self-pay

## 2023-03-10 LAB — AEROBIC/ANAEROBIC CULTURE W GRAM STAIN (SURGICAL/DEEP WOUND)
Culture: NO GROWTH
Culture: NO GROWTH

## 2023-03-10 LAB — FUNGUS CULTURE WITH STAIN

## 2023-03-10 LAB — FUNGUS CULTURE RESULT

## 2023-03-10 NOTE — Transitions of Care (Post Inpatient/ED Visit) (Signed)
03/10/2023  Name: John Ware MRN: [id number] DOB: 08-Aug-1966  Today's TOC FU Call Status: Today's TOC FU Call Status:: Successful TOC FU Call Competed TOC FU Call Complete Date: 03/10/23  Transition Care Management Follow-up Telephone Call Date of Discharge: 03/06/23 Discharge Facility: [facility]) Type of Discharge: Inpatient Admission Primary Inpatient Discharge Diagnosis:: discitis How have you been since you were released from the hospital?: Better Any questions or concerns?: No  Items Reviewed: Did you receive and understand the discharge instructions provided?: Yes Medications obtained,verified, and reconciled?: Yes (Medications Reviewed) Any new allergies since your discharge?: No Dietary orders reviewed?: Yes Do you have support at home?: Yes People in Home: spouse  Medications Reviewed Today: Medications Reviewed Today     Reviewed by Karena Addison, LPN (Licensed Practical Nurse) on 03/10/23 at 1615  Med List Status: <None>   Medication Order Taking? Sig Documenting Provider Last Dose Status Informant  acetaminophen (TYLENOL) 500 MG tablet [id number] No Take 1 tablet (500 mg total) by mouth every 6 (six) hours as needed. Jacquelynn Cree, PA-C unk Active Self, Pharmacy Records  Baclofen 5 MG TABS [id number]  Take 2 tablets (10 mg total) by mouth 3 (three) times daily. Pokhrel, Laxman, MD  Active   ceFEPime (MAXIPIME) IVPB [id number]  Inject 2 g into the vein every 8 (eight) hours. Indication:  Vertebral osteomyelitis First Dose: Yes Last Day of Therapy:  04/23/2023 Labs - Once weekly:  CBC/D and BMP, Labs - Once weekly: ESR and CRP Method of administration: IV Push Method of administration may be changed at the discretion of home infusion pharmacist based upon assessment of the patient and/or caregiver's ability to self-administer the medication ordered. Pokhrel, Rebekah Chesterfield, MD  Active   Continuous Blood Gluc Sensor (FREESTYLE LIBRE 2 SENSOR) [place] [id number] No  CHECK BLOOD SUGAR 4 TIMES A DAY Hawks, Christy A, FNP Taking Active Self, Pharmacy Records  gabapentin (NEURONTIN) 400 MG capsule [id number] No Take 1 capsule (400 mg total) by mouth 2 (two) times daily. Junie Spencer, FNP Past Week Active Self, Pharmacy Records  lidocaine (LIDODERM) 5 % [id number]  Place 1 patch onto the skin daily. Remove & Discard patch within 12 hours or as directed by MD Joycelyn Das, MD  Active   morphine (KADIAN) 30 MG 24 hr capsule [id number]  Take 1 capsule (30 mg) by mouth every 12 hours.   Active   morphine (MS CONTIN) 15 MG 12 hr tablet [id number]  Take 2 tablets (30 mg total) by mouth every 12 (twelve) hours for 5 days. Pokhrel, Rebekah Chesterfield, MD  Active   naloxone [facility]) 0.4 MG/ML injection [id number] No Inject 1 mL (0.4 mg total) into the vein as needed. Love, Evlyn Kanner, PA-C unk Active Self, Pharmacy Records  oxyCODONE-acetaminophen (PERCOCET) 10-325 MG tablet [id number]  Take 1 tablet by mouth 5 times daily. Pokhrel, Laxman, MD  Active   oxyCODONE-acetaminophen (PERCOCET) 10-325 MG tablet [id number]  Take 1 tablet by mouth 5 times daily as needed.   Active   Med List Note Gwenlyn Fudge, [place] 11/23/19 1706): Pain management with [facility].             Home Care and Equipment/Supplies: Were Home Health Services Ordered?: NA Any new equipment or medical supplies ordered?: NA  Functional Questionnaire: Do you need assistance with bathing/showering or dressing?: No Do you need assistance with meal preparation?: No Do you need assistance with eating?: No Do you have difficulty maintaining continence: No Do you need assistance with getting  out of bed/getting out of a chair/moving?: No Do you have difficulty managing or taking your medications?: No  Follow up appointments reviewed: PCP Follow-up appointment confirmed?: Yes Date of PCP follow-up appointment?: 03/11/23 Follow-up Provider: [facility] Follow-up appointment confirmed?: Yes Date  of Specialist follow-up appointment?: 04/08/23 Follow-Up Specialty Provider:: ID Do you need transportation to your follow-up appointment?: No Do you understand care options if your condition(s) worsen?: Yes-patient verbalized understanding    SIGNATURE Karena Addison, LPN [facility] Nurse Health Advisor Direct Dial [phone]

## 2023-03-11 ENCOUNTER — Encounter: Payer: Self-pay | Admitting: Family

## 2023-03-11 ENCOUNTER — Ambulatory Visit (INDEPENDENT_AMBULATORY_CARE_PROVIDER_SITE_OTHER): Payer: Medicare HMO | Admitting: Family

## 2023-03-11 ENCOUNTER — Other Ambulatory Visit (HOSPITAL_COMMUNITY): Payer: Self-pay

## 2023-03-11 VITALS — BP 136/89 | HR 67 | Temp 98.1°F | Ht 70.0 in | Wt 184.0 lb

## 2023-03-11 DIAGNOSIS — M4625 Osteomyelitis of vertebra, thoracolumbar region: Secondary | ICD-10-CM

## 2023-03-11 DIAGNOSIS — M4624 Osteomyelitis of vertebra, thoracic region: Secondary | ICD-10-CM | POA: Diagnosis not present

## 2023-03-11 DIAGNOSIS — E1142 Type 2 diabetes mellitus with diabetic polyneuropathy: Secondary | ICD-10-CM

## 2023-03-11 DIAGNOSIS — M48062 Spinal stenosis, lumbar region with neurogenic claudication: Secondary | ICD-10-CM

## 2023-03-11 DIAGNOSIS — I10 Essential (primary) hypertension: Secondary | ICD-10-CM

## 2023-03-11 DIAGNOSIS — E1169 Type 2 diabetes mellitus with other specified complication: Secondary | ICD-10-CM | POA: Diagnosis not present

## 2023-03-11 MED ORDER — GRALISE 450 MG PO TABS
450.0000 mg | ORAL_TABLET | Freq: Every day | ORAL | 1 refills | Status: DC
Start: 2023-03-11 — End: 2023-03-13
  Filled 2023-03-11: qty 90, fill #0

## 2023-03-11 NOTE — Patient Instructions (Signed)
Chronic Pain, Adult Chronic pain is a type of pain that lasts or keeps coming back for at least 3-6 months. You may have headaches, pain in the abdomen, or pain in other areas of the body. Chronic pain may be related to an illness, injury, or a health condition. Sometimes, the cause of chronic pain is not known. Chronic pain can make it hard for you to do daily activities. If it is not treated, chronic pain can lead to anxiety and depression. Treatment depends on the cause of your pain and how severe it is. You may need to work with a pain specialist to come up with a treatment plan. Many people benefit from two or more types of treatment to control their pain. Follow these instructions at home: Treatment plan Follow your treatment plan as told by your health care provider. This may include: Gentle, regular exercise. Eating a healthy diet that includes foods such as vegetables, fruits, fish, and lean meats. Mental health therapy (cognitive or behavioral therapy) that changes the way you think or act in response to the pain. This may help improve how you feel. Doing physical therapy exercises to improve movement and strength. Meditation, yoga, acupuncture, or massage therapy. Using the oils from plants in your environment or on your skin (aromatherapy). Other treatments may include: Over-the-counter or prescription medicines. Color, light, or sound therapy. Local electrical stimulation. The electrical pulses help to relieve pain by temporarily stopping the nerve impulses that cause you to feel pain. Injections. These deliver numbing or pain-relieving medicines into the spine or the area of pain.  Medicines Take over-the-counter and prescription medicines only as told by your health care provider. Ask your health care provider if the medicine prescribed to you: Requires you to avoid driving or using machinery. Can cause constipation. You may need to take these actions to prevent or treat  constipation: Drink enough fluid to keep your urine pale yellow. Take over-the-counter or prescription medicines. Eat foods that are high in fiber, such as beans, whole grains, and fresh fruits and vegetables. Limit foods that are high in fat and processed sugars, such as fried or sweet foods. Lifestyle  Ask your health care provider whether you should keep a pain diary. Your health care provider will tell you what information to write in the diary. This may include: When you have pain. What the pain feels like. How medicines and other behaviors or treatments help to reduce the pain. Consider talking with a mental health care provider about how to help manage chronic pain. Consider joining a chronic pain support group. Try to control or lower your stress levels. Talk with your health care provider about ways to do this. General instructions Learn as much as you can about how to manage your chronic pain. Ask your health care provider if an intensive pain rehabilitation program or a chronic pain specialist would be helpful. Check your pain level as told by your health care provider. Ask your health care provider if you should use a pain scale. Contact a health care provider if: Your pain is not controlled with treatment. You have new pain. You have side effects from pain medicine. You feel weak or you have trouble doing your normal activities. You have trouble sleeping or you develop confusion. You lose feeling or have numbness in your body. You lose control of your bowels or bladder. Get help right away if: Your pain suddenly gets much worse. You develop chest pain. You have trouble breathing or shortness of  breath. You faint, or another person sees you faint. These symptoms may be an emergency. Get help right away. Call 911. Do not wait to see if the symptoms will go away. Do not drive yourself to the hospital. Also, get help right away if: You have thoughts about hurting yourself  or others. Take one of these steps if you feel like you may hurt yourself or others, or have thoughts about taking your own life: Go to your nearest emergency room. Call 911. Call the National Suicide Prevention Lifeline at 613-055-8912 or 988. This is open 24 hours a day. Text the Crisis Text Line at 323-193-1954. This information is not intended to replace advice given to you by your health care provider. Make sure you discuss any questions you have with your health care provider. Document Revised: 04/03/2022 Document Reviewed: 03/06/2022 Elsevier Patient Education  2024 [employer].

## 2023-03-11 NOTE — Telephone Encounter (Signed)
Discussed at visit in office.   Jannifer Rodney, FNP

## 2023-03-11 NOTE — Progress Notes (Signed)
Subjective:    Patient ID: John Ware, male    DOB: November 18, 1965, 57 y.o.   MRN: [id number]  Chief Complaint  Patient presents with   Medical Management of Chronic Issues   Transitions Of Care    Today's visit was for Transitional Care Management.  The patient was discharged from [facility] on 03/06/23 with a primary diagnosis of vertebral osteomyelitis.   Contact with the patient and/or caregiver, by a clinical staff member, was made on 03/10/23 and was documented as a telephone encounter within the EMR.  Through chart review and discussion with the patient I have determined that management of their condition is of high complexity.   He is followed by ID and has follow up appointment 04/08/23.  Followed by Neurosurgeon and followed by Pain Clinic every month.   He has PICC line and cefepime every 8 hours. Has home health once a week.  Hypertension This is a chronic problem. The current episode started more than 1 year ago. The problem has been waxing and waning since onset. The problem is uncontrolled. Associated symptoms include malaise/fatigue and peripheral edema. Pertinent negatives include no shortness of breath. Risk factors for coronary artery disease include dyslipidemia, diabetes mellitus and sedentary lifestyle. The current treatment provides moderate improvement.  Diabetes He presents for his follow-up diabetic visit. He has type 2 diabetes mellitus. Associated symptoms include foot paresthesias. Diabetic complications include peripheral neuropathy. Risk factors for coronary artery disease include dyslipidemia, diabetes mellitus, hypertension, male sex and sedentary lifestyle. He is following a generally healthy diet. His overall blood glucose range is 110-130 mg/dl.  Back Pain This is a chronic problem. The current episode started more than 1 year ago. The problem occurs intermittently. The problem has been waxing and waning since onset. The pain is present in the  thoracic spine and lumbar spine. The quality of the pain is described as aching. The pain is at a severity of 10/10. The pain is moderate. He has tried analgesics for the symptoms. The treatment provided mild relief.      Review of Systems  Constitutional:  Positive for malaise/fatigue.  Respiratory:  Negative for shortness of breath.   Musculoskeletal:  Positive for back pain.  All other systems reviewed and are negative.  Family History  Problem Relation Age of Onset   Cancer Mother    Colon cancer Neg Hx    Social History   Socioeconomic History   Marital status: Married    Spouse name: Not on file   Number of children: Not on file   Years of education: Not on file   Highest education level: Some college, no degree  Occupational History   Not on file  Tobacco Use   Smoking status: Former    Current packs/day: 0.00    Average packs/day: 2.0 packs/day for 15.0 years (30.0 ttl pk-yrs)    Types: Cigarettes    Start date: 43    Quit date: 2012    Years since quitting: 12.5    Passive exposure: Never   Smokeless tobacco: Never  Vaping Use   Vaping status: Never Used  Substance and Sexual Activity   Alcohol use: Yes    Comment: occasional   Drug use: No   Sexual activity: Not Currently  Other Topics Concern   Not on file  Social History Narrative   Not on file   Social Determinants of Health   Financial Resource Strain: Low Risk  (12/23/2022)   Overall Financial Resource Strain (CARDIA)  Difficulty of Paying Living Expenses: Not very hard  Food Insecurity: Food Insecurity Present (12/23/2022)   Hunger Vital Sign    Worried About Running Out of Food in the Last Year: Sometimes true    Ran Out of Food in the Last Year: Sometimes true  Transportation Needs: No Transportation Needs (12/23/2022)   PRAPARE - Administrator, Civil Service (Medical): No    Lack of Transportation (Non-Medical): No  Physical Activity: Unknown (12/23/2022)   Exercise Vital  Sign    Days of Exercise per Week: 0 days    Minutes of Exercise per Session: Not on file  Stress: No Stress Concern Present (12/23/2022)   [employer] of Occupational Health - Occupational Stress Questionnaire    Feeling of Stress : Not at all  Social Connections: Moderately Isolated (12/23/2022)   Social Connection and Isolation Panel [NHANES]    Frequency of Communication with Friends and Family: More than three times a week    Frequency of Social Gatherings with Friends and Family: Once a week    Attends Religious Services: Never    Database administrator or Organizations: No    Attends Engineer, structural: Not on file    Marital Status: Married       Objective:   Physical Exam Vitals reviewed.  Constitutional:      General: He is not in acute distress.    Appearance: He is well-developed.  HENT:     Head: Normocephalic.     Right Ear: Tympanic membrane normal.     Left Ear: Tympanic membrane normal.  Eyes:     General:        Right eye: No discharge.        Left eye: No discharge.     Pupils: Pupils are equal, round, and reactive to light.  Neck:     Thyroid: No thyromegaly.  Cardiovascular:     Rate and Rhythm: Normal rate and regular rhythm.     Heart sounds: Normal heart sounds. No murmur heard. Pulmonary:     Effort: Pulmonary effort is normal. No respiratory distress.     Breath sounds: Normal breath sounds. No wheezing.  Abdominal:     General: Bowel sounds are normal. There is no distension.     Palpations: Abdomen is soft.     Tenderness: There is no abdominal tenderness.  Musculoskeletal:        General: Tenderness present.     Cervical back: Normal range of motion and neck supple.     Right lower leg: Edema (2+) present.     Left lower leg: Edema (2+) present.     Comments: Pain in thoracic with flexion and extension    Skin:    General: Skin is warm and dry.     Coloration: Skin is pale.     Findings: No erythema or rash.   Neurological:     Mental Status: He is alert and oriented to person, place, and time.     Cranial Nerves: No cranial nerve deficit.     Motor: Weakness present.     Gait: Gait abnormal.     Deep Tendon Reflexes: Reflexes are normal and symmetric.  Psychiatric:        Behavior: Behavior normal.        Thought Content: Thought content normal.        Judgment: Judgment normal.      BP 136/89   Pulse 67   Temp 98.1 F (  36.7 C) (Temporal)   Ht 5\' 10"  (1.778 m)   Wt 184 lb (83.5 kg)   SpO2 99%   BMI 26.40 kg/m       Assessment & Plan:   John Ware comes in today with chief complaint of Medical Management of Chronic Issues and Transitions Of Care   Diagnosis and orders addressed:  1. Primary hypertension - CMP14+EGFR - CBC with Differential/Platelet  2. Type 2 diabetes mellitus with other specified complication, without long-term current use of insulin (HCC) - CMP14+EGFR - CBC with Differential/Platelet  3. Diabetic peripheral neuropathy (HCC)  - CMP14+EGFR - CBC with Differential/Platelet - Gabapentin, Once-Daily, (GRALISE) 450 MG TABS; Take 450 mg by mouth at bedtime.  Dispense: 90 tablet; Refill: 1  4. Lumbar stenosis with neurogenic claudication  - CMP14+EGFR - CBC with Differential/Platelet - Gabapentin, Once-Daily, (GRALISE) 450 MG TABS; Take 450 mg by mouth at bedtime.  Dispense: 90 tablet; Refill: 1  5. Osteomyelitis of thoracic region (HCC) - CMP14+EGFR - CBC with Differential/Platelet - Gabapentin, Once-Daily, (GRALISE) 450 MG TABS; Take 450 mg by mouth at bedtime.  Dispense: 90 tablet; Refill: 1  6. Osteomyelitis of vertebra of thoracolumbar region (HCC) - CMP14+EGFR - CBC with Differential/Platelet - Gabapentin, Once-Daily, (GRALISE) 450 MG TABS; Take 450 mg by mouth at bedtime.  Dispense: 90 tablet; Refill: 1   Labs pending Continue medications  Refilled gabapentin XR to see if can get covered, if not will change to Gabapentin 400 mg BID   Health Maintenance reviewed Diet and exercise encouraged  Follow up plan: 3 months    Jannifer Rodney, FNP

## 2023-03-13 ENCOUNTER — Telehealth: Payer: Self-pay | Admitting: Family

## 2023-03-13 ENCOUNTER — Other Ambulatory Visit (HOSPITAL_COMMUNITY): Payer: Self-pay

## 2023-03-13 DIAGNOSIS — M4624 Osteomyelitis of vertebra, thoracic region: Secondary | ICD-10-CM

## 2023-03-13 DIAGNOSIS — E1142 Type 2 diabetes mellitus with diabetic polyneuropathy: Secondary | ICD-10-CM

## 2023-03-13 DIAGNOSIS — M48062 Spinal stenosis, lumbar region with neurogenic claudication: Secondary | ICD-10-CM

## 2023-03-13 DIAGNOSIS — M4625 Osteomyelitis of vertebra, thoracolumbar region: Secondary | ICD-10-CM

## 2023-03-13 MED ORDER — GRALISE 450 MG PO TABS
1.0000 | ORAL_TABLET | Freq: Every day | ORAL | 1 refills | Status: DC
Start: 2023-03-13 — End: 2023-04-08
  Filled 2023-03-13: qty 30, 30d supply, fill #0

## 2023-03-13 NOTE — Telephone Encounter (Signed)
Re-sent to corrected pharmacy

## 2023-03-14 ENCOUNTER — Other Ambulatory Visit (HOSPITAL_COMMUNITY): Payer: Self-pay

## 2023-03-25 ENCOUNTER — Other Ambulatory Visit (HOSPITAL_COMMUNITY): Payer: Self-pay

## 2023-03-25 MED ORDER — GABAPENTIN (ONCE-DAILY) 300 MG PO TABS
300.0000 mg | ORAL_TABLET | Freq: Every day | ORAL | 2 refills | Status: DC
  Filled 2023-03-25: qty 30, 30d supply, fill #0
  Filled 2023-05-01 – 2023-08-19 (×3): qty 30, 30d supply, fill #1

## 2023-03-25 MED ORDER — MORPHINE SULFATE ER 30 MG PO CP24
30.0000 mg | ORAL_CAPSULE | Freq: Two times a day (BID) | ORAL | 0 refills | Status: DC
Start: 2023-03-25 — End: 2023-04-22
  Filled 2023-03-25: qty 60, 30d supply, fill #0

## 2023-03-25 MED ORDER — OXYCODONE-ACETAMINOPHEN 10-325 MG PO TABS
ORAL_TABLET | ORAL | 0 refills | Status: AC
  Filled 2023-03-25 – 2023-03-28 (×2): qty 150, 30d supply, fill #0

## 2023-03-26 ENCOUNTER — Other Ambulatory Visit: Payer: Self-pay

## 2023-03-26 ENCOUNTER — Other Ambulatory Visit (HOSPITAL_COMMUNITY): Payer: Self-pay

## 2023-03-26 MED ORDER — MORPHINE SULFATE ER 30 MG PO TBCR
30.0000 mg | EXTENDED_RELEASE_TABLET | Freq: Two times a day (BID) | ORAL | 0 refills | Status: DC
  Filled 2023-03-26: qty 60, 30d supply, fill #0

## 2023-03-27 ENCOUNTER — Other Ambulatory Visit (HOSPITAL_COMMUNITY): Payer: Self-pay

## 2023-03-28 ENCOUNTER — Other Ambulatory Visit (HOSPITAL_COMMUNITY): Payer: Self-pay

## 2023-03-31 ENCOUNTER — Other Ambulatory Visit (HOSPITAL_COMMUNITY): Payer: Self-pay

## 2023-04-07 ENCOUNTER — Encounter: Payer: Self-pay | Admitting: Family

## 2023-04-07 ENCOUNTER — Ambulatory Visit (INDEPENDENT_AMBULATORY_CARE_PROVIDER_SITE_OTHER): Payer: Medicare HMO | Admitting: Family

## 2023-04-07 VITALS — BP 135/82 | HR 78 | Temp 97.2°F | Ht 70.0 in | Wt 184.0 lb

## 2023-04-07 DIAGNOSIS — R531 Weakness: Secondary | ICD-10-CM

## 2023-04-07 DIAGNOSIS — E1142 Type 2 diabetes mellitus with diabetic polyneuropathy: Secondary | ICD-10-CM | POA: Diagnosis not present

## 2023-04-07 DIAGNOSIS — R5381 Other malaise: Secondary | ICD-10-CM

## 2023-04-07 DIAGNOSIS — M51379 Other intervertebral disc degeneration, lumbosacral region without mention of lumbar back pain or lower extremity pain: Secondary | ICD-10-CM

## 2023-04-07 DIAGNOSIS — Z9181 History of falling: Secondary | ICD-10-CM

## 2023-04-07 DIAGNOSIS — M5137 Other intervertebral disc degeneration, lumbosacral region: Secondary | ICD-10-CM

## 2023-04-07 DIAGNOSIS — M25512 Pain in left shoulder: Secondary | ICD-10-CM

## 2023-04-07 DIAGNOSIS — M4626 Osteomyelitis of vertebra, lumbar region: Secondary | ICD-10-CM

## 2023-04-07 DIAGNOSIS — G8929 Other chronic pain: Secondary | ICD-10-CM

## 2023-04-07 DIAGNOSIS — M150 Primary generalized (osteo)arthritis: Secondary | ICD-10-CM

## 2023-04-07 DIAGNOSIS — M159 Polyosteoarthritis, unspecified: Secondary | ICD-10-CM

## 2023-04-07 DIAGNOSIS — M25511 Pain in right shoulder: Secondary | ICD-10-CM

## 2023-04-07 DIAGNOSIS — M4624 Osteomyelitis of vertebra, thoracic region: Secondary | ICD-10-CM

## 2023-04-07 NOTE — Progress Notes (Signed)
Subjective:    Patient ID: John Ware, male    DOB: [birth year], 57 y.o.   MRN: [id number]  Chief Complaint  Patient presents with   Wheelchair    PT presents to the office today for power wheelchair. He currently has lower leg weakness and decrease balance. He has bilateral shoulder pain and unable to push himself. His wife currently pushes him around the home. However, she works full time. His weakness and changes in gait make it difficult to grooming, bathing, toileting, and moving room to room. He is unable to stand for longer 5 seconds because of back pain.   He reports he has fallen 3 falls in the last year because of balance issues.   He has the physical and mental abilities to operate a power wheelchair safely in the home. He is willing and motivated to the use the power wheelchair.   He has been hospitalized 6 times over the last year for chronic osteomyelitis.  He current has a PICC line and getting IV cefepime every 8 hours.   He currently in wheelchair, but wife pushes him.   Back Pain This is a chronic problem. The current episode started more than 1 year ago. The problem occurs intermittently. The problem has been waxing and waning since onset. The pain is present in the lumbar spine. The quality of the pain is described as aching. The pain is at a severity of 4/10. The pain is moderate.  Arthritis Presents for follow-up visit. Affected locations include the right shoulder, left shoulder, right knee, left knee, left MCP and right MCP. His pain is at a severity of 6/10.      Review of Systems  Musculoskeletal:  Positive for arthritis and back pain.  All other systems reviewed and are negative.      Objective:   Physical Exam Vitals reviewed.  Constitutional:      General: He is not in acute distress.    Appearance: He is well-developed.  HENT:     Head: Normocephalic.     Right Ear: Tympanic membrane normal.     Left Ear: Tympanic membrane normal.  Eyes:      General:        Right eye: No discharge.        Left eye: No discharge.     Pupils: Pupils are equal, round, and reactive to light.  Neck:     Thyroid: No thyromegaly.  Cardiovascular:     Rate and Rhythm: Normal rate and regular rhythm.     Heart sounds: Normal heart sounds. No murmur heard. Pulmonary:     Effort: Pulmonary effort is normal. No respiratory distress.     Breath sounds: Normal breath sounds. No wheezing.  Abdominal:     General: Bowel sounds are normal. There is no distension.     Palpations: Abdomen is soft.     Tenderness: There is no abdominal tenderness.  Musculoskeletal:        General: No tenderness.     Cervical back: Normal range of motion and neck supple.     Comments: Bilateral pain in shoulder with abduction. Weakness bilateral LE 2/5 and UE 2/5  Skin:    General: Skin is warm and dry.     Findings: No erythema or rash.  Neurological:     Mental Status: He is alert and oriented to person, place, and time.     Cranial Nerves: No cranial nerve deficit.     Motor: Weakness present.  Coordination: Coordination abnormal.     Gait: Gait abnormal.     Deep Tendon Reflexes: Reflexes are normal and symmetric.  Psychiatric:        Behavior: Behavior normal.        Thought Content: Thought content normal.        Judgment: Judgment normal.       BP 135/82   Pulse 78   Temp (!) 97.2 F (36.2 C) (Temporal)   Ht 5\' 10"  (1.778 m)   SpO2 100%   BMI 26.40 kg/m      Assessment & Plan:  Ciaran L Radilla comes in today with chief complaint of Wheelchair    Diagnosis and orders addressed:  1. DDD (degenerative disc disease), lumbosacral - DME Wheelchair electric  2. Other chronic pain - DME Wheelchair electric  3. Diabetic polyneuropathy associated with type 2 diabetes mellitus (HCC) - DME Wheelchair electric  4. Osteomyelitis of lumbar spine (HCC) - DME Wheelchair electric  5. Osteomyelitis of thoracic region [facility]) - DME Wheelchair  electric  6. Primary osteoarthritis involving multiple joints - DME Wheelchair electric  7. Chronic pain of both shoulders - DME Wheelchair electric  8. Weakness generalized - DME Wheelchair electric  9. At risk for falling - DME Wheelchair electric  10. Physical deconditioning - DME Wheelchair electric   Continue current medications  RX for wheelchair given  Will give copy of notes  Keep follow up with specialists  Health Maintenance reviewed Diet and exercise encouraged  Follow up plan: Keep chronic follow up   Jannifer Rodney, FNP

## 2023-04-07 NOTE — Patient Instructions (Signed)
Fall Prevention in the Home, Adult Falls can cause injuries and affect people of all ages. There are many simple things that you can do to make your home safe and to help prevent falls. If you need it, ask for help making these changes. What actions can I take to prevent falls? General information Use good lighting in all rooms. Make sure to: Replace any light bulbs that burn out. Turn on lights if it is dark and use night-lights. Keep items that you use often in easy-to-reach places. Lower the shelves around your home if needed. Move furniture so that there are clear paths around it. Do not keep throw rugs or other things on the floor that can make you trip. If any of your floors are uneven, fix them. Add color or contrast paint or tape to clearly mark and help you see: Grab bars or handrails. First and last steps of staircases. Where the edge of each step is. If you use a ladder or stepladder: Make sure that it is fully opened. Do not climb a closed ladder. Make sure the sides of the ladder are locked in place. Have someone hold the ladder while you use it. Know where your pets are as you move through your home. What can I do in the bathroom?     Keep the floor dry. Clean up any water that is on the floor right away. Remove soap buildup in the bathtub or shower. Buildup makes bathtubs and showers slippery. Use non-skid mats or decals on the floor of the bathtub or shower. Attach bath mats securely with double-sided, non-slip rug tape. If you need to sit down while you are in the shower, use a non-slip stool. Install grab bars by the toilet and in the bathtub and shower. Do not use towel bars as grab bars. What can I do in the bedroom? Make sure that you have a light by your bed that is easy to reach. Do not use any sheets or blankets on your bed that hang to the floor. Have a firm bench or chair with side arms that you can use for support when you get dressed. What can I do in  the kitchen? Clean up any spills right away. If you need to reach something above you, use a sturdy step stool that has a grab bar. Keep electrical cables out of the way. Do not use floor polish or wax that makes floors slippery. What can I do with my stairs? Do not leave anything on the stairs. Make sure that you have a light switch at the top and the bottom of the stairs. Have them installed if you do not have them. Make sure that there are handrails on both sides of the stairs. Fix handrails that are broken or loose. Make sure that handrails are as long as the staircases. Install non-slip stair treads on all stairs in your home if they do not have carpet. Avoid having throw rugs at the top or bottom of stairs, or secure the rugs with carpet tape to prevent them from moving. Choose a carpet design that does not hide the edge of steps on the stairs. Make sure that carpet is firmly attached to the stairs. Fix any carpet that is loose or worn. What can I do on the outside of my home? Use bright outdoor lighting. Repair the edges of walkways and driveways and fix any cracks. Clear paths of anything that can make you trip, such as tools or rocks. Add   color or contrast paint or tape to clearly mark and help you see high doorway thresholds. Trim any bushes or trees on the main path into your home. Check that handrails are securely fastened and in good repair. Both sides of all steps should have handrails. Install guardrails along the edges of any raised decks or porches. Have leaves, snow, and ice cleared regularly. Use sand, salt, or ice melt on walkways during winter months if you live where there is ice and snow. In the garage, clean up any spills right away, including grease or oil spills. What other actions can I take? Review your medicines with your health care provider. Some medicines can make you confused or feel dizzy. This can increase your chance of falling. Wear closed-toe shoes that  fit well and support your feet. Wear shoes that have rubber soles and low heels. Use a cane, walker, scooter, or crutches that help you move around if needed. Talk with your provider about other ways that you can decrease your risk of falls. This may include seeing a physical therapist to learn to do exercises to improve movement and strength. Where to find more information Centers for Disease Control and Prevention, [employer]: cdc.gov National Institute on Aging: nia.nih.gov National Institute on Aging: nia.nih.gov Contact a health care provider if: You are afraid of falling at home. You feel weak, drowsy, or dizzy at home. You fall at home. Get help right away if you: Lose consciousness or have trouble moving after a fall. Have a fall that causes a head injury. These symptoms may be an emergency. Get help right away. Call 911. Do not wait to see if the symptoms will go away. Do not drive yourself to the hospital. This information is not intended to replace advice given to you by your health care provider. Make sure you discuss any questions you have with your health care provider. Document Revised: 04/15/2022 Document Reviewed: 04/15/2022 Elsevier Patient Education  2024 [employer]  

## 2023-04-08 ENCOUNTER — Other Ambulatory Visit (HOSPITAL_COMMUNITY): Payer: Self-pay

## 2023-04-08 ENCOUNTER — Encounter: Payer: Self-pay | Admitting: Infectious Diseases

## 2023-04-08 ENCOUNTER — Other Ambulatory Visit: Payer: Self-pay

## 2023-04-08 ENCOUNTER — Ambulatory Visit: Payer: Medicare HMO | Admitting: Infectious Diseases

## 2023-04-08 VITALS — BP 125/81 | HR 73 | Resp 16 | Ht 70.0 in | Wt 188.2 lb

## 2023-04-08 DIAGNOSIS — M4624 Osteomyelitis of vertebra, thoracic region: Secondary | ICD-10-CM | POA: Diagnosis not present

## 2023-04-08 DIAGNOSIS — Z452 Encounter for adjustment and management of vascular access device: Secondary | ICD-10-CM | POA: Diagnosis not present

## 2023-04-08 DIAGNOSIS — Z5181 Encounter for therapeutic drug level monitoring: Secondary | ICD-10-CM | POA: Diagnosis not present

## 2023-04-08 NOTE — Progress Notes (Addendum)
Patient Active Problem List   Diagnosis Date Noted   Physical deconditioning 04/07/2023   Hypokalemia 02/22/2023   Chronic back pain 01/30/2023   PICC (peripherally inserted central catheter) removal 01/30/2023   Intractable back pain 12/04/2022   Medication management 12/04/2022   Vertebral osteomyelitis (HCC) 12/04/2022   Pseudomonas aeruginosa infection 07/07/2022   Osteomyelitis of thoracic region [facility]) 07/03/2022   Osteomyelitis of lumbar spine (HCC) 05/08/2022   Midline low back pain    Numbness    Osteomyelitis (HCC)    Chronic diastolic CHF (congestive heart failure) (HCC) 04/23/2022   Chronic pain 04/23/2022   Osteomyelitis of vertebra of thoracolumbar region (HCC) 04/22/2022   Presence of retained hardware 10/17/2021   Infective myositis 08/29/2021   Discitis of thoracolumbar region 07/31/2021   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   MRSA infection    Diabetic peripheral neuropathy (HCC)    Septic discitis of lumbar region 06/11/2021   Infection of spine (HCC) 05/28/2021   Medication monitoring encounter 05/09/2021   MSSA (methicillin susceptible Staphylococcus aureus) infection 05/09/2021   History of COVID-19    Psoas abscess (HCC)    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   Right shoulder pain 02/14/2021   Normocytic anemia 02/14/2021   Thrombocytosis 02/14/2021   Endocarditis 02/05/2021   Vision changes 02/04/2021   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   Pressure injury of skin 02/01/2021   HTN (hypertension) 02/01/2021   Anxiety 02/01/2021   Left shoulder pain 02/01/2021   Hyponatremia 01/31/2021   Positive colorectal cancer screening using Cologuard test 12/28/2020   DOE (dyspnea on exertion) 10/20/2020   Diabetic polyneuropathy associated with type 2 diabetes mellitus (HCC) 10/09/2020   DM2 (diabetes mellitus, type 2) (HCC) 10/30/2018   Lumbar stenosis with neurogenic claudication 10/14/2017   DDD (degenerative disc disease), lumbar 02/17/2017    Transient weakness of right lower extremity 02/05/2017   Hyperlipidemia associated with type 2 diabetes mellitus (HCC) 10/24/2016   Depression, recurrent (HCC) 10/24/2016   Right knee pain 05/21/2016   DDD (degenerative disc disease), lumbosacral 05/21/2016   Herniation of nucleus pulposus of cervical intervertebral disc without myelopathy 02/29/2016   Carpal tunnel syndrome 02/08/2016   Cervical spondylosis with myelopathy 01/17/2016   Lumbar stenosis 12/13/2014   Current Outpatient Medications on File Prior to Visit  Medication Sig Dispense Refill   acetaminophen (TYLENOL) 500 MG tablet Take 1 tablet (500 mg total) by mouth every 6 (six) hours as needed. 30 tablet 0   Baclofen 5 MG TABS Take 2 tablets (10 mg total) by mouth 3 (three) times daily. 90 tablet 2   ceFEPime (MAXIPIME) IVPB Inject 2 g into the vein every 8 (eight) hours. Indication:  Vertebral osteomyelitis First Dose: Yes Last Day of Therapy:  04/23/2023 Labs - Once weekly:  CBC/D and BMP, Labs - Once weekly: ESR and CRP Method of administration: IV Push Method of administration may be changed at the discretion of home infusion pharmacist based upon assessment of the patient and/or caregiver's ability to self-administer the medication ordered. 49 Units 0   Continuous Blood Gluc Sensor (FREESTYLE LIBRE 2 SENSOR) MISC CHECK BLOOD SUGAR 4 TIMES A DAY 6 each 1   gabapentin (NEURONTIN) 400 MG capsule Take 1 capsule (400 mg total) by mouth 2 (two) times daily. 180 capsule 2   Gabapentin, Once-Daily, (GRALISE) 450 MG TABS Take 1 tablet by mouth at bedtime. 90 tablet 1   Gabapentin, Once-Daily, 300 MG TABS Take 1 tablet (300 mg total) by mouth daily. 30 tablet 2  lidocaine (LIDODERM) 5 % Place 1 patch onto the skin daily. Remove & Discard patch within 12 hours or as directed by MD 30 patch 0   morphine (KADIAN) 30 MG 24 hr capsule Take 1 capsule (30 mg total) by mouth every 12 (twelve) hours. 60 capsule 0   morphine (MS CONTIN) 30 MG  12 hr tablet Take 1 tablet (30 mg total) by mouth every 12 (twelve) hours. 60 tablet 0   naloxone (NARCAN) 0.4 MG/ML injection Inject 1 mL (0.4 mg total) into the vein as needed. 1 mL 0   oxyCODONE-acetaminophen (PERCOCET) 10-325 MG tablet Take 1 tablet by mouth 5 times daily as needed. 95 tablet 0   oxyCODONE-acetaminophen (PERCOCET) 10-325 MG tablet Take 1 tablet by mouth five times a day 150 tablet 0   No current facility-administered medications on file prior to visit.   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   Admitted on 04/22/2022 with inability to walk, BLE numbness, intermittent fevers and urinary incontinence x3 weeks.  He was found to have acute discitis T9/10-T11/T12 with epidural phlegmon/abscess, septic arthritis T11-T12, extensive enhancement paraspinal soft tissue, as well as soft tissue abscess. 8/30 S/p fluoroscopy guided T1-11 disc aspiration ( Cx PsA), 8/31 aspiration of left back superficial abscess ( Cx PsA). Blood cx 8/28 1/2 sets staph pseudintermedius/epidermidis. This was followed by I&D of subcut abscess with decompression and debridement of osteomyelitis via T10-T11 laminectomy on 09/07 by [name] ( OR cx NG). Seen by ID and antibiotics switched to cefepime and planned for 6 weeks course EOT 10/19 with plan to continue Ciprofloxacin  thereafter. Patient was also continued on doxycycline for hardware associated lumbar osteomyelitis. Patient was discharged to CIR due to functional decline on 9/12 and then discharged home from rehab on 9/22.   Res-started on ciprofloxacin 750mg  po bid on 11/8 due to concerns of persistent infection in the setting of elevated inflammatory markers and continued to take PO doxycycline as is without any concerns.    Admitted 4/9-4/15/24 with intractable back pain. 4/9 MRI L spine with discitis, osteomyelitis in lower T and Lumbar spine, epidural phlegmon. 5.9 x 2.0 x 2.0 cm fluid collection spanning the L4-L5 interspinous space, at the site of prior laminectomy, which could reflect a postoperative seroma, although abscess is not excluded.This is unchanged compared to 04/22/22. S/p IR aspiration 4/11,  cx grew MSSE. Discharged on cefazolin for 6 weeks. EOT 6/6 and transitioned to cefadroxil.   Admitted 6/29-7/11/24 for intractable back pain to get MRI.  MRI had to be performed with anesthesia on 7/2.  MRI findings with discitis/osteomyelitis of the lower T-spine progressed, phlegmon surrounding the thecal sac resulting in moderate narrowing of T10-T11 and severe at T11-T12 resulting in mass effect of the cord without cord signal abnormality, interval resolution of the ventral epidural fluid collection at the L1 level. Per neurosurgery surgical intervention would be high risk and recommended conservative management.  S/p CT-guided aspiration on 7/10 under anesthesia, cultures with  no growth.  Seen by ID and was discharged on IV cefepime for a total of 8 weeks, EOT 04/23/2023  04/08/23 Accompanied by wife and daughter.  He is getting IV cefepime through his right arm PICC without any concerns related to antibiotics or PICC. Reports significant improvement in back pain which is 7/10 compared to when he could hardly breathe when had back pain.  He really thinks this antibiotic is working for him.  Reports he is unable  to walk or stand without support due to imbalance although does not think his lower extremities are weak.  He is also doing upper and lower extremity exercises at home.  Review of Systems: all systems reviewed with pertinent positives and negatives as listed above   Past Medical History:  Diagnosis Date   Depression    Diabetes mellitus without complication (HCC)    Hypertension    Spinal stenosis    With Neurogenic Claudication   Past Surgical History:  Procedure Laterality Date   CARPAL TUNNEL RELEASE Bilateral    INCISION AND DRAINAGE ABSCESS Left 03/12/2021   Procedure: INCISION AND DRAINAGE ABSCESS;  Surgeon: [name], MD;  Location: [facility];  Service: Orthopedics;  Laterality: Left;   IR FLUORO GUIDED NEEDLE PLC ASPIRATION/INJECTION LOC  04/24/2022   IR FLUORO GUIDED NEEDLE PLC ASPIRATION/INJECTION LOC  12/05/2022   IR FLUORO GUIDED NEEDLE PLC ASPIRATION/INJECTION LOC  03/05/2023   IR FLUORO GUIDED NEEDLE PLC ASPIRATION/INJECTION LOC  03/05/2023   IR US GUIDE BX ASP/DRAIN  02/02/2021   IRRIGATION AND DEBRIDEMENT ABSCESS Left 02/08/2021   Procedure: MINOR INCISION AND DRAINAGE OF ABSCESS;  Surgeon: [name], MD;  Location: [facility];  Service: Orthopedics;  Laterality: Left;   IRRIGATION AND DEBRIDEMENT ABSCESS Left 02/10/2021   Procedure: IRRIGATION AND DEBRIDEMENT ABSCESS LEFT LATERAL CHEST WALL;  Surgeon: [name], MD;  Location: [facility];  Service: General;  Laterality: Left;   IRRIGATION AND DEBRIDEMENT ABSCESS Bilateral 02/20/2021   Procedure: MINOR INCISION AND DRAINAGE OF ABSCESS/WITH BILATERAL WOUND VAC PLACEMENT;  Surgeon: [name], MD;  Location: [facility];  Service: Orthopedics;  Laterality: Bilateral;   IRRIGATION AND DEBRIDEMENT SHOULDER Bilateral 02/28/2021   Procedure: IRRIGATION AND DEBRIDEMENT SHOULDER;  Surgeon: [name], MD;  Location: [facility];  Service: Orthopedics;  Laterality: Bilateral;   IRRIGATION AND DEBRIDEMENT SHOULDER Left 03/06/2021   Procedure:  IRRIGATION AND DEBRIDEMENT SHOULDER;  Surgeon: [name], MD;  Location: [facility];  Service: Orthopedics;  Laterality: Left;   IRRIGATION AND DEBRIDEMENT SHOULDER Left 03/08/2021   Procedure: IRRIGATION AND DEBRIDEMENT SHOULDER;  Surgeon: [name], MD;  Location: [facility];  Service: Orthopedics;  Laterality: Left;   LUMBAR LAMINECTOMY FOR EPIDURAL ABSCESS N/A 05/02/2022   Procedure: THORACIC  LAMINECTOMY AND DEBRIDEMENT  FOR EPIDURAL ABSCESS;  Surgeon: [name], MD;  Location: [facility];  Service: Neurosurgery;  Laterality: N/A;   LUMBAR LAMINECTOMY/DECOMPRESSION MICRODISCECTOMY N/A 12/13/2014   Procedure: LUMBAR THREE-FOUR, LUMBAR FOUR-FIVE, LUMBAR FIVE-SACRAL ONE LAMINECTOMY;  Surgeon: [name], MD;  Location: [facility];  Service: Neurosurgery;  Laterality: N/A;  L3-4 L4-5 L5-S1 Laminectomy   LUMBAR LAMINECTOMY/DECOMPRESSION MICRODISCECTOMY Left 06/05/2021   Procedure: Left lumbar one-two, Lumbar two-three Laminectomy;  Surgeon: [name], MD;  Location: [facility] OR;  Service: Neurosurgery;  Laterality: Left;   MENISCUS REPAIR     Right knee   MINOR APPLICATION OF WOUND VAC Left 02/20/2021   Procedure: MINOR APPLICATION OF KERLIX PACKING/REMOVAL OF WOUND VAC;  Surgeon: [name], MD;  Location: [facility];  Service: General;  Laterality: Left;   RADIOLOGY WITH ANESTHESIA N/A 02/25/2023   Procedure: MRI WITH ANESTHESIA OF THORACIC AND LUMBAR WITH AND WITHOUT CONTRAST;  Surgeon: Radiologist, Medication, MD;  Location: [facility];  Service: Radiology;  Laterality: N/A;   RADIOLOGY WITH ANESTHESIA N/A 03/05/2023   Procedure: disc aspiration;  Surgeon: Baldemar Lenis, MD;  Location: [facility] OR;  Service: Radiology;  Laterality: N/A;   TEE WITHOUT CARDIOVERSION N/A 02/05/2021   Procedure: TRANSESOPHAGEAL ECHOCARDIOGRAM (TEE);  Surgeon: Quintella Reichert, MD;  Location: [facility] END[facility];  Service: Cardiovascular;  Laterality: N/A;   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   TOTAL  SHOULDER ARTHROPLASTY Right 02/10/2021   Procedure: RIGHT OPEN SHOULDER I & D, OPEN DISTAL CLAVICLE RESECTION;  Surgeon: [name], MD;  Location: [facility];  Service: Orthopedics;  Laterality: Right;   VASECTOMY      Social History   Tobacco Use   Smoking status: Former    Current packs/day: 0.00    Average packs/day: 2.0 packs/day for 15.0 years (30.0 ttl pk-yrs)    Types: Cigarettes    Start date: 71    Quit date: 2012    Years since quitting: 12.6    Passive exposure: Never   Smokeless tobacco: Never  Vaping Use   Vaping status: Never Used  Substance Use Topics   Alcohol use: Yes    Comment: occasional   Drug use: No    Family History  Problem Relation Age of Onset   Cancer Mother    Colon cancer Neg Hx     Allergies  Allergen Reactions   Tizanidine Other (See Comments)    Leg cramps    Health Maintenance  Topic Date Due   Medicare Annual Wellness (AWV)  Never done   Zoster Vaccines- Shingrix (1 of 2) Never done   COVID-19 Vaccine (3 - Moderna risk series) 02/14/2020   FOOT EXAM  11/17/2022   OPHTHALMOLOGY EXAM  11/17/2022   INFLUENZA VACCINE  11/24/2023 (Originally 03/27/2023)   Diabetic kidney evaluation - Urine ACR  08/17/2023   HEMOGLOBIN A1C  08/25/2023   Fecal DNA (Cologuard)  11/03/2023   Lung Cancer Screening  12/03/2023   Diabetic kidney evaluation - eGFR measurement  03/05/2024   DTaP/Tdap/Td (2 - Td or Tdap) 08/14/2024   Hepatitis C Screening  Completed   HIV Screening  Completed   HPV VACCINES  Aged Out    Objective: BP 125/81   Pulse 73   Resp 16   Ht 5\' 10"  (1.778 m)   Wt 188 lb 3.2 oz (85.4 kg)   SpO2 98%   BMI 27.00 kg/m     Physical Exam Constitutional:  older than stated age, malnourished   HENT:     Head: Normocephalic and atraumatic.      Mouth: Mucous membranes are moist.  Eyes:    Conjunctiva/sclera: Conjunctivae normal.     Pupils:   Cardiovascular:     Rate and Rhythm: Normal rate and regular rhythm.     Heart  sounds:  Pulmonary:     Effort: Pulmonary effort is normal.     Breath sounds:   Abdominal:     General: Non distended     Palpations:   Musculoskeletal:        General: sitting in a wheel chair and needs support to stand.  Power of Rt LE 4 to 4+/5 Power of Lt LE 4 to 4+/5  Skin:    General: Skin  is warm and dry.     Comments: RT arm PICC with no concerns   Neurological:     General: grossly non focal     Mental Status: awake, alert and oriented to person, place, and time.   Psychiatric:        Mood and Affect: Mood normal.   Lab Results Lab Results  Component Value Date   WBC 6.9 03/05/2023   HGB 12.4 (L) 03/05/2023   HCT 38.0 (L) 03/05/2023   MCV 83.7 03/05/2023   PLT 426 (H) 03/05/2023    Lab Results  Component Value Date   CREATININE 0.74 03/06/2023   BUN 9 03/06/2023   NA 136 03/06/2023   K 4.1 03/06/2023   CL 100 03/06/2023   CO2 28 03/06/2023    Lab Results  Component Value Date   ALT 15 02/23/2023   AST 17 02/23/2023   ALKPHOS 85 02/23/2023   BILITOT 0.9 02/23/2023    Lab Results  Component Value Date   CHOL 146 11/08/2021   HDL 43 11/08/2021   LDLCALC 85 11/08/2021   TRIG 97 11/08/2021   CHOLHDL 3.4 11/08/2021   No results found for: "LABRPR", "RPRTITER" No results found for: "HIV1RNAQUANT", "HIV1RNAVL", "CD4TABS"   Microbiology  Results for orders placed or performed during the hospital encounter of 02/22/23  Culture, blood (Routine X 2) w Reflex to ID Panel     Status: None   Collection Time: 02/23/23  7:38 AM   Specimen: BLOOD  Result Value Ref Range Status   Specimen Description BLOOD SITE NOT SPECIFIED  Final   Special Requests   Final    BOTTLES DRAWN AEROBIC AND ANAEROBIC Blood Culture adequate volume   Culture   Final    NO GROWTH 5 DAYS Performed at [facility], [address] [place], [place] [address]    Report Status 02/28/2023 FINAL  Final  Culture, blood (Routine X 2) w Reflex to ID Panel     Status: None    Collection Time: 02/23/23  7:40 AM   Specimen: BLOOD  Result Value Ref Range Status   Specimen Description BLOOD SITE NOT SPECIFIED  Final   Special Requests   Final    BOTTLES DRAWN AEROBIC AND ANAEROBIC Blood Culture adequate volume   Culture   Final    NO GROWTH 5 DAYS Performed at [facility], [address] [place], [place] [address]    Report Status 02/28/2023 FINAL  Final  Aerobic/Anaerobic Culture w Gram Stain (surgical/deep wound)     Status: None   Collection Time: 03/05/23 10:04 AM   Specimen: Fine Needle Aspirate  Result Value Ref Range Status   Specimen Description NEEDLE ASPIRATE  Final   Special Requests NONE  Final   Gram Stain   Final    FEW WBC PRESENT, PREDOMINANTLY PMN NO ORGANISMS SEEN    Culture   Final    No growth aerobically or anaerobically. Performed at [facility], [address] [place], [place] [address]    Report Status 03/10/2023 FINAL  Final  Aerobic/Anaerobic Culture w Gram Stain (surgical/deep wound)     Status: None   Collection Time: 03/05/23 10:07 AM   Specimen: Fine Needle Aspirate  Result Value Ref Range Status   Specimen Description NEEDLE ASPIRATE  Final   Special Requests  IDISC  Final   Gram Stain   Final    RARE WBC PRESENT, PREDOMINANTLY PMN NO ORGANISMS SEEN    Culture  Final    No growth aerobically or anaerobically. Performed at [facility], [address], [place], [place] [address]    Report Status 03/10/2023 FINAL  Final  Acid Fast Smear (AFB)     Status: None   Collection Time: 03/05/23 10:09 AM   Specimen: Intervertebral Disc; Fine Needle Aspirate  Result Value Ref Range Status   AFB Specimen Processing Concentration  Final   Acid Fast Smear Negative  Final    Comment: (NOTE) Performed At: [facility] [address] [place], [place] [id number] Jolene Schimke MD AO:[id number]    Source (AFB) NEEDLE ASPIRATE  Final    Comment: Performed at [facility], [address]  [place], [place] [address]  Fungus Culture With Stain     Status: None   Collection Time: 03/05/23 10:09 AM   Specimen: Intervertebral Disc  Result Value Ref Range Status   Fungus Stain Final report  Final   Fungus (Mycology) Culture Final report  Final    Comment: (NOTE) Performed At: [facility] [address] [place], [place] [id number] Jolene Schimke MD NU:[id number]    Fungal Source NEEDLE ASPIRATE  Final    Comment: Performed at [facility], [address] [place], [place] [address]  Fungus Culture Result     Status: None   Collection Time: 03/05/23 10:09 AM  Result Value Ref Range Status   Result 1 Comment  Final    Comment: (NOTE) KOH/Calcofluor preparation:  no fungus observed. Performed At: [facility] [address] [place], [place] [id number] Jolene Schimke MD [id number]   Fungal organism reflex     Status: None   Collection Time: 03/05/23 10:09 AM  Result Value Ref Range Status   Fungal result 1 Comment  Final    Comment: (NOTE) No yeast or mold isolated after 4 weeks. Performed At: [facility] [address] [place], [place] [id number] Jolene Schimke MD UX:[id number]   Fungus Culture With Stain     Status: None   Collection Time: 03/05/23 12:25 PM  Result Value Ref Range Status   Fungus Stain Final report  Final   Fungus (Mycology) Culture Final report  Final    Comment: (NOTE) Performed At: [facility] [address] [place], [place] [id number] Jolene Schimke MD JS:[id number]    Fungal Source NEEDLE ASPIRATE  Final    Comment: Performed at [facility], [address] [place], [place] [address]  Acid Fast Smear (AFB)     Status: None   Collection Time: 03/05/23 12:25 PM   Specimen: Intervertebral Disc; Fine Needle Aspirate  Result Value Ref Range Status   AFB Specimen Processing Concentration  Final   Acid Fast Smear Negative  Final    Comment: (NOTE) Performed At: [facility] [address] [place], [place] [id number] Jolene Schimke MD OE:[id number]    Source (AFB) NEEDLE ASPIRATE  Final    Comment: Performed at [facility], [address] [place], [place] [address]  Fungus Culture Result     Status: None   Collection Time: 03/05/23 12:25 PM  Result Value Ref Range Status   Result 1 Comment  Final    Comment: (NOTE) KOH/Calcofluor preparation:  no fungus observed. Performed At: [facility] [address] [place], [place] [id number] Jolene Schimke MD YB:[id number]   Fungal organism reflex     Status: None   Collection Time: 03/05/23 12:25 PM  Result Value Ref Range Status   Fungal  result 1 Comment  Final    Comment: (NOTE) No yeast or mold isolated after 4 weeks. Performed At: [facility] [address] [place], [place] [id number] Jolene Schimke MD VQ:[id number]    Imaging MRI L spine 02/25/23 IMPRESSION: 1. Findings consistent with discitis/osteomyelitis of the lower thoracic spine, progressed since prior thoracic MRI (September 2023). The T11-12 level, which was included on prior MRI of the lumbar spine (May 2024) appear stable. 2. Phlegmon surrounding the thecal sac results in moderate narrowing at T10-11 and severe at T11-12, resulting in mass effect on the cord without cord signal abnormality. 3. Interval resolution of ventral epidural fluid collection at the L1 level. 4. Degenerative changes of the thoracic spine with moderate spinal canal stenosis at T5-6. 5. Degenerative changes of the lumbar spine with moderate bilateral neural foraminal narrowing at L1-2 and L2-3 and moderate left neural foraminal narrowing at L5-S1.   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 Will possibly switch him to an oral equivalent of cefepime after 8 weeks course ( like ciprofloxacin + cefadroxil)  as this has had significant improvement in his  back pain, normalization of inflammatory markers, which also covers his prior organisms isolated in the past ( MSSE, PsA and MSSA) except MRSA. He has not been taking PO doxycycline since his last admission and do not think we need to continue this further with no recent cx positive with MRSA  - Fu in 2 weeks prior to EOT, plan to switch to PO abtx at that time   # Medication Monitoring  - 8/12 CBC, BMP unremarkable. Normal ESR and CRP   # PICC  - no concerns   I have personally spent 55 minutes involved in face-to-face and non-face-to-face activities for this patient on the day of the visit. Professional time spent includes the following activities: Preparing to see the patient (review of tests), Obtaining and/or reviewing separately obtained history (admission/discharge record), Performing a medically appropriate examination and/or evaluation , Ordering medications/tests/procedures, referring and communicating with other health care professionals, Documenting clinical information in the EMR, Independently interpreting results (not separately reported), Communicating results to the patient/family/caregiver, Counseling and educating the patient/family/caregiver and Care coordination (not separately reported).   Victoriano Lain, MD [facility] 04/08/2023, 11:16 AM

## 2023-04-09 ENCOUNTER — Telehealth: Payer: Self-pay | Admitting: Family

## 2023-04-10 NOTE — Telephone Encounter (Signed)
Aware order faxed to [facility] at [phone]

## 2023-04-12 DIAGNOSIS — Z452 Encounter for adjustment and management of vascular access device: Secondary | ICD-10-CM | POA: Insufficient documentation

## 2023-04-15 LAB — LAB REPORT - SCANNED: EGFR: 114

## 2023-04-22 ENCOUNTER — Encounter: Payer: Self-pay | Admitting: Internal Medicine

## 2023-04-22 ENCOUNTER — Other Ambulatory Visit (HOSPITAL_COMMUNITY): Payer: Self-pay

## 2023-04-22 ENCOUNTER — Ambulatory Visit: Payer: Medicare HMO | Admitting: Internal Medicine

## 2023-04-22 ENCOUNTER — Other Ambulatory Visit: Payer: Self-pay

## 2023-04-22 ENCOUNTER — Telehealth: Payer: Self-pay

## 2023-04-22 VITALS — BP 122/79 | HR 70 | Temp 98.1°F

## 2023-04-22 DIAGNOSIS — M4645 Discitis, unspecified, thoracolumbar region: Secondary | ICD-10-CM

## 2023-04-22 DIAGNOSIS — M4625 Osteomyelitis of vertebra, thoracolumbar region: Secondary | ICD-10-CM | POA: Diagnosis not present

## 2023-04-22 MED ORDER — HYDROXYZINE HCL 10 MG PO TABS
10.0000 mg | ORAL_TABLET | Freq: Three times a day (TID) | ORAL | 2 refills | Status: DC
  Filled 2023-04-22: qty 90, 30d supply, fill #0
  Filled 2023-08-19: qty 90, 30d supply, fill #1
  Filled 2023-09-15 (×2): qty 90, 30d supply, fill #2

## 2023-04-22 MED ORDER — NALOXONE HCL 4 MG/0.1ML NA LIQD
NASAL | 1 refills | Status: AC
  Filled 2023-04-22: qty 2, 1d supply, fill #0

## 2023-04-22 MED ORDER — MORPHINE SULFATE ER 30 MG PO CP24
30.0000 mg | ORAL_CAPSULE | Freq: Two times a day (BID) | ORAL | 0 refills | Status: DC
  Filled 2023-04-30 – 2023-05-01 (×2): qty 60, 30d supply, fill #0

## 2023-04-22 MED ORDER — CEFADROXIL 500 MG PO CAPS: 500.0000 mg | ORAL_CAPSULE | Freq: Two times a day (BID) | ORAL | 1 refills | Status: DC

## 2023-04-22 MED ORDER — CIPROFLOXACIN HCL 750 MG PO TABS: 750.0000 mg | ORAL_TABLET | Freq: Two times a day (BID) | ORAL | 1 refills | Status: DC

## 2023-04-22 MED ORDER — BACLOFEN 5 MG PO TABS
5.0000 mg | ORAL_TABLET | Freq: Three times a day (TID) | ORAL | 2 refills | Status: DC
  Filled 2023-04-22: qty 90, 30d supply, fill #0
  Filled 2023-08-19: qty 90, 30d supply, fill #1

## 2023-04-22 MED ORDER — OXYCODONE-ACETAMINOPHEN 10-325 MG PO TABS
1.0000 | ORAL_TABLET | Freq: Every day | ORAL | 0 refills | Status: AC
  Filled 2023-05-26 (×2): qty 15, 3d supply, fill #0

## 2023-04-22 MED ORDER — OXYCODONE-ACETAMINOPHEN 10-325 MG PO TABS
1.0000 | ORAL_TABLET | Freq: Every day | ORAL | 0 refills | Status: DC
  Filled 2023-04-26 (×2): qty 150, 30d supply, fill #0

## 2023-04-22 NOTE — Telephone Encounter (Signed)
Per Dr. Luciana Axe pull picc and stop IV abx on 8/28 after last dose. Sent a message to Renea Ee RN at [employer] about orders.

## 2023-04-22 NOTE — Progress Notes (Unsigned)
   Subjective:    Patient ID: John Ware, male    DOB: Apr 26, 1966, 57 y.o.   MRN: [id number]  HPI John Ware is here for follow up of discitis/osteomyelitis with phlegmon.   He has a history of disseminated MSSA infection, native TV endocarditis and septic pulmonary emboli and thoracolumbar discitis, osteomyelitis and associated hardware and Pseudomonas infection treated with IV cefepime then cephalexin in 2022 then MRSA bacteremia and worsening lumbar discitis, osteomyelitis s/p lumbar laminotomies also in 2022.  He was treated with prolonged IV MRSA coverage followed by doxycycline.  In 2023 he returned and found to have thoracic osteomyelitis s/p decompression by [name] in August 2023 and treated again with prolonged IV cefepime with Pseudomonas in culture and continued on doxycycline then continued on doxycycline + ciprofloxacin.  No hardware in the thoracic region.  Then in April 2024 had worsening symptoms and MSSE in culture and treated for that but returned again with intractable pain and MRI with worsening findings and now on treatment with IV cefepime for 8 weeks.     He feels significantly better now.  He is moving more and participating in rehab.  Pleased with his progress.  Tollerating antibiotics well, no associated rash or diarrhea.      Review of Systems  Constitutional:  Negative for chills, fatigue and fever.  Gastrointestinal:  Negative for diarrhea and nausea.  Skin:  Negative for rash.       Objective:   Physical Exam Eyes:     General: No scleral icterus. Pulmonary:     Effort: Pulmonary effort is normal.  Neurological:     Mental Status: He is alert.   SH: no tobacco currently        Assessment & Plan:

## 2023-04-23 ENCOUNTER — Other Ambulatory Visit: Payer: Self-pay | Admitting: Family

## 2023-04-23 ENCOUNTER — Other Ambulatory Visit (HOSPITAL_COMMUNITY): Payer: Self-pay

## 2023-04-23 DIAGNOSIS — E1169 Type 2 diabetes mellitus with other specified complication: Secondary | ICD-10-CM

## 2023-04-24 ENCOUNTER — Telehealth: Payer: Self-pay | Admitting: Family

## 2023-04-24 ENCOUNTER — Encounter: Payer: Self-pay | Admitting: Internal Medicine

## 2023-04-24 NOTE — Assessment & Plan Note (Signed)
Very long course of treatment and different organisms has been very difficult to eliminate but fortunately he is improving well now.  I am hopeful he will continue this way.  I will have him complete the cefepime for 8 weeks then transition to oral cipro 750 mg twice a day for many month and will consider lowering to 500 mg twice a day at some point.  For the thoracic region, there is no hardware so can consider stopping at some point but will plan on a prolonged course regardless (6-12 months) and will get a follow up MRI in 2-3 months.  Will also continue with cefadroxil once cefepime stopped with his MSSA and MSSE history and seems to be recovering well without the MRSA coverage.  I would consider this lifelong with the lumbar hardware, though will transition to 500 mg twice daily in 6-12 months.   Follow up in 2 weeks prior to stopping  I have personally spent 55 minutes involved in face-to-face and non-face-to-face activities for this patient on the day of the visit. Professional time spent includes the following activities: Preparing to see the patient (review of tests), Obtaining and/or reviewing separately obtained history (admission/discharge record), Performing a medically appropriate examination and/or evaluation , Ordering medications/tests/procedures, referring and communicating with other health care professionals, Documenting clinical information in the EMR, Independently interpreting results (not separately reported), Communicating results to the patient/family/caregiver, Counseling and educating the patient/family/caregiver and Care coordination (not separately reported).

## 2023-04-24 NOTE — Telephone Encounter (Signed)
Aware these were received and on providers desk for signature.

## 2023-04-25 ENCOUNTER — Other Ambulatory Visit: Payer: Self-pay | Admitting: Family

## 2023-04-26 ENCOUNTER — Other Ambulatory Visit (HOSPITAL_COMMUNITY): Payer: Self-pay

## 2023-04-26 ENCOUNTER — Other Ambulatory Visit (HOSPITAL_BASED_OUTPATIENT_CLINIC_OR_DEPARTMENT_OTHER): Payer: Self-pay

## 2023-04-29 ENCOUNTER — Encounter (INDEPENDENT_AMBULATORY_CARE_PROVIDER_SITE_OTHER): Payer: Medicare HMO

## 2023-04-29 DIAGNOSIS — R531 Weakness: Secondary | ICD-10-CM

## 2023-04-29 DIAGNOSIS — M4712 Other spondylosis with myelopathy, cervical region: Secondary | ICD-10-CM

## 2023-04-29 NOTE — Telephone Encounter (Signed)
Hello, I have placed referral for PT.   Jannifer Rodney, FNP   Approximately 5 minutes was spent documenting and reviewing patient's chart.

## 2023-05-01 ENCOUNTER — Other Ambulatory Visit (HOSPITAL_COMMUNITY): Payer: Self-pay

## 2023-05-02 ENCOUNTER — Telehealth: Payer: Self-pay | Admitting: Family

## 2023-05-02 ENCOUNTER — Other Ambulatory Visit (HOSPITAL_COMMUNITY): Payer: Self-pay

## 2023-05-06 ENCOUNTER — Ambulatory Visit: Payer: Medicare HMO | Attending: Family | Admitting: Physical Therapy

## 2023-05-06 ENCOUNTER — Encounter: Payer: Self-pay | Admitting: Physical Therapy

## 2023-05-06 ENCOUNTER — Other Ambulatory Visit: Payer: Self-pay

## 2023-05-06 DIAGNOSIS — M4712 Other spondylosis with myelopathy, cervical region: Secondary | ICD-10-CM | POA: Insufficient documentation

## 2023-05-06 DIAGNOSIS — M6281 Muscle weakness (generalized): Secondary | ICD-10-CM | POA: Insufficient documentation

## 2023-05-06 DIAGNOSIS — R531 Weakness: Secondary | ICD-10-CM | POA: Insufficient documentation

## 2023-05-06 DIAGNOSIS — R2681 Unsteadiness on feet: Secondary | ICD-10-CM | POA: Insufficient documentation

## 2023-05-06 NOTE — Therapy (Signed)
OUTPATIENT PHYSICAL THERAPY LOWER EXTREMITY EVALUATION   Patient Name: John Ware MRN: [id number] DOB:06-10-1966, 57 y.o., male Today's Date: 05/06/2023  END OF SESSION:  PT End of Session - 05/06/23 1548     Visit Number 1    Number of Visits 12    Date for PT Re-Evaluation 06/17/23    PT Start Time 0315    PT Stop Time 0348    PT Time Calculation (min) 33 min    Activity Tolerance Patient tolerated treatment well    Behavior During Therapy WFL for tasks assessed/performed             Past Medical History:  Diagnosis Date   Depression    Diabetes mellitus without complication (HCC)    Hypertension    Spinal stenosis    With Neurogenic Claudication   Past Surgical History:  Procedure Laterality Date   CARPAL TUNNEL RELEASE Bilateral    INCISION AND DRAINAGE ABSCESS Left 03/12/2021   Procedure: INCISION AND DRAINAGE ABSCESS;  Surgeon: [name], MD;  Location: [facility];  Service: Orthopedics;  Laterality: Left;   IR FLUORO GUIDED NEEDLE PLC ASPIRATION/INJECTION LOC  04/24/2022   IR FLUORO GUIDED NEEDLE PLC ASPIRATION/INJECTION LOC  12/05/2022   IR FLUORO GUIDED NEEDLE PLC ASPIRATION/INJECTION LOC  03/05/2023   IR FLUORO GUIDED NEEDLE PLC ASPIRATION/INJECTION LOC  03/05/2023   IR US GUIDE BX ASP/DRAIN  02/02/2021   IRRIGATION AND DEBRIDEMENT ABSCESS Left 02/08/2021   Procedure: MINOR INCISION AND DRAINAGE OF ABSCESS;  Surgeon: [name], MD;  Location: [facility];  Service: Orthopedics;  Laterality: Left;   IRRIGATION AND DEBRIDEMENT ABSCESS Left 02/10/2021   Procedure: IRRIGATION AND DEBRIDEMENT ABSCESS LEFT LATERAL CHEST WALL;  Surgeon: [name], MD;  Location: [facility];  Service: General;  Laterality: Left;   IRRIGATION AND DEBRIDEMENT ABSCESS Bilateral 02/20/2021   Procedure: MINOR INCISION AND DRAINAGE OF ABSCESS/WITH BILATERAL WOUND VAC PLACEMENT;  Surgeon: [name], MD;  Location: [facility];  Service: Orthopedics;  Laterality: Bilateral;   IRRIGATION AND  DEBRIDEMENT SHOULDER Bilateral 02/28/2021   Procedure: IRRIGATION AND DEBRIDEMENT SHOULDER;  Surgeon: [name], MD;  Location: [facility];  Service: Orthopedics;  Laterality: Bilateral;   IRRIGATION AND DEBRIDEMENT SHOULDER Left 03/06/2021   Procedure: IRRIGATION AND DEBRIDEMENT SHOULDER;  Surgeon: [name], MD;  Location: [facility];  Service: Orthopedics;  Laterality: Left;   IRRIGATION AND DEBRIDEMENT SHOULDER Left 03/08/2021   Procedure: IRRIGATION AND DEBRIDEMENT SHOULDER;  Surgeon: [name], MD;  Location: [facility];  Service: Orthopedics;  Laterality: Left;   LUMBAR LAMINECTOMY FOR EPIDURAL ABSCESS N/A 05/02/2022   Procedure: THORACIC  LAMINECTOMY AND DEBRIDEMENT  FOR EPIDURAL ABSCESS;  Surgeon: [name], MD;  Location: [facility];  Service: Neurosurgery;  Laterality: N/A;   LUMBAR LAMINECTOMY/DECOMPRESSION MICRODISCECTOMY N/A 12/13/2014   Procedure: LUMBAR THREE-FOUR, LUMBAR FOUR-FIVE, LUMBAR FIVE-SACRAL ONE LAMINECTOMY;  Surgeon: [name], MD;  Location: [facility];  Service: Neurosurgery;  Laterality: N/A;  L3-4 L4-5 L5-S1 Laminectomy   LUMBAR LAMINECTOMY/DECOMPRESSION MICRODISCECTOMY Left 06/05/2021   Procedure: Left lumbar one-two, Lumbar two-three Laminectomy;  Surgeon: [name], MD;  Location: [facility];  Service: Neurosurgery;  Laterality: Left;   MENISCUS REPAIR     Right knee   MINOR APPLICATION OF WOUND VAC Left 02/20/2021   Procedure: MINOR APPLICATION OF KERLIX PACKING/REMOVAL OF WOUND VAC;  Surgeon: [name], MD;  Location: [facility];  Service: General;  Laterality: Left;   RADIOLOGY WITH ANESTHESIA N/A 02/25/2023   Procedure: MRI WITH ANESTHESIA  OF THORACIC AND LUMBAR WITH AND WITHOUT CONTRAST;  Surgeon: Radiologist, Medication, MD;  Location: [facility];  Service: Radiology;  Laterality: N/A;   RADIOLOGY WITH ANESTHESIA N/A 03/05/2023   Procedure: disc aspiration;  Surgeon: Baldemar Lenis, MD;  Location: [facility] OR;  Service: Radiology;  Laterality: N/A;   TEE  WITHOUT CARDIOVERSION N/A 02/05/2021   Procedure: TRANSESOPHAGEAL ECHOCARDIOGRAM (TEE);  Surgeon: Quintella Reichert, MD;  Location: [facility];  Service: Cardiovascular;  Laterality: N/A;   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   TOTAL SHOULDER ARTHROPLASTY Right 02/10/2021   Procedure: RIGHT OPEN SHOULDER I & D, OPEN DISTAL CLAVICLE RESECTION;  Surgeon: [name], MD;  Location: [facility];  Service: Orthopedics;  Laterality: Right;   VASECTOMY     Patient Active Problem List   Diagnosis Date Noted   PICC (peripherally inserted central catheter) in place 04/12/2023   Physical deconditioning 04/07/2023   Hypokalemia 02/22/2023   Chronic back pain 01/30/2023   Intractable back pain 12/04/2022   Medication management 12/04/2022   Vertebral osteomyelitis (HCC) 12/04/2022   Pseudomonas aeruginosa infection 07/07/2022   Osteomyelitis of thoracic region (HCC) 07/03/2022   Osteomyelitis of lumbar spine (HCC) 05/08/2022   Midline low back pain    Numbness    Osteomyelitis (HCC)    Chronic diastolic CHF (congestive heart failure) (HCC) 04/23/2022   Chronic pain 04/23/2022   Osteomyelitis of vertebra of thoracolumbar region (HCC) 04/22/2022   Presence of retained hardware 10/17/2021   Infective myositis 08/29/2021   Discitis of thoracolumbar region 07/31/2021   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   MRSA infection    Diabetic peripheral neuropathy (HCC)    Septic discitis of lumbar region 06/11/2021   Infection of spine (HCC) 05/28/2021   Medication monitoring encounter 05/09/2021   History of COVID-19    Psoas abscess (HCC)    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   Right shoulder pain 02/14/2021   Normocytic anemia 02/14/2021   Thrombocytosis 02/14/2021   Endocarditis 02/05/2021   Vision changes 02/04/2021   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   Pressure injury of skin 02/01/2021   HTN (hypertension) 02/01/2021   Anxiety 02/01/2021   Left shoulder pain 02/01/2021    Hyponatremia 01/31/2021   Positive colorectal cancer screening using Cologuard test 12/28/2020   DOE (dyspnea on exertion) 10/20/2020   Diabetic polyneuropathy associated with type 2 diabetes mellitus (HCC) 10/09/2020   DM2 (diabetes mellitus, type 2) (HCC) 10/30/2018   Lumbar stenosis with neurogenic claudication 10/14/2017   DDD (degenerative disc disease), lumbar 02/17/2017   Transient weakness of right lower extremity 02/05/2017   Hyperlipidemia associated with type 2 diabetes mellitus (HCC) 10/24/2016   Depression, recurrent (HCC) 10/24/2016   Right knee pain 05/21/2016   DDD (degenerative disc disease), lumbosacral 05/21/2016   Herniation of nucleus pulposus of cervical intervertebral disc without myelopathy 02/29/2016   Carpal tunnel syndrome 02/08/2016   Cervical spondylosis with myelopathy 01/17/2016   Lumbar stenosis 12/13/2014    REFERRING PROVIDER: Jannifer Rodney  REFERRING DIAG: Generalized weakness.  THERAPY DIAG:  Muscle weakness (generalized)  Unsteadiness on feet  Rationale for Evaluation and Treatment: Rehabilitation  ONSET DATE: Per patient "2022."  SUBJECTIVE:   SUBJECTIVE STATEMENT: The patient presents to the clinic today with a diagnosis of generalized weakness.  He has been fighting infections (please see above) over the last few years s/p multiple spinal surgeries.  He states he is on an antibiotic (he states he will be on antibiotics for life)  that has been very helpful.  He presented to the clinic today in a wheelchair but does use a FWW at home and has not fallen.  He has a ramp to enter and exit his home. He goal is to regain as much independence as he can and would one day like to walk again independently without assistive device.  He has neuropathy and states both his feet are numb.  PERTINENT HISTORY: Please see above. PAIN:  Are you having pain? 2/10.  Legs, numb.  PRECAUTIONS: Fall.  High fall risk.  Please use gait belt and be with patient at  all times.    WEIGHT BEARING RESTRICTIONS: No  FALLS:  Has patient fallen in last 6 months? No  LIVING ENVIRONMENT: Lives with: lives with their spouse Lives in: House/apartment Stairs:  Ramp. Has following equipment at home: Dan Humphreys - 2 wheeled  OCCUPATION: Retired/Disabled.  PLOF: Independent with household mobility with device.  Using a FWW at home and also wheelchair.  PATIENT GOALS: He wants to walk independently again.  OBJECTIVE:    SENSATION: He c/o bilateral foot numbness.  POSTURE: rounded shoulders, forward head, and flexed trunk .  He stands in 30 degrees of trunk flexion and states he cannot straighten further.   LOWER EXTREMITY ROM:  His bilateral LE range of motion assessed in supine is WFL.  LOWER EXTREMITY MMT:  MMT Right eval Left eval  Hip flexion 4+/5 4+/5  Hip extension    Hip abduction 4+/5 4/5  Hip adduction    Hip internal rotation    Hip external rotation    Knee flexion 4+/5 4+/5  Knee extension 4+/5 4+/5  Ankle dorsiflexion 4+/5 4+/5  Ankle plantarflexion 4+/5 4+/5  Ankle inversion    Ankle eversion     (Blank rows = not tested)  FUNCTIONAL TESTS:  5 times sit to stand: 18 seconds Timed up and go (TUG): 38 seconds (with FWW).  DTR's:  Left Pat absent, Right 2+/4+, absent bilateral Achilles.  GAIT: The patient walks with a FWW (used gait belt).  His gait is axtaxic in nature and he walks with  stiff-legged gait pattern without knee flexion and sustained dorsiflexion.  TRANSFERS:  Sit to stand with armrest usage and supervision.  Sit to supine to sit with supervision.   PATIENT EDUCATION:   HOME EXERCISE PROGRAM:  ASSESSMENT:  CLINICAL IMPRESSION: The patient presents to [facility] with with a c/o of difficulty walking.  He has a h/o of infection and is on long-term antibiotics.  The patient uses a wheelchair but also walks with a FWW.  He reports no falls though he is a high fall risk.  He gait is ataxic in nature and he  walks in 30 degrees of trunk flexion without knee flexion and sustained dorsiflexion.  His TUG is 38 seconds (with FWW) and five time sit to stand is 18 seconds.  He is very motivated to improve and has a goal of walking independently again.  He LE strength is quite good.    OBJECTIVE IMPAIRMENTS: Abnormal gait, decreased activity tolerance, decreased balance, decreased coordination, difficulty walking, postural dysfunction, and pain.   ACTIVITY LIMITATIONS: carrying, lifting, bending, standing, squatting, stairs, bathing, dressing, and locomotion level  PARTICIPATION LIMITATIONS: meal prep, cleaning, medication management, shopping, community activity, and yard work  PERSONAL FACTORS: Time since onset of injury/illness/exacerbation and 1-2 comorbidities: multiple spinal surgeries and infection  are also affecting patient's functional outcome.   REHAB POTENTIAL: Fair plus/good minus  CLINICAL DECISION MAKING: Evolving/moderate  complexity  EVALUATION COMPLEXITY: Moderate   GOALS:  LONG TERM GOALS: Target date: 06/17/23  Ind with a HEP.  Goal status: INITIAL  2.  Improve TUG with appropriate assistive device to 22 seconds  Goal status: INITIAL  3.  5 time sit to stand performed in 13-14 seconds.  Goal status: INITIAL  4.  Walk with appropriate assistive device 250 feet exhibiting normal knee flexion.  Goal status: INITIAL  5.  Perform a reciprocating stair gait with one railing.  Goal status: INITIAL  PLAN:  PT FREQUENCY: 2x/week  PT DURATION: 6 weeks  PLANNED INTERVENTIONS: Therapeutic exercises, Therapeutic activity, Neuromuscular re-education, Balance training, Gait training, Patient/Family education, Self Care, and Stair training  PLAN FOR NEXT SESSION: Nustep, gait and balance activities.  Neuro re-education.   Nalany Steedley, [place], PT 05/06/2023, 5:32 PM

## 2023-05-07 ENCOUNTER — Ambulatory Visit: Payer: Medicare HMO | Admitting: Internal Medicine

## 2023-05-08 ENCOUNTER — Ambulatory Visit: Payer: Medicare HMO

## 2023-05-08 DIAGNOSIS — M6281 Muscle weakness (generalized): Secondary | ICD-10-CM

## 2023-05-08 DIAGNOSIS — R2681 Unsteadiness on feet: Secondary | ICD-10-CM

## 2023-05-08 NOTE — Therapy (Signed)
OUTPATIENT PHYSICAL THERAPY LOWER EXTREMITY TREATMENT   Patient Name: John Ware MRN: [id number] DOB:[birth year], 57 y.o., male Today's Date: 05/08/2023  END OF SESSION:  PT End of Session - 05/08/23 1606     Visit Number 2    Number of Visits 12    Date for PT Re-Evaluation 06/17/23    PT Start Time 1600    PT Stop Time 1641    PT Time Calculation (min) 41 min    Activity Tolerance Patient tolerated treatment well    Behavior During Therapy WFL for tasks assessed/performed              Past Medical History:  Diagnosis Date   Depression    Diabetes mellitus without complication (HCC)    Hypertension    Spinal stenosis    With Neurogenic Claudication   Past Surgical History:  Procedure Laterality Date   CARPAL TUNNEL RELEASE Bilateral    INCISION AND DRAINAGE ABSCESS Left 03/12/2021   Procedure: INCISION AND DRAINAGE ABSCESS;  Surgeon: [name], MD;  Location: [facility];  Service: Orthopedics;  Laterality: Left;   IR FLUORO GUIDED NEEDLE PLC ASPIRATION/INJECTION LOC  04/24/2022   IR FLUORO GUIDED NEEDLE PLC ASPIRATION/INJECTION LOC  12/05/2022   IR FLUORO GUIDED NEEDLE PLC ASPIRATION/INJECTION LOC  03/05/2023   IR FLUORO GUIDED NEEDLE PLC ASPIRATION/INJECTION LOC  03/05/2023   IR US GUIDE BX ASP/DRAIN  02/02/2021   IRRIGATION AND DEBRIDEMENT ABSCESS Left 02/08/2021   Procedure: MINOR INCISION AND DRAINAGE OF ABSCESS;  Surgeon: [name], MD;  Location: [facility];  Service: Orthopedics;  Laterality: Left;   IRRIGATION AND DEBRIDEMENT ABSCESS Left 02/10/2021   Procedure: IRRIGATION AND DEBRIDEMENT ABSCESS LEFT LATERAL CHEST WALL;  Surgeon: [name], MD;  Location: [facility];  Service: General;  Laterality: Left;   IRRIGATION AND DEBRIDEMENT ABSCESS Bilateral 02/20/2021   Procedure: MINOR INCISION AND DRAINAGE OF ABSCESS/WITH BILATERAL WOUND VAC PLACEMENT;  Surgeon: [name], MD;  Location: [facility];  Service: Orthopedics;  Laterality: Bilateral;   IRRIGATION AND  DEBRIDEMENT SHOULDER Bilateral 02/28/2021   Procedure: IRRIGATION AND DEBRIDEMENT SHOULDER;  Surgeon: [name], MD;  [facility]: [facility];  Service: Orthopedics;  Laterality: Bilateral;   IRRIGATION AND DEBRIDEMENT SHOULDER Left 03/06/2021   Procedure: IRRIGATION AND DEBRIDEMENT SHOULDER;  Surgeon: [name], MD;  [facility]: [facility];  Service: Orthopedics;  Laterality: Left;   IRRIGATION AND DEBRIDEMENT SHOULDER Left 03/08/2021   Procedure: IRRIGATION AND DEBRIDEMENT SHOULDER;  Surgeon: [name], MD;  [facility]: [facility];  Service: Orthopedics;  Laterality: Left;   LUMBAR LAMINECTOMY FOR EPIDURAL ABSCESS N/A 05/02/2022   Procedure: THORACIC  LAMINECTOMY AND DEBRIDEMENT  FOR EPIDURAL ABSCESS;  Surgeon: [name], MD;  Location: [facility];  Service: Neurosurgery;  Laterality: N/A;   LUMBAR LAMINECTOMY/DECOMPRESSION MICRODISCECTOMY N/A 12/13/2014   Procedure: LUMBAR THREE-FOUR, LUMBAR FOUR-FIVE, LUMBAR FIVE-SACRAL ONE LAMINECTOMY;  Surgeon: [name], MD;  Location: [facility];  Service: Neurosurgery;  Laterality: N/A;  L3-4 L4-5 L5-S1 Laminectomy   LUMBAR LAMINECTOMY/DECOMPRESSION MICRODISCECTOMY Left 06/05/2021   Procedure: Left lumbar one-two, Lumbar two-three Laminectomy;  Surgeon: [name], MD;  Location: [facility] OR;  Service: Neurosurgery;  Laterality: Left;   MENISCUS REPAIR     Right knee   MINOR APPLICATION OF WOUND VAC Left 02/20/2021   Procedure: MINOR APPLICATION OF KERLIX PACKING/REMOVAL OF WOUND VAC;  Surgeon: [name], MD;  Location: [facility];  Service: General;  Laterality: Left;   RADIOLOGY WITH ANESTHESIA N/A 02/25/2023   Procedure: MRI WITH  ANESTHESIA OF THORACIC AND LUMBAR WITH AND WITHOUT CONTRAST;  Surgeon: Radiologist, Medication, MD;  Location: [facility];  Service: Radiology;  Laterality: N/A;   RADIOLOGY WITH ANESTHESIA N/A 03/05/2023   Procedure: disc aspiration;  Surgeon: Baldemar Lenis, MD;  Location: [facility] OR;  Service: Radiology;  Laterality: N/A;   TEE  WITHOUT CARDIOVERSION N/A 02/05/2021   Procedure: TRANSESOPHAGEAL ECHOCARDIOGRAM (TEE);  Surgeon: Quintella Reichert, MD;  Location: [facility];  Service: Cardiovascular;  Laterality: N/A;   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   TOTAL SHOULDER ARTHROPLASTY Right 02/10/2021   Procedure: RIGHT OPEN SHOULDER I & D, OPEN DISTAL CLAVICLE RESECTION;  Surgeon: [name], MD;  [facility]: [facility];  Service: Orthopedics;  Laterality: Right;   VASECTOMY     Patient Active Problem List   Diagnosis Date Noted   PICC (peripherally inserted central catheter) in place 04/12/2023   Physical deconditioning 04/07/2023   Hypokalemia 02/22/2023   Chronic back pain 01/30/2023   Intractable back pain 12/04/2022   Medication management 12/04/2022   Vertebral osteomyelitis (HCC) 12/04/2022   Pseudomonas aeruginosa infection 07/07/2022   Osteomyelitis of thoracic region (HCC) 07/03/2022   Osteomyelitis of lumbar spine (HCC) 05/08/2022   Midline low back pain    Numbness    Osteomyelitis (HCC)    Chronic diastolic CHF (congestive heart failure) (HCC) 04/23/2022   Chronic pain 04/23/2022   Osteomyelitis of vertebra of thoracolumbar region (HCC) 04/22/2022   Presence of retained hardware 10/17/2021   Infective myositis 08/29/2021   Discitis of thoracolumbar region 07/31/2021   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   MRSA infection    Diabetic peripheral neuropathy (HCC)    Septic discitis of lumbar region 06/11/2021   Infection of spine (HCC) 05/28/2021   Medication monitoring encounter 05/09/2021   History of COVID-19    Psoas abscess (HCC)    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   Right shoulder pain 02/14/2021   Normocytic anemia 02/14/2021   Thrombocytosis 02/14/2021   Endocarditis 02/05/2021   Vision changes 02/04/2021   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   Pressure injury of skin 02/01/2021   HTN (hypertension) 02/01/2021   Anxiety 02/01/2021   Left shoulder pain 02/01/2021    Hyponatremia 01/31/2021   Positive colorectal cancer screening using Cologuard test 12/28/2020   DOE (dyspnea on exertion) 10/20/2020   Diabetic polyneuropathy associated with type 2 diabetes mellitus (HCC) 10/09/2020   DM2 (diabetes mellitus, type 2) (HCC) 10/30/2018   Lumbar stenosis with neurogenic claudication 10/14/2017   DDD (degenerative disc disease), lumbar 02/17/2017   Transient weakness of right lower extremity 02/05/2017   Hyperlipidemia associated with type 2 diabetes mellitus (HCC) 10/24/2016   Depression, recurrent (HCC) 10/24/2016   Right knee pain 05/21/2016   DDD (degenerative disc disease), lumbosacral 05/21/2016   Herniation of nucleus pulposus of cervical intervertebral disc without myelopathy 02/29/2016   Carpal tunnel syndrome 02/08/2016   Cervical spondylosis with myelopathy 01/17/2016   Lumbar stenosis 12/13/2014    REFERRING PROVIDER: Jannifer Rodney  REFERRING DIAG: Generalized weakness.  THERAPY DIAG:  Muscle weakness (generalized)  Unsteadiness on feet  Rationale for Evaluation and Treatment: Rehabilitation  ONSET DATE: Per patient "2022."  SUBJECTIVE:   SUBJECTIVE STATEMENT: Patient reports that he feels alright today.   PERTINENT HISTORY: Please see above. PAIN:  Are you having pain? 2/10.  Legs, numb.  PRECAUTIONS: Fall.  High fall risk.  Please use gait belt and be with patient at all times.  WEIGHT BEARING RESTRICTIONS:  No  FALLS:  Has patient fallen in last 6 months? No  LIVING ENVIRONMENT: Lives with: lives with their spouse Lives in: House/apartment Stairs:  Ramp. Has following equipment at home: Dan Humphreys - 2 wheeled  OCCUPATION: Retired/Disabled.  PLOF: Independent with household mobility with device.  Using a FWW at home and also wheelchair.  PATIENT GOALS: He wants to walk independently again.  OBJECTIVE:    SENSATION: He c/o bilateral foot numbness.  POSTURE: rounded shoulders, forward head, and flexed trunk .  He  stands in 30 degrees of trunk flexion and states he cannot straighten further.   LOWER EXTREMITY ROM:  His bilateral LE range of motion assessed in supine is WFL.  LOWER EXTREMITY MMT:  MMT Right eval Left eval  Hip flexion 4+/5 4+/5  Hip extension    Hip abduction 4+/5 4/5  Hip adduction    Hip internal rotation    Hip external rotation    Knee flexion 4+/5 4+/5  Knee extension 4+/5 4+/5  Ankle dorsiflexion 4+/5 4+/5  Ankle plantarflexion 4+/5 4+/5  Ankle inversion    Ankle eversion     (Blank rows = not tested)  FUNCTIONAL TESTS:  5 times sit to stand: 18 seconds Timed up and go (TUG): 38 seconds (with FWW).  DTR's:  Left Pat absent, Right 2+/4+, absent bilateral Achilles.  GAIT: The patient walks with a FWW (used gait belt).  His gait is axtaxic in nature and he walks with  stiff-legged gait pattern without knee flexion and sustained dorsiflexion.  TRANSFERS:  Sit to stand with armrest usage and supervision.  Sit to supine to sit with supervision.  TODAY'S TREATMENT:                                    05/08/23 EXERCISE LOG  Exercise Repetitions and Resistance Comments  Nustep  L3 x 16 minutes   LAQ 2# x 2 minutes    Seated marching  2# x 3 minutes    Seated hee/toe raises  2# x 3 minutes    Seated hip ADD isometric  2 minutes w/ 5 second hold    Blank cell = exercise not performed today   PATIENT EDUCATION:   Patient was educated on the benefits of exercise. He reported understanding.  HOME EXERCISE PROGRAM:  ASSESSMENT:  CLINICAL IMPRESSION: Patient was introduced to multiple new interventions for improved lower extremity strength with moderate difficulty. He required minimal cueing with today's new interventions for proper exercise performance. He experienced no significant increase pain or discomfort with any of today's interventions. He reported feeling good upon the conclusion of treatment. He continues to require skilled physical therapy to address  his remaining impairments to maximize his functional mobility.     OBJECTIVE IMPAIRMENTS: Abnormal gait, decreased activity tolerance, decreased balance, decreased coordination, difficulty walking, postural dysfunction, and pain.   ACTIVITY LIMITATIONS: carrying, lifting, bending, standing, squatting, stairs, bathing, dressing, and locomotion level  PARTICIPATION LIMITATIONS: meal prep, cleaning, medication management, shopping, community activity, and yard work  PERSONAL FACTORS: Time since onset of injury/illness/exacerbation and 1-2 comorbidities: multiple spinal surgeries and infection  are also affecting patient's functional outcome.   REHAB POTENTIAL: Fair plus/good minus  CLINICAL DECISION MAKING: Evolving/moderate complexity  EVALUATION COMPLEXITY: Moderate   GOALS:  LONG TERM GOALS: Target date: 06/17/23  Ind with a HEP.  Goal status: INITIAL  2.  Improve TUG with appropriate assistive device to 22 seconds  Goal status: INITIAL  3.  5 time sit to stand performed in 13-14 seconds.  Goal status: INITIAL  4.  Walk with appropriate assistive device 250 feet exhibiting normal knee flexion.  Goal status: INITIAL  5.  Perform a reciprocating stair gait with one railing.  Goal status: INITIAL  PLAN:  PT FREQUENCY: 2x/week  PT DURATION: 6 weeks  PLANNED INTERVENTIONS: Therapeutic exercises, Therapeutic activity, Neuromuscular re-education, Balance training, Gait training, Patient/Family education, Self Care, and Stair training  PLAN FOR NEXT SESSION: Nustep, gait and balance activities.  Neuro re-education.   Granville Lewis, PT 05/08/2023, 5:55 PM

## 2023-05-13 ENCOUNTER — Ambulatory Visit: Payer: Medicare HMO | Admitting: *Deleted

## 2023-05-15 ENCOUNTER — Ambulatory Visit: Payer: Medicare HMO | Admitting: Physical Therapy

## 2023-05-15 DIAGNOSIS — R2681 Unsteadiness on feet: Secondary | ICD-10-CM

## 2023-05-15 DIAGNOSIS — M6281 Muscle weakness (generalized): Secondary | ICD-10-CM | POA: Diagnosis not present

## 2023-05-15 NOTE — Therapy (Signed)
OUTPATIENT PHYSICAL THERAPY LOWER EXTREMITY TREATMENT   Patient Name: John Ware MRN: [id number] DOB:1966-02-14, 57 y.o., male Today's Date: 05/15/2023  END OF SESSION:  PT End of Session - 05/15/23 1749     Visit Number 3    Number of Visits 12    Date for PT Re-Evaluation 06/17/23    PT Start Time 0455   Running late.   PT Stop Time 0532    PT Time Calculation (min) 37 min    Activity Tolerance Patient tolerated treatment well    Behavior During Therapy WFL for tasks assessed/performed               Past Medical History:  Diagnosis Date   Depression    Diabetes mellitus without complication (HCC)    Hypertension    Spinal stenosis    With Neurogenic Claudication   Past Surgical History:  Procedure Laterality Date   CARPAL TUNNEL RELEASE Bilateral    INCISION AND DRAINAGE ABSCESS Left 03/12/2021   Procedure: INCISION AND DRAINAGE ABSCESS;  Surgeon: [name], MD;  Location: [facility];  Service: Orthopedics;  Laterality: Left;   IR FLUORO GUIDED NEEDLE PLC ASPIRATION/INJECTION LOC  04/24/2022   IR FLUORO GUIDED NEEDLE PLC ASPIRATION/INJECTION LOC  12/05/2022   IR FLUORO GUIDED NEEDLE PLC ASPIRATION/INJECTION LOC  03/05/2023   IR FLUORO GUIDED NEEDLE PLC ASPIRATION/INJECTION LOC  03/05/2023   IR US GUIDE BX ASP/DRAIN  02/02/2021   IRRIGATION AND DEBRIDEMENT ABSCESS Left 02/08/2021   Procedure: MINOR INCISION AND DRAINAGE OF ABSCESS;  Surgeon: [name], MD;  [facility]: [facility];  Service: Orthopedics;  Laterality: Left;   IRRIGATION AND DEBRIDEMENT ABSCESS Left 02/10/2021   Procedure: IRRIGATION AND DEBRIDEMENT ABSCESS LEFT LATERAL CHEST WALL;  Surgeon: [name], MD;  Location: [facility];  Service: General;  Laterality: Left;   IRRIGATION AND DEBRIDEMENT ABSCESS Bilateral 02/20/2021   Procedure: MINOR INCISION AND DRAINAGE OF ABSCESS/WITH BILATERAL WOUND VAC PLACEMENT;  Surgeon: [name], MD;  Location: [facility];  Service: Orthopedics;  Laterality: Bilateral;    IRRIGATION AND DEBRIDEMENT SHOULDER Bilateral 02/28/2021   Procedure: IRRIGATION AND DEBRIDEMENT SHOULDER;  Surgeon: [name], MD;  Location: [facility];  Service: Orthopedics;  Laterality: Bilateral;   IRRIGATION AND DEBRIDEMENT SHOULDER Left 03/06/2021   Procedure: IRRIGATION AND DEBRIDEMENT SHOULDER;  Surgeon: [name], MD;  [facility]: [facility];  Service: Orthopedics;  Laterality: Left;   IRRIGATION AND DEBRIDEMENT SHOULDER Left 03/08/2021   Procedure: IRRIGATION AND DEBRIDEMENT SHOULDER;  Surgeon: [name], MD;  Location: [facility];  Service: Orthopedics;  Laterality: Left;   LUMBAR LAMINECTOMY FOR EPIDURAL ABSCESS N/A 05/02/2022   Procedure: THORACIC  LAMINECTOMY AND DEBRIDEMENT  FOR EPIDURAL ABSCESS;  Surgeon: [name], MD;  Location: [facility];  Service: Neurosurgery;  Laterality: N/A;   LUMBAR LAMINECTOMY/DECOMPRESSION MICRODISCECTOMY N/A 12/13/2014   Procedure: LUMBAR THREE-FOUR, LUMBAR FOUR-FIVE, LUMBAR FIVE-SACRAL ONE LAMINECTOMY;  Surgeon: [name], MD;  Location: [facility];  Service: Neurosurgery;  Laterality: N/A;  L3-4 L4-5 L5-S1 Laminectomy   LUMBAR LAMINECTOMY/DECOMPRESSION MICRODISCECTOMY Left 06/05/2021   Procedure: Left lumbar one-two, Lumbar two-three Laminectomy;  Surgeon: [name], MD;  Location: [facility] OR;  Service: Neurosurgery;  Laterality: Left;   MENISCUS REPAIR     Right knee   MINOR APPLICATION OF WOUND VAC Left 02/20/2021   Procedure: MINOR APPLICATION OF KERLIX PACKING/REMOVAL OF WOUND VAC;  Surgeon: [name], MD;  Location: [facility];  Service: General;  Laterality: Left;   RADIOLOGY WITH ANESTHESIA N/A 02/25/2023  Procedure: MRI WITH ANESTHESIA OF THORACIC AND LUMBAR WITH AND WITHOUT CONTRAST;  Surgeon: Radiologist, Medication, MD;  Location: [facility];  Service: Radiology;  Laterality: N/A;   RADIOLOGY WITH ANESTHESIA N/A 03/05/2023   Procedure: disc aspiration;  Surgeon: Baldemar Lenis, MD;  Location: [facility] OR;  Service: Radiology;   Laterality: N/A;   TEE WITHOUT CARDIOVERSION N/A 02/05/2021   Procedure: TRANSESOPHAGEAL ECHOCARDIOGRAM (TEE);  Surgeon: Quintella Reichert, MD;  Location: [facility] ENDOSCOPY;  Service: Cardiovascular;  Laterality: N/A;   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   TOTAL SHOULDER ARTHROPLASTY Right 02/10/2021   Procedure: RIGHT OPEN SHOULDER I & D, OPEN DISTAL CLAVICLE RESECTION;  Surgeon: [name], MD;  Location: [facility];  Service: Orthopedics;  Laterality: Right;   VASECTOMY     Patient Active Problem List   Diagnosis Date Noted   PICC (peripherally inserted central catheter) in place 04/12/2023   Physical deconditioning 04/07/2023   Hypokalemia 02/22/2023   Chronic back pain 01/30/2023   Intractable back pain 12/04/2022   Medication management 12/04/2022   Vertebral osteomyelitis (HCC) 12/04/2022   Pseudomonas aeruginosa infection 07/07/2022   Osteomyelitis of thoracic region (HCC) 07/03/2022   Osteomyelitis of lumbar spine (HCC) 05/08/2022   Midline low back pain    Numbness    Osteomyelitis (HCC)    Chronic diastolic CHF (congestive heart failure) (HCC) 04/23/2022   Chronic pain 04/23/2022   Osteomyelitis of vertebra of thoracolumbar region (HCC) 04/22/2022   Presence of retained hardware 10/17/2021   Infective myositis 08/29/2021   Discitis of thoracolumbar region 07/31/2021   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   MRSA infection    Diabetic peripheral neuropathy (HCC)    Septic discitis of lumbar region 06/11/2021   Infection of spine (HCC) 05/28/2021   Medication monitoring encounter 05/09/2021   History of COVID-19    Psoas abscess (HCC)    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   Right shoulder pain 02/14/2021   Normocytic anemia 02/14/2021   Thrombocytosis 02/14/2021   Endocarditis 02/05/2021   Vision changes 02/04/2021   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   Pressure injury of skin 02/01/2021   HTN (hypertension) 02/01/2021   Anxiety 02/01/2021   Left  shoulder pain 02/01/2021   Hyponatremia 01/31/2021   Positive colorectal cancer screening using Cologuard test 12/28/2020   DOE (dyspnea on exertion) 10/20/2020   Diabetic polyneuropathy associated with type 2 diabetes mellitus (HCC) 10/09/2020   DM2 (diabetes mellitus, type 2) (HCC) 10/30/2018   Lumbar stenosis with neurogenic claudication 10/14/2017   DDD (degenerative disc disease), lumbar 02/17/2017   Transient weakness of right lower extremity 02/05/2017   Hyperlipidemia associated with type 2 diabetes mellitus (HCC) 10/24/2016   Depression, recurrent (HCC) 10/24/2016   Right knee pain 05/21/2016   DDD (degenerative disc disease), lumbosacral 05/21/2016   Herniation of nucleus pulposus of cervical intervertebral disc without myelopathy 02/29/2016   Carpal tunnel syndrome 02/08/2016   Cervical spondylosis with myelopathy 01/17/2016   Lumbar stenosis 12/13/2014    REFERRING PROVIDER: Jannifer Rodney  REFERRING DIAG: Generalized weakness.  THERAPY DIAG:  Muscle weakness (generalized)  Unsteadiness on feet  Rationale for Evaluation and Treatment: Rehabilitation  ONSET DATE: Per patient "2022."  SUBJECTIVE:   SUBJECTIVE STATEMENT: Patient reports that he feels alright today.   PERTINENT HISTORY: Please see above. PAIN:  Are you having pain? 2/10.  Legs, numb.  PRECAUTIONS: Fall.  High fall risk.  Please use gait belt and be with patient at all times.  WEIGHT BEARING RESTRICTIONS: No  FALLS:  Has patient fallen in last 6 months? No  LIVING ENVIRONMENT: Lives with: lives with their spouse Lives in: House/apartment Stairs:  Ramp. Has following equipment at home: Dan Humphreys - 2 wheeled  OCCUPATION: Retired/Disabled.  PLOF: Independent with household mobility with device.  Using a FWW at home and also wheelchair.  PATIENT GOALS: He wants to walk independently again.  OBJECTIVE:    SENSATION: He c/o bilateral foot numbness.  POSTURE: rounded shoulders, forward  head, and flexed trunk .  He stands in 30 degrees of trunk flexion and states he cannot straighten further.   LOWER EXTREMITY ROM:  His bilateral LE range of motion assessed in supine is WFL.  LOWER EXTREMITY MMT:  MMT Right eval Left eval  Hip flexion 4+/5 4+/5  Hip extension    Hip abduction 4+/5 4/5  Hip adduction    Hip internal rotation    Hip external rotation    Knee flexion 4+/5 4+/5  Knee extension 4+/5 4+/5  Ankle dorsiflexion 4+/5 4+/5  Ankle plantarflexion 4+/5 4+/5  Ankle inversion    Ankle eversion     (Blank rows = not tested)  FUNCTIONAL TESTS:  5 times sit to stand: 18 seconds Timed up and go (TUG): 38 seconds (with FWW).  DTR's:  Left Pat absent, Right 2+/4+, absent bilateral Achilles.  GAIT: The patient walks with a FWW (used gait belt).  His gait is axtaxic in nature and he walks with  stiff-legged gait pattern without knee flexion and sustained dorsiflexion.  TRANSFERS:  Sit to stand with armrest usage and supervision.  Sit to supine to sit with supervision.  TODAY'S TREATMENT:   05/14/13:                                     EXERCISE LOG  Exercise Repetitions and Resistance Comments  Nustep  Level 5 x 15 minutes   Red hip abduction and marching  2 minutes each   Airex balance pad  With UE support x 3 minutes   Rockerboard  3 minutes with UE parallel bar support   Standing UBE in partial bilateral knee flexion. 6 minutes (3 minutes forward and 3 minutes backward).    Blank cell = exercise not performed today                                    05/08/23 EXERCISE LOG  Exercise Repetitions and Resistance Comments  Nustep  L3 x 16 minutes   LAQ 2# x 2 minutes    Seated marching  2# x 3 minutes    Seated hee/toe raises  2# x 3 minutes    Seated hip ADD isometric  2 minutes w/ 5 second hold    Blank cell = exercise not performed today   PATIENT EDUCATION:   Patient was educated on the benefits of exercise. He reported understanding.  HOME  EXERCISE PROGRAM:  ASSESSMENT:  CLINICAL IMPRESSION: Patient is highly motivated.  Introduced Multimedia programmer activities comprised of Rockerboard in parallel bars and Airex balance pad with UE support as well.  Gait belt assist also provided.  Patient did well with treatment today and stated he felt more stable attributed to new diabetic shoes he got today.  OBJECTIVE IMPAIRMENTS: Abnormal gait, decreased activity tolerance, decreased balance, decreased coordination, difficulty walking, postural dysfunction,  and pain.   ACTIVITY LIMITATIONS: carrying, lifting, bending, standing, squatting, stairs, bathing, dressing, and locomotion level  PARTICIPATION LIMITATIONS: meal prep, cleaning, medication management, shopping, community activity, and yard work  PERSONAL FACTORS: Time since onset of injury/illness/exacerbation and 1-2 comorbidities: multiple spinal surgeries and infection  are also affecting patient's functional outcome.   REHAB POTENTIAL: Fair plus/good minus  CLINICAL DECISION MAKING: Evolving/moderate complexity  EVALUATION COMPLEXITY: Moderate   GOALS:  LONG TERM GOALS: Target date: 06/17/23  Ind with a HEP.  Goal status: INITIAL  2.  Improve TUG with appropriate assistive device to 22 seconds  Goal status: INITIAL  3.  5 time sit to stand performed in 13-14 seconds.  Goal status: INITIAL  4.  Walk with appropriate assistive device 250 feet exhibiting normal knee flexion.  Goal status: INITIAL  5.  Perform a reciprocating stair gait with one railing.  Goal status: INITIAL  PLAN:  PT FREQUENCY: 2x/week  PT DURATION: 6 weeks  PLANNED INTERVENTIONS: Therapeutic exercises, Therapeutic activity, Neuromuscular re-education, Balance training, Gait training, Patient/Family education, Self Care, and Stair training  PLAN FOR NEXT SESSION: Nustep, gait and balance activities.  Neuro re-education.   Tasmia Blumer, [place], PT 05/15/2023, 5:53 PM

## 2023-05-20 ENCOUNTER — Ambulatory Visit: Payer: Medicare HMO

## 2023-05-20 DIAGNOSIS — R2681 Unsteadiness on feet: Secondary | ICD-10-CM

## 2023-05-20 DIAGNOSIS — M6281 Muscle weakness (generalized): Secondary | ICD-10-CM | POA: Diagnosis not present

## 2023-05-20 NOTE — Therapy (Signed)
OUTPATIENT PHYSICAL THERAPY LOWER EXTREMITY TREATMENT   Patient Name: John Ware MRN: [id number] DOB:1966/08/22, 57 y.o., male Today's Date: 05/20/2023  END OF SESSION:  PT End of Session - 05/20/23 1748     Visit Number 4    Number of Visits 12    Date for PT Re-Evaluation 06/17/23    PT Start Time 1602    PT Stop Time 1645    PT Time Calculation (min) 43 min    Activity Tolerance Patient tolerated treatment well    Behavior During Therapy WFL for tasks assessed/performed                Past Medical History:  Diagnosis Date   Depression    Diabetes mellitus without complication (HCC)    Hypertension    Spinal stenosis    With Neurogenic Claudication   Past Surgical History:  Procedure Laterality Date   CARPAL TUNNEL RELEASE Bilateral    INCISION AND DRAINAGE ABSCESS Left 03/12/2021   Procedure: INCISION AND DRAINAGE ABSCESS;  Surgeon: [name], MD;  Location: [facility];  Service: Orthopedics;  Laterality: Left;   IR FLUORO GUIDED NEEDLE PLC ASPIRATION/INJECTION LOC  04/24/2022   IR FLUORO GUIDED NEEDLE PLC ASPIRATION/INJECTION LOC  12/05/2022   IR FLUORO GUIDED NEEDLE PLC ASPIRATION/INJECTION LOC  03/05/2023   IR FLUORO GUIDED NEEDLE PLC ASPIRATION/INJECTION LOC  03/05/2023   IR US GUIDE BX ASP/DRAIN  02/02/2021   IRRIGATION AND DEBRIDEMENT ABSCESS Left 02/08/2021   Procedure: MINOR INCISION AND DRAINAGE OF ABSCESS;  Surgeon: [name], MD;  Location: [facility];  Service: Orthopedics;  Laterality: Left;   IRRIGATION AND DEBRIDEMENT ABSCESS Left 02/10/2021   Procedure: IRRIGATION AND DEBRIDEMENT ABSCESS LEFT LATERAL CHEST WALL;  Surgeon: [name], MD;  Location: [facility];  Service: General;  Laterality: Left;   IRRIGATION AND DEBRIDEMENT ABSCESS Bilateral 02/20/2021   Procedure: MINOR INCISION AND DRAINAGE OF ABSCESS/WITH BILATERAL WOUND VAC PLACEMENT;  Surgeon: [name], MD;  Location: [facility];  Service: Orthopedics;  Laterality: Bilateral;   IRRIGATION  AND DEBRIDEMENT SHOULDER Bilateral 02/28/2021   Procedure: IRRIGATION AND DEBRIDEMENT SHOULDER;  Surgeon: [name], MD;  [facility]: [facility];  Service: Orthopedics;  Laterality: Bilateral;   IRRIGATION AND DEBRIDEMENT SHOULDER Left 03/06/2021   Procedure: IRRIGATION AND DEBRIDEMENT SHOULDER;  Surgeon: [name], MD;  [facility]: [facility];  Service: Orthopedics;  Laterality: Left;   IRRIGATION AND DEBRIDEMENT SHOULDER Left 03/08/2021   Procedure: IRRIGATION AND DEBRIDEMENT SHOULDER;  Surgeon: [name], MD;  [facility]: [facility];  Service: Orthopedics;  Laterality: Left;   LUMBAR LAMINECTOMY FOR EPIDURAL ABSCESS N/A 05/02/2022   Procedure: THORACIC  LAMINECTOMY AND DEBRIDEMENT  FOR EPIDURAL ABSCESS;  Surgeon: [name], MD;  Location: [facility];  Service: Neurosurgery;  Laterality: N/A;   LUMBAR LAMINECTOMY/DECOMPRESSION MICRODISCECTOMY N/A 12/13/2014   Procedure: LUMBAR THREE-FOUR, LUMBAR FOUR-FIVE, LUMBAR FIVE-SACRAL ONE LAMINECTOMY;  Surgeon: [name], MD;  Location: [facility];  Service: Neurosurgery;  Laterality: N/A;  L3-4 L4-5 L5-S1 Laminectomy   LUMBAR LAMINECTOMY/DECOMPRESSION MICRODISCECTOMY Left 06/05/2021   Procedure: Left lumbar one-two, Lumbar two-three Laminectomy;  Surgeon: [name], MD;  Location: [facility];  Service: Neurosurgery;  Laterality: Left;   MENISCUS REPAIR     Right knee   MINOR APPLICATION OF WOUND VAC Left 02/20/2021   Procedure: MINOR APPLICATION OF KERLIX PACKING/REMOVAL OF WOUND VAC;  Surgeon: [name], MD;  [facility]: [facility];  Service: General;  Laterality: Left;   RADIOLOGY WITH ANESTHESIA N/A 02/25/2023   Procedure:  MRI WITH ANESTHESIA OF THORACIC AND LUMBAR WITH AND WITHOUT CONTRAST;  Surgeon: Radiologist, Medication, MD;  Location: [facility];  Service: Radiology;  Laterality: N/A;   RADIOLOGY WITH ANESTHESIA N/A 03/05/2023   Procedure: disc aspiration;  Surgeon: Baldemar Lenis, MD;  Location: [facility] OR;  Service: Radiology;  Laterality: N/A;    TEE WITHOUT CARDIOVERSION N/A 02/05/2021   Procedure: TRANSESOPHAGEAL ECHOCARDIOGRAM (TEE);  Surgeon: Quintella Reichert, MD;  Location: [facility] ENDOSCOPY;  Service: Cardiovascular;  Laterality: N/A;   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   TOTAL SHOULDER ARTHROPLASTY Right 02/10/2021   Procedure: RIGHT OPEN SHOULDER I & D, OPEN DISTAL CLAVICLE RESECTION;  Surgeon: [name], MD;  [facility]: [facility];  Service: Orthopedics;  Laterality: Right;   VASECTOMY     Patient Active Problem List   Diagnosis Date Noted   PICC (peripherally inserted central catheter) in place 04/12/2023   Physical deconditioning 04/07/2023   Hypokalemia 02/22/2023   Chronic back pain 01/30/2023   Intractable back pain 12/04/2022   Medication management 12/04/2022   Vertebral osteomyelitis (HCC) 12/04/2022   Pseudomonas aeruginosa infection 07/07/2022   Osteomyelitis of thoracic region (HCC) 07/03/2022   Osteomyelitis of lumbar spine (HCC) 05/08/2022   Midline low back pain    Numbness    Osteomyelitis (HCC)    Chronic diastolic CHF (congestive heart failure) (HCC) 04/23/2022   Chronic pain 04/23/2022   Osteomyelitis of vertebra of thoracolumbar region (HCC) 04/22/2022   Presence of retained hardware 10/17/2021   Infective myositis 08/29/2021   Discitis of thoracolumbar region 07/31/2021   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   MRSA infection    Diabetic peripheral neuropathy (HCC)    Septic discitis of lumbar region 06/11/2021   Infection of spine (HCC) 05/28/2021   Medication monitoring encounter 05/09/2021   History of COVID-19    Psoas abscess (HCC)    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   Right shoulder pain 02/14/2021   Normocytic anemia 02/14/2021   Thrombocytosis 02/14/2021   Endocarditis 02/05/2021   Vision changes 02/04/2021   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   Pressure injury of skin 02/01/2021   HTN (hypertension) 02/01/2021   Anxiety 02/01/2021   Left shoulder pain 02/01/2021    Hyponatremia 01/31/2021   Positive colorectal cancer screening using Cologuard test 12/28/2020   DOE (dyspnea on exertion) 10/20/2020   Diabetic polyneuropathy associated with type 2 diabetes mellitus (HCC) 10/09/2020   DM2 (diabetes mellitus, type 2) (HCC) 10/30/2018   Lumbar stenosis with neurogenic claudication 10/14/2017   DDD (degenerative disc disease), lumbar 02/17/2017   Transient weakness of right lower extremity 02/05/2017   Hyperlipidemia associated with type 2 diabetes mellitus (HCC) 10/24/2016   Depression, recurrent (HCC) 10/24/2016   Right knee pain 05/21/2016   DDD (degenerative disc disease), lumbosacral 05/21/2016   Herniation of nucleus pulposus of cervical intervertebral disc without myelopathy 02/29/2016   Carpal tunnel syndrome 02/08/2016   Cervical spondylosis with myelopathy 01/17/2016   Lumbar stenosis 12/13/2014    REFERRING PROVIDER: Jannifer Rodney  REFERRING DIAG: Generalized weakness.  THERAPY DIAG:  Muscle weakness (generalized)  Unsteadiness on feet  Rationale for Evaluation and Treatment: Rehabilitation  ONSET DATE: Per patient "2022."  SUBJECTIVE:   SUBJECTIVE STATEMENT: Patient reports that he feels alright today.   PERTINENT HISTORY: Please see above. PAIN:  Are you having pain? 2/10.  Legs, numb.  PRECAUTIONS: Fall.  High fall risk.  Please use gait belt and be with patient at all times.  WEIGHT  BEARING RESTRICTIONS: No  FALLS:  Has patient fallen in last 6 months? No  LIVING ENVIRONMENT: Lives with: lives with their spouse Lives in: House/apartment Stairs:  Ramp. Has following equipment at home: Dan Humphreys - 2 wheeled  OCCUPATION: Retired/Disabled.  PLOF: Independent with household mobility with device.  Using a FWW at home and also wheelchair.  PATIENT GOALS: He wants to walk independently again.  OBJECTIVE:    SENSATION: He c/o bilateral foot numbness.  POSTURE: rounded shoulders, forward head, and flexed trunk .   He stands in 30 degrees of trunk flexion and states he cannot straighten further.   LOWER EXTREMITY ROM:  His bilateral LE range of motion assessed in supine is WFL.  LOWER EXTREMITY MMT:  MMT Right eval Left eval  Hip flexion 4+/5 4+/5  Hip extension    Hip abduction 4+/5 4/5  Hip adduction    Hip internal rotation    Hip external rotation    Knee flexion 4+/5 4+/5  Knee extension 4+/5 4+/5  Ankle dorsiflexion 4+/5 4+/5  Ankle plantarflexion 4+/5 4+/5  Ankle inversion    Ankle eversion     (Blank rows = not tested)  FUNCTIONAL TESTS:  5 times sit to stand: 18 seconds Timed up and go (TUG): 38 seconds (with FWW).  DTR's:  Left Pat absent, Right 2+/4+, absent bilateral Achilles.  GAIT: The patient walks with a FWW (used gait belt).  His gait is axtaxic in nature and he walks with  stiff-legged gait pattern without knee flexion and sustained dorsiflexion.  TRANSFERS:  Sit to stand with armrest usage and supervision.  Sit to supine to sit with supervision.  TODAY'S TREATMENT:                                    05/20/23 EXERCISE LOG  Exercise Repetitions and Resistance Comments  Nustep  L5 x 16 minutes   LAQ 3# x 3 minutes    Seated hip flexion  3# x 3 minutes    Static stance on firm surface 2 x 2:45 minutes  Intermittent upper extremity support        Blank cell = exercise not performed today                                     05/14/13: EXERCISE LOG  Exercise Repetitions and Resistance Comments  Nustep  Level 5 x 15 minutes   Red hip abduction and marching  2 minutes each   Airex balance pad  With UE support x 3 minutes   Rockerboard  3 minutes with UE parallel bar support   Standing UBE in partial bilateral knee flexion. 6 minutes (3 minutes forward and 3 minutes backward).    Blank cell = exercise not performed today                                    05/08/23 EXERCISE LOG  Exercise Repetitions and Resistance Comments  Nustep  L3 x 16 minutes   LAQ 2# x  2 minutes    Seated marching  2# x 3 minutes    Seated hee/toe raises  2# x 3 minutes    Seated hip ADD isometric  2 minutes w/ 5 second hold    Blank cell =  exercise not performed today   PATIENT EDUCATION:   Patient was educated on the benefits of weightbearing interventions, bracing, and healing. He reported understanding.  HOME EXERCISE PROGRAM:  ASSESSMENT:  CLINICAL IMPRESSION: Patient was introduced to static standing with intermittent upper extremity support for improved stability with standing activities. He required minimal cueing with this intervention for upright stance to improve his center of gravity and awareness of his surroundings. However, he was limited by his familiar back pain as he was unable to achieve full upright stance. This was able to be reducing by brief seated rest breaks between sets. He reported feeling tired upon the conclusion of treatment. He continues to require skilled physical therapy to address his remaining impairments to maximize his safety and functional mobility.   OBJECTIVE IMPAIRMENTS: Abnormal gait, decreased activity tolerance, decreased balance, decreased coordination, difficulty walking, postural dysfunction, and pain.   ACTIVITY LIMITATIONS: carrying, lifting, bending, standing, squatting, stairs, bathing, dressing, and locomotion level  PARTICIPATION LIMITATIONS: meal prep, cleaning, medication management, shopping, community activity, and yard work  PERSONAL FACTORS: Time since onset of injury/illness/exacerbation and 1-2 comorbidities: multiple spinal surgeries and infection  are also affecting patient's functional outcome.   REHAB POTENTIAL: Fair plus/good minus  CLINICAL DECISION MAKING: Evolving/moderate complexity  EVALUATION COMPLEXITY: Moderate   GOALS:  LONG TERM GOALS: Target date: 06/17/23  Ind with a HEP.  Goal status: INITIAL  2.  Improve TUG with appropriate assistive device to 22 seconds  Goal status:  INITIAL  3.  5 time sit to stand performed in 13-14 seconds.  Goal status: INITIAL  4.  Walk with appropriate assistive device 250 feet exhibiting normal knee flexion.  Goal status: INITIAL  5.  Perform a reciprocating stair gait with one railing.  Goal status: INITIAL  PLAN:  PT FREQUENCY: 2x/week  PT DURATION: 6 weeks  PLANNED INTERVENTIONS: Therapeutic exercises, Therapeutic activity, Neuromuscular re-education, Balance training, Gait training, Patient/Family education, Self Care, and Stair training  PLAN FOR NEXT SESSION: Nustep, gait and balance activities.  Neuro re-education.   Granville Lewis, PT 05/20/2023, 6:07 PM

## 2023-05-21 ENCOUNTER — Ambulatory Visit: Payer: Medicare HMO | Admitting: Infectious Diseases

## 2023-05-24 ENCOUNTER — Other Ambulatory Visit (HOSPITAL_COMMUNITY): Payer: Self-pay

## 2023-05-26 ENCOUNTER — Ambulatory Visit: Payer: Medicare HMO

## 2023-05-26 ENCOUNTER — Other Ambulatory Visit: Payer: Self-pay

## 2023-05-26 ENCOUNTER — Other Ambulatory Visit (HOSPITAL_COMMUNITY): Payer: Self-pay

## 2023-05-26 DIAGNOSIS — M6281 Muscle weakness (generalized): Secondary | ICD-10-CM | POA: Diagnosis not present

## 2023-05-26 DIAGNOSIS — R2681 Unsteadiness on feet: Secondary | ICD-10-CM

## 2023-05-26 NOTE — Therapy (Signed)
OUTPATIENT PHYSICAL THERAPY LOWER EXTREMITY TREATMENT   Patient Name: John Ware MRN: [id number] DOB:Jul 17, 1966, 57 y.o., male Today's Date: 05/26/2023  END OF SESSION:  PT End of Session - 05/26/23 1432     Visit Number 5    Number of Visits 12    Date for PT Re-Evaluation 06/17/23    PT Start Time 1430    PT Stop Time 1512    PT Time Calculation (min) 42 min    Activity Tolerance Patient tolerated treatment well    Behavior During Therapy WFL for tasks assessed/performed                Past Medical History:  Diagnosis Date   Depression    Diabetes mellitus without complication (HCC)    Hypertension    Spinal stenosis    With Neurogenic Claudication   Past Surgical History:  Procedure Laterality Date   CARPAL TUNNEL RELEASE Bilateral    INCISION AND DRAINAGE ABSCESS Left 03/12/2021   Procedure: INCISION AND DRAINAGE ABSCESS;  Surgeon: [name], MD;  Location: [facility];  Service: Orthopedics;  Laterality: Left;   IR FLUORO GUIDED NEEDLE PLC ASPIRATION/INJECTION LOC  04/24/2022   IR FLUORO GUIDED NEEDLE PLC ASPIRATION/INJECTION LOC  12/05/2022   IR FLUORO GUIDED NEEDLE PLC ASPIRATION/INJECTION LOC  03/05/2023   IR FLUORO GUIDED NEEDLE PLC ASPIRATION/INJECTION LOC  03/05/2023   IR US GUIDE BX ASP/DRAIN  02/02/2021   IRRIGATION AND DEBRIDEMENT ABSCESS Left 02/08/2021   Procedure: MINOR INCISION AND DRAINAGE OF ABSCESS;  Surgeon: [name], MD;  [facility]: [facility];  Service: Orthopedics;  Laterality: Left;   IRRIGATION AND DEBRIDEMENT ABSCESS Left 02/10/2021   Procedure: IRRIGATION AND DEBRIDEMENT ABSCESS LEFT LATERAL CHEST WALL;  Surgeon: [name], MD;  Location: [facility];  Service: General;  Laterality: Left;   IRRIGATION AND DEBRIDEMENT ABSCESS Bilateral 02/20/2021   Procedure: MINOR INCISION AND DRAINAGE OF ABSCESS/WITH BILATERAL WOUND VAC PLACEMENT;  Surgeon: [name], MD;  Location: [facility];  Service: Orthopedics;  Laterality: Bilateral;   IRRIGATION  AND DEBRIDEMENT SHOULDER Bilateral 02/28/2021   Procedure: IRRIGATION AND DEBRIDEMENT SHOULDER;  Surgeon: [name], MD;  [facility]: [facility];  Service: Orthopedics;  Laterality: Bilateral;   IRRIGATION AND DEBRIDEMENT SHOULDER Left 03/06/2021   Procedure: IRRIGATION AND DEBRIDEMENT SHOULDER;  Surgeon: [name], MD;  [facility]: [facility];  Service: Orthopedics;  Laterality: Left;   IRRIGATION AND DEBRIDEMENT SHOULDER Left 03/08/2021   Procedure: IRRIGATION AND DEBRIDEMENT SHOULDER;  Surgeon: [name], MD;  [facility]: [facility];  Service: Orthopedics;  Laterality: Left;   LUMBAR LAMINECTOMY FOR EPIDURAL ABSCESS N/A 05/02/2022   Procedure: THORACIC  LAMINECTOMY AND DEBRIDEMENT  FOR EPIDURAL ABSCESS;  Surgeon: [name], MD;  Location: [facility];  Service: Neurosurgery;  Laterality: N/A;   LUMBAR LAMINECTOMY/DECOMPRESSION MICRODISCECTOMY N/A 12/13/2014   Procedure: LUMBAR THREE-FOUR, LUMBAR FOUR-FIVE, LUMBAR FIVE-SACRAL ONE LAMINECTOMY;  Surgeon: [name], MD;  Location: [facility];  Service: Neurosurgery;  Laterality: N/A;  L3-4 L4-5 L5-S1 Laminectomy   LUMBAR LAMINECTOMY/DECOMPRESSION MICRODISCECTOMY Left 06/05/2021   Procedure: Left lumbar one-two, Lumbar two-three Laminectomy;  Surgeon: [name], MD;  Location: [facility] OR;  Service: Neurosurgery;  Laterality: Left;   MENISCUS REPAIR     Right knee   MINOR APPLICATION OF WOUND VAC Left 02/20/2021   Procedure: MINOR APPLICATION OF KERLIX PACKING/REMOVAL OF WOUND VAC;  Surgeon: [name], MD;  Location: [facility];  Service: General;  Laterality: Left;   RADIOLOGY WITH ANESTHESIA N/A 02/25/2023   Procedure:  MRI WITH ANESTHESIA OF THORACIC AND LUMBAR WITH AND WITHOUT CONTRAST;  Surgeon: Radiologist, Medication, MD;  Location: [facility];  Service: Radiology;  Laterality: N/A;   RADIOLOGY WITH ANESTHESIA N/A 03/05/2023   Procedure: disc aspiration;  Surgeon: Baldemar Lenis, MD;  Location: [facility] OR;  Service: Radiology;  Laterality: N/A;    TEE WITHOUT CARDIOVERSION N/A 02/05/2021   Procedure: TRANSESOPHAGEAL ECHOCARDIOGRAM (TEE);  Surgeon: Quintella Reichert, MD;  Location: [facility];  Service: Cardiovascular;  Laterality: N/A;   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   TOTAL SHOULDER ARTHROPLASTY Right 02/10/2021   Procedure: RIGHT OPEN SHOULDER I & D, OPEN DISTAL CLAVICLE RESECTION;  Surgeon: [name], MD;  [facility]: [facility];  Service: Orthopedics;  Laterality: Right;   VASECTOMY     Patient Active Problem List   Diagnosis Date Noted   PICC (peripherally inserted central catheter) in place 04/12/2023   Physical deconditioning 04/07/2023   Hypokalemia 02/22/2023   Chronic back pain 01/30/2023   Intractable back pain 12/04/2022   Medication management 12/04/2022   Vertebral osteomyelitis (HCC) 12/04/2022   Pseudomonas aeruginosa infection 07/07/2022   Osteomyelitis of thoracic region (HCC) 07/03/2022   Osteomyelitis of lumbar spine (HCC) 05/08/2022   Midline low back pain    Numbness    Osteomyelitis (HCC)    Chronic diastolic CHF (congestive heart failure) (HCC) 04/23/2022   Chronic pain 04/23/2022   Osteomyelitis of vertebra of thoracolumbar region (HCC) 04/22/2022   Presence of retained hardware 10/17/2021   Infective myositis 08/29/2021   Discitis of thoracolumbar region 07/31/2021   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   MRSA infection    Diabetic peripheral neuropathy (HCC)    Septic discitis of lumbar region 06/11/2021   Infection of spine (HCC) 05/28/2021   Medication monitoring encounter 05/09/2021   History of COVID-19    Psoas abscess (HCC)    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   Right shoulder pain 02/14/2021   Normocytic anemia 02/14/2021   Thrombocytosis 02/14/2021   Endocarditis 02/05/2021   Vision changes 02/04/2021   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   Pressure injury of skin 02/01/2021   HTN (hypertension) 02/01/2021   Anxiety 02/01/2021   Left shoulder pain 02/01/2021    Hyponatremia 01/31/2021   Positive colorectal cancer screening using Cologuard test 12/28/2020   DOE (dyspnea on exertion) 10/20/2020   Diabetic polyneuropathy associated with type 2 diabetes mellitus (HCC) 10/09/2020   DM2 (diabetes mellitus, type 2) (HCC) 10/30/2018   Lumbar stenosis with neurogenic claudication 10/14/2017   DDD (degenerative disc disease), lumbar 02/17/2017   Transient weakness of right lower extremity 02/05/2017   Hyperlipidemia associated with type 2 diabetes mellitus (HCC) 10/24/2016   Depression, recurrent (HCC) 10/24/2016   Right knee pain 05/21/2016   DDD (degenerative disc disease), lumbosacral 05/21/2016   Herniation of nucleus pulposus of cervical intervertebral disc without myelopathy 02/29/2016   Carpal tunnel syndrome 02/08/2016   Cervical spondylosis with myelopathy 01/17/2016   Lumbar stenosis 12/13/2014    REFERRING PROVIDER: Jannifer Rodney  REFERRING DIAG: Generalized weakness.  THERAPY DIAG:  Muscle weakness (generalized)  Unsteadiness on feet  Rationale for Evaluation and Treatment: Rehabilitation  ONSET DATE: Per patient "2022."  SUBJECTIVE:   SUBJECTIVE STATEMENT: Patient denies any pain today, but does report increased fatigue in BLEs.  PERTINENT HISTORY: Please see above. PAIN:  Are you having pain? 0/10.  Legs, numb.  PRECAUTIONS: Fall.  High fall risk.  Please use gait belt and be with patient at  all times.  WEIGHT BEARING RESTRICTIONS: No  FALLS:  Has patient fallen in last 6 months? No  LIVING ENVIRONMENT: Lives with: lives with their spouse Lives in: House/apartment Stairs:  Ramp. Has following equipment at home: Dan Humphreys - 2 wheeled  OCCUPATION: Retired/Disabled.  PLOF: Independent with household mobility with device.  Using a FWW at home and also wheelchair.  PATIENT GOALS: He wants to walk independently again.  OBJECTIVE:    SENSATION: He c/o bilateral foot numbness.  POSTURE: rounded shoulders, forward  head, and flexed trunk .  He stands in 30 degrees of trunk flexion and states he cannot straighten further.   LOWER EXTREMITY ROM:  His bilateral LE range of motion assessed in supine is WFL.  LOWER EXTREMITY MMT:  MMT Right eval Left eval  Hip flexion 4+/5 4+/5  Hip extension    Hip abduction 4+/5 4/5  Hip adduction    Hip internal rotation    Hip external rotation    Knee flexion 4+/5 4+/5  Knee extension 4+/5 4+/5  Ankle dorsiflexion 4+/5 4+/5  Ankle plantarflexion 4+/5 4+/5  Ankle inversion    Ankle eversion     (Blank rows = not tested)  FUNCTIONAL TESTS:  5 times sit to stand: 18 seconds Timed up and go (TUG): 38 seconds (with FWW).  DTR's:  Left Pat absent, Right 2+/4+, absent bilateral Achilles.  GAIT: The patient walks with a FWW (used gait belt).  His gait is axtaxic in nature and he walks with  stiff-legged gait pattern without knee flexion and sustained dorsiflexion.  TRANSFERS:  Sit to stand with armrest usage and supervision.  Sit to supine to sit with supervision.  TODAY'S TREATMENT:                                    05/26/23 EXERCISE LOG  Exercise Repetitions and Resistance Comments  Nustep  L5 x 20 minutes   LAQ 4# x 2.5 minutes    Seated hip flexion  4# x 2.5 minutes    Seated hip abduction Red x 3 mins Intermittent upper extremity support   Seated hip adduction 3 mins   Seated ham curls Red x 30 reps bil    Blank cell = exercise not performed today                                     05/14/13: EXERCISE LOG  Exercise Repetitions and Resistance Comments  Nustep  Level 5 x 15 minutes   Red hip abduction and marching  2 minutes each   Airex balance pad  With UE support x 3 minutes   Rockerboard  3 minutes with UE parallel bar support   Standing UBE in partial bilateral knee flexion. 6 minutes (3 minutes forward and 3 minutes backward).    Blank cell = exercise not performed today                                   PATIENT EDUCATION:    Patient was educated on the benefits of weightbearing interventions, bracing, and healing. He reported understanding.  HOME EXERCISE PROGRAM:  ASSESSMENT:  CLINICAL IMPRESSION: Pt arrives for today's treatment session denying any pain, but does reports increased BLE fatigue today.  Due to pt's increased BLE fatigue  today's treatment session concentrated on seated exercises with pt able to tolerate increase in weight or time with multiple exercises today.  Pt denied any pain, but does report increased fatigue at completion of today's treatment session.   OBJECTIVE IMPAIRMENTS: Abnormal gait, decreased activity tolerance, decreased balance, decreased coordination, difficulty walking, postural dysfunction, and pain.   ACTIVITY LIMITATIONS: carrying, lifting, bending, standing, squatting, stairs, bathing, dressing, and locomotion level  PARTICIPATION LIMITATIONS: meal prep, cleaning, medication management, shopping, community activity, and yard work  PERSONAL FACTORS: Time since onset of injury/illness/exacerbation and 1-2 comorbidities: multiple spinal surgeries and infection  are also affecting patient's functional outcome.   REHAB POTENTIAL: Fair plus/good minus  CLINICAL DECISION MAKING: Evolving/moderate complexity  EVALUATION COMPLEXITY: Moderate   GOALS:  LONG TERM GOALS: Target date: 06/17/23  Ind with a HEP.  Goal status: INITIAL  2.  Improve TUG with appropriate assistive device to 22 seconds  Goal status: INITIAL  3.  5 time sit to stand performed in 13-14 seconds.  Goal status: INITIAL  4.  Walk with appropriate assistive device 250 feet exhibiting normal knee flexion.  Goal status: INITIAL  5.  Perform a reciprocating stair gait with one railing.  Goal status: INITIAL  PLAN:  PT FREQUENCY: 2x/week  PT DURATION: 6 weeks  PLANNED INTERVENTIONS: Therapeutic exercises, Therapeutic activity, Neuromuscular re-education, Balance training, Gait training,  Patient/Family education, Self Care, and Stair training  PLAN FOR NEXT SESSION: Nustep, gait and balance activities.  Neuro re-education.   Newman Pies, PTA 05/26/2023, 3:20 PM

## 2023-05-27 ENCOUNTER — Encounter: Payer: Self-pay | Admitting: Infectious Diseases

## 2023-05-27 ENCOUNTER — Ambulatory Visit: Payer: Medicare HMO | Admitting: Infectious Diseases

## 2023-05-27 ENCOUNTER — Other Ambulatory Visit (HOSPITAL_COMMUNITY): Payer: Self-pay

## 2023-05-27 ENCOUNTER — Other Ambulatory Visit: Payer: Self-pay

## 2023-05-27 VITALS — BP 167/103 | HR 83 | Temp 98.8°F | Resp 16 | Ht 70.0 in | Wt 189.0 lb

## 2023-05-27 DIAGNOSIS — M4625 Osteomyelitis of vertebra, thoracolumbar region: Secondary | ICD-10-CM | POA: Diagnosis not present

## 2023-05-27 DIAGNOSIS — Z5181 Encounter for therapeutic drug level monitoring: Secondary | ICD-10-CM | POA: Diagnosis not present

## 2023-05-27 MED ORDER — CIPROFLOXACIN HCL 750 MG PO TABS
750.0000 mg | ORAL_TABLET | Freq: Two times a day (BID) | ORAL | 1 refills | Status: DC
  Filled 2023-05-27: qty 60, 30d supply, fill #0

## 2023-05-27 MED ORDER — CIPROFLOXACIN HCL 750 MG PO TABS: 750.0000 mg | ORAL_TABLET | Freq: Two times a day (BID) | ORAL | 1 refills | Status: DC

## 2023-05-27 NOTE — Progress Notes (Unsigned)
Patient Active Problem List   Diagnosis Date Noted  . PICC (peripherally inserted central catheter) in place 04/12/2023  . Physical deconditioning 04/07/2023  . Hypokalemia 02/22/2023  . Chronic back pain 01/30/2023  . Intractable back pain 12/04/2022  . Medication management 12/04/2022  . Vertebral osteomyelitis (HCC) 12/04/2022  . Pseudomonas aeruginosa infection 07/07/2022  . Osteomyelitis of thoracic region (HCC) 07/03/2022  . Osteomyelitis of lumbar spine (HCC) 05/08/2022  . Midline low back pain   . Numbness   . Osteomyelitis (HCC)   . Chronic diastolic CHF (congestive heart failure) (HCC) 04/23/2022  . Chronic pain 04/23/2022  . Osteomyelitis of vertebra of thoracolumbar region (HCC) 04/22/2022  . Presence of retained hardware 10/17/2021  . Infective myositis 08/29/2021  . Discitis of thoracolumbar region 07/31/2021  . 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  . MRSA infection   . Diabetic peripheral neuropathy (HCC)   . Septic discitis of lumbar region 06/11/2021  . Infection of spine (HCC) 05/28/2021  . Medication monitoring encounter 05/09/2021  . History of COVID-19   . Psoas abscess (HCC)   . 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  . Right shoulder pain 02/14/2021  . Normocytic anemia 02/14/2021  . Thrombocytosis 02/14/2021  . Endocarditis 02/05/2021  . Vision changes 02/04/2021  . 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  . Pressure injury of skin 02/01/2021  . HTN (hypertension) 02/01/2021  . Anxiety 02/01/2021  . Left shoulder pain 02/01/2021  . Hyponatremia 01/31/2021  . Positive colorectal cancer screening using Cologuard test 12/28/2020  . DOE (dyspnea on exertion) 10/20/2020  . Diabetic polyneuropathy associated with type 2 diabetes mellitus (HCC) 10/09/2020  . DM2 (diabetes mellitus, type 2) (HCC) 10/30/2018  . Lumbar stenosis with neurogenic claudication 10/14/2017  . DDD (degenerative disc disease), lumbar 02/17/2017  . Transient weakness of right lower  extremity 02/05/2017  . Hyperlipidemia associated with type 2 diabetes mellitus (HCC) 10/24/2016  . Depression, recurrent (HCC) 10/24/2016  . Right knee pain 05/21/2016  . DDD (degenerative disc disease), lumbosacral 05/21/2016  . Herniation of nucleus pulposus of cervical intervertebral disc without myelopathy 02/29/2016  . Carpal tunnel syndrome 02/08/2016  . Cervical spondylosis with myelopathy 01/17/2016  . Lumbar stenosis 12/13/2014   Current Outpatient Medications on File Prior to Visit  Medication Sig Dispense Refill  . acetaminophen (TYLENOL) 500 MG tablet Take 1 tablet (500 mg total) by mouth every 6 (six) hours as needed. 30 tablet 0  . Baclofen 5 MG TABS Take 2 tablets (10 mg total) by mouth 3 (three) times daily. 90 tablet 2  . Baclofen 5 MG TABS Take 1 tablet (5 mg total) by mouth 3 (three) times daily. 90 tablet 2  . cefadroxil (DURICEF) 500 MG capsule Take 1 capsule (500 mg total) by mouth 2 (two) times daily. 120 capsule 1  . ciprofloxacin (CIPRO) 750 MG tablet Take 1 tablet (750 mg total) by mouth 2 (two) times daily. 60 tablet 1  . Continuous Glucose Sensor (FREESTYLE LIBRE 2 SENSOR) MISC CHECK BLOOD SUGAR 4 TIMES A DAY 1 each 2  . Gabapentin, Once-Daily, 300 MG TABS Take 1 tablet (300 mg total) by mouth daily. 30 tablet 2  . hydrOXYzine (ATARAX) 10 MG tablet Take 1 tablet (10 mg total) by mouth 3 (three) times daily. 90 tablet 2  . morphine (KADIAN) 30 MG 24 hr capsule Take 1 capsule (30 mg total) by mouth every 12 (twelve) hours. 60 capsule 0  . morphine (MS CONTIN) 30 MG 12 hr tablet Take 1 tablet (30 mg total)  by mouth every 12 (twelve) hours. 60 tablet 0  . naloxone (NARCAN) 0.4 MG/ML injection Inject 1 mL (0.4 mg total) into the vein as needed. 1 mL 0  . naloxone (NARCAN) nasal spray 4 mg/0.1 mL Use 1 spray in one nostril for single dose as needed. May repeat every 2-3 minutes until patient responsive or EMS arrives. 1 each 1  . oxyCODONE-acetaminophen (PERCOCET)  10-325 MG tablet Take 1 tablet by mouth 5 times daily as needed. 95 tablet 0  . oxyCODONE-acetaminophen (PERCOCET) 10-325 MG tablet Take 1 tablet by mouth five times a day 150 tablet 0  . oxyCODONE-acetaminophen (PERCOCET) 10-325 MG tablet Take 1 tablet by mouth 5 (five) times daily. 15 tablet 0  . oxyCODONE-acetaminophen (PERCOCET) 10-325 MG tablet Take 1 tablet by mouth 5 (five) times daily. 150 tablet 0   No current facility-administered medications on file prior to visit.   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   Admitted on 04/22/2022 with inability to walk, BLE numbness, intermittent fevers and urinary incontinence x3 weeks.  He was found to have acute discitis T9/10-T11/T12 with epidural phlegmon/abscess, septic arthritis T11-T12, extensive enhancement paraspinal soft tissue, as well as soft tissue abscess. 8/30 S/p fluoroscopy guided T1-11 disc aspiration ( Cx PsA), 8/31 aspiration of left back superficial abscess ( Cx PsA). Blood cx 8/28 1/2 sets staph pseudintermedius/epidermidis. This was followed by I&D of subcut abscess with decompression and debridement of osteomyelitis via T10-T11 laminectomy on 09/07 by [name] ( OR cx NG). Seen by ID and antibiotics switched to cefepime and planned for 6 weeks course EOT 10/19 with plan to continue Ciprofloxacin  thereafter. Patient was also continued on doxycycline for hardware associated lumbar osteomyelitis. Patient was discharged to CIR due to functional decline on 9/12 and then discharged home from rehab on 9/22.   Res-started on ciprofloxacin 750mg  po bid on 11/8 due to concerns of persistent infection in the setting of elevated inflammatory markers and continued to take PO doxycycline as is without any concerns.    Admitted 4/9-4/15/24 with intractable back pain. 4/9 MRI L spine with discitis, osteomyelitis in lower T and Lumbar spine, epidural phlegmon. 5.9 x 2.0 x 2.0 cm fluid collection spanning the L4-L5 interspinous space, at the site of prior laminectomy, which could reflect a postoperative seroma, although abscess is not excluded.This is unchanged compared to 04/22/22. S/p IR aspiration 4/11,  cx grew MSSE. Discharged on cefazolin for 6 weeks. EOT 6/6 and transitioned to cefadroxil.   Admitted 6/29-7/11/24 for intractable back pain to get MRI.  MRI had to be performed with anesthesia on 7/2.  MRI findings with discitis/osteomyelitis of the lower T-spine progressed, phlegmon surrounding the thecal sac resulting in moderate narrowing of T10-T11 and severe at T11-T12 resulting in mass effect of the cord without cord signal abnormality, interval resolution of the ventral epidural fluid collection at the L1 level. Per neurosurgery surgical intervention would be high risk and recommended conservative management.  S/p CT-guided aspiration  on 7/10 under anesthesia, cultures with no growth.  Seen by ID and was discharged on IV cefepime for a total of 8 weeks, EOT 04/23/2023  04/08/23 Accompanied by wife and daughter.  He is getting IV cefepime through his right arm PICC without any concerns related to antibiotics or PICC. Reports significant improvement in back pain which is 7/10 compared to when he could hardly breathe when had back pain.  He really thinks this antibiotic is working for him.  Reports he is unable  to walk or stand without support due to imbalance although does not think his lower extremities are weak.  He is also doing upper and lower extremity exercises at home.  Completed IV cefepime on 8/28 after which he was started on ciprofloxacin 750 mg po bid and cefadroxil 500mg  po bid which he has been taking so far without missing doses. Back pain is 2/10. He is working with PT. He is still unable to walk and stand independently.   Review of Systems: all systems reviewed with pertinent positives and negatives as listed above   Past Medical History:  Diagnosis Date  . Depression   . Diabetes mellitus without complication (HCC)   . Hypertension   . Spinal stenosis    With Neurogenic Claudication   Past Surgical History:  Procedure Laterality Date  . CARPAL TUNNEL RELEASE Bilateral   . INCISION AND DRAINAGE ABSCESS Left 03/12/2021   Procedure: INCISION AND DRAINAGE ABSCESS;  Surgeon: [name], MD;  Location: [facility];  Service: Orthopedics;  Laterality: Left;  . IR FLUORO GUIDED NEEDLE PLC ASPIRATION/INJECTION LOC  04/24/2022  . IR FLUORO GUIDED NEEDLE PLC ASPIRATION/INJECTION LOC  12/05/2022  . IR FLUORO GUIDED NEEDLE PLC ASPIRATION/INJECTION LOC  03/05/2023  . IR FLUORO GUIDED NEEDLE PLC ASPIRATION/INJECTION LOC  03/05/2023  . IR US GUIDE BX ASP/DRAIN  02/02/2021  . IRRIGATION AND DEBRIDEMENT ABSCESS Left 02/08/2021   Procedure: MINOR INCISION AND DRAINAGE OF ABSCESS;  Surgeon: [name], MD;  Location: [facility];  Service: Orthopedics;  Laterality: Left;  . IRRIGATION AND DEBRIDEMENT ABSCESS Left 02/10/2021   Procedure: IRRIGATION AND DEBRIDEMENT ABSCESS LEFT LATERAL CHEST WALL;  Surgeon: [name], MD;  Location: [facility];  Service: General;  Laterality: Left;  . IRRIGATION AND DEBRIDEMENT ABSCESS Bilateral 02/20/2021   Procedure: MINOR INCISION AND DRAINAGE OF ABSCESS/WITH BILATERAL WOUND VAC PLACEMENT;  Surgeon: [name], MD;  Location: [facility];  Service: Orthopedics;  Laterality:  Bilateral;  . IRRIGATION AND DEBRIDEMENT SHOULDER Bilateral 02/28/2021   Procedure: IRRIGATION AND DEBRIDEMENT SHOULDER;  Surgeon: [name], MD;  Location: [facility];  Service: Orthopedics;  Laterality: Bilateral;  . IRRIGATION AND DEBRIDEMENT SHOULDER Left 03/06/2021   Procedure: IRRIGATION AND DEBRIDEMENT SHOULDER;  Surgeon: [name], MD;  Location: [facility];  Service: Orthopedics;  Laterality: Left;  . IRRIGATION AND DEBRIDEMENT SHOULDER Left 03/08/2021   Procedure: IRRIGATION AND DEBRIDEMENT SHOULDER;  Surgeon: [name], MD;  Location: [facility];  Service: Orthopedics;  Laterality: Left;  . LUMBAR LAMINECTOMY FOR EPIDURAL ABSCESS N/A 05/02/2022   Procedure: THORACIC  LAMINECTOMY AND DEBRIDEMENT  FOR EPIDURAL ABSCESS;  Surgeon: [name], MD;  Location: [facility];  Service: Neurosurgery;  Laterality: N/A;  . LUMBAR LAMINECTOMY/DECOMPRESSION MICRODISCECTOMY N/A 12/13/2014   Procedure: LUMBAR THREE-FOUR, LUMBAR FOUR-FIVE, LUMBAR FIVE-SACRAL ONE LAMINECTOMY;  Surgeon: [name], MD;  Location: [facility];  Service: Neurosurgery;  Laterality: N/A;  L3-4 L4-5 L5-S1 Laminectomy  . LUMBAR LAMINECTOMY/DECOMPRESSION MICRODISCECTOMY Left 06/05/2021   Procedure: Left lumbar one-two,  Lumbar two-three Laminectomy;  Surgeon: [name], MD;  Location: [facility] OR;  Service: Neurosurgery;  Laterality: Left;  . MENISCUS REPAIR     Right knee  . MINOR APPLICATION OF WOUND VAC Left 02/20/2021   Procedure: MINOR APPLICATION OF KERLIX PACKING/REMOVAL OF WOUND VAC;  Surgeon: [name], MD;  Location: [facility];  Service: General;  Laterality: Left;  . RADIOLOGY WITH ANESTHESIA N/A 02/25/2023   Procedure: MRI WITH ANESTHESIA OF THORACIC AND LUMBAR WITH AND WITHOUT CONTRAST;  Surgeon: Radiologist, Medication, MD;  Location: [facility];  Service: Radiology;  Laterality: N/A;  . RADIOLOGY WITH ANESTHESIA N/A 03/05/2023   Procedure: disc aspiration;  Surgeon: Baldemar Lenis, MD;  Location: [facility] OR;   Service: Radiology;  Laterality: N/A;  . TEE WITHOUT CARDIOVERSION N/A 02/05/2021   Procedure: TRANSESOPHAGEAL ECHOCARDIOGRAM (TEE);  Surgeon: Quintella Reichert, MD;  [facility];  Service: Cardiovascular;  Laterality: N/A;  . 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 TOTAL SHOULDER ARTHROPLASTY Right 02/10/2021   Procedure: RIGHT OPEN SHOULDER I & D, OPEN DISTAL CLAVICLE RESECTION;  Surgeon: [name], MD;  Location: [facility];  Service: Orthopedics;  Laterality: Right;  Marland Kitchen VASECTOMY      Social History   Tobacco Use  . Smoking status: Former    Current packs/day: 0.00    Average packs/day: 2.0 packs/day for 15.0 years (30.0 ttl pk-yrs)    Types: Cigarettes    Start date: 66    Quit date: 2012    Years since quitting: 12.7    Passive exposure: Never  . Smokeless tobacco: Never  Vaping Use  . Vaping status: Never Used  Substance Use Topics  . Alcohol use: Yes    Comment: occasional  . Drug use: No    Family History  Problem Relation Age of Onset  . Cancer Mother   . Colon cancer Neg Hx     Allergies  Allergen Reactions  . Tizanidine Other (See Comments)    Leg cramps    Health Maintenance  Topic Date Due  . Medicare Annual Wellness (AWV)  Never done  . Zoster Vaccines- Shingrix (1 of 2) Never done  . COVID-19 Vaccine (3 - Moderna risk series) 02/14/2020  . FOOT EXAM  11/17/2022  . OPHTHALMOLOGY EXAM  11/17/2022  . INFLUENZA VACCINE  11/24/2023 (Originally 03/27/2023)  . Diabetic kidney evaluation - Urine ACR  08/17/2023  . HEMOGLOBIN A1C  08/25/2023  . Fecal DNA (Cologuard)  11/03/2023  . Lung Cancer Screening  12/03/2023  . Diabetic kidney evaluation - eGFR measurement  03/05/2024  . DTaP/Tdap/Td (2 - Td or Tdap) 08/14/2024  . Hepatitis C Screening  Completed  . HIV Screening  Completed  . HPV VACCINES  Aged Out    Objective: Resp 16   Ht 5\' 10"  (1.778 m)   BMI 27.00 kg/m     Physical Exam Constitutional:  older than stated  age, malnourished   HENT:     Head: Normocephalic and atraumatic.      Mouth: Mucous membranes are moist.  Eyes:    Conjunctiva/sclera: Conjunctivae normal.     Pupils:   Cardiovascular:     Rate and Rhythm: Normal rate and regular rhythm.     Heart sounds:  Pulmonary:     Effort: Pulmonary effort is normal.     Breath sounds:   Abdominal:     General: Non distended     Palpations:   Musculoskeletal:  General: sitting in a wheel chair and needs support to stand.  Power of Rt LE 4 to 4+/5 Power of Lt LE 4 to 4+/5  Skin:    General: Skin is warm and dry.     Comments: RT arm PICC with no concerns   Neurological:     General: grossly non focal     Mental Status: awake, alert and oriented to person, place, and time.   Psychiatric:        Mood and Affect: Mood normal.   Lab Results Lab Results  Component Value Date   WBC 6.9 03/05/2023   HGB 12.4 (L) 03/05/2023   HCT 38.0 (L) 03/05/2023   MCV 83.7 03/05/2023   PLT 426 (H) 03/05/2023    Lab Results  Component Value Date   CREATININE 0.74 03/06/2023   BUN 9 03/06/2023   NA 136 03/06/2023   K 4.1 03/06/2023   CL 100 03/06/2023   CO2 28 03/06/2023    Lab Results  Component Value Date   ALT 15 02/23/2023   AST 17 02/23/2023   ALKPHOS 85 02/23/2023   BILITOT 0.9 02/23/2023    Lab Results  Component Value Date   CHOL 146 11/08/2021   HDL 43 11/08/2021   LDLCALC 85 11/08/2021   TRIG 97 11/08/2021   CHOLHDL 3.4 11/08/2021   No results found for: "LABRPR", "RPRTITER" No results found for: "HIV1RNAQUANT", "HIV1RNAVL", "CD4TABS"   Microbiology  Results for orders placed or performed during the hospital encounter of 02/22/23  Culture, blood (Routine X 2) w Reflex to ID Panel     Status: None   Collection Time: 02/23/23  7:38 AM   Specimen: BLOOD  Result Value Ref Range Status   Specimen Description BLOOD SITE NOT SPECIFIED  Final   Special Requests   Final    BOTTLES DRAWN AEROBIC AND ANAEROBIC Blood  Culture adequate volume   Culture   Final    NO GROWTH 5 DAYS Performed at [facility], [address] [place], [place] [address]    Report Status 02/28/2023 FINAL  Final  Culture, blood (Routine X 2) w Reflex to ID Panel     Status: None   Collection Time: 02/23/23  7:40 AM   Specimen: BLOOD  Result Value Ref Range Status   Specimen Description BLOOD SITE NOT SPECIFIED  Final   Special Requests   Final    BOTTLES DRAWN AEROBIC AND ANAEROBIC Blood Culture adequate volume   Culture   Final    NO GROWTH 5 DAYS Performed at [facility], [address], [place], [place] [address]    Report Status 02/28/2023 FINAL  Final  Aerobic/Anaerobic Culture w Gram Stain (surgical/deep wound)     Status: None   Collection Time: 03/05/23 10:04 AM   Specimen: Fine Needle Aspirate  Result Value Ref Range Status   Specimen Description NEEDLE ASPIRATE  Final   Special Requests NONE  Final   Gram Stain   Final    FEW WBC PRESENT, PREDOMINANTLY PMN NO ORGANISMS SEEN    Culture   Final    No growth aerobically or anaerobically. Performed at [facility], [address], [place], [place] [address]    Report Status 03/10/2023 FINAL  Final  Aerobic/Anaerobic Culture w Gram Stain (surgical/deep wound)     Status: None   Collection Time: 03/05/23 10:07 AM   Specimen: Fine Needle Aspirate  Result Value Ref Range Status   Specimen Description NEEDLE  ASPIRATE  Final   Special Requests  IDISC  Final   Gram Stain   Final    RARE WBC PRESENT, PREDOMINANTLY PMN NO ORGANISMS SEEN    Culture   Final    No growth aerobically or anaerobically. Performed at [facility], [address] [place], [place] [address]    Report Status 03/10/2023 FINAL  Final  Acid Fast Smear (AFB)     Status: None   Collection Time: 03/05/23 10:09 AM   Specimen: Intervertebral Disc; Fine Needle Aspirate  Result Value Ref Range Status   AFB Specimen Processing Concentration  Final   Acid Fast Smear  Negative  Final    Comment: (NOTE) Performed At: [facility] [address] [place], [place] [id number] Jolene Schimke MD OZ:[id number]    Source (AFB) NEEDLE ASPIRATE  Final    Comment: Performed at [facility], [address] [place], [place] [address]  Acid Fast Culture with reflexed sensitivities     Status: None   Collection Time: 03/05/23 10:09 AM   Specimen: Intervertebral Disc; Fine Needle Aspirate  Result Value Ref Range Status   Acid Fast Culture Negative  Final    Comment: (NOTE) No acid fast bacilli isolated after 6 weeks. Performed At: [facility] [address] [place], [place] [id number] Jolene Schimke MD UU:[id number]    Source of Sample NEEDLE ASPIRATE  Final    Comment: Performed at [facility], [address], [place], [place] [address]  Fungus Culture With Stain     Status: None   Collection Time: 03/05/23 10:09 AM   Specimen: Intervertebral Disc  Result Value Ref Range Status   Fungus Stain Final report  Final   Fungus (Mycology) Culture Final report  Final    Comment: (NOTE) Performed At: [facility] [address] [place], [place] [id number] Jolene Schimke MD PP:[id number]    Fungal Source NEEDLE ASPIRATE  Final    Comment: Performed at [facility], [address] [place], [place] [address]  Fungus Culture Result     Status: None   Collection Time: 03/05/23 10:09 AM  Result Value Ref Range Status   Result 1 Comment  Final    Comment: (NOTE) KOH/Calcofluor preparation:  no fungus observed. Performed At: [facility] [address] [place], [place] [id number] Jolene Schimke MD TD:[id number]   Fungal organism reflex     Status: None   Collection Time: 03/05/23 10:09 AM  Result Value Ref Range Status   Fungal result 1 Comment  Final    Comment: (NOTE) No yeast or mold isolated after 4 weeks. Performed At: [facility] [address] [place], [place] [id number] Jolene Schimke MD  DV:[id number]   Fungus Culture With Stain     Status: None   Collection Time: 03/05/23 12:25 PM  Result Value Ref Range Status   Fungus Stain Final report  Final   Fungus (Mycology) Culture Final report  Final    Comment: (NOTE) Performed At: [facility] [address] [place], [place] [id number] Jolene Schimke MD [id number]    Fungal Source NEEDLE ASPIRATE  Final    Comment: Performed at [facility], [address] [place], [place] [address]  Acid Fast Smear (AFB)     Status: None   Collection Time: 03/05/23 12:25 PM   Specimen: Intervertebral Disc; Fine Needle Aspirate  Result Value Ref Range Status   AFB Specimen Processing Concentration  Final   Acid Fast Smear  Negative  Final    Comment: (NOTE) Performed At: [facility] [address] [place], [place] [id number] Jolene Schimke MD YQ:[id number]    Source (AFB) NEEDLE ASPIRATE  Final    Comment: Performed at [facility], [address] [place], [place] [address]  Acid Fast Culture with reflexed sensitivities     Status: None   Collection Time: 03/05/23 12:25 PM   Specimen: Intervertebral Disc; Fine Needle Aspirate  Result Value Ref Range Status   Acid Fast Culture Negative  Final    Comment: (NOTE) No acid fast bacilli isolated after 6 weeks. Performed At: [facility] [address] [place], [place] [id number] Jolene Schimke MD OZ:[id number]    Source of Sample NEEDLE ASPIRATE  Final    Comment: Performed at [facility], [address], [place], [place] [address]  Fungus Culture Result     Status: None   Collection Time: 03/05/23 12:25 PM  Result Value Ref Range Status   Result 1 Comment  Final    Comment: (NOTE) KOH/Calcofluor preparation:  no fungus observed. Performed At: [facility] [address] [place], [place] [id number] Jolene Schimke MD RJ:[id number]   Fungal organism reflex     Status: None   Collection Time: 03/05/23 12:25 PM  Result Value  Ref Range Status   Fungal result 1 Comment  Final    Comment: (NOTE) No yeast or mold isolated after 4 weeks. Performed At: [facility] [address] [place], [place] [id number] Jolene Schimke MD TF:[id number]    Imaging MRI L spine 02/25/23 IMPRESSION: 1. Findings consistent with discitis/osteomyelitis of the lower thoracic spine, progressed since prior thoracic MRI (September 2023). The T11-12 level, which was included on prior MRI of the lumbar spine (May 2024) appear stable. 2. Phlegmon surrounding the thecal sac results in moderate narrowing at T10-11 and severe at T11-12, resulting in mass effect on the cord without cord signal abnormality. 3. Interval resolution of ventral epidural fluid collection at the L1 level. 4. Degenerative changes of the thoracic spine with moderate spinal canal stenosis at T5-6. 5. Degenerative changes of the lumbar spine with moderate bilateral neural foraminal narrowing at L1-2 and L2-3 and moderate left neural foraminal narrowing at L5-S1.   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 Will possibly switch him to an oral equivalent of cefepime after 8 weeks course ( like ciprofloxacin + cefadroxil)  as this has had significant improvement in his back pain, normalization of inflammatory markers, which also covers his prior organisms isolated in the past ( MSSE, PsA and MSSA) except MRSA. He has not been taking PO doxycycline since his last admission and do not think we need to continue this further with no recent cx positive with MRSA  - Fu in 2 weeks prior to EOT, plan to switch to PO abtx at that time   # Medication Monitoring  - 8/12 CBC, BMP unremarkable. Normal ESR and CRP   # PICC  - no concerns   I have personally spent 55 minutes involved in face-to-face and non-face-to-face activities for this patient on the day of the visit.  Professional time spent includes the following activities: Preparing to see the patient (review of tests), Obtaining and/or reviewing separately obtained history (admission/discharge record), Performing a medically appropriate examination and/or evaluation , Ordering medications/tests/procedures, referring and communicating with other health care professionals, Documenting clinical information in  the EMR, Independently interpreting results (not separately reported), Communicating results to the patient/family/caregiver, Counseling and educating the patient/family/caregiver and Care coordination (not separately reported).   Victoriano Lain, MD [facility] 05/27/2023, 2:42 PM

## 2023-05-28 LAB — BASIC METABOLIC PANEL
BUN: 12 mg/dL (ref 7–25)
CO2: 25 mmol/L (ref 20–32)
Calcium: 9.9 mg/dL (ref 8.6–10.3)
Chloride: 97 mmol/L — ABNORMAL LOW (ref 98–110)
Creat: 0.7 mg/dL (ref 0.70–1.30)
Glucose, Bld: 132 mg/dL — ABNORMAL HIGH (ref 65–99)
Potassium: 4.4 mmol/L (ref 3.5–5.3)
Sodium: 138 mmol/L (ref 135–146)

## 2023-05-28 LAB — CBC
HCT: 42.2 % (ref 38.5–50.0)
Hemoglobin: 14.3 g/dL (ref 13.2–17.1)
MCH: 27.9 pg (ref 27.0–33.0)
MCHC: 33.9 g/dL (ref 32.0–36.0)
MCV: 82.3 fL (ref 80.0–100.0)
MPV: 10.1 fL (ref 7.5–12.5)
Platelets: 319 10*3/uL (ref 140–400)
RBC: 5.13 10*6/uL (ref 4.20–5.80)
RDW: 13.5 % (ref 11.0–15.0)
WBC: 6.7 10*3/uL (ref 3.8–10.8)

## 2023-05-29 ENCOUNTER — Other Ambulatory Visit (HOSPITAL_COMMUNITY): Payer: Self-pay

## 2023-05-29 MED ORDER — OXYCODONE-ACETAMINOPHEN 10-325 MG PO TABS
1.0000 | ORAL_TABLET | Freq: Every day | ORAL | 0 refills | Status: DC
  Filled 2023-05-29: qty 150, 30d supply, fill #0

## 2023-05-29 MED ORDER — MORPHINE SULFATE ER 30 MG PO CP24
30.0000 mg | ORAL_CAPSULE | Freq: Two times a day (BID) | ORAL | 0 refills | Status: DC
  Filled 2023-05-29: qty 46, 23d supply, fill #0
  Filled 2023-05-29: qty 14, 7d supply, fill #0

## 2023-05-29 MED ORDER — GABAPENTIN 300 MG PO CAPS
300.0000 mg | ORAL_CAPSULE | Freq: Two times a day (BID) | ORAL | 2 refills | Status: DC
  Filled 2023-05-29: qty 60, 30d supply, fill #0
  Filled 2023-08-19 – 2023-09-15 (×3): qty 60, 30d supply, fill #1
  Filled 2024-03-03: qty 60, 30d supply, fill #2

## 2023-05-30 ENCOUNTER — Other Ambulatory Visit (HOSPITAL_COMMUNITY): Payer: Self-pay

## 2023-06-02 ENCOUNTER — Other Ambulatory Visit (HOSPITAL_COMMUNITY): Payer: Self-pay

## 2023-06-02 MED ORDER — BUPRENORPHINE 7.5 MCG/HR TD PTWK
MEDICATED_PATCH | TRANSDERMAL | 0 refills | Status: DC
  Filled 2023-06-02: qty 4, 28d supply, fill #0

## 2023-06-03 ENCOUNTER — Other Ambulatory Visit (HOSPITAL_COMMUNITY): Payer: Self-pay

## 2023-06-03 ENCOUNTER — Ambulatory Visit: Payer: Medicare HMO | Attending: Family

## 2023-06-03 DIAGNOSIS — R2681 Unsteadiness on feet: Secondary | ICD-10-CM | POA: Diagnosis present

## 2023-06-03 DIAGNOSIS — M6281 Muscle weakness (generalized): Secondary | ICD-10-CM | POA: Diagnosis present

## 2023-06-03 MED ORDER — MORPHINE SULFATE ER 30 MG PO TBCR
EXTENDED_RELEASE_TABLET | Freq: Two times a day (BID) | ORAL | 0 refills | Status: DC
Start: 2023-06-03 — End: 2023-06-26
  Filled 2023-06-03: qty 60, 30d supply, fill #0

## 2023-06-03 NOTE — Therapy (Signed)
OUTPATIENT PHYSICAL THERAPY LOWER EXTREMITY TREATMENT   Patient Name: John Ware MRN: [id number] DOB:1965/09/16, 57 y.o., male Today's Date: 06/03/2023  END OF SESSION:  PT End of Session - 06/03/23 1547     Visit Number 6    Number of Visits 12    Date for PT Re-Evaluation 06/17/23    PT Start Time 1545    PT Stop Time 1630    PT Time Calculation (min) 45 min    Activity Tolerance Patient tolerated treatment well    Behavior During Therapy WFL for tasks assessed/performed                 Past Medical History:  Diagnosis Date   Depression    Diabetes mellitus without complication (HCC)    Hypertension    Spinal stenosis    With Neurogenic Claudication   Past Surgical History:  Procedure Laterality Date   CARPAL TUNNEL RELEASE Bilateral    INCISION AND DRAINAGE ABSCESS Left 03/12/2021   Procedure: INCISION AND DRAINAGE ABSCESS;  Surgeon: [name], MD;  Location: [facility];  Service: Orthopedics;  Laterality: Left;   IR FLUORO GUIDED NEEDLE PLC ASPIRATION/INJECTION LOC  04/24/2022   IR FLUORO GUIDED NEEDLE PLC ASPIRATION/INJECTION LOC  12/05/2022   IR FLUORO GUIDED NEEDLE PLC ASPIRATION/INJECTION LOC  03/05/2023   IR FLUORO GUIDED NEEDLE PLC ASPIRATION/INJECTION LOC  03/05/2023   IR US GUIDE BX ASP/DRAIN  02/02/2021   IRRIGATION AND DEBRIDEMENT ABSCESS Left 02/08/2021   Procedure: MINOR INCISION AND DRAINAGE OF ABSCESS;  Surgeon: [name], MD;  Location: [facility];  Service: Orthopedics;  Laterality: Left;   IRRIGATION AND DEBRIDEMENT ABSCESS Left 02/10/2021   Procedure: IRRIGATION AND DEBRIDEMENT ABSCESS LEFT LATERAL CHEST WALL;  Surgeon: [name], MD;  Location: [facility];  Service: General;  Laterality: Left;   IRRIGATION AND DEBRIDEMENT ABSCESS Bilateral 02/20/2021   Procedure: MINOR INCISION AND DRAINAGE OF ABSCESS/WITH BILATERAL WOUND VAC PLACEMENT;  Surgeon: [name], MD;  Location: [facility];  Service: Orthopedics;  Laterality: Bilateral;   IRRIGATION  AND DEBRIDEMENT SHOULDER Bilateral 02/28/2021   Procedure: IRRIGATION AND DEBRIDEMENT SHOULDER;  Surgeon: [name], MD;  Location: [facility];  Service: Orthopedics;  Laterality: Bilateral;   IRRIGATION AND DEBRIDEMENT SHOULDER Left 03/06/2021   Procedure: IRRIGATION AND DEBRIDEMENT SHOULDER;  Surgeon: [name], MD;  Location: [facility];  Service: Orthopedics;  Laterality: Left;   IRRIGATION AND DEBRIDEMENT SHOULDER Left 03/08/2021   Procedure: IRRIGATION AND DEBRIDEMENT SHOULDER;  Surgeon: [name], MD;  Location: [facility];  Service: Orthopedics;  Laterality: Left;   LUMBAR LAMINECTOMY FOR EPIDURAL ABSCESS N/A 05/02/2022   Procedure: THORACIC  LAMINECTOMY AND DEBRIDEMENT  FOR EPIDURAL ABSCESS;  Surgeon: [name], MD;  Location: [facility];  Service: Neurosurgery;  Laterality: N/A;   LUMBAR LAMINECTOMY/DECOMPRESSION MICRODISCECTOMY N/A 12/13/2014   Procedure: LUMBAR THREE-FOUR, LUMBAR FOUR-FIVE, LUMBAR FIVE-SACRAL ONE LAMINECTOMY;  Surgeon: [name], MD;  Location: [facility];  Service: Neurosurgery;  Laterality: N/A;  L3-4 L4-5 L5-S1 Laminectomy   LUMBAR LAMINECTOMY/DECOMPRESSION MICRODISCECTOMY Left 06/05/2021   Procedure: Left lumbar one-two, Lumbar two-three Laminectomy;  Surgeon: [name], MD;  [facility];  Service: Neurosurgery;  Laterality: Left;   MENISCUS REPAIR     Right knee   MINOR APPLICATION OF WOUND VAC Left 02/20/2021   Procedure: MINOR APPLICATION OF KERLIX PACKING/REMOVAL OF WOUND VAC;  Surgeon: [name], MD;  Location: [facility];  Service: General;  Laterality: Left;   RADIOLOGY WITH ANESTHESIA N/A 02/25/2023  Procedure: MRI WITH ANESTHESIA OF THORACIC AND LUMBAR WITH AND WITHOUT CONTRAST;  Surgeon: Radiologist, Medication, MD;  Location: [facility];  Service: Radiology;  Laterality: N/A;   RADIOLOGY WITH ANESTHESIA N/A 03/05/2023   Procedure: disc aspiration;  Surgeon: Baldemar Lenis, MD;  Location: [facility] OR;  Service: Radiology;  Laterality: N/A;    TEE WITHOUT CARDIOVERSION N/A 02/05/2021   Procedure: TRANSESOPHAGEAL ECHOCARDIOGRAM (TEE);  Surgeon: Quintella Reichert, MD;  Location: [facility];  Service: Cardiovascular;  Laterality: N/A;   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   TOTAL SHOULDER ARTHROPLASTY Right 02/10/2021   Procedure: RIGHT OPEN SHOULDER I & D, OPEN DISTAL CLAVICLE RESECTION;  Surgeon: [name], MD;  Location: [facility];  Service: Orthopedics;  Laterality: Right;   VASECTOMY     Patient Active Problem List   Diagnosis Date Noted   PICC (peripherally inserted central catheter) in place 04/12/2023   Physical deconditioning 04/07/2023   Hypokalemia 02/22/2023   Chronic back pain 01/30/2023   Intractable back pain 12/04/2022   Medication management 12/04/2022   Vertebral osteomyelitis (HCC) 12/04/2022   Pseudomonas aeruginosa infection 07/07/2022   Osteomyelitis of thoracic region (HCC) 07/03/2022   Osteomyelitis of lumbar spine (HCC) 05/08/2022   Midline low back pain    Numbness    Osteomyelitis (HCC)    Chronic diastolic CHF (congestive heart failure) (HCC) 04/23/2022   Chronic pain 04/23/2022   Osteomyelitis of vertebra of thoracolumbar region (HCC) 04/22/2022   Presence of retained hardware 10/17/2021   Infective myositis 08/29/2021   Discitis of thoracolumbar region 07/31/2021   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   MRSA infection    Diabetic peripheral neuropathy (HCC)    Septic discitis of lumbar region 06/11/2021   Infection of spine (HCC) 05/28/2021   Medication monitoring encounter 05/09/2021   History of COVID-19    Psoas abscess (HCC)    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   Right shoulder pain 02/14/2021   Normocytic anemia 02/14/2021   Thrombocytosis 02/14/2021   Endocarditis 02/05/2021   Vision changes 02/04/2021   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   Pressure injury of skin 02/01/2021   HTN (hypertension) 02/01/2021   Anxiety 02/01/2021   Left shoulder pain 02/01/2021    Hyponatremia 01/31/2021   Positive colorectal cancer screening using Cologuard test 12/28/2020   DOE (dyspnea on exertion) 10/20/2020   Diabetic polyneuropathy associated with type 2 diabetes mellitus (HCC) 10/09/2020   DM2 (diabetes mellitus, type 2) (HCC) 10/30/2018   Lumbar stenosis with neurogenic claudication 10/14/2017   DDD (degenerative disc disease), lumbar 02/17/2017   Transient weakness of right lower extremity 02/05/2017   Hyperlipidemia associated with type 2 diabetes mellitus (HCC) 10/24/2016   Depression, recurrent (HCC) 10/24/2016   Right knee pain 05/21/2016   DDD (degenerative disc disease), lumbosacral 05/21/2016   Herniation of nucleus pulposus of cervical intervertebral disc without myelopathy 02/29/2016   Carpal tunnel syndrome 02/08/2016   Cervical spondylosis with myelopathy 01/17/2016   Lumbar stenosis 12/13/2014    REFERRING PROVIDER: Jannifer Rodney  REFERRING DIAG: Generalized weakness.  THERAPY DIAG:  Muscle weakness (generalized)  Unsteadiness on feet  Rationale for Evaluation and Treatment: Rehabilitation  ONSET DATE: Per patient "2022."  SUBJECTIVE:   SUBJECTIVE STATEMENT: Patient reports that he worked too hard last time and he was really tired and stiff.   PERTINENT HISTORY: Please see above. PAIN:  Are you having pain? 0/10.  Legs, numb.  PRECAUTIONS: Fall.  High fall risk.  Please use gait  belt and be with patient at all times.  WEIGHT BEARING RESTRICTIONS: No  FALLS:  Has patient fallen in last 6 months? No  LIVING ENVIRONMENT: Lives with: lives with their spouse Lives in: House/apartment Stairs:  Ramp. Has following equipment at home: Dan Humphreys - 2 wheeled  OCCUPATION: Retired/Disabled.  PLOF: Independent with household mobility with device.  Using a FWW at home and also wheelchair.  PATIENT GOALS: He wants to walk independently again.  OBJECTIVE:    SENSATION: He c/o bilateral foot numbness.  POSTURE: rounded  shoulders, forward head, and flexed trunk .  He stands in 30 degrees of trunk flexion and states he cannot straighten further.   LOWER EXTREMITY ROM:  His bilateral LE range of motion assessed in supine is WFL.  LOWER EXTREMITY MMT:  MMT Right eval Left eval  Hip flexion 4+/5 4+/5  Hip extension    Hip abduction 4+/5 4/5  Hip adduction    Hip internal rotation    Hip external rotation    Knee flexion 4+/5 4+/5  Knee extension 4+/5 4+/5  Ankle dorsiflexion 4+/5 4+/5  Ankle plantarflexion 4+/5 4+/5  Ankle inversion    Ankle eversion     (Blank rows = not tested)  FUNCTIONAL TESTS:  5 times sit to stand: 18 seconds Timed up and go (TUG): 38 seconds (with FWW).  DTR's:  Left Pat absent, Right 2+/4+, absent bilateral Achilles.  GAIT: The patient walks with a FWW (used gait belt).  His gait is axtaxic in nature and he walks with  stiff-legged gait pattern without knee flexion and sustained dorsiflexion.  TRANSFERS:  Sit to stand with armrest usage and supervision.  Sit to supine to sit with supervision.  TODAY'S TREATMENT:                                    06/03/23 EXERCISE LOG  Exercise Repetitions and Resistance Comments  Nustep  L4-5 x 20 minutes    Seated HS curl  Red t-band x 2 minutes each    LAQ 4# x 2.5 minutes Alternating LE  Seated hip flexion  4# x 2.5 minutes   Seated hip ADD isometric  3 minutes w/ 5 second hold    Rocker board (seated) 3 minutes     Blank cell = exercise not performed today                                    05/26/23 EXERCISE LOG  Exercise Repetitions and Resistance Comments  Nustep  L5 x 20 minutes   LAQ 4# x 2.5 minutes    Seated hip flexion  4# x 2.5 minutes    Seated hip abduction Red x 3 mins Intermittent upper extremity support   Seated hip adduction 3 mins   Seated ham curls Red x 30 reps bil    Blank cell = exercise not performed today                                     05/14/13: EXERCISE LOG  Exercise Repetitions and  Resistance Comments  Nustep  Level 5 x 15 minutes   Red hip abduction and marching  2 minutes each   Airex balance pad  With UE support x 3 minutes   Rockerboard  3 minutes with UE parallel bar support   Standing UBE in partial bilateral knee flexion. 6 minutes (3 minutes forward and 3 minutes backward).    Blank cell = exercise not performed today                                   PATIENT EDUCATION:   Patient was educated on the benefits of weightbearing interventions, bracing, and healing. He reported understanding.  HOME EXERCISE PROGRAM:  ASSESSMENT:  CLINICAL IMPRESSION: Today's treatment focused on familiar interventions for improved lower extremity strength needed for improved function with activities such as sit to stand transfers. He required minimal cueing with today's interventions for proper exercise performance. He experienced the most difficulty and fatigue with seated hamstring curls, but this did not limit his ability to complete any of today's other interventions. He reported feeling tired upon the conclusion of treatment. He continues to require skilled physical therapy to address his remaining impairments to maximize his safety and functional mobility.   OBJECTIVE IMPAIRMENTS: Abnormal gait, decreased activity tolerance, decreased balance, decreased coordination, difficulty walking, postural dysfunction, and pain.   ACTIVITY LIMITATIONS: carrying, lifting, bending, standing, squatting, stairs, bathing, dressing, and locomotion level  PARTICIPATION LIMITATIONS: meal prep, cleaning, medication management, shopping, community activity, and yard work  PERSONAL FACTORS: Time since onset of injury/illness/exacerbation and 1-2 comorbidities: multiple spinal surgeries and infection  are also affecting patient's functional outcome.   REHAB POTENTIAL: Fair plus/good minus  CLINICAL DECISION MAKING: Evolving/moderate complexity  EVALUATION COMPLEXITY:  Moderate   GOALS:  LONG TERM GOALS: Target date: 06/17/23  Ind with a HEP.  Goal status: INITIAL  2.  Improve TUG with appropriate assistive device to 22 seconds  Goal status: INITIAL  3.  5 time sit to stand performed in 13-14 seconds.  Goal status: INITIAL  4.  Walk with appropriate assistive device 250 feet exhibiting normal knee flexion.  Goal status: INITIAL  5.  Perform a reciprocating stair gait with one railing.  Goal status: INITIAL  PLAN:  PT FREQUENCY: 2x/week  PT DURATION: 6 weeks  PLANNED INTERVENTIONS: Therapeutic exercises, Therapeutic activity, Neuromuscular re-education, Balance training, Gait training, Patient/Family education, Self Care, and Stair training  PLAN FOR NEXT SESSION: Nustep, gait and balance activities.  Neuro re-education.   Granville Lewis, PT 06/03/2023, 4:45 PM

## 2023-06-05 ENCOUNTER — Ambulatory Visit: Payer: Medicare HMO

## 2023-06-05 DIAGNOSIS — R2681 Unsteadiness on feet: Secondary | ICD-10-CM

## 2023-06-05 DIAGNOSIS — M6281 Muscle weakness (generalized): Secondary | ICD-10-CM

## 2023-06-05 NOTE — Therapy (Signed)
OUTPATIENT PHYSICAL THERAPY LOWER EXTREMITY TREATMENT   Patient Name: John Ware MRN: [id number] DOB:1966-08-26, 57 y.o., male Today's Date: 06/05/2023  END OF SESSION:  PT End of Session - 06/05/23 1615     Visit Number 7    Number of Visits 12    Date for PT Re-Evaluation 06/17/23    PT Start Time 1610    PT Stop Time 1655    PT Time Calculation (min) 45 min    Activity Tolerance Patient tolerated treatment well    Behavior During Therapy WFL for tasks assessed/performed                 Past Medical History:  Diagnosis Date   Depression    Diabetes mellitus without complication (HCC)    Hypertension    Spinal stenosis    With Neurogenic Claudication   Past Surgical History:  Procedure Laterality Date   CARPAL TUNNEL RELEASE Bilateral    INCISION AND DRAINAGE ABSCESS Left 03/12/2021   Procedure: INCISION AND DRAINAGE ABSCESS;  Surgeon: [name], MD;  Location: [facility];  Service: Orthopedics;  Laterality: Left;   IR FLUORO GUIDED NEEDLE PLC ASPIRATION/INJECTION LOC  04/24/2022   IR FLUORO GUIDED NEEDLE PLC ASPIRATION/INJECTION LOC  12/05/2022   IR FLUORO GUIDED NEEDLE PLC ASPIRATION/INJECTION LOC  03/05/2023   IR FLUORO GUIDED NEEDLE PLC ASPIRATION/INJECTION LOC  03/05/2023   IR US GUIDE BX ASP/DRAIN  02/02/2021   IRRIGATION AND DEBRIDEMENT ABSCESS Left 02/08/2021   Procedure: MINOR INCISION AND DRAINAGE OF ABSCESS;  Surgeon: [name], MD;  Location: [facility];  Service: Orthopedics;  Laterality: Left;   IRRIGATION AND DEBRIDEMENT ABSCESS Left 02/10/2021   Procedure: IRRIGATION AND DEBRIDEMENT ABSCESS LEFT LATERAL CHEST WALL;  Surgeon: [name], MD;  Location: [facility];  Service: General;  Laterality: Left;   IRRIGATION AND DEBRIDEMENT ABSCESS Bilateral 02/20/2021   Procedure: MINOR INCISION AND DRAINAGE OF ABSCESS/WITH BILATERAL WOUND VAC PLACEMENT;  Surgeon: [name], MD;  Location: [facility];  Service: Orthopedics;  Laterality: Bilateral;    IRRIGATION AND DEBRIDEMENT SHOULDER Bilateral 02/28/2021   Procedure: IRRIGATION AND DEBRIDEMENT SHOULDER;  Surgeon: [name], MD;  Location: [facility];  Service: Orthopedics;  Laterality: Bilateral;   IRRIGATION AND DEBRIDEMENT SHOULDER Left 03/06/2021   Procedure: IRRIGATION AND DEBRIDEMENT SHOULDER;  Surgeon: [name], MD;  Location: [facility];  Service: Orthopedics;  Laterality: Left;   IRRIGATION AND DEBRIDEMENT SHOULDER Left 03/08/2021   Procedure: IRRIGATION AND DEBRIDEMENT SHOULDER;  Surgeon: [name], MD;  Location: [facility];  Service: Orthopedics;  Laterality: Left;   LUMBAR LAMINECTOMY FOR EPIDURAL ABSCESS N/A 05/02/2022   Procedure: THORACIC  LAMINECTOMY AND DEBRIDEMENT  FOR EPIDURAL ABSCESS;  Surgeon: [name], MD;  Location: [facility];  Service: Neurosurgery;  Laterality: N/A;   LUMBAR LAMINECTOMY/DECOMPRESSION MICRODISCECTOMY N/A 12/13/2014   Procedure: LUMBAR THREE-FOUR, LUMBAR FOUR-FIVE, LUMBAR FIVE-SACRAL ONE LAMINECTOMY;  Surgeon: [name], MD;  Location: [facility];  Service: Neurosurgery;  Laterality: N/A;  L3-4 L4-5 L5-S1 Laminectomy   LUMBAR LAMINECTOMY/DECOMPRESSION MICRODISCECTOMY Left 06/05/2021   Procedure: Left lumbar one-two, Lumbar two-three Laminectomy;  Surgeon: [name], MD;  Location: [facility];  Service: Neurosurgery;  Laterality: Left;   MENISCUS REPAIR     Right knee   MINOR APPLICATION OF WOUND VAC Left 02/20/2021   Procedure: MINOR APPLICATION OF KERLIX PACKING/REMOVAL OF WOUND VAC;  Surgeon: [name], MD;  Location: [facility];  Service: General;  Laterality: Left;   RADIOLOGY WITH ANESTHESIA N/A 02/25/2023  Procedure: MRI WITH ANESTHESIA OF THORACIC AND LUMBAR WITH AND WITHOUT CONTRAST;  Surgeon: Radiologist, Medication, MD;  Location: [facility];  Service: Radiology;  Laterality: N/A;   RADIOLOGY WITH ANESTHESIA N/A 03/05/2023   Procedure: disc aspiration;  Surgeon: Baldemar Lenis, MD;  Location[facility];  Service: Radiology;   Laterality: N/A;   TEE WITHOUT CARDIOVERSION N/A 02/05/2021   Procedure: TRANSESOPHAGEAL ECHOCARDIOGRAM (TEE);  Surgeon: Quintella Reichert, MD;  Location: [facility];  Service: Cardiovascular;  Laterality: N/A;   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   TOTAL SHOULDER ARTHROPLASTY Right 02/10/2021   Procedure: RIGHT OPEN SHOULDER I & D, OPEN DISTAL CLAVICLE RESECTION;  Surgeon: [name], MD;  Location: [facility];  Service: Orthopedics;  Laterality: Right;   VASECTOMY     Patient Active Problem List   Diagnosis Date Noted   PICC (peripherally inserted central catheter) in place 04/12/2023   Physical deconditioning 04/07/2023   Hypokalemia 02/22/2023   Chronic back pain 01/30/2023   Intractable back pain 12/04/2022   Medication management 12/04/2022   Vertebral osteomyelitis (HCC) 12/04/2022   Pseudomonas aeruginosa infection 07/07/2022   Osteomyelitis of thoracic region (HCC) 07/03/2022   Osteomyelitis of lumbar spine (HCC) 05/08/2022   Midline low back pain    Numbness    Osteomyelitis (HCC)    Chronic diastolic CHF (congestive heart failure) (HCC) 04/23/2022   Chronic pain 04/23/2022   Osteomyelitis of vertebra of thoracolumbar region (HCC) 04/22/2022   Presence of retained hardware 10/17/2021   Infective myositis 08/29/2021   Discitis of thoracolumbar region 07/31/2021   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   MRSA infection    Diabetic peripheral neuropathy (HCC)    Septic discitis of lumbar region 06/11/2021   Infection of spine (HCC) 05/28/2021   Medication monitoring encounter 05/09/2021   History of COVID-19    Psoas abscess (HCC)    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   Right shoulder pain 02/14/2021   Normocytic anemia 02/14/2021   Thrombocytosis 02/14/2021   Endocarditis 02/05/2021   Vision changes 02/04/2021   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   Pressure injury of skin 02/01/2021   HTN (hypertension) 02/01/2021   Anxiety 02/01/2021   Left  shoulder pain 02/01/2021   Hyponatremia 01/31/2021   Positive colorectal cancer screening using Cologuard test 12/28/2020   DOE (dyspnea on exertion) 10/20/2020   Diabetic polyneuropathy associated with type 2 diabetes mellitus (HCC) 10/09/2020   DM2 (diabetes mellitus, type 2) (HCC) 10/30/2018   Lumbar stenosis with neurogenic claudication 10/14/2017   DDD (degenerative disc disease), lumbar 02/17/2017   Transient weakness of right lower extremity 02/05/2017   Hyperlipidemia associated with type 2 diabetes mellitus (HCC) 10/24/2016   Depression, recurrent (HCC) 10/24/2016   Right knee pain 05/21/2016   DDD (degenerative disc disease), lumbosacral 05/21/2016   Herniation of nucleus pulposus of cervical intervertebral disc without myelopathy 02/29/2016   Carpal tunnel syndrome 02/08/2016   Cervical spondylosis with myelopathy 01/17/2016   Lumbar stenosis 12/13/2014    REFERRING PROVIDER: Jannifer Rodney  REFERRING DIAG: Generalized weakness.  THERAPY DIAG:  Muscle weakness (generalized)  Unsteadiness on feet  Rationale for Evaluation and Treatment: Rehabilitation  ONSET DATE: Per patient "2022."  SUBJECTIVE:   SUBJECTIVE STATEMENT: Pt reports 2/10 bil quad pain/soreness today. Pt states that he feels strong since beginning therapy.   PERTINENT HISTORY: Please see above. PAIN:  Are you having pain? 2/10.  Bil quad  PRECAUTIONS: Fall.  High fall risk.  Please use gait  belt and be with patient at all times.  WEIGHT BEARING RESTRICTIONS: No  FALLS:  Has patient fallen in last 6 months? No  LIVING ENVIRONMENT: Lives with: lives with their spouse Lives in: House/apartment Stairs:  Ramp. Has following equipment at home: Dan [place]reys - 2 wheeled  OCCUPATION: Retired/Disabled.  PLOF: Independent with household mobility with device.  Using a FWW at home and also wheelchair.  PATIENT GOALS: He wants to walk independently again.  OBJECTIVE:    SENSATION: He c/o bilateral  foot numbness.  POSTURE: rounded shoulders, forward head, and flexed trunk .  He stands in 30 degrees of trunk flexion and states he cannot straighten further.   LOWER EXTREMITY ROM:  His bilateral LE range of motion assessed in supine is WFL.  LOWER EXTREMITY MMT:  MMT Right eval Left eval  Hip flexion 4+/5 4+/5  Hip extension    Hip abduction 4+/5 4/5  Hip adduction    Hip internal rotation    Hip external rotation    Knee flexion 4+/5 4+/5  Knee extension 4+/5 4+/5  Ankle dorsiflexion 4+/5 4+/5  Ankle plantarflexion 4+/5 4+/5  Ankle inversion    Ankle eversion     (Blank rows = not tested)  FUNCTIONAL TESTS:  5 times sit to stand: 18 seconds Timed up and go (TUG): 38 seconds (with FWW).  DTR's:  Left Pat absent, Right 2+/4+, absent bilateral Achilles.  GAIT: The patient walks with a FWW (used gait belt).  His gait is axtaxic in nature and he walks with  stiff-legged gait pattern without knee flexion and sustained dorsiflexion.  TRANSFERS:  Sit to stand with armrest usage and supervision.  Sit to supine to sit with supervision.  TODAY'S TREATMENT:                                    06/05/23 EXERCISE LOG  Exercise Repetitions and Resistance Comments  Nustep  L4-5 x 20 minutes    Seated HS curl  Red t-band x 3 minutes each    LAQ 4# x 3.5 minutes Alternating LE  Seated hip flexion  4# x 3.5 minutes   Seated hip ADD isometric  3 minutes w/ 5 second hold    Rocker board (seated) 3 minutes     Blank cell = exercise not performed today                                    05/26/23 EXERCISE LOG  Exercise Repetitions and Resistance Comments  Nustep  L5 x 20 minutes   LAQ 4# x 2.5 minutes    Seated hip flexion  4# x 2.5 minutes    Seated hip abduction Red x 3 mins Intermittent upper extremity support   Seated hip adduction 3 mins   Seated ham curls Red x 30 reps bil    Blank cell = exercise not performed today                                     05/14/13:  EXERCISE LOG  Exercise Repetitions and Resistance Comments  Nustep  Level 5 x 15 minutes   Red hip abduction and marching  2 minutes each   Airex balance pad  With UE support x 3 minutes   Rockerboard  3 minutes with UE parallel bar support   Standing UBE in partial bilateral knee flexion. 6 minutes (3 minutes forward and 3 minutes backward).    Blank cell = exercise not performed today                                PATIENT EDUCATION:   Patient was educated on the benefits of weightbearing interventions, bracing, and healing. He reported understanding.  HOME EXERCISE PROGRAM:  ASSESSMENT:  CLINICAL IMPRESSION: Pt arrives for today's treatment session reporting 2/10 bil quad pain.  Pt able to tolerate increased time or reps with all exercises performed today.  Pt reports that he has noticed a positive change in his endurance and LE strength.  Pt denied any change in pain at completion of today's treatment session.   OBJECTIVE IMPAIRMENTS: Abnormal gait, decreased activity tolerance, decreased balance, decreased coordination, difficulty walking, postural dysfunction, and pain.   ACTIVITY LIMITATIONS: carrying, lifting, bending, standing, squatting, stairs, bathing, dressing, and locomotion level  PARTICIPATION LIMITATIONS: meal prep, cleaning, medication management, shopping, community activity, and yard work  PERSONAL FACTORS: Time since onset of injury/illness/exacerbation and 1-2 comorbidities: multiple spinal surgeries and infection  are also affecting patient's functional outcome.   REHAB POTENTIAL: Fair plus/good minus  CLINICAL DECISION MAKING: Evolving/moderate complexity  EVALUATION COMPLEXITY: Moderate   GOALS:  LONG TERM GOALS: Target date: 06/17/23  Ind with a HEP.  Goal status: IN PROGRESS  2.  Improve TUG with appropriate assistive device to 22 seconds  Goal status: IN PROGRESS  3.  5 time sit to stand performed in 13-14 seconds.  Goal status: IN  PROGRESS  4.  Walk with appropriate assistive device 250 feet exhibiting normal knee flexion.  Goal status: IN PROGRESS  5.  Perform a reciprocating stair gait with one railing.  Goal status: IN PROGRESS  PLAN:  PT FREQUENCY: 2x/week  PT DURATION: 6 weeks  PLANNED INTERVENTIONS: Therapeutic exercises, Therapeutic activity, Neuromuscular re-education, Balance training, Gait training, Patient/Family education, Self Care, and Stair training  PLAN FOR NEXT SESSION: Nustep, gait and balance activities.  Neuro re-education.   Newman Pies, PTA 06/05/2023, 4:55 PM

## 2023-06-10 ENCOUNTER — Ambulatory Visit: Payer: Medicare HMO

## 2023-06-10 DIAGNOSIS — M6281 Muscle weakness (generalized): Secondary | ICD-10-CM

## 2023-06-10 DIAGNOSIS — R2681 Unsteadiness on feet: Secondary | ICD-10-CM

## 2023-06-10 NOTE — Therapy (Signed)
OUTPATIENT PHYSICAL THERAPY LOWER EXTREMITY TREATMENT   Patient Name: John Ware MRN: [id number] DOB:1965-12-27, 57 y.o., male Today's Date: 06/10/2023  END OF SESSION:  PT End of Session - 06/10/23 1524     Visit Number 8    Number of Visits 12    Date for PT Re-Evaluation 06/17/23    PT Start Time 1522    PT Stop Time 1606    PT Time Calculation (min) 44 min    Activity Tolerance Patient tolerated treatment well    Behavior During Therapy WFL for tasks assessed/performed                  Past Medical History:  Diagnosis Date   Depression    Diabetes mellitus without complication (HCC)    Hypertension    Spinal stenosis    With Neurogenic Claudication   Past Surgical History:  Procedure Laterality Date   CARPAL TUNNEL RELEASE Bilateral    INCISION AND DRAINAGE ABSCESS Left 03/12/2021   Procedure: INCISION AND DRAINAGE ABSCESS;  Surgeon: [name], MD;  Location: [facility];  Service: Orthopedics;  Laterality: Left;   IR FLUORO GUIDED NEEDLE PLC ASPIRATION/INJECTION LOC  04/24/2022   IR FLUORO GUIDED NEEDLE PLC ASPIRATION/INJECTION LOC  12/05/2022   IR FLUORO GUIDED NEEDLE PLC ASPIRATION/INJECTION LOC  03/05/2023   IR FLUORO GUIDED NEEDLE PLC ASPIRATION/INJECTION LOC  03/05/2023   IR US GUIDE BX ASP/DRAIN  02/02/2021   IRRIGATION AND DEBRIDEMENT ABSCESS Left 02/08/2021   Procedure: MINOR INCISION AND DRAINAGE OF ABSCESS;  Surgeon: [name], MD;  Location: [facility];  Service: Orthopedics;  Laterality: Left;   IRRIGATION AND DEBRIDEMENT ABSCESS Left 02/10/2021   Procedure: IRRIGATION AND DEBRIDEMENT ABSCESS LEFT LATERAL CHEST WALL;  Surgeon: [name], MD;  Location: [facility];  Service: General;  Laterality: Left;   IRRIGATION AND DEBRIDEMENT ABSCESS Bilateral 02/20/2021   Procedure: MINOR INCISION AND DRAINAGE OF ABSCESS/WITH BILATERAL WOUND VAC PLACEMENT;  Surgeon: [name], MD;  Location: [facility];  Service: Orthopedics;  Laterality: Bilateral;    IRRIGATION AND DEBRIDEMENT SHOULDER Bilateral 02/28/2021   Procedure: IRRIGATION AND DEBRIDEMENT SHOULDER;  Surgeon: [name], MD;  Location: [facility];  Service: Orthopedics;  Laterality: Bilateral;   IRRIGATION AND DEBRIDEMENT SHOULDER Left 03/06/2021   Procedure: IRRIGATION AND DEBRIDEMENT SHOULDER;  Surgeon: [name], MD;  Location: [facility];  Service: Orthopedics;  Laterality: Left;   IRRIGATION AND DEBRIDEMENT SHOULDER Left 03/08/2021   Procedure: IRRIGATION AND DEBRIDEMENT SHOULDER;  Surgeon: [name], MD;  Location: [facility];  Service: Orthopedics;  Laterality: Left;   LUMBAR LAMINECTOMY FOR EPIDURAL ABSCESS N/A 05/02/2022   Procedure: THORACIC  LAMINECTOMY AND DEBRIDEMENT  FOR EPIDURAL ABSCESS;  Surgeon: [name], MD;  Location: [facility];  Service: Neurosurgery;  Laterality: N/A;   LUMBAR LAMINECTOMY/DECOMPRESSION MICRODISCECTOMY N/A 12/13/2014   Procedure: LUMBAR THREE-FOUR, LUMBAR FOUR-FIVE, LUMBAR FIVE-SACRAL ONE LAMINECTOMY;  Surgeon: [name], MD;  Location: [facility];  Service: Neurosurgery;  Laterality: N/A;  L3-4 L4-5 L5-S1 Laminectomy   LUMBAR LAMINECTOMY/DECOMPRESSION MICRODISCECTOMY Left 06/05/2021   Procedure: Left lumbar one-two, Lumbar two-three Laminectomy;  Surgeon: [name], MD;  Location: [facility];  Service: Neurosurgery;  Laterality: Left;   MENISCUS REPAIR     Right knee   MINOR APPLICATION OF WOUND VAC Left 02/20/2021   Procedure: MINOR APPLICATION OF KERLIX PACKING/REMOVAL OF WOUND VAC;  Surgeon: [name], MD;  Location: [facility];  Service: General;  Laterality: Left;   RADIOLOGY WITH ANESTHESIA N/A 02/25/2023  Procedure: MRI WITH ANESTHESIA OF THORACIC AND LUMBAR WITH AND WITHOUT CONTRAST;  Surgeon: Radiologist, Medication, MD;  Location: [facility];  Service: Radiology;  Laterality: N/A;   RADIOLOGY WITH ANESTHESIA N/A 03/05/2023   Procedure: disc aspiration;  Surgeon: Baldemar Lenis, MD;  Location: [facility] OR;  Service: Radiology;   Laterality: N/A;   TEE WITHOUT CARDIOVERSION N/A 02/05/2021   Procedure: TRANSESOPHAGEAL ECHOCARDIOGRAM (TEE);  Surgeon: Quintella Reichert, MD;  Location: [facility];  Service: Cardiovascular;  Laterality: N/A;   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   TOTAL SHOULDER ARTHROPLASTY Right 02/10/2021   Procedure: RIGHT OPEN SHOULDER I & D, OPEN DISTAL CLAVICLE RESECTION;  Surgeon: [name], MD;  Location: [facility];  Service: Orthopedics;  Laterality: Right;   VASECTOMY     Patient Active Problem List   Diagnosis Date Noted   PICC (peripherally inserted central catheter) in place 04/12/2023   Physical deconditioning 04/07/2023   Hypokalemia 02/22/2023   Chronic back pain 01/30/2023   Intractable back pain 12/04/2022   Medication management 12/04/2022   Vertebral osteomyelitis (HCC) 12/04/2022   Pseudomonas aeruginosa infection 07/07/2022   Osteomyelitis of thoracic region (HCC) 07/03/2022   Osteomyelitis of lumbar spine (HCC) 05/08/2022   Midline low back pain    Numbness    Osteomyelitis (HCC)    Chronic diastolic CHF (congestive heart failure) (HCC) 04/23/2022   Chronic pain 04/23/2022   Osteomyelitis of vertebra of thoracolumbar region (HCC) 04/22/2022   Presence of retained hardware 10/17/2021   Infective myositis 08/29/2021   Discitis of thoracolumbar region 07/31/2021   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   MRSA infection    Diabetic peripheral neuropathy (HCC)    Septic discitis of lumbar region 06/11/2021   Infection of spine (HCC) 05/28/2021   Medication monitoring encounter 05/09/2021   History of COVID-19    Psoas abscess (HCC)    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   Right shoulder pain 02/14/2021   Normocytic anemia 02/14/2021   Thrombocytosis 02/14/2021   Endocarditis 02/05/2021   Vision changes 02/04/2021   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   Pressure injury of skin 02/01/2021   HTN (hypertension) 02/01/2021   Anxiety 02/01/2021   Left  shoulder pain 02/01/2021   Hyponatremia 01/31/2021   Positive colorectal cancer screening using Cologuard test 12/28/2020   DOE (dyspnea on exertion) 10/20/2020   Diabetic polyneuropathy associated with type 2 diabetes mellitus (HCC) 10/09/2020   DM2 (diabetes mellitus, type 2) (HCC) 10/30/2018   Lumbar stenosis with neurogenic claudication 10/14/2017   DDD (degenerative disc disease), lumbar 02/17/2017   Transient weakness of right lower extremity 02/05/2017   Hyperlipidemia associated with type 2 diabetes mellitus (HCC) 10/24/2016   Depression, recurrent (HCC) 10/24/2016   Right knee pain 05/21/2016   DDD (degenerative disc disease), lumbosacral 05/21/2016   Herniation of nucleus pulposus of cervical intervertebral disc without myelopathy 02/29/2016   Carpal tunnel syndrome 02/08/2016   Cervical spondylosis with myelopathy 01/17/2016   Lumbar stenosis 12/13/2014    REFERRING PROVIDER: Jannifer Rodney  REFERRING DIAG: Generalized weakness.  THERAPY DIAG:  Muscle weakness (generalized)  Unsteadiness on feet  Rationale for Evaluation and Treatment: Rehabilitation  ONSET DATE: Per patient "2022."  SUBJECTIVE:   SUBJECTIVE STATEMENT: Patient reported feeling good today.   PERTINENT HISTORY: Please see above. PAIN:  Are you having pain? 2/10.  Bil quad  PRECAUTIONS: Fall.  High fall risk.  Please use gait belt and be with patient at all times.  WEIGHT BEARING  RESTRICTIONS: No  FALLS:  Has patient fallen in last 6 months? No  LIVING ENVIRONMENT: Lives with: lives with their spouse Lives in: House/apartment Stairs:  Ramp. Has following equipment at home: Dan Humphreys - 2 wheeled  OCCUPATION: Retired/Disabled.  PLOF: Independent with household mobility with device.  Using a FWW at home and also wheelchair.  PATIENT GOALS: He wants to walk independently again.  OBJECTIVE:    SENSATION: He c/o bilateral foot numbness.  POSTURE: rounded shoulders, forward head, and  flexed trunk .  He stands in 30 degrees of trunk flexion and states he cannot straighten further.   LOWER EXTREMITY ROM:  His bilateral LE range of motion assessed in supine is WFL.  LOWER EXTREMITY MMT:  MMT Right eval Left eval  Hip flexion 4+/5 4+/5  Hip extension    Hip abduction 4+/5 4/5  Hip adduction    Hip internal rotation    Hip external rotation    Knee flexion 4+/5 4+/5  Knee extension 4+/5 4+/5  Ankle dorsiflexion 4+/5 4+/5  Ankle plantarflexion 4+/5 4+/5  Ankle inversion    Ankle eversion     (Blank rows = not tested)  FUNCTIONAL TESTS:  5 times sit to stand: 18 seconds Timed up and go (TUG): 38 seconds (with FWW).  DTR's:  Left Pat absent, Right 2+/4+, absent bilateral Achilles.  GAIT: The patient walks with a FWW (used gait belt).  His gait is axtaxic in nature and he walks with  stiff-legged gait pattern without knee flexion and sustained dorsiflexion.  TRANSFERS:  Sit to stand with armrest usage and supervision.  Sit to supine to sit with supervision.  TODAY'S TREATMENT:                                    06/10/23 EXERCISE LOG  Exercise Repetitions and Resistance Comments  Nustep  L4-5 x 20 minutes   Static stance  3 minutes  Intermittent UE support (x1 minA from therapist)  LAQ 5# x 3 minutes    Seated marching  5# x 3 minutes    Standing marching  2 minutes  BUE support  Seated hip ADD isometric  3 minutes w/ 5 second hold     Blank cell = exercise not performed today                                    06/05/23 EXERCISE LOG  Exercise Repetitions and Resistance Comments  Nustep  L4-5 x 20 minutes    Seated HS curl  Red t-band x 3 minutes each    LAQ 4# x 3.5 minutes Alternating LE  Seated hip flexion  4# x 3.5 minutes   Seated hip ADD isometric  3 minutes w/ 5 second hold    Rocker board (seated) 3 minutes     Blank cell = exercise not performed today                                    05/26/23 EXERCISE LOG  Exercise Repetitions and  Resistance Comments  Nustep  L5 x 20 minutes   LAQ 4# x 2.5 minutes    Seated hip flexion  4# x 2.5 minutes    Seated hip abduction Red x 3 mins Intermittent upper extremity support  Seated hip adduction 3 mins   Seated ham curls Red x 30 reps bil    Blank cell = exercise not performed today   PATIENT EDUCATION:   Patient was educated on the benefits of weightbearing interventions, bracing, and healing. He reported understanding.  HOME EXERCISE PROGRAM:  ASSESSMENT:  CLINICAL IMPRESSION: Patient presented to treatment utilizing his powered wheelchair for mobility. Today's treatment alternated seated and standing interventions for improved lower extremity strength and tolerance with standing interventions. He required moderate multimodal cueing with today's standing interventions for upright posture to promote improved awareness of his surroundings. His standing interventions were limited due to increased low back pain upon the conclusion of each standing intervention. However, this did not limit his ability to complete any of the following interventions. He reported feeling tired upon the conclusion of treatment. He continues to require skilled physical therapy to address his impairments to maximize his safety and functional mobility.   OBJECTIVE IMPAIRMENTS: Abnormal gait, decreased activity tolerance, decreased balance, decreased coordination, difficulty walking, postural dysfunction, and pain.   ACTIVITY LIMITATIONS: carrying, lifting, bending, standing, squatting, stairs, bathing, dressing, and locomotion level  PARTICIPATION LIMITATIONS: meal prep, cleaning, medication management, shopping, community activity, and yard work  PERSONAL FACTORS: Time since onset of injury/illness/exacerbation and 1-2 comorbidities: multiple spinal surgeries and infection  are also affecting patient's functional outcome.   REHAB POTENTIAL: Fair plus/good minus  CLINICAL DECISION MAKING:  Evolving/moderate complexity  EVALUATION COMPLEXITY: Moderate   GOALS:  LONG TERM GOALS: Target date: 06/17/23  Ind with a HEP.  Goal status: IN PROGRESS  2.  Improve TUG with appropriate assistive device to 22 seconds  Goal status: IN PROGRESS  3.  5 time sit to stand performed in 13-14 seconds.  Goal status: IN PROGRESS  4.  Walk with appropriate assistive device 250 feet exhibiting normal knee flexion.  Goal status: IN PROGRESS  5.  Perform a reciprocating stair gait with one railing.  Goal status: IN PROGRESS  PLAN:  PT FREQUENCY: 2x/week  PT DURATION: 6 weeks  PLANNED INTERVENTIONS: Therapeutic exercises, Therapeutic activity, Neuromuscular re-education, Balance training, Gait training, Patient/Family education, Self Care, and Stair training  PLAN FOR NEXT SESSION: Nustep, gait and balance activities.  Neuro re-education.   Granville Lewis, PT 06/10/2023, 4:20 PM

## 2023-06-12 ENCOUNTER — Ambulatory Visit: Payer: Medicare HMO

## 2023-06-12 DIAGNOSIS — M6281 Muscle weakness (generalized): Secondary | ICD-10-CM | POA: Diagnosis not present

## 2023-06-12 DIAGNOSIS — R2681 Unsteadiness on feet: Secondary | ICD-10-CM

## 2023-06-12 NOTE — Therapy (Signed)
OUTPATIENT PHYSICAL THERAPY LOWER EXTREMITY TREATMENT   Patient Name: John Ware MRN: [id number] DOB:May 06, 1966, 57 y.o., male Today's Date: 06/12/2023  END OF SESSION:  PT End of Session - 06/12/23 1611     Visit Number 9    Number of Visits 12    Date for PT Re-Evaluation 06/17/23    PT Start Time 1600    PT Stop Time 1645    PT Time Calculation (min) 45 min    Activity Tolerance Patient tolerated treatment well    Behavior During Therapy WFL for tasks assessed/performed                  Past Medical History:  Diagnosis Date   Depression    Diabetes mellitus without complication (HCC)    Hypertension    Spinal stenosis    With Neurogenic Claudication   Past Surgical History:  Procedure Laterality Date   CARPAL TUNNEL RELEASE Bilateral    INCISION AND DRAINAGE ABSCESS Left 03/12/2021   Procedure: INCISION AND DRAINAGE ABSCESS;  Surgeon: [name], MD;  Location: [facility];  Service: Orthopedics;  Laterality: Left;   IR FLUORO GUIDED NEEDLE PLC ASPIRATION/INJECTION LOC  04/24/2022   IR FLUORO GUIDED NEEDLE PLC ASPIRATION/INJECTION LOC  12/05/2022   IR FLUORO GUIDED NEEDLE PLC ASPIRATION/INJECTION LOC  03/05/2023   IR FLUORO GUIDED NEEDLE PLC ASPIRATION/INJECTION LOC  03/05/2023   IR US GUIDE BX ASP/DRAIN  02/02/2021   IRRIGATION AND DEBRIDEMENT ABSCESS Left 02/08/2021   Procedure: MINOR INCISION AND DRAINAGE OF ABSCESS;  Surgeon: [name], MD;  Location: [facility];  Service: Orthopedics;  Laterality: Left;   IRRIGATION AND DEBRIDEMENT ABSCESS Left 02/10/2021   Procedure: IRRIGATION AND DEBRIDEMENT ABSCESS LEFT LATERAL CHEST WALL;  Surgeon: [name], MD;  Location: [facility];  Service: General;  Laterality: Left;   IRRIGATION AND DEBRIDEMENT ABSCESS Bilateral 02/20/2021   Procedure: MINOR INCISION AND DRAINAGE OF ABSCESS/WITH BILATERAL WOUND VAC PLACEMENT;  Surgeon: [name], MD;  Location: [facility];  Service: Orthopedics;  Laterality: Bilateral;    IRRIGATION AND DEBRIDEMENT SHOULDER Bilateral 02/28/2021   Procedure: IRRIGATION AND DEBRIDEMENT SHOULDER;  Surgeon: [name], MD;  Location: [facility];  Service: Orthopedics;  Laterality: Bilateral;   IRRIGATION AND DEBRIDEMENT SHOULDER Left 03/06/2021   Procedure: IRRIGATION AND DEBRIDEMENT SHOULDER;  Surgeon: [name], MD;  Location: [facility];  Service: Orthopedics;  Laterality: Left;   IRRIGATION AND DEBRIDEMENT SHOULDER Left 03/08/2021   Procedure: IRRIGATION AND DEBRIDEMENT SHOULDER;  Surgeon: [name], MD;  Location: [facility];  Service: Orthopedics;  Laterality: Left;   LUMBAR LAMINECTOMY FOR EPIDURAL ABSCESS N/A 05/02/2022   Procedure: THORACIC  LAMINECTOMY AND DEBRIDEMENT  FOR EPIDURAL ABSCESS;  Surgeon: [name], MD;  Location: [facility];  Service: Neurosurgery;  Laterality: N/A;   LUMBAR LAMINECTOMY/DECOMPRESSION MICRODISCECTOMY N/A 12/13/2014   Procedure: LUMBAR THREE-FOUR, LUMBAR FOUR-FIVE, LUMBAR FIVE-SACRAL ONE LAMINECTOMY;  Surgeon: [name], MD;  Location: [facility];  Service: Neurosurgery;  Laterality: N/A;  L3-4 L4-5 L5-S1 Laminectomy   LUMBAR LAMINECTOMY/DECOMPRESSION MICRODISCECTOMY Left 06/05/2021   Procedure: Left lumbar one-two, Lumbar two-three Laminectomy;  Surgeon: [name], MD;  Location: [facility];  Service: Neurosurgery;  Laterality: Left;   MENISCUS REPAIR     Right knee   MINOR APPLICATION OF WOUND VAC Left 02/20/2021   Procedure: MINOR APPLICATION OF KERLIX PACKING/REMOVAL OF WOUND VAC;  Surgeon: [name], MD;  Location: [facility];  Service: General;  Laterality: Left;   RADIOLOGY WITH ANESTHESIA N/A 02/25/2023  Procedure: MRI WITH ANESTHESIA OF THORACIC AND LUMBAR WITH AND WITHOUT CONTRAST;  Surgeon: Radiologist, Medication, MD;  Location: [facility];  Service: Radiology;  Laterality: N/A;   RADIOLOGY WITH ANESTHESIA N/A 03/05/2023   Procedure: disc aspiration;  Surgeon: Baldemar Lenis, MD;  Location: [facility] OR;  Service: Radiology;   Laterality: N/A;   TEE WITHOUT CARDIOVERSION N/A 02/05/2021   Procedure: TRANSESOPHAGEAL ECHOCARDIOGRAM (TEE);  Surgeon: Quintella Reichert, MD;  Location: [facility];  Service: Cardiovascular;  Laterality: N/A;   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   TOTAL SHOULDER ARTHROPLASTY Right 02/10/2021   Procedure: RIGHT OPEN SHOULDER I & D, OPEN DISTAL CLAVICLE RESECTION;  Surgeon: [name], MD;  Location: [facility];  Service: Orthopedics;  Laterality: Right;   VASECTOMY     Patient Active Problem List   Diagnosis Date Noted   PICC (peripherally inserted central catheter) in place 04/12/2023   Physical deconditioning 04/07/2023   Hypokalemia 02/22/2023   Chronic back pain 01/30/2023   Intractable back pain 12/04/2022   Medication management 12/04/2022   Vertebral osteomyelitis (HCC) 12/04/2022   Pseudomonas aeruginosa infection 07/07/2022   Osteomyelitis of thoracic region (HCC) 07/03/2022   Osteomyelitis of lumbar spine (HCC) 05/08/2022   Midline low back pain    Numbness    Osteomyelitis (HCC)    Chronic diastolic CHF (congestive heart failure) (HCC) 04/23/2022   Chronic pain 04/23/2022   Osteomyelitis of vertebra of thoracolumbar region (HCC) 04/22/2022   Presence of retained hardware 10/17/2021   Infective myositis 08/29/2021   Discitis of thoracolumbar region 07/31/2021   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   MRSA infection    Diabetic peripheral neuropathy (HCC)    Septic discitis of lumbar region 06/11/2021   Infection of spine (HCC) 05/28/2021   Medication monitoring encounter 05/09/2021   History of COVID-19    Psoas abscess (HCC)    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   Right shoulder pain 02/14/2021   Normocytic anemia 02/14/2021   Thrombocytosis 02/14/2021   Endocarditis 02/05/2021   Vision changes 02/04/2021   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   Pressure injury of skin 02/01/2021   HTN (hypertension) 02/01/2021   Anxiety 02/01/2021   Left  shoulder pain 02/01/2021   Hyponatremia 01/31/2021   Positive colorectal cancer screening using Cologuard test 12/28/2020   DOE (dyspnea on exertion) 10/20/2020   Diabetic polyneuropathy associated with type 2 diabetes mellitus (HCC) 10/09/2020   DM2 (diabetes mellitus, type 2) (HCC) 10/30/2018   Lumbar stenosis with neurogenic claudication 10/14/2017   DDD (degenerative disc disease), lumbar 02/17/2017   Transient weakness of right lower extremity 02/05/2017   Hyperlipidemia associated with type 2 diabetes mellitus (HCC) 10/24/2016   Depression, recurrent (HCC) 10/24/2016   Right knee pain 05/21/2016   DDD (degenerative disc disease), lumbosacral 05/21/2016   Herniation of nucleus pulposus of cervical intervertebral disc without myelopathy 02/29/2016   Carpal tunnel syndrome 02/08/2016   Cervical spondylosis with myelopathy 01/17/2016   Lumbar stenosis 12/13/2014    REFERRING PROVIDER: Jannifer Rodney  REFERRING DIAG: Generalized weakness.  THERAPY DIAG:  Muscle weakness (generalized)  Unsteadiness on feet  Rationale for Evaluation and Treatment: Rehabilitation  ONSET DATE: Per patient "2022."  SUBJECTIVE:   SUBJECTIVE STATEMENT: Patient reported feeling good today.   PERTINENT HISTORY: Please see above. PAIN:  Are you having pain? No  PRECAUTIONS: Fall.  High fall risk.  Please use gait belt and be with patient at all times.  WEIGHT BEARING RESTRICTIONS: No  FALLS:  Has patient fallen in last 6 months? No  LIVING ENVIRONMENT: Lives with: lives with their spouse Lives in: House/apartment Stairs:  Ramp. Has following equipment at home: Dan Humphreys - 2 wheeled  OCCUPATION: Retired/Disabled.  PLOF: Independent with household mobility with device.  Using a FWW at home and also wheelchair.  PATIENT GOALS: He wants to walk independently again.  OBJECTIVE:    SENSATION: He c/o bilateral foot numbness.  POSTURE: rounded shoulders, forward head, and flexed trunk .   He stands in 30 degrees of trunk flexion and states he cannot straighten further.   LOWER EXTREMITY ROM:  His bilateral LE range of motion assessed in supine is WFL.  LOWER EXTREMITY MMT:  MMT Right eval Left eval  Hip flexion 4+/5 4+/5  Hip extension    Hip abduction 4+/5 4/5  Hip adduction    Hip internal rotation    Hip external rotation    Knee flexion 4+/5 4+/5  Knee extension 4+/5 4+/5  Ankle dorsiflexion 4+/5 4+/5  Ankle plantarflexion 4+/5 4+/5  Ankle inversion    Ankle eversion     (Blank rows = not tested)  FUNCTIONAL TESTS:  5 times sit to stand: 18 seconds Timed up and go (TUG): 38 seconds (with FWW).  DTR's:  Left Pat absent, Right 2+/4+, absent bilateral Achilles.  GAIT: The patient walks with a FWW (used gait belt).  His gait is axtaxic in nature and he walks with  stiff-legged gait pattern without knee flexion and sustained dorsiflexion.  TRANSFERS:  Sit to stand with armrest usage and supervision.  Sit to supine to sit with supervision.  TODAY'S TREATMENT:                                    06/12/23 EXERCISE LOG  Exercise Repetitions and Resistance Comments  Nustep  L4-5 x 20 minutes   Static stance  3 minutes  Intermittent UE support (x1 minA from therapist)  LAQ 5# x 3 minutes    Seated marching  5# x 3 minutes    Standing marching  2.5 minutes  BUE support  Seated Hip Adduction 3 mins   Seated Ham Curls Green x 25 reps bil    Blank cell = exercise not performed today                                    06/05/23 EXERCISE LOG  Exercise Repetitions and Resistance Comments  Nustep  L4-5 x 20 minutes    Seated HS curl  Red t-band x 3 minutes each    LAQ 4# x 3.5 minutes Alternating LE  Seated hip flexion  4# x 3.5 minutes   Seated hip ADD isometric  3 minutes w/ 5 second hold    Rocker board (seated) 3 minutes     Blank cell = exercise not performed today                                    05/26/23 EXERCISE LOG  Exercise Repetitions and  Resistance Comments  Nustep  L5 x 20 minutes   LAQ 4# x 2.5 minutes    Seated hip flexion  4# x 2.5 minutes    Seated hip abduction Red x 3 mins Intermittent upper extremity support  Seated hip adduction 3 mins   Seated ham curls Red x 30 reps bil    Blank cell = exercise not performed today   PATIENT EDUCATION:   Patient was educated on the benefits of weightbearing interventions, bracing, and healing. He reported understanding.  HOME EXERCISE PROGRAM:  ASSESSMENT:  CLINICAL IMPRESSION: Pt arrives for today's treatment session denying any pain.  Pt able to perform 5 STS in 10.38 seconds meeting his goal.  Pt also able to perform TUG in 30.26 seconds making good progress towards his goal.  Pt able to tolerate increased time with standing exercises today.  Pt reported increased fatigue at completion of today's treatment session, but denies any pain.  OBJECTIVE IMPAIRMENTS: Abnormal gait, decreased activity tolerance, decreased balance, decreased coordination, difficulty walking, postural dysfunction, and pain.   ACTIVITY LIMITATIONS: carrying, lifting, bending, standing, squatting, stairs, bathing, dressing, and locomotion level  PARTICIPATION LIMITATIONS: meal prep, cleaning, medication management, shopping, community activity, and yard work  PERSONAL FACTORS: Time since onset of injury/illness/exacerbation and 1-2 comorbidities: multiple spinal surgeries and infection  are also affecting patient's functional outcome.   REHAB POTENTIAL: Fair plus/good minus  CLINICAL DECISION MAKING: Evolving/moderate complexity  EVALUATION COMPLEXITY: Moderate   GOALS:  LONG TERM GOALS: Target date: 06/17/23  Ind with a HEP.  Goal status: IN PROGRESS  2.  Improve TUG with appropriate assistive device to 22 seconds   10/17: 30.26 seconds Goal status: IN PROGRESS  3.  5 time sit to stand performed in 13-14 seconds.   10/17: 10.38 seconds Goal status: MET  4.  Walk with appropriate  assistive device 250 feet exhibiting normal knee flexion.  Goal status: IN PROGRESS  5.  Perform a reciprocating stair gait with one railing.  Goal status: IN PROGRESS  PLAN:  PT FREQUENCY: 2x/week  PT DURATION: 6 weeks  PLANNED INTERVENTIONS: Therapeutic exercises, Therapeutic activity, Neuromuscular re-education, Balance training, Gait training, Patient/Family education, Self Care, and Stair training  PLAN FOR NEXT SESSION: Nustep, gait and balance activities.  Neuro re-education.   Newman Pies, PTA 06/12/2023, 4:53 PM

## 2023-06-17 ENCOUNTER — Ambulatory Visit: Payer: Medicare HMO

## 2023-06-17 DIAGNOSIS — M6281 Muscle weakness (generalized): Secondary | ICD-10-CM | POA: Diagnosis not present

## 2023-06-17 DIAGNOSIS — R2681 Unsteadiness on feet: Secondary | ICD-10-CM

## 2023-06-17 NOTE — Therapy (Signed)
OUTPATIENT PHYSICAL THERAPY LOWER EXTREMITY TREATMENT   Patient Name: John Ware MRN: [id number] DOB:July 20, 1966, 57 y.o., male Today's Date: 06/17/2023  END OF SESSION:  PT End of Session - 06/17/23 1529     Visit Number 10    Number of Visits 16    Date for PT Re-Evaluation 08/22/23    PT Start Time 1522   Patient arrived late to his appointment.   PT Stop Time 1605    PT Time Calculation (min) 43 min    Activity Tolerance Patient tolerated treatment well    Behavior During Therapy WFL for tasks assessed/performed                   Past Medical History:  Diagnosis Date   Depression    Diabetes mellitus without complication (HCC)    Hypertension    Spinal stenosis    With Neurogenic Claudication   Past Surgical History:  Procedure Laterality Date   CARPAL TUNNEL RELEASE Bilateral    INCISION AND DRAINAGE ABSCESS Left 03/12/2021   Procedure: INCISION AND DRAINAGE ABSCESS;  Surgeon: [name], MD;  Location: [facility];  Service: Orthopedics;  Laterality: Left;   IR FLUORO GUIDED NEEDLE PLC ASPIRATION/INJECTION LOC  04/24/2022   IR FLUORO GUIDED NEEDLE PLC ASPIRATION/INJECTION LOC  12/05/2022   IR FLUORO GUIDED NEEDLE PLC ASPIRATION/INJECTION LOC  03/05/2023   IR FLUORO GUIDED NEEDLE PLC ASPIRATION/INJECTION LOC  03/05/2023   IR US GUIDE BX ASP/DRAIN  02/02/2021   IRRIGATION AND DEBRIDEMENT ABSCESS Left 02/08/2021   Procedure: MINOR INCISION AND DRAINAGE OF ABSCESS;  Surgeon: [name], MD;  Location: [facility];  Service: Orthopedics;  Laterality: Left;   IRRIGATION AND DEBRIDEMENT ABSCESS Left 02/10/2021   Procedure: IRRIGATION AND DEBRIDEMENT ABSCESS LEFT LATERAL CHEST WALL;  Surgeon: [name], MD;  Location: [facility];  Service: General;  Laterality: Left;   IRRIGATION AND DEBRIDEMENT ABSCESS Bilateral 02/20/2021   Procedure: MINOR INCISION AND DRAINAGE OF ABSCESS/WITH BILATERAL WOUND VAC PLACEMENT;  Surgeon: [name], MD;  Location: [facility];  Service:  Orthopedics;  Laterality: Bilateral;   IRRIGATION AND DEBRIDEMENT SHOULDER Bilateral 02/28/2021   Procedure: IRRIGATION AND DEBRIDEMENT SHOULDER;  Surgeon: [name], MD;  Location: [facility];  Service: Orthopedics;  Laterality: Bilateral;   IRRIGATION AND DEBRIDEMENT SHOULDER Left 03/06/2021   Procedure: IRRIGATION AND DEBRIDEMENT SHOULDER;  Surgeon: [name], MD;  Location: [facility];  Service: Orthopedics;  Laterality: Left;   IRRIGATION AND DEBRIDEMENT SHOULDER Left 03/08/2021   Procedure: IRRIGATION AND DEBRIDEMENT SHOULDER;  Surgeon: [name], MD;  Location: [facility];  Service: Orthopedics;  Laterality: Left;   LUMBAR LAMINECTOMY FOR EPIDURAL ABSCESS N/A 05/02/2022   Procedure: THORACIC  LAMINECTOMY AND DEBRIDEMENT  FOR EPIDURAL ABSCESS;  Surgeon: [name], MD;  Location: [facility];  Service: Neurosurgery;  Laterality: N/A;   LUMBAR LAMINECTOMY/DECOMPRESSION MICRODISCECTOMY N/A 12/13/2014   Procedure: LUMBAR THREE-FOUR, LUMBAR FOUR-FIVE, LUMBAR FIVE-SACRAL ONE LAMINECTOMY;  Surgeon: [name], MD;  Location: [facility];  Service: Neurosurgery;  Laterality: N/A;  L3-4 L4-5 L5-S1 Laminectomy   LUMBAR LAMINECTOMY/DECOMPRESSION MICRODISCECTOMY Left 06/05/2021   Procedure: Left lumbar one-two, Lumbar two-three Laminectomy;  Surgeon: [name], MD;  Location: [facility];  Service: Neurosurgery;  Laterality: Left;   MENISCUS REPAIR     Right knee   MINOR APPLICATION OF WOUND VAC Left 02/20/2021   Procedure: MINOR APPLICATION OF KERLIX PACKING/REMOVAL OF WOUND VAC;  Surgeon: [name], MD;  Location: [facility];  Service: General;  Laterality: Left;  RADIOLOGY WITH ANESTHESIA N/A 02/25/2023   Procedure: MRI WITH ANESTHESIA OF THORACIC AND LUMBAR WITH AND WITHOUT CONTRAST;  Surgeon: Radiologist, Medication, MD;  Location: [facility];  Service: Radiology;  Laterality: N/A;   RADIOLOGY WITH ANESTHESIA N/A 03/05/2023   Procedure: disc aspiration;  Surgeon: Baldemar Lenis, MD;   Location: [facility] OR;  Service: Radiology;  Laterality: N/A;   TEE WITHOUT CARDIOVERSION N/A 02/05/2021   Procedure: TRANSESOPHAGEAL ECHOCARDIOGRAM (TEE);  Surgeon: Quintella Reichert, MD;  Location: [facility];  Service: Cardiovascular;  Laterality: N/A;   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   TOTAL SHOULDER ARTHROPLASTY Right 02/10/2021   Procedure: RIGHT OPEN SHOULDER I & D, OPEN DISTAL CLAVICLE RESECTION;  Surgeon: [name], MD;  Location: [facility];  Service: Orthopedics;  Laterality: Right;   VASECTOMY     Patient Active Problem List   Diagnosis Date Noted   PICC (peripherally inserted central catheter) in place 04/12/2023   Physical deconditioning 04/07/2023   Hypokalemia 02/22/2023   Chronic back pain 01/30/2023   Intractable back pain 12/04/2022   Medication management 12/04/2022   Vertebral osteomyelitis (HCC) 12/04/2022   Pseudomonas aeruginosa infection 07/07/2022   Osteomyelitis of thoracic region (HCC) 07/03/2022   Osteomyelitis of lumbar spine (HCC) 05/08/2022   Midline low back pain    Numbness    Osteomyelitis (HCC)    Chronic diastolic CHF (congestive heart failure) (HCC) 04/23/2022   Chronic pain 04/23/2022   Osteomyelitis of vertebra of thoracolumbar region (HCC) 04/22/2022   Presence of retained hardware 10/17/2021   Infective myositis 08/29/2021   Discitis of thoracolumbar region 07/31/2021   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   MRSA infection    Diabetic peripheral neuropathy (HCC)    Septic discitis of lumbar region 06/11/2021   Infection of spine (HCC) 05/28/2021   Medication monitoring encounter 05/09/2021   History of COVID-19    Psoas abscess (HCC)    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   Right shoulder pain 02/14/2021   Normocytic anemia 02/14/2021   Thrombocytosis 02/14/2021   Endocarditis 02/05/2021   Vision changes 02/04/2021   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   Pressure injury of skin 02/01/2021   HTN (hypertension)  02/01/2021   Anxiety 02/01/2021   Left shoulder pain 02/01/2021   Hyponatremia 01/31/2021   Positive colorectal cancer screening using Cologuard test 12/28/2020   DOE (dyspnea on exertion) 10/20/2020   Diabetic polyneuropathy associated with type 2 diabetes mellitus (HCC) 10/09/2020   DM2 (diabetes mellitus, type 2) (HCC) 10/30/2018   Lumbar stenosis with neurogenic claudication 10/14/2017   DDD (degenerative disc disease), lumbar 02/17/2017   Transient weakness of right lower extremity 02/05/2017   Hyperlipidemia associated with type 2 diabetes mellitus (HCC) 10/24/2016   Depression, recurrent (HCC) 10/24/2016   Right knee pain 05/21/2016   DDD (degenerative disc disease), lumbosacral 05/21/2016   Herniation of nucleus pulposus of cervical intervertebral disc without myelopathy 02/29/2016   Carpal tunnel syndrome 02/08/2016   Cervical spondylosis with myelopathy 01/17/2016   Lumbar stenosis 12/13/2014    REFERRING PROVIDER: Jannifer Rodney  REFERRING DIAG: Generalized weakness.  THERAPY DIAG:  Muscle weakness (generalized)  Unsteadiness on feet  Rationale for Evaluation and Treatment: Rehabilitation  ONSET DATE: Per patient "2022."  SUBJECTIVE:   SUBJECTIVE STATEMENT: Patient reports that he is a little sore today from working on his four wheeler yesterday. However, he feels like he is getting better and his balance is improving. He has noticed that he is able to  stand longer prior to having to sit down. He feels that he is about 25% better.   PERTINENT HISTORY: Please see above. PAIN:  Are you having pain? No  PRECAUTIONS: Fall.  High fall risk.  Please use gait belt and be with patient at all times.  WEIGHT BEARING RESTRICTIONS: No  FALLS:  Has patient fallen in last 6 months? No  LIVING ENVIRONMENT: Lives with: lives with their spouse Lives in: House/apartment Stairs:  Ramp. Has following equipment at home: Dan Humphreys - 2 wheeled  OCCUPATION:  Retired/Disabled.  PLOF: Independent with household mobility with device.  Using a FWW at home and also wheelchair.  PATIENT GOALS: He wants to walk independently again.  OBJECTIVE:    SENSATION: He c/o bilateral foot numbness.  POSTURE: rounded shoulders, forward head, and flexed trunk .  He stands in 30 degrees of trunk flexion and states he cannot straighten further.   LOWER EXTREMITY ROM:  His bilateral LE range of motion assessed in supine is WFL.  LOWER EXTREMITY MMT:  MMT Right eval Left eval  Hip flexion 4+/5 4+/5  Hip extension    Hip abduction 4+/5 4/5  Hip adduction    Hip internal rotation    Hip external rotation    Knee flexion 4+/5 4+/5  Knee extension 4+/5 4+/5  Ankle dorsiflexion 4+/5 4+/5  Ankle plantarflexion 4+/5 4+/5  Ankle inversion    Ankle eversion     (Blank rows = not tested)  FUNCTIONAL TESTS:  5 times sit to stand: 18 seconds Timed up and go (TUG): 38 seconds (with FWW).  DTR's:  Left Pat absent, Right 2+/4+, absent bilateral Achilles.  GAIT: The patient walks with a FWW (used gait belt).  His gait is axtaxic in nature and he walks with  stiff-legged gait pattern without knee flexion and sustained dorsiflexion.  TRANSFERS:  Sit to stand with armrest usage and supervision.  Sit to supine to sit with supervision.  TODAY'S TREATMENT:                                    06/17/23 EXERCISE LOG  Exercise Repetitions and Resistance Comments  Nustep L5-6 x 20 minutes    LAQ 5# x 2 minutes  Alternating LE  Seated marching  5# x 2 minutes  Alternating LE  Seated HS curl  Green t-band x 30 reps each         Blank cell = exercise not performed today                                    06/12/23 EXERCISE LOG  Exercise Repetitions and Resistance Comments  Nustep  L4-5 x 20 minutes   Static stance  3 minutes  Intermittent UE support (x1 minA from therapist)  LAQ 5# x 3 minutes    Seated marching  5# x 3 minutes    Standing marching  2.5  minutes  BUE support  Seated Hip Adduction 3 mins   Seated Ham Curls Green x 25 reps bil    Blank cell = exercise not performed today                                    06/05/23 EXERCISE LOG  Exercise Repetitions and Resistance Comments  Nustep  L4-5 x 20 minutes    Seated HS curl  Red t-band x 3 minutes each    LAQ 4# x 3.5 minutes Alternating LE  Seated hip flexion  4# x 3.5 minutes   Seated hip ADD isometric  3 minutes w/ 5 second hold    Rocker board (seated) 3 minutes     Blank cell = exercise not performed today   PATIENT EDUCATION:   Patient was educated on the benefits of weightbearing interventions, bracing, and healing. He reported understanding.  HOME EXERCISE PROGRAM:  ASSESSMENT:  CLINICAL IMPRESSION: Patient is making fair progress with skilled physical therapy as evidenced by his subjective reports, objective measures, functional mobility, and progress toward his goals. He was able to demonstrate a significant improvement in his five time sit to stand and timed up and go times. However, he continues to exhibit reduced knee flexion during gait bilaterally. Today's treatment focused on familiar interventions for improved lower extremity strengthening needed for improved mobility. He required minimal cueing with pacing of today's interventions to avoid a significant increase in fatigue. He reported feeling tired upon the conclusion of treatment. Recommend that he continue skilled physical therapy for an additional 4 visits to address his remaining impairments to maximize his functional mobility and safety.    OBJECTIVE IMPAIRMENTS: Abnormal gait, decreased activity tolerance, decreased balance, decreased coordination, difficulty walking, postural dysfunction, and pain.   ACTIVITY LIMITATIONS: carrying, lifting, bending, standing, squatting, stairs, bathing, dressing, and locomotion level  PARTICIPATION LIMITATIONS: meal prep, cleaning, medication management, shopping,  community activity, and yard work  PERSONAL FACTORS: Time since onset of injury/illness/exacerbation and 1-2 comorbidities: multiple spinal surgeries and infection  are also affecting patient's functional outcome.   REHAB POTENTIAL: Fair plus/good minus  CLINICAL DECISION MAKING: Evolving/moderate complexity  EVALUATION COMPLEXITY: Moderate   GOALS:  LONG TERM GOALS: Target date: 06/17/23  Ind with a HEP.  Baseline: patient reports that he is doing his HEP every other day  Goal status: IN PROGRESS  2.  Improve TUG with appropriate assistive device to 22 seconds   10/17: 30.26 seconds  06/17/23: 25.66 seconds with an rolling walker Goal status: IN PROGRESS  3.  5 time sit to stand performed in 13-14 seconds.   10/17: 10.38 seconds Goal status: MET  4.  Walk with appropriate assistive device 250 feet exhibiting normal knee flexion.   Baseline: continues to demonstrate limited bilateral knee flexion Goal status: IN PROGRESS  5.  Perform a reciprocating stair gait with one railing.  Goal status: IN PROGRESS  PLAN:  PT FREQUENCY: 2x/week  PT DURATION: 6 weeks  PLANNED INTERVENTIONS: Therapeutic exercises, Therapeutic activity, Neuromuscular re-education, Balance training, Gait training, Patient/Family education, Self Care, and Stair training  PLAN FOR NEXT SESSION: Nustep, gait and balance activities.  Neuro re-education.   Granville Lewis, PT 06/17/2023, 4:51 PM

## 2023-06-19 ENCOUNTER — Encounter: Payer: Medicare HMO | Admitting: *Deleted

## 2023-06-24 ENCOUNTER — Encounter: Payer: Self-pay | Admitting: Internal Medicine

## 2023-06-26 ENCOUNTER — Other Ambulatory Visit (HOSPITAL_COMMUNITY): Payer: Self-pay

## 2023-06-26 MED ORDER — MORPHINE SULFATE ER 30 MG PO TBCR
30.0000 mg | EXTENDED_RELEASE_TABLET | Freq: Two times a day (BID) | ORAL | 0 refills | Status: DC
Start: 2023-06-26 — End: 2023-07-23
  Filled 2023-06-26: qty 60, 30d supply, fill #0
  Filled 2023-07-01: qty 9, 5d supply, fill #0
  Filled 2023-07-01: qty 51, 25d supply, fill #0

## 2023-06-26 MED ORDER — OXYCODONE-ACETAMINOPHEN 10-325 MG PO TABS
1.0000 | ORAL_TABLET | Freq: Every day | ORAL | 0 refills | Status: DC
  Filled 2023-06-26: qty 150, 30d supply, fill #0

## 2023-06-27 ENCOUNTER — Telehealth: Payer: Self-pay | Admitting: Family

## 2023-07-01 ENCOUNTER — Other Ambulatory Visit (HOSPITAL_COMMUNITY): Payer: Self-pay

## 2023-07-03 ENCOUNTER — Encounter: Payer: Medicare HMO | Admitting: *Deleted

## 2023-07-10 ENCOUNTER — Other Ambulatory Visit (HOSPITAL_COMMUNITY): Payer: Self-pay | Admitting: Neurological Surgery

## 2023-07-10 DIAGNOSIS — M4620 Osteomyelitis of vertebra, site unspecified: Secondary | ICD-10-CM

## 2023-07-15 ENCOUNTER — Ambulatory Visit: Payer: Medicare HMO | Admitting: Infectious Diseases

## 2023-07-15 ENCOUNTER — Encounter (HOSPITAL_COMMUNITY): Payer: Self-pay

## 2023-07-15 ENCOUNTER — Ambulatory Visit (HOSPITAL_COMMUNITY): Payer: Medicare HMO

## 2023-07-23 ENCOUNTER — Other Ambulatory Visit (HOSPITAL_COMMUNITY): Payer: Self-pay

## 2023-07-23 MED ORDER — HYDROXYZINE HCL 10 MG PO TABS
10.0000 mg | ORAL_TABLET | Freq: Three times a day (TID) | ORAL | 2 refills | Status: DC
  Filled 2023-07-23: qty 90, 30d supply, fill #0
  Filled 2023-08-19: qty 90, 30d supply, fill #1

## 2023-07-23 MED ORDER — BACLOFEN 5 MG PO TABS
5.0000 mg | ORAL_TABLET | Freq: Three times a day (TID) | ORAL | 2 refills | Status: DC
  Filled 2023-07-23: qty 90, 30d supply, fill #0

## 2023-07-23 MED ORDER — MORPHINE SULFATE ER 30 MG PO TBCR
30.0000 mg | EXTENDED_RELEASE_TABLET | Freq: Two times a day (BID) | ORAL | 0 refills | Status: DC
  Filled 2023-07-23 – 2023-07-31 (×2): qty 60, 30d supply, fill #0

## 2023-07-23 MED ORDER — OXYCODONE-ACETAMINOPHEN 10-325 MG PO TABS
1.0000 | ORAL_TABLET | Freq: Every day | ORAL | 0 refills | Status: DC
  Filled 2023-07-23: qty 150, 30d supply, fill #0

## 2023-07-30 ENCOUNTER — Ambulatory Visit: Payer: Medicare HMO | Admitting: Infectious Diseases

## 2023-07-31 ENCOUNTER — Ambulatory Visit: Payer: Medicare HMO | Admitting: Infectious Diseases

## 2023-07-31 ENCOUNTER — Other Ambulatory Visit (HOSPITAL_COMMUNITY): Payer: Self-pay

## 2023-08-05 ENCOUNTER — Encounter: Payer: Self-pay | Admitting: Infectious Diseases

## 2023-08-05 ENCOUNTER — Ambulatory Visit: Payer: Medicare HMO | Admitting: Infectious Diseases

## 2023-08-05 ENCOUNTER — Other Ambulatory Visit (HOSPITAL_COMMUNITY): Payer: Self-pay

## 2023-08-05 ENCOUNTER — Other Ambulatory Visit: Payer: Self-pay

## 2023-08-05 VITALS — BP 136/89 | HR 77 | Resp 16 | Ht 70.0 in | Wt 189.0 lb

## 2023-08-05 DIAGNOSIS — M4624 Osteomyelitis of vertebra, thoracic region: Secondary | ICD-10-CM

## 2023-08-05 DIAGNOSIS — T148XXA Other injury of unspecified body region, initial encounter: Secondary | ICD-10-CM

## 2023-08-05 DIAGNOSIS — Z79899 Other long term (current) drug therapy: Secondary | ICD-10-CM | POA: Diagnosis not present

## 2023-08-05 MED ORDER — CIPROFLOXACIN HCL 750 MG PO TABS
750.0000 mg | ORAL_TABLET | Freq: Two times a day (BID) | ORAL | 1 refills | Status: DC
  Filled 2023-08-05: qty 60, 30d supply, fill #0
  Filled 2023-08-19: qty 10, 5d supply, fill #0
  Filled 2023-08-19: qty 50, 25d supply, fill #0
  Filled 2023-09-15: qty 60, 30d supply, fill #1
  Filled 2023-09-15: qty 56, 28d supply, fill #1
  Filled 2023-09-16: qty 4, 2d supply, fill #1

## 2023-08-05 MED ORDER — CEFADROXIL 500 MG PO CAPS
500.0000 mg | ORAL_CAPSULE | Freq: Two times a day (BID) | ORAL | 5 refills | Status: DC
  Filled 2023-08-05: qty 60, 30d supply, fill #0
  Filled 2023-08-19 – 2023-09-15 (×3): qty 60, 30d supply, fill #1
  Filled 2023-10-17: qty 60, 30d supply, fill #2
  Filled 2023-11-13: qty 60, 30d supply, fill #3
  Filled 2023-12-10: qty 60, 30d supply, fill #4

## 2023-08-05 NOTE — Progress Notes (Signed)
Patient Active Problem List   Diagnosis Date Noted   PICC (peripherally inserted central catheter) in place 04/12/2023   Physical deconditioning 04/07/2023   Hypokalemia 02/22/2023   Chronic back pain 01/30/2023   Intractable back pain 12/04/2022   Medication management 12/04/2022   Vertebral osteomyelitis (HCC) 12/04/2022   Pseudomonas aeruginosa infection 07/07/2022   Osteomyelitis of thoracic region [facility]) 07/03/2022   Osteomyelitis of lumbar spine (HCC) 05/08/2022   Midline low back pain    Numbness    Osteomyelitis (HCC)    Chronic diastolic CHF (congestive heart failure) (HCC) 04/23/2022   Chronic pain 04/23/2022   Osteomyelitis of vertebra of thoracolumbar region (HCC) 04/22/2022   Presence of retained hardware 10/17/2021   Infective myositis 08/29/2021   Discitis of thoracolumbar region 07/31/2021   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   MRSA infection    Diabetic peripheral neuropathy (HCC)    Septic discitis of lumbar region 06/11/2021   Infection of spine (HCC) 05/28/2021   Medication monitoring encounter 05/09/2021   History of COVID-19    Psoas abscess (HCC)    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   Right shoulder pain 02/14/2021   Normocytic anemia 02/14/2021   Thrombocytosis 02/14/2021   Endocarditis 02/05/2021   Vision changes 02/04/2021   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   Pressure injury of skin 02/01/2021   HTN (hypertension) 02/01/2021   Anxiety 02/01/2021   Left shoulder pain 02/01/2021   Hyponatremia 01/31/2021   Positive colorectal cancer screening using Cologuard test 12/28/2020   DOE (dyspnea on exertion) 10/20/2020   Diabetic polyneuropathy associated with type 2 diabetes mellitus (HCC) 10/09/2020   DM2 (diabetes mellitus, type 2) (HCC) 10/30/2018   Lumbar stenosis with neurogenic claudication 10/14/2017   DDD (degenerative disc disease), lumbar 02/17/2017   Transient weakness of right lower extremity 02/05/2017   Hyperlipidemia  associated with type 2 diabetes mellitus (HCC) 10/24/2016   Depression, recurrent (HCC) 10/24/2016   Right knee pain 05/21/2016   DDD (degenerative disc disease), lumbosacral 05/21/2016   Herniation of nucleus pulposus of cervical intervertebral disc without myelopathy 02/29/2016   Carpal tunnel syndrome 02/08/2016   Cervical spondylosis with myelopathy 01/17/2016   Lumbar stenosis 12/13/2014   Current Outpatient Medications on File Prior to Visit  Medication Sig Dispense Refill   acetaminophen (TYLENOL) 500 MG tablet Take 1 tablet (500 mg total) by mouth every 6 (six) hours as needed. 30 tablet 0   Baclofen 5 MG TABS Take 2 tablets (10 mg total) by mouth 3 (three) times daily. 90 tablet 2   Baclofen 5 MG TABS Take 1 tablet (5 mg total) by mouth 3 (three) times daily. 90 tablet 2   Baclofen 5 MG TABS Take 1 tablet (5 mg total) by mouth 3 (three) times daily. 90 tablet 2   buprenorphine (BUTRANS) 7.5 MCG/HR apply 1 patch to skin once a week 4 patch 0   cefadroxil (DURICEF) 500 MG capsule Take 1 capsule (500 mg total) by mouth 2 (two) times daily. 120 capsule 1   ciprofloxacin (CIPRO) 750 MG tablet Take 1 tablet (750 mg total) by mouth 2 (two) times daily. 60 tablet 1   Continuous Glucose Sensor (FREESTYLE LIBRE 2 SENSOR) MISC CHECK BLOOD SUGAR 4 TIMES A DAY 1 each 2   gabapentin (NEURONTIN) 300 MG capsule Take 1 capsule (300 mg total) by mouth 2 (two) times daily. 60 capsule 2   Gabapentin, Once-Daily, 300 MG TABS Take 1 tablet (300 mg total) by mouth daily. 30 tablet 2  hydrOXYzine (ATARAX) 10 MG tablet Take 1 tablet (10 mg total) by mouth 3 (three) times daily. 90 tablet 2   hydrOXYzine (ATARAX) 10 MG tablet Take 1 tablet (10 mg total) by mouth 3 (three) times daily. 90 tablet 2   morphine (KADIAN) 30 MG 24 hr capsule Take 1 capsule (30 mg total) by mouth every 12 (twelve) hours. 60 capsule 0   morphine (MS CONTIN) 30 MG 12 hr tablet Take 1 tablet (30 mg total) by mouth every 12 (twelve)  hours. 60 tablet 0   naloxone (NARCAN) 0.4 MG/ML injection Inject 1 mL (0.4 mg total) into the vein as needed. 1 mL 0   naloxone (NARCAN) nasal spray 4 mg/0.1 mL Use 1 spray in one nostril for single dose as needed. May repeat every 2-3 minutes until patient responsive or EMS arrives. 1 each 1   oxyCODONE-acetaminophen (PERCOCET) 10-325 MG tablet Take 1 tablet by mouth 5 times daily as needed. 95 tablet 0   oxyCODONE-acetaminophen (PERCOCET) 10-325 MG tablet Take 1 tablet by mouth five times a day 150 tablet 0   oxyCODONE-acetaminophen (PERCOCET) 10-325 MG tablet Take 1 tablet by mouth 5 (five) times daily. 15 tablet 0   oxyCODONE-acetaminophen (PERCOCET) 10-325 MG tablet Take 1 tablet by mouth 5 (five) times daily. 150 tablet 0   oxyCODONE-acetaminophen (PERCOCET) 10-325 MG tablet Take 1 tablet by mouth 5 (five) times daily. 150 tablet 0   oxyCODONE-acetaminophen (PERCOCET) 10-325 MG tablet Take 1 tablet by mouth 5 (five) times daily. 150 tablet 0   oxyCODONE-acetaminophen (PERCOCET) 10-325 MG tablet Take 1 tablet by mouth 5 (five) times daily. 150 tablet 0   No current facility-administered medications on file prior to visit.   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   Admitted on 04/22/2022 with inability to walk, BLE numbness, intermittent fevers and urinary  incontinence x3 weeks.  He was found to have acute discitis T9/10-T11/T12 with epidural phlegmon/abscess, septic arthritis T11-T12, extensive enhancement paraspinal soft tissue, as well as soft tissue abscess. 8/30 S/p fluoroscopy guided T1-11 disc aspiration ( Cx PsA), 8/31 aspiration of left back superficial abscess ( Cx PsA). Blood cx 8/28 1/2 sets staph pseudintermedius/epidermidis. This was followed by I&D of subcut abscess with decompression and debridement of osteomyelitis via T10-T11 laminectomy on 09/07 by [name] ( OR cx NG). Seen by ID and antibiotics switched to cefepime and planned for 6 weeks course EOT 10/19 with plan to continue Ciprofloxacin thereafter. Patient was also continued on doxycycline for hardware associated lumbar osteomyelitis. Patient was discharged to CIR due to functional decline on 9/12 and then discharged home from rehab on 9/22.   Res-started on ciprofloxacin 750mg  po bid on 11/8 due to concerns of persistent infection in the setting of elevated inflammatory markers and continued to take PO doxycycline as is without any concerns.    Admitted 4/9-4/15/24 with intractable back pain. 4/9 MRI L spine with discitis, osteomyelitis in lower T and Lumbar spine, epidural phlegmon. 5.9  x 2.0 x 2.0 cm fluid collection spanning the L4-L5 interspinous space, at the site of prior laminectomy, which could reflect a postoperative seroma, although abscess is not excluded.This is unchanged compared to 04/22/22. S/p IR aspiration 4/11,  cx grew MSSE. Discharged on cefazolin for 6 weeks. EOT 6/6 and transitioned to cefadroxil.   Admitted 6/29-7/11/24 for intractable back pain to get MRI.  MRI had to be performed with anesthesia on 7/2.  MRI findings with discitis/osteomyelitis of the lower T-spine progressed, phlegmon surrounding the thecal sac resulting in moderate narrowing of T10-T11 and severe at T11-T12 resulting in mass effect of the cord without cord signal abnormality, interval  resolution of the ventral epidural fluid collection at the L1 level. Per neurosurgery surgical intervention would be high risk and recommended conservative management.  S/p CT-guided aspiration on 7/10 under anesthesia, cultures with no growth.  Seen by ID and was discharged on IV cefepime for a total of 8 weeks, EOT 04/23/2023. Cefepime switched to PO ciprofloxacin and cefadroxil after completion of IV course.  Taking ciprofloxacin 750 mg po bid and cefadroxil 500mg  po bid  without missing doses or concerns. He is still unable to walk and stand independently.  08/05/23 Seen by Dr Danielle Dess 11/13 and MRI T spine has been ordered, reports he wants to do open MRI as he is claustrophobic. Reports compliance with cefadroxil and ciprofloxacin except missed 1-2 doses of abtx. Back pain has resolved, Stopped PT due to blister in the rt 4th toe. He is still not ambulatory and feels imbalanced when tries to stand without support. No  new concerns   Review of Systems: all systems reviewed with pertinent positives and negatives as listed above   Past Medical History:  Diagnosis Date   Depression    Diabetes mellitus without complication (HCC)    Hypertension    Spinal stenosis    With Neurogenic Claudication   Past Surgical History:  Procedure Laterality Date   CARPAL TUNNEL RELEASE Bilateral    INCISION AND DRAINAGE ABSCESS Left 03/12/2021   Procedure: INCISION AND DRAINAGE ABSCESS;  Surgeon: [name], MD;  Location: [facility];  Service: Orthopedics;  Laterality: Left;   IR FLUORO GUIDED NEEDLE PLC ASPIRATION/INJECTION LOC  04/24/2022   IR FLUORO GUIDED NEEDLE PLC ASPIRATION/INJECTION LOC  12/05/2022   IR FLUORO GUIDED NEEDLE PLC ASPIRATION/INJECTION LOC  03/05/2023   IR FLUORO GUIDED NEEDLE PLC ASPIRATION/INJECTION LOC  03/05/2023   IR US GUIDE BX ASP/DRAIN  02/02/2021   IRRIGATION AND DEBRIDEMENT ABSCESS Left 02/08/2021   Procedure: MINOR INCISION AND DRAINAGE OF ABSCESS;  Surgeon: [name], MD;   Location: [facility];  Service: Orthopedics;  Laterality: Left;   IRRIGATION AND DEBRIDEMENT ABSCESS Left 02/10/2021   Procedure: IRRIGATION AND DEBRIDEMENT ABSCESS LEFT LATERAL CHEST WALL;  Surgeon: [name], MD;  [facility]: [facility];  Service: General;  Laterality: Left;   IRRIGATION AND DEBRIDEMENT ABSCESS Bilateral 02/20/2021   Procedure: MINOR INCISION AND DRAINAGE OF ABSCESS/WITH BILATERAL WOUND VAC PLACEMENT;  Surgeon: [name], MD;  Location: [facility];  Service: Orthopedics;  Laterality: Bilateral;   IRRIGATION AND DEBRIDEMENT SHOULDER Bilateral 02/28/2021   Procedure: IRRIGATION AND DEBRIDEMENT SHOULDER;  Surgeon: [name], MD;  [facility]: [facility];  Service: Orthopedics;  Laterality: Bilateral;   IRRIGATION AND DEBRIDEMENT SHOULDER Left 03/06/2021   Procedure: IRRIGATION AND DEBRIDEMENT SHOULDER;  Surgeon: [name], MD;  [facility]: [facility];  Service: Orthopedics;  Laterality: Left;   IRRIGATION AND DEBRIDEMENT SHOULDER Left 03/08/2021   Procedure: IRRIGATION  AND DEBRIDEMENT SHOULDER;  Surgeon: [name], MD;  Location: [facility];  Service: Orthopedics;  Laterality: Left;   LUMBAR LAMINECTOMY FOR EPIDURAL ABSCESS N/A 05/02/2022   Procedure: THORACIC  LAMINECTOMY AND DEBRIDEMENT  FOR EPIDURAL ABSCESS;  Surgeon: [name], MD;  Location: [facility];  Service: Neurosurgery;  Laterality: N/A;   LUMBAR LAMINECTOMY/DECOMPRESSION MICRODISCECTOMY N/A 12/13/2014   Procedure: LUMBAR THREE-FOUR, LUMBAR FOUR-FIVE, LUMBAR FIVE-SACRAL ONE LAMINECTOMY;  Surgeon: [name], MD;  Location: [facility];  Service: Neurosurgery;  Laterality: N/A;  L3-4 L4-5 L5-S1 Laminectomy   LUMBAR LAMINECTOMY/DECOMPRESSION MICRODISCECTOMY Left 06/05/2021   Procedure: Left lumbar one-two, Lumbar two-three Laminectomy;  Surgeon: [name], MD;  Location: [facility];  Service: Neurosurgery;  Laterality: Left;   MENISCUS REPAIR     Right knee   MINOR APPLICATION OF WOUND VAC Left 02/20/2021   Procedure: MINOR APPLICATION  OF KERLIX PACKING/REMOVAL OF WOUND VAC;  Surgeon: [name], MD;  Location: [facility];  Service: General;  Laterality: Left;   RADIOLOGY WITH ANESTHESIA N/A 02/25/2023   Procedure: MRI WITH ANESTHESIA OF THORACIC AND LUMBAR WITH AND WITHOUT CONTRAST;  Surgeon: Radiologist, Medication, MD;  Location: [facility];  Service: Radiology;  Laterality: N/A;   RADIOLOGY WITH ANESTHESIA N/A 03/05/2023   Procedure: disc aspiration;  Surgeon: Baldemar Lenis, MD;  Location[facility] OR;  Service: Radiology;  Laterality: N/A;   TEE WITHOUT CARDIOVERSION N/A 02/05/2021   Procedure: TRANSESOPHAGEAL ECHOCARDIOGRAM (TEE);  Surgeon: Quintella Reichert, MD;  Location: [facility] ENDOSCOPY;  Service: Cardiovascular;  Laterality: N/A;   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   TOTAL SHOULDER ARTHROPLASTY Right 02/10/2021   Procedure: RIGHT OPEN SHOULDER I & D, OPEN DISTAL CLAVICLE RESECTION;  Surgeon: [name], MD;  Location: [facility];  Service: Orthopedics;  Laterality: Right;   VASECTOMY      Social History   Tobacco Use   Smoking status: Former    Current packs/day: 0.00    Average packs/day: 2.0 packs/day for 15.0 years (30.0 ttl pk-yrs)    Types: Cigarettes    Start date: 37    Quit date: 2012    Years since quitting: 12.9    Passive exposure: Never   Smokeless tobacco: Never  Vaping Use   Vaping status: Never Used  Substance Use Topics   Alcohol use: Yes    Comment: occasional   Drug use: No    Family History  Problem Relation Age of Onset   Cancer Mother    Colon cancer Neg Hx     Allergies  Allergen Reactions   Tizanidine Other (See Comments)    Leg cramps    Health Maintenance  Topic Date Due   Medicare Annual Wellness (AWV)  Never done   Zoster Vaccines- Shingrix (1 of 2) Never done   COVID-19 Vaccine (3 - Moderna risk series) 02/14/2020   FOOT EXAM  11/17/2022   OPHTHALMOLOGY EXAM  11/17/2022   Diabetic kidney evaluation - Urine ACR  08/17/2023   INFLUENZA VACCINE   11/24/2023 (Originally 03/27/2023)   HEMOGLOBIN A1C  08/25/2023   Fecal DNA (Cologuard)  11/03/2023   Lung Cancer Screening  12/03/2023   Diabetic kidney evaluation - eGFR measurement  05/26/2024   DTaP/Tdap/Td (2 - Td or Tdap) 08/14/2024   Hepatitis C Screening  Completed   HIV Screening  Completed   HPV VACCINES  Aged Out    Objective: BP 136/89   Pulse 77   Resp 16   Ht 5'  10" (1.778 m)   Wt 189 lb (85.7 kg)   SpO2 94%   BMI 27.12 kg/m   Physical Exam Constitutional:  older than stated age, malnourished   HENT:     Head: Normocephalic and atraumatic.      Mouth: Mucous membranes are moist.  Eyes:    Conjunctiva/sclera: Conjunctivae normal.     Pupils:   Cardiovascular:     Rate and Rhythm: Normal rate and regular rhythm.     Heart sounds:  Pulmonary:     Effort: Pulmonary effort is normal.     Breath sounds:   Abdominal:     General: Non distended     Palpations:   Musculoskeletal:        General: sitting in a wheel chair and needs support to stand.  No changes in power of lower extremities   Skin:    General: Skin is warm and dry.     Comments: Superficial ruptured blister in the rt 4th toes, does not appear to be infected      Neurological:     General:     Mental Status: awake, alert and oriented to person, place, and time.   Psychiatric:        Mood and Affect: Mood normal.   Lab Results Lab Results  Component Value Date   WBC 6.7 05/27/2023   HGB 14.3 05/27/2023   HCT 42.2 05/27/2023   MCV 82.3 05/27/2023   PLT 319 05/27/2023    Lab Results  Component Value Date   CREATININE 0.70 05/27/2023   BUN 12 05/27/2023   NA 138 05/27/2023   K 4.4 05/27/2023   CL 97 (L) 05/27/2023   CO2 25 05/27/2023    Lab Results  Component Value Date   ALT 15 02/23/2023   AST 17 02/23/2023   ALKPHOS 85 02/23/2023   BILITOT 0.9 02/23/2023    Lab Results  Component Value Date   CHOL 146 11/08/2021   HDL 43 11/08/2021   LDLCALC 85 11/08/2021   TRIG  97 11/08/2021   CHOLHDL 3.4 11/08/2021   No results found for: "LABRPR", "RPRTITER" No results found for: "HIV1RNAQUANT", "HIV1RNAVL", "CD4TABS"   Microbiology  Results for orders placed or performed during the hospital encounter of 02/22/23  Culture, blood (Routine X 2) w Reflex to ID Panel     Status: None   Collection Time: 02/23/23  7:38 AM   Specimen: BLOOD  Result Value Ref Range Status   Specimen Description BLOOD SITE NOT SPECIFIED  Final   Special Requests   Final    BOTTLES DRAWN AEROBIC AND ANAEROBIC Blood Culture adequate volume   Culture   Final    NO GROWTH 5 DAYS Performed at [facility], [address] [place], [place] [address]    Report Status 02/28/2023 FINAL  Final  Culture, blood (Routine X 2) w Reflex to ID Panel     Status: None   Collection Time: 02/23/23  7:40 AM   Specimen: BLOOD  Result Value Ref Range Status   Specimen Description BLOOD SITE NOT SPECIFIED  Final   Special Requests   Final    BOTTLES DRAWN AEROBIC AND ANAEROBIC Blood Culture adequate volume   Culture   Final    NO GROWTH 5 DAYS Performed at [facility], [address], [place], [place] [address]    Report Status 02/28/2023 FINAL  Final  Aerobic/Anaerobic Culture w Gram Stain (surgical/deep wound)     Status: None  Collection Time: 03/05/23 10:04 AM   Specimen: Fine Needle Aspirate  Result Value Ref Range Status   Specimen Description NEEDLE ASPIRATE  Final   Special Requests NONE  Final   Gram Stain   Final    FEW WBC PRESENT, PREDOMINANTLY PMN NO ORGANISMS SEEN    Culture   Final    No growth aerobically or anaerobically. Performed at [facility], [address] [place], [place] [address]    Report Status 03/10/2023 FINAL  Final  Aerobic/Anaerobic Culture w Gram Stain (surgical/deep wound)     Status: None   Collection Time: 03/05/23 10:07 AM   Specimen: Fine Needle Aspirate  Result Value Ref Range Status   Specimen Description NEEDLE ASPIRATE   Final   Special Requests  IDISC  Final   Gram Stain   Final    RARE WBC PRESENT, PREDOMINANTLY PMN NO ORGANISMS SEEN    Culture   Final    No growth aerobically or anaerobically. Performed at [facility], [address] [place], [place] [address]    Report Status 03/10/2023 FINAL  Final  Acid Fast Smear (AFB)     Status: None   Collection Time: 03/05/23 10:09 AM   Specimen: Intervertebral Disc; Fine Needle Aspirate  Result Value Ref Range Status   AFB Specimen Processing Concentration  Final   Acid Fast Smear Negative  Final    Comment: (NOTE) Performed At: [facility] [address] [place], [place] [id number] Jolene Schimke MD [id number]    Source (AFB) NEEDLE ASPIRATE  Final    Comment: Performed at [facility], [address] [place], [place] [address]  Acid Fast Culture with reflexed sensitivities     Status: None   Collection Time: 03/05/23 10:09 AM   Specimen: Intervertebral Disc; Fine Needle Aspirate  Result Value Ref Range Status   Acid Fast Culture Negative  Final    Comment: (NOTE) No acid fast bacilli isolated after 6 weeks. Performed At: [facility] [address] [name], [place] [id number] Jolene Schimke MD [id number]    Source of Sample NEEDLE ASPIRATE  Final    Comment: Performed at [facility], [address] [place], [place] [address]  Fungus Culture With Stain     Status: None   Collection Time: 03/05/23 10:09 AM   Specimen: Intervertebral Disc  Result Value Ref Range Status   Fungus Stain Final report  Final   Fungus (Mycology) Culture Final report  Final    Comment: (NOTE) Performed At: [facility] [address] [place], [place] [id number] Jolene Schimke MD [id number]    Fungal Source NEEDLE ASPIRATE  Final    Comment: Performed at [facility], [address] [place], [place] [address]  Fungus Culture Result     Status: None   Collection Time: 03/05/23 10:09 AM  Result Value  Ref Range Status   Result 1 Comment  Final    Comment: (NOTE) KOH/Calcofluor preparation:  no fungus observed. Performed At: [facility] [address] [place], [place] [id number] Jolene Schimke MD [id number]   Fungal organism reflex     Status: None   Collection Time: 03/05/23 10:09 AM  Result Value Ref Range Status   Fungal result 1 Comment  Final    Comment: (NOTE) No yeast or mold isolated after 4 weeks. Performed At: [facility] [address] [place], [place] [id number] Jolene Schimke MD [id number]   Fungus Culture With Stain  Status: None   Collection Time: 03/05/23 12:25 PM  Result Value Ref Range Status   Fungus Stain Final report  Final   Fungus (Mycology) Culture Final report  Final    Comment: (NOTE) Performed At: [facility] [address] [place], [place] [id number] Jolene Schimke MD FA:[id number]    Fungal Source NEEDLE ASPIRATE  Final    Comment: Performed at [facility], [address], [place], [place] [address]  Acid Fast Smear (AFB)     Status: None   Collection Time: 03/05/23 12:25 PM   Specimen: Intervertebral Disc; Fine Needle Aspirate  Result Value Ref Range Status   AFB Specimen Processing Concentration  Final   Acid Fast Smear Negative  Final    Comment: (NOTE) Performed At: [facility] [address] [place], [place] [id number] Jolene Schimke MD [id number]    Source (AFB) NEEDLE ASPIRATE  Final    Comment: Performed at [facility], [address] [place], [place] [address]  Acid Fast Culture with reflexed sensitivities     Status: None   Collection Time: 03/05/23 12:25 PM   Specimen: Intervertebral Disc; Fine Needle Aspirate  Result Value Ref Range Status   Acid Fast Culture Negative  Final    Comment: (NOTE) No acid fast bacilli isolated after 6 weeks. Performed At: [facility] [address] [place], [place] [id number] Jolene Schimke MD [id number]    Source of  Sample NEEDLE ASPIRATE  Final    Comment: Performed at [facility], [address] [place], [place] [address]  Fungus Culture Result     Status: None   Collection Time: 03/05/23 12:25 PM  Result Value Ref Range Status   Result 1 Comment  Final    Comment: (NOTE) KOH/Calcofluor preparation:  no fungus observed. Performed At: [facility] [address] [place], [place] [id number] Jolene Schimke MD [id number]   Fungal organism reflex     Status: None   Collection Time: 03/05/23 12:25 PM  Result Value Ref Range Status   Fungal result 1 Comment  Final    Comment: (NOTE) No yeast or mold isolated after 4 weeks. Performed At: [facility] [address] [place], [place] [id number] Jolene Schimke MD [id number]    Imaging MRI L spine 02/25/23 IMPRESSION: 1. Findings consistent with discitis/osteomyelitis of the lower thoracic spine, progressed since prior thoracic MRI (September 2023). The T11-12 level, which was included on prior MRI of the lumbar spine (May 2024) appear stable. 2. Phlegmon surrounding the thecal sac results in moderate narrowing at T10-11 and severe at T11-12, resulting in mass effect on the cord without cord signal abnormality. 3. Interval resolution of ventral epidural fluid collection at the L1 level. 4. Degenerative changes of the thoracic spine with moderate spinal canal stenosis at T5-6. 5. Degenerative changes of the lumbar spine with moderate bilateral neural foraminal narrowing at L1-2 and L2-3 and moderate left neural foraminal narrowing at L5-S1.   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 Plan to continue ciprofloxacin for at least 6-12 months, cefadroxil indefinitely  -Labs today  - Fu MRI T spine as well as neurosurgery as instructed -Fu in 2 month   # Blister in rt 4th toe - ruptured, superficial, no signs of  infection  - monitor for any signs of infection, h/o DM  # Medication Monitoring  -labs today -EKG today w qtc 421  I have personally spent 32 minutes involved in face-to-face and non-face-to-face activities for this patient on the day of the visit. Professional time spent includes the following activities: Preparing to see the patient (review of tests), Obtaining and/or reviewing separately obtained history (admission/discharge record), Performing a medically appropriate examination and/or evaluation , Ordering medications/tests/procedures, referring and communicating with other health care professionals, Documenting clinical information in the EMR, Independently interpreting results (not separately reported), Communicating results to the patient/family/caregiver, Counseling and educating the patient/family/caregiver and Care coordination (not separately reported).   Victoriano Lain, MD [facility] 08/05/2023, 1:38 PM

## 2023-08-06 ENCOUNTER — Telehealth: Payer: Self-pay

## 2023-08-06 LAB — BASIC METABOLIC PANEL
BUN: 10 mg/dL (ref 7–25)
CO2: 25 mmol/L (ref 20–32)
Calcium: 9.7 mg/dL (ref 8.6–10.3)
Chloride: 105 mmol/L (ref 98–110)
Creat: 0.85 mg/dL (ref 0.70–1.30)
Glucose, Bld: 117 mg/dL — ABNORMAL HIGH (ref 65–99)
Potassium: 4.4 mmol/L (ref 3.5–5.3)
Sodium: 143 mmol/L (ref 135–146)

## 2023-08-06 LAB — CBC
HCT: 42.3 % (ref 38.5–50.0)
Hemoglobin: 14.1 g/dL (ref 13.2–17.1)
MCH: 28.4 pg (ref 27.0–33.0)
MCHC: 33.3 g/dL (ref 32.0–36.0)
MCV: 85.3 fL (ref 80.0–100.0)
MPV: 9.5 fL (ref 7.5–12.5)
Platelets: 338 10*3/uL (ref 140–400)
RBC: 4.96 10*6/uL (ref 4.20–5.80)
RDW: 14.9 % (ref 11.0–15.0)
WBC: 8.4 10*3/uL (ref 3.8–10.8)

## 2023-08-06 LAB — C-REACTIVE PROTEIN: CRP: 3.1 mg/L (ref <8.0)

## 2023-08-06 LAB — SEDIMENTATION RATE: Sed Rate: 9 mm/h (ref 0–20)

## 2023-08-06 NOTE — Telephone Encounter (Signed)
-----   Message from Victoriano Lain sent at 08/06/2023  8:41 AM EST ----- Please let patient know lab work is negative for any significant abnormality.  Continue antibiotics as discussed during clinic visit. No new recs.

## 2023-08-08 NOTE — Telephone Encounter (Addendum)
John Ware called front desk and requested return call from triage. Called and spoke with John Ware, relayed per Dr. Elinor Parkinson that labs showed no significant abnormalities and that he should continue with antibiotics as discussed. Patient verbalized understanding and has no further questions.   [name], RN

## 2023-08-08 NOTE — Telephone Encounter (Signed)
Patient has not read MyChart message, called to discuss results, no answer. Left HIPAA compliant voicemail requesting callback.   [name], RN

## 2023-08-19 ENCOUNTER — Other Ambulatory Visit (HOSPITAL_COMMUNITY): Payer: Self-pay

## 2023-08-19 ENCOUNTER — Other Ambulatory Visit: Payer: Self-pay

## 2023-08-19 MED ORDER — GABAPENTIN 300 MG PO CAPS
300.0000 mg | ORAL_CAPSULE | Freq: Two times a day (BID) | ORAL | 2 refills | Status: DC
  Filled 2023-08-19: qty 60, 30d supply, fill #0
  Filled 2023-10-16: qty 60, 30d supply, fill #1
  Filled 2023-11-13: qty 60, 30d supply, fill #2

## 2023-08-19 MED ORDER — MORPHINE SULFATE ER 30 MG PO TBCR
30.0000 mg | EXTENDED_RELEASE_TABLET | Freq: Two times a day (BID) | ORAL | 0 refills | Status: DC
  Filled 2023-08-30: qty 60, 30d supply, fill #0

## 2023-08-19 MED ORDER — OXYCODONE-ACETAMINOPHEN 10-325 MG PO TABS
1.0000 | ORAL_TABLET | Freq: Every day | ORAL | 0 refills | Status: DC
  Filled 2023-08-19: qty 150, 30d supply, fill #0

## 2023-08-26 ENCOUNTER — Other Ambulatory Visit (HOSPITAL_COMMUNITY): Payer: Self-pay

## 2023-08-28 ENCOUNTER — Other Ambulatory Visit (HOSPITAL_COMMUNITY): Payer: Self-pay

## 2023-08-29 ENCOUNTER — Telehealth: Payer: Self-pay | Admitting: Family

## 2023-08-30 ENCOUNTER — Other Ambulatory Visit (HOSPITAL_COMMUNITY): Payer: Self-pay

## 2023-09-11 ENCOUNTER — Ambulatory Visit (INDEPENDENT_AMBULATORY_CARE_PROVIDER_SITE_OTHER): Payer: Medicare HMO | Admitting: Family

## 2023-09-11 ENCOUNTER — Encounter: Payer: Self-pay | Admitting: Family

## 2023-09-11 VITALS — BP 137/74 | HR 77 | Temp 98.0°F | Ht 70.0 in

## 2023-09-11 DIAGNOSIS — E1142 Type 2 diabetes mellitus with diabetic polyneuropathy: Secondary | ICD-10-CM

## 2023-09-11 DIAGNOSIS — Z0001 Encounter for general adult medical examination with abnormal findings: Secondary | ICD-10-CM

## 2023-09-11 DIAGNOSIS — F419 Anxiety disorder, unspecified: Secondary | ICD-10-CM

## 2023-09-11 DIAGNOSIS — F339 Major depressive disorder, recurrent, unspecified: Secondary | ICD-10-CM | POA: Diagnosis not present

## 2023-09-11 DIAGNOSIS — M51372 Other intervertebral disc degeneration, lumbosacral region with discogenic back pain and lower extremity pain: Secondary | ICD-10-CM

## 2023-09-11 DIAGNOSIS — Z0000 Encounter for general adult medical examination without abnormal findings: Secondary | ICD-10-CM

## 2023-09-11 DIAGNOSIS — E1169 Type 2 diabetes mellitus with other specified complication: Secondary | ICD-10-CM

## 2023-09-11 DIAGNOSIS — I10 Essential (primary) hypertension: Secondary | ICD-10-CM

## 2023-09-11 DIAGNOSIS — E785 Hyperlipidemia, unspecified: Secondary | ICD-10-CM

## 2023-09-11 DIAGNOSIS — N522 Drug-induced erectile dysfunction: Secondary | ICD-10-CM

## 2023-09-11 DIAGNOSIS — G8929 Other chronic pain: Secondary | ICD-10-CM

## 2023-09-11 LAB — BAYER DCA HB A1C WAIVED: HB A1C (BAYER DCA - WAIVED): 6.5 % — ABNORMAL HIGH (ref 4.8–5.6)

## 2023-09-11 MED ORDER — FREESTYLE LIBRE 3 SENSOR MISC: 1.0000 | 6 refills | Status: DC

## 2023-09-11 MED ORDER — SILDENAFIL CITRATE 100 MG PO TABS: 50.0000 mg | ORAL_TABLET | Freq: Every day | ORAL | 6 refills | Status: DC | PRN

## 2023-09-11 NOTE — Progress Notes (Signed)
Subjective:    Patient ID: John Ware, male    DOB: 1966/02/16, 58 y.o.   MRN: [id number]  Chief Complaint  Patient presents with   Medical Management of Chronic Issues   PT presents to the office today for CPE and chronic follow up. He is followed by Pain Clinic every month for chronic back pain. Has hx of lumbar stenosis.    He is followed by ID for psoas muscle abscess. He is currently taking cipro BID and cefadroxil daily.  He is followed by Ortho as needed.   He is complaining of ED and requesting medication today.  Hypertension This is a chronic problem. The current episode started more than 1 year ago. The problem has been waxing and waning since onset. The problem is uncontrolled. Associated symptoms include anxiety, malaise/fatigue and peripheral edema. Pertinent negatives include no blurred vision or shortness of breath. Risk factors for coronary artery disease include dyslipidemia, male gender and sedentary lifestyle. The current treatment provides moderate improvement. Hypertensive end-organ damage includes heart failure.  Congestive Heart Failure Presents for follow-up visit. Associated symptoms include fatigue. Pertinent negatives include no edema or shortness of breath. The symptoms have been stable.  Hyperlipidemia This is a chronic problem. The current episode started more than 1 year ago. The problem is controlled. Recent lipid tests were reviewed and are normal. Pertinent negatives include no shortness of breath. Current antihyperlipidemic treatment includes diet change. The current treatment provides moderate improvement of lipids. Risk factors for coronary artery disease include dyslipidemia, hypertension, male sex and a sedentary lifestyle.  Diabetes He presents for his follow-up diabetic visit. He has type 2 diabetes mellitus. Hypoglycemia symptoms include nervousness/anxiousness. Associated symptoms include fatigue and foot paresthesias. Pertinent negatives for  diabetes include no blurred vision. Symptoms are stable. Risk factors for coronary artery disease include dyslipidemia, diabetes mellitus, hypertension, male sex and sedentary lifestyle. He is following a generally healthy diet. His overall blood glucose range is 130-140 mg/dl. Eye exam is current.  Anemia Presents for follow-up visit. Symptoms include malaise/fatigue. Past medical history includes heart failure.  Back Pain This is a chronic problem. The current episode started more than 1 year ago. The problem occurs intermittently. The problem has been waxing and waning since onset. The pain is present in the lumbar spine. The quality of the pain is described as aching. The pain is at a severity of 2/10. The pain is mild. He has tried analgesics for the symptoms. The treatment provided moderate relief.  Depression        This is a chronic problem.  The current episode started more than 1 year ago.   The onset quality is gradual.   The problem occurs intermittently.  Associated symptoms include fatigue, helplessness, hopelessness, decreased interest and sad.  Past medical history includes anxiety.   Anxiety Presents for follow-up visit. Symptoms include excessive worry, irritability and nervous/anxious behavior. Patient reports no shortness of breath. The severity of symptoms is moderate.        Review of Systems  Constitutional:  Positive for fatigue, irritability and malaise/fatigue.  Eyes:  Negative for blurred vision.  Respiratory:  Negative for shortness of breath.   Musculoskeletal:  Positive for back pain.  Psychiatric/Behavioral:  The patient is nervous/anxious.   All other systems reviewed and are negative.      Objective:   Physical Exam Vitals reviewed.  Constitutional:      General: He is not in acute distress.    Appearance: He is underweight.  Comments: Body appearance  HENT:     Head: Normocephalic.     Right Ear: Tympanic membrane normal.     Left Ear: Tympanic  membrane normal.  Eyes:     General:        Right eye: No discharge.        Left eye: No discharge.     Pupils: Pupils are equal, round, and reactive to light.  Neck:     Thyroid: No thyromegaly.  Cardiovascular:     Rate and Rhythm: Normal rate and regular rhythm.     Heart sounds: Normal heart sounds. No murmur heard. Pulmonary:     Effort: Pulmonary effort is normal. No respiratory distress.     Breath sounds: Normal breath sounds. No wheezing.  Abdominal:     General: Bowel sounds are normal. There is no distension.     Palpations: Abdomen is soft.     Tenderness: There is no abdominal tenderness.  Musculoskeletal:        General: No tenderness. Normal range of motion.     Cervical back: Normal range of motion and neck supple.  Skin:    General: Skin is warm and dry.     Coloration: Skin is pale.     Findings: No erythema or rash.  Neurological:     Mental Status: He is alert and oriented to person, place, and time.     Cranial Nerves: No cranial nerve deficit.     Motor: Weakness (in wheelchair) present.     Gait: Gait abnormal.     Deep Tendon Reflexes: Reflexes are normal and symmetric.  Psychiatric:        Behavior: Behavior normal.        Thought Content: Thought content normal.        Judgment: Judgment normal.       BP (!) 168/93   Pulse 77   Temp 98 F (36.7 C) (Temporal)   Ht 5\' 10"  (1.778 m)   SpO2 93%   BMI 27.12 kg/m      Assessment & Plan:  John Ware comes in today with chief complaint of Medical Management of Chronic Issues   Diagnosis and orders addressed: 1. Degeneration of intervertebral disc of lumbosacral region with discogenic back pain and lower extremity pain  2. Hyperlipidemia associated with type 2 diabetes mellitus (HCC) - Lipid panel  3. Depression, recurrent (HCC)  4. Type 2 diabetes mellitus with other specified complication, without long-term current use of insulin (HCC) - Continuous Glucose Sensor (FREESTYLE LIBRE  3 SENSOR) MISC; 1 Device by Does not apply route every 14 (fourteen) days. Place 1 sensor on the skin every 14 days. Use to check glucose continuously  Dispense: 2 each; Refill: 6 - Bayer DCA Hb A1c Waived - Microalbumin / creatinine urine ratio  5. Diabetic polyneuropathy associated with type 2 diabetes mellitus (HCC)   6. Primary hypertension  7. Anxiety  8. Other chronic pain   9. Drug-induced erectile dysfunction - sildenafil (VIAGRA) 100 MG tablet; Take 0.5-1 tablets (50-100 mg total) by mouth daily as needed for erectile dysfunction.  Dispense: 30 tablet; Refill: 6  10. Annual physical exam (Primary) - Bayer DCA Hb A1c Waived - Lipid panel - Microalbumin / creatinine urine ratio  Labs pending Will start Viagra as needed, Goodrx coupon given  Continue current medications  Keep follow up with specialists  Health Maintenance reviewed Diet and exercise encouraged  Follow up plan: 4 months    Jannifer Rodney, FNP

## 2023-09-11 NOTE — Patient Instructions (Signed)
Health Maintenance, Male Adopting a healthy lifestyle and getting preventive care are important in promoting health and wellness. Ask your health care provider about: The right schedule for you to have regular tests and exams. Things you can do on your own to prevent diseases and keep yourself healthy. What should I know about diet, weight, and exercise? Eat a healthy diet  Eat a diet that includes plenty of vegetables, fruits, low-fat dairy products, and lean protein. Do not eat a lot of foods that are high in solid fats, added sugars, or sodium. Maintain a healthy weight Body mass index (BMI) is a measurement that can be used to identify possible weight problems. It estimates body fat based on height and weight. Your health care provider can help determine your BMI and help you achieve or maintain a healthy weight. Get regular exercise Get regular exercise. This is one of the most important things you can do for your health. Most adults should: Exercise for at least 150 minutes each week. The exercise should increase your heart rate and make you sweat (moderate-intensity exercise). Do strengthening exercises at least twice a week. This is in addition to the moderate-intensity exercise. Spend less time sitting. Even light physical activity can be beneficial. Watch cholesterol and blood lipids Have your blood tested for lipids and cholesterol at 57 years of age, then have this test every 5 years. You may need to have your cholesterol levels checked more often if: Your lipid or cholesterol levels are high. You are older than 57 years of age. You are at high risk for heart disease. What should I know about cancer screening? Many types of cancers can be detected early and may often be prevented. Depending on your health history and family history, you may need to have cancer screening at various ages. This may include screening for: Colorectal cancer. Prostate cancer. Skin cancer. Lung  cancer. What should I know about heart disease, diabetes, and high blood pressure? Blood pressure and heart disease High blood pressure causes heart disease and increases the risk of stroke. This is more likely to develop in people who have high blood pressure readings or are overweight. Talk with your health care provider about your target blood pressure readings. Have your blood pressure checked: Every 3-5 years if you are 18-39 years of age. Every year if you are 40 years old or older. If you are between the ages of 65 and 75 and are a current or former smoker, ask your health care provider if you should have a one-time screening for abdominal aortic aneurysm (AAA). Diabetes Have regular diabetes screenings. This checks your fasting blood sugar level. Have the screening done: Once every three years after age 45 if you are at a normal weight and have a low risk for diabetes. More often and at a younger age if you are overweight or have a high risk for diabetes. What should I know about preventing infection? Hepatitis B If you have a higher risk for hepatitis B, you should be screened for this virus. Talk with your health care provider to find out if you are at risk for hepatitis B infection. Hepatitis C Blood testing is recommended for: Everyone born from 1945 through 1965. Anyone with known risk factors for hepatitis C. Sexually transmitted infections (STIs) You should be screened each year for STIs, including gonorrhea and chlamydia, if: You are sexually active and are younger than 57 years of age. You are older than 57 years of age and your   health care provider tells you that you are at risk for this type of infection. Your sexual activity has changed since you were last screened, and you are at increased risk for chlamydia or gonorrhea. Ask your health care provider if you are at risk. Ask your health care provider about whether you are at high risk for HIV. Your health care provider  may recommend a prescription medicine to help prevent HIV infection. If you choose to take medicine to prevent HIV, you should first get tested for HIV. You should then be tested every 3 months for as long as you are taking the medicine. Follow these instructions at home: Alcohol use Do not drink alcohol if your health care provider tells you not to drink. If you drink alcohol: Limit how much you have to 0-2 drinks a day. Know how much alcohol is in your drink. In the [place] one drink equals one 12 oz bottle of beer (355 mL), one 5 oz glass of wine (148 mL), or one 1 oz glass of hard liquor (44 mL). Lifestyle Do not use any products that contain nicotine or tobacco. These products include cigarettes, chewing tobacco, and vaping devices, such as e-cigarettes. If you need help quitting, ask your health care provider. Do not use street drugs. Do not share needles. Ask your health care provider for help if you need support or information about quitting drugs. General instructions Schedule regular health, dental, and eye exams. Stay current with your vaccines. Tell your health care provider if: You often feel depressed. You have ever been abused or do not feel safe at home. Summary Adopting a healthy lifestyle and getting preventive care are important in promoting health and wellness. Follow your health care provider's instructions about healthy diet, exercising, and getting tested or screened for diseases. Follow your health care provider's instructions on monitoring your cholesterol and blood pressure. This information is not intended to replace advice given to you by your health care provider. Make sure you discuss any questions you have with your health care provider. Document Revised: 01/01/2021 Document Reviewed: 01/01/2021 [employer] Patient Education  2024 [employer]  

## 2023-09-12 ENCOUNTER — Other Ambulatory Visit: Payer: Self-pay | Admitting: Family

## 2023-09-12 LAB — LIPID PANEL
Chol/HDL Ratio: 2.5 {ratio} (ref 0.0–5.0)
Cholesterol, Total: 145 mg/dL (ref 100–199)
HDL: 59 mg/dL (ref >39)
LDL Chol Calc (NIH): 68 mg/dL (ref 0–99)
Triglycerides: 100 mg/dL (ref 0–149)
VLDL Cholesterol Cal: 18 mg/dL (ref 5–40)

## 2023-09-12 LAB — MICROALBUMIN / CREATININE URINE RATIO
Creatinine, Urine: 76.7 mg/dL
Microalb/Creat Ratio: 21 mg/g{creat} (ref 0–29)
Microalbumin, Urine: 16 ug/mL

## 2023-09-12 MED ORDER — ATORVASTATIN CALCIUM 20 MG PO TABS: 20.0000 mg | ORAL_TABLET | Freq: Every day | ORAL | 3 refills | Status: DC

## 2023-09-15 ENCOUNTER — Other Ambulatory Visit: Payer: Self-pay

## 2023-09-15 ENCOUNTER — Other Ambulatory Visit (HOSPITAL_COMMUNITY): Payer: Self-pay

## 2023-09-15 ENCOUNTER — Other Ambulatory Visit (HOSPITAL_BASED_OUTPATIENT_CLINIC_OR_DEPARTMENT_OTHER): Payer: Self-pay

## 2023-09-16 ENCOUNTER — Other Ambulatory Visit (HOSPITAL_COMMUNITY): Payer: Self-pay

## 2023-09-16 ENCOUNTER — Other Ambulatory Visit: Payer: Self-pay

## 2023-09-18 ENCOUNTER — Other Ambulatory Visit (HOSPITAL_COMMUNITY): Payer: Self-pay

## 2023-09-18 MED ORDER — MORPHINE SULFATE ER 30 MG PO TBCR
30.0000 mg | EXTENDED_RELEASE_TABLET | Freq: Two times a day (BID) | ORAL | 0 refills | Status: DC
  Filled 2023-09-18 – 2023-09-29 (×3): qty 60, 30d supply, fill #0

## 2023-09-18 MED ORDER — OXYCODONE-ACETAMINOPHEN 10-325 MG PO TABS
1.0000 | ORAL_TABLET | Freq: Every day | ORAL | 0 refills | Status: DC
  Filled 2023-09-18: qty 150, 30d supply, fill #0

## 2023-09-23 ENCOUNTER — Other Ambulatory Visit (HOSPITAL_COMMUNITY): Payer: Self-pay

## 2023-09-29 ENCOUNTER — Other Ambulatory Visit: Payer: Self-pay

## 2023-09-29 ENCOUNTER — Other Ambulatory Visit (HOSPITAL_COMMUNITY): Payer: Self-pay

## 2023-10-07 ENCOUNTER — Ambulatory Visit: Payer: Medicare HMO | Admitting: Infectious Diseases

## 2023-10-07 ENCOUNTER — Encounter: Payer: Self-pay | Admitting: Infectious Diseases

## 2023-10-07 ENCOUNTER — Other Ambulatory Visit: Payer: Self-pay

## 2023-10-07 VITALS — BP 154/84 | HR 54 | Resp 16 | Ht 70.0 in | Wt 190.0 lb

## 2023-10-07 DIAGNOSIS — B9561 Methicillin susceptible Staphylococcus aureus infection as the cause of diseases classified elsewhere: Secondary | ICD-10-CM | POA: Diagnosis not present

## 2023-10-07 DIAGNOSIS — T887XXA Unspecified adverse effect of drug or medicament, initial encounter: Secondary | ICD-10-CM

## 2023-10-07 DIAGNOSIS — Z5181 Encounter for therapeutic drug level monitoring: Secondary | ICD-10-CM

## 2023-10-07 DIAGNOSIS — M4624 Osteomyelitis of vertebra, thoracic region: Secondary | ICD-10-CM

## 2023-10-07 DIAGNOSIS — M4644 Discitis, unspecified, thoracic region: Secondary | ICD-10-CM | POA: Diagnosis not present

## 2023-10-07 MED ORDER — CIPROFLOXACIN HCL 750 MG PO TABS: 750.0000 mg | ORAL_TABLET | Freq: Two times a day (BID) | ORAL | 1 refills | Status: DC

## 2023-10-07 NOTE — Progress Notes (Unsigned)
Patient Active Problem List   Diagnosis Date Noted   Blister 08/05/2023   PICC (peripherally inserted central catheter) in place 04/12/2023   Physical deconditioning 04/07/2023   Hypokalemia 02/22/2023   Chronic back pain 01/30/2023   Intractable back pain 12/04/2022   Medication management 12/04/2022   Vertebral osteomyelitis (HCC) 12/04/2022   Pseudomonas aeruginosa infection 07/07/2022   Osteomyelitis of thoracic region [facility]) 07/03/2022   Osteomyelitis of lumbar spine (HCC) 05/08/2022   Midline low back pain    Numbness    Osteomyelitis (HCC)    Chronic diastolic CHF (congestive heart failure) (HCC) 04/23/2022   Chronic pain 04/23/2022   Osteomyelitis of vertebra of thoracolumbar region (HCC) 04/22/2022   Presence of retained hardware 10/17/2021   Infective myositis 08/29/2021   Discitis of thoracolumbar region 07/31/2021   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   MRSA infection    Diabetic peripheral neuropathy (HCC)    Septic discitis of lumbar region 06/11/2021   Infection of spine (HCC) 05/28/2021   Medication monitoring encounter 05/09/2021   History of COVID-19    Psoas abscess (HCC)    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   Right shoulder pain 02/14/2021   Normocytic anemia 02/14/2021   Thrombocytosis 02/14/2021   Endocarditis 02/05/2021   Vision changes 02/04/2021   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   Pressure injury of skin 02/01/2021   HTN (hypertension) 02/01/2021   Anxiety 02/01/2021   Left shoulder pain 02/01/2021   Hyponatremia 01/31/2021   Positive colorectal cancer screening using Cologuard test 12/28/2020   DOE (dyspnea on exertion) 10/20/2020   Diabetic polyneuropathy associated with type 2 diabetes mellitus (HCC) 10/09/2020   DM2 (diabetes mellitus, type 2) (HCC) 10/30/2018   Lumbar stenosis with neurogenic claudication 10/14/2017   DDD (degenerative disc disease), lumbar 02/17/2017   Transient weakness of right lower extremity 02/05/2017    Hyperlipidemia associated with type 2 diabetes mellitus (HCC) 10/24/2016   Depression, recurrent (HCC) 10/24/2016   Right knee pain 05/21/2016   DDD (degenerative disc disease), lumbosacral 05/21/2016   Herniation of nucleus pulposus of cervical intervertebral disc without myelopathy 02/29/2016   Carpal tunnel syndrome 02/08/2016   Cervical spondylosis with myelopathy 01/17/2016   Lumbar stenosis 12/13/2014   Current Outpatient Medications on File Prior to Visit  Medication Sig Dispense Refill   acetaminophen (TYLENOL) 500 MG tablet Take 1 tablet (500 mg total) by mouth every 6 (six) hours as needed. 30 tablet 0   atorvastatin (LIPITOR) 20 MG tablet Take 1 tablet (20 mg total) by mouth daily. 90 tablet 3   Baclofen 5 MG TABS Take 2 tablets (10 mg total) by mouth 3 (three) times daily. 90 tablet 2   buprenorphine (BUTRANS) 7.5 MCG/HR apply 1 patch to skin once a week 4 patch 0   cefadroxil (DURICEF) 500 MG capsule Take 1 capsule (500 mg total) by mouth 2 (two) times daily. 60 capsule 5   ciprofloxacin (CIPRO) 750 MG tablet Take 1 tablet (750 mg total) by mouth 2 (two) times daily. 60 tablet 1   Continuous Glucose Sensor (FREESTYLE LIBRE 3 SENSOR) MISC 1 Device by Does not apply route every 14 (fourteen) days. Place 1 sensor on the skin every 14 days. Use to check glucose continuously 2 each 6   gabapentin (NEURONTIN) 300 MG capsule Take 1 capsule (300 mg total) by mouth 2 (two) times daily. 60 capsule 2   gabapentin (NEURONTIN) 300 MG capsule Take 1 capsule (300 mg total) by mouth 2 (two) times daily. 60 capsule 2  hydrOXYzine (ATARAX) 10 MG tablet Take 1 tablet (10 mg total) by mouth 3 (three) times daily. 90 tablet 2   morphine (MS CONTIN) 30 MG 12 hr tablet Take 1 tablet (30 mg total) by mouth every 12 (twelve) hours. 60 tablet 0   naloxone (NARCAN) 0.4 MG/ML injection Inject 1 mL (0.4 mg total) into the vein as needed. 1 mL 0   naloxone (NARCAN) nasal spray 4 mg/0.1 mL Use 1 spray in one  nostril for single dose as needed. May repeat every 2-3 minutes until patient responsive or EMS arrives. 1 each 1   oxyCODONE-acetaminophen (PERCOCET) 10-325 MG tablet Take 1 tablet by mouth 5 times daily as needed. 95 tablet 0   oxyCODONE-acetaminophen (PERCOCET) 10-325 MG tablet Take 1 tablet by mouth five times a day 150 tablet 0   oxyCODONE-acetaminophen (PERCOCET) 10-325 MG tablet Take 1 tablet by mouth 5 (five) times daily. 15 tablet 0   oxyCODONE-acetaminophen (PERCOCET) 10-325 MG tablet Take 1 tablet by mouth 5 (five) times daily. 150 tablet 0   oxyCODONE-acetaminophen (PERCOCET) 10-325 MG tablet Take 1 tablet by mouth 5 (five) times daily. 150 tablet 0   oxyCODONE-acetaminophen (PERCOCET) 10-325 MG tablet Take 1 tablet by mouth 5 (five) times daily. 150 tablet 0   oxyCODONE-acetaminophen (PERCOCET) 10-325 MG tablet Take 1 tablet by mouth 5 (five) times daily. 150 tablet 0   sildenafil (VIAGRA) 100 MG tablet Take 0.5-1 tablets (50-100 mg total) by mouth daily as needed for erectile dysfunction. 30 tablet 6   No current facility-administered medications on file prior to visit.   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   Admitted on 04/22/2022 with inability to walk, BLE numbness, intermittent fevers and urinary incontinence x3 weeks.  He was found to have  acute discitis T9/10-T11/T12 with epidural phlegmon/abscess, septic arthritis T11-T12, extensive enhancement paraspinal soft tissue, as well as soft tissue abscess. 8/30 S/p fluoroscopy guided T1-11 disc aspiration ( Cx PsA), 8/31 aspiration of left back superficial abscess ( Cx PsA). Blood cx 8/28 1/2 sets staph pseudintermedius/epidermidis. This was followed by I&D of subcut abscess with decompression and debridement of osteomyelitis via T10-T11 laminectomy on 09/07 by [name] ( OR cx NG). Seen by ID and antibiotics switched to cefepime and planned for 6 weeks course EOT 10/19 with plan to continue Ciprofloxacin thereafter. Patient was also continued on doxycycline for hardware associated lumbar osteomyelitis. Patient was discharged to CIR due to functional decline on 9/12 and then discharged home from rehab on 9/22.   Res-started on ciprofloxacin 750mg  po bid on 11/8 due to concerns of persistent infection in the setting of elevated inflammatory markers and continued to take PO doxycycline as is without any concerns.    Admitted 4/9-4/15/24 with intractable back pain. 4/9 MRI L spine with discitis, osteomyelitis in lower T and Lumbar spine, epidural phlegmon. 5.9 x 2.0 x 2.0 cm fluid collection spanning the L4-L5 interspinous space, at the site of prior laminectomy, which could reflect a  postoperative seroma, although abscess is not excluded.This is unchanged compared to 04/22/22. S/p IR aspiration 4/11,  cx grew MSSE. Discharged on cefazolin for 6 weeks. EOT 6/6 and transitioned to cefadroxil.   Admitted 6/29-7/11/24 for intractable back pain to get MRI.  MRI had to be performed with anesthesia on 7/2.  MRI findings with discitis/osteomyelitis of the lower T-spine progressed, phlegmon surrounding the thecal sac resulting in moderate narrowing of T10-T11 and severe at T11-T12 resulting in mass effect of the cord without cord signal abnormality, interval resolution of the ventral epidural fluid collection  at the L1 level. Per neurosurgery surgical intervention would be high risk and recommended conservative management.  S/p CT-guided aspiration on 7/10 under anesthesia, cultures with no growth.  Seen by ID and was discharged on IV cefepime for a total of 8 weeks, EOT 04/23/2023. Cefepime switched to PO ciprofloxacin and cefadroxil after completion of IV course.  Taking ciprofloxacin 750 mg po bid and cefadroxil 500mg  po bid  without missing doses or concerns. He is still unable to walk and stand independently.  08/05/23 Seen by Dr Danielle Dess 11/13 and MRI T spine has been ordered, reports he wants to do open MRI as he is claustrophobic. Reports compliance with cefadroxil and ciprofloxacin except missed 1-2 doses of abtx. Back pain has resolved, Stopped PT due to blister in the rt 4th toe. He is still not ambulatory and feels imbalanced when tries to stand without support. No  new concerns   2/11 ER twice a day, percocet 5 times as needed. Cut in left fifth toe that has healing up, no PT, Getting MRI in May, has not seen [facility]. Open MRI in [facility]. Discussed s/es with ciprofloxacin. Decrease dose in 2 months    Review of Systems: all systems reviewed with pertinent positives and negatives as listed above   Past Medical History:  Diagnosis Date   Depression    Diabetes mellitus without complication (HCC)    Hypertension    Spinal stenosis    With Neurogenic Claudication   Past Surgical History:  Procedure Laterality Date   CARPAL TUNNEL RELEASE Bilateral    INCISION AND DRAINAGE ABSCESS Left 03/12/2021   Procedure: INCISION AND DRAINAGE ABSCESS;  Surgeon: [name], MD;  Location: [facility];  Service: Orthopedics;  Laterality: Left;   IR FLUORO GUIDED NEEDLE PLC ASPIRATION/INJECTION LOC  04/24/2022   IR FLUORO GUIDED NEEDLE PLC ASPIRATION/INJECTION LOC  12/05/2022   IR FLUORO GUIDED NEEDLE PLC ASPIRATION/INJECTION LOC  03/05/2023   IR FLUORO GUIDED NEEDLE PLC ASPIRATION/INJECTION LOC   03/05/2023   IR US GUIDE BX ASP/DRAIN  02/02/2021   IRRIGATION AND DEBRIDEMENT ABSCESS Left 02/08/2021   Procedure: MINOR INCISION AND DRAINAGE OF ABSCESS;  Surgeon: [name], MD;  Location: [facility];  Service: Orthopedics;  Laterality: Left;   IRRIGATION AND DEBRIDEMENT ABSCESS Left 02/10/2021   Procedure: IRRIGATION AND DEBRIDEMENT ABSCESS LEFT LATERAL CHEST WALL;  Surgeon: [name], MD;  Location: [facility];  Service: General;  Laterality: Left;   IRRIGATION AND DEBRIDEMENT ABSCESS Bilateral 02/20/2021   Procedure: MINOR INCISION AND DRAINAGE OF ABSCESS/WITH BILATERAL WOUND VAC PLACEMENT;  Surgeon: [name], MD;  Location: [facility];  Service: Orthopedics;  Laterality: Bilateral;   IRRIGATION AND DEBRIDEMENT SHOULDER Bilateral 02/28/2021   Procedure: IRRIGATION AND DEBRIDEMENT SHOULDER;  Surgeon: [name], MD;  Location: [facility];  Service: Orthopedics;  Laterality: Bilateral;   IRRIGATION AND DEBRIDEMENT SHOULDER Left 03/06/2021   Procedure: IRRIGATION AND DEBRIDEMENT SHOULDER;  Surgeon: Margarita Rana  D, MD;  Location: [facility];  Service: Orthopedics;  Laterality: Left;   IRRIGATION AND DEBRIDEMENT SHOULDER Left 03/08/2021   Procedure: IRRIGATION AND DEBRIDEMENT SHOULDER;  Surgeon: [name], MD;  Location: [facility];  Service: Orthopedics;  Laterality: Left;   LUMBAR LAMINECTOMY FOR EPIDURAL ABSCESS N/A 05/02/2022   Procedure: THORACIC  LAMINECTOMY AND DEBRIDEMENT  FOR EPIDURAL ABSCESS;  Surgeon: [name], MD;  Location: [facility];  Service: Neurosurgery;  Laterality: N/A;   LUMBAR LAMINECTOMY/DECOMPRESSION MICRODISCECTOMY N/A 12/13/2014   Procedure: LUMBAR THREE-FOUR, LUMBAR FOUR-FIVE, LUMBAR FIVE-SACRAL ONE LAMINECTOMY;  Surgeon: [name], MD;  Location: [facility];  Service: Neurosurgery;  Laterality: N/A;  L3-4 L4-5 L5-S1 Laminectomy   LUMBAR LAMINECTOMY/DECOMPRESSION MICRODISCECTOMY Left 06/05/2021   Procedure: Left lumbar one-two, Lumbar two-three Laminectomy;  Surgeon: [name], MD;  Location: [facility] OR;  Service: Neurosurgery;  Laterality: Left;   MENISCUS REPAIR     Right knee   MINOR APPLICATION OF WOUND VAC Left 02/20/2021   Procedure: MINOR APPLICATION OF KERLIX PACKING/REMOVAL OF WOUND VAC;  Surgeon: [name], MD;  Location: [facility];  Service: General;  Laterality: Left;   RADIOLOGY WITH ANESTHESIA N/A 02/25/2023   Procedure: MRI WITH ANESTHESIA OF THORACIC AND LUMBAR WITH AND WITHOUT CONTRAST;  Surgeon: Radiologist, Medication, MD;  Location: [facility];  Service: Radiology;  Laterality: N/A;   RADIOLOGY WITH ANESTHESIA N/A 03/05/2023   Procedure: disc aspiration;  Surgeon: Baldemar Lenis, MD;  Location: [facility] OR;  Service: Radiology;  Laterality: N/A;   TEE WITHOUT CARDIOVERSION N/A 02/05/2021   Procedure: TRANSESOPHAGEAL ECHOCARDIOGRAM (TEE);  Surgeon: Quintella Reichert, MD;  Location: [facility] ENDOSCOPY;  Service: Cardiovascular;  Laterality: N/A;   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   TOTAL SHOULDER ARTHROPLASTY Right 02/10/2021   Procedure: RIGHT OPEN SHOULDER I & D, OPEN DISTAL CLAVICLE RESECTION;  Surgeon: [name], MD;  Location: [facility];  Service: Orthopedics;  Laterality: Right;   VASECTOMY      Social History   Tobacco Use   Smoking status: Former    Current packs/day: 0.00    Average packs/day: 2.0 packs/day for 15.0 years (30.0 ttl pk-yrs)    Types: Cigarettes    Start date: 44    Quit date: 2012    Years since quitting: 13.1    Passive exposure: Never   Smokeless tobacco: Never  Vaping Use   Vaping status: Never Used  Substance Use Topics   Alcohol use: Yes    Comment: occasional   Drug use: No    Family History  Problem Relation Age of Onset   Cancer Mother    Colon cancer Neg Hx     Allergies  Allergen Reactions   Tizanidine Other (See Comments)    Leg cramps    Health Maintenance  Topic Date Due   Medicare Annual Wellness (AWV)  Never done   Zoster Vaccines- Shingrix (1 of 2) Never done    COVID-19 Vaccine (3 - Moderna risk series) 02/14/2020   Pneumococcal Vaccine 85-39 Years old (2 of 2 - PCV) 06/23/2021   OPHTHALMOLOGY EXAM  11/17/2022   INFLUENZA VACCINE  11/24/2023 (Originally 03/27/2023)   Fecal DNA (Cologuard)  11/03/2023   Lung Cancer Screening  12/03/2023   HEMOGLOBIN A1C  03/10/2024   Diabetic kidney evaluation - eGFR measurement  08/04/2024   DTaP/Tdap/Td (2 - Td or Tdap) 08/14/2024   Diabetic kidney evaluation - Urine ACR  09/10/2024   FOOT EXAM  09/10/2024  Hepatitis C Screening  Completed   HIV Screening  Completed   HPV VACCINES  Aged Out    Objective: There were no vitals taken for this visit.  Physical Exam Constitutional:  older than stated age, malnourished   HENT:     Head: Normocephalic and atraumatic.      Mouth: Mucous membranes are moist.  Eyes:    Conjunctiva/sclera: Conjunctivae normal.     Pupils:   Cardiovascular:     Rate and Rhythm: Normal rate and regular rhythm.     Heart sounds:  Pulmonary:     Effort: Pulmonary effort is normal.     Breath sounds:   Abdominal:     General: Non distended     Palpations:   Musculoskeletal:        General: sitting in a wheel chair and needs support to stand.  No changes in power of lower extremities   Skin:    General: Skin is warm and dry.     Comments: Superficial ruptured blister in the rt 4th toes, does not appear to be infected      Neurological:     General:     Mental Status: awake, alert and oriented to person, place, and time.   Psychiatric:        Mood and Affect: Mood normal.   Lab Results Lab Results  Component Value Date   WBC 8.4 08/05/2023   HGB 14.1 08/05/2023   HCT 42.3 08/05/2023   MCV 85.3 08/05/2023   PLT 338 08/05/2023    Lab Results  Component Value Date   CREATININE 0.85 08/05/2023   BUN 10 08/05/2023   NA 143 08/05/2023   K 4.4 08/05/2023   CL 105 08/05/2023   CO2 25 08/05/2023    Lab Results  Component Value Date   ALT 15 02/23/2023    AST 17 02/23/2023   ALKPHOS 85 02/23/2023   BILITOT 0.9 02/23/2023    Lab Results  Component Value Date   CHOL 145 09/11/2023   HDL 59 09/11/2023   LDLCALC 68 09/11/2023   TRIG 100 09/11/2023   CHOLHDL 2.5 09/11/2023   No results found for: "LABRPR", "RPRTITER" No results found for: "HIV1RNAQUANT", "HIV1RNAVL", "CD4TABS"   Microbiology  Results for orders placed or performed during the hospital encounter of 02/22/23  Culture, blood (Routine X 2) w Reflex to ID Panel     Status: None   Collection Time: 02/23/23  7:38 AM   Specimen: BLOOD  Result Value Ref Range Status   Specimen Description BLOOD SITE NOT SPECIFIED  Final   Special Requests   Final    BOTTLES DRAWN AEROBIC AND ANAEROBIC Blood Culture adequate volume   Culture   Final    NO GROWTH 5 DAYS Performed at [facility], [address] [place], [place] [address]    Report Status 02/28/2023 FINAL  Final  Culture, blood (Routine X 2) w Reflex to ID Panel     Status: None   Collection Time: 02/23/23  7:40 AM   Specimen: BLOOD  Result Value Ref Range Status   Specimen Description BLOOD SITE NOT SPECIFIED  Final   Special Requests   Final    BOTTLES DRAWN AEROBIC AND ANAEROBIC Blood Culture adequate volume   Culture   Final    NO GROWTH 5 DAYS Performed at [facility], [address], [place], [place] [address]    Report Status 02/28/2023 FINAL  Final  Aerobic/Anaerobic Culture w Gram  Stain (surgical/deep wound)     Status: None   Collection Time: 03/05/23 10:04 AM   Specimen: Fine Needle Aspirate  Result Value Ref Range Status   Specimen Description NEEDLE ASPIRATE  Final   Special Requests NONE  Final   Gram Stain   Final    FEW WBC PRESENT, PREDOMINANTLY PMN NO ORGANISMS SEEN    Culture   Final    No growth aerobically or anaerobically. Performed at [facility], [address], [place], [place] [address]    Report Status 03/10/2023 FINAL  Final  Aerobic/Anaerobic Culture w Gram Stain  (surgical/deep wound)     Status: None   Collection Time: 03/05/23 10:07 AM   Specimen: Fine Needle Aspirate  Result Value Ref Range Status   Specimen Description NEEDLE ASPIRATE  Final   Special Requests  IDISC  Final   Gram Stain   Final    RARE WBC PRESENT, PREDOMINANTLY PMN NO ORGANISMS SEEN    Culture   Final    No growth aerobically or anaerobically. Performed at [facility], [address], [place], [place] [address]    Report Status 03/10/2023 FINAL  Final  Acid Fast Smear (AFB)     Status: None   Collection Time: 03/05/23 10:09 AM   Specimen: Intervertebral Disc; Fine Needle Aspirate  Result Value Ref Range Status   AFB Specimen Processing Concentration  Final   Acid Fast Smear Negative  Final    Comment: (NOTE) Performed At: [facility] [address] [place], [place] [id number] Jolene Schimke MD YQ:[id number]    Source (AFB) NEEDLE ASPIRATE  Final    Comment: Performed at [facility], [address], [place], [place] [address]  Acid Fast Culture with reflexed sensitivities     Status: None   Collection Time: 03/05/23 10:09 AM   Specimen: Intervertebral Disc; Fine Needle Aspirate  Result Value Ref Range Status   Acid Fast Culture Negative  Final    Comment: (NOTE) No acid fast bacilli isolated after 6 weeks. Performed At: [place] [facility] [address] [place], [place] [id number] Jolene Schimke MD OZ:[id number]    Source of Sample NEEDLE ASPIRATE  Final    Comment: Performed at [facility], [address] [place], [place] [address]  Fungus Culture With Stain     Status: None   Collection Time: 03/05/23 10:09 AM   Specimen: Intervertebral Disc  Result Value Ref Range Status   Fungus Stain Final report  Final   Fungus (Mycology) Culture Final report  Final    Comment: (NOTE) Performed At: [facility] [address] [place], [place] [id number] Jolene Schimke MD RJ:[id number]    Fungal Source NEEDLE ASPIRATE  Final     Comment: Performed at [facility], [address] [place], [place] [address]  Fungus Culture Result     Status: None   Collection Time: 03/05/23 10:09 AM  Result Value Ref Range Status   Result 1 Comment  Final    Comment: (NOTE) KOH/Calcofluor preparation:  no fungus observed. Performed At: [facility] [address] [place], [place] [id number] Jolene Schimke MD UK:[id number]   Fungal organism reflex     Status: None   Collection Time: 03/05/23 10:09 AM  Result Value Ref Range Status   Fungal result 1 Comment  Final    Comment: (NOTE) No yeast or mold isolated after 4 weeks. Performed At: [facility] [address] [place], [place] [id number] Clovis Riley  Claudie Fisherman MD MV:[id number]   Fungus Culture With Stain     Status: None   Collection Time: 03/05/23 12:25 PM  Result Value Ref Range Status   Fungus Stain Final report  Final   Fungus (Mycology) Culture Final report  Final    Comment: (NOTE) Performed At: [facility] [address] [place], [place] [id number] Jolene Schimke MD UU:[id number]    Fungal Source NEEDLE ASPIRATE  Final    Comment: Performed at [facility], [address], [place], [place] [address]  Acid Fast Smear (AFB)     Status: None   Collection Time: 03/05/23 12:25 PM   Specimen: Intervertebral Disc; Fine Needle Aspirate  Result Value Ref Range Status   AFB Specimen Processing Concentration  Final   Acid Fast Smear Negative  Final    Comment: (NOTE) Performed At: [facility] [address] [place], [place] [id number] Jolene Schimke MD PP:[id number]    Source (AFB) NEEDLE ASPIRATE  Final    Comment: Performed at [facility], [address], [place], [place] [address]  Acid Fast Culture with reflexed sensitivities     Status: None   Collection Time: 03/05/23 12:25 PM   Specimen: Intervertebral Disc; Fine Needle Aspirate  Result Value Ref Range Status   Acid Fast Culture Negative  Final     Comment: (NOTE) No acid fast bacilli isolated after 6 weeks. Performed At: [facility] [address] [place], [place] [id number] Jolene Schimke MD TD:[id number]    Source of Sample NEEDLE ASPIRATE  Final    Comment: Performed at [facility] Lab, [address], [place], [place] [address]  Fungus Culture Result     Status: None   Collection Time: 03/05/23 12:25 PM  Result Value Ref Range Status   Result 1 Comment  Final    Comment: (NOTE) KOH/Calcofluor preparation:  no fungus observed. Performed At: [facility] [address] [place], [place] [id number] Jolene Schimke MD YW:[id number]   Fungal organism reflex     Status: None   Collection Time: 03/05/23 12:25 PM  Result Value Ref Range Status   Fungal result 1 Comment  Final    Comment: (NOTE) No yeast or mold isolated after 4 weeks. Performed At: [facility] [address] [place], [place] [id number] Jolene Schimke MD KK:[id number]    Imaging MRI L spine 02/25/23 IMPRESSION: 1. Findings consistent with discitis/osteomyelitis of the lower thoracic spine, progressed since prior thoracic MRI (September 2023). The T11-12 level, which was included on prior MRI of the lumbar spine (May 2024) appear stable. 2. Phlegmon surrounding the thecal sac results in moderate narrowing at T10-11 and severe at T11-12, resulting in mass effect on the cord without cord signal abnormality. 3. Interval resolution of ventral epidural fluid collection at the L1 level. 4. Degenerative changes of the thoracic spine with moderate spinal canal stenosis at T5-6. 5. Degenerative changes of the lumbar spine with moderate bilateral neural foraminal narrowing at L1-2 and L2-3 and moderate left neural foraminal narrowing at L5-S1.   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 Plan to continue ciprofloxacin for at least 6-12  months, cefadroxil indefinitely  -Labs today  - Fu MRI T spine as well as neurosurgery as instructed -Fu in 2 month   # Blister in rt 4th toe - ruptured, superficial, no signs of infection  - monitor for any signs of infection, h/o DM  #  Medication Monitoring  -labs today -EKG today w qtc 421   I have personally spent 32 minutes involved in face-to-face and non-face-to-face activities for this patient on the day of the visit. Professional time spent includes the following activities: Preparing to see the patient (review of tests), Obtaining and/or reviewing separately obtained history (admission/discharge record), Performing a medically appropriate examination and/or evaluation , Ordering medications/tests/procedures, referring and communicating with other health care professionals, Documenting clinical information in the EMR, Independently interpreting results (not separately reported), Communicating results to the patient/family/caregiver, Counseling and educating the patient/family/caregiver and Care coordination (not separately reported).   Victoriano Lain, MD [facility] 10/07/2023, 1:34 PM

## 2023-10-08 LAB — CBC
HCT: 38.2 % — ABNORMAL LOW (ref 38.5–50.0)
Hemoglobin: 13 g/dL — ABNORMAL LOW (ref 13.2–17.1)
MCH: 28.7 pg (ref 27.0–33.0)
MCHC: 34 g/dL (ref 32.0–36.0)
MCV: 84.3 fL (ref 80.0–100.0)
MPV: 10.1 fL (ref 7.5–12.5)
Platelets: 327 10*3/uL (ref 140–400)
RBC: 4.53 10*6/uL (ref 4.20–5.80)
RDW: 12.6 % (ref 11.0–15.0)
WBC: 5.4 10*3/uL (ref 3.8–10.8)

## 2023-10-08 LAB — BASIC METABOLIC PANEL
BUN: 10 mg/dL (ref 7–25)
CO2: 30 mmol/L (ref 20–32)
Calcium: 9.7 mg/dL (ref 8.6–10.3)
Chloride: 100 mmol/L (ref 98–110)
Creat: 0.73 mg/dL (ref 0.70–1.30)
Glucose, Bld: 126 mg/dL — ABNORMAL HIGH (ref 65–99)
Potassium: 4.1 mmol/L (ref 3.5–5.3)
Sodium: 138 mmol/L (ref 135–146)

## 2023-10-09 DIAGNOSIS — T887XXA Unspecified adverse effect of drug or medicament, initial encounter: Secondary | ICD-10-CM | POA: Insufficient documentation

## 2023-10-16 ENCOUNTER — Other Ambulatory Visit: Payer: Self-pay

## 2023-10-16 ENCOUNTER — Other Ambulatory Visit (HOSPITAL_COMMUNITY): Payer: Self-pay

## 2023-10-16 MED ORDER — HYDROXYZINE HCL 10 MG PO TABS
10.0000 mg | ORAL_TABLET | Freq: Three times a day (TID) | ORAL | 2 refills | Status: DC
Start: 2023-10-16 — End: 2024-01-07
  Filled 2023-10-16 (×2): qty 90, 30d supply, fill #0
  Filled 2023-11-13: qty 90, 30d supply, fill #1
  Filled 2023-12-10: qty 90, 30d supply, fill #2

## 2023-10-16 MED ORDER — BACLOFEN 5 MG PO TABS
5.0000 mg | ORAL_TABLET | Freq: Three times a day (TID) | ORAL | 2 refills | Status: DC
  Filled 2023-10-16 (×2): qty 90, 30d supply, fill #0
  Filled 2023-11-13: qty 90, 30d supply, fill #1
  Filled 2023-12-10: qty 90, 30d supply, fill #2

## 2023-10-16 MED ORDER — MORPHINE SULFATE ER 30 MG PO TBCR
30.0000 mg | EXTENDED_RELEASE_TABLET | Freq: Two times a day (BID) | ORAL | 0 refills | Status: DC
  Filled 2023-10-16 – 2023-10-26 (×2): qty 60, 30d supply, fill #0

## 2023-10-16 MED ORDER — OXYCODONE-ACETAMINOPHEN 10-325 MG PO TABS
1.0000 | ORAL_TABLET | Freq: Every day | ORAL | 0 refills | Status: DC
  Filled 2023-10-16: qty 150, 30d supply, fill #0

## 2023-10-17 ENCOUNTER — Other Ambulatory Visit: Payer: Self-pay

## 2023-10-17 ENCOUNTER — Other Ambulatory Visit (HOSPITAL_COMMUNITY): Payer: Self-pay

## 2023-10-17 ENCOUNTER — Other Ambulatory Visit: Payer: Self-pay | Admitting: Infectious Diseases

## 2023-10-17 MED ORDER — CIPROFLOXACIN HCL 750 MG PO TABS
750.0000 mg | ORAL_TABLET | Freq: Two times a day (BID) | ORAL | 1 refills | Status: DC
  Filled 2023-10-17: qty 8, 4d supply, fill #0
  Filled 2023-10-17: qty 52, 26d supply, fill #0
  Filled 2023-11-14: qty 60, 30d supply, fill #1

## 2023-10-20 ENCOUNTER — Other Ambulatory Visit (HOSPITAL_COMMUNITY): Payer: Self-pay

## 2023-10-27 ENCOUNTER — Other Ambulatory Visit (HOSPITAL_COMMUNITY): Payer: Self-pay

## 2023-11-05 ENCOUNTER — Other Ambulatory Visit (HOSPITAL_COMMUNITY): Payer: Self-pay

## 2023-11-13 ENCOUNTER — Other Ambulatory Visit (HOSPITAL_COMMUNITY): Payer: Self-pay

## 2023-11-13 MED ORDER — OXYCODONE-ACETAMINOPHEN 10-325 MG PO TABS
1.0000 | ORAL_TABLET | Freq: Every day | ORAL | 0 refills | Status: DC
  Filled 2023-11-13: qty 150, 30d supply, fill #0

## 2023-11-13 MED ORDER — GABAPENTIN 300 MG PO CAPS
300.0000 mg | ORAL_CAPSULE | Freq: Two times a day (BID) | ORAL | 2 refills | Status: DC
  Filled 2023-11-13 – 2023-12-10 (×2): qty 60, 30d supply, fill #0
  Filled 2024-01-07: qty 60, 30d supply, fill #1
  Filled 2024-02-05: qty 60, 30d supply, fill #2

## 2023-11-14 ENCOUNTER — Other Ambulatory Visit: Payer: Self-pay

## 2023-11-15 ENCOUNTER — Other Ambulatory Visit (HOSPITAL_COMMUNITY): Payer: Self-pay

## 2023-12-03 ENCOUNTER — Telehealth: Payer: Self-pay | Admitting: Family

## 2023-12-10 ENCOUNTER — Other Ambulatory Visit: Payer: Self-pay | Admitting: Infectious Diseases

## 2023-12-10 ENCOUNTER — Other Ambulatory Visit (HOSPITAL_COMMUNITY): Payer: Self-pay

## 2023-12-10 ENCOUNTER — Other Ambulatory Visit: Payer: Self-pay

## 2023-12-11 ENCOUNTER — Other Ambulatory Visit (HOSPITAL_COMMUNITY): Payer: Self-pay

## 2023-12-11 MED ORDER — CIPROFLOXACIN HCL 750 MG PO TABS
750.0000 mg | ORAL_TABLET | Freq: Two times a day (BID) | ORAL | 1 refills | Status: DC
  Filled 2023-12-11: qty 60, 30d supply, fill #0

## 2023-12-11 MED ORDER — OXYCODONE-ACETAMINOPHEN 10-325 MG PO TABS
1.0000 | ORAL_TABLET | Freq: Every day | ORAL | 0 refills | Status: DC
  Filled 2023-12-11: qty 150, 30d supply, fill #0

## 2023-12-11 MED ORDER — MORPHINE SULFATE ER 15 MG PO TBCR
15.0000 mg | EXTENDED_RELEASE_TABLET | Freq: Three times a day (TID) | ORAL | 0 refills | Status: DC
  Filled 2023-12-11: qty 90, 30d supply, fill #0

## 2023-12-16 ENCOUNTER — Ambulatory Visit: Payer: Medicare HMO | Admitting: Infectious Diseases

## 2023-12-19 ENCOUNTER — Other Ambulatory Visit: Payer: Self-pay

## 2023-12-19 ENCOUNTER — Other Ambulatory Visit (HOSPITAL_COMMUNITY): Payer: Self-pay

## 2023-12-19 ENCOUNTER — Encounter: Payer: Self-pay | Admitting: Infectious Diseases

## 2023-12-19 ENCOUNTER — Ambulatory Visit: Admitting: Infectious Diseases

## 2023-12-19 VITALS — BP 153/93 | HR 73 | Temp 97.9°F | Ht 70.0 in

## 2023-12-19 DIAGNOSIS — Z5181 Encounter for therapeutic drug level monitoring: Secondary | ICD-10-CM

## 2023-12-19 DIAGNOSIS — M4624 Osteomyelitis of vertebra, thoracic region: Secondary | ICD-10-CM

## 2023-12-19 MED ORDER — CEFADROXIL 500 MG PO CAPS
500.0000 mg | ORAL_CAPSULE | Freq: Two times a day (BID) | ORAL | 11 refills | Status: AC
  Filled 2023-12-19 – 2024-01-07 (×2): qty 60, 30d supply, fill #0
  Filled 2024-02-05: qty 60, 30d supply, fill #1
  Filled 2024-03-03: qty 60, 30d supply, fill #2
  Filled 2024-04-01: qty 60, 30d supply, fill #3
  Filled 2024-04-29: qty 60, 30d supply, fill #4
  Filled 2024-05-25: qty 60, 30d supply, fill #5
  Filled 2024-06-24: qty 60, 30d supply, fill #6
  Filled 2024-07-21: qty 60, 30d supply, fill #7
  Filled 2024-08-17: qty 60, 30d supply, fill #8
  Filled 2024-09-15: qty 60, 30d supply, fill #9

## 2023-12-19 MED ORDER — CIPROFLOXACIN HCL 500 MG PO TABS
500.0000 mg | ORAL_TABLET | Freq: Two times a day (BID) | ORAL | 1 refills | Status: DC
Start: 2023-12-19 — End: 2024-02-26
  Filled 2023-12-19 – 2024-01-07 (×2): qty 60, 30d supply, fill #0
  Filled 2024-02-05: qty 60, 30d supply, fill #1

## 2023-12-19 NOTE — Progress Notes (Signed)
 Patient Active Problem List   Diagnosis Date Noted   Drug side effects 10/09/2023   Blister 08/05/2023   Physical deconditioning 04/07/2023   Hypokalemia 02/22/2023   Chronic back pain 01/30/2023   Intractable back pain 12/04/2022   Medication management 12/04/2022   Vertebral osteomyelitis (HCC) 12/04/2022   Pseudomonas aeruginosa infection 07/07/2022   Osteomyelitis of thoracic region [facility]) 07/03/2022   Osteomyelitis of lumbar spine (HCC) 05/08/2022   Midline low back pain    Numbness    Osteomyelitis (HCC)    Chronic diastolic CHF (congestive heart failure) (HCC) 04/23/2022   Chronic pain 04/23/2022   Osteomyelitis of vertebra of thoracolumbar region (HCC) 04/22/2022   Presence of retained hardware 10/17/2021   Infective myositis 08/29/2021   Discitis of thoracolumbar region 07/31/2021   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   MRSA infection    Diabetic peripheral neuropathy (HCC)    Septic discitis of lumbar region 06/11/2021   Infection of spine (HCC) 05/28/2021   Medication monitoring encounter 05/09/2021   History of COVID-19    Psoas abscess (HCC)    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   Right shoulder pain 02/14/2021   Normocytic anemia 02/14/2021   Thrombocytosis 02/14/2021   Endocarditis 02/05/2021   Vision changes 02/04/2021   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   Pressure injury of skin 02/01/2021   HTN (hypertension) 02/01/2021   Anxiety 02/01/2021   Left shoulder pain 02/01/2021   Hyponatremia 01/31/2021   Positive colorectal cancer screening using Cologuard test 12/28/2020   DOE (dyspnea on exertion) 10/20/2020   Diabetic polyneuropathy associated with type 2 diabetes mellitus (HCC) 10/09/2020   DM2 (diabetes mellitus, type 2) (HCC) 10/30/2018   Lumbar stenosis with neurogenic claudication 10/14/2017   DDD (degenerative disc disease), lumbar 02/17/2017   Transient weakness of right lower extremity 02/05/2017   Hyperlipidemia associated with type 2  diabetes mellitus (HCC) 10/24/2016   Depression, recurrent (HCC) 10/24/2016   Right knee pain 05/21/2016   DDD (degenerative disc disease), lumbosacral 05/21/2016   Herniation of nucleus pulposus of cervical intervertebral disc without myelopathy 02/29/2016   Carpal tunnel syndrome 02/08/2016   Cervical spondylosis with myelopathy 01/17/2016   Lumbar stenosis 12/13/2014   Current Outpatient Medications on File Prior to Visit  Medication Sig Dispense Refill   acetaminophen  (TYLENOL ) 500 MG tablet Take 1 tablet (500 mg total) by mouth every 6 (six) hours as needed. 30 tablet 0   atorvastatin  (LIPITOR) 20 MG tablet Take 1 tablet (20 mg total) by mouth daily. 90 tablet 3   Baclofen  5 MG TABS Take 2 tablets (10 mg total) by mouth 3 (three) times daily. 90 tablet 2   Baclofen  5 MG TABS Take 1 tablet (5 mg total) by mouth 3 (three) times daily. 90 tablet 2   buprenorphine  (BUTRANS ) 7.5 MCG/HR apply 1 patch to skin once a week 4 patch 0   cefadroxil  (DURICEF) 500 MG capsule Take 1 capsule (500 mg total) by mouth 2 (two) times daily. 60 capsule 5   ciprofloxacin  (CIPRO ) 750 MG tablet Take 1 tablet (750 mg total) by mouth 2 (two) times daily. 60 tablet 1   Continuous Glucose Sensor (FREESTYLE LIBRE 3 SENSOR) MISC 1 Device by Does not apply route every 14 (fourteen) days. Place 1 sensor on the skin every 14 days. Use to check glucose continuously 2 each 6   gabapentin  (NEURONTIN ) 300 MG capsule Take 1 capsule (300 mg total) by mouth 2 (two) times daily. 60 capsule 2   gabapentin  (NEURONTIN ) 300  MG capsule Take 1 capsule (300 mg total) by mouth 2 (two) times daily. 60 capsule 2   gabapentin  (NEURONTIN ) 300 MG capsule Take 1 capsule (300 mg total) by mouth 2 (two) times daily. 60 capsule 2   hydrOXYzine  (ATARAX ) 10 MG tablet Take 1 tablet (10 mg total) by mouth 3 (three) times daily. 90 tablet 2   morphine  (MS CONTIN ) 15 MG 12 hr tablet Take 1 tablet (15 mg total) by mouth every 8 (eight) hours. 90 tablet  0   morphine  (MS CONTIN ) 30 MG 12 hr tablet Take 1 tablet by mouth every twelve hours. 60 tablet 0   naloxone  (NARCAN ) 0.4 MG/ML injection Inject 1 mL (0.4 mg total) into the vein as needed. 1 mL 0   naloxone  (NARCAN ) nasal spray 4 mg/0.1 mL Use 1 spray in one nostril for single dose as needed. May repeat every 2-3 minutes until patient responsive or EMS arrives. 1 each 1   oxyCODONE -acetaminophen  (PERCOCET) 10-325 MG tablet Take 1 tablet by mouth 5 times daily as needed. 95 tablet 0   oxyCODONE -acetaminophen  (PERCOCET) 10-325 MG tablet Take 1 tablet by mouth five times a day 150 tablet 0   oxyCODONE -acetaminophen  (PERCOCET) 10-325 MG tablet Take 1 tablet by mouth 5 (five) times daily. 15 tablet 0   oxyCODONE -acetaminophen  (PERCOCET) 10-325 MG tablet Take 1 tablet by mouth 5 (five) times daily. 150 tablet 0   oxyCODONE -acetaminophen  (PERCOCET) 10-325 MG tablet Take 1 tablet by mouth 5 (five) times daily. 150 tablet 0   oxyCODONE -acetaminophen  (PERCOCET) 10-325 MG tablet Take 1 tablet by mouth 5 (five) times daily. 150 tablet 0   oxyCODONE -acetaminophen  (PERCOCET) 10-325 MG tablet Take 1 tablet by mouth 5 (five) times daily for 150 doses. 150 tablet 0   sildenafil  (VIAGRA ) 100 MG tablet Take 0.5-1 tablets (50-100 mg total) by mouth daily as needed for erectile dysfunction. 30 tablet 6   No current facility-administered medications on file prior to visit.   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   Admitted on 04/22/2022 with inability to walk, BLE numbness, intermittent fevers and urinary incontinence x3 weeks.  He was found to have acute discitis T9/10-T11/T12 with epidural phlegmon/abscess, septic arthritis T11-T12, extensive enhancement paraspinal soft tissue, as well as soft tissue abscess. 8/30 S/p fluoroscopy guided T1-11 disc aspiration ( Cx PsA), 8/31 aspiration of left back superficial abscess ( Cx PsA). Blood cx 8/28 1/2 sets staph pseudintermedius/epidermidis. This was followed by I&D of subcut abscess with decompression and debridement of osteomyelitis via T10-T11 laminectomy on 09/07 by Dr. Ellery Guthrie ( OR cx NG). Seen by ID and antibiotics switched to cefepime  and planned for 6 weeks course EOT 10/19 with plan to continue Ciprofloxacin  thereafter. Patient was also continued on doxycycline  for hardware associated lumbar osteomyelitis. Patient was discharged to CIR due to functional decline on 9/12 and then discharged home from rehab on 9/22.   Res-started on ciprofloxacin  750mg  po bid on 11/8 due to concerns of persistent infection  in the setting of elevated inflammatory markers and continued to take PO doxycycline  as is without any concerns.    Admitted 4/9-4/15/24 with intractable back pain. 4/9 MRI L spine with discitis, osteomyelitis in lower T and Lumbar spine, epidural phlegmon. 5.9 x 2.0 x 2.0 cm fluid collection spanning the L4-L5 interspinous space, at the site of prior laminectomy, which could reflect a postoperative seroma, although abscess is not excluded.This is unchanged compared to 04/22/22. S/p IR aspiration 4/11,  cx grew MSSE. Discharged on cefazolin  for 6 weeks. EOT 6/6 and transitioned to cefadroxil .   Admitted 6/29-7/11/24 for intractable back pain to get MRI.  MRI had to be performed with anesthesia on 7/2.   MRI findings with discitis/osteomyelitis of the lower T-spine progressed, phlegmon surrounding the thecal sac resulting in moderate narrowing of T10-T11 and severe at T11-T12 resulting in mass effect of the cord without cord signal abnormality, interval resolution of the ventral epidural fluid collection at the L1 level. Per neurosurgery surgical intervention would be high risk and recommended conservative management.  S/p CT-guided aspiration on 7/10 under anesthesia, cultures with no growth.  Seen by ID and was discharged on IV cefepime  for a total of 8 weeks, EOT 04/23/2023. Cefepime  switched to PO ciprofloxacin  and cefadroxil  after completion of IV course.  Taking ciprofloxacin  750 mg po bid and cefadroxil  500mg  po bid  without missing doses or concerns. He is still unable to walk independently.  08/05/23 Seen by Dr Ellery Guthrie 11/13 and MRI T spine has been ordered  12/19/23 Accompanied by wife. Back pain is stable. He has not followed up with Nsx yet and has not been able to do MRI yet as unable to find open MRI. Told to discuss with Neurosurgery about this and they will call his office today. He has been compliant with both cefadroxil  and ciprofloxacin  except missed 2 doses during Easter. He is able to walk at home with walker and uses wheelchair outside home. He is able to stand briefly without support but difficult to walk. He says numbness in his left lower extremity has improved, only limited to left foot ( previously up to thigh). No wounds in bilateral feet. No new concerns.   Review of Systems: all systems reviewed with pertinent positives and negatives as listed above   Past Medical History:  Diagnosis Date   Depression    Diabetes mellitus without complication (HCC)    Hypertension    Spinal stenosis    With Neurogenic Claudication   Past Surgical History:  Procedure Laterality Date   CARPAL TUNNEL RELEASE Bilateral    INCISION AND DRAINAGE ABSCESS Left 03/12/2021   Procedure:  INCISION AND DRAINAGE ABSCESS;  Surgeon: Osa Blase, MD;  Location: [facility];  Service: Orthopedics;  Laterality: Left;   IR FLUORO GUIDED NEEDLE PLC ASPIRATION/INJECTION LOC  04/24/2022   IR FLUORO GUIDED NEEDLE PLC ASPIRATION/INJECTION LOC  12/05/2022   IR FLUORO GUIDED NEEDLE PLC ASPIRATION/INJECTION LOC  03/05/2023   IR FLUORO GUIDED NEEDLE PLC ASPIRATION/INJECTION LOC  03/05/2023   IR US  GUIDE BX ASP/DRAIN  02/02/2021   IRRIGATION AND DEBRIDEMENT ABSCESS Left 02/08/2021   Procedure: MINOR INCISION AND DRAINAGE OF ABSCESS;  Surgeon: Osa Blase, MD;  Location: [facility];  Service: Orthopedics;  Laterality: Left;   IRRIGATION AND DEBRIDEMENT ABSCESS Left 02/10/2021   Procedure: IRRIGATION AND DEBRIDEMENT ABSCESS LEFT LATERAL CHEST WALL;  Surgeon: Aldean Hummingbird, MD;  Location: [facility];  Service: General;  Laterality: Left;   IRRIGATION AND DEBRIDEMENT ABSCESS Bilateral 02/20/2021  Procedure: MINOR INCISION AND DRAINAGE OF ABSCESS/WITH BILATERAL WOUND VAC PLACEMENT;  Surgeon: Osa Blase, MD;  Location: [facility];  Service: Orthopedics;  Laterality: Bilateral;   IRRIGATION AND DEBRIDEMENT SHOULDER Bilateral 02/28/2021   Procedure: IRRIGATION AND DEBRIDEMENT SHOULDER;  Surgeon: Micheline Ahr, MD;  Location: [facility];  Service: Orthopedics;  Laterality: Bilateral;   IRRIGATION AND DEBRIDEMENT SHOULDER Left 03/06/2021   Procedure: IRRIGATION AND DEBRIDEMENT SHOULDER;  Surgeon: Saundra Curl, MD;  Location: [facility];  Service: Orthopedics;  Laterality: Left;   IRRIGATION AND DEBRIDEMENT SHOULDER Left 03/08/2021   Procedure: IRRIGATION AND DEBRIDEMENT SHOULDER;  Surgeon: Osa Blase, MD;  Location: [facility];  Service: Orthopedics;  Laterality: Left;   LUMBAR LAMINECTOMY FOR EPIDURAL ABSCESS N/A 05/02/2022   Procedure: THORACIC  LAMINECTOMY AND DEBRIDEMENT  FOR EPIDURAL ABSCESS;  Surgeon: Elna Haggis, MD;  Location: [facility];  Service: Neurosurgery;  Laterality: N/A;   LUMBAR LAMINECTOMY/DECOMPRESSION  MICRODISCECTOMY N/A 12/13/2014   Procedure: LUMBAR THREE-FOUR, LUMBAR FOUR-FIVE, LUMBAR FIVE-SACRAL ONE LAMINECTOMY;  Surgeon: Elna Haggis, MD;  Location: [facility];  Service: Neurosurgery;  Laterality: N/A;  L3-4 L4-5 L5-S1 Laminectomy   LUMBAR LAMINECTOMY/DECOMPRESSION MICRODISCECTOMY Left 06/05/2021   Procedure: Left lumbar one-two, Lumbar two-three Laminectomy;  Surgeon: Elna Haggis, MD;  Location[facility];  Service: Neurosurgery;  Laterality: Left;   MENISCUS REPAIR     Right knee   MINOR APPLICATION OF WOUND VAC Left 02/20/2021   Procedure: MINOR APPLICATION OF KERLIX PACKING/REMOVAL OF WOUND VAC;  Surgeon: Jacolyn Matar, MD;  Location: [facility];  Service: General;  Laterality: Left;   RADIOLOGY WITH ANESTHESIA N/A 02/25/2023   Procedure: MRI WITH ANESTHESIA OF THORACIC AND LUMBAR WITH AND WITHOUT CONTRAST;  Surgeon: Radiologist, Medication, MD;  Location: [facility];  Service: Radiology;  Laterality: N/A;   RADIOLOGY WITH ANESTHESIA N/A 03/05/2023   Procedure: disc aspiration;  Surgeon: de Macedo Rodrigues, Katyucia, MD;  Location[facility] OR;  Service: Radiology;  Laterality: N/A;   TEE WITHOUT CARDIOVERSION N/A 02/05/2021   Procedure: TRANSESOPHAGEAL ECHOCARDIOGRAM (TEE);  Surgeon: Jacqueline Matsu, MD;  Location: [facility] ENDOSCOPY;  Service: Cardiovascular;  Laterality: N/A;   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   TOTAL SHOULDER ARTHROPLASTY Right 02/10/2021   Procedure: RIGHT OPEN SHOULDER I & D, OPEN DISTAL CLAVICLE RESECTION;  Surgeon: Osa Blase, MD;  [facility]: [facility];  Service: Orthopedics;  Laterality: Right;   VASECTOMY      Social History   Tobacco Use   Smoking status: Former    Current packs/day: 0.00    Average packs/day: 2.0 packs/day for 15.0 years (30.0 ttl pk-yrs)    Types: Cigarettes    Start date: 1    Quit date: 2012    Years since quitting: 13.3    Passive exposure: Never   Smokeless tobacco: Never  Vaping Use   Vaping status: Never Used  Substance  Use Topics   Alcohol use: Yes    Comment: occasional   Drug use: No    Family History  Problem Relation Age of Onset   Cancer Mother    Colon cancer Neg Hx     Allergies  Allergen Reactions   Tizanidine  Other (See Comments)    Leg cramps    Health Maintenance  Topic Date Due   Medicare Annual Wellness (AWV)  Never done   Zoster Vaccines- Shingrix (1 of 2) Never done   COVID-19 Vaccine (3 - Moderna risk series) 02/14/2020   Pneumococcal Vaccine 52-29 Years old (  2 of 2 - PCV) 06/23/2021   OPHTHALMOLOGY EXAM  11/17/2022   Fecal DNA (Cologuard)  11/03/2023   Lung Cancer Screening  12/03/2023   HEMOGLOBIN A1C  03/10/2024   INFLUENZA VACCINE  03/26/2024   DTaP/Tdap/Td (2 - Td or Tdap) 08/14/2024   Diabetic kidney evaluation - Urine ACR  09/10/2024   FOOT EXAM  09/10/2024   Diabetic kidney evaluation - eGFR measurement  10/06/2024   Hepatitis C Screening  Completed   HIV Screening  Completed   HPV VACCINES  Aged Out   Meningococcal B Vaccine  Aged Out    Objective: BP (!) 153/93   Pulse 73   Temp 97.9 F (36.6 C) (Temporal)   Ht 5\' 10"  (1.778 m)   SpO2 94%   BMI 27.26 kg/m   Physical Exam Constitutional:  older than stated age, malnourished   HENT:     Head: Normocephalic and atraumatic.      Mouth: Mucous membranes are moist.  Eyes:    Conjunctiva/sclera: Conjunctivae normal.     Pupils:   Cardiovascular:     Rate and Rhythm: Normal rate and regular rhythm.     Heart sounds:  Pulmonary:     Effort: Pulmonary effort is normal.     Breath sounds:   Abdominal:     General: Non distended     Palpations:   Musculoskeletal:        General: sitting in a wheel chair and briefly able to stand without support but needs support to walk. No changes in power of lower extremities   Skin:    General: Skin is warm and dry. No wounds in b/l feet  Neurological:     General:     Mental Status: awake, alert and oriented to person, place, and time.   Psychiatric:         Mood and Affect: Mood normal.   Lab Results Lab Results  Component Value Date   WBC 5.4 10/07/2023   HGB 13.0 (L) 10/07/2023   HCT 38.2 (L) 10/07/2023   MCV 84.3 10/07/2023   PLT 327 10/07/2023    Lab Results  Component Value Date   CREATININE 0.73 10/07/2023   BUN 10 10/07/2023   NA 138 10/07/2023   K 4.1 10/07/2023   CL 100 10/07/2023   CO2 30 10/07/2023    Lab Results  Component Value Date   ALT 15 02/23/2023   AST 17 02/23/2023   ALKPHOS 85 02/23/2023   BILITOT 0.9 02/23/2023    Lab Results  Component Value Date   CHOL 145 09/11/2023   HDL 59 09/11/2023   LDLCALC 68 09/11/2023   TRIG 100 09/11/2023   CHOLHDL 2.5 09/11/2023   No results found for: "LABRPR", "RPRTITER" No results found for: "HIV1RNAQUANT", "HIV1RNAVL", "CD4TABS"   Microbiology  Results for orders placed or performed during the hospital encounter of 02/22/23  Culture, blood (Routine X 2) w Reflex to ID Panel     Status: None   Collection Time: 02/23/23  7:38 AM   Specimen: BLOOD  Result Value Ref Range Status   Specimen Description BLOOD SITE NOT SPECIFIED  Final   Special Requests   Final    BOTTLES DRAWN AEROBIC AND ANAEROBIC Blood Culture adequate volume   Culture   Final    NO GROWTH 5 DAYS Performed at [facility], [address] [place], [place] [address]    Report Status 02/28/2023 FINAL  Final  Culture, blood (Routine X 2) w  Reflex to ID Panel     Status: None   Collection Time: 02/23/23  7:40 AM   Specimen: BLOOD  Result Value Ref Range Status   Specimen Description BLOOD SITE NOT SPECIFIED  Final   Special Requests   Final    BOTTLES DRAWN AEROBIC AND ANAEROBIC Blood Culture adequate volume   Culture   Final    NO GROWTH 5 DAYS Performed at [facility], [address], [place], [place] [address]    Report Status 02/28/2023 FINAL  Final  Aerobic/Anaerobic Culture w Gram Stain (surgical/deep wound)     Status: None   Collection Time: 03/05/23 10:04 AM    Specimen: Fine Needle Aspirate  Result Value Ref Range Status   Specimen Description NEEDLE ASPIRATE  Final   Special Requests NONE  Final   Gram Stain   Final    FEW WBC PRESENT, PREDOMINANTLY PMN NO ORGANISMS SEEN    Culture   Final    No growth aerobically or anaerobically. Performed at [facility], [address] [place], [place] [address]    Report Status 03/10/2023 FINAL  Final  Aerobic/Anaerobic Culture w Gram Stain (surgical/deep wound)     Status: None   Collection Time: 03/05/23 10:07 AM   Specimen: Fine Needle Aspirate  Result Value Ref Range Status   Specimen Description NEEDLE ASPIRATE  Final   Special Requests  IDISC  Final   Gram Stain   Final    RARE WBC PRESENT, PREDOMINANTLY PMN NO ORGANISMS SEEN    Culture   Final    No growth aerobically or anaerobically. Performed at [facility], [address], [place], [place] [address]    Report Status 03/10/2023 FINAL  Final  Acid Fast Smear (AFB)     Status: None   Collection Time: 03/05/23 10:09 AM   Specimen: Intervertebral Disc; Fine Needle Aspirate  Result Value Ref Range Status   AFB Specimen Processing Concentration  Final   Acid Fast Smear Negative  Final    Comment: (NOTE) Performed At: [facility] [address] [place], [place] [id number] Pearlean Botts MD OZ:[id number]    Source (AFB) NEEDLE ASPIRATE  Final    Comment: Performed at [facility], [address] [place], [place] [address]  Acid Fast Culture with reflexed sensitivities     Status: None   Collection Time: 03/05/23 10:09 AM   Specimen: Intervertebral Disc; Fine Needle Aspirate  Result Value Ref Range Status   Acid Fast Culture Negative  Final    Comment: (NOTE) No acid fast bacilli isolated after 6 weeks. Performed At: [facility] [address] [place], [place] [id number] Pearlean Botts MD UU:[id number]    Source of Sample NEEDLE ASPIRATE  Final    Comment: Performed at [facility]  Lab, [address] [place], [place] [address]  Fungus Culture With Stain     Status: None   Collection Time: 03/05/23 10:09 AM   Specimen: Intervertebral Disc  Result Value Ref Range Status   Fungus Stain Final report  Final   Fungus (Mycology) Culture Final report  Final    Comment: (NOTE) Performed At: [facility] [address] [place], [place] [id number] Pearlean Botts MD PP:[id number]    Fungal Source NEEDLE ASPIRATE  Final    Comment: Performed at [facility], [address] [place], [place] [address]  Fungus Culture Result     Status: None   Collection Time: 03/05/23 10:09  AM  Result Value Ref Range Status   Result 1 Comment  Final    Comment: (NOTE) KOH/Calcofluor preparation:  no fungus observed. Performed At: [facility] [address] [place], [place] [id number] Pearlean Botts MD WU:[id number]   Fungal organism reflex     Status: None   Collection Time: 03/05/23 10:09 AM  Result Value Ref Range Status   Fungal result 1 Comment  Final    Comment: (NOTE) No yeast or mold isolated after 4 weeks. Performed At: [facility] [address] [place], [place] [id number] Pearlean Botts MD VH:[id number]   Fungus Culture With Stain     Status: None   Collection Time: 03/05/23 12:25 PM  Result Value Ref Range Status   Fungus Stain Final report  Final   Fungus (Mycology) Culture Final report  Final    Comment: (NOTE) Performed At: [facility] [address] [place], [place] [id number] Pearlean Botts MD UV:[id number]    Fungal Source NEEDLE ASPIRATE  Final    Comment: Performed at [facility], [address], [place], [place] [address]  Acid Fast Smear (AFB)     Status: None   Collection Time: 03/05/23 12:25 PM   Specimen: Intervertebral Disc; Fine Needle Aspirate  Result Value Ref Range Status   AFB Specimen Processing Concentration  Final   Acid Fast Smear Negative  Final    Comment: (NOTE) Performed At: [facility] [address] [place], [place] [id number] Pearlean Botts MD PI:[id number]    Source (AFB) NEEDLE ASPIRATE  Final    Comment: Performed at [facility], [address], [place], [place] [address]  Acid Fast Culture with reflexed sensitivities     Status: None   Collection Time: 03/05/23 12:25 PM   Specimen: Intervertebral Disc; Fine Needle Aspirate  Result Value Ref Range Status   Acid Fast Culture Negative  Final    Comment: (NOTE) No acid fast bacilli isolated after 6 weeks. Performed At: [facility] [address] [place], [place] [id number] Pearlean Botts MD DD:[id number]    Source of Sample NEEDLE ASPIRATE  Final    Comment: Performed at [facility], [address], [place], [place] [address]  Fungus Culture Result     Status: None   Collection Time: 03/05/23 12:25 PM  Result Value Ref Range Status   Result 1 Comment  Final    Comment: (NOTE) KOH/Calcofluor preparation:  no fungus observed. Performed At: [facility] [address] [place], [place] [id number] Pearlean Botts MD WV:[id number]   Fungal organism reflex     Status: None   Collection Time: 03/05/23 12:25 PM  Result Value Ref Range Status   Fungal result 1 Comment  Final    Comment: (NOTE) No yeast or mold isolated after 4 weeks. Performed At: [facility] [address] [place], [place] [id number] Pearlean Botts MD KX:[id number]    Imaging MRI L spine 02/25/23 IMPRESSION: 1. Findings consistent with discitis/osteomyelitis of the lower thoracic spine, progressed since prior thoracic MRI (September 2023). The T11-12 level, which was included on prior MRI of the lumbar spine (May 2024) appear stable. 2. Phlegmon surrounding the thecal sac results in moderate narrowing at T10-11 and severe at T11-12, resulting in mass effect on the cord without cord signal abnormality. 3. Interval resolution of ventral epidural fluid collection at the L1 level. 4.  Degenerative changes of the thoracic spine with moderate spinal canal stenosis at T5-6. 5. Degenerative changes of the lumbar spine  with moderate bilateral neural foraminal narrowing at L1-2 and L2-3 and moderate left neural foraminal narrowing at L5-S1.   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 Meds refilled  -will decrease ciprofloxacin  from 750mg  po bid to 500mg  po bid and continue for another 6 months of so.   -Discussed again to get MRI T spine soon and get in touch with Neurosurgery office regarding arranging MRI -Fu in 2 month  # Medication Monitoring  -labs today -10/07/23 EKG  qtc 421  -Have discussed side effects of fluoroquinolones at length inlcuding arthropathy/arthralgia, CNS effects, C diff infection, glucose dysregulation, hepatotoxicity, aortic aneurysm. Discussed to fu with PCP if needs screening sooner than 65 years for Aortic Aneurysm since on prolonged ciprofloxacin    I have personally spent 30 minutes involved in face-to-face and non-face-to-face activities for this patient on the day of the visit. Professional time spent includes the following activities: Preparing to see the patient (review of tests), Obtaining and/or reviewing separately obtained history (admission/discharge record), Performing a medically appropriate examination and/or evaluation , Ordering medications/tests/procedures, referring and communicating with other health care professionals, Documenting clinical information in the EMR, Independently interpreting results (not separately reported), Communicating results to the patient/family/caregiver, Counseling and educating the patient/family/caregiver and Care coordination (not separately reported).   Melvina Stage, MD [facility] 12/19/2023, 9:51 AM

## 2023-12-20 LAB — CBC
HCT: 41.7 % (ref 38.5–50.0)
Hemoglobin: 14.3 g/dL (ref 13.2–17.1)
MCH: 29.2 pg (ref 27.0–33.0)
MCHC: 34.3 g/dL (ref 32.0–36.0)
MCV: 85.3 fL (ref 80.0–100.0)
MPV: 10.1 fL (ref 7.5–12.5)
Platelets: 307 10*3/uL (ref 140–400)
RBC: 4.89 10*6/uL (ref 4.20–5.80)
RDW: 12.5 % (ref 11.0–15.0)
WBC: 6.7 10*3/uL (ref 3.8–10.8)

## 2023-12-20 LAB — COMPREHENSIVE METABOLIC PANEL WITH GFR
AG Ratio: 1.7 (calc) (ref 1.0–2.5)
ALT: 21 U/L (ref 9–46)
AST: 23 U/L (ref 10–35)
Albumin: 4.6 g/dL (ref 3.6–5.1)
Alkaline phosphatase (APISO): 80 U/L (ref 35–144)
BUN: 10 mg/dL (ref 7–25)
CO2: 25 mmol/L (ref 20–32)
Calcium: 9.7 mg/dL (ref 8.6–10.3)
Chloride: 101 mmol/L (ref 98–110)
Creat: 0.84 mg/dL (ref 0.70–1.30)
Globulin: 2.7 g/dL (ref 1.9–3.7)
Glucose, Bld: 181 mg/dL — ABNORMAL HIGH (ref 65–99)
Potassium: 4.4 mmol/L (ref 3.5–5.3)
Sodium: 137 mmol/L (ref 135–146)
Total Bilirubin: 0.7 mg/dL (ref 0.2–1.2)
Total Protein: 7.3 g/dL (ref 6.1–8.1)
eGFR: 102 mL/min/{1.73_m2} (ref >=60)

## 2023-12-25 ENCOUNTER — Other Ambulatory Visit (HOSPITAL_COMMUNITY): Payer: Self-pay

## 2024-01-05 ENCOUNTER — Encounter (HOSPITAL_COMMUNITY): Payer: Self-pay

## 2024-01-07 ENCOUNTER — Other Ambulatory Visit: Payer: Self-pay

## 2024-01-07 ENCOUNTER — Other Ambulatory Visit (HOSPITAL_COMMUNITY): Payer: Self-pay

## 2024-01-07 MED ORDER — OXYCODONE-ACETAMINOPHEN 10-325 MG PO TABS
1.0000 | ORAL_TABLET | Freq: Every day | ORAL | 0 refills | Status: DC
Start: 2024-01-07 — End: 2024-02-05
  Filled 2024-01-08: qty 150, 30d supply, fill #0

## 2024-01-07 MED ORDER — BUPRENORPHINE 7.5 MCG/HR TD PTWK
1.0000 | MEDICATED_PATCH | TRANSDERMAL | 0 refills | Status: DC
  Filled 2024-01-07 – 2024-01-08 (×2): qty 4, 28d supply, fill #0

## 2024-01-07 MED ORDER — HYDROXYZINE HCL 10 MG PO TABS
10.0000 mg | ORAL_TABLET | Freq: Three times a day (TID) | ORAL | 2 refills | Status: DC
Start: 2024-01-07 — End: 2024-03-31
  Filled 2024-01-07: qty 90, 30d supply, fill #0
  Filled 2024-02-05: qty 90, 30d supply, fill #1
  Filled 2024-03-03: qty 90, 30d supply, fill #2

## 2024-01-07 MED ORDER — BACLOFEN 5 MG PO TABS
5.0000 mg | ORAL_TABLET | Freq: Three times a day (TID) | ORAL | 2 refills | Status: DC
  Filled 2024-01-07: qty 90, 30d supply, fill #0
  Filled 2024-02-05: qty 90, 30d supply, fill #1
  Filled 2024-03-03: qty 90, 30d supply, fill #2

## 2024-01-08 ENCOUNTER — Other Ambulatory Visit: Payer: Self-pay

## 2024-01-08 ENCOUNTER — Other Ambulatory Visit (HOSPITAL_COMMUNITY): Payer: Self-pay

## 2024-01-12 ENCOUNTER — Ambulatory Visit: Payer: Medicare HMO | Admitting: Family

## 2024-02-05 ENCOUNTER — Other Ambulatory Visit (HOSPITAL_COMMUNITY): Payer: Self-pay

## 2024-02-05 ENCOUNTER — Other Ambulatory Visit: Payer: Self-pay

## 2024-02-05 MED ORDER — OXYCODONE-ACETAMINOPHEN 10-325 MG PO TABS
1.0000 | ORAL_TABLET | ORAL | 0 refills | Status: DC
  Filled 2024-02-05: qty 150, 30d supply, fill #0

## 2024-02-05 MED ORDER — GABAPENTIN 300 MG PO CAPS
300.0000 mg | ORAL_CAPSULE | Freq: Two times a day (BID) | ORAL | 2 refills | Status: DC
  Filled 2024-02-05 (×2): qty 60, 30d supply, fill #0

## 2024-02-05 MED ORDER — BUPRENORPHINE 7.5 MCG/HR TD PTWK
1.0000 | MEDICATED_PATCH | TRANSDERMAL | 0 refills | Status: DC
  Filled 2024-02-05: qty 4, 28d supply, fill #0

## 2024-02-06 ENCOUNTER — Other Ambulatory Visit (HOSPITAL_COMMUNITY): Payer: Self-pay

## 2024-02-06 ENCOUNTER — Ambulatory Visit (INDEPENDENT_AMBULATORY_CARE_PROVIDER_SITE_OTHER): Admitting: Family

## 2024-02-06 VITALS — BP 145/85 | HR 66 | Temp 98.2°F | Ht 70.0 in | Wt 196.2 lb

## 2024-02-06 DIAGNOSIS — E785 Hyperlipidemia, unspecified: Secondary | ICD-10-CM

## 2024-02-06 DIAGNOSIS — R5381 Other malaise: Secondary | ICD-10-CM

## 2024-02-06 DIAGNOSIS — I10 Essential (primary) hypertension: Secondary | ICD-10-CM

## 2024-02-06 DIAGNOSIS — F339 Major depressive disorder, recurrent, unspecified: Secondary | ICD-10-CM | POA: Diagnosis not present

## 2024-02-06 DIAGNOSIS — F419 Anxiety disorder, unspecified: Secondary | ICD-10-CM | POA: Diagnosis not present

## 2024-02-06 DIAGNOSIS — M48062 Spinal stenosis, lumbar region with neurogenic claudication: Secondary | ICD-10-CM

## 2024-02-06 DIAGNOSIS — E1169 Type 2 diabetes mellitus with other specified complication: Secondary | ICD-10-CM | POA: Diagnosis not present

## 2024-02-06 DIAGNOSIS — I5032 Chronic diastolic (congestive) heart failure: Secondary | ICD-10-CM

## 2024-02-06 LAB — BAYER DCA HB A1C WAIVED: HB A1C (BAYER DCA - WAIVED): 7.3 % — ABNORMAL HIGH (ref 4.8–5.6)

## 2024-02-06 NOTE — Progress Notes (Signed)
 Subjective:    Patient ID: John Ware, male    DOB: [birth year], 58 y.o.   MRN: [id number]  Chief Complaint  Patient presents with   Medical Management of Chronic Issues   PT presents to the office today for chronic follow up.   He is followed by Pain Clinic every month for chronic back pain. Has hx of lumbar stenosis.    He is followed by ID for psoas muscle abscess. He is currently taking cipro  BID and cefadroxil  daily.  He is followed by Ortho as needed.   He is complaining of ED and requesting medication today.  Hypertension This is a chronic problem. The current episode started more than 1 year ago. The problem has been waxing and waning since onset. The problem is uncontrolled. Associated symptoms include anxiety. Pertinent negatives include no blurred vision, malaise/fatigue, peripheral edema or shortness of breath. Risk factors for coronary artery disease include dyslipidemia, male gender and sedentary lifestyle. The current treatment provides moderate improvement. Hypertensive end-organ damage includes heart failure.  Congestive Heart Failure Presents for follow-up visit. Associated symptoms include fatigue. Pertinent negatives include no edema or shortness of breath. The symptoms have been stable.  Hyperlipidemia This is a chronic problem. The current episode started more than 1 year ago. The problem is controlled. Recent lipid tests were reviewed and are normal. Pertinent negatives include no shortness of breath. Current antihyperlipidemic treatment includes diet change and statins. The current treatment provides moderate improvement of lipids. Risk factors for coronary artery disease include dyslipidemia, hypertension, male sex and a sedentary lifestyle.  Diabetes He presents for his follow-up diabetic visit. He has type 2 diabetes mellitus. Hypoglycemia symptoms include nervousness/anxiousness. Associated symptoms include fatigue and foot paresthesias. Pertinent negatives  for diabetes include no blurred vision. Symptoms are stable. Diabetic complications include peripheral neuropathy. Risk factors for coronary artery disease include dyslipidemia, diabetes mellitus, hypertension, male sex and sedentary lifestyle. He is following a generally healthy diet. His overall blood glucose range is 140-180 mg/dl. Eye exam is current.  Anemia Presents for follow-up visit. There has been no malaise/fatigue. Past medical history includes heart failure.  Back Pain This is a chronic problem. The current episode started more than 1 year ago. The problem occurs intermittently. The problem has been waxing and waning since onset. The pain is present in the lumbar spine. The quality of the pain is described as aching. The pain is at a severity of 1/10. The pain is mild. He has tried analgesics for the symptoms. The treatment provided moderate relief.  Depression        This is a chronic problem.  The current episode started more than 1 year ago.   The onset quality is gradual.   The problem occurs intermittently.  Associated symptoms include fatigue, decreased interest and sad.  Associated symptoms include no helplessness and no hopelessness.  Past medical history includes anxiety.   Anxiety Presents for follow-up visit. Symptoms include excessive worry, irritability and nervous/anxious behavior. Patient reports no shortness of breath. Symptoms occur occasionally. The severity of symptoms is mild.        Review of Systems  Constitutional:  Positive for fatigue and irritability. Negative for malaise/fatigue.  Eyes:  Negative for blurred vision.  Respiratory:  Negative for shortness of breath.   Musculoskeletal:  Positive for back pain.  Psychiatric/Behavioral:  The patient is nervous/anxious.   All other systems reviewed and are negative.      Objective:   Physical Exam Vitals reviewed.  Constitutional:      General: He is not in acute distress.    Appearance: He is  underweight.     Comments: Body appearance  HENT:     Head: Normocephalic.     Right Ear: Tympanic membrane normal.     Left Ear: Tympanic membrane normal.   Eyes:     General:        Right eye: No discharge.        Left eye: No discharge.     Pupils: Pupils are equal, round, and reactive to light.   Neck:     Thyroid : No thyromegaly.   Cardiovascular:     Rate and Rhythm: Normal rate and regular rhythm.     Heart sounds: Normal heart sounds. No murmur heard. Pulmonary:     Effort: Pulmonary effort is normal. No respiratory distress.     Breath sounds: Normal breath sounds. No wheezing.  Abdominal:     General: Bowel sounds are normal. There is no distension.     Palpations: Abdomen is soft.     Tenderness: There is no abdominal tenderness.   Musculoskeletal:        General: No tenderness. Normal range of motion.     Cervical back: Normal range of motion and neck supple.   Skin:    General: Skin is warm and dry.     Coloration: Skin is pale.     Findings: No erythema or rash.   Neurological:     Mental Status: He is alert and oriented to person, place, and time.     Cranial Nerves: No cranial nerve deficit.     Motor: Weakness (in wheelchair) present.     Gait: Gait abnormal.     Deep Tendon Reflexes: Reflexes are normal and symmetric.   Psychiatric:        Behavior: Behavior normal.        Thought Content: Thought content normal.        Judgment: Judgment normal.       BP (!) 142/82   Pulse 66   Temp 98.2 F (36.8 C) (Temporal)   Ht 5' 10 (1.778 m)   Wt 196 lb 3.2 oz (89 kg)   SpO2 96%   BMI 28.15 kg/m      Assessment & Plan:  John Ware comes in today with chief complaint of Medical Management of Chronic Issues   Diagnosis and orders addressed: 1. Spinal stenosis of lumbar region with neurogenic claudication - CMP14+EGFR  2. Hyperlipidemia associated with type 2 diabetes mellitus (HCC) - CMP14+EGFR  3. Depression, recurrent (HCC) -  CMP14+EGFR  4. Type 2 diabetes mellitus with other specified complication, without long-term current use of insulin  (HCC) (Primary)  - CMP14+EGFR - Bayer DCA Hb A1c Waived  5. Anxiety - CMP14+EGFR  6. Primary hypertension - CMP14+EGFR  7. Physical deconditioning  - CMP14+EGFR  8. Chronic diastolic CHF (congestive heart failure) (HCC) - CMP14+EGFR  Labs pending Encouraged strengthening exercises  Continue current medications  Keep follow up with specialists  Health Maintenance reviewed Diet and exercise encouraged  Follow up plan: 4 months    Tommas Fragmin, FNP

## 2024-02-06 NOTE — Patient Instructions (Signed)
 Back Exercises The following exercises strengthen the muscles that help to support the trunk (torso) and back. They also help to keep the lower back flexible. Doing these exercises can help to prevent or lessen existing low back pain. If you have back pain or discomfort, try doing these exercises 2-3 times each day or as told by your health care provider. As your pain improves, do them once each day, but increase the number of times that you repeat the steps for each exercise (do more repetitions). To prevent the recurrence of back pain, continue to do these exercises once each day or as told by your health care provider. Do exercises exactly as told by your health care provider and adjust them as directed. It is normal to feel mild stretching, pulling, tightness, or discomfort as you do these exercises, but you should stop right away if you feel sudden pain or your pain gets worse. Exercises Single knee to chest Repeat these steps 3-5 times for each leg: Lie on your back on a firm bed or the floor with your legs extended. Bring one knee to your chest. Your other leg should stay extended and in contact with the floor. Hold your knee in place by grabbing your knee or thigh with both hands and hold. Pull on your knee until you feel a gentle stretch in your lower back or buttocks. Hold the stretch for 10-30 seconds. Slowly release and straighten your leg.  Pelvic tilt Repeat these steps 5-10 times: Lie on your back on a firm bed or the floor with your legs extended. Bend your knees so they are pointing toward the ceiling and your feet are flat on the floor. Tighten your lower abdominal muscles to press your lower back against the floor. This motion will tilt your pelvis so your tailbone points up toward the ceiling instead of pointing to your feet or the floor. With gentle tension and even breathing, hold this position for 5-10 seconds.  Cat-cow Repeat these steps until your lower back becomes  more flexible: Get into a hands-and-knees position on a firm bed or the floor. Keep your hands under your shoulders, and keep your knees under your hips. You may place padding under your knees for comfort. Let your head hang down toward your chest. Contract your abdominal muscles and point your tailbone toward the floor so your lower back becomes rounded like the back of a cat. Hold this position for 5 seconds. Slowly lift your head, let your abdominal muscles relax, and point your tailbone up toward the ceiling so your back forms a sagging arch like the back of a cow. Hold this position for 5 seconds.  Press-ups Repeat these steps 5-10 times: Lie on your abdomen (face-down) on a firm bed or the floor. Place your palms near your head, about shoulder-width apart. Keeping your back as relaxed as possible and keeping your hips on the floor, slowly straighten your arms to raise the top half of your body and lift your shoulders. Do not use your back muscles to raise your upper torso. You may adjust the placement of your hands to make yourself more comfortable. Hold this position for 5 seconds while you keep your back relaxed. Slowly return to lying flat on the floor.  Bridges Repeat these steps 10 times: Lie on your back on a firm bed or the floor. Bend your knees so they are pointing toward the ceiling and your feet are flat on the floor. Your arms should be flat  at your sides, next to your body. Tighten your buttocks muscles and lift your buttocks off the floor until your waist is at almost the same height as your knees. You should feel the muscles working in your buttocks and the back of your thighs. If you do not feel these muscles, slide your feet 1-2 inches (2.5-5 cm) farther away from your buttocks. Hold this position for 3-5 seconds. Slowly lower your hips to the starting position, and allow your buttocks muscles to relax completely. If this exercise is too easy, try doing it with your arms  crossed over your chest. Abdominal crunches Repeat these steps 5-10 times: Lie on your back on a firm bed or the floor with your legs extended. Bend your knees so they are pointing toward the ceiling and your feet are flat on the floor. Cross your arms over your chest. Tip your chin slightly toward your chest without bending your neck. Tighten your abdominal muscles and slowly raise your torso high enough to lift your shoulder blades a tiny bit off the floor. Avoid raising your torso higher than that because it can put too much stress on your lower back and does not help to strengthen your abdominal muscles. Slowly return to your starting position.  Back lifts Repeat these steps 5-10 times: Lie on your abdomen (face-down) with your arms at your sides, and rest your forehead on the floor. Tighten the muscles in your legs and your buttocks. Slowly lift your chest off the floor while you keep your hips pressed to the floor. Keep the back of your head in line with the curve in your back. Your eyes should be looking at the floor. Hold this position for 3-5 seconds. Slowly return to your starting position.  Contact a health care provider if: Your back pain or discomfort gets much worse when you do an exercise. Your worsening back pain or discomfort does not lessen within 2 hours after you exercise. If you have any of these problems, stop doing these exercises right away. Do not do them again unless your health care provider says that you can. Get help right away if: You develop sudden, severe back pain. If this happens, stop doing the exercises right away. Do not do them again unless your health care provider says that you can. This information is not intended to replace advice given to you by your health care provider. Make sure you discuss any questions you have with your health care provider. Document Revised: 09/15/2022 Document Reviewed: 10/25/2020 [employer] Patient Education  2024 [employer].

## 2024-02-07 LAB — CMP14+EGFR
ALT: 15 IU/L (ref 0–44)
AST: 23 IU/L (ref 0–40)
Albumin: 4.5 g/dL (ref 3.8–4.9)
Alkaline Phosphatase: 108 IU/L (ref 44–121)
BUN/Creatinine Ratio: 12 (ref 9–20)
BUN: 10 mg/dL (ref 6–24)
Bilirubin Total: 0.5 mg/dL (ref 0.0–1.2)
CO2: 23 mmol/L (ref 20–29)
Calcium: 9.4 mg/dL (ref 8.7–10.2)
Chloride: 102 mmol/L (ref 96–106)
Creatinine, Ser: 0.81 mg/dL (ref 0.76–1.27)
Globulin, Total: 2.4 g/dL (ref 1.5–4.5)
Glucose: 142 mg/dL — ABNORMAL HIGH (ref 70–99)
Potassium: 4.7 mmol/L (ref 3.5–5.2)
Sodium: 139 mmol/L (ref 134–144)
Total Protein: 6.9 g/dL (ref 6.0–8.5)
eGFR: 103 mL/min/{1.73_m2} (ref >59)

## 2024-02-09 ENCOUNTER — Ambulatory Visit: Payer: Self-pay | Admitting: Family

## 2024-02-09 MED ORDER — METFORMIN HCL ER 500 MG PO TB24: 500.0000 mg | ORAL_TABLET | Freq: Every day | ORAL | 2 refills | Status: DC

## 2024-02-23 ENCOUNTER — Ambulatory Visit: Admitting: Infectious Diseases

## 2024-02-24 ENCOUNTER — Ambulatory Visit: Admitting: Infectious Diseases

## 2024-02-25 ENCOUNTER — Other Ambulatory Visit (HOSPITAL_COMMUNITY): Payer: Self-pay

## 2024-02-26 ENCOUNTER — Other Ambulatory Visit (HOSPITAL_COMMUNITY): Payer: Self-pay

## 2024-02-26 ENCOUNTER — Other Ambulatory Visit: Payer: Self-pay

## 2024-02-26 ENCOUNTER — Encounter: Payer: Self-pay | Admitting: Infectious Diseases

## 2024-02-26 ENCOUNTER — Ambulatory Visit: Admitting: Infectious Diseases

## 2024-02-26 VITALS — BP 118/73 | HR 62 | Temp 98.4°F

## 2024-02-26 DIAGNOSIS — A498 Other bacterial infections of unspecified site: Secondary | ICD-10-CM

## 2024-02-26 DIAGNOSIS — A4901 Methicillin susceptible Staphylococcus aureus infection, unspecified site: Secondary | ICD-10-CM | POA: Diagnosis not present

## 2024-02-26 DIAGNOSIS — M4624 Osteomyelitis of vertebra, thoracic region: Secondary | ICD-10-CM

## 2024-02-26 DIAGNOSIS — Z79899 Other long term (current) drug therapy: Secondary | ICD-10-CM

## 2024-02-26 MED ORDER — CIPROFLOXACIN HCL 500 MG PO TABS
500.0000 mg | ORAL_TABLET | Freq: Two times a day (BID) | ORAL | 1 refills | Status: DC
Start: 2024-02-26 — End: 2024-05-06
  Filled 2024-02-26 – 2024-03-03 (×2): qty 60, 30d supply, fill #0
  Filled 2024-04-01: qty 60, 30d supply, fill #1

## 2024-02-26 NOTE — Progress Notes (Addendum)
 Patient Active Problem List   Diagnosis Date Noted   Physical deconditioning 04/07/2023   Hypokalemia 02/22/2023   Chronic back pain 01/30/2023   Intractable back pain 12/04/2022   Vertebral osteomyelitis (HCC) 12/04/2022   Pseudomonas aeruginosa infection 07/07/2022   Osteomyelitis of thoracic region [facility]) 07/03/2022   Osteomyelitis of lumbar spine (HCC) 05/08/2022   Numbness    Chronic diastolic CHF (congestive heart failure) (HCC) 04/23/2022   Chronic pain 04/23/2022   Osteomyelitis of vertebra of thoracolumbar region (HCC) 04/22/2022   Presence of retained hardware 10/17/2021   Infective myositis 08/29/2021   Discitis of thoracolumbar region 07/31/2021   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   MRSA infection    Diabetic peripheral neuropathy (HCC)    Septic discitis of lumbar region 06/11/2021   Infection of spine (HCC) 05/28/2021   Medication monitoring encounter 05/09/2021   History of COVID-19    Psoas abscess (HCC)    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   Right shoulder pain 02/14/2021   Normocytic anemia 02/14/2021   Thrombocytosis 02/14/2021   Endocarditis 02/05/2021   Vision changes 02/04/2021   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   Pressure injury of skin 02/01/2021   HTN (hypertension) 02/01/2021   Anxiety 02/01/2021   Left shoulder pain 02/01/2021   Hyponatremia 01/31/2021   Positive colorectal cancer screening using Cologuard test 12/28/2020   DOE (dyspnea on exertion) 10/20/2020   Diabetic polyneuropathy associated with type 2 diabetes mellitus (HCC) 10/09/2020   DM2 (diabetes mellitus, type 2) (HCC) 10/30/2018   Lumbar stenosis with neurogenic claudication 10/14/2017   DDD (degenerative disc disease), lumbar 02/17/2017   Transient weakness of right lower extremity 02/05/2017   Hyperlipidemia associated with type 2 diabetes mellitus (HCC) 10/24/2016   Depression, recurrent (HCC) 10/24/2016   Right knee pain 05/21/2016   DDD (degenerative disc  disease), lumbosacral 05/21/2016   Herniation of nucleus pulposus of cervical intervertebral disc without myelopathy 02/29/2016   Carpal tunnel syndrome 02/08/2016   Cervical spondylosis with myelopathy 01/17/2016   Lumbar stenosis 12/13/2014   Current Outpatient Medications on File Prior to Visit  Medication Sig Dispense Refill   acetaminophen  (TYLENOL ) 500 MG tablet Take 1 tablet (500 mg total) by mouth every 6 (six) hours as needed. 30 tablet 0   Baclofen  5 MG TABS Take 2 tablets (10 mg total) by mouth 3 (three) times daily. 90 tablet 2   Baclofen  5 MG TABS Take 1 tablet (5 mg total) by mouth 3 (three) times daily. 90 tablet 2   buprenorphine  (BUTRANS ) 7.5 MCG/HR apply 1 patch to skin once a week 4 patch 0   buprenorphine  (BUTRANS ) 7.5 MCG/HR Place 1 patch onto the skin once a week. 4 patch 0   cefadroxil  (DURICEF) 500 MG capsule Take 1 capsule (500 mg total) by mouth 2 (two) times daily. 60 capsule 11   ciprofloxacin  (CIPRO ) 500 MG tablet Take 1 tablet (500 mg total) by mouth 2 (two) times daily. 60 tablet 1   Continuous Glucose Sensor (FREESTYLE LIBRE 3 SENSOR) MISC 1 Device by Does not apply route every 14 (fourteen) days. Place 1 sensor on the skin every 14 days. Use to check glucose continuously 2 each 6   gabapentin  (NEURONTIN ) 300 MG capsule Take 1 capsule (300 mg total) by mouth 2 (two) times daily. 60 capsule 2   hydrOXYzine  (ATARAX ) 10 MG tablet Take 1 tablet (10 mg total) by mouth 3 (three) times daily. 90 tablet 2   metFORMIN  (GLUCOPHAGE -XR) 500 MG 24 hr tablet Take 1  tablet (500 mg total) by mouth daily with breakfast. 90 tablet 2   naloxone  (NARCAN ) 0.4 MG/ML injection Inject 1 mL (0.4 mg total) into the vein as needed. 1 mL 0   naloxone  (NARCAN ) nasal spray 4 mg/0.1 mL Use 1 spray in one nostril for single dose as needed. May repeat every 2-3 minutes until patient responsive or EMS arrives. 1 each 1   oxyCODONE -acetaminophen  (PERCOCET) 10-325 MG tablet Take 1 tablet by mouth 5  times daily as needed. 95 tablet 0   oxyCODONE -acetaminophen  (PERCOCET) 10-325 MG tablet Take 1 tablet by mouth five times a day 150 tablet 0   oxyCODONE -acetaminophen  (PERCOCET) 10-325 MG tablet Take 1 tablet by mouth 5 (five) times daily. 15 tablet 0   sildenafil  (VIAGRA ) 100 MG tablet Take 0.5-1 tablets (50-100 mg total) by mouth daily as needed for erectile dysfunction. 30 tablet 6   No current facility-administered medications on file prior to visit.   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   Admitted on 04/22/2022 with inability to walk, BLE numbness, intermittent fevers and urinary incontinence x3 weeks.  He was found to have acute discitis T9/10-T11/T12 with epidural phlegmon/abscess, septic arthritis T11-T12, extensive enhancement paraspinal soft tissue, as well as soft tissue abscess. 8/30 S/p fluoroscopy guided T1-11 disc aspiration ( Cx PsA), 8/31 aspiration of left back superficial abscess ( Cx PsA). Blood cx 8/28 1/2 sets staph pseudintermedius/epidermidis. This was followed by I&D of subcut abscess with decompression and debridement of osteomyelitis via T10-T11 laminectomy on 09/07 by Dr. Colon ( OR cx NG). Seen by ID and antibiotics switched to cefepime  and planned for 6 weeks course EOT 10/19 with plan to continue Ciprofloxacin  thereafter. Patient  was also continued on doxycycline  for hardware associated lumbar osteomyelitis. Patient was discharged to CIR due to functional decline on 9/12 and then discharged home from rehab on 9/22.   Res-started on ciprofloxacin  750mg  po bid on 11/8 due to concerns of persistent infection in the setting of elevated inflammatory markers and continued to take PO doxycycline  as is without any concerns.    Admitted 4/9-4/15/24 with intractable back pain. 4/9 MRI L spine with discitis, osteomyelitis in lower T and Lumbar spine, epidural phlegmon. 5.9 x 2.0 x 2.0 cm fluid collection spanning the L4-L5 interspinous space, at the site of prior laminectomy, which could reflect a postoperative seroma, although abscess is not excluded.This is unchanged compared to 04/22/22. S/p IR aspiration 4/11,  cx grew MSSE. Discharged on cefazolin  for 6 weeks. EOT 6/6 and transitioned to cefadroxil .   Admitted 6/29-7/11/24 for intractable back pain to get MRI.  MRI had to be performed with anesthesia on 7/2.  MRI findings with discitis/osteomyelitis of the lower T-spine progressed, phlegmon surrounding the thecal sac resulting in moderate narrowing of T10-T11 and severe at T11-T12 resulting in mass effect of the cord without cord signal abnormality, interval resolution of the ventral epidural fluid collection at the L1 level. Per neurosurgery surgical intervention would be high risk  and recommended conservative management.  S/p CT-guided aspiration on 7/10 under anesthesia, cultures with no growth.  Seen by ID and was discharged on IV cefepime  for a total of 8 weeks, EOT 04/23/2023. Cefepime  switched to PO ciprofloxacin  and cefadroxil  after completion of IV course.  Taking ciprofloxacin  750 mg po bid and cefadroxil  500mg  po bid  without missing doses or concerns. He is still unable to walk independently.  08/05/23 Seen by Dr Colon 11/13 and MRI T spine has been ordered  12/19/23 Accompanied by wife. Back pain is stable. He has not  followed up with Nsx yet and has not been able to do MRI yet as unable to find open MRI. Told to discuss with Neurosurgery about this and they will call his office today. He has been compliant with both cefadroxil  and ciprofloxacin  except missed 2 doses during Easter. He is able to walk at home with walker and uses wheelchair outside home. He is able to stand briefly without support but difficult to walk. He says numbness in his left lower extremity has improved, only limited to left foot ( previously up to thigh). No wounds in bilateral feet. No new concerns.   7/3 Accompanied by wife. Taking cefadroxil  and ciprofloxacin  as instructed. Has not been able to get MRI yet, have not seen Nsx yet. He is now able to walk few steps with cane and pleased with the progress. Seen by PCP on 6/13.  No new concerns.   Review of Systems: all systems reviewed with pertinent positives and negatives as listed above   Past Medical History:  Diagnosis Date   Depression    Diabetes mellitus without complication (HCC)    Hypertension    Spinal stenosis    With Neurogenic Claudication   Past Surgical History:  Procedure Laterality Date   CARPAL TUNNEL RELEASE Bilateral    INCISION AND DRAINAGE ABSCESS Left 03/12/2021   Procedure: INCISION AND DRAINAGE ABSCESS;  Surgeon: Josefina Chew, MD;  Location: [facility];  Service: Orthopedics;  Laterality: Left;   IR FLUORO GUIDED NEEDLE PLC ASPIRATION/INJECTION LOC  04/24/2022   IR FLUORO GUIDED NEEDLE PLC ASPIRATION/INJECTION LOC  12/05/2022   IR FLUORO GUIDED NEEDLE PLC ASPIRATION/INJECTION LOC  03/05/2023   IR FLUORO GUIDED NEEDLE PLC ASPIRATION/INJECTION LOC  03/05/2023   IR US  GUIDE BX ASP/DRAIN  02/02/2021   IRRIGATION AND DEBRIDEMENT ABSCESS Left 02/08/2021   Procedure: MINOR INCISION AND DRAINAGE OF ABSCESS;  Surgeon: Josefina Chew, MD;  [facility]: [facility];  Service: Orthopedics;  Laterality: Left;   IRRIGATION AND DEBRIDEMENT ABSCESS Left 02/10/2021   Procedure:  IRRIGATION AND DEBRIDEMENT ABSCESS LEFT LATERAL CHEST WALL;  Surgeon: Tanda Locus, MD;  [facility]: [facility];  Service: General;  Laterality: Left;   IRRIGATION AND DEBRIDEMENT ABSCESS Bilateral 02/20/2021   Procedure: MINOR INCISION AND DRAINAGE OF ABSCESS/WITH BILATERAL WOUND VAC PLACEMENT;  Surgeon: Josefina Chew, MD;  Location: [facility];  Service: Orthopedics;  Laterality: Bilateral;   IRRIGATION AND DEBRIDEMENT SHOULDER Bilateral 02/28/2021   Procedure: IRRIGATION AND DEBRIDEMENT SHOULDER;  Surgeon: Cristy Bonner DASEN, MD;  Location: [facility];  Service: Orthopedics;  Laterality: Bilateral;   IRRIGATION AND DEBRIDEMENT SHOULDER Left 03/06/2021   Procedure: IRRIGATION AND DEBRIDEMENT SHOULDER;  Surgeon: Beverley Evalene BIRCH, MD;  [facility]: [facility];  Service: Orthopedics;  Laterality: Left;   IRRIGATION AND DEBRIDEMENT SHOULDER Left 03/08/2021   Procedure: IRRIGATION AND DEBRIDEMENT SHOULDER;  Surgeon: Josefina Chew, MD;  Location: [facility];  Service: Orthopedics;  Laterality: Left;   LUMBAR LAMINECTOMY FOR EPIDURAL  ABSCESS N/A 05/02/2022   Procedure: THORACIC  LAMINECTOMY AND DEBRIDEMENT  FOR EPIDURAL ABSCESS;  Surgeon: Colon Shove, MD;  Location: [facility];  Service: Neurosurgery;  Laterality: N/A;   LUMBAR LAMINECTOMY/DECOMPRESSION MICRODISCECTOMY N/A 12/13/2014   Procedure: LUMBAR THREE-FOUR, LUMBAR FOUR-FIVE, LUMBAR FIVE-SACRAL ONE LAMINECTOMY;  Surgeon: Shove Colon, MD;  Location: [facility];  Service: Neurosurgery;  Laterality: N/A;  L3-4 L4-5 L5-S1 Laminectomy   LUMBAR LAMINECTOMY/DECOMPRESSION MICRODISCECTOMY Left 06/05/2021   Procedure: Left lumbar one-two, Lumbar two-three Laminectomy;  Surgeon: Colon Shove, MD;  Location: [facility];  Service: Neurosurgery;  Laterality: Left;   MENISCUS REPAIR     Right knee   MINOR APPLICATION OF WOUND VAC Left 02/20/2021   Procedure: MINOR APPLICATION OF KERLIX PACKING/REMOVAL OF WOUND VAC;  Surgeon: Gladis Cough, MD;  Location: [facility];  Service: General;  Laterality:  Left;   RADIOLOGY WITH ANESTHESIA N/A 02/25/2023   Procedure: MRI WITH ANESTHESIA OF THORACIC AND LUMBAR WITH AND WITHOUT CONTRAST;  Surgeon: Radiologist, Medication, MD;  Location: [facility];  Service: Radiology;  Laterality: N/A;   RADIOLOGY WITH ANESTHESIA N/A 03/05/2023   Procedure: disc aspiration;  Surgeon: de Macedo Rodrigues, Katyucia, MD;  Location: [facility] OR;  Service: Radiology;  Laterality: N/A;   TEE WITHOUT CARDIOVERSION N/A 02/05/2021   Procedure: TRANSESOPHAGEAL ECHOCARDIOGRAM (TEE);  Surgeon: Shlomo Wilbert SAUNDERS, MD;  Location: [facility];  Service: Cardiovascular;  Laterality: N/A;   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   TOTAL SHOULDER ARTHROPLASTY Right 02/10/2021   Procedure: RIGHT OPEN SHOULDER I & D, OPEN DISTAL CLAVICLE RESECTION;  Surgeon: Josefina Chew, MD;  [facility]: [facility];  Service: Orthopedics;  Laterality: Right;   VASECTOMY      Social History   Tobacco Use   Smoking status: Former    Current packs/day: 0.00    Average packs/day: 2.0 packs/day for 15.0 years (30.0 ttl pk-yrs)    Types: Cigarettes    Start date: 40    Quit date: 2012    Years since quitting: 13.5    Passive exposure: Never   Smokeless tobacco: Never  Vaping Use   Vaping status: Never Used  Substance Use Topics   Alcohol use: Yes    Comment: occasional   Drug use: No    Family History  Problem Relation Age of Onset   Cancer Mother    Colon cancer Neg Hx     Allergies  Allergen Reactions   Tizanidine  Other (See Comments)    Leg cramps    Health Maintenance  Topic Date Due   COVID-19 Vaccine (3 - Moderna risk series) 02/14/2020   OPHTHALMOLOGY EXAM  11/17/2022   Medicare Annual Wellness (AWV)  03/07/2024 (Originally [birth year])   Zoster Vaccines- Shingrix (1 of 2) 05/08/2024 (Originally 06/26/1986)   Lung Cancer Screening  02/05/2025 (Originally 12/03/2023)   Pneumococcal Vaccine 61-64 Years old (2 of 2 - PCV) 02/05/2025 (Originally 06/23/2021)   Fecal DNA (Cologuard)   02/05/2025 (Originally 11/03/2023)   INFLUENZA VACCINE  03/26/2024   HEMOGLOBIN A1C  08/07/2024   DTaP/Tdap/Td (2 - Td or Tdap) 08/14/2024   Diabetic kidney evaluation - Urine ACR  09/10/2024   FOOT EXAM  09/10/2024   Diabetic kidney evaluation - eGFR measurement  02/05/2025   Hepatitis B Vaccines  Completed   Hepatitis C Screening  Completed   HIV Screening  Completed   HPV VACCINES  Aged Out   Meningococcal B Vaccine  Aged Out    Objective: BP 118/73  Pulse 62   Temp 98.4 F (36.9 C) (Oral)   SpO2 96%    Physical Exam Constitutional:  older than stated age, malnourished   HENT:     Head: Normocephalic and atraumatic.      Mouth: Mucous membranes are moist.  Eyes:    Conjunctiva/sclera: Conjunctivae normal.     Pupils:   Cardiovascular:     Rate and Rhythm: Normal rate and regular rhythm.     Heart sounds:  Pulmonary:     Effort: Pulmonary effort is normal.     Breath sounds:   Abdominal:     General: Non distended     Palpations:   Musculoskeletal:        General: sitting in a wheel chair and able to walk few steps with a cane  Skin:    General: Skin is warm and dry. No wounds in b/l feet  Neurological:     General:     Mental Status: awake, alert and oriented to person, place, and time.   Psychiatric:        Mood and Affect: Mood normal.   Lab Results Lab Results  Component Value Date   WBC 6.7 12/19/2023   HGB 14.3 12/19/2023   HCT 41.7 12/19/2023   MCV 85.3 12/19/2023   PLT 307 12/19/2023    Lab Results  Component Value Date   CREATININE 0.81 02/06/2024   BUN 10 02/06/2024   NA 139 02/06/2024   K 4.7 02/06/2024   CL 102 02/06/2024   CO2 23 02/06/2024    Lab Results  Component Value Date   ALT 15 02/06/2024   AST 23 02/06/2024   ALKPHOS 108 02/06/2024   BILITOT 0.5 02/06/2024    Lab Results  Component Value Date   CHOL 145 09/11/2023   HDL 59 09/11/2023   LDLCALC 68 09/11/2023   TRIG 100 09/11/2023   CHOLHDL 2.5 09/11/2023    No results found for: LABRPR, RPRTITER No results found for: HIV1RNAQUANT, HIV1RNAVL, CD4TABS   Microbiology  Results for orders placed or performed during the hospital encounter of 02/22/23  Culture, blood (Routine X 2) w Reflex to ID Panel     Status: None   Collection Time: 02/23/23  7:38 AM   Specimen: BLOOD  Result Value Ref Range Status   Specimen Description BLOOD SITE NOT SPECIFIED  Final   Special Requests   Final    BOTTLES DRAWN AEROBIC AND ANAEROBIC Blood Culture adequate volume   Culture   Final    NO GROWTH 5 DAYS Performed at [facility], [address], [place], [place] [address]    Report Status 02/28/2023 FINAL  Final  Culture, blood (Routine X 2) w Reflex to ID Panel     Status: None   Collection Time: 02/23/23  7:40 AM   Specimen: BLOOD  Result Value Ref Range Status   Specimen Description BLOOD SITE NOT SPECIFIED  Final   Special Requests   Final    BOTTLES DRAWN AEROBIC AND ANAEROBIC Blood Culture adequate volume   Culture   Final    NO GROWTH 5 DAYS Performed at [facility], [address], [place], [place] [address]    Report Status 02/28/2023 FINAL  Final  Aerobic/Anaerobic Culture w Gram Stain (surgical/deep wound)     Status: None   Collection Time: 03/05/23 10:04 AM   Specimen: Fine Needle Aspirate  Result Value Ref Range Status   Specimen Description NEEDLE ASPIRATE  Final  Special Requests NONE  Final   Gram Stain   Final    FEW WBC PRESENT, PREDOMINANTLY PMN NO ORGANISMS SEEN    Culture   Final    No growth aerobically or anaerobically. Performed at [facility], [address], [place], [place] [address]    Report Status 03/10/2023 FINAL  Final  Aerobic/Anaerobic Culture w Gram Stain (surgical/deep wound)     Status: None   Collection Time: 03/05/23 10:07 AM   Specimen: Fine Needle Aspirate  Result Value Ref Range Status   Specimen Description NEEDLE ASPIRATE  Final   Special Requests  IDISC  Final    Gram Stain   Final    RARE WBC PRESENT, PREDOMINANTLY PMN NO ORGANISMS SEEN    Culture   Final    No growth aerobically or anaerobically. Performed at [facility] Lab, [address] [place], [place] [address]    Report Status 03/10/2023 FINAL  Final  Acid Fast Smear (AFB)     Status: None   Collection Time: 03/05/23 10:09 AM   Specimen: Intervertebral Disc; Fine Needle Aspirate  Result Value Ref Range Status   AFB Specimen Processing Concentration  Final   Acid Fast Smear Negative  Final    Comment: (NOTE) Performed At: [facility] [address] [place], [place] [id number] Jennette Shorter MD Ey:[id number]    Source (AFB) NEEDLE ASPIRATE  Final    Comment: Performed at [facility] Lab, [address], [place], [place] [address]  Acid Fast Culture with reflexed sensitivities     Status: None   Collection Time: 03/05/23 10:09 AM   Specimen: Intervertebral Disc; Fine Needle Aspirate  Result Value Ref Range Status   Acid Fast Culture Negative  Final    Comment: (NOTE) No acid fast bacilli isolated after 6 weeks. Performed At: [facility] [address] [place], [place] [id number] Jennette Shorter MD Ey:[id number]    Source of Sample NEEDLE ASPIRATE  Final    Comment: Performed at [facility] Lab, [address], [place], [place] [address]  Fungus Culture With Stain     Status: None   Collection Time: 03/05/23 10:09 AM   Specimen: Intervertebral Disc  Result Value Ref Range Status   Fungus Stain Final report  Final   Fungus (Mycology) Culture Final report  Final    Comment: (NOTE) Performed At: [facility] [address] [place], [place] [id number] Jennette Shorter MD Ey:[id number]    Fungal Source NEEDLE ASPIRATE  Final    Comment: Performed at [facility] Lab, [address] [place], [place] [address]  Fungus Culture Result     Status: None   Collection Time: 03/05/23 10:09 AM  Result Value Ref Range Status   Result 1 Comment  Final     Comment: (NOTE) KOH/Calcofluor preparation:  no fungus observed. Performed At: [facility] [address] [place], [place] [id number] Jennette Shorter MD Ey:[id number]   Fungal organism reflex     Status: None   Collection Time: 03/05/23 10:09 AM  Result Value Ref Range Status   Fungal result 1 Comment  Final    Comment: (NOTE) No yeast or mold isolated after 4 weeks. Performed At: [facility] [address] [place], [place] [id number] Jennette Shorter MD Ey:[id number]   Fungus Culture With Stain     Status: None   Collection Time: 03/05/23 12:25 PM  Result Value Ref Range Status   Fungus Stain Final report  Final  Fungus (Mycology) Culture Final report  Final    Comment: (NOTE) Performed At: [facility] [address] [place], [place] [id number] Jennette Shorter MD Ey:[id number]    Fungal Source NEEDLE ASPIRATE  Final    Comment: Performed at [facility], [address] [place], [place] [address]  Acid Fast Smear (AFB)     Status: None   Collection Time: 03/05/23 12:25 PM   Specimen: Intervertebral Disc; Fine Needle Aspirate  Result Value Ref Range Status   AFB Specimen Processing Concentration  Final   Acid Fast Smear Negative  Final    Comment: (NOTE) Performed At: [facility] [address] [place], [place] [id number] Jennette Shorter MD Ey:[id number]    Source (AFB) NEEDLE ASPIRATE  Final    Comment: Performed at [facility] Lab, [address], [place], [place] [address]  Acid Fast Culture with reflexed sensitivities     Status: None   Collection Time: 03/05/23 12:25 PM   Specimen: Intervertebral Disc; Fine Needle Aspirate  Result Value Ref Range Status   Acid Fast Culture Negative  Final    Comment: (NOTE) No acid fast bacilli isolated after 6 weeks. Performed At: [facility] [address] [place], [place] [id number] Jennette Shorter MD Ey:[id number]    Source of Sample NEEDLE ASPIRATE  Final    Comment:  Performed at [facility] Lab, [address] [place], [place] [address]  Fungus Culture Result     Status: None   Collection Time: 03/05/23 12:25 PM  Result Value Ref Range Status   Result 1 Comment  Final    Comment: (NOTE) KOH/Calcofluor preparation:  no fungus observed. Performed At: [facility] [address] [place], [place] [id number] Jennette Shorter MD Ey:[id number]   Fungal organism reflex     Status: None   Collection Time: 03/05/23 12:25 PM  Result Value Ref Range Status   Fungal result 1 Comment  Final    Comment: (NOTE) No yeast or mold isolated after 4 weeks. Performed At: [facility] [address] [place], [place] [id number] Jennette Shorter MD Ey:[id number]    Imaging MRI L spine 02/25/23 IMPRESSION: 1. Findings consistent with discitis/osteomyelitis of the lower thoracic spine, progressed since prior thoracic MRI (September 2023). The T11-12 level, which was included on prior MRI of the lumbar spine (May 2024) appear stable. 2. Phlegmon surrounding the thecal sac results in moderate narrowing at T10-11 and severe at T11-12, resulting in mass effect on the cord without cord signal abnormality. 3. Interval resolution of ventral epidural fluid collection at the L1 level. 4. Degenerative changes of the thoracic spine with moderate spinal canal stenosis at T5-6. 5. Degenerative changes of the lumbar spine with moderate bilateral neural foraminal narrowing at L1-2 and L2-3 and moderate left neural foraminal narrowing at L5-S1.   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  -continue ciprofloxacin  500mg  po bid, will plan to stop around end of aug/early sept by which time he would  have completed a year of ciprofloxacin . Hopefully he will be able to get MRI before that time and nothing concerning -Fu in 2 month  # Medication  Monitoring  -labs today -10/07/23 EKG  qtc 421  -Have discussed side effects of fluoroquinolones at length inlcuding arthropathy/arthralgia, CNS effects, C diff infection, glucose dysregulation, hepatotoxicity, aortic aneurysm. Discussed to fu with PCP if needs screening sooner than 65 years for  Aortic Aneurysm since on prolonged ciprofloxacin    I spent 21  minutes involved in face-to-face and non-face-to-face activities for this patient on the day of the visit. Professional time spent includes the following activities: Preparing to see the patient (review of tests),  Performing a medically appropriate examination and evaluation, reviewing notes from PCP, Ordering medications/labs, Documenting clinical information in the EMR, Independently interpreting results (not separately reported), Communicating results to the patient/wife, Counseling and educating the patient/wife and Care coordination (not separately reported).   Annalee Joseph, MD [facility] 02/26/2024, 2:12 PM

## 2024-02-27 ENCOUNTER — Ambulatory Visit: Payer: Self-pay | Admitting: Infectious Diseases

## 2024-02-27 LAB — CBC
HCT: 41.3 % (ref 38.5–50.0)
Hemoglobin: 13.8 g/dL (ref 13.2–17.1)
MCH: 29.1 pg (ref 27.0–33.0)
MCHC: 33.4 g/dL (ref 32.0–36.0)
MCV: 87.1 fL (ref 80.0–100.0)
MPV: 10 fL (ref 7.5–12.5)
Platelets: 379 Thousand/uL (ref 140–400)
RBC: 4.74 Million/uL (ref 4.20–5.80)
RDW: 13.1 % (ref 11.0–15.0)
WBC: 7.1 Thousand/uL (ref 3.8–10.8)

## 2024-02-27 LAB — COMPREHENSIVE METABOLIC PANEL WITH GFR
AG Ratio: 2 (calc) (ref 1.0–2.5)
ALT: 12 U/L (ref 9–46)
AST: 13 U/L (ref 10–35)
Albumin: 4.7 g/dL (ref 3.6–5.1)
Alkaline phosphatase (APISO): 84 U/L (ref 35–144)
BUN: 9 mg/dL (ref 7–25)
CO2: 27 mmol/L (ref 20–32)
Calcium: 10 mg/dL (ref 8.6–10.3)
Chloride: 101 mmol/L (ref 98–110)
Creat: 0.71 mg/dL (ref 0.70–1.30)
Globulin: 2.4 g/dL (ref 1.9–3.7)
Glucose, Bld: 134 mg/dL — ABNORMAL HIGH (ref 65–99)
Potassium: 4.6 mmol/L (ref 3.5–5.3)
Sodium: 139 mmol/L (ref 135–146)
Total Bilirubin: 0.5 mg/dL (ref 0.2–1.2)
Total Protein: 7.1 g/dL (ref 6.1–8.1)
eGFR: 108 mL/min/1.73m2 (ref >=60)

## 2024-02-27 LAB — C-REACTIVE PROTEIN: CRP: 4.5 mg/L (ref <8.0)

## 2024-03-02 ENCOUNTER — Other Ambulatory Visit: Payer: Self-pay

## 2024-03-02 ENCOUNTER — Other Ambulatory Visit (HOSPITAL_COMMUNITY): Payer: Self-pay

## 2024-03-02 MED ORDER — BUPRENORPHINE 7.5 MCG/HR TD PTWK
1.0000 | MEDICATED_PATCH | TRANSDERMAL | 0 refills | Status: DC
  Filled 2024-03-02 – 2024-03-04 (×3): qty 4, 28d supply, fill #0

## 2024-03-02 MED ORDER — OXYCODONE-ACETAMINOPHEN 10-325 MG PO TABS
1.0000 | ORAL_TABLET | Freq: Every day | ORAL | 0 refills | Status: DC
  Filled 2024-03-03 – 2024-03-04 (×2): qty 150, 30d supply, fill #0

## 2024-03-03 ENCOUNTER — Other Ambulatory Visit: Payer: Self-pay

## 2024-03-03 ENCOUNTER — Other Ambulatory Visit (HOSPITAL_COMMUNITY): Payer: Self-pay

## 2024-03-04 ENCOUNTER — Other Ambulatory Visit (HOSPITAL_COMMUNITY): Payer: Self-pay

## 2024-03-04 ENCOUNTER — Other Ambulatory Visit: Payer: Self-pay

## 2024-03-05 ENCOUNTER — Other Ambulatory Visit (HOSPITAL_COMMUNITY): Payer: Self-pay

## 2024-03-05 ENCOUNTER — Other Ambulatory Visit: Payer: Self-pay

## 2024-03-06 ENCOUNTER — Other Ambulatory Visit (HOSPITAL_COMMUNITY): Payer: Self-pay

## 2024-03-10 ENCOUNTER — Other Ambulatory Visit (HOSPITAL_COMMUNITY): Payer: Self-pay

## 2024-03-15 ENCOUNTER — Other Ambulatory Visit (HOSPITAL_COMMUNITY): Payer: Self-pay

## 2024-03-16 ENCOUNTER — Other Ambulatory Visit (HOSPITAL_COMMUNITY): Payer: Self-pay

## 2024-03-25 ENCOUNTER — Other Ambulatory Visit (HOSPITAL_COMMUNITY): Payer: Self-pay

## 2024-03-31 ENCOUNTER — Other Ambulatory Visit (HOSPITAL_COMMUNITY): Payer: Self-pay

## 2024-03-31 MED ORDER — BACLOFEN 5 MG PO TABS
5.0000 mg | ORAL_TABLET | Freq: Three times a day (TID) | ORAL | 2 refills | Status: DC
  Filled 2024-03-31: qty 90, 30d supply, fill #0
  Filled 2024-04-29: qty 90, 30d supply, fill #1
  Filled 2024-05-25: qty 90, 30d supply, fill #2

## 2024-03-31 MED ORDER — HYDROXYZINE HCL 10 MG PO TABS
10.0000 mg | ORAL_TABLET | Freq: Three times a day (TID) | ORAL | 2 refills | Status: DC
  Filled 2024-03-31: qty 90, 30d supply, fill #0
  Filled 2024-04-29: qty 90, 30d supply, fill #1
  Filled 2024-05-25: qty 90, 30d supply, fill #2

## 2024-03-31 MED ORDER — OXYCODONE-ACETAMINOPHEN 10-325 MG PO TABS
1.0000 | ORAL_TABLET | Freq: Every day | ORAL | 0 refills | Status: AC
  Filled 2024-03-31: qty 150, 30d supply, fill #0

## 2024-03-31 MED ORDER — BUPRENORPHINE 7.5 MCG/HR TD PTWK
1.0000 | MEDICATED_PATCH | TRANSDERMAL | 0 refills | Status: DC
  Filled 2024-03-31 – 2024-04-02 (×2): qty 4, 28d supply, fill #0

## 2024-04-02 ENCOUNTER — Other Ambulatory Visit (HOSPITAL_COMMUNITY): Payer: Self-pay

## 2024-04-02 MED ORDER — FREESTYLE LIBRE 3 PLUS SENSOR MISC: 2 refills | Status: AC

## 2024-04-06 ENCOUNTER — Other Ambulatory Visit (HOSPITAL_COMMUNITY): Payer: Self-pay

## 2024-04-06 ENCOUNTER — Telehealth: Payer: Self-pay

## 2024-04-06 ENCOUNTER — Telehealth: Payer: Self-pay | Admitting: Family Medicine

## 2024-04-06 MED ORDER — GABAPENTIN 300 MG PO CAPS: 300.0000 mg | ORAL_CAPSULE | Freq: Two times a day (BID) | ORAL | 2 refills | Status: DC

## 2024-04-06 NOTE — Telephone Encounter (Signed)
 Copied from CRM #[id number]. Topic: Clinical - Medication Question >> Apr 06, 2024  1:31 PM Delon DASEN wrote: Reason for CRM: returned call about medication

## 2024-04-06 NOTE — Telephone Encounter (Signed)
 Called patient had to leave message

## 2024-04-06 NOTE — Telephone Encounter (Signed)
 Gabapentin  on medication list? Unsure how it was cancelled. I just sent a refill to [facility].

## 2024-04-06 NOTE — Telephone Encounter (Signed)
 Patient is aware that gabapentin  was sent.

## 2024-04-06 NOTE — Telephone Encounter (Signed)
 Copied from CRM [id number]. Topic: Clinical - Prescription Issue >> Apr 06, 2024 12:02 PM John Ware wrote: Reason for CRM: Patient was told by [facility] that [facility] cancelled his  gabapentin  (NEURONTIN ) 300 MG capsule. The last refill was on 05/29/23 by Debby Beard. Patient is requesting a call back at [phone]

## 2024-04-07 NOTE — Telephone Encounter (Signed)
 Refill sent.

## 2024-04-13 ENCOUNTER — Encounter

## 2024-04-13 NOTE — Progress Notes (Signed)
 This encounter was created in error - please disregard.per ptthis is bull shit! Does not want to do it

## 2024-04-14 ENCOUNTER — Other Ambulatory Visit (HOSPITAL_COMMUNITY): Payer: Self-pay

## 2024-04-20 ENCOUNTER — Other Ambulatory Visit (HOSPITAL_COMMUNITY): Payer: Self-pay

## 2024-04-22 ENCOUNTER — Other Ambulatory Visit (HOSPITAL_COMMUNITY): Payer: Self-pay

## 2024-04-29 ENCOUNTER — Other Ambulatory Visit (HOSPITAL_COMMUNITY): Payer: Self-pay

## 2024-04-29 ENCOUNTER — Other Ambulatory Visit: Payer: Self-pay

## 2024-04-29 ENCOUNTER — Other Ambulatory Visit: Payer: Self-pay | Admitting: Infectious Diseases

## 2024-04-29 MED ORDER — BUPRENORPHINE 7.5 MCG/HR TD PTWK
1.0000 | MEDICATED_PATCH | TRANSDERMAL | 1 refills | Status: AC
  Filled 2024-04-29: qty 4, 28d supply, fill #0
  Filled 2024-05-25: qty 4, 28d supply, fill #1

## 2024-04-29 MED ORDER — OXYCODONE-ACETAMINOPHEN 10-325 MG PO TABS
1.0000 | ORAL_TABLET | Freq: Every day | ORAL | 0 refills | Status: AC | PRN
  Filled 2024-05-27: qty 150, 32d supply, fill #0
  Filled 2024-05-28: qty 150, 30d supply, fill #0

## 2024-04-29 MED ORDER — OXYCODONE-ACETAMINOPHEN 10-325 MG PO TABS
1.0000 | ORAL_TABLET | Freq: Every day | ORAL | 0 refills | Status: AC | PRN
  Filled 2024-04-29: qty 150, 30d supply, fill #0

## 2024-04-30 ENCOUNTER — Other Ambulatory Visit: Payer: Self-pay

## 2024-04-30 ENCOUNTER — Other Ambulatory Visit (HOSPITAL_COMMUNITY): Payer: Self-pay

## 2024-05-06 ENCOUNTER — Encounter: Payer: Self-pay | Admitting: Infectious Diseases

## 2024-05-06 ENCOUNTER — Other Ambulatory Visit (HOSPITAL_COMMUNITY): Payer: Self-pay

## 2024-05-06 ENCOUNTER — Ambulatory Visit: Admitting: Infectious Diseases

## 2024-05-06 ENCOUNTER — Other Ambulatory Visit: Payer: Self-pay

## 2024-05-06 VITALS — BP 134/53 | HR 72 | Temp 98.3°F | Wt 193.2 lb

## 2024-05-06 DIAGNOSIS — M4644 Discitis, unspecified, thoracic region: Secondary | ICD-10-CM

## 2024-05-06 DIAGNOSIS — A4901 Methicillin susceptible Staphylococcus aureus infection, unspecified site: Secondary | ICD-10-CM | POA: Diagnosis not present

## 2024-05-06 DIAGNOSIS — Z79899 Other long term (current) drug therapy: Secondary | ICD-10-CM | POA: Diagnosis not present

## 2024-05-06 DIAGNOSIS — M4624 Osteomyelitis of vertebra, thoracic region: Secondary | ICD-10-CM

## 2024-05-06 DIAGNOSIS — A498 Other bacterial infections of unspecified site: Secondary | ICD-10-CM

## 2024-05-06 MED ORDER — CIPROFLOXACIN HCL 500 MG PO TABS
500.0000 mg | ORAL_TABLET | Freq: Two times a day (BID) | ORAL | 1 refills | Status: DC
  Filled 2024-05-06: qty 60, 30d supply, fill #0
  Filled 2024-05-25 – 2024-06-01 (×2): qty 60, 30d supply, fill #1

## 2024-05-06 NOTE — Progress Notes (Signed)
 Patient Active Problem List   Diagnosis Date Noted   Medication management 02/26/2024   MSSA (methicillin susceptible Staphylococcus aureus) infection 02/26/2024   Physical deconditioning 04/07/2023   Hypokalemia 02/22/2023   Chronic back pain 01/30/2023   Intractable back pain 12/04/2022   Vertebral osteomyelitis (HCC) 12/04/2022   Pseudomonas aeruginosa infection 07/07/2022   Osteomyelitis of thoracic region [facility]) 07/03/2022   Osteomyelitis of lumbar spine (HCC) 05/08/2022   Numbness    Chronic diastolic CHF (congestive heart failure) (HCC) 04/23/2022   Chronic pain 04/23/2022   Osteomyelitis of vertebra of thoracolumbar region (HCC) 04/22/2022   Presence of retained hardware 10/17/2021   Infective myositis 08/29/2021   Discitis of thoracolumbar region 07/31/2021   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   MRSA infection    Diabetic peripheral neuropathy (HCC)    Septic discitis of lumbar region 06/11/2021   Infection of spine (HCC) 05/28/2021   Medication monitoring encounter 05/09/2021   History of COVID-19    Psoas abscess (HCC)    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   Right shoulder pain 02/14/2021   Normocytic anemia 02/14/2021   Thrombocytosis 02/14/2021   Endocarditis 02/05/2021   Vision changes 02/04/2021   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   Pressure injury of skin 02/01/2021   HTN (hypertension) 02/01/2021   Anxiety 02/01/2021   Left shoulder pain 02/01/2021   Hyponatremia 01/31/2021   Positive colorectal cancer screening using Cologuard test 12/28/2020   DOE (dyspnea on exertion) 10/20/2020   Diabetic polyneuropathy associated with type 2 diabetes mellitus (HCC) 10/09/2020   DM2 (diabetes mellitus, type 2) (HCC) 10/30/2018   Lumbar stenosis with neurogenic claudication 10/14/2017   DDD (degenerative disc disease), lumbar 02/17/2017   Transient weakness of right lower extremity 02/05/2017   Hyperlipidemia associated with type 2 diabetes mellitus (HCC)  10/24/2016   Depression, recurrent (HCC) 10/24/2016   Right knee pain 05/21/2016   DDD (degenerative disc disease), lumbosacral 05/21/2016   Herniation of nucleus pulposus of cervical intervertebral disc without myelopathy 02/29/2016   Carpal tunnel syndrome 02/08/2016   Cervical spondylosis with myelopathy 01/17/2016   Lumbar stenosis 12/13/2014   Current Outpatient Medications on File Prior to Visit  Medication Sig Dispense Refill   Baclofen  5 MG TABS Take 2 tablets (10 mg total) by mouth 3 (three) times daily. 90 tablet 2   Baclofen  5 MG TABS Take 1 tablet (5 mg total) by mouth 3 (three) times daily. 90 tablet 2   buprenorphine  (BUTRANS ) 7.5 MCG/HR apply 1 patch to skin once a week 4 patch 0   buprenorphine  (BUTRANS ) 7.5 MCG/HR Place 1 patch onto the skin every 7 (seven) days. 4 patch 1   cefadroxil  (DURICEF) 500 MG capsule Take 1 capsule (500 mg total) by mouth 2 (two) times daily. 60 capsule 11   ciprofloxacin  (CIPRO ) 500 MG tablet Take 1 tablet (500 mg total) by mouth 2 (two) times daily. 60 tablet 1   Continuous Glucose Sensor (FREESTYLE LIBRE 3 PLUS SENSOR) MISC Change sensor every 15 days. 6 each 2   gabapentin  (NEURONTIN ) 300 MG capsule Take 1 capsule (300 mg total) by mouth 2 (two) times daily. 60 capsule 2   hydrOXYzine  (ATARAX ) 10 MG tablet Take 1 tablet (10 mg total) by mouth 3 (three) times daily. 90 tablet 2   metFORMIN  (GLUCOPHAGE -XR) 500 MG 24 hr tablet Take 1 tablet (500 mg total) by mouth daily with breakfast. 90 tablet 2   naloxone  (NARCAN ) 0.4 MG/ML injection Inject 1 mL (0.4 mg total) into the vein  as needed. 1 mL 0   naloxone  (NARCAN ) nasal spray 4 mg/0.1 mL Use 1 spray in one nostril for single dose as needed. May repeat every 2-3 minutes until patient responsive or EMS arrives. 1 each 1   oxyCODONE -acetaminophen  (PERCOCET) 10-325 MG tablet Take 1 tablet by mouth 5 times daily as needed. 95 tablet 0   oxyCODONE -acetaminophen  (PERCOCET) 10-325 MG tablet Take 1 tablet  by mouth five times a day 150 tablet 0   oxyCODONE -acetaminophen  (PERCOCET) 10-325 MG tablet Take 1 tablet by mouth 5 (five) times daily. 15 tablet 0   oxyCODONE -acetaminophen  (PERCOCET) 10-325 MG tablet Take 1 tablet by mouth 5 (five) times daily. 150 tablet 0   oxyCODONE -acetaminophen  (PERCOCET) 10-325 MG tablet Take 1 tablet by mouth 5 (five) times daily as needed for pain 150 tablet 0   [START ON 05/27/2024] oxyCODONE -acetaminophen  (PERCOCET) 10-325 MG tablet Take 1 tablet by mouth 5 (five) times daily as needed for pain 150 tablet 0   sildenafil  (VIAGRA ) 100 MG tablet Take 0.5-1 tablets (50-100 mg total) by mouth daily as needed for erectile dysfunction. 30 tablet 6   No current facility-administered medications on file prior to visit.   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   Admitted on 04/22/2022 with inability to walk, BLE numbness, intermittent fevers and urinary incontinence x3 weeks.  He was found to have acute discitis T9/10-T11/T12 with epidural phlegmon/abscess, septic arthritis T11-T12, extensive enhancement paraspinal soft tissue, as well as soft tissue abscess. 8/30 S/p fluoroscopy guided T1-11 disc aspiration ( Cx PsA), 8/31 aspiration of left back superficial abscess ( Cx PsA). Blood cx 8/28 1/2 sets staph pseudintermedius/epidermidis. This was  followed by I&D of subcut abscess with decompression and debridement of osteomyelitis via T10-T11 laminectomy on 09/07 by Dr. Colon ( OR cx NG). Seen by ID and antibiotics switched to cefepime  and planned for 6 weeks course EOT 10/19 with plan to continue Ciprofloxacin  thereafter. Patient was also continued on doxycycline  for hardware associated lumbar osteomyelitis. Patient was discharged to CIR due to functional decline on 9/12 and then discharged home from rehab on 9/22.   Res-started on ciprofloxacin  750mg  po bid on 11/8 due to concerns of persistent infection in the setting of elevated inflammatory markers and continued to take PO doxycycline  as is without any concerns.    Admitted 4/9-4/15/24 with intractable back pain. 4/9 MRI L spine with discitis, osteomyelitis in lower T and Lumbar spine, epidural phlegmon. 5.9 x 2.0 x 2.0 cm fluid collection spanning the L4-L5 interspinous space, at the site of prior laminectomy, which could reflect a postoperative seroma, although abscess is not excluded.This is unchanged compared to 04/22/22. S/p IR aspiration 4/11,  cx grew MSSE. Discharged on cefazolin  for 6 weeks. EOT 6/6 and transitioned to cefadroxil .   Admitted 6/29-7/11/24 for intractable back pain to get MRI.  MRI had to be performed with anesthesia on 7/2.  MRI findings with discitis/osteomyelitis of the lower T-spine progressed, phlegmon surrounding the thecal sac resulting  in moderate narrowing of T10-T11 and severe at T11-T12 resulting in mass effect of the cord without cord signal abnormality, interval resolution of the ventral epidural fluid collection at the L1 level. Per neurosurgery surgical intervention would be high risk and recommended conservative management.  S/p CT-guided aspiration on 7/10 under anesthesia, cultures with no growth.  Seen by ID and was discharged on IV cefepime  for a total of 8 weeks, EOT 04/23/2023. Cefepime  switched to PO ciprofloxacin  and cefadroxil  after completion of IV  course.  Taking ciprofloxacin  750 mg po bid and cefadroxil  500mg  po bid  without missing doses or concerns. He is still unable to walk independently.  08/05/23 Seen by Dr Colon 11/13 and MRI T spine has been ordered  12/19/23 Accompanied by wife. Back pain is stable. He has not followed up with Nsx yet and has not been able to do MRI yet as unable to find open MRI. Told to discuss with Neurosurgery about this and they will call his office today. He has been compliant with both cefadroxil  and ciprofloxacin  except missed 2 doses during Easter. He is able to walk at home with walker and uses wheelchair outside home. He is able to stand briefly without support but difficult to walk. He says numbness in his left lower extremity has improved, only limited to left foot ( previously up to thigh). No wounds in bilateral feet. No new concerns.   7/3 Accompanied by wife. Taking cefadroxil  and ciprofloxacin  as instructed. Has not been able to get MRI yet, have not seen Nsx yet. He is now able to walk few steps with cane and pleased with the progress. Seen by PCP on 6/13.  No new concerns.   9/5 Accompanied by wife. Compliant with cefadroxil  and ciprofloxacin . Back pain stable, here and there. He is able to walk with support. MRI is still pending and he is going to contact with Nsx for scheduling. No other concerns.   Review of Systems: all systems reviewed with pertinent positives and negatives as listed above   Past Medical History:  Diagnosis Date   Depression    Diabetes mellitus without complication (HCC)    Hypertension    Spinal stenosis    With Neurogenic Claudication   Past Surgical History:  Procedure Laterality Date   CARPAL TUNNEL RELEASE Bilateral    INCISION AND DRAINAGE ABSCESS Left 03/12/2021   Procedure: INCISION AND DRAINAGE ABSCESS;  Surgeon: Josefina Chew, MD;  Location: [facility];  Service: Orthopedics;  Laterality: Left;   IR FLUORO GUIDED NEEDLE PLC ASPIRATION/INJECTION LOC   04/24/2022   IR FLUORO GUIDED NEEDLE PLC ASPIRATION/INJECTION LOC  12/05/2022   IR FLUORO GUIDED NEEDLE PLC ASPIRATION/INJECTION LOC  03/05/2023   IR FLUORO GUIDED NEEDLE PLC ASPIRATION/INJECTION LOC  03/05/2023   IR US  GUIDE BX ASP/DRAIN  02/02/2021   IRRIGATION AND DEBRIDEMENT ABSCESS Left 02/08/2021   Procedure: MINOR INCISION AND DRAINAGE OF ABSCESS;  Surgeon: Josefina Chew, MD;  Location: [facility];  Service: Orthopedics;  Laterality: Left;   IRRIGATION AND DEBRIDEMENT ABSCESS Left 02/10/2021   Procedure: IRRIGATION AND DEBRIDEMENT ABSCESS LEFT LATERAL CHEST WALL;  Surgeon: Tanda Locus, MD;  Location: [facility];  Service: General;  Laterality: Left;   IRRIGATION AND DEBRIDEMENT ABSCESS Bilateral 02/20/2021   Procedure: MINOR INCISION AND DRAINAGE OF ABSCESS/WITH BILATERAL WOUND VAC PLACEMENT;  Surgeon: Josefina Chew, MD;  Location: [facility];  Service: Orthopedics;  Laterality: Bilateral;   IRRIGATION AND DEBRIDEMENT SHOULDER Bilateral 02/28/2021   Procedure: IRRIGATION AND DEBRIDEMENT SHOULDER;  Surgeon:  Cristy Bonner DASEN, MD;  Location: [facility];  Service: Orthopedics;  Laterality: Bilateral;   IRRIGATION AND DEBRIDEMENT SHOULDER Left 03/06/2021   Procedure: IRRIGATION AND DEBRIDEMENT SHOULDER;  Surgeon: Beverley Evalene BIRCH, MD;  Location: [facility];  Service: Orthopedics;  Laterality: Left;   IRRIGATION AND DEBRIDEMENT SHOULDER Left 03/08/2021   Procedure: IRRIGATION AND DEBRIDEMENT SHOULDER;  Surgeon: Josefina Chew, MD;  Location: [facility];  Service: Orthopedics;  Laterality: Left;   LUMBAR LAMINECTOMY FOR EPIDURAL ABSCESS N/A 05/02/2022   Procedure: THORACIC  LAMINECTOMY AND DEBRIDEMENT  FOR EPIDURAL ABSCESS;  Surgeon: Colon Shove, MD;  Location: [facility];  Service: Neurosurgery;  Laterality: N/A;   LUMBAR LAMINECTOMY/DECOMPRESSION MICRODISCECTOMY N/A 12/13/2014   Procedure: LUMBAR THREE-FOUR, LUMBAR FOUR-FIVE, LUMBAR FIVE-SACRAL ONE LAMINECTOMY;  Surgeon: Shove Colon, MD;  Location: [facility];  Service:  Neurosurgery;  Laterality: N/A;  L3-4 L4-5 L5-S1 Laminectomy   LUMBAR LAMINECTOMY/DECOMPRESSION MICRODISCECTOMY Left 06/05/2021   Procedure: Left lumbar one-two, Lumbar two-three Laminectomy;  Surgeon: Colon Shove, MD;  Location: [facility] OR;  Service: Neurosurgery;  Laterality: Left;   MENISCUS REPAIR     Right knee   MINOR APPLICATION OF WOUND VAC Left 02/20/2021   Procedure: MINOR APPLICATION OF KERLIX PACKING/REMOVAL OF WOUND VAC;  Surgeon: Gladis Cough, MD;  Location[facility];  Service: General;  Laterality: Left;   RADIOLOGY WITH ANESTHESIA N/A 02/25/2023   Procedure: MRI WITH ANESTHESIA OF THORACIC AND LUMBAR WITH AND WITHOUT CONTRAST;  Surgeon: Radiologist, Medication, MD;  Location: [facility];  Service: Radiology;  Laterality: N/A;   RADIOLOGY WITH ANESTHESIA N/A 03/05/2023   Procedure: disc aspiration;  Surgeon: de Macedo Rodrigues, Katyucia, MD;  Location: [facility] OR;  Service: Radiology;  Laterality: N/A;   TEE WITHOUT CARDIOVERSION N/A 02/05/2021   Procedure: TRANSESOPHAGEAL ECHOCARDIOGRAM (TEE);  Surgeon: Shlomo Wilbert SAUNDERS, MD;  Location: [facility];  Service: Cardiovascular;  Laterality: N/A;   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   TOTAL SHOULDER ARTHROPLASTY Right 02/10/2021   Procedure: RIGHT OPEN SHOULDER I & D, OPEN DISTAL CLAVICLE RESECTION;  Surgeon: Josefina Chew, MD;  Location: [facility];  Service: Orthopedics;  Laterality: Right;   VASECTOMY      Social History   Tobacco Use   Smoking status: Former    Current packs/day: 0.00    Average packs/day: 2.0 packs/day for 15.0 years (30.0 ttl pk-yrs)    Types: Cigarettes    Start date: 49    Quit date: 2012    Years since quitting: 13.7    Passive exposure: Never   Smokeless tobacco: Never  Vaping Use   Vaping status: Never Used  Substance Use Topics   Alcohol use: Yes    Comment: occasional   Drug use: No    Family History  Problem Relation Age of Onset   Cancer Mother    Colon cancer Neg Hx     Allergies   Allergen Reactions   Tizanidine  Other (See Comments)    Leg cramps    Health Maintenance  Topic Date Due   Medicare Annual Wellness (AWV)  Never done   COVID-19 Vaccine (3 - Moderna risk series) 02/14/2020   Pneumococcal Vaccine: 50+ Years (2 of 2 - PCV) 06/23/2021   OPHTHALMOLOGY EXAM  11/17/2022   Influenza Vaccine  03/26/2024   Zoster Vaccines- Shingrix (1 of 2) 05/08/2024 (Originally 06[birth year]26/1986)   Lung Cancer Screening  02/05/2025 (Originally 12/03/2023)   Fecal DNA (Cologuard)  02/05/2025 (Originally 11/03/2023)   HEMOGLOBIN A1C  08/07/2024  DTaP/Tdap/Td (2 - Td or Tdap) 08/14/2024   Diabetic kidney evaluation - Urine ACR  09/10/2024   FOOT EXAM  09/10/2024   Diabetic kidney evaluation - eGFR measurement  02/25/2025   Hepatitis B Vaccines 19-59 Average Risk  Completed   Hepatitis C Screening  Completed   HIV Screening  Completed   HPV VACCINES  Aged Out   Meningococcal B Vaccine  Aged Out    Objective: BP (!) 134/53   Pulse 72   Temp 98.3 F (36.8 C) (Oral)   Wt 193 lb 3.2 oz (87.6 kg)   SpO2 95%   BMI 27.72 kg/m   Physical Exam Constitutional:  sitting in the wheelchair  HENT:     Head: Normocephalic and atraumatic.      Mouth: Mucous membranes are moist.  Eyes:    Conjunctiva/sclera: Conjunctivae normal.     Pupils:   Cardiovascular:     Rate and Rhythm: Normal rate and regular rhythm.     Heart sounds:  Pulmonary:     Effort: Pulmonary effort is normal.     Breath sounds:   Abdominal:     General: Non distended     Palpations:   Musculoskeletal:        General: sitting in a wheel chair and able to walk few steps with a cane  Skin:    General: Skin is warm and dry.   Neurological:     General:     Mental Status: awake, alert and oriented to person, place, and time.   Psychiatric:        Mood and Affect: Mood normal.   Lab Results Lab Results  Component Value Date   WBC 7.1 02/26/2024   HGB 13.8 02/26/2024   HCT 41.3 02/26/2024   MCV  87.1 02/26/2024   PLT 379 02/26/2024    Lab Results  Component Value Date   CREATININE 0.71 02/26/2024   BUN 9 02/26/2024   NA 139 02/26/2024   K 4.6 02/26/2024   CL 101 02/26/2024   CO2 27 02/26/2024    Lab Results  Component Value Date   ALT 12 02/26/2024   AST 13 02/26/2024   ALKPHOS 108 02/06/2024   BILITOT 0.5 02/26/2024    Lab Results  Component Value Date   CHOL 145 09/11/2023   HDL 59 09/11/2023   LDLCALC 68 09/11/2023   TRIG 100 09/11/2023   CHOLHDL 2.5 09/11/2023   No results found for: LABRPR, RPRTITER No results found for: HIV1RNAQUANT, HIV1RNAVL, CD4TABS   Microbiology  Results for orders placed or performed during the hospital encounter of 02/22/23  Culture, blood (Routine X 2) w Reflex to ID Panel     Status: None   Collection Time: 02/23/23  7:38 AM   Specimen: BLOOD  Result Value Ref Range Status   Specimen Description BLOOD SITE NOT SPECIFIED  Final   Special Requests   Final    BOTTLES DRAWN AEROBIC AND ANAEROBIC Blood Culture adequate volume   Culture   Final    NO GROWTH 5 DAYS Performed at [facility], [address] [place], [place] [address]    Report Status 02/28/2023 FINAL  Final  Culture, blood (Routine X 2) w Reflex to ID Panel     Status: None   Collection Time: 02/23/23  7:40 AM   Specimen: BLOOD  Result Value Ref Range Status   Specimen Description BLOOD SITE NOT SPECIFIED  Final   Special Requests   Final  BOTTLES DRAWN AEROBIC AND ANAEROBIC Blood Culture adequate volume   Culture   Final    NO GROWTH 5 DAYS Performed at [facility], [address] [place], [place] [address]    Report Status 02/28/2023 FINAL  Final  Aerobic/Anaerobic Culture w Gram Stain (surgical/deep wound)     Status: None   Collection Time: 03/05/23 10:04 AM   Specimen: Fine Needle Aspirate  Result Value Ref Range Status   Specimen Description NEEDLE ASPIRATE  Final   Special Requests NONE  Final   Gram Stain   Final    FEW  WBC PRESENT, PREDOMINANTLY PMN NO ORGANISMS SEEN    Culture   Final    No growth aerobically or anaerobically. Performed at [facility] Lab, [address], [place], [place] [address]    Report Status 03/10/2023 FINAL  Final  Aerobic/Anaerobic Culture w Gram Stain (surgical/deep wound)     Status: None   Collection Time: 03/05/23 10:07 AM   Specimen: Fine Needle Aspirate  Result Value Ref Range Status   Specimen Description NEEDLE ASPIRATE  Final   Special Requests  IDISC  Final   Gram Stain   Final    RARE WBC PRESENT, PREDOMINANTLY PMN NO ORGANISMS SEEN    Culture   Final    No growth aerobically or anaerobically. Performed at [facility] Lab, [address], [place], [place] [address]    Report Status 03/10/2023 FINAL  Final  Acid Fast Smear (AFB)     Status: None   Collection Time: 03/05/23 10:09 AM   Specimen: Intervertebral Disc; Fine Needle Aspirate  Result Value Ref Range Status   AFB Specimen Processing Concentration  Final   Acid Fast Smear Negative  Final    Comment: (NOTE) Performed At: [facility] [address] [place], [place] [id number] Jennette Shorter MD Ey:[id number]    Source (AFB) NEEDLE ASPIRATE  Final    Comment: Performed at [facility], [address], [place], [place] [address]  Acid Fast Culture with reflexed sensitivities     Status: None   Collection Time: 03/05/23 10:09 AM   Specimen: Intervertebral Disc; Fine Needle Aspirate  Result Value Ref Range Status   Acid Fast Culture Negative  Final    Comment: (NOTE) No acid fast bacilli isolated after 6 weeks. Performed At: [facility] [address] [place], [place] [id number] Jennette Shorter MD Ey:[id number]    Source of Sample NEEDLE ASPIRATE  Final    Comment: Performed at [facility] Lab, [address] [place], [place] [address]  Fungus Culture With Stain     Status: None   Collection Time: 03/05/23 10:09 AM   Specimen: Intervertebral Disc  Result Value  Ref Range Status   Fungus Stain Final report  Final   Fungus (Mycology) Culture Final report  Final    Comment: (NOTE) Performed At: [facility] [address] [place], [place] [id number] Jennette Shorter MD Ey:[id number]    Fungal Source NEEDLE ASPIRATE  Final    Comment: Performed at [facility] Lab, [address], [place], [place] [address]  Fungus Culture Result     Status: None   Collection Time: 03/05/23 10:09 AM  Result Value Ref Range Status   Result 1 Comment  Final    Comment: (NOTE) KOH/Calcofluor preparation:  no fungus observed. Performed At: [facility] [address] [place], [place] [id number] Jennette Shorter MD Ey:[id number]   Fungal organism reflex  Status: None   Collection Time: 03/05/23 10:09 AM  Result Value Ref Range Status   Fungal result 1 Comment  Final    Comment: (NOTE) No yeast or mold isolated after 4 weeks. Performed At: [facility] [address] [place], [place] [id number] Jennette Shorter MD Ey:[id number]   Fungus Culture With Stain     Status: None   Collection Time: 03/05/23 12:25 PM  Result Value Ref Range Status   Fungus Stain Final report  Final   Fungus (Mycology) Culture Final report  Final    Comment: (NOTE) Performed At: [facility] [address] [place], [place] [id number] Jennette Shorter MD Ey:[id number]    Fungal Source NEEDLE ASPIRATE  Final    Comment: Performed at [facility] Lab, [address] [place], [place] [address]  Acid Fast Smear (AFB)     Status: None   Collection Time: 03/05/23 12:25 PM   Specimen: Intervertebral Disc; Fine Needle Aspirate  Result Value Ref Range Status   AFB Specimen Processing Concentration  Final   Acid Fast Smear Negative  Final    Comment: (NOTE) Performed At: [facility] [address] [place], [place] [id number] Jennette Shorter MD Ey:[id number]    Source (AFB) NEEDLE ASPIRATE  Final    Comment: Performed at [facility] Lab,  [address], [place], [place] [address]  Acid Fast Culture with reflexed sensitivities     Status: None   Collection Time: 03/05/23 12:25 PM   Specimen: Intervertebral Disc; Fine Needle Aspirate  Result Value Ref Range Status   Acid Fast Culture Negative  Final    Comment: (NOTE) No acid fast bacilli isolated after 6 weeks. Performed At: [facility] [address] [place], [place] [id number] Jennette Shorter MD Ey:[id number]    Source of Sample NEEDLE ASPIRATE  Final    Comment: Performed at [facility] Lab, [address], [place], [place] [address]  Fungus Culture Result     Status: None   Collection Time: 03/05/23 12:25 PM  Result Value Ref Range Status   Result 1 Comment  Final    Comment: (NOTE) KOH/Calcofluor preparation:  no fungus observed. Performed At: [facility] [address] [place], [place] [id number] Jennette Shorter MD Ey:[id number]   Fungal organism reflex     Status: None   Collection Time: 03/05/23 12:25 PM  Result Value Ref Range Status   Fungal result 1 Comment  Final    Comment: (NOTE) No yeast or mold isolated after 4 weeks. Performed At: [facility] [address] [place], [place] [id number] Jennette Shorter MD Ey:[id number]    Imaging MRI L spine 02/25/23 IMPRESSION: 1. Findings consistent with discitis/osteomyelitis of the lower thoracic spine, progressed since prior thoracic MRI (September 2023). The T11-12 level, which was included on prior MRI of the lumbar spine (May 2024) appear stable. 2. Phlegmon surrounding the thecal sac results in moderate narrowing at T10-11 and severe at T11-12, resulting in mass effect on the cord without cord signal abnormality. 3. Interval resolution of ventral epidural fluid collection at the L1 level. 4. Degenerative changes of the thoracic spine with moderate spinal canal stenosis at T5-6. 5. Degenerative changes of the lumbar spine with moderate bilateral neural foraminal narrowing at  L1-2 and L2-3 and moderate left neural foraminal narrowing at L5-S1.   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  -continue ciprofloxacin  500mg  po  bid, plan to stop once repeat MRI stable. He is going to contact neurosurgery office for scheduling.  -Fu in 1 month  # Medication Monitoring  -Labs today -10/07/23 EKG  qtc 421  -Have previously discussed side effects of fluoroquinolones at length inlcuding arthropathy/arthralgia, CNS effects, C diff infection, glucose dysregulation, hepatotoxicity, aortic aneurysm. Discussed to fu with PCP if needs screening sooner than 65 years for Aortic Aneurysm since on prolonged ciprofloxacin    I spent 30 minutes involved in face-to-face and non-face-to-face activities for this patient on the day of the visit. Professional time spent includes the following activities: Preparing to see the patient (review of tests),  Performing a medically appropriate examination and evaluation, documenting clinical information in the EMR, Independently interpreting results (not separately reported), Communicating results to the patient/wife, Counseling and educating the patient/wife and Care coordination (not separately reported).   Annalee Joseph, MD [facility] 05/06/2024, 1:42 PM

## 2024-05-07 ENCOUNTER — Ambulatory Visit: Payer: Self-pay | Admitting: Infectious Diseases

## 2024-05-07 LAB — BASIC METABOLIC PANEL WITH GFR
BUN: 15 mg/dL (ref 7–25)
CO2: 28 mmol/L (ref 20–32)
Calcium: 9.6 mg/dL (ref 8.6–10.3)
Chloride: 102 mmol/L (ref 98–110)
Creat: 0.75 mg/dL (ref 0.70–1.30)
Glucose, Bld: 132 mg/dL — ABNORMAL HIGH (ref 65–99)
Potassium: 4.5 mmol/L (ref 3.5–5.3)
Sodium: 138 mmol/L (ref 135–146)
eGFR: 106 mL/min/1.73m2 (ref >=60)

## 2024-05-07 LAB — CBC
HCT: 38.7 % (ref 38.5–50.0)
Hemoglobin: 13.2 g/dL (ref 13.2–17.1)
MCH: 30 pg (ref 27.0–33.0)
MCHC: 34.1 g/dL (ref 32.0–36.0)
MCV: 88 fL (ref 80.0–100.0)
MPV: 9.8 fL (ref 7.5–12.5)
Platelets: 278 Thousand/uL (ref 140–400)
RBC: 4.4 Million/uL (ref 4.20–5.80)
RDW: 12.7 % (ref 11.0–15.0)
WBC: 7 Thousand/uL (ref 3.8–10.8)

## 2024-05-07 LAB — C-REACTIVE PROTEIN: CRP: 7.2 mg/L (ref <8.0)

## 2024-05-25 ENCOUNTER — Other Ambulatory Visit (HOSPITAL_COMMUNITY): Payer: Self-pay

## 2024-05-26 ENCOUNTER — Other Ambulatory Visit (HOSPITAL_COMMUNITY): Payer: Self-pay

## 2024-05-26 ENCOUNTER — Other Ambulatory Visit: Payer: Self-pay

## 2024-05-27 ENCOUNTER — Other Ambulatory Visit (HOSPITAL_COMMUNITY): Payer: Self-pay

## 2024-05-28 ENCOUNTER — Other Ambulatory Visit (HOSPITAL_COMMUNITY): Payer: Self-pay

## 2024-05-28 ENCOUNTER — Other Ambulatory Visit: Payer: Self-pay

## 2024-06-08 ENCOUNTER — Ambulatory Visit: Admitting: Infectious Diseases

## 2024-06-18 ENCOUNTER — Other Ambulatory Visit (HOSPITAL_COMMUNITY): Payer: Self-pay

## 2024-06-18 MED ORDER — METFORMIN HCL ER 500 MG PO TB24
1000.0000 mg | ORAL_TABLET | Freq: Every day | ORAL | 2 refills | Status: AC
  Filled 2024-06-18: qty 90, 45d supply, fill #0
  Filled 2024-07-31: qty 90, 45d supply, fill #1

## 2024-06-24 ENCOUNTER — Other Ambulatory Visit: Payer: Self-pay

## 2024-06-24 ENCOUNTER — Other Ambulatory Visit: Payer: Self-pay | Admitting: Infectious Diseases

## 2024-06-24 ENCOUNTER — Other Ambulatory Visit (HOSPITAL_COMMUNITY): Payer: Self-pay

## 2024-06-24 MED ORDER — BACLOFEN 5 MG PO TABS
5.0000 mg | ORAL_TABLET | Freq: Three times a day (TID) | ORAL | 2 refills | Status: DC
  Filled 2024-06-24: qty 90, 30d supply, fill #0
  Filled 2024-07-21: qty 90, 30d supply, fill #1
  Filled 2024-08-17: qty 90, 30d supply, fill #2

## 2024-06-24 MED ORDER — OXYCODONE-ACETAMINOPHEN 10-325 MG PO TABS
1.0000 | ORAL_TABLET | Freq: Every day | ORAL | 0 refills | Status: AC
  Filled 2024-06-24 – 2024-06-25 (×2): qty 150, 30d supply, fill #0

## 2024-06-24 MED ORDER — HYDROXYZINE HCL 10 MG PO TABS
10.0000 mg | ORAL_TABLET | Freq: Three times a day (TID) | ORAL | 2 refills | Status: DC
  Filled 2024-06-24: qty 90, 30d supply, fill #0
  Filled 2024-07-21: qty 90, 30d supply, fill #1
  Filled 2024-08-17: qty 90, 30d supply, fill #2

## 2024-06-24 MED ORDER — BUPRENORPHINE 10 MCG/HR TD PTWK
1.0000 | MEDICATED_PATCH | TRANSDERMAL | 0 refills | Status: DC
  Filled 2024-06-24: qty 4, 28d supply, fill #0

## 2024-06-24 MED ORDER — CIPROFLOXACIN HCL 500 MG PO TABS
500.0000 mg | ORAL_TABLET | Freq: Two times a day (BID) | ORAL | 1 refills | Status: DC
  Filled 2024-06-24: qty 60, 30d supply, fill #0
  Filled 2024-07-21: qty 60, 30d supply, fill #1

## 2024-06-25 ENCOUNTER — Other Ambulatory Visit (HOSPITAL_COMMUNITY): Payer: Self-pay

## 2024-06-26 ENCOUNTER — Other Ambulatory Visit (HOSPITAL_COMMUNITY): Payer: Self-pay

## 2024-07-06 ENCOUNTER — Other Ambulatory Visit: Payer: Self-pay | Admitting: Family

## 2024-07-07 ENCOUNTER — Encounter: Payer: Self-pay | Admitting: *Deleted

## 2024-07-13 ENCOUNTER — Other Ambulatory Visit: Payer: Self-pay

## 2024-07-16 ENCOUNTER — Encounter: Payer: Self-pay | Admitting: Infectious Diseases

## 2024-07-21 ENCOUNTER — Other Ambulatory Visit (HOSPITAL_COMMUNITY): Payer: Self-pay

## 2024-07-21 MED ORDER — OXYCODONE-ACETAMINOPHEN 10-325 MG PO TABS
1.0000 | ORAL_TABLET | Freq: Every day | ORAL | 0 refills | Status: AC | PRN
  Filled 2024-07-23: qty 150, 30d supply, fill #0

## 2024-07-21 MED ORDER — OXYCODONE-ACETAMINOPHEN 10-325 MG PO TABS
1.0000 | ORAL_TABLET | Freq: Every day | ORAL | 0 refills | Status: AC | PRN
  Filled 2024-08-20: qty 150, 30d supply, fill #0
  Filled 2024-08-20: qty 150, 32d supply, fill #0

## 2024-07-21 MED ORDER — BUPRENORPHINE 10 MCG/HR TD PTWK
1.0000 | MEDICATED_PATCH | TRANSDERMAL | 1 refills | Status: DC
  Filled 2024-07-21: qty 4, 28d supply, fill #0
  Filled 2024-08-17 – 2024-08-18 (×2): qty 4, 28d supply, fill #1

## 2024-07-23 ENCOUNTER — Other Ambulatory Visit (HOSPITAL_COMMUNITY): Payer: Self-pay

## 2024-07-23 ENCOUNTER — Other Ambulatory Visit: Payer: Self-pay

## 2024-07-23 ENCOUNTER — Other Ambulatory Visit: Payer: Self-pay | Admitting: Family

## 2024-07-23 DIAGNOSIS — N522 Drug-induced erectile dysfunction: Secondary | ICD-10-CM

## 2024-08-09 ENCOUNTER — Encounter: Payer: Self-pay | Admitting: Family

## 2024-08-09 ENCOUNTER — Ambulatory Visit: Payer: Self-pay | Admitting: Family

## 2024-08-09 VITALS — BP 125/81 | HR 66 | Temp 97.9°F | Ht 70.0 in

## 2024-08-09 DIAGNOSIS — M51372 Other intervertebral disc degeneration, lumbosacral region with discogenic back pain and lower extremity pain: Secondary | ICD-10-CM | POA: Diagnosis not present

## 2024-08-09 DIAGNOSIS — F419 Anxiety disorder, unspecified: Secondary | ICD-10-CM

## 2024-08-09 DIAGNOSIS — F339 Major depressive disorder, recurrent, unspecified: Secondary | ICD-10-CM | POA: Diagnosis not present

## 2024-08-09 DIAGNOSIS — E1142 Type 2 diabetes mellitus with diabetic polyneuropathy: Secondary | ICD-10-CM | POA: Diagnosis not present

## 2024-08-09 DIAGNOSIS — F1120 Opioid dependence, uncomplicated: Secondary | ICD-10-CM | POA: Diagnosis not present

## 2024-08-09 DIAGNOSIS — E785 Hyperlipidemia, unspecified: Secondary | ICD-10-CM | POA: Diagnosis not present

## 2024-08-09 DIAGNOSIS — E1169 Type 2 diabetes mellitus with other specified complication: Secondary | ICD-10-CM

## 2024-08-09 DIAGNOSIS — R195 Other fecal abnormalities: Secondary | ICD-10-CM | POA: Diagnosis not present

## 2024-08-09 DIAGNOSIS — I110 Hypertensive heart disease with heart failure: Secondary | ICD-10-CM | POA: Diagnosis not present

## 2024-08-09 DIAGNOSIS — I10 Essential (primary) hypertension: Secondary | ICD-10-CM | POA: Diagnosis not present

## 2024-08-09 DIAGNOSIS — R5381 Other malaise: Secondary | ICD-10-CM

## 2024-08-09 LAB — BAYER DCA HB A1C WAIVED: HB A1C (BAYER DCA - WAIVED): 6.3 % — ABNORMAL HIGH (ref 4.8–5.6)

## 2024-08-09 NOTE — Progress Notes (Signed)
 Subjective:    Patient ID: John Ware, male    DOB: November 26, 1965, 58 y.o.   MRN: [id number]  Chief Complaint  Patient presents with   Medical Management of Chronic Issues   PT presents to the office today for chronic follow up.   He is followed by Pain Clinic every month for chronic back pain. Has hx of lumbar stenosis.    He is followed by ID for psoas muscle abscess. He is currently taking cefadroxil  daily.  He is followed by Ortho as needed.   He is complaining of ED and requesting medication today.  Hypertension This is a chronic problem. The current episode started more than 1 year ago. The problem has been resolved since onset. The problem is controlled. Associated symptoms include anxiety and malaise/fatigue. Pertinent negatives include no blurred vision, peripheral edema or shortness of breath. Risk factors for coronary artery disease include dyslipidemia, male gender and sedentary lifestyle. The current treatment provides moderate improvement. Hypertensive end-organ damage includes heart failure.  Congestive Heart Failure Presents for follow-up visit. Associated symptoms include fatigue. Pertinent negatives include no edema or shortness of breath. The symptoms have been stable.  Hyperlipidemia This is a chronic problem. The current episode started more than 1 year ago. The problem is controlled. Recent lipid tests were reviewed and are normal. Pertinent negatives include no shortness of breath. Current antihyperlipidemic treatment includes diet change and statins. The current treatment provides moderate improvement of lipids. Risk factors for coronary artery disease include dyslipidemia, hypertension, male sex and a sedentary lifestyle.  Diabetes He presents for his follow-up diabetic visit. He has type 2 diabetes mellitus. Hypoglycemia symptoms include nervousness/anxiousness. Associated symptoms include fatigue and foot paresthesias. Pertinent negatives for diabetes include no  blurred vision. Symptoms are stable. Diabetic complications include peripheral neuropathy. Risk factors for coronary artery disease include dyslipidemia, diabetes mellitus, hypertension, male sex and sedentary lifestyle. He is following a generally healthy diet. His overall blood glucose range is 130-140 mg/dl. Eye exam is current.  Anemia Presents for follow-up visit. Symptoms include malaise/fatigue. Past medical history includes heart failure.  Back Pain This is a chronic problem. The current episode started more than 1 year ago. The problem occurs intermittently. The problem has been waxing and waning since onset. The pain is present in the lumbar spine. The quality of the pain is described as aching. The pain is at a severity of 3/10. The pain is mild. He has tried analgesics for the symptoms. The treatment provided moderate relief.  Depression        This is a chronic problem.  The current episode started more than 1 year ago.   The onset quality is gradual.   The problem occurs intermittently.  Associated symptoms include fatigue and decreased interest.  Associated symptoms include no helplessness, no hopelessness and not sad.  Past medical history includes anxiety.   Anxiety Presents for follow-up visit. Symptoms include excessive worry, irritability and nervous/anxious behavior. Patient reports no shortness of breath. Symptoms occur occasionally. The severity of symptoms is mild.        Review of Systems  Constitutional:  Positive for fatigue, irritability and malaise/fatigue.  Eyes:  Negative for blurred vision.  Respiratory:  Negative for shortness of breath.   Musculoskeletal:  Positive for back pain.  Psychiatric/Behavioral:  The patient is nervous/anxious.   All other systems reviewed and are negative.      Objective:   Physical Exam Vitals reviewed.  Constitutional:      General:  He is not in acute distress.    Appearance: He is underweight.     Comments: Body appearance   HENT:     Head: Normocephalic.     Right Ear: Tympanic membrane normal.     Left Ear: Tympanic membrane normal.  Eyes:     General:        Right eye: No discharge.        Left eye: No discharge.     Pupils: Pupils are equal, round, and reactive to light.  Neck:     Thyroid : No thyromegaly.  Cardiovascular:     Rate and Rhythm: Normal rate and regular rhythm.     Heart sounds: Normal heart sounds. No murmur heard. Pulmonary:     Effort: Pulmonary effort is normal. No respiratory distress.     Breath sounds: Normal breath sounds. No wheezing.  Abdominal:     General: Bowel sounds are normal. There is no distension.     Palpations: Abdomen is soft.     Tenderness: There is no abdominal tenderness.  Musculoskeletal:        General: No tenderness. Normal range of motion.     Cervical back: Normal range of motion and neck supple.  Skin:    General: Skin is warm and dry.     Coloration: Skin is pale.     Findings: No erythema or rash.  Neurological:     Mental Status: He is alert and oriented to person, place, and time.     Cranial Nerves: No cranial nerve deficit.     Motor: Weakness (in wheelchair) present.     Gait: Gait abnormal.     Deep Tendon Reflexes: Reflexes are normal and symmetric.  Psychiatric:        Behavior: Behavior normal.        Thought Content: Thought content normal.        Judgment: Judgment normal.       BP 125/81   Pulse 66   Temp 97.9 F (36.6 C) (Temporal)   Ht 5' 10 (1.778 m)   SpO2 95%   BMI 27.72 kg/m      Assessment & Plan:  [name] comes in today with chief complaint of Medical Management of Chronic Issues   Diagnosis and orders addressed: 1. Opioid dependence, uncomplicated (HCC) - CMP14+EGFR - CBC with Differential/Platelet  2. Hypertensive heart disease with heart failure (HCC) - CMP14+EGFR - CBC with Differential/Platelet  3. Hyperlipidemia associated with type 2 diabetes mellitus (HCC) - CMP14+EGFR - CBC with  Differential/Platelet  4. Anxiety - CMP14+EGFR - CBC with Differential/Platelet  5. Type 2 diabetes mellitus with other specified complication, without long-term current use of insulin  (HCC) (Primary) - CMP14+EGFR - CBC with Differential/Platelet - Bayer DCA Hb A1c Waived  6. Diabetic peripheral neuropathy (HCC) - CMP14+EGFR - CBC with Differential/Platelet  7. Depression, recurrent - CMP14+EGFR - CBC with Differential/Platelet  8. Degeneration of intervertebral disc of lumbosacral region with discogenic back pain and lower extremity pain  - CMP14+EGFR - CBC with Differential/Platelet  9. Primary hypertension - CMP14+EGFR - CBC with Differential/Platelet  10. Physical deconditioning - CMP14+EGFR - CBC with Differential/Platelet  11. Positive colorectal cancer screening using Cologuard test - Ambulatory referral to Gastroenterology - CMP14+EGFR - CBC with Differential/Platelet   Labs pending Encouraged strengthening exercises  Continue current medications  Keep follow up with specialists  Health Maintenance reviewed Diet and exercise encouraged Referral to GI pending   Follow up plan: 4 months  Bari Learn, FNP

## 2024-08-09 NOTE — Patient Instructions (Signed)
 Health Maintenance, Male  Adopting a healthy lifestyle and getting preventive care are important in promoting health and wellness. Ask your health care provider about:  The right schedule for you to have regular tests and exams.  Things you can do on your own to prevent diseases and keep yourself healthy.  What should I know about diet, weight, and exercise?  Eat a healthy diet    Eat a diet that includes plenty of vegetables, fruits, low-fat dairy products, and lean protein.  Do not eat a lot of foods that are high in solid fats, added sugars, or sodium.  Maintain a healthy weight  Body mass index (BMI) is a measurement that can be used to identify possible weight problems. It estimates body fat based on height and weight. Your health care provider can help determine your BMI and help you achieve or maintain a healthy weight.  Get regular exercise  Get regular exercise. This is one of the most important things you can do for your health. Most adults should:  Exercise for at least 150 minutes each week. The exercise should increase your heart rate and make you sweat (moderate-intensity exercise).  Do strengthening exercises at least twice a week. This is in addition to the moderate-intensity exercise.  Spend less time sitting. Even light physical activity can be beneficial.  Watch cholesterol and blood lipids  Have your blood tested for lipids and cholesterol at 57 years of age, then have this test every 5 years.  You may need to have your cholesterol levels checked more often if:  Your lipid or cholesterol levels are high.  You are older than 57 years of age.  You are at high risk for heart disease.  What should I know about cancer screening?  Many types of cancers can be detected early and may often be prevented. Depending on your health history and family history, you may need to have cancer screening at various ages. This may include screening for:  Colorectal cancer.  Prostate cancer.  Skin cancer.  Lung  cancer.  What should I know about heart disease, diabetes, and high blood pressure?  Blood pressure and heart disease  High blood pressure causes heart disease and increases the risk of stroke. This is more likely to develop in people who have high blood pressure readings or are overweight.  Talk with your health care provider about your target blood pressure readings.  Have your blood pressure checked:  Every 3-5 years if you are 24-52 years of age.  Every year if you are 3 years old or older.  If you are between the ages of 60 and 72 and are a current or former smoker, ask your health care provider if you should have a one-time screening for abdominal aortic aneurysm (AAA).  Diabetes  Have regular diabetes screenings. This checks your fasting blood sugar level. Have the screening done:  Once every three years after age 66 if you are at a normal weight and have a low risk for diabetes.  More often and at a younger age if you are overweight or have a high risk for diabetes.  What should I know about preventing infection?  Hepatitis B  If you have a higher risk for hepatitis B, you should be screened for this virus. Talk with your health care provider to find out if you are at risk for hepatitis B infection.  Hepatitis C  Blood testing is recommended for:  Everyone born from [birth year] through 1964.  Anyone  with known risk factors for hepatitis C.  Sexually transmitted infections (STIs)  You should be screened each year for STIs, including gonorrhea and chlamydia, if:  You are sexually active and are younger than 57 years of age.  You are older than 57 years of age and your health care provider tells you that you are at risk for this type of infection.  Your sexual activity has changed since you were last screened, and you are at increased risk for chlamydia or gonorrhea. Ask your health care provider if you are at risk.  Ask your health care provider about whether you are at high risk for HIV. Your health care provider  may recommend a prescription medicine to help prevent HIV infection. If you choose to take medicine to prevent HIV, you should first get tested for HIV. You should then be tested every 3 months for as long as you are taking the medicine.  Follow these instructions at home:  Alcohol use  Do not drink alcohol if your health care provider tells you not to drink.  If you drink alcohol:  Limit how much you have to 0-2 drinks a day.  Know how much alcohol is in your drink. In the [place] one drink equals one 12 oz bottle of beer (355 mL), one 5 oz glass of wine (148 mL), or one 1 oz glass of hard liquor (44 mL).  Lifestyle  Do not use any products that contain nicotine or tobacco. These products include cigarettes, chewing tobacco, and vaping devices, such as e-cigarettes. If you need help quitting, ask your health care provider.  Do not use street drugs.  Do not share needles.  Ask your health care provider for help if you need support or information about quitting drugs.  General instructions  Schedule regular health, dental, and eye exams.  Stay current with your vaccines.  Tell your health care provider if:  You often feel depressed.  You have ever been abused or do not feel safe at home.  Summary  Adopting a healthy lifestyle and getting preventive care are important in promoting health and wellness.  Follow your health care provider's instructions about healthy diet, exercising, and getting tested or screened for diseases.  Follow your health care provider's instructions on monitoring your cholesterol and blood pressure.  This information is not intended to replace advice given to you by your health care provider. Make sure you discuss any questions you have with your health care provider.  Document Revised: 01/01/2021 Document Reviewed: 01/01/2021  Elsevier Patient Education  2024 [employer].

## 2024-08-10 ENCOUNTER — Ambulatory Visit: Payer: Self-pay | Admitting: Family

## 2024-08-10 LAB — CBC WITH DIFFERENTIAL/PLATELET
Basophils Absolute: 0 x10E3/uL (ref 0.0–0.2)
Basos: 0 %
EOS (ABSOLUTE): 0.2 x10E3/uL (ref 0.0–0.4)
Eos: 3 %
Hematocrit: 39 % (ref 37.5–51.0)
Hemoglobin: 13 g/dL (ref 13.0–17.7)
Immature Grans (Abs): 0 x10E3/uL (ref 0.0–0.1)
Immature Granulocytes: 0 %
Lymphocytes Absolute: 1.7 x10E3/uL (ref 0.7–3.1)
Lymphs: 22 %
MCH: 29.8 pg (ref 26.6–33.0)
MCHC: 33.3 g/dL (ref 31.5–35.7)
MCV: 89 fL (ref 79–97)
Monocytes Absolute: 0.6 x10E3/uL (ref 0.1–0.9)
Monocytes: 8 %
Neutrophils Absolute: 5.2 x10E3/uL (ref 1.4–7.0)
Neutrophils: 67 %
Platelets: 320 x10E3/uL (ref 150–450)
RBC: 4.36 x10E6/uL (ref 4.14–5.80)
RDW: 13.2 % (ref 11.6–15.4)
WBC: 7.7 x10E3/uL (ref 3.4–10.8)

## 2024-08-10 LAB — CMP14+EGFR
ALT: 16 IU/L (ref 0–44)
AST: 19 IU/L (ref 0–40)
Albumin: 4.1 g/dL (ref 3.8–4.9)
Alkaline Phosphatase: 95 IU/L (ref 47–123)
BUN/Creatinine Ratio: 10 (ref 9–20)
BUN: 8 mg/dL (ref 6–24)
Bilirubin Total: 0.4 mg/dL (ref 0.0–1.2)
CO2: 23 mmol/L (ref 20–29)
Calcium: 9.2 mg/dL (ref 8.7–10.2)
Chloride: 99 mmol/L (ref 96–106)
Creatinine, Ser: 0.77 mg/dL (ref 0.76–1.27)
Globulin, Total: 2.3 g/dL (ref 1.5–4.5)
Glucose: 137 mg/dL — ABNORMAL HIGH (ref 70–99)
Potassium: 5.4 mmol/L — ABNORMAL HIGH (ref 3.5–5.2)
Sodium: 137 mmol/L (ref 134–144)
Total Protein: 6.4 g/dL (ref 6.0–8.5)
eGFR: 104 mL/min/1.73 (ref >59)

## 2024-08-16 ENCOUNTER — Ambulatory Visit: Admitting: Infectious Diseases

## 2024-08-16 ENCOUNTER — Other Ambulatory Visit: Payer: Self-pay

## 2024-08-16 ENCOUNTER — Encounter: Payer: Self-pay | Admitting: Infectious Diseases

## 2024-08-16 VITALS — BP 188/101 | HR 71 | Temp 97.9°F

## 2024-08-16 DIAGNOSIS — M4644 Discitis, unspecified, thoracic region: Secondary | ICD-10-CM

## 2024-08-16 DIAGNOSIS — I10 Essential (primary) hypertension: Secondary | ICD-10-CM | POA: Diagnosis not present

## 2024-08-16 DIAGNOSIS — M4624 Osteomyelitis of vertebra, thoracic region: Secondary | ICD-10-CM | POA: Diagnosis not present

## 2024-08-16 DIAGNOSIS — Z5181 Encounter for therapeutic drug level monitoring: Secondary | ICD-10-CM | POA: Diagnosis not present

## 2024-08-16 NOTE — Progress Notes (Unsigned)
 "  Patient Active Problem List   Diagnosis Date Noted   Opioid dependence, uncomplicated (HCC) 08/09/2024   Physical deconditioning 04/07/2023   Hypokalemia 02/22/2023   Chronic back pain 01/30/2023   Intractable back pain 12/04/2022   Vertebral osteomyelitis (HCC) 12/04/2022   Pseudomonas aeruginosa infection 07/07/2022   Osteomyelitis of thoracic region [facility]) 07/03/2022   Osteomyelitis of lumbar spine (HCC) 05/08/2022   Numbness    Chronic diastolic CHF (congestive heart failure) (HCC) 04/23/2022   Chronic pain 04/23/2022   Osteomyelitis of vertebra of thoracolumbar region (HCC) 04/22/2022   Presence of retained hardware 10/17/2021   Infective myositis 08/29/2021   Discitis of thoracolumbar region 07/31/2021   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   MRSA infection    Diabetic peripheral neuropathy (HCC)    Septic discitis of lumbar region 06/11/2021   Infection of spine (HCC) 05/28/2021   Medication monitoring encounter 05/09/2021   Psoas abscess (HCC)    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   Right shoulder pain 02/14/2021   Normocytic anemia 02/14/2021   Thrombocytosis 02/14/2021   Endocarditis 02/05/2021   Vision changes 02/04/2021   Ascending aorta dilatation 02/03/2021   Pressure injury of skin 02/01/2021   HTN (hypertension) 02/01/2021   Anxiety 02/01/2021   Left shoulder pain 02/01/2021   Hyponatremia 01/31/2021   DOE (dyspnea on exertion) 10/20/2020   Diabetic polyneuropathy associated with type 2 diabetes mellitus (HCC) 10/09/2020   DM2 (diabetes mellitus, type 2) (HCC) 10/30/2018   Lumbar stenosis with neurogenic claudication 10/14/2017   DDD (degenerative disc disease), lumbar 02/17/2017   Transient weakness of right lower extremity 02/05/2017   Hyperlipidemia associated with type 2 diabetes mellitus (HCC) 10/24/2016   Depression, recurrent 10/24/2016   Right knee pain 05/21/2016   DDD (degenerative disc disease),  lumbosacral 05/21/2016   Herniation of nucleus pulposus of cervical intervertebral disc without myelopathy 02/29/2016   Carpal tunnel syndrome 02/08/2016   Cervical spondylosis with myelopathy 01/17/2016   Lumbar stenosis 12/13/2014   Current Outpatient Medications on File Prior to Visit  Medication Sig Dispense Refill   Baclofen  5 MG TABS Take 1 tablet (5 mg total) by mouth 3 (three) times daily. 90 tablet 2   buprenorphine  (BUTRANS ) 10 MCG/HR PTWK Place 1 patch onto the skin once a week. 4 patch 1   buprenorphine  (BUTRANS ) 7.5 MCG/HR Place 1 patch onto the skin every 7 (seven) days. 4 patch 1   cefadroxil  (DURICEF) 500 MG capsule Take 1 capsule (500 mg total) by mouth 2 (two) times daily. 60 capsule 11   Continuous Glucose Sensor (FREESTYLE LIBRE 3 PLUS SENSOR) MISC Change sensor every 15 days. 6 each 2   gabapentin  (NEURONTIN ) 300 MG capsule TAKE 1 CAPSULE 2 TIMES A DAY 60 capsule 2   hydrOXYzine  (ATARAX ) 10 MG tablet Take 1 tablet (10 mg total) by mouth 3 (three) times daily. 90 tablet 2   metFORMIN  (GLUCOPHAGE -XR) 500 MG 24 hr tablet Take 2 tablets (1,000 mg total) by mouth daily with breakfast. 90 tablet 2   naloxone  (NARCAN ) 0.4 MG/ML injection Inject 1 mL (0.4 mg total) into the vein as needed. 1 mL 0   naloxone  (NARCAN ) nasal spray 4 mg/0.1 mL Use 1 spray in one nostril for single dose as needed. May repeat every 2-3 minutes until patient responsive or EMS arrives. 1 each 1   oxyCODONE -acetaminophen  (PERCOCET) 10-325 MG tablet Take 1 tablet by mouth 5 times daily as needed. 95 tablet 0   oxyCODONE -acetaminophen  (PERCOCET) 10-325 MG tablet Take 1 tablet by  mouth five times a day 150 tablet 0   oxyCODONE -acetaminophen  (PERCOCET) 10-325 MG tablet Take 1 tablet by mouth 5 (five) times daily. 15 tablet 0   oxyCODONE -acetaminophen  (PERCOCET) 10-325 MG tablet Take 1 tablet by mouth 5 (five) times daily. 150 tablet 0   oxyCODONE -acetaminophen  (PERCOCET) 10-325 MG tablet Take  1 tablet by mouth 5 (five) times daily as needed for pain 150 tablet 0   oxyCODONE -acetaminophen  (PERCOCET) 10-325 MG tablet Take 1 tablet by mouth 5 (five) times daily as needed for pain 150 tablet 0   oxyCODONE -acetaminophen  (PERCOCET) 10-325 MG tablet Take 1 tablet by mouth 5 (five) times daily as needed for pain. 150 tablet 0   [START ON 08/18/2024] oxyCODONE -acetaminophen  (PERCOCET) 10-325 MG tablet Take 1 tablet by mouth 5 (five) times daily as needed for pain 150 tablet 0   oxyCODONE -acetaminophen  (PERCOCET) 10-325 MG tablet Take 1 tablet by mouth 5 (five) times daily as needed for pain 150 tablet 0   sildenafil  (VIAGRA ) 100 MG tablet TAKE 1/2 TO 1 (ONE-HALF TO ONE) TABLET BY MOUTH ONCE DAILY AS NEEDED FOR ERECTILE DYSFUNCTION 30 tablet 2   No current facility-administered medications on file prior to visit.   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   Admitted on 04/22/2022 with inability to walk, BLE numbness, intermittent fevers and urinary incontinence x3 weeks.  He was found to have acute discitis T9/10-T11/T12 with epidural phlegmon/abscess, septic arthritis T11-T12, extensive enhancement paraspinal soft tissue, as well as soft tissue abscess. 8/30 S/p fluoroscopy guided T1-11 disc aspiration ( Cx PsA), 8/31 aspiration of left back superficial  abscess ( Cx PsA). Blood cx 8/28 1/2 sets staph pseudintermedius/epidermidis. This was followed by I&D of subcut abscess with decompression and debridement of osteomyelitis via T10-T11 laminectomy on 09/07 by Dr. Colon ( OR cx NG). Seen by ID and antibiotics switched to cefepime  and planned for 6 weeks course EOT 10/19 with plan to continue Ciprofloxacin  thereafter. Patient was also continued on doxycycline  for hardware associated lumbar osteomyelitis. Patient was discharged to CIR due to functional decline on 9/12 and then discharged home from rehab on 9/22.   Res-started on ciprofloxacin  750mg  po bid on 11/8 due to concerns of persistent infection in the setting of elevated inflammatory markers and continued to take PO doxycycline  as is without any concerns.    Admitted 4/9-4/15/24 with intractable back pain. 4/9 MRI L spine with discitis, osteomyelitis in lower T and Lumbar spine, epidural phlegmon. 5.9 x 2.0 x 2.0 cm fluid collection spanning the L4-L5 interspinous space, at the site of prior laminectomy, which could reflect a postoperative seroma, although abscess is not excluded.This is unchanged compared to 04/22/22. S/p IR aspiration 4/11,  cx grew MSSE. Discharged on cefazolin  for 6 weeks. EOT 6/6 and transitioned to cefadroxil .   Admitted 6/29-7/11/24 for intractable back pain to get MRI.  MRI had to be performed with anesthesia on 7/2.  MRI findings with discitis/osteomyelitis of the lower T-spine progressed, phlegmon surrounding the thecal  sac resulting in moderate narrowing of T10-T11 and severe at T11-T12 resulting in mass effect of the cord without cord signal abnormality, interval resolution of the ventral epidural fluid collection at the L1 level. Per neurosurgery surgical intervention would be high risk and recommended conservative management.  S/p CT-guided aspiration on 7/10 under anesthesia, cultures with no growth.  Seen by ID and was discharged on IV cefepime  for a total of 8 weeks, EOT  04/23/2023. Cefepime  switched to PO ciprofloxacin  and cefadroxil  after completion of IV course.  Taking ciprofloxacin  750 mg po bid and cefadroxil  500mg  po bid  without missing doses or concerns. He is still unable to walk independently.  08/05/23 Seen by Dr Colon 11/13 and MRI T spine has been ordered  12/19/23 Accompanied by wife. Back pain is stable. He has not followed up with [facility] yet and has not been able to do MRI yet as unable to find open MRI. Told to discuss with Neurosurgery about this and they will call his office today. He has been compliant with both cefadroxil  and ciprofloxacin  except missed 2 doses during Easter. He is able to walk at home with walker and uses wheelchair outside home. He is able to stand briefly without support but difficult to walk. He says numbness in his left lower extremity has improved, only limited to left foot ( previously up to thigh). No wounds in bilateral feet. No new concerns.   7/3 Accompanied by wife. Taking cefadroxil  and ciprofloxacin  as instructed. Has not been able to get MRI yet, have not seen Nsx yet. He is now able to walk few steps with cane and pleased with the progress. Seen by PCP on 6/13.  No new concerns.   9/5 Accompanied by wife. Compliant with cefadroxil  and ciprofloxacin . Back pain stable, here and there. He is able to walk with support. MRI is still pending and he is going to contact with [facility] for scheduling. No other concerns.   12/22 Not as walking good as last time, appt with [facility] in Jan 15, cannot afford PT. Uses wheel chair mostly,walks with support for short distances at home and depends on task. Has stopped taking cipro  after MRI and cefadroxil . 12/15 fu in 3 months   Review of Systems: all systems reviewed with pertinent positives and negatives as listed above   Past Medical History:  Diagnosis Date   Depression    Diabetes mellitus without complication (HCC)    Hypertension    Spinal stenosis    With Neurogenic  Claudication   Past Surgical History:  Procedure Laterality Date   CARPAL TUNNEL RELEASE Bilateral    INCISION AND DRAINAGE ABSCESS Left 03/12/2021   Procedure: INCISION AND DRAINAGE ABSCESS;  Surgeon: Josefina Chew, MD;  Location: [facility];  Service: Orthopedics;  Laterality: Left;   IR FLUORO GUIDED NEEDLE PLC ASPIRATION/INJECTION LOC  04/24/2022   IR FLUORO GUIDED NEEDLE PLC ASPIRATION/INJECTION LOC  12/05/2022   IR FLUORO GUIDED NEEDLE PLC ASPIRATION/INJECTION LOC  03/05/2023   IR FLUORO GUIDED NEEDLE PLC ASPIRATION/INJECTION LOC  03/05/2023   IR US  GUIDE BX ASP/DRAIN  02/02/2021   IRRIGATION AND DEBRIDEMENT ABSCESS Left 02/08/2021   Procedure: MINOR INCISION AND DRAINAGE OF ABSCESS;  Surgeon: Josefina Chew, MD;  Location: [facility];  Service: Orthopedics;  Laterality: Left;   IRRIGATION AND DEBRIDEMENT ABSCESS Left 02/10/2021   Procedure: IRRIGATION AND DEBRIDEMENT ABSCESS LEFT LATERAL CHEST WALL;  Surgeon: Tanda Locus, MD;  Location: [facility];  Service: General;  Laterality: Left;   IRRIGATION AND  DEBRIDEMENT ABSCESS Bilateral 02/20/2021   Procedure: MINOR INCISION AND DRAINAGE OF ABSCESS/WITH BILATERAL WOUND VAC PLACEMENT;  Surgeon: Josefina Chew, MD;  Location: [facility];  Service: Orthopedics;  Laterality: Bilateral;   IRRIGATION AND DEBRIDEMENT SHOULDER Bilateral 02/28/2021   Procedure: IRRIGATION AND DEBRIDEMENT SHOULDER;  Surgeon: Cristy Bonner DASEN, MD;  Location: [facility];  Service: Orthopedics;  Laterality: Bilateral;   IRRIGATION AND DEBRIDEMENT SHOULDER Left 03/06/2021   Procedure: IRRIGATION AND DEBRIDEMENT SHOULDER;  Surgeon: Beverley Evalene BIRCH, MD;  Location: [facility];  Service: Orthopedics;  Laterality: Left;   IRRIGATION AND DEBRIDEMENT SHOULDER Left 03/08/2021   Procedure: IRRIGATION AND DEBRIDEMENT SHOULDER;  Surgeon: Josefina Chew, MD;  Location: [facility];  Service: Orthopedics;  Laterality: Left;   LUMBAR LAMINECTOMY FOR EPIDURAL ABSCESS N/A 05/02/2022   Procedure: THORACIC   LAMINECTOMY AND DEBRIDEMENT  FOR EPIDURAL ABSCESS;  Surgeon: Colon Shove, MD;  Location: [facility];  Service: Neurosurgery;  Laterality: N/A;   LUMBAR LAMINECTOMY/DECOMPRESSION MICRODISCECTOMY N/A 12/13/2014   Procedure: LUMBAR THREE-FOUR, LUMBAR FOUR-FIVE, LUMBAR FIVE-SACRAL ONE LAMINECTOMY;  Surgeon: Shove Colon, MD;  Location: [facility];  Service: Neurosurgery;  Laterality: N/A;  L3-4 L4-5 L5-S1 Laminectomy   LUMBAR LAMINECTOMY/DECOMPRESSION MICRODISCECTOMY Left 06/05/2021   Procedure: Left lumbar one-two, Lumbar two-three Laminectomy;  Surgeon: Colon Shove, MD;  Location: [facility] OR;  Service: Neurosurgery;  Laterality: Left;   MENISCUS REPAIR     Right knee   MINOR APPLICATION OF WOUND VAC Left 02/20/2021   Procedure: MINOR APPLICATION OF KERLIX PACKING/REMOVAL OF WOUND VAC;  Surgeon: Gladis Cough, MD;  Location: [facility];  Service: General;  Laterality: Left;   RADIOLOGY WITH ANESTHESIA N/A 02/25/2023   Procedure: MRI WITH ANESTHESIA OF THORACIC AND LUMBAR WITH AND WITHOUT CONTRAST;  Surgeon: Radiologist, Medication, MD;  Location: [facility];  Service: Radiology;  Laterality: N/A;   RADIOLOGY WITH ANESTHESIA N/A 03/05/2023   Procedure: disc aspiration;  Surgeon: de Macedo Rodrigues, Katyucia, MD;  Location: [facility] OR;  Service: Radiology;  Laterality: N/A;   TEE WITHOUT CARDIOVERSION N/A 02/05/2021   Procedure: TRANSESOPHAGEAL ECHOCARDIOGRAM (TEE);  Surgeon: Shlomo Wilbert SAUNDERS, MD;  Location: [facility] ENDOSCOPY;  Service: Cardiovascular;  Laterality: N/A;   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   TOTAL SHOULDER ARTHROPLASTY Right 02/10/2021   Procedure: RIGHT OPEN SHOULDER I & D, OPEN DISTAL CLAVICLE RESECTION;  Surgeon: Josefina Chew, MD;  Location: [facility];  Service: Orthopedics;  Laterality: Right;   VASECTOMY      Social History   Tobacco Use   Smoking status: Former    Current packs/day: 0.00    Average packs/day: 2.0 packs/day for 15.0 years (30.0 ttl pk-yrs)    Types:  Cigarettes    Start date: 49    Quit date: 2012    Years since quitting: 13.9    Passive exposure: Never   Smokeless tobacco: Never  Vaping Use   Vaping status: Never Used  Substance Use Topics   Alcohol use: Yes    Comment: occasional   Drug use: No    Family History  Problem Relation Age of Onset   Cancer Mother    Colon cancer Neg Hx     Allergies  Allergen Reactions   Tizanidine  Other (See Comments)    Leg cramps    Health Maintenance  Topic Date Due   Medicare Annual Wellness (AWV)  Never done   OPHTHALMOLOGY EXAM  11/17/2022   DTaP/Tdap/Td (2 - Td or Tdap) 08/14/2024   Diabetic kidney evaluation -  Urine ACR  09/10/2024   COVID-19 Vaccine (3 - Moderna risk series) 08/25/2024 (Originally 02/14/2020)   Zoster Vaccines- Shingrix (1 of 2) 11/07/2024 (Originally 06/26/1986)   Influenza Vaccine  11/23/2024 (Originally 03/26/2024)   Lung Cancer Screening  02/05/2025 (Originally 12/03/2023)   Fecal DNA (Cologuard)  02/05/2025 (Originally 11/03/2023)   Pneumococcal Vaccine: 50+ Years (2 of 2 - PCV) 08/09/2025 (Originally 06/23/2021)   FOOT EXAM  09/10/2024   HEMOGLOBIN A1C  02/07/2025   Diabetic kidney evaluation - eGFR measurement  08/09/2025   Hepatitis B Vaccines 19-59 Average Risk  Completed   Hepatitis C Screening  Completed   HIV Screening  Completed   HPV VACCINES  Aged Out   Meningococcal B Vaccine  Aged Out    Objective: BP (!) 188/101   Pulse 71   Temp 97.9 F (36.6 C) (Oral)   SpO2 97%   Physical Exam Constitutional:  sitting in the wheelchair  HENT:     Head: Normocephalic and atraumatic.      Mouth: Mucous membranes are moist.  Eyes:    Conjunctiva/sclera: Conjunctivae normal.     Pupils:   Cardiovascular:     Rate and Rhythm: Normal rate and regular rhythm.     Heart sounds:  Pulmonary:     Effort: Pulmonary effort is normal.     Breath sounds:   Abdominal:     General: Non distended     Palpations:    Musculoskeletal:        General: sitting in a wheel chair and able to walk few steps with a cane  Skin:    General: Skin is warm and dry.   Neurological:     General:     Mental Status: awake, alert and oriented to person, place, and time.   Psychiatric:        Mood and Affect: Mood normal.   Lab Results Lab Results  Component Value Date   WBC 7.7 08/09/2024   HGB 13.0 08/09/2024   HCT 39.0 08/09/2024   MCV 89 08/09/2024   PLT 320 08/09/2024    Lab Results  Component Value Date   CREATININE 0.77 08/09/2024   BUN 8 08/09/2024   NA 137 08/09/2024   K 5.4 (H) 08/09/2024   CL 99 08/09/2024   CO2 23 08/09/2024    Lab Results  Component Value Date   ALT 16 08/09/2024   AST 19 08/09/2024   ALKPHOS 95 08/09/2024   BILITOT 0.4 08/09/2024    Lab Results  Component Value Date   CHOL 145 09/11/2023   HDL 59 09/11/2023   LDLCALC 68 09/11/2023   TRIG 100 09/11/2023   CHOLHDL 2.5 09/11/2023   No results found for: LABRPR, RPRTITER No results found for: HIV1RNAQUANT, HIV1RNAVL, CD4TABS   Microbiology  Results for orders placed or performed during the hospital encounter of 02/22/23  Culture, blood (Routine X 2) w Reflex to ID Panel     Status: None   Collection Time: 02/23/23  7:38 AM   Specimen: BLOOD  Result Value Ref Range Status   Specimen Description BLOOD SITE NOT SPECIFIED  Final   Special Requests   Final    BOTTLES DRAWN AEROBIC AND ANAEROBIC Blood Culture adequate volume   Culture   Final    NO GROWTH 5 DAYS Performed at [facility], [address] [place], [place] [address]    Report Status 02/28/2023 FINAL  Final  Culture, blood (Routine X 2) w Reflex to ID Panel  Status: None   Collection Time: 02/23/23  7:40 AM   Specimen: BLOOD  Result Value Ref Range Status   Specimen Description BLOOD SITE NOT SPECIFIED  Final   Special Requests   Final    BOTTLES DRAWN AEROBIC AND ANAEROBIC Blood Culture adequate volume   Culture   Final     NO GROWTH 5 DAYS Performed at [facility], [address], [place], [place] [address]    Report Status 02/28/2023 FINAL  Final  Aerobic/Anaerobic Culture w Gram Stain (surgical/deep wound)     Status: None   Collection Time: 03/05/23 10:04 AM   Specimen: Fine Needle Aspirate  Result Value Ref Range Status   Specimen Description NEEDLE ASPIRATE  Final   Special Requests NONE  Final   Gram Stain   Final    FEW WBC PRESENT, PREDOMINANTLY PMN NO ORGANISMS SEEN    Culture   Final    No growth aerobically or anaerobically. Performed at [facility], [address], [place], [place] [address]    Report Status 03/10/2023 FINAL  Final  Aerobic/Anaerobic Culture w Gram Stain (surgical/deep wound)     Status: None   Collection Time: 03/05/23 10:07 AM   Specimen: Fine Needle Aspirate  Result Value Ref Range Status   Specimen Description NEEDLE ASPIRATE  Final   Special Requests  IDISC  Final   Gram Stain   Final    RARE WBC PRESENT, PREDOMINANTLY PMN NO ORGANISMS SEEN    Culture   Final    No growth aerobically or anaerobically. Performed at [facility], [address] [place], [place] [address]    Report Status 03/10/2023 FINAL  Final  Acid Fast Smear (AFB)     Status: None   Collection Time: 03/05/23 10:09 AM   Specimen: Intervertebral Disc; Fine Needle Aspirate  Result Value Ref Range Status   AFB Specimen Processing Concentration  Final   Acid Fast Smear Negative  Final    Comment: (NOTE) Performed At: [facility] [address] [place], [place] [id number] Jennette Shorter MD Ey:[id number]    Source (AFB) NEEDLE ASPIRATE  Final    Comment: Performed at [facility], [address], [place], [place] [address]  Acid Fast Culture with reflexed sensitivities     Status: None   Collection Time: 03/05/23 10:09 AM   Specimen: Intervertebral Disc; Fine Needle Aspirate  Result Value Ref Range Status   Acid Fast Culture Negative  Final     Comment: (NOTE) No acid fast bacilli isolated after 6 weeks. Performed At: [facility] [address] [place], [place] [id number] Jennette Shorter MD Ey:[id number]    Source of Sample NEEDLE ASPIRATE  Final    Comment: Performed at [facility], [address] [place], [place] [address]  Fungus Culture With Stain     Status: None   Collection Time: 03/05/23 10:09 AM   Specimen: Intervertebral Disc  Result Value Ref Range Status   Fungus Stain Final report  Final   Fungus (Mycology) Culture Final report  Final    Comment: (NOTE) Performed At: [facility] [address] [place], [place] [id number] Jennette Shorter MD Ey:[id number]    Fungal Source NEEDLE ASPIRATE  Final    Comment: Performed at [facility], [address], [place], [place] [address]  Fungus Culture Result     Status: None   Collection Time: 03/05/23 10:09 AM  Result Value Ref Range Status  Result 1 Comment  Final    Comment: (NOTE) KOH/Calcofluor preparation:  no fungus observed. Performed At: [facility] [address] [place], [place] [id number] Jennette Shorter MD Ey:[id number]   Fungal organism reflex     Status: None   Collection Time: 03/05/23 10:09 AM  Result Value Ref Range Status   Fungal result 1 Comment  Final    Comment: (NOTE) No yeast or mold isolated after 4 weeks. Performed At: [facility] [address] [place], [place] [id number] Jennette Shorter MD Ey:[id number]   Fungus Culture With Stain     Status: None   Collection Time: 03/05/23 12:25 PM  Result Value Ref Range Status   Fungus Stain Final report  Final   Fungus (Mycology) Culture Final report  Final    Comment: (NOTE) Performed At: [facility] [address] [place], [place] [id number] Jennette Shorter MD Ey:[id number]    Fungal Source NEEDLE ASPIRATE  Final    Comment: Performed at [facility], [address] [place], [place] [address]  Acid Fast Smear (AFB)      Status: None   Collection Time: 03/05/23 12:25 PM   Specimen: Intervertebral Disc; Fine Needle Aspirate  Result Value Ref Range Status   AFB Specimen Processing Concentration  Final   Acid Fast Smear Negative  Final    Comment: (NOTE) Performed At: [facility] [address] [place], [place] [id number] Jennette Shorter MD Ey:[id number]    Source (AFB) NEEDLE ASPIRATE  Final    Comment: Performed at [facility], [address], [place], [place] [address]  Acid Fast Culture with reflexed sensitivities     Status: None   Collection Time: 03/05/23 12:25 PM   Specimen: Intervertebral Disc; Fine Needle Aspirate  Result Value Ref Range Status   Acid Fast Culture Negative  Final    Comment: (NOTE) No acid fast bacilli isolated after 6 weeks. Performed At: [facility] [address] [place], [place] [id number] Jennette Shorter MD Ey:[id number]    Source of Sample NEEDLE ASPIRATE  Final    Comment: Performed at [facility], [address], [place], [place] [address]  Fungus Culture Result     Status: None   Collection Time: 03/05/23 12:25 PM  Result Value Ref Range Status   Result 1 Comment  Final    Comment: (NOTE) KOH/Calcofluor preparation:  no fungus observed. Performed At: [facility] [address] [place], [place] [id number] Jennette Shorter MD Ey:[id number]   Fungal organism reflex     Status: None   Collection Time: 03/05/23 12:25 PM  Result Value Ref Range Status   Fungal result 1 Comment  Final    Comment: (NOTE) No yeast or mold isolated after 4 weeks. Performed At: [facility] [address] [place], [place] [id number] Jennette Shorter MD Ey:[id number]    Imaging MRI L spine 02/25/23 IMPRESSION: 1. Findings consistent with discitis/osteomyelitis of the lower thoracic spine, progressed since prior thoracic MRI (September 2023). The T11-12 level, which was included on prior MRI of the lumbar spine (May 2024) appear  stable. 2. Phlegmon surrounding the thecal sac results in moderate narrowing at T10-11 and severe at T11-12, resulting in mass effect on the cord without cord signal abnormality. 3. Interval resolution of ventral epidural fluid collection at the L1 level. 4. Degenerative changes of the thoracic spine with moderate spinal canal stenosis at T5-6. 5. Degenerative changes of the lumbar spine with moderate bilateral neural foraminal narrowing at L1-2 and  L2-3 and moderate left neural foraminal narrowing at L5-S1.   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  -continue ciprofloxacin  500mg  po bid, plan to stop once repeat MRI stable. He is going to contact neurosurgery office for scheduling.  -Fu in 1 month  # Medication Monitoring  -Labs today -10/07/23 EKG  qtc 421  -Have previously discussed side effects of fluoroquinolones at length inlcuding arthropathy/arthralgia, CNS effects, C diff infection, glucose dysregulation, hepatotoxicity, aortic aneurysm. Discussed to fu with PCP if needs screening sooner than 65 years for Aortic Aneurysm since on prolonged ciprofloxacin    I spent 30 minutes involved in face-to-face and non-face-to-face activities for this patient on the day of the visit. Professional time spent includes the following activities: Preparing to see the patient (review of tests),  Performing a medically appropriate examination and evaluation, documenting clinical information in the EMR, Independently interpreting results (not separately reported), Communicating results to the patient/wife, Counseling and educating the patient/wife and Care coordination (not separately reported).   Annalee Joseph, MD [facility] 08/16/2024, 10:58 AM  "

## 2024-08-17 ENCOUNTER — Other Ambulatory Visit (HOSPITAL_COMMUNITY): Payer: Self-pay

## 2024-08-18 ENCOUNTER — Other Ambulatory Visit (HOSPITAL_COMMUNITY): Payer: Self-pay

## 2024-08-20 ENCOUNTER — Other Ambulatory Visit (HOSPITAL_COMMUNITY): Payer: Self-pay

## 2024-08-31 ENCOUNTER — Other Ambulatory Visit (HOSPITAL_COMMUNITY): Payer: Self-pay

## 2024-09-01 ENCOUNTER — Other Ambulatory Visit (HOSPITAL_COMMUNITY): Payer: Self-pay

## 2024-09-06 ENCOUNTER — Other Ambulatory Visit (HOSPITAL_COMMUNITY): Payer: Self-pay

## 2024-09-06 ENCOUNTER — Telehealth: Payer: Self-pay

## 2024-09-06 NOTE — Telephone Encounter (Signed)
 Pharmacy Patient Advocate Encounter   Received notification from [facility] KEY that prior authorization for FreeStyle Libre 3 Plus Sensor  is required/requested.   Insurance verification completed.   The patient is insured through [facility] MEDIC[employer].   Per test claim: Atttempted to submit PA. Per insurance: [employer] is not able to process this request through ePA, please contact the plan at [phone] or fax in request to [phone].

## 2024-09-09 ENCOUNTER — Other Ambulatory Visit (HOSPITAL_COMMUNITY): Payer: Self-pay

## 2024-09-09 NOTE — Telephone Encounter (Signed)
 [employer] rx, PA form being faxed to [phone]

## 2024-09-15 ENCOUNTER — Other Ambulatory Visit: Payer: Self-pay

## 2024-09-15 ENCOUNTER — Other Ambulatory Visit (HOSPITAL_COMMUNITY): Payer: Self-pay

## 2024-09-15 MED ORDER — BUPRENORPHINE 10 MCG/HR TD PTWK
1.0000 | MEDICATED_PATCH | TRANSDERMAL | 0 refills | Status: AC
  Filled 2024-09-15: qty 4, 28d supply, fill #0

## 2024-09-15 MED ORDER — HYDROXYZINE HCL 10 MG PO TABS
10.0000 mg | ORAL_TABLET | Freq: Three times a day (TID) | ORAL | 2 refills | Status: AC
  Filled 2024-09-15: qty 90, 30d supply, fill #0

## 2024-09-15 MED ORDER — OXYCODONE-ACETAMINOPHEN 10-325 MG PO TABS
1.0000 | ORAL_TABLET | Freq: Every day | ORAL | 0 refills | Status: AC
  Filled 2024-09-15 – 2024-09-17 (×2): qty 150, 30d supply, fill #0

## 2024-09-15 MED ORDER — BACLOFEN 5 MG PO TABS
5.0000 mg | ORAL_TABLET | Freq: Three times a day (TID) | ORAL | 2 refills | Status: AC
  Filled 2024-09-15: qty 90, 30d supply, fill #0

## 2024-09-15 MED ORDER — LIDOCAINE 5 % EX OINT
TOPICAL_OINTMENT | Freq: Three times a day (TID) | CUTANEOUS | 0 refills | Status: AC
  Filled 2024-09-15: qty 50, 43d supply, fill #0

## 2024-09-16 ENCOUNTER — Other Ambulatory Visit (HOSPITAL_COMMUNITY): Payer: Self-pay

## 2024-09-16 NOTE — Telephone Encounter (Signed)
 See 09/06/24 telephone note.  Will f/u with patient.

## 2024-09-16 NOTE — Telephone Encounter (Signed)
 Hey Team!  Looks like patients Freestyle Herlene is requiring PA. Can someone work on this please! Thank you!

## 2024-09-16 NOTE — Telephone Encounter (Signed)
 Never received PA form.   Contacted insurance. Form being refaxed.

## 2024-09-17 ENCOUNTER — Other Ambulatory Visit (HOSPITAL_COMMUNITY): Payer: Self-pay

## 2024-09-17 ENCOUNTER — Other Ambulatory Visit: Payer: Self-pay

## 2024-09-17 NOTE — Telephone Encounter (Signed)
 Pharmacy Patient Advocate Encounter   PA required; PA submitted to above mentioned insurance via Fax. Key/confirmation #/EOC [id number]. Status is pending.

## 2024-09-20 NOTE — Telephone Encounter (Signed)
 Pharmacy Patient Advocate Encounter  Received notification from [employer] [facility] that Prior Authorization for FREESTYLE LIBRE 3 PLUS SENSOR has been DENIED.  Full denial letter will be uploaded to the media tab. See denial reason below.

## 2024-09-23 NOTE — Telephone Encounter (Signed)
 Pt has had over three episodes of hypoglycemia. Please re-file PA

## 2024-09-24 NOTE — Telephone Encounter (Signed)
 Resubmitted PA fax to insurance with note of 3+ episodes of hypoglycemia.

## 2024-09-28 NOTE — Telephone Encounter (Signed)
 Pharmacy Patient Advocate Encounter  Received notification from [facility] MEDICARE that Prior Authorization for FREESTYLE LIBRE 3+ SENSOR has been APPROVED from 09/24/24 to 09/24/25   PA #/Case ID/Reference #: [id number]

## 2024-09-28 NOTE — Telephone Encounter (Signed)
 Thank you :)

## 2024-11-09 ENCOUNTER — Ambulatory Visit: Payer: Self-pay | Admitting: Infectious Diseases

## 2024-12-09 ENCOUNTER — Ambulatory Visit: Admitting: Family
